# Patient Record
Sex: Female | Born: 1940
Health system: Southern US, Community
[De-identification: ages and names within clinical notes are randomized; demographics above are authoritative.]

## PROBLEM LIST (undated history)

## (undated) DIAGNOSIS — I2510 Atherosclerotic heart disease of native coronary artery without angina pectoris: Secondary | ICD-10-CM

## (undated) DIAGNOSIS — C801 Malignant (primary) neoplasm, unspecified: Secondary | ICD-10-CM

## (undated) DIAGNOSIS — M719 Bursopathy, unspecified: Secondary | ICD-10-CM

## (undated) DIAGNOSIS — I779 Disorder of arteries and arterioles, unspecified: Secondary | ICD-10-CM

## (undated) DIAGNOSIS — R112 Nausea with vomiting, unspecified: Secondary | ICD-10-CM

## (undated) DIAGNOSIS — I739 Peripheral vascular disease, unspecified: Secondary | ICD-10-CM

## (undated) DIAGNOSIS — E039 Hypothyroidism, unspecified: Secondary | ICD-10-CM

## (undated) DIAGNOSIS — I10 Essential (primary) hypertension: Secondary | ICD-10-CM

## (undated) DIAGNOSIS — Z7189 Other specified counseling: Secondary | ICD-10-CM

## (undated) DIAGNOSIS — E785 Hyperlipidemia, unspecified: Secondary | ICD-10-CM

## (undated) DIAGNOSIS — C799 Secondary malignant neoplasm of unspecified site: Secondary | ICD-10-CM

## (undated) DIAGNOSIS — Z9289 Personal history of other medical treatment: Secondary | ICD-10-CM

## (undated) DIAGNOSIS — Z9889 Other specified postprocedural states: Secondary | ICD-10-CM

## (undated) DIAGNOSIS — C539 Malignant neoplasm of cervix uteri, unspecified: Secondary | ICD-10-CM

## (undated) DIAGNOSIS — J45909 Unspecified asthma, uncomplicated: Secondary | ICD-10-CM

## (undated) DIAGNOSIS — M1990 Unspecified osteoarthritis, unspecified site: Secondary | ICD-10-CM

## (undated) HISTORY — DX: Hyperlipidemia, unspecified: E78.5

## (undated) HISTORY — DX: Essential (primary) hypertension: I10

## (undated) HISTORY — DX: Disorder of arteries and arterioles, unspecified: I77.9

## (undated) HISTORY — PX: TUBAL LIGATION: SHX77

## (undated) HISTORY — DX: Atherosclerotic heart disease of native coronary artery without angina pectoris: I25.10

## (undated) HISTORY — DX: Peripheral vascular disease, unspecified: I73.9

## (undated) HISTORY — PX: ABDOMINAL HYSTERECTOMY: SHX81

## (undated) HISTORY — DX: Personal history of other medical treatment: Z92.89

## (undated) HISTORY — PX: TONSILLECTOMY: SUR1361

---

## 1997-08-28 ENCOUNTER — Ambulatory Visit (HOSPITAL_COMMUNITY): Admission: RE | Admit: 1997-08-28 | Discharge: 1997-08-28 | Payer: Self-pay | Admitting: Internal Medicine

## 1999-08-05 ENCOUNTER — Ambulatory Visit (HOSPITAL_COMMUNITY): Admission: RE | Admit: 1999-08-05 | Discharge: 1999-08-05 | Payer: Self-pay | Admitting: Internal Medicine

## 2000-11-25 ENCOUNTER — Ambulatory Visit (HOSPITAL_COMMUNITY): Admission: RE | Admit: 2000-11-25 | Discharge: 2000-11-25 | Payer: Self-pay | Admitting: Gastroenterology

## 2001-01-17 ENCOUNTER — Encounter: Payer: Self-pay | Admitting: Internal Medicine

## 2001-01-17 ENCOUNTER — Ambulatory Visit (HOSPITAL_COMMUNITY): Admission: RE | Admit: 2001-01-17 | Discharge: 2001-01-17 | Payer: Self-pay | Admitting: Internal Medicine

## 2002-01-09 ENCOUNTER — Other Ambulatory Visit: Admission: RE | Admit: 2002-01-09 | Discharge: 2002-01-09 | Payer: Self-pay | Admitting: Obstetrics and Gynecology

## 2002-02-07 ENCOUNTER — Encounter: Payer: Self-pay | Admitting: Emergency Medicine

## 2002-02-07 ENCOUNTER — Emergency Department (HOSPITAL_COMMUNITY): Admission: EM | Admit: 2002-02-07 | Discharge: 2002-02-07 | Payer: Self-pay | Admitting: Emergency Medicine

## 2004-02-21 ENCOUNTER — Ambulatory Visit (HOSPITAL_COMMUNITY): Admission: RE | Admit: 2004-02-21 | Discharge: 2004-02-21 | Payer: Self-pay | Admitting: Gastroenterology

## 2004-03-12 ENCOUNTER — Inpatient Hospital Stay (HOSPITAL_COMMUNITY): Admission: RE | Admit: 2004-03-12 | Discharge: 2004-03-19 | Payer: Self-pay | Admitting: Cardiothoracic Surgery

## 2004-03-12 HISTORY — PX: CORONARY ARTERY BYPASS GRAFT: SHX141

## 2004-03-13 ENCOUNTER — Encounter (INDEPENDENT_AMBULATORY_CARE_PROVIDER_SITE_OTHER): Payer: Self-pay | Admitting: Cardiology

## 2004-03-22 ENCOUNTER — Inpatient Hospital Stay (HOSPITAL_COMMUNITY): Admission: EM | Admit: 2004-03-22 | Discharge: 2004-03-28 | Payer: Self-pay | Admitting: Emergency Medicine

## 2004-04-20 ENCOUNTER — Encounter (HOSPITAL_COMMUNITY): Admission: RE | Admit: 2004-04-20 | Discharge: 2004-07-19 | Payer: Self-pay | Admitting: Cardiology

## 2004-06-24 ENCOUNTER — Ambulatory Visit (HOSPITAL_COMMUNITY): Admission: RE | Admit: 2004-06-24 | Discharge: 2004-06-24 | Payer: Self-pay | Admitting: Gastroenterology

## 2004-06-24 ENCOUNTER — Encounter (INDEPENDENT_AMBULATORY_CARE_PROVIDER_SITE_OTHER): Payer: Self-pay | Admitting: Specialist

## 2007-04-01 HISTORY — PX: TRANSTHORACIC ECHOCARDIOGRAM: SHX275

## 2009-05-07 ENCOUNTER — Encounter: Payer: Self-pay | Admitting: Internal Medicine

## 2009-05-07 ENCOUNTER — Ambulatory Visit: Payer: Self-pay | Admitting: Vascular Surgery

## 2009-05-07 ENCOUNTER — Ambulatory Visit (HOSPITAL_COMMUNITY): Admission: RE | Admit: 2009-05-07 | Discharge: 2009-05-07 | Payer: Self-pay | Admitting: Internal Medicine

## 2009-06-02 ENCOUNTER — Emergency Department (HOSPITAL_COMMUNITY): Admission: EM | Admit: 2009-06-02 | Discharge: 2009-06-02 | Payer: Self-pay | Admitting: Family Medicine

## 2010-03-14 ENCOUNTER — Ambulatory Visit: Payer: Self-pay | Admitting: Vascular Surgery

## 2010-03-14 ENCOUNTER — Emergency Department (HOSPITAL_COMMUNITY): Admission: EM | Admit: 2010-03-14 | Discharge: 2010-03-14 | Payer: Self-pay | Admitting: Emergency Medicine

## 2010-06-03 ENCOUNTER — Ambulatory Visit (HOSPITAL_COMMUNITY)
Admission: RE | Admit: 2010-06-03 | Discharge: 2010-06-03 | Payer: Self-pay | Source: Home / Self Care | Attending: Internal Medicine | Admitting: Internal Medicine

## 2010-06-20 ENCOUNTER — Encounter: Payer: Self-pay | Admitting: Cardiothoracic Surgery

## 2010-10-05 ENCOUNTER — Encounter: Payer: Self-pay | Admitting: Internal Medicine

## 2010-10-05 ENCOUNTER — Other Ambulatory Visit: Payer: Self-pay | Admitting: Internal Medicine

## 2010-10-05 ENCOUNTER — Encounter: Payer: Self-pay | Admitting: *Deleted

## 2010-10-05 DIAGNOSIS — K209 Esophagitis, unspecified without bleeding: Secondary | ICD-10-CM | POA: Insufficient documentation

## 2010-10-05 DIAGNOSIS — K2940 Chronic atrophic gastritis without bleeding: Secondary | ICD-10-CM | POA: Insufficient documentation

## 2010-10-05 DIAGNOSIS — E039 Hypothyroidism, unspecified: Secondary | ICD-10-CM | POA: Insufficient documentation

## 2010-10-05 DIAGNOSIS — R51 Headache: Secondary | ICD-10-CM

## 2010-10-05 DIAGNOSIS — I10 Essential (primary) hypertension: Secondary | ICD-10-CM | POA: Insufficient documentation

## 2010-10-05 DIAGNOSIS — T1490XA Injury, unspecified, initial encounter: Secondary | ICD-10-CM

## 2010-10-06 ENCOUNTER — Other Ambulatory Visit: Payer: Self-pay | Admitting: Internal Medicine

## 2010-10-06 ENCOUNTER — Ambulatory Visit (HOSPITAL_COMMUNITY)
Admission: RE | Admit: 2010-10-06 | Discharge: 2010-10-06 | Disposition: A | Discharge status: Home / Self Care | Payer: Medicare Other | Source: Ambulatory Visit | Attending: Internal Medicine | Admitting: Internal Medicine

## 2010-10-06 DIAGNOSIS — R51 Headache: Secondary | ICD-10-CM

## 2010-10-06 DIAGNOSIS — G319 Degenerative disease of nervous system, unspecified: Secondary | ICD-10-CM | POA: Insufficient documentation

## 2010-10-06 DIAGNOSIS — T1490XA Injury, unspecified, initial encounter: Secondary | ICD-10-CM

## 2010-10-06 DIAGNOSIS — W19XXXA Unspecified fall, initial encounter: Secondary | ICD-10-CM | POA: Insufficient documentation

## 2010-10-06 DIAGNOSIS — S0003XA Contusion of scalp, initial encounter: Secondary | ICD-10-CM | POA: Insufficient documentation

## 2010-10-06 DIAGNOSIS — S0083XA Contusion of other part of head, initial encounter: Secondary | ICD-10-CM | POA: Insufficient documentation

## 2010-10-16 NOTE — Discharge Summary (Signed)
Shannon Scott, Shannon Scott             ACCOUNT NO.:  [id number]   MEDICAL RECORD NO.:  [id number]          PATIENT TYPE:  INP   LOCATION:  2008                         FACILITY:  [facility]   PHYSICIAN:  Octavia Heir, MD  DATE OF BIRTH:  August 13, 1940   DATE OF ADMISSION:  03/22/2004  DATE OF DISCHARGE:  03/28/2004                                 DISCHARGE SUMMARY   ADMISSION DIAGNOSES:  1.  Chest pain with shortness of breath, status post coronary artery bypass      grafting x 3 on March 05, 2004.  2.  Rule out postoperative pericarditis.  3.  Hyperlipidemia.  4.  Hypothyroidism.  5.  Hypokalemia.  6.  Hypertension.   DISCHARGE DIAGNOSES:  1.  Chest pain with shortness of breath, status post coronary artery bypass      grafting x 3 on March 05, 2004.  2.  Rule out postoperative pericarditis.  3.  Hyperlipidemia.  4.  Hypothyroidism.  5.  Hypokalemia.  6.  Hypertension.   PROCEDURE:  Cardiac catheterization March 24, 2004.   CONSULTATIONS:  Nelwyn Salisbury, M.D., GI service.   HISTORY OF PRESENT ILLNESS:  The patient is a 70 year old white female who  underwent coronary artery bypass grafting x 3 with left internal mammary  artery to the LAD, saphenous vein graft to the OM, and saphenous vein graft  to the PD.  She had postoperative atrial fibrillation which resolved with  beta blockers.  Postoperative course showed some ST elevation and some  pericarditis.  She was ultimately discharged home on March 12, 2004.   She returned on the day of admission complaining of shortness of breath,  dyspnea on exertion, chest pain, dizziness, and unsteady at home.  In the  ER, her saturations were 97%.  Her blood pressure was stable.  EKG showed  some lateral T-wave changes.  She was admitted to rule out MI and further  treatment as indicated.   MEDICATIONS:  1.  Aspirin 325 mg q.d.  2.  Toprol XL 25 mg q.d.  3.  Crestor 10 mg q.d.  4.  Protonix 40 mg q.d.  5.  Synthroid 25 mcg  q.d.  6.  Welchol 2 tablets b.i.d.  7.  Ultram p.r.n.   ALLERGIES:  None.   SOCIAL HISTORY:  She is widowed.  One son.  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  She  does not use tobacco or alcohol.  Further history and physical, please see  the dictated note.   HOSPITAL COURSE:  The patient was admitted.  Her enzymes were negative.  Her  labs were stable except for some hypokalemia and that was replaced.  Because  she had T-wave changes, she underwent cardiac catheterization on March 24, 2004.  This showed all the grafts including left internal mammary artery to  the LAD, saphenous vein graft to the circumflex, and saphenous vein graft to  the PD were patent.  EF was 60%.   The patient made slow progress.  She had good deal of GI upset and was seen  in consultation by Dr. Nelwyn Salisbury.  It was ultimately determined she  was constipated.  She had previously undergone abdominal ultrasound and HIDA  scan in September which were negative.  Her constipation was resolved.  Dr.  Nelwyn Salisbury recommended increasing her fiber intake and increased her  Protonix to 40 mg b.i.d.  She also recommended Metamucil, Benefiber, or  Citrucel, along with liberal fluid intake.   By the morning of March 28, 2004, she was doing well.  Her GI symptoms had  pretty much resolved.  She was concern about her appetite but was encouraged  to eat whatever she felt like, push proteins, and that her appetite would  improve with time.  She was seen in the morning of March 28, 2004 by Dr.  Gatha Mayer and Dr. Leslye Peer.  They were in agreement.  She was  subsequently discharged home.   DISCHARGE MEDICATIONS:  1.  Aspirin 81 mg q.d.  2.  Toprol XL 25 mg b.i.d.  3.  Crestor 10 mg q.d.  4.  Protonix 40 mg b.i.d.  5.  Synthroid 0.25 mg q.d.  6.  Welchol 2 tablets b.i.d.  7.  Benicar 20 mg q.d.   FOLLOW UP:  She will return on April 09, 2004 to see Dr. Eden Lathe. [place] at  3:45.  Follow up with Dr. Nelwyn Salisbury to be scheduled.   LABORATORY DATA:  Hemoglobin is 7.5, hematocrit is 30, white count is 6.2,  platelets are 500,000.  Sodium is 140, potassium is 3.6, chloride is 107,  CO2 is 25, BUN 4, creatinine is 0.8, glucose is 0.7.  Troponin, CK and CK-  MBs were negative.  Amylase was 37, lipase was 26.  LFTs were normal.   CONDITION ON DISCHARGE:  Improved.      Will   WDJ/MEDQ  D:  03/28/2004  T:  03/28/2004  Job:  [id number]   cc:   Nelwyn Salisbury, M.D.  [address]  [address] [place] [address]  Fax: [phone]   Len Childs, M.D.  [address]  [place]  [place] [address]   Jeanann Lewandowsky, M.D.  [address], [place] [address]  Fax: [phone]   Eden Lathe. Einar Gip, [place] [address] [place]  [place] [address]  Fax: [phone]

## 2010-10-16 NOTE — Procedures (Signed)
[place]. [facility]  Patient:    Shannon Scott, Shannon Scott                    MRN: [id number] Proc. Date: 11/25/00 Adm. Date:  [id number] Attending:  Juanita Craver CC:         Don Broach. Carlis Abbott, M.D.   Procedure Report  DATE OF BIRTH: 09/10/1940  PROCEDURE: Colonoscopy.  ENDOSCOPIST: Nelwyn Salisbury, M.D.  INSTRUMENT USED: Olympus video colonoscope.  INDICATIONS FOR PROCEDURE: Trace guaiac positive stools in a 70 year old female.  Rule out colonic polyps, masses, hemorrhoids, etc.  PREPROCEDURE PREPARATION: Informed consent was procured from the patient and the patient was fasted for eight hours prior to the procedure, and prepped with a bottle of magnesium citrate and a gallon of NuLytely the night prior to the procedure.  PREPROCEDURE PHYSICAL EXAMINATION:  VITAL SIGNS: Stable.  NECK: Supple.  CHEST: Clear to auscultation.  CARDIAC: S1 and S2, regular.  No murmurs, rubs, or gallops.  ABDOMEN Soft, with normal bowel sounds.  Epigastric tenderness on palpation with guarding.  No rebound or rigidity.  DESCRIPTION OF PROCEDURE: The patient was placed in the left lateral decubitus position and sedated with Demerol and Versed for the EGD.  No additional sedation was used for colonoscopy.  Once the patient was adequately sedated and maintained on low-flow oxygen and continuous cardiac monitoring the Olympus video colonoscope was advanced from the rectum to the cecum without difficulty.  The appendiceal orifice and ileocecal valve were clearly visualized and photographed.  Small internal hemorrhoids were appreciated on retroflexion in the rectum.  There was some early left-sided diverticulosis. No polyps or masses were seen.  IMPRESSION:  1. Small nonbleeding internal hemorrhoids.  2. Early left-sided diverticular disease.  RECOMMENDATIONS:  1. High-fiber diet has been recommended with liberal fluid intake.  2. Repeat guaiac testing will be done on  an outpatient basis.  3. Further recommendations will be made as warranted. DD:  11/25/00 TD:  11/26/00 Job: [id number]

## 2010-10-16 NOTE — Op Note (Signed)
NAMENATALYAH, CUMMISKEY NO.:  [id number]   MEDICAL RECORD NO.:  [id number]          PATIENT TYPE:  INP   LOCATION:  2303                         FACILITY:  [facility]   PHYSICIAN:  Tharon Aquas Trigt III, M.D.DATE OF BIRTH:  May 15, 1941   DATE OF PROCEDURE:  03/12/2004  DATE OF DISCHARGE:                                 OPERATIVE REPORT   PREOPERATIVE DIAGNOSIS:  Class 3 exertional angina with severe three-vessel  coronary artery disease.   POSTOPERATIVE DIAGNOSIS:  Class 3 exertional angina with severe three-vessel  coronary artery disease.   OPERATION PERFORMED:  Coronary artery bypass grafting times three (left  internal mammary artery to the left anterior descending, saphenous vein  graft to circumflex marginal, saphenous vein graft to posterior descending)   SURGEON:  Ivin Poot, M.D.   ASSISTANT:  Jacinta Shoe, P.A.   ANESTHESIA:  General.   ANESTHESIOLOGIST:  Finis Bud, M.D.   INDICATIONS FOR PROCEDURE:  The patient is a 70 year old female with  exertional shortness of breath and chest tightness.  Although she had a  normal Cardiolite stress test two years ago, it was felt her symptoms were  consistent with angina and she had significant risk factors including  hyperlipidemia, obesity and family history.  Dr. Einar Gip performed diagnostic  cardiac catheterization which demonstrated severe three-vessel disease with  preserved left ventricular function.  She is felt to be a candidate for  surgical revascularization.   Prior to surgery, I examined the patient in the office and reviewed the  results of the cardiac catheterization with the patient and her family.  I  discussed the indications and expected benefits of coronary artery bypass  grafting surgery for treatment of her coronary artery disease.  I reviewed  the alternatives to surgical therapy for treatment of her coronary artery  disease and the expected outcome of those alternative  therapies.  I reviewed  with the patient the major aspects of the planned procedure including the  choice of conduit to include the mammary artery and saphenous vein, the  location of the surgical incisions, the use of general anesthesia and  cardiopulmonary bypass and the expected postoperative hospital recovery.  I  reviewed with the patient the risks to her of coronary artery bypass surgery  including the risks of MI, CVA, bleeding, wound infection, and death.  She  understood these implications for the surgery and agreed to proceed with the  operation as planned under what I felt was an informed consent.   OPERATIVE FINDINGS:  The patient's coronaries were very small.  She would  not be a candidate for redo bypass surgery.  The mammary artery was very  small and was trimmed proximally in order to provide a conduit with adequate  size and blood flow.  The patient was given one unit of packed cells during  the pump run for a hemoglobin of 7 g.   DESCRIPTION OF PROCEDURE:  The patient was brought to the operating room and  placed supine on the operating table where general anesthesia was induced  under invasive hemodynamic monitoring.  The chest, abdomen and legs  were  prepped with Betadine and draped as a sterile field.  A sternal incision was  made as the saphenous vein was harvested from the right thigh using the  endoscopic technique.  The left internal mammary artery was harvested as a  pedicle graft from its origin at the subclavian vessels and was a good  vessel but small with excellent flow.  Heparin was administered and ACT was  documented as being therapeutic.  The sternal retractor was placed.  The  pericardium was opened and suspended.  Pursestrings were placed in the  ascending aorta and right atrium and the patient was cannulated and placed  on bypass.  The coronaries were identified for grafting.  The distal right  coronary was too heavily diseased to graft.  The posterior  descending was  small but graftable.  The posterolateral vessel was too small to graft.  The  LAD was intramyocardial except for its distal most aspect where it was  adequate size for grafting.  The diagonal was too small to graft.  The  circumflex marginal was an adequate target 1.5 mm.  The mammary artery and  vein grafts were prepared for the distal anastomoses and a cardioplegia  catheter was placed in the ascending aorta.  The patient was cooled to 32  degrees.  The aortic cross-clamp was applied.  800 mL of cold blood  cardioplegia was delivered into the aorta and there was good cardioplegic  arrest with septal temperature dropping less than 15 degrees.  Topical iced  saline was used to augment myocardial preservation and a pericardial  insulator pad was used to protect the left phrenic nerve.  The distal  coronary anastomoses were then performed.  The first distal anastomosis was  to the posterior descending.  This was a 1.4 mm vessel with proximal 90%  stenosis.  A reversed saphenous vein was sewn end-to-side with running 7-0  Prolene with a good flow through the graft.  The second distal anastomosis  was to the circumflex marginal.  This was a 1.5 mm vessel with  proximal 90%  stenosis.  A reversed saphenous vein was sewn end-to-side with running 8-0  Prolene and there was good flow through the graft.  The third distal  anastomosis was to the distal third of the LAD.  This was a 1.5 mm vessel  with proximal 90% stenosis.  The left IMA pedicle was brought through an  opening created in the left lateral pericardium and was brought down onto  the LAD and sewn end-to-side with running 8-0 Prolene.  There was excellent  flow through the anastomosis after briefly releasing the pedicle clamp on  the mammary artery.  The mammary pedicle was then  reclamped with a vascular  bulldog and secured to the epicardium.  A dose of cardioplegia was redosed. While the aortic cross-clamp was still in  place, two proximal vein  anastomoses were placed on the ascending aorta using a 4.0 mm punch and  running 6-0 Prolene.  The aortic cross-clamp was removed after venting air  from the aorta.  The heart resumed a spontaneous rhythm.  The anastomoses  were examined and found to be hemostatic.  Temporary pacing wires were  applied.  The lung was re-expanding and the ventilator was resumed.  The  patient was weaned from bypass without difficulty, with normal blood  pressure and good cardiac output.  Protamine was administered without  adverse reaction.  The cannulas were removed.  The mediastinum was irrigated  with warm  antibiotic irrigation.  The leg incision was irrigated and closed  in a standard fashion.  The pericardium was loosely reapproximated.  Two  mediastinal and a left pleural chest tube were placed and brought out  through separate incisions.  The sternum was closed with interrupted steel  wire.  The pectoralis fascia was closed with a running #1 Vicryl.  The  subcutaneous layer and skin were closed with a running Vicryl and sterile  dressings were applied.  Total bypass time was 110 minutes with cross-clamp  time of 80 minutes.       PV/MEDQ  D:  03/12/2004  T:  03/12/2004  Job:  [id number]   cc:   Eden Lathe. Einar Gip, [place] [address], [address] [place]  [place] [address]  Fax: [phone]

## 2010-10-16 NOTE — Procedures (Signed)
[place]. [facility]  Patient:    Shannon Scott, FOUCH                    MRN: [id number] Proc. Date: 11/25/00 Adm. Date:  [id number] Attending:  Juanita Craver CC:         Don Broach. Carlis Abbott, M.D.   Procedure Report  DATE OF BIRTH:  1941/02/28  PROCEDURE PERFORMED:  Esophagogastroduodenoscopy.  ENDOSCOPIST:  Nelwyn Salisbury, M.D.  INSTRUMENT USED:  Olympus video panendoscope.  INDICATION FOR PROCEDURE:   A 70 year old female with history of epigastric pain and trace guaiac positive stools.  Rule out peptic ulcer disease, esophagitis, gastritis, etc.  PREPROCEDURE PREPARATION:  Informed consent was procured from the patient. The patient was fasted for 8 hours prior to the procedure.  PREPROCEDURE PHYSICAL:  Patient has stable vital signs.  NECK:  Supple.  CHEST:  Clear to auscultation. S1, S2 regular.  ABDOMEN:  Soft with normal bowel sounds, epigastric tenderness on palpation, no guarding, rebound or rigidity, no hepatosplenomegaly appreciated.  DESCRIPTION OF PROCEDURE:  The patient was placed in the left lateral decubitus position and sedated with 70 mg of Demerol and 7 mg of Versed intravenously.  Once the patient was adequately sedated and maintained on low-flow oxygen and continuous cardiac monitoring, the Olympus video panendoscope was advanced through the mouth piece, over the tongue into the esophagus under direct vision.  The entire esophagus appeared normal without evidence of rings, strictures, masses, lesions, esophagitis or Barretts mucosa.  The scope was then advanced into the stomach.  There was mild antral erythema.  No frank ulcers, erosions, masses or polyps were seen.  The proximal small bowel also appeared normal.  IMPRESSION:  Normal esophagogastroduodenoscopy, except for mild antral erythema.  RECOMMENDATIONS: 1. Proceed with colonoscopy at this time. 2. Consider further evaluation if the abdominal pain persists. 3. Avoid  all nonsteroidals. DD:  11/25/00 TD:  11/26/00 Job: [id number]

## 2010-10-16 NOTE — Consult Note (Signed)
Shannon Scott, Shannon Scott             ACCOUNT NO.:  [id number]   MEDICAL RECORD NO.:  [id number]          PATIENT TYPE:  INP   LOCATION:  2008                         FACILITY:  [facility]   PHYSICIAN:  Nelwyn Salisbury, M.D.  DATE OF BIRTH:  1940/06/03   DATE OF CONSULTATION:  03/27/2004  DATE OF DISCHARGE:                                   CONSULTATION   REASON FOR CONSULTATION:  1.  Severe nausea and recurrent constipation probably secondary to pain      medications.  2.  Gastroesophageal reflux disease on Protonix.  3.  Status post coronary artery bypass graft on March 12, 2004.  4.  Hyperlipidemia on Zocor and Welchol.  5.  Hypertension on enalapril.  6.  Hypothyroidism on supplements.  7.  Multiple pain medications secondary to chest discomfort after      sternotomy.   RECOMMENDATIONS:  1.  Decrease aspirin to 81 mg (enteric-coated aspirin) per day.  2.  Increase Protonix to 40 mg b.i.d.  3.  Discontinue all pain medications.  4.  Discontinue Senna.  5.  Use Colace 100-200 mg on a daily basis at bedtime.  6.  Gastric emptying study if symptoms are not improved with the above      interventions.  7.  Will hold off on doing an EGD unless absolutely necessary if the above      tests are negative.   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  In the process of her work-up she informed me about  recurring chest pain and left arm pain and work-up revealed severe coronary  artery disease which led to a CABG done on March 12, 2004 by Dr. Prescott Gum.  Patient was discharged home without major incident until she was  readmitted on March 22, 2004 with postprandial nausea and weakness.  Patient had been taking pain medications q.4h. and had severe constipation.  She had used magnesium citrate the night before with minimal results.  Her  symptoms seemed to improve on  admission but for the last three to four days  even the smell of food made her nauseous.  She denies problems with  vomiting, hematochezia, melena, or coffee ground emesis.  There is no  history of fever, chills, or rigors associated with her symptoms.  She does  have mild reflux for which she takes Protonix, but denies dysphagia,  odynophagia.  Her appetite has been poor.  Her weight has been stable.  Her  constipation is somewhat improved since admission.  She has been receiving  multiple pain medications; however, during this hospitalization as well.   PAST MEDICAL HISTORY:  See list above.   MEDICATIONS:  1.  Aspirin 325 mg daily.  2.  Altace 2.5 mg daily.  3.  Colace 200 mg p.o. daily.  4.  Zocor 40 mg p.o. daily.  5.  Synthroid 25 mcg p.o. daily.  6.  Protonix 40 mg p.o. daily.  7.  Welchol 1250 mg p.o. daily.  8.  Lasix 20 mg p.o. daily.  9.  Dulcolax suppositories as needed.  10. Toprol XL 50 mg p.o. daily.  11. Nitroglycerin p.r.n.  12. Senna p.r.n.  13. Tylenol p.r.n.  14. Morphine sulfate p.r.n.  15. Ultram p.r.n.  16. Benadryl p.r.n.  17. Diazepam p.r.n.  18. Phenergan p.r.n.   SOCIAL HISTORY:  She is a widow.  She lives with her daughter in [place],  [place].  Denies the use of alcohol, tobacco, or drugs.  She is a  Marine scientist at [employer].   FAMILY HISTORY:  There is a history of stroke associated with her father,  heart disease with her mother.  There is no known family history of breast,  ovarian, uterine, or colon cancer.   PHYSICAL EXAMINATION:  GENERAL:  Pleasant, middle-aged African-American  female in no acute distress.  VITAL SIGNS:  Stable.  Temperature 97.4, blood pressure 140/70, pulse 86 per  minute, respiratory rate 16.  HEENT:  PERRLA.  Face is symmetrical.  Oropharyngeal mucosa without exudate.  NECK:  Supple.  CHEST:  Clear to auscultation.  No rales, rhonchi, wheezing is heard.  HEART:  S1, S2 regular.  There is a midline  sternotomy scar present.  ABDOMEN:  Soft, nontender with normal abdominal bowel sounds.  No  hepatosplenomegaly.  No masses palpable.  RECTAL:  Moderate sphincter tone with guaiac-negative stool.  No other  masses palpable.   LABORATORIES:  Hemoglobin 10.5, hematocrit 30, white count 6.2, platelet  count 500,000.  Sodium 137, potassium slightly low at 3.3 which is being  replaced, chloride 103, CO2 25, BUN 2, creatinine 0.7, glucose 93.  She had  a repeat catheterization on this admission which revealed patent vessels and  no acute abnormalities.   PLAN:  As above.  Further recommendation will be made in follow-up.  Patient  has had an abdominal ultrasound and HIDA scan in September this year that  were reported within normal limits.       JNM/MEDQ  D:  03/27/2004  T:  03/27/2004  Job:  [id number]   cc:   Eden Lathe. Einar Gip, [place] [address] [place] [address]  Fax: [phone]   Jeanann Lewandowsky, M.D.  [address], [place]  [place] [address]  Fax: [phone]

## 2010-10-16 NOTE — Op Note (Signed)
Shannon Scott, Shannon Scott             ACCOUNT NO.:  [id number]   MEDICAL RECORD NO.:  [id number]          PATIENT TYPE:  AMB   LOCATION:  ENDO                         FACILITY:  [facility]   PHYSICIAN:  Nelwyn Salisbury, M.D.  DATE OF BIRTH:  06-16-1940   DATE OF PROCEDURE:  06/24/2004  DATE OF DISCHARGE:                                 OPERATIVE REPORT   PROCEDURE:  Colonoscopy with cold biopsies x6.   ENDOSCOPIST:  Nelwyn Salisbury, M.D.   INSTRUMENT USED:  Olympus video colonoscope.   INDICATIONS FOR PROCEDURE:  A 70 year old female with a history of rectal  bleeding in the recent past.  Rule out colonic polyps, masses, hemorrhoids,  etc.  The patient has a history of heart disease, rule out ischemia.   PREPROCEDURE PREPARATION:  Informed consent was procured from the patient.  The patient fasted for eight hours prior to the procedure and prepped with a  bottle of magnesium citrate and a gallon of GoLYTELY the night prior to the  procedure.   PREPROCEDURE PHYSICAL:  The patient had stable vital signs. Neck supple.  Chest clear to auscultation. S1, S2 regular. Abdomen soft with normal bowel  sounds.   DESCRIPTION OF PROCEDURE:  The patient was placed in the left lateral  decubitus position and sedated with 100 mg of Demerol and 10 mg of Versed in  slow incremental doses. Once the patient was adequately sedated and  maintained on low flow oxygen and continuous cardiac monitoring, the Olympus  video colonoscope was advanced from the rectum to the cecum. The appendiceal  orifice and ileocecal valve were visualized and photographed. Multiple  washings were done. The patient's position was changed from the left lateral  supine and right lateral position and then the prone position to reach the  cecal base after gentle application of abdominal pressure.  Sigmoid  diverticulosis was noted, ischemic changes were noted in the left colon.  Biopsies were done, retroflexion in the rectum revealed  no abnormalities.  The patient tolerated the procedure well without immediate complications.   IMPRESSION:  1.  Sigmoid diverticulosis.  2.  Ischemic changes in the left colon otherwise unrevealing colonoscopy.   RECOMMENDATIONS:  1.  Continue on high fiber diet with liberal fluid intake.  2.  Await pathology results.  3.  Outpatient followup in the next two weeks for further recommendations.      JNM/MEDQ  D:  06/24/2004  T:  06/24/2004  Job:  [id number]   cc:   Jeanann Lewandowsky, M.D.  [address] [place] [address]  Fax: [phone]   Eden Lathe. Einar Gip, [place] [address], [address] [place]  [place] [address]  Fax: [phone]

## 2010-10-16 NOTE — Cardiovascular Report (Signed)
NAME:  Shannon Scott, Shannon Scott NO.:  [id number]   MEDICAL RECORD NO.:  [id number]          PATIENT TYPE:  REC   LOCATION:  REHS                         FACILITY:  [facility]   PHYSICIAN:  Shelva Majestic, M.D.     DATE OF BIRTH:  [birth year]   DATE OF PROCEDURE:  03/24/2004  DATE OF DISCHARGE:                              CARDIAC CATHETERIZATION   REFERRING PHYSICIAN:  Jeanann Lewandowsky, M.D.   INDICATIONS:  Ms. Trevor is a 70 year old female patient who is status  post prior CABG revascularization surgery with LIMA to the LAD, vein to the  OM and vein to the PD.  This had been done on March 12, 2004.  The patient  was recently readmitted with recurrent chest pain.  She was seen by Dr.  Tami Ribas and it was felt that her pain was somewhat similar to angina  symptomatology.  Repeat catheterization was recommended.   DESCRIPTION OF PROCEDURE:  After premedication with Valium intravenously,  the patient was prepped and draped in the usual fashion.  The right femoral  artery was punctured anteriorly and a 5 French sheath was inserted.  Diagnostic catheterization was done with 5 French Judkins 4 left and right  coronary catheters as well as left internal mammary artery catheter.  Pigtail catheter was used for biplane cine left ventriculography.  The  patient tolerated the procedure well and returned to her range of motion in  satisfactory condition.   HEMODYNAMIC DATA:  Central aortic pressure 138/67, mean 97.  Left  ventricular pressure 138/16.   ANGIOGRAPHIC DATA:  Left main coronary artery was a normal vessel which  bifurcated into the LAD and left circumflex system.  There was proximal LAD  narrowing of 40 to 50% before the first diagonal vessel.  The LAD after the  first major diagonal but actually the third diagonal vessel was 80% stenosed  in the region of septal perforator and takeoff.  There was competitive  filling seen of LIMA flow.   The circumflex vessel had 30%  narrowing proximally and this was followed by  an area of ectasia before the OM1 takeoff.   The right coronary artery was ectatic proximally.  It was totally occluded.   The vein graft to the right coronary artery was an exceptionally large graft  which anastomosed into a very small PDA-like system.  There was no obvious  focal stenoses.   The vein grafts applied to the circumflex marginal vessel was moderate size  and there was evidence for very apparent valve with somewhat mild narrowing  in the mid portion of the graft.  There did not appear to be any overt  stenosis in regions other than where this valve was located.   The left internal mammary artery was widely patent and anastomosed into the  mid LAD.   Biplane cine left ventriculography revealed concentric left ventricular  hypertrophy.  Ejection fraction was 60%.  On the RAO projection,  contractility appeared fairly normal.  On the LAO projection there was a  small region of subtle upper septal hypocontractility which most likely is  due to the stenosis also jeopardizing  septal flow.   IMPRESSION:  1.  Normal left ventricular systolic function with concentric left      ventricular hypertrophy and mild upper septal hypocontractility.  2.  Significant native coronary artery disease with 80% stenosis in the left      anterior descending, 30% proximal circumflex stenosis followed by area      of ectasia and total occlusion of the right coronary artery.  3.  Patent left internal mammary artery to the left anterior descending.  4.  Patent vein graft to the circumflex marginal vessel with very prominent      valve in the mid body of the graft.  5.  Very large patent vein graft with significant disparity between the      large graft and small posterior descending artery system.   RECOMMENDATIONS:  Medical therapy.       TK/MEDQ  D:  06/12/2004  T:  06/12/2004  Job:  [id number]   cc:   Octavia Heir, MD  [phone] [address]  [place] [address]  Fax: [phone]   Jeanann Lewandowsky, M.D.  [address], [place]  [place] [address]  Fax: [phone]

## 2010-10-16 NOTE — Discharge Summary (Signed)
NAME:  Shannon, Scott NO.:  [id number]   MEDICAL RECORD NO.:  [id number]          PATIENT TYPE:  INP   LOCATION:  2004                         FACILITY:  [facility]   PHYSICIAN:  Ivin Poot, M.D.  DATE OF BIRTH:  1940/11/02   DATE OF ADMISSION:  03/12/2004  DATE OF DISCHARGE:  03/19/2004                                 DISCHARGE SUMMARY   CARDIOLOGIST:  Dr. Adrian Prows   PRIMARY CARE PHYSICIAN:  Dr. Jeanann Lewandowsky   ADMITTING DIAGNOSES:  Severe three-vessel coronary artery disease with  preserved left ventricular function with class III exertional angina.   DISCHARGE/SECONDARY DIAGNOSES:  1.  Severe three vessel coronary artery disease with preserved left      ventricular function with class III exertional angina.  2.  Postoperative pericarditis.  3.  Postoperative anemia status post transfusion.  4.  Postoperative bronchitis with preoperative positive urinalysis status      post treatment with Cipro.  5.  Hypothyroidism.  6.  Hyperlipidemia.  7.  Hypertension.  8.  History of tubal ligation.  9.  Postoperative paroxysmal supraventricular tachycardia.  10. Postoperative ____________, resolved on beta blocker.  11. Postoperative hyperglycemia, improved, initially requiring low-dose      Lantus.   ALLERGIES:  No known drug allergies.   PROCEDURES:  1.  Coronary artery bypass grafting x3 using a left internal mammary artery      to a left anterior descending, saphenous vein graft to the circumflex      marginal, saphenous vein graft to the posterior descending.  Endoscopic      vein harvesting from the right thigh.  Surgeon was Dr. Tharon Aquas Trigt.      Date was March 12, 2004.  2.  On March 13, 2004 she underwent a 2-D echocardiogram by Dr. Adrian Prows      showing no pericardial effusion.  Left ventricular ejection fraction was      estimated between 65-75%.  Aortic valve thickness was mildly increased      and mildly calcified.   BRIEF HISTORY:  [name] is a 70 year old Caucasian female who was seen  by Dr. Tharon Aquas Trigt on March 06, 2004 for evaluation of surgical  coronary revascularization for recently diagnosed severe three vessel  coronary artery disease.  The patient had noticed a significant decline in  her exercise tolerance, dyspnea on exertion, and easy fatigability while  doing her routine activities which included working as a Marine scientist at [employer].  She also had some chest tightness which was relieved by rest.  Cardiac catheterization was performed on March 05, 2004 by Dr. Einar Gip which  demonstrated severe three vessel disease with an ejection fraction of 60%.  Her left ventricular end-diastolic pressure was 8 mmHg.  The LAD diagonal  had 80% stenosis.  The right coronary had a 90% stenosis in the mid segment  and a 95% stenosis distally.  The circumflex marginal had an 80% stenosis  and there was a stenosis of the small to medium sized diagonal branch of the  LAD.  Based on her coronary anatomy, her  symptoms, and her risk factors  including an LDL greater than 300, she was felt to be a candidate for  surgical revascularization.  Surgery was tentatively scheduled for March 12, 2004.  Of note, on her visit to see Dr. Prescott Gum she was noted to have  symptoms of an upper respiratory infection or allergies for the past two  days.  She had a constant cough which was somewhat productive with sometimes  yellow sputum and upper airway congestion.  She was treated for symptoms of  bronchitis with a Z-pack and Pulmicort inhaler and albuterol MDI.   HOSPITAL COURSE:  As anticipated, Shannon Scott was electively admitted to  [facility] on March 12, 2004 and did undergo coronary artery  bypass grafting as discussed above.  Overall, she tolerated the procedure  well, but did require 1 unit of packed cells for hemoglobin of 7.2.  She was  transferred to the surgical intensive care unit in stable condition.   By  later that evening she had been extubated and was neurologically intact.  On  postoperative day one she was hemodynamically stable.  However, she was  noted to have a friction rub on examination.  Her EKG showed diffuse ST  segment changes.  CK was elevated at 358.  CK-MB elevated at 12.5.  Based on  the global ST changes she was felt most likely to have postoperative  pericarditis.  A stat echocardiogram was ordered which did rule out  pericardial effusion.  Otherwise, she was doing well.  She was maintaining  sinus rhythm.  Her chest tubes had minimal output and were discontinued.  Follow-up laboratories showed a decreased hemoglobin of 8.9.  She did  require an additional blood transfusion.  Initial chest x-ray showed some  edema and bibasilar atelectasis.  Her weight also showed evidence of volume  excess and she was treated with gentle diuresis.  She remained in intensive  care unit until postoperative day four.  At that time she was transferred to  unit 2000.  Prior to transfer she had been started on Cipro for productive  cough as well as a preoperative urinalysis showing moderate leukocytes and  few bacteria.  No culture and sensitivity was available.  Once on unit 2000  Shannon Scott progressed well.  On postoperative day five she did have some  nausea and vomiting; however, she did report feelings of constipation.  There is also question if the nausea was related to oxycodone.  For this  reason, she was switched to Ultram.  She was also given a Dulcolax  suppository.  The following day her nausea had subsided.  She had had a  bowel movement and her pain was controlled on the Ultram.  Also of note  while on unit 2000, she was noted to have a burst of atrial fibrillation  which returned immediately to sinus rhythm.  This did resolve once  consistently on a beta blocker.  She was switched to Toprol XL 25 mg daily. Otherwise, her heart rate remained stable.  She remained afebrile  and was  eventually weaned from supplemental oxygen.  She was also mobilizing out in  the hallways with assistive cardiac rehabilitation.  It was felt that if she  continued to progress in this manner she would be ready for discharge by  postoperative day seven, March 19, 2004.   RADIOLOGY:  Chest x-ray on March 17, 2004 showed improvement in aeration  in the bases with small bilateral effusion.  There is  mild cardiomegaly.   LABORATORY DATA:  Laboratories prior to discharge showed sodium 141,  potassium 4.3, blood glucose 93, BUN 6, creatinine 0.7.  Total bilirubin  0.6, alkaline phosphatase 63, SGOT 16, SGPT 19, total protein 5.7, albumin  2.9, calcium 8.9, amylase 50.  White blood count 7.3, hemoglobin 11.2,  hematocrit 31.4, platelet count 328,000.   DISCHARGE MEDICATIONS:  1.  Coated aspirin 81 mg one p.o. daily.  2.  Toprol XL 25 mg one p.o. daily.  3.  Crestor 10 mg one p.o. daily.  4.  Protonix 40 mg one p.o. daily.  5.  Synthroid 25 mcg one p.o. daily.  6.  Welchol two tablets p.o. b.i.d.  7.  Lasix 40 mg one p.o. daily x5 days.  She is to eat foods high in      potassium during the duration of her diuretic therapy.  8.  Ultram 50 mg one to two tablets p.o. q.4-6h. p.r.n. pain.  9.  She is instructed to hold her Benicar and Norvasc until further      instructed by Dr. Einar Gip.   DISCHARGE INSTRUCTIONS:  She was instructed to avoid driving or heavy  lifting more than 10 pounds.  She was encouraged to continue daily walking,  breathing exercises.  She is to follow a low fat, low salt diet.  Prior to  discharge her incisions were without sign of infection.  Once home she may  shower and clean her incisions daily with mild soap and water.  She should  notify the CV[facility] office if she develops fever greater than 101 or redness or  drainage from her incision.   DISCHARGE FOLLOWUP:  1.  She is to follow up with Dr. Prescott Gum in the [place] office on April 03, 2004 at 12:45  p.m.  [facility] this appointment and was instructed to      bring her chest x-ray films with her to her appointment to see Dr. Prescott Gum.  2.  She is to call [phone] to schedule two-week followup with Dr. Einar Gip.  3.  She is to schedule two to four-week followup with Dr. Jeanann Lewandowsky to      follow up her blood sugar levels.       AWZ/MEDQ  D:  03/18/2004  T:  03/18/2004  Job:  [id number]   cc:   Eden Lathe. Einar Gip, [place] [address], [place] [address]  Fax: [phone]   Jeanann Lewandowsky, M.D.  [address] [place]  [place] [address]  Fax: [phone]

## 2012-02-29 HISTORY — PX: OTHER SURGICAL HISTORY: SHX169

## 2012-03-22 ENCOUNTER — Ambulatory Visit (HOSPITAL_COMMUNITY)
Admission: RE | Admit: 2012-03-22 | Discharge: 2012-03-22 | Disposition: A | Discharge status: Home / Self Care | Payer: Medicare Other | Source: Ambulatory Visit | Attending: Internal Medicine | Admitting: Internal Medicine

## 2012-03-22 DIAGNOSIS — I6529 Occlusion and stenosis of unspecified carotid artery: Secondary | ICD-10-CM | POA: Insufficient documentation

## 2012-03-22 DIAGNOSIS — R0989 Other specified symptoms and signs involving the circulatory and respiratory systems: Secondary | ICD-10-CM

## 2012-03-22 NOTE — Progress Notes (Signed)
*  PRELIMINARY RESULTS* Vascular Ultrasound Carotid Duplex (Doppler) has been completed.  Preliminary findings: Right=  40-59% internal carotid artery stenosis.  Left= 60-79% internal carotid artery stenosis.  Bilateral:  Vertebral artery flow is antegrade.      Farrel Demark, RDMS, RVT 03/22/2012, 11:06 AM

## 2012-04-13 ENCOUNTER — Other Ambulatory Visit (HOSPITAL_COMMUNITY): Payer: Self-pay

## 2012-04-13 DIAGNOSIS — R079 Chest pain, unspecified: Secondary | ICD-10-CM

## 2012-04-24 ENCOUNTER — Ambulatory Visit (HOSPITAL_COMMUNITY)
Admission: RE | Admit: 2012-04-24 | Discharge: 2012-04-24 | Disposition: A | Discharge status: Home / Self Care | Payer: Medicare Other | Source: Ambulatory Visit | Attending: Internal Medicine | Admitting: Internal Medicine

## 2012-04-24 DIAGNOSIS — R079 Chest pain, unspecified: Secondary | ICD-10-CM | POA: Insufficient documentation

## 2012-04-24 DIAGNOSIS — Z9289 Personal history of other medical treatment: Secondary | ICD-10-CM

## 2012-04-24 HISTORY — DX: Personal history of other medical treatment: Z92.89

## 2012-04-24 MED ORDER — REGADENOSON 0.4 MG/5ML IV SOLN
0.4000 mg | Freq: Once | INTRAVENOUS | Status: AC
  Administered 2012-04-24: 0.4 mg via INTRAVENOUS

## 2012-04-24 MED ORDER — AMINOPHYLLINE 25 MG/ML IV SOLN
75.0000 mg | Freq: Once | INTRAVENOUS | Status: AC
  Administered 2012-04-24: 75 mg via INTRAVENOUS

## 2012-04-24 MED ORDER — TECHNETIUM TC 99M SESTAMIBI GENERIC - CARDIOLITE
25.3000 | Freq: Once | INTRAVENOUS | Status: AC | PRN
  Administered 2012-04-24: 25 via INTRAVENOUS

## 2012-04-24 MED ORDER — TECHNETIUM TC 99M SESTAMIBI GENERIC - CARDIOLITE
8.3000 | Freq: Once | INTRAVENOUS | Status: AC | PRN
  Administered 2012-04-24: 8 via INTRAVENOUS

## 2012-04-24 NOTE — Procedures (Addendum)
[facility] [address] [place] [address] [phone]  Cardiology Nuclear Med Study  Shannon Scott is a 71 y.o. female     MRN : [id number]     DOB: May 27, 1941  Procedure Date: 04/24/2012  Nuclear Med Background Indication for Stress Test:  Graft Patency History:  GERD Cardiac Risk Factors: Hypertension, Lipids, Overweight and CABG 2005  Symptoms:  Chest Pressure.  (last date of chest discomfort 04/22/2012)   Nuclear Pre-Procedure Caffeine/Decaff Intake:  10:00pm NPO After: 8:00am   IV Site: R Antecubital  IV 0.9% NS with Angio Cath:  22g  Chest Size (in):  n/a IV Started by: Otho Perl, CNMT  Height: 5' (1.524 m)  Cup Size: B  BMI:  Body mass index is 31.25 kg/(m^2). Weight:  160 lb (72.576 kg)   Tech Comments:  n/a    Nuclear Med Study 1 or 2 day study: 1 day  Stress Test Type:  [place] Provider:  Mali Hilty, MD   Resting Radionuclide: Technetium 19mSestamibi  Resting Radionuclide Dose:8.3 mCi   Stress Radionuclide:  Technetium 987mestamibi  Stress Radionuclide Dose:25.3 mCi           Stress Protocol Rest HR: 66 Stress HR:109  Rest BP: 145/71 Stress BP: 162/67  Exercise Time (min): n/a METS: n/a          Dose of Adenosine (mg):  n/a Dose of Lexiscan: 0.4 mg  Dose of Atropine (mg): n/a Dose of Dobutamine: n/a mcg/kg/min (at max HR)  Stress Test Technologist: GwMellody MemosCCT Nuclear Technologist: PaImagene RichesCNMT   Rest Procedure:  Myocardial perfusion imaging was performed at rest 45 minutes following the intravenous administration of Technetium 9930mstamibi. Stress Procedure:  The patient received IV Lexiscan 0.4 mg over 15-seconds.  Technetium 65m85mtamibi injected at 30-seconds.  Patient was given 75 mg of aminophylline during the the first two minute recovery period because of shortness of breath, dizziness and headache.There were no significant changes with  Lexiscan.  Quantitative spect images were obtained after a 45 minute delay.  Transient Ischemic Dilatation (Normal <1.22):  0.90 Lung/Heart Ratio (Normal <0.45):  0.25 QGS EDV:  50 ml QGS ESV:  11 ml LV Ejection Fraction: 78%  Signed by FarrJoellyn Quails on 04/24/2012 at 12:55 PM.      Rest ECG: NSR - Normal EKG  Stress ECG: No significant change from baseline ECG  QPS Raw Data Images:  Normal; no motion artifact; normal heart/lung ratio. Stress Images:  Normal homogeneous uptake in all areas of the myocardium. Rest Images:  Normal homogeneous uptake in all areas of the myocardium. Subtraction (SDS):  Normal  Impression Exercise Capacity:  Lexiscan with no exercise. BP Response:  Normal blood pressure response. Clinical Symptoms:  No significant symptoms noted. ECG Impression:  No significant ST segment change suggestive of ischemia. Comparison with Prior Nuclear Study: No significant change from previous study  Overall Impression:  Normal stress nuclear study.  LV Wall Motion:  NL LV Function; NL Wall Motion   BERRLorretta Harp  04/24/2012 5:29 PM

## 2012-06-21 ENCOUNTER — Other Ambulatory Visit: Payer: Self-pay | Admitting: Internal Medicine

## 2012-06-21 DIAGNOSIS — R55 Syncope and collapse: Secondary | ICD-10-CM

## 2012-06-21 DIAGNOSIS — R413 Other amnesia: Secondary | ICD-10-CM

## 2012-06-21 DIAGNOSIS — I63239 Cerebral infarction due to unspecified occlusion or stenosis of unspecified carotid arteries: Secondary | ICD-10-CM

## 2012-06-27 ENCOUNTER — Ambulatory Visit (HOSPITAL_COMMUNITY): Payer: Medicare Other

## 2012-09-21 ENCOUNTER — Encounter: Payer: Self-pay | Admitting: Internal Medicine

## 2013-01-08 ENCOUNTER — Encounter: Payer: Self-pay | Admitting: *Deleted

## 2013-01-12 ENCOUNTER — Ambulatory Visit (INDEPENDENT_AMBULATORY_CARE_PROVIDER_SITE_OTHER): Payer: Medicare Other | Admitting: Internal Medicine

## 2013-01-12 ENCOUNTER — Encounter: Payer: Self-pay | Admitting: Internal Medicine

## 2013-01-12 VITALS — BP 148/88 | HR 88 | Ht 61.0 in | Wt 163.1 lb

## 2013-01-12 DIAGNOSIS — R002 Palpitations: Secondary | ICD-10-CM

## 2013-01-12 DIAGNOSIS — I2510 Atherosclerotic heart disease of native coronary artery without angina pectoris: Secondary | ICD-10-CM

## 2013-01-12 DIAGNOSIS — I6523 Occlusion and stenosis of bilateral carotid arteries: Secondary | ICD-10-CM

## 2013-01-12 DIAGNOSIS — Z951 Presence of aortocoronary bypass graft: Secondary | ICD-10-CM

## 2013-01-12 DIAGNOSIS — E039 Hypothyroidism, unspecified: Secondary | ICD-10-CM

## 2013-01-12 DIAGNOSIS — I6529 Occlusion and stenosis of unspecified carotid artery: Secondary | ICD-10-CM

## 2013-01-12 DIAGNOSIS — E785 Hyperlipidemia, unspecified: Secondary | ICD-10-CM

## 2013-01-12 DIAGNOSIS — I10 Essential (primary) hypertension: Secondary | ICD-10-CM

## 2013-01-12 DIAGNOSIS — G459 Transient cerebral ischemic attack, unspecified: Secondary | ICD-10-CM

## 2013-01-12 DIAGNOSIS — G8929 Other chronic pain: Secondary | ICD-10-CM

## 2013-01-12 DIAGNOSIS — Z789 Other specified health status: Secondary | ICD-10-CM

## 2013-01-12 DIAGNOSIS — I658 Occlusion and stenosis of other precerebral arteries: Secondary | ICD-10-CM

## 2013-01-12 NOTE — Patient Instructions (Addendum)
Your physician has requested that you have a carotid duplex. This test is an ultrasound of the carotid arteries in your neck. It looks at blood flow through these arteries that supply the brain with blood. Allow one hour for this exam. There are no restrictions or special instructions.  Your physician wants you to follow-up in: 6 months. You will receive a reminder letter in the mail two months in advance. If you don't receive a letter, please call our office to schedule the follow-up appointment.  Take 74m Aspirin every day.

## 2013-01-13 ENCOUNTER — Encounter: Payer: Self-pay | Admitting: Internal Medicine

## 2013-01-13 DIAGNOSIS — G8929 Other chronic pain: Secondary | ICD-10-CM | POA: Insufficient documentation

## 2013-01-13 DIAGNOSIS — I6529 Occlusion and stenosis of unspecified carotid artery: Secondary | ICD-10-CM | POA: Insufficient documentation

## 2013-01-13 DIAGNOSIS — R002 Palpitations: Secondary | ICD-10-CM | POA: Insufficient documentation

## 2013-01-13 DIAGNOSIS — Z951 Presence of aortocoronary bypass graft: Secondary | ICD-10-CM | POA: Insufficient documentation

## 2013-01-13 DIAGNOSIS — Z789 Other specified health status: Secondary | ICD-10-CM | POA: Insufficient documentation

## 2013-01-13 DIAGNOSIS — G459 Transient cerebral ischemic attack, unspecified: Secondary | ICD-10-CM | POA: Insufficient documentation

## 2013-01-13 NOTE — Progress Notes (Signed)
OFFICE NOTE  Chief Complaint:  Panel over, leg swelling, indigestion, constipation, bone aching in numbness and once on her face which has come and gone  Primary Care Physician: [name], MD  HPI:  Shannon Scott is a 72 year old female with severe combined dyslipidemia with CABG in 2005 who has been intolerant to statins, Welchol, niacin, Zetia, and other cholesterol-lower medications. In the past I had sent her to Dr. Moshe Cipro at [facility], who is a lipidologist, for evaluation for apheresis. However, due to the cost of close to $1000 a month she felt that was intolerable. Recently she was complaining of neck pain, underwent carotid Dopplers that you ordered, and those demonstrated 40% to 59% right internal carotid stenosis and a 60% to 79% left internal carotid stenosis. She does have a left carotid bruit. In addition, she is describing a band-like pressure around her chest which she attributed to her blood pressure medicine and then had actually discontinued that; however, the symptoms still persist. This is a little bit worse with exertion, does relieve with rest or taking blood pressure medicine, and is concerning for angina. She's also complaining of some left-sided numbness facial tingling which could be TIA. She has not been taking aspirin and she reports developing what sounds like petechiae on that, but is also not willing to take Plavix or other antiplatelet medications. She does also report pain center called for her body and her shoulders and her knees and her legs as well.  PMHx:  Past Medical History  Diagnosis Date  . Dyslipidemia     intolerant to statins, welchol, niacin, zetia  . CAD (coronary artery disease)   . Bilateral carotid artery disease     L carotid bruit  . Hypertension   . History of nuclear stress test 04/24/2012    lexiscan; normal study    Past Surgical History  Procedure Laterality Date  . Coronary artery bypass graft  03/12/2004    LIMA to LAD, SVG  to circumflex, SVG to PDA  . Carotid doppler  02/2012    40-59% right int carotid artery stenosis; 60-79% L int carotid stenosis; L carotid bruit  . Transthoracic echocardiogram  04/2007    EF>55%; mild MR; mild-mod TR; mild pulm HTN; mild calcification of aortiv valve leaflets with mild valvular aortic stenosis    FAMHx:  Family History  Problem Relation Age of Onset  . Hypertension Mother   . Heart disease Mother   . Stroke Father   . Heart disease Father   . Kidney disease Brother   . Heart disease Brother     also HTN, hyperlipidemia  . Heart attack Brother   . Stroke Sister     x2  . Hypertension Sister   . Heart disease Sister     SOCHx:   reports that she has never smoked. She has never used smokeless tobacco. She reports that she does not drink alcohol or use illicit drugs.  ALLERGIES:  Allergies  Allergen Reactions  . Statins     Myalgias and memory problems    ROS: A comprehensive review of systems was negative except for: Constitutional: positive for fatigue Cardiovascular: positive for chest pain and fatigue Musculoskeletal: positive for back pain and myalgias Behavioral/Psych: positive for anxiety  HOME MEDS: Current Outpatient Prescriptions  Medication Sig Dispense Refill  . ibuprofen (ADVIL) 200 MG tablet Take 200 mg by mouth as needed.        Marland Kitchen levothyroxine (SYNTHROID, LEVOTHROID) 50 MCG tablet Take 50 mcg by  mouth every other day.       . loratadine (CLARITIN) 10 MG tablet Take 10 mg by mouth daily as needed for allergies.      Marland Kitchen losartan-hydrochlorothiazide (HYZAAR) 100-12.5 MG per tablet Take 1 tablet by mouth daily.      Marland Kitchen diltiazem (CARDIZEM CD) 180 MG 24 hr capsule Take 1 capsule by mouth daily.       No current facility-administered medications for this visit.    LABS/IMAGING: No results found for this or any previous visit (from the past 48 hour(s)). No results found.  VITALS: BP 148/88  Pulse 88  Ht 5' 1"  (1.549 m)  Wt 163 lb 1.6  oz (73.982 kg)  BMI 30.83 kg/m2  EXAM: General appearance: alert and no distress Neck: no adenopathy, + left carotid bruit, no JVD, supple, symmetrical, trachea midline and thyroid not enlarged, symmetric, no tenderness/mass/nodules Lungs: clear to auscultation bilaterally Heart: regular rate and rhythm, S1, S2 normal, no murmur, click, rub or gallop Abdomen: soft, non-tender; bowel sounds normal; no masses,  no organomegaly Extremities: extremities normal, atraumatic, no cyanosis or edema Pulses: 2+ and symmetric Skin: Skin color, texture, turgor normal. No rashes or lesions Neurologic: Grossly normal  EKG: Normal sinus rhythm at 88   ASSESSMENT: 1. Questionable TIA's not on aspirin 2. Multidrug intolerance - including statins, welchol, zetia, etc. 3.   CABG 4.   Known moderate to severe bilateral carotid artery disease   PLAN: 1.   [name] if there is intolerant to, or procedures being allergic to numerous medications and therefore has limited her medications for use. It sounds as she could possibly be having TIA symptoms and is not on any antiplatelet medication. I convinced her to try to take low-dose aspirin in the short-term I would like to repeat her carotid Dopplers to see if they progress to significance. She does have a bruit he does notable on the left but none on the right.  Unfortunately with her multistep intolerance and her unwillingness to undergo lipid a pheresis in the past, there is little that we can do to slow the progression of her carotid and coronary artery disease.  Pixie Casino, MD, [facility] Attending Cardiologist [facility] 01/13/2013, 1:50 PM

## 2013-01-23 ENCOUNTER — Ambulatory Visit (HOSPITAL_COMMUNITY)
Admission: RE | Admit: 2013-01-23 | Discharge: 2013-01-23 | Disposition: A | Discharge status: Home / Self Care | Payer: Medicare Other | Source: Ambulatory Visit | Attending: Cardiovascular Disease | Admitting: Cardiovascular Disease

## 2013-01-23 DIAGNOSIS — I6529 Occlusion and stenosis of unspecified carotid artery: Secondary | ICD-10-CM | POA: Insufficient documentation

## 2013-01-23 DIAGNOSIS — I6523 Occlusion and stenosis of bilateral carotid arteries: Secondary | ICD-10-CM

## 2013-01-23 NOTE — Progress Notes (Signed)
Carotid Duplex Completed. °Brianna L Mazza,RVT °

## 2013-01-25 ENCOUNTER — Encounter: Payer: Self-pay | Admitting: *Deleted

## 2013-01-25 ENCOUNTER — Telehealth: Payer: Self-pay | Admitting: *Deleted

## 2013-01-25 DIAGNOSIS — I779 Disorder of arteries and arterioles, unspecified: Secondary | ICD-10-CM

## 2013-01-25 NOTE — Telephone Encounter (Signed)
Called patient with carotid doppler results. Patient requested that results be mailed, also released to MyChart. Order placed for f/up carotid doppler for 6 months.

## 2013-01-25 NOTE — Telephone Encounter (Signed)
Message copied by Lindell Spar on Thu Jan 25, 2013  3:13 PM ------      Message from: Chrystie Nose      Created: Thu Jan 25, 2013 12:55 PM       Please notify patient that the carotid ultrasound test revealed slightly worse plaque in the left artery, but not to a point where she needs any procedures. A repeat test in 6 months is recommended.            -Dr. Rennis Golden       ------

## 2013-01-27 ENCOUNTER — Emergency Department (HOSPITAL_COMMUNITY)
Admission: EM | Admit: 2013-01-27 | Discharge: 2013-01-27 | Disposition: A | Discharge status: Home / Self Care | Payer: Medicare Other | Attending: Emergency Medicine | Admitting: Emergency Medicine

## 2013-01-27 ENCOUNTER — Emergency Department (HOSPITAL_COMMUNITY): Payer: Medicare Other

## 2013-01-27 ENCOUNTER — Encounter (HOSPITAL_COMMUNITY): Payer: Self-pay

## 2013-01-27 DIAGNOSIS — Z951 Presence of aortocoronary bypass graft: Secondary | ICD-10-CM | POA: Insufficient documentation

## 2013-01-27 DIAGNOSIS — R110 Nausea: Secondary | ICD-10-CM | POA: Insufficient documentation

## 2013-01-27 DIAGNOSIS — Z8679 Personal history of other diseases of the circulatory system: Secondary | ICD-10-CM | POA: Insufficient documentation

## 2013-01-27 DIAGNOSIS — Z79899 Other long term (current) drug therapy: Secondary | ICD-10-CM | POA: Insufficient documentation

## 2013-01-27 DIAGNOSIS — Z8639 Personal history of other endocrine, nutritional and metabolic disease: Secondary | ICD-10-CM | POA: Insufficient documentation

## 2013-01-27 DIAGNOSIS — I2510 Atherosclerotic heart disease of native coronary artery without angina pectoris: Secondary | ICD-10-CM | POA: Insufficient documentation

## 2013-01-27 DIAGNOSIS — Z862 Personal history of diseases of the blood and blood-forming organs and certain disorders involving the immune mechanism: Secondary | ICD-10-CM | POA: Insufficient documentation

## 2013-01-27 DIAGNOSIS — I10 Essential (primary) hypertension: Secondary | ICD-10-CM | POA: Insufficient documentation

## 2013-01-27 DIAGNOSIS — K5900 Constipation, unspecified: Secondary | ICD-10-CM | POA: Insufficient documentation

## 2013-01-27 DIAGNOSIS — K5792 Diverticulitis of intestine, part unspecified, without perforation or abscess without bleeding: Secondary | ICD-10-CM

## 2013-01-27 DIAGNOSIS — K5730 Diverticulosis of large intestine without perforation or abscess without bleeding: Secondary | ICD-10-CM | POA: Insufficient documentation

## 2013-01-27 LAB — URINALYSIS, ROUTINE W REFLEX MICROSCOPIC
Bilirubin Urine: NEGATIVE
Glucose, UA: NEGATIVE mg/dL
Hgb urine dipstick: NEGATIVE
Ketones, ur: NEGATIVE mg/dL
Nitrite: NEGATIVE
Protein, ur: NEGATIVE mg/dL
Specific Gravity, Urine: 1.017 (ref 1.005–1.030)
Urobilinogen, UA: 0.2 mg/dL (ref 0.0–1.0)
pH: 6 (ref 5.0–8.0)

## 2013-01-27 LAB — COMPREHENSIVE METABOLIC PANEL
ALT: 6 U/L (ref 0–35)
AST: 12 U/L (ref 0–37)
Albumin: 3.5 g/dL (ref 3.5–5.2)
Alkaline Phosphatase: 84 U/L (ref 39–117)
BUN: 7 mg/dL (ref 6–23)
CO2: 25 mEq/L (ref 19–32)
Calcium: 9.5 mg/dL (ref 8.4–10.5)
Chloride: 103 mEq/L (ref 96–112)
Creatinine, Ser: 0.71 mg/dL (ref 0.50–1.10)
GFR calc Af Amer: >90 mL/min (ref >90)
GFR calc non Af Amer: 85 mL/min — ABNORMAL LOW (ref >90)
Glucose, Bld: 103 mg/dL — ABNORMAL HIGH (ref 70–99)
Potassium: 3.4 mEq/L — ABNORMAL LOW (ref 3.5–5.1)
Sodium: 139 mEq/L (ref 135–145)
Total Bilirubin: 0.3 mg/dL (ref 0.3–1.2)
Total Protein: 7.5 g/dL (ref 6.0–8.3)

## 2013-01-27 LAB — CBC WITH DIFFERENTIAL/PLATELET
Basophils Absolute: 0 10*3/uL (ref 0.0–0.1)
Basophils Relative: 0 % (ref 0–1)
Eosinophils Absolute: 0.1 10*3/uL (ref 0.0–0.7)
Eosinophils Relative: 2 % (ref 0–5)
HCT: 35.6 % — ABNORMAL LOW (ref 36.0–46.0)
Hemoglobin: 12.6 g/dL (ref 12.0–15.0)
Lymphocytes Relative: 12 % (ref 12–46)
Lymphs Abs: 1 10*3/uL (ref 0.7–4.0)
MCH: 30.7 pg (ref 26.0–34.0)
MCHC: 35.4 g/dL (ref 30.0–36.0)
MCV: 86.6 fL (ref 78.0–100.0)
Monocytes Absolute: 0.4 10*3/uL (ref 0.1–1.0)
Monocytes Relative: 6 % (ref 3–12)
Neutro Abs: 6.5 10*3/uL (ref 1.7–7.7)
Neutrophils Relative %: 80 % — ABNORMAL HIGH (ref 43–77)
Platelets: 294 10*3/uL (ref 150–400)
RBC: 4.11 MIL/uL (ref 3.87–5.11)
RDW: 13.6 % (ref 11.5–15.5)
WBC: 8.1 10*3/uL (ref 4.0–10.5)

## 2013-01-27 LAB — URINE MICROSCOPIC-ADD ON

## 2013-01-27 LAB — LIPASE, BLOOD: Lipase: 17 U/L (ref 11–59)

## 2013-01-27 MED ORDER — KETOROLAC TROMETHAMINE 30 MG/ML IJ SOLN: 15.0000 mg | Freq: Once | INTRAMUSCULAR | Status: DC

## 2013-01-27 MED ORDER — SULFAMETHOXAZOLE-TMP DS 800-160 MG PO TABS
1.0000 | ORAL_TABLET | Freq: Once | ORAL | Status: AC
  Administered 2013-01-27: 1 via ORAL
  Filled 2013-01-27: qty 1

## 2013-01-27 MED ORDER — SODIUM CHLORIDE 0.9 % IV SOLN
1000.0000 mL | INTRAVENOUS | Status: DC
  Administered 2013-01-27: 1000 mL via INTRAVENOUS

## 2013-01-27 MED ORDER — IOHEXOL 300 MG/ML  SOLN
100.0000 mL | Freq: Once | INTRAMUSCULAR | Status: AC | PRN
  Administered 2013-01-27: 100 mL via INTRAVENOUS

## 2013-01-27 MED ORDER — METRONIDAZOLE IN NACL 5-0.79 MG/ML-% IV SOLN
500.0000 mg | Freq: Once | INTRAVENOUS | Status: AC
  Administered 2013-01-27: 500 mg via INTRAVENOUS
  Filled 2013-01-27: qty 100

## 2013-01-27 MED ORDER — SULFAMETHOXAZOLE-TMP DS 800-160 MG PO TABS: 1.0000 | ORAL_TABLET | Freq: Two times a day (BID) | ORAL | Status: AC

## 2013-01-27 MED ORDER — IOHEXOL 300 MG/ML  SOLN
25.0000 mL | INTRAMUSCULAR | Status: DC | PRN
  Administered 2013-01-27: 25 mL via ORAL

## 2013-01-27 MED ORDER — METRONIDAZOLE 500 MG PO TABS: 500.0000 mg | ORAL_TABLET | Freq: Two times a day (BID) | ORAL | Status: AC

## 2013-01-27 MED ORDER — CIPROFLOXACIN IN D5W 400 MG/200ML IV SOLN
400.0000 mg | Freq: Once | INTRAVENOUS | Status: DC
  Administered 2013-01-27: 400 mg via INTRAVENOUS
  Filled 2013-01-27: qty 200

## 2013-01-27 MED ORDER — ONDANSETRON HCL 4 MG/2ML IJ SOLN: 4.0000 mg | Freq: Once | INTRAMUSCULAR | Status: DC

## 2013-01-27 NOTE — ED Notes (Signed)
States does not need pain medication or nausea medication that is ordered. Is comfortable at this time.

## 2013-01-27 NOTE — ED Notes (Signed)
Informed CT pt. Has completed oral contrast.

## 2013-01-27 NOTE — ED Notes (Signed)
Abdominal pain began about 1 week ago. Pain  Has increased and radiates into her vaginal  Also having dysuria denies any vomiting or diarrhea. Having mild nausea denies any fevers. Last BM 2 days ago

## 2013-01-27 NOTE — ED Notes (Signed)
Cipro partially infused; patient reports itching at site and skin around IV site is red and splotchy. Site is soft and no extravasation signs noted. With Cipro stopped, redness starting to fade. Rich Fuchs, MD.

## 2013-01-27 NOTE — ED Notes (Signed)
Pt. Continues to decline nausea medication and pain medication

## 2013-01-27 NOTE — ED Provider Notes (Signed)
CSN: [id number]     Arrival date & time 01/27/13  0747 History   First MD Initiated Contact with Patient 01/27/13 [phone]     Chief Complaint  Patient presents with  . Abdominal Pain   (Consider location/radiation/quality/duration/timing/severity/associated sxs/prior Treatment) Patient is a 72 y.o. female presenting with abdominal pain. The history is provided by the patient.  Abdominal Pain Pain location:  LLQ, RLQ and suprapubic Pain quality: aching, bloating, fullness and squeezing   Pain radiates to:  Groin and anus Pain severity:  Severe Onset quality:  Gradual Duration:  1 week Timing:  Constant Progression:  Waxing and waning Chronicity:  Recurrent Context: laxative use   Context: not alcohol use, not awakening from sleep, not diet changes, not eating, not medication withdrawal, not previous surgeries, not recent illness, not recent sexual activity and not recent travel   Relieved by:  Nothing (minimally changed w BM 3d pta, and tylenol) Worsened by:  Nothing tried Associated symptoms: constipation and nausea   Associated symptoms: no diarrhea and no vomiting   Risk factors: being elderly     Past Medical History  Diagnosis Date  . Dyslipidemia     intolerant to statins, welchol, niacin, zetia  . CAD (coronary artery disease)   . Bilateral carotid artery disease     L carotid bruit  . Hypertension   . History of nuclear stress test 04/24/2012    lexiscan; normal study   Past Surgical History  Procedure Laterality Date  . Coronary artery bypass graft  03/12/2004    LIMA to LAD, SVG to circumflex, SVG to PDA  . Carotid doppler  02/2012    40-59% right int carotid artery stenosis; 60-79% L int carotid stenosis; L carotid bruit  . Transthoracic echocardiogram  04/2007    EF>55%; mild MR; mild-mod TR; mild pulm HTN; mild calcification of aortiv valve leaflets with mild valvular aortic stenosis   Family History  Problem Relation Age of Onset  . Hypertension Mother   .  Heart disease Mother   . Stroke Father   . Heart disease Father   . Kidney disease Brother   . Heart disease Brother     also HTN, hyperlipidemia  . Heart attack Brother   . Stroke Sister     x2  . Hypertension Sister   . Heart disease Sister    History  Substance Use Topics  . Smoking status: Never Smoker   . Smokeless tobacco: Never Used  . Alcohol Use: No   OB History   Grav Para Term Preterm Abortions TAB SAB Ect Mult Living                 Review of Systems  Constitutional:       Per HPI, otherwise negative  HENT:       Per HPI, otherwise negative  Respiratory:       Per HPI, otherwise negative  Cardiovascular:       Per HPI, otherwise negative  Gastrointestinal: Positive for nausea, abdominal pain and constipation. Negative for vomiting, diarrhea, blood in stool, abdominal distention, anal bleeding and rectal pain.  Endocrine:       Negative aside from HPI  Genitourinary:       Neg aside from HPI   Musculoskeletal:       Per HPI, otherwise negative  Skin: Negative.   Neurological: Negative for syncope.    Allergies  Statins  Home Medications   Current Outpatient Rx  Name  Route  Sig  Dispense  Refill  . diltiazem (CARDIZEM CD) 180 MG 24 hr capsule   Oral   Take 180 mg by mouth daily.          Marland Kitchen ibuprofen (ADVIL) 200 MG tablet   Oral   Take 200 mg by mouth every 6 (six) hours as needed for pain.          Marland Kitchen levothyroxine (SYNTHROID, LEVOTHROID) 50 MCG tablet   Oral   Take 50 mcg by mouth every other day.          . loratadine (CLARITIN) 10 MG tablet   Oral   Take 10 mg by mouth daily as needed for allergies.         Marland Kitchen losartan-hydrochlorothiazide (HYZAAR) 100-12.5 MG per tablet   Oral   Take 1 tablet by mouth daily.          There were no vitals taken for this visit. Physical Exam  Nursing note and vitals reviewed. Constitutional: She is oriented to person, place, and time. She appears well-developed and well-nourished. No  distress.  HENT:  Head: Normocephalic and atraumatic.  Eyes: Conjunctivae and EOM are normal.  Cardiovascular: Normal rate and regular rhythm.   Pulmonary/Chest: Effort normal and breath sounds normal. No stridor. No respiratory distress.  Abdominal: Soft. Normal appearance. She exhibits no distension. There is tenderness in the right lower quadrant, suprapubic area and left lower quadrant. There is guarding. There is no rigidity and no rebound.  Musculoskeletal: She exhibits no edema.  Neurological: She is alert and oriented to person, place, and time. No cranial nerve deficit.  Skin: Skin is warm and dry.  Psychiatric: She has a normal mood and affect.    ED Course  Procedures (including critical care time) Labs Review Labs Reviewed  CBC WITH DIFFERENTIAL  COMPREHENSIVE METABOLIC PANEL  LIPASE, BLOOD  URINALYSIS, ROUTINE W REFLEX MICROSCOPIC   Imaging Review No results found.   11:13 AM I reviewed CT and labs w patient and family ABX starting for diverticulitis  12:43 PM The patient has erythematous rash about the injection site following ciprofloxacin.  Benadryl provided, alternative medication regimen started.  The patient already received Flagyl. MDM  No diagnosis found. This patient presents with abdominal pain, change in bowel habits, nausea.  On exam she is awake and alert, afebrile.  However given the change in bowel habits, her pain throughout the lower abdomen, or some suspicion for GI pathology, although she has a recent diagnosis of urinary tract infection.  This evaluation demonstrates evidence of diverticulitis the CAT scan.  Patient was started on antibiotics.  These were initially well tolerated, but the patient had cutaneous reaction following provision of the ciprofloxacin.  Following a period of monitoring, switch of antibiotic regimen, she is discharged in stable condition.    Carmin Muskrat, MD 01/27/13 [phone]

## 2013-01-27 NOTE — ED Notes (Signed)
PT. Did not want her pain medication or her nausea medication

## 2013-04-05 ENCOUNTER — Other Ambulatory Visit: Payer: Self-pay

## 2013-05-07 ENCOUNTER — Encounter: Payer: Self-pay | Admitting: Internal Medicine

## 2013-06-28 ENCOUNTER — Telehealth (HOSPITAL_COMMUNITY): Payer: Self-pay | Admitting: *Deleted

## 2013-07-27 ENCOUNTER — Ambulatory Visit (HOSPITAL_COMMUNITY)
Admission: RE | Admit: 2013-07-27 | Discharge: 2013-07-27 | Disposition: A | Discharge status: Home / Self Care | Payer: Medicare Other | Source: Ambulatory Visit | Attending: Cardiovascular Disease | Admitting: Cardiovascular Disease

## 2013-07-27 DIAGNOSIS — I779 Disorder of arteries and arterioles, unspecified: Secondary | ICD-10-CM | POA: Insufficient documentation

## 2013-07-27 DIAGNOSIS — I739 Peripheral vascular disease, unspecified: Secondary | ICD-10-CM

## 2013-07-27 NOTE — Progress Notes (Signed)
Carotid Duplex Completed. °Brianna L Mazza,RVT °

## 2013-08-02 ENCOUNTER — Telehealth: Payer: Self-pay | Admitting: *Deleted

## 2013-08-02 DIAGNOSIS — I6529 Occlusion and stenosis of unspecified carotid artery: Secondary | ICD-10-CM

## 2013-08-02 NOTE — Telephone Encounter (Signed)
Patient notified of results and that Dr Debara Pickett wants to repeat study in 6 months. verbalized understanding.

## 2013-08-02 NOTE — Telephone Encounter (Signed)
Message copied by Fidel Levy on Thu Aug 02, 2013  2:05 PM ------      Message from: Pixie Casino      Created: Thu Aug 02, 2013 10:50 AM       Mild increase in blockage of the left carotid - recommend follow-up in 6 months.  Please let her know.            Dr. Debara Pickett ------

## 2014-01-29 ENCOUNTER — Telehealth (HOSPITAL_COMMUNITY): Payer: Self-pay | Admitting: *Deleted

## 2014-01-30 ENCOUNTER — Telehealth (HOSPITAL_COMMUNITY): Payer: Self-pay | Admitting: *Deleted

## 2014-02-26 ENCOUNTER — Ambulatory Visit (HOSPITAL_COMMUNITY)
Admission: RE | Admit: 2014-02-26 | Discharge: 2014-02-26 | Disposition: A | Discharge status: Home / Self Care | Payer: Medicare Other | Source: Ambulatory Visit | Attending: Cardiovascular Disease | Admitting: Cardiovascular Disease

## 2014-02-26 DIAGNOSIS — I6529 Occlusion and stenosis of unspecified carotid artery: Secondary | ICD-10-CM | POA: Insufficient documentation

## 2014-02-26 NOTE — Progress Notes (Signed)
Carotid Duplex Completed. °Brianna L Mazza,RVT °

## 2014-03-01 ENCOUNTER — Other Ambulatory Visit: Payer: Self-pay | Admitting: *Deleted

## 2014-03-01 DIAGNOSIS — I6529 Occlusion and stenosis of unspecified carotid artery: Secondary | ICD-10-CM

## 2014-07-15 ENCOUNTER — Ambulatory Visit: Payer: Self-pay | Admitting: Internal Medicine

## 2014-08-02 ENCOUNTER — Ambulatory Visit (INDEPENDENT_AMBULATORY_CARE_PROVIDER_SITE_OTHER): Payer: Medicare Other | Admitting: Internal Medicine

## 2014-08-02 ENCOUNTER — Encounter: Payer: Self-pay | Admitting: Internal Medicine

## 2014-08-02 VITALS — BP 196/80 | HR 92 | Ht 61.0 in | Wt 163.1 lb

## 2014-08-02 DIAGNOSIS — E785 Hyperlipidemia, unspecified: Secondary | ICD-10-CM | POA: Diagnosis not present

## 2014-08-02 DIAGNOSIS — G458 Other transient cerebral ischemic attacks and related syndromes: Secondary | ICD-10-CM

## 2014-08-02 DIAGNOSIS — I350 Nonrheumatic aortic (valve) stenosis: Secondary | ICD-10-CM | POA: Insufficient documentation

## 2014-08-02 DIAGNOSIS — I6529 Occlusion and stenosis of unspecified carotid artery: Secondary | ICD-10-CM | POA: Diagnosis not present

## 2014-08-02 DIAGNOSIS — R002 Palpitations: Secondary | ICD-10-CM

## 2014-08-02 DIAGNOSIS — Z951 Presence of aortocoronary bypass graft: Secondary | ICD-10-CM

## 2014-08-02 NOTE — Progress Notes (Signed)
OFFICE NOTE  Chief Complaint:  Ankle sweeling  Primary Care Physician: [name], MD  HPI:  Shannon Scott is a 74 year old female with severe combined dyslipidemia with CABG in 2005 who has been intolerant to statins, Welchol, niacin, Zetia, and other cholesterol-lower medications. In the past I had sent her to Dr. Moshe Cipro at [facility], who is a lipidologist, for evaluation for apheresis. However, due to the cost of close to $1000 a month she felt that was intolerable. Recently she was complaining of neck pain, underwent carotid Dopplers that you ordered, and those demonstrated 40% to 59% right internal carotid stenosis and a 60% to 79% left internal carotid stenosis. She does have a left carotid bruit. In addition, she is describing a band-like pressure around her chest which she attributed to her blood pressure medicine and then had actually discontinued that; however, the symptoms still persist. This is a little bit worse with exertion, does relieve with rest or taking blood pressure medicine, and is concerning for angina. She's also complaining of some left-sided numbness facial tingling which could be TIA. She has not been taking aspirin and she reports developing what sounds like petechiae on that, but is also not willing to take Plavix or other antiplatelet medications. She does also report pain center called for her body and her shoulders and her knees and her legs as well.  [name] returns today for follow-up. It's been about a year and a half since I seen her. She does have a history of multivessel coronary disease and prior bypass in 2005. Her last stress test was in 2013 which was negative for ischemia. An echo in 2008 showed probable mild aortic stenosis. She does have a murmur of aortic stenosis today which may be a little louder. Blood pressure is also poorly controlled today however she said she did not take her medicine. She says that she is taking only a half  of her herbs certain pill as it causes her more swelling if she takes the whole pill, despite the fact that contains a diuretic. She's been very intolerant to almost all medications including all of the statins and Zetia. No medicines of been tolerated and she continues to have a very high cholesterol over 300. I do think she be a good candidate for a PC SK 9 inhibitor.  PMHx:  Past Medical History  Diagnosis Date  . Dyslipidemia     intolerant to statins, welchol, niacin, zetia  . CAD (coronary artery disease)   . Bilateral carotid artery disease     L carotid bruit  . Hypertension   . History of nuclear stress test 04/24/2012    lexiscan; normal study    Past Surgical History  Procedure Laterality Date  . Coronary artery bypass graft  03/12/2004    LIMA to LAD, SVG to circumflex, SVG to PDA  . Carotid doppler  02/2012    40-59% right int carotid artery stenosis; 60-79% L int carotid stenosis; L carotid bruit  . Transthoracic echocardiogram  04/2007    EF>55%; mild MR; mild-mod TR; mild pulm HTN; mild calcification of aortiv valve leaflets with mild valvular aortic stenosis    FAMHx:  Family History  Problem Relation Age of Onset  . Hypertension Mother   . Heart disease Mother   . Stroke Father   . Heart disease Father   . Kidney disease Brother   . Heart disease Brother     also HTN, hyperlipidemia  . Heart attack Brother   .  Stroke Sister     x2  . Hypertension Sister   . Heart disease Sister     SOCHx:   reports that she has never smoked. She has never used smokeless tobacco. She reports that she does not drink alcohol or use illicit drugs.  ALLERGIES:  Allergies  Allergen Reactions  . Statins     Myalgias and memory problems  . Ciprofloxacin Itching    Splotchy redness with itching during IV infusion localized to arm.    ROS: A comprehensive review of systems was negative except for: Constitutional: positive for fatigue Cardiovascular: positive for chest  pain and fatigue Musculoskeletal: positive for back pain and myalgias Behavioral/Psych: positive for anxiety  HOME MEDS: Current Outpatient Prescriptions  Medication Sig Dispense Refill  . aspirin EC 81 MG tablet Take 81 mg by mouth daily.    Marland Kitchen ibuprofen (ADVIL) 200 MG tablet Take 200 mg by mouth every 6 (six) hours as needed for pain.     Marland Kitchen irbesartan-hydrochlorothiazide (AVALIDE) 150-12.5 MG per tablet Take 1 tablet by mouth daily.  0  . levothyroxine (SYNTHROID, LEVOTHROID) 50 MCG tablet Take 50 mcg by mouth every other day.     . loratadine (CLARITIN) 10 MG tablet Take 10 mg by mouth daily as needed for allergies.     No current facility-administered medications for this visit.    LABS/IMAGING: No results found for this or any previous visit (from the past 48 hour(s)). No results found.  VITALS: BP 196/80 mmHg  Pulse 92  Ht 5' 1"  (1.549 m)  Wt 163 lb 1.6 oz (73.982 kg)  BMI 30.83 kg/m2  EXAM: General appearance: alert and no distress Neck: no adenopathy, + left carotid bruit, no JVD, supple, symmetrical, trachea midline and thyroid not enlarged, symmetric, no tenderness/mass/nodules Lungs: clear to auscultation bilaterally Heart: regular rate and rhythm, S1, S2 normal, no murmur, click, rub or gallop Abdomen: soft, non-tender; bowel sounds normal; no masses,  no organomegaly Extremities: extremities normal, atraumatic, no cyanosis or edema Pulses: 2+ and symmetric Skin: Skin color, texture, turgor normal. No rashes or lesions Neurologic: Grossly normal  EKG: Deferred  ASSESSMENT: 1. History of TIAs 2. Multidrug intolerance - including statins, welchol, zetia, etc. 3.   CABG 4.   Known moderate to severe bilateral carotid artery disease  5.   Uncontrolled hypertension 6.   Aortic stenosis  PLAN: 1.   Ms. Cieslinski if there is intolerant to, or procedures being allergic to numerous medications and therefore has limited her medications for use. She has basically  failed all the statins including Zetia. She may be a good candidate for a PC SK 9 inhibitor, especially given her severe an elevated total cholesterol over 300 which is likely FH. Blood pressure is poorly controlled however she's been noncompliant and did not take her medications today. As his she's only taking a half of the pill of her herbs certain HCTZ. She does have at least mild if not moderate aortic stenosis based on her murmur today. Her last echo was in 2008 and she is well overdue for repeat. I recommend getting an echo today and gave her information about the 2 PC SK 9 inhibitors. She is to read about this and will consider referring her for prior authorization.  Plan to see her back in 6 months.  Pixie Casino, MD, [facility] Attending Cardiologist [facility] 08/02/2014, 4:47 PM

## 2014-08-02 NOTE — Patient Instructions (Signed)
Your physician has requested that you have an echocardiogram. Echocardiography is a painless test that uses sound waves to create images of your heart. It provides your doctor with information about the size and shape of your heart and how well your heart's chambers and valves are working. This procedure takes approximately one hour. There are no restrictions for this procedure.  PCSK-9 Inhibitors - cholesterol medications >> Repatha >> Praluent  Your physician wants you to follow-up in: 6 months with Dr. Debara Pickett. You will receive a reminder letter in the mail two months in advance. If you don't receive a letter, please call our office to schedule the follow-up appointment.

## 2014-08-12 ENCOUNTER — Ambulatory Visit (HOSPITAL_COMMUNITY)
Admission: RE | Admit: 2014-08-12 | Discharge: 2014-08-12 | Disposition: A | Discharge status: Home / Self Care | Payer: Medicare Other | Source: Ambulatory Visit | Attending: Cardiology | Admitting: Cardiology

## 2014-08-12 DIAGNOSIS — I350 Nonrheumatic aortic (valve) stenosis: Secondary | ICD-10-CM

## 2014-08-12 DIAGNOSIS — Z951 Presence of aortocoronary bypass graft: Secondary | ICD-10-CM | POA: Diagnosis not present

## 2014-08-12 NOTE — Progress Notes (Signed)
2D Echocardiogram Complete.  08/12/2014   Erman Thum, [place]

## 2014-08-19 DIAGNOSIS — E039 Hypothyroidism, unspecified: Secondary | ICD-10-CM | POA: Diagnosis not present

## 2014-08-19 DIAGNOSIS — I10 Essential (primary) hypertension: Secondary | ICD-10-CM | POA: Diagnosis not present

## 2014-08-19 DIAGNOSIS — I2510 Atherosclerotic heart disease of native coronary artery without angina pectoris: Secondary | ICD-10-CM | POA: Diagnosis not present

## 2014-08-19 DIAGNOSIS — E780 Pure hypercholesterolemia: Secondary | ICD-10-CM | POA: Diagnosis not present

## 2014-11-11 DIAGNOSIS — I2510 Atherosclerotic heart disease of native coronary artery without angina pectoris: Secondary | ICD-10-CM | POA: Diagnosis not present

## 2014-11-11 DIAGNOSIS — E538 Deficiency of other specified B group vitamins: Secondary | ICD-10-CM | POA: Diagnosis not present

## 2014-11-11 DIAGNOSIS — E780 Pure hypercholesterolemia: Secondary | ICD-10-CM | POA: Diagnosis not present

## 2014-11-11 DIAGNOSIS — I10 Essential (primary) hypertension: Secondary | ICD-10-CM | POA: Diagnosis not present

## 2014-11-11 DIAGNOSIS — E039 Hypothyroidism, unspecified: Secondary | ICD-10-CM | POA: Diagnosis not present

## 2014-11-11 DIAGNOSIS — K2940 Chronic atrophic gastritis without bleeding: Secondary | ICD-10-CM | POA: Diagnosis not present

## 2014-11-14 ENCOUNTER — Encounter: Payer: Self-pay | Admitting: Internal Medicine

## 2014-12-10 DIAGNOSIS — I2510 Atherosclerotic heart disease of native coronary artery without angina pectoris: Secondary | ICD-10-CM | POA: Diagnosis not present

## 2014-12-10 DIAGNOSIS — E039 Hypothyroidism, unspecified: Secondary | ICD-10-CM | POA: Diagnosis not present

## 2014-12-10 DIAGNOSIS — I10 Essential (primary) hypertension: Secondary | ICD-10-CM | POA: Diagnosis not present

## 2014-12-10 DIAGNOSIS — E780 Pure hypercholesterolemia: Secondary | ICD-10-CM | POA: Diagnosis not present

## 2015-03-31 DIAGNOSIS — I2510 Atherosclerotic heart disease of native coronary artery without angina pectoris: Secondary | ICD-10-CM | POA: Diagnosis not present

## 2015-03-31 DIAGNOSIS — E781 Pure hyperglyceridemia: Secondary | ICD-10-CM | POA: Diagnosis not present

## 2015-03-31 DIAGNOSIS — I10 Essential (primary) hypertension: Secondary | ICD-10-CM | POA: Diagnosis not present

## 2015-03-31 DIAGNOSIS — E039 Hypothyroidism, unspecified: Secondary | ICD-10-CM | POA: Diagnosis not present

## 2015-03-31 DIAGNOSIS — E538 Deficiency of other specified B group vitamins: Secondary | ICD-10-CM | POA: Diagnosis not present

## 2015-05-27 ENCOUNTER — Ambulatory Visit (HOSPITAL_COMMUNITY)
Admission: RE | Admit: 2015-05-27 | Discharge: 2015-05-27 | Disposition: A | Discharge status: Home / Self Care | Payer: Medicare Other | Source: Ambulatory Visit | Attending: Cardiovascular Disease | Admitting: Cardiovascular Disease

## 2015-05-27 ENCOUNTER — Other Ambulatory Visit: Payer: Self-pay | Admitting: Internal Medicine

## 2015-05-27 DIAGNOSIS — I10 Essential (primary) hypertension: Secondary | ICD-10-CM | POA: Insufficient documentation

## 2015-05-27 DIAGNOSIS — I6523 Occlusion and stenosis of bilateral carotid arteries: Secondary | ICD-10-CM | POA: Insufficient documentation

## 2015-05-27 DIAGNOSIS — I708 Atherosclerosis of other arteries: Secondary | ICD-10-CM | POA: Insufficient documentation

## 2015-05-27 DIAGNOSIS — E785 Hyperlipidemia, unspecified: Secondary | ICD-10-CM | POA: Insufficient documentation

## 2015-05-30 ENCOUNTER — Telehealth: Payer: Self-pay | Admitting: Internal Medicine

## 2015-05-30 NOTE — Telephone Encounter (Signed)
lmtcb

## 2015-05-30 NOTE — Telephone Encounter (Signed)
Returning your call from yesterday. °

## 2015-06-09 ENCOUNTER — Telehealth: Payer: Self-pay | Admitting: Internal Medicine

## 2015-06-09 NOTE — Telephone Encounter (Signed)
Pt is calling in wanting the results to her Carotid that was done on 12/27. Please f/u with her  * She also asked for a printed copy of her results*  Thanks

## 2015-06-09 NOTE — Telephone Encounter (Signed)
Called patient and advised of carotid doppler US results.   Patient asked to have a copy of the results mailed to her.  I mailed a copy to her.

## 2015-06-10 ENCOUNTER — Encounter: Payer: Self-pay | Admitting: *Deleted

## 2015-07-28 ENCOUNTER — Encounter: Payer: Self-pay | Admitting: Internal Medicine

## 2015-07-28 ENCOUNTER — Ambulatory Visit (INDEPENDENT_AMBULATORY_CARE_PROVIDER_SITE_OTHER): Payer: Medicare Other | Admitting: Internal Medicine

## 2015-07-28 VITALS — BP 184/80 | HR 81 | Ht 61.0 in | Wt 148.3 lb

## 2015-07-28 DIAGNOSIS — E559 Vitamin D deficiency, unspecified: Secondary | ICD-10-CM | POA: Diagnosis not present

## 2015-07-28 DIAGNOSIS — I350 Nonrheumatic aortic (valve) stenosis: Secondary | ICD-10-CM | POA: Diagnosis not present

## 2015-07-28 DIAGNOSIS — E7800 Pure hypercholesterolemia, unspecified: Secondary | ICD-10-CM | POA: Diagnosis not present

## 2015-07-28 DIAGNOSIS — Z789 Other specified health status: Secondary | ICD-10-CM

## 2015-07-28 DIAGNOSIS — I10 Essential (primary) hypertension: Secondary | ICD-10-CM | POA: Diagnosis not present

## 2015-07-28 DIAGNOSIS — Z951 Presence of aortocoronary bypass graft: Secondary | ICD-10-CM | POA: Diagnosis not present

## 2015-07-28 DIAGNOSIS — E7801 Familial hypercholesterolemia: Secondary | ICD-10-CM

## 2015-07-28 DIAGNOSIS — G458 Other transient cerebral ischemic attacks and related syndromes: Secondary | ICD-10-CM

## 2015-07-28 DIAGNOSIS — E039 Hypothyroidism, unspecified: Secondary | ICD-10-CM | POA: Diagnosis not present

## 2015-07-28 DIAGNOSIS — I2510 Atherosclerotic heart disease of native coronary artery without angina pectoris: Secondary | ICD-10-CM | POA: Diagnosis not present

## 2015-07-28 DIAGNOSIS — R51 Headache: Secondary | ICD-10-CM | POA: Diagnosis not present

## 2015-07-28 DIAGNOSIS — Z889 Allergy status to unspecified drugs, medicaments and biological substances status: Secondary | ICD-10-CM | POA: Diagnosis not present

## 2015-07-28 NOTE — Progress Notes (Signed)
OFFICE NOTE  Chief Complaint:  Occasional edema  Primary Care Physician: [name], MD  HPI:  Shannon Scott is a 75 year old female with severe combined dyslipidemia with CABG in 2005 who has been intolerant to statins, Welchol, niacin, Zetia, and other cholesterol-lower medications. In the past I had sent her to Dr. Moshe Cipro at [facility], who is a lipidologist, for evaluation for apheresis. However, due to the cost of close to $1000 a month she felt that was intolerable. Recently she was complaining of neck pain, underwent carotid Dopplers that you ordered, and those demonstrated 40% to 59% right internal carotid stenosis and a 60% to 79% left internal carotid stenosis. She does have a left carotid bruit. In addition, she is describing a band-like pressure around her chest which she attributed to her blood pressure medicine and then had actually discontinued that; however, the symptoms still persist. This is a little bit worse with exertion, does relieve with rest or taking blood pressure medicine, and is concerning for angina. She's also complaining of some left-sided numbness facial tingling which could be TIA. She has not been taking aspirin and she reports developing what sounds like petechiae on that, but is also not willing to take Plavix or other antiplatelet medications. She does also report pain center called for her body and her shoulders and her knees and her legs as well.  Shannon Scott returns today for follow-up. It's been about a year and a half since I seen her. She does have a history of multivessel coronary disease and prior bypass in 2005. Her last stress test was in 2013 which was negative for ischemia. An echo in 2008 showed probable mild aortic stenosis. She does have a murmur of aortic stenosis today which may be a little louder. Blood pressure is also poorly controlled today however she said she did not take her medicine. She says that she is taking only a half  of her herbs certain pill as it causes her more swelling if she takes the whole pill, despite the fact that contains a diuretic. She's been very intolerant to almost all medications including all of the statins and Zetia. No medicines of been tolerated and she continues to have a very high cholesterol over 300. I do think she be a good candidate for a PC SK 9 inhibitor.  Shannon Scott returns today for follow-up. She is recently started on BiDil and report some mild improvement in blood pressure although she feels when her blood pressure gets low that she is symptomatic. She may have some degree of lack of cerebral autoregulation due to long-standing hypertension. Unfortunate she's been intolerant to all medications. She's been tried on all of the statin medications, WelChol, niacin and steady and has been intolerant. I referred her to [facility] in 2013 for apheresis, however due to the very high cost this was never pursued. With the recent approval of PC SK 9 inhibitors, I think this would be a very good option for her. She has known ASCVD and FH based on her lipid profile. Her most recent cholesterol demonstrated total cholesterol 401, triglycerides 91, HDL 50, and LDL 333. Based on these findings her Dutch score is 8.  PMHx:  Past Medical History  Diagnosis Date  . Dyslipidemia     intolerant to statins, welchol, niacin, zetia  . CAD (coronary artery disease)   . Bilateral carotid artery disease (HCC)     L carotid bruit  . Hypertension   . History of nuclear stress  test 04/24/2012    lexiscan; normal study    Past Surgical History  Procedure Laterality Date  . Coronary artery bypass graft  03/12/2004    LIMA to LAD, SVG to circumflex, SVG to PDA  . Carotid doppler  02/2012    40-59% right int carotid artery stenosis; 60-79% L int carotid stenosis; L carotid bruit  . Transthoracic echocardiogram  04/2007    EF>55%; mild MR; mild-mod TR; mild pulm HTN; mild calcification of aortiv valve leaflets  with mild valvular aortic stenosis    FAMHx:  Family History  Problem Relation Age of Onset  . Hypertension Mother   . Heart disease Mother   . Stroke Father   . Heart disease Father   . Kidney disease Brother   . Heart disease Brother     also HTN, hyperlipidemia  . Heart attack Brother   . Stroke Sister     x2  . Hypertension Sister   . Heart disease Sister     SOCHx:   reports that she has never smoked. She has never used smokeless tobacco. She reports that she does not drink alcohol or use illicit drugs.  ALLERGIES:  Allergies  Allergen Reactions  . Statins     Myalgias and memory problems  . Ciprofloxacin Itching    Splotchy redness with itching during IV infusion localized to arm.    ROS: A comprehensive review of systems was negative except for: Constitutional: positive for fatigue Cardiovascular: positive for chest pain and fatigue Musculoskeletal: positive for back pain and myalgias Behavioral/Psych: positive for anxiety  HOME MEDS: Current Outpatient Prescriptions  Medication Sig Dispense Refill  . aspirin EC 81 MG tablet Take 81 mg by mouth daily.    Marland Kitchen ibuprofen (ADVIL) 200 MG tablet Take 200 mg by mouth every 6 (six) hours as needed for pain.     . isosorbide-hydrALAZINE (BIDIL) 20-37.5 MG tablet Take 1 tablet by mouth daily.    Marland Kitchen levothyroxine (SYNTHROID, LEVOTHROID) 50 MCG tablet Take 50 mcg by mouth every other day.     . loratadine (CLARITIN) 10 MG tablet Take 10 mg by mouth daily as needed for allergies.     No current facility-administered medications for this visit.    LABS/IMAGING: No results found for this or any previous visit (from the past 48 hour(s)). No results found.  VITALS: BP 184/80 mmHg  Pulse 81  Ht 5' 1"  (1.549 m)  Wt 148 lb 4.8 oz (67.268 kg)  BMI 28.04 kg/m2  EXAM: General appearance: alert and no distress Neck: no adenopathy, + left carotid bruit, no JVD, supple, symmetrical, trachea midline and thyroid not enlarged,  symmetric, no tenderness/mass/nodules Lungs: clear to auscultation bilaterally Heart: regular rate and rhythm, S1, S2 normal, no murmur, click, rub or gallop Abdomen: soft, non-tender; bowel sounds normal; no masses,  no organomegaly Extremities: extremities normal, atraumatic, no cyanosis or edema Pulses: 2+ and symmetric Skin: Skin color, texture, turgor normal. No rashes or lesions Neurologic: Grossly normal  EKG: Normal sinus rhythm at 81, voltage criteria for LVH, stable inferior and lateral T-wave changes  ASSESSMENT: 1. Familial hypercholesterolemia with a [place] score of 8 2. History of TIAs 3. Multidrug intolerance - including statins, welchol, zetia, etc. 3.   CABG x 3 (2005) 4.   Known moderate to severe bilateral carotid artery disease  5.   Uncontrolled hypertension 6.   Aortic stenosis  PLAN: 1.   Shannon Scott with all of the findings that are concerning related to high cholesterol.  She has moderate to severe bilateral carotid artery disease, multivessel coronary disease status post bypass, mild aortic stenosis, and has had progression and worsening of these diseases. Unfortunately she's been intolerant of all of the statins, WelChol, study, niacin and other treatments to lower cholesterol. She was referred for lipoprotein a pheresis which she declined due to the very high cost of the medications out of pocket. With the recent advent of PC SK 9 inhibitors, I feel that this is an option to potentially stabilize her reverse some of her cardiovascular disease. Given that she has familial hypercholesterolemia, the only indicated medication is Repatha - this can be delivered in once monthly dosing with a pump. I discussed these options with her and she is interested in proceeding. We'll apply for authorization from her insurance company for coverage as Praluent is not FDA approved for familial hypercholesterolemia.   Follow-up with Erasmo Downer and myself in a few months for ongoing  reassessment of her cholesterol.  Pixie Casino, MD, [facility] Attending Cardiologist [place] [facility] 07/28/2015, 5:02 PM

## 2015-07-28 NOTE — Patient Instructions (Signed)
Your physician wants you to follow-up in: 6 months with Dr. Hilty. You will receive a reminder letter in the mail two months in advance. If you don't receive a letter, please call our office to schedule the follow-up appointment.    

## 2015-08-25 DIAGNOSIS — E039 Hypothyroidism, unspecified: Secondary | ICD-10-CM | POA: Diagnosis not present

## 2015-08-25 DIAGNOSIS — I10 Essential (primary) hypertension: Secondary | ICD-10-CM | POA: Diagnosis not present

## 2015-08-25 DIAGNOSIS — E7800 Pure hypercholesterolemia, unspecified: Secondary | ICD-10-CM | POA: Diagnosis not present

## 2015-08-25 DIAGNOSIS — I2510 Atherosclerotic heart disease of native coronary artery without angina pectoris: Secondary | ICD-10-CM | POA: Diagnosis not present

## 2015-09-18 DIAGNOSIS — I2510 Atherosclerotic heart disease of native coronary artery without angina pectoris: Secondary | ICD-10-CM | POA: Diagnosis not present

## 2015-09-18 DIAGNOSIS — E7800 Pure hypercholesterolemia, unspecified: Secondary | ICD-10-CM | POA: Diagnosis not present

## 2015-09-18 DIAGNOSIS — E039 Hypothyroidism, unspecified: Secondary | ICD-10-CM | POA: Diagnosis not present

## 2015-09-18 DIAGNOSIS — N3090 Cystitis, unspecified without hematuria: Secondary | ICD-10-CM | POA: Diagnosis not present

## 2015-09-18 DIAGNOSIS — I10 Essential (primary) hypertension: Secondary | ICD-10-CM | POA: Diagnosis not present

## 2015-09-18 DIAGNOSIS — R319 Hematuria, unspecified: Secondary | ICD-10-CM | POA: Diagnosis not present

## 2015-09-30 DIAGNOSIS — R319 Hematuria, unspecified: Secondary | ICD-10-CM | POA: Diagnosis not present

## 2015-09-30 DIAGNOSIS — I10 Essential (primary) hypertension: Secondary | ICD-10-CM | POA: Diagnosis not present

## 2015-09-30 DIAGNOSIS — N939 Abnormal uterine and vaginal bleeding, unspecified: Secondary | ICD-10-CM | POA: Diagnosis not present

## 2015-09-30 DIAGNOSIS — E039 Hypothyroidism, unspecified: Secondary | ICD-10-CM | POA: Diagnosis not present

## 2015-10-20 DIAGNOSIS — R109 Unspecified abdominal pain: Secondary | ICD-10-CM | POA: Diagnosis not present

## 2015-10-21 DIAGNOSIS — K5900 Constipation, unspecified: Secondary | ICD-10-CM | POA: Diagnosis not present

## 2015-10-21 DIAGNOSIS — K5730 Diverticulosis of large intestine without perforation or abscess without bleeding: Secondary | ICD-10-CM | POA: Diagnosis not present

## 2015-10-21 DIAGNOSIS — R194 Change in bowel habit: Secondary | ICD-10-CM | POA: Diagnosis not present

## 2015-10-29 DIAGNOSIS — N950 Postmenopausal bleeding: Secondary | ICD-10-CM | POA: Diagnosis not present

## 2015-11-21 DIAGNOSIS — R938 Abnormal findings on diagnostic imaging of other specified body structures: Secondary | ICD-10-CM | POA: Diagnosis not present

## 2015-11-21 DIAGNOSIS — C801 Malignant (primary) neoplasm, unspecified: Secondary | ICD-10-CM | POA: Diagnosis not present

## 2015-11-21 DIAGNOSIS — C541 Malignant neoplasm of endometrium: Secondary | ICD-10-CM | POA: Diagnosis not present

## 2015-11-21 DIAGNOSIS — N950 Postmenopausal bleeding: Secondary | ICD-10-CM | POA: Diagnosis not present

## 2015-11-28 DIAGNOSIS — C549 Malignant neoplasm of corpus uteri, unspecified: Secondary | ICD-10-CM | POA: Diagnosis not present

## 2015-12-17 ENCOUNTER — Encounter: Payer: Self-pay | Admitting: Gynecologic Oncology

## 2015-12-17 ENCOUNTER — Ambulatory Visit: Payer: Medicare Other | Attending: Gynecologic Oncology | Admitting: Gynecologic Oncology

## 2015-12-17 VITALS — BP 182/63 | HR 62 | Temp 98.4°F | Resp 18 | Ht 61.0 in | Wt 149.5 lb

## 2015-12-17 DIAGNOSIS — Z888 Allergy status to other drugs, medicaments and biological substances status: Secondary | ICD-10-CM | POA: Insufficient documentation

## 2015-12-17 DIAGNOSIS — Z951 Presence of aortocoronary bypass graft: Secondary | ICD-10-CM | POA: Diagnosis not present

## 2015-12-17 DIAGNOSIS — Z841 Family history of disorders of kidney and ureter: Secondary | ICD-10-CM | POA: Insufficient documentation

## 2015-12-17 DIAGNOSIS — N950 Postmenopausal bleeding: Secondary | ICD-10-CM | POA: Diagnosis not present

## 2015-12-17 DIAGNOSIS — Z881 Allergy status to other antibiotic agents status: Secondary | ICD-10-CM | POA: Insufficient documentation

## 2015-12-17 DIAGNOSIS — E784 Other hyperlipidemia: Secondary | ICD-10-CM | POA: Insufficient documentation

## 2015-12-17 DIAGNOSIS — R0989 Other specified symptoms and signs involving the circulatory and respiratory systems: Secondary | ICD-10-CM | POA: Diagnosis not present

## 2015-12-17 DIAGNOSIS — Z7982 Long term (current) use of aspirin: Secondary | ICD-10-CM | POA: Insufficient documentation

## 2015-12-17 DIAGNOSIS — Z8249 Family history of ischemic heart disease and other diseases of the circulatory system: Secondary | ICD-10-CM | POA: Insufficient documentation

## 2015-12-17 DIAGNOSIS — I2510 Atherosclerotic heart disease of native coronary artery without angina pectoris: Secondary | ICD-10-CM | POA: Diagnosis not present

## 2015-12-17 DIAGNOSIS — C541 Malignant neoplasm of endometrium: Secondary | ICD-10-CM | POA: Diagnosis not present

## 2015-12-17 DIAGNOSIS — E7801 Familial hypercholesterolemia: Secondary | ICD-10-CM | POA: Insufficient documentation

## 2015-12-17 DIAGNOSIS — E785 Hyperlipidemia, unspecified: Secondary | ICD-10-CM

## 2015-12-17 DIAGNOSIS — I272 Other secondary pulmonary hypertension: Secondary | ICD-10-CM | POA: Insufficient documentation

## 2015-12-17 DIAGNOSIS — Z823 Family history of stroke: Secondary | ICD-10-CM | POA: Diagnosis not present

## 2015-12-17 DIAGNOSIS — I10 Essential (primary) hypertension: Secondary | ICD-10-CM | POA: Diagnosis not present

## 2015-12-17 DIAGNOSIS — N959 Unspecified menopausal and perimenopausal disorder: Secondary | ICD-10-CM | POA: Insufficient documentation

## 2015-12-17 NOTE — Patient Instructions (Signed)
Preparing for your Surgery  Plan for surgery on August 1 with Dr. Nancy Marus.  You will be scheduled for a robotic assisted total hysterectomy, bilateral salpingo-oophorectomy, sentinel lymph node biopsy.  We will reach out to your cardiologist about adjusting your blood pressure medications for better control before surgery.  Pre-operative Testing -You will receive a phone call from presurgical testing at [facility] to arrange for a pre-operative testing appointment before your surgery.  This appointment normally occurs one to two weeks before your scheduled surgery.   -Bring your insurance card, copy of an advanced directive if applicable, medication list  -At that visit, you will be asked to sign a consent for a possible blood transfusion in case a transfusion becomes necessary during surgery.  The need for a blood transfusion is rare but having consent is a necessary part of your care.     -You should not be taking blood thinners or aspirin at least ten days prior to surgery unless instructed by your surgeon.  Day Before Surgery at [place] will be asked to take in a light diet the day before surgery.  Avoid carbonated beverages.  You will be advised to have nothing to eat or drink after midnight the evening before.     Eat a light diet the day before surgery.  Examples including soups, broths, toast, yogurt, mashed potatoes.  Things to avoid include carbonated beverages (fizzy beverages), raw fruits and raw vegetables, or beans.    If your bowels are filled with gas, your surgeon will have difficulty visualizing your pelvic organs which increases your surgical risks.  Your role in recovery Your role is to become active as soon as directed by your doctor, while still giving yourself time to heal.  Rest when you feel tired. You will be asked to do the following in order to speed your recovery:  - Cough and breathe deeply. This helps toclear and expand your lungs  and can prevent pneumonia. You may be given a spirometer to practice deep breathing. A staff member will show you how to use the spirometer. - Do mild physical activity. Walking or moving your legs help your circulation and body functions return to normal. A staff member will help you when you try to walk and will provide you with simple exercises. Do not try to get up or walk alone the first time. - Actively manage your pain. Managing your pain lets you move in comfort. We will ask you to rate your pain on a scale of zero to 10. It is your responsibility to tell your doctor or nurse where and how much you hurt so your pain can be treated.  Special Considerations -If you are diabetic, you may be placed on insulin after surgery to have closer control over your blood sugars to promote healing and recovery.  This does not mean that you will be discharged on insulin.  If applicable, your oral antidiabetics will be resumed when you are tolerating a solid diet.  -Your final pathology results from surgery should be available by the Friday after surgery and the results will be relayed to you when available.  Blood Transfusion Information WHAT IS A BLOOD TRANSFUSION? A transfusion is the replacement of blood or some of its parts. Blood is made up of multiple cells which provide different functions.  Red blood cells carry oxygen and are used for blood loss replacement.  White blood cells fight against infection.  Platelets control bleeding.  Plasma helps clot  blood.  Other blood products are available for specialized needs, such as hemophilia or other clotting disorders. BEFORE THE TRANSFUSION  Who gives blood for transfusions?   You may be able to donate blood to be used at a later date on yourself (autologous donation).  Relatives can be asked to donate blood. This is generally not any safer than if you have received blood from a stranger. The same precautions are taken to ensure safety when a  relative's blood is donated.  Healthy volunteers who are fully evaluated to make sure their blood is safe. This is blood bank blood. Transfusion therapy is the safest it has ever been in the practice of medicine. Before blood is taken from a donor, a complete history is taken to make sure that person has no history of diseases nor engages in risky social behavior (examples are intravenous drug use or sexual activity with multiple partners). The donor's travel history is screened to minimize risk of transmitting infections, such as malaria. The donated blood is tested for signs of infectious diseases, such as HIV and hepatitis. The blood is then tested to be sure it is compatible with you in order to minimize the chance of a transfusion reaction. If you or a relative donates blood, this is often done in anticipation of surgery and is not appropriate for emergency situations. It takes many days to process the donated blood. RISKS AND COMPLICATIONS Although transfusion therapy is very safe and saves many lives, the main dangers of transfusion include:   Getting an infectious disease.  Developing a transfusion reaction. This is an allergic reaction to something in the blood you were given. Every precaution is taken to prevent this. The decision to have a blood transfusion has been considered carefully by your caregiver before blood is given. Blood is not given unless the benefits outweigh the risks.

## 2015-12-17 NOTE — Progress Notes (Signed)
Consult Note: Gyn-Onc  Consult was requested by Dr. Benjie Karvonen for the evaluation of Shannon Scott 75 y.o. female  CC:  Chief Complaint  Patient presents with  . endometrial cancer    New consultation    Assessment/Plan:  [name]  is a 75 y.o.  year old with grade "1-2" endometrioid endometrial cancer on endometrial biopsy. She also has familial hyperlipidemia and a history of CABG in 2005. Her hyperlipidemia is not well controlled due to her aversion to drug-related side effects and unwillingness to take medications for this. Additionally she has poorly controlled HTN (also suboptimally treated due to patient intolerance to side effects).   A detailed discussion was held with the patient and her family with regard to to her endometrial cancer diagnosis. We discussed the standard management options for uterine cancer which includes surgery followed possibly by adjuvant therapy depending on the results of surgery. I discussed that her advanced age increases the liklihood that she may meet high/intermediate risk factors for recurrence and the chances that we will recommend adjuvant radiation.  The options for surgical management include a hysterectomy and removal of the tubes and ovaries possibly with lymph node sampling. A minimally invasive approach including a robotic hysterectomy or laparoscopic hysterectomy have benefits including shorter hospital stay, recovery time and better wound healing.   We discussed the risks of surgery in general including infection, bleeding, damage to surrounding structures (including bowel, bladder, ureters, nerves or vessels), and the postoperative risks of PE/ DVT, and lymphedema. I extensively reviewed the additional risks of robotic hysterectomy including possible need for conversion to open laparotomy.  I discussed positioning during surgery of trendelenberg and risks of Scott facial swelling and care we take in preoperative positioning.  After  counseling and consideration of her options, she desires to proceed with robotic assisted total hysterectomy, BSO, sentinel lymph node biopsy.   I explained to the patient that she is at substantially higher risk for perioperative complications such as MI or CVA due to her peripheral vascular disease, coronary disease and carototid disease in addition to her hypertension. I described that these risks could be reduced with preoperative optimization including change/initiation of medications. I explained that this is safe from a uterine cancer standpoint.  The patient became very angry at the suggestion of waiting for optimization of her cardiovascular risks. She is extremely afraid of spread of disease. She is unwilling to delay her surgery to reduce her risk of CVA/MI etc.   In order to expedite her surgery it will be performed by my partner, Dr Nancy Marus.  She will be seen by anesthesia for preoperative clearance and discussion of postoperative pain management. We will avoid narcotics as she is very sensitive to these.  We will contact her cardiologist to ensure all meds are optimized as much as possible in the next 13 days prior to scheduled surgery.  She was given the opportunity to ask questions, which were answered to her satisfaction, and she is agreement with the above mentioned plan of care.  HPI: Shannon Scott is a 75 year old G2P2 who is seen in consultation at the request of Dr Benjie Karvonen for grade 1-2 endometrial cancer. The patient began noticing postmenopausal bleeding in June, 2017. She saw her primary care physician who determined there was no blood in the urine, and she was then referred to Dr Benjie Karvonen who, on 11/21/15 performed a TVUS which showed a uterus measuring 6x2.7x3.3cm with normal ovaries. There was a thickened endometrium of  6.40m. A pipelle endometrial biopsy was performed on 11/21/15 which showed FIGO grade 1-2 endometrial cancer.  The pap from the same date was normal.  The  patient has a history of familial hypercholesterolemia. She has a history of a 3 vessel CABG in 2005. She has never had a CVA, and recent carotid dopplers show 50% occlusion. She reports symptoms concerning for LE claudication with ambulation and stairs. She is not a smoker.  The patient does not have well controlled hypercholesterolemia or HTN due to her unwillingness to take medications that give side effects. "I would rather die from my own stuff rather than your medications". She does not take aspirin or plavix because she doesn't like the petechiae it causes on her skin.  Her surgical history is significant for a CABG in 2005, 2 cesarean sections and a tubal ligation.  Current Meds:  Outpatient Encounter Prescriptions as of 12/17/2015  Medication Sig  . aspirin EC 81 MG tablet Take 81 mg by mouth daily.  .Marland Kitchenlevothyroxine (SYNTHROID, LEVOTHROID) 50 MCG tablet Take 50 mcg by mouth daily.   .Marland Kitchenibuprofen (ADVIL) 200 MG tablet Take 200 mg by mouth every 6 (six) hours as needed for pain. Reported on 12/17/2015  . loratadine (CLARITIN) 10 MG tablet Take 10 mg by mouth daily as needed for allergies. Reported on 12/17/2015  . [DISCONTINUED] isosorbide-hydrALAZINE (BIDIL) 20-37.5 MG tablet Take 1 tablet by mouth daily.   No facility-administered encounter medications on file as of 12/17/2015.    Allergy:  Allergies  Allergen Reactions  . Statins     Myalgias and memory problems  . Ciprofloxacin Itching    Splotchy redness with itching during IV infusion localized to arm.    Social Hx:   Social History   Social History  . Marital Status: Widowed    Spouse Name: N/A  . Number of Children: 2  . Years of Education: N/A   Occupational History  .     Social History Main Topics  . Smoking status: Never Smoker   . Smokeless tobacco: Never Used  . Alcohol Use: No  . Drug Use: No  . Sexual Activity: Not on file   Other Topics Concern  . Not on file   Social History Narrative    Past  Surgical Hx:  Past Surgical History  Procedure Laterality Date  . Coronary artery bypass graft  03/12/2004    LIMA to LAD, SVG to circumflex, SVG to PDA  . Carotid doppler  02/2012    40-59% right int carotid artery stenosis; 60-79% L int carotid stenosis; L carotid bruit  . Transthoracic echocardiogram  04/2007    EF>55%; mild MR; mild-mod TR; mild pulm HTN; mild calcification of aortiv valve leaflets with mild valvular aortic stenosis    Past Medical Hx:  Past Medical History  Diagnosis Date  . Dyslipidemia     intolerant to statins, welchol, niacin, zetia  . CAD (coronary artery disease)   . Bilateral carotid artery disease (HCC)     L carotid bruit  . Hypertension   . History of nuclear stress test 04/24/2012    lexiscan; normal study    Past Gynecological History:  See HPI  No LMP recorded. Patient is postmenopausal.  Family Hx:  Family History  Problem Relation Age of Onset  . Hypertension Mother   . Heart disease Mother   . Stroke Father   . Heart disease Father   . Kidney disease Brother   . Heart disease Brother  also HTN, hyperlipidemia  . Heart attack Brother   . Stroke Sister     x2  . Hypertension Sister   . Heart disease Sister     Review of Systems:  Constitutional  Feels well,    ENT Normal appearing ears and nares bilaterally Skin/Breast  No rash, sores, jaundice, itching, dryness Cardiovascular  No chest pain, no shortness of breath, or edema  Pulmonary  No cough or wheeze.  Gastro Intestinal  No nausea, vomitting, or diarrhoea. No bright red blood per rectum, no abdominal pain, change in bowel movement, or constipation.  Genito Urinary  No frequency, urgency, dysuria, + postmenopausal bleeding Musculo Skeletal  No myalgia, arthralgia, joint swelling. + lower extremity pain with ambulation Neurologic  No weakness, numbness, change in gait,  Psychology  No depression, anxiety, insomnia.   Vitals:  Blood pressure 182/63, pulse 62,  temperature 98.4 F (36.9 C), temperature source Oral, resp. rate 18, height 5' 1"  (1.549 m), weight 149 lb 8 oz (67.813 kg), SpO2 98 %. BMI 28kg/m2  Physical Exam: WD in NAD Neck  Supple NROM, without any enlargements.  Lymph Node Survey No cervical supraclavicular or inguinal adenopathy Cardiovascular  Pulse normal rate, regularity and rhythm. S1 and S2 normal.  Lungs  Clear to auscultation bilateraly, without wheezes/crackles/rhonchi. Good air movement.  Skin  No rash/lesions/breakdown  Psychiatry  Alert and oriented to person, place, and time  Abdomen  Normoactive bowel sounds, abdomen soft, non-tender and non-obese, mildly overweight without evidence of hernia.  Back No CVA tenderness Genito Urinary  Vulva/vagina: Normal external female genitalia.   No lesions. No discharge or bleeding.  Bladder/urethra:  No lesions or masses, well supported bladder  Vagina: normal  Cervix: Normal appearing, no lesions.  Uterus: Small, mobile, no parametrial involvement or nodularity.  Adnexa: no palpable masses. Rectal  deferred.  Extremities  No bilateral cyanosis, clubbing or edema.   Donaciano Eva, MD  12/17/2015, 11:23 AM

## 2015-12-18 ENCOUNTER — Telehealth: Payer: Self-pay | Admitting: *Deleted

## 2015-12-18 ENCOUNTER — Telehealth: Payer: Self-pay

## 2015-12-18 NOTE — Telephone Encounter (Signed)
"  Someone called me from there and I missed the call.  I do not think a message was left."  Call transferred to GYN ext [phone].

## 2015-12-18 NOTE — Telephone Encounter (Signed)
Orders received from [facility] to contact the patient to clarify if she is taking Aspirin and metoprolol . Attempted to contact the patient on her land line and cell number , unable to left a message on the patient's cell ,no VM. However, did leave a detailed message on her land line (home) with call back requested. Writer provided call back information to return call , updated Melissa Cross, APNP of being unable to reach the patient.

## 2015-12-19 NOTE — Telephone Encounter (Signed)
Follow up call placed third attempt to contact the patient to clarify if she was taking Aspirin 81 mg po daily and Metoprolol. Patient states she is taking both of these medications , states the dose for Metoprolol is 50 mg po daily . Patient states Dr Loletta Specter prescribed it for her , patient denies further questions at this time, Melissa Cross APNP updated.

## 2015-12-23 ENCOUNTER — Telehealth: Payer: Self-pay

## 2015-12-23 NOTE — Telephone Encounter (Signed)
Orders received from [facility] to contact Dr Jeanann Lewandowsky [facility]: [phone] to have the patient's B/P assessed , during last visit it was 182/63 . Dr Ainsley Spinner office was contacted , spoke with manager and two appointments were scheduled for Friday Julty 28,2017 at 8:45 AM and  Monday July 31 , 2017 at 9 AM . Patient contacted and updated with the appointments . Patient states understanding and states she will complete the appointments as recommended . Patient also understandings that if she does not complete these appointments with Dr Jeanann Lewandowsky , she will not be able to have the scheduled surgery for December 30, 2015 safely. Patient states understanding ,denies further questions at this time.

## 2015-12-24 DIAGNOSIS — E039 Hypothyroidism, unspecified: Secondary | ICD-10-CM | POA: Diagnosis not present

## 2015-12-24 DIAGNOSIS — E7800 Pure hypercholesterolemia, unspecified: Secondary | ICD-10-CM | POA: Diagnosis not present

## 2015-12-24 DIAGNOSIS — I2510 Atherosclerotic heart disease of native coronary artery without angina pectoris: Secondary | ICD-10-CM | POA: Diagnosis not present

## 2015-12-24 DIAGNOSIS — I10 Essential (primary) hypertension: Secondary | ICD-10-CM | POA: Diagnosis not present

## 2015-12-26 ENCOUNTER — Encounter (HOSPITAL_COMMUNITY): Payer: Self-pay

## 2015-12-26 ENCOUNTER — Ambulatory Visit (HOSPITAL_COMMUNITY)
Admission: RE | Admit: 2015-12-26 | Discharge: 2015-12-26 | Disposition: A | Discharge status: Home / Self Care | Payer: Medicare Other | Source: Ambulatory Visit | Attending: Gynecologic Oncology | Admitting: Gynecologic Oncology

## 2015-12-26 ENCOUNTER — Encounter (HOSPITAL_COMMUNITY)
Admission: RE | Admit: 2015-12-26 | Discharge: 2015-12-26 | Disposition: A | Discharge status: Home / Self Care | Payer: Medicare Other | Source: Ambulatory Visit | Attending: Obstetrics & Gynecology | Admitting: Obstetrics & Gynecology

## 2015-12-26 DIAGNOSIS — Z01818 Encounter for other preprocedural examination: Secondary | ICD-10-CM | POA: Diagnosis not present

## 2015-12-26 DIAGNOSIS — C541 Malignant neoplasm of endometrium: Secondary | ICD-10-CM

## 2015-12-26 HISTORY — DX: Personal history of other medical treatment: Z92.89

## 2015-12-26 HISTORY — DX: Unspecified asthma, uncomplicated: J45.909

## 2015-12-26 HISTORY — DX: Malignant (primary) neoplasm, unspecified: C80.1

## 2015-12-26 HISTORY — DX: Hypothyroidism, unspecified: E03.9

## 2015-12-26 HISTORY — DX: Unspecified osteoarthritis, unspecified site: M19.90

## 2015-12-26 LAB — CBC WITH DIFFERENTIAL/PLATELET
Basophils Absolute: 0 10*3/uL (ref 0.0–0.1)
Basophils Relative: 1 %
Eosinophils Absolute: 0.1 10*3/uL (ref 0.0–0.7)
Eosinophils Relative: 2 %
HCT: 38.4 % (ref 36.0–46.0)
Hemoglobin: 12.6 g/dL (ref 12.0–15.0)
Lymphocytes Relative: 23 %
Lymphs Abs: 1.4 10*3/uL (ref 0.7–4.0)
MCH: 27.8 pg (ref 26.0–34.0)
MCHC: 32.8 g/dL (ref 30.0–36.0)
MCV: 84.8 fL (ref 78.0–100.0)
Monocytes Absolute: 0.4 10*3/uL (ref 0.1–1.0)
Monocytes Relative: 7 %
Neutro Abs: 4.3 10*3/uL (ref 1.7–7.7)
Neutrophils Relative %: 67 %
Platelets: 273 10*3/uL (ref 150–400)
RBC: 4.53 MIL/uL (ref 3.87–5.11)
RDW: 13.9 % (ref 11.5–15.5)
WBC: 6.4 10*3/uL (ref 4.0–10.5)

## 2015-12-26 LAB — COMPREHENSIVE METABOLIC PANEL
ALT: 11 U/L — ABNORMAL LOW (ref 14–54)
AST: 15 U/L (ref 15–41)
Albumin: 4.4 g/dL (ref 3.5–5.0)
Alkaline Phosphatase: 100 U/L (ref 38–126)
Anion gap: 9 (ref 5–15)
BUN: 16 mg/dL (ref 6–20)
CO2: 28 mmol/L (ref 22–32)
Calcium: 9.9 mg/dL (ref 8.9–10.3)
Chloride: 102 mmol/L (ref 101–111)
Creatinine, Ser: 0.79 mg/dL (ref 0.44–1.00)
GFR calc Af Amer: >60 mL/min (ref >60)
GFR calc non Af Amer: >60 mL/min (ref >60)
Glucose, Bld: 95 mg/dL (ref 65–99)
Potassium: 3.5 mmol/L (ref 3.5–5.1)
Sodium: 139 mmol/L (ref 135–145)
Total Bilirubin: 0.7 mg/dL (ref 0.3–1.2)
Total Protein: 8 g/dL (ref 6.5–8.1)

## 2015-12-26 LAB — URINE MICROSCOPIC-ADD ON
Bacteria, UA: NONE SEEN
WBC, UA: NONE SEEN WBC/hpf (ref 0–5)

## 2015-12-26 LAB — URINALYSIS, ROUTINE W REFLEX MICROSCOPIC
Bilirubin Urine: NEGATIVE
Glucose, UA: NEGATIVE mg/dL
Ketones, ur: NEGATIVE mg/dL
Leukocytes, UA: NEGATIVE
Nitrite: NEGATIVE
Protein, ur: NEGATIVE mg/dL
Specific Gravity, Urine: 1.008 (ref 1.005–1.030)
pH: 7 (ref 5.0–8.0)

## 2015-12-26 LAB — ABO/RH: ABO/RH(D): O POS

## 2015-12-26 NOTE — Progress Notes (Signed)
ekg from 2/17, 2014,2013, lov 2/17 dr hilty, eccho 3/16, dr Tressia Miners note, and bp's reviewed by Dr Delsa Sale. I also spoke with dr Roxanne Mins office inquiring if another EKG has been done sine 2/16 and did she have  clearance for surgery?  Was instructed to tell Ms Aimone she must come by dr Nonah Mattes office for bp visit on Monday before she gets clearance.  I informed dr Delsa Sale of this.  I spoke with patient and granddaughter instructing them to go to have bp checked ay dr Anell Barr office on Monday and that it must be done. Both verbalized understanding

## 2015-12-26 NOTE — Patient Instructions (Addendum)
Shannon Scott  12/26/2015   Your procedure is scheduled on: 12/30/15  Report to [facility] Main  Entrance take [facility]  elevators to 3rd floor to  [place] am Call this number if you have problems the morning of surgery [phone]   Remember: ONLY 1 PERSON MAY GO WITH YOU TO SHORT STAY TO GET  READY MORNING OF [place]. BEGIN LIGHT DIET ON Monday as directed by office   Do not eat food AFTER MIDNIGHT-----MAY HAVE CLEAR LIQUIDS MORNING OF SURGERY UNTIL 0715 AM--THEN NOTHING BY MOUTH        Take these medicines the morning of surgery with A SIP OF WATER: Levothyroxine DO NOT TAKE ANY DIABETIC MEDICATIONS DAY OF YOUR SURGERY                               You may not have any metal on your body including hair pins and              piercings  Do not wear jewelry, make-up, lotions, powders or perfumes, deodorant             Do not wear nail polish.  Do not shave  48 hours prior to surgery.              Men may shave face and neck.   Do not bring valuables to the hospital. [place].  Contacts, dentures or bridgework may not be worn into surgery.  Leave suitcase in the car. After surgery it may be brought to your room.   ________________________             [facility] - Preparing for Surgery Before surgery, you can play an important role.  Because skin is not sterile, your skin needs to be as free of germs as possible.  You can reduce the number of germs on your skin by washing with CHG (chlorahexidine gluconate) soap before surgery.  CHG is an antiseptic cleaner which kills germs and bonds with the skin to continue killing germs even after washing. Please DO NOT use if you have an allergy to CHG or antibacterial soaps.  If your skin becomes reddened/irritated stop using the CHG and inform your nurse when you arrive at Short Stay. Do not shave (including legs and underarms) for at least 48  hours prior to the first CHG shower.  You may shave your face/neck. Please follow these instructions carefully:  1.  Shower with CHG Soap the night before surgery and the  morning of Surgery.  2.  If you choose to wash your hair, wash your hair first as usual with your  normal  shampoo.  3.  After you shampoo, rinse your hair and body thoroughly to remove the  shampoo.                           4.  Use CHG as you would any other liquid soap.  You can apply chg directly  to the skin and wash                       Gently with a scrungie or clean washcloth.  5.  Apply the CHG Soap to your body ONLY FROM THE NECK DOWN.   Do not use on face/ open                           Wound or open sores. Avoid contact with eyes, ears mouth and genitals (private parts).                       Wash face,  Genitals (private parts) with your normal soap.             6.  Wash thoroughly, paying special attention to the area where your surgery  will be performed.  7.  Thoroughly rinse your body with warm water from the neck down.  8.  DO NOT shower/wash with your normal soap after using and rinsing off  the CHG Soap.                9.  Pat yourself dry with a clean towel.            10.  Wear clean pajamas.            11.  Place clean sheets on your bed the night of your first shower and do not  sleep with pets. Day of Surgery : Do not apply any lotions/deodorants the morning of surgery.  Please wear clean clothes to the hospital/surgery center.  FAILURE TO FOLLOW THESE INSTRUCTIONS MAY RESULT IN THE CANCELLATION OF YOUR SURGERY PATIENT SIGNATURE_________________________________  NURSE SIGNATURE__________________________________  ________________________________________________________________________   Adam Phenix  An incentive spirometer is a tool that can help keep your lungs clear and active. This tool measures how well you are filling your lungs with each breath. Taking long deep breaths may help  reverse or decrease the chance of developing breathing (pulmonary) problems (especially infection) following:  A long period of time when you are unable to move or be active. BEFORE THE PROCEDURE   If the spirometer includes an indicator to show your best effort, your nurse or respiratory therapist will set it to a desired goal.  If possible, sit up straight or lean slightly forward. Try not to slouch.  Hold the incentive spirometer in an upright position. INSTRUCTIONS FOR USE  1. Sit on the edge of your bed if possible, or sit up as far as you can in bed or on a chair. 2. Hold the incentive spirometer in an upright position. 3. Breathe out normally. 4. Place the mouthpiece in your mouth and seal your lips tightly around it. 5. Breathe in slowly and as deeply as possible, raising the piston or the ball toward the top of the column. 6. Hold your breath for 3-5 seconds or for as long as possible. Allow the piston or ball to fall to the bottom of the column. 7. Remove the mouthpiece from your mouth and breathe out normally. 8. Rest for a few seconds and repeat Steps 1 through 7 at least 10 times every 1-2 hours when you are awake. Take your time and take a few normal breaths between deep breaths. 9. The spirometer may include an indicator to show your best effort. Use the indicator as a goal to work toward during each repetition. 10. After each set of 10 deep breaths, practice coughing to be sure your lungs are clear. If you have an incision (the cut made at the time of surgery), support your incision when coughing by placing a pillow or  rolled up towels firmly against it. Once you are able to get out of bed, walk around indoors and cough well. You may stop using the incentive spirometer when instructed by your caregiver.  RISKS AND COMPLICATIONS  Take your time so you do not get dizzy or light-headed.  If you are in pain, you may need to take or ask for pain medication before doing incentive  spirometry. It is harder to take a deep breath if you are having pain. AFTER USE  Rest and breathe slowly and easily.  It can be helpful to keep track of a log of your progress. Your caregiver can provide you with a simple table to help with this. If you are using the spirometer at home, follow these instructions: [place] IF:   You are having difficultly using the spirometer.  You have trouble using the spirometer as often as instructed.  Your pain medication is not giving enough relief while using the spirometer.  You develop fever of 100.5 F (38.1 C) or higher. SEEK IMMEDIATE MEDICAL CARE IF:   You cough up bloody sputum that had not been present before.  You develop fever of 102 F (38.9 C) or greater.  You develop worsening pain at or near the incision site. MAKE SURE YOU:   Understand these instructions.  Will watch your condition.  Will get help right away if you are not doing well or get worse. Document Released: 09/27/2006 Document Revised: 08/09/2011 Document Reviewed: 11/28/2006 ExitCare Patient Information 2014 ExitCare, [place].   ________________________________________________________________________  WHAT IS A BLOOD TRANSFUSION? Blood Transfusion Information  A transfusion is the replacement of blood or some of its parts. Blood is made up of multiple cells which provide different functions.  Red blood cells carry oxygen and are used for blood loss replacement.  White blood cells fight against infection.  Platelets control bleeding.  Plasma helps clot blood.  Other blood products are available for specialized needs, such as hemophilia or other clotting disorders. BEFORE THE TRANSFUSION  Who gives blood for transfusions?   Healthy volunteers who are fully evaluated to make sure their blood is safe. This is blood bank blood. Transfusion therapy is the safest it has ever been in the practice of medicine. Before blood is taken from a donor, a  complete history is taken to make sure that person has no history of diseases nor engages in risky social behavior (examples are intravenous drug use or sexual activity with multiple partners). The donor's travel history is screened to minimize risk of transmitting infections, such as malaria. The donated blood is tested for signs of infectious diseases, such as HIV and hepatitis. The blood is then tested to be sure it is compatible with you in order to minimize the chance of a transfusion reaction. If you or a relative donates blood, this is often done in anticipation of surgery and is not appropriate for emergency situations. It takes many days to process the donated blood. RISKS AND COMPLICATIONS Although transfusion therapy is very safe and saves many lives, the main dangers of transfusion include:   Getting an infectious disease.  Developing a transfusion reaction. This is an allergic reaction to something in the blood you were given. Every precaution is taken to prevent this. The decision to have a blood transfusion has been considered carefully by your caregiver before blood is given. Blood is not given unless the benefits outweigh the risks. AFTER THE TRANSFUSION  Right after receiving a blood transfusion, you will usually  feel much better and more energetic. This is especially true if your red blood cells have gotten low (anemic). The transfusion raises the level of the red blood cells which carry oxygen, and this usually causes an energy increase.  The nurse administering the transfusion will monitor you carefully for complications. HOME CARE INSTRUCTIONS  No special instructions are needed after a transfusion. You may find your energy is better. Speak with your caregiver about any limitations on activity for underlying diseases you may have. SEEK MEDICAL CARE IF:   Your condition is not improving after your transfusion.  You develop redness or irritation at the intravenous (IV)  site. SEEK IMMEDIATE MEDICAL CARE IF:  Any of the following symptoms occur over the next 12 hours:  Shaking chills.  You have a temperature by mouth above 102 F (38.9 C), not controlled by medicine.  Chest, back, or muscle pain.  People around you feel you are not acting correctly or are confused.  Shortness of breath or difficulty breathing.  Dizziness and fainting.  You get a rash or develop hives.  You have a decrease in urine output.  Your urine turns a dark color or changes to pink, red, or brown. Any of the following symptoms occur over the next 10 days:  You have a temperature by mouth above 102 F (38.9 C), not controlled by medicine.  Shortness of breath.  Weakness after normal activity.  The white part of the eye turns yellow (jaundice).  You have a decrease in the amount of urine or are urinating less often.  Your urine turns a dark color or changes to pink, red, or brown. Document Released: 05/14/2000 Document Revised: 08/09/2011 Document Reviewed: 01/01/2008 ExitCare Patient Information 2014 ExitCare, [place].  _______________________________________________________________________Eat a light diet the day before surgery.  Examples including soups, broths, toast, yogurt, mashed potatoes.  Things to avoid include carbonated beverages (fizzy beverages), raw fruits and raw vegetables, or beans.   If your bowels are filled with gas, your surgeon will have difficulty visualizing your pelvic organs which increases your surgical risks.   CLEAR LIQUID DIET   Foods Allowed                                                                     Foods Excluded  Coffee and tea, regular and decaf                             liquids that you cannot  Plain Jell-O in any flavor                                             see through such as: Fruit ices (not with fruit pulp)                                     milk, soups, orange juice  Iced Popsicles  All solid food Carbonated beverages, regular and diet                                    Cranberry, grape and apple juices Sports drinks like Gatorade Lightly seasoned clear broth or consume(fat free) Sugar, honey syrup  Sample Menu Breakfast                                Lunch                                     Supper Cranberry juice                    Beef broth                            Chicken broth Jell-O                                     Grape juice                           Apple juice Coffee or tea                        Jell-O                                      Popsicle                                                Coffee or tea                        Coffee or tea  _____________________________________________________________________

## 2015-12-29 DIAGNOSIS — E039 Hypothyroidism, unspecified: Secondary | ICD-10-CM | POA: Diagnosis not present

## 2015-12-29 DIAGNOSIS — E7800 Pure hypercholesterolemia, unspecified: Secondary | ICD-10-CM | POA: Diagnosis not present

## 2015-12-29 DIAGNOSIS — I10 Essential (primary) hypertension: Secondary | ICD-10-CM | POA: Diagnosis not present

## 2015-12-29 DIAGNOSIS — I2510 Atherosclerotic heart disease of native coronary artery without angina pectoris: Secondary | ICD-10-CM | POA: Diagnosis not present

## 2015-12-29 NOTE — Telephone Encounter (Signed)
Incoming call received from Dr Jeanann Lewandowsky giving verbal "medical clearance" for surgery . Writer states understanding , also requesting a written statement of medical clearance be faxed to our office . Dr Carlis Abbott agreed and our fax number was give , [name], APNP updated with verbal medical clearance received from Dr Jeanann Lewandowsky.

## 2015-12-29 NOTE — Telephone Encounter (Signed)
Follow up call placed to Dr [name] office per Dr Everitt Amber requesting a copy of today's follow up visit a long with medical clearance for surgery with Dr Nancy Marus . Spoke with Dr Ainsley Spinner receptionist and she will up date Dr Jeanann Lewandowsky with our request , [name], APNP updated.

## 2015-12-30 ENCOUNTER — Ambulatory Visit (HOSPITAL_COMMUNITY): Payer: Medicare Other | Admitting: Anesthesiology

## 2015-12-30 ENCOUNTER — Encounter (HOSPITAL_COMMUNITY): Payer: Self-pay

## 2015-12-30 ENCOUNTER — Ambulatory Visit (HOSPITAL_COMMUNITY)
Admission: RE | Admit: 2015-12-30 | Discharge: 2015-12-31 | Disposition: A | Discharge status: Home / Self Care | Payer: Medicare Other | Source: Ambulatory Visit | Attending: Obstetrics & Gynecology | Admitting: Obstetrics & Gynecology

## 2015-12-30 ENCOUNTER — Encounter (HOSPITAL_COMMUNITY)
Admission: RE | Disposition: A | Discharge status: Home / Self Care | Payer: Self-pay | Source: Ambulatory Visit | Attending: Obstetrics & Gynecology

## 2015-12-30 DIAGNOSIS — E784 Other hyperlipidemia: Secondary | ICD-10-CM | POA: Insufficient documentation

## 2015-12-30 DIAGNOSIS — Z951 Presence of aortocoronary bypass graft: Secondary | ICD-10-CM | POA: Diagnosis not present

## 2015-12-30 DIAGNOSIS — Z7982 Long term (current) use of aspirin: Secondary | ICD-10-CM | POA: Insufficient documentation

## 2015-12-30 DIAGNOSIS — C542 Malignant neoplasm of myometrium: Secondary | ICD-10-CM | POA: Diagnosis not present

## 2015-12-30 DIAGNOSIS — C541 Malignant neoplasm of endometrium: Secondary | ICD-10-CM | POA: Diagnosis not present

## 2015-12-30 DIAGNOSIS — I10 Essential (primary) hypertension: Secondary | ICD-10-CM | POA: Insufficient documentation

## 2015-12-30 DIAGNOSIS — I2510 Atherosclerotic heart disease of native coronary artery without angina pectoris: Secondary | ICD-10-CM | POA: Diagnosis not present

## 2015-12-30 HISTORY — PX: ROBOTIC ASSISTED TOTAL HYSTERECTOMY WITH BILATERAL SALPINGO OOPHERECTOMY: SHX6086

## 2015-12-30 LAB — TYPE AND SCREEN
ABO/RH(D): O POS
Antibody Screen: NEGATIVE

## 2015-12-30 SURGERY — HYSTERECTOMY, TOTAL, ROBOT-ASSISTED, LAPAROSCOPIC, WITH BILATERAL SALPINGO-OOPHORECTOMY
Anesthesia: General | Laterality: Bilateral

## 2015-12-30 MED ORDER — SUGAMMADEX SODIUM 200 MG/2ML IV SOLN
INTRAVENOUS | Status: DC | PRN
  Administered 2015-12-30: 200 mg via INTRAVENOUS

## 2015-12-30 MED ORDER — LIDOCAINE HCL (CARDIAC) 20 MG/ML IV SOLN
INTRAVENOUS | Status: AC
  Filled 2015-12-30: qty 5

## 2015-12-30 MED ORDER — TRAMADOL HCL 50 MG PO TABS
100.0000 mg | ORAL_TABLET | Freq: Four times a day (QID) | ORAL | Status: DC | PRN
  Administered 2015-12-30 – 2015-12-31 (×3): 100 mg via ORAL
  Filled 2015-12-30 (×3): qty 2

## 2015-12-30 MED ORDER — FENTANYL CITRATE (PF) 250 MCG/5ML IJ SOLN
INTRAMUSCULAR | Status: AC
  Filled 2015-12-30: qty 5

## 2015-12-30 MED ORDER — CEFAZOLIN SODIUM-DEXTROSE 2-4 GM/100ML-% IV SOLN
INTRAVENOUS | Status: AC
  Filled 2015-12-30: qty 100

## 2015-12-30 MED ORDER — KCL IN DEXTROSE-NACL 20-5-0.45 MEQ/L-%-% IV SOLN
INTRAVENOUS | Status: DC
  Administered 2015-12-30: 21:00:00 via INTRAVENOUS
  Filled 2015-12-30: qty 1000

## 2015-12-30 MED ORDER — HEMOSTATIC AGENTS (NO CHARGE) OPTIME
TOPICAL | Status: DC | PRN
  Administered 2015-12-30: 1 via TOPICAL

## 2015-12-30 MED ORDER — MIDAZOLAM HCL 5 MG/5ML IJ SOLN
INTRAMUSCULAR | Status: DC | PRN
  Administered 2015-12-30 (×2): 1 mg via INTRAVENOUS

## 2015-12-30 MED ORDER — MEPERIDINE HCL 50 MG/ML IJ SOLN
6.2500 mg | INTRAMUSCULAR | Status: DC | PRN
  Administered 2015-12-30: 6.25 mg via INTRAVENOUS

## 2015-12-30 MED ORDER — HYDRALAZINE HCL 20 MG/ML IJ SOLN
INTRAMUSCULAR | Status: DC | PRN
  Administered 2015-12-30: 5 mg via INTRAVENOUS

## 2015-12-30 MED ORDER — DEXAMETHASONE SODIUM PHOSPHATE 10 MG/ML IJ SOLN
INTRAMUSCULAR | Status: AC
  Filled 2015-12-30: qty 1

## 2015-12-30 MED ORDER — ONDANSETRON HCL 4 MG/2ML IJ SOLN
INTRAMUSCULAR | Status: AC
  Filled 2015-12-30: qty 2

## 2015-12-30 MED ORDER — IRBESARTAN 150 MG PO TABS: 300.0000 mg | ORAL_TABLET | Freq: Every day | ORAL | Status: DC

## 2015-12-30 MED ORDER — MIDAZOLAM HCL 2 MG/2ML IJ SOLN
INTRAMUSCULAR | Status: AC
  Filled 2015-12-30: qty 2

## 2015-12-30 MED ORDER — FENTANYL CITRATE (PF) 100 MCG/2ML IJ SOLN
25.0000 ug | INTRAMUSCULAR | Status: DC | PRN
  Administered 2015-12-30: 25 ug via INTRAVENOUS

## 2015-12-30 MED ORDER — ONDANSETRON HCL 4 MG PO TABS: 4.0000 mg | ORAL_TABLET | Freq: Four times a day (QID) | ORAL | Status: DC | PRN

## 2015-12-30 MED ORDER — CHLORTHALIDONE 25 MG PO TABS
25.0000 mg | ORAL_TABLET | Freq: Every day | ORAL | Status: DC
  Administered 2015-12-30: 25 mg via ORAL
  Filled 2015-12-30: qty 1

## 2015-12-30 MED ORDER — GABAPENTIN 600 MG PO TABS
300.0000 mg | ORAL_TABLET | Freq: Every day | ORAL | Status: DC
Start: 2015-12-30 — End: 2015-12-30
  Filled 2015-12-30: qty 0.5

## 2015-12-30 MED ORDER — ONDANSETRON HCL 4 MG/2ML IJ SOLN
INTRAMUSCULAR | Status: DC | PRN
  Administered 2015-12-30: 4 mg via INTRAVENOUS

## 2015-12-30 MED ORDER — CEFAZOLIN SODIUM-DEXTROSE 2-4 GM/100ML-% IV SOLN
2.0000 g | INTRAVENOUS | Status: AC
  Administered 2015-12-30: 2 g via INTRAVENOUS

## 2015-12-30 MED ORDER — LIDOCAINE HCL (CARDIAC) 20 MG/ML IV SOLN
INTRAVENOUS | Status: DC | PRN
  Administered 2015-12-30: 50 mg via INTRAVENOUS

## 2015-12-30 MED ORDER — GABAPENTIN 300 MG PO CAPS
300.0000 mg | ORAL_CAPSULE | Freq: Every day | ORAL | Status: DC
  Administered 2015-12-30: 300 mg via ORAL
  Filled 2015-12-30: qty 1

## 2015-12-30 MED ORDER — ONDANSETRON HCL 4 MG/2ML IJ SOLN
4.0000 mg | Freq: Four times a day (QID) | INTRAMUSCULAR | Status: DC | PRN
Start: 2015-12-30 — End: 2015-12-31

## 2015-12-30 MED ORDER — IRBESARTAN 150 MG PO TABS
300.0000 mg | ORAL_TABLET | Freq: Every day | ORAL | Status: DC
  Administered 2015-12-30: 300 mg via ORAL
  Filled 2015-12-30: qty 2

## 2015-12-30 MED ORDER — ENOXAPARIN SODIUM 40 MG/0.4ML ~~LOC~~ SOLN
40.0000 mg | SUBCUTANEOUS | Status: AC
  Administered 2015-12-30: 40 mg via SUBCUTANEOUS
  Filled 2015-12-30: qty 0.4

## 2015-12-30 MED ORDER — MEPERIDINE HCL 50 MG/ML IJ SOLN
INTRAMUSCULAR | Status: AC
  Filled 2015-12-30: qty 1

## 2015-12-30 MED ORDER — METOPROLOL SUCCINATE ER 50 MG PO TB24
50.0000 mg | ORAL_TABLET | Freq: Every day | ORAL | Status: DC
  Administered 2015-12-30: 50 mg via ORAL
  Filled 2015-12-30: qty 1

## 2015-12-30 MED ORDER — ROCURONIUM BROMIDE 100 MG/10ML IV SOLN
INTRAVENOUS | Status: DC | PRN
  Administered 2015-12-30: 10 mg via INTRAVENOUS
  Administered 2015-12-30: 50 mg via INTRAVENOUS
  Administered 2015-12-30: 10 mg via INTRAVENOUS

## 2015-12-30 MED ORDER — AZILSARTAN-CHLORTHALIDONE 40-25 MG PO TABS: 1.0000 | ORAL_TABLET | Freq: Every day | ORAL | Status: DC

## 2015-12-30 MED ORDER — IBUPROFEN 800 MG PO TABS
800.0000 mg | ORAL_TABLET | Freq: Three times a day (TID) | ORAL | Status: DC | PRN
  Filled 2015-12-30: qty 1

## 2015-12-30 MED ORDER — HYDRALAZINE HCL 20 MG/ML IJ SOLN
INTRAMUSCULAR | Status: AC
  Filled 2015-12-30: qty 1

## 2015-12-30 MED ORDER — ASPIRIN EC 81 MG PO TBEC
81.0000 mg | DELAYED_RELEASE_TABLET | Freq: Every day | ORAL | Status: DC
  Administered 2015-12-31: 81 mg via ORAL
  Filled 2015-12-30 (×2): qty 1

## 2015-12-30 MED ORDER — FENTANYL CITRATE (PF) 100 MCG/2ML IJ SOLN
INTRAMUSCULAR | Status: DC | PRN
  Administered 2015-12-30 (×4): 50 ug via INTRAVENOUS

## 2015-12-30 MED ORDER — SUGAMMADEX SODIUM 200 MG/2ML IV SOLN
INTRAVENOUS | Status: AC
  Filled 2015-12-30: qty 2

## 2015-12-30 MED ORDER — ENOXAPARIN SODIUM 40 MG/0.4ML ~~LOC~~ SOLN
40.0000 mg | SUBCUTANEOUS | Status: DC
  Administered 2015-12-31: 40 mg via SUBCUTANEOUS
  Filled 2015-12-30: qty 0.4

## 2015-12-30 MED ORDER — CHLORTHALIDONE 25 MG PO TABS: 25.0000 mg | ORAL_TABLET | Freq: Every day | ORAL | Status: DC

## 2015-12-30 MED ORDER — LACTATED RINGERS IV SOLN
INTRAVENOUS | Status: DC | PRN
  Administered 2015-12-30: 1000 mL

## 2015-12-30 MED ORDER — PROPOFOL 10 MG/ML IV BOLUS
INTRAVENOUS | Status: DC | PRN
  Administered 2015-12-30: 110 mg via INTRAVENOUS

## 2015-12-30 MED ORDER — PROPOFOL 10 MG/ML IV BOLUS
INTRAVENOUS | Status: AC
  Filled 2015-12-30: qty 20

## 2015-12-30 MED ORDER — STERILE WATER FOR IRRIGATION IR SOLN
Status: DC | PRN
  Administered 2015-12-30: 1000 mL

## 2015-12-30 MED ORDER — GLYCOPYRROLATE 0.2 MG/ML IJ SOLN
INTRAMUSCULAR | Status: DC | PRN
  Administered 2015-12-30: 0.2 mg via INTRAVENOUS

## 2015-12-30 MED ORDER — FENTANYL CITRATE (PF) 100 MCG/2ML IJ SOLN
INTRAMUSCULAR | Status: AC
  Filled 2015-12-30: qty 2

## 2015-12-30 MED ORDER — ROCURONIUM BROMIDE 100 MG/10ML IV SOLN
INTRAVENOUS | Status: AC
  Filled 2015-12-30: qty 1

## 2015-12-30 MED ORDER — METOCLOPRAMIDE HCL 5 MG/ML IJ SOLN: 10.0000 mg | Freq: Once | INTRAMUSCULAR | Status: DC | PRN

## 2015-12-30 MED ORDER — LEVOTHYROXINE SODIUM 50 MCG PO TABS
50.0000 ug | ORAL_TABLET | Freq: Every day | ORAL | Status: DC
  Administered 2015-12-31: 50 ug via ORAL
  Filled 2015-12-30: qty 1

## 2015-12-30 MED ORDER — HYDROMORPHONE HCL 1 MG/ML IJ SOLN
0.2000 mg | INTRAMUSCULAR | Status: DC | PRN
  Administered 2015-12-30: 0.2 mg via INTRAVENOUS
  Filled 2015-12-30: qty 1

## 2015-12-30 MED ORDER — OXYCODONE-ACETAMINOPHEN 5-325 MG PO TABS: 1.0000 | ORAL_TABLET | ORAL | Status: DC | PRN

## 2015-12-30 MED ORDER — DEXAMETHASONE SODIUM PHOSPHATE 10 MG/ML IJ SOLN
INTRAMUSCULAR | Status: DC | PRN
  Administered 2015-12-30: 5 mg via INTRAVENOUS

## 2015-12-30 MED ORDER — LACTATED RINGERS IV SOLN
INTRAVENOUS | Status: DC
  Administered 2015-12-30 (×2): via INTRAVENOUS
  Administered 2015-12-30: 1000 mL via INTRAVENOUS

## 2015-12-30 SURGICAL SUPPLY — 57 items
APL ESCP 34 STRL LF DISP (HEMOSTASIS) ×1
APL SKNCLS STERI-STRIP NONHPOA (GAUZE/BANDAGES/DRESSINGS) ×1
APPLICATOR SURGIFLO ENDO (HEMOSTASIS) ×1 IMPLANT
BAG SPEC RTRVL LRG 6X4 10 (ENDOMECHANICALS)
BENZOIN TINCTURE PRP APPL 2/3 (GAUZE/BANDAGES/DRESSINGS) ×2 IMPLANT
CHLORAPREP W/TINT 26ML (MISCELLANEOUS) ×2 IMPLANT
COVER SURGICAL LIGHT HANDLE (MISCELLANEOUS) ×2 IMPLANT
COVER TIP SHEARS 8 DVNC (MISCELLANEOUS) ×1 IMPLANT
COVER TIP SHEARS 8MM DA VINCI (MISCELLANEOUS) ×1
DRAPE ARM DVNC X/XI (DISPOSABLE) ×4 IMPLANT
DRAPE COLUMN DVNC XI (DISPOSABLE) ×1 IMPLANT
DRAPE DA VINCI XI ARM (DISPOSABLE) ×4
DRAPE DA VINCI XI COLUMN (DISPOSABLE) ×1
DRAPE SHEET LG 3/4 BI-LAMINATE (DRAPES) ×4 IMPLANT
DRAPE SURG IRRIG POUCH 19X23 (DRAPES) ×2 IMPLANT
DRSG TEGADERM 2-3/8X2-3/4 SM (GAUZE/BANDAGES/DRESSINGS) ×1 IMPLANT
ELECT REM PT RETURN 9FT ADLT (ELECTROSURGICAL) ×2
ELECTRODE REM PT RTRN 9FT ADLT (ELECTROSURGICAL) ×1 IMPLANT
GAUZE SPONGE 2X2 8PLY STRL LF (GAUZE/BANDAGES/DRESSINGS) IMPLANT
GLOVE BIO SURGEON STRL SZ 6.5 (GLOVE) ×10 IMPLANT
GLOVE BIOGEL PI IND STRL 7.0 (GLOVE) ×2 IMPLANT
GLOVE BIOGEL PI INDICATOR 7.0 (GLOVE) ×2
GOWN STRL REUS W/ TWL LRG LVL3 (GOWN DISPOSABLE) ×3 IMPLANT
GOWN STRL REUS W/TWL LRG LVL3 (GOWN DISPOSABLE) ×6
HOLDER FOLEY CATH W/STRAP (MISCELLANEOUS) ×2 IMPLANT
IRRIG SUCT STRYKERFLOW 2 WTIP (MISCELLANEOUS) ×2
IRRIGATION SUCT STRKRFLW 2 WTP (MISCELLANEOUS) ×1 IMPLANT
KIT BASIN OR (CUSTOM PROCEDURE TRAY) ×2 IMPLANT
KIT PROCEDURE DA VINCI SI (MISCELLANEOUS) ×1
KIT PROCEDURE DVNC SI (MISCELLANEOUS) IMPLANT
MANIPULATOR UTERINE 4.5 ZUMI (MISCELLANEOUS) ×2 IMPLANT
OCCLUDER COLPOPNEUMO (BALLOONS) ×2 IMPLANT
PAD POSITIONING PINK XL (MISCELLANEOUS) ×2 IMPLANT
PORT ACCESS TROCAR AIRSEAL 12 (TROCAR) IMPLANT
PORT ACCESS TROCAR AIRSEAL 5M (TROCAR) ×1
POUCH SPECIMEN RETRIEVAL 10MM (ENDOMECHANICALS) IMPLANT
SEAL CANN UNIV 5-8 DVNC XI (MISCELLANEOUS) ×4 IMPLANT
SEAL XI 5MM-8MM UNIVERSAL (MISCELLANEOUS) ×4
SET TRI-LUMEN FLTR TB AIRSEAL (TUBING) ×1 IMPLANT
SHEET LAVH (DRAPES) ×2 IMPLANT
SOLUTION ANTI FOG 6CC (MISCELLANEOUS) ×1 IMPLANT
SOLUTION ELECTROLUBE (MISCELLANEOUS) ×2 IMPLANT
SPONGE GAUZE 2X2 STER 10/PKG (GAUZE/BANDAGES/DRESSINGS) ×1
STRIP CLOSURE SKIN 1/2X4 (GAUZE/BANDAGES/DRESSINGS) ×2 IMPLANT
SURGIFLO W/THROMBIN 8M KIT (HEMOSTASIS) ×1 IMPLANT
SUT VIC AB 0 CT1 27 (SUTURE) ×2
SUT VIC AB 0 CT1 27XBRD ANTBC (SUTURE) ×1 IMPLANT
SUT VIC AB 4-0 PS2 27 (SUTURE) ×4 IMPLANT
SYR 50ML LL SCALE MARK (SYRINGE) ×2 IMPLANT
TAPE STRIPS DRAPE STRL (GAUZE/BANDAGES/DRESSINGS) ×1 IMPLANT
TOWEL OR 17X26 10 PK STRL BLUE (TOWEL DISPOSABLE) ×4 IMPLANT
TRAP SPECIMEN MUCOUS 40CC (MISCELLANEOUS) ×2 IMPLANT
TRAY FOLEY W/METER SILVER 14FR (SET/KITS/TRAYS/PACK) ×2 IMPLANT
TRAY LAPAROSCOPIC (CUSTOM PROCEDURE TRAY) ×2 IMPLANT
TROCAR BLADELESS OPT 5 100 (ENDOMECHANICALS) ×2 IMPLANT
UNDERPAD 30X30 INCONTINENT (UNDERPADS AND DIAPERS) ×2 IMPLANT
WATER STERILE IRR 1500ML POUR (IV SOLUTION) ×4 IMPLANT

## 2015-12-30 NOTE — Op Note (Signed)
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   Postoperative Diagnosis: same.   Surgery: Total robotic hysterectomy bilateral salpingo-oophorectomy, left pelvic, right para-aortic and bifurcation SLN removal  Surgeons:  Preslee Regas A. Alycia Rossetti, MD; Lahoma Crocker, MD   Anesthesia: General   Estimated blood loss: 50 ml   IVF: 2000 ml   Urine output: 585 ml   Complications: None   Pathology: Uterus, cervix, bilateral tubes and ovaries, right para-aortic, left pelvic and bifurcation SLN  Operative findings:  1) Colon adherent to posterior uterus, left sidewall and appendix 2) 2 right para-aortic nodes for SLN (high and PA node) 3) Aortic bifurcation nodes 4) Left obturator node  Procedure: The patient was identified in the preoperative holding area. Informed consent was signed on the chart. Patient was seen history was reviewed and exam was performed.   The patient was then taken to the operating room and placed in the supine position with SCD hose on. She was then placed in the dorsolithotomy position. Her arms were tucked at her side with appropriate precautions on the gel pad. General anesthesia was then induced without difficulty. Shoulder blocks were then placed in the usual fashion with appropriate precautions. A OG-tube was placed to suction. First timeout was performed to confirm the patient, procedure, antibiotic, allergy status, estimated blood loss and OR time. The perineum was then prepped in the usual fashion with Betadine. A 14 French Foley was inserted into the bladder under sterile conditions. A sterile speculum was placed in the vagina. The cervix was without lesions. The cervix was grasped with a single-tooth tenaculum and injectecd with ICG per protocol.The cervix was dilated without difficulty. A ZUMI with a medium Koe ring was placed without difficulty. The abdomen was then prepped  with 1 Chlor prep sponge per protocol.   Patient was then draped after the prep was dried. Second timeout was performed to confirm the above. After again confirming OG tube placement and it was to suction. A stab-wound was made in left upper quadrant 2 cm below the costal margin on the left in the midclavicular line. A 5 mm operative report was used to assure intra-abdominal placement. The abdomen was insufflated. At this point all points during the procedure the patient's intra-abdominal pressure was not increased over 15 mm of mercury. After insufflation was complete, the patient was placed in deep Trendelenburg position. 25 cm above the pubic symphysis that area was marked the camera port. Bilateral robotic ports were marked 10 cm from the midline incision and a fourth trocar was marked in the LLQ. Under direct visualization each of the trochars was placed into the abdomen. The small bowel was folded on its mesentery to allow visualization to the pelvis. The 5 mm LUQ port was then converted to a 10/12 port under direct visualization.  After assuring adequate visualization, the robot was then docked in the usual fashion. Under direct visualization the robotic instruments replaced.   The round ligament on the patient's right side was transected with monopolar cautery. The anterior and posterior leaves of the broad ligament were then taken down in the usual fashion. The ureter was identified on the medial leaf of the broad ligament. A window was made between the IP and the ureter. The para-vesical space was opened as was the peri-rectal space. Using the FIRES protocol, no distinct channels was identified in the right pelvic region. The obturator nerve was seen and there was a slight green  tinge, but when the SLN candidate was removed, it was consistent with adipose tissue and not nodal tissue, it was not submitted as a SLN  We extended our incision superiorly to the level of the common iliac artery on the  right and the ureter was mobilized laterally. There were two channels along the right common iliac artery to that aorta identified. They were followed to high PA node (SLN#1) which was removed from the aorta and vena cava with dissection and pin point cautery. A lower channel was followed for SLN # 2. Both were removed and submitted. Another channel was identified coming from the sacral promontory. This lead to a large bundle of 3 nodes that were all ICG +. The IMA was identified and the nodal bundle was immediately under the IMA towards the left common. The nodes were dissected off of the lower aorta, left common iliac artery and left common iliac vein. The specimen was placed in an endocatch bag and labeled as SLN #3.   The round ligament on the patient's left side was transected with monopolar cautery. The anterior and posterior leaves of the broad ligament were then taken down in the usual fashion. The ureter was identified on the medial leaf of the broad ligament. A window was made between the IP and the ureter. The para-vesical space was opened as was the peri-rectal space. Using the FIRES protocol, a distinct channel was identified going under the left iliac vein. The obturator nerve was and followed. The nodal bundle was dissected from the inferior aspect of the vein to the bifurcation of the left common iliac artery. The obturator was seen throughout. This nodal bundle was identified as SLN #4 and placed in an endocatch bag.  The IP on the left was coagulated with bipolar cautery and transected. The posterior leaf of the broad ligament was taken down to the level of the KOH ring. The bladder flap was created using meticulous dissection and pinpoint cautery. The uterine vessels were coagulated with bipolar cautery. The uterine vessels were then transected and the C loop was created. The same procedure was performed on the patient's right side.   The pneumo-occulder in the vagina was then insufflated.  The colpotomy was then created in the usual fashion. All specimens were delivered to the vagina. The vaginal cuff was closed with a running 0 Vicryl on CT 1 suture. The abdomen and pelvis were copiously irrigated and noted to be hemostatic. The robotic instruments were removed under direct visualization as were the robotic trochars. The pneumoperitoneum was removed. The patient was then taken out of the Trendelenburg position. Using of 0 Vicryl on a UR 6 the subcutaneous tissues of the port in the left upper quadrant was reapproximated. The skin was closed using 4-0 Vicryl. Steri-Strips and benzoin were applied. The pneumo occluded balloon was removed from the vagina. The vagina was swabbed and noted to be hemostatic.   All instrument needle and Ray-Tec counts were correct x2. The patient tolerated the procedure well and was taken to the recovery room in stable condition. This is Nancy Marus dictating an operative note on patient Shannon Scott.

## 2015-12-30 NOTE — Interval H&P Note (Signed)
History and Physical Interval Note:  12/30/2015 12:40 PM  Shannon Scott  has presented today for surgery, with the diagnosis of endomentrial cancer  The various methods of treatment have been discussed with the patient and family. After consideration of risks, benefits and other options for treatment, the patient has consented to  Procedure(s): XI ROBOTIC ASSISTED TOTAL HYSTERECTOMY WITH BILATERAL SALPINGO OOPHORECTOMY WITH SENTAL LYMPH NODE BIOPSY (Bilateral) as a surgical intervention .  The patient's history has been reviewed, patient examined, no change in status, stable for surgery.  I have reviewed the patient's chart and labs.  Questions were answered to the patient's satisfaction.     [place] A.

## 2015-12-30 NOTE — Anesthesia Preprocedure Evaluation (Signed)
Anesthesia Evaluation  Patient identified by MRN, date of birth, ID band Patient awake    Reviewed: Allergy & Precautions, NPO status , Patient's Chart, lab work & pertinent test results  Airway Mallampati: II  TM Distance: >3 FB Neck ROM: Full    Dental no notable dental hx.    Pulmonary asthma ,    Pulmonary exam normal breath sounds clear to auscultation       Cardiovascular + CAD and + CABG (2005)  Normal cardiovascular exam+ Valvular Problems/Murmurs (mild AS) AS  Rhythm:Regular Rate:Normal     Neuro/Psych TIAnegative psych ROS   GI/Hepatic negative GI ROS, Neg liver ROS,   Endo/Other  negative endocrine ROS  Renal/GU negative Renal ROS  negative genitourinary   Musculoskeletal negative musculoskeletal ROS (+)   Abdominal   Peds negative pediatric ROS (+)  Hematology negative hematology ROS (+)   Anesthesia Other Findings   Reproductive/Obstetrics negative OB ROS                             Anesthesia Physical Anesthesia Plan  ASA: II  Anesthesia Plan: General   Post-op Pain Management:    Induction: Intravenous  Airway Management Planned: Oral ETT  Additional Equipment:   Intra-op Plan:   Post-operative Plan: Extubation in OR  Informed Consent: I have reviewed the patients History and Physical, chart, labs and discussed the procedure including the risks, benefits and alternatives for the proposed anesthesia with the patient or authorized representative who has indicated his/her understanding and acceptance.   Dental advisory given  Plan Discussed with: CRNA  Anesthesia Plan Comments:         Anesthesia Quick Evaluation

## 2015-12-30 NOTE — H&P (View-Only) (Signed)
Consult Note: Gyn-Onc  Consult was requested by Dr. Benjie Karvonen for the evaluation of Shannon Scott 75 y.o. female  CC:  Chief Complaint  Patient presents with  . endometrial cancer    New consultation    Assessment/Plan:  Shannon Scott  is a 75 y.o.  year old with grade "1-2" endometrioid endometrial cancer on endometrial biopsy. She also has familial hyperlipidemia and a history of CABG in 2005. Her hyperlipidemia is not well controlled due to her aversion to drug-related side effects and unwillingness to take medications for this. Additionally she has poorly controlled HTN (also suboptimally treated due to patient intolerance to side effects).   A detailed discussion was held with the patient and her family with regard to to her endometrial cancer diagnosis. We discussed the standard management options for uterine cancer which includes surgery followed possibly by adjuvant therapy depending on the results of surgery. I discussed that her advanced age increases the liklihood that she may meet high/intermediate risk factors for recurrence and the chances that we will recommend adjuvant radiation.  The options for surgical management include a hysterectomy and removal of the tubes and ovaries possibly with lymph node sampling. A minimally invasive approach including a robotic hysterectomy or laparoscopic hysterectomy have benefits including shorter hospital stay, recovery time and better wound healing.   We discussed the risks of surgery in general including infection, bleeding, damage to surrounding structures (including bowel, bladder, ureters, nerves or vessels), and the postoperative risks of PE/ DVT, and lymphedema. I extensively reviewed the additional risks of robotic hysterectomy including possible need for conversion to open laparotomy.  I discussed positioning during surgery of trendelenberg and risks of minor facial swelling and care we take in preoperative positioning.  After  counseling and consideration of her options, she desires to proceed with robotic assisted total hysterectomy, BSO, sentinel lymph node biopsy.   I explained to the patient that she is at substantially higher risk for perioperative complications such as MI or CVA due to her peripheral vascular disease, coronary disease and carototid disease in addition to her hypertension. I described that these risks could be reduced with preoperative optimization including change/initiation of medications. I explained that this is safe from a uterine cancer standpoint.  The patient became very angry at the suggestion of waiting for optimization of her cardiovascular risks. She is extremely afraid of spread of disease. She is unwilling to delay her surgery to reduce her risk of CVA/MI etc.   In order to expedite her surgery it will be performed by my partner, Dr Nancy Marus.  She will be seen by anesthesia for preoperative clearance and discussion of postoperative pain management. We will avoid narcotics as she is very sensitive to these.  We will contact her cardiologist to ensure all meds are optimized as much as possible in the next 13 days prior to scheduled surgery.  She was given the opportunity to ask questions, which were answered to her satisfaction, and she is agreement with the above mentioned plan of care.  HPI: Shannon Scott is a 75 year old G2P2 who is seen in consultation at the request of Dr Benjie Karvonen for grade 1-2 endometrial cancer. The patient began noticing postmenopausal bleeding in June, 2017. She saw her primary care physician who determined there was no blood in the urine, and she was then referred to Dr Benjie Karvonen who, on 11/21/15 performed a TVUS which showed a uterus measuring 6x2.7x3.3cm with normal ovaries. There was a thickened endometrium of  6.45m. A pipelle endometrial biopsy was performed on 11/21/15 which showed FIGO grade 1-2 endometrial cancer.  The pap from the same date was normal.  The  patient has a history of familial hypercholesterolemia. She has a history of a 3 vessel CABG in 2005. She has never had a CVA, and recent carotid dopplers show 50% occlusion. She reports symptoms concerning for LE claudication with ambulation and stairs. She is not a smoker.  The patient does not have well controlled hypercholesterolemia or HTN due to her unwillingness to take medications that give side effects. "I would rather die from my own stuff rather than your medications". She does not take aspirin or plavix because she doesn't like the petechiae it causes on her skin.  Her surgical history is significant for a CABG in 2005, 2 cesarean sections and a tubal ligation.  Current Meds:  Outpatient Encounter Prescriptions as of 12/17/2015  Medication Sig  . aspirin EC 81 MG tablet Take 81 mg by mouth daily.  .Marland Kitchenlevothyroxine (SYNTHROID, LEVOTHROID) 50 MCG tablet Take 50 mcg by mouth daily.   .Marland Kitchenibuprofen (ADVIL) 200 MG tablet Take 200 mg by mouth every 6 (six) hours as needed for pain. Reported on 12/17/2015  . loratadine (CLARITIN) 10 MG tablet Take 10 mg by mouth daily as needed for allergies. Reported on 12/17/2015  . [DISCONTINUED] isosorbide-hydrALAZINE (BIDIL) 20-37.5 MG tablet Take 1 tablet by mouth daily.   No facility-administered encounter medications on file as of 12/17/2015.    Allergy:  Allergies  Allergen Reactions  . Statins     Myalgias and memory problems  . Ciprofloxacin Itching    Splotchy redness with itching during IV infusion localized to arm.    Social Hx:   Social History   Social History  . Marital Status: Widowed    Spouse Name: N/A  . Number of Children: 2  . Years of Education: N/A   Occupational History  .     Social History Main Topics  . Smoking status: Never Smoker   . Smokeless tobacco: Never Used  . Alcohol Use: No  . Drug Use: No  . Sexual Activity: Not on file   Other Topics Concern  . Not on file   Social History Narrative    Past  Surgical Hx:  Past Surgical History  Procedure Laterality Date  . Coronary artery bypass graft  03/12/2004    LIMA to LAD, SVG to circumflex, SVG to PDA  . Carotid doppler  02/2012    40-59% right int carotid artery stenosis; 60-79% L int carotid stenosis; L carotid bruit  . Transthoracic echocardiogram  04/2007    EF>55%; mild MR; mild-mod TR; mild pulm HTN; mild calcification of aortiv valve leaflets with mild valvular aortic stenosis    Past Medical Hx:  Past Medical History  Diagnosis Date  . Dyslipidemia     intolerant to statins, welchol, niacin, zetia  . CAD (coronary artery disease)   . Bilateral carotid artery disease (HCC)     L carotid bruit  . Hypertension   . History of nuclear stress test 04/24/2012    lexiscan; normal study    Past Gynecological History:  See HPI  No LMP recorded. Patient is postmenopausal.  Family Hx:  Family History  Problem Relation Age of Onset  . Hypertension Mother   . Heart disease Mother   . Stroke Father   . Heart disease Father   . Kidney disease Brother   . Heart disease Brother  also HTN, hyperlipidemia  . Heart attack Brother   . Stroke Sister     x2  . Hypertension Sister   . Heart disease Sister     Review of Systems:  Constitutional  Feels well,    ENT Normal appearing ears and nares bilaterally Skin/Breast  No rash, sores, jaundice, itching, dryness Cardiovascular  No chest pain, no shortness of breath, or edema  Pulmonary  No cough or wheeze.  Gastro Intestinal  No nausea, vomitting, or diarrhoea. No bright red blood per rectum, no abdominal pain, change in bowel movement, or constipation.  Genito Urinary  No frequency, urgency, dysuria, + postmenopausal bleeding Musculo Skeletal  No myalgia, arthralgia, joint swelling. + lower extremity pain with ambulation Neurologic  No weakness, numbness, change in gait,  Psychology  No depression, anxiety, insomnia.   Vitals:  Blood pressure 182/63, pulse 62,  temperature 98.4 F (36.9 C), temperature source Oral, resp. rate 18, height 5' 1"  (1.549 m), weight 149 lb 8 oz (67.813 kg), SpO2 98 %. BMI 28kg/m2  Physical Exam: WD in NAD Neck  Supple NROM, without any enlargements.  Lymph Node Survey No cervical supraclavicular or inguinal adenopathy Cardiovascular  Pulse normal rate, regularity and rhythm. S1 and S2 normal.  Lungs  Clear to auscultation bilateraly, without wheezes/crackles/rhonchi. Good air movement.  Skin  No rash/lesions/breakdown  Psychiatry  Alert and oriented to person, place, and time  Abdomen  Normoactive bowel sounds, abdomen soft, non-tender and non-obese, mildly overweight without evidence of hernia.  Back No CVA tenderness Genito Urinary  Vulva/vagina: Normal external female genitalia.   No lesions. No discharge or bleeding.  Bladder/urethra:  No lesions or masses, well supported bladder  Vagina: normal  Cervix: Normal appearing, no lesions.  Uterus: Small, mobile, no parametrial involvement or nodularity.  Adnexa: no palpable masses. Rectal  deferred.  Extremities  No bilateral cyanosis, clubbing or edema.   Donaciano Eva, MD  12/17/2015, 11:23 AM

## 2015-12-30 NOTE — Transfer of Care (Signed)
Immediate Anesthesia Transfer of Care Note  Patient: MINNETTA PROTHRO  Procedure(s) Performed: Procedure(s): XI ROBOTIC ASSISTED TOTAL HYSTERECTOMY WITH BILATERAL SALPINGO OOPHORECTOMY WITH SENTAL LYMPH NODE BIOPSY (Bilateral)  Patient Location: PACU  Anesthesia Type:General  Level of Consciousness: sedated  Airway & Oxygen Therapy: Patient Spontanous Breathing and Patient connected to face mask oxygen  Post-op Assessment: Report given to RN and Post -op Vital signs reviewed and stable  Post vital signs: Reviewed and stable  Last Vitals:  Vitals:   12/30/15 1022  BP: (!) 170/51  Pulse: (!) 57  Resp: 16  Temp: 36.8 C    Last Pain:  Vitals:   12/30/15 1232  TempSrc:   PainSc: 0-No pain      Patients Stated Pain Goal: 4 ([id number])  Complications: No apparent anesthesia complications

## 2015-12-30 NOTE — Anesthesia Procedure Notes (Signed)
Procedure Name: Intubation Date/Time: 12/30/2015 1:37 PM Performed by: Sherian Maroon A Pre-anesthesia Checklist: Patient identified, Emergency Drugs available, Patient being monitored, Suction available and Timeout performed Patient Re-evaluated:Patient Re-evaluated prior to inductionOxygen Delivery Method: Circle system utilized Preoxygenation: Pre-oxygenation with 100% oxygen Intubation Type: IV induction Ventilation: Mask ventilation without difficulty Laryngoscope Size: Mac and 3 Grade View: Grade II Tube type: Oral Tube size: 7.5 mm Number of attempts: 1 Airway Equipment and Method: Stylet Placement Confirmation: ETT inserted through vocal cords under direct vision,  positive ETCO2 and breath sounds checked- equal and bilateral Secured at: 20 cm Tube secured with: Tape Dental Injury: Teeth and Oropharynx as per pre-operative assessment

## 2015-12-31 ENCOUNTER — Encounter (HOSPITAL_COMMUNITY): Payer: Self-pay | Admitting: Gynecologic Oncology

## 2015-12-31 DIAGNOSIS — Z7982 Long term (current) use of aspirin: Secondary | ICD-10-CM | POA: Diagnosis not present

## 2015-12-31 DIAGNOSIS — I10 Essential (primary) hypertension: Secondary | ICD-10-CM | POA: Diagnosis not present

## 2015-12-31 DIAGNOSIS — C541 Malignant neoplasm of endometrium: Secondary | ICD-10-CM | POA: Diagnosis not present

## 2015-12-31 DIAGNOSIS — E784 Other hyperlipidemia: Secondary | ICD-10-CM | POA: Diagnosis not present

## 2015-12-31 DIAGNOSIS — I2510 Atherosclerotic heart disease of native coronary artery without angina pectoris: Secondary | ICD-10-CM | POA: Diagnosis not present

## 2015-12-31 DIAGNOSIS — C542 Malignant neoplasm of myometrium: Secondary | ICD-10-CM | POA: Diagnosis not present

## 2015-12-31 DIAGNOSIS — Z951 Presence of aortocoronary bypass graft: Secondary | ICD-10-CM | POA: Diagnosis not present

## 2015-12-31 LAB — CBC
HCT: 34.2 % — ABNORMAL LOW (ref 36.0–46.0)
Hemoglobin: 11.4 g/dL — ABNORMAL LOW (ref 12.0–15.0)
MCH: 28.1 pg (ref 26.0–34.0)
MCHC: 33.3 g/dL (ref 30.0–36.0)
MCV: 84.2 fL (ref 78.0–100.0)
Platelets: 245 10*3/uL (ref 150–400)
RBC: 4.06 MIL/uL (ref 3.87–5.11)
RDW: 13.7 % (ref 11.5–15.5)
WBC: 9.9 10*3/uL (ref 4.0–10.5)

## 2015-12-31 LAB — BASIC METABOLIC PANEL
Anion gap: 6 (ref 5–15)
BUN: 7 mg/dL (ref 6–20)
CO2: 28 mmol/L (ref 22–32)
Calcium: 8.8 mg/dL — ABNORMAL LOW (ref 8.9–10.3)
Chloride: 100 mmol/L — ABNORMAL LOW (ref 101–111)
Creatinine, Ser: 0.84 mg/dL (ref 0.44–1.00)
GFR calc Af Amer: >60 mL/min (ref >60)
GFR calc non Af Amer: >60 mL/min (ref >60)
Glucose, Bld: 127 mg/dL — ABNORMAL HIGH (ref 65–99)
Potassium: 3.3 mmol/L — ABNORMAL LOW (ref 3.5–5.1)
Sodium: 134 mmol/L — ABNORMAL LOW (ref 135–145)

## 2015-12-31 MED ORDER — TRAMADOL HCL 50 MG PO TABS: 50.0000 mg | ORAL_TABLET | Freq: Four times a day (QID) | ORAL | 0 refills | Status: DC | PRN

## 2015-12-31 NOTE — Discharge Summary (Addendum)
Physician Discharge Summary  Patient ID: Shannon Scott MRN: [id number] DOB/AGE: 06/16/1940 75 y.o.  Admit date: 12/30/2015 Discharge date: 12/31/2015  Admission Diagnoses: Endometrial cancer [facility])  Discharge Diagnoses:  Principal Problem:   Endometrial cancer [facility])   Discharged Condition:  The patient is in good condition and stable for discharge.    Hospital Course: On 12/30/2015, the patient underwent the following: Procedure(s): XI ROBOTIC ASSISTED TOTAL HYSTERECTOMY WITH BILATERAL SALPINGO OOPHORECTOMY WITH SENTINEL LYMPH NODE BIOPSY.   The postoperative course was uneventful.  She was discharged to home on postoperative day 1 tolerating a regular diet, minimal pain.  Due to urinary retention, foley was replaced and patient was discharged home with foley.  Our office will contact her to come in for voiding trial in one to two days in the office.  Consults: None  Significant Diagnostic Studies: None  Treatments: surgery: see above  Discharge Exam: Blood pressure (!) 122/40, pulse 60, temperature 98.2 F (36.8 C), temperature source Oral, resp. rate 15, height 5\' 1"  (1.549 m), weight 147 lb (66.7 kg), SpO2 98 %. General appearance: alert, cooperative and no distress Resp: clear to auscultation bilaterally Cardio: regular rate noted, mild systolic murmur noted, no clicks or rubs GI: soft, non-tender; bowel sounds normal; no masses,  no organomegaly Extremities: extremities normal, atraumatic, no cyanosis or edema Incision/Wound: Lap sites to the abdomen, dressings removed, steri strips in place with no drainage or erythema noted  Disposition: 01-Home or Self Care  Discharge Instructions    Call MD for:  difficulty breathing, headache or visual disturbances    Complete by:  As directed   Call MD for:  extreme fatigue    Complete by:  As directed   Call MD for:  hives    Complete by:  As directed   Call MD for:  persistant dizziness or light-headedness    Complete by:  As  directed   Call MD for:  persistant nausea and vomiting    Complete by:  As directed   Call MD for:  redness, tenderness, or signs of infection (pain, swelling, redness, odor or green/yellow discharge around incision site)    Complete by:  As directed   Call MD for:  severe uncontrolled pain    Complete by:  As directed   Call MD for:  temperature >100.4    Complete by:  As directed   Diet - low sodium heart healthy    Complete by:  As directed   Driving Restrictions    Complete by:  As directed   No driving for 1 week if applicable.  Do not take narcotics and drive.   Increase activity slowly    Complete by:  As directed   Lifting restrictions    Complete by:  As directed   No lifting greater than 10 lbs.   Sexual Activity Restrictions    Complete by:  As directed   No sexual activity, nothing in the vagina, for 8 weeks.       Medication List    TAKE these medications   ADVIL 200 MG tablet Generic drug:  ibuprofen Take 200 mg by mouth every 6 (six) hours as needed for pain. Reported on 12/17/2015   aspirin EC 81 MG tablet Take 81 mg by mouth daily.   EDARBYCLOR 40-25 MG Tabs Generic drug:  Azilsartan-Chlorthalidone Take 1 tablet by mouth daily. nightly   levothyroxine 50 MCG tablet Commonly known as:  SYNTHROID, LEVOTHROID Take 50 mcg by mouth daily.   loratadine  10 MG tablet Commonly known as:  CLARITIN Take 10 mg by mouth daily as needed for allergies. Reported on 12/17/2015   metoprolol succinate 50 MG 24 hr tablet Commonly known as:  TOPROL-XL Take 50 mg by mouth daily. nightly   traMADol 50 MG tablet Commonly known as:  ULTRAM Take 1-2 tablets (50-100 mg total) by mouth every 6 (six) hours as needed.      Follow-up Information    GEHRIG,PAOLA A., MD Follow up on 01/28/2016.   Specialty:  Gynecologic Oncology Why:  at 10:45am at the [facility].   Contact information: [address] [place] [address] [phone]           Greater than thirty  minutes were spend for face to face discharge instructions and discharge orders/summary in EPIC.   Signed: Krystel Fletchall DEAL 12/31/2015, 9:54 AM

## 2015-12-31 NOTE — Progress Notes (Signed)
Pt to d/c home. AVS reviewed and "My Chart" discussed with pt. Pt capable of verbalizing medications, signs and symptoms of infection, how to empty and change catheter bag, and follow-up appointments. Remains hemodynamically stable. No signs and symptoms of distress. Educated pt to return to ER in the case of SOB, dizziness, or chest pain.

## 2015-12-31 NOTE — Progress Notes (Signed)
Pt decided to go home with an indwelling foley catheter. Patient knows to schedule an appointment within the next couple of days for follow-up. Leg bag and extra drainage bag given with instructions on draining, and changing the bag from leg to drainage.

## 2015-12-31 NOTE — Progress Notes (Signed)
Pt has not been able to void this morning. Bladder scan would not pick up any urine amount, but patient is reporting pressure. NP notified of this. Instructions to give patient the option to have a foley placed and to come into the office in one to two days for assessment, or, I&O cath, d/c, and instruct the patient on I&O cathing self if unable to void in 4-6 hours. Patient was informed and she does not want to cath self. Asking to stay until 6 or 7 to void. Patient informed that she needs to be cathed now for safety regardless, and has to be d/c'd today. Patient attempting to void again now.

## 2015-12-31 NOTE — Discharge Instructions (Signed)

## 2015-12-31 NOTE — Anesthesia Postprocedure Evaluation (Signed)
Anesthesia Post Note  Patient: Shannon Scott  Procedure(s) Performed: Procedure(s) (LRB): XI ROBOTIC ASSISTED TOTAL HYSTERECTOMY WITH BILATERAL SALPINGO OOPHORECTOMY WITH SENTAL LYMPH NODE BIOPSY (Bilateral)  Patient location during evaluation: PACU Anesthesia Type: General Level of consciousness: awake and alert Pain management: pain level controlled Vital Signs Assessment: post-procedure vital signs reviewed and stable Respiratory status: spontaneous breathing, nonlabored ventilation, respiratory function stable and patient connected to nasal cannula oxygen Cardiovascular status: blood pressure returned to baseline and stable Postop Assessment: no signs of nausea or vomiting Anesthetic complications: no    Last Vitals:  Vitals:   12/31/15 0156 12/31/15 0545  BP: (!) 107/30 (!) 122/40  Pulse: 74 60  Resp: 16 15  Temp: 37.3 C 36.8 C    Last Pain:  Vitals:   12/31/15 0549  TempSrc:   PainSc: 1                  Montez Hageman

## 2016-01-01 ENCOUNTER — Observation Stay (HOSPITAL_COMMUNITY)
Admission: EM | Admit: 2016-01-01 | Discharge: 2016-01-02 | Disposition: A | Discharge status: Home / Self Care | Payer: Medicare Other | Attending: Obstetrics & Gynecology | Admitting: Obstetrics & Gynecology

## 2016-01-01 ENCOUNTER — Emergency Department (HOSPITAL_COMMUNITY): Payer: Medicare Other

## 2016-01-01 ENCOUNTER — Encounter (HOSPITAL_COMMUNITY): Payer: Self-pay | Admitting: Emergency Medicine

## 2016-01-01 DIAGNOSIS — E871 Hypo-osmolality and hyponatremia: Secondary | ICD-10-CM | POA: Diagnosis not present

## 2016-01-01 DIAGNOSIS — Z79899 Other long term (current) drug therapy: Secondary | ICD-10-CM | POA: Insufficient documentation

## 2016-01-01 DIAGNOSIS — E7801 Familial hypercholesterolemia: Secondary | ICD-10-CM | POA: Diagnosis not present

## 2016-01-01 DIAGNOSIS — E860 Dehydration: Secondary | ICD-10-CM | POA: Insufficient documentation

## 2016-01-01 DIAGNOSIS — Z90722 Acquired absence of ovaries, bilateral: Secondary | ICD-10-CM | POA: Diagnosis not present

## 2016-01-01 DIAGNOSIS — Z8542 Personal history of malignant neoplasm of other parts of uterus: Secondary | ICD-10-CM | POA: Diagnosis not present

## 2016-01-01 DIAGNOSIS — Z90710 Acquired absence of both cervix and uterus: Secondary | ICD-10-CM | POA: Insufficient documentation

## 2016-01-01 DIAGNOSIS — I10 Essential (primary) hypertension: Secondary | ICD-10-CM | POA: Diagnosis not present

## 2016-01-01 DIAGNOSIS — Z951 Presence of aortocoronary bypass graft: Secondary | ICD-10-CM | POA: Insufficient documentation

## 2016-01-01 DIAGNOSIS — C541 Malignant neoplasm of endometrium: Secondary | ICD-10-CM

## 2016-01-01 DIAGNOSIS — Z9079 Acquired absence of other genital organ(s): Secondary | ICD-10-CM | POA: Diagnosis not present

## 2016-01-01 DIAGNOSIS — E038 Other specified hypothyroidism: Secondary | ICD-10-CM

## 2016-01-01 DIAGNOSIS — R109 Unspecified abdominal pain: Secondary | ICD-10-CM | POA: Diagnosis not present

## 2016-01-01 DIAGNOSIS — E876 Hypokalemia: Secondary | ICD-10-CM | POA: Diagnosis not present

## 2016-01-01 DIAGNOSIS — Z9889 Other specified postprocedural states: Secondary | ICD-10-CM

## 2016-01-01 DIAGNOSIS — I2510 Atherosclerotic heart disease of native coronary artery without angina pectoris: Secondary | ICD-10-CM | POA: Insufficient documentation

## 2016-01-01 DIAGNOSIS — R1110 Vomiting, unspecified: Secondary | ICD-10-CM | POA: Diagnosis not present

## 2016-01-01 DIAGNOSIS — E039 Hypothyroidism, unspecified: Secondary | ICD-10-CM | POA: Diagnosis not present

## 2016-01-01 DIAGNOSIS — R1084 Generalized abdominal pain: Secondary | ICD-10-CM | POA: Diagnosis not present

## 2016-01-01 DIAGNOSIS — R112 Nausea with vomiting, unspecified: Principal | ICD-10-CM | POA: Insufficient documentation

## 2016-01-01 DIAGNOSIS — G8918 Other acute postprocedural pain: Secondary | ICD-10-CM

## 2016-01-01 DIAGNOSIS — I6529 Occlusion and stenosis of unspecified carotid artery: Secondary | ICD-10-CM

## 2016-01-01 HISTORY — DX: Other specified postprocedural states: Z98.890

## 2016-01-01 HISTORY — DX: Nausea with vomiting, unspecified: R11.2

## 2016-01-01 LAB — CBC WITH DIFFERENTIAL/PLATELET
Basophils Absolute: 0 10*3/uL (ref 0.0–0.1)
Basophils Relative: 0 %
Eosinophils Absolute: 0 10*3/uL (ref 0.0–0.7)
Eosinophils Relative: 0 %
HCT: 32.3 % — ABNORMAL LOW (ref 36.0–46.0)
Hemoglobin: 11.3 g/dL — ABNORMAL LOW (ref 12.0–15.0)
Lymphocytes Relative: 9 %
Lymphs Abs: 1 10*3/uL (ref 0.7–4.0)
MCH: 28.7 pg (ref 26.0–34.0)
MCHC: 35 g/dL (ref 30.0–36.0)
MCV: 82 fL (ref 78.0–100.0)
Monocytes Absolute: 0.7 10*3/uL (ref 0.1–1.0)
Monocytes Relative: 7 %
Neutro Abs: 9 10*3/uL — ABNORMAL HIGH (ref 1.7–7.7)
Neutrophils Relative %: 84 %
Platelets: 220 10*3/uL (ref 150–400)
RBC: 3.94 MIL/uL (ref 3.87–5.11)
RDW: 13.2 % (ref 11.5–15.5)
WBC: 10.7 10*3/uL — ABNORMAL HIGH (ref 4.0–10.5)

## 2016-01-01 LAB — BASIC METABOLIC PANEL
Anion gap: 9 (ref 5–15)
Anion gap: 5 (ref 5–15)
BUN: 10 mg/dL (ref 6–20)
BUN: 7 mg/dL (ref 6–20)
CO2: 27 mmol/L (ref 22–32)
CO2: 30 mmol/L (ref 22–32)
Calcium: 8.6 mg/dL — ABNORMAL LOW (ref 8.9–10.3)
Calcium: 8 mg/dL — ABNORMAL LOW (ref 8.9–10.3)
Chloride: 88 mmol/L — ABNORMAL LOW (ref 101–111)
Chloride: 94 mmol/L — ABNORMAL LOW (ref 101–111)
Creatinine, Ser: 0.6 mg/dL (ref 0.44–1.00)
Creatinine, Ser: 0.69 mg/dL (ref 0.44–1.00)
GFR calc Af Amer: >60 mL/min (ref >60)
GFR calc Af Amer: >60 mL/min (ref >60)
GFR calc non Af Amer: >60 mL/min (ref >60)
GFR calc non Af Amer: >60 mL/min (ref >60)
Glucose, Bld: 109 mg/dL — ABNORMAL HIGH (ref 65–99)
Glucose, Bld: 106 mg/dL — ABNORMAL HIGH (ref 65–99)
Potassium: 2.9 mmol/L — ABNORMAL LOW (ref 3.5–5.1)
Potassium: 2.8 mmol/L — ABNORMAL LOW (ref 3.5–5.1)
Sodium: 124 mmol/L — ABNORMAL LOW (ref 135–145)
Sodium: 129 mmol/L — ABNORMAL LOW (ref 135–145)

## 2016-01-01 MED ORDER — ONDANSETRON HCL 4 MG/2ML IJ SOLN
4.0000 mg | Freq: Once | INTRAMUSCULAR | Status: AC
  Administered 2016-01-01: 4 mg via INTRAVENOUS
  Filled 2016-01-01: qty 2

## 2016-01-01 MED ORDER — SODIUM CHLORIDE 0.9 % IV BOLUS (SEPSIS)
1000.0000 mL | Freq: Once | INTRAVENOUS | Status: AC
Start: 2016-01-01 — End: 2016-01-01
  Administered 2016-01-01: 1000 mL via INTRAVENOUS

## 2016-01-01 MED ORDER — SIMETHICONE 80 MG PO CHEW: 80.0000 mg | CHEWABLE_TABLET | Freq: Four times a day (QID) | ORAL | Status: DC | PRN

## 2016-01-01 MED ORDER — SODIUM CHLORIDE 0.9 % IV SOLN: INTRAVENOUS | Status: DC

## 2016-01-01 MED ORDER — POTASSIUM CHLORIDE IN NACL 20-0.9 MEQ/L-% IV SOLN
INTRAVENOUS | Status: DC
  Administered 2016-01-01: 13:00:00 via INTRAVENOUS
  Filled 2016-01-01: qty 1000

## 2016-01-01 MED ORDER — SODIUM CHLORIDE 0.9 % IV SOLN
Freq: Once | INTRAVENOUS | Status: AC
  Administered 2016-01-01: 07:00:00 via INTRAVENOUS

## 2016-01-01 MED ORDER — ASPIRIN EC 81 MG PO TBEC
81.0000 mg | DELAYED_RELEASE_TABLET | Freq: Every day | ORAL | Status: DC
  Administered 2016-01-01 – 2016-01-02 (×2): 81 mg via ORAL
  Filled 2016-01-01 (×3): qty 1

## 2016-01-01 MED ORDER — POTASSIUM CHLORIDE CRYS ER 10 MEQ PO TBCR
10.0000 meq | EXTENDED_RELEASE_TABLET | Freq: Three times a day (TID) | ORAL | Status: DC
  Administered 2016-01-01 – 2016-01-02 (×3): 10 meq via ORAL
  Filled 2016-01-01 (×3): qty 1

## 2016-01-01 MED ORDER — ONDANSETRON HCL 4 MG/2ML IJ SOLN: 4.0000 mg | Freq: Three times a day (TID) | INTRAMUSCULAR | Status: DC | PRN

## 2016-01-01 MED ORDER — SODIUM CHLORIDE 0.9 % IV SOLN
INTRAVENOUS | Status: AC
  Administered 2016-01-01: 16:00:00 via INTRAVENOUS
  Filled 2016-01-01 (×2): qty 1000

## 2016-01-01 MED ORDER — TRAMADOL HCL 50 MG PO TABS: 50.0000 mg | ORAL_TABLET | Freq: Four times a day (QID) | ORAL | Status: DC | PRN

## 2016-01-01 MED ORDER — METOPROLOL SUCCINATE ER 50 MG PO TB24
50.0000 mg | ORAL_TABLET | Freq: Every day | ORAL | Status: DC
  Administered 2016-01-01: 50 mg via ORAL
  Filled 2016-01-01: qty 1

## 2016-01-01 MED ORDER — MORPHINE SULFATE (PF) 4 MG/ML IV SOLN
4.0000 mg | Freq: Once | INTRAVENOUS | Status: AC
  Administered 2016-01-01: 4 mg via INTRAVENOUS
  Filled 2016-01-01: qty 1

## 2016-01-01 MED ORDER — ONDANSETRON HCL 4 MG PO TABS: 4.0000 mg | ORAL_TABLET | Freq: Three times a day (TID) | ORAL | Status: DC | PRN

## 2016-01-01 MED ORDER — FAMOTIDINE 20 MG PO TABS
20.0000 mg | ORAL_TABLET | Freq: Two times a day (BID) | ORAL | Status: DC | PRN
  Administered 2016-01-01: 20 mg via ORAL
  Filled 2016-01-01: qty 1

## 2016-01-01 MED ORDER — LEVOTHYROXINE SODIUM 50 MCG PO TABS
50.0000 ug | ORAL_TABLET | Freq: Every day | ORAL | Status: DC
  Administered 2016-01-02: 50 ug via ORAL
  Filled 2016-01-01: qty 1

## 2016-01-01 MED ORDER — POTASSIUM CHLORIDE IN NACL 20-0.9 MEQ/L-% IV SOLN: INTRAVENOUS | Status: DC

## 2016-01-01 MED ORDER — POTASSIUM CHLORIDE CRYS ER 10 MEQ PO TBCR: 10.0000 meq | EXTENDED_RELEASE_TABLET | Freq: Two times a day (BID) | ORAL | Status: DC

## 2016-01-01 MED ORDER — POTASSIUM CHLORIDE 10 MEQ/100ML IV SOLN
10.0000 meq | INTRAVENOUS | Status: AC
  Administered 2016-01-01 (×2): 10 meq via INTRAVENOUS
  Filled 2016-01-01 (×2): qty 100

## 2016-01-01 MED ORDER — POTASSIUM CHLORIDE IN NACL 20-0.9 MEQ/L-% IV SOLN
INTRAVENOUS | Status: DC
  Administered 2016-01-02: 06:00:00 via INTRAVENOUS
  Filled 2016-01-01: qty 1000

## 2016-01-01 NOTE — Progress Notes (Signed)
S/P Total robotic hysterectomy bilateral salpingo-oophorectomy, left pelvic, right para-aortic and bifurcation SLN removal with Dr. Nancy Marus on 12/30/15.  Currently readmitted.  Presented to ED with nausea/vomiting, poor PO intake, weakness.  Hyponatremia and hypokalemia noted on admission.   Abdomen xray without evidence of bowel obstruction.     Subjective: Patient reports improvement in nausea this am.  Has not taken in significant PO.  Reporting mild gas pains in the upper abdomen.  Reports mild shortness of breath last pm that resolved after having oxygen cannula placed.  Denies chest pain, dyspnea at this time, having a bowel movement.  Daughter at the bedside.  No concerns voiced.    Objective: Vital signs in last 24 hours: Temp:  [97.7 F (36.5 C)-98.6 F (37 C)] 98.5 F (36.9 C) (08/03 0807) Pulse Rate:  [59-71] 66 (08/03 0807) Resp:  [12-16] 16 (08/03 0807) BP: (130-175)/(46-126) 144/55 (08/03 0807) SpO2:  [93 %-99 %] 98 % (08/03 0807) Weight:  [150 lb (68 kg)-158 lb 1.1 oz (71.7 kg)] 158 lb 1.1 oz (71.7 kg) (08/03 0807) Last BM Date: 12/29/15  Intake/Output from previous day: No intake/output data recorded.  Physical Examination: General: alert, cooperative and no distress Resp: clear to auscultation bilaterally Cardio: regular rate noted, mild systolic murmur noted, no clicks or rubs GI: soft, non-tender; bowel sounds normal; no masses,  no organomegaly Extremities: extremities normal, atraumatic, no cyanosis or edema Lap sites to the abdomen with steri strips, two clear tegaderms over two sites, no active drainage or erythema, abdomen soft, mildy tympanic on percussion, active bowel sounds, non-tender on palpation  Labs: WBC/Hgb/Hct/Plts:  10.7/11.3/32.3/220 (08/03 [id number]) BUN/Cr/glu/ALT/AST/amyl/lip:  10/0.60/--/--/--/--/-- (08/03 [id number])  Assessment: 75 y.o. s/p : stable Pain:  Pain is well-controlled on PRN medications.  Heme: Hgb 11.3 and Hct 32.3 this am.  Stable  post-operatively and on this admission.  CV: On telemetry currently.  BP and HR stable. Metoprolol ordered.   GI:  Tolerating po: ice chips at this time.  Nausea resolved.  Abd xray without evidence of obstruction.  Antiemetics ordered PRN.  Advance diet as tolerated  GU: Adequate output in foley.  Plan to remove in the am.    FEN: Hyponatremia and hypokalemia.  Repeat Bmet this afternoon.  Replace depleted electrolytes.  Prophylaxis: intermittent pneumatic compression boots.  Plan: Advance diet as tolerated Bmet at 14:00 today and in the am Change IVF to NS with 20 meq KCL at 75 cc/hr. Remove foley in the am and begin voiding trial Plan for discharge in the am if diet tolerated, voiding, no nausea Encourage ambulation, IS use, deep breathing, and coughing Continue plan of care per Dr. Delsa Sale   LOS: 0 days    Shannon Scott DEAL 01/01/2016, 10:55 AM

## 2016-01-01 NOTE — ED Notes (Signed)
X-ray at bedside

## 2016-01-01 NOTE — Progress Notes (Signed)
The RN received a page back from Dr. Truman Hayward. Dr. Truman Hayward paged Dr. Delsa Sale. RN was given a pager number for Dr. Delsa Sale @ [phone].

## 2016-01-01 NOTE — ED Triage Notes (Addendum)
Per [facility] pt from home had hysterectomy on Tuesday bc of endometrial cancer, pain at site, episode of vomiting about 1 hour ago. According to pt red coloration noted in vomit. Pt also c/o general weakness. Most pain in right flank. Hx of HTN, pt only took half of normal dose. Decreased appetite and not been drinking much. Pts daughter reports she stayed one night in hospital, had difficulty urinating so a foley was inserted. Pt has not had a bowel movement since surgery.

## 2016-01-01 NOTE — Progress Notes (Signed)
Patient requested medication for indigestion. RN telephoned [facility] women's care @ [phone].The operator connected RN to telephone number: [phone]. The on call OB doctor was paged. Awaiting a return call.

## 2016-01-01 NOTE — Progress Notes (Signed)
RN received a call back from Dr. Delsa Sale and RN was given orders for patient.

## 2016-01-01 NOTE — ED Notes (Signed)
Bed: WA14 Expected date:  Expected time:  Means of arrival:  Comments: EMS 

## 2016-01-01 NOTE — Progress Notes (Signed)
Patient seen.  Stating she tolerated liquids with sherbet at lunch.  No nausea reported.  Ambulated in the halls without difficulty.  Discussed BMET results from 14:00.  Advised of plan for repeat labs in the am, removal of foley in the am.  Reporting mild gas pains.  No other concerns voiced.  Family at the bedside.

## 2016-01-01 NOTE — H&P (Signed)
H&P- Gynecologic Oncology  Shannon Scott 75 y.o. female  CC:  Chief Complaint  Patient presents with  . Emesis  . Post-op Problem    HPI: Shannon Scott is a 75 year old female initially seen in consultation at the request of Dr. Benjie Karvonen for a grade 1-2 endometrial cancer.  She reported having postmenopausal bleeding since June 2017.  She was seen by her PCP, who referred her to Dr. Benjie Karvonen, Gynecologist.  She underwent a transvaginal ultrasound on 11/21/15 resulting a uterus measuring 6x2.7x3.3cm with normal ovaries. There was a thickened endometrium of 6.45m.  She then underwent an endometrial biopsy on 11/21/15 resulting FIGO grade 1-2 endometrial cancer.  Pap was taken same day and was normal.  Medical clearance was obtained by Dr. PJeanann Lewandowskyprior to surgery due to her history of uncontrolled hypercholesterolemia and HTN.  History included CABG in 2005, tubal ligation, and 2 cesarean sections.  On December 30, 2015, she underwent a total robotic hysterectomy bilateral salpingo-oophorectomy, left pelvic, right para-aortic and bifurcation SLN removal by Dr. PNancy Marus  Her post-operative course inpatient was uneventful except for urinary retention, which a foley was placed and she was discharged home.   Interval History: EIshitha Roperpresented to the Emergency Room on 01/01/16 early am with an episode of emesis at home, diffuse abdominal pain (5 out of 10), lethargy, decreased PO intake.  She was admitted to telemetry for further monitoring, IV hydration, electrolyte repletion.  On admission, K+ of 2.9 and Na+ 124.      Review of Systems  Constitutional: Felt lethargic on admission per pt.  Dizziness and poor po intake also reported after discharge.  No fever.  Cardiovascular: No chest pain, shortness of breath, or edema.  Pulmonary: No cough or wheeze.  Gastrointestinal: +Nausea, emesis at home.  No diarrhea. No bright red blood per rectum or change in bowel movement.  Genitourinary: Foley  in place.  Adequate urine output reported.  No vaginal bleeding or discharge.  Musculoskeletal: No myalgia or joint pain. Neurologic: Generalized weakness after discharge.  No numbness or change in gait.  Psychology: No depression, anxiety, or insomnia.  Current Meds:  Current Facility-Administered Medications:  .  0.9 % NaCl with KCl 20 mEq/ L  infusion, , Intravenous, Continuous, Melissa D Cross, NP .  levothyroxine (SYNTHROID, LEVOTHROID) tablet 50 mcg, 50 mcg, Oral, QAC breakfast, Melissa D Cross, NP .  metoprolol succinate (TOPROL-XL) 24 hr tablet 50 mg, 50 mg, Oral, Daily, Melissa D Cross, NP .  ondansetron (ZOFRAN) injection 4 mg, 4 mg, Intravenous, Q8H PRN, Melissa D Cross, NP .  ondansetron (ZOFRAN) tablet 4 mg, 4 mg, Oral, Q8H PRN, Melissa D Cross, NP .  potassium chloride (K-DUR,KLOR-CON) CR tablet 10 mEq, 10 mEq, Oral, BID, Melissa D Cross, NP .  traMADol (ULTRAM) tablet 50-100 mg, 50-100 mg, Oral, Q6H PRN, MDorothyann Gibbs NP  Allergy:  Allergies  Allergen Reactions  . Statins     Myalgias and memory problems  . Ciprofloxacin Itching    Splotchy redness with itching during IV infusion localized to arm.    Social Hx:   Social History   Social History  . Marital status: Widowed    Spouse name: N/A  . Number of children: 2  . Years of education: N/A   Occupational History  .  Retired   Social History Main Topics  . Smoking status: Never Smoker  . Smokeless tobacco: Never Used  . Alcohol use No  . Drug use: No  .  Sexual activity: Not on file   Other Topics Concern  . Not on file   Social History Narrative  . No narrative on file    Past Surgical Hx:  Past Surgical History:  Procedure Laterality Date  . ABDOMINAL HYSTERECTOMY    . Carotid Doppler  02/2012   40-59% right int carotid artery stenosis; 60-79% L int carotid stenosis; L carotid bruit  . CORONARY ARTERY BYPASS GRAFT  03/12/2004   LIMA to LAD, SVG to circumflex, SVG to PDA  . ROBOTIC ASSISTED  TOTAL HYSTERECTOMY WITH BILATERAL SALPINGO OOPHERECTOMY Bilateral 12/30/2015   Procedure: XI ROBOTIC ASSISTED TOTAL HYSTERECTOMY WITH BILATERAL SALPINGO OOPHORECTOMY WITH SENTAL LYMPH NODE BIOPSY;  Surgeon: Nancy Marus, MD;  Location: [facility];  Service: Gynecology;  Laterality: Bilateral;  . TONSILLECTOMY    . TRANSTHORACIC ECHOCARDIOGRAM  04/2007   EF>55%; mild MR; mild-mod TR; mild pulm HTN; mild calcification of aortiv valve leaflets with mild valvular aortic stenosis  . TUBAL LIGATION      Past Medical Hx:  Past Medical History:  Diagnosis Date  . Arthritis   . Asthma    allergy induced  . Bilateral carotid artery disease (HCC)    L carotid bruit  . CAD (coronary artery disease)   . Cancer ([place])   . Constipation   . Dyslipidemia    intolerant to statins, welchol, niacin, zetia  . History of blood transfusion   . History of nuclear stress test 04/24/2012   lexiscan; normal study  . Hypertension   . Hypothyroidism     Family Hx:  Family History  Problem Relation Age of Onset  . Hypertension Mother   . Heart disease Mother   . Stroke Father   . Heart disease Father   . Kidney disease Brother   . Heart disease Brother     also HTN, hyperlipidemia  . Heart attack Brother   . Stroke Sister     x2  . Hypertension Sister   . Heart disease Sister     Vitals:  Blood pressure (!) 144/55, pulse 66, temperature 98.5 F (36.9 C), temperature source Oral, resp. rate 16, height 5' 1"  (1.549 m), weight 158 lb 1.1 oz (71.7 kg), SpO2 98 %.  Physical Exam 10 am assessment with Dr. Delsa Sale:  General: alert, cooperative and no distress Resp: clear to auscultation bilaterally Cardio: regular rate noted, mild systolic murmur noted, no clicks or rubs GI: soft, non-tender; bowel sounds normal; no masses,  no organomegaly Extremities: extremities normal, atraumatic, no cyanosis or edema Lap sites to the abdomen with steri strips, two clear tegaderms over two sites, no active  drainage or erythema, abdomen soft, mildly tympanic on percussion, active bowel sounds, non-tender on palpation  Assessment/Plan:   Advance diet as tolerated Bmet at 14:00 today and in the am Change IVF to NS with 20 meq KCL at 75 cc/hr. Remove foley in the am and begin voiding trial Plan for discharge in the am if diet tolerated, voiding, no nausea Encourage ambulation, IS use, deep breathing, and coughing Continue plan of care per Dr. Gregary Signs, MELISSA DEAL, NP 01/01/2016, 11:13 AM

## 2016-01-01 NOTE — ED Provider Notes (Signed)
[facility] DEPT Provider Note   CSN: [id number] Arrival date & time: 01/01/16  0459  First Provider Contact:  None       History   Chief Complaint Chief Complaint  Patient presents with  . Emesis  . Post-op Problem    HPI Shannon Scott is a 75 y.o. female.  HPI  This is a 75 year old female with history of endometrial cancer with recent radical hysterectomy and oophorectomy, coronary artery disease, aortic stenosis who presents with abdominal pain and vomiting. Patient had a hysterectomy 2 days ago. She was discharged at approximately 24 hours. She was discharged with an indwelling Foley. She states that at time of discharge she was tolerating fluids but had not eaten anything. She is passing gas but has not had a bowel movement. She reports one episode of emesis at home prior to arrival. She reports diffuse 5 out of 10 abdominal pain. She is not taking any pain medication at home. She denies any recent fevers. Reports that she feels dehydrated. Foley has been draining well.  Past Medical History:  Diagnosis Date  . Arthritis   . Asthma    allergy induced  . Bilateral carotid artery disease (HCC)    L carotid bruit  . CAD (coronary artery disease)   . Cancer ([place])   . Constipation   . Dyslipidemia    intolerant to statins, welchol, niacin, zetia  . History of blood transfusion   . History of nuclear stress test 04/24/2012   lexiscan; normal study  . Hypertension   . Hypothyroidism     Patient Active Problem List   Diagnosis Date Noted  . Hyponatremia 01/01/2016  . Endometrial cancer ([place]) 12/30/2015  . Familial hypercholesteremia 07/28/2015  . Aortic stenosis 08/02/2014  . S/P CABG x 3 01/13/2013  . Palpitations 01/13/2013  . Dyslipidemia 01/13/2013  . Medication intolerance 01/13/2013  . Chronic pain 01/13/2013  . TIA (transient ischemic attack) 01/13/2013  . Carotid stenosis 01/13/2013  . Hypothyroid 10/05/2010  . Hypertension 10/05/2010  .  Gastroesophagitis 10/05/2010  . Atrophic gastritis 10/05/2010    Past Surgical History:  Procedure Laterality Date  . ABDOMINAL HYSTERECTOMY    . Carotid Doppler  02/2012   40-59% right int carotid artery stenosis; 60-79% L int carotid stenosis; L carotid bruit  . CORONARY ARTERY BYPASS GRAFT  03/12/2004   LIMA to LAD, SVG to circumflex, SVG to PDA  . ROBOTIC ASSISTED TOTAL HYSTERECTOMY WITH BILATERAL SALPINGO OOPHERECTOMY Bilateral 12/30/2015   Procedure: XI ROBOTIC ASSISTED TOTAL HYSTERECTOMY WITH BILATERAL SALPINGO OOPHORECTOMY WITH SENTAL LYMPH NODE BIOPSY;  Surgeon: Shannon Marus, MD;  Location: [facility];  Service: Gynecology;  Laterality: Bilateral;  . TONSILLECTOMY    . TRANSTHORACIC ECHOCARDIOGRAM  04/2007   EF>55%; mild MR; mild-mod TR; mild pulm HTN; mild calcification of aortiv valve leaflets with mild valvular aortic stenosis  . TUBAL LIGATION      OB History    No data available       Home Medications    Prior to Admission medications   Medication Sig Start Date End Date Taking? Authorizing Provider  aspirin EC 81 MG tablet Take 81 mg by mouth daily.   Yes Historical Provider, MD  Azilsartan-Chlorthalidone (EDARBYCLOR) 40-25 MG TABS Take 1 tablet by mouth daily. nightly   Yes Historical Provider, MD  ibuprofen (ADVIL) 200 MG tablet Take 200 mg by mouth every 6 (six) hours as needed for pain. Reported on 12/17/2015   Yes Historical Provider, MD  levothyroxine (SYNTHROID, [place])  50 MCG tablet Take 50 mcg by mouth daily.    Yes Historical Provider, MD  loratadine (CLARITIN) 10 MG tablet Take 10 mg by mouth daily as needed for allergies. Reported on 12/17/2015   Yes Historical Provider, MD  metoprolol succinate (TOPROL-XL) 50 MG 24 hr tablet Take 50 mg by mouth daily. nightly 11/29/15  Yes Historical Provider, MD  traMADol (ULTRAM) 50 MG tablet Take 1-2 tablets (50-100 mg total) by mouth every 6 (six) hours as needed. Patient taking differently: Take 50-100 mg by mouth every  6 (six) hours as needed for moderate pain.  12/31/15  Yes Dorothyann Gibbs, NP    Family History Family History  Problem Relation Age of Onset  . Hypertension Mother   . Heart disease Mother   . Stroke Father   . Heart disease Father   . Kidney disease Brother   . Heart disease Brother     also HTN, hyperlipidemia  . Heart attack Brother   . Stroke Sister     x2  . Hypertension Sister   . Heart disease Sister     Social History Social History  Substance Use Topics  . Smoking status: Never Smoker  . Smokeless tobacco: Never Used  . Alcohol use No     Allergies   Statins and Ciprofloxacin   Review of Systems Review of Systems  Constitutional: Negative for fever.  Respiratory: Negative for shortness of breath.   Cardiovascular: Negative for chest pain.  Gastrointestinal: Positive for abdominal pain, nausea and vomiting. Negative for blood in stool and diarrhea.  Neurological: Positive for weakness.  All other systems reviewed and are negative.    Physical Exam Updated Vital Signs BP (!) 157/126   Pulse 71   Temp 97.7 F (36.5 C)   Resp 12   Ht 5' 1"  (1.549 m)   Wt 150 lb (68 kg)   SpO2 93%   BMI 28.34 kg/m   Physical Exam  Constitutional: She is oriented to person, place, and time. No distress.  Elderly  HENT:  Head: Normocephalic and atraumatic.  Mucous membranes dry  Cardiovascular: Normal rate and regular rhythm.   Murmur heard. Pulmonary/Chest: Effort normal and breath sounds normal. No respiratory distress. She has no wheezes.  Abdominal: Soft. Bowel sounds are normal. She exhibits no mass. There is no tenderness. There is no guarding.  Multiple laparoscopic incisions clean dry and intact, bruising noted adjacent incisions, largely nontender, soft, occasional crepitus noted at laparoscopic incision sites  Neurological: She is alert and oriented to person, place, and time.  Skin: Skin is warm and dry.  Psychiatric: She has a normal mood and affect.    Nursing note and vitals reviewed.    ED Treatments / Results  Labs (all labs ordered are listed, but only abnormal results are displayed) Labs Reviewed  CBC WITH DIFFERENTIAL/PLATELET - Abnormal; Notable for the following:       Result Value   WBC 10.7 (*)    Hemoglobin 11.3 (*)    HCT 32.3 (*)    Neutro Abs 9.0 (*)    All other components within normal limits  BASIC METABOLIC PANEL - Abnormal; Notable for the following:    Sodium 124 (*)    Potassium 2.9 (*)    Chloride 88 (*)    Glucose, Bld 109 (*)    Calcium 8.6 (*)    All other components within normal limits    EKG  EKG Interpretation None       Radiology  Dg Abdomen 1 View  Result Date: 01/01/2016 CLINICAL DATA:  75 year old female status post hysterectomy presenting with vomiting and abdominal pain EXAM: ABDOMEN - 1 VIEW COMPARISON:  Abdominal CT dated 01/27/2013 FINDINGS: There is extensive subcutaneous and soft tissue emphysema of the abdominal wall which limits evaluation for the intra-abdominal structures. There is air throughout the colon. No dilated small bowel or evidence of obstruction. There is opacification of the right upper abdomen which may represent an enlarged liver silhouette. There is degenerative changes of the spine. No acute fracture. IMPRESSION: No evidence of bowel obstruction. Extensive subcutaneous emphysema. Clinical correlation is recommended. Hepatomegaly. Electronically Signed   By: Anner Crete M.D.   On: 01/01/2016 06:18    Procedures Procedures (including critical care time)  Medications Ordered in ED Medications  potassium chloride 10 mEq in 100 mL IVPB (10 mEq Intravenous New Bag/Given 01/01/16 0655)  sodium chloride 0.9 % bolus 1,000 mL (0 mLs Intravenous Stopped 01/01/16 0655)  ondansetron (ZOFRAN) injection 4 mg (4 mg Intravenous Given 01/01/16 0559)  morphine 4 MG/ML injection 4 mg (4 mg Intravenous Given 01/01/16 0559)     Initial Impression / Assessment and Plan / ED Course   I have reviewed the triage vital signs and the nursing notes.  Pertinent labs & imaging results that were available during my care of the patient were reviewed by me and considered in my medical decision making (see chart for details).  Clinical Course    Patient presents with abdominal pain, vomiting, generalized weakness. Reports decreased by mouth intake. Medically she is nontoxic-appearing. She is appropriately tender to her abdomen. She does appear dry. She's given fluids and pain medication. Abdominal series with subcutaneous air which is appropriate given recent surgery. No evidence of significant ileus or obstruction. Basic lab work notable for hypokalemia and hyponatremia. Sodium dropped from 134 to 124 in 24 hours.  Maintenance fluids ordered. Discussed with Dr. Delsa Sale.  Will readmit to the gynecologic oncology service.  Final Clinical Impressions(s) / ED Diagnoses   Final diagnoses:  Post-op pain  Hyponatremia  Dehydration  Hypokalemia    New Prescriptions New Prescriptions   No medications on file     Merryl Hacker, MD 01/01/16 [phone]

## 2016-01-02 ENCOUNTER — Encounter (HOSPITAL_COMMUNITY): Payer: Self-pay | Admitting: Gynecologic Oncology

## 2016-01-02 ENCOUNTER — Telehealth: Payer: Self-pay | Admitting: Gynecologic Oncology

## 2016-01-02 DIAGNOSIS — R112 Nausea with vomiting, unspecified: Secondary | ICD-10-CM

## 2016-01-02 DIAGNOSIS — Z9889 Other specified postprocedural states: Secondary | ICD-10-CM

## 2016-01-02 HISTORY — DX: Other specified postprocedural states: R11.2

## 2016-01-02 HISTORY — DX: Other specified postprocedural states: Z98.890

## 2016-01-02 LAB — BASIC METABOLIC PANEL
Anion gap: 6 (ref 5–15)
BUN: 6 mg/dL (ref 6–20)
CO2: 28 mmol/L (ref 22–32)
Calcium: 8.4 mg/dL — ABNORMAL LOW (ref 8.9–10.3)
Chloride: 104 mmol/L (ref 101–111)
Creatinine, Ser: 0.74 mg/dL (ref 0.44–1.00)
GFR calc Af Amer: >60 mL/min (ref >60)
GFR calc non Af Amer: >60 mL/min (ref >60)
Glucose, Bld: 93 mg/dL (ref 65–99)
Potassium: 3.5 mmol/L (ref 3.5–5.1)
Sodium: 138 mmol/L (ref 135–145)

## 2016-01-02 NOTE — Discharge Instructions (Signed)
01/02/2016  Return to work: 4-6 weeks if applicable  Activity: 1. Be up and out of the bed during the day.  Take a nap if needed.  You may walk up steps but be careful and use the hand rail.  Stair climbing will tire you more than you think, you may need to stop part way and rest.   2. No lifting or straining for 6 weeks.  3. No driving for 1 week(s).  Do not drive if you are taking narcotic pain medicine.  4. Shower daily.  Use soap and water on your incision and pat dry; don't rub.  No tub baths until cleared by your surgeon.   5. No sexual activity and nothing in the vagina for 8 weeks.  6. You may experience a small amount of clear drainage from your incisions, which is normal.  If the drainage persists or increases, please call the office.   Diet: 1. Low sodium Heart Healthy Diet is recommended.  2. It is safe to use a laxative, such as Miralax or Colace, if you have difficulty moving your bowels.   Wound Care: 1. Keep clean and dry.  Shower daily.  Reasons to call the Doctor:  Fever - Oral temperature greater than 100.4 degrees Fahrenheit  Foul-smelling vaginal discharge  Difficulty urinating  Nausea and vomiting  Increased pain at the site of the incision that is unrelieved with pain medicine.  Difficulty breathing with or without chest pain  New calf pain especially if only on one side  Sudden, continuing increased vaginal bleeding with or without clots.   Contacts: For questions or concerns you should contact:  Dr. Everitt Amber at [phone]  [name], NP at [phone]  After Hours: call [phone] and have the GYN Oncologist paged/contacted  Tramadol tablets What is this medicine? TRAMADOL (TRA ma dole) is a pain reliever. It is used to treat moderate to severe pain in adults. This medicine may be used for other purposes; ask your health care provider or pharmacist if you have questions. What should I tell my health care provider before I take  this medicine? They need to know if you have any of these conditions: -brain tumor -depression -drug abuse or addiction -head injury -if you frequently drink alcohol containing drinks -kidney disease or trouble passing urine -liver disease -lung disease, asthma, or breathing problems -seizures or epilepsy -suicidal thoughts, plans, or attempt; a previous suicide attempt by you or a family member -an unusual or allergic reaction to tramadol, codeine, other medicines, foods, dyes, or preservatives -pregnant or trying to get pregnant -breast-feeding How should I use this medicine? Take this medicine by mouth with a full glass of water. Follow the directions on the prescription label. If the medicine upsets your stomach, take it with food or milk. Do not take more medicine than you are told to take. Talk to your pediatrician regarding the use of this medicine in children. Special care may be needed. Overdosage: If you think you have taken too much of this medicine contact a poison control center or emergency room at once. NOTE: This medicine is only for you. Do not share this medicine with others. What if I miss a dose? If you miss a dose, take it as soon as you can. If it is almost time for your next dose, take only that dose. Do not take double or extra doses. What may interact with this medicine? Do not take this medicine with any of the following medications: -MAOIs like  Carbex, Eldepryl, Marplan, Nardil, and Parnate This medicine may also interact with the following medications: -alcohol or medicines that contain alcohol -antihistamines -benzodiazepines -bupropion -carbamazepine or oxcarbazepine -clozapine -cyclobenzaprine -digoxin -furazolidone -linezolid -medicines for depression, anxiety, or psychotic disturbances -medicines for migraine headache like almotriptan, eletriptan, frovatriptan, naratriptan, rizatriptan, sumatriptan, zolmitriptan -medicines for pain like  pentazocine, buprenorphine, butorphanol, meperidine, nalbuphine, and propoxyphene -medicines for sleep -muscle relaxants -naltrexone -phenobarbital -phenothiazines like perphenazine, thioridazine, chlorpromazine, mesoridazine, fluphenazine, prochlorperazine, promazine, and trifluoperazine -procarbazine -warfarin This list may not describe all possible interactions. Give your health care provider a list of all the medicines, herbs, non-prescription drugs, or dietary supplements you use. Also tell them if you smoke, drink alcohol, or use illegal drugs. Some items may interact with your medicine. What should I watch for while using this medicine? Tell your doctor or health care professional if your pain does not go away, if it gets worse, or if you have new or a different type of pain. You may develop tolerance to the medicine. Tolerance means that you will need a higher dose of the medicine for pain relief. Tolerance is normal and is expected if you take this medicine for a long time. Do not suddenly stop taking your medicine because you may develop a severe reaction. Your body becomes used to the medicine. This does NOT mean you are addicted. Addiction is a behavior related to getting and using a drug for a non-medical reason. If you have pain, you have a medical reason to take pain medicine. Your doctor will tell you how much medicine to take. If your doctor wants you to stop the medicine, the dose will be slowly lowered over time to avoid any side effects. You may get drowsy or dizzy. Do not drive, use machinery, or do anything that needs mental alertness until you know how this medicine affects you. Do not stand or sit up quickly, especially if you are an older patient. This reduces the risk of dizzy or fainting spells. Alcohol can increase or decrease the effects of this medicine. Avoid alcoholic drinks. You may have constipation. Try to have a bowel movement at least every 2 to 3 days. If you do not  have a bowel movement for 3 days, call your doctor or health care professional. Your mouth may get dry. Chewing sugarless gum or sucking hard candy, and drinking plenty of water may help. Contact your doctor if the problem does not go away or is severe. What side effects may I notice from receiving this medicine? Side effects that you should report to your doctor or health care professional as soon as possible: -allergic reactions like skin rash, itching or hives, swelling of the face, lips, or tongue -breathing difficulties, wheezing -confusion -itching -light headedness or fainting spells -redness, blistering, peeling or loosening of the skin, including inside the mouth -seizures Side effects that usually do not require medical attention (report to your doctor or health care professional if they continue or are bothersome): -constipation -dizziness -drowsiness -headache -nausea, vomiting This list may not describe all possible side effects. Call your doctor for medical advice about side effects. You may report side effects to FDA at 1-800-FDA-1088. Where should I keep my medicine? Keep out of the reach of children. This medicine may cause accidental overdose and death if it taken by other adults, children, or pets. Mix any unused medicine with a substance like cat litter or coffee grounds. Then throw the medicine away in a sealed container like a sealed bag or  a coffee can with a lid. Do not use the medicine after the expiration date. Store at room temperature between 15 and 30 degrees C (59 and 86 degrees F). NOTE: This sheet is a summary. It may not cover all possible information. If you have questions about this medicine, talk to your doctor, pharmacist, or health care provider.    2016, Elsevier/Gold Standard. (2013-07-13 15:42:09)  Potassium Content of Foods Potassium is a mineral found in many foods and drinks. It helps keep fluids and minerals balanced in your body and affects  how steadily your heart beats. Potassium also helps control your blood pressure and keep your muscles and nervous system healthy. Certain health conditions and medicines may change the balance of potassium in your body. When this happens, you can help balance your level of potassium through the foods that you do or do not eat. Your health care provider or dietitian may recommend an amount of potassium that you should have each day. The following lists of foods provide the amount of potassium (in parentheses) per serving in each item. HIGH IN POTASSIUM  The following foods and beverages have 200 mg or more of potassium per serving:  Apricots, 2 raw or 5 dry (200 mg).  Artichoke, 1 medium (345 mg).  Avocado, raw,  each (245 mg).  Banana, 1 medium (425 mg).  Beans, lima, or baked beans, canned,  cup (280 mg).  Beans, white, canned,  cup (595 mg).  Beef roast, 3 oz (320 mg).  Beef, ground, 3 oz (270 mg).  Beets, raw or cooked,  cup (260 mg).  Bran muffin, 2 oz (300 mg).  Broccoli,  cup (230 mg).  Brussels sprouts,  cup (250 mg).  Cantaloupe,  cup (215 mg).  Cereal, 100% bran,  cup (200-400 mg).  Cheeseburger, single, fast food, 1 each (225-400 mg).  Chicken, 3 oz (220 mg).  Clams, canned, 3 oz (535 mg).  Crab, 3 oz (225 mg).  Dates, 5 each (270 mg).  Dried beans and peas,  cup (300-475 mg).  Figs, dried, 2 each (260 mg).  Fish: halibut, tuna, cod, snapper, 3 oz (480 mg).  Fish: salmon, haddock, swordfish, perch, 3 oz (300 mg).  Fish, tuna, canned 3 oz (200 mg).  [place] fries, fast food, 3 oz (470 mg).  Granola with fruit and nuts,  cup (200 mg).  Grapefruit juice,  cup (200 mg).  Greens, beet,  cup (655 mg).  Honeydew melon,  cup (200 mg).  Kale, raw, 1 cup (300 mg).  Kiwi, 1 medium (240 mg).  Kohlrabi, rutabaga, parsnips,  cup (280 mg).  Lentils,  cup (365 mg).  Mango, 1 each (325 mg).  Milk, chocolate, 1 cup (420 mg).  Milk:  nonfat, low-fat, whole, buttermilk, 1 cup (350-380 mg).  Molasses, 1 Tbsp (295 mg).  Mushrooms,  cup (280) mg.  Nectarine, 1 each (275 mg).  Nuts: almonds, peanuts, hazelnuts, [place], cashew, mixed, 1 oz (200 mg).  Nuts, pistachios, 1 oz (295 mg).  Orange, 1 each (240 mg).  Orange juice,  cup (235 mg).  Papaya, medium,  fruit (390 mg).  Peanut butter, chunky, 2 Tbsp (240 mg).  Peanut butter, smooth, 2 Tbsp (210 mg).  Pear, 1 medium (200 mg).  Pomegranate, 1 whole (400 mg).  Pomegranate juice,  cup (215 mg).  Pork, 3 oz (350 mg).  Potato chips, salted, 1 oz (465 mg).  Potato, baked with skin, 1 medium (925 mg).  Potatoes, boiled,  cup (255 mg).  Potatoes,  mashed,  cup (330 mg).  Prune juice,  cup (370 mg).  Prunes, 5 each (305 mg).  Pudding, chocolate,  cup (230 mg).  Pumpkin, canned,  cup (250 mg).  Raisins, seedless,  cup (270 mg).  Seeds, sunflower or pumpkin, 1 oz (240 mg).  Soy milk, 1 cup (300 mg).  Spinach,  cup (420 mg).  Spinach, canned,  cup (370 mg).  Sweet potato, baked with skin, 1 medium (450 mg).  Swiss chard,  cup (480 mg).  Tomato or vegetable juice,  cup (275 mg).  Tomato sauce or puree,  cup (400-550 mg).  Tomato, raw, 1 medium (290 mg).  Tomatoes, canned,  cup (200-300 mg).  [place], 3 oz (250 mg).  Wheat germ, 1 oz (250 mg).  Winter squash,  cup (250 mg).  Yogurt, plain or fruited, 6 oz (260-435 mg).  Zucchini,  cup (220 mg). MODERATE IN POTASSIUM The following foods and beverages have 50-200 mg of potassium per serving:  Apple, 1 each (150 mg).  Apple juice,  cup (150 mg).  Applesauce,  cup (90 mg).  Apricot nectar,  cup (140 mg).  Asparagus, small spears,  cup or 6 spears (155 mg).  Bagel, cinnamon raisin, 1 each (130 mg).  Bagel, egg or plain, 4 in., 1 each (70 mg).  Beans, green,  cup (90 mg).  Beans, yellow,  cup (190 mg).  Beer, regular, 12 oz (100 mg).  Beets, canned,   cup (125 mg).  Blackberries,  cup (115 mg).  Blueberries,  cup (60 mg).  Bread, whole wheat, 1 slice (70 mg).  Broccoli, raw,  cup (145 mg).  Cabbage,  cup (150 mg).  Carrots, cooked or raw,  cup (180 mg).  Cauliflower, raw,  cup (150 mg).  Celery, raw,  cup (155 mg).  Cereal, bran flakes, cup (120-150 mg).  Cheese, cottage,  cup (110 mg).  Cherries, 10 each (150 mg).  Chocolate, 1 oz bar (165 mg).  Coffee, brewed 6 oz (90 mg).  Corn,  cup or 1 ear (195 mg).  Cucumbers,  cup (80 mg).  Egg, large, 1 each (60 mg).  Eggplant,  cup (60 mg).  Endive, raw, cup (80 mg).  English muffin, 1 each (65 mg).  Fish, orange roughy, 3 oz (150 mg).  Frankfurter, beef or pork, 1 each (75 mg).  Fruit cocktail,  cup (115 mg).  Grape juice,  cup (170 mg).  Grapefruit,  fruit (175 mg).  Grapes,  cup (155 mg).  Greens: kale, turnip, collard,  cup (110-150 mg).  Ice cream or frozen yogurt, chocolate,  cup (175 mg).  Ice cream or frozen yogurt, vanilla,  cup (120-150 mg).  Lemons, limes, 1 each (80 mg).  Lettuce, all types, 1 cup (100 mg).  Mixed vegetables,  cup (150 mg).  Mushrooms, raw,  cup (110 mg).  Nuts: walnuts, pecans, or macadamia, 1 oz (125 mg).  Oatmeal,  cup (80 mg).  Okra,  cup (110 mg).  Onions, raw,  cup (120 mg).  Peach, 1 each (185 mg).  Peaches, canned,  cup (120 mg).  Pears, canned,  cup (120 mg).  Peas, green, frozen,  cup (90 mg).  Peppers, green,  cup (130 mg).  Peppers, red,  cup (160 mg).  Pineapple juice,  cup (165 mg).  Pineapple, fresh or canned,  cup (100 mg).  Plums, 1 each (105 mg).  Pudding, vanilla,  cup (150 mg).  Raspberries,  cup (90 mg).  Rhubarb,  cup (115 mg).  Rice,  wild,  cup (80 mg).  Shrimp, 3 oz (155 mg).  Spinach, raw, 1 cup (170 mg).  Strawberries,  cup (125 mg).  Summer squash  cup (175-200 mg).  Swiss chard, raw, 1 cup (135 mg).  Tangerines, 1  each (140 mg).  Tea, brewed, 6 oz (65 mg).  Turnips,  cup (140 mg).  Watermelon,  cup (85 mg).  Wine, red, table, 5 oz (180 mg).  Wine, white, table, 5 oz (100 mg). LOW IN POTASSIUM The following foods and beverages have less than 50 mg of potassium per serving.  Bread, white, 1 slice (30 mg).  Carbonated beverages, 12 oz (less than 5 mg).  Cheese, 1 oz (20-30 mg).  Cranberries,  cup (45 mg).  Cranberry juice cocktail,  cup (20 mg).  Fats and oils, 1 Tbsp (less than 5 mg).  Hummus, 1 Tbsp (32 mg).  Nectar: papaya, mango, or pear,  cup (35 mg).  Rice, white or brown,  cup (50 mg).  Spaghetti or macaroni,  cup cooked (30 mg).  Tortilla, flour or corn, 1 each (50 mg).  Waffle, 4 in., 1 each (50 mg).  Water chestnuts,  cup (40 mg).   This information is not intended to replace advice given to you by your health care provider. Make sure you discuss any questions you have with your health care provider.   Document Released: 12/29/2004 Document Revised: 05/22/2013 Document Reviewed: 04/13/2013 Elsevier Interactive Patient Education [employer].

## 2016-01-02 NOTE — Progress Notes (Signed)
Patient discharged home with family. Discharge paperwork explained to patient and family, both verbalized understanding. All belongings sent with patient. Patient A&O, VSS upon D/C. Denied pain & discomfort. Able to void adequate amount with no complications.

## 2016-01-02 NOTE — Telephone Encounter (Signed)
Patient informed of final path results and Dr. Elenora Gamma recommendations for no adjuvant therapy.  No concerns voiced.  Advised to call for any needs.

## 2016-01-02 NOTE — Discharge Summary (Signed)
Physician Discharge Summary  Patient ID: Shannon Scott MRN: [id number] DOB/AGE: 06-02-1940 75 y.o.  Admit date: 01/01/2016 Discharge date: 01/02/2016  Admission Diagnoses: Postoperative nausea and vomiting  Discharge Diagnoses:  Principal Problem:   Postoperative nausea and vomiting Active Problems:   Hyponatremia   Acute hypokalemia   Discharged Condition:  The patient is in good condition and stable for discharge.    Hospital Course: Patient was readmitted for post-operative nausea and vomiting, hyponatremia, and hypokalemia after having a total robotic hysterectomy bilateral salpingo-oophorectomy, left pelvic, right para-aortic and bifurcation SLN removal with Dr. Nancy Marus on 12/30/15.  Abdomen xray without evidence of bowel obstruction. Potassium replaced and hyponatremia and hypokalemia resolved at discharge.  Patient voiding since foley removal, tolerating diet, passing flatus, ambulating without difficulty.   Consults: None  Significant Diagnostic Studies: None  Treatments: IV hydration and potassium replacement  Discharge Exam: Blood pressure (!) 151/52, pulse 62, temperature 98.8 F (37.1 C), temperature source Oral, resp. rate 18, height 5\' 1"  (1.549 m), weight 158 lb 1.1 oz (71.7 kg), SpO2 90 %. General appearance: alert, cooperative and no distress Resp: clear to auscultation bilaterally Cardio: regular rate noted, mild systolic murmur noted GI: soft, non-tender; bowel sounds normal; no masses,  no organomegaly Extremities: extremities normal, atraumatic, no cyanosis or edema Incision/Wound: Lap sites with steri strips without active drainage or bleeding, no erythema, mild ecchymosis noted around the two incisions.  Disposition: 01-Home or Self Care  Discharge Instructions    Call MD for:  difficulty breathing, headache or visual disturbances    Complete by:  As directed   Call MD for:  extreme fatigue    Complete by:  As directed   Call MD for:  hives     Complete by:  As directed   Call MD for:  persistant dizziness or light-headedness    Complete by:  As directed   Call MD for:  persistant nausea and vomiting    Complete by:  As directed   Call MD for:  redness, tenderness, or signs of infection (pain, swelling, redness, odor or green/yellow discharge around incision site)    Complete by:  As directed   Call MD for:  severe uncontrolled pain    Complete by:  As directed   Call MD for:  temperature >100.4    Complete by:  As directed   Diet - low sodium heart healthy    Complete by:  As directed   Driving Restrictions    Complete by:  As directed   No driving for 1 week if you were cleared to drive before surgery.  Do not take narcotics and drive.   Increase activity slowly    Complete by:  As directed   Lifting restrictions    Complete by:  As directed   No lifting greater than 10 lbs.   Sexual Activity Restrictions    Complete by:  As directed   No sexual activity, nothing in the vagina, for 8 weeks.       Medication List    TAKE these medications   ADVIL 200 MG tablet Generic drug:  ibuprofen Take 200 mg by mouth every 6 (six) hours as needed for pain. Reported on 12/17/2015   aspirin EC 81 MG tablet Take 81 mg by mouth daily.   EDARBYCLOR 40-25 MG Tabs Generic drug:  Azilsartan-Chlorthalidone Take 1 tablet by mouth daily. nightly   levothyroxine 50 MCG tablet Commonly known as:  SYNTHROID, LEVOTHROID Take 50 mcg by mouth daily.  loratadine 10 MG tablet Commonly known as:  CLARITIN Take 10 mg by mouth daily as needed for allergies. Reported on 12/17/2015   metoprolol succinate 50 MG 24 hr tablet Commonly known as:  TOPROL-XL Take 50 mg by mouth daily. nightly   traMADol 50 MG tablet Commonly known as:  ULTRAM Take 1-2 tablets (50-100 mg total) by mouth every 6 (six) hours as needed. What changed:  reasons to take this      Follow-up Information    GEHRIG,PAOLA A., MD Follow up on 01/28/2016.   Specialty:   Gynecologic Oncology Why:  at 10:45am at the [facility] information: [address] [place] [id number] [phone]           Greater than thirty minutes were spend for face to face discharge instructions and discharge orders/summary in EPIC.   Signed: Vaida Kerchner DEAL 01/02/2016, 11:01 AM

## 2016-01-06 ENCOUNTER — Telehealth: Payer: Self-pay

## 2016-01-06 NOTE — Telephone Encounter (Signed)
Follow up call placed to see if the patient had a BM , patient states she had a BM after taking an enema yesterday per Dr Anson Crofts recommendations. Patient states she is eating and drinking now and denies pain. Patient educated about the importance of having a BM every 1.5 - 2 days . Writer also stated that this was discussed during her discharge from the hospital on August 02,2017 , family was present. Patient states she did not remember , but understands the importance of having a BM every 1.5 - 2 days. Patient encouraged to call with any additional changes , questions or concerns, patient agreed. Melissa Cross, APNP updated with follow up call and the patient having a BM today.

## 2016-01-07 ENCOUNTER — Telehealth: Payer: Self-pay

## 2016-01-07 NOTE — Telephone Encounter (Signed)
Incoming call , patient states she has been sweating frequently for the past few days and was wondering if this a n after effect of her surgery. Patient also states she been experiencing intermittent pain below her incision sites as well as some mild constipation. Patient denies fever and states she has assessed her temperature intermittently and states no elevated temperature. Patient also states she has been pretty actively lately and thinks that may be contributing to her pain. Writer suggested that she decrease her activity level and monitor the pain and sweating to see if their may a correlation with the pain and increased activity.Melissa Cross, APNP updated , orders received to instruct the patient to monitor her temperature and decrease her activity and we re-evaluate tomorrow . Patient states understanding , denies further questions at this time .

## 2016-01-08 ENCOUNTER — Telehealth: Payer: Self-pay

## 2016-01-08 NOTE — Telephone Encounter (Addendum)
Patient returned call , patient states that she continues to sweat to point that her clothing and hair are "wet" about 1-2 times a day. Patient also states she has been up urinating several times during night last night with "pelvic pressure". Patient denies ,fever and or burning with urination,states urine is light yellow in color.Patient states she is drinking a lot of fluids like cranberry and apple juice. Patient also states she is taking Ibuprofen for the pain and it is helpful. Melissa Cross, APNP updated , orders received have the monitor the pelvic pressure and we will follow up tomorrow and if the pressure is not better, we will move her MD follow up to next week. Patient states understanding and agreed to the plan.

## 2016-01-08 NOTE — Telephone Encounter (Signed)
Follow up call placed to see how the patient was doing , no answer , left a detailed message with call back infromtion provided.

## 2016-01-09 NOTE — Telephone Encounter (Signed)
Incoming call , patient states she felt faint this morning and feels a little shaky . Patient states she did not take her Metoprolol last night because her B/P was 100/62 . Patient states she took her Edarbyclor , patient also states that the pelvic pain and pressure has passed. Patient states she is having BM and her appetite is "OK". Patient instructed to contact Dr Jeanann Lewandowsky to update with new onset of feeling faint. Patient states understanding ,agreed with plan, Melissa Cross, APNP updated.

## 2016-01-12 ENCOUNTER — Other Ambulatory Visit: Payer: Self-pay | Admitting: Internal Medicine

## 2016-01-12 DIAGNOSIS — R1032 Left lower quadrant pain: Secondary | ICD-10-CM | POA: Diagnosis not present

## 2016-01-12 DIAGNOSIS — I10 Essential (primary) hypertension: Secondary | ICD-10-CM | POA: Diagnosis not present

## 2016-01-12 DIAGNOSIS — I2510 Atherosclerotic heart disease of native coronary artery without angina pectoris: Secondary | ICD-10-CM | POA: Diagnosis not present

## 2016-01-12 DIAGNOSIS — R10812 Left upper quadrant abdominal tenderness: Secondary | ICD-10-CM

## 2016-01-12 DIAGNOSIS — E039 Hypothyroidism, unspecified: Secondary | ICD-10-CM | POA: Diagnosis not present

## 2016-01-14 ENCOUNTER — Ambulatory Visit (HOSPITAL_COMMUNITY)
Admission: RE | Admit: 2016-01-14 | Discharge: 2016-01-14 | Disposition: A | Discharge status: Home / Self Care | Payer: Medicare Other | Source: Ambulatory Visit | Attending: Internal Medicine | Admitting: Internal Medicine

## 2016-01-14 ENCOUNTER — Encounter (HOSPITAL_COMMUNITY): Payer: Self-pay

## 2016-01-14 DIAGNOSIS — R188 Other ascites: Secondary | ICD-10-CM | POA: Diagnosis not present

## 2016-01-14 DIAGNOSIS — R1032 Left lower quadrant pain: Secondary | ICD-10-CM | POA: Diagnosis not present

## 2016-01-14 DIAGNOSIS — X58XXXA Exposure to other specified factors, initial encounter: Secondary | ICD-10-CM | POA: Diagnosis not present

## 2016-01-14 DIAGNOSIS — T797XXA Traumatic subcutaneous emphysema, initial encounter: Secondary | ICD-10-CM | POA: Diagnosis not present

## 2016-01-14 DIAGNOSIS — Z9889 Other specified postprocedural states: Secondary | ICD-10-CM | POA: Insufficient documentation

## 2016-01-14 DIAGNOSIS — R1012 Left upper quadrant pain: Secondary | ICD-10-CM | POA: Diagnosis not present

## 2016-01-14 DIAGNOSIS — R10812 Left upper quadrant abdominal tenderness: Secondary | ICD-10-CM

## 2016-01-14 MED ORDER — IOPAMIDOL (ISOVUE-300) INJECTION 61%
100.0000 mL | Freq: Once | INTRAVENOUS | Status: AC | PRN
  Administered 2016-01-14: 100 mL via INTRAVENOUS

## 2016-01-28 ENCOUNTER — Ambulatory Visit: Payer: Medicare Other | Attending: Gynecologic Oncology | Admitting: Gynecologic Oncology

## 2016-01-28 ENCOUNTER — Encounter: Payer: Self-pay | Admitting: Gynecologic Oncology

## 2016-01-28 VITALS — BP 177/65 | HR 77 | Temp 97.7°F | Resp 18 | Ht 61.0 in | Wt 144.1 lb

## 2016-01-28 DIAGNOSIS — Z881 Allergy status to other antibiotic agents status: Secondary | ICD-10-CM | POA: Diagnosis not present

## 2016-01-28 DIAGNOSIS — I10 Essential (primary) hypertension: Secondary | ICD-10-CM | POA: Insufficient documentation

## 2016-01-28 DIAGNOSIS — Z841 Family history of disorders of kidney and ureter: Secondary | ICD-10-CM | POA: Diagnosis not present

## 2016-01-28 DIAGNOSIS — I6523 Occlusion and stenosis of bilateral carotid arteries: Secondary | ICD-10-CM | POA: Diagnosis not present

## 2016-01-28 DIAGNOSIS — Z888 Allergy status to other drugs, medicaments and biological substances status: Secondary | ICD-10-CM | POA: Insufficient documentation

## 2016-01-28 DIAGNOSIS — Z951 Presence of aortocoronary bypass graft: Secondary | ICD-10-CM | POA: Diagnosis not present

## 2016-01-28 DIAGNOSIS — I2510 Atherosclerotic heart disease of native coronary artery without angina pectoris: Secondary | ICD-10-CM | POA: Insufficient documentation

## 2016-01-28 DIAGNOSIS — Z90722 Acquired absence of ovaries, bilateral: Secondary | ICD-10-CM | POA: Insufficient documentation

## 2016-01-28 DIAGNOSIS — M1990 Unspecified osteoarthritis, unspecified site: Secondary | ICD-10-CM | POA: Diagnosis not present

## 2016-01-28 DIAGNOSIS — E785 Hyperlipidemia, unspecified: Secondary | ICD-10-CM | POA: Diagnosis not present

## 2016-01-28 DIAGNOSIS — C541 Malignant neoplasm of endometrium: Secondary | ICD-10-CM | POA: Diagnosis not present

## 2016-01-28 DIAGNOSIS — Z8249 Family history of ischemic heart disease and other diseases of the circulatory system: Secondary | ICD-10-CM | POA: Insufficient documentation

## 2016-01-28 DIAGNOSIS — Z7982 Long term (current) use of aspirin: Secondary | ICD-10-CM | POA: Insufficient documentation

## 2016-01-28 DIAGNOSIS — E039 Hypothyroidism, unspecified: Secondary | ICD-10-CM | POA: Diagnosis not present

## 2016-01-28 DIAGNOSIS — Z90710 Acquired absence of both cervix and uterus: Secondary | ICD-10-CM | POA: Insufficient documentation

## 2016-01-28 DIAGNOSIS — Z823 Family history of stroke: Secondary | ICD-10-CM | POA: Diagnosis not present

## 2016-01-28 NOTE — Patient Instructions (Addendum)
Returned to GYN oncology in 6 months.  Please call us at [phone] in October or November to schedule your appointment for the end of Feb, early March.

## 2016-01-28 NOTE — Progress Notes (Signed)
Consult Note: Gyn-Onc  Consult was requested by Dr. Benjie Karvonen for the evaluation of Shannon Scott 75 y.o. female  CC:  Chief Complaint  Patient presents with  . Endometrial Cancer    Follow up    Assessment/Plan:  [name]  is a 75 y.o.  year old with Stage IA grade 2 endometrioid adenocarcinoma. She only had 30% depth of myometrial invasion, no lymphovascular space involvement. Therefore, she does not qualify for adjuvant radiation therapy under GOG 99. She can be dispositioned to close follow-up. She return to see [place] in 6 months.  We appreciate the opportunity to partner in the care of this very pleasant patient.  HPI: Shannon Scott is a 75 year old G2P2 who was seen in consultation at the request of Dr Benjie Karvonen for grade 1-2 endometrial cancer. The patient began noticing postmenopausal bleeding in June, 2017. She saw her primary care physician who determined there was no blood in the urine, and she was then referred to Dr Benjie Karvonen who, on 11/21/15 performed a TVUS which showed a uterus measuring 6x2.7x3.3cm with normal ovaries. There was a thickened endometrium of 6.19m. A pipelle endometrial biopsy was performed on 11/21/15 which showed FIGO grade 1-2 endometrial cancer.  The pap from the same date was normal.  The patient has a history of familial hypercholesterolemia. She has a history of a 3 vessel CABG in 2005. She has never had a CVA, and recent carotid dopplers show 50% occlusion. She reports symptoms concerning for LE claudication with ambulation and stairs. She is not a smoker.  Interval History: ENCOUNTER DATE: 12/30/2015 Preop Diagnosis: Grade 1-2 endometrioid adenocarcinoma.  Postoperative Diagnosis: same.  Surgery: Total robotic hysterectomy bilateral salpingo-oophorectomy, left pelvic, right para-aortic and bifurcation SLN removal  Operative findings:  1) Colon adherent to posterior uterus, left sidewall and appendix 2) 2 right para-aortic nodes for SLN (high and PA  node) 3) Aortic bifurcation nodes 4) Left obturator node  Diagnosis 1. Lymph node, sentinel, biopsy, right obturator - ONE BENIGN LYMPH NODE (0/1). 2. Lymph node, sentinel, biopsy, right peri-aortic - ONE BENIGN LYMPH NODE (0/1). 3. Lymph node, sentinel, biopsy, right lower peri-aortic - ONE BENIGN LYMPH NODE (0/1). 4. Lymph node, sentinel, biopsy, aortic bifurcation - ONE BENIGN LYMPH NODE (0/1). 5. Lymph node, sentinel, biopsy, left obturator - ONE BENIGN LYMPH NODE (0/1). 6. Uterus +/- tubes/ovaries, neoplastic - ENDOMETRIAL ADENOCARCINOMA, 1.7 CM WITH SUPERFICIAL MYOMETRIAL INVASION. - MARGINS NOT INVOLVED. - CERVIX, BILATERAL OVARIES AND BILATERAL FALLOPIAN TUBES FREE OF TUMOR. Microscopic Comment 6. ONCOLOGY TABLE-UTERUS, CARCINOMA OR CARCINOSARCOMA Specimen: Uterus with bilateral fallopian tubes and ovaries and sentinel lymph node biopsies. Procedure: Hysterectomy with sentinel lymph nodes. Lymph node sampling performed: Yes. Specimen integrity: Intact. Maximum tumor size: 1.7 cm Histologic type: Endometrioid with squamous differentiation. Grade: 2 Myometrial invasion: 0.3 cm where myometrium is 1 cm in thickness Cervical stromal involvement: No. Extent of involvement of other organs: None, identified. Lymph - vascular invasion: Not identified.  She was admitted postoperatively for postoperative nausea and vomiting, hyponatremia and acute hypokalemia. She was admitted on August 3 and discharge on August 4 after IV fluid hydration and potassium replacement. She states she also had a urinary tract infection and was managed by Augmentin. She comes in today for follow-up. She states that she feels about 60% of herself. She's eating well there are appetite is not as good as it was before surgery she believes her weight is stable. She did have some constipation after the surgery and is  now having no issues with Colace but she did use an enema once. She's had no vaginal bleeding. She  denies any fevers or pain. She denies any dysuria or bladder symptoms today.   Current Meds:  Outpatient Encounter Prescriptions as of 01/28/2016  Medication Sig  . aspirin EC 81 MG tablet Take 81 mg by mouth daily.  . Azilsartan-Chlorthalidone (EDARBYCLOR) 40-25 MG TABS Take 1 tablet by mouth daily. nightly  . ibuprofen (ADVIL) 200 MG tablet Take 200 mg by mouth every 6 (six) hours as needed for pain. Reported on 12/17/2015  . levothyroxine (SYNTHROID, LEVOTHROID) 50 MCG tablet Take 50 mcg by mouth daily.   Marland Kitchen loratadine (CLARITIN) 10 MG tablet Take 10 mg by mouth daily as needed for allergies. Reported on 12/17/2015  . [DISCONTINUED] metoprolol succinate (TOPROL-XL) 50 MG 24 hr tablet Take 50 mg by mouth daily. nightly  . [DISCONTINUED] traMADol (ULTRAM) 50 MG tablet Take 1-2 tablets (50-100 mg total) by mouth every 6 (six) hours as needed. (Patient taking differently: Take 50-100 mg by mouth every 6 (six) hours as needed for moderate pain. )   No facility-administered encounter medications on file as of 01/28/2016.     Allergy:  Allergies  Allergen Reactions  . Statins     Myalgias and memory problems  . Ciprofloxacin Itching    Splotchy redness with itching during IV infusion localized to arm.    Social Hx:   Social History   Social History  . Marital status: Widowed    Spouse name: N/A  . Number of children: 2  . Years of education: N/A   Occupational History  .  Retired   Social History Main Topics  . Smoking status: Never Smoker  . Smokeless tobacco: Never Used  . Alcohol use No  . Drug use: No  . Sexual activity: Not on file   Other Topics Concern  . Not on file   Social History Narrative  . No narrative on file    Past Surgical Hx:  Past Surgical History:  Procedure Laterality Date  . ABDOMINAL HYSTERECTOMY    . Carotid Doppler  02/2012   40-59% right int carotid artery stenosis; 60-79% L int carotid stenosis; L carotid bruit  . CORONARY ARTERY BYPASS  GRAFT  03/12/2004   LIMA to LAD, SVG to circumflex, SVG to PDA  . ROBOTIC ASSISTED TOTAL HYSTERECTOMY WITH BILATERAL SALPINGO OOPHERECTOMY Bilateral 12/30/2015   Procedure: XI ROBOTIC ASSISTED TOTAL HYSTERECTOMY WITH BILATERAL SALPINGO OOPHORECTOMY WITH SENTAL LYMPH NODE BIOPSY;  Surgeon: Nancy Marus, MD;  Location: [facility];  Service: Gynecology;  Laterality: Bilateral;  . TONSILLECTOMY    . TRANSTHORACIC ECHOCARDIOGRAM  04/2007   EF>55%; mild MR; mild-mod TR; mild pulm HTN; mild calcification of aortiv valve leaflets with mild valvular aortic stenosis  . TUBAL LIGATION      Past Medical Hx:  Past Medical History:  Diagnosis Date  . Arthritis   . Asthma    allergy induced  . Bilateral carotid artery disease (HCC)    L carotid bruit  . CAD (coronary artery disease)   . Cancer ([place])   . Constipation   . Dyslipidemia    intolerant to statins, welchol, niacin, zetia  . History of blood transfusion   . History of nuclear stress test 04/24/2012   lexiscan; normal study  . Hypertension   . Hypothyroidism   . Postoperative nausea and vomiting 01/02/2016    Past Gynecological History:  See HPI  No LMP recorded. Patient has had  a hysterectomy.  Family Hx:  Family History  Problem Relation Age of Onset  . Hypertension Mother   . Heart disease Mother   . Stroke Father   . Heart disease Father   . Kidney disease Brother   . Heart disease Brother     also HTN, hyperlipidemia  . Heart attack Brother   . Stroke Sister     x2  . Hypertension Sister   . Heart disease Sister     Vitals:  Blood pressure (!) 177/65, pulse 77, temperature 97.7 F (36.5 C), temperature source Oral, resp. rate 18, height 5' 1"  (1.549 m), weight 144 lb 1.6 oz (65.4 kg), SpO2 100 %. BMI 28kg/m2  Physical Exam: WD in NAD Abdomen: Well-healing surgical incisions. Abdomen is soft, nontender, nondistended. There is no evidence of an incisional hernias. Genito Urinary: External genitalia within normal limits.  Vagina is markedly atrophic. The vaginal cuff is visualized. The suture line is intact. Bimanual examination reveals no fluctuance or masses. There is no tenderness. The vaginal cuff is intact. Extremities: No edema   Xylah Early A., MD  01/28/2016, 11:16 AM

## 2016-03-03 DIAGNOSIS — N390 Urinary tract infection, site not specified: Secondary | ICD-10-CM | POA: Diagnosis not present

## 2016-03-03 DIAGNOSIS — I2510 Atherosclerotic heart disease of native coronary artery without angina pectoris: Secondary | ICD-10-CM | POA: Diagnosis not present

## 2016-03-03 DIAGNOSIS — N342 Other urethritis: Secondary | ICD-10-CM | POA: Diagnosis not present

## 2016-03-03 DIAGNOSIS — E7800 Pure hypercholesterolemia, unspecified: Secondary | ICD-10-CM | POA: Diagnosis not present

## 2016-03-03 DIAGNOSIS — I10 Essential (primary) hypertension: Secondary | ICD-10-CM | POA: Diagnosis not present

## 2016-03-03 DIAGNOSIS — E039 Hypothyroidism, unspecified: Secondary | ICD-10-CM | POA: Diagnosis not present

## 2016-03-12 ENCOUNTER — Telehealth: Payer: Self-pay | Admitting: Gynecologic Oncology

## 2016-03-12 NOTE — Telephone Encounter (Signed)
Patient called reporting a small amount of light pink vaginal spotting.  She also states she feels her abdomen and her bladder are connected because she feels mild abd discomfort when urinating and sometimes has a feeling that something is falling out.  No dysuria or hematuria reported.  She states she had a UA checked at her PCP's office that was negative.  Patient stating she wants to come see [place] for evaluation vs Dr. Gardiner Coins office.  Appt made for Oct 18 but patient advised to please call the office if symptoms worsen or for any new symptoms.

## 2016-03-16 NOTE — Progress Notes (Signed)
Consult Note: Gyn-Onc  Consult was requested by Dr. Benjie Karvonen for the evaluation of Shannon Scott 75 y.o. female  CC:  Chief Complaint  Patient presents with  . Endometrial cancer    Follow up visit    Assessment/Plan:  Shannon Scott  is a 75 y.o.  year old with Stage IA grade 2 endometrioid adenocarcinoma. She only had 30% depth of myometrial invasion, no lymphovascular space involvement, and low risk factors for recurrence therefore adjuvant therapy not recommended in accordance to NCCN guidelines.    She has very subtle mucosal thinning/separation in the midline of the cuff which I think is contributing to her spotting symptoms, but does not constitute cuff dehiscence.  I do not think it should prevent her from being able to participate in her cruise tomorrow.  I counseled for her to avoid heavy straining and to avoid placing anything per vagina.  She will return to see Dr Alycia Rossetti in early November upon her return to evaluate for healing. Premarin could be considered at that time pending its status.  HPI: Shannon Scott is a 75 year old G2P2 who was seen in consultation at the request of Dr Benjie Karvonen for grade 1-2 endometrial cancer. The patient began noticing postmenopausal bleeding in June, 2017. She saw her primary care physician who determined there was no blood in the urine, and she was then referred to Dr Benjie Karvonen who, on 11/21/15 performed a TVUS which showed a uterus measuring 6x2.7x3.3cm with normal ovaries. There was a thickened endometrium of 6.30m. A pipelle endometrial biopsy was performed on 11/21/15 which showed FIGO grade 1-2 endometrial cancer.  The pap from the same date was normal.  The patient has a history of familial hypercholesterolemia. She has a history of a 3 vessel CABG in 2005. She has never had a CVA, and recent carotid dopplers show 50% occlusion. She reports symptoms concerning for LE claudication with ambulation and stairs. She is not a smoker.  On 12/30/15 she  underwent robotic assisted total hysterectomy, BSO, pelvic and PA SLN biopsy. 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  Postoperative Diagnosis: same.  Surgery: Total robotic hysterectomy bilateral salpingo-oophorectomy, left pelvic, right para-aortic and bifurcation SLN removal  Operative findings:  1) Colon adherent to posterior uterus, left sidewall and appendix 2) 2 right para-aortic nodes for SLN (high and PA node) 3) Aortic bifurcation nodes 4) Left obturator node  Diagnosis 1. Lymph node, sentinel, biopsy, right obturator - ONE BENIGN LYMPH NODE (0/1). 2. Lymph node, sentinel, biopsy, right peri-aortic - ONE BENIGN LYMPH NODE (0/1). 3. Lymph node, sentinel, biopsy, right lower peri-aortic - ONE BENIGN LYMPH NODE (0/1). 4. Lymph node, sentinel, biopsy, aortic bifurcation - ONE BENIGN LYMPH NODE (0/1). 5. Lymph node, sentinel, biopsy, left obturator - ONE BENIGN LYMPH NODE (0/1). 6. Uterus +/- tubes/ovaries, neoplastic - ENDOMETRIAL ADENOCARCINOMA, 1.7 CM WITH SUPERFICIAL MYOMETRIAL INVASION. - MARGINS NOT INVOLVED. - CERVIX, BILATERAL OVARIES AND BILATERAL FALLOPIAN TUBES FREE OF TUMOR. Microscopic Comment 6. ONCOLOGY TABLE-UTERUS, CARCINOMA OR CARCINOSARCOMA Specimen: Uterus with bilateral fallopian tubes and ovaries and sentinel lymph node biopsies. Procedure: Hysterectomy with sentinel lymph nodes. Lymph node sampling performed: Yes. Specimen integrity: Intact. Maximum tumor size: 1.7 cm Histologic type: Endometrioid with squamous differentiation. Grade: 2 Myometrial invasion: 0.3 cm where myometrium is 1 cm in thickness Cervical stromal involvement: No. Extent of involvement of other organs: None, identified. Lymph - vascular invasion: Not identified.  Interval History: She had been doing very well postop until 1 week  ago when she began noticing increased discomfort suprapubically when there is a full bladder or during BM. She  also has been experiencing some light spotting.   Current Meds:  Outpatient Encounter Prescriptions as of 03/17/2016  Medication Sig  . aspirin EC 81 MG tablet Take 81 mg by mouth daily.  Marland Kitchen docusate sodium (COLACE) 100 MG capsule Take 100 mg by mouth daily.  Marland Kitchen ibuprofen (ADVIL) 200 MG tablet Take 200 mg by mouth every 6 (six) hours as needed for pain. Reported on 12/17/2015  . levothyroxine (SYNTHROID, LEVOTHROID) 50 MCG tablet Take 50 mcg by mouth daily.   . metoprolol succinate (TOPROL-XL) 50 MG 24 hr tablet Take 50 mg by mouth daily.  . irbesartan-hydrochlorothiazide (AVALIDE) 150-12.5 MG tablet   . loratadine (CLARITIN) 10 MG tablet Take 10 mg by mouth daily as needed for allergies. Reported on 12/17/2015  . traMADol (ULTRAM) 50 MG tablet   . [DISCONTINUED] Azilsartan-Chlorthalidone (EDARBYCLOR) 40-25 MG TABS Take 1 tablet by mouth daily. nightly   No facility-administered encounter medications on file as of 03/17/2016.     Allergy:  Allergies  Allergen Reactions  . Statins     Myalgias and memory problems  . Ciprofloxacin Itching    Splotchy redness with itching during IV infusion localized to arm.    Social Hx:   Social History   Social History  . Marital status: Widowed    Spouse name: N/A  . Number of children: 2  . Years of education: N/A   Occupational History  .  Retired   Social History Main Topics  . Smoking status: Never Smoker  . Smokeless tobacco: Never Used  . Alcohol use No  . Drug use: No  . Sexual activity: Not on file   Other Topics Concern  . Not on file   Social History Narrative  . No narrative on file    Past Surgical Hx:  Past Surgical History:  Procedure Laterality Date  . ABDOMINAL HYSTERECTOMY    . Carotid Doppler  02/2012   40-59% right int carotid artery stenosis; 60-79% L int carotid stenosis; L carotid bruit  . CORONARY ARTERY BYPASS GRAFT  03/12/2004   LIMA to LAD, SVG to circumflex, SVG to PDA  . ROBOTIC ASSISTED TOTAL  HYSTERECTOMY WITH BILATERAL SALPINGO OOPHERECTOMY Bilateral 12/30/2015   Procedure: XI ROBOTIC ASSISTED TOTAL HYSTERECTOMY WITH BILATERAL SALPINGO OOPHORECTOMY WITH SENTAL LYMPH NODE BIOPSY;  Surgeon: Nancy Marus, MD;  Location: [facility];  Service: Gynecology;  Laterality: Bilateral;  . TONSILLECTOMY    . TRANSTHORACIC ECHOCARDIOGRAM  04/2007   EF>55%; mild MR; mild-mod TR; mild pulm HTN; mild calcification of aortiv valve leaflets with mild valvular aortic stenosis  . TUBAL LIGATION      Past Medical Hx:  Past Medical History:  Diagnosis Date  . Arthritis   . Asthma    allergy induced  . Bilateral carotid artery disease (HCC)    L carotid bruit  . CAD (coronary artery disease)   . Cancer ([place])   . Constipation   . Dyslipidemia    intolerant to statins, welchol, niacin, zetia  . History of blood transfusion   . History of nuclear stress test 04/24/2012   lexiscan; normal study  . Hypertension   . Hypothyroidism   . Postoperative nausea and vomiting 01/02/2016    Past Gynecological History:  See HPI  No LMP recorded. Patient has had a hysterectomy.  Family Hx:  Family History  Problem Relation Age of Onset  . Hypertension Mother   .  Heart disease Mother   . Stroke Father   . Heart disease Father   . Kidney disease Brother   . Heart disease Brother     also HTN, hyperlipidemia  . Heart attack Brother   . Stroke Sister     x2  . Hypertension Sister   . Heart disease Sister     Vitals:  Blood pressure (!) 200/80, pulse (!) 55, temperature 97.8 F (36.6 C), temperature source Oral, resp. rate 17, weight 146 lb 12.8 oz (66.6 kg), SpO2 100 %. BMI 28kg/m2  Physical Exam: WD in NAD Abdomen: Well-healing surgical incisions. Abdomen is soft, nontender, nondistended. There is no evidence of an incisional hernias. Genito Urinary: External genitalia within normal limits. Vagina is markedly atrophic. The vaginal cuff is visualized. The suture line is intact. Bimanual examination  reveals no fluctuance or masses. There is no tenderness. The vaginal cuff is grossly intact however there is thinning at the midline consistent with mucosal separation. Visibly there is scant old blood in the vault but no active bleeding and no visible defects. Extremities: No edema   Donaciano Eva, MD  03/18/2016, 5:17 PM

## 2016-03-17 ENCOUNTER — Encounter: Payer: Self-pay | Admitting: Gynecologic Oncology

## 2016-03-17 ENCOUNTER — Ambulatory Visit: Payer: Medicare Other | Attending: Gynecologic Oncology | Admitting: Gynecologic Oncology

## 2016-03-17 VITALS — BP 200/80 | HR 55 | Temp 97.8°F | Resp 17 | Wt 146.8 lb

## 2016-03-17 DIAGNOSIS — Z823 Family history of stroke: Secondary | ICD-10-CM | POA: Insufficient documentation

## 2016-03-17 DIAGNOSIS — N898 Other specified noninflammatory disorders of vagina: Secondary | ICD-10-CM

## 2016-03-17 DIAGNOSIS — I10 Essential (primary) hypertension: Secondary | ICD-10-CM | POA: Insufficient documentation

## 2016-03-17 DIAGNOSIS — C541 Malignant neoplasm of endometrium: Secondary | ICD-10-CM | POA: Diagnosis not present

## 2016-03-17 DIAGNOSIS — Z8249 Family history of ischemic heart disease and other diseases of the circulatory system: Secondary | ICD-10-CM | POA: Insufficient documentation

## 2016-03-17 DIAGNOSIS — E7801 Familial hypercholesterolemia: Secondary | ICD-10-CM | POA: Insufficient documentation

## 2016-03-17 DIAGNOSIS — Z90710 Acquired absence of both cervix and uterus: Secondary | ICD-10-CM | POA: Diagnosis not present

## 2016-03-17 DIAGNOSIS — Z90722 Acquired absence of ovaries, bilateral: Secondary | ICD-10-CM | POA: Insufficient documentation

## 2016-03-17 DIAGNOSIS — Z7982 Long term (current) use of aspirin: Secondary | ICD-10-CM | POA: Diagnosis not present

## 2016-03-17 DIAGNOSIS — Z79899 Other long term (current) drug therapy: Secondary | ICD-10-CM | POA: Diagnosis not present

## 2016-03-17 DIAGNOSIS — Z951 Presence of aortocoronary bypass graft: Secondary | ICD-10-CM | POA: Insufficient documentation

## 2016-03-17 DIAGNOSIS — Z888 Allergy status to other drugs, medicaments and biological substances status: Secondary | ICD-10-CM | POA: Insufficient documentation

## 2016-03-17 DIAGNOSIS — E039 Hypothyroidism, unspecified: Secondary | ICD-10-CM | POA: Diagnosis not present

## 2016-03-17 DIAGNOSIS — I2510 Atherosclerotic heart disease of native coronary artery without angina pectoris: Secondary | ICD-10-CM | POA: Insufficient documentation

## 2016-03-17 DIAGNOSIS — Z841 Family history of disorders of kidney and ureter: Secondary | ICD-10-CM | POA: Diagnosis not present

## 2016-03-17 NOTE — Patient Instructions (Signed)
Nothing in the vagina.  Follow up with Dr. Alycia Rossetti on November 14 or sooner if needed.  Please call for any questions or concerns.

## 2016-04-13 ENCOUNTER — Ambulatory Visit: Payer: Medicare Other | Attending: Gynecologic Oncology | Admitting: Gynecologic Oncology

## 2016-04-13 ENCOUNTER — Encounter: Payer: Self-pay | Admitting: Gynecologic Oncology

## 2016-04-13 VITALS — BP 171/77 | HR 72 | Temp 98.4°F | Resp 18 | Ht 61.0 in | Wt 144.1 lb

## 2016-04-13 DIAGNOSIS — Z7982 Long term (current) use of aspirin: Secondary | ICD-10-CM | POA: Diagnosis not present

## 2016-04-13 DIAGNOSIS — Z90722 Acquired absence of ovaries, bilateral: Secondary | ICD-10-CM | POA: Insufficient documentation

## 2016-04-13 DIAGNOSIS — E7801 Familial hypercholesterolemia: Secondary | ICD-10-CM | POA: Insufficient documentation

## 2016-04-13 DIAGNOSIS — Z823 Family history of stroke: Secondary | ICD-10-CM | POA: Diagnosis not present

## 2016-04-13 DIAGNOSIS — I2510 Atherosclerotic heart disease of native coronary artery without angina pectoris: Secondary | ICD-10-CM | POA: Diagnosis not present

## 2016-04-13 DIAGNOSIS — N3941 Urge incontinence: Secondary | ICD-10-CM | POA: Diagnosis not present

## 2016-04-13 DIAGNOSIS — Z951 Presence of aortocoronary bypass graft: Secondary | ICD-10-CM | POA: Insufficient documentation

## 2016-04-13 DIAGNOSIS — Z888 Allergy status to other drugs, medicaments and biological substances status: Secondary | ICD-10-CM | POA: Diagnosis not present

## 2016-04-13 DIAGNOSIS — M1990 Unspecified osteoarthritis, unspecified site: Secondary | ICD-10-CM | POA: Insufficient documentation

## 2016-04-13 DIAGNOSIS — I10 Essential (primary) hypertension: Secondary | ICD-10-CM | POA: Diagnosis not present

## 2016-04-13 DIAGNOSIS — Z79899 Other long term (current) drug therapy: Secondary | ICD-10-CM | POA: Insufficient documentation

## 2016-04-13 DIAGNOSIS — E039 Hypothyroidism, unspecified: Secondary | ICD-10-CM | POA: Insufficient documentation

## 2016-04-13 DIAGNOSIS — C541 Malignant neoplasm of endometrium: Secondary | ICD-10-CM | POA: Insufficient documentation

## 2016-04-13 DIAGNOSIS — Z881 Allergy status to other antibiotic agents status: Secondary | ICD-10-CM | POA: Insufficient documentation

## 2016-04-13 DIAGNOSIS — Z90710 Acquired absence of both cervix and uterus: Secondary | ICD-10-CM | POA: Insufficient documentation

## 2016-04-13 DIAGNOSIS — Z8249 Family history of ischemic heart disease and other diseases of the circulatory system: Secondary | ICD-10-CM | POA: Insufficient documentation

## 2016-04-13 DIAGNOSIS — R1030 Lower abdominal pain, unspecified: Secondary | ICD-10-CM

## 2016-04-13 NOTE — Patient Instructions (Addendum)
We will call you with the results of your urinalysis and culture.  Please return to see [place] in 6 months for routine follow-up. After that visit, we'll begin alternating appointments with Dr. Benjie Karvonen. We will notify you of the results of your urine culture. Happy holidays!

## 2016-04-13 NOTE — Progress Notes (Signed)
Consult Note: Gyn-Onc  Consult was requested by Dr. Benjie Karvonen for the evaluation of Shannon Scott 75 y.o. female  CC:  Chief Complaint  Patient presents with  . Endometrial Cancer    Follow up    Assessment/Plan:  Shannon Scott  is a 75 y.o.  year old with Stage IA grade 2 endometrioid adenocarcinoma Status post definitive treatment 12/30/2015. She only had 30% depth of myometrial invasion, no lymphovascular space involvement, and low risk factors for recurrence therefore adjuvant therapy not recommended in accordance to NCCN guidelines.   We will check a urinalysis today to ensure that she does not have a UTI. I will treat her presumptively on the basis of the urinalysis and we will follow up with final culture. She will return to see [place] in 6 months for scheduled GYN oncology follow-up.    HPI: Shannon Scott is a 75 year old G2P2 who was seen in consultation at the request of Dr Benjie Karvonen for grade 1-2 endometrial cancer.  The patient began noticing postmenopausal bleeding in June, 2017. She saw her primary care physician who determined there was no blood in the urine, and she was then referred to Dr Benjie Karvonen who, on 11/21/15 performed a TVUS which showed a uterus measuring 6x2.7x3.3cm with normal ovaries. There was a thickened endometrium of 6.51m. A pipelle endometrial biopsy was performed on 11/21/15 which showed FIGO grade 1-2 endometrial cancer.  The pap from the same date was normal.  The patient has a history of familial hypercholesterolemia. She has a history of a 3 vessel CABG in 2005. She has never had a CVA, and recent carotid dopplers show 50% occlusion. She reports symptoms concerning for LE claudication with ambulation and stairs. She is not a smoker.  On 12/30/15 she underwent robotic assisted total hysterectomy, BSO, pelvic and PA SLN biopsy. 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  Postoperative Diagnosis: same.  Surgery: Total robotic  hysterectomy bilateral salpingo-oophorectomy, left pelvic, right para-aortic and bifurcation SLN removal  Operative findings:  1) Colon adherent to posterior uterus, left sidewall and appendix 2) 2 right para-aortic nodes for SLN (high and PA node) 3) Aortic bifurcation nodes 4) Left obturator node  Diagnosis 1. Lymph node, sentinel, biopsy, right obturator - ONE BENIGN LYMPH NODE (0/1). 2. Lymph node, sentinel, biopsy, right peri-aortic - ONE BENIGN LYMPH NODE (0/1). 3. Lymph node, sentinel, biopsy, right lower peri-aortic - ONE BENIGN LYMPH NODE (0/1). 4. Lymph node, sentinel, biopsy, aortic bifurcation - ONE BENIGN LYMPH NODE (0/1). 5. Lymph node, sentinel, biopsy, left obturator - ONE BENIGN LYMPH NODE (0/1). 6. Uterus +/- tubes/ovaries, neoplastic - ENDOMETRIAL ADENOCARCINOMA, 1.7 CM WITH SUPERFICIAL MYOMETRIAL INVASION. - MARGINS NOT INVOLVED. - CERVIX, BILATERAL OVARIES AND BILATERAL FALLOPIAN TUBES FREE OF TUMOR. Microscopic Comment 6. ONCOLOGY TABLE-UTERUS, CARCINOMA OR CARCINOSARCOMA Specimen: Uterus with bilateral fallopian tubes and ovaries and sentinel lymph node biopsies. Procedure: Hysterectomy with sentinel lymph nodes. Lymph node sampling performed: Yes. Specimen integrity: Intact. Maximum tumor size: 1.7 cm Histologic type: Endometrioid with squamous differentiation. Grade: 2 Myometrial invasion: 0.3 cm where myometrium is 1 cm in thickness Cervical stromal involvement: No. Extent of involvement of other organs: None, identified. Lymph - vascular invasion: Not identified.  Interval History: She was seen by Dr. RDenman Georgefor brief postoperative check on October 18. At that time there is noted to be some thinning at the top of the vaginal cuff. She comes in today for repeat exam. She states that she just hasn't been well  since the surgery. She's also complaining of having been sick when she got off her cruise and that that she was moving to much in her lower  extremities with swollen that is all improved. She does complain of some discomfort with voiding and also has pressure with bowel movements. She states that her stool caliber has change but she is taking marrow laxative fairly regularly which is not something she was doing before. She is complaining of some urge incontinence with no dysuria. She does take Advil occasionally. She denies any vaginal bleeding. She states that she thought there was an issue with her blood pressure medications and she's adjusted those and discuss that with her primary care physician. She stay she's not eating as much as she used to though her weight is been very stable at 144 pounds.   Current Meds:  Outpatient Encounter Prescriptions as of 04/13/2016  Medication Sig  . aspirin EC 81 MG tablet Take 81 mg by mouth daily.  Marland Kitchen docusate sodium (COLACE) 100 MG capsule Take 100 mg by mouth daily.  Marland Kitchen ibuprofen (ADVIL) 200 MG tablet Take 200 mg by mouth every 6 (six) hours as needed for pain. Reported on 12/17/2015  . irbesartan-hydrochlorothiazide (AVALIDE) 150-12.5 MG tablet   . levothyroxine (SYNTHROID, LEVOTHROID) 50 MCG tablet Take 50 mcg by mouth daily.   Marland Kitchen loratadine (CLARITIN) 10 MG tablet Take 10 mg by mouth daily as needed for allergies. Reported on 12/17/2015  . metoprolol succinate (TOPROL-XL) 50 MG 24 hr tablet Take 50 mg by mouth daily.  . traMADol (ULTRAM) 50 MG tablet    No facility-administered encounter medications on file as of 04/13/2016.     Allergy:  Allergies  Allergen Reactions  . Statins     Myalgias and memory problems  . Ciprofloxacin Itching    Splotchy redness with itching during IV infusion localized to arm.    Social Hx:   Social History   Social History  . Marital status: Widowed    Spouse name: N/A  . Number of children: 2  . Years of education: N/A   Occupational History  .  Retired   Social History Main Topics  . Smoking status: Never Smoker  . Smokeless tobacco: Never  Used  . Alcohol use No  . Drug use: No  . Sexual activity: Not on file   Other Topics Concern  . Not on file   Social History Narrative  . No narrative on file    Past Surgical Hx:  Past Surgical History:  Procedure Laterality Date  . ABDOMINAL HYSTERECTOMY    . Carotid Doppler  02/2012   40-59% right int carotid artery stenosis; 60-79% L int carotid stenosis; L carotid bruit  . CORONARY ARTERY BYPASS GRAFT  03/12/2004   LIMA to LAD, SVG to circumflex, SVG to PDA  . ROBOTIC ASSISTED TOTAL HYSTERECTOMY WITH BILATERAL SALPINGO OOPHERECTOMY Bilateral 12/30/2015   Procedure: XI ROBOTIC ASSISTED TOTAL HYSTERECTOMY WITH BILATERAL SALPINGO OOPHORECTOMY WITH SENTAL LYMPH NODE BIOPSY;  Surgeon: Nancy Marus, MD;  Location: [facility];  Service: Gynecology;  Laterality: Bilateral;  . TONSILLECTOMY    . TRANSTHORACIC ECHOCARDIOGRAM  04/2007   EF>55%; mild MR; mild-mod TR; mild pulm HTN; mild calcification of aortiv valve leaflets with mild valvular aortic stenosis  . TUBAL LIGATION      Past Medical Hx:  Past Medical History:  Diagnosis Date  . Arthritis   . Asthma    allergy induced  . Bilateral carotid artery disease ([place])  L carotid bruit  . CAD (coronary artery disease)   . Cancer ([place])   . Constipation   . Dyslipidemia    intolerant to statins, welchol, niacin, zetia  . History of blood transfusion   . History of nuclear stress test 04/24/2012   lexiscan; normal study  . Hypertension   . Hypothyroidism   . Postoperative nausea and vomiting 01/02/2016    Past Gynecological History:  See HPI  No LMP recorded. Patient has had a hysterectomy.  Family Hx:  Family History  Problem Relation Age of Onset  . Hypertension Mother   . Heart disease Mother   . Stroke Father   . Heart disease Father   . Kidney disease Brother   . Heart disease Brother     also HTN, hyperlipidemia  . Heart attack Brother   . Stroke Sister     x2  . Hypertension Sister   . Heart disease Sister      Vitals:  Blood pressure (!) 171/77, pulse 72, temperature 98.4 F (36.9 C), temperature source Oral, resp. rate 18, height 5' 1"  (1.549 m), weight 144 lb 1.6 oz (65.4 kg), SpO2 98 %. BMI 28kg/m2  Physical Exam: WD in NAD Abdomen: Well-healing surgical incisions. Abdomen is soft, nontender, nondistended. There is no evidence of an incisional hernias.  Genito Urinary: External genitalia within normal limits. Vagina is markedly atrophic. The vaginal cuff is visualized. The suture line is intact. Bimanual examination reveals no fluctuance or masses. There is Mild tenderness in the suprapubic region.  Extremities: No edema   Matilde Pottenger A., MD  04/13/2016, 2:40 PM

## 2016-04-14 ENCOUNTER — Other Ambulatory Visit (HOSPITAL_BASED_OUTPATIENT_CLINIC_OR_DEPARTMENT_OTHER): Payer: Medicare Other

## 2016-04-14 DIAGNOSIS — C541 Malignant neoplasm of endometrium: Secondary | ICD-10-CM

## 2016-04-14 DIAGNOSIS — R1030 Lower abdominal pain, unspecified: Secondary | ICD-10-CM

## 2016-04-14 LAB — URINALYSIS, MICROSCOPIC - CHCC
Bilirubin (Urine): NEGATIVE
Blood: NEGATIVE
Glucose: NEGATIVE mg/dL
Ketones: NEGATIVE mg/dL
Nitrite: NEGATIVE
Protein: NEGATIVE mg/dL
Specific Gravity, Urine: 1.005 (ref 1.003–1.035)
Urobilinogen, UR: 0.2 mg/dL (ref 0.2–1)
pH: 6.5 (ref 4.6–8.0)

## 2016-04-15 ENCOUNTER — Encounter: Payer: Self-pay | Admitting: Cardiology

## 2016-04-15 ENCOUNTER — Telehealth: Payer: Self-pay

## 2016-04-15 ENCOUNTER — Ambulatory Visit (INDEPENDENT_AMBULATORY_CARE_PROVIDER_SITE_OTHER): Payer: Medicare Other | Admitting: Cardiology

## 2016-04-15 VITALS — BP 156/70 | HR 75 | Ht 61.0 in | Wt 143.2 lb

## 2016-04-15 DIAGNOSIS — K5730 Diverticulosis of large intestine without perforation or abscess without bleeding: Secondary | ICD-10-CM | POA: Diagnosis not present

## 2016-04-15 DIAGNOSIS — R1030 Lower abdominal pain, unspecified: Secondary | ICD-10-CM | POA: Diagnosis not present

## 2016-04-15 DIAGNOSIS — E785 Hyperlipidemia, unspecified: Secondary | ICD-10-CM | POA: Diagnosis not present

## 2016-04-15 DIAGNOSIS — Z951 Presence of aortocoronary bypass graft: Secondary | ICD-10-CM | POA: Diagnosis not present

## 2016-04-15 DIAGNOSIS — I898 Other specified noninfective disorders of lymphatic vessels and lymph nodes: Secondary | ICD-10-CM | POA: Diagnosis not present

## 2016-04-15 DIAGNOSIS — I10 Essential (primary) hypertension: Secondary | ICD-10-CM

## 2016-04-15 DIAGNOSIS — I350 Nonrheumatic aortic (valve) stenosis: Secondary | ICD-10-CM | POA: Diagnosis not present

## 2016-04-15 DIAGNOSIS — K5790 Diverticulosis of intestine, part unspecified, without perforation or abscess without bleeding: Secondary | ICD-10-CM | POA: Diagnosis not present

## 2016-04-15 DIAGNOSIS — C541 Malignant neoplasm of endometrium: Secondary | ICD-10-CM | POA: Diagnosis not present

## 2016-04-15 DIAGNOSIS — R3914 Feeling of incomplete bladder emptying: Secondary | ICD-10-CM | POA: Diagnosis not present

## 2016-04-15 DIAGNOSIS — E7801 Familial hypercholesterolemia: Secondary | ICD-10-CM

## 2016-04-15 MED ORDER — IRBESARTAN 150 MG PO TABS: 150.0000 mg | ORAL_TABLET | Freq: Every day | ORAL | 3 refills | Status: DC

## 2016-04-15 NOTE — Assessment & Plan Note (Signed)
S/p hysterectomy and bilateral saplingo oophorectomy Aug 2017 with post op nausea and vomiting requiring re admission.

## 2016-04-15 NOTE — Patient Instructions (Addendum)
Medication Instructions:  START Irbesartan 150mg  Take 1 tablet once a day  Labwork: Your physician recommends that you return for lab work in: On day of appt with PharD-FASTING LIPIDS, LFT  Testing/Procedures: None   Follow-Up: Your physician recommends that you return for lab work in: February 2018 with DR HILTY.  Your physician recommends that you schedule a follow-up appointment in: [facility] with Pharm D for PCSK9 IN January 2018.  Any Other Special Instructions Will Be Listed Below (If Applicable).     If you need a refill on your cardiac medications before your next appointment, please call your pharmacy.

## 2016-04-15 NOTE — Assessment & Plan Note (Signed)
With normal LVF -Echo 2016

## 2016-04-15 NOTE — Telephone Encounter (Signed)
Orders received form Melissa Cross, APNP to contact the patient with her U/A results and that the urine culture is pending. Attempted to reach the patient , no answer, left a detailed message with call back requested to ensure the patient received the message.

## 2016-04-15 NOTE — Assessment & Plan Note (Signed)
October 2005, LIMA to LAD, SVG to circumflex and SVG to PDA Myoview negative 2013

## 2016-04-15 NOTE — Assessment & Plan Note (Signed)
Pt's BP has been running high

## 2016-04-15 NOTE — Assessment & Plan Note (Signed)
Intolerant to statins. 

## 2016-04-15 NOTE — Progress Notes (Signed)
04/15/2016 Shannon Scott   1941-04-01  AT:[id number]  Primary Physician Shannon Spurling, MD Primary Cardiologist: Dr Shannon Scott  HPI:  Pleasant 75 y/o female followed by Dr Shannon Scott with a history of CAD- s/p CABG '05 with negative Myoview 2013, dyslipidemia with statin intolerance, and HTN. She had surgery for endometrial cancer in Aug this year. She was re admitted 24 hrs later with nausea and vomiting. After discharge hse had some problems with low B/P according to to phone notes in EPIC. She was told to contact her PCP (Dr Shannon Scott) and I suspect he suggested she hold her Avapro/HCTZ. The pt the went on a cruise to the [place] and by the time she returned she had elevated B/P with LE edema, and dyspnea. She is in the office for further evaluation. She tells me her edema is better but her B/P is still running high. She denies orthopnea, chest pain, or PND.    Current Outpatient Prescriptions  Medication Sig Dispense Refill  . aspirin EC 81 MG tablet Take 81 mg by mouth daily.    Marland Kitchen docusate sodium (COLACE) 100 MG capsule Take 100 mg by mouth daily.    Marland Kitchen ibuprofen (ADVIL) 200 MG tablet Take 200 mg by mouth every 6 (six) hours as needed for pain. Reported on 12/17/2015    . levothyroxine (SYNTHROID, LEVOTHROID) 50 MCG tablet Take 50 mcg by mouth daily.     Marland Kitchen loratadine (CLARITIN) 10 MG tablet Take 10 mg by mouth daily as needed for allergies. Reported on 12/17/2015    . metoprolol succinate (TOPROL-XL) 50 MG 24 hr tablet Take 50 mg by mouth daily.  0  . traMADol (ULTRAM) 50 MG tablet     . irbesartan (AVAPRO) 150 MG tablet Take 1 tablet (150 mg total) by mouth daily. 30 tablet 3   No current facility-administered medications for this visit.     Allergies  Allergen Reactions  . Statins     Myalgias and memory problems  . Ciprofloxacin Itching    Splotchy redness with itching during IV infusion localized to arm.    Social History   Social History  . Marital status: Widowed    Spouse name:  N/A  . Number of children: 2  . Years of education: N/A   Occupational History  .  Retired   Social History Main Topics  . Smoking status: Never Smoker  . Smokeless tobacco: Never Used  . Alcohol use No  . Drug use: No  . Sexual activity: Not on file   Other Topics Concern  . Not on file   Social History Narrative  . No narrative on file     Review of Systems: General: negative for chills, fever, night sweats or weight changes.  Cardiovascular: negative for chest pain, dyspnea on exertion, edema, orthopnea, palpitations, paroxysmal nocturnal dyspnea or shortness of breath Dermatological: negative for rash Respiratory: negative for cough or wheezing Urologic: negative for hematuria Abdominal: negative for nausea, vomiting, diarrhea, bright red blood per rectum, melena, or hematemesis Neurologic: negative for visual changes, syncope, or dizziness All other systems reviewed and are otherwise negative except as noted above.    Blood pressure (!) 156/70, pulse 75, height 5\' 1"  (1.549 m), weight 143 lb 4 oz (65 kg), SpO2 98 %.  General appearance: alert, cooperative and no distress Neck: no JVD and carotid bruits, R>L Lungs: clear to auscultation bilaterally Heart: regular rate and rhythm and soft systolic murmur Extremities: no edema Skin: Skin color, texture, turgor normal.  No rashes or lesions Neurologic: Grossly normal   ASSESSMENT AND PLAN:   Uncontrolled hypertension Pt's B/P has been running high.   Endometrial cancer [facility]) S/p hysterectomy and bilateral saplingo oophorectomy Aug 2017 with post op nausea and vomiting requiring re admission.  S/P CABG x 02 March 2004, LIMA to LAD, SVG to circumflex and SVG to PDA Myoview negative 2013  Mild aortic stenosis With normal LVF -Echo 2016  Familial hypercholesteremia Intolerant to statins   PLAN  I suspect her Avapro/ HCTZ was stopped post op in Aug and never resumed. She also tells me she is having problems  with urinary frequency, burning, and stress incontinence. She is going to see a Urologist this afternoon. I suggested we resume her Avapro 150 mg daily (without the HCTZ). I also reffered her to [facility] our Pharmacist to see if we can get her on a PCSK9 agent. She'll follow up with Dr Shannon Scott I Feb.   Kerin Ransom PA-C 04/15/2016 10:44 AM

## 2016-04-16 ENCOUNTER — Telehealth: Payer: Self-pay | Admitting: Gynecologic Oncology

## 2016-04-16 LAB — URINE CULTURE

## 2016-04-16 NOTE — Telephone Encounter (Signed)
Attempted to call patient to touch base with her about CT scan ordered by Dr. Jeffie Pollock with Urology.  Since patient is having urinary symptoms/pressure, it had been discussed to possibly drain the fluid collection to see if her symptoms improve.  Advised patient to please call the office.

## 2016-04-19 ENCOUNTER — Telehealth: Payer: Self-pay | Admitting: Gynecologic Oncology

## 2016-04-19 NOTE — Telephone Encounter (Signed)
Spoke with patient about CT Urogram findings.  She states over the weekend, she took advil which helped with the discomfort.  She only needed one on Sunday.  She states today she feels even better.  Discussed CT results and the option to have the fluid collection drained since she was having symptoms.  Patient states that since she is improving, she would like to wait several days and call our office if the discomfort persists or worsens.  No other concerns voiced.  Advised to call for any needs or concerns and to call with an update.

## 2016-04-26 ENCOUNTER — Telehealth: Payer: Self-pay | Admitting: Gynecologic Oncology

## 2016-04-26 NOTE — Telephone Encounter (Signed)
Left message with patient about her symptoms.  She spoke with our RN this earlier today complaining about her urinary symptoms.

## 2016-04-27 ENCOUNTER — Telehealth: Payer: Self-pay | Admitting: Gynecologic Oncology

## 2016-04-27 ENCOUNTER — Other Ambulatory Visit: Payer: Self-pay | Admitting: Gynecologic Oncology

## 2016-04-27 DIAGNOSIS — R188 Other ascites: Secondary | ICD-10-CM

## 2016-04-27 NOTE — Telephone Encounter (Signed)
Spoke with the patient about upcoming appointment at [facility] on Nov 30 at 9 am with arrival time of 8:45am.  She wanted to meet with the Interventional Radiologist before proceeding with drainage of the pelvic fluid collection.  She had multiple questions and was unsure about what route to take.  Spoke with Jocelyn Lamer with [facility] earlier and made the appt.  Records faxed along with the email from Dr. Jeffie Pollock who ordered a CT Urogram due to urinary symptoms.  Patient reported her symptoms as about the same.  Discomfort and pressure when urinating or having a bowel movement.  She states she cannot eat a significant amount because she becomes nauseated and feels her bowels may be becoming affected from the pelvic fluid collection.

## 2016-04-27 NOTE — Telephone Encounter (Signed)
Left message for patient asking her to please call the office to discuss her symptoms and to see if she is willing to have the fluid collection seen on CT drained to alleviate her symptoms.

## 2016-04-29 ENCOUNTER — Ambulatory Visit
Admission: RE | Admit: 2016-04-29 | Discharge: 2016-04-29 | Disposition: A | Discharge status: Home / Self Care | Payer: Medicare Other | Source: Ambulatory Visit | Attending: Gynecologic Oncology | Admitting: Gynecologic Oncology

## 2016-04-29 ENCOUNTER — Other Ambulatory Visit (HOSPITAL_COMMUNITY): Payer: Self-pay | Admitting: Interventional Radiology

## 2016-04-29 DIAGNOSIS — R1902 Left upper quadrant abdominal swelling, mass and lump: Secondary | ICD-10-CM | POA: Diagnosis not present

## 2016-04-29 DIAGNOSIS — R188 Other ascites: Secondary | ICD-10-CM

## 2016-04-29 HISTORY — PX: IR GENERIC HISTORICAL: IMG1180011

## 2016-04-29 NOTE — Consult Note (Signed)
Chief Complaint: History of hysterectomy, now with persistent indeterminate symptomatic fluid collection.  Referring Physician(s): Nancy Marus (Ob-Gyn) Jeffie Pollock (Urology)  History of Present Illness: Shannon Scott is a 75 y.o. female with past medical history significant for CAD, bilateral carotid artery disease, dyslipidemia, hypertension, hypothyroidism and postoperative nausea and vomiting who underwent a total hysterectomy in August of this year for endometrial cancer. Subsequent CT scan performed 01/14/2016 (performed for left lower quadrant abdominal pain) demonstrated an indeterminate proximal was 7.2 cm fluid collection within the left pelvic sidewall as well as a smaller, approximately 2.9 cm fluid collection along the right retroperitoneum.  Cecal collections were felt to represent seromas, no intervention was performed at that time.  Unfortunately, the patient's left lower quadrant abdominal/pelvic pain has persisted. The patient states the pain is worse at the time of urinating and bowel movements. For this she was referred to Dr. Jeffie Pollock (Urology) was workup was negative for urinary tract infection however repeat noncontrast CT of the abdomen and pelvis demonstrated a persistent approximately 7 cm fluid collection within the left lower pelvis as well as a approximately 2.1 cm fluid collection within the right retroperitoneum again favored to represent residual seromas.   Patient is now been referred to the interventional radiology clinic for discussion of appropriateness of CT-guided aspiration and/or drainage catheter placement. She is unaccompanied and serves as her own historian.  The patient admits to constipation though denies diarrhea or bloody stools. No hematuria. No flank pain. No fever or unintentional weight loss. No change in appetite or activity level.   Past Medical History:  Diagnosis Date  . Arthritis   . Asthma    allergy induced  . Bilateral carotid artery  disease (HCC)    L carotid bruit  . CAD (coronary artery disease)   . Cancer ([place])   . Constipation   . Dyslipidemia    intolerant to statins, welchol, niacin, zetia  . History of blood transfusion   . History of nuclear stress test 04/24/2012   lexiscan; normal study  . Hypertension   . Hypothyroidism   . Postoperative nausea and vomiting 01/02/2016    Past Surgical History:  Procedure Laterality Date  . ABDOMINAL HYSTERECTOMY    . Carotid Doppler  02/2012   40-59% right int carotid artery stenosis; 60-79% L int carotid stenosis; L carotid bruit  . CORONARY ARTERY BYPASS GRAFT  03/12/2004   LIMA to LAD, SVG to circumflex, SVG to PDA  . ROBOTIC ASSISTED TOTAL HYSTERECTOMY WITH BILATERAL SALPINGO OOPHERECTOMY Bilateral 12/30/2015   Procedure: XI ROBOTIC ASSISTED TOTAL HYSTERECTOMY WITH BILATERAL SALPINGO OOPHORECTOMY WITH SENTAL LYMPH NODE BIOPSY;  Surgeon: Nancy Marus, MD;  Location: [facility];  Service: Gynecology;  Laterality: Bilateral;  . TONSILLECTOMY    . TRANSTHORACIC ECHOCARDIOGRAM  04/2007   EF>55%; mild MR; mild-mod TR; mild pulm HTN; mild calcification of aortiv valve leaflets with mild valvular aortic stenosis  . TUBAL LIGATION      Allergies: Statins and Ciprofloxacin  Medications: Prior to Admission medications   Medication Sig Start Date End Date Taking? Authorizing Provider  aspirin EC 81 MG tablet Take 81 mg by mouth daily.   Yes Historical Provider, MD  docusate sodium (COLACE) 100 MG capsule Take 100 mg by mouth daily.   Yes Historical Provider, MD  ibuprofen (ADVIL) 200 MG tablet Take 200 mg by mouth every 6 (six) hours as needed for pain. Reported on 12/17/2015   Yes Historical Provider, MD  irbesartan (AVAPRO) 150 MG tablet Take 1  tablet (150 mg total) by mouth daily. 04/15/16  Yes Luke K Kilroy, PA-C  levothyroxine (SYNTHROID, LEVOTHROID) 50 MCG tablet Take 50 mcg by mouth daily.    Yes Historical Provider, MD  loratadine (CLARITIN) 10 MG tablet Take 10 mg by  mouth daily as needed for allergies. Reported on 12/17/2015   Yes Historical Provider, MD  metoprolol succinate (TOPROL-XL) 50 MG 24 hr tablet Take 50 mg by mouth daily. 03/05/16  Yes Historical Provider, MD  traMADol Veatrice Bourbon) 50 MG tablet  01/02/16   Historical Provider, MD     Family History  Problem Relation Age of Onset  . Hypertension Mother   . Heart disease Mother   . Stroke Father   . Heart disease Father   . Kidney disease Brother   . Heart disease Brother     also HTN, hyperlipidemia  . Heart attack Brother   . Stroke Sister     x2  . Hypertension Sister   . Heart disease Sister     Social History   Social History  . Marital status: Widowed    Spouse name: N/A  . Number of children: 2  . Years of education: N/A   Occupational History  .  Retired   Social History Main Topics  . Smoking status: Never Smoker  . Smokeless tobacco: Never Used  . Alcohol use No  . Drug use: No  . Sexual activity: Not on file   Other Topics Concern  . Not on file   Social History Narrative  . No narrative on file    ECOG Status: 1 - Symptomatic but completely ambulatory  Review of Systems: A 12 point ROS discussed and pertinent positives are indicated in the HPI above.  All other systems are negative.  Review of Systems  Constitutional: Negative for activity change, appetite change, fatigue, fever and unexpected weight change.  Respiratory: Negative.   Cardiovascular: Negative.   Gastrointestinal: Positive for abdominal pain and constipation. Negative for blood in stool and diarrhea.  Genitourinary: Positive for dysuria. Negative for difficulty urinating, flank pain and hematuria.    Vital Signs: BP (!) 198/95 (BP Location: Left Arm, Patient Position: Sitting, Cuff Size: Normal)   Pulse 71   Temp 98.2 F (36.8 C) (Oral)   Resp 15   Ht 5' 1"  (1.549 m)   Wt 144 lb (65.3 kg)   SpO2 99%   BMI 27.21 kg/m   Physical Exam  Constitutional: She appears well-developed and  well-nourished.  HENT:  Head: Atraumatic.  Cardiovascular: Normal rate.   Murmur heard. Holosystolic murmur.  Pulmonary/Chest: Effort normal and breath sounds normal.  Abdominal: Soft. Bowel sounds are normal. There is tenderness.  Patient reports pain with palpation of the midline of the left lower abdomen/pelvis.  Psychiatric: She has a normal mood and affect.  Patient appears anxious and somewhat un-decisive.  Nursing note and vitals reviewed.  Imaging:  Selected images from CT scan of the abdomen and pelvis performed 01/14/2016 and 04/15/2016 were reviewed in detail with the patient.  Labs:  CBC:  Recent Labs  12/26/15 1200 12/31/15 0442 01/01/16 0605  WBC 6.4 9.9 10.7*  HGB 12.6 11.4* 11.3*  HCT 38.4 34.2* 32.3*  PLT 273 245 220    COAGS: No results for input(s): INR, APTT in the last 8760 hours.  BMP:  Recent Labs  12/31/15 0442 01/01/16 0605 01/01/16 1348 01/02/16 0526  NA 134* 124* 129* 138  K 3.3* 2.9* 2.8* 3.5  CL 100* 88* 94* 104  CO2 28 27 30 28   GLUCOSE 127* 109* 106* 93  BUN 7 10 7 6   CALCIUM 8.8* 8.6* 8.0* 8.4*  CREATININE 0.84 0.60 0.69 0.74  GFRNONAA >60 >60 >60 >60  GFRAA >60 >60 >60 >60    LIVER FUNCTION TESTS:  Recent Labs  12/26/15 1200  BILITOT 0.7  AST 15  ALT 11*  ALKPHOS 100  PROT 8.0  ALBUMIN 4.4    TUMOR MARKERS: No results for input(s): AFPTM, CEA, CA199, CHROMGRNA in the last 8760 hours.  Assessment and Plan:  AAREN KROG is a 75 y.o. female with past medical history significant for CAD, bilateral carotid artery disease, dyslipidemia, hypertension, hypothyroidism and postoperative nausea and vomiting who underwent a total hysterectomy in August of this year for endometrial cancer.   Workup for persistent left lower quadrant abdominal/pelvic pain demonstrated a persistent approximately 7 cm presumed seroma within the left lower abdomen/pelvis.  Selected images from CT scan of the abdomen and pelvis performed  01/14/2016 and 04/15/2016 were reviewed in detail with the patient.  I explained that it is unlikely this presumed seroma is infected given the stability of size compared to the 01/14/2016 examination.   Given the patient's persistent symptoms, I feel this patient would benefit from a diagnostic and therapeutic CT-guided aspiration of the dominant collection within the left lower abdomen/pelvis.  The smaller (approximately 2.1 cm ) fluid collection within the right retroperitoneum also likely represents a seroma though is too small for percutaneous sampling or aspiration.  I explained to the patient that I am hopeful only a aspiration will be necessary (in lieu of definitive drainage catheter placement) given the stability of this fluid collection for the past 3 months. I asked explained that there is a chance that the fluid collection could reaccumulate and reproduce her symptoms and may require additional future interventions.  Risks and Benefits of CT-guided aspiration/drainage catheter placement were discussed in detail with the patient including bleeding, infection, damage to adjacent structures, bowel perforation/fistula connection, and sepsis.  All of the patient's questions were answered, patient is agreeable to proceed.  As such, this procedure will be performed on an outpatient basis with conscious sedation at [facility].  We will coordinate to have this procedure performed at the next available appointment when I am scheduled at [facility].  I will send all aspirated fluid for Gram stain/culture as well as cytologic analysis.  Thank you for this interesting consult.  I greatly enjoyed meeting FAIGA STONES and look forward to participating in their care.  A copy of this report was sent to the requesting provider on this date.  Electronically Signed: Sandi Mariscal 04/29/2016, 9:36 AM   I spent a total of 30 Minutes in face to face in clinical consultation, greater than  50% of which was counseling/coordinating care for CT-guided pelvic fluid collection aspiration

## 2016-04-30 ENCOUNTER — Other Ambulatory Visit: Payer: Self-pay | Admitting: General Surgery

## 2016-05-03 ENCOUNTER — Ambulatory Visit (HOSPITAL_COMMUNITY)
Admission: RE | Admit: 2016-05-03 | Discharge: 2016-05-03 | Disposition: A | Discharge status: Home / Self Care | Payer: Medicare Other | Source: Ambulatory Visit | Attending: Interventional Radiology | Admitting: Interventional Radiology

## 2016-05-03 ENCOUNTER — Encounter (HOSPITAL_COMMUNITY): Payer: Self-pay

## 2016-05-03 DIAGNOSIS — Z888 Allergy status to other drugs, medicaments and biological substances status: Secondary | ICD-10-CM | POA: Diagnosis not present

## 2016-05-03 DIAGNOSIS — Z79899 Other long term (current) drug therapy: Secondary | ICD-10-CM | POA: Diagnosis not present

## 2016-05-03 DIAGNOSIS — Z8249 Family history of ischemic heart disease and other diseases of the circulatory system: Secondary | ICD-10-CM | POA: Insufficient documentation

## 2016-05-03 DIAGNOSIS — T8189XA Other complications of procedures, not elsewhere classified, initial encounter: Secondary | ICD-10-CM | POA: Diagnosis not present

## 2016-05-03 DIAGNOSIS — Z90722 Acquired absence of ovaries, bilateral: Secondary | ICD-10-CM | POA: Diagnosis not present

## 2016-05-03 DIAGNOSIS — Z841 Family history of disorders of kidney and ureter: Secondary | ICD-10-CM | POA: Diagnosis not present

## 2016-05-03 DIAGNOSIS — Z823 Family history of stroke: Secondary | ICD-10-CM | POA: Insufficient documentation

## 2016-05-03 DIAGNOSIS — E039 Hypothyroidism, unspecified: Secondary | ICD-10-CM | POA: Diagnosis not present

## 2016-05-03 DIAGNOSIS — M1990 Unspecified osteoarthritis, unspecified site: Secondary | ICD-10-CM | POA: Diagnosis not present

## 2016-05-03 DIAGNOSIS — Z881 Allergy status to other antibiotic agents status: Secondary | ICD-10-CM | POA: Diagnosis not present

## 2016-05-03 DIAGNOSIS — Z90710 Acquired absence of both cervix and uterus: Secondary | ICD-10-CM | POA: Insufficient documentation

## 2016-05-03 DIAGNOSIS — I2510 Atherosclerotic heart disease of native coronary artery without angina pectoris: Secondary | ICD-10-CM | POA: Diagnosis not present

## 2016-05-03 DIAGNOSIS — R188 Other ascites: Secondary | ICD-10-CM

## 2016-05-03 DIAGNOSIS — C541 Malignant neoplasm of endometrium: Secondary | ICD-10-CM | POA: Diagnosis not present

## 2016-05-03 DIAGNOSIS — I10 Essential (primary) hypertension: Secondary | ICD-10-CM | POA: Diagnosis not present

## 2016-05-03 DIAGNOSIS — E785 Hyperlipidemia, unspecified: Secondary | ICD-10-CM | POA: Diagnosis not present

## 2016-05-03 DIAGNOSIS — Z951 Presence of aortocoronary bypass graft: Secondary | ICD-10-CM | POA: Insufficient documentation

## 2016-05-03 LAB — PROTIME-INR
INR: 1.05
Prothrombin Time: 13.7 seconds (ref 11.4–15.2)

## 2016-05-03 LAB — APTT: aPTT: 33 seconds (ref 24–36)

## 2016-05-03 LAB — CBC
HCT: 35.3 % — ABNORMAL LOW (ref 36.0–46.0)
Hemoglobin: 11.5 g/dL — ABNORMAL LOW (ref 12.0–15.0)
MCH: 28.6 pg (ref 26.0–34.0)
MCHC: 32.6 g/dL (ref 30.0–36.0)
MCV: 87.8 fL (ref 78.0–100.0)
Platelets: 328 10*3/uL (ref 150–400)
RBC: 4.02 MIL/uL (ref 3.87–5.11)
RDW: 13.7 % (ref 11.5–15.5)
WBC: 6.1 10*3/uL (ref 4.0–10.5)

## 2016-05-03 MED ORDER — FLUMAZENIL 0.5 MG/5ML IV SOLN
INTRAVENOUS | Status: AC
  Filled 2016-05-03: qty 5

## 2016-05-03 MED ORDER — SODIUM CHLORIDE 0.9 % IV SOLN
INTRAVENOUS | Status: DC
  Administered 2016-05-03: 08:00:00 via INTRAVENOUS

## 2016-05-03 MED ORDER — FENTANYL CITRATE (PF) 100 MCG/2ML IJ SOLN
INTRAMUSCULAR | Status: AC
  Filled 2016-05-03: qty 2

## 2016-05-03 MED ORDER — NALOXONE HCL 0.4 MG/ML IJ SOLN
INTRAMUSCULAR | Status: AC
  Filled 2016-05-03: qty 1

## 2016-05-03 MED ORDER — MIDAZOLAM HCL 2 MG/2ML IJ SOLN
INTRAMUSCULAR | Status: AC | PRN
  Administered 2016-05-03 (×2): 0.5 mg via INTRAVENOUS

## 2016-05-03 MED ORDER — MIDAZOLAM HCL 2 MG/2ML IJ SOLN
INTRAMUSCULAR | Status: AC
  Filled 2016-05-03: qty 2

## 2016-05-03 MED ORDER — FENTANYL CITRATE (PF) 100 MCG/2ML IJ SOLN
INTRAMUSCULAR | Status: AC | PRN
  Administered 2016-05-03 (×2): 25 ug via INTRAVENOUS

## 2016-05-03 NOTE — Procedures (Signed)
Technically successful CT guided aspiration of residual indeterminate fluid collection within the left lower abdomen/pelvis yielding 15 cc of serous, slightly cloudy/milky fluid.   All aspirated samples sent to the laboratory for analysis.   EBL: None No immediate post procedural complications.   Ronny Bacon, MD Pager #: [phone]

## 2016-05-03 NOTE — H&P (Signed)
Chief Complaint: pelvic fluid collection, s/p hysterectomy in August 2017  Referring Physician: Dr. Nancy Marus  Supervising Physician: Sandi Mariscal  Patient Status: [facility] - Out-pt  HPI: Shannon Scott is an 75 y.o. female who was diagnosed with endometrial cancer who underwent a total hysterectomy in August of this year.  She has had intermittent pelvic discomfort and pressure since then.  She had a scan initially that revealed a 7.2cm collection in her left pelvic sidewall and a small one in her right RTP.  These were felt to represent seromas and no intervention was performed.  Her symptoms have persisted though and she began having some pain with urination and bowel movements.  She was sent to Dr. Jeffie Pollock for a work up.  She had a repeat CT scan after work up for UTI Was negative.  These fluid collections persisted.  She was referred to Dr. Pascal Lux last week for evaluation of aspiration.  She presents today for this procedure.  Past Medical History:  Past Medical History:  Diagnosis Date  . Arthritis   . Asthma    allergy induced  . Bilateral carotid artery disease (HCC)    L carotid bruit  . CAD (coronary artery disease)   . Cancer ([place])   . Constipation   . Dyslipidemia    intolerant to statins, welchol, niacin, zetia  . History of blood transfusion   . History of nuclear stress test 04/24/2012   lexiscan; normal study  . Hypertension   . Hypothyroidism   . Postoperative nausea and vomiting 01/02/2016    Past Surgical History:  Past Surgical History:  Procedure Laterality Date  . ABDOMINAL HYSTERECTOMY    . Carotid Doppler  02/2012   40-59% right int carotid artery stenosis; 60-79% L int carotid stenosis; L carotid bruit  . CORONARY ARTERY BYPASS GRAFT  03/12/2004   LIMA to LAD, SVG to circumflex, SVG to PDA  . ROBOTIC ASSISTED TOTAL HYSTERECTOMY WITH BILATERAL SALPINGO OOPHERECTOMY Bilateral 12/30/2015   Procedure: XI ROBOTIC ASSISTED TOTAL HYSTERECTOMY WITH BILATERAL  SALPINGO OOPHORECTOMY WITH SENTAL LYMPH NODE BIOPSY;  Surgeon: Nancy Marus, MD;  Location: [facility];  Service: Gynecology;  Laterality: Bilateral;  . TONSILLECTOMY    . TRANSTHORACIC ECHOCARDIOGRAM  04/2007   EF>55%; mild MR; mild-mod TR; mild pulm HTN; mild calcification of aortiv valve leaflets with mild valvular aortic stenosis  . TUBAL LIGATION      Family History:  Family History  Problem Relation Age of Onset  . Hypertension Mother   . Heart disease Mother   . Stroke Father   . Heart disease Father   . Kidney disease Brother   . Heart disease Brother     also HTN, hyperlipidemia  . Heart attack Brother   . Stroke Sister     x2  . Hypertension Sister   . Heart disease Sister     Social History:  reports that she has never smoked. She has never used smokeless tobacco. She reports that she does not drink alcohol or use drugs.  Allergies:  Allergies  Allergen Reactions  . Statins     Myalgias and memory problems  . Ciprofloxacin Itching    Splotchy redness with itching during IV infusion localized to arm.    Medications: Medications reviewed in epic  Please HPI for pertinent positives, otherwise complete 10 system ROS negative.  Mallampati Score: MD Evaluation Airway: WNL Heart: WNL Abdomen: WNL Chest/ Lungs: WNL ASA  Classification: 3 Mallampati/Airway Score: Two  Physical Exam:  BP (!) 201/60 (BP Location: Right Arm)   Pulse 73   Temp 98.7 F (37.1 C) (Oral)   Resp 16   SpO2 97%  There is no height or weight on file to calculate BMI. General: pleasant, WD, WN white female who is laying in bed in NAD HEENT: head is normocephalic, atraumatic.  Sclera are noninjected.  PERRL.  Ears and nose without any masses or lesions.  Mouth is pink and moist Heart: regular, rate, and rhythm.  Normal s1,s2. +murmur. No obvious gallops, or rubs noted.  Palpable radial and pedal pulses bilaterally Lungs: CTAB, no wheezes, rhonchi, or rales noted.  Respiratory effort  nonlabored Abd: soft, minimal lower abdominal tenderness, ND, +BS, no masses, hernias, or organomegaly MS: all 4 extremities are symmetrical with no cyanosis, clubbing, but trace edema. Psych: A&Ox3 with an appropriate affect.   Labs: Results for orders placed or performed during the hospital encounter of 05/03/16 (from the past 48 hour(s))  APTT     Status: None   Collection Time: 05/03/16  7:37 AM  Result Value Ref Range   aPTT 33 24 - 36 seconds  CBC     Status: Abnormal   Collection Time: 05/03/16  7:37 AM  Result Value Ref Range   WBC 6.1 4.0 - 10.5 K/uL   RBC 4.02 3.87 - 5.11 MIL/uL   Hemoglobin 11.5 (L) 12.0 - 15.0 g/dL   HCT 35.3 (L) 36.0 - 46.0 %   MCV 87.8 78.0 - 100.0 fL   MCH 28.6 26.0 - 34.0 pg   MCHC 32.6 30.0 - 36.0 g/dL   RDW 13.7 11.5 - 15.5 %   Platelets 328 150 - 400 K/uL  Protime-INR     Status: None   Collection Time: 05/03/16  7:37 AM  Result Value Ref Range   Prothrombin Time 13.7 11.4 - 15.2 seconds   INR 1.05     Imaging: No results found.  Assessment/Plan 1. Pelvic fluid collections -we will proceed with aspiration of the dominant fluid collection in the left lower abdomen/pelvis. -labs and vitals have been reviewed.  The patient has white coat syndrome apparently and states her BP is typically much better at home. -Risks and Benefits discussed with the patient including bleeding, infection, damage to adjacent structures, bowel perforation, and sepsis. All of the patient's questions were answered, patient is agreeable to proceed. Consent signed and in chart.    Thank you for this interesting consult.  I greatly enjoyed meeting Shannon Scott and look forward to participating in their care.  A copy of this report was sent to the requesting provider on this date.  Electronically Signed: Henreitta Cea 05/03/2016, 8:47 AM   I spent a total of    25 Minutes in face to face in clinical consultation, greater than 50% of which was  counseling/coordinating care for pelvic fluid collection

## 2016-05-03 NOTE — Discharge Instructions (Signed)
Needle Biopsy, Care After °Introduction °These instructions give you information about caring for yourself after your procedure. Your doctor may also give you more specific instructions. Call your doctor if you have any problems or questions after your procedure. °Follow these instructions at home: °· Rest as told by your doctor. °· Take medicines only as told by your doctor. °· There are many different ways to close and cover the biopsy site, including stitches (sutures), skin glue, and adhesive strips. Follow instructions from your doctor about: °¨ How to take care of your biopsy site. °¨ When and how you should change your bandage (dressing). °¨ When you should remove your dressing. °¨ Removing whatever was used to close your biopsy site. °· Check your biopsy site every day for signs of infection. Watch for: °¨ Redness, swelling, or pain. °¨ Fluid, blood, or pus. °Contact a doctor if: °· You have a fever. °· You have redness, swelling, or pain at the biopsy site, and it lasts longer than a few days. °· You have fluid, blood, or pus coming from the biopsy site. °· You feel sick to your stomach (nauseous). °· You throw up (vomit). °Get help right away if: °· You are short of breath. °· You have trouble breathing. °· Your chest hurts. °· You feel dizzy or you pass out (faint). °· You have bleeding that does not stop with pressure or a bandage. °· You cough up blood. °· Your belly (abdomen) hurts. °This information is not intended to replace advice given to you by your health care provider. Make sure you discuss any questions you have with your health care provider. °Document Released: 04/29/2008 Document Revised: 10/23/2015 Document Reviewed: 05/13/2014 °© 2017 Elsevier °Moderate Conscious Sedation, Adult, Care After °These instructions provide you with information about caring for yourself after your procedure. Your health care provider may also give you more specific instructions. Your treatment has been planned  according to current medical practices, but problems sometimes occur. Call your health care provider if you have any problems or questions after your procedure. °What can I expect after the procedure? °After your procedure, it is common: °· To feel sleepy for several hours. °· To feel clumsy and have poor balance for several hours. °· To have poor judgment for several hours. °· To vomit if you eat too soon. °Follow these instructions at home: °For at least 24 hours after the procedure:  °· Do not: °¨ Participate in activities where you could fall or become injured. °¨ Drive. °¨ Use heavy machinery. °¨ Drink alcohol. °¨ Take sleeping pills or medicines that cause drowsiness. °¨ Make important decisions or sign legal documents. °¨ Take care of children on your own. °· Rest. °Eating and drinking °· Follow the diet recommended by your health care provider. °· If you vomit: °¨ Drink water, juice, or soup when you can drink without vomiting. °¨ Make sure you have little or no nausea before eating solid foods. °General instructions °· Have a responsible adult stay with you until you are awake and alert. °· Take over-the-counter and prescription medicines only as told by your health care provider. °· If you smoke, do not smoke without supervision. °· Keep all follow-up visits as told by your health care provider. This is important. °Contact a health care provider if: °· You keep feeling nauseous or you keep vomiting. °· You feel light-headed. °· You develop a rash. °· You have a fever. °Get help right away if: °· You have trouble breathing. °This information   is not intended to replace advice given to you by your health care provider. Make sure you discuss any questions you have with your health care provider. °Document Released: 03/07/2013 Document Revised: 10/20/2015 Document Reviewed: 09/06/2015 °Elsevier Interactive Patient Education © 2017 [employer] ° °

## 2016-05-03 NOTE — Progress Notes (Signed)
Spoke with pt's daughter Levada Dy, gave d/c instructions over the phone, she will drive up to pick pt up at main entrance.

## 2016-05-04 ENCOUNTER — Other Ambulatory Visit: Payer: Self-pay | Admitting: Gynecologic Oncology

## 2016-05-04 ENCOUNTER — Other Ambulatory Visit (HOSPITAL_COMMUNITY): Payer: Self-pay | Admitting: Interventional Radiology

## 2016-05-04 DIAGNOSIS — R188 Other ascites: Secondary | ICD-10-CM

## 2016-05-08 LAB — AEROBIC/ANAEROBIC CULTURE W GRAM STAIN (SURGICAL/DEEP WOUND)

## 2016-05-08 LAB — AEROBIC/ANAEROBIC CULTURE (SURGICAL/DEEP WOUND): Special Requests: NORMAL

## 2016-05-10 ENCOUNTER — Telehealth: Payer: Self-pay | Admitting: Gynecologic Oncology

## 2016-05-10 DIAGNOSIS — E039 Hypothyroidism, unspecified: Secondary | ICD-10-CM | POA: Diagnosis not present

## 2016-05-10 DIAGNOSIS — I2510 Atherosclerotic heart disease of native coronary artery without angina pectoris: Secondary | ICD-10-CM | POA: Diagnosis not present

## 2016-05-10 DIAGNOSIS — E7800 Pure hypercholesterolemia, unspecified: Secondary | ICD-10-CM | POA: Diagnosis not present

## 2016-05-10 DIAGNOSIS — I10 Essential (primary) hypertension: Secondary | ICD-10-CM | POA: Diagnosis not present

## 2016-05-10 NOTE — Telephone Encounter (Signed)
Called to convey benign nature of results however, unable to leave message to call back. No further intervention necessary of lymphocyst.  Donaciano Eva, MD

## 2016-05-12 ENCOUNTER — Ambulatory Visit
Admission: RE | Admit: 2016-05-12 | Discharge: 2016-05-12 | Disposition: A | Discharge status: Home / Self Care | Payer: Medicare Other | Source: Ambulatory Visit | Attending: Interventional Radiology | Admitting: Interventional Radiology

## 2016-05-12 ENCOUNTER — Other Ambulatory Visit: Payer: Self-pay | Admitting: Interventional Radiology

## 2016-05-12 ENCOUNTER — Telehealth: Payer: Self-pay

## 2016-05-12 DIAGNOSIS — R188 Other ascites: Secondary | ICD-10-CM

## 2016-05-12 DIAGNOSIS — R1902 Left upper quadrant abdominal swelling, mass and lump: Secondary | ICD-10-CM | POA: Diagnosis not present

## 2016-05-12 HISTORY — PX: IR GENERIC HISTORICAL: IMG1180011

## 2016-05-12 NOTE — Progress Notes (Signed)
Patient ID: Shannon Scott, female   DOB: 11-Dec-1940, 75 y.o.   MRN: [id number]        Chief Complaint: Post left lower abdomen/pelvic fluid collection aspiration  Referring Physician(s): Alycia Rossetti (Ob-Gyn) Jeffie Pollock (Urology) Denman George  History of Present Illness: Shannon Scott is a 75 y.o. female with past history significant for CAD, bilateral carotid artery disease, dyslipidemia, hypertension, hypothyroidism who underwent a total hysterectomy in August of this year for endometrial cancer.   Subsequent CT scans performed for left lower quadrant abdominal pain demonstrated a residual left lower abdominal quadrant/pelvic fluid collection for which the patient underwent a technically successful CT-guided aspiration on 05/03/2016. She returns to interventional radiology clinic for postprocedural evaluation and management. She is accompanied unaccompanied and serves as his own historian.  Since undergoing the CT-guided aspiration, patient reports complete resolution of her preprocedural abdominal and pelvic discomfort. Additionally, she reports improvement in her preprocedural constipation. She is currently without complaint and overall pleased with the procedure.   Past Medical History:  Diagnosis Date  . Arthritis   . Asthma    allergy induced  . Bilateral carotid artery disease (HCC)    L carotid bruit  . CAD (coronary artery disease)   . Cancer ([place])   . Constipation   . Dyslipidemia    intolerant to statins, welchol, niacin, zetia  . History of blood transfusion   . History of nuclear stress test 04/24/2012   lexiscan; normal study  . Hypertension   . Hypothyroidism   . Postoperative nausea and vomiting 01/02/2016    Past Surgical History:  Procedure Laterality Date  . ABDOMINAL HYSTERECTOMY    . Carotid Doppler  02/2012   40-59% right int carotid artery stenosis; 60-79% L int carotid stenosis; L carotid bruit  . CORONARY ARTERY BYPASS GRAFT  03/12/2004   LIMA to LAD, SVG to  circumflex, SVG to PDA  . ROBOTIC ASSISTED TOTAL HYSTERECTOMY WITH BILATERAL SALPINGO OOPHERECTOMY Bilateral 12/30/2015   Procedure: XI ROBOTIC ASSISTED TOTAL HYSTERECTOMY WITH BILATERAL SALPINGO OOPHORECTOMY WITH SENTAL LYMPH NODE BIOPSY;  Surgeon: Nancy Marus, MD;  Location: [facility];  Service: Gynecology;  Laterality: Bilateral;  . TONSILLECTOMY    . TRANSTHORACIC ECHOCARDIOGRAM  04/2007   EF>55%; mild MR; mild-mod TR; mild pulm HTN; mild calcification of aortiv valve leaflets with mild valvular aortic stenosis  . TUBAL LIGATION      Allergies: Statins and Ciprofloxacin  Medications: Prior to Admission medications   Medication Sig Start Date End Date Taking? Authorizing Provider  aspirin EC 81 MG tablet Take 81 mg by mouth daily.   Yes Historical Provider, MD  docusate sodium (COLACE) 100 MG capsule Take 100 mg by mouth daily.   Yes Historical Provider, MD  ibuprofen (ADVIL) 200 MG tablet Take 200 mg by mouth every 6 (six) hours as needed for pain. Reported on 12/17/2015   Yes Historical Provider, MD  irbesartan (AVAPRO) 150 MG tablet Take 1 tablet (150 mg total) by mouth daily. 04/15/16  Yes Luke K Kilroy, PA-C  levothyroxine (SYNTHROID, LEVOTHROID) 50 MCG tablet Take 50 mcg by mouth daily.    Yes Historical Provider, MD  loratadine (CLARITIN) 10 MG tablet Take 10 mg by mouth daily as needed for allergies. Reported on 12/17/2015   Yes Historical Provider, MD  metoprolol succinate (TOPROL-XL) 50 MG 24 hr tablet Take 50 mg by mouth daily. 03/05/16  Yes Historical Provider, MD     Family History  Problem Relation Age of Onset  . Hypertension Mother   .  Heart disease Mother   . Stroke Father   . Heart disease Father   . Kidney disease Brother   . Heart disease Brother     also HTN, hyperlipidemia  . Heart attack Brother   . Stroke Sister     x2  . Hypertension Sister   . Heart disease Sister     Social History   Social History  . Marital status: Widowed    Spouse name: N/A  .  Number of children: 2  . Years of education: N/A   Occupational History  .  Retired   Social History Main Topics  . Smoking status: Never Smoker  . Smokeless tobacco: Never Used  . Alcohol use No  . Drug use: No  . Sexual activity: Not Asked   Other Topics Concern  . None   Social History Narrative  . None    ECOG Status: 0 - Asymptomatic  Review of Systems: A 12 point ROS discussed and pertinent positives are indicated in the HPI above.  All other systems are negative.  Review of Systems  Vital Signs: BP (!) 176/83 (BP Location: Right Arm, Patient Position: Sitting, Cuff Size: Normal)   Pulse 61   Temp 98 F (36.7 C)   Resp 16   SpO2 99%   Physical Exam   Imaging:  CT abdomen and pelvis - 04/15/2016.  CT-guided left lower abdominal quadrant/pelvic fluid collection aspiration - 05/03/2016   Selected images from both of these examinations were reviewed in detail with the patient.   Ct Image Guided Drainage Percut Cath  Peritoneal Retroperit  Result Date: 05/03/2016 INDICATION: History of endometrial cancer, post hysterectomy and August of this year with persistent symptomatic indeterminate fluid collection with the left hemipelvis. Please refer to formal epic consultation performed 04/29/2016 for additional details. Please perform CT-guided aspiration for diagnostic and therapeutic purposes. EXAM: CT GUIDANCE NEEDLE PLACEMENT COMPARISON:  None. MEDICATIONS: The patient is currently admitted to the hospital and receiving intravenous antibiotics. The antibiotics were administered within an appropriate time frame prior to the initiation of the procedure. ANESTHESIA/SEDATION: Moderate (conscious) sedation was employed during this procedure. A total of Versed 1 mg and Fentanyl 50 mcg was administered intravenously. Moderate Sedation Time: 12 minutes. The patient's level of consciousness and vital signs were monitored continuously by radiology nursing throughout the  procedure under my direct supervision. CONTRAST:  None COMPLICATIONS: None immediate. PROCEDURE: Informed written consent was obtained from the patient after a discussion of the risks, benefits and alternatives to treatment. The patient was placed supine on the CT gantry and a pre procedural CT was performed re-demonstrating the known abscess/fluid collection within the left hemipelvis which was noted to be slightly decreased in size compared to the 11/16 examination, currently measuring 1.3 x 3.1 cm (image 25, series 2). Note is also made of a tiny amount of low density fluid (likely fat) within lateral aspect of the fluid collection (image 24, series 2) suggestive of a seroma. The procedure was planned. A timeout was performed prior to the initiation of the procedure. The skin overlying the left lower abdomen/pelvis was prepped and draped in the usual sterile fashion. The overlying soft tissues were anesthetized with 1% lidocaine with epinephrine. Appropriate trajectory was planned with the use of a 22 gauge spinal needle. An 18 gauge trocar needle was advanced into the abscess/fluid collection and a short Amplatz super stiff wire was coiled within the collection. Appropriate positioning was confirmed with a limited CT scan. The trocar needle was  exchanged for a a Yueh sheath catheter an approximately 15 cc of serous, slightly cloudy/milky fluid was aspirated from the collection. Postprocedural imaging demonstrated significant reduction in the fluid collection with residual ill-defined component measuring approximately 1.7 x 2.4 cm (image 19, series 4). A dressing was placed. The patient tolerated the procedure well without immediate post procedural complication. IMPRESSION: Successful CT guided aspiration of approximately 15 cc of serous, slightly cloudy / milky fluid from the residual collection within the left hemipelvis. All aspirated samples were sent to the laboratory for cytologic and gram stain analysis.  Electronically Signed   By: Sandi Mariscal M.D.   On: 05/03/2016 12:05    Labs:  CBC:  Recent Labs  12/26/15 1200 12/31/15 0442 01/01/16 0605 05/03/16 0737  WBC 6.4 9.9 10.7* 6.1  HGB 12.6 11.4* 11.3* 11.5*  HCT 38.4 34.2* 32.3* 35.3*  PLT 273 245 220 328    COAGS:  Recent Labs  05/03/16 0737  INR 1.05  APTT 33    BMP:  Recent Labs  12/31/15 0442 01/01/16 0605 01/01/16 1348 01/02/16 0526  NA 134* 124* 129* 138  K 3.3* 2.9* 2.8* 3.5  CL 100* 88* 94* 104  CO2 28 27 30 28   GLUCOSE 127* 109* 106* 93  BUN 7 10 7 6   CALCIUM 8.8* 8.6* 8.0* 8.4*  CREATININE 0.84 0.60 0.69 0.74  GFRNONAA >60 >60 >60 >60  GFRAA >60 >60 >60 >60    LIVER FUNCTION TESTS:  Recent Labs  12/26/15 1200  BILITOT 0.7  AST 15  ALT 11*  ALKPHOS 100  PROT 8.0  ALBUMIN 4.4   Assessment and Plan:  Shannon Scott is a 75 y.o. female who underwent a total hysterectomy in August of this year for endometrial cancer with subsequent CT scans performed for left lower quadrant abdominal pain demonstrated a residual left lower abdominal quadrant/pelvic fluid collection for which the patient underwent a technically successful CT-guided aspiration on 05/03/2016.   Selected images from procedural CT scan of the abdomen and pelvis performed 11/6S 2017 as well as CT-guided left lower quadrant last pelvic fluid collection aspiration performed 05/03/2016 reviewed with the patient.  I am also pleased to report the aspirated samples demonstrated findings compatible with a benign seroma.  Since undergoing the CT-guided aspiration, patient reports complete resolution of her preprocedural abdominal and pelvic discomfort. Additionally, she reports improvement in her preprocedural constipation. She is currently without complaint and overall pleased with the procedure.  The patient was encouraged to call the interventional radiology clinic with any future questions or concerns though otherwise may return on a PRN  basis.   A copy of this report was sent to the requesting provider on this date.  Electronically Signed: Sandi Mariscal 05/12/2016, 9:23 AM   I spent a total of 10 Minutes in face to face in clinical consultation, greater than 50% of which was counseling/coordinating care for Postoperative seroma

## 2016-05-19 ENCOUNTER — Encounter: Payer: Self-pay | Admitting: Interventional Radiology

## 2016-06-04 ENCOUNTER — Encounter: Payer: Self-pay | Admitting: Interventional Radiology

## 2016-06-16 ENCOUNTER — Ambulatory Visit: Payer: Medicare Other

## 2016-07-20 ENCOUNTER — Ambulatory Visit: Payer: Medicare Other | Admitting: Internal Medicine

## 2016-07-27 NOTE — Progress Notes (Signed)
Consult Note: Gyn-Onc  Consult was requested by Dr. Benjie Karvonen for the evaluation of Shannon Scott 76 y.o. female  CC:  Chief Complaint  Patient presents with  . Endometrial Cancer    Assessment/Plan:  Shannon Scott  is a 76 y.o.  year old with Stage IA grade 2 endometrioid adenocarcinoma Status post definitive treatment 12/30/2015. She only had 30% depth of myometrial invasion, no lymphovascular space involvement, and low risk factors for recurrence therefore adjuvant therapy not recommended in accordance to NCCN guidelines.   She will follow-up with Dr. Benjie Karvonen in 6 months. She will need a Pap smear at that time and she'll return to see me in one year.   HPI: Shannon Scott is a 76 year old G2P2 who was seen in consultation at the request of Dr Benjie Karvonen for grade 1-2 endometrial cancer.  The patient began noticing postmenopausal bleeding in June, 2017. She saw her primary care physician who determined there was no blood in the urine, and she was then referred to Dr Benjie Karvonen who, on 11/21/15 performed a TVUS which showed a uterus measuring 6x2.7x3.3cm with normal ovaries. There was a thickened endometrium of 6.71m. A pipelle endometrial biopsy was performed on 11/21/15 which showed FIGO grade 1-2 endometrial cancer.  The pap from the same date was normal.  The patient has a history of familial hypercholesterolemia. She has a history of a 3 vessel CABG in 2005. She has never had a CVA, and recent carotid dopplers show 50% occlusion. She reports symptoms concerning for LE claudication with ambulation and stairs. She is not a smoker.  On 12/30/15 she underwent robotic assisted total hysterectomy, BSO, pelvic and PA SLN biopsy. 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  Postoperative Diagnosis: same.  Surgery: Total robotic hysterectomy bilateral salpingo-oophorectomy, left pelvic, right para-aortic and bifurcation SLN removal  Operative findings:  1) Colon  adherent to posterior uterus, left sidewall and appendix 2) 2 right para-aortic nodes for SLN (high and PA node) 3) Aortic bifurcation nodes 4) Left obturator node  Diagnosis 1. Lymph node, sentinel, biopsy, right obturator - ONE BENIGN LYMPH NODE (0/1). 2. Lymph node, sentinel, biopsy, right peri-aortic - ONE BENIGN LYMPH NODE (0/1). 3. Lymph node, sentinel, biopsy, right lower peri-aortic - ONE BENIGN LYMPH NODE (0/1). 4. Lymph node, sentinel, biopsy, aortic bifurcation - ONE BENIGN LYMPH NODE (0/1). 5. Lymph node, sentinel, biopsy, left obturator - ONE BENIGN LYMPH NODE (0/1). 6. Uterus +/- tubes/ovaries, neoplastic - ENDOMETRIAL ADENOCARCINOMA, 1.7 CM WITH SUPERFICIAL MYOMETRIAL INVASION. - MARGINS NOT INVOLVED. - CERVIX, BILATERAL OVARIES AND BILATERAL FALLOPIAN TUBES FREE OF TUMOR. Microscopic Comment 6. ONCOLOGY TABLE-UTERUS, CARCINOMA OR CARCINOSARCOMA Specimen: Uterus with bilateral fallopian tubes and ovaries and sentinel lymph node biopsies. Procedure: Hysterectomy with sentinel lymph nodes. Lymph node sampling performed: Yes. Specimen integrity: Intact. Maximum tumor size: 1.7 cm Histologic type: Endometrioid with squamous differentiation. Grade: 2 Myometrial invasion: 0.3 cm where myometrium is 1 cm in thickness Cervical stromal involvement: No. Extent of involvement of other organs: None, identified. Lymph - vascular invasion: Not identified.  Interval History: 05/03/16 CT guided drainage: IMPRESSION: Successful CT guided aspiration of approximately 15 cc of serous, slightly cloudy / milky fluid from the residual collection within the left hemipelvis. All aspirated samples were sent to the laboratory for cytologic and gram stain analysis.  Cytology was negative.  She's overall doing fairly well. She does have some occasional minimal pain on the left lower quadrant as stool was passing through the colon and  she wasn't sure it was related to the small fluid  collection that was drained previously are not. Her stools are soft. She does not have the pain L all the time nor she had the pain with every bowel movement. She's eating well. She denies any blood in her stool. She does have a little bit of sinus pressure just secondary to the pollen. She had a cold with a cough week ago but that is resolved. The biggest change in her life is that her older brother who is going to be 75 this year passed away most likely from a GI bleed. She just taken him to the emergency room and had a cardiac arrest. Her blood pressure was quite high today. She states that it is always high at the doctor's offices.  Review of Systems  Constitutional:  Denies fever. Skin: No rash Cardiovascular: No chest pain, shortness of breath, or edema  Pulmonary: No cough  Gastro Intestinal: Reporting intermittent lower abdominal soreness as above with BM.  No nausea, vomiting, constipation, or diarrhea reported. No bright red blood per rectum. Genitourinary: Denies vaginal bleeding and discharge.  Musculoskeletal: No joint pain.  Neurologic: No weakness Psychology: Obviously disappointed that her brother passed away. She would've been celebrating her 76th birthday this year and he would've celebrated his 85th and they were going to have a party together. He has 3 other remaining siblings out of a total of 14. She was the youngest  Current Meds:  Outpatient Encounter Prescriptions as of 07/28/2016  Medication Sig  . aspirin EC 81 MG tablet Take 81 mg by mouth daily.  Marland Kitchen docusate sodium (COLACE) 100 MG capsule Take 100 mg by mouth daily.  Marland Kitchen ibuprofen (ADVIL) 200 MG tablet Take 200 mg by mouth every 6 (six) hours as needed for pain. Reported on 12/17/2015  . irbesartan (AVAPRO) 150 MG tablet Take 1 tablet (150 mg total) by mouth daily.  Marland Kitchen levothyroxine (SYNTHROID, LEVOTHROID) 50 MCG tablet Take 50 mcg by mouth daily.   Marland Kitchen loratadine (CLARITIN) 10 MG tablet Take 10 mg by mouth daily as needed  for allergies. Reported on 12/17/2015  . metoprolol succinate (TOPROL-XL) 50 MG 24 hr tablet Take 50 mg by mouth daily.   No facility-administered encounter medications on file as of 07/28/2016.     Allergy:  Allergies  Allergen Reactions  . Statins Other (See Comments)    Myalgias and memory problems  . Ciprofloxacin Itching    Splotchy redness with itching during IV infusion localized to arm.    Social Hx:   Social History   Social History  . Marital status: Widowed    Spouse name: N/A  . Number of children: 2  . Years of education: N/A   Occupational History  .  Retired   Social History Main Topics  . Smoking status: Never Smoker  . Smokeless tobacco: Never Used  . Alcohol use No  . Drug use: No  . Sexual activity: Not on file   Other Topics Concern  . Not on file   Social History Narrative  . No narrative on file    Past Surgical Hx:  Past Surgical History:  Procedure Laterality Date  . ABDOMINAL HYSTERECTOMY    . Carotid Doppler  02/2012   40-59% right int carotid artery stenosis; 60-79% L int carotid stenosis; L carotid bruit  . CORONARY ARTERY BYPASS GRAFT  03/12/2004   LIMA to LAD, SVG to circumflex, SVG to PDA  . IR GENERIC HISTORICAL  04/29/2016  IR RADIOLOGIST EVAL & MGMT 04/29/2016 Sandi Mariscal, MD [facility] INTER[facility] RAD  . IR GENERIC HISTORICAL  05/12/2016   IR RADIOLOGIST EVAL & MGMT 05/12/2016 Sandi Mariscal, MD [facility]  . ROBOTIC ASSISTED TOTAL HYSTERECTOMY WITH BILATERAL SALPINGO OOPHERECTOMY Bilateral 12/30/2015   Procedure: XI ROBOTIC ASSISTED TOTAL HYSTERECTOMY WITH BILATERAL SALPINGO OOPHORECTOMY WITH SENTAL LYMPH NODE BIOPSY;  Surgeon: Nancy Marus, MD;  Location: [facility];  Service: Gynecology;  Laterality: Bilateral;  . TONSILLECTOMY    . TRANSTHORACIC ECHOCARDIOGRAM  04/2007   EF>55%; mild MR; mild-mod TR; mild pulm HTN; mild calcification of aortiv valve leaflets with mild valvular aortic stenosis  . TUBAL LIGATION      Past Medical Hx:   Past Medical History:  Diagnosis Date  . Arthritis   . Asthma    allergy induced  . Bilateral carotid artery disease (HCC)    L carotid bruit  . CAD (coronary artery disease)   . Cancer ([place])   . Constipation   . Dyslipidemia    intolerant to statins, welchol, niacin, zetia  . History of blood transfusion   . History of nuclear stress test 04/24/2012   lexiscan; normal study  . Hypertension   . Hypothyroidism   . Postoperative nausea and vomiting 01/02/2016    Past Gynecological History:  See HPI  No LMP recorded. Patient has had a hysterectomy.  Family Hx:  Family History  Problem Relation Age of Onset  . Hypertension Mother   . Heart disease Mother   . Stroke Father   . Heart disease Father   . Kidney disease Brother   . Heart disease Brother     also HTN, hyperlipidemia  . Heart attack Brother   . Stroke Sister     x2  . Hypertension Sister   . Heart disease Sister     Vitals:  Blood pressure (!) 209/62, pulse 64, temperature 98 F (36.7 C), temperature source Oral, resp. rate 18, height 5' 1"  (1.549 m), weight 143 lb 14.4 oz (65.3 kg), SpO2 100 %. BMI 28kg/m2  Physical Exam: WD in NAD  Neck: No lymphadenopathy, no thyromegaly.  Lungs: Clear to auscultation bilaterally.  Cardiac: Regular rate and rhythm with a 2/6 systolic ejection murmur  Abdomen: Well-healing surgical incisions. Abdomen is soft, nontender, nondistended. There is no evidence of an incisional hernias.  Groins: No lymphadenopathy.  Extremities: Bilateral trace edema to the ankles.  Genito Urinary: External genitalia within normal limits. Vagina is markedly atrophic. The vaginal cuff is visualized. There are no visible lesions. There is no bleeding. There is no discharge.. Bimanual examination reveals no fluctuance or masses. Rectal confirms  Celine Dishman A., MD  07/28/2016, 1:46 PM

## 2016-07-28 ENCOUNTER — Ambulatory Visit: Payer: Medicare Other | Attending: Gynecologic Oncology | Admitting: Gynecologic Oncology

## 2016-07-28 ENCOUNTER — Encounter: Payer: Self-pay | Admitting: Gynecologic Oncology

## 2016-07-28 VITALS — BP 209/62 | HR 64 | Temp 98.0°F | Resp 18 | Ht 61.0 in | Wt 143.9 lb

## 2016-07-28 DIAGNOSIS — I10 Essential (primary) hypertension: Secondary | ICD-10-CM | POA: Insufficient documentation

## 2016-07-28 DIAGNOSIS — Z888 Allergy status to other drugs, medicaments and biological substances status: Secondary | ICD-10-CM | POA: Diagnosis not present

## 2016-07-28 DIAGNOSIS — Z8249 Family history of ischemic heart disease and other diseases of the circulatory system: Secondary | ICD-10-CM | POA: Diagnosis not present

## 2016-07-28 DIAGNOSIS — Z951 Presence of aortocoronary bypass graft: Secondary | ICD-10-CM | POA: Diagnosis not present

## 2016-07-28 DIAGNOSIS — Z823 Family history of stroke: Secondary | ICD-10-CM | POA: Diagnosis not present

## 2016-07-28 DIAGNOSIS — Z79899 Other long term (current) drug therapy: Secondary | ICD-10-CM | POA: Insufficient documentation

## 2016-07-28 DIAGNOSIS — Z8542 Personal history of malignant neoplasm of other parts of uterus: Secondary | ICD-10-CM

## 2016-07-28 DIAGNOSIS — Z881 Allergy status to other antibiotic agents status: Secondary | ICD-10-CM | POA: Diagnosis not present

## 2016-07-28 DIAGNOSIS — I2510 Atherosclerotic heart disease of native coronary artery without angina pectoris: Secondary | ICD-10-CM | POA: Insufficient documentation

## 2016-07-28 DIAGNOSIS — E039 Hypothyroidism, unspecified: Secondary | ICD-10-CM | POA: Insufficient documentation

## 2016-07-28 DIAGNOSIS — C541 Malignant neoplasm of endometrium: Secondary | ICD-10-CM | POA: Diagnosis not present

## 2016-07-28 DIAGNOSIS — Z90710 Acquired absence of both cervix and uterus: Secondary | ICD-10-CM | POA: Diagnosis not present

## 2016-07-28 DIAGNOSIS — Z90722 Acquired absence of ovaries, bilateral: Secondary | ICD-10-CM | POA: Insufficient documentation

## 2016-07-28 DIAGNOSIS — Z7982 Long term (current) use of aspirin: Secondary | ICD-10-CM | POA: Insufficient documentation

## 2016-07-28 DIAGNOSIS — Z841 Family history of disorders of kidney and ureter: Secondary | ICD-10-CM | POA: Insufficient documentation

## 2016-07-28 DIAGNOSIS — E7801 Familial hypercholesterolemia: Secondary | ICD-10-CM | POA: Diagnosis not present

## 2016-07-28 NOTE — Patient Instructions (Signed)
Follow up with Dr. Benjie Karvonen in 6 months and Dr. Alycia Rossetti in one year.

## 2016-08-02 ENCOUNTER — Encounter: Payer: Self-pay | Admitting: Internal Medicine

## 2016-08-02 ENCOUNTER — Ambulatory Visit (INDEPENDENT_AMBULATORY_CARE_PROVIDER_SITE_OTHER): Payer: Medicare Other | Admitting: Pharmacist

## 2016-08-02 ENCOUNTER — Ambulatory Visit (INDEPENDENT_AMBULATORY_CARE_PROVIDER_SITE_OTHER): Payer: Medicare Other | Admitting: Internal Medicine

## 2016-08-02 VITALS — BP 192/88 | HR 60 | Ht 61.0 in | Wt 145.0 lb

## 2016-08-02 DIAGNOSIS — E7801 Familial hypercholesterolemia: Secondary | ICD-10-CM

## 2016-08-02 DIAGNOSIS — I10 Essential (primary) hypertension: Secondary | ICD-10-CM | POA: Diagnosis not present

## 2016-08-02 DIAGNOSIS — Z951 Presence of aortocoronary bypass graft: Secondary | ICD-10-CM | POA: Diagnosis not present

## 2016-08-02 DIAGNOSIS — I6529 Occlusion and stenosis of unspecified carotid artery: Secondary | ICD-10-CM

## 2016-08-02 DIAGNOSIS — I350 Nonrheumatic aortic (valve) stenosis: Secondary | ICD-10-CM

## 2016-08-02 MED ORDER — IRBESARTAN 300 MG PO TABS: 300.0000 mg | ORAL_TABLET | Freq: Every day | ORAL | 5 refills | Status: DC

## 2016-08-02 NOTE — Progress Notes (Signed)
Patient ID: JAHYA SCHNETTLER                 DOB: June 07, 1940                    MRN: [id number]     HPI:  Shannon Scott is a 76 y.o. female patient referred to lipid clinic by Dr Debara Pickett.  Prior medical history includes CAD- s/p CABG '05, TIA, carotid stenosis, dyslipidemia, and hypertension.  Intolerant to multiple medication including statins, WelChol, Niacin and ezetimibe 10mg .  Baseline LDL 333 on June/2016 giving a [place] Diagnostic Criteria of 8 (Probable Familial Hypercholesterolemia).  Patient presents for pharmacist clinic for PCSK9i initiation.  Current Medications: none  Intolerances:  Rosuvastatin 10mg  daily (myalgia, arthralgias and memory problems) Rosuvastatin 5mg  daily Rosuvastatin 5mg  every other day (myalgia and arthalgias) Pravastatin 10mg  daily (myalgias) Welchol 7 tablets daily Niacin Ezetimibe 10mg    Risk Factors: hypertension, CAD, hyperlipidemia  LDL goal: <70  Diet: decreasing carbohydrates, fat and fast food from diet.   Exercise: Recently stopped walking ~3 miles per day.  Patient trying to resume daily walks.  Family History:Mother - Hypertension and heart disease; Father - stroke and heart disease; brother and sister with history of HTN, MI , stroke and CAD.  Social History: Denies smoking, smokeless tobacco, alcohol or illicit drugs  Labs:  CHO 401; HDL 50; TG 91; LDL 333 (10/2014)   Past Medical History:  Diagnosis Date  . Arthritis   . Asthma    allergy induced  . Bilateral carotid artery disease (HCC)    L carotid bruit  . CAD (coronary artery disease)   . Cancer ([place])   . Constipation   . Dyslipidemia    intolerant to statins, welchol, niacin, zetia  . History of blood transfusion   . History of nuclear stress test 04/24/2012   lexiscan; normal study  . Hypertension   . Hypothyroidism   . Postoperative nausea and vomiting 01/02/2016    Current Outpatient Prescriptions on File Prior to Visit  Medication Sig Dispense Refill  . aspirin  EC 81 MG tablet Take 81 mg by mouth daily.    Marland Kitchen docusate sodium (COLACE) 100 MG capsule Take 100 mg by mouth daily.    Marland Kitchen ibuprofen (ADVIL) 200 MG tablet Take 200 mg by mouth every 6 (six) hours as needed for pain. Reported on 12/17/2015    . irbesartan (AVAPRO) 300 MG tablet Take 1 tablet (300 mg total) by mouth daily. 30 tablet 5  . levothyroxine (SYNTHROID, LEVOTHROID) 50 MCG tablet Take 50 mcg by mouth daily.     Marland Kitchen loratadine (CLARITIN) 10 MG tablet Take 10 mg by mouth daily as needed for allergies. Reported on 12/17/2015    . metoprolol succinate (TOPROL-XL) 50 MG 24 hr tablet Take 50 mg by mouth daily.  0   No current facility-administered medications on file prior to visit.     Allergies  Allergen Reactions  . Statins Other (See Comments)    Myalgias and memory problems  . Ciprofloxacin Itching    Splotchy redness with itching during IV infusion localized to arm.     Familial Hyperlipidemia:  Patient with [place] Criteria of 8 (Probable Familial Hyperlipidemia) and history of CAD.  Also noted intolerance to ALL previous therapy including multiple statins (high intensity, low intensity and low dose), ezetimibe, and Welchol.  Received information counseling about PCSK9i including insurance pre-authorization process, storage, administration, common ADRs, and patient assistant program.  Will initiate paperwork for  Repatha 420mg  infusion and provide patient updates by phone  Cristianna Cyr Rodriguez-Guzman PharmD, [place] [id number] 08/02/2016 4:35 PM

## 2016-08-02 NOTE — Patient Instructions (Addendum)
Your physician has recommended you make the following change in your medication: INCREASE irbesartan to 300mg  daily  Your physician recommends that you return for lab work FASTING ([place]) & in 3 months  Your physician recommends that you schedule a follow-up appointment with clinical pharmacist for LIPID + Hypertension CLINIC appointment to discuss Repatha + medication management in 3-4 weeks  Your physician wants you to follow-up in: [place] with Dr. Debara Pickett. You will receive a reminder letter in the mail two months in advance. If you don't receive a letter, please call our office to schedule the follow-up appointment.

## 2016-08-03 LAB — COMPREHENSIVE METABOLIC PANEL
ALT: 5 U/L — ABNORMAL LOW (ref 6–29)
AST: 14 U/L (ref 10–35)
Albumin: 4.2 g/dL (ref 3.6–5.1)
Alkaline Phosphatase: 99 U/L (ref 33–130)
BUN: 9 mg/dL (ref 7–25)
CO2: 30 mmol/L (ref 20–31)
Calcium: 10.1 mg/dL (ref 8.6–10.4)
Chloride: 103 mmol/L (ref 98–110)
Creat: 0.69 mg/dL (ref 0.60–0.93)
Glucose, Bld: 100 mg/dL — ABNORMAL HIGH (ref 65–99)
Potassium: 4.3 mmol/L (ref 3.5–5.3)
Sodium: 140 mmol/L (ref 135–146)
Total Bilirubin: 0.4 mg/dL (ref 0.2–1.2)
Total Protein: 7.8 g/dL (ref 6.1–8.1)

## 2016-08-03 NOTE — Progress Notes (Signed)
OFFICE NOTE  Chief Complaint:  No complaints  Primary Care Physician: [name], MD  HPI:  MIRABELLE CYPHERS is a 76 year old female with severe combined dyslipidemia with CABG in 2005 who has been intolerant to statins, Welchol, niacin, Zetia, and other cholesterol-lower medications. In the past I had sent her to Dr. Moshe Cipro at [facility], who is a lipidologist, for evaluation for apheresis. However, due to the cost of close to $1000 a month she felt that was intolerable. Recently she was complaining of neck pain, underwent carotid Dopplers that you ordered, and those demonstrated 40% to 59% right internal carotid stenosis and a 60% to 79% left internal carotid stenosis. She does have a left carotid bruit. In addition, she is describing a band-like pressure around her chest which she attributed to her blood pressure medicine and then had actually discontinued that; however, the symptoms still persist. This is a little bit worse with exertion, does relieve with rest or taking blood pressure medicine, and is concerning for angina. She's also complaining of some left-sided numbness facial tingling which could be TIA. She has not been taking aspirin and she reports developing what sounds like petechiae on that, but is also not willing to take Plavix or other antiplatelet medications. She does also report pain center called for her body and her shoulders and her knees and her legs as well.  Mrs. Smay returns today for follow-up. It's been about a year and a half since I seen her. She does have a history of multivessel coronary disease and prior bypass in 2005. Her last stress test was in 2013 which was negative for ischemia. An echo in 2008 showed probable mild aortic stenosis. She does have a murmur of aortic stenosis today which may be a little louder. Blood pressure is also poorly controlled today however she said she did not take her medicine. She says that she is taking only a half of  her herbs certain pill as it causes her more swelling if she takes the whole pill, despite the fact that contains a diuretic. She's been very intolerant to almost all medications including all of the statins and Zetia. No medicines of been tolerated and she continues to have a very high cholesterol over 300. I do think she be a good candidate for a PC SK 9 inhibitor.  Mrs. Mccarrick returns today for follow-up. She is recently started on BiDil and report some mild improvement in blood pressure although she feels when her blood pressure gets low that she is symptomatic. She may have some degree of lack of cerebral autoregulation due to long-standing hypertension. Unfortunate she's been intolerant to all medications. She's been tried on all of the statin medications, WelChol, niacin and steady and has been intolerant. I referred her to [facility] in 2013 for apheresis, however due to the very high cost this was never pursued. With the recent approval of PC SK 9 inhibitors, I think this would be a very good option for her. She has known ASCVD and FH based on her lipid profile. Her most recent cholesterol demonstrated total cholesterol 401, triglycerides 91, HDL 50, and LDL 333. Based on these findings her Dutch score is 8.  08/02/2016  Mrs. Steger returns today for follow-up. Although she is asymptomatic, she still has significant untreated risk factors. Most notably she likely has heterozygous FH. Her brother died recently suddenly, which is suspicious for cardiovascular disease. She does have 2 children, however who apparently do not have any significant cholesterol abnormalities.  She does also have a parent to had premature coronary artery disease. Her most recent LDL cholesterol was over 300 but has not been checked in a number of years. She's been intolerant to all statins, Zetia and WelChol. She previously been referred for lipoprotein a pheresis however did not make that appointment due to the cost.  PMHx:    Past Medical History:  Diagnosis Date  . Arthritis   . Asthma    allergy induced  . Bilateral carotid artery disease (HCC)    L carotid bruit  . CAD (coronary artery disease)   . Cancer ([place])   . Constipation   . Dyslipidemia    intolerant to statins, welchol, niacin, zetia  . History of blood transfusion   . History of nuclear stress test 04/24/2012   lexiscan; normal study  . Hypertension   . Hypothyroidism   . Postoperative nausea and vomiting 01/02/2016    Past Surgical History:  Procedure Laterality Date  . ABDOMINAL HYSTERECTOMY    . Carotid Doppler  02/2012   40-59% right int carotid artery stenosis; 60-79% L int carotid stenosis; L carotid bruit  . CORONARY ARTERY BYPASS GRAFT  03/12/2004   LIMA to LAD, SVG to circumflex, SVG to PDA  . IR GENERIC HISTORICAL  04/29/2016   IR RADIOLOGIST EVAL & MGMT 04/29/2016 Sandi Mariscal, MD [facility] INTERV RAD  . IR GENERIC HISTORICAL  05/12/2016   IR RADIOLOGIST EVAL & MGMT 05/12/2016 Sandi Mariscal, MD [facility] INTERV RAD  . ROBOTIC ASSISTED TOTAL HYSTERECTOMY WITH BILATERAL SALPINGO OOPHERECTOMY Bilateral 12/30/2015   Procedure: XI ROBOTIC ASSISTED TOTAL HYSTERECTOMY WITH BILATERAL SALPINGO OOPHORECTOMY WITH SENTAL LYMPH NODE BIOPSY;  Surgeon: Nancy Marus, MD;  Location: [facility] ORS;  Service: Gynecology;  Laterality: Bilateral;  . TONSILLECTOMY    . TRANSTHORACIC ECHOCARDIOGRAM  04/2007   EF>55%; mild MR; mild-mod TR; mild pulm HTN; mild calcification of aortiv valve leaflets with mild valvular aortic stenosis  . TUBAL LIGATION      FAMHx:  Family History  Problem Relation Age of Onset  . Hypertension Mother   . Heart disease Mother   . Stroke Father   . Heart disease Father   . Kidney disease Brother   . Heart disease Brother     also HTN, hyperlipidemia  . Heart attack Brother   . Stroke Sister     x2  . Hypertension Sister   . Heart disease Sister     SOCHx:   reports that she has never smoked. She has never used smokeless  tobacco. She reports that she does not drink alcohol or use drugs.  ALLERGIES:  Allergies  Allergen Reactions  . Statins Other (See Comments)    Myalgias and memory problems  . Ciprofloxacin Itching    Splotchy redness with itching during IV infusion localized to arm.    ROS: Pertinent items noted in HPI and remainder of comprehensive ROS otherwise negative.  HOME MEDS: Current Outpatient Prescriptions  Medication Sig Dispense Refill  . aspirin EC 81 MG tablet Take 81 mg by mouth daily.    Marland Kitchen docusate sodium (COLACE) 100 MG capsule Take 100 mg by mouth daily.    Marland Kitchen ibuprofen (ADVIL) 200 MG tablet Take 200 mg by mouth every 6 (six) hours as needed for pain. Reported on 12/17/2015    . irbesartan (AVAPRO) 300 MG tablet Take 1 tablet (300 mg total) by mouth daily. 30 tablet 5  . levothyroxine (SYNTHROID, LEVOTHROID) 50 MCG tablet Take 50 mcg by mouth daily.     Marland Kitchen  loratadine (CLARITIN) 10 MG tablet Take 10 mg by mouth daily as needed for allergies. Reported on 12/17/2015    . metoprolol succinate (TOPROL-XL) 50 MG 24 hr tablet Take 50 mg by mouth daily.  0   No current facility-administered medications for this visit.     LABS/IMAGING: No results found for this or any previous visit (from the past 48 hour(s)). No results found.  VITALS: BP (!) 192/88   Pulse 60   Ht 5' 1"  (1.549 m)   Wt 145 lb (65.8 kg)   BMI 27.40 kg/m   EXAM: General appearance: alert and no distress Neck: no adenopathy, + left carotid bruit, no JVD, supple, symmetrical, trachea midline and thyroid not enlarged, symmetric, no tenderness/mass/nodules Lungs: clear to auscultation bilaterally Heart: regular rate and rhythm, S1, S2 normal, no murmur, click, rub or gallop Abdomen: soft, non-tender; bowel sounds normal; no masses,  no organomegaly Extremities: extremities normal, atraumatic, no cyanosis or edema Pulses: 2+ and symmetric Skin: Skin color, texture, turgor normal. No rashes or lesions Neurologic:  Grossly normal  EKG: Normal sinus rhythm at 60, possible left atrial enlargement   ASSESSMENT: 1. Heterozygous familial hypercholesterolemia (HeFH) with a [place] score of 8 2. History of TIAs 3. Multidrug intolerance - including statins, welchol, zetia, etc. 3.   CABG x 3 (2005) 4.   Known moderate to severe bilateral carotid artery disease  5.   Uncontrolled hypertension 6.   Aortic stenosis (?LPa elevation)  PLAN: 1.   Ms. Rawlinson continues to struggle with ASCVD including coronary artery disease with prior CABG in 2005, aortic stenosis and PAD. She's been intolerant of statins, WelChol and Zetia and declined lipoprotein a pheresis. We discussed the PC SK 9 inhibitors as a possible treatment alternative. I did discuss the autosomal dominant nature of familial hypercholesterolemia and that she should have her children screened. She reported that her children do have normal cholesterol values. I will make a referral for repack as there is indication for monotherapy. She does seem that she would be willing to try this medication. Finally, blood pressure is elevated again today. We will increase her irbesartan up to 3 are milligrams daily. She'll follow up with pharmacy for [facility] SK 9 training as well as blood pressure rechecked in a few weeks. In addition I'll check an extended lipid profile today to serve as a new baseline, with particular focus on LPa, which could explain her aortic stenosis.  Pixie Casino, MD, [facility] Attending Cardiologist [place] 08/03/2016, 5:43 PM

## 2016-08-06 ENCOUNTER — Telehealth: Payer: Self-pay | Admitting: Internal Medicine

## 2016-08-06 LAB — CARDIO IQ(R) ADVANCED LIPID PANEL
Apolipoprotein B: 175 mg/dL — ABNORMAL HIGH (ref 49–103)
Cholesterol, Total: 333 mg/dL — ABNORMAL HIGH (ref <200)
Cholesterol/HDL Ratio: 5.9 calc — ABNORMAL HIGH (ref <5.0)
HDL Cholesterol: 56 mg/dL (ref >50)
LDL Large: 4711 nmol/L — ABNORMAL LOW (ref 5038–17886)
LDL Medium: 645 nmol/L — ABNORMAL HIGH (ref 121–397)
LDL Particle Number: 2191 nmol/L — ABNORMAL HIGH (ref 1016–2185)
LDL Peak Size: 219.3 Angstrom (ref >=218.2)
LDL Small: 278 nmol/L (ref 115–386)
LDL, Calculated: 257 mg/dL — ABNORMAL HIGH (ref <100)
Lipoprotein (a): 123 nmol/L — ABNORMAL HIGH (ref <75)
Non-HDL Cholesterol: 277 mg/dL (calc) — ABNORMAL HIGH (ref <130)
Triglycerides: 87 mg/dL (ref <150)

## 2016-08-06 NOTE — Telephone Encounter (Signed)
Returned call to patient. Notified her that [employer] takes a while to process so we do not have results. She voiced understanding.   She has questions about Repatha paperwork and would like to speak w/Raquel about this. Message routed to her for follow up

## 2016-08-06 NOTE — Patient Instructions (Signed)
Cholesterol Cholesterol is a fat. Your body needs a small amount of cholesterol. Cholesterol (plaque) may build up in your blood vessels (arteries). That makes you more likely to have a heart attack or stroke. You cannot feel your cholesterol level. Having a blood test is the only way to find out if your level is high. Keep your test results. Work with your doctor to keep your cholesterol at a good level. What do the results mean?  Total cholesterol is how much cholesterol is in your blood.  LDL is bad cholesterol. This is the type that can build up. Try to have low LDL.  HDL is good cholesterol. It cleans your blood vessels and carries LDL away. Try to have high HDL.  Triglycerides are fat that the body can store or burn for energy. What are good levels of cholesterol?  Total cholesterol below 200.  LDL below 100 is good for people who have health risks. LDL below 70 is good for people who have very high risks.  HDL above 40 is good. It is best to have HDL of 60 or higher.  Triglycerides below 150. How can I lower my cholesterol? Diet  Follow your diet program as told by your doctor.  Choose fish, white meat chicken, or turkey that is roasted or baked. Try not to eat red meat, fried foods, sausage, or lunch meats.  Eat lots of fresh fruits and vegetables.  Choose whole grains, beans, pasta, potatoes, and cereals.  Choose olive oil, corn oil, or canola oil. Only use small amounts.  Try not to eat butter, mayonnaise, shortening, or palm kernel oils.  Try not to eat foods with trans fats.  Choose low-fat or nonfat dairy foods.  Drink skim or nonfat milk.  Eat low-fat or nonfat yogurt and cheeses.  Try not to drink whole milk or cream.  Try not to eat ice cream, egg yolks, or full-fat cheeses.  Healthy desserts include angel food cake, ginger snaps, animal crackers, hard candy, popsicles, and low-fat or nonfat frozen yogurt. Try not to eat pastries, cakes, pies, and  cookies. Exercise  Follow your exercise program as told by your doctor.  Be more active. Try gardening, walking, and taking the stairs.  Ask your doctor about ways that you can be more active. Medicine  Take over-the-counter and prescription medicines only as told by your doctor. This information is not intended to replace advice given to you by your health care provider. Make sure you discuss any questions you have with your health care provider. Document Released: 08/13/2008 Document Revised: 12/17/2015 Document Reviewed: 11/27/2015 Elsevier Interactive Patient Education  2017 Elsevier Inc.  

## 2016-08-06 NOTE — Telephone Encounter (Signed)
New Message  Pt voiced wanting there lab results.

## 2016-08-11 NOTE — Telephone Encounter (Signed)
No answer today and unable to leave message.   Pre-approval application for Repatha submitted. Waiting for insurance response.  Will call patient back once more information available.

## 2016-08-19 ENCOUNTER — Telehealth: Payer: Self-pay | Admitting: Pharmacist

## 2016-08-19 MED ORDER — EVOLOCUMAB WITH INFUSOR 420 MG/3.5ML ~~LOC~~ SOCT: 420.0000 mg | SUBCUTANEOUS | 11 refills | Status: DC

## 2016-08-19 NOTE — Telephone Encounter (Signed)
repatha 420mg  infusion was approved by insurance. Rx sent to [facility] pharmacy.   LMOM; patient to call back with co-pay information and bring completed form for  patient assistant to get medication free of charge from [facility]. (Paperwork given to patient during lipid clinic office visit)  Patient to call back @ [phone] or stop by the office with completed form of if questions.

## 2016-08-23 ENCOUNTER — Encounter: Payer: Self-pay | Admitting: Pharmacist Clinician (PhC)/ Clinical Pharmacy Specialist

## 2016-08-23 ENCOUNTER — Ambulatory Visit (INDEPENDENT_AMBULATORY_CARE_PROVIDER_SITE_OTHER): Payer: Medicare Other | Admitting: Pharmacist Clinician (PhC)/ Clinical Pharmacy Specialist

## 2016-08-23 DIAGNOSIS — I10 Essential (primary) hypertension: Secondary | ICD-10-CM | POA: Diagnosis not present

## 2016-08-23 MED ORDER — AMLODIPINE BESYLATE 2.5 MG PO TABS: 2.5000 mg | ORAL_TABLET | Freq: Every day | ORAL | 3 refills | Status: DC

## 2016-08-23 NOTE — Assessment & Plan Note (Signed)
Blood pressure continues to be uncontrolled today.  Will start patient on amlodipine 2.5 mg daily.  She will most likely need the full 10 mg, but she is hesitant to start another medication (would instead prefer to replace irbesartan).  I explained that she would most likely need 2-3 medications to control her pressure, that just switching one out for another would serve no purpose.  Will see her back in a month for follow up and medication titration.

## 2016-08-23 NOTE — Patient Instructions (Addendum)
Return for a a follow up appointment in 3 weeks  Your blood pressure today is 182/72  Check your blood pressure at home daily and keep record of the readings.  Take your BP meds as follows:  AM:  Metoprolol 50 mg, 1/2 of 300 mg irbesartan  PM:  Amlodpine 2.5 mg, 1/2 of 300 mg irbesartan  Bring all of your meds, your BP cuff and your record of home blood pressures to your next appointment.  Exercise as you're able, try to walk approximately 30 minutes per day.  Keep salt intake to a minimum, especially watch canned and prepared boxed foods.  Eat more fresh fruits and vegetables and fewer canned items.  Avoid eating in fast food restaurants.    HOW TO TAKE YOUR BLOOD PRESSURE: . Rest 5 minutes before taking your blood pressure. .  Don't smoke or drink caffeinated beverages for at least 30 minutes before. . Take your blood pressure before (not after) you eat. . Sit comfortably with your back supported and both feet on the floor (don't cross your legs). . Elevate your arm to heart level on a table or a desk. . Use the proper sized cuff. It should fit smoothly and snugly around your bare upper arm. There should be enough room to slip a fingertip under the cuff. The bottom edge of the cuff should be 1 inch above the crease of the elbow. . Ideally, take 3 measurements at one sitting and record the average.

## 2016-08-23 NOTE — Progress Notes (Signed)
08/23/2016 Shannon Scott [birth year] [id number]   HPI:  Shannon Scott is a 76 y.o. female patient of Dr Debara Pickett, with a PMH below who presents today for hypertension clinic evaluation and hyperlipidemia follow up.  She is s/p CABG in 2005, TIA and carotid stenosis.   She saw Raquel Rodriguez-Guzman and started the process to get Repatha Pushtronix.  It was approved by her insurance and called to the pharmacy.  Unfortunately the patient has been reading about side effects on the internet and decided that it would not be safe to take.  She has an LDL cholesterol of 257 with s [name].   She is adamant that the medication is unnecessary.   She complains about her blood pressure medication as well.  Believes the metoprolol is doing well, but feels the irbesartan has been causing her to feel poorly.  It was divided into 150 mg twice daily, which she seems to tolerate better.    Blood Pressure Goal:  130/80  Current Medications:  Irbesartan 300 mg qd - (150 mg bid) - states feels better when misses doses  Metoprolol succ 50 mg qd  Family Hx:  Mother (88) - Hypertension and heart disease; died from pneumonia/PE  Father  (89) - stroke and heart disease;  (started with 13 total siblings - only 2 remain) several siblings with history of HTN, MI , stroke and CAD  Social Hx:  No tobacco or alcohol; does drink sweet tea daily  Diet:  States has lost about 20 pounds over past year; doesn't each much fried foods, prefers grilled; cut back on sweets, cut out ice cream but does eat frozen treats at [employer] regularly   Exercise:  Previously went to gym for walking, plans to get back (had to stop when brother was ill)   Intolerances:    No intolerances to antihypertensive medications.  Wt Readings from Last 3 Encounters:  08/02/16 145 lb (65.8 kg)  07/28/16 143 lb 14.4 oz (65.3 kg)  04/29/16 144 lb (65.3 kg)   BP Readings from Last 3 Encounters:  08/23/16 (!) 182/72    08/02/16 (!) 192/88  07/28/16 (!) 209/62   Pulse Readings from Last 3 Encounters:  08/23/16 (!) 56  08/02/16 60  07/28/16 64    Current Outpatient Prescriptions  Medication Sig Dispense Refill  . amLODipine (NORVASC) 2.5 MG tablet Take 1 tablet (2.5 mg total) by mouth daily. 30 tablet 3  . aspirin EC 81 MG tablet Take 81 mg by mouth daily.    Marland Kitchen docusate sodium (COLACE) 100 MG capsule Take 100 mg by mouth daily.    . Evolocumab with Infusor ([place]) 420 MG/3.5ML SOCT Inject 420 mg into the skin every 30 (thirty) days. 1 Cartridge 11  . ibuprofen (ADVIL) 200 MG tablet Take 200 mg by mouth every 6 (six) hours as needed for pain. Reported on 12/17/2015    . irbesartan (AVAPRO) 300 MG tablet Take 1 tablet (300 mg total) by mouth daily. 30 tablet 5  . levothyroxine (SYNTHROID, LEVOTHROID) 50 MCG tablet Take 50 mcg by mouth daily.     Marland Kitchen loratadine (CLARITIN) 10 MG tablet Take 10 mg by mouth daily as needed for allergies. Reported on 12/17/2015    . metoprolol succinate (TOPROL-XL) 50 MG 24 hr tablet Take 50 mg by mouth daily.  0   No current facility-administered medications for this visit.     Allergies  Allergen Reactions  . Statins Other (See  Comments)    Myalgias and memory problems  . Ciprofloxacin Itching    Splotchy redness with itching during IV infusion localized to arm.    Past Medical History:  Diagnosis Date  . Arthritis   . Asthma    allergy induced  . Bilateral carotid artery disease (HCC)    L carotid bruit  . CAD (coronary artery disease)   . Cancer ([place])   . Constipation   . Dyslipidemia    intolerant to statins, welchol, niacin, zetia  . History of blood transfusion   . History of nuclear stress test 04/24/2012   lexiscan; normal study  . Hypertension   . Hypothyroidism   . Postoperative nausea and vomiting 01/02/2016    Blood pressure (!) 182/72, pulse (!) 56.  Uncontrolled hypertension Blood pressure continues to be uncontrolled  today.  Will start patient on amlodipine 2.5 mg daily.  She will most likely need the full 10 mg, but she is hesitant to start another medication (would instead prefer to replace irbesartan).  I explained that she would most likely need 2-3 medications to control her pressure, that just switching one out for another would serve no purpose.  Will see her back in a month for follow up and medication titration.     Tommy Medal PharmD CPP [facility]

## 2016-09-16 ENCOUNTER — Ambulatory Visit (INDEPENDENT_AMBULATORY_CARE_PROVIDER_SITE_OTHER): Payer: Medicare Other | Admitting: Pharmacist

## 2016-09-16 VITALS — BP 162/88 | HR 60 | Wt 145.8 lb

## 2016-09-16 DIAGNOSIS — I10 Essential (primary) hypertension: Secondary | ICD-10-CM | POA: Diagnosis not present

## 2016-09-16 NOTE — Patient Instructions (Addendum)
Return for a  follow up appointment in 5 week  Your blood pressure today is 162/88 pulse 60  Check your blood pressure at home daily (if able) and keep record of the readings.  Take your BP meds as follows:             Irbesartan 300 mg every evening             Metoprolol succine 50 mg daily morning    Amlodipine 2.5mg  daily in the evening  **Continue lifestyle modifications including: less fast-food and take out, more physical activity and pain management**  Bring all of your meds, your BP cuff and your record of home blood pressures to your next appointment.  Exercise as you're able, try to walk approximately 30 minutes per day.  Keep salt intake to a minimum, especially watch canned and prepared boxed foods.  Eat more fresh fruits and vegetables and fewer canned items.  Avoid eating in fast food restaurants.    HOW TO TAKE YOUR BLOOD PRESSURE: . Rest 5 minutes before taking your blood pressure. .  Don't smoke or drink caffeinated beverages for at least 30 minutes before. . Take your blood pressure before (not after) you eat. . Sit comfortably with your back supported and both feet on the floor (don't cross your legs). . Elevate your arm to heart level on a table or a desk. . Use the proper sized cuff. It should fit smoothly and snugly around your bare upper arm. There should be enough room to slip a fingertip under the cuff. The bottom edge of the cuff should be 1 inch above the crease of the elbow. . Ideally, take 3 measurements at one sitting and record the average.

## 2016-09-16 NOTE — Progress Notes (Signed)
Patient ID: Shannon Scott                 DOB: 1941-02-06                      MRN: [id number]     HPI: Shannon Scott is a 76 y.o. female patient of Dr Debara Pickett, with a PMH below who presents today for hypertension clinic follow up.   She is s/p CABG in 2005, TIA and carotid stenosis.   She saw Shannon Scott and started the process to get Repatha Pushtronix.  It was approved by her insurance and called to the pharmacy.  Unfortunately the patient has been reading about side effects on the internet and decided that it would not be safe to take.  She has an LDL cholesterol of 257 with  [name].   She is adamant that the medication is unnecessary.   Patient presents to [name] clinic for follow up. Denies swelling, dizziness, palpitation or increased fatigue.  Reports increasing pain on her foot and lower abdomen that may be affecting her BP readings.    Blood Pressure Goal:  130/80  Current Medications:             Irbesartan 300 mg every evening             Metoprolol succine 50 mg daily morning    Amlodipine 2.5mg  daily in the evening  Family Hx:             Mother (88) - Hypertension and heart disease; died from pneumonia/PE             Father  (22) - stroke and heart disease;             (started with 13 total siblings - only 2 remain) several siblings with history of HTN, MI , stroke and CAD  Social Hx:             No tobacco or alcohol; does drink sweet tea daily  Diet:             States has lost about 20 pounds over past year; doesn't each much fried foods, prefers grilled; cut back on sweets, cut out ice cream but does eat frozen treats at [place] regularly   Exercise:             Previously went to gym for walking, plans to get back (had to stop when brother was ill)              Intolerances:               No intolerances to antihypertensive medications.   Home BP readings: 20 readings; 168/69 average (range 132-195/53-75); pulse 46-66 [facility]  Home Device (arm cuff) - NOT accurate with >38mmHg difference between readings and >67mmHg difference from manual reading.   Wt Readings from Last 3 Encounters:  08/02/16 145 lb (65.8 kg)  07/28/16 143 lb 14.4 oz (65.3 kg)  04/29/16 144 lb (65.3 kg)   BP Readings from Last 3 Encounters:  09/16/16 (!) 162/88  08/23/16 (!) 182/72  08/02/16 (!) 192/88   Pulse Readings from Last 3 Encounters:  09/16/16 60  08/23/16 (!) 56  08/02/16 60    Past Medical History:  Diagnosis Date  . Arthritis   . Asthma    allergy induced  . Bilateral carotid artery disease (HCC)    L carotid bruit  . CAD (coronary artery disease)   .  Cancer ([place])   . Constipation   . Dyslipidemia    intolerant to statins, welchol, niacin, zetia  . History of blood transfusion   . History of nuclear stress test 04/24/2012   lexiscan; normal study  . Hypertension   . Hypothyroidism   . Postoperative nausea and vomiting 01/02/2016    Current Outpatient Prescriptions on File Prior to Visit  Medication Sig Dispense Refill  . amLODipine (NORVASC) 2.5 MG tablet Take 1 tablet (2.5 mg total) by mouth daily. 30 tablet 3  . aspirin EC 81 MG tablet Take 81 mg by mouth daily.    Marland Kitchen docusate sodium (COLACE) 100 MG capsule Take 100 mg by mouth daily.    . Evolocumab with Infusor ([place]) 420 MG/3.5ML SOCT Inject 420 mg into the skin every 30 (thirty) days. 1 Cartridge 11  . ibuprofen (ADVIL) 200 MG tablet Take 200 mg by mouth every 6 (six) hours as needed for pain. Reported on 12/17/2015    . irbesartan (AVAPRO) 300 MG tablet Take 1 tablet (300 mg total) by mouth daily. 30 tablet 5  . levothyroxine (SYNTHROID, LEVOTHROID) 50 MCG tablet Take 50 mcg by mouth daily.     Marland Kitchen loratadine (CLARITIN) 10 MG tablet Take 10 mg by mouth daily as needed for allergies. Reported on 12/17/2015    . metoprolol succinate (TOPROL-XL) 50 MG 24 hr tablet Take 50 mg by mouth daily.  0   No current facility-administered medications  on file prior to visit.     Allergies  Allergen Reactions  . Statins Other (See Comments)    Myalgias and memory problems  . Ciprofloxacin Itching    Splotchy redness with itching during IV infusion localized to arm.    Blood pressure (!) 162/88, pulse 60.  Essential hypertension:  Blood pressure today remains above desired goal of 130/80, but much improved from previous reading of 182/72 during last OV.  Patient denies problems with current therapy and is not experiencing any ADRs at the moment.  Noted her BP home device is NOT accurate with ~7mmHg above manual reading.  I recommended change of amlodipine from 2.5mg  to 5mg  daily, but patient prefers to work with lifestyle modifications, and pain management prior to additional changes in BP medication.   Will continue current therapy without changes, change BP home device, continue daily monitoring and record keeping, f/u with PCP for pain management, and return for HTN follow up in 5 weeks.  Patient agreed with amlodipine dose change during next f/u if readings still above goal.  Kleigh Hoelzer Scott PharmD, [place] [id number] 09/16/2016 11:19 AM

## 2016-09-28 ENCOUNTER — Ambulatory Visit (HOSPITAL_COMMUNITY)
Admission: RE | Admit: 2016-09-28 | Discharge: 2016-09-28 | Disposition: A | Discharge status: Home / Self Care | Payer: Medicare Other | Source: Ambulatory Visit | Attending: Internal Medicine | Admitting: Internal Medicine

## 2016-09-28 ENCOUNTER — Other Ambulatory Visit: Payer: Self-pay | Admitting: Internal Medicine

## 2016-09-28 DIAGNOSIS — M25552 Pain in left hip: Secondary | ICD-10-CM | POA: Diagnosis present

## 2016-09-28 DIAGNOSIS — C541 Malignant neoplasm of endometrium: Secondary | ICD-10-CM

## 2016-10-07 ENCOUNTER — Encounter: Payer: Self-pay | Admitting: Sports Medicine

## 2016-10-07 ENCOUNTER — Ambulatory Visit (INDEPENDENT_AMBULATORY_CARE_PROVIDER_SITE_OTHER): Payer: Medicare Other | Admitting: Sports Medicine

## 2016-10-07 ENCOUNTER — Ambulatory Visit: Payer: Self-pay

## 2016-10-07 VITALS — BP 162/72 | HR 59 | Ht 61.0 in | Wt 150.2 lb

## 2016-10-07 DIAGNOSIS — M79671 Pain in right foot: Secondary | ICD-10-CM

## 2016-10-07 DIAGNOSIS — M722 Plantar fascial fibromatosis: Secondary | ICD-10-CM | POA: Diagnosis not present

## 2016-10-07 NOTE — Assessment & Plan Note (Addendum)
Markedly thickened plantar fascia the insertion as well as throughout the long arch.  Longitudinal arch strap as well as cushion orthotics and gel heel cups discussed. Alfredson exercises reviewed in detail with athletic training staff. Discussion regarding the merits of injection versus continued conservative care the patient does understand limitations of injection with like to hold off on this at this time.  If any lack of improvement I am happy to inject her at any time and we can also consider custom cushion orthotics if persistent ongoing unrelenting pain.   +++++++++++++++++++++++++++++++++++++++++++++++++++++++++++++++ PROCEDURE NOTE: THERAPEUTIC EXERCISES (97110) 15 minutes spent for Therapeutic exercises as stated in above notes.  This included exercises focusing on stretching, strengthening, with significant focus on eccentric aspects.   Proper technique shown and discussed handout in great detail with ATC.  All questions were discussed and answered.

## 2016-10-07 NOTE — Patient Instructions (Addendum)
Please perform the exercise program that Jeneen Rinks has prepared for you and gone over in detail on a daily basis.  In addition to the handout you were provided you can access your program through: [email or web address]   Your unique program code is: [email or web address]   Plantar Fasciitis Plantar fasciitis is a painful foot condition that affects the heel. It occurs when the band of tissue that connects the toes to the heel bone (plantar fascia) becomes irritated. This can happen after exercising too much or doing other repetitive activities (overuse injury). The pain from plantar fasciitis can range from mild irritation to severe pain that makes it difficult for you to walk or move. The pain is usually worse in the morning or after you have been sitting or lying down for a while. What are the causes? This condition may be caused by:  Standing for long periods of time.  Wearing shoes that do not fit.  Doing high-impact activities, including running, aerobics, and ballet.  Being overweight.  Having an abnormal way of walking (gait).  Having tight calf muscles.  Having high arches in your feet.  Starting a new athletic activity. What are the signs or symptoms? The main symptom of this condition is heel pain. Other symptoms include:  Pain that gets worse after activity or exercise.  Pain that is worse in the morning or after resting.  Pain that goes away after you walk for a few minutes. How is this diagnosed? This condition may be diagnosed based on your signs and symptoms. Your health care provider will also do a physical exam to check for:  A tender area on the bottom of your foot.  A high arch in your foot.  Pain when you move your foot.  Difficulty moving your foot. You may also need to have imaging studies to confirm the diagnosis. These can include:  X-rays.  Ultrasound.  MRI. How is this treated? Treatment for plantar fasciitis depends on the severity of the condition. Your  treatment may include:  Rest, ice, and over-the-counter pain medicines to manage your pain.  Exercises to stretch your calves and your plantar fascia.  A splint that holds your foot in a stretched, upward position while you sleep (night splint).  Physical therapy to relieve symptoms and prevent problems in the future.  Cortisone injections to relieve severe pain.  Extracorporeal shock wave therapy (ESWT) to stimulate damaged plantar fascia with electrical impulses. It is often used as a last resort before surgery.  Surgery, if other treatments have not worked after 12 months. Follow these instructions at home:  Take medicines only as directed by your health care provider.  Avoid activities that cause pain.  Roll the bottom of your foot over a bag of ice or a bottle of cold water. Do this for 20 minutes, 3-4 times a day.  Perform simple stretches as directed by your health care provider.  Try wearing athletic shoes with air-sole or gel-sole cushions or soft shoe inserts.  Wear a night splint while sleeping, if directed by your health care provider.  Keep all follow-up appointments with your health care provider. How is this prevented?  Do not perform exercises or activities that cause heel pain.  Consider finding low-impact activities if you continue to have problems.  Lose weight if you need to. The best way to prevent plantar fasciitis is to avoid the activities that aggravate your plantar fascia. Contact a health care provider if:  Your symptoms do not go away after treatment  with home care measures.  Your pain gets worse.  Your pain affects your ability to move or do your daily activities. This information is not intended to replace advice given to you by your health care provider. Make sure you discuss any questions you have with your health care provider. Document Released: 02/09/2001 Document Revised: 10/20/2015 Document Reviewed: 03/27/2014 Elsevier Interactive  Patient Education  2017 [employer].

## 2016-10-07 NOTE — Progress Notes (Signed)
OFFICE VISIT NOTE Shannon Scott. Shannon Scott, [place] at [facility] [phone]  [name] - 76 y.o. female MRN [id number]  Date of birth: [birth year]  Visit Date: 10/07/2016  PCP: [name], MD   Referred by: [name], MD  Shannon Scott S/CMA acting as scribe for Dr. [name].  SUBJECTIVE:   Chief Complaint  Patient presents with  . pain in foot   HPI: As below and per problem based documentation when appropriate.  Pain c/o pain in right foot and heel Pain started about 2 months ago.  Pain reports that pain started suddenly, she doesn't recall any injury to the foot.   The pain is described as constant, throbbing, shooting and is rated as 10/10 right now.  Worsened with walking, putting pressure on the foot. The foot is not tender to palpation.  Improves with Advil, ice Therapies tried include Advil with some relief. She has tried using heat and ice with some relief.   Other associated symptoms include: some mild ankle pain.    Review of Systems  Constitutional: Negative for chills and fever.    Otherwise per HPI.  HISTORY & PERTINENT PRIOR DATA:  No specialty comments available. She reports that she has never smoked. She has never used smokeless tobacco. No results for input(s): HGBA1C, LABURIC in the last 8760 hours. Medications & Allergies reviewed per EMR Patient Active Problem List   Diagnosis Date Noted  . Intractable right heel pain 10/07/2016  . Plantar fasciitis of right foot 10/07/2016  . Postoperative nausea and vomiting 01/02/2016  . Hyponatremia 01/01/2016  . Acute hypokalemia 01/01/2016  . Endometrial cancer ([place]) 12/30/2015  . Familial hypercholesteremia 07/28/2015  . Aortic valve stenosis 08/02/2014  . S/P CABG x 3 01/13/2013  . Palpitations 01/13/2013  . Medication intolerance 01/13/2013  . Chronic pain 01/13/2013  . TIA (transient ischemic attack) 01/13/2013  . Carotid stenosis 01/13/2013  .  Hypothyroid 10/05/2010  . Uncontrolled hypertension 10/05/2010  . Gastroesophagitis 10/05/2010  . Atrophic gastritis 10/05/2010   Past Medical History:  Diagnosis Date  . Arthritis   . Asthma    allergy induced  . Bilateral carotid artery disease (HCC)    L carotid bruit  . CAD (coronary artery disease)   . Cancer ([place])   . Constipation   . Dyslipidemia    intolerant to statins, welchol, niacin, zetia  . History of blood transfusion   . History of nuclear stress test 04/24/2012   lexiscan; normal study  . Hypertension   . Hypothyroidism   . Postoperative nausea and vomiting 01/02/2016   Family History  Problem Relation Age of Onset  . Hypertension Mother   . Heart disease Mother   . Stroke Father   . Heart disease Father   . Kidney disease Brother   . Heart disease Brother        also HTN, hyperlipidemia  . Heart attack Brother   . Stroke Sister        x2  . Hypertension Sister   . Heart disease Sister    Past Surgical History:  Procedure Laterality Date  . ABDOMINAL HYSTERECTOMY    . Carotid Doppler  02/2012   40-59% right int carotid artery stenosis; 60-79% L int carotid stenosis; L carotid bruit  . CORONARY ARTERY BYPASS GRAFT  03/12/2004   LIMA to LAD, SVG to circumflex, SVG to PDA  . IR GENERIC HISTORICAL  04/29/2016   IR RADIOLOGIST EVAL & MGMT  04/29/2016 Sandi Mariscal, MD [facility] INTERV RAD  . IR GENERIC HISTORICAL  05/12/2016   IR RADIOLOGIST EVAL & MGMT 05/12/2016 Sandi Mariscal, MD [facility] INTERV RAD  . ROBOTIC ASSISTED TOTAL HYSTERECTOMY WITH BILATERAL SALPINGO OOPHERECTOMY Bilateral 12/30/2015   Procedure: XI ROBOTIC ASSISTED TOTAL HYSTERECTOMY WITH BILATERAL SALPINGO OOPHORECTOMY WITH SENTAL LYMPH NODE BIOPSY;  Surgeon: Nancy Marus, MD;  Location: [facility] ORS;  Service: Gynecology;  Laterality: Bilateral;  . TONSILLECTOMY    . TRANSTHORACIC ECHOCARDIOGRAM  04/2007   EF>55%; mild MR; mild-mod TR; mild pulm HTN; mild calcification of aortiv valve leaflets with mild  valvular aortic stenosis  . TUBAL LIGATION     Social History   Occupational History  .  Retired   Social History Main Topics  . Smoking status: Never Smoker  . Smokeless tobacco: Never Used  . Alcohol use No  . Drug use: No  . Sexual activity: Not on file    OBJECTIVE:  VS:  HT:5\' 1"  (154.9 cm)   WT:150 lb 3.2 oz (68.1 kg)  BMI:28.4    BP:(!) 162/72  HR:(!) 59bpm  TEMP: ( )  RESP:98 % EXAM: Findings:  WDWN, NAD, Non-toxic appearing Alert & appropriately interactive Not depressed or anxious appearing No increased work of breathing. Pupils are equal. EOM intact without nystagmus No clubbing or cyanosis of the extremities appreciated No significant rashes/lesions/ulcerations overlying the examined area. DP & PT pulses 2+/4.  No significant pretibial edema.  No clubbing or cyanosis Bilateral feet: Moderate cavus foot with good forefoot and midfoot motion. Equinus contracture to 85 on the right and 95 on the left.  DP PT pulses 2+/4. Marked TTP over the insertion of the plantar fascia as well as along the belly of the plantar fascia throughout the longitudinal arch.  She has early splay toe deformity.  ++++++++++++++++++++++++++++++++++++++++++++++++++++++++++++++++++ LIMITED MSK ULTRASOUND OF right foot Images were obtained and interpreted by myself, Teresa Coombs, DO  Images have been saved and stored to PACS system. Images obtained on: GE S7 Ultrasound machine  FINDINGS:  Insertional thickening of the plantar fascia at 0.77 cm Longitudinal arch is thickened throughout measuring 0.3 cm  IMPRESSION:  Plantar fasciitis with associated long arch strain.     Dg Hip Unilat With Pelvis 2-3 Views Left  Result Date: 09/28/2016 CLINICAL DATA:  Left hip pain, history of endometrial cancer EXAM: DG HIP (WITH OR WITHOUT PELVIS) 2-3V LEFT COMPARISON:  None. FINDINGS: No fracture or dislocation is seen. Bilobed hip joint spaces are preserved. Visualized bony pelvis appears  intact. IMPRESSION: Negative. Electronically Signed   By: Julian Hy M.D.   On: 09/28/2016 13:39   ASSESSMENT & PLAN:   Problem List Items Addressed This Visit    Intractable right heel pain - Primary   Relevant Orders   [place] LIMITED JOINT SPACE STRUCTURES LOW RIGHT(NO LINKED CHARGES)   Plantar fasciitis of right foot    Markedly thickened plantar fascia the insertion as well as throughout the long arch.  Longitudinal arch strap as well as cushion orthotics and gel heel cups discussed. Alfredson exercises reviewed in detail with athletic training staff. Discussion regarding the merits of injection versus continued conservative care the patient does understand limitations of injection with like to hold off on this at this time.  If any lack of improvement I am happy to inject her at any time and we can also consider custom cushion orthotics if persistent ongoing unrelenting pain.   +++++++++++++++++++++++++++++++++++++++++++++++++++++++++++++++ PROCEDURE NOTE: THERAPEUTIC EXERCISES (97110) 15 minutes spent for Therapeutic exercises  as stated in above notes.  This included exercises focusing on stretching, strengthening, with significant focus on eccentric aspects.   Proper technique shown and discussed handout in great detail with ATC.  All questions were discussed and answered.           Follow-up: Return in about 6 weeks (around 11/18/2016) for repeat diagnostic ultrasound.   CMA/ATC served as Education administrator during this visit. History, Physical, and Plan performed by medical provider. Documentation and orders reviewed and attested to.      Teresa Coombs, [facility] Physician    10/07/2016 10:12 AM

## 2016-10-21 ENCOUNTER — Ambulatory Visit (INDEPENDENT_AMBULATORY_CARE_PROVIDER_SITE_OTHER): Payer: Medicare Other | Admitting: Pharmacist

## 2016-10-21 VITALS — BP 158/60 | HR 58

## 2016-10-21 DIAGNOSIS — I10 Essential (primary) hypertension: Secondary | ICD-10-CM

## 2016-10-21 MED ORDER — DOXAZOSIN MESYLATE 2 MG PO TABS: 2.0000 mg | ORAL_TABLET | Freq: Every day | ORAL | 0 refills | Status: DC

## 2016-10-21 NOTE — Progress Notes (Signed)
Patient ID: Shannon Scott                 DOB: 01-15-1941                      MRN: [id number]     HPI: Shannon Scott is a 76 y.o. female referred by Dr. Debara Pickett  to HTN clinic. PMH includes CAD s/p CABG in 2005,  TIA and carotid stenosis. Patient is reluctant to most medication changes.   Amlodipine dose was increased from 2.5mg  daily to 5mg  daily by her PCP few weeks ago.   She presents to [facility] clinic for follow up and denies  dizziness, palpitation , shortness of breath or chest pain.  Reports increase lower extremity edema and increase fatigue.  Current HTN meds: Irbesartan 300mg  daily Amlodipine 5mg  daily Metoprolol succinate 50mg  daily   BP goal: 130/80  Family History: hypertension and heart disease form mother; sroke and heart disease from father; several siblings with hx of HTRN, MI, stroke and CAD  Social History: Denies tobacco and alcohol use  Diet: Has lost about 20 pounds over past year; doesn't each much fried foods, prefers grilled; cut back on sweets, cut out ice cream but does eat frozen treats at [place] regularly   Exercise: Plan to go back to daily walking soon  Home BP readings: 13 readings; average 168/77; pulse 51-65  Wt Readings from Last 3 Encounters:  10/07/16 150 lb 3.2 oz (68.1 kg)  09/16/16 145 lb 12.8 oz (66.1 kg)  08/02/16 145 lb (65.8 kg)   BP Readings from Last 3 Encounters:  10/21/16 (!) 158/60  10/07/16 (!) 162/72  09/16/16 (!) 162/88   Pulse Readings from Last 3 Encounters:  10/21/16 (!) 58  10/07/16 (!) 59  09/16/16 60    Past Medical History:  Diagnosis Date  . Arthritis   . Asthma    allergy induced  . Bilateral carotid artery disease (HCC)    L carotid bruit  . CAD (coronary artery disease)   . Cancer ([place])   . Constipation   . Dyslipidemia    intolerant to statins, welchol, niacin, zetia  . History of blood transfusion   . History of nuclear stress test 04/24/2012   lexiscan; normal study  . Hypertension   .  Hypothyroidism   . Postoperative nausea and vomiting 01/02/2016    Current Outpatient Prescriptions on File Prior to Visit  Medication Sig Dispense Refill  . amLODipine (NORVASC) 2.5 MG tablet Take 1 tablet (2.5 mg total) by mouth daily. 30 tablet 3  . aspirin EC 81 MG tablet Take 81 mg by mouth daily.    Marland Kitchen docusate sodium (COLACE) 100 MG capsule Take 100 mg by mouth daily.    Marland Kitchen ibuprofen (ADVIL) 200 MG tablet Take 200 mg by mouth every 6 (six) hours as needed for pain. Reported on 12/17/2015    . irbesartan (AVAPRO) 300 MG tablet Take 1 tablet (300 mg total) by mouth daily. 30 tablet 5  . levothyroxine (SYNTHROID, LEVOTHROID) 50 MCG tablet Take 50 mcg by mouth daily.     Marland Kitchen loratadine (CLARITIN) 10 MG tablet Take 10 mg by mouth daily as needed for allergies. Reported on 12/17/2015    . metoprolol succinate (TOPROL-XL) 50 MG 24 hr tablet Take 50 mg by mouth daily.  0  . Vitamin D, Ergocalciferol, (DRISDOL) 50000 units CAPS capsule take 1 capsule by mouth TWO TIMES PER WEEK  0   No current facility-administered  medications on file prior to visit.     Allergies  Allergen Reactions  . Statins Other (See Comments)    Myalgias and memory problems  . Ciprofloxacin Itching    Splotchy redness with itching during IV infusion localized to arm.    Blood pressure (!) 158/60, pulse (!) 58.  Uncontrolled hypertension: Blood pressure today remains above goal. Patient report lower extremity swelling and increase fatigue since amlodipine started with worsening symptoms after dose  increased to 5mg  daily.  Will discontinue amlodipine and start doxazosin 2mg  at bedtime. Per patient request , order for 15 day supply sent to pharmacy with a phone follow up in 2 weeks and office f/u in 4 weeks. Plan to use hydralazine 25mg  to therapy if patient unable to tolerate doxazosin.  Mariyanna Mucha Rodriguez-Guzman PharmD, [place] [id number] 10/21/2016 10:35 AM

## 2016-10-21 NOTE — Patient Instructions (Addendum)
Return for a  follow up appointment in  5 weeks (follow up by phone in 2 weeks)  Your blood pressure today is 158/60 pulse 58  Check your blood pressure at home daily (if able) and keep record of the readings.  Take your BP meds as follows: **STOP taking amlodipine ** **START doxazosin 2mg  at bedtime** All other medication as prescribed  Bring all of your meds, your BP cuff and your record of home blood pressures to your next appointment.  Exercise as you're able, try to walk approximately 30 minutes per day.  Keep salt intake to a minimum, especially watch canned and prepared boxed foods.  Eat more fresh fruits and vegetables and fewer canned items.  Avoid eating in fast food restaurants.    HOW TO TAKE YOUR BLOOD PRESSURE: . Rest 5 minutes before taking your blood pressure. .  Don't smoke or drink caffeinated beverages for at least 30 minutes before. . Take your blood pressure before (not after) you eat. . Sit comfortably with your back supported and both feet on the floor (don't cross your legs). . Elevate your arm to heart level on a table or a desk. . Use the proper sized cuff. It should fit smoothly and snugly around your bare upper arm. There should be enough room to slip a fingertip under the cuff. The bottom edge of the cuff should be 1 inch above the crease of the elbow. . Ideally, take 3 measurements at one sitting and record the average.

## 2016-11-06 ENCOUNTER — Other Ambulatory Visit: Payer: Self-pay | Admitting: Internal Medicine

## 2016-11-16 ENCOUNTER — Ambulatory Visit (INDEPENDENT_AMBULATORY_CARE_PROVIDER_SITE_OTHER): Payer: Medicare Other | Admitting: Sports Medicine

## 2016-11-16 ENCOUNTER — Encounter: Payer: Self-pay | Admitting: Sports Medicine

## 2016-11-16 VITALS — BP 150/50 | HR 59 | Ht 61.0 in | Wt 154.0 lb

## 2016-11-16 DIAGNOSIS — M722 Plantar fascial fibromatosis: Secondary | ICD-10-CM | POA: Diagnosis not present

## 2016-11-16 NOTE — Progress Notes (Signed)
OFFICE VISIT NOTE [facility] Scott. [facility] Scott, [place] [facility]  WAYNESHA Scott - 76 y.o. female MRN [id number]  Date of birth: 01-19-1941  Visit Date: 11/16/2016  PCP: [name], MD   Referred by: [name], MD  Burlene Arnt, CMA acting as scribe for Dr. Paulla Scott.  SUBJECTIVE:   Chief Complaint  Patient presents with  . Follow-up    plantar fasciitis of the right foot   HPI: As below and per problem based documentation when appropriate.  Pt presents today in follow-up of plantar fasciitis of the right foot. She had an u/s done 09/1016. She was taking Advil for the pain. She was also given home exercises to try to see if that would help with the pain. If pain persisted then consider injection and/or custom orthotics.   Pt is still taking Advil for the pain and that is helping. She has not been compliant with home exercises. She is still having heel pain, its not as back but its still present. She was having some pain on the medial aspect of the foot but that has resolved. Pain is currently rated about 3/10 but 6/10 when its at its worst. She normally takes 2 Advil a day and feels like this helps. The pain is worse when the Advil wears off, she feels a throbbing pain. She has noticed some swelling in the foot and ankle. She elevates her feet when she is sitting on the couch. She has applied heat and ice to the heel and gotten some relief. She was thinking about an injection but she thought Dr. Paulla Scott said it wouldn't be helpful for her. She says that she did pick up some inserts for her shoes and they are helping with the pain. She says that she also feels less pain when there is a small heel on her shoe vs a flat shoe.   Pt denies fever, chills, night sweats. She has had a 4 lbs weight gain since her last visit and attributes that to fluid/swelling in the left leg.     Review of Systems  Constitutional: Negative for chills  and fever.  Respiratory: Negative for shortness of breath and wheezing.   Cardiovascular: Positive for leg swelling. Negative for chest pain and palpitations.  Musculoskeletal: Negative for falls.  Neurological: Positive for tingling (fingers and toes). Negative for dizziness and headaches.  Endo/Heme/Allergies: Does not bruise/bleed easily.    Otherwise per HPI.  HISTORY & PERTINENT PRIOR DATA:  No specialty comments available. She reports that she has never smoked. She has never used smokeless tobacco. No results for input(s): HGBA1C, LABURIC in the last 8760 hours. Medications & Allergies reviewed per EMR Patient Active Problem List   Diagnosis Date Noted  . Intractable right heel pain 10/07/2016  . Plantar fasciitis of right foot 10/07/2016  . Postoperative nausea and vomiting 01/02/2016  . Hyponatremia 01/01/2016  . Acute hypokalemia 01/01/2016  . Endometrial cancer ([place]) 12/30/2015  . Familial hypercholesteremia 07/28/2015  . Aortic valve stenosis 08/02/2014  . S/P CABG x 3 01/13/2013  . Palpitations 01/13/2013  . Medication intolerance 01/13/2013  . Chronic pain 01/13/2013  . TIA (transient ischemic attack) 01/13/2013  . Carotid stenosis 01/13/2013  . Hypothyroid 10/05/2010  . Uncontrolled hypertension 10/05/2010  . Gastroesophagitis 10/05/2010  . Atrophic gastritis 10/05/2010   Past Medical History:  Diagnosis Date  . Arthritis   . Asthma    allergy induced  . Bilateral carotid  artery disease ([place])    L carotid bruit  . CAD (coronary artery disease)   . Cancer ([place])   . Constipation   . Dyslipidemia    intolerant to statins, welchol, niacin, zetia  . History of blood transfusion   . History of nuclear stress test 04/24/2012   lexiscan; normal study  . Hypertension   . Hypothyroidism   . Postoperative nausea and vomiting 01/02/2016   Family History  Problem Relation Age of Onset  . Hypertension Mother   . Heart disease Mother   . Stroke Father   . Heart  disease Father   . Kidney disease Brother   . Heart disease Brother        also HTN, hyperlipidemia  . Heart attack Brother   . Stroke Sister        x2  . Hypertension Sister   . Heart disease Sister    Past Surgical History:  Procedure Laterality Date  . ABDOMINAL HYSTERECTOMY    . Carotid Doppler  02/2012   40-59% right int carotid artery stenosis; 60-79% L int carotid stenosis; L carotid bruit  . CORONARY ARTERY BYPASS GRAFT  03/12/2004   LIMA to LAD, SVG to circumflex, SVG to PDA  . IR GENERIC HISTORICAL  04/29/2016   IR RADIOLOGIST EVAL & MGMT 04/29/2016 Sandi Mariscal, MD [facility] INTERV RAD  . IR GENERIC HISTORICAL  05/12/2016   IR RADIOLOGIST EVAL & MGMT 05/12/2016 Sandi Mariscal, MD [facility] INTER[facility] RAD  . ROBOTIC ASSISTED TOTAL HYSTERECTOMY WITH BILATERAL SALPINGO OOPHERECTOMY Bilateral 12/30/2015   Procedure: XI ROBOTIC ASSISTED TOTAL HYSTERECTOMY WITH BILATERAL SALPINGO OOPHORECTOMY WITH SENTAL LYMPH NODE BIOPSY;  Surgeon: Nancy Marus, MD;  Location: [facility];  Service: Gynecology;  Laterality: Bilateral;  . TONSILLECTOMY    . TRANSTHORACIC ECHOCARDIOGRAM  04/2007   EF>55%; mild MR; mild-mod TR; mild pulm HTN; mild calcification of aortiv valve leaflets with mild valvular aortic stenosis  . TUBAL LIGATION     Social History   Occupational History  .  Retired   Social History Main Topics  . Smoking status: Never Smoker  . Smokeless tobacco: Never Used  . Alcohol use No  . Drug use: No  . Sexual activity: Not on file    OBJECTIVE:  VS:  HT:5\' 1"  (154.9 cm)   WT:154 lb (69.9 kg)  BMI:29.2    BP:(!) 150/50  HR:(!) 59bpm  TEMP: ( )  RESP:96 % EXAM: Findings:  WDWN, NAD, Non-toxic appearing Alert & appropriately interactive Not depressed or anxious appearing No increased work of breathing. Pupils are equal. EOM intact without nystagmus No clubbing or cyanosis of the extremities appreciated No significant rashes/lesions/ulcerations overlying the examined area. DP & PT  pulses 2+/4.  Trace bilateral pitting edema. Sensation intact to light touch in lower extremities.  Right foot: No significant deformity.  Markedly high cavus foot, rigid transverse arch breakdown. Dorsiflexion to 95 which is improved.  Still remain tight.  Ankle exam is stable. She is only mildly tender over the origin of the plantar fascia.  No pain across the midfoot or longitudinal arch.     No results found. ASSESSMENT & PLAN:   Problem List Items Addressed This Visit    Plantar fasciitis of right foot - Primary    Overall she is moderately improved.  Advil is helping when she has severe pain is typically taking 1 Advil daily.  Like to see her try to wean off of this to help reduce on the lower extremity swelling that  she is having and if any lack of improvement she should follow-up with her PCP for this.  She has not been performing therapeutic exercises and has only mild pain at the origin of the plantar fascia.  Repeat ultrasound was deferred.  She should continue with her therapeutic exercises and she reported that she would be able to perform these 3 times weekly.  This should provide a moderate benefit and if any lack of improvement she will call to set up an injection for the plantar fascia.         Follow-up: Return if symptoms worsen or fail to improve.   CMA/ATC served as Education administrator during this visit. History, Physical, and Plan performed by medical provider. Documentation and orders reviewed and attested to.      Teresa Coombs, [facility] Physician

## 2016-11-16 NOTE — Assessment & Plan Note (Signed)
Overall she is moderately improved.  Advil is helping when she has severe pain is typically taking 1 Advil daily.  Like to see her try to wean off of this to help reduce on the lower extremity swelling that she is having and if any lack of improvement she should follow-up with her PCP for this.  She has not been performing therapeutic exercises and has only mild pain at the origin of the plantar fascia.  Repeat ultrasound was deferred.  She should continue with her therapeutic exercises and she reported that she would be able to perform these 3 times weekly.  This should provide a moderate benefit and if any lack of improvement she will call to set up an injection for the plantar fascia.

## 2016-11-18 ENCOUNTER — Encounter: Payer: Self-pay | Admitting: Internal Medicine

## 2016-11-18 ENCOUNTER — Ambulatory Visit (INDEPENDENT_AMBULATORY_CARE_PROVIDER_SITE_OTHER): Payer: Medicare Other | Admitting: Internal Medicine

## 2016-11-18 VITALS — BP 170/86 | HR 60 | Ht 61.0 in | Wt 151.8 lb

## 2016-11-18 DIAGNOSIS — Z951 Presence of aortocoronary bypass graft: Secondary | ICD-10-CM

## 2016-11-18 DIAGNOSIS — I10 Essential (primary) hypertension: Secondary | ICD-10-CM

## 2016-11-18 DIAGNOSIS — E7801 Familial hypercholesterolemia: Secondary | ICD-10-CM

## 2016-11-18 DIAGNOSIS — Z79899 Other long term (current) drug therapy: Secondary | ICD-10-CM | POA: Diagnosis not present

## 2016-11-18 MED ORDER — CHLORTHALIDONE 25 MG PO TABS: 25.0000 mg | ORAL_TABLET | Freq: Every day | ORAL | 5 refills | Status: DC

## 2016-11-18 NOTE — Patient Instructions (Signed)
Your physician has recommended you make the following change in your medication:  -- START chlorthalidone 25mg  once daily  Your physician recommends that you return for lab work ONE WEEK after starting medication  Your physician wants you to follow-up in: 6 months with Dr. Debara Pickett. You will receive a reminder letter in the mail two months in advance. If you don't receive a letter, please call our office to schedule the follow-up appointment.

## 2016-11-18 NOTE — Progress Notes (Signed)
OFFICE NOTE  Chief Complaint:  Leg swelling  Primary Care Physician: [name], MD  HPI:  Shannon Scott is a 76 year old female with severe combined dyslipidemia with CABG in 2005 who has been intolerant to statins, Welchol, niacin, Zetia, and other cholesterol-lower medications. In the past I had sent her to Dr. Moshe Cipro at [facility], who is a lipidologist, for evaluation for apheresis. However, due to the cost of close to $1000 a month she felt that was intolerable. Recently she was complaining of neck pain, underwent carotid Dopplers that you ordered, and those demonstrated 40% to 59% right internal carotid stenosis and a 60% to 79% left internal carotid stenosis. She does have a left carotid bruit. In addition, she is describing a band-like pressure around her chest which she attributed to her blood pressure medicine and then had actually discontinued that; however, the symptoms still persist. This is a little bit worse with exertion, does relieve with rest or taking blood pressure medicine, and is concerning for angina. She's also complaining of some left-sided numbness facial tingling which could be TIA. She has not been taking aspirin and she reports developing what sounds like petechiae on that, but is also not willing to take Plavix or other antiplatelet medications. She does also report pain center called for her body and her shoulders and her knees and her legs as well.  Shannon Scott returns today for follow-up. It's been about a year and a half since I seen her. She does have a history of multivessel coronary disease and prior bypass in 2005. Her last stress test was in 2013 which was negative for ischemia. An echo in 2008 showed probable mild aortic stenosis. She does have a murmur of aortic stenosis today which may be a little louder. Blood pressure is also poorly controlled today however she said she did not take her medicine. She says that she is taking only a half of  her herbs certain pill as it causes her more swelling if she takes the whole pill, despite the fact that contains a diuretic. She's been very intolerant to almost all medications including all of the statins and Zetia. No medicines of been tolerated and she continues to have a very high cholesterol over 300. I do think she be a good candidate for a PC SK 9 inhibitor.  Shannon Scott returns today for follow-up. She is recently started on BiDil and report some mild improvement in blood pressure although she feels when her blood pressure gets low that she is symptomatic. She may have some degree of lack of cerebral autoregulation due to long-standing hypertension. Unfortunate she's been intolerant to all medications. She's been tried on all of the statin medications, WelChol, niacin and steady and has been intolerant. I referred her to [facility] in 2013 for apheresis, however due to the very high cost this was never pursued. With the recent approval of PC SK 9 inhibitors, I think this would be a very good option for her. She has known ASCVD and FH based on her lipid profile. Her most recent cholesterol demonstrated total cholesterol 401, triglycerides 91, HDL 50, and LDL 333. Based on these findings her Dutch score is 8.  08/02/2016  Shannon Scott returns today for follow-up. Although she is asymptomatic, she still has significant untreated risk factors. Most notably she likely has heterozygous FH. Her brother died recently suddenly, which is suspicious for cardiovascular disease. She does have 2 children, however who apparently do not have any significant cholesterol  abnormalities. She does also have a parent to had premature coronary artery disease. Her most recent LDL cholesterol was over 300 but has not been checked in a number of years. She's been intolerant to all statins, Zetia and WelChol. She previously been referred for lipoprotein a pheresis however did not make that appointment due to the  cost.  11/18/2016  Shannon Scott was seen today again in follow-up. She underwent recent lipid testing which again is consistent with either FH or possibly an autosomal recessive LDL receptor defect or gain of function mutation and PCSK9. LDL particle numbers remain elevated at 2191, despite this she has in a pattern. Total cholesterol 333 with LDL-C2 57 and HDL-C 56. Non-HDL cholesterol of 277. She is speaking with our pharmacist about starting Redpath but reports that she is not interested in medication. She also says she does not think she can afford the medication and can barely pay for house meds. Blood pressure remains not at goal today. She reports pain short of breath and having lower extremity swelling. Recently doxazosin was added at night - but she's not noted any significant difference in her blood pressure.  PMHx:  Past Medical History:  Diagnosis Date  . Arthritis   . Asthma    allergy induced  . Bilateral carotid artery disease ([facility])    L carotid bruit  . CAD (coronary artery disease)   . Cancer ([place])   . Constipation   . Dyslipidemia    intolerant to statins, welchol, niacin, zetia  . History of blood transfusion   . History of nuclear stress test 04/24/2012   lexiscan; normal study  . Hypertension   . Hypothyroidism   . Postoperative nausea and vomiting 01/02/2016    Past Surgical History:  Procedure Laterality Date  . ABDOMINAL HYSTERECTOMY    . Carotid Doppler  02/2012   40-59% right int carotid artery stenosis; 60-79% L int carotid stenosis; L carotid bruit  . CORONARY ARTERY BYPASS GRAFT  03/12/2004   LIMA to LAD, SVG to circumflex, SVG to PDA  . IR GENERIC HISTORICAL  04/29/2016   IR RADIOLOGIST EVAL & MGMT 04/29/2016 Sandi Mariscal, MD [facility] INTERV RAD  . IR GENERIC HISTORICAL  05/12/2016   IR RADIOLOGIST EVAL & MGMT 05/12/2016 Sandi Mariscal, MD [facility] INTERV RAD  . ROBOTIC ASSISTED TOTAL HYSTERECTOMY WITH BILATERAL SALPINGO OOPHERECTOMY Bilateral 12/30/2015    Procedure: XI ROBOTIC ASSISTED TOTAL HYSTERECTOMY WITH BILATERAL SALPINGO OOPHORECTOMY WITH SENTAL LYMPH NODE BIOPSY;  Surgeon: Nancy Marus, MD;  Location: [facility];  Service: Gynecology;  Laterality: Bilateral;  . TONSILLECTOMY    . TRANSTHORACIC ECHOCARDIOGRAM  04/2007   EF>55%; mild MR; mild-mod TR; mild pulm HTN; mild calcification of aortiv valve leaflets with mild valvular aortic stenosis  . TUBAL LIGATION      FAMHx:  Family History  Problem Relation Age of Onset  . Hypertension Mother   . Heart disease Mother   . Stroke Father   . Heart disease Father   . Kidney disease Brother   . Heart disease Brother        also HTN, hyperlipidemia  . Heart attack Brother   . Stroke Sister        x2  . Hypertension Sister   . Heart disease Sister     SOCHx:   reports that she has never smoked. She has never used smokeless tobacco. She reports that she does not drink alcohol or use drugs.  ALLERGIES:  Allergies  Allergen Reactions  . Statins  Other (See Comments)    Myalgias and memory problems  . Ciprofloxacin Itching    Splotchy redness with itching during IV infusion localized to arm.    ROS: Pertinent items noted in HPI and remainder of comprehensive ROS otherwise negative.  HOME MEDS: Current Outpatient Prescriptions  Medication Sig Dispense Refill  . aspirin EC 81 MG tablet Take 81 mg by mouth daily.    Marland Kitchen docusate sodium (COLACE) 100 MG capsule Take 100 mg by mouth daily.    Marland Kitchen doxazosin (CARDURA) 2 MG tablet take 1 tablet by mouth at bedtime 30 tablet 0  . ibuprofen (ADVIL) 200 MG tablet Take 200 mg by mouth every 6 (six) hours as needed for pain. Reported on 12/17/2015    . irbesartan (AVAPRO) 300 MG tablet Take 1 tablet (300 mg total) by mouth daily. 30 tablet 5  . levothyroxine (SYNTHROID, LEVOTHROID) 50 MCG tablet Take 50 mcg by mouth daily.     Marland Kitchen loratadine (CLARITIN) 10 MG tablet Take 10 mg by mouth daily as needed for allergies. Reported on 12/17/2015    .  metoprolol succinate (TOPROL-XL) 50 MG 24 hr tablet Take 50 mg by mouth daily.  0  . Vitamin D, Ergocalciferol, (DRISDOL) 50000 units CAPS capsule take 1 capsule by mouth TWO TIMES PER WEEK  0  . chlorthalidone (HYGROTON) 25 MG tablet Take 1 tablet (25 mg total) by mouth daily. 30 tablet 5   No current facility-administered medications for this visit.     LABS/IMAGING: No results found for this or any previous visit (from the past 48 hour(s)). No results found.  VITALS: BP (!) 170/86   Pulse 60   Ht 5' 1"  (1.549 m)   Wt 151 lb 12.8 oz (68.9 kg)   BMI 28.68 kg/m   EXAM: General appearance: alert and no distress Neck: no adenopathy, + left carotid bruit, no JVD, supple, symmetrical, trachea midline and thyroid not enlarged, symmetric, no tenderness/mass/nodules Lungs: clear to auscultation bilaterally Heart: regular rate and rhythm, S1, S2 normal, no murmur, click, rub or gallop Abdomen: soft, non-tender; bowel sounds normal; no masses,  no organomegaly Extremities: extremities normal, atraumatic, no cyanosis or edema Pulses: 2+ and symmetric Skin: Skin color, texture, turgor normal. No rashes or lesions Neurologic: Grossly normal  EKG: Normal sinus rhythm at 60, possible LAE - personally reviewed  ASSESSMENT: 1. Possible heterozygous familial hypercholesterolemia (HeFH) with a [place] score of 8, also consider LDL adapter protein deficiency or GOF mutation in PCSK9 2. History of TIAs 3. Multidrug intolerance - including statins, welchol, zetia, etc. 3.   CABG x 3 (2005) 4.   Known moderate to severe bilateral carotid artery disease  5.   Uncontrolled hypertension 6.   Aortic stenosis (LPa - 123), ApoB of 175  PLAN: 1.   Ms. Killingsworth remains at very high risk of ASCVD events and has had prior CABG. There is a strong family history of coronary disease and premature coronary artery disease in her family. She is at high likelihood of repeat bypass surgery, early bypass, graft  closure and progressive aortic stenosis. One of the major predictive factors is elevated LPa which was 123. She is not interested in PCSK9 inhibitors at this time, namely related to cost. She understands that she is at significantly increased risk for progressive coronary and valvular heart disease. With regards to her shortness of breath and lower strandy swelling and uncontrolled hypertension, would advise adding chlorthalidone 25 mg daily. We'll recheck a metabolic profile one week.  Follow-up in 6 months.  Pixie Casino, MD, [facility] Attending Cardiologist Milton C Jiro Kiester 11/18/2016, 2:38 PM

## 2016-11-24 NOTE — Progress Notes (Signed)
Patient ID: VASHON ARCH                 DOB: [birth year]                      MRN: [id number]     HPI: Shannon Scott is a 76 y.o. female referred by Dr. Debara Pickett to HTN clinic. PMH includes CAD s/p CABG in 2005,  TIA and carotid stenosis. Patient is reluctant to most medication changes. Patient reported increase low extremities edema with amlodipine 5mg  daily and it was discontinued on 10/21/16.   Doxazosin 2mg  was added at that time with minimal impact on blood pressure. Chlorthalidone 25mg  daily was added to her therapy by Dr Debara Pickett on 11/18/16 and patient is due to repeat BMET today 11/25/16 per MD instructions.  Patient presnets today for HTN follow up. Denies dizziness, pain, headaches or fatigue. Report significant improvement on her low extremities edema and home BP readings.  She stopped taking Ibuprofen per podiatrist recommendation and reports significant improvement on foot pain.  Current HTN meds: Doxazosin 2mg  every evening Irbesartan 300mg  daily Chlorthalidone 25mg  daily Metoprolol succinate 50mg  daily   BP goal: 130/80  Family History: hypertension and heart disease form mother; sroke and heart disease from father; several siblings with hx of HTRN, MI, stroke and CAD  Social History: Denies tobacco and alcohol use  Diet: Has lost about 20 pounds over past year; doesn't each much fried foods, prefers grilled; cut back on sweets, cut out ice cream but does eat frozen treats at [employer] regularly   Home BP readings: 15 readings; average 129/64; pulse 61-74  Wt Readings from Last 3 Encounters:  11/18/16 151 lb 12.8 oz (68.9 kg)  11/16/16 154 lb (69.9 kg)  10/07/16 150 lb 3.2 oz (68.1 kg)   BP Readings from Last 3 Encounters:  11/25/16 (!) 152/60  11/18/16 (!) 170/86  11/16/16 (!) 150/50   Pulse Readings from Last 3 Encounters:  11/25/16 60  11/18/16 60  11/16/16 (!) 59    Past Medical History:  Diagnosis Date  . Arthritis   . Asthma    allergy induced  .  Bilateral carotid artery disease ([facility])    L carotid bruit  . CAD (coronary artery disease)   . Cancer ([place])   . Constipation   . Dyslipidemia    intolerant to statins, welchol, niacin, zetia  . History of blood transfusion   . History of nuclear stress test 04/24/2012   lexiscan; normal study  . Hypertension   . Hypothyroidism   . Postoperative nausea and vomiting 01/02/2016    Current Outpatient Prescriptions on File Prior to Visit  Medication Sig Dispense Refill  . aspirin EC 81 MG tablet Take 81 mg by mouth daily.    . chlorthalidone (HYGROTON) 25 MG tablet Take 1 tablet (25 mg total) by mouth daily. 30 tablet 5  . docusate sodium (COLACE) 100 MG capsule Take 100 mg by mouth daily.    Marland Kitchen doxazosin (CARDURA) 2 MG tablet take 1 tablet by mouth at bedtime 30 tablet 0  . ibuprofen (ADVIL) 200 MG tablet Take 200 mg by mouth every 6 (six) hours as needed for pain. Reported on 12/17/2015    . irbesartan (AVAPRO) 300 MG tablet Take 1 tablet (300 mg total) by mouth daily. 30 tablet 5  . levothyroxine (SYNTHROID, LEVOTHROID) 50 MCG tablet Take 50 mcg by mouth daily.     Marland Kitchen loratadine (CLARITIN) 10 MG tablet Take 10  mg by mouth daily as needed for allergies. Reported on 12/17/2015    . metoprolol succinate (TOPROL-XL) 50 MG 24 hr tablet Take 50 mg by mouth daily.  0  . Vitamin D, Ergocalciferol, (DRISDOL) 50000 units CAPS capsule take 1 capsule by mouth TWO TIMES PER WEEK  0   No current facility-administered medications on file prior to visit.     Allergies  Allergen Reactions  . Statins Other (See Comments)    Myalgias and memory problems  . Ciprofloxacin Itching    Splotchy redness with itching during IV infusion localized to arm.    Blood pressure (!) 152/60, pulse 60.  Familial hypercholesteremia LDL remains elevated above goal of < 70mg /dL for secondary prevention. Insurance pre-approval was obtained for Repatha 420mg  infusion on 08/11/2016. [place] paperwork also  provided to patient to complete and help with affordability. She expressed concerned with potential side effects seen on Internet and TV commercial , therefore,  not interested on initiating any PCSK9 inhibitor.  We revisit the option for Repatha today 11/25/16 during pharmacist clinic visit but patient stated she still not "ready" to make that decision yet.  Essential hypertension Blood pressure remains elevated today but showed some improvement from previous readings. BP at home appears controlled per patients records. She didn't took any medication this morning in preparation for blood work;  therefore, BP readings may be better 1-2 hours after medication administration. No adverse reaction to current therapy noted and no complains from the patient either.  Will continue current therapy and lifestyle modifications until next f/u visit in 8 weeks. No additional changes indicated until BMET results know. Plan to increase chlorthalidone and/or doxazosin if additional BP control needed.   Anastacio Bua Rodriguez-Guzman PharmD, [facility] [place] [id number] 11/25/2016 10:51 AM

## 2016-11-25 ENCOUNTER — Ambulatory Visit (INDEPENDENT_AMBULATORY_CARE_PROVIDER_SITE_OTHER): Payer: Medicare Other | Admitting: Pharmacist

## 2016-11-25 VITALS — BP 152/60 | HR 60

## 2016-11-25 DIAGNOSIS — I10 Essential (primary) hypertension: Secondary | ICD-10-CM | POA: Diagnosis not present

## 2016-11-25 DIAGNOSIS — E7801 Familial hypercholesterolemia: Secondary | ICD-10-CM

## 2016-11-25 LAB — BASIC METABOLIC PANEL
BUN/Creatinine Ratio: 20 (ref 12–28)
BUN: 20 mg/dL (ref 8–27)
CO2: 24 mmol/L (ref 20–29)
Calcium: 10.2 mg/dL (ref 8.7–10.3)
Chloride: 98 mmol/L (ref 96–106)
Creatinine, Ser: 0.98 mg/dL (ref 0.57–1.00)
GFR calc Af Amer: 65 mL/min/{1.73_m2} (ref >59)
GFR calc non Af Amer: 57 mL/min/{1.73_m2} — ABNORMAL LOW (ref >59)
Glucose: 103 mg/dL — ABNORMAL HIGH (ref 65–99)
Potassium: 3.9 mmol/L (ref 3.5–5.2)
Sodium: 139 mmol/L (ref 134–144)

## 2016-11-25 NOTE — Assessment & Plan Note (Signed)
LDL remains elevated above goal of < 70mg /dL for secondary prevention. Insurance pre-approval was obtained for Repatha 420mg  infusion on 08/11/2016. [place] paperwork also provided to patient to complete and help with affordability. She expressed concerned with potential side effects seen on Internet and TV commercial , therefore,  not interested on initiating any PCSK9 inhibitor.  We revisit the option for Repatha today 11/25/16 during pharmacist clinic visit but patient stated she still not "ready" to make that decision yet.

## 2016-11-25 NOTE — Assessment & Plan Note (Signed)
Blood pressure remains elevated today but showed some improvement from previous readings. BP at home appears controlled per patients records. She didn't took any medication this morning in preparation for blood work;  therefore, BP readings may be better 1-2 hours after medication administration. No adverse reaction to current therapy noted and no complains from the patient either.  Will continue current therapy and lifestyle modifications until next f/u visit in 8 weeks. No additional changes indicated until BMET results know. Plan to increase chlorthalidone and/or doxazosin if additional BP control needed.

## 2016-11-25 NOTE — Patient Instructions (Addendum)
Return for a follow up appointment in 8 weeks (take medication before appointment)  Your blood pressure today is 152/60 pulse 60   Check your blood pressure at home daily (if able) and keep record of the readings.  Take your BP meds as follows: **ALL medication as prescribed** Repeat blood work today  Bring all of your meds, your BP cuff and your record of home blood pressures to your next appointment.  Exercise as you're able, try to walk approximately 30 minutes per day.  Keep salt intake to a minimum, especially watch canned and prepared boxed foods.  Eat more fresh fruits and vegetables and fewer canned items.  Avoid eating in fast food restaurants.    HOW TO TAKE YOUR BLOOD PRESSURE: . Rest 5 minutes before taking your blood pressure. .  Don't smoke or drink caffeinated beverages for at least 30 minutes before. . Take your blood pressure before (not after) you eat. . Sit comfortably with your back supported and both feet on the floor (don't cross your legs). . Elevate your arm to heart level on a table or a desk. . Use the proper sized cuff. It should fit smoothly and snugly around your bare upper arm. There should be enough room to slip a fingertip under the cuff. The bottom edge of the cuff should be 1 inch above the crease of the elbow. . Ideally, take 3 measurements at one sitting and record the average.

## 2016-12-10 ENCOUNTER — Other Ambulatory Visit: Payer: Self-pay | Admitting: Internal Medicine

## 2016-12-15 ENCOUNTER — Other Ambulatory Visit: Payer: Self-pay | Admitting: Internal Medicine

## 2016-12-16 NOTE — Telephone Encounter (Signed)
REFILL 

## 2017-01-20 ENCOUNTER — Ambulatory Visit (INDEPENDENT_AMBULATORY_CARE_PROVIDER_SITE_OTHER): Payer: Medicare Other | Admitting: Pharmacist

## 2017-01-20 VITALS — BP 130/62 | HR 60 | Wt 150.0 lb

## 2017-01-20 DIAGNOSIS — I10 Essential (primary) hypertension: Secondary | ICD-10-CM | POA: Diagnosis not present

## 2017-01-20 NOTE — Progress Notes (Signed)
Patient ID: Shannon Scott                 DOB: 1940-07-03                      MRN: [id number]     HPI: Shannon Scott is a 76 y.o. female referred by Dr. Debara Pickett to HTN clinic. PMH includes CAD s/p CABG in 2005, TIA and carotid stenosis. Patient is reluctant to most medication changes. Patient reported increase low extremities edema with amlodipine 5mg  daily and it was discontinued on 10/21/16.  Doxazosin 2mg  was added at that time with minimal impact on blood pressure. Chlorthalidone 25mg  daily was added to her therapy by Dr Debara Pickett on 11/18/16 and patient repeat BMET on 11/25/16 showed   Patient presnets today for HTN follow up. Denies dizziness, pain, headaches or fatigue. Report significant improvement on her low extremities edema and home BP readings.  She stopped taking Ibuprofen per podiatrist recommendation and reports significant improvement on foot pain. Still eating out frequently for breakfast and lunch.; avoid beef and pork  Current HTN meds: Irbesartan 300mg  daily Chlorthalidone 25mg  daily Metoprolol succinate 50mg  daily  BP goal: 130/80  Family History: hypertension and heart disease form mother; sroke and heart disease from father; several siblings with hx of HTRN, MI, stroke and CAD  Social History: Denies tobacco and alcohol use  Diet:Has lost about 20 pounds over past year; doesn't each much fried foods, prefers grilled; cut back on sweets, cut out ice cream but does eat frozen treats at [employer] regularly   Home BP readings: none available  Wt Readings from Last 3 Encounters:  01/20/17 150 lb (68 kg)  11/18/16 151 lb 12.8 oz (68.9 kg)  11/16/16 154 lb (69.9 kg)   BP Readings from Last 3 Encounters:  01/20/17 130/62  11/25/16 (!) 152/60  11/18/16 (!) 170/86   Pulse Readings from Last 3 Encounters:  01/20/17 60  11/25/16 60  11/18/16 60    Past Medical History:  Diagnosis Date  . Arthritis   . Asthma    allergy induced  . Bilateral carotid  artery disease (HCC)    L carotid bruit  . CAD (coronary artery disease)   . Cancer ([place])   . Constipation   . Dyslipidemia    intolerant to statins, welchol, niacin, zetia  . History of blood transfusion   . History of nuclear stress test 04/24/2012   lexiscan; normal study  . Hypertension   . Hypothyroidism   . Postoperative nausea and vomiting 01/02/2016    Current Outpatient Prescriptions on File Prior to Visit  Medication Sig Dispense Refill  . aspirin EC 81 MG tablet Take 81 mg by mouth daily.    . chlorthalidone (HYGROTON) 25 MG tablet Take 1 tablet (25 mg total) by mouth daily. 30 tablet 5  . docusate sodium (COLACE) 100 MG capsule Take 100 mg by mouth daily.    Marland Kitchen ibuprofen (ADVIL) 200 MG tablet Take 200 mg by mouth every 6 (six) hours as needed for pain. Reported on 12/17/2015    . irbesartan (AVAPRO) 300 MG tablet take 1 tablet by mouth once daily 90 tablet 1  . levothyroxine (SYNTHROID, LEVOTHROID) 50 MCG tablet Take 50 mcg by mouth daily.     . metoprolol succinate (TOPROL-XL) 50 MG 24 hr tablet Take 50 mg by mouth daily.  0  . loratadine (CLARITIN) 10 MG tablet Take 10 mg by mouth daily as needed for allergies. Reported  on 12/17/2015     No current facility-administered medications on file prior to visit.     Allergies  Allergen Reactions  . Statins Other (See Comments)    Myalgias and memory problems  . Ciprofloxacin Itching    Splotchy redness with itching during IV infusion localized to arm.    Blood pressure 130/62, pulse 60, weight 150 lb (68 kg).  Essential hypertension Blood pressure well controlled today. No records available from home but patient states her systolic BP is never above 150s anymore. Issues with foot pain are resolved now . She still eating out frequently but is making better decision with food selection like avoiding ice-cream, less red meat, and less fried foods. Weight is also stable with 4lb drop in 2 months. Will continue current medication  management without changed and follow up as needed.     Philippe Gang Rodriguez-Guzman PharmD, BCPS, [place] [id number] 01/20/2017 8:49 PM

## 2017-01-20 NOTE — Assessment & Plan Note (Signed)
Blood pressure well controlled today. No records available from home but patient states her systolic BP is never above 150s anymore. Issues with foot pain are resolved now . She still eating out frequently but is making better decision with food selection like avoiding ice-cream, less red meat, and less fried foods. Weight is also stable with 4lb drop in 2 months. Will continue current medication management without changed and follow up as needed.

## 2017-01-20 NOTE — Patient Instructions (Addendum)
Return for a  follow up appointment as needed  Your blood pressure today is 130/62 pulse 60  Check your blood pressure at home daily (if able) and keep record of the readings.  Take your BP meds as follows: **ALL medication as prescribed**  Bring all of your meds, your BP cuff and your record of home blood pressures to your next appointment.  Exercise as you're able, try to walk approximately 30 minutes per day.  Keep salt intake to a minimum, especially watch canned and prepared boxed foods.  Eat more fresh fruits and vegetables and fewer canned items.  Avoid eating in fast food restaurants.    HOW TO TAKE YOUR BLOOD PRESSURE: . Rest 5 minutes before taking your blood pressure. .  Don't smoke or drink caffeinated beverages for at least 30 minutes before. . Take your blood pressure before (not after) you eat. . Sit comfortably with your back supported and both feet on the floor (don't cross your legs). . Elevate your arm to heart level on a table or a desk. . Use the proper sized cuff. It should fit smoothly and snugly around your bare upper arm. There should be enough room to slip a fingertip under the cuff. The bottom edge of the cuff should be 1 inch above the crease of the elbow. . Ideally, take 3 measurements at one sitting and record the average.

## 2017-05-10 ENCOUNTER — Ambulatory Visit: Payer: Medicare Other | Admitting: Internal Medicine

## 2017-06-03 ENCOUNTER — Ambulatory Visit: Payer: Medicare Other | Admitting: Internal Medicine

## 2017-06-03 ENCOUNTER — Encounter: Payer: Self-pay | Admitting: Internal Medicine

## 2017-06-03 VITALS — BP 156/72 | HR 79 | Ht 61.0 in | Wt 143.0 lb

## 2017-06-03 DIAGNOSIS — E7801 Familial hypercholesterolemia: Secondary | ICD-10-CM

## 2017-06-03 DIAGNOSIS — I10 Essential (primary) hypertension: Secondary | ICD-10-CM | POA: Diagnosis not present

## 2017-06-03 DIAGNOSIS — I350 Nonrheumatic aortic (valve) stenosis: Secondary | ICD-10-CM

## 2017-06-03 DIAGNOSIS — Z951 Presence of aortocoronary bypass graft: Secondary | ICD-10-CM | POA: Diagnosis not present

## 2017-06-03 NOTE — Progress Notes (Addendum)
OFFICE NOTE  Chief Complaint:  Follow-up dyslipidemia, HTN  Primary Care Physician: [name], MD  HPI:  Shannon Scott is a 77 year old female with severe combined dyslipidemia with CABG in 2005 who has been intolerant to statins, Welchol, niacin, Zetia, and other cholesterol-lower medications. In the past I had sent her to Dr. Moshe Cipro at [facility], who is a lipidologist, for evaluation for apheresis. However, due to the cost of close to $1000 a month she felt that was intolerable. Recently she was complaining of neck pain, underwent carotid Dopplers that you ordered, and those demonstrated 40% to 59% right internal carotid stenosis and a 60% to 79% left internal carotid stenosis. She does have a left carotid bruit. In addition, she is describing a band-like pressure around her chest which she attributed to her blood pressure medicine and then had actually discontinued that; however, the symptoms still persist. This is a little bit worse with exertion, does relieve with rest or taking blood pressure medicine, and is concerning for angina. She's also complaining of some left-sided numbness facial tingling which could be TIA. She has not been taking aspirin and she reports developing what sounds like petechiae on that, but is also not willing to take Plavix or other antiplatelet medications. She does also report pain center called for her body and her shoulders and her knees and her legs as well.  Shannon Scott returns today for follow-up. It's been about a year and a half since I seen her. She does have a history of multivessel coronary disease and prior bypass in 2005. Her last stress test was in 2013 which was negative for ischemia. An echo in 2008 showed probable mild aortic stenosis. She does have a murmur of aortic stenosis today which may be a little louder. Blood pressure is also poorly controlled today however she said she did not take her medicine. She says that she is taking  only a half of her herbs certain pill as it causes her more swelling if she takes the whole pill, despite the fact that contains a diuretic. She's been very intolerant to almost all medications including all of the statins and Zetia. No medicines of been tolerated and she continues to have a very high cholesterol over 300. I do think she be a good candidate for a PC SK 9 inhibitor.  Shannon Scott returns today for follow-up. She is recently started on BiDil and report some mild improvement in blood pressure although she feels when her blood pressure gets low that she is symptomatic. She may have some degree of lack of cerebral autoregulation due to long-standing hypertension. Unfortunate she's been intolerant to all medications. She's been tried on all of the statin medications, WelChol, niacin and steady and has been intolerant. I referred her to [facility] in 2013 for apheresis, however due to the very high cost this was never pursued. With the recent approval of PC SK 9 inhibitors, I think this would be a very good option for her. She has known ASCVD and FH based on her lipid profile. Her most recent cholesterol demonstrated total cholesterol 401, triglycerides 91, HDL 50, and LDL 333. Based on these findings her Dutch score is 8.  08/02/2016  Shannon Scott returns today for follow-up. Although she is asymptomatic, she still has significant untreated risk factors. Most notably she likely has FH. Her brother died recently suddenly, which is suspicious for cardiovascular disease. She does have 2 children, however who apparently do not have any significant cholesterol  abnormalities. She does also have a parent to had premature coronary artery disease. Her most recent LDL cholesterol was over 300 but has not been checked in a number of years. She's been intolerant to all statins, Zetia and WelChol. She previously been referred for lipoprotein apheresis however did not make that appointment due to the  cost.  11/18/2016  Shannon Scott was seen today again in follow-up. She underwent recent lipid testing which again is consistent with either FH or possibly an autosomal recessive LDL receptor defect or gain of function mutation and PCSK9. LDL particle numbers remain elevated at 2191, despite this she has an A pattern. Total cholesterol 333 with LDL-C 257 and HDL-C 56. Non-HDL cholesterol of 277. She is speaking with our pharmacist about starting Redpath but reports that she is not interested in medication. She also says she does not think she can afford the medication and can barely pay for house meds. Blood pressure remains not at goal today. She reports pain short of breath and having lower extremity swelling. Recently doxazosin was added at night - but she's not noted any significant difference in her blood pressure.  06/03/2017  Shannon Scott returns today for follow-up of hypertension and dyslipidemia.  As previously mentioned she has FH with significantly elevated LDL greater than 250 -given her premature CAD, family history of early CAD, very high LDL, apo B and LpA numbers, she may have HoFH.  She was approved for treatment with Repatha, however is afraid of needles and is not interested in that therapy.  There is potentially one other option for her and that would be Juxtapid. Clinical profile certainly fits HoFH.  This is the only oral therapy on the market for familial hyperlipidemia.  I believe this certainly could be an option for her, however it has its own side effect profile.  We will certainly need to monitor her liver function closely as specified through the [facility] proram.  Blood pressure is again noted to be elevated today.  She says this may be related to problems with a left hip bursitis.   PMHx:  Past Medical History:  Diagnosis Date  . Arthritis   . Asthma    allergy induced  . Bilateral carotid artery disease (HCC)    L carotid bruit  . CAD (coronary artery disease)   . Cancer ([facility])    . Constipation   . Dyslipidemia    intolerant to statins, welchol, niacin, zetia  . History of blood transfusion   . History of nuclear stress test 04/24/2012   lexiscan; normal study  . Hypertension   . Hypothyroidism   . Postoperative nausea and vomiting 01/02/2016    Past Surgical History:  Procedure Laterality Date  . ABDOMINAL HYSTERECTOMY    . Carotid Doppler  02/2012   40-59% right int carotid artery stenosis; 60-79% L int carotid stenosis; L carotid bruit  . CORONARY ARTERY BYPASS GRAFT  03/12/2004   LIMA to LAD, SVG to circumflex, SVG to PDA  . IR GENERIC HISTORICAL  04/29/2016   IR RADIOLOGIST EVAL & MGMT 04/29/2016 Sandi Mariscal, MD [facility] INTERV RAD  . IR GENERIC HISTORICAL  05/12/2016   IR RADIOLOGIST EVAL & MGMT 05/12/2016 Sandi Mariscal, MD [facility] INTERV RAD  . ROBOTIC ASSISTED TOTAL HYSTERECTOMY WITH BILATERAL SALPINGO OOPHERECTOMY Bilateral 12/30/2015   Procedure: XI ROBOTIC ASSISTED TOTAL HYSTERECTOMY WITH BILATERAL SALPINGO OOPHORECTOMY WITH SENTAL LYMPH NODE BIOPSY;  Surgeon: Nancy Marus, MD;  Location: [facility]S;  Service: Gynecology;  Laterality: Bilateral;  . TONSILLECTOMY    .  TRANSTHORACIC ECHOCARDIOGRAM  04/2007   EF>55%; mild MR; mild-mod TR; mild pulm HTN; mild calcification of aortiv valve leaflets with mild valvular aortic stenosis  . TUBAL LIGATION      FAMHx:  Family History  Problem Relation Age of Onset  . Hypertension Mother   . Heart disease Mother   . Stroke Father   . Heart disease Father   . Kidney disease Brother   . Heart disease Brother        also HTN, hyperlipidemia  . Heart attack Brother   . Stroke Sister        x2  . Hypertension Sister   . Heart disease Sister     SOCHx:   reports that  has never smoked. she has never used smokeless tobacco. She reports that she does not drink alcohol or use drugs.  ALLERGIES:  Allergies  Allergen Reactions  . Statins Other (See Comments)    Myalgias and memory problems  . Ciprofloxacin  Itching    Splotchy redness with itching during IV infusion localized to arm.    ROS: Pertinent items noted in HPI and remainder of comprehensive ROS otherwise negative.  HOME MEDS: Current Outpatient Medications  Medication Sig Dispense Refill  . aspirin EC 81 MG tablet Take 81 mg by mouth daily.    . Biotin 10 MG TABS Take 10 mg by mouth daily.    . chlorthalidone (HYGROTON) 25 MG tablet Take 1 tablet (25 mg total) by mouth daily. 30 tablet 5  . cholecalciferol (VITAMIN D) 1000 units tablet Take 1,000 Units by mouth daily.    Marland Kitchen docusate sodium (COLACE) 100 MG capsule Take 100 mg by mouth daily.    Marland Kitchen ibuprofen (ADVIL) 200 MG tablet Take 200 mg by mouth every 6 (six) hours as needed for pain. Reported on 12/17/2015    . irbesartan (AVAPRO) 300 MG tablet take 1 tablet by mouth once daily 90 tablet 1  . levothyroxine (SYNTHROID, LEVOTHROID) 50 MCG tablet Take 50 mcg by mouth daily.     Marland Kitchen loratadine (CLARITIN) 10 MG tablet Take 10 mg by mouth daily as needed for allergies. Reported on 12/17/2015    . metoprolol succinate (TOPROL-XL) 50 MG 24 hr tablet Take 50 mg by mouth daily.  0   No current facility-administered medications for this visit.     LABS/IMAGING: No results found for this or any previous visit (from the past 48 hour(s)). No results found.  VITALS: BP (!) 156/72   Pulse 79   Ht 5' 1"  (1.549 m)   Wt 143 lb (64.9 kg)   BMI 27.02 kg/m   EXAM: General appearance: alert and no distress Neck: no adenopathy, + left carotid bruit, no JVD, supple, symmetrical, trachea midline and thyroid not enlarged, symmetric, no tenderness/mass/nodules Lungs: clear to auscultation bilaterally Heart: regular rate and rhythm, S1, S2 normal, no murmur, click, rub or gallop Abdomen: soft, non-tender; bowel sounds normal; no masses,  no organomegaly Extremities: extremities normal, atraumatic, no cyanosis or edema Pulses: 2+ and symmetric Skin: Skin color, texture, turgor normal. No rashes or  lesions Neurologic: Grossly normal  EKG: Normal sinus rhythm at 79-personally reviewed  ASSESSMENT: 1. Familial hypercholesterolemia (possibly HoFH) with a [place] score of 8, also consider LDL adapter protein deficiency or GOF mutation in PCSK9 2. History of TIAs 3. Multidrug intolerance - including statins, welchol, zetia, etc, but also less than expected response to therapy  3.   CABG x 3 (2005) 4.   Known moderate to  severe bilateral carotid artery disease  5.   Uncontrolled hypertension 6.   Aortic stenosis (LPa - 123), ApoB of 175  PLAN: 1.   Shannon Scott has FH and has been intolerant to statins and has had a less than expected response to them in the past.  She is not interested in [facility] SK 9 inhibitors but was approved for Repatha.  She is concerned about needles and is requesting an oral alternative.  She may be a good candidate for Juxtapid.  I will go ahead and start the paperwork for that and help her determine what the cost may be for her. She will need baseline liver testing and monthly liver enzymes for at least the first year or with dose adjustments.  Plan follow-up with me in the lipid clinic in a couple of months.  Blood pressure remains elevated and she may need additional medication at that time.   Follow-up in 2 months.  Shannon Casino, MD, [facility], [place] Director of the [facility] Lipid Disorders &  Cardiovascular Risk Reduction Clinic Diplomate of the American Board of Clinical Lipidology Attending Cardiologist  Direct Dial: [phone]  Fax: [phone]  Website:  [email or web address] 06/03/2017, 4:32 PM

## 2017-06-03 NOTE — Patient Instructions (Addendum)
Dr. Debara Pickett recommends Shannon Scott  Your physician recommends that you schedule a follow-up appointment on August 01 2017 (Lipid Clinic).   Please have fasting lab work prior to your visit with Dr. Debara Pickett

## 2017-06-07 ENCOUNTER — Other Ambulatory Visit: Payer: Self-pay | Admitting: Internal Medicine

## 2017-06-10 ENCOUNTER — Telehealth: Payer: Self-pay | Admitting: Internal Medicine

## 2017-06-10 NOTE — Telephone Encounter (Signed)
Shannon Scott is calling because she is wanting someone to explain the EKG to her in which she had . Please call  Thanks

## 2017-06-10 NOTE — Telephone Encounter (Signed)
Returned call to patient advised Dr.Hilty reviewed 06/03/17 EKG normal sinus rhythm rate 79.

## 2017-08-01 ENCOUNTER — Ambulatory Visit: Payer: Medicare Other | Admitting: Internal Medicine

## 2017-08-16 ENCOUNTER — Telehealth: Payer: Self-pay | Admitting: Internal Medicine

## 2017-08-16 NOTE — Telephone Encounter (Signed)
New message  Patient last seen 06/03/2017     [facility] Pre-operative Risk Assessment    Request for surgical clearance:  1. What type of surgery is being performed? Colonoscopy   2. When is this surgery scheduled? 08/17/17  3. What type of clearance is required (medical clearance vs. Pharmacy clearance to hold med vs. Both)? Medical  4. Are there any medications that need to be held prior to surgery and how long? no  5. Practice name and name of physician performing surgery? [facility], Dr. Man  6. What is your office phone and fax number? [phone], fax: [phone]  7. Anesthesia type (None, local, MAC, general) ? Propafal   Nicholes Stairs 08/16/2017, 1:52 PM  _________________________________________________________________   (provider comments below)

## 2017-08-17 NOTE — Telephone Encounter (Signed)
Follow Up:      Please call asap,pt needs surgery tomorrow.

## 2017-08-17 NOTE — Telephone Encounter (Signed)
Follow up   Provider called back 08/17/17 , needs response for surgical clearance ASAP

## 2017-08-17 NOTE — Telephone Encounter (Signed)
   Primary Cardiologist: Dr. Debara Pickett  Chart reviewed as part of pre-operative protocol coverage. Given past medical history and time since last visit, based on ACC/AHA guidelines, ASLAN MONTAGNA would be at acceptable risk for the planned procedure without further cardiovascular testing.   Pt recently seen by Dr. Debara Pickett 05/2017 and stable from cardiac standpoint. Dr. Collene Mares called office directly today needing clearance ASAP for colonoscopy. Pt w/o chest pain. [place] for colonoscopy.   I will route this recommendation to the requesting party via Epic fax function and remove from pre-op pool.  Please call with questions.  Lyda Jester, PA-C 08/17/2017, 3:19 PM

## 2017-08-18 ENCOUNTER — Inpatient Hospital Stay (HOSPITAL_COMMUNITY)
Admission: EM | Admit: 2017-08-18 | Discharge: 2017-08-26 | DRG: 329 | Disposition: A | Discharge status: Skilled Nursing Facility | Payer: Medicare Other | Attending: Internal Medicine | Admitting: Internal Medicine

## 2017-08-18 ENCOUNTER — Emergency Department (HOSPITAL_COMMUNITY): Payer: Medicare Other

## 2017-08-18 ENCOUNTER — Encounter (HOSPITAL_COMMUNITY): Payer: Self-pay | Admitting: Emergency Medicine

## 2017-08-18 DIAGNOSIS — M1990 Unspecified osteoarthritis, unspecified site: Secondary | ICD-10-CM | POA: Diagnosis present

## 2017-08-18 DIAGNOSIS — I272 Pulmonary hypertension, unspecified: Secondary | ICD-10-CM | POA: Diagnosis present

## 2017-08-18 DIAGNOSIS — Z881 Allergy status to other antibiotic agents status: Secondary | ICD-10-CM

## 2017-08-18 DIAGNOSIS — Z8542 Personal history of malignant neoplasm of other parts of uterus: Secondary | ICD-10-CM

## 2017-08-18 DIAGNOSIS — I361 Nonrheumatic tricuspid (valve) insufficiency: Secondary | ICD-10-CM | POA: Diagnosis not present

## 2017-08-18 DIAGNOSIS — I6529 Occlusion and stenosis of unspecified carotid artery: Secondary | ICD-10-CM | POA: Diagnosis present

## 2017-08-18 DIAGNOSIS — K631 Perforation of intestine (nontraumatic): Secondary | ICD-10-CM | POA: Diagnosis not present

## 2017-08-18 DIAGNOSIS — C189 Malignant neoplasm of colon, unspecified: Principal | ICD-10-CM | POA: Diagnosis present

## 2017-08-18 DIAGNOSIS — K760 Fatty (change of) liver, not elsewhere classified: Secondary | ICD-10-CM | POA: Diagnosis present

## 2017-08-18 DIAGNOSIS — K639 Disease of intestine, unspecified: Secondary | ICD-10-CM | POA: Diagnosis not present

## 2017-08-18 DIAGNOSIS — I248 Other forms of acute ischemic heart disease: Secondary | ICD-10-CM | POA: Diagnosis present

## 2017-08-18 DIAGNOSIS — E876 Hypokalemia: Secondary | ICD-10-CM | POA: Diagnosis not present

## 2017-08-18 DIAGNOSIS — I252 Old myocardial infarction: Secondary | ICD-10-CM

## 2017-08-18 DIAGNOSIS — E871 Hypo-osmolality and hyponatremia: Secondary | ICD-10-CM | POA: Diagnosis not present

## 2017-08-18 DIAGNOSIS — E039 Hypothyroidism, unspecified: Secondary | ICD-10-CM | POA: Diagnosis present

## 2017-08-18 DIAGNOSIS — R319 Hematuria, unspecified: Secondary | ICD-10-CM | POA: Diagnosis not present

## 2017-08-18 DIAGNOSIS — Z6828 Body mass index (BMI) 28.0-28.9, adult: Secondary | ICD-10-CM

## 2017-08-18 DIAGNOSIS — I4891 Unspecified atrial fibrillation: Secondary | ICD-10-CM | POA: Diagnosis not present

## 2017-08-18 DIAGNOSIS — I083 Combined rheumatic disorders of mitral, aortic and tricuspid valves: Secondary | ICD-10-CM | POA: Diagnosis present

## 2017-08-18 DIAGNOSIS — J9811 Atelectasis: Secondary | ICD-10-CM | POA: Diagnosis present

## 2017-08-18 DIAGNOSIS — M7989 Other specified soft tissue disorders: Secondary | ICD-10-CM | POA: Diagnosis present

## 2017-08-18 DIAGNOSIS — R079 Chest pain, unspecified: Secondary | ICD-10-CM | POA: Diagnosis not present

## 2017-08-18 DIAGNOSIS — E669 Obesity, unspecified: Secondary | ICD-10-CM | POA: Diagnosis present

## 2017-08-18 DIAGNOSIS — R9431 Abnormal electrocardiogram [ECG] [EKG]: Secondary | ICD-10-CM | POA: Diagnosis not present

## 2017-08-18 DIAGNOSIS — K56600 Partial intestinal obstruction, unspecified as to cause: Secondary | ICD-10-CM

## 2017-08-18 DIAGNOSIS — Z9189 Other specified personal risk factors, not elsewhere classified: Secondary | ICD-10-CM

## 2017-08-18 DIAGNOSIS — I10 Essential (primary) hypertension: Secondary | ICD-10-CM | POA: Diagnosis present

## 2017-08-18 DIAGNOSIS — Z951 Presence of aortocoronary bypass graft: Secondary | ICD-10-CM | POA: Diagnosis not present

## 2017-08-18 DIAGNOSIS — K630 Abscess of intestine: Secondary | ICD-10-CM | POA: Diagnosis present

## 2017-08-18 DIAGNOSIS — D638 Anemia in other chronic diseases classified elsewhere: Secondary | ICD-10-CM | POA: Diagnosis present

## 2017-08-18 DIAGNOSIS — N131 Hydronephrosis with ureteral stricture, not elsewhere classified: Secondary | ICD-10-CM | POA: Diagnosis present

## 2017-08-18 DIAGNOSIS — G8929 Other chronic pain: Secondary | ICD-10-CM | POA: Diagnosis present

## 2017-08-18 DIAGNOSIS — N2882 Megaloureter: Secondary | ICD-10-CM

## 2017-08-18 DIAGNOSIS — K8020 Calculus of gallbladder without cholecystitis without obstruction: Secondary | ICD-10-CM | POA: Diagnosis present

## 2017-08-18 DIAGNOSIS — R402413 Glasgow coma scale score 13-15, at hospital admission: Secondary | ICD-10-CM | POA: Diagnosis present

## 2017-08-18 DIAGNOSIS — Z90710 Acquired absence of both cervix and uterus: Secondary | ICD-10-CM

## 2017-08-18 DIAGNOSIS — I739 Peripheral vascular disease, unspecified: Secondary | ICD-10-CM | POA: Diagnosis present

## 2017-08-18 DIAGNOSIS — R630 Anorexia: Secondary | ICD-10-CM | POA: Diagnosis present

## 2017-08-18 DIAGNOSIS — K6389 Other specified diseases of intestine: Secondary | ICD-10-CM

## 2017-08-18 DIAGNOSIS — J45909 Unspecified asthma, uncomplicated: Secondary | ICD-10-CM | POA: Diagnosis present

## 2017-08-18 DIAGNOSIS — E785 Hyperlipidemia, unspecified: Secondary | ICD-10-CM | POA: Diagnosis present

## 2017-08-18 DIAGNOSIS — I480 Paroxysmal atrial fibrillation: Secondary | ICD-10-CM | POA: Diagnosis present

## 2017-08-18 DIAGNOSIS — I482 Chronic atrial fibrillation, unspecified: Secondary | ICD-10-CM

## 2017-08-18 DIAGNOSIS — Z79899 Other long term (current) drug therapy: Secondary | ICD-10-CM

## 2017-08-18 DIAGNOSIS — Z7189 Other specified counseling: Secondary | ICD-10-CM

## 2017-08-18 DIAGNOSIS — K921 Melena: Secondary | ICD-10-CM | POA: Diagnosis present

## 2017-08-18 DIAGNOSIS — N281 Cyst of kidney, acquired: Secondary | ICD-10-CM | POA: Diagnosis present

## 2017-08-18 DIAGNOSIS — Z888 Allergy status to other drugs, medicaments and biological substances status: Secondary | ICD-10-CM

## 2017-08-18 DIAGNOSIS — I2510 Atherosclerotic heart disease of native coronary artery without angina pectoris: Secondary | ICD-10-CM | POA: Diagnosis present

## 2017-08-18 DIAGNOSIS — K5909 Other constipation: Secondary | ICD-10-CM | POA: Diagnosis present

## 2017-08-18 DIAGNOSIS — M7072 Other bursitis of hip, left hip: Secondary | ICD-10-CM | POA: Diagnosis present

## 2017-08-18 DIAGNOSIS — K449 Diaphragmatic hernia without obstruction or gangrene: Secondary | ICD-10-CM | POA: Diagnosis present

## 2017-08-18 DIAGNOSIS — E038 Other specified hypothyroidism: Secondary | ICD-10-CM | POA: Diagnosis not present

## 2017-08-18 LAB — COMPREHENSIVE METABOLIC PANEL
ALT: 12 U/L — ABNORMAL LOW (ref 14–54)
AST: 28 U/L (ref 15–41)
Albumin: 3.8 g/dL (ref 3.5–5.0)
Alkaline Phosphatase: 100 U/L (ref 38–126)
Anion gap: 14 (ref 5–15)
BUN: 6 mg/dL (ref 6–20)
CO2: 29 mmol/L (ref 22–32)
Calcium: 9.1 mg/dL (ref 8.9–10.3)
Chloride: 80 mmol/L — ABNORMAL LOW (ref 101–111)
Creatinine, Ser: 0.58 mg/dL (ref 0.44–1.00)
GFR calc Af Amer: >60 mL/min (ref >60)
GFR calc non Af Amer: >60 mL/min (ref >60)
Glucose, Bld: 123 mg/dL — ABNORMAL HIGH (ref 65–99)
Potassium: 2.1 mmol/L — CL (ref 3.5–5.1)
Sodium: 123 mmol/L — ABNORMAL LOW (ref 135–145)
Total Bilirubin: 0.4 mg/dL (ref 0.3–1.2)
Total Protein: 7.9 g/dL (ref 6.5–8.1)

## 2017-08-18 LAB — CBC
HCT: 32.4 % — ABNORMAL LOW (ref 36.0–46.0)
Hemoglobin: 10.7 g/dL — ABNORMAL LOW (ref 12.0–15.0)
MCH: 26.8 pg (ref 26.0–34.0)
MCHC: 33 g/dL (ref 30.0–36.0)
MCV: 81 fL (ref 78.0–100.0)
Platelets: 515 10*3/uL — ABNORMAL HIGH (ref 150–400)
RBC: 4 MIL/uL (ref 3.87–5.11)
RDW: 13.8 % (ref 11.5–15.5)
WBC: 9 10*3/uL (ref 4.0–10.5)

## 2017-08-18 LAB — MAGNESIUM: Magnesium: 1.8 mg/dL (ref 1.7–2.4)

## 2017-08-18 LAB — URINALYSIS, ROUTINE W REFLEX MICROSCOPIC
Bilirubin Urine: NEGATIVE
Glucose, UA: NEGATIVE mg/dL
Hgb urine dipstick: NEGATIVE
Ketones, ur: 20 mg/dL — AB
Leukocytes, UA: NEGATIVE
Nitrite: NEGATIVE
Protein, ur: NEGATIVE mg/dL
Specific Gravity, Urine: 1.006 (ref 1.005–1.030)
pH: 7 (ref 5.0–8.0)

## 2017-08-18 LAB — BASIC METABOLIC PANEL
Anion gap: 11 (ref 5–15)
BUN: 5 mg/dL — ABNORMAL LOW (ref 6–20)
CO2: 27 mmol/L (ref 22–32)
Calcium: 8.4 mg/dL — ABNORMAL LOW (ref 8.9–10.3)
Chloride: 91 mmol/L — ABNORMAL LOW (ref 101–111)
Creatinine, Ser: 0.65 mg/dL (ref 0.44–1.00)
GFR calc Af Amer: >60 mL/min (ref >60)
GFR calc non Af Amer: >60 mL/min (ref >60)
Glucose, Bld: 126 mg/dL — ABNORMAL HIGH (ref 65–99)
Potassium: 2.7 mmol/L — CL (ref 3.5–5.1)
Sodium: 129 mmol/L — ABNORMAL LOW (ref 135–145)

## 2017-08-18 LAB — I-STAT TROPONIN, ED: Troponin i, poc: 0.08 ng/mL (ref 0.00–0.08)

## 2017-08-18 LAB — I-STAT CG4 LACTIC ACID, ED
Lactic Acid, Venous: 0.97 mmol/L (ref 0.5–1.9)
Lactic Acid, Venous: 0.97 mmol/L (ref 0.5–1.9)

## 2017-08-18 LAB — MRSA PCR SCREENING: MRSA by PCR: NEGATIVE

## 2017-08-18 LAB — LIPASE, BLOOD: Lipase: 21 U/L (ref 11–51)

## 2017-08-18 MED ORDER — PANTOPRAZOLE SODIUM 40 MG IV SOLR
40.0000 mg | INTRAVENOUS | Status: DC
  Administered 2017-08-18 – 2017-08-24 (×7): 40 mg via INTRAVENOUS
  Filled 2017-08-18 (×7): qty 40

## 2017-08-18 MED ORDER — SODIUM CHLORIDE 0.9 % IV SOLN
INTRAVENOUS | Status: DC
  Administered 2017-08-18 – 2017-08-20 (×3): via INTRAVENOUS

## 2017-08-18 MED ORDER — SODIUM CHLORIDE 0.9 % IJ SOLN
INTRAMUSCULAR | Status: AC
  Filled 2017-08-18: qty 50

## 2017-08-18 MED ORDER — POTASSIUM CHLORIDE 10 MEQ/100ML IV SOLN
10.0000 meq | INTRAVENOUS | Status: AC
  Administered 2017-08-18 (×3): 10 meq via INTRAVENOUS
  Filled 2017-08-18 (×2): qty 100

## 2017-08-18 MED ORDER — FENTANYL CITRATE (PF) 100 MCG/2ML IJ SOLN
25.0000 ug | Freq: Once | INTRAMUSCULAR | Status: AC
  Administered 2017-08-18: 25 ug via INTRAVENOUS
  Filled 2017-08-18: qty 2

## 2017-08-18 MED ORDER — POTASSIUM CHLORIDE CRYS ER 20 MEQ PO TBCR
40.0000 meq | EXTENDED_RELEASE_TABLET | Freq: Once | ORAL | Status: DC
  Filled 2017-08-18: qty 2

## 2017-08-18 MED ORDER — MORPHINE SULFATE (PF) 2 MG/ML IV SOLN
2.0000 mg | Freq: Once | INTRAVENOUS | Status: AC
  Administered 2017-08-18: 2 mg via INTRAVENOUS
  Filled 2017-08-18: qty 1

## 2017-08-18 MED ORDER — POTASSIUM CHLORIDE 10 MEQ/100ML IV SOLN
10.0000 meq | INTRAVENOUS | Status: AC
  Administered 2017-08-18 (×2): 10 meq via INTRAVENOUS
  Filled 2017-08-18 (×3): qty 100

## 2017-08-18 MED ORDER — SODIUM CHLORIDE 0.9 % IV BOLUS (SEPSIS)
1000.0000 mL | Freq: Once | INTRAVENOUS | Status: AC
  Administered 2017-08-18: 1000 mL via INTRAVENOUS

## 2017-08-18 MED ORDER — ONDANSETRON 4 MG PO TBDP
4.0000 mg | ORAL_TABLET | Freq: Once | ORAL | Status: AC | PRN
  Administered 2017-08-18: 4 mg via ORAL
  Filled 2017-08-18: qty 1

## 2017-08-18 MED ORDER — MORPHINE SULFATE (PF) 4 MG/ML IV SOLN
4.0000 mg | INTRAVENOUS | Status: DC | PRN
  Administered 2017-08-18 – 2017-08-20 (×9): 4 mg via INTRAVENOUS
  Filled 2017-08-18 (×9): qty 1

## 2017-08-18 MED ORDER — LEVOTHYROXINE SODIUM 50 MCG PO TABS
50.0000 ug | ORAL_TABLET | Freq: Every day | ORAL | Status: DC
  Filled 2017-08-18: qty 1

## 2017-08-18 MED ORDER — MAGNESIUM SULFATE 2 GM/50ML IV SOLN
2.0000 g | Freq: Once | INTRAVENOUS | Status: AC
  Administered 2017-08-18: 2 g via INTRAVENOUS
  Filled 2017-08-18: qty 50

## 2017-08-18 MED ORDER — DILTIAZEM LOAD VIA INFUSION
10.0000 mg | Freq: Once | INTRAVENOUS | Status: DC
  Filled 2017-08-18: qty 10

## 2017-08-18 MED ORDER — DICLOFENAC SODIUM 1 % TD GEL
2.0000 g | Freq: Four times a day (QID) | TRANSDERMAL | Status: DC
  Administered 2017-08-23 – 2017-08-26 (×13): 2 g via TOPICAL
  Filled 2017-08-18 (×2): qty 100

## 2017-08-18 MED ORDER — DILTIAZEM HCL-DEXTROSE 100-5 MG/100ML-% IV SOLN (PREMIX)
5.0000 mg/h | INTRAVENOUS | Status: DC
  Filled 2017-08-18: qty 100

## 2017-08-18 MED ORDER — MORPHINE SULFATE (PF) 2 MG/ML IV SOLN: 2.0000 mg | Freq: Once | INTRAVENOUS | Status: DC

## 2017-08-18 MED ORDER — PIPERACILLIN-TAZOBACTAM 3.375 G IVPB 30 MIN
3.3750 g | Freq: Once | INTRAVENOUS | Status: AC
  Administered 2017-08-18: 3.375 g via INTRAVENOUS
  Filled 2017-08-18: qty 50

## 2017-08-18 MED ORDER — DILTIAZEM HCL-DEXTROSE 100-5 MG/100ML-% IV SOLN (PREMIX): 5.0000 mg/h | INTRAVENOUS | Status: DC

## 2017-08-18 MED ORDER — PIPERACILLIN-TAZOBACTAM 3.375 G IVPB
3.3750 g | Freq: Three times a day (TID) | INTRAVENOUS | Status: DC
  Administered 2017-08-18 – 2017-08-20 (×6): 3.375 g via INTRAVENOUS
  Administered 2017-08-21: 3.375 mg via INTRAVENOUS
  Administered 2017-08-21 – 2017-08-23 (×7): 3.375 g via INTRAVENOUS
  Filled 2017-08-18 (×12): qty 50

## 2017-08-18 MED ORDER — HYDRALAZINE HCL 20 MG/ML IJ SOLN
10.0000 mg | Freq: Four times a day (QID) | INTRAMUSCULAR | Status: DC | PRN
  Administered 2017-08-18: 10 mg via INTRAVENOUS
  Filled 2017-08-18: qty 1

## 2017-08-18 MED ORDER — IOPAMIDOL (ISOVUE-370) INJECTION 76%
100.0000 mL | Freq: Once | INTRAVENOUS | Status: AC | PRN
  Administered 2017-08-18: 100 mL via INTRAVENOUS

## 2017-08-18 MED ORDER — ONDANSETRON HCL 4 MG/2ML IJ SOLN
4.0000 mg | Freq: Four times a day (QID) | INTRAMUSCULAR | Status: DC | PRN
  Administered 2017-08-21: 4 mg via INTRAVENOUS

## 2017-08-18 MED ORDER — IOPAMIDOL (ISOVUE-370) INJECTION 76%
INTRAVENOUS | Status: AC
  Filled 2017-08-18: qty 100

## 2017-08-18 NOTE — ED Triage Notes (Signed)
Pt c/o left side abd pain for couple days with n/v and reports had small BM today. Pt denies any urinary problems.

## 2017-08-18 NOTE — ED Notes (Signed)
Will CCS PA, Gerkin MD CCS, and Hochrein MD was at bedside.  Family remains at bedside.

## 2017-08-18 NOTE — ED Provider Notes (Signed)
I was called into the room by the RN for a rapid heart rate.  This was noted after an NG tube was attempted to be placed.  Pulse was 166 and EKG showed an SVT.  I suggested Cardizem 10 mg bolus and 5 mg/h drip.  Will discuss with cardiology.   Nat Christen, MD 08/18/17 [phone]

## 2017-08-18 NOTE — H&P (Addendum)
History and Physical    ELEASE Scott RCV:[id number] DOB: 1940/06/06 DOA: 08/18/2017  PCP: [name], MD   Patient coming from: Home    Chief Complaint: Abdominal pain, nausea,vomiting  HPI: Shannon Scott is a 77 y.o. female with medical history significant of coronary artery disease status post CABG, endometrial cancer status post total hysterectomy, hypothyroidism who presented to the emergency department with complaints of generalized abdominal pain, nausea and vomiting that have worsened since last 2-3 days.Patient reported to have progressive abdominal pain, distention, nausea since last several weeks.  She reports loss of appetite and weight .  She had noticed some blood in her stool recently and was scheduled for colonoscopy with gastroenterology Dr. Collene Mares tomorrow.  Her last colonoscopy was about 10 years ago. Since last night her abdomen pain worsened.  She started having progressive abdominal distention and started having severe vomiting that brought her to the emergency department.  Patient also reported of having decreased urine output .  She was having chills at home but denies fever. Patient does not smoke or drink. Patient denies any chest pain, shortness of breath, dysuria, fever.  Patient was in severe abdominal pain during my evaluation.BP was stable.  ED Course: Patient was found to be hyponatremic and severely hypokalemic.  Blood pressure was elevated on presentation.  She went into brief episode of  A. fib with RVR.  EKG showed ST depression.  Cardiology was consulted. CT scan was done in the emergency department which showed heterogenous ill-defined mass involving the sigmoid colon in the left pelvis.  There is also suspicion for perforation with abscess formation.  Surgery consulted.  Review of Systems: As per HPI otherwise 10 point review of systems negative.    Past Medical History:  Diagnosis Date  . Arthritis   . Asthma    allergy induced  .  Bilateral carotid artery disease (HCC)    L carotid bruit  . CAD (coronary artery disease)   . Cancer ([place])   . Dyslipidemia    intolerant to statins, welchol, niacin, zetia  . History of blood transfusion   . History of nuclear stress test 04/24/2012   lexiscan; normal study  . Hypertension   . Hypothyroidism   . Postoperative nausea and vomiting 01/02/2016    Past Surgical History:  Procedure Laterality Date  . ABDOMINAL HYSTERECTOMY    . Carotid Doppler  02/2012   40-59% right int carotid artery stenosis; 60-79% L int carotid stenosis; L carotid bruit  . CORONARY ARTERY BYPASS GRAFT  03/12/2004   LIMA to LAD, SVG to circumflex, SVG to PDA  . IR GENERIC HISTORICAL  04/29/2016   IR RADIOLOGIST EVAL & MGMT 04/29/2016 Sandi Mariscal, MD [facility] INTER[facility] RAD  . IR GENERIC HISTORICAL  05/12/2016   IR RADIOLOGIST EVAL & MGMT 05/12/2016 Sandi Mariscal, MD [facility] INTER[facility] RAD  . ROBOTIC ASSISTED TOTAL HYSTERECTOMY WITH BILATERAL SALPINGO OOPHERECTOMY Bilateral 12/30/2015   Procedure: XI ROBOTIC ASSISTED TOTAL HYSTERECTOMY WITH BILATERAL SALPINGO OOPHORECTOMY WITH SENTAL LYMPH NODE BIOPSY;  Surgeon: Nancy Marus, MD;  Location: [facility];  Service: Gynecology;  Laterality: Bilateral;  . TONSILLECTOMY    . TRANSTHORACIC ECHOCARDIOGRAM  04/2007   EF>55%; mild MR; mild-mod TR; mild pulm HTN; mild calcification of aortiv valve leaflets with mild valvular aortic stenosis  . TUBAL LIGATION       reports that she has never smoked. She has never used smokeless tobacco. She reports that she does not drink alcohol or use drugs.  Allergies  Allergen Reactions  . Statins Other (See Comments)    Myalgias and memory problems  . Ciprofloxacin Itching    Splotchy redness with itching during IV infusion localized to arm.    Family History  Problem Relation Age of Onset  . Hypertension Mother   . Heart disease Mother        Died in her 29s  . Stroke Father   . Kidney disease Brother   . Heart disease Brother          also HTN, hyperlipidemia  . Heart attack Brother   . Stroke Sister        x2  . Hypertension Sister      Prior to Admission medications   Medication Sig Start Date End Date Taking? Authorizing Provider  acetaminophen (TYLENOL) 500 MG tablet Take 500 mg by mouth every 6 (six) hours as needed for mild pain, moderate pain, fever or headache.   Yes [provider]  chlorthalidone (HYGROTON) 25 MG tablet take 1 tablet by mouth once daily 06/07/17  Yes Hilty, Nadean Corwin, MD  levothyroxine (SYNTHROID, LEVOTHROID) 50 MCG tablet Take 50 mcg by mouth daily.    Yes [provider]  polyethylene glycol (MIRALAX / GLYCOLAX) packet Take 17 g by mouth daily as needed for mild constipation.   Yes [provider]  traMADol (ULTRAM) 50 MG tablet Take 50 mg by mouth every 12 (twelve) hours as needed for moderate pain.   Yes [provider]  irbesartan (AVAPRO) 300 MG tablet take 1 tablet by mouth once daily Patient not taking: Reported on 08/18/2017 12/16/16   Pixie Casino, MD    Physical Exam: Vitals:   08/18/17 1630 08/18/17 1635 08/18/17 1645 08/18/17 1700  BP: (!) 164/56 (!) 164/56 (!) 138/99 105/90  Pulse: 92 98 96 91  Resp: 18 14 (!) 24 (!) 21  Temp:      TempSrc:      SpO2: 96% 95% 93% 96%    Constitutional: NAD, calm, comfortable Vitals:   08/18/17 1630 08/18/17 1635 08/18/17 1645 08/18/17 1700  BP: (!) 164/56 (!) 164/56 (!) 138/99 105/90  Pulse: 92 98 96 91  Resp: 18 14 (!) 24 (!) 21  Temp:      TempSrc:      SpO2: 96% 95% 93% 96%   Eyes: PERRL, lids and conjunctivae normal ENMT: Mucous membranes are moist. Posterior pharynx clear of any exudate or lesions.Normal dentition.  Neck: normal, supple, no masses, no thyromegaly Respiratory: clear to auscultation bilaterally, no wheezing, no crackles. Normal respiratory effort. No accessory muscle use.  Cardiovascular: Regular rate and rhythm, systolic murmur present,No rubs or  Gallops. CABG  scar, Trace extremity edema. 2+ pedal pulses. No carotid bruits.  Abdomen: generalized  tenderness more on the left lower quadrant, no masses palpated. No hepatosplenomegaly. Bowel sounds positive.  Musculoskeletal: no clubbing / cyanosis. No joint deformity upper and lower extremities. Good ROM, no contractures. Normal muscle tone.  Skin: no rashes, lesions, ulcers. No induration Neurologic: CN 2-12 grossly intact. Sensation intact, DTR normal. Strength 5/5 in all 4.  Psychiatric: Normal judgment and insight. Alert and oriented x 3. Normal mood.   Labs on Admission: I have personally reviewed following labs and imaging studies  CBC: Recent Labs  Lab 08/18/17 1142  WBC 9.0  HGB 10.7*  HCT 32.4*  MCV 81.0  PLT 425*   Basic Metabolic Panel: Recent Labs  Lab 08/18/17 1142 08/18/17 1255  NA 123*  --   K  2.1*  --   CL 80*  --   CO2 29  --   GLUCOSE 123*  --   BUN 6  --   CREATININE 0.58  --   CALCIUM 9.1  --   MG  --  1.8   GFR: CrCl cannot be calculated (Unknown ideal weight.). Liver Function Tests: Recent Labs  Lab 08/18/17 1142  AST 28  ALT 12*  ALKPHOS 100  BILITOT 0.4  PROT 7.9  ALBUMIN 3.8   Recent Labs  Lab 08/18/17 1142  LIPASE 21   No results for input(s): AMMONIA in the last 168 hours. Coagulation Profile: No results for input(s): INR, PROTIME in the last 168 hours. Cardiac Enzymes: No results for input(s): CKTOTAL, CKMB, CKMBINDEX, TROPONINI in the last 168 hours. BNP (last 3 results) No results for input(s): PROBNP in the last 8760 hours. HbA1C: No results for input(s): HGBA1C in the last 72 hours. CBG: No results for input(s): GLUCAP in the last 168 hours. Lipid Profile: No results for input(s): CHOL, HDL, LDLCALC, TRIG, CHOLHDL, LDLDIRECT in the last 72 hours. Thyroid Function Tests: No results for input(s): TSH, T4TOTAL, FREET4, T3FREE, THYROIDAB in the last 72 hours. Anemia Panel: No results for input(s): VITAMINB12, FOLATE, FERRITIN,  TIBC, IRON, RETICCTPCT in the last 72 hours. Urine analysis:    Component Value Date/Time   COLORURINE YELLOW 08/18/2017 1410   APPEARANCEUR CLEAR 08/18/2017 1410   LABSPEC 1.006 08/18/2017 1410   LABSPEC 1.005 04/14/2016 1230   PHURINE 7.0 08/18/2017 1410   GLUCOSEU NEGATIVE 08/18/2017 1410   GLUCOSEU Negative 04/14/2016 1230   HGBUR NEGATIVE 08/18/2017 1410   BILIRUBINUR NEGATIVE 08/18/2017 1410   BILIRUBINUR Negative 04/14/2016 1230   KETONESUR 20 (A) 08/18/2017 1410   PROTEINUR NEGATIVE 08/18/2017 1410   UROBILINOGEN 0.2 04/14/2016 1230   NITRITE NEGATIVE 08/18/2017 1410   LEUKOCYTESUR NEGATIVE 08/18/2017 1410   LEUKOCYTESUR Trace 04/14/2016 1230    Radiological Exams on Admission: Ct Angio Abd/pel W And/or Wo Contrast  Result Date: 08/18/2017 CLINICAL DATA:  Vomiting.  Abdominal pain. EXAM: CTA ABDOMEN AND PELVIS wITHOUT AND WITH CONTRAST TECHNIQUE: Multidetector CT imaging of the abdomen and pelvis was performed using the standard protocol during bolus administration of intravenous contrast. Multiplanar reconstructed images and MIPs were obtained and reviewed to evaluate the vascular anatomy. CONTRAST:  122m ISOVUE-370 IOPAMIDOL (ISOVUE-370) INJECTION 76% COMPARISON:  04/15/2016 FINDINGS: VASCULAR Aorta: There are atherosclerotic calcifications and irregular plaque in the abdominal aorta without aneurysmal dilatation or significant luminal narrowing. There is a penetrating ulcer in the right posterolateral aorta at the level of the renal arteries. Celiac: Patent. SMA: Patent. Renals: Single right and 2 left renal arteries are patent. IMA: Patent. Inflow: Diffuse atherosclerotic calcifications in the bilateral common iliac arteries without significant narrowing. Internal and external iliac arteries are patent with scattered atherosclerotic changes. Proximal Outflow: Grossly patent. Review of the MIP images confirms the above findings. NON-VASCULAR Lower chest: Dependent atelectasis.  Hepatobiliary: Diffuse hepatic steatosis. Gallbladder is distended. A few small layering gallstones are noted Pancreas: Unremarkable Spleen: Unremarkable Adrenals/Urinary Tract: Moderate left hydronephrosis. Left hydroureter. Simple cyst in the right kidney. Adrenal glands are within normal limits. The left ureter is dilated to the left hemipelvis. It becomes narrowed in the inflammatory pelvic mass. This is described below. Bladder is within normal limits. Stomach/Bowel: Large hiatal hernia is unchanged. Stable prominent gastric folds within the hiatal hernia. The colon is diffusely distended. Distal small bowel is distended. A transition point between dilated large bowel and decompressed  large bowel occurs in the sigmoid colon. There is a heterogeneous ill-defined mass involving the sigmoid colon and left side of the pelvis which includes the colon wall, adjacent fat, and both fluid and gas elements. Findings are suspicious for a focal perforation with abscess formation. Underlying malignancy is a strong consideration given the appearance and colon obstruction. The mass also involves the left ureter causing an element of left ureteral dilatation worrisome for an element of left ureteral obstruction. The gas and fluid collection is very small measuring 1.8 x 1.2 cm on image 109 of series 4. Overall mass size including the involved colon is 5.7 x 5.3 cm. Normal appendix. Lymphatic: Several left iliac and left lower quadrant mesenteric lymph nodes are identified. The largest on image 92 measures 0.9 cm in short axis diameter. Reproductive: Post hysterectomy. The right ovary is not identified. A mass is present in the left adnexa as described above. Other: Noncontributory. Musculoskeletal: No vertebral compression. IMPRESSION: VASCULAR Atherosclerotic changes without obstruction. NON-VASCULAR There is a mass in the left hemipelvis involving the sigmoid colon and left pelvic soft tissues resulting in colon obstruction  and left ureteral obstruction. The mass does contain a focal gas and fluid collection consistent with perforation and abscess formation. It is small measuring up to 1.8 cm. Cholelithiasis. Electronically Signed   By: Marybelle Killings M.D.   On: 08/18/2017 15:12     Assessment/Plan Principal Problem:   Colonic mass Active Problems:   Hypothyroid   Essential hypertension   S/P CABG x 3   Hyponatremia   Acute hypokalemia   Bowel perforation (HCC)   Ureteral dilatation  Colonic mass: Surgery following.  Likely she needs colonic resection.  High likelihood of malignancy.   Patient has history of endometrial cancer and is status post total hysterectomy in September 2017.  Bowel perforation/abscess :CT scan also reported focal gas and fluid collection consistent with perforation and abscess formation.  Started on Zosyn.  Patient is afebrile.  We will follow-up cultures. Continue pain management.  Ureteral dilation: Imaging showed dilation of left ureter secondary to colonic mass.  Urology consulted by emergency department.  She might need ureteral stents before surgery.    Hyponatremia: We will continue to monitor sodium level.  Start on normal saline.  Hypokalemia: Continue aggressive repletion.  We will continue to monitor the levels  Coronary artery disease: Denies any chest pain at present.  Status post CABG.  A. fib with RVR: Most likely triggered by metabolic derangements, electrolyte imbalance.  Cardiology following.  Currently she is normal sinus rhythm.  Hypothyroidism: Continue Synthyroid  HTN: Currently blood pressure is on the lower side.  Patient is on chlorthalidone and Irbesartan at home  at home,which are on hold.   Severity of Illness:   I certify that at the point of admission it is my clinical judgment that the patient will require inpatient hospital care spanning beyond 2 midnights from the point of admission due to high intensity of service, high risk for further  deterioration and high frequency of surveillance required.    DVT prophylaxis: SCD Code Status: Full Family Communication: Son and daughter present at the bedside Consults called: General surgery, cardiology, urology     Shelly Coss MD [facility] Pager [phone]  If 7PM-7AM, please contact night-coverage [email or web address] [facility]  08/18/2017, 5:27 PM
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
## 2017-08-18 NOTE — ED Notes (Signed)
Pt returned from CT and CT reported that pt was not feeling fell, stating pt has c/o chest pressure. Went to pt's room to assess the pt who sts she feels like her heart is racing and she is having chest heaviness. Pt placed on monitor, HR between 135-145, and irregular. EKG was done and shown to Dr Ellender Hose. Dr Ellender Hose at the bedside to reevaluate the pt. Repeat EKG also shown to Dr Ellender Hose. Pt is much calmer at this time, however HR still btwn 100-110. Pt is currently using bedpan, per her insistence; as soon as finished posterior EKG to be performed, per VO.

## 2017-08-18 NOTE — ED Notes (Signed)
Pt's sats decreased to 79% RA after morphine was administered for pain as ordered.  Placed pt on 2L O2 [place]

## 2017-08-18 NOTE — Consult Note (Addendum)
Reason for Consult: Referring Physician: Crissie Reese Chief complaint 2 days of nausea and vomiting with left-sided abdominal pain  Shannon Scott is an 77 y.o. female.    HPI: 77 year old female with a history of endometrial cancer.   She has had left sided pain and was being treated for bursitis.  She got some solumedrol injections that helped for a couple days.  She also had some blood with her stools and was undergoing a bowel prep with Golytely over the last 2 days, followed by Dr. Meriel Pica.. She has been having trouble with nausea and developed on bowel prep, and developed severe acute abdominal pain today.   She was sent to the ED by [facility] but no records are currently available for that.  On arrival she was hypertensive with a blood pressure of 202/90.  She was afebrile, and having acute left lower quadrant pain.    Lab shows a sodium of 123, potassium of 2.1, chloride of 80, glucose of 123.  LFTs were normal.  Lipase 21.  White count 9.0.  Hemoglobin 10, hematocrit 32.4, platelets 515,000.  Urinalysis is unremarkable.  CT scan with and without contrast was obtained.  Shows lower chest dependent atelectasis, diffuse hepatic steatosis the gallbladder is distended with a few gallstones.  Moderate left hydronephrosis and left hydroureter, right renal cyst adrenals were normal.  Left ureter is dilated to the hemipelvis it becomes narrowed and the inflammatory pelvic mass.  Bladder was normal.  There is a large hiatal hernia and is unchanged.  The colon is diffusely distended all bowels distended transition point between dilated large bowel and decompressed bowel in the sigmoid colon.  There is a heterogeneous ill-defined mass involving the sigmoid colon in the left pelvis which includes the colon wall, adjacent fat, and both fluid and gas elements.  This is suspicious for perforation with abscess formation.  Final diagnosis was mass in the left hemipelvis/sigmoid colon with colon  obstruction, left ureteral obstruction;  consistent with perforation and abscess formation.  The abscess is about 1.8 cm. Telemetry currently shows tachycardia/AF and EKG shows ST depression.    We are asked to see.   Past Medical History:  Diagnosis Date  . Arthritis   . Asthma    allergy induced  . Bilateral carotid artery disease (HCC)    L carotid bruit  . CAD (coronary artery disease)   . Cancer ([place] [place])   . Constipation   . Dyslipidemia    intolerant to statins, welchol, niacin, zetia  . History of blood transfusion   . History of nuclear stress test 04/24/2012   lexiscan; normal study  . Hypertension   . Hypothyroidism   . Postoperative nausea and vomiting 01/02/2016    Past Surgical History:  Procedure Laterality Date  . ABDOMINAL HYSTERECTOMY    . Carotid Doppler  02/2012   40-59% right int carotid artery stenosis; 60-79% L int carotid stenosis; L carotid bruit  . CORONARY ARTERY BYPASS GRAFT  03/12/2004   LIMA to LAD, SVG to circumflex, SVG to PDA  . IR GENERIC HISTORICAL  04/29/2016   IR RADIOLOGIST EVAL & MGMT 04/29/2016 Sandi Mariscal, MD [facility] INTERV RAD  . IR GENERIC HISTORICAL  05/12/2016   IR RADIOLOGIST EVAL & MGMT 05/12/2016 Sandi Mariscal, MD [facility] INTERV RAD  . ROBOTIC ASSISTED TOTAL HYSTERECTOMY WITH BILATERAL SALPINGO OOPHERECTOMY Bilateral 12/30/2015   Procedure: XI ROBOTIC ASSISTED TOTAL HYSTERECTOMY WITH BILATERAL SALPINGO OOPHORECTOMY WITH SENTAL LYMPH NODE BIOPSY;  Surgeon: Nancy Marus, MD;  Location: [facility];  Service: Gynecology;  Laterality: Bilateral;  . TONSILLECTOMY    . TRANSTHORACIC ECHOCARDIOGRAM  04/2007   EF>55%; mild MR; mild-mod TR; mild pulm HTN; mild calcification of aortiv valve leaflets with mild valvular aortic stenosis  . TUBAL LIGATION      Family History  Problem Relation Age of Onset  . Hypertension Mother   . Heart disease Mother   . Stroke Father   . Heart disease Father   . Kidney disease Brother   . Heart disease Brother         also HTN, hyperlipidemia  . Heart attack Brother   . Stroke Sister        x2  . Hypertension Sister   . Heart disease Sister     Social History:  reports that she has never smoked. She has never used smokeless tobacco. She reports that she does not drink alcohol or use drugs.   ETOH:  None Tobacco:  None Drugs:  None  Retired Marine scientist at [employer]  Allergies:  Allergies  Allergen Reactions  . Statins Other (See Comments)    Myalgias and memory problems  . Ciprofloxacin Itching    Splotchy redness with itching during IV infusion localized to arm.    Prior to Admission medications   Medication Sig Start Date End Date Taking? Authorizing Provider  acetaminophen (TYLENOL) 500 MG tablet Take 500 mg by mouth every 6 (six) hours as needed for mild pain, moderate pain, fever or headache.   Yes [provider]  chlorthalidone (HYGROTON) 25 MG tablet take 1 tablet by mouth once daily 06/07/17  Yes Hilty, Nadean Corwin, MD  levothyroxine (SYNTHROID, LEVOTHROID) 50 MCG tablet Take 50 mcg by mouth daily.    Yes [provider]  polyethylene glycol (MIRALAX / GLYCOLAX) packet Take 17 g by mouth daily as needed for mild constipation.   Yes [provider]  traMADol (ULTRAM) 50 MG tablet Take 50 mg by mouth every 12 (twelve) hours as needed for moderate pain.   Yes [provider]  irbesartan (AVAPRO) 300 MG tablet take 1 tablet by mouth once daily Patient not taking: Reported on 08/18/2017 12/16/16   Pixie Casino, MD     Results for orders placed or performed during the hospital encounter of 08/18/17 (from the past 48 hour(s))  Lipase, blood     Status: None   Collection Time: 08/18/17 11:42 AM  Result Value Ref Range   Lipase 21 11 - 51 U/L    Comment: Performed at [facility], [place] [address], [place], [place] [address]  Comprehensive metabolic panel     Status: Abnormal   Collection Time: 08/18/17 11:42 AM  Result Value Ref Range    Sodium 123 (L) 135 - 145 mmol/L   Potassium 2.1 (LL) 3.5 - 5.1 mmol/L    Comment: CRITICAL RESULT CALLED TO, READ BACK BY AND VERIFIED WITH: A.KHALID AT 1230 ON 08/18/17 BY N.THOMPSON    Chloride 80 (L) 101 - 111 mmol/L   CO2 29 22 - 32 mmol/L   Glucose, Bld 123 (H) 65 - 99 mg/dL   BUN 6 6 - 20 mg/dL   Creatinine, Ser 0.58 0.44 - 1.00 mg/dL   Calcium 9.1 8.9 - 10.3 mg/dL   Total Protein 7.9 6.5 - 8.1 g/dL   Albumin 3.8 3.5 - 5.0 g/dL   AST 28 15 - 41 U/L   ALT 12 (L) 14 - 54 U/L   Alkaline Phosphatase 100 38 -  126 U/L   Total Bilirubin 0.4 0.3 - 1.2 mg/dL   GFR calc non Af Amer >60 >60 mL/min   GFR calc Af Amer >60 >60 mL/min    Comment: (NOTE) The eGFR has been calculated using the CKD EPI equation. This calculation has not been validated in all clinical situations. eGFR's persistently <60 mL/min signify possible Chronic Kidney Disease.    Anion gap 14 5 - 15    Comment: Performed at [facility], [place] [address], [place], [place] [address]  CBC     Status: Abnormal   Collection Time: 08/18/17 11:42 AM  Result Value Ref Range   WBC 9.0 4.0 - 10.5 K/uL   RBC 4.00 3.87 - 5.11 MIL/uL   Hemoglobin 10.7 (L) 12.0 - 15.0 g/dL   HCT 32.4 (L) 36.0 - 46.0 %   MCV 81.0 78.0 - 100.0 fL   MCH 26.8 26.0 - 34.0 pg   MCHC 33.0 30.0 - 36.0 g/dL   RDW 13.8 11.5 - 15.5 %   Platelets 515 (H) 150 - 400 K/uL    Comment: Performed at [facility], [place] [address] [place], [place] [address]  Magnesium     Status: None   Collection Time: 08/18/17 12:55 PM  Result Value Ref Range   Magnesium 1.8 1.7 - 2.4 mg/dL    Comment: Performed at [facility], [place] [address] [place], [place] [address]  I-Stat CG4 Lactic Acid, ED     Status: None   Collection Time: 08/18/17  1:17 PM  Result Value Ref Range   Lactic Acid, Venous 0.97 0.5 - 1.9 mmol/L  Urinalysis, Routine w reflex microscopic     Status: Abnormal   Collection Time: 08/18/17  2:10 PM   Result Value Ref Range   Color, Urine YELLOW YELLOW   APPearance CLEAR CLEAR   Specific Gravity, Urine 1.006 1.005 - 1.030   pH 7.0 5.0 - 8.0   Glucose, UA NEGATIVE NEGATIVE mg/dL   Hgb urine dipstick NEGATIVE NEGATIVE   Bilirubin Urine NEGATIVE NEGATIVE   Ketones, ur 20 (A) NEGATIVE mg/dL   Protein, ur NEGATIVE NEGATIVE mg/dL   Nitrite NEGATIVE NEGATIVE   Leukocytes, UA NEGATIVE NEGATIVE    Comment: Performed at [facility] [place] [address] [place], [place] [address]    Ct Angio Abd/pel W And/or Wo Contrast  Result Date: 08/18/2017 CLINICAL DATA:  Vomiting.  Abdominal pain. EXAM: CTA ABDOMEN AND PELVIS wITHOUT AND WITH CONTRAST TECHNIQUE: Multidetector CT imaging of the abdomen and pelvis was performed using the standard protocol during bolus administration of intravenous contrast. Multiplanar reconstructed images and MIPs were obtained and reviewed to evaluate the vascular anatomy. CONTRAST:  120m ISOVUE-370 IOPAMIDOL (ISOVUE-370) INJECTION 76% COMPARISON:  04/15/2016 FINDINGS: VASCULAR Aorta: There are atherosclerotic calcifications and irregular plaque in the abdominal aorta without aneurysmal dilatation or significant luminal narrowing. There is a penetrating ulcer in the right posterolateral aorta at the level of the renal arteries. Celiac: Patent. SMA: Patent. Renals: Single right and 2 left renal arteries are patent. IMA: Patent. Inflow: Diffuse atherosclerotic calcifications in the bilateral common iliac arteries without significant narrowing. Internal and external iliac arteries are patent with scattered atherosclerotic changes. Proximal Outflow: Grossly patent. Review of the MIP images confirms the above findings. NON-VASCULAR Lower chest: Dependent atelectasis. Hepatobiliary: Diffuse hepatic steatosis. Gallbladder is distended. A few small layering gallstones are noted Pancreas: Unremarkable Spleen: Unremarkable Adrenals/Urinary Tract: Moderate left hydronephrosis.  Left hydroureter. Simple cyst in the  right kidney. Adrenal glands are within normal limits. The left ureter is dilated to the left hemipelvis. It becomes narrowed in the inflammatory pelvic mass. This is described below. Bladder is within normal limits. Stomach/Bowel: Large hiatal hernia is unchanged. Stable prominent gastric folds within the hiatal hernia. The colon is diffusely distended. Distal small bowel is distended. A transition point between dilated large bowel and decompressed large bowel occurs in the sigmoid colon. There is a heterogeneous ill-defined mass involving the sigmoid colon and left side of the pelvis which includes the colon wall, adjacent fat, and both fluid and gas elements. Findings are suspicious for a focal perforation with abscess formation. Underlying malignancy is a strong consideration given the appearance and colon obstruction. The mass also involves the left ureter causing an element of left ureteral dilatation worrisome for an element of left ureteral obstruction. The gas and fluid collection is very small measuring 1.8 x 1.2 cm on image 109 of series 4. Overall mass size including the involved colon is 5.7 x 5.3 cm. Normal appendix. Lymphatic: Several left iliac and left lower quadrant mesenteric lymph nodes are identified. The largest on image 92 measures 0.9 cm in short axis diameter. Reproductive: Post hysterectomy. The right ovary is not identified. A mass is present in the left adnexa as described above. Other: Noncontributory. Musculoskeletal: No vertebral compression. IMPRESSION: VASCULAR Atherosclerotic changes without obstruction. NON-VASCULAR There is a mass in the left hemipelvis involving the sigmoid colon and left pelvic soft tissues resulting in colon obstruction and left ureteral obstruction. The mass does contain a focal gas and fluid collection consistent with perforation and abscess formation. It is small measuring up to 1.8 cm. Cholelithiasis. Electronically  Signed   By: Marybelle Killings M.D.   On: 08/18/2017 15:12    Review of Systems  Constitutional: Positive for weight loss. Negative for chills and fever.  Eyes: Negative.   Respiratory: Positive for shortness of breath (some walking ). Negative for cough, hemoptysis, sputum production and wheezing.   Cardiovascular: Positive for palpitations and leg swelling (left leg some swelling per family). Negative for chest pain and orthopnea.  Gastrointestinal: Positive for abdominal pain, blood in stool, constipation (she has had some constipation with some blood in her stools, and soft stools, she has been of GoLytely prep for 2 days), heartburn, nausea and vomiting.  Genitourinary: Negative.        Some trouble voiding last 2 days  Musculoskeletal:       Pt has been treated for left hip pain for a month, she attributed pain to this.  Skin: Negative.   Neurological: Positive for headaches.  Endo/Heme/Allergies: Negative.   Psychiatric/Behavioral: The patient is nervous/anxious.    Blood pressure 138/72, pulse (!) 127, temperature (!) 97.5 F (36.4 C), temperature source Oral, resp. rate 20, SpO2 97 %. Physical Exam  Constitutional: She is oriented to person, place, and time. She appears distressed.  Acutely ill female with significant abdominal pain,tachycardia, and slightly elevated BP BP (!) 164/56   Pulse 98   Temp (!) 97.5 F (36.4 C) (Oral)   Resp 14   SpO2 95%   HENT:  Head: Normocephalic and atraumatic.  Mouth/Throat: No oropharyngeal exudate.  Dry mucosa  Eyes: Right eye exhibits no discharge. Left eye exhibits no discharge. No scleral icterus.  Pupils are equal - small after fentanyl  Neck: Normal range of motion. Neck supple. No JVD present. No tracheal deviation present. No thyromegaly present.  Cardiovascular: Normal rate, regular rhythm and intact distal  pulses.  Murmur (aortic stenosis grade 2) heard. Respiratory: Effort normal and breath sounds normal. No respiratory distress.  She has no wheezes. She has no rales. She exhibits no tenderness.  median sternotomy incision well healed  GI: Soft. Bowel sounds are normal. She exhibits distension. She exhibits no mass. There is tenderness (tender all over but main pain is in the LLQ). There is rebound. There is no guarding.  Musculoskeletal: She exhibits edema (trace on left, ).  Lymphadenopathy:    She has no cervical adenopathy.  Neurological: She is alert and oriented to person, place, and time. No cranial nerve deficit.  Skin: Skin is warm and dry. No rash noted. She is not diaphoretic. No erythema. There is pallor.  Psychiatric: She has a normal mood and affect. Her behavior is normal. Judgment and thought content normal.    Assessment/Plan: History of endometrial cancer History of left carotid disease Hypertension Hypothyroid Hyperlipidemia  Obstructing left colon mass with probable perforation and abscess Chronic constipation Left ureter obstruction secondary to the mass.  Atrial fibrillation with ST depression Hyponatremia Hypokalemia Coronary artery disease  Plan:  She needs resuscitation tonight, she is being seen by Cardiology. She has been started on Zosyn,and will need to be stabilized.  She will need urology to see and place stents prior to surgery also.    She will need surgery, but not before she is stable.  If she has more nausea I would place an NG to keep her as decompressed as possible. Keep Her NPO.  I will get Ostomy nurse to mark her tomorrow for possible ostomy.  We will follow with you and see in the AM.  Troi Florendo 08/18/2017, 3:45 PM

## 2017-08-18 NOTE — ED Provider Notes (Signed)
[facility] DEPT Provider Note   CSN: [id number] Arrival date & time: 08/18/17  1129     History   Chief Complaint Chief Complaint  Patient presents with  . Abdominal Pain  . Emesis    HPI [facility] is a 77 y.o. female.  HPI  77 year old female with past medical history of coronary disease, endometrial cancer status post hysterectomy, here with generalized abdominal pain.  Patient states that for the last several weeks, she had progressive worsening abdominal pain, distention, nausea, and vomiting.  Over the last several days, this has acutely worsened.  She has had difficulty eating and drinking and everything she is drinking comes right back up.  She has seen GI for this and has a colonoscopy scheduled for tomorrow.  She sees Dr. Collene Mares.  She reports that since starting the colonoscopy prep, she is had progressive worsening diffuse abdominal pain, distention, and vomiting.  She is had generalized abdominal cramping.  No fevers.  She is also had intermittent palpitations.  No chest pain or shortness of breath.  No other medical complaints.  Pain is worse with any attempted eating or palpation.  No relieving factors.  Past Medical History:  Diagnosis Date  . Arthritis   . Asthma    allergy induced  . Bilateral carotid artery disease (HCC)    L carotid bruit  . CAD (coronary artery disease)   . Cancer ([place])   . Dyslipidemia    intolerant to statins, welchol, niacin, zetia  . History of blood transfusion   . History of nuclear stress test 04/24/2012   lexiscan; normal study  . Hypertension   . Hypothyroidism   . Postoperative nausea and vomiting 01/02/2016    Patient Active Problem List   Diagnosis Date Noted  . Bowel perforation ([place]) 08/18/2017  . Intractable right heel pain 10/07/2016  . Plantar fasciitis of right foot 10/07/2016  . Postoperative nausea and vomiting 01/02/2016  . Hyponatremia 01/01/2016  . Acute hypokalemia  01/01/2016  . Endometrial cancer ([place]) 12/30/2015  . Familial hypercholesteremia 07/28/2015  . Aortic valve stenosis 08/02/2014  . S/P CABG x 3 01/13/2013  . Palpitations 01/13/2013  . Medication intolerance 01/13/2013  . Chronic pain 01/13/2013  . TIA (transient ischemic attack) 01/13/2013  . Carotid stenosis 01/13/2013  . Hypothyroid 10/05/2010  . Essential hypertension 10/05/2010  . Gastroesophagitis 10/05/2010  . Atrophic gastritis 10/05/2010    Past Surgical History:  Procedure Laterality Date  . ABDOMINAL HYSTERECTOMY    . Carotid Doppler  02/2012   40-59% right int carotid artery stenosis; 60-79% L int carotid stenosis; L carotid bruit  . CORONARY ARTERY BYPASS GRAFT  03/12/2004   LIMA to LAD, SVG to circumflex, SVG to PDA  . IR GENERIC HISTORICAL  04/29/2016   IR RADIOLOGIST EVAL & MGMT 04/29/2016 Sandi Mariscal, MD [facility] INTERV RAD  . IR GENERIC HISTORICAL  05/12/2016   IR RADIOLOGIST EVAL & MGMT 05/12/2016 Sandi Mariscal, MD [facility] INTERV RAD  . ROBOTIC ASSISTED TOTAL HYSTERECTOMY WITH BILATERAL SALPINGO OOPHERECTOMY Bilateral 12/30/2015   Procedure: XI ROBOTIC ASSISTED TOTAL HYSTERECTOMY WITH BILATERAL SALPINGO OOPHORECTOMY WITH SENTAL LYMPH NODE BIOPSY;  Surgeon: Nancy Marus, MD;  Location: [facility];  Service: Gynecology;  Laterality: Bilateral;  . TONSILLECTOMY    . TRANSTHORACIC ECHOCARDIOGRAM  04/2007   EF>55%; mild MR; mild-mod TR; mild pulm HTN; mild calcification of aortiv valve leaflets with mild valvular aortic stenosis  . TUBAL LIGATION      OB History  [facility]      Home Medications    Prior to Admission medications   Medication Sig Start Date End Date Taking? Authorizing Provider  acetaminophen (TYLENOL) 500 MG tablet Take 500 mg by mouth every 6 (six) hours as needed for mild pain, moderate pain, fever or headache.   Yes [provider]  chlorthalidone (HYGROTON) 25 MG tablet take 1 tablet by mouth once daily 06/07/17  Yes Hilty, Nadean Corwin, MD    levothyroxine (SYNTHROID, LEVOTHROID) 50 MCG tablet Take 50 mcg by mouth daily.    Yes [provider]  polyethylene glycol (MIRALAX / GLYCOLAX) packet Take 17 g by mouth daily as needed for mild constipation.   Yes [provider]  traMADol (ULTRAM) 50 MG tablet Take 50 mg by mouth every 12 (twelve) hours as needed for moderate pain.   Yes [provider]  irbesartan (AVAPRO) 300 MG tablet take 1 tablet by mouth once daily Patient not taking: Reported on 08/18/2017 12/16/16   Pixie Casino, MD    Family History Family History  Problem Relation Age of Onset  . Hypertension Mother   . Heart disease Mother        Died in her 70s  . Stroke Father   . Kidney disease Brother   . Heart disease Brother        also HTN, hyperlipidemia  . Heart attack Brother   . Stroke Sister        x2  . Hypertension Sister     Social History Social History   Tobacco Use  . Smoking status: Never Smoker  . Smokeless tobacco: Never Used  Substance Use Topics  . Alcohol use: No  . Drug use: No     Allergies   Statins and Ciprofloxacin   Review of Systems Review of Systems  Constitutional: Positive for fatigue.  Cardiovascular: Positive for palpitations.  Gastrointestinal: Positive for abdominal pain, constipation, nausea and vomiting.  Neurological: Positive for weakness.  All other systems reviewed and are negative.    Physical Exam Updated Vital Signs BP 105/90   Pulse 91   Temp (!) 97.5 F (36.4 C) (Oral)   Resp (!) 21   SpO2 96%   Physical Exam  Constitutional: She is oriented to person, place, and time. She appears well-developed and well-nourished. She appears distressed.  HENT:  Head: Normocephalic and atraumatic.  Eyes: Conjunctivae are normal.  Neck: Neck supple.  Cardiovascular: Normal heart sounds. An irregularly irregular rhythm present. Tachycardia present. Exam reveals no friction rub.  No murmur heard. Pulmonary/Chest: Effort normal  and breath sounds normal. No tachypnea. No respiratory distress. She has no wheezes. She has no rales.  Abdominal: Soft. Normal appearance. She exhibits distension. There is generalized tenderness. There is guarding. There is no rigidity and no rebound.  Musculoskeletal: She exhibits no edema.  Neurological: She is alert and oriented to person, place, and time. She exhibits normal muscle tone.  Skin: Skin is warm. Capillary refill takes less than 2 seconds.  Psychiatric: She has a normal mood and affect.  Nursing note and vitals reviewed.    ED Treatments / Results  Labs (all labs ordered are listed, but only abnormal results are displayed) Labs Reviewed  COMPREHENSIVE METABOLIC PANEL - Abnormal; Notable for the following components:      Result Value   Sodium 123 (*)    Potassium 2.1 (*)    Chloride 80 (*)    Glucose, Bld 123 (*)    ALT 12 (*)  All other components within normal limits  CBC - Abnormal; Notable for the following components:   Hemoglobin 10.7 (*)    HCT 32.4 (*)    Platelets 515 (*)    All other components within normal limits  URINALYSIS, ROUTINE W REFLEX MICROSCOPIC - Abnormal; Notable for the following components:   Ketones, ur 20 (*)    All other components within normal limits  LIPASE, BLOOD  MAGNESIUM  BASIC METABOLIC PANEL  COMPREHENSIVE METABOLIC PANEL  I-STAT CG4 LACTIC ACID, ED  I-STAT CG4 LACTIC ACID, ED  I-STAT TROPONIN, ED    EKG  EKG Interpretation  Date/Time:  Thursday August 18 2017 14:49:35 EDT Ventricular Rate:  105 PR Interval:    QRS Duration: 105 QT Interval:  367 QTC Calculation: 485 R Axis:   63 Text Interpretation:  Atrial fibrillation Probable LVH with secondary repol abnrm Inferoposterior infarct, recent Abnormal T, probable ischemia, anterior leads Since last EKG, atrial fibrillation has replaced sinus rhythm There are new ST depressions in anterior leads Confirmed by Duffy Bruce [phone]) on 08/18/2017 3:32:17 PM         Radiology Ct Angio Abd/pel W And/or Wo Contrast  Result Date: 08/18/2017 CLINICAL DATA:  Vomiting.  Abdominal pain. EXAM: CTA ABDOMEN AND PELVIS wITHOUT AND WITH CONTRAST TECHNIQUE: Multidetector CT imaging of the abdomen and pelvis was performed using the standard protocol during bolus administration of intravenous contrast. Multiplanar reconstructed images and MIPs were obtained and reviewed to evaluate the vascular anatomy. CONTRAST:  148m ISOVUE-370 IOPAMIDOL (ISOVUE-370) INJECTION 76% COMPARISON:  04/15/2016 FINDINGS: VASCULAR Aorta: There are atherosclerotic calcifications and irregular plaque in the abdominal aorta without aneurysmal dilatation or significant luminal narrowing. There is a penetrating ulcer in the right posterolateral aorta at the level of the renal arteries. Celiac: Patent. SMA: Patent. Renals: Single right and 2 left renal arteries are patent. IMA: Patent. Inflow: Diffuse atherosclerotic calcifications in the bilateral common iliac arteries without significant narrowing. Internal and external iliac arteries are patent with scattered atherosclerotic changes. Proximal Outflow: Grossly patent. Review of the MIP images confirms the above findings. NON-VASCULAR Lower chest: Dependent atelectasis. Hepatobiliary: Diffuse hepatic steatosis. Gallbladder is distended. A few small layering gallstones are noted Pancreas: Unremarkable Spleen: Unremarkable Adrenals/Urinary Tract: Moderate left hydronephrosis. Left hydroureter. Simple cyst in the right kidney. Adrenal glands are within normal limits. The left ureter is dilated to the left hemipelvis. It becomes narrowed in the inflammatory pelvic mass. This is described below. Bladder is within normal limits. Stomach/Bowel: Large hiatal hernia is unchanged. Stable prominent gastric folds within the hiatal hernia. The colon is diffusely distended. Distal small bowel is distended. A transition point between dilated large bowel and decompressed  large bowel occurs in the sigmoid colon. There is a heterogeneous ill-defined mass involving the sigmoid colon and left side of the pelvis which includes the colon wall, adjacent fat, and both fluid and gas elements. Findings are suspicious for a focal perforation with abscess formation. Underlying malignancy is a strong consideration given the appearance and colon obstruction. The mass also involves the left ureter causing an element of left ureteral dilatation worrisome for an element of left ureteral obstruction. The gas and fluid collection is very small measuring 1.8 x 1.2 cm on image 109 of series 4. Overall mass size including the involved colon is 5.7 x 5.3 cm. Normal appendix. Lymphatic: Several left iliac and left lower quadrant mesenteric lymph nodes are identified. The largest on image 92 measures 0.9 cm in short axis diameter. Reproductive: Post hysterectomy.  The right ovary is not identified. A mass is present in the left adnexa as described above. Other: Noncontributory. Musculoskeletal: No vertebral compression. IMPRESSION: VASCULAR Atherosclerotic changes without obstruction. NON-VASCULAR There is a mass in the left hemipelvis involving the sigmoid colon and left pelvic soft tissues resulting in colon obstruction and left ureteral obstruction. The mass does contain a focal gas and fluid collection consistent with perforation and abscess formation. It is small measuring up to 1.8 cm. Cholelithiasis. Electronically Signed   By: Marybelle Killings M.D.   On: 08/18/2017 15:12    Procedures .Critical Care Performed by: Duffy Bruce, MD Authorized by: Duffy Bruce, MD   Critical care provider statement:    Critical care time (minutes):  80   Critical care time was exclusive of:  Separately billable procedures and treating other patients and teaching time   Critical care was necessary to treat or prevent imminent or life-threatening deterioration of the following conditions:  Cardiac failure,  circulatory failure, sepsis and metabolic crisis   Critical care was time spent personally by me on the following activities:  Development of treatment plan with patient or surrogate, discussions with consultants, evaluation of patient's response to treatment, examination of patient, obtaining history from patient or surrogate, ordering and performing treatments and interventions, ordering and review of laboratory studies, ordering and review of radiographic studies, pulse oximetry, re-evaluation of patient's condition and review of old charts   I assumed direction of critical care for this patient from another provider in my specialty: no     (including critical care time)  Medications Ordered in ED Medications  potassium chloride SA (K-DUR,KLOR-CON) CR tablet 40 mEq (0 mEq Oral Hold 08/18/17 1419)  potassium chloride 10 mEq in 100 mL IVPB (10 mEq Intravenous New Bag/Given 08/18/17 1630)  diclofenac sodium (VOLTAREN) 1 % transdermal gel 2 g (has no administration in time range)  sodium chloride 0.9 % injection (has no administration in time range)  iopamidol (ISOVUE-370) 76 % injection (has no administration in time range)  magnesium sulfate IVPB 2 g 50 mL (2 g Intravenous New Bag/Given 08/18/17 1702)  potassium chloride 10 mEq in 100 mL IVPB (has no administration in time range)  0.9 %  sodium chloride infusion (has no administration in time range)  piperacillin-tazobactam (ZOSYN) IVPB 3.375 g (has no administration in time range)  levothyroxine (SYNTHROID, LEVOTHROID) tablet 50 mcg (has no administration in time range)  morphine 2 MG/ML injection 2 mg (has no administration in time range)  morphine 4 MG/ML injection 4 mg (has no administration in time range)  ondansetron (ZOFRAN-ODT) disintegrating tablet 4 mg (4 mg Oral Given 08/18/17 1137)  iopamidol (ISOVUE-370) 76 % injection 100 mL (100 mLs Intravenous Contrast Given 08/18/17 1429)  sodium chloride 0.9 % bolus 1,000 mL (0 mLs Intravenous  Stopped 08/18/17 1632)  piperacillin-tazobactam (ZOSYN) IVPB 3.375 g (0 g Intravenous Stopped 08/18/17 1707)  fentaNYL (SUBLIMAZE) injection 25 mcg (25 mcg Intravenous Given 08/18/17 1628)     Initial Impression / Assessment and Plan / ED Course  I have reviewed the triage vital signs and the nursing notes.  Pertinent labs & imaging results that were available during my care of the patient were reviewed by me and considered in my medical decision making (see chart for details).     77 yo F here with n/v, abdominal pain, and distension.  History and exam is consistent with hemoglobin of 7 likely bowel obstruction.  Also so will check labs, imaging, and reassess.  Lab work  reviewed.  White count normal.  Hemoglobin normal.  Platelets elevated, likely reactive due to inflammatory process.  CMP with market hyponatremia, hypokalemia, likely secondary to her vomiting and volume depletion.  She is been given fluids and will be given IV potassium and empiric magnesium.  Urinalysis with ketonuria consistent with dehydration.  Lipase normal.  Lactic acid normal which is reassuring.  Of note, while getting CT contrast, patient complained of palpitations and was noted to be in A. fib RVR.  She has new significant ST depressions in the anterior precordial leads.  Discussed with Dr. Stanford Breed of cardiology.  She has no chest pain at this time, and I suspect this is demand ischemia secondary to A. fib RVR in the setting of metabolic derangements.  Given that she has no chest pain, will plan to treat medically, hold on catheterization at this time, and continue medical management.  Posterior EKG ordered.  No chest pain at this time to suggest ongoing ischemia.  CT scan obtained and is concerning for colonic mass.  This is obstructing the left ureter as well as causing bowel obstruction.  I consulted surgery who will see the patient.  Also discussed with Dr. Alyson Ingles of urology, who will see the patient.  Discussed with  intensivist on-call, Dr. Halford Chessman, regarding her multiple metabolic derangements, ST changes and new onset A. fib, and he recommends hospitalist admission with consultation with him as needed.  Discussed with hospitalist who will admit.  [id number] Score for Atrial Fibrillation    Patient Score  Age <65 = 0 65-74 = 1 > 75 = 2 2  Sex Female = 0 Female = 1 1  CHF History No = 0  Yes = 1 1  HTN History No = 0  Yes = 1 1  Stroke/TIA/TE History No = 0  Yes = 1 0  Vascular Disease History No = 0  Yes = 1 1  Diabetes History No = 0  Yes = 1 0  Total:  6   9.7 % stroke rate/year from a score of 6   Final Clinical Impressions(s) / ED Diagnoses   Final diagnoses:  Bowel perforation ([place])  Colonic mass  Partial intestinal obstruction, unspecified cause ([place])  New onset atrial fibrillation ([place])  ST segment depression  Hypokalemia    ED Discharge Orders    [facility]       Duffy Bruce, MD 08/18/17 1711

## 2017-08-18 NOTE — ED Notes (Signed)
Pt converted to NSR.  Hochrein MD Cards at bedside.  EKG obtained per his request

## 2017-08-18 NOTE — ED Notes (Signed)
[facility] is sending over for abdominal pain, N/V

## 2017-08-18 NOTE — ED Notes (Signed)
CArdizem placed on hold.  Pt's HR is back down to the 70s.

## 2017-08-18 NOTE — ED Notes (Addendum)
Attempted to placed NG tube as ordered x 2 without success.  Pt became tachycardic at 172bpm.  Lacinda Axon EDP went in the room to assess pt.  Pt is A&O x 4.  She denies any cp, SOB or dizziness.

## 2017-08-18 NOTE — Consult Note (Signed)
Cardiology Consultation:   Patient ID: DEMYAH SMYRE; [id number]; 1941/05/08   Admit date: 08/18/2017 Date of Consult: 08/18/2017  Primary Care Provider: [name], MD Primary Cardiologist: Pixie Casino, MD  Primary Electrophysiologist:  None   Patient Profile:   Shannon Scott is a 77 y.o. female with a hx of CAD who is being seen today for the evaluation of atrial fib at the request of Dr Ellender Hose.  History of Present Illness:   Shannon Scott has a history of CABG in 2005.  Her last stress test was in 2013 and was negative for ischemia.  Echo in 2016 demonstrated an EF of 55 - 60%.  She had mild AS She has dyslipidemia that has been difficult to manage secondary to multiple medications intolerances.    She presented to the ED with left sided abdominal pain with nausea and vomiting.    CT of the abdomen was ordered and she was found to have diffuse distension of her sigmoid colon.  There is a mass involving the sigmoid colon that was thought to be associated with a possible perforation with abscess.   On returning from CT she felt well and was noted to have tachycardia with atrial fib.  We were called urgently to evaluate.  EKG demonstrated diffuse T wave inversion and QT prolongation.   She did have mild ST depression on a previous EKG much less pronounced that this.    She reported that at that time she felt her heart racing and she did have some chest pressure.  She converted on her own to NSR and is quite comfortable.  Follow up EKG demonstrated NSR and resolution of the T wave changes.   However, the pain in her abdomen is so severe that she cannot really recall that pain now.  She says that she had been doing OK at home until the last 24 hours or so when she developed the abdominal pain.  She can carry groceries and has not had recent chest pain, neck or arm pain.  She has not have palpitations, presyncope or syncope.  She denies any acute SOB, PND or orthopnea.     Past  Medical History:  Diagnosis Date  . Arthritis   . Asthma    allergy induced  . Bilateral carotid artery disease (HCC)    L carotid bruit  . CAD (coronary artery disease)   . Cancer ([place])   . Dyslipidemia    intolerant to statins, welchol, niacin, zetia  . History of blood transfusion   . History of nuclear stress test 04/24/2012   lexiscan; normal study  . Hypertension   . Hypothyroidism   . Postoperative nausea and vomiting 01/02/2016    Past Surgical History:  Procedure Laterality Date  . ABDOMINAL HYSTERECTOMY    . Carotid Doppler  02/2012   40-59% right int carotid artery stenosis; 60-79% L int carotid stenosis; L carotid bruit  . CORONARY ARTERY BYPASS GRAFT  03/12/2004   LIMA to LAD, SVG to circumflex, SVG to PDA  . IR GENERIC HISTORICAL  04/29/2016   IR RADIOLOGIST EVAL & MGMT 04/29/2016 Sandi Mariscal, MD [facility] INTERV RAD  . IR GENERIC HISTORICAL  05/12/2016   IR RADIOLOGIST EVAL & MGMT 05/12/2016 Sandi Mariscal, MD [facility] INTERV RAD  . ROBOTIC ASSISTED TOTAL HYSTERECTOMY WITH BILATERAL SALPINGO OOPHERECTOMY Bilateral 12/30/2015   Procedure: XI ROBOTIC ASSISTED TOTAL HYSTERECTOMY WITH BILATERAL SALPINGO OOPHORECTOMY WITH SENTAL LYMPH NODE BIOPSY;  Surgeon: Nancy Marus, MD;  Location: [facility];  Service: Gynecology;  Laterality: Bilateral;  . TONSILLECTOMY    . TRANSTHORACIC ECHOCARDIOGRAM  04/2007   EF>55%; mild MR; mild-mod TR; mild pulm HTN; mild calcification of aortiv valve leaflets with mild valvular aortic stenosis  . TUBAL LIGATION       Prior to Admission medications   Medication Sig Start Date End Date Taking? Authorizing Provider  acetaminophen (TYLENOL) 500 MG tablet Take 500 mg by mouth every 6 (six) hours as needed for mild pain, moderate pain, fever or headache.   Yes [provider]  chlorthalidone (HYGROTON) 25 MG tablet take 1 tablet by mouth once daily 06/07/17  Yes Hilty, Nadean Corwin, MD  levothyroxine (SYNTHROID, LEVOTHROID) 50 MCG tablet Take 50 mcg by  mouth daily.    Yes [provider]  polyethylene glycol (MIRALAX / GLYCOLAX) packet Take 17 g by mouth daily as needed for mild constipation.   Yes [provider]  traMADol (ULTRAM) 50 MG tablet Take 50 mg by mouth every 12 (twelve) hours as needed for moderate pain.   Yes [provider]  irbesartan (AVAPRO) 300 MG tablet take 1 tablet by mouth once daily Patient not taking: Reported on 08/18/2017 12/16/16   Pixie Casino, MD     Inpatient Medications: Scheduled Meds: . diclofenac sodium  2 g Topical QID  . iopamidol      .  morphine injection  2 mg Intravenous Once  . potassium chloride  40 mEq Oral Once  . sodium chloride       Continuous Infusions: . sodium chloride    . magnesium sulfate 1 - 4 g bolus IVPB 2 g (08/18/17 1702)  . piperacillin-tazobactam 3.375 g (08/18/17 1636)  . potassium chloride      Allergies:    Allergies  Allergen Reactions  . Statins Other (See Comments)    Myalgias and memory problems  . Ciprofloxacin Itching    Splotchy redness with itching during IV infusion localized to arm.    Social History:   Social History   Socioeconomic History  . Marital status: Widowed    Spouse name: Not on file  . Number of children: 2  . Years of education: Not on file  . Highest education level: Not on file  Occupational History    Employer: RETIRED  Social Needs  . Financial resource strain: Not on file  . Food insecurity:    Worry: Not on file    Inability: Not on file  . Transportation needs:    Medical: Not on file    Non-medical: Not on file  Tobacco Use  . Smoking status: Never Smoker  . Smokeless tobacco: Never Used  Substance and Sexual Activity  . Alcohol use: No  . Drug use: No  . Sexual activity: Not on file  Lifestyle  . Physical activity:    Days per week: Not on file    Minutes per session: Not on file  . Stress: Not on file  Relationships  . Social connections:    Talks on phone: Not on file     Gets together: Not on file    Attends religious service: Not on file    Active member of club or organization: Not on file    Attends meetings of clubs or organizations: Not on file    Relationship status: Not on file  . Intimate partner violence:    Fear of current or ex partner: Not on file    Emotionally abused: Not on file  Physically abused: Not on file    Forced sexual activity: Not on file  Other Topics Concern  . Not on file  Social History Narrative  . Not on file    Family History:    Family History  Problem Relation Age of Onset  . Hypertension Mother   . Heart disease Mother        Died in her 56s  . Stroke Father   . Kidney disease Brother   . Heart disease Brother        also HTN, hyperlipidemia  . Heart attack Brother   . Stroke Sister        x2  . Hypertension Sister      ROS:  Please see the history of present illness.  ROS  All other ROS reviewed and negative.     Physical Exam/Data:   Vitals:   08/18/17 1134 08/18/17 1414 08/18/17 1533 08/18/17 1635  BP: (!) 202/90 (!) 190/73 138/72 (!) 164/56  Pulse: 81 88 (!) 127 98  Resp: 18 20 20 14   Temp: (!) 97.5 F (36.4 C)     TempSrc: Oral     SpO2: 95% 96% 97% 95%    Intake/Output Summary (Last 24 hours) at 08/18/2017 1702 Last data filed at 08/18/2017 1632 Gross per 24 hour  Intake 1100 ml  Output -  Net 1100 ml   There were no vitals filed for this visit. There is no height or weight on file to calculate BMI.  GENERAL:  Uncomfortable appearing HEENT:   Pupils equal round and reactive, fundi not visualized, oral mucosa unremarkable NECK:  No  jugular venous distention, waveform within normal limits, carotid upstroke brisk and symmetric, no bruits, no thyromegaly LYMPHATICS:  No cervical, inguinal adenopathy LUNGS:   Clear to auscultation bilaterally BACK:  No CVA tenderness CHEST:  Well healed surgical scar.  HEART:  PMI not displaced or sustained,S1 and S2 within normal limits, no S3, no  clicks, no rubs, 3/6 apical systolic murmur early peaking and radiating out the outflow tract, no diastolic murmurs, irregular ABD:  Flat, positive bowel sounds decreased in frequency , no bruits, tender to mild palpation EXT:  2 plus pulses throughout, no  edema, no cyanosis no clubbing SKIN:  No rashes no nodules NEURO:   Cranial nerves II through XII grossly intact, motor grossly intact throughout PSYCH:    Cognitively intact, oriented to person place and time   EKG:  The EKG was personally reviewed and demonstrates:  Atrial fib, rate 108, axis WNL QT prolonged, diffuse anterior lateral T wave inversion consistent with ischemia.    Telemetry:  Telemetry was personally reviewed and demonstrates:  Atrial fib.   Relevant CV Studies: NA  Laboratory Data:  Chemistry Recent Labs  Lab 08/18/17 1142  NA 123*  K 2.1*  CL 80*  CO2 29  GLUCOSE 123*  BUN 6  CREATININE 0.58  CALCIUM 9.1  GFRNONAA >60  GFRAA >60  ANIONGAP 14    Recent Labs  Lab 08/18/17 1142  PROT 7.9  ALBUMIN 3.8  AST 28  ALT 12*  ALKPHOS 100  BILITOT 0.4   Hematology Recent Labs  Lab 08/18/17 1142  WBC 9.0  RBC 4.00  HGB 10.7*  HCT 32.4*  MCV 81.0  MCH 26.8  MCHC 33.0  RDW 13.8  PLT 515*   Cardiac EnzymesNo results for input(s): TROPONINI in the last 168 hours. No results for input(s): TROPIPOC in the last 168 hours.  BNPNo results for input(s):  BNP, PROBNP in the last 168 hours.  DDimer No results for input(s): DDIMER in the last 168 hours.  Radiology/Studies:  Ct Angio Abd/pel W And/or Wo Contrast  Result Date: 08/18/2017 CLINICAL DATA:  Vomiting.  Abdominal pain. EXAM: CTA ABDOMEN AND PELVIS wITHOUT AND WITH CONTRAST TECHNIQUE: Multidetector CT imaging of the abdomen and pelvis was performed using the standard protocol during bolus administration of intravenous contrast. Multiplanar reconstructed images and MIPs were obtained and reviewed to evaluate the vascular anatomy. CONTRAST:  135m  ISOVUE-370 IOPAMIDOL (ISOVUE-370) INJECTION 76% COMPARISON:  04/15/2016 FINDINGS: VASCULAR Aorta: There are atherosclerotic calcifications and irregular plaque in the abdominal aorta without aneurysmal dilatation or significant luminal narrowing. There is a penetrating ulcer in the right posterolateral aorta at the level of the renal arteries. Celiac: Patent. SMA: Patent. Renals: Single right and 2 left renal arteries are patent. IMA: Patent. Inflow: Diffuse atherosclerotic calcifications in the bilateral common iliac arteries without significant narrowing. Internal and external iliac arteries are patent with scattered atherosclerotic changes. Proximal Outflow: Grossly patent. Review of the MIP images confirms the above findings. NON-VASCULAR Lower chest: Dependent atelectasis. Hepatobiliary: Diffuse hepatic steatosis. Gallbladder is distended. A few small layering gallstones are noted Pancreas: Unremarkable Spleen: Unremarkable Adrenals/Urinary Tract: Moderate left hydronephrosis. Left hydroureter. Simple cyst in the right kidney. Adrenal glands are within normal limits. The left ureter is dilated to the left hemipelvis. It becomes narrowed in the inflammatory pelvic mass. This is described below. Bladder is within normal limits. Stomach/Bowel: Large hiatal hernia is unchanged. Stable prominent gastric folds within the hiatal hernia. The colon is diffusely distended. Distal small bowel is distended. A transition point between dilated large bowel and decompressed large bowel occurs in the sigmoid colon. There is a heterogeneous ill-defined mass involving the sigmoid colon and left side of the pelvis which includes the colon wall, adjacent fat, and both fluid and gas elements. Findings are suspicious for a focal perforation with abscess formation. Underlying malignancy is a strong consideration given the appearance and colon obstruction. The mass also involves the left ureter causing an element of left ureteral  dilatation worrisome for an element of left ureteral obstruction. The gas and fluid collection is very small measuring 1.8 x 1.2 cm on image 109 of series 4. Overall mass size including the involved colon is 5.7 x 5.3 cm. Normal appendix. Lymphatic: Several left iliac and left lower quadrant mesenteric lymph nodes are identified. The largest on image 92 measures 0.9 cm in short axis diameter. Reproductive: Post hysterectomy. The right ovary is not identified. A mass is present in the left adnexa as described above. Other: Noncontributory. Musculoskeletal: No vertebral compression. IMPRESSION: VASCULAR Atherosclerotic changes without obstruction. NON-VASCULAR There is a mass in the left hemipelvis involving the sigmoid colon and left pelvic soft tissues resulting in colon obstruction and left ureteral obstruction. The mass does contain a focal gas and fluid collection consistent with perforation and abscess formation. It is small measuring up to 1.8 cm. Cholelithiasis. Electronically Signed   By: AMarybelle KillingsM.D.   On: 08/18/2017 15:12    Assessment and Plan:   CHEST PAIN:  She had transient chest pain.  She will be NPO and now that her atrial fib has resolved I do not feel strongly, given the acute bowel process, that we need to use heparin or ASA PR.  Cycle enzymes and check an echocardiogram.  Start IV Dilt if she has recurrent atrial fib.   HTN:  BP elevated during her acute pain.  Resolved.  Follow.   ATRIAL FIB:     This is new onset.   Resolved spontaneously.  Manage as above.    DYSLIPIDEMIA:   She has been intolerant of all medications.   She has not wanted PCSK9.   No change in therapy.  COLON MASS:    Discussed with surgery.  They are planning conservative therapy for now with antibiotics and bowel rest.  We will need to follow and consider further ischemia evaluation if surgery is planned in the days to come.  I will await the results of the echo and enzymes.  HYPOKALEMIA:  She will need  multiple runs of IV potassium and follow up labs per the primary team.    AS:  This has been mild and she will have this assessed at echo.     For questions or updates, please contact [facility] Please consult [email or web address] for contact info under Cardiology/STEMI.   Signed, Minus Breeding, MD  08/18/2017 5:02 PM

## 2017-08-18 NOTE — Progress Notes (Signed)
CRITICAL VALUE ALERT  Critical Value: K+ 2.7  Date & Time Notied: 08/18/17 2300  Provider Notified: [facility] paged   Orders Received/Actions taken: Waiting for orders will continue to monitor

## 2017-08-19 ENCOUNTER — Inpatient Hospital Stay (HOSPITAL_COMMUNITY): Payer: Medicare Other

## 2017-08-19 ENCOUNTER — Inpatient Hospital Stay: Payer: Self-pay

## 2017-08-19 DIAGNOSIS — K639 Disease of intestine, unspecified: Secondary | ICD-10-CM

## 2017-08-19 DIAGNOSIS — I361 Nonrheumatic tricuspid (valve) insufficiency: Secondary | ICD-10-CM

## 2017-08-19 LAB — COMPREHENSIVE METABOLIC PANEL
ALT: 11 U/L — ABNORMAL LOW (ref 14–54)
AST: 23 U/L (ref 15–41)
Albumin: 2.9 g/dL — ABNORMAL LOW (ref 3.5–5.0)
Alkaline Phosphatase: 79 U/L (ref 38–126)
Anion gap: 11 (ref 5–15)
BUN: 5 mg/dL — ABNORMAL LOW (ref 6–20)
CO2: 26 mmol/L (ref 22–32)
Calcium: 8.3 mg/dL — ABNORMAL LOW (ref 8.9–10.3)
Chloride: 93 mmol/L — ABNORMAL LOW (ref 101–111)
Creatinine, Ser: 0.68 mg/dL (ref 0.44–1.00)
GFR calc Af Amer: >60 mL/min (ref >60)
GFR calc non Af Amer: >60 mL/min (ref >60)
Glucose, Bld: 112 mg/dL — ABNORMAL HIGH (ref 65–99)
Potassium: 2.6 mmol/L — CL (ref 3.5–5.1)
Sodium: 130 mmol/L — ABNORMAL LOW (ref 135–145)
Total Bilirubin: 0.6 mg/dL (ref 0.3–1.2)
Total Protein: 6.1 g/dL — ABNORMAL LOW (ref 6.5–8.1)

## 2017-08-19 LAB — PREALBUMIN: Prealbumin: 9.3 mg/dL — ABNORMAL LOW (ref 18–38)

## 2017-08-19 LAB — BASIC METABOLIC PANEL
Anion gap: 9 (ref 5–15)
BUN: <5 mg/dL — ABNORMAL LOW (ref 6–20)
CO2: 23 mmol/L (ref 22–32)
Calcium: 7.4 mg/dL — ABNORMAL LOW (ref 8.9–10.3)
Chloride: 100 mmol/L — ABNORMAL LOW (ref 101–111)
Creatinine, Ser: 0.58 mg/dL (ref 0.44–1.00)
GFR calc Af Amer: >60 mL/min (ref >60)
GFR calc non Af Amer: >60 mL/min (ref >60)
Glucose, Bld: 91 mg/dL (ref 65–99)
Potassium: 3.2 mmol/L — ABNORMAL LOW (ref 3.5–5.1)
Sodium: 132 mmol/L — ABNORMAL LOW (ref 135–145)

## 2017-08-19 LAB — TROPONIN I
Troponin I: 0.77 ng/mL (ref <0.03)
Troponin I: 0.56 ng/mL (ref <0.03)

## 2017-08-19 LAB — ECHOCARDIOGRAM COMPLETE

## 2017-08-19 LAB — MAGNESIUM
Magnesium: 2.4 mg/dL (ref 1.7–2.4)
Magnesium: 1.8 mg/dL (ref 1.7–2.4)

## 2017-08-19 LAB — PHOSPHORUS: Phosphorus: 1.9 mg/dL — ABNORMAL LOW (ref 2.5–4.6)

## 2017-08-19 MED ORDER — LEVOTHYROXINE SODIUM 100 MCG IV SOLR
25.0000 ug | Freq: Every day | INTRAVENOUS | Status: DC
  Administered 2017-08-20 – 2017-08-25 (×5): 25 ug via INTRAVENOUS
  Filled 2017-08-19 (×5): qty 5

## 2017-08-19 MED ORDER — CHLORHEXIDINE GLUCONATE CLOTH 2 % EX PADS
6.0000 | MEDICATED_PAD | Freq: Every day | CUTANEOUS | Status: DC
  Administered 2017-08-19 – 2017-08-26 (×7): 6 via TOPICAL

## 2017-08-19 MED ORDER — LEVOTHYROXINE SODIUM 100 MCG IV SOLR: 50.0000 ug | Freq: Every day | INTRAVENOUS | Status: DC

## 2017-08-19 MED ORDER — POTASSIUM CHLORIDE 10 MEQ/100ML IV SOLN
10.0000 meq | INTRAVENOUS | Status: AC
  Administered 2017-08-19 (×6): 10 meq via INTRAVENOUS
  Filled 2017-08-19 (×6): qty 100

## 2017-08-19 MED ORDER — SODIUM CHLORIDE 0.9% FLUSH
10.0000 mL | INTRAVENOUS | Status: DC | PRN
  Administered 2017-08-26: 20 mL
  Filled 2017-08-19: qty 40

## 2017-08-19 MED ORDER — SODIUM CHLORIDE 0.9% FLUSH
10.0000 mL | Freq: Two times a day (BID) | INTRAVENOUS | Status: DC
  Administered 2017-08-19 – 2017-08-20 (×4): 10 mL
  Administered 2017-08-21: 30 mL
  Administered 2017-08-22 – 2017-08-26 (×8): 10 mL

## 2017-08-19 NOTE — Progress Notes (Signed)
  Echocardiogram 2D Echocardiogram has been performed.  Randa Lynn Taneasha Fuqua 08/19/2017, 12:24 PM

## 2017-08-19 NOTE — Consult Note (Addendum)
[facility] Nurse ostomy consult note  [facility] Nurse requested for preoperative stoma site marking by [facility] PA Will Creig Hines.  Discussed surgical procedure and stoma creation with patient and family.  Explained role of the [facility] nurse team.  Provided the patient with educational booklet and provided samples of pouching options.  Answered patient and family questions.   Examined patient sitting and standing in order to place the marking in the patient's visual field, away from any creases or abdominal contour issues and within the rectus muscle.  Attempted to mark below the patient's belt line. There is a deep crease at the umbilicus.  If possible, the umbilical line should be avoided.  First choice:   Marked for colostomy in the LLQ 4 cm to the left of the umbilicus and  0.3TW below the umbilicus.  Second Choice: Marked for colostomy in the LUQ  4cm to the left of the umbilicus and  6.5KC above the umbilicus.  Should a right sided ostomy be required intraoperatively, corresponding locations on the Right upper and lower quadrants would work.  Patient's abdomen cleansed with CHG wipes at site markings, allowed to air dry prior to marking.Covered mark with thin film transparent dressing to preserve mark until date of surgery (08/20/17).   [facility] Nurse team will follow up with patient after surgery if an ostomy is created for continued ostomy care and teaching.   Thanks, Maudie Flakes, MSN, RN, [place], Arther Abbott  Pager# [phone]

## 2017-08-19 NOTE — Progress Notes (Signed)
Peripherally Inserted Central Catheter/Midline Placement  The IV Nurse has discussed with the patient and/or persons authorized to consent for the patient, the purpose of this procedure and the potential benefits and risks involved with this procedure.  The benefits include less needle sticks, lab draws from the catheter, and the patient may be discharged home with the catheter. Risks include, but not limited to, infection, bleeding, blood clot (thrombus formation), and puncture of an artery; nerve damage and irregular heartbeat and possibility to perform a PICC exchange if needed/ordered by physician.  Alternatives to this procedure were also discussed.  Bard Power PICC patient education guide, fact sheet on infection prevention and patient information card has been provided to patient /or left at bedside.    PICC/Midline Placement Documentation  PICC Double Lumen 51/02/58 PICC Right Basilic 36 cm 0 cm (Active)  Indication for Insertion or Continuance of Line Prolonged intravenous therapies 08/19/2017 12:00 PM  Exposed Catheter (cm) 0 cm 08/19/2017 12:00 PM  Site Assessment Clean;Dry;Intact 08/19/2017 12:00 PM  Lumen #1 Status Flushed;Blood return noted 08/19/2017 12:00 PM  Lumen #2 Status Flushed;Blood return noted 08/19/2017 12:00 PM  Dressing Type Transparent 08/19/2017 12:00 PM  Dressing Status Clean;Dry;Intact;Antimicrobial disc in place 08/19/2017 12:00 PM  Dressing Intervention New dressing 08/19/2017 12:00 PM  Dressing Change Due 08/26/17 08/19/2017 12:00 PM       Christella Noa Albarece 08/19/2017, 12:30 PM

## 2017-08-19 NOTE — Progress Notes (Signed)
CRITICAL VALUE ALERT  Critical Value:  Troponin 0.77  Date & Time Notied:  08/19/17 1455  Provider Notified: Attending  Orders Received/Actions taken:

## 2017-08-19 NOTE — Progress Notes (Signed)
CC: 2 days of nausea and vomiting with acute left-sided abdominal pain 08/18/17  Subjective: She feels much better this AM.  Pain is well controlled.  Nausea has resolved, they could not get an NG in last PM.  She had a swipe of stool, but not much.     Objective: Vital signs in last 24 hours: Temp:  [97.5 F (36.4 C)-98.6 F (37 C)] 98.6 F (37 C) (03/22 0329) Pulse Rate:  [69-139] 77 (03/22 0600) Resp:  [11-24] 20 (03/22 0600) BP: (105-202)/(34-100) 145/40 (03/22 0600) SpO2:  [93 %-100 %] 99 % (03/22 0600) Last BM Date: 08/18/17 2658 IV recorded 900 urine recorded Afebrile, VSS K+ 2.6, Na 130  Intake/Output from previous day: 03/21 0701 - 03/22 0700 In: 2658.3 [I.V.:1208.3; IV Piggyback:1450] Out: 900 [Urine:900] Intake/Output this shift: No intake/output data recorded.  General appearance: alert, cooperative, no distress and much more comfortable than last PM Resp: clear to auscultation bilaterally Cardio: in Sinus this AM, and rate controlled. GI: soft abdomen, minimal discomfort LLQ, few BS, no nausea or vomiting  Lab Results:  Recent Labs    08/18/17 1142  WBC 9.0  HGB 10.7*  HCT 32.4*  PLT 515*    BMET Recent Labs    08/18/17 2232 08/19/17 0337  NA 129* 130*  K 2.7* 2.6*  CL 91* 93*  CO2 27 26  GLUCOSE 126* 112*  BUN 5* 5*  CREATININE 0.65 0.68  CALCIUM 8.4* 8.3*   PT/INR No results for input(s): LABPROT, INR in the last 72 hours.  Recent Labs  Lab 08/18/17 1142 08/19/17 0337  AST 28 23  ALT 12* 11*  ALKPHOS 100 79  BILITOT 0.4 0.6  PROT 7.9 6.1*  ALBUMIN 3.8 2.9*     Lipase     Component Value Date/Time   LIPASE 21 08/18/2017 1142     Medications: . diclofenac sodium  2 g Topical QID  . diltiazem  10 mg Intravenous Once  . levothyroxine  50 mcg Oral QAC breakfast  . pantoprazole (PROTONIX) IV  40 mg Intravenous Q24H   . sodium chloride 125 mL/hr at 08/18/17 1820  . diltiazem (CARDIZEM) infusion    .  piperacillin-tazobactam (ZOSYN)  IV Stopped (08/19/17 0308)  . potassium chloride     Anti-infectives (From admission, onward)   Start     Dose/Rate Route Frequency Ordered Stop   08/19/17 0000  piperacillin-tazobactam (ZOSYN) IVPB 3.375 g     3.375 g 12.5 mL/hr over 240 Minutes Intravenous Every 8 hours 08/18/17 1703     08/18/17 1545  piperacillin-tazobactam (ZOSYN) IVPB 3.375 g     3.375 g 100 mL/hr over 30 Minutes Intravenous  Once 08/18/17 1540 08/18/17 1707      Assessment/Plan History of endometrial cancer History of left carotid disease Coronary artery disease Hypertension Hypothyroid Hyperlipidemia  Obstructing left colon mass with probable perforation and abscess Chronic constipation Left ureter obstruction secondary to the mass.  Atrial fibrillation with ST depression Hyponatremia - 130 this AM Hypokalemia - 2.6  Mag and phos ordered for 1500, I have ordered one for now  FEN:  NPO/IV fluids ID:  Zosyn 3/21 =>> day 2 DVT: SCD's only Foley:  Wick placed last PM Follow up:  TBD   Plan:  PICC line, continue to replace K+, she will get marked this AM.  Keep her NPO except ice chips for comfort, complete resuscitation.  She will need colon resection when stable. Teach her to use IS.  LOS: 1 day    Cordella Nyquist 08/19/2017 [phone]

## 2017-08-19 NOTE — Progress Notes (Signed)
Progress Note  Patient Name: Shannon Scott Date of Encounter: 08/19/2017  Primary Cardiologist:   Pixie Casino, MD   Subjective   She had another episode of tachycardia last night when the NG was being placed.  Low O2 sats after morphine. They were unable to place the NG.  She has no chest pain and no SOB.  No palpitations.   Inpatient Medications    Scheduled Meds: . diclofenac sodium  2 g Topical QID  . diltiazem  10 mg Intravenous Once  . [START ON 08/20/2017] levothyroxine  25 mcg Intravenous Daily  . pantoprazole (PROTONIX) IV  40 mg Intravenous Q24H   Continuous Infusions: . sodium chloride 125 mL/hr at 08/19/17 0759  . diltiazem (CARDIZEM) infusion    . piperacillin-tazobactam (ZOSYN)  IV 3.375 g (08/19/17 0809)  . potassium chloride 10 mEq (08/19/17 1040)   PRN Meds: hydrALAZINE, morphine injection, ondansetron (ZOFRAN) IV   Vital Signs    Vitals:   08/19/17 0600 08/19/17 0750 08/19/17 0800 08/19/17 0900  BP: (!) 145/40  (!) 126/32 (!) 150/59  Pulse: 77  72 68  Resp: 20  20 17   Temp:  98.5 F (36.9 C)    TempSrc:  Oral    SpO2: 99%  99% 98%    Intake/Output Summary (Last 24 hours) at 08/19/2017 1108 Last data filed at 08/19/2017 1000 Gross per 24 hour  Intake 3658.33 ml  Output 1300 ml  Net 2358.33 ml   There were no vitals filed for this visit.  Telemetry    NSR, brief run of SVT - Personally Reviewed  ECG     - Personally Reviewed  Physical Exam   GEN: No acute distress.   Neck: No  JVD Cardiac: RRR, 3/6 apical systolic murmurs, rubs, or gallops.  Respiratory: Clear  to auscultation bilaterally. GI: Soft, tender, non-distended  MS: No  edema; No deformity. Neuro:  Nonfocal  Psych: Normal affect   Labs    Chemistry Recent Labs  Lab 08/18/17 1142 08/18/17 2232 08/19/17 0337  NA 123* 129* 130*  K 2.1* 2.7* 2.6*  CL 80* 91* 93*  CO2 29 27 26   GLUCOSE 123* 126* 112*  BUN 6 5* 5*  CREATININE 0.58 0.65 0.68  CALCIUM 9.1  8.4* 8.3*  PROT 7.9  --  6.1*  ALBUMIN 3.8  --  2.9*  AST 28  --  23  ALT 12*  --  11*  ALKPHOS 100  --  79  BILITOT 0.4  --  0.6  GFRNONAA >60 >60 >60  GFRAA >60 >60 >60  ANIONGAP 14 11 11      Hematology Recent Labs  Lab 08/18/17 1142  WBC 9.0  RBC 4.00  HGB 10.7*  HCT 32.4*  MCV 81.0  MCH 26.8  MCHC 33.0  RDW 13.8  PLT 515*    Cardiac EnzymesNo results for input(s): TROPONINI in the last 168 hours.  Recent Labs  Lab 08/18/17 1650  TROPIPOC 0.08     BNPNo results for input(s): BNP, PROBNP in the last 168 hours.   DDimer No results for input(s): DDIMER in the last 168 hours.   Radiology    [place] Ekg Site Rite  Result Date: 08/19/2017 If [facility] image not attached, placement could not be confirmed due to current cardiac rhythm.  Ct Angio Abd/pel W And/or Wo Contrast  Result Date: 08/18/2017 CLINICAL DATA:  Vomiting.  Abdominal pain. EXAM: CTA ABDOMEN AND PELVIS wITHOUT AND WITH CONTRAST TECHNIQUE: Multidetector CT imaging  of the abdomen and pelvis was performed using the standard protocol during bolus administration of intravenous contrast. Multiplanar reconstructed images and MIPs were obtained and reviewed to evaluate the vascular anatomy. CONTRAST:  182m ISOVUE-370 IOPAMIDOL (ISOVUE-370) INJECTION 76% COMPARISON:  04/15/2016 FINDINGS: VASCULAR Aorta: There are atherosclerotic calcifications and irregular plaque in the abdominal aorta without aneurysmal dilatation or significant luminal narrowing. There is a penetrating ulcer in the right posterolateral aorta at the level of the renal arteries. Celiac: Patent. SMA: Patent. Renals: Single right and 2 left renal arteries are patent. IMA: Patent. Inflow: Diffuse atherosclerotic calcifications in the bilateral common iliac arteries without significant narrowing. Internal and external iliac arteries are patent with scattered atherosclerotic changes. Proximal Outflow: Grossly patent. Review of the MIP images confirms the  above findings. NON-VASCULAR Lower chest: Dependent atelectasis. Hepatobiliary: Diffuse hepatic steatosis. Gallbladder is distended. A few small layering gallstones are noted Pancreas: Unremarkable Spleen: Unremarkable Adrenals/Urinary Tract: Moderate left hydronephrosis. Left hydroureter. Simple cyst in the right kidney. Adrenal glands are within normal limits. The left ureter is dilated to the left hemipelvis. It becomes narrowed in the inflammatory pelvic mass. This is described below. Bladder is within normal limits. Stomach/Bowel: Large hiatal hernia is unchanged. Stable prominent gastric folds within the hiatal hernia. The colon is diffusely distended. Distal small bowel is distended. A transition point between dilated large bowel and decompressed large bowel occurs in the sigmoid colon. There is a heterogeneous ill-defined mass involving the sigmoid colon and left side of the pelvis which includes the colon wall, adjacent fat, and both fluid and gas elements. Findings are suspicious for a focal perforation with abscess formation. Underlying malignancy is a strong consideration given the appearance and colon obstruction. The mass also involves the left ureter causing an element of left ureteral dilatation worrisome for an element of left ureteral obstruction. The gas and fluid collection is very small measuring 1.8 x 1.2 cm on image 109 of series 4. Overall mass size including the involved colon is 5.7 x 5.3 cm. Normal appendix. Lymphatic: Several left iliac and left lower quadrant mesenteric lymph nodes are identified. The largest on image 92 measures 0.9 cm in short axis diameter. Reproductive: Post hysterectomy. The right ovary is not identified. A mass is present in the left adnexa as described above. Other: Noncontributory. Musculoskeletal: No vertebral compression. IMPRESSION: VASCULAR Atherosclerotic changes without obstruction. NON-VASCULAR There is a mass in the left hemipelvis involving the sigmoid  colon and left pelvic soft tissues resulting in colon obstruction and left ureteral obstruction. The mass does contain a focal gas and fluid collection consistent with perforation and abscess formation. It is small measuring up to 1.8 cm. Cholelithiasis. Electronically Signed   By: AMarybelle KillingsM.D.   On: 08/18/2017 15:12    Cardiac Studies   ECHO:  Pending  Patient Profile     77y.o. female  with a hx of CAD who is being seen for the evaluation of atrial fib at the request of Dr IEllender Hose   Assessment & Plan    ATRIAL FIB:  No atrial fib overnight.  Continue telemetry.    CAD:  Awaiting echo.  Troponin needs to be cycled.  I will order this.  No further angina.  PREOP:  She need continued replenishment of her potassium and optimization of her status prior to surgery.  I did have a long discussion with the family.  I think that we should consider this patient to be high risk for cardiovascular complications with abdominal surgery.  I think that risk is probably 15% based on risk calculators.  However, given the nature of the GI issue surgery would be clearly indicated and it would not be reasonable to suggest a long delay to allow for cardiac cath/stress testing and potential treatment should we find obstructive disease.  This would delay surgery for weeks or months.  Therefore, I would not suggest further cardiac testing.  We will follow up the echo and labs.    We will be available to follow the patient throughout her hospital stay.  They patient and her family understand and accept this.     For questions or updates, please contact [place] Please consult [email or web address] for contact info under Cardiology/STEMI.   Signed, Minus Breeding, MD  08/19/2017, 11:08 AM

## 2017-08-19 NOTE — Progress Notes (Signed)
PROGRESS NOTE    Shannon Scott  ERX:[id number] DOB: 1941-04-08 DOA: 08/18/2017 PCP: [name], MD   Brief Narrative:Shannon Scott is a 77 y.o. female with medical history significant of coronary artery disease status post CABG, endometrial cancer status post total hysterectomy, hypothyroidism who presented to the emergency department with complaints of generalized abdominal pain, nausea and vomiting that have worsened since last 2-3 days.CT scan was done in the emergency department which showed heterogenous ill-defined mass involving the sigmoid colon in the left pelvis.  There is also suspicion for perforation with abscess formation.  Patient also went into new onset A. fib with RVR on presentation. General surgery and cardiology following.  Urology consulted for left ureteral dilation and possible need of ureteral stents before the surgery.  Assessment & Plan:   Principal Problem:   Colonic mass Active Problems:   Hypothyroid   Essential hypertension   S/P CABG x 3   Hyponatremia   Acute hypokalemia   Bowel perforation (HCC)   Ureteral dilatation   Colonic mass: Surgery following.  Likely she needs colonic resection.  High likelihood of malignancy.   Patient has history of endometrial cancer and is status post total hysterectomy in September 2017.  Bowel perforation/abscess :CT scan also reported focal gas and fluid collection consistent with perforation and abscess formation.  Started on Zosyn.  Patient is afebrile.  We will follow-up cultures. Continue pain management.Pain better today.  Ureteral dilation: Imaging showed dilation of left ureter secondary to colonic mass.  Urology consulted by emergency department.  She might need ureteral stents before surgery.  Awaiting urology evaluation.  Hyponatremia: Improving.We will continue to monitor sodium level.  Stared on normal saline.  Hypokalemia: Continue aggressive repletion.  We will continue to monitor the  levels.Will also check mag and phosphorus  Coronary artery disease: Denies any chest pain at present.  Status post CABG.  A. fib with RVR: Most likely triggered by metabolic derangements, electrolyte imbalance.  Cardiology following.  Currently she is normal sinus rhythm.  Echocardiogram has been ordered.  Troponin will be cycled.She is not a candidate for any kind of anti-coagulation at present.  Hypothyroidism: Continue Synthyroid  HTN: Currently blood pressure is on the lower side.  Patient is on chlorthalidone and Irbesartan at home  at home,which are on hold.     DVT prophylaxis:SCD Code Status: Full Family Communication: None present at the bedside today Disposition Plan: Unknown at this point   Consultants: General surgery, cardiology, urology Procedures: None  Antimicrobials: Zosyn since 3/21  Subjective: Patient seen and examined at bedside this morning.  Abdomen pain is much better today.  Objective: Vitals:   08/19/17 0750 08/19/17 0800 08/19/17 0900 08/19/17 1200  BP:  (!) 126/32 (!) 150/59 (!) 153/71  Pulse:  72 68 84  Resp:  20 17 (!) 24  Temp: 98.5 F (36.9 C)     TempSrc: Oral     SpO2:  99% 98% 97%    Intake/Output Summary (Last 24 hours) at 08/19/2017 1218 Last data filed at 08/19/2017 1200 Gross per 24 hour  Intake 4108.33 ml  Output 1300 ml  Net 2808.33 ml   There were no vitals filed for this visit.  Examination:  General exam: Not in [place], elderly female HEENT:PERRL,Oral mucosa moist, Ear/Nose normal on gross exam Respiratory system: Bilateral equal air entry, normal vesicular breath sounds, no wheezes or crackles  Cardiovascular system: S1 & S2 heard, RRR. No JVD, murmurs, rubs, gallops or clicks. No  pedal edema. Gastrointestinal system: Abdomen is nondistended, soft , mild generalized tenderness.  No organomegaly or masses felt. Normal bowel sounds heard. Central nervous system: Alert and oriented. No focal  neurological deficits. Extremities: No edema, no clubbing ,no cyanosis, distal peripheral pulses palpable. Skin: No rashes, lesions or ulcers,no icterus ,no pallor MSK: Normal muscle bulk,tone ,power Psychiatry: Judgement and insight appear normal. Mood & affect appropriate.     Data Reviewed: I have personally reviewed following labs and imaging studies  CBC: Recent Labs  Lab 08/18/17 1142  WBC 9.0  HGB 10.7*  HCT 32.4*  MCV 81.0  PLT 833*   Basic Metabolic Panel: Recent Labs  Lab 08/18/17 1142 08/18/17 1255 08/18/17 2232 08/19/17 0337  NA 123*  --  129* 130*  K 2.1*  --  2.7* 2.6*  CL 80*  --  91* 93*  CO2 29  --  27 26  GLUCOSE 123*  --  126* 112*  BUN 6  --  5* 5*  CREATININE 0.58  --  0.65 0.68  CALCIUM 9.1  --  8.4* 8.3*  MG  --  1.8  --  2.4   GFR: CrCl cannot be calculated (Unknown ideal weight.). Liver Function Tests: Recent Labs  Lab 08/18/17 1142 08/19/17 0337  AST 28 23  ALT 12* 11*  ALKPHOS 100 79  BILITOT 0.4 0.6  PROT 7.9 6.1*  ALBUMIN 3.8 2.9*   Recent Labs  Lab 08/18/17 1142  LIPASE 21   No results for input(s): AMMONIA in the last 168 hours. Coagulation Profile: No results for input(s): INR, PROTIME in the last 168 hours. Cardiac Enzymes: No results for input(s): CKTOTAL, CKMB, CKMBINDEX, TROPONINI in the last 168 hours. BNP (last 3 results) No results for input(s): PROBNP in the last 8760 hours. HbA1C: No results for input(s): HGBA1C in the last 72 hours. CBG: No results for input(s): GLUCAP in the last 168 hours. Lipid Profile: No results for input(s): CHOL, HDL, LDLCALC, TRIG, CHOLHDL, LDLDIRECT in the last 72 hours. Thyroid Function Tests: No results for input(s): TSH, T4TOTAL, FREET4, T3FREE, THYROIDAB in the last 72 hours. Anemia Panel: No results for input(s): VITAMINB12, FOLATE, FERRITIN, TIBC, IRON, RETICCTPCT in the last 72 hours. Sepsis Labs: Recent Labs  Lab 08/18/17 1317 08/18/17 1652  LATICACIDVEN 0.97 0.97      Recent Results (from the past 240 hour(s))  MRSA PCR Screening     Status: None   Collection Time: 08/18/17  7:43 PM  Result Value Ref Range Status   MRSA by PCR NEGATIVE NEGATIVE Final    Comment:        The GeneXpert MRSA Assay (FDA approved for NASAL specimens only), is one component of a comprehensive MRSA colonization surveillance program. It is not intended to diagnose MRSA infection nor to guide or monitor treatment for MRSA infections. Performed at [facility], [place] [address] [place], [place] [address]          Radiology Studies: [place] Ekg Site Rite  Result Date: 08/19/2017 If [facility] image not attached, placement could not be confirmed due to current cardiac rhythm.  Ct Angio Abd/pel W And/or Wo Contrast  Result Date: 08/18/2017 CLINICAL DATA:  Vomiting.  Abdominal pain. EXAM: CTA ABDOMEN AND PELVIS wITHOUT AND WITH CONTRAST TECHNIQUE: Multidetector CT imaging of the abdomen and pelvis was performed using the standard protocol during bolus administration of intravenous contrast. Multiplanar reconstructed images and MIPs were obtained and reviewed to evaluate the vascular anatomy. CONTRAST:  153m  ISOVUE-370 IOPAMIDOL (ISOVUE-370) INJECTION 76% COMPARISON:  04/15/2016 FINDINGS: VASCULAR Aorta: There are atherosclerotic calcifications and irregular plaque in the abdominal aorta without aneurysmal dilatation or significant luminal narrowing. There is a penetrating ulcer in the right posterolateral aorta at the level of the renal arteries. Celiac: Patent. SMA: Patent. Renals: Single right and 2 left renal arteries are patent. IMA: Patent. Inflow: Diffuse atherosclerotic calcifications in the bilateral common iliac arteries without significant narrowing. Internal and external iliac arteries are patent with scattered atherosclerotic changes. Proximal Outflow: Grossly patent. Review of the MIP images confirms the above findings. NON-VASCULAR Lower chest:  Dependent atelectasis. Hepatobiliary: Diffuse hepatic steatosis. Gallbladder is distended. A few small layering gallstones are noted Pancreas: Unremarkable Spleen: Unremarkable Adrenals/Urinary Tract: Moderate left hydronephrosis. Left hydroureter. Simple cyst in the right kidney. Adrenal glands are within normal limits. The left ureter is dilated to the left hemipelvis. It becomes narrowed in the inflammatory pelvic mass. This is described below. Bladder is within normal limits. Stomach/Bowel: Large hiatal hernia is unchanged. Stable prominent gastric folds within the hiatal hernia. The colon is diffusely distended. Distal small bowel is distended. A transition point between dilated large bowel and decompressed large bowel occurs in the sigmoid colon. There is a heterogeneous ill-defined mass involving the sigmoid colon and left side of the pelvis which includes the colon wall, adjacent fat, and both fluid and gas elements. Findings are suspicious for a focal perforation with abscess formation. Underlying malignancy is a strong consideration given the appearance and colon obstruction. The mass also involves the left ureter causing an element of left ureteral dilatation worrisome for an element of left ureteral obstruction. The gas and fluid collection is very small measuring 1.8 x 1.2 cm on image 109 of series 4. Overall mass size including the involved colon is 5.7 x 5.3 cm. Normal appendix. Lymphatic: Several left iliac and left lower quadrant mesenteric lymph nodes are identified. The largest on image 92 measures 0.9 cm in short axis diameter. Reproductive: Post hysterectomy. The right ovary is not identified. A mass is present in the left adnexa as described above. Other: Noncontributory. Musculoskeletal: No vertebral compression. IMPRESSION: VASCULAR Atherosclerotic changes without obstruction. NON-VASCULAR There is a mass in the left hemipelvis involving the sigmoid colon and left pelvic soft tissues  resulting in colon obstruction and left ureteral obstruction. The mass does contain a focal gas and fluid collection consistent with perforation and abscess formation. It is small measuring up to 1.8 cm. Cholelithiasis. Electronically Signed   By: Marybelle Killings M.D.   On: 08/18/2017 15:12        Scheduled Meds: . diclofenac sodium  2 g Topical QID  . diltiazem  10 mg Intravenous Once  . [START ON 08/20/2017] levothyroxine  25 mcg Intravenous Daily  . pantoprazole (PROTONIX) IV  40 mg Intravenous Q24H   Continuous Infusions: . sodium chloride 125 mL/hr at 08/19/17 0759  . diltiazem (CARDIZEM) infusion    . piperacillin-tazobactam (ZOSYN)  IV Stopped (08/19/17 1209)  . potassium chloride Stopped (08/19/17 1159)     LOS: 1 day    Time spent: 25 mins,.More than 50% of that time was spent in counseling and/or coordination of care.      Shelly Coss, MD [facility] Pager [phone]  If 7PM-7AM, please contact night-coverage [email or web address] Password [facility] 08/19/2017, 12:18 PM

## 2017-08-20 DIAGNOSIS — I480 Paroxysmal atrial fibrillation: Secondary | ICD-10-CM

## 2017-08-20 DIAGNOSIS — Z951 Presence of aortocoronary bypass graft: Secondary | ICD-10-CM

## 2017-08-20 LAB — CBC WITH DIFFERENTIAL/PLATELET
Basophils Absolute: 0.1 10*3/uL (ref 0.0–0.1)
Basophils Relative: 0 %
Eosinophils Absolute: 0.1 10*3/uL (ref 0.0–0.7)
Eosinophils Relative: 1 %
HCT: 28.6 % — ABNORMAL LOW (ref 36.0–46.0)
Hemoglobin: 9.1 g/dL — ABNORMAL LOW (ref 12.0–15.0)
Lymphocytes Relative: 6 %
Lymphs Abs: 0.7 10*3/uL (ref 0.7–4.0)
MCH: 26.5 pg (ref 26.0–34.0)
MCHC: 31.8 g/dL (ref 30.0–36.0)
MCV: 83.4 fL (ref 78.0–100.0)
Monocytes Absolute: 0.7 10*3/uL (ref 0.1–1.0)
Monocytes Relative: 6 %
Neutro Abs: 10 10*3/uL — ABNORMAL HIGH (ref 1.7–7.7)
Neutrophils Relative %: 87 %
Platelets: 384 10*3/uL (ref 150–400)
RBC: 3.43 MIL/uL — ABNORMAL LOW (ref 3.87–5.11)
RDW: 14.5 % (ref 11.5–15.5)
WBC: 11.4 10*3/uL — ABNORMAL HIGH (ref 4.0–10.5)

## 2017-08-20 LAB — BASIC METABOLIC PANEL
Anion gap: 10 (ref 5–15)
BUN: <5 mg/dL — ABNORMAL LOW (ref 6–20)
CO2: 23 mmol/L (ref 22–32)
Calcium: 8.2 mg/dL — ABNORMAL LOW (ref 8.9–10.3)
Chloride: 98 mmol/L — ABNORMAL LOW (ref 101–111)
Creatinine, Ser: 0.63 mg/dL (ref 0.44–1.00)
GFR calc Af Amer: >60 mL/min (ref >60)
GFR calc non Af Amer: >60 mL/min (ref >60)
Glucose, Bld: 80 mg/dL (ref 65–99)
Potassium: 3.3 mmol/L — ABNORMAL LOW (ref 3.5–5.1)
Sodium: 131 mmol/L — ABNORMAL LOW (ref 135–145)

## 2017-08-20 LAB — TROPONIN I
Troponin I: 0.45 ng/mL (ref <0.03)
Troponin I: 0.28 ng/mL (ref <0.03)

## 2017-08-20 LAB — MAGNESIUM: Magnesium: 1.9 mg/dL (ref 1.7–2.4)

## 2017-08-20 MED ORDER — KCL-LACTATED RINGERS 20 MEQ/L IV SOLN
INTRAVENOUS | Status: DC
  Filled 2017-08-20: qty 1000

## 2017-08-20 MED ORDER — AMIODARONE HCL IN DEXTROSE 360-4.14 MG/200ML-% IV SOLN
60.0000 mg/h | INTRAVENOUS | Status: AC
Start: 2017-08-20 — End: 2017-08-20
  Administered 2017-08-20 (×2): 60 mg/h via INTRAVENOUS
  Filled 2017-08-20 (×2): qty 200

## 2017-08-20 MED ORDER — HYDRALAZINE HCL 20 MG/ML IJ SOLN
10.0000 mg | Freq: Four times a day (QID) | INTRAMUSCULAR | Status: DC | PRN
  Administered 2017-08-20 – 2017-08-24 (×6): 10 mg via INTRAVENOUS
  Filled 2017-08-20 (×6): qty 1

## 2017-08-20 MED ORDER — AMIODARONE HCL IN DEXTROSE 360-4.14 MG/200ML-% IV SOLN
30.0000 mg/h | INTRAVENOUS | Status: DC
  Administered 2017-08-20 – 2017-08-22 (×4): 30 mg/h via INTRAVENOUS
  Filled 2017-08-20 (×5): qty 200

## 2017-08-20 MED ORDER — AMIODARONE LOAD VIA INFUSION
150.0000 mg | Freq: Once | INTRAVENOUS | Status: AC
  Administered 2017-08-20: 150 mg via INTRAVENOUS
  Filled 2017-08-20: qty 83.34

## 2017-08-20 MED ORDER — POTASSIUM CHLORIDE 2 MEQ/ML IV SOLN
INTRAVENOUS | Status: DC
  Administered 2017-08-20 – 2017-08-21 (×2): via INTRAVENOUS
  Filled 2017-08-20 (×2): qty 1000

## 2017-08-20 MED ORDER — POTASSIUM CHLORIDE 10 MEQ/100ML IV SOLN
10.0000 meq | INTRAVENOUS | Status: AC
  Administered 2017-08-20 (×3): 10 meq via INTRAVENOUS
  Filled 2017-08-20 (×3): qty 100

## 2017-08-20 MED ORDER — PHENOL 1.4 % MT LIQD
1.0000 | OROMUCOSAL | Status: DC | PRN
  Filled 2017-08-20: qty 177

## 2017-08-20 MED ORDER — MORPHINE SULFATE (PF) 4 MG/ML IV SOLN
4.0000 mg | INTRAVENOUS | Status: DC | PRN
  Administered 2017-08-20 – 2017-08-26 (×26): 4 mg via INTRAVENOUS
  Filled 2017-08-20 (×29): qty 1

## 2017-08-20 NOTE — Progress Notes (Signed)
Subjective/Chief Complaint: Patient still with LLQ pain, distention, nausea No BM Adequate UOP K only 3.3 Children at bedside - have a lot of questions  Objective: Vital signs in last 24 hours: Temp:  [97.5 F (36.4 C)-98.8 F (37.1 C)] 97.5 F (36.4 C) (03/23 0750) Pulse Rate:  [69-102] 87 (03/23 0900) Resp:  [16-24] 19 (03/23 0900) BP: (133-179)/(36-88) 162/60 (03/23 0900) SpO2:  [96 %-100 %] 98 % (03/23 0900) Last BM Date: 08/17/17  Intake/Output from previous day: 03/22 0701 - 03/23 0700 In: 3585 [I.V.:2885; IV Piggyback:700] Out: 1300 [Urine:1300] Intake/Output this shift: No intake/output data recorded.  General appearance: alert, cooperative and no distress Resp: clear to auscultation bilaterally Cardio: regular rate and rhythm, S1, S2 normal, no murmur, click, rub or gallop GI: distended; tender in LLQ.    Lab Results:  Recent Labs    08/18/17 1142 08/20/17 0435  WBC 9.0 11.4*  HGB 10.7* 9.1*  HCT 32.4* 28.6*  PLT 515* 384   BMET Recent Labs    08/19/17 1655 08/20/17 0435  NA 132* 131*  K 3.2* 3.3*  CL 100* 98*  CO2 23 23  GLUCOSE 91 80  BUN <5* <5*  CREATININE 0.58 0.63  CALCIUM 7.4* 8.2*   PT/INR No results for input(s): LABPROT, INR in the last 72 hours. ABG No results for input(s): PHART, HCO3 in the last 72 hours.  Invalid input(s): PCO2, PO2  Studies/Results: [place] Ekg Site Rite  Result Date: 08/19/2017 If Site Rite image not attached, placement could not be confirmed due to current cardiac rhythm.  Ct Angio Abd/pel W And/or Wo Contrast  Result Date: 08/18/2017 CLINICAL DATA:  Vomiting.  Abdominal pain. EXAM: CTA ABDOMEN AND PELVIS wITHOUT AND WITH CONTRAST TECHNIQUE: Multidetector CT imaging of the abdomen and pelvis was performed using the standard protocol during bolus administration of intravenous contrast. Multiplanar reconstructed images and MIPs were obtained and reviewed to evaluate the vascular anatomy. CONTRAST:  153m  ISOVUE-370 IOPAMIDOL (ISOVUE-370) INJECTION 76% COMPARISON:  04/15/2016 FINDINGS: VASCULAR Aorta: There are atherosclerotic calcifications and irregular plaque in the abdominal aorta without aneurysmal dilatation or significant luminal narrowing. There is a penetrating ulcer in the right posterolateral aorta at the level of the renal arteries. Celiac: Patent. SMA: Patent. Renals: Single right and 2 left renal arteries are patent. IMA: Patent. Inflow: Diffuse atherosclerotic calcifications in the bilateral common iliac arteries without significant narrowing. Internal and external iliac arteries are patent with scattered atherosclerotic changes. Proximal Outflow: Grossly patent. Review of the MIP images confirms the above findings. NON-VASCULAR Lower chest: Dependent atelectasis. Hepatobiliary: Diffuse hepatic steatosis. Gallbladder is distended. A few small layering gallstones are noted Pancreas: Unremarkable Spleen: Unremarkable Adrenals/Urinary Tract: Moderate left hydronephrosis. Left hydroureter. Simple cyst in the right kidney. Adrenal glands are within normal limits. The left ureter is dilated to the left hemipelvis. It becomes narrowed in the inflammatory pelvic mass. This is described below. Bladder is within normal limits. Stomach/Bowel: Large hiatal hernia is unchanged. Stable prominent gastric folds within the hiatal hernia. The colon is diffusely distended. Distal small bowel is distended. A transition point between dilated large bowel and decompressed large bowel occurs in the sigmoid colon. There is a heterogeneous ill-defined mass involving the sigmoid colon and left side of the pelvis which includes the colon wall, adjacent fat, and both fluid and gas elements. Findings are suspicious for a focal perforation with abscess formation. Underlying malignancy is a strong consideration given the appearance and colon obstruction. The mass also involves the left  ureter causing an element of left ureteral  dilatation worrisome for an element of left ureteral obstruction. The gas and fluid collection is very small measuring 1.8 x 1.2 cm on image 109 of series 4. Overall mass size including the involved colon is 5.7 x 5.3 cm. Normal appendix. Lymphatic: Several left iliac and left lower quadrant mesenteric lymph nodes are identified. The largest on image 92 measures 0.9 cm in short axis diameter. Reproductive: Post hysterectomy. The right ovary is not identified. A mass is present in the left adnexa as described above. Other: Noncontributory. Musculoskeletal: No vertebral compression. IMPRESSION: VASCULAR Atherosclerotic changes without obstruction. NON-VASCULAR There is a mass in the left hemipelvis involving the sigmoid colon and left pelvic soft tissues resulting in colon obstruction and left ureteral obstruction. The mass does contain a focal gas and fluid collection consistent with perforation and abscess formation. It is small measuring up to 1.8 cm. Cholelithiasis. Electronically Signed   By: Marybelle Killings M.D.   On: 08/18/2017 15:12    Anti-infectives: Anti-infectives (From admission, onward)   Start     Dose/Rate Route Frequency Ordered Stop   08/19/17 0000  piperacillin-tazobactam (ZOSYN) IVPB 3.375 g     3.375 g 12.5 mL/hr over 240 Minutes Intravenous Every 8 hours 08/18/17 1703     08/18/17 1545  piperacillin-tazobactam (ZOSYN) IVPB 3.375 g     3.375 g 100 mL/hr over 30 Minutes Intravenous  Once 08/18/17 1540 08/18/17 1707      Assessment/Plan: History of endometrial cancer - s/p robotic hyst/BSO - Dr. Alycia Rossetti 12/30/15 History of left carotid disease Coronary artery disease Hypertension Hypothyroid Hyperlipidemia  Obstructing left colon mass withprobableperforation and abscess Chronic constipation Left ureter obstruction secondary to the mass. Atrial fibrillation with ST depression Hyponatremia - 131 this AM Hypokalemia - 3.3  I spoke with Urology - they will see her today.   Cardiology has determined high-risk for surgery.  Will tentatively plan laparoscopic diverting loop colostomy/ placement of pelvic drain/ cysto stent by Urology.  Surgery tomorrow.  FEN:  ice chips/IV fluids ID:  Zosyn 3/21 =>> day 3 DVT: SCD's only Foley:  Wick placed last PM Follow up:  TBD    LOS: 2 days    Shannon Scott 08/20/2017

## 2017-08-20 NOTE — Consult Note (Signed)
Urology Consult   Physician requesting consult: Donnie Mesa, MD  Reason for consult: Left hydronephrosis   History of Present Illness: Shannon Scott is a 77 y.o. female with multiple medical comorbidities who presented to the [facility] emergency department on 08/19/2017 with generalized abdominal pain that has been persistent for the past 72 hours.  She had a CT of the abdomen/pelvis during her emergency room evaluation and was found to have a large left-sided pelvic/colonic mass with evidence of a dilated left collecting system likely secondary to extrinsic compression from the pelvic mass.  She currently denies specific left-sided flank pain, but generalized abdominal pain.  She denies difficulty urinating, dysuria or hematuria.    The patient denies a history of voiding or storage urinary symptoms, hematuria, UTIs, STDs, urolithiasis, GU malignancy/trauma/surgery.  Past Medical History:  Diagnosis Date  . Arthritis   . Asthma    allergy induced  . Bilateral carotid artery disease (HCC)    L carotid bruit  . CAD (coronary artery disease)   . Cancer ([place])   . Dyslipidemia    intolerant to statins, welchol, niacin, zetia  . History of blood transfusion   . History of nuclear stress test 04/24/2012   lexiscan; normal study  . Hypertension   . Hypothyroidism   . Postoperative nausea and vomiting 01/02/2016    Past Surgical History:  Procedure Laterality Date  . ABDOMINAL HYSTERECTOMY    . Carotid Doppler  02/2012   40-59% right int carotid artery stenosis; 60-79% L int carotid stenosis; L carotid bruit  . CORONARY ARTERY BYPASS GRAFT  03/12/2004   LIMA to LAD, SVG to circumflex, SVG to PDA  . IR GENERIC HISTORICAL  04/29/2016   IR RADIOLOGIST EVAL & MGMT 04/29/2016 Sandi Mariscal, MD [facility] INTERV RAD  . IR GENERIC HISTORICAL  05/12/2016   IR RADIOLOGIST EVAL & MGMT 05/12/2016 Sandi Mariscal, MD [facility] INTERV RAD  . ROBOTIC ASSISTED TOTAL HYSTERECTOMY WITH BILATERAL SALPINGO  OOPHERECTOMY Bilateral 12/30/2015   Procedure: XI ROBOTIC ASSISTED TOTAL HYSTERECTOMY WITH BILATERAL SALPINGO OOPHORECTOMY WITH SENTAL LYMPH NODE BIOPSY;  Surgeon: Nancy Marus, MD;  Location: [facility]S;  Service: Gynecology;  Laterality: Bilateral;  . TONSILLECTOMY    . TRANSTHORACIC ECHOCARDIOGRAM  04/2007   EF>55%; mild MR; mild-mod TR; mild pulm HTN; mild calcification of aortiv valve leaflets with mild valvular aortic stenosis  . TUBAL LIGATION      Current Hospital Medications:  Home Meds:  Current Meds  Medication Sig  . acetaminophen (TYLENOL) 500 MG tablet Take 500 mg by mouth every 6 (six) hours as needed for mild pain, moderate pain, fever or headache.  . chlorthalidone (HYGROTON) 25 MG tablet take 1 tablet by mouth once daily  . levothyroxine (SYNTHROID, LEVOTHROID) 50 MCG tablet Take 50 mcg by mouth daily.   . polyethylene glycol (MIRALAX / GLYCOLAX) packet Take 17 g by mouth daily as needed for mild constipation.  . traMADol (ULTRAM) 50 MG tablet Take 50 mg by mouth every 12 (twelve) hours as needed for moderate pain.    Scheduled Meds: . Chlorhexidine Gluconate Cloth  6 each Topical Daily  . diclofenac sodium  2 g Topical QID  . diltiazem  10 mg Intravenous Once  . levothyroxine  25 mcg Intravenous Daily  . pantoprazole (PROTONIX) IV  40 mg Intravenous Q24H  . sodium chloride flush  10-40 mL Intracatheter Q12H   Continuous Infusions: . sodium chloride 125 mL/hr at 08/19/17 0759  . amiodarone 60 mg/hr (08/20/17 1109)  Followed by  . amiodarone    . diltiazem (CARDIZEM) infusion    . piperacillin-tazobactam (ZOSYN)  IV 3.375 g (08/20/17 0911)   PRN Meds:.hydrALAZINE, morphine injection, ondansetron (ZOFRAN) IV, phenol, sodium chloride flush  Allergies:  Allergies  Allergen Reactions  . Statins Other (See Comments)    Myalgias and memory problems  . Ciprofloxacin Itching    Splotchy redness with itching during IV infusion localized to arm.    Family History   Problem Relation Age of Onset  . Hypertension Mother   . Heart disease Mother        Died in her 37s  . Stroke Father   . Kidney disease Brother   . Heart disease Brother        also HTN, hyperlipidemia  . Heart attack Brother   . Stroke Sister        x2  . Hypertension Sister     Social History:  reports that she has never smoked. She has never used smokeless tobacco. She reports that she does not drink alcohol or use drugs.  ROS: A complete review of systems was performed.  All systems are negative except for pertinent findings as noted.  Physical Exam:  Vital signs in last 24 hours: Temp:  [97.5 F (36.4 C)-98.8 F (37.1 C)] 98.3 F (36.8 C) (03/23 1110) Pulse Rate:  [69-102] 87 (03/23 0900) Resp:  [16-24] 19 (03/23 0900) BP: (133-179)/(36-88) 162/60 (03/23 0900) SpO2:  [96 %-100 %] 98 % (03/23 0900) Constitutional:  Alert and oriented, No acute distress Cardiovascular: Regular rate and rhythm, No JVD Respiratory: Normal respiratory effort, Lungs clear bilaterally GI: Abdomen is soft, nontender, nondistended, no abdominal masses GU: No CVA tenderness Lymphatic: No lymphadenopathy Neurologic: Grossly intact, no focal deficits Psychiatric: Normal mood and affect  Laboratory Data:  Recent Labs    08/18/17 1142 08/20/17 0435  WBC 9.0 11.4*  HGB 10.7* 9.1*  HCT 32.4* 28.6*  PLT 515* 384    Recent Labs    08/18/17 1142 08/18/17 2232 08/19/17 0337 08/19/17 1655 08/20/17 0435  NA 123* 129* 130* 132* 131*  K 2.1* 2.7* 2.6* 3.2* 3.3*  CL 80* 91* 93* 100* 98*  GLUCOSE 123* 126* 112* 91 80  BUN 6 5* 5* <5* <5*  CALCIUM 9.1 8.4* 8.3* 7.4* 8.2*  CREATININE 0.58 0.65 0.68 0.58 0.63     Results for orders placed or performed during the hospital encounter of 08/18/17 (from the past 24 hour(s))  Troponin I     Status: Abnormal   Collection Time: 08/19/17  4:55 PM  Result Value Ref Range   Troponin I 0.56 (HH) <0.03 ng/mL  Basic metabolic panel     Status:  Abnormal   Collection Time: 08/19/17  4:55 PM  Result Value Ref Range   Sodium 132 (L) 135 - 145 mmol/L   Potassium 3.2 (L) 3.5 - 5.1 mmol/L   Chloride 100 (L) 101 - 111 mmol/L   CO2 23 22 - 32 mmol/L   Glucose, Bld 91 65 - 99 mg/dL   BUN <5 (L) 6 - 20 mg/dL   Creatinine, Ser 0.58 0.44 - 1.00 mg/dL   Calcium 7.4 (L) 8.9 - 10.3 mg/dL   GFR calc non Af Amer >60 >60 mL/min   GFR calc Af Amer >60 >60 mL/min   Anion gap 9 5 - 15  Magnesium     Status: None   Collection Time: 08/19/17  4:55 PM  Result Value Ref Range   Magnesium 1.8  1.7 - 2.4 mg/dL  Phosphorus     Status: Abnormal   Collection Time: 08/19/17  4:55 PM  Result Value Ref Range   Phosphorus 1.9 (L) 2.5 - 4.6 mg/dL  Troponin I     Status: Abnormal   Collection Time: 08/20/17 12:15 AM  Result Value Ref Range   Troponin I 0.45 (HH) <0.03 ng/mL  CBC with Differential/Platelet     Status: Abnormal   Collection Time: 08/20/17  4:35 AM  Result Value Ref Range   WBC 11.4 (H) 4.0 - 10.5 K/uL   RBC 3.43 (L) 3.87 - 5.11 MIL/uL   Hemoglobin 9.1 (L) 12.0 - 15.0 g/dL   HCT 28.6 (L) 36.0 - 46.0 %   MCV 83.4 78.0 - 100.0 fL   MCH 26.5 26.0 - 34.0 pg   MCHC 31.8 30.0 - 36.0 g/dL   RDW 14.5 11.5 - 15.5 %   Platelets 384 150 - 400 K/uL   Neutrophils Relative % 87 %   Neutro Abs 10.0 (H) 1.7 - 7.7 K/uL   Lymphocytes Relative 6 %   Lymphs Abs 0.7 0.7 - 4.0 K/uL   Monocytes Relative 6 %   Monocytes Absolute 0.7 0.1 - 1.0 K/uL   Eosinophils Relative 1 %   Eosinophils Absolute 0.1 0.0 - 0.7 K/uL   Basophils Relative 0 %   Basophils Absolute 0.1 0.0 - 0.1 K/uL  Basic metabolic panel     Status: Abnormal   Collection Time: 08/20/17  4:35 AM  Result Value Ref Range   Sodium 131 (L) 135 - 145 mmol/L   Potassium 3.3 (L) 3.5 - 5.1 mmol/L   Chloride 98 (L) 101 - 111 mmol/L   CO2 23 22 - 32 mmol/L   Glucose, Bld 80 65 - 99 mg/dL   BUN <5 (L) 6 - 20 mg/dL   Creatinine, Ser 0.63 0.44 - 1.00 mg/dL   Calcium 8.2 (L) 8.9 - 10.3 mg/dL    GFR calc non Af Amer >60 >60 mL/min   GFR calc Af Amer >60 >60 mL/min   Anion gap 10 5 - 15  Troponin I     Status: Abnormal   Collection Time: 08/20/17  4:35 AM  Result Value Ref Range   Troponin I 0.28 (HH) <0.03 ng/mL  Magnesium     Status: None   Collection Time: 08/20/17  4:35 AM  Result Value Ref Range   Magnesium 1.9 1.7 - 2.4 mg/dL   Recent Results (from the past 240 hour(s))  MRSA PCR Screening     Status: None   Collection Time: 08/18/17  7:43 PM  Result Value Ref Range Status   MRSA by PCR NEGATIVE NEGATIVE Final    Comment:        The GeneXpert MRSA Assay (FDA approved for NASAL specimens only), is one component of a comprehensive MRSA colonization surveillance program. It is not intended to diagnose MRSA infection nor to guide or monitor treatment for MRSA infections. Performed at [facility], [place] [address], [place], [place] [address]   Culture, blood (routine x 2)     Status: None (Preliminary result)   Collection Time: 08/18/17  7:54 PM  Result Value Ref Range Status   Specimen Description   Final    BLOOD RIGHT ANTECUBITAL Performed at [facility] [address], [place], [place] [address]    Special Requests   Final    BOTTLES DRAWN AEROBIC AND ANAEROBIC Blood Culture adequate volume Performed at [facility]  [place] [address] [place], [place] [address]    Culture   Final    NO GROWTH < 24 HOURS Performed at [facility] [address] [place], [place] [address]    Report Status PENDING  Incomplete  Culture, blood (routine x 2)     Status: None (Preliminary result)   Collection Time: 08/18/17  7:54 PM  Result Value Ref Range Status   Specimen Description   Final    BLOOD RIGHT FOREARM Performed at [facility] [address], [place], [place] [address]    Special Requests   Final    BOTTLES DRAWN AEROBIC ONLY Blood Culture results may not be optimal due to an  inadequate volume of blood received in culture bottles Performed at [facility] [address], [place], [place] [address]    Culture   Final    NO GROWTH < 24 HOURS Performed at [facility] [address], [place], [place] [address]    Report Status PENDING  Incomplete    Renal Function: Recent Labs    08/18/17 1142 08/18/17 2232 08/19/17 0337 08/19/17 1655 08/20/17 0435  CREATININE 0.58 0.65 0.68 0.58 0.63   CrCl cannot be calculated (Unknown ideal weight.).  Radiologic Imaging: [place] Ekg Site Rite  Result Date: 08/19/2017 If Site Rite image not attached, placement could not be confirmed due to current cardiac rhythm.  Ct Angio Abd/pel W And/or Wo Contrast  Result Date: 08/18/2017 CLINICAL DATA:  Vomiting.  Abdominal pain. EXAM: CTA ABDOMEN AND PELVIS wITHOUT AND WITH CONTRAST TECHNIQUE: Multidetector CT imaging of the abdomen and pelvis was performed using the standard protocol during bolus administration of intravenous contrast. Multiplanar reconstructed images and MIPs were obtained and reviewed to evaluate the vascular anatomy. CONTRAST:  137m ISOVUE-370 IOPAMIDOL (ISOVUE-370) INJECTION 76% COMPARISON:  04/15/2016 FINDINGS: VASCULAR Aorta: There are atherosclerotic calcifications and irregular plaque in the abdominal aorta without aneurysmal dilatation or significant luminal narrowing. There is a penetrating ulcer in the right posterolateral aorta at the level of the renal arteries. Celiac: Patent. SMA: Patent. Renals: Single right and 2 left renal arteries are patent. IMA: Patent. Inflow: Diffuse atherosclerotic calcifications in the bilateral common iliac arteries without significant narrowing. Internal and external iliac arteries are patent with scattered atherosclerotic changes. Proximal Outflow: Grossly patent. Review of the MIP images confirms the above findings. NON-VASCULAR Lower chest: Dependent atelectasis. Hepatobiliary: Diffuse hepatic  steatosis. Gallbladder is distended. A few small layering gallstones are noted Pancreas: Unremarkable Spleen: Unremarkable Adrenals/Urinary Tract: Moderate left hydronephrosis. Left hydroureter. Simple cyst in the right kidney. Adrenal glands are within normal limits. The left ureter is dilated to the left hemipelvis. It becomes narrowed in the inflammatory pelvic mass. This is described below. Bladder is within normal limits. Stomach/Bowel: Large hiatal hernia is unchanged. Stable prominent gastric folds within the hiatal hernia. The colon is diffusely distended. Distal small bowel is distended. A transition point between dilated large bowel and decompressed large bowel occurs in the sigmoid colon. There is a heterogeneous ill-defined mass involving the sigmoid colon and left side of the pelvis which includes the colon wall, adjacent fat, and both fluid and gas elements. Findings are suspicious for a focal perforation with abscess formation. Underlying malignancy is a strong consideration given the appearance and colon obstruction. The mass also involves the left ureter causing an element of left ureteral dilatation worrisome for an element of left ureteral obstruction. The gas and fluid collection is very  small measuring 1.8 x 1.2 cm on image 109 of series 4. Overall mass size including the involved colon is 5.7 x 5.3 cm. Normal appendix. Lymphatic: Several left iliac and left lower quadrant mesenteric lymph nodes are identified. The largest on image 92 measures 0.9 cm in short axis diameter. Reproductive: Post hysterectomy. The right ovary is not identified. A mass is present in the left adnexa as described above. Other: Noncontributory. Musculoskeletal: No vertebral compression. IMPRESSION: VASCULAR Atherosclerotic changes without obstruction. NON-VASCULAR There is a mass in the left hemipelvis involving the sigmoid colon and left pelvic soft tissues resulting in colon obstruction and left ureteral obstruction.  The mass does contain a focal gas and fluid collection consistent with perforation and abscess formation. It is small measuring up to 1.8 cm. Cholelithiasis. Electronically Signed   By: Marybelle Killings M.D.   On: 08/18/2017 15:12    I independently reviewed the above imaging studies.  Impression/Recommendation 77 year old female with a history of CAD s/p CABG, endometrial cancer s/p TAH with a large left sided colonic mass resulting in left sided hydronephrosis, likely 2/2 extrinsic compression  -I spoke with Dr. Georgette Dover with general surgery and he is planning on performing a diverting colostomy tomorrow -The risk, benefits and alternatives of cystoscopy with left retrograde pyelogram and left JJ stent placement was discussed with the patient and her family members at bedside.  Risks include, but are not limited to, bleeding, urinary tract infection, stent related flank pain, the inability to place a ureteral stent in a retrograde fashion, stent migration, MI, stroke, PE and the inherent risk of general anesthesia.  She voices understanding and wishes to proceed. -The patient is deemed high risk by cardiology   Ellison Hughs, MD [facility] 08/20/2017, 12:41 PM

## 2017-08-20 NOTE — Progress Notes (Addendum)
PROGRESS NOTE [facility]   Shannon Scott   KKX:[id number] DOB: 1940/10/17  DOA: 08/18/2017 PCP: [name], MD   Brief Narrative:  Shannon Scott is a 77 year old female with medical history significant for coronary artery disease status post CABG, endometrial cancer status post total hysterectomy, hypothyroidism who presented to the emergency department complaining of generalized abdominal pain nausea and vomiting.  CT scan of the abdomen and pelvis showed heterogeneous ill defined mass involving the sigmoid colon in the left pelvic with suspicious perforation and abscess formation.  Patient also was found to be on new onset A. fib with RVR.  Patient was admitted with working diagnosis of colon mass complicated with A. fib with RVR.  General surgery and cardiology has been consulted.  On CT was noted that the mass was compressing left ureter causing dilation therefore urology was consulted for possible stent placement.  Subjective: Patient seen and examined, continues to complain of abdominal pain.  No other concerns.  Denies chest pain, shortness of breath and palpitation.  Remains afebrile.  Not able to pass gas or bowel movement  Assessment & Plan: Colon mass Likely malignancy Patient with history of endometrial cancer 2 years ago with total hysterectomy Surgical team planning for laparoscopic diverting colostomy in a.m. cardiology evaluated patient and determined the patient is high risk for surgery. Continue pain control.  Bowel perforation/abscess CT scan report focal gas and fluid collection consistent with bowel perforation.  Patient currently on Zosyn.  We will continue antibiotics for now.  Follow-up blood cultures.  Patient remains afebrile.  Continue supportive treatment  Ureteral dilation/mild hydronephrosis I have spoke with urology they will see the patient today for possible stent placement.  Creatinine stable Patient with good urine output  A. fib  with RVR New onset Cardiology following patient on Cardizem and amiodarone heart rate stable Not on anticoagulation at this time in view of surgical procedure  Hypertension BP elevated, patient not on her home medications due to n.p.o. status We will add hydralazine as needed for SBP above 160.  Pain probably contributing to elevation in blood pressure  Hypokalemia Will change fluids to LR with 20 of KCl Keep potassium above 4 Check BMP and mag in the morning  DVT prophylaxis: SCDs Code Status: Full code Family Communication: Daughter at bedside Disposition Plan: Home when recovered from surgery  Consultants:   General surgery  Urology  Cardiology  Procedures:   Antimicrobials:  Zosyn 3/19 ->   Objective: Vitals:   08/20/17 0750 08/20/17 0900 08/20/17 1110 08/20/17 1300  BP:  (!) 162/60  (!) 175/56  Pulse:  87  82  Resp:  19  20  Temp: (!) 97.5 F (36.4 C)  98.3 F (36.8 C)   TempSrc: Oral  Axillary   SpO2:  98%  99%    Intake/Output Summary (Last 24 hours) at 08/20/2017 1440 Last data filed at 08/20/2017 0400 Gross per 24 hour  Intake 2150 ml  Output 400 ml  Net 1750 ml   There were no vitals filed for this visit.  Examination:  General exam: Appears calm and comfortable  HEENT: AC/AT, PERRLA, OP moist and clear Respiratory system: Clear to auscultation. No wheezes,crackle or rhonchi Cardiovascular system: S1-S2, irregularly irregular, positive systolic murmur, no JVD Gastrointestinal system: Abdomen slight distended, tender on right upper and lower quadrant.  Positive bowel sounds Central nervous system: Alert and oriented. No focal neurological deficits. Extremities: No pedal edema. Skin: No rashes Psychiatry: Mood & affect appropriate.  Data Reviewed: I have personally reviewed following labs and imaging studies  CBC: Recent Labs  Lab 08/18/17 1142 08/20/17 0435  WBC 9.0 11.4*  NEUTROABS  --  10.0*  HGB 10.7* 9.1*  HCT 32.4* 28.6*    MCV 81.0 83.4  PLT 515* 716   Basic Metabolic Panel: Recent Labs  Lab 08/18/17 1142 08/18/17 1255 08/18/17 2232 08/19/17 0337 08/19/17 1655 08/20/17 0435  NA 123*  --  129* 130* 132* 131*  K 2.1*  --  2.7* 2.6* 3.2* 3.3*  CL 80*  --  91* 93* 100* 98*  CO2 29  --  27 26 23 23   GLUCOSE 123*  --  126* 112* 91 80  BUN 6  --  5* 5* <5* <5*  CREATININE 0.58  --  0.65 0.68 0.58 0.63  CALCIUM 9.1  --  8.4* 8.3* 7.4* 8.2*  MG  --  1.8  --  2.4 1.8 1.9  PHOS  --   --   --   --  1.9*  --    GFR: CrCl cannot be calculated (Unknown ideal weight.). Liver Function Tests: Recent Labs  Lab 08/18/17 1142 08/19/17 0337  AST 28 23  ALT 12* 11*  ALKPHOS 100 79  BILITOT 0.4 0.6  PROT 7.9 6.1*  ALBUMIN 3.8 2.9*   Recent Labs  Lab 08/18/17 1142  LIPASE 21   No results for input(s): AMMONIA in the last 168 hours. Coagulation Profile: No results for input(s): INR, PROTIME in the last 168 hours. Cardiac Enzymes: Recent Labs  Lab 08/19/17 1241 08/19/17 1655 08/20/17 0015 08/20/17 0435  TROPONINI 0.77* 0.56* 0.45* 0.28*   BNP (last 3 results) No results for input(s): PROBNP in the last 8760 hours. HbA1C: No results for input(s): HGBA1C in the last 72 hours. CBG: No results for input(s): GLUCAP in the last 168 hours. Lipid Profile: No results for input(s): CHOL, HDL, LDLCALC, TRIG, CHOLHDL, LDLDIRECT in the last 72 hours. Thyroid Function Tests: No results for input(s): TSH, T4TOTAL, FREET4, T3FREE, THYROIDAB in the last 72 hours. Anemia Panel: No results for input(s): VITAMINB12, FOLATE, FERRITIN, TIBC, IRON, RETICCTPCT in the last 72 hours. Sepsis Labs: Recent Labs  Lab 08/18/17 1317 08/18/17 1652  LATICACIDVEN 0.97 0.97    Recent Results (from the past 240 hour(s))  MRSA PCR Screening     Status: None   Collection Time: 08/18/17  7:43 PM  Result Value Ref Range Status   MRSA by PCR NEGATIVE NEGATIVE Final    Comment:        The GeneXpert MRSA Assay  (FDA approved for NASAL specimens only), is one component of a comprehensive MRSA colonization surveillance program. It is not intended to diagnose MRSA infection nor to guide or monitor treatment for MRSA infections. Performed at [facility], [place] [address] [place], [place] [address]   Culture, blood (routine x 2)     Status: None (Preliminary result)   Collection Time: 08/18/17  7:54 PM  Result Value Ref Range Status   Specimen Description   Final    BLOOD RIGHT ANTECUBITAL Performed at [facility] [address] [place], [place] [address]    Special Requests   Final    BOTTLES DRAWN AEROBIC AND ANAEROBIC Blood Culture adequate volume Performed at [facility] [address], [place], [place] [address]    Culture   Final    NO GROWTH 2 DAYS Performed at [facility] [address]  [place], [place] [address]    Report Status PENDING  Incomplete  Culture, blood (routine x 2)     Status: None (Preliminary result)   Collection Time: 08/18/17  7:54 PM  Result Value Ref Range Status   Specimen Description   Final    BLOOD RIGHT FOREARM Performed at [facility] [address] [place], [place] [address]    Special Requests   Final    BOTTLES DRAWN AEROBIC ONLY Blood Culture results may not be optimal due to an inadequate volume of blood received in culture bottles Performed at [facility] [address], [place], [place] [address]    Culture   Final    NO GROWTH 2 DAYS Performed at [facility] [address], [place], [place] [address]    Report Status PENDING  Incomplete      Radiology Studies: [place] Ekg Site Rite  Result Date: 08/19/2017 If Site Rite image not attached, placement could not be confirmed due to current cardiac rhythm.  Ct Angio Abd/pel W And/or Wo Contrast  Result Date: 08/18/2017 CLINICAL DATA:  Vomiting.  Abdominal pain. EXAM: CTA  ABDOMEN AND PELVIS wITHOUT AND WITH CONTRAST TECHNIQUE: Multidetector CT imaging of the abdomen and pelvis was performed using the standard protocol during bolus administration of intravenous contrast. Multiplanar reconstructed images and MIPs were obtained and reviewed to evaluate the vascular anatomy. CONTRAST:  172m ISOVUE-370 IOPAMIDOL (ISOVUE-370) INJECTION 76% COMPARISON:  04/15/2016 FINDINGS: VASCULAR Aorta: There are atherosclerotic calcifications and irregular plaque in the abdominal aorta without aneurysmal dilatation or significant luminal narrowing. There is a penetrating ulcer in the right posterolateral aorta at the level of the renal arteries. Celiac: Patent. SMA: Patent. Renals: Single right and 2 left renal arteries are patent. IMA: Patent. Inflow: Diffuse atherosclerotic calcifications in the bilateral common iliac arteries without significant narrowing. Internal and external iliac arteries are patent with scattered atherosclerotic changes. Proximal Outflow: Grossly patent. Review of the MIP images confirms the above findings. NON-VASCULAR Lower chest: Dependent atelectasis. Hepatobiliary: Diffuse hepatic steatosis. Gallbladder is distended. A few small layering gallstones are noted Pancreas: Unremarkable Spleen: Unremarkable Adrenals/Urinary Tract: Moderate left hydronephrosis. Left hydroureter. Simple cyst in the right kidney. Adrenal glands are within normal limits. The left ureter is dilated to the left hemipelvis. It becomes narrowed in the inflammatory pelvic mass. This is described below. Bladder is within normal limits. Stomach/Bowel: Large hiatal hernia is unchanged. Stable prominent gastric folds within the hiatal hernia. The colon is diffusely distended. Distal small bowel is distended. A transition point between dilated large bowel and decompressed large bowel occurs in the sigmoid colon. There is a heterogeneous ill-defined mass involving the sigmoid colon and left side of the pelvis  which includes the colon wall, adjacent fat, and both fluid and gas elements. Findings are suspicious for a focal perforation with abscess formation. Underlying malignancy is a strong consideration given the appearance and colon obstruction. The mass also involves the left ureter causing an element of left ureteral dilatation worrisome for an element of left ureteral obstruction. The gas and fluid collection is very small measuring 1.8 x 1.2 cm on image 109 of series 4. Overall mass size including the involved colon is 5.7 x 5.3 cm. Normal appendix. Lymphatic: Several left iliac and left lower quadrant mesenteric lymph nodes are identified. The largest on image 92 measures 0.9 cm in short axis diameter. Reproductive: Post hysterectomy. The right ovary is not identified. A mass is present in the left adnexa  as described above. Other: Noncontributory. Musculoskeletal: No vertebral compression. IMPRESSION: VASCULAR Atherosclerotic changes without obstruction. NON-VASCULAR There is a mass in the left hemipelvis involving the sigmoid colon and left pelvic soft tissues resulting in colon obstruction and left ureteral obstruction. The mass does contain a focal gas and fluid collection consistent with perforation and abscess formation. It is small measuring up to 1.8 cm. Cholelithiasis. Electronically Signed   By: Marybelle Killings M.D.   On: 08/18/2017 15:12    Scheduled Meds: . Chlorhexidine Gluconate Cloth  6 each Topical Daily  . diclofenac sodium  2 g Topical QID  . diltiazem  10 mg Intravenous Once  . levothyroxine  25 mcg Intravenous Daily  . pantoprazole (PROTONIX) IV  40 mg Intravenous Q24H  . sodium chloride flush  10-40 mL Intracatheter Q12H   Continuous Infusions: . amiodarone 60 mg/hr (08/20/17 1416)   Followed by  . amiodarone    . diltiazem (CARDIZEM) infusion    . lactated ringers with KCl 20 mEq/L    . piperacillin-tazobactam (ZOSYN)  IV Stopped (08/20/17 1314)     LOS: 2 days    Time  spent: Total of 25 minutes spent with pt, greater than 50% of which was spent in discussion of  treatment, counseling and coordination of care  Chipper Oman, MD Pager: Text Page via [email or web address]   If 7PM-7AM, please contact night-coverage [email or web address] 08/20/2017, 2:40 PM   Note - This record has been created using [employer]. Chart creation errors have been sought, but may not always have been located. Such creation errors do not reflect on the standard of medical care.

## 2017-08-20 NOTE — Progress Notes (Signed)
Progress Note  Patient Name: Shannon Scott Date of Encounter: 08/20/2017  Primary Cardiologist:   Pixie Casino, MD   Subjective   No cardiac complaints Daughter and patient frustrated about lack of plan for surgery   Inpatient Medications    Scheduled Meds: . amiodarone  150 mg Intravenous Once  . Chlorhexidine Gluconate Cloth  6 each Topical Daily  . diclofenac sodium  2 g Topical QID  . diltiazem  10 mg Intravenous Once  . levothyroxine  25 mcg Intravenous Daily  . pantoprazole (PROTONIX) IV  40 mg Intravenous Q24H  . sodium chloride flush  10-40 mL Intracatheter Q12H   Continuous Infusions: . sodium chloride 125 mL/hr at 08/19/17 0759  . amiodarone     Followed by  . amiodarone    . diltiazem (CARDIZEM) infusion    . piperacillin-tazobactam (ZOSYN)  IV 3.375 g (08/20/17 0911)  . potassium chloride 10 mEq (08/20/17 0911)   PRN Meds: hydrALAZINE, morphine injection, ondansetron (ZOFRAN) IV, sodium chloride flush   Vital Signs    Vitals:   08/20/17 0500 08/20/17 0600 08/20/17 0750 08/20/17 0900  BP: (!) 147/43 (!) 147/46  (!) 162/60  Pulse: 87 81  87  Resp: 18 16  19   Temp:   (!) 97.5 F (36.4 C)   TempSrc:   Oral   SpO2: 98% 99%  98%    Intake/Output Summary (Last 24 hours) at 08/20/2017 1700 Last data filed at 08/20/2017 0400 Gross per 24 hour  Intake 3185 ml  Output 700 ml  Net 2485 ml   There were no vitals filed for this visit.  Telemetry    NSR rates 90 08/20/2017   ECG     - rapid flutter with diffuse ST changes   Physical Exam   Affect appropriate Pale female  HEENT: normal Neck supple with no adenopathy JVP normal no bruits no thyromegaly Lungs clear with no wheezing and good diaphragmatic motion Heart:  S1/SAS  murmur, no rub, gallop or click PMI normal Abdomen: diffuse tenderness with colostomy markers no rebound  Distal pulses intact with no bruits No edema Neuro non-focal Skin warm and dry No muscular  weakness   Labs    Chemistry Recent Labs  Lab 08/18/17 1142  08/19/17 0337 08/19/17 1655 08/20/17 0435  NA 123*   < > 130* 132* 131*  K 2.1*   < > 2.6* 3.2* 3.3*  CL 80*   < > 93* 100* 98*  CO2 29   < > 26 23 23   GLUCOSE 123*   < > 112* 91 80  BUN 6   < > 5* <5* <5*  CREATININE 0.58   < > 0.68 0.58 0.63  CALCIUM 9.1   < > 8.3* 7.4* 8.2*  PROT 7.9  --  6.1*  --   --   ALBUMIN 3.8  --  2.9*  --   --   AST 28  --  23  --   --   ALT 12*  --  11*  --   --   ALKPHOS 100  --  79  --   --   BILITOT 0.4  --  0.6  --   --   GFRNONAA >60   < > >60 >60 >60  GFRAA >60   < > >60 >60 >60  ANIONGAP 14   < > 11 9 10    < > = values in this interval not displayed.     Hematology Recent Labs  Lab 08/18/17 1142 08/20/17 0435  WBC 9.0 11.4*  RBC 4.00 3.43*  HGB 10.7* 9.1*  HCT 32.4* 28.6*  MCV 81.0 83.4  MCH 26.8 26.5  MCHC 33.0 31.8  RDW 13.8 14.5  PLT 515* 384    Cardiac Enzymes Recent Labs  Lab 08/19/17 1241 08/19/17 1655 08/20/17 0015 08/20/17 0435  TROPONINI 0.77* 0.56* 0.45* 0.28*    Recent Labs  Lab 08/18/17 1650  TROPIPOC 0.08     BNPNo results for input(s): BNP, PROBNP in the last 168 hours.   DDimer No results for input(s): DDIMER in the last 168 hours.   Radiology    [place] Ekg Site Rite  Result Date: 08/19/2017 If [facility] image not attached, placement could not be confirmed due to current cardiac rhythm.  Ct Angio Abd/pel W And/or Wo Contrast  Result Date: 08/18/2017 CLINICAL DATA:  Vomiting.  Abdominal pain. EXAM: CTA ABDOMEN AND PELVIS wITHOUT AND WITH CONTRAST TECHNIQUE: Multidetector CT imaging of the abdomen and pelvis was performed using the standard protocol during bolus administration of intravenous contrast. Multiplanar reconstructed images and MIPs were obtained and reviewed to evaluate the vascular anatomy. CONTRAST:  145m ISOVUE-370 IOPAMIDOL (ISOVUE-370) INJECTION 76% COMPARISON:  04/15/2016 FINDINGS: VASCULAR Aorta: There are  atherosclerotic calcifications and irregular plaque in the abdominal aorta without aneurysmal dilatation or significant luminal narrowing. There is a penetrating ulcer in the right posterolateral aorta at the level of the renal arteries. Celiac: Patent. SMA: Patent. Renals: Single right and 2 left renal arteries are patent. IMA: Patent. Inflow: Diffuse atherosclerotic calcifications in the bilateral common iliac arteries without significant narrowing. Internal and external iliac arteries are patent with scattered atherosclerotic changes. Proximal Outflow: Grossly patent. Review of the MIP images confirms the above findings. NON-VASCULAR Lower chest: Dependent atelectasis. Hepatobiliary: Diffuse hepatic steatosis. Gallbladder is distended. A few small layering gallstones are noted Pancreas: Unremarkable Spleen: Unremarkable Adrenals/Urinary Tract: Moderate left hydronephrosis. Left hydroureter. Simple cyst in the right kidney. Adrenal glands are within normal limits. The left ureter is dilated to the left hemipelvis. It becomes narrowed in the inflammatory pelvic mass. This is described below. Bladder is within normal limits. Stomach/Bowel: Large hiatal hernia is unchanged. Stable prominent gastric folds within the hiatal hernia. The colon is diffusely distended. Distal small bowel is distended. A transition point between dilated large bowel and decompressed large bowel occurs in the sigmoid colon. There is a heterogeneous ill-defined mass involving the sigmoid colon and left side of the pelvis which includes the colon wall, adjacent fat, and both fluid and gas elements. Findings are suspicious for a focal perforation with abscess formation. Underlying malignancy is a strong consideration given the appearance and colon obstruction. The mass also involves the left ureter causing an element of left ureteral dilatation worrisome for an element of left ureteral obstruction. The gas and fluid collection is very small  measuring 1.8 x 1.2 cm on image 109 of series 4. Overall mass size including the involved colon is 5.7 x 5.3 cm. Normal appendix. Lymphatic: Several left iliac and left lower quadrant mesenteric lymph nodes are identified. The largest on image 92 measures 0.9 cm in short axis diameter. Reproductive: Post hysterectomy. The right ovary is not identified. A mass is present in the left adnexa as described above. Other: Noncontributory. Musculoskeletal: No vertebral compression. IMPRESSION: VASCULAR Atherosclerotic changes without obstruction. NON-VASCULAR There is a mass in the left hemipelvis involving the sigmoid colon and left pelvic soft tissues resulting in colon obstruction and left ureteral obstruction. The mass  does contain a focal gas and fluid collection consistent with perforation and abscess formation. It is small measuring up to 1.8 cm. Cholelithiasis. Electronically Signed   By: Marybelle Killings M.D.   On: 08/18/2017 15:12    Cardiac Studies   ECHO:  Pending  Patient Profile     77 y.o. female  with a hx of CAD and PAF preop for colonic surgery   Assessment & Plan    PAF:  NSR this am continue iv cardizem Added iv amiodarone this am to try to prevent perioperative arrhythmia   CAD:  .CABG 2005 non ischemic myovue 2013 Small bump in troponin with rapid afib See note by [facility] ok for surgery high risk. Will continue iv cardizem and start iv amiodarone She is not likely to be ischemic if she maintains NSR She needs K supplemented and will likely need transfusion to keep Hb over 10 post op. Discussed with patient and daughter  EF normal by echo with no RWMAls and AV sclerosis reviewed from 08/19/17    Signed, Jenkins Rouge, MD  08/20/2017, 9:22 AM

## 2017-08-20 NOTE — H&P (View-Only) (Signed)
Subjective/Chief Complaint: Patient still with LLQ pain, distention, nausea No BM Adequate UOP K only 3.3 Children at bedside - have a lot of questions  Objective: Vital signs in last 24 hours: Temp:  [97.5 F (36.4 C)-98.8 F (37.1 C)] 97.5 F (36.4 C) (03/23 0750) Pulse Rate:  [69-102] 87 (03/23 0900) Resp:  [16-24] 19 (03/23 0900) BP: (133-179)/(36-88) 162/60 (03/23 0900) SpO2:  [96 %-100 %] 98 % (03/23 0900) Last BM Date: 08/17/17  Intake/Output from previous day: 03/22 0701 - 03/23 0700 In: 3585 [I.V.:2885; IV Piggyback:700] Out: 1300 [Urine:1300] Intake/Output this shift: No intake/output data recorded.  General appearance: alert, cooperative and no distress Resp: clear to auscultation bilaterally Cardio: regular rate and rhythm, S1, S2 normal, no murmur, click, rub or gallop GI: distended; tender in LLQ.    Lab Results:  Recent Labs    08/18/17 1142 08/20/17 0435  WBC 9.0 11.4*  HGB 10.7* 9.1*  HCT 32.4* 28.6*  PLT 515* 384   BMET Recent Labs    08/19/17 1655 08/20/17 0435  NA 132* 131*  K 3.2* 3.3*  CL 100* 98*  CO2 23 23  GLUCOSE 91 80  BUN <5* <5*  CREATININE 0.58 0.63  CALCIUM 7.4* 8.2*   PT/INR No results for input(s): LABPROT, INR in the last 72 hours. ABG No results for input(s): PHART, HCO3 in the last 72 hours.  Invalid input(s): PCO2, PO2  Studies/Results: [place] Ekg Site Rite  Result Date: 08/19/2017 If Site Rite image not attached, placement could not be confirmed due to current cardiac rhythm.  Ct Angio Abd/pel W And/or Wo Contrast  Result Date: 08/18/2017 CLINICAL DATA:  Vomiting.  Abdominal pain. EXAM: CTA ABDOMEN AND PELVIS wITHOUT AND WITH CONTRAST TECHNIQUE: Multidetector CT imaging of the abdomen and pelvis was performed using the standard protocol during bolus administration of intravenous contrast. Multiplanar reconstructed images and MIPs were obtained and reviewed to evaluate the vascular anatomy. CONTRAST:  14m  ISOVUE-370 IOPAMIDOL (ISOVUE-370) INJECTION 76% COMPARISON:  04/15/2016 FINDINGS: VASCULAR Aorta: There are atherosclerotic calcifications and irregular plaque in the abdominal aorta without aneurysmal dilatation or significant luminal narrowing. There is a penetrating ulcer in the right posterolateral aorta at the level of the renal arteries. Celiac: Patent. SMA: Patent. Renals: Single right and 2 left renal arteries are patent. IMA: Patent. Inflow: Diffuse atherosclerotic calcifications in the bilateral common iliac arteries without significant narrowing. Internal and external iliac arteries are patent with scattered atherosclerotic changes. Proximal Outflow: Grossly patent. Review of the MIP images confirms the above findings. NON-VASCULAR Lower chest: Dependent atelectasis. Hepatobiliary: Diffuse hepatic steatosis. Gallbladder is distended. A few small layering gallstones are noted Pancreas: Unremarkable Spleen: Unremarkable Adrenals/Urinary Tract: Moderate left hydronephrosis. Left hydroureter. Simple cyst in the right kidney. Adrenal glands are within normal limits. The left ureter is dilated to the left hemipelvis. It becomes narrowed in the inflammatory pelvic mass. This is described below. Bladder is within normal limits. Stomach/Bowel: Large hiatal hernia is unchanged. Stable prominent gastric folds within the hiatal hernia. The colon is diffusely distended. Distal small bowel is distended. A transition point between dilated large bowel and decompressed large bowel occurs in the sigmoid colon. There is a heterogeneous ill-defined mass involving the sigmoid colon and left side of the pelvis which includes the colon wall, adjacent fat, and both fluid and gas elements. Findings are suspicious for a focal perforation with abscess formation. Underlying malignancy is a strong consideration given the appearance and colon obstruction. The mass also involves the left  ureter causing an element of left ureteral  dilatation worrisome for an element of left ureteral obstruction. The gas and fluid collection is very small measuring 1.8 x 1.2 cm on image 109 of series 4. Overall mass size including the involved colon is 5.7 x 5.3 cm. Normal appendix. Lymphatic: Several left iliac and left lower quadrant mesenteric lymph nodes are identified. The largest on image 92 measures 0.9 cm in short axis diameter. Reproductive: Post hysterectomy. The right ovary is not identified. A mass is present in the left adnexa as described above. Other: Noncontributory. Musculoskeletal: No vertebral compression. IMPRESSION: VASCULAR Atherosclerotic changes without obstruction. NON-VASCULAR There is a mass in the left hemipelvis involving the sigmoid colon and left pelvic soft tissues resulting in colon obstruction and left ureteral obstruction. The mass does contain a focal gas and fluid collection consistent with perforation and abscess formation. It is small measuring up to 1.8 cm. Cholelithiasis. Electronically Signed   By: Marybelle Killings M.D.   On: 08/18/2017 15:12    Anti-infectives: Anti-infectives (From admission, onward)   Start     Dose/Rate Route Frequency Ordered Stop   08/19/17 0000  piperacillin-tazobactam (ZOSYN) IVPB 3.375 g     3.375 g 12.5 mL/hr over 240 Minutes Intravenous Every 8 hours 08/18/17 1703     08/18/17 1545  piperacillin-tazobactam (ZOSYN) IVPB 3.375 g     3.375 g 100 mL/hr over 30 Minutes Intravenous  Once 08/18/17 1540 08/18/17 1707      Assessment/Plan: History of endometrial cancer - s/p robotic hyst/BSO - Dr. Alycia Rossetti 12/30/15 History of left carotid disease Coronary artery disease Hypertension Hypothyroid Hyperlipidemia  Obstructing left colon mass withprobableperforation and abscess Chronic constipation Left ureter obstruction secondary to the mass. Atrial fibrillation with ST depression Hyponatremia - 131 this AM Hypokalemia - 3.3  I spoke with Urology - they will see her today.   Cardiology has determined high-risk for surgery.  Will tentatively plan laparoscopic diverting loop colostomy/ placement of pelvic drain/ cysto stent by Urology.  Surgery tomorrow.  FEN:  ice chips/IV fluids ID:  Zosyn 3/21 =>> day 3 DVT: SCD's only Foley:  Wick placed last PM Follow up:  TBD    LOS: 2 days    Shannon Scott 08/20/2017

## 2017-08-21 ENCOUNTER — Inpatient Hospital Stay (HOSPITAL_COMMUNITY): Payer: Medicare Other | Admitting: Certified Registered"

## 2017-08-21 ENCOUNTER — Encounter (HOSPITAL_COMMUNITY): Payer: Self-pay | Admitting: Certified Registered"

## 2017-08-21 ENCOUNTER — Encounter (HOSPITAL_COMMUNITY)
Admission: EM | Disposition: A | Discharge status: Skilled Nursing Facility | Payer: Self-pay | Source: Home / Self Care | Attending: Family Medicine

## 2017-08-21 ENCOUNTER — Inpatient Hospital Stay (HOSPITAL_COMMUNITY): Payer: Medicare Other

## 2017-08-21 ENCOUNTER — Other Ambulatory Visit: Payer: Self-pay

## 2017-08-21 HISTORY — PX: CYSTOSCOPY WITH STENT PLACEMENT: SHX5790

## 2017-08-21 LAB — BASIC METABOLIC PANEL
Anion gap: 11 (ref 5–15)
BUN: <5 mg/dL — ABNORMAL LOW (ref 6–20)
CO2: 23 mmol/L (ref 22–32)
Calcium: 8.2 mg/dL — ABNORMAL LOW (ref 8.9–10.3)
Chloride: 94 mmol/L — ABNORMAL LOW (ref 101–111)
Creatinine, Ser: 0.64 mg/dL (ref 0.44–1.00)
GFR calc Af Amer: >60 mL/min (ref >60)
GFR calc non Af Amer: >60 mL/min (ref >60)
Glucose, Bld: 167 mg/dL — ABNORMAL HIGH (ref 65–99)
Potassium: 3.3 mmol/L — ABNORMAL LOW (ref 3.5–5.1)
Sodium: 128 mmol/L — ABNORMAL LOW (ref 135–145)

## 2017-08-21 LAB — CBC WITH DIFFERENTIAL/PLATELET
Basophils Absolute: 0 10*3/uL (ref 0.0–0.1)
Basophils Relative: 0 %
Eosinophils Absolute: 0.1 10*3/uL (ref 0.0–0.7)
Eosinophils Relative: 1 %
HCT: 26.1 % — ABNORMAL LOW (ref 36.0–46.0)
Hemoglobin: 8.5 g/dL — ABNORMAL LOW (ref 12.0–15.0)
Lymphocytes Relative: 5 %
Lymphs Abs: 0.6 10*3/uL — ABNORMAL LOW (ref 0.7–4.0)
MCH: 26.9 pg (ref 26.0–34.0)
MCHC: 32.6 g/dL (ref 30.0–36.0)
MCV: 82.6 fL (ref 78.0–100.0)
Monocytes Absolute: 0.7 10*3/uL (ref 0.1–1.0)
Monocytes Relative: 6 %
Neutro Abs: 10.6 10*3/uL — ABNORMAL HIGH (ref 1.7–7.7)
Neutrophils Relative %: 88 %
Platelets: 340 10*3/uL (ref 150–400)
RBC: 3.16 MIL/uL — ABNORMAL LOW (ref 3.87–5.11)
RDW: 14.5 % (ref 11.5–15.5)
WBC: 12 10*3/uL — ABNORMAL HIGH (ref 4.0–10.5)

## 2017-08-21 LAB — MAGNESIUM: Magnesium: 1.5 mg/dL — ABNORMAL LOW (ref 1.7–2.4)

## 2017-08-21 LAB — PREPARE RBC (CROSSMATCH)

## 2017-08-21 SURGERY — CREATION, COLOSTOMY, LOOP, LAPAROSCOPIC
Anesthesia: General | Site: Abdomen

## 2017-08-21 MED ORDER — ONDANSETRON HCL 4 MG/2ML IJ SOLN
INTRAMUSCULAR | Status: AC
  Filled 2017-08-21: qty 2

## 2017-08-21 MED ORDER — HYDROMORPHONE HCL 1 MG/ML IJ SOLN
0.2500 mg | INTRAMUSCULAR | Status: DC | PRN
  Administered 2017-08-21 (×2): 0.5 mg via INTRAVENOUS

## 2017-08-21 MED ORDER — BUPIVACAINE LIPOSOME 1.3 % IJ SUSP
20.0000 mL | Freq: Once | INTRAMUSCULAR | Status: DC
  Filled 2017-08-21: qty 20

## 2017-08-21 MED ORDER — METOPROLOL TARTRATE 5 MG/5ML IV SOLN
5.0000 mg | Freq: Four times a day (QID) | INTRAVENOUS | Status: DC
  Administered 2017-08-21 – 2017-08-23 (×7): 5 mg via INTRAVENOUS
  Filled 2017-08-21 (×7): qty 5

## 2017-08-21 MED ORDER — MAGNESIUM SULFATE IN D5W 1-5 GM/100ML-% IV SOLN
1.0000 g | Freq: Once | INTRAVENOUS | Status: AC
  Administered 2017-08-21: 1 g via INTRAVENOUS
  Filled 2017-08-21 (×2): qty 100

## 2017-08-21 MED ORDER — PROPOFOL 10 MG/ML IV BOLUS
INTRAVENOUS | Status: AC
  Filled 2017-08-21: qty 20

## 2017-08-21 MED ORDER — BUPIVACAINE-EPINEPHRINE (PF) 0.5% -1:200000 IJ SOLN
INTRAMUSCULAR | Status: AC
  Filled 2017-08-21: qty 30

## 2017-08-21 MED ORDER — FENTANYL CITRATE (PF) 250 MCG/5ML IJ SOLN
INTRAMUSCULAR | Status: DC | PRN
  Administered 2017-08-21: 25 ug via INTRAVENOUS
  Administered 2017-08-21 (×2): 50 ug via INTRAVENOUS
  Administered 2017-08-21: 25 ug via INTRAVENOUS
  Administered 2017-08-21: 50 ug via INTRAVENOUS
  Administered 2017-08-21 (×2): 25 ug via INTRAVENOUS

## 2017-08-21 MED ORDER — BUPIVACAINE-EPINEPHRINE 0.5% -1:200000 IJ SOLN
INTRAMUSCULAR | Status: DC | PRN
  Administered 2017-08-21: 15 mL

## 2017-08-21 MED ORDER — SUGAMMADEX SODIUM 200 MG/2ML IV SOLN
INTRAVENOUS | Status: DC | PRN
  Administered 2017-08-21: 200 mg via INTRAVENOUS

## 2017-08-21 MED ORDER — IOHEXOL 300 MG/ML  SOLN
INTRAMUSCULAR | Status: DC | PRN
  Administered 2017-08-21: 5 mL via URETHRAL

## 2017-08-21 MED ORDER — LACTATED RINGERS IV SOLN
INTRAVENOUS | Status: DC
  Administered 2017-08-21 – 2017-08-25 (×9): via INTRAVENOUS

## 2017-08-21 MED ORDER — PIPERACILLIN-TAZOBACTAM 3.375 G IVPB
INTRAVENOUS | Status: AC
  Filled 2017-08-21: qty 50

## 2017-08-21 MED ORDER — 0.9 % SODIUM CHLORIDE (POUR BTL) OPTIME
TOPICAL | Status: DC | PRN
  Administered 2017-08-21 (×2): 1000 mL

## 2017-08-21 MED ORDER — DEXAMETHASONE SODIUM PHOSPHATE 10 MG/ML IJ SOLN
INTRAMUSCULAR | Status: DC | PRN
  Administered 2017-08-21: 5 mg via INTRAVENOUS

## 2017-08-21 MED ORDER — LACTATED RINGERS IV SOLN
INTRAVENOUS | Status: DC | PRN
  Administered 2017-08-21: 08:00:00 via INTRAVENOUS

## 2017-08-21 MED ORDER — FENTANYL CITRATE (PF) 100 MCG/2ML IJ SOLN
INTRAMUSCULAR | Status: AC
  Filled 2017-08-21: qty 2

## 2017-08-21 MED ORDER — SODIUM CHLORIDE 0.9 % IR SOLN
Status: DC | PRN
  Administered 2017-08-21: 3000 mL via INTRAVESICAL

## 2017-08-21 MED ORDER — POTASSIUM CHLORIDE 10 MEQ/100ML IV SOLN
10.0000 meq | INTRAVENOUS | Status: AC
  Administered 2017-08-21 (×2): 10 meq via INTRAVENOUS
  Filled 2017-08-21 (×2): qty 100

## 2017-08-21 MED ORDER — ONDANSETRON HCL 4 MG/2ML IJ SOLN
INTRAMUSCULAR | Status: DC | PRN
  Administered 2017-08-21: 4 mg via INTRAVENOUS

## 2017-08-21 MED ORDER — FENTANYL CITRATE (PF) 250 MCG/5ML IJ SOLN
INTRAMUSCULAR | Status: AC
  Filled 2017-08-21: qty 5

## 2017-08-21 MED ORDER — LACTATED RINGERS IR SOLN
Status: DC | PRN
  Administered 2017-08-21: 1000 mL

## 2017-08-21 MED ORDER — ROCURONIUM BROMIDE 10 MG/ML (PF) SYRINGE
PREFILLED_SYRINGE | INTRAVENOUS | Status: DC | PRN
  Administered 2017-08-21: 50 mg via INTRAVENOUS

## 2017-08-21 MED ORDER — PHENYLEPHRINE HCL 10 MG/ML IJ SOLN
INTRAVENOUS | Status: DC | PRN
  Administered 2017-08-21: 25 ug/min via INTRAVENOUS

## 2017-08-21 MED ORDER — MIDAZOLAM HCL 2 MG/2ML IJ SOLN
INTRAMUSCULAR | Status: DC | PRN
  Administered 2017-08-21 (×2): 1 mg via INTRAVENOUS

## 2017-08-21 MED ORDER — HYDROMORPHONE HCL 1 MG/ML IJ SOLN
INTRAMUSCULAR | Status: AC
  Filled 2017-08-21: qty 1

## 2017-08-21 MED ORDER — PROPOFOL 10 MG/ML IV BOLUS
INTRAVENOUS | Status: DC | PRN
  Administered 2017-08-21: 100 mg via INTRAVENOUS

## 2017-08-21 MED ORDER — SODIUM CHLORIDE 0.9 % IV SOLN
Freq: Once | INTRAVENOUS | Status: AC
  Administered 2017-08-21: 08:00:00 via INTRAVENOUS

## 2017-08-21 MED ORDER — MIDAZOLAM HCL 2 MG/2ML IJ SOLN
INTRAMUSCULAR | Status: AC
  Filled 2017-08-21: qty 2

## 2017-08-21 MED ORDER — SUCCINYLCHOLINE CHLORIDE 200 MG/10ML IV SOSY
PREFILLED_SYRINGE | INTRAVENOUS | Status: DC | PRN
  Administered 2017-08-21: 100 mg via INTRAVENOUS

## 2017-08-21 SURGICAL SUPPLY — 74 items
ADH SKN CLS APL DERMABOND .7 (GAUZE/BANDAGES/DRESSINGS) ×2
APPLIER CLIP ROT 10 11.4 M/L (STAPLE)
APR CLP MED LRG 11.4X10 (STAPLE)
BAG URO CATCHER STRL LF (MISCELLANEOUS) ×3 IMPLANT
BLADE EXTENDED COATED 6.5IN (ELECTRODE) IMPLANT
CABLE HIGH FREQUENCY MONO STRZ (ELECTRODE) ×1 IMPLANT
CATH URET 5FR 28IN OPEN ENDED (CATHETERS) ×3 IMPLANT
CELLS DAT CNTRL 66122 CELL SVR (MISCELLANEOUS) IMPLANT
CLIP APPLIE ROT 10 11.4 M/L (STAPLE) IMPLANT
CLOTH BEACON ORANGE TIMEOUT ST (SAFETY) ×3 IMPLANT
COVER FOOTSWITCH UNIV (MISCELLANEOUS) IMPLANT
COVER SURGICAL LIGHT HANDLE (MISCELLANEOUS) ×3 IMPLANT
DECANTER SPIKE VIAL GLASS SM (MISCELLANEOUS) ×2 IMPLANT
DERMABOND ADVANCED (GAUZE/BANDAGES/DRESSINGS) ×1
DERMABOND ADVANCED .7 DNX12 (GAUZE/BANDAGES/DRESSINGS) IMPLANT
DRAIN CHANNEL 19F RND (DRAIN) IMPLANT
DRAPE LAPAROSCOPIC ABDOMINAL (DRAPES) ×3 IMPLANT
DRAPE UTILITY XL STRL (DRAPES) ×3 IMPLANT
DRSG OPSITE POSTOP 4X10 (GAUZE/BANDAGES/DRESSINGS) IMPLANT
DRSG OPSITE POSTOP 4X6 (GAUZE/BANDAGES/DRESSINGS) IMPLANT
DRSG OPSITE POSTOP 4X8 (GAUZE/BANDAGES/DRESSINGS) IMPLANT
ELECT PENCIL ROCKER SW 15FT (MISCELLANEOUS) ×6 IMPLANT
ELECT REM PT RETURN 15FT ADLT (MISCELLANEOUS) ×3 IMPLANT
GAUZE SPONGE 4X4 12PLY STRL (GAUZE/BANDAGES/DRESSINGS) IMPLANT
GLOVE BIOGEL M STRL SZ7.5 (GLOVE) ×3 IMPLANT
GLOVE BIOGEL PI IND STRL 7.5 (GLOVE) ×4 IMPLANT
GLOVE BIOGEL PI INDICATOR 7.5 (GLOVE) ×2
GLOVE ECLIPSE 7.5 STRL STRAW (GLOVE) ×6 IMPLANT
GOWN STRL REUS W/TWL LRG LVL3 (GOWN DISPOSABLE) ×6 IMPLANT
GOWN STRL REUS W/TWL XL LVL3 (GOWN DISPOSABLE) ×12 IMPLANT
GUIDEWIRE STR DUAL SENSOR (WIRE) ×3 IMPLANT
LEGGING LITHOTOMY PAIR STRL (DRAPES) ×3 IMPLANT
MANIFOLD NEPTUNE II (INSTRUMENTS) ×3 IMPLANT
PACK COLON (CUSTOM PROCEDURE TRAY) ×3 IMPLANT
PACK CYSTO (CUSTOM PROCEDURE TRAY) ×3 IMPLANT
PACK GENERAL/GYN (CUSTOM PROCEDURE TRAY) ×2 IMPLANT
PAD POSITIONING PINK XL (MISCELLANEOUS) ×3 IMPLANT
PORT LAP GEL ALEXIS MED 5-9CM (MISCELLANEOUS) IMPLANT
RELOAD BLUE CHELON 45 (STAPLE) IMPLANT
RETRACTOR WND ALEXIS 18 MED (MISCELLANEOUS) IMPLANT
RTRCTR WOUND ALEXIS 18CM MED (MISCELLANEOUS)
SCISSORS LAP 5X35 DISP (ENDOMECHANICALS) ×3 IMPLANT
SEALER TISSUE X1 CVD JAW (INSTRUMENTS) IMPLANT
SET IRRIG TUBING LAPAROSCOPIC (IRRIGATION / IRRIGATOR) ×3 IMPLANT
SHEARS HARMONIC ACE PLUS 36CM (ENDOMECHANICALS) IMPLANT
SLEEVE ADV FIXATION 5X100MM (TROCAR) ×7 IMPLANT
SOLUTION ANTI FOG 6CC (MISCELLANEOUS) ×3 IMPLANT
STAPLER VISISTAT 35W (STAPLE) ×2 IMPLANT
STENT URET 6FRX24 CONTOUR (STENTS) ×1 IMPLANT
SUT ETHILON 3 0 PS 1 (SUTURE) ×1 IMPLANT
SUT MNCRL AB 4-0 PS2 18 (SUTURE) ×1 IMPLANT
SUT PDS AB 0 CT1 36 (SUTURE) IMPLANT
SUT PDS AB 1 CTX 36 (SUTURE) IMPLANT
SUT PDS AB 1 TP1 96 (SUTURE) IMPLANT
SUT PROLENE 0 SH 30 (SUTURE) ×1 IMPLANT
SUT PROLENE 2 0 KS (SUTURE) IMPLANT
SUT PROLENE 3 0 SH 48 (SUTURE) IMPLANT
SUT SILK 2 0 (SUTURE) ×3
SUT SILK 2 0 SH CR/8 (SUTURE) ×3 IMPLANT
SUT SILK 2-0 18XBRD TIE 12 (SUTURE) ×2 IMPLANT
SUT SILK 3 0 (SUTURE) ×3
SUT SILK 3 0 SH CR/8 (SUTURE) ×3 IMPLANT
SUT SILK 3-0 18XBRD TIE 12 (SUTURE) ×2 IMPLANT
SUT VIC AB 3-0 SH 18 (SUTURE) ×1 IMPLANT
SYS LAPSCP GELPORT 120MM (MISCELLANEOUS)
SYSTEM LAPSCP GELPORT 120MM (MISCELLANEOUS) IMPLANT
TOWEL OR NON WOVEN STRL DISP B (DISPOSABLE) ×5 IMPLANT
TRAY FOLEY W/METER SILVER 16FR (SET/KITS/TRAYS/PACK) ×3 IMPLANT
TROCAR ADV FIXATION 12X100MM (TROCAR) IMPLANT
TROCAR ADV FIXATION 5X100MM (TROCAR) ×2 IMPLANT
TROCAR BLADELESS OPT 5 100 (ENDOMECHANICALS) ×3 IMPLANT
TROCAR XCEL BLUNT TIP 100MML (ENDOMECHANICALS) ×2 IMPLANT
TUBING CONNECTING 10 (TUBING) ×3 IMPLANT
TUBING INSUF HEATED (TUBING) ×3 IMPLANT

## 2017-08-21 NOTE — Anesthesia Procedure Notes (Signed)
Arterial Line Insertion Start/End3/24/2019 7:55 AM Performed by: Cynda Familia, CRNA, CRNA  Patient location: Pre-op. Preanesthetic checklist: patient identified, IV checked, site marked, risks and benefits discussed, surgical consent, monitors and equipment checked, pre-op evaluation, timeout performed and anesthesia consent Lidocaine 1% used for infiltration and patient sedated Left, radial was placed Catheter size: 20 Fr Hand hygiene performed  and maximum sterile barriers used  Allen's test indicative of satisfactory collateral circulation Attempts: 1 Procedure performed without using ultrasound guided technique. Following insertion, dressing applied and Biopatch. Post procedure assessment: normal and unchanged  Post procedure complications: local hematoma. Patient tolerated the procedure well with no immediate complications. Additional procedure comments: Supervised and assisted by [facility].

## 2017-08-21 NOTE — Progress Notes (Signed)
Pt transferred to PACU.  Family at bedside.

## 2017-08-21 NOTE — Op Note (Signed)
Preoperative Diagnosis: Obstructing rectosigmoid colonic mass  Postoprative Diagnosis: Same  Procedure: Procedure(s): LAPAROSCOPIC DIVERTING LOOP TRANSVERSE COLOSTOMY    Surgeon: Excell Seltzer T   Assistants: None  Anesthesia:  General endotracheal anesthesia  Indications: Patient is a 77 year old female with significant comorbidities including cardiac disease who presents with obstruction at her distal sigmoid colon with a large mass and apparent invasion of the left pelvic sidewall and ureteral obstruction.  There was an apparent small perforation with a tiny amount of air outside the colon.  She has been stable on antibiotics.  After discussion with the patient and family we have elected to proceed with diverting laparoscopic transverse loop colostomy to relieve her obstruction.  I discussed the procedure and indications and its nature and risks with the patient and family and they agree to proceed.    Procedure Detail: Following completion of cystoscopy and left ureteral stent by urology the entire anterior abdomen was widely sterilely prepped and draped.  The patient was already on broad-spectrum IV antibiotics.  Patient timeout was performed and correct procedure verified.  Access was obtained with a 5 mm Optiview trocar in the left upper quadrant through the patient's marked ostomy position without difficulty and pneumoperitoneum established.  There was noted to be significant distention of the transverse and left colon and some degree of the small bowel.  Under direct vision 3 5 mm trochars were placed along the right lower quadrant and lower midline.  Examination of the peritoneal cavity revealed no evidence of peritonitis or any significant amount of free fluid.  The colon was very dilated, particularly approaching the sigmoid colon.  I did not see any purulence or peritonitis in the pelvis or around the left colon.  The exact point of tumor and obstruction could not be visualized  due to distention.  The more proximal sigmoid and left colon was relatively more fixed and I elected to proceed with a diverting loop transverse colostomy.  A mobile portion of the transverse colon was identified laparoscopically after examining carefully the colon and this was grasped through 1 of the lower trochars.  I then excised a skin and subcutaneous button at the ostomy site where the original Optiview trocar was placed and dissection was carried down to the anterior fascia.  This was incised transversely and the rectus muscle bluntly split and the peritoneum incised.  The transverse colon could then be brought out easily as a loop under no tension.  It was sutured at the fascial level with 0 PDS in a bridge placed through the mesentery behind the colon.  The colostomy was then matured opening with Bovie and matured with interrupted 3-0 Vicryl.  All CO2 was evacuated and trochars removed.  The 5 mm trocar sites were closed with Monocryl and Dermabond.  Sponge needle and instrument counts were correct.    Findings: Above  Estimated Blood Loss:  Minimal         Drains: None  Blood Given: none          Specimens: None        Complications:  * No complications entered in OR log *         Disposition: PACU - hemodynamically stable.         Condition: stable

## 2017-08-21 NOTE — Anesthesia Preprocedure Evaluation (Addendum)
Anesthesia Evaluation  Patient identified by MRN, date of birth, ID band Patient awake    Reviewed: Allergy & Precautions, H&P , NPO status , Patient's Chart, lab work & pertinent test results  History of Anesthesia Complications (+) PONV  Airway Mallampati: III  TM Distance: >3 FB Neck ROM: Full    Dental no notable dental hx. (+) Teeth Intact, Dental Advisory Given   Pulmonary asthma ,    Pulmonary exam normal breath sounds clear to auscultation       Cardiovascular hypertension, Pt. on medications + CAD, + Past MI and + Peripheral Vascular Disease  + dysrhythmias Atrial Fibrillation  Rhythm:Regular Rate:Normal + Systolic murmurs    Neuro/Psych negative neurological ROS  negative psych ROS   GI/Hepatic negative GI ROS, Neg liver ROS,   Endo/Other  Hypothyroidism   Renal/GU negative Renal ROS  negative genitourinary   Musculoskeletal  (+) Arthritis , Osteoarthritis,    Abdominal   Peds  Hematology negative hematology ROS (+) anemia ,   Anesthesia Other Findings   Reproductive/Obstetrics negative OB ROS                            Anesthesia Physical Anesthesia Plan  ASA: III  Anesthesia Plan: General   Post-op Pain Management:    Induction: Intravenous, Rapid sequence and Cricoid pressure planned  PONV Risk Score and Plan: 4 or greater and Ondansetron, Dexamethasone and Treatment may vary due to age or medical condition  Airway Management Planned: Oral ETT  Additional Equipment: Arterial line, CVP and Ultrasound Guidance Line Placement  Intra-op Plan:   Post-operative Plan: Extubation in OR and Possible Post-op intubation/ventilation  Informed Consent: I have reviewed the patients History and Physical, chart, labs and discussed the procedure including the risks, benefits and alternatives for the proposed anesthesia with the patient or authorized representative who has  indicated his/her understanding and acceptance.   Dental advisory given  Plan Discussed with: CRNA  Anesthesia Plan Comments:        Anesthesia Quick Evaluation

## 2017-08-21 NOTE — Transfer of Care (Signed)
Immediate Anesthesia Transfer of Care Note  Patient: Shannon Scott  Procedure(s) Performed: LAPAROSCOPIC DIVERTING LOOP COLOSTOMY (N/A Abdomen) CYSTOSCOPY WITH STENT PLACEMENT (Bilateral )  Patient Location: PACU  Anesthesia Type:General  Level of Consciousness: awake and alert   Airway & Oxygen Therapy: Patient Spontanous Breathing and Patient connected to face mask oxygen  Post-op Assessment: Report given to RN and Post -op Vital signs reviewed and stable  Post vital signs: Reviewed and stable  Last Vitals:  Vitals Value Taken Time  BP 175/80 08/21/2017 10:30 AM  Temp    Pulse 79 08/21/2017 10:34 AM  Resp 24 08/21/2017 10:34 AM  SpO2 98 % 08/21/2017 10:34 AM  Vitals shown include unvalidated device data.  Last Pain:  Vitals:   08/21/17 0407  TempSrc: Oral  PainSc:       Patients Stated Pain Goal: 2 (18/84/16 6063)  Complications: No apparent anesthesia complications

## 2017-08-21 NOTE — Anesthesia Procedure Notes (Signed)
Central Venous Catheter Insertion Performed by: Roderic Palau, MD, anesthesiologist Start/End3/24/2019 7:40 AM, 08/21/2017 7:55 AM Patient location: Pre-op. Preanesthetic checklist: patient identified, IV checked, site marked, risks and benefits discussed, surgical consent, monitors and equipment checked, pre-op evaluation, timeout performed and anesthesia consent Position: Trendelenburg Lidocaine 1% used for infiltration and patient sedated Hand hygiene performed , maximum sterile barriers used  and Seldinger technique used Catheter size: 8 Fr Total catheter length 16. Central line was placed.Double lumen Procedure performed using ultrasound guided technique. Ultrasound Notes:anatomy identified, needle tip was noted to be adjacent to the nerve/plexus identified, no ultrasound evidence of intravascular and/or intraneural injection and image(s) printed for medical record Attempts: 1 Following insertion, dressing applied, line sutured and Biopatch. Post procedure assessment: blood return through all ports  Patient tolerated the procedure well with no immediate complications.

## 2017-08-21 NOTE — Anesthesia Postprocedure Evaluation (Signed)
Anesthesia Post Note  Patient: Shannon Scott  Procedure(s) Performed: LAPAROSCOPIC DIVERTING LOOP COLOSTOMY (N/A Abdomen) CYSTOSCOPY WITH STENT PLACEMENT (Bilateral )     Patient location during evaluation: PACU Anesthesia Type: General Level of consciousness: awake and alert Pain management: pain level controlled Vital Signs Assessment: post-procedure vital signs reviewed and stable Respiratory status: spontaneous breathing, nonlabored ventilation, respiratory function stable and patient connected to nasal cannula oxygen Cardiovascular status: blood pressure returned to baseline and stable Postop Assessment: no apparent nausea or vomiting Anesthetic complications: no    Last Vitals:  Vitals:   08/21/17 1100 08/21/17 1115  BP:    Pulse: 70   Resp: 13   Temp:  36.5 C  SpO2: 100%     Last Pain:  Vitals:   08/21/17 1115  TempSrc:   PainSc: 2                  Tawnya Pujol,W. EDMOND

## 2017-08-21 NOTE — Progress Notes (Signed)
Cardiology PA [facility] notified that pt is only on amiodarone drip not cardizem as well.  Pt has maintained NSR with hr in70s.  Ok to continue just on amiodarone drip into surgery.

## 2017-08-21 NOTE — Addendum Note (Signed)
Addendum  created 08/21/17 1156 by Cynda Familia, CRNA   Intraprocedure Event edited, Intraprocedure Flowsheets edited, Intraprocedure Meds edited

## 2017-08-21 NOTE — Progress Notes (Addendum)
PROGRESS NOTE [facility]   PRESLEA RHODUS   XVQ:[id number] DOB: 05-31-1941  DOA: 08/18/2017 PCP: [name], MD   Brief Narrative:  Shannon Scott is a 77 year old female with medical history significant for coronary artery disease status post CABG, endometrial cancer status post total hysterectomy, hypothyroidism who presented to the emergency department complaining of generalized abdominal pain nausea and vomiting.  CT scan of the abdomen and pelvis showed heterogeneous ill defined mass involving the sigmoid colon in the left pelvic with suspicious perforation and abscess formation.  Patient also was found to be on new onset A. fib with RVR.  Patient was admitted with working diagnosis of colon mass complicated with A. fib with RVR.  General surgery and cardiology has been consulted.  On CT was noted that the mass was compressing left ureter causing dilation therefore urology was consulted for possible stent placement.  Subjective: Patient seen and examined, she is a status post laparoscopic diverting loop transverse colostomy and left retrograde pyelogram with left JJ stent placement.  Patient report pain is well controlled. No complaints.  Assessment & Plan: Colon mass Likely malignant S/p laparoscopic diverting loop transverse colostomy Patient with history of endometrial cancer 2 years ago with total hysterectomy. Continue management per surgical team  Bowel perforation/abscess CT scan report focal gas and fluid collection consistent with bowel perforation. Patient currently on Zosyn we will continue antibiotic, likely will need total of 10-14 days of antibiotic Blood cultures negative so far.  Remains afebrile.  Continue supportive treatment  Ureteral dilation/mild hydronephrosis Status post cystoscopy, left retrograde pyelogram with left JJ stent placement Continue management per urology  A. fib with RVR New onset, likely from metabolic derangement and  extensive CAD history Patient was treated with Cardizem drip, this was stopped now only on amiodarone.  We will be adding metoprolol for blood pressure control which will help with rate control as well. Continue to hold anti-correlation for now, will defer to cardiology and surgical team on when to start.. Echocardiogram LVEF 65-70%, mild LVH pattern, no regional wall abnormality. Continue to monitor  Hypertension BP continues to be elevated due to pain and not getting her p.o. medications Patient with hydralazine as needed.  Will add metoprolol every 6 hours Continue to monitor BP  Hypokalemia Replete Keep K above 4 Check BMP and magnesium in the morning  Anemia likely of chronic disease Multifactorial, hemodilution, colonic mass and  postop No  signs of overt bleeding, patient is asymptomatic. Transfuse if hemoglobin less than 8 given her risk factors  DVT prophylaxis: SCDs Code Status: Full code Family Communication: Daughter at bedside Disposition Plan: Home when recovered from surgery  Consultants:   General surgery  Urology  Cardiology  Procedures:   Antimicrobials:  Zosyn 3/19 ->   Objective: Vitals:   08/21/17 1300 08/21/17 1330 08/21/17 1400 08/21/17 1500  BP:      Pulse: 74 90 94 82  Resp: 14 19 (!) 22 18  Temp:      TempSrc:      SpO2: 97% 96% 95% 95%    Intake/Output Summary (Last 24 hours) at 08/21/2017 1547 Last data filed at 08/21/2017 1522 Gross per 24 hour  Intake 4073.53 ml  Output 1265 ml  Net 2808.53 ml   There were no vitals filed for this visit.  Examination:  General: Pt is alert, awake, not in acute distress Cardiovascular: RRR, S1/S2 +, no rubs, no gallops Respiratory: CTA bilaterally, no wheezing, no rhonchi Abdominal: Abdomen soft, colostomy  bag in place, mild tender to palpation Extremities: no edema  Data Reviewed: I have personally reviewed following labs and imaging studies  CBC: Recent Labs  Lab 08/18/17 1142  08/20/17 0435 08/21/17 0322  WBC 9.0 11.4* 12.0*  NEUTROABS  --  10.0* 10.6*  HGB 10.7* 9.1* 8.5*  HCT 32.4* 28.6* 26.1*  MCV 81.0 83.4 82.6  PLT 515* 384 270   Basic Metabolic Panel: Recent Labs  Lab 08/18/17 1255 08/18/17 2232 08/19/17 0337 08/19/17 1655 08/20/17 0435 08/21/17 0322  NA  --  129* 130* 132* 131* 128*  K  --  2.7* 2.6* 3.2* 3.3* 3.3*  CL  --  91* 93* 100* 98* 94*  CO2  --  27 26 23 23 23   GLUCOSE  --  126* 112* 91 80 167*  BUN  --  5* 5* <5* <5* <5*  CREATININE  --  0.65 0.68 0.58 0.63 0.64  CALCIUM  --  8.4* 8.3* 7.4* 8.2* 8.2*  MG 1.8  --  2.4 1.8 1.9 1.5*  PHOS  --   --   --  1.9*  --   --    GFR: CrCl cannot be calculated (Unknown ideal weight.). Liver Function Tests: Recent Labs  Lab 08/18/17 1142 08/19/17 0337  AST 28 23  ALT 12* 11*  ALKPHOS 100 79  BILITOT 0.4 0.6  PROT 7.9 6.1*  ALBUMIN 3.8 2.9*   Recent Labs  Lab 08/18/17 1142  LIPASE 21   No results for input(s): AMMONIA in the last 168 hours. Coagulation Profile: No results for input(s): INR, PROTIME in the last 168 hours. Cardiac Enzymes: Recent Labs  Lab 08/19/17 1241 08/19/17 1655 08/20/17 0015 08/20/17 0435  TROPONINI 0.77* 0.56* 0.45* 0.28*   BNP (last 3 results) No results for input(s): PROBNP in the last 8760 hours. HbA1C: No results for input(s): HGBA1C in the last 72 hours. CBG: No results for input(s): GLUCAP in the last 168 hours. Lipid Profile: No results for input(s): CHOL, HDL, LDLCALC, TRIG, CHOLHDL, LDLDIRECT in the last 72 hours. Thyroid Function Tests: No results for input(s): TSH, T4TOTAL, FREET4, T3FREE, THYROIDAB in the last 72 hours. Anemia Panel: No results for input(s): VITAMINB12, FOLATE, FERRITIN, TIBC, IRON, RETICCTPCT in the last 72 hours. Sepsis Labs: Recent Labs  Lab 08/18/17 1317 08/18/17 1652  LATICACIDVEN 0.97 0.97    Recent Results (from the past 240 hour(s))  MRSA PCR Screening     Status: None   Collection Time: 08/18/17   7:43 PM  Result Value Ref Range Status   MRSA by PCR NEGATIVE NEGATIVE Final    Comment:        The GeneXpert MRSA Assay (FDA approved for NASAL specimens only), is one component of a comprehensive MRSA colonization surveillance program. It is not intended to diagnose MRSA infection nor to guide or monitor treatment for MRSA infections. Performed at [facility], [place] [address] [place], [place] [address]   Culture, blood (routine x 2)     Status: None (Preliminary result)   Collection Time: 08/18/17  7:54 PM  Result Value Ref Range Status   Specimen Description   Final    BLOOD RIGHT ANTECUBITAL Performed at [facility] [address] [place], [place] [address]    Special Requests   Final    BOTTLES DRAWN AEROBIC AND ANAEROBIC Blood Culture adequate volume Performed at [facility] [address] [place], [place] [address]    Culture   Final  NO GROWTH 3 DAYS Performed at [facility], [place] [address] [place], [place] [address]    Report Status PENDING  Incomplete  Culture, blood (routine x 2)     Status: None (Preliminary result)   Collection Time: 08/18/17  7:54 PM  Result Value Ref Range Status   Specimen Description   Final    BLOOD RIGHT FOREARM Performed at [facility] [address] [place], [place] [address]    Special Requests   Final    BOTTLES DRAWN AEROBIC ONLY Blood Culture results may not be optimal due to an inadequate volume of blood received in culture bottles Performed at [facility] [address], [place], [place] [address]    Culture   Final    NO GROWTH 3 DAYS Performed at [facility], [place] [address], [place], [place] [address]    Report Status PENDING  Incomplete     Radiology Studies: Dg C-arm 1-60 Min-no Report  Result Date: 08/21/2017 Fluoroscopy was utilized by the requesting physician.  No radiographic interpretation.      Scheduled Meds: . Chlorhexidine Gluconate Cloth  6 each Topical Daily  . diclofenac sodium  2 g Topical QID  . diltiazem  10 mg Intravenous Once  . HYDROmorphone      . levothyroxine  25 mcg Intravenous Daily  . ondansetron      . pantoprazole (PROTONIX) IV  40 mg Intravenous Q24H  . sodium chloride flush  10-40 mL Intracatheter Q12H   Continuous Infusions: . amiodarone 30 mg/hr (08/21/17 1153)  . diltiazem (CARDIZEM) infusion    . lactated ringers 75 mL/hr at 08/21/17 1056  . piperacillin-tazobactam (ZOSYN)  IV 3.375 g (08/21/17 1522)     LOS: 3 days    Time spent: Total of 25 minutes spent with pt, greater than 50% of which was spent in discussion of  treatment, counseling and coordination of care  Chipper Oman, MD Pager: Text Page via [email or web address]   If 7PM-7AM, please contact night-coverage [email or web address] 08/21/2017, 3:47 PM   Note - This record has been created using [employer]. Chart creation errors have been sought, but may not always have been located. Such creation errors do not reflect on the standard of medical care.

## 2017-08-21 NOTE — Progress Notes (Signed)
Dr Alcario Drought aware of labs.  Orders received.

## 2017-08-21 NOTE — Progress Notes (Signed)
Bodenheimer PA notified of pts Mag, Potassium levels.

## 2017-08-21 NOTE — Anesthesia Procedure Notes (Signed)
Date/Time: 08/21/2017 10:20 AM Performed by: Cynda Familia, CRNA Oxygen Delivery Method: Simple face mask Placement Confirmation: positive ETCO2 and breath sounds checked- equal and bilateral Dental Injury: Teeth and Oropharynx as per pre-operative assessment

## 2017-08-21 NOTE — Op Note (Signed)
Operative Note  Preoperative diagnosis:  1.  Left ureteral obstruction secondary to extrinsic compression from colonic mass  Postoperative diagnosis: 1.  Left ureteral obstruction secondary to extrinsic compression from colonic mass  Procedure(s): 1.  Cystoscopy 2.  Left retrograde pyelogram with intraoperative interpretation of fluoroscopic imaging 3.  Left JJ stent placement  Surgeon: Ellison Hughs, MD  Assistants: None  Anesthesia: General endotracheal  Complications: None  EBL: Less than 5 mL  Specimens: 1.  None  Drains/Catheters: 1.  Left 6 French by 26 cm JJ stent without tether 2.  16 French Foley catheter  Intraoperative findings:   1.  Solitary left collecting system with uniform dilation of the left ureter, renal pelvis and its associated calyces 2.  Left JJ stent in good position on fluoroscopy  Indication:  Shannon Scott is a 77 y.o. female with multiple medical comorbidities and a colonic mass causing extrinsic compression of the left distal ureter resulting in left-sided hydronephrosis and generalized abdominal pain.  She is undergoing a laparoscopic diverting colostomy with Dr. Excell Seltzer today in hopes of alleviating her abdominal pain.  Her colonic mass will be addressed at a later date.  Description of procedure: After informed consent was obtained, the patient was brought to the operating room and general LMA anesthesia was administered. The patient was then placed in the dorsolithotomy position and prepped and draped in usual sterile fashion. A timeout was performed. A 23 French rigid cystoscope was then inserted into the urethral meatus and advanced into the bladder under direct vision. A complete bladder survey revealed no intravesical pathology.  A 5 French open-ended catheter was then inserted into the left ureteral orifice and a left retrograde pyelogram was obtained with the findings listed above.  A sensor wire was then advanced through the  lumen of the ureteral catheter up to the left renal pelvis, under fluoroscopic guidance.  A 6 [place] JJ stent was then placed over the wire and into good position within the left collecting system, confirming placement via fluoroscopy.  The rigid cystoscope was removed and a 75 French Foley catheter was placed and set to gravity drainage.  The patient tolerated the procedure well and Dr. Excell Seltzer proceeded with his portion of the case.  Plan: The patient will likely need to have her left JJ stent in long-term if she undergoes treatment of her colonic mass either via chemotherapy, radiation and/or surgery in the future

## 2017-08-21 NOTE — Interval H&P Note (Signed)
History and Physical Interval Note:  08/21/2017 7:26 AM  Shannon Scott  has presented today for surgery, with the diagnosis of obstructing colon mass  The various methods of treatment have been discussed with the patient and family. After consideration of risks, benefits and other options for treatment, the patient has consented to  Procedure(s): LAPAROSCOPIC LOOP COLOSTOMY (N/A)  as a surgical intervention .  The patient's history has been reviewed, patient examined, no change in status, stable for surgery.  I have reviewed the patient's chart and labs.  Questions were answered to the patient's satisfaction.  I discussed the nature of surgery and indications with the patient's family and their questions were answered as well.   Darene Lamer Porcha Deblanc

## 2017-08-21 NOTE — Anesthesia Procedure Notes (Signed)
Procedure Name: Intubation Date/Time: 08/21/2017 8:24 AM Performed by: Cynda Familia, CRNA Pre-anesthesia Checklist: Patient identified, Emergency Drugs available, Suction available and Patient being monitored Patient Re-evaluated:Patient Re-evaluated prior to induction Oxygen Delivery Method: Circle System Utilized Preoxygenation: Pre-oxygenation with 100% oxygen Induction Type: IV induction Ventilation: Mask ventilation without difficulty Grade View: Grade I Tube type: Subglottic suction tube Tube size: 7.0 mm Number of attempts: 1 Airway Equipment and Method: Stylet and Video-laryngoscopy Placement Confirmation: ETT inserted through vocal cords under direct vision,  positive ETCO2 and breath sounds checked- equal and bilateral Secured at: 21 cm Tube secured with: Tape Dental Injury: Teeth and Oropharynx as per pre-operative assessment  Comments: Smooth IV induction Fitzgerald--- intubation AM CRNA traumatic-- front teeth chipped prior to laryngoscopy-- unchanged after intibation--- bilat BS Ola Spurr

## 2017-08-21 NOTE — Progress Notes (Signed)
Pre-Op Nursing Note: Time-Out performed prior to Ctl Line Placement by MDA in Pre-Op area. Correct patient, procedure, all verified and confirmed by MDA and this RN.

## 2017-08-22 ENCOUNTER — Encounter (HOSPITAL_COMMUNITY): Payer: Self-pay | Admitting: General Surgery

## 2017-08-22 DIAGNOSIS — I482 Chronic atrial fibrillation, unspecified: Secondary | ICD-10-CM

## 2017-08-22 DIAGNOSIS — I4891 Unspecified atrial fibrillation: Secondary | ICD-10-CM

## 2017-08-22 LAB — CBC WITH DIFFERENTIAL/PLATELET
Basophils Absolute: 0 10*3/uL (ref 0.0–0.1)
Basophils Relative: 0 %
Eosinophils Absolute: 0.2 10*3/uL (ref 0.0–0.7)
Eosinophils Relative: 2 %
HCT: 35.4 % — ABNORMAL LOW (ref 36.0–46.0)
Hemoglobin: 11.5 g/dL — ABNORMAL LOW (ref 12.0–15.0)
Lymphocytes Relative: 6 %
Lymphs Abs: 0.8 10*3/uL (ref 0.7–4.0)
MCH: 27.1 pg (ref 26.0–34.0)
MCHC: 32.5 g/dL (ref 30.0–36.0)
MCV: 83.3 fL (ref 78.0–100.0)
Monocytes Absolute: 0.6 10*3/uL (ref 0.1–1.0)
Monocytes Relative: 5 %
Neutro Abs: 10.5 10*3/uL — ABNORMAL HIGH (ref 1.7–7.7)
Neutrophils Relative %: 87 %
Platelets: 358 10*3/uL (ref 150–400)
RBC: 4.25 MIL/uL (ref 3.87–5.11)
RDW: 15.8 % — ABNORMAL HIGH (ref 11.5–15.5)
WBC: 12.2 10*3/uL — ABNORMAL HIGH (ref 4.0–10.5)

## 2017-08-22 LAB — BASIC METABOLIC PANEL
Anion gap: 10 (ref 5–15)
BUN: 6 mg/dL (ref 6–20)
CO2: 24 mmol/L (ref 22–32)
Calcium: 6.2 mg/dL — CL (ref 8.9–10.3)
Chloride: 99 mmol/L — ABNORMAL LOW (ref 101–111)
Creatinine, Ser: 0.62 mg/dL (ref 0.44–1.00)
GFR calc Af Amer: >60 mL/min (ref >60)
GFR calc non Af Amer: >60 mL/min (ref >60)
Glucose, Bld: 98 mg/dL (ref 65–99)
Potassium: 4.8 mmol/L (ref 3.5–5.1)
Sodium: 133 mmol/L — ABNORMAL LOW (ref 135–145)

## 2017-08-22 LAB — POCT I-STAT 7, (LYTES, BLD GAS, ICA,H+H)
Acid-base deficit: 2 mmol/L (ref 0.0–2.0)
Bicarbonate: 23.4 mmol/L (ref 20.0–28.0)
Calcium, Ion: 1.14 mmol/L — ABNORMAL LOW (ref 1.15–1.40)
HCT: 31 % — ABNORMAL LOW (ref 36.0–46.0)
Hemoglobin: 10.5 g/dL — ABNORMAL LOW (ref 12.0–15.0)
O2 Saturation: 100 %
Patient temperature: 36.2
Potassium: 3.5 mmol/L (ref 3.5–5.1)
Sodium: 130 mmol/L — ABNORMAL LOW (ref 135–145)
TCO2: 25 mmol/L (ref 22–32)
pCO2 arterial: 38 mmHg (ref 32.0–48.0)
pH, Arterial: 7.393 (ref 7.350–7.450)
pO2, Arterial: 435 mmHg — ABNORMAL HIGH (ref 83.0–108.0)

## 2017-08-22 LAB — MAGNESIUM: Magnesium: 1.2 mg/dL — ABNORMAL LOW (ref 1.7–2.4)

## 2017-08-22 MED ORDER — MAGNESIUM SULFATE 4 GM/100ML IV SOLN
4.0000 g | Freq: Once | INTRAVENOUS | Status: AC
  Administered 2017-08-22: 4 g via INTRAVENOUS
  Filled 2017-08-22: qty 100

## 2017-08-22 MED ORDER — DILTIAZEM HCL ER COATED BEADS 120 MG PO CP24
120.0000 mg | ORAL_CAPSULE | Freq: Every day | ORAL | Status: DC
  Administered 2017-08-22 – 2017-08-23 (×2): 120 mg via ORAL
  Filled 2017-08-22 (×2): qty 1

## 2017-08-22 MED FILL — Piperacillin Sod-Tazobactam Sod in Dex IV Sol 3-0.375GM/50ML: INTRAVENOUS | Qty: 50 | Status: AC

## 2017-08-22 NOTE — Progress Notes (Addendum)
Progress Note  Patient Name: Shannon Scott Date of Encounter: 08/22/2017  Primary Cardiologist: Pixie Casino, MD   Subjective   With some abdominal soreness today. Denies palpitations, chest pain, or SOB.   Inpatient Medications    Scheduled Meds: . Chlorhexidine Gluconate Cloth  6 each Topical Daily  . diclofenac sodium  2 g Topical QID  . diltiazem  10 mg Intravenous Once  . levothyroxine  25 mcg Intravenous Daily  . metoprolol tartrate  5 mg Intravenous Q6H  . pantoprazole (PROTONIX) IV  40 mg Intravenous Q24H  . sodium chloride flush  10-40 mL Intracatheter Q12H   Continuous Infusions: . amiodarone 30 mg/hr (08/22/17 0500)  . diltiazem (CARDIZEM) infusion    . lactated ringers 75 mL/hr at 08/22/17 0500  . magnesium sulfate 1 - 4 g bolus IVPB 4 g (08/22/17 0905)  . piperacillin-tazobactam (ZOSYN)  IV 3.375 g (08/22/17 0754)   PRN Meds: hydrALAZINE, morphine injection, ondansetron (ZOFRAN) IV, phenol, sodium chloride flush   Vital Signs    Vitals:   08/22/17 0500 08/22/17 0745 08/22/17 0800 08/22/17 0810  BP:   (!) 149/49   Pulse: 66  72 71  Resp: 13  18 18   Temp:  98.1 F (36.7 C)    TempSrc:  Oral    SpO2: 94%  (!) 88% 93%  Weight:      Height:        Intake/Output Summary (Last 24 hours) at 08/22/2017 0953 Last data filed at 08/22/2017 0355 Gross per 24 hour  Intake 3225.6 ml  Output 725 ml  Net 2500.6 ml   Filed Weights   08/22/17 0000  Weight: 153 lb (69.4 kg)    Telemetry    NSR with intermittent sinus tachycardia - Personally Reviewed  Physical Exam   GEN: Laying in bed in no acute distress.   Neck: No JVD, no carotid bruits Cardiac:  RRR, + AS murmur, no rubs or gallops.  Respiratory: Clear to auscultation bilaterally, no wheezes/ rales/ rhonchi GI: NABS, soft, mild TTP, non-distended; +ostomy with air in bag, no stool MS: No edema; No deformity. Neuro:  Nonfocal, moving all extremities spontaneously Psych: Normal affect    Labs    Chemistry Recent Labs  Lab 08/18/17 1142  08/19/17 9741  08/20/17 0435 08/21/17 0322 08/21/17 0923 08/22/17 0348  NA 123*   < > 130*   < > 131* 128* 130* 133*  K 2.1*   < > 2.6*   < > 3.3* 3.3* 3.5 4.8  CL 80*   < > 93*   < > 98* 94*  --  99*  CO2 29   < > 26   < > 23 23  --  24  GLUCOSE 123*   < > 112*   < > 80 167*  --  98  BUN 6   < > 5*   < > <5* <5*  --  6  CREATININE 0.58   < > 0.68   < > 0.63 0.64  --  0.62  CALCIUM 9.1   < > 8.3*   < > 8.2* 8.2*  --  6.2*  PROT 7.9  --  6.1*  --   --   --   --   --   ALBUMIN 3.8  --  2.9*  --   --   --   --   --   AST 28  --  23  --   --   --   --   --  ALT 12*  --  11*  --   --   --   --   --   ALKPHOS 100  --  79  --   --   --   --   --   BILITOT 0.4  --  0.6  --   --   --   --   --   GFRNONAA >60   < > >60   < > >60 >60  --  >60  GFRAA >60   < > >60   < > >60 >60  --  >60  ANIONGAP 14   < > 11   < > 10 11  --  10   < > = values in this interval not displayed.     Hematology Recent Labs  Lab 08/20/17 0435 08/21/17 0322 08/21/17 0923 08/22/17 0348  WBC 11.4* 12.0*  --  12.2*  RBC 3.43* 3.16*  --  4.25  HGB 9.1* 8.5* 10.5* 11.5*  HCT 28.6* 26.1* 31.0* 35.4*  MCV 83.4 82.6  --  83.3  MCH 26.5 26.9  --  27.1  MCHC 31.8 32.6  --  32.5  RDW 14.5 14.5  --  15.8*  PLT 384 340  --  358    Cardiac Enzymes Recent Labs  Lab 08/19/17 1241 08/19/17 1655 08/20/17 0015 08/20/17 0435  TROPONINI 0.77* 0.56* 0.45* 0.28*    Recent Labs  Lab 08/18/17 1650  TROPIPOC 0.08     BNPNo results for input(s): BNP, PROBNP in the last 168 hours.   DDimer No results for input(s): DDIMER in the last 168 hours.   Radiology    Dg C-arm 1-60 Min-no Report  Result Date: 08/21/2017 Fluoroscopy was utilized by the requesting physician.  No radiographic interpretation.    Cardiac Studies   Echocardiogram 08/19/17: Study Conclusions  - Left ventricle: The cavity size was normal. Wall thickness was   increased in a  pattern of mild LVH. Systolic function was   vigorous. The estimated ejection fraction was in the range of 65%   to 70%. Wall motion was normal; there were no regional wall   motion abnormalities. Left ventricular diastolic function   parameters were normal. - Aortic valve: Valve mobility was restricted. - Mitral valve: Moderately calcified annulus. - Left atrium: The atrium was moderately dilated. - Right atrium: The atrium was mildly dilated. - Tricuspid valve: There was severe regurgitation.  Patient Profile     77 y.o. female with PMH CAD s/p CABG 2005, HTN, Dyslipidemia, found to have atrial fibrillation with RVR which spontaneously converted to NSR, also with colonic mass. Cardiology following in perioperative setting.    Assessment & Plan    1. New onset paroxysmal atrial fibrillation: brief episode 3/21 that resolved spontaneously. Patient was started on IV diltiazem and amiodarone in an attempt to prevent reoccurrence in the perioperative setting. Patient is maintaining NSR. Anticoagulation deferred given transient episode in the setting of acute bowel process.  - Will transition to po diltiazem 155m long acting today - Continue amiodarone today and will plan to discontinue tomorrow - May need to consider anticoagulation in the future if reoccurrence of Afib given CHA2DS2-VASc Score of 4 (HTN, prior CAGB, and age > 769     2. CAD s/p CABG: patient with demand ischemia in the setting of Afib RVR. Trop peaked at 0.77. EKG without ischemia. Ischemic work-up deferred in the setting of acute bowel process. Echo with EF 65-70% and no wall motion abnormalities. -  Intolerant to statins and not interested in PCSK9 inhibitors.  - Can consider NST outpatient after resolution of present illness  3. Colonic mass s/p resection: s/p laparoscopic diverting loop transvers colostomy 3/24. Plans to advance to CLD today. Hgb stable at 11.5. - Continue management per surgery  4. L ureter  obstruction 2/2 colonic mass: s/p cystoscopy with L retrograde pyelogram with L JJ stent placement 3/24 - Continue management per urology  5. HTN: BP stable - Continue current regimen  6. Hypokalemia: K 4.8 today after repletion yesterday - Continue to monitor closely  7. Aortic Stenosis: stable without syncopal events - Continue to monitor clinically.    For questions or updates, please contact [place] Please consult [email or web address] for contact info under Cardiology/STEMI.      Signed, Abigail Butts, PA-C  08/22/2017, 9:53 AM   [phone]  Doing well post op. Exam with NSR , AS murmur clear lungs Post colostomy with mild tympany less Pain no edema. Change cardizem to PO will d/c iv amiodarone in am if maintains NSR next 24 hours  [employer]

## 2017-08-22 NOTE — Consult Note (Addendum)
[place] Nurse ostomy consult note Stoma type/location:  Pt had colostomy surgery performed on 3/24 Stomal assessment/size: Stoma red and viable, above skin level, 2 inches and edematous.  Large "X" shaped rod in place through the stoma and sutured in all 4 corners; it will be difficult to maintain a pouch seal and current pouch is leaking. Peristomal assessment: intact skin surrounding Treatment options for stomal/peristomal skin:  Applied barrier ring around stoma to attempt to maintain a seal, and large 4 inch pouch.  It will be VERY DIFFICULT for patient to manage pouch changes at home if rod is not discontinued before discharge. Output: Small amt unformed brown stool Ostomy pouching: 2pc.  Education provided:  Demonstrated pouch change to patient and daughter at the bedside.  Pt is a retired Marine scientist and states she is familiar with pouch application and emptying. Pt and family member watched the process and asked appropiate questions. Reviewed pouching routines and ordering supplies. Supplies at the bedside for staff nurse use PRN if leaking occurs. Enrolled patient in [place] program: No; await removal of rod to determine what pouch size will be necessary. [facility] team will continue to assess daily until rod is removed. Julien Girt MSN, RN, [place], [place], [place]

## 2017-08-22 NOTE — Progress Notes (Signed)
Patient ID: Shannon Scott, female   DOB: 1940-12-24, 77 y.o.   MRN: [id number]    1 Day Post-Op  Subjective: Patient is sore, but not bad.  No nausea.  Not very hungry.  Objective: Vital signs in last 24 hours: Temp:  [97.5 F (36.4 C)-98.9 F (37.2 C)] 98.1 F (36.7 C) (03/25 0745) Pulse Rate:  [66-94] 71 (03/25 0810) Resp:  [13-22] 18 (03/25 0810) BP: (140-182)/(41-80) 149/49 (03/25 0800) SpO2:  [87 %-100 %] 93 % (03/25 0810) Arterial Line BP: (124-206)/(47-138) 147/70 (03/25 0500) Weight:  [153 lb (69.4 kg)] 153 lb (69.4 kg) (03/25 0000) Last BM Date: 08/19/17  Intake/Output from previous day: 03/24 0701 - 03/25 0700 In: 3849.6 [I.V.:3125.6; Blood:624; IV Piggyback:100] Out: 1175 [Urine:960; Stool:115; Blood:100] Intake/Output this shift: No intake/output data recorded.  PE: Abd: soft, obese, lap incisions are c/d/i with dermabond.  Loops transverse colostomy is viable.  Feculent output and air in her bag.  Lab Results:  Recent Labs    08/21/17 0322 08/21/17 0923 08/22/17 0348  WBC 12.0*  --  12.2*  HGB 8.5* 10.5* 11.5*  HCT 26.1* 31.0* 35.4*  PLT 340  --  358   BMET Recent Labs    08/21/17 0322 08/21/17 0923 08/22/17 0348  NA 128* 130* 133*  K 3.3* 3.5 4.8  CL 94*  --  99*  CO2 23  --  24  GLUCOSE 167*  --  98  BUN <5*  --  6  CREATININE 0.64  --  0.62  CALCIUM 8.2*  --  6.2*   PT/INR No results for input(s): LABPROT, INR in the last 72 hours. CMP     Component Value Date/Time   NA 133 (L) 08/22/2017 0348   NA 139 11/25/2016 1021   K 4.8 08/22/2017 0348   CL 99 (L) 08/22/2017 0348   CO2 24 08/22/2017 0348   GLUCOSE 98 08/22/2017 0348   BUN 6 08/22/2017 0348   BUN 20 11/25/2016 1021   CREATININE 0.62 08/22/2017 0348   CREATININE 0.69 08/02/2016 1519   CALCIUM 6.2 (LL) 08/22/2017 0348   PROT 6.1 (L) 08/19/2017 0337   ALBUMIN 2.9 (L) 08/19/2017 0337   AST 23 08/19/2017 0337   ALT 11 (L) 08/19/2017 0337   ALKPHOS 79 08/19/2017 0337   BILITOT 0.6 08/19/2017 0337   GFRNONAA >60 08/22/2017 0348   GFRAA >60 08/22/2017 0348   Lipase     Component Value Date/Time   LIPASE 21 08/18/2017 1142       Studies/Results: Dg C-arm 1-60 Min-no Report  Result Date: 08/21/2017 Fluoroscopy was utilized by the requesting physician.  No radiographic interpretation.    Anti-infectives: Anti-infectives (From admission, onward)   Start     Dose/Rate Route Frequency Ordered Stop   08/21/17 0910  piperacillin-tazobactam (ZOSYN) 3.375 GM/50ML IVPB    Note to Pharmacy:  Gigliotti, [name]: cabinet override      08/21/17 0910 08/21/17 1014   08/19/17 0000  piperacillin-tazobactam (ZOSYN) IVPB 3.375 g     3.375 g 12.5 mL/hr over 240 Minutes Intravenous Every 8 hours 08/18/17 1703     08/18/17 1545  piperacillin-tazobactam (ZOSYN) IVPB 3.375 g     3.375 g 100 mL/hr over 30 Minutes Intravenous  Once 08/18/17 1540 08/18/17 1707       Assessment/Plan History of endometrial cancer - s/p robotic hyst/BSO - Dr. Alycia Rossetti 12/30/15 History of left carotid disease Coronary artery disease Hypertension Hypothyroid Hyperlipidemia  Obstructing left colon mass withprobableperforation and abscess, POD 1  s/p laparoscopic diverting loop transverse colostomy 08-21-08 by Dr. Excell Seltzer -no nausea and good bowel function.  Will try some clear liquids today -WOC RN consult for new ostomy care -mobilize and pulm toilet -WBC stable at 12K Chronic constipation Left ureter obstruction secondary to the mass.POD 1, s/p cystoscopy with left retrograde pyelogram and left JJ stent placement -per urology Atrial fibrillation with ST depression - per cards, primary service Hyponatremia-133 this AM Hypokalemia- resolved, 4.8   FEN: clear liquids/IV fluids ID: Zosyn 3/21 =>>day 5 DVT: SCDs Foley: foley placed by uro in OR Follow up: Dr. Excell Seltzer   LOS: 4 days    Henreitta Cea , [facility] 08/22/2017, 8:40 AM Pager:  [phone] Consults: [phone] Mon-Fri 7:00 am-4:30 pm Sat-Sun 7:00 am-11:30 am

## 2017-08-22 NOTE — Discharge Instructions (Signed)
Please arrive at least 30 min before your appointment to complete your check in paperwork.  If you are unable to arrive 30 min prior to your appointment time we may have to cancel or reschedule you.  LAPAROSCOPIC SURGERY: POST OP INSTRUCTIONS  1. DIET: Follow a light bland diet the first 24 hours after arrival home, such as soup, liquids, crackers, etc. Be sure to include lots of fluids daily. Avoid fast food or heavy meals as your are more likely to get nauseated. Eat a low fat the next few days after surgery.  2. Take your usually prescribed home medications unless otherwise directed. 3. PAIN CONTROL:  1. Pain is best controlled by a usual combination of three different methods TOGETHER:  1. Ice/Heat 2. Over the counter pain medication 3. Prescription pain medication 2. Most patients will experience some swelling and bruising around the incisions. Ice packs or heating pads (30-60 minutes up to 6 times a day) will help. Use ice for the first few days to help decrease swelling and bruising, then switch to heat to help relax tight/sore spots and speed recovery. Some people prefer to use ice alone, heat alone, alternating between ice & heat. Experiment to what works for you. Swelling and bruising can take several weeks to resolve.  3. It is helpful to take an over-the-counter pain medication regularly for the first few weeks. Choose one of the following that works best for you:  1. Naproxen (Aleve, etc) Two 250m tabs twice a day 2. Ibuprofen (Advil, etc) Three 2050mtabs four times a day (every meal & bedtime) 3. Acetaminophen (Tylenol, etc) 500-65036mour times a day (every meal & bedtime) 4. A prescription for pain medication (such as oxycodone, hydrocodone, etc) should be given to you upon discharge. Take your pain medication as prescribed.  1. If you are having problems/concerns with the prescription medicine (does not control pain, nausea, vomiting, rash, itching, etc), please call us [phone])  [phone] to see if we need to switch you to a different pain medicine that will work better for you and/or control your side effect better. 2. If you need a refill on your pain medication, please contact your pharmacy. They will contact our office to request authorization. Prescriptions will not be filled after 5 pm or on week-ends. 4. Avoid getting constipated. Between the surgery and the pain medications, it is common to experience some constipation. Increasing fluid intake and taking a fiber supplement (such as Metamucil, Citrucel, FiberCon, MiraLax, etc) 1-2 times a day regularly will usually help prevent this problem from occurring. A mild laxative (prune juice, Milk of Magnesia, MiraLax, etc) should be taken according to package directions if there are no bowel movements after 48 hours.  5. Watch out for diarrhea. If you have many loose bowel movements, simplify your diet to bland foods & liquids for a few days. Stop any stool softeners and decrease your fiber supplement. Switching to mild anti-diarrheal medications (Kayopectate, Pepto Bismol) can help. If this worsens or does not improve, please call us.[place]. Wash / shower every day. You may shower over the dressings as they are waterproof. Continue to shower over incision(s) after the dressing is off. 7. Remove your waterproof bandages 5 days after surgery. You may leave the incision open to air. You may replace a dressing/Band-Aid to cover the incision for comfort if you wish.  8. ACTIVITIES as tolerated:  1. You may resume regular (light) daily activities beginning the next day--such as daily self-care, walking, climbing stairs--gradually  increasing activities as tolerated. If you can walk 30 minutes without difficulty, it is safe to try more intense activity such as jogging, treadmill, bicycling, low-impact aerobics, swimming, etc. 2. Save the most intensive and strenuous activity for last such as sit-ups, heavy lifting, contact sports, etc Refrain  from any heavy lifting or straining until you are off narcotics for pain control.  3. DO NOT PUSH THROUGH PAIN. Let pain be your guide: If it hurts to do something, don't do it. Pain is your body warning you to avoid that activity for another week until the pain goes down. 4. You may drive when you are no longer taking prescription pain medication, you can comfortably wear a seatbelt, and you can safely maneuver your car and apply brakes. 5. You may have sexual intercourse when it is comfortable.  9. FOLLOW UP in our office  1. Please call [facility] at [phone] to set up an appointment to see your surgeon in the office for a follow-up appointment approximately 2-3 weeks after your surgery. 2. Make sure that you call for this appointment the day you arrive home to insure a convenient appointment time.      10. IF YOU HAVE DISABILITY OR FAMILY LEAVE FORMS, BRING THEM TO THE               OFFICE FOR PROCESSING.   WHEN TO CALL us [phone]:  1. Poor pain control 2. Reactions / problems with new medications (rash/itching, nausea, etc)  3. Fever over 101.5 F (38.5 C) 4. Inability to urinate 5. Nausea and/or vomiting 6. Worsening swelling or bruising 7. Continued bleeding from incision. 8. Increased pain, redness, or drainage from the incision  The clinic staff is available to answer your questions during regular business hours (8:30am-5pm). Please dont hesitate to call and ask to speak to one of our nurses for clinical concerns.  If you have a medical emergency, go to the nearest emergency room or call 911.  A surgeon from [facility] is always on call at the [facility], [place], [place], [place], [place] [address] ?  MAIN: [phone] ? TOLL FREE: [phone] ?  FAX [phone]  [email or web address]

## 2017-08-22 NOTE — Progress Notes (Signed)
PROGRESS NOTE [facility]   Shannon Scott   OEU:[id number] DOB: 1941-02-17  DOA: 08/18/2017 PCP: [name], MD   Brief Narrative:  Shannon Scott is a 77 year old female with medical history significant for coronary artery disease status post CABG, endometrial cancer status post total hysterectomy, hypothyroidism who presented to the emergency department complaining of generalized abdominal pain nausea and vomiting.  CT scan of the abdomen and pelvis showed heterogeneous ill defined mass involving the sigmoid colon in the left pelvic with suspicious perforation and abscess formation.  Patient also was found to be on new onset A. fib with RVR.  Patient was admitted with working diagnosis of colon mass complicated with A. fib with RVR.  General surgery and cardiology has been consulted.  On CT was noted that the mass was compressing left ureter causing dilation therefore urology was consulted for possible stent placement.  Subjective: Patient seen and examined, she is doing well.  Some abdominal pain.  Denies nausea and vomiting.  No acute events overnight.  Blood pressure much better.  Assessment & Plan: Colon mass Likely malignant - pathology pending S/p laparoscopic diverting loop transverse colostomy - 08/21/17 Patient with history of endometrial cancer 2 years ago with total hysterectomy. Per surgical team advancing to clear liquids today, mobilize will order PT.   Bowel perforation/abscess CT scan report focal gas and fluid collection consistent with bowel perforation. Patient currently on Zosyn, will de-escalate therapy when patient able to tolerate p.o.  Will need 10-14 days of antibiotic therapy. Blood cultures negative so far.  Remains afebrile.  Continue supportive treatment  Ureteral dilation/mild hydronephrosis Status post cystoscopy, left retrograde pyelogram with left JJ stent placement Continue management per urology  A. fib with RVR Patient remains in  normal sinus rhythm.  Heart rate well controlled. Patient continues on amiodarone drip and metoprolol 5 mg every 6 hours.  Hopefully patient start using oral route so we can transition to oral medications Currently anticoagulation on hold will defer to cardiology and surgical team Echocardiogram LVEF 65-70%, mild LVH pattern, no regional wall abnormality. Continue to monitor  Hypertension Blood pressure more controlled Continue metoprolol 5 mg every 6 hours, hydralazine as needed Monitor BP  Hypokalemia Resolved  Hypomagnesemia Replete  Anemia likely of chronic disease Multifactorial, hemodilution, colonic mass and  postop  She was transfused 2 units of PRBCs during surgery No  signs of overt bleeding, patient is asymptomatic. Transfuse if hemoglobin less than 8  DVT prophylaxis: SCDs Code Status: Full code Family Communication: Daughter at bedside Disposition Plan: Keep SDU for now as patient remains on amiodarone drip for A. fib control  Consultants:   General surgery  Urology  Cardiology  Procedures:   Antimicrobials:  Zosyn 3/19 ->   Objective: Vitals:   08/22/17 0500 08/22/17 0745 08/22/17 0800 08/22/17 0810  BP:   (!) 149/49   Pulse: 66  72 71  Resp: 13  18 18   Temp:  98.1 F (36.7 C)    TempSrc:  Oral    SpO2: 94%  (!) 88% 93%  Weight:      Height:        Intake/Output Summary (Last 24 hours) at 08/22/2017 0849 Last data filed at 08/22/2017 0500 Gross per 24 hour  Intake 3849.6 ml  Output 1175 ml  Net 2674.6 ml   Filed Weights   08/22/17 0000  Weight: 69.4 kg (153 lb)    Examination:  General: NAD Cardiovascular: RRR, S1/S2 +, no rubs, no gallops Respiratory:  Breath sounds diminished bilaterally Abdominal: Abdomen is soft, colostomy bag in place, mild tender Extremities: no edema  Data Reviewed: I have personally reviewed following labs and imaging studies  CBC: Recent Labs  Lab 08/18/17 1142 08/20/17 0435 08/21/17 0322  08/21/17 0923 08/22/17 0348  WBC 9.0 11.4* 12.0*  --  12.2*  NEUTROABS  --  10.0* 10.6*  --  10.5*  HGB 10.7* 9.1* 8.5* 10.5* 11.5*  HCT 32.4* 28.6* 26.1* 31.0* 35.4*  MCV 81.0 83.4 82.6  --  83.3  PLT 515* 384 340  --  924   Basic Metabolic Panel: Recent Labs  Lab 08/19/17 0337 08/19/17 1655 08/20/17 0435 08/21/17 0322 08/21/17 0923 08/22/17 0348  NA 130* 132* 131* 128* 130* 133*  K 2.6* 3.2* 3.3* 3.3* 3.5 4.8  CL 93* 100* 98* 94*  --  99*  CO2 26 23 23 23   --  24  GLUCOSE 112* 91 80 167*  --  98  BUN 5* <5* <5* <5*  --  6  CREATININE 0.68 0.58 0.63 0.64  --  0.62  CALCIUM 8.3* 7.4* 8.2* 8.2*  --  6.2*  MG 2.4 1.8 1.9 1.5*  --  1.2*  PHOS  --  1.9*  --   --   --   --    GFR: Estimated Creatinine Clearance: 54.1 mL/min (by C-G formula based on SCr of 0.62 mg/dL). Liver Function Tests: Recent Labs  Lab 08/18/17 1142 08/19/17 0337  AST 28 23  ALT 12* 11*  ALKPHOS 100 79  BILITOT 0.4 0.6  PROT 7.9 6.1*  ALBUMIN 3.8 2.9*   Recent Labs  Lab 08/18/17 1142  LIPASE 21   No results for input(s): AMMONIA in the last 168 hours. Coagulation Profile: No results for input(s): INR, PROTIME in the last 168 hours. Cardiac Enzymes: Recent Labs  Lab 08/19/17 1241 08/19/17 1655 08/20/17 0015 08/20/17 0435  TROPONINI 0.77* 0.56* 0.45* 0.28*   BNP (last 3 results) No results for input(s): PROBNP in the last 8760 hours. HbA1C: No results for input(s): HGBA1C in the last 72 hours. CBG: No results for input(s): GLUCAP in the last 168 hours. Lipid Profile: No results for input(s): CHOL, HDL, LDLCALC, TRIG, CHOLHDL, LDLDIRECT in the last 72 hours. Thyroid Function Tests: No results for input(s): TSH, T4TOTAL, FREET4, T3FREE, THYROIDAB in the last 72 hours. Anemia Panel: No results for input(s): VITAMINB12, FOLATE, FERRITIN, TIBC, IRON, RETICCTPCT in the last 72 hours. Sepsis Labs: Recent Labs  Lab 08/18/17 1317 08/18/17 1652  LATICACIDVEN 0.97 0.97    Recent  Results (from the past 240 hour(s))  MRSA PCR Screening     Status: None   Collection Time: 08/18/17  7:43 PM  Result Value Ref Range Status   MRSA by PCR NEGATIVE NEGATIVE Final    Comment:        The GeneXpert MRSA Assay (FDA approved for NASAL specimens only), is one component of a comprehensive MRSA colonization surveillance program. It is not intended to diagnose MRSA infection nor to guide or monitor treatment for MRSA infections. Performed at [facility], [place] [address] [place], [place] [address]   Culture, blood (routine x 2)     Status: None (Preliminary result)   Collection Time: 08/18/17  7:54 PM  Result Value Ref Range Status   Specimen Description   Final    BLOOD RIGHT ANTECUBITAL Performed at [place] [address], [place], [place] [address]    Special Requests  Final    BOTTLES DRAWN AEROBIC AND ANAEROBIC Blood Culture adequate volume Performed at [place] [address], [place], [place] [address]    Culture   Final    NO GROWTH 3 DAYS Performed at [facility], [place] [address] [place], [place] [address]    Report Status PENDING  Incomplete  Culture, blood (routine x 2)     Status: None (Preliminary result)   Collection Time: 08/18/17  7:54 PM  Result Value Ref Range Status   Specimen Description   Final    BLOOD RIGHT FOREARM Performed at [place] [address], [place], [place] [address]    Special Requests   Final    BOTTLES DRAWN AEROBIC ONLY Blood Culture results may not be optimal due to an inadequate volume of blood received in culture bottles Performed at [place] [address], [place], [place] [address]    Culture   Final    NO GROWTH 3 DAYS Performed at [facility], [place] [address], [place],  [address]    Report Status PENDING  Incomplete     Radiology Studies: Dg C-arm 1-60 Min-no Report  Result  Date: 08/21/2017 Fluoroscopy was utilized by the requesting physician.  No radiographic interpretation.    Scheduled Meds: . Chlorhexidine Gluconate Cloth  6 each Topical Daily  . diclofenac sodium  2 g Topical QID  . diltiazem  10 mg Intravenous Once  . levothyroxine  25 mcg Intravenous Daily  . metoprolol tartrate  5 mg Intravenous Q6H  . pantoprazole (PROTONIX) IV  40 mg Intravenous Q24H  . sodium chloride flush  10-40 mL Intracatheter Q12H   Continuous Infusions: . amiodarone 30 mg/hr (08/22/17 0500)  . diltiazem (CARDIZEM) infusion    . lactated ringers 75 mL/hr at 08/22/17 0500  . magnesium sulfate 1 - 4 g bolus IVPB    . piperacillin-tazobactam (ZOSYN)  IV 3.375 g (08/22/17 0754)     LOS: 4 days    Time spent: Total of 25 minutes spent with pt, greater than 50% of which was spent in discussion of  treatment, counseling and coordination of care  Chipper Oman, MD Pager: Text Page via [email or web address]   If 7PM-7AM, please contact night-coverage [email or web address] 08/22/2017, 8:49 AM   Note - This record has been created using [employer]. Chart creation errors have been sought, but may not always have been located. Such creation errors do not reflect on the standard of medical care.

## 2017-08-22 NOTE — Progress Notes (Signed)
1 Day Post-Op Subjective: No acute events overnight.  Abdominal pain improved.  Denies flank pain.    Objective: Vital signs in last 24 hours: Temp:  [98.1 F (36.7 C)-98.9 F (37.2 C)] 98.2 F (36.8 C) (03/25 1145) Pulse Rate:  [66-94] 70 (03/25 1200) Resp:  [13-22] 16 (03/25 1200) BP: (140-167)/(41-67) 155/45 (03/25 1200) SpO2:  [87 %-97 %] 95 % (03/25 1200) Arterial Line BP: (124-187)/(47-91) 147/70 (03/25 0500) Weight:  [69.4 kg (153 lb)] 69.4 kg (153 lb) (03/25 0000)  Intake/Output from previous day: 03/24 0701 - 03/25 0700 In: 3849.6 [I.V.:3125.6; Blood:624; IV Piggyback:100] Out: 1175 [Urine:960; Stool:115; Blood:100]  Intake/Output this shift: Total I/O In: -  Out: 50 [Stool:50]  Physical Exam:  General: Alert and oriented CV: RRR, palpable distal pulses Lungs: CTAB, equal chest rise Abdomen: Soft, NTND, no rebound or guarding GU: Foley catheter in place and draining slightly blood-tinged urine Ext: NT, No erythema  Lab Results: Recent Labs    08/21/17 0322 08/21/17 0923 08/22/17 0348  HGB 8.5* 10.5* 11.5*  HCT 26.1* 31.0* 35.4*   BMET Recent Labs    08/21/17 0322 08/21/17 0923 08/22/17 0348  NA 128* 130* 133*  K 3.3* 3.5 4.8  CL 94*  --  99*  CO2 23  --  24  GLUCOSE 167*  --  98  BUN <5*  --  6  CREATININE 0.64  --  0.62  CALCIUM 8.2*  --  6.2*     Studies/Results: Dg C-arm 1-60 Min-no Report  Result Date: 08/21/2017 Fluoroscopy was utilized by the requesting physician.  No radiographic interpretation.    Assessment/Plan: 77 year old female with a history of CAD s/p CABG, endometrial cancer s/p TAH with a large left sided colonic mass resulting in left sided hydronephrosis, likely 2/2 extrinsic compression  S/p cystoscopy with left JJ stent placement on 08/21/2017  -Okay to remove Foley catheter from a GU standpoint.  Her mild hematuria is likely due to stent irritation of the bladder. -Will arrange outpatient follow-up in 3 months to  assess treatment course and arrange stent replacement, if necessary    LOS: 4 days   Ellison Hughs, MD 08/22/2017, 12:56 PM  [facility] Pager: [phone]

## 2017-08-23 DIAGNOSIS — I10 Essential (primary) hypertension: Secondary | ICD-10-CM

## 2017-08-23 LAB — CBC
HCT: 33.3 % — ABNORMAL LOW (ref 36.0–46.0)
Hemoglobin: 10.9 g/dL — ABNORMAL LOW (ref 12.0–15.0)
MCH: 27.1 pg (ref 26.0–34.0)
MCHC: 32.7 g/dL (ref 30.0–36.0)
MCV: 82.8 fL (ref 78.0–100.0)
Platelets: 286 10*3/uL (ref 150–400)
RBC: 4.02 MIL/uL (ref 3.87–5.11)
RDW: 15.5 % (ref 11.5–15.5)
WBC: 9.2 10*3/uL (ref 4.0–10.5)

## 2017-08-23 LAB — BASIC METABOLIC PANEL
Anion gap: 7 (ref 5–15)
BUN: 5 mg/dL — ABNORMAL LOW (ref 6–20)
CO2: 28 mmol/L (ref 22–32)
Calcium: 8.3 mg/dL — ABNORMAL LOW (ref 8.9–10.3)
Chloride: 99 mmol/L — ABNORMAL LOW (ref 101–111)
Creatinine, Ser: 0.6 mg/dL (ref 0.44–1.00)
GFR calc Af Amer: >60 mL/min (ref >60)
GFR calc non Af Amer: >60 mL/min (ref >60)
Glucose, Bld: 111 mg/dL — ABNORMAL HIGH (ref 65–99)
Potassium: 3.4 mmol/L — ABNORMAL LOW (ref 3.5–5.1)
Sodium: 134 mmol/L — ABNORMAL LOW (ref 135–145)

## 2017-08-23 LAB — CULTURE, BLOOD (ROUTINE X 2)
Culture: NO GROWTH
Culture: NO GROWTH
Special Requests: ADEQUATE

## 2017-08-23 LAB — MAGNESIUM: Magnesium: 1.8 mg/dL (ref 1.7–2.4)

## 2017-08-23 MED ORDER — POTASSIUM CHLORIDE CRYS ER 20 MEQ PO TBCR
40.0000 meq | EXTENDED_RELEASE_TABLET | ORAL | Status: AC
  Administered 2017-08-23 (×2): 40 meq via ORAL
  Filled 2017-08-23 (×2): qty 2

## 2017-08-23 MED ORDER — DILTIAZEM HCL ER COATED BEADS 240 MG PO CP24
240.0000 mg | ORAL_CAPSULE | Freq: Every day | ORAL | Status: DC
  Administered 2017-08-24 – 2017-08-26 (×3): 240 mg via ORAL
  Filled 2017-08-23 (×2): qty 1
  Filled 2017-08-23: qty 2

## 2017-08-23 MED ORDER — ENOXAPARIN SODIUM 40 MG/0.4ML ~~LOC~~ SOLN
40.0000 mg | SUBCUTANEOUS | Status: DC
  Administered 2017-08-23 – 2017-08-26 (×4): 40 mg via SUBCUTANEOUS
  Filled 2017-08-23 (×4): qty 0.4

## 2017-08-23 MED ORDER — AMOXICILLIN-POT CLAVULANATE 875-125 MG PO TABS
1.0000 | ORAL_TABLET | Freq: Two times a day (BID) | ORAL | Status: DC
  Administered 2017-08-23 – 2017-08-26 (×7): 1 via ORAL
  Filled 2017-08-23 (×7): qty 1

## 2017-08-23 MED ORDER — IRBESARTAN 300 MG PO TABS
300.0000 mg | ORAL_TABLET | Freq: Every day | ORAL | Status: DC
  Administered 2017-08-23 – 2017-08-26 (×4): 300 mg via ORAL
  Filled 2017-08-23 (×4): qty 1

## 2017-08-23 NOTE — Progress Notes (Signed)
Progress Note  Patient Name: Shannon Scott Date of Encounter: 08/23/2017  Primary Cardiologist: Pixie Casino, MD   Subjective   No complaints taking liquids   Inpatient Medications    Scheduled Meds: . amoxicillin-clavulanate  1 tablet Oral Q12H  . Chlorhexidine Gluconate Cloth  6 each Topical Daily  . diclofenac sodium  2 g Topical QID  . diltiazem  120 mg Oral Daily  . diltiazem  10 mg Intravenous Once  . enoxaparin (LOVENOX) injection  40 mg Subcutaneous Q24H  . irbesartan  300 mg Oral Daily  . levothyroxine  25 mcg Intravenous Daily  . pantoprazole (PROTONIX) IV  40 mg Intravenous Q24H  . potassium chloride  40 mEq Oral Q4H  . sodium chloride flush  10-40 mL Intracatheter Q12H   Continuous Infusions: . amiodarone 30 mg/hr (08/22/17 2300)  . lactated ringers 75 mL/hr at 08/22/17 2300   PRN Meds: hydrALAZINE, morphine injection, ondansetron (ZOFRAN) IV, phenol, sodium chloride flush   Vital Signs    Vitals:   08/23/17 0600 08/23/17 0740 08/23/17 0922 08/23/17 1000  BP: (!) 158/49  (!) 176/52 (!) 185/47  Pulse: (!) 54   66  Resp: 15   18  Temp:  98.6 F (37 C)    TempSrc:  Oral    SpO2: 93%   97%  Weight:      Height:        Intake/Output Summary (Last 24 hours) at 08/23/2017 1047 Last data filed at 08/23/2017 1025 Gross per 24 hour  Intake 2082.1 ml  Output 1115 ml  Net 967.1 ml   Filed Weights   08/22/17 0000  Weight: 153 lb (69.4 kg)    Telemetry  NSR rates 80-90 08/23/2017  Personally Reviewed  Physical Exam   GEN: Laying in bed in no acute distress.   Neck: No JVD, no carotid bruits Cardiac:  RRR, + AS murmur, no rubs or gallops.  Respiratory: Clear to auscultation bilaterally, no wheezes/ rales/ rhonchi GI: NABS, soft, mild TTP, non-distended; +ostomy with air in bag, no stool MS: No edema; No deformity. Neuro:  Nonfocal, moving all extremities spontaneously Psych: Normal affect  [facility] Line RUE   Labs    Chemistry Recent Labs    Lab 08/18/17 1142  08/19/17 8182  08/21/17 0322 08/21/17 0923 08/22/17 0348 08/23/17 0532  NA 123*   < > 130*   < > 128* 130* 133* 134*  K 2.1*   < > 2.6*   < > 3.3* 3.5 4.8 3.4*  CL 80*   < > 93*   < > 94*  --  99* 99*  CO2 29   < > 26   < > 23  --  24 28  GLUCOSE 123*   < > 112*   < > 167*  --  98 111*  BUN 6   < > 5*   < > <5*  --  6 5*  CREATININE 0.58   < > 0.68   < > 0.64  --  0.62 0.60  CALCIUM 9.1   < > 8.3*   < > 8.2*  --  6.2* 8.3*  PROT 7.9  --  6.1*  --   --   --   --   --   ALBUMIN 3.8  --  2.9*  --   --   --   --   --   AST 28  --  23  --   --   --   --   --  ALT 12*  --  11*  --   --   --   --   --   ALKPHOS 100  --  79  --   --   --   --   --   BILITOT 0.4  --  0.6  --   --   --   --   --   GFRNONAA >60   < > >60   < > >60  --  >60 >60  GFRAA >60   < > >60   < > >60  --  >60 >60  ANIONGAP 14   < > 11   < > 11  --  10 7   < > = values in this interval not displayed.     Hematology Recent Labs  Lab 08/21/17 0322 08/21/17 0923 08/22/17 0348 08/23/17 0532  WBC 12.0*  --  12.2* 9.2  RBC 3.16*  --  4.25 4.02  HGB 8.5* 10.5* 11.5* 10.9*  HCT 26.1* 31.0* 35.4* 33.3*  MCV 82.6  --  83.3 82.8  MCH 26.9  --  27.1 27.1  MCHC 32.6  --  32.5 32.7  RDW 14.5  --  15.8* 15.5  PLT 340  --  358 286    Cardiac Enzymes Recent Labs  Lab 08/19/17 1241 08/19/17 1655 08/20/17 0015 08/20/17 0435  TROPONINI 0.77* 0.56* 0.45* 0.28*    Recent Labs  Lab 08/18/17 1650  TROPIPOC 0.08     BNPNo results for input(s): BNP, PROBNP in the last 168 hours.   DDimer No results for input(s): DDIMER in the last 168 hours.   Radiology    No results found.  Cardiac Studies   Echocardiogram 08/19/17: Study Conclusions  - Left ventricle: The cavity size was normal. Wall thickness was   increased in a pattern of mild LVH. Systolic function was   vigorous. The estimated ejection fraction was in the range of 65%   to 70%. Wall motion was normal; there were no regional  wall   motion abnormalities. Left ventricular diastolic function   parameters were normal. - Aortic valve: Valve mobility was restricted. - Mitral valve: Moderately calcified annulus. - Left atrium: The atrium was moderately dilated. - Right atrium: The atrium was mildly dilated. - Tricuspid valve: There was severe regurgitation.  Patient Profile     77 y.o. female with PMH CAD s/p CABG 2005, HTN, Dyslipidemia, found to have atrial fibrillation with RVR which spontaneously converted to NSR, also with colonic mass. Cardiology following in perioperative setting.    Assessment & Plan    1. New onset paroxysmal atrial fibrillation: converted now 48 hours post op D/C iv amiodarone   2. CAD s/p CABG: patient with demand ischemia in the setting of Afib RVR. Trop peaked at 0.77. EKG without ischemia. Ischemic work-up deferred in the setting of acute bowel process. Echo with EF 65-70% and no wall motion abnormalities. - Intolerant to statins and not interested in PCSK9 inhibitors.  - Can consider NST outpatient after resolution of present illness  3. Colonic mass s/p resection: s/p laparoscopic diverting loop transvers colostomy 3/24. Plans to advance to CLD today. Hgb stable at 11.5. - Continue management per surgery  4. L ureter obstruction 2/2 colonic mass: s/p cystoscopy with L retrograde pyelogram with L JJ stent placement 3/24 - Continue management per urology  5. HTN: been elevated increase cardizem to 240 mg continue ARB and pain control   6. Hypokalemia: K 3.4 supplement per primary service  7. Aortic Stenosis: Sclerosis / mild stenosis stable    Will sign off   Jenkins Rouge

## 2017-08-23 NOTE — Progress Notes (Addendum)
PROGRESS NOTE [facility]   Shannon Scott   TIW:[id number] DOB: 1940/09/05  DOA: 08/18/2017 PCP: [name], MD   Brief Narrative:  Shannon Scott is a 77 year old female with medical history significant for coronary artery disease status post CABG, endometrial cancer status post total hysterectomy, hypothyroidism who presented to the emergency department complaining of generalized abdominal pain nausea and vomiting.  CT scan of the abdomen and pelvis showed heterogeneous ill defined mass involving the sigmoid colon in the left pelvic with suspicious perforation and abscess formation.  Patient also was found to be on new onset A. fib with RVR.  Patient was admitted with working diagnosis of colon mass complicated with A. fib with RVR.  General surgery and cardiology has been consulted.  On CT was noted that the mass was compressing left ureter causing dilation therefore urology was consulted for possible stent placement.  Subjective: Patient seen and examined, has mild nausea yesterday with the clears but did not throw up.  No abdominal pain.  Now complaining of left hip pain from her bursitis.  Afebrile overnight, no other concerns  Assessment & Plan: Colon mass Likely malignant - awaiting pathology S/p laparoscopic diverting loop transverse colostomy - 08/21/17 Patient with history of endometrial cancer 2 years ago with total hysterectomy. Continue management per surgical team, patient on clears, out of bed as tolerated, PT has been ordered  Bowel perforation/abscess CT scan report focal gas and fluid collection consistent with bowel perforation. Patient currently on Zosyn, able to tolerate some clear will switch to Augmentin for total of 10 days Blood cultures so far negative, patient remains afebrile, continue supportive treatment  Ureteral dilation/mild hydronephrosis Status post cystoscopy, left retrograde pyelogram with left JJ stent placement D/C Foley, hematuria  likely due to stent irritation of the bladder Urology will arrange outpatient follow-up in 3 months  PAF with RVR - new onset Patient remains in normal sinus rhythms, heart rate well controlled She is currently on amiodarone and metoprolol IV, cardiology planning to switch to diltiazem p.o. Today. No anticoagulation at this moment given transient episode in setting of acute bowel process. CHADSVASc 4 - if A. fib recur she might need anticoagulation in the future Echocardiogram LVEF 65-70%, mild LVH pattern, no regional wall abnormality. Continue management per cardiology recommendation  Hypertension Blood pressure acceptable On metoprolol 5 mg every 6 hours, will add her home medication of Avapro and discontinue metoprolol as cardiology is planning to Cardizem Monitor BP closely  Hypokalemia Replete  Hypomagnesemia Resolved  Anemia likely of chronic disease Multifactorial, hemodilution, colonic mass and  postop  She was transfused 2 units of PRBCs during surgery Hemoglobin remained stable since transfusion No signs of overt bleeding, continue to monitor Transfuse if hemoglobin less than 8 given CAD  DVT prophylaxis: SCDs Code Status: Full code Family Communication: Daughter at bedside Disposition Plan: Transfer to  telemetry if patient comes of amiodarone drip  Consultants:   General surgery  Urology  Cardiology  Procedures:   Antimicrobials:  Zosyn 3/19 ->3/26  Augmentin 3/26 ->   Objective: Vitals:   08/22/17 1932 08/22/17 2000 08/22/17 2312 08/23/17 0315  BP:  (!) 147/62    Pulse:  65    Resp:  16    Temp: 98.8 F (37.1 C)  98.5 F (36.9 C) 98.1 F (36.7 C)  TempSrc: Oral  Oral Oral  SpO2:  94%    Weight:      Height:        Intake/Output Summary (  Last 24 hours) at 08/23/2017 0836 Last data filed at 08/23/2017 0011 Gross per 24 hour  Intake 2540.6 ml  Output 710 ml  Net 1830.6 ml   Filed Weights   08/22/17 0000  Weight: 69.4 kg (153 lb)     Examination:  General: NAD Cardiovascular: RRR, E9/M0 + systolic murmur Respiratory: CTA bilaterally, no wheezing, no rhonchi Abdominal: Abdomen soft, colostomy bag in place, tender to palpation Extremities: no edema.   Data Reviewed: I have personally reviewed following labs and imaging studies  CBC: Recent Labs  Lab 08/18/17 1142 08/20/17 0435 08/21/17 0322 08/21/17 0923 08/22/17 0348 08/23/17 0532  WBC 9.0 11.4* 12.0*  --  12.2* 9.2  NEUTROABS  --  10.0* 10.6*  --  10.5*  --   HGB 10.7* 9.1* 8.5* 10.5* 11.5* 10.9*  HCT 32.4* 28.6* 26.1* 31.0* 35.4* 33.3*  MCV 81.0 83.4 82.6  --  83.3 82.8  PLT 515* 384 340  --  358 768   Basic Metabolic Panel: Recent Labs  Lab 08/19/17 1655 08/20/17 0435 08/21/17 0322 08/21/17 0923 08/22/17 0348 08/23/17 0532  NA 132* 131* 128* 130* 133* 134*  K 3.2* 3.3* 3.3* 3.5 4.8 3.4*  CL 100* 98* 94*  --  99* 99*  CO2 23 23 23   --  24 28  GLUCOSE 91 80 167*  --  98 111*  BUN <5* <5* <5*  --  6 5*  CREATININE 0.58 0.63 0.64  --  0.62 0.60  CALCIUM 7.4* 8.2* 8.2*  --  6.2* 8.3*  MG 1.8 1.9 1.5*  --  1.2* 1.8  PHOS 1.9*  --   --   --   --   --    GFR: Estimated Creatinine Clearance: 54.1 mL/min (by C-G formula based on SCr of 0.6 mg/dL). Liver Function Tests: Recent Labs  Lab 08/18/17 1142 08/19/17 0337  AST 28 23  ALT 12* 11*  ALKPHOS 100 79  BILITOT 0.4 0.6  PROT 7.9 6.1*  ALBUMIN 3.8 2.9*   Recent Labs  Lab 08/18/17 1142  LIPASE 21   No results for input(s): AMMONIA in the last 168 hours. Coagulation Profile: No results for input(s): INR, PROTIME in the last 168 hours. Cardiac Enzymes: Recent Labs  Lab 08/19/17 1241 08/19/17 1655 08/20/17 0015 08/20/17 0435  TROPONINI 0.77* 0.56* 0.45* 0.28*   BNP (last 3 results) No results for input(s): PROBNP in the last 8760 hours. HbA1C: No results for input(s): HGBA1C in the last 72 hours. CBG: No results for input(s): GLUCAP in the last 168 hours. Lipid  Profile: No results for input(s): CHOL, HDL, LDLCALC, TRIG, CHOLHDL, LDLDIRECT in the last 72 hours. Thyroid Function Tests: No results for input(s): TSH, T4TOTAL, FREET4, T3FREE, THYROIDAB in the last 72 hours. Anemia Panel: No results for input(s): VITAMINB12, FOLATE, FERRITIN, TIBC, IRON, RETICCTPCT in the last 72 hours. Sepsis Labs: Recent Labs  Lab 08/18/17 1317 08/18/17 1652  LATICACIDVEN 0.97 0.97    Recent Results (from the past 240 hour(s))  MRSA PCR Screening     Status: None   Collection Time: 08/18/17  7:43 PM  Result Value Ref Range Status   MRSA by PCR NEGATIVE NEGATIVE Final    Comment:        The GeneXpert MRSA Assay (FDA approved for NASAL specimens only), is one component of a comprehensive MRSA colonization surveillance program. It is not intended to diagnose MRSA infection nor to guide or monitor treatment for MRSA infections. Performed at [facility],  [place] [address] [place], [place] [address]   Culture, blood (routine x 2)     Status: None (Preliminary result)   Collection Time: 08/18/17  7:54 PM  Result Value Ref Range Status   Specimen Description   Final    BLOOD RIGHT ANTECUBITAL Performed at [facility] [address] [place], [place] [address]    Special Requests   Final    BOTTLES DRAWN AEROBIC AND ANAEROBIC Blood Culture adequate volume Performed at [facility] [address], [place], [place] [address]    Culture   Final    NO GROWTH 4 DAYS Performed at [facility], [place] [address], [place], [place] [address]    Report Status PENDING  Incomplete  Culture, blood (routine x 2)     Status: None (Preliminary result)   Collection Time: 08/18/17  7:54 PM  Result Value Ref Range Status   Specimen Description   Final    BLOOD RIGHT FOREARM Performed at [facility] [address], [place], [place] [address]    Special Requests   Final    BOTTLES DRAWN  AEROBIC ONLY Blood Culture results may not be optimal due to an inadequate volume of blood received in culture bottles Performed at [facility] [address] [place], [place] [address]    Culture   Final    NO GROWTH 4 DAYS Performed at [facility], [place] [address], [place],  [address]    Report Status PENDING  Incomplete     Radiology Studies: Dg C-arm 1-60 Min-no Report  Result Date: 08/21/2017 Fluoroscopy was utilized by the requesting physician.  No radiographic interpretation.    Scheduled Meds: . Chlorhexidine Gluconate Cloth  6 each Topical Daily  . diclofenac sodium  2 g Topical QID  . diltiazem  120 mg Oral Daily  . diltiazem  10 mg Intravenous Once  . enoxaparin (LOVENOX) injection  40 mg Subcutaneous Q24H  . levothyroxine  25 mcg Intravenous Daily  . metoprolol tartrate  5 mg Intravenous Q6H  . pantoprazole (PROTONIX) IV  40 mg Intravenous Q24H  . sodium chloride flush  10-40 mL Intracatheter Q12H   Continuous Infusions: . amiodarone 30 mg/hr (08/22/17 2300)  . lactated ringers 75 mL/hr at 08/22/17 2300  . piperacillin-tazobactam (ZOSYN)  IV 3.375 g (08/23/17 0823)     LOS: 5 days   Time spent: Total of 25 minutes spent with pt, greater than 50% of which was spent in discussion of  treatment, counseling and coordination of care  Chipper Oman, MD Pager: Text Page via [email or web address]   If 7PM-7AM, please contact night-coverage [email or web address] 08/23/2017, 8:36 AM   Note - This record has been created using [employer]. Chart creation errors have been sought, but may not always have been located. Such creation errors do not reflect on the standard of medical care.

## 2017-08-23 NOTE — Evaluation (Signed)
Physical Therapy Evaluation Patient Details Name: Shannon Scott MRN: [id number] DOB: [birth year] Today's Date: 08/23/2017   History of Present Illness  77 yo female admitted 08/18/17 for Obstructing left colon mass with probable perforation and abscess,  s/p laparoscopic diverting loop transverse colostomy 08/21/17, new onset a fib.  Clinical Impression  The patient prefers to Dc home but does not have 24 hour caregivers. The patient did mobilize with 2 assist to recliner. Pt admitted with above diagnosis. Pt currently with functional limitations due to the deficits listed below (see PT Problem List).  Pt will benefit from skilled PT to increase their independence and safety with mobility to allow discharge to the venue listed below.   HR in 90's sats on RA > 92%     Follow Up Recommendations SNF    Equipment Recommendations  Rolling walker with 5" wheels    Recommendations for Other Services       Precautions / Restrictions Precautions Precautions: Fall Precaution Comments: colostomy, recent afib      Mobility  Bed Mobility Overal bed mobility: Needs Assistance Bed Mobility: Rolling;Sidelying to Sit Rolling: Mod assist Sidelying to sit: Mod assist       General bed mobility comments: extra time to  move legs, assist with trunk  Transfers Overall transfer level: Needs assistance Equipment used: Rolling walker (2 wheeled) Transfers: Sit to/from [facility] Sit to Stand: Mod assist;+2 physical assistance;+2 safety/equipment Stand pivot transfers: Mod assist;+2 physical assistance;+2 safety/equipment       General transfer comment: steady assist to rise from bed, cues for safety, small shuffle steps to recliner with 2 assist.  Ambulation/Gait                Stairs            Wheelchair Mobility    Modified Rankin (Stroke Patients Only)       Balance                                             Pertinent  Vitals/Pain Pain Assessment: Faces Faces Pain Scale: Hurts even more Pain Location: abdomen Pain Descriptors / Indicators: Discomfort;Grimacing Pain Intervention(s): Monitored during session;Premedicated before session    Home Living Family/patient expects to be discharged to:: Private residence Living Arrangements: Alone   Type of Home: House Home Access: Level entry     Home Layout: One level Home Equipment: None      Prior Function Level of Independence: Independent               Hand Dominance        Extremity/Trunk Assessment   Upper Extremity Assessment Upper Extremity Assessment: Generalized weakness    Lower Extremity Assessment Lower Extremity Assessment: Generalized weakness    Cervical / Trunk Assessment Cervical / Trunk Assessment: Normal  Communication      Cognition Arousal/Alertness: Awake/alert Behavior During Therapy: WFL for tasks assessed/performed Overall Cognitive Status: Within Functional Limits for tasks assessed                                        General Comments      Exercises     Assessment/Plan    PT Assessment Patient needs continued PT services  PT Problem List Decreased strength;Decreased range of motion;Decreased knowledge  of use of DME;Decreased activity tolerance;Decreased safety awareness;Decreased knowledge of precautions;Decreased mobility;Cardiopulmonary status limiting activity;Pain       PT Treatment Interventions DME instruction;Therapeutic exercise;Gait training;Functional mobility training;Patient/family education    PT Goals (Current goals can be found in the Care Plan section)  Acute Rehab PT Goals Patient Stated Goal: I want to go home PT Goal Formulation: With patient Time For Goal Achievement: 09/06/17 Potential to Achieve Goals: Good    Frequency Min 2X/week   Barriers to discharge Decreased caregiver support      Co-evaluation               AM-PAC PT "6 Clicks"  Daily Activity  Outcome Measure Difficulty turning over in bed (including adjusting bedclothes, sheets and blankets)?: Unable Difficulty moving from lying on back to sitting on the side of the bed? : Unable Difficulty sitting down on and standing up from a chair with arms (e.g., wheelchair, bedside commode, etc,.)?: Unable Help needed moving to and from a bed to chair (including a wheelchair)?: Total Help needed walking in hospital room?: Total Help needed climbing 3-5 steps with a railing? : Total 6 Click Score: 6    End of Session   Activity Tolerance: Patient limited by fatigue Patient left: in chair;with call bell/phone within reach;with chair alarm set Nurse Communication: Mobility status PT Visit Diagnosis: Unsteadiness on feet (R26.81);Pain    Time: 6333-5456 PT Time Calculation (min) (ACUTE ONLY): 29 min   Charges:   PT Evaluation $PT Eval Moderate Complexity: 1 Mod PT Treatments $Therapeutic Activity: 8-22 mins   PT G Codes:         Claretha Cooper 08/23/2017, 2:45 PM Tresa Endo PT [phone]

## 2017-08-23 NOTE — Consult Note (Addendum)
WOC follow-up:  Pouch intact with good seal to colostomy.  Large amt semiformed brown stool. Will perform further teaching sessions when out of ICU.  Supplies at bedside for staff nurse use PRN if leaking occurs.  Julien Girt MSN, RN, [place], [place], [place]

## 2017-08-23 NOTE — Progress Notes (Signed)
Patient ID: Shannon Scott, female   DOB: 10-09-1940, 77 y.o.   MRN: [id number]    2 Days Post-Op  Subjective: Patient feels ok today.  Tolerated some of the clear liquids well yesterday with no nausea.  Has not mobilized yet per patient.  Foley just removed this morning.  Hasn't voided yet.  Objective: Vital signs in last 24 hours: Temp:  [98.1 F (36.7 C)-98.8 F (37.1 C)] 98.1 F (36.7 C) (03/26 0315) Pulse Rate:  [61-73] 65 (03/25 2000) Resp:  [14-28] 16 (03/25 2000) BP: (126-168)/(37-62) 147/62 (03/25 2000) SpO2:  [91 %-95 %] 94 % (03/25 2000) Last BM Date: 08/19/17  Intake/Output from previous day: 03/25 0701 - 03/26 0700 In: 2540.6 [P.O.:840; I.V.:1650.6; IV Piggyback:50] Out: 710 [Urine:660; Stool:50] Intake/Output this shift: No intake/output data recorded.  PE: Abd: soft, appropriately tender, ostomy with feculent output and air present.  Stoma is large and edematous, but viable.  Lab Results:  Recent Labs    08/22/17 0348 08/23/17 0532  WBC 12.2* 9.2  HGB 11.5* 10.9*  HCT 35.4* 33.3*  PLT 358 286   BMET Recent Labs    08/22/17 0348 08/23/17 0532  NA 133* 134*  K 4.8 3.4*  CL 99* 99*  CO2 24 28  GLUCOSE 98 111*  BUN 6 5*  CREATININE 0.62 0.60  CALCIUM 6.2* 8.3*   PT/INR No results for input(s): LABPROT, INR in the last 72 hours. CMP     Component Value Date/Time   NA 134 (L) 08/23/2017 0532   NA 139 11/25/2016 1021   K 3.4 (L) 08/23/2017 0532   CL 99 (L) 08/23/2017 0532   CO2 28 08/23/2017 0532   GLUCOSE 111 (H) 08/23/2017 0532   BUN 5 (L) 08/23/2017 0532   BUN 20 11/25/2016 1021   CREATININE 0.60 08/23/2017 0532   CREATININE 0.69 08/02/2016 1519   CALCIUM 8.3 (L) 08/23/2017 0532   PROT 6.1 (L) 08/19/2017 0337   ALBUMIN 2.9 (L) 08/19/2017 0337   AST 23 08/19/2017 0337   ALT 11 (L) 08/19/2017 0337   ALKPHOS 79 08/19/2017 0337   BILITOT 0.6 08/19/2017 0337   GFRNONAA >60 08/23/2017 0532   GFRAA >60 08/23/2017 0532   Lipase       Component Value Date/Time   LIPASE 21 08/18/2017 1142       Studies/Results: No results found.  Anti-infectives: Anti-infectives (From admission, onward)   Start     Dose/Rate Route Frequency Ordered Stop   08/21/17 0910  piperacillin-tazobactam (ZOSYN) 3.375 GM/50ML IVPB    Note to Pharmacy:  Gigliotti, [name]: cabinet override      08/21/17 0910 08/21/17 1014   08/19/17 0000  piperacillin-tazobactam (ZOSYN) IVPB 3.375 g     3.375 g 12.5 mL/hr over 240 Minutes Intravenous Every 8 hours 08/18/17 1703     08/18/17 1545  piperacillin-tazobactam (ZOSYN) IVPB 3.375 g     3.375 g 100 mL/hr over 30 Minutes Intravenous  Once 08/18/17 1540 08/18/17 1707       Assessment/Plan History of endometrial cancer- s/p robotic hyst/BSO - Dr. Alycia Rossetti 12/30/15 History of left carotid disease Coronary artery disease Hypertension Hypothyroid Hyperlipidemia  Obstructing left colon mass withprobableperforation and abscess, POD 2 s/p laparoscopic diverting loop transverse colostomy 08-21-08 by Dr. Excell Seltzer -no nausea and good bowel function.  advance to full liquids today -appreciate WOC, RN evaluation.  Will likely remove rod prior to discharge. -mobilize and pulm toilet (pulling 750 on IS) -I have written for the patient to be mobilized  TID.  She has got to get up and start moving around to help in her recovery. -WBC down to 9K. Chronic constipation Left ureter obstruction secondary to the mass.POD 2, s/p cystoscopy with left retrograde pyelogram and left JJ stent placement -per urology -foley just DC this am Atrial fibrillation with ST depression - per cards, primary service Hyponatremia-134this AM Hypokalemia-3.4 this am, per medicine   FEN: full liquids/IV fluids ID: Zosyn 3/21 =>>day6/? DVT: SCDs, Lovenox Foley: DC, voiding trial Follow up: Dr. Excell Seltzer     LOS: 5 days    Henreitta Cea , [facility] 08/23/2017, 8:44 AM Pager:  [phone] Consults: [phone] Mon-Fri 7:00 am-4:30 pm Sat-Sun 7:00 am-11:30 am

## 2017-08-24 DIAGNOSIS — N2882 Megaloureter: Secondary | ICD-10-CM

## 2017-08-24 DIAGNOSIS — E871 Hypo-osmolality and hyponatremia: Secondary | ICD-10-CM

## 2017-08-24 DIAGNOSIS — K56600 Partial intestinal obstruction, unspecified as to cause: Secondary | ICD-10-CM

## 2017-08-24 DIAGNOSIS — E876 Hypokalemia: Secondary | ICD-10-CM

## 2017-08-24 DIAGNOSIS — K631 Perforation of intestine (nontraumatic): Secondary | ICD-10-CM

## 2017-08-24 DIAGNOSIS — E038 Other specified hypothyroidism: Secondary | ICD-10-CM

## 2017-08-24 LAB — BASIC METABOLIC PANEL
Anion gap: 10 (ref 5–15)
BUN: <5 mg/dL — ABNORMAL LOW (ref 6–20)
CO2: 27 mmol/L (ref 22–32)
Calcium: 8.7 mg/dL — ABNORMAL LOW (ref 8.9–10.3)
Chloride: 97 mmol/L — ABNORMAL LOW (ref 101–111)
Creatinine, Ser: 0.54 mg/dL (ref 0.44–1.00)
GFR calc Af Amer: >60 mL/min (ref >60)
GFR calc non Af Amer: >60 mL/min (ref >60)
Glucose, Bld: 98 mg/dL (ref 65–99)
Potassium: 3.6 mmol/L (ref 3.5–5.1)
Sodium: 134 mmol/L — ABNORMAL LOW (ref 135–145)

## 2017-08-24 LAB — CBC WITH DIFFERENTIAL/PLATELET
Basophils Absolute: 0 10*3/uL (ref 0.0–0.1)
Basophils Relative: 0 %
Eosinophils Absolute: 0.3 10*3/uL (ref 0.0–0.7)
Eosinophils Relative: 4 %
HCT: 36.1 % (ref 36.0–46.0)
Hemoglobin: 11.5 g/dL — ABNORMAL LOW (ref 12.0–15.0)
Lymphocytes Relative: 8 %
Lymphs Abs: 0.6 10*3/uL — ABNORMAL LOW (ref 0.7–4.0)
MCH: 27.2 pg (ref 26.0–34.0)
MCHC: 31.9 g/dL (ref 30.0–36.0)
MCV: 85.3 fL (ref 78.0–100.0)
Monocytes Absolute: 0.6 10*3/uL (ref 0.1–1.0)
Monocytes Relative: 8 %
Neutro Abs: 6.2 10*3/uL (ref 1.7–7.7)
Neutrophils Relative %: 80 %
Platelets: 321 10*3/uL (ref 150–400)
RBC: 4.23 MIL/uL (ref 3.87–5.11)
RDW: 15.8 % — ABNORMAL HIGH (ref 11.5–15.5)
WBC: 7.8 10*3/uL (ref 4.0–10.5)

## 2017-08-24 LAB — MAGNESIUM: Magnesium: 1.7 mg/dL (ref 1.7–2.4)

## 2017-08-24 MED ORDER — BOOST / RESOURCE BREEZE PO LIQD CUSTOM: 1.0000 | Freq: Three times a day (TID) | ORAL | Status: DC

## 2017-08-24 NOTE — Consult Note (Addendum)
WOC follow-up:  Pouch intact with good seal to colostomy. Emptied mod amt semiformed brown stool. Will perform further teaching sessions when out of ICU.  Supplies at bedside for staff nurse use PRN if leaking occurs.  Difficult pouching situation related to rod sutured in place; please write order to discontinue rod from stoma whenever possible.  [address] MSN, [place], [place], [place], [place]

## 2017-08-24 NOTE — Plan of Care (Signed)
  Problem: Pain Managment: Goal: General experience of comfort will improve Outcome: Progressing   

## 2017-08-24 NOTE — Progress Notes (Addendum)
Patient ID: Shannon Scott, female   DOB: [birth year], 77 y.o.   MRN: [id number]    3 Days Post-Op  Subjective: Patient feels well today.  Tolerating full liquids with no nausea or issues.  Not eating a ton, but taking in some of each thing she is getting.  Up in the chair for a lot of the day yesterday.  Mobilizing to void in the bathroom.  Objective: Vital signs in last 24 hours: Temp:  [98 F (36.7 C)-98.5 F (36.9 C)] 98.1 F (36.7 C) (03/27 0800) Pulse Rate:  [61-87] 75 (03/27 0800) Resp:  [12-23] 23 (03/27 0800) BP: (122-193)/(47-86) 193/77 (03/27 0800) SpO2:  [94 %-100 %] 98 % (03/27 0800) Last BM Date: 08/23/17(colostomy)  Intake/Output from previous day: 03/26 0701 - 03/27 0700 In: 825 [I.V.:825] Out: 1955 [Urine:780; Stool:1175] Intake/Output this shift: Total I/O In: -  Out: 100 [Stool:100]  PE: Abd: soft, appropriately tender, incisions are c/d/i.  Ostomy with feculent output and air in the bag.  Bag intact with no leaking.  Red rubber bar in place. Stoma is pink and viable.  Lab Results:  Recent Labs    08/23/17 0532 08/24/17 0608  WBC 9.2 7.8  HGB 10.9* 11.5*  HCT 33.3* 36.1  PLT 286 321   BMET Recent Labs    08/23/17 0532 08/24/17 0608  NA 134* 134*  K 3.4* 3.6  CL 99* 97*  CO2 28 27  GLUCOSE 111* 98  BUN 5* <5*  CREATININE 0.60 0.54  CALCIUM 8.3* 8.7*   PT/INR No results for input(s): LABPROT, INR in the last 72 hours. CMP     Component Value Date/Time   NA 134 (L) 08/24/2017 0608   NA 139 11/25/2016 1021   K 3.6 08/24/2017 0608   CL 97 (L) 08/24/2017 0608   CO2 27 08/24/2017 0608   GLUCOSE 98 08/24/2017 0608   BUN <5 (L) 08/24/2017 0608   BUN 20 11/25/2016 1021   CREATININE 0.54 08/24/2017 0608   CREATININE 0.69 08/02/2016 1519   CALCIUM 8.7 (L) 08/24/2017 0608   PROT 6.1 (L) 08/19/2017 0337   ALBUMIN 2.9 (L) 08/19/2017 0337   AST 23 08/19/2017 0337   ALT 11 (L) 08/19/2017 0337   ALKPHOS 79 08/19/2017 0337   BILITOT 0.6  08/19/2017 0337   GFRNONAA >60 08/24/2017 0608   GFRAA >60 08/24/2017 0608   Lipase     Component Value Date/Time   LIPASE 21 08/18/2017 1142       Studies/Results: No results found.  Anti-infectives: Anti-infectives (From admission, onward)   Start     Dose/Rate Route Frequency Ordered Stop   08/23/17 1000  amoxicillin-clavulanate (AUGMENTIN) 875-125 MG per tablet 1 tablet     1 tablet Oral Every 12 hours 08/23/17 0858 09/02/17 0959   08/21/17 0910  piperacillin-tazobactam (ZOSYN) 3.375 GM/50ML IVPB    Note to Pharmacy:  Gigliotti, [name]: cabinet override      08/21/17 0910 08/21/17 1014   08/19/17 0000  piperacillin-tazobactam (ZOSYN) IVPB 3.375 g  Status:  Discontinued     3.375 g 12.5 mL/hr over 240 Minutes Intravenous Every 8 hours 08/18/17 1703 08/23/17 0858   08/18/17 1545  piperacillin-tazobactam (ZOSYN) IVPB 3.375 g     3.375 g 100 mL/hr over 30 Minutes Intravenous  Once 08/18/17 1540 08/18/17 1707       Assessment/Plan History of endometrial cancer- s/p robotic hyst/BSO - Dr. 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 Excell Seltzer -no nausea and good bowel function. advance to soft diet today and add resource breeze for extra protein, etc while appetite down. -appreciate WOC, RN evaluation.  Will likely remove rod prior to discharge. 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  Her hgb is actually increased to 11.5 today. Atrial fibrillation with ST  depression- per cards, primary service Hyponatremia-134this AM Hypokalemia-3.6 this am, per medicine   FEN: soft diet/IV fluids/resource breeze ID: Zosyn 3/21 =>>day7/? DVT: SCDs, Lovenox Foley:none, voiding well Follow up: Dr. Excell Seltzer  Dispo: ok for tx to floor from surgical standpoint.  Will cont to follow.   LOS: 6 days    Henreitta Cea , [facility] Surgery 08/24/2017, 8:52 AM Pager: [phone] Consults: [phone] Mon-Fri 7:00 am-4:30 pm Sat-Sun 7:00 am-11:30 am

## 2017-08-24 NOTE — Discharge Summary (Signed)
Physician Discharge Summary  ASHAUNTI TREPTOW JFH:[id number] DOB: [birth year] DOA: 08/18/2017  PCP: [name], MD  Admit date: 08/18/2017 Discharge date: 08/26/2017  Admitted From: Home Disposition: SNF  Recommendations for Outpatient Follow-up:  1. Follow up with PCP in 1-2 weeks 2. Follow up with General Surgery Dr. Excell Seltzer in 2 weeks 3. Follow up with Dr. Collene Mares in Gastroenterology for outpatient colonoscopy and biopsy of Mass 4. Follow up with Dr. Lovena Neighbours in Urology for Stent Replacement  5. Follow up with Cardiology Dr. Debara Pickett in 1 week 6. Follow up with Oncology as an outpatient  7. Follow up with Orthopedic Surgery as an outpatient for Hip Bursitis  8. Repeat CT Scan of Abdomen/Pelvis as an outpatient  9. Follow up with [facility] Nurse as an outpatient  10. Please obtain CMP/CBC, Mag, Phos in one week 11. Please follow up on the following pending results:  Home Health: No Equipment/Devices: Conservation officer, nature with 5" Wheels    Discharge Condition: Stable CODE STATUS: FULL CODE  Diet recommendation: Heart Healthy Soft Diet   Brief/Interim Summary: Shannon Scott a 77 year old female with medical history significant for coronary artery disease status post CABG, endometrial cancer status post total hysterectomy, hypothyroidism and other comorbidities who presented to the emergency department complaining of generalized abdominal pain nausea and vomiting. CT scan of the abdomen and pelvis showed heterogeneous ill defined mass involving the sigmoid colon in the left pelvic with suspicious perforation and abscess formation.  Patient also was found to be on new onset A. fib with RVR. Patient was admitted with working diagnosis of colon mass complicated with A. fib with RVR. General surgery and Cardiology were consulted. On CT was noted that the mass was compressing left ureter causing dilation therefore Urology was consulted and patient underwent JJ stenting and Laparoscopic Diverting  Loop Transverse Colostomy on 08/21/17. She steadily improved and PT evaluated and recommended SNF. Her biggest complaint at this time is chronic Left Hip pain from Bursitis. Her diet was advanced to soft diet and Heart rate converted to NSR. She was deemed medically stable to D/C to SNF and she will need to follow up with PCP, Cardiology, Urology, General Surgery, Gastroenterology and Orthopedic Surgery. Because no biopsies were take she will need Gastroenterology evaluation and colonoscopy and biopsy of the mass.   Discharge Diagnoses:  Principal Problem:   Colonic mass Active Problems:   Hypothyroid   Essential hypertension   S/P CABG x 3   Hyponatremia   Acute hypokalemia   Bowel perforation (HCC)   Ureteral dilatation   New onset atrial fibrillation (HCC)  Colon mass -Likely malignant -Per my discussion with Surgery PA no Biopsy was take so will need to be ascertained by Gastroenterology upon Colonoscopy after Discharge to determine etiology of abdominal process that prompted surgery -S/p laparoscopic diverting loop transverse colostomy - 08/21/17 -Patient with history of endometrial cancer 2 years ago with total hysterectomy. -Continue management per surgical team -Diet being advanced by General Surgery and is on Soft Diet and tolerating it well -Nutritionist consulted and recommending Ensure Enlive po BID -Ostomy bar was removed yesterda; [facility] nurse consulted -General Surgery recommending Follow up in 2-4 weeks and recommending repeat CT Scan to evaluate her Colon -Follow up with Gastroenterology Dr. Collene Mares   Bowel perforation/Abscess -CT scan report focal gas and fluid collection consistent with bowel perforation. -Patient was on Zosyn and able to tolerate some clears so was switched Augmentin for total of 10 days and will continue until tomorrow  -  Blood cultures showed NGTD at 5 days, patient remains afebrile, continue supportive treatment -Repeat CT Scan of Abd/Pelvis as an  outpatient   Ureteral dilation/mild hydronephrosis likely from Colonic Mass -Status post cystoscopy, left retrograde pyelogram with left JJ stent placement on 08/21/17 -D/C'd Foley,hematuria likely due to stent irritation of the bladder -Urology will arrange outpatient follow-up in 3 months for Stent Exchange   PAFwith RVR -new onset -Patient remains in normal sinus rhythm, heart rate well controlled now -C/w Diltiazem 240 mg po Daily; IV Amioadarone D/C'd -No anticoagulation at this moment given transient episode in setting of acute bowel process. -CHADSVASc 4 -if A. fib recur she might need anticoagulation in the future but per Cardiology no Anticoagulation for now given recent ostomy and self limiting time of Atrial Fibrillation -Echocardiogram LVEF 65-70%, mild LVH pattern, no regional wall abnormality. -Appreciated Management per Cardiology Recommendations and follow up with Dr. Debara Pickett as an outpatient   Hypertension -Blood pressure acceptable at 154/88 -C/w Irbesartan and Diltiazem  -C/w IV Hydralazine 10 mg IV q6hprn SBP>160 or DBP>100  Hypokalemia -Replete; K+ now 3.8 this AM -Continue to Monitor and Replete as Necessary -Repeat CMP at SNF  Hypomagnesemia -Resolved. Patient's Mag was 1.8 this AM -Continue to Monitor and Replete as Necessary  -Repeat Mag in AM   Hypophosphatemia -Patient's Phos Level was 3.4 -Continue to Monitor and Replete as Necessary -Repeat CMP in AM   Anemia likely of chronic disease -Multifactorial, hemodilution, colonic mass and postop  -She was transfused 2 units of PRBCs during surgery -Hemoglobin remained stable since transfusion and is now 11.0/33.9 -No signs of overt bleeding, continue to monitor -Transfuse if hemoglobin less than 8 given CAD  Elevated Troponin/CAD s/p CABG -Likley Demand Ischemia due to A Fib RVR -ECHO As below -Cardiology recommending considering NST as an outpatient after resolution of current  situation  Hyponatremia, improved -Mild at 134 and now 136 -Continue to Monitor at SNF  Hypothyroidism -C/w po Levothyroxine 50 mcg po Daily   Left Leg Swelling -Patient's Left Leg was painful and slightly more swollen than Right -Checked LE Duplex and was Negative for DVT  -? From Left Hip Bursitis -Follow up with Orthopedics as an outpatient   Left Hip Bursitis -Sees Orthopedics as an outpatient -Pain control with Home Tramadol and Acetaminophen  -Will need to follow up at D/C  Discharge Instructions Discharge Instructions    Call MD for:  difficulty breathing, headache or visual disturbances   Complete by:  As directed    Call MD for:  extreme fatigue   Complete by:  As directed    Call MD for:  hives   Complete by:  As directed    Call MD for:  persistant dizziness or light-headedness   Complete by:  As directed    Call MD for:  persistant nausea and vomiting   Complete by:  As directed    Call MD for:  redness, tenderness, or signs of infection (pain, swelling, redness, odor or green/yellow discharge around incision site)   Complete by:  As directed    Call MD for:  severe uncontrolled pain   Complete by:  As directed    Call MD for:  temperature >100.4   Complete by:  As directed    Diet - low sodium heart healthy   Complete by:  As directed    Soft Diet with Ensure Supplements   Discharge instructions   Complete by:  As directed    Follow up with PCP,  General Surgery, Gastroenterology, Cardiology, and Urology as an outpatient. Take all medications as prescribed. If symptoms change or worsen please return to the ED for evaluation.   Increase activity slowly   Complete by:  As directed      Allergies as of 08/26/2017      Reactions   Statins Other (See Comments)   Myalgias and memory problems   Ciprofloxacin Itching   Splotchy redness with itching during IV infusion localized to arm.      Medication List    STOP taking these medications    chlorthalidone 25 MG tablet Commonly known as:  HYGROTON     TAKE these medications   acetaminophen 500 MG tablet Commonly known as:  TYLENOL Take 500 mg by mouth every 6 (six) hours as needed for mild pain, moderate pain, fever or headache.   amoxicillin-clavulanate 875-125 MG tablet Commonly known as:  AUGMENTIN Take 1 tablet by mouth every 12 (twelve) hours for 2 days.   diclofenac sodium 1 % Gel Commonly known as:  VOLTAREN Apply 2 g topically 4 (four) times daily.   diltiazem 240 MG 24 hr capsule Commonly known as:  CARDIZEM CD Take 1 capsule (240 mg total) by mouth daily. Start taking on:  08/27/2017   feeding supplement (ENSURE ENLIVE) Liqd Take 237 mLs by mouth 2 (two) times daily between meals.   irbesartan 300 MG tablet Commonly known as:  AVAPRO take 1 tablet by mouth once daily   levothyroxine 50 MCG tablet Commonly known as:  SYNTHROID, LEVOTHROID Take 50 mcg by mouth daily.   pantoprazole 40 MG tablet Commonly known as:  PROTONIX Take 1 tablet (40 mg total) by mouth daily. Start taking on:  08/27/2017   polyethylene glycol packet Commonly known as:  MIRALAX / GLYCOLAX Take 17 g by mouth daily as needed for mild constipation.   traMADol 50 MG tablet Commonly known as:  ULTRAM Take 1 tablet (50 mg total) by mouth every 12 (twelve) hours as needed for moderate pain.      Follow-up Information    Ceasar Mons, MD In 3 months.   Specialty:  Urology Why:  Plan stent replacement Contact information: [address] [place] [address] [phone]        Excell Seltzer, MD. Schedule an appointment as soon as possible for a visit.   Specialty:  General Surgery Why:  in 3-4 weeks for post-operative follow-up Contact information: [address] [place] [address] [phone]        [name], MD. Call.   Specialty:  Internal Medicine Why:  Follow up within 1 week Contact information: [address] [place] [address] [phone]        Pixie Casino, MD .   Specialty:  Cardiology Contact information: [address] [place] [address] [phone]        Juanita Craver, MD. Call.   Specialty:  Gastroenterology Why:  Follow up within 1 week for evaluation of Outpatient Colonoscopy and Biopsy of Mass Contact information: [address], [place] [address] [phone]          Allergies  Allergen Reactions  . Statins Other (See Comments)    Myalgias and memory problems  . Ciprofloxacin Itching    Splotchy redness with itching during IV infusion localized to arm.   Consultations:  General Surgery  Urology  Cardiology  [facility] Nurse  Procedures/Studies: Dg C-arm 1-60  Min-no Report  Result Date: 08/21/2017 Fluoroscopy was utilized by the requesting physician.  No radiographic interpretation.   [place] Ekg Site Rite  Result Date: 08/19/2017 If Site Rite image not attached, placement could not be confirmed due to current cardiac rhythm.  Ct Angio Abd/pel W And/or Wo Contrast  Result Date: 08/18/2017 CLINICAL DATA:  Vomiting.  Abdominal pain. EXAM: CTA ABDOMEN AND PELVIS wITHOUT AND WITH CONTRAST TECHNIQUE: Multidetector CT imaging of the abdomen and pelvis was performed using the standard protocol during bolus administration of intravenous contrast. Multiplanar reconstructed images and MIPs were obtained and reviewed to evaluate the vascular anatomy. CONTRAST:  165m ISOVUE-370 IOPAMIDOL (ISOVUE-370) INJECTION 76% COMPARISON:  04/15/2016 FINDINGS: VASCULAR Aorta: There are atherosclerotic calcifications and irregular plaque in the abdominal aorta without aneurysmal dilatation or significant luminal narrowing. There is a penetrating ulcer in the right posterolateral aorta at the level of the renal arteries. Celiac: Patent. SMA: Patent. Renals: Single right and 2 left renal arteries are patent. IMA: Patent.  Inflow: Diffuse atherosclerotic calcifications in the bilateral common iliac arteries without significant narrowing. Internal and external iliac arteries are patent with scattered atherosclerotic changes. Proximal Outflow: Grossly patent. Review of the MIP images confirms the above findings. NON-VASCULAR Lower chest: Dependent atelectasis. Hepatobiliary: Diffuse hepatic steatosis. Gallbladder is distended. A few small layering gallstones are noted Pancreas: Unremarkable Spleen: Unremarkable Adrenals/Urinary Tract: Moderate left hydronephrosis. Left hydroureter. Simple cyst in the right kidney. Adrenal glands are within normal limits. The left ureter is dilated to the left hemipelvis. It becomes narrowed in the inflammatory pelvic mass. This is described below. Bladder is within normal limits. Stomach/Bowel: Large hiatal hernia is unchanged. Stable prominent gastric folds within the hiatal hernia. The colon is diffusely distended. Distal small bowel is distended. A transition point between dilated large bowel and decompressed large bowel occurs in the sigmoid colon. There is a heterogeneous ill-defined mass involving the sigmoid colon and left side of the pelvis which includes the colon wall, adjacent fat, and both fluid and gas elements. Findings are suspicious for a focal perforation with abscess formation. Underlying malignancy is a strong consideration given the appearance and colon obstruction. The mass also involves the left ureter causing an element of left ureteral dilatation worrisome for an element of left ureteral obstruction. The gas and fluid collection is very small measuring 1.8 x 1.2 cm on image 109 of series 4. Overall mass size including the involved colon is 5.7 x 5.3 cm. Normal appendix. Lymphatic: Several left iliac and left lower quadrant mesenteric lymph nodes are identified. The largest on image 92 measures 0.9 cm in short axis diameter. Reproductive: Post hysterectomy. The right ovary is not  identified. A mass is present in the left adnexa as described above. Other: Noncontributory. Musculoskeletal: No vertebral compression. IMPRESSION: VASCULAR Atherosclerotic changes without obstruction. NON-VASCULAR There is a mass in the left hemipelvis involving the sigmoid colon and left pelvic soft tissues resulting in colon obstruction and left ureteral obstruction. The mass does contain a focal gas and fluid collection consistent with perforation and abscess formation. It is small measuring up to 1.8 cm. Cholelithiasis. Electronically Signed   By: AMarybelle KillingsM.D.   On: 08/18/2017 15:12   ECHOCARDIOGRAM ------------------------------------------------------------------- Study Conclusions  - Left ventricle: The cavity size was normal. Wall thickness was   increased in a pattern of mild LVH. Systolic function was   vigorous. The estimated ejection fraction was in the range of 65%   to 70%. Wall motion was normal; there were no  regional wall   motion abnormalities. Left ventricular diastolic function   parameters were normal. - Aortic valve: Valve mobility was restricted. - Mitral valve: Moderately calcified annulus. - Left atrium: The atrium was moderately dilated. - Right atrium: The atrium was mildly dilated. - Tricuspid valve: There was severe regurgitation.  LE VENOUS DUPLEX Left lower extremity venous duplex has been completed. Negative for obvious evidence of DVT.  Subjective: Seen and examined and was feeling better. States she wishes she had a better appetite. No CP or SOB. Having stools and tolerating diet well. Feels weak but wanting to go to rehab now.  Discharge Exam: Vitals:   08/25/17 2111 08/26/17 0403  BP: (!) 151/54 (!) 154/88  Pulse: 73 87  Resp: 18 18  Temp: 98.4 F (36.9 C) 98.2 F (36.8 C)  SpO2: 96% 100%   Vitals:   08/25/17 1319 08/25/17 1456 08/25/17 2111 08/26/17 0403  BP: (!) 180/63 140/78 (!) 151/54 (!) 154/88  Pulse: 79 85 73 87  Resp: 18  18  18   Temp: 98.1 F (36.7 C)  98.4 F (36.9 C) 98.2 F (36.8 C)  TempSrc: Oral  Oral Oral  SpO2: 98% 98% 96% 100%  Weight:      Height:       General: Pt is alert, awake, not in acute distress Cardiovascular: RRR, Loud 3/6 Systolic Murmur. S1/S2 +, no rubs, no gallops Respiratory: Diminished bilaterally, no wheezing, no rhonchi Abdominal: Soft, Mildly tender, ND, bowel sounds +; Ostomy bag in place with brown liquid stool Extremities: no edema, no cyanosis  The results of significant diagnostics from this hospitalization (including imaging, microbiology, ancillary and laboratory) are listed below for reference.    Microbiology: Recent Results (from the past 240 hour(s))  MRSA PCR Screening     Status: None   Collection Time: 08/18/17  7:43 PM  Result Value Ref Range Status   MRSA by PCR NEGATIVE NEGATIVE Final    Comment:        The GeneXpert MRSA Assay (FDA approved for NASAL specimens only), is one component of a comprehensive MRSA colonization surveillance program. It is not intended to diagnose MRSA infection nor to guide or monitor treatment for MRSA infections. Performed at [facility], [place] [address] [place], [place] [address]   Culture, blood (routine x 2)     Status: None   Collection Time: 08/18/17  7:54 PM  Result Value Ref Range Status   Specimen Description   Final    BLOOD RIGHT ANTECUBITAL Performed at [facility] [address] [place], [place] [address]    Special Requests   Final    BOTTLES DRAWN AEROBIC AND ANAEROBIC Blood Culture adequate volume Performed at [facility] [address], [place], [place] [address]    Culture   Final    NO GROWTH 5 DAYS Performed at [facility], [place] [address] [place], [place] [address]    Report Status 08/23/2017 FINAL  Final  Culture, blood (routine x 2)     Status: None   Collection Time: 08/18/17  7:54 PM  Result Value Ref Range Status    Specimen Description   Final    BLOOD RIGHT FOREARM Performed at [facility] [address], [place], [place] [address]    Special Requests   Final    BOTTLES DRAWN AEROBIC ONLY Blood Culture results may not be optimal due to an inadequate volume of blood received in culture bottles  Performed at [facility], [place] [address], [place], [place] [address]    Culture   Final    NO GROWTH 5 DAYS Performed at [facility], [place] [address], [place], [place] [address]    Report Status 08/23/2017 FINAL  Final    Labs: BNP (last 3 results) No results for input(s): BNP in the last 8760 hours. Basic Metabolic Panel: Recent Labs  Lab 08/19/17 1655  08/22/17 0348 08/23/17 0532 08/24/17 0608 08/25/17 1028 08/26/17 0359  NA 132*   < > 133* 134* 134* 136 136  K 3.2*   < > 4.8 3.4* 3.6 3.3* 3.8  CL 100*   < > 99* 99* 97* 97* 99*  CO2 23   < > 24 28 27 27 29   GLUCOSE 91   < > 98 111* 98 108* 98  BUN <5*   < > 6 5* <5* <5* <5*  CREATININE 0.58   < > 0.62 0.60 0.54 0.44 0.49  CALCIUM 7.4*   < > 6.2* 8.3* 8.7* 8.8* 8.7*  MG 1.8   < > 1.2* 1.8 1.7 1.4* 1.8  PHOS 1.9*  --   --   --   --  2.1* 3.4   < > = values in this interval not displayed.   Liver Function Tests: Recent Labs  Lab 08/25/17 1028 08/26/17 0359  AST 17 20  ALT 9* 11*  ALKPHOS 64 64  BILITOT 0.9 0.5  PROT 5.9* 6.0*  ALBUMIN 2.5* 2.4*   No results for input(s): LIPASE, AMYLASE in the last 168 hours. No results for input(s): AMMONIA in the last 168 hours. CBC: Recent Labs  Lab 08/21/17 0322  08/22/17 0348 08/23/17 0532 08/24/17 0608 08/25/17 1028 08/26/17 0359  WBC 12.0*  --  12.2* 9.2 7.8 7.3 6.9  NEUTROABS 10.6*  --  10.5*  --  6.2 6.1 4.8  HGB 8.5*   < > 11.5* 10.9* 11.5* 11.2* 11.0*  HCT 26.1*   < > 35.4* 33.3* 36.1 35.7* 33.9*  MCV 82.6  --  83.3 82.8 85.3 85.6 84.3  PLT 340  --  358 286 321 342 333   < > = values in this interval not displayed.   Cardiac  Enzymes: Recent Labs  Lab 08/19/17 1241 08/19/17 1655 08/20/17 0015 08/20/17 0435  TROPONINI 0.77* 0.56* 0.45* 0.28*   BNP: Invalid input(s): POCBNP CBG: No results for input(s): GLUCAP in the last 168 hours. D-Dimer No results for input(s): DDIMER in the last 72 hours. Hgb A1c No results for input(s): HGBA1C in the last 72 hours. Lipid Profile No results for input(s): CHOL, HDL, LDLCALC, TRIG, CHOLHDL, LDLDIRECT in the last 72 hours. Thyroid function studies No results for input(s): TSH, T4TOTAL, T3FREE, THYROIDAB in the last 72 hours.  Invalid input(s): FREET3 Anemia work up No results for input(s): VITAMINB12, FOLATE, FERRITIN, TIBC, IRON, RETICCTPCT in the last 72 hours. Urinalysis    Component Value Date/Time   COLORURINE YELLOW 08/18/2017 1410   APPEARANCEUR CLEAR 08/18/2017 1410   LABSPEC 1.006 08/18/2017 1410   LABSPEC 1.005 04/14/2016 1230   PHURINE 7.0 08/18/2017 1410   GLUCOSEU NEGATIVE 08/18/2017 1410   GLUCOSEU Negative 04/14/2016 1230   HGBUR NEGATIVE 08/18/2017 1410   BILIRUBINUR NEGATIVE 08/18/2017 1410   BILIRUBINUR Negative 04/14/2016 1230   KETONESUR 20 (A) 08/18/2017 1410   PROTEINUR NEGATIVE 08/18/2017 1410   UROBILINOGEN 0.2 04/14/2016 1230   NITRITE NEGATIVE 08/18/2017 1410   LEUKOCYTESUR NEGATIVE 08/18/2017 1410  LEUKOCYTESUR Trace 04/14/2016 1230   Sepsis Labs Invalid input(s): PROCALCITONIN,  WBC,  LACTICIDVEN Microbiology Recent Results (from the past 240 hour(s))  MRSA PCR Screening     Status: None   Collection Time: 08/18/17  7:43 PM  Result Value Ref Range Status   MRSA by PCR NEGATIVE NEGATIVE Final    Comment:        The GeneXpert MRSA Assay (FDA approved for NASAL specimens only), is one component of a comprehensive MRSA colonization surveillance program. It is not intended to diagnose MRSA infection nor to guide or monitor treatment for MRSA infections. Performed at [facility], [place] [address] [place], [place] [address]   Culture, blood (routine x 2)     Status: None   Collection Time: 08/18/17  7:54 PM  Result Value Ref Range Status   Specimen Description   Final    BLOOD RIGHT ANTECUBITAL Performed at [facility] [address], [place], [place] [address]    Special Requests   Final    BOTTLES DRAWN AEROBIC AND ANAEROBIC Blood Culture adequate volume Performed at [facility] [address], [place], [place] [address]    Culture   Final    NO GROWTH 5 DAYS Performed at [facility], [place] [address] [place], [place] [address]    Report Status 08/23/2017 FINAL  Final  Culture, blood (routine x 2)     Status: None   Collection Time: 08/18/17  7:54 PM  Result Value Ref Range Status   Specimen Description   Final    BLOOD RIGHT FOREARM Performed at [facility] [address], [place], [place] [address]    Special Requests   Final    BOTTLES DRAWN AEROBIC ONLY Blood Culture results may not be optimal due to an inadequate volume of blood received in culture bottles Performed at [facility] [address], [place], [place] [address]    Culture   Final    NO GROWTH 5 DAYS Performed at [facility], [place] [address], [place], [place] [address]    Report Status 08/23/2017 FINAL  Final   Time coordinating discharge: 35 minutes  SIGNED:  Kerney Elbe, DO [facility] 08/26/2017, 11:57 AM Pager [phone]  If 7PM-7AM, please contact night-coverage [email or web address] Password [email or web address]

## 2017-08-24 NOTE — Progress Notes (Signed)
PROGRESS NOTE    Shannon Scott  YQM:[id number] DOB: [birth year] DOA: 08/18/2017 PCP: [name], MD  Brief Narrative:  Shannon Frenkel Pattersonis a 77 year old female with medical history significant for coronary artery disease status post CABG, endometrial cancer status post total hysterectomy, hypothyroidism who presented to the emergency department complaining of generalized abdominal pain nausea and vomiting. CT scan of the abdomen and pelvis showed heterogeneous ill defined mass involving the sigmoid colon in the left pelvic with suspicious perforation and abscess formation. Patient also was found to be on new onset A. fib with RVR. Patient was admitted with working diagnosis of colon mass complicated with A. fib with RVR. General surgery and cardiology has been consulted. On CT was noted that the mass was compressing left ureter causing dilation therefore Urology was consulted and patient underwent JJ stenting and Laparoscopic Diverting Loop Transverse Colostomy on 08/21/17. PT evaluated and recommending SNF but patient refusing.   Assessment & Plan:   Principal Problem:   Colonic mass Active Problems:   Hypothyroid   Essential hypertension   S/P CABG x 3   Hyponatremia   Acute hypokalemia   Bowel perforation (HCC)   Ureteral dilatation   New onset atrial fibrillation (HCC)  Colon mass -Likely malignant -awaiting pathology -S/p laparoscopic diverting loop transverse colostomy - 08/21/17 -Patient with history of endometrial cancer 2 years ago with total hysterectomy. -Continue management per surgical team -Diet being advanced by General Surgery to Soft Diet today -Ostomy bar to be removed today; [facility] nurse consulted   Bowel perforation/abscess -CT scan report focal gas and fluid collection consistent with bowel perforation. -Patient was on Zosyn and able to tolerate some clears so was switched Augmentin for total of 10 days -Blood cultures so far negative, patient remains  afebrile, continue supportive treatment  Ureteral dilation/mild hydronephrosis likely from Colonic Mass -Status post cystoscopy, left retrograde pyelogram with left JJ stent placement -D/C'd Foley,hematuria likely due to stent irritation of the bladder -Urology will arrange outpatient follow-up in 3 months  PAFwith RVR -new onset -Patient remains in normal sinus rhythms, heart rate well controlled -C/w Diltiazem 240 mg po Daily; IV Amioadarone  -No anticoagulation at this moment given transient episode in setting of acute bowel process. -CHADSVASc 4 -if A. fib recur she might need anticoagulation in the future -Echocardiogram LVEF 65-70%, mild LVH pattern, no regional wall abnormality. -Appreciated Management per Cardiology Recommendations  Hypertension -Blood pressure acceptable -C/w Irbesartan and Diltiazem  -C/w IV Hydralazine 10 mg IV q6hprn SBP>160 or DBP>100  Hypokalemia -Replete; K+ now 3.6 -Continue to Monitor and Replete as Necessary -Repeat CMP in AM  Hypomagnesemia -Resolved. Patient's Mag was 1.7 this AM -Repeat Mag in AM   Anemia likely of chronic disease -Multifactorial, hemodilution, colonic mass and postop  -She was transfused 2 units of PRBCs during surgery -Hemoglobin remained stable since transfusion and is now 11.5/36.1 -No signs of overt bleeding, continue to monitor -Transfuse if hemoglobin less than 8 given CAD  Elevated Troponin/CAD s/p CABG -Likley Demand Ischemia due to A Fib RVR -ECHO As below -Cardiology recommending considering NST as an outpatient after resolution of current situation  Hyponatremia -Mild at 134 -Continue to Monitor   Hypothyroidism -C/w IV Levothyroxine 25 mcg IV Daily -Change to po in AM  DVT prophylaxis: Enoxaparin 40 mg sq q24h Code Status: FULL CODE  Family Communication: No family present at bedside  Disposition Plan: Transfer to General Medical Floor with Telemetry and Anticipate D/C in Next 24-48  hours; PT  Recommending SNF but patient refusing   Consultants:   General Surgery  Urology  Gastroenterology   Cardiology  [place] Nurse   Procedures:    Antimicrobials:  Anti-infectives (From admission, onward)   Start     Dose/Rate Route Frequency Ordered Stop   08/23/17 1000  amoxicillin-clavulanate (AUGMENTIN) 875-125 MG per tablet 1 tablet     1 tablet Oral Every 12 hours 08/23/17 0858 09/02/17 0959   08/21/17 0910  piperacillin-tazobactam (ZOSYN) 3.375 GM/50ML IVPB    Note to Pharmacy:  Scott, Shannon: cabinet override      08/21/17 0910 08/21/17 1014   08/19/17 0000  piperacillin-tazobactam (ZOSYN) IVPB 3.375 g  Status:  Discontinued     3.375 g 12.5 mL/hr over 240 Minutes Intravenous Every 8 hours 08/18/17 1703 08/23/17 0858   08/18/17 1545  piperacillin-tazobactam (ZOSYN) IVPB 3.375 g     3.375 g 100 mL/hr over 30 Minutes Intravenous  Once 08/18/17 1540 08/18/17 1707     Subjective: Seen and examined and was improving but was tearful because she did not want to go to SNF. No CP or SOB. No lightheadedness or dizziness. Still complaining of Left Sided Hip Pain.   Objective: Vitals:   08/24/17 1116 08/24/17 1200 08/24/17 1600 08/24/17 1805  BP: (!) 121/40 (!) 131/44 (!) 157/47 (!) 152/67  Pulse: 70 73 72 82  Resp: 18 16 18 20   Temp:  98.3 F (36.8 C) 98.6 F (37 C) 98.3 F (36.8 C)  TempSrc:  Oral Oral Oral  SpO2: 94% 96% 95% 97%  Weight:      Height:        Intake/Output Summary (Last 24 hours) at 08/24/2017 1847 Last data filed at 08/24/2017 0800 Gross per 24 hour  Intake 825 ml  Output 375 ml  Net 450 ml   Filed Weights   08/22/17 0000  Weight: 69.4 kg (153 lb)   Examination: Physical Exam:  Constitutional: Thin Caucasian female in NAD and appears calm and comfortable Eyes:  Lids and conjunctivae normal, sclerae anicteric  ENMT: External Ears, Nose appear normal. Grossly normal hearing. Mucous membranes are moist.  Neck: Appears normal,  supple, no cervical masses, normal ROM, no appreciable thyromegaly, no JVD Respiratory: Diminished to auscultation bilaterally, no wheezing, rales, rhonchi or crackles. Normal respiratory effort and patient is not tachypenic. No accessory muscle use.  Cardiovascular: RRR, no murmurs / rubs / gallops. S1 and S2 auscultated. No extremity edema. Abdomen: Soft, non-tender, non-distended. No masses palpated. No appreciable hepatosplenomegaly. Bowel sounds positive. Has a Colostomy with brown stool.  GU: Deferred. Musculoskeletal: No clubbing / cyanosis of digits/nails. No joint deformity upper and lower extremities.   Skin: No rashes, lesions, ulcers on a limited skin eval. No induration; Warm and dry.  Neurologic: CN 2-12 grossly intact with no focal deficits. Sensation intact in all 4 Extremities. Psychiatric: Normal judgment and insight. Alert and oriented x 3. Tearful and anxious mood and appropriate affect.   Data Reviewed: I have personally reviewed following labs and imaging studies  CBC: Recent Labs  Lab 08/20/17 0435 08/21/17 0322 08/21/17 0923 08/22/17 0348 08/23/17 0532 08/24/17 0608  WBC 11.4* 12.0*  --  12.2* 9.2 7.8  NEUTROABS 10.0* 10.6*  --  10.5*  --  6.2  HGB 9.1* 8.5* 10.5* 11.5* 10.9* 11.5*  HCT 28.6* 26.1* 31.0* 35.4* 33.3* 36.1  MCV 83.4 82.6  --  83.3 82.8 85.3  PLT 384 340  --  358 286 401   Basic Metabolic Panel: Recent Labs  Lab 08/19/17 1655 08/20/17 0435 08/21/17 0322 08/21/17 5732 08/22/17 0348 08/23/17 0532 08/24/17 0608  NA 132* 131* 128* 130* 133* 134* 134*  K 3.2* 3.3* 3.3* 3.5 4.8 3.4* 3.6  CL 100* 98* 94*  --  99* 99* 97*  CO2 23 23 23   --  24 28 27   GLUCOSE 91 80 167*  --  98 111* 98  BUN <5* <5* <5*  --  6 5* <5*  CREATININE 0.58 0.63 0.64  --  0.62 0.60 0.54  CALCIUM 7.4* 8.2* 8.2*  --  6.2* 8.3* 8.7*  MG 1.8 1.9 1.5*  --  1.2* 1.8 1.7  PHOS 1.9*  --   --   --   --   --   --    GFR: Estimated Creatinine Clearance: 54.1 mL/min (by C-G  formula based on SCr of 0.54 mg/dL). Liver Function Tests: Recent Labs  Lab 08/18/17 1142 08/19/17 0337  AST 28 23  ALT 12* 11*  ALKPHOS 100 79  BILITOT 0.4 0.6  PROT 7.9 6.1*  ALBUMIN 3.8 2.9*   Recent Labs  Lab 08/18/17 1142  LIPASE 21   No results for input(s): AMMONIA in the last 168 hours. Coagulation Profile: No results for input(s): INR, PROTIME in the last 168 hours. Cardiac Enzymes: Recent Labs  Lab 08/19/17 1241 08/19/17 1655 08/20/17 0015 08/20/17 0435  TROPONINI 0.77* 0.56* 0.45* 0.28*   BNP (last 3 results) No results for input(s): PROBNP in the last 8760 hours. HbA1C: No results for input(s): HGBA1C in the last 72 hours. CBG: No results for input(s): GLUCAP in the last 168 hours. Lipid Profile: No results for input(s): CHOL, HDL, LDLCALC, TRIG, CHOLHDL, LDLDIRECT in the last 72 hours. Thyroid Function Tests: No results for input(s): TSH, T4TOTAL, FREET4, T3FREE, THYROIDAB in the last 72 hours. Anemia Panel: No results for input(s): VITAMINB12, FOLATE, FERRITIN, TIBC, IRON, RETICCTPCT in the last 72 hours. Sepsis Labs: Recent Labs  Lab 08/18/17 1317 08/18/17 1652  LATICACIDVEN 0.97 0.97    Recent Results (from the past 240 hour(s))  MRSA PCR Screening     Status: None   Collection Time: 08/18/17  7:43 PM  Result Value Ref Range Status   MRSA by PCR NEGATIVE NEGATIVE Final    Comment:        The GeneXpert MRSA Assay (FDA approved for NASAL specimens only), is one component of a comprehensive MRSA colonization surveillance program. It is not intended to diagnose MRSA infection nor to guide or monitor treatment for MRSA infections. Performed at [facility], [place] [address] [place], [place] [address]   Culture, blood (routine x 2)     Status: None   Collection Time: 08/18/17  7:54 PM  Result Value Ref Range Status   Specimen Description   Final    BLOOD RIGHT ANTECUBITAL Performed at [facility] [address] [place], [place] [address]    Special Requests   Final    BOTTLES DRAWN AEROBIC AND ANAEROBIC Blood Culture adequate volume Performed at [facility] [address], [place], [place] [address]    Culture   Final    NO GROWTH 5 DAYS Performed at [facility], [place] [address], [place], [place] [address]    Report Status 08/23/2017 FINAL  Final  Culture, blood (routine x 2)     Status: None   Collection Time: 08/18/17  7:54 PM  Result Value Ref Range Status   Specimen Description  Final    BLOOD RIGHT FOREARM Performed at [facility] [address], [place], [place] [address]    Special Requests   Final    BOTTLES DRAWN AEROBIC ONLY Blood Culture results may not be optimal due to an inadequate volume of blood received in culture bottles Performed at [facility] [address] [place], [place] [address]    Culture   Final    NO GROWTH 5 DAYS Performed at [facility], [place] [address], [place], [place] [address]    Report Status 08/23/2017 FINAL  Final    Radiology Studies: No results found.  Scheduled Meds: . amoxicillin-clavulanate  1 tablet Oral Q12H  . Chlorhexidine Gluconate Cloth  6 each Topical Daily  . diclofenac sodium  2 g Topical QID  . diltiazem  240 mg Oral Daily  . enoxaparin (LOVENOX) injection  40 mg Subcutaneous Q24H  . feeding supplement  1 Container Oral TID BM  . irbesartan  300 mg Oral Daily  . levothyroxine  25 mcg Intravenous Daily  . pantoprazole (PROTONIX) IV  40 mg Intravenous Q24H  . sodium chloride flush  10-40 mL Intracatheter Q12H   Continuous Infusions: . lactated ringers 75 mL/hr at 08/24/17 1640    LOS: 6 days   Kerney Elbe, DO [facility] Pager [phone]  If 7PM-7AM, please contact night-coverage [email or web address] Password [facility] 08/24/2017, 6:47 PM

## 2017-08-25 ENCOUNTER — Inpatient Hospital Stay (HOSPITAL_COMMUNITY): Payer: Medicare Other

## 2017-08-25 DIAGNOSIS — M7989 Other specified soft tissue disorders: Secondary | ICD-10-CM

## 2017-08-25 DIAGNOSIS — R9431 Abnormal electrocardiogram [ECG] [EKG]: Secondary | ICD-10-CM

## 2017-08-25 LAB — CBC WITH DIFFERENTIAL/PLATELET
Basophils Absolute: 0 10*3/uL (ref 0.0–0.1)
Basophils Relative: 0 %
Eosinophils Absolute: 0.2 10*3/uL (ref 0.0–0.7)
Eosinophils Relative: 3 %
HCT: 35.7 % — ABNORMAL LOW (ref 36.0–46.0)
Hemoglobin: 11.2 g/dL — ABNORMAL LOW (ref 12.0–15.0)
Lymphocytes Relative: 6 %
Lymphs Abs: 0.4 10*3/uL — ABNORMAL LOW (ref 0.7–4.0)
MCH: 26.9 pg (ref 26.0–34.0)
MCHC: 31.4 g/dL (ref 30.0–36.0)
MCV: 85.6 fL (ref 78.0–100.0)
Monocytes Absolute: 0.5 10*3/uL (ref 0.1–1.0)
Monocytes Relative: 7 %
Neutro Abs: 6.1 10*3/uL (ref 1.7–7.7)
Neutrophils Relative %: 84 %
Platelets: 342 10*3/uL (ref 150–400)
RBC: 4.17 MIL/uL (ref 3.87–5.11)
RDW: 15.8 % — ABNORMAL HIGH (ref 11.5–15.5)
WBC: 7.3 10*3/uL (ref 4.0–10.5)

## 2017-08-25 LAB — BPAM RBC
Blood Product Expiration Date: 201904162359
Blood Product Expiration Date: 201904162359
Blood Product Expiration Date: 201904162359
Blood Product Expiration Date: 201904232359
ISSUE DATE / TIME: 201903240739
ISSUE DATE / TIME: 201903240739
ISSUE DATE / TIME: 201903240824
Unit Type and Rh: 5100
Unit Type and Rh: 5100
Unit Type and Rh: 5100
Unit Type and Rh: 5100

## 2017-08-25 LAB — COMPREHENSIVE METABOLIC PANEL
ALT: 9 U/L — ABNORMAL LOW (ref 14–54)
AST: 17 U/L (ref 15–41)
Albumin: 2.5 g/dL — ABNORMAL LOW (ref 3.5–5.0)
Alkaline Phosphatase: 64 U/L (ref 38–126)
Anion gap: 12 (ref 5–15)
BUN: <5 mg/dL — ABNORMAL LOW (ref 6–20)
CO2: 27 mmol/L (ref 22–32)
Calcium: 8.8 mg/dL — ABNORMAL LOW (ref 8.9–10.3)
Chloride: 97 mmol/L — ABNORMAL LOW (ref 101–111)
Creatinine, Ser: 0.44 mg/dL (ref 0.44–1.00)
GFR calc Af Amer: >60 mL/min (ref >60)
GFR calc non Af Amer: >60 mL/min (ref >60)
Glucose, Bld: 108 mg/dL — ABNORMAL HIGH (ref 65–99)
Potassium: 3.3 mmol/L — ABNORMAL LOW (ref 3.5–5.1)
Sodium: 136 mmol/L (ref 135–145)
Total Bilirubin: 0.9 mg/dL (ref 0.3–1.2)
Total Protein: 5.9 g/dL — ABNORMAL LOW (ref 6.5–8.1)

## 2017-08-25 LAB — TYPE AND SCREEN
ABO/RH(D): O POS
Antibody Screen: NEGATIVE
Unit division: 0
Unit division: 0
Unit division: 0
Unit division: 0

## 2017-08-25 LAB — PHOSPHORUS: Phosphorus: 2.1 mg/dL — ABNORMAL LOW (ref 2.5–4.6)

## 2017-08-25 LAB — MAGNESIUM: Magnesium: 1.4 mg/dL — ABNORMAL LOW (ref 1.7–2.4)

## 2017-08-25 MED ORDER — MAGNESIUM SULFATE 2 GM/50ML IV SOLN
2.0000 g | Freq: Once | INTRAVENOUS | Status: AC
  Administered 2017-08-25: 2 g via INTRAVENOUS
  Filled 2017-08-25: qty 50

## 2017-08-25 MED ORDER — POTASSIUM PHOSPHATES 15 MMOLE/5ML IV SOLN
20.0000 mmol | Freq: Once | INTRAVENOUS | Status: AC
  Administered 2017-08-25: 20 mmol via INTRAVENOUS
  Filled 2017-08-25: qty 6.67

## 2017-08-25 MED ORDER — LEVOTHYROXINE SODIUM 50 MCG PO TABS
50.0000 ug | ORAL_TABLET | Freq: Every day | ORAL | Status: DC
  Administered 2017-08-26: 50 ug via ORAL
  Filled 2017-08-25: qty 1

## 2017-08-25 MED ORDER — ACETAMINOPHEN 325 MG PO TABS
650.0000 mg | ORAL_TABLET | Freq: Four times a day (QID) | ORAL | Status: DC | PRN
  Administered 2017-08-25: 650 mg via ORAL
  Filled 2017-08-25 (×2): qty 2

## 2017-08-25 MED ORDER — PANTOPRAZOLE SODIUM 40 MG PO TBEC
40.0000 mg | DELAYED_RELEASE_TABLET | Freq: Every day | ORAL | Status: DC
  Administered 2017-08-25 – 2017-08-26 (×2): 40 mg via ORAL
  Filled 2017-08-25 (×2): qty 1

## 2017-08-25 MED ORDER — ENSURE ENLIVE PO LIQD
237.0000 mL | Freq: Two times a day (BID) | ORAL | Status: DC
  Administered 2017-08-25 – 2017-08-26 (×3): 237 mL via ORAL

## 2017-08-25 MED ORDER — POTASSIUM CHLORIDE CRYS ER 20 MEQ PO TBCR
40.0000 meq | EXTENDED_RELEASE_TABLET | Freq: Two times a day (BID) | ORAL | Status: DC
  Administered 2017-08-25 – 2017-08-26 (×2): 40 meq via ORAL
  Filled 2017-08-25 (×2): qty 2

## 2017-08-25 NOTE — Progress Notes (Signed)
PHARMACIST - PHYSICIAN COMMUNICATION  DR:   Alfredia Ferguson  CONCERNING: IV to Oral Route Change Policy  RECOMMENDATION: This patient is receiving Protonix by the intravenous route.  Based on criteria approved by the Pharmacy and Therapeutics Committee, the intravenous medication(s) is/are being converted to the equivalent oral dose form(s).   DESCRIPTION: These criteria include:  The patient is eating (either orally or via tube) and/or has been taking other orally administered medications for a least 24 hours  The patient has no evidence of active gastrointestinal bleeding or impaired GI absorption (gastrectomy, short bowel, patient on TNA or NPO).  If you have questions about this conversion, please contact the Pharmacy Department  []   [phone] )  [facility] []   [phone] )  [facility] []   [phone] )  [facility] []   [phone] )  [facility] [x]   [phone] )  [facility]   08/25/2017 1:17 PM  Peggyann Juba, PharmD, BCPS Pager: [phone]

## 2017-08-25 NOTE — Consult Note (Signed)
[place] Nurse ostomy follow up Stoma type/location: Colostomy stoma red and viable, edematous and shiny, above skin level.  Removed sutures and rod from stoma as ordered by surgical team. Stomal assessment/size:  2 inches Peristomal assessment: Red irritated areas where 4 sutures were removed. Output: Mod amt brown unformed stool  Ostomy pouching: 1pc.  Education provided: Demonstrated pouch change using one piece flat pouch.  Pt states she is familiar with pouch application and emptying since she used to be a Marine scientist. She is able to open and close the velcro.  Reviewed pouching routines and ordering supplies.  She asked appropriate questions.  Supplies at the bedside for staff nurses use. Enrolled patient in [place] Start Discharge program: Yes Julien Girt MSN, RN, [place], [place], [place]

## 2017-08-25 NOTE — Progress Notes (Addendum)
Physical Therapy Treatment Patient Details Name: Shannon Scott MRN: [id number] DOB: [birth year] Today's Date: 08/25/2017    History of Present Illness 77 yo female admitted 08/18/17 for Obstructing left colon mass with probable perforation and abscess,  s/p laparoscopic diverting loop transverse colostomy 08/21/17, new onset a fib.    PT Comments    Pt is progressing although only able to walk about 42' with RW and min assist, fatigues easily; continue to recommend SNF post acute as pt lives alone and  would not be able to manage alone at home at her current status; pt reports to PT that she is agreeable to SNF; will follow    Follow Up Recommendations  SNF     Equipment Recommendations  Rolling walker with 5" wheels(or defer to SNF)    Recommendations for Other Services       Precautions / Restrictions Precautions Precautions: Fall Precaution Comments: colostomy, recent afib Restrictions Weight Bearing Restrictions: No    Mobility  Bed Mobility   Bed Mobility: Supine to Sit     Supine to sit: Min guard;HOB elevated     General bed mobility comments: extra time to  move legs, min/guard for  trunk  Transfers Overall transfer level: Needs assistance Equipment used: Rolling walker (2 wheeled) Transfers: Sit to/from Stand Sit to Stand: Min assist         General transfer comment: steady assist to rise from bed, cues for safety, hand placement  Ambulation/Gait Ambulation/Gait assistance: Min guard;Min assist Ambulation Distance (Feet): 45 Feet Assistive device: Rolling walker (2 wheeled)   O2 sats 97-100% on RA          Stairs            Wheelchair Mobility    Modified Rankin (Stroke Patients Only)       Balance Overall balance assessment: Needs assistance Sitting-balance support: No upper extremity supported;Feet supported Sitting balance-Leahy Scale: Fair     Standing balance support: Bilateral upper extremity supported;During  functional activity Standing balance-Leahy Scale: Poor Standing balance comment: reliant on UE support                            Cognition Arousal/Alertness: Awake/alert Behavior During Therapy: WFL for tasks assessed/performed Overall Cognitive Status: Within Functional Limits for tasks assessed                                        Exercises      General Comments        Pertinent Vitals/Pain Pain Assessment: Faces Faces Pain Scale: Hurts little more Pain Location: l hip - bursitis per pt Pain Descriptors / Indicators: Discomfort;Grimacing Pain Intervention(s): Monitored during session    Home Living                      Prior Function            PT Goals (current goals can now be found in the care plan section) Acute Rehab PT Goals Patient Stated Goal: I want to go home, but will go to rehab if I have to PT Goal Formulation: With patient Time For Goal Achievement: 09/06/17 Potential to Achieve Goals: Good Progress towards PT goals: Progressing toward goals    Frequency    Min 2X/week      PT Plan Current plan remains appropriate  Co-evaluation              AM-PAC PT "6 Clicks" Daily Activity  Outcome Measure  Difficulty turning over in bed (including adjusting bedclothes, sheets and blankets)?: A Little Difficulty moving from lying on back to sitting on the side of the bed? : Unable Difficulty sitting down on and standing up from a chair with arms (e.g., wheelchair, bedside commode, etc,.)?: Unable Help needed moving to and from a bed to chair (including a wheelchair)?: A Little Help needed walking in hospital room?: A Little Help needed climbing 3-5 steps with a railing? : A Lot 6 Click Score: 13    End of Session Equipment Utilized During Treatment: Gait belt Activity Tolerance: Patient tolerated treatment well Patient left: in chair;with call bell/phone within reach;with chair alarm set Nurse  Communication: Mobility status PT Visit Diagnosis: Unsteadiness on feet (R26.81)     Time: 1228-1300 PT Time Calculation (min) (ACUTE ONLY): 32 min  Charges:  $Gait Training: 23-37 mins                    G CodesKenyon Ana, PT Pager: [phone] 08/25/2017    Kenyon Ana 08/25/2017, 1:15 PM

## 2017-08-25 NOTE — Progress Notes (Signed)
Left lower extremity venous duplex has been completed. Negative for obvious evidence of DVT.  08/25/17 10:09 AM Carlos Levering RVT

## 2017-08-25 NOTE — Progress Notes (Signed)
Initial Nutrition Assessment  DOCUMENTATION CODES:   Not applicable  INTERVENTION:   Ensure Enlive po BID, each supplement provides 350 kcal and 20 grams of protein  Monitor magnesium, potassium, and phosphorus daily for at least 3 days, MD to replete as needed, as pt is at risk for refeeding syndrome.   NUTRITION DIAGNOSIS:   Inadequate oral intake related to altered GI function, decreased appetite as evidenced by meal completion < 50%.  GOAL:   Patient will meet greater than or equal to 90% of their needs  MONITOR:   PO intake, Supplement acceptance, Diet advancement, I & O's, Labs, Weight trends  REASON FOR ASSESSMENT:   Consult Assessment of nutrition requirement/status  ASSESSMENT:   Patient with PMH significant for CAD s/p CABG, endometrial cancer s/p total hysterectomy, and hypothyroidism.Presents this admission with progressive abdominal pain, distention, nausea since last several weeks. Admitted for colonic mass and bowel perforation/abscess.    3/24- diverting loop colostomy, left retrograde pyelogram with left JJ stent placement 3/25- clear liquid diet 3/27- soft diet  Attempted to speak with pt at bedside, pt upset about not receiving pain medication in a timely manor. Tried to talk about intake prior to admission, but pt was unwilling. Pt had 50% of bacon, eggs, toast, and a banana for breakfast this morning without complication. Pt complains that food from the cafeteria has no taste and causes her to eat less. Spoke with nurse who reports pt refused all clear supplementation. RD to discontinue and try Ensure Chocolate.   Pt unable to provide weight history. Records indicate pt has maintained her weight of 145-155 lb for over a year. Do not suspect any recent weight loss. Nutrition-Focused physical exam completed.  Medications reviewed and include: LR @ 75 ml/hr Labs reviewed: K 3.3 (L) BUN <5 (L) Phosphorus 2.1 (L) Mg 1.4 (L)   NUTRITION - FOCUSED PHYSICAL  EXAM:    Most Recent Value  Orbital Region  No depletion  Upper Arm Region  No depletion  Thoracic and Lumbar Region  Unable to assess  Buccal Region  No depletion  Temple Region  No depletion  Clavicle Bone Region  No depletion  Clavicle and Acromion Bone Region  No depletion  Scapular Bone Region  Unable to assess  Dorsal Hand  No depletion  Patellar Region  No depletion  Anterior Thigh Region  No depletion  Posterior Calf Region  No depletion  Edema (RD Assessment)  None     Diet Order:  DIET SOFT Room service appropriate? Yes; Fluid consistency: Thin  EDUCATION NEEDS:   Not appropriate for education at this time  Skin:  Skin Assessment: Skin Integrity Issues: Skin Integrity Issues:: Incisions Incisions: closed incision abdomen  Last BM:  08/25/17- 75 ml ostomy  Height:   Ht Readings from Last 1 Encounters:  08/22/17 5\' 1"  (1.549 m)    Weight:   Wt Readings from Last 1 Encounters:  08/22/17 153 lb (69.4 kg)    Ideal Body Weight:  47.7 kg  BMI:  Body mass index is 28.91 kg/m.  Estimated Nutritional Needs:   Kcal:  1300-1500 kcal/day  Protein:  65-75 g/day  Fluid:  >1.3 L/day    Mariana Single RD, LDN Clinical Nutrition Pager # - [phone]

## 2017-08-25 NOTE — Progress Notes (Signed)
PROGRESS NOTE    Shannon Scott  WSF:[id number] DOB: [birth year] DOA: 08/18/2017 PCP: [name], MD  Brief Narrative:  Shannon Scott a 77 year old female with medical history significant for coronary artery disease status post CABG, endometrial cancer status post total hysterectomy, hypothyroidism who presented to the emergency department complaining of generalized abdominal pain nausea and vomiting. CT scan of the abdomen and pelvis showed heterogeneous ill defined mass involving the sigmoid colon in the left pelvic with suspicious perforation and abscess formation. Patient also was found to be on new onset A. fib with RVR. Patient was admitted with working diagnosis of colon mass complicated with A. fib with RVR. General surgery and cardiology has been consulted. On CT was noted that the mass was compressing left ureter causing dilation therefore Urology was consulted and patient underwent JJ stenting and Laparoscopic Diverting Loop Transverse Colostomy on 08/21/17. PT evaluated and recommending SNF and now she is agreeable.   Assessment & Plan:   Principal Problem:   Colonic mass Active Problems:   Hypothyroid   Essential hypertension   S/P CABG x 3   Hyponatremia   Acute hypokalemia   Bowel perforation (HCC)   Ureteral dilatation   New onset atrial fibrillation (HCC)  Colon mass -Likely malignant -Per my discussion with Surgery PA no Biopsy was take so will need to be ascertained by Gastroenterology upon Colonoscopy after Discharge to determine etiology of abdominal process that prompted surgery -S/p laparoscopic diverting loop transverse colostomy - 08/21/17 -Patient with history of endometrial cancer 2 years ago with total hysterectomy. -Continue management per surgical team -Diet being advanced by General Surgery and is on Soft Diet and tolerating it well -Nutritionist consulted and recommending Ensure Enlive po BID -Ostomy bar was removed today; [place] nurse  consulted -General Surgery recommending Follow up in 2-4 weeks and recommending repeat CT Scan to evaluate her Colon  Bowel perforation/Abscess -CT scan report focal gas and fluid collection consistent with bowel perforation. -Patient was on Zosyn and able to tolerate some clears so was switched Augmentin for total of 10 days and will continue -Blood cultures showed NGTD at 5 days, patient remains afebrile, continue supportive treatment  Ureteral dilation/mild hydronephrosis likely from Colonic Mass -Status post cystoscopy, left retrograde pyelogram with left JJ stent placement on 08/21/17 -D/C'd Foley,hematuria likely due to stent irritation of the bladder -Urology will arrange outpatient follow-up in 3 months  PAFwith RVR -new onset -Patient remains in normal sinus rhythm, heart rate well controlled now -C/w Diltiazem 240 mg po Daily; IV Amioadarone D/C'd -No anticoagulation at this moment given transient episode in setting of acute bowel process. -CHADSVASc 4 -if A. fib recur she might need anticoagulation in the future but per Cardiology no Anticoagulation for now given recent ostomy and self limiting time of Atrial Fibrillation -Echocardiogram LVEF 65-70%, mild LVH pattern, no regional wall abnormality. -Appreciated Management per Cardiology Recommendations  Hypertension -Blood pressure acceptable -C/w Irbesartan and Diltiazem  -C/w IV Hydralazine 10 mg IV q6hprn SBP>160 or DBP>100  Hypokalemia -Replete; K+ now 3.3 this AM -Replete with po KCl 40 mEQ BID and 20 mmol of KPhos IV -Continue to Monitor and Replete as Necessary -Repeat CMP in AM  Hypomagnesemia -Resolved. Patient's Mag was 1.4 this AM -Replete with IV Mag Sulfate 2 grams -Continue to Monitor and Replete as Necessary  -Repeat Mag in AM   Hypophosphatemia -Patient's Phos Level was 2.1 -Replete with IV KPhos 20 mmol -Continue to Monitor and Replete as Necessary -Repeat CMP in  AM   Anemia likely of  chronic disease -Multifactorial, hemodilution, colonic mass and postop  -She was transfused 2 units of PRBCs during surgery -Hemoglobin remained stable since transfusion and is now 11.2/35.7 -No signs of overt bleeding, continue to monitor -Transfuse if hemoglobin less than 8 given CAD  Elevated Troponin/CAD s/p CABG -Likley Demand Ischemia due to A Fib RVR -ECHO As below -Cardiology recommending considering NST as an outpatient after resolution of current situation  Hyponatremia, improved -Mild at 134 and now 136 -Continue to Monitor   Hypothyroidism -Given IV Levothyroxine 25 mcg IV today and changed dose tomorrow to po 50 mcg Daily   Left Leg Swelling -Patient's Left Leg was painful and more swollen than Right -Checked LE Duplex and was Negative for DVT   Left Hip Bursitis -Sees Orthopedics as an outpatient -Will need to follow up at D/C  DVT prophylaxis: Enoxaparin 40 mg sq q24h Code Status: FULL CODE  Family Communication: No family present at bedside  Disposition Plan: Anticipate D/C in Next 24-48 hours to SNF   Consultants:   General Surgery  Urology  Gastroenterology   Cardiology  [facility] Nurse   Procedures:  -S/p laparoscopic diverting loop transverse colostomy - 08/21/17  -Status post cystoscopy, left retrograde pyelogram with left JJ stent placement on 08/21/17   Antimicrobials:  Anti-infectives (From admission, onward)   Start     Dose/Rate Route Frequency Ordered Stop   08/23/17 1000  amoxicillin-clavulanate (AUGMENTIN) 875-125 MG per tablet 1 tablet     1 tablet Oral Every 12 hours 08/23/17 0858 09/02/17 0959   08/21/17 0910  piperacillin-tazobactam (ZOSYN) 3.375 GM/50ML IVPB    Note to Pharmacy:  [place]: cabinet override      08/21/17 0910 08/21/17 1014   08/19/17 0000  piperacillin-tazobactam (ZOSYN) IVPB 3.375 g  Status:  Discontinued     3.375 g 12.5 mL/hr over 240 Minutes Intravenous Every 8 hours 08/18/17 1703 08/23/17 0858     08/18/17 1545  piperacillin-tazobactam (ZOSYN) IVPB 3.375 g     3.375 g 100 mL/hr over 30 Minutes Intravenous  Once 08/18/17 1540 08/18/17 1707     Subjective: Seen and examined and now agreeable to SNF. Felt better and tolerating diet with no issues but not eating much. States she does not want to go to rehab until she can walk. No CP or SOB. Abdomen is slightly tender. No other complaints or concerns at this time.   Objective: Vitals:   08/25/17 0511 08/25/17 0939 08/25/17 1319 08/25/17 1456  BP: 125/64 (!) 181/81 (!) 180/63 140/78  Pulse: 64 81 79 85  Resp: 18 18 18    Temp: 97.6 F (36.4 C) 98.3 F (36.8 C) 98.1 F (36.7 C)   TempSrc: Oral Oral Oral   SpO2: 100% 97% 98% 98%  Weight:      Height:        Intake/Output Summary (Last 24 hours) at 08/25/2017 1913 Last data filed at 08/25/2017 1700 Gross per 24 hour  Intake 2745 ml  Output 176 ml  Net 2569 ml   Filed Weights   08/22/17 0000  Weight: 69.4 kg (153 lb)   Examination: Physical Exam:  Constitutional: Thin Caucasian female in NAD appears calm and resting comfortbaly  Eyes: Sclerae anicteric. Lids and Conjunctivae appear normal ENMT: External Ears and Nose appear normal. MMM Neck: Supple with no JVD; Respiratory: CTAB; No appreciable wheezing/rales/rhonchi Cardiovascular: RRR; has a 3/6 Systolic murmur. Left Leg is slightly more swollen than right.  Abdomen: Soft. Slightly  tender, ND. Bowel sounds present and has a Colostomy bag with no stool in it GU: Deferred Musculoskeletal: No contractures; No cyanosis Skin: Warm and Dry; Ostomy is C/D/I. No rashes or lesions on a limited skin evaluation Neurologic: CN 2-12 grossly intact. No appreciable focal deficits Psychiatric: Intact judgement and insight. Slightly agitated mood  Data Reviewed: I have personally reviewed following labs and imaging studies  CBC: Recent Labs  Lab 08/20/17 0435 08/21/17 0322 08/21/17 0923 08/22/17 0348 08/23/17 0532  08/24/17 0608 08/25/17 1028  WBC 11.4* 12.0*  --  12.2* 9.2 7.8 7.3  NEUTROABS 10.0* 10.6*  --  10.5*  --  6.2 6.1  HGB 9.1* 8.5* 10.5* 11.5* 10.9* 11.5* 11.2*  HCT 28.6* 26.1* 31.0* 35.4* 33.3* 36.1 35.7*  MCV 83.4 82.6  --  83.3 82.8 85.3 85.6  PLT 384 340  --  358 286 321 568   Basic Metabolic Panel: Recent Labs  Lab 08/19/17 1655  08/21/17 0322 08/21/17 0923 08/22/17 0348 08/23/17 0532 08/24/17 0608 08/25/17 1028  NA 132*   < > 128* 130* 133* 134* 134* 136  K 3.2*   < > 3.3* 3.5 4.8 3.4* 3.6 3.3*  CL 100*   < > 94*  --  99* 99* 97* 97*  CO2 23   < > 23  --  24 28 27 27   GLUCOSE 91   < > 167*  --  98 111* 98 108*  BUN <5*   < > <5*  --  6 5* <5* <5*  CREATININE 0.58   < > 0.64  --  0.62 0.60 0.54 0.44  CALCIUM 7.4*   < > 8.2*  --  6.2* 8.3* 8.7* 8.8*  MG 1.8   < > 1.5*  --  1.2* 1.8 1.7 1.4*  PHOS 1.9*  --   --   --   --   --   --  2.1*   < > = values in this interval not displayed.   GFR: Estimated Creatinine Clearance: 54.1 mL/min (by C-G formula based on SCr of 0.44 mg/dL). Liver Function Tests: Recent Labs  Lab 08/19/17 0337 08/25/17 1028  AST 23 17  ALT 11* 9*  ALKPHOS 79 64  BILITOT 0.6 0.9  PROT 6.1* 5.9*  ALBUMIN 2.9* 2.5*   No results for input(s): LIPASE, AMYLASE in the last 168 hours. No results for input(s): AMMONIA in the last 168 hours. Coagulation Profile: No results for input(s): INR, PROTIME in the last 168 hours. Cardiac Enzymes: Recent Labs  Lab 08/19/17 1241 08/19/17 1655 08/20/17 0015 08/20/17 0435  TROPONINI 0.77* 0.56* 0.45* 0.28*   BNP (last 3 results) No results for input(s): PROBNP in the last 8760 hours. HbA1C: No results for input(s): HGBA1C in the last 72 hours. CBG: No results for input(s): GLUCAP in the last 168 hours. Lipid Profile: No results for input(s): CHOL, HDL, LDLCALC, TRIG, CHOLHDL, LDLDIRECT in the last 72 hours. Thyroid Function Tests: No results for input(s): TSH, T4TOTAL, FREET4, T3FREE, THYROIDAB in  the last 72 hours. Anemia Panel: No results for input(s): VITAMINB12, FOLATE, FERRITIN, TIBC, IRON, RETICCTPCT in the last 72 hours. Sepsis Labs: No results for input(s): PROCALCITON, LATICACIDVEN in the last 168 hours.  Recent Results (from the past 240 hour(s))  MRSA PCR Screening     Status: None   Collection Time: 08/18/17  7:43 PM  Result Value Ref Range Status   MRSA by PCR NEGATIVE NEGATIVE Final    Comment:  The GeneXpert MRSA Assay (FDA approved for NASAL specimens only), is one component of a comprehensive MRSA colonization surveillance program. It is not intended to diagnose MRSA infection nor to guide or monitor treatment for MRSA infections. Performed at [facility], [place] [address] [place], [place] [address]   Culture, blood (routine x 2)     Status: None   Collection Time: 08/18/17  7:54 PM  Result Value Ref Range Status   Specimen Description   Final    BLOOD RIGHT ANTECUBITAL Performed at [facility] [address] [place], [place] [address]    Special Requests   Final    BOTTLES DRAWN AEROBIC AND ANAEROBIC Blood Culture adequate volume Performed at [facility] [address] [place], [place] [address]    Culture   Final    NO GROWTH 5 DAYS Performed at [facility], [place] [address] [place], [place] [address]    Report Status 08/23/2017 FINAL  Final  Culture, blood (routine x 2)     Status: None   Collection Time: 08/18/17  7:54 PM  Result Value Ref Range Status   Specimen Description   Final    BLOOD RIGHT FOREARM Performed at [facility] [address] [place], [place] [address]    Special Requests   Final    BOTTLES DRAWN AEROBIC ONLY Blood Culture results may not be optimal due to an inadequate volume of blood received in culture bottles Performed at [facility] [address] [place], [place] [address]    Culture   Final    NO  GROWTH 5 DAYS Performed at [facility], [place] [address], [place], [place] [address]    Report Status 08/23/2017 FINAL  Final    Radiology Studies: No results found.  Scheduled Meds: . amoxicillin-clavulanate  1 tablet Oral Q12H  . Chlorhexidine Gluconate Cloth  6 each Topical Daily  . diclofenac sodium  2 g Topical QID  . diltiazem  240 mg Oral Daily  . enoxaparin (LOVENOX) injection  40 mg Subcutaneous Q24H  . feeding supplement (ENSURE ENLIVE)  237 mL Oral BID BM  . irbesartan  300 mg Oral Daily  . levothyroxine  25 mcg Intravenous Daily  . pantoprazole  40 mg Oral Daily  . sodium chloride flush  10-40 mL Intracatheter Q12H   Continuous Infusions: . lactated ringers 75 mL/hr at 08/25/17 1908    LOS: 7 days   Kerney Elbe, DO [facility] Pager [phone]  If 7PM-7AM, please contact night-coverage [email or web address] Password [facility] 08/25/2017, 7:13 PM

## 2017-08-25 NOTE — Progress Notes (Signed)
[facility] Progress Note  4 Days Post-Op  Subjective: CC:  Denies abdominal pain. Reports only eating a few bites of food for fear of nausea. Denies nausea, vomiting, belching, or emesis. Having colostomy output and feels comfortable with ostomy care. States she is open to going to rehab. States her GI physician is Dr. Collene Mares.  Objective: Vital signs in last 24 hours: Temp:  [97.6 F (36.4 C)-99 F (37.2 C)] 98.3 F (36.8 C) (03/28 0939) Pulse Rate:  [64-82] 81 (03/28 0939) Resp:  [16-20] 18 (03/28 0939) BP: (121-181)/(40-81) 181/81 (03/28 0939) SpO2:  [94 %-100 %] 97 % (03/28 0939) Last BM Date: 08/25/17  Intake/Output from previous day: 03/27 0701 - 03/28 0700 In: 1800 [I.V.:1800] Out: 201 [Stool:201] Intake/Output this shift: Total I/O In: -  Out: 75 [Stool:75]  PE: Gen:  Alert, NAD, pleasant Pulm:  Normal effort Abd: Soft, non-tender, +BS, midline loop colostomy stoma is viable with small amount liquid stool and gas in colostomy pouch. Skin: warm and dry, no rashes  Psych: A&Ox3   Lab Results:  Recent Labs    08/23/17 0532 08/24/17 0608  WBC 9.2 7.8  HGB 10.9* 11.5*  HCT 33.3* 36.1  PLT 286 321   BMET Recent Labs    08/23/17 0532 08/24/17 0608  NA 134* 134*  K 3.4* 3.6  CL 99* 97*  CO2 28 27  GLUCOSE 111* 98  BUN 5* <5*  CREATININE 0.60 0.54  CALCIUM 8.3* 8.7*   PT/INR No results for input(s): LABPROT, INR in the last 72 hours. CMP     Component Value Date/Time   NA 134 (L) 08/24/2017 0608   NA 139 11/25/2016 1021   K 3.6 08/24/2017 0608   CL 97 (L) 08/24/2017 0608   CO2 27 08/24/2017 0608   GLUCOSE 98 08/24/2017 0608   BUN <5 (L) 08/24/2017 0608   BUN 20 11/25/2016 1021   CREATININE 0.54 08/24/2017 0608   CREATININE 0.69 08/02/2016 1519   CALCIUM 8.7 (L) 08/24/2017 0608   PROT 6.1 (L) 08/19/2017 0337   ALBUMIN 2.9 (L) 08/19/2017 0337   AST 23 08/19/2017 0337   ALT 11 (L) 08/19/2017 0337   ALKPHOS 79 08/19/2017 0337   BILITOT 0.6 08/19/2017 0337   GFRNONAA >60 08/24/2017 0608   GFRAA >60 08/24/2017 0608   Lipase     Component Value Date/Time   LIPASE 21 08/18/2017 1142       Studies/Results: No results found.  Anti-infectives: Anti-infectives (From admission, onward)   Start     Dose/Rate Route Frequency Ordered Stop   08/23/17 1000  amoxicillin-clavulanate (AUGMENTIN) 875-125 MG per tablet 1 tablet     1 tablet Oral Every 12 hours 08/23/17 0858 09/02/17 0959   08/21/17 0910  piperacillin-tazobactam (ZOSYN) 3.375 GM/50ML IVPB    Note to Pharmacy:  [place], [name]: cabinet override      08/21/17 0910 08/21/17 1014   08/19/17 0000  piperacillin-tazobactam (ZOSYN) IVPB 3.375 g  Status:  Discontinued     3.375 g 12.5 mL/hr over 240 Minutes Intravenous Every 8 hours 08/18/17 1703 08/23/17 0858   08/18/17 1545  piperacillin-tazobactam (ZOSYN) IVPB 3.375 g     3.375 g 100 mL/hr over 30 Minutes Intravenous  Once 08/18/17 1540 08/18/17 1707     Assessment/Plan History of endometrial cancer- s/p robotic hyst/BSO - Dr. Alycia Rossetti 12/30/15 History of left carotid disease Coronary artery disease Hypertension Hypothyroid Hyperlipidemia  Obstructing left colon mass withprobableperforation and abscess, POD4s/p laparoscopic diverting loop transverse colostomy 08-21-08  by Dr. Excell Seltzer -no nausea and good bowel function.tolerating soft diet. -appreciate WOC, RN assistance/education -mobilize and pulm toilet(pulling [phone] on IS) -cont mobilization Chronic constipation Left ureter obstruction secondary to the mass.POD3, s/p cystoscopy with left retrograde pyelogram and left JJ stent placement -per urology -foley DC 3/27 -some blood in her urine, likely secondary to stent placement  Atrial fibrillation with ST depression- per cards, primary service Hyponatremia-134this AM Hypokalemia-3.6 this am, per medicine   FEN: soft diet/ensure ID: Zosyn 3/21-3/27, Augmentin 3/27 >>  (would recommend d/c 3/30 to complete a 10 day course - will confirm this with MD) DVT: SCDs, Lovenox; hgb/hct stable Foley:none, voiding well Follow up: Dr. Excell Seltzer  Dispo: stable for discharge to appropriate location. Continue diet supplementation with protein shakes as patient not eating large volume of solids. Follow up with Dr. Excell Seltzer in 2-4 weeks.    LOS: 7 days    [facility] 08/25/2017, 10:54 AM Pager: [phone] Consults: [phone] Mon-Fri 7:00 am-4:30 pm Sat-Sun 7:00 am-11:30 am

## 2017-08-25 NOTE — Clinical Social Work Note (Signed)
Clinical Social Work Assessment  Patient Details  Name: Shannon Scott MRN: [id number] Date of Birth: 09/29/1940  Date of referral:  08/25/17               Reason for consult:  Facility Placement                Permission sought to share information with:  Chartered certified accountant granted to share information::  Yes, Verbal Permission Granted  Name::        Agency::     Relationship::     Contact Information:     Housing/Transportation Living arrangements for the past 2 months:  Single Family Home Source of Information:  Patient Patient Interpreter Needed:  None Criminal Activity/Legal Involvement Pertinent to Current Situation/Hospitalization:  No - Comment as needed Significant Relationships:  Adult Children Lives with:  Self Do you feel safe going back to the place where you live?  (PT recommending SNF) Need for family participation in patient care:  No (Coment)  Care giving concerns:  Patient from home alone. Patient reported that prior to hospitalization she was independent with ADLs. Patient admitted with principal problem colonic mass. PT recommending SNF.    Social Worker assessment / plan:  CSW spoke with patient at bedside regarding PT recommendation for SNF, patient accompanied by son Jenny Reichmann). CSW explained SNF placement process and insurance authorization, patient verbalized understanding. Patient reported that she is agreeable to SNF for ST rehab. CSW agreed to complete FL2 and follow up with bed offers.  CSW completed FL2 and will follow up with bed offers.  CSW will continue to follow and assist with discharge planning.  Employment status:  Retired Nurse, adult PT Recommendations:  [place] / Referral to community resources:  [place]  Patient/Family's Response to care:  Patient/patient' son appreciative of CSW assistance with discharge planning.  Patient/Family's  Understanding of and Emotional Response to Diagnosis, Current Treatment, and Prognosis:  Patient presented calm and verbalized understanding of diagnosis and current treatment. Patient verbalized plan to dc to SNF for ST rehab. Patient hopeful to regain strength and independence for an upcoming trip planned to [place].   Emotional Assessment Appearance:  Appears stated age Attitude/Demeanor/Rapport:  Other(Cooperative) Affect (typically observed):  Pleasant, Appropriate Orientation:  Oriented to Self, Oriented to Situation, Oriented to Place, Oriented to  Time Alcohol / Substance use:  Not Applicable Psych involvement (Current and /or in the community):  No (Comment)  Discharge Needs  Concerns to be addressed:  Care Coordination Readmission within the last 30 days:  No Current discharge risk:  Physical Impairment Barriers to Discharge:  Continued Medical Work up   [employer], LCSW 08/25/2017, 4:21 PM

## 2017-08-25 NOTE — NC FL2 (Signed)
[place] LEVEL OF CARE SCREENING TOOL     IDENTIFICATION  Patient Name: Shannon Scott Birthdate: [birth year] Sex: female Admission Date (Current Location): 08/18/2017  [facility] and [place] Number:  Herbalist and Address:  [facility],  [place], [place]      Provider Number: [id number]  Attending Physician Name and Address:  Kerney Elbe, DO  Relative Name and Phone Number:       Current Level of Care: Hospital Recommended Level of Care: [place] Prior Approval Number:    Date Approved/Denied:   PASRR Number: [id number]  Discharge Plan: SNF    Current Diagnoses: Patient Active Problem List   Diagnosis Date Noted  . New onset atrial fibrillation ([place])   . Bowel perforation ([place]) 08/18/2017  . Colonic mass 08/18/2017  . Ureteral dilatation 08/18/2017  . Intractable right heel pain 10/07/2016  . Plantar fasciitis of right foot 10/07/2016  . Postoperative nausea and vomiting 01/02/2016  . Hyponatremia 01/01/2016  . Acute hypokalemia 01/01/2016  . Endometrial cancer ([place]) 12/30/2015  . Familial hypercholesteremia 07/28/2015  . Aortic valve stenosis 08/02/2014  . S/P CABG x 3 01/13/2013  . Palpitations 01/13/2013  . Medication intolerance 01/13/2013  . Chronic pain 01/13/2013  . TIA (transient ischemic attack) 01/13/2013  . Carotid stenosis 01/13/2013  . Hypothyroid 10/05/2010  . Essential hypertension 10/05/2010  . Gastroesophagitis 10/05/2010  . Atrophic gastritis 10/05/2010    Orientation RESPIRATION BLADDER Height & Weight     Self, Time, Situation, Place  Normal Continent Weight: 153 lb (69.4 kg) Height:  5\' 1"  (154.9 cm)  BEHAVIORAL SYMPTOMS/MOOD NEUROLOGICAL BOWEL NUTRITION STATUS      Colostomy Diet(see dc summary)  AMBULATORY STATUS COMMUNICATION OF NEEDS Skin   Limited Assist Verbally Other (Comment)(Incision(Closed)AbdomenOtherDressing Type:Liquid skin adhesive)                        Personal Care Assistance Level of Assistance  Bathing, Feeding, Dressing Bathing Assistance: Limited assistance Feeding assistance: Independent Dressing Assistance: Limited assistance     Functional Limitations Info  Sight, Hearing, Speech Sight Info: Adequate Hearing Info: Adequate Speech Info: Adequate    SPECIAL CARE FACTORS FREQUENCY  PT (By licensed PT), OT (By licensed OT)     PT Frequency: 5x/week OT Frequency: 5x/week            Contractures Contractures Info: Not present    Additional Factors Info  Code Status, Allergies Code Status Info: Full Code Allergies Info: Statins; Ciprofloxacin;           Current Medications (08/25/2017):  This is the current hospital active medication list Current Facility-Administered Medications  Medication Dose Route Frequency Provider Last Rate Last Dose  . acetaminophen (TYLENOL) tablet 650 mg  650 mg Oral Q6H PRN Raiford Noble Roca, DO   650 mg at 08/25/17 1500  . amoxicillin-clavulanate (AUGMENTIN) 875-125 MG per tablet 1 tablet  1 tablet Oral Q12H Patrecia Pour, Christean Grief, MD   1 tablet at 08/25/17 1039  . Chlorhexidine Gluconate Cloth 2 % PADS 6 each  6 each Topical Daily Excell Seltzer, MD   6 each at 08/25/17 1500  . diclofenac sodium (VOLTAREN) 1 % transdermal gel 2 g  2 g Topical QID Excell Seltzer, MD   2 g at 08/25/17 1055  . diltiazem (CARDIZEM CD) 24 hr capsule 240 mg  240 mg Oral Daily Josue Hector, MD   240 mg at 08/25/17 1039  .  enoxaparin (LOVENOX) injection 40 mg  40 mg Subcutaneous Q24H Patrecia Pour, Christean Grief, MD   40 mg at 08/25/17 1039  . feeding supplement (ENSURE ENLIVE) (ENSURE ENLIVE) liquid 237 mL  237 mL Oral BID BM Sheikh, Omair Latif, DO   237 mL at 08/25/17 1501  . hydrALAZINE (APRESOLINE) injection 10 mg  10 mg Intravenous Q6H PRN Excell Seltzer, MD   10 mg at 08/24/17 1005  . irbesartan (AVAPRO) tablet 300 mg  300 mg Oral Daily Patrecia Pour, Christean Grief, MD   300 mg at  08/25/17 1039  . lactated ringers infusion   Intravenous Continuous Excell Seltzer, MD 75 mL/hr at 08/25/17 0533    . levothyroxine (SYNTHROID, LEVOTHROID) injection 25 mcg  25 mcg Intravenous Daily Excell Seltzer, MD   25 mcg at 08/25/17 1041  . morphine 4 MG/ML injection 4 mg  4 mg Intravenous Q3H PRN Excell Seltzer, MD   4 mg at 08/25/17 1358  . ondansetron (ZOFRAN) injection 4 mg  4 mg Intravenous Q6H PRN Excell Seltzer, MD   4 mg at 08/21/17 1031  . pantoprazole (PROTONIX) EC tablet 40 mg  40 mg Oral Daily Sheikh, Omair Latif, DO      . phenol (CHLORASEPTIC) mouth spray 1 spray  1 spray Mouth/Throat PRN Excell Seltzer, MD      . sodium chloride flush (NS) 0.9 % injection 10-40 mL  10-40 mL Intracatheter Q12H Excell Seltzer, MD   10 mL at 08/25/17 1105  . sodium chloride flush (NS) 0.9 % injection 10-40 mL  10-40 mL Intracatheter PRN Excell Seltzer, MD         Discharge Medications: Please see discharge summary for a list of discharge medications.  Relevant Imaging Results:  Relevant Lab Results:   Additional Information SSN  [id number]  Burnis Medin, LCSW

## 2017-08-25 NOTE — Progress Notes (Signed)
Progress Note  Patient Name: Shannon Scott Date of Encounter: 08/25/2017  Primary Cardiologist: Pixie Casino, MD   Subjective   No complaints taking liquids   Inpatient Medications    Scheduled Meds: . amoxicillin-clavulanate  1 tablet Oral Q12H  . Chlorhexidine Gluconate Cloth  6 each Topical Daily  . diclofenac sodium  2 g Topical QID  . diltiazem  240 mg Oral Daily  . enoxaparin (LOVENOX) injection  40 mg Subcutaneous Q24H  . feeding supplement  1 Container Oral TID BM  . irbesartan  300 mg Oral Daily  . levothyroxine  25 mcg Intravenous Daily  . pantoprazole (PROTONIX) IV  40 mg Intravenous Q24H  . sodium chloride flush  10-40 mL Intracatheter Q12H   Continuous Infusions: . lactated ringers 75 mL/hr at 08/25/17 0533   PRN Meds: hydrALAZINE, morphine injection, ondansetron (ZOFRAN) IV, phenol, sodium chloride flush   Vital Signs    Vitals:   08/24/17 1600 08/24/17 1805 08/24/17 2048 08/25/17 0511  BP: (!) 157/47 (!) 152/67 (!) 143/62 125/64  Pulse: 72 82 80 64  Resp: 18 20 18 18   Temp: 98.6 F (37 C) 98.3 F (36.8 C) 99 F (37.2 C) 97.6 F (36.4 C)  TempSrc: Oral Oral Oral Oral  SpO2: 95% 97% 98% 100%  Weight:      Height:        Intake/Output Summary (Last 24 hours) at 08/25/2017 0801 Last data filed at 08/25/2017 0600 Gross per 24 hour  Intake 1800 ml  Output 101 ml  Net 1699 ml   Filed Weights   08/22/17 0000  Weight: 153 lb (69.4 kg)    Telemetry  NSR rates 80-90 08/25/2017  Personally Reviewed  Physical Exam   Affect appropriate Pale elderly female  HEENT: normal Neck supple with no adenopathy JVP normal no bruits no thyromegaly Lungs clear with no wheezing and good diaphragmatic motion Heart:  S1/S2 SEM  murmur, no rub, gallop or click PMI normal Abdomen: soft post surgery with new ostomy  Distal pulses intact with no bruits No edema Neuro non-focal Skin warm and dry No muscular weakness   Labs    Chemistry Recent  Labs  Lab 08/18/17 1142  08/19/17 0337  08/22/17 0348 08/23/17 0532 08/24/17 0608  NA 123*   < > 130*   < > 133* 134* 134*  K 2.1*   < > 2.6*   < > 4.8 3.4* 3.6  CL 80*   < > 93*   < > 99* 99* 97*  CO2 29   < > 26   < > 24 28 27   GLUCOSE 123*   < > 112*   < > 98 111* 98  BUN 6   < > 5*   < > 6 5* <5*  CREATININE 0.58   < > 0.68   < > 0.62 0.60 0.54  CALCIUM 9.1   < > 8.3*   < > 6.2* 8.3* 8.7*  PROT 7.9  --  6.1*  --   --   --   --   ALBUMIN 3.8  --  2.9*  --   --   --   --   AST 28  --  23  --   --   --   --   ALT 12*  --  11*  --   --   --   --   ALKPHOS 100  --  79  --   --   --   --  BILITOT 0.4  --  0.6  --   --   --   --   GFRNONAA >60   < > >60   < > >60 >60 >60  GFRAA >60   < > >60   < > >60 >60 >60  ANIONGAP 14   < > 11   < > 10 7 10    < > = values in this interval not displayed.     Hematology Recent Labs  Lab 08/22/17 0348 08/23/17 0532 08/24/17 0608  WBC 12.2* 9.2 7.8  RBC 4.25 4.02 4.23  HGB 11.5* 10.9* 11.5*  HCT 35.4* 33.3* 36.1  MCV 83.3 82.8 85.3  MCH 27.1 27.1 27.2  MCHC 32.5 32.7 31.9  RDW 15.8* 15.5 15.8*  PLT 358 286 321    Cardiac Enzymes Recent Labs  Lab 08/19/17 1241 08/19/17 1655 08/20/17 0015 08/20/17 0435  TROPONINI 0.77* 0.56* 0.45* 0.28*    Recent Labs  Lab 08/18/17 1650  TROPIPOC 0.08     BNPNo results for input(s): BNP, PROBNP in the last 168 hours.   DDimer No results for input(s): DDIMER in the last 168 hours.   Radiology    No results found.  Cardiac Studies   Echocardiogram 08/19/17: Study Conclusions  - Left ventricle: The cavity size was normal. Wall thickness was   increased in a pattern of mild LVH. Systolic function was   vigorous. The estimated ejection fraction was in the range of 65%   to 70%. Wall motion was normal; there were no regional wall   motion abnormalities. Left ventricular diastolic function   parameters were normal. - Aortic valve: Valve mobility was restricted. - Mitral valve:  Moderately calcified annulus. - Left atrium: The atrium was moderately dilated. - Right atrium: The atrium was mildly dilated. - Tricuspid valve: There was severe regurgitation.  Patient Profile     77 y.o. female with PMH CAD s/p CABG 2005, HTN, Dyslipidemia, found to have atrial fibrillation with RVR which spontaneously converted to NSR, also with colonic mass. Cardiology following in perioperative setting.    Assessment & Plan    1. New onset paroxysmal atrial fibrillation: converted now 48 hours post op Maintaining NSR now off amiodarone no anticoagulation unless recurs given recent ostomy and GI surgery And self limited time   2. CAD s/p CABG: patient with demand ischemia in the setting of Afib RVR. Trop peaked at 0.77. EKG  No chest pain no indication for stress testing at this time   3. Colonic mass s/p resection: s/p laparoscopic diverting loop transvers colostomy 3/24. Taking PO plans per surgery   4. L ureter obstruction 2/2 colonic mass: s/p cystoscopy with L retrograde pyelogram with L JJ stent placement 3/24 urinating well per urology   5. HTN: continue cardizem and ARB    7. Aortic Stenosis: Sclerosis / mild stenosis stable    Will sign off   Jenkins Rouge

## 2017-08-26 LAB — COMPREHENSIVE METABOLIC PANEL
ALT: 11 U/L — ABNORMAL LOW (ref 14–54)
AST: 20 U/L (ref 15–41)
Albumin: 2.4 g/dL — ABNORMAL LOW (ref 3.5–5.0)
Alkaline Phosphatase: 64 U/L (ref 38–126)
Anion gap: 8 (ref 5–15)
BUN: <5 mg/dL — ABNORMAL LOW (ref 6–20)
CO2: 29 mmol/L (ref 22–32)
Calcium: 8.7 mg/dL — ABNORMAL LOW (ref 8.9–10.3)
Chloride: 99 mmol/L — ABNORMAL LOW (ref 101–111)
Creatinine, Ser: 0.49 mg/dL (ref 0.44–1.00)
GFR calc Af Amer: >60 mL/min (ref >60)
GFR calc non Af Amer: >60 mL/min (ref >60)
Glucose, Bld: 98 mg/dL (ref 65–99)
Potassium: 3.8 mmol/L (ref 3.5–5.1)
Sodium: 136 mmol/L (ref 135–145)
Total Bilirubin: 0.5 mg/dL (ref 0.3–1.2)
Total Protein: 6 g/dL — ABNORMAL LOW (ref 6.5–8.1)

## 2017-08-26 LAB — CBC WITH DIFFERENTIAL/PLATELET
Basophils Absolute: 0 10*3/uL (ref 0.0–0.1)
Basophils Relative: 0 %
Eosinophils Absolute: 0.5 10*3/uL (ref 0.0–0.7)
Eosinophils Relative: 7 %
HCT: 33.9 % — ABNORMAL LOW (ref 36.0–46.0)
Hemoglobin: 11 g/dL — ABNORMAL LOW (ref 12.0–15.0)
Lymphocytes Relative: 13 %
Lymphs Abs: 0.9 10*3/uL (ref 0.7–4.0)
MCH: 27.4 pg (ref 26.0–34.0)
MCHC: 32.4 g/dL (ref 30.0–36.0)
MCV: 84.3 fL (ref 78.0–100.0)
Monocytes Absolute: 0.7 10*3/uL (ref 0.1–1.0)
Monocytes Relative: 9 %
Neutro Abs: 4.8 10*3/uL (ref 1.7–7.7)
Neutrophils Relative %: 71 %
Platelets: 333 10*3/uL (ref 150–400)
RBC: 4.02 MIL/uL (ref 3.87–5.11)
RDW: 15.7 % — ABNORMAL HIGH (ref 11.5–15.5)
WBC: 6.9 10*3/uL (ref 4.0–10.5)

## 2017-08-26 LAB — MAGNESIUM: Magnesium: 1.8 mg/dL (ref 1.7–2.4)

## 2017-08-26 LAB — PHOSPHORUS: Phosphorus: 3.4 mg/dL (ref 2.5–4.6)

## 2017-08-26 MED ORDER — PANTOPRAZOLE SODIUM 40 MG PO TBEC: 40.0000 mg | DELAYED_RELEASE_TABLET | Freq: Every day | ORAL | 0 refills | Status: DC

## 2017-08-26 MED ORDER — DILTIAZEM HCL ER COATED BEADS 240 MG PO CP24: 240.0000 mg | ORAL_CAPSULE | Freq: Every day | ORAL | 0 refills | Status: DC

## 2017-08-26 MED ORDER — DICLOFENAC SODIUM 1 % TD GEL: 2.0000 g | Freq: Four times a day (QID) | TRANSDERMAL | 0 refills | Status: DC

## 2017-08-26 MED ORDER — ENSURE ENLIVE PO LIQD: 237.0000 mL | Freq: Two times a day (BID) | ORAL | 12 refills | Status: DC

## 2017-08-26 MED ORDER — TRAMADOL HCL 50 MG PO TABS: 50.0000 mg | ORAL_TABLET | Freq: Two times a day (BID) | ORAL | 0 refills | Status: DC | PRN

## 2017-08-26 MED ORDER — AMOXICILLIN-POT CLAVULANATE 875-125 MG PO TABS: 1.0000 | ORAL_TABLET | Freq: Two times a day (BID) | ORAL | 0 refills | Status: AC

## 2017-08-26 NOTE — Progress Notes (Signed)
Spoke with pt and son concerning SNF vs HH. Son was upset that no one explained this to the family. Son was thinking that his mother was not alert and oriented related to her taking pain medications. At the time the pt was spoken to concerning discharge on yesterday, the had not had any pain medication for over 10 hours. Pt was  A & O, stated that she was not going until she was ready. Today this CM was called in because the pt asked for the cost of staying over the weekend. Explained to son and pt that the cost was 10,000.00/day and insurance would not pay. So continued to think family should have been called. If pt discharge home son selected [place].

## 2017-08-26 NOTE — Clinical Social Work Placement (Signed)
Patient received and accepted bed offer at [facility]. Facility aware of patient's discharge and confirmed bed offer. PTAR contacted, patient's family notified. Patient's RN can call report to [phone], packet complete. CSW signing off, no other needs identified at this time.   CLINICAL SOCIAL WORK PLACEMENT  NOTE  Date:  08/26/2017  Patient Details  Name: Shannon Scott MRN: [id number] Date of Birth: February 07, 1941  Clinical Social Work is seeking post-discharge placement for this patient at the [place] level of care (*CSW will initial, date and re-position this form in  chart as items are completed):  Yes   Patient/family provided with [facility] Work Department's list of facilities offering this level of care within the geographic area requested by the patient (or if unable, by the patient's family).  Yes   Patient/family informed of their freedom to choose among providers that offer the needed level of care, that participate in Medicare, Medicaid or managed care program needed by the patient, have an available bed and are willing to accept the patient.  Yes   Patient/family informed of [facility]'s ownership interest in [facility] and [facility], as well as of the fact that they are under no obligation to receive care at these facilities.  PASRR submitted to EDS on       PASRR number received on 08/25/17     Existing PASRR number confirmed on       FL2 transmitted to all facilities in geographic area requested by pt/family on 08/25/17     FL2 transmitted to all facilities within larger geographic area on       Patient informed that his/her managed care company has contracts with or will negotiate with certain facilities, including the following:        Yes   Patient/family informed of bed offers received.  Patient chooses bed at [facility]     Physician recommends and patient chooses bed at      Patient to be  transferred to [facility] on 08/26/17.  Patient to be transferred to facility by PTAR     Patient family notified on 08/26/17 of transfer.  Name of family member notified:  Alverda Skeans     PHYSICIAN       Additional Comment:    _______________________________________________ Burnis Medin, LCSW 08/26/2017, 4:56 PM

## 2017-08-26 NOTE — Progress Notes (Signed)
[facility] Progress Note  5 Days Post-Op  Subjective: CC:  Denies abdominal pain. C/o L hip pain from bursitis. Tolerating PO but not eating a lot of solid food - drinking ensure. Having ostomy output. Mobilizing with walker. Pulling ~750 cc on IS.  Social work at bedside discussing SNF options.  Objective: Vital signs in last 24 hours: Temp:  [98.1 F (36.7 C)-98.4 F (36.9 C)] 98.2 F (36.8 C) (03/29 0403) Pulse Rate:  [73-87] 87 (03/29 0403) Resp:  [18] 18 (03/29 0403) BP: (140-180)/(54-88) 154/88 (03/29 0403) SpO2:  [96 %-100 %] 100 % (03/29 0403) Last BM Date: 08/25/17  Intake/Output from previous day: 03/28 0701 - 03/29 0700 In: 1845 [P.O.:120; I.V.:1725] Out: 425 [Stool:425] Intake/Output this shift: No intake/output data recorded.  PE: Gen:  Alert, NAD, pleasant Abd: Soft, non-tender, +BS, midline loop colostomy stoma is viable with small amount liquid stool and gas in colostomy pouch.  Lab Results:  Recent Labs    08/25/17 1028 08/26/17 0359  WBC 7.3 6.9  HGB 11.2* 11.0*  HCT 35.7* 33.9*  PLT 342 333   BMET Recent Labs    08/25/17 1028 08/26/17 0359  NA 136 136  K 3.3* 3.8  CL 97* 99*  CO2 27 29  GLUCOSE 108* 98  BUN <5* <5*  CREATININE 0.44 0.49  CALCIUM 8.8* 8.7*   PT/INR No results for input(s): LABPROT, INR in the last 72 hours. CMP     Component Value Date/Time   NA 136 08/26/2017 0359   NA 139 11/25/2016 1021   K 3.8 08/26/2017 0359   CL 99 (L) 08/26/2017 0359   CO2 29 08/26/2017 0359   GLUCOSE 98 08/26/2017 0359   BUN <5 (L) 08/26/2017 0359   BUN 20 11/25/2016 1021   CREATININE 0.49 08/26/2017 0359   CREATININE 0.69 08/02/2016 1519   CALCIUM 8.7 (L) 08/26/2017 0359   PROT 6.0 (L) 08/26/2017 0359   ALBUMIN 2.4 (L) 08/26/2017 0359   AST 20 08/26/2017 0359   ALT 11 (L) 08/26/2017 0359   ALKPHOS 64 08/26/2017 0359   BILITOT 0.5 08/26/2017 0359   GFRNONAA >60 08/26/2017 0359   GFRAA >60 08/26/2017 0359   Lipase      Component Value Date/Time   LIPASE 21 08/18/2017 1142       Studies/Results: No results found.  Anti-infectives: Anti-infectives (From admission, onward)   Start     Dose/Rate Route Frequency Ordered Stop   08/23/17 1000  amoxicillin-clavulanate (AUGMENTIN) 875-125 MG per tablet 1 tablet     1 tablet Oral Every 12 hours 08/23/17 0858 09/02/17 0959   08/21/17 0910  piperacillin-tazobactam (ZOSYN) 3.375 GM/50ML IVPB    Note to Pharmacy:  Gigliotti, [name]: cabinet override      08/21/17 0910 08/21/17 1014   08/19/17 0000  piperacillin-tazobactam (ZOSYN) IVPB 3.375 g  Status:  Discontinued     3.375 g 12.5 mL/hr over 240 Minutes Intravenous Every 8 hours 08/18/17 1703 08/23/17 0858   08/18/17 1545  piperacillin-tazobactam (ZOSYN) IVPB 3.375 g     3.375 g 100 mL/hr over 30 Minutes Intravenous  Once 08/18/17 1540 08/18/17 1707     Assessment/Plan History of endometrial cancer- s/p robotic hyst/BSO - Dr. Alycia Rossetti 12/30/15 History of left carotid disease Coronary artery disease Hypertension Hypothyroid Hyperlipidemia  Obstructing left colon mass withprobableperforation and abscess, POD5s/p laparoscopic diverting loop transverse colostomy 08-21-08 by Dr. Excell Seltzer -no nausea and good bowel function.tolerating soft diet. -appreciate WOC, RN assistance/education -mobilize and pulm toilet(pulling [phone] on  IS) -cont mobilization Chronic constipation Left ureter obstruction secondary to the mass.POD5, s/p cystoscopy with left retrograde pyelogram and left JJ stent placement -per urology -foley DC 3/27 -some blood in her urine, likely secondary to stent placement Atrial fibrillation with ST depression- per cards   FEN: soft diet/ensure ID: Zosyn 3/21-3/27, Augmentin 3/27 >> (would recommend d/c 3/30 to complete a 10 day course) DVT: SCDs, Lovenox; hgb/hct stable Foley:none, voiding well Follow up: Dr. Excell Seltzer  Dispo: stable for discharge to appropriate  location. Continue diet supplementation with protein shakes as patient not eating large volume of solids. Follow up with Dr. Excell Seltzer in 2-4 weeks.     LOS: 8 days    Jill Alexanders , [facility] 08/26/2017, 9:57 AM Pager: [phone] Consults: [phone] Mon-Fri 7:00 am-4:30 pm Sat-Sun 7:00 am-11:30 am

## 2017-08-26 NOTE — Consult Note (Addendum)
[name] ostomy follow up Stoma type/location: Colostomy stoma red and viable, edematous and shiny, above skin level.  Stomal assessment/size:  2 inches Peristomal assessment: Red irritated areas where 4 sutures were removed. Output: Mod amt brown unformed stool  Ostomy pouching: 1pc.  Education provided: Pt was able to perform pouch change with minimal assistance using one piece flat pouch. She is able to open and close the velcro to empty.  Reviewed pouching routines and ordering supplies.  She asked appropriate questions. 5 pouches at the bedside for staff nurses use and when discharged. Educational materials in the room.  Pt denies further questions. Enrolled patient in [place] Start Discharge program: Yes Shannon Girt MSN, RN, [place], [place], [place]

## 2017-08-28 NOTE — Consult Note (Signed)
[place] Nurse ostomy follow up Stoma type:  Colostomy Patient's family contacted [place] [facility] and reported that they left the hospital without the patient's colostomy education materials and without backup supplies.  RN (D. Dark) looked in room and found no materials.  [place] Nurse contacted and asked to supply patient education materials and supplies and that family would be returning to hospital for pickup and going over of materials.   WOC Nurse review last note from D. Barbie Haggis, [place] Nurse and prepared educational package and supplies for patient's family. Additionally, this Probation officer met with them for short session today.  Patent's family expressed appreciation for replacement of supplies and materials.  [place] nursing team will not follow post discharge, but will remain available to this patient, the nursing, surgical and medical teams.  Please re-consult if needed. Thanks, Maudie Flakes, MSN, RN, [place], [place] Abbott  Pager# [phone]

## 2017-08-29 ENCOUNTER — Encounter: Payer: Self-pay | Admitting: Internal Medicine

## 2017-08-29 ENCOUNTER — Non-Acute Institutional Stay (SKILLED_NURSING_FACILITY): Payer: Medicare Other | Admitting: Internal Medicine

## 2017-08-29 DIAGNOSIS — D62 Acute posthemorrhagic anemia: Secondary | ICD-10-CM

## 2017-08-29 DIAGNOSIS — T83122D Displacement of urinary stent, subsequent encounter: Secondary | ICD-10-CM

## 2017-08-29 DIAGNOSIS — Z933 Colostomy status: Secondary | ICD-10-CM | POA: Diagnosis not present

## 2017-08-29 DIAGNOSIS — K631 Perforation of intestine (nontraumatic): Secondary | ICD-10-CM

## 2017-08-29 DIAGNOSIS — K639 Disease of intestine, unspecified: Secondary | ICD-10-CM

## 2017-08-29 DIAGNOSIS — K219 Gastro-esophageal reflux disease without esophagitis: Secondary | ICD-10-CM

## 2017-08-29 DIAGNOSIS — N2882 Megaloureter: Secondary | ICD-10-CM | POA: Diagnosis not present

## 2017-08-29 DIAGNOSIS — M7989 Other specified soft tissue disorders: Secondary | ICD-10-CM

## 2017-08-29 DIAGNOSIS — K6389 Other specified diseases of intestine: Secondary | ICD-10-CM

## 2017-08-29 DIAGNOSIS — I10 Essential (primary) hypertension: Secondary | ICD-10-CM

## 2017-08-29 DIAGNOSIS — E871 Hypo-osmolality and hyponatremia: Secondary | ICD-10-CM

## 2017-08-29 DIAGNOSIS — E876 Hypokalemia: Secondary | ICD-10-CM | POA: Diagnosis not present

## 2017-08-29 DIAGNOSIS — D649 Anemia, unspecified: Secondary | ICD-10-CM

## 2017-08-29 DIAGNOSIS — I4891 Unspecified atrial fibrillation: Secondary | ICD-10-CM

## 2017-08-29 DIAGNOSIS — E034 Atrophy of thyroid (acquired): Secondary | ICD-10-CM

## 2017-08-29 NOTE — Progress Notes (Signed)
: Provider:  Noah Delaine. Shannon Coil, MD Location:  [place] Room Number: [phone] Place of Service:  SNF [phone])  PCP: Shannon Spurling, MD Patient Care Team: Shannon Spurling, MD as PCP - General (Endocrinology) Shannon Pickett Nadean Corwin, MD as PCP - Cardiology (Cardiology)  Extended Emergency Contact Information Primary Emergency Contact: Shannon Scott of [place] Work Phone: [phone] Mobile Phone: [phone] Relation: Son Secondary Emergency Contact: Shannon Scott,Shannon Scott Address: [address], [place] of [place] Phone: [phone] Work Phone: [phone] Relation: Daughter     Allergies: Statins and Ciprofloxacin  Chief Complaint  Patient presents with  . New Admit To SNF    Admit to Facility    HPI: Patient is 77 y.o. female with coronary artery disease status post CABG, endometrial cancer status post total hysterectomy, hypothyroidism who presented to the [facility] emergency department with complaints of generalized abdominal pain, nausea and vomiting that had worsened since the last 2-3 days.  No reported loss of appetite and weight and some blood in stool recently.  Last colonoscopy was 10 years ago.  Ports chills but no fever.  Patient denied any chest pain, shortness of breath, dysuria, fever.  ED patient was found to be hyponatremic and severely hypokalemic with blood pressure elevated on presentation.  She had a brief episode of atrial fibrillation RVR.  CT of abdomen showed a heterogeneous ill-defined mass involving the sigmoid colon in the left pelvis with suspicion for perforation and abscess formation.  Mass was also compressing left ureter causing dilatation.  Patient was admitted to [facility] from 3/20-29 where she underwent JJ stenting and a laparoscopic diverting loop transverse colostomy on 3/24.  Radiology was consulted and heart rate converted to normal sinus rhythm.  Patient's  major complaints status post surgery was her left hip bursitis.  Of note, no biopsies were taken from the mass, therefore a colonoscopy postop will be needed for biopsy.  Patient is admitted to skilled nursing facility for OT/PT.  While at skilled nursing facility patient will be followed for hypertension treated with diltiazem and Avapro, hypothyroidism treated with replacement and GERD treated with Protonix.  Past Medical History:  Diagnosis Date  . Arthritis   . Asthma    allergy induced  . Bilateral carotid artery disease (HCC)    L carotid bruit  . CAD (coronary artery disease)   . Cancer ([place])   . Dyslipidemia    intolerant to statins, welchol, niacin, zetia  . History of blood transfusion   . History of nuclear stress test 04/24/2012   lexiscan; normal study  . Hypertension   . Hypothyroidism   . Postoperative nausea and vomiting 01/02/2016    Past Surgical History:  Procedure Laterality Date  . ABDOMINAL HYSTERECTOMY    . Carotid Doppler  02/2012   40-59% right int carotid artery stenosis; 60-79% L int carotid stenosis; L carotid bruit  . CORONARY ARTERY BYPASS GRAFT  03/12/2004   LIMA to LAD, SVG to circumflex, SVG to PDA  . CYSTOSCOPY WITH STENT PLACEMENT Bilateral 08/21/2017   Procedure: CYSTOSCOPY WITH STENT PLACEMENT;  Surgeon: Ceasar Mons, MD;  Location: [facility];  Service: Urology;  Laterality: Bilateral;  . IR GENERIC HISTORICAL  04/29/2016   IR RADIOLOGIST EVAL & MGMT 04/29/2016 Sandi Mariscal, MD [facility] INTERV RAD  . IR GENERIC HISTORICAL  05/12/2016   IR RADIOLOGIST EVAL & MGMT 05/12/2016 Sandi Mariscal,  MD [facility] INTERV RAD  . ROBOTIC ASSISTED TOTAL HYSTERECTOMY WITH BILATERAL SALPINGO OOPHERECTOMY Bilateral 12/30/2015   Procedure: XI ROBOTIC ASSISTED TOTAL HYSTERECTOMY WITH BILATERAL SALPINGO OOPHORECTOMY WITH SENTAL LYMPH NODE BIOPSY;  Surgeon: Nancy Marus, MD;  Location: [facility];  Service: Gynecology;  Laterality: Bilateral;  . TONSILLECTOMY    .  TRANSTHORACIC ECHOCARDIOGRAM  04/2007   EF>55%; mild MR; mild-mod TR; mild pulm HTN; mild calcification of aortiv valve leaflets with mild valvular aortic stenosis  . TUBAL LIGATION      Allergies as of 08/29/2017      Reactions   Statins Other (See Comments)   Myalgias and memory problems   Ciprofloxacin Itching   Splotchy redness with itching during IV infusion localized to arm.      Medication List        Accurate as of 08/29/17  8:47 AM. Always use your most recent med list.          acetaminophen 500 MG tablet Commonly known as:  TYLENOL Take 500 mg by mouth every 6 (six) hours as needed for mild pain, moderate pain, fever or headache.   amoxicillin-clavulanate 875-125 MG tablet Commonly known as:  AUGMENTIN Take 1 tablet by mouth 2 (two) times daily.   diclofenac sodium 1 % Gel Commonly known as:  VOLTAREN Apply 2 g topically 4 (four) times daily.   diltiazem 240 MG 24 hr capsule Commonly known as:  CARDIZEM CD Take 1 capsule (240 mg total) by mouth daily.   feeding supplement (ENSURE ENLIVE) Liqd Take 237 mLs by mouth 2 (two) times daily between meals.   irbesartan 300 MG tablet Commonly known as:  AVAPRO take 1 tablet by mouth once daily   levothyroxine 50 MCG tablet Commonly known as:  SYNTHROID, LEVOTHROID Take 50 mcg by mouth daily before breakfast.   pantoprazole 40 MG tablet Commonly known as:  PROTONIX Take 1 tablet (40 mg total) by mouth daily.   polyethylene glycol packet Commonly known as:  MIRALAX / GLYCOLAX Take 17 g by mouth daily as needed for mild constipation.   traMADol 50 MG tablet Commonly known as:  ULTRAM Take 1 tablet (50 mg total) by mouth every 12 (twelve) hours as needed for moderate pain.       No orders of the defined types were placed in this encounter.    There is no immunization history on file for this patient.  Social History   Tobacco Use  . Smoking status: Never Smoker  . Smokeless tobacco: Never Used    Substance Use Topics  . Alcohol use: No    Family history is   Family History  Problem Relation Age of Onset  . Hypertension Mother   . Heart disease Mother        Died in her 68s  . Stroke Father   . Kidney disease Brother   . Heart disease Brother        also HTN, hyperlipidemia  . Heart attack Brother   . Stroke Sister        x2  . Hypertension Sister       Review of Systems  DATA OBTAINED: from patient, nurse GENERAL:  no fevers, fatigue, appetite changes SKIN: No itching, or rash EYES: No eye pain, redness, discharge EARS: No earache, tinnitus, change in hearing NOSE: No congestion, drainage or bleeding  MOUTH/THROAT: No mouth or tooth pain, No sore throat RESPIRATORY: No cough, wheezing, SOB CARDIAC: No chest pain, palpitations, lower extremity edema  GI: No  abdominal pain, No N/V/D or constipation, No heartburn or reflux  GU: No dysuria, frequency or urgency, or incontinence  MUSCULOSKELETAL: No unrelieved bone/joint pain NEUROLOGIC: No headache, dizziness or focal weakness PSYCHIATRIC: No c/o anxiety or sadness   Vitals:   08/29/17 0832  BP: 120/80  Pulse: 66  Resp: 18  Temp: 98.2 F (36.8 C)  SpO2: 95%    SpO2 Readings from Last 1 Encounters:  08/29/17 95%   Body mass index is 28.91 kg/m.     Physical Exam  GENERAL APPEARANCE: Alert, conversant,  No acute distress.  SKIN: No diaphoresis rash HEAD: Normocephalic, atraumatic  EYES: Conjunctiva/lids clear. Pupils round, reactive. EOMs intact.  EARS: External exam WNL, canals clear. Hearing grossly normal.  NOSE: No deformity or discharge.  MOUTH/THROAT: Lips w/o lesions  RESPIRATORY: Breathing is even, unlabored. Lung sounds are clear   CARDIOVASCULAR: Heart RRR no murmurs, rubs or gallops. No peripheral edema.   GASTROINTESTINAL: Abdomen is soft, non-tender, not distended w/ normal bowel sounds; left upper quadrant colostomy GENITOURINARY: Bladder non tender, not distended   MUSCULOSKELETAL: No abnormal joints or musculature NEUROLOGIC:  Cranial nerves 2-12 grossly intact. Moves all extremities  PSYCHIATRIC: Mood and affect appropriate to situation, no behavioral issues  Patient Active Problem List   Diagnosis Date Noted  . New onset atrial fibrillation ([place])   . Bowel perforation ([place]) 08/18/2017  . Colonic mass 08/18/2017  . Ureteral dilatation 08/18/2017  . Intractable right heel pain 10/07/2016  . Plantar fasciitis of right foot 10/07/2016  . Postoperative nausea and vomiting 01/02/2016  . Hyponatremia 01/01/2016  . Acute hypokalemia 01/01/2016  . Endometrial cancer ([place]) 12/30/2015  . Familial hypercholesteremia 07/28/2015  . Aortic valve stenosis 08/02/2014  . S/P CABG x 3 01/13/2013  . Palpitations 01/13/2013  . Medication intolerance 01/13/2013  . Chronic pain 01/13/2013  . TIA (transient ischemic attack) 01/13/2013  . Carotid stenosis 01/13/2013  . Hypothyroid 10/05/2010  . Essential hypertension 10/05/2010  . Gastroesophagitis 10/05/2010  . Atrophic gastritis 10/05/2010      Labs reviewed: Basic Metabolic Panel:    Component Value Date/Time   NA 136 08/26/2017 0359   NA 139 11/25/2016 1021   K 3.8 08/26/2017 0359   CL 99 (L) 08/26/2017 0359   CO2 29 08/26/2017 0359   GLUCOSE 98 08/26/2017 0359   BUN <5 (L) 08/26/2017 0359   BUN 20 11/25/2016 1021   CREATININE 0.49 08/26/2017 0359   CREATININE 0.69 08/02/2016 1519   CALCIUM 8.7 (L) 08/26/2017 0359   PROT 6.0 (L) 08/26/2017 0359   ALBUMIN 2.4 (L) 08/26/2017 0359   AST 20 08/26/2017 0359   ALT 11 (L) 08/26/2017 0359   ALKPHOS 64 08/26/2017 0359   BILITOT 0.5 08/26/2017 0359   GFRNONAA >60 08/26/2017 0359   GFRAA >60 08/26/2017 0359    Recent Labs    08/19/17 1655  08/24/17 0608 08/25/17 1028 08/26/17 0359  NA 132*   < > 134* 136 136  K 3.2*   < > 3.6 3.3* 3.8  CL 100*   < > 97* 97* 99*  CO2 23   < > 27 27 29   GLUCOSE 91   < > 98 108* 98  BUN <5*   < > <5* <5*  <5*  CREATININE 0.58   < > 0.54 0.44 0.49  CALCIUM 7.4*   < > 8.7* 8.8* 8.7*  MG 1.8   < > 1.7 1.4* 1.8  PHOS 1.9*  --   --  2.1* 3.4   < > =  values in this interval not displayed.   Liver Function Tests: Recent Labs    08/19/17 0337 08/25/17 1028 08/26/17 0359  AST 23 17 20   ALT 11* 9* 11*  ALKPHOS 79 64 64  BILITOT 0.6 0.9 0.5  PROT 6.1* 5.9* 6.0*  ALBUMIN 2.9* 2.5* 2.4*   Recent Labs    08/18/17 1142  LIPASE 21   No results for input(s): AMMONIA in the last 8760 hours. CBC: Recent Labs    08/24/17 0608 08/25/17 1028 08/26/17 0359  WBC 7.8 7.3 6.9  NEUTROABS 6.2 6.1 4.8  HGB 11.5* 11.2* 11.0*  HCT 36.1 35.7* 33.9*  MCV 85.3 85.6 84.3  PLT 321 342 333   Lipid No results for input(s): CHOL, HDL, LDLCALC, TRIG in the last 8760 hours.  Cardiac Enzymes: Recent Labs    08/19/17 1655 08/20/17 0015 08/20/17 0435  TROPONINI 0.56* 0.45* 0.28*   BNP: No results for input(s): BNP in the last 8760 hours. No results found for: MICROALBUR No results found for: HGBA1C No results found for: TSH No results found for: VITAMINB12 No results found for: FOLATE No results found for: IRON, TIBC, FERRITIN  Imaging and Procedures obtained prior to SNF admission: [place] Ekg Site Rite  Result Date: 08/19/2017 If Site Rite image not attached, placement could not be confirmed due to current cardiac rhythm.  Ct Angio Abd/pel W And/or Wo Contrast  Result Date: 08/18/2017 CLINICAL DATA:  Vomiting.  Abdominal pain. EXAM: CTA ABDOMEN AND PELVIS wITHOUT AND WITH CONTRAST TECHNIQUE: Multidetector CT imaging of the abdomen and pelvis was performed using the standard protocol during bolus administration of intravenous contrast. Multiplanar reconstructed images and MIPs were obtained and reviewed to evaluate the vascular anatomy. CONTRAST:  15m ISOVUE-370 IOPAMIDOL (ISOVUE-370) INJECTION 76% COMPARISON:  04/15/2016 FINDINGS: VASCULAR Aorta: There are atherosclerotic calcifications and  irregular plaque in the abdominal aorta without aneurysmal dilatation or significant luminal narrowing. There is a penetrating ulcer in the right posterolateral aorta at the level of the renal arteries. Celiac: Patent. SMA: Patent. Renals: Single right and 2 left renal arteries are patent. IMA: Patent. Inflow: Diffuse atherosclerotic calcifications in the bilateral common iliac arteries without significant narrowing. Internal and external iliac arteries are patent with scattered atherosclerotic changes. Proximal Outflow: Grossly patent. Review of the MIP images confirms the above findings. NON-VASCULAR Lower chest: Dependent atelectasis. Hepatobiliary: Diffuse hepatic steatosis. Gallbladder is distended. A few small layering gallstones are noted Pancreas: Unremarkable Spleen: Unremarkable Adrenals/Urinary Tract: Moderate left hydronephrosis. Left hydroureter. Simple cyst in the right kidney. Adrenal glands are within normal limits. The left ureter is dilated to the left hemipelvis. It becomes narrowed in the inflammatory pelvic mass. This is described below. Bladder is within normal limits. Stomach/Bowel: Large hiatal hernia is unchanged. Stable prominent gastric folds within the hiatal hernia. The colon is diffusely distended. Distal small bowel is distended. A transition point between dilated large bowel and decompressed large bowel occurs in the sigmoid colon. There is a heterogeneous ill-defined mass involving the sigmoid colon and left side of the pelvis which includes the colon wall, adjacent fat, and both fluid and gas elements. Findings are suspicious for a focal perforation with abscess formation. Underlying malignancy is a strong consideration given the appearance and colon obstruction. The mass also involves the left ureter causing an element of left ureteral dilatation worrisome for an element of left ureteral obstruction. The gas and fluid collection is very small measuring 1.8 x 1.2 cm on image 109 of  series 4. Overall mass  size including the involved colon is 5.7 x 5.3 cm. Normal appendix. Lymphatic: Several left iliac and left lower quadrant mesenteric lymph nodes are identified. The largest on image 92 measures 0.9 cm in short axis diameter. Reproductive: Post hysterectomy. The right ovary is not identified. A mass is present in the left adnexa as described above. Other: Noncontributory. Musculoskeletal: No vertebral compression. IMPRESSION: VASCULAR Atherosclerotic changes without obstruction. NON-VASCULAR There is a mass in the left hemipelvis involving the sigmoid colon and left pelvic soft tissues resulting in colon obstruction and left ureteral obstruction. The mass does contain a focal gas and fluid collection consistent with perforation and abscess formation. It is small measuring up to 1.8 cm. Cholelithiasis. Electronically Signed   Ay: Marybelle Killings M.D.   On: 08/18/2017 15:12     Not all labs, radiology exams or other studies done during hospitalization come through on my EPIC note; however they are reviewed by me.    Assessment and Plan  Colonic mass/bowel perforation/abscess-malignancy.  Status post laparoscopic diverting loop transverse colostomy on 3/24; was on Zosyn and able to tolerate some clears so was switched to Augmentin for total of 10 days: Blood cultures show no growth to date times 5 days will need to follow with gastroenterology Dr. Collene Mares as no biopsy was taken of the colonic mass SNF -admitted for OT/PT colostomy care teaching; patient is having no abdominal pain and ask [place] to schedule her Tylenol every 6 hours and to please schedule her tramadol 50 mg every 12 hours as she takes it at home for her left hip bursitis  Ureteral dilatation/hydronephrosis from colonic mass- status post cystoscopy, left retrograde pyelogram with left JJ stent placement on 3/24; patient had Foley but was DC'd because the hematuria was felt to be due to the stent irritation; urology will arrange  outpatient follow-up in 3 months for stent exchange  Paroxysmal atrial fibrillation RVR-new onset-initially treated with IV amiodarone and transition to diltiazem 240 mg daily; no anticoagulation given at this moment secondary to transient episode in setting of acute bowel process-this is per cardiology; echo with LVEF of 65 to 70%, mild LVH no regional wall abnormalities  Hypokalemia/hypomagnesemia/hypophosphatemia/hyponatremia- repleted and improved SNF -CMP  Acute blood loss anemia/postop/anemia of chronic disease- patient was transfused 2 units of PRBCs during surgery; DC hemoglobin 11.0; recommend transfusion for hemoglobin less than 8 given coronary artery disease SNF -follow-up CBC  Hypertension- blood pressure was considered acceptable at 154/88 in the hospital while on Avapro and diltiazem with IV hydralazine for systolic blood pressure greater than 320 SNF -systolic blood pressure at skilled nursing facility is running anywhere from the 120s to the 180s; PRN hydralazine at a skilled nursing facility does not really work well after reviewing all blood pressures and pulses have decided to increase patient's diltiazem to 300 mg daily up from 240; will monitor rsponse  Left leg swelling- negative for DVT SNF -continue to monitor  Hypothyroidism SNF -not stated as uncontrolled; continue Synthroid 50 mcg daily  GERD SNF -stable; continue Protonix 40 mg daily   Time spent greater than 45 minutes;> 50% of time with patient was spent reviewing records, labs, tests and studies, counseling and developing plan of care  Webb Silversmith D. Shannon Coil, MD

## 2017-08-30 ENCOUNTER — Encounter: Payer: Self-pay | Admitting: Internal Medicine

## 2017-08-30 DIAGNOSIS — T83122D Displacement of urinary stent, subsequent encounter: Secondary | ICD-10-CM | POA: Insufficient documentation

## 2017-08-30 DIAGNOSIS — D62 Acute posthemorrhagic anemia: Secondary | ICD-10-CM | POA: Insufficient documentation

## 2017-08-30 DIAGNOSIS — Z933 Colostomy status: Secondary | ICD-10-CM | POA: Insufficient documentation

## 2017-08-30 DIAGNOSIS — K219 Gastro-esophageal reflux disease without esophagitis: Secondary | ICD-10-CM | POA: Insufficient documentation

## 2017-08-30 DIAGNOSIS — D649 Anemia, unspecified: Secondary | ICD-10-CM | POA: Insufficient documentation

## 2017-08-30 DIAGNOSIS — I4891 Unspecified atrial fibrillation: Secondary | ICD-10-CM | POA: Insufficient documentation

## 2017-08-30 DIAGNOSIS — M7989 Other specified soft tissue disorders: Secondary | ICD-10-CM | POA: Insufficient documentation

## 2017-09-08 LAB — BASIC METABOLIC PANEL
BUN: 8 (ref 4–21)
Creatinine: 0.6 (ref 0.5–1.1)
Glucose: 111
Potassium: 4.1 (ref 3.4–5.3)
Sodium: 136 — AB (ref 137–147)

## 2017-09-08 LAB — CBC AND DIFFERENTIAL
HCT: 32 — AB (ref 36–46)
Hemoglobin: 10.8 — AB (ref 12.0–16.0)
Neutrophils Absolute: 7
Platelets: 473 — AB (ref 150–399)
WBC: 8.5

## 2017-09-13 ENCOUNTER — Non-Acute Institutional Stay (SKILLED_NURSING_FACILITY): Payer: Medicare Other | Admitting: Internal Medicine

## 2017-09-13 ENCOUNTER — Encounter: Payer: Self-pay | Admitting: Internal Medicine

## 2017-09-13 DIAGNOSIS — D649 Anemia, unspecified: Secondary | ICD-10-CM | POA: Diagnosis not present

## 2017-09-13 DIAGNOSIS — Z933 Colostomy status: Secondary | ICD-10-CM | POA: Diagnosis not present

## 2017-09-13 DIAGNOSIS — K639 Disease of intestine, unspecified: Secondary | ICD-10-CM | POA: Diagnosis not present

## 2017-09-13 DIAGNOSIS — E871 Hypo-osmolality and hyponatremia: Secondary | ICD-10-CM

## 2017-09-13 DIAGNOSIS — I10 Essential (primary) hypertension: Secondary | ICD-10-CM | POA: Diagnosis not present

## 2017-09-13 DIAGNOSIS — I4891 Unspecified atrial fibrillation: Secondary | ICD-10-CM

## 2017-09-13 DIAGNOSIS — K6389 Other specified diseases of intestine: Secondary | ICD-10-CM

## 2017-09-13 NOTE — Progress Notes (Signed)
Location:   [facility] Room Number: 831/D Place of Service:  [phone])  Provider: Granville Lewis  PCP: [name], MD Patient Care Team: [name], MD as PCP - General (Endocrinology) Debara Pickett Nadean Corwin, MD as PCP - Cardiology (Cardiology)  Extended Emergency Contact Information Primary Emergency Contact: Martell of [place] Work Phone: [phone] Mobile Phone: [phone] Relation: Son Secondary Emergency Contact: Jeffers,Angela Address: [place]          Raelene Bott of [place] Phone: [phone] Work Phone: [phone] Relation: Daughter  Code Status: Full Code Goals of care:  Advanced Directive information Advanced Directives 09/13/2017  Does Patient Have a Medical Advance Directive? Yes  Type of Advance Directive (No Data)  Does patient want to make changes to medical advance directive? No - Patient declined  Would patient like information on creating a medical advance directive? No - Patient declined     Allergies  Allergen Reactions  . Statins Other (See Comments)    Myalgias and memory problems  . Ciprofloxacin Itching    Splotchy redness with itching during IV infusion localized to arm.    Chief Complaint  Patient presents with  . Discharge Note    Discharge Visit    HPI:  77 y.o. female seen today for discharge from facility later this week. She is been admitted here for therapy after hospitalization for colonic mass. Patient has a history of coronary artery disease with CABG-as well as endometrial cancer status post total hysterectomy also has a history of thyroidism.  She presented to hospital with abdominal pain nausea and vomiting.  And some blood in her stool.  She was found to be hyponatremic and hypokalemic blood pressure was elevated.  Also had a brief episode of atrial fibrillation.  CT scan of the abdomen showed an ill-defined mass involving the  sigmoid colon in the left pelvis with suspicion for perforation and abscess formation.  Mass also compresses the left ureter causing dilation.  He underwent JJ stenting and a laparoscopic diverting loop transverse colostomy on March 24.  Cardiology was consulted and heart rate did convert to normal sinus rhythm.  A. fib was thought to be secondary to the acute abdominal issue.  She will need a colonoscopy.  For biopsy of the mass.  She has received therapy here and has done well she is now walking without assistance but would benefit from continued PT and OT.  He will be going home she lives alone but does have strong family support and is quite independent she will need a rolling walker to assist with ambulation as well as nursing support for colostomy.  Nursing does not report any issues she did report some possibly bloody mucoid discharge from her rectum she said this occurred in the hospital before discharge as well-and GI apparently has been notified of this for any possible follow-up.  Clinically she is not really complaining of any discomfort.        Past Medical History:  Diagnosis Date  . Arthritis   . Asthma    allergy induced  . Bilateral carotid artery disease (HCC)    L carotid bruit  . CAD (coronary artery disease)   . Cancer ([place])   . Dyslipidemia    intolerant to statins, welchol, niacin, zetia  . History of blood transfusion   . History of nuclear stress test 04/24/2012   lexiscan; normal study  . Hypertension   . Hypothyroidism   .  Postoperative nausea and vomiting 01/02/2016    Past Surgical History:  Procedure Laterality Date  . ABDOMINAL HYSTERECTOMY    . Carotid Doppler  02/2012   40-59% right int carotid artery stenosis; 60-79% L int carotid stenosis; L carotid bruit  . CORONARY ARTERY BYPASS GRAFT  03/12/2004   LIMA to LAD, SVG to circumflex, SVG to PDA  . CYSTOSCOPY WITH STENT PLACEMENT Bilateral 08/21/2017   Procedure: CYSTOSCOPY WITH STENT  PLACEMENT;  Surgeon: Ceasar Mons, MD;  Location: [facility];  Service: Urology;  Laterality: Bilateral;  . IR GENERIC HISTORICAL  04/29/2016   IR RADIOLOGIST EVAL & MGMT 04/29/2016 Sandi Mariscal, MD [facility] INTERV RAD  . IR GENERIC HISTORICAL  05/12/2016   IR RADIOLOGIST EVAL & MGMT 05/12/2016 Sandi Mariscal, MD [facility] INTERV RAD  . ROBOTIC ASSISTED TOTAL HYSTERECTOMY WITH BILATERAL SALPINGO OOPHERECTOMY Bilateral 12/30/2015   Procedure: XI ROBOTIC ASSISTED TOTAL HYSTERECTOMY WITH BILATERAL SALPINGO OOPHORECTOMY WITH SENTAL LYMPH NODE BIOPSY;  Surgeon: Nancy Marus, MD;  Location: [facility];  Service: Gynecology;  Laterality: Bilateral;  . TONSILLECTOMY    . TRANSTHORACIC ECHOCARDIOGRAM  04/2007   EF>55%; mild MR; mild-mod TR; mild pulm HTN; mild calcification of aortiv valve leaflets with mild valvular aortic stenosis  . TUBAL LIGATION        reports that she has never smoked. She has never used smokeless tobacco. She reports that she does not drink alcohol or use drugs. Social History   Socioeconomic History  . Marital status: Widowed    Spouse name: Not on file  . Number of children: 2  . Years of education: Not on file  . Highest education level: Not on file  Occupational History    Employer: RETIRED  Social Needs  . Financial resource strain: Not on file  . Food insecurity:    Worry: Not on file    Inability: Not on file  . Transportation needs:    Medical: Not on file    Non-medical: Not on file  Tobacco Use  . Smoking status: Never Smoker  . Smokeless tobacco: Never Used  Substance and Sexual Activity  . Alcohol use: No  . Drug use: No  . Sexual activity: Not on file  Lifestyle  . Physical activity:    Days per week: Not on file    Minutes per session: Not on file  . Stress: Not on file  Relationships  . Social connections:    Talks on phone: Not on file    Gets together: Not on file    Attends religious service: Not on file    Active member of club or  organization: Not on file    Attends meetings of clubs or organizations: Not on file    Relationship status: Not on file  . Intimate partner violence:    Fear of current or ex partner: Not on file    Emotionally abused: Not on file    Physically abused: Not on file    Forced sexual activity: Not on file  Other Topics Concern  . Not on file  Social History Narrative  . Not on file   Functional Status Survey:    Allergies  Allergen Reactions  . Statins Other (See Comments)    Myalgias and memory problems  . Ciprofloxacin Itching    Splotchy redness with itching during IV infusion localized to arm.    Pertinent  Health Maintenance Due  Topic Date Due  . DEXA SCAN  10/13/2017 (Originally 11/01/2006)  . COLONOSCOPY  10/13/2017 (Originally 11/01/1991)  . PNA vac Low Risk Adult (1 of 2 - PCV13) 10/13/2017 (Originally 11/01/2006)  . INFLUENZA VACCINE  12/29/2017    Medications: Allergies as of 09/13/2017      Reactions   Statins Other (See Comments)   Myalgias and memory problems   Ciprofloxacin Itching   Splotchy redness with itching during IV infusion localized to arm.      Medication List        Accurate as of 09/13/17  2:32 PM. Always use your most recent med list.          acetaminophen 500 MG tablet Commonly known as:  TYLENOL Take 500 mg by mouth every 6 (six) hours as needed for mild pain, moderate pain, fever or headache.   diltiazem 300 MG 24 hr capsule Commonly known as:  TIAZAC Take 300 mg by mouth daily.   levothyroxine 50 MCG tablet Commonly known as:  SYNTHROID, LEVOTHROID Take 50 mcg by mouth daily before breakfast.   losartan 100 MG tablet Commonly known as:  COZAAR Take 100 mg by mouth daily.   pantoprazole 40 MG tablet Commonly known as:  PROTONIX Take 1 tablet (40 mg total) by mouth daily.   polyethylene glycol packet Commonly known as:  MIRALAX / GLYCOLAX Take 17 g by mouth daily as needed for mild constipation.   traMADol 50 MG  tablet Commonly known as:  ULTRAM Take 1 tablet (50 mg total) by mouth every 12 (twelve) hours as needed for moderate pain.       Review of Systems  General she is not complaining of any fever chills she is looking forward to going home.  Skin is not complain of rashes or itching.  Head ears eyes nose mouth and throat she has prescription lenses is not complain of visual changes or difficulty swallowing.  Respiratory does not complain of shortness of breath or cough at this time.  Cardiac is not negative chest pain has some lower extremity edema.  Feels this may be partly caused by her diet which has been liberalized apparently trying to get her to eat and gainsome strength  GI is not currently complaining of abdominal discomfort since she had a little bit of nausea this morning that was apparently relieved with ginger ale does not complain of vomiting she has a moderate amount of stool in colostomy bag  Musculoskeletal does have a history of left hip pain but this appears to be controlled with the Tylenol.  Neurologic is not complaining of dizziness headache or syncope.  Psych does not complain of being overtly anxious or depressed she is looking forward to going home    Vitals:   09/13/17 1421  BP: (!) 142/62  Pulse: 67  Resp: 18  Temp: 98.3 F (36.8 C)  TempSrc: Oral    Physical Exam   In general this is a pleasant elderly female in no distress resting comfortably in bed.  Her skin is warm and dry.  Eyes visual acuity appears grossly intact she has prescription lenses sclera conjunctive are clear.  Oropharynx is clear mucous membranes moist.  Chest is clear to auscultation there is no labored breathing.  Heart is regular rate and rhythm without murmur gallop or rub she does have moderate lower extremity edema bilaterally.  Abdomen is soft nontender with active bowel sounds colostomy bag has a moderate amount of brown stool in it I do not see overt  blood.  Musculoskeletal is able to move all extremities x4 she is  ambulatory but would benefit from a walker --with some debility continuing.  Neurologic appears grossly intact her speech is clear no lateralizing findings.  Psych she is alert and oriented very pleasant and appropriate  Labs reviewed: Basic Metabolic Panel: Recent Labs    08/19/17 1655  08/24/17 0608 08/25/17 1028 08/26/17 0359 09/08/17  NA 132*   < > 134* 136 136 136*  K 3.2*   < > 3.6 3.3* 3.8 4.1  CL 100*   < > 97* 97* 99*  --   CO2 23   < > 27 27 29   --   GLUCOSE 91   < > 98 108* 98  --   BUN <5*   < > <5* <5* <5* 8  CREATININE 0.58   < > 0.54 0.44 0.49 0.6  CALCIUM 7.4*   < > 8.7* 8.8* 8.7*  --   MG 1.8   < > 1.7 1.4* 1.8  --   PHOS 1.9*  --   --  2.1* 3.4  --    < > = values in this interval not displayed.   Liver Function Tests: Recent Labs    08/19/17 0337 08/25/17 1028 08/26/17 0359  AST 23 17 20   ALT 11* 9* 11*  ALKPHOS 79 64 64  BILITOT 0.6 0.9 0.5  PROT 6.1* 5.9* 6.0*  ALBUMIN 2.9* 2.5* 2.4*   Recent Labs    08/18/17 1142  LIPASE 21   No results for input(s): AMMONIA in the last 8760 hours. CBC: Recent Labs    08/24/17 0608 08/25/17 1028 08/26/17 0359 09/08/17  WBC 7.8 7.3 6.9 8.5  NEUTROABS 6.2 6.1 4.8 7  HGB 11.5* 11.2* 11.0* 10.8*  HCT 36.1 35.7* 33.9* 32*  MCV 85.3 85.6 84.3  --   PLT 321 342 333 473*   Cardiac Enzymes: Recent Labs    08/19/17 1655 08/20/17 0015 08/20/17 0435  TROPONINI 0.56* 0.45* 0.28*   BNP: Invalid input(s): POCBNP CBG: No results for input(s): GLUCAP in the last 8760 hours.  Procedures and Imaging Studies During Stay: Dg C-arm 1-60 Min-no Report  Result Date: 08/21/2017 Fluoroscopy was utilized by the requesting physician.  No radiographic interpretation.   [place] Ekg Site Rite  Result Date: 08/19/2017 If Site Rite image not attached, placement could not be confirmed due to current cardiac rhythm.  Ct Angio Abd/pel W And/or Wo  Contrast  Result Date: 08/18/2017 CLINICAL DATA:  Vomiting.  Abdominal pain. EXAM: CTA ABDOMEN AND PELVIS wITHOUT AND WITH CONTRAST TECHNIQUE: Multidetector CT imaging of the abdomen and pelvis was performed using the standard protocol during bolus administration of intravenous contrast. Multiplanar reconstructed images and MIPs were obtained and reviewed to evaluate the vascular anatomy. CONTRAST:  12mL ISOVUE-370 IOPAMIDOL (ISOVUE-370) INJECTION 76% COMPARISON:  04/15/2016 FINDINGS: VASCULAR Aorta: There are atherosclerotic calcifications and irregular plaque in the abdominal aorta without aneurysmal dilatation or significant luminal narrowing. There is a penetrating ulcer in the right posterolateral aorta at the level of the renal arteries. Celiac: Patent. SMA: Patent. Renals: Single right and 2 left renal arteries are patent. IMA: Patent. Inflow: Diffuse atherosclerotic calcifications in the bilateral common iliac arteries without significant narrowing. Internal and external iliac arteries are patent with scattered atherosclerotic changes. Proximal Outflow: Grossly patent. Review of the MIP images confirms the above findings. NON-VASCULAR Lower chest: Dependent atelectasis. Hepatobiliary: Diffuse hepatic steatosis. Gallbladder is distended. A few small layering gallstones are noted Pancreas: Unremarkable Spleen: Unremarkable Adrenals/Urinary Tract: Moderate left hydronephrosis. Left hydroureter. Simple cyst in the  right kidney. Adrenal glands are within normal limits. The left ureter is dilated to the left hemipelvis. It becomes narrowed in the inflammatory pelvic mass. This is described below. Bladder is within normal limits. Stomach/Bowel: Large hiatal hernia is unchanged. Stable prominent gastric folds within the hiatal hernia. The colon is diffusely distended. Distal small bowel is distended. A transition point between dilated large bowel and decompressed large bowel occurs in the sigmoid colon. There is  a heterogeneous ill-defined mass involving the sigmoid colon and left side of the pelvis which includes the colon wall, adjacent fat, and both fluid and gas elements. Findings are suspicious for a focal perforation with abscess formation. Underlying malignancy is a strong consideration given the appearance and colon obstruction. The mass also involves the left ureter causing an element of left ureteral dilatation worrisome for an element of left ureteral obstruction. The gas and fluid collection is very small measuring 1.8 x 1.2 cm on image 109 of series 4. Overall mass size including the involved colon is 5.7 x 5.3 cm. Normal appendix. Lymphatic: Several left iliac and left lower quadrant mesenteric lymph nodes are identified. The largest on image 92 measures 0.9 cm in short axis diameter. Reproductive: Post hysterectomy. The right ovary is not identified. A mass is present in the left adnexa as described above. Other: Noncontributory. Musculoskeletal: No vertebral compression. IMPRESSION: VASCULAR Atherosclerotic changes without obstruction. NON-VASCULAR There is a mass in the left hemipelvis involving the sigmoid colon and left pelvic soft tissues resulting in colon obstruction and left ureteral obstruction. The mass does contain a focal gas and fluid collection consistent with perforation and abscess formation. It is small measuring up to 1.8 cm. Cholelithiasis. Electronically Signed   By: Marybelle Killings M.D.   On: 08/18/2017 15:12    Assessment/Plan:   : #1- history of colonic mass bowel perforation with abscess malignancy-she is status post laparoscopic diverting loop transverse colectomy-has completed her antibiotics blood cultures were negative- she will have followed by gastroenterology and apparently a biopsy will be obtained of the colonic mass.  She is doing well with her therapy here-she will be going home will need strong nursing support with colostomy care-pain appears to be controlled with the  Tylenol and tramadol he also received the tramadol for her hip discomfort.  2.  History of urethral dilation hydronephrosis from colonic mass-status post cystoscopy with a left retrograde pyelogram with left JJ stent placement-urology will be following up on this for stent exchange.  3.  Atrial fibrillation thought to be transitory with her acute abdominal issues- she had been on amiodarone and this was transitioned to diltiazem-no anticoagulation was pursued secondary thought to be a transient this was evaluated by cardiology.  4.  History of hypokalemia as well as low magnesium at low phosphorus levels as well as hyponatremia-this was repleted and improved-we will check an updated metabolic panel.  5.  History of acute blood loss anemia postop with anemia of chronic disease she did receive a transfusion in the hospital discharge hemoglobin in the hospital was 11 this appears to be stable with most recent hemoglobin 10.8 will update this as well but this appears to be stabilized  #6 history of hypertension blood pressure was somewhat elevated in the hospital while on Avapro and diltiazem as well as IV hydralazine- systolic here appears to be variable per nursing I see listed readings from the 140s-160s-this apparently goes down per nursing when she receives her meds -diltiazem was increased she also continues on Avapro- would  be hesitant to increase medications right before discharge for concerns of hypotension-will await primary care input   #7- history of left leg swelling she does have some edema it was negative for DVT at this point will monitor will need primary care follow-up clinically she appears stable.  8.  History of hypothyroidism she is on Synthroid will defer to primary care provider for aggressive follow-up of this.  9.  GERD she is on Protonix 40 mg a day and this appears to be relatively stable.  Again she will be going home she will need home health care with colostomy  management-as well as PT and OT for further strengthening and a rolling walker to assist with ambulation.  Also she will need expedient GI follow-up apparently has been arranged.  [id number] note greater than 30 minutes spent on this discharge summary-greater than 50% of time spent coordinating care for numerous diagnoses

## 2017-09-29 ENCOUNTER — Encounter (HOSPITAL_COMMUNITY): Payer: Self-pay | Admitting: *Deleted

## 2017-09-29 DIAGNOSIS — E860 Dehydration: Secondary | ICD-10-CM | POA: Diagnosis not present

## 2017-09-29 DIAGNOSIS — K625 Hemorrhage of anus and rectum: Secondary | ICD-10-CM | POA: Diagnosis not present

## 2017-09-29 DIAGNOSIS — Z9889 Other specified postprocedural states: Secondary | ICD-10-CM | POA: Diagnosis not present

## 2017-09-29 DIAGNOSIS — R110 Nausea: Secondary | ICD-10-CM | POA: Diagnosis present

## 2017-09-29 MED ORDER — ONDANSETRON 4 MG PO TBDP
4.0000 mg | ORAL_TABLET | Freq: Once | ORAL | Status: AC | PRN
  Administered 2017-09-30: 4 mg via ORAL
  Filled 2017-09-29: qty 1

## 2017-09-29 NOTE — ED Triage Notes (Signed)
Pt had colon surgery x 1 month ago.   C/o nausea, stated "I have stool coming from my rectum and my colostomy.  It's bloody looking."

## 2017-09-30 ENCOUNTER — Emergency Department (HOSPITAL_COMMUNITY)
Admission: EM | Admit: 2017-09-30 | Discharge: 2017-09-30 | Disposition: A | Discharge status: Home / Self Care | Payer: Medicare Other | Attending: Emergency Medicine | Admitting: Emergency Medicine

## 2017-09-30 DIAGNOSIS — K625 Hemorrhage of anus and rectum: Secondary | ICD-10-CM

## 2017-09-30 DIAGNOSIS — E860 Dehydration: Secondary | ICD-10-CM

## 2017-09-30 LAB — COMPREHENSIVE METABOLIC PANEL
ALT: 8 U/L — ABNORMAL LOW (ref 14–54)
AST: 15 U/L (ref 15–41)
Albumin: 3.8 g/dL (ref 3.5–5.0)
Alkaline Phosphatase: 82 U/L (ref 38–126)
Anion gap: 11 (ref 5–15)
BUN: 10 mg/dL (ref 6–20)
CO2: 23 mmol/L (ref 22–32)
Calcium: 9.8 mg/dL (ref 8.9–10.3)
Chloride: 104 mmol/L (ref 101–111)
Creatinine, Ser: 0.65 mg/dL (ref 0.44–1.00)
GFR calc Af Amer: >60 mL/min (ref >60)
GFR calc non Af Amer: >60 mL/min (ref >60)
Glucose, Bld: 128 mg/dL — ABNORMAL HIGH (ref 65–99)
Potassium: 3.6 mmol/L (ref 3.5–5.1)
Sodium: 138 mmol/L (ref 135–145)
Total Bilirubin: 0.5 mg/dL (ref 0.3–1.2)
Total Protein: 8.4 g/dL — ABNORMAL HIGH (ref 6.5–8.1)

## 2017-09-30 LAB — URINALYSIS, ROUTINE W REFLEX MICROSCOPIC
Bilirubin Urine: NEGATIVE
Glucose, UA: NEGATIVE mg/dL
Ketones, ur: NEGATIVE mg/dL
Nitrite: NEGATIVE
Protein, ur: NEGATIVE mg/dL
RBC / HPF: >50 RBC/hpf — ABNORMAL HIGH (ref 0–5)
Specific Gravity, Urine: 1.01 (ref 1.005–1.030)
pH: 6 (ref 5.0–8.0)

## 2017-09-30 LAB — CBC
HCT: 31.3 % — ABNORMAL LOW (ref 36.0–46.0)
Hemoglobin: 10.3 g/dL — ABNORMAL LOW (ref 12.0–15.0)
MCH: 27.8 pg (ref 26.0–34.0)
MCHC: 32.9 g/dL (ref 30.0–36.0)
MCV: 84.6 fL (ref 78.0–100.0)
Platelets: 461 10*3/uL — ABNORMAL HIGH (ref 150–400)
RBC: 3.7 MIL/uL — ABNORMAL LOW (ref 3.87–5.11)
RDW: 16.4 % — ABNORMAL HIGH (ref 11.5–15.5)
WBC: 8.7 10*3/uL (ref 4.0–10.5)

## 2017-09-30 LAB — LIPASE, BLOOD: Lipase: 24 U/L (ref 11–51)

## 2017-09-30 MED ORDER — TRAMADOL HCL 50 MG PO TABS
50.0000 mg | ORAL_TABLET | Freq: Once | ORAL | Status: AC
  Administered 2017-09-30: 50 mg via ORAL
  Filled 2017-09-30: qty 1

## 2017-09-30 MED ORDER — ACETAMINOPHEN 325 MG PO TABS
650.0000 mg | ORAL_TABLET | Freq: Once | ORAL | Status: AC
  Administered 2017-09-30: 650 mg via ORAL
  Filled 2017-09-30: qty 2

## 2017-09-30 MED ORDER — ONDANSETRON HCL 4 MG/2ML IJ SOLN
4.0000 mg | Freq: Once | INTRAMUSCULAR | Status: AC
  Administered 2017-09-30: 4 mg via INTRAVENOUS
  Filled 2017-09-30: qty 2

## 2017-09-30 MED ORDER — SODIUM CHLORIDE 0.9 % IV SOLN
INTRAVENOUS | Status: DC
  Administered 2017-09-30: 02:00:00 via INTRAVENOUS

## 2017-09-30 MED ORDER — SODIUM CHLORIDE 0.9 % IV BOLUS
500.0000 mL | Freq: Once | INTRAVENOUS | Status: AC
  Administered 2017-09-30: 500 mL via INTRAVENOUS

## 2017-09-30 NOTE — ED Provider Notes (Signed)
[facility] DEPT Provider Note   CSN: [id number] Arrival date & time: 09/29/17  2234     History   Chief Complaint Chief Complaint  Patient presents with  . Nausea  . Hypertension    HPI Shannon Scott is a 77 y.o. female.  HPI   She presents for evaluation of intermittent problems eating and drinking since surgery, several weeks ago, diverting colostomy for suspected chronic cancer.  She is due to have a biopsy by endoscopy, later today, with plans to be on clear liquids only until 11 AM then due to fleets enema prior to presenting for endoscopy at 2 PM.  She has intermittent bleeding in her stool, since surgery.  She describes the blood as "black."  She states when her PCP checked her hemoglobin level this week it was 10.2.  Currently in the ED she is complaining of pain in her left hip which feels like her bursitis.  She has nausea without vomiting.  She denies fever, chills, weakness or dizziness.  She is here with 2 family members.  There are no other known modifying factors.  Past Medical History:  Diagnosis Date  . Arthritis   . Asthma    allergy induced  . Bilateral carotid artery disease (HCC)    L carotid bruit  . CAD (coronary artery disease)   . Cancer ([place])   . Dyslipidemia    intolerant to statins, welchol, niacin, zetia  . History of blood transfusion   . History of nuclear stress test 04/24/2012   lexiscan; normal study  . Hypertension   . Hypothyroidism   . Postoperative nausea and vomiting 01/02/2016    Patient Active Problem List   Diagnosis Date Noted  . Colostomy status ([place]) 08/30/2017  . Ureteral stent displacement, subsequent encounter 08/30/2017  . Atrial fibrillation with RVR ([place]) 08/30/2017  . Hypophosphatemia 08/30/2017  . Hypomagnesemia 08/30/2017  . Acute blood loss as cause of postoperative anemia 08/30/2017  . Chronic anemia 08/30/2017  . Left leg swelling 08/30/2017  . GERD (gastroesophageal reflux  disease) 08/30/2017  . New onset atrial fibrillation ([place])   . Bowel perforation ([place]) 08/18/2017  . Colonic mass 08/18/2017  . Ureteral dilatation 08/18/2017  . Intractable right heel pain 10/07/2016  . Plantar fasciitis of right foot 10/07/2016  . Postoperative nausea and vomiting 01/02/2016  . Hyponatremia 01/01/2016  . Acute hypokalemia 01/01/2016  . Endometrial cancer ([place]) 12/30/2015  . Familial hypercholesteremia 07/28/2015  . Aortic valve stenosis 08/02/2014  . S/P CABG x 3 01/13/2013  . Palpitations 01/13/2013  . Medication intolerance 01/13/2013  . Chronic pain 01/13/2013  . TIA (transient ischemic attack) 01/13/2013  . Carotid stenosis 01/13/2013  . Hypothyroid 10/05/2010  . Essential hypertension 10/05/2010  . Gastroesophagitis 10/05/2010  . Atrophic gastritis 10/05/2010    Past Surgical History:  Procedure Laterality Date  . ABDOMINAL HYSTERECTOMY    . Carotid Doppler  02/2012   40-59% right int carotid artery stenosis; 60-79% L int carotid stenosis; L carotid bruit  . CORONARY ARTERY BYPASS GRAFT  03/12/2004   LIMA to LAD, SVG to circumflex, SVG to PDA  . CYSTOSCOPY WITH STENT PLACEMENT Bilateral 08/21/2017   Procedure: CYSTOSCOPY WITH STENT PLACEMENT;  Surgeon: Ceasar Mons, MD;  Location: [facility];  Service: Urology;  Laterality: Bilateral;  . IR GENERIC HISTORICAL  04/29/2016   IR RADIOLOGIST EVAL & MGMT 04/29/2016 Sandi Mariscal, MD [facility] INTERV RAD  . IR GENERIC HISTORICAL  05/12/2016   IR RADIOLOGIST EVAL &  MGMT 05/12/2016 Sandi Mariscal, MD [facility] INTERV RAD  . ROBOTIC ASSISTED TOTAL HYSTERECTOMY WITH BILATERAL SALPINGO OOPHERECTOMY Bilateral 12/30/2015   Procedure: XI ROBOTIC ASSISTED TOTAL HYSTERECTOMY WITH BILATERAL SALPINGO OOPHORECTOMY WITH SENTAL LYMPH NODE BIOPSY;  Surgeon: Nancy Marus, MD;  Location: [facility];  Service: Gynecology;  Laterality: Bilateral;  . TONSILLECTOMY    . TRANSTHORACIC ECHOCARDIOGRAM  04/2007   EF>55%; mild MR; mild-mod TR;  mild pulm HTN; mild calcification of aortiv valve leaflets with mild valvular aortic stenosis  . TUBAL LIGATION       OB History   None      Home Medications    Prior to Admission medications   Medication Sig Start Date End Date Taking? Authorizing Provider  acetaminophen (TYLENOL) 500 MG tablet Take 500 mg by mouth every 6 (six) hours as needed for mild pain, moderate pain, fever or headache.   Yes [provider]  diclofenac sodium (VOLTAREN) 1 % GEL Apply 2 g topically 4 (four) times daily.   Yes [provider]  diltiazem (TIAZAC) 300 MG 24 hr capsule Take 300 mg by mouth daily.   Yes [provider]  levothyroxine (SYNTHROID, LEVOTHROID) 50 MCG tablet Take 50 mcg by mouth daily before breakfast.    Yes [provider]  losartan (COZAAR) 100 MG tablet Take 100 mg by mouth daily.   Yes [provider]  pantoprazole (PROTONIX) 40 MG tablet Take 1 tablet (40 mg total) by mouth daily. 08/27/17  Yes Sheikh, Omair Latif, DO  polyethylene glycol (MIRALAX / GLYCOLAX) packet Take 17 g by mouth daily as needed for mild constipation.   Yes [provider]  traMADol (ULTRAM) 50 MG tablet Take 1 tablet (50 mg total) by mouth every 12 (twelve) hours as needed for moderate pain. 08/26/17  Yes Kerney Elbe, DO    Family History Family History  Problem Relation Age of Onset  . Hypertension Mother   . Heart disease Mother        Died in her 73s  . Stroke Father   . Kidney disease Brother   . Heart disease Brother        also HTN, hyperlipidemia  . Heart attack Brother   . Stroke Sister        x2  . Hypertension Sister     Social History Social History   Tobacco Use  . Smoking status: Never Smoker  . Smokeless tobacco: Never Used  Substance Use Topics  . Alcohol use: No  . Drug use: No     Allergies   Statins and Ciprofloxacin   Review of Systems Review of Systems  All other systems reviewed and are  negative.    Physical Exam Updated Vital Signs BP (!) 192/77   Pulse 93   Temp 97.7 F (36.5 C) (Oral)   Resp (!) 23   SpO2 100%   Physical Exam  Constitutional: She is oriented to person, place, and time. She appears well-developed and well-nourished. She appears distressed (She is uncomfortable).  Elderly, frail  HENT:  Head: Normocephalic and atraumatic.  Eyes: Pupils are equal, round, and reactive to light. Conjunctivae and EOM are normal.  Neck: Normal range of motion and phonation normal. Neck supple.  Cardiovascular: Normal rate and regular rhythm.  Pulmonary/Chest: Effort normal and breath sounds normal. No respiratory distress. She exhibits no tenderness.  Abdominal: Soft. She exhibits no distension. There is no tenderness. There is no guarding.  Ostomy left mid abdomen, draining normal-appearing stool.  Musculoskeletal:  Mild left hip tenderness to palpation.  Neurological: She is alert and oriented to person, place, and time. She exhibits normal muscle tone.  Skin: Skin is warm and dry.  Psychiatric: She has a normal mood and affect. Her behavior is normal. Judgment and thought content normal.  Nursing note and vitals reviewed.    ED Treatments / Results  Labs (all labs ordered are listed, but only abnormal results are displayed) Labs Reviewed  COMPREHENSIVE METABOLIC PANEL - Abnormal; Notable for the following components:      Result Value   Glucose, Bld 128 (*)    Total Protein 8.4 (*)    ALT 8 (*)    All other components within normal limits  CBC - Abnormal; Notable for the following components:   RBC 3.70 (*)    Hemoglobin 10.3 (*)    HCT 31.3 (*)    RDW 16.4 (*)    Platelets 461 (*)    All other components within normal limits  URINALYSIS, ROUTINE W REFLEX MICROSCOPIC - Abnormal; Notable for the following components:   Hgb urine dipstick LARGE (*)    Leukocytes, UA TRACE (*)    RBC / HPF >50 (*)    Bacteria, UA RARE (*)    All other components  within normal limits  LIPASE, BLOOD    EKG None  Radiology No results found.  Procedures Procedures (including critical care time)  Medications Ordered in ED Medications  0.9 %  sodium chloride infusion ( Intravenous New Bag/Given 09/30/17 0216)  ondansetron (ZOFRAN-ODT) disintegrating tablet 4 mg (4 mg Oral Given 09/30/17 0046)  acetaminophen (TYLENOL) tablet 650 mg (650 mg Oral Given 09/30/17 0158)  traMADol (ULTRAM) tablet 50 mg (50 mg Oral Given 09/30/17 0158)  sodium chloride 0.9 % bolus 500 mL (0 mLs Intravenous Stopped 09/30/17 0325)  ondansetron (ZOFRAN) injection 4 mg (4 mg Intravenous Given 09/30/17 0206)     Initial Impression / Assessment and Plan / ED Course  I have reviewed the triage vital signs and the nursing notes.  Pertinent labs & imaging results that were available during my care of the patient were reviewed by me and considered in my medical decision making (see chart for details).  Clinical Course as of Oct 01 506  Fri Sep 30, 2017  0133 Abnormal, hemoglobin and RBCs are present.  Increased white cells.  Rare bacteria.  Clean-catch sample.  Urinalysis, Routine w reflex microscopic(!) [EW]  0134 Normal except elevated glucose & total protein, low ALT   [EW]  0134 Normal except hemoglobin low 10.3, decreased 1/2 g/dL since 3 weeks ago.  CBC(!) [EW]    Clinical Course User Index [EW] Daleen Bo, MD     Patient Vitals for the past 24 hrs:  BP Temp Temp src Pulse Resp SpO2  09/30/17 0200 (!) 192/77 - - 93 (!) 23 100 %  09/30/17 0115 (!) 185/63 - - - 17 -  09/29/17 2341 (!) 222/77 97.7 F (36.5 C) Oral 88 20 98 %    4:46 AM Reevaluation with update and discussion. After initial assessment and treatment, an updated evaluation reveals patient feels better after treatment in the ED.  Findings discussed with patient and son, all questions answered. [place] decision making-fatigue and malaise, associated with recent diagnosis of cancer, and  process of evaluation was scheduled endoscopy later today.  Suspect mild dehydration.  No evidence for significant blood loss.  Patient stable for discharge.  Nursing Notes Reviewed/ Care Coordinated Applicable Imaging Reviewed  Interpretation of Laboratory Data incorporated into ED treatment  The patient appears reasonably screened and/or stabilized for discharge and I doubt any other medical condition or other [facility] requiring further screening, evaluation, or treatment in the ED at this time prior to discharge.  Plan: Home Medications-continue current medications; Home Treatments-rest, fluids; return here if the recommended treatment, does not improve the symptoms; Recommended follow up-GI follow-up later today as scheduled for endoscopy.     Final Clinical Impressions(s) / ED Diagnoses   Final diagnoses:  Dehydration  Rectal bleeding    ED Discharge Orders    None       Daleen Bo, MD 09/30/17 [phone]

## 2017-09-30 NOTE — ED Notes (Signed)
Bed: WA18 Expected date:  Expected time:  Means of arrival:  Comments: Triage 2 

## 2017-09-30 NOTE — Discharge Instructions (Addendum)
Continue drinking fluids up until 11 AM today, and use the enemas as directed in preparation for your colonoscopy.  Return here, if needed, for problems.

## 2017-10-03 ENCOUNTER — Inpatient Hospital Stay (HOSPITAL_COMMUNITY)
Admission: EM | Admit: 2017-10-03 | Discharge: 2017-10-16 | DRG: 357 | Disposition: A | Discharge status: Home Health | Payer: Medicare Other | Attending: Internal Medicine | Admitting: Internal Medicine

## 2017-10-03 ENCOUNTER — Observation Stay (HOSPITAL_COMMUNITY): Payer: Medicare Other

## 2017-10-03 ENCOUNTER — Encounter (HOSPITAL_COMMUNITY): Payer: Self-pay | Admitting: *Deleted

## 2017-10-03 ENCOUNTER — Other Ambulatory Visit: Payer: Self-pay | Admitting: Internal Medicine

## 2017-10-03 ENCOUNTER — Other Ambulatory Visit: Payer: Self-pay | Admitting: Gastroenterology

## 2017-10-03 ENCOUNTER — Other Ambulatory Visit: Payer: Self-pay

## 2017-10-03 DIAGNOSIS — K625 Hemorrhage of anus and rectum: Secondary | ICD-10-CM | POA: Diagnosis not present

## 2017-10-03 DIAGNOSIS — Z951 Presence of aortocoronary bypass graft: Secondary | ICD-10-CM

## 2017-10-03 DIAGNOSIS — M1990 Unspecified osteoarthritis, unspecified site: Secondary | ICD-10-CM | POA: Diagnosis present

## 2017-10-03 DIAGNOSIS — Z8542 Personal history of malignant neoplasm of other parts of uterus: Secondary | ICD-10-CM

## 2017-10-03 DIAGNOSIS — J45909 Unspecified asthma, uncomplicated: Secondary | ICD-10-CM | POA: Diagnosis present

## 2017-10-03 DIAGNOSIS — M25552 Pain in left hip: Secondary | ICD-10-CM | POA: Diagnosis not present

## 2017-10-03 DIAGNOSIS — I272 Pulmonary hypertension, unspecified: Secondary | ICD-10-CM | POA: Diagnosis present

## 2017-10-03 DIAGNOSIS — C55 Malignant neoplasm of uterus, part unspecified: Secondary | ICD-10-CM

## 2017-10-03 DIAGNOSIS — Z79890 Hormone replacement therapy (postmenopausal): Secondary | ICD-10-CM

## 2017-10-03 DIAGNOSIS — M7989 Other specified soft tissue disorders: Secondary | ICD-10-CM

## 2017-10-03 DIAGNOSIS — I82492 Acute embolism and thrombosis of other specified deep vein of left lower extremity: Secondary | ICD-10-CM

## 2017-10-03 DIAGNOSIS — Z933 Colostomy status: Secondary | ICD-10-CM

## 2017-10-03 DIAGNOSIS — I10 Essential (primary) hypertension: Secondary | ICD-10-CM | POA: Diagnosis not present

## 2017-10-03 DIAGNOSIS — I2510 Atherosclerotic heart disease of native coronary artery without angina pectoris: Secondary | ICD-10-CM | POA: Diagnosis present

## 2017-10-03 DIAGNOSIS — D649 Anemia, unspecified: Secondary | ICD-10-CM | POA: Diagnosis present

## 2017-10-03 DIAGNOSIS — I083 Combined rheumatic disorders of mitral, aortic and tricuspid valves: Secondary | ICD-10-CM | POA: Diagnosis present

## 2017-10-03 DIAGNOSIS — D62 Acute posthemorrhagic anemia: Secondary | ICD-10-CM

## 2017-10-03 DIAGNOSIS — I4891 Unspecified atrial fibrillation: Secondary | ICD-10-CM

## 2017-10-03 DIAGNOSIS — I82402 Acute embolism and thrombosis of unspecified deep veins of left lower extremity: Secondary | ICD-10-CM

## 2017-10-03 DIAGNOSIS — E785 Hyperlipidemia, unspecified: Secondary | ICD-10-CM | POA: Diagnosis present

## 2017-10-03 DIAGNOSIS — K5730 Diverticulosis of large intestine without perforation or abscess without bleeding: Secondary | ICD-10-CM | POA: Diagnosis present

## 2017-10-03 DIAGNOSIS — Z79899 Other long term (current) drug therapy: Secondary | ICD-10-CM

## 2017-10-03 DIAGNOSIS — E034 Atrophy of thyroid (acquired): Secondary | ICD-10-CM | POA: Diagnosis not present

## 2017-10-03 DIAGNOSIS — C785 Secondary malignant neoplasm of large intestine and rectum: Secondary | ICD-10-CM | POA: Diagnosis not present

## 2017-10-03 DIAGNOSIS — M7072 Other bursitis of hip, left hip: Secondary | ICD-10-CM | POA: Diagnosis present

## 2017-10-03 DIAGNOSIS — Z881 Allergy status to other antibiotic agents status: Secondary | ICD-10-CM

## 2017-10-03 DIAGNOSIS — R531 Weakness: Secondary | ICD-10-CM

## 2017-10-03 DIAGNOSIS — Z90710 Acquired absence of both cervix and uterus: Secondary | ICD-10-CM

## 2017-10-03 DIAGNOSIS — I480 Paroxysmal atrial fibrillation: Secondary | ICD-10-CM | POA: Diagnosis present

## 2017-10-03 DIAGNOSIS — Z888 Allergy status to other drugs, medicaments and biological substances status: Secondary | ICD-10-CM

## 2017-10-03 DIAGNOSIS — Z7189 Other specified counseling: Secondary | ICD-10-CM

## 2017-10-03 DIAGNOSIS — I82812 Embolism and thrombosis of superficial veins of left lower extremities: Secondary | ICD-10-CM | POA: Diagnosis present

## 2017-10-03 DIAGNOSIS — E876 Hypokalemia: Secondary | ICD-10-CM | POA: Diagnosis present

## 2017-10-03 DIAGNOSIS — Z8249 Family history of ischemic heart disease and other diseases of the circulatory system: Secondary | ICD-10-CM

## 2017-10-03 DIAGNOSIS — R1900 Intra-abdominal and pelvic swelling, mass and lump, unspecified site: Secondary | ICD-10-CM

## 2017-10-03 DIAGNOSIS — I82409 Acute embolism and thrombosis of unspecified deep veins of unspecified lower extremity: Secondary | ICD-10-CM

## 2017-10-03 DIAGNOSIS — C541 Malignant neoplasm of endometrium: Secondary | ICD-10-CM

## 2017-10-03 DIAGNOSIS — R627 Adult failure to thrive: Secondary | ICD-10-CM | POA: Diagnosis present

## 2017-10-03 DIAGNOSIS — C799 Secondary malignant neoplasm of unspecified site: Secondary | ICD-10-CM

## 2017-10-03 DIAGNOSIS — E039 Hypothyroidism, unspecified: Secondary | ICD-10-CM | POA: Diagnosis present

## 2017-10-03 DIAGNOSIS — C539 Malignant neoplasm of cervix uteri, unspecified: Secondary | ICD-10-CM

## 2017-10-03 HISTORY — DX: Bursopathy, unspecified: M71.9

## 2017-10-03 HISTORY — DX: Secondary malignant neoplasm of unspecified site: C79.9

## 2017-10-03 HISTORY — DX: Other specified counseling: Z71.89

## 2017-10-03 HISTORY — DX: Malignant neoplasm of cervix uteri, unspecified: C53.9

## 2017-10-03 LAB — COMPREHENSIVE METABOLIC PANEL
ALT: 8 U/L — ABNORMAL LOW (ref 14–54)
AST: 13 U/L — ABNORMAL LOW (ref 15–41)
Albumin: 3.6 g/dL (ref 3.5–5.0)
Alkaline Phosphatase: 79 U/L (ref 38–126)
Anion gap: 10 (ref 5–15)
BUN: 8 mg/dL (ref 6–20)
CO2: 26 mmol/L (ref 22–32)
Calcium: 9.7 mg/dL (ref 8.9–10.3)
Chloride: 102 mmol/L (ref 101–111)
Creatinine, Ser: 0.73 mg/dL (ref 0.44–1.00)
GFR calc Af Amer: >60 mL/min (ref >60)
GFR calc non Af Amer: >60 mL/min (ref >60)
Glucose, Bld: 118 mg/dL — ABNORMAL HIGH (ref 65–99)
Potassium: 3.6 mmol/L (ref 3.5–5.1)
Sodium: 138 mmol/L (ref 135–145)
Total Bilirubin: 0.5 mg/dL (ref 0.3–1.2)
Total Protein: 8.2 g/dL — ABNORMAL HIGH (ref 6.5–8.1)

## 2017-10-03 LAB — CBC
HCT: 32.2 % — ABNORMAL LOW (ref 36.0–46.0)
Hemoglobin: 10.4 g/dL — ABNORMAL LOW (ref 12.0–15.0)
MCH: 27.4 pg (ref 26.0–34.0)
MCHC: 32.3 g/dL (ref 30.0–36.0)
MCV: 85 fL (ref 78.0–100.0)
Platelets: 472 10*3/uL — ABNORMAL HIGH (ref 150–400)
RBC: 3.79 MIL/uL — ABNORMAL LOW (ref 3.87–5.11)
RDW: 16.1 % — ABNORMAL HIGH (ref 11.5–15.5)
WBC: 9.7 10*3/uL (ref 4.0–10.5)

## 2017-10-03 LAB — TSH: TSH: 3.917 u[IU]/mL (ref 0.350–4.500)

## 2017-10-03 LAB — TYPE AND SCREEN
ABO/RH(D): O POS
Antibody Screen: NEGATIVE

## 2017-10-03 MED ORDER — ONDANSETRON HCL 4 MG/2ML IJ SOLN
4.0000 mg | Freq: Four times a day (QID) | INTRAMUSCULAR | Status: DC | PRN
  Administered 2017-10-05 – 2017-10-13 (×5): 4 mg via INTRAVENOUS
  Filled 2017-10-03 (×5): qty 2

## 2017-10-03 MED ORDER — PANTOPRAZOLE SODIUM 40 MG PO TBEC
40.0000 mg | DELAYED_RELEASE_TABLET | Freq: Every day | ORAL | Status: DC
  Administered 2017-10-05 – 2017-10-09 (×5): 40 mg via ORAL
  Filled 2017-10-03 (×5): qty 1

## 2017-10-03 MED ORDER — DILTIAZEM HCL ER BEADS 300 MG PO CP24
300.0000 mg | ORAL_CAPSULE | Freq: Every day | ORAL | Status: DC
  Filled 2017-10-03: qty 1

## 2017-10-03 MED ORDER — LEVOTHYROXINE SODIUM 50 MCG PO TABS
50.0000 ug | ORAL_TABLET | Freq: Every day | ORAL | Status: DC
  Administered 2017-10-05 – 2017-10-16 (×12): 50 ug via ORAL
  Filled 2017-10-03 (×12): qty 1

## 2017-10-03 MED ORDER — HYDROCODONE-ACETAMINOPHEN 5-325 MG PO TABS
1.0000 | ORAL_TABLET | Freq: Four times a day (QID) | ORAL | Status: DC | PRN
  Administered 2017-10-03 – 2017-10-10 (×17): 1 via ORAL
  Filled 2017-10-03 (×17): qty 1

## 2017-10-03 MED ORDER — SODIUM CHLORIDE 0.9 % IV BOLUS
500.0000 mL | Freq: Once | INTRAVENOUS | Status: AC
  Administered 2017-10-03: 500 mL via INTRAVENOUS

## 2017-10-03 MED ORDER — TRAMADOL HCL 50 MG PO TABS
50.0000 mg | ORAL_TABLET | Freq: Once | ORAL | Status: AC
  Administered 2017-10-03: 50 mg via ORAL
  Filled 2017-10-03: qty 1

## 2017-10-03 MED ORDER — POLYETHYLENE GLYCOL 3350 17 G PO PACK
17.0000 g | PACK | Freq: Every day | ORAL | Status: DC | PRN
  Administered 2017-10-06 – 2017-10-12 (×3): 17 g via ORAL
  Filled 2017-10-03 (×3): qty 1

## 2017-10-03 MED ORDER — ALPRAZOLAM 0.5 MG PO TABS
0.5000 mg | ORAL_TABLET | Freq: Two times a day (BID) | ORAL | Status: DC | PRN
  Filled 2017-10-03 (×2): qty 1

## 2017-10-03 MED ORDER — ACETAMINOPHEN 650 MG RE SUPP: 650.0000 mg | Freq: Four times a day (QID) | RECTAL | Status: DC | PRN

## 2017-10-03 MED ORDER — ONDANSETRON HCL 4 MG PO TABS: 4.0000 mg | ORAL_TABLET | Freq: Four times a day (QID) | ORAL | Status: DC | PRN

## 2017-10-03 MED ORDER — HYDRALAZINE HCL 20 MG/ML IJ SOLN
10.0000 mg | INTRAMUSCULAR | Status: DC | PRN
  Administered 2017-10-05: 10 mg via INTRAVENOUS
  Filled 2017-10-03: qty 1

## 2017-10-03 MED ORDER — SODIUM CHLORIDE 0.9 % IV SOLN
INTRAVENOUS | Status: DC
  Administered 2017-10-03 – 2017-10-12 (×10): via INTRAVENOUS

## 2017-10-03 MED ORDER — TRAMADOL HCL 50 MG PO TABS
50.0000 mg | ORAL_TABLET | Freq: Two times a day (BID) | ORAL | Status: DC | PRN
  Administered 2017-10-05 – 2017-10-10 (×7): 50 mg via ORAL
  Filled 2017-10-03 (×9): qty 1

## 2017-10-03 MED ORDER — DILTIAZEM HCL ER COATED BEADS 180 MG PO CP24
300.0000 mg | ORAL_CAPSULE | Freq: Every day | ORAL | Status: DC
  Administered 2017-10-03 – 2017-10-16 (×12): 300 mg via ORAL
  Filled 2017-10-03 (×13): qty 1

## 2017-10-03 MED ORDER — IOPAMIDOL (ISOVUE-300) INJECTION 61%
100.0000 mL | Freq: Once | INTRAVENOUS | Status: AC | PRN
  Administered 2017-10-03: 100 mL via INTRAVENOUS

## 2017-10-03 MED ORDER — DICLOFENAC SODIUM 1 % TD GEL
2.0000 g | Freq: Four times a day (QID) | TRANSDERMAL | Status: DC
  Administered 2017-10-04 – 2017-10-16 (×43): 2 g via TOPICAL
  Filled 2017-10-03: qty 100

## 2017-10-03 MED ORDER — LOSARTAN POTASSIUM 50 MG PO TABS
100.0000 mg | ORAL_TABLET | Freq: Every day | ORAL | Status: DC
  Administered 2017-10-03 – 2017-10-16 (×12): 100 mg via ORAL
  Filled 2017-10-03 (×12): qty 2

## 2017-10-03 MED ORDER — IOPAMIDOL (ISOVUE-300) INJECTION 61%
INTRAVENOUS | Status: AC
  Filled 2017-10-03: qty 100

## 2017-10-03 MED ORDER — ACETAMINOPHEN 325 MG PO TABS: 650.0000 mg | ORAL_TABLET | Freq: Four times a day (QID) | ORAL | Status: DC | PRN

## 2017-10-03 NOTE — ED Provider Notes (Addendum)
[place] DEPT Provider Note   CSN: [id number] Arrival date & time: 10/03/17  1126     History   Chief Complaint Rectal bleeding, malaise  HPI Shannon Scott is a 77 y.o. female.  HPI Pt had surgery 1 month ago associated with a pelvic /colonicmass.  Pt had a diverting colostomy.  She did not have the mass removed.   Pt states since then she continues to have pain in the pelvic region .  She also is having blood from her rectum every since the surgery but it seems to be getting worse and the bleeding is more severe.  She has been seeing her surgeons and She had a flexible sigmoidoscopy on Friday but they did not determine the cause. Pt is concerned because it is taking long to figure out what is going on.  They still dont have a tissue diagnosis. Pt called the surgeon today and was told to come to the ED.  Pt feels like she is getting worse.  PT did not take any of her medications today.  Past Medical History:  Diagnosis Date  . Arthritis   . Asthma    allergy induced  . Bilateral carotid artery disease (HCC)    L carotid bruit  . Bursitis    left hip  . CAD (coronary artery disease)   . Cancer ([place])   . Dyslipidemia    intolerant to statins, welchol, niacin, zetia  . History of blood transfusion   . History of nuclear stress test 04/24/2012   lexiscan; normal study  . Hypertension   . Hypothyroidism   . Postoperative nausea and vomiting 01/02/2016    Patient Active Problem List   Diagnosis Date Noted  . Colostomy status ([place]) 08/30/2017  . Ureteral stent displacement, subsequent encounter 08/30/2017  . Atrial fibrillation with RVR ([place]) 08/30/2017  . Hypophosphatemia 08/30/2017  . Hypomagnesemia 08/30/2017  . Acute blood loss as cause of postoperative anemia 08/30/2017  . Chronic anemia 08/30/2017  . Left leg swelling 08/30/2017  . GERD (gastroesophageal reflux disease) 08/30/2017  . New onset atrial fibrillation ([place])   . Bowel  perforation ([place]) 08/18/2017  . Colonic mass 08/18/2017  . Ureteral dilatation 08/18/2017  . Intractable right heel pain 10/07/2016  . Plantar fasciitis of right foot 10/07/2016  . Postoperative nausea and vomiting 01/02/2016  . Hyponatremia 01/01/2016  . Acute hypokalemia 01/01/2016  . Endometrial cancer ([place]) 12/30/2015  . Familial hypercholesteremia 07/28/2015  . Aortic valve stenosis 08/02/2014  . S/P CABG x 3 01/13/2013  . Palpitations 01/13/2013  . Medication intolerance 01/13/2013  . Chronic pain 01/13/2013  . TIA (transient ischemic attack) 01/13/2013  . Carotid stenosis 01/13/2013  . Hypothyroid 10/05/2010  . Essential hypertension 10/05/2010  . Gastroesophagitis 10/05/2010  . Atrophic gastritis 10/05/2010    Past Surgical History:  Procedure Laterality Date  . ABDOMINAL HYSTERECTOMY    . Carotid Doppler  02/2012   40-59% right int carotid artery stenosis; 60-79% L int carotid stenosis; L carotid bruit  . CORONARY ARTERY BYPASS GRAFT  03/12/2004   LIMA to LAD, SVG to circumflex, SVG to PDA  . CYSTOSCOPY WITH STENT PLACEMENT Bilateral 08/21/2017   Procedure: CYSTOSCOPY WITH STENT PLACEMENT;  Surgeon: Ceasar Mons, MD;  Location: [facility];  Service: Urology;  Laterality: Bilateral;  . IR GENERIC HISTORICAL  04/29/2016   IR RADIOLOGIST EVAL & MGMT 04/29/2016 Sandi Mariscal, MD [facility] INTERV RAD  . IR GENERIC HISTORICAL  05/12/2016   IR RADIOLOGIST EVAL &  MGMT 05/12/2016 Sandi Mariscal, MD [facility] INTERV RAD  . ROBOTIC ASSISTED TOTAL HYSTERECTOMY WITH BILATERAL SALPINGO OOPHERECTOMY Bilateral 12/30/2015   Procedure: XI ROBOTIC ASSISTED TOTAL HYSTERECTOMY WITH BILATERAL SALPINGO OOPHORECTOMY WITH SENTAL LYMPH NODE BIOPSY;  Surgeon: Nancy Marus, MD;  Location: [facility];  Service: Gynecology;  Laterality: Bilateral;  . TONSILLECTOMY    . TRANSTHORACIC ECHOCARDIOGRAM  04/2007   EF>55%; mild MR; mild-mod TR; mild pulm HTN; mild calcification of aortiv valve leaflets with mild  valvular aortic stenosis  . TUBAL LIGATION       OB History   None      Home Medications    Prior to Admission medications   Medication Sig Start Date End Date Taking? Authorizing Provider  acetaminophen (TYLENOL) 500 MG tablet Take 500 mg by mouth every 6 (six) hours as needed for mild pain, moderate pain, fever or headache.   Yes [provider]  ALPRAZolam Duanne Moron) 0.5 MG tablet Take 0.5 mg by mouth 2 (two) times daily. 09/30/17  Yes [provider]  diclofenac sodium (VOLTAREN) 1 % GEL Apply 2 g topically 4 (four) times daily.   Yes [provider]  diltiazem (TIAZAC) 300 MG 24 hr capsule Take 300 mg by mouth daily.   Yes [provider]  levothyroxine (SYNTHROID, LEVOTHROID) 50 MCG tablet Take 50 mcg by mouth daily before breakfast.    Yes [provider]  losartan (COZAAR) 100 MG tablet Take 100 mg by mouth daily.   Yes [provider]  pantoprazole (PROTONIX) 40 MG tablet Take 1 tablet (40 mg total) by mouth daily. 08/27/17  Yes Sheikh, Omair Latif, DO  polyethylene glycol (MIRALAX / GLYCOLAX) packet Take 17 g by mouth daily as needed for mild constipation.   Yes [provider]  traMADol (ULTRAM) 50 MG tablet Take 1 tablet (50 mg total) by mouth every 12 (twelve) hours as needed for moderate pain. 08/26/17  Yes Kerney Elbe, DO    Family History Family History  Problem Relation Age of Onset  . Hypertension Mother   . Heart disease Mother        Died in her 60s  . Stroke Father   . Kidney disease Brother   . Heart disease Brother        also HTN, hyperlipidemia  . Heart attack Brother   . Stroke Sister        x2  . Hypertension Sister     Social History Social History   Tobacco Use  . Smoking status: Never Smoker  . Smokeless tobacco: Never Used  Substance Use Topics  . Alcohol use: No  . Drug use: No     Allergies   Statins and Ciprofloxacin   Review of Systems Review of Systems    Constitutional: Negative for fever.  Gastrointestinal: Negative for vomiting.  All other systems reviewed and are negative.    Physical Exam Updated Vital Signs BP (!) 188/69   Pulse 82   Temp 98.5 F (36.9 C) (Oral)   Resp 20   Ht 1.549 m (5' 1" )   Wt 62.6 kg (138 lb)   SpO2 100%   BMI 26.07 kg/m   Physical Exam  Constitutional: She appears well-developed and well-nourished. No distress.  HENT:  Head: Normocephalic and atraumatic.  Right Ear: External ear normal.  Left Ear: External ear normal.  Eyes: Conjunctivae are normal. Right eye exhibits no discharge. Left eye exhibits no discharge. No scleral icterus.  Neck: Neck supple. No tracheal deviation present.  Cardiovascular: Normal rate, regular rhythm and intact distal pulses.  Pulmonary/Chest: Effort normal and breath sounds normal. No stridor. No respiratory distress. She has no wheezes. She has no rales.  Abdominal: Soft. Bowel sounds are normal. She exhibits no distension. There is no tenderness. There is no rebound and no guarding.  Colostomy bag with brown/green stool, no blood  Musculoskeletal: She exhibits no edema or tenderness.  Neurological: She is alert. She has normal strength. No cranial nerve deficit (no facial droop, extraocular movements intact, no slurred speech) or sensory deficit. She exhibits normal muscle tone. She displays no seizure activity. Coordination normal.  Skin: Skin is warm and dry. No rash noted.  Psychiatric: She has a normal mood and affect.  Nursing note and vitals reviewed.    ED Treatments / Results  Labs (all labs ordered are listed, but only abnormal results are displayed) Labs Reviewed  COMPREHENSIVE METABOLIC PANEL - Abnormal; Notable for the following components:      Result Value   Glucose, Bld 118 (*)    Total Protein 8.2 (*)    AST 13 (*)    ALT 8 (*)    All other components within normal limits  CBC - Abnormal; Notable for the following components:   RBC 3.79 (*)     Hemoglobin 10.4 (*)    HCT 32.2 (*)    RDW 16.1 (*)    Platelets 472 (*)    All other components within normal limits  TYPE AND SCREEN    EKG None  Radiology No results found.  Procedures Procedures (including critical care time)  Medications Ordered in ED Medications  losartan (COZAAR) tablet 100 mg (100 mg Oral Given 10/03/17 1757)  diltiazem (CARDIZEM CD) 24 hr capsule 300 mg (300 mg Oral Given 10/03/17 1757)  sodium chloride 0.9 % bolus 500 mL (0 mLs Intravenous Stopped 10/03/17 1756)     Initial Impression / Assessment and Plan / ED Course  I have reviewed the triage vital signs and the nursing notes.  Pertinent labs & imaging results that were available during my care of the patient were reviewed by me and considered in my medical decision making (see chart for details).  Clinical Course as of Oct 03 1908  Mon Oct 03, 2017  1614 Anemia is stable.  Increased compared  to a few days ago.  Downward trend from one month ago which was around 11.  CBC(!) [JK]  1618 Family requests IV fluids   [JK]  1627 No sig dehydration  Comprehensive metabolic panel(!) [JK]  8850 Previous records reviewed including discharge summary and nursing home notes   [JK]  1828 D/w Dr Hassell Done.  Recommends medical admission. GI consult.  Pt still needs to get a tissue biopsy.   [JK]  1833 Hgb is stable.   [JK]  1833 Electrolytes normal   [JK]  1842 D/w Dr.  Collene Mares.  She called Dr Excell Seltzer.  They planned on possible pelvic MRI and biopsy   [JK]    Clinical Course User Index [JK] Dorie Rank, MD    Patient presents with increasing fatigue and weakness associated with a known abdominal mass.  Patient was in the hospital over a month ago.  At the time, her condition was complicated by a [place] perforation.  She had a diverting colostomy.  Since then the patient has been following up with her gastroenterologist and surgeons.  She had a sigmoidoscopy and Dr. Collene Mares was unable to get a tissue biopsy.   Patient continues to  have rectal bleeding.  I spoke with general surgery and gastroenterology.  They recommend admission to hospital this point to expedite her work-up.  Final Clinical Impressions(s) / ED Diagnoses   Final diagnoses:  Abdominal mass, unspecified abdominal location  Rectal bleeding      Dorie Rank, MD 10/03/17 1911  Discussed imaging with Radiology, CT scan with abdomen and pelvis rather than MRI would be better for guiding biopsy, Dr.  Pershing Proud, MD 10/03/17 2025

## 2017-10-03 NOTE — ED Triage Notes (Signed)
Pt had surgery on 3/24 for a colostomy placement.  Pt has been issues with bleeding since then.  Pt was seen by GI last Tuesday and on Friday, pt had a sigmoidoscopy. Pt reports having 2 to 3 bloody BMs over the past 2-3 days.  Pt has a picture that depicts bright red blood on tissue in the toilet but pt denies that the stool itself or the toilet bowl was bloody.  Pt says that she now also feels weak and has no appetite to eat or drink.  Pt's BP is 196/77 in triage but pt reports not taking her BP medications today.  Pt is able to tolerate PO fluids but has not appetite. Pt a/o x 4 and ambulatory in triage. Hx of bursitis in left hip.

## 2017-10-03 NOTE — ED Notes (Signed)
ED TO INPATIENT HANDOFF REPORT  Name/Age/Gender [name] 77 y.o. female  Code Status    Code Status Orders  (From admission, onward)        Start     Ordered   10/03/17 1935  Full code  Continuous     10/03/17 1936    Code Status History    Date Active Date Inactive Code Status Order ID Comments User Context   08/18/2017 1704 08/26/2017 2310 Full Code [id number]  Shelly Coss, MD ED   12/30/2015 1818 12/31/2015 1832 Full Code [id number]  Lahoma Crocker, MD Inpatient      Home/SNF/Other Home  Chief Complaint rectal bleeding  Level of Care/Admitting Diagnosis ED Disposition    ED Disposition Condition [facility] Area: [facility] [[id number]]  Level of Care: Telemetry [5]  Admit to tele based on following criteria: Monitor QTC interval  Diagnosis: Rectal bleeding [510258]  Admitting Physician: Norval Morton [[id number]]  Attending Physician: Norval Morton [[id number]]  PT Class (Do Not Modify): Observation [104]  PT Acc Code (Do Not Modify): Observation [10022]       Medical History Past Medical History:  Diagnosis Date  . Arthritis   . Asthma    allergy induced  . Bilateral carotid artery disease (HCC)    L carotid bruit  . Bursitis    left hip  . CAD (coronary artery disease)   . Cancer ([place])   . Dyslipidemia    intolerant to statins, welchol, niacin, zetia  . History of blood transfusion   . History of nuclear stress test 04/24/2012   lexiscan; normal study  . Hypertension   . Hypothyroidism   . Postoperative nausea and vomiting 01/02/2016    Allergies Allergies  Allergen Reactions  . Statins Other (See Comments)    Myalgias and memory problems  . Ciprofloxacin Itching    Splotchy redness with itching during IV infusion localized to arm.    IV Location/Drains/Wounds Patient Lines/Drains/Airways Status   Active Line/Drains/Airways    Name:   Placement date:   Placement time:   Site:   Days:    Peripheral IV 10/03/17 Right Antecubital   10/03/17    1634    Antecubital   less than 1   Colostomy LUQ   08/21/17    0949    LUQ   43   Ureteral Drain/Stent Left ureter 6 Fr.   08/21/17    0840    Left ureter   43   Incision (Closed) 12/30/15 Abdomen Other (Comment)   12/30/15    1446     643   Incision (Closed) 08/21/17 Abdomen Other (Comment)   08/21/17    0950     43   Incision - 3 Ports Abdomen 1: Right;Lateral 2: Right;Medial 3: Lower;Mid   08/21/17    0930     43   Incision - 5 Ports Abdomen 1: Right;Lateral 2: Umbilicus 3: Left;Medial 4: Left;Lateral 5: Left;Upper   12/30/15    1355     643          Labs/Imaging Results for orders placed or performed during the hospital encounter of 10/03/17 (from the past 48 hour(s))  Comprehensive metabolic panel     Status: Abnormal   Collection Time: 10/03/17 12:36 PM  Result Value Ref Range   Sodium 138 135 - 145 mmol/L   Potassium 3.6 3.5 - 5.1 mmol/L   Chloride 102 101 - 111 mmol/L  CO2 26 22 - 32 mmol/L   Glucose, Bld 118 (H) 65 - 99 mg/dL   BUN 8 6 - 20 mg/dL   Creatinine, Ser 0.73 0.44 - 1.00 mg/dL   Calcium 9.7 8.9 - 10.3 mg/dL   Total Protein 8.2 (H) 6.5 - 8.1 g/dL   Albumin 3.6 3.5 - 5.0 g/dL   AST 13 (L) 15 - 41 U/L   ALT 8 (L) 14 - 54 U/L   Alkaline Phosphatase 79 38 - 126 U/L   Total Bilirubin 0.5 0.3 - 1.2 mg/dL   GFR calc non Af Amer >60 >60 mL/min   GFR calc Af Amer >60 >60 mL/min    Comment: (NOTE) The eGFR has been calculated using the CKD EPI equation. This calculation has not been validated in all clinical situations. eGFR's persistently <60 mL/min signify possible Chronic Kidney Disease.    Anion gap 10 5 - 15    Comment: Performed at [facility], [place] [address], [place], [place] [address]  CBC     Status: Abnormal   Collection Time: 10/03/17 12:36 PM  Result Value Ref Range   WBC 9.7 4.0 - 10.5 K/uL   RBC 3.79 (L) 3.87 - 5.11 MIL/uL   Hemoglobin 10.4 (L) 12.0 - 15.0 g/dL   HCT 32.2  (L) 36.0 - 46.0 %   MCV 85.0 78.0 - 100.0 fL   MCH 27.4 26.0 - 34.0 pg   MCHC 32.3 30.0 - 36.0 g/dL   RDW 16.1 (H) 11.5 - 15.5 %   Platelets 472 (H) 150 - 400 K/uL    Comment: Performed at [facility], [place] [address] [place], [place] [address]  Type and screen [place]     Status: None   Collection Time: 10/03/17 12:36 PM  Result Value Ref Range   ABO/RH(D) O POS    Antibody Screen NEG    Sample Expiration      10/06/2017 Performed at [facility], [place] [address] [place], [place] [address]   TSH     Status: None   Collection Time: 10/03/17 12:36 PM  Result Value Ref Range   TSH 3.917 0.350 - 4.500 uIU/mL    Comment: Performed by a 3rd Generation assay with a functional sensitivity of <=0.01 uIU/mL. Performed at [facility], [place] [address], [place], [place] [address]    Ct Abdomen Pelvis W Contrast  Result Date: 10/03/2017 CLINICAL DATA:  Rectal bleeding.  Malaise. HPIPt had surgery 1 month ago associated with a pelvic /colonic mass. Pt had a diverting colostomy. She did not have the mass removed. Pt states since then she continues to have pain in the pelvic region . She also is having blood from her rectum ever since the surgery but it seems to be getting worse and the bleeding is more severe. She has been seeing her surgeons and She had a flexible sigmoidoscopy on Friday but they did not determine the cause. Pt is concerned because it is taking long to figure out what is going on. They still dont have a tissue diagnosis. Pt called the surgeon today and was told to come to the ED. Pt feels like she is getting worse. PT did not take any of her medications today. EXAM: CT ABDOMEN AND PELVIS WITH CONTRAST TECHNIQUE: Multidetector CT imaging of the abdomen and pelvis was performed using the standard protocol following bolus administration of intravenous contrast. CONTRAST:  163m ISOVUE-300 IOPAMIDOL (ISOVUE-300)  INJECTION 61%  COMPARISON:  08/18/2017 FINDINGS: Lower chest: 4 mm pleural-based nodular opacity in the lateral left lower lobe, image 3, series 4, above the field of view from the most recent prior study. Present and similar in size on a CT dated 01/14/2016 consistent with a benign nodule. No acute findings. Heart normal in size. Hepatobiliary: No liver masses. Focal fat noted adjacent to the falciform ligament. No other lesions. Liver normal in size. A few small dependent densities are noted in the gallbladder consistent with stones. No wall thickening or inflammation. No bile duct dilation. Pancreas: Unremarkable. No pancreatic ductal dilatation or surrounding inflammatory changes. Spleen: Normal in size without focal abnormality. Adrenals/Urinary Tract: No adrenal masses. There is mild-to-moderate left renal collecting system dilation. A stent extends from the left renal pelvis through the left ureter to the bladder, well positioned. No right hydronephrosis. Small, 11 mm, lower pole renal cyst. No other renal masses. No intrarenal stones. Right ureters normal in course and in caliber. Bladder is deviated by the large pelvic mass, but otherwise unremarkable. Stomach/Bowel: A large heterogeneous mass arises from the sigmoid colon and extends to the pelvic sidewall involving the ileo psoas muscle. Mass measures approximately 6.9 cm from superior to inferior by 7 cm x 5.5 cm transversely. This has increased in size from the prior exam. No other colonic masses. There are adjacent inflammatory changes and several prominent, subcentimeter lymph nodes. Multiple diverticula are noted along the colon, stable. A diverting loop colostomy has been performed in utilizing the mid transverse colon. Stomach shows a masslike area arising from the cardia extending to the gastroesophageal junction similar to the prior exam. This may be from previous hiatal hernia surgery or reflect a prominent fold. It is felt unlikely reflect  neoplastic disease since it is stable when compared to 01/14/2016 CT. Small bowel is unremarkable. Vascular/Lymphatic: There are few prominent to mildly enlarged pelvic and retroperitoneal lymph nodes. Largest measuring 1 cm lying to the left of the aortic bifurcation. This node is increased in size from the prior CT where it measured 6 mm. Reproductive: Uterus is surgically absent. Other: Hazy inflammatory change is noted in the pelvis that has increased from the prior exam. No ascites. Musculoskeletal: No fracture or acute finding. No osteoblastic or osteolytic lesions. IMPRESSION: 1. No acute findings. 2. 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 It extends to involve the left pelvic sidewall, specifically the ileo psoas muscle. There are now inflammatory type changes adjacent to the mass that have increased from prior exam. There are multiple prominent to borderline enlarged lymph nodes several increased in size from the prior exam. No other evidence of metastatic disease. 3. Loop colostomy lies in transverse colon has been performed since the prior study. No evidence of bowel obstruction. 4. There is left hydronephrosis despite a well-positioned left ureteral stent. No right hydronephrosis. 5. Few small gallstones.  Aortic atherosclerosis. 6. Masslike prominence in the gastric cardia is most likely a prominent fold. This could be from previous hiatal hernia surgery if this has been performed in the past. The appearance of this portion of the stomach is stable from prior CTs dating back to August 2017. Electronically Signed   By: Lajean Manes M.D.   On: 10/03/2017 21:33    Pending Labs Unresulted Labs (From admission, onward)   Start     Ordered   10/04/17 0500  CBC  Tomorrow morning,   R     10/03/17 1936   10/04/17 1660  Basic metabolic panel  Tomorrow morning,   R     10/03/17 1936   10/04/17 0500  Phosphorus  Tomorrow morning,   R     10/03/17 2040   10/04/17 0500  Magnesium   Tomorrow morning,   R     10/03/17 2040      Vitals/Pain Today's Vitals   10/03/17 2018 10/03/17 2110 10/03/17 2200 10/03/17 2230  BP:   (!) 160/66 (!) 162/58  Pulse:   75 75  Resp:   19 15  Temp:      TempSrc:      SpO2:   97% 98%  Weight:      Height:      PainSc: 0-No pain 8       Isolation Precautions No active isolations  Medications Medications  losartan (COZAAR) tablet 100 mg (100 mg Oral Given 10/03/17 1757)  diltiazem (CARDIZEM CD) 24 hr capsule 300 mg (300 mg Oral Given 10/03/17 1757)  diclofenac sodium (VOLTAREN) 1 % transdermal gel 2 g (has no administration in time range)  levothyroxine (SYNTHROID, LEVOTHROID) tablet 50 mcg (has no administration in time range)  ALPRAZolam (XANAX) tablet 0.5 mg (has no administration in time range)  pantoprazole (PROTONIX) EC tablet 40 mg (has no administration in time range)  polyethylene glycol (MIRALAX / GLYCOLAX) packet 17 g (has no administration in time range)  traMADol (ULTRAM) tablet 50 mg (has no administration in time range)  ondansetron (ZOFRAN) tablet 4 mg (has no administration in time range)    Or  ondansetron (ZOFRAN) injection 4 mg (has no administration in time range)  acetaminophen (TYLENOL) tablet 650 mg (has no administration in time range)    Or  acetaminophen (TYLENOL) suppository 650 mg (has no administration in time range)  hydrALAZINE (APRESOLINE) injection 10 mg (has no administration in time range)  0.9 %  sodium chloride infusion ( Intravenous New Bag/Given 10/03/17 2216)  HYDROcodone-acetaminophen (NORCO/VICODIN) 5-325 MG per tablet 1 tablet (1 tablet Oral Given 10/03/17 2212)  iopamidol (ISOVUE-300) 61 % injection (has no administration in time range)  sodium chloride 0.9 % bolus 500 mL (0 mLs Intravenous Stopped 10/03/17 1756)  traMADol (ULTRAM) tablet 50 mg (50 mg Oral Given 10/03/17 2212)  iopamidol (ISOVUE-300) 61 % injection 100 mL (100 mLs Intravenous Contrast Given 10/03/17 2050)    Mobility walks

## 2017-10-03 NOTE — ED Notes (Signed)
Patient denies pain and is resting comfortably.  

## 2017-10-03 NOTE — H&P (Signed)
History and Physical    Shannon Scott VHQ:[id number] DOB: [birth year] DOA: 10/03/2017  Referring MD/NP/PA: Dr. Tomi Bamberger PCP: Leeroy Cha, MD  Patient coming from: home  Chief Complaint: Weakness and rectal Bleeding  I have personally briefly reviewed patient's old medical records in [place]   HPI: Shannon Scott is a 77 y.o. female with medical history significant of CAD s/p CABG, endometrial cancer, A. fib with RVR, and  hypothyroidism; who presents with complaints of generalized weakness and continued rectal bleeding.   She just been recently hospitalized 3/21-3/29, after coming in with complaints of abdominal pain with nausea and vomiting.  Patient was found to have a ill-defined heterogeneous mass involving the sigmoid colon of the pelvis with signs of possible perforation and abscess during the hospitalization patient was evaluated by general surgery,  cardiology, urology, and orthopedic surgery.  She underwent JJ stenting and laparoscopic diverting loop colostomy by Dr. Excell Seltzer on 3/24.  She was initially discharged to a rehab facility, but has been home for a while.  Since being hospitalized patient reports still having rectal bleeding.  She will get the urge to have a bowel movement and when she goes notes clots of dark blood present.  Patient going once or twice per day.  She notes that she is just had generalized weakness and felt as though she may pass out.  Denies having any loss of consciousness or recent falls.  Her biggest complaint is continued left-sided hip pain that runs down her leg and into her groin area.  Just 3 days ago, she had a scope by Dr. Collene Mares of gastroenterology, but the mass was not able to be reached.  Patient notes her weakness symptoms were so severe today to the point which she was unable to get around and she came in for further evaluation.  Denies having any significant chest pain, shortness of breath, loss of consciousness, fall,  fever, chills, nausea, or vomiting.  ED Course: Upon admission into the emergency department the patient was found to be 164/104-196/77, and all other vitals within normal limits. Labs revealed WBC 9.7, hemoglobin 10.4, platelets 472 and other labs relatively within normal limits.  General surgery and gastroenterology were notified and recommended admission for observation overnight with likely need of repeat CT scan and interventional radiology.  TRH called to admit.  Review of Systems  Constitutional: Negative for chills and fever.  HENT: Negative for ear discharge and nosebleeds.   Eyes: Negative for photophobia and pain.  Respiratory: Negative for cough, hemoptysis and shortness of breath.   Cardiovascular: Positive for leg swelling. Negative for chest pain.  Gastrointestinal: Positive for blood in stool. Negative for abdominal pain, nausea and vomiting.  Genitourinary: Negative for dysuria and hematuria.  Musculoskeletal: Positive for joint pain.  Skin: Negative for itching and rash.  Neurological: Positive for dizziness and weakness. Negative for loss of consciousness.  Endo/Heme/Allergies: Negative for polydipsia. Bruises/bleeds easily.  Psychiatric/Behavioral: Negative for substance abuse. The patient is not nervous/anxious.     Past Medical History:  Diagnosis Date  . Arthritis   . Asthma    allergy induced  . Bilateral carotid artery disease (HCC)    L carotid bruit  . Bursitis    left hip  . CAD (coronary artery disease)   . Cancer ([place])   . Dyslipidemia    intolerant to statins, welchol, niacin, zetia  . History of blood transfusion   . History of nuclear stress test 04/24/2012   lexiscan; normal study  .  Hypertension   . Hypothyroidism   . Postoperative nausea and vomiting 01/02/2016    Past Surgical History:  Procedure Laterality Date  . ABDOMINAL HYSTERECTOMY    . Carotid Doppler  02/2012   40-59% right int carotid artery stenosis; 60-79% L int carotid  stenosis; L carotid bruit  . CORONARY ARTERY BYPASS GRAFT  03/12/2004   LIMA to LAD, SVG to circumflex, SVG to PDA  . CYSTOSCOPY WITH STENT PLACEMENT Bilateral 08/21/2017   Procedure: CYSTOSCOPY WITH STENT PLACEMENT;  Surgeon: Ceasar Mons, MD;  Location: [facility];  Service: Urology;  Laterality: Bilateral;  . IR GENERIC HISTORICAL  04/29/2016   IR RADIOLOGIST EVAL & MGMT 04/29/2016 Sandi Mariscal, MD [facility] INTERV RAD  . IR GENERIC HISTORICAL  05/12/2016   IR RADIOLOGIST EVAL & MGMT 05/12/2016 Sandi Mariscal, MD [facility] INTERV RAD  . ROBOTIC ASSISTED TOTAL HYSTERECTOMY WITH BILATERAL SALPINGO OOPHERECTOMY Bilateral 12/30/2015   Procedure: XI ROBOTIC ASSISTED TOTAL HYSTERECTOMY WITH BILATERAL SALPINGO OOPHORECTOMY WITH SENTAL LYMPH NODE BIOPSY;  Surgeon: Nancy Marus, MD;  Location: [facility];  Service: Gynecology;  Laterality: Bilateral;  . TONSILLECTOMY    . TRANSTHORACIC ECHOCARDIOGRAM  04/2007   EF>55%; mild MR; mild-mod TR; mild pulm HTN; mild calcification of aortiv valve leaflets with mild valvular aortic stenosis  . TUBAL LIGATION       reports that she has never smoked. She has never used smokeless tobacco. She reports that she does not drink alcohol or use drugs.  Allergies  Allergen Reactions  . Statins Other (See Comments)    Myalgias and memory problems  . Ciprofloxacin Itching    Splotchy redness with itching during IV infusion localized to arm.    Family History  Problem Relation Age of Onset  . Hypertension Mother   . Heart disease Mother        Died in her 43s  . Stroke Father   . Kidney disease Brother   . Heart disease Brother        also HTN, hyperlipidemia  . Heart attack Brother   . Stroke Sister        x2  . Hypertension Sister     Prior to Admission medications   Medication Sig Start Date End Date Taking? Authorizing Provider  acetaminophen (TYLENOL) 500 MG tablet Take 500 mg by mouth every 6 (six) hours as needed for mild pain, moderate pain, fever or  headache.   Yes [provider]  ALPRAZolam Duanne Moron) 0.5 MG tablet Take 0.5 mg by mouth 2 (two) times daily. 09/30/17  Yes [provider]  diclofenac sodium (VOLTAREN) 1 % GEL Apply 2 g topically 4 (four) times daily.   Yes [provider]  diltiazem (TIAZAC) 300 MG 24 hr capsule Take 300 mg by mouth daily.   Yes [provider]  levothyroxine (SYNTHROID, LEVOTHROID) 50 MCG tablet Take 50 mcg by mouth daily before breakfast.    Yes [provider]  losartan (COZAAR) 100 MG tablet Take 100 mg by mouth daily.   Yes [provider]  pantoprazole (PROTONIX) 40 MG tablet Take 1 tablet (40 mg total) by mouth daily. 08/27/17  Yes Sheikh, Omair Latif, DO  polyethylene glycol (MIRALAX / GLYCOLAX) packet Take 17 g by mouth daily as needed for mild constipation.   Yes [provider]  traMADol (ULTRAM) 50 MG tablet Take 1 tablet (50 mg total) by mouth every 12 (twelve) hours as needed for moderate pain. 08/26/17  Yes Raiford Noble Denton, DO  Physical Exam:  Constitutional: NAD, calm, comfortable Vitals:   10/03/17 1224 10/03/17 1546 10/03/17 1635 10/03/17 1756  BP: (!) 196/77 (!) 164/104 (!) 170/76 (!) 188/69  Pulse: 91 99 80 82  Resp: _0 Temp: 98 F (36.7 C) 98.5 F (36.9 C)    TempSrc:  Oral    SpO2: 97% 98% 97% 100%  Weight:      Height:       Eyes: PERRL, lids and conjunctivae normal ENMT: Mucous membranes are  dry. Posterior pharynx clear of any exudate or lesions.  Neck: normal, supple, no masses, no thyromegaly Respiratory: clear to auscultation bilaterally, no wheezing, no crackles. Normal respiratory effort. No accessory muscle use.  Cardiovascular: Regular rate and rhythm, positive 2 out of 6 systolic murmurs / rubs / gallops.  +1 lower extremity edema. 2+ pedal pulses. No carotid bruits.  Abdomen: no tenderness or hepatosplenomegaly. Bowel sounds positive.  Colostomy in place with normal colored  stool. Musculoskeletal: no clubbing / cyanosis. No joint deformity upper and lower extremities.  Decreased range of motion of the left hip noted. No contractures. Normal muscle tone.  Skin: Pallor to skin.  No rashes, lesions, ulcers. No induration Neurologic: CN 2-12 grossly intact. Sensation intact, DTR normal. Strength 5/5 in all 4.  Psychiatric: Normal judgment and insight. Alert and oriented x 3. Normal mood.     Labs on Admission: I have personally reviewed following labs and imaging studies  CBC: Recent Labs  Lab 09/30/17 0012 10/03/17 1236  WBC 8.7 9.7  HGB 10.3* 10.4*  HCT 31.3* 32.2*  MCV 84.6 85.0  PLT 461* 163*   Basic Metabolic Panel: Recent Labs  Lab 09/30/17 0012 10/03/17 1236  NA 138 138  K 3.6 3.6  CL 104 102  CO2 23 26  GLUCOSE 128* 118*  BUN 10 8  CREATININE 0.65 0.73  CALCIUM 9.8 9.7   GFR: Estimated Creatinine Clearance: 51.5 mL/min (by C-G formula based on SCr of 0.73 mg/dL). Liver Function Tests: Recent Labs  Lab 09/30/17 0012 10/03/17 1236  AST 15 13*  ALT 8* 8*  ALKPHOS 82 79  BILITOT 0.5 0.5  PROT 8.4* 8.2*  ALBUMIN 3.8 3.6   Recent Labs  Lab 09/30/17 0012  LIPASE 24   No results for input(s): AMMONIA in the last 168 hours. Coagulation Profile: No results for input(s): INR, PROTIME in the last 168 hours. Cardiac Enzymes: No results for input(s): CKTOTAL, CKMB, CKMBINDEX, TROPONINI in the last 168 hours. BNP (last 3 results) No results for input(s): PROBNP in the last 8760 hours. HbA1C: No results for input(s): HGBA1C in the last 72 hours. CBG: No results for input(s): GLUCAP in the last 168 hours. Lipid Profile: No results for input(s): CHOL, HDL, LDLCALC, TRIG, CHOLHDL, LDLDIRECT in the last 72 hours. Thyroid Function Tests: No results for input(s): TSH, T4TOTAL, FREET4, T3FREE, THYROIDAB in the last 72 hours. Anemia Panel: No results for input(s): VITAMINB12, FOLATE, FERRITIN, TIBC, IRON, RETICCTPCT in the last 72  hours. Urine analysis:    Component Value Date/Time   COLORURINE YELLOW 09/30/2017 0015   APPEARANCEUR CLEAR 09/30/2017 0015   LABSPEC 1.010 09/30/2017 0015   LABSPEC 1.005 04/14/2016 1230   PHURINE 6.0 09/30/2017 0015   GLUCOSEU NEGATIVE 09/30/2017 0015   GLUCOSEU Negative 04/14/2016 1230   HGBUR LARGE (A) 09/30/2017 0015   BILIRUBINUR NEGATIVE 09/30/2017 0015   BILIRUBINUR Negative 04/14/2016 1230   KETONESUR NEGATIVE 09/30/2017 0015   PROTEINUR NEGATIVE 09/30/2017 0015   UROBILINOGEN  0.2 04/14/2016 1230   NITRITE NEGATIVE 09/30/2017 0015   LEUKOCYTESUR TRACE (A) 09/30/2017 0015   LEUKOCYTESUR Trace 04/14/2016 1230   Sepsis Labs: No results found for this or any previous visit (from the past 240 hour(s)).   Radiological Exams on Admission: No results found.   EKG: Personally reviewed interpretation: Sinus rhythm at 80 bpm with left atrial abnormality and voltage criteria met for signs of LVH.  Assessment/Plan Rectal bleeding, pelvic mass: Patient presents with complaints of weakness and continued rectal bleeding.  CT scan from 3/21, sigmoid colon of the pelvis with signs of possible perforation and abscess Hemoglobin appears to be stable.  Patient found to have left lower lobe status post diverting loop colonoscopy on 3/24.  Recent scope was unable to obtain tissue biopsy.  Radiology was called and recommended repeat CT scan. - Admit to a telemetry bed  - N.p.o. after midnight - Follow-up CT scan - Consult to interventional radiology in a.m. for need of tissue biopsy - Follow-up with general surgery in a.m.  Generalized weakness - IV fluids overnight  Normocytic normochromic anemia: Patient's hemoglobin on admission noted to be 10.4 which appears close to last check on 5/3.  Patient was typed and screened for possible need of blood products. - Recheck hemoglobin in a.m. - Transfuse blood as needed  Left hip pain: Patient reports having excruciating - Continue tramadol  prn pain  Essential hypertension: Initially uncontrolled blood pressures elevated up to 196/77.  Patient was given home blood pressure medications. - Continue losartan - Hydralazine IV prn sBP>180 or dBP> 110  Paroxysmal atrial fibrillation: CHA2DS2-VASc score = 4.  Patient was not put on anticoagulation due to recent ostomy.  Now patient has rectal bleeding as an additional cause for not being placed on anticoagulant.    - Continue diltiazem   CAD s/p CABG   H/O endometrial cancer  Hypothyroidism - Add on TSH - Continue levothyroxine  DVT prophylaxis: SCD   Code Status: Full Family Communication: Discussed plan of care with the patient and her daughter present at bedside Disposition Plan: Likely discharge home once medically stable Consults called: None  Admission status: Observation  Norval Morton MD [facility] Pager [phone]   If 7PM-7AM, please contact night-coverage [email or web address] Password [email or web address]  10/03/2017, 7:19 PM

## 2017-10-04 ENCOUNTER — Encounter (HOSPITAL_COMMUNITY): Payer: Self-pay | Admitting: Student

## 2017-10-04 DIAGNOSIS — R1900 Intra-abdominal and pelvic swelling, mass and lump, unspecified site: Secondary | ICD-10-CM | POA: Diagnosis not present

## 2017-10-04 DIAGNOSIS — K625 Hemorrhage of anus and rectum: Secondary | ICD-10-CM | POA: Diagnosis not present

## 2017-10-04 LAB — BASIC METABOLIC PANEL
Anion gap: 9 (ref 5–15)
BUN: 6 mg/dL (ref 6–20)
CO2: 25 mmol/L (ref 22–32)
Calcium: 9 mg/dL (ref 8.9–10.3)
Chloride: 106 mmol/L (ref 101–111)
Creatinine, Ser: 0.61 mg/dL (ref 0.44–1.00)
GFR calc Af Amer: >60 mL/min (ref >60)
GFR calc non Af Amer: >60 mL/min (ref >60)
Glucose, Bld: 94 mg/dL (ref 65–99)
Potassium: 3.2 mmol/L — ABNORMAL LOW (ref 3.5–5.1)
Sodium: 140 mmol/L (ref 135–145)

## 2017-10-04 LAB — CBC
HCT: 28.4 % — ABNORMAL LOW (ref 36.0–46.0)
Hemoglobin: 9.1 g/dL — ABNORMAL LOW (ref 12.0–15.0)
MCH: 27.4 pg (ref 26.0–34.0)
MCHC: 32 g/dL (ref 30.0–36.0)
MCV: 85.5 fL (ref 78.0–100.0)
Platelets: 421 10*3/uL — ABNORMAL HIGH (ref 150–400)
RBC: 3.32 MIL/uL — ABNORMAL LOW (ref 3.87–5.11)
RDW: 16.2 % — ABNORMAL HIGH (ref 11.5–15.5)
WBC: 8.7 10*3/uL (ref 4.0–10.5)

## 2017-10-04 LAB — MAGNESIUM: Magnesium: 1.7 mg/dL (ref 1.7–2.4)

## 2017-10-04 LAB — PHOSPHORUS: Phosphorus: 3.3 mg/dL (ref 2.5–4.6)

## 2017-10-04 MED ORDER — MORPHINE SULFATE (PF) 4 MG/ML IV SOLN
1.0000 mg | INTRAVENOUS | Status: DC | PRN
  Administered 2017-10-04 – 2017-10-15 (×13): 1 mg via INTRAVENOUS
  Filled 2017-10-04 (×14): qty 1

## 2017-10-04 MED ORDER — BOOST PLUS PO LIQD
237.0000 mL | Freq: Two times a day (BID) | ORAL | Status: DC
  Administered 2017-10-04 – 2017-10-12 (×15): 237 mL via ORAL
  Filled 2017-10-04 (×19): qty 237

## 2017-10-04 NOTE — Progress Notes (Signed)
Initial Nutrition Assessment  DOCUMENTATION CODES:   Not applicable  INTERVENTION:   Boost Plus chocolate BID- Each supplement provides 360kcal and 14g protein.   NUTRITION DIAGNOSIS:   Inadequate oral intake related to poor appetite, nausea as evidenced by per patient/family report.  GOAL:   Patient will meet greater than or equal to 90% of their needs  MONITOR:   PO intake, Supplement acceptance, Weight trends, Labs  REASON FOR ASSESSMENT:   Malnutrition Screening Tool    ASSESSMENT:   Patient with PMH significant for CAD s/p CABG, endometrial cancer, and A. fib with RVR. Recently admitted 3/21-3/29 and was found to have a mass involving the sigmoid colon of the pelvis with signs of possible perforation and abscess. Underwent diverting loop colostomy, left retrograde pyelogram with left JJ stent placement on 3/24. Presents this admission with rectal bleeding and pelvic mass.    Spoke with pt at bedside. Reports having decreased appetite since being discharged to rehab 3/29. States she does not like the food options they have, causing her to eat <50% of her meals. She also mentions that she consumed "bad ketchup" one month ago,which induced ongoing nausea. Nausea has resolved with medication. Pt had recent advancement in her diet. She is to order her first meal this afternoon. Discussed the importance of protein intake for preservation of lean body mass. Pt amendable to Boost Plus.   Pt endorses a UBW of 150 lb with a recent 8 lb wt loss. Records indicate pt has lost weight from her last admission but all weights look to be stated. Will attempt to obtain new actual weight to quantify actual wt loss. Nutrition-Focused physical exam completed.   Medications reviewed and include: NS @ 75 ml/hr Labs reviewed: K 3.2 (L)   NUTRITION - FOCUSED PHYSICAL EXAM:    Most Recent Value  Orbital Region  No depletion  Upper Arm Region  No depletion  Thoracic and Lumbar Region  Unable to  assess  Buccal Region  No depletion  Temple Region  Moderate depletion  Clavicle Bone Region  Mild depletion  Clavicle and Acromion Bone Region  Mild depletion  Scapular Bone Region  Unable to assess  Dorsal Hand  No depletion  Patellar Region  No depletion  Anterior Thigh Region  No depletion  Posterior Calf Region  No depletion  Edema (RD Assessment)  Moderate  Hair  Reviewed  Eyes  Reviewed  Mouth  Reviewed  Skin  Reviewed  Nails  Reviewed     Diet Order:   Diet Order           Diet NPO time specified Except for: Sips with Meds  Diet effective midnight        Diet Heart Room service appropriate? Yes; Fluid consistency: Thin  Diet effective now          EDUCATION NEEDS:   Education needs have been addressed  Skin:  Skin Assessment: Reviewed RN Assessment  Last BM:  10/04/17  Height:   Ht Readings from Last 1 Encounters:  10/03/17 5\' 1"  (1.549 m)    Weight:   Wt Readings from Last 1 Encounters:  10/03/17 138 lb (62.6 kg)    Ideal Body Weight:  47.7 kg  BMI:  Body mass index is 26.07 kg/m.  Estimated Nutritional Needs:   Kcal:  1300-1500 kcal  Protein:  65-75 g  Fluid:  >1.3 L/day    Mariana Single RD, LDN Clinical Nutrition Pager # [phone]

## 2017-10-04 NOTE — Consult Note (Signed)
Chief Complaint: Patient was seen in consultation today for pelvic mass  Referring Physician(s): Dr. Jani Gravel  Supervising Physician: Corrie Mckusick  Patient Status: [facility] - In-pt  History of Present Illness: Shannon Scott is a 77 y.o. female with past medical history of CAD s/p CABG, endometrial cancer, a fib who presents with complaint of rectal bleeding.  She was recently found to have a ill-defined heterogeneous mass involving the sigmoid colon of the pelvis in March 2019 and was hospitalized with extensive work-up  She underwent JJ stenting and diverting loop colostomy and was able to d/c home. She underwent colonoscopy with Dr. Collene Mares three days ago however, biopsies were not able to be obtained. Due to pain and weakness, the patient presented to the ED.  She has now been admitted for further work-up.  IR consulted for pelvic mass biopsy at the request of Dr. Maudie Mercury.   Case reviewed by Dr. Earleen Newport who approves patient for procedure.   Past Medical History:  Diagnosis Date  . Arthritis   . Asthma    allergy induced  . Bilateral carotid artery disease (HCC)    L carotid bruit  . Bursitis    left hip  . CAD (coronary artery disease)   . Cancer ([place])   . Dyslipidemia    intolerant to statins, welchol, niacin, zetia  . History of blood transfusion   . History of nuclear stress test 04/24/2012   lexiscan; normal study  . Hypertension   . Hypothyroidism   . Postoperative nausea and vomiting 01/02/2016    Past Surgical History:  Procedure Laterality Date  . ABDOMINAL HYSTERECTOMY    . Carotid Doppler  02/2012   40-59% right int carotid artery stenosis; 60-79% L int carotid stenosis; L carotid bruit  . CORONARY ARTERY BYPASS GRAFT  03/12/2004   LIMA to LAD, SVG to circumflex, SVG to PDA  . CYSTOSCOPY WITH STENT PLACEMENT Bilateral 08/21/2017   Procedure: CYSTOSCOPY WITH STENT PLACEMENT;  Surgeon: Ceasar Mons, MD;  Location: [facility];  Service: Urology;   Laterality: Bilateral;  . IR GENERIC HISTORICAL  04/29/2016   IR RADIOLOGIST EVAL & MGMT 04/29/2016 Sandi Mariscal, MD [facility] INTERV RAD  . IR GENERIC HISTORICAL  05/12/2016   IR RADIOLOGIST EVAL & MGMT 05/12/2016 Sandi Mariscal, MD [facility] INTERV RAD  . ROBOTIC ASSISTED TOTAL HYSTERECTOMY WITH BILATERAL SALPINGO OOPHERECTOMY Bilateral 12/30/2015   Procedure: XI ROBOTIC ASSISTED TOTAL HYSTERECTOMY WITH BILATERAL SALPINGO OOPHORECTOMY WITH SENTAL LYMPH NODE BIOPSY;  Surgeon: Nancy Marus, MD;  Location: [facility];  Service: Gynecology;  Laterality: Bilateral;  . TONSILLECTOMY    . TRANSTHORACIC ECHOCARDIOGRAM  04/2007   EF>55%; mild MR; mild-mod TR; mild pulm HTN; mild calcification of aortiv valve leaflets with mild valvular aortic stenosis  . TUBAL LIGATION      Allergies: Statins and Ciprofloxacin  Medications: Prior to Admission medications   Medication Sig Start Date End Date Taking? Authorizing Provider  acetaminophen (TYLENOL) 500 MG tablet Take 500 mg by mouth every 6 (six) hours as needed for mild pain, moderate pain, fever or headache.   Yes [provider]  ALPRAZolam Duanne Moron) 0.5 MG tablet Take 0.5 mg by mouth 2 (two) times daily. 09/30/17  Yes [provider]  diclofenac sodium (VOLTAREN) 1 % GEL Apply 2 g topically 4 (four) times daily.   Yes [provider]  diltiazem (TIAZAC) 300 MG 24 hr capsule Take 300 mg by mouth daily.   Yes [provider]  levothyroxine (SYNTHROID, [place]) 50  MCG tablet Take 50 mcg by mouth daily before breakfast.    Yes [provider]  losartan (COZAAR) 100 MG tablet Take 100 mg by mouth daily.   Yes [provider]  pantoprazole (PROTONIX) 40 MG tablet Take 1 tablet (40 mg total) by mouth daily. 08/27/17  Yes Sheikh, Omair Latif, DO  polyethylene glycol (MIRALAX / GLYCOLAX) packet Take 17 g by mouth daily as needed for mild constipation.   Yes [provider]  traMADol (ULTRAM) 50 MG tablet Take 1  tablet (50 mg total) by mouth every 12 (twelve) hours as needed for moderate pain. 08/26/17  Yes Kerney Elbe, DO     Family History  Problem Relation Age of Onset  . Hypertension Mother   . Heart disease Mother        Died in her 12s  . Stroke Father   . Kidney disease Brother   . Heart disease Brother        also HTN, hyperlipidemia  . Heart attack Brother   . Stroke Sister        x2  . Hypertension Sister     Social History   Socioeconomic History  . Marital status: Widowed    Spouse name: Not on file  . Number of children: 2  . Years of education: Not on file  . Highest education level: Not on file  Occupational History    Employer: RETIRED  Social Needs  . Financial resource strain: Not on file  . Food insecurity:    Worry: Not on file    Inability: Not on file  . Transportation needs:    Medical: Not on file    Non-medical: Not on file  Tobacco Use  . Smoking status: Never Smoker  . Smokeless tobacco: Never Used  Substance and Sexual Activity  . Alcohol use: No  . Drug use: No  . Sexual activity: Not on file  Lifestyle  . Physical activity:    Days per week: Not on file    Minutes per session: Not on file  . Stress: Not on file  Relationships  . Social connections:    Talks on phone: Not on file    Gets together: Not on file    Attends religious service: Not on file    Active member of club or organization: Not on file    Attends meetings of clubs or organizations: Not on file    Relationship status: Not on file  Other Topics Concern  . Not on file  Social History Narrative  . Not on file     Review of Systems: A 12 point ROS discussed and pertinent positives are indicated in the HPI above.  All other systems are negative.  Review of Systems  Constitutional: Negative for fatigue and fever.  Respiratory: Negative for cough and shortness of breath.   Cardiovascular: Negative for chest pain.  Gastrointestinal: Negative for abdominal pain.    Musculoskeletal: Positive for back pain (hip pain).  Psychiatric/Behavioral: Negative for behavioral problems and confusion.    Vital Signs: BP (!) 142/49 (BP Location: Right Arm)   Pulse 64   Temp 98.1 F (36.7 C) (Oral)   Resp 18   Ht 5' 1"  (1.549 m)   Wt 138 lb (62.6 kg)   SpO2 94%   BMI 26.07 kg/m   Physical Exam  Constitutional: She is oriented to person, place, and time. She appears well-developed.  Cardiovascular: Normal rate and regular rhythm.  Murmur heard. Pulmonary/Chest: Effort  normal and breath sounds normal. No respiratory distress.  Abdominal: Soft. She exhibits no distension. There is no tenderness.  Neurological: She is alert and oriented to person, place, and time.  Skin: Skin is warm and dry.  Psychiatric: She has a normal mood and affect. Her behavior is normal. Judgment and thought content normal.  Nursing note and vitals reviewed.        Imaging: Ct Abdomen Pelvis W Contrast  Result Date: 10/03/2017 CLINICAL DATA:  Rectal bleeding.  Malaise. HPIPt had surgery 1 month ago associated with a pelvic /colonic mass. Pt had a diverting colostomy. She did not have the mass removed. Pt states since then she continues to have pain in the pelvic region . She also is having blood from her rectum ever since the surgery but it seems to be getting worse and the bleeding is more severe. She has been seeing her surgeons and She had a flexible sigmoidoscopy on Friday but they did not determine the cause. Pt is concerned because it is taking long to figure out what is going on. They still dont have a tissue diagnosis. Pt called the surgeon today and was told to come to the ED. Pt feels like she is getting worse. PT did not take any of her medications today. EXAM: CT ABDOMEN AND PELVIS WITH CONTRAST TECHNIQUE: Multidetector CT imaging of the abdomen and pelvis was performed using the standard protocol following bolus administration of intravenous contrast. CONTRAST:  167m  ISOVUE-300 IOPAMIDOL (ISOVUE-300) INJECTION 61% COMPARISON:  08/18/2017 FINDINGS: Lower chest: 4 mm pleural-based nodular opacity in the lateral left lower lobe, image 3, series 4, above the field of view from the most recent prior study. Present and similar in size on a CT dated 01/14/2016 consistent with a benign nodule. No acute findings. Heart normal in size. Hepatobiliary: No liver masses. Focal fat noted adjacent to the falciform ligament. No other lesions. Liver normal in size. A few small dependent densities are noted in the gallbladder consistent with stones. No wall thickening or inflammation. No bile duct dilation. Pancreas: Unremarkable. No pancreatic ductal dilatation or surrounding inflammatory changes. Spleen: Normal in size without focal abnormality. Adrenals/Urinary Tract: No adrenal masses. There is mild-to-moderate left renal collecting system dilation. A stent extends from the left renal pelvis through the left ureter to the bladder, well positioned. No right hydronephrosis. Small, 11 mm, lower pole renal cyst. No other renal masses. No intrarenal stones. Right ureters normal in course and in caliber. Bladder is deviated by the large pelvic mass, but otherwise unremarkable. Stomach/Bowel: A large heterogeneous mass arises from the sigmoid colon and extends to the pelvic sidewall involving the ileo psoas muscle. Mass measures approximately 6.9 cm from superior to inferior by 7 cm x 5.5 cm transversely. This has increased in size from the prior exam. No other colonic masses. There are adjacent inflammatory changes and several prominent, subcentimeter lymph nodes. Multiple diverticula are noted along the colon, stable. A diverting loop colostomy has been performed in utilizing the mid transverse colon. Stomach shows a masslike area arising from the cardia extending to the gastroesophageal junction similar to the prior exam. This may be from previous hiatal hernia surgery or reflect a prominent fold.  It is felt unlikely reflect neoplastic disease since it is stable when compared to 01/14/2016 CT. Small bowel is unremarkable. Vascular/Lymphatic: There are few prominent to mildly enlarged pelvic and retroperitoneal lymph nodes. Largest measuring 1 cm lying to the left of the aortic bifurcation. This node is increased  in size from the prior CT where it measured 6 mm. Reproductive: Uterus is surgically absent. Other: Hazy inflammatory change is noted in the pelvis that has increased from the prior exam. No ascites. Musculoskeletal: No fracture or acute finding. No osteoblastic or osteolytic lesions. IMPRESSION: 1. No acute findings. 2. 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 It extends to involve the left pelvic sidewall, specifically the ileo psoas muscle. There are now inflammatory type changes adjacent to the mass that have increased from prior exam. There are multiple prominent to borderline enlarged lymph nodes several increased in size from the prior exam. No other evidence of metastatic disease. 3. Loop colostomy lies in transverse colon has been performed since the prior study. No evidence of bowel obstruction. 4. There is left hydronephrosis despite a well-positioned left ureteral stent. No right hydronephrosis. 5. Few small gallstones.  Aortic atherosclerosis. 6. Masslike prominence in the gastric cardia is most likely a prominent fold. This could be from previous hiatal hernia surgery if this has been performed in the past. The appearance of this portion of the stomach is stable from prior CTs dating back to August 2017. Electronically Signed   By: Lajean Manes M.D.   On: 10/03/2017 21:33    Labs:  CBC: Recent Labs    09/08/17 09/30/17 0012 10/03/17 1236 10/04/17 0616  WBC 8.5 8.7 9.7 8.7  HGB 10.8* 10.3* 10.4* 9.1*  HCT 32* 31.3* 32.2* 28.4*  PLT 473* 461* 472* 421*    COAGS: No results for input(s): INR, APTT in the last 8760 hours.  BMP: Recent Labs     08/26/17 0359 09/08/17 09/30/17 0012 10/03/17 1236 10/04/17 0616  NA 136 136* 138 138 140  K 3.8 4.1 3.6 3.6 3.2*  CL 99*  --  104 102 106  CO2 29  --  23 26 25   GLUCOSE 98  --  128* 118* 94  BUN <5* 8 10 8 6   CALCIUM 8.7*  --  9.8 9.7 9.0  CREATININE 0.49 0.6 0.65 0.73 0.61  GFRNONAA >60  --  >60 >60 >60  GFRAA >60  --  >60 >60 >60    LIVER FUNCTION TESTS: Recent Labs    08/25/17 1028 08/26/17 0359 09/30/17 0012 10/03/17 1236  BILITOT 0.9 0.5 0.5 0.5  AST 17 20 15  13*  ALT 9* 11* 8* 8*  ALKPHOS 64 64 82 79  PROT 5.9* 6.0* 8.4* 8.2*  ALBUMIN 2.5* 2.4* 3.8 3.6    TUMOR MARKERS: No results for input(s): AFPTM, CEA, CA199, CHROMGRNA in the last 8760 hours.  Assessment and Plan: Pelvic mass Patient with pelvic mass identified by CT scan in March 2019.  She undergone extensive work-up including diverting colostomy and colonoscopy.  Unfortunately, biopsies have not been successful to date.  She now presents with weakness, rectal bleeding, and worsening hip pain.  IR consulted for pelvic mass biopsy at the request of Dr. Maudie Mercury.  Case reviewed by Dr. Earleen Newport who approves patient for procedure. This cannot be accommodated in IR today, but a tentative plan has been made for tomorrow (5/8) as schedule allows.  Patient to be NPO after midnight.   Risks and benefits discussed with the patient including, but not limited to bleeding, infection, damage to adjacent structures or low yield requiring additional tests.  All of the patient's questions were answered, patient is agreeable to proceed. Consent signed and in chart.  Thank you for this interesting consult.  I greatly enjoyed meeting Shannon Scott  and look forward to participating in their care.  A copy of this report was sent to the requesting provider on this date.  Electronically Signed: Docia Barrier, PA 10/04/2017, 10:37 AM   I spent a total of 40 Minutes    in face to face in clinical consultation, greater  than 50% of which was counseling/coordinating care for pelvic mass.

## 2017-10-04 NOTE — Progress Notes (Signed)
Patient ID: Shannon Scott, female   DOB: 1941-02-13, 77 y.o.   MRN: [id number]                                                                PROGRESS NOTE                                                                                                                                                                                                             Patient Demographics:    Shannon Scott, is a 77 y.o. female, DOB - 05/18/1941, NLG:[id number]  Admit date - 10/03/2017   Admitting Physician Norval Morton, MD  Outpatient Primary MD for the patient is Leeroy Cha, MD  LOS - 0  Outpatient Specialists:     No chief complaint on file.      Brief Narrative   77 y.o. female with medical history significant of CAD s/p CABG, endometrial cancer, A. fib with RVR, and  hypothyroidism; who presents with complaints of generalized weakness and continued rectal bleeding.   She just been recently hospitalized 3/21-3/29, after coming in with complaints of abdominal pain with nausea and vomiting.  Patient was found to have a ill-defined heterogeneous mass involving the sigmoid colon of the pelvis with signs of possible perforation and abscess during the hospitalization patient was evaluated by general surgery,  cardiology, urology, and orthopedic surgery.  She underwent JJ stenting and laparoscopic diverting loop colostomy by Dr. Excell Seltzer on 3/24.  She was initially discharged to a rehab facility, but has been home for a while.  Since being hospitalized patient reports still having rectal bleeding.  She will get the urge to have a bowel movement and when she goes notes clots of dark blood present.  Patient going once or twice per day.  She notes that she is just had generalized weakness and felt as though she may pass out.  Denies having any loss of consciousness or recent falls.  Her biggest complaint is continued left-sided hip pain that runs down her leg and into her groin area.   Just 3 days ago, she had a scope by Dr. Collene Mares of gastroenterology, but the mass was not able to be reached.  Patient notes her weakness symptoms were so severe today to the point which she was unable to  get around and she came in for further evaluation.  Denies having any significant chest pain, shortness of breath, loss of consciousness, fall, fever, chills, nausea, or vomiting.  ED Course: Upon admission into the emergency department the patient was found to be 164/104-196/77, and all other vitals within normal limits. Labs revealed WBC 9.7, hemoglobin 10.4, platelets 472 and other labs relatively within normal limits.  General surgery and gastroenterology were notified and recommended admission for observation overnight with likely need of repeat CT scan and interventional radiology.  [facility] called to admit.     Subjective:    Shannon Scott today has left hip pain which is not new.  Pt denies abd pain, diarrhea.  No brbpr this am.  Pt states colostomy functioning properly.  No blood is ostomy output.  Afebrile  No headache, No chest pain,  No Nausea, No new weakness tingling or numbness, No Cough - SOB.    Assessment  & Plan :    Principal Problem:   Rectal bleeding Active Problems:   Hypothyroid   Essential hypertension   Normocytic anemia   Left hip pain   Generalized weakness  Rectal bleeding, pelvic mass: Patient presents with complaints of weakness and continued rectal bleeding.  CT scan from 3/21, sigmoid colon of the pelvis with signs of possible perforation and abscess Hemoglobin appears to be stable.  Patient found to have left lower lobe status post diverting loop colonoscopy on 3/24.  Recent scope was unable to obtain tissue biopsy.(Dr. Collene Mares)   Radiology was called and recommended repeat CT scan 5/6.=> IMPRESSION: 1. No acute findings. 2. 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 It extends to involve the left pelvic sidewall, specifically the ileo psoas  muscle. There are now inflammatory type changes adjacent to the mass that have increased from prior exam. There are multiple prominent to borderline enlarged lymph nodes several increased in size from the prior exam. No other evidence of metastatic disease. 3. Loop colostomy lies in transverse colon has been performed since the prior study. No evidence of bowel obstruction. 4. There is left hydronephrosis despite a well-positioned left ureteral stent. No right hydronephrosis. 5. Few small gallstones.  Aortic atherosclerosis. 6. Masslike prominence in the gastric cardia is most likely a prominent fold. This could be from previous hiatal hernia surgery if this has been performed in the past. The appearance of this portion of the stomach is stable from prior CTs dating back to August 2017.  - N.p.o. after midnight - Consulted interventional radiology in a.m. for need of tissue biopsy -appears surgery has been consulted, appreciate input  Generalized weakness Gentle hydration  Normocytic normochromic anemia: Patient's hemoglobin on admission noted to be 10.4 which appears close to last check on 5/3.  Patient was typed and screened for possible need of blood products. Check cbc in am   Left hip pain: Patient reports having excruciating - Continue tramadol prn pain  Essential hypertension: Initially uncontrolled blood pressures elevated up to 196/77.  Patient was given home blood pressure medications. - Continue losartan - Hydralazine IV prn sBP>180 or dBP> 110   Paroxysmal atrial fibrillation: CHA2DS2-VASc score = 4.  Patient was not put on anticoagulation due to recent ostomy.  Now patient has rectal bleeding as an additional cause for not being placed on anticoagulant.    - Continue diltiazem   CAD s/p CABG   H/O endometrial cancer  Hypothyroidism - TSH pending - Continue levothyroxine  DVT prophylaxis: SCD   Code Status:  Full Family Communication: Discussed plan of  care with the patient  Disposition Plan: Likely discharge home once medically stable Consults called: None  Admission status: Observation       Lab Results  Component Value Date   PLT 421 (H) 10/04/2017    Antibiotics  :  none  Anti-infectives (From admission, onward)   None        Objective:   Vitals:   10/03/17 2200 10/03/17 2230 10/03/17 2316 10/04/17 0535  BP: (!) 160/66 (!) 162/58 (!) 169/58 (!) 153/53  Pulse: 75 75 72 65  Resp: 19 15  16   Temp:   98.1 F (36.7 C) 98.2 F (36.8 C)  TempSrc:   Oral Oral  SpO2: 97% 98% 98% 95%  Weight:      Height:        Wt Readings from Last 3 Encounters:  10/03/17 62.6 kg (138 lb)  08/29/17 69.4 kg (153 lb)  08/22/17 69.4 kg (153 lb)     Intake/Output Summary (Last 24 hours) at 10/04/2017 0723 Last data filed at 10/04/2017 0600 Gross per 24 hour  Intake 1080 ml  Output -  Net 1080 ml     Physical Exam  Awake Alert, Oriented X 3, No new F.N deficits, Normal affect .AT,PERRAL Supple Neck,No JVD, No cervical lymphadenopathy appriciated.  Symmetrical Chest wall movement, Good air movement bilaterally, CTAB RRR,No Gallops,Rubs or new Murmurs, No Parasternal Heave +ve B.Sounds, Abd Soft, No tenderness, No organomegaly appriciated, No rebound - guarding or rigidity. No Cyanosis, Clubbing or edema, No new Rash or bruise   colostomy    Data Review:    CBC Recent Labs  Lab 09/30/17 0012 10/03/17 1236 10/04/17 0616  WBC 8.7 9.7 8.7  HGB 10.3* 10.4* 9.1*  HCT 31.3* 32.2* 28.4*  PLT 461* 472* 421*  MCV 84.6 85.0 85.5  MCH 27.8 27.4 27.4  MCHC 32.9 32.3 32.0  RDW 16.4* 16.1* 16.2*    Chemistries  Recent Labs  Lab 09/30/17 0012 10/03/17 1236  NA 138 138  K 3.6 3.6  CL 104 102  CO2 23 26  GLUCOSE 128* 118*  BUN 10 8  CREATININE 0.65 0.73  CALCIUM 9.8 9.7  AST 15 13*  ALT 8* 8*  ALKPHOS 82 79  BILITOT 0.5 0.5    ------------------------------------------------------------------------------------------------------------------ No results for input(s): CHOL, HDL, LDLCALC, TRIG, CHOLHDL, LDLDIRECT in the last 72 hours.  No results found for: HGBA1C ------------------------------------------------------------------------------------------------------------------ Recent Labs    10/03/17 1236  TSH 3.917   ------------------------------------------------------------------------------------------------------------------ No results for input(s): VITAMINB12, FOLATE, FERRITIN, TIBC, IRON, RETICCTPCT in the last 72 hours.  Coagulation profile No results for input(s): INR, PROTIME in the last 168 hours.  No results for input(s): DDIMER in the last 72 hours.  Cardiac Enzymes No results for input(s): CKMB, TROPONINI, MYOGLOBIN in the last 168 hours.  Invalid input(s): CK ------------------------------------------------------------------------------------------------------------------ No results found for: BNP  Inpatient Medications  Scheduled Meds: . diclofenac sodium  2 g Topical QID  . diltiazem  300 mg Oral Daily  . iopamidol      . levothyroxine  50 mcg Oral QAC breakfast  . losartan  100 mg Oral Daily  . pantoprazole  40 mg Oral Daily   Continuous Infusions: . sodium chloride 75 mL/hr at 10/03/17 2216   PRN Meds:.acetaminophen **OR** acetaminophen, ALPRAZolam, hydrALAZINE, HYDROcodone-acetaminophen, morphine injection, ondansetron **OR** ondansetron (ZOFRAN) IV, polyethylene glycol, traMADol  Micro Results No results found for this or any previous visit (from the past 240 hour(s)).  Radiology  Reports Ct Abdomen Pelvis W Contrast  Result Date: 10/03/2017 CLINICAL DATA:  Rectal bleeding.  Malaise. HPIPt had surgery 1 month ago associated with a pelvic /colonic mass. Pt had a diverting colostomy. She did not have the mass removed. Pt states since then she continues to have pain in the  pelvic region . She also is having blood from her rectum ever since the surgery but it seems to be getting worse and the bleeding is more severe. She has been seeing her surgeons and She had a flexible sigmoidoscopy on Friday but they did not determine the cause. Pt is concerned because it is taking long to figure out what is going on. They still dont have a tissue diagnosis. Pt called the surgeon today and was told to come to the ED. Pt feels like she is getting worse. PT did not take any of her medications today. EXAM: CT ABDOMEN AND PELVIS WITH CONTRAST TECHNIQUE: Multidetector CT imaging of the abdomen and pelvis was performed using the standard protocol following bolus administration of intravenous contrast. CONTRAST:  167m ISOVUE-300 IOPAMIDOL (ISOVUE-300) INJECTION 61% COMPARISON:  08/18/2017 FINDINGS: Lower chest: 4 mm pleural-based nodular opacity in the lateral left lower lobe, image 3, series 4, above the field of view from the most recent prior study. Present and similar in size on a CT dated 01/14/2016 consistent with a benign nodule. No acute findings. Heart normal in size. Hepatobiliary: No liver masses. Focal fat noted adjacent to the falciform ligament. No other lesions. Liver normal in size. A few small dependent densities are noted in the gallbladder consistent with stones. No wall thickening or inflammation. No bile duct dilation. Pancreas: Unremarkable. No pancreatic ductal dilatation or surrounding inflammatory changes. Spleen: Normal in size without focal abnormality. Adrenals/Urinary Tract: No adrenal masses. There is mild-to-moderate left renal collecting system dilation. A stent extends from the left renal pelvis through the left ureter to the bladder, well positioned. No right hydronephrosis. Small, 11 mm, lower pole renal cyst. No other renal masses. No intrarenal stones. Right ureters normal in course and in caliber. Bladder is deviated by the large pelvic mass, but otherwise  unremarkable. Stomach/Bowel: A large heterogeneous mass arises from the sigmoid colon and extends to the pelvic sidewall involving the ileo psoas muscle. Mass measures approximately 6.9 cm from superior to inferior by 7 cm x 5.5 cm transversely. This has increased in size from the prior exam. No other colonic masses. There are adjacent inflammatory changes and several prominent, subcentimeter lymph nodes. Multiple diverticula are noted along the colon, stable. A diverting loop colostomy has been performed in utilizing the mid transverse colon. Stomach shows a masslike area arising from the cardia extending to the gastroesophageal junction similar to the prior exam. This may be from previous hiatal hernia surgery or reflect a prominent fold. It is felt unlikely reflect neoplastic disease since it is stable when compared to 01/14/2016 CT. Small bowel is unremarkable. Vascular/Lymphatic: There are few prominent to mildly enlarged pelvic and retroperitoneal lymph nodes. Largest measuring 1 cm lying to the left of the aortic bifurcation. This node is increased in size from the prior CT where it measured 6 mm. Reproductive: Uterus is surgically absent. Other: Hazy inflammatory change is noted in the pelvis that has increased from the prior exam. No ascites. Musculoskeletal: No fracture or acute finding. No osteoblastic or osteolytic lesions. IMPRESSION: 1. No acute findings. 2. 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 It extends to involve the left pelvic sidewall, specifically  the ileo psoas muscle. There are now inflammatory type changes adjacent to the mass that have increased from prior exam. There are multiple prominent to borderline enlarged lymph nodes several increased in size from the prior exam. No other evidence of metastatic disease. 3. Loop colostomy lies in transverse colon has been performed since the prior study. No evidence of bowel obstruction. 4. There is left hydronephrosis despite  a well-positioned left ureteral stent. No right hydronephrosis. 5. Few small gallstones.  Aortic atherosclerosis. 6. Masslike prominence in the gastric cardia is most likely a prominent fold. This could be from previous hiatal hernia surgery if this has been performed in the past. The appearance of this portion of the stomach is stable from prior CTs dating back to August 2017. Electronically Signed   By: Lajean Manes M.D.   On: 10/03/2017 21:33    Time Spent in minutes  30   Jani Gravel M.D on 10/04/2017 at 7:23 AM  Between 7am to 7pm - Pager - [phone]    After 7pm go to [email or web address] - password [facility]  [facility] -  Office  [phone]

## 2017-10-04 NOTE — Care Management Obs Status (Signed)
[facility] NOTIFICATION   Patient Details  Name: Shannon Scott MRN: [id number] Date of Birth: 20-Sep-1940   Medicare Observation Status Notification Given:  Yes    Leeroy Cha, RN 10/04/2017, 10:21 AM

## 2017-10-05 ENCOUNTER — Observation Stay (HOSPITAL_COMMUNITY): Payer: Medicare Other

## 2017-10-05 ENCOUNTER — Encounter (HOSPITAL_COMMUNITY): Payer: Self-pay | Admitting: Radiology

## 2017-10-05 DIAGNOSIS — K625 Hemorrhage of anus and rectum: Secondary | ICD-10-CM | POA: Diagnosis not present

## 2017-10-05 LAB — COMPREHENSIVE METABOLIC PANEL
ALT: 8 U/L — ABNORMAL LOW (ref 14–54)
AST: 13 U/L — ABNORMAL LOW (ref 15–41)
Albumin: 3.2 g/dL — ABNORMAL LOW (ref 3.5–5.0)
Alkaline Phosphatase: 67 U/L (ref 38–126)
Anion gap: 10 (ref 5–15)
BUN: 5 mg/dL — ABNORMAL LOW (ref 6–20)
CO2: 24 mmol/L (ref 22–32)
Calcium: 9.1 mg/dL (ref 8.9–10.3)
Chloride: 104 mmol/L (ref 101–111)
Creatinine, Ser: 0.59 mg/dL (ref 0.44–1.00)
GFR calc Af Amer: >60 mL/min (ref >60)
GFR calc non Af Amer: >60 mL/min (ref >60)
Glucose, Bld: 105 mg/dL — ABNORMAL HIGH (ref 65–99)
Potassium: 3.3 mmol/L — ABNORMAL LOW (ref 3.5–5.1)
Sodium: 138 mmol/L (ref 135–145)
Total Bilirubin: 0.4 mg/dL (ref 0.3–1.2)
Total Protein: 7.3 g/dL (ref 6.5–8.1)

## 2017-10-05 LAB — CBC
HCT: 29.6 % — ABNORMAL LOW (ref 36.0–46.0)
Hemoglobin: 9.9 g/dL — ABNORMAL LOW (ref 12.0–15.0)
MCH: 28.5 pg (ref 26.0–34.0)
MCHC: 33.4 g/dL (ref 30.0–36.0)
MCV: 85.3 fL (ref 78.0–100.0)
Platelets: 442 10*3/uL — ABNORMAL HIGH (ref 150–400)
RBC: 3.47 MIL/uL — ABNORMAL LOW (ref 3.87–5.11)
RDW: 16.2 % — ABNORMAL HIGH (ref 11.5–15.5)
WBC: 10.3 10*3/uL (ref 4.0–10.5)

## 2017-10-05 LAB — PROTIME-INR
INR: 1.06
Prothrombin Time: 13.7 seconds (ref 11.4–15.2)

## 2017-10-05 MED ORDER — MIDAZOLAM HCL 2 MG/2ML IJ SOLN
INTRAMUSCULAR | Status: AC
  Filled 2017-10-05: qty 4

## 2017-10-05 MED ORDER — MIDAZOLAM HCL 2 MG/2ML IJ SOLN
INTRAMUSCULAR | Status: AC | PRN
  Administered 2017-10-05: 0.5 mg via INTRAVENOUS
  Administered 2017-10-05: 1 mg via INTRAVENOUS
  Administered 2017-10-05: 0.5 mg via INTRAVENOUS

## 2017-10-05 MED ORDER — LIDOCAINE HCL 1 % IJ SOLN
INTRAMUSCULAR | Status: AC | PRN
  Administered 2017-10-05: 10 mL via INTRADERMAL

## 2017-10-05 MED ORDER — FENTANYL CITRATE (PF) 100 MCG/2ML IJ SOLN
INTRAMUSCULAR | Status: AC
  Filled 2017-10-05: qty 4

## 2017-10-05 MED ORDER — POTASSIUM CHLORIDE CRYS ER 10 MEQ PO TBCR
40.0000 meq | EXTENDED_RELEASE_TABLET | Freq: Once | ORAL | Status: AC
  Administered 2017-10-05: 40 meq via ORAL
  Filled 2017-10-05: qty 4

## 2017-10-05 MED ORDER — FENTANYL CITRATE (PF) 100 MCG/2ML IJ SOLN
INTRAMUSCULAR | Status: AC | PRN
  Administered 2017-10-05: 50 ug via INTRAVENOUS
  Administered 2017-10-05 (×2): 25 ug via INTRAVENOUS

## 2017-10-05 NOTE — Progress Notes (Signed)
Patient ID: Shannon Scott, female   DOB: April 27, 1941, 77 y.o.   MRN: [id number]                                                                PROGRESS NOTE                                                                                                                                                                                                             Patient Demographics:    Shannon Scott, is a 77 y.o. female, DOB - [birth year], LKG:[id number]  Admit date - 10/03/2017   Admitting Physician Norval Morton, MD  Outpatient Primary MD for the patient is Leeroy Cha, MD  LOS - 0  Outpatient Specialists:     No chief complaint on file.      Brief Narrative   77 y.o. female with medical history significant of CAD s/p CABG, endometrial cancer, A. fib with RVR, and  hypothyroidism; who presents with complaints of generalized weakness and continued rectal bleeding.   She just been recently hospitalized 3/21-3/29, after coming in with complaints of abdominal pain with nausea and vomiting.  Patient was found to have a ill-defined heterogeneous mass involving the sigmoid colon of the pelvis with signs of possible perforation and abscess during the hospitalization patient was evaluated by general surgery,  cardiology, urology, and orthopedic surgery.  She underwent JJ stenting and laparoscopic diverting loop colostomy by Dr. Excell Seltzer on 3/24.  She was initially discharged to a rehab facility, but has been home for a while.  Since being hospitalized patient reports still having rectal bleeding.  She will get the urge to have a bowel movement and when she goes notes clots of dark blood present.  Patient going once or twice per day.  She notes that she is just had generalized weakness and felt as though she may pass out.  Denies having any loss of consciousness or recent falls.  Her biggest complaint is continued left-sided hip pain that runs down her leg and into her groin area.   Just 3 days ago, she had a scope by Dr. Collene Mares of gastroenterology, but the mass was not able to be reached.  Patient notes her weakness symptoms were so severe today to the point which she was unable to  get around and she came in for further evaluation.  Denies having any significant chest pain, shortness of breath, loss of consciousness, fall, fever, chills, nausea, or vomiting.  ED Course: Upon admission into the emergency department the patient was found to be 164/104-196/77, and all other vitals within normal limits. Labs revealed WBC 9.7, hemoglobin 10.4, platelets 472 and other labs relatively within normal limits.  General surgery and gastroenterology were notified and recommended admission for observation overnight with likely need of repeat CT scan and interventional radiology.  [facility] called to admit.     Subjective:    Shannon Scott  Reports no new complaints currently.    Assessment  & Plan :    Principal Problem:   Rectal bleeding Active Problems:   Hypothyroid   Essential hypertension   Normocytic anemia   Left hip pain   Generalized weakness  Rectal bleeding, pelvic mass: Patient presents with complaints of weakness and continued rectal bleeding.  CT scan from 3/21, sigmoid colon of the pelvis with signs of possible perforation and abscess Hemoglobin appears to be stable.  Patient found to have left lower lobe status post diverting loop colonoscopy on 3/24.  Recent scope was unable to obtain tissue biopsy.(Dr. Collene Mares)   Radiology was called and recommended repeat CT scan 5/6.=> IMPRESSION: 1. No acute findings. 2. 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 It extends to involve the left pelvic sidewall, specifically the ileo psoas muscle. There are now inflammatory type changes adjacent to the mass that have increased from prior exam. There are multiple prominent to borderline enlarged lymph nodes several increased in size from the prior exam. No other  evidence of metastatic disease. 3. Loop colostomy lies in transverse colon has been performed since the prior study. No evidence of bowel obstruction. 4. There is left hydronephrosis despite a well-positioned left ureteral stent. No right hydronephrosis. 5. Few small gallstones.  Aortic atherosclerosis. 6. Masslike prominence in the gastric cardia is most likely a prominent fold. This could be from previous hiatal hernia surgery if this has been performed in the past. The appearance of this portion of the stomach is stable from prior CTs dating back to August 2017.  - Consulted interventional radiology and patient is s/p bx - surgery consulted and no plans for further work up, they have recommended oncology consult which I will do next am.   Generalized weakness Gentle hydration  Hypokalemia - replace K and reassess next am.  Normocytic normochromic anemia: Patient's hemoglobin on admission noted to be 10.4 which appears close to last check on 5/3.  Patient was typed and screened for possible need of blood products. - Check cbc in am  Left hip pain:  - Continue tramadol prn pain  Essential hypertension: Initially uncontrolled blood pressures elevated up to 196/77.  Patient was given home blood pressure medications. - Continue losartan - Hydralazine IV prn SBP>180 or dBP> 110   Paroxysmal atrial fibrillation: CHA2DS2-VASc score = 4.  Patient was not put on anticoagulation due to recent ostomy.  Now patient has rectal bleeding as an additional cause for not being placed on anticoagulant.    - Continue diltiazem   CAD s/p CABG   H/O endometrial cancer  Hypothyroidism - TSH pending - Continue levothyroxine  DVT prophylaxis: SCD   Code Status: Full Family Communication: Discussed plan of care with the patient  Disposition Plan: Likely discharge home once medically stable Consults called: None  Admission status: Observation   Lab Results  Component  Value Date   PLT  442 (H) 10/05/2017    Antibiotics  :  none  Anti-infectives (From admission, onward)   None        Objective:   Vitals:   10/05/17 0830 10/05/17 0835 10/05/17 0839 10/05/17 1500  BP: (!) 146/53 139/64 (!) 136/54 (!) 135/47  Pulse: 88 84 86 75  Resp: 19 19 19 18   Temp:    98.4 F (36.9 C)  TempSrc:    Oral  SpO2: 97% 98% 96% 97%  Weight:      Height:        Wt Readings from Last 3 Encounters:  10/05/17 63.1 kg (139 lb 3.2 oz)  08/29/17 69.4 kg (153 lb)  08/22/17 69.4 kg (153 lb)     Intake/Output Summary (Last 24 hours) at 10/05/2017 1637 Last data filed at 10/05/2017 1300 Gross per 24 hour  Intake 2640 ml  Output -  Net 2640 ml     Physical Exam  Awake and alert, Oriented x 3, in nad. [place].AT,PERRAL Supple Neck,No JVD, No cervical lymphadenopathy appriciated.  Symmetrical Chest wall movement, Good air movement bilaterally, CTAB RRR,No Gallops,Rubs or new Murmurs, No Parasternal Heave +ve B.Sounds, Abd Soft, No tenderness, No organomegaly appriciated, No rebound - guarding or rigidity. No Cyanosis, Clubbing or edema, No new Rash or bruise   colostomy    Data Review:    CBC Recent Labs  Lab 09/30/17 0012 10/03/17 1236 10/04/17 0616 10/05/17 0625  WBC 8.7 9.7 8.7 10.3  HGB 10.3* 10.4* 9.1* 9.9*  HCT 31.3* 32.2* 28.4* 29.6*  PLT 461* 472* 421* 442*  MCV 84.6 85.0 85.5 85.3  MCH 27.8 27.4 27.4 28.5  MCHC 32.9 32.3 32.0 33.4  RDW 16.4* 16.1* 16.2* 16.2*    Chemistries  Recent Labs  Lab 09/30/17 0012 10/03/17 1236 10/04/17 0616 10/05/17 0625  NA 138 138 140 138  K 3.6 3.6 3.2* 3.3*  CL 104 102 106 104  CO2 23 26 25 24   GLUCOSE 128* 118* 94 105*  BUN 10 8 6  5*  CREATININE 0.65 0.73 0.61 0.59  CALCIUM 9.8 9.7 9.0 9.1  MG  --   --  1.7  --   AST 15 13*  --  13*  ALT 8* 8*  --  8*  ALKPHOS 82 79  --  67  BILITOT 0.5 0.5  --  0.4    ------------------------------------------------------------------------------------------------------------------ No results for input(s): CHOL, HDL, LDLCALC, TRIG, CHOLHDL, LDLDIRECT in the last 72 hours.  No results found for: HGBA1C ------------------------------------------------------------------------------------------------------------------ Recent Labs    10/03/17 1236  TSH 3.917   ------------------------------------------------------------------------------------------------------------------ No results for input(s): VITAMINB12, FOLATE, FERRITIN, TIBC, IRON, RETICCTPCT in the last 72 hours.  Coagulation profile Recent Labs  Lab 10/05/17 0625  INR 1.06    No results for input(s): DDIMER in the last 72 hours.  Cardiac Enzymes No results for input(s): CKMB, TROPONINI, MYOGLOBIN in the last 168 hours.  Invalid input(s): CK ------------------------------------------------------------------------------------------------------------------ No results found for: BNP  Inpatient Medications  Scheduled Meds: . diclofenac sodium  2 g Topical QID  . diltiazem  300 mg Oral Daily  . fentaNYL      . lactose free nutrition  237 mL Oral BID BM  . levothyroxine  50 mcg Oral QAC breakfast  . losartan  100 mg Oral Daily  . midazolam      . pantoprazole  40 mg Oral Daily   Continuous Infusions: . sodium chloride 75 mL/hr at 10/05/17 1000   PRN  Meds:.acetaminophen **OR** acetaminophen, ALPRAZolam, hydrALAZINE, HYDROcodone-acetaminophen, morphine injection, ondansetron **OR** ondansetron (ZOFRAN) IV, polyethylene glycol, traMADol  Micro Results No results found for this or any previous visit (from the past 240 hour(s)).  Radiology Reports Ct Abdomen Pelvis W Contrast  Result Date: 10/03/2017 CLINICAL DATA:  Rectal bleeding.  Malaise. HPIPt had surgery 1 month ago associated with a pelvic /colonic mass. Pt had a diverting colostomy. She did not have the mass removed. Pt states  since then she continues to have pain in the pelvic region . She also is having blood from her rectum ever since the surgery but it seems to be getting worse and the bleeding is more severe. She has been seeing her surgeons and She had a flexible sigmoidoscopy on Friday but they did not determine the cause. Pt is concerned because it is taking long to figure out what is going on. They still dont have a tissue diagnosis. Pt called the surgeon today and was told to come to the ED. Pt feels like she is getting worse. PT did not take any of her medications today. EXAM: CT ABDOMEN AND PELVIS WITH CONTRAST TECHNIQUE: Multidetector CT imaging of the abdomen and pelvis was performed using the standard protocol following bolus administration of intravenous contrast. CONTRAST:  142m ISOVUE-300 IOPAMIDOL (ISOVUE-300) INJECTION 61% COMPARISON:  08/18/2017 FINDINGS: Lower chest: 4 mm pleural-based nodular opacity in the lateral left lower lobe, image 3, series 4, above the field of view from the most recent prior study. Present and similar in size on a CT dated 01/14/2016 consistent with a benign nodule. No acute findings. Heart normal in size. Hepatobiliary: No liver masses. Focal fat noted adjacent to the falciform ligament. No other lesions. Liver normal in size. A few small dependent densities are noted in the gallbladder consistent with stones. No wall thickening or inflammation. No bile duct dilation. Pancreas: Unremarkable. No pancreatic ductal dilatation or surrounding inflammatory changes. Spleen: Normal in size without focal abnormality. Adrenals/Urinary Tract: No adrenal masses. There is mild-to-moderate left renal collecting system dilation. A stent extends from the left renal pelvis through the left ureter to the bladder, well positioned. No right hydronephrosis. Small, 11 mm, lower pole renal cyst. No other renal masses. No intrarenal stones. Right ureters normal in course and in caliber. Bladder is deviated by  the large pelvic mass, but otherwise unremarkable. Stomach/Bowel: A large heterogeneous mass arises from the sigmoid colon and extends to the pelvic sidewall involving the ileo psoas muscle. Mass measures approximately 6.9 cm from superior to inferior by 7 cm x 5.5 cm transversely. This has increased in size from the prior exam. No other colonic masses. There are adjacent inflammatory changes and several prominent, subcentimeter lymph nodes. Multiple diverticula are noted along the colon, stable. A diverting loop colostomy has been performed in utilizing the mid transverse colon. Stomach shows a masslike area arising from the cardia extending to the gastroesophageal junction similar to the prior exam. This may be from previous hiatal hernia surgery or reflect a prominent fold. It is felt unlikely reflect neoplastic disease since it is stable when compared to 01/14/2016 CT. Small bowel is unremarkable. Vascular/Lymphatic: There are few prominent to mildly enlarged pelvic and retroperitoneal lymph nodes. Largest measuring 1 cm lying to the left of the aortic bifurcation. This node is increased in size from the prior CT where it measured 6 mm. Reproductive: Uterus is surgically absent. Other: Hazy inflammatory change is noted in the pelvis that has increased from the prior exam. No ascites.  Musculoskeletal: No fracture or acute finding. No osteoblastic or osteolytic lesions. IMPRESSION: 1. No acute findings. 2. 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 It extends to involve the left pelvic sidewall, specifically the ileo psoas muscle. There are now inflammatory type changes adjacent to the mass that have increased from prior exam. There are multiple prominent to borderline enlarged lymph nodes several increased in size from the prior exam. No other evidence of metastatic disease. 3. Loop colostomy lies in transverse colon has been performed since the prior study. No evidence of bowel obstruction. 4.  There is left hydronephrosis despite a well-positioned left ureteral stent. No right hydronephrosis. 5. Few small gallstones.  Aortic atherosclerosis. 6. Masslike prominence in the gastric cardia is most likely a prominent fold. This could be from previous hiatal hernia surgery if this has been performed in the past. The appearance of this portion of the stomach is stable from prior CTs dating back to August 2017. Electronically Signed   By: Lajean Manes M.D.   On: 10/03/2017 21:33   Ct Biopsy  Result Date: 10/05/2017 INDICATION: 77 year old female with a history of locally invasive exophytic sigmoid colonic mass highly concerning for adenocarcinoma. She has not undergone prior biopsy and presents for CT-guided biopsy to obtain tissue diagnosis. EXAM: CT guided tissue biopsy of left pelvic mass MEDICATIONS: None. ANESTHESIA/SEDATION: Moderate (conscious) sedation was employed during this procedure. A total of Versed 2 mg and Fentanyl 100 mcg was administered intravenously. Moderate Sedation Time: 12 minutes. The patient's level of consciousness and vital signs were monitored continuously by radiology nursing throughout the procedure under my direct supervision. FLUOROSCOPY TIME:  Fluoroscopy Time: 0 minutes 0 seconds (0 mGy). COMPLICATIONS: None immediate. PROCEDURE: Informed written consent was obtained from the patient after a thorough discussion of the procedural risks, benefits and alternatives. All questions were addressed. A timeout was performed prior to the initiation of the procedure. A planning axial CT scan was performed. The exophytic mass was identified in the left lower quadrant. A suitable skin entry site was selected medial to the inferior epigastric vessels. The overlying skin was prepped and marked. Under intermittent CT guidance, a 17 gauge introducer needle was advanced into the margin of the mass. Multiple 18 gauge core biopsies were then coaxially obtained and placed in formalin before  being delivered to pathology for further analysis. The introducer needle was removed. Post biopsy axial CT imaging demonstrates no evidence of immediate complication. Patient tolerated the procedure well. IMPRESSION: Technically successful CT-guided biopsy of left pelvic mass. Electronically Signed   By: Jacqulynn Cadet M.D.   On: 10/05/2017 09:21    Time Spent in minutes  30 min   Velvet Bathe M.D on 10/05/2017 at 4:37 PM  Between 7am to 7pm - Pager [phone]  After 7pm go to [email or web address] - password [email or web address]  [facility] -  Office  [phone]

## 2017-10-05 NOTE — Progress Notes (Addendum)
CC: Weakness and rectal bleeding  Subjective: Patient known to our service who presented with 2 days of nausea and vomiting left-sided abdominal pain on 08/18/2017.  She was actually in the process of undergoing a bowel prep for colonoscopy at that time and she developed severe acute abdominal pain and was sent to the ED for evaluation.  CT showed left hydronephrosis and left hydroureter right renal cyst.  The left ureter was dilated and becomes narrowed there was an inflammatory pelvic mass. A transition point between dilated large bowel and decompressed large bowel occurs in the sigmoid colon. There is a heterogeneous ill-defined mass involving the sigmoid colon and left side of the pelvis which includes the colon wall, adjacent fat, and both fluid and gas elements. Findings are suspicious for a focal perforation with abscess formation. Malignancy was a strong consideration given the appearance and colon obstruction. The mass also involved the left ureter causing an element of left ureteral dilatation worrisome for an element of left ureteral obstruction. The gas and fluid collection was very small measuring 1.8 x 1.2 cm.     Patient was resuscitated.  Options were discussed with the patient and family in detail.  Since the mass was invading the left pelvic wall it was determined that she should have a diverting ostomy for relief of the obstruction.  She underwent a diverting colostomy and left JJ stent placement on 08/21/2017.  She was subsequently discharged to a skilled nursing facility at that time.  She is undergone flexible sigmoidoscopy by Dr. Collene Mares with no tissue biopsy obtained.   She is readmitted on 10/03/2017 with weakness and rectal bleeding.  She has been seen by IR and has undergone IR biopsy.  CT scan done with contrast on 10/03/2017, shows an exophytic sigmoid colon carcinoma increased in size.  It extends to and involves the left pelvic sidewall specifically into the iliopsoas muscle.   There are no inflammatory changes adjacent to the mass that is increased from prior exam but there are multiple prominent borderline enlarged lymph nodes increased in size since the last exam.  No other evidence of metastatic disease.  The loop colostomy lies in the transverse colon was with no evidence of bowel obstruction.  We are asked to see.  She has not been back to the office since her surgery 08/21/2017.    Pt feeling better this AM, but has had bleeding from the rectum since her last admission.  She notes she can't eat much, no appetite, and her left hip bursitis is very painful and limits her significantly.    objective: Vital signs in last 24 hours: Temp:  [98.6 F (37 C)-99.5 F (37.5 C)] 99.1 F (37.3 C) (05/08 0448) Pulse Rate:  [80-99] 86 (05/08 0839) Resp:  [13-19] 19 (05/08 0839) BP: (136-181)/(48-74) 136/54 (05/08 0839) SpO2:  [94 %-98 %] 96 % (05/08 0839) Weight:  [63.1 kg (139 lb 3.2 oz)] 63.1 kg (139 lb 3.2 oz) (05/08 0448) Last BM Date: 10/04/17 990 p.o. 1800 IV Voided x9 No stool recorded Afebrile vital signs are stable  Labs show potassium of 3.3, otherwise normal CMP. Hct 10.3, hemoglobin 9.9, hematocrit 29.6, platelets 442,000, INR 1.06 I do not see tumor markers  Intake/Output from previous day: 05/07 0701 - 05/08 0700 In: 2790 [P.O.:990; I.V.:1800] Out: 0  Intake/Output this shift: No intake/output data recorded.  General appearance: alert, cooperative and no distress Resp: clear to auscultation bilaterally GI: soft, non-tender; bowel sounds normal; no masses,  no organomegaly  and ostomy pouch has just been changed but she has no issues with this.  it is working well.  Everything appears to be well healed from the surgery.   Extremities: extremities normal, atraumatic, no cyanosis or edema  She does get some swelling in her feet at times.  Blames salt.  Lab Results:  Recent Labs    10/04/17 0616 10/05/17 0625  WBC 8.7 10.3  HGB 9.1* 9.9*  HCT  28.4* 29.6*  PLT 421* 442*    BMET Recent Labs    10/04/17 0616 10/05/17 0625  NA 140 138  K 3.2* 3.3*  CL 106 104  CO2 25 24  GLUCOSE 94 105*  BUN 6 5*  CREATININE 0.61 0.59  CALCIUM 9.0 9.1   PT/INR Recent Labs    10/05/17 0625  LABPROT 13.7  INR 1.06    Recent Labs  Lab 09/30/17 0012 10/03/17 1236 10/05/17 0625  AST 15 13* 13*  ALT 8* 8* 8*  ALKPHOS 82 79 67  BILITOT 0.5 0.5 0.4  PROT 8.4* 8.2* 7.3  ALBUMIN 3.8 3.6 3.2*     Lipase     Component Value Date/Time   LIPASE 24 09/30/2017 0012     Medications: . diclofenac sodium  2 g Topical QID  . diltiazem  300 mg Oral Daily  . fentaNYL      . lactose free nutrition  237 mL Oral BID BM  . levothyroxine  50 mcg Oral QAC breakfast  . losartan  100 mg Oral Daily  . midazolam      . pantoprazole  40 mg Oral Daily   . sodium chloride 75 mL/hr at 10/05/17 0207   Anti-infectives (From admission, onward)   None      Assessment/Plan Atrial fibrillation with RVR CAD Carotid disease Left hip bursitis (primary source of pain) Hypertension Hypothyroid  Anemia Hyperlipidemia   Obstructing rectosigmoid colonic mass - ongoing rectal bleeding CT-guided biopsy of exophytic left sigmoid colonic mass 10/05/2017, Dr. Jacqulynn Cadet S/P laparoscopic diverting transverse loop colostomy, 08/21/2017 Dr. Excell Seltzer Left ureteral obstruction secondary to intrinsic compression from colonic mass Left retrograde pyelogram with intraoperative interpretation, left JJ stent placement, 08/21/2017 Dr. Ellison Hughs S/p Flexible sigmoidoscopy, 09/30/2017 Dr. Juanita Craver -preparation poor, diverticulosis in the sigmoid, no specimen obtained  FEN: IV fluids/regular diet ID: No antibiotics DVT: SCDs   Plan:  From a surgical standpoint there is no further surgery planned.  The tumor did not appear resectable in March.  She has relief of her obstruction with the diverting colostomy. I have ordered tumor markers and  she has had a tissue biopsy by IR.  Hopefully this will allow Oncology to see and begin treatments, for this presumed tumor.  I have discussed this with Dr. Wendee Beavers.         LOS: 0 days    Marquis Diles 10/05/2017 [phone]

## 2017-10-05 NOTE — Sedation Documentation (Signed)
Patient is resting comfortably with eyes closed in NAD. No s/s of pain/discomfort with lidocaine admin

## 2017-10-05 NOTE — Procedures (Signed)
Interventional Radiology Procedure Note  Procedure: CT guided biopsy of exophytic left sigmoid colonic mass  Complications: None  Estimated Blood Loss: None  Recommendations: - Return to room - Resume diet - Path pending  Signed,  Criselda Peaches, MD

## 2017-10-06 ENCOUNTER — Ambulatory Visit: Payer: Medicare Other | Admitting: Internal Medicine

## 2017-10-06 DIAGNOSIS — K625 Hemorrhage of anus and rectum: Secondary | ICD-10-CM | POA: Diagnosis not present

## 2017-10-06 LAB — AFP TUMOR MARKER: AFP, Serum, Tumor Marker: 2.2 ng/mL (ref 0.0–8.3)

## 2017-10-06 LAB — BASIC METABOLIC PANEL
Anion gap: 7 (ref 5–15)
BUN: 7 mg/dL (ref 6–20)
CO2: 23 mmol/L (ref 22–32)
Calcium: 8.7 mg/dL — ABNORMAL LOW (ref 8.9–10.3)
Chloride: 105 mmol/L (ref 101–111)
Creatinine, Ser: 0.63 mg/dL (ref 0.44–1.00)
GFR calc Af Amer: >60 mL/min (ref >60)
GFR calc non Af Amer: >60 mL/min (ref >60)
Glucose, Bld: 132 mg/dL — ABNORMAL HIGH (ref 65–99)
Potassium: 4.1 mmol/L (ref 3.5–5.1)
Sodium: 135 mmol/L (ref 135–145)

## 2017-10-06 LAB — CEA: CEA: 33.1 ng/mL — ABNORMAL HIGH (ref 0.0–4.7)

## 2017-10-06 LAB — CANCER ANTIGEN 19-9: CA 19-9: 179 U/mL — ABNORMAL HIGH (ref 0–35)

## 2017-10-06 LAB — CA 125: Cancer Antigen (CA) 125: 142.5 U/mL — ABNORMAL HIGH (ref 0.0–38.1)

## 2017-10-06 MED ORDER — DRONABINOL 2.5 MG PO CAPS
2.5000 mg | ORAL_CAPSULE | Freq: Two times a day (BID) | ORAL | Status: DC
  Administered 2017-10-06 – 2017-10-16 (×17): 2.5 mg via ORAL
  Filled 2017-10-06 (×18): qty 1

## 2017-10-06 NOTE — Consult Note (Signed)
[place] Nurse ostomy follow up Stoma type/location:  Mid-upper abdomen.  Transverse colostomy Stomal assessment/size: Oval, approx 1.5 inches side to side and 1 inch top to bottom Peristomal assessment: Intact, normal color and texture Treatment options for stomal/peristomal skin: barrier ring Output: None present at the time of pouch change Ostomy pouching: 1pc., Lawson # [id number] Education provided: How to use the pattern I made for her, and how to use a barrier ring. This is an established patient capable of performing self-ostomy care that wanted additional assistance with creating a pattern for cutting the pouch opening.  This was accomplished. Thank you for the consult.  Discussed plan of care with the patient and bedside nurse.  [place] nurse will not follow at this time.  Please re-consult the [place] team if needed.  Val Riles, RN, MSN, CWOCN, CNS-BC, pager [phone]

## 2017-10-06 NOTE — Progress Notes (Signed)
Patient ID: Shannon Scott, female   DOB: 09/03/1940, 77 y.o.   MRN: [id number]                                                                PROGRESS NOTE                                                                                                                                                                                                             Patient Demographics:    Shannon Scott, is a 78 y.o. female, DOB - 1941/05/25, GYF:[id number]  Admit date - 10/03/2017   Admitting Physician Norval Morton, MD  Outpatient Primary MD for the patient is Leeroy Cha, MD  LOS - 0  Outpatient Specialists:     No chief complaint on file.      Brief Narrative   77 y.o. female with medical history significant of CAD s/p CABG, endometrial cancer, A. fib with RVR, and  hypothyroidism; who presents with complaints of generalized weakness and continued rectal bleeding.   She just been recently hospitalized 3/21-3/29, after coming in with complaints of abdominal pain with nausea and vomiting.  Patient was found to have a ill-defined heterogeneous mass involving the sigmoid colon of the pelvis with signs of possible perforation and abscess during the hospitalization patient was evaluated by general surgery,  cardiology, urology, and orthopedic surgery.  She underwent JJ stenting and laparoscopic diverting loop colostomy by Dr. Excell Seltzer on 3/24.  She was initially discharged to a rehab facility, but has been home for a while.  Since being hospitalized patient reports still having rectal bleeding.  She will get the urge to have a bowel movement and when she goes notes clots of dark blood present.  Patient going once or twice per day.  She notes that she is just had generalized weakness and felt as though she may pass out.  Denies having any loss of consciousness or recent falls.  Her biggest complaint is continued left-sided hip pain that runs down her leg and into her groin area.   Just 3 days ago, she had a scope by Dr. Collene Mares of gastroenterology, but the mass was not able to be reached.  Patient notes her weakness symptoms were so severe today to the point which she was unable to  get around and she came in for further evaluation.  Denies having any significant chest pain, shortness of breath, loss of consciousness, fall, fever, chills, nausea, or vomiting.  ED Course: Upon admission into the emergency department the patient was found to be 164/104-196/77, and all other vitals within normal limits. Labs revealed WBC 9.7, hemoglobin 10.4, platelets 472 and other labs relatively within normal limits.  General surgery and gastroenterology were notified and recommended admission for observation overnight with likely need of repeat CT scan and interventional radiology.  [facility] called to admit.   Subjective:    Shannon Scott   Pt reports no new complaints currently.     Assessment  & Plan :    Principal Problem:   Rectal bleeding Active Problems:   Hypothyroid   Essential hypertension   Normocytic anemia   Left hip pain   Generalized weakness  Rectal bleeding, pelvic mass: Patient presents with complaints of weakness and continued rectal bleeding.  CT scan from 3/21, sigmoid colon of the pelvis with signs of possible perforation and abscess Hemoglobin appears to be stable.  Patient found to have left lower lobe status post diverting loop colonoscopy on 3/24.  Recent scope was unable to obtain tissue biopsy.(Dr. Collene Mares)   Radiology was called and recommended repeat CT scan 5/6.=>  IMPRESSION: 1. No acute findings. 2. 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 It extends to involve the left pelvic sidewall, specifically the ileo psoas muscle. There are now inflammatory type changes adjacent to the mass that have increased from prior exam. There are multiple prominent to borderline enlarged lymph nodes several increased in size from the prior exam. No other  evidence of metastatic disease. 3. Loop colostomy lies in transverse colon has been performed since the prior study. No evidence of bowel obstruction. 4. There is left hydronephrosis despite a well-positioned left ureteral stent. No right hydronephrosis. 5. Few small gallstones.  Aortic atherosclerosis. 6. Masslike prominence in the gastric cardia is most likely a prominent fold. This could be from previous hiatal hernia surgery if this has been performed in the past. The appearance of this portion of the stomach is stable from prior CTs dating back to August 2017.  - Consulted interventional radiology and patient is s/p bx - surgery consulted and no plans for further work up, they have recommended oncology consult which I will do once pathology reports are in. Most likely next am.  Generalized weakness - improving with IVF's. Will continue for 1 more day. Pt reports poor oral intake  Poor oral intake - Will add marinol. Discussed possible side effects with patient and son and they are agreeable.   Hypokalemia - replace K and reassess next am.  Normocytic normochromic anemia: Patient's hemoglobin on admission noted to be 10.4 which appears close to last check on 5/3.  Patient was typed and screened for possible need of blood products. - Check cbc in am  Left hip pain:  - Continue tramadol prn pain  Essential hypertension: Initially uncontrolled blood pressures elevated up to 196/77.  Patient was given home blood pressure medications. - Continue losartan - Hydralazine IV prn SBP>180 or dBP> 110   Paroxysmal atrial fibrillation: CHA2DS2-VASc score = 4.  Patient was not put on anticoagulation due to recent ostomy.  Now patient has rectal bleeding as an additional cause for not being placed on anticoagulant.    - Continue diltiazem   CAD s/p CABG   H/O endometrial cancer  Hypothyroidism - TSH pending -  Continue levothyroxine  DVT prophylaxis: SCD   Code Status:  Full Family Communication: Discussed plan of care with the patient  Disposition Plan: Likely discharge home once medically stable Consults called: None  Admission status: Observation   Lab Results  Component Value Date   PLT 442 (H) 10/05/2017    Antibiotics  :  none  Anti-infectives (From admission, onward)   None        Objective:   Vitals:   10/05/17 2119 10/06/17 0444 10/06/17 0500 10/06/17 1431  BP: (!) 140/49 (!) 140/49  (!) 138/50  Pulse: 78 77  69  Resp: (!) 26 (!) 22  16  Temp: 99.2 F (37.3 C)  99 F (37.2 C) 98.7 F (37.1 C)  TempSrc: Oral  Oral   SpO2: 96% 93%  95%  Weight:      Height:        Wt Readings from Last 3 Encounters:  10/05/17 63.1 kg (139 lb 3.2 oz)  08/29/17 69.4 kg (153 lb)  08/22/17 69.4 kg (153 lb)     Intake/Output Summary (Last 24 hours) at 10/06/2017 1551 Last data filed at 10/06/2017 1500 Gross per 24 hour  Intake 2955 ml  Output -  Net 2955 ml     Physical Exam  Awake and alert, Oriented x 3, in nad. [place].AT,PERRAL Supple Neck,No JVD, No cervical lymphadenopathy appriciated.  Symmetrical Chest wall movement, Good air movement bilaterally, CTAB RRR,No Gallops,Rubs or new Murmurs, No Parasternal Heave +ve B.Sounds, Abd Soft, No tenderness, No organomegaly appriciated, No rebound - guarding or rigidity. No Cyanosis, Clubbing or edema, No new Rash or bruise   colostomy    Data Review:    CBC Recent Labs  Lab 09/30/17 0012 10/03/17 1236 10/04/17 0616 10/05/17 0625  WBC 8.7 9.7 8.7 10.3  HGB 10.3* 10.4* 9.1* 9.9*  HCT 31.3* 32.2* 28.4* 29.6*  PLT 461* 472* 421* 442*  MCV 84.6 85.0 85.5 85.3  MCH 27.8 27.4 27.4 28.5  MCHC 32.9 32.3 32.0 33.4  RDW 16.4* 16.1* 16.2* 16.2*    Chemistries  Recent Labs  Lab 09/30/17 0012 10/03/17 1236 10/04/17 0616 10/05/17 0625 10/05/17 2355  NA 138 138 140 138 135  K 3.6 3.6 3.2* 3.3* 4.1  CL 104 102 106 104 105  CO2 23 26 25 24 23   GLUCOSE 128* 118* 94 105* 132*  BUN  10 8 6  5* 7  CREATININE 0.65 0.73 0.61 0.59 0.63  CALCIUM 9.8 9.7 9.0 9.1 8.7*  MG  --   --  1.7  --   --   AST 15 13*  --  13*  --   ALT 8* 8*  --  8*  --   ALKPHOS 82 79  --  67  --   BILITOT 0.5 0.5  --  0.4  --    ------------------------------------------------------------------------------------------------------------------ No results for input(s): CHOL, HDL, LDLCALC, TRIG, CHOLHDL, LDLDIRECT in the last 72 hours.  No results found for: HGBA1C ------------------------------------------------------------------------------------------------------------------ No results for input(s): TSH, T4TOTAL, T3FREE, THYROIDAB in the last 72 hours.  Invalid input(s): FREET3 ------------------------------------------------------------------------------------------------------------------ No results for input(s): VITAMINB12, FOLATE, FERRITIN, TIBC, IRON, RETICCTPCT in the last 72 hours.  Coagulation profile Recent Labs  Lab 10/05/17 0625  INR 1.06    No results for input(s): DDIMER in the last 72 hours.  Cardiac Enzymes No results for input(s): CKMB, TROPONINI, MYOGLOBIN in the last 168 hours.  Invalid input(s): CK ------------------------------------------------------------------------------------------------------------------ No results found for: BNP  Inpatient Medications  Scheduled Meds: .  diclofenac sodium  2 g Topical QID  . diltiazem  300 mg Oral Daily  . dronabinol  2.5 mg Oral BID AC  . lactose free nutrition  237 mL Oral BID BM  . levothyroxine  50 mcg Oral QAC breakfast  . losartan  100 mg Oral Daily  . pantoprazole  40 mg Oral Daily   Continuous Infusions: . sodium chloride 75 mL/hr at 10/06/17 0806   PRN Meds:.acetaminophen **OR** acetaminophen, ALPRAZolam, hydrALAZINE, HYDROcodone-acetaminophen, morphine injection, ondansetron **OR** ondansetron (ZOFRAN) IV, polyethylene glycol, traMADol  Micro Results No results found for this or any previous visit (from the  past 240 hour(s)).  Radiology Reports Ct Abdomen Pelvis W Contrast  Result Date: 10/03/2017 CLINICAL DATA:  Rectal bleeding.  Malaise. HPIPt had surgery 1 month ago associated with a pelvic /colonic mass. Pt had a diverting colostomy. She did not have the mass removed. Pt states since then she continues to have pain in the pelvic region . She also is having blood from her rectum ever since the surgery but it seems to be getting worse and the bleeding is more severe. She has been seeing her surgeons and She had a flexible sigmoidoscopy on Friday but they did not determine the cause. Pt is concerned because it is taking long to figure out what is going on. They still dont have a tissue diagnosis. Pt called the surgeon today and was told to come to the ED. Pt feels like she is getting worse. PT did not take any of her medications today. EXAM: CT ABDOMEN AND PELVIS WITH CONTRAST TECHNIQUE: Multidetector CT imaging of the abdomen and pelvis was performed using the standard protocol following bolus administration of intravenous contrast. CONTRAST:  125m ISOVUE-300 IOPAMIDOL (ISOVUE-300) INJECTION 61% COMPARISON:  08/18/2017 FINDINGS: Lower chest: 4 mm pleural-based nodular opacity in the lateral left lower lobe, image 3, series 4, above the field of view from the most recent prior study. Present and similar in size on a CT dated 01/14/2016 consistent with a benign nodule. No acute findings. Heart normal in size. Hepatobiliary: No liver masses. Focal fat noted adjacent to the falciform ligament. No other lesions. Liver normal in size. A few small dependent densities are noted in the gallbladder consistent with stones. No wall thickening or inflammation. No bile duct dilation. Pancreas: Unremarkable. No pancreatic ductal dilatation or surrounding inflammatory changes. Spleen: Normal in size without focal abnormality. Adrenals/Urinary Tract: No adrenal masses. There is mild-to-moderate left renal collecting system  dilation. A stent extends from the left renal pelvis through the left ureter to the bladder, well positioned. No right hydronephrosis. Small, 11 mm, lower pole renal cyst. No other renal masses. No intrarenal stones. Right ureters normal in course and in caliber. Bladder is deviated by the large pelvic mass, but otherwise unremarkable. Stomach/Bowel: A large heterogeneous mass arises from the sigmoid colon and extends to the pelvic sidewall involving the ileo psoas muscle. Mass measures approximately 6.9 cm from superior to inferior by 7 cm x 5.5 cm transversely. This has increased in size from the prior exam. No other colonic masses. There are adjacent inflammatory changes and several prominent, subcentimeter lymph nodes. Multiple diverticula are noted along the colon, stable. A diverting loop colostomy has been performed in utilizing the mid transverse colon. Stomach shows a masslike area arising from the cardia extending to the gastroesophageal junction similar to the prior exam. This may be from previous hiatal hernia surgery or reflect a prominent fold. It is felt unlikely reflect neoplastic disease since  it is stable when compared to 01/14/2016 CT. Small bowel is unremarkable. Vascular/Lymphatic: There are few prominent to mildly enlarged pelvic and retroperitoneal lymph nodes. Largest measuring 1 cm lying to the left of the aortic bifurcation. This node is increased in size from the prior CT where it measured 6 mm. Reproductive: Uterus is surgically absent. Other: Hazy inflammatory change is noted in the pelvis that has increased from the prior exam. No ascites. Musculoskeletal: No fracture or acute finding. No osteoblastic or osteolytic lesions. IMPRESSION: 1. No acute findings. 2. 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 It extends to involve the left pelvic sidewall, specifically the ileo psoas muscle. There are now inflammatory type changes adjacent to the mass that have increased from  prior exam. There are multiple prominent to borderline enlarged lymph nodes several increased in size from the prior exam. No other evidence of metastatic disease. 3. Loop colostomy lies in transverse colon has been performed since the prior study. No evidence of bowel obstruction. 4. There is left hydronephrosis despite a well-positioned left ureteral stent. No right hydronephrosis. 5. Few small gallstones.  Aortic atherosclerosis. 6. Masslike prominence in the gastric cardia is most likely a prominent fold. This could be from previous hiatal hernia surgery if this has been performed in the past. The appearance of this portion of the stomach is stable from prior CTs dating back to August 2017. Electronically Signed   By: Lajean Manes M.D.   On: 10/03/2017 21:33   Ct Biopsy  Result Date: 10/05/2017 INDICATION: 77 year old female with a history of locally invasive exophytic sigmoid colonic mass highly concerning for adenocarcinoma. She has not undergone prior biopsy and presents for CT-guided biopsy to obtain tissue diagnosis. EXAM: CT guided tissue biopsy of left pelvic mass MEDICATIONS: None. ANESTHESIA/SEDATION: Moderate (conscious) sedation was employed during this procedure. A total of Versed 2 mg and Fentanyl 100 mcg was administered intravenously. Moderate Sedation Time: 12 minutes. The patient's level of consciousness and vital signs were monitored continuously by radiology nursing throughout the procedure under my direct supervision. FLUOROSCOPY TIME:  Fluoroscopy Time: 0 minutes 0 seconds (0 mGy). COMPLICATIONS: None immediate. PROCEDURE: Informed written consent was obtained from the patient after a thorough discussion of the procedural risks, benefits and alternatives. All questions were addressed. A timeout was performed prior to the initiation of the procedure. A planning axial CT scan was performed. The exophytic mass was identified in the left lower quadrant. A suitable skin entry site was  selected medial to the inferior epigastric vessels. The overlying skin was prepped and marked. Under intermittent CT guidance, a 17 gauge introducer needle was advanced into the margin of the mass. Multiple 18 gauge core biopsies were then coaxially obtained and placed in formalin before being delivered to pathology for further analysis. The introducer needle was removed. Post biopsy axial CT imaging demonstrates no evidence of immediate complication. Patient tolerated the procedure well. IMPRESSION: Technically successful CT-guided biopsy of left pelvic mass. Electronically Signed   By: Jacqulynn Cadet M.D.   On: 10/05/2017 09:21    Time Spent in minutes  30 min   Velvet Bathe M.D on 10/06/2017 at 3:51 PM  Between 7am to 7pm - Pager - [phone]  After 7pm go to [email or web address] - password [email or web address]  [facility] -  Office  [phone]

## 2017-10-07 DIAGNOSIS — K921 Melena: Secondary | ICD-10-CM | POA: Diagnosis not present

## 2017-10-07 DIAGNOSIS — M25552 Pain in left hip: Secondary | ICD-10-CM | POA: Diagnosis not present

## 2017-10-07 DIAGNOSIS — I10 Essential (primary) hypertension: Secondary | ICD-10-CM | POA: Diagnosis present

## 2017-10-07 DIAGNOSIS — R600 Localized edema: Secondary | ICD-10-CM | POA: Diagnosis not present

## 2017-10-07 DIAGNOSIS — C784 Secondary malignant neoplasm of small intestine: Secondary | ICD-10-CM | POA: Diagnosis not present

## 2017-10-07 DIAGNOSIS — C785 Secondary malignant neoplasm of large intestine and rectum: Secondary | ICD-10-CM | POA: Diagnosis present

## 2017-10-07 DIAGNOSIS — I824Y2 Acute embolism and thrombosis of unspecified deep veins of left proximal lower extremity: Secondary | ICD-10-CM | POA: Diagnosis not present

## 2017-10-07 DIAGNOSIS — I82402 Acute embolism and thrombosis of unspecified deep veins of left lower extremity: Secondary | ICD-10-CM | POA: Diagnosis present

## 2017-10-07 DIAGNOSIS — Z90710 Acquired absence of both cervix and uterus: Secondary | ICD-10-CM | POA: Diagnosis not present

## 2017-10-07 DIAGNOSIS — J45909 Unspecified asthma, uncomplicated: Secondary | ICD-10-CM | POA: Diagnosis present

## 2017-10-07 DIAGNOSIS — D649 Anemia, unspecified: Secondary | ICD-10-CM | POA: Diagnosis not present

## 2017-10-07 DIAGNOSIS — M7989 Other specified soft tissue disorders: Secondary | ICD-10-CM | POA: Diagnosis not present

## 2017-10-07 DIAGNOSIS — M1990 Unspecified osteoarthritis, unspecified site: Secondary | ICD-10-CM | POA: Diagnosis present

## 2017-10-07 DIAGNOSIS — Z5111 Encounter for antineoplastic chemotherapy: Secondary | ICD-10-CM | POA: Diagnosis not present

## 2017-10-07 DIAGNOSIS — I82492 Acute embolism and thrombosis of other specified deep vein of left lower extremity: Secondary | ICD-10-CM | POA: Diagnosis not present

## 2017-10-07 DIAGNOSIS — E876 Hypokalemia: Secondary | ICD-10-CM | POA: Diagnosis present

## 2017-10-07 DIAGNOSIS — Z933 Colostomy status: Secondary | ICD-10-CM | POA: Diagnosis not present

## 2017-10-07 DIAGNOSIS — K625 Hemorrhage of anus and rectum: Secondary | ICD-10-CM | POA: Diagnosis present

## 2017-10-07 DIAGNOSIS — K5730 Diverticulosis of large intestine without perforation or abscess without bleeding: Secondary | ICD-10-CM | POA: Diagnosis present

## 2017-10-07 DIAGNOSIS — M7072 Other bursitis of hip, left hip: Secondary | ICD-10-CM | POA: Diagnosis present

## 2017-10-07 DIAGNOSIS — R971 Elevated cancer antigen 125 [CA 125]: Secondary | ICD-10-CM | POA: Diagnosis not present

## 2017-10-07 DIAGNOSIS — Z8542 Personal history of malignant neoplasm of other parts of uterus: Secondary | ICD-10-CM | POA: Diagnosis not present

## 2017-10-07 DIAGNOSIS — Z923 Personal history of irradiation: Secondary | ICD-10-CM | POA: Diagnosis not present

## 2017-10-07 DIAGNOSIS — Z86718 Personal history of other venous thrombosis and embolism: Secondary | ICD-10-CM | POA: Diagnosis not present

## 2017-10-07 DIAGNOSIS — R531 Weakness: Secondary | ICD-10-CM | POA: Diagnosis present

## 2017-10-07 DIAGNOSIS — K5900 Constipation, unspecified: Secondary | ICD-10-CM | POA: Diagnosis not present

## 2017-10-07 DIAGNOSIS — I272 Pulmonary hypertension, unspecified: Secondary | ICD-10-CM | POA: Diagnosis present

## 2017-10-07 DIAGNOSIS — D62 Acute posthemorrhagic anemia: Secondary | ICD-10-CM | POA: Diagnosis present

## 2017-10-07 DIAGNOSIS — R970 Elevated carcinoembryonic antigen [CEA]: Secondary | ICD-10-CM | POA: Diagnosis not present

## 2017-10-07 DIAGNOSIS — Z8249 Family history of ischemic heart disease and other diseases of the circulatory system: Secondary | ICD-10-CM | POA: Diagnosis not present

## 2017-10-07 DIAGNOSIS — I2510 Atherosclerotic heart disease of native coronary artery without angina pectoris: Secondary | ICD-10-CM | POA: Diagnosis present

## 2017-10-07 DIAGNOSIS — R630 Anorexia: Secondary | ICD-10-CM | POA: Diagnosis not present

## 2017-10-07 DIAGNOSIS — I4891 Unspecified atrial fibrillation: Secondary | ICD-10-CM | POA: Diagnosis not present

## 2017-10-07 DIAGNOSIS — C799 Secondary malignant neoplasm of unspecified site: Secondary | ICD-10-CM | POA: Diagnosis not present

## 2017-10-07 DIAGNOSIS — D509 Iron deficiency anemia, unspecified: Secondary | ICD-10-CM | POA: Diagnosis not present

## 2017-10-07 DIAGNOSIS — M129 Arthropathy, unspecified: Secondary | ICD-10-CM | POA: Diagnosis not present

## 2017-10-07 DIAGNOSIS — E785 Hyperlipidemia, unspecified: Secondary | ICD-10-CM | POA: Diagnosis present

## 2017-10-07 DIAGNOSIS — I82412 Acute embolism and thrombosis of left femoral vein: Secondary | ICD-10-CM | POA: Diagnosis not present

## 2017-10-07 DIAGNOSIS — R110 Nausea: Secondary | ICD-10-CM | POA: Diagnosis not present

## 2017-10-07 DIAGNOSIS — E039 Hypothyroidism, unspecified: Secondary | ICD-10-CM | POA: Diagnosis present

## 2017-10-07 DIAGNOSIS — I480 Paroxysmal atrial fibrillation: Secondary | ICD-10-CM | POA: Diagnosis present

## 2017-10-07 DIAGNOSIS — R1900 Intra-abdominal and pelvic swelling, mass and lump, unspecified site: Secondary | ICD-10-CM | POA: Diagnosis not present

## 2017-10-07 DIAGNOSIS — I083 Combined rheumatic disorders of mitral, aortic and tricuspid valves: Secondary | ICD-10-CM | POA: Diagnosis present

## 2017-10-07 DIAGNOSIS — C55 Malignant neoplasm of uterus, part unspecified: Secondary | ICD-10-CM | POA: Diagnosis not present

## 2017-10-07 DIAGNOSIS — Z951 Presence of aortocoronary bypass graft: Secondary | ICD-10-CM | POA: Diagnosis not present

## 2017-10-07 DIAGNOSIS — R627 Adult failure to thrive: Secondary | ICD-10-CM | POA: Diagnosis present

## 2017-10-07 DIAGNOSIS — C541 Malignant neoplasm of endometrium: Secondary | ICD-10-CM | POA: Diagnosis present

## 2017-10-07 DIAGNOSIS — I82812 Embolism and thrombosis of superficial veins of left lower extremities: Secondary | ICD-10-CM | POA: Diagnosis present

## 2017-10-07 NOTE — Progress Notes (Signed)
Patient ID: NOHELY WHITEHORN, female   DOB: 09-02-1940, 77 y.o.   MRN: [id number]                                                                PROGRESS NOTE                                                                                                                                                                                                             Patient Demographics:    Bradie Sangiovanni, is a 77 y.o. female, DOB - 08-22-1940, ZGY:[id number]  Admit date - 10/03/2017   Admitting Physician Norval Morton, MD  Outpatient Primary MD for the patient is Leeroy Cha, MD  LOS - 0  Outpatient Specialists:     No chief complaint on file.      Brief Narrative   77 y.o. female with medical history significant of CAD s/p CABG, endometrial cancer, A. fib with RVR, and  hypothyroidism; who presents with complaints of generalized weakness and continued rectal bleeding.   She just been recently hospitalized 3/21-3/29, after coming in with complaints of abdominal pain with nausea and vomiting.  Patient was found to have a ill-defined heterogeneous mass involving the sigmoid colon of the pelvis with signs of possible perforation and abscess during the hospitalization patient was evaluated by general surgery,  cardiology, urology, and orthopedic surgery.  She underwent JJ stenting and laparoscopic diverting loop colostomy by Dr. Excell Seltzer on 3/24.  She was initially discharged to a rehab facility, but has been home for a while.  Since being hospitalized patient reports still having rectal bleeding.  She will get the urge to have a bowel movement and when she goes notes clots of dark blood present.  Patient going once or twice per day.  She notes that she is just had generalized weakness and felt as though she may pass out.  Denies having any loss of consciousness or recent falls.  Her biggest complaint is continued left-sided hip pain that runs down her leg and into her groin area.   Just 3 days ago, she had a scope by Dr. Collene Mares of gastroenterology, but the mass was not able to be reached.  Patient notes her weakness symptoms were so severe today to the point which she was unable to  get around and she came in for further evaluation.  Denies having any significant chest pain, shortness of breath, loss of consciousness, fall, fever, chills, nausea, or vomiting.  ED Course: Upon admission into the emergency department the patient was found to be 164/104-196/77, and all other vitals within normal limits. Labs revealed WBC 9.7, hemoglobin 10.4, platelets 472 and other labs relatively within normal limits.  General surgery and gastroenterology were notified and recommended admission for observation overnight with likely need of repeat CT scan and interventional radiology.  [facility] called to admit.   Subjective:    Aisley Whan   Pt reports no new complaints currently.     Assessment  & Plan :    Principal Problem:   Rectal bleeding Active Problems:   Hypothyroid   Essential hypertension   Normocytic anemia   Left hip pain   Generalized weakness  Rectal bleeding, pelvic mass: Patient presents with complaints of weakness and continued rectal bleeding.  CT scan from 3/21, sigmoid colon of the pelvis with signs of possible perforation and abscess Hemoglobin appears to be stable.  Patient found to have left lower lobe status post diverting loop colonoscopy on 3/24.  Recent scope was unable to obtain tissue biopsy.(Dr. Collene Mares)   Radiology was called and recommended repeat CT scan 5/6.=>  IMPRESSION: 1. No acute findings. 2. 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 It extends to involve the left pelvic sidewall, specifically the ileo psoas muscle. There are now inflammatory type changes adjacent to the mass that have increased from prior exam. There are multiple prominent to borderline enlarged lymph nodes several increased in size from the prior exam. No other  evidence of metastatic disease. 3. Loop colostomy lies in transverse colon has been performed since the prior study. No evidence of bowel obstruction. 4. There is left hydronephrosis despite a well-positioned left ureteral stent. No right hydronephrosis. 5. Few small gallstones.  Aortic atherosclerosis. 6. Masslike prominence in the gastric cardia is most likely a prominent fold. This could be from previous hiatal hernia surgery if this has been performed in the past. The appearance of this portion of the stomach is stable from prior CTs dating back to August 2017.  - Consulted interventional radiology and patient is s/p bx - surgery consulted and no plans for further work up, I am awaiting pathology reports prior to consulting oncologist.  Place care order instruction for nursing to help track down the biopsy results  Generalized weakness - improving with IVF's. Will continue for 1 more day -Most likely secondary to poor oral intake  Poor oral intake -Pulses risk to deterioration after hospital discharge but is improving with recent Marinol administration.  Hypokalemia -Resolved on last check  Normocytic normochromic anemia: Patient's hemoglobin on admission noted to be 10.4 which appears close to last check on 5/3.  Patient was typed and screened for possible need of blood products. - Check cbc in am  Left hip pain:  - Continue tramadol prn pain  Essential hypertension: Initially uncontrolled blood pressures elevated up to 196/77.  Patient was given home blood pressure medications. - Continue losartan - Hydralazine IV prn SBP>180 or dBP> 110   Paroxysmal atrial fibrillation: CHA2DS2-VASc score = 4.  Patient was not put on anticoagulation due to recent ostomy.  Now patient has rectal bleeding as an additional cause for not being placed on anticoagulant.    - Continue diltiazem   CAD s/p CABG   H/O endometrial cancer  Hypothyroidism - TSH pending -  Continue  levothyroxine  DVT prophylaxis: SCD   Code Status: Full Family Communication: Discussed plan of care with the patient  Disposition Plan: Likely discharge home once medically stable Consults called: None  Admission status: Observation   Lab Results  Component Value Date   PLT 442 (H) 10/05/2017    Antibiotics  :  none  Anti-infectives (From admission, onward)   None        Objective:   Vitals:   10/06/17 0500 10/06/17 1431 10/07/17 0417 10/07/17 0629  BP:  (!) 138/50 (!) 143/43   Pulse:  69 72   Resp:  16 16   Temp: 99 F (37.2 C) 98.7 F (37.1 C) 98.5 F (36.9 C)   TempSrc: Oral  Oral   SpO2:  95% 95%   Weight:    66.9 kg (147 lb 7.8 oz)  Height:        Wt Readings from Last 3 Encounters:  10/07/17 66.9 kg (147 lb 7.8 oz)  08/29/17 69.4 kg (153 lb)  08/22/17 69.4 kg (153 lb)     Intake/Output Summary (Last 24 hours) at 10/07/2017 1335 Last data filed at 10/07/2017 1234 Gross per 24 hour  Intake 2832.5 ml  Output 0 ml  Net 2832.5 ml     Physical Exam  Awake and alert, Oriented x 3, in nad. Bartholomew.AT,PERRAL Supple Neck,No JVD, No cervical lymphadenopathy appriciated.  Symmetrical Chest wall movement, Good air movement bilaterally, CTAB RRR,No Gallops,Rubs or new Murmurs, No Parasternal Heave +ve B.Sounds, Abd Soft, No tenderness, No organomegaly appriciated, No rebound - guarding or rigidity. No Cyanosis, Clubbing or edema, No new Rash or bruise   colostomy    Data Review:    CBC Recent Labs  Lab 10/03/17 1236 10/04/17 0616 10/05/17 0625  WBC 9.7 8.7 10.3  HGB 10.4* 9.1* 9.9*  HCT 32.2* 28.4* 29.6*  PLT 472* 421* 442*  MCV 85.0 85.5 85.3  MCH 27.4 27.4 28.5  MCHC 32.3 32.0 33.4  RDW 16.1* 16.2* 16.2*    Chemistries  Recent Labs  Lab 10/03/17 1236 10/04/17 0616 10/05/17 0625 10/05/17 2355  NA 138 140 138 135  K 3.6 3.2* 3.3* 4.1  CL 102 106 104 105  CO2 26 25 24 23   GLUCOSE 118* 94 105* 132*  BUN 8 6 5* 7  CREATININE 0.73 0.61  0.59 0.63  CALCIUM 9.7 9.0 9.1 8.7*  MG  --  1.7  --   --   AST 13*  --  13*  --   ALT 8*  --  8*  --   ALKPHOS 79  --  67  --   BILITOT 0.5  --  0.4  --    ------------------------------------------------------------------------------------------------------------------ No results for input(s): CHOL, HDL, LDLCALC, TRIG, CHOLHDL, LDLDIRECT in the last 72 hours.  No results found for: HGBA1C ------------------------------------------------------------------------------------------------------------------ No results for input(s): TSH, T4TOTAL, T3FREE, THYROIDAB in the last 72 hours.  Invalid input(s): FREET3 ------------------------------------------------------------------------------------------------------------------ No results for input(s): VITAMINB12, FOLATE, FERRITIN, TIBC, IRON, RETICCTPCT in the last 72 hours.  Coagulation profile Recent Labs  Lab 10/05/17 0625  INR 1.06    No results for input(s): DDIMER in the last 72 hours.  Cardiac Enzymes No results for input(s): CKMB, TROPONINI, MYOGLOBIN in the last 168 hours.  Invalid input(s): CK ------------------------------------------------------------------------------------------------------------------ No results found for: BNP  Inpatient Medications  Scheduled Meds: . diclofenac sodium  2 g Topical QID  . diltiazem  300 mg Oral Daily  . dronabinol  2.5 mg Oral BID AC  .  lactose free nutrition  237 mL Oral BID BM  . levothyroxine  50 mcg Oral QAC breakfast  . losartan  100 mg Oral Daily  . pantoprazole  40 mg Oral Daily   Continuous Infusions: . sodium chloride 75 mL/hr at 10/07/17 1117   PRN Meds:.acetaminophen **OR** acetaminophen, ALPRAZolam, hydrALAZINE, HYDROcodone-acetaminophen, morphine injection, ondansetron **OR** ondansetron (ZOFRAN) IV, polyethylene glycol, traMADol  Micro Results No results found for this or any previous visit (from the past 240 hour(s)).  Radiology Reports Ct Abdomen Pelvis W  Contrast  Result Date: 10/03/2017 CLINICAL DATA:  Rectal bleeding.  Malaise. HPIPt had surgery 1 month ago associated with a pelvic /colonic mass. Pt had a diverting colostomy. She did not have the mass removed. Pt states since then she continues to have pain in the pelvic region . She also is having blood from her rectum ever since the surgery but it seems to be getting worse and the bleeding is more severe. She has been seeing her surgeons and She had a flexible sigmoidoscopy on Friday but they did not determine the cause. Pt is concerned because it is taking long to figure out what is going on. They still dont have a tissue diagnosis. Pt called the surgeon today and was told to come to the ED. Pt feels like she is getting worse. PT did not take any of her medications today. EXAM: CT ABDOMEN AND PELVIS WITH CONTRAST TECHNIQUE: Multidetector CT imaging of the abdomen and pelvis was performed using the standard protocol following bolus administration of intravenous contrast. CONTRAST:  13m ISOVUE-300 IOPAMIDOL (ISOVUE-300) INJECTION 61% COMPARISON:  08/18/2017 FINDINGS: Lower chest: 4 mm pleural-based nodular opacity in the lateral left lower lobe, image 3, series 4, above the field of view from the most recent prior study. Present and similar in size on a CT dated 01/14/2016 consistent with a benign nodule. No acute findings. Heart normal in size. Hepatobiliary: No liver masses. Focal fat noted adjacent to the falciform ligament. No other lesions. Liver normal in size. A few small dependent densities are noted in the gallbladder consistent with stones. No wall thickening or inflammation. No bile duct dilation. Pancreas: Unremarkable. No pancreatic ductal dilatation or surrounding inflammatory changes. Spleen: Normal in size without focal abnormality. Adrenals/Urinary Tract: No adrenal masses. There is mild-to-moderate left renal collecting system dilation. A stent extends from the left renal pelvis through the  left ureter to the bladder, well positioned. No right hydronephrosis. Small, 11 mm, lower pole renal cyst. No other renal masses. No intrarenal stones. Right ureters normal in course and in caliber. Bladder is deviated by the large pelvic mass, but otherwise unremarkable. Stomach/Bowel: A large heterogeneous mass arises from the sigmoid colon and extends to the pelvic sidewall involving the ileo psoas muscle. Mass measures approximately 6.9 cm from superior to inferior by 7 cm x 5.5 cm transversely. This has increased in size from the prior exam. No other colonic masses. There are adjacent inflammatory changes and several prominent, subcentimeter lymph nodes. Multiple diverticula are noted along the colon, stable. A diverting loop colostomy has been performed in utilizing the mid transverse colon. Stomach shows a masslike area arising from the cardia extending to the gastroesophageal junction similar to the prior exam. This may be from previous hiatal hernia surgery or reflect a prominent fold. It is felt unlikely reflect neoplastic disease since it is stable when compared to 01/14/2016 CT. Small bowel is unremarkable. Vascular/Lymphatic: There are few prominent to mildly enlarged pelvic and retroperitoneal lymph nodes. Largest  measuring 1 cm lying to the left of the aortic bifurcation. This node is increased in size from the prior CT where it measured 6 mm. Reproductive: Uterus is surgically absent. Other: Hazy inflammatory change is noted in the pelvis that has increased from the prior exam. No ascites. Musculoskeletal: No fracture or acute finding. No osteoblastic or osteolytic lesions. IMPRESSION: 1. No acute findings. 2. 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 It extends to involve the left pelvic sidewall, specifically the ileo psoas muscle. There are now inflammatory type changes adjacent to the mass that have increased from prior exam. There are multiple prominent to borderline enlarged  lymph nodes several increased in size from the prior exam. No other evidence of metastatic disease. 3. Loop colostomy lies in transverse colon has been performed since the prior study. No evidence of bowel obstruction. 4. There is left hydronephrosis despite a well-positioned left ureteral stent. No right hydronephrosis. 5. Few small gallstones.  Aortic atherosclerosis. 6. Masslike prominence in the gastric cardia is most likely a prominent fold. This could be from previous hiatal hernia surgery if this has been performed in the past. The appearance of this portion of the stomach is stable from prior CTs dating back to August 2017. Electronically Signed   By: Lajean Manes M.D.   On: 10/03/2017 21:33   Ct Biopsy  Result Date: 10/05/2017 INDICATION: 77 year old female with a history of locally invasive exophytic sigmoid colonic mass highly concerning for adenocarcinoma. She has not undergone prior biopsy and presents for CT-guided biopsy to obtain tissue diagnosis. EXAM: CT guided tissue biopsy of left pelvic mass MEDICATIONS: None. ANESTHESIA/SEDATION: Moderate (conscious) sedation was employed during this procedure. A total of Versed 2 mg and Fentanyl 100 mcg was administered intravenously. Moderate Sedation Time: 12 minutes. The patient's level of consciousness and vital signs were monitored continuously by radiology nursing throughout the procedure under my direct supervision. FLUOROSCOPY TIME:  Fluoroscopy Time: 0 minutes 0 seconds (0 mGy). COMPLICATIONS: None immediate. PROCEDURE: Informed written consent was obtained from the patient after a thorough discussion of the procedural risks, benefits and alternatives. All questions were addressed. A timeout was performed prior to the initiation of the procedure. A planning axial CT scan was performed. The exophytic mass was identified in the left lower quadrant. A suitable skin entry site was selected medial to the inferior epigastric vessels. The overlying skin  was prepped and marked. Under intermittent CT guidance, a 17 gauge introducer needle was advanced into the margin of the mass. Multiple 18 gauge core biopsies were then coaxially obtained and placed in formalin before being delivered to pathology for further analysis. The introducer needle was removed. Post biopsy axial CT imaging demonstrates no evidence of immediate complication. Patient tolerated the procedure well. IMPRESSION: Technically successful CT-guided biopsy of left pelvic mass. Electronically Signed   By: Jacqulynn Cadet M.D.   On: 10/05/2017 09:21    Time Spent in minutes  30 min   Velvet Bathe M.D on 10/07/2017 at 1:35 PM  Between 7am to 7pm - Pager - [phone]  After 7pm go to [email or web address] - password [email or web address]  [facility] -  Office  [phone]

## 2017-10-08 NOTE — Progress Notes (Signed)
Patient ID: Shannon Scott, female   DOB: Nov 08, 1940, 77 y.o.   MRN: [id number]                                                                PROGRESS NOTE                                                                                                                                                                                                             Patient Demographics:    Shannon Scott, is a 77 y.o. female, DOB - Nov 29, 1940, [id number]  Admit date - 10/03/2017   Admitting Physician Norval Morton, MD  Outpatient Primary MD for the patient is Leeroy Cha, MD  LOS - 1  Outpatient Specialists:     No chief complaint on file.      Brief Narrative   77 y.o. female with medical history significant of CAD s/p CABG, endometrial cancer, A. fib with RVR, and  hypothyroidism; who presents with complaints of generalized weakness and continued rectal bleeding.   She just been recently hospitalized 3/21-3/29, after coming in with complaints of abdominal pain with nausea and vomiting.  Patient was found to have a ill-defined heterogeneous mass involving the sigmoid colon of the pelvis with signs of possible perforation and abscess during the hospitalization patient was evaluated by general surgery,  cardiology, urology, and orthopedic surgery.  She underwent JJ stenting and laparoscopic diverting loop colostomy by Dr. Excell Seltzer on 3/24.  She was initially discharged to a rehab facility, but has been home for a while.  Since being hospitalized patient reports still having rectal bleeding.  She will get the urge to have a bowel movement and when she goes notes clots of dark blood present.  Patient going once or twice per day.  She notes that she is just had generalized weakness and felt as though she may pass out.  Denies having any loss of consciousness or recent falls.  Her biggest complaint is continued left-sided hip pain that runs down her leg and into her groin area.   Just 3 days ago, she had a scope by Dr. Collene Mares of gastroenterology, but the mass was not able to be reached.  Patient notes her weakness symptoms were so severe today to the point which she was unable to  get around and she came in for further evaluation.  Denies having any significant chest pain, shortness of breath, loss of consciousness, fall, fever, chills, nausea, or vomiting.  ED Course: Upon admission into the emergency department the patient was found to be 164/104-196/77, and all other vitals within normal limits. Labs revealed WBC 9.7, hemoglobin 10.4, platelets 472 and other labs relatively within normal limits.  General surgery and gastroenterology were notified and recommended admission for observation overnight with likely need of repeat CT scan and interventional radiology.  [facility] called to admit.   Subjective:    Shannon Scott   Pt reports no new complaints currently.     Assessment  & Plan :    Principal Problem:   Rectal bleeding Active Problems:   Hypothyroid   Essential hypertension   Normocytic anemia   Left hip pain   Generalized weakness  Rectal bleeding, pelvic mass: Patient presents with complaints of weakness and continued rectal bleeding.  CT scan from 3/21, sigmoid colon of the pelvis with signs of possible perforation and abscess Hemoglobin appears to be stable.  Patient found to have left lower lobe status post diverting loop colonoscopy on 3/24.  Recent scope was unable to obtain tissue biopsy.(Dr. Collene Mares)   Radiology was called and recommended repeat CT scan 5/6.=>  IMPRESSION: 1. No acute findings. 2. 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 It extends to involve the left pelvic sidewall, specifically the ileo psoas muscle. There are now inflammatory type changes adjacent to the mass that have increased from prior exam. There are multiple prominent to borderline enlarged lymph nodes several increased in size from the prior exam. No other  evidence of metastatic disease. 3. Loop colostomy lies in transverse colon has been performed since the prior study. No evidence of bowel obstruction. 4. There is left hydronephrosis despite a well-positioned left ureteral stent. No right hydronephrosis. 5. Few small gallstones.  Aortic atherosclerosis. 6. Masslike prominence in the gastric cardia is most likely a prominent fold. This could be from previous hiatal hernia surgery if this has been performed in the past. The appearance of this portion of the stomach is stable from prior CTs dating back to August 2017.  - Consulted interventional radiology and patient is s/p bx. bx results are back reporting suspicion for adenocarcinoma of gynecological origin.  - Consulted oncologist who will round on patient   Generalized weakness - improving with IVF's. Will continue for 1 more day -Most likely secondary to poor oral intake  Poor oral intake -Pulses risk to deterioration after hospital discharge but is improving with recent Marinol administration.  Hypokalemia -Resolved on last check  Normocytic normochromic anemia: Patient's hemoglobin on admission noted to be 10.4 which appears close to last check on 5/3.  Patient was typed and screened for possible need of blood products. - Check cbc in am  Left hip pain:  - Continue tramadol prn pain  Essential hypertension: Initially uncontrolled blood pressures elevated up to 196/77.  Patient was given home blood pressure medications. - Continue losartan - Hydralazine IV prn SBP>180 or dBP> 110   Paroxysmal atrial fibrillation: CHA2DS2-VASc score = 4.  Patient was not put on anticoagulation due to recent ostomy.  Now patient has rectal bleeding as an additional cause for not being placed on anticoagulant.    - Continue diltiazem   CAD s/p CABG   H/O endometrial cancer  Hypothyroidism - TSH pending - Continue levothyroxine  DVT prophylaxis: SCD   Code Status: Full  Family  Communication: Discussed plan of care with the patient  Disposition Plan: Discharge patient after oncology evaluation. Consults called: None  Admission status: Observation   Lab Results  Component Value Date   PLT 442 (H) 10/05/2017    Antibiotics  :  none  Anti-infectives (From admission, onward)   None        Objective:   Vitals:   10/07/17 0629 10/07/17 1439 10/07/17 2016 10/08/17 0422  BP:  (!) 129/52 (!) 124/46 (!) 136/48  Pulse:  72 82 (!) 59  Resp:  16 20 18   Temp:  98.2 F (36.8 C) 99.1 F (37.3 C) 98.2 F (36.8 C)  TempSrc:  Oral Oral Oral  SpO2:  96% 95% 96%  Weight: 66.9 kg (147 lb 7.8 oz)     Height:        Wt Readings from Last 3 Encounters:  10/07/17 66.9 kg (147 lb 7.8 oz)  08/29/17 69.4 kg (153 lb)  08/22/17 69.4 kg (153 lb)     Intake/Output Summary (Last 24 hours) at 10/08/2017 1450 Last data filed at 10/08/2017 0600 Gross per 24 hour  Intake 1307.5 ml  Output -  Net 1307.5 ml     Physical Exam, exam unchanged when compared to prior 10/07/2017  Awake and alert, Oriented x 3, in nad. [place].AT,PERRAL Supple Neck,No JVD, No cervical lymphadenopathy appriciated.  Symmetrical Chest wall movement, Good air movement bilaterally, CTAB RRR,No Gallops,Rubs or new Murmurs, No Parasternal Heave +ve B.Sounds, Abd Soft, No tenderness, No organomegaly appriciated, No rebound - guarding or rigidity. No Cyanosis, Clubbing or edema, No new Rash or bruise   colostomy    Data Review:    CBC Recent Labs  Lab 10/03/17 1236 10/04/17 0616 10/05/17 0625  WBC 9.7 8.7 10.3  HGB 10.4* 9.1* 9.9*  HCT 32.2* 28.4* 29.6*  PLT 472* 421* 442*  MCV 85.0 85.5 85.3  MCH 27.4 27.4 28.5  MCHC 32.3 32.0 33.4  RDW 16.1* 16.2* 16.2*    Chemistries  Recent Labs  Lab 10/03/17 1236 10/04/17 0616 10/05/17 0625 10/05/17 2355  NA 138 140 138 135  K 3.6 3.2* 3.3* 4.1  CL 102 106 104 105  CO2 26 25 24 23   GLUCOSE 118* 94 105* 132*  BUN 8 6 5* 7  CREATININE  0.73 0.61 0.59 0.63  CALCIUM 9.7 9.0 9.1 8.7*  MG  --  1.7  --   --   AST 13*  --  13*  --   ALT 8*  --  8*  --   ALKPHOS 79  --  67  --   BILITOT 0.5  --  0.4  --    ------------------------------------------------------------------------------------------------------------------ No results for input(s): CHOL, HDL, LDLCALC, TRIG, CHOLHDL, LDLDIRECT in the last 72 hours.  No results found for: HGBA1C ------------------------------------------------------------------------------------------------------------------ No results for input(s): TSH, T4TOTAL, T3FREE, THYROIDAB in the last 72 hours.  Invalid input(s): FREET3 ------------------------------------------------------------------------------------------------------------------ No results for input(s): VITAMINB12, FOLATE, FERRITIN, TIBC, IRON, RETICCTPCT in the last 72 hours.  Coagulation profile Recent Labs  Lab 10/05/17 0625  INR 1.06    No results for input(s): DDIMER in the last 72 hours.  Cardiac Enzymes No results for input(s): CKMB, TROPONINI, MYOGLOBIN in the last 168 hours.  Invalid input(s): CK ------------------------------------------------------------------------------------------------------------------ No results found for: BNP  Inpatient Medications  Scheduled Meds: . diclofenac sodium  2 g Topical QID  . diltiazem  300 mg Oral Daily  . dronabinol  2.5 mg Oral BID AC  . lactose free  nutrition  237 mL Oral BID BM  . levothyroxine  50 mcg Oral QAC breakfast  . losartan  100 mg Oral Daily  . pantoprazole  40 mg Oral Daily   Continuous Infusions: . sodium chloride 75 mL/hr at 10/08/17 0107   PRN Meds:.acetaminophen **OR** acetaminophen, ALPRAZolam, hydrALAZINE, HYDROcodone-acetaminophen, morphine injection, ondansetron **OR** ondansetron (ZOFRAN) IV, polyethylene glycol, traMADol  Micro Results No results found for this or any previous visit (from the past 240 hour(s)).  Radiology Reports Ct Abdomen  Pelvis W Contrast  Result Date: 10/03/2017 CLINICAL DATA:  Rectal bleeding.  Malaise. HPIPt had surgery 1 month ago associated with a pelvic /colonic mass. Pt had a diverting colostomy. She did not have the mass removed. Pt states since then she continues to have pain in the pelvic region . She also is having blood from her rectum ever since the surgery but it seems to be getting worse and the bleeding is more severe. She has been seeing her surgeons and She had a flexible sigmoidoscopy on Friday but they did not determine the cause. Pt is concerned because it is taking long to figure out what is going on. They still dont have a tissue diagnosis. Pt called the surgeon today and was told to come to the ED. Pt feels like she is getting worse. PT did not take any of her medications today. EXAM: CT ABDOMEN AND PELVIS WITH CONTRAST TECHNIQUE: Multidetector CT imaging of the abdomen and pelvis was performed using the standard protocol following bolus administration of intravenous contrast. CONTRAST:  113m ISOVUE-300 IOPAMIDOL (ISOVUE-300) INJECTION 61% COMPARISON:  08/18/2017 FINDINGS: Lower chest: 4 mm pleural-based nodular opacity in the lateral left lower lobe, image 3, series 4, above the field of view from the most recent prior study. Present and similar in size on a CT dated 01/14/2016 consistent with a benign nodule. No acute findings. Heart normal in size. Hepatobiliary: No liver masses. Focal fat noted adjacent to the falciform ligament. No other lesions. Liver normal in size. A few small dependent densities are noted in the gallbladder consistent with stones. No wall thickening or inflammation. No bile duct dilation. Pancreas: Unremarkable. No pancreatic ductal dilatation or surrounding inflammatory changes. Spleen: Normal in size without focal abnormality. Adrenals/Urinary Tract: No adrenal masses. There is mild-to-moderate left renal collecting system dilation. A stent extends from the left renal pelvis  through the left ureter to the bladder, well positioned. No right hydronephrosis. Small, 11 mm, lower pole renal cyst. No other renal masses. No intrarenal stones. Right ureters normal in course and in caliber. Bladder is deviated by the large pelvic mass, but otherwise unremarkable. Stomach/Bowel: A large heterogeneous mass arises from the sigmoid colon and extends to the pelvic sidewall involving the ileo psoas muscle. Mass measures approximately 6.9 cm from superior to inferior by 7 cm x 5.5 cm transversely. This has increased in size from the prior exam. No other colonic masses. There are adjacent inflammatory changes and several prominent, subcentimeter lymph nodes. Multiple diverticula are noted along the colon, stable. A diverting loop colostomy has been performed in utilizing the mid transverse colon. Stomach shows a masslike area arising from the cardia extending to the gastroesophageal junction similar to the prior exam. This may be from previous hiatal hernia surgery or reflect a prominent fold. It is felt unlikely reflect neoplastic disease since it is stable when compared to 01/14/2016 CT. Small bowel is unremarkable. Vascular/Lymphatic: There are few prominent to mildly enlarged pelvic and retroperitoneal lymph nodes. Largest measuring 1  cm lying to the left of the aortic bifurcation. This node is increased in size from the prior CT where it measured 6 mm. Reproductive: Uterus is surgically absent. Other: Hazy inflammatory change is noted in the pelvis that has increased from the prior exam. No ascites. Musculoskeletal: No fracture or acute finding. No osteoblastic or osteolytic lesions. IMPRESSION: 1. No acute findings. 2. 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 It extends to involve the left pelvic sidewall, specifically the ileo psoas muscle. There are now inflammatory type changes adjacent to the mass that have increased from prior exam. There are multiple prominent to  borderline enlarged lymph nodes several increased in size from the prior exam. No other evidence of metastatic disease. 3. Loop colostomy lies in transverse colon has been performed since the prior study. No evidence of bowel obstruction. 4. There is left hydronephrosis despite a well-positioned left ureteral stent. No right hydronephrosis. 5. Few small gallstones.  Aortic atherosclerosis. 6. Masslike prominence in the gastric cardia is most likely a prominent fold. This could be from previous hiatal hernia surgery if this has been performed in the past. The appearance of this portion of the stomach is stable from prior CTs dating back to August 2017. Electronically Signed   By: Lajean Manes M.D.   On: 10/03/2017 21:33   Ct Biopsy  Result Date: 10/05/2017 INDICATION: 77 year old female with a history of locally invasive exophytic sigmoid colonic mass highly concerning for adenocarcinoma. She has not undergone prior biopsy and presents for CT-guided biopsy to obtain tissue diagnosis. EXAM: CT guided tissue biopsy of left pelvic mass MEDICATIONS: None. ANESTHESIA/SEDATION: Moderate (conscious) sedation was employed during this procedure. A total of Versed 2 mg and Fentanyl 100 mcg was administered intravenously. Moderate Sedation Time: 12 minutes. The patient's level of consciousness and vital signs were monitored continuously by radiology nursing throughout the procedure under my direct supervision. FLUOROSCOPY TIME:  Fluoroscopy Time: 0 minutes 0 seconds (0 mGy). COMPLICATIONS: None immediate. PROCEDURE: Informed written consent was obtained from the patient after a thorough discussion of the procedural risks, benefits and alternatives. All questions were addressed. A timeout was performed prior to the initiation of the procedure. A planning axial CT scan was performed. The exophytic mass was identified in the left lower quadrant. A suitable skin entry site was selected medial to the inferior epigastric vessels.  The overlying skin was prepped and marked. Under intermittent CT guidance, a 17 gauge introducer needle was advanced into the margin of the mass. Multiple 18 gauge core biopsies were then coaxially obtained and placed in formalin before being delivered to pathology for further analysis. The introducer needle was removed. Post biopsy axial CT imaging demonstrates no evidence of immediate complication. Patient tolerated the procedure well. IMPRESSION: Technically successful CT-guided biopsy of left pelvic mass. Electronically Signed   By: Jacqulynn Cadet M.D.   On: 10/05/2017 09:21    Time Spent in minutes  30 min   Velvet Bathe M.D on 10/08/2017 at 2:50 PM  Between 7am to 7pm - Pager - [phone]  After 7pm go to [email or web address] - password [facility]  [facility] -  Office  [phone]

## 2017-10-09 DIAGNOSIS — E039 Hypothyroidism, unspecified: Secondary | ICD-10-CM

## 2017-10-09 DIAGNOSIS — E785 Hyperlipidemia, unspecified: Secondary | ICD-10-CM

## 2017-10-09 DIAGNOSIS — R110 Nausea: Secondary | ICD-10-CM

## 2017-10-09 DIAGNOSIS — Z79899 Other long term (current) drug therapy: Secondary | ICD-10-CM

## 2017-10-09 DIAGNOSIS — R971 Elevated cancer antigen 125 [CA 125]: Secondary | ICD-10-CM

## 2017-10-09 DIAGNOSIS — R970 Elevated carcinoembryonic antigen [CEA]: Secondary | ICD-10-CM

## 2017-10-09 DIAGNOSIS — K921 Melena: Secondary | ICD-10-CM

## 2017-10-09 DIAGNOSIS — M129 Arthropathy, unspecified: Secondary | ICD-10-CM

## 2017-10-09 DIAGNOSIS — Z8542 Personal history of malignant neoplasm of other parts of uterus: Secondary | ICD-10-CM

## 2017-10-09 DIAGNOSIS — I10 Essential (primary) hypertension: Secondary | ICD-10-CM

## 2017-10-09 DIAGNOSIS — I2510 Atherosclerotic heart disease of native coronary artery without angina pectoris: Secondary | ICD-10-CM

## 2017-10-09 DIAGNOSIS — Z90710 Acquired absence of both cervix and uterus: Secondary | ICD-10-CM

## 2017-10-09 DIAGNOSIS — Z90722 Acquired absence of ovaries, bilateral: Secondary | ICD-10-CM

## 2017-10-09 DIAGNOSIS — M7989 Other specified soft tissue disorders: Secondary | ICD-10-CM

## 2017-10-09 DIAGNOSIS — R1900 Intra-abdominal and pelvic swelling, mass and lump, unspecified site: Secondary | ICD-10-CM

## 2017-10-09 LAB — COMPREHENSIVE METABOLIC PANEL
ALT: 7 U/L — ABNORMAL LOW (ref 14–54)
AST: 18 U/L (ref 15–41)
Albumin: 2.8 g/dL — ABNORMAL LOW (ref 3.5–5.0)
Alkaline Phosphatase: 66 U/L (ref 38–126)
Anion gap: 10 (ref 5–15)
BUN: 6 mg/dL (ref 6–20)
CO2: 24 mmol/L (ref 22–32)
Calcium: 9.2 mg/dL (ref 8.9–10.3)
Chloride: 105 mmol/L (ref 101–111)
Creatinine, Ser: 0.74 mg/dL (ref 0.44–1.00)
GFR calc Af Amer: >60 mL/min (ref >60)
GFR calc non Af Amer: >60 mL/min (ref >60)
Glucose, Bld: 114 mg/dL — ABNORMAL HIGH (ref 65–99)
Potassium: 3.8 mmol/L (ref 3.5–5.1)
Sodium: 139 mmol/L (ref 135–145)
Total Bilirubin: 0.5 mg/dL (ref 0.3–1.2)
Total Protein: 6.9 g/dL (ref 6.5–8.1)

## 2017-10-09 LAB — IRON AND TIBC
Iron: 8 ug/dL — ABNORMAL LOW (ref 28–170)
Saturation Ratios: 4 % — ABNORMAL LOW (ref 10.4–31.8)
TIBC: 199 ug/dL — ABNORMAL LOW (ref 250–450)
UIBC: 191 ug/dL

## 2017-10-09 LAB — FERRITIN: Ferritin: 50 ng/mL (ref 11–307)

## 2017-10-09 MED ORDER — PANTOPRAZOLE SODIUM 40 MG PO TBEC
40.0000 mg | DELAYED_RELEASE_TABLET | Freq: Two times a day (BID) | ORAL | Status: DC
  Administered 2017-10-09 – 2017-10-16 (×13): 40 mg via ORAL
  Filled 2017-10-09 (×14): qty 1

## 2017-10-09 MED ORDER — HEPARIN SODIUM (PORCINE) 5000 UNIT/ML IJ SOLN
5000.0000 [IU] | Freq: Three times a day (TID) | INTRAMUSCULAR | Status: DC
  Administered 2017-10-09 – 2017-10-10 (×3): 5000 [IU] via SUBCUTANEOUS
  Filled 2017-10-09 (×3): qty 1

## 2017-10-09 NOTE — Consult Note (Signed)
Referral MD  Reason for Referral: Abdominal mass-felt to be nonresectable  No chief complaint on file. : I have some bleeding.  HPI: Shannon Scott is a very nice 77 year old white female.  She is a retired [employer] scientist.  She does have history of coronary artery disease.  She has hypertension.  Her history actually dates back to August 2017.  She had a hysterectomy.  At that time, the pathology report showed that she had a invasive uterine adenocarcinoma.  This seemed to be superficially invasive.  It was 1.7 cm in size.  Lymph nodes were all negative.  In March 2019, she had abdominal pain.  This was on the left side.  She had nausea.  She was found to have a large abdominal mass.  She subsequently underwent a diverting colostomy.  No biopsies were taken.  According to the operative report, the tumor could not be seen.  She was sent to a rehab facility.  Somehow, she was never given an appointment to see oncology.  She has seen Dr. Collene Mares as an outpatient.  She apparently had a colonoscopy in the office.  From what she says, Dr. Collene Mares could not see the tumor.  She was admitted last week.  She says she had some bleeding.  She had another CT scan done.  This showed growth of this abdominal mass.  It now measures 6.9 x 7 x 5.5 cm.  It seems to arise from the sigmoid and extend to the pelvic sidewall involving the iliopsoas muscle.  There were some subcentimeter lymph nodes.  There is no obvious disease elsewhere.  She had tumor markers done.  Her CEA was elevated at 33.  The CA125 was elevated at 142.  Her CA 19-9 was elevated at 179.  Interventional radiology did a biopsy.  The pathology report ([id number]) showed a poorly differentiated carcinoma.  This was felt to be of gynecologic etiology.  The pathologist was not any more specific.  I do not know if this is ovarian or if this is uterine.  We are now asked to see her to try to help out.  Surgery has seen her.  They say this tumor is not  resectable.  She has anemia.  Her labs have not been done for 4 days.  Her last the labs back on May 8 showed a white cell count of 10.3.  Hemoglobin 9.9.  Platelet count 442,000.  MCV is 85.  Her BUN is 5 and creatinine 0.6.  Calcium is 9.1 with an albumin of 3.2.  Her liver function tests are okay.  She is not complaining of pain.  She is having no cough or shortness of breath.  Her left leg does look swollen.  She probably needs to have a Doppler done.  Overall, her performance status is ECOG 1-2.   Past Medical History:  Diagnosis Date  . Arthritis   . Asthma    allergy induced  . Bilateral carotid artery disease (HCC)    L carotid bruit  . Bursitis    left hip  . CAD (coronary artery disease)   . Cancer ([place])   . Dyslipidemia    intolerant to statins, welchol, niacin, zetia  . History of blood transfusion   . History of nuclear stress test 04/24/2012   lexiscan; normal study  . Hypertension   . Hypothyroidism   . Postoperative nausea and vomiting 01/02/2016  :  Past Surgical History:  Procedure Laterality Date  . ABDOMINAL HYSTERECTOMY    . Carotid  Doppler  02/2012   40-59% right int carotid artery stenosis; 60-79% L int carotid stenosis; L carotid bruit  . CORONARY ARTERY BYPASS GRAFT  03/12/2004   LIMA to LAD, SVG to circumflex, SVG to PDA  . CYSTOSCOPY WITH STENT PLACEMENT Bilateral 08/21/2017   Procedure: CYSTOSCOPY WITH STENT PLACEMENT;  Surgeon: Ceasar Mons, MD;  Location: [facility];  Service: Urology;  Laterality: Bilateral;  . IR GENERIC HISTORICAL  04/29/2016   IR RADIOLOGIST EVAL & MGMT 04/29/2016 Sandi Mariscal, MD [facility]  . IR GENERIC HISTORICAL  05/12/2016   IR RADIOLOGIST EVAL & MGMT 05/12/2016 Sandi Mariscal, MD [facility]  . ROBOTIC ASSISTED TOTAL HYSTERECTOMY WITH BILATERAL SALPINGO OOPHERECTOMY Bilateral 12/30/2015   Procedure: XI ROBOTIC ASSISTED TOTAL HYSTERECTOMY WITH BILATERAL SALPINGO OOPHORECTOMY WITH SENTAL LYMPH NODE BIOPSY;   Surgeon: Nancy Marus, MD;  Location: [facility];  Service: Gynecology;  Laterality: Bilateral;  . TONSILLECTOMY    . TRANSTHORACIC ECHOCARDIOGRAM  04/2007   EF>55%; mild MR; mild-mod TR; mild pulm HTN; mild calcification of aortiv valve leaflets with mild valvular aortic stenosis  . TUBAL LIGATION    :   Current Facility-Administered Medications:  .  0.9 %  sodium chloride infusion, , Intravenous, Continuous, Norval Morton, MD, Stopped at 10/08/17 1000 .  acetaminophen (TYLENOL) tablet 650 mg, 650 mg, Oral, Q6H PRN **OR** acetaminophen (TYLENOL) suppository 650 mg, 650 mg, Rectal, Q6H PRN, Tamala Julian, Rondell A, MD .  ALPRAZolam Duanne Moron) tablet 0.5 mg, 0.5 mg, Oral, BID PRN, Tamala Julian, Rondell A, MD .  diclofenac sodium (VOLTAREN) 1 % transdermal gel 2 g, 2 g, Topical, QID, Smith, Rondell A, MD, 2 g at 10/09/17 0809 .  diltiazem (CARDIZEM CD) 24 hr capsule 300 mg, 300 mg, Oral, Daily, Tamala Julian, Rondell A, MD, 300 mg at 10/09/17 0806 .  dronabinol (MARINOL) capsule 2.5 mg, 2.5 mg, Oral, BID AC, Velvet Bathe, MD, 2.5 mg at 10/08/17 1805 .  hydrALAZINE (APRESOLINE) injection 10 mg, 10 mg, Intravenous, Q4H PRN, Tamala Julian, Rondell A, MD, 10 mg at 10/05/17 0522 .  HYDROcodone-acetaminophen (NORCO/VICODIN) 5-325 MG per tablet 1 tablet, 1 tablet, Oral, Q6H PRN, Norval Morton, MD, 1 tablet at 10/09/17 0807 .  lactose free nutrition (BOOST PLUS) liquid 237 mL, 237 mL, Oral, BID BM, Jani Gravel, MD, 237 mL at 10/09/17 0810 .  levothyroxine (SYNTHROID, LEVOTHROID) tablet 50 mcg, 50 mcg, Oral, QAC breakfast, Fuller Plan A, MD, 50 mcg at 10/09/17 0717 .  losartan (COZAAR) tablet 100 mg, 100 mg, Oral, Daily, Tamala Julian, Rondell A, MD, 100 mg at 10/09/17 0807 .  morphine 4 MG/ML injection 1 mg, 1 mg, Intravenous, Q4H PRN, Nita Sells, MD, 1 mg at 10/06/17 2026 .  ondansetron (ZOFRAN) tablet 4 mg, 4 mg, Oral, Q6H PRN **OR** ondansetron (ZOFRAN) injection 4 mg, 4 mg, Intravenous, Q6H PRN, Fuller Plan A, MD, 4 mg at  10/05/17 0610 .  pantoprazole (PROTONIX) EC tablet 40 mg, 40 mg, Oral, Daily, Tamala Julian, Rondell A, MD, 40 mg at 10/09/17 0806 .  polyethylene glycol (MIRALAX / GLYCOLAX) packet 17 g, 17 g, Oral, Daily PRN, Tamala Julian, Rondell A, MD, 17 g at 10/06/17 1054 .  traMADol (ULTRAM) tablet 50 mg, 50 mg, Oral, Q12H PRN, Fuller Plan A, MD, 50 mg at 10/09/17 0013:  . diclofenac sodium  2 g Topical QID  . diltiazem  300 mg Oral Daily  . dronabinol  2.5 mg Oral BID AC  . lactose free nutrition  237 mL Oral BID BM  .  levothyroxine  50 mcg Oral QAC breakfast  . losartan  100 mg Oral Daily  . pantoprazole  40 mg Oral Daily  :  Allergies  Allergen Reactions  . Statins Other (See Comments)    Myalgias and memory problems  . Ciprofloxacin Itching    Splotchy redness with itching during IV infusion localized to arm.  :  Family History  Problem Relation Age of Onset  . Hypertension Mother   . Heart disease Mother        Died in her 88s  . Stroke Father   . Kidney disease Brother   . Heart disease Brother        also HTN, hyperlipidemia  . Heart attack Brother   . Stroke Sister        x2  . Hypertension Sister   :  Social History   Socioeconomic History  . Marital status: Widowed    Spouse name: Not on file  . Number of children: 2  . Years of education: Not on file  . Highest education level: Not on file  Occupational History    Employer: RETIRED  Social Needs  . Financial resource strain: Not on file  . Food insecurity:    Worry: Not on file    Inability: Not on file  . Transportation needs:    Medical: Not on file    Non-medical: Not on file  Tobacco Use  . Smoking status: Never Smoker  . Smokeless tobacco: Never Used  Substance and Sexual Activity  . Alcohol use: No  . Drug use: No  . Sexual activity: Not on file  Lifestyle  . Physical activity:    Days per week: Not on file    Minutes per session: Not on file  . Stress: Not on file  Relationships  . Social connections:     Talks on phone: Not on file    Gets together: Not on file    Attends religious service: Not on file    Active member of club or organization: Not on file    Attends meetings of clubs or organizations: Not on file    Relationship status: Not on file  . Intimate partner violence:    Fear of current or ex partner: Not on file    Emotionally abused: Not on file    Physically abused: Not on file    Forced sexual activity: Not on file  Other Topics Concern  . Not on file  Social History Narrative  . Not on file  :  Pertinent items are noted in HPI.  Exam: Patient Vitals for the past 24 hrs:  BP Temp Temp src Pulse Resp SpO2 Weight  10/09/17 0528 (!) 129/53 97.7 F (36.5 C) Oral 65 16 96 % -  10/09/17 0500 - - - - - - 150 lb 12.7 oz (68.4 kg)  10/08/17 2128 (!) 139/50 99.2 F (37.3 C) Oral 82 16 96 % -  10/08/17 1518 (!) 136/51 98.7 F (37.1 C) Oral 71 16 94 % -     No results for input(s): WBC, HGB, HCT, PLT in the last 72 hours. No results for input(s): NA, K, CL, CO2, GLUCOSE, BUN, CREATININE, CALCIUM in the last 72 hours.  Blood smear review: None  Pathology:  None    Assessment and Plan:  Shannon Scott is a 77 year old white female.  She has this poorly differentiated adenocarcinoma in the left pelvis.  It appears to be emanating from the sigmoid colon.  However, the  pathology report suggest a gynecologic primary.  I will have to speak with the pathologist to see if they might be able to be more specific as to whether or not this is uterine or ovarian.  This really will help with respect to treatment recommendations.  I suspect that she we will require a combination of radiation and chemotherapy.  It does not look like this mass is metastasized.  As such, we might be able to shrink it and then have it resected.  I will have to speak with Dr. Collene Mares of gastroenterology.  I will have to see what she says regarding the colonoscopy may be have the report sent to my  office.  Shannon Scott is very very nice.  It looks like this is a tough problem.  However, she still has a decent performance status.  We will follow along.  She does need a Doppler of her left leg.  This might be lymphedema from lymphatic obstruction from this mass.  I have very much appreciated see Shannon Scott.  I spent an hour with her this morning.  She has a very strong faith.  She has me to pray with her.  I definitely did that.  [name], MD  Shannon Scott 66:13

## 2017-10-09 NOTE — Progress Notes (Signed)
Pt IV infiltrated, pt does not want another IV unless medically necessary. MD notified and ok w/ no IV. Will monitor pt w/ no IVF, no IV access.  Kizzie Ide, RN

## 2017-10-09 NOTE — Progress Notes (Signed)
Patient ID: Shannon Scott, female   DOB: 05-31-1941, 77 y.o.   MRN: [id number]                                                                PROGRESS NOTE                                                                                                                                                                                                             Patient Demographics:    Shannon Scott, is a 77 y.o. female, DOB - 01-04-1941, YKD:[id number]  Admit date - 10/03/2017   Admitting Physician Norval Morton, MD  Outpatient Primary MD for the patient is Leeroy Cha, MD  LOS - 2  Outpatient Specialists:     No chief complaint on file.      Brief Narrative   77 y.o. female with medical history significant of CAD s/p CABG, endometrial cancer, A. fib with RVR, and  hypothyroidism; who presents with complaints of generalized weakness and continued rectal bleeding.   She just been recently hospitalized 3/21-3/29, after coming in with complaints of abdominal pain with nausea and vomiting.  Patient was found to have a ill-defined heterogeneous mass involving the sigmoid colon of the pelvis with signs of possible perforation and abscess during the hospitalization patient was evaluated by general surgery,  cardiology, urology, and orthopedic surgery.  She underwent JJ stenting and laparoscopic diverting loop colostomy by Dr. Excell Seltzer on 3/24.  She was initially discharged to a rehab facility, but has been home for a while.  Since being hospitalized patient reports still having rectal bleeding.  She will get the urge to have a bowel movement and when she goes notes clots of dark blood present.  Patient going once or twice per day.  She notes that she is just had generalized weakness and felt as though she may pass out.  Denies having any loss of consciousness or recent falls.  Her biggest complaint is continued left-sided hip pain that runs down her leg and into her groin area.   Just 3 days ago, she had a scope by Dr. Collene Mares of gastroenterology, but the mass was not able to be reached.  Patient notes her weakness symptoms were so severe today to the point which she was unable to  get around and she came in for further evaluation.  Denies having any significant chest pain, shortness of breath, loss of consciousness, fall, fever, chills, nausea, or vomiting.  ED Course: Upon admission into the emergency department the patient was found to be 164/104-196/77, and all other vitals within normal limits. Labs revealed WBC 9.7, hemoglobin 10.4, platelets 472 and other labs relatively within normal limits.  General surgery and gastroenterology were notified and recommended admission for observation overnight with likely need of repeat CT scan and interventional radiology.  [facility] called to admit.   Subjective:    Shannon Scott   No new complaints. Eating improved while on marinol   Assessment  & Plan :    Principal Problem:   Rectal bleeding Active Problems:   Hypothyroid   Essential hypertension   Normocytic anemia   Left hip pain   Generalized weakness  Rectal bleeding, pelvic mass: Patient presents with complaints of weakness and continued rectal bleeding.  CT scan from 3/21, sigmoid colon of the pelvis with signs of possible perforation and abscess Hemoglobin appears to be stable.  Patient found to have left lower lobe status post diverting loop colonoscopy on 3/24.  Recent scope was unable to obtain tissue biopsy.(Dr. Collene Mares)   Radiology was called and recommended repeat CT scan 5/6.=>  IMPRESSION: 1. No acute findings. 2. 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 It extends to involve the left pelvic sidewall, specifically the ileo psoas muscle. There are now inflammatory type changes adjacent to the mass that have increased from prior exam. There are multiple prominent to borderline enlarged lymph nodes several increased in size from the prior  exam. No other evidence of metastatic disease. 3. Loop colostomy lies in transverse colon has been performed since the prior study. No evidence of bowel obstruction. 4. There is left hydronephrosis despite a well-positioned left ureteral stent. No right hydronephrosis. 5. Few small gallstones.  Aortic atherosclerosis. 6. Masslike prominence in the gastric cardia is most likely a prominent fold. This could be from previous hiatal hernia surgery if this has been performed in the past. The appearance of this portion of the stomach is stable from prior CTs dating back to August 2017.  - Consulted interventional radiology and patient is s/p bx. bx results are back reporting suspicion for adenocarcinoma of gynecological origin.  - Consulted oncologist and currently assisting. They want to have pathologist see if they can comment on what type of cancer is suspected for better treatment options moving ahead.  Generalized weakness - Most likely secondary to poor oral intake  Poor oral intake -Still poses risk to deterioration after hospital discharge but is improving with recent Marinol administration.  Hypokalemia -Resolved on last check  Normocytic normochromic anemia: Patient's hemoglobin on admission noted to be 10.4 which appears close to last check on 5/3.  Patient was typed and screened for possible need of blood products. - Check cbc in am  Left hip pain:  - Continue tramadol prn pain  Essential hypertension: Initially uncontrolled blood pressures elevated up to 196/77.  Patient was given home blood pressure medications. - Continue losartan - Hydralazine IV prn SBP>180 or dBP> 110   Paroxysmal atrial fibrillation: CHA2DS2-VASc score = 4.  Patient was not put on anticoagulation due to recent ostomy.  Now patient has rectal bleeding as an additional cause for not being placed on anticoagulant.    - Continue diltiazem   CAD s/p CABG   H/O endometrial cancer  Hypothyroidism -  TSH  pending - Continue levothyroxine  DVT prophylaxis: SCD   Code Status: Full Family Communication: Discussed plan of care with the patient  Disposition Plan: Discharge patient after oncology evaluation. Consults called: Oncology   Lab Results  Component Value Date   PLT 415 (H) 10/09/2017    Antibiotics  :  none  Anti-infectives (From admission, onward)   None        Objective:   Vitals:   10/08/17 1518 10/08/17 2128 10/09/17 0500 10/09/17 0528  BP: (!) 136/51 (!) 139/50  (!) 129/53  Pulse: 71 82  65  Resp: 16 16  16   Temp: 98.7 F (37.1 C) 99.2 F (37.3 C)  97.7 F (36.5 C)  TempSrc: Oral Oral  Oral  SpO2: 94% 96%  96%  Weight:   68.4 kg (150 lb 12.7 oz)   Height:        Wt Readings from Last 3 Encounters:  10/09/17 68.4 kg (150 lb 12.7 oz)  08/29/17 69.4 kg (153 lb)  08/22/17 69.4 kg (153 lb)     Intake/Output Summary (Last 24 hours) at 10/09/2017 1544 Last data filed at 10/09/2017 0833 Gross per 24 hour  Intake 720 ml  Output -  Net 720 ml     Physical Exam, exam unchanged when compared to prior 10/08/2017  Awake and alert, Oriented x 3, in nad. [place].AT,PERRAL Supple Neck,No JVD, No cervical lymphadenopathy appriciated.  Symmetrical Chest wall movement, Good air movement bilaterally, CTAB RRR,No Gallops,Rubs or new Murmurs, No Parasternal Heave +ve B.Sounds, Abd Soft, No tenderness, No organomegaly appriciated, No rebound - guarding or rigidity. No Cyanosis, Clubbing or edema, No new Rash or bruise   colostomy    Data Review:    CBC Recent Labs  Lab 10/03/17 1236 10/04/17 0616 10/05/17 0625 10/09/17 1039  WBC 9.7 8.7 10.3 10.9*  HGB 10.4* 9.1* 9.9* 8.4*  HCT 32.2* 28.4* 29.6* 26.2*  PLT 472* 421* 442* 415*  MCV 85.0 85.5 85.3 84.5  MCH 27.4 27.4 28.5 27.1  MCHC 32.3 32.0 33.4 32.1  RDW 16.1* 16.2* 16.2* 16.2*  LYMPHSABS  --   --   --  1.1  MONOABS  --   --   --  0.4  EOSABS  --   --   --  0.2  BASOSABS  --   --   --  0.0     Chemistries  Recent Labs  Lab 10/03/17 1236 10/04/17 0616 10/05/17 0625 10/05/17 2355 10/09/17 1039  NA 138 140 138 135 139  K 3.6 3.2* 3.3* 4.1 3.8  CL 102 106 104 105 105  CO2 26 25 24 23 24   GLUCOSE 118* 94 105* 132* 114*  BUN 8 6 5* 7 6  CREATININE 0.73 0.61 0.59 0.63 0.74  CALCIUM 9.7 9.0 9.1 8.7* 9.2  MG  --  1.7  --   --   --   AST 13*  --  13*  --  18  ALT 8*  --  8*  --  7*  ALKPHOS 79  --  67  --  66  BILITOT 0.5  --  0.4  --  0.5   ------------------------------------------------------------------------------------------------------------------ No results for input(s): CHOL, HDL, LDLCALC, TRIG, CHOLHDL, LDLDIRECT in the last 72 hours.  No results found for: HGBA1C ------------------------------------------------------------------------------------------------------------------ No results for input(s): TSH, T4TOTAL, T3FREE, THYROIDAB in the last 72 hours.  Invalid input(s): FREET3 ------------------------------------------------------------------------------------------------------------------ No results for input(s): VITAMINB12, FOLATE, FERRITIN, TIBC, IRON, RETICCTPCT in the last 72 hours.  Coagulation profile  Recent Labs  Lab 10/05/17 0625  INR 1.06    No results for input(s): DDIMER in the last 72 hours.  Cardiac Enzymes No results for input(s): CKMB, TROPONINI, MYOGLOBIN in the last 168 hours.  Invalid input(s): CK ------------------------------------------------------------------------------------------------------------------ No results found for: BNP  Inpatient Medications  Scheduled Meds: . diclofenac sodium  2 g Topical QID  . diltiazem  300 mg Oral Daily  . dronabinol  2.5 mg Oral BID AC  . heparin  5,000 Units Subcutaneous Q8H  . lactose free nutrition  237 mL Oral BID BM  . levothyroxine  50 mcg Oral QAC breakfast  . losartan  100 mg Oral Daily  . pantoprazole  40 mg Oral BID   Continuous Infusions: . sodium chloride Stopped  (10/08/17 1000)   PRN Meds:.acetaminophen **OR** acetaminophen, ALPRAZolam, hydrALAZINE, HYDROcodone-acetaminophen, morphine injection, ondansetron **OR** ondansetron (ZOFRAN) IV, polyethylene glycol, traMADol  Micro Results No results found for this or any previous visit (from the past 240 hour(s)).  Radiology Reports Ct Abdomen Pelvis W Contrast  Result Date: 10/03/2017 CLINICAL DATA:  Rectal bleeding.  Malaise. HPIPt had surgery 1 month ago associated with a pelvic /colonic mass. Pt had a diverting colostomy. She did not have the mass removed. Pt states since then she continues to have pain in the pelvic region . She also is having blood from her rectum ever since the surgery but it seems to be getting worse and the bleeding is more severe. She has been seeing her surgeons and She had a flexible sigmoidoscopy on Friday but they did not determine the cause. Pt is concerned because it is taking long to figure out what is going on. They still dont have a tissue diagnosis. Pt called the surgeon today and was told to come to the ED. Pt feels like she is getting worse. PT did not take any of her medications today. EXAM: CT ABDOMEN AND PELVIS WITH CONTRAST TECHNIQUE: Multidetector CT imaging of the abdomen and pelvis was performed using the standard protocol following bolus administration of intravenous contrast. CONTRAST:  130m ISOVUE-300 IOPAMIDOL (ISOVUE-300) INJECTION 61% COMPARISON:  08/18/2017 FINDINGS: Lower chest: 4 mm pleural-based nodular opacity in the lateral left lower lobe, image 3, series 4, above the field of view from the most recent prior study. Present and similar in size on a CT dated 01/14/2016 consistent with a benign nodule. No acute findings. Heart normal in size. Hepatobiliary: No liver masses. Focal fat noted adjacent to the falciform ligament. No other lesions. Liver normal in size. A few small dependent densities are noted in the gallbladder consistent with stones. No wall  thickening or inflammation. No bile duct dilation. Pancreas: Unremarkable. No pancreatic ductal dilatation or surrounding inflammatory changes. Spleen: Normal in size without focal abnormality. Adrenals/Urinary Tract: No adrenal masses. There is mild-to-moderate left renal collecting system dilation. A stent extends from the left renal pelvis through the left ureter to the bladder, well positioned. No right hydronephrosis. Small, 11 mm, lower pole renal cyst. No other renal masses. No intrarenal stones. Right ureters normal in course and in caliber. Bladder is deviated by the large pelvic mass, but otherwise unremarkable. Stomach/Bowel: A large heterogeneous mass arises from the sigmoid colon and extends to the pelvic sidewall involving the ileo psoas muscle. Mass measures approximately 6.9 cm from superior to inferior by 7 cm x 5.5 cm transversely. This has increased in size from the prior exam. No other colonic masses. There are adjacent inflammatory changes and several prominent, subcentimeter lymph nodes. Multiple  diverticula are noted along the colon, stable. A diverting loop colostomy has been performed in utilizing the mid transverse colon. Stomach shows a masslike area arising from the cardia extending to the gastroesophageal junction similar to the prior exam. This may be from previous hiatal hernia surgery or reflect a prominent fold. It is felt unlikely reflect neoplastic disease since it is stable when compared to 01/14/2016 CT. Small bowel is unremarkable. Vascular/Lymphatic: There are few prominent to mildly enlarged pelvic and retroperitoneal lymph nodes. Largest measuring 1 cm lying to the left of the aortic bifurcation. This node is increased in size from the prior CT where it measured 6 mm. Reproductive: Uterus is surgically absent. Other: Hazy inflammatory change is noted in the pelvis that has increased from the prior exam. No ascites. Musculoskeletal: No fracture or acute finding. No  osteoblastic or osteolytic lesions. IMPRESSION: 1. No acute findings. 2. 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 It extends to involve the left pelvic sidewall, specifically the ileo psoas muscle. There are now inflammatory type changes adjacent to the mass that have increased from prior exam. There are multiple prominent to borderline enlarged lymph nodes several increased in size from the prior exam. No other evidence of metastatic disease. 3. Loop colostomy lies in transverse colon has been performed since the prior study. No evidence of bowel obstruction. 4. There is left hydronephrosis despite a well-positioned left ureteral stent. No right hydronephrosis. 5. Few small gallstones.  Aortic atherosclerosis. 6. Masslike prominence in the gastric cardia is most likely a prominent fold. This could be from previous hiatal hernia surgery if this has been performed in the past. The appearance of this portion of the stomach is stable from prior CTs dating back to August 2017. Electronically Signed   By: Lajean Manes M.D.   On: 10/03/2017 21:33   Ct Biopsy  Result Date: 10/05/2017 INDICATION: 77 year old female with a history of locally invasive exophytic sigmoid colonic mass highly concerning for adenocarcinoma. She has not undergone prior biopsy and presents for CT-guided biopsy to obtain tissue diagnosis. EXAM: CT guided tissue biopsy of left pelvic mass MEDICATIONS: None. ANESTHESIA/SEDATION: Moderate (conscious) sedation was employed during this procedure. A total of Versed 2 mg and Fentanyl 100 mcg was administered intravenously. Moderate Sedation Time: 12 minutes. The patient's level of consciousness and vital signs were monitored continuously by radiology nursing throughout the procedure under my direct supervision. FLUOROSCOPY TIME:  Fluoroscopy Time: 0 minutes 0 seconds (0 mGy). COMPLICATIONS: None immediate. PROCEDURE: Informed written consent was obtained from the patient after a  thorough discussion of the procedural risks, benefits and alternatives. All questions were addressed. A timeout was performed prior to the initiation of the procedure. A planning axial CT scan was performed. The exophytic mass was identified in the left lower quadrant. A suitable skin entry site was selected medial to the inferior epigastric vessels. The overlying skin was prepped and marked. Under intermittent CT guidance, a 17 gauge introducer needle was advanced into the margin of the mass. Multiple 18 gauge core biopsies were then coaxially obtained and placed in formalin before being delivered to pathology for further analysis. The introducer needle was removed. Post biopsy axial CT imaging demonstrates no evidence of immediate complication. Patient tolerated the procedure well. IMPRESSION: Technically successful CT-guided biopsy of left pelvic mass. Electronically Signed   By: Jacqulynn Cadet M.D.   On: 10/05/2017 09:21    Time Spent in minutes  30 min   Velvet Bathe M.D on 10/09/2017  at 3:44 PM  Between 7am to 7pm - Pager - [phone]  After 7pm go to [email or web address] - password [facility]  [facility] -  Office  [phone]

## 2017-10-10 ENCOUNTER — Inpatient Hospital Stay (HOSPITAL_COMMUNITY): Payer: Medicare Other

## 2017-10-10 ENCOUNTER — Ambulatory Visit
Admit: 2017-10-10 | Discharge: 2017-10-10 | Disposition: A | Discharge status: Home / Self Care | Payer: Medicare Other | Attending: Radiation Oncology | Admitting: Radiation Oncology

## 2017-10-10 DIAGNOSIS — C541 Malignant neoplasm of endometrium: Secondary | ICD-10-CM

## 2017-10-10 DIAGNOSIS — R600 Localized edema: Secondary | ICD-10-CM

## 2017-10-10 DIAGNOSIS — D509 Iron deficiency anemia, unspecified: Secondary | ICD-10-CM

## 2017-10-10 LAB — COMPREHENSIVE METABOLIC PANEL
ALT: 9 U/L — ABNORMAL LOW (ref 14–54)
AST: 11 U/L — ABNORMAL LOW (ref 15–41)
Albumin: 2.6 g/dL — ABNORMAL LOW (ref 3.5–5.0)
Alkaline Phosphatase: 61 U/L (ref 38–126)
Anion gap: 10 (ref 5–15)
BUN: 6 mg/dL (ref 6–20)
CO2: 25 mmol/L (ref 22–32)
Calcium: 9.1 mg/dL (ref 8.9–10.3)
Chloride: 104 mmol/L (ref 101–111)
Creatinine, Ser: 0.68 mg/dL (ref 0.44–1.00)
GFR calc Af Amer: >60 mL/min (ref >60)
GFR calc non Af Amer: >60 mL/min (ref >60)
Glucose, Bld: 96 mg/dL (ref 65–99)
Potassium: 3.7 mmol/L (ref 3.5–5.1)
Sodium: 139 mmol/L (ref 135–145)
Total Bilirubin: 0.3 mg/dL (ref 0.3–1.2)
Total Protein: 6.7 g/dL (ref 6.5–8.1)

## 2017-10-10 LAB — CBC WITH DIFFERENTIAL/PLATELET
Basophils Absolute: 0 10*3/uL (ref 0.0–0.1)
Basophils Absolute: 0 10*3/uL (ref 0.0–0.1)
Basophils Relative: 0 %
Basophils Relative: 0 %
Eosinophils Absolute: 0.2 10*3/uL (ref 0.0–0.7)
Eosinophils Absolute: 0.2 10*3/uL (ref 0.0–0.7)
Eosinophils Relative: 2 %
Eosinophils Relative: 2 %
HCT: 26.2 % — ABNORMAL LOW (ref 36.0–46.0)
HCT: 25.3 % — ABNORMAL LOW (ref 36.0–46.0)
Hemoglobin: 8.4 g/dL — ABNORMAL LOW (ref 12.0–15.0)
Hemoglobin: 8 g/dL — ABNORMAL LOW (ref 12.0–15.0)
Lymphocytes Relative: 10 %
Lymphocytes Relative: 13 %
Lymphs Abs: 1.1 10*3/uL (ref 0.7–4.0)
Lymphs Abs: 1.1 10*3/uL (ref 0.7–4.0)
MCH: 27.1 pg (ref 26.0–34.0)
MCH: 26.8 pg (ref 26.0–34.0)
MCHC: 32.1 g/dL (ref 30.0–36.0)
MCHC: 31.6 g/dL (ref 30.0–36.0)
MCV: 84.5 fL (ref 78.0–100.0)
MCV: 84.9 fL (ref 78.0–100.0)
Monocytes Absolute: 0.4 10*3/uL (ref 0.1–1.0)
Monocytes Absolute: 0.5 10*3/uL (ref 0.1–1.0)
Monocytes Relative: 4 %
Monocytes Relative: 6 %
Neutro Abs: 9.2 10*3/uL — ABNORMAL HIGH (ref 1.7–7.7)
Neutro Abs: 6.6 10*3/uL (ref 1.7–7.7)
Neutrophils Relative %: 84 %
Neutrophils Relative %: 79 %
Platelets: 415 10*3/uL — ABNORMAL HIGH (ref 150–400)
Platelets: 405 10*3/uL — ABNORMAL HIGH (ref 150–400)
RBC: 3.1 MIL/uL — ABNORMAL LOW (ref 3.87–5.11)
RBC: 2.98 MIL/uL — ABNORMAL LOW (ref 3.87–5.11)
RDW: 16.2 % — ABNORMAL HIGH (ref 11.5–15.5)
RDW: 16.1 % — ABNORMAL HIGH (ref 11.5–15.5)
WBC: 10.9 10*3/uL — ABNORMAL HIGH (ref 4.0–10.5)
WBC: 8.4 10*3/uL (ref 4.0–10.5)

## 2017-10-10 LAB — PREPARE RBC (CROSSMATCH)

## 2017-10-10 MED ORDER — ACETAMINOPHEN 160 MG/5ML PO SOLN
500.0000 mg | Freq: Four times a day (QID) | ORAL | Status: DC | PRN
  Administered 2017-10-11 – 2017-10-13 (×3): 500 mg via ORAL
  Filled 2017-10-10 (×3): qty 20.3

## 2017-10-10 MED ORDER — SODIUM CHLORIDE 0.9 % IV SOLN
Freq: Once | INTRAVENOUS | Status: AC
  Administered 2017-10-10: 09:00:00 via INTRAVENOUS

## 2017-10-10 MED ORDER — SODIUM CHLORIDE 0.9 % IV SOLN
510.0000 mg | Freq: Once | INTRAVENOUS | Status: AC
  Administered 2017-10-10: 510 mg via INTRAVENOUS
  Filled 2017-10-10: qty 17

## 2017-10-10 MED ORDER — NON FORMULARY: 500.0000 mg | Status: DC | PRN

## 2017-10-10 MED ORDER — TRAMADOL HCL 50 MG PO TABS
50.0000 mg | ORAL_TABLET | Freq: Four times a day (QID) | ORAL | Status: DC | PRN
  Administered 2017-10-10 – 2017-10-16 (×14): 50 mg via ORAL
  Filled 2017-10-10 (×15): qty 1

## 2017-10-10 MED ORDER — FUROSEMIDE 10 MG/ML IJ SOLN
20.0000 mg | Freq: Once | INTRAMUSCULAR | Status: AC
  Administered 2017-10-10: 20 mg via INTRAVENOUS
  Filled 2017-10-10: qty 2

## 2017-10-10 NOTE — Progress Notes (Signed)
Radiation Oncology         [phone] ________________________________  Initial Inpatient Consultation  Name: Shannon Scott MRN: [id number]  Date: 10/10/2017  DOB: 11/03/1940  BP:[id number], Ronie Spies, MD  Leeroy Cha,*   REFERRING PHYSICIAN: Leeroy Cha,*  DIAGNOSIS: 77 y.o. female with Left pelvic mass resulting in bowel obstruction and ureteral obstruction, poorly differentiated carcinoma consistent with gynecologic primary  HISTORY OF PRESENT ILLNESS::Shannon Scott is a 77 y.o. female with a prior history of stage [id number] endometrial cancer status post total hysterectomy with bilateral salpingo oophorectomy done by Dr. Alycia Rossetti in August 2017. Final pathology revealed endometrial adenocarcinoma, measuring 1.7 cm, with superficial myometrial invasion. All margins and lymph nodes were negative, as well as the cervix, bilateral ovaries, and bilateral fallopian tubes. Adjuvant radiation therapy was not recommended at the time in light of her early stage disease according to NCCN guidelines. She had done well until recently in March 2019 she presented with generalized abdominal pain and vomiting. CTA abdomen/pelvis from 08/18/17 showed a 5.7 x 5.3 cm mass in the left hemipelvis involving the sigmoid colon and left pelvic soft tissues resulting in colon obstruction and left ureteral obstruction. She underwent a diverting loop colostomy, and no biopsies were taken. According to the operative report, the tumor could not be seen.   She presented to the ED again on 10/03/17 with worsening rectal bleeding. She had undergone a flexible sigmoidoscopy a few days earlier, but Dr. Collene Mares did not determine the cause and was unable to obtain a tissue biopsy. She was re-evaluated with a CT abdomen/pelvis with contrast which showed the exophytic sigmoid colon carcinoma had increased in size to 6.9 x 7 x 5.5 cm. It extends to involve the left pelvic sidewall, specifically the ileo psoas  muscle. There are also multiple prominent to borderline enlarged lymph nodes, several increased in size from prior exam. No other evidence of metastatic disease. CT-guided biopsy of the mass obtained on 10/05/17 revealed poorly differentiated carcinoma consistent with gynecologic primary.  She has been seen in surgical consultation who have determined her tumor to be not resectable at this time. She has been referred for discussion of potential radiation treatment options.   PREVIOUS RADIATION THERAPY: No  PAST MEDICAL HISTORY:  has a past medical history of Arthritis, Asthma, Bilateral carotid artery disease ([place]), Bursitis, CAD (coronary artery disease), Cancer ([place]), Dyslipidemia, History of blood transfusion, History of nuclear stress test (04/24/2012), Hypertension, Hypothyroidism, and Postoperative nausea and vomiting (01/02/2016).    PAST SURGICAL HISTORY: Past Surgical History:  Procedure Laterality Date  . ABDOMINAL HYSTERECTOMY    . Carotid Doppler  02/2012   40-59% right int carotid artery stenosis; 60-79% L int carotid stenosis; L carotid bruit  . CORONARY ARTERY BYPASS GRAFT  03/12/2004   LIMA to LAD, SVG to circumflex, SVG to PDA  . CYSTOSCOPY WITH STENT PLACEMENT Bilateral 08/21/2017   Procedure: CYSTOSCOPY WITH STENT PLACEMENT;  Surgeon: Ceasar Mons, MD;  Location: [facility];  Service: Urology;  Laterality: Bilateral;  . IR GENERIC HISTORICAL  04/29/2016   IR RADIOLOGIST EVAL & MGMT 04/29/2016 Sandi Mariscal, MD [facility]  . IR GENERIC HISTORICAL  05/12/2016   IR RADIOLOGIST EVAL & MGMT 05/12/2016 Sandi Mariscal, MD [facility]  . ROBOTIC ASSISTED TOTAL HYSTERECTOMY WITH BILATERAL SALPINGO OOPHERECTOMY Bilateral 12/30/2015   Procedure: XI ROBOTIC ASSISTED TOTAL HYSTERECTOMY WITH BILATERAL SALPINGO OOPHORECTOMY WITH SENTAL LYMPH NODE BIOPSY;  Surgeon: Nancy Marus, MD;  Location: [facility];  Service: Gynecology;  Laterality:  Bilateral;  . TONSILLECTOMY    .  TRANSTHORACIC ECHOCARDIOGRAM  04/2007   EF>55%; mild MR; mild-mod TR; mild pulm HTN; mild calcification of aortiv valve leaflets with mild valvular aortic stenosis  . TUBAL LIGATION      FAMILY HISTORY: family history includes Heart attack in her brother; Heart disease in her brother and mother; Hypertension in her mother and sister; Kidney disease in her brother; Stroke in her father and sister.  SOCIAL HISTORY:  reports that she has never smoked. She has never used smokeless tobacco. She reports that she does not drink alcohol or use drugs.  ALLERGIES: Statins and Ciprofloxacin  MEDICATIONS:  No current facility-administered medications for this encounter.    No current outpatient medications on file.   Facility-Administered Medications Ordered in Other Encounters  Medication Dose Route Frequency Provider Last Rate Last Dose  . 0.9 %  sodium chloride infusion   Intravenous Continuous Norval Morton, MD   Stopped at 10/08/17 1000  . acetaminophen (TYLENOL) tablet 650 mg  650 mg Oral Q6H PRN Norval Morton, MD       Or  . acetaminophen (TYLENOL) suppository 650 mg  650 mg Rectal Q6H PRN Fuller Plan A, MD      . ALPRAZolam Duanne Moron) tablet 0.5 mg  0.5 mg Oral BID PRN Fuller Plan A, MD      . diclofenac sodium (VOLTAREN) 1 % transdermal gel 2 g  2 g Topical QID Fuller Plan A, MD   2 g at 10/10/17 1712  . diltiazem (CARDIZEM CD) 24 hr capsule 300 mg  300 mg Oral Daily Smith, Rondell A, MD   300 mg at 10/10/17 1021  . dronabinol (MARINOL) capsule 2.5 mg  2.5 mg Oral BID AC Vega, Linward Foster, MD   2.5 mg at 10/10/17 1329  . hydrALAZINE (APRESOLINE) injection 10 mg  10 mg Intravenous Q4H PRN Fuller Plan A, MD   10 mg at 10/05/17 0522  . lactose free nutrition (BOOST PLUS) liquid 237 mL  237 mL Oral BID BM Jani Gravel, MD   237 mL at 10/10/17 1400  . levothyroxine (SYNTHROID, LEVOTHROID) tablet 50 mcg  50 mcg Oral QAC breakfast Fuller Plan A, MD   50 mcg at 10/10/17 0726  . losartan  (COZAAR) tablet 100 mg  100 mg Oral Daily Smith, Rondell A, MD   100 mg at 10/10/17 1021  . morphine 4 MG/ML injection 1 mg  1 mg Intravenous Q4H PRN Nita Sells, MD   1 mg at 10/06/17 2026  . ondansetron (ZOFRAN) tablet 4 mg  4 mg Oral Q6H PRN Fuller Plan A, MD       Or  . ondansetron (ZOFRAN) injection 4 mg  4 mg Intravenous Q6H PRN Fuller Plan A, MD   4 mg at 10/05/17 0610  . pantoprazole (PROTONIX) EC tablet 40 mg  40 mg Oral BID Volanda Napoleon, MD   40 mg at 10/10/17 1021  . polyethylene glycol (MIRALAX / GLYCOLAX) packet 17 g  17 g Oral Daily PRN Fuller Plan A, MD   17 g at 10/09/17 1455  . traMADol (ULTRAM) tablet 50 mg  50 mg Oral Q6H PRN Velvet Bathe, MD        REVIEW OF SYSTEMS:  REVIEW OF SYSTEMS: A 10+ POINT REVIEW OF SYSTEMS WAS OBTAINED including neurology, dermatology, psychiatry, cardiac, respiratory, lymph, extremities, GI, GU, musculoskeletal, constitutional, reproductive, HEENT. All pertinent positives are noted in the HPI. All others are negative.  sHe reports left  sided pelvic pain and bleeding as above.   PHYSICAL EXAM:  Vitals - 1 value per visit 2/70/3500  SYSTOLIC 938  DIASTOLIC 59  Pulse 83  Temperature 98.8  Respirations 18  Weight (lb) 148.37  Height   BMI   VISIT REPORT    General: Alert and oriented, in no acute distress, lying in hospital bed, accompanied by son and daughter this evening HEENT: Head is normocephalic. Extraocular movements are intact. Oropharynx is clear. Neck: Neck is supple, no palpable cervical or supraclavicular lymphadenopathy. Heart: Regular in rate and rhythm with significant systolic murmur Chest: Clear to auscultation bilaterally, with no rhonchi, wheezes, or rales. Abdomen: Soft, nontender, nondistended, with no rigidity or guarding. Colostomy in place in the upper abdomen which is functioning well Extremities: No cyanosis or edema. Lymphatics: see Neck Exam Skin: No concerning lesions. Musculoskeletal:  symmetric strength and muscle tone throughout. Neurologic: Cranial nerves II through XII are grossly intact. No obvious focalities. Speech is fluent. Coordination is intact. Psychiatric: Judgment and insight are intact. Affect is appropriate.   ECOG = 2  0 - Asymptomatic (Fully active, able to carry on all predisease activities without restriction)  1 - Symptomatic but completely ambulatory (Restricted in physically strenuous activity but ambulatory and able to carry out work of a light or sedentary nature. For example, light housework, office work)  2 - Symptomatic, <50% in bed during the day (Ambulatory and capable of all self care but unable to carry out any work activities. Up and about more than 50% of waking hours)  3 - Symptomatic, >50% in bed, but not bedbound (Capable of only limited self-care, confined to bed or chair 50% or more of waking hours)  4 - Bedbound (Completely disabled. Cannot carry on any self-care. Totally confined to bed or chair)  5 - Death   Eustace Pen MM, Creech RH, Tormey DC, et al. [phone]). "Toxicity and response criteria of the [facility]". [place] Oncol. 5 (6): 649-55  LABORATORY DATA:  Lab Results  Component Value Date   WBC 8.4 10/10/2017   HGB 8.0 (L) 10/10/2017   HCT 25.3 (L) 10/10/2017   MCV 84.9 10/10/2017   PLT 405 (H) 10/10/2017   NEUTROABS 6.6 10/10/2017   Lab Results  Component Value Date   NA 139 10/10/2017   K 3.7 10/10/2017   CL 104 10/10/2017   CO2 25 10/10/2017   GLUCOSE 96 10/10/2017   CREATININE 0.68 10/10/2017   CALCIUM 9.1 10/10/2017      RADIOGRAPHY: Ct Abdomen Pelvis W Contrast  Result Date: 10/03/2017 CLINICAL DATA:  Rectal bleeding.  Malaise. HPIPt had surgery 1 month ago associated with a pelvic /colonic mass. Pt had a diverting colostomy. She did not have the mass removed. Pt states since then she continues to have pain in the pelvic region . She also is having blood from her rectum ever since  the surgery but it seems to be getting worse and the bleeding is more severe. She has been seeing her surgeons and She had a flexible sigmoidoscopy on Friday but they did not determine the cause. Pt is concerned because it is taking long to figure out what is going on. They still dont have a tissue diagnosis. Pt called the surgeon today and was told to come to the ED. Pt feels like she is getting worse. PT did not take any of her medications today. EXAM: CT ABDOMEN AND PELVIS WITH CONTRAST TECHNIQUE: Multidetector CT imaging of the abdomen and pelvis  was performed using the standard protocol following bolus administration of intravenous contrast. CONTRAST:  169mL ISOVUE-300 IOPAMIDOL (ISOVUE-300) INJECTION 61% COMPARISON:  08/18/2017 FINDINGS: Lower chest: 4 mm pleural-based nodular opacity in the lateral left lower lobe, image 3, series 4, above the field of view from the most recent prior study. Present and similar in size on a CT dated 01/14/2016 consistent with a benign nodule. No acute findings. Heart normal in size. Hepatobiliary: No liver masses. Focal fat noted adjacent to the falciform ligament. No other lesions. Liver normal in size. A few small dependent densities are noted in the gallbladder consistent with stones. No wall thickening or inflammation. No bile duct dilation. Pancreas: Unremarkable. No pancreatic ductal dilatation or surrounding inflammatory changes. Spleen: Normal in size without focal abnormality. Adrenals/Urinary Tract: No adrenal masses. There is mild-to-moderate left renal collecting system dilation. A stent extends from the left renal pelvis through the left ureter to the bladder, well positioned. No right hydronephrosis. Small, 11 mm, lower pole renal cyst. No other renal masses. No intrarenal stones. Right ureters normal in course and in caliber. Bladder is deviated by the large pelvic mass, but otherwise unremarkable. Stomach/Bowel: A large heterogeneous mass arises from the  sigmoid colon and extends to the pelvic sidewall involving the ileo psoas muscle. Mass measures approximately 6.9 cm from superior to inferior by 7 cm x 5.5 cm transversely. This has increased in size from the prior exam. No other colonic masses. There are adjacent inflammatory changes and several prominent, subcentimeter lymph nodes. Multiple diverticula are noted along the colon, stable. A diverting loop colostomy has been performed in utilizing the mid transverse colon. Stomach shows a masslike area arising from the cardia extending to the gastroesophageal junction similar to the prior exam. This may be from previous hiatal hernia surgery or reflect a prominent fold. It is felt unlikely reflect neoplastic disease since it is stable when compared to 01/14/2016 CT. Small bowel is unremarkable. Vascular/Lymphatic: There are few prominent to mildly enlarged pelvic and retroperitoneal lymph nodes. Largest measuring 1 cm lying to the left of the aortic bifurcation. This node is increased in size from the prior CT where it measured 6 mm. Reproductive: Uterus is surgically absent. Other: Hazy inflammatory change is noted in the pelvis that has increased from the prior exam. No ascites. Musculoskeletal: No fracture or acute finding. No osteoblastic or osteolytic lesions. IMPRESSION: 1. No acute findings. 2. 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 It extends to involve the left pelvic sidewall, specifically the ileo psoas muscle. There are now inflammatory type changes adjacent to the mass that have increased from prior exam. There are multiple prominent to borderline enlarged lymph nodes several increased in size from the prior exam. No other evidence of metastatic disease. 3. Loop colostomy lies in transverse colon has been performed since the prior study. No evidence of bowel obstruction. 4. There is left hydronephrosis despite a well-positioned left ureteral stent. No right hydronephrosis. 5. Few  small gallstones.  Aortic atherosclerosis. 6. Masslike prominence in the gastric cardia is most likely a prominent fold. This could be from previous hiatal hernia surgery if this has been performed in the past. The appearance of this portion of the stomach is stable from prior CTs dating back to August 2017. Electronically Signed   By: Lajean Manes M.D.   On: 10/03/2017 21:33   Ct Biopsy  Result Date: 10/05/2017 INDICATION: 77 year old female with a history of locally invasive exophytic sigmoid colonic mass highly concerning for  adenocarcinoma. She has not undergone prior biopsy and presents for CT-guided biopsy to obtain tissue diagnosis. EXAM: CT guided tissue biopsy of left pelvic mass MEDICATIONS: None. ANESTHESIA/SEDATION: Moderate (conscious) sedation was employed during this procedure. A total of Versed 2 mg and Fentanyl 100 mcg was administered intravenously. Moderate Sedation Time: 12 minutes. The patient's level of consciousness and vital signs were monitored continuously by radiology nursing throughout the procedure under my direct supervision. FLUOROSCOPY TIME:  Fluoroscopy Time: 0 minutes 0 seconds (0 mGy). COMPLICATIONS: None immediate. PROCEDURE: Informed written consent was obtained from the patient after a thorough discussion of the procedural risks, benefits and alternatives. All questions were addressed. A timeout was performed prior to the initiation of the procedure. A planning axial CT scan was performed. The exophytic mass was identified in the left lower quadrant. A suitable skin entry site was selected medial to the inferior epigastric vessels. The overlying skin was prepped and marked. Under intermittent CT guidance, a 17 gauge introducer needle was advanced into the margin of the mass. Multiple 18 gauge core biopsies were then coaxially obtained and placed in formalin before being delivered to pathology for further analysis. The introducer needle was removed. Post biopsy axial CT  imaging demonstrates no evidence of immediate complication. Patient tolerated the procedure well. IMPRESSION: Technically successful CT-guided biopsy of left pelvic mass. Electronically Signed   By: Jacqulynn Cadet M.D.   On: 10/05/2017 09:21      IMPRESSION: 77 y.o. female with Left pelvic mass resulting in bowel obstruction and ureteral obstruction, poorly differentiated carcinoma consistent with gynecologic primary.  Patient is symptomatic from her tumor mass with bleeding and pain. She would be a good candidate for an aggressive course of palliative radiation therapy directed at the left-sided pelvic mass. I would anticipate approximately 5 to  5 and half weeks of radiation therapy possibly with radiosensitizing chemotherapy depending on Dr. Antonieta Pert recommendation.  PLAN:patient will be brought out down to radiation oncology tomorrow for planning and treatment with radiation therapy to tentatively start the May 15. Also plan on presenting the patient's case at the gynecologic oncology conference May 20 for further input concerning management.     ------------------------------------------------  Blair Promise, PhD, MD  This document serves as a record of services personally performed by Gery Pray, MD. It was created on his behalf by Rae Lips, a trained medical scribe. The creation of this record is based on the scribe's personal observations and the provider's statements to them. This document has been checked and approved by the attending provider.

## 2017-10-10 NOTE — Progress Notes (Signed)
Shannon Scott has hemoglobin of 8 this morning.  She is clearly iron deficient.  She says that her stool is black.  As such, I suspect that she probably is bleeding.  I do not know she needs to be seen by gastroenterology to see if they can look up the colon to find out where she could be bleeding from.  I will give her 2 units of blood.  I will give her IV iron.  I talked to her about the blood transfusion.  Her daughter was with this this morning.  I explained the rationale behind the blood transfusion.  I explained why I thought it would help her out.  I think the risk of complications would be less than 5%.  She agrees to the transfusion.  I explained to her daughter what I thought was going on.  We do not know definitively that this is uterine cancer.  I will have to talk to pathology about this.  If they cannot tell me what kind of "gynecologic cancer" that this is, then she will need another biopsy.  It is incredibly important to find out what kind of gynecologic cancer this is as treatment recommendations are based on the histology.  Since she will need radiation therapy in my opinion, I will speak with radiation oncology regarding seeing her.  Again, we really need to see what the pathology is to know what kind of chemotherapy to utilize.  Her appetite is okay.  She has had no nausea or vomiting.  She still has some abdominal discomfort.  She apparently has a tough time with Vicodin.  She has had no cough.  She has had no shortness of breath.  She is still somewhat febrile.  With her labs, her white cell count is 8.4.  Platelet count 405,000.  Her creatinine is 0.68.  Calcium 9.1 with an albumin of 2.6.  Potassium is 3.7.  I do believe that a transfusion will help her.  I believe IV iron will help her.  She is supposed to have a Doppler done of her left leg.  If she has a thrombus, I think she will need to have a filter placed as I am not sure anticoagulation would be indicated if she is  having GI bleeding.  We will keep follow her along.   [name], MD  [facility] [phone]

## 2017-10-10 NOTE — Progress Notes (Addendum)
Patient ID: Shannon Scott, female   DOB: 10-08-1940, 77 y.o.   MRN: [id number]                                                                PROGRESS NOTE                                                                                                                                                                                                             Patient Demographics:    Shannon Scott, is a 77 y.o. female, DOB - 1941-04-18, GUR:[id number]  Admit date - 10/03/2017   Admitting Physician Norval Morton, MD  Outpatient Primary MD for the patient is Leeroy Cha, MD  LOS - 3  Outpatient Specialists:     No chief complaint on file.      Brief Narrative   77 y.o. female with medical history significant of CAD s/p CABG, endometrial cancer, A. fib with RVR, and  hypothyroidism; who presents with complaints of generalized weakness and continued rectal bleeding.   She just been recently hospitalized 3/21-3/29, after coming in with complaints of abdominal pain with nausea and vomiting.  Patient was found to have a ill-defined heterogeneous mass involving the sigmoid colon of the pelvis with signs of possible perforation and abscess during the hospitalization patient was evaluated by general surgery,  cardiology, urology, and orthopedic surgery.  She underwent JJ stenting and laparoscopic diverting loop colostomy by Dr. Excell Seltzer on 3/24.  She was initially discharged to a rehab facility, but has been home for a while.  Since being hospitalized patient reports still having rectal bleeding.  She will get the urge to have a bowel movement and when she goes notes clots of dark blood present.  Patient going once or twice per day.  She notes that she is just had generalized weakness and felt as though she may pass out.  Denies having any loss of consciousness or recent falls.  Her biggest complaint is continued left-sided hip pain that runs down her leg and into her groin area.   Just 3 days ago, she had a scope by Dr. Collene Mares of gastroenterology, but the mass was not able to be reached.  Patient notes her weakness symptoms were so severe today to the point which she was unable to  get around and she came in for further evaluation.  Denies having any significant chest pain, shortness of breath, loss of consciousness, fall, fever, chills, nausea, or vomiting.  ED Course: Upon admission into the emergency department the patient was found to be 164/104-196/77, and all other vitals within normal limits. Labs revealed WBC 9.7, hemoglobin 10.4, platelets 472 and other labs relatively within normal limits.  General surgery and gastroenterology were notified and recommended admission for observation overnight with likely need of repeat CT scan and interventional radiology.  [facility] called to admit.   Subjective:    Shannon Scott   No new complaints. {t states that she has not been eating well.   Assessment  & Plan :    Principal Problem:   Rectal bleeding Active Problems:   Hypothyroid   Essential hypertension   Normocytic anemia   Left hip pain   Generalized weakness  Rectal bleeding, pelvic mass: Patient presents with complaints of weakness and continued rectal bleeding.  CT scan from 3/21, sigmoid colon of the pelvis with signs of possible perforation and abscess Hemoglobin appears to be stable.  Patient found to have left lower lobe status post diverting loop colonoscopy on 3/24.  Recent scope was unable to obtain tissue biopsy. (Dr. Collene Mares)    Hospital course complicated by suspected GI bleed requiring transfusion and new diagnosis of DVT.   Adenocarcinoma - Consulted interventional radiology and patient is s/p bx. bx results are back reporting suspicion for adenocarcinoma of gynecological origin.  - Subsequently consulted Oncology to assist with case - Pt getting tranfused. Currently suspecting GI related GI bleed as such unable to anticoagulate for new DVT.   DVT -  Placed consult for IR to place IVC filter since patient is having suspected GI bleeding.  Generalized weakness - Most likely secondary to poor oral intake  Poor oral intake -Still poses risk to deterioration after hospital discharge due to poor oral intake. Minimal improvement with marinol  Hypokalemia -Resolved on last check  Acute blood loss anemia - Pt had IV iron repleted - 2 units of PRBC to be transfused. - d/c Dr. Collene Mares who states from a GI standpoint there is nothing they can do.  Left hip pain:  - Continue tramadol prn pain  Essential hypertension: Initially uncontrolled blood pressures elevated up to 196/77.  Patient was given home blood pressure medications. - Continue losartan - Hydralazine IV prn SBP>180 or dBP> 110   Paroxysmal atrial fibrillation: CHA2DS2-VASc score = 4.  Patient was not put on anticoagulation due to recent ostomy.  Now patient has rectal bleeding as an additional cause for not being placed on anticoagulant.    - Continue diltiazem   CAD s/p CABG   H/O endometrial cancer  Hypothyroidism - TSH pending - Continue levothyroxine  DVT prophylaxis: SCD   Code Status: Full Family Communication: Discussed plan of care with the patient  Disposition Plan: Discharge patient after oncology evaluation. Consults called: Oncology   Lab Results  Component Value Date   PLT 405 (H) 10/10/2017    Antibiotics  :  none  Anti-infectives (From admission, onward)   None        Objective:   Vitals:   10/10/17 1045 10/10/17 1100 10/10/17 1342 10/10/17 1410  BP: (!) 147/64 (!) 150/56 (!) 147/51 (!) 157/49  Pulse: 77 71 74 78  Resp: 18 18 18 18   Temp: 98.7 F (37.1 C) 98.8 F (37.1 C) 98.6 F (37 C) 98.6 F (37 C)  TempSrc: Oral  Oral Oral Oral  SpO2: 96% 93% 96% 97%  Weight:      Height:        Wt Readings from Last 3 Encounters:  10/10/17 67.3 kg (148 lb 5.9 oz)  08/29/17 69.4 kg (153 lb)  08/22/17 69.4 kg (153 lb)      Intake/Output Summary (Last 24 hours) at 10/10/2017 1522 Last data filed at 10/10/2017 1500 Gross per 24 hour  Intake 750 ml  Output 3 ml  Net 747 ml     Physical Exam, exam unchanged when compared to prior 10/09/2017  Awake and alert, Oriented x 3, in nad. .AT,PERRAL Supple Neck,No JVD, No cervical lymphadenopathy appriciated.  Symmetrical Chest wall movement, Good air movement bilaterally, CTAB RRR,No Gallops,Rubs or new Murmurs, No Parasternal Heave +ve B.Sounds, Abd Soft, No tenderness, No organomegaly appriciated, No rebound - guarding or rigidity. No Cyanosis, Clubbing or edema, No new Rash or bruise   colostomy    Data Review:    CBC Recent Labs  Lab 10/04/17 0616 10/05/17 0625 10/09/17 1039 10/10/17 0613  WBC 8.7 10.3 10.9* 8.4  HGB 9.1* 9.9* 8.4* 8.0*  HCT 28.4* 29.6* 26.2* 25.3*  PLT 421* 442* 415* 405*  MCV 85.5 85.3 84.5 84.9  MCH 27.4 28.5 27.1 26.8  MCHC 32.0 33.4 32.1 31.6  RDW 16.2* 16.2* 16.2* 16.1*  LYMPHSABS  --   --  1.1 1.1  MONOABS  --   --  0.4 0.5  EOSABS  --   --  0.2 0.2  BASOSABS  --   --  0.0 0.0    Chemistries  Recent Labs  Lab 10/04/17 0616 10/05/17 0625 10/05/17 2355 10/09/17 1039 10/10/17 0613  NA 140 138 135 139 139  K 3.2* 3.3* 4.1 3.8 3.7  CL 106 104 105 105 104  CO2 25 24 23 24 25   GLUCOSE 94 105* 132* 114* 96  BUN 6 5* 7 6 6   CREATININE 0.61 0.59 0.63 0.74 0.68  CALCIUM 9.0 9.1 8.7* 9.2 9.1  MG 1.7  --   --   --   --   AST  --  13*  --  18 11*  ALT  --  8*  --  7* 9*  ALKPHOS  --  67  --  66 61  BILITOT  --  0.4  --  0.5 0.3   ------------------------------------------------------------------------------------------------------------------ No results for input(s): CHOL, HDL, LDLCALC, TRIG, CHOLHDL, LDLDIRECT in the last 72 hours.  No results found for: HGBA1C ------------------------------------------------------------------------------------------------------------------ No results for input(s): TSH,  T4TOTAL, T3FREE, THYROIDAB in the last 72 hours.  Invalid input(s): FREET3 ------------------------------------------------------------------------------------------------------------------ Recent Labs    10/09/17 1036  FERRITIN 50  TIBC 199*  IRON 8*    Coagulation profile Recent Labs  Lab 10/05/17 0625  INR 1.06    No results for input(s): DDIMER in the last 72 hours.  Cardiac Enzymes No results for input(s): CKMB, TROPONINI, MYOGLOBIN in the last 168 hours.  Invalid input(s): CK ------------------------------------------------------------------------------------------------------------------ No results found for: BNP  Inpatient Medications  Scheduled Meds: . diclofenac sodium  2 g Topical QID  . diltiazem  300 mg Oral Daily  . dronabinol  2.5 mg Oral BID AC  . lactose free nutrition  237 mL Oral BID BM  . levothyroxine  50 mcg Oral QAC breakfast  . losartan  100 mg Oral Daily  . pantoprazole  40 mg Oral BID   Continuous Infusions: . sodium chloride Stopped (10/08/17 1000)   PRN Meds:.acetaminophen **OR** acetaminophen, ALPRAZolam,  hydrALAZINE, HYDROcodone-acetaminophen, morphine injection, ondansetron **OR** ondansetron (ZOFRAN) IV, polyethylene glycol, traMADol  Micro Results No results found for this or any previous visit (from the past 240 hour(s)).  Radiology Reports Ct Abdomen Pelvis W Contrast  Result Date: 10/03/2017 CLINICAL DATA:  Rectal bleeding.  Malaise. HPIPt had surgery 1 month ago associated with a pelvic /colonic mass. Pt had a diverting colostomy. She did not have the mass removed. Pt states since then she continues to have pain in the pelvic region . She also is having blood from her rectum ever since the surgery but it seems to be getting worse and the bleeding is more severe. She has been seeing her surgeons and She had a flexible sigmoidoscopy on Friday but they did not determine the cause. Pt is concerned because it is taking long to figure  out what is going on. They still dont have a tissue diagnosis. Pt called the surgeon today and was told to come to the ED. Pt feels like she is getting worse. PT did not take any of her medications today. EXAM: CT ABDOMEN AND PELVIS WITH CONTRAST TECHNIQUE: Multidetector CT imaging of the abdomen and pelvis was performed using the standard protocol following bolus administration of intravenous contrast. CONTRAST:  182m ISOVUE-300 IOPAMIDOL (ISOVUE-300) INJECTION 61% COMPARISON:  08/18/2017 FINDINGS: Lower chest: 4 mm pleural-based nodular opacity in the lateral left lower lobe, image 3, series 4, above the field of view from the most recent prior study. Present and similar in size on a CT dated 01/14/2016 consistent with a benign nodule. No acute findings. Heart normal in size. Hepatobiliary: No liver masses. Focal fat noted adjacent to the falciform ligament. No other lesions. Liver normal in size. A few small dependent densities are noted in the gallbladder consistent with stones. No wall thickening or inflammation. No bile duct dilation. Pancreas: Unremarkable. No pancreatic ductal dilatation or surrounding inflammatory changes. Spleen: Normal in size without focal abnormality. Adrenals/Urinary Tract: No adrenal masses. There is mild-to-moderate left renal collecting system dilation. A stent extends from the left renal pelvis through the left ureter to the bladder, well positioned. No right hydronephrosis. Small, 11 mm, lower pole renal cyst. No other renal masses. No intrarenal stones. Right ureters normal in course and in caliber. Bladder is deviated by the large pelvic mass, but otherwise unremarkable. Stomach/Bowel: A large heterogeneous mass arises from the sigmoid colon and extends to the pelvic sidewall involving the ileo psoas muscle. Mass measures approximately 6.9 cm from superior to inferior by 7 cm x 5.5 cm transversely. This has increased in size from the prior exam. No other colonic masses. There  are adjacent inflammatory changes and several prominent, subcentimeter lymph nodes. Multiple diverticula are noted along the colon, stable. A diverting loop colostomy has been performed in utilizing the mid transverse colon. Stomach shows a masslike area arising from the cardia extending to the gastroesophageal junction similar to the prior exam. This may be from previous hiatal hernia surgery or reflect a prominent fold. It is felt unlikely reflect neoplastic disease since it is stable when compared to 01/14/2016 CT. Small bowel is unremarkable. Vascular/Lymphatic: There are few prominent to mildly enlarged pelvic and retroperitoneal lymph nodes. Largest measuring 1 cm lying to the left of the aortic bifurcation. This node is increased in size from the prior CT where it measured 6 mm. Reproductive: Uterus is surgically absent. Other: Hazy inflammatory change is noted in the pelvis that has increased from the prior exam. No ascites. Musculoskeletal: No fracture or  acute finding. No osteoblastic or osteolytic lesions. IMPRESSION: 1. No acute findings. 2. 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 It extends to involve the left pelvic sidewall, specifically the ileo psoas muscle. There are now inflammatory type changes adjacent to the mass that have increased from prior exam. There are multiple prominent to borderline enlarged lymph nodes several increased in size from the prior exam. No other evidence of metastatic disease. 3. Loop colostomy lies in transverse colon has been performed since the prior study. No evidence of bowel obstruction. 4. There is left hydronephrosis despite a well-positioned left ureteral stent. No right hydronephrosis. 5. Few small gallstones.  Aortic atherosclerosis. 6. Masslike prominence in the gastric cardia is most likely a prominent fold. This could be from previous hiatal hernia surgery if this has been performed in the past. The appearance of this portion of the  stomach is stable from prior CTs dating back to August 2017. Electronically Signed   By: Lajean Manes M.D.   On: 10/03/2017 21:33   Ct Biopsy  Result Date: 10/05/2017 INDICATION: 77 year old female with a history of locally invasive exophytic sigmoid colonic mass highly concerning for adenocarcinoma. She has not undergone prior biopsy and presents for CT-guided biopsy to obtain tissue diagnosis. EXAM: CT guided tissue biopsy of left pelvic mass MEDICATIONS: None. ANESTHESIA/SEDATION: Moderate (conscious) sedation was employed during this procedure. A total of Versed 2 mg and Fentanyl 100 mcg was administered intravenously. Moderate Sedation Time: 12 minutes. The patient's level of consciousness and vital signs were monitored continuously by radiology nursing throughout the procedure under my direct supervision. FLUOROSCOPY TIME:  Fluoroscopy Time: 0 minutes 0 seconds (0 mGy). COMPLICATIONS: None immediate. PROCEDURE: Informed written consent was obtained from the patient after a thorough discussion of the procedural risks, benefits and alternatives. All questions were addressed. A timeout was performed prior to the initiation of the procedure. A planning axial CT scan was performed. The exophytic mass was identified in the left lower quadrant. A suitable skin entry site was selected medial to the inferior epigastric vessels. The overlying skin was prepped and marked. Under intermittent CT guidance, a 17 gauge introducer needle was advanced into the margin of the mass. Multiple 18 gauge core biopsies were then coaxially obtained and placed in formalin before being delivered to pathology for further analysis. The introducer needle was removed. Post biopsy axial CT imaging demonstrates no evidence of immediate complication. Patient tolerated the procedure well. IMPRESSION: Technically successful CT-guided biopsy of left pelvic mass. Electronically Signed   By: Jacqulynn Cadet M.D.   On: 10/05/2017 09:21     Time Spent in minutes  30 min   Velvet Bathe M.D on 10/10/2017 at 3:22 PM  Between 7am to 7pm - Pager - [phone]  After 7pm go to [email or web address] - password [facility]  [facility] -  Office  [phone]

## 2017-10-10 NOTE — Progress Notes (Addendum)
CC:  Weakness and rectal bleeding  Subjective: Still no appetite but eating without nausea and having BM's.  H/H is down and she is getting blood again today.  Objective: Vital signs in last 24 hours: Temp:  [98.4 F (36.9 C)-98.9 F (37.2 C)] 98.6 F (37 C) (05/13 0524) Pulse Rate:  [70-93] 70 (05/13 0524) Resp:  [18] 18 (05/13 0524) BP: (138-143)/(43-59) 139/43 (05/13 0524) SpO2:  [94 %-95 %] 94 % (05/13 0524) Weight:  [67.3 kg (148 lb 5.9 oz)] 67.3 kg (148 lb 5.9 oz) (05/13 0500) Last BM Date: 10/09/17 240 PO recorded Urine and stool not recorded Afebrile, VSS H/H continues to slowly decline, but otherwise stable Intake/Output from previous day: 05/12 0701 - 05/13 0700 In: 240 [P.O.:240] Out: -  Intake/Output this shift: Total I/O In: 120 [P.O.:120] Out: 2 [Urine:2]  General appearance: alert, cooperative and no distress Resp: clear to auscultation bilaterally GI: colostomy working well.    Lab Results:  Recent Labs    10/09/17 1039 10/10/17 0613  WBC 10.9* 8.4  HGB 8.4* 8.0*  HCT 26.2* 25.3*  PLT 415* 405*    BMET Recent Labs    10/09/17 1039 10/10/17 0613  NA 139 139  K 3.8 3.7  CL 105 104  CO2 24 25  GLUCOSE 114* 96  BUN 6 6  CREATININE 0.74 0.68  CALCIUM 9.2 9.1   PT/INR No results for input(s): LABPROT, INR in the last 72 hours.  Recent Labs  Lab 10/03/17 1236 10/05/17 0625 10/09/17 1039 10/10/17 0613  AST 13* 13* 18 11*  ALT 8* 8* 7* 9*  ALKPHOS 79 67 66 61  BILITOT 0.5 0.4 0.5 0.3  PROT 8.2* 7.3 6.9 6.7  ALBUMIN 3.6 3.2* 2.8* 2.6*     Lipase     Component Value Date/Time   LIPASE 24 09/30/2017 0012     Medications: . diclofenac sodium  2 g Topical QID  . diltiazem  300 mg Oral Daily  . dronabinol  2.5 mg Oral BID AC  . furosemide  20 mg Intravenous Once  . lactose free nutrition  237 mL Oral BID BM  . levothyroxine  50 mcg Oral QAC breakfast  . losartan  100 mg Oral Daily  . pantoprazole  40 mg Oral BID     Assessment/Plan  Atrial fibrillation with RVR CAD Carotid disease Left hip bursitis (primary source of pain) Hypertension Hypothyroid  Anemia Hyperlipidemia   Obstructing rectosigmoid colonic mass - ongoing rectal bleeding  CT-guided biopsy of exophytic left sigmoid colonic mass 10/05/2017, Dr. Jacqulynn Cadet Pathology:  Soft Tissue Needle Core Biopsy, left pelvic mass - POORLY DIFFERENTIATED CARCINOMA. Diagnosis Note Immunohistochemical stain shows that the tumor is positive for PAX-8; and negative for p16, p63 and CDX2. This immunoprofile is consistent with a gynecologic primary.  S/P laparoscopic diverting transverse loop colostomy, 08/21/2017 Dr. Marland Kitchen [place]  Left ureteral obstruction secondary to intrinsic compression from colonic mass  Left retrograde pyelogram with intraoperative interpretation, left JJ stent placement, 08/21/2017 Dr. Ellison Hughs  S/p Flexible sigmoidoscopy, 09/30/2017 Dr. Juanita Craver -preparation poor, diverticulosis in the sigmoid, no specimen obtained Oncology consult:  Dr. Marin Olp 10/09/17  FEN: IV fluids/regular diet ID: No antibiotics DVT: SCDs Follow up with Dr. Excell Seltzer (June 25 @ 2 PM)  Plan:  Nothing new to add at this point, she has follow up with Dr. Excell Seltzer listed above.  Will defer to Medicine and Oncology.  We will be available as needed please call.  LOS: 3 days   JENNINGS,WILLARD 10/10/2017 [phone]  Agree with above.  Alphonsa Overall, MD, [facility] Pager: [phone] Office phone:  [phone]

## 2017-10-10 NOTE — Progress Notes (Signed)
Referring Physician(s): Vega,O/Ennever,P  Supervising Physician: Markus Daft  Patient Status:  [facility] - In-pt  Chief Complaint: Left leg swelling/DVT, recent bleeding, uterine cancer   Subjective: Patient familiar to IR service from prior left pelvic fluid aspiration in December 2017 and most recently left pelvic mass biopsy on 10/05/2017 which revealed poorly differentiated carcinoma most consistent with GYN primary.  She has a prior history of uterine adenocarcinoma 2017 with prior hysterectomy.  She presents now with some recent GI bleeding and newly diagnosed left lower extremity DVT involving the proximal profunda and common femoral veins.  Request now received for IVC filter placement.  She continues to feel weak and has some lightheadedness.  Additional history as below. Past Medical History:  Diagnosis Date  . Arthritis   . Asthma    allergy induced  . Bilateral carotid artery disease (HCC)    L carotid bruit  . Bursitis    left hip  . CAD (coronary artery disease)   . Cancer ([place])   . Dyslipidemia    intolerant to statins, welchol, niacin, zetia  . History of blood transfusion   . History of nuclear stress test 04/24/2012   lexiscan; normal study  . Hypertension   . Hypothyroidism   . Postoperative nausea and vomiting 01/02/2016   Past Surgical History:  Procedure Laterality Date  . ABDOMINAL HYSTERECTOMY    . Carotid Doppler  02/2012   40-59% right int carotid artery stenosis; 60-79% L int carotid stenosis; L carotid bruit  . CORONARY ARTERY BYPASS GRAFT  03/12/2004   LIMA to LAD, SVG to circumflex, SVG to PDA  . CYSTOSCOPY WITH STENT PLACEMENT Bilateral 08/21/2017   Procedure: CYSTOSCOPY WITH STENT PLACEMENT;  Surgeon: Ceasar Mons, MD;  Location: [facility];  Service: Urology;  Laterality: Bilateral;  . IR GENERIC HISTORICAL  04/29/2016   IR RADIOLOGIST EVAL & MGMT 04/29/2016 Sandi Mariscal, MD [facility] INTERV RAD  . IR GENERIC HISTORICAL  05/12/2016   IR  RADIOLOGIST EVAL & MGMT 05/12/2016 Sandi Mariscal, MD [facility] INTERV RAD  . ROBOTIC ASSISTED TOTAL HYSTERECTOMY WITH BILATERAL SALPINGO OOPHERECTOMY Bilateral 12/30/2015   Procedure: XI ROBOTIC ASSISTED TOTAL HYSTERECTOMY WITH BILATERAL SALPINGO OOPHORECTOMY WITH SENTAL LYMPH NODE BIOPSY;  Surgeon: Nancy Marus, MD;  Location: [facility];  Service: Gynecology;  Laterality: Bilateral;  . TONSILLECTOMY    . TRANSTHORACIC ECHOCARDIOGRAM  04/2007   EF>55%; mild MR; mild-mod TR; mild pulm HTN; mild calcification of aortiv valve leaflets with mild valvular aortic stenosis  . TUBAL LIGATION         Allergies: Statins and Ciprofloxacin  Medications: Prior to Admission medications   Medication Sig Start Date End Date Taking? Authorizing Provider  acetaminophen (TYLENOL) 500 MG tablet Take 500 mg by mouth every 6 (six) hours as needed for mild pain, moderate pain, fever or headache.   Yes [provider]  ALPRAZolam Duanne Moron) 0.5 MG tablet Take 0.5 mg by mouth 2 (two) times daily. 09/30/17  Yes [provider]  diclofenac sodium (VOLTAREN) 1 % GEL Apply 2 g topically 4 (four) times daily.   Yes [provider]  diltiazem (TIAZAC) 300 MG 24 hr capsule Take 300 mg by mouth daily.   Yes [provider]  levothyroxine (SYNTHROID, LEVOTHROID) 50 MCG tablet Take 50 mcg by mouth daily before breakfast.    Yes [provider]  losartan (COZAAR) 100 MG tablet Take 100 mg by mouth daily.   Yes [provider]  pantoprazole (PROTONIX) 40 MG tablet Take  1 tablet (40 mg total) by mouth daily. 08/27/17  Yes Sheikh, Omair Latif, DO  polyethylene glycol (MIRALAX / GLYCOLAX) packet Take 17 g by mouth daily as needed for mild constipation.   Yes [provider]  traMADol (ULTRAM) 50 MG tablet Take 1 tablet (50 mg total) by mouth every 12 (twelve) hours as needed for moderate pain. 08/26/17  Yes Sheikh, Omair Latif, DO     Vital Signs: BP (!) 153/59   Pulse 83   Temp  98.8 F (37.1 C) (Oral)   Resp 18   Ht 5' 1"  (1.549 m)   Wt 148 lb 5.9 oz (67.3 kg)   SpO2 95%   BMI 28.03 kg/m   Physical Exam patient awake, alert.  Chest clear to auscultation bilaterally.  Heart with normal rate, occasional ectopy, positive murmur; abdomen soft, positive bowel sounds, colostomy intact, mildly tender left lower quadrant to palpation; left lower extremity edema noted.  Imaging: No results found.  Labs:  CBC: Recent Labs    10/04/17 0616 10/05/17 0625 10/09/17 1039 10/10/17 0613  WBC 8.7 10.3 10.9* 8.4  HGB 9.1* 9.9* 8.4* 8.0*  HCT 28.4* 29.6* 26.2* 25.3*  PLT 421* 442* 415* 405*    COAGS: Recent Labs    10/05/17 0625  INR 1.06    BMP: Recent Labs    10/05/17 0625 10/05/17 2355 10/09/17 1039 10/10/17 0613  NA 138 135 139 139  K 3.3* 4.1 3.8 3.7  CL 104 105 105 104  CO2 24 23 24 25   GLUCOSE 105* 132* 114* 96  BUN 5* 7 6 6   CALCIUM 9.1 8.7* 9.2 9.1  CREATININE 0.59 0.63 0.74 0.68  GFRNONAA >60 >60 >60 >60  GFRAA >60 >60 >60 >60    LIVER FUNCTION TESTS: Recent Labs    10/03/17 1236 10/05/17 0625 10/09/17 1039 10/10/17 0613  BILITOT 0.5 0.4 0.5 0.3  AST 13* 13* 18 11*  ALT 8* 8* 7* 9*  ALKPHOS 79 67 66 61  PROT 8.2* 7.3 6.9 6.7  ALBUMIN 3.6 3.2* 2.8* 2.6*    Assessment and Plan: Patient with history of uterine adenocarcinoma 2017, recent left pelvic mass biopsy yielding poorly differentiated carcinoma consistent with GYN primary.  Recent history of GI bleeding with newly diagnosed left lower extremity DVT (proximal profunda and common femoral vein).  Request received for IVC filter placement.  Imaging studies and history have been reviewed by Dr. Annamaria Boots.  She was deemed an appropriate candidate for IVC filter placement.Risks and benefits discussed with the patient/family including, but not limited to bleeding, infection, contrast induced renal failure, filter fracture or migration which can lead to emergency surgery or even death,  strut penetration with damage or irritation to adjacent structures and caval thrombosis.  All of the patient's questions were answered, patient is agreeable to proceed. Consent signed and in chart.  Procedure scheduled for 5/14   Electronically Signed: D. Rowe Robert, PA-C 10/10/2017, 4:54 PM   I spent a total of 30 minutes at the the patient's bedside AND on the patient's hospital floor or unit, greater than 50% of which was counseling/coordinating care for IVC filter placement    Patient ID: Shannon Scott, female   DOB: [birth year], 77 y.o.   MRN: [id number]

## 2017-10-10 NOTE — Progress Notes (Signed)
Preliminary notes--Left lower extremity duplex exam completed. Positive for deep vein and superficial vein thrombosis, involving CFV junction, GSV, SSV and profunda vein.  Result notified RN Brittney.    Hongying Kiyaan Haq (RDMS RVT) 10/10/17 10:45 AM

## 2017-10-11 ENCOUNTER — Inpatient Hospital Stay (HOSPITAL_COMMUNITY): Payer: Medicare Other

## 2017-10-11 ENCOUNTER — Ambulatory Visit
Admit: 2017-10-11 | Discharge: 2017-10-11 | Disposition: A | Discharge status: Home / Self Care | Payer: Medicare Other | Source: Ambulatory Visit | Attending: Radiation Oncology | Admitting: Radiation Oncology

## 2017-10-11 ENCOUNTER — Encounter (HOSPITAL_COMMUNITY): Payer: Self-pay | Admitting: Hematology & Oncology

## 2017-10-11 DIAGNOSIS — I82412 Acute embolism and thrombosis of left femoral vein: Secondary | ICD-10-CM

## 2017-10-11 DIAGNOSIS — I824Y2 Acute embolism and thrombosis of unspecified deep veins of left proximal lower extremity: Secondary | ICD-10-CM

## 2017-10-11 DIAGNOSIS — C539 Malignant neoplasm of cervix uteri, unspecified: Secondary | ICD-10-CM

## 2017-10-11 DIAGNOSIS — C541 Malignant neoplasm of endometrium: Secondary | ICD-10-CM

## 2017-10-11 DIAGNOSIS — Z7189 Other specified counseling: Secondary | ICD-10-CM

## 2017-10-11 DIAGNOSIS — C799 Secondary malignant neoplasm of unspecified site: Secondary | ICD-10-CM

## 2017-10-11 DIAGNOSIS — Z510 Encounter for antineoplastic radiation therapy: Secondary | ICD-10-CM | POA: Insufficient documentation

## 2017-10-11 HISTORY — DX: Malignant neoplasm of cervix uteri, unspecified: C53.9

## 2017-10-11 HISTORY — PX: IR IVC FILTER PLMT / S&I /IMG GUID/MOD SED: IMG701

## 2017-10-11 HISTORY — DX: Other specified counseling: Z71.89

## 2017-10-11 HISTORY — PX: IR US GUIDE VASC ACCESS RIGHT: IMG2390

## 2017-10-11 HISTORY — PX: IR FLUORO GUIDE PORT INSERTION RIGHT: IMG5741

## 2017-10-11 HISTORY — DX: Malignant neoplasm of cervix uteri, unspecified: C79.9

## 2017-10-11 LAB — TYPE AND SCREEN
ABO/RH(D): O POS
Antibody Screen: NEGATIVE
Unit division: 0
Unit division: 0

## 2017-10-11 LAB — CBC WITH DIFFERENTIAL/PLATELET
Basophils Absolute: 0 10*3/uL (ref 0.0–0.1)
Basophils Relative: 0 %
Eosinophils Absolute: 0.2 10*3/uL (ref 0.0–0.7)
Eosinophils Relative: 2 %
HCT: 32.9 % — ABNORMAL LOW (ref 36.0–46.0)
Hemoglobin: 10.9 g/dL — ABNORMAL LOW (ref 12.0–15.0)
Lymphocytes Relative: 17 %
Lymphs Abs: 1.4 10*3/uL (ref 0.7–4.0)
MCH: 28.2 pg (ref 26.0–34.0)
MCHC: 33.1 g/dL (ref 30.0–36.0)
MCV: 85 fL (ref 78.0–100.0)
Monocytes Absolute: 0.6 10*3/uL (ref 0.1–1.0)
Monocytes Relative: 7 %
Neutro Abs: 6.2 10*3/uL (ref 1.7–7.7)
Neutrophils Relative %: 74 %
Platelets: 454 10*3/uL — ABNORMAL HIGH (ref 150–400)
RBC: 3.87 MIL/uL (ref 3.87–5.11)
RDW: 15.6 % — ABNORMAL HIGH (ref 11.5–15.5)
WBC: 8.3 10*3/uL (ref 4.0–10.5)

## 2017-10-11 LAB — COMPREHENSIVE METABOLIC PANEL
ALT: 9 U/L — ABNORMAL LOW (ref 14–54)
AST: 13 U/L — ABNORMAL LOW (ref 15–41)
Albumin: 2.9 g/dL — ABNORMAL LOW (ref 3.5–5.0)
Alkaline Phosphatase: 67 U/L (ref 38–126)
Anion gap: 13 (ref 5–15)
BUN: 7 mg/dL (ref 6–20)
CO2: 26 mmol/L (ref 22–32)
Calcium: 9.5 mg/dL (ref 8.9–10.3)
Chloride: 101 mmol/L (ref 101–111)
Creatinine, Ser: 0.71 mg/dL (ref 0.44–1.00)
GFR calc Af Amer: >60 mL/min (ref >60)
GFR calc non Af Amer: >60 mL/min (ref >60)
Glucose, Bld: 91 mg/dL (ref 65–99)
Potassium: 3.5 mmol/L (ref 3.5–5.1)
Sodium: 140 mmol/L (ref 135–145)
Total Bilirubin: 0.8 mg/dL (ref 0.3–1.2)
Total Protein: 7.2 g/dL (ref 6.5–8.1)

## 2017-10-11 LAB — PROTIME-INR
INR: 1.05
Prothrombin Time: 13.6 seconds (ref 11.4–15.2)

## 2017-10-11 LAB — BPAM RBC
Blood Product Expiration Date: 201906022359
Blood Product Expiration Date: 201906032359
ISSUE DATE / TIME: 201905131048
ISSUE DATE / TIME: 201905131344
Unit Type and Rh: 5100
Unit Type and Rh: 5100

## 2017-10-11 MED ORDER — IOPAMIDOL (ISOVUE-300) INJECTION 61%
INTRAVENOUS | Status: AC
  Administered 2017-10-11: 30 mL via INTRAVENOUS
  Filled 2017-10-11: qty 50

## 2017-10-11 MED ORDER — MIDAZOLAM HCL 2 MG/2ML IJ SOLN
INTRAMUSCULAR | Status: AC
  Filled 2017-10-11: qty 4

## 2017-10-11 MED ORDER — MIDAZOLAM HCL 2 MG/2ML IJ SOLN
INTRAMUSCULAR | Status: AC | PRN
  Administered 2017-10-11 (×3): 1 mg via INTRAVENOUS

## 2017-10-11 MED ORDER — LIDOCAINE-EPINEPHRINE (PF) 2 %-1:200000 IJ SOLN
INTRAMUSCULAR | Status: AC
  Filled 2017-10-11: qty 20

## 2017-10-11 MED ORDER — CEFAZOLIN SODIUM-DEXTROSE 2-4 GM/100ML-% IV SOLN
INTRAVENOUS | Status: AC
  Administered 2017-10-11: 2 g via INTRAVENOUS
  Filled 2017-10-11: qty 100

## 2017-10-11 MED ORDER — LIDOCAINE HCL (PF) 1 % IJ SOLN
INTRAMUSCULAR | Status: AC
  Filled 2017-10-11: qty 30

## 2017-10-11 MED ORDER — FENTANYL CITRATE (PF) 100 MCG/2ML IJ SOLN
INTRAMUSCULAR | Status: AC
  Filled 2017-10-11: qty 2

## 2017-10-11 MED ORDER — CEFAZOLIN SODIUM-DEXTROSE 2-4 GM/100ML-% IV SOLN
2.0000 g | INTRAVENOUS | Status: AC
Start: 2017-10-11 — End: 2017-10-11
  Administered 2017-10-11: 2 g via INTRAVENOUS
  Filled 2017-10-11: qty 100

## 2017-10-11 MED ORDER — FENTANYL CITRATE (PF) 100 MCG/2ML IJ SOLN
INTRAMUSCULAR | Status: AC | PRN
  Administered 2017-10-11 (×2): 50 ug via INTRAVENOUS

## 2017-10-11 MED ORDER — IOPAMIDOL (ISOVUE-300) INJECTION 61%
100.0000 mL | Freq: Once | INTRAVENOUS | Status: AC | PRN
  Administered 2017-10-11 (×2): 30 mL via INTRAVENOUS

## 2017-10-11 MED ORDER — IOPAMIDOL (ISOVUE-300) INJECTION 61%
INTRAVENOUS | Status: AC
  Administered 2017-10-11: 30 mL via INTRAVENOUS
  Filled 2017-10-11: qty 100

## 2017-10-11 MED ORDER — LIDOCAINE HCL (PF) 1 % IJ SOLN
INTRAMUSCULAR | Status: AC | PRN
  Administered 2017-10-11: 5 mL

## 2017-10-11 NOTE — Progress Notes (Signed)
Patient ID: [name], female   DOB: 06/24/1940, 77 y.o.   MRN: [id number]                                                                PROGRESS NOTE                                                                                                                                                                                                             Patient Demographics:    Shannon Scott, is a 77 y.o. female, DOB - 1940/10/26, STM:[id number]  Admit date - 10/03/2017   Admitting Physician Norval Morton, MD  Outpatient Primary MD for the patient is Leeroy Cha, MD  LOS - 4  Outpatient Specialists:     No chief complaint on file.      Brief Narrative   77 y.o. female with medical history significant of CAD s/p CABG, endometrial cancer, A. fib with RVR, and  hypothyroidism; who presents with complaints of generalized weakness and continued rectal bleeding.   She just been recently hospitalized 3/21-3/29, after coming in with complaints of abdominal pain with nausea and vomiting.  Patient was found to have a ill-defined heterogeneous mass involving the sigmoid colon of the pelvis with signs of possible perforation and abscess during the hospitalization patient was evaluated by general surgery,  cardiology, urology, and orthopedic surgery.  She underwent JJ stenting and laparoscopic diverting loop colostomy by Dr. Excell Seltzer on 3/24.  She was initially discharged to a rehab facility, but has been home for a while.  Since being hospitalized patient reports still having rectal bleeding.  She will get the urge to have a bowel movement and when she goes notes clots of dark blood present.  Patient going once or twice per day.  She notes that she is just had generalized weakness and felt as though she may pass out.  Denies having any loss of consciousness or recent falls.  Her biggest complaint is continued left-sided hip pain that runs down her leg and into her groin area.   Just 3 days ago, she had a scope by Dr. Collene Mares of gastroenterology, but the mass was not able to be reached.  Patient notes her weakness symptoms were so severe today to the point which she was unable to  get around and she came in for further evaluation.    Hospital course complicated by acute GI related blood loss secondary to cancer of which GI is unable to do anything.  Patient recently required transfusion.  Further evaluation would reveal DVTs of which patient was sent to get an IVC filter placed.  Oncology consulted after biopsy results available and plan is for chemotherapy.    Subjective:    Shannon Scott   No new complaints reported today.    Assessment  & Plan :    Principal Problem:   Rectal bleeding Active Problems:   Hypothyroid   Essential hypertension   Normocytic anemia   Left hip pain   Generalized weakness   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 (HCC)   Goals of care, counseling/discussion  Rectal bleeding, pelvic mass: Patient presents with complaints of weakness and continued rectal bleeding.  CT scan from 3/21, sigmoid colon of the pelvis with signs of possible perforation and abscess Hemoglobin appears to be stable.  Patient found to have left lower lobe status post diverting loop colonoscopy on 3/24.  Recent scope was unable to obtain tissue biopsy. (Dr. Collene Mares)    Hospital course complicated by suspected GI bleed requiring transfusion and new diagnosis of DVT.  Patient was transfused 2 units of packed red blood cells.  Adenocarcinoma - Consulted interventional radiology and patient is s/p bx. bx results are back reporting suspicion for adenocarcinoma of gynecological origin.  - Subsequently consulted Oncology to assist with case, plan is to transfer patient to the third floor to start chemotherapy. - Pt getting tranfused. Currently suspecting GI related GI bleed as such unable to anticoagulate for new DVT.   DVT -  Placed consult for IR to place IVC filter since patient is having suspected GI bleeding.  Generalized weakness - Most likely secondary to poor oral intake  Poor oral intake -Still poses risk to deterioration after hospital discharge due to poor oral intake. Minimal improvement with marinol  Hypokalemia -Resolved on last check  Acute blood loss anemia - Pt had IV iron repleted 5/13 - 2 units of PRBC to be transfused. 5/13 - d/c Dr. Collene Mares who states from a GI standpoint there is nothing they can do.  Left hip pain:  - Continue tramadol prn pain  Essential hypertension: Initially uncontrolled blood pressures elevated up to 196/77.  Patient was given home blood pressure medications. - Continue losartan - Hydralazine IV prn SBP>180 or dBP> 110   Paroxysmal atrial fibrillation: CHA2DS2-VASc score = 4.  Patient was not put on anticoagulation due to recent ostomy.  Now patient has rectal bleeding as an additional cause for not being placed on anticoagulant.    - Continue diltiazem   CAD s/p CABG   H/O endometrial cancer  Hypothyroidism - TSH pending - Continue levothyroxine  DVT prophylaxis: SCD   Code Status: Full Family Communication: Discussed plan of care with the patient  Disposition Plan: Discharge patient after oncology evaluation. Consults called: Oncology   Lab Results  Component Value Date   PLT 454 (H) 10/11/2017    Antibiotics  :  none  Anti-infectives (From admission, onward)   Start     Dose/Rate Route Frequency Ordered Stop   10/11/17 1500  ceFAZolin (ANCEF) IVPB 2g/100 mL premix     2 g 200 mL/hr over 30 Minutes Intravenous To Radiology 10/11/17 1015 10/11/17 1254        Objective:   Vitals:   10/11/17 1305 10/11/17  1310 10/11/17 1315 10/11/17 1517  BP: (!) 157/57 (!) 156/61 (!) 151/64 (!) 184/60  Pulse: 70 68 70 76  Resp: 16 14 (!) 8 16  Temp:    98.4 F (36.9 C)  TempSrc:      SpO2: 97% 96% 99% 94%  Weight:      Height:        Wt  Readings from Last 3 Encounters:  10/11/17 63.8 kg (140 lb 10.5 oz)  08/29/17 69.4 kg (153 lb)  08/22/17 69.4 kg (153 lb)     Intake/Output Summary (Last 24 hours) at 10/11/2017 1709 Last data filed at 10/11/2017 1500 Gross per 24 hour  Intake 0 ml  Output -  Net 0 ml     Physical Exam, exam unchanged when compared to prior 10/10/2017  Awake and alert, Oriented x 3, in nad. [place].AT,PERRAL Supple Neck,No JVD, No cervical lymphadenopathy appriciated.  Symmetrical Chest wall movement, Good air movement bilaterally, CTAB RRR,No Gallops,Rubs or new Murmurs, No Parasternal Heave +ve B.Sounds, Abd Soft, No tenderness, No organomegaly appriciated, No rebound - guarding or rigidity. No Cyanosis, Clubbing or edema, No new Rash or bruise   colostomy    Data Review:    CBC Recent Labs  Lab 10/05/17 0625 10/09/17 1039 10/10/17 0613 10/11/17 0541  WBC 10.3 10.9* 8.4 8.3  HGB 9.9* 8.4* 8.0* 10.9*  HCT 29.6* 26.2* 25.3* 32.9*  PLT 442* 415* 405* 454*  MCV 85.3 84.5 84.9 85.0  MCH 28.5 27.1 26.8 28.2  MCHC 33.4 32.1 31.6 33.1  RDW 16.2* 16.2* 16.1* 15.6*  LYMPHSABS  --  1.1 1.1 1.4  MONOABS  --  0.4 0.5 0.6  EOSABS  --  0.2 0.2 0.2  BASOSABS  --  0.0 0.0 0.0    Chemistries  Recent Labs  Lab 10/05/17 0625 10/05/17 2355 10/09/17 1039 10/10/17 0613 10/11/17 0541  NA 138 135 139 139 140  K 3.3* 4.1 3.8 3.7 3.5  CL 104 105 105 104 101  CO2 24 23 24 25 26   GLUCOSE 105* 132* 114* 96 91  BUN 5* 7 6 6 7   CREATININE 0.59 0.63 0.74 0.68 0.71  CALCIUM 9.1 8.7* 9.2 9.1 9.5  AST 13*  --  18 11* 13*  ALT 8*  --  7* 9* 9*  ALKPHOS 67  --  66 61 67  BILITOT 0.4  --  0.5 0.3 0.8   ------------------------------------------------------------------------------------------------------------------ No results for input(s): CHOL, HDL, LDLCALC, TRIG, CHOLHDL, LDLDIRECT in the last 72 hours.  No results found for:  HGBA1C ------------------------------------------------------------------------------------------------------------------ No results for input(s): TSH, T4TOTAL, T3FREE, THYROIDAB in the last 72 hours.  Invalid input(s): FREET3 ------------------------------------------------------------------------------------------------------------------ Recent Labs    10/09/17 1036  FERRITIN 50  TIBC 199*  IRON 8*    Coagulation profile Recent Labs  Lab 10/05/17 0625 10/11/17 0541  INR 1.06 1.05    No results for input(s): DDIMER in the last 72 hours.  Cardiac Enzymes No results for input(s): CKMB, TROPONINI, MYOGLOBIN in the last 168 hours.  Invalid input(s): CK ------------------------------------------------------------------------------------------------------------------ No results found for: BNP  Inpatient Medications  Scheduled Meds: . diclofenac sodium  2 g Topical QID  . diltiazem  300 mg Oral Daily  . dronabinol  2.5 mg Oral BID AC  . fentaNYL      . lactose free nutrition  237 mL Oral BID BM  . levothyroxine  50 mcg Oral QAC breakfast  . lidocaine (PF)      . lidocaine-EPINEPHrine      .  losartan  100 mg Oral Daily  . midazolam      . pantoprazole  40 mg Oral BID   Continuous Infusions: . sodium chloride Stopped (10/08/17 1000)   PRN Meds:.acetaminophen (TYLENOL) oral liquid 160 mg/5 mL, [DISCONTINUED] acetaminophen **OR** acetaminophen, ALPRAZolam, hydrALAZINE, morphine injection, ondansetron **OR** ondansetron (ZOFRAN) IV, polyethylene glycol, traMADol  Micro Results No results found for this or any previous visit (from the past 240 hour(s)).  Radiology Reports Ct Abdomen Pelvis W Contrast  Result Date: 10/03/2017 CLINICAL DATA:  Rectal bleeding.  Malaise. HPIPt had surgery 1 month ago associated with a pelvic /colonic mass. Pt had a diverting colostomy. She did not have the mass removed. Pt states since then she continues to have pain in the pelvic region . She  also is having blood from her rectum ever since the surgery but it seems to be getting worse and the bleeding is more severe. She has been seeing her surgeons and She had a flexible sigmoidoscopy on Friday but they did not determine the cause. Pt is concerned because it is taking long to figure out what is going on. They still dont have a tissue diagnosis. Pt called the surgeon today and was told to come to the ED. Pt feels like she is getting worse. PT did not take any of her medications today. EXAM: CT ABDOMEN AND PELVIS WITH CONTRAST TECHNIQUE: Multidetector CT imaging of the abdomen and pelvis was performed using the standard protocol following bolus administration of intravenous contrast. CONTRAST:  137m ISOVUE-300 IOPAMIDOL (ISOVUE-300) INJECTION 61% COMPARISON:  08/18/2017 FINDINGS: Lower chest: 4 mm pleural-based nodular opacity in the lateral left lower lobe, image 3, series 4, above the field of view from the most recent prior study. Present and similar in size on a CT dated 01/14/2016 consistent with a benign nodule. No acute findings. Heart normal in size. Hepatobiliary: No liver masses. Focal fat noted adjacent to the falciform ligament. No other lesions. Liver normal in size. A few small dependent densities are noted in the gallbladder consistent with stones. No wall thickening or inflammation. No bile duct dilation. Pancreas: Unremarkable. No pancreatic ductal dilatation or surrounding inflammatory changes. Spleen: Normal in size without focal abnormality. Adrenals/Urinary Tract: No adrenal masses. There is mild-to-moderate left renal collecting system dilation. A stent extends from the left renal pelvis through the left ureter to the bladder, well positioned. No right hydronephrosis. Small, 11 mm, lower pole renal cyst. No other renal masses. No intrarenal stones. Right ureters normal in course and in caliber. Bladder is deviated by the large pelvic mass, but otherwise unremarkable. Stomach/Bowel: A  large heterogeneous mass arises from the sigmoid colon and extends to the pelvic sidewall involving the ileo psoas muscle. Mass measures approximately 6.9 cm from superior to inferior by 7 cm x 5.5 cm transversely. This has increased in size from the prior exam. No other colonic masses. There are adjacent inflammatory changes and several prominent, subcentimeter lymph nodes. Multiple diverticula are noted along the colon, stable. A diverting loop colostomy has been performed in utilizing the mid transverse colon. Stomach shows a masslike area arising from the cardia extending to the gastroesophageal junction similar to the prior exam. This may be from previous hiatal hernia surgery or reflect a prominent fold. It is felt unlikely reflect neoplastic disease since it is stable when compared to 01/14/2016 CT. Small bowel is unremarkable. Vascular/Lymphatic: There are few prominent to mildly enlarged pelvic and retroperitoneal lymph nodes. Largest measuring 1 cm lying to the left of  the aortic bifurcation. This node is increased in size from the prior CT where it measured 6 mm. Reproductive: Uterus is surgically absent. Other: Hazy inflammatory change is noted in the pelvis that has increased from the prior exam. No ascites. Musculoskeletal: No fracture or acute finding. No osteoblastic or osteolytic lesions. IMPRESSION: 1. No acute findings. 2. 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 It extends to involve the left pelvic sidewall, specifically the ileo psoas muscle. There are now inflammatory type changes adjacent to the mass that have increased from prior exam. There are multiple prominent to borderline enlarged lymph nodes several increased in size from the prior exam. No other evidence of metastatic disease. 3. Loop colostomy lies in transverse colon has been performed since the prior study. No evidence of bowel obstruction. 4. There is left hydronephrosis despite a well-positioned left ureteral  stent. No right hydronephrosis. 5. Few small gallstones.  Aortic atherosclerosis. 6. Masslike prominence in the gastric cardia is most likely a prominent fold. This could be from previous hiatal hernia surgery if this has been performed in the past. The appearance of this portion of the stomach is stable from prior CTs dating back to August 2017. Electronically Signed   By: Lajean Manes M.D.   On: 10/03/2017 21:33   Ir Ivc Filter Plmt / S&i /img Guid/mod Sed  Result Date: 10/11/2017 INDICATION: 77 year old with 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 Patient has a DVT in the left lower extremity. Patient has recent GI bleeding and she is not a candidate for anticoagulation at this time. Plan for placement of an IVC filter. EXAM: IVC FILTER PLACEMENT; IVC VENOGRAM; ULTRASOUND FOR VASCULAR ACCESS Physician: Stephan Minister. Anselm Pancoast, MD MEDICATIONS: None. ANESTHESIA/SEDATION: 1 mg Versed. This procedure was performed immediately following the Port-A-Cath placement. The patient was continuously monitored during the procedure by the interventional radiology nurse under my direct supervision. CONTRAST:  80 mL Isovue-300 FLUOROSCOPY TIME:  Fluoroscopy Time: 1 minutes 30 seconds (28 mGy). COMPLICATIONS: None immediate. PROCEDURE: The procedure was explained to the patient. The risks and benefits of the procedure were discussed and the patient's questions were addressed. Informed consent was obtained from the patient. Ultrasound demonstrated a patent right common femoral vein. Ultrasound images were obtained for documentation. The right groin was prepped and draped in a sterile fashion. Maximal barrier sterile technique was utilized including caps, mask, sterile gowns, sterile gloves, sterile drape, hand hygiene and skin antiseptic. The skin was anesthetized with 1% lidocaine. A 21 gauge needle was directed into the vein with ultrasound guidance and a micropuncture dilator set was placed. A wire was advanced into the IVC.  The filter sheath was advanced over the wire into the IVC. An IVC venogram was performed. Fluoroscopic images were obtained for documentation. A Bard Denali filter was deployed below the lowest renal vein. A follow-up venogram was performed and the vascular sheath was removed with manual compression. FINDINGS: IVC was patent. Bilateral renal veins were identified. The filter was deployed below the lowest renal vein. Follow-up venogram confirmed placement within the IVC and below the renal veins. IMPRESSION: Successful placement of a retrievable IVC filter. PLAN: Due to patient related comorbidities and/or clinical necessity, this IVC filter should be considered a permanent device. This patient will not be actively followed for future filter retrieval. Electronically Signed   By: Markus Daft M.D.   On: 10/11/2017 14:55   Ir US Guide Vasc Access Right  Result Date: 10/11/2017 INDICATION: 77 year old with history of uterine cancer. Recently  biopsied pelvic mass demonstrates a poorly differentiated carcinoma. Patient needs Port-A-Cath for chemotherapy. EXAM: FLUOROSCOPIC AND ULTRASOUND GUIDED PLACEMENT OF A SUBCUTANEOUS PORT COMPARISON:  None. MEDICATIONS: Ancef 2 g; The antibiotic was administered within an appropriate time interval prior to skin puncture. ANESTHESIA/SEDATION: Versed 2.0 mg IV; Fentanyl 100 mcg IV; Moderate Sedation Time:  35 minutes The patient was continuously monitored during the procedure by the interventional radiology nurse under my direct supervision. FLUOROSCOPY TIME:  54 seconds, 3 mGy COMPLICATIONS: None immediate. PROCEDURE: The procedure, risks, benefits, and alternatives were explained to the patient. Questions regarding the procedure were encouraged and answered. The patient understands and consents to the procedure. Patient was placed supine on the interventional table. Ultrasound confirmed a patent right internal jugular vein. The right chest and neck were cleaned with a skin  antiseptic and a sterile drape was placed. Maximal barrier sterile technique was utilized including caps, mask, sterile gowns, sterile gloves, sterile drape, hand hygiene and skin antiseptic. The right neck was anesthetized with 1% lidocaine. Small incision was made in the right neck with a blade. Micropuncture set was placed in the right internal jugular vein with ultrasound guidance. The micropuncture wire was used for measurement purposes. The right chest was anesthetized with 1% lidocaine with epinephrine. #15 blade was used to make an incision and a subcutaneous port pocket was formed. [place] was assembled. Subcutaneous tunnel was formed with a stiff tunneling device. The port catheter was brought through the subcutaneous tunnel. The port was placed in the subcutaneous pocket. The micropuncture set was exchanged for a peel-away sheath. The catheter was placed through the peel-away sheath and the tip was positioned near the superior cavoatrial junction. Catheter placement was confirmed with fluoroscopy. The port was accessed and flushed with saline. The port pocket was closed using two layers of absorbable sutures and Dermabond. The vein skin site was closed using a single layer of absorbable suture and Dermabond. Sterile dressings were applied. Patient tolerated the procedure well without an immediate complication. Ultrasound confirmed a patent right internal jugular vein. Ultrasound was used for vascular guidance. Ultrasound image was saved for documentation. Fluoroscopic images were taken and saved for this procedure. IMPRESSION: Placement of a subcutaneous port device. This is a CT injectable Port-A-Cath. Electronically Signed   By: Markus Daft M.D.   On: 10/11/2017 14:49   Ct Biopsy  Result Date: 10/05/2017 INDICATION: 77 year old female with a history of locally invasive exophytic sigmoid colonic mass highly concerning for adenocarcinoma. She has not undergone prior biopsy and presents for  CT-guided biopsy to obtain tissue diagnosis. EXAM: CT guided tissue biopsy of left pelvic mass MEDICATIONS: None. ANESTHESIA/SEDATION: Moderate (conscious) sedation was employed during this procedure. A total of Versed 2 mg and Fentanyl 100 mcg was administered intravenously. Moderate Sedation Time: 12 minutes. The patient's level of consciousness and vital signs were monitored continuously by radiology nursing throughout the procedure under my direct supervision. FLUOROSCOPY TIME:  Fluoroscopy Time: 0 minutes 0 seconds (0 mGy). COMPLICATIONS: None immediate. PROCEDURE: Informed written consent was obtained from the patient after a thorough discussion of the procedural risks, benefits and alternatives. All questions were addressed. A timeout was performed prior to the initiation of the procedure. A planning axial CT scan was performed. The exophytic mass was identified in the left lower quadrant. A suitable skin entry site was selected medial to the inferior epigastric vessels. The overlying skin was prepped and marked. Under intermittent CT guidance, a 17 gauge introducer needle was advanced into  the margin of the mass. Multiple 18 gauge core biopsies were then coaxially obtained and placed in formalin before being delivered to pathology for further analysis. The introducer needle was removed. Post biopsy axial CT imaging demonstrates no evidence of immediate complication. Patient tolerated the procedure well. IMPRESSION: Technically successful CT-guided biopsy of left pelvic mass. Electronically Signed   By: Jacqulynn Cadet M.D.   On: 10/05/2017 09:21   Ir Fluoro Guide Port Insertion Right  Result Date: 10/11/2017 INDICATION: 77 year old with history of uterine cancer. Recently biopsied pelvic mass demonstrates a poorly differentiated carcinoma. Patient needs Port-A-Cath for chemotherapy. EXAM: FLUOROSCOPIC AND ULTRASOUND GUIDED PLACEMENT OF A SUBCUTANEOUS PORT COMPARISON:  None. MEDICATIONS: Ancef 2 g; The  antibiotic was administered within an appropriate time interval prior to skin puncture. ANESTHESIA/SEDATION: Versed 2.0 mg IV; Fentanyl 100 mcg IV; Moderate Sedation Time:  35 minutes The patient was continuously monitored during the procedure by the interventional radiology nurse under my direct supervision. FLUOROSCOPY TIME:  54 seconds, 3 mGy COMPLICATIONS: None immediate. PROCEDURE: The procedure, risks, benefits, and alternatives were explained to the patient. Questions regarding the procedure were encouraged and answered. The patient understands and consents to the procedure. Patient was placed supine on the interventional table. Ultrasound confirmed a patent right internal jugular vein. The right chest and neck were cleaned with a skin antiseptic and a sterile drape was placed. Maximal barrier sterile technique was utilized including caps, mask, sterile gowns, sterile gloves, sterile drape, hand hygiene and skin antiseptic. The right neck was anesthetized with 1% lidocaine. Small incision was made in the right neck with a blade. Micropuncture set was placed in the right internal jugular vein with ultrasound guidance. The micropuncture wire was used for measurement purposes. The right chest was anesthetized with 1% lidocaine with epinephrine. #15 blade was used to make an incision and a subcutaneous port pocket was formed. [place] was assembled. Subcutaneous tunnel was formed with a stiff tunneling device. The port catheter was brought through the subcutaneous tunnel. The port was placed in the subcutaneous pocket. The micropuncture set was exchanged for a peel-away sheath. The catheter was placed through the peel-away sheath and the tip was positioned near the superior cavoatrial junction. Catheter placement was confirmed with fluoroscopy. The port was accessed and flushed with saline. The port pocket was closed using two layers of absorbable sutures and Dermabond. The vein skin site was closed  using a single layer of absorbable suture and Dermabond. Sterile dressings were applied. Patient tolerated the procedure well without an immediate complication. Ultrasound confirmed a patent right internal jugular vein. Ultrasound was used for vascular guidance. Ultrasound image was saved for documentation. Fluoroscopic images were taken and saved for this procedure. IMPRESSION: Placement of a subcutaneous port device. This is a CT injectable Port-A-Cath. Electronically Signed   By: Markus Daft M.D.   On: 10/11/2017 14:49    Time Spent in minutes  20 min   Velvet Bathe M.D on 10/11/2017 at 5:09 PM  Between 7am to 7pm - Pager - [phone]  After 7pm go to [email or web address] - password [facility]  [facility] -  Office  [phone]

## 2017-10-11 NOTE — Progress Notes (Signed)
Report called to Shannon Scott to transfer patient to [facility] [place] [facility]. Patient advised she will be transferring and is aware. Will c/t monitor.

## 2017-10-11 NOTE — Progress Notes (Signed)
Patient ID: Shannon Scott, female   DOB: [birth year], 77 y.o.   MRN: [id number] Request also received today for port a cath placement in pt. Risks and benefits of image guided port-a-catheter placement was discussed with the patient/family including, but not limited to bleeding, infection, pneumothorax, or fibrin sheath development and need for additional procedures.  All of the patient's questions were answered, patient is agreeable to proceed. Consent signed and in chart.  Will plan to do procedure today at time of IVC filter placement

## 2017-10-11 NOTE — Progress Notes (Signed)
Nutrition Follow-up  DOCUMENTATION CODES:   Not applicable  INTERVENTION:   Boost Plus chocolate BID- Each supplement provides 360kcal and 14g protein.    NUTRITION DIAGNOSIS:   Inadequate oral intake related to poor appetite, nausea as evidenced by per patient/family report.  Progressing  GOAL:   Patient will meet greater than or equal to 90% of their needs  Meeting PO  MONITOR:   PO intake, Supplement acceptance, Weight trends, Labs  REASON FOR ASSESSMENT:   Malnutrition Screening Tool    ASSESSMENT:   Patient with PMH significant for CAD s/p CABG, endometrial cancer, and A. fib with RVR. Recently admitted 3/21-3/29 and was found to have a mass involving the sigmoid colon of the pelvis with signs of possible perforation and abscess. Underwent diverting loop colostomy, left retrograde pyelogram with left JJ stent placement on 3/24. Presents this admission with rectal bleeding and pelvic mass.   Pt NPO for Port-A-Cath placement this afternoon. Meal completions charted as 85-100% for her last five meals. Pt continues to complain of poor appetite but is eating. Nausea symptoms have resolved.  Pt did not receive her Boost this morning. Will continue with this intervention. Weight noted to increase 2 lb since last RD visit on 5/7 (138 lb to 140 lb).   Medications reviewed and include: marinol Labs reviewed.   Diet Order:   Diet Order           Diet NPO time specified Except for: Sips with Meds  Diet effective midnight          EDUCATION NEEDS:   Education needs have been addressed  Skin:  Skin Assessment: Reviewed RN Assessment  Last BM:  10/09/17  Height:   Ht Readings from Last 1 Encounters:  10/03/17 5\' 1"  (1.549 m)    Weight:   Wt Readings from Last 1 Encounters:  10/11/17 140 lb 10.5 oz (63.8 kg)    Ideal Body Weight:  47.7 kg  BMI:  Body mass index is 26.58 kg/m.  Estimated Nutritional Needs:   Kcal:  1300-1500 kcal  Protein:  65-75  g  Fluid:  >1.3 L/day    Mariana Single RD, LDN Clinical Nutrition Pager # [phone]

## 2017-10-11 NOTE — Progress Notes (Signed)
Shannon Scott, unfortunately, has blood clots in her left leg.  I had ordered a Doppler of her left leg on Sunday.  She had this done yesterday.  She has thrombus in the proximal profunda vein and common femoral vein.  This is in the left leg.  I suppose this is not surprising since she has this pelvic mass on the left side which probably is causing some compression of her pelvic vasculature.  She is going for a IVC filter today.  She cannot be placed on anticoagulation because of bleeding.  She did get 2 units of blood yesterday.  She did get IV iron yesterday.  Her hemoglobin today is 10.9.  Her platelet count is 454,000.  Her sodium 140.  Potassium 3.5.  Creatinine 0.71.  LFTs look okay.  Albumin is 2.9.  She has been seen by radiation oncology.  I very much appreciate Dr. Clabe Seal expert input and recommendations.  I totally agree with his approach.  I did speak with pathology yesterday.  The pathologist says that this tumor is endometrial.  I suspect it has to be from the tumor that was removed back in 2017.  This really surprise me however because what she had removed in 2017 was not all that bad it was small and really was not that invasive.  All lymph nodes were negative.  Given that this is uterine cancer, I really believe that she would benefit from combination of radiation and chemotherapy.  I would use low-dose weekly carboplatin/Taxol.  I think this would be very appropriate for her.  I spoke to Shannon Scott and her daughter about this.  I explained why I thought that a combination of radiation and chemotherapy would work.  Again, the radiation will do the bulk of the work but the low-dose chemotherapy will improve radiations effectiveness.  She will need to have a Port-A-Cath placed.  I talked to her about a Port-A-Cath.  I explained what a Port-A-Cath is.  We will have radiology put this in for [place].  She will need to be moved down to 3 W. for treatment.  I spoke to 1 of the nurses on  3 W. this morning.  I told her what we needed and that they will plan to have a bed for her either later today or tomorrow.  Shannon Scott definitely is incredibly complicated.  There are a lot of issues that are ongoing with her.  I would like to hope that once she starts treatment and bleeding subsides, we can then get her on anticoagulation.  She is obviously hypercoagulable from the malignancy.  I spent about 45 minutes with Shannon Scott and her daughter.  I went over the side effects of chemotherapy.  I am sure that she will have chemotherapy teaching while in the hospital.  I would like to be somewhat aggressive with her.  She seemed to be doing quite well before she had this tumor and I want to see about getting her back to that quality of life.  [name], MD  [place] 15:2

## 2017-10-11 NOTE — Sedation Documentation (Signed)
Port completed 

## 2017-10-11 NOTE — Progress Notes (Signed)
  Radiation Oncology         ([phone]) [phone] ________________________________  Name: Shannon Scott MRN: [id number]  Date: 10/11/2017  DOB: August 12, 1940  SIMULATION AND TREATMENT PLANNING NOTE    ICD-10-CM   1. Endometrial cancer ([place]) C54.1     DIAGNOSIS:  77 y.o. female with Left pelvic mass resulting in bowel obstruction and ureteral obstruction, poorly differentiated carcinoma consistent with gynecologic primary ( recurrent endometrial cancer)    NARRATIVE:  The patient was brought to the [facility].  Identity was confirmed.  All relevant records and images related to the planned course of therapy were reviewed.  The patient freely provided informed written consent to proceed with treatment after reviewing the details related to the planned course of therapy. The consent form was witnessed and verified by the simulation staff.  Then, the patient was set-up in a stable reproducible  supine position for radiation therapy.  CT images were obtained.  Surface markings were placed.  The CT images were loaded into the planning software.  Then the target and avoidance structures were contoured.  Treatment planning then occurred.  The radiation prescription was entered and confirmed.  Then, I designed and supervised the construction of a total of 5 medically necessary complex treatment devices.  I have requested : 3D Simulation  I have requested a DVH of the following structures: GTV, PTV  Left femoral head, .  I have ordered:dose calc.  PLAN:  The patient will receive 45 Gy in 25 fractions along with radiosensitizing chemotherapy.   Special Treatment Procedure Note: The patient will be receiving radiosensitizing chemotherapy. Given the potential of increased toxicities related to combined therapy and the necessity for close monitoring of the patient and blood work, this constitutes a special treatment procedure.  -----------------------------------  Blair Promise, PhD, MD

## 2017-10-11 NOTE — Sedation Documentation (Signed)
Patient snoring 

## 2017-10-11 NOTE — Care Management Important Message (Signed)
Important Message  Patient Details  Name: Shannon Scott MRN: [id number] Date of Birth: Oct 16, 1940   Medicare Important Message Given:  Yes    Kerin Salen 10/11/2017, 11:15 AMImportant Message  Patient Details  Name: Shannon Scott MRN: [id number] Date of Birth: 02-Jul-1940   Medicare Important Message Given:  Yes    Kerin Salen 10/11/2017, 11:15 AM

## 2017-10-11 NOTE — Sedation Documentation (Signed)
Starting IVC

## 2017-10-11 NOTE — Procedures (Signed)
  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  Needs IVC filter because not anticoagulation candidate due to GI bleeding  Procedure: Portacath placement; IVC filter placement  Findings: Right IJ port, tip at SVC/RA junction. IVC filter placed below renal veins  Complications: None     EBL: Minimal  Plan: Bedrest 2 hours and keep right hip straight.

## 2017-10-12 ENCOUNTER — Ambulatory Visit: Payer: Medicare Other | Admitting: Radiation Oncology

## 2017-10-12 ENCOUNTER — Ambulatory Visit
Admit: 2017-10-12 | Discharge: 2017-10-12 | Disposition: A | Discharge status: Home / Self Care | Payer: Medicare Other | Attending: Radiation Oncology | Admitting: Radiation Oncology

## 2017-10-12 DIAGNOSIS — Z933 Colostomy status: Secondary | ICD-10-CM

## 2017-10-12 DIAGNOSIS — C55 Malignant neoplasm of uterus, part unspecified: Secondary | ICD-10-CM

## 2017-10-12 DIAGNOSIS — Z86718 Personal history of other venous thrombosis and embolism: Secondary | ICD-10-CM

## 2017-10-12 DIAGNOSIS — R630 Anorexia: Secondary | ICD-10-CM

## 2017-10-12 DIAGNOSIS — C541 Malignant neoplasm of endometrium: Secondary | ICD-10-CM

## 2017-10-12 DIAGNOSIS — I82402 Acute embolism and thrombosis of unspecified deep veins of left lower extremity: Secondary | ICD-10-CM

## 2017-10-12 DIAGNOSIS — D62 Acute posthemorrhagic anemia: Secondary | ICD-10-CM

## 2017-10-12 DIAGNOSIS — R1900 Intra-abdominal and pelvic swelling, mass and lump, unspecified site: Secondary | ICD-10-CM

## 2017-10-12 LAB — CBC WITH DIFFERENTIAL/PLATELET
Basophils Absolute: 0 10*3/uL (ref 0.0–0.1)
Basophils Relative: 0 %
Eosinophils Absolute: 0.2 10*3/uL (ref 0.0–0.7)
Eosinophils Relative: 2 %
HCT: 33.3 % — ABNORMAL LOW (ref 36.0–46.0)
Hemoglobin: 10.8 g/dL — ABNORMAL LOW (ref 12.0–15.0)
Lymphocytes Relative: 9 %
Lymphs Abs: 1 10*3/uL (ref 0.7–4.0)
MCH: 27.9 pg (ref 26.0–34.0)
MCHC: 32.4 g/dL (ref 30.0–36.0)
MCV: 86 fL (ref 78.0–100.0)
Monocytes Absolute: 0.8 10*3/uL (ref 0.1–1.0)
Monocytes Relative: 7 %
Neutro Abs: 9.1 10*3/uL — ABNORMAL HIGH (ref 1.7–7.7)
Neutrophils Relative %: 82 %
Platelets: 429 10*3/uL — ABNORMAL HIGH (ref 150–400)
RBC: 3.87 MIL/uL (ref 3.87–5.11)
RDW: 15.6 % — ABNORMAL HIGH (ref 11.5–15.5)
WBC: 11.2 10*3/uL — ABNORMAL HIGH (ref 4.0–10.5)

## 2017-10-12 LAB — COMPREHENSIVE METABOLIC PANEL
ALT: 7 U/L — ABNORMAL LOW (ref 14–54)
AST: 11 U/L — ABNORMAL LOW (ref 15–41)
Albumin: 2.9 g/dL — ABNORMAL LOW (ref 3.5–5.0)
Alkaline Phosphatase: 67 U/L (ref 38–126)
Anion gap: 10 (ref 5–15)
BUN: 6 mg/dL (ref 6–20)
CO2: 25 mmol/L (ref 22–32)
Calcium: 9 mg/dL (ref 8.9–10.3)
Chloride: 100 mmol/L — ABNORMAL LOW (ref 101–111)
Creatinine, Ser: 0.71 mg/dL (ref 0.44–1.00)
GFR calc Af Amer: >60 mL/min (ref >60)
GFR calc non Af Amer: >60 mL/min (ref >60)
Glucose, Bld: 97 mg/dL (ref 65–99)
Potassium: 3.6 mmol/L (ref 3.5–5.1)
Sodium: 135 mmol/L (ref 135–145)
Total Bilirubin: 0.6 mg/dL (ref 0.3–1.2)
Total Protein: 7.1 g/dL (ref 6.5–8.1)

## 2017-10-12 MED ORDER — CHLORTHALIDONE 25 MG PO TABS
25.0000 mg | ORAL_TABLET | Freq: Every day | ORAL | Status: DC
  Administered 2017-10-12 – 2017-10-16 (×5): 25 mg via ORAL
  Filled 2017-10-12 (×5): qty 1

## 2017-10-12 MED ORDER — PROCHLORPERAZINE EDISYLATE 10 MG/2ML IJ SOLN
5.0000 mg | Freq: Once | INTRAMUSCULAR | Status: AC
  Administered 2017-10-12: 5 mg via INTRAVENOUS
  Filled 2017-10-12: qty 2

## 2017-10-12 MED ORDER — POTASSIUM CHLORIDE CRYS ER 20 MEQ PO TBCR
40.0000 meq | EXTENDED_RELEASE_TABLET | Freq: Once | ORAL | Status: AC
Start: 2017-10-12 — End: 2017-10-12
  Administered 2017-10-12: 40 meq via ORAL
  Filled 2017-10-12: qty 2

## 2017-10-12 NOTE — Progress Notes (Signed)
Shannon Scott is now down on 8 W.  She, amazingly, had her Port-A-Cath put in yesterday.  Interventional radiology did an incredible job.  They did both an IVC filter in the Port-A-Cath.  I still feel that she will need anticoagulation for the thromboembolic disease.  I believe that she is definitely hypercoagulable because of her malignancy.  However, with his bleeding, we cannot use anticoagulation right now.  She we will start her chemotherapy today.  She actually feels pretty well.  She is had no nausea or vomiting.  She is had no cough or shortness of breath.  She has had no obvious bleeding.  There is been no fever.  Her lab work today looks pretty good.  Her white cell count is 11.2.  Hemoglobin 10.8.  Platelet count 429,000.  Her creatinine is 0.71.  Potassium is 3.6.  I believe that she we will do okay with low-dose weekly carboplatin/Taxol.  Again, the radiation therapy will do the bulk of the work for [place].  I would like to think that we should have a very good chance of causing tumor regression and then having the tumor resected.  Her appetite is still not all that great.  I am not sure how long she will be in the hospital for her treatments.  I think the family is having difficulties trying to help with transportation for her.  Her vital signs show her blood pressure to be 174/70.  Her pulse is 93.  Temperature 98.8.  Her head and neck exam shows no oral lesions.  She has no adenopathy in the neck.  Lungs are clear.  Cardiac exam regular rate and rhythm with no murmurs, rubs or bruits.  Abdomen is soft.  She has decent bowel sounds.  There is no guarding or rebound tenderness.  She has a colostomy in the left lower quadrant.  Extremities shows no clubbing, cyanosis or edema.  She does have some swelling in the left leg.  We probably need to get a compression stocking on her left leg.  I will be out until next Wednesday.  I will see if Dr.+[name] can cover while I am gone.   [name], MD  2 Corinthians 12:9

## 2017-10-12 NOTE — Progress Notes (Signed)
  Radiation Oncology         [phone]) [phone] ________________________________  Name: Shannon Scott MRN: [id number]  Date: 10/12/2017  DOB: 1940/06/01  Simulation Verification Note    ICD-10-CM   1. Endometrial cancer ([place]) C54.1     Status: inpatient  NARRATIVE: The patient was brought to the treatment unit and placed in the planned treatment position. The clinical setup was verified. Then port films were obtained and uploaded to the radiation oncology medical record software.  The treatment beams were carefully compared against the planned radiation fields. The position location and shape of the radiation fields was reviewed. They targeted volume of tissue appears to be appropriately covered by the radiation beams. Organs at risk appear to be excluded as planned.  Based on my personal review, I approved the simulation verification. The patient's treatment will proceed as planned.  -----------------------------------  Blair Promise, PhD, MD

## 2017-10-12 NOTE — Consult Note (Signed)
[facility] Nurse ostomy consult note Stoma type/location: Patient with recent ostomy (March 2019) admitted for weakness and rectal bleeding. Seen today for routine pouch change, skin assessment and to see if patient has any questions. Stomal assessment/size: oval, functioning os at 9 o'clock. Pink, budded, moist stoma measures 1 and 1/2 inch from top to bottom and 2 and 1/2 inches from side to side. Peristomal assessment: intact, clear Treatment options for stomal/peristomal skin: skin barrier ring Output: small amount of formed brown stool at os Ostomy pouching: 1pc.flat pouch with skin barrier ring Education provided: PAtient is reassured that she is changing and emptying pouch correctly, currently changes pouch every 3-4 days.  She is independent in emptying and in changing pouch.  A spare pouch and skin barrier ring in addition to a new pattern and a sizing guide are provided for her at the bedside today. Enrolled patient in [facility] [place] program: Yes, previously.  [place] nursing team will not follow, but will remain available to this patient, the nursing and medical teams.  Please re-consult if needed. Thanks, Maudie Flakes, MSN, RN, [place], Arther Abbott  Pager# [phone]

## 2017-10-12 NOTE — Progress Notes (Signed)
PROGRESS NOTE    Shannon Scott  ZTI:[id number] DOB: [birth year] DOA: 10/03/2017 PCP: Leeroy Cha, MD   Brief Narrative:  77 y.o.femalewith medical history significant ofCADs/p CABG,endometrial cancer, A. fib with RVR,andhypothyroidism;who presents with complaints of generalized weakness and continued rectal bleeding.  She just been recently hospitalized3/21-3/29,after coming inwithcomplaints ofabdominal pain with nausea andvomiting. Patient was found to have a ill-defined heterogeneous mass involving the sigmoid colon of the pelvis with signs of possible perforationand abscess during the hospitalization patient was evaluated by general surgery, cardiology, urology, and orthopedic surgery. She underwent JJ stenting and laparoscopic diverting loopcolostomyby Dr. Romona Curls 3/24.She was initially discharged to a rehab facility,but has been home for a while. Since being hospitalized patient reports still having rectal bleeding. She will get the urge to have a bowel movement and when she goes notes clots of dark blood present. Patient going once or twice per day. She notes that she is just had generalized weakness and felt as though she may pass out. Denies having any loss of consciousness or recent falls. Her biggest complaint is continued left-sided hip pain that runs down her leg and into her groin area.Just 3 days ago,she had a scope by Dr. Collene Mares ofgastroenterology,but the mass was not able to be reached. Patient notes her weakness symptoms were sosevere today to the point which she was unable to get around and she came in for further evaluation.   Hospital course complicated by acute GI related blood loss secondary to cancer of which GI is unable to do anything.  Patient recently required transfusion.  Further evaluation would reveal DVTs of which patient was sent to get an IVC filter placed.  Oncology consulted after biopsy results available  and plan is for chemotherapy.     Assessment & Plan:   Principal Problem:   Rectal bleeding Active Problems:   Hypothyroid   Essential hypertension   Normocytic anemia   Left hip pain   Generalized weakness   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 (HCC)   Goals of care, counseling/discussion   Abdominal mass   Deep vein thrombosis (DVT) of left lower extremity (HCC)   Malignant neoplasm of uterus (HCC)   Pelvic mass   Acute blood loss anemia  1 pelvic mass/rectal bleeding Patient had presented with weakness rectal bleeding.  CT scan from 08/18/2017 showed sigmoid colon of pelvis with signs of possible perforation and abscess.  Hemoglobin is stable.  Patient status post diabetic loop colostomy 08/21/2017.  Patient underwent recent scope by Dr. Collene Mares and unable to obtain tissue biopsy.  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  Patient being followed by oncology and patient transferred to the third floor to begin chemotherapy.  Patient status post 2 units packed red blood cells 10/10/2017.  Hemoglobin currently stable at 10.8.  Per GI in terms of bleeding.  Will transfuse as needed.  Per oncology.  2.  Left lower extremity DVT Likely secondary to malignancy.  Due to recent GI bleed patient not a candidate for anticoagulation at this time.  Status post IVC filter placement 10/11/2017 per interventional radiology.  Oncology following.  3.  Generalized weakness/failure to thrive Likely secondary to malignancy.  Supportive care.  4.  Acute blood loss anemia Prior physician discussed with Dr. Collene Mares GI who felt there was nothing to do from a GI standpoint.  Patient status post IV iron 10/10/2017.  Patient status  post transfusion of 2 units packed red blood cells 10/10/2017.  Continue PPI.  Hemoglobin currently stable at 10.8.  Follow H&H.  5.  Left  hip pain Likely secondary to pelvic mass and DVT.  Tramadol as needed.  6.  Hypertension Blood pressure elevated.  Patient currently on losartan.  We will add chlorthalidone 25 mg daily as noted.  Continue Cardizem.  Follow.  7.  Hypothyroidism TSH of 3.917.  Continue Synthroid.  8.  Hypokalemia Replete.  9.  Paroxysmal atrial fibrillation CHA2DS2VASC score = 4 Continue potassium for rate control.  Patient was not placed on anticoagulation initially secondary to recent ostomy.  Patient now with rectal bleeding and likely not a candidate for anticoagulation at this time.  Outpatient follow-up with cardiology.  10.  Coronary artery disease status post CABG Stable.  Continue cardiac medications.  11.  Hx of endometrial cancer Per oncology.   DVT prophylaxis: SCDs Code Status: Full Family Communication: Updated patient.  No family at bedside. Disposition Plan: Likely home with home health when okay with oncology.   Consultants:   Oncology: Dr. Marin Olp 10/09/2017  Radiation oncology Dr. Dwana Curd 10/12/2017  Procedures:   CT abdomen and pelvis 10/03/2017  CT-guided biopsy of left pelvic mass per interventional radiology Dr. Laurence Ferrari  Bilateral lower extremity edema Dopplers 10/10/2017  Ultrasound-guided placement of subcutaneous port.  Interventional radiology, Dr. Anselm Pancoast 10/11/2017  Transfused 2 units packed red blood cells 10/10/2017.  IVC filter placement per Dr. Anselm Pancoast 10/11/2017  Antimicrobials:   None   Subjective: Patient laying in bed.  Denies any chest pain no shortness of breath.  No abdominal pain.  States feels a little bit better on admission.  Objective: Vitals:   10/11/17 2056 10/12/17 0450 10/12/17 0500 10/12/17 1337  BP: (!) 160/66 (!) 174/70  (!) 152/57  Pulse: 81 93  80  Resp: 18 20  20   Temp: 98.5 F (36.9 C) 98.8 F (37.1 C)  98.1 F (36.7 C)  TempSrc:  Oral  Oral  SpO2: 94% 90%  94%  Weight:   64.7 kg (142 lb 10.2 oz)   Height:         Intake/Output Summary (Last 24 hours) at 10/12/2017 1612 Last data filed at 10/12/2017 0900 Gross per 24 hour  Intake 1378.75 ml  Output -  Net 1378.75 ml   Filed Weights   10/10/17 0500 10/11/17 0514 10/12/17 0500  Weight: 67.3 kg (148 lb 5.9 oz) 63.8 kg (140 lb 10.5 oz) 64.7 kg (142 lb 10.2 oz)    Examination:  General exam: Appears calm and comfortable  Respiratory system: Clear to auscultation anterior lung fields.  No wheezes, no crackles, no rhonchi.Marland Kitchen Respiratory effort normal. Cardiovascular system: S1 & S2 heard, RRR with grade 3 out of 6 systolic ejection murmur. No pedal edema. Gastrointestinal system: Abdomen is nondistended, soft and nontender. No organomegaly or masses felt. Normal bowel sounds heard.  Colostomy intact with minimal stool and some gas. Central nervous system: Alert and oriented. No focal neurological deficits. Extremities: Symmetric 5 x 5 power. Skin: No rashes, lesions or ulcers Psychiatry: Judgement and insight appear normal. Mood & affect appropriate.     Data Reviewed: I have personally reviewed following labs and imaging studies  CBC: Recent Labs  Lab 10/09/17 1039 10/10/17 0613 10/11/17 0541 10/12/17 0317  WBC 10.9* 8.4 8.3 11.2*  NEUTROABS 9.2* 6.6 6.2 9.1*  HGB 8.4* 8.0* 10.9* 10.8*  HCT 26.2* 25.3* 32.9* 33.3*  MCV 84.5 84.9 85.0 86.0  PLT 415* 405*  454* 250*   Basic Metabolic Panel: Recent Labs  Lab 10/05/17 2355 10/09/17 1039 10/10/17 0613 10/11/17 0541 10/12/17 0317  NA 135 139 139 140 135  K 4.1 3.8 3.7 3.5 3.6  CL 105 105 104 101 100*  CO2 23 24 25 26 25   GLUCOSE 132* 114* 96 91 97  BUN 7 6 6 7 6   CREATININE 0.63 0.74 0.68 0.71 0.71  CALCIUM 8.7* 9.2 9.1 9.5 9.0   GFR: Estimated Creatinine Clearance: 52.4 mL/min (by C-G formula based on SCr of 0.71 mg/dL). Liver Function Tests: Recent Labs  Lab 10/09/17 1039 10/10/17 0613 10/11/17 0541 10/12/17 0317  AST 18 11* 13* 11*  ALT 7* 9* 9* 7*  ALKPHOS 66 61  67 67  BILITOT 0.5 0.3 0.8 0.6  PROT 6.9 6.7 7.2 7.1  ALBUMIN 2.8* 2.6* 2.9* 2.9*   No results for input(s): LIPASE, AMYLASE in the last 168 hours. No results for input(s): AMMONIA in the last 168 hours. Coagulation Profile: Recent Labs  Lab 10/11/17 0541  INR 1.05   Cardiac Enzymes: No results for input(s): CKTOTAL, CKMB, CKMBINDEX, TROPONINI in the last 168 hours. BNP (last 3 results) No results for input(s): PROBNP in the last 8760 hours. HbA1C: No results for input(s): HGBA1C in the last 72 hours. CBG: No results for input(s): GLUCAP in the last 168 hours. Lipid Profile: No results for input(s): CHOL, HDL, LDLCALC, TRIG, CHOLHDL, LDLDIRECT in the last 72 hours. Thyroid Function Tests: No results for input(s): TSH, T4TOTAL, FREET4, T3FREE, THYROIDAB in the last 72 hours. Anemia Panel: No results for input(s): VITAMINB12, FOLATE, FERRITIN, TIBC, IRON, RETICCTPCT in the last 72 hours. Sepsis Labs: No results for input(s): PROCALCITON, LATICACIDVEN in the last 168 hours.  No results found for this or any previous visit (from the past 240 hour(s)).       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 Patient has a DVT in the left lower extremity. Patient has recent GI bleeding and she is not a candidate for anticoagulation at this time. Plan for placement of an IVC filter. EXAM: IVC FILTER PLACEMENT; IVC VENOGRAM; ULTRASOUND FOR VASCULAR ACCESS Physician: Stephan Minister. Anselm Pancoast, MD MEDICATIONS: None. ANESTHESIA/SEDATION: 1 mg Versed. This procedure was performed immediately following the Port-A-Cath placement. The patient was continuously monitored during the procedure by the interventional radiology nurse under my direct supervision. CONTRAST:  80 mL Isovue-300 FLUOROSCOPY TIME:  Fluoroscopy Time: 1 minutes 30 seconds (28 mGy). COMPLICATIONS: None immediate. PROCEDURE: The  procedure was explained to the patient. The risks and benefits of the procedure were discussed and the patient's questions were addressed. Informed consent was obtained from the patient. Ultrasound demonstrated a patent right common femoral vein. Ultrasound images were obtained for documentation. The right groin was prepped and draped in a sterile fashion. Maximal barrier sterile technique was utilized including caps, mask, sterile gowns, sterile gloves, sterile drape, hand hygiene and skin antiseptic. The skin was anesthetized with 1% lidocaine. A 21 gauge needle was directed into the vein with ultrasound guidance and a micropuncture dilator set was placed. A wire was advanced into the IVC. The filter sheath was advanced over the wire into the IVC. An IVC venogram was performed. Fluoroscopic images were obtained for documentation. A Bard Denali filter was deployed below the lowest renal vein. A follow-up venogram was performed and the vascular sheath was removed with manual compression. FINDINGS: IVC was patent.  Bilateral renal veins were identified. The filter was deployed below the lowest renal vein. Follow-up venogram confirmed placement within the IVC and below the renal veins. IMPRESSION: Successful placement of a retrievable IVC filter. PLAN: Due to patient related comorbidities and/or clinical necessity, this IVC filter should be considered a permanent device. This patient will not be actively followed for future filter retrieval. Electronically Signed   By: Markus Daft M.D.   On: 10/11/2017 14:55   Ir US Guide Vasc Access Right  Addendum Date: 10/11/2017   ADDENDUM REPORT: 10/11/2017 17:32 ADDENDUM: The port was secured to the chest wall with suture. Electronically Signed   By: Markus Daft M.D.   On: 10/11/2017 17:32   Result Date: 10/11/2017 INDICATION: 77 year old with history of uterine cancer. Recently biopsied pelvic mass demonstrates a poorly differentiated carcinoma. Patient needs Port-A-Cath for  chemotherapy. EXAM: FLUOROSCOPIC AND ULTRASOUND GUIDED PLACEMENT OF A SUBCUTANEOUS PORT COMPARISON:  None. MEDICATIONS: Ancef 2 g; The antibiotic was administered within an appropriate time interval prior to skin puncture. ANESTHESIA/SEDATION: Versed 2.0 mg IV; Fentanyl 100 mcg IV; Moderate Sedation Time:  35 minutes The patient was continuously monitored during the procedure by the interventional radiology nurse under my direct supervision. FLUOROSCOPY TIME:  54 seconds, 3 mGy COMPLICATIONS: None immediate. PROCEDURE: The procedure, risks, benefits, and alternatives were explained to the patient. Questions regarding the procedure were encouraged and answered. The patient understands and consents to the procedure. Patient was placed supine on the interventional table. Ultrasound confirmed a patent right internal jugular vein. The right chest and neck were cleaned with a skin antiseptic and a sterile drape was placed. Maximal barrier sterile technique was utilized including caps, mask, sterile gowns, sterile gloves, sterile drape, hand hygiene and skin antiseptic. The right neck was anesthetized with 1% lidocaine. Small incision was made in the right neck with a blade. Micropuncture set was placed in the right internal jugular vein with ultrasound guidance. The micropuncture wire was used for measurement purposes. The right chest was anesthetized with 1% lidocaine with epinephrine. #15 blade was used to make an incision and a subcutaneous port pocket was formed. [place] was assembled. Subcutaneous tunnel was formed with a stiff tunneling device. The port catheter was brought through the subcutaneous tunnel. The port was placed in the subcutaneous pocket. The micropuncture set was exchanged for a peel-away sheath. The catheter was placed through the peel-away sheath and the tip was positioned near the superior cavoatrial junction. Catheter placement was confirmed with fluoroscopy. The port was accessed and  flushed with saline. The port pocket was closed using two layers of absorbable sutures and Dermabond. The vein skin site was closed using a single layer of absorbable suture and Dermabond. Sterile dressings were applied. Patient tolerated the procedure well without an immediate complication. Ultrasound confirmed a patent right internal jugular vein. Ultrasound was used for vascular guidance. Ultrasound image was saved for documentation. Fluoroscopic images were taken and saved for this procedure. IMPRESSION: Placement of a subcutaneous port device. This is a CT injectable Port-A-Cath. Electronically Signed: By: Markus Daft M.D. On: 10/11/2017 14:49   Ir Fluoro Guide Port Insertion Right  Addendum Date: 10/11/2017   ADDENDUM REPORT: 10/11/2017 17:32 ADDENDUM: The port was secured to the chest wall with suture. Electronically Signed   By: Markus Daft M.D.   On: 10/11/2017 17:32   Result Date: 10/11/2017 INDICATION: 77 year old with history of uterine cancer. Recently biopsied pelvic mass demonstrates a poorly differentiated carcinoma. Patient needs Port-A-Cath for  chemotherapy. EXAM: FLUOROSCOPIC AND ULTRASOUND GUIDED PLACEMENT OF A SUBCUTANEOUS PORT COMPARISON:  None. MEDICATIONS: Ancef 2 g; The antibiotic was administered within an appropriate time interval prior to skin puncture. ANESTHESIA/SEDATION: Versed 2.0 mg IV; Fentanyl 100 mcg IV; Moderate Sedation Time:  35 minutes The patient was continuously monitored during the procedure by the interventional radiology nurse under my direct supervision. FLUOROSCOPY TIME:  54 seconds, 3 mGy COMPLICATIONS: None immediate. PROCEDURE: The procedure, risks, benefits, and alternatives were explained to the patient. Questions regarding the procedure were encouraged and answered. The patient understands and consents to the procedure. Patient was placed supine on the interventional table. Ultrasound confirmed a patent right internal jugular vein. The right chest and neck  were cleaned with a skin antiseptic and a sterile drape was placed. Maximal barrier sterile technique was utilized including caps, mask, sterile gowns, sterile gloves, sterile drape, hand hygiene and skin antiseptic. The right neck was anesthetized with 1% lidocaine. Small incision was made in the right neck with a blade. Micropuncture set was placed in the right internal jugular vein with ultrasound guidance. The micropuncture wire was used for measurement purposes. The right chest was anesthetized with 1% lidocaine with epinephrine. #15 blade was used to make an incision and a subcutaneous port pocket was formed. [place] was assembled. Subcutaneous tunnel was formed with a stiff tunneling device. The port catheter was brought through the subcutaneous tunnel. The port was placed in the subcutaneous pocket. The micropuncture set was exchanged for a peel-away sheath. The catheter was placed through the peel-away sheath and the tip was positioned near the superior cavoatrial junction. Catheter placement was confirmed with fluoroscopy. The port was accessed and flushed with saline. The port pocket was closed using two layers of absorbable sutures and Dermabond. The vein skin site was closed using a single layer of absorbable suture and Dermabond. Sterile dressings were applied. Patient tolerated the procedure well without an immediate complication. Ultrasound confirmed a patent right internal jugular vein. Ultrasound was used for vascular guidance. Ultrasound image was saved for documentation. Fluoroscopic images were taken and saved for this procedure. IMPRESSION: Placement of a subcutaneous port device. This is a CT injectable Port-A-Cath. Electronically Signed: By: Markus Daft M.D. On: 10/11/2017 14:49        Scheduled Meds: . chlorthalidone  25 mg Oral Daily  . diclofenac sodium  2 g Topical QID  . diltiazem  300 mg Oral Daily  . dronabinol  2.5 mg Oral BID AC  . lactose free nutrition  237  mL Oral BID BM  . levothyroxine  50 mcg Oral QAC breakfast  . losartan  100 mg Oral Daily  . pantoprazole  40 mg Oral BID   Continuous Infusions: . sodium chloride 75 mL/hr at 10/11/17 1729     LOS: 5 days    Time spent: 35 minutes    Irine Seal, MD [facility] Pager [phone]  If 7PM-7AM, please contact night-coverage [email or web address] Password [facility] 10/12/2017, 4:12 PM

## 2017-10-12 NOTE — Progress Notes (Signed)
[facility]  Patient Status: Active (receiving services up to time of hospitalization)  [facility] is providing the following services: RN, PT and OT  If patient discharges after hours, please call [phone].   Shannon Scott 10/12/2017, 3:32 PM

## 2017-10-13 ENCOUNTER — Ambulatory Visit: Payer: Medicare Other

## 2017-10-13 ENCOUNTER — Ambulatory Visit
Admit: 2017-10-13 | Discharge: 2017-10-13 | Disposition: A | Discharge status: Home / Self Care | Payer: Medicare Other | Attending: Radiation Oncology | Admitting: Radiation Oncology

## 2017-10-13 DIAGNOSIS — Z923 Personal history of irradiation: Secondary | ICD-10-CM

## 2017-10-13 DIAGNOSIS — K5900 Constipation, unspecified: Secondary | ICD-10-CM

## 2017-10-13 DIAGNOSIS — I4891 Unspecified atrial fibrillation: Secondary | ICD-10-CM

## 2017-10-13 DIAGNOSIS — C784 Secondary malignant neoplasm of small intestine: Secondary | ICD-10-CM

## 2017-10-13 LAB — CBC WITH DIFFERENTIAL/PLATELET
Basophils Absolute: 0 10*3/uL (ref 0.0–0.1)
Basophils Relative: 0 %
Eosinophils Absolute: 0.1 10*3/uL (ref 0.0–0.7)
Eosinophils Relative: 1 %
HCT: 32.2 % — ABNORMAL LOW (ref 36.0–46.0)
Hemoglobin: 10.4 g/dL — ABNORMAL LOW (ref 12.0–15.0)
Lymphocytes Relative: 7 %
Lymphs Abs: 0.8 10*3/uL (ref 0.7–4.0)
MCH: 28.1 pg (ref 26.0–34.0)
MCHC: 32.3 g/dL (ref 30.0–36.0)
MCV: 87 fL (ref 78.0–100.0)
Monocytes Absolute: 0.8 10*3/uL (ref 0.1–1.0)
Monocytes Relative: 7 %
Neutro Abs: 9.5 10*3/uL — ABNORMAL HIGH (ref 1.7–7.7)
Neutrophils Relative %: 85 %
Platelets: 398 10*3/uL (ref 150–400)
RBC: 3.7 MIL/uL — ABNORMAL LOW (ref 3.87–5.11)
RDW: 15.6 % — ABNORMAL HIGH (ref 11.5–15.5)
WBC: 11.2 10*3/uL — ABNORMAL HIGH (ref 4.0–10.5)

## 2017-10-13 LAB — BASIC METABOLIC PANEL
Anion gap: 11 (ref 5–15)
BUN: 6 mg/dL (ref 6–20)
CO2: 25 mmol/L (ref 22–32)
Calcium: 9.1 mg/dL (ref 8.9–10.3)
Chloride: 103 mmol/L (ref 101–111)
Creatinine, Ser: 0.66 mg/dL (ref 0.44–1.00)
GFR calc Af Amer: >60 mL/min (ref >60)
GFR calc non Af Amer: >60 mL/min (ref >60)
Glucose, Bld: 109 mg/dL — ABNORMAL HIGH (ref 65–99)
Potassium: 3.6 mmol/L (ref 3.5–5.1)
Sodium: 139 mmol/L (ref 135–145)

## 2017-10-13 LAB — MAGNESIUM: Magnesium: 1.7 mg/dL (ref 1.7–2.4)

## 2017-10-13 MED ORDER — POLYETHYLENE GLYCOL 3350 17 G PO PACK
17.0000 g | PACK | Freq: Every day | ORAL | Status: DC
  Administered 2017-10-13: 17 g via ORAL
  Filled 2017-10-13: qty 1

## 2017-10-13 MED ORDER — SODIUM CHLORIDE 0.9 % IV SOLN
80.0000 mg/m2 | Freq: Once | INTRAVENOUS | Status: AC
  Administered 2017-10-13: 132 mg via INTRAVENOUS
  Filled 2017-10-13: qty 22

## 2017-10-13 MED ORDER — SODIUM CHLORIDE 0.9 % IV SOLN: Freq: Once | INTRAVENOUS | Status: AC

## 2017-10-13 MED ORDER — DEXAMETHASONE SODIUM PHOSPHATE 100 MG/10ML IJ SOLN
20.0000 mg | Freq: Once | INTRAMUSCULAR | Status: AC
  Administered 2017-10-13: 20 mg via INTRAVENOUS
  Filled 2017-10-13: qty 2

## 2017-10-13 MED ORDER — METHYLPREDNISOLONE SODIUM SUCC 125 MG IJ SOLR: 125.0000 mg | Freq: Once | INTRAMUSCULAR | Status: DC | PRN

## 2017-10-13 MED ORDER — FAMOTIDINE IN NACL 20-0.9 MG/50ML-% IV SOLN: 20.0000 mg | Freq: Once | INTRAVENOUS | Status: DC | PRN

## 2017-10-13 MED ORDER — BOOST / RESOURCE BREEZE PO LIQD CUSTOM: 1.0000 | Freq: Two times a day (BID) | ORAL | Status: DC

## 2017-10-13 MED ORDER — PALONOSETRON HCL INJECTION 0.25 MG/5ML
0.2500 mg | Freq: Once | INTRAVENOUS | Status: AC
  Administered 2017-10-13: 0.25 mg via INTRAVENOUS
  Filled 2017-10-13: qty 5

## 2017-10-13 MED ORDER — DIPHENHYDRAMINE HCL 50 MG/ML IJ SOLN: 50.0000 mg | Freq: Once | INTRAMUSCULAR | Status: DC | PRN

## 2017-10-13 MED ORDER — SODIUM CHLORIDE 0.9 % IV SOLN
148.0000 mg | Freq: Once | INTRAVENOUS | Status: AC
  Administered 2017-10-13: 150 mg via INTRAVENOUS
  Filled 2017-10-13: qty 15

## 2017-10-13 MED ORDER — SODIUM CHLORIDE 0.9% FLUSH: 10.0000 mL | INTRAVENOUS | Status: DC | PRN

## 2017-10-13 MED ORDER — ALTEPLASE 2 MG IJ SOLR: 2.0000 mg | Freq: Once | INTRAMUSCULAR | Status: DC | PRN

## 2017-10-13 MED ORDER — SODIUM CHLORIDE 0.9% FLUSH: 3.0000 mL | INTRAVENOUS | Status: DC | PRN

## 2017-10-13 MED ORDER — ALBUTEROL SULFATE (2.5 MG/3ML) 0.083% IN NEBU: 2.5000 mg | INHALATION_SOLUTION | Freq: Once | RESPIRATORY_TRACT | Status: DC | PRN

## 2017-10-13 MED ORDER — SODIUM CHLORIDE 0.9 % IV SOLN: Freq: Once | INTRAVENOUS | Status: DC | PRN

## 2017-10-13 MED ORDER — COLD PACK MISC ONCOLOGY
1.0000 | Freq: Once | Status: AC | PRN
  Filled 2017-10-13: qty 1

## 2017-10-13 MED ORDER — EPINEPHRINE PF 1 MG/ML IJ SOLN: 0.5000 mg | Freq: Once | INTRAMUSCULAR | Status: DC | PRN

## 2017-10-13 MED ORDER — HEPARIN SOD (PORK) LOCK FLUSH 100 UNIT/ML IV SOLN: 500.0000 [IU] | Freq: Once | INTRAVENOUS | Status: DC | PRN

## 2017-10-13 MED ORDER — FAMOTIDINE IN NACL 20-0.9 MG/50ML-% IV SOLN
20.0000 mg | Freq: Once | INTRAVENOUS | Status: AC
  Administered 2017-10-13: 20 mg via INTRAVENOUS
  Filled 2017-10-13: qty 50

## 2017-10-13 MED ORDER — DIPHENHYDRAMINE HCL 50 MG/ML IJ SOLN
50.0000 mg | Freq: Once | INTRAMUSCULAR | Status: AC
Start: 2017-10-13 — End: 2017-10-13
  Administered 2017-10-13: 50 mg via INTRAVENOUS
  Filled 2017-10-13: qty 1

## 2017-10-13 MED ORDER — BOOST PLUS PO LIQD
237.0000 mL | Freq: Three times a day (TID) | ORAL | Status: DC
  Administered 2017-10-13 – 2017-10-16 (×9): 237 mL via ORAL
  Filled 2017-10-13 (×10): qty 237

## 2017-10-13 MED ORDER — EPINEPHRINE PF 1 MG/10ML IJ SOSY: 0.2500 mg | PREFILLED_SYRINGE | Freq: Once | INTRAMUSCULAR | Status: DC | PRN

## 2017-10-13 MED ORDER — SENNOSIDES-DOCUSATE SODIUM 8.6-50 MG PO TABS
1.0000 | ORAL_TABLET | Freq: Two times a day (BID) | ORAL | Status: DC
  Administered 2017-10-13 – 2017-10-14 (×2): 1 via ORAL
  Filled 2017-10-13 (×4): qty 1

## 2017-10-13 MED ORDER — HEPARIN SOD (PORK) LOCK FLUSH 100 UNIT/ML IV SOLN: 250.0000 [IU] | Freq: Once | INTRAVENOUS | Status: DC | PRN

## 2017-10-13 MED ORDER — DIPHENHYDRAMINE HCL 50 MG/ML IJ SOLN: 25.0000 mg | Freq: Once | INTRAMUSCULAR | Status: DC | PRN

## 2017-10-13 MED ORDER — MAGNESIUM SULFATE 4 GM/100ML IV SOLN
4.0000 g | Freq: Once | INTRAVENOUS | Status: AC
  Administered 2017-10-13: 4 g via INTRAVENOUS
  Filled 2017-10-13: qty 100

## 2017-10-13 NOTE — Progress Notes (Signed)
PROGRESS NOTE    Shannon Scott  JQB:[id number] DOB: May 05, 1941 DOA: 10/03/2017 PCP: Leeroy Cha, MD   Brief Narrative:  77 y.o.femalewith medical history significant ofCADs/p CABG,endometrial cancer, A. fib with RVR,andhypothyroidism;who presents with complaints of generalized weakness and continued rectal bleeding.  She just been recently hospitalized3/21-3/29,after coming inwithcomplaints ofabdominal pain with nausea andvomiting. Patient was found to have a ill-defined heterogeneous mass involving the sigmoid colon of the pelvis with signs of possible perforationand abscess during the hospitalization patient was evaluated by general surgery, cardiology, urology, and orthopedic surgery. She underwent JJ stenting and laparoscopic diverting loopcolostomyby Dr. Romona Curls 3/24.She was initially discharged to a rehab facility,but has been home for a while. Since being hospitalized patient reports still having rectal bleeding. She will get the urge to have a bowel movement and when she goes notes clots of dark blood present. Patient going once or twice per day. She notes that she is just had generalized weakness and felt as though she may pass out. Denies having any loss of consciousness or recent falls. Her biggest complaint is continued left-sided hip pain that runs down her leg and into her groin area.Just 3 days ago,she had a scope by Dr. Collene Mares ofgastroenterology,but the mass was not able to be reached. Patient notes her weakness symptoms were sosevere today to the point which she was unable to get around and she came in for further evaluation.   Hospital course complicated by acute GI related blood loss secondary to cancer of which GI is unable to do anything.  Patient recently required transfusion.  Further evaluation would reveal DVTs of which patient was sent to get an IVC filter placed.  Oncology consulted after biopsy results available  and plan is for chemotherapy.     Assessment & Plan:   Principal Problem:   Rectal bleeding Active Problems:   Hypothyroid   Essential hypertension   Normocytic anemia   Left hip pain   Generalized weakness   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 (HCC)   Goals of care, counseling/discussion   Abdominal mass   Deep vein thrombosis (DVT) of left lower extremity (HCC)   Malignant neoplasm of uterus (HCC)   Pelvic mass   Acute blood loss anemia  1 pelvic mass with involvement of sigmoid colon biopsy 10/05/2017 poorly differentiated carcinoma consistent with GYN primary/metastatic uterine cancer/ rectal bleeding Patient had presented with weakness rectal bleeding.  CT scan from 08/18/2017 showed sigmoid colon of pelvis with signs of possible perforation and abscess.  Hemoglobin is stable.  Patient status post diabetic loop colostomy 08/21/2017.  Patient underwent recent scope by Dr. Collene Mares and unable to obtain tissue biopsy.  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  Patient being followed by oncology and patient transferred to the third floor to begin chemotherapy today..  Patient status post 2 units packed red blood cells 10/10/2017.  Hemoglobin currently stable at 10.4.  Nothing further to add per GI for bleeding.  Transfuse as needed.  Per oncology.   2.  Left lower extremity DVT Likely secondary to malignancy.  Due to recent GI bleed patient not a candidate for anticoagulation at this time.  Status post IVC filter placement 10/11/2017 per interventional radiology.  Oncology following.  3.  Generalized weakness/failure to thrive Likely secondary to malignancy.  Supportive care.  4.  Acute blood loss anemia Prior physician discussed with Dr. Collene Mares GI who  felt there was nothing to do from a GI standpoint.  Patient status post IV iron 10/10/2017.   Patient status post transfusion of 2 units packed red blood cells 10/10/2017.  Hemoglobin currently stable at 10.4.  Continue PPI.   5.  Left hip pain Likely secondary to pelvic mass and DVT.  Tramadol as needed.  6.  Hypertension Improvement with blood pressure with addition of chlorthalidone.  Continue losartan and Cardizem.  Follow.  B  7.  Hypothyroidism TSH of 3.917.  Continue Synthroid.  8.  Hypokalemia Replete.  Keep potassium greater than 4.  Keep magnesium greater than 2.  9.  Paroxysmal atrial fibrillation CHA2DS2VASC score = 4 Currently rate controlled.  Patient was not placed on anticoagulation initially secondary to recent ostomy.  Patient now with rectal bleeding and likely not a candidate for anticoagulation at this time.  Outpatient follow-up with cardiology.  10.  Coronary artery disease status post CABG Stable.  Continue cardiac medications.  11.  Hx of endometrial cancer Per oncology.   DVT prophylaxis: SCDs Code Status: Full Family Communication: Updated patient and son at bedside. Disposition Plan: Likely home with home health when okay with oncology.   Consultants:   Oncology: Dr. Marin Olp 10/09/2017  Radiation oncology Dr. Dwana Curd 10/12/2017  Procedures:   CT abdomen and pelvis 10/03/2017  CT-guided biopsy of left pelvic mass per interventional radiology Dr. Laurence Ferrari  Bilateral lower extremity edema Dopplers 10/10/2017  Ultrasound-guided placement of subcutaneous port.  Interventional radiology, Dr. Anselm Pancoast 10/11/2017  Transfused 2 units packed red blood cells 10/10/2017.  IVC filter placement per Dr. Anselm Pancoast 10/11/2017  Antimicrobials:   None   Subjective: Patient in bed. No SOB, no CP. Feeling better. Patient denies any nausea and emesis. Hard small stool noted in colostomy.   Objective: Vitals:   10/12/17 0500 10/12/17 1337 10/12/17 2019 10/13/17 0537  BP:  (!) 152/57 (!) 157/53 (!) 153/55  Pulse:  80 83 77  Resp:  20 15 16   Temp:  98.1 F  (36.7 C) 98.8 F (37.1 C) 98.5 F (36.9 C)  TempSrc:  Oral Oral Oral  SpO2:  94% 94% 93%  Weight: 64.7 kg (142 lb 10.2 oz)     Height:        Intake/Output Summary (Last 24 hours) at 10/13/2017 1252 Last data filed at 10/13/2017 0322 Gross per 24 hour  Intake 1582.5 ml  Output -  Net 1582.5 ml   Filed Weights   10/10/17 0500 10/11/17 0514 10/12/17 0500  Weight: 67.3 kg (148 lb 5.9 oz) 63.8 kg (140 lb 10.5 oz) 64.7 kg (142 lb 10.2 oz)    Examination:  General exam: NAD Respiratory system: CTAB. No wheezing, no crackles, no rhonchi. Respiratory effort normal. Cardiovascular system: RRR with 3/6 SEM. No JVD, no LE edema.  Gastrointestinal system: Abdomen is soft/NT/ND/+BS. Colostomy intact with minimal stool and some gas. Central nervous system: Alert and oriented. No focal neurological deficits. Extremities: Symmetric 5 x 5 power. Skin: No rashes, lesions or ulcers Psychiatry: Judgement and insight appear normal. Mood & affect appropriate.     Data Reviewed: I have personally reviewed following labs and imaging studies  CBC: Recent Labs  Lab 10/09/17 1039 10/10/17 0613 10/11/17 0541 10/12/17 0317 10/13/17 0535  WBC 10.9* 8.4 8.3 11.2* 11.2*  NEUTROABS 9.2* 6.6 6.2 9.1* 9.5*  HGB 8.4* 8.0* 10.9* 10.8* 10.4*  HCT 26.2* 25.3* 32.9* 33.3* 32.2*  MCV 84.5 84.9 85.0 86.0 87.0  PLT 415* 405* 454* 429* 398   Basic  Metabolic Panel: Recent Labs  Lab 10/09/17 1039 10/10/17 0613 10/11/17 0541 10/12/17 0317 10/13/17 0535  NA 139 139 140 135 139  K 3.8 3.7 3.5 3.6 3.6  CL 105 104 101 100* 103  CO2 24 25 26 25 25   GLUCOSE 114* 96 91 97 109*  BUN 6 6 7 6 6   CREATININE 0.74 0.68 0.71 0.71 0.66  CALCIUM 9.2 9.1 9.5 9.0 9.1  MG  --   --   --   --  1.7   GFR: Estimated Creatinine Clearance: 52.4 mL/min (by C-G formula based on SCr of 0.66 mg/dL). Liver Function Tests: Recent Labs  Lab 10/09/17 1039 10/10/17 0613 10/11/17 0541 10/12/17 0317  AST 18 11* 13* 11*    ALT 7* 9* 9* 7*  ALKPHOS 66 61 67 67  BILITOT 0.5 0.3 0.8 0.6  PROT 6.9 6.7 7.2 7.1  ALBUMIN 2.8* 2.6* 2.9* 2.9*   No results for input(s): LIPASE, AMYLASE in the last 168 hours. No results for input(s): AMMONIA in the last 168 hours. Coagulation Profile: Recent Labs  Lab 10/11/17 0541  INR 1.05   Cardiac Enzymes: No results for input(s): CKTOTAL, CKMB, CKMBINDEX, TROPONINI in the last 168 hours. BNP (last 3 results) No results for input(s): PROBNP in the last 8760 hours. HbA1C: No results for input(s): HGBA1C in the last 72 hours. CBG: No results for input(s): GLUCAP in the last 168 hours. Lipid Profile: No results for input(s): CHOL, HDL, LDLCALC, TRIG, CHOLHDL, LDLDIRECT in the last 72 hours. Thyroid Function Tests: No results for input(s): TSH, T4TOTAL, FREET4, T3FREE, THYROIDAB in the last 72 hours. Anemia Panel: No results for input(s): VITAMINB12, FOLATE, FERRITIN, TIBC, IRON, RETICCTPCT in the last 72 hours. Sepsis Labs: No results for input(s): PROCALCITON, LATICACIDVEN in the last 168 hours.  No results found for this or any previous visit (from the past 240 hour(s)).       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 Patient has a DVT in the left lower extremity. Patient has recent GI bleeding and she is not a candidate for anticoagulation at this time. Plan for placement of an IVC filter. EXAM: IVC FILTER PLACEMENT; IVC VENOGRAM; ULTRASOUND FOR VASCULAR ACCESS Physician: Stephan Minister. Anselm Pancoast, MD MEDICATIONS: None. ANESTHESIA/SEDATION: 1 mg Versed. This procedure was performed immediately following the Port-A-Cath placement. The patient was continuously monitored during the procedure by the interventional radiology nurse under my direct supervision. CONTRAST:  80 mL Isovue-300 FLUOROSCOPY TIME:  Fluoroscopy Time: 1 minutes 30 seconds (28 mGy).  COMPLICATIONS: None immediate. PROCEDURE: The procedure was explained to the patient. The risks and benefits of the procedure were discussed and the patient's questions were addressed. Informed consent was obtained from the patient. Ultrasound demonstrated a patent right common femoral vein. Ultrasound images were obtained for documentation. The right groin was prepped and draped in a sterile fashion. Maximal barrier sterile technique was utilized including caps, mask, sterile gowns, sterile gloves, sterile drape, hand hygiene and skin antiseptic. The skin was anesthetized with 1% lidocaine. A 21 gauge needle was directed into the vein with ultrasound guidance and a micropuncture dilator set was placed. A wire was advanced into the IVC. The filter sheath was advanced over the wire into the IVC. An IVC venogram was performed. Fluoroscopic images were obtained for documentation. A Bard Denali filter was deployed below the lowest renal vein. A follow-up venogram was performed and the  vascular sheath was removed with manual compression. FINDINGS: IVC was patent. Bilateral renal veins were identified. The filter was deployed below the lowest renal vein. Follow-up venogram confirmed placement within the IVC and below the renal veins. IMPRESSION: Successful placement of a retrievable IVC filter. PLAN: Due to patient related comorbidities and/or clinical necessity, this IVC filter should be considered a permanent device. This patient will not be actively followed for future filter retrieval. Electronically Signed   By: Markus Daft M.D.   On: 10/11/2017 14:55   Ir US Guide Vasc Access Right  Addendum Date: 10/11/2017   ADDENDUM REPORT: 10/11/2017 17:32 ADDENDUM: The port was secured to the chest wall with suture. Electronically Signed   By: Markus Daft M.D.   On: 10/11/2017 17:32   Result Date: 10/11/2017 INDICATION: 77 year old with history of uterine cancer. Recently biopsied pelvic mass demonstrates a poorly  differentiated carcinoma. Patient needs Port-A-Cath for chemotherapy. EXAM: FLUOROSCOPIC AND ULTRASOUND GUIDED PLACEMENT OF A SUBCUTANEOUS PORT COMPARISON:  None. MEDICATIONS: Ancef 2 g; The antibiotic was administered within an appropriate time interval prior to skin puncture. ANESTHESIA/SEDATION: Versed 2.0 mg IV; Fentanyl 100 mcg IV; Moderate Sedation Time:  35 minutes The patient was continuously monitored during the procedure by the interventional radiology nurse under my direct supervision. FLUOROSCOPY TIME:  54 seconds, 3 mGy COMPLICATIONS: None immediate. PROCEDURE: The procedure, risks, benefits, and alternatives were explained to the patient. Questions regarding the procedure were encouraged and answered. The patient understands and consents to the procedure. Patient was placed supine on the interventional table. Ultrasound confirmed a patent right internal jugular vein. The right chest and neck were cleaned with a skin antiseptic and a sterile drape was placed. Maximal barrier sterile technique was utilized including caps, mask, sterile gowns, sterile gloves, sterile drape, hand hygiene and skin antiseptic. The right neck was anesthetized with 1% lidocaine. Small incision was made in the right neck with a blade. Micropuncture set was placed in the right internal jugular vein with ultrasound guidance. The micropuncture wire was used for measurement purposes. The right chest was anesthetized with 1% lidocaine with epinephrine. #15 blade was used to make an incision and a subcutaneous port pocket was formed. [place] was assembled. Subcutaneous tunnel was formed with a stiff tunneling device. The port catheter was brought through the subcutaneous tunnel. The port was placed in the subcutaneous pocket. The micropuncture set was exchanged for a peel-away sheath. The catheter was placed through the peel-away sheath and the tip was positioned near the superior cavoatrial junction. Catheter placement  was confirmed with fluoroscopy. The port was accessed and flushed with saline. The port pocket was closed using two layers of absorbable sutures and Dermabond. The vein skin site was closed using a single layer of absorbable suture and Dermabond. Sterile dressings were applied. Patient tolerated the procedure well without an immediate complication. Ultrasound confirmed a patent right internal jugular vein. Ultrasound was used for vascular guidance. Ultrasound image was saved for documentation. Fluoroscopic images were taken and saved for this procedure. IMPRESSION: Placement of a subcutaneous port device. This is a CT injectable Port-A-Cath. Electronically Signed: By: Markus Daft M.D. On: 10/11/2017 14:49   Ir Fluoro Guide Port Insertion Right  Addendum Date: 10/11/2017   ADDENDUM REPORT: 10/11/2017 17:32 ADDENDUM: The port was secured to the chest wall with suture. Electronically Signed   By: Markus Daft M.D.   On: 10/11/2017 17:32   Result Date: 10/11/2017 INDICATION: 77 year old with history of uterine cancer. Recently biopsied  pelvic mass demonstrates a poorly differentiated carcinoma. Patient needs Port-A-Cath for chemotherapy. EXAM: FLUOROSCOPIC AND ULTRASOUND GUIDED PLACEMENT OF A SUBCUTANEOUS PORT COMPARISON:  None. MEDICATIONS: Ancef 2 g; The antibiotic was administered within an appropriate time interval prior to skin puncture. ANESTHESIA/SEDATION: Versed 2.0 mg IV; Fentanyl 100 mcg IV; Moderate Sedation Time:  35 minutes The patient was continuously monitored during the procedure by the interventional radiology nurse under my direct supervision. FLUOROSCOPY TIME:  54 seconds, 3 mGy COMPLICATIONS: None immediate. PROCEDURE: The procedure, risks, benefits, and alternatives were explained to the patient. Questions regarding the procedure were encouraged and answered. The patient understands and consents to the procedure. Patient was placed supine on the interventional table. Ultrasound confirmed a patent  right internal jugular vein. The right chest and neck were cleaned with a skin antiseptic and a sterile drape was placed. Maximal barrier sterile technique was utilized including caps, mask, sterile gowns, sterile gloves, sterile drape, hand hygiene and skin antiseptic. The right neck was anesthetized with 1% lidocaine. Small incision was made in the right neck with a blade. Micropuncture set was placed in the right internal jugular vein with ultrasound guidance. The micropuncture wire was used for measurement purposes. The right chest was anesthetized with 1% lidocaine with epinephrine. #15 blade was used to make an incision and a subcutaneous port pocket was formed. [place] was assembled. Subcutaneous tunnel was formed with a stiff tunneling device. The port catheter was brought through the subcutaneous tunnel. The port was placed in the subcutaneous pocket. The micropuncture set was exchanged for a peel-away sheath. The catheter was placed through the peel-away sheath and the tip was positioned near the superior cavoatrial junction. Catheter placement was confirmed with fluoroscopy. The port was accessed and flushed with saline. The port pocket was closed using two layers of absorbable sutures and Dermabond. The vein skin site was closed using a single layer of absorbable suture and Dermabond. Sterile dressings were applied. Patient tolerated the procedure well without an immediate complication. Ultrasound confirmed a patent right internal jugular vein. Ultrasound was used for vascular guidance. Ultrasound image was saved for documentation. Fluoroscopic images were taken and saved for this procedure. IMPRESSION: Placement of a subcutaneous port device. This is a CT injectable Port-A-Cath. Electronically Signed: By: Markus Daft M.D. On: 10/11/2017 14:49        Scheduled Meds: . CARBOplatin  150 mg Intravenous Once  . chlorthalidone  25 mg Oral Daily  . diclofenac sodium  2 g Topical QID  .  diltiazem  300 mg Oral Daily  . dronabinol  2.5 mg Oral BID AC  . lactose free nutrition  237 mL Oral BID BM  . levothyroxine  50 mcg Oral QAC breakfast  . losartan  100 mg Oral Daily  . PACLitaxel  80 mg/m2 (Treatment Plan Recorded) Intravenous Once  . pantoprazole  40 mg Oral BID  . polyethylene glycol  17 g Oral Daily  . senna-docusate  1 tablet Oral BID   Continuous Infusions: . sodium chloride    . sodium chloride    . famotidine    . famotidine       LOS: 6 days    Time spent: 35 minutes    Irine Seal, MD [facility] Pager [phone]  If 7PM-7AM, please contact night-coverage [email or web address] Password [email or web address] 10/13/2017, 12:52 PM

## 2017-10-13 NOTE — Evaluation (Signed)
Occupational Therapy Evaluation Patient Details Name: Shannon Scott MRN: [id number] DOB: April 30, 1941 Today's Date: 10/13/2017    History of Present Illness 77 y.o. female with medical history significant of CAD s/p CABG, endometrial cancer, A. fib with RVR, and  hypothyroidism; who presents with complaints of generalized weakness and continued rectal bleeding.    Clinical Impression   Met pt lying in bed with family member present this date. Reports fatigue from meds given with chemo, deferring OOB activity at this time. Education given on the importance of  OOB activity to increase activity tolerance and maintain respiratory health to continue engagement in BADL activity, pt understanding but still politely declined. Educated pt on importance of sitting up in chair for every meal, pt agreeable to complete this. Pt expressed interest in acquiring shower seat/tub bench to maintain safety and activity tolerance during shower. Will further assess mobility for appropriate equipment referral and education. Energy conservation education given in relation to BADL of planning dressing and grooming sequences and using strategic chair placement throughout the house for needed rest breaks, pt in understanding. Will continue to progress mobility as pt tolerates.    Follow Up Recommendations  No OT follow up    Equipment Recommendations  Tub/shower seat    Recommendations for Other Services       Precautions / Restrictions Precautions Precautions: Fall Restrictions Weight Bearing Restrictions: No      Mobility Bed Mobility                  Transfers                 General transfer comment: pt deferred OOB mobility 2/2 to drowsiness from benedryl with chemo tx    Balance                                           ADL either performed or assessed with clinical judgement   ADL Overall ADL's : Needs assistance/impaired Eating/Feeding: Set up     Grooming  Details (indicate cue type and reason): anticipate pt needing min guard A for standing grooming tasks 2/2 to decreased activity tolerance   Upper Body Bathing Details (indicate cue type and reason): anticipate pt needing set up A 2/2 to decreased activity tolerance   Lower Body Bathing Details (indicate cue type and reason): anticipate pt needing min A with shower seat to address LEs, pt reports standing showers becomming increasingly difficult Upper Body Dressing : Set up Upper Body Dressing Details (indicate cue type and reason): anticipate set up A 2/2 to decreased activity tolerance   Lower Body Dressing Details (indicate cue type and reason): anticipate min A 2/2 to decreased activity tolerance    Toilet Transfer Details (indicate cue type and reason): per nursing and pt, pt getting to BR with min A, anticipate min A with 2WW Toileting- Clothing Manipulation and Hygiene: Set up Toileting - Clothing Manipulation Details (indicate cue type and reason): anticipate set up A 2/2 to decreased activity tolerance Tub/ Shower Transfer: Minimal assistance Clinical cytogeneticist Details (indicate cue type and reason): anticipate min A 2.2 to decreased activity tolerance Functional mobility during ADLs: Minimal assistance       Vision Baseline Vision/History: Wears glasses Wears Glasses: At all times Patient Visual Report: No change from baseline       Perception     Praxis  Pertinent Vitals/Pain Pain Assessment: No/denies pain(c/o of previous L hip pain)     Hand Dominance     Extremity/Trunk Assessment Upper Extremity Assessment Upper Extremity Assessment: Overall WFL for tasks assessed           Communication Communication Communication: No difficulties   Cognition Arousal/Alertness: Awake/alert Behavior During Therapy: WFL for tasks assessed/performed Overall Cognitive Status: Within Functional Limits for tasks assessed                                      General Comments       Exercises Other Exercises Other Exercises: Education given on energy conservation and incorporating into daily routine to maintain/improve activity tolerance to complete daily tasks (showering, dressing, grooming tips given)   Shoulder Instructions      Home Living Family/patient expects to be discharged to:: Private residence Living Arrangements: Alone Available Help at Discharge: Family Type of Home: House Home Access: Stairs to enter Technical brewer of Steps: 2-3 Entrance Stairs-Rails: None Home Layout: One level     Bathroom Shower/Tub: Teacher, early years/pre: Standard     Home Equipment: Cane - single point;Walker - 2 wheels          Prior Functioning/Environment Level of Independence: Independent with assistive device(s)                 OT Problem List: Decreased activity tolerance;Decreased knowledge of use of DME or AE;Decreased strength;Impaired balance (sitting and/or standing)      OT Treatment/Interventions: Energy conservation    OT Goals(Current goals can be found in the care plan section) Acute Rehab OT Goals Patient Stated Goal: to return to functional independence she was at prior to admin OT Goal Formulation: With patient Time For Goal Achievement: 10/27/17 Potential to Achieve Goals: Good  OT Frequency: Min 2X/week   Barriers to D/C:            Co-evaluation              AM-PAC PT "6 Clicks" Daily Activity     Outcome Measure Help from another person eating meals?: None Help from another person taking care of personal grooming?: A Little Help from another person toileting, which includes using toliet, bedpan, or urinal?: A Little Help from another person bathing (including washing, rinsing, drying)?: A Little Help from another person to put on and taking off regular upper body clothing?: A Little Help from another person to put on and taking off regular lower body clothing?: A  Little 6 Click Score: 19   End of Session Nurse Communication: Other (comment)(to address IV at end of cycle)  Activity Tolerance: Patient limited by fatigue;Other (comment) Patient left: in bed;with call bell/phone within reach;with family/visitor present  OT Visit Diagnosis: Muscle weakness (generalized) (M62.81)                Time: 1419-1430 OT Time Calculation (min): 11 min Charges:  OT General Charges $OT Visit: 1 Visit OT Evaluation $OT Eval Low Complexity: 1 Low G-Codes:       Zenovia Jarred, [place], OTR/L [phone]  Zenovia Jarred 10/13/2017, 5:26 PM

## 2017-10-13 NOTE — Progress Notes (Signed)
IP PROGRESS NOTE  Subjective:   Shannon Scott reports nausea this morning.  No pain.  She is constipated. She began radiation yesterday.  Objective: Vital signs in last 24 hours: Blood pressure (!) 153/55, pulse 77, temperature 98.5 F (36.9 C), temperature source Oral, resp. rate 16, height 5' 1"  (1.549 m), weight 142 lb 10.2 oz (64.7 kg), SpO2 93 %.  Intake/Output from previous day: 05/15 0701 - 05/16 0700 In: 2901.3 [P.O.:360; I.V.:2541.3] Out: -   Physical Exam:  HEENT: No thrush Lungs: Decreased breath sounds at the right lower posterior chest, no respiratory distress Cardiac: Regular rate and rhythm, 2/6 systolic murmur Abdomen: Left mid abdominal colostomy with firm stool Extremities: Trace edema throughout the left leg   Portacath/PICC-without erythema  Lab Results: Recent Labs    10/12/17 0317 10/13/17 0535  WBC 11.2* 11.2*  HGB 10.8* 10.4*  HCT 33.3* 32.2*  PLT 429* 398    BMET Recent Labs    10/12/17 0317 10/13/17 0535  NA 135 139  K 3.6 3.6  CL 100* 103  CO2 25 25  GLUCOSE 97 109*  BUN 6 6  CREATININE 0.71 0.66  CALCIUM 9.0 9.1    Lab Results  Component Value Date   CEA1 33.1 (H) 10/05/2017    Studies/Results: Ir Ivc Filter Plmt / S&i /img Guid/mod Sed  Result Date: 10/11/2017 INDICATION: 77 year old with 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 Patient has a DVT in the left lower extremity. Patient has recent GI bleeding and she is not a candidate for anticoagulation at this time. Plan for placement of an IVC filter. EXAM: IVC FILTER PLACEMENT; IVC VENOGRAM; ULTRASOUND FOR VASCULAR ACCESS Physician: Stephan Minister. Anselm Pancoast, MD MEDICATIONS: None. ANESTHESIA/SEDATION: 1 mg Versed. This procedure was performed immediately following the Port-A-Cath placement. The patient was continuously monitored during the procedure by the interventional radiology nurse under my direct supervision. CONTRAST:  80 mL Isovue-300 FLUOROSCOPY TIME:  Fluoroscopy Time: 1  minutes 30 seconds (28 mGy). COMPLICATIONS: None immediate. PROCEDURE: The procedure was explained to the patient. The risks and benefits of the procedure were discussed and the patient's questions were addressed. Informed consent was obtained from the patient. Ultrasound demonstrated a patent right common femoral vein. Ultrasound images were obtained for documentation. The right groin was prepped and draped in a sterile fashion. Maximal barrier sterile technique was utilized including caps, mask, sterile gowns, sterile gloves, sterile drape, hand hygiene and skin antiseptic. The skin was anesthetized with 1% lidocaine. A 21 gauge needle was directed into the vein with ultrasound guidance and a micropuncture dilator set was placed. A wire was advanced into the IVC. The filter sheath was advanced over the wire into the IVC. An IVC venogram was performed. Fluoroscopic images were obtained for documentation. A Bard Denali filter was deployed below the lowest renal vein. A follow-up venogram was performed and the vascular sheath was removed with manual compression. FINDINGS: IVC was patent. Bilateral renal veins were identified. The filter was deployed below the lowest renal vein. Follow-up venogram confirmed placement within the IVC and below the renal veins. IMPRESSION: Successful placement of a retrievable IVC filter. PLAN: Due to patient related comorbidities and/or clinical necessity, this IVC filter should be considered a permanent device. This patient will not be actively followed for future filter retrieval. Electronically Signed   By: Markus Daft M.D.   On: 10/11/2017 14:55   Ir US Guide Vasc Access Right  Addendum Date: 10/11/2017   ADDENDUM REPORT: 10/11/2017 17:32 ADDENDUM: The port was secured  to the chest wall with suture. Electronically Signed   By: Markus Daft M.D.   On: 10/11/2017 17:32   Result Date: 10/11/2017 INDICATION: 77 year old with history of uterine cancer. Recently biopsied pelvic mass  demonstrates a poorly differentiated carcinoma. Patient needs Port-A-Cath for chemotherapy. EXAM: FLUOROSCOPIC AND ULTRASOUND GUIDED PLACEMENT OF A SUBCUTANEOUS PORT COMPARISON:  None. MEDICATIONS: Ancef 2 g; The antibiotic was administered within an appropriate time interval prior to skin puncture. ANESTHESIA/SEDATION: Versed 2.0 mg IV; Fentanyl 100 mcg IV; Moderate Sedation Time:  35 minutes The patient was continuously monitored during the procedure by the interventional radiology nurse under my direct supervision. FLUOROSCOPY TIME:  54 seconds, 3 mGy COMPLICATIONS: None immediate. PROCEDURE: The procedure, risks, benefits, and alternatives were explained to the patient. Questions regarding the procedure were encouraged and answered. The patient understands and consents to the procedure. Patient was placed supine on the interventional table. Ultrasound confirmed a patent right internal jugular vein. The right chest and neck were cleaned with a skin antiseptic and a sterile drape was placed. Maximal barrier sterile technique was utilized including caps, mask, sterile gowns, sterile gloves, sterile drape, hand hygiene and skin antiseptic. The right neck was anesthetized with 1% lidocaine. Small incision was made in the right neck with a blade. Micropuncture set was placed in the right internal jugular vein with ultrasound guidance. The micropuncture wire was used for measurement purposes. The right chest was anesthetized with 1% lidocaine with epinephrine. #15 blade was used to make an incision and a subcutaneous port pocket was formed. [place] was assembled. Subcutaneous tunnel was formed with a stiff tunneling device. The port catheter was brought through the subcutaneous tunnel. The port was placed in the subcutaneous pocket. The micropuncture set was exchanged for a peel-away sheath. The catheter was placed through the peel-away sheath and the tip was positioned near the superior cavoatrial  junction. Catheter placement was confirmed with fluoroscopy. The port was accessed and flushed with saline. The port pocket was closed using two layers of absorbable sutures and Dermabond. The vein skin site was closed using a single layer of absorbable suture and Dermabond. Sterile dressings were applied. Patient tolerated the procedure well without an immediate complication. Ultrasound confirmed a patent right internal jugular vein. Ultrasound was used for vascular guidance. Ultrasound image was saved for documentation. Fluoroscopic images were taken and saved for this procedure. IMPRESSION: Placement of a subcutaneous port device. This is a CT injectable Port-A-Cath. Electronically Signed: By: Markus Daft M.D. On: 10/11/2017 14:49   Ir Fluoro Guide Port Insertion Right  Addendum Date: 10/11/2017   ADDENDUM REPORT: 10/11/2017 17:32 ADDENDUM: The port was secured to the chest wall with suture. Electronically Signed   By: Markus Daft M.D.   On: 10/11/2017 17:32   Result Date: 10/11/2017 INDICATION: 77 year old with history of uterine cancer. Recently biopsied pelvic mass demonstrates a poorly differentiated carcinoma. Patient needs Port-A-Cath for chemotherapy. EXAM: FLUOROSCOPIC AND ULTRASOUND GUIDED PLACEMENT OF A SUBCUTANEOUS PORT COMPARISON:  None. MEDICATIONS: Ancef 2 g; The antibiotic was administered within an appropriate time interval prior to skin puncture. ANESTHESIA/SEDATION: Versed 2.0 mg IV; Fentanyl 100 mcg IV; Moderate Sedation Time:  35 minutes The patient was continuously monitored during the procedure by the interventional radiology nurse under my direct supervision. FLUOROSCOPY TIME:  54 seconds, 3 mGy COMPLICATIONS: None immediate. PROCEDURE: The procedure, risks, benefits, and alternatives were explained to the patient. Questions regarding the procedure were encouraged and answered. The patient understands and consents to the  procedure. Patient was placed supine on the interventional table.  Ultrasound confirmed a patent right internal jugular vein. The right chest and neck were cleaned with a skin antiseptic and a sterile drape was placed. Maximal barrier sterile technique was utilized including caps, mask, sterile gowns, sterile gloves, sterile drape, hand hygiene and skin antiseptic. The right neck was anesthetized with 1% lidocaine. Small incision was made in the right neck with a blade. Micropuncture set was placed in the right internal jugular vein with ultrasound guidance. The micropuncture wire was used for measurement purposes. The right chest was anesthetized with 1% lidocaine with epinephrine. #15 blade was used to make an incision and a subcutaneous port pocket was formed. [place] was assembled. Subcutaneous tunnel was formed with a stiff tunneling device. The port catheter was brought through the subcutaneous tunnel. The port was placed in the subcutaneous pocket. The micropuncture set was exchanged for a peel-away sheath. The catheter was placed through the peel-away sheath and the tip was positioned near the superior cavoatrial junction. Catheter placement was confirmed with fluoroscopy. The port was accessed and flushed with saline. The port pocket was closed using two layers of absorbable sutures and Dermabond. The vein skin site was closed using a single layer of absorbable suture and Dermabond. Sterile dressings were applied. Patient tolerated the procedure well without an immediate complication. Ultrasound confirmed a patent right internal jugular vein. Ultrasound was used for vascular guidance. Ultrasound image was saved for documentation. Fluoroscopic images were taken and saved for this procedure. IMPRESSION: Placement of a subcutaneous port device. This is a CT injectable Port-A-Cath. Electronically Signed: By: Markus Daft M.D. On: 10/11/2017 14:49    Medications: I have reviewed the patient's current medications.  Assessment/Plan:  1.  Metastatic uterine  cancer  Endometrial adenocarcinoma, superficial myometrial invasion,pT1a, status post a hysterectomy 12/30/2015  Left pelvic mass with involvement of the sigmoid colon- biopsy 10/05/2017, poorly differentiated carcinoma consistent with a GYN primary  Initiation of pelvic radiation 10/12/2017  2.  Sigmoid colon obstruction secondary to #1, status post a diverting colostomy 08/21/2017  3.   Left lower extremity DVT confirmed on Doppler 10/10/2017, IVC filter placement 10/11/2017  4.  History of paroxysmal atrial fibrillation  5.  History of coronary artery disease   Shannon Scott is admitted with rectal bleeding related to a malignant pelvic mass.  She was confirmed to have a left lower extremity deep vein thrombosis and an IVC filter was placed.  She is maintained off of anticoagulation therapy.  The pelvic mass is most likely related to recurrence of the endometrial cancer diagnosed in 2017.  She began palliative radiation to the pelvic mass yesterday.  The plan is to begin weekly Taxol/carboplatin chemotherapy today as recommended by Dr. Marin Olp.  I reviewed the potential toxicities associated with the Taxol/carboplatin regimen with Shannon Scott today.  She understands the potential for nausea/vomiting, alopecia, and hematologic toxicity.  We discussed the neuropathy and allergic reaction associated with Taxol.  We discussed the potential for an allergic reaction with carboplatin.  She agrees to proceed.    LOS: 6 days   Betsy Coder, MD   10/13/2017, 7:14 AM

## 2017-10-13 NOTE — Progress Notes (Addendum)
Received a call from [facility] on [facility] to see if pt would still be getting radiation today since she's admitted. Called [facility] and spoke to pt's nurse Jenny Reichmann who stated pt was stable enough to receive treatment, but would also be receiving Taxol/Carboplatin around 12:00. Therefore it would be better to get the pt before the chemo infusion started. Called [facility] with update who stated they would try to get the pt before 12:00. Cindy-RN made aware of plan.

## 2017-10-13 NOTE — Progress Notes (Signed)
Dosages and dilutions of Taxol and Carboplatin verified with Clotilde Dieter, RN.

## 2017-10-14 ENCOUNTER — Ambulatory Visit: Payer: Medicare Other

## 2017-10-14 ENCOUNTER — Ambulatory Visit
Admit: 2017-10-14 | Discharge: 2017-10-14 | Disposition: A | Discharge status: Home / Self Care | Payer: Medicare Other | Attending: Radiation Oncology | Admitting: Radiation Oncology

## 2017-10-14 DIAGNOSIS — C785 Secondary malignant neoplasm of large intestine and rectum: Principal | ICD-10-CM

## 2017-10-14 DIAGNOSIS — I480 Paroxysmal atrial fibrillation: Secondary | ICD-10-CM

## 2017-10-14 DIAGNOSIS — I82402 Acute embolism and thrombosis of unspecified deep veins of left lower extremity: Secondary | ICD-10-CM

## 2017-10-14 DIAGNOSIS — K625 Hemorrhage of anus and rectum: Secondary | ICD-10-CM

## 2017-10-14 DIAGNOSIS — Z5111 Encounter for antineoplastic chemotherapy: Secondary | ICD-10-CM

## 2017-10-14 LAB — CBC WITH DIFFERENTIAL/PLATELET
Basophils Absolute: 0 10*3/uL (ref 0.0–0.1)
Basophils Relative: 0 %
Eosinophils Absolute: 0 10*3/uL (ref 0.0–0.7)
Eosinophils Relative: 0 %
HCT: 36.7 % (ref 36.0–46.0)
Hemoglobin: 12 g/dL (ref 12.0–15.0)
Lymphocytes Relative: 3 %
Lymphs Abs: 0.4 10*3/uL — ABNORMAL LOW (ref 0.7–4.0)
MCH: 28.4 pg (ref 26.0–34.0)
MCHC: 32.7 g/dL (ref 30.0–36.0)
MCV: 87 fL (ref 78.0–100.0)
Monocytes Absolute: 0 10*3/uL — ABNORMAL LOW (ref 0.1–1.0)
Monocytes Relative: 0 %
Neutro Abs: 11 10*3/uL — ABNORMAL HIGH (ref 1.7–7.7)
Neutrophils Relative %: 97 %
Platelets: 497 10*3/uL — ABNORMAL HIGH (ref 150–400)
RBC: 4.22 MIL/uL (ref 3.87–5.11)
RDW: 15.7 % — ABNORMAL HIGH (ref 11.5–15.5)
WBC: 11.4 10*3/uL — ABNORMAL HIGH (ref 4.0–10.5)

## 2017-10-14 LAB — BASIC METABOLIC PANEL
Anion gap: 12 (ref 5–15)
BUN: 10 mg/dL (ref 6–20)
CO2: 25 mmol/L (ref 22–32)
Calcium: 9.2 mg/dL (ref 8.9–10.3)
Chloride: 98 mmol/L — ABNORMAL LOW (ref 101–111)
Creatinine, Ser: 0.68 mg/dL (ref 0.44–1.00)
GFR calc Af Amer: >60 mL/min (ref >60)
GFR calc non Af Amer: >60 mL/min (ref >60)
Glucose, Bld: 159 mg/dL — ABNORMAL HIGH (ref 65–99)
Potassium: 3.6 mmol/L (ref 3.5–5.1)
Sodium: 135 mmol/L (ref 135–145)

## 2017-10-14 LAB — MAGNESIUM: Magnesium: 2.5 mg/dL — ABNORMAL HIGH (ref 1.7–2.4)

## 2017-10-14 MED ORDER — LACTULOSE 10 GM/15ML PO SOLN
20.0000 g | Freq: Once | ORAL | Status: AC
  Administered 2017-10-14: 20 g via ORAL
  Filled 2017-10-14: qty 30

## 2017-10-14 MED ORDER — ACETAMINOPHEN 500 MG PO TABS
500.0000 mg | ORAL_TABLET | Freq: Four times a day (QID) | ORAL | Status: DC | PRN
  Administered 2017-10-14 – 2017-10-15 (×2): 500 mg via ORAL
  Filled 2017-10-14 (×2): qty 1

## 2017-10-14 MED ORDER — POLYETHYLENE GLYCOL 3350 17 G PO PACK
17.0000 g | PACK | Freq: Two times a day (BID) | ORAL | Status: DC
  Administered 2017-10-14 – 2017-10-15 (×2): 17 g via ORAL
  Filled 2017-10-14 (×3): qty 1

## 2017-10-14 NOTE — Progress Notes (Signed)
IP PROGRESS NOTE  Subjective:   Shannon Scott completed cycle 1 Taxol/carboplatin yesterday.  She reports tolerating the chemotherapy well.  No nausea or symptom of an allergic reaction.  She reports dark blood per rectum. Objective: Vital signs in last 24 hours: Blood pressure (!) 157/63, pulse 70, temperature 97.7 F (36.5 C), temperature source Oral, resp. rate 17, height 5' 1"  (1.549 m), weight 141 lb 15.7 oz (64.4 kg), SpO2 95 %.  Intake/Output from previous day: 05/16 0701 - 05/17 0700 In: 240 [P.O.:240] Out: 0   Physical Exam:  HEENT: No thrush Lungs: Lungs clear anteriorly Cardiac: Regular rate and rhythm, 2/6 systolic murmur Abdomen: Left mid abdominal colostomy with firm stool Extremities: Trace edema throughout the left leg   Portacath/PICC-without erythema  Lab Results: Recent Labs    10/13/17 0535 10/14/17 0400  WBC 11.2* 11.4*  HGB 10.4* 12.0  HCT 32.2* 36.7  PLT 398 497*    BMET Recent Labs    10/13/17 0535 10/14/17 0400  NA 139 135  K 3.6 3.6  CL 103 98*  CO2 25 25  GLUCOSE 109* 159*  BUN 6 10  CREATININE 0.66 0.68  CALCIUM 9.1 9.2    Lab Results  Component Value Date   CEA1 33.1 (H) 10/05/2017     Medications: I have reviewed the patient's current medications.  Assessment/Plan:  1.  Metastatic uterine cancer  Endometrial adenocarcinoma, superficial myometrial invasion,pT1a, status post a hysterectomy 12/30/2015  Left pelvic mass with involvement of the sigmoid colon- biopsy 10/05/2017, poorly differentiated carcinoma consistent with a GYN primary  Initiation of pelvic radiation 10/12/2017  Cycle 1 weekly Taxol/carboplatin 10/13/2017  2.  Sigmoid colon obstruction secondary to #1, status post a diverting colostomy 08/21/2017  3.   Left lower extremity DVT confirmed on Doppler 10/10/2017, IVC filter placement 10/11/2017  4.  History of paroxysmal atrial fibrillation  5.  History of coronary artery disease   Shannon Scott tolerated  the first cycle of Taxol/carboplatin without significant acute toxicity.  She continues daily radiation.  She is stable for discharge from an oncology standpoint if she is ambulatory and taking p.o.  Outpatient follow-up will be arranged at the [facility] with Dr. Marin Olp for an office visit and the next cycle of chemotherapy 10/20/2017.  Please call oncology over the weekend as needed.   LOS: 7 days   Shannon Coder, MD   10/14/2017, 7:46 AM

## 2017-10-14 NOTE — Progress Notes (Signed)
PT Cancellation Note  Patient Details Name: Shannon Scott MRN: [id number] DOB: [birth year]   Cancelled Treatment:    Reason Eval/Treat Not Completed: PT screened, no needs identified, will sign off. Pt refuses PT services. Will sign off at pt's request.    Weston Anna, MPT Pager: [phone]

## 2017-10-14 NOTE — Progress Notes (Signed)
PROGRESS NOTE    Shannon Scott  WUJ:[id number] DOB: [birth year] DOA: 10/03/2017 PCP: Leeroy Cha, MD   Brief Narrative:  77 y.o.femalewith medical history significant ofCADs/p CABG,endometrial cancer, A. fib with RVR,andhypothyroidism;who presents with complaints of generalized weakness and continued rectal bleeding.  She just been recently hospitalized3/21-3/29,after coming inwithcomplaints ofabdominal pain with nausea andvomiting. Patient was found to have a ill-defined heterogeneous mass involving the sigmoid colon of the pelvis with signs of possible perforationand abscess during the hospitalization patient was evaluated by general surgery, cardiology, urology, and orthopedic surgery. She underwent JJ stenting and laparoscopic diverting loopcolostomyby Dr. Romona Curls 3/24.She was initially discharged to a rehab facility,but has been home for a while. Since being hospitalized patient reports still having rectal bleeding. She will get the urge to have a bowel movement and when she goes notes clots of dark blood present. Patient going once or twice per day. She notes that she is just had generalized weakness and felt as though she may pass out. Denies having any loss of consciousness or recent falls. Her biggest complaint is continued left-sided hip pain that runs down her leg and into her groin area.Just 3 days ago,she had a scope by Dr. Collene Mares ofgastroenterology,but the mass was not able to be reached. Patient notes her weakness symptoms were sosevere today to the point which she was unable to get around and she came in for further evaluation.   Hospital course complicated by acute GI related blood loss secondary to cancer of which GI is unable to do anything.  Patient recently required transfusion.  Further evaluation would reveal DVTs of which patient was sent to get an IVC filter placed.  Oncology consulted after biopsy results available  and plan is for chemotherapy.     Assessment & Plan:   Principal Problem:   Rectal bleeding Active Problems:   Hypothyroid   Essential hypertension   Normocytic anemia   Left hip pain   Generalized weakness   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 (HCC)   Goals of care, counseling/discussion   Abdominal mass   Deep vein thrombosis (DVT) of left lower extremity (HCC)   Malignant neoplasm of uterus (HCC)   Pelvic mass   Acute blood loss anemia  1 pelvic mass with involvement of sigmoid colon biopsy 10/05/2017 poorly differentiated carcinoma consistent with GYN primary/metastatic uterine cancer/ rectal bleeding Patient had presented with weakness rectal bleeding.  CT scan from 08/18/2017 showed sigmoid colon of pelvis with signs of possible perforation and abscess.  Hemoglobin is stable.  Patient status post diverting loop colostomy 08/21/2017.  Patient underwent recent scope by Dr. Collene Mares and unable to obtain tissue biopsy.  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  Patient being followed by oncology and patient transferred to the third floor to begin chemotherapy today.  Patient received initial chemotherapy with no significant complications and seem to have tolerated it..  Patient status post 2 units packed red blood cells 10/10/2017.  Hemoglobin currently stable at 12.  Nothing further to add per GI for bleeding.  Transfuse as needed.  Per oncology.   2.  Left lower extremity DVT Likely secondary to malignancy.  Due to recent GI bleed patient not a candidate for anticoagulation at this time.  Status post IVC filter placement 10/11/2017 per interventional radiology.  Needs outpatient follow-up with oncology.   3.  Generalized weakness/failure to thrive Likely secondary to  malignancy.  Supportive care.  4.  Acute blood loss anemia Prior  physician discussed with Dr. Collene Mares GI who felt there was nothing to do from a GI standpoint.  Patient status post IV iron 10/10/2017.  Patient status post transfusion of 2 units packed red blood cells 10/10/2017.  Hemoglobin currently stable and currently at 12.0.  Continue PPI.    5.  Left hip pain Likely secondary to pelvic mass and DVT.  Improvement with left hip pain.  Continue tramadol as needed.    6.  Hypertension Continue current regimen of chlorthalidone, losartan and Cardizem.  Outpatient follow-up.  7.  Hypothyroidism TSH of 3.917.  Continue Synthroid.  8.  Hypokalemia Repleted.  Potassium at 3.6.  Magnesium at 2.5.  Follow.   9.  Paroxysmal atrial fibrillation CHA2DS2VASC score = 4 Currently rate controlled.  Patient was not placed on anticoagulation initially secondary to recent ostomy.  Patient now with rectal bleeding and likely not a candidate for anticoagulation at this time.  Outpatient follow-up with cardiology.  10.  Coronary artery disease status post CABG Continue cardiac medications.    11.  Hx of endometrial cancer Per oncology.   DVT prophylaxis: SCDs Code Status: Full Family Communication: Updated patient.  No family at bedside.   Disposition Plan: Likely home when okay with oncology hopefully in the next 24 to 48 hours.    Consultants:   Oncology: Dr. Marin Olp 10/09/2017  Radiation oncology Dr. Dwana Curd 10/12/2017  Procedures:   CT abdomen and pelvis 10/03/2017  CT-guided biopsy of left pelvic mass per interventional radiology Dr. Laurence Ferrari  Bilateral lower extremity edema Dopplers 10/10/2017  Ultrasound-guided placement of subcutaneous port.  Interventional radiology, Dr. Anselm Pancoast 10/11/2017  Transfused 2 units packed red blood cells 10/10/2017.  IVC filter placement per Dr. Anselm Pancoast 10/11/2017  Antimicrobials:   None   Subjective: Patient getting up in chair.  Feels better.  Denies any chest pain or shortness of breath.  Tolerating oral intake.  No  nausea or emesis.  Complains of constipation.  Denies any further rectal bleeding.  Objective: Vitals:   10/13/17 1535 10/13/17 1721 10/13/17 2205 10/14/17 0532  BP: (!) 152/70 (!) 148/63 139/62 (!) 157/63  Pulse: 75 77 72 70  Resp: 16 16 14 17   Temp: 98.4 F (36.9 C) 98 F (36.7 C) 98.1 F (36.7 C) 97.7 F (36.5 C)  TempSrc: Oral Oral Oral Oral  SpO2: 93% 93% 95% 95%  Weight:    64.4 kg (141 lb 15.7 oz)  Height:        Intake/Output Summary (Last 24 hours) at 10/14/2017 1108 Last data filed at 10/13/2017 2100 Gross per 24 hour  Intake 120 ml  Output 0 ml  Net 120 ml   Filed Weights   10/12/17 0500 10/13/17 1311 10/14/17 0532  Weight: 64.7 kg (142 lb 10.2 oz) 64.9 kg (143 lb) 64.4 kg (141 lb 15.7 oz)    Examination:  General exam: NAD Respiratory system: CTAB. No wheezing, no crackles, no rhonchi. Respiratory effort normal. Cardiovascular system: Regular rate and rhythm with 3 out of 6 systolic ejection murmur.  No rubs or gallops.  No JVD.  No lower extremity edema.  Gastrointestinal system: Abdomen is nontender, nondistended, soft, positive bowel sounds.  Colostomy intact with a hard stool ball.  Central nervous system: Alert and oriented. No focal neurological deficits. Extremities: Symmetric 5 x 5 power. Skin: No rashes, lesions or ulcers Psychiatry: Judgement and insight appear normal. Mood & affect appropriate.     Data  Reviewed: I have personally reviewed following labs and imaging studies  CBC: Recent Labs  Lab 10/10/17 0613 10/11/17 0541 10/12/17 0317 10/13/17 0535 10/14/17 0400  WBC 8.4 8.3 11.2* 11.2* 11.4*  NEUTROABS 6.6 6.2 9.1* 9.5* 11.0*  HGB 8.0* 10.9* 10.8* 10.4* 12.0  HCT 25.3* 32.9* 33.3* 32.2* 36.7  MCV 84.9 85.0 86.0 87.0 87.0  PLT 405* 454* 429* 398 073*   Basic Metabolic Panel: Recent Labs  Lab 10/10/17 0613 10/11/17 0541 10/12/17 0317 10/13/17 0535 10/14/17 0400  NA 139 140 135 139 135  K 3.7 3.5 3.6 3.6 3.6  CL 104 101 100*  103 98*  CO2 25 26 25 25 25   GLUCOSE 96 91 97 109* 159*  BUN 6 7 6 6 10   CREATININE 0.68 0.71 0.71 0.66 0.68  CALCIUM 9.1 9.5 9.0 9.1 9.2  MG  --   --   --  1.7 2.5*   GFR: Estimated Creatinine Clearance: 52.2 mL/min (by C-G formula based on SCr of 0.68 mg/dL). Liver Function Tests: Recent Labs  Lab 10/09/17 1039 10/10/17 0613 10/11/17 0541 10/12/17 0317  AST 18 11* 13* 11*  ALT 7* 9* 9* 7*  ALKPHOS 66 61 67 67  BILITOT 0.5 0.3 0.8 0.6  PROT 6.9 6.7 7.2 7.1  ALBUMIN 2.8* 2.6* 2.9* 2.9*   No results for input(s): LIPASE, AMYLASE in the last 168 hours. No results for input(s): AMMONIA in the last 168 hours. Coagulation Profile: Recent Labs  Lab 10/11/17 0541  INR 1.05   Cardiac Enzymes: No results for input(s): CKTOTAL, CKMB, CKMBINDEX, TROPONINI in the last 168 hours. BNP (last 3 results) No results for input(s): PROBNP in the last 8760 hours. HbA1C: No results for input(s): HGBA1C in the last 72 hours. CBG: No results for input(s): GLUCAP in the last 168 hours. Lipid Profile: No results for input(s): CHOL, HDL, LDLCALC, TRIG, CHOLHDL, LDLDIRECT in the last 72 hours. Thyroid Function Tests: No results for input(s): TSH, T4TOTAL, FREET4, T3FREE, THYROIDAB in the last 72 hours. Anemia Panel: No results for input(s): VITAMINB12, FOLATE, FERRITIN, TIBC, IRON, RETICCTPCT in the last 72 hours. Sepsis Labs: No results for input(s): PROCALCITON, LATICACIDVEN in the last 168 hours.  No results found for this or any previous visit (from the past 240 hour(s)).       Radiology Studies: No results found.      Scheduled Meds: . chlorthalidone  25 mg Oral Daily  . diclofenac sodium  2 g Topical QID  . diltiazem  300 mg Oral Daily  . dronabinol  2.5 mg Oral BID AC  . lactose free nutrition  237 mL Oral TID WC  . levothyroxine  50 mcg Oral QAC breakfast  . losartan  100 mg Oral Daily  . pantoprazole  40 mg Oral BID  . polyethylene glycol  17 g Oral BID  .  senna-docusate  1 tablet Oral BID   Continuous Infusions: . sodium chloride    . famotidine       LOS: 7 days    Time spent: 35 minutes    Irine Seal, MD [facility] Pager [phone]  If 7PM-7AM, please contact night-coverage [email or web address] Password [email or web address] 10/14/2017, 11:08 AM

## 2017-10-14 NOTE — Progress Notes (Signed)
Per [facility] rep pt was active with [facility] for RN/PT/OT services. Awaiting PT eval at this time. Will need new home health orders if home health services needed at time of discharge. Marney Doctor RN,BSN,NCM [phone]

## 2017-10-14 NOTE — Progress Notes (Signed)
OT Cancellation Note  Patient Details Name: Shannon Scott MRN: [id number] DOB: [birth year]   Cancelled Treatment:   Met pt in room, stating she no longer wants to continue therapy services. Pt in understanding of acquiring shower chair. Educated pt if status change, alert nursing for therapy re order. Will sign off this date, no further OT need.    Zenovia Jarred, MSOT, OTR/L  [place] 10/14/2017, 4:49 PM

## 2017-10-14 NOTE — Progress Notes (Signed)
[facility] Radiation Oncology Dept Therapy Treatment Record Phone [phone]   Radiation Therapy was administered to Shannon Scott on: 10/14/2017  11:10 AM and was treatment # 3 out of a planned course of 28 treatments.  Radiation Treatment  1). Beam photons with 6-10 energy  2). Brachytherapy None  3). Stereotactic Radiosurgery None  4). Other Radiation None     Refoel Palladino A Evin Loiseau, RT (T)

## 2017-10-14 NOTE — Progress Notes (Signed)
Patient went to radiation treatment at 1105

## 2017-10-15 LAB — BASIC METABOLIC PANEL
Anion gap: 14 (ref 5–15)
BUN: 14 mg/dL (ref 6–20)
CO2: 24 mmol/L (ref 22–32)
Calcium: 9.2 mg/dL (ref 8.9–10.3)
Chloride: 99 mmol/L — ABNORMAL LOW (ref 101–111)
Creatinine, Ser: 0.62 mg/dL (ref 0.44–1.00)
GFR calc Af Amer: >60 mL/min (ref >60)
GFR calc non Af Amer: >60 mL/min (ref >60)
Glucose, Bld: 112 mg/dL — ABNORMAL HIGH (ref 65–99)
Potassium: 3.5 mmol/L (ref 3.5–5.1)
Sodium: 137 mmol/L (ref 135–145)

## 2017-10-15 LAB — CBC WITH DIFFERENTIAL/PLATELET
Basophils Absolute: 0 10*3/uL (ref 0.0–0.1)
Basophils Relative: 0 %
Eosinophils Absolute: 0 10*3/uL (ref 0.0–0.7)
Eosinophils Relative: 0 %
HCT: 32.9 % — ABNORMAL LOW (ref 36.0–46.0)
Hemoglobin: 10.8 g/dL — ABNORMAL LOW (ref 12.0–15.0)
Lymphocytes Relative: 3 %
Lymphs Abs: 0.3 10*3/uL — ABNORMAL LOW (ref 0.7–4.0)
MCH: 28.3 pg (ref 26.0–34.0)
MCHC: 32.8 g/dL (ref 30.0–36.0)
MCV: 86.4 fL (ref 78.0–100.0)
Monocytes Absolute: 0.4 10*3/uL (ref 0.1–1.0)
Monocytes Relative: 4 %
Neutro Abs: 10.8 10*3/uL — ABNORMAL HIGH (ref 1.7–7.7)
Neutrophils Relative %: 93 %
Platelets: 408 10*3/uL — ABNORMAL HIGH (ref 150–400)
RBC: 3.81 MIL/uL — ABNORMAL LOW (ref 3.87–5.11)
RDW: 15.8 % — ABNORMAL HIGH (ref 11.5–15.5)
WBC: 11.6 10*3/uL — ABNORMAL HIGH (ref 4.0–10.5)

## 2017-10-15 MED ORDER — ONDANSETRON HCL 4 MG/2ML IJ SOLN
4.0000 mg | Freq: Four times a day (QID) | INTRAMUSCULAR | Status: DC | PRN
  Administered 2017-10-15 (×2): 4 mg via INTRAVENOUS
  Filled 2017-10-15 (×2): qty 2

## 2017-10-15 MED ORDER — POLYETHYLENE GLYCOL 3350 17 G PO PACK
17.0000 g | PACK | Freq: Every day | ORAL | Status: DC
  Administered 2017-10-16: 17 g via ORAL
  Filled 2017-10-15: qty 1

## 2017-10-15 MED ORDER — MIRTAZAPINE 15 MG PO TABS
15.0000 mg | ORAL_TABLET | Freq: Every day | ORAL | Status: DC
  Administered 2017-10-15: 15 mg via ORAL
  Filled 2017-10-15: qty 1

## 2017-10-15 MED ORDER — POTASSIUM CHLORIDE CRYS ER 20 MEQ PO TBCR
40.0000 meq | EXTENDED_RELEASE_TABLET | Freq: Once | ORAL | Status: AC
  Administered 2017-10-15: 40 meq via ORAL
  Filled 2017-10-15: qty 2

## 2017-10-15 MED ORDER — SENNOSIDES-DOCUSATE SODIUM 8.6-50 MG PO TABS: 1.0000 | ORAL_TABLET | Freq: Every day | ORAL | Status: DC

## 2017-10-15 NOTE — Progress Notes (Signed)
PROGRESS NOTE    Shannon Scott  PJA:[id number] DOB: 09-28-1940 DOA: 10/03/2017 PCP: Leeroy Cha, MD   Brief Narrative:  77 y.o.femalewith medical history significant ofCADs/p CABG,endometrial cancer, A. fib with RVR,andhypothyroidism;who presents with complaints of generalized weakness and continued rectal bleeding.  She just been recently hospitalized3/21-3/29,after coming inwithcomplaints ofabdominal pain with nausea andvomiting. Patient was found to have a ill-defined heterogeneous mass involving the sigmoid colon of the pelvis with signs of possible perforationand abscess during the hospitalization patient was evaluated by general surgery, cardiology, urology, and orthopedic surgery. She underwent JJ stenting and laparoscopic diverting loopcolostomyby Dr. Romona Curls 3/24.She was initially discharged to a rehab facility,but has been home for a while. Since being hospitalized patient reports still having rectal bleeding. She will get the urge to have a bowel movement and when she goes notes clots of dark blood present. Patient going once or twice per day. She notes that she is just had generalized weakness and felt as though she may pass out. Denies having any loss of consciousness or recent falls. Her biggest complaint is continued left-sided hip pain that runs down her leg and into her groin area.Just 3 days ago,she had a scope by Dr. Collene Mares ofgastroenterology,but the mass was not able to be reached. Patient notes her weakness symptoms were sosevere today to the point which she was unable to get around and she came in for further evaluation.   Hospital course complicated by acute GI related blood loss secondary to cancer of which GI is unable to do anything.  Patient recently required transfusion.  Further evaluation would reveal DVTs of which patient was sent to get an IVC filter placed.  Oncology consulted after biopsy results available  and plan is for chemotherapy.     Assessment & Plan:   Principal Problem:   Rectal bleeding Active Problems:   Hypothyroid   Essential hypertension   Normocytic anemia   Left hip pain   Generalized weakness   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 (HCC)   Goals of care, counseling/discussion   Abdominal mass   Deep vein thrombosis (DVT) of left lower extremity (HCC)   Malignant neoplasm of uterus (HCC)   Pelvic mass   Acute blood loss anemia  1 pelvic mass with involvement of sigmoid colon biopsy 10/05/2017 poorly differentiated carcinoma consistent with GYN primary/metastatic uterine cancer/ rectal bleeding Patient had presented with weakness and rectal bleeding.  CT scan from 08/18/2017 showed sigmoid colon of pelvis with signs of possible perforation and abscess.  Hemoglobin is stable.  Patient status post diverting loop colostomy 08/21/2017.  Patient underwent recent scope by Dr. Collene Mares and unable to obtain tissue biopsy.  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  Patient being followed by oncology and patient transferred to the third floor to begin chemotherapy today.  Patient received initial chemotherapy with no significant complications and seem to have tolerated it..  Patient status post 2 units packed red blood cells 10/10/2017.  Hemoglobin currently stable at 10.8.  Nothing further to add per GI for bleeding.  Transfuse as needed.  Transfusion threshold hemoglobin less than 7.  Patient with poor appetite.  Start Remeron 15 mg p.o. nightly.  Per oncology.   2.  Left lower extremity DVT Likely secondary to malignancy.  Due to recent GI bleed patient not a candidate for anticoagulation at this time.  Status post IVC filter placement 10/11/2017  per interventional radiology.  Patient follow-up with oncology as patient may be need to be started  on anticoagulation in the future however will defer to oncology.  3.  Generalized weakness/failure to thrive Likely secondary to malignancy.  Supportive care.  4.  Acute blood loss anemia Prior physician discussed with Dr. Collene Mares GI who felt there was nothing to do from a GI standpoint.  Patient status post IV iron 10/10/2017.  Patient status post transfusion of 2 units packed red blood cells 10/10/2017.  Hemoglobin stable at 10.8. Continue PPI.    5.  Left hip pain Likely secondary to pelvic mass and DVT.  Left hip pain improving.  Tramadol as needed.    6.  Hypertension Continue current regimen of chlorthalidone, losartan and Cardizem.  Outpatient follow-up.  7.  Hypothyroidism TSH of 3.917.  Continue Synthroid.  8.  Hypokalemia Repleted.  Potassium at 3.5.  Magnesium at 2.5.  Follow.   9.  Paroxysmal atrial fibrillation CHA2DS2VASC score = 4 Rate controlled on Cardizem.  Patient was not placed on anticoagulation initially secondary to recent ostomy.  Patient now with rectal bleeding and likely not a candidate for anticoagulation at this time.  Outpatient follow-up with cardiology.  10.  Coronary artery disease status post CABG Stable.  Continue cardiac medications.    11.  Hx of endometrial cancer Per oncology.  #  12 constipation Patient states significant stool from ostomy after being placed on MiraLAX twice daily and Senokot as twice daily.  Patient states ostomy just emptied.  Decrease MiraLAX to daily and Senokot 2 daily.  Follow electrolytes.   DVT prophylaxis: SCDs Code Status: Full Family Communication: Updated patient and daughter at bedside.   Disposition Plan: Likely home when okay with oncology hopefully in the next 24 to 48 hours.    Consultants:   Oncology: Dr. Marin Olp 10/09/2017  Radiation oncology Dr. Dwana Curd 10/12/2017  Procedures:   CT abdomen and pelvis 10/03/2017  CT-guided biopsy of left pelvic mass per interventional radiology Dr.  Laurence Ferrari  Bilateral lower extremity edema Dopplers 10/10/2017  Ultrasound-guided placement of subcutaneous port.  Interventional radiology, Dr. Anselm Pancoast 10/11/2017  Transfused 2 units packed red blood cells 10/10/2017.  IVC filter placement per Dr. Anselm Pancoast 10/11/2017  Antimicrobials:   None   Subjective: Patient sitting up.  Patient states she had a bad night with some significant diaphoresis.  Patient stating significant ostomy output now on laxatives.  Constipation improved.  No further rectal bleeding.  Denies any chest pain or shortness of breath.  Patient with complaints of poor appetite.  Objective: Vitals:   10/14/17 0532 10/14/17 1354 10/14/17 2059 10/15/17 0444  BP: (!) 157/63 (!) 154/63 (!) 163/64 (!) 178/74  Pulse: 70 75 78 93  Resp: 17 18 20 18   Temp: 97.7 F (36.5 C) 98.1 F (36.7 C) 98.2 F (36.8 C) (!) 97.5 F (36.4 C)  TempSrc: Oral Oral Oral Oral  SpO2: 95% 97% 97% 97%  Weight: 64.4 kg (141 lb 15.7 oz)     Height:        Intake/Output Summary (Last 24 hours) at 10/15/2017 1139 Last data filed at 10/14/2017 1837 Gross per 24 hour  Intake 650 ml  Output 7 ml  Net 643 ml   Filed Weights   10/12/17 0500 10/13/17 1311 10/14/17 0532  Weight: 64.7 kg (142 lb 10.2 oz) 64.9 kg (143 lb) 64.4 kg (141 lb 15.7 oz)    Examination:  General exam: NAD Respiratory system: Lungs clear to auscultation bilaterally.  No  wheezing, no crackles, no rhonchi.  Normal respiratory effort.  No use of accessory muscles of respiration.  Cardiovascular system: RRR with 3/6 systolic ejection murmur.  No rubs or gallops.  No JVD.  No lower extremity edema.  Gastrointestinal system: Abdomen is nontender, nondistended, soft, positive bowel sounds.  Colostomy intact with minimal soft stool noted.  Central nervous system: Alert and oriented. No focal neurological deficits. Extremities: Symmetric 5 x 5 power. Skin: No rashes, lesions or ulcers Psychiatry: Judgement and insight appear normal.  Mood & affect appropriate.     Data Reviewed: I have personally reviewed following labs and imaging studies  CBC: Recent Labs  Lab 10/11/17 0541 10/12/17 0317 10/13/17 0535 10/14/17 0400 10/15/17 0500  WBC 8.3 11.2* 11.2* 11.4* 11.6*  NEUTROABS 6.2 9.1* 9.5* 11.0* 10.8*  HGB 10.9* 10.8* 10.4* 12.0 10.8*  HCT 32.9* 33.3* 32.2* 36.7 32.9*  MCV 85.0 86.0 87.0 87.0 86.4  PLT 454* 429* 398 497* 720*   Basic Metabolic Panel: Recent Labs  Lab 10/11/17 0541 10/12/17 0317 10/13/17 0535 10/14/17 0400 10/15/17 0500  NA 140 135 139 135 137  K 3.5 3.6 3.6 3.6 3.5  CL 101 100* 103 98* 99*  CO2 26 25 25 25 24   GLUCOSE 91 97 109* 159* 112*  BUN 7 6 6 10 14   CREATININE 0.71 0.71 0.66 0.68 0.62  CALCIUM 9.5 9.0 9.1 9.2 9.2  MG  --   --  1.7 2.5*  --    GFR: Estimated Creatinine Clearance: 52.2 mL/min (by C-G formula based on SCr of 0.62 mg/dL). Liver Function Tests: Recent Labs  Lab 10/09/17 1039 10/10/17 0613 10/11/17 0541 10/12/17 0317  AST 18 11* 13* 11*  ALT 7* 9* 9* 7*  ALKPHOS 66 61 67 67  BILITOT 0.5 0.3 0.8 0.6  PROT 6.9 6.7 7.2 7.1  ALBUMIN 2.8* 2.6* 2.9* 2.9*   No results for input(s): LIPASE, AMYLASE in the last 168 hours. No results for input(s): AMMONIA in the last 168 hours. Coagulation Profile: Recent Labs  Lab 10/11/17 0541  INR 1.05   Cardiac Enzymes: No results for input(s): CKTOTAL, CKMB, CKMBINDEX, TROPONINI in the last 168 hours. BNP (last 3 results) No results for input(s): PROBNP in the last 8760 hours. HbA1C: No results for input(s): HGBA1C in the last 72 hours. CBG: No results for input(s): GLUCAP in the last 168 hours. Lipid Profile: No results for input(s): CHOL, HDL, LDLCALC, TRIG, CHOLHDL, LDLDIRECT in the last 72 hours. Thyroid Function Tests: No results for input(s): TSH, T4TOTAL, FREET4, T3FREE, THYROIDAB in the last 72 hours. Anemia Panel: No results for input(s): VITAMINB12, FOLATE, FERRITIN, TIBC, IRON, RETICCTPCT in the  last 72 hours. Sepsis Labs: No results for input(s): PROCALCITON, LATICACIDVEN in the last 168 hours.  No results found for this or any previous visit (from the past 240 hour(s)).       Radiology Studies: No results found.      Scheduled Meds: . chlorthalidone  25 mg Oral Daily  . diclofenac sodium  2 g Topical QID  . diltiazem  300 mg Oral Daily  . dronabinol  2.5 mg Oral BID AC  . lactose free nutrition  237 mL Oral TID WC  . levothyroxine  50 mcg Oral QAC breakfast  . losartan  100 mg Oral Daily  . pantoprazole  40 mg Oral BID  . polyethylene glycol  17 g Oral BID  . senna-docusate  1 tablet Oral BID   Continuous Infusions: . sodium chloride    .  famotidine       LOS: 8 days    Time spent: 35 minutes    Irine Seal, MD [facility] Pager [phone]  If 7PM-7AM, please contact night-coverage [email or web address] Password [facility] 10/15/2017, 11:39 AM

## 2017-10-15 NOTE — Care Management Important Message (Signed)
Important Message  Patient Details  Name: Shannon Scott MRN: [id number] Date of Birth: Oct 08, 1940   Medicare Important Message Given:  Yes    Erenest Rasher, RN 10/15/2017, 12:29 PM

## 2017-10-15 NOTE — Care Management Note (Addendum)
Case Management Note  Patient Details  Name: Shannon Scott MRN: [id number] Date of Birth: [birth year]  Subjective/Objective:   Rectal bleed, metastatic uterine cancer                 Action/Plan: NCM spoke to pt and dtr at bedside. States she is active with [facility] for [facility]. Contacted [facility] to make aware of possible dc home today with resumption of care. Pt requesting tub bench. Contacted [facility] with new referral. Will pay for out of pocket. Price per [facility] $62. She hs RW at home.   Expected Discharge Date: 10/15/2017           Expected Discharge Plan:  [place]  In-House Referral:  NA  Discharge planning Services  CM Consult  Post Acute Care Choice:  Home Health, Resumption of Svcs/PTA Provider Choice offered to:  Patient  DME Arranged:    DME Agency:  [place] Arranged:  RN, PT, OT, Nurse's Aide [place] Agency:  [place]  Status of Service:  Completed, signed off  If discussed at [place] of Stay Meetings, dates discussed:    Additional Comments:  Erenest Rasher, RN 10/15/2017, 12:22 PM

## 2017-10-16 DIAGNOSIS — C799 Secondary malignant neoplasm of unspecified site: Secondary | ICD-10-CM

## 2017-10-16 DIAGNOSIS — I82492 Acute embolism and thrombosis of other specified deep vein of left lower extremity: Secondary | ICD-10-CM

## 2017-10-16 DIAGNOSIS — C539 Malignant neoplasm of cervix uteri, unspecified: Secondary | ICD-10-CM

## 2017-10-16 LAB — BASIC METABOLIC PANEL
Anion gap: 11 (ref 5–15)
BUN: 16 mg/dL (ref 6–20)
CO2: 25 mmol/L (ref 22–32)
Calcium: 9 mg/dL (ref 8.9–10.3)
Chloride: 97 mmol/L — ABNORMAL LOW (ref 101–111)
Creatinine, Ser: 0.73 mg/dL (ref 0.44–1.00)
GFR calc Af Amer: >60 mL/min (ref >60)
GFR calc non Af Amer: >60 mL/min (ref >60)
Glucose, Bld: 93 mg/dL (ref 65–99)
Potassium: 3.6 mmol/L (ref 3.5–5.1)
Sodium: 133 mmol/L — ABNORMAL LOW (ref 135–145)

## 2017-10-16 LAB — CBC
HCT: 35.2 % — ABNORMAL LOW (ref 36.0–46.0)
Hemoglobin: 11.3 g/dL — ABNORMAL LOW (ref 12.0–15.0)
MCH: 28 pg (ref 26.0–34.0)
MCHC: 32.1 g/dL (ref 30.0–36.0)
MCV: 87.3 fL (ref 78.0–100.0)
Platelets: 405 10*3/uL — ABNORMAL HIGH (ref 150–400)
RBC: 4.03 MIL/uL (ref 3.87–5.11)
RDW: 16 % — ABNORMAL HIGH (ref 11.5–15.5)
WBC: 9.7 10*3/uL (ref 4.0–10.5)

## 2017-10-16 MED ORDER — SENNOSIDES-DOCUSATE SODIUM 8.6-50 MG PO TABS
1.0000 | ORAL_TABLET | Freq: Two times a day (BID) | ORAL | Status: DC
Start: 2017-10-16 — End: 2020-03-26

## 2017-10-16 MED ORDER — MIRTAZAPINE 15 MG PO TABS: 15.0000 mg | ORAL_TABLET | Freq: Every day | ORAL | 0 refills | Status: DC

## 2017-10-16 MED ORDER — POTASSIUM CHLORIDE CRYS ER 20 MEQ PO TBCR
40.0000 meq | EXTENDED_RELEASE_TABLET | Freq: Once | ORAL | Status: AC
  Administered 2017-10-16: 40 meq via ORAL
  Filled 2017-10-16: qty 2

## 2017-10-16 MED ORDER — DRONABINOL 2.5 MG PO CAPS: 2.5000 mg | ORAL_CAPSULE | Freq: Two times a day (BID) | ORAL | 0 refills | Status: DC

## 2017-10-16 MED ORDER — POLYETHYLENE GLYCOL 3350 17 G PO PACK
17.0000 g | PACK | Freq: Every day | ORAL | 0 refills | Status: DC
Start: 2017-10-16 — End: 2020-03-26

## 2017-10-16 MED ORDER — CHLORTHALIDONE 25 MG PO TABS: 25.0000 mg | ORAL_TABLET | Freq: Every day | ORAL | 0 refills | Status: DC

## 2017-10-16 MED ORDER — BOOST PLUS PO LIQD: 237.0000 mL | Freq: Three times a day (TID) | ORAL | 0 refills | Status: DC

## 2017-10-16 MED ORDER — TRAMADOL HCL 50 MG PO TABS: 50.0000 mg | ORAL_TABLET | Freq: Four times a day (QID) | ORAL | 0 refills | Status: DC | PRN

## 2017-10-16 NOTE — Progress Notes (Signed)
Patient discharged to home, all discharge medications and instructions reviewed and questions answered.   

## 2017-10-16 NOTE — Discharge Summary (Addendum)
Physician Discharge Summary  Shannon Scott ZOX:[id number] DOB: January 08, 1941 DOA: 10/03/2017  PCP: Leeroy Cha, MD  Admit date: 10/03/2017 Discharge date: 10/16/2017  Time spent: 60 minutes  Recommendations for Outpatient Follow-up:  1. Patient to be discharged home with home health therapies. 2. Follow-up with Dr. Marin Olp, oncology in 1 to 2 weeks. 3. Follow-up with Leeroy Cha, MD in 2 weeks.  On follow-up patient's blood pressure need to be reassessed and further recommendations made. 4. Follow-up with Dr. Excell Seltzer general surgery 11/22/2017.   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 female with medical history significant of CAD s/p CABG, endometrial cancer, A. fib with RVR, and  hypothyroidism; who presented with complaints of generalized weakness and continued rectal bleeding.   Patient had just been recently hospitalized 3/21-3/29, after coming in with complaints of abdominal pain with nausea and vomiting.  Patient was found to have a ill-defined heterogeneous mass involving the sigmoid colon of the pelvis with signs of possible perforation and abscess during the hospitalization patient was evaluated by general surgery,  cardiology, urology, and  orthopedic surgery.  She underwent JJ stenting and laparoscopic diverting loop colostomy by Dr. Excell Seltzer on 3/24.  She was initially discharged to a rehab facility, but has been home for a while.  Since being hospitalized patient reported still having rectal bleeding.  She will get the urge to have a bowel movement and when she goes notes clots of dark blood present.  Patient going once or twice per day.  She noted that she is just had generalized weakness and felt as though she may pass out.  Denied having any loss of consciousness or recent falls.  Her biggest complaint was continued left-sided hip pain that runs down her leg and into her groin area.  Just 3 days ago, she had a scope by Dr. Collene Mares of gastroenterology, but the mass was not able to be reached.  Patient noted her weakness symptoms were so severe on the day of admission, to the point which she was unable to get around and she came in for further evaluation.  Denied having any significant chest pain, shortness of breath, loss of consciousness, fall, fever, chills, nausea, or vomiting.  ED Course: Upon admission into the emergency department the patient was found to be 164/104-196/77, and all other vitals within normal limits. Labs revealed WBC 9.7, hemoglobin 10.4, platelets 472 and other labs relatively within normal limits.  General surgery and gastroenterology were notified and recommended admission for observation overnight with likely need of repeat CT scan and interventional radiology.  [facility] called to admit.     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  CT scan from 08/18/2017 showed sigmoid colon of pelvis with signs of possible perforation and abscess.  Hemoglobin post transfusion remained stable and on day of discharge hemoglobin  was 11.3.  Patient status post diverting loop colostomy 08/21/2017.  Patient underwent recent scope by Dr. Collene Mares and unable to obtain tissue biopsy.  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  Patient being followed by oncology and patient transferred to the third floor to begin chemotherapy today.  Patient received initial chemotherapy with no significant complications and seem to have tolerated it..  Patient status post 2 units packed red blood cells 10/10/2017.  Nothing further to add per GI for bleeding. Transfusion threshold hemoglobin less than 7.  Patient with poor appetite with difficulty sleeping.  Patient had been started on Marinol.  Patient was placed on Remeron 15 mg nightly.  Outpatient follow-up.  2.  Left lower extremity DVT Patient noted to have left hip pain and significant swelling in the left lower extremity.  Doppler ultrasounds were done on 10/10/2017 which was consistent with an acute DVT in the proximal profunda vein, common femoral vein.  Evidence of acute superficial thrombosis of the great saphenous vein.  Abnormalities consistent with sequelae of prior venous obstructive process with findings that appear chronic/long-standing nature identified in the small saphenous vein.  Felt likely secondary to malignancy.  Due to recent GI bleed patient not a candidate for anticoagulation at this time.  Status post IVC filter placement 10/11/2017 per interventional radiology.  Patient will follow-up with oncology as patient may be need to be started on anticoagulation in the future however deferred to oncology.  3.  Generalized weakness/failure to thrive Likely secondary to malignancy.  Was seen by PT/OT.  Patient was discharged home with home health therapies.   4.  Acute blood loss anemia Prior physician, Dr Wendee Beavers discussed with Dr. Collene Mares GI who felt there was nothing to do from a GI  standpoint.  Patient status post IV iron 10/10/2017.  Patient status post transfusion of 2 units packed red blood cells 10/10/2017.  Hemoglobin stabilized at 11.3 by day of discharge.  She was also maintained on a PPI.    5.  Left hip pain Likely secondary to pelvic mass and DVT.  Left hip pain improving.  Tramadol as needed.    6.  Hypertension She was initially maintained on home regimen of losartan and Cardizem.  Patient's blood pressure however remained elevated and patient was resumed back on chlorthalidone which she had previously been on prior outpatient cardiology notes.  Blood pressure improved.  Outpatient follow-up with PCP and cardiology as scheduled.    7.  Hypothyroidism TSH of 3.917.    Patient continued on home regimen of Synthroid.   8.  Hypokalemia Repleted.    9.  Paroxysmal atrial fibrillation CHA2DS2VASC score = 4 Rate controlled on Cardizem.  Patient was not placed on anticoagulation initially secondary to recent ostomy.  Patient now with rectal bleeding and likely not a candidate for anticoagulation at this time.  Outpatient follow-up with cardiology.  10.  Coronary artery disease status post CABG Stable.  Continued on home regimen of cardiac medications.    11.  Hx of endometrial cancer Patient was followed by oncology throughout the hospitalization.   12 constipation Patient with complaints of constipation.  Patient was placed on MiraLAX  twice daily as well as Senokot as twice daily.  Patient noted to have significant stool out of her ostomy with clinical improvement.  Patient will be discharged home on MiraLAX daily as well as Senokot as twice daily.  Outpatient follow-up with PCP and oncology.        Procedures:  CT abdomen and pelvis 10/03/2017  CT-guided biopsy of left pelvic mass per interventional radiology Dr. Laurence Ferrari  Bilateral lower extremity edema Dopplers 10/10/2017  Ultrasound-guided placement of subcutaneous port.  Interventional  radiology, Dr. Anselm Pancoast 10/11/2017  Transfused 2 units packed red blood cells 10/10/2017.  IVC filter placement per Dr. Anselm Pancoast 10/11/2017      Consultations:  Oncology: Dr. Marin Olp 10/09/2017  Radiation oncology Dr. Dwana Curd 10/12/2017      Discharge Exam: Vitals:   10/15/17 2105 10/16/17 0507  BP: (!) 143/50 (!) 154/70  Pulse: 77 64  Resp: 14 13  Temp: 99 F (37.2 C) 97.7 F (36.5 C)  SpO2: 96% 98%    General: NAD Cardiovascular: RRR Respiratory: CTAB  Discharge Instructions   Discharge Instructions    Diet general   Complete by:  As directed    Increase activity slowly   Complete by:  As directed      Allergies as of 10/16/2017      Reactions   Statins Other (See Comments)   Myalgias and memory problems   Ciprofloxacin Itching   Splotchy redness with itching during IV infusion localized to arm.      Medication List    TAKE these medications   acetaminophen 500 MG tablet Commonly known as:  TYLENOL Take 500 mg by mouth every 6 (six) hours as needed for mild pain, moderate pain, fever or headache.   ALPRAZolam 0.5 MG tablet Commonly known as:  XANAX Take 0.5 mg by mouth 2 (two) times daily.   chlorthalidone 25 MG tablet Commonly known as:  HYGROTON Take 1 tablet (25 mg total) by mouth daily. Start taking on:  10/17/2017   diclofenac sodium 1 % Gel Commonly known as:  VOLTAREN Apply 2 g topically 4 (four) times daily.   diltiazem 300 MG 24 hr capsule Commonly known as:  TIAZAC Take 300 mg by mouth daily.   dronabinol 2.5 MG capsule Commonly known as:  MARINOL Take 1 capsule (2.5 mg total) by mouth 2 (two) times daily before lunch and supper.   lactose free nutrition Liqd Take 237 mLs by mouth 3 (three) times daily with meals.   levothyroxine 50 MCG tablet Commonly known as:  SYNTHROID, LEVOTHROID Take 50 mcg by mouth daily before breakfast.   losartan 100 MG tablet Commonly known as:  COZAAR Take 100 mg by mouth daily.   mirtazapine 15  MG tablet Commonly known as:  REMERON Take 1 tablet (15 mg total) by mouth at bedtime.   pantoprazole 40 MG tablet Commonly known as:  PROTONIX Take 1 tablet (40 mg total) by mouth daily.   polyethylene glycol packet Commonly known as:  MIRALAX / GLYCOLAX Take 17 g by mouth daily. What changed:    when to take this  reasons to take this   senna-docusate 8.6-50 MG tablet Commonly known as:  Senokot-S Take 1 tablet by mouth 2 (two) times daily.   traMADol 50 MG tablet Commonly known as:  ULTRAM Take 1 tablet (50 mg total) by mouth every 6 (six) hours as needed for moderate pain. What changed:  when to take this  Durable Medical Equipment  (From admission, onward)        Start     Ordered   10/14/17 1025  For home use only DME Tub bench  Once     10/14/17 1024     Allergies  Allergen Reactions  . Statins Other (See Comments)    Myalgias and memory problems  . Ciprofloxacin Itching    Splotchy redness with itching during IV infusion localized to arm.   Follow-up Information    Excell Seltzer, MD Follow up on 11/22/2017.   Specialty:  General Surgery Why:  Your appointment is at 2 PM, be at the office 30 minutes early for check in. Bring photo ID and insurance information.   Contact information: [address] [place] [address] [phone]        [facility] Follow up.   Specialty:  Home Health Services Why:  Home Health RN, Physical Therapy, Occupational Therapy, aide- agency will call to arrange initial visit Contact information: [address] [place] [address] [phone]        Volanda Napoleon, MD. Schedule an appointment as soon as possible for a visit in 1 week(s).   Specialty:  Oncology Why:  f/u in 1 week. Contact information: [address] [place] [address] [phone]        Leeroy Cha, MD. Schedule an appointment as soon as possible for a  visit in 2 week(s).   Specialty:  Internal Medicine Contact information: [address] [address] [phone]        Pixie Casino, MD .   Specialty:  Cardiology Contact information: [place] [address] [phone]            The results of significant diagnostics from this hospitalization (including imaging, microbiology, ancillary and laboratory) are listed below for reference.    Significant Diagnostic Studies: Ct Abdomen Pelvis W Contrast  Result Date: 10/03/2017 CLINICAL DATA:  Rectal bleeding.  Malaise. HPIPt had surgery 1 month ago associated with a pelvic /colonic mass. Pt had a diverting colostomy. She did not have the mass removed. Pt states since then she continues to have pain in the pelvic region . She also is having blood from her rectum ever since the surgery but it seems to be getting worse and the bleeding is more severe. She has been seeing her surgeons and She had a flexible sigmoidoscopy on Friday but they did not determine the cause. Pt is concerned because it is taking long to figure out what is going on. They still dont have a tissue diagnosis. Pt called the surgeon today and was told to come to the ED. Pt feels like she is getting worse. PT did not take any of her medications today. EXAM: CT ABDOMEN AND PELVIS WITH CONTRAST TECHNIQUE: Multidetector CT imaging of the abdomen and pelvis was performed using the standard protocol following bolus administration of intravenous contrast. CONTRAST:  182m ISOVUE-300 IOPAMIDOL (ISOVUE-300) INJECTION 61% COMPARISON:  08/18/2017 FINDINGS: Lower chest: 4 mm pleural-based nodular opacity in the lateral left lower lobe, image 3, series 4, above the field of view from the most recent prior study. Present and similar in size on a CT dated 01/14/2016 consistent with a benign nodule. No acute findings. Heart normal in size. Hepatobiliary: No liver masses. Focal fat noted  adjacent to the falciform ligament. No other lesions. Liver normal in size. A few  small dependent densities are noted in the gallbladder consistent with stones. No wall thickening or inflammation. No bile duct dilation. Pancreas: Unremarkable. No pancreatic ductal dilatation or surrounding inflammatory changes. Spleen: Normal in size without focal abnormality. Adrenals/Urinary Tract: No adrenal masses. There is mild-to-moderate left renal collecting system dilation. A stent extends from the left renal pelvis through the left ureter to the bladder, well positioned. No right hydronephrosis. Small, 11 mm, lower pole renal cyst. No other renal masses. No intrarenal stones. Right ureters normal in course and in caliber. Bladder is deviated by the large pelvic mass, but otherwise unremarkable. Stomach/Bowel: A large heterogeneous mass arises from the sigmoid colon and extends to the pelvic sidewall involving the ileo psoas muscle. Mass measures approximately 6.9 cm from superior to inferior by 7 cm x 5.5 cm transversely. This has increased in size from the prior exam. No other colonic masses. There are adjacent inflammatory changes and several prominent, subcentimeter lymph nodes. Multiple diverticula are noted along the colon, stable. A diverting loop colostomy has been performed in utilizing the mid transverse colon. Stomach shows a masslike area arising from the cardia extending to the gastroesophageal junction similar to the prior exam. This may be from previous hiatal hernia surgery or reflect a prominent fold. It is felt unlikely reflect neoplastic disease since it is stable when compared to 01/14/2016 CT. Small bowel is unremarkable. Vascular/Lymphatic: There are few prominent to mildly enlarged pelvic and retroperitoneal lymph nodes. Largest measuring 1 cm lying to the left of the aortic bifurcation. This node is increased in size from the prior CT where it measured 6 mm. Reproductive: Uterus is surgically absent.  Other: Hazy inflammatory change is noted in the pelvis that has increased from the prior exam. No ascites. Musculoskeletal: No fracture or acute finding. No osteoblastic or osteolytic lesions. IMPRESSION: 1. No acute findings. 2. 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 It extends to involve the left pelvic sidewall, specifically the ileo psoas muscle. There are now inflammatory type changes adjacent to the mass that have increased from prior exam. There are multiple prominent to borderline enlarged lymph nodes several increased in size from the prior exam. No other evidence of metastatic disease. 3. Loop colostomy lies in transverse colon has been performed since the prior study. No evidence of bowel obstruction. 4. There is left hydronephrosis despite a well-positioned left ureteral stent. No right hydronephrosis. 5. Few small gallstones.  Aortic atherosclerosis. 6. Masslike prominence in the gastric cardia is most likely a prominent fold. This could be from previous hiatal hernia surgery if this has been performed in the past. The appearance of this portion of the stomach is stable from prior CTs dating back to August 2017. Electronically Signed   By: Lajean Manes M.D.   On: 10/03/2017 21:33   Ir Ivc Filter Plmt / S&i /img Guid/mod Sed  Result Date: 10/11/2017 INDICATION: 77 year old with 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 Patient has a DVT in the left lower extremity. Patient has recent GI bleeding and she is not a candidate for anticoagulation at this time. Plan for placement of an IVC filter. EXAM: IVC FILTER PLACEMENT; IVC VENOGRAM; ULTRASOUND FOR VASCULAR ACCESS Physician: Stephan Minister. Anselm Pancoast, MD MEDICATIONS: None. ANESTHESIA/SEDATION: 1 mg Versed. This procedure was performed immediately following the Port-A-Cath placement. The patient was continuously monitored during the procedure by the interventional radiology nurse under my direct supervision. CONTRAST:  80 mL Isovue-300  FLUOROSCOPY TIME:  Fluoroscopy Time: 1 minutes 30 seconds (28 mGy).  COMPLICATIONS: None immediate. PROCEDURE: The procedure was explained to the patient. The risks and benefits of the procedure were discussed and the patient's questions were addressed. Informed consent was obtained from the patient. Ultrasound demonstrated a patent right common femoral vein. Ultrasound images were obtained for documentation. The right groin was prepped and draped in a sterile fashion. Maximal barrier sterile technique was utilized including caps, mask, sterile gowns, sterile gloves, sterile drape, hand hygiene and skin antiseptic. The skin was anesthetized with 1% lidocaine. A 21 gauge needle was directed into the vein with ultrasound guidance and a micropuncture dilator set was placed. A wire was advanced into the IVC. The filter sheath was advanced over the wire into the IVC. An IVC venogram was performed. Fluoroscopic images were obtained for documentation. A Bard Denali filter was deployed below the lowest renal vein. A follow-up venogram was performed and the vascular sheath was removed with manual compression. FINDINGS: IVC was patent. Bilateral renal veins were identified. The filter was deployed below the lowest renal vein. Follow-up venogram confirmed placement within the IVC and below the renal veins. IMPRESSION: Successful placement of a retrievable IVC filter. PLAN: Due to patient related comorbidities and/or clinical necessity, this IVC filter should be considered a permanent device. This patient will not be actively followed for future filter retrieval. Electronically Signed   By: Markus Daft M.D.   On: 10/11/2017 14:55   Ir US Guide Vasc Access Right  Addendum Date: 10/11/2017   ADDENDUM REPORT: 10/11/2017 17:32 ADDENDUM: The port was secured to the chest wall with suture. Electronically Signed   By: Markus Daft M.D.   On: 10/11/2017 17:32   Result Date: 10/11/2017 INDICATION: 77 year old with history of uterine  cancer. Recently biopsied pelvic mass demonstrates a poorly differentiated carcinoma. Patient needs Port-A-Cath for chemotherapy. EXAM: FLUOROSCOPIC AND ULTRASOUND GUIDED PLACEMENT OF A SUBCUTANEOUS PORT COMPARISON:  None. MEDICATIONS: Ancef 2 g; The antibiotic was administered within an appropriate time interval prior to skin puncture. ANESTHESIA/SEDATION: Versed 2.0 mg IV; Fentanyl 100 mcg IV; Moderate Sedation Time:  35 minutes The patient was continuously monitored during the procedure by the interventional radiology nurse under my direct supervision. FLUOROSCOPY TIME:  54 seconds, 3 mGy COMPLICATIONS: None immediate. PROCEDURE: The procedure, risks, benefits, and alternatives were explained to the patient. Questions regarding the procedure were encouraged and answered. The patient understands and consents to the procedure. Patient was placed supine on the interventional table. Ultrasound confirmed a patent right internal jugular vein. The right chest and neck were cleaned with a skin antiseptic and a sterile drape was placed. Maximal barrier sterile technique was utilized including caps, mask, sterile gowns, sterile gloves, sterile drape, hand hygiene and skin antiseptic. The right neck was anesthetized with 1% lidocaine. Small incision was made in the right neck with a blade. Micropuncture set was placed in the right internal jugular vein with ultrasound guidance. The micropuncture wire was used for measurement purposes. The right chest was anesthetized with 1% lidocaine with epinephrine. #15 blade was used to make an incision and a subcutaneous port pocket was formed. [place] was assembled. Subcutaneous tunnel was formed with a stiff tunneling device. The port catheter was brought through the subcutaneous tunnel. The port was placed in the subcutaneous pocket. The micropuncture set was exchanged for a peel-away sheath. The catheter was placed through the peel-away sheath and the tip was positioned  near the superior cavoatrial junction. Catheter placement was confirmed with fluoroscopy. The port was accessed and flushed with  saline. The port pocket was closed using two layers of absorbable sutures and Dermabond. The vein skin site was closed using a single layer of absorbable suture and Dermabond. Sterile dressings were applied. Patient tolerated the procedure well without an immediate complication. Ultrasound confirmed a patent right internal jugular vein. Ultrasound was used for vascular guidance. Ultrasound image was saved for documentation. Fluoroscopic images were taken and saved for this procedure. IMPRESSION: Placement of a subcutaneous port device. This is a CT injectable Port-A-Cath. Electronically Signed: By: Markus Daft M.D. On: 10/11/2017 14:49   Ct Biopsy  Result Date: 10/05/2017 INDICATION: 77 year old female with a history of locally invasive exophytic sigmoid colonic mass highly concerning for adenocarcinoma. She has not undergone prior biopsy and presents for CT-guided biopsy to obtain tissue diagnosis. EXAM: CT guided tissue biopsy of left pelvic mass MEDICATIONS: None. ANESTHESIA/SEDATION: Moderate (conscious) sedation was employed during this procedure. A total of Versed 2 mg and Fentanyl 100 mcg was administered intravenously. Moderate Sedation Time: 12 minutes. The patient's level of consciousness and vital signs were monitored continuously by radiology nursing throughout the procedure under my direct supervision. FLUOROSCOPY TIME:  Fluoroscopy Time: 0 minutes 0 seconds (0 mGy). COMPLICATIONS: None immediate. PROCEDURE: Informed written consent was obtained from the patient after a thorough discussion of the procedural risks, benefits and alternatives. All questions were addressed. A timeout was performed prior to the initiation of the procedure. A planning axial CT scan was performed. The exophytic mass was identified in the left lower quadrant. A suitable skin entry site was selected  medial to the inferior epigastric vessels. The overlying skin was prepped and marked. Under intermittent CT guidance, a 17 gauge introducer needle was advanced into the margin of the mass. Multiple 18 gauge core biopsies were then coaxially obtained and placed in formalin before being delivered to pathology for further analysis. The introducer needle was removed. Post biopsy axial CT imaging demonstrates no evidence of immediate complication. Patient tolerated the procedure well. IMPRESSION: Technically successful CT-guided biopsy of left pelvic mass. Electronically Signed   By: Jacqulynn Cadet M.D.   On: 10/05/2017 09:21   Ir Fluoro Guide Port Insertion Right  Addendum Date: 10/11/2017   ADDENDUM REPORT: 10/11/2017 17:32 ADDENDUM: The port was secured to the chest wall with suture. Electronically Signed   By: Markus Daft M.D.   On: 10/11/2017 17:32   Result Date: 10/11/2017 INDICATION: 77 year old with history of uterine cancer. Recently biopsied pelvic mass demonstrates a poorly differentiated carcinoma. Patient needs Port-A-Cath for chemotherapy. EXAM: FLUOROSCOPIC AND ULTRASOUND GUIDED PLACEMENT OF A SUBCUTANEOUS PORT COMPARISON:  None. MEDICATIONS: Ancef 2 g; The antibiotic was administered within an appropriate time interval prior to skin puncture. ANESTHESIA/SEDATION: Versed 2.0 mg IV; Fentanyl 100 mcg IV; Moderate Sedation Time:  35 minutes The patient was continuously monitored during the procedure by the interventional radiology nurse under my direct supervision. FLUOROSCOPY TIME:  54 seconds, 3 mGy COMPLICATIONS: None immediate. PROCEDURE: The procedure, risks, benefits, and alternatives were explained to the patient. Questions regarding the procedure were encouraged and answered. The patient understands and consents to the procedure. Patient was placed supine on the interventional table. Ultrasound confirmed a patent right internal jugular vein. The right chest and neck were cleaned with a skin  antiseptic and a sterile drape was placed. Maximal barrier sterile technique was utilized including caps, mask, sterile gowns, sterile gloves, sterile drape, hand hygiene and skin antiseptic. The right neck was anesthetized with 1% lidocaine. Small incision was made in the right  neck with a blade. Micropuncture set was placed in the right internal jugular vein with ultrasound guidance. The micropuncture wire was used for measurement purposes. The right chest was anesthetized with 1% lidocaine with epinephrine. #15 blade was used to make an incision and a subcutaneous port pocket was formed. [place] was assembled. Subcutaneous tunnel was formed with a stiff tunneling device. The port catheter was brought through the subcutaneous tunnel. The port was placed in the subcutaneous pocket. The micropuncture set was exchanged for a peel-away sheath. The catheter was placed through the peel-away sheath and the tip was positioned near the superior cavoatrial junction. Catheter placement was confirmed with fluoroscopy. The port was accessed and flushed with saline. The port pocket was closed using two layers of absorbable sutures and Dermabond. The vein skin site was closed using a single layer of absorbable suture and Dermabond. Sterile dressings were applied. Patient tolerated the procedure well without an immediate complication. Ultrasound confirmed a patent right internal jugular vein. Ultrasound was used for vascular guidance. Ultrasound image was saved for documentation. Fluoroscopic images were taken and saved for this procedure. IMPRESSION: Placement of a subcutaneous port device. This is a CT injectable Port-A-Cath. Electronically Signed: By: Markus Daft M.D. On: 10/11/2017 14:49    Microbiology: No results found for this or any previous visit (from the past 240 hour(s)).   Labs: Basic Metabolic Panel: Recent Labs  Lab 10/12/17 0317 10/13/17 0535 10/14/17 0400 10/15/17 0500 10/16/17 0514  NA  135 139 135 137 133*  K 3.6 3.6 3.6 3.5 3.6  CL 100* 103 98* 99* 97*  CO2 25 25 25 24 25   GLUCOSE 97 109* 159* 112* 93  BUN 6 6 10 14 16   CREATININE 0.71 0.66 0.68 0.62 0.73  CALCIUM 9.0 9.1 9.2 9.2 9.0  MG  --  1.7 2.5*  --   --    Liver Function Tests: Recent Labs  Lab 10/10/17 0613 10/11/17 0541 10/12/17 0317  AST 11* 13* 11*  ALT 9* 9* 7*  ALKPHOS 61 67 67  BILITOT 0.3 0.8 0.6  PROT 6.7 7.2 7.1  ALBUMIN 2.6* 2.9* 2.9*   No results for input(s): LIPASE, AMYLASE in the last 168 hours. No results for input(s): AMMONIA in the last 168 hours. CBC: Recent Labs  Lab 10/11/17 0541 10/12/17 0317 10/13/17 0535 10/14/17 0400 10/15/17 0500 10/16/17 0514  WBC 8.3 11.2* 11.2* 11.4* 11.6* 9.7  NEUTROABS 6.2 9.1* 9.5* 11.0* 10.8*  --   HGB 10.9* 10.8* 10.4* 12.0 10.8* 11.3*  HCT 32.9* 33.3* 32.2* 36.7 32.9* 35.2*  MCV 85.0 86.0 87.0 87.0 86.4 87.3  PLT 454* 429* 398 497* 408* 405*   Cardiac Enzymes: No results for input(s): CKTOTAL, CKMB, CKMBINDEX, TROPONINI in the last 168 hours. BNP: BNP (last 3 results) No results for input(s): BNP in the last 8760 hours.  ProBNP (last 3 results) No results for input(s): PROBNP in the last 8760 hours.  CBG: No results for input(s): GLUCAP in the last 168 hours.     Signed:  Irine Seal MD.  [facility] 10/16/2017, 1:18 PM

## 2017-10-17 ENCOUNTER — Ambulatory Visit: Payer: Medicare Other

## 2017-10-17 ENCOUNTER — Encounter: Payer: Self-pay | Admitting: Gynecologic Oncology

## 2017-10-17 ENCOUNTER — Ambulatory Visit
Admission: RE | Admit: 2017-10-17 | Discharge: 2017-10-17 | Disposition: A | Discharge status: Home / Self Care | Payer: Medicare Other | Source: Ambulatory Visit | Attending: Radiation Oncology | Admitting: Radiation Oncology

## 2017-10-17 DIAGNOSIS — Z510 Encounter for antineoplastic radiation therapy: Secondary | ICD-10-CM | POA: Diagnosis not present

## 2017-10-17 DIAGNOSIS — C541 Malignant neoplasm of endometrium: Secondary | ICD-10-CM | POA: Diagnosis not present

## 2017-10-17 NOTE — Progress Notes (Signed)
Gynecologic Oncology Multi-Disciplinary Disposition Conference Note  Date of the Conference: Oct 17, 2017  Patient Name: Shannon Scott   Primary GYN Oncologist: Dr. Alycia Rossetti  Stage/Disposition:  Recurrent endometrial cancer. Disposition is to palliative radiation and chemotherapy.  MSI testing on recent biopsy [id number].   This Multidisciplinary conference took place involving physicians from [place], [place], Radiation Oncology, Pathology, Radiology along with the Gynecologic Oncology Nurse Practitioner and RN.  Comprehensive assessment of the patient's malignancy, staging, need for surgery, chemotherapy, radiation therapy, and need for further testing were reviewed. Supportive measures, both inpatient and following discharge were also discussed. The recommended plan of care is documented. Greater than 35 minutes were spent correlating and coordinating this patient's care.

## 2017-10-18 ENCOUNTER — Ambulatory Visit
Admission: RE | Admit: 2017-10-18 | Discharge: 2017-10-18 | Disposition: A | Discharge status: Home / Self Care | Payer: Medicare Other | Source: Ambulatory Visit | Attending: Radiation Oncology | Admitting: Radiation Oncology

## 2017-10-18 ENCOUNTER — Ambulatory Visit: Payer: Medicare Other

## 2017-10-18 DIAGNOSIS — C541 Malignant neoplasm of endometrium: Secondary | ICD-10-CM | POA: Diagnosis not present

## 2017-10-19 ENCOUNTER — Ambulatory Visit
Admission: RE | Admit: 2017-10-19 | Discharge: 2017-10-19 | Disposition: A | Discharge status: Home / Self Care | Payer: Medicare Other | Source: Ambulatory Visit | Attending: Radiation Oncology | Admitting: Radiation Oncology

## 2017-10-19 ENCOUNTER — Ambulatory Visit: Payer: Medicare Other

## 2017-10-19 ENCOUNTER — Other Ambulatory Visit: Payer: Self-pay | Admitting: Family

## 2017-10-19 DIAGNOSIS — C541 Malignant neoplasm of endometrium: Secondary | ICD-10-CM

## 2017-10-19 DIAGNOSIS — D508 Other iron deficiency anemias: Secondary | ICD-10-CM

## 2017-10-20 ENCOUNTER — Inpatient Hospital Stay: Payer: Medicare Other

## 2017-10-20 ENCOUNTER — Inpatient Hospital Stay: Payer: Medicare Other | Attending: Hematology & Oncology

## 2017-10-20 ENCOUNTER — Other Ambulatory Visit: Payer: Self-pay | Admitting: Family

## 2017-10-20 ENCOUNTER — Other Ambulatory Visit: Payer: Self-pay | Admitting: Oncology

## 2017-10-20 ENCOUNTER — Ambulatory Visit
Admission: RE | Admit: 2017-10-20 | Discharge: 2017-10-20 | Disposition: A | Discharge status: Home / Self Care | Payer: Medicare Other | Source: Ambulatory Visit | Attending: Radiation Oncology | Admitting: Radiation Oncology

## 2017-10-20 ENCOUNTER — Other Ambulatory Visit: Payer: Self-pay

## 2017-10-20 ENCOUNTER — Ambulatory Visit: Payer: Medicare Other

## 2017-10-20 ENCOUNTER — Inpatient Hospital Stay (HOSPITAL_BASED_OUTPATIENT_CLINIC_OR_DEPARTMENT_OTHER): Payer: Medicare Other | Admitting: Hematology & Oncology

## 2017-10-20 ENCOUNTER — Encounter: Payer: Self-pay | Admitting: Hematology & Oncology

## 2017-10-20 VITALS — BP 129/45 | HR 83 | Temp 98.6°F | Resp 18 | Wt 139.0 lb

## 2017-10-20 DIAGNOSIS — K922 Gastrointestinal hemorrhage, unspecified: Secondary | ICD-10-CM

## 2017-10-20 DIAGNOSIS — Z923 Personal history of irradiation: Secondary | ICD-10-CM

## 2017-10-20 DIAGNOSIS — Z79899 Other long term (current) drug therapy: Secondary | ICD-10-CM | POA: Insufficient documentation

## 2017-10-20 DIAGNOSIS — R5383 Other fatigue: Secondary | ICD-10-CM | POA: Insufficient documentation

## 2017-10-20 DIAGNOSIS — C541 Malignant neoplasm of endometrium: Secondary | ICD-10-CM

## 2017-10-20 DIAGNOSIS — I10 Essential (primary) hypertension: Secondary | ICD-10-CM | POA: Diagnosis not present

## 2017-10-20 DIAGNOSIS — I2510 Atherosclerotic heart disease of native coronary artery without angina pectoris: Secondary | ICD-10-CM | POA: Insufficient documentation

## 2017-10-20 DIAGNOSIS — E039 Hypothyroidism, unspecified: Secondary | ICD-10-CM | POA: Insufficient documentation

## 2017-10-20 DIAGNOSIS — K921 Melena: Secondary | ICD-10-CM | POA: Diagnosis not present

## 2017-10-20 DIAGNOSIS — R531 Weakness: Secondary | ICD-10-CM | POA: Diagnosis not present

## 2017-10-20 DIAGNOSIS — M129 Arthropathy, unspecified: Secondary | ICD-10-CM | POA: Diagnosis not present

## 2017-10-20 DIAGNOSIS — D508 Other iron deficiency anemias: Secondary | ICD-10-CM

## 2017-10-20 DIAGNOSIS — R197 Diarrhea, unspecified: Secondary | ICD-10-CM | POA: Diagnosis not present

## 2017-10-20 DIAGNOSIS — Z5111 Encounter for antineoplastic chemotherapy: Secondary | ICD-10-CM | POA: Insufficient documentation

## 2017-10-20 DIAGNOSIS — R110 Nausea: Secondary | ICD-10-CM | POA: Diagnosis not present

## 2017-10-20 DIAGNOSIS — R252 Cramp and spasm: Secondary | ICD-10-CM | POA: Diagnosis not present

## 2017-10-20 DIAGNOSIS — C799 Secondary malignant neoplasm of unspecified site: Principal | ICD-10-CM

## 2017-10-20 DIAGNOSIS — D62 Acute posthemorrhagic anemia: Secondary | ICD-10-CM

## 2017-10-20 DIAGNOSIS — C539 Malignant neoplasm of cervix uteri, unspecified: Secondary | ICD-10-CM

## 2017-10-20 DIAGNOSIS — E785 Hyperlipidemia, unspecified: Secondary | ICD-10-CM | POA: Insufficient documentation

## 2017-10-20 DIAGNOSIS — R634 Abnormal weight loss: Secondary | ICD-10-CM | POA: Insufficient documentation

## 2017-10-20 LAB — CBC WITH DIFFERENTIAL (CANCER CENTER ONLY)
Basophils Absolute: 0 10*3/uL (ref 0.0–0.1)
Basophils Relative: 0 %
Eosinophils Absolute: 0.2 10*3/uL (ref 0.0–0.5)
Eosinophils Relative: 3 %
HCT: 32.4 % — ABNORMAL LOW (ref 34.8–46.6)
Hemoglobin: 10.8 g/dL — ABNORMAL LOW (ref 11.6–15.9)
Lymphocytes Relative: 3 %
Lymphs Abs: 0.2 10*3/uL — ABNORMAL LOW (ref 0.9–3.3)
MCH: 28.9 pg (ref 26.0–34.0)
MCHC: 33.3 g/dL (ref 32.0–36.0)
MCV: 86.6 fL (ref 81.0–101.0)
Monocytes Absolute: 0.6 10*3/uL (ref 0.1–0.9)
Monocytes Relative: 9 %
Neutro Abs: 5.9 10*3/uL (ref 1.5–6.5)
Neutrophils Relative %: 85 %
Platelet Count: 367 10*3/uL (ref 145–400)
RBC: 3.74 MIL/uL (ref 3.70–5.32)
RDW: 15.6 % (ref 11.1–15.7)
WBC Count: 6.8 10*3/uL (ref 3.9–10.0)

## 2017-10-20 LAB — CMP (CANCER CENTER ONLY)
ALT: 16 U/L (ref 10–47)
AST: 21 U/L (ref 11–38)
Albumin: 3.1 g/dL — ABNORMAL LOW (ref 3.5–5.0)
Alkaline Phosphatase: 71 U/L (ref 26–84)
Anion gap: 5 (ref 5–15)
BUN: 24 mg/dL — ABNORMAL HIGH (ref 7–22)
CO2: 28 mmol/L (ref 18–33)
Calcium: 9.5 mg/dL (ref 8.0–10.3)
Chloride: 96 mmol/L — ABNORMAL LOW (ref 98–108)
Creatinine: 0.7 mg/dL (ref 0.60–1.20)
Glucose, Bld: 124 mg/dL — ABNORMAL HIGH (ref 73–118)
Potassium: 3.8 mmol/L (ref 3.3–4.7)
Sodium: 129 mmol/L (ref 128–145)
Total Bilirubin: 0.6 mg/dL (ref 0.2–1.6)
Total Protein: 7.3 g/dL (ref 6.4–8.1)

## 2017-10-20 LAB — RETICULOCYTES
RBC.: 3.77 MIL/uL (ref 3.70–5.45)
Retic Count, Absolute: 18.9 10*3/uL — ABNORMAL LOW (ref 33.7–90.7)
Retic Ct Pct: 0.5 % — ABNORMAL LOW (ref 0.7–2.1)

## 2017-10-20 LAB — FERRITIN: Ferritin: 1846 ng/mL — ABNORMAL HIGH (ref 9–269)

## 2017-10-20 LAB — IRON AND TIBC
Iron: 33 ug/dL — ABNORMAL LOW (ref 41–142)
Saturation Ratios: 17 % — ABNORMAL LOW (ref 21–57)
TIBC: 191 ug/dL — ABNORMAL LOW (ref 236–444)
UIBC: 158 ug/dL

## 2017-10-20 LAB — SAMPLE TO BLOOD BANK

## 2017-10-20 MED ORDER — PALONOSETRON HCL INJECTION 0.25 MG/5ML
INTRAVENOUS | Status: AC
  Filled 2017-10-20: qty 5

## 2017-10-20 MED ORDER — PALONOSETRON HCL INJECTION 0.25 MG/5ML
0.2500 mg | Freq: Once | INTRAVENOUS | Status: AC
  Administered 2017-10-20: 0.25 mg via INTRAVENOUS

## 2017-10-20 MED ORDER — FAMOTIDINE IN NACL 20-0.9 MG/50ML-% IV SOLN
INTRAVENOUS | Status: AC
  Filled 2017-10-20: qty 50

## 2017-10-20 MED ORDER — SODIUM CHLORIDE 0.9 % IJ SOLN
10.0000 mL | Freq: Once | INTRAMUSCULAR | Status: AC
  Administered 2017-10-20: 10 mL
  Filled 2017-10-20: qty 10

## 2017-10-20 MED ORDER — ONDANSETRON HCL 8 MG PO TABS: 8.0000 mg | ORAL_TABLET | Freq: Three times a day (TID) | ORAL | 4 refills | Status: DC | PRN

## 2017-10-20 MED ORDER — PROCHLORPERAZINE MALEATE 10 MG PO TABS: 10.0000 mg | ORAL_TABLET | Freq: Four times a day (QID) | ORAL | 3 refills | Status: DC | PRN

## 2017-10-20 MED ORDER — DIPHENHYDRAMINE HCL 50 MG/ML IJ SOLN
50.0000 mg | Freq: Once | INTRAMUSCULAR | Status: AC
  Administered 2017-10-20: 50 mg via INTRAVENOUS

## 2017-10-20 MED ORDER — SODIUM CHLORIDE 0.9 % IV SOLN
45.0000 mg/m2 | Freq: Once | INTRAVENOUS | Status: AC
  Administered 2017-10-20: 72 mg via INTRAVENOUS
  Filled 2017-10-20: qty 12

## 2017-10-20 MED ORDER — FAMOTIDINE IN NACL 20-0.9 MG/50ML-% IV SOLN
20.0000 mg | Freq: Once | INTRAVENOUS | Status: AC
  Administered 2017-10-20: 20 mg via INTRAVENOUS

## 2017-10-20 MED ORDER — PB-HYOSCY-ATROPINE-SCOPOLAMINE 16.2 MG/5ML PO ELIX: 5.0000 mL | ORAL_SOLUTION | Freq: Four times a day (QID) | ORAL | 3 refills | Status: DC

## 2017-10-20 MED ORDER — TRAMADOL HCL 50 MG PO TABS: 50.0000 mg | ORAL_TABLET | Freq: Four times a day (QID) | ORAL | 0 refills | Status: DC | PRN

## 2017-10-20 MED ORDER — HEPARIN SOD (PORK) LOCK FLUSH 100 UNIT/ML IV SOLN
500.0000 [IU] | Freq: Once | INTRAVENOUS | Status: AC | PRN
  Administered 2017-10-20: 500 [IU]
  Filled 2017-10-20: qty 5

## 2017-10-20 MED ORDER — LIDOCAINE-PRILOCAINE 2.5-2.5 % EX CREA: 1.0000 "application " | TOPICAL_CREAM | CUTANEOUS | 6 refills | Status: DC | PRN

## 2017-10-20 MED ORDER — DRONABINOL 2.5 MG PO CAPS: 2.5000 mg | ORAL_CAPSULE | Freq: Two times a day (BID) | ORAL | 0 refills | Status: DC

## 2017-10-20 MED ORDER — SODIUM CHLORIDE 0.9% FLUSH
10.0000 mL | INTRAVENOUS | Status: DC | PRN
  Administered 2017-10-20: 10 mL
  Filled 2017-10-20: qty 10

## 2017-10-20 MED ORDER — DIPHENHYDRAMINE HCL 50 MG/ML IJ SOLN
INTRAMUSCULAR | Status: AC
  Filled 2017-10-20: qty 1

## 2017-10-20 MED ORDER — SODIUM CHLORIDE 0.9 % IV SOLN
Freq: Once | INTRAVENOUS | Status: AC
  Administered 2017-10-20: 09:00:00 via INTRAVENOUS

## 2017-10-20 MED ORDER — SODIUM CHLORIDE 0.9 % IV SOLN
148.0000 mg | INTRAVENOUS | Status: DC
  Administered 2017-10-20: 150 mg via INTRAVENOUS
  Filled 2017-10-20: qty 15

## 2017-10-20 MED ORDER — SODIUM CHLORIDE 0.9 % IV SOLN
20.0000 mg | Freq: Once | INTRAVENOUS | Status: AC
  Administered 2017-10-20: 20 mg via INTRAVENOUS
  Filled 2017-10-20: qty 2

## 2017-10-20 NOTE — Patient Instructions (Signed)
Implanted Port Home Guide An implanted port is a type of central line that is placed under the skin. Central lines are used to provide IV access when treatment or nutrition needs to be given through a person's veins. Implanted ports are used for long-term IV access. An implanted port may be placed because:  You need IV medicine that would be irritating to the small veins in your hands or arms.  You need long-term IV medicines, such as antibiotics.  You need IV nutrition for a long period.  You need frequent blood draws for lab tests.  You need dialysis.  Implanted ports are usually placed in the chest area, but they can also be placed in the upper arm, the abdomen, or the leg. An implanted port has two main parts:  Reservoir. The reservoir is round and will appear as a small, raised area under your skin. The reservoir is the part where a needle is inserted to give medicines or draw blood.  Catheter. The catheter is a thin, flexible tube that extends from the reservoir. The catheter is placed into a large vein. Medicine that is inserted into the reservoir goes into the catheter and then into the vein.  How will I care for my incision site? Do not get the incision site wet. Bathe or shower as directed by your health care provider. How is my port accessed? Special steps must be taken to access the port:  Before the port is accessed, a numbing cream can be placed on the skin. This helps numb the skin over the port site.  Your health care provider uses a sterile technique to access the port. ? Your health care provider must put on a mask and sterile gloves. ? The skin over your port is cleaned carefully with an antiseptic and allowed to dry. ? The port is gently pinched between sterile gloves, and a needle is inserted into the port.  Only "non-coring" port needles should be used to access the port. Once the port is accessed, a blood return should be checked. This helps ensure that the port  is in the vein and is not clogged.  If your port needs to remain accessed for a constant infusion, a clear (transparent) bandage will be placed over the needle site. The bandage and needle will need to be changed every week, or as directed by your health care provider.  Keep the bandage covering the needle clean and dry. Do not get it wet. Follow your health care provider's instructions on how to take a shower or bath while the port is accessed.  If your port does not need to stay accessed, no bandage is needed over the port.  What is flushing? Flushing helps keep the port from getting clogged. Follow your health care provider's instructions on how and when to flush the port. Ports are usually flushed with saline solution or a medicine called heparin. The need for flushing will depend on how the port is used.  If the port is used for intermittent medicines or blood draws, the port will need to be flushed: ? After medicines have been given. ? After blood has been drawn. ? As part of routine maintenance.  If a constant infusion is running, the port may not need to be flushed.  How long will my port stay implanted? The port can stay in for as long as your health care provider thinks it is needed. When it is time for the port to come out, surgery will be   done to remove it. The procedure is similar to the one performed when the port was put in. When should I seek immediate medical care? When you have an implanted port, you should seek immediate medical care if:  You notice a bad smell coming from the incision site.  You have swelling, redness, or drainage at the incision site.  You have more swelling or pain at the port site or the surrounding area.  You have a fever that is not controlled with medicine.  This information is not intended to replace advice given to you by your health care provider. Make sure you discuss any questions you have with your health care provider. Document  Released: 05/17/2005 Document Revised: 10/23/2015 Document Reviewed: 01/22/2013 Elsevier Interactive Patient Education  2017 Elsevier Inc.  

## 2017-10-20 NOTE — Patient Instructions (Signed)
[facility] Discharge Instructions for Patients Receiving Chemotherapy  Today you received the following chemotherapy agents Taxol & Carboplatin  To help prevent nausea and vomiting after your treatment, we encourage you to take your nausea medication as prescribed. If you develop nausea and vomiting that is not controlled by your nausea medication, call the clinic.   BELOW ARE SYMPTOMS THAT SHOULD BE REPORTED IMMEDIATELY:  *FEVER GREATER THAN 100.5 F  *CHILLS WITH OR WITHOUT FEVER  NAUSEA AND VOMITING THAT IS NOT CONTROLLED WITH YOUR NAUSEA MEDICATION  *UNUSUAL SHORTNESS OF BREATH  *UNUSUAL BRUISING OR BLEEDING  TENDERNESS IN MOUTH AND THROAT WITH OR WITHOUT PRESENCE OF ULCERS  *URINARY PROBLEMS  *BOWEL PROBLEMS  UNUSUAL RASH Items with * indicate a potential emergency and should be followed up as soon as possible.  Feel free to call the clinic should you have any questions or concerns. The clinic phone number is [phone].  Please show the CHEMO ALERT CARD at check-in to the Emergency Department and triage nurse.  Paclitaxel injection (Taxol) What is this medicine? PACLITAXEL (PAK li TAX el) is a chemotherapy drug. It targets fast dividing cells, like cancer cells, and causes these cells to die. This medicine is used to treat ovarian cancer, breast cancer, and other cancers. This medicine may be used for other purposes; ask your health care provider or pharmacist if you have questions. COMMON BRAND NAME(S): Onxol, Taxol What should I tell my health care provider before I take this medicine? They need to know if you have any of these conditions: -blood disorders -irregular heartbeat -infection (especially a virus infection such as chickenpox, cold sores, or herpes) -liver disease -previous or ongoing radiation therapy -an unusual or allergic reaction to paclitaxel, alcohol, polyoxyethylated castor oil, other chemotherapy agents, other medicines, foods,  dyes, or preservatives -pregnant or trying to get pregnant -breast-feeding How should I use this medicine? This drug is given as an infusion into a vein. It is administered in a hospital or clinic by a specially trained health care professional. Talk to your pediatrician regarding the use of this medicine in children. Special care may be needed. Overdosage: If you think you have taken too much of this medicine contact a poison control center or emergency room at once. NOTE: This medicine is only for you. Do not share this medicine with others. What if I miss a dose? It is important not to miss your dose. Call your doctor or health care professional if you are unable to keep an appointment. What may interact with this medicine? Do not take this medicine with any of the following medications: -disulfiram -metronidazole This medicine may also interact with the following medications: -cyclosporine -diazepam -ketoconazole -medicines to increase blood counts like filgrastim, pegfilgrastim, sargramostim -other chemotherapy drugs like cisplatin, doxorubicin, epirubicin, etoposide, teniposide, vincristine -quinidine -testosterone -vaccines -verapamil Talk to your doctor or health care professional before taking any of these medicines: -acetaminophen -aspirin -ibuprofen -ketoprofen -naproxen This list may not describe all possible interactions. Give your health care provider a list of all the medicines, herbs, non-prescription drugs, or dietary supplements you use. Also tell them if you smoke, drink alcohol, or use illegal drugs. Some items may interact with your medicine. What should I watch for while using this medicine? Your condition will be monitored carefully while you are receiving this medicine. You will need important blood work done while you are taking this medicine. This medicine can cause serious allergic reactions. To reduce your risk you will need to   take other medicine(s)  before treatment with this medicine. If you experience allergic reactions like skin rash, itching or hives, swelling of the face, lips, or tongue, tell your doctor or health care professional right away. In some cases, you may be given additional medicines to help with side effects. Follow all directions for their use. This drug may make you feel generally unwell. This is not uncommon, as chemotherapy can affect healthy cells as well as cancer cells. Report any side effects. Continue your course of treatment even though you feel ill unless your doctor tells you to stop. Call your doctor or health care professional for advice if you get a fever, chills or sore throat, or other symptoms of a cold or flu. Do not treat yourself. This drug decreases your body's ability to fight infections. Try to avoid being around people who are sick. This medicine may increase your risk to bruise or bleed. Call your doctor or health care professional if you notice any unusual bleeding. Be careful brushing and flossing your teeth or using a toothpick because you may get an infection or bleed more easily. If you have any dental work done, tell your dentist you are receiving this medicine. Avoid taking products that contain aspirin, acetaminophen, ibuprofen, naproxen, or ketoprofen unless instructed by your doctor. These medicines may hide a fever. Do not become pregnant while taking this medicine. Women should inform their doctor if they wish to become pregnant or think they might be pregnant. There is a potential for serious side effects to an unborn child. Talk to your health care professional or pharmacist for more information. Do not breast-feed an infant while taking this medicine. Men are advised not to father a child while receiving this medicine. This product may contain alcohol. Ask your pharmacist or healthcare provider if this medicine contains alcohol. Be sure to tell all healthcare providers you are taking this  medicine. Certain medicines, like metronidazole and disulfiram, can cause an unpleasant reaction when taken with alcohol. The reaction includes flushing, headache, nausea, vomiting, sweating, and increased thirst. The reaction can last from 30 minutes to several hours. What side effects may I notice from receiving this medicine? Side effects that you should report to your doctor or health care professional as soon as possible: -allergic reactions like skin rash, itching or hives, swelling of the face, lips, or tongue -low blood counts - This drug may decrease the number of white blood cells, red blood cells and platelets. You may be at increased risk for infections and bleeding. -signs of infection - fever or chills, cough, sore throat, pain or difficulty passing urine -signs of decreased platelets or bleeding - bruising, pinpoint red spots on the skin, black, tarry stools, nosebleeds -signs of decreased red blood cells - unusually weak or tired, fainting spells, lightheadedness -breathing problems -chest pain -high or low blood pressure -mouth sores -nausea and vomiting -pain, swelling, redness or irritation at the injection site -pain, tingling, numbness in the hands or feet -slow or irregular heartbeat -swelling of the ankle, feet, hands Side effects that usually do not require medical attention (report to your doctor or health care professional if they continue or are bothersome): -bone pain -complete hair loss including hair on your head, underarms, pubic hair, eyebrows, and eyelashes -changes in the color of fingernails -diarrhea -loosening of the fingernails -loss of appetite -muscle or joint pain -red flush to skin -sweating This list may not describe all possible side effects. Call your doctor for medical advice about   side effects. You may report side effects to FDA at 1-800-FDA-1088. Where should I keep my medicine? This drug is given in a hospital or clinic and will not be  stored at home. NOTE: This sheet is a summary. It may not cover all possible information. If you have questions about this medicine, talk to your doctor, pharmacist, or health care provider.  2018 Elsevier/Gold Standard (2015-03-18 19:58:00)   Carboplatin injection What is this medicine? CARBOPLATIN (KAR boe pla tin) is a chemotherapy drug. It targets fast dividing cells, like cancer cells, and causes these cells to die. This medicine is used to treat ovarian cancer and many other cancers. This medicine may be used for other purposes; ask your health care provider or pharmacist if you have questions. COMMON BRAND NAME(S): Paraplatin What should I tell my health care provider before I take this medicine? They need to know if you have any of these conditions: -blood disorders -hearing problems -kidney disease -recent or ongoing radiation therapy -an unusual or allergic reaction to carboplatin, cisplatin, other chemotherapy, other medicines, foods, dyes, or preservatives -pregnant or trying to get pregnant -breast-feeding How should I use this medicine? This drug is usually given as an infusion into a vein. It is administered in a hospital or clinic by a specially trained health care professional. Talk to your pediatrician regarding the use of this medicine in children. Special care may be needed. Overdosage: If you think you have taken too much of this medicine contact a poison control center or emergency room at once. NOTE: This medicine is only for you. Do not share this medicine with others. What if I miss a dose? It is important not to miss a dose. Call your doctor or health care professional if you are unable to keep an appointment. What may interact with this medicine? -medicines for seizures -medicines to increase blood counts like filgrastim, pegfilgrastim, sargramostim -some antibiotics like amikacin, gentamicin, neomycin, streptomycin, tobramycin -vaccines Talk to your doctor  or health care professional before taking any of these medicines: -acetaminophen -aspirin -ibuprofen -ketoprofen -naproxen This list may not describe all possible interactions. Give your health care provider a list of all the medicines, herbs, non-prescription drugs, or dietary supplements you use. Also tell them if you smoke, drink alcohol, or use illegal drugs. Some items may interact with your medicine. What should I watch for while using this medicine? Your condition will be monitored carefully while you are receiving this medicine. You will need important blood work done while you are taking this medicine. This drug may make you feel generally unwell. This is not uncommon, as chemotherapy can affect healthy cells as well as cancer cells. Report any side effects. Continue your course of treatment even though you feel ill unless your doctor tells you to stop. In some cases, you may be given additional medicines to help with side effects. Follow all directions for their use. Call your doctor or health care professional for advice if you get a fever, chills or sore throat, or other symptoms of a cold or flu. Do not treat yourself. This drug decreases your body's ability to fight infections. Try to avoid being around people who are sick. This medicine may increase your risk to bruise or bleed. Call your doctor or health care professional if you notice any unusual bleeding. Be careful brushing and flossing your teeth or using a toothpick because you may get an infection or bleed more easily. If you have any dental work done, tell your dentist   you are receiving this medicine. Avoid taking products that contain aspirin, acetaminophen, ibuprofen, naproxen, or ketoprofen unless instructed by your doctor. These medicines may hide a fever. Do not become pregnant while taking this medicine. Women should inform their doctor if they wish to become pregnant or think they might be pregnant. There is a potential  for serious side effects to an unborn child. Talk to your health care professional or pharmacist for more information. Do not breast-feed an infant while taking this medicine. What side effects may I notice from receiving this medicine? Side effects that you should report to your doctor or health care professional as soon as possible: -allergic reactions like skin rash, itching or hives, swelling of the face, lips, or tongue -signs of infection - fever or chills, cough, sore throat, pain or difficulty passing urine -signs of decreased platelets or bleeding - bruising, pinpoint red spots on the skin, black, tarry stools, nosebleeds -signs of decreased red blood cells - unusually weak or tired, fainting spells, lightheadedness -breathing problems -changes in hearing -changes in vision -chest pain -high blood pressure -low blood counts - This drug may decrease the number of white blood cells, red blood cells and platelets. You may be at increased risk for infections and bleeding. -nausea and vomiting -pain, swelling, redness or irritation at the injection site -pain, tingling, numbness in the hands or feet -problems with balance, talking, walking -trouble passing urine or change in the amount of urine Side effects that usually do not require medical attention (report to your doctor or health care professional if they continue or are bothersome): -hair loss -loss of appetite -metallic taste in the mouth or changes in taste This list may not describe all possible side effects. Call your doctor for medical advice about side effects. You may report side effects to FDA at 1-800-FDA-1088. Where should I keep my medicine? This drug is given in a hospital or clinic and will not be stored at home. NOTE: This sheet is a summary. It may not cover all possible information. If you have questions about this medicine, talk to your doctor, pharmacist, or health care provider.  2018 Elsevier/Gold Standard  (2007-08-22 14:38:05)  

## 2017-10-20 NOTE — Progress Notes (Signed)
Hematology and Oncology Follow Up Visit  Shannon Scott [id number] [birth year] 77 y.o. 10/20/2017   Principle Diagnosis:  Locally advanced/recurrent endometrial carcinoma undifferentiated   Current Therapy:    Radiation therapy/weekly Taxol/carboplatinum      Interim History:  Shannon Scott is back for Shannon first office visit.  I had seen Shannon in the hospital at [facility].  She was there because of some GI bleeding.  She is hypercoagulable but cannot be on anticoagulation right now.  She had a locally recurrent endometrial cancer.  This was biopsied and found to be poorly differentiated.  She started radiation therapy with weekly low-dose carboplatinum/Taxol as radiosensitizers.  So far she is doing pretty well.  She is having some abdominal spasms.  I will try Shannon on some Donnatal to see if this can help.  She is still having some blood per rectum.  I am not sure how much this really is.  She is eating okay.  She is on some Marinol.  She has had no cough.  There is been no mouth sores.  She has had occasional leg swelling.  Thankfully, she is at home now.  She has a wonderful family that is doing Shannon best to try to help Shannon.  Shannon Scott comes in with Shannon today.  Shannon Scott I see works for the  [employer].  Unfortunate, Shannon husband is on dialysis so she has to help him out also.   Overall, Shannon performance status is ECOG 2.  Medications:  Current Outpatient Medications:  .  acetaminophen (TYLENOL) 500 MG tablet, Take 500 mg by mouth every 6 (six) hours as needed for mild pain, moderate pain, fever or headache., Disp: , Rfl:  .  chlorthalidone (HYGROTON) 25 MG tablet, Take 1 tablet (25 mg total) by mouth daily., Disp: 30 tablet, Rfl: 0 .  diclofenac sodium (VOLTAREN) 1 % GEL, Apply 2 g topically 4 (four) times daily., Disp: , Rfl:  .  diltiazem (TIAZAC) 300 MG 24 hr capsule, Take 300 mg by mouth daily., Disp: , Rfl:  .  dronabinol (MARINOL) 2.5 MG capsule, Take 1  capsule (2.5 mg total) by mouth 2 (two) times daily before lunch and supper., Disp: 30 capsule, Rfl: 0 .  lactose free nutrition (BOOST PLUS) LIQD, Take 237 mLs by mouth 3 (three) times daily with meals., Disp: , Rfl: 0 .  levothyroxine (SYNTHROID, LEVOTHROID) 50 MCG tablet, Take 50 mcg by mouth daily before breakfast. , Disp: , Rfl:  .  losartan (COZAAR) 100 MG tablet, Take 100 mg by mouth daily., Disp: , Rfl:  .  mirtazapine (REMERON) 15 MG tablet, Take 1 tablet (15 mg total) by mouth at bedtime., Disp: 30 tablet, Rfl: 0 .  pantoprazole (PROTONIX) 40 MG tablet, Take 1 tablet (40 mg total) by mouth daily., Disp: 30 tablet, Rfl: 0 .  polyethylene glycol (MIRALAX / GLYCOLAX) packet, Take 17 g by mouth daily., Disp: 30 each, Rfl: 0 .  senna-docusate (SENOKOT-S) 8.6-50 MG tablet, Take 1 tablet by mouth 2 (two) times daily., Disp: , Rfl:  .  traMADol (ULTRAM) 50 MG tablet, Take 1 tablet (50 mg total) by mouth every 6 (six) hours as needed for moderate pain., Disp: 15 tablet, Rfl: 0  Allergies:  Allergies  Allergen Reactions  . Statins Other (See Comments)    Myalgias and memory problems  . Ciprofloxacin Itching    Splotchy redness with itching during IV infusion localized to arm.    Past Medical History, Surgical  history, Social history, and Family History were reviewed and updated.  Review of Systems: Review of Systems  Constitutional: Positive for fatigue.  HENT:  Negative.   Eyes: Negative.   Respiratory: Negative.   Cardiovascular: Negative.   Gastrointestinal: Positive for abdominal pain, blood in stool and nausea.  Endocrine: Positive for hot flashes.  Genitourinary: Negative.    Musculoskeletal: Negative.   Skin: Negative.   Neurological: Negative.   Hematological: Negative.   Psychiatric/Behavioral: Negative.     Physical Exam:  weight is 139 lb (63 kg). Shannon oral temperature is 98.6 F (37 C). Shannon blood pressure is 129/45 (abnormal) and Shannon pulse is 83. Shannon respiration is  18 and oxygen saturation is 96%.   Wt Readings from Last 3 Encounters:  10/20/17 139 lb (63 kg)  10/16/17 148 lb 9.4 oz (67.4 kg)  08/29/17 153 lb (69.4 kg)    Physical Exam  Constitutional: She is oriented to person, place, and time.  HENT:  Head: Normocephalic and atraumatic.  Mouth/Throat: Oropharynx is clear and moist.  Eyes: Pupils are equal, round, and reactive to light. EOM are normal.  Neck: Normal range of motion.  Cardiovascular: Normal rate, regular rhythm and normal heart sounds.  Pulmonary/Chest: Effort normal and breath sounds normal.  Abdominal: Soft. Bowel sounds are normal.  She has a colostomy in the left lower quadrant of Shannon abdomen.  Bowel sounds are present.  She has some slight tenderness to palpation in the lower abdomen.  Musculoskeletal: Normal range of motion. She exhibits no edema, tenderness or deformity.  Lymphadenopathy:    She has no cervical adenopathy.  Neurological: She is alert and oriented to person, place, and time.  Skin: Skin is warm and dry. No rash noted. No erythema.  Psychiatric: She has a normal mood and affect. Shannon behavior is normal. Judgment and thought content normal.  Vitals reviewed.    Lab Results  Component Value Date   WBC 6.8 10/20/2017   HGB 10.8 (L) 10/20/2017   HCT 32.4 (L) 10/20/2017   MCV 86.6 10/20/2017   PLT 367 10/20/2017     Chemistry      Component Value Date/Time   NA 129 10/20/2017 0835   NA 136 (A) 09/08/2017   K 3.8 10/20/2017 0835   CL 96 (L) 10/20/2017 0835   CO2 28 10/20/2017 0835   BUN 24 (H) 10/20/2017 0835   BUN 8 09/08/2017   CREATININE 0.70 10/20/2017 0835   CREATININE 0.69 08/02/2016 1519   GLU 111 09/08/2017      Component Value Date/Time   CALCIUM 9.5 10/20/2017 0835   ALKPHOS 71 10/20/2017 0835   AST 21 10/20/2017 0835   ALT 16 10/20/2017 0835   BILITOT 0.6 10/20/2017 0835         Impression and Plan: Shannon Scott is a 77 year old white female.  She is actually a former  [employer].  As such, we need to make sure that she has the best care possible.  She, I think, is doing pretty well with the radiation and chemotherapy.  We will continue to treat Shannon.  I think the low-dose chemotherapy is at the right level.  Shannon blood counts look fine.  We will make sure that she has the Ultram.  I will see about putting Shannon on some Donnatal to help with the abdominal spasms.  She will have a refill on Shannon Marinol.  I spent about 30 minutes with she and Shannon Scott today.  All the time was spent face-to-face  with them.  We will have Shannon come back in 1 week.   Volanda Napoleon, MD 5/23/20199:27 AM

## 2017-10-21 ENCOUNTER — Other Ambulatory Visit: Payer: Self-pay | Admitting: Family

## 2017-10-21 ENCOUNTER — Ambulatory Visit: Payer: Medicare Other

## 2017-10-21 ENCOUNTER — Telehealth: Payer: Self-pay | Admitting: *Deleted

## 2017-10-21 ENCOUNTER — Ambulatory Visit
Admission: RE | Admit: 2017-10-21 | Discharge: 2017-10-21 | Disposition: A | Discharge status: Home / Self Care | Payer: Medicare Other | Source: Ambulatory Visit | Attending: Radiation Oncology | Admitting: Radiation Oncology

## 2017-10-21 DIAGNOSIS — D508 Other iron deficiency anemias: Secondary | ICD-10-CM

## 2017-10-21 DIAGNOSIS — D509 Iron deficiency anemia, unspecified: Secondary | ICD-10-CM | POA: Insufficient documentation

## 2017-10-21 DIAGNOSIS — C541 Malignant neoplasm of endometrium: Secondary | ICD-10-CM | POA: Diagnosis not present

## 2017-10-21 NOTE — Telephone Encounter (Signed)
Patient received her second dose of Carbo/Taxol yesterday in the office and she is calling with concern over several symptoms. Her and her daughter are not sure if these are to be expected or are of concern. They include nausea/vomitting, increased sweating through out the night, some transient disorientation when waking through out the night and generalized poor feeling. Patient is also receiving radiation therapy  Explained that it is normal for patients to have symptoms after treatment. Reeducated the patient and daughter about the use of PRN medications at home and to use them when symptoms present. Also explained that individuals response to treatment is not something that can be predicted and that every patient is different.   Reviewed symptoms that would be of concern, such as fever, vomiting not controlled with prn medications, any neurological symptoms that include confusion or extremity weakness, or any significant change from baseline. We will see her again on Thursday for her next chemo. She knows she can call the office or on-call service if she has any needed.

## 2017-10-25 ENCOUNTER — Ambulatory Visit: Payer: Medicare Other

## 2017-10-25 ENCOUNTER — Ambulatory Visit
Admission: RE | Admit: 2017-10-25 | Discharge: 2017-10-25 | Disposition: A | Discharge status: Home / Self Care | Payer: Medicare Other | Source: Ambulatory Visit | Attending: Radiation Oncology | Admitting: Radiation Oncology

## 2017-10-25 ENCOUNTER — Telehealth: Payer: Self-pay | Admitting: Emergency Medicine

## 2017-10-25 ENCOUNTER — Telehealth: Payer: Self-pay | Admitting: Medical

## 2017-10-25 ENCOUNTER — Inpatient Hospital Stay (HOSPITAL_BASED_OUTPATIENT_CLINIC_OR_DEPARTMENT_OTHER): Payer: Medicare Other | Admitting: Medical

## 2017-10-25 ENCOUNTER — Other Ambulatory Visit: Payer: Self-pay | Admitting: Medical

## 2017-10-25 ENCOUNTER — Encounter: Payer: Self-pay | Admitting: Hematology & Oncology

## 2017-10-25 ENCOUNTER — Telehealth: Payer: Self-pay | Admitting: *Deleted

## 2017-10-25 ENCOUNTER — Inpatient Hospital Stay: Payer: Medicare Other

## 2017-10-25 VITALS — BP 141/56 | HR 81 | Temp 98.2°F | Resp 18 | Ht 61.0 in | Wt 137.9 lb

## 2017-10-25 DIAGNOSIS — R197 Diarrhea, unspecified: Secondary | ICD-10-CM | POA: Diagnosis not present

## 2017-10-25 DIAGNOSIS — E039 Hypothyroidism, unspecified: Secondary | ICD-10-CM

## 2017-10-25 DIAGNOSIS — R110 Nausea: Secondary | ICD-10-CM

## 2017-10-25 DIAGNOSIS — C541 Malignant neoplasm of endometrium: Secondary | ICD-10-CM | POA: Diagnosis not present

## 2017-10-25 LAB — CBC WITH DIFFERENTIAL (CANCER CENTER ONLY)
Basophils Absolute: 0 10*3/uL (ref 0.0–0.1)
Basophils Relative: 1 %
Eosinophils Absolute: 0.1 10*3/uL (ref 0.0–0.5)
Eosinophils Relative: 2 %
HCT: 30.4 % — ABNORMAL LOW (ref 34.8–46.6)
Hemoglobin: 10.4 g/dL — ABNORMAL LOW (ref 11.6–15.9)
Lymphocytes Relative: 8 %
Lymphs Abs: 0.3 10*3/uL — ABNORMAL LOW (ref 0.9–3.3)
MCH: 28.9 pg (ref 25.1–34.0)
MCHC: 34.2 g/dL (ref 31.5–36.0)
MCV: 84.5 fL (ref 79.5–101.0)
Monocytes Absolute: 0.3 10*3/uL (ref 0.1–0.9)
Monocytes Relative: 6 %
Neutro Abs: 3.8 10*3/uL (ref 1.5–6.5)
Neutrophils Relative %: 83 %
Platelet Count: 308 10*3/uL (ref 145–400)
RBC: 3.6 MIL/uL — ABNORMAL LOW (ref 3.70–5.45)
RDW: 17.2 % — ABNORMAL HIGH (ref 11.2–14.5)
WBC Count: 4.5 10*3/uL (ref 3.9–10.3)

## 2017-10-25 LAB — CMP (CANCER CENTER ONLY)
ALT: 10 U/L (ref 0–55)
AST: 12 U/L (ref 5–34)
Albumin: 3.1 g/dL — ABNORMAL LOW (ref 3.5–5.0)
Alkaline Phosphatase: 67 U/L (ref 40–150)
Anion gap: 9 (ref 3–11)
BUN: 13 mg/dL (ref 7–26)
CO2: 22 mmol/L (ref 22–29)
Calcium: 9.3 mg/dL (ref 8.4–10.4)
Chloride: 98 mmol/L (ref 98–109)
Creatinine: 0.74 mg/dL (ref 0.60–1.10)
GFR, Est AFR Am: >60 mL/min (ref >60)
GFR, Estimated: >60 mL/min (ref >60)
Glucose, Bld: 131 mg/dL (ref 70–140)
Potassium: 4 mmol/L (ref 3.5–5.1)
Sodium: 129 mmol/L — ABNORMAL LOW (ref 136–145)
Total Bilirubin: 0.2 mg/dL (ref 0.2–1.2)
Total Protein: 7 g/dL (ref 6.4–8.3)

## 2017-10-25 LAB — MAGNESIUM: Magnesium: 1.5 mg/dL — ABNORMAL LOW (ref 1.7–2.4)

## 2017-10-25 MED ORDER — LEVOTHYROXINE SODIUM 50 MCG PO TABS: 50.0000 ug | ORAL_TABLET | Freq: Every day | ORAL | 1 refills | Status: DC

## 2017-10-25 MED ORDER — MAGNESIUM SULFATE 50 % IJ SOLN
4.0000 g | Freq: Once | INTRAMUSCULAR | Status: DC
  Filled 2017-10-25: qty 8

## 2017-10-25 NOTE — Telephone Encounter (Signed)
Shannon Scott from [facility] notifying the office that patient is having some symptoms. She is feeling very weak and fatigued. She is also having increased loose stool output from her colostomy. Patient would like to have her lab work checked to make sure her hemoglobin is okay and that she's not dehydrated. The has an appointment today at the [facility] for radiation.  Called symptom management clinic at [facility] to see if they could schedule patient for lab work today with [place] appointment. They will call and schedule patient for lab work today around her RXT and place orders.

## 2017-10-25 NOTE — Telephone Encounter (Signed)
Appointments scheduled AVS/Calendar printed per 5/28 los

## 2017-10-25 NOTE — Telephone Encounter (Signed)
Pt aware of infusion appt time of 1000 on 5/29 for magnesium.

## 2017-10-25 NOTE — Patient Instructions (Signed)
Hypomagnesemia  Hypomagnesemia is a condition in which the level of magnesium in the blood is low. Magnesium is a mineral that is found in many foods. It is used in many different processes in the body. Hypomagnesemia can affect every organ in the body. It can cause life-threatening problems.  What are the causes?  Causes of hypomagnesemia include:   Not getting enough magnesium in your diet.   Malnutrition.   Problems with absorbing magnesium from the intestines.   Dehydration.   Alcohol abuse.   Vomiting.   Severe diarrhea.   Some medicines, including medicines that make you urinate more.   Certain diseases, such as kidney disease, diabetes, and overactive thyroid.    What are the signs or symptoms?   Involuntary shaking or trembling of a body part (tremor).   Confusion.   Muscle weakness.   Sensitivity to light, sound, and touch.   Psychiatric issues, such as depression, irritability, or psychosis.   Sudden tightening of muscles (muscle spasms).   Tingling in the arms and legs.   A feeling of fluttering of the heart.  These symptoms are more severe if magnesium levels drop suddenly.  How is this diagnosed?  To make a diagnosis, your health care provider will do a physical exam and order blood and urine tests.  How is this treated?  Treatment will depend on the cause and the severity of your condition. It may involve:   A magnesium supplement. This can be taken in pill form. It can also be given through an IV tube. This is usually done if the condition is severe.   Changes to your diet. You may be directed to eat foods that have a lot of magnesium, such as green leafy vegetables, peas, beans, and nuts.   Eliminating alcohol from your diet.    Follow these instructions at home:   Include foods with magnesium in your diet. Foods that are rich in magnesium include green vegetables, beans, nuts and seeds, and whole grains.   Take medicines only as directed by your health care provider.   Take  magnesium supplements if your health care provider instructs you to do that. Take them as directed.   Have your magnesium levels monitored as directed by your health care provider.   When you are active, drink fluids that contain electrolytes.   Keep all follow-up visits as directed by your health care provider. This is important.  Contact a health care provider if:   You get worse instead of better.   Your symptoms return.  Get help right away if:   Your symptoms are severe.  This information is not intended to replace advice given to you by your health care provider. Make sure you discuss any questions you have with your health care provider.  Document Released: 02/10/2005 Document Revised: 10/23/2015 Document Reviewed: 12/31/2013  Elsevier Interactive Patient Education  2018 [employer]

## 2017-10-25 NOTE — Telephone Encounter (Signed)
Received voicemail from [employer] at [facility] about pt who called earlier reporting diarrhea and fatigue.  PA Lucianne Lei agreed to see pt of [facility] for [facility] appt and labs.  Scheduling message sent, pt expected to either come before or after radiation appt today at [facility].

## 2017-10-25 NOTE — Telephone Encounter (Signed)
Left message for patient regarding appointment for infusion add on - ok'd be Renee Pain to be added onto Symptom Mgmt schedule per 5/28 los

## 2017-10-26 ENCOUNTER — Inpatient Hospital Stay: Payer: Medicare Other

## 2017-10-26 ENCOUNTER — Ambulatory Visit: Payer: Medicare Other

## 2017-10-26 ENCOUNTER — Inpatient Hospital Stay: Payer: Medicare Other | Admitting: Nutrition

## 2017-10-26 ENCOUNTER — Ambulatory Visit
Admission: RE | Admit: 2017-10-26 | Discharge: 2017-10-26 | Disposition: A | Discharge status: Home / Self Care | Payer: Medicare Other | Source: Ambulatory Visit | Attending: Radiation Oncology | Admitting: Radiation Oncology

## 2017-10-26 DIAGNOSIS — C541 Malignant neoplasm of endometrium: Secondary | ICD-10-CM | POA: Diagnosis not present

## 2017-10-26 DIAGNOSIS — D508 Other iron deficiency anemias: Secondary | ICD-10-CM

## 2017-10-26 MED ORDER — HEPARIN SOD (PORK) LOCK FLUSH 100 UNIT/ML IV SOLN
500.0000 [IU] | Freq: Once | INTRAVENOUS | Status: AC | PRN
  Administered 2017-10-26: 500 [IU]
  Filled 2017-10-26: qty 5

## 2017-10-26 MED ORDER — SODIUM CHLORIDE 0.9% FLUSH
10.0000 mL | INTRAVENOUS | Status: DC | PRN
  Administered 2017-10-26: 10 mL
  Filled 2017-10-26: qty 10

## 2017-10-26 MED ORDER — SODIUM CHLORIDE 0.9 % IV SOLN
4.0000 g | Freq: Once | INTRAVENOUS | Status: AC
  Administered 2017-10-26: 4 g via INTRAVENOUS
  Filled 2017-10-26: qty 8

## 2017-10-26 NOTE — Patient Instructions (Signed)
Hypomagnesemia  Hypomagnesemia is a condition in which the level of magnesium in the blood is low. Magnesium is a mineral that is found in many foods. It is used in many different processes in the body. Hypomagnesemia can affect every organ in the body. It can cause life-threatening problems.  What are the causes?  Causes of hypomagnesemia include:   Not getting enough magnesium in your diet.   Malnutrition.   Problems with absorbing magnesium from the intestines.   Dehydration.   Alcohol abuse.   Vomiting.   Severe diarrhea.   Some medicines, including medicines that make you urinate more.   Certain diseases, such as kidney disease, diabetes, and overactive thyroid.    What are the signs or symptoms?   Involuntary shaking or trembling of a body part (tremor).   Confusion.   Muscle weakness.   Sensitivity to light, sound, and touch.   Psychiatric issues, such as depression, irritability, or psychosis.   Sudden tightening of muscles (muscle spasms).   Tingling in the arms and legs.   A feeling of fluttering of the heart.  These symptoms are more severe if magnesium levels drop suddenly.  How is this diagnosed?  To make a diagnosis, your health care provider will do a physical exam and order blood and urine tests.  How is this treated?  Treatment will depend on the cause and the severity of your condition. It may involve:   A magnesium supplement. This can be taken in pill form. It can also be given through an IV tube. This is usually done if the condition is severe.   Changes to your diet. You may be directed to eat foods that have a lot of magnesium, such as green leafy vegetables, peas, beans, and nuts.   Eliminating alcohol from your diet.    Follow these instructions at home:   Include foods with magnesium in your diet. Foods that are rich in magnesium include green vegetables, beans, nuts and seeds, and whole grains.   Take medicines only as directed by your health care provider.   Take  magnesium supplements if your health care provider instructs you to do that. Take them as directed.   Have your magnesium levels monitored as directed by your health care provider.   When you are active, drink fluids that contain electrolytes.   Keep all follow-up visits as directed by your health care provider. This is important.  Contact a health care provider if:   You get worse instead of better.   Your symptoms return.  Get help right away if:   Your symptoms are severe.  This information is not intended to replace advice given to you by your health care provider. Make sure you discuss any questions you have with your health care provider.  Document Released: 02/10/2005 Document Revised: 10/23/2015 Document Reviewed: 12/31/2013  Elsevier Interactive Patient Education  2018 [employer]

## 2017-10-26 NOTE — Progress Notes (Signed)
[facility] Progress Note   DONICE ALPERIN [id number] 1940/07/22 77 y.o.  KATELEEN ENCARNACION is managed by Dr. Burney Gauze  Actively treated with chemotherapy: yes  Current Therapy: Carboplatin and paclitaxel  Last Treated: 10/20/2017 (cycle 2, day 1)  Assessment: Plan:    Hypothyroidism, unspecified type - Plan: levothyroxine (SYNTHROID, LEVOTHROID) 50 MCG tablet  Hypomagnesemia - Plan: magnesium sulfate 4 g in sodium chloride 0.9 % 500 mL  Nausea without vomiting  Diarrhea, unspecified type   Hypothyroidism: Mrs. Olenick has recently had a change in her primary care provider.  She is previously been treated with levothyroxine 50 mcg once daily but has not been on this medication for several weeks as there was no refill at the pharmacy. A prescription for levothyroxine 50 mcg tablets once daily was sent to the patient's pharmacy.  She was instructed to contact her primary care provider for follow-up.  Hypomagnesemia.  The patient's labs returned today with a magnesium of 1.5.  She will return tomorrow for 4 g of magnesium sulfate IV in 500 mL's of fluid.  Nausea without vomiting: The patient has been instructed to continue using Zofran as needed and to pick up her prescription for Compazine but is at her pharmacy.  Diarrhea: The patient is instructed to continue using Imodium as needed.  Greater than one half of this 25-minute visit was spent in counseling.  Please see After Visit Summary for patient specific instructions.  Future Appointments  Date Time Provider [place]  10/26/2017  2:00 PM [facility] LINAC 3 [facility] None  10/27/2017 10:45 AM [facility] LINAC 3 [facility] None  10/27/2017 11:45 AM [facility] LAB [facility] None  10/27/2017 12:00 PM [place], Sarah M, NP [facility] None  10/27/2017 12:30 PM [facility] B1 [facility] None  10/28/2017  2:15 PM [facility] LINAC 3 [facility] None  10/31/2017  2:25 PM [facility] LINAC 3 [facility] None    11/01/2017  1:00 PM [facility] LINAC 3 [facility] None  11/02/2017  2:00 PM [facility] LINAC 3 [facility] None  11/03/2017 11:30 AM [facility] LINAC 3 [facility] None  11/03/2017  1:00 PM [facility] LAB [facility] None  11/03/2017  1:30 PM [facility] A4 [facility] None  11/04/2017  2:00 PM [facility] LINAC 3 [facility] None  11/07/2017  2:15 PM [facility] LINAC 3 [facility] None  11/08/2017  2:15 PM [facility] LINAC 3 [facility] None  11/09/2017  2:00 PM [facility] LINAC 3 [facility] None  11/10/2017 10:45 AM [facility] LINAC 3 [facility] None  11/10/2017 12:00 PM [facility] LAB [facility] None  11/10/2017 12:30 PM [facility] A1 [facility] None  11/11/2017  2:00 PM [facility] LINAC 3 [facility] None  11/14/2017  2:15 PM [facility] LINAC 3 [facility] None  11/15/2017  2:15 PM [facility] LINAC 3 [facility] None  11/16/2017  2:15 PM [facility] LINAC 3 [facility] None  11/17/2017 10:45 AM [facility] LINAC 3 [facility] None  11/17/2017 12:00 PM [facility] None  11/17/2017 12:30 PM [facility] A1 [facility] None  11/18/2017  2:15 PM [facility] LINAC 3 [facility] None  11/21/2017  2:15 PM [facility] LINAC 3 [facility] None    No orders of the defined types were placed in this encounter.      Subjective:   Patient ID:  Shannon Scott is a 77 y.o. (DOB 01-30-1941) female.  Chief Complaint:  Chief Complaint  Patient presents with  . Fatigue    HPI ROSILYN COACHMAN is a 77 year old female with a history of a locally advanced/recurrent undifferentiated endometrial carcinoma who is managed by Dr. Burney Gauze.  She is currently treated with concurrent chemoradiation.  She is most recently treated with cycle 2, day 1 of carboplatin and paclitaxel on 10/20/2017.  She requested to be seen today after her radiation therapy appointment.  She was recently hospitalized from 10/03/2017 through 10/16/2017.  She reports diarrhea with watery stools into her colostomy and from her rectum, fatigue, nausea with no  vomiting, night sweats, and generalized new weakness.  She has been taking Zofran for her nausea without complete resolution of her symptoms.  She has not picked up her prescription for Compazine at the pharmacy.  She has been taking Imodium intermittently but is concerned that it could cause constipation.  She did not have a prescription for Synthroid at her pharmacy and reports that she has been out of it for 2 to 3 weeks.  She has transition to a new primary care provider and is unsure if this was sent to her pharmacy.  She has not contacted her new primary care provider to ask for the refill.  She presents to the office today with her son.  Medications: I have reviewed the patient's current medications.  Allergies:  Allergies  Allergen Reactions  . Statins Other (See Comments)    Myalgias and memory problems  . Ciprofloxacin Itching    Splotchy redness with itching during IV infusion localized to arm.    Past Medical History:  Diagnosis Date  . Arthritis   . Asthma    allergy induced  . Bilateral carotid artery disease (HCC)    L carotid bruit  . Bursitis    left hip  . CAD (coronary artery disease)   . Cancer ([place])   . Dyslipidemia    intolerant to statins, welchol, niacin, zetia  . Goals of care, counseling/discussion 10/11/2017  . History of blood transfusion   . History of nuclear stress test 04/24/2012   lexiscan; normal study  . Hypertension   . Hypothyroidism   . 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 ([place]) 10/11/2017  . Postoperative nausea and vomiting 01/02/2016    Past Surgical History:  Procedure Laterality Date  . ABDOMINAL HYSTERECTOMY    . Carotid Doppler  02/2012   40-59% right int carotid artery stenosis; 60-79% L int carotid stenosis; L carotid bruit  . CORONARY ARTERY BYPASS GRAFT  03/12/2004   LIMA to LAD, SVG to circumflex, SVG to PDA  . CYSTOSCOPY WITH STENT PLACEMENT Bilateral 08/21/2017   Procedure: CYSTOSCOPY  WITH STENT PLACEMENT;  Surgeon: Ceasar Mons, MD;  Location: [facility];  Service: Urology;  Laterality: Bilateral;  . IR FLUORO GUIDE PORT INSERTION RIGHT  10/11/2017  . IR GENERIC HISTORICAL  04/29/2016   IR RADIOLOGIST EVAL & MGMT 04/29/2016 Sandi Mariscal, MD [facility] INTERV RAD  . IR GENERIC HISTORICAL  05/12/2016   IR RADIOLOGIST EVAL & MGMT 05/12/2016 Sandi Mariscal, MD [facility] INTER[facility] RAD  . IR IVC FILTER PLMT / S&I /IMG GUID/MOD SED  10/11/2017  . IR US GUIDE VASC ACCESS RIGHT  10/11/2017  . ROBOTIC ASSISTED TOTAL HYSTERECTOMY WITH BILATERAL SALPINGO OOPHERECTOMY Bilateral 12/30/2015   Procedure: XI ROBOTIC ASSISTED TOTAL HYSTERECTOMY WITH BILATERAL SALPINGO OOPHORECTOMY WITH SENTAL LYMPH NODE BIOPSY;  Surgeon: Nancy Marus, MD;  Location: [facility];  Service: Gynecology;  Laterality: Bilateral;  . TONSILLECTOMY    . TRANSTHORACIC ECHOCARDIOGRAM  04/2007   EF>55%; mild MR; mild-mod TR; mild pulm HTN; mild calcification of aortiv valve leaflets with mild valvular aortic stenosis  . TUBAL LIGATION  Family History  Problem Relation Age of Onset  . Hypertension Mother   . Heart disease Mother        Died in her 34s  . Stroke Father   . Kidney disease Brother   . Heart disease Brother        also HTN, hyperlipidemia  . Heart attack Brother   . Stroke Sister        x2  . Hypertension Sister     Social History   Socioeconomic History  . Marital status: Widowed    Spouse name: Not on file  . Number of children: 2  . Years of education: Not on file  . Highest education level: Not on file  Occupational History    Employer: RETIRED  Social Needs  . Financial resource strain: Not on file  . Food insecurity:    Worry: Not on file    Inability: Not on file  . Transportation needs:    Medical: Not on file    Non-medical: Not on file  Tobacco Use  . Smoking status: Never Smoker  . Smokeless tobacco: Never Used  Substance and Sexual Activity  . Alcohol use: No  . Drug use: No    . Sexual activity: Not on file  Lifestyle  . Physical activity:    Days per week: Not on file    Minutes per session: Not on file  . Stress: Not on file  Relationships  . Social connections:    Talks on phone: Not on file    Gets together: Not on file    Attends religious service: Not on file    Active member of club or organization: Not on file    Attends meetings of clubs or organizations: Not on file    Relationship status: Not on file  . Intimate partner violence:    Fear of current or ex partner: Not on file    Emotionally abused: Not on file    Physically abused: Not on file    Forced sexual activity: Not on file  Other Topics Concern  . Not on file  Social History Narrative  . Not on file    Past Medical History, Surgical history, Social history, and Family history were reviewed and updated as appropriate.   Please see review of systems for further details on the patient's review from today.   Review of Systems:  Review of Systems  Constitutional: Positive for diaphoresis and fatigue. Negative for appetite change, chills and fever.  Respiratory: Negative for cough, chest tightness and shortness of breath.   Cardiovascular: Negative for chest pain, palpitations and leg swelling.  Gastrointestinal: Positive for diarrhea and nausea. Negative for abdominal distention, abdominal pain, blood in stool, constipation and vomiting.  Genitourinary: Negative for decreased urine volume and difficulty urinating.  Neurological: Positive for weakness.    Objective:   Physical Exam:  BP (!) 141/56 (BP Location: Right Arm, Patient Position: Sitting) Comment: nurse aware of bp  Pulse 81   Temp 98.2 F (36.8 C) (Oral)   Resp 18   Ht 5' 1"  (1.549 m)   Wt 137 lb 14.4 oz (62.6 kg)   SpO2 97%   BMI 26.06 kg/m   ECOG: 1  Physical Exam  Constitutional: No distress.  HENT:  Head: Normocephalic and atraumatic.  Neurological: She is alert. Coordination (The patient is ambulating  with the use of a wheelchair.) abnormal.  Skin: Skin is warm and dry. She is not diaphoretic.    Lab Review:  Component Value Date/Time   NA 129 (L) 10/25/2017 1356   NA 136 (A) 09/08/2017   K 4.0 10/25/2017 1356   CL 98 10/25/2017 1356   CO2 22 10/25/2017 1356   GLUCOSE 131 10/25/2017 1356   BUN 13 10/25/2017 1356   BUN 8 09/08/2017   CREATININE 0.74 10/25/2017 1356   CREATININE 0.69 08/02/2016 1519   CALCIUM 9.3 10/25/2017 1356   PROT 7.0 10/25/2017 1356   ALBUMIN 3.1 (L) 10/25/2017 1356   AST 12 10/25/2017 1356   ALT 10 10/25/2017 1356   ALKPHOS 67 10/25/2017 1356   BILITOT 0.2 10/25/2017 1356   GFRNONAA >60 10/25/2017 1356   GFRAA >60 10/25/2017 1356       Component Value Date/Time   WBC 4.5 10/25/2017 1356   WBC 9.7 10/16/2017 0514   RBC 3.60 (L) 10/25/2017 1356   HGB 10.4 (L) 10/25/2017 1356   HCT 30.4 (L) 10/25/2017 1356   PLT 308 10/25/2017 1356   MCV 84.5 10/25/2017 1356   MCH 28.9 10/25/2017 1356   MCHC 34.2 10/25/2017 1356   RDW 17.2 (H) 10/25/2017 1356   LYMPHSABS 0.3 (L) 10/25/2017 1356   MONOABS 0.3 10/25/2017 1356   EOSABS 0.1 10/25/2017 1356   BASOSABS 0.0 10/25/2017 1356   -------------------------------  Imaging from last 24 hours (if applicable):  Radiology interpretation: Ct Abdomen Pelvis W Contrast  Result Date: 10/03/2017 CLINICAL DATA:  Rectal bleeding.  Malaise. HPIPt had surgery 1 month ago associated with a pelvic /colonic mass. Pt had a diverting colostomy. She did not have the mass removed. Pt states since then she continues to have pain in the pelvic region . She also is having blood from her rectum ever since the surgery but it seems to be getting worse and the bleeding is more severe. She has been seeing her surgeons and She had a flexible sigmoidoscopy on Friday but they did not determine the cause. Pt is concerned because it is taking long to figure out what is going on. They still dont have a tissue diagnosis. Pt called the  surgeon today and was told to come to the ED. Pt feels like she is getting worse. PT did not take any of her medications today. EXAM: CT ABDOMEN AND PELVIS WITH CONTRAST TECHNIQUE: Multidetector CT imaging of the abdomen and pelvis was performed using the standard protocol following bolus administration of intravenous contrast. CONTRAST:  129m ISOVUE-300 IOPAMIDOL (ISOVUE-300) INJECTION 61% COMPARISON:  08/18/2017 FINDINGS: Lower chest: 4 mm pleural-based nodular opacity in the lateral left lower lobe, image 3, series 4, above the field of view from the most recent prior study. Present and similar in size on a CT dated 01/14/2016 consistent with a benign nodule. No acute findings. Heart normal in size. Hepatobiliary: No liver masses. Focal fat noted adjacent to the falciform ligament. No other lesions. Liver normal in size. A few small dependent densities are noted in the gallbladder consistent with stones. No wall thickening or inflammation. No bile duct dilation. Pancreas: Unremarkable. No pancreatic ductal dilatation or surrounding inflammatory changes. Spleen: Normal in size without focal abnormality. Adrenals/Urinary Tract: No adrenal masses. There is mild-to-moderate left renal collecting system dilation. A stent extends from the left renal pelvis through the left ureter to the bladder, well positioned. No right hydronephrosis. Small, 11 mm, lower pole renal cyst. No other renal masses. No intrarenal stones. Right ureters normal in course and in caliber. Bladder is deviated by the large pelvic mass, but otherwise unremarkable. Stomach/Bowel: A large heterogeneous  mass arises from the sigmoid colon and extends to the pelvic sidewall involving the ileo psoas muscle. Mass measures approximately 6.9 cm from superior to inferior by 7 cm x 5.5 cm transversely. This has increased in size from the prior exam. No other colonic masses. There are adjacent inflammatory changes and several prominent, subcentimeter lymph  nodes. Multiple diverticula are noted along the colon, stable. A diverting loop colostomy has been performed in utilizing the mid transverse colon. Stomach shows a masslike area arising from the cardia extending to the gastroesophageal junction similar to the prior exam. This may be from previous hiatal hernia surgery or reflect a prominent fold. It is felt unlikely reflect neoplastic disease since it is stable when compared to 01/14/2016 CT. Small bowel is unremarkable. Vascular/Lymphatic: There are few prominent to mildly enlarged pelvic and retroperitoneal lymph nodes. Largest measuring 1 cm lying to the left of the aortic bifurcation. This node is increased in size from the prior CT where it measured 6 mm. Reproductive: Uterus is surgically absent. Other: Hazy inflammatory change is noted in the pelvis that has increased from the prior exam. No ascites. Musculoskeletal: No fracture or acute finding. No osteoblastic or osteolytic lesions. IMPRESSION: 1. No acute findings. 2. 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 It extends to involve the left pelvic sidewall, specifically the ileo psoas muscle. There are now inflammatory type changes adjacent to the mass that have increased from prior exam. There are multiple prominent to borderline enlarged lymph nodes several increased in size from the prior exam. No other evidence of metastatic disease. 3. Loop colostomy lies in transverse colon has been performed since the prior study. No evidence of bowel obstruction. 4. There is left hydronephrosis despite a well-positioned left ureteral stent. No right hydronephrosis. 5. Few small gallstones.  Aortic atherosclerosis. 6. Masslike prominence in the gastric cardia is most likely a prominent fold. This could be from previous hiatal hernia surgery if this has been performed in the past. The appearance of this portion of the stomach is stable from prior CTs dating back to August 2017. Electronically Signed    By: Lajean Manes M.D.   On: 10/03/2017 21:33   Ir Ivc Filter Plmt / S&i /img Guid/mod Sed  Result Date: 10/11/2017 INDICATION: 77 year old with 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 Patient has a DVT in the left lower extremity. Patient has recent GI bleeding and she is not a candidate for anticoagulation at this time. Plan for placement of an IVC filter. EXAM: IVC FILTER PLACEMENT; IVC VENOGRAM; ULTRASOUND FOR VASCULAR ACCESS Physician: Stephan Minister. Anselm Pancoast, MD MEDICATIONS: None. ANESTHESIA/SEDATION: 1 mg Versed. This procedure was performed immediately following the Port-A-Cath placement. The patient was continuously monitored during the procedure by the interventional radiology nurse under my direct supervision. CONTRAST:  80 mL Isovue-300 FLUOROSCOPY TIME:  Fluoroscopy Time: 1 minutes 30 seconds (28 mGy). COMPLICATIONS: None immediate. PROCEDURE: The procedure was explained to the patient. The risks and benefits of the procedure were discussed and the patient's questions were addressed. Informed consent was obtained from the patient. Ultrasound demonstrated a patent right common femoral vein. Ultrasound images were obtained for documentation. The right groin was prepped and draped in a sterile fashion. Maximal barrier sterile technique was utilized including caps, mask, sterile gowns, sterile gloves, sterile drape, hand hygiene and skin antiseptic. The skin was anesthetized with 1% lidocaine. A 21 gauge needle was directed into the vein with ultrasound guidance and a micropuncture dilator set was placed. A wire was  advanced into the IVC. The filter sheath was advanced over the wire into the IVC. An IVC venogram was performed. Fluoroscopic images were obtained for documentation. A Bard Denali filter was deployed below the lowest renal vein. A follow-up venogram was performed and the vascular sheath was removed with manual compression. FINDINGS: IVC was patent. Bilateral renal veins were identified. The  filter was deployed below the lowest renal vein. Follow-up venogram confirmed placement within the IVC and below the renal veins. IMPRESSION: Successful placement of a retrievable IVC filter. PLAN: Due to patient related comorbidities and/or clinical necessity, this IVC filter should be considered a permanent device. This patient will not be actively followed for future filter retrieval. Electronically Signed   By: Markus Daft M.D.   On: 10/11/2017 14:55   Ir US Guide Vasc Access Right  Addendum Date: 10/11/2017   ADDENDUM REPORT: 10/11/2017 17:32 ADDENDUM: The port was secured to the chest wall with suture. Electronically Signed   By: Markus Daft M.D.   On: 10/11/2017 17:32   Result Date: 10/11/2017 INDICATION: 77 year old with history of uterine cancer. Recently biopsied pelvic mass demonstrates a poorly differentiated carcinoma. Patient needs Port-A-Cath for chemotherapy. EXAM: FLUOROSCOPIC AND ULTRASOUND GUIDED PLACEMENT OF A SUBCUTANEOUS PORT COMPARISON:  None. MEDICATIONS: Ancef 2 g; The antibiotic was administered within an appropriate time interval prior to skin puncture. ANESTHESIA/SEDATION: Versed 2.0 mg IV; Fentanyl 100 mcg IV; Moderate Sedation Time:  35 minutes The patient was continuously monitored during the procedure by the interventional radiology nurse under my direct supervision. FLUOROSCOPY TIME:  54 seconds, 3 mGy COMPLICATIONS: None immediate. PROCEDURE: The procedure, risks, benefits, and alternatives were explained to the patient. Questions regarding the procedure were encouraged and answered. The patient understands and consents to the procedure. Patient was placed supine on the interventional table. Ultrasound confirmed a patent right internal jugular vein. The right chest and neck were cleaned with a skin antiseptic and a sterile drape was placed. Maximal barrier sterile technique was utilized including caps, mask, sterile gowns, sterile gloves, sterile drape, hand hygiene and skin  antiseptic. The right neck was anesthetized with 1% lidocaine. Small incision was made in the right neck with a blade. Micropuncture set was placed in the right internal jugular vein with ultrasound guidance. The micropuncture wire was used for measurement purposes. The right chest was anesthetized with 1% lidocaine with epinephrine. #15 blade was used to make an incision and a subcutaneous port pocket was formed. [place] was assembled. Subcutaneous tunnel was formed with a stiff tunneling device. The port catheter was brought through the subcutaneous tunnel. The port was placed in the subcutaneous pocket. The micropuncture set was exchanged for a peel-away sheath. The catheter was placed through the peel-away sheath and the tip was positioned near the superior cavoatrial junction. Catheter placement was confirmed with fluoroscopy. The port was accessed and flushed with saline. The port pocket was closed using two layers of absorbable sutures and Dermabond. The vein skin site was closed using a single layer of absorbable suture and Dermabond. Sterile dressings were applied. Patient tolerated the procedure well without an immediate complication. Ultrasound confirmed a patent right internal jugular vein. Ultrasound was used for vascular guidance. Ultrasound image was saved for documentation. Fluoroscopic images were taken and saved for this procedure. IMPRESSION: Placement of a subcutaneous port device. This is a CT injectable Port-A-Cath. Electronically Signed: By: Markus Daft M.D. On: 10/11/2017 14:49   Ct Biopsy  Result Date: 10/05/2017 INDICATION: 77 year old female with a  history of locally invasive exophytic sigmoid colonic mass highly concerning for adenocarcinoma. She has not undergone prior biopsy and presents for CT-guided biopsy to obtain tissue diagnosis. EXAM: CT guided tissue biopsy of left pelvic mass MEDICATIONS: None. ANESTHESIA/SEDATION: Moderate (conscious) sedation was employed during  this procedure. A total of Versed 2 mg and Fentanyl 100 mcg was administered intravenously. Moderate Sedation Time: 12 minutes. The patient's level of consciousness and vital signs were monitored continuously by radiology nursing throughout the procedure under my direct supervision. FLUOROSCOPY TIME:  Fluoroscopy Time: 0 minutes 0 seconds (0 mGy). COMPLICATIONS: None immediate. PROCEDURE: Informed written consent was obtained from the patient after a thorough discussion of the procedural risks, benefits and alternatives. All questions were addressed. A timeout was performed prior to the initiation of the procedure. A planning axial CT scan was performed. The exophytic mass was identified in the left lower quadrant. A suitable skin entry site was selected medial to the inferior epigastric vessels. The overlying skin was prepped and marked. Under intermittent CT guidance, a 17 gauge introducer needle was advanced into the margin of the mass. Multiple 18 gauge core biopsies were then coaxially obtained and placed in formalin before being delivered to pathology for further analysis. The introducer needle was removed. Post biopsy axial CT imaging demonstrates no evidence of immediate complication. Patient tolerated the procedure well. IMPRESSION: Technically successful CT-guided biopsy of left pelvic mass. Electronically Signed   By: Jacqulynn Cadet M.D.   On: 10/05/2017 09:21   Ir Fluoro Guide Port Insertion Right  Addendum Date: 10/11/2017   ADDENDUM REPORT: 10/11/2017 17:32 ADDENDUM: The port was secured to the chest wall with suture. Electronically Signed   By: Markus Daft M.D.   On: 10/11/2017 17:32   Result Date: 10/11/2017 INDICATION: 77 year old with history of uterine cancer. Recently biopsied pelvic mass demonstrates a poorly differentiated carcinoma. Patient needs Port-A-Cath for chemotherapy. EXAM: FLUOROSCOPIC AND ULTRASOUND GUIDED PLACEMENT OF A SUBCUTANEOUS PORT COMPARISON:  None. MEDICATIONS: Ancef  2 g; The antibiotic was administered within an appropriate time interval prior to skin puncture. ANESTHESIA/SEDATION: Versed 2.0 mg IV; Fentanyl 100 mcg IV; Moderate Sedation Time:  35 minutes The patient was continuously monitored during the procedure by the interventional radiology nurse under my direct supervision. FLUOROSCOPY TIME:  54 seconds, 3 mGy COMPLICATIONS: None immediate. PROCEDURE: The procedure, risks, benefits, and alternatives were explained to the patient. Questions regarding the procedure were encouraged and answered. The patient understands and consents to the procedure. Patient was placed supine on the interventional table. Ultrasound confirmed a patent right internal jugular vein. The right chest and neck were cleaned with a skin antiseptic and a sterile drape was placed. Maximal barrier sterile technique was utilized including caps, mask, sterile gowns, sterile gloves, sterile drape, hand hygiene and skin antiseptic. The right neck was anesthetized with 1% lidocaine. Small incision was made in the right neck with a blade. Micropuncture set was placed in the right internal jugular vein with ultrasound guidance. The micropuncture wire was used for measurement purposes. The right chest was anesthetized with 1% lidocaine with epinephrine. #15 blade was used to make an incision and a subcutaneous port pocket was formed. [place] was assembled. Subcutaneous tunnel was formed with a stiff tunneling device. The port catheter was brought through the subcutaneous tunnel. The port was placed in the subcutaneous pocket. The micropuncture set was exchanged for a peel-away sheath. The catheter was placed through the peel-away sheath and the tip was positioned near the superior  cavoatrial junction. Catheter placement was confirmed with fluoroscopy. The port was accessed and flushed with saline. The port pocket was closed using two layers of absorbable sutures and Dermabond. The vein skin site was  closed using a single layer of absorbable suture and Dermabond. Sterile dressings were applied. Patient tolerated the procedure well without an immediate complication. Ultrasound confirmed a patent right internal jugular vein. Ultrasound was used for vascular guidance. Ultrasound image was saved for documentation. Fluoroscopic images were taken and saved for this procedure. IMPRESSION: Placement of a subcutaneous port device. This is a CT injectable Port-A-Cath. Electronically Signed: By: Markus Daft M.D. On: 10/11/2017 14:49

## 2017-10-26 NOTE — Progress Notes (Signed)
Consultation with dietitian requested.   ?

## 2017-10-26 NOTE — Progress Notes (Signed)
Nutrition Assessment   Reason for Assessment:   Verbal referral from symptom management regarding poor appetite and weight loss  ASSESSMENT:  77 year old female with advanced endometrial cancer.  Patient receiving chemotherapy at [facility] and radiation therapy at [facility].  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    Met with patient and son in symptom management clinic this am.  Patient reports appetite has been poor for awhile.  Reports that when she eats she feels like food gets backed up in her throat. Feels nauseated.  Reports taking zofran but not compazine.  Also has not been taking synthroid since being out of the hospital.  Provided addressed this today.  Also reports issues with increased liquid stools but this is better.  Reports that she is drinking 2-3 boost high protein shakes daily.  Also has been drinking pedialyte as well.    Nutrition Focused Physical Exam: deferred today  Medications: marinol, donnatal  Labs: Na 129, Mag 1.5 (supplemented)  Anthropometrics:   Height: 61 inches Weight: 137 lb 14.4 oz  Noted 143 lb Jun 03, 2017  BMI: 26  4% weight loss in the last 4 months   Estimated Energy Needs  Kcals: 1550-1800 calories/d Protein: 78-90 g Fluid: 1.8 L/d  NUTRITION DIAGNOSIS: Inadequate oral intake related to treatment side effects as evidenced by 4% weight loss and poor appetite   MALNUTRITION DIAGNOSIS: continue to monitor   INTERVENTION:  Encouraged patient to take and utilize nausea medication Encouraged small frequent meals Encouraged high calorie, high protein oral nutrition supplements 2-3 times per day. Samples and coupons given today. Discussed ways to increase calories and protein.   Discussed foods to choose with colostomy and foods to thicken stool. Contact information provided.      MONITORING, EVALUATION, GOAL: Patient will consume adequate calories and  protein to meet nutritional needs and prevent further weight loss   NEXT VISIT: as needed, pt to contact  Tammie Ellsworth B. Zenia Resides, [place], [place] Registered Dietitian [phone] (pager)

## 2017-10-27 ENCOUNTER — Ambulatory Visit: Payer: Medicare Other

## 2017-10-27 ENCOUNTER — Inpatient Hospital Stay (HOSPITAL_BASED_OUTPATIENT_CLINIC_OR_DEPARTMENT_OTHER): Payer: Medicare Other | Admitting: Family

## 2017-10-27 ENCOUNTER — Ambulatory Visit
Admission: RE | Admit: 2017-10-27 | Discharge: 2017-10-27 | Disposition: A | Discharge status: Home / Self Care | Payer: Medicare Other | Source: Ambulatory Visit | Attending: Radiation Oncology | Admitting: Radiation Oncology

## 2017-10-27 ENCOUNTER — Other Ambulatory Visit: Payer: Self-pay

## 2017-10-27 ENCOUNTER — Inpatient Hospital Stay: Payer: Medicare Other

## 2017-10-27 VITALS — BP 166/50 | HR 74 | Temp 97.9°F | Resp 16 | Wt 138.0 lb

## 2017-10-27 DIAGNOSIS — E039 Hypothyroidism, unspecified: Secondary | ICD-10-CM

## 2017-10-27 DIAGNOSIS — R5383 Other fatigue: Secondary | ICD-10-CM

## 2017-10-27 DIAGNOSIS — K921 Melena: Secondary | ICD-10-CM | POA: Diagnosis not present

## 2017-10-27 DIAGNOSIS — M129 Arthropathy, unspecified: Secondary | ICD-10-CM | POA: Diagnosis not present

## 2017-10-27 DIAGNOSIS — R252 Cramp and spasm: Secondary | ICD-10-CM

## 2017-10-27 DIAGNOSIS — R197 Diarrhea, unspecified: Secondary | ICD-10-CM

## 2017-10-27 DIAGNOSIS — Z79899 Other long term (current) drug therapy: Secondary | ICD-10-CM

## 2017-10-27 DIAGNOSIS — K922 Gastrointestinal hemorrhage, unspecified: Secondary | ICD-10-CM

## 2017-10-27 DIAGNOSIS — C539 Malignant neoplasm of cervix uteri, unspecified: Secondary | ICD-10-CM

## 2017-10-27 DIAGNOSIS — D62 Acute posthemorrhagic anemia: Secondary | ICD-10-CM

## 2017-10-27 DIAGNOSIS — I2510 Atherosclerotic heart disease of native coronary artery without angina pectoris: Secondary | ICD-10-CM

## 2017-10-27 DIAGNOSIS — R634 Abnormal weight loss: Secondary | ICD-10-CM

## 2017-10-27 DIAGNOSIS — I10 Essential (primary) hypertension: Secondary | ICD-10-CM

## 2017-10-27 DIAGNOSIS — C541 Malignant neoplasm of endometrium: Secondary | ICD-10-CM

## 2017-10-27 DIAGNOSIS — R110 Nausea: Secondary | ICD-10-CM | POA: Diagnosis not present

## 2017-10-27 DIAGNOSIS — C799 Secondary malignant neoplasm of unspecified site: Principal | ICD-10-CM

## 2017-10-27 DIAGNOSIS — R531 Weakness: Secondary | ICD-10-CM

## 2017-10-27 DIAGNOSIS — E785 Hyperlipidemia, unspecified: Secondary | ICD-10-CM

## 2017-10-27 DIAGNOSIS — D508 Other iron deficiency anemias: Secondary | ICD-10-CM

## 2017-10-27 DIAGNOSIS — Z923 Personal history of irradiation: Secondary | ICD-10-CM

## 2017-10-27 DIAGNOSIS — E860 Dehydration: Secondary | ICD-10-CM

## 2017-10-27 LAB — CBC WITH DIFFERENTIAL (CANCER CENTER ONLY)
Basophils Absolute: 0 10*3/uL (ref 0.0–0.1)
Basophils Relative: 0 %
Eosinophils Absolute: 0.1 10*3/uL (ref 0.0–0.5)
Eosinophils Relative: 2 %
HCT: 31.3 % — ABNORMAL LOW (ref 34.8–46.6)
Hemoglobin: 10.3 g/dL — ABNORMAL LOW (ref 11.6–15.9)
Lymphocytes Relative: 6 %
Lymphs Abs: 0.3 10*3/uL — ABNORMAL LOW (ref 0.9–3.3)
MCH: 28.9 pg (ref 26.0–34.0)
MCHC: 32.9 g/dL (ref 32.0–36.0)
MCV: 87.7 fL (ref 81.0–101.0)
Monocytes Absolute: 0.5 10*3/uL (ref 0.1–0.9)
Monocytes Relative: 8 %
Neutro Abs: 5 10*3/uL (ref 1.5–6.5)
Neutrophils Relative %: 84 %
Platelet Count: 306 10*3/uL (ref 145–400)
RBC: 3.57 MIL/uL — ABNORMAL LOW (ref 3.70–5.32)
RDW: 15.7 % (ref 11.1–15.7)
WBC Count: 6 10*3/uL (ref 3.9–10.0)

## 2017-10-27 LAB — CMP (CANCER CENTER ONLY)
ALT: 20 U/L (ref 10–47)
AST: 19 U/L (ref 11–38)
Albumin: 3.2 g/dL — ABNORMAL LOW (ref 3.5–5.0)
Alkaline Phosphatase: 73 U/L (ref 26–84)
Anion gap: 8 (ref 5–15)
BUN: 11 mg/dL (ref 7–22)
CO2: 27 mmol/L (ref 18–33)
Calcium: 9.4 mg/dL (ref 8.0–10.3)
Chloride: 97 mmol/L — ABNORMAL LOW (ref 98–108)
Creatinine: 0.4 mg/dL — ABNORMAL LOW (ref 0.60–1.20)
Glucose, Bld: 135 mg/dL — ABNORMAL HIGH (ref 73–118)
Potassium: 3.6 mmol/L (ref 3.3–4.7)
Sodium: 132 mmol/L (ref 128–145)
Total Bilirubin: 0.5 mg/dL (ref 0.2–1.6)
Total Protein: 7 g/dL (ref 6.4–8.1)

## 2017-10-27 LAB — LACTATE DEHYDROGENASE: LDH: 142 U/L (ref 125–245)

## 2017-10-27 MED ORDER — FAMOTIDINE IN NACL 20-0.9 MG/50ML-% IV SOLN: 20.0000 mg | Freq: Once | INTRAVENOUS | Status: DC

## 2017-10-27 MED ORDER — SODIUM CHLORIDE 0.9 % IV SOLN
Freq: Once | INTRAVENOUS | Status: AC
  Administered 2017-10-27: 13:00:00 via INTRAVENOUS

## 2017-10-27 MED ORDER — SODIUM CHLORIDE 0.9 % IV SOLN
20.0000 mg | Freq: Once | INTRAVENOUS | Status: DC
  Filled 2017-10-27: qty 2

## 2017-10-27 MED ORDER — DIPHENHYDRAMINE HCL 50 MG/ML IJ SOLN: 50.0000 mg | Freq: Once | INTRAMUSCULAR | Status: DC

## 2017-10-27 MED ORDER — HEPARIN SOD (PORK) LOCK FLUSH 100 UNIT/ML IV SOLN
250.0000 [IU] | Freq: Once | INTRAVENOUS | Status: DC | PRN
  Filled 2017-10-27: qty 5

## 2017-10-27 MED ORDER — PACLITAXEL CHEMO INJECTION 300 MG/50ML
45.0000 mg/m2 | Freq: Once | INTRAVENOUS | Status: DC
  Filled 2017-10-27: qty 12

## 2017-10-27 MED ORDER — PALONOSETRON HCL INJECTION 0.25 MG/5ML: 0.2500 mg | Freq: Once | INTRAVENOUS | Status: DC

## 2017-10-27 MED ORDER — HEPARIN SOD (PORK) LOCK FLUSH 100 UNIT/ML IV SOLN
500.0000 [IU] | Freq: Once | INTRAVENOUS | Status: AC | PRN
  Administered 2017-10-27: 500 [IU]
  Filled 2017-10-27: qty 5

## 2017-10-27 MED ORDER — SODIUM CHLORIDE 0.9% FLUSH
10.0000 mL | INTRAVENOUS | Status: DC | PRN
  Filled 2017-10-27: qty 10

## 2017-10-27 MED ORDER — DIPHENHYDRAMINE HCL 50 MG/ML IJ SOLN
INTRAMUSCULAR | Status: AC
  Filled 2017-10-27: qty 1

## 2017-10-27 MED ORDER — PALONOSETRON HCL INJECTION 0.25 MG/5ML
INTRAVENOUS | Status: AC
  Filled 2017-10-27: qty 5

## 2017-10-27 MED ORDER — SODIUM CHLORIDE 0.9 % IV SOLN
148.0000 mg | INTRAVENOUS | Status: DC
  Filled 2017-10-27: qty 15

## 2017-10-27 MED ORDER — SODIUM CHLORIDE 0.9% FLUSH
10.0000 mL | INTRAVENOUS | Status: DC | PRN
  Administered 2017-10-27: 10 mL
  Filled 2017-10-27: qty 10

## 2017-10-27 MED ORDER — FAMOTIDINE IN NACL 20-0.9 MG/50ML-% IV SOLN
INTRAVENOUS | Status: AC
  Filled 2017-10-27: qty 50

## 2017-10-27 MED ORDER — SODIUM CHLORIDE 0.9 % IV SOLN
510.0000 mg | Freq: Once | INTRAVENOUS | Status: AC
  Administered 2017-10-27: 510 mg via INTRAVENOUS
  Filled 2017-10-27: qty 17

## 2017-10-27 MED ORDER — DEXAMETHASONE SODIUM PHOSPHATE 10 MG/ML IJ SOLN
INTRAMUSCULAR | Status: AC
Start: 2017-10-27 — End: ?
  Filled 2017-10-27: qty 2

## 2017-10-27 NOTE — Addendum Note (Signed)
Addended by: Randolm Idol on: 10/27/2017 01:21 PM   Modules accepted: Orders

## 2017-10-27 NOTE — Patient Instructions (Signed)
Dehydration, Adult Dehydration is when there is not enough fluid or water in your body. This happens when you lose more fluids than you take in. Dehydration can range from mild to very bad. It should be treated right away to keep it from getting very bad. Symptoms of mild dehydration may include:  Thirst.  Dry lips.  Slightly dry mouth.  Dry, warm skin.  Dizziness. Symptoms of moderate dehydration may include:  Very dry mouth.  Muscle cramps.  Dark pee (urine). Pee may be the color of tea.  Your body making less pee.  Your eyes making fewer tears.  Heartbeat that is uneven or faster than normal (palpitations).  Headache.  Light-headedness, especially when you stand up from sitting.  Fainting (syncope). Symptoms of very bad dehydration may include:  Changes in skin, such as: ? Cold and clammy skin. ? Blotchy (mottled) or pale skin. ? Skin that does not quickly return to normal after being lightly pinched and let go (poor skin turgor).  Changes in body fluids, such as: ? Feeling very thirsty. ? Your eyes making fewer tears. ? Not sweating when body temperature is high, such as in hot weather. ? Your body making very little pee.  Changes in vital signs, such as: ? Weak pulse. ? Pulse that is more than 100 beats a minute when you are sitting still. ? Fast breathing. ? Low blood pressure.  Other changes, such as: ? Sunken eyes. ? Cold hands and feet. ? Confusion. ? Lack of energy (lethargy). ? Trouble waking up from sleep. ? Short-term weight loss. ? Unconsciousness. Follow these instructions at home:  If told by your doctor, drink an ORS: ? Make an ORS by using instructions on the package. ? Start by drinking small amounts, about  cup (120 mL) every 5-10 minutes. ? Slowly drink more until you have had the amount that your doctor said to have.  Drink enough clear fluid to keep your pee clear or pale yellow. If you were told to drink an ORS, finish the ORS  first, then start slowly drinking clear fluids. Drink fluids such as: ? Water. Do not drink only water by itself. Doing that can make the salt (sodium) level in your body get too low (hyponatremia). ? Ice chips. ? Fruit juice that you have added water to (diluted). ? Low-calorie sports drinks.  Avoid: ? Alcohol. ? Drinks that have a lot of sugar. These include high-calorie sports drinks, fruit juice that does not have water added, and soda. ? Caffeine. ? Foods that are greasy or have a lot of fat or sugar.  Take over-the-counter and prescription medicines only as told by your doctor.  Do not take salt tablets. Doing that can make the salt level in your body get too high (hypernatremia).  Eat foods that have minerals (electrolytes). Examples include bananas, oranges, potatoes, tomatoes, and spinach.  Keep all follow-up visits as told by your doctor. This is important. Contact a doctor if:  You have belly (abdominal) pain that: ? Gets worse. ? Stays in one area (localizes).  You have a rash.  You have a stiff neck.  You get angry or annoyed more easily than normal (irritability).  You are more sleepy than normal.  You have a harder time waking up than normal.  You feel: ? Weak. ? Dizzy. ? Very thirsty.  You have peed (urinated) only a small amount of very dark pee during 6-8 hours. Get help right away if:  You have symptoms of   very bad dehydration.  You cannot drink fluids without throwing up (vomiting).  Your symptoms get worse with treatment.  You have a fever.  You have a very bad headache.  You are throwing up or having watery poop (diarrhea) and it: ? Gets worse. ? Does not go away.  You have blood or something green (bile) in your throw-up.  You have blood in your poop (stool). This may cause poop to look black and tarry.  You have not peed in 6-8 hours.  You pass out (faint).  Your heart rate when you are sitting still is more than 100 beats a  minute.  You have trouble breathing. This information is not intended to replace advice given to you by your health care provider. Make sure you discuss any questions you have with your health care provider. Document Released: 03/13/2009 Document Revised: 12/05/2015 Document Reviewed: 07/11/2015 Elsevier Interactive Patient Education  2018 [employer]vier Inc.  

## 2017-10-27 NOTE — Addendum Note (Signed)
Addended by: Cordelia Poche on: 10/27/2017 12:43 PM   Modules accepted: Orders

## 2017-10-27 NOTE — Progress Notes (Signed)
Hematology and Oncology Follow Up Visit  Shannon Scott [id number] 03/23/1941 77 y.o. 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 She was seen by symptom management at [facility] earlier this week and found to have a low magnesium level of 1.5. She received 4 g IV on 5/29. She also restarted  She is still feeling fatigued, weak, lightheadedness and does not have an appetite. She states that she has not been hydrating like she should. Weight is down 2 lbs since her last visit.  She has had a small amount of pale brown discharge from her rectum. Her ostomy is functioning appropriately.  No fever, chills, n/v, cough, rash, dizziness, SOB, chest pain, palpitations, abdominal pain or changes in bladder habits.  No lymphadenopathy noted on exam.  No episodes of bleeding, no bruising or petechiae.  No swelling, tenderness, numbness or tingling. No c/o pain.   ECOG Performance Status: 2 - Symptomatic, <50% confined to bed  Medications:  Allergies as of 10/27/2017      Reactions   Statins Other (See Comments)   Myalgias and memory problems   Ciprofloxacin Itching   Splotchy redness with itching during IV infusion localized to arm.      Medication List        Accurate as of 10/27/17 12:22 PM. Always use your most recent med list.          acetaminophen 500 MG tablet Commonly known as:  TYLENOL Take 500 mg by mouth every 6 (six) hours as needed for mild pain, moderate pain, fever or headache.   belladonna-PHENObarbital 16.2 MG/5ML Elix Commonly known as:  DONNATAL Take 5 mLs (16.2 mg total) by mouth 4 (four) times daily.   diclofenac sodium 1 % Gel Commonly known as:   VOLTAREN Apply 2 g topically 4 (four) times daily.   diltiazem 300 MG 24 hr capsule Commonly known as:  TIAZAC Take 300 mg by mouth daily.   dronabinol 2.5 MG capsule Commonly known as:  MARINOL Take 1 capsule (2.5 mg total) by mouth 2 (two) times daily before lunch and supper.   lactose free nutrition Liqd Take 237 mLs by mouth 3 (three) times daily with meals.   levothyroxine 50 MCG tablet Commonly known as:  SYNTHROID, LEVOTHROID Take 1 tablet (50 mcg total) by mouth daily before breakfast.   lidocaine-prilocaine cream Commonly known as:  EMLA Apply 1 application topically as needed.   losartan 100 MG tablet Commonly known as:  COZAAR Take 100 mg by mouth daily.   mirtazapine 15 MG tablet Commonly known as:  REMERON Take 1 tablet (15 mg total) by mouth at bedtime.   ondansetron 8 MG tablet Commonly known as:  ZOFRAN Take 1 tablet (8 mg total) by mouth every 8 (eight) hours as needed for nausea or vomiting.   pantoprazole 40 MG tablet Commonly known as:  PROTONIX Take 1 tablet (40 mg total) by mouth daily.   polyethylene glycol packet Commonly known as:  MIRALAX / GLYCOLAX Take 17 g by mouth daily.   prochlorperazine 10 MG tablet Commonly known as:  COMPAZINE Take 1 tablet (10 mg total) by mouth every 6 (six) hours as needed for nausea or vomiting.  senna-docusate 8.6-50 MG tablet Commonly known as:  Senokot-S Take 1 tablet by mouth 2 (two) times daily.   traMADol 50 MG tablet Commonly known as:  ULTRAM Take 1 tablet (50 mg total) by mouth every 6 (six) hours as needed for moderate pain.       Allergies:  Allergies  Allergen Reactions  . Statins Other (See Comments)    Myalgias and memory problems  . Ciprofloxacin Itching    Splotchy redness with itching during IV infusion localized to arm.    Past Medical History, Surgical history, Social history, and Family History were reviewed and updated.  Review of Systems: All other 10 point review of  systems is negative.   Physical Exam:  vitals were not taken for this visit.   Wt Readings from Last 3 Encounters:  10/25/17 137 lb 14.4 oz (62.6 kg)  10/20/17 139 lb (63 kg)  10/16/17 148 lb 9.4 oz (67.4 kg)    Ocular: Sclerae unicteric, pupils equal, round and reactive to light Ear-nose-throat: Oropharynx clear, dentition fair Lymphatic: No cervical, supraclavicular or axillary adenopathy Lungs no rales or rhonchi, good excursion bilaterally Heart regular rate and rhythm, no murmur appreciated Abd soft, nontender, positive bowel sounds, no liver or spleen tip palpated on exam, no fluid wave  MSK no focal spinal tenderness, no joint edema Neuro: non-focal, well-oriented, appropriate affect Breasts: Deferred  Lab Results  Component Value Date   WBC 4.5 10/25/2017   HGB 10.4 (L) 10/25/2017   HCT 30.4 (L) 10/25/2017   MCV 84.5 10/25/2017   PLT 308 10/25/2017   Lab Results  Component Value Date   FERRITIN 1,846 (H) 10/20/2017   IRON 33 (L) 10/20/2017   TIBC 191 (L) 10/20/2017   UIBC 158 10/20/2017   IRONPCTSAT 17 (L) 10/20/2017   Lab Results  Component Value Date   RETICCTPCT 0.5 (L) 10/20/2017   RBC 3.60 (L) 10/25/2017   No results found for: KPAFRELGTCHN, LAMBDASER, KAPLAMBRATIO No results found for: IGGSERUM, IGA, IGMSERUM No results found for: Ronnald Ramp, A1GS, A2GS, Violet Baldy, MSPIKE, SPEI   Chemistry      Component Value Date/Time   NA 129 (L) 10/25/2017 1356   NA 136 (A) 09/08/2017   K 4.0 10/25/2017 1356   CL 98 10/25/2017 1356   CO2 22 10/25/2017 1356   BUN 13 10/25/2017 1356   BUN 8 09/08/2017   CREATININE 0.74 10/25/2017 1356   CREATININE 0.69 08/02/2016 1519   GLU 111 09/08/2017      Component Value Date/Time   CALCIUM 9.3 10/25/2017 1356   ALKPHOS 67 10/25/2017 1356   AST 12 10/25/2017 1356   ALT 10 10/25/2017 1356   BILITOT 0.2 10/25/2017 1356      Impression and Plan: Shannon Scott is a very pleasant 77 yo  caucasian female with recurrent undifferentiated endometrial carcinoma. She is in the midst of her treatment with radiation and chemo (Taxol/Carbo).  She had hypomagnesemia earlier this week and was given 4 mg of Magnesium IV.  She is symptomatic at this time with fatigue, weakness, lightheadedness and loss of appetite.  I spoke with Dr. Marin Olp and we will hold chemo today. We will give her IV fluids, iron and recheck her magnesium level.  They are in agreement with the plan. We will see her back next week for follow-up and hopefully be able to resume treatment at that time.   Laverna Peace, NP 5/30/201912:22 PM

## 2017-10-28 ENCOUNTER — Ambulatory Visit
Admission: RE | Admit: 2017-10-28 | Discharge: 2017-10-28 | Disposition: A | Discharge status: Home / Self Care | Payer: Medicare Other | Source: Ambulatory Visit | Attending: Radiation Oncology | Admitting: Radiation Oncology

## 2017-10-28 ENCOUNTER — Ambulatory Visit: Payer: Medicare Other

## 2017-10-28 ENCOUNTER — Telehealth: Payer: Self-pay | Admitting: Hematology & Oncology

## 2017-10-28 DIAGNOSIS — C541 Malignant neoplasm of endometrium: Secondary | ICD-10-CM | POA: Diagnosis not present

## 2017-10-28 LAB — IRON AND TIBC
Iron: 37 ug/dL — ABNORMAL LOW (ref 41–142)
Saturation Ratios: 18 % — ABNORMAL LOW (ref 21–57)
TIBC: 209 ug/dL — ABNORMAL LOW (ref 236–444)
UIBC: 172 ug/dL

## 2017-10-28 LAB — FERRITIN: Ferritin: 878 ng/mL — ABNORMAL HIGH (ref 9–269)

## 2017-10-28 NOTE — Telephone Encounter (Signed)
NO [place] MSG FOR PATIENT OFFICE VISIT 5/29. PT HAS LAB/INFUSION APPTS ONLY SCHEDULED

## 2017-10-31 ENCOUNTER — Ambulatory Visit
Admission: RE | Admit: 2017-10-31 | Discharge: 2017-10-31 | Disposition: A | Discharge status: Home / Self Care | Payer: Medicare Other | Source: Ambulatory Visit | Attending: Radiation Oncology | Admitting: Radiation Oncology

## 2017-10-31 ENCOUNTER — Ambulatory Visit: Payer: Medicare Other

## 2017-10-31 DIAGNOSIS — Z510 Encounter for antineoplastic radiation therapy: Secondary | ICD-10-CM | POA: Diagnosis not present

## 2017-10-31 DIAGNOSIS — C541 Malignant neoplasm of endometrium: Secondary | ICD-10-CM | POA: Insufficient documentation

## 2017-11-01 ENCOUNTER — Ambulatory Visit
Admission: RE | Admit: 2017-11-01 | Discharge: 2017-11-01 | Disposition: A | Discharge status: Home / Self Care | Payer: Medicare Other | Source: Ambulatory Visit | Attending: Radiation Oncology | Admitting: Radiation Oncology

## 2017-11-01 ENCOUNTER — Ambulatory Visit: Payer: Medicare Other

## 2017-11-01 DIAGNOSIS — Z510 Encounter for antineoplastic radiation therapy: Secondary | ICD-10-CM | POA: Diagnosis not present

## 2017-11-02 ENCOUNTER — Ambulatory Visit
Admission: RE | Admit: 2017-11-02 | Discharge: 2017-11-02 | Disposition: A | Discharge status: Home / Self Care | Payer: Medicare Other | Source: Ambulatory Visit | Attending: Radiation Oncology | Admitting: Radiation Oncology

## 2017-11-02 ENCOUNTER — Ambulatory Visit: Payer: Medicare Other

## 2017-11-02 DIAGNOSIS — Z510 Encounter for antineoplastic radiation therapy: Secondary | ICD-10-CM | POA: Diagnosis not present

## 2017-11-03 ENCOUNTER — Ambulatory Visit: Admission: RE | Admit: 2017-11-03 | Payer: Medicare Other | Source: Ambulatory Visit

## 2017-11-03 ENCOUNTER — Inpatient Hospital Stay: Payer: Medicare Other

## 2017-11-03 ENCOUNTER — Other Ambulatory Visit: Payer: Self-pay

## 2017-11-03 ENCOUNTER — Encounter: Payer: Self-pay | Admitting: Family

## 2017-11-03 ENCOUNTER — Inpatient Hospital Stay (HOSPITAL_BASED_OUTPATIENT_CLINIC_OR_DEPARTMENT_OTHER): Payer: Medicare Other | Admitting: Family

## 2017-11-03 ENCOUNTER — Ambulatory Visit: Payer: Medicare Other

## 2017-11-03 ENCOUNTER — Inpatient Hospital Stay: Payer: Medicare Other | Attending: Hematology & Oncology

## 2017-11-03 VITALS — BP 136/43 | HR 70 | Temp 98.2°F | Resp 16 | Wt 138.0 lb

## 2017-11-03 DIAGNOSIS — Z79899 Other long term (current) drug therapy: Secondary | ICD-10-CM | POA: Insufficient documentation

## 2017-11-03 DIAGNOSIS — D508 Other iron deficiency anemias: Secondary | ICD-10-CM

## 2017-11-03 DIAGNOSIS — R978 Other abnormal tumor markers: Secondary | ICD-10-CM | POA: Diagnosis not present

## 2017-11-03 DIAGNOSIS — Z5111 Encounter for antineoplastic chemotherapy: Secondary | ICD-10-CM | POA: Diagnosis not present

## 2017-11-03 DIAGNOSIS — C541 Malignant neoplasm of endometrium: Secondary | ICD-10-CM | POA: Insufficient documentation

## 2017-11-03 DIAGNOSIS — R970 Elevated carcinoembryonic antigen [CEA]: Secondary | ICD-10-CM | POA: Insufficient documentation

## 2017-11-03 DIAGNOSIS — Z923 Personal history of irradiation: Secondary | ICD-10-CM | POA: Insufficient documentation

## 2017-11-03 DIAGNOSIS — R197 Diarrhea, unspecified: Secondary | ICD-10-CM | POA: Insufficient documentation

## 2017-11-03 DIAGNOSIS — Z933 Colostomy status: Secondary | ICD-10-CM | POA: Diagnosis not present

## 2017-11-03 DIAGNOSIS — E860 Dehydration: Secondary | ICD-10-CM

## 2017-11-03 LAB — CMP (CANCER CENTER ONLY)
ALT: 19 U/L (ref 10–47)
AST: 20 U/L (ref 11–38)
Albumin: 3.2 g/dL — ABNORMAL LOW (ref 3.5–5.0)
Alkaline Phosphatase: 73 U/L (ref 26–84)
Anion gap: 10 (ref 5–15)
BUN: 13 mg/dL (ref 7–22)
CO2: 28 mmol/L (ref 18–33)
Calcium: 9.5 mg/dL (ref 8.0–10.3)
Chloride: 94 mmol/L — ABNORMAL LOW (ref 98–108)
Creatinine: 0.8 mg/dL (ref 0.60–1.20)
Glucose, Bld: 114 mg/dL (ref 73–118)
Potassium: 3.6 mmol/L (ref 3.3–4.7)
Sodium: 132 mmol/L (ref 128–145)
Total Bilirubin: 0.5 mg/dL (ref 0.2–1.6)
Total Protein: 7.2 g/dL (ref 6.4–8.1)

## 2017-11-03 LAB — CBC WITH DIFFERENTIAL (CANCER CENTER ONLY)
Basophils Absolute: 0.1 10*3/uL (ref 0.0–0.1)
Basophils Relative: 1 %
Eosinophils Absolute: 0.4 10*3/uL (ref 0.0–0.5)
Eosinophils Relative: 6 %
HCT: 30.9 % — ABNORMAL LOW (ref 34.8–46.6)
Hemoglobin: 10.3 g/dL — ABNORMAL LOW (ref 11.6–15.9)
Lymphocytes Relative: 5 %
Lymphs Abs: 0.3 10*3/uL — ABNORMAL LOW (ref 0.9–3.3)
MCH: 29.3 pg (ref 26.0–34.0)
MCHC: 33.3 g/dL (ref 32.0–36.0)
MCV: 88 fL (ref 81.0–101.0)
Monocytes Absolute: 0.6 10*3/uL (ref 0.1–0.9)
Monocytes Relative: 9 %
Neutro Abs: 5.2 10*3/uL (ref 1.5–6.5)
Neutrophils Relative %: 79 %
Platelet Count: 241 10*3/uL (ref 145–400)
RBC: 3.51 MIL/uL — ABNORMAL LOW (ref 3.70–5.32)
RDW: 16.6 % — ABNORMAL HIGH (ref 11.1–15.7)
WBC Count: 6.5 10*3/uL (ref 3.9–10.0)

## 2017-11-03 LAB — MAGNESIUM: Magnesium: 1.8 mg/dL (ref 1.7–2.4)

## 2017-11-03 LAB — LACTATE DEHYDROGENASE: LDH: 135 U/L (ref 125–245)

## 2017-11-03 MED ORDER — FAMOTIDINE IN NACL 20-0.9 MG/50ML-% IV SOLN
INTRAVENOUS | Status: AC
  Filled 2017-11-03: qty 50

## 2017-11-03 MED ORDER — SODIUM CHLORIDE 0.9 % IV SOLN
Freq: Once | INTRAVENOUS | Status: AC
  Administered 2017-11-03: 14:00:00 via INTRAVENOUS

## 2017-11-03 MED ORDER — SODIUM CHLORIDE 0.9% FLUSH
10.0000 mL | INTRAVENOUS | Status: DC | PRN
  Administered 2017-11-03: 10 mL
  Filled 2017-11-03: qty 10

## 2017-11-03 MED ORDER — DIPHENHYDRAMINE HCL 50 MG/ML IJ SOLN
INTRAMUSCULAR | Status: AC
  Filled 2017-11-03: qty 1

## 2017-11-03 MED ORDER — FAMOTIDINE IN NACL 20-0.9 MG/50ML-% IV SOLN
20.0000 mg | Freq: Once | INTRAVENOUS | Status: AC
  Administered 2017-11-03: 20 mg via INTRAVENOUS

## 2017-11-03 MED ORDER — HEPARIN SOD (PORK) LOCK FLUSH 100 UNIT/ML IV SOLN
250.0000 [IU] | Freq: Once | INTRAVENOUS | Status: DC | PRN
  Filled 2017-11-03: qty 5

## 2017-11-03 MED ORDER — DIPHENHYDRAMINE HCL 50 MG/ML IJ SOLN
50.0000 mg | Freq: Once | INTRAMUSCULAR | Status: AC
  Administered 2017-11-03: 50 mg via INTRAVENOUS

## 2017-11-03 MED ORDER — SODIUM CHLORIDE 0.9 % IV SOLN
146.4000 mg | INTRAVENOUS | Status: DC
  Administered 2017-11-03: 150 mg via INTRAVENOUS
  Filled 2017-11-03: qty 15

## 2017-11-03 MED ORDER — SODIUM CHLORIDE 0.9 % IV SOLN
20.0000 mg | Freq: Once | INTRAVENOUS | Status: AC
  Administered 2017-11-03: 20 mg via INTRAVENOUS
  Filled 2017-11-03: qty 2

## 2017-11-03 MED ORDER — SODIUM CHLORIDE 0.9 % IJ SOLN
10.0000 mL | Freq: Once | INTRAMUSCULAR | Status: AC
  Administered 2017-11-03: 10 mL
  Filled 2017-11-03: qty 10

## 2017-11-03 MED ORDER — SODIUM CHLORIDE 0.9 % IV SOLN
45.0000 mg/m2 | Freq: Once | INTRAVENOUS | Status: AC
  Administered 2017-11-03: 72 mg via INTRAVENOUS
  Filled 2017-11-03: qty 12

## 2017-11-03 MED ORDER — PALONOSETRON HCL INJECTION 0.25 MG/5ML
INTRAVENOUS | Status: AC
  Filled 2017-11-03: qty 5

## 2017-11-03 MED ORDER — PALONOSETRON HCL INJECTION 0.25 MG/5ML
0.2500 mg | Freq: Once | INTRAVENOUS | Status: AC
  Administered 2017-11-03: 0.25 mg via INTRAVENOUS

## 2017-11-03 MED ORDER — HEPARIN SOD (PORK) LOCK FLUSH 100 UNIT/ML IV SOLN
500.0000 [IU] | Freq: Once | INTRAVENOUS | Status: AC | PRN
  Administered 2017-11-03: 500 [IU]
  Filled 2017-11-03: qty 5

## 2017-11-03 NOTE — Progress Notes (Signed)
Hematology and Oncology Follow Up Visit  Shannon Scott [id number] [birth year] 77 y.o. 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 She is feeling much better today than last week.  Her energy has improved. She still does not have much a of an appetite but she is trying her best to stay hydrated. We will give her a little more fluid today on top of what she gets with treatment.  Her weight is stable.  Her night sweats seem to be improved as well.  No fever, chills, n/v, cough, rash, dizziness, SOB, chest pain, palpitations, abdominal pain or changes in bowel or bladder habits.  She take Mirilax as needed and states that her ostomy is working appropriately.  No lymphadenopathy noted on exam.  No episodes of bleeding, no bruising or petechiae.  No swelling, tenderness, numbness or tingling in her extremities at this time.   ECOG Performance Status: 1 - Symptomatic but completely ambulatory  Medications:  Allergies as of 11/03/2017      Reactions   Statins Other (See Comments)   Myalgias and memory problems   Ciprofloxacin Itching   Splotchy redness with itching during IV infusion localized to arm.      Medication List        Accurate as of 11/03/17  1:21 PM. Always use your most recent med list.          acetaminophen 500 MG tablet Commonly known as:  TYLENOL Take 500 mg by mouth every 6 (six) hours as needed for mild pain, moderate pain, fever or headache.   belladonna-PHENObarbital 16.2 MG/5ML Elix Commonly known as:  DONNATAL Take 5 mLs (16.2 mg total) by mouth 4 (four) times daily.   diclofenac sodium 1 % Gel Commonly known as:  VOLTAREN Apply 2 g topically 4 (four)  times daily.   diltiazem 300 MG 24 hr capsule Commonly known as:  TIAZAC Take 300 mg by mouth daily.   dronabinol 2.5 MG capsule Commonly known as:  MARINOL Take 1 capsule (2.5 mg total) by mouth 2 (two) times daily before lunch and supper.   lactose free nutrition Liqd Take 237 mLs by mouth 3 (three) times daily with meals.   levothyroxine 50 MCG tablet Commonly known as:  SYNTHROID, LEVOTHROID Take 1 tablet (50 mcg total) by mouth daily before breakfast.   lidocaine-prilocaine cream Commonly known as:  EMLA Apply 1 application topically as needed.   loperamide 2 MG capsule Commonly known as:  IMODIUM Take by mouth as needed for diarrhea or loose stools.   losartan 100 MG tablet Commonly known as:  COZAAR Take 100 mg by mouth daily.   mirtazapine 15 MG tablet Commonly known as:  REMERON Take 1 tablet (15 mg total) by mouth at bedtime.   ondansetron 8 MG tablet Commonly known as:  ZOFRAN Take 1 tablet (8 mg total) by mouth every 8 (eight) hours as needed for nausea or vomiting.   pantoprazole 40 MG tablet Commonly known as:  PROTONIX Take 1 tablet (40 mg total) by mouth daily.   polyethylene glycol packet Commonly known as:  MIRALAX / GLYCOLAX Take 17 g by mouth daily.   prochlorperazine 10 MG tablet Commonly known as:  COMPAZINE Take 1 tablet (10  mg total) by mouth every 6 (six) hours as needed for nausea or vomiting.   senna-docusate 8.6-50 MG tablet Commonly known as:  Senokot-S Take 1 tablet by mouth 2 (two) times daily.   traMADol 50 MG tablet Commonly known as:  ULTRAM Take 1 tablet (50 mg total) by mouth every 6 (six) hours as needed for moderate pain.       Allergies:  Allergies  Allergen Reactions  . Statins Other (See Comments)    Myalgias and memory problems  . Ciprofloxacin Itching    Splotchy redness with itching during IV infusion localized to arm.    Past Medical History, Surgical history, Social history, and Family History were  reviewed and updated.  Review of Systems: All other 10 point review of systems is negative.   Physical Exam:  vitals were not taken for this visit.   Wt Readings from Last 3 Encounters:  10/27/17 138 lb (62.6 kg)  10/25/17 137 lb 14.4 oz (62.6 kg)  10/20/17 139 lb (63 kg)    Ocular: Sclerae unicteric, pupils equal, round and reactive to light Ear-nose-throat: Oropharynx clear, dentition fair Lymphatic: No cervical, supraclavicular or axillary adenopathy Lungs no rales or rhonchi, good excursion bilaterally Heart regular rate and rhythm, no murmur appreciated Abd soft, nontender, positive bowel sounds, no liver or spleen tip palpated on exam, no fluid wave  MSK no focal spinal tenderness, no joint edema Neuro: non-focal, well-oriented, appropriate affect Breasts: Deferred   Lab Results  Component Value Date   WBC 6.5 11/03/2017   HGB 10.3 (L) 11/03/2017   HCT 30.9 (L) 11/03/2017   MCV 88.0 11/03/2017   PLT 241 11/03/2017   Lab Results  Component Value Date   FERRITIN 878 (H) 10/27/2017   IRON 37 (L) 10/27/2017   TIBC 209 (L) 10/27/2017   UIBC 172 10/27/2017   IRONPCTSAT 18 (L) 10/27/2017   Lab Results  Component Value Date   RETICCTPCT 0.5 (L) 10/20/2017   RBC 3.51 (L) 11/03/2017   No results found for: KPAFRELGTCHN, LAMBDASER, KAPLAMBRATIO No results found for: IGGSERUM, IGA, IGMSERUM No results found for: Kathrynn Ducking, MSPIKE, SPEI   Chemistry      Component Value Date/Time   NA 132 10/27/2017 1210   NA 136 (A) 09/08/2017   K 3.6 10/27/2017 1210   CL 97 (L) 10/27/2017 1210   CO2 27 10/27/2017 1210   BUN 11 10/27/2017 1210   BUN 8 09/08/2017   CREATININE 0.40 (L) 10/27/2017 1210   CREATININE 0.69 08/02/2016 1519   GLU 111 09/08/2017      Component Value Date/Time   CALCIUM 9.4 10/27/2017 1210   ALKPHOS 73 10/27/2017 1210   AST 19 10/27/2017 1210   ALT 20 10/27/2017 1210   BILITOT 0.5 10/27/2017 1210       Impression and Plan: Shannon Scott is a very pleasant 77 yo caucasian female with recurrent undifferentiated endometrial carcinoma. She is feeling much better today and has no complaints at this time.  She is doing well with radiation. We will proceed with treatment today per Dr. Marin Olp.  We will also give her another 250 ml of fluids along with what she gets with treatment. She is trying to stay well hydrated.  Magnesium level is pending.  We will plan to see her back in another week for follow-up and treatment.  They will contact our office with any questions or concerns. We can certainly see her sooner if need be.   Judson Roch  [place], NP 6/6/20191:21 PM

## 2017-11-03 NOTE — Patient Instructions (Signed)
Implanted Port Home Guide An implanted port is a type of central line that is placed under the skin. Central lines are used to provide IV access when treatment or nutrition needs to be given through a person's veins. Implanted ports are used for long-term IV access. An implanted port may be placed because:  You need IV medicine that would be irritating to the small veins in your hands or arms.  You need long-term IV medicines, such as antibiotics.  You need IV nutrition for a long period.  You need frequent blood draws for lab tests.  You need dialysis.  Implanted ports are usually placed in the chest area, but they can also be placed in the upper arm, the abdomen, or the leg. An implanted port has two main parts:  Reservoir. The reservoir is round and will appear as a small, raised area under your skin. The reservoir is the part where a needle is inserted to give medicines or draw blood.  Catheter. The catheter is a thin, flexible tube that extends from the reservoir. The catheter is placed into a large vein. Medicine that is inserted into the reservoir goes into the catheter and then into the vein.  How will I care for my incision site? Do not get the incision site wet. Bathe or shower as directed by your health care provider. How is my port accessed? Special steps must be taken to access the port:  Before the port is accessed, a numbing cream can be placed on the skin. This helps numb the skin over the port site.  Your health care provider uses a sterile technique to access the port. ? Your health care provider must put on a mask and sterile gloves. ? The skin over your port is cleaned carefully with an antiseptic and allowed to dry. ? The port is gently pinched between sterile gloves, and a needle is inserted into the port.  Only "non-coring" port needles should be used to access the port. Once the port is accessed, a blood return should be checked. This helps ensure that the port  is in the vein and is not clogged.  If your port needs to remain accessed for a constant infusion, a clear (transparent) bandage will be placed over the needle site. The bandage and needle will need to be changed every week, or as directed by your health care provider.  Keep the bandage covering the needle clean and dry. Do not get it wet. Follow your health care provider's instructions on how to take a shower or bath while the port is accessed.  If your port does not need to stay accessed, no bandage is needed over the port.  What is flushing? Flushing helps keep the port from getting clogged. Follow your health care provider's instructions on how and when to flush the port. Ports are usually flushed with saline solution or a medicine called heparin. The need for flushing will depend on how the port is used.  If the port is used for intermittent medicines or blood draws, the port will need to be flushed: ? After medicines have been given. ? After blood has been drawn. ? As part of routine maintenance.  If a constant infusion is running, the port may not need to be flushed.  How long will my port stay implanted? The port can stay in for as long as your health care provider thinks it is needed. When it is time for the port to come out, surgery will be   done to remove it. The procedure is similar to the one performed when the port was put in. When should I seek immediate medical care? When you have an implanted port, you should seek immediate medical care if:  You notice a bad smell coming from the incision site.  You have swelling, redness, or drainage at the incision site.  You have more swelling or pain at the port site or the surrounding area.  You have a fever that is not controlled with medicine.  This information is not intended to replace advice given to you by your health care provider. Make sure you discuss any questions you have with your health care provider. Document  Released: 05/17/2005 Document Revised: 10/23/2015 Document Reviewed: 01/22/2013 Elsevier Interactive Patient Education  2017 Elsevier Inc.  

## 2017-11-03 NOTE — Patient Instructions (Signed)
[facility] Discharge Instructions for Patients Receiving Chemotherapy  Today you received the following chemotherapy agents Taxol/Carboplatin  To help prevent nausea and vomiting after your treatment, we encourage you to take your nausea medication as prescribed.   If you develop nausea and vomiting that is not controlled by your nausea medication, call the clinic.   BELOW ARE SYMPTOMS THAT SHOULD BE REPORTED IMMEDIATELY:  *FEVER GREATER THAN 100.5 F  *CHILLS WITH OR WITHOUT FEVER  NAUSEA AND VOMITING THAT IS NOT CONTROLLED WITH YOUR NAUSEA MEDICATION  *UNUSUAL SHORTNESS OF BREATH  *UNUSUAL BRUISING OR BLEEDING  TENDERNESS IN MOUTH AND THROAT WITH OR WITHOUT PRESENCE OF ULCERS  *URINARY PROBLEMS  *BOWEL PROBLEMS  UNUSUAL RASH Items with * indicate a potential emergency and should be followed up as soon as possible.  Feel free to call the clinic should you have any questions or concerns. The clinic phone number is [phone].  Please show the CHEMO ALERT CARD at check-in to the Emergency Department and triage nurse.   

## 2017-11-04 ENCOUNTER — Ambulatory Visit: Payer: Medicare Other

## 2017-11-04 ENCOUNTER — Ambulatory Visit
Admission: RE | Admit: 2017-11-04 | Discharge: 2017-11-04 | Disposition: A | Discharge status: Home / Self Care | Payer: Medicare Other | Source: Ambulatory Visit | Attending: Radiation Oncology | Admitting: Radiation Oncology

## 2017-11-04 DIAGNOSIS — Z510 Encounter for antineoplastic radiation therapy: Secondary | ICD-10-CM | POA: Diagnosis not present

## 2017-11-07 ENCOUNTER — Ambulatory Visit: Payer: Medicare Other

## 2017-11-07 ENCOUNTER — Ambulatory Visit
Admission: RE | Admit: 2017-11-07 | Discharge: 2017-11-07 | Disposition: A | Discharge status: Home / Self Care | Payer: Medicare Other | Source: Ambulatory Visit | Attending: Radiation Oncology | Admitting: Radiation Oncology

## 2017-11-07 DIAGNOSIS — Z510 Encounter for antineoplastic radiation therapy: Secondary | ICD-10-CM | POA: Diagnosis not present

## 2017-11-08 ENCOUNTER — Ambulatory Visit
Admission: RE | Admit: 2017-11-08 | Discharge: 2017-11-08 | Disposition: A | Discharge status: Home / Self Care | Payer: Medicare Other | Source: Ambulatory Visit | Attending: Radiation Oncology | Admitting: Radiation Oncology

## 2017-11-08 ENCOUNTER — Ambulatory Visit: Payer: Medicare Other

## 2017-11-08 DIAGNOSIS — Z510 Encounter for antineoplastic radiation therapy: Secondary | ICD-10-CM | POA: Diagnosis not present

## 2017-11-09 ENCOUNTER — Ambulatory Visit: Payer: Medicare Other

## 2017-11-09 ENCOUNTER — Other Ambulatory Visit: Payer: Self-pay | Admitting: Internal Medicine

## 2017-11-09 ENCOUNTER — Ambulatory Visit
Admission: RE | Admit: 2017-11-09 | Discharge: 2017-11-09 | Disposition: A | Discharge status: Home / Self Care | Payer: Medicare Other | Source: Ambulatory Visit | Attending: Radiation Oncology | Admitting: Radiation Oncology

## 2017-11-09 DIAGNOSIS — Z510 Encounter for antineoplastic radiation therapy: Secondary | ICD-10-CM | POA: Diagnosis not present

## 2017-11-10 ENCOUNTER — Inpatient Hospital Stay: Payer: Medicare Other

## 2017-11-10 ENCOUNTER — Other Ambulatory Visit: Payer: Self-pay | Admitting: *Deleted

## 2017-11-10 ENCOUNTER — Other Ambulatory Visit: Payer: Self-pay

## 2017-11-10 ENCOUNTER — Encounter: Payer: Self-pay | Admitting: Hematology & Oncology

## 2017-11-10 ENCOUNTER — Ambulatory Visit
Admission: RE | Admit: 2017-11-10 | Discharge: 2017-11-10 | Disposition: A | Discharge status: Home / Self Care | Payer: Medicare Other | Source: Ambulatory Visit | Attending: Radiation Oncology | Admitting: Radiation Oncology

## 2017-11-10 ENCOUNTER — Inpatient Hospital Stay (HOSPITAL_BASED_OUTPATIENT_CLINIC_OR_DEPARTMENT_OTHER): Payer: Medicare Other | Admitting: Hematology & Oncology

## 2017-11-10 ENCOUNTER — Ambulatory Visit: Payer: Medicare Other

## 2017-11-10 VITALS — BP 139/47 | HR 74 | Temp 98.3°F | Resp 18 | Wt 136.0 lb

## 2017-11-10 DIAGNOSIS — Z510 Encounter for antineoplastic radiation therapy: Secondary | ICD-10-CM | POA: Diagnosis not present

## 2017-11-10 DIAGNOSIS — Z923 Personal history of irradiation: Secondary | ICD-10-CM | POA: Diagnosis not present

## 2017-11-10 DIAGNOSIS — C541 Malignant neoplasm of endometrium: Secondary | ICD-10-CM

## 2017-11-10 DIAGNOSIS — C799 Secondary malignant neoplasm of unspecified site: Secondary | ICD-10-CM

## 2017-11-10 DIAGNOSIS — Z933 Colostomy status: Secondary | ICD-10-CM

## 2017-11-10 DIAGNOSIS — D508 Other iron deficiency anemias: Secondary | ICD-10-CM

## 2017-11-10 DIAGNOSIS — Z79899 Other long term (current) drug therapy: Secondary | ICD-10-CM | POA: Diagnosis not present

## 2017-11-10 DIAGNOSIS — C539 Malignant neoplasm of cervix uteri, unspecified: Secondary | ICD-10-CM

## 2017-11-10 DIAGNOSIS — R300 Dysuria: Secondary | ICD-10-CM

## 2017-11-10 DIAGNOSIS — Z5111 Encounter for antineoplastic chemotherapy: Secondary | ICD-10-CM | POA: Diagnosis not present

## 2017-11-10 DIAGNOSIS — E860 Dehydration: Secondary | ICD-10-CM

## 2017-11-10 LAB — URINALYSIS, COMPLETE (UACMP) WITH MICROSCOPIC
Bilirubin Urine: NEGATIVE
Glucose, UA: NEGATIVE mg/dL
Ketones, ur: NEGATIVE mg/dL
Nitrite: NEGATIVE
Protein, ur: 30 mg/dL — AB
Specific Gravity, Urine: 1.015 (ref 1.005–1.030)
pH: 6 (ref 5.0–8.0)

## 2017-11-10 LAB — CMP (CANCER CENTER ONLY)
ALT: 12 U/L (ref 10–47)
AST: 18 U/L (ref 11–38)
Albumin: 3.2 g/dL — ABNORMAL LOW (ref 3.5–5.0)
Alkaline Phosphatase: 75 U/L (ref 26–84)
Anion gap: 13 (ref 5–15)
BUN: 13 mg/dL (ref 7–22)
CO2: 28 mmol/L (ref 18–33)
Calcium: 9.4 mg/dL (ref 8.0–10.3)
Chloride: 96 mmol/L — ABNORMAL LOW (ref 98–108)
Creatinine: 0.8 mg/dL (ref 0.60–1.20)
Glucose, Bld: 119 mg/dL — ABNORMAL HIGH (ref 73–118)
Potassium: 3.7 mmol/L (ref 3.3–4.7)
Sodium: 137 mmol/L (ref 128–145)
Total Bilirubin: 0.5 mg/dL (ref 0.2–1.6)
Total Protein: 7.3 g/dL (ref 6.4–8.1)

## 2017-11-10 LAB — CBC WITH DIFFERENTIAL (CANCER CENTER ONLY)
Basophils Absolute: 0 10*3/uL (ref 0.0–0.1)
Basophils Relative: 0 %
Eosinophils Absolute: 0.5 10*3/uL (ref 0.0–0.5)
Eosinophils Relative: 9 %
HCT: 30.9 % — ABNORMAL LOW (ref 34.8–46.6)
Hemoglobin: 10.3 g/dL — ABNORMAL LOW (ref 11.6–15.9)
Lymphocytes Relative: 3 %
Lymphs Abs: 0.2 10*3/uL — ABNORMAL LOW (ref 0.9–3.3)
MCH: 29.7 pg (ref 26.0–34.0)
MCHC: 33.3 g/dL (ref 32.0–36.0)
MCV: 89 fL (ref 81.0–101.0)
Monocytes Absolute: 0.3 10*3/uL (ref 0.1–0.9)
Monocytes Relative: 6 %
Neutro Abs: 4.7 10*3/uL (ref 1.5–6.5)
Neutrophils Relative %: 82 %
Platelet Count: 194 10*3/uL (ref 145–400)
RBC: 3.47 MIL/uL — ABNORMAL LOW (ref 3.70–5.32)
RDW: 17.1 % — ABNORMAL HIGH (ref 11.1–15.7)
WBC Count: 5.7 10*3/uL (ref 3.9–10.0)

## 2017-11-10 LAB — MAGNESIUM: Magnesium: 1.7 mg/dL (ref 1.7–2.4)

## 2017-11-10 LAB — LACTATE DEHYDROGENASE: LDH: 123 U/L — ABNORMAL LOW (ref 125–245)

## 2017-11-10 MED ORDER — PALONOSETRON HCL INJECTION 0.25 MG/5ML
0.2500 mg | Freq: Once | INTRAVENOUS | Status: AC
  Administered 2017-11-10: 0.25 mg via INTRAVENOUS

## 2017-11-10 MED ORDER — SODIUM CHLORIDE 0.9 % IV SOLN
510.0000 mg | Freq: Once | INTRAVENOUS | Status: AC
  Administered 2017-11-10: 510 mg via INTRAVENOUS
  Filled 2017-11-10: qty 17

## 2017-11-10 MED ORDER — FAMOTIDINE IN NACL 20-0.9 MG/50ML-% IV SOLN
20.0000 mg | Freq: Once | INTRAVENOUS | Status: AC
  Administered 2017-11-10: 20 mg via INTRAVENOUS

## 2017-11-10 MED ORDER — PALONOSETRON HCL INJECTION 0.25 MG/5ML
INTRAVENOUS | Status: AC
  Filled 2017-11-10: qty 5

## 2017-11-10 MED ORDER — SODIUM CHLORIDE 0.9 % IV SOLN
146.4000 mg | INTRAVENOUS | Status: DC
  Administered 2017-11-10: 150 mg via INTRAVENOUS
  Filled 2017-11-10: qty 15

## 2017-11-10 MED ORDER — FAMOTIDINE IN NACL 20-0.9 MG/50ML-% IV SOLN
INTRAVENOUS | Status: AC
  Filled 2017-11-10: qty 50

## 2017-11-10 MED ORDER — HEPARIN SOD (PORK) LOCK FLUSH 100 UNIT/ML IV SOLN
500.0000 [IU] | Freq: Once | INTRAVENOUS | Status: AC | PRN
  Administered 2017-11-10: 500 [IU]
  Filled 2017-11-10: qty 5

## 2017-11-10 MED ORDER — SODIUM CHLORIDE 0.9 % IV SOLN
20.0000 mg | Freq: Once | INTRAVENOUS | Status: AC
  Administered 2017-11-10: 20 mg via INTRAVENOUS
  Filled 2017-11-10: qty 2

## 2017-11-10 MED ORDER — SODIUM CHLORIDE 0.9 % IV SOLN: Freq: Once | INTRAVENOUS | Status: AC

## 2017-11-10 MED ORDER — DIPHENHYDRAMINE HCL 50 MG/ML IJ SOLN
INTRAMUSCULAR | Status: AC
  Filled 2017-11-10: qty 1

## 2017-11-10 MED ORDER — SODIUM CHLORIDE 0.9 % IV SOLN
Freq: Once | INTRAVENOUS | Status: AC
  Administered 2017-11-10: 14:00:00 via INTRAVENOUS

## 2017-11-10 MED ORDER — SODIUM CHLORIDE 0.9% FLUSH
10.0000 mL | INTRAVENOUS | Status: DC | PRN
  Administered 2017-11-10: 10 mL
  Filled 2017-11-10: qty 10

## 2017-11-10 MED ORDER — SODIUM CHLORIDE 0.9 % IV SOLN
45.0000 mg/m2 | Freq: Once | INTRAVENOUS | Status: AC
  Administered 2017-11-10: 72 mg via INTRAVENOUS
  Filled 2017-11-10: qty 12

## 2017-11-10 MED ORDER — DIPHENHYDRAMINE HCL 50 MG/ML IJ SOLN
50.0000 mg | Freq: Once | INTRAMUSCULAR | Status: AC
  Administered 2017-11-10: 50 mg via INTRAVENOUS

## 2017-11-10 NOTE — Patient Instructions (Signed)
[facility] Discharge Instructions for Patients Receiving Chemotherapy  Today you received the following chemotherapy agents:  Taxotere and Carboplatin  To help prevent nausea and vomiting after your treatment, we encourage you to take your nausea medication as ordered per MD.    If you develop nausea and vomiting that is not controlled by your nausea medication, call the clinic.   BELOW ARE SYMPTOMS THAT SHOULD BE REPORTED IMMEDIATELY:  *FEVER GREATER THAN 100.5 F  *CHILLS WITH OR WITHOUT FEVER  NAUSEA AND VOMITING THAT IS NOT CONTROLLED WITH YOUR NAUSEA MEDICATION  *UNUSUAL SHORTNESS OF BREATH  *UNUSUAL BRUISING OR BLEEDING  TENDERNESS IN MOUTH AND THROAT WITH OR WITHOUT PRESENCE OF ULCERS  *URINARY PROBLEMS  *BOWEL PROBLEMS  UNUSUAL RASH Items with * indicate a potential emergency and should be followed up as soon as possible.  Feel free to call the clinic should you have any questions or concerns. The clinic phone number is [phone].  Please show the Section at check-in to the Emergency Department and triage nurse.

## 2017-11-10 NOTE — Progress Notes (Signed)
Hematology and Oncology Follow Up Visit  Shannon Scott [id number] April 29, 1941 77 y.o. 11/10/2017   Principle Diagnosis:  Locally advanced/recurrent endometrial carcinoma undifferentiated  Current Therapy:   Radiation therapy for 5 -5 1/2 weeks - s/p 14 fractions Taxol/carboplatinumq 7 days -- s/p cycle #4 IV iron as indicated - last received 10/10/2017   Interim History: Ms. Novak is here today with her daughter for follow-up.  She feels a little bit down today.  She feels a little bit more tired.  She may be a little bit dehydrated.  We have given her iron.  Iron seems to have made her feel a little bit better.  Her pain has gotten much better.  There is been no bleeding from the colostomy.  She is still doing radiation treatments.  She has done nicely with radiation.  She has had no issues with cough or shortness of breath.  Her appetite is a little bit down also.  She may not have as much taste for food.  Overall, I said that her performance status is ECOG 2  Medications:  Allergies as of 11/10/2017      Reactions   Statins Other (See Comments)   Myalgias and memory problems   Ciprofloxacin Itching   Splotchy redness with itching during IV infusion localized to arm.      Medication List        Accurate as of 11/10/17  1:09 PM. Always use your most recent med list.          acetaminophen 500 MG tablet Commonly known as:  TYLENOL Take 500 mg by mouth every 6 (six) hours as needed for mild pain, moderate pain, fever or headache.   belladonna-PHENObarbital 16.2 MG/5ML Elix Commonly known as:  DONNATAL Take 5 mLs (16.2 mg total) by mouth 4 (four) times daily.   diclofenac sodium 1 % Gel Commonly known as:  VOLTAREN Apply 2 g topically 4 (four) times daily.   diltiazem 300 MG 24 hr capsule Commonly known as:  TIAZAC Take 300 mg by mouth daily.   dronabinol 2.5 MG capsule Commonly known as:  MARINOL Take 1 capsule (2.5 mg total) by mouth 2 (two)  times daily before lunch and supper.   lactose free nutrition Liqd Take 237 mLs by mouth 3 (three) times daily with meals.   levothyroxine 50 MCG tablet Commonly known as:  SYNTHROID, LEVOTHROID Take 1 tablet (50 mcg total) by mouth daily before breakfast.   lidocaine-prilocaine cream Commonly known as:  EMLA Apply 1 application topically as needed.   loperamide 2 MG capsule Commonly known as:  IMODIUM Take by mouth as needed for diarrhea or loose stools.   losartan 100 MG tablet Commonly known as:  COZAAR Take 100 mg by mouth daily.   mirtazapine 15 MG tablet Commonly known as:  REMERON Take 1 tablet (15 mg total) by mouth at bedtime.   ondansetron 8 MG tablet Commonly known as:  ZOFRAN Take 1 tablet (8 mg total) by mouth every 8 (eight) hours as needed for nausea or vomiting.   pantoprazole 40 MG tablet Commonly known as:  PROTONIX Take 1 tablet (40 mg total) by mouth daily.   polyethylene glycol packet Commonly known as:  MIRALAX / GLYCOLAX Take 17 g by mouth daily.   prochlorperazine 10 MG tablet Commonly known as:  COMPAZINE Take 1 tablet (10 mg total) by mouth every 6 (six) hours as needed for nausea or vomiting.   senna-docusate 8.6-50 MG tablet Commonly known as:  Senokot-S Take 1 tablet by mouth 2 (two) times daily.   traMADol 50 MG tablet Commonly known as:  ULTRAM Take 1 tablet (50 mg total) by mouth every 6 (six) hours as needed for moderate pain.       Allergies:  Allergies  Allergen Reactions  . Statins Other (See Comments)    Myalgias and memory problems  . Ciprofloxacin Itching    Splotchy redness with itching during IV infusion localized to arm.    Past Medical History, Surgical history, Social history, and Family History were reviewed and updated.  Review of Systems: Review of Systems  Constitutional: Positive for malaise/fatigue and weight loss.  HENT: Negative.   Eyes: Negative.   Respiratory: Positive for cough.     Cardiovascular: Negative.   Gastrointestinal: Positive for nausea.  Genitourinary: Positive for frequency.  Musculoskeletal: Positive for myalgias.  Skin: Negative.   Neurological: Negative.   Endo/Heme/Allergies: Negative.   Psychiatric/Behavioral: Negative.      Physical Exam:  weight is 136 lb (61.7 kg). Her oral temperature is 98.3 F (36.8 C). Her blood pressure is 139/47 (abnormal) and her pulse is 74. Her respiration is 18 and oxygen saturation is 98%.   Wt Readings from Last 3 Encounters:  11/10/17 136 lb (61.7 kg)  11/03/17 138 lb (62.6 kg)  10/27/17 138 lb (62.6 kg)    Physical Exam  Constitutional: She is oriented to person, place, and time.  HENT:  Head: Normocephalic and atraumatic.  Mouth/Throat: Oropharynx is clear and moist.  Eyes: Pupils are equal, round, and reactive to light. EOM are normal.  Neck: Normal range of motion.  Cardiovascular: Normal rate, regular rhythm and normal heart sounds.  Pulmonary/Chest: Effort normal and breath sounds normal.  Abdominal: Soft. Bowel sounds are normal.  Musculoskeletal: Normal range of motion. She exhibits no edema, tenderness or deformity.  Lymphadenopathy:    She has no cervical adenopathy.  Neurological: She is alert and oriented to person, place, and time.  Skin: Skin is warm and dry. No rash noted. No erythema.  Psychiatric: She has a normal mood and affect. Her behavior is normal. Judgment and thought content normal.  Vitals reviewed.    Lab Results  Component Value Date   WBC 5.7 11/10/2017   HGB 10.3 (L) 11/10/2017   HCT 30.9 (L) 11/10/2017   MCV 89.0 11/10/2017   PLT 194 11/10/2017   Lab Results  Component Value Date   FERRITIN 878 (H) 10/27/2017   IRON 37 (L) 10/27/2017   TIBC 209 (L) 10/27/2017   UIBC 172 10/27/2017   IRONPCTSAT 18 (L) 10/27/2017   Lab Results  Component Value Date   RETICCTPCT 0.5 (L) 10/20/2017   RBC 3.47 (L) 11/10/2017   No results found for: KPAFRELGTCHN, LAMBDASER,  KAPLAMBRATIO No results found for: IGGSERUM, IGA, IGMSERUM No results found for: Odetta Pink, SPEI   Chemistry      Component Value Date/Time   NA 137 11/10/2017 1156   NA 136 (A) 09/08/2017   K 3.7 11/10/2017 1156   CL 96 (L) 11/10/2017 1156   CO2 28 11/10/2017 1156   BUN 13 11/10/2017 1156   BUN 8 09/08/2017   CREATININE 0.80 11/10/2017 1156   CREATININE 0.69 08/02/2016 1519   GLU 111 09/08/2017      Component Value Date/Time   CALCIUM 9.4 11/10/2017 1156   ALKPHOS 75 11/10/2017 1156   AST 18 11/10/2017 1156   ALT 12 11/10/2017 1156   BILITOT 0.5  11/10/2017 1156      Impression and Plan: Ms. Finger is a very pleasant 77 yo caucasian female with recurrent undifferentiated endometrial carcinoma.   I think, from my perspective, that she is doing quite well.  She is doing somewhat better than when she was in the hospital.  For right now, we will proceed with treatment.  She has 1 more cycle to go.  We will also get a urine specimen on her.  She says that her urine is a little bit cloudy.  We will give her some extra IV fluid today.  I will also give her a dose of IV iron.  We will get her back in 1 week.  I told she and her daughter that we would not do any testing with scans probably for at least 6 weeks after completion of radiation.       Volanda Napoleon, MD 6/13/20191:09 PM

## 2017-11-10 NOTE — Patient Instructions (Signed)
Implanted Port Home Guide An implanted port is a type of central line that is placed under the skin. Central lines are used to provide IV access when treatment or nutrition needs to be given through a person's veins. Implanted ports are used for long-term IV access. An implanted port may be placed because:  You need IV medicine that would be irritating to the small veins in your hands or arms.  You need long-term IV medicines, such as antibiotics.  You need IV nutrition for a long period.  You need frequent blood draws for lab tests.  You need dialysis.  Implanted ports are usually placed in the chest area, but they can also be placed in the upper arm, the abdomen, or the leg. An implanted port has two main parts:  Reservoir. The reservoir is round and will appear as a small, raised area under your skin. The reservoir is the part where a needle is inserted to give medicines or draw blood.  Catheter. The catheter is a thin, flexible tube that extends from the reservoir. The catheter is placed into a large vein. Medicine that is inserted into the reservoir goes into the catheter and then into the vein.  How will I care for my incision site? Do not get the incision site wet. Bathe or shower as directed by your health care provider. How is my port accessed? Special steps must be taken to access the port:  Before the port is accessed, a numbing cream can be placed on the skin. This helps numb the skin over the port site.  Your health care provider uses a sterile technique to access the port. ? Your health care provider must put on a mask and sterile gloves. ? The skin over your port is cleaned carefully with an antiseptic and allowed to dry. ? The port is gently pinched between sterile gloves, and a needle is inserted into the port.  Only "non-coring" port needles should be used to access the port. Once the port is accessed, a blood return should be checked. This helps ensure that the port  is in the vein and is not clogged.  If your port needs to remain accessed for a constant infusion, a clear (transparent) bandage will be placed over the needle site. The bandage and needle will need to be changed every week, or as directed by your health care provider.  Keep the bandage covering the needle clean and dry. Do not get it wet. Follow your health care provider's instructions on how to take a shower or bath while the port is accessed.  If your port does not need to stay accessed, no bandage is needed over the port.  What is flushing? Flushing helps keep the port from getting clogged. Follow your health care provider's instructions on how and when to flush the port. Ports are usually flushed with saline solution or a medicine called heparin. The need for flushing will depend on how the port is used.  If the port is used for intermittent medicines or blood draws, the port will need to be flushed: ? After medicines have been given. ? After blood has been drawn. ? As part of routine maintenance.  If a constant infusion is running, the port may not need to be flushed.  How long will my port stay implanted? The port can stay in for as long as your health care provider thinks it is needed. When it is time for the port to come out, surgery will be   done to remove it. The procedure is similar to the one performed when the port was put in. When should I seek immediate medical care? When you have an implanted port, you should seek immediate medical care if:  You notice a bad smell coming from the incision site.  You have swelling, redness, or drainage at the incision site.  You have more swelling or pain at the port site or the surrounding area.  You have a fever that is not controlled with medicine.  This information is not intended to replace advice given to you by your health care provider. Make sure you discuss any questions you have with your health care provider. Document  Released: 05/17/2005 Document Revised: 10/23/2015 Document Reviewed: 01/22/2013 Elsevier Interactive Patient Education  2017 Elsevier Inc.  

## 2017-11-11 ENCOUNTER — Ambulatory Visit
Admission: RE | Admit: 2017-11-11 | Discharge: 2017-11-11 | Disposition: A | Discharge status: Home / Self Care | Payer: Medicare Other | Source: Ambulatory Visit | Attending: Radiation Oncology | Admitting: Radiation Oncology

## 2017-11-11 ENCOUNTER — Ambulatory Visit: Payer: Medicare Other

## 2017-11-11 ENCOUNTER — Telehealth: Payer: Self-pay | Admitting: *Deleted

## 2017-11-11 DIAGNOSIS — Z510 Encounter for antineoplastic radiation therapy: Secondary | ICD-10-CM | POA: Diagnosis not present

## 2017-11-11 DIAGNOSIS — C541 Malignant neoplasm of endometrium: Secondary | ICD-10-CM | POA: Diagnosis not present

## 2017-11-11 NOTE — Telephone Encounter (Signed)
FYI  Voicemail received to [facility] requesting nurse call with results.  "I will get the [facility] number another time.  I'm on my way to a urologist."

## 2017-11-12 LAB — URINE CULTURE: Culture: NO GROWTH

## 2017-11-14 ENCOUNTER — Ambulatory Visit
Admission: RE | Admit: 2017-11-14 | Discharge: 2017-11-14 | Disposition: A | Discharge status: Home / Self Care | Payer: Medicare Other | Source: Ambulatory Visit | Attending: Radiation Oncology | Admitting: Radiation Oncology

## 2017-11-14 ENCOUNTER — Ambulatory Visit: Payer: Medicare Other

## 2017-11-14 ENCOUNTER — Telehealth: Payer: Self-pay | Admitting: *Deleted

## 2017-11-14 DIAGNOSIS — Z510 Encounter for antineoplastic radiation therapy: Secondary | ICD-10-CM | POA: Diagnosis not present

## 2017-11-14 NOTE — Telephone Encounter (Addendum)
Patient is aware of results  ----- Message from Volanda Napoleon, MD sent at 11/14/2017  6:56 AM EDT ----- Call - urine culture is (-). pete

## 2017-11-15 ENCOUNTER — Ambulatory Visit: Payer: Medicare Other

## 2017-11-15 ENCOUNTER — Ambulatory Visit
Admission: RE | Admit: 2017-11-15 | Discharge: 2017-11-15 | Disposition: A | Discharge status: Home / Self Care | Payer: Medicare Other | Source: Ambulatory Visit | Attending: Radiation Oncology | Admitting: Radiation Oncology

## 2017-11-15 DIAGNOSIS — Z510 Encounter for antineoplastic radiation therapy: Secondary | ICD-10-CM | POA: Diagnosis not present

## 2017-11-16 ENCOUNTER — Ambulatory Visit
Admission: RE | Admit: 2017-11-16 | Discharge: 2017-11-16 | Disposition: A | Discharge status: Home / Self Care | Payer: Medicare Other | Source: Ambulatory Visit | Attending: Radiation Oncology | Admitting: Radiation Oncology

## 2017-11-16 ENCOUNTER — Ambulatory Visit: Payer: Medicare Other

## 2017-11-16 ENCOUNTER — Other Ambulatory Visit: Payer: Self-pay | Admitting: Urology

## 2017-11-16 DIAGNOSIS — Z510 Encounter for antineoplastic radiation therapy: Secondary | ICD-10-CM | POA: Diagnosis not present

## 2017-11-17 ENCOUNTER — Inpatient Hospital Stay: Payer: Medicare Other

## 2017-11-17 ENCOUNTER — Other Ambulatory Visit: Payer: Self-pay

## 2017-11-17 ENCOUNTER — Ambulatory Visit: Payer: Medicare Other

## 2017-11-17 ENCOUNTER — Ambulatory Visit
Admission: RE | Admit: 2017-11-17 | Discharge: 2017-11-17 | Disposition: A | Discharge status: Home / Self Care | Payer: Medicare Other | Source: Ambulatory Visit | Attending: Radiation Oncology | Admitting: Radiation Oncology

## 2017-11-17 ENCOUNTER — Inpatient Hospital Stay (HOSPITAL_BASED_OUTPATIENT_CLINIC_OR_DEPARTMENT_OTHER): Payer: Medicare Other | Admitting: Hematology & Oncology

## 2017-11-17 VITALS — BP 129/49 | HR 74 | Temp 98.1°F | Resp 20 | Wt 136.0 lb

## 2017-11-17 DIAGNOSIS — R197 Diarrhea, unspecified: Secondary | ICD-10-CM | POA: Diagnosis not present

## 2017-11-17 DIAGNOSIS — Z79899 Other long term (current) drug therapy: Secondary | ICD-10-CM

## 2017-11-17 DIAGNOSIS — Z510 Encounter for antineoplastic radiation therapy: Secondary | ICD-10-CM | POA: Diagnosis not present

## 2017-11-17 DIAGNOSIS — C541 Malignant neoplasm of endometrium: Secondary | ICD-10-CM | POA: Diagnosis not present

## 2017-11-17 DIAGNOSIS — Z923 Personal history of irradiation: Secondary | ICD-10-CM

## 2017-11-17 DIAGNOSIS — C799 Secondary malignant neoplasm of unspecified site: Principal | ICD-10-CM

## 2017-11-17 DIAGNOSIS — Z5111 Encounter for antineoplastic chemotherapy: Secondary | ICD-10-CM

## 2017-11-17 DIAGNOSIS — C539 Malignant neoplasm of cervix uteri, unspecified: Secondary | ICD-10-CM

## 2017-11-17 DIAGNOSIS — Z933 Colostomy status: Secondary | ICD-10-CM

## 2017-11-17 DIAGNOSIS — R300 Dysuria: Secondary | ICD-10-CM

## 2017-11-17 LAB — CBC WITH DIFFERENTIAL (CANCER CENTER ONLY)
Basophils Absolute: 0 10*3/uL (ref 0.0–0.1)
Basophils Relative: 1 %
Eosinophils Absolute: 0.2 10*3/uL (ref 0.0–0.5)
Eosinophils Relative: 6 %
HCT: 28.3 % — ABNORMAL LOW (ref 34.8–46.6)
Hemoglobin: 9.4 g/dL — ABNORMAL LOW (ref 11.6–15.9)
Lymphocytes Relative: 5 %
Lymphs Abs: 0.2 10*3/uL — ABNORMAL LOW (ref 0.9–3.3)
MCH: 29.7 pg (ref 26.0–34.0)
MCHC: 33.2 g/dL (ref 32.0–36.0)
MCV: 89.6 fL (ref 81.0–101.0)
Monocytes Absolute: 0.1 10*3/uL (ref 0.1–0.9)
Monocytes Relative: 4 %
Neutro Abs: 2.8 10*3/uL (ref 1.5–6.5)
Neutrophils Relative %: 84 %
Platelet Count: 167 10*3/uL (ref 145–400)
RBC: 3.16 MIL/uL — ABNORMAL LOW (ref 3.70–5.32)
RDW: 17.2 % — ABNORMAL HIGH (ref 11.1–15.7)
WBC Count: 3.3 10*3/uL — ABNORMAL LOW (ref 3.9–10.0)

## 2017-11-17 LAB — CMP (CANCER CENTER ONLY)
ALT: 21 U/L (ref 10–47)
AST: 22 U/L (ref 11–38)
Albumin: 3.4 g/dL — ABNORMAL LOW (ref 3.5–5.0)
Alkaline Phosphatase: 63 U/L (ref 26–84)
Anion gap: 9 (ref 5–15)
BUN: 11 mg/dL (ref 7–22)
CO2: 25 mmol/L (ref 18–33)
Calcium: 8.7 mg/dL (ref 8.0–10.3)
Chloride: 101 mmol/L (ref 98–108)
Creatinine: 0.9 mg/dL (ref 0.60–1.20)
Glucose, Bld: 92 mg/dL (ref 73–118)
Potassium: 4.1 mmol/L (ref 3.3–4.7)
Sodium: 135 mmol/L (ref 128–145)
Total Bilirubin: 0.6 mg/dL (ref 0.2–1.6)
Total Protein: 6.7 g/dL (ref 6.4–8.1)

## 2017-11-17 MED ORDER — FAMOTIDINE IN NACL 20-0.9 MG/50ML-% IV SOLN
20.0000 mg | Freq: Once | INTRAVENOUS | Status: AC
  Administered 2017-11-17: 20 mg via INTRAVENOUS

## 2017-11-17 MED ORDER — PALONOSETRON HCL INJECTION 0.25 MG/5ML
0.2500 mg | Freq: Once | INTRAVENOUS | Status: AC
  Administered 2017-11-17: 0.25 mg via INTRAVENOUS

## 2017-11-17 MED ORDER — SODIUM CHLORIDE 0.9 % IV SOLN
45.0000 mg/m2 | Freq: Once | INTRAVENOUS | Status: AC
  Administered 2017-11-17: 72 mg via INTRAVENOUS
  Filled 2017-11-17: qty 12

## 2017-11-17 MED ORDER — HEPARIN SOD (PORK) LOCK FLUSH 100 UNIT/ML IV SOLN
500.0000 [IU] | Freq: Once | INTRAVENOUS | Status: AC | PRN
  Administered 2017-11-17: 500 [IU]
  Filled 2017-11-17: qty 5

## 2017-11-17 MED ORDER — DIPHENHYDRAMINE HCL 50 MG/ML IJ SOLN
INTRAMUSCULAR | Status: AC
  Filled 2017-11-17: qty 1

## 2017-11-17 MED ORDER — FAMOTIDINE IN NACL 20-0.9 MG/50ML-% IV SOLN
INTRAVENOUS | Status: AC
Start: 2017-11-17 — End: ?
  Filled 2017-11-17: qty 50

## 2017-11-17 MED ORDER — SODIUM CHLORIDE 0.9 % IV SOLN
146.4000 mg | INTRAVENOUS | Status: DC
  Administered 2017-11-17: 150 mg via INTRAVENOUS
  Filled 2017-11-17: qty 15

## 2017-11-17 MED ORDER — PALONOSETRON HCL INJECTION 0.25 MG/5ML
INTRAVENOUS | Status: AC
  Filled 2017-11-17: qty 5

## 2017-11-17 MED ORDER — SODIUM CHLORIDE 0.9 % IV SOLN
20.0000 mg | Freq: Once | INTRAVENOUS | Status: AC
  Administered 2017-11-17: 20 mg via INTRAVENOUS
  Filled 2017-11-17: qty 2

## 2017-11-17 MED ORDER — DIPHENHYDRAMINE HCL 50 MG/ML IJ SOLN
50.0000 mg | Freq: Once | INTRAMUSCULAR | Status: AC
  Administered 2017-11-17: 50 mg via INTRAVENOUS

## 2017-11-17 MED ORDER — SODIUM CHLORIDE 0.9 % IV SOLN
1000.0000 mL | Freq: Once | INTRAVENOUS | Status: AC
  Administered 2017-11-17: 500 mL via INTRAVENOUS

## 2017-11-17 MED ORDER — SODIUM CHLORIDE 0.9 % IV SOLN
Freq: Once | INTRAVENOUS | Status: AC
  Administered 2017-11-17: 13:00:00 via INTRAVENOUS

## 2017-11-17 MED ORDER — SODIUM CHLORIDE 0.9% FLUSH
10.0000 mL | INTRAVENOUS | Status: DC | PRN
  Administered 2017-11-17: 10 mL
  Filled 2017-11-17: qty 10

## 2017-11-17 NOTE — Progress Notes (Signed)
Hematology and Oncology Follow Up Visit  Shannon Scott [id number] 27-May-1941 77 y.o. 11/17/2017   Principle Diagnosis:  Locally advanced/recurrent endometrial carcinoma undifferentiated  Current Therapy:   Radiation therapy for 5 -5 1/2 weeks - s/p 20 fractions Taxol/carboplatinumq 7 days -- s/p cycle #5  IV iron as indicated - last received 10/10/2017   Interim History: Shannon Scott is here today with her daughter for follow-up.  Thankfully, this will be her last cycle of chemotherapy.  She has done so well.  She is done better than I would have thought.  I am very happy for her.  She is having some diarrhea in the colostomy bag.  She is having no blood in the colostomy bag.  She has had a decent appetite.  She has had some nausea but no vomiting.  She is had no rashes.  She is had no ecchymoses.  There is been no mouth sores.  She is had no tingling in the hands or feet.  There is been no issues with pain.  Overall, since we first saw her in the hospital, she really has done incredibly well.    Overall, her performance status is ECOG 2.  Medications:  Allergies as of 11/17/2017      Reactions   Statins Other (See Comments)   Myalgias and memory problems   Ciprofloxacin Itching   Splotchy redness with itching during IV infusion localized to arm.      Medication List        Accurate as of 11/17/17  3:48 PM. Always use your most recent med list.          acetaminophen 500 MG tablet Commonly known as:  TYLENOL Take 500 mg by mouth every 6 (six) hours as needed for mild pain, moderate pain, fever or headache.   belladonna-PHENObarbital 16.2 MG/5ML Elix Commonly known as:  DONNATAL Take 5 mLs (16.2 mg total) by mouth 4 (four) times daily.   cephALEXin 500 MG capsule Commonly known as:  KEFLEX TK 1 C PO BID   diclofenac sodium 1 % Gel Commonly known as:  VOLTAREN Apply 2 g topically 4 (four) times daily.   diltiazem 300 MG 24 hr capsule Commonly known as:   TIAZAC Take 300 mg by mouth daily.   dronabinol 2.5 MG capsule Commonly known as:  MARINOL Take 1 capsule (2.5 mg total) by mouth 2 (two) times daily before lunch and supper.   lactose free nutrition Liqd Take 237 mLs by mouth 3 (three) times daily with meals.   levothyroxine 50 MCG tablet Commonly known as:  SYNTHROID, LEVOTHROID Take 1 tablet (50 mcg total) by mouth daily before breakfast.   lidocaine-prilocaine cream Commonly known as:  EMLA Apply 1 application topically as needed.   loperamide 2 MG capsule Commonly known as:  IMODIUM Take by mouth as needed for diarrhea or loose stools.   losartan 100 MG tablet Commonly known as:  COZAAR Take 100 mg by mouth daily.   mirtazapine 15 MG tablet Commonly known as:  REMERON Take 1 tablet (15 mg total) by mouth at bedtime.   ondansetron 8 MG tablet Commonly known as:  ZOFRAN Take 1 tablet (8 mg total) by mouth every 8 (eight) hours as needed for nausea or vomiting.   pantoprazole 40 MG tablet Commonly known as:  PROTONIX Take 1 tablet (40 mg total) by mouth daily.   polyethylene glycol packet Commonly known as:  MIRALAX / GLYCOLAX Take 17 g by mouth daily.   prochlorperazine  10 MG tablet Commonly known as:  COMPAZINE Take 1 tablet (10 mg total) by mouth every 6 (six) hours as needed for nausea or vomiting.   senna-docusate 8.6-50 MG tablet Commonly known as:  Senokot-S Take 1 tablet by mouth 2 (two) times daily.   traMADol 50 MG tablet Commonly known as:  ULTRAM Take 1 tablet (50 mg total) by mouth every 6 (six) hours as needed for moderate pain.       Allergies:  Allergies  Allergen Reactions  . Statins Other (See Comments)    Myalgias and memory problems  . Ciprofloxacin Itching    Splotchy redness with itching during IV infusion localized to arm.    Past Medical History, Surgical history, Social history, and Family History were reviewed and updated.  Review of Systems: Review of Systems    Constitutional: Positive for malaise/fatigue and weight loss.  HENT: Negative.   Eyes: Negative.   Respiratory: Positive for cough.   Cardiovascular: Negative.   Gastrointestinal: Positive for nausea.  Genitourinary: Positive for frequency.  Musculoskeletal: Positive for myalgias.  Skin: Negative.   Neurological: Negative.   Endo/Heme/Allergies: Negative.   Psychiatric/Behavioral: Negative.      Physical Exam:  weight is 136 lb (61.7 kg). Her oral temperature is 98.1 F (36.7 C). Her blood pressure is 129/49 (abnormal) and her pulse is 74. Her respiration is 20 and oxygen saturation is 100%.   Wt Readings from Last 3 Encounters:  11/17/17 136 lb (61.7 kg)  11/10/17 136 lb (61.7 kg)  11/03/17 138 lb (62.6 kg)    Physical Exam  Constitutional: She is oriented to person, place, and time.  HENT:  Head: Normocephalic and atraumatic.  Mouth/Throat: Oropharynx is clear and moist.  Eyes: Pupils are equal, round, and reactive to light. EOM are normal.  Neck: Normal range of motion.  Cardiovascular: Normal rate, regular rhythm and normal heart sounds.  Pulmonary/Chest: Effort normal and breath sounds normal.  Abdominal: Soft. Bowel sounds are normal.  Musculoskeletal: Normal range of motion. She exhibits no edema, tenderness or deformity.  Lymphadenopathy:    She has no cervical adenopathy.  Neurological: She is alert and oriented to person, place, and time.  Skin: Skin is warm and dry. No rash noted. No erythema.  Psychiatric: She has a normal mood and affect. Her behavior is normal. Judgment and thought content normal.  Vitals reviewed.    Lab Results  Component Value Date   WBC 3.3 (L) 11/17/2017   HGB 9.4 (L) 11/17/2017   HCT 28.3 (L) 11/17/2017   MCV 89.6 11/17/2017   PLT 167 11/17/2017   Lab Results  Component Value Date   FERRITIN 878 (H) 10/27/2017   IRON 37 (L) 10/27/2017   TIBC 209 (L) 10/27/2017   UIBC 172 10/27/2017   IRONPCTSAT 18 (L) 10/27/2017   Lab  Results  Component Value Date   RETICCTPCT 0.5 (L) 10/20/2017   RBC 3.16 (L) 11/17/2017   No results found for: KPAFRELGTCHN, LAMBDASER, KAPLAMBRATIO No results found for: IGGSERUM, IGA, IGMSERUM No results found for: Ronnald Ramp, A1GS, A2GS, Violet Baldy MSPIKE, SPEI   Chemistry      Component Value Date/Time   NA 135 11/17/2017 1150   NA 136 (A) 09/08/2017   K 4.1 11/17/2017 1150   CL 101 11/17/2017 1150   CO2 25 11/17/2017 1150   BUN 11 11/17/2017 1150   BUN 8 09/08/2017   CREATININE 0.90 11/17/2017 1150   CREATININE 0.69 08/02/2016 1519   GLU 111 09/08/2017  Component Value Date/Time   CALCIUM 8.7 11/17/2017 1150   ALKPHOS 63 11/17/2017 1150   AST 22 11/17/2017 1150   ALT 21 11/17/2017 1150   BILITOT 0.6 11/17/2017 1150      Impression and Plan: Shannon Scott is a very pleasant 77 yo caucasian female with recurrent undifferentiated endometrial carcinoma.   I think, from my perspective, that she is doing quite well.  She is doing somewhat better than when she was in the hospital.  We will go ahead and finish up her treatment today.  I probably would not do any scans for about 6-8 weeks.  I would like to wait to see how things settle down with her treatments.  I would like to see her back in 4 weeks.  Hopefully, she will be able to walk in.  I have to believe that she has responded incredibly well.  If so, then she can hopefully have surgery to resect out what ever residual malignancy is left behind.   Volanda Napoleon, MD 6/20/20193:48 PM

## 2017-11-17 NOTE — Patient Instructions (Signed)
[facility] Discharge Instructions for Patients Receiving Chemotherapy  Today you received the following chemotherapy agents:  Taxol and Carboplatin  To help prevent nausea and vomiting after your treatment, we encourage you to take your nausea medication as ordered per MD.   If you develop nausea and vomiting that is not controlled by your nausea medication, call the clinic.   BELOW ARE SYMPTOMS THAT SHOULD BE REPORTED IMMEDIATELY:  *FEVER GREATER THAN 100.5 F  *CHILLS WITH OR WITHOUT FEVER  NAUSEA AND VOMITING THAT IS NOT CONTROLLED WITH YOUR NAUSEA MEDICATION  *UNUSUAL SHORTNESS OF BREATH  *UNUSUAL BRUISING OR BLEEDING  TENDERNESS IN MOUTH AND THROAT WITH OR WITHOUT PRESENCE OF ULCERS  *URINARY PROBLEMS  *BOWEL PROBLEMS  UNUSUAL RASH Items with * indicate a potential emergency and should be followed up as soon as possible.  Feel free to call the clinic should you have any questions or concerns. The clinic phone number is [phone].  Please show the CHEMO ALERT CARD at check-in to the Emergency Department and triage nurse.   

## 2017-11-18 ENCOUNTER — Ambulatory Visit: Payer: Medicare Other

## 2017-11-18 ENCOUNTER — Ambulatory Visit
Admission: RE | Admit: 2017-11-18 | Discharge: 2017-11-18 | Disposition: A | Discharge status: Home / Self Care | Payer: Medicare Other | Source: Ambulatory Visit | Attending: Radiation Oncology | Admitting: Radiation Oncology

## 2017-11-18 DIAGNOSIS — Z510 Encounter for antineoplastic radiation therapy: Secondary | ICD-10-CM | POA: Diagnosis not present

## 2017-11-18 LAB — IRON AND TIBC
Iron: 60 ug/dL (ref 41–142)
Saturation Ratios: 31 % (ref 21–57)
TIBC: 195 ug/dL — ABNORMAL LOW (ref 236–444)
UIBC: 135 ug/dL

## 2017-11-18 LAB — CANCER ANTIGEN 19-9: CA 19-9: 242 U/mL — ABNORMAL HIGH (ref 0–35)

## 2017-11-18 LAB — CEA (IN HOUSE-CHCC): CEA (CHCC-In House): 27.96 ng/mL — ABNORMAL HIGH (ref 0.00–5.00)

## 2017-11-18 LAB — CA 125: Cancer Antigen (CA) 125: 53.6 U/mL — ABNORMAL HIGH (ref 0.0–38.1)

## 2017-11-18 LAB — FERRITIN: Ferritin: 1811 ng/mL — ABNORMAL HIGH (ref 9–269)

## 2017-11-21 ENCOUNTER — Ambulatory Visit: Payer: Medicare Other

## 2017-11-21 ENCOUNTER — Ambulatory Visit
Admission: RE | Admit: 2017-11-21 | Discharge: 2017-11-21 | Disposition: A | Discharge status: Home / Self Care | Payer: Medicare Other | Source: Ambulatory Visit | Attending: Radiation Oncology | Admitting: Radiation Oncology

## 2017-11-21 DIAGNOSIS — Z510 Encounter for antineoplastic radiation therapy: Secondary | ICD-10-CM | POA: Diagnosis not present

## 2017-11-22 ENCOUNTER — Encounter: Payer: Self-pay | Admitting: Radiation Oncology

## 2017-11-22 ENCOUNTER — Ambulatory Visit
Admission: RE | Admit: 2017-11-22 | Discharge: 2017-11-22 | Disposition: A | Discharge status: Home / Self Care | Payer: Medicare Other | Source: Ambulatory Visit | Attending: Radiation Oncology | Admitting: Radiation Oncology

## 2017-11-22 DIAGNOSIS — C539 Malignant neoplasm of cervix uteri, unspecified: Secondary | ICD-10-CM

## 2017-11-22 DIAGNOSIS — Z510 Encounter for antineoplastic radiation therapy: Secondary | ICD-10-CM | POA: Diagnosis not present

## 2017-11-22 DIAGNOSIS — C799 Secondary malignant neoplasm of unspecified site: Principal | ICD-10-CM

## 2017-11-22 LAB — BASIC METABOLIC PANEL - CANCER CENTER ONLY
Anion gap: 7 (ref 5–15)
BUN: 10 mg/dL (ref 8–23)
CO2: 26 mmol/L (ref 22–32)
Calcium: 9.1 mg/dL (ref 8.9–10.3)
Chloride: 97 mmol/L — ABNORMAL LOW (ref 98–111)
Creatinine: 0.75 mg/dL (ref 0.44–1.00)
GFR, Est AFR Am: >60 mL/min (ref >60)
GFR, Estimated: >60 mL/min (ref >60)
Glucose, Bld: 102 mg/dL — ABNORMAL HIGH (ref 70–99)
Potassium: 4 mmol/L (ref 3.5–5.1)
Sodium: 130 mmol/L — ABNORMAL LOW (ref 135–145)

## 2017-11-22 LAB — CBC WITH DIFFERENTIAL (CANCER CENTER ONLY)
Basophils Absolute: 0 10*3/uL (ref 0.0–0.1)
Basophils Relative: 0 %
Eosinophils Absolute: 0.1 10*3/uL (ref 0.0–0.5)
Eosinophils Relative: 5 %
HCT: 28.2 % — ABNORMAL LOW (ref 34.8–46.6)
Hemoglobin: 9.4 g/dL — ABNORMAL LOW (ref 11.6–15.9)
Lymphocytes Relative: 7 %
Lymphs Abs: 0.1 10*3/uL — ABNORMAL LOW (ref 0.9–3.3)
MCH: 29.5 pg (ref 25.1–34.0)
MCHC: 33.3 g/dL (ref 31.5–36.0)
MCV: 88.6 fL (ref 79.5–101.0)
Monocytes Absolute: 0.1 10*3/uL (ref 0.1–0.9)
Monocytes Relative: 6 %
Neutro Abs: 1.5 10*3/uL (ref 1.5–6.5)
Neutrophils Relative %: 82 %
Platelet Count: 204 10*3/uL (ref 145–400)
RBC: 3.18 MIL/uL — ABNORMAL LOW (ref 3.70–5.45)
RDW: 19.2 % — ABNORMAL HIGH (ref 11.2–14.5)
WBC Count: 1.9 10*3/uL — ABNORMAL LOW (ref 3.9–10.3)

## 2017-11-23 NOTE — Progress Notes (Signed)
°  Radiation Oncology         [phone] ________________________________  Name: Shannon Scott MRN: [id number]  Date: 11/22/2017  DOB: [birth year]  End of Treatment Note  Diagnosis:   77 y.o.female withLeft pelvic mass resulting in bowel obstruction and ureteral obstruction, poorly differentiated carcinoma consistent with gynecologic primary (recurrent endometrial cancer)     Indication for treatment:  Pain and local regional control       Radiation treatment dates:   10/12/2017 to 11/22/2017  Site/dose:   The pelvis was treated to 50.4 Gy in 28 fractions of 1.8 Gy along with radiosensitizing chemotherapy  Beams/energy:   3D // 10X,15X   Narrative: patient's pain in the left pelvis area improved overall. She did have some diarrhea with her treatments and significant fatigue at the completion of her therapy  Plan: The patient has completed radiation treatment.   The patient will return to radiation oncology clinic for routine followup in one month. I advised them to call or return sooner if they have any questions or concerns related to their recovery or treatment.  -----------------------------------  Blair Promise, PhD, MD  This document serves as a record of services personally performed by Gery Pray, MD. It was created on his behalf by Arlyce Harman, a trained medical scribe. The creation of this record is based on the scribe's personal observations and the provider's statements to them. This document has been checked and approved by the attending provider.

## 2017-11-25 ENCOUNTER — Telehealth: Payer: Self-pay

## 2017-11-25 ENCOUNTER — Other Ambulatory Visit: Payer: Self-pay | Admitting: Family

## 2017-11-25 DIAGNOSIS — C541 Malignant neoplasm of endometrium: Secondary | ICD-10-CM

## 2017-11-25 DIAGNOSIS — R531 Weakness: Secondary | ICD-10-CM

## 2017-11-25 NOTE — Telephone Encounter (Signed)
Received voicemail from Shannon Scott's son Shannon Scott requesting lab results from earlier this week. Shannon Scott also states that Shannon Scott is experiencing fatigue and has a "boil on her bottom."  Per Dr Marin Olp, called Shannon Scott back with results and instructed him that Shannon Scott does not need blood products or fluids.  Upon further questioning, Shannon Scott states he has not seen area on buttocks, but he believes it is skin breakdown "like she had when she was in the nursing facility." Questions if Dr Marin Olp can order "patches" that were used in SNF. Per Judson Roch, NP keep area clean and dry, then apply neosporin and Allevyn dressing that can be purchased over the counter. Aware to contact office/on call service if area is not what is expected upon assessment or if patient has a fever.  Shannon Scott also questions if a nurse referral can be made for home health. Shannon Scott currently is receiving services from [place]. Shannon Scott agrees with plan and will place referral. dph

## 2017-11-25 NOTE — Telephone Encounter (Signed)
Pt calling to request recent lab results that were collected on 11/22/17. Conveyed results of CBC and BMP. Pt states she had already received those results and that "there was another part of the labs that they said were take a lot longer so I didn't wait". Conveyed to pt that those were the only results from 11/22/17. Pt questioned who to call if she had a problem since "I have so many doctors". Conveyed to pt that Dr. Sondra Come was out of office but that she could contact Dr. Antonieta Pert office. Pt verbalized understanding. Loma Sousa, RN BSN

## 2017-12-02 NOTE — Patient Instructions (Signed)
RANEEN JAFFER  12/02/2017   Your procedure is scheduled on: 12-06-17   Report to [facility] Main  Entrance    Report to Admitting at 5:30 AM    Call this number if you have problems the morning of surgery [phone]   Remember: Do not eat food or drink liquids :After Midnight.     Take these medicines the morning of surgery with A SIP OF WATER: Diltiazem (Tiazac), Levothyroxine (Synthroid), and Pantoprazole (Protonix)                                You may not have any metal on your body including hair pins and              piercings  Do not wear jewelry, make-up, lotions, powders or perfumes, deodorant             Do not wear nail polish.  Do not shave  48 hours prior to surgery.               Do not bring valuables to the hospital. [place].  Contacts, dentures or bridgework may not be worn into surgery.  Leave suitcase in the car. After surgery it may be brought to your room.     Patients discharged the day of surgery will not be allowed to drive home.  Name and phone number of your driver:  Special Instructions: N/A              Please read over the following fact sheets you were given: _____________________________________________________________________             [facility] - Preparing for Surgery Before surgery, you can play an important role.  Because skin is not sterile, your skin needs to be as free of germs as possible.  You can reduce the number of germs on your skin by washing with CHG (chlorahexidine gluconate) soap before surgery.  CHG is an antiseptic cleaner which kills germs and bonds with the skin to continue killing germs even after washing. Please DO NOT use if you have an allergy to CHG or antibacterial soaps.  If your skin becomes reddened/irritated stop using the CHG and inform your nurse when you arrive at Short Stay. Do not shave (including legs and underarms) for at  least 48 hours prior to the first CHG shower.  You may shave your face/neck. Please follow these instructions carefully:  1.  Shower with CHG Soap the night before surgery and the  morning of Surgery.  2.  If you choose to wash your hair, wash your hair first as usual with your  normal  shampoo.  3.  After you shampoo, rinse your hair and body thoroughly to remove the  shampoo.                           4.  Use CHG as you would any other liquid soap.  You can apply chg directly  to the skin and wash                       Gently with a scrungie or clean washcloth.  5.  Apply the  CHG Soap to your body ONLY FROM THE NECK DOWN.   Do not use on face/ open                           Wound or open sores. Avoid contact with eyes, ears mouth and genitals (private parts).                       Wash face,  Genitals (private parts) with your normal soap.             6.  Wash thoroughly, paying special attention to the area where your surgery  will be performed.  7.  Thoroughly rinse your body with warm water from the neck down.  8.  DO NOT shower/wash with your normal soap after using and rinsing off  the CHG Soap.                9.  Pat yourself dry with a clean towel.            10.  Wear clean pajamas.            11.  Place clean sheets on your bed the night of your first shower and do not  sleep with pets. Day of Surgery : Do not apply any lotions/deodorants the morning of surgery.  Please wear clean clothes to the hospital/surgery center.  FAILURE TO FOLLOW THESE INSTRUCTIONS MAY RESULT IN THE CANCELLATION OF YOUR SURGERY PATIENT SIGNATURE_________________________________  NURSE SIGNATURE__________________________________  ________________________________________________________________________

## 2017-12-02 NOTE — Progress Notes (Signed)
10-03-17 (Epic) EKG 08-19-17 (Epic) ECHO

## 2017-12-05 ENCOUNTER — Other Ambulatory Visit: Payer: Self-pay

## 2017-12-05 ENCOUNTER — Inpatient Hospital Stay (HOSPITAL_COMMUNITY)
Admission: RE | Admit: 2017-12-05 | Discharge: 2017-12-05 | Disposition: A | Discharge status: Home / Self Care | Payer: Medicare Other | Source: Ambulatory Visit

## 2017-12-05 ENCOUNTER — Encounter (HOSPITAL_COMMUNITY): Payer: Self-pay | Admitting: *Deleted

## 2017-12-05 NOTE — H&P (Signed)
CC: I have hydronephrosis (Stent Inserted).  HPI: Shannon Scott is a 77 year-old female established patient who is here for hydronephrosis (Stent Inserted).    Shannon Scott returns today for a preop visit for replacement of a left ureteral stent that was inserted in March for an obstructing pelvic mass from endometrial carcinoma. She is currently under therapy by Dr. Marin Olp but had a large left pelvic mass still present on her last CT in May and she has subsequently received radiation therapy that was completed on 11/22/17. She was seen on 6/14 and had a UTI which has been treated and her UA is clear today. She has some dysuria and bladder pain but she has no other assoicated signs or symptoms.       ALLERGIES: Ciprofloxacin Statins     MEDICATIONS: Levothyroxine Sodium 50 mcg tablet  Metoprolol Succinate 50 mg tablet, extended release 24 hr  Advil  Aspirin Ec 81 mg tablet, delayed release  Docusate Sodium 100 mg capsule  Loratadine 10 mg tablet     GU PSH: Complex Uroflow - 06/02/2016 Cystoscopy Insert Stent, Left - 08/21/2017 Hysterectomy      PSH Notes: 05/12/16 she had aspiration with CT guidance of the left lymphocele.    NON-GU PSH: Bilateral Tubal Ligation Coronary Artery Bypass Grafting - about 2005 Heart Surgery (Unspecified)    GU PMH: Ureteral obstruction - 11/11/2017 Incomplete bladder emptying, Her voiding symptoms could be secondary to the lymphocele abutting the bladder. - 04/15/2016 Lower abdominal pain, unspecified, related to the lymphocele. 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  Does not drink anymore.  Drinks 2 caffeinated drinks per day.     Notes: 1 son, 1 daughter    REVIEW OF SYSTEMS:    GU Review Female:   Patient reports burning /pain with urination. Patient denies frequent urination, hard to postpone urination, get up at night to urinate, leakage of urine, stream starts and stops, trouble starting your stream, have to strain to urinate, and being pregnant.  Gastrointestinal (Upper):   Patient reports nausea. Patient denies vomiting and indigestion/ heartburn.  Gastrointestinal (Lower):   Patient reports diarrhea and constipation.   Constitutional:   Patient denies fever, night sweats, weight loss, and fatigue.  Skin:   Patient denies skin rash/ lesion and itching.  Eyes:   Patient denies blurred vision and double vision.  Ears/ Nose/ Throat:   Patient denies sore throat and sinus problems.  Hematologic/Lymphatic:   Patient reports easy bruising. Patient denies swollen glands.  Cardiovascular:   Patient denies leg swelling and chest pains.  Respiratory:   Patient denies cough and shortness of breath.  Endocrine:   Patient denies excessive thirst.  Musculoskeletal:   Patient reports back pain. Patient denies joint pain.  Neurological:   Patient denies headaches and dizziness.  Psychologic:   Patient denies depression and anxiety.   VITAL SIGNS:      11/30/2017 04:08 PM  Weight 135 lb / 61.23 kg  Height 61 in / 154.94 cm  Heart Rate 70 /min  Temperature 98.5 F / 36.9 C  BMI 25.5 kg/m   MULTI-SYSTEM PHYSICAL EXAMINATION:    Constitutional: Well-nourished. No physical deformities. Normally developed. Good grooming.  Respiratory: No labored breathing, no use of accessory muscles. Normal breath sounds.  Cardiovascular: Regular rate and rhythm. No murmur, no gallop.       PAST DATA REVIEWED:  Source Of History:  Patient  Records Review:   Previous Doctor Records  Urine Test Review:   Urinalysis, Urine Culture  X-Ray Review: C.T. Abdomen/Pelvis: Reviewed Films. Reviewed Report. Discussed With Patient. from 10/12/17. Left pelvic mass persists.   Notes:                     I have reviewed records and labs from Dr. Marin Olp and Dr. Sondra Come. Her Hgb was 9.4 on 11/22/17.    PROCEDURES:          Urinalysis Dipstick Dipstick Cont'd  Color: Yellow Bilirubin: Neg  Appearance: Clear Ketones: Neg  Specific Gravity: 1.010 Blood: Neg  pH: <=5.0 Protein: Trace  Glucose: Neg Urobilinogen: 0.2    Nitrites: Neg    Leukocyte Esterase: Neg    ASSESSMENT:      ICD-10 Details  1 GU:   Ureteral obstruction - N13.1 Left, Stable - She needs stent exchange. I have reviewed the risks of the procedure and will proceed in 12/06/17. Her UA is clear.   2   Endometrium Cancer - C54.1    PLAN:           Schedule Labs: 4 Months - Urinalysis  Return Visit/Planned Activity: 4 Months - Office Visit, Extender             Note: Please cancel f/u on 7/16. She could see Bree in f/u in 4 months.          Document Letter(s):  Created for Patient: Clinical Summary         Notes:   CC: Shannon Scott and Shannon Scott.         Next Appointment:      Next Appointment: 12/06/2017 07:30 AM    Appointment Type: Surgery     Location: [facility] [phone]    Provider: Irine Scott, M.D.    Reason for Visit: OP WL CYSTO LT RGP STENT EXCHANGE

## 2017-12-06 ENCOUNTER — Ambulatory Visit (HOSPITAL_COMMUNITY): Payer: Medicare Other

## 2017-12-06 ENCOUNTER — Ambulatory Visit (HOSPITAL_COMMUNITY): Payer: Medicare Other | Admitting: Anesthesiology

## 2017-12-06 ENCOUNTER — Ambulatory Visit (HOSPITAL_COMMUNITY)
Admission: RE | Admit: 2017-12-06 | Discharge: 2017-12-06 | Disposition: A | Discharge status: Home / Self Care | Payer: Medicare Other | Source: Ambulatory Visit | Attending: Urology | Admitting: Urology

## 2017-12-06 ENCOUNTER — Encounter (HOSPITAL_COMMUNITY)
Admission: RE | Disposition: A | Discharge status: Home / Self Care | Payer: Self-pay | Source: Ambulatory Visit | Attending: Urology

## 2017-12-06 ENCOUNTER — Encounter (HOSPITAL_COMMUNITY): Payer: Self-pay | Admitting: Anesthesiology

## 2017-12-06 DIAGNOSIS — I10 Essential (primary) hypertension: Secondary | ICD-10-CM | POA: Insufficient documentation

## 2017-12-06 DIAGNOSIS — I739 Peripheral vascular disease, unspecified: Secondary | ICD-10-CM | POA: Insufficient documentation

## 2017-12-06 DIAGNOSIS — Z791 Long term (current) use of non-steroidal anti-inflammatories (NSAID): Secondary | ICD-10-CM | POA: Diagnosis not present

## 2017-12-06 DIAGNOSIS — E039 Hypothyroidism, unspecified: Secondary | ICD-10-CM | POA: Diagnosis not present

## 2017-12-06 DIAGNOSIS — N1330 Unspecified hydronephrosis: Secondary | ICD-10-CM | POA: Diagnosis present

## 2017-12-06 DIAGNOSIS — R1900 Intra-abdominal and pelvic swelling, mass and lump, unspecified site: Secondary | ICD-10-CM | POA: Diagnosis not present

## 2017-12-06 DIAGNOSIS — N135 Crossing vessel and stricture of ureter without hydronephrosis: Secondary | ICD-10-CM | POA: Insufficient documentation

## 2017-12-06 DIAGNOSIS — Z923 Personal history of irradiation: Secondary | ICD-10-CM | POA: Insufficient documentation

## 2017-12-06 DIAGNOSIS — Z79890 Hormone replacement therapy (postmenopausal): Secondary | ICD-10-CM | POA: Insufficient documentation

## 2017-12-06 DIAGNOSIS — I2510 Atherosclerotic heart disease of native coronary artery without angina pectoris: Secondary | ICD-10-CM | POA: Insufficient documentation

## 2017-12-06 DIAGNOSIS — C541 Malignant neoplasm of endometrium: Secondary | ICD-10-CM | POA: Insufficient documentation

## 2017-12-06 DIAGNOSIS — I252 Old myocardial infarction: Secondary | ICD-10-CM | POA: Diagnosis not present

## 2017-12-06 DIAGNOSIS — Z79899 Other long term (current) drug therapy: Secondary | ICD-10-CM | POA: Diagnosis not present

## 2017-12-06 DIAGNOSIS — Z951 Presence of aortocoronary bypass graft: Secondary | ICD-10-CM | POA: Diagnosis not present

## 2017-12-06 DIAGNOSIS — Z7982 Long term (current) use of aspirin: Secondary | ICD-10-CM | POA: Diagnosis not present

## 2017-12-06 HISTORY — PX: CYSTOSCOPY W/ URETERAL STENT PLACEMENT: SHX1429

## 2017-12-06 LAB — BASIC METABOLIC PANEL
Anion gap: 9 (ref 5–15)
BUN: 10 mg/dL (ref 8–23)
CO2: 26 mmol/L (ref 22–32)
Calcium: 9.2 mg/dL (ref 8.9–10.3)
Chloride: 103 mmol/L (ref 98–111)
Creatinine, Ser: 0.65 mg/dL (ref 0.44–1.00)
GFR calc Af Amer: >60 mL/min (ref >60)
GFR calc non Af Amer: >60 mL/min (ref >60)
Glucose, Bld: 104 mg/dL — ABNORMAL HIGH (ref 70–99)
Potassium: 3.6 mmol/L (ref 3.5–5.1)
Sodium: 138 mmol/L (ref 135–145)

## 2017-12-06 LAB — CBC
HCT: 27.2 % — ABNORMAL LOW (ref 36.0–46.0)
Hemoglobin: 9.1 g/dL — ABNORMAL LOW (ref 12.0–15.0)
MCH: 30.6 pg (ref 26.0–34.0)
MCHC: 33.5 g/dL (ref 30.0–36.0)
MCV: 91.6 fL (ref 78.0–100.0)
Platelets: 230 10*3/uL (ref 150–400)
RBC: 2.97 MIL/uL — ABNORMAL LOW (ref 3.87–5.11)
RDW: 19.1 % — ABNORMAL HIGH (ref 11.5–15.5)
WBC: 3.7 10*3/uL — ABNORMAL LOW (ref 4.0–10.5)

## 2017-12-06 SURGERY — CYSTOSCOPY, WITH RETROGRADE PYELOGRAM AND URETERAL STENT INSERTION
Anesthesia: General | Laterality: Left

## 2017-12-06 MED ORDER — 0.9 % SODIUM CHLORIDE (POUR BTL) OPTIME
TOPICAL | Status: DC | PRN
  Administered 2017-12-06: 1000 mL

## 2017-12-06 MED ORDER — ACETAMINOPHEN 325 MG PO TABS: 650.0000 mg | ORAL_TABLET | ORAL | Status: DC | PRN

## 2017-12-06 MED ORDER — PROPOFOL 10 MG/ML IV BOLUS
INTRAVENOUS | Status: AC
  Filled 2017-12-06: qty 40

## 2017-12-06 MED ORDER — MORPHINE SULFATE (PF) 4 MG/ML IV SOLN: 1.0000 mg | INTRAVENOUS | Status: DC | PRN

## 2017-12-06 MED ORDER — SODIUM CHLORIDE 0.9% FLUSH: 3.0000 mL | Freq: Two times a day (BID) | INTRAVENOUS | Status: DC

## 2017-12-06 MED ORDER — LACTATED RINGERS IV SOLN
INTRAVENOUS | Status: DC
  Administered 2017-12-06: 07:00:00 via INTRAVENOUS

## 2017-12-06 MED ORDER — HYDROMORPHONE HCL 1 MG/ML IJ SOLN: 0.2500 mg | INTRAMUSCULAR | Status: DC | PRN

## 2017-12-06 MED ORDER — CEFAZOLIN SODIUM-DEXTROSE 2-4 GM/100ML-% IV SOLN
2.0000 g | INTRAVENOUS | Status: AC
  Administered 2017-12-06: 2 g via INTRAVENOUS
  Filled 2017-12-06: qty 100

## 2017-12-06 MED ORDER — PROPOFOL 10 MG/ML IV BOLUS
INTRAVENOUS | Status: DC | PRN
  Administered 2017-12-06: 150 mg via INTRAVENOUS

## 2017-12-06 MED ORDER — PROMETHAZINE HCL 25 MG/ML IJ SOLN: 6.2500 mg | INTRAMUSCULAR | Status: DC | PRN

## 2017-12-06 MED ORDER — ONDANSETRON HCL 4 MG/2ML IJ SOLN
INTRAMUSCULAR | Status: DC | PRN
  Administered 2017-12-06: 4 mg via INTRAVENOUS

## 2017-12-06 MED ORDER — LIDOCAINE HCL (CARDIAC) PF 100 MG/5ML IV SOSY
PREFILLED_SYRINGE | INTRAVENOUS | Status: DC | PRN
  Administered 2017-12-06: 60 mg via INTRAVENOUS

## 2017-12-06 MED ORDER — OXYCODONE HCL 5 MG PO TABS: 5.0000 mg | ORAL_TABLET | ORAL | Status: DC | PRN

## 2017-12-06 MED ORDER — OXYCODONE HCL 5 MG PO TABS: 5.0000 mg | ORAL_TABLET | Freq: Once | ORAL | Status: DC | PRN

## 2017-12-06 MED ORDER — OXYCODONE HCL 5 MG/5ML PO SOLN
5.0000 mg | Freq: Once | ORAL | Status: DC | PRN
  Filled 2017-12-06: qty 5

## 2017-12-06 MED ORDER — ACETAMINOPHEN 650 MG RE SUPP
650.0000 mg | RECTAL | Status: DC | PRN
  Filled 2017-12-06: qty 1

## 2017-12-06 MED ORDER — SODIUM CHLORIDE 0.9 % IV SOLN: 250.0000 mL | INTRAVENOUS | Status: DC | PRN

## 2017-12-06 MED ORDER — DEXAMETHASONE SODIUM PHOSPHATE 10 MG/ML IJ SOLN
INTRAMUSCULAR | Status: DC | PRN
  Administered 2017-12-06: 10 mg via INTRAVENOUS

## 2017-12-06 MED ORDER — FENTANYL CITRATE (PF) 100 MCG/2ML IJ SOLN
INTRAMUSCULAR | Status: AC
  Filled 2017-12-06: qty 2

## 2017-12-06 MED ORDER — SODIUM CHLORIDE 0.9% FLUSH: 3.0000 mL | INTRAVENOUS | Status: DC | PRN

## 2017-12-06 MED ORDER — SODIUM CHLORIDE 0.9 % IR SOLN
Status: DC | PRN
Start: 2017-12-06 — End: 2017-12-06
  Administered 2017-12-06: 3000 mL via INTRAVESICAL

## 2017-12-06 MED ORDER — FENTANYL CITRATE (PF) 100 MCG/2ML IJ SOLN
INTRAMUSCULAR | Status: DC | PRN
  Administered 2017-12-06: 50 ug via INTRAVENOUS
  Administered 2017-12-06 (×2): 25 ug via INTRAVENOUS

## 2017-12-06 SURGICAL SUPPLY — 12 items
BAG URO CATCHER STRL LF (MISCELLANEOUS) ×2 IMPLANT
CATH URET 5FR 28IN OPEN ENDED (CATHETERS) ×1 IMPLANT
CLOTH BEACON ORANGE TIMEOUT ST (SAFETY) ×2 IMPLANT
COVER FOOTSWITCH UNIV (MISCELLANEOUS) IMPLANT
COVER SURGICAL LIGHT HANDLE (MISCELLANEOUS) ×1 IMPLANT
GLOVE SURG SS PI 8.0 STRL IVOR (GLOVE) ×1 IMPLANT
GOWN STRL REUS W/TWL XL LVL3 (GOWN DISPOSABLE) ×2 IMPLANT
GUIDEWIRE STR DUAL SENSOR (WIRE) ×2 IMPLANT
MANIFOLD NEPTUNE II (INSTRUMENTS) ×2 IMPLANT
PACK CYSTO (CUSTOM PROCEDURE TRAY) ×2 IMPLANT
STENT URET 6FRX24 CONTOUR (STENTS) ×1 IMPLANT
TUBING CONNECTING 10 (TUBING) ×2 IMPLANT

## 2017-12-06 NOTE — Transfer of Care (Signed)
Immediate Anesthesia Transfer of Care Note  Patient: Shannon Scott  Procedure(s) Performed: CYSTOSCOPY WITH LEFT URETERAL STENT EXCHANGE (Left )  Patient Location: PACU  Anesthesia Type:General  Level of Consciousness: awake, alert  and oriented  Airway & Oxygen Therapy: Patient connected to face mask oxygen  Post-op Assessment: Report given to RN  Post vital signs: stable  Last Vitals:  Vitals Value Taken Time  BP 153/55 12/06/2017  8:09 AM  Temp    Pulse 78 12/06/2017  8:10 AM  Resp 18 12/06/2017  8:10 AM  SpO2 100 % 12/06/2017  8:10 AM  Vitals shown include unvalidated device data.  Last Pain:  Vitals:   12/06/17 0559  TempSrc:   PainSc: 0-No pain         Complications: No apparent anesthesia complications

## 2017-12-06 NOTE — Discharge Instructions (Signed)
Ureteral Stent Implantation, Care After Refer to this sheet in the next few weeks. These instructions provide you with information about caring for yourself after your procedure. Your health care provider may also give you more specific instructions. Your treatment has been planned according to current medical practices, but problems sometimes occur. Call your health care provider if you have any problems or questions after your procedure. What can I expect after the procedure? After the procedure, it is common to have:  Nausea.  Mild pain when you urinate. You may feel this pain in your lower back or lower abdomen. Pain should stop within a few minutes after you urinate. This may last for up to 1 week.  A small amount of blood in your urine for several days.  Follow these instructions at home:  Medicines  Take over-the-counter and prescription medicines only as told by your health care provider.  If you were prescribed an antibiotic medicine, take it as told by your health care provider. Do not stop taking the antibiotic even if you start to feel better.  Do not drive for 24 hours if you received a sedative.  Do not drive or operate heavy machinery while taking prescription pain medicines. Activity  Return to your normal activities as told by your health care provider. Ask your health care provider what activities are safe for you.  Do not lift anything that is heavier than 10 lb (4.5 kg). Follow this limit for 1 week after your procedure, or for as long as told by your health care provider. General instructions  Watch for any blood in your urine. Call your health care provider if the amount of blood in your urine increases.  If you have a catheter: ? Follow instructions from your health care provider about taking care of your catheter and collection bag. ? Do not take baths, swim, or use a hot tub until your health care provider approves.  Drink enough fluid to keep your urine  clear or pale yellow.  Keep all follow-up visits as told by your health care provider. This is important. Contact a health care provider if:  You have pain that gets worse or does not get better with medicine, especially pain when you urinate.  You have difficulty urinating.  You feel nauseous or you vomit repeatedly during a period of more than 2 days after the procedure. Get help right away if:  Your urine is dark red or has blood clots in it.  You are leaking urine (have incontinence).  The end of the stent comes out of your urethra.  You cannot urinate.  You have sudden, sharp, or severe pain in your abdomen or lower back.  You have a fever.  The stent was fairly clean upon removal and I think we can go six months before the next exchaged.   You currently have a f/u appointment scheduled for 7/16 but I am going to have the office change that and have you return in about 5 1/2 months to set you up for the next exchange.   This information is not intended to replace advice given to you by your health care provider. Make sure you discuss any questions you have with your health care provider. Document Released: 01/17/2013 Document Revised: 10/23/2015 Document Reviewed: 11/29/2014 Elsevier Interactive Patient Education  [employer].

## 2017-12-06 NOTE — Interval H&P Note (Signed)
History and Physical Interval Note:  12/06/2017 7:11 AM  Shannon Scott  has presented today for surgery, with the diagnosis of LEFT HYDRONEPHROSIS  The various methods of treatment have been discussed with the patient and family. After consideration of risks, benefits and other options for treatment, the patient has consented to  Procedure(s): CYSTOSCOPY WITH LEFT  RETROGRADE PYELOGRAM/ LEFT URETERAL STENT PLACEMENT (Left) as a surgical intervention .  The patient's history has been reviewed, patient examined, no change in status, stable for surgery.  I have reviewed the patient's chart and labs.  Questions were answered to the patient's satisfaction.     Irine Seal

## 2017-12-06 NOTE — Anesthesia Postprocedure Evaluation (Signed)
Anesthesia Post Note  Patient: Shannon Scott  Procedure(s) Performed: CYSTOSCOPY WITH LEFT URETERAL STENT EXCHANGE (Left )     Patient location during evaluation: PACU Anesthesia Type: General Level of consciousness: awake and alert Pain management: pain level controlled Vital Signs Assessment: post-procedure vital signs reviewed and stable Respiratory status: spontaneous breathing, nonlabored ventilation and respiratory function stable Cardiovascular status: blood pressure returned to baseline and stable Postop Assessment: no apparent nausea or vomiting Anesthetic complications: no    Last Vitals:  Vitals:   12/06/17 0845 12/06/17 0905  BP: 138/90 (!) 155/67  Pulse: 74 67  Resp: 18 18  Temp:  36.7 C  SpO2: 95% 100%    Last Pain:  Vitals:   12/06/17 0839  TempSrc:   PainSc: 0-No pain                 Lynda Rainwater

## 2017-12-06 NOTE — Anesthesia Procedure Notes (Signed)
Procedure Name: LMA Insertion Date/Time: 12/06/2017 7:55 AM Performed by: Lavina Hamman, CRNA Pre-anesthesia Checklist: Patient identified, Emergency Drugs available, Suction available and Patient being monitored Patient Re-evaluated:Patient Re-evaluated prior to induction Oxygen Delivery Method: Circle System Utilized Preoxygenation: Pre-oxygenation with 100% oxygen Induction Type: IV induction Ventilation: Mask ventilation without difficulty LMA: LMA inserted LMA Size: 4.0 Number of attempts: 1 Airway Equipment and Method: Bite block Placement Confirmation: positive ETCO2 Tube secured with: Tape Dental Injury: Teeth and Oropharynx as per pre-operative assessment

## 2017-12-06 NOTE — Anesthesia Preprocedure Evaluation (Signed)
Anesthesia Evaluation  Patient identified by MRN, date of birth, ID band Patient awake    Reviewed: Allergy & Precautions, H&P , NPO status , Patient's Chart, lab work & pertinent test results  History of Anesthesia Complications (+) PONV  Airway Mallampati: III  TM Distance: >3 FB Neck ROM: Full    Dental no notable dental hx. (+) Teeth Intact, Dental Advisory Given   Pulmonary asthma ,    Pulmonary exam normal breath sounds clear to auscultation       Cardiovascular hypertension, Pt. on medications + CAD, + Past MI and + Peripheral Vascular Disease  + dysrhythmias Atrial Fibrillation  Rhythm:Regular Rate:Normal + Systolic murmurs    Neuro/Psych negative neurological ROS  negative psych ROS   GI/Hepatic negative GI ROS, Neg liver ROS,   Endo/Other  Hypothyroidism   Renal/GU negative Renal ROS  negative genitourinary   Musculoskeletal  (+) Arthritis , Osteoarthritis,    Abdominal   Peds  Hematology negative hematology ROS (+) anemia ,   Anesthesia Other Findings   Reproductive/Obstetrics negative OB ROS                             Anesthesia Physical  Anesthesia Plan  ASA: III  Anesthesia Plan: General   Post-op Pain Management:    Induction: Intravenous  PONV Risk Score and Plan: 4 or greater and Ondansetron, Dexamethasone and Treatment may vary due to age or medical condition  Airway Management Planned: LMA  Additional Equipment:   Intra-op Plan:   Post-operative Plan: Extubation in OR  Informed Consent: I have reviewed the patients History and Physical, chart, labs and discussed the procedure including the risks, benefits and alternatives for the proposed anesthesia with the patient or authorized representative who has indicated his/her understanding and acceptance.   Dental advisory given  Plan Discussed with: CRNA  Anesthesia Plan Comments:          Anesthesia Quick Evaluation

## 2017-12-06 NOTE — Op Note (Signed)
Procedure: Cystoscopy with removal and replacement of left double-J stent.  Preop diagnosis: Left ureteral obstruction from pelvic mass.  Postop diagnosis: Same.  Surgeon: Dr. Irine Seal.  Anesthesia: General.  Drain: 6 French by 24 cm contour double-J stent.  Specimen: None.  EBL: None.  Complications: None.  Indications: Shannon Scott is a 77 year old white female with left ureteral obstruction from malignant left pelvic mass there is been managed with a ureteral stent.  She is due for stent exchange.  Procedure: She was taken operating room where she was given 2 g of Ancef.  A general anesthetic was induced.  She was placed in the lithotomy position and fitted with PAS hose.  Her perineum and genitalia were prepped with Betadine solution  and she was draped in usual sterile fashion.  Cystoscopy was performed using a 23 [place] scope and 30 degree lens.  Examination revealed a normal urethra.  The bladder wall had mild trabeculation with normal mucosa with the exception of some erythema edema around the left ureteral orifice where the stent loop rested.  The right ureteral orifice was unremarkable.  The stent loop was grasped with a grasping forceps and pulled to the urethral meatus.  A sensor guidewire was then passed to the kidney through the old stent which was then removed.  The cystoscope was reinserted over the wire and a 6 French 24 cm contour double-J stent was advanced to the kidney under fluoroscopic guidance.  The wire was removed leaving good coil in the kidney and a good coil in the bladder.  The bladder was drained and the cystoscope was removed.  She was taken down from lithotomy position, her anesthetic was reversed and she was moved to recovery in stable condition.  There were no complications

## 2017-12-07 ENCOUNTER — Encounter (HOSPITAL_COMMUNITY): Payer: Self-pay | Admitting: Urology

## 2017-12-16 ENCOUNTER — Encounter: Payer: Self-pay | Admitting: Hematology & Oncology

## 2017-12-16 ENCOUNTER — Inpatient Hospital Stay: Payer: Medicare Other

## 2017-12-16 ENCOUNTER — Other Ambulatory Visit: Payer: Self-pay | Admitting: *Deleted

## 2017-12-16 ENCOUNTER — Other Ambulatory Visit: Payer: Self-pay

## 2017-12-16 ENCOUNTER — Other Ambulatory Visit: Payer: Self-pay | Admitting: Family

## 2017-12-16 ENCOUNTER — Inpatient Hospital Stay: Payer: Medicare Other | Attending: Hematology & Oncology | Admitting: Hematology & Oncology

## 2017-12-16 VITALS — BP 135/57 | HR 72 | Temp 97.9°F | Wt 142.0 lb

## 2017-12-16 DIAGNOSIS — C541 Malignant neoplasm of endometrium: Secondary | ICD-10-CM

## 2017-12-16 DIAGNOSIS — C539 Malignant neoplasm of cervix uteri, unspecified: Secondary | ICD-10-CM

## 2017-12-16 DIAGNOSIS — D649 Anemia, unspecified: Secondary | ICD-10-CM

## 2017-12-16 DIAGNOSIS — Z923 Personal history of irradiation: Secondary | ICD-10-CM | POA: Diagnosis not present

## 2017-12-16 DIAGNOSIS — Z79899 Other long term (current) drug therapy: Secondary | ICD-10-CM | POA: Insufficient documentation

## 2017-12-16 DIAGNOSIS — C799 Secondary malignant neoplasm of unspecified site: Secondary | ICD-10-CM

## 2017-12-16 DIAGNOSIS — Z9221 Personal history of antineoplastic chemotherapy: Secondary | ICD-10-CM | POA: Diagnosis not present

## 2017-12-16 DIAGNOSIS — Z933 Colostomy status: Secondary | ICD-10-CM | POA: Insufficient documentation

## 2017-12-16 DIAGNOSIS — R5383 Other fatigue: Secondary | ICD-10-CM | POA: Diagnosis not present

## 2017-12-16 DIAGNOSIS — R531 Weakness: Secondary | ICD-10-CM | POA: Insufficient documentation

## 2017-12-16 DIAGNOSIS — D509 Iron deficiency anemia, unspecified: Secondary | ICD-10-CM | POA: Diagnosis not present

## 2017-12-16 LAB — CBC WITH DIFFERENTIAL (CANCER CENTER ONLY)
Basophils Absolute: 0 10*3/uL (ref 0.0–0.1)
Basophils Relative: 1 %
Eosinophils Absolute: 0.1 10*3/uL (ref 0.0–0.5)
Eosinophils Relative: 2 %
HCT: 27.9 % — ABNORMAL LOW (ref 34.8–46.6)
Hemoglobin: 9.1 g/dL — ABNORMAL LOW (ref 11.6–15.9)
Lymphocytes Relative: 6 %
Lymphs Abs: 0.3 10*3/uL — ABNORMAL LOW (ref 0.9–3.3)
MCH: 31.2 pg (ref 26.0–34.0)
MCHC: 32.6 g/dL (ref 32.0–36.0)
MCV: 95.5 fL (ref 81.0–101.0)
Monocytes Absolute: 0.4 10*3/uL (ref 0.1–0.9)
Monocytes Relative: 9 %
Neutro Abs: 3.8 10*3/uL (ref 1.5–6.5)
Neutrophils Relative %: 82 %
Platelet Count: 260 10*3/uL (ref 145–400)
RBC: 2.92 MIL/uL — ABNORMAL LOW (ref 3.70–5.32)
RDW: 18.6 % — ABNORMAL HIGH (ref 11.1–15.7)
WBC Count: 4.5 10*3/uL (ref 3.9–10.0)

## 2017-12-16 LAB — CMP (CANCER CENTER ONLY)
ALT: 14 U/L (ref 10–47)
AST: 22 U/L (ref 11–38)
Albumin: 3 g/dL — ABNORMAL LOW (ref 3.5–5.0)
Alkaline Phosphatase: 75 U/L (ref 26–84)
Anion gap: 9 (ref 5–15)
BUN: 8 mg/dL (ref 7–22)
CO2: 30 mmol/L (ref 18–33)
Calcium: 9.1 mg/dL (ref 8.0–10.3)
Chloride: 99 mmol/L (ref 98–108)
Creatinine: 0.7 mg/dL (ref 0.60–1.20)
Glucose, Bld: 127 mg/dL — ABNORMAL HIGH (ref 73–118)
Potassium: 3.9 mmol/L (ref 3.3–4.7)
Sodium: 138 mmol/L (ref 128–145)
Total Bilirubin: 0.4 mg/dL (ref 0.2–1.6)
Total Protein: 6.9 g/dL (ref 6.4–8.1)

## 2017-12-16 LAB — PREPARE RBC (CROSSMATCH)

## 2017-12-16 MED ORDER — SODIUM CHLORIDE 0.9% FLUSH
10.0000 mL | INTRAVENOUS | Status: DC | PRN
  Administered 2017-12-16: 10 mL via INTRAVENOUS
  Filled 2017-12-16: qty 10

## 2017-12-16 MED ORDER — HEPARIN SOD (PORK) LOCK FLUSH 100 UNIT/ML IV SOLN
500.0000 [IU] | Freq: Once | INTRAVENOUS | Status: AC
  Administered 2017-12-16: 500 [IU] via INTRAVENOUS
  Filled 2017-12-16: qty 5

## 2017-12-16 MED ORDER — PANCRELIPASE (LIP-PROT-AMYL) 36000-114000 UNITS PO CPEP: 72000.0000 [IU] | ORAL_CAPSULE | Freq: Three times a day (TID) | ORAL | 6 refills | Status: DC

## 2017-12-16 NOTE — Patient Instructions (Signed)
Implanted Port Insertion, Care After °This sheet gives you information about how to care for yourself after your procedure. Your health care provider may also give you more specific instructions. If you have problems or questions, contact your health care provider. °What can I expect after the procedure? °After your procedure, it is common to have: °· Discomfort at the port insertion site. °· Bruising on the skin over the port. This should improve over 3-4 days. ° °Follow these instructions at home: °Port care °· After your port is placed, you will get a manufacturer's information card. The card has information about your port. Keep this card with you at all times. °· Take care of the port as told by your health care provider. Ask your health care provider if you or a family member can get training for taking care of the port at home. A home health care nurse may also take care of the port. °· Make sure to remember what type of port you have. °Incision care °· Follow instructions from your health care provider about how to take care of your port insertion site. Make sure you: °? Wash your hands with soap and water before you change your bandage (dressing). If soap and water are not available, use hand sanitizer. °? Change your dressing as told by your health care provider. °? Leave stitches (sutures), skin glue, or adhesive strips in place. These skin closures may need to stay in place for 2 weeks or longer. If adhesive strip edges start to loosen and curl up, you may trim the loose edges. Do not remove adhesive strips completely unless your health care provider tells you to do that. °· Check your port insertion site every day for signs of infection. Check for: °? More redness, swelling, or pain. °? More fluid or blood. °? Warmth. °? Pus or a bad smell. °General instructions °· Do not take baths, swim, or use a hot tub until your health care provider approves. °· Do not lift anything that is heavier than 10 lb (4.5  kg) for a week, or as told by your health care provider. °· Ask your health care provider when it is okay to: °? Return to work or school. °? Resume usual physical activities or sports. °· Do not drive for 24 hours if you were given a medicine to help you relax (sedative). °· Take over-the-counter and prescription medicines only as told by your health care provider. °· Wear a medical alert bracelet in case of an emergency. This will tell any health care providers that you have a port. °· Keep all follow-up visits as told by your health care provider. This is important. °Contact a health care provider if: °· You cannot flush your port with saline as directed, or you cannot draw blood from the port. °· You have a fever or chills. °· You have more redness, swelling, or pain around your port insertion site. °· You have more fluid or blood coming from your port insertion site. °· Your port insertion site feels warm to the touch. °· You have pus or a bad smell coming from the port insertion site. °Get help right away if: °· You have chest pain or shortness of breath. °· You have bleeding from your port that you cannot control. °Summary °· Take care of the port as told by your health care provider. °· Change your dressing as told by your health care provider. °· Keep all follow-up visits as told by your health care provider. °  This information is not intended to replace advice given to you by your health care provider. Make sure you discuss any questions you have with your health care provider. °Document Released: 03/07/2013 Document Revised: 04/07/2016 Document Reviewed: 04/07/2016 °Elsevier Interactive Patient Education © 2017 [employer] ° °

## 2017-12-16 NOTE — Progress Notes (Signed)
Hematology and Oncology Follow Up Visit  Shannon Scott [id number] November 14, 1940 76 y.o. 12/16/2017   Principle Diagnosis:  Locally advanced/recurrent endometrial carcinoma undifferentiated  Current Therapy:   Radiation therapy for 5 -5 1/2 weeks - s/p 20 fractions Taxol/carboplatinumq 7 days -- s/p cycle #6 IV iron as indicated - last received 10/10/2017   Interim History: Shannon Scott is here today with her daughter and son for follow-up.  She still is not feeling all that well.  She does not have a lot of energy.  She is still anemic.  We have given her iron in the past.  Her last iron studies looked okay.  I really think that we need to transfuse her.  I think this will help with her performance status.  I think this will help with her digestion.  I will also give her some Creon.  I think that Creon will help quite a bit.  I talked to her about this.  We will try her on 72,000 units of Creon before meals.  She we will see radiation therapy next week for follow-up.  Her colostomy is working but is a little bit difficult for her.  Overall, her performance status is ECOG 2.  Medications:  Allergies as of 12/16/2017      Reactions   Statins Other (See Comments)   Myalgias and memory problems   Ciprofloxacin Itching   Splotchy redness with itching during IV infusion localized to arm.      Medication List        Accurate as of 12/16/17  2:28 PM. Always use your most recent med list.          acetaminophen 500 MG tablet Commonly known as:  TYLENOL Take 500 mg by mouth every 6 (six) hours as needed for mild pain, moderate pain, fever or headache.   belladonna-PHENObarbital 16.2 MG/5ML Elix Commonly known as:  DONNATAL Take 5 mLs (16.2 mg total) by mouth 4 (four) times daily.   diclofenac sodium 1 % Gel Commonly known as:  VOLTAREN Apply 2 g topically 4 (four) times daily.   diltiazem 300 MG 24 hr capsule Commonly known as:  TIAZAC Take 300 mg by mouth daily.     dronabinol 2.5 MG capsule Commonly known as:  MARINOL Take 1 capsule (2.5 mg total) by mouth 2 (two) times daily before lunch and supper.   lactose free nutrition Liqd Take 237 mLs by mouth 3 (three) times daily with meals.   levothyroxine 50 MCG tablet Commonly known as:  SYNTHROID, LEVOTHROID Take 1 tablet (50 mcg total) by mouth daily before breakfast.   lidocaine-prilocaine cream Commonly known as:  EMLA Apply 1 application topically as needed.   loperamide 2 MG capsule Commonly known as:  IMODIUM Take 2 mg by mouth as needed for diarrhea or loose stools.   losartan 100 MG tablet Commonly known as:  COZAAR Take 100 mg by mouth daily.   mirtazapine 15 MG tablet Commonly known as:  REMERON Take 1 tablet (15 mg total) by mouth at bedtime.   ondansetron 8 MG tablet Commonly known as:  ZOFRAN Take 1 tablet (8 mg total) by mouth every 8 (eight) hours as needed for nausea or vomiting.   pantoprazole 40 MG tablet Commonly known as:  PROTONIX Take 1 tablet (40 mg total) by mouth daily.   polyethylene glycol packet Commonly known as:  MIRALAX / GLYCOLAX Take 17 g by mouth daily.   prochlorperazine 10 MG tablet Commonly known as:  COMPAZINE  Take 1 tablet (10 mg total) by mouth every 6 (six) hours as needed for nausea or vomiting.   senna-docusate 8.6-50 MG tablet Commonly known as:  Senokot-S Take 1 tablet by mouth 2 (two) times daily.   traMADol 50 MG tablet Commonly known as:  ULTRAM Take 1 tablet (50 mg total) by mouth every 6 (six) hours as needed for moderate pain.       Allergies:  Allergies  Allergen Reactions  . Statins Other (See Comments)    Myalgias and memory problems  . Ciprofloxacin Itching    Splotchy redness with itching during IV infusion localized to arm.    Past Medical History, Surgical history, Social history, and Family History were reviewed and updated.  Review of Systems: Review of Systems  Constitutional: Positive for  malaise/fatigue and weight loss.  HENT: Negative.   Eyes: Negative.   Respiratory: Positive for cough.   Cardiovascular: Negative.   Gastrointestinal: Positive for nausea.  Genitourinary: Positive for frequency.  Musculoskeletal: Positive for myalgias.  Skin: Negative.   Neurological: Negative.   Endo/Heme/Allergies: Negative.   Psychiatric/Behavioral: Negative.      Physical Exam:  weight is 142 lb (64.4 kg). Her oral temperature is 97.9 F (36.6 C). Her blood pressure is 135/57 (abnormal) and her pulse is 72. Her oxygen saturation is 99%.   Wt Readings from Last 3 Encounters:  12/16/17 142 lb (64.4 kg)  12/06/17 140 lb 2 oz (63.6 kg)  11/17/17 136 lb (61.7 kg)    Physical Exam  Constitutional: She is oriented to person, place, and time.  HENT:  Head: Normocephalic and atraumatic.  Mouth/Throat: Oropharynx is clear and moist.  Eyes: Pupils are equal, round, and reactive to light. EOM are normal.  Neck: Normal range of motion.  Cardiovascular: Normal rate, regular rhythm and normal heart sounds.  Pulmonary/Chest: Effort normal and breath sounds normal.  Abdominal: Soft. Bowel sounds are normal.  Musculoskeletal: Normal range of motion. She exhibits no edema, tenderness or deformity.  Lymphadenopathy:    She has no cervical adenopathy.  Neurological: She is alert and oriented to person, place, and time.  Skin: Skin is warm and dry. No rash noted. No erythema.  Psychiatric: She has a normal mood and affect. Her behavior is normal. Judgment and thought content normal.  Vitals reviewed.    Lab Results  Component Value Date   WBC 4.5 12/16/2017   HGB 9.1 (L) 12/16/2017   HCT 27.9 (L) 12/16/2017   MCV 95.5 12/16/2017   PLT 260 12/16/2017   Lab Results  Component Value Date   FERRITIN 1,811 (H) 11/17/2017   IRON 60 11/17/2017   TIBC 195 (L) 11/17/2017   UIBC 135 11/17/2017   IRONPCTSAT 31 11/17/2017   Lab Results  Component Value Date   RETICCTPCT 0.5 (L)  10/20/2017   RBC 2.92 (L) 12/16/2017   No results found for: KPAFRELGTCHN, LAMBDASER, KAPLAMBRATIO No results found for: IGGSERUM, IGA, IGMSERUM No results found for: Ronnald Ramp, A1GS, A2GS, Tillman Sers, SPEI   Chemistry      Component Value Date/Time   NA 138 12/16/2017 1251   NA 136 (A) 09/08/2017   K 3.9 12/16/2017 1251   CL 99 12/16/2017 1251   CO2 30 12/16/2017 1251   BUN 8 12/16/2017 1251   BUN 8 09/08/2017   CREATININE 0.70 12/16/2017 1251   CREATININE 0.69 08/02/2016 1519   GLU 111 09/08/2017      Component Value Date/Time   CALCIUM 9.1 12/16/2017 1251  ALKPHOS 75 12/16/2017 1251   AST 22 12/16/2017 1251   ALT 14 12/16/2017 1251   BILITOT 0.4 12/16/2017 1251      Impression and Plan: Shannon Scott is a very pleasant 77 yo caucasian female with recurrent undifferentiated endometrial carcinoma.   We will really had to see how she is responded to treatment.  This will be done with a CT scan.  We will get this set up for about a month.  By then, it will be 2 months since she completed her radiation.  We will have her set up for the blood transfusion on Monday.  She will get 2 units of blood.  I will call in the Creon for her.  We will see how this can help.  Hopefully this will help with her digestion.  I will see her back a week after she has the CT scan.  This will tell us the kind of response that she has had.  Hopefully, we will see such a good response that surgery will not be all that extensive for resection of any residual disease.  I spent about 40 minutes with she and her family.  All the time was spent face-to-face.  I counseled them about the recommendations that I have and answered all their questions.   Volanda Napoleon, MD 7/19/20192:28 PM

## 2017-12-17 LAB — CA 125: Cancer Antigen (CA) 125: 15.8 U/mL (ref 0.0–38.1)

## 2017-12-17 LAB — ABO/RH: ABO/RH(D): O POS

## 2017-12-19 ENCOUNTER — Inpatient Hospital Stay: Payer: Medicare Other

## 2017-12-19 DIAGNOSIS — C541 Malignant neoplasm of endometrium: Secondary | ICD-10-CM

## 2017-12-19 DIAGNOSIS — D649 Anemia, unspecified: Secondary | ICD-10-CM

## 2017-12-19 LAB — IRON AND TIBC
Iron: 33 ug/dL — ABNORMAL LOW (ref 41–142)
Saturation Ratios: 17 % — ABNORMAL LOW (ref 21–57)
TIBC: 194 ug/dL — ABNORMAL LOW (ref 236–444)
UIBC: 160 ug/dL

## 2017-12-19 LAB — FERRITIN: Ferritin: 1310 ng/mL — ABNORMAL HIGH (ref 11–307)

## 2017-12-19 MED ORDER — DIPHENHYDRAMINE HCL 25 MG PO CAPS
25.0000 mg | ORAL_CAPSULE | Freq: Once | ORAL | Status: AC
  Administered 2017-12-19: 25 mg via ORAL

## 2017-12-19 MED ORDER — ACETAMINOPHEN 325 MG PO TABS
ORAL_TABLET | ORAL | Status: AC
  Filled 2017-12-19: qty 2

## 2017-12-19 MED ORDER — DIPHENHYDRAMINE HCL 25 MG PO CAPS
ORAL_CAPSULE | ORAL | Status: AC
  Filled 2017-12-19: qty 1

## 2017-12-19 MED ORDER — ACETAMINOPHEN 325 MG PO TABS
650.0000 mg | ORAL_TABLET | Freq: Once | ORAL | Status: AC
  Administered 2017-12-19: 650 mg via ORAL

## 2017-12-19 NOTE — Patient Instructions (Signed)

## 2017-12-20 ENCOUNTER — Encounter: Payer: Self-pay | Admitting: Hematology & Oncology

## 2017-12-20 ENCOUNTER — Other Ambulatory Visit: Payer: Self-pay | Admitting: *Deleted

## 2017-12-20 LAB — BPAM RBC
Blood Product Expiration Date: 201908122359
Blood Product Expiration Date: 201908122359
ISSUE DATE / TIME: 201907220804
ISSUE DATE / TIME: 201907220804
Unit Type and Rh: 5100
Unit Type and Rh: 5100

## 2017-12-20 LAB — TYPE AND SCREEN
ABO/RH(D): O POS
Antibody Screen: NEGATIVE
Unit division: 0
Unit division: 0

## 2017-12-20 MED ORDER — TRAMADOL HCL 50 MG PO TABS: 50.0000 mg | ORAL_TABLET | Freq: Four times a day (QID) | ORAL | 0 refills | Status: DC | PRN

## 2017-12-22 ENCOUNTER — Other Ambulatory Visit: Payer: Self-pay

## 2017-12-22 ENCOUNTER — Ambulatory Visit
Admission: RE | Admit: 2017-12-22 | Discharge: 2017-12-22 | Disposition: A | Discharge status: Home / Self Care | Payer: Medicare Other | Source: Ambulatory Visit | Attending: Radiation Oncology | Admitting: Radiation Oncology

## 2017-12-22 ENCOUNTER — Encounter: Payer: Self-pay | Admitting: Radiation Oncology

## 2017-12-22 VITALS — BP 145/47 | HR 67 | Temp 98.9°F | Resp 18 | Wt 140.2 lb

## 2017-12-22 DIAGNOSIS — Z933 Colostomy status: Secondary | ICD-10-CM | POA: Diagnosis not present

## 2017-12-22 DIAGNOSIS — Z79899 Other long term (current) drug therapy: Secondary | ICD-10-CM | POA: Insufficient documentation

## 2017-12-22 DIAGNOSIS — Z881 Allergy status to other antibiotic agents status: Secondary | ICD-10-CM | POA: Insufficient documentation

## 2017-12-22 DIAGNOSIS — Z888 Allergy status to other drugs, medicaments and biological substances status: Secondary | ICD-10-CM | POA: Diagnosis not present

## 2017-12-22 DIAGNOSIS — R1900 Intra-abdominal and pelvic swelling, mass and lump, unspecified site: Secondary | ICD-10-CM | POA: Diagnosis not present

## 2017-12-22 DIAGNOSIS — C541 Malignant neoplasm of endometrium: Secondary | ICD-10-CM | POA: Insufficient documentation

## 2017-12-22 DIAGNOSIS — M7989 Other specified soft tissue disorders: Secondary | ICD-10-CM | POA: Insufficient documentation

## 2017-12-22 DIAGNOSIS — K56609 Unspecified intestinal obstruction, unspecified as to partial versus complete obstruction: Secondary | ICD-10-CM | POA: Insufficient documentation

## 2017-12-22 NOTE — Progress Notes (Signed)
Radiation Oncology         [phone] ________________________________  Name: Shannon Scott MRN: [id number]  Date: 12/22/2017  DOB: [birth year]  Follow-Up Visit Note  CC: Leeroy Cha, MD  Volanda Napoleon, MD    ICD-10-CM   1. Endometrial cancer ([place]) C54.1     Diagnosis:   77 y.o.female withLeft pelvic mass resulting in bowel obstruction and ureteral obstruction, poorly differentiated carcinoma consistent with gynecologic primary(recurrent endometrial cancer)   Interval Since Last Radiation:  1 month   Radiation treatment dates:   10/12/2017 to 11/22/2017  Site/dose:   The pelvis was treated to 50.4 Gy in 28 fractions of 1.8 Gy along with radiosensitizing chemotherapy  Narrative:  The patient returns today for routine follow-up and is accompanied by family. She experiences  Mild nausea, fatigue, nocturia, lack of appetite and mild pain in her left hip. She occasionally notices mild swelling in her left leg, especially after eating a lot of salty foods. She has her colostomy bag and has not noticed any blood in it. She takes MiraLAX to keep her bowels regulated. The patient denies vaginal discharge, vaginal bleeding, hematuria, and dysuria.        She is scheduled for a CT abdomen pelvis with contrast on 01/18/2018.   ALLERGIES:  is allergic to statins and ciprofloxacin.  Meds: Current Outpatient Medications  Medication Sig Dispense Refill  . belladonna-PHENObarbital (DONNATAL) 16.2 MG/5ML ELIX Take 5 mLs (16.2 mg total) by mouth 4 (four) times daily. 600 mL 3  . diclofenac sodium (VOLTAREN) 1 % GEL Apply 2 g topically 4 (four) times daily.    Marland Kitchen diltiazem (TIAZAC) 300 MG 24 hr capsule Take 300 mg by mouth daily.    Marland Kitchen dronabinol (MARINOL) 2.5 MG capsule Take 1 capsule (2.5 mg total) by mouth 2 (two) times daily before lunch and supper. 60 capsule 0  . lactose free nutrition (BOOST PLUS) LIQD Take 237 mLs by mouth 3 (three) times daily with meals.  0  .  levothyroxine (SYNTHROID, LEVOTHROID) 50 MCG tablet Take 1 tablet (50 mcg total) by mouth daily before breakfast. 30 tablet 1  . lidocaine-prilocaine (EMLA) cream Apply 1 application topically as needed. 30 g 6  . lipase/protease/amylase (CREON) 36000 UNITS CPEP capsule Take 2 capsules (72,000 Units total) by mouth 3 (three) times daily before meals. 180 capsule 6  . loperamide (IMODIUM) 2 MG capsule Take 2 mg by mouth as needed for diarrhea or loose stools.     . ondansetron (ZOFRAN) 8 MG tablet Take 1 tablet (8 mg total) by mouth every 8 (eight) hours as needed for nausea or vomiting. 30 tablet 4  . pantoprazole (PROTONIX) 40 MG tablet Take 1 tablet (40 mg total) by mouth daily. 30 tablet 0  . polyethylene glycol (MIRALAX / GLYCOLAX) packet Take 17 g by mouth daily. 30 each 0  . prochlorperazine (COMPAZINE) 10 MG tablet Take 1 tablet (10 mg total) by mouth every 6 (six) hours as needed for nausea or vomiting. 30 tablet 3  . senna-docusate (SENOKOT-S) 8.6-50 MG tablet Take 1 tablet by mouth 2 (two) times daily.    . traMADol (ULTRAM) 50 MG tablet Take 1 tablet (50 mg total) by mouth every 6 (six) hours as needed for moderate pain. 120 tablet 0  . acetaminophen (TYLENOL) 500 MG tablet Take 500 mg by mouth every 6 (six) hours as needed for mild pain, moderate pain, fever or headache.    . losartan (COZAAR) 100 MG tablet Take 100  mg by mouth daily.    . mirtazapine (REMERON) 15 MG tablet Take 1 tablet (15 mg total) by mouth at bedtime. (Patient not taking: Reported on 12/22/2017) 30 tablet 0   No current facility-administered medications for this encounter.    Facility-Administered Medications Ordered in Other Encounters  Medication Dose Route Frequency Provider Last Rate Last Dose  . heparin lock flush 100 unit/mL  250 Units Intracatheter Once PRN [place], Holli Humbles, NP      . sodium chloride flush (NS) 0.9 % injection 10 mL  10 mL Intracatheter PRN Volanda Napoleon, MD   10 mL at 10/27/17 1502  .  sodium chloride flush (NS) 0.9 % injection 10 mL  10 mL Intracatheter PRN [place], Holli Humbles, NP        Physical Findings: The patient is in no acute distress. Patient is alert and oriented.  weight is 140 lb 3.2 oz (63.6 kg). Her temperature is 98.9 F (37.2 C). Her blood pressure is 145/47 (abnormal) and her pulse is 67. Her respiration is 18 and oxygen saturation is 98%. .   Lungs are clear to auscultation bilaterally. Heart has regular rate and rhythm. No palpable cervical, supraclavicular, or axillary adenopathy. Abdomen soft, non-tender, normal bowel sounds. Pt has colostomy bag in the abdominal area that is functioning well.   Lab Findings: Lab Results  Component Value Date   WBC 4.5 12/16/2017   HGB 9.1 (L) 12/16/2017   HCT 27.9 (L) 12/16/2017   MCV 95.5 12/16/2017   PLT 260 12/16/2017    Radiographic Findings: Dg C-arm 1-60 Min-no Report  Result Date: 12/06/2017 Fluoroscopy was utilized by the requesting physician.  No radiographic interpretation.    Impression:  The patient is clinically doing better at this time. With very little pain in the left pelvis region. Her energy has improved since earlier this week after receiving blood transfusions.   Plan:  PRN Radiation Oncology. She will meet with Dr. Marin Olp after her scans to decide if she would benefit from further chemotherapy.    ____________________________________  Blair Promise, PhD, MD  This document serves as a record of services personally performed by Blair Promise, PhD, MD. It was created on his behalf by Margit Banda, a trained medical scribe. The creation of this record is based on the scribe's personal observations and the provider's statements to them. This document has been checked and approved by the attending provider.

## 2017-12-22 NOTE — Progress Notes (Signed)
Shannon Scott is here for a follow-up appointment today.Patient states that the only pain she has is in her left hip. States that she has mild fatigue. Denies any vaginal discharge or bleeding. Denies any rectal bleeding. Patient has her colosty bag. States that she has a little nausea. States that she takes miralax to  Keep her bowels regulated.Reports noctuira x2. Denies any dysuria. Patients diastolic blood pressure was low denies any signs or symptoms of hypotension Vitals:   12/22/17 1602  BP: (!) 145/47  Pulse: 67  Resp: 18  Temp: 98.9 F (37.2 C)  SpO2: 98%  Weight: 140 lb 3.2 oz (63.6 kg)   Wt Readings from Last 3 Encounters:  12/22/17 140 lb 3.2 oz (63.6 kg)  12/16/17 142 lb (64.4 kg)  12/06/17 140 lb 2 oz (63.6 kg)

## 2018-01-18 ENCOUNTER — Encounter (HOSPITAL_COMMUNITY): Payer: Self-pay

## 2018-01-18 ENCOUNTER — Ambulatory Visit (HOSPITAL_COMMUNITY)
Admission: RE | Admit: 2018-01-18 | Discharge: 2018-01-18 | Disposition: A | Discharge status: Home / Self Care | Payer: Medicare Other | Source: Ambulatory Visit | Attending: Hematology & Oncology | Admitting: Hematology & Oncology

## 2018-01-18 DIAGNOSIS — C799 Secondary malignant neoplasm of unspecified site: Secondary | ICD-10-CM

## 2018-01-18 DIAGNOSIS — M799 Soft tissue disorder, unspecified: Secondary | ICD-10-CM | POA: Insufficient documentation

## 2018-01-18 DIAGNOSIS — C539 Malignant neoplasm of cervix uteri, unspecified: Secondary | ICD-10-CM | POA: Insufficient documentation

## 2018-01-18 DIAGNOSIS — Z90710 Acquired absence of both cervix and uterus: Secondary | ICD-10-CM | POA: Diagnosis not present

## 2018-01-18 DIAGNOSIS — Z933 Colostomy status: Secondary | ICD-10-CM | POA: Diagnosis not present

## 2018-01-18 MED ORDER — IOHEXOL 300 MG/ML  SOLN
100.0000 mL | Freq: Once | INTRAMUSCULAR | Status: AC | PRN
  Administered 2018-01-18: 100 mL via INTRAVENOUS

## 2018-01-19 ENCOUNTER — Other Ambulatory Visit: Payer: Self-pay | Admitting: Hematology & Oncology

## 2018-01-27 ENCOUNTER — Inpatient Hospital Stay: Payer: Medicare Other

## 2018-01-27 ENCOUNTER — Encounter: Payer: Self-pay | Admitting: Hematology & Oncology

## 2018-01-27 ENCOUNTER — Other Ambulatory Visit: Payer: Self-pay

## 2018-01-27 ENCOUNTER — Inpatient Hospital Stay: Payer: Medicare Other | Attending: Hematology & Oncology | Admitting: Hematology & Oncology

## 2018-01-27 VITALS — BP 179/58 | HR 73 | Temp 98.1°F | Wt 142.1 lb

## 2018-01-27 DIAGNOSIS — Z933 Colostomy status: Secondary | ICD-10-CM | POA: Insufficient documentation

## 2018-01-27 DIAGNOSIS — C541 Malignant neoplasm of endometrium: Secondary | ICD-10-CM | POA: Diagnosis not present

## 2018-01-27 DIAGNOSIS — C799 Secondary malignant neoplasm of unspecified site: Secondary | ICD-10-CM

## 2018-01-27 DIAGNOSIS — C539 Malignant neoplasm of cervix uteri, unspecified: Secondary | ICD-10-CM

## 2018-01-27 DIAGNOSIS — Z79899 Other long term (current) drug therapy: Secondary | ICD-10-CM | POA: Insufficient documentation

## 2018-01-27 DIAGNOSIS — Z923 Personal history of irradiation: Secondary | ICD-10-CM

## 2018-01-27 DIAGNOSIS — Z9221 Personal history of antineoplastic chemotherapy: Secondary | ICD-10-CM | POA: Insufficient documentation

## 2018-01-27 DIAGNOSIS — Z95828 Presence of other vascular implants and grafts: Secondary | ICD-10-CM

## 2018-01-27 DIAGNOSIS — D62 Acute posthemorrhagic anemia: Secondary | ICD-10-CM

## 2018-01-27 LAB — CMP (CANCER CENTER ONLY)
ALT: 14 U/L (ref 10–47)
AST: 24 U/L (ref 11–38)
Albumin: 3.5 g/dL (ref 3.5–5.0)
Alkaline Phosphatase: 87 U/L — ABNORMAL HIGH (ref 26–84)
Anion gap: 8 (ref 5–15)
BUN: 13 mg/dL (ref 7–22)
CO2: 32 mmol/L (ref 18–33)
Calcium: 10.3 mg/dL (ref 8.0–10.3)
Chloride: 98 mmol/L (ref 98–108)
Creatinine: 0.8 mg/dL (ref 0.60–1.20)
Glucose, Bld: 94 mg/dL (ref 73–118)
Potassium: 3.6 mmol/L (ref 3.3–4.7)
Sodium: 138 mmol/L (ref 128–145)
Total Bilirubin: 0.6 mg/dL (ref 0.2–1.6)
Total Protein: 7.4 g/dL (ref 6.4–8.1)

## 2018-01-27 LAB — CBC WITH DIFFERENTIAL (CANCER CENTER ONLY)
Basophils Absolute: 0 10*3/uL (ref 0.0–0.1)
Basophils Relative: 0 %
Eosinophils Absolute: 0.1 10*3/uL (ref 0.0–0.5)
Eosinophils Relative: 2 %
HCT: 36 % (ref 34.8–46.6)
Hemoglobin: 12.1 g/dL (ref 11.6–15.9)
Lymphocytes Relative: 7 %
Lymphs Abs: 0.3 10*3/uL — ABNORMAL LOW (ref 0.9–3.3)
MCH: 32.2 pg (ref 26.0–34.0)
MCHC: 33.6 g/dL (ref 32.0–36.0)
MCV: 95.7 fL (ref 81.0–101.0)
Monocytes Absolute: 0.3 10*3/uL (ref 0.1–0.9)
Monocytes Relative: 7 %
Neutro Abs: 4.1 10*3/uL (ref 1.5–6.5)
Neutrophils Relative %: 84 %
Platelet Count: 203 10*3/uL (ref 145–400)
RBC: 3.76 MIL/uL (ref 3.70–5.32)
RDW: 14.2 % (ref 11.1–15.7)
WBC Count: 4.8 10*3/uL (ref 3.9–10.0)

## 2018-01-27 LAB — LACTATE DEHYDROGENASE: LDH: 150 U/L (ref 98–192)

## 2018-01-27 MED ORDER — HEPARIN SOD (PORK) LOCK FLUSH 100 UNIT/ML IV SOLN
500.0000 [IU] | Freq: Once | INTRAVENOUS | Status: AC
  Administered 2018-01-27: 500 [IU] via INTRAVENOUS
  Filled 2018-01-27: qty 5

## 2018-01-27 MED ORDER — SODIUM CHLORIDE 0.9% FLUSH
10.0000 mL | INTRAVENOUS | Status: DC | PRN
  Administered 2018-01-27: 10 mL via INTRAVENOUS
  Filled 2018-01-27: qty 10

## 2018-01-27 NOTE — Patient Instructions (Signed)
Implanted Port Insertion, Care After °This sheet gives you information about how to care for yourself after your procedure. Your health care provider may also give you more specific instructions. If you have problems or questions, contact your health care provider. °What can I expect after the procedure? °After your procedure, it is common to have: °· Discomfort at the port insertion site. °· Bruising on the skin over the port. This should improve over 3-4 days. ° °Follow these instructions at home: °Port care °· After your port is placed, you will get a manufacturer's information card. The card has information about your port. Keep this card with you at all times. °· Take care of the port as told by your health care provider. Ask your health care provider if you or a family member can get training for taking care of the port at home. A home health care nurse may also take care of the port. °· Make sure to remember what type of port you have. °Incision care °· Follow instructions from your health care provider about how to take care of your port insertion site. Make sure you: °? Wash your hands with soap and water before you change your bandage (dressing). If soap and water are not available, use hand sanitizer. °? Change your dressing as told by your health care provider. °? Leave stitches (sutures), skin glue, or adhesive strips in place. These skin closures may need to stay in place for 2 weeks or longer. If adhesive strip edges start to loosen and curl up, you may trim the loose edges. Do not remove adhesive strips completely unless your health care provider tells you to do that. °· Check your port insertion site every day for signs of infection. Check for: °? More redness, swelling, or pain. °? More fluid or blood. °? Warmth. °? Pus or a bad smell. °General instructions °· Do not take baths, swim, or use a hot tub until your health care provider approves. °· Do not lift anything that is heavier than 10 lb (4.5  kg) for a week, or as told by your health care provider. °· Ask your health care provider when it is okay to: °? Return to work or school. °? Resume usual physical activities or sports. °· Do not drive for 24 hours if you were given a medicine to help you relax (sedative). °· Take over-the-counter and prescription medicines only as told by your health care provider. °· Wear a medical alert bracelet in case of an emergency. This will tell any health care providers that you have a port. °· Keep all follow-up visits as told by your health care provider. This is important. °Contact a health care provider if: °· You cannot flush your port with saline as directed, or you cannot draw blood from the port. °· You have a fever or chills. °· You have more redness, swelling, or pain around your port insertion site. °· You have more fluid or blood coming from your port insertion site. °· Your port insertion site feels warm to the touch. °· You have pus or a bad smell coming from the port insertion site. °Get help right away if: °· You have chest pain or shortness of breath. °· You have bleeding from your port that you cannot control. °Summary °· Take care of the port as told by your health care provider. °· Change your dressing as told by your health care provider. °· Keep all follow-up visits as told by your health care provider. °  This information is not intended to replace advice given to you by your health care provider. Make sure you discuss any questions you have with your health care provider. °Document Released: 03/07/2013 Document Revised: 04/07/2016 Document Reviewed: 04/07/2016 °Elsevier Interactive Patient Education © 2017 [employer] ° °

## 2018-01-27 NOTE — Progress Notes (Addendum)
Hematology and Oncology Follow Up Visit  Shannon Scott [id number] December 11, 1940 77 y.o. 01/27/2018   Principle Diagnosis:  Locally advanced/recurrent endometrial carcinoma undifferentiated  Current Therapy:   Radiation therapy for 5 -5 1/2 weeks - s/p 20 fractions Taxol/carboplatinumq 7 days -- s/p cycle #6 IV iron as indicated - last received 10/10/2017   Interim History: Shannon Scott is here today with her daughter for follow-up.  She really looks quite good.  I remember when she first came to the office, she came in a wheelchair.  Now she is walking in.  We did do a CT scan post treatment.  This was done on January 18, 2018.  It showed that she had a very nice response.  The left pelvic sidewall mass now measures 3.9 x 2.5 cm.  The previously measured 6.1 x 3.7 cm.  There is a 1.1 cm left internal iliac node.  Her CA-125 has come down to 15.8.  She really needs to have surgery.  I spoke to the surgeons.  They feel that this is more gynecologic surgery.  I will have to get with her original gynecologic surgeon.  Shannon Scott did her initial surgery back in 2017.  Shannon Scott really feels well.  She has had no problems with her colostomy.  Hopefully, the colostomy can be reversed when she has surgery.  She is had no cough or shortness of breath.  There is no nausea or vomiting.  She is had no rashes.  She is had no leg swelling.  Para she has had iron deficiency in the past.  We have given her IV iron.  She has responded nicely to this.  She is taking Creon before meals.  This is helped.  Overall, her performance status now is ECOG 1.  Medications:  Allergies as of 01/27/2018      Reactions   Statins Other (See Comments)   Myalgias and memory problems   Ciprofloxacin Itching   Splotchy redness with itching during IV infusion localized to arm.      Medication List        Accurate as of 01/27/18  1:11 PM. Always use your most recent med list.          acetaminophen 500  MG tablet Commonly known as:  TYLENOL Take 500 mg by mouth every 6 (six) hours as needed for mild pain, moderate pain, fever or headache.   belladonna-PHENObarbital 16.2 MG/5ML Elix Commonly known as:  DONNATAL Take 5 mLs (16.2 mg total) by mouth 4 (four) times daily.   diclofenac sodium 1 % Gel Commonly known as:  VOLTAREN Apply 2 g topically 4 (four) times daily.   diltiazem 300 MG 24 hr capsule Commonly known as:  TIAZAC Take 300 mg by mouth daily.   dronabinol 2.5 MG capsule Commonly known as:  MARINOL TAKE 1 CAPSULE BY MOUTH TWICE DAILY BEFORE LUNCH AND SUPPER   lactose free nutrition Liqd Take 237 mLs by mouth 3 (three) times daily with meals.   levothyroxine 50 MCG tablet Commonly known as:  SYNTHROID, LEVOTHROID Take 1 tablet (50 mcg total) by mouth daily before breakfast.   lidocaine-prilocaine cream Commonly known as:  EMLA Apply 1 application topically as needed.   lipase/protease/amylase 36000 UNITS Cpep capsule Commonly known as:  CREON Take 2 capsules (72,000 Units total) by mouth 3 (three) times daily before meals.   loperamide 2 MG capsule Commonly known as:  IMODIUM Take 2 mg by mouth as needed for diarrhea or loose stools.  losartan 100 MG tablet Commonly known as:  COZAAR Take 100 mg by mouth daily.   mirtazapine 15 MG tablet Commonly known as:  REMERON Take 1 tablet (15 mg total) by mouth at bedtime.   ondansetron 8 MG tablet Commonly known as:  ZOFRAN Take 1 tablet (8 mg total) by mouth every 8 (eight) hours as needed for nausea or vomiting.   pantoprazole 40 MG tablet Commonly known as:  PROTONIX Take 1 tablet (40 mg total) by mouth daily.   polyethylene glycol packet Commonly known as:  MIRALAX / GLYCOLAX Take 17 g by mouth daily.   prochlorperazine 10 MG tablet Commonly known as:  COMPAZINE Take 1 tablet (10 mg total) by mouth every 6 (six) hours as needed for nausea or vomiting.   senna-docusate 8.6-50 MG tablet Commonly known  as:  Senokot-S Take 1 tablet by mouth 2 (two) times daily.   traMADol 50 MG tablet Commonly known as:  ULTRAM Take 1 tablet (50 mg total) by mouth every 6 (six) hours as needed for moderate pain.       Allergies:  Allergies  Allergen Reactions  . Statins Other (See Comments)    Myalgias and memory problems  . Ciprofloxacin Itching    Splotchy redness with itching during IV infusion localized to arm.    Past Medical History, Surgical history, Social history, and Family History were reviewed and updated.  Review of Systems: Review of Systems  Constitutional: Positive for malaise/fatigue and weight loss.  HENT: Negative.   Eyes: Negative.   Respiratory: Positive for cough.   Cardiovascular: Negative.   Gastrointestinal: Positive for nausea.  Genitourinary: Positive for frequency.  Musculoskeletal: Positive for myalgias.  Skin: Negative.   Neurological: Negative.   Endo/Heme/Allergies: Negative.   Psychiatric/Behavioral: Negative.      Physical Exam:  weight is 142 lb 1.6 oz (64.5 kg). Her oral temperature is 98.1 F (36.7 C). Her blood pressure is 179/58 (abnormal) and her pulse is 73. Her oxygen saturation is 99%.   Wt Readings from Last 3 Encounters:  01/27/18 142 lb 1.6 oz (64.5 kg)  12/22/17 140 lb 3.2 oz (63.6 kg)  12/16/17 142 lb (64.4 kg)    Physical Exam  Constitutional: She is oriented to person, place, and time.  HENT:  Head: Normocephalic and atraumatic.  Mouth/Throat: Oropharynx is clear and moist.  Eyes: Pupils are equal, round, and reactive to light. EOM are normal.  Neck: Normal range of motion.  Cardiovascular: Normal rate, regular rhythm and normal heart sounds.  Pulmonary/Chest: Effort normal and breath sounds normal.  Abdominal: Soft. Bowel sounds are normal.  Musculoskeletal: Normal range of motion. She exhibits no edema, tenderness or deformity.  Lymphadenopathy:    She has no cervical adenopathy.  Neurological: She is alert and oriented  to person, place, and time.  Skin: Skin is warm and dry. No rash noted. No erythema.  Psychiatric: She has a normal mood and affect. Her behavior is normal. Judgment and thought content normal.  Vitals reviewed.    Lab Results  Component Value Date   WBC 4.8 01/27/2018   HGB 12.1 01/27/2018   HCT 36.0 01/27/2018   MCV 95.7 01/27/2018   PLT 203 01/27/2018   Lab Results  Component Value Date   FERRITIN 1,310 (H) 12/16/2017   IRON 33 (L) 12/16/2017   TIBC 194 (L) 12/16/2017   UIBC 160 12/16/2017   IRONPCTSAT 17 (L) 12/16/2017   Lab Results  Component Value Date   RETICCTPCT 0.5 (L) 10/20/2017  RBC 3.76 01/27/2018   No results found for: KPAFRELGTCHN, LAMBDASER, KAPLAMBRATIO No results found for: IGGSERUM, IGA, IGMSERUM No results found for: Odetta Pink, SPEI   Chemistry      Component Value Date/Time   NA 138 01/27/2018 1120   NA 136 (A) 09/08/2017   K 3.6 01/27/2018 1120   CL 98 01/27/2018 1120   CO2 32 01/27/2018 1120   BUN 13 01/27/2018 1120   BUN 8 09/08/2017   CREATININE 0.80 01/27/2018 1120   CREATININE 0.69 08/02/2016 1519   GLU 111 09/08/2017      Component Value Date/Time   CALCIUM 10.3 01/27/2018 1120   ALKPHOS 87 (H) 01/27/2018 1120   AST 24 01/27/2018 1120   ALT 14 01/27/2018 1120   BILITOT 0.6 01/27/2018 1120      Impression and Plan: Ms. Traynor is a very pleasant 77 yo caucasian female with recurrent undifferentiated endometrial carcinoma.   Again, she is responded very well to treatment.  As such, it is now time for surgery.  I cannot imagine why she would not have surgery.  I am just so happy that her quality of life is doing better.  We we will plan to have her come back in 6 weeks.  I am sure that I will see her in the hospital when she has surgery.  Whether or not she needs additional treatment will really depend on what is done at the time of surgery and any residual disease that  is left behind.  I spent about 35 minutes with she and her daughter.  All the time was spent face-to-face counseling them.  I reviewed all of her scans and labs.  I answered all their questions.    Volanda Napoleon, MD 8/30/20191:11 PM   ADDENDUM: I did speak with Dr. Gerarda Fraction of gynecologic oncology.  She said that there is no indication for surgical resection of any residual mass with recurrent endometrial cancer.  She will still see Ms. Dam and talk to her about this.  We will clearly need to make sure she gets on a surveillance protocol.  [name], MD

## 2018-01-31 ENCOUNTER — Telehealth: Payer: Self-pay | Admitting: Oncology

## 2018-01-31 LAB — IRON AND TIBC
Iron: 67 ug/dL (ref 41–142)
Saturation Ratios: 33 % (ref 21–57)
TIBC: 201 ug/dL — ABNORMAL LOW (ref 236–444)
UIBC: 134 ug/dL

## 2018-01-31 LAB — FERRITIN: Ferritin: 722 ng/mL — ABNORMAL HIGH (ref 11–307)

## 2018-01-31 NOTE — Telephone Encounter (Signed)
Left a message for Shannon Scott notifying her that an appointment has been scheduled with Dr. Skeet Latch on 02/16/18 at 2:30 pm.

## 2018-02-03 ENCOUNTER — Other Ambulatory Visit: Payer: Self-pay | Admitting: *Deleted

## 2018-02-03 ENCOUNTER — Telehealth: Payer: Self-pay | Admitting: *Deleted

## 2018-02-03 MED ORDER — AMOXICILLIN 500 MG PO TABS: ORAL_TABLET | ORAL | 0 refills | Status: DC

## 2018-02-03 NOTE — Telephone Encounter (Signed)
Call placed back to patient and patient notified per order of Dr. Marin Olp that it is ok to have her teeth cleaned and that Amoxicillin prescription will be sent to her pharmacy.  Patient appreciative of call back and has no questions at this time.

## 2018-02-03 NOTE — Telephone Encounter (Signed)
Call received from patient inquiring if it is ok for her to get her teeth cleaned next week and requesting an antibiotic prior to cleaning.  Instructed pt that I would speak with Dr. Marin Olp and call her back with orders.

## 2018-02-15 ENCOUNTER — Telehealth: Payer: Self-pay | Admitting: Oncology

## 2018-02-15 NOTE — Telephone Encounter (Signed)
Called Shannon Scott about her appointment with Dr. Skeet Latch tomorrow at 2:30.  She said she is aware of the appointment.

## 2018-02-16 ENCOUNTER — Inpatient Hospital Stay: Payer: Medicare Other | Attending: Hematology & Oncology | Admitting: Gynecologic Oncology

## 2018-02-16 ENCOUNTER — Encounter: Payer: Self-pay | Admitting: Gynecologic Oncology

## 2018-02-16 ENCOUNTER — Other Ambulatory Visit: Payer: Self-pay | Admitting: *Deleted

## 2018-02-16 VITALS — BP 156/57 | HR 74 | Temp 97.9°F | Resp 18 | Ht 61.0 in | Wt 143.0 lb

## 2018-02-16 DIAGNOSIS — Z923 Personal history of irradiation: Secondary | ICD-10-CM | POA: Diagnosis not present

## 2018-02-16 DIAGNOSIS — C541 Malignant neoplasm of endometrium: Secondary | ICD-10-CM | POA: Diagnosis not present

## 2018-02-16 DIAGNOSIS — Z90722 Acquired absence of ovaries, bilateral: Secondary | ICD-10-CM

## 2018-02-16 DIAGNOSIS — Z90710 Acquired absence of both cervix and uterus: Secondary | ICD-10-CM

## 2018-02-16 DIAGNOSIS — Z9221 Personal history of antineoplastic chemotherapy: Secondary | ICD-10-CM | POA: Diagnosis not present

## 2018-02-16 MED ORDER — TRAMADOL HCL 50 MG PO TABS: 50.0000 mg | ORAL_TABLET | Freq: Four times a day (QID) | ORAL | 0 refills | Status: DC | PRN

## 2018-02-16 NOTE — Progress Notes (Signed)
GYN ONCOLOGY OFFICE VISIT    Shannon Scott 77 y.o. female  CC:  Chief Complaint  Patient presents with  . Endometrial ca [facility])    Assessment/Plan:  Shannon Scott  is a 77 y.o.  year old with Stage IA grade 2 endometrioid adenocarcinoma.  Status post surgical staging  12/30/2015. She only had 30% depth of myometrial invasion, no lymphovascular space involvement, and low risk factors for recurrence therefore adjuvant therapy not recommended in accordance to NCCN guidelines.   In March 2019 she presented with a bowel perforation and distal rectosigmoid obstruction.  Is also evidence of left ureteral compression.  She underwent a transverse diverting colostomy and subsequent placement of ureteral stent.  She is now completed external beam radiotherapy in addition to Taxol and carboplatin for 6 cycles.  Cycles administered in August 2019.  Subsequent imaging is notable for reduction in the size of the left pelvic sidewall lesion.  Ca1 25 was normalized  Dr. Marin Scott has referred the patient and her family to discuss the surgical options.  There is residual mass within the pelvis along the sidewall.  It is unclear whether that represents active disease.  Surgical resection would likely require exenteration, and given the location of this lesion and its unlikely that negative margins could be obtained.  Given her co-morbidities and  attempting an exenteration that is likely to have positive margins does not appear wise.  I presented the patient and her family with 3 options.  #1 PET scan to identify whether the residual mass is representative of active disease.  The PET scan is negative and no other lesions are identified then surveillance could be entertained.  #2.  Assumption that there is residual malignancy at which time  treatment options for consideration would the electrosol with either with temsirolimus  or everolimus.  Other consideration would be to assess this malignancy for  ER PR status and PDL 1 or MSI information respect to considering either hormonal and agents or pembro  #3.  Supportive care  The patient and her family wish to proceed with the PET scan and discuss next steps after that.  We discussed that she is at high risk for recurrence.  Follow-up March 02, 2018 with GYN oncology  The visit and discussion lasted  an hour.  HPI: Shannon Scott is a 77 y.o.  old G2P2 who was initially seen in our practice by Dr. Alycia Scott or grade 1-2 endometrial cancer in 2017.  The patient began noticing postmenopausal bleeding in June, 2017. She saw her primary care physician who determined there was no blood in the urine, and she was then referred to Dr Shannon Scott who, on 11/21/15 performed a TVUS which showed a uterus measuring 6x2.7x3.3cm with normal ovaries. There was a thickened endometrium of 6.77m. A pipelle endometrial biopsy was performed on 11/21/15 which showed FIGO grade 1-2 endometrial cancer.  The pap from the same date was normal.  The patient has a history of familial hypercholesterolemia. She has a history of a 3 vessel CABG in 2005. She has never had a CVA, and recent carotid dopplers show 50% occlusion. She reports symptoms concerning for LE claudication with ambulation and stairs. .  On 12/30/15 she underwent robotic assisted total hysterectomy, BSO, pelvic and PA SLN biopsy. 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  Postoperative Diagnosis: same.  Surgery: Total robotic hysterectomy bilateral salpingo-oophorectomy, left pelvic, right para-aortic and bifurcation SLN removal  Operative findings:  1) Colon adherent to posterior uterus,  left sidewall and appendix 2) 2 right para-aortic nodes for SLN (high and PA node) 3) Aortic bifurcation nodes 4) Left obturator node  Diagnosis 1. Lymph node, sentinel, biopsy, right obturator - ONE BENIGN LYMPH NODE (0/1). 2. Lymph node, sentinel, biopsy, right peri-aortic - ONE BENIGN  LYMPH NODE (0/1). 3. Lymph node, sentinel, biopsy, right lower peri-aortic - ONE BENIGN LYMPH NODE (0/1). 4. Lymph node, sentinel, biopsy, aortic bifurcation - ONE BENIGN LYMPH NODE (0/1). 5. Lymph node, sentinel, biopsy, left obturator - ONE BENIGN LYMPH NODE (0/1). 6. Uterus +/- tubes/ovaries, neoplastic - ENDOMETRIAL ADENOCARCINOMA, 1.7 CM WITH SUPERFICIAL MYOMETRIAL INVASION. - MARGINS NOT INVOLVED. - CERVIX, BILATERAL OVARIES AND BILATERAL FALLOPIAN TUBES FREE OF TUMOR. Microscopic Comment 6. ONCOLOGY TABLE-UTERUS, CARCINOMA OR CARCINOSARCOMA Specimen: Uterus with bilateral fallopian tubes and ovaries and sentinel lymph node biopsies. Procedure: Hysterectomy with sentinel lymph nodes. Lymph node sampling performed: Yes. Specimen integrity: Intact. Maximum tumor size: 1.7 cm Histologic type: Endometrioid with squamous differentiation. Grade: 2 Myometrial invasion: 0.3 cm where myometrium is 1 cm in thickness Cervical stromal involvement: No. Extent of involvement of other organs: None, identified. Lymph - vascular invasion: Not identified.  Interval History: 05/03/16 CT guided drainage: IMPRESSION: Successful CT guided aspiration of approximately 15 cc of serous, slightly cloudy / milky fluid from the residual collection within the left hemipelvis. All aspirated samples were sent to the laboratory for cytologic and gram stain analysis.  Cytology was negative.  Shannon Scott in February 2019 presented to her primary physician with complaints of rectal bleeding.  She was scheduled for a colonoscopy and had taken the bowel prep when she presented with obstruction of her distal sigmoid colon.  Imaging 08/18/2017 The left ureter is dilated to the left hemipelvis. It becomes narrowed in the inflammatory pelvic mass. This is described below. Bladder is within normal limits.  Stomach/Bowel: Large hiatal hernia is unchanged. Stable prominent gastric folds within the hiatal  hernia.  The colon is diffusely distended. Distal small bowel is distended. A transition point between dilated large bowel and decompressed large bowel occurs in the sigmoid colon. There is a heterogeneous ill-defined mass involving the sigmoid colon and left side of the pelvis which includes the colon wall, adjacent fat, and both fluid and gas elements. Findings are suspicious for a focal perforation with abscess formation. Underlying malignancy is a strong consideration given the appearance and colon obstruction. The mass also involves the left ureter causing an element of left ureteral dilatation worrisome for an element of left ureteral obstruction. The gas and fluid collection is very small measuring 1.8 x 1.2 cm on  image 109 of series 4. Overall mass size including the involved colon is 5.7 x 5.3 cm.  On August 21, 2017 she underwent a diverting laparoscopic transverse loop colostomy and subsequent placement of a left ureteral stent.  He then received pelvic external beam radiation therapy followed by 6 cycles of Taxol and carboplatin.  The last treatment was administered in August 2019. CT of the abdomen and pelvis on January 18, 2018 is notable for the presence of an IVC filter and 11 mm left internal iliac node that was previously 1.6 cm and a soft tissue mass in the left pelvic sidewall and now measuring 3.9 x 2.5 cm.  Dr. Oliver Pila corresponded with Dr. Denman George regarding next best steps in January 31, 2018.  At that time the recommendation was shared that after 50 Gy radiation therapy to the pelvis the only appropriate surgery for recurrent endometrial cancer  with be a total pelvic exoneration with permanent colostomy and urostomy.  That step is only recommended in the face of a central recurrence.  Given that this is on the sidewall it is unlikely that negative margins would be established.  There is also the possibility that there will continue to be involution of this mass with some  time.   [name] and her family present today for discussion regarding surgery.  Review of Systems  Constitutional:  Denies fever. Skin: No rash Cardiovascular: No chest pain, shortness of breath, or edema  Pulmonary: No cough  Gastro Intestinal: Reporting intermittent bulging at the site of the ostomy, with infrequent feeling of fullness in the area.  No nausea, vomiting, constipation, or diarrhea reported.  Reports passage of gas from the rectum Genitourinary: Denies vaginal bleeding and discharge.  Musculoskeletal: No joint pain.  Neurologic: No weakness Psychology: A bit frustrated  Current Meds:  Outpatient Encounter Medications as of 02/16/2018  Medication Sig  . lipase/protease/amylase (CREON) 36000 UNITS CPEP capsule Take 2 capsules (72,000 Units total) by mouth 3 (three) times daily before meals.  Marland Kitchen loperamide (IMODIUM) 2 MG capsule Take 2 mg by mouth as needed for diarrhea or loose stools.   . mirtazapine (REMERON) 15 MG tablet Take 1 tablet (15 mg total) by mouth at bedtime.  . ondansetron (ZOFRAN) 8 MG tablet Take 1 tablet (8 mg total) by mouth every 8 (eight) hours as needed for nausea or vomiting.  . pantoprazole (PROTONIX) 40 MG tablet Take 1 tablet (40 mg total) by mouth daily.  . polyethylene glycol (MIRALAX / GLYCOLAX) packet Take 17 g by mouth daily.  Marland Kitchen senna-docusate (SENOKOT-S) 8.6-50 MG tablet Take 1 tablet by mouth 2 (two) times daily.  . traMADol (ULTRAM) 50 MG tablet Take 1 tablet (50 mg total) by mouth every 6 (six) hours as needed for moderate pain.  Marland Kitchen acetaminophen (TYLENOL) 500 MG tablet Take 500 mg by mouth every 6 (six) hours as needed for mild pain, moderate pain, fever or headache.  Marland Kitchen amoxicillin (AMOXIL) 500 MG tablet Take four tablets prior to dental procedure  . belladonna-PHENObarbital (DONNATAL) 16.2 MG/5ML ELIX Take 5 mLs (16.2 mg total) by mouth 4 (four) times daily.  . diclofenac sodium (VOLTAREN) 1 % GEL Apply 2 g topically 4 (four) times  daily.  Marland Kitchen diltiazem (TIAZAC) 300 MG 24 hr capsule Take 300 mg by mouth daily.  Marland Kitchen dronabinol (MARINOL) 2.5 MG capsule TAKE 1 CAPSULE BY MOUTH TWICE DAILY BEFORE LUNCH AND SUPPER  . lactose free nutrition (BOOST PLUS) LIQD Take 237 mLs by mouth 3 (three) times daily with meals.  Marland Kitchen levothyroxine (SYNTHROID, LEVOTHROID) 50 MCG tablet Take 1 tablet (50 mcg total) by mouth daily before breakfast.  . lidocaine-prilocaine (EMLA) cream Apply 1 application topically as needed.  Marland Kitchen losartan (COZAAR) 100 MG tablet Take 100 mg by mouth daily.  . prochlorperazine (COMPAZINE) 10 MG tablet Take 1 tablet (10 mg total) by mouth every 6 (six) hours as needed for nausea or vomiting. (Patient not taking: Reported on 02/16/2018)   Facility-Administered Encounter Medications as of 02/16/2018  Medication  . heparin lock flush 100 unit/mL  . sodium chloride flush (NS) 0.9 % injection 10 mL  . sodium chloride flush (NS) 0.9 % injection 10 mL    Allergy:  Allergies  Allergen Reactions  . Statins Other (See Comments)    Myalgias and memory problems  . Ciprofloxacin Itching    Splotchy redness with itching during IV infusion localized  to arm.    Social Hx:   Social History   Socioeconomic History  . Marital status: Widowed    Spouse name: Not on file  . Number of children: 2  . Years of education: Not on file  . Highest education level: Not on file  Occupational History    Employer: RETIRED  Social Needs  . Financial resource strain: Not on file  . Food insecurity:    Worry: Not on file    Inability: Not on file  . Transportation needs:    Medical: Not on file    Non-medical: Not on file  Tobacco Use  . Smoking status: Never Smoker  . Smokeless tobacco: Never Used  Substance and Sexual Activity  . Alcohol use: No  . Drug use: No  . Sexual activity: Not on file  Lifestyle  . Physical activity:    Days per week: Not on file    Minutes per session: Not on file  . Stress: Not on file   Relationships  . Social connections:    Talks on phone: Not on file    Gets together: Not on file    Attends religious service: Not on file    Active member of club or organization: Not on file    Attends meetings of clubs or organizations: Not on file    Relationship status: Not on file  . Intimate partner violence:    Fear of current or ex partner: Not on file    Emotionally abused: Not on file    Physically abused: Not on file    Forced sexual activity: Not on file  Other Topics Concern  . Not on file  Social History Narrative  . Not on file    Past Surgical Hx:  Past Surgical History:  Procedure Laterality Date  . ABDOMINAL HYSTERECTOMY    . Carotid Doppler  02/2012   40-59% right int carotid artery stenosis; 60-79% L int carotid stenosis; L carotid bruit  . CORONARY ARTERY BYPASS GRAFT  03/12/2004   LIMA to LAD, SVG to circumflex, SVG to PDA  . CYSTOSCOPY W/ URETERAL STENT PLACEMENT Left 12/06/2017   Procedure: CYSTOSCOPY WITH LEFT URETERAL STENT EXCHANGE;  Surgeon: Irine Seal, MD;  Location: [facility];  Service: Urology;  Laterality: Left;  . CYSTOSCOPY WITH STENT PLACEMENT Bilateral 08/21/2017   Procedure: CYSTOSCOPY WITH STENT PLACEMENT;  Surgeon: Ceasar Mons, MD;  Location: [facility];  Service: Urology;  Laterality: Bilateral;  . IR FLUORO GUIDE PORT INSERTION RIGHT  10/11/2017  . IR GENERIC HISTORICAL  04/29/2016   IR RADIOLOGIST EVAL & MGMT 04/29/2016 Sandi Mariscal, MD [facility]  . IR GENERIC HISTORICAL  05/12/2016   IR RADIOLOGIST EVAL & MGMT 05/12/2016 Sandi Mariscal, MD [facility]  . IR IVC FILTER PLMT / S&I /IMG GUID/MOD SED  10/11/2017  . IR US GUIDE VASC ACCESS RIGHT  10/11/2017  . ROBOTIC ASSISTED TOTAL HYSTERECTOMY WITH BILATERAL SALPINGO OOPHERECTOMY Bilateral 12/30/2015   Procedure: XI ROBOTIC ASSISTED TOTAL HYSTERECTOMY WITH BILATERAL SALPINGO OOPHORECTOMY WITH SENTAL LYMPH NODE BIOPSY;  Surgeon: Nancy Marus, MD;  Location: [facility];  Service:  Gynecology;  Laterality: Bilateral;  . TONSILLECTOMY    . TRANSTHORACIC ECHOCARDIOGRAM  04/2007   EF>55%; mild MR; mild-mod TR; mild pulm HTN; mild calcification of aortiv valve leaflets with mild valvular aortic stenosis  . TUBAL LIGATION      Past Medical Hx:  Past Medical History:  Diagnosis Date  . Arthritis   . Asthma  allergy induced  . Bilateral carotid artery disease (HCC)    L carotid bruit  . Bursitis    left hip  . CAD (coronary artery disease)   . Cancer ([place])   . Dyslipidemia    intolerant to statins, welchol, niacin, zetia  . Goals of care, counseling/discussion 10/11/2017  . History of blood transfusion   . History of nuclear stress test 04/24/2012   lexiscan; normal study  . Hypertension   . Hypothyroidism   . 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 ([place]) 10/11/2017  . Postoperative nausea and vomiting 01/02/2016    Past Gynecological History:  See HPI  No LMP recorded. Patient has had a hysterectomy.  Family Hx:  Family History  Problem Relation Age of Onset  . Hypertension Mother   . Heart disease Mother        Died in her 69s  . Stroke Father   . Kidney disease Brother   . Heart disease Brother        also HTN, hyperlipidemia  . Heart attack Brother   . Stroke Sister        x2  . Hypertension Sister     Vitals:  Blood pressure (!) 156/57, pulse 74, temperature 97.9 F (36.6 C), temperature source Oral, resp. rate 18, height _0  (1.549 m), weight 143 lb (64.9 kg), SpO2 98 %. BMI 28kg/m2  Physical Exam: WD in NAD  Neck: No lymphadenopathy, no thyromegaly.  Lungs: Clear to auscultation bilaterally.  Cardiac: Regular rate and rhythm with a 2/6 systolic ejection murmur  Abdomen: Colostomy the present mid abdomen with parastomal hernia that is nontender, no evidence of intestinal prolapse abdomen is soft, nontender, nondistended. T  Groins: No lymphadenopathy.  Back: No CVA  tenderness  Extremities: Bilateral trace edema to the ankles.  Genito Urinary: External genitalia within normal limits. Vagina is markedly atrophic. The vaginal cuff is visualized. There are no visible lesions. There is no bleeding. There is no discharge.. Bimanual examination reveals a mass palpable at the left pelvic sidewall. Rectal confirms  Janie Morning, MD  02/16/2018, 3:37 PM

## 2018-02-16 NOTE — Patient Instructions (Signed)
You will have the PET Scan on 02-28-18 and discuss the results with Dr. Skeet Latch on 03-02-18. If you have any questions or concerns prior to this visit ca the office at [phone].

## 2018-02-28 ENCOUNTER — Ambulatory Visit (HOSPITAL_COMMUNITY)
Admission: RE | Admit: 2018-02-28 | Discharge: 2018-02-28 | Disposition: A | Discharge status: Home / Self Care | Payer: Medicare Other | Source: Ambulatory Visit | Attending: Gynecologic Oncology | Admitting: Gynecologic Oncology

## 2018-02-28 DIAGNOSIS — C541 Malignant neoplasm of endometrium: Secondary | ICD-10-CM

## 2018-02-28 DIAGNOSIS — I708 Atherosclerosis of other arteries: Secondary | ICD-10-CM | POA: Diagnosis not present

## 2018-02-28 DIAGNOSIS — I700 Atherosclerosis of aorta: Secondary | ICD-10-CM | POA: Insufficient documentation

## 2018-02-28 LAB — GLUCOSE, CAPILLARY: Glucose-Capillary: 97 mg/dL (ref 70–99)

## 2018-02-28 MED ORDER — FLUDEOXYGLUCOSE F - 18 (FDG) INJECTION
7.1200 | Freq: Once | INTRAVENOUS | Status: AC | PRN
  Administered 2018-02-28: 7.12 via INTRAVENOUS

## 2018-03-01 NOTE — Progress Notes (Signed)
GYN ONCOLOGY OFFICE VISIT    YANITZA SHVARTSMAN 77 y.o. female  CC: Persistent metastatic endometrial cancer.    Assessment/Plan:  Ms. CRYSTALANN KORF  is a 77 y.o.  year old with Stage IA grade 2 endometrioid adenocarcinoma.  Status post surgical staging  12/30/2015. She only had 30% depth of myometrial invasion, no lymphovascular space involvement, and low risk factors for recurrence therefore adjuvant therapy not recommended in accordance to NCCN guidelines.   In March 2019 she presented with a bowel perforation and distal rectosigmoid obstruction.  Is also evidence of left ureteral compression.  She underwent a transverse diverting colostomy and subsequent placement of ureteral stent.  She is now completed external beam radiotherapy in addition to Taxol and carboplatin for 6 cycles.  Cycles administered in August 2019.  Subsequent imaging is notable for reduction in the size of the left pelvic sidewall lesion.  Ca1 25 was normalized  Dr. Marin Olp has referred the patient and her family to discuss the surgical options.  There is residual mass, hypermetabolic, within the pelvis along the sidewall in a previously radiated area.  Given the location of this lesion, our inability to obtain negative margins and her comorbidities, a surgical approach is not prudent .  Molecular assessment of the tumor indicates that this is an MSI high tumor that is ER positive.  The treatment options include pembro vs hormonal therapy with evirolimus in combination with letrozole.   My recommendation is for administration of immunotherapy.  This recommendation was discussed with Dr. Marin Olp.   Session was held with the patient and her children lasting approximately 15 minutes.  Follow-up with Dr. Marin Olp Follow-up with Gyn Oncology in 3 months.    HPI: Marieke Lubke is a 77 y.o.  old G2P2 who was initially seen in our practice by Dr. Alycia Rossetti or grade 1-2 endometrial cancer in 2017.  The patient began  noticing postmenopausal bleeding in June, 2017. She saw her primary care physician who determined there was no blood in the urine, and she was then referred to Dr Benjie Karvonen who, on 11/21/15 performed a TVUS which showed a uterus measuring 6x2.7x3.3cm with normal ovaries. There was a thickened endometrium of 6.36m. A pipelle endometrial biopsy was performed on 11/21/15 which showed FIGO grade 1-2 endometrial cancer.  The pap from the same date was normal.  The patient has a history of familial hypercholesterolemia. She has a history of a 3 vessel CABG in 2005. She has never had a CVA, and recent carotid dopplers show 50% occlusion. She reports symptoms concerning for LE claudication with ambulation and stairs. .  On 12/30/15 she underwent robotic assisted total hysterectomy, BSO, pelvic and PA SLN biopsy. 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  Postoperative Diagnosis: same.  Surgery: Total robotic hysterectomy bilateral salpingo-oophorectomy, left pelvic, right para-aortic and bifurcation SLN removal  Operative findings:  1) Colon adherent to posterior uterus, left sidewall and appendix 2) 2 right para-aortic nodes for SLN (high and PA node) 3) Aortic bifurcation nodes 4) Left obturator node  Diagnosis 1. Lymph node, sentinel, biopsy, right obturator - ONE BENIGN LYMPH NODE (0/1). 2. Lymph node, sentinel, biopsy, right peri-aortic - ONE BENIGN LYMPH NODE (0/1). 3. Lymph node, sentinel, biopsy, right lower peri-aortic - ONE BENIGN LYMPH NODE (0/1). 4. Lymph node, sentinel, biopsy, aortic bifurcation - ONE BENIGN LYMPH NODE (0/1). 5. Lymph node, sentinel, biopsy, left obturator - ONE BENIGN LYMPH NODE (0/1). 6. Uterus +/- tubes/ovaries, neoplastic - ENDOMETRIAL ADENOCARCINOMA, 1.7 CM WITH  SUPERFICIAL MYOMETRIAL INVASION. - MARGINS NOT INVOLVED. - CERVIX, BILATERAL OVARIES AND BILATERAL FALLOPIAN TUBES FREE OF TUMOR. Microscopic Comment 6. ONCOLOGY  TABLE-UTERUS, CARCINOMA OR CARCINOSARCOMA Specimen: Uterus with bilateral fallopian tubes and ovaries and sentinel lymph node biopsies. Procedure: Hysterectomy with sentinel lymph nodes. Lymph node sampling performed: Yes. Specimen integrity: Intact. Maximum tumor size: 1.7 cm Histologic type: Endometrioid with squamous differentiation. Grade: 2 Myometrial invasion: 0.3 cm where myometrium is 1 cm in thickness Cervical stromal involvement: No. Extent of involvement of other organs: None, identified. Lymph - vascular invasion: Not identified.  Interval History: 05/03/16 CT guided drainage: IMPRESSION: Successful CT guided aspiration of approximately 15 cc of serous, slightly cloudy / milky fluid from the residual collection within the left hemipelvis. All aspirated samples were sent to the laboratory for cytologic and gram stain analysis.  Cytology was negative.  KHAMRYN CALDERONE in February 2019 presented to her primary physician with complaints of rectal bleeding.  She was scheduled for a colonoscopy and had taken the bowel prep when she presented with obstruction of her distal sigmoid colon.  Imaging 08/18/2017 The left ureter is dilated to the left hemipelvis. It becomes narrowed in the inflammatory pelvic mass. This is described below. Bladder is within normal limits.  Stomach/Bowel: Large hiatal hernia is unchanged. Stable prominent gastric folds within the hiatal hernia.  The colon is diffusely distended. Distal small bowel is distended. A transition point between dilated large bowel and decompressed large bowel occurs in the sigmoid colon. There is a heterogeneous ill-defined mass involving the sigmoid colon and left side of the pelvis which includes the colon wall, adjacent fat, and both fluid and gas elements. Findings are suspicious for a focal perforation with abscess formation. Underlying malignancy is a strong consideration given the appearance and colon obstruction. The  mass also involves the left ureter causing an element of left ureteral dilatation worrisome for an element of left ureteral obstruction. The gas and fluid collection is very small measuring 1.8 x 1.2 cm on  image 109 of series 4. Overall mass size including the involved colon is 5.7 x 5.3 cm.  On August 21, 2017 she underwent a diverting laparoscopic transverse loop colostomy and subsequent placement of a left ureteral stent.  He then received pelvic external beam radiation therapy followed by 6 cycles of Taxol and carboplatin.  The last treatment was administered in August 2019. CT of the abdomen and pelvis on January 18, 2018 is notable for the presence of an IVC filter and 11 mm left internal iliac node that was previously 1.6 cm and a soft tissue mass in the left pelvic sidewall and now measuring 3.9 x 2.5 cm.  PET imaging February 28, 2018 demonstrates a hypermetabolic left pelvic sidewall mass, greater than 3 weeks after completion of radiotherapy.  [name] and her family present today for discussion regarding treatment options  Review of Systems  Constitutional:  Denies fever. Skin: No rash Cardiovascular: No chest pain, shortness of breath, or edema  Pulmonary: No cough  Gastro Intestinal: Reporting intermittent bulging at the site of the ostomy, with infrequent feeling of fullness in the area.  No nausea, vomiting, constipation, or diarrhea reported.  Reports passage of gas from the rectum, reports left-sided pelvic discomfort Genitourinary: Denies vaginal bleeding and discharge.  Musculoskeletal: No joint pain.  Neurologic: No weakness Psychology: A bit frustrated  Current Meds:  Outpatient Encounter Medications as of 03/02/2018  Medication Sig  . acetaminophen (TYLENOL) 500 MG tablet Take 500 mg by  mouth every 6 (six) hours as needed for mild pain, moderate pain, fever or headache.  Marland Kitchen amoxicillin (AMOXIL) 500 MG tablet Take four tablets prior to dental procedure  .  belladonna-PHENObarbital (DONNATAL) 16.2 MG/5ML ELIX Take 5 mLs (16.2 mg total) by mouth 4 (four) times daily.  . diclofenac sodium (VOLTAREN) 1 % GEL Apply 2 g topically 4 (four) times daily.  Marland Kitchen diltiazem (TIAZAC) 300 MG 24 hr capsule Take 300 mg by mouth daily.  Marland Kitchen dronabinol (MARINOL) 2.5 MG capsule TAKE 1 CAPSULE BY MOUTH TWICE DAILY BEFORE LUNCH AND SUPPER  . lactose free nutrition (BOOST PLUS) LIQD Take 237 mLs by mouth 3 (three) times daily with meals.  Marland Kitchen levothyroxine (SYNTHROID, LEVOTHROID) 50 MCG tablet Take 1 tablet (50 mcg total) by mouth daily before breakfast.  . lidocaine-prilocaine (EMLA) cream Apply 1 application topically as needed.  . lipase/protease/amylase (CREON) 36000 UNITS CPEP capsule Take 2 capsules (72,000 Units total) by mouth 3 (three) times daily before meals.  Marland Kitchen loperamide (IMODIUM) 2 MG capsule Take 2 mg by mouth as needed for diarrhea or loose stools.   Marland Kitchen losartan (COZAAR) 100 MG tablet Take 100 mg by mouth daily.  . mirtazapine (REMERON) 15 MG tablet Take 1 tablet (15 mg total) by mouth at bedtime.  . ondansetron (ZOFRAN) 8 MG tablet Take 1 tablet (8 mg total) by mouth every 8 (eight) hours as needed for nausea or vomiting.  . pantoprazole (PROTONIX) 40 MG tablet Take 1 tablet (40 mg total) by mouth daily.  . polyethylene glycol (MIRALAX / GLYCOLAX) packet Take 17 g by mouth daily.  . prochlorperazine (COMPAZINE) 10 MG tablet Take 1 tablet (10 mg total) by mouth every 6 (six) hours as needed for nausea or vomiting. (Patient not taking: Reported on 02/16/2018)  . senna-docusate (SENOKOT-S) 8.6-50 MG tablet Take 1 tablet by mouth 2 (two) times daily.  . traMADol (ULTRAM) 50 MG tablet Take 1 tablet (50 mg total) by mouth every 6 (six) hours as needed for moderate pain.   Facility-Administered Encounter Medications as of 03/02/2018  Medication  . heparin lock flush 100 unit/mL  . sodium chloride flush (NS) 0.9 % injection 10 mL  . sodium chloride flush (NS) 0.9 %  injection 10 mL    Allergy:  Allergies  Allergen Reactions  . Statins Other (See Comments)    Myalgias and memory problems  . Ciprofloxacin Itching    Splotchy redness with itching during IV infusion localized to arm.    Social Hx:   Social History   Socioeconomic History  . Marital status: Widowed    Spouse name: Not on file  . Number of children: 2  . Years of education: Not on file  . Highest education level: Not on file  Occupational History    Employer: RETIRED  Social Needs  . Financial resource strain: Not on file  . Food insecurity:    Worry: Not on file    Inability: Not on file  . Transportation needs:    Medical: Not on file    Non-medical: Not on file  Tobacco Use  . Smoking status: Never Smoker  . Smokeless tobacco: Never Used  Substance and Sexual Activity  . Alcohol use: No  . Drug use: No  . Sexual activity: Not on file  Lifestyle  . Physical activity:    Days per week: Not on file    Minutes per session: Not on file  . Stress: Not on file  Relationships  . Social connections:  Talks on phone: Not on file    Gets together: Not on file    Attends religious service: Not on file    Active member of club or organization: Not on file    Attends meetings of clubs or organizations: Not on file    Relationship status: Not on file  . Intimate partner violence:    Fear of current or ex partner: Not on file    Emotionally abused: Not on file    Physically abused: Not on file    Forced sexual activity: Not on file  Other Topics Concern  . Not on file  Social History Narrative  . Not on file    Past Surgical Hx:  Past Surgical History:  Procedure Laterality Date  . ABDOMINAL HYSTERECTOMY    . Carotid Doppler  02/2012   40-59% right int carotid artery stenosis; 60-79% L int carotid stenosis; L carotid bruit  . CORONARY ARTERY BYPASS GRAFT  03/12/2004   LIMA to LAD, SVG to circumflex, SVG to PDA  . CYSTOSCOPY W/ URETERAL STENT PLACEMENT Left  12/06/2017   Procedure: CYSTOSCOPY WITH LEFT URETERAL STENT EXCHANGE;  Surgeon: Irine Seal, MD;  Location: [facility];  Service: Urology;  Laterality: Left;  . CYSTOSCOPY WITH STENT PLACEMENT Bilateral 08/21/2017   Procedure: CYSTOSCOPY WITH STENT PLACEMENT;  Surgeon: Ceasar Mons, MD;  Location: [facility];  Service: Urology;  Laterality: Bilateral;  . IR FLUORO GUIDE PORT INSERTION RIGHT  10/11/2017  . IR GENERIC HISTORICAL  04/29/2016   IR RADIOLOGIST EVAL & MGMT 04/29/2016 Sandi Mariscal, MD [facility]  . IR GENERIC HISTORICAL  05/12/2016   IR RADIOLOGIST EVAL & MGMT 05/12/2016 Sandi Mariscal, MD [facility]  . IR IVC FILTER PLMT / S&I /IMG GUID/MOD SED  10/11/2017  . IR US GUIDE VASC ACCESS RIGHT  10/11/2017  . ROBOTIC ASSISTED TOTAL HYSTERECTOMY WITH BILATERAL SALPINGO OOPHERECTOMY Bilateral 12/30/2015   Procedure: XI ROBOTIC ASSISTED TOTAL HYSTERECTOMY WITH BILATERAL SALPINGO OOPHORECTOMY WITH SENTAL LYMPH NODE BIOPSY;  Surgeon: Nancy Marus, MD;  Location: [facility];  Service: Gynecology;  Laterality: Bilateral;  . TONSILLECTOMY    . TRANSTHORACIC ECHOCARDIOGRAM  04/2007   EF>55%; mild MR; mild-mod TR; mild pulm HTN; mild calcification of aortiv valve leaflets with mild valvular aortic stenosis  . TUBAL LIGATION      Past Medical Hx:  Past Medical History:  Diagnosis Date  . Arthritis   . Asthma    allergy induced  . Bilateral carotid artery disease (H[facility])    L carotid bruit  . Bursitis    left hip  . CAD (coronary artery disease)   . Cancer ([place])   . Dyslipidemia    intolerant to statins, welchol, niacin, zetia  . Goals of care, counseling/discussion 10/11/2017  . History of blood transfusion   . History of nuclear stress test 04/24/2012   lexiscan; normal study  . Hypertension   . Hypothyroidism   . 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 ([place]) 10/11/2017  . Postoperative nausea and vomiting 01/02/2016    Past Gynecological  History:  See HPI  No LMP recorded. Patient has had a hysterectomy.  Family Hx:  Family History  Problem Relation Age of Onset  . Hypertension Mother   . Heart disease Mother        Died in her 86s  . Stroke Father   . Kidney disease Brother   . Heart disease Brother        also  HTN, hyperlipidemia  . Heart attack Brother   . Stroke Sister        x2  . Hypertension Sister     Vitals:  Blood pressure (!) 161/60, pulse 77, temperature 98.6 F (37 C), temperature source Oral, resp. rate 16, height 5' 1"  (1.549 m), weight 141 lb 8 oz (64.2 kg), SpO2 97 %. BMI 28kg/m2  Physical Exam: WD in NAD

## 2018-03-02 ENCOUNTER — Inpatient Hospital Stay: Payer: Medicare Other | Attending: Hematology & Oncology | Admitting: Gynecologic Oncology

## 2018-03-02 ENCOUNTER — Encounter: Payer: Self-pay | Admitting: Gynecologic Oncology

## 2018-03-02 VITALS — BP 161/60 | HR 77 | Temp 98.6°F | Resp 16 | Ht 61.0 in | Wt 141.5 lb

## 2018-03-02 DIAGNOSIS — Z90722 Acquired absence of ovaries, bilateral: Secondary | ICD-10-CM | POA: Diagnosis not present

## 2018-03-02 DIAGNOSIS — R05 Cough: Secondary | ICD-10-CM | POA: Diagnosis not present

## 2018-03-02 DIAGNOSIS — M791 Myalgia, unspecified site: Secondary | ICD-10-CM | POA: Insufficient documentation

## 2018-03-02 DIAGNOSIS — R531 Weakness: Secondary | ICD-10-CM | POA: Diagnosis not present

## 2018-03-02 DIAGNOSIS — Z79899 Other long term (current) drug therapy: Secondary | ICD-10-CM | POA: Diagnosis not present

## 2018-03-02 DIAGNOSIS — Z933 Colostomy status: Secondary | ICD-10-CM | POA: Diagnosis not present

## 2018-03-02 DIAGNOSIS — Z90710 Acquired absence of both cervix and uterus: Secondary | ICD-10-CM | POA: Diagnosis not present

## 2018-03-02 DIAGNOSIS — R110 Nausea: Secondary | ICD-10-CM | POA: Insufficient documentation

## 2018-03-02 DIAGNOSIS — C541 Malignant neoplasm of endometrium: Secondary | ICD-10-CM | POA: Insufficient documentation

## 2018-03-02 DIAGNOSIS — R5383 Other fatigue: Secondary | ICD-10-CM | POA: Insufficient documentation

## 2018-03-02 DIAGNOSIS — Z923 Personal history of irradiation: Secondary | ICD-10-CM

## 2018-03-02 DIAGNOSIS — Z9221 Personal history of antineoplastic chemotherapy: Secondary | ICD-10-CM | POA: Insufficient documentation

## 2018-03-02 NOTE — Patient Instructions (Signed)
Plan to follow up with Dr. Skeet Latch in December 2019 or sooner if needed.  Please call for any needs.  Follow up with Dr. Marin Olp.

## 2018-03-10 ENCOUNTER — Other Ambulatory Visit: Payer: Self-pay

## 2018-03-10 ENCOUNTER — Encounter: Payer: Self-pay | Admitting: Hematology & Oncology

## 2018-03-10 ENCOUNTER — Inpatient Hospital Stay: Payer: Medicare Other

## 2018-03-10 ENCOUNTER — Inpatient Hospital Stay (HOSPITAL_BASED_OUTPATIENT_CLINIC_OR_DEPARTMENT_OTHER): Payer: Medicare Other | Admitting: Hematology & Oncology

## 2018-03-10 VITALS — BP 169/54 | HR 70 | Temp 98.2°F | Resp 18 | Wt 143.0 lb

## 2018-03-10 DIAGNOSIS — R5383 Other fatigue: Secondary | ICD-10-CM | POA: Diagnosis not present

## 2018-03-10 DIAGNOSIS — R110 Nausea: Secondary | ICD-10-CM

## 2018-03-10 DIAGNOSIS — Z933 Colostomy status: Secondary | ICD-10-CM

## 2018-03-10 DIAGNOSIS — C799 Secondary malignant neoplasm of unspecified site: Principal | ICD-10-CM

## 2018-03-10 DIAGNOSIS — D508 Other iron deficiency anemias: Secondary | ICD-10-CM

## 2018-03-10 DIAGNOSIS — Z95828 Presence of other vascular implants and grafts: Secondary | ICD-10-CM

## 2018-03-10 DIAGNOSIS — M791 Myalgia, unspecified site: Secondary | ICD-10-CM

## 2018-03-10 DIAGNOSIS — E039 Hypothyroidism, unspecified: Secondary | ICD-10-CM

## 2018-03-10 DIAGNOSIS — R531 Weakness: Secondary | ICD-10-CM | POA: Diagnosis not present

## 2018-03-10 DIAGNOSIS — D62 Acute posthemorrhagic anemia: Secondary | ICD-10-CM

## 2018-03-10 DIAGNOSIS — Z90710 Acquired absence of both cervix and uterus: Secondary | ICD-10-CM

## 2018-03-10 DIAGNOSIS — Z9221 Personal history of antineoplastic chemotherapy: Secondary | ICD-10-CM

## 2018-03-10 DIAGNOSIS — C539 Malignant neoplasm of cervix uteri, unspecified: Secondary | ICD-10-CM

## 2018-03-10 DIAGNOSIS — Z90722 Acquired absence of ovaries, bilateral: Secondary | ICD-10-CM

## 2018-03-10 DIAGNOSIS — Z923 Personal history of irradiation: Secondary | ICD-10-CM

## 2018-03-10 DIAGNOSIS — R05 Cough: Secondary | ICD-10-CM

## 2018-03-10 DIAGNOSIS — Z79899 Other long term (current) drug therapy: Secondary | ICD-10-CM

## 2018-03-10 DIAGNOSIS — C541 Malignant neoplasm of endometrium: Secondary | ICD-10-CM

## 2018-03-10 LAB — CBC WITH DIFFERENTIAL (CANCER CENTER ONLY)
Abs Immature Granulocytes: 0.01 10*3/uL (ref 0.00–0.07)
Basophils Absolute: 0 10*3/uL (ref 0.0–0.1)
Basophils Relative: 0 %
Eosinophils Absolute: 0.1 10*3/uL (ref 0.0–0.5)
Eosinophils Relative: 2 %
HCT: 35.8 % — ABNORMAL LOW (ref 36.0–46.0)
Hemoglobin: 11.6 g/dL — ABNORMAL LOW (ref 12.0–15.0)
Immature Granulocytes: 0 %
Lymphocytes Relative: 8 %
Lymphs Abs: 0.4 10*3/uL — ABNORMAL LOW (ref 0.7–4.0)
MCH: 30.9 pg (ref 26.0–34.0)
MCHC: 32.4 g/dL (ref 30.0–36.0)
MCV: 95.2 fL (ref 80.0–100.0)
Monocytes Absolute: 0.3 10*3/uL (ref 0.1–1.0)
Monocytes Relative: 6 %
Neutro Abs: 4.2 10*3/uL (ref 1.7–7.7)
Neutrophils Relative %: 84 %
Platelet Count: 207 10*3/uL (ref 150–400)
RBC: 3.76 MIL/uL — ABNORMAL LOW (ref 3.87–5.11)
RDW: 12.9 % (ref 11.5–15.5)
WBC Count: 5 10*3/uL (ref 4.0–10.5)
nRBC: 0 % (ref 0.0–0.2)

## 2018-03-10 LAB — CMP (CANCER CENTER ONLY)
ALT: 15 U/L (ref 10–47)
AST: 20 U/L (ref 11–38)
Albumin: 3.7 g/dL (ref 3.5–5.0)
Alkaline Phosphatase: 91 U/L — ABNORMAL HIGH (ref 26–84)
Anion gap: 1 — ABNORMAL LOW (ref 5–15)
BUN: 14 mg/dL (ref 7–22)
CO2: 31 mmol/L (ref 18–33)
Calcium: 9.7 mg/dL (ref 8.0–10.3)
Chloride: 107 mmol/L (ref 98–108)
Creatinine: 0.8 mg/dL (ref 0.60–1.20)
Glucose, Bld: 110 mg/dL (ref 73–118)
Potassium: 3.9 mmol/L (ref 3.3–4.7)
Sodium: 139 mmol/L (ref 128–145)
Total Bilirubin: 0.5 mg/dL (ref 0.2–1.6)
Total Protein: 7.5 g/dL (ref 6.4–8.1)

## 2018-03-10 MED ORDER — SODIUM CHLORIDE 0.9% FLUSH
10.0000 mL | INTRAVENOUS | Status: DC | PRN
  Administered 2018-03-10: 10 mL via INTRAVENOUS
  Filled 2018-03-10: qty 10

## 2018-03-10 MED ORDER — HEPARIN SOD (PORK) LOCK FLUSH 100 UNIT/ML IV SOLN
500.0000 [IU] | Freq: Once | INTRAVENOUS | Status: AC
  Administered 2018-03-10: 500 [IU] via INTRAVENOUS
  Filled 2018-03-10: qty 5

## 2018-03-10 NOTE — Progress Notes (Signed)
Hematology and Oncology Follow Up Visit  Shannon Scott [id number] 1940/07/01 77 y.o. 03/10/2018   Principle Diagnosis:  Locally advanced/recurrent endometrial carcinoma undifferentiated --  TMB (HIGH) / MSI HIGH  Current Therapy:   Radiation therapy for 5 -5 1/2 weeks - s/p 20 fractions Taxol/carboplatinumq 7 days -- s/p cycle #6 Pembrolizumab 200 mg IV q 3 wk -- start on 03/16/2018 IV iron as indicated - last received 11/10/2017   Interim History: Shannon Scott is here today with her daughter for follow-up.  Unfortunately, she is not a candidate for resection.  She has been seen by general surgery.  She has been seen by gynecologic oncology.  They have done a very thorough evaluation and work-up.  They just do not feel that with her tumor in his location that they could safely resect out the tumor.  They probably would have to do some form of pelvic exenteration which would certainly not be to the benefit of Shannon Scott.  I very much appreciate their very thorough evaluation and cautious recommendations.  Thankfully, Shannon Scott has the rare tumor that has a very high tumor mutation burden (28  Muts/mb) and a high MSI.  Because of this, we should be able to get immunotherapy to work for [place].  In addition, she has a PIK3CA mutation.  I really believe that she will do well with immunotherapy.  We did a PET scan on her.  This was done on October 1.  The PET scan showed a continued response to the treatment that we gave her.  The PET scan now shows a 3.1 x 2.3 cm mass in the left pelvic sidewall.  The SUV is 7.  She does have a left iliac node which now measures 6 mm.  I spent a good 45 minutes with she and her daughter explaining the role of immunotherapy and why I thought immunotherapy would work.  I went over the side effects of immunotherapy with them.  I gave her information about immunotherapy.  She has a colostomy so if diarrhea becomes a problem, then the colostomy  should be able to handle it.    Her appetite is doing well.  She is not having any nausea or vomiting.  She is having no leg swelling.  She is having no cough or shortness of breath.  Para she is still taking Creon which seems to be helping.  Currently, her performance status is ECOG 1.     Medications:  Allergies as of 03/10/2018      Reactions   Statins Other (See Comments)   Myalgias and memory problems   Ciprofloxacin Itching   Splotchy redness with itching during IV infusion localized to arm.      Medication List        Accurate as of 03/10/18  3:13 PM. Always use your most recent med list.          acetaminophen 500 MG tablet Commonly known as:  TYLENOL Take 500 mg by mouth every 6 (six) hours as needed for mild pain, moderate pain, fever or headache.   amoxicillin 500 MG tablet Commonly known as:  AMOXIL Take four tablets prior to dental procedure   belladonna-PHENObarbital 16.2 MG/5ML Elix Commonly known as:  DONNATAL Take 5 mLs (16.2 mg total) by mouth 4 (four) times daily.   diclofenac sodium 1 % Gel Commonly known as:  VOLTAREN Apply 2 g topically 4 (four) times daily.   diltiazem 300 MG 24 hr capsule Commonly known as:  TIAZAC Take 300 mg by mouth daily.   dronabinol 2.5 MG capsule Commonly known as:  MARINOL TAKE 1 CAPSULE BY MOUTH TWICE DAILY BEFORE LUNCH AND SUPPER   lactose free nutrition Liqd Take 237 mLs by mouth 3 (three) times daily with meals.   levothyroxine 50 MCG tablet Commonly known as:  SYNTHROID, LEVOTHROID Take 1 tablet (50 mcg total) by mouth daily before breakfast.   lidocaine-prilocaine cream Commonly known as:  EMLA Apply 1 application topically as needed.   lipase/protease/amylase 36000 UNITS Cpep capsule Commonly known as:  CREON Take 2 capsules (72,000 Units total) by mouth 3 (three) times daily before meals.   loperamide 2 MG capsule Commonly known as:  IMODIUM Take 2 mg by mouth as needed for diarrhea or loose  stools.   losartan 100 MG tablet Commonly known as:  COZAAR Take 100 mg by mouth daily.   mirtazapine 15 MG tablet Commonly known as:  REMERON Take 1 tablet (15 mg total) by mouth at bedtime.   ondansetron 8 MG tablet Commonly known as:  ZOFRAN Take 1 tablet (8 mg total) by mouth every 8 (eight) hours as needed for nausea or vomiting.   pantoprazole 40 MG tablet Commonly known as:  PROTONIX Take 1 tablet (40 mg total) by mouth daily.   polyethylene glycol packet Commonly known as:  MIRALAX / GLYCOLAX Take 17 g by mouth daily.   prochlorperazine 10 MG tablet Commonly known as:  COMPAZINE Take 1 tablet (10 mg total) by mouth every 6 (six) hours as needed for nausea or vomiting.   senna-docusate 8.6-50 MG tablet Commonly known as:  Senokot-S Take 1 tablet by mouth 2 (two) times daily.   traMADol 50 MG tablet Commonly known as:  ULTRAM Take 1 tablet (50 mg total) by mouth every 6 (six) hours as needed for moderate pain.       Allergies:  Allergies  Allergen Reactions  . Statins Other (See Comments)    Myalgias and memory problems  . Ciprofloxacin Itching    Splotchy redness with itching during IV infusion localized to arm.    Past Medical History, Surgical history, Social history, and Family History were reviewed and updated.  Review of Systems: Review of Systems  Constitutional: Positive for malaise/fatigue and weight loss.  HENT: Negative.   Eyes: Negative.   Respiratory: Positive for cough.   Cardiovascular: Negative.   Gastrointestinal: Positive for nausea.  Genitourinary: Positive for frequency.  Musculoskeletal: Positive for myalgias.  Skin: Negative.   Neurological: Negative.   Endo/Heme/Allergies: Negative.   Psychiatric/Behavioral: Negative.      Physical Exam:  weight is 143 lb (64.9 kg). Her oral temperature is 98.2 F (36.8 C). Her blood pressure is 169/54 (abnormal) and her pulse is 70. Her respiration is 18 and oxygen saturation is 98%.    Wt Readings from Last 3 Encounters:  03/10/18 143 lb (64.9 kg)  03/02/18 141 lb 8 oz (64.2 kg)  02/16/18 143 lb (64.9 kg)    Physical Exam  Constitutional: She is oriented to person, place, and time.  HENT:  Head: Normocephalic and atraumatic.  Mouth/Throat: Oropharynx is clear and moist.  Eyes: Pupils are equal, round, and reactive to light. EOM are normal.  Neck: Normal range of motion.  Cardiovascular: Normal rate, regular rhythm and normal heart sounds.  Pulmonary/Chest: Effort normal and breath sounds normal.  Abdominal: Soft. Bowel sounds are normal.  Musculoskeletal: Normal range of motion. She exhibits no edema, tenderness or deformity.  Lymphadenopathy:  She has no cervical adenopathy.  Neurological: She is alert and oriented to person, place, and time.  Skin: Skin is warm and dry. No rash noted. No erythema.  Psychiatric: She has a normal mood and affect. Her behavior is normal. Judgment and thought content normal.  Vitals reviewed.    Lab Results  Component Value Date   WBC 5.0 03/10/2018   HGB 11.6 (L) 03/10/2018   HCT 35.8 (L) 03/10/2018   MCV 95.2 03/10/2018   PLT 207 03/10/2018   Lab Results  Component Value Date   FERRITIN 722 (H) 01/27/2018   IRON 67 01/27/2018   TIBC 201 (L) 01/27/2018   UIBC 134 01/27/2018   IRONPCTSAT 33 01/27/2018   Lab Results  Component Value Date   RETICCTPCT 0.5 (L) 10/20/2017   RBC 3.76 (L) 03/10/2018   No results found for: KPAFRELGTCHN, LAMBDASER, KAPLAMBRATIO No results found for: IGGSERUM, IGA, IGMSERUM No results found for: Odetta Pink, SPEI   Chemistry      Component Value Date/Time   NA 139 03/10/2018 1050   NA 136 (A) 09/08/2017   K 3.9 03/10/2018 1050   CL 107 03/10/2018 1050   CO2 31 03/10/2018 1050   BUN 14 03/10/2018 1050   BUN 8 09/08/2017   CREATININE 0.80 03/10/2018 1050   CREATININE 0.69 08/02/2016 1519   GLU 111 09/08/2017       Component Value Date/Time   CALCIUM 9.7 03/10/2018 1050   ALKPHOS 91 (H) 03/10/2018 1050   AST 20 03/10/2018 1050   ALT 15 03/10/2018 1050   BILITOT 0.5 03/10/2018 1050      Impression and Plan: Ms. Vazguez is a very pleasant 77 yo caucasian female with recurrent undifferentiated endometrial carcinoma.   It certainly would be nice if she could have had surgery but this just is not going to be a possible recommendation for her.  Immunotherapy should work.  We will get her started next week.  Again I will use pembrolizumab which I would think have a good tolerability and low toxicity.  I will give her 4 cycles of pembrolizumab and then repeat her PET scan.  We will also utilize her CA - 125.  Prior to treatment, this was 143.  Her latest measurement in July was 15.8.  I would like to start treatment next week.  I will see her back for her second cycle of treatment in early November.  Volanda Napoleon, MD 10/11/20193:13 PM

## 2018-03-11 LAB — CA 125: Cancer Antigen (CA) 125: 7.9 U/mL (ref 0.0–38.1)

## 2018-03-14 LAB — IRON AND TIBC
Iron: 48 ug/dL (ref 41–142)
Saturation Ratios: 25 % (ref 21–57)
TIBC: 193 ug/dL — ABNORMAL LOW (ref 236–444)
UIBC: 145 ug/dL

## 2018-03-14 LAB — FERRITIN: Ferritin: 616 ng/mL — ABNORMAL HIGH (ref 11–307)

## 2018-03-15 ENCOUNTER — Telehealth: Payer: Self-pay

## 2018-03-15 NOTE — Telephone Encounter (Signed)
Daughter (we have permission to speak with her) called confirming when patient's last PET scan was - which was 10/1. No other needs per daughter at this time.

## 2018-03-16 ENCOUNTER — Inpatient Hospital Stay: Payer: Medicare Other

## 2018-03-16 VITALS — BP 188/52 | HR 67 | Temp 98.1°F | Resp 16

## 2018-03-16 DIAGNOSIS — D508 Other iron deficiency anemias: Secondary | ICD-10-CM

## 2018-03-16 DIAGNOSIS — C541 Malignant neoplasm of endometrium: Secondary | ICD-10-CM

## 2018-03-16 MED ORDER — SODIUM CHLORIDE 0.9 % IV SOLN
200.0000 mg | Freq: Once | INTRAVENOUS | Status: AC
  Administered 2018-03-16: 200 mg via INTRAVENOUS
  Filled 2018-03-16: qty 8

## 2018-03-16 MED ORDER — SODIUM CHLORIDE 0.9 % IV SOLN
Freq: Once | INTRAVENOUS | Status: DC
  Filled 2018-03-16: qty 250

## 2018-03-16 MED ORDER — HEPARIN SOD (PORK) LOCK FLUSH 100 UNIT/ML IV SOLN
500.0000 [IU] | Freq: Once | INTRAVENOUS | Status: AC | PRN
  Administered 2018-03-16: 500 [IU]
  Filled 2018-03-16: qty 5

## 2018-03-16 MED ORDER — SODIUM CHLORIDE 0.9% FLUSH
10.0000 mL | INTRAVENOUS | Status: DC | PRN
  Administered 2018-03-16: 10 mL
  Filled 2018-03-16: qty 10

## 2018-03-16 MED ORDER — SODIUM CHLORIDE 0.9 % IV SOLN: 510.0000 mg | Freq: Once | INTRAVENOUS | Status: DC

## 2018-03-16 MED ORDER — SODIUM CHLORIDE 0.9 % IV SOLN
Freq: Once | INTRAVENOUS | Status: AC
  Administered 2018-03-16: 12:00:00 via INTRAVENOUS
  Filled 2018-03-16: qty 250

## 2018-03-16 NOTE — Patient Instructions (Signed)
Pembrolizumab injection What is this medicine? PEMBROLIZUMAB (pem broe liz ue mab) is a monoclonal antibody. It is used to treat melanoma, head and neck cancer, Hodgkin lymphoma, non-small cell lung cancer, urothelial cancer, stomach cancer, and cancers that have a certain genetic condition. This medicine may be used for other purposes; ask your health care provider or pharmacist if you have questions. COMMON BRAND NAME(S): Keytruda What should I tell my health care provider before I take this medicine? They need to know if you have any of these conditions: -diabetes -immune system problems -inflammatory bowel disease -liver disease -lung or breathing disease -lupus -organ transplant -an unusual or allergic reaction to pembrolizumab, other medicines, foods, dyes, or preservatives -pregnant or trying to get pregnant -breast-feeding How should I use this medicine? This medicine is for infusion into a vein. It is given by a health care professional in a hospital or clinic setting. A special MedGuide will be given to you before each treatment. Be sure to read this information carefully each time. Talk to your pediatrician regarding the use of this medicine in children. While this drug may be prescribed for selected conditions, precautions do apply. Overdosage: If you think you have taken too much of this medicine contact a poison control center or emergency room at once. NOTE: This medicine is only for you. Do not share this medicine with others. What if I miss a dose? It is important not to miss your dose. Call your doctor or health care professional if you are unable to keep an appointment. What may interact with this medicine? Interactions have not been studied. Give your health care provider a list of all the medicines, herbs, non-prescription drugs, or dietary supplements you use. Also tell them if you smoke, drink alcohol, or use illegal drugs. Some items may interact with your  medicine. This list may not describe all possible interactions. Give your health care provider a list of all the medicines, herbs, non-prescription drugs, or dietary supplements you use. Also tell them if you smoke, drink alcohol, or use illegal drugs. Some items may interact with your medicine. What should I watch for while using this medicine? Your condition will be monitored carefully while you are receiving this medicine. You may need blood work done while you are taking this medicine. Do not become pregnant while taking this medicine or for 4 months after stopping it. Women should inform their doctor if they wish to become pregnant or think they might be pregnant. There is a potential for serious side effects to an unborn child. Talk to your health care professional or pharmacist for more information. Do not breast-feed an infant while taking this medicine or for 4 months after the last dose. What side effects may I notice from receiving this medicine? Side effects that you should report to your doctor or health care professional as soon as possible: -allergic reactions like skin rash, itching or hives, swelling of the face, lips, or tongue -bloody or black, tarry -breathing problems -changes in vision -chest pain -chills -constipation -cough -dizziness or feeling faint or lightheaded -fast or irregular heartbeat -fever -flushing -hair loss -low blood counts - this medicine may decrease the number of white blood cells, red blood cells and platelets. You may be at increased risk for infections and bleeding. -muscle pain -muscle weakness -persistent headache -signs and symptoms of high blood sugar such as dizziness; dry mouth; dry skin; fruity breath; nausea; stomach pain; increased hunger or thirst; increased urination -signs and symptoms of kidney  injury like trouble passing urine or change in the amount of urine -signs and symptoms of liver injury like dark urine, light-colored  stools, loss of appetite, nausea, right upper belly pain, yellowing of the eyes or skin -stomach pain -sweating -weight loss Side effects that usually do not require medical attention (report to your doctor or health care professional if they continue or are bothersome): -decreased appetite -diarrhea -tiredness This list may not describe all possible side effects. Call your doctor for medical advice about side effects. You may report side effects to FDA at 1-800-FDA-1088. Where should I keep my medicine? This drug is given in a hospital or clinic and will not be stored at home. NOTE: This sheet is a summary. It may not cover all possible information. If you have questions about this medicine, talk to your doctor, pharmacist, or health care provider.  2018 Elsevier/Gold Standard (2016-02-24 12:29:36)

## 2018-03-17 ENCOUNTER — Encounter: Payer: Self-pay | Admitting: Hematology & Oncology

## 2018-03-31 ENCOUNTER — Other Ambulatory Visit: Payer: Self-pay | Admitting: Hematology & Oncology

## 2018-04-06 ENCOUNTER — Other Ambulatory Visit: Payer: Self-pay

## 2018-04-06 ENCOUNTER — Inpatient Hospital Stay: Payer: Medicare Other

## 2018-04-06 ENCOUNTER — Other Ambulatory Visit: Payer: Self-pay | Admitting: Hematology & Oncology

## 2018-04-06 ENCOUNTER — Other Ambulatory Visit: Payer: Self-pay | Admitting: *Deleted

## 2018-04-06 ENCOUNTER — Encounter: Payer: Self-pay | Admitting: Hematology & Oncology

## 2018-04-06 ENCOUNTER — Inpatient Hospital Stay: Payer: Medicare Other | Attending: Hematology & Oncology | Admitting: Hematology & Oncology

## 2018-04-06 VITALS — BP 154/54 | HR 75 | Temp 98.6°F | Resp 18 | Wt 145.4 lb

## 2018-04-06 DIAGNOSIS — I8500 Esophageal varices without bleeding: Secondary | ICD-10-CM

## 2018-04-06 DIAGNOSIS — K7581 Nonalcoholic steatohepatitis (NASH): Secondary | ICD-10-CM

## 2018-04-06 DIAGNOSIS — Z7984 Long term (current) use of oral hypoglycemic drugs: Secondary | ICD-10-CM

## 2018-04-06 DIAGNOSIS — Z5112 Encounter for antineoplastic immunotherapy: Secondary | ICD-10-CM | POA: Diagnosis not present

## 2018-04-06 DIAGNOSIS — Z79899 Other long term (current) drug therapy: Secondary | ICD-10-CM | POA: Diagnosis not present

## 2018-04-06 DIAGNOSIS — C541 Malignant neoplasm of endometrium: Secondary | ICD-10-CM | POA: Diagnosis not present

## 2018-04-06 DIAGNOSIS — Z933 Colostomy status: Secondary | ICD-10-CM | POA: Diagnosis not present

## 2018-04-06 DIAGNOSIS — C539 Malignant neoplasm of cervix uteri, unspecified: Secondary | ICD-10-CM

## 2018-04-06 DIAGNOSIS — C799 Secondary malignant neoplasm of unspecified site: Secondary | ICD-10-CM

## 2018-04-06 DIAGNOSIS — D72819 Decreased white blood cell count, unspecified: Secondary | ICD-10-CM

## 2018-04-06 DIAGNOSIS — R5383 Other fatigue: Secondary | ICD-10-CM

## 2018-04-06 DIAGNOSIS — D500 Iron deficiency anemia secondary to blood loss (chronic): Secondary | ICD-10-CM | POA: Diagnosis not present

## 2018-04-06 DIAGNOSIS — D696 Thrombocytopenia, unspecified: Secondary | ICD-10-CM

## 2018-04-06 DIAGNOSIS — K3184 Gastroparesis: Secondary | ICD-10-CM

## 2018-04-06 DIAGNOSIS — E039 Hypothyroidism, unspecified: Secondary | ICD-10-CM

## 2018-04-06 DIAGNOSIS — R0602 Shortness of breath: Secondary | ICD-10-CM

## 2018-04-06 DIAGNOSIS — D508 Other iron deficiency anemias: Secondary | ICD-10-CM

## 2018-04-06 DIAGNOSIS — Z923 Personal history of irradiation: Secondary | ICD-10-CM | POA: Insufficient documentation

## 2018-04-06 DIAGNOSIS — R161 Splenomegaly, not elsewhere classified: Secondary | ICD-10-CM

## 2018-04-06 LAB — CMP (CANCER CENTER ONLY)
ALT: 31 U/L (ref 10–47)
AST: 29 U/L (ref 11–38)
Albumin: 3.7 g/dL (ref 3.5–5.0)
Alkaline Phosphatase: 112 U/L — ABNORMAL HIGH (ref 26–84)
Anion gap: 15 (ref 5–15)
BUN: 15 mg/dL (ref 7–22)
CO2: 29 mmol/L (ref 18–33)
Calcium: 9.9 mg/dL (ref 8.0–10.3)
Chloride: 103 mmol/L (ref 98–108)
Creatinine: 0.9 mg/dL (ref 0.60–1.20)
Glucose, Bld: 117 mg/dL (ref 73–118)
Potassium: 3.9 mmol/L (ref 3.3–4.7)
Sodium: 147 mmol/L — ABNORMAL HIGH (ref 128–145)
Total Bilirubin: 0.5 mg/dL (ref 0.2–1.6)
Total Protein: 7.8 g/dL (ref 6.4–8.1)

## 2018-04-06 LAB — CBC WITH DIFFERENTIAL (CANCER CENTER ONLY)
Abs Immature Granulocytes: 0.03 10*3/uL (ref 0.00–0.07)
Basophils Absolute: 0 10*3/uL (ref 0.0–0.1)
Basophils Relative: 0 %
Eosinophils Absolute: 0.1 10*3/uL (ref 0.0–0.5)
Eosinophils Relative: 3 %
HCT: 33.6 % — ABNORMAL LOW (ref 36.0–46.0)
Hemoglobin: 11.2 g/dL — ABNORMAL LOW (ref 12.0–15.0)
Immature Granulocytes: 1 %
Lymphocytes Relative: 9 %
Lymphs Abs: 0.5 10*3/uL — ABNORMAL LOW (ref 0.7–4.0)
MCH: 31.5 pg (ref 26.0–34.0)
MCHC: 33.3 g/dL (ref 30.0–36.0)
MCV: 94.4 fL (ref 80.0–100.0)
Monocytes Absolute: 0.5 10*3/uL (ref 0.1–1.0)
Monocytes Relative: 8 %
Neutro Abs: 4.4 10*3/uL (ref 1.7–7.7)
Neutrophils Relative %: 79 %
Platelet Count: 233 10*3/uL (ref 150–400)
RBC: 3.56 MIL/uL — ABNORMAL LOW (ref 3.87–5.11)
RDW: 12.8 % (ref 11.5–15.5)
WBC Count: 5.5 10*3/uL (ref 4.0–10.5)
nRBC: 0 % (ref 0.0–0.2)

## 2018-04-06 MED ORDER — DILTIAZEM HCL ER BEADS 300 MG PO CP24: 300.0000 mg | ORAL_CAPSULE | Freq: Every day | ORAL | 1 refills | Status: DC

## 2018-04-06 MED ORDER — SODIUM CHLORIDE 0.9 % IV SOLN
200.0000 mg | Freq: Once | INTRAVENOUS | Status: AC
  Administered 2018-04-06: 200 mg via INTRAVENOUS
  Filled 2018-04-06: qty 8

## 2018-04-06 MED ORDER — SODIUM CHLORIDE 0.9% FLUSH
10.0000 mL | INTRAVENOUS | Status: DC | PRN
  Administered 2018-04-06: 10 mL
  Filled 2018-04-06: qty 10

## 2018-04-06 MED ORDER — DRONABINOL 2.5 MG PO CAPS: ORAL_CAPSULE | ORAL | 0 refills | Status: DC

## 2018-04-06 MED ORDER — HEPARIN SOD (PORK) LOCK FLUSH 100 UNIT/ML IV SOLN
500.0000 [IU] | Freq: Once | INTRAVENOUS | Status: AC | PRN
  Administered 2018-04-06: 500 [IU]
  Filled 2018-04-06: qty 5

## 2018-04-06 MED ORDER — SODIUM CHLORIDE 0.9 % IV SOLN
Freq: Once | INTRAVENOUS | Status: AC
  Administered 2018-04-06: 14:00:00 via INTRAVENOUS
  Filled 2018-04-06: qty 250

## 2018-04-06 NOTE — Patient Instructions (Signed)
[facility] Discharge Instructions for Patients Receiving Chemotherapy  Today you received the following chemotherapy agents:  Kadcyla  To help prevent nausea and vomiting after your treatment, we encourage you to take your nausea medication as ordered per MD.    If you develop nausea and vomiting that is not controlled by your nausea medication, call the clinic.   BELOW ARE SYMPTOMS THAT SHOULD BE REPORTED IMMEDIATELY:  *FEVER GREATER THAN 100.5 F  *CHILLS WITH OR WITHOUT FEVER  NAUSEA AND VOMITING THAT IS NOT CONTROLLED WITH YOUR NAUSEA MEDICATION  *UNUSUAL SHORTNESS OF BREATH  *UNUSUAL BRUISING OR BLEEDING  TENDERNESS IN MOUTH AND THROAT WITH OR WITHOUT PRESENCE OF ULCERS  *URINARY PROBLEMS  *BOWEL PROBLEMS  UNUSUAL RASH Items with * indicate a potential emergency and should be followed up as soon as possible.  Feel free to call the clinic should you have any questions or concerns. The clinic phone number is [phone].  Please show the [place] at check-in to the Emergency Department and triage nurse.

## 2018-04-06 NOTE — Progress Notes (Signed)
Hematology and Oncology Follow Up Visit  Shannon Scott [id number] 1941-04-25 77 y.o. 04/06/2018   Principle Diagnosis:  Locally advanced/recurrent endometrial carcinoma undifferentiated --  TMB (HIGH) / MSI HIGH  Current Therapy:   Radiation therapy for 5 -5 1/2 weeks - s/p 20 fractions Taxol/carboplatinumq 7 days -- s/p cycle #6 Pembrolizumab 200 mg IV q 3 wk -- s/p cycle #1 IV iron as indicated - last received 11/10/2017   Interim History: Ms. Toohey is here today with her daughter for follow-up.  She is doing pretty well.  She may be having some issues with her colostomy.  I told her that the surgeon who made that should be able to help her out.  She had no problems with the first cycle of pembrolizumab.  She had no diarrhea.  There is been no fever.  She is had no rashes.  There is been no cough or shortness of breath.  There is been no leg swelling.  Of note, her Ca1 25 has come down incredibly well.  When we first saw her back in May, the CA 125 was 143.  Only last checked in October it was down to 8.  Overall, her performance status is ECOG 1.   Medications:  Allergies as of 04/06/2018      Reactions   Statins Other (See Comments)   Myalgias and memory problems   Ciprofloxacin Itching   Splotchy redness with itching during IV infusion localized to arm.      Medication List        Accurate as of 04/06/18 12:54 PM. Always use your most recent med list.          acetaminophen 500 MG tablet Commonly known as:  TYLENOL Take 500 mg by mouth every 6 (six) hours as needed for mild pain, moderate pain, fever or headache.   amoxicillin 500 MG tablet Commonly known as:  AMOXIL Take four tablets prior to dental procedure   belladonna-PHENObarbital 16.2 MG/5ML Elix Commonly known as:  DONNATAL Take 5 mLs (16.2 mg total) by mouth 4 (four) times daily.   diclofenac sodium 1 % Gel Commonly known as:  VOLTAREN Apply 2 g topically 4 (four) times daily.     diltiazem 300 MG 24 hr capsule Commonly known as:  TIAZAC Take 300 mg by mouth daily.   dronabinol 2.5 MG capsule Commonly known as:  MARINOL TAKE 1 CAPSULE BY MOUTH TWICE DAILY BEFORE LUNCH AND SUPPER   KEFLEX PO Take by mouth 2 (two) times daily.   lactose free nutrition Liqd Take 237 mLs by mouth 3 (three) times daily with meals.   levothyroxine 50 MCG tablet Commonly known as:  SYNTHROID, LEVOTHROID Take 1 tablet (50 mcg total) by mouth daily before breakfast.   lidocaine-prilocaine cream Commonly known as:  EMLA Apply 1 application topically as needed.   lipase/protease/amylase 36000 UNITS Cpep capsule Commonly known as:  CREON Take 2 capsules (72,000 Units total) by mouth 3 (three) times daily before meals.   loperamide 2 MG capsule Commonly known as:  IMODIUM Take 2 mg by mouth as needed for diarrhea or loose stools.   losartan 100 MG tablet Commonly known as:  COZAAR Take 100 mg by mouth daily.   mirtazapine 15 MG tablet Commonly known as:  REMERON Take 1 tablet (15 mg total) by mouth at bedtime.   ondansetron 8 MG tablet Commonly known as:  ZOFRAN Take 1 tablet (8 mg total) by mouth every 8 (eight) hours as needed for nausea  or vomiting.   pantoprazole 40 MG tablet Commonly known as:  PROTONIX Take 1 tablet (40 mg total) by mouth daily.   polyethylene glycol packet Commonly known as:  MIRALAX / GLYCOLAX Take 17 g by mouth daily.   prochlorperazine 10 MG tablet Commonly known as:  COMPAZINE Take 1 tablet (10 mg total) by mouth every 6 (six) hours as needed for nausea or vomiting.   senna-docusate 8.6-50 MG tablet Commonly known as:  Senokot-S Take 1 tablet by mouth 2 (two) times daily.   traMADol 50 MG tablet Commonly known as:  ULTRAM TAKE 1 TABLET(50 MG) BY MOUTH EVERY 6 HOURS AS NEEDED FOR MODERATE PAIN       Allergies:  Allergies  Allergen Reactions  . Statins Other (See Comments)    Myalgias and memory problems  . Ciprofloxacin  Itching    Splotchy redness with itching during IV infusion localized to arm.    Past Medical History, Surgical history, Social history, and Family History were reviewed and updated.  Review of Systems: Review of Systems  Constitutional: Positive for malaise/fatigue and weight loss.  HENT: Negative.   Eyes: Negative.   Respiratory: Positive for cough.   Cardiovascular: Negative.   Gastrointestinal: Positive for nausea.  Genitourinary: Positive for frequency.  Musculoskeletal: Positive for myalgias.  Skin: Negative.   Neurological: Negative.   Endo/Heme/Allergies: Negative.   Psychiatric/Behavioral: Negative.      Physical Exam:  weight is 145 lb 6 oz (65.9 kg). Her oral temperature is 98.6 F (37 C). Her blood pressure is 154/54 (abnormal) and her pulse is 75. Her respiration is 18 and oxygen saturation is 97%.   Wt Readings from Last 3 Encounters:  04/06/18 145 lb 6 oz (65.9 kg)  03/10/18 143 lb (64.9 kg)  03/02/18 141 lb 8 oz (64.2 kg)    Physical Exam  Constitutional: She is oriented to person, place, and time.  HENT:  Head: Normocephalic and atraumatic.  Mouth/Throat: Oropharynx is clear and moist.  Eyes: Pupils are equal, round, and reactive to light. EOM are normal.  Neck: Normal range of motion.  Cardiovascular: Normal rate, regular rhythm and normal heart sounds.  Pulmonary/Chest: Effort normal and breath sounds normal.  Abdominal: Soft. Bowel sounds are normal.  Musculoskeletal: Normal range of motion. She exhibits no edema, tenderness or deformity.  Lymphadenopathy:    She has no cervical adenopathy.  Neurological: She is alert and oriented to person, place, and time.  Skin: Skin is warm and dry. No rash noted. No erythema.  Psychiatric: She has a normal mood and affect. Her behavior is normal. Judgment and thought content normal.  Vitals reviewed.    Lab Results  Component Value Date   WBC 5.5 04/06/2018   HGB 11.2 (L) 04/06/2018   HCT 33.6 (L)  04/06/2018   MCV 94.4 04/06/2018   PLT 233 04/06/2018   Lab Results  Component Value Date   FERRITIN 616 (H) 03/10/2018   IRON 48 03/10/2018   TIBC 193 (L) 03/10/2018   UIBC 145 03/10/2018   IRONPCTSAT 25 03/10/2018   Lab Results  Component Value Date   RETICCTPCT 0.5 (L) 10/20/2017   RBC 3.56 (L) 04/06/2018   No results found for: KPAFRELGTCHN, LAMBDASER, KAPLAMBRATIO No results found for: IGGSERUM, IGA, IGMSERUM No results found for: TOTALPROTELP, ALBUMINELP, A1GS, A2GS, BETS, BETA2SER, GAMS, MSPIKE, SPEI   Chemistry      Component Value Date/Time   NA 147 (H) 04/06/2018 1211   NA 136 (A) 09/08/2017   K  3.9 04/06/2018 1211   CL 103 04/06/2018 1211   CO2 29 04/06/2018 1211   BUN 15 04/06/2018 1211   BUN 8 09/08/2017   CREATININE 0.90 04/06/2018 1211   CREATININE 0.69 08/02/2016 1519   GLU 111 09/08/2017      Component Value Date/Time   CALCIUM 9.9 04/06/2018 1211   ALKPHOS 112 (H) 04/06/2018 1211   AST 29 04/06/2018 1211   ALT 31 04/06/2018 1211   BILITOT 0.5 04/06/2018 1211      Impression and Plan: Ms. Antenucci is a very pleasant 77 yo caucasian female with recurrent undifferentiated endometrial carcinoma.   We will continue with the pembrolizumab.  I think that this should work nicely given that she has a very high TMB level with her cancer.  I will plan to get her back in another 4 weeks now.  3 weeks will be Thanksgiving.  We will continue to monitor her iron levels.    Volanda Napoleon, MD

## 2018-04-07 LAB — TSH: TSH: 3.846 u[IU]/mL (ref 0.308–3.960)

## 2018-04-07 LAB — CA 125: Cancer Antigen (CA) 125: 10.1 U/mL (ref 0.0–38.1)

## 2018-04-07 LAB — FERRITIN: Ferritin: 641 ng/mL — ABNORMAL HIGH (ref 11–307)

## 2018-04-07 LAB — IRON AND TIBC
Iron: 45 ug/dL (ref 41–142)
Saturation Ratios: 23 % (ref 21–57)
TIBC: 199 ug/dL — ABNORMAL LOW (ref 236–444)
UIBC: 154 ug/dL (ref 120–384)

## 2018-04-10 ENCOUNTER — Telehealth: Payer: Self-pay | Admitting: *Deleted

## 2018-04-10 NOTE — Telephone Encounter (Signed)
Message left from patient stating that she is having "swelling in legs" and would like to know what to do about it.  Call placed back to patient and patient denies any pain or redness to her legs.  She states that she has not been elevating legs at all. Instructed pt to elevate legs above her heart as much as possible and to call office back if swelling does not decrease.  Pt appreciative of call back and has no further questions or concerns at this time.

## 2018-04-10 NOTE — Telephone Encounter (Signed)
Received TC from patient requesting to speak with the "ostomy nurse"  Staff message sent to [facility] with pt information, and asked her to call pt. TCT patient and informed her of the above.

## 2018-04-12 ENCOUNTER — Other Ambulatory Visit: Payer: Self-pay | Admitting: Hematology & Oncology

## 2018-04-13 ENCOUNTER — Other Ambulatory Visit: Payer: Self-pay | Admitting: Family

## 2018-04-26 ENCOUNTER — Ambulatory Visit: Payer: Medicare Other

## 2018-04-26 ENCOUNTER — Other Ambulatory Visit: Payer: Medicare Other

## 2018-05-04 ENCOUNTER — Encounter: Payer: Self-pay | Admitting: Hematology & Oncology

## 2018-05-04 ENCOUNTER — Other Ambulatory Visit: Payer: Self-pay

## 2018-05-04 ENCOUNTER — Inpatient Hospital Stay: Payer: Medicare Other

## 2018-05-04 ENCOUNTER — Inpatient Hospital Stay: Payer: Medicare Other | Attending: Hematology & Oncology

## 2018-05-04 ENCOUNTER — Inpatient Hospital Stay (HOSPITAL_BASED_OUTPATIENT_CLINIC_OR_DEPARTMENT_OTHER): Payer: Medicare Other | Admitting: Hematology & Oncology

## 2018-05-04 VITALS — BP 164/58 | HR 77 | Temp 98.3°F | Resp 17 | Wt 146.0 lb

## 2018-05-04 DIAGNOSIS — Z5112 Encounter for antineoplastic immunotherapy: Secondary | ICD-10-CM | POA: Insufficient documentation

## 2018-05-04 DIAGNOSIS — C541 Malignant neoplasm of endometrium: Secondary | ICD-10-CM

## 2018-05-04 DIAGNOSIS — D508 Other iron deficiency anemias: Secondary | ICD-10-CM

## 2018-05-04 DIAGNOSIS — C539 Malignant neoplasm of cervix uteri, unspecified: Secondary | ICD-10-CM

## 2018-05-04 DIAGNOSIS — C799 Secondary malignant neoplasm of unspecified site: Principal | ICD-10-CM

## 2018-05-04 DIAGNOSIS — Z923 Personal history of irradiation: Secondary | ICD-10-CM | POA: Diagnosis not present

## 2018-05-04 DIAGNOSIS — R600 Localized edema: Secondary | ICD-10-CM | POA: Insufficient documentation

## 2018-05-04 DIAGNOSIS — D62 Acute posthemorrhagic anemia: Secondary | ICD-10-CM

## 2018-05-04 LAB — CMP (CANCER CENTER ONLY)
ALT: 10 U/L (ref 0–44)
AST: 14 U/L — ABNORMAL LOW (ref 15–41)
Albumin: 4 g/dL (ref 3.5–5.0)
Alkaline Phosphatase: 88 U/L (ref 38–126)
Anion gap: 5 (ref 5–15)
BUN: 14 mg/dL (ref 8–23)
CO2: 30 mmol/L (ref 22–32)
Calcium: 9.7 mg/dL (ref 8.9–10.3)
Chloride: 98 mmol/L (ref 98–111)
Creatinine: 0.83 mg/dL (ref 0.44–1.00)
GFR, Est AFR Am: >60 mL/min (ref >60)
GFR, Estimated: >60 mL/min (ref >60)
Glucose, Bld: 121 mg/dL — ABNORMAL HIGH (ref 70–99)
Potassium: 3.7 mmol/L (ref 3.5–5.1)
Sodium: 133 mmol/L — ABNORMAL LOW (ref 135–145)
Total Bilirubin: 0.4 mg/dL (ref 0.3–1.2)
Total Protein: 7.1 g/dL (ref 6.5–8.1)

## 2018-05-04 LAB — CBC WITH DIFFERENTIAL (CANCER CENTER ONLY)
Abs Immature Granulocytes: 0.01 10*3/uL (ref 0.00–0.07)
Basophils Absolute: 0 10*3/uL (ref 0.0–0.1)
Basophils Relative: 0 %
Eosinophils Absolute: 0.1 10*3/uL (ref 0.0–0.5)
Eosinophils Relative: 2 %
HCT: 35.6 % — ABNORMAL LOW (ref 36.0–46.0)
Hemoglobin: 11.6 g/dL — ABNORMAL LOW (ref 12.0–15.0)
Immature Granulocytes: 0 %
Lymphocytes Relative: 8 %
Lymphs Abs: 0.4 10*3/uL — ABNORMAL LOW (ref 0.7–4.0)
MCH: 31.3 pg (ref 26.0–34.0)
MCHC: 32.6 g/dL (ref 30.0–36.0)
MCV: 96 fL (ref 80.0–100.0)
Monocytes Absolute: 0.4 10*3/uL (ref 0.1–1.0)
Monocytes Relative: 7 %
Neutro Abs: 3.9 10*3/uL (ref 1.7–7.7)
Neutrophils Relative %: 83 %
Platelet Count: 217 10*3/uL (ref 150–400)
RBC: 3.71 MIL/uL — ABNORMAL LOW (ref 3.87–5.11)
RDW: 12.7 % (ref 11.5–15.5)
WBC Count: 4.7 10*3/uL (ref 4.0–10.5)
nRBC: 0 % (ref 0.0–0.2)

## 2018-05-04 MED ORDER — SODIUM CHLORIDE 0.9 % IV SOLN
200.0000 mg | Freq: Once | INTRAVENOUS | Status: AC
  Administered 2018-05-04: 200 mg via INTRAVENOUS
  Filled 2018-05-04: qty 8

## 2018-05-04 MED ORDER — HEPARIN SOD (PORK) LOCK FLUSH 100 UNIT/ML IV SOLN
500.0000 [IU] | Freq: Once | INTRAVENOUS | Status: AC | PRN
  Administered 2018-05-04: 500 [IU]
  Filled 2018-05-04: qty 5

## 2018-05-04 MED ORDER — SODIUM CHLORIDE 0.9% FLUSH
10.0000 mL | INTRAVENOUS | Status: DC | PRN
  Administered 2018-05-04: 10 mL
  Filled 2018-05-04: qty 10

## 2018-05-04 MED ORDER — SODIUM CHLORIDE 0.9 % IV SOLN
Freq: Once | INTRAVENOUS | Status: AC
  Administered 2018-05-04: 14:00:00 via INTRAVENOUS
  Filled 2018-05-04: qty 250

## 2018-05-04 NOTE — Progress Notes (Signed)
Hematology and Oncology Follow Up Visit  Shannon Scott [id number] April 29, 1941 77 y.o. 05/04/2018   Principle Diagnosis:  Locally advanced/recurrent endometrial carcinoma undifferentiated --  TMB (HIGH) / MSI HIGH  Current Therapy:   Radiation therapy for 5 -5 1/2 weeks - s/p 20 fractions Taxol/carboplatinumq 7 days -- s/p cycle #6 Pembrolizumab 200 mg IV q 3 wk -- s/p cycle #2 IV iron as indicated - last received 11/10/2017   Interim History: [name] is here today with her daughter for follow-up.  So far, she is done quite well with the immunotherapy.  She has not had any problems with nausea or vomiting.  She had a wonderful Thanksgiving.  She ate quite well.  Her colostomy is working quite nicely.  Her last CA 125 was 10.  We are checking her thyroid.  Her TSH back in October was 3.8.  She does not have any pain.  There is no bleeding.  She is has had no cough.  She has had no mouth sores.  There has been no rashes.  Her graph she does have some leg swelling.  At this is been chronic.  I suspect this probably is from lymphatic disruption from her abdominal surgery and radiation.    Overall, her performance status is ECOG 1.   Medications:  Allergies as of 05/04/2018      Reactions   Statins Other (See Comments)   Myalgias and memory problems   Ciprofloxacin Itching   Splotchy redness with itching during IV infusion localized to arm.      Medication List        Accurate as of 05/04/18  1:13 PM. Always use your most recent med list.          acetaminophen 500 MG tablet Commonly known as:  TYLENOL Take 500 mg by mouth every 6 (six) hours as needed for mild pain, moderate pain, fever or headache.   amoxicillin 500 MG tablet Commonly known as:  AMOXIL Take four tablets prior to dental procedure   belladonna-PHENObarbital 16.2 MG/5ML Elix Commonly known as:  DONNATAL Take 5 mLs (16.2 mg total) by mouth 4 (four) times daily.   diclofenac sodium 1 %  Gel Commonly known as:  VOLTAREN Apply 2 g topically 4 (four) times daily.   diltiazem 300 MG 24 hr capsule Commonly known as:  TIAZAC Take 1 capsule (300 mg total) by mouth daily.   dronabinol 2.5 MG capsule Commonly known as:  MARINOL TAKE 1 CAPSULE BY MOUTH TWICE DAILY BEFORE LUNCH AND SUPPER   KEFLEX PO Take by mouth 2 (two) times daily.   lactose free nutrition Liqd Take 237 mLs by mouth 3 (three) times daily with meals.   levothyroxine 50 MCG tablet Commonly known as:  SYNTHROID, LEVOTHROID Take 1 tablet (50 mcg total) by mouth daily before breakfast.   lidocaine-prilocaine cream Commonly known as:  EMLA Apply 1 application topically as needed.   lipase/protease/amylase 36000 UNITS Cpep capsule Commonly known as:  CREON Take 2 capsules (72,000 Units total) by mouth 3 (three) times daily before meals.   loperamide 2 MG capsule Commonly known as:  IMODIUM Take 2 mg by mouth as needed for diarrhea or loose stools.   losartan 100 MG tablet Commonly known as:  COZAAR Take 100 mg by mouth daily.   mirtazapine 15 MG tablet Commonly known as:  REMERON Take 1 tablet (15 mg total) by mouth at bedtime.   ondansetron 8 MG tablet Commonly known as:  ZOFRAN TAKE 1  TABLET(8 MG) BY MOUTH EVERY 8 HOURS AS NEEDED FOR NAUSEA OR VOMITING   pantoprazole 40 MG tablet Commonly known as:  PROTONIX Take 1 tablet (40 mg total) by mouth daily.   polyethylene glycol packet Commonly known as:  MIRALAX / GLYCOLAX Take 17 g by mouth daily.   prochlorperazine 10 MG tablet Commonly known as:  COMPAZINE Take 1 tablet (10 mg total) by mouth every 6 (six) hours as needed for nausea or vomiting.   senna-docusate 8.6-50 MG tablet Commonly known as:  Senokot-S Take 1 tablet by mouth 2 (two) times daily.   traMADol 50 MG tablet Commonly known as:  ULTRAM TAKE 1 TABLET(50 MG) BY MOUTH EVERY 6 HOURS AS NEEDED FOR MODERATE PAIN       Allergies:  Allergies  Allergen Reactions  .  Statins Other (See Comments)    Myalgias and memory problems  . Ciprofloxacin Itching    Splotchy redness with itching during IV infusion localized to arm.    Past Medical History, Surgical history, Social history, and Family History were reviewed and updated.  Review of Systems: Review of Systems  Constitutional: Positive for malaise/fatigue and weight loss.  HENT: Negative.   Eyes: Negative.   Respiratory: Positive for cough.   Cardiovascular: Negative.   Gastrointestinal: Positive for nausea.  Genitourinary: Positive for frequency.  Musculoskeletal: Positive for myalgias.  Skin: Negative.   Neurological: Negative.   Endo/Heme/Allergies: Negative.   Psychiatric/Behavioral: Negative.      Physical Exam:  weight is 146 lb (66.2 kg). Her oral temperature is 98.3 F (36.8 C). Her blood pressure is 164/58 (abnormal) and her pulse is 77. Her respiration is 17 and oxygen saturation is 98%.   Wt Readings from Last 3 Encounters:  05/04/18 146 lb (66.2 kg)  04/06/18 145 lb 6 oz (65.9 kg)  03/10/18 143 lb (64.9 kg)    Physical Exam  Constitutional: She is oriented to person, place, and time.  HENT:  Head: Normocephalic and atraumatic.  Mouth/Throat: Oropharynx is clear and moist.  Eyes: Pupils are equal, round, and reactive to light. EOM are normal.  Neck: Normal range of motion.  Cardiovascular: Normal rate, regular rhythm and normal heart sounds.  Pulmonary/Chest: Effort normal and breath sounds normal.  Abdominal: Soft. Bowel sounds are normal.  Musculoskeletal: Normal range of motion. She exhibits no edema, tenderness or deformity.  Lymphadenopathy:    She has no cervical adenopathy.  Neurological: She is alert and oriented to person, place, and time.  Skin: Skin is warm and dry. No rash noted. No erythema.  Psychiatric: She has a normal mood and affect. Her behavior is normal. Judgment and thought content normal.  Vitals reviewed.    Lab Results  Component Value  Date   WBC 4.7 05/04/2018   HGB 11.6 (L) 05/04/2018   HCT 35.6 (L) 05/04/2018   MCV 96.0 05/04/2018   PLT 217 05/04/2018   Lab Results  Component Value Date   FERRITIN 641 (H) 04/06/2018   IRON 45 04/06/2018   TIBC 199 (L) 04/06/2018   UIBC 154 04/06/2018   IRONPCTSAT 23 04/06/2018   Lab Results  Component Value Date   RETICCTPCT 0.5 (L) 10/20/2017   RBC 3.71 (L) 05/04/2018   No results found for: KPAFRELGTCHN, LAMBDASER, KAPLAMBRATIO No results found for: IGGSERUM, IGA, IGMSERUM No results found for: TOTALPROTELP, ALBUMINELP, A1GS, A2GS, BETS, BETA2SER, GAMS, MSPIKE, SPEI   Chemistry      Component Value Date/Time   NA 133 (L) 05/04/2018 1208  NA 136 (A) 09/08/2017   K 3.7 05/04/2018 1208   CL 98 05/04/2018 1208   CO2 30 05/04/2018 1208   BUN 14 05/04/2018 1208   BUN 8 09/08/2017   CREATININE 0.83 05/04/2018 1208   CREATININE 0.69 08/02/2016 1519   GLU 111 09/08/2017      Component Value Date/Time   CALCIUM 9.7 05/04/2018 1208   ALKPHOS 88 05/04/2018 1208   AST 14 (L) 05/04/2018 1208   ALT 10 05/04/2018 1208   BILITOT 0.4 05/04/2018 1208      Impression and Plan: Ms. Holstein is a very pleasant 77 yo caucasian female with recurrent undifferentiated endometrial carcinoma.   We will continue with the pembrolizumab.  This will be her third cycle of pembrolizumab.  I think that this should work nicely given that she has a very high TMB level with her cancer.  I will plan for another PET scan.  I would like to see how everything looks on the PET scan.  We will do the PET scan in 2-3 weeks.  I will plan to get her back in 3 weeks for her fourth cycle of treatment.    Volanda Napoleon, MD

## 2018-05-04 NOTE — Addendum Note (Signed)
Addended by: Burney Gauze R on: 05/04/2018 01:32 PM   Modules accepted: Orders

## 2018-05-04 NOTE — Patient Instructions (Signed)
[facility] Discharge Instructions for Patients Receiving Chemotherapy  Today you received the following chemotherapy agents: Keytruda   To help prevent nausea and vomiting after your treatment, we encourage you to take your nausea medication as directed    If you develop nausea and vomiting that is not controlled by your nausea medication, call the clinic.   BELOW ARE SYMPTOMS THAT SHOULD BE REPORTED IMMEDIATELY:  *FEVER GREATER THAN 100.5 F  *CHILLS WITH OR WITHOUT FEVER  NAUSEA AND VOMITING THAT IS NOT CONTROLLED WITH YOUR NAUSEA MEDICATION  *UNUSUAL SHORTNESS OF BREATH  *UNUSUAL BRUISING OR BLEEDING  TENDERNESS IN MOUTH AND THROAT WITH OR WITHOUT PRESENCE OF ULCERS  *URINARY PROBLEMS  *BOWEL PROBLEMS  UNUSUAL RASH Items with * indicate a potential emergency and should be followed up as soon as possible.  Feel free to call the clinic you have any questions or concerns. The clinic phone number is [phone].  Please show the CHEMO ALERT CARD at check-in to the Emergency Department and triage nurse.   

## 2018-05-05 LAB — IRON AND TIBC
Iron: 41 ug/dL (ref 41–142)
Saturation Ratios: 21 % (ref 21–57)
TIBC: 199 ug/dL — ABNORMAL LOW (ref 236–444)
UIBC: 157 ug/dL (ref 120–384)

## 2018-05-05 LAB — CA 125: Cancer Antigen (CA) 125: 11.2 U/mL (ref 0.0–38.1)

## 2018-05-05 LAB — FERRITIN: Ferritin: 583 ng/mL — ABNORMAL HIGH (ref 11–307)

## 2018-05-08 ENCOUNTER — Other Ambulatory Visit: Payer: Self-pay | Admitting: Hematology & Oncology

## 2018-05-12 ENCOUNTER — Telehealth: Payer: Self-pay | Admitting: *Deleted

## 2018-05-12 NOTE — Telephone Encounter (Signed)
Voicemail receive from "Shannon Scott requesting to talk with someone about leg swelling."  Shannon Scott reports "having [name] card.  "I believe I did dial [phone]."  Call transferred.

## 2018-05-13 ENCOUNTER — Other Ambulatory Visit: Payer: Self-pay | Admitting: Hematology & Oncology

## 2018-05-15 ENCOUNTER — Telehealth: Payer: Self-pay | Admitting: *Deleted

## 2018-05-15 NOTE — Telephone Encounter (Signed)
Returned patient's phone call regarding leg swelling. Left message on home answering machine to call the office back.

## 2018-05-15 NOTE — Telephone Encounter (Signed)
Patient returned phone call to office regarding leg swelling. Patient stated,"my whole left leg is swollen, but the bottom of my foot feels numb at times. This leg swelling is not a new problem. I instructed her to keep the legs elevated as much as she can, and taker her shoes off. She verbalized understanding.

## 2018-05-17 NOTE — Progress Notes (Deleted)
GYN ONCOLOGY OFFICE VISIT    Shannon Scott 77 y.o. female  CC: Persistent metastatic endometrial cancer.    Assessment/Plan:  [name]  is a 77 y.o.  year old with Stage IA grade 2 endometrioid adenocarcinoma.  Status post surgical staging  12/30/2015. She only had 30% depth of myometrial invasion, no lymphovascular space involvement, and low risk factors for recurrence therefore adjuvant therapy not recommended in accordance to NCCN guidelines.   In March 2019 she presented with a bowel perforation and distal rectosigmoid obstruction.  Is also evidence of left ureteral compression.  She underwent a transverse diverting colostomy and subsequent placement of ureteral stent.  She is now completed external beam radiotherapy in addition to Taxol and carboplatin for 6 cycles.  Cycles administered in August 2019.  Subsequent imaging is notable for reduction in the size of the left pelvic sidewall lesion.  Ca1 25 was normalized  Dr. Marin Scott has referred the patient and her family to discuss the surgical options.  There is residual mass, hypermetabolic, within the pelvis along the sidewall in a previously radiated area.  Given the location of this lesion, our inability to obtain negative margins and her comorbidities, a surgical approach is not prudent .  Molecular assessment of the tumor indicates that this is an MSI high tumor that is ER positive.  The treatment options include pembro vs hormonal therapy with evirolimus in combination with letrozole.   My recommendation is for administration of immunotherapy.  This recommendation was discussed with Dr. Marin Scott.   Session was held with the patient and her children lasting approximately 15 minutes.  Follow-up with Dr. Marin Scott Follow-up with Gyn Oncology in 3 months.    HPI: Shannon Scott is a 77 y.o.  old G2P2 who was initially seen in our practice by Dr. Alycia Scott or grade 1-2 endometrial cancer in 2017.  The patient began  noticing postmenopausal bleeding in June, 2017. She saw her primary care physician who determined there was no blood in the urine, and she was then referred to Dr Shannon Scott who, on 11/21/15 performed a TVUS which showed a uterus measuring 6x2.7x3.3cm with normal ovaries. There was a thickened endometrium of 6.42m. A pipelle endometrial biopsy was performed on 11/21/15 which showed FIGO grade 1-2 endometrial cancer.  The pap from the same date was normal.  The patient has a history of familial hypercholesterolemia. She has a history of a 3 vessel CABG in 2005. She has never had a CVA, and recent carotid dopplers show 50% occlusion. She reports symptoms concerning for LE claudication with ambulation and stairs. .  On 12/30/15 she underwent robotic assisted total hysterectomy, BSO, pelvic and PA SLN biopsy. 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  Postoperative Diagnosis: same.  Surgery: Total robotic hysterectomy bilateral salpingo-oophorectomy, left pelvic, right para-aortic and bifurcation SLN removal  Operative findings:  1) Colon adherent to posterior uterus, left sidewall and appendix 2) 2 right para-aortic nodes for SLN (high and PA node) 3) Aortic bifurcation nodes 4) Left obturator node  Diagnosis 1. Lymph node, sentinel, biopsy, right obturator - ONE BENIGN LYMPH NODE (0/1). 2. Lymph node, sentinel, biopsy, right peri-aortic - ONE BENIGN LYMPH NODE (0/1). 3. Lymph node, sentinel, biopsy, right lower peri-aortic - ONE BENIGN LYMPH NODE (0/1). 4. Lymph node, sentinel, biopsy, aortic bifurcation - ONE BENIGN LYMPH NODE (0/1). 5. Lymph node, sentinel, biopsy, left obturator - ONE BENIGN LYMPH NODE (0/1). 6. Uterus +/- tubes/ovaries, neoplastic - ENDOMETRIAL ADENOCARCINOMA, 1.7 CM WITH  SUPERFICIAL MYOMETRIAL INVASION. - MARGINS NOT INVOLVED. - CERVIX, BILATERAL OVARIES AND BILATERAL FALLOPIAN TUBES FREE OF TUMOR. Microscopic Comment 6. ONCOLOGY  TABLE-UTERUS, CARCINOMA OR CARCINOSARCOMA Specimen: Uterus with bilateral fallopian tubes and ovaries and sentinel lymph node biopsies. Procedure: Hysterectomy with sentinel lymph nodes. Lymph node sampling performed: Yes. Specimen integrity: Intact. Maximum tumor size: 1.7 cm Histologic type: Endometrioid with squamous differentiation. Grade: 2 Myometrial invasion: 0.3 cm where myometrium is 1 cm in thickness Cervical stromal involvement: No. Extent of involvement of other organs: None, identified. Lymph - vascular invasion: Not identified.  Interval History: 05/03/16 CT guided drainage: IMPRESSION: Successful CT guided aspiration of approximately 15 cc of serous, slightly cloudy / milky fluid from the residual collection within the left hemipelvis. All aspirated samples were sent to the laboratory for cytologic and gram stain analysis.  Cytology was negative.  Shannon Scott in February 2019 presented to her primary physician with complaints of rectal bleeding.  She was scheduled for a colonoscopy and had taken the bowel prep when she presented with obstruction of her distal sigmoid colon.  Imaging 08/18/2017 The left ureter is dilated to the left hemipelvis. It becomes narrowed in the inflammatory pelvic mass. This is described below. Bladder is within normal limits.  Stomach/Bowel: Large hiatal hernia is unchanged. Stable prominent gastric folds within the hiatal hernia.  The colon is diffusely distended. Distal small bowel is distended. A transition point between dilated large bowel and decompressed large bowel occurs in the sigmoid colon. There is a heterogeneous ill-defined mass involving the sigmoid colon and left side of the pelvis which includes the colon wall, adjacent fat, and both fluid and gas elements. Findings are suspicious for a focal perforation with abscess formation. Underlying malignancy is a strong consideration given the appearance and colon obstruction. The  mass also involves the left ureter causing an element of left ureteral dilatation worrisome for an element of left ureteral obstruction. The gas and fluid collection is very small measuring 1.8 x 1.2 cm on  image 109 of series 4. Overall mass size including the involved colon is 5.7 x 5.3 cm.  On August 21, 2017 she underwent a diverting laparoscopic transverse loop colostomy and subsequent placement of a left ureteral stent.  He then received pelvic external beam radiation therapy followed by 6 cycles of Taxol and carboplatin.  The last treatment was administered in August 2019. CT of the abdomen and pelvis on January 18, 2018 is notable for the presence of an IVC filter and 11 mm left internal iliac node that was previously 1.6 cm and a soft tissue mass in the left pelvic sidewall and now measuring 3.9 x 2.5 cm.  PET imaging February 28, 2018 demonstrates a hypermetabolic left pelvic sidewall mass, greater than 3 weeks after completion of radiotherapy.  Shannon Scott and her family present today for discussion regarding treatment options  Review of Systems  Constitutional:  Denies fever. Skin: No rash Cardiovascular: No chest pain, shortness of breath, or edema  Pulmonary: No cough  Gastro Intestinal: Reporting intermittent bulging at the site of the ostomy, with infrequent feeling of fullness in the area.  No nausea, vomiting, constipation, or diarrhea reported.  Reports passage of gas from the rectum, reports left-sided pelvic discomfort Genitourinary: Denies vaginal bleeding and discharge.  Musculoskeletal: No joint pain.  Neurologic: No weakness Psychology: A bit frustrated  Current Meds:  Outpatient Encounter Medications as of 05/18/2018  Medication Sig  . acetaminophen (TYLENOL) 500 MG tablet Take 500 mg by  mouth every 6 (six) hours as needed for mild pain, moderate pain, fever or headache.  Marland Kitchen amoxicillin (AMOXIL) 500 MG tablet Take four tablets prior to dental procedure  .  belladonna-PHENObarbital (DONNATAL) 16.2 MG/5ML ELIX Take 5 mLs (16.2 mg total) by mouth 4 (four) times daily.  . Cephalexin (KEFLEX PO) Take by mouth 2 (two) times daily.  . diclofenac sodium (VOLTAREN) 1 % GEL Apply 2 g topically 4 (four) times daily.  Marland Kitchen diltiazem (TIAZAC) 300 MG 24 hr capsule Take 1 capsule (300 mg total) by mouth daily.  Marland Kitchen dronabinol (MARINOL) 2.5 MG capsule TAKE 1 CAPSULE BY MOUTH TWICE DAILY BEFORE LUNCH AND SUPPER  . lactose free nutrition (BOOST PLUS) LIQD Take 237 mLs by mouth 3 (three) times daily with meals.  Marland Kitchen levothyroxine (SYNTHROID, LEVOTHROID) 50 MCG tablet Take 1 tablet (50 mcg total) by mouth daily before breakfast.  . lidocaine-prilocaine (EMLA) cream Apply 1 application topically as needed.  . lipase/protease/amylase (CREON) 36000 UNITS CPEP capsule Take 2 capsules (72,000 Units total) by mouth 3 (three) times daily before meals.  Marland Kitchen loperamide (IMODIUM) 2 MG capsule Take 2 mg by mouth as needed for diarrhea or loose stools.   Marland Kitchen losartan (COZAAR) 100 MG tablet Take 100 mg by mouth daily.  . mirtazapine (REMERON) 15 MG tablet Take 1 tablet (15 mg total) by mouth at bedtime.  . ondansetron (ZOFRAN) 8 MG tablet TAKE 1 TABLET(8 MG) BY MOUTH EVERY 8 HOURS AS NEEDED FOR NAUSEA OR VOMITING  . pantoprazole (PROTONIX) 40 MG tablet Take 1 tablet (40 mg total) by mouth daily.  . polyethylene glycol (MIRALAX / GLYCOLAX) packet Take 17 g by mouth daily.  . prochlorperazine (COMPAZINE) 10 MG tablet Take 1 tablet (10 mg total) by mouth every 6 (six) hours as needed for nausea or vomiting.  . senna-docusate (SENOKOT-S) 8.6-50 MG tablet Take 1 tablet by mouth 2 (two) times daily.  . traMADol (ULTRAM) 50 MG tablet TAKE 1 TABLET(50 MG) BY MOUTH EVERY 6 HOURS AS NEEDED FOR MODERATE PAIN   Facility-Administered Encounter Medications as of 05/18/2018  Medication  . heparin lock flush 100 unit/mL  . sodium chloride flush (NS) 0.9 % injection 10 mL    Allergy:  Allergies   Allergen Reactions  . Statins Other (See Comments)    Myalgias and memory problems  . Ciprofloxacin Itching    Splotchy redness with itching during IV infusion localized to arm.    Social Hx:   Social History   Socioeconomic History  . Marital status: Widowed    Spouse name: Not on file  . Number of children: 2  . Years of education: Not on file  . Highest education level: Not on file  Occupational History    Employer: RETIRED  Social Needs  . Financial resource strain: Not on file  . Food insecurity:    Worry: Not on file    Inability: Not on file  . Transportation needs:    Medical: Not on file    Non-medical: Not on file  Tobacco Use  . Smoking status: Never Smoker  . Smokeless tobacco: Never Used  Substance and Sexual Activity  . Alcohol use: No  . Drug use: No  . Sexual activity: Not on file  Lifestyle  . Physical activity:    Days per week: Not on file    Minutes per session: Not on file  . Stress: Not on file  Relationships  . Social connections:    Talks on phone: Not on  file    Gets together: Not on file    Attends religious service: Not on file    Active member of club or organization: Not on file    Attends meetings of clubs or organizations: Not on file    Relationship status: Not on file  . Intimate partner violence:    Fear of current or ex partner: Not on file    Emotionally abused: Not on file    Physically abused: Not on file    Forced sexual activity: Not on file  Other Topics Concern  . Not on file  Social History Narrative  . Not on file    Past Surgical Hx:  Past Surgical History:  Procedure Laterality Date  . ABDOMINAL HYSTERECTOMY    . Carotid Doppler  02/2012   40-59% right int carotid artery stenosis; 60-79% L int carotid stenosis; L carotid bruit  . CORONARY ARTERY BYPASS GRAFT  03/12/2004   LIMA to LAD, SVG to circumflex, SVG to PDA  . CYSTOSCOPY W/ URETERAL STENT PLACEMENT Left 12/06/2017   Procedure: CYSTOSCOPY WITH LEFT  URETERAL STENT EXCHANGE;  Surgeon: Irine Seal, MD;  Location: [facility];  Service: Urology;  Laterality: Left;  . CYSTOSCOPY WITH STENT PLACEMENT Bilateral 08/21/2017   Procedure: CYSTOSCOPY WITH STENT PLACEMENT;  Surgeon: Ceasar Mons, MD;  Location: [facility];  Service: Urology;  Laterality: Bilateral;  . IR FLUORO GUIDE PORT INSERTION RIGHT  10/11/2017  . IR GENERIC HISTORICAL  04/29/2016   IR RADIOLOGIST EVAL & MGMT 04/29/2016 Sandi Mariscal, MD [facility]  . IR GENERIC HISTORICAL  05/12/2016   IR RADIOLOGIST EVAL & MGMT 05/12/2016 Sandi Mariscal, MD [facility]  . IR IVC FILTER PLMT / S&I /IMG GUID/MOD SED  10/11/2017  . IR US GUIDE VASC ACCESS RIGHT  10/11/2017  . ROBOTIC ASSISTED TOTAL HYSTERECTOMY WITH BILATERAL SALPINGO OOPHERECTOMY Bilateral 12/30/2015   Procedure: XI ROBOTIC ASSISTED TOTAL HYSTERECTOMY WITH BILATERAL SALPINGO OOPHORECTOMY WITH SENTAL LYMPH NODE BIOPSY;  Surgeon: Nancy Marus, MD;  Location: [facility];  Service: Gynecology;  Laterality: Bilateral;  . TONSILLECTOMY    . TRANSTHORACIC ECHOCARDIOGRAM  04/2007   EF>55%; mild MR; mild-mod TR; mild pulm HTN; mild calcification of aortiv valve leaflets with mild valvular aortic stenosis  . TUBAL LIGATION      Past Medical Hx:  Past Medical History:  Diagnosis Date  . Arthritis   . Asthma    allergy induced  . Bilateral carotid artery disease ([facility])    L carotid bruit  . Bursitis    left hip  . CAD (coronary artery disease)   . Cancer ([place])   . Dyslipidemia    intolerant to statins, welchol, niacin, zetia  . Goals of care, counseling/discussion 10/11/2017  . History of blood transfusion   . History of nuclear stress test 04/24/2012   lexiscan; normal study  . Hypertension   . Hypothyroidism   . 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 ([place]) 10/11/2017  . Postoperative nausea and vomiting 01/02/2016    Past Gynecological History:  See HPI  No LMP recorded. Patient has  had a hysterectomy.  Family Hx:  Family History  Problem Relation Age of Onset  . Hypertension Mother   . Heart disease Mother        Died in her 52s  . Stroke Father   . Kidney disease Brother   . Heart disease Brother        also HTN, hyperlipidemia  . Heart  attack Brother   . Stroke Sister        x2  . Hypertension Sister     Vitals:  There were no vitals taken for this visit. BMI 28kg/m2  Physical Exam: WD in NAD

## 2018-05-18 ENCOUNTER — Inpatient Hospital Stay: Payer: Medicare Other | Admitting: Gynecologic Oncology

## 2018-05-18 ENCOUNTER — Encounter: Payer: Self-pay | Admitting: Gynecologic Oncology

## 2018-05-18 ENCOUNTER — Inpatient Hospital Stay (HOSPITAL_BASED_OUTPATIENT_CLINIC_OR_DEPARTMENT_OTHER): Payer: Medicare Other | Admitting: Gynecologic Oncology

## 2018-05-18 VITALS — BP 130/66 | HR 72 | Temp 98.3°F | Resp 18 | Ht 61.0 in | Wt 149.0 lb

## 2018-05-18 DIAGNOSIS — R600 Localized edema: Secondary | ICD-10-CM | POA: Diagnosis not present

## 2018-05-18 DIAGNOSIS — Z923 Personal history of irradiation: Secondary | ICD-10-CM | POA: Diagnosis not present

## 2018-05-18 DIAGNOSIS — C541 Malignant neoplasm of endometrium: Secondary | ICD-10-CM | POA: Diagnosis not present

## 2018-05-18 DIAGNOSIS — Z5112 Encounter for antineoplastic immunotherapy: Secondary | ICD-10-CM | POA: Diagnosis not present

## 2018-05-18 NOTE — Progress Notes (Signed)
GYN ONCOLOGY OFFICE VISIT    Shannon Scott 77 y.o. female  CC: Recurrent endometrial cancer.    Assessment/Plan:  [name]  is a 78 y.o.  year old with Stage IA grade 2 endometrioid adenocarcinoma.  Status post surgical staging  12/30/2015. She only had 30% depth of myometrial invasion, no lymphovascular space involvement, and low risk factors for recurrence therefore adjuvant therapy not recommended in accordance to NCCN guidelines.   In March 2019 she presented with a bowel perforation and distal rectosigmoid obstruction.  Is also evidence of left ureteral compression.  She underwent a transverse diverting colostomy and subsequent placement of ureteral stent.  She is now completed external beam radiotherapy in addition to Taxol and carboplatin for 6 cycles.  Cycles administered in August 2019.  Subsequent imaging is notable for reduction in the size of the left pelvic sidewall lesion.  Ca1 25 was normalized  Molecular assessment of the tumor indicates that this is an MSI high tumor that is ER positive.  She has completed 3 cycles of immunotherapy pembrolizumab.  Cycle 4 scheduled for next week after which imaging will be obtained to assess disease status.  This is been tolerated very well.    Parastomal hernia The colostomy is functioning well however there are difficulties with application of the appliance with resulting skin breakdown.  Will refer to wound care or to Shannon Scott surgical team.  Left lower extremity edema Shannon Scott has noted edema for the last week.  Will obtain Doppler studies to rule out DVT  Follow-up with Dr. Marin Olp Follow-up with Gyn Oncology in 3 months.    HPI: Shannon Scott is a 77 y.o.  old G2P2 who was initially seen in our practice by Dr. Alycia Scott or grade 1-2 endometrial cancer in 2017.  The patient began noticing postmenopausal bleeding in June, 2017. She saw her primary care physician who determined there was no blood in the  urine, and she was then referred to Dr Shannon Scott who, on 11/21/15 performed a TVUS which showed a uterus measuring 6x2.7x3.3cm with normal ovaries. There was a thickened endometrium of 6.43m. A pipelle endometrial biopsy was performed on 11/21/15 which showed FIGO grade 1-2 endometrial cancer.  The pap from the same date was normal.  The patient has a history of familial hypercholesterolemia. She has a history of a 3 vessel CABG in 2005. She has never had a CVA, and recent carotid dopplers show 50% occlusion. She reports symptoms concerning for LE claudication with ambulation and stairs. .  On 12/30/15 she underwent robotic assisted total hysterectomy, BSO, pelvic and PA SLN biopsy. 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  Postoperative Diagnosis: same.  Surgery: Total robotic hysterectomy bilateral salpingo-oophorectomy, left pelvic, right para-aortic and bifurcation SLN removal  Operative findings:  1) Colon adherent to posterior uterus, left sidewall and appendix 2) 2 right para-aortic nodes for SLN (high and PA node) 3) Aortic bifurcation nodes 4) Left obturator node  Diagnosis 1. Lymph node, sentinel, biopsy, right obturator - ONE BENIGN LYMPH NODE (0/1). 2. Lymph node, sentinel, biopsy, right peri-aortic - ONE BENIGN LYMPH NODE (0/1). 3. Lymph node, sentinel, biopsy, right lower peri-aortic - ONE BENIGN LYMPH NODE (0/1). 4. Lymph node, sentinel, biopsy, aortic bifurcation - ONE BENIGN LYMPH NODE (0/1). 5. Lymph node, sentinel, biopsy, left obturator - ONE BENIGN LYMPH NODE (0/1). 6. Uterus +/- tubes/ovaries, neoplastic - ENDOMETRIAL ADENOCARCINOMA, 1.7 CM WITH SUPERFICIAL MYOMETRIAL INVASION. - MARGINS NOT INVOLVED. - CERVIX, BILATERAL OVARIES  AND BILATERAL FALLOPIAN TUBES FREE OF TUMOR. Microscopic Comment 6. ONCOLOGY TABLE-UTERUS, CARCINOMA OR CARCINOSARCOMA Specimen: Uterus with bilateral fallopian tubes and ovaries and sentinel lymph node  biopsies. Procedure: Hysterectomy with sentinel lymph nodes. Lymph node sampling performed: Yes. Specimen integrity: Intact. Maximum tumor size: 1.7 cm Histologic type: Endometrioid with squamous differentiation. Grade: 2 Myometrial invasion: 0.3 cm where myometrium is 1 cm in thickness Cervical stromal involvement: No. Extent of involvement of other organs: None, identified. Lymph - vascular invasion: Not identified.  Interval History: 05/03/16 CT guided drainage: IMPRESSION: Successful CT guided aspiration of approximately 15 cc of serous, slightly cloudy / milky fluid from the residual collection within the left hemipelvis. All aspirated samples were sent to the laboratory for cytologic and gram stain analysis.  Cytology was negative.  Shannon Scott in February 2019 presented to her primary physician with complaints of rectal bleeding.  She was scheduled for a colonoscopy and had taken the bowel prep when she presented with obstruction of her distal sigmoid colon.  Imaging 08/18/2017 The left ureter is dilated to the left hemipelvis. It becomes narrowed in the inflammatory pelvic mass. This is described below. Bladder is within normal limits.  Stomach/Bowel: Large hiatal hernia is unchanged. Stable prominent gastric folds within the hiatal hernia.  The colon is diffusely distended. Distal small bowel is distended. A transition point between dilated large bowel and decompressed large bowel occurs in the sigmoid colon. There is a heterogeneous ill-defined mass involving the sigmoid colon and left side of the pelvis which includes the colon wall, adjacent fat, and both fluid and gas elements. Findings are suspicious for a focal perforation with abscess formation. Underlying malignancy is a strong consideration given the appearance and colon obstruction. The mass also involves the left ureter causing an element of left ureteral dilatation worrisome for an element of left ureteral  obstruction. The gas and fluid collection is very small measuring 1.8 x 1.2 cm on  image 109 of series 4. Overall mass size including the involved colon is 5.7 x 5.3 cm.  On August 21, 2017 she underwent a diverting laparoscopic transverse loop colostomy and subsequent placement of a left ureteral stent.  He then received pelvic external beam radiation therapy followed by 6 cycles of Taxol and carboplatin.  The last treatment was administered in August 2019. CT of the abdomen and pelvis on January 18, 2018 is notable for the presence of an IVC filter and 11 mm left internal iliac node that was previously 1.6 cm and a soft tissue mass in the left pelvic sidewall and now measuring 3.9 x 2.5 cm.  PET imaging February 28, 2018 demonstrates a hypermetabolic left pelvic sidewall mass, greater than 3 weeks after completion of radiotherapy. She is receiving single agent pembrolizumab lab cycle 4 scheduled for next week At this visit she states that the agent is very well-tolerated she notices dry red spots over bilateral upper extremities, and complains of abrasion at the site of application of the colostomy bag  Review of Systems  Constitutional:  Denies fever. Skin: Fine macular nonblanching patches over the upper extremity  cardiovascular: No chest pain, shortness of breath, or edema  Pulmonary: No cough  Gastro Intestinal: Reporting stable parastomal hernia, with infrequent feeling of fullness in the area.  No nausea, vomiting, constipation, or diarrhea reported.  Reports passage of gas from the rectum, reports  erosion at the site of the ostomy rt Genitourinary: Denies vaginal bleeding and discharge.  Musculoskeletal: No joint pain.  Reports swelling  of the left lower extremity for approximately 1 week Neurologic: No weakness    Allergy:  Allergies  Allergen Reactions  . Statins Other (See Comments)    Myalgias and memory problems  . Ciprofloxacin Itching    Splotchy redness with itching during  IV infusion localized to arm.    Social Hx:   Social History   Socioeconomic History  . Marital status: Widowed    Spouse name: Not on file  . Number of children: 2  . Years of education: Not on file  . Highest education level: Not on file  Occupational History    Employer: RETIRED  Social Needs  . Financial resource strain: Not on file  . Food insecurity:    Worry: Not on file    Inability: Not on file  . Transportation needs:    Medical: Not on file    Non-medical: Not on file  Tobacco Use  . Smoking status: Never Smoker  . Smokeless tobacco: Never Used  Substance and Sexual Activity  . Alcohol use: No  . Drug use: No  . Sexual activity: Not on file  Lifestyle  . Physical activity:    Days per week: Not on file    Minutes per session: Not on file  . Stress: Not on file  Relationships  . Social connections:    Talks on phone: Not on file    Gets together: Not on file    Attends religious service: Not on file    Active member of club or organization: Not on file    Attends meetings of clubs or organizations: Not on file    Relationship status: Not on file  . Intimate partner violence:    Fear of current or ex partner: Not on file    Emotionally abused: Not on file    Physically abused: Not on file    Forced sexual activity: Not on file  Other Topics Concern  . Not on file  Social History Narrative  . Not on file    Past Surgical Hx:  Past Surgical History:  Procedure Laterality Date  . ABDOMINAL HYSTERECTOMY    . Carotid Doppler  02/2012   40-59% right int carotid artery stenosis; 60-79% L int carotid stenosis; L carotid bruit  . CORONARY ARTERY BYPASS GRAFT  03/12/2004   LIMA to LAD, SVG to circumflex, SVG to PDA  . CYSTOSCOPY W/ URETERAL STENT PLACEMENT Left 12/06/2017   Procedure: CYSTOSCOPY WITH LEFT URETERAL STENT EXCHANGE;  Surgeon: Irine Seal, MD;  Location: [facility];  Service: Urology;  Laterality: Left;  . CYSTOSCOPY WITH STENT PLACEMENT Bilateral  08/21/2017   Procedure: CYSTOSCOPY WITH STENT PLACEMENT;  Surgeon: Ceasar Mons, MD;  Location: [facility];  Service: Urology;  Laterality: Bilateral;  . IR FLUORO GUIDE PORT INSERTION RIGHT  10/11/2017  . IR GENERIC HISTORICAL  04/29/2016   IR RADIOLOGIST EVAL & MGMT 04/29/2016 Sandi Mariscal, MD [facility]  . IR GENERIC HISTORICAL  05/12/2016   IR RADIOLOGIST EVAL & MGMT 05/12/2016 Sandi Mariscal, MD [facility]  . IR IVC FILTER PLMT / S&I /IMG GUID/MOD SED  10/11/2017  . IR US GUIDE VASC ACCESS RIGHT  10/11/2017  . ROBOTIC ASSISTED TOTAL HYSTERECTOMY WITH BILATERAL SALPINGO OOPHERECTOMY Bilateral 12/30/2015   Procedure: XI ROBOTIC ASSISTED TOTAL HYSTERECTOMY WITH BILATERAL SALPINGO OOPHORECTOMY WITH SENTAL LYMPH NODE BIOPSY;  Surgeon: Nancy Marus, MD;  Location: [facility];  Service: Gynecology;  Laterality: Bilateral;  . TONSILLECTOMY    . TRANSTHORACIC ECHOCARDIOGRAM  04/2007   EF>55%; mild MR; mild-mod TR; mild pulm HTN; mild calcification of aortiv valve leaflets with mild valvular aortic stenosis  . TUBAL LIGATION      Past Medical Hx:  Past Medical History:  Diagnosis Date  . Arthritis   . Asthma    allergy induced  . Bilateral carotid artery disease (HCC)    L carotid bruit  . Bursitis    left hip  . CAD (coronary artery disease)   . Cancer ([place])   . Dyslipidemia    intolerant to statins, welchol, niacin, zetia  . Goals of care, counseling/discussion 10/11/2017  . History of blood transfusion   . History of nuclear stress test 04/24/2012   lexiscan; normal study  . Hypertension   . Hypothyroidism   . 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 ([place]) 10/11/2017  . Postoperative nausea and vomiting 01/02/2016    Past Gynecological History:  See HPI  No LMP recorded. Patient has had a hysterectomy.  Family Hx:  Family History  Problem Relation Age of Onset  . Hypertension Mother   . Heart disease Mother        Died in her 58s   . Stroke Father   . Kidney disease Brother   . Heart disease Brother        also HTN, hyperlipidemia  . Heart attack Brother   . Stroke Sister        x2  . Hypertension Sister     Vitals:  Blood pressure 130/66, pulse 72, temperature 98.3 F (36.8 C), temperature source Oral, resp. rate 18, height _0  (1.549 m), weight 149 lb (67.6 kg), SpO2 98 %. BMI 28kg/m2  Physical Exam: WD in NAD CHEST:  CTA Abd.  Colostomy present with air and stool.  Parastomal hernia present.  Palpable masses or ascites Pelvic: Normal external genitalia Bartholin's urethra Skene's, deviously present left sidewall mass is no longer palpable.  Vagina is atrophic without any lesions rectal good tone no palpable masses Lymph nodes no cervical supraclavicular adenopathy Extremities 3-2+ left lower extremity edema extending to the knee Back: No CVA tenderness

## 2018-05-18 NOTE — Patient Instructions (Addendum)
Plan to have a doppler of the left lower extremity to check for a blood clot.  We will reach out to Dr. Lear Ng office about recommendations for ostomy support.  Plan to follow up in three months or sooner if needed    Thank you very much [name] for allowing me to provide care for you today.  I appreciate your confidence in choosing our Gynecologic Oncology team.  If you have any questions about your visit today please call our office and we will get back to you as soon as possible.  Please consider using the website Medlineplus.gov as an Geneticist, molecular.   Francetta Found. Brewster MD., PhD Gynecologic Oncology

## 2018-05-19 ENCOUNTER — Ambulatory Visit (HOSPITAL_COMMUNITY)
Admission: RE | Admit: 2018-05-19 | Discharge: 2018-05-19 | Disposition: A | Discharge status: Home / Self Care | Payer: Medicare Other | Source: Ambulatory Visit | Attending: Gynecologic Oncology | Admitting: Gynecologic Oncology

## 2018-05-19 DIAGNOSIS — R600 Localized edema: Secondary | ICD-10-CM | POA: Diagnosis present

## 2018-05-19 NOTE — Progress Notes (Signed)
Left lower extremity venous duplex completed. Refer to "CV Proc" under chart review to view preliminary results.  05/19/2018 10:38 AM Maudry Mayhew, MHA, RVT, RDCS, RDMS

## 2018-05-25 ENCOUNTER — Inpatient Hospital Stay: Payer: Medicare Other

## 2018-05-25 ENCOUNTER — Encounter: Payer: Self-pay | Admitting: Hematology & Oncology

## 2018-05-25 ENCOUNTER — Other Ambulatory Visit: Payer: Self-pay

## 2018-05-25 ENCOUNTER — Inpatient Hospital Stay (HOSPITAL_BASED_OUTPATIENT_CLINIC_OR_DEPARTMENT_OTHER): Payer: Medicare Other | Admitting: Hematology & Oncology

## 2018-05-25 VITALS — BP 147/45 | HR 66 | Temp 98.8°F | Resp 18 | Wt 149.5 lb

## 2018-05-25 DIAGNOSIS — R609 Edema, unspecified: Secondary | ICD-10-CM | POA: Diagnosis not present

## 2018-05-25 DIAGNOSIS — C541 Malignant neoplasm of endometrium: Secondary | ICD-10-CM | POA: Diagnosis not present

## 2018-05-25 DIAGNOSIS — C539 Malignant neoplasm of cervix uteri, unspecified: Secondary | ICD-10-CM

## 2018-05-25 DIAGNOSIS — Z5112 Encounter for antineoplastic immunotherapy: Secondary | ICD-10-CM | POA: Diagnosis not present

## 2018-05-25 DIAGNOSIS — D62 Acute posthemorrhagic anemia: Secondary | ICD-10-CM

## 2018-05-25 DIAGNOSIS — Z923 Personal history of irradiation: Secondary | ICD-10-CM

## 2018-05-25 DIAGNOSIS — C799 Secondary malignant neoplasm of unspecified site: Principal | ICD-10-CM

## 2018-05-25 LAB — CMP (CANCER CENTER ONLY)
ALT: 9 U/L (ref 0–44)
AST: 14 U/L — ABNORMAL LOW (ref 15–41)
Albumin: 4.1 g/dL (ref 3.5–5.0)
Alkaline Phosphatase: 81 U/L (ref 38–126)
Anion gap: 7 (ref 5–15)
BUN: 12 mg/dL (ref 8–23)
CO2: 28 mmol/L (ref 22–32)
Calcium: 9.4 mg/dL (ref 8.9–10.3)
Chloride: 102 mmol/L (ref 98–111)
Creatinine: 0.85 mg/dL (ref 0.44–1.00)
GFR, Est AFR Am: >60 mL/min (ref >60)
GFR, Estimated: >60 mL/min (ref >60)
Glucose, Bld: 109 mg/dL — ABNORMAL HIGH (ref 70–99)
Potassium: 3.6 mmol/L (ref 3.5–5.1)
Sodium: 137 mmol/L (ref 135–145)
Total Bilirubin: 0.4 mg/dL (ref 0.3–1.2)
Total Protein: 7.6 g/dL (ref 6.5–8.1)

## 2018-05-25 LAB — CBC WITH DIFFERENTIAL (CANCER CENTER ONLY)
Abs Immature Granulocytes: 0.01 10*3/uL (ref 0.00–0.07)
Basophils Absolute: 0 10*3/uL (ref 0.0–0.1)
Basophils Relative: 0 %
Eosinophils Absolute: 0 10*3/uL (ref 0.0–0.5)
Eosinophils Relative: 0 %
HCT: 35.5 % — ABNORMAL LOW (ref 36.0–46.0)
Hemoglobin: 11.7 g/dL — ABNORMAL LOW (ref 12.0–15.0)
Immature Granulocytes: 0 %
Lymphocytes Relative: 10 %
Lymphs Abs: 0.5 10*3/uL — ABNORMAL LOW (ref 0.7–4.0)
MCH: 31.3 pg (ref 26.0–34.0)
MCHC: 33 g/dL (ref 30.0–36.0)
MCV: 94.9 fL (ref 80.0–100.0)
Monocytes Absolute: 0.4 10*3/uL (ref 0.1–1.0)
Monocytes Relative: 8 %
Neutro Abs: 3.6 10*3/uL (ref 1.7–7.7)
Neutrophils Relative %: 82 %
Platelet Count: 221 10*3/uL (ref 150–400)
RBC: 3.74 MIL/uL — ABNORMAL LOW (ref 3.87–5.11)
RDW: 12.6 % (ref 11.5–15.5)
WBC Count: 4.4 10*3/uL (ref 4.0–10.5)
nRBC: 0 % (ref 0.0–0.2)

## 2018-05-25 MED ORDER — SODIUM CHLORIDE 0.9 % IV SOLN
Freq: Once | INTRAVENOUS | Status: AC
  Administered 2018-05-25: 12:00:00 via INTRAVENOUS
  Filled 2018-05-25: qty 250

## 2018-05-25 MED ORDER — HEPARIN SOD (PORK) LOCK FLUSH 100 UNIT/ML IV SOLN
500.0000 [IU] | Freq: Once | INTRAVENOUS | Status: AC | PRN
  Administered 2018-05-25: 500 [IU]
  Filled 2018-05-25: qty 5

## 2018-05-25 MED ORDER — SODIUM CHLORIDE 0.9 % IV SOLN
200.0000 mg | Freq: Once | INTRAVENOUS | Status: AC
  Administered 2018-05-25: 200 mg via INTRAVENOUS
  Filled 2018-05-25: qty 8

## 2018-05-25 MED ORDER — SODIUM CHLORIDE 0.9% FLUSH
10.0000 mL | INTRAVENOUS | Status: DC | PRN
  Administered 2018-05-25: 10 mL
  Filled 2018-05-25: qty 10

## 2018-05-25 NOTE — Progress Notes (Signed)
Hematology and Oncology Follow Up Visit  Shannon Scott [id number] February 16, 1941 77 y.o. 05/25/2018   Principle Diagnosis:  Locally advanced/recurrent endometrial carcinoma undifferentiated --  TMB (HIGH) / MSI HIGH  Current Therapy:   Radiation therapy for 5 -5 1/2 weeks - s/p 20 fractions Taxol/carboplatinumq 7 days -- s/p cycle #6 Pembrolizumab 200 mg IV q 3 wk -- s/p cycle #3 IV iron as indicated - last received 11/10/2017   Interim History: Ms. Campoverde is here today with her daughter for follow-up.  He had a very nice Christmas.  They ate quite a bit.  Thankfully, she is not having any problems with her colostomy.  She is done well with the pembrolizumab.  She is had 3 cycles to date.  So far, she is done quite well with the immunotherapy.   Her last CA 125 was 11.2 which is holding steady.  I would like to get a PET scan on her after this fourth cycle.  She has had no issues with rashes.  There is been no diarrhea with the colostomy.  She has had no cough.  She has had no fever.  There is been no bleeding.  Overall, her performance status is ECOG 1.     Medications:  Allergies as of 05/25/2018      Reactions   Statins Other (See Comments)   Myalgias and memory problems   Ciprofloxacin Itching   Splotchy redness with itching during IV infusion localized to arm.      Medication List       Accurate as of May 25, 2018 11:20 AM. Always use your most recent med list.        acetaminophen 500 MG tablet Commonly known as:  TYLENOL Take 500 mg by mouth every 6 (six) hours as needed for mild pain, moderate pain, fever or headache.   amoxicillin 500 MG tablet Commonly known as:  AMOXIL Take four tablets prior to dental procedure   belladonna-PHENObarbital 16.2 MG/5ML Elix Commonly known as:  DONNATAL Take 5 mLs (16.2 mg total) by mouth 4 (four) times daily.   diclofenac sodium 1 % Gel Commonly known as:  VOLTAREN Apply 2 g topically 4 (four) times  daily.   diltiazem 300 MG 24 hr capsule Commonly known as:  TIAZAC Take 1 capsule (300 mg total) by mouth daily.   dronabinol 2.5 MG capsule Commonly known as:  MARINOL TAKE 1 CAPSULE BY MOUTH TWICE DAILY BEFORE LUNCH AND SUPPER   KEFLEX PO Take by mouth 2 (two) times daily.   lactose free nutrition Liqd Take 237 mLs by mouth 3 (three) times daily with meals.   levothyroxine 50 MCG tablet Commonly known as:  SYNTHROID, LEVOTHROID Take 1 tablet (50 mcg total) by mouth daily before breakfast.   lidocaine-prilocaine cream Commonly known as:  EMLA Apply 1 application topically as needed.   lipase/protease/amylase 36000 UNITS Cpep capsule Commonly known as:  CREON Take 2 capsules (72,000 Units total) by mouth 3 (three) times daily before meals.   loperamide 2 MG capsule Commonly known as:  IMODIUM Take 2 mg by mouth as needed for diarrhea or loose stools.   losartan 100 MG tablet Commonly known as:  COZAAR Take 100 mg by mouth daily.   mirtazapine 15 MG tablet Commonly known as:  REMERON Take 1 tablet (15 mg total) by mouth at bedtime.   ondansetron 8 MG tablet Commonly known as:  ZOFRAN TAKE 1 TABLET(8 MG) BY MOUTH EVERY 8 HOURS AS NEEDED FOR NAUSEA OR  VOMITING   pantoprazole 40 MG tablet Commonly known as:  PROTONIX Take 1 tablet (40 mg total) by mouth daily.   polyethylene glycol packet Commonly known as:  MIRALAX / GLYCOLAX Take 17 g by mouth daily.   prochlorperazine 10 MG tablet Commonly known as:  COMPAZINE Take 1 tablet (10 mg total) by mouth every 6 (six) hours as needed for nausea or vomiting.   senna-docusate 8.6-50 MG tablet Commonly known as:  Senokot-S Take 1 tablet by mouth 2 (two) times daily.   traMADol 50 MG tablet Commonly known as:  ULTRAM TAKE 1 TABLET(50 MG) BY MOUTH EVERY 6 HOURS AS NEEDED FOR MODERATE PAIN       Allergies:  Allergies  Allergen Reactions  . Statins Other (See Comments)    Myalgias and memory problems  .  Ciprofloxacin Itching    Splotchy redness with itching during IV infusion localized to arm.    Past Medical History, Surgical history, Social history, and Family History were reviewed and updated.  Review of Systems: Review of Systems  Constitutional: Positive for malaise/fatigue and weight loss.  HENT: Negative.   Eyes: Negative.   Respiratory: Positive for cough.   Cardiovascular: Negative.   Gastrointestinal: Positive for nausea.  Genitourinary: Positive for frequency.  Musculoskeletal: Positive for myalgias.  Skin: Negative.   Neurological: Negative.   Endo/Heme/Allergies: Negative.   Psychiatric/Behavioral: Negative.      Physical Exam:  vitals were not taken for this visit.   Wt Readings from Last 3 Encounters:  05/18/18 149 lb (67.6 kg)  05/04/18 146 lb (66.2 kg)  04/06/18 145 lb 6 oz (65.9 kg)    Physical Exam Vitals signs reviewed.  HENT:     Head: Normocephalic and atraumatic.  Eyes:     Pupils: Pupils are equal, round, and reactive to light.  Neck:     Musculoskeletal: Normal range of motion.  Cardiovascular:     Rate and Rhythm: Normal rate and regular rhythm.     Comments: Regular rate and rhythm with a normal S1 and S2.  She has a 2/6 systolic ejection murmur. Pulmonary:     Effort: Pulmonary effort is normal.     Breath sounds: Normal breath sounds.  Abdominal:     Comments: Abdomen is soft.  She has a colostomy in the center of her abdomen.  This is actually below the umbilicus.  There is a slight hernia at the colostomy site.  Bowel sounds are active.  There is no guarding or rebound tenderness.  There is no fluid wave.  There is no palpable liver or spleen tip.  Musculoskeletal: Normal range of motion.        General: No tenderness or deformity.  Lymphadenopathy:     Cervical: No cervical adenopathy.  Skin:    General: Skin is warm and dry.     Findings: No erythema or rash.  Neurological:     Mental Status: She is alert and oriented to  person, place, and time.  Psychiatric:        Behavior: Behavior normal.        Thought Content: Thought content normal.        Judgment: Judgment normal.      Lab Results  Component Value Date   WBC 4.7 05/04/2018   HGB 11.6 (L) 05/04/2018   HCT 35.6 (L) 05/04/2018   MCV 96.0 05/04/2018   PLT 217 05/04/2018   Lab Results  Component Value Date   FERRITIN 583 (H) 05/04/2018  IRON 41 05/04/2018   TIBC 199 (L) 05/04/2018   UIBC 157 05/04/2018   IRONPCTSAT 21 05/04/2018   Lab Results  Component Value Date   RETICCTPCT 0.5 (L) 10/20/2017   RBC 3.71 (L) 05/04/2018   No results found for: KPAFRELGTCHN, LAMBDASER, KAPLAMBRATIO No results found for: IGGSERUM, IGA, IGMSERUM No results found for: Ronnald Ramp, A1GS, A2GS, Violet Baldy, MSPIKE, SPEI   Chemistry      Component Value Date/Time   NA 133 (L) 05/04/2018 1208   NA 136 (A) 09/08/2017   K 3.7 05/04/2018 1208   CL 98 05/04/2018 1208   CO2 30 05/04/2018 1208   BUN 14 05/04/2018 1208   BUN 8 09/08/2017   CREATININE 0.83 05/04/2018 1208   CREATININE 0.69 08/02/2016 1519   GLU 111 09/08/2017      Component Value Date/Time   CALCIUM 9.7 05/04/2018 1208   ALKPHOS 88 05/04/2018 1208   AST 14 (L) 05/04/2018 1208   ALT 10 05/04/2018 1208   BILITOT 0.4 05/04/2018 1208      Impression and Plan: Ms. Clopper is a very pleasant 77 yo caucasian female with recurrent undifferentiated endometrial carcinoma.   We will continue with the pembrolizumab.  This will be her fourth cycle of pembrolizumab.  I think that this should work nicely given that she has a very high TMB level with her cancer.  I will plan for another PET scan.  I would like to see how everything looks on the PET scan.  We will do the PET scan in 2-3 weeks.  I will plan to get her back in 3 weeks for her fifth cycle of treatment.    Volanda Napoleon, MD

## 2018-05-25 NOTE — Patient Instructions (Signed)

## 2018-05-25 NOTE — Addendum Note (Signed)
Addended by: Burney Gauze R on: 05/25/2018 12:21 PM   Modules accepted: Orders

## 2018-05-25 NOTE — Patient Instructions (Signed)
Pembrolizumab injection  What is this medicine?  PEMBROLIZUMAB (pem broe liz ue mab) is a monoclonal antibody. It is used to treat cervical cancer, esophageal cancer, head and neck cancer, hepatocellular cancer, Hodgkin lymphoma, kidney cancer, lymphoma, melanoma, Merkel cell carcinoma, lung cancer, stomach cancer, urothelial cancer, and cancers that have a certain genetic condition.  This medicine may be used for other purposes; ask your health care provider or pharmacist if you have questions.  COMMON BRAND NAME(S): Keytruda  What should I tell my health care provider before I take this medicine?  They need to know if you have any of these conditions:  -diabetes  -immune system problems  -inflammatory bowel disease  -liver disease  -lung or breathing disease  -lupus  -received or scheduled to receive an organ transplant or a stem-cell transplant that uses donor stem cells  -an unusual or allergic reaction to pembrolizumab, other medicines, foods, dyes, or preservatives  -pregnant or trying to get pregnant  -breast-feeding  How should I use this medicine?  This medicine is for infusion into a vein. It is given by a health care professional in a hospital or clinic setting.  A special MedGuide will be given to you before each treatment. Be sure to read this information carefully each time.  Talk to your pediatrician regarding the use of this medicine in children. While this drug may be prescribed for selected conditions, precautions do apply.  Overdosage: If you think you have taken too much of this medicine contact a poison control center or emergency room at once.  NOTE: This medicine is only for you. Do not share this medicine with others.  What if I miss a dose?  It is important not to miss your dose. Call your doctor or health care professional if you are unable to keep an appointment.  What may interact with this medicine?  Interactions have not been studied.  Give your health care provider a list of all the  medicines, herbs, non-prescription drugs, or dietary supplements you use. Also tell them if you smoke, drink alcohol, or use illegal drugs. Some items may interact with your medicine.  This list may not describe all possible interactions. Give your health care provider a list of all the medicines, herbs, non-prescription drugs, or dietary supplements you use. Also tell them if you smoke, drink alcohol, or use illegal drugs. Some items may interact with your medicine.  What should I watch for while using this medicine?  Your condition will be monitored carefully while you are receiving this medicine.  You may need blood work done while you are taking this medicine.  Do not become pregnant while taking this medicine or for 4 months after stopping it. Women should inform their doctor if they wish to become pregnant or think they might be pregnant. There is a potential for serious side effects to an unborn child. Talk to your health care professional or pharmacist for more information. Do not breast-feed an infant while taking this medicine or for 4 months after the last dose.  What side effects may I notice from receiving this medicine?  Side effects that you should report to your doctor or health care professional as soon as possible:  -allergic reactions like skin rash, itching or hives, swelling of the face, lips, or tongue  -bloody or black, tarry  -breathing problems  -changes in vision  -chest pain  -chills  -confusion  -constipation  -cough  -diarrhea  -dizziness or feeling faint or lightheaded  -  fast or irregular heartbeat  -fever  -flushing  -hair loss  -joint pain  -low blood counts - this medicine may decrease the number of white blood cells, red blood cells and platelets. You may be at increased risk for infections and bleeding.  -muscle pain  -muscle weakness  -persistent headache  -redness, blistering, peeling or loosening of the skin, including inside the mouth  -signs and symptoms of high blood sugar  such as dizziness; dry mouth; dry skin; fruity breath; nausea; stomach pain; increased hunger or thirst; increased urination  -signs and symptoms of kidney injury like trouble passing urine or change in the amount of urine  -signs and symptoms of liver injury like dark urine, light-colored stools, loss of appetite, nausea, right upper belly pain, yellowing of the eyes or skin  -sweating  -swollen lymph nodes  -weight loss  Side effects that usually do not require medical attention (report to your doctor or health care professional if they continue or are bothersome):  -decreased appetite  -muscle pain  -tiredness  This list may not describe all possible side effects. Call your doctor for medical advice about side effects. You may report side effects to FDA at 1-800-FDA-1088.  Where should I keep my medicine?  This drug is given in a hospital or clinic and will not be stored at home.  NOTE: This sheet is a summary. It may not cover all possible information. If you have questions about this medicine, talk to your doctor, pharmacist, or health care provider.   2019 [employer] (2017-12-29 15:06:10)

## 2018-05-26 LAB — IRON AND TIBC
Iron: 68 ug/dL (ref 41–142)
Saturation Ratios: 36 % (ref 21–57)
TIBC: 191 ug/dL — ABNORMAL LOW (ref 236–444)
UIBC: 123 ug/dL (ref 120–384)

## 2018-05-26 LAB — FERRITIN: Ferritin: 453 ng/mL — ABNORMAL HIGH (ref 11–307)

## 2018-05-26 LAB — CA 125: Cancer Antigen (CA) 125: 10.9 U/mL (ref 0.0–38.1)

## 2018-05-29 ENCOUNTER — Telehealth: Payer: Self-pay | Admitting: *Deleted

## 2018-05-29 DIAGNOSIS — M109 Gout, unspecified: Secondary | ICD-10-CM

## 2018-05-29 MED ORDER — COLCHICINE 0.6 MG PO TABS: 0.6000 mg | ORAL_TABLET | Freq: Every day | ORAL | 0 refills | Status: DC

## 2018-05-29 NOTE — Telephone Encounter (Signed)
Patient c/o pain and redness to her LEFT foot with the pain more specifically located to her toe. She states its red, hurts and it's hard to walk.  Spoke with Dr Marin Olp. He believes the symptoms related to gout and would like a prescription called in. If toe is not better in two days she needs to follow up with her primary care.  Patient is aware of new prescription and additional instruction.

## 2018-06-02 ENCOUNTER — Other Ambulatory Visit: Payer: Self-pay | Admitting: Hematology & Oncology

## 2018-06-02 DIAGNOSIS — M109 Gout, unspecified: Secondary | ICD-10-CM

## 2018-06-05 ENCOUNTER — Other Ambulatory Visit: Payer: Self-pay | Admitting: Internal Medicine

## 2018-06-05 ENCOUNTER — Ambulatory Visit
Admission: RE | Admit: 2018-06-05 | Discharge: 2018-06-05 | Disposition: A | Discharge status: Home / Self Care | Payer: Medicare Other | Source: Ambulatory Visit | Attending: Internal Medicine | Admitting: Internal Medicine

## 2018-06-05 ENCOUNTER — Ambulatory Visit (HOSPITAL_COMMUNITY)
Admission: RE | Admit: 2018-06-05 | Discharge: 2018-06-05 | Disposition: A | Discharge status: Home / Self Care | Payer: Medicare Other | Source: Ambulatory Visit | Attending: Hematology & Oncology | Admitting: Hematology & Oncology

## 2018-06-05 DIAGNOSIS — C799 Secondary malignant neoplasm of unspecified site: Secondary | ICD-10-CM | POA: Diagnosis not present

## 2018-06-05 DIAGNOSIS — C539 Malignant neoplasm of cervix uteri, unspecified: Secondary | ICD-10-CM | POA: Diagnosis present

## 2018-06-05 DIAGNOSIS — M545 Low back pain, unspecified: Secondary | ICD-10-CM

## 2018-06-05 LAB — GLUCOSE, CAPILLARY: Glucose-Capillary: 102 mg/dL — ABNORMAL HIGH (ref 70–99)

## 2018-06-05 MED ORDER — FLUDEOXYGLUCOSE F - 18 (FDG) INJECTION
7.2000 | Freq: Once | INTRAVENOUS | Status: AC | PRN
  Administered 2018-06-05: 7.2 via INTRAVENOUS

## 2018-06-07 ENCOUNTER — Other Ambulatory Visit: Payer: Self-pay | Admitting: Hematology & Oncology

## 2018-06-07 DIAGNOSIS — M109 Gout, unspecified: Secondary | ICD-10-CM

## 2018-06-08 ENCOUNTER — Telehealth: Payer: Self-pay | Admitting: *Deleted

## 2018-06-08 NOTE — Telephone Encounter (Signed)
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  There might be some new activity in the chest, but this is not definite!!  I think we are on the right treatment!!  Keep eating!!!  Laurey Arrow

## 2018-06-08 NOTE — Telephone Encounter (Signed)
As noted below by Dr. Marin Olp, I informed the patient about her results. She verbalized understanding.

## 2018-06-12 ENCOUNTER — Other Ambulatory Visit: Payer: Self-pay | Admitting: *Deleted

## 2018-06-12 ENCOUNTER — Telehealth: Payer: Self-pay | Admitting: *Deleted

## 2018-06-12 MED ORDER — HYDROCODONE-ACETAMINOPHEN 5-325 MG PO TABS: 1.0000 | ORAL_TABLET | Freq: Four times a day (QID) | ORAL | 0 refills | Status: DC | PRN

## 2018-06-12 NOTE — Telephone Encounter (Signed)
Message received from patient stating that she is having new lower back pain and would like a stronger pain medicine called in from Dr. Marin Olp.  Call placed back to patient and patient states that her pain is a 10/10, throbbing to her lower back and that she is unable to "bend over".  She is having no difficulty voiding or with her bowels. She states that she is taking Tramadol and Tylenol with little relief.  Dr. Marin Olp notified and order received for pt to go to the ER now, since she is having difficulty with ambulation.  Patient states that she will call her daughter to take her to the ER now.

## 2018-06-13 ENCOUNTER — Encounter (HOSPITAL_BASED_OUTPATIENT_CLINIC_OR_DEPARTMENT_OTHER): Payer: Self-pay | Admitting: Emergency Medicine

## 2018-06-13 ENCOUNTER — Emergency Department (HOSPITAL_BASED_OUTPATIENT_CLINIC_OR_DEPARTMENT_OTHER): Payer: Medicare Other

## 2018-06-13 ENCOUNTER — Emergency Department (HOSPITAL_BASED_OUTPATIENT_CLINIC_OR_DEPARTMENT_OTHER)
Admission: EM | Admit: 2018-06-13 | Discharge: 2018-06-13 | Disposition: A | Discharge status: Home / Self Care | Payer: Medicare Other | Attending: Emergency Medicine | Admitting: Emergency Medicine

## 2018-06-13 ENCOUNTER — Other Ambulatory Visit: Payer: Self-pay | Admitting: Hematology & Oncology

## 2018-06-13 ENCOUNTER — Other Ambulatory Visit: Payer: Self-pay

## 2018-06-13 DIAGNOSIS — R600 Localized edema: Secondary | ICD-10-CM | POA: Insufficient documentation

## 2018-06-13 DIAGNOSIS — J45909 Unspecified asthma, uncomplicated: Secondary | ICD-10-CM | POA: Insufficient documentation

## 2018-06-13 DIAGNOSIS — E039 Hypothyroidism, unspecified: Secondary | ICD-10-CM | POA: Insufficient documentation

## 2018-06-13 DIAGNOSIS — I10 Essential (primary) hypertension: Secondary | ICD-10-CM | POA: Insufficient documentation

## 2018-06-13 DIAGNOSIS — M545 Low back pain: Secondary | ICD-10-CM | POA: Diagnosis present

## 2018-06-13 DIAGNOSIS — M5126 Other intervertebral disc displacement, lumbar region: Secondary | ICD-10-CM | POA: Insufficient documentation

## 2018-06-13 DIAGNOSIS — Z79899 Other long term (current) drug therapy: Secondary | ICD-10-CM | POA: Diagnosis not present

## 2018-06-13 DIAGNOSIS — M79605 Pain in left leg: Secondary | ICD-10-CM | POA: Insufficient documentation

## 2018-06-13 DIAGNOSIS — Z8673 Personal history of transient ischemic attack (TIA), and cerebral infarction without residual deficits: Secondary | ICD-10-CM | POA: Insufficient documentation

## 2018-06-13 DIAGNOSIS — Z933 Colostomy status: Secondary | ICD-10-CM | POA: Insufficient documentation

## 2018-06-13 DIAGNOSIS — Z951 Presence of aortocoronary bypass graft: Secondary | ICD-10-CM | POA: Insufficient documentation

## 2018-06-13 DIAGNOSIS — I2510 Atherosclerotic heart disease of native coronary artery without angina pectoris: Secondary | ICD-10-CM | POA: Diagnosis not present

## 2018-06-13 MED ORDER — OXYCODONE-ACETAMINOPHEN 5-325 MG PO TABS: 1.0000 | ORAL_TABLET | Freq: Four times a day (QID) | ORAL | 0 refills | Status: DC | PRN

## 2018-06-13 MED ORDER — DEXAMETHASONE SODIUM PHOSPHATE 10 MG/ML IJ SOLN
10.0000 mg | Freq: Once | INTRAMUSCULAR | Status: AC
  Administered 2018-06-13: 10 mg via INTRAMUSCULAR
  Filled 2018-06-13: qty 1

## 2018-06-13 NOTE — ED Notes (Signed)
Pt reports taking TWO 5-325 percocet while waiting in room. Pain has improved since. Pt sitting in wheelchair and requests to stay due to pain with moving and laying down.

## 2018-06-13 NOTE — ED Triage Notes (Signed)
Pt sts she was sent to the ED by Dr. Jonette Eva for back pain.  Pt sts she has had a PET scan and xrays in the past but no answers.  Pt sts he sent her down here for "an MRI or CT or something." Pain is in bil low back and occasionally radiates into upper legs.

## 2018-06-13 NOTE — ED Notes (Signed)
Pt back in treatment room. No complaints at this time. Family at bedside.

## 2018-06-13 NOTE — ED Notes (Signed)
Pt remains in ultrasound at this time.

## 2018-06-13 NOTE — ED Notes (Signed)
Warm blanket and two hot packs given to PT

## 2018-06-13 NOTE — ED Notes (Addendum)
Patient transported to CT 

## 2018-06-13 NOTE — ED Provider Notes (Signed)
[facility] DEPART[facility] Provider Note   CSN: [id number] Arrival date & time: 06/13/18  1411     History   Chief Complaint Chief Complaint  Patient presents with  . Back Pain    HPI Shannon Scott is a 78 y.o. female.  Pt with hx of endometrial cancer, currently undergoing treatment with immunotherapy with Dr. Marin Olp presents with LBP.  She reports pain to lower back, radiating across both buttocks and intermittently radiates down both legs.  No radiation currently.  No numbness to legs.  No loss of bowel or bladder control.  No fevers, no associated abd pain.  No n/v.  No recent falls.  No prior hx of back problems.  Is having trouble walking due to the pain.  Also has come swelling of her left leg with has been there about 2-3 weeks.  No pain to leg.  Has been seen by her physician for this and reportedly had doppler which was negative.       Past Medical History:  Diagnosis Date  . Arthritis   . Asthma    allergy induced  . Bilateral carotid artery disease (HCC)    L carotid bruit  . Bursitis    left hip  . CAD (coronary artery disease)   . Cancer ([place])   . Dyslipidemia    intolerant to statins, welchol, niacin, zetia  . Goals of care, counseling/discussion 10/11/2017  . History of blood transfusion   . History of nuclear stress test 04/24/2012   lexiscan; normal study  . Hypertension   . Hypothyroidism   . 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 ([place]) 10/11/2017  . Postoperative nausea and vomiting 01/02/2016    Patient Active Problem List   Diagnosis Date Noted  . IDA (iron deficiency anemia) 10/21/2017  . Abdominal mass   . Deep vein thrombosis (DVT) of left lower extremity ([place])   . Malignant neoplasm of uterus ([place])   . Pelvic mass   . Acute blood loss anemia   . 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 ([place]) 10/11/2017  . Goals of care,  counseling/discussion 10/11/2017  . Rectal bleeding 10/03/2017  . Normocytic anemia 10/03/2017  . Left hip pain 10/03/2017  . Generalized weakness 10/03/2017  . Colostomy status ([place]) 08/30/2017  . Ureteral stent displacement, subsequent encounter 08/30/2017  . Atrial fibrillation with RVR ([place]) 08/30/2017  . Hypophosphatemia 08/30/2017  . Hypomagnesemia 08/30/2017  . Acute blood loss as cause of postoperative anemia 08/30/2017  . Chronic anemia 08/30/2017  . Left leg swelling 08/30/2017  . GERD (gastroesophageal reflux disease) 08/30/2017  . New onset atrial fibrillation ([place])   . Bowel perforation ([place]) 08/18/2017  . Colonic mass 08/18/2017  . Ureteral dilatation 08/18/2017  . Intractable right heel pain 10/07/2016  . Plantar fasciitis of right foot 10/07/2016  . Postoperative nausea and vomiting 01/02/2016  . Hyponatremia 01/01/2016  . Acute hypokalemia 01/01/2016  . Endometrial cancer ([place]) 12/30/2015  . Familial hypercholesteremia 07/28/2015  . Aortic valve stenosis 08/02/2014  . S/P CABG x 3 01/13/2013  . Palpitations 01/13/2013  . Medication intolerance 01/13/2013  . Chronic pain 01/13/2013  . TIA (transient ischemic attack) 01/13/2013  . Carotid stenosis 01/13/2013  . Hypothyroid 10/05/2010  . Essential hypertension 10/05/2010  . Gastroesophagitis 10/05/2010  . Atrophic gastritis 10/05/2010    Past Surgical History:  Procedure Laterality Date  . ABDOMINAL HYSTERECTOMY    . Carotid Doppler  02/2012   40-59% right  int carotid artery stenosis; 60-79% L int carotid stenosis; L carotid bruit  . CORONARY ARTERY BYPASS GRAFT  03/12/2004   LIMA to LAD, SVG to circumflex, SVG to PDA  . CYSTOSCOPY W/ URETERAL STENT PLACEMENT Left 12/06/2017   Procedure: CYSTOSCOPY WITH LEFT URETERAL STENT EXCHANGE;  Surgeon: Irine Seal, MD;  Location: [facility];  Service: Urology;  Laterality: Left;  . CYSTOSCOPY WITH STENT PLACEMENT Bilateral 08/21/2017   Procedure: CYSTOSCOPY WITH STENT  PLACEMENT;  Surgeon: Ceasar Mons, MD;  Location: [facility];  Service: Urology;  Laterality: Bilateral;  . IR FLUORO GUIDE PORT INSERTION RIGHT  10/11/2017  . IR GENERIC HISTORICAL  04/29/2016   IR RADIOLOGIST EVAL & MGMT 04/29/2016 Sandi Mariscal, MD [facility] INTERV RAD  . IR GENERIC HISTORICAL  05/12/2016   IR RADIOLOGIST EVAL & MGMT 05/12/2016 Sandi Mariscal, MD [facility] INTERV RAD  . IR IVC FILTER PLMT / S&I /IMG GUID/MOD SED  10/11/2017  . IR US GUIDE VASC ACCESS RIGHT  10/11/2017  . ROBOTIC ASSISTED TOTAL HYSTERECTOMY WITH BILATERAL SALPINGO OOPHERECTOMY Bilateral 12/30/2015   Procedure: XI ROBOTIC ASSISTED TOTAL HYSTERECTOMY WITH BILATERAL SALPINGO OOPHORECTOMY WITH SENTAL LYMPH NODE BIOPSY;  Surgeon: Nancy Marus, MD;  Location: [facility];  Service: Gynecology;  Laterality: Bilateral;  . TONSILLECTOMY    . TRANSTHORACIC ECHOCARDIOGRAM  04/2007   EF>55%; mild MR; mild-mod TR; mild pulm HTN; mild calcification of aortiv valve leaflets with mild valvular aortic stenosis  . TUBAL LIGATION       OB History   No obstetric history on file.      Home Medications    Prior to Admission medications   Medication Sig Start Date End Date Taking? Authorizing Provider  acetaminophen (TYLENOL) 500 MG tablet Take 500 mg by mouth every 6 (six) hours as needed for mild pain, moderate pain, fever or headache.    [provider]  amoxicillin (AMOXIL) 500 MG tablet Take four tablets prior to dental procedure 02/03/18   Volanda Napoleon, MD  belladonna-PHENObarbital (DONNATAL) 16.2 MG/5ML ELIX Take 5 mLs (16.2 mg total) by mouth 4 (four) times daily. 10/20/17   Volanda Napoleon, MD  colchicine 0.6 MG tablet TAKE 1 TABLET(0.6 MG) BY MOUTH DAILY 06/02/18   Volanda Napoleon, MD  diclofenac sodium (VOLTAREN) 1 % GEL Apply 2 g topically 4 (four) times daily.    [provider]  diltiazem (TIAZAC) 300 MG 24 hr capsule TAKE 1 CAPSULE(300 MG) BY MOUTH DAILY 06/13/18   Volanda Napoleon, MD  dronabinol  (MARINOL) 2.5 MG capsule TAKE 1 CAPSULE BY MOUTH TWICE DAILY BEFORE LUNCH AND SUPPER 04/06/18   Volanda Napoleon, MD  lactose free nutrition (BOOST PLUS) LIQD Take 237 mLs by mouth 3 (three) times daily with meals. 10/16/17   Eugenie Filler, MD  levothyroxine (SYNTHROID, LEVOTHROID) 50 MCG tablet Take 1 tablet (50 mcg total) by mouth daily before breakfast. 10/25/17   Tanner, Lyndon Code., PA-C  lidocaine-prilocaine (EMLA) cream Apply 1 application topically as needed. 10/20/17   Volanda Napoleon, MD  lipase/protease/amylase (CREON) 36000 UNITS CPEP capsule Take 2 capsules (72,000 Units total) by mouth 3 (three) times daily before meals. 12/16/17   Volanda Napoleon, MD  loperamide (IMODIUM) 2 MG capsule Take 2 mg by mouth as needed for diarrhea or loose stools.     [provider]  losartan (COZAAR) 100 MG tablet Take 100 mg by mouth daily.    [provider]  mirtazapine (REMERON) 15 MG tablet Take 1  tablet (15 mg total) by mouth at bedtime. 10/16/17   Eugenie Filler, MD  ondansetron (ZOFRAN) 8 MG tablet TAKE 1 TABLET(8 MG) BY MOUTH EVERY 8 HOURS AS NEEDED FOR NAUSEA OR VOMITING 06/02/18   Volanda Napoleon, MD  oxyCODONE-acetaminophen (PERCOCET) 5-325 MG tablet Take 1-2 tablets by mouth every 6 (six) hours as needed. 06/13/18   Malvin Johns, MD  pantoprazole (PROTONIX) 40 MG tablet Take 1 tablet (40 mg total) by mouth daily. 08/27/17   Sheikh, Georgina Quint Latif, DO  polyethylene glycol [facility] / GLYCOLAX) packet Take 17 g by mouth daily. 10/16/17   Eugenie Filler, MD  prochlorperazine (COMPAZINE) 10 MG tablet Take 1 tablet (10 mg total) by mouth every 6 (six) hours as needed for nausea or vomiting. 10/20/17   Volanda Napoleon, MD  senna-docusate (SENOKOT-S) 8.6-50 MG tablet Take 1 tablet by mouth 2 (two) times daily. 10/16/17   Eugenie Filler, MD  traMADol (ULTRAM) 50 MG tablet TAKE 1 TABLET(50 MG) BY MOUTH EVERY 6 HOURS AS NEEDED FOR MODERATE PAIN 05/13/18   Volanda Napoleon, MD     Family History Family History  Problem Relation Age of Onset  . Hypertension Mother   . Heart disease Mother        Died in her 29s  . Stroke Father   . Kidney disease Brother   . Heart disease Brother        also HTN, hyperlipidemia  . Heart attack Brother   . Stroke Sister        x2  . Hypertension Sister     Social History Social History   Tobacco Use  . Smoking status: Never Smoker  . Smokeless tobacco: Never Used  Substance Use Topics  . Alcohol use: No  . Drug use: No     Allergies   Statins and Ciprofloxacin   Review of Systems Review of Systems  Constitutional: Negative for chills, diaphoresis, fatigue and fever.  HENT: Negative for congestion, rhinorrhea and sneezing.   Eyes: Negative.   Respiratory: Negative for cough, chest tightness and shortness of breath.   Cardiovascular: Positive for leg swelling. Negative for chest pain.  Gastrointestinal: Negative for abdominal pain, blood in stool, diarrhea, nausea and vomiting.  Genitourinary: Negative for difficulty urinating, flank pain, frequency and hematuria.  Musculoskeletal: Positive for back pain. Negative for arthralgias.  Skin: Negative for rash.  Neurological: Negative for dizziness, speech difficulty, weakness, numbness and headaches.     Physical Exam Updated Vital Signs BP (!) 170/53   Pulse 63   Temp 98.7 F (37.1 C) (Oral)   Resp 18   Ht 5\' 1"  (1.549 m)   Wt 67.8 kg   SpO2 99%   BMI 28.24 kg/m   Physical Exam Constitutional:      Appearance: She is well-developed.  HENT:     Head: Normocephalic and atraumatic.  Eyes:     Pupils: Pupils are equal, round, and reactive to light.  Neck:     Musculoskeletal: Normal range of motion and neck supple.  Cardiovascular:     Rate and Rhythm: Normal rate and regular rhythm.     Heart sounds: Normal heart sounds.  Pulmonary:     Effort: Pulmonary effort is normal. No respiratory distress.     Breath sounds: Normal breath sounds. No  wheezing or rales.  Chest:     Chest wall: No tenderness.  Abdominal:     General: Bowel sounds are normal.     Palpations: Abdomen  is soft.     Tenderness: There is no abdominal tenderness. There is no guarding or rebound.     Comments: Colostomy bag in place  Musculoskeletal: Normal range of motion.     Left lower leg: Edema present.     Comments: 2+edema of LLE, minimal swelling to RLE, no redness or erythema, pedal pulses intact.  +TTP to lower lumbar spine/sacrum.  No step offs noted.  Neg SLR bilaterally.  Normal sensation to LT both lower extremities.  Has good strength on flexion/extension of lower legs bilaterally.  Lymphadenopathy:     Cervical: No cervical adenopathy.  Skin:    General: Skin is warm and dry.     Findings: No rash.  Neurological:     Mental Status: She is alert and oriented to person, place, and time.      ED Treatments / Results  Labs (all labs ordered are listed, but only abnormal results are displayed) Labs Reviewed - No data to display  EKG None  Radiology Ct Lumbar Spine Wo Contrast  Result Date: 06/13/2018 CLINICAL DATA:  Lumbar pain for 1 week. Symptoms have been worse over the last 3 days. Pain extends into the hips, right greater than left. The patient reports occasional radicular symptoms into the left thigh. EXAM: CT LUMBAR SPINE WITHOUT CONTRAST TECHNIQUE: Multidetector CT imaging of the lumbar spine was performed without intravenous contrast administration. Multiplanar CT image reconstructions were also generated. COMPARISON:  Lumbar spine radiographs 06/07/2018 FINDINGS: Segmentation: 5 non rib-bearing lumbar type vertebral bodies are present. The lowest fully formed vertebral body is L5. Alignment: Slight degenerative anterolisthesis is present at L4-5. AP alignment is otherwise anatomic. There is minimal leftward curvature centered at L2-3. Vertebrae: Vertebral body heights are maintained. No acute or healing fractures are present. No focal  lytic or blastic lesions are present. Paraspinal and other soft tissues: IVC filter is in place. Extensive atherosclerotic calcifications are present in the aorta and branch vessels. There is no aneurysm. No solid organ lesions are present. Sigmoid diverticula are present without diverticulitis. Disc levels: L1-2: Negative. L2-3: Negative. L3-4: Mild facet hypertrophy is present. There is some disc bulging without significant stenosis. L4-5: A broad-based disc protrusion is present. Moderate facet hypertrophy is noted bilaterally. This results in mild central and bilateral foraminal narrowing. L5-S1: Mild disc bulging is present. Facet hypertrophy is worse right than left. There is no significant stenosis. IMPRESSION: 1. Mild central and bilateral foraminal narrowing at L4-5 secondary to a broad-based disc protrusion and moderate bilateral facet hypertrophy. 2. Slight anterolisthesis at L4-5. Given the degenerative facet disease, this could be dynamic. Recommend flexion and extension radiographs of the lumbar spine to evaluate stability at this level. 3.  Aortic Atherosclerosis (ICD10-I70.0). 4. IVC filter in situ. 5. Sigmoid diverticulosis without diverticulitis. Electronically Signed   By: San Morelle M.D.   On: 06/13/2018 17:40   Dg Lumbar Spine Flex & Extend Only  Result Date: 06/13/2018 CLINICAL DATA:  Lumbar spine pain for or bone week worse in past 2 days, earlier lumbar CT EXAM: LUMBAR SPINE FLEX AND EXTEND ONLY - 2-3 VIEW COMPARISON:  Prior CT lumbar spine 06/13/2018 FINDINGS: Bones appear demineralized. But non-rib-bearing lumbar type segments. Minimal endplate spur formation at several levels in the lower thoracic spine. Vertebral body heights maintained without fracture or subluxation. No abnormal motion identified with flexion or extension, when compared to the preceding CT. Atherosclerotic calcifications aorta. IVC filter noted. IMPRESSION: No acute lumbar spine abnormalities identified.  Electronically Signed   By:  Lavonia Dana M.D.   On: 06/13/2018 18:21   US Venous Img Lower Unilateral Left  Result Date: 06/13/2018 CLINICAL DATA:  LEFT lower extremity pain and edema/swelling for 2-3 weeks EXAM: LEFT LOWER EXTREMITY VENOUS DOPPLER ULTRASOUND TECHNIQUE: Gray-scale sonography with graded compression, as well as color Doppler and duplex ultrasound were performed to evaluate the lower extremity deep venous systems from the level of the common femoral vein and including the common femoral, femoral, profunda femoral, popliteal and calf veins including the posterior tibial, peroneal and gastrocnemius veins when visible. The superficial great saphenous vein was also interrogated. Spectral Doppler was utilized to evaluate flow at rest and with distal augmentation maneuvers in the common femoral, femoral and popliteal veins. COMPARISON:  None FINDINGS: Contralateral Common Femoral Vein: Respiratory phasicity is normal and symmetric with the symptomatic side. No evidence of thrombus. Normal compressibility. Common Femoral Vein: No evidence of thrombus. Normal compressibility, respiratory phasicity and response to augmentation. Saphenofemoral Junction: No evidence of thrombus. Normal compressibility and flow on color Doppler imaging. Profunda Femoral Vein: No evidence of thrombus. Normal compressibility and flow on color Doppler imaging. Femoral Vein: No evidence of thrombus. Normal compressibility, respiratory phasicity and response to augmentation. Popliteal Vein: No evidence of thrombus. Normal compressibility, respiratory phasicity and response to augmentation. Calf Veins: No evidence of thrombus. Normal compressibility and flow on color Doppler imaging. Superficial Great Saphenous Vein: No evidence of thrombus. Normal compressibility. Venous Reflux:  None. Other Findings:  None. IMPRESSION: No evidence of deep venous thrombosis in the LEFT lower extremity. Electronically Signed   By: Lavonia Dana M.D.    On: 06/13/2018 18:19    Procedures Procedures (including critical care time)  Medications Ordered in ED Medications  dexamethasone (DECADRON) injection 10 mg (has no administration in time range)     Initial Impression / Assessment and Plan / ED Course  I have reviewed the triage vital signs and the nursing notes.  Pertinent labs & imaging results that were available during my care of the patient were reviewed by me and considered in my medical decision making (see chart for details).     Patient is a 78 year old female who presents with low back pain.  She presently does not have radicular pain.  No associate abdominal pain.  No neurologic deficits.  No suggestions of cauda equina.  CT scan shows positive disc herniation at L4-L5 area with some anterior listhesis of questionable chronicity.  Flex ex films were obtained which showed no instability.  I did feel like it was reasonable to try a dose of Decadron for her symptoms.  She also requested a different pain medication.  I will switch her from hydrocodone to oxycodone.  I did stress that she cannot take them together.  She also is going to use some lidocaine patches which she has at home as well as the Voltaren cream.  She has a follow-up appointment with Dr. Marin Olp on Thursday which is in 2 days.  Return precautions were given.  Final Clinical Impressions(s) / ED Diagnoses   Final diagnoses:  HNP (herniated nucleus pulposus), lumbar    ED Discharge Orders         Ordered    oxyCODONE-acetaminophen (PERCOCET) 5-325 MG tablet  Every 6 hours PRN     06/13/18 1843           Malvin Johns, MD 06/13/18 1850

## 2018-06-13 NOTE — ED Notes (Signed)
NAD at this time. Pt is stable and going home.  

## 2018-06-15 ENCOUNTER — Inpatient Hospital Stay: Payer: Medicare Other

## 2018-06-15 ENCOUNTER — Inpatient Hospital Stay: Payer: Medicare Other | Attending: Hematology & Oncology | Admitting: Hematology & Oncology

## 2018-06-15 ENCOUNTER — Other Ambulatory Visit: Payer: Self-pay

## 2018-06-15 VITALS — BP 139/39 | HR 65 | Temp 98.4°F | Resp 18 | Wt 151.0 lb

## 2018-06-15 DIAGNOSIS — C541 Malignant neoplasm of endometrium: Secondary | ICD-10-CM

## 2018-06-15 DIAGNOSIS — Z933 Colostomy status: Secondary | ICD-10-CM | POA: Insufficient documentation

## 2018-06-15 DIAGNOSIS — C799 Secondary malignant neoplasm of unspecified site: Principal | ICD-10-CM

## 2018-06-15 DIAGNOSIS — M48061 Spinal stenosis, lumbar region without neurogenic claudication: Secondary | ICD-10-CM | POA: Diagnosis not present

## 2018-06-15 DIAGNOSIS — E079 Disorder of thyroid, unspecified: Secondary | ICD-10-CM | POA: Diagnosis not present

## 2018-06-15 DIAGNOSIS — C539 Malignant neoplasm of cervix uteri, unspecified: Secondary | ICD-10-CM

## 2018-06-15 DIAGNOSIS — Z5112 Encounter for antineoplastic immunotherapy: Secondary | ICD-10-CM | POA: Diagnosis not present

## 2018-06-15 LAB — CBC WITH DIFFERENTIAL (CANCER CENTER ONLY)
Abs Immature Granulocytes: 0.08 10*3/uL — ABNORMAL HIGH (ref 0.00–0.07)
Basophils Absolute: 0 10*3/uL (ref 0.0–0.1)
Basophils Relative: 0 %
Eosinophils Absolute: 0 10*3/uL (ref 0.0–0.5)
Eosinophils Relative: 0 %
HCT: 36.3 % (ref 36.0–46.0)
Hemoglobin: 12 g/dL (ref 12.0–15.0)
Immature Granulocytes: 1 %
Lymphocytes Relative: 5 %
Lymphs Abs: 0.6 10*3/uL — ABNORMAL LOW (ref 0.7–4.0)
MCH: 31.7 pg (ref 26.0–34.0)
MCHC: 33.1 g/dL (ref 30.0–36.0)
MCV: 95.8 fL (ref 80.0–100.0)
Monocytes Absolute: 0.9 10*3/uL (ref 0.1–1.0)
Monocytes Relative: 7 %
Neutro Abs: 11.3 10*3/uL — ABNORMAL HIGH (ref 1.7–7.7)
Neutrophils Relative %: 87 %
Platelet Count: 290 10*3/uL (ref 150–400)
RBC: 3.79 MIL/uL — ABNORMAL LOW (ref 3.87–5.11)
RDW: 13 % (ref 11.5–15.5)
WBC Count: 13 10*3/uL — ABNORMAL HIGH (ref 4.0–10.5)
nRBC: 0 % (ref 0.0–0.2)

## 2018-06-15 LAB — CMP (CANCER CENTER ONLY)
ALT: 16 U/L (ref 0–44)
AST: 16 U/L (ref 15–41)
Albumin: 4.2 g/dL (ref 3.5–5.0)
Alkaline Phosphatase: 87 U/L (ref 38–126)
Anion gap: 10 (ref 5–15)
BUN: 25 mg/dL — ABNORMAL HIGH (ref 8–23)
CO2: 27 mmol/L (ref 22–32)
Calcium: 9.6 mg/dL (ref 8.9–10.3)
Chloride: 100 mmol/L (ref 98–111)
Creatinine: 0.96 mg/dL (ref 0.44–1.00)
GFR, Est AFR Am: >60 mL/min (ref >60)
GFR, Estimated: 57 mL/min — ABNORMAL LOW (ref >60)
Glucose, Bld: 144 mg/dL — ABNORMAL HIGH (ref 70–99)
Potassium: 3.6 mmol/L (ref 3.5–5.1)
Sodium: 137 mmol/L (ref 135–145)
Total Bilirubin: 0.3 mg/dL (ref 0.3–1.2)
Total Protein: 7.3 g/dL (ref 6.5–8.1)

## 2018-06-15 MED ORDER — SODIUM CHLORIDE 0.9 % IV SOLN
200.0000 mg | Freq: Once | INTRAVENOUS | Status: AC
  Administered 2018-06-15: 200 mg via INTRAVENOUS
  Filled 2018-06-15: qty 8

## 2018-06-15 MED ORDER — MORPHINE SULFATE 4 MG/ML IJ SOLN
2.0000 mg | Freq: Once | INTRAMUSCULAR | Status: AC
  Administered 2018-06-15: 2 mg via INTRAVENOUS
  Filled 2018-06-15: qty 1

## 2018-06-15 MED ORDER — TRAMADOL HCL 50 MG PO TABS: 100.0000 mg | ORAL_TABLET | Freq: Four times a day (QID) | ORAL | 0 refills | Status: DC | PRN

## 2018-06-15 MED ORDER — OXYCODONE-ACETAMINOPHEN 5-325 MG PO TABS: 1.0000 | ORAL_TABLET | Freq: Four times a day (QID) | ORAL | 0 refills | Status: DC | PRN

## 2018-06-15 MED ORDER — SODIUM CHLORIDE 0.9 % IV SOLN
Freq: Once | INTRAVENOUS | Status: AC
  Administered 2018-06-15: 15:00:00 via INTRAVENOUS
  Filled 2018-06-15: qty 250

## 2018-06-15 MED ORDER — SODIUM CHLORIDE 0.9% FLUSH
10.0000 mL | INTRAVENOUS | Status: DC | PRN
  Administered 2018-06-15: 10 mL
  Filled 2018-06-15: qty 10

## 2018-06-15 MED ORDER — MORPHINE SULFATE (PF) 4 MG/ML IV SOLN
INTRAVENOUS | Status: AC
  Filled 2018-06-15: qty 1

## 2018-06-15 MED ORDER — HEPARIN SOD (PORK) LOCK FLUSH 100 UNIT/ML IV SOLN
500.0000 [IU] | Freq: Once | INTRAVENOUS | Status: AC | PRN
  Administered 2018-06-15: 500 [IU]
  Filled 2018-06-15: qty 5

## 2018-06-15 NOTE — Patient Instructions (Signed)
[facility] Discharge Instructions for Patients Receiving Chemotherapy  Today you received the following chemotherapy agents:  Keytruda  To help prevent nausea and vomiting after your treatment, we encourage you to take your nausea medication as ordered per MD.    If you develop nausea and vomiting that is not controlled by your nausea medication, call the clinic.   BELOW ARE SYMPTOMS THAT SHOULD BE REPORTED IMMEDIATELY:  *FEVER GREATER THAN 100.5 F  *CHILLS WITH OR WITHOUT FEVER  NAUSEA AND VOMITING THAT IS NOT CONTROLLED WITH YOUR NAUSEA MEDICATION  *UNUSUAL SHORTNESS OF BREATH  *UNUSUAL BRUISING OR BLEEDING  TENDERNESS IN MOUTH AND THROAT WITH OR WITHOUT PRESENCE OF ULCERS  *URINARY PROBLEMS  *BOWEL PROBLEMS  UNUSUAL RASH Items with * indicate a potential emergency and should be followed up as soon as possible.  Feel free to call the clinic should you have any questions or concerns. The clinic phone number is [phone].  Please show the CHEMO ALERT CARD at check-in to the Emergency Department and triage nurse.   

## 2018-06-15 NOTE — Progress Notes (Signed)
Hematology and Oncology Follow Up Visit  Shannon Scott [id number] May 10, 1941 78 y.o. 06/15/2018   Principle Diagnosis:  Locally advanced/recurrent endometrial carcinoma undifferentiated --  TMB (HIGH) / MSI HIGH  Current Therapy:   Radiation therapy for 5 -5 1/2 weeks - s/p 20 fractions Taxol/carboplatinumq 7 days -- s/p cycle #6 Pembrolizumab 200 mg IV q 3 wk -- s/p cycle #4 IV iron as indicated - last received 11/10/2017   Interim History: Shannon Scott is here today with her daughter for follow-up.  Unfortunately, we have a big problem right now.  Somehow, she hurt her lower back.  She did not fall.  She did not twisted.  She was not picking up anything.  She just woke up one morning and she had a lot of pain in the lower back.  There may be some radiation to her thighs bilaterally.  She went to urgent care.  They did x-rays.  They did a CT scan.  The CT scan showed mild central and bilateral foraminal narrowing at L4-5.  There is a broad-based disc protrusion and moderate bilateral facet hypertrophy.  There is no evidence of cancer.  She needs to have an MRI done.  We will see about having that set up for her in the next couple days.  She is taking Percocet.  She also is taking Ultram.  I told her to alternate between the 2 medications.  Again, I am not sure exactly what is going on.  She had a PET scan done in December which did not show any evidence of malignancy.  The PET scan showed that she had responded very nicely to immunotherapy.  Her appetite is down a little bit because of the discomfort.  She has had no cough.  There is been no change in bowel or bladder habits.  She has had no bleeding.  Overall, her performance status is ECOG 1.       Medications:  Allergies as of 06/15/2018      Reactions   Statins Other (See Comments)   Myalgias and memory problems   Ciprofloxacin Itching   Splotchy redness with itching during IV infusion localized to arm.        Medication List       Accurate as of June 15, 2018  5:32 PM. Always use your most recent med list.        acetaminophen 500 MG tablet Commonly known as:  TYLENOL Take 500 mg by mouth every 6 (six) hours as needed for mild pain, moderate pain, fever or headache.   amoxicillin 500 MG tablet Commonly known as:  AMOXIL Take four tablets prior to dental procedure   diclofenac sodium 1 % Gel Commonly known as:  VOLTAREN Apply 2 g topically 4 (four) times daily.   diltiazem 300 MG 24 hr capsule Commonly known as:  TIAZAC TAKE 1 CAPSULE(300 MG) BY MOUTH DAILY   lactose free nutrition Liqd Take 237 mLs by mouth 3 (three) times daily with meals.   levothyroxine 50 MCG tablet Commonly known as:  SYNTHROID, LEVOTHROID Take 1 tablet (50 mcg total) by mouth daily before breakfast.   lidocaine-prilocaine cream Commonly known as:  EMLA Apply 1 application topically as needed.   lipase/protease/amylase 36000 UNITS Cpep capsule Commonly known as:  CREON Take 2 capsules (72,000 Units total) by mouth 3 (three) times daily before meals.   ondansetron 8 MG tablet Commonly known as:  ZOFRAN TAKE 1 TABLET(8 MG) BY MOUTH EVERY 8 HOURS AS NEEDED FOR  NAUSEA OR VOMITING   oxyCODONE-acetaminophen 5-325 MG tablet Commonly known as:  PERCOCET Take 1-2 tablets by mouth every 6 (six) hours as needed.   pantoprazole 40 MG tablet Commonly known as:  PROTONIX Take 1 tablet (40 mg total) by mouth daily.   polyethylene glycol packet Commonly known as:  MIRALAX / GLYCOLAX Take 17 g by mouth daily.   prochlorperazine 10 MG tablet Commonly known as:  COMPAZINE Take 1 tablet (10 mg total) by mouth every 6 (six) hours as needed for nausea or vomiting.   senna-docusate 8.6-50 MG tablet Commonly known as:  Senokot-S Take 1 tablet by mouth 2 (two) times daily.   traMADol 50 MG tablet Commonly known as:  ULTRAM Take 2 tablets (100 mg total) by mouth every 6 (six) hours as needed.        Allergies:  Allergies  Allergen Reactions  . Statins Other (See Comments)    Myalgias and memory problems  . Ciprofloxacin Itching    Splotchy redness with itching during IV infusion localized to arm.    Past Medical History, Surgical history, Social history, and Family History were reviewed and updated.  Review of Systems: Review of Systems  Constitutional: Positive for malaise/fatigue and weight loss.  HENT: Negative.   Eyes: Negative.   Respiratory: Positive for cough.   Cardiovascular: Negative.   Gastrointestinal: Positive for nausea.  Genitourinary: Positive for frequency.  Musculoskeletal: Positive for myalgias.  Skin: Negative.   Neurological: Negative.   Endo/Heme/Allergies: Negative.   Psychiatric/Behavioral: Negative.      Physical Exam:  weight is 151 lb (68.5 kg). Her oral temperature is 98.4 F (36.9 C). Her blood pressure is 139/39 (abnormal) and her pulse is 65. Her respiration is 18 and oxygen saturation is 100%.   Wt Readings from Last 3 Encounters:  06/15/18 151 lb (68.5 kg)  06/13/18 149 lb 7.6 oz (67.8 kg)  05/25/18 149 lb 8 oz (67.8 kg)    Physical Exam Vitals signs reviewed.  HENT:     Head: Normocephalic and atraumatic.  Eyes:     Pupils: Pupils are equal, round, and reactive to light.  Neck:     Musculoskeletal: Normal range of motion.  Cardiovascular:     Rate and Rhythm: Normal rate and regular rhythm.     Comments: Regular rate and rhythm with a normal S1 and S2.  She has a 2/6 systolic ejection murmur. Pulmonary:     Effort: Pulmonary effort is normal.     Breath sounds: Normal breath sounds.  Abdominal:     Comments: Abdomen is soft.  She has a colostomy in the center of her abdomen.  This is actually below the umbilicus.  There is a slight hernia at the colostomy site.  Bowel sounds are active.  There is no guarding or rebound tenderness.  There is no fluid wave.  There is no palpable liver or spleen tip.  Musculoskeletal:  Normal range of motion.        General: No tenderness or deformity.  Lymphadenopathy:     Cervical: No cervical adenopathy.  Skin:    General: Skin is warm and dry.     Findings: No erythema or rash.  Neurological:     Mental Status: She is alert and oriented to person, place, and time.  Psychiatric:        Behavior: Behavior normal.        Thought Content: Thought content normal.        Judgment: Judgment normal.  Lab Results  Component Value Date   WBC 13.0 (H) 06/15/2018   HGB 12.0 06/15/2018   HCT 36.3 06/15/2018   MCV 95.8 06/15/2018   PLT 290 06/15/2018   Lab Results  Component Value Date   FERRITIN 453 (H) 05/25/2018   IRON 68 05/25/2018   TIBC 191 (L) 05/25/2018   UIBC 123 05/25/2018   IRONPCTSAT 36 05/25/2018   Lab Results  Component Value Date   RETICCTPCT 0.5 (L) 10/20/2017   RBC 3.79 (L) 06/15/2018   No results found for: KPAFRELGTCHN, LAMBDASER, KAPLAMBRATIO No results found for: IGGSERUM, IGA, IGMSERUM No results found for: Odetta Pink, SPEI   Chemistry      Component Value Date/Time   NA 137 06/15/2018 1330   NA 136 (A) 09/08/2017   K 3.6 06/15/2018 1330   CL 100 06/15/2018 1330   CO2 27 06/15/2018 1330   BUN 25 (H) 06/15/2018 1330   BUN 8 09/08/2017   CREATININE 0.96 06/15/2018 1330   CREATININE 0.69 08/02/2016 1519   GLU 111 09/08/2017      Component Value Date/Time   CALCIUM 9.6 06/15/2018 1330   ALKPHOS 87 06/15/2018 1330   AST 16 06/15/2018 1330   ALT 16 06/15/2018 1330   BILITOT 0.3 06/15/2018 1330      Impression and Plan: Ms. Mihalic is a very pleasant 78 yo caucasian female with recurrent undifferentiated endometrial carcinoma.   We really need to figure out what is going on with her back.  The MRI should hopefully give [place] an idea.  We really need to refer to spinal surgery.  I will see about getting a referral to Dr. Phylliss Bob of [facility].  I  think that if she does need surgery, she should be able to have surgery.  Hopefully she will not.  If she might need epidural steroids.  I just feel bad that she is done so well and now has this issue to try to deal with.  I will plan to get her back to see me in another 3 weeks.  I spent about 35 minutes with she and her daughter.  All the time spent face-to-face.  This was much more complicated than I would have thought given that she has this back issue that is really affecting her quality of life.      Volanda Napoleon, MD

## 2018-06-15 NOTE — Patient Instructions (Signed)

## 2018-06-16 ENCOUNTER — Telehealth: Payer: Self-pay | Admitting: *Deleted

## 2018-06-16 LAB — TSH: TSH: 2.134 u[IU]/mL (ref 0.308–3.960)

## 2018-06-16 LAB — CA 125: Cancer Antigen (CA) 125: 10.2 U/mL (ref 0.0–38.1)

## 2018-06-16 NOTE — Telephone Encounter (Signed)
This RN called Levada Dy, daughter, and left a message on her cell phone regarding her mother's MRI. MRI has been prior authorized and scheduled for Thursday, 06/22/2018 at 5:00 pm at [facility]. She needs to be at [facility] at 4:30 pm to register. I spoke with [name] at Dr. Laurena Bering office and patient is scheduled there on Friday, 06/23/2018 at 2:15 pm. Dr. Lynann Bologna is to get MRI results before appointment. Instructed Levada Dy to call me back at Dr. Antonieta Pert office to verify she can take her mom to the MRI.

## 2018-06-19 ENCOUNTER — Telehealth: Payer: Self-pay | Admitting: *Deleted

## 2018-06-19 ENCOUNTER — Other Ambulatory Visit: Payer: Self-pay | Admitting: Family

## 2018-06-19 DIAGNOSIS — C539 Malignant neoplasm of cervix uteri, unspecified: Secondary | ICD-10-CM

## 2018-06-19 DIAGNOSIS — C799 Secondary malignant neoplasm of unspecified site: Principal | ICD-10-CM

## 2018-06-19 DIAGNOSIS — M545 Low back pain, unspecified: Secondary | ICD-10-CM

## 2018-06-19 DIAGNOSIS — C541 Malignant neoplasm of endometrium: Secondary | ICD-10-CM

## 2018-06-19 NOTE — Telephone Encounter (Signed)
I called and spoke to Shannon Scott, daughter, and told her I was able to move the MRI from 06/22/18 to 06/21/18. The MRI is at [facility] at 3:00 pm but she needs to be there at 2:15 pm. She verbalized understanding.

## 2018-06-19 NOTE — Telephone Encounter (Signed)
Received call from patient stating,"I'm taking two Percocet tablets every six hours. At the three hour mark, I'm screaming in pain. Is there anything I can do? Return number is [phone]."  Instructed patient there is nothing else to do. Keep taking the Percocet. You have a scan on Thursday, and you see the orthopedic physician on Friday. She verbalized understanding.

## 2018-06-21 ENCOUNTER — Ambulatory Visit (HOSPITAL_COMMUNITY)
Admission: RE | Admit: 2018-06-21 | Discharge: 2018-06-21 | Disposition: A | Discharge status: Home / Self Care | Payer: Medicare Other | Source: Ambulatory Visit | Attending: Hematology & Oncology | Admitting: Hematology & Oncology

## 2018-06-21 DIAGNOSIS — C541 Malignant neoplasm of endometrium: Secondary | ICD-10-CM

## 2018-06-21 MED ORDER — GADOBUTROL 1 MMOL/ML IV SOLN
7.5000 mL | Freq: Once | INTRAVENOUS | Status: AC | PRN
  Administered 2018-06-21: 6 mL via INTRAVENOUS

## 2018-06-22 ENCOUNTER — Ambulatory Visit (HOSPITAL_COMMUNITY): Payer: Medicare Other

## 2018-06-26 ENCOUNTER — Telehealth: Payer: Self-pay | Admitting: *Deleted

## 2018-06-26 ENCOUNTER — Emergency Department (HOSPITAL_COMMUNITY)
Admission: EM | Admit: 2018-06-26 | Discharge: 2018-06-26 | Disposition: A | Discharge status: Home / Self Care | Payer: Medicare Other | Attending: Emergency Medicine | Admitting: Emergency Medicine

## 2018-06-26 ENCOUNTER — Other Ambulatory Visit: Payer: Self-pay

## 2018-06-26 ENCOUNTER — Encounter (HOSPITAL_COMMUNITY): Payer: Self-pay | Admitting: Emergency Medicine

## 2018-06-26 DIAGNOSIS — E039 Hypothyroidism, unspecified: Secondary | ICD-10-CM | POA: Insufficient documentation

## 2018-06-26 DIAGNOSIS — I2510 Atherosclerotic heart disease of native coronary artery without angina pectoris: Secondary | ICD-10-CM | POA: Insufficient documentation

## 2018-06-26 DIAGNOSIS — Z79899 Other long term (current) drug therapy: Secondary | ICD-10-CM | POA: Diagnosis not present

## 2018-06-26 DIAGNOSIS — Z951 Presence of aortocoronary bypass graft: Secondary | ICD-10-CM | POA: Insufficient documentation

## 2018-06-26 DIAGNOSIS — Z933 Colostomy status: Secondary | ICD-10-CM | POA: Diagnosis not present

## 2018-06-26 DIAGNOSIS — J45909 Unspecified asthma, uncomplicated: Secondary | ICD-10-CM | POA: Insufficient documentation

## 2018-06-26 DIAGNOSIS — Z8673 Personal history of transient ischemic attack (TIA), and cerebral infarction without residual deficits: Secondary | ICD-10-CM | POA: Insufficient documentation

## 2018-06-26 DIAGNOSIS — Z8541 Personal history of malignant neoplasm of cervix uteri: Secondary | ICD-10-CM | POA: Insufficient documentation

## 2018-06-26 DIAGNOSIS — I10 Essential (primary) hypertension: Secondary | ICD-10-CM | POA: Diagnosis not present

## 2018-06-26 DIAGNOSIS — M545 Low back pain, unspecified: Secondary | ICD-10-CM

## 2018-06-26 MED ORDER — HYDROMORPHONE HCL 1 MG/ML IJ SOLN
0.5000 mg | Freq: Once | INTRAMUSCULAR | Status: AC
  Administered 2018-06-26: 0.5 mg via INTRAMUSCULAR
  Filled 2018-06-26: qty 1

## 2018-06-26 MED ORDER — METHYLPREDNISOLONE 4 MG PO TBPK: ORAL_TABLET | ORAL | 0 refills | Status: DC

## 2018-06-26 NOTE — ED Provider Notes (Signed)
[place] DEPT Provider Note   CSN: [id number] Arrival date & time: 06/26/18  1422     History   Chief Complaint Chief Complaint  Patient presents with  . Back Pain  . Leg Pain    HPI Shannon Scott is a 78 y.o. female.  HPI Patient is had several weeks of low back pain that radiates to bilateral lower extremities.  Recently had MRI with only mild disc disease and severe osteoarthritis.  Was seen by orthopedics today.  Was not given a steroid injection.  She is been taking Percocet 5/325 1-2 tabs every 6 hours.  States that pain returned after about 2 to 3 hours.  She denies any urinary incontinence.  No new lower extremity weakness or numbness.  Daughter is having difficulty taking care of patient at home due to her pain. Past Medical History:  Diagnosis Date  . Arthritis   . Asthma    allergy induced  . Bilateral carotid artery disease (HCC)    L carotid bruit  . Bursitis    left hip  . CAD (coronary artery disease)   . Cancer ([place])   . Dyslipidemia    intolerant to statins, welchol, niacin, zetia  . Goals of care, counseling/discussion 10/11/2017  . History of blood transfusion   . History of nuclear stress test 04/24/2012   lexiscan; normal study  . Hypertension   . Hypothyroidism   . 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 ([place]) 10/11/2017  . Postoperative nausea and vomiting 01/02/2016    Patient Active Problem List   Diagnosis Date Noted  . IDA (iron deficiency anemia) 10/21/2017  . Abdominal mass   . Deep vein thrombosis (DVT) of left lower extremity ([place])   . Malignant neoplasm of uterus ([place])   . Pelvic mass   . Acute blood loss anemia   . 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 ([place]) 10/11/2017  . Goals of care, counseling/discussion 10/11/2017  . Rectal bleeding 10/03/2017  . Normocytic anemia 10/03/2017  . Left hip pain 10/03/2017  .  Generalized weakness 10/03/2017  . Colostomy status ([place]) 08/30/2017  . Ureteral stent displacement, subsequent encounter 08/30/2017  . Atrial fibrillation with RVR ([place]) 08/30/2017  . Hypophosphatemia 08/30/2017  . Hypomagnesemia 08/30/2017  . Acute blood loss as cause of postoperative anemia 08/30/2017  . Chronic anemia 08/30/2017  . Left leg swelling 08/30/2017  . GERD (gastroesophageal reflux disease) 08/30/2017  . New onset atrial fibrillation ([place])   . Bowel perforation ([place]) 08/18/2017  . Colonic mass 08/18/2017  . Ureteral dilatation 08/18/2017  . Intractable right heel pain 10/07/2016  . Plantar fasciitis of right foot 10/07/2016  . Postoperative nausea and vomiting 01/02/2016  . Hyponatremia 01/01/2016  . Acute hypokalemia 01/01/2016  . Endometrial cancer ([place]) 12/30/2015  . Familial hypercholesteremia 07/28/2015  . Aortic valve stenosis 08/02/2014  . S/P CABG x 3 01/13/2013  . Palpitations 01/13/2013  . Medication intolerance 01/13/2013  . Chronic pain 01/13/2013  . TIA (transient ischemic attack) 01/13/2013  . Carotid stenosis 01/13/2013  . Hypothyroid 10/05/2010  . Essential hypertension 10/05/2010  . Gastroesophagitis 10/05/2010  . Atrophic gastritis 10/05/2010    Past Surgical History:  Procedure Laterality Date  . ABDOMINAL HYSTERECTOMY    . Carotid Doppler  02/2012   40-59% right int carotid artery stenosis; 60-79% L int carotid stenosis; L carotid bruit  . CORONARY ARTERY BYPASS GRAFT  03/12/2004   LIMA to LAD, SVG to  circumflex, SVG to PDA  . CYSTOSCOPY W/ URETERAL STENT PLACEMENT Left 12/06/2017   Procedure: CYSTOSCOPY WITH LEFT URETERAL STENT EXCHANGE;  Surgeon: Irine Seal, MD;  Location: [facility];  Service: Urology;  Laterality: Left;  . CYSTOSCOPY WITH STENT PLACEMENT Bilateral 08/21/2017   Procedure: CYSTOSCOPY WITH STENT PLACEMENT;  Surgeon: Ceasar Mons, MD;  Location: [facility];  Service: Urology;  Laterality: Bilateral;  . IR FLUORO GUIDE  PORT INSERTION RIGHT  10/11/2017  . IR GENERIC HISTORICAL  04/29/2016   IR RADIOLOGIST EVAL & MGMT 04/29/2016 Sandi Mariscal, MD [facility] INTERV RAD  . IR GENERIC HISTORICAL  05/12/2016   IR RADIOLOGIST EVAL & MGMT 05/12/2016 Sandi Mariscal, MD [facility] INTERV RAD  . IR IVC FILTER PLMT / S&I /IMG GUID/MOD SED  10/11/2017  . IR US GUIDE VASC ACCESS RIGHT  10/11/2017  . ROBOTIC ASSISTED TOTAL HYSTERECTOMY WITH BILATERAL SALPINGO OOPHERECTOMY Bilateral 12/30/2015   Procedure: XI ROBOTIC ASSISTED TOTAL HYSTERECTOMY WITH BILATERAL SALPINGO OOPHORECTOMY WITH SENTAL LYMPH NODE BIOPSY;  Surgeon: Nancy Marus, MD;  Location: [facility];  Service: Gynecology;  Laterality: Bilateral;  . TONSILLECTOMY    . TRANSTHORACIC ECHOCARDIOGRAM  04/2007   EF>55%; mild MR; mild-mod TR; mild pulm HTN; mild calcification of aortiv valve leaflets with mild valvular aortic stenosis  . TUBAL LIGATION       OB History   No obstetric history on file.      Home Medications    Prior to Admission medications   Medication Sig Start Date End Date Taking? Authorizing Provider  diclofenac sodium (VOLTAREN) 1 % GEL Apply 2 g topically 4 (four) times daily.   Yes [provider]  diltiazem (TIAZAC) 300 MG 24 hr capsule TAKE 1 CAPSULE(300 MG) BY MOUTH DAILY Patient taking differently: Take 300 mg by mouth daily.  06/13/18  Yes Ennever, Rudell Cobb, MD  ibuprofen (ADVIL,MOTRIN) 200 MG tablet Take 200 mg by mouth daily as needed (back pain).   Yes [provider]  lactose free nutrition (BOOST PLUS) LIQD Take 237 mLs by mouth 3 (three) times daily with meals. 10/16/17  Yes Eugenie Filler, MD  levothyroxine (SYNTHROID, LEVOTHROID) 50 MCG tablet Take 1 tablet (50 mcg total) by mouth daily before breakfast. 10/25/17  Yes Tanner, Lyndon Code., PA-C  lidocaine-prilocaine (EMLA) cream Apply 1 application topically as needed. 10/20/17  Yes Ennever, Rudell Cobb, MD  ondansetron (ZOFRAN) 8 MG tablet TAKE 1 TABLET(8 MG) BY MOUTH EVERY 8 HOURS AS NEEDED  FOR NAUSEA OR VOMITING Patient taking differently: Take 8 mg by mouth every 8 (eight) hours as needed for nausea or vomiting.  06/02/18  Yes Ennever, Rudell Cobb, MD  oxyCODONE-acetaminophen (PERCOCET) 5-325 MG tablet Take 1-2 tablets by mouth every 6 (six) hours as needed. 06/15/18  Yes Volanda Napoleon, MD  pantoprazole (PROTONIX) 40 MG tablet Take 1 tablet (40 mg total) by mouth daily. Patient taking differently: Take 40 mg by mouth daily as needed (acid reflux).  08/27/17  Yes Sheikh, Omair Latif, DO  polyethylene glycol (MIRALAX / GLYCOLAX) packet Take 17 g by mouth daily. 10/16/17  Yes Eugenie Filler, MD  traMADol (ULTRAM) 50 MG tablet Take 2 tablets (100 mg total) by mouth every 6 (six) hours as needed. 06/15/18  Yes Volanda Napoleon, MD  amoxicillin (AMOXIL) 500 MG tablet Take four tablets prior to dental procedure Patient not taking: Reported on 06/26/2018 02/03/18   Volanda Napoleon, MD  lipase/protease/amylase (CREON) 36000 UNITS CPEP capsule Take 2 capsules (72,000 Units total)  by mouth 3 (three) times daily before meals. 12/16/17   Volanda Napoleon, MD  methylPREDNISolone (MEDROL DOSEPAK) 4 MG TBPK tablet Take as per package instructions 06/26/18   Julianne Rice, MD  prochlorperazine (COMPAZINE) 10 MG tablet Take 1 tablet (10 mg total) by mouth every 6 (six) hours as needed for nausea or vomiting. Patient not taking: Reported on 06/26/2018 10/20/17   Volanda Napoleon, MD  senna-docusate (SENOKOT-S) 8.6-50 MG tablet Take 1 tablet by mouth 2 (two) times daily. Patient not taking: Reported on 06/26/2018 10/16/17   Eugenie Filler, MD    Family History Family History  Problem Relation Age of Onset  . Hypertension Mother   . Heart disease Mother        Died in her 57s  . Stroke Father   . Kidney disease Brother   . Heart disease Brother        also HTN, hyperlipidemia  . Heart attack Brother   . Stroke Sister        x2  . Hypertension Sister     Social History Social History    Tobacco Use  . Smoking status: Never Smoker  . Smokeless tobacco: Never Used  Substance Use Topics  . Alcohol use: No  . Drug use: No     Allergies   Statins and Ciprofloxacin   Review of Systems Review of Systems  Constitutional: Negative for chills and fever.  Respiratory: Negative for cough and shortness of breath.   Cardiovascular: Negative for chest pain.  Gastrointestinal: Negative for abdominal pain, diarrhea, nausea and vomiting.  Genitourinary: Negative for dysuria, flank pain and frequency.  Musculoskeletal: Positive for back pain. Negative for myalgias and neck pain.  Skin: Negative for rash and wound.  Neurological: Negative for dizziness, weakness, light-headedness, numbness and headaches.  All other systems reviewed and are negative.    Physical Exam Updated Vital Signs BP (!) 173/52 (BP Location: Right Arm)   Pulse 73   Temp 98.2 F (36.8 C)   Resp 16   Ht 5\' 1"  (1.549 m)   Wt 68.5 kg   SpO2 99%   BMI 28.53 kg/m   Physical Exam Vitals signs and nursing note reviewed.  Constitutional:      General: She is not in acute distress.    Appearance: Normal appearance. She is well-developed. She is not ill-appearing.  HENT:     Head: Normocephalic and atraumatic.  Eyes:     Pupils: Pupils are equal, round, and reactive to light.  Neck:     Musculoskeletal: Normal range of motion and neck supple.  Cardiovascular:     Rate and Rhythm: Normal rate and regular rhythm.  Pulmonary:     Effort: Pulmonary effort is normal.     Breath sounds: Normal breath sounds.  Abdominal:     General: Bowel sounds are normal.     Palpations: Abdomen is soft.     Tenderness: There is no abdominal tenderness. There is no guarding or rebound.     Comments: Colostomy in place  Musculoskeletal: Normal range of motion.        General: Swelling and tenderness present.     Left lower leg: Edema present.     Comments: Patient has midline inferior lumbar and sacral  tenderness to palpation.  No step-offs or deformity.  Negative straight leg raise bilaterally.  2+ edema left lower extremity.  No calf tenderness.  2+ distal pulses in all extremities.  Skin:    General: Skin is  warm and dry.     Capillary Refill: Capillary refill takes less than 2 seconds.     Findings: No erythema or rash.  Neurological:     General: No focal deficit present.     Mental Status: She is alert and oriented to person, place, and time.     Comments: 5/5 motor in bilateral lower extremities.  Sensation fully intact.  No saddle anesthesia.  Psychiatric:        Behavior: Behavior normal.      ED Treatments / Results  Labs (all labs ordered are listed, but only abnormal results are displayed) Labs Reviewed - No data to display  EKG None  Radiology No results found.  Procedures Procedures (including critical care time)  Medications Ordered in ED Medications  HYDROmorphone (DILAUDID) injection 0.5 mg (0.5 mg Intramuscular Given 06/26/18 1832)     Initial Impression / Assessment and Plan / ED Course  I have reviewed the triage vital signs and the nursing notes.  Pertinent labs & imaging results that were available during my care of the patient were reviewed by me and considered in my medical decision making (see chart for details).     Patient is had recent Doppler ultrasound of the left lower extremity with no evidence of DVT.  No red flags for spinal cord or nerve impingement.  Will give IM dose of pain medication and have social work evaluate for possible help with home care.  Social work and case management have consulted on the patient.  Will arrange home evaluation.  Patient is feeling much better after IM injection.  Will discharge home with Medrol Dosepak.  Advised to follow-up closely with her orthopedist.  Return precautions given. Final Clinical Impressions(s) / ED Diagnoses   Final diagnoses:  Acute midline low back pain, unspecified whether sciatica  present    ED Discharge Orders         [place]     06/26/18 1944    Face-to-face encounter (required for Medicare/Medicaid patients)    Comments:  I Julianne Rice certify that this patient is under my care and that I, or a nurse practitioner or physician's assistant working with me, had a face-to-face encounter that meets the physician face-to-face encounter requirements with this patient on 06/26/2018. The encounter with the patient was in whole, or in part for the following medical condition(s) which is the primary reason for home health care (List medical condition): Low back pain   06/26/18 1944    methylPREDNISolone (MEDROL DOSEPAK) 4 MG TBPK tablet     06/26/18 1946           Julianne Rice, MD 06/26/18 1947

## 2018-06-26 NOTE — Discharge Instructions (Addendum)
Follow-up closely with your orthopedist.  Home health will come and evaluate possible needs to care for you at home.

## 2018-06-26 NOTE — ED Triage Notes (Signed)
Pt c/o lower back pain and bilat posterior leg pains for couple weeks that gotten worse over couple days. reports oxycodone isnt helping with pains. Ortho doctor couldn't do any injections today to help with pain.

## 2018-06-26 NOTE — Progress Notes (Signed)
CSW received a call from EDP about help in the home options for the pt/pt's family.  EDP placed a consult for RN CM with an order for face-to-face.  RN CM aware.  CSW will continue to follow for D/C needs.  Alphonse Guild. Morrissa Shein, LCSW, LCAS, CSI Clinical Social Worker Ph: [phone]

## 2018-06-26 NOTE — Telephone Encounter (Signed)
Message received from patient requesting call back. Call placed back to patient and pt states that she is having extreme back pain and is not sure if she can make it to her appt at 1:45PM today with [facility] ortho.  Pt states that she took one Percocet at 0900.  Pt instructed to take another Percocet now and if she is unable to make appt to go to the ER.  Patient's daughter is with her at this time.  Patient appreciative of call back and will attempt to make it to appt with ortho today at 1:45PM.

## 2018-06-28 ENCOUNTER — Telehealth: Payer: Self-pay | Admitting: *Deleted

## 2018-06-28 NOTE — Telephone Encounter (Signed)
Patient notifying the office that she has a red, splotchy rash that is "all over" her body. She states it showed up a few days ago. She denies itching or pain from the red spots. She also states some of them are crusted.   Patient is on the way to her urologist for follow up. I asked her to take a picture of the spots and send them via her My Chart account. She stated she wasn't sure she had time, but would try when she got to her doctors appointment. She stated she may also just have the urologist follow up with the rash.  Will follow up if patient sends photos through [place] or calls back.

## 2018-06-29 ENCOUNTER — Other Ambulatory Visit: Payer: Self-pay | Admitting: Physical Medicine and Rehabilitation

## 2018-06-29 DIAGNOSIS — M47816 Spondylosis without myelopathy or radiculopathy, lumbar region: Secondary | ICD-10-CM

## 2018-07-03 ENCOUNTER — Telehealth: Payer: Self-pay | Admitting: *Deleted

## 2018-07-03 NOTE — Telephone Encounter (Signed)
Message received from patient stating that she would like a call back d/t severe back pain.  Call placed back to patient and patient states that she is in need of pain medicine refill for her back pain.  Patient instructed to call her orthopedic MD for refill of pain medicine.  Pt states that she is awaiting a call back from them now.  She would also like to move her appts from this week to next week.  Pt transferred to scheduler.

## 2018-07-05 ENCOUNTER — Other Ambulatory Visit: Payer: Self-pay | Admitting: Physical Medicine and Rehabilitation

## 2018-07-05 ENCOUNTER — Ambulatory Visit
Admission: RE | Admit: 2018-07-05 | Discharge: 2018-07-05 | Disposition: A | Discharge status: Home / Self Care | Payer: Medicare Other | Source: Ambulatory Visit | Attending: Physical Medicine and Rehabilitation | Admitting: Physical Medicine and Rehabilitation

## 2018-07-05 DIAGNOSIS — M47816 Spondylosis without myelopathy or radiculopathy, lumbar region: Secondary | ICD-10-CM

## 2018-07-05 MED ORDER — IOPAMIDOL (ISOVUE-M 200) INJECTION 41%
1.0000 mL | Freq: Once | INTRAMUSCULAR | Status: AC
  Administered 2018-07-05: 1 mL via INTRA_ARTICULAR

## 2018-07-05 MED ORDER — METHYLPREDNISOLONE ACETATE 40 MG/ML INJ SUSP (RADIOLOG
120.0000 mg | Freq: Once | INTRAMUSCULAR | Status: AC
  Administered 2018-07-05: 120 mg via INTRA_ARTICULAR

## 2018-07-05 NOTE — Discharge Instructions (Signed)

## 2018-07-06 ENCOUNTER — Inpatient Hospital Stay: Payer: Medicare Other

## 2018-07-06 ENCOUNTER — Inpatient Hospital Stay: Payer: Medicare Other | Attending: Hematology & Oncology | Admitting: Hematology & Oncology

## 2018-07-06 DIAGNOSIS — Z79899 Other long term (current) drug therapy: Secondary | ICD-10-CM | POA: Insufficient documentation

## 2018-07-06 DIAGNOSIS — M79604 Pain in right leg: Secondary | ICD-10-CM | POA: Insufficient documentation

## 2018-07-06 DIAGNOSIS — C541 Malignant neoplasm of endometrium: Secondary | ICD-10-CM | POA: Insufficient documentation

## 2018-07-06 DIAGNOSIS — M545 Low back pain: Secondary | ICD-10-CM | POA: Insufficient documentation

## 2018-07-06 DIAGNOSIS — Z933 Colostomy status: Secondary | ICD-10-CM | POA: Insufficient documentation

## 2018-07-06 DIAGNOSIS — Z5112 Encounter for antineoplastic immunotherapy: Secondary | ICD-10-CM | POA: Insufficient documentation

## 2018-07-10 ENCOUNTER — Other Ambulatory Visit: Payer: Self-pay | Admitting: Hematology & Oncology

## 2018-07-17 ENCOUNTER — Telehealth: Payer: Self-pay | Admitting: *Deleted

## 2018-07-17 NOTE — Telephone Encounter (Signed)
Called and moved the patient's appt from 3/19 to 3/5

## 2018-07-18 ENCOUNTER — Encounter (HOSPITAL_COMMUNITY): Payer: Self-pay | Admitting: Emergency Medicine

## 2018-07-18 ENCOUNTER — Emergency Department (HOSPITAL_COMMUNITY): Payer: Medicare Other

## 2018-07-18 ENCOUNTER — Other Ambulatory Visit: Payer: Self-pay

## 2018-07-18 ENCOUNTER — Emergency Department (HOSPITAL_COMMUNITY)
Admission: EM | Admit: 2018-07-18 | Discharge: 2018-07-18 | Disposition: A | Discharge status: Home / Self Care | Payer: Medicare Other | Attending: Emergency Medicine | Admitting: Emergency Medicine

## 2018-07-18 DIAGNOSIS — R079 Chest pain, unspecified: Secondary | ICD-10-CM | POA: Diagnosis present

## 2018-07-18 DIAGNOSIS — Z79899 Other long term (current) drug therapy: Secondary | ICD-10-CM | POA: Insufficient documentation

## 2018-07-18 DIAGNOSIS — I259 Chronic ischemic heart disease, unspecified: Secondary | ICD-10-CM | POA: Insufficient documentation

## 2018-07-18 DIAGNOSIS — J45909 Unspecified asthma, uncomplicated: Secondary | ICD-10-CM | POA: Insufficient documentation

## 2018-07-18 DIAGNOSIS — E039 Hypothyroidism, unspecified: Secondary | ICD-10-CM | POA: Insufficient documentation

## 2018-07-18 DIAGNOSIS — K219 Gastro-esophageal reflux disease without esophagitis: Secondary | ICD-10-CM | POA: Diagnosis not present

## 2018-07-18 DIAGNOSIS — I10 Essential (primary) hypertension: Secondary | ICD-10-CM | POA: Diagnosis not present

## 2018-07-18 LAB — CBC
HCT: 42.4 % (ref 36.0–46.0)
Hemoglobin: 13.7 g/dL (ref 12.0–15.0)
MCH: 30.6 pg (ref 26.0–34.0)
MCHC: 32.3 g/dL (ref 30.0–36.0)
MCV: 94.9 fL (ref 80.0–100.0)
Platelets: 242 10*3/uL (ref 150–400)
RBC: 4.47 MIL/uL (ref 3.87–5.11)
RDW: 13.4 % (ref 11.5–15.5)
WBC: 10.4 10*3/uL (ref 4.0–10.5)
nRBC: 0 % (ref 0.0–0.2)

## 2018-07-18 LAB — BASIC METABOLIC PANEL
Anion gap: 9 (ref 5–15)
BUN: 26 mg/dL — ABNORMAL HIGH (ref 8–23)
CO2: 27 mmol/L (ref 22–32)
Calcium: 9.3 mg/dL (ref 8.9–10.3)
Chloride: 98 mmol/L (ref 98–111)
Creatinine, Ser: 0.83 mg/dL (ref 0.44–1.00)
GFR calc Af Amer: >60 mL/min (ref >60)
GFR calc non Af Amer: >60 mL/min (ref >60)
Glucose, Bld: 119 mg/dL — ABNORMAL HIGH (ref 70–99)
Potassium: 3.6 mmol/L (ref 3.5–5.1)
Sodium: 134 mmol/L — ABNORMAL LOW (ref 135–145)

## 2018-07-18 LAB — I-STAT TROPONIN, ED: Troponin i, poc: 0.02 ng/mL (ref 0.00–0.08)

## 2018-07-18 MED ORDER — ALUM & MAG HYDROXIDE-SIMETH 200-200-20 MG/5ML PO SUSP
30.0000 mL | Freq: Once | ORAL | Status: AC
  Administered 2018-07-18: 30 mL via ORAL
  Filled 2018-07-18: qty 30

## 2018-07-18 NOTE — ED Provider Notes (Signed)
[facility] DEPT Provider Note   CSN: [id number] Arrival date & time: 07/18/18  1732    History   Chief Complaint Chief Complaint  Patient presents with  . Chest Pain    HPI Shannon Scott is a 78 y.o. female.     HPI  She complains of chest discomfort center of her chest, with nausea, onset today.  She denies vomiting.  History of coronary bypass, 2005.  No intervening cardiac problems.  She had lumbar epidural, for bilateral lower leg pain, about 2 weeks ago.  Prior to that she was on a course of prednisone.  She also completed a second course of prednisone, today.  She has a history of yesterday was treated with Protonix, but has not taken it for several months.  She denies fever, chills, cough, chest pain, weakness or dizziness.  There are no other known modifying factors.   Past Medical History:  Diagnosis Date  . Arthritis   . Asthma    allergy induced  . Bilateral carotid artery disease (HCC)    L carotid bruit  . Bursitis    left hip  . CAD (coronary artery disease)   . Cancer ([place])   . Dyslipidemia    intolerant to statins, welchol, niacin, zetia  . Goals of care, counseling/discussion 10/11/2017  . History of blood transfusion   . History of nuclear stress test 04/24/2012   lexiscan; normal study  . Hypertension   . Hypothyroidism   . 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 ([place]) 10/11/2017  . Postoperative nausea and vomiting 01/02/2016    Patient Active Problem List   Diagnosis Date Noted  . IDA (iron deficiency anemia) 10/21/2017  . Abdominal mass   . Deep vein thrombosis (DVT) of left lower extremity ([place])   . Malignant neoplasm of uterus ([place])   . Pelvic mass   . Acute blood loss anemia   . 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 ([place]) 10/11/2017  . Goals of care, counseling/discussion 10/11/2017  . Rectal bleeding 10/03/2017  .  Normocytic anemia 10/03/2017  . Left hip pain 10/03/2017  . Generalized weakness 10/03/2017  . Colostomy status ([place]) 08/30/2017  . Ureteral stent displacement, subsequent encounter 08/30/2017  . Atrial fibrillation with RVR ([place]) 08/30/2017  . Hypophosphatemia 08/30/2017  . Hypomagnesemia 08/30/2017  . Acute blood loss as cause of postoperative anemia 08/30/2017  . Chronic anemia 08/30/2017  . Left leg swelling 08/30/2017  . GERD (gastroesophageal reflux disease) 08/30/2017  . New onset atrial fibrillation ([place])   . Bowel perforation ([place]) 08/18/2017  . Colonic mass 08/18/2017  . Ureteral dilatation 08/18/2017  . Intractable right heel pain 10/07/2016  . Plantar fasciitis of right foot 10/07/2016  . Postoperative nausea and vomiting 01/02/2016  . Hyponatremia 01/01/2016  . Acute hypokalemia 01/01/2016  . Endometrial cancer ([employer]) 12/30/2015  . Familial hypercholesteremia 07/28/2015  . Aortic valve stenosis 08/02/2014  . S/P CABG x 3 01/13/2013  . Palpitations 01/13/2013  . Medication intolerance 01/13/2013  . Chronic pain 01/13/2013  . TIA (transient ischemic attack) 01/13/2013  . Carotid stenosis 01/13/2013  . Hypothyroid 10/05/2010  . Essential hypertension 10/05/2010  . Gastroesophagitis 10/05/2010  . Atrophic gastritis 10/05/2010    Past Surgical History:  Procedure Laterality Date  . ABDOMINAL HYSTERECTOMY    . Carotid Doppler  02/2012   40-59% right int carotid artery stenosis; 60-79% L int carotid stenosis; L carotid bruit  . CORONARY ARTERY BYPASS  GRAFT  03/12/2004   LIMA to LAD, SVG to circumflex, SVG to PDA  . CYSTOSCOPY W/ URETERAL STENT PLACEMENT Left 12/06/2017   Procedure: CYSTOSCOPY WITH LEFT URETERAL STENT EXCHANGE;  Surgeon: Irine Seal, MD;  Location: [facility];  Service: Urology;  Laterality: Left;  . CYSTOSCOPY WITH STENT PLACEMENT Bilateral 08/21/2017   Procedure: CYSTOSCOPY WITH STENT PLACEMENT;  Surgeon: Ceasar Mons, MD;  Location: [facility];   Service: Urology;  Laterality: Bilateral;  . IR FLUORO GUIDE PORT INSERTION RIGHT  10/11/2017  . IR GENERIC HISTORICAL  04/29/2016   IR RADIOLOGIST EVAL & MGMT 04/29/2016 Sandi Mariscal, MD [facility] INTERV RAD  . IR GENERIC HISTORICAL  05/12/2016   IR RADIOLOGIST EVAL & MGMT 05/12/2016 Sandi Mariscal, MD [facility] INTERV RAD  . IR IVC FILTER PLMT / S&I /IMG GUID/MOD SED  10/11/2017  . IR US GUIDE VASC ACCESS RIGHT  10/11/2017  . ROBOTIC ASSISTED TOTAL HYSTERECTOMY WITH BILATERAL SALPINGO OOPHERECTOMY Bilateral 12/30/2015   Procedure: XI ROBOTIC ASSISTED TOTAL HYSTERECTOMY WITH BILATERAL SALPINGO OOPHORECTOMY WITH SENTAL LYMPH NODE BIOPSY;  Surgeon: Nancy Marus, MD;  Location: [facility];  Service: Gynecology;  Laterality: Bilateral;  . TONSILLECTOMY    . TRANSTHORACIC ECHOCARDIOGRAM  04/2007   EF>55%; mild MR; mild-mod TR; mild pulm HTN; mild calcification of aortiv valve leaflets with mild valvular aortic stenosis  . TUBAL LIGATION       OB History   No obstetric history on file.      Home Medications    Prior to Admission medications   Medication Sig Start Date End Date Taking? Authorizing Provider  amoxicillin (AMOXIL) 500 MG tablet Take four tablets prior to dental procedure Patient not taking: Reported on 06/26/2018 02/03/18   Volanda Napoleon, MD  diclofenac sodium (VOLTAREN) 1 % GEL Apply 2 g topically 4 (four) times daily.    [provider]  diltiazem (TIAZAC) 300 MG 24 hr capsule TAKE 1 CAPSULE(300 MG) BY MOUTH DAILY Patient taking differently: Take 300 mg by mouth daily.  06/13/18   Volanda Napoleon, MD  ibuprofen (ADVIL,MOTRIN) 200 MG tablet Take 200 mg by mouth daily as needed (back pain).    [provider]  lactose free nutrition (BOOST PLUS) LIQD Take 237 mLs by mouth 3 (three) times daily with meals. 10/16/17   Eugenie Filler, MD  levothyroxine (SYNTHROID, LEVOTHROID) 50 MCG tablet Take 1 tablet (50 mcg total) by mouth daily before breakfast. 10/25/17   Tanner, Lyndon Code.,  PA-C  lidocaine-prilocaine (EMLA) cream Apply 1 application topically as needed. 10/20/17   Volanda Napoleon, MD  lipase/protease/amylase (CREON) 36000 UNITS CPEP capsule Take 2 capsules (72,000 Units total) by mouth 3 (three) times daily before meals. 12/16/17   Volanda Napoleon, MD  methylPREDNISolone (MEDROL DOSEPAK) 4 MG TBPK tablet Take as per package instructions 06/26/18   Julianne Rice, MD  ondansetron (ZOFRAN) 8 MG tablet TAKE 1 TABLET(8 MG) BY MOUTH EVERY 8 HOURS AS NEEDED FOR NAUSEA OR VOMITING 07/10/18   Volanda Napoleon, MD  oxyCODONE-acetaminophen (PERCOCET) 5-325 MG tablet Take 1-2 tablets by mouth every 6 (six) hours as needed. 06/15/18   Volanda Napoleon, MD  pantoprazole (PROTONIX) 40 MG tablet Take 1 tablet (40 mg total) by mouth daily. Patient taking differently: Take 40 mg by mouth daily as needed (acid reflux).  08/27/17   Sheikh, Georgina Quint Latif, DO  polyethylene glycol [facility] / GLYCOLAX) packet Take 17 g by mouth daily. 10/16/17   Eugenie Filler, MD  prochlorperazine (  COMPAZINE) 10 MG tablet Take 1 tablet (10 mg total) by mouth every 6 (six) hours as needed for nausea or vomiting. Patient not taking: Reported on 06/26/2018 10/20/17   Volanda Napoleon, MD  senna-docusate (SENOKOT-S) 8.6-50 MG tablet Take 1 tablet by mouth 2 (two) times daily. Patient not taking: Reported on 06/26/2018 10/16/17   Eugenie Filler, MD  traMADol Veatrice Bourbon) 50 MG tablet Take 2 tablets (100 mg total) by mouth every 6 (six) hours as needed. 06/15/18   Volanda Napoleon, MD    Family History Family History  Problem Relation Age of Onset  . Hypertension Mother   . Heart disease Mother        Died in her 58s  . Stroke Father   . Kidney disease Brother   . Heart disease Brother        also HTN, hyperlipidemia  . Heart attack Brother   . Stroke Sister        x2  . Hypertension Sister     Social History Social History   Tobacco Use  . Smoking status: Never Smoker  . Smokeless tobacco: Never  Used  Substance Use Topics  . Alcohol use: No  . Drug use: No     Allergies   Statins and Ciprofloxacin   Review of Systems Review of Systems  All other systems reviewed and are negative.    Physical Exam Updated Vital Signs BP (!) 185/68   Pulse 78   Temp 98.7 F (37.1 C) (Oral)   Resp 14   SpO2 97%   Physical Exam Vitals signs and nursing note reviewed.  Constitutional:      General: She is not in acute distress.    Appearance: She is well-developed and normal weight. She is not ill-appearing, toxic-appearing or diaphoretic.     Comments: Elderly, frail  HENT:     Head: Normocephalic and atraumatic.  Eyes:     Conjunctiva/sclera: Conjunctivae normal.     Pupils: Pupils are equal, round, and reactive to light.  Neck:     Musculoskeletal: Normal range of motion and neck supple.     Trachea: Phonation normal.  Cardiovascular:     Rate and Rhythm: Normal rate and regular rhythm.  Pulmonary:     Effort: Pulmonary effort is normal. No respiratory distress.     Breath sounds: Normal breath sounds. No stridor.  Chest:     Chest wall: No tenderness.  Abdominal:     General: There is no distension.     Palpations: Abdomen is soft.     Tenderness: There is no abdominal tenderness. There is no guarding.  Musculoskeletal: Normal range of motion.     Right lower leg: No edema.     Left lower leg: No edema.  Skin:    General: Skin is warm and dry.  Neurological:     Mental Status: She is alert and oriented to person, place, and time.     Motor: No abnormal muscle tone.  Psychiatric:        Behavior: Behavior normal.        Thought Content: Thought content normal.        Judgment: Judgment normal.      ED Treatments / Results  Labs (all labs ordered are listed, but only abnormal results are displayed) Labs Reviewed  BASIC METABOLIC PANEL - Abnormal; Notable for the following components:      Result Value   Sodium 134 (*)    Glucose, Bld 119 (*)  BUN 26  (*)    All other components within normal limits  CBC  I-STAT TROPONIN, ED    EKG EKG Interpretation  Date/Time:  Tuesday July 18 2018 17:47:29 EST Ventricular Rate:  76 PR Interval:    QRS Duration: 103 QT Interval:  381 QTC Calculation: 429 R Axis:   52 Text Interpretation:  Sinus arrhythmia Probable left atrial enlargement Probable inferior infarct, old Consider anterolateral infarct Since last tracing of earlier today No significant change was found Confirmed by Daleen Bo [id number]) on 07/18/2018 6:04:38 PM   Radiology Dg Chest 2 View  Result Date: 07/18/2018 CLINICAL DATA:  Chest pain EXAM: CHEST - 2 VIEW COMPARISON:  12/26/2015, PET-CT 06/05/2018 FINDINGS: Right-sided central venous port tip over the SVC. Post sternotomy changes. No acute consolidation or pleural effusion. Cardiomediastinal silhouette within normal limits. Aortic atherosclerosis. No pneumothorax. IVC filter in the upper abdomen. IMPRESSION: No active cardiopulmonary disease. Electronically Signed   By: Donavan Foil M.D.   On: 07/18/2018 19:07    Procedures Procedures (including critical care time)  Medications Ordered in ED Medications  alum & mag hydroxide-simeth (MAALOX/MYLANTA) 200-200-20 MG/5ML suspension 30 mL (30 mLs Oral Given 07/18/18 2308)     Initial Impression / Assessment and Plan / ED Course  I have reviewed the triage vital signs and the nursing notes.  Pertinent labs & imaging results that were available during my care of the patient were reviewed by me and considered in my medical decision making (see chart for details).         Patient Vitals for the past 24 hrs:  BP Temp Temp src Pulse Resp SpO2  07/18/18 2300 (!) 185/68 - - 78 14 97 %  07/18/18 2206 (!) 162/59 - - 76 19 95 %  07/18/18 1744 (!) 172/77 98.7 F (37.1 C) Oral 76 20 99 %    11:27 PM Reevaluation with update and discussion. After initial assessment and treatment, an updated evaluation reveals no change in  status, findings discussed and questions answered. Daleen Bo   Medical Decision Making: Evaluation consistent with GERD; doubt ACS, PE or pneumonia.  Occasion for further ED treatment or evaluation at this time  CRITICAL CARE-no Performed by: Daleen Bo  Nursing Notes Reviewed/ Care Coordinated Applicable Imaging Reviewed Interpretation of Laboratory Data incorporated into ED treatment  The patient appears reasonably screened and/or stabilized for discharge and I doubt any other medical condition or other [facility] requiring further screening, evaluation, or treatment in the ED at this time prior to discharge.  Plan: Home Medications-continue usual medication, restart Protonix, take for at least 1 month supplement with antacids AC at bedtime for 1 week.; Home Treatments-gradually [place] diet and activity.; return here if the recommended treatment, does not improve the symptoms; Recommended follow up-ECP checkup 1 week and as needed   Final Clinical Impressions(s) / ED Diagnoses   Final diagnoses:  Gastroesophageal reflux disease, esophagitis presence not specified    ED Discharge Orders    None       Daleen Bo, MD 07/18/18 2327

## 2018-07-18 NOTE — ED Triage Notes (Signed)
Pt c.o chest pains with SOB and nausea since last night.

## 2018-07-18 NOTE — Discharge Instructions (Addendum)
Take Maalox, 2 tablespoons, before meals and at bedtime, for 1 week.  Start taking your Protonix, today and take it once or twice a day.  Return here if needed for problems.

## 2018-07-19 ENCOUNTER — Telehealth: Payer: Self-pay

## 2018-07-19 NOTE — Telephone Encounter (Signed)
Returned patient's call to our office from late yesterday afternoon.  She states she still is in pain after bilateral facet joint injections on 07/05/18.  I explained that we don't initiate care here; that she needs to call her referring physician and let them know what is going on with her back/legs/hip.  She said she would give them a call shortly.  I did remind her we cannot do another facet injection at those levels for another six weeks, though there may be other options Dr. Mina Marble might consider for her.  Gypsy Lore, RN

## 2018-07-20 ENCOUNTER — Telehealth: Payer: Self-pay | Admitting: *Deleted

## 2018-07-20 NOTE — Telephone Encounter (Signed)
Message received from patient requesting a call back regarding pain.  Call placed back to patient and patient states that pain continues to back and legs and that she is having a difficult time urinating.  She states that she spoke with her orthopedic MD office this am and they have scheduled her to see a urologist this afternoon.  Pt appreciative of call back and is aware of appt next week, 07/27/18 with Dr. Marin Olp.

## 2018-07-27 ENCOUNTER — Inpatient Hospital Stay (HOSPITAL_BASED_OUTPATIENT_CLINIC_OR_DEPARTMENT_OTHER): Payer: Medicare Other | Admitting: Hematology & Oncology

## 2018-07-27 ENCOUNTER — Inpatient Hospital Stay: Payer: Medicare Other

## 2018-07-27 ENCOUNTER — Encounter: Payer: Self-pay | Admitting: Hematology & Oncology

## 2018-07-27 ENCOUNTER — Other Ambulatory Visit: Payer: Self-pay

## 2018-07-27 VITALS — BP 140/45 | HR 71 | Resp 17

## 2018-07-27 VITALS — BP 170/66 | HR 81 | Temp 98.6°F | Resp 17 | Wt 141.5 lb

## 2018-07-27 DIAGNOSIS — Z79899 Other long term (current) drug therapy: Secondary | ICD-10-CM

## 2018-07-27 DIAGNOSIS — C541 Malignant neoplasm of endometrium: Secondary | ICD-10-CM

## 2018-07-27 DIAGNOSIS — C799 Secondary malignant neoplasm of unspecified site: Principal | ICD-10-CM

## 2018-07-27 DIAGNOSIS — Z933 Colostomy status: Secondary | ICD-10-CM | POA: Diagnosis not present

## 2018-07-27 DIAGNOSIS — Z5112 Encounter for antineoplastic immunotherapy: Secondary | ICD-10-CM

## 2018-07-27 DIAGNOSIS — D508 Other iron deficiency anemias: Secondary | ICD-10-CM

## 2018-07-27 DIAGNOSIS — C539 Malignant neoplasm of cervix uteri, unspecified: Secondary | ICD-10-CM

## 2018-07-27 DIAGNOSIS — M79604 Pain in right leg: Secondary | ICD-10-CM | POA: Diagnosis not present

## 2018-07-27 DIAGNOSIS — M545 Low back pain: Secondary | ICD-10-CM

## 2018-07-27 LAB — CBC WITH DIFFERENTIAL (CANCER CENTER ONLY)
Abs Immature Granulocytes: 0.09 10*3/uL — ABNORMAL HIGH (ref 0.00–0.07)
Basophils Absolute: 0 10*3/uL (ref 0.0–0.1)
Basophils Relative: 0 %
Eosinophils Absolute: 0 10*3/uL (ref 0.0–0.5)
Eosinophils Relative: 0 %
HCT: 37.8 % (ref 36.0–46.0)
Hemoglobin: 12.8 g/dL (ref 12.0–15.0)
Immature Granulocytes: 1 %
Lymphocytes Relative: 4 %
Lymphs Abs: 0.4 10*3/uL — ABNORMAL LOW (ref 0.7–4.0)
MCH: 32.2 pg (ref 26.0–34.0)
MCHC: 33.9 g/dL (ref 30.0–36.0)
MCV: 95.2 fL (ref 80.0–100.0)
Monocytes Absolute: 0.6 10*3/uL (ref 0.1–1.0)
Monocytes Relative: 7 %
Neutro Abs: 7.6 10*3/uL (ref 1.7–7.7)
Neutrophils Relative %: 88 %
Platelet Count: 267 10*3/uL (ref 150–400)
RBC: 3.97 MIL/uL (ref 3.87–5.11)
RDW: 13.2 % (ref 11.5–15.5)
WBC Count: 8.7 10*3/uL (ref 4.0–10.5)
nRBC: 0 % (ref 0.0–0.2)

## 2018-07-27 LAB — CMP (CANCER CENTER ONLY)
ALT: 22 U/L (ref 0–44)
AST: 20 U/L (ref 15–41)
Albumin: 4.1 g/dL (ref 3.5–5.0)
Alkaline Phosphatase: 111 U/L (ref 38–126)
Anion gap: 9 (ref 5–15)
BUN: 16 mg/dL (ref 8–23)
CO2: 28 mmol/L (ref 22–32)
Calcium: 9.5 mg/dL (ref 8.9–10.3)
Chloride: 98 mmol/L (ref 98–111)
Creatinine: 0.85 mg/dL (ref 0.44–1.00)
GFR, Est AFR Am: >60 mL/min (ref >60)
GFR, Estimated: >60 mL/min (ref >60)
Glucose, Bld: 147 mg/dL — ABNORMAL HIGH (ref 70–99)
Potassium: 3.7 mmol/L (ref 3.5–5.1)
Sodium: 135 mmol/L (ref 135–145)
Total Bilirubin: 0.4 mg/dL (ref 0.3–1.2)
Total Protein: 7 g/dL (ref 6.5–8.1)

## 2018-07-27 MED ORDER — HEPARIN SOD (PORK) LOCK FLUSH 100 UNIT/ML IV SOLN
500.0000 [IU] | Freq: Once | INTRAVENOUS | Status: AC | PRN
  Administered 2018-07-27: 500 [IU]
  Filled 2018-07-27: qty 5

## 2018-07-27 MED ORDER — KETOROLAC TROMETHAMINE 15 MG/ML IJ SOLN
30.0000 mg | Freq: Once | INTRAMUSCULAR | Status: AC
  Administered 2018-07-27: 30 mg via INTRAVENOUS
  Filled 2018-07-27: qty 2

## 2018-07-27 MED ORDER — SODIUM CHLORIDE 0.9 % IV SOLN
200.0000 mg | Freq: Once | INTRAVENOUS | Status: AC
  Administered 2018-07-27: 200 mg via INTRAVENOUS
  Filled 2018-07-27: qty 8

## 2018-07-27 MED ORDER — SODIUM CHLORIDE 0.9 % IV SOLN
Freq: Once | INTRAVENOUS | Status: DC
  Filled 2018-07-27: qty 250

## 2018-07-27 MED ORDER — KETOROLAC TROMETHAMINE 15 MG/ML IJ SOLN
INTRAMUSCULAR | Status: AC
  Filled 2018-07-27: qty 2

## 2018-07-27 MED ORDER — SODIUM CHLORIDE 0.9 % IV SOLN
INTRAVENOUS | Status: AC
  Administered 2018-07-27: 13:00:00 via INTRAVENOUS
  Filled 2018-07-27 (×2): qty 250

## 2018-07-27 MED ORDER — GABAPENTIN 100 MG PO CAPS: 100.0000 mg | ORAL_CAPSULE | Freq: Four times a day (QID) | ORAL | 3 refills | Status: DC

## 2018-07-27 MED ORDER — SODIUM CHLORIDE 0.9% FLUSH
10.0000 mL | INTRAVENOUS | Status: DC | PRN
  Administered 2018-07-27: 10 mL
  Filled 2018-07-27: qty 10

## 2018-07-27 NOTE — Addendum Note (Signed)
Addended by: Burney Gauze R on: 07/27/2018 12:34 PM   Modules accepted: Orders

## 2018-07-27 NOTE — Patient Instructions (Signed)
Cancer Center Discharge Instructions for Patients Receiving Chemotherapy  Today you received the following chemotherapy agents: Keytruda   To help prevent nausea and vomiting after your treatment, we encourage you to take your nausea medication as directed    If you develop nausea and vomiting that is not controlled by your nausea medication, call the clinic.   BELOW ARE SYMPTOMS THAT SHOULD BE REPORTED IMMEDIATELY:  *FEVER GREATER THAN 100.5 F  *CHILLS WITH OR WITHOUT FEVER  NAUSEA AND VOMITING THAT IS NOT CONTROLLED WITH YOUR NAUSEA MEDICATION  *UNUSUAL SHORTNESS OF BREATH  *UNUSUAL BRUISING OR BLEEDING  TENDERNESS IN MOUTH AND THROAT WITH OR WITHOUT PRESENCE OF ULCERS  *URINARY PROBLEMS  *BOWEL PROBLEMS  UNUSUAL RASH Items with * indicate a potential emergency and should be followed up as soon as possible.  Feel free to call the clinic you have any questions or concerns. The clinic phone number is [phone].  Please show the CHEMO ALERT CARD at check-in to the Emergency Department and triage nurse.   

## 2018-07-27 NOTE — Progress Notes (Signed)
Hematology and Oncology Follow Up Visit  Shannon Scott [id number] 1940-12-14 78 y.o. 07/27/2018   Principle Diagnosis:  Locally advanced/recurrent endometrial carcinoma undifferentiated --  TMB (HIGH) / MSI HIGH  Current Therapy:   Radiation therapy for 5 -5 1/2 weeks - s/p 20 fractions Taxol/carboplatinumq 7 days -- s/p cycle #6 Pembrolizumab 200 mg IV q 3 wk -- s/p cycle #5 IV iron as indicated - last received 11/10/2017   Interim History: Shannon Scott is here today with Shannon Scott daughter for follow-up.  Shannon Scott still is absolutely debilitated by this lower back issue.  I just am not sure what else we can do about this.  Shannon Scott is seen orthopedic surgery.  Shannon Scott has had injections.  I have to believe that Shannon Scott still has some type of disc herniation.  Shannon Scott has pain down the right leg.  Is down the back of the leg.  I told Shannon Scott that I probably would consider doing a myelogram.  Shannon Scott sees the doctor next week.  Shannon Scott will bring this up.  I know that myelograms are really not done that much anymore but yet it could certainly give an answer as to why Shannon Scott is having so much pain.  Shannon Scott cannot tolerate narcotic pain medications.  Shannon Scott is not on Neurontin.  I am surprised by this.  I will see about getting Shannon Scott on Neurontin and see if this helps a little bit.  As far as Shannon Scott endometrial cancer is concerned, this really is not an issue.  Shannon Scott has done well with the pembrolizumab.  Shannon Scott last PET scan that was done in early January did not show any obvious metastatic disease.  Shannon Scott did have a mediastinal lymph node which would be highly unusual for endometrial cancer.  Overall, I would say that Shannon Scott performance status right now is ECOG 2.       Medications:  Allergies as of 07/27/2018      Reactions   Statins Other (See Comments)   Myalgias and memory problems   Ciprofloxacin Itching   Splotchy redness with itching during IV infusion localized to arm.      Medication List       Accurate as of July 27, 2018 12:04 PM. Always use your most recent med list.        amoxicillin 500 MG tablet Commonly known as:  AMOXIL Take four tablets prior to dental procedure   diclofenac sodium 1 % Gel Commonly known as:  VOLTAREN Apply 2 g topically 4 (four) times daily.   diltiazem 300 MG 24 hr capsule Commonly known as:  TIAZAC TAKE 1 CAPSULE(300 MG) BY MOUTH DAILY   ibuprofen 200 MG tablet Commonly known as:  ADVIL,MOTRIN Take 200 mg by mouth daily as needed (back pain).   lactose free nutrition Liqd Take 237 mLs by mouth 3 (three) times daily with meals.   levothyroxine 50 MCG tablet Commonly known as:  SYNTHROID, LEVOTHROID Take 1 tablet (50 mcg total) by mouth daily before breakfast.   lidocaine-prilocaine cream Commonly known as:  EMLA Apply 1 application topically as needed.   lipase/protease/amylase 36000 UNITS Cpep capsule Commonly known as:  CREON Take 2 capsules (72,000 Units total) by mouth 3 (three) times daily before meals.   methylPREDNISolone 4 MG Tbpk tablet Commonly known as:  MEDROL DOSEPAK Take as per package instructions   ondansetron 8 MG tablet Commonly known as:  ZOFRAN TAKE 1 TABLET(8 MG) BY MOUTH EVERY 8 HOURS AS NEEDED FOR NAUSEA OR VOMITING  oxyCODONE-acetaminophen 5-325 MG tablet Commonly known as:  PERCOCET Take 1-2 tablets by mouth every 6 (six) hours as needed.   pantoprazole 40 MG tablet Commonly known as:  PROTONIX Take 1 tablet (40 mg total) by mouth daily.   polyethylene glycol packet Commonly known as:  MIRALAX / GLYCOLAX Take 17 g by mouth daily.   prochlorperazine 10 MG tablet Commonly known as:  COMPAZINE Take 1 tablet (10 mg total) by mouth every 6 (six) hours as needed for nausea or vomiting.   senna-docusate 8.6-50 MG tablet Commonly known as:  Senokot-S Take 1 tablet by mouth 2 (two) times daily.   traMADol 50 MG tablet Commonly known as:  ULTRAM Take 2 tablets (100 mg total) by mouth every 6 (six) hours as needed.        Allergies:  Allergies  Allergen Reactions  . Statins Other (See Comments)    Myalgias and memory problems  . Ciprofloxacin Itching    Splotchy redness with itching during IV infusion localized to arm.    Past Medical History, Surgical history, Social history, and Family History were reviewed and updated.  Review of Systems: Review of Systems  Constitutional: Positive for malaise/fatigue and weight loss.  HENT: Negative.   Eyes: Negative.   Respiratory: Positive for cough.   Cardiovascular: Negative.   Gastrointestinal: Positive for nausea.  Genitourinary: Positive for frequency.  Musculoskeletal: Positive for myalgias.  Skin: Negative.   Neurological: Negative.   Endo/Heme/Allergies: Negative.   Psychiatric/Behavioral: Negative.      Physical Exam:  weight is 141 lb 8 oz (64.2 kg). Shannon Scott oral temperature is 98.6 F (37 C). Shannon Scott blood pressure is 170/66 (abnormal) and Shannon Scott pulse is 81. Shannon Scott respiration is 17 and oxygen saturation is 97%.   Wt Readings from Last 3 Encounters:  07/27/18 141 lb 8 oz (64.2 kg)  06/26/18 151 lb 0.2 oz (68.5 kg)  06/15/18 151 lb (68.5 kg)    Physical Exam Vitals signs reviewed.  HENT:     Head: Normocephalic and atraumatic.  Eyes:     Pupils: Pupils are equal, round, and reactive to light.  Neck:     Musculoskeletal: Normal range of motion.  Cardiovascular:     Rate and Rhythm: Normal rate and regular rhythm.     Comments: Regular rate and rhythm with a normal S1 and S2.  Shannon Scott has a 2/6 systolic ejection murmur. Pulmonary:     Effort: Pulmonary effort is normal.     Breath sounds: Normal breath sounds.  Abdominal:     Comments: Abdomen is soft.  Shannon Scott has a colostomy in the center of Shannon Scott abdomen.  This is actually below the umbilicus.  There is a slight hernia at the colostomy site.  Bowel sounds are active.  There is no guarding or rebound tenderness.  There is no fluid wave.  There is no palpable liver or spleen tip.  Musculoskeletal:  Normal range of motion.        General: No tenderness or deformity.  Lymphadenopathy:     Cervical: No cervical adenopathy.  Skin:    General: Skin is warm and dry.     Findings: No erythema or rash.  Neurological:     Mental Status: Shannon Scott is alert and oriented to person, place, and time.  Psychiatric:        Behavior: Behavior normal.        Thought Content: Thought content normal.        Judgment: Judgment normal.  Lab Results  Component Value Date   WBC 8.7 07/27/2018   HGB 12.8 07/27/2018   HCT 37.8 07/27/2018   MCV 95.2 07/27/2018   PLT 267 07/27/2018   Lab Results  Component Value Date   FERRITIN 453 (H) 05/25/2018   IRON 68 05/25/2018   TIBC 191 (L) 05/25/2018   UIBC 123 05/25/2018   IRONPCTSAT 36 05/25/2018   Lab Results  Component Value Date   RETICCTPCT 0.5 (L) 10/20/2017   RBC 3.97 07/27/2018   No results found for: KPAFRELGTCHN, LAMBDASER, KAPLAMBRATIO No results found for: IGGSERUM, IGA, IGMSERUM No results found for: Odetta Pink, SPEI   Chemistry      Component Value Date/Time   NA 135 07/27/2018 1100   NA 136 (A) 09/08/2017   K 3.7 07/27/2018 1100   CL 98 07/27/2018 1100   CO2 28 07/27/2018 1100   BUN 16 07/27/2018 1100   BUN 8 09/08/2017   CREATININE 0.85 07/27/2018 1100   CREATININE 0.69 08/02/2016 1519   GLU 111 09/08/2017      Component Value Date/Time   CALCIUM 9.5 07/27/2018 1100   ALKPHOS 111 07/27/2018 1100   AST 20 07/27/2018 1100   ALT 22 07/27/2018 1100   BILITOT 0.4 07/27/2018 1100      Impression and Plan: Shannon Scott is a very pleasant 78 yo caucasian female with recurrent undifferentiated endometrial carcinoma.   Again, Shannon Scott main clinical issue right now is Shannon Scott back.  I really think that Shannon Scott needs a myelogram.  I think this would be a relatively safe procedure for Shannon Scott.  Again it might help provide some additional information to the orthopedist as to why Shannon Scott is  having so much problems.  I do not think we need to do another PET scan until April or May.  I am not sure what this mediastinal node is.  Again I think it would be highly unusual for endometrial cancer to spread up to the mediastinum.  We will see Shannon Scott back in another 3 weeks.  Hopefully Shannon Scott will be able to walk in and not be suffering.  I will try Shannon Scott on some Neurontin.  Volanda Napoleon, MD

## 2018-07-28 LAB — IRON AND TIBC
Iron: 53 ug/dL (ref 41–142)
Saturation Ratios: 25 % (ref 21–57)
TIBC: 214 ug/dL — ABNORMAL LOW (ref 236–444)
UIBC: 160 ug/dL (ref 120–384)

## 2018-07-28 LAB — FERRITIN: Ferritin: 872 ng/mL — ABNORMAL HIGH (ref 11–307)

## 2018-08-03 ENCOUNTER — Ambulatory Visit: Payer: Medicare Other | Admitting: Gynecologic Oncology

## 2018-08-03 ENCOUNTER — Encounter: Payer: Self-pay | Admitting: Gynecologic Oncology

## 2018-08-03 ENCOUNTER — Other Ambulatory Visit: Payer: Self-pay

## 2018-08-03 ENCOUNTER — Inpatient Hospital Stay: Payer: Medicare Other | Attending: Hematology & Oncology | Admitting: Gynecologic Oncology

## 2018-08-03 VITALS — BP 154/54 | HR 72 | Temp 98.3°F | Resp 18 | Ht 61.0 in | Wt 141.0 lb

## 2018-08-03 DIAGNOSIS — E039 Hypothyroidism, unspecified: Secondary | ICD-10-CM | POA: Insufficient documentation

## 2018-08-03 DIAGNOSIS — Z933 Colostomy status: Secondary | ICD-10-CM | POA: Diagnosis not present

## 2018-08-03 DIAGNOSIS — R600 Localized edema: Secondary | ICD-10-CM

## 2018-08-03 DIAGNOSIS — C541 Malignant neoplasm of endometrium: Secondary | ICD-10-CM | POA: Diagnosis present

## 2018-08-03 DIAGNOSIS — Z923 Personal history of irradiation: Secondary | ICD-10-CM | POA: Diagnosis not present

## 2018-08-03 DIAGNOSIS — Z170 Estrogen receptor positive status [ER+]: Secondary | ICD-10-CM | POA: Insufficient documentation

## 2018-08-03 DIAGNOSIS — Z951 Presence of aortocoronary bypass graft: Secondary | ICD-10-CM | POA: Diagnosis not present

## 2018-08-03 DIAGNOSIS — I10 Essential (primary) hypertension: Secondary | ICD-10-CM | POA: Diagnosis not present

## 2018-08-03 DIAGNOSIS — Z9221 Personal history of antineoplastic chemotherapy: Secondary | ICD-10-CM

## 2018-08-03 DIAGNOSIS — Z90722 Acquired absence of ovaries, bilateral: Secondary | ICD-10-CM | POA: Diagnosis not present

## 2018-08-03 DIAGNOSIS — M25551 Pain in right hip: Secondary | ICD-10-CM | POA: Diagnosis not present

## 2018-08-03 DIAGNOSIS — I2510 Atherosclerotic heart disease of native coronary artery without angina pectoris: Secondary | ICD-10-CM | POA: Insufficient documentation

## 2018-08-03 DIAGNOSIS — Z90710 Acquired absence of both cervix and uterus: Secondary | ICD-10-CM | POA: Insufficient documentation

## 2018-08-03 DIAGNOSIS — E785 Hyperlipidemia, unspecified: Secondary | ICD-10-CM | POA: Diagnosis not present

## 2018-08-03 NOTE — Patient Instructions (Signed)
You will have an ultrasound of your left lower leg to evaluate if there is a blood clot causing the swelling in the leg. You will see Dr. Skeet Latch in 3 months for follow up.

## 2018-08-03 NOTE — Progress Notes (Signed)
GYN ONCOLOGY OFFICE VISIT    Shannon Scott 78 y.o. female  CC: Recurrent endometrial cancer.    Assessment/Plan:  [name]  is a 78 y.o.  year old with Stage IA grade 2 endometrioid adenocarcinoma.  Status post surgical staging  12/30/2015. She only had 30% depth of myometrial invasion, no lymphovascular space involvement, and low risk factors for recurrence therefore adjuvant therapy not recommended in accordance to NCCN guidelines.   In March 2019 she presented with a bowel perforation and distal rectosigmoid obstruction.  Is also evidence of left ureteral compression.  She underwent a transverse diverting colostomy and subsequent placement of ureteral stent.  She completed external beam radiotherapy in addition to Taxol and carboplatin for 6 cycles.     Recurrence to the left pelvic sidewall was identified.  Molecular assessment of the tumor indicates that this is an MSI high tumor that is ER positive.  She has completed 6 cycles of immunotherapy pembrolizumab. S/p  Cycle 4 Imaging 05/2018 notable for response in the size of the left pelvic sidewall mass and lymph node.  Hypermetabolism of the thoracic region. Rec follow-up PET 4/202  R Hip pain. PET 1/202 did not note skeletal abnormalities.  MRI pelvis scheduled.  [name] is under the care of an orthopedic surgeon  Parastomal hernia The colostomy is functioning well however there are difficulties with application of the appliance with resulting skin breakdown.  Will refer to wound care or to [facility] surgical team.  Left lower extremity edema [name] has noted persistent  Edema of the LLE.  History notable for DVT .  Will obtain Doppler studies - again-  to rule out DVT If negative will refer to lyphedema clinic  Follow-up with Dr. Marin Olp Follow-up with [place] in 3 months.    HPI: Shannon Scott is a 78 y.o.  old G2P2 who was initially seen in our practice by Dr. Alycia Rossetti or grade  1-2 endometrial cancer in 2017.  The patient began noticing postmenopausal bleeding in June, 2017. She saw her primary care physician who determined there was no blood in the urine, and she was then referred to Dr Benjie Karvonen who, on 11/21/15 performed a TVUS which showed a uterus measuring 6x2.7x3.3cm with normal ovaries. There was a thickened endometrium of 6.71m. A pipelle endometrial biopsy was performed on 11/21/15 which showed FIGO grade 1-2 endometrial cancer.  The pap from the same date was normal.  The patient has a history of familial hypercholesterolemia. She has a history of a 3 vessel CABG in 2005. She has never had a CVA, and recent carotid dopplers show 50% occlusion. She reports symptoms concerning for LE claudication with ambulation and stairs. .  On 12/30/15 she underwent robotic assisted total hysterectomy, BSO, pelvic and PA SLN biopsy. 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  Postoperative Diagnosis: same.  Surgery: Total robotic hysterectomy bilateral salpingo-oophorectomy, left pelvic, right para-aortic and bifurcation SLN removal  Operative findings:  1) Colon adherent to posterior uterus, left sidewall and appendix 2) 2 right para-aortic nodes for SLN (high and PA node) 3) Aortic bifurcation nodes 4) Left obturator node  Diagnosis 1. Lymph node, sentinel, biopsy, right obturator - ONE BENIGN LYMPH NODE (0/1). 2. Lymph node, sentinel, biopsy, right peri-aortic - ONE BENIGN LYMPH NODE (0/1). 3. Lymph node, sentinel, biopsy, right lower peri-aortic - ONE BENIGN LYMPH NODE (0/1). 4. Lymph node, sentinel, biopsy, aortic bifurcation - ONE BENIGN LYMPH NODE (0/1). 5. Lymph node,  sentinel, biopsy, left obturator - ONE BENIGN LYMPH NODE (0/1). 6. Uterus +/- tubes/ovaries, neoplastic - ENDOMETRIAL ADENOCARCINOMA, 1.7 CM WITH SUPERFICIAL MYOMETRIAL INVASION. - MARGINS NOT INVOLVED. - CERVIX, BILATERAL OVARIES AND BILATERAL FALLOPIAN TUBES FREE  OF TUMOR. Microscopic Comment 6. ONCOLOGY TABLE-UTERUS, CARCINOMA OR CARCINOSARCOMA Specimen: Uterus with bilateral fallopian tubes and ovaries and sentinel lymph node biopsies. Procedure: Hysterectomy with sentinel lymph nodes. Lymph node sampling performed: Yes. Specimen integrity: Intact. Maximum tumor size: 1.7 cm Histologic type: Endometrioid with squamous differentiation. Grade: 2 Myometrial invasion: 0.3 cm where myometrium is 1 cm in thickness Cervical stromal involvement: No. Extent of involvement of other organs: None, identified. Lymph - vascular invasion: Not identified.  Interval History: 05/03/16 CT guided drainage: IMPRESSION: Successful CT guided aspiration of approximately 15 cc of serous, slightly cloudy / milky fluid from the residual collection within the left hemipelvis. All aspirated samples were sent to the laboratory for cytologic and gram stain analysis.  Cytology was negative.  Shannon Scott in February 2019 presented to her primary physician with complaints of rectal bleeding.  She was scheduled for a colonoscopy and had taken the bowel prep when she presented with obstruction of her distal sigmoid colon.  Imaging 08/18/2017 The left ureter is dilated to the left hemipelvis. It becomes narrowed in the inflammatory pelvic mass. This is described below. Bladder is within normal limits.  Stomach/Bowel: Large hiatal hernia is unchanged. Stable prominent gastric folds within the hiatal hernia.  The colon is diffusely distended. Distal small bowel is distended. A transition point between dilated large bowel and decompressed large bowel occurs in the sigmoid colon. There is a heterogeneous ill-defined mass involving the sigmoid colon and left side of the pelvis which includes the colon wall, adjacent fat, and both fluid and gas elements. Findings are suspicious for a focal perforation with abscess formation. Underlying malignancy is a strong consideration given  the appearance and colon obstruction. The mass also involves the left ureter causing an element of left ureteral dilatation worrisome for an element of left ureteral obstruction. The gas and fluid collection is very small measuring 1.8 x 1.2 cm on  image 109 of series 4. Overall mass size including the involved colon is 5.7 x 5.3 cm.  On August 21, 2017 she underwent a diverting laparoscopic transverse loop colostomy and subsequent placement of a left ureteral stent.  She then received pelvic external beam radiation therapy followed by 6 cycles of Taxol and carboplatin.  The last treatment was administered in August 2019. CT of the abdomen and pelvis on January 18, 2018 is notable for the presence of an IVC filter and 11 mm left internal iliac node that was previously 1.6 cm and a soft tissue mass in the left pelvic sidewall and now measuring 3.9 x 2.5 cm.  PET imaging February 28, 2018 demonstrates a hypermetabolic left pelvic sidewall mass, greater than 3 weeks after completion of radiotherapy. She is receiving single agent pembrolizumab lab cycle 6 administered 07/26/18   Review of Systems  Constitutional:  Denies fever. Skin: Fine macular nonblanching patches over the upper extremity  cardiovascular: No chest pain, shortness of breath, or edema  Pulmonary: No cough  Gastro Intestinal: Reporting stable parastomal hernia, with infrequent feeling of fullness in the area.  No nausea, vomiting, constipation, or diarrhea reported.  Reports passage of gas from the rectum, Genitourinary: Denies vaginal bleeding and discharge.  Musculoskeletal: Right hip pain, unbearable, Planned steroid injection. Reports swelling of the left lower extremity Neurologic: No weakness  Allergy:  Allergies  Allergen Reactions  . Statins Other (See Comments)    Myalgias and memory problems  . Ciprofloxacin Itching    Splotchy redness with itching during IV infusion localized to arm.    Social Hx:   Social  History   Socioeconomic History  . Marital status: Widowed    Spouse name: Not on file  . Number of children: 2  . Years of education: Not on file  . Highest education level: Not on file  Occupational History    Employer: RETIRED  Social Needs  . Financial resource strain: Not on file  . Food insecurity:    Worry: Not on file    Inability: Not on file  . Transportation needs:    Medical: Not on file    Non-medical: Not on file  Tobacco Use  . Smoking status: Never Smoker  . Smokeless tobacco: Never Used  Substance and Sexual Activity  . Alcohol use: No  . Drug use: No  . Sexual activity: Not on file  Lifestyle  . Physical activity:    Days per week: Not on file    Minutes per session: Not on file  . Stress: Not on file  Relationships  . Social connections:    Talks on phone: Not on file    Gets together: Not on file    Attends religious service: Not on file    Active member of club or organization: Not on file    Attends meetings of clubs or organizations: Not on file    Relationship status: Not on file  . Intimate partner violence:    Fear of current or ex partner: Not on file    Emotionally abused: Not on file    Physically abused: Not on file    Forced sexual activity: Not on file  Other Topics Concern  . Not on file  Social History Narrative  . Not on file    Past Surgical Hx:  Past Surgical History:  Procedure Laterality Date  . ABDOMINAL HYSTERECTOMY    . Carotid Doppler  02/2012   40-59% right int carotid artery stenosis; 60-79% L int carotid stenosis; L carotid bruit  . CORONARY ARTERY BYPASS GRAFT  03/12/2004   LIMA to LAD, SVG to circumflex, SVG to PDA  . CYSTOSCOPY W/ URETERAL STENT PLACEMENT Left 12/06/2017   Procedure: CYSTOSCOPY WITH LEFT URETERAL STENT EXCHANGE;  Surgeon: Irine Seal, MD;  Location: [facility];  Service: Urology;  Laterality: Left;  . CYSTOSCOPY WITH STENT PLACEMENT Bilateral 08/21/2017   Procedure: CYSTOSCOPY WITH STENT PLACEMENT;   Surgeon: Ceasar Mons, MD;  Location: [facility];  Service: Urology;  Laterality: Bilateral;  . IR FLUORO GUIDE PORT INSERTION RIGHT  10/11/2017  . IR GENERIC HISTORICAL  04/29/2016   IR RADIOLOGIST EVAL & MGMT 04/29/2016 Sandi Mariscal, MD [facility]  . IR GENERIC HISTORICAL  05/12/2016   IR RADIOLOGIST EVAL & MGMT 05/12/2016 Sandi Mariscal, MD [facility]  . IR IVC FILTER PLMT / S&I /IMG GUID/MOD SED  10/11/2017  . IR US GUIDE VASC ACCESS RIGHT  10/11/2017  . ROBOTIC ASSISTED TOTAL HYSTERECTOMY WITH BILATERAL SALPINGO OOPHERECTOMY Bilateral 12/30/2015   Procedure: XI ROBOTIC ASSISTED TOTAL HYSTERECTOMY WITH BILATERAL SALPINGO OOPHORECTOMY WITH SENTAL LYMPH NODE BIOPSY;  Surgeon: Nancy Marus, MD;  Location: [facility];  Service: Gynecology;  Laterality: Bilateral;  . TONSILLECTOMY    . TRANSTHORACIC ECHOCARDIOGRAM  04/2007   EF>55%; mild MR; mild-mod TR; mild pulm HTN; mild calcification of  aortiv valve leaflets with mild valvular aortic stenosis  . TUBAL LIGATION      Past Medical Hx:  Past Medical History:  Diagnosis Date  . Arthritis   . Asthma    allergy induced  . Bilateral carotid artery disease (HCC)    L carotid bruit  . Bursitis    left hip  . CAD (coronary artery disease)   . Cancer ([place])   . Dyslipidemia    intolerant to statins, welchol, niacin, zetia  . Goals of care, counseling/discussion 10/11/2017  . History of blood transfusion   . History of nuclear stress test 04/24/2012   lexiscan; normal study  . Hypertension   . Hypothyroidism   . 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 ([place]) 10/11/2017  . Postoperative nausea and vomiting 01/02/2016    Past Gynecological History:  See HPI  No LMP recorded. Patient has had a hysterectomy.  Family Hx:  Family History  Problem Relation Age of Onset  . Hypertension Mother   . Heart disease Mother        Died in her 80s  . Stroke Father   . Kidney disease Brother   . Heart  disease Brother        also HTN, hyperlipidemia  . Heart attack Brother   . Stroke Sister        x2  . Hypertension Sister     Vitals:  Blood pressure (!) 154/54, pulse 72, temperature 98.3 F (36.8 C), temperature source Oral, resp. rate 18, height 5' 1"  (1.549 m), weight 141 lb (64 kg), SpO2 98 %. BMI 28kg/m2  Physical Exam: WD in NAD CHEST:  CTA Abd.  Colostomy present with air and stool.  Parastomal hernia present.  Palpable masses or ascites Pelvic: Normal external genitalia Bartholin's urethra Skene's, No pelvic masses palpable.  Vagina is atrophic without any lesions rectal good tone no palpable masses. Erythema of the mons Lymph nodes no cervical supraclavicular adenopathy Extremities 3-2+ left lower extremity edema extending to the knee Back: No CVA tenderness Musculoskeletal:  Significant right hip pain.

## 2018-08-04 ENCOUNTER — Ambulatory Visit (HOSPITAL_COMMUNITY)
Admission: RE | Admit: 2018-08-04 | Discharge: 2018-08-04 | Disposition: A | Discharge status: Home / Self Care | Payer: Medicare Other | Source: Ambulatory Visit | Attending: Gynecologic Oncology | Admitting: Gynecologic Oncology

## 2018-08-04 ENCOUNTER — Telehealth: Payer: Self-pay

## 2018-08-04 DIAGNOSIS — C541 Malignant neoplasm of endometrium: Secondary | ICD-10-CM

## 2018-08-04 DIAGNOSIS — R600 Localized edema: Secondary | ICD-10-CM | POA: Insufficient documentation

## 2018-08-04 NOTE — Telephone Encounter (Signed)
Told Shannon Scott that the doppler report was being  Sent to her PCP Dr. Fara Olden and Dr. Jonette Eva for review.  There is no acute finding from the doppler report per [name], NP. Pt verbalized understanding.

## 2018-08-04 NOTE — Progress Notes (Signed)
Left lower extremity venous duplex exam completed.   Result called and discussed the incidental findings with ordering physician's office RN Melisa.  More details please see preliminary notes on CV PROC under chart review.   Merla Sawka H Pearla Mckinny(RDMS RVT) 08/04/18 11:41 AM

## 2018-08-15 ENCOUNTER — Telehealth: Payer: Self-pay | Admitting: Hematology & Oncology

## 2018-08-15 NOTE — Telephone Encounter (Signed)
Called and spoke with patient regarding appointments that have been moved out from 3/18 to 3/25 .  She is ok with date/time per 3/17 staff message

## 2018-08-16 ENCOUNTER — Inpatient Hospital Stay: Payer: Medicare Other

## 2018-08-16 ENCOUNTER — Other Ambulatory Visit: Payer: Self-pay | Admitting: Hematology & Oncology

## 2018-08-16 ENCOUNTER — Inpatient Hospital Stay: Payer: Medicare Other | Admitting: Family

## 2018-08-17 ENCOUNTER — Ambulatory Visit: Payer: Medicare Other | Admitting: Gynecologic Oncology

## 2018-08-21 ENCOUNTER — Telehealth: Payer: Self-pay | Admitting: *Deleted

## 2018-08-21 NOTE — Telephone Encounter (Signed)
Patient called and left a voice mail message stating,"I need to cancel my appointments this week because I'm having bad pains in my back and legs. I'm working with a pain doctor. I will call to reschedule the appointments."

## 2018-08-23 ENCOUNTER — Inpatient Hospital Stay: Payer: Medicare Other

## 2018-08-23 ENCOUNTER — Inpatient Hospital Stay: Payer: Medicare Other | Admitting: Family

## 2018-09-06 ENCOUNTER — Other Ambulatory Visit: Payer: Medicare Other

## 2018-09-06 ENCOUNTER — Ambulatory Visit: Payer: Medicare Other

## 2018-09-06 ENCOUNTER — Ambulatory Visit: Payer: Medicare Other | Admitting: Hematology & Oncology

## 2018-09-14 ENCOUNTER — Other Ambulatory Visit: Payer: Medicare Other

## 2018-09-14 ENCOUNTER — Ambulatory Visit: Payer: Medicare Other

## 2018-09-14 ENCOUNTER — Ambulatory Visit: Payer: Medicare Other | Admitting: Hematology & Oncology

## 2018-09-22 ENCOUNTER — Telehealth: Payer: Self-pay | Admitting: Hematology & Oncology

## 2018-09-22 NOTE — Telephone Encounter (Signed)
lmom to inform pt e-visit 4/28 at 1015 per 4/24 sch msg

## 2018-09-26 ENCOUNTER — Inpatient Hospital Stay: Payer: Medicare Other | Attending: Hematology & Oncology | Admitting: Hematology & Oncology

## 2018-09-26 ENCOUNTER — Telehealth: Payer: Self-pay | Admitting: Hematology & Oncology

## 2018-09-26 ENCOUNTER — Other Ambulatory Visit: Payer: Self-pay

## 2018-09-26 ENCOUNTER — Telehealth: Payer: Self-pay | Admitting: *Deleted

## 2018-09-26 ENCOUNTER — Other Ambulatory Visit: Payer: Self-pay | Admitting: *Deleted

## 2018-09-26 DIAGNOSIS — C539 Malignant neoplasm of cervix uteri, unspecified: Secondary | ICD-10-CM

## 2018-09-26 DIAGNOSIS — C541 Malignant neoplasm of endometrium: Secondary | ICD-10-CM | POA: Insufficient documentation

## 2018-09-26 DIAGNOSIS — C799 Secondary malignant neoplasm of unspecified site: Secondary | ICD-10-CM | POA: Diagnosis not present

## 2018-09-26 MED ORDER — FENTANYL 12 MCG/HR TD PT72: 1.0000 | MEDICATED_PATCH | TRANSDERMAL | 0 refills | Status: DC

## 2018-09-26 NOTE — Telephone Encounter (Signed)
sw pt to confirm 5/12 appt at 12 pm per 4/28 sch msg

## 2018-09-26 NOTE — Telephone Encounter (Signed)
Spoke with pt discussed MD will order a PET scan, Rx for Fentanyl called into pt pharmacy . Message to scheduling for pt to have lab/MD/infusion appt.

## 2018-09-26 NOTE — Telephone Encounter (Signed)
Pt called pt states she read side effects of the Rx for pain med and she declines to fill it. Per MD, pt to call ortho surgeon regarding treatments for pain. Pt states " I do have an appt on May 6 with my ortho surgeon and he is going to give me an injection for the pain." No further concerns.

## 2018-09-26 NOTE — Telephone Encounter (Signed)
Called pt, unable to connect for today virtual visit with MD, reviewed with pt her concerns:  1.Right Hip pain, taking tylenol and tramadol  2. " Where am I with the treatment, is the tumor shrinking?"  Asked pt to come into office today.Pt unable as she does not have transportation. Pt unable to come in this week as she is unsure of her family's schedule and bringing her into the clinic.  Will review pt concerns with MD and call pt with additional information.

## 2018-09-26 NOTE — Progress Notes (Signed)
Unfortunately, I cannot get a hold of [name] today with his virtual visit.  She clearly needs to have a PET scan.  She has not been treated for 2 months.  She has been hampered by her back.  The back is not from any malignancy as far as we can tell.  We done a very thorough work-up on her back.  She has been to orthopedic surgery.  She has not had immunotherapy now for 2 months.  I just worry that her tumor is worsening.  As far as her pain is concerned, we will try her on a fentanyl patch.  This will be every 3 days.  Hopefully this will help give her a little bit of relief.  She really needs to see orthopedic surgery for possible injection.  Hopefully, we will get the PET scan done in a week or so.  I really need to see her back in a couple weeks so we can do treatment with her.  Mary, our nurse, has been in constant contact with Shannon Scott today to inform her as to what we want to do and told her about the fentanyl patch and we will try to get her in in a couple weeks.  [name], MD

## 2018-09-28 ENCOUNTER — Telehealth: Payer: Self-pay | Admitting: Hematology & Oncology

## 2018-09-28 ENCOUNTER — Other Ambulatory Visit: Payer: Self-pay | Admitting: Family

## 2018-09-28 NOTE — Telephone Encounter (Signed)
Called and spoke with patient regarding appointment for PET date/time/location w/ instructions were given

## 2018-10-05 ENCOUNTER — Other Ambulatory Visit: Payer: Self-pay

## 2018-10-05 ENCOUNTER — Encounter (HOSPITAL_COMMUNITY)
Admission: RE | Admit: 2018-10-05 | Discharge: 2018-10-05 | Disposition: A | Discharge status: Home / Self Care | Payer: Medicare Other | Source: Ambulatory Visit | Attending: Hematology & Oncology | Admitting: Hematology & Oncology

## 2018-10-05 DIAGNOSIS — C539 Malignant neoplasm of cervix uteri, unspecified: Secondary | ICD-10-CM | POA: Diagnosis present

## 2018-10-05 DIAGNOSIS — C799 Secondary malignant neoplasm of unspecified site: Secondary | ICD-10-CM | POA: Insufficient documentation

## 2018-10-05 DIAGNOSIS — Z79899 Other long term (current) drug therapy: Secondary | ICD-10-CM | POA: Insufficient documentation

## 2018-10-05 LAB — GLUCOSE, CAPILLARY: Glucose-Capillary: 91 mg/dL (ref 70–99)

## 2018-10-05 MED ORDER — FLUDEOXYGLUCOSE F - 18 (FDG) INJECTION: 6.9000 | Freq: Once | INTRAVENOUS | Status: DC | PRN

## 2018-10-10 ENCOUNTER — Inpatient Hospital Stay: Payer: Medicare Other | Attending: Hematology & Oncology | Admitting: Hematology & Oncology

## 2018-10-10 ENCOUNTER — Inpatient Hospital Stay: Payer: Medicare Other

## 2018-10-10 ENCOUNTER — Other Ambulatory Visit: Payer: Self-pay

## 2018-10-10 ENCOUNTER — Other Ambulatory Visit: Payer: Self-pay | Admitting: Family

## 2018-10-10 VITALS — BP 162/59 | HR 72 | Temp 98.8°F | Resp 20 | Wt 136.8 lb

## 2018-10-10 DIAGNOSIS — M25559 Pain in unspecified hip: Secondary | ICD-10-CM | POA: Diagnosis not present

## 2018-10-10 DIAGNOSIS — Z5112 Encounter for antineoplastic immunotherapy: Secondary | ICD-10-CM | POA: Insufficient documentation

## 2018-10-10 DIAGNOSIS — C541 Malignant neoplasm of endometrium: Secondary | ICD-10-CM | POA: Diagnosis not present

## 2018-10-10 DIAGNOSIS — Z79899 Other long term (current) drug therapy: Secondary | ICD-10-CM | POA: Diagnosis not present

## 2018-10-10 DIAGNOSIS — Z7189 Other specified counseling: Secondary | ICD-10-CM

## 2018-10-10 DIAGNOSIS — C539 Malignant neoplasm of cervix uteri, unspecified: Secondary | ICD-10-CM

## 2018-10-10 DIAGNOSIS — Z933 Colostomy status: Secondary | ICD-10-CM | POA: Insufficient documentation

## 2018-10-10 DIAGNOSIS — D508 Other iron deficiency anemias: Secondary | ICD-10-CM

## 2018-10-10 DIAGNOSIS — E039 Hypothyroidism, unspecified: Secondary | ICD-10-CM

## 2018-10-10 LAB — CBC WITH DIFFERENTIAL (CANCER CENTER ONLY)
Abs Immature Granulocytes: 0.08 10*3/uL — ABNORMAL HIGH (ref 0.00–0.07)
Basophils Absolute: 0 10*3/uL (ref 0.0–0.1)
Basophils Relative: 0 %
Eosinophils Absolute: 0 10*3/uL (ref 0.0–0.5)
Eosinophils Relative: 1 %
HCT: 37.8 % (ref 36.0–46.0)
Hemoglobin: 12.8 g/dL (ref 12.0–15.0)
Immature Granulocytes: 1 %
Lymphocytes Relative: 7 %
Lymphs Abs: 0.6 10*3/uL — ABNORMAL LOW (ref 0.7–4.0)
MCH: 32.4 pg (ref 26.0–34.0)
MCHC: 33.9 g/dL (ref 30.0–36.0)
MCV: 95.7 fL (ref 80.0–100.0)
Monocytes Absolute: 0.7 10*3/uL (ref 0.1–1.0)
Monocytes Relative: 9 %
Neutro Abs: 7.1 10*3/uL (ref 1.7–7.7)
Neutrophils Relative %: 82 %
Platelet Count: 268 10*3/uL (ref 150–400)
RBC: 3.95 MIL/uL (ref 3.87–5.11)
RDW: 13.7 % (ref 11.5–15.5)
WBC Count: 8.5 10*3/uL (ref 4.0–10.5)
nRBC: 0 % (ref 0.0–0.2)

## 2018-10-10 LAB — CMP (CANCER CENTER ONLY)
ALT: 9 U/L (ref 0–44)
AST: 12 U/L — ABNORMAL LOW (ref 15–41)
Albumin: 4.3 g/dL (ref 3.5–5.0)
Alkaline Phosphatase: 82 U/L (ref 38–126)
Anion gap: 11 (ref 5–15)
BUN: 16 mg/dL (ref 8–23)
CO2: 27 mmol/L (ref 22–32)
Calcium: 10.3 mg/dL (ref 8.9–10.3)
Chloride: 98 mmol/L (ref 98–111)
Creatinine: 0.76 mg/dL (ref 0.44–1.00)
GFR, Est AFR Am: >60 mL/min (ref >60)
GFR, Estimated: >60 mL/min (ref >60)
Glucose, Bld: 97 mg/dL (ref 70–99)
Potassium: 3.4 mmol/L — ABNORMAL LOW (ref 3.5–5.1)
Sodium: 136 mmol/L (ref 135–145)
Total Bilirubin: 0.5 mg/dL (ref 0.3–1.2)
Total Protein: 7 g/dL (ref 6.5–8.1)

## 2018-10-10 MED ORDER — ZOLEDRONIC ACID 4 MG/5ML IV CONC
3.3000 mg | Freq: Once | INTRAVENOUS | Status: AC
  Administered 2018-10-10: 14:00:00 3.3 mg via INTRAVENOUS
  Filled 2018-10-10: qty 4.13

## 2018-10-10 MED ORDER — SODIUM CHLORIDE 0.9% FLUSH
10.0000 mL | INTRAVENOUS | Status: DC | PRN
  Administered 2018-10-10: 10 mL
  Filled 2018-10-10: qty 10

## 2018-10-10 MED ORDER — HEPARIN SOD (PORK) LOCK FLUSH 100 UNIT/ML IV SOLN
500.0000 [IU] | Freq: Once | INTRAVENOUS | Status: AC | PRN
  Administered 2018-10-10: 500 [IU]
  Filled 2018-10-10: qty 5

## 2018-10-10 MED ORDER — SODIUM CHLORIDE 0.9 % IV SOLN
Freq: Once | INTRAVENOUS | Status: AC
  Administered 2018-10-10: 13:00:00 via INTRAVENOUS
  Filled 2018-10-10: qty 250

## 2018-10-10 MED ORDER — SODIUM CHLORIDE 0.9 % IV SOLN
200.0000 mg | Freq: Once | INTRAVENOUS | Status: AC
  Administered 2018-10-10: 200 mg via INTRAVENOUS
  Filled 2018-10-10: qty 8

## 2018-10-10 NOTE — Progress Notes (Signed)
Hematology and Oncology Follow Up Visit  Shannon Scott [id number] 09/28/1940 78 y.o. 10/10/2018   Principle Diagnosis:  Locally advanced/recurrent endometrial carcinoma undifferentiated --  TMB (HIGH) / MSI HIGH  Current Therapy:   Radiation therapy for 5 -5 1/2 weeks - s/p 20 fractions Taxol/carboplatinumq 7 days -- s/p cycle #6 Pembrolizumab 200 mg IV q 3 wk -- s/p cycle #6 Zometa 4 mg IV q 3 months -- next dose in 12/2018 IV iron as indicated - last received 11/10/2017   Interim History: Shannon Scott is here today with her daughter for follow-up.  It has been 3 months since we saw her.  She has missed some appointments.  She still has a lot of back and hip pain.  She has fractures in her sacrum.  These seem to be insufficiency fractures.  We did go ahead and do a PET scan on her.  We were able to get this on 10/05/2018.  Not surprisingly, the pelvic sidewall mass is better.  It now measures 2.4 x 1.3 cm.  The SUV is only 3.3.  She has no mediastinal lymph nodes at this time.  There is minimal residual hypermetabolism in a left suprahilar lymph node that only measures with an SUV of 3.2.  She has resolved bilateral hilar nodal hypermetabolism.  She does have hypermetabolic sacral fractures.  She has some hypermetabolism right symphysis pubis fracture with some adjacent soft tissue swelling.  Is hard to say exactly what this indicates.  I would think that this would be traumatic in nature.  I think that she probably needs Zometa.  We will try to have her get Zometa today.  We will try Zometa every 3 months.  She has had no nausea or vomiting.  She is eating okay.  With the coronavirus, she is gone nowhere.  She has really  isolated herself at home.  I am actually impressed with how well she looks.  Currently, her performance status is ECOG 1.    Medications:  Allergies as of 10/10/2018      Reactions   Statins Other (See Comments)   Myalgias and memory problems   Ciprofloxacin Itching   Splotchy redness with itching during IV infusion localized to arm.      Medication List       Accurate as of Oct 10, 2018  1:51 PM. If you have any questions, ask your nurse or doctor.        amoxicillin 500 MG tablet Commonly known as:  AMOXIL Take four tablets prior to dental procedure   diclofenac sodium 1 % Gel Commonly known as:  VOLTAREN Apply 2 g topically 4 (four) times daily.   diltiazem 300 MG 24 hr capsule Commonly known as:  TIAZAC TAKE 1 CAPSULE(300 MG) BY MOUTH DAILY What changed:  See the new instructions.   fentaNYL 12 MCG/HR Commonly known as:  [place] 1 patch onto the skin every 3 (three) days.   gabapentin 100 MG capsule Commonly known as:  Neurontin Take 1 capsule (100 mg total) by mouth 4 (four) times daily.   ibuprofen 200 MG tablet Commonly known as:  ADVIL Take 200 mg by mouth daily as needed (back pain).   lactose free nutrition Liqd Take 237 mLs by mouth 3 (three) times daily with meals.   levothyroxine 50 MCG tablet Commonly known as:  SYNTHROID Take 1 tablet (50 mcg total) by mouth daily before breakfast.   lidocaine-prilocaine cream Commonly known as:  EMLA Apply 1 application topically  as needed.   lipase/protease/amylase 36000 UNITS Cpep capsule Commonly known as:  Creon Take 2 capsules (72,000 Units total) by mouth 3 (three) times daily before meals.   methylPREDNISolone 4 MG Tbpk tablet Commonly known as:  MEDROL DOSEPAK Take as per package instructions   ondansetron 8 MG tablet Commonly known as:  ZOFRAN TAKE 1 TABLET(8 MG) BY MOUTH EVERY 8 HOURS AS NEEDED FOR NAUSEA OR VOMITING   oxyCODONE-acetaminophen 5-325 MG tablet Commonly known as:  Percocet Take 1-2 tablets by mouth every 6 (six) hours as needed.   pantoprazole 40 MG tablet Commonly known as:  PROTONIX Take 1 tablet (40 mg total) by mouth daily. What changed:    when to take this  reasons to take this   polyethylene glycol  17 g packet Commonly known as:  MIRALAX / GLYCOLAX Take 17 g by mouth daily.   prochlorperazine 10 MG tablet Commonly known as:  COMPAZINE Take 1 tablet (10 mg total) by mouth every 6 (six) hours as needed for nausea or vomiting.   senna-docusate 8.6-50 MG tablet Commonly known as:  Senokot-S Take 1 tablet by mouth 2 (two) times daily.   traMADol 50 MG tablet Commonly known as:  ULTRAM Take 2 tablets (100 mg total) by mouth every 6 (six) hours as needed.       Allergies:  Allergies  Allergen Reactions  . Statins Other (See Comments)    Myalgias and memory problems  . Ciprofloxacin Itching    Splotchy redness with itching during IV infusion localized to arm.    Past Medical History, Surgical history, Social history, and Family History were reviewed and updated.  Review of Systems: Review of Systems  Constitutional: Positive for malaise/fatigue and weight loss.  HENT: Negative.   Eyes: Negative.   Respiratory: Positive for cough.   Cardiovascular: Negative.   Gastrointestinal: Positive for nausea.  Genitourinary: Positive for frequency.  Musculoskeletal: Positive for myalgias.  Skin: Negative.   Neurological: Negative.   Endo/Heme/Allergies: Negative.   Psychiatric/Behavioral: Negative.      Physical Exam:  weight is 136 lb 12 oz (62 kg). Her oral temperature is 98.8 F (37.1 C). Her blood pressure is 162/59 (abnormal) and her pulse is 72. Her respiration is 20 and oxygen saturation is 99%.   Wt Readings from Last 3 Encounters:  10/10/18 136 lb 12 oz (62 kg)  08/03/18 141 lb (64 kg)  07/27/18 141 lb 8 oz (64.2 kg)    Physical Exam Vitals signs reviewed.  HENT:     Head: Normocephalic and atraumatic.  Eyes:     Pupils: Pupils are equal, round, and reactive to light.  Neck:     Musculoskeletal: Normal range of motion.  Cardiovascular:     Rate and Rhythm: Normal rate and regular rhythm.     Comments: Regular rate and rhythm with a normal S1 and S2.  She  has a 2/6 systolic ejection murmur. Pulmonary:     Effort: Pulmonary effort is normal.     Breath sounds: Normal breath sounds.  Abdominal:     Comments: Abdomen is soft.  She has a colostomy in the center of her abdomen.  This is actually below the umbilicus.  There is a slight hernia at the colostomy site.  Bowel sounds are active.  There is no guarding or rebound tenderness.  There is no fluid wave.  There is no palpable liver or spleen tip.  Musculoskeletal: Normal range of motion.        General:  No tenderness or deformity.  Lymphadenopathy:     Cervical: No cervical adenopathy.  Skin:    General: Skin is warm and dry.     Findings: No erythema or rash.  Neurological:     Mental Status: She is alert and oriented to person, place, and time.  Psychiatric:        Behavior: Behavior normal.        Thought Content: Thought content normal.        Judgment: Judgment normal.      Lab Results  Component Value Date   WBC 8.5 10/10/2018   HGB 12.8 10/10/2018   HCT 37.8 10/10/2018   MCV 95.7 10/10/2018   PLT 268 10/10/2018   Lab Results  Component Value Date   FERRITIN 872 (H) 07/27/2018   IRON 53 07/27/2018   TIBC 214 (L) 07/27/2018   UIBC 160 07/27/2018   IRONPCTSAT 25 07/27/2018   Lab Results  Component Value Date   RETICCTPCT 0.5 (L) 10/20/2017   RBC 3.95 10/10/2018   No results found for: KPAFRELGTCHN, LAMBDASER, KAPLAMBRATIO No results found for: IGGSERUM, IGA, IGMSERUM No results found for: Odetta Pink, SPEI   Chemistry      Component Value Date/Time   NA 136 10/10/2018 1220   NA 136 (A) 09/08/2017   K 3.4 (L) 10/10/2018 1220   CL 98 10/10/2018 1220   CO2 27 10/10/2018 1220   BUN 16 10/10/2018 1220   BUN 8 09/08/2017   CREATININE 0.76 10/10/2018 1220   CREATININE 0.69 08/02/2016 1519   GLU 111 09/08/2017      Component Value Date/Time   CALCIUM 10.3 10/10/2018 1220   ALKPHOS 82 10/10/2018 1220   AST  12 (L) 10/10/2018 1220   ALT 9 10/10/2018 1220   BILITOT 0.5 10/10/2018 1220      Impression and Plan: Shannon Scott is a very pleasant 78 yo caucasian female with recurrent undifferentiated endometrial carcinoma.   Again, she is responding to immunotherapy.  The fact that she has had no treatment in 3 months and she has improved hypermetabolic PET scan I think is a good indicator for [place].  I just hope that these sacral issues are going to be improved.  She really has a problem with these and will try to get around.  I spent a good 30 minutes or so with her.  It is been a while since we saw her.  I just hope that she will improve that the Zometa might make her feel a little bit better.  We will get her back here in 3 weeks.  We will have to continue the [facility].   Volanda Napoleon, MD

## 2018-10-10 NOTE — Progress Notes (Signed)
Okay to give Zometa today per Otilio Carpen, [employer].  Patient's insurance does not require PA.

## 2018-10-10 NOTE — Patient Instructions (Signed)
Pembrolizumab injection  What is this medicine?  PEMBROLIZUMAB (pem broe liz ue mab) is a monoclonal antibody. It is used to treat cervical cancer, esophageal cancer, head and neck cancer, hepatocellular cancer, Hodgkin lymphoma, kidney cancer, lymphoma, melanoma, Merkel cell carcinoma, lung cancer, stomach cancer, urothelial cancer, and cancers that have a certain genetic condition.  This medicine may be used for other purposes; ask your health care provider or pharmacist if you have questions.  COMMON BRAND NAME(S): Keytruda  What should I tell my health care provider before I take this medicine?  They need to know if you have any of these conditions:  -diabetes  -immune system problems  -inflammatory bowel disease  -liver disease  -lung or breathing disease  -lupus  -received or scheduled to receive an organ transplant or a stem-cell transplant that uses donor stem cells  -an unusual or allergic reaction to pembrolizumab, other medicines, foods, dyes, or preservatives  -pregnant or trying to get pregnant  -breast-feeding  How should I use this medicine?  This medicine is for infusion into a vein. It is given by a health care professional in a hospital or clinic setting.  A special MedGuide will be given to you before each treatment. Be sure to read this information carefully each time.  Talk to your pediatrician regarding the use of this medicine in children. While this drug may be prescribed for selected conditions, precautions do apply.  Overdosage: If you think you have taken too much of this medicine contact a poison control center or emergency room at once.  NOTE: This medicine is only for you. Do not share this medicine with others.  What if I miss a dose?  It is important not to miss your dose. Call your doctor or health care professional if you are unable to keep an appointment.  What may interact with this medicine?  Interactions have not been studied.  Give your health care provider a list of all the  medicines, herbs, non-prescription drugs, or dietary supplements you use. Also tell them if you smoke, drink alcohol, or use illegal drugs. Some items may interact with your medicine.  This list may not describe all possible interactions. Give your health care provider a list of all the medicines, herbs, non-prescription drugs, or dietary supplements you use. Also tell them if you smoke, drink alcohol, or use illegal drugs. Some items may interact with your medicine.  What should I watch for while using this medicine?  Your condition will be monitored carefully while you are receiving this medicine.  You may need blood work done while you are taking this medicine.  Do not become pregnant while taking this medicine or for 4 months after stopping it. Women should inform their doctor if they wish to become pregnant or think they might be pregnant. There is a potential for serious side effects to an unborn child. Talk to your health care professional or pharmacist for more information. Do not breast-feed an infant while taking this medicine or for 4 months after the last dose.  What side effects may I notice from receiving this medicine?  Side effects that you should report to your doctor or health care professional as soon as possible:  -allergic reactions like skin rash, itching or hives, swelling of the face, lips, or tongue  -bloody or black, tarry  -breathing problems  -changes in vision  -chest pain  -chills  -confusion  -constipation  -cough  -diarrhea  -dizziness or feeling faint or lightheaded  -  fast or irregular heartbeat  -fever  -flushing  -hair loss  -joint pain  -low blood counts - this medicine may decrease the number of white blood cells, red blood cells and platelets. You may be at increased risk for infections and bleeding.  -muscle pain  -muscle weakness  -persistent headache  -redness, blistering, peeling or loosening of the skin, including inside the mouth  -signs and symptoms of high blood sugar  such as dizziness; dry mouth; dry skin; fruity breath; nausea; stomach pain; increased hunger or thirst; increased urination  -signs and symptoms of kidney injury like trouble passing urine or change in the amount of urine  -signs and symptoms of liver injury like dark urine, light-colored stools, loss of appetite, nausea, right upper belly pain, yellowing of the eyes or skin  -sweating  -swollen lymph nodes  -weight loss  Side effects that usually do not require medical attention (report to your doctor or health care professional if they continue or are bothersome):  -decreased appetite  -muscle pain  -tiredness  This list may not describe all possible side effects. Call your doctor for medical advice about side effects. You may report side effects to FDA at 1-800-FDA-1088.  Where should I keep my medicine?  This drug is given in a hospital or clinic and will not be stored at home.  NOTE: This sheet is a summary. It may not cover all possible information. If you have questions about this medicine, talk to your doctor, pharmacist, or health care provider.   2019 [employer] (2017-12-29 15:06:10)

## 2018-10-10 NOTE — Progress Notes (Unsigned)
Zometa dose adjusted per protocol to 3.3 mg for CrCl = 47.

## 2018-10-10 NOTE — Patient Instructions (Signed)
Implanted Port Insertion, Care After  This sheet gives you information about how to care for yourself after your procedure. Your health care provider may also give you more specific instructions. If you have problems or questions, contact your health care provider.  What can I expect after the procedure?  After the procedure, it is common to have:  · Discomfort at the port insertion site.  · Bruising on the skin over the port. This should improve over 3-4 days.  Follow these instructions at home:  Port care  · After your port is placed, you will get a manufacturer's information card. The card has information about your port. Keep this card with you at all times.  · Take care of the port as told by your health care provider. Ask your health care provider if you or a family member can get training for taking care of the port at home. A home health care nurse may also take care of the port.  · Make sure to remember what type of port you have.  Incision care         · Follow instructions from your health care provider about how to take care of your port insertion site. Make sure you:  ? Wash your hands with soap and water before and after you change your bandage (dressing). If soap and water are not available, use hand sanitizer.  ? Change your dressing as told by your health care provider.  ? Leave stitches (sutures), skin glue, or adhesive strips in place. These skin closures may need to stay in place for 2 weeks or longer. If adhesive strip edges start to loosen and curl up, you may trim the loose edges. Do not remove adhesive strips completely unless your health care provider tells you to do that.  · Check your port insertion site every day for signs of infection. Check for:  ? Redness, swelling, or pain.  ? Fluid or blood.  ? Warmth.  ? Pus or a bad smell.  Activity  · Return to your normal activities as told by your health care provider. Ask your health care provider what activities are safe for you.  · Do not  lift anything that is heavier than 10 lb (4.5 kg), or the limit that you are told, until your health care provider says that it is safe.  General instructions  · Take over-the-counter and prescription medicines only as told by your health care provider.  · Do not take baths, swim, or use a hot tub until your health care provider approves. Ask your health care provider if you may take showers. You may only be allowed to take sponge baths.  · Do not drive for 24 hours if you were given a sedative during your procedure.  · Wear a medical alert bracelet in case of an emergency. This will tell any health care providers that you have a port.  · Keep all follow-up visits as told by your health care provider. This is important.  Contact a health care provider if:  · You cannot flush your port with saline as directed, or you cannot draw blood from the port.  · You have a fever or chills.  · You have redness, swelling, or pain around your port insertion site.  · You have fluid or blood coming from your port insertion site.  · Your port insertion site feels warm to the touch.  · You have pus or a bad smell coming from the port   insertion site.  Get help right away if:  · You have chest pain or shortness of breath.  · You have bleeding from your port that you cannot control.  Summary  · Take care of the port as told by your health care provider. Keep the manufacturer's information card with you at all times.  · Change your dressing as told by your health care provider.  · Contact a health care provider if you have a fever or chills or if you have redness, swelling, or pain around your port insertion site.  · Keep all follow-up visits as told by your health care provider.  This information is not intended to replace advice given to you by your health care provider. Make sure you discuss any questions you have with your health care provider.  Document Released: 03/07/2013 Document Revised: 12/13/2017 Document Reviewed:  12/13/2017  Elsevier Interactive Patient Education © 2019 [employer]

## 2018-10-11 LAB — IRON AND TIBC
Iron: 56 ug/dL (ref 41–142)
Saturation Ratios: 23 % (ref 21–57)
TIBC: 243 ug/dL (ref 236–444)
UIBC: 187 ug/dL (ref 120–384)

## 2018-10-11 LAB — FERRITIN: Ferritin: 628 ng/mL — ABNORMAL HIGH (ref 11–307)

## 2018-10-11 LAB — CA 125: Cancer Antigen (CA) 125: 10.9 U/mL (ref 0.0–38.1)

## 2018-10-24 ENCOUNTER — Telehealth: Payer: Self-pay | Admitting: *Deleted

## 2018-10-24 ENCOUNTER — Encounter: Payer: Self-pay | Admitting: Hematology & Oncology

## 2018-10-24 ENCOUNTER — Other Ambulatory Visit: Payer: Self-pay | Admitting: *Deleted

## 2018-10-24 MED ORDER — METHYLPREDNISOLONE 4 MG PO TBPK: ORAL_TABLET | ORAL | 0 refills | Status: DC

## 2018-10-24 NOTE — Telephone Encounter (Signed)
Notified pt MD sent rx for Medrol Dose pak to pt pharmacy for rash. Pt verbalized understanding.

## 2018-10-24 NOTE — Telephone Encounter (Signed)
Call received from patient stating that she has a rash on her arms and legs that has been there for approx two weeks and does not itch.  Notified patient that I would speak with Dr. Marin Olp and call her back with orders.

## 2018-10-31 ENCOUNTER — Inpatient Hospital Stay: Payer: Medicare Other | Attending: Hematology & Oncology

## 2018-10-31 ENCOUNTER — Other Ambulatory Visit: Payer: Self-pay

## 2018-10-31 ENCOUNTER — Encounter: Payer: Self-pay | Admitting: Family

## 2018-10-31 ENCOUNTER — Inpatient Hospital Stay: Payer: Medicare Other

## 2018-10-31 ENCOUNTER — Inpatient Hospital Stay (HOSPITAL_BASED_OUTPATIENT_CLINIC_OR_DEPARTMENT_OTHER): Payer: Medicare Other | Admitting: Family

## 2018-10-31 VITALS — BP 155/60 | HR 57 | Temp 98.8°F | Wt 134.0 lb

## 2018-10-31 DIAGNOSIS — M25551 Pain in right hip: Secondary | ICD-10-CM | POA: Insufficient documentation

## 2018-10-31 DIAGNOSIS — R21 Rash and other nonspecific skin eruption: Secondary | ICD-10-CM | POA: Diagnosis not present

## 2018-10-31 DIAGNOSIS — D508 Other iron deficiency anemias: Secondary | ICD-10-CM

## 2018-10-31 DIAGNOSIS — Z5112 Encounter for antineoplastic immunotherapy: Secondary | ICD-10-CM

## 2018-10-31 DIAGNOSIS — Z79899 Other long term (current) drug therapy: Secondary | ICD-10-CM

## 2018-10-31 DIAGNOSIS — C541 Malignant neoplasm of endometrium: Secondary | ICD-10-CM

## 2018-10-31 DIAGNOSIS — Z933 Colostomy status: Secondary | ICD-10-CM | POA: Insufficient documentation

## 2018-10-31 DIAGNOSIS — R232 Flushing: Secondary | ICD-10-CM | POA: Diagnosis not present

## 2018-10-31 DIAGNOSIS — R61 Generalized hyperhidrosis: Secondary | ICD-10-CM | POA: Diagnosis not present

## 2018-10-31 DIAGNOSIS — R600 Localized edema: Secondary | ICD-10-CM | POA: Insufficient documentation

## 2018-10-31 DIAGNOSIS — Z7189 Other specified counseling: Secondary | ICD-10-CM

## 2018-10-31 DIAGNOSIS — Z90722 Acquired absence of ovaries, bilateral: Secondary | ICD-10-CM | POA: Diagnosis not present

## 2018-10-31 DIAGNOSIS — G8929 Other chronic pain: Secondary | ICD-10-CM | POA: Insufficient documentation

## 2018-10-31 DIAGNOSIS — M25552 Pain in left hip: Secondary | ICD-10-CM | POA: Diagnosis not present

## 2018-10-31 DIAGNOSIS — S3210XA Unspecified fracture of sacrum, initial encounter for closed fracture: Secondary | ICD-10-CM | POA: Insufficient documentation

## 2018-10-31 DIAGNOSIS — Z923 Personal history of irradiation: Secondary | ICD-10-CM | POA: Insufficient documentation

## 2018-10-31 DIAGNOSIS — E039 Hypothyroidism, unspecified: Secondary | ICD-10-CM

## 2018-10-31 DIAGNOSIS — C539 Malignant neoplasm of cervix uteri, unspecified: Secondary | ICD-10-CM

## 2018-10-31 DIAGNOSIS — Z90710 Acquired absence of both cervix and uterus: Secondary | ICD-10-CM | POA: Diagnosis not present

## 2018-10-31 DIAGNOSIS — M549 Dorsalgia, unspecified: Secondary | ICD-10-CM | POA: Insufficient documentation

## 2018-10-31 DIAGNOSIS — C799 Secondary malignant neoplasm of unspecified site: Secondary | ICD-10-CM

## 2018-10-31 LAB — CBC WITH DIFFERENTIAL (CANCER CENTER ONLY)
Abs Immature Granulocytes: 0.09 10*3/uL — ABNORMAL HIGH (ref 0.00–0.07)
Basophils Absolute: 0 10*3/uL (ref 0.0–0.1)
Basophils Relative: 0 %
Eosinophils Absolute: 0 10*3/uL (ref 0.0–0.5)
Eosinophils Relative: 1 %
HCT: 39.6 % (ref 36.0–46.0)
Hemoglobin: 13.2 g/dL (ref 12.0–15.0)
Immature Granulocytes: 1 %
Lymphocytes Relative: 10 %
Lymphs Abs: 0.6 10*3/uL — ABNORMAL LOW (ref 0.7–4.0)
MCH: 32.2 pg (ref 26.0–34.0)
MCHC: 33.3 g/dL (ref 30.0–36.0)
MCV: 96.6 fL (ref 80.0–100.0)
Monocytes Absolute: 0.7 10*3/uL (ref 0.1–1.0)
Monocytes Relative: 10 %
Neutro Abs: 5.1 10*3/uL (ref 1.7–7.7)
Neutrophils Relative %: 78 %
Platelet Count: 281 10*3/uL (ref 150–400)
RBC: 4.1 MIL/uL (ref 3.87–5.11)
RDW: 13.1 % (ref 11.5–15.5)
WBC Count: 6.5 10*3/uL (ref 4.0–10.5)
nRBC: 0 % (ref 0.0–0.2)

## 2018-10-31 LAB — CMP (CANCER CENTER ONLY)
ALT: 9 U/L (ref 0–44)
AST: 10 U/L — ABNORMAL LOW (ref 15–41)
Albumin: 4.1 g/dL (ref 3.5–5.0)
Alkaline Phosphatase: 58 U/L (ref 38–126)
Anion gap: 8 (ref 5–15)
BUN: 20 mg/dL (ref 8–23)
CO2: 29 mmol/L (ref 22–32)
Calcium: 9 mg/dL (ref 8.9–10.3)
Chloride: 100 mmol/L (ref 98–111)
Creatinine: 0.8 mg/dL (ref 0.44–1.00)
GFR, Est AFR Am: >60 mL/min (ref >60)
GFR, Estimated: >60 mL/min (ref >60)
Glucose, Bld: 91 mg/dL (ref 70–99)
Potassium: 3.3 mmol/L — ABNORMAL LOW (ref 3.5–5.1)
Sodium: 137 mmol/L (ref 135–145)
Total Bilirubin: 0.5 mg/dL (ref 0.3–1.2)
Total Protein: 6.7 g/dL (ref 6.5–8.1)

## 2018-10-31 LAB — LACTATE DEHYDROGENASE: LDH: 157 U/L (ref 98–192)

## 2018-10-31 MED ORDER — SODIUM CHLORIDE 0.9 % IV SOLN
Freq: Once | INTRAVENOUS | Status: AC
  Administered 2018-10-31: 13:00:00 via INTRAVENOUS
  Filled 2018-10-31: qty 250

## 2018-10-31 MED ORDER — SODIUM CHLORIDE 0.9 % IV SOLN
200.0000 mg | Freq: Once | INTRAVENOUS | Status: AC
  Administered 2018-10-31: 200 mg via INTRAVENOUS
  Filled 2018-10-31: qty 8

## 2018-10-31 MED ORDER — HEPARIN SOD (PORK) LOCK FLUSH 100 UNIT/ML IV SOLN
500.0000 [IU] | Freq: Once | INTRAVENOUS | Status: AC | PRN
  Administered 2018-10-31: 500 [IU]
  Filled 2018-10-31: qty 5

## 2018-10-31 MED ORDER — SODIUM CHLORIDE 0.9% FLUSH
10.0000 mL | INTRAVENOUS | Status: DC | PRN
  Administered 2018-10-31: 10 mL
  Filled 2018-10-31: qty 10

## 2018-10-31 NOTE — Patient Instructions (Signed)
Pembrolizumab injection  What is this medicine?  PEMBROLIZUMAB (pem broe liz ue mab) is a monoclonal antibody. It is used to treat cervical cancer, esophageal cancer, head and neck cancer, hepatocellular cancer, Hodgkin lymphoma, kidney cancer, lymphoma, melanoma, Merkel cell carcinoma, lung cancer, stomach cancer, urothelial cancer, and cancers that have a certain genetic condition.  This medicine may be used for other purposes; ask your health care provider or pharmacist if you have questions.  COMMON BRAND NAME(S): Keytruda  What should I tell my health care provider before I take this medicine?  They need to know if you have any of these conditions:  -diabetes  -immune system problems  -inflammatory bowel disease  -liver disease  -lung or breathing disease  -lupus  -received or scheduled to receive an organ transplant or a stem-cell transplant that uses donor stem cells  -an unusual or allergic reaction to pembrolizumab, other medicines, foods, dyes, or preservatives  -pregnant or trying to get pregnant  -breast-feeding  How should I use this medicine?  This medicine is for infusion into a vein. It is given by a health care professional in a hospital or clinic setting.  A special MedGuide will be given to you before each treatment. Be sure to read this information carefully each time.  Talk to your pediatrician regarding the use of this medicine in children. While this drug may be prescribed for selected conditions, precautions do apply.  Overdosage: If you think you have taken too much of this medicine contact a poison control center or emergency room at once.  NOTE: This medicine is only for you. Do not share this medicine with others.  What if I miss a dose?  It is important not to miss your dose. Call your doctor or health care professional if you are unable to keep an appointment.  What may interact with this medicine?  Interactions have not been studied.  Give your health care provider a list of all the  medicines, herbs, non-prescription drugs, or dietary supplements you use. Also tell them if you smoke, drink alcohol, or use illegal drugs. Some items may interact with your medicine.  This list may not describe all possible interactions. Give your health care provider a list of all the medicines, herbs, non-prescription drugs, or dietary supplements you use. Also tell them if you smoke, drink alcohol, or use illegal drugs. Some items may interact with your medicine.  What should I watch for while using this medicine?  Your condition will be monitored carefully while you are receiving this medicine.  You may need blood work done while you are taking this medicine.  Do not become pregnant while taking this medicine or for 4 months after stopping it. Women should inform their doctor if they wish to become pregnant or think they might be pregnant. There is a potential for serious side effects to an unborn child. Talk to your health care professional or pharmacist for more information. Do not breast-feed an infant while taking this medicine or for 4 months after the last dose.  What side effects may I notice from receiving this medicine?  Side effects that you should report to your doctor or health care professional as soon as possible:  -allergic reactions like skin rash, itching or hives, swelling of the face, lips, or tongue  -bloody or black, tarry  -breathing problems  -changes in vision  -chest pain  -chills  -confusion  -constipation  -cough  -diarrhea  -dizziness or feeling faint or lightheaded  -  fast or irregular heartbeat  -fever  -flushing  -hair loss  -joint pain  -low blood counts - this medicine may decrease the number of white blood cells, red blood cells and platelets. You may be at increased risk for infections and bleeding.  -muscle pain  -muscle weakness  -persistent headache  -redness, blistering, peeling or loosening of the skin, including inside the mouth  -signs and symptoms of high blood sugar  such as dizziness; dry mouth; dry skin; fruity breath; nausea; stomach pain; increased hunger or thirst; increased urination  -signs and symptoms of kidney injury like trouble passing urine or change in the amount of urine  -signs and symptoms of liver injury like dark urine, light-colored stools, loss of appetite, nausea, right upper belly pain, yellowing of the eyes or skin  -sweating  -swollen lymph nodes  -weight loss  Side effects that usually do not require medical attention (report to your doctor or health care professional if they continue or are bothersome):  -decreased appetite  -muscle pain  -tiredness  This list may not describe all possible side effects. Call your doctor for medical advice about side effects. You may report side effects to FDA at 1-800-FDA-1088.  Where should I keep my medicine?  This drug is given in a hospital or clinic and will not be stored at home.  NOTE: This sheet is a summary. It may not cover all possible information. If you have questions about this medicine, talk to your doctor, pharmacist, or health care provider.   2019 [employer] (2017-12-29 15:06:10)

## 2018-10-31 NOTE — Progress Notes (Signed)
Hematology and Oncology Follow Up Visit  Shannon Scott [id number] 1940/09/18 78 y.o. 10/31/2018   Principle Diagnosis:  Locally advanced/recurrent endometrial carcinoma undifferentiated --  TMB (HIGH) / MSI HIGH  Past Therapy: Radiation therapyfor 5 -5 1/2 weeks - s/p 20 fractions Taxol/carboplatinumq 7 days -- s/p cycle 6   Current Therapy:   Pembrolizumab 200 mg IV q 3 wk -- s/p cycle 7 Zometa 4 mg IV q 3 months -- next dose in 12/2018 IV iron as indicated - last received 11/10/2017   Interim History:  Shannon Scott is here today for follow-up and treatment. She is doing well at this time.  She finished her medrol dose pack yesterday for the rash on her arms and legs which has almost completely resolved.  She states that she has intermittent pain in her hips and takes her pain medication as needed which helps.  No fever, chills, n/v, cough, rash, dizziness, SOB, chest pain, palpitations, abdominal pain or changes in bowel or bladder habits.  Her ostomy is functioning appropriately. She takes Miralax daily to help stay regular. She has what appears to be a hernia above her stoma which she states her surgeon is aware of.  No episodes of bleeding, no bruising or petechiae.  No swelling or tenderness in her extremities at this time. She will occasionally have tingling in her feet depending on her position in a chair.  No lymphadenopathy noted on exam.  She is eating well and staying hydrated. Her weight is stable.   ECOG Performance Status: 1 - Symptomatic but completely ambulatory  Medications:  Allergies as of 10/31/2018      Reactions   Statins Other (See Comments)   Myalgias and memory problems   Ciprofloxacin Itching   Splotchy redness with itching during IV infusion localized to arm.      Medication List       Accurate as of October 31, 2018  1:16 PM. If you have any questions, ask your nurse or doctor.        amoxicillin 500 MG tablet Commonly known as:  AMOXIL  Take four tablets prior to dental procedure   diclofenac sodium 1 % Gel Commonly known as:  VOLTAREN Apply 2 g topically 4 (four) times daily.   diltiazem 300 MG 24 hr capsule Commonly known as:  TIAZAC TAKE 1 CAPSULE(300 MG) BY MOUTH DAILY What changed:  See the new instructions.   fentaNYL 12 MCG/HR Commonly known as:  [place] 1 patch onto the skin every 3 (three) days.   gabapentin 100 MG capsule Commonly known as:  Neurontin Take 1 capsule (100 mg total) by mouth 4 (four) times daily.   ibuprofen 200 MG tablet Commonly known as:  ADVIL Take 200 mg by mouth daily as needed (back pain).   lactose free nutrition Liqd Take 237 mLs by mouth 3 (three) times daily with meals.   levothyroxine 50 MCG tablet Commonly known as:  SYNTHROID Take 1 tablet (50 mcg total) by mouth daily before breakfast.   lidocaine-prilocaine cream Commonly known as:  EMLA Apply 1 application topically as needed.   lipase/protease/amylase 36000 UNITS Cpep capsule Commonly known as:  Creon Take 2 capsules (72,000 Units total) by mouth 3 (three) times daily before meals.   methylPREDNISolone 4 MG Tbpk tablet Commonly known as:  MEDROL DOSEPAK Take as per package instructions   ondansetron 8 MG tablet Commonly known as:  ZOFRAN TAKE 1 TABLET(8 MG) BY MOUTH EVERY 8 HOURS AS NEEDED FOR NAUSEA OR  VOMITING   oxyCODONE-acetaminophen 5-325 MG tablet Commonly known as:  Percocet Take 1-2 tablets by mouth every 6 (six) hours as needed.   pantoprazole 40 MG tablet Commonly known as:  PROTONIX Take 1 tablet (40 mg total) by mouth daily. What changed:    when to take this  reasons to take this   polyethylene glycol 17 g packet Commonly known as:  MIRALAX / GLYCOLAX Take 17 g by mouth daily.   prochlorperazine 10 MG tablet Commonly known as:  COMPAZINE Take 1 tablet (10 mg total) by mouth every 6 (six) hours as needed for nausea or vomiting.   senna-docusate 8.6-50 MG tablet  Commonly known as:  Senokot-S Take 1 tablet by mouth 2 (two) times daily.   traMADol 50 MG tablet Commonly known as:  ULTRAM Take 2 tablets (100 mg total) by mouth every 6 (six) hours as needed.       Allergies:  Allergies  Allergen Reactions  . Statins Other (See Comments)    Myalgias and memory problems  . Ciprofloxacin Itching    Splotchy redness with itching during IV infusion localized to arm.    Past Medical History, Surgical history, Social history, and Family History were reviewed and updated.  Review of Systems: All other 10 point review of systems is negative.   Physical Exam:  weight is 134 lb (60.8 kg). Her oral temperature is 98.8 F (37.1 C). Her blood pressure is 155/60 (abnormal) and her pulse is 57 (abnormal). Her oxygen saturation is 99%.   Wt Readings from Last 3 Encounters:  10/31/18 134 lb (60.8 kg)  10/10/18 136 lb 12 oz (62 kg)  08/03/18 141 lb (64 kg)    Ocular: Sclerae unicteric, pupils equal, round and reactive to light Ear-nose-throat: Oropharynx clear, dentition fair Lymphatic: No cervical or supraclavicular adenopathy Lungs no rales or rhonchi, good excursion bilaterally Heart regular rate and rhythm, no murmur appreciated Abd soft, nontender, positive bowel sounds, no liver or spleen tip palpated on exam, hernia present above stoma MSK no focal spinal tenderness, no joint edema Neuro: non-focal, well-oriented, appropriate affect Breasts: Deferred   Lab Results  Component Value Date   WBC 6.5 10/31/2018   HGB 13.2 10/31/2018   HCT 39.6 10/31/2018   MCV 96.6 10/31/2018   PLT 281 10/31/2018   Lab Results  Component Value Date   FERRITIN 628 (H) 10/10/2018   IRON 56 10/10/2018   TIBC 243 10/10/2018   UIBC 187 10/10/2018   IRONPCTSAT 23 10/10/2018   Lab Results  Component Value Date   RETICCTPCT 0.5 (L) 10/20/2017   RBC 4.10 10/31/2018   No results found for: KPAFRELGTCHN, LAMBDASER, KAPLAMBRATIO No results found for:  IGGSERUM, IGA, IGMSERUM No results found for: Odetta Pink, SPEI   Chemistry      Component Value Date/Time   NA 137 10/31/2018 1140   NA 136 (A) 09/08/2017   K 3.3 (L) 10/31/2018 1140   CL 100 10/31/2018 1140   CO2 29 10/31/2018 1140   BUN 20 10/31/2018 1140   BUN 8 09/08/2017   CREATININE 0.80 10/31/2018 1140   CREATININE 0.69 08/02/2016 1519   GLU 111 09/08/2017      Component Value Date/Time   CALCIUM 9.0 10/31/2018 1140   ALKPHOS 58 10/31/2018 1140   AST 10 (L) 10/31/2018 1140   ALT 9 10/31/2018 1140   BILITOT 0.5 10/31/2018 1140       Impression and Plan: Ms. Mcgruder is a very  pleasant 78 yo caucasian female with recurrent undifferentiated endometrial carcinoma.  She continues to respond well and we will proceed with treatment today as planned.  We will plan to see her back in another 3 weeks.  She will contact our office with any questions or concerns. We can certainly see her sooner if need be.   Laverna Peace, NP 6/2/20201:16 PM

## 2018-11-01 LAB — IRON AND TIBC
Iron: 71 ug/dL (ref 41–142)
Saturation Ratios: 33 % (ref 21–57)
TIBC: 216 ug/dL — ABNORMAL LOW (ref 236–444)
UIBC: 145 ug/dL (ref 120–384)

## 2018-11-01 LAB — TSH: TSH: 0.931 u[IU]/mL (ref 0.308–3.960)

## 2018-11-01 LAB — FERRITIN: Ferritin: 544 ng/mL — ABNORMAL HIGH (ref 11–307)

## 2018-11-07 NOTE — Progress Notes (Signed)
GYN ONCOLOGY OFFICE VISIT    Shannon Scott 78 y.o. female  CC: Recurrent endometrial cancer, rash, fatigue   Assessment/Plan:  [name]  is a 78 y.o.  year old with Stage IA grade 2 endometrioid adenocarcinoma.  Status post surgical staging  12/30/2015. She only had 30% depth of myometrial invasion, no lymphovascular space involvement, and low risk factors for recurrence therefore adjuvant therapy not recommended in accordance to NCCN guidelines.   In March 2019 she presented with a bowel perforation and distal rectosigmoid obstruction. She underwent a transverse diverting colostomy and subsequent placement of ureteral stent.  She completed external beam radiotherapy in addition to Taxol and carboplatin for 6 cycles.    Recurrence to the left pelvic sidewall was identified 12/2017.  Molecular assessment of the tumor indicates that this is an MSI high tumor that is ER positive.  She has completed 7 cycles of immunotherapy pembrolizumab. Imaging 09/2018 notable for response in the size of the left pelvic sidewall mass with no new areas of uptake  Regarding duration of pembrolizumab, there has been a great response to this agent however imaging still suggested there is residual disease.  I shared with Shannon Scott and her son that the duration of treatment is usually for about 1 to 2 years however Dr.Ennever will make the decision duration of treatment based on tolerability of the drug.  We also discussed that lenvatinib may also be added to the regimen.    Follow-up with Dr. Marin Olp for assessment and symptomatic treatment of rash and fatigue. Discussed with Shannon Scott  Low back pain CT 09/2018 with sacral insufficiency fractures which may be the etiology of the discomfort  Left lower extremity edema Shannon Scott has noted persistent intermittent edema of the LLE. Has been referred to lyphedema clinic.  Wll refer again. -She has support hose but states that it is too  tight to wear.    Follow-up with Gyn Oncology in 4 months.    HPI: Oncology History Overview Note  The patient began noticing postmenopausal bleeding in June, 2017. She saw her primary care physician who determined there was no blood in the urine, and she was then referred to Dr Benjie Karvonen who, on 11/21/15 performed a TVUS which showed a uterus measuring 6x2.7x3.3cm with normal ovaries. There was a thickened endometrium of 6.22m.    Endometrial cancer ([facility]  11/21/2015 Initial Diagnosis   Endometrial cancer ([facility] A pipelle endometrial biopsy was performed on 11/21/15 which showed FIGO grade 1-2 endometrial cancer.  The pap from the same date was normal.   12/30/2015 Surgery   Robotic assisted total hysterectomy, BSO, pelvic and PA SLN biopsy. 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  Postoperative Diagnosis: same.  Surgery: Total robotic hysterectomy bilateral salpingo-oophorectomy, left pelvic, right para-aortic and bifurcation SLN removal  Operative findings:  1) Colon adherent to posterior uterus, left sidewall and appendix 2) 2 right para-aortic nodes for SLN (high and PA node) 3) Aortic bifurcation nodes 4) Left obturator node  Diagnosis 1. Lymph node, sentinel, biopsy, right obturator - ONE BENIGN LYMPH NODE (0/1). 2. Lymph node, sentinel, biopsy, right peri-aortic - ONE BENIGN LYMPH NODE (0/1). 3. Lymph node, sentinel, biopsy, right lower peri-aortic - ONE BENIGN LYMPH NODE (0/1). 4. Lymph node, sentinel, biopsy, aortic bifurcation - ONE BENIGN LYMPH NODE (0/1). 5. Lymph node, sentinel, biopsy, left obturator - ONE BENIGN LYMPH NODE (0/1). 6. Uterus +/- tubes/ovaries, neoplastic - ENDOMETRIAL ADENOCARCINOMA, 1.7 CM WITH SUPERFICIAL MYOMETRIAL INVASION. -  MARGINS NOT INVOLVED. - CERVIX, BILATERAL OVARIES AND BILATERAL FALLOPIAN TUBES FREE OF TUMOR. Microscopic Comment 6. ONCOLOGY TABLE-UTERUS, CARCINOMA OR CARCINOSARCOMA Specimen: Uterus  with bilateral fallopian tubes and ovaries and sentinel lymph node biopsies. Procedure: Hysterectomy with sentinel lymph nodes. Lymph node sampling performed: Yes. Specimen integrity: Intact. Maximum tumor size: 1.7 cm Histologic type: Endometrioid with squamous differentiation. Grade: 2 Myometrial invasion: 0.3 cm where myometrium is 1 cm in thickness Cervical stromal involvement: No. Extent of involvement of other organs: None, identified. Lymph - vascular invasion: Not identified.    05/13/2016 Imaging   05/03/16 CT guided drainage: IMPRESSION: Successful CT guided aspiration of approximately 15 cc of serous, slightly cloudy / milky fluid from the residual collection within the left hemipelvis. All aspirated samples were sent to the laboratory for cytologic and gram stain analysis.  Cytology was negative   07/2017 Progression   Shannon Scott in February 2019 presented to her primary physician with complaints of rectal bleeding.  She was scheduled for a colonoscopy and had taken the bowel prep when she presented with obstruction of her distal sigmoid colon.   08/18/2017 Imaging   maging 08/18/2017 The left ureter is dilated to the left hemipelvis. It becomes narrowed in the inflammatory pelvic mass. This is described below. Bladder is within normal limits.   Stomach/Bowel: Large hiatal hernia is unchanged. Stable prominent gastric folds within the hiatal hernia.   The colon is diffusely distended. Distal small bowel is distended. A transition point between dilated large bowel and decompressed large bowel occurs in the sigmoid colon. There is a heterogeneous ill-defined mass involving the sigmoid colon and left side of the pelvis which includes the colon wall, adjacent fat, and both fluid and gas elements. Findings are suspicious for a focal perforation with abscess formation. Underlying malignancy is a strong consideration given the appearance and colon obstruction. The mass also  involves the left ureter causing an element of left ureteral dilatation worrisome for an element of left ureteral obstruction. The gas and fluid collection is very small measuring 1.8 x 1.2 cm on  image 109 of series 4. Overall mass size including the involved colon is 5.7 x 5.3 cm.    08/21/2017 Surgery   Shannon Scott underwent a diverting laparoscopic transverse loop colostomy and subsequent placement of a left ureteral stent.   08/2017 -  Radiation Therapy   She then received pelvic external beam radiation therapy    10/12/2017 - 11/23/2017 Chemotherapy   The patient had palonosetron (ALOXI) injection 0.25 mg, 0.25 mg, Intravenous,  Once, 6 of 6 cycles Administration: 0.25 mg (10/13/2017), 0.25 mg (10/20/2017), 0.25 mg (11/03/2017), 0.25 mg (11/10/2017), 0.25 mg (11/17/2017) CARBOplatin (PARAPLATIN) 150 mg in sodium chloride 0.9 % 100 mL chemo infusion, 150 mg (100 % of original dose 148 mg), Intravenous,  Once, 6 of 6 cycles Dose modification:   (original dose 148 mg, Cycle 1) Administration: 150 mg (10/13/2017), 150 mg (10/20/2017), 150 mg (11/03/2017), 150 mg (11/10/2017), 150 mg (11/17/2017) PACLitaxel (TAXOL) 132 mg in sodium chloride 0.9 % 250 mL chemo infusion (</= 77m/m2), 80 mg/m2 = 132 mg, Intravenous,  Once, 6 of 6 cycles Dose modification: 45 mg/m2 (original dose 80 mg/m2, Cycle 2, Reason: Other (see comments), Comment: Decreasing dose to 45 mg/m2 while undergoing XRT) Administration: 132 mg (10/13/2017), 72 mg (10/20/2017), 72 mg (11/03/2017), 72 mg (11/10/2017), 72 mg (11/17/2017)  for chemotherapy treatment.    01/18/2018 Imaging   notable for the presence of an IVC filter and 11  mm left internal iliac node that was previously 1.6 cm and a soft tissue mass in the left pelvic sidewall and now measuring 3.9 x 2.5 cm.   03/03/2018 Imaging   PET  demonstrates a hypermetabolic left pelvic sidewall mass, greater than 3 weeks after completion of radiotherapy.   03/16/2018 -  Chemotherapy   The  patient had pembrolizumab (KEYTRUDA) 200 mg in sodium chloride 0.9 % 50 mL chemo infusion, 200 mg, Intravenous, Once, 8 of 12 cycles Administration: 200 mg (03/16/2018), 200 mg (04/06/2018), 200 mg (05/04/2018), 200 mg (05/25/2018), 200 mg (10/10/2018), 200 mg (06/15/2018), 200 mg (07/27/2018), 200 mg (10/31/2018)  for chemotherapy treatment.    10/05/2018 Imaging   PET  3. Continued positive response to therapy within the left pelvic sidewall mass, which demonstrates residual low level hypermetabolism that has mildly decreased. 4. Resolved mediastinal nodal hypermetabolism. Nearly resolved bilateral hilar nodal hypermetabolism. These findings could represent resolving reactive nodes versus response to therapy within metastatic nodes. 5. No metabolic evidence of new sites of metastatic disease.       Review of Systems  Constitutional:  Denies fever. Skin: Fine macular nonblanching patches over the upper extremity  cardiovascular: No chest pain, shortness of breath, or edema  Pulmonary: No cough  Gastro Intestinal: Reporting stable parastomal hernia, with infrequent feeling of fullness in the area.  No nausea, vomiting, constipation, or diarrhea reported.  Reports passage of gas from the rectum, Genitourinary: Denies vaginal bleeding and discharge.  Musculoskeletal: Right hip pain, unbearable, Planned steroid injection. Reports swelling of the left lower extremity Neurologic: No weakness    Allergy:  Allergies  Allergen Reactions  . Statins Other (See Comments)    Myalgias and memory problems  . Ciprofloxacin Itching    Splotchy redness with itching during IV infusion localized to arm.    Social Hx:   Social History   Socioeconomic History  . Marital status: Widowed    Spouse name: Not on file  . Number of children: 2  . Years of education: Not on file  . Highest education level: Not on file  Occupational History    Employer: RETIRED  Social Needs  . Financial resource  strain: Not on file  . Food insecurity:    Worry: Not on file    Inability: Not on file  . Transportation needs:    Medical: Not on file    Non-medical: Not on file  Tobacco Use  . Smoking status: Never Smoker  . Smokeless tobacco: Never Used  Substance and Sexual Activity  . Alcohol use: No  . Drug use: No  . Sexual activity: Not on file  Lifestyle  . Physical activity:    Days per week: Not on file    Minutes per session: Not on file  . Stress: Not on file  Relationships  . Social connections:    Talks on phone: Not on file    Gets together: Not on file    Attends religious service: Not on file    Active member of club or organization: Not on file    Attends meetings of clubs or organizations: Not on file    Relationship status: Not on file  . Intimate partner violence:    Fear of current or ex partner: Not on file    Emotionally abused: Not on file    Physically abused: Not on file    Forced sexual activity: Not on file  Other Topics Concern  . Not on file  Social History Narrative  .  Not on file    Past Surgical Hx:  Past Surgical History:  Procedure Laterality Date  . ABDOMINAL HYSTERECTOMY    . Carotid Doppler  02/2012   40-59% right int carotid artery stenosis; 60-79% L int carotid stenosis; L carotid bruit  . CORONARY ARTERY BYPASS GRAFT  03/12/2004   LIMA to LAD, SVG to circumflex, SVG to PDA  . CYSTOSCOPY W/ URETERAL STENT PLACEMENT Left 12/06/2017   Procedure: CYSTOSCOPY WITH LEFT URETERAL STENT EXCHANGE;  Surgeon: Irine Seal, MD;  Location: [facility];  Service: Urology;  Laterality: Left;  . CYSTOSCOPY WITH STENT PLACEMENT Bilateral 08/21/2017   Procedure: CYSTOSCOPY WITH STENT PLACEMENT;  Surgeon: Ceasar Mons, MD;  Location: [facility];  Service: Urology;  Laterality: Bilateral;  . IR FLUORO GUIDE PORT INSERTION RIGHT  10/11/2017  . IR GENERIC HISTORICAL  04/29/2016   IR RADIOLOGIST EVAL & MGMT 04/29/2016 Sandi Mariscal, MD [facility]  . IR  GENERIC HISTORICAL  05/12/2016   IR RADIOLOGIST EVAL & MGMT 05/12/2016 Sandi Mariscal, MD [facility]  . IR IVC FILTER PLMT / S&I /IMG GUID/MOD SED  10/11/2017  . IR US GUIDE VASC ACCESS RIGHT  10/11/2017  . ROBOTIC ASSISTED TOTAL HYSTERECTOMY WITH BILATERAL SALPINGO OOPHERECTOMY Bilateral 12/30/2015   Procedure: XI ROBOTIC ASSISTED TOTAL HYSTERECTOMY WITH BILATERAL SALPINGO OOPHORECTOMY WITH SENTAL LYMPH NODE BIOPSY;  Surgeon: Nancy Marus, MD;  Location: [facility];  Service: Gynecology;  Laterality: Bilateral;  . TONSILLECTOMY    . TRANSTHORACIC ECHOCARDIOGRAM  04/2007   EF>55%; mild MR; mild-mod TR; mild pulm HTN; mild calcification of aortiv valve leaflets with mild valvular aortic stenosis  . TUBAL LIGATION      Past Medical Hx:  Past Medical History:  Diagnosis Date  . Arthritis   . Asthma    allergy induced  . Bilateral carotid artery disease (HCC)    L carotid bruit  . Bursitis    left hip  . CAD (coronary artery disease)   . Cancer ([place])   . Dyslipidemia    intolerant to statins, welchol, niacin, zetia  . Goals of care, counseling/discussion 10/11/2017  . History of blood transfusion   . History of nuclear stress test 04/24/2012   lexiscan; normal study  . Hypertension   . Hypothyroidism   . 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 ([place]) 10/11/2017  . Postoperative nausea and vomiting 01/02/2016    Past Gynecological History:  See HPI  No LMP recorded. Patient has had a hysterectomy.  Family Hx:  Family History  Problem Relation Age of Onset  . Hypertension Mother   . Heart disease Mother        Died in her 39s  . Stroke Father   . Kidney disease Brother   . Heart disease Brother        also HTN, hyperlipidemia  . Heart attack Brother   . Stroke Sister        x2  . Hypertension Sister     Vitals:  Blood pressure (!) 159/58, pulse 72, temperature 98.6 F (37 C), temperature source Oral, resp. rate 18, height 5' 1"  (1.549 m),  weight 138 lb 3.2 oz (62.7 kg), SpO2 97 %. Body mass index is 26.11 kg/m.   Physical Exam: WD in NAD CHEST:  CTA Abd.  Colostomy present with air and stool.  Parastomal hernia present.  Palpable masses or ascites Pelvic: Normal external genitalia Bartholin's urethra Skene's, No pelvic masses palpable.  Vagina is atrophic without any  lesions rectal good tone no palpable masses. Erythema of the mons Lymph nodes no cervical supraclavicular adenopathy Extremities 3-2+ left lower extremity edema extending to the knee Back: No CVA tenderness Musculoskeletal:  Significant right hip pain.

## 2018-11-09 ENCOUNTER — Encounter: Payer: Self-pay | Admitting: Gynecologic Oncology

## 2018-11-09 ENCOUNTER — Inpatient Hospital Stay (HOSPITAL_BASED_OUTPATIENT_CLINIC_OR_DEPARTMENT_OTHER): Payer: Medicare Other | Admitting: Gynecologic Oncology

## 2018-11-09 ENCOUNTER — Telehealth: Payer: Self-pay | Admitting: Family

## 2018-11-09 ENCOUNTER — Other Ambulatory Visit: Payer: Self-pay

## 2018-11-09 VITALS — BP 159/58 | HR 72 | Temp 98.6°F | Resp 18 | Ht 61.0 in | Wt 138.2 lb

## 2018-11-09 DIAGNOSIS — C541 Malignant neoplasm of endometrium: Secondary | ICD-10-CM

## 2018-11-09 DIAGNOSIS — R600 Localized edema: Secondary | ICD-10-CM | POA: Diagnosis not present

## 2018-11-09 DIAGNOSIS — Z90722 Acquired absence of ovaries, bilateral: Secondary | ICD-10-CM

## 2018-11-09 DIAGNOSIS — M549 Dorsalgia, unspecified: Secondary | ICD-10-CM | POA: Diagnosis not present

## 2018-11-09 DIAGNOSIS — G8929 Other chronic pain: Secondary | ICD-10-CM

## 2018-11-09 DIAGNOSIS — Z90710 Acquired absence of both cervix and uterus: Secondary | ICD-10-CM

## 2018-11-09 NOTE — Patient Instructions (Signed)
Plan to follow up with your Medical Oncologist Team. Plan to follow up with Dr. Skeet Latch in four months or sooner if needed.

## 2018-11-09 NOTE — Telephone Encounter (Signed)
Called spoke with patient regarding appt for 6/12 per 6/11 sch msg

## 2018-11-10 ENCOUNTER — Inpatient Hospital Stay (HOSPITAL_BASED_OUTPATIENT_CLINIC_OR_DEPARTMENT_OTHER): Payer: Medicare Other | Admitting: Family

## 2018-11-10 VITALS — BP 161/49 | HR 65 | Resp 17 | Wt 138.0 lb

## 2018-11-10 DIAGNOSIS — C539 Malignant neoplasm of cervix uteri, unspecified: Secondary | ICD-10-CM

## 2018-11-10 DIAGNOSIS — Z79899 Other long term (current) drug therapy: Secondary | ICD-10-CM

## 2018-11-10 DIAGNOSIS — G8929 Other chronic pain: Secondary | ICD-10-CM

## 2018-11-10 DIAGNOSIS — Z5112 Encounter for antineoplastic immunotherapy: Secondary | ICD-10-CM

## 2018-11-10 DIAGNOSIS — M25552 Pain in left hip: Secondary | ICD-10-CM

## 2018-11-10 DIAGNOSIS — Z90722 Acquired absence of ovaries, bilateral: Secondary | ICD-10-CM

## 2018-11-10 DIAGNOSIS — C541 Malignant neoplasm of endometrium: Secondary | ICD-10-CM | POA: Diagnosis not present

## 2018-11-10 DIAGNOSIS — Z933 Colostomy status: Secondary | ICD-10-CM

## 2018-11-10 DIAGNOSIS — Z90710 Acquired absence of both cervix and uterus: Secondary | ICD-10-CM

## 2018-11-10 DIAGNOSIS — R61 Generalized hyperhidrosis: Secondary | ICD-10-CM | POA: Diagnosis not present

## 2018-11-10 DIAGNOSIS — R600 Localized edema: Secondary | ICD-10-CM

## 2018-11-10 DIAGNOSIS — R232 Flushing: Secondary | ICD-10-CM

## 2018-11-10 DIAGNOSIS — M792 Neuralgia and neuritis, unspecified: Secondary | ICD-10-CM

## 2018-11-10 DIAGNOSIS — Z923 Personal history of irradiation: Secondary | ICD-10-CM

## 2018-11-10 DIAGNOSIS — M549 Dorsalgia, unspecified: Secondary | ICD-10-CM

## 2018-11-10 DIAGNOSIS — R21 Rash and other nonspecific skin eruption: Secondary | ICD-10-CM

## 2018-11-10 DIAGNOSIS — S3210XA Unspecified fracture of sacrum, initial encounter for closed fracture: Secondary | ICD-10-CM

## 2018-11-10 DIAGNOSIS — M25551 Pain in right hip: Secondary | ICD-10-CM

## 2018-11-10 MED ORDER — GABAPENTIN 100 MG PO CAPS: 100.0000 mg | ORAL_CAPSULE | Freq: Two times a day (BID) | ORAL | 3 refills | Status: DC

## 2018-11-10 NOTE — Progress Notes (Signed)
Hematology and Oncology Follow Up Visit  GLYNIS HUNSUCKER [id number] 1941-02-23 78 y.o. 11/10/2018   Principle Diagnosis:  Locally advanced/recurrent endometrial carcinoma undifferentiated -- TMB (HIGH) / MSI HIGH  Past Therapy: Radiation therapyfor 5 -5 1/2 weeks - s/p 20 fractions Taxol/carboplatinumq 7 days -- s/p cycle 6   Current Therapy:   Pembrolizumab 200 mg IV q 3 wk -- s/p cycle 8 Zometa 4 mg IV q 3 months -- next dose in 12/2018 IV iron as indicated - last received 11/10/2017   Interim History:  Ms. Harb is here today for symptom management. She had noticed hot flashes and sweating that dampens her clothes so she has to change.  The pain in her tail bone and buttocks seems a little more prominent the last few days. She has been taking Ultram in the morning and at bedtime and will take 1-2 Tylenol a day for the pain. She states that this regimen has helped.  Her skin rash is much improved and she does not want to take another steroid dose pack. We will have her try hydrocortisone cream on the still reddened spot to help dry them out. She denies itching. There are no open lesions or sores noted on exam.  No fever, n/v, cough, dizziness, SOB, chest pain, palpitations, abdominal pain or changes in bowel or bladder habits.  No tenderness, numbness or tingling in her extremities.  Her lower extremity edema is stable and she has been referred to a lymphedema clinic. She does not like wearing her support hose. She will continues to elevate her feet if possible when sitting.  She is ambulating with a cane for support. No falls or syncopal episodes to report.  No episodes of bleeding, no bruising or petechiae.  She states that her appetite has improved and she is doing her best to stay well hydrated.   ECOG Performance Status: 2 - Symptomatic, <50% confined to bed  Medications:  Allergies as of 11/10/2018      Reactions   Statins Other (See Comments)   Myalgias and  memory problems   Ciprofloxacin Itching   Splotchy redness with itching during IV infusion localized to arm.      Medication List       Accurate as of November 10, 2018  1:33 PM. If you have any questions, ask your nurse or doctor.        amoxicillin 500 MG tablet Commonly known as: AMOXIL Take four tablets prior to dental procedure   diclofenac sodium 1 % Gel Commonly known as: VOLTAREN Apply 2 g topically 4 (four) times daily.   diltiazem 300 MG 24 hr capsule Commonly known as: TIAZAC TAKE 1 CAPSULE(300 MG) BY MOUTH DAILY What changed: See the new instructions.   gabapentin 100 MG capsule Commonly known as: Neurontin Take 1 capsule (100 mg total) by mouth 4 (four) times daily.   ibuprofen 200 MG tablet Commonly known as: ADVIL Take 200 mg by mouth daily as needed (back pain).   lactose free nutrition Liqd Take 237 mLs by mouth 3 (three) times daily with meals.   levothyroxine 50 MCG tablet Commonly known as: SYNTHROID Take 1 tablet (50 mcg total) by mouth daily before breakfast.   lidocaine-prilocaine cream Commonly known as: EMLA Apply 1 application topically as needed.   lipase/protease/amylase 36000 UNITS Cpep capsule Commonly known as: Creon Take 2 capsules (72,000 Units total) by mouth 3 (three) times daily before meals.   ondansetron 8 MG tablet Commonly known as: ZOFRAN TAKE  1 TABLET(8 MG) BY MOUTH EVERY 8 HOURS AS NEEDED FOR NAUSEA OR VOMITING   pantoprazole 40 MG tablet Commonly known as: PROTONIX Take 1 tablet (40 mg total) by mouth daily. What changed:   when to take this  reasons to take this   polyethylene glycol 17 g packet Commonly known as: MIRALAX / GLYCOLAX Take 17 g by mouth daily.   prochlorperazine 10 MG tablet Commonly known as: COMPAZINE Take 1 tablet (10 mg total) by mouth every 6 (six) hours as needed for nausea or vomiting.   senna-docusate 8.6-50 MG tablet Commonly known as: Senokot-S Take 1 tablet by mouth 2 (two) times  daily.   traMADol 50 MG tablet Commonly known as: ULTRAM Take 2 tablets (100 mg total) by mouth every 6 (six) hours as needed.       Allergies:  Allergies  Allergen Reactions  . Statins Other (See Comments)    Myalgias and memory problems  . Ciprofloxacin Itching    Splotchy redness with itching during IV infusion localized to arm.    Past Medical History, Surgical history, Social history, and Family History were reviewed and updated.  Review of Systems: All other 10 point review of systems is negative.   Physical Exam:  weight is 138 lb (62.6 kg). Her blood pressure is 161/49 (abnormal) and her pulse is 65. Her respiration is 17 and oxygen saturation is 99%.   Wt Readings from Last 3 Encounters:  11/10/18 138 lb (62.6 kg)  11/09/18 138 lb 3.2 oz (62.7 kg)  10/31/18 134 lb (60.8 kg)    Ocular: Sclerae unicteric, pupils equal, round and reactive to light Ear-nose-throat: Oropharynx clear, dentition fair Lymphatic: No cervical or supraclavicular adenopathy Lungs no rales or rhonchi, good excursion bilaterally Heart regular rate and rhythm, no murmur appreciated Abd soft, nontender, positive bowel sounds, no liver or spleen tip palpated on exam, no fluid wave, ostomy functioning appropriately and hernia present above stoma MSK no focal spinal tenderness, no joint edema Neuro: non-focal, well-oriented, appropriate affect Breasts: Deferred   Lab Results  Component Value Date   WBC 6.5 10/31/2018   HGB 13.2 10/31/2018   HCT 39.6 10/31/2018   MCV 96.6 10/31/2018   PLT 281 10/31/2018   Lab Results  Component Value Date   FERRITIN 544 (H) 10/31/2018   IRON 71 10/31/2018   TIBC 216 (L) 10/31/2018   UIBC 145 10/31/2018   IRONPCTSAT 33 10/31/2018   Lab Results  Component Value Date   RETICCTPCT 0.5 (L) 10/20/2017   RBC 4.10 10/31/2018   No results found for: KPAFRELGTCHN, LAMBDASER, KAPLAMBRATIO No results found for: IGGSERUM, IGA, IGMSERUM No results found for:  Odetta Pink, SPEI   Chemistry      Component Value Date/Time   NA 137 10/31/2018 1140   NA 136 (A) 09/08/2017   K 3.3 (L) 10/31/2018 1140   CL 100 10/31/2018 1140   CO2 29 10/31/2018 1140   BUN 20 10/31/2018 1140   BUN 8 09/08/2017   CREATININE 0.80 10/31/2018 1140   CREATININE 0.69 08/02/2016 1519   GLU 111 09/08/2017      Component Value Date/Time   CALCIUM 9.0 10/31/2018 1140   ALKPHOS 58 10/31/2018 1140   AST 10 (L) 10/31/2018 1140   ALT 9 10/31/2018 1140   BILITOT 0.5 10/31/2018 1140       Impression and Plan: Ms. Hanover is a very pleasant 78 yo caucasian female with recurrent undifferentiated endometrial carcinoma.  She  is having issues with sweating and pain in the tail bone. She has chronic pain associated with secondary to fractures of the sacrum.  She had stopped taking Neurontin due to it making her "legs feel funny". We discussed reducing her dose from 100 mg QID to BID and she is in agreement with this. We discussed side effects and she will contact our office with any questions or concerns.  We will plan see her back on 6/23 at her next scheduled appointment.   Laverna Peace, NP 6/12/20201:33 PM

## 2018-11-13 ENCOUNTER — Telehealth: Payer: Self-pay | Admitting: Family

## 2018-11-13 NOTE — Telephone Encounter (Signed)
No los 6/12

## 2018-11-21 ENCOUNTER — Inpatient Hospital Stay: Payer: Medicare Other

## 2018-11-21 ENCOUNTER — Other Ambulatory Visit: Payer: Self-pay

## 2018-11-21 ENCOUNTER — Inpatient Hospital Stay (HOSPITAL_BASED_OUTPATIENT_CLINIC_OR_DEPARTMENT_OTHER): Payer: Medicare Other | Admitting: Family

## 2018-11-21 VITALS — BP 154/78 | HR 66 | Resp 19 | Wt 138.0 lb

## 2018-11-21 DIAGNOSIS — R600 Localized edema: Secondary | ICD-10-CM

## 2018-11-21 DIAGNOSIS — M549 Dorsalgia, unspecified: Secondary | ICD-10-CM

## 2018-11-21 DIAGNOSIS — M25552 Pain in left hip: Secondary | ICD-10-CM

## 2018-11-21 DIAGNOSIS — Z923 Personal history of irradiation: Secondary | ICD-10-CM

## 2018-11-21 DIAGNOSIS — C541 Malignant neoplasm of endometrium: Secondary | ICD-10-CM | POA: Diagnosis not present

## 2018-11-21 DIAGNOSIS — Z79899 Other long term (current) drug therapy: Secondary | ICD-10-CM

## 2018-11-21 DIAGNOSIS — S3210XA Unspecified fracture of sacrum, initial encounter for closed fracture: Secondary | ICD-10-CM

## 2018-11-21 DIAGNOSIS — Z90710 Acquired absence of both cervix and uterus: Secondary | ICD-10-CM

## 2018-11-21 DIAGNOSIS — E039 Hypothyroidism, unspecified: Secondary | ICD-10-CM

## 2018-11-21 DIAGNOSIS — D508 Other iron deficiency anemias: Secondary | ICD-10-CM

## 2018-11-21 DIAGNOSIS — Z5112 Encounter for antineoplastic immunotherapy: Secondary | ICD-10-CM | POA: Diagnosis not present

## 2018-11-21 DIAGNOSIS — C799 Secondary malignant neoplasm of unspecified site: Secondary | ICD-10-CM

## 2018-11-21 DIAGNOSIS — Z933 Colostomy status: Secondary | ICD-10-CM

## 2018-11-21 DIAGNOSIS — M25551 Pain in right hip: Secondary | ICD-10-CM

## 2018-11-21 DIAGNOSIS — C539 Malignant neoplasm of cervix uteri, unspecified: Secondary | ICD-10-CM

## 2018-11-21 DIAGNOSIS — R21 Rash and other nonspecific skin eruption: Secondary | ICD-10-CM

## 2018-11-21 DIAGNOSIS — R61 Generalized hyperhidrosis: Secondary | ICD-10-CM | POA: Diagnosis not present

## 2018-11-21 DIAGNOSIS — Z90722 Acquired absence of ovaries, bilateral: Secondary | ICD-10-CM

## 2018-11-21 DIAGNOSIS — R232 Flushing: Secondary | ICD-10-CM

## 2018-11-21 DIAGNOSIS — G8929 Other chronic pain: Secondary | ICD-10-CM

## 2018-11-21 LAB — CBC WITH DIFFERENTIAL (CANCER CENTER ONLY)
Abs Immature Granulocytes: 0.01 10*3/uL (ref 0.00–0.07)
Basophils Absolute: 0 10*3/uL (ref 0.0–0.1)
Basophils Relative: 0 %
Eosinophils Absolute: 0.1 10*3/uL (ref 0.0–0.5)
Eosinophils Relative: 1 %
HCT: 35.3 % — ABNORMAL LOW (ref 36.0–46.0)
Hemoglobin: 11.7 g/dL — ABNORMAL LOW (ref 12.0–15.0)
Immature Granulocytes: 0 %
Lymphocytes Relative: 12 %
Lymphs Abs: 0.5 10*3/uL — ABNORMAL LOW (ref 0.7–4.0)
MCH: 31.7 pg (ref 26.0–34.0)
MCHC: 33.1 g/dL (ref 30.0–36.0)
MCV: 95.7 fL (ref 80.0–100.0)
Monocytes Absolute: 0.4 10*3/uL (ref 0.1–1.0)
Monocytes Relative: 9 %
Neutro Abs: 3.3 10*3/uL (ref 1.7–7.7)
Neutrophils Relative %: 78 %
Platelet Count: 272 10*3/uL (ref 150–400)
RBC: 3.69 MIL/uL — ABNORMAL LOW (ref 3.87–5.11)
RDW: 13 % (ref 11.5–15.5)
WBC Count: 4.3 10*3/uL (ref 4.0–10.5)
nRBC: 0 % (ref 0.0–0.2)

## 2018-11-21 LAB — LACTATE DEHYDROGENASE: LDH: 162 U/L (ref 98–192)

## 2018-11-21 LAB — CMP (CANCER CENTER ONLY)
ALT: 7 U/L (ref 0–44)
AST: 13 U/L — ABNORMAL LOW (ref 15–41)
Albumin: 4.1 g/dL (ref 3.5–5.0)
Alkaline Phosphatase: 68 U/L (ref 38–126)
Anion gap: 9 (ref 5–15)
BUN: 13 mg/dL (ref 8–23)
CO2: 26 mmol/L (ref 22–32)
Calcium: 9.3 mg/dL (ref 8.9–10.3)
Chloride: 101 mmol/L (ref 98–111)
Creatinine: 0.73 mg/dL (ref 0.44–1.00)
GFR, Est AFR Am: >60 mL/min (ref >60)
GFR, Estimated: >60 mL/min (ref >60)
Glucose, Bld: 93 mg/dL (ref 70–99)
Potassium: 3.4 mmol/L — ABNORMAL LOW (ref 3.5–5.1)
Sodium: 136 mmol/L (ref 135–145)
Total Bilirubin: 0.3 mg/dL (ref 0.3–1.2)
Total Protein: 6.4 g/dL — ABNORMAL LOW (ref 6.5–8.1)

## 2018-11-21 MED ORDER — SODIUM CHLORIDE 0.9 % IV SOLN
Freq: Once | INTRAVENOUS | Status: AC
  Administered 2018-11-21: 12:00:00 via INTRAVENOUS
  Filled 2018-11-21: qty 250

## 2018-11-21 MED ORDER — SODIUM CHLORIDE 0.9% FLUSH
10.0000 mL | INTRAVENOUS | Status: DC | PRN
  Administered 2018-11-21: 10 mL
  Filled 2018-11-21: qty 10

## 2018-11-21 MED ORDER — SODIUM CHLORIDE 0.9 % IV SOLN
200.0000 mg | Freq: Once | INTRAVENOUS | Status: AC
  Administered 2018-11-21: 200 mg via INTRAVENOUS
  Filled 2018-11-21: qty 8

## 2018-11-21 MED ORDER — HEPARIN SOD (PORK) LOCK FLUSH 100 UNIT/ML IV SOLN
500.0000 [IU] | Freq: Once | INTRAVENOUS | Status: AC | PRN
  Administered 2018-11-21: 500 [IU]
  Filled 2018-11-21: qty 5

## 2018-11-21 NOTE — Patient Instructions (Signed)
[facility] Discharge Instructions for Patients Receiving Chemotherapy  Today you received the following chemotherapy agents :  Keytruda.  To help prevent nausea and vomiting after your treatment, we encourage you to take your nausea medication as prescribed.   If you develop nausea and vomiting that is not controlled by your nausea medication, call the clinic.   BELOW ARE SYMPTOMS THAT SHOULD BE REPORTED IMMEDIATELY:  *FEVER GREATER THAN 100.5 F  *CHILLS WITH OR WITHOUT FEVER  NAUSEA AND VOMITING THAT IS NOT CONTROLLED WITH YOUR NAUSEA MEDICATION  *UNUSUAL SHORTNESS OF BREATH  *UNUSUAL BRUISING OR BLEEDING  TENDERNESS IN MOUTH AND THROAT WITH OR WITHOUT PRESENCE OF ULCERS  *URINARY PROBLEMS  *BOWEL PROBLEMS  UNUSUAL RASH Items with * indicate a potential emergency and should be followed up as soon as possible.  Feel free to call the clinic should you have any questions or concerns. The clinic phone number is [phone].  Please show the CHEMO ALERT CARD at check-in to the Emergency Department and triage nurse.  

## 2018-11-21 NOTE — Progress Notes (Addendum)
Hematology and Oncology Follow Up Visit  Shannon Scott [id number] 01-12-1941 78 y.o. 11/21/2018   Principle Diagnosis:  Locally advanced/recurrent endometrial carcinoma undifferentiated -- TMB (HIGH) / MSI HIGH  Past Therapy: Radiation therapyfor 5 -5 1/2 weeks - s/p 20 fractions Taxol/carboplatinumq 7 days -- s/p cycle 6  Current Therapy:   Pembrolizumab 200 mg IV q 3 wk -- s/p cycle8 Zometa 4 mg IV q 3 months -- next dose in 12/2018 IV iron as indicated - last received 11/10/2017   Interim History:  Shannon Scott is here today for follow-up and treatment. She is doing well and has no complaints at this time.  No issues with infection. No fever, chills, n/v, cough, rash, dizziness, SOB, chest pain, palpitations, abdominal pain or changes in bowel or bladder habits.  She states that she takes Miralax daily to help her bowels move. Her ostomy is functioning appropriately.  The swelling in her lower extremities is stable. No redness or pitting edema.  No tenderness, numbness or tingling in her extremities at this time.  No lymphadenopathy noted on exam.  No falls or syncopal episodes.  No episodes of bleeding. She does bruise easily on her arms but not in excess.  She is eating well and staying hydrated. Her weight is stable.   ECOG Performance Status: 2 - Symptomatic, <50% confined to bed  Medications:  Allergies as of 11/21/2018      Reactions   Statins Other (See Comments)   Myalgias and memory problems   Ciprofloxacin Itching   Splotchy redness with itching during IV infusion localized to arm.      Medication List       Accurate as of November 21, 2018 12:29 PM. If you have any questions, ask your nurse or doctor.        amoxicillin 500 MG tablet Commonly known as: AMOXIL Take four tablets prior to dental procedure   diclofenac sodium 1 % Gel Commonly known as: VOLTAREN Apply 2 g topically 4 (four) times daily.   diltiazem 300 MG 24 hr capsule Commonly  known as: TIAZAC TAKE 1 CAPSULE(300 MG) BY MOUTH DAILY What changed: See the new instructions.   gabapentin 100 MG capsule Commonly known as: Neurontin Take 1 capsule (100 mg total) by mouth 2 (two) times daily.   gabapentin 100 MG capsule Commonly known as: NEURONTIN   ibuprofen 200 MG tablet Commonly known as: ADVIL Take 200 mg by mouth daily as needed (back pain).   lactose free nutrition Liqd Take 237 mLs by mouth 3 (three) times daily with meals.   levothyroxine 50 MCG tablet Commonly known as: SYNTHROID Take 1 tablet (50 mcg total) by mouth daily before breakfast.   lidocaine-prilocaine cream Commonly known as: EMLA Apply 1 application topically as needed.   lipase/protease/amylase 36000 UNITS Cpep capsule Commonly known as: Creon Take 2 capsules (72,000 Units total) by mouth 3 (three) times daily before meals.   ondansetron 8 MG tablet Commonly known as: ZOFRAN TAKE 1 TABLET(8 MG) BY MOUTH EVERY 8 HOURS AS NEEDED FOR NAUSEA OR VOMITING   pantoprazole 40 MG tablet Commonly known as: PROTONIX Take 1 tablet (40 mg total) by mouth daily. What changed:   when to take this  reasons to take this   polyethylene glycol 17 g packet Commonly known as: MIRALAX / GLYCOLAX Take 17 g by mouth daily.   prochlorperazine 10 MG tablet Commonly known as: COMPAZINE Take 1 tablet (10 mg total) by mouth every 6 (six) hours as needed  for nausea or vomiting.   senna-docusate 8.6-50 MG tablet Commonly known as: Senokot-S Take 1 tablet by mouth 2 (two) times daily.   traMADol 50 MG tablet Commonly known as: ULTRAM Take 2 tablets (100 mg total) by mouth every 6 (six) hours as needed.   traMADol 50 MG tablet Commonly known as: ULTRAM       Allergies:  Allergies  Allergen Reactions  . Statins Other (See Comments)    Myalgias and memory problems  . Ciprofloxacin Itching    Splotchy redness with itching during IV infusion localized to arm.    Past Medical History,  Surgical history, Social history, and Family History were reviewed and updated.  Review of Systems: All other 10 point review of systems is negative.   Physical Exam:  weight is 138 lb (62.6 kg). Her blood pressure is 154/78 (abnormal) and her pulse is 66. Her respiration is 19 and oxygen saturation is 98%.   Wt Readings from Last 3 Encounters:  11/21/18 138 lb (62.6 kg)  11/10/18 138 lb (62.6 kg)  11/09/18 138 lb 3.2 oz (62.7 kg)    Ocular: Sclerae unicteric, pupils equal, round and reactive to light Ear-nose-throat: Oropharynx clear, dentition fair Lymphatic: No cervical or supraclavicular adenopathy Lungs no rales or rhonchi, good excursion bilaterally Heart regular rate and rhythm, no murmur appreciated Abd soft, nontender, positive bowel sounds, no liver or spleen tip palpated on exam, no fluid wave, umbilical hernia present, stoma intact MSK no focal spinal tenderness, no joint edema Neuro: non-focal, well-oriented, appropriate affect Breasts: Deferred   Lab Results  Component Value Date   WBC 4.3 11/21/2018   HGB 11.7 (L) 11/21/2018   HCT 35.3 (L) 11/21/2018   MCV 95.7 11/21/2018   PLT 272 11/21/2018   Lab Results  Component Value Date   FERRITIN 544 (H) 10/31/2018   IRON 71 10/31/2018   TIBC 216 (L) 10/31/2018   UIBC 145 10/31/2018   IRONPCTSAT 33 10/31/2018   Lab Results  Component Value Date   RETICCTPCT 0.5 (L) 10/20/2017   RBC 3.69 (L) 11/21/2018   No results found for: KPAFRELGTCHN, LAMBDASER, KAPLAMBRATIO No results found for: IGGSERUM, IGA, IGMSERUM No results found for: Odetta Pink, SPEI   Chemistry      Component Value Date/Time   NA 136 11/21/2018 1115   NA 136 (A) 09/08/2017   K 3.4 (L) 11/21/2018 1115   CL 101 11/21/2018 1115   CO2 26 11/21/2018 1115   BUN 13 11/21/2018 1115   BUN 8 09/08/2017   CREATININE 0.73 11/21/2018 1115   CREATININE 0.69 08/02/2016 1519   GLU 111 09/08/2017       Component Value Date/Time   CALCIUM 9.3 11/21/2018 1115   ALKPHOS 68 11/21/2018 1115   AST 13 (L) 11/21/2018 1115   ALT 7 11/21/2018 1115   BILITOT 0.3 11/21/2018 1115       Impression and Plan: Shannon Scott is a very pleasant 78 yo caucasian female withrecurrent undifferentiated endometrial carcinoma. She is doing well at this time and we will proceed with treatment today as planned.  We will see her back in another 3 weeks.  She will contact our office with any questions or concerns. We can certainly see her sooner if needed.   Laverna Peace, NP 6/23/202012:29 PM

## 2018-11-21 NOTE — Patient Instructions (Signed)
Implanted Port Insertion, Care After  This sheet gives you information about how to care for yourself after your procedure. Your health care provider may also give you more specific instructions. If you have problems or questions, contact your health care provider.  What can I expect after the procedure?  After the procedure, it is common to have:  · Discomfort at the port insertion site.  · Bruising on the skin over the port. This should improve over 3-4 days.  Follow these instructions at home:  Port care  · After your port is placed, you will get a manufacturer's information card. The card has information about your port. Keep this card with you at all times.  · Take care of the port as told by your health care provider. Ask your health care provider if you or a family member can get training for taking care of the port at home. A home health care nurse may also take care of the port.  · Make sure to remember what type of port you have.  Incision care         · Follow instructions from your health care provider about how to take care of your port insertion site. Make sure you:  ? Wash your hands with soap and water before and after you change your bandage (dressing). If soap and water are not available, use hand sanitizer.  ? Change your dressing as told by your health care provider.  ? Leave stitches (sutures), skin glue, or adhesive strips in place. These skin closures may need to stay in place for 2 weeks or longer. If adhesive strip edges start to loosen and curl up, you may trim the loose edges. Do not remove adhesive strips completely unless your health care provider tells you to do that.  · Check your port insertion site every day for signs of infection. Check for:  ? Redness, swelling, or pain.  ? Fluid or blood.  ? Warmth.  ? Pus or a bad smell.  Activity  · Return to your normal activities as told by your health care provider. Ask your health care provider what activities are safe for you.  · Do not  lift anything that is heavier than 10 lb (4.5 kg), or the limit that you are told, until your health care provider says that it is safe.  General instructions  · Take over-the-counter and prescription medicines only as told by your health care provider.  · Do not take baths, swim, or use a hot tub until your health care provider approves. Ask your health care provider if you may take showers. You may only be allowed to take sponge baths.  · Do not drive for 24 hours if you were given a sedative during your procedure.  · Wear a medical alert bracelet in case of an emergency. This will tell any health care providers that you have a port.  · Keep all follow-up visits as told by your health care provider. This is important.  Contact a health care provider if:  · You cannot flush your port with saline as directed, or you cannot draw blood from the port.  · You have a fever or chills.  · You have redness, swelling, or pain around your port insertion site.  · You have fluid or blood coming from your port insertion site.  · Your port insertion site feels warm to the touch.  · You have pus or a bad smell coming from the port   insertion site.  Get help right away if:  · You have chest pain or shortness of breath.  · You have bleeding from your port that you cannot control.  Summary  · Take care of the port as told by your health care provider. Keep the manufacturer's information card with you at all times.  · Change your dressing as told by your health care provider.  · Contact a health care provider if you have a fever or chills or if you have redness, swelling, or pain around your port insertion site.  · Keep all follow-up visits as told by your health care provider.  This information is not intended to replace advice given to you by your health care provider. Make sure you discuss any questions you have with your health care provider.  Document Released: 03/07/2013 Document Revised: 12/13/2017 Document Reviewed:  12/13/2017  Elsevier Interactive Patient Education © 2019 [employer]

## 2018-11-22 LAB — TSH: TSH: 1.243 u[IU]/mL (ref 0.308–3.960)

## 2018-11-22 LAB — FERRITIN: Ferritin: 501 ng/mL — ABNORMAL HIGH (ref 11–307)

## 2018-11-22 LAB — IRON AND TIBC
Iron: 53 ug/dL (ref 41–142)
Saturation Ratios: 25 % (ref 21–57)
TIBC: 214 ug/dL — ABNORMAL LOW (ref 236–444)
UIBC: 161 ug/dL (ref 120–384)

## 2018-11-24 ENCOUNTER — Ambulatory Visit: Payer: Medicare Other | Admitting: Rehabilitation

## 2018-11-27 ENCOUNTER — Ambulatory Visit: Payer: Medicare Other | Attending: Gynecologic Oncology | Admitting: Rehabilitation

## 2018-11-27 ENCOUNTER — Encounter: Payer: Self-pay | Admitting: Rehabilitation

## 2018-11-27 ENCOUNTER — Other Ambulatory Visit: Payer: Self-pay

## 2018-11-27 DIAGNOSIS — I890 Lymphedema, not elsewhere classified: Secondary | ICD-10-CM | POA: Insufficient documentation

## 2018-11-27 NOTE — Therapy (Signed)
[place], [place], [address] Phone: [phone]   Fax:  [phone]  Physical Therapy Evaluation  Patient Details  Name: Shannon Scott MRN: [id number] Date of Birth: 1940/09/18 Referring Provider (PT): [name] NP   Encounter Date: 11/27/2018  PT End of Session - 11/27/18 2137    Visit Number  1    Number of Visits  12    Date for PT Re-Evaluation  12/25/18    Authorization Type  [employer] medicare    PT Start Time  1600    PT Stop Time  1640    PT Time Calculation (min)  40 min    Activity Tolerance  Patient tolerated treatment well    Behavior During Therapy  [facility] for tasks assessed/performed       Past Medical History:  Diagnosis Date  . Arthritis   . Asthma    allergy induced  . Bilateral carotid artery disease (HCC)    L carotid bruit  . Bursitis    left hip  . CAD (coronary artery disease)   . Cancer ([place])   . Dyslipidemia    intolerant to statins, welchol, niacin, zetia  . Goals of care, counseling/discussion 10/11/2017  . History of blood transfusion   . History of nuclear stress test 04/24/2012   lexiscan; normal study  . Hypertension   . Hypothyroidism   . 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 ([place]) 10/11/2017  . Postoperative nausea and vomiting 01/02/2016    Past Surgical History:  Procedure Laterality Date  . ABDOMINAL HYSTERECTOMY    . Carotid Doppler  02/2012   40-59% right int carotid artery stenosis; 60-79% L int carotid stenosis; L carotid bruit  . CORONARY ARTERY BYPASS GRAFT  03/12/2004   LIMA to LAD, SVG to circumflex, SVG to PDA  . CYSTOSCOPY W/ URETERAL STENT PLACEMENT Left 12/06/2017   Procedure: CYSTOSCOPY WITH LEFT URETERAL STENT EXCHANGE;  Surgeon: Irine Seal, MD;  Location: [facility];  Service: Urology;  Laterality: Left;  . CYSTOSCOPY WITH STENT PLACEMENT Bilateral 08/21/2017   Procedure: CYSTOSCOPY WITH STENT  PLACEMENT;  Surgeon: Ceasar Mons, MD;  Location: [facility];  Service: Urology;  Laterality: Bilateral;  . IR FLUORO GUIDE PORT INSERTION RIGHT  10/11/2017  . IR GENERIC HISTORICAL  04/29/2016   IR RADIOLOGIST EVAL & MGMT 04/29/2016 Sandi Mariscal, MD [facility]  . IR GENERIC HISTORICAL  05/12/2016   IR RADIOLOGIST EVAL & MGMT 05/12/2016 Sandi Mariscal, MD [facility]  . IR IVC FILTER PLMT / S&I /IMG GUID/MOD SED  10/11/2017  . IR US GUIDE VASC ACCESS RIGHT  10/11/2017  . ROBOTIC ASSISTED TOTAL HYSTERECTOMY WITH BILATERAL SALPINGO OOPHERECTOMY Bilateral 12/30/2015   Procedure: XI ROBOTIC ASSISTED TOTAL HYSTERECTOMY WITH BILATERAL SALPINGO OOPHORECTOMY WITH SENTAL LYMPH NODE BIOPSY;  Surgeon: Nancy Marus, MD;  Location: [facility];  Service: Gynecology;  Laterality: Bilateral;  . TONSILLECTOMY    . TRANSTHORACIC ECHOCARDIOGRAM  04/2007   EF>55%; mild MR; mild-mod TR; mild pulm HTN; mild calcification of aortiv valve leaflets with mild valvular aortic stenosis  . TUBAL LIGATION      There were no vitals filed for this visit.   Subjective Assessment - 11/27/18 1603    Subjective  swelling in the left leg.  It comes and goes.  I had one compression stocking but it it made the swelling worse.    Pertinent History  Pt with history of locally advanced/recurrent endometrial carcinoma  with past radiation x 5 weeks and taxol/carboplatinum for 6 cycles.  Currently on pembrolizumab IV every 3 weeks/Zometa IV every 3 weeks. History of kidney stent placement 12/06/17, colostomy in 2019, total hysterectomy in 2017 with 5 lymph nodes removed. Recent PET scan showing severe bilateral sacral insufficiency fractures, Rt pubic symphysis fracture, healing 8-9-10th rib fractures, and metastatic lymph nodes. Last EF 65-70%, recent negative DVT scan for the LEs.  Reports good heart and kidneys. Osteoporosis    Limitations  --   reports none   How long can you walk comfortably?  I mainly walk slow with my back  pain    Patient Stated Goals  am I supposed to do something for my swelling    Currently in Pain?  Yes   never any leg pain   Pain Score  6     Pain Location  Back    Pain Descriptors / Indicators  Aching;Tightness;Sharp    Pain Onset  More than a month ago    Pain Frequency  Constant    Aggravating Factors   bending walking    Pain Relieving Factors  rest, steroid injections    Effect of Pain on Daily Activities  My back hurts me the worst         [facility] PT Assessment - 11/27/18 0001      Assessment   Medical Diagnosis  uterine carcinoma    Referring Provider (PT)  [name] NP    Onset Date/Surgical Date  07/30/18    Hand Dominance  Right    Prior Therapy  no      Precautions   Precaution Comments  lymphedema, osteoporosis, radiation to the abdomen, colostomy      Restrictions   Weight Bearing Restrictions  No      Balance Screen   Has the patient fallen in the past 6 months  No    Has the patient had a decrease in activity level because of a fear of falling?   No    Is the patient reluctant to leave their home because of a fear of falling?   No      Home Social worker  Private residence    Living Arrangements  Alone    Additional Comments  children visit me every day and sleep with me      Prior Function   Level of Independence  Independent with household mobility with device    Leisure  shopping, car rides      Cognition   Overall Cognitive Status  Within Functional Limits for tasks assessed      Observation/Other Assessments   Observations  Lt leg apparently larger, some increased/new abdominal swelling around colostomy that pt is also calling MD about    Skin Integrity  some redness/rash LEs and UEs pt calling MD about this from last infusions      Coordination   Gross Motor Movements are Fluid and Coordinated  Yes      Ambulation/Gait   Gait Comments  walks slowly, forward flexed trunk with SPC        LYMPHEDEMA/ONCOLOGY  QUESTIONNAIRE - 11/27/18 1616      Type   Cancer Type  uterine carcinoma      Surgeries   Other Surgery Date  --   total hysterctomy and SLNB 2017   Number Lymph Nodes Removed  5      Treatment   Active Chemotherapy Treatment  --   immunotherapy only  Active Radiation Treatment  No    Past Radiation Treatment  Yes      What other symptoms do you have   Are you Having Heaviness or Tightness  Yes    Are you having Pain  No    Are you having pitting edema  Yes   +3 pitting edema from top of the foot to 4" below the knee   Is it Hard or Difficult finding clothes that fit  Yes   shoes     Lymphedema Assessments   Lymphedema Assessments  Lower extremities      Right Lower Extremity Lymphedema   At Midpatella/Popliteal Crease  46.5 cm   in seated   30 cm Proximal to Floor at Lateral Plantar Foot  39.4 cm    20 cm Proximal to Floor at Lateral Plantar Foot  31.5 1    10  cm Proximal to Floor at Lateral Malleoli  25.2 cm    5 cm Proximal to 1st MTP Joint  20 cm    Across MTP Joint  20 cm    Around Proximal Great Toe  7.6 cm      Left Lower Extremity Lymphedema   At Midpatella/Popliteal Crease  50.5 cm   in seated   30 cm Proximal to Floor at Lateral Plantar Foot  41.5 cm    20 cm Proximal to Floor at Lateral Plantar Foot  37.5 cm    10 cm Proximal to Floor at Lateral Malleoli  30 cm    Circumference of ankle/heel  30 cm.    5 cm Proximal to 1st MTP Joint  22.2 cm    Across MTP Joint  21.7 cm    Around Proximal Great Toe  8 cm             Objective measurements completed on examination: See above findings.              PT Education - 11/27/18 2136    Education Details  POC, lymphedema causes and treatment    Person(s) Educated  Patient    Methods  Explanation    Comprehension  Verbalized understanding          PT Long Term Goals - 11/27/18 2145      PT LONG TERM GOAL #1   Title  Pt will reduce the Lt lower leg to allow for home use of  compression    Time  4    Period  Weeks    Status  New      PT LONG TERM GOAL #2   Title  Pt will be educated on lymphedema risk reduction practices    Time  4    Period  Weeks    Status  New      PT LONG TERM GOAL #3   Title  Pt will be able to get shoe on due to decreased foot size    Time  4    Period  Weeks    Status  New             Plan - 11/27/18 2139    Clinical Impression Statement  Pt presents to PT today with increased swelling in the Lt LE lower leg.  Has had intermittent swelling since surgery in 2017 but worse in the past 3 months.  Had DVT testing, good kidney function and good cardiovascular status.  Pt does report some increased abdominal swelling around the colostomy lately coinciding with the LE getting worse and is  going to mention this to her oncologist.  Pt will particiapte in CDT until she is able to get a compression garment.    Personal Factors and Comorbidities  Age;Time since onset of injury/illness/exacerbation;Comorbidity 2    Comorbidities  recurrent uterine carcinoma, radiation history    Examination-Participation Restrictions  Yard Work;Cleaning;Shop;Driving    Stability/Clinical Decision Making  Evolving/Moderate complexity   worsening of swelling   Clinical Decision Making  Moderate    Rehab Potential  Good    PT Frequency  3x / week    PT Duration  4 weeks    PT Treatment/Interventions  ADLs/Self Care Home Management;Manual lymph drainage;Manual techniques;Compression bandaging;Taping;Therapeutic exercise    PT Next Visit Plan  start CDT Lt LE, no abdominals/avoid colostomy, have pt monitor for increased abominal swelling, give risk reduction    Recommended Other Services  LE compression    Consulted and Agree with Plan of Care  Patient       Patient will benefit from skilled therapeutic intervention in order to improve the following deficits and impairments:  Decreased skin integrity, Increased edema  Visit Diagnosis: 1. Lymphedema,  not elsewhere classified        Problem List Patient Active Problem List   Diagnosis Date Noted  . Lower extremity edema 11/09/2018  . Back pain 11/09/2018  . IDA (iron deficiency anemia) 10/21/2017  . Abdominal mass   . Deep vein thrombosis (DVT) of left lower extremity ([place])   . Malignant neoplasm of uterus ([place])   . Pelvic mass   . Acute blood loss anemia   . 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 ([place]) 10/11/2017  . Goals of care, counseling/discussion 10/11/2017  . Rectal bleeding 10/03/2017  . Normocytic anemia 10/03/2017  . Left hip pain 10/03/2017  . Generalized weakness 10/03/2017  . Colostomy status ([place]) 08/30/2017  . Ureteral stent displacement, subsequent encounter 08/30/2017  . Atrial fibrillation with RVR ([place]) 08/30/2017  . Hypophosphatemia 08/30/2017  . Hypomagnesemia 08/30/2017  . Acute blood loss as cause of postoperative anemia 08/30/2017  . Chronic anemia 08/30/2017  . Left leg swelling 08/30/2017  . GERD (gastroesophageal reflux disease) 08/30/2017  . New onset atrial fibrillation ([place])   . Bowel perforation ([place]) 08/18/2017  . Colonic mass 08/18/2017  . Ureteral dilatation 08/18/2017  . Intractable right heel pain 10/07/2016  . Plantar fasciitis of right foot 10/07/2016  . Postoperative nausea and vomiting 01/02/2016  . Hyponatremia 01/01/2016  . Acute hypokalemia 01/01/2016  . Endometrial cancer ([place]) 12/30/2015  . Familial hypercholesteremia 07/28/2015  . Aortic valve stenosis 08/02/2014  . S/P CABG x 3 01/13/2013  . Palpitations 01/13/2013  . Medication intolerance 01/13/2013  . Chronic pain 01/13/2013  . TIA (transient ischemic attack) 01/13/2013  . Carotid stenosis 01/13/2013  . Hypothyroid 10/05/2010  . Essential hypertension 10/05/2010  . Gastroesophagitis 10/05/2010  . Atrophic gastritis 10/05/2010    Stark Bray 11/27/2018, 9:48 PM  [place], [place], [address] Phone: [phone]   Fax:  [phone]  Name: Shannon Scott MRN: [id number] Date of Birth: [birth year]

## 2018-11-28 ENCOUNTER — Telehealth: Payer: Self-pay | Admitting: Hematology & Oncology

## 2018-11-28 ENCOUNTER — Telehealth: Payer: Self-pay | Admitting: *Deleted

## 2018-11-28 NOTE — Telephone Encounter (Signed)
Patient states "I'm covered with all those red things again"  Rash is on arms and legs and is itchy. Her scalp is also itchy so she believes the rash may be there as well. The last time she had this she said the cream worked better than the oral steroids. She believes this occurrence is worse than the first time. She also complains of feelings of hot/cold flashes.   She had her Keytruda last week. She would like to hold one treatment to see if symptoms improve. She doesn't want anything additional for the rash, the OTC cream is enough, she just wants to hold treatment.   Reviewed with Dr Marin Olp. He would like patient's Keytruda be moved to every 6 weeks. Message sent to scheduling and pharmacy.Patient made aware of her treatment change.

## 2018-11-28 NOTE — Telephone Encounter (Signed)
Spoke with patient to inform her that the 7/14 appts has be cxled and she will follow up on 8/5 at 1130 am

## 2018-11-30 ENCOUNTER — Other Ambulatory Visit: Payer: Self-pay | Admitting: Family

## 2018-12-06 ENCOUNTER — Other Ambulatory Visit: Payer: Self-pay

## 2018-12-06 ENCOUNTER — Ambulatory Visit: Payer: Medicare Other | Attending: Gynecologic Oncology | Admitting: Rehabilitation

## 2018-12-06 ENCOUNTER — Encounter: Payer: Self-pay | Admitting: Rehabilitation

## 2018-12-06 DIAGNOSIS — I890 Lymphedema, not elsewhere classified: Secondary | ICD-10-CM | POA: Insufficient documentation

## 2018-12-06 NOTE — Patient Instructions (Signed)
PLEASE KEEP YOUR BANDAGES ON AS LONG AS POSSIBLE TO GET THE BEST SWELLING REDUCTION.  Should your bandages become uncomfortable or feel too tight, follow these steps: 1. Elevate your extremity higher than your heart.  2. Try to move your arm or leg joints against the firmness of the bandage to help with moving the fluid and allow the bandages to loosen a bit.  3. If the bandaging is still is too tight, it is ok to carefully remove the top layer.  There will still be more layers under it that can provide compression to your extremity. 4. Finally, if you STILL have significant pain after trying these steps, it is ok to take the bandage off.  Check your skin carefully for any signs of irritation  5. PLEASE bring ALL bandage materials back to your next appointment as we will reuse what we can  TAKE CARE OF YOUR BANDAGES SO THEY WILL LAST LONGER AND STAY IN BETTER CONDITION Washing bandages:  Wash periodically using a mild detergent in warm water.  Do not use fabric softener or bleach.  Place bandages in a mesh lingerie bag or in a tied off pillow case and use the gentle cycle of the washing machine or hand wash. If you hand wash, you may want to put them in the spin cycle of your washer to get the extra water out, but make sure you put them in a mesh bag first. Do not wring or stretch them while they are wet.  Drying bandages: Lay the bandages out smoothly on a towel away from direct sunlight or heating sources that can damage the fabric. Rolling bandages in a towel and gently squeezing the towel to remove excess water before laying them out can speed up the process.  If you use a drying rack, place a towel on top of the rack to lay the bandages on.  If they hang down to dry, they fabric could be stretched out and the bandage will lose its compression.   Or, keep bandages in the mesh bag and dry them in the dryer on the low or no heat cycle. Rolling bandages: Please roll your bandages after drying them so  they are ready for your next treatment. If they are rolled too loose, they will be difficult to apply.  If rolled too tight, they can get stretched out.    TAKE CARE OF YOUR SKIN 1. Apply a low pH moisturizing lotion to your skin daily 2. Avoid scratching your skin 3. Treat skin irritations quickly  4. Know the 5 warning signs of infection: redness, pain, warmth to touch, fever and increased swelling.  Call your physician immediately if you notice any of these signs of a possible infection.  Watch for increased blood pressure or shortness of breath and remove bandages if necessary.

## 2018-12-06 NOTE — Therapy (Signed)
[place], [place], [address] Phone: [phone]   Fax:  [phone]  Physical Therapy Treatment  Patient Details  Name: Shannon Scott MRN: [id number] Date of Birth: 22-Aug-1940 Referring Provider (PT): [name] NP   Encounter Date: 12/06/2018  PT End of Session - 12/06/18 1645    Visit Number  2    Number of Visits  12    Date for PT Re-Evaluation  12/25/18    Authorization Type  [employer] medicare    PT Start Time  1600    PT Stop Time  8563    PT Time Calculation (min)  39 min    Activity Tolerance  Patient tolerated treatment well    Behavior During Therapy  [facility] for tasks assessed/performed       Past Medical History:  Diagnosis Date  . Arthritis   . Asthma    allergy induced  . Bilateral carotid artery disease (HCC)    L carotid bruit  . Bursitis    left hip  . CAD (coronary artery disease)   . Cancer ([place])   . Dyslipidemia    intolerant to statins, welchol, niacin, zetia  . Goals of care, counseling/discussion 10/11/2017  . History of blood transfusion   . History of nuclear stress test 04/24/2012   lexiscan; normal study  . Hypertension   . Hypothyroidism   . 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 ([place]) 10/11/2017  . Postoperative nausea and vomiting 01/02/2016    Past Surgical History:  Procedure Laterality Date  . ABDOMINAL HYSTERECTOMY    . Carotid Doppler  02/2012   40-59% right int carotid artery stenosis; 60-79% L int carotid stenosis; L carotid bruit  . CORONARY ARTERY BYPASS GRAFT  03/12/2004   LIMA to LAD, SVG to circumflex, SVG to PDA  . CYSTOSCOPY W/ URETERAL STENT PLACEMENT Left 12/06/2017   Procedure: CYSTOSCOPY WITH LEFT URETERAL STENT EXCHANGE;  Surgeon: Irine Seal, MD;  Location: [facility];  Service: Urology;  Laterality: Left;  . CYSTOSCOPY WITH STENT PLACEMENT Bilateral 08/21/2017   Procedure: CYSTOSCOPY WITH STENT  PLACEMENT;  Surgeon: Ceasar Mons, MD;  Location: [facility];  Service: Urology;  Laterality: Bilateral;  . IR FLUORO GUIDE PORT INSERTION RIGHT  10/11/2017  . IR GENERIC HISTORICAL  04/29/2016   IR RADIOLOGIST EVAL & MGMT 04/29/2016 Sandi Mariscal, MD [facility] INTERV RAD  . IR GENERIC HISTORICAL  05/12/2016   IR RADIOLOGIST EVAL & MGMT 05/12/2016 Sandi Mariscal, MD [facility] INTER[facility] RAD  . IR IVC FILTER PLMT / S&I /IMG GUID/MOD SED  10/11/2017  . IR US GUIDE VASC ACCESS RIGHT  10/11/2017  . ROBOTIC ASSISTED TOTAL HYSTERECTOMY WITH BILATERAL SALPINGO OOPHERECTOMY Bilateral 12/30/2015   Procedure: XI ROBOTIC ASSISTED TOTAL HYSTERECTOMY WITH BILATERAL SALPINGO OOPHORECTOMY WITH SENTAL LYMPH NODE BIOPSY;  Surgeon: Nancy Marus, MD;  Location: [facility];  Service: Gynecology;  Laterality: Bilateral;  . TONSILLECTOMY    . TRANSTHORACIC ECHOCARDIOGRAM  04/2007   EF>55%; mild MR; mild-mod TR; mild pulm HTN; mild calcification of aortiv valve leaflets with mild valvular aortic stenosis  . TUBAL LIGATION      There were no vitals filed for this visit.  Subjective Assessment - 12/06/18 1559    Subjective  I talked to Dr. Marin Olp about the rash and they are going to space my appointments every 6 weeks instead of 3 weeks.    Pertinent History  Pt with history of locally advanced/recurrent endometrial carcinoma with  past radiation x 5 weeks and taxol/carboplatinum for 6 cycles.  Currently on pembrolizumab IV every 3 weeks/Zometa IV every 3 weeks. History of kidney stent placement 12/06/17, colostomy in 2019, total hysterectomy in 2017 with 5 lymph nodes removed. Recent PET scan showing severe bilateral sacral insufficiency fractures, Rt pubic symphysis fracture, healing 8-9-10th rib fractures, and metastatic lymph nodes. Last EF 65-70%, recent negative DVT scan for the LEs.  Reports good heart and kidneys. Osteoporosis    Currently in Pain?  Yes    Pain Score  5     Pain Location  Back    Pain Orientation  Left    Pain  Descriptors / Indicators  Aching    Pain Onset  More than a month ago    Pain Frequency  Constant            LYMPHEDEMA/ONCOLOGY QUESTIONNAIRE - 12/06/18 1602      Left Lower Extremity Lymphedema   30 cm Proximal to Floor at Lateral Plantar Foot  42.5 cm    20 cm Proximal to Floor at Lateral Plantar Foot  37.2 cm    10 cm Proximal to Floor at Lateral Malleoli  30 cm    5 cm Proximal to 1st MTP Joint  21.4 cm    Across MTP Joint  21.7 cm    Around Proximal Great Toe  8 cm                [facility] Adult PT Treatment/Exercise - 12/06/18 0001      Manual Therapy   Manual Therapy  Manual Lymphatic Drainage (MLD);Edema management;Compression Bandaging    Manual therapy comments  education on bandaging, signs and symptoms to remove bandages and how to care for any pain that arises.      Edema Management  remeasured LEs due to time since last visit; no sig changes    Manual Lymphatic Drainage (MLD)  supine HOB elevated MLD to the Lt LE, no abdominals due to colostomy, Lt axillary nodes, Lt inguinal nodes, Lt inguinal axillary anastamosis then work on the thigh, knee, lower leg, and dorsum of the foot working back proximally.      Compression Bandaging  performed in supine due to back pain seated; thin stockinette, toes 1-4, artiflex with more padding around the ankle, 1-6cm roman sandal bandage, 1-8cm, and 1-8cm bandagdes foot to knee             PT Education - 12/06/18 1645    Education Details  bandaging safety, care of bandages, MLD    Person(s) Educated  Patient    Methods  Explanation;Handout    Comprehension  Verbalized understanding          PT Long Term Goals - 11/27/18 2145      PT LONG TERM GOAL #1   Title  Pt will reduce the Lt lower leg to allow for home use of compression    Time  4    Period  Weeks    Status  New      PT LONG TERM GOAL #2   Title  Pt will be educated on lymphedema risk reduction practices    Time  4    Period  Weeks    Status   New      PT LONG TERM GOAL #3   Title  Pt will be able to get shoe on due to decreased foot size    Time  4    Period  Weeks  Status  New            Plan - 12/06/18 1645    Clinical Impression Statement  Started MLD and bandaging today with bandaging only to the knee and with less overall compression for testing of tolerance.  Pt knowledgeable on bandaging self care and what to look for to remove bandages.  Reports bandage comfort upon leaving.  May reduce quickly due to mild swelling and may be ready for Melissa to measure on the 20th OR possibly exostrong due to cost/insurance    PT Treatment/Interventions  ADLs/Self Care Home Management;Manual lymph drainage;Manual techniques;Compression bandaging;Taping;Therapeutic exercise    PT Next Visit Plan  cont CDT Lt LE most likely knee high only but possibly thigh high needed, no abdominals/avoid colostomy, have pt monitor for increased abominal swelling,    Recommended Other Services  melissa measure vs exostrong due to cost/insurance; knee high vs thigh high?    Consulted and Agree with Plan of Care  Patient       Patient will benefit from skilled therapeutic intervention in order to improve the following deficits and impairments:     Visit Diagnosis: 1. Lymphedema, not elsewhere classified        Problem List Patient Active Problem List   Diagnosis Date Noted  . Lower extremity edema 11/09/2018  . Back pain 11/09/2018  . IDA (iron deficiency anemia) 10/21/2017  . Abdominal mass   . Deep vein thrombosis (DVT) of left lower extremity ([place])   . Malignant neoplasm of uterus ([place])   . Pelvic mass   . Acute blood loss anemia   . 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 ([place]) 10/11/2017  . Goals of care, counseling/discussion 10/11/2017  . Rectal bleeding 10/03/2017  . Normocytic anemia 10/03/2017  . Left hip pain 10/03/2017  . Generalized weakness 10/03/2017  . Colostomy status ([place])  08/30/2017  . Ureteral stent displacement, subsequent encounter 08/30/2017  . Atrial fibrillation with RVR ([place]) 08/30/2017  . Hypophosphatemia 08/30/2017  . Hypomagnesemia 08/30/2017  . Acute blood loss as cause of postoperative anemia 08/30/2017  . Chronic anemia 08/30/2017  . Left leg swelling 08/30/2017  . GERD (gastroesophageal reflux disease) 08/30/2017  . New onset atrial fibrillation ([place])   . Bowel perforation ([place]) 08/18/2017  . Colonic mass 08/18/2017  . Ureteral dilatation 08/18/2017  . Intractable right heel pain 10/07/2016  . Plantar fasciitis of right foot 10/07/2016  . Postoperative nausea and vomiting 01/02/2016  . Hyponatremia 01/01/2016  . Acute hypokalemia 01/01/2016  . Endometrial cancer ([place]) 12/30/2015  . Familial hypercholesteremia 07/28/2015  . Aortic valve stenosis 08/02/2014  . S/P CABG x 3 01/13/2013  . Palpitations 01/13/2013  . Medication intolerance 01/13/2013  . Chronic pain 01/13/2013  . TIA (transient ischemic attack) 01/13/2013  . Carotid stenosis 01/13/2013  . Hypothyroid 10/05/2010  . Essential hypertension 10/05/2010  . Gastroesophagitis 10/05/2010  . Atrophic gastritis 10/05/2010    Stark Bray 12/06/2018, 4:49 PM  [place], [place], [address] Phone: [phone]   Fax:  [phone]  Name: Shannon Scott MRN: [id number] Date of Birth: Nov 16, 1940

## 2018-12-08 ENCOUNTER — Ambulatory Visit: Payer: Medicare Other | Admitting: Physical Therapy

## 2018-12-08 ENCOUNTER — Other Ambulatory Visit: Payer: Self-pay

## 2018-12-08 ENCOUNTER — Encounter: Payer: Self-pay | Admitting: Physical Therapy

## 2018-12-08 DIAGNOSIS — I890 Lymphedema, not elsewhere classified: Secondary | ICD-10-CM

## 2018-12-08 NOTE — Therapy (Signed)
[place], [place], [address] Phone: [phone]   Fax:  [phone]  Physical Therapy Treatment  Patient Details  Name: Shannon Scott MRN: [id number] Date of Birth: 1941-05-14 Referring Provider (PT): [name] NP   Encounter Date: 12/08/2018  PT End of Session - 12/08/18 1225    Visit Number  3    Number of Visits  12    Date for PT Re-Evaluation  12/25/18    PT Start Time  1133    PT Stop Time  1220    PT Time Calculation (min)  47 min    Activity Tolerance  Patient tolerated treatment well    Behavior During Therapy  [facility] for tasks assessed/performed       Past Medical History:  Diagnosis Date  . Arthritis   . Asthma    allergy induced  . Bilateral carotid artery disease (HCC)    L carotid bruit  . Bursitis    left hip  . CAD (coronary artery disease)   . Cancer ([place])   . Dyslipidemia    intolerant to statins, welchol, niacin, zetia  . Goals of care, counseling/discussion 10/11/2017  . History of blood transfusion   . History of nuclear stress test 04/24/2012   lexiscan; normal study  . Hypertension   . Hypothyroidism   . 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 ([place]) 10/11/2017  . Postoperative nausea and vomiting 01/02/2016    Past Surgical History:  Procedure Laterality Date  . ABDOMINAL HYSTERECTOMY    . Carotid Doppler  02/2012   40-59% right int carotid artery stenosis; 60-79% L int carotid stenosis; L carotid bruit  . CORONARY ARTERY BYPASS GRAFT  03/12/2004   LIMA to LAD, SVG to circumflex, SVG to PDA  . CYSTOSCOPY W/ URETERAL STENT PLACEMENT Left 12/06/2017   Procedure: CYSTOSCOPY WITH LEFT URETERAL STENT EXCHANGE;  Surgeon: Irine Seal, MD;  [facility];  Service: Urology;  Laterality: Left;  . CYSTOSCOPY WITH STENT PLACEMENT Bilateral 08/21/2017   Procedure: CYSTOSCOPY WITH STENT PLACEMENT;  Surgeon: Ceasar Mons, MD;  [facility];  Service: Urology;  Laterality: Bilateral;  . IR FLUORO GUIDE PORT INSERTION RIGHT  10/11/2017  . IR GENERIC HISTORICAL  04/29/2016   IR RADIOLOGIST EVAL & MGMT 04/29/2016 Sandi Mariscal, MD [facility] INTERV RAD  . IR GENERIC HISTORICAL  05/12/2016   IR RADIOLOGIST EVAL & MGMT 05/12/2016 Sandi Mariscal, MD [facility] INTERV RAD  . IR IVC FILTER PLMT / S&I /IMG GUID/MOD SED  10/11/2017  . IR US GUIDE VASC ACCESS RIGHT  10/11/2017  . ROBOTIC ASSISTED TOTAL HYSTERECTOMY WITH BILATERAL SALPINGO OOPHERECTOMY Bilateral 12/30/2015   Procedure: XI ROBOTIC ASSISTED TOTAL HYSTERECTOMY WITH BILATERAL SALPINGO OOPHORECTOMY WITH SENTAL LYMPH NODE BIOPSY;  Surgeon: Nancy Marus, MD;  Location[facility];  Service: Gynecology;  Laterality: Bilateral;  . TONSILLECTOMY    . TRANSTHORACIC ECHOCARDIOGRAM  04/2007   EF>55%; mild MR; mild-mod TR; mild pulm HTN; mild calcification of aortiv valve leaflets with mild valvular aortic stenosis  . TUBAL LIGATION      There were no vitals filed for this visit.  Subjective Assessment - 12/08/18 1134    Subjective  Around the ankle was a little snug. They did not do too well. The top slid down and the bottom came off and I just had to pull it off the rest of the way.    Pertinent History  Pt with history of locally  advanced/recurrent endometrial carcinoma with past radiation x 5 weeks and taxol/carboplatinum for 6 cycles.  Currently on pembrolizumab IV every 3 weeks/Zometa IV every 3 weeks. History of kidney stent placement 12/06/17, colostomy in 2019, total hysterectomy in 2017 with 5 lymph nodes removed. Recent PET scan showing severe bilateral sacral insufficiency fractures, Rt pubic symphysis fracture, healing 8-9-10th rib fractures, and metastatic lymph nodes. Last EF 65-70%, recent negative DVT scan for the LEs.  Reports good heart and kidneys. Osteoporosis    Patient Stated Goals  am I supposed to do something for my swelling    Currently in Pain?  Yes    Pain  Score  5     Pain Location  Back    Pain Orientation  Left    Pain Descriptors / Indicators  Aching                       [facility] Adult PT Treatment/Exercise - 12/08/18 0001      Manual Therapy   Manual Lymphatic Drainage (MLD)  supine HOB elevated MLD to the Lt LE, no abdominals due to colostomy, Lt axillary nodes, Lt inguinal nodes, Lt inguinal axillary anastamosis then work on the thigh, knee, lower leg, and dorsum of the foot working back proximally.      Compression Bandaging  leg washed in soap and water prior to bandaging: performed in supine due to back pain seated; thin stockinette, toes 1-4, artiflex with more padding around the ankle, 1-6cm roman sandal bandage, 1-8cm, and 1-8cm bandagdes foot to knee                  PT Long Term Goals - 11/27/18 2145      PT LONG TERM GOAL #1   Title  Pt will reduce the Lt lower leg to allow for home use of compression    Time  4    Period  Weeks    Status  New      PT LONG TERM GOAL #2   Title  Pt will be educated on lymphedema risk reduction practices    Time  4    Period  Weeks    Status  New      PT LONG TERM GOAL #3   Title  Pt will be able to get shoe on due to decreased foot size    Time  4    Period  Weeks    Status  New            Plan - 12/08/18 1225    Clinical Impression Statement  Continued with MLD and bandaging today to the knee. Pt tolerated them well last time and had no increase in abdominal swelling. Applied slightly more taut today since pt reports they slid down. Educated pt to wear shoe cover over her foot at all times to keep toe bandages from coming loose.    Stability/Clinical Decision Making  Evolving/Moderate complexity    Rehab Potential  Good    PT Frequency  3x / week    PT Duration  4 weeks    PT Treatment/Interventions  ADLs/Self Care Home Management;Manual lymph drainage;Manual techniques;Compression bandaging;Taping;Therapeutic exercise    PT Next Visit Plan  cont  CDT Lt LE most likely knee high only but possibly thigh high needed, no abdominals/avoid colostomy, have pt monitor for increased abominal swelling,    Consulted and Agree with Plan of Care  Patient       Patient will benefit from  skilled therapeutic intervention in order to improve the following deficits and impairments:  Decreased skin integrity, Increased edema  Visit Diagnosis: 1. Lymphedema, not elsewhere classified        Problem List Patient Active Problem List   Diagnosis Date Noted  . Lower extremity edema 11/09/2018  . Back pain 11/09/2018  . IDA (iron deficiency anemia) 10/21/2017  . Abdominal mass   . Deep vein thrombosis (DVT) of left lower extremity ([place])   . Malignant neoplasm of uterus ([place])   . Pelvic mass   . Acute blood loss anemia   . 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 ([place]) 10/11/2017  . Goals of care, counseling/discussion 10/11/2017  . Rectal bleeding 10/03/2017  . Normocytic anemia 10/03/2017  . Left hip pain 10/03/2017  . Generalized weakness 10/03/2017  . Colostomy status ([place]) 08/30/2017  . Ureteral stent displacement, subsequent encounter 08/30/2017  . Atrial fibrillation with RVR ([place]) 08/30/2017  . Hypophosphatemia 08/30/2017  . Hypomagnesemia 08/30/2017  . Acute blood loss as cause of postoperative anemia 08/30/2017  . Chronic anemia 08/30/2017  . Left leg swelling 08/30/2017  . GERD (gastroesophageal reflux disease) 08/30/2017  . New onset atrial fibrillation ([place])   . Bowel perforation ([place]) 08/18/2017  . Colonic mass 08/18/2017  . Ureteral dilatation 08/18/2017  . Intractable right heel pain 10/07/2016  . Plantar fasciitis of right foot 10/07/2016  . Postoperative nausea and vomiting 01/02/2016  . Hyponatremia 01/01/2016  . Acute hypokalemia 01/01/2016  . Endometrial cancer ([place]) 12/30/2015  . Familial hypercholesteremia 07/28/2015  . Aortic valve stenosis 08/02/2014  . S/P CABG x 3  01/13/2013  . Palpitations 01/13/2013  . Medication intolerance 01/13/2013  . Chronic pain 01/13/2013  . TIA (transient ischemic attack) 01/13/2013  . Carotid stenosis 01/13/2013  . Hypothyroid 10/05/2010  . Essential hypertension 10/05/2010  . Gastroesophagitis 10/05/2010  . Atrophic gastritis 10/05/2010    Allyson Sabal [facility] 12/08/2018, 12:28 PM  [place], [place], [address] Phone: [phone]   Fax:  [phone]  Name: SARA KEYS MRN: [id number] Date of Birth: 1941-02-14  Manus Gunning, PT 12/08/18 12:29 PM

## 2018-12-11 ENCOUNTER — Other Ambulatory Visit: Payer: Self-pay

## 2018-12-11 ENCOUNTER — Ambulatory Visit: Payer: Medicare Other

## 2018-12-11 DIAGNOSIS — I890 Lymphedema, not elsewhere classified: Secondary | ICD-10-CM | POA: Diagnosis not present

## 2018-12-11 NOTE — Therapy (Signed)
[place], [place], [address] Phone: [phone]   Fax:  [phone]  Physical Therapy Treatment  Patient Details  Name: Shannon Scott MRN: [id number] Date of Birth: [birth year] Referring Provider (PT): [name] NP   Encounter Date: 12/11/2018  PT End of Session - 12/11/18 1052    Visit Number  4    Number of Visits  12    Date for PT Re-Evaluation  12/25/18    PT Start Time  1287    PT Stop Time  1049    PT Time Calculation (min)  47 min    Activity Tolerance  Patient tolerated treatment well    Behavior During Therapy  [facility] for tasks assessed/performed       Past Medical History:  Diagnosis Date  . Arthritis   . Asthma    allergy induced  . Bilateral carotid artery disease (HCC)    L carotid bruit  . Bursitis    left hip  . CAD (coronary artery disease)   . Cancer ([place])   . Dyslipidemia    intolerant to statins, welchol, niacin, zetia  . Goals of care, counseling/discussion 10/11/2017  . History of blood transfusion   . History of nuclear stress test 04/24/2012   lexiscan; normal study  . Hypertension   . Hypothyroidism   . 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 ([place]) 10/11/2017  . Postoperative nausea and vomiting 01/02/2016    Past Surgical History:  Procedure Laterality Date  . ABDOMINAL HYSTERECTOMY    . Carotid Doppler  02/2012   40-59% right int carotid artery stenosis; 60-79% L int carotid stenosis; L carotid bruit  . CORONARY ARTERY BYPASS GRAFT  03/12/2004   LIMA to LAD, SVG to circumflex, SVG to PDA  . CYSTOSCOPY W/ URETERAL STENT PLACEMENT Left 12/06/2017   Procedure: CYSTOSCOPY WITH LEFT URETERAL STENT EXCHANGE;  Surgeon: Irine Seal, MD;  Location: [facility];  Service: Urology;  Laterality: Left;  . CYSTOSCOPY WITH STENT PLACEMENT Bilateral 08/21/2017   Procedure: CYSTOSCOPY WITH STENT PLACEMENT;  Surgeon: Ceasar Mons, MD;  Location: [facility];  Service: Urology;  Laterality: Bilateral;  . IR FLUORO GUIDE PORT INSERTION RIGHT  10/11/2017  . IR GENERIC HISTORICAL  04/29/2016   IR RADIOLOGIST EVAL & MGMT 04/29/2016 Sandi Mariscal, MD [facility] INTERV RAD  . IR GENERIC HISTORICAL  05/12/2016   IR RADIOLOGIST EVAL & MGMT 05/12/2016 Sandi Mariscal, MD [facility] INTER[facility] RAD  . IR IVC FILTER PLMT / S&I /IMG GUID/MOD SED  10/11/2017  . IR US GUIDE VASC ACCESS RIGHT  10/11/2017  . ROBOTIC ASSISTED TOTAL HYSTERECTOMY WITH BILATERAL SALPINGO OOPHERECTOMY Bilateral 12/30/2015   Procedure: XI ROBOTIC ASSISTED TOTAL HYSTERECTOMY WITH BILATERAL SALPINGO OOPHORECTOMY WITH SENTAL LYMPH NODE BIOPSY;  Surgeon: Nancy Marus, MD;  Location: [facility];  Service: Gynecology;  Laterality: Bilateral;  . TONSILLECTOMY    . TRANSTHORACIC ECHOCARDIOGRAM  04/2007   EF>55%; mild MR; mild-mod TR; mild pulm HTN; mild calcification of aortiv valve leaflets with mild valvular aortic stenosis  . TUBAL LIGATION      There were no vitals filed for this visit.  Subjective Assessment - 12/11/18 1005    Subjective  The bandages felt tight over the top pf my foot by Saturday morning so I had my granddaughter rewrap it for me and they've been on since then. I'm out of my tramidol so my back is hurting more today.    Pertinent History  Pt with history of locally advanced/recurrent endometrial carcinoma with past radiation x 5 weeks and taxol/carboplatinum for 6 cycles.  Currently on pembrolizumab IV every 3 weeks/Zometa IV every 3 weeks. History of kidney stent placement 12/06/17, colostomy in 2019, total hysterectomy in 2017 with 5 lymph nodes removed. Recent PET scan showing severe bilateral sacral insufficiency fractures, Rt pubic symphysis fracture, healing 8-9-10th rib fractures, and metastatic lymph nodes. Last EF 65-70%, recent negative DVT scan for the LEs.  Reports good heart and kidneys. Osteoporosis    Patient Stated Goals  am I supposed to do something for my  swelling    Currently in Pain?  Yes    Pain Score  7     Pain Location  Buttocks    Pain Orientation  Right;Left    Pain Descriptors / Indicators  Aching;Constant    Pain Type  Chronic pain    Pain Onset  More than a month ago    Pain Frequency  Constant    Aggravating Factors   overdoing it    Pain Relieving Factors  pain meds            LYMPHEDEMA/ONCOLOGY QUESTIONNAIRE - 12/11/18 1010      Left Lower Extremity Lymphedema   At Midpatella/Popliteal Crease  43.8 cm   in sitting   30 cm Proximal to Floor at Lateral Plantar Foot  36.2 cm    20 cm Proximal to Floor at Lateral Plantar Foot  30.7 cm    10 cm Proximal to Floor at Lateral Malleoli  25.4 cm    5 cm Proximal to 1st MTP Joint  21.4 cm    Across MTP Joint  19.8 cm    Around Proximal Great Toe  7.4 cm                [facility] Adult PT Treatment/Exercise - 12/11/18 0001      Manual Therapy   Edema Management  Removed bandages and washed pts leg with soap and water while assessing skin.    Manual Lymphatic Drainage (MLD)  supine HOB elevated MLD to the Lt LE, no abdominals due to colostomy, Lt axillary nodes, Lt inguinal nodes, Lt inguinal axillary anastamosis then work on the thigh, knee, lower leg, and dorsum of the foot working back proximally.      Compression Bandaging  Performed in supine due to back pain seated; thick massage cream from foot to knee, thin stockinette, Elastomull (did not use full roll) to toes 1-3 with 1/4" gray foam over dorsal foot to alleviate c/o tightness from bandages, artiflex x1, 1-6cm combining roman sandal and ASH bandaging for decreased tension, 1-8cm in herring bone fashion, and 1-8cm bandagdes both foot to knee                  PT Long Term Goals - 11/27/18 2145      PT LONG TERM GOAL #1   Title  Pt will reduce the Lt lower leg to allow for home use of compression    Time  4    Period  Weeks    Status  New      PT LONG TERM GOAL #2   Title  Pt will be educated  on lymphedema risk reduction practices    Time  4    Period  Weeks    Status  New      PT LONG TERM GOAL #3   Title  Pt will be able to get shoe on due to  decreased foot size    Time  4    Period  Weeks    Status  New            Plan - 12/11/18 1052    Clinical Impression Statement  Pts circumference measurements have greatly reduced since last time measured. She is also tolerating bandages well and her skin, other than dry, is doing well also. Pt is agreeable to continuing bandaging thru this week and being measured by our fitter, Melissa, on Monday, 12/18/18. Added compression to dorsum of foot as she had c/o bandaging on foot being too tight last time.    Personal Factors and Comorbidities  Age;Time since onset of injury/illness/exacerbation;Comorbidity 2    Comorbidities  recurrent uterine carcinoma, radiation history    Examination-Participation Restrictions  Yard Work;Cleaning;Shop;Driving    Stability/Clinical Decision Making  Evolving/Moderate complexity    Rehab Potential  Good    PT Duration  4 weeks    PT Treatment/Interventions  ADLs/Self Care Home Management;Manual lymph drainage;Manual techniques;Compression bandaging;Taping;Therapeutic exercise    PT Next Visit Plan  cont CDT Lt LE most likely knee high only but possibly thigh high needed, no abdominals/avoid colostomy, have pt monitor for increased abominal swelling; plan to be measured by fitter 12/18/18, maybe for velcro compression depending on what pt can get on.    Consulted and Agree with Plan of Care  Patient       Patient will benefit from skilled therapeutic intervention in order to improve the following deficits and impairments:  Decreased skin integrity, Increased edema  Visit Diagnosis: 1. Lymphedema, not elsewhere classified        Problem List Patient Active Problem List   Diagnosis Date Noted  . Lower extremity edema 11/09/2018  . Back pain 11/09/2018  . IDA (iron deficiency anemia)  10/21/2017  . Abdominal mass   . Deep vein thrombosis (DVT) of left lower extremity ([place])   . Malignant neoplasm of uterus ([place])   . Pelvic mass   . Acute blood loss anemia   . 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 ([place]) 10/11/2017  . Goals of care, counseling/discussion 10/11/2017  . Rectal bleeding 10/03/2017  . Normocytic anemia 10/03/2017  . Left hip pain 10/03/2017  . Generalized weakness 10/03/2017  . Colostomy status ([place]) 08/30/2017  . Ureteral stent displacement, subsequent encounter 08/30/2017  . Atrial fibrillation with RVR ([place]) 08/30/2017  . Hypophosphatemia 08/30/2017  . Hypomagnesemia 08/30/2017  . Acute blood loss as cause of postoperative anemia 08/30/2017  . Chronic anemia 08/30/2017  . Left leg swelling 08/30/2017  . GERD (gastroesophageal reflux disease) 08/30/2017  . New onset atrial fibrillation ([place])   . Bowel perforation ([place]) 08/18/2017  . Colonic mass 08/18/2017  . Ureteral dilatation 08/18/2017  . Intractable right heel pain 10/07/2016  . Plantar fasciitis of right foot 10/07/2016  . Postoperative nausea and vomiting 01/02/2016  . Hyponatremia 01/01/2016  . Acute hypokalemia 01/01/2016  . Endometrial cancer ([place]) 12/30/2015  . Familial hypercholesteremia 07/28/2015  . Aortic valve stenosis 08/02/2014  . S/P CABG x 3 01/13/2013  . Palpitations 01/13/2013  . Medication intolerance 01/13/2013  . Chronic pain 01/13/2013  . TIA (transient ischemic attack) 01/13/2013  . Carotid stenosis 01/13/2013  . Hypothyroid 10/05/2010  . Essential hypertension 10/05/2010  . Gastroesophagitis 10/05/2010  . Atrophic gastritis 10/05/2010    Shannon Scott, PTA 12/11/2018, 10:56 AM  [place], [place], [address] Phone: [phone]  Fax:  [phone]  Name: Shannon Scott MRN: [id number] Date of Birth: Jun 20, 1940

## 2018-12-12 ENCOUNTER — Ambulatory Visit: Payer: Medicare Other | Admitting: Family

## 2018-12-12 ENCOUNTER — Other Ambulatory Visit: Payer: Medicare Other

## 2018-12-12 ENCOUNTER — Ambulatory Visit: Payer: Medicare Other

## 2018-12-13 ENCOUNTER — Ambulatory Visit: Payer: Medicare Other | Admitting: Rehabilitation

## 2018-12-13 ENCOUNTER — Encounter: Payer: Self-pay | Admitting: Rehabilitation

## 2018-12-13 ENCOUNTER — Other Ambulatory Visit: Payer: Self-pay

## 2018-12-13 DIAGNOSIS — I890 Lymphedema, not elsewhere classified: Secondary | ICD-10-CM | POA: Diagnosis not present

## 2018-12-13 NOTE — Therapy (Signed)
[place], [place], [address] Phone: [phone]   Fax:  [phone]  Physical Therapy Treatment  Patient Details  Name: Shannon Scott MRN: [id number] Date of Birth: [birth year] Referring Provider (PT): [name] NP   Encounter Date: 12/13/2018  PT End of Session - 12/13/18 1449    Visit Number  5    Number of Visits  12    Date for PT Re-Evaluation  12/25/18    PT Start Time  1400    PT Stop Time  1440    PT Time Calculation (min)  40 min    Activity Tolerance  Patient tolerated treatment well    Behavior During Therapy  [facility] for tasks assessed/performed       Past Medical History:  Diagnosis Date  . Arthritis   . Asthma    allergy induced  . Bilateral carotid artery disease (HCC)    L carotid bruit  . Bursitis    left hip  . CAD (coronary artery disease)   . Cancer ([place])   . Dyslipidemia    intolerant to statins, welchol, niacin, zetia  . Goals of care, counseling/discussion 10/11/2017  . History of blood transfusion   . History of nuclear stress test 04/24/2012   lexiscan; normal study  . Hypertension   . Hypothyroidism   . 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 ([place]) 10/11/2017  . Postoperative nausea and vomiting 01/02/2016    Past Surgical History:  Procedure Laterality Date  . ABDOMINAL HYSTERECTOMY    . Carotid Doppler  02/2012   40-59% right int carotid artery stenosis; 60-79% L int carotid stenosis; L carotid bruit  . CORONARY ARTERY BYPASS GRAFT  03/12/2004   LIMA to LAD, SVG to circumflex, SVG to PDA  . CYSTOSCOPY W/ URETERAL STENT PLACEMENT Left 12/06/2017   Procedure: CYSTOSCOPY WITH LEFT URETERAL STENT EXCHANGE;  Surgeon: Irine Seal, MD;  [facility];  Service: Urology;  Laterality: Left;  . CYSTOSCOPY WITH STENT PLACEMENT Bilateral 08/21/2017   Procedure: CYSTOSCOPY WITH STENT PLACEMENT;  Surgeon: Ceasar Mons, MD;  [facility];  Service: Urology;  Laterality: Bilateral;  . IR FLUORO GUIDE PORT INSERTION RIGHT  10/11/2017  . IR GENERIC HISTORICAL  04/29/2016   IR RADIOLOGIST EVAL & MGMT 04/29/2016 Sandi Mariscal, MD [facility] INTERV RAD  . IR GENERIC HISTORICAL  05/12/2016   IR RADIOLOGIST EVAL & MGMT 05/12/2016 Sandi Mariscal, MD [facility] INTERV RAD  . IR IVC FILTER PLMT / S&I /IMG GUID/MOD SED  10/11/2017  . IR US GUIDE VASC ACCESS RIGHT  10/11/2017  . ROBOTIC ASSISTED TOTAL HYSTERECTOMY WITH BILATERAL SALPINGO OOPHERECTOMY Bilateral 12/30/2015   Procedure: XI ROBOTIC ASSISTED TOTAL HYSTERECTOMY WITH BILATERAL SALPINGO OOPHORECTOMY WITH SENTAL LYMPH NODE BIOPSY;  Surgeon: Nancy Marus, MD;  [facility];  Service: Gynecology;  Laterality: Bilateral;  . TONSILLECTOMY    . TRANSTHORACIC ECHOCARDIOGRAM  04/2007   EF>55%; mild MR; mild-mod TR; mild pulm HTN; mild calcification of aortiv valve leaflets with mild valvular aortic stenosis  . TUBAL LIGATION      There were no vitals filed for this visit.  Subjective Assessment - 12/13/18 1357    Subjective  They did not fall off this time    Pertinent History  Pt with history of locally advanced/recurrent endometrial carcinoma with past radiation x 5 weeks and taxol/carboplatinum for 6 cycles.  Currently on pembrolizumab IV every 3 weeks/Zometa IV every 3 weeks. History of  kidney stent placement 12/06/17, colostomy in 2019, total hysterectomy in 2017 with 5 lymph nodes removed. Recent PET scan showing severe bilateral sacral insufficiency fractures, Rt pubic symphysis fracture, healing 8-9-10th rib fractures, and metastatic lymph nodes. Last EF 65-70%, recent negative DVT scan for the LEs.  Reports good heart and kidneys. Osteoporosis    Currently in Pain?  Yes    Pain Score  5     Pain Location  Back    Pain Orientation  Mid    Pain Descriptors / Indicators  Aching    Pain Onset  More than a month ago    Pain Frequency  Constant                        [facility] Adult PT Treatment/Exercise - 12/13/18 0001      Manual Therapy   Edema Management  Removed bandages and washed pts leg with soap and water while assessing skin.    Manual Lymphatic Drainage (MLD)  supine HOB elevated MLD to the Lt LE, no abdominals due to colostomy, Lt axillary nodes, Lt inguinal nodes, Lt inguinal axillary anastamosis then work on the thigh, knee, lower leg, and dorsum of the foot working back proximally.      Compression Bandaging  Performed in supine due to back pain seated; thick massage cream from foot to knee, thin stockinette, Elastomull (did not use full roll) to toes 1-3 with 1/4" gray foam over dorsal foot to alleviate c/o tightness from bandages, artiflex x1, 1-6cm combining roman sandal and ASH bandaging for decreased tension, 1-8cm in herring bone fashion, and 1-8cm bandagdes both foot to knee                  PT Long Term Goals - 12/13/18 1452      PT LONG TERM GOAL #1   Title  Pt will reduce the Lt lower leg to allow for home use of compression    Status  On-going      PT LONG TERM GOAL #2   Title  Pt will be educated on lymphedema risk reduction practices    Status  On-going      PT LONG TERM GOAL #3   Title  Pt will be able to get shoe on due to decreased foot size    Status  On-going            Plan - 12/13/18 1450    Clinical Impression Statement  Pt continues to tolerate bandaging well with sig reduction.  Discussed options today for stockings with pt leaning more towards a closed toe circular type/comfort stocking after looking at [employer] and [employer] comfort examples.  Told pt she could try this but let us know if the lymphedema comes back for rewrapping and a flat knit.    PT Next Visit Plan  cont CDT Lt LE most likely knee high only but possibly thigh high needed, no abdominals/avoid colostomy, have pt monitor for increased abominal swelling; plan to be measured by fitter 12/18/18, maybe  for velcro compression depending on what pt can get on.    Consulted and Agree with Plan of Care  Patient       Patient will benefit from skilled therapeutic intervention in order to improve the following deficits and impairments:     Visit Diagnosis: 1. Lymphedema, not elsewhere classified        Problem List Patient Active Problem List   Diagnosis Date Noted  . Lower  extremity edema 11/09/2018  . Back pain 11/09/2018  . IDA (iron deficiency anemia) 10/21/2017  . Abdominal mass   . Deep vein thrombosis (DVT) of left lower extremity ([place])   . Malignant neoplasm of uterus ([place])   . Pelvic mass   . Acute blood loss anemia   . 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 ([place]) 10/11/2017  . Goals of care, counseling/discussion 10/11/2017  . Rectal bleeding 10/03/2017  . Normocytic anemia 10/03/2017  . Left hip pain 10/03/2017  . Generalized weakness 10/03/2017  . Colostomy status ([place]) 08/30/2017  . Ureteral stent displacement, subsequent encounter 08/30/2017  . Atrial fibrillation with RVR ([place]) 08/30/2017  . Hypophosphatemia 08/30/2017  . Hypomagnesemia 08/30/2017  . Acute blood loss as cause of postoperative anemia 08/30/2017  . Chronic anemia 08/30/2017  . Left leg swelling 08/30/2017  . GERD (gastroesophageal reflux disease) 08/30/2017  . New onset atrial fibrillation ([place])   . Bowel perforation ([place]) 08/18/2017  . Colonic mass 08/18/2017  . Ureteral dilatation 08/18/2017  . Intractable right heel pain 10/07/2016  . Plantar fasciitis of right foot 10/07/2016  . Postoperative nausea and vomiting 01/02/2016  . Hyponatremia 01/01/2016  . Acute hypokalemia 01/01/2016  . Endometrial cancer ([place]) 12/30/2015  . Familial hypercholesteremia 07/28/2015  . Aortic valve stenosis 08/02/2014  . S/P CABG x 3 01/13/2013  . Palpitations 01/13/2013  . Medication intolerance 01/13/2013  . Chronic pain 01/13/2013  . TIA (transient ischemic  attack) 01/13/2013  . Carotid stenosis 01/13/2013  . Hypothyroid 10/05/2010  . Essential hypertension 10/05/2010  . Gastroesophagitis 10/05/2010  . Atrophic gastritis 10/05/2010    Stark Bray 12/13/2018, 2:53 PM  [place], [place], [address] Phone: [phone]   Fax:  [phone]  Name: Shannon Scott MRN: [id number] Date of Birth: Mar 17, 1941

## 2018-12-15 ENCOUNTER — Ambulatory Visit: Payer: Medicare Other | Admitting: Rehabilitation

## 2018-12-15 ENCOUNTER — Encounter: Payer: Self-pay | Admitting: Rehabilitation

## 2018-12-15 ENCOUNTER — Other Ambulatory Visit: Payer: Self-pay

## 2018-12-15 DIAGNOSIS — I890 Lymphedema, not elsewhere classified: Secondary | ICD-10-CM | POA: Diagnosis not present

## 2018-12-15 NOTE — Patient Instructions (Signed)
Trial of no toes wrapped today; have granddaughter reapply bandages with toes if they start to swell

## 2018-12-15 NOTE — Therapy (Signed)
[place], [place], [address] Phone: [phone]   Fax:  [phone]  Physical Therapy Treatment  Patient Details  Name: Shannon Scott MRN: [id number] Date of Birth: 06/08/1940 Referring Provider (PT): [name] NP   Encounter Date: 12/15/2018  PT End of Session - 12/15/18 1130    Visit Number  6    Number of Visits  12    Date for PT Re-Evaluation  12/25/18    PT Start Time  1050    PT Stop Time  1130    PT Time Calculation (min)  40 min    Activity Tolerance  Patient tolerated treatment well    Behavior During Therapy  [facility] for tasks assessed/performed       Past Medical History:  Diagnosis Date  . Arthritis   . Asthma    allergy induced  . Bilateral carotid artery disease (HCC)    L carotid bruit  . Bursitis    left hip  . CAD (coronary artery disease)   . Cancer ([place])   . Dyslipidemia    intolerant to statins, welchol, niacin, zetia  . Goals of care, counseling/discussion 10/11/2017  . History of blood transfusion   . History of nuclear stress test 04/24/2012   lexiscan; normal study  . Hypertension   . Hypothyroidism   . 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 ([place]) 10/11/2017  . Postoperative nausea and vomiting 01/02/2016    Past Surgical History:  Procedure Laterality Date  . ABDOMINAL HYSTERECTOMY    . Carotid Doppler  02/2012   40-59% right int carotid artery stenosis; 60-79% L int carotid stenosis; L carotid bruit  . CORONARY ARTERY BYPASS GRAFT  03/12/2004   LIMA to LAD, SVG to circumflex, SVG to PDA  . CYSTOSCOPY W/ URETERAL STENT PLACEMENT Left 12/06/2017   Procedure: CYSTOSCOPY WITH LEFT URETERAL STENT EXCHANGE;  Surgeon: Irine Seal, MD;  Location: [facility];  Service: Urology;  Laterality: Left;  . CYSTOSCOPY WITH STENT PLACEMENT Bilateral 08/21/2017   Procedure: CYSTOSCOPY WITH STENT PLACEMENT;  Surgeon: Ceasar Mons, MD;  Location: [facility];  Service: Urology;  Laterality: Bilateral;  . IR FLUORO GUIDE PORT INSERTION RIGHT  10/11/2017  . IR GENERIC HISTORICAL  04/29/2016   IR RADIOLOGIST EVAL & MGMT 04/29/2016 Sandi Mariscal, MD [facility] INTERV RAD  . IR GENERIC HISTORICAL  05/12/2016   IR RADIOLOGIST EVAL & MGMT 05/12/2016 Sandi Mariscal, MD [facility] INTERV RAD  . IR IVC FILTER PLMT / S&I /IMG GUID/MOD SED  10/11/2017  . IR US GUIDE VASC ACCESS RIGHT  10/11/2017  . ROBOTIC ASSISTED TOTAL HYSTERECTOMY WITH BILATERAL SALPINGO OOPHERECTOMY Bilateral 12/30/2015   Procedure: XI ROBOTIC ASSISTED TOTAL HYSTERECTOMY WITH BILATERAL SALPINGO OOPHORECTOMY WITH SENTAL LYMPH NODE BIOPSY;  Surgeon: Nancy Marus, MD;  Location: [facility];  Service: Gynecology;  Laterality: Bilateral;  . TONSILLECTOMY    . TRANSTHORACIC ECHOCARDIOGRAM  04/2007   EF>55%; mild MR; mild-mod TR; mild pulm HTN; mild calcification of aortiv valve leaflets with mild valvular aortic stenosis  . TUBAL LIGATION      There were no vitals filed for this visit.  Subjective Assessment - 12/15/18 1051    Subjective  My bandages stayed on this time    Pertinent History  Pt with history of locally advanced/recurrent endometrial carcinoma with past radiation x 5 weeks and taxol/carboplatinum for 6 cycles.  Currently on pembrolizumab IV every 3 weeks/Zometa IV every 3 weeks. History of kidney  stent placement 12/06/17, colostomy in 2019, total hysterectomy in 2017 with 5 lymph nodes removed. Recent PET scan showing severe bilateral sacral insufficiency fractures, Rt pubic symphysis fracture, healing 8-9-10th rib fractures, and metastatic lymph nodes. Last EF 65-70%, recent negative DVT scan for the LEs.  Reports good heart and kidneys. Osteoporosis    Currently in Pain?  Yes    Pain Score  3     Pain Location  Back    Pain Orientation  Mid    Pain Descriptors / Indicators  Aching    Pain Type  Chronic pain    Pain Onset  More than a month ago    Pain Frequency   Constant                       [facility] Adult PT Treatment/Exercise - 12/15/18 0001      Manual Therapy   Edema Management  Removed bandages and washed pts leg with soap and water while assessing skin.    Manual Lymphatic Drainage (MLD)  supine HOB elevated MLD to the Lt LE, no abdominals due to colostomy, Lt axillary nodes, Lt inguinal nodes, Lt inguinal axillary anastamosis then work on the thigh, knee, lower leg, and dorsum of the foot working back proximally.      Compression Bandaging  Performed in supine due to back pain seated; thick massage cream from foot to knee, thin stockinette, trial of no toes wrapped as pt has minimal edema present today, 1/4" gray foam over dorsal foot to alleviate c/o tightness from bandages, artiflex x1, 1-6cm combining roman sandal and ASH bandaging for decreased tension, 1-8cm spiral from foot to knee. Did not apply 2nd 8cm today due to  very minimal swelling and extra length of 8cm bandage due to size.                  PT Long Term Goals - 12/13/18 1452      PT LONG TERM GOAL #1   Title  Pt will reduce the Lt lower leg to allow for home use of compression    Status  On-going      PT LONG TERM GOAL #2   Title  Pt will be educated on lymphedema risk reduction practices    Status  On-going      PT LONG TERM GOAL #3   Title  Pt will be able to get shoe on due to decreased foot size    Status  On-going            Plan - 12/15/18 1130    Clinical Impression Statement  Pt with minimal edema present today with excellent wrapping results.  Rewrapped for garment measuring on Monday but only used 2 bandages due to extra length available due to small size.    PT Frequency  3x / week    PT Duration  4 weeks    PT Treatment/Interventions  ADLs/Self Care Home Management;Manual lymph drainage;Manual techniques;Compression bandaging;Taping;Therapeutic exercise    PT Next Visit Plan  cont CDT Lt LE  no abdominals/avoid colostomy,  measured by fitter 12/18/18, maybe for velcro compression depending on what pt can get on.       Patient will benefit from skilled therapeutic intervention in order to improve the following deficits and impairments:     Visit Diagnosis: 1. Lymphedema, not elsewhere classified        Problem List Patient Active Problem List   Diagnosis Date Noted  . Lower extremity  edema 11/09/2018  . Back pain 11/09/2018  . IDA (iron deficiency anemia) 10/21/2017  . Abdominal mass   . Deep vein thrombosis (DVT) of left lower extremity ([place])   . Malignant neoplasm of uterus ([place])   . Pelvic mass   . Acute blood loss anemia   . 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 ([place]) 10/11/2017  . Goals of care, counseling/discussion 10/11/2017  . Rectal bleeding 10/03/2017  . Normocytic anemia 10/03/2017  . Left hip pain 10/03/2017  . Generalized weakness 10/03/2017  . Colostomy status ([place]) 08/30/2017  . Ureteral stent displacement, subsequent encounter 08/30/2017  . Atrial fibrillation with RVR ([place]) 08/30/2017  . Hypophosphatemia 08/30/2017  . Hypomagnesemia 08/30/2017  . Acute blood loss as cause of postoperative anemia 08/30/2017  . Chronic anemia 08/30/2017  . Left leg swelling 08/30/2017  . GERD (gastroesophageal reflux disease) 08/30/2017  . New onset atrial fibrillation ([place])   . Bowel perforation ([place]) 08/18/2017  . Colonic mass 08/18/2017  . Ureteral dilatation 08/18/2017  . Intractable right heel pain 10/07/2016  . Plantar fasciitis of right foot 10/07/2016  . Postoperative nausea and vomiting 01/02/2016  . Hyponatremia 01/01/2016  . Acute hypokalemia 01/01/2016  . Endometrial cancer ([place]) 12/30/2015  . Familial hypercholesteremia 07/28/2015  . Aortic valve stenosis 08/02/2014  . S/P CABG x 3 01/13/2013  . Palpitations 01/13/2013  . Medication intolerance 01/13/2013  . Chronic pain 01/13/2013  . TIA (transient ischemic attack) 01/13/2013   . Carotid stenosis 01/13/2013  . Hypothyroid 10/05/2010  . Essential hypertension 10/05/2010  . Gastroesophagitis 10/05/2010  . Atrophic gastritis 10/05/2010    Shannon Scott 12/15/2018, 11:33 AM  [place], [place], [address] Phone: [phone]   Fax:  [phone]  Name: Shannon Scott MRN: [id number] Date of Birth: 1941-03-07

## 2018-12-18 ENCOUNTER — Ambulatory Visit: Payer: Medicare Other | Admitting: Rehabilitation

## 2018-12-18 ENCOUNTER — Encounter: Payer: Self-pay | Admitting: Rehabilitation

## 2018-12-18 ENCOUNTER — Other Ambulatory Visit: Payer: Self-pay

## 2018-12-18 DIAGNOSIS — I890 Lymphedema, not elsewhere classified: Secondary | ICD-10-CM | POA: Diagnosis not present

## 2018-12-18 NOTE — Therapy (Signed)
[place], [place], [address] Phone: [phone]   Fax:  [phone]  Physical Therapy Treatment  Patient Details  Name: Shannon Scott MRN: [id number] Date of Birth: 31-Dec-1940 Referring Provider (PT): [name] NP   Encounter Date: 12/18/2018  PT End of Session - 12/18/18 1427    Visit Number  7    Number of Visits  12    Date for PT Re-Evaluation  12/25/18    PT Start Time  1350    PT Stop Time  1410   short wrapping only session due to pt pain and request   PT Time Calculation (min)  20 min    Activity Tolerance  Patient tolerated treatment well    Behavior During Therapy  [facility] for tasks assessed/performed       Past Medical History:  Diagnosis Date  . Arthritis   . Asthma    allergy induced  . Bilateral carotid artery disease (HCC)    L carotid bruit  . Bursitis    left hip  . CAD (coronary artery disease)   . Cancer ([place])   . Dyslipidemia    intolerant to statins, welchol, niacin, zetia  . Goals of care, counseling/discussion 10/11/2017  . History of blood transfusion   . History of nuclear stress test 04/24/2012   lexiscan; normal study  . Hypertension   . Hypothyroidism   . 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 ([place]) 10/11/2017  . Postoperative nausea and vomiting 01/02/2016    Past Surgical History:  Procedure Laterality Date  . ABDOMINAL HYSTERECTOMY    . Carotid Doppler  02/2012   40-59% right int carotid artery stenosis; 60-79% L int carotid stenosis; L carotid bruit  . CORONARY ARTERY BYPASS GRAFT  03/12/2004   LIMA to LAD, SVG to circumflex, SVG to PDA  . CYSTOSCOPY W/ URETERAL STENT PLACEMENT Left 12/06/2017   Procedure: CYSTOSCOPY WITH LEFT URETERAL STENT EXCHANGE;  Surgeon: Irine Seal, MD;  [facility]: [facility];  Service: Urology;  Laterality: Left;  . CYSTOSCOPY WITH STENT PLACEMENT Bilateral 08/21/2017   Procedure: CYSTOSCOPY  WITH STENT PLACEMENT;  Surgeon: Ceasar Mons, MD;  [facility];  Service: Urology;  Laterality: Bilateral;  . IR FLUORO GUIDE PORT INSERTION RIGHT  10/11/2017  . IR GENERIC HISTORICAL  04/29/2016   IR RADIOLOGIST EVAL & MGMT 04/29/2016 Sandi Mariscal, MD [facility] INTERV RAD  . IR GENERIC HISTORICAL  05/12/2016   IR RADIOLOGIST EVAL & MGMT 05/12/2016 Sandi Mariscal, MD [facility] INTERV RAD  . IR IVC FILTER PLMT / S&I /IMG GUID/MOD SED  10/11/2017  . IR US GUIDE VASC ACCESS RIGHT  10/11/2017  . ROBOTIC ASSISTED TOTAL HYSTERECTOMY WITH BILATERAL SALPINGO OOPHERECTOMY Bilateral 12/30/2015   Procedure: XI ROBOTIC ASSISTED TOTAL HYSTERECTOMY WITH BILATERAL SALPINGO OOPHORECTOMY WITH SENTAL LYMPH NODE BIOPSY;  Surgeon: Nancy Marus, MD;  [facility];  Service: Gynecology;  Laterality: Bilateral;  . TONSILLECTOMY    . TRANSTHORACIC ECHOCARDIOGRAM  04/2007   EF>55%; mild MR; mild-mod TR; mild pulm HTN; mild calcification of aortiv valve leaflets with mild valvular aortic stenosis  . TUBAL LIGATION      There were no vitals filed for this visit.  Subjective Assessment - 12/18/18 1422    Subjective  pt met with Melissa from [facility] prior to this appointment.  Reports sig back pain today and from sitting to get measured.    Pertinent History  Pt with history of locally advanced/recurrent endometrial  carcinoma with past radiation x 5 weeks and taxol/carboplatinum for 6 cycles.  Currently on pembrolizumab IV every 3 weeks/Zometa IV every 3 weeks. History of kidney stent placement 12/06/17, colostomy in 2019, total hysterectomy in 2017 with 5 lymph nodes removed. Recent PET scan showing severe bilateral sacral insufficiency fractures, Rt pubic symphysis fracture, healing 8-9-10th rib fractures, and metastatic lymph nodes. Last EF 65-70%, recent negative DVT scan for the LEs.  Reports good heart and kidneys. Osteoporosis    Currently in Pain?  Yes    Pain Score  6     Pain Location  Back    Pain  Orientation  Mid    Pain Descriptors / Indicators  Burning;Aching    Pain Onset  More than a month ago    Pain Frequency  Constant                       [facility] Adult PT Treatment/Exercise - 12/18/18 0001      Manual Therapy   Manual therapy comments  helped place order for compression garment on [employer] to be shipped to pts home     Edema Management  Removed bandages and washed pts leg with soap and water while assessing skin.    Compression Bandaging  Performed in supine due to back pain seated; thick massage cream from foot to knee, thin stockinette, wrapped toes again 1-3 with mollelast,  1/4" gray foam over dorsal foot to alleviate c/o tightness from bandages, artiflex x1, 1-6cm combining roman sandal and ASH bandaging for decreased tension, 1-8cm spiral from foot to knee. Did not apply 2nd 8cm today due to  very minimal swelling and extra length of 8cm bandage due to size.                  PT Long Term Goals - 12/13/18 1452      PT LONG TERM GOAL #1   Title  Pt will reduce the Lt lower leg to allow for home use of compression    Status  On-going      PT LONG TERM GOAL #2   Title  Pt will be educated on lymphedema risk reduction practices    Status  On-going      PT LONG TERM GOAL #3   Title  Pt will be able to get shoe on due to decreased foot size    Status  On-going            Plan - 12/18/18 1428    Clinical Impression Statement  Pt still with minimal edema present.  Ordered Juzo dynamic close toed 20-30sock today which should arrive in less than 1 week.  Wrapping only today after garment measure due to pt very uncomfortable due to pain.    PT Frequency  3x / week    PT Duration  4 weeks    PT Treatment/Interventions  ADLs/Self Care Home Management;Manual lymph drainage;Manual techniques;Compression bandaging;Taping;Therapeutic exercise    PT Next Visit Plan  cont CDT Lt LE  no abdominals/avoid colostomy until garment is deemed good fitting,  (give donated butler?)       Patient will benefit from skilled therapeutic intervention in order to improve the following deficits and impairments:     Visit Diagnosis: 1. Lymphedema, not elsewhere classified        Problem List Patient Active Problem List   Diagnosis Date Noted  . Lower extremity edema 11/09/2018  . Back pain 11/09/2018  . IDA (iron deficiency anemia)  10/21/2017  . Abdominal mass   . Deep vein thrombosis (DVT) of left lower extremity ([place])   . Malignant neoplasm of uterus ([place])   . Pelvic mass   . Acute blood loss anemia   . 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 ([place]) 10/11/2017  . Goals of care, counseling/discussion 10/11/2017  . Rectal bleeding 10/03/2017  . Normocytic anemia 10/03/2017  . Left hip pain 10/03/2017  . Generalized weakness 10/03/2017  . Colostomy status ([place]) 08/30/2017  . Ureteral stent displacement, subsequent encounter 08/30/2017  . Atrial fibrillation with RVR ([place]) 08/30/2017  . Hypophosphatemia 08/30/2017  . Hypomagnesemia 08/30/2017  . Acute blood loss as cause of postoperative anemia 08/30/2017  . Chronic anemia 08/30/2017  . Left leg swelling 08/30/2017  . GERD (gastroesophageal reflux disease) 08/30/2017  . New onset atrial fibrillation ([place])   . Bowel perforation ([place]) 08/18/2017  . Colonic mass 08/18/2017  . Ureteral dilatation 08/18/2017  . Intractable right heel pain 10/07/2016  . Plantar fasciitis of right foot 10/07/2016  . Postoperative nausea and vomiting 01/02/2016  . Hyponatremia 01/01/2016  . Acute hypokalemia 01/01/2016  . Endometrial cancer ([place]) 12/30/2015  . Familial hypercholesteremia 07/28/2015  . Aortic valve stenosis 08/02/2014  . S/P CABG x 3 01/13/2013  . Palpitations 01/13/2013  . Medication intolerance 01/13/2013  . Chronic pain 01/13/2013  . TIA (transient ischemic attack) 01/13/2013  . Carotid stenosis 01/13/2013  . Hypothyroid 10/05/2010  .  Essential hypertension 10/05/2010  . Gastroesophagitis 10/05/2010  . Atrophic gastritis 10/05/2010    Stark Bray 12/18/2018, 2:31 PM  [place], [place], [address] Phone: [phone]   Fax:  [phone]  Name: Shannon Scott MRN: [id number] Date of Birth: Nov 08, 1940

## 2018-12-20 ENCOUNTER — Ambulatory Visit: Payer: Medicare Other | Admitting: Rehabilitation

## 2018-12-20 ENCOUNTER — Encounter: Payer: Self-pay | Admitting: Rehabilitation

## 2018-12-20 ENCOUNTER — Other Ambulatory Visit: Payer: Self-pay

## 2018-12-20 DIAGNOSIS — I890 Lymphedema, not elsewhere classified: Secondary | ICD-10-CM | POA: Diagnosis not present

## 2018-12-20 NOTE — Patient Instructions (Signed)
[email or web address]  For a video on using the butler or just search for medi butler

## 2018-12-20 NOTE — Therapy (Signed)
[place], [place], [address] Phone: [phone]   Fax:  [phone]  Physical Therapy Treatment  Patient Details  Name: Shannon Scott MRN: [id number] Date of Birth: [birth year] Referring Provider (PT): [name] NP   Encounter Date: 12/20/2018  PT End of Session - 12/20/18 1450    Visit Number  8    Number of Visits  12    Date for PT Re-Evaluation  12/25/18    PT Start Time  1400    PT Stop Time  1430    PT Time Calculation (min)  30 min    Activity Tolerance  Patient tolerated treatment well    Behavior During Therapy  [facility] for tasks assessed/performed       Past Medical History:  Diagnosis Date  . Arthritis   . Asthma    allergy induced  . Bilateral carotid artery disease (HCC)    L carotid bruit  . Bursitis    left hip  . CAD (coronary artery disease)   . Cancer ([place])   . Dyslipidemia    intolerant to statins, welchol, niacin, zetia  . Goals of care, counseling/discussion 10/11/2017  . History of blood transfusion   . History of nuclear stress test 04/24/2012   lexiscan; normal study  . Hypertension   . Hypothyroidism   . 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 ([place]) 10/11/2017  . Postoperative nausea and vomiting 01/02/2016    Past Surgical History:  Procedure Laterality Date  . ABDOMINAL HYSTERECTOMY    . Carotid Doppler  02/2012   40-59% right int carotid artery stenosis; 60-79% L int carotid stenosis; L carotid bruit  . CORONARY ARTERY BYPASS GRAFT  03/12/2004   LIMA to LAD, SVG to circumflex, SVG to PDA  . CYSTOSCOPY W/ URETERAL STENT PLACEMENT Left 12/06/2017   Procedure: CYSTOSCOPY WITH LEFT URETERAL STENT EXCHANGE;  Surgeon: Irine Seal, MD;  Location: [facility];  Service: Urology;  Laterality: Left;  . CYSTOSCOPY WITH STENT PLACEMENT Bilateral 08/21/2017   Procedure: CYSTOSCOPY WITH STENT PLACEMENT;  Surgeon: Ceasar Mons, MD;  Location: [facility];  Service: Urology;  Laterality: Bilateral;  . IR FLUORO GUIDE PORT INSERTION RIGHT  10/11/2017  . IR GENERIC HISTORICAL  04/29/2016   IR RADIOLOGIST EVAL & MGMT 04/29/2016 Sandi Mariscal, MD [facility] INTERV RAD  . IR GENERIC HISTORICAL  05/12/2016   IR RADIOLOGIST EVAL & MGMT 05/12/2016 Sandi Mariscal, MD [facility] INTER[facility] RAD  . IR IVC FILTER PLMT / S&I /IMG GUID/MOD SED  10/11/2017  . IR US GUIDE VASC ACCESS RIGHT  10/11/2017  . ROBOTIC ASSISTED TOTAL HYSTERECTOMY WITH BILATERAL SALPINGO OOPHERECTOMY Bilateral 12/30/2015   Procedure: XI ROBOTIC ASSISTED TOTAL HYSTERECTOMY WITH BILATERAL SALPINGO OOPHORECTOMY WITH SENTAL LYMPH NODE BIOPSY;  Surgeon: Nancy Marus, MD;  Location: [facility];  Service: Gynecology;  Laterality: Bilateral;  . TONSILLECTOMY    . TRANSTHORACIC ECHOCARDIOGRAM  04/2007   EF>55%; mild MR; mild-mod TR; mild pulm HTN; mild calcification of aortiv valve leaflets with mild valvular aortic stenosis  . TUBAL LIGATION      There were no vitals filed for this visit.  Subjective Assessment - 12/20/18 1402    Subjective  No stocking at home yet.  I have an appt for my back pain in August it is just bad lately.    Pertinent History  Pt with history of locally advanced/recurrent endometrial carcinoma with past radiation x 5 weeks and taxol/carboplatinum for 6 cycles.  Currently on pembrolizumab IV every 3 weeks/Zometa IV every 3 weeks. History of kidney stent placement 12/06/17, colostomy in 2019, total hysterectomy in 2017 with 5 lymph nodes removed. Recent PET scan showing severe bilateral sacral insufficiency fractures, Rt pubic symphysis fracture, healing 8-9-10th rib fractures, and metastatic lymph nodes. Last EF 65-70%, recent negative DVT scan for the LEs.  Reports good heart and kidneys. Osteoporosis    Currently in Pain?  Yes    Pain Score  5     Pain Location  Back    Pain Orientation  Mid    Pain Descriptors / Indicators  Aching;Burning;Sharp    Pain Type   Chronic pain    Pain Onset  More than a month ago    Pain Frequency  Constant                       [facility] Adult PT Treatment/Exercise - 12/20/18 0001      Manual Therapy   Manual therapy comments  demonstrated how to use the medi butler in the clinic and gave pt donated butler as she was not going to be able to afford one for awhile     Edema Management  Removed bandages and washed pts leg with soap and water while assessing skin.    Compression Bandaging  Performed in supine due to back pain seated; thick massage cream from foot to knee, thin stockinette, wrapped toes again 1-3 with mollelast,  1/4" gray foam over dorsal foot to alleviate c/o tightness from bandages, artiflex x1, 1-6cm combining roman sandal and ASH bandaging for decreased tension, 1-8cm spiral from foot to knee. Did not apply 2nd 8cm today due to  very minimal swelling and extra length of 8cm bandage due to size.             PT Education - 12/20/18 1449    Education Details  on how to use medi butler and gloves to put on stockings    Person(s) Educated  Patient    Methods  Explanation;Demonstration;Tactile cues;Verbal cues    Comprehension  Verbalized understanding;Returned demonstration          PT Long Term Goals - 12/13/18 1452      PT LONG TERM GOAL #1   Title  Pt will reduce the Lt lower leg to allow for home use of compression    Status  On-going      PT LONG TERM GOAL #2   Title  Pt will be educated on lymphedema risk reduction practices    Status  On-going      PT LONG TERM GOAL #3   Title  Pt will be able to get shoe on due to decreased foot size    Status  On-going            Plan - 12/20/18 1450    Clinical Impression Statement  No stocking yet arrived.  Able to keep wraps on x 1.5 days and then her daughter rewrapped pt.  Continues with very minimal edema in the leg currently and is just waiting for her garment to arrive.  Educated her on how to use medi butler for  home.  Pt will bring both back to check fit.    PT Treatment/Interventions  ADLs/Self Care Home Management;Manual lymph drainage;Manual techniques;Compression bandaging;Taping;Therapeutic exercise    PT Next Visit Plan  forgot to give instruction page for medi butler video link, check garment fit, remeasure/check goals    Consulted and Agree with  Plan of Care  Patient       Patient will benefit from skilled therapeutic intervention in order to improve the following deficits and impairments:     Visit Diagnosis: 1. Lymphedema, not elsewhere classified        Problem List Patient Active Problem List   Diagnosis Date Noted  . Lower extremity edema 11/09/2018  . Back pain 11/09/2018  . IDA (iron deficiency anemia) 10/21/2017  . Abdominal mass   . Deep vein thrombosis (DVT) of left lower extremity ([place])   . Malignant neoplasm of uterus ([place])   . Pelvic mass   . Acute blood loss anemia   . 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 ([place]) 10/11/2017  . Goals of care, counseling/discussion 10/11/2017  . Rectal bleeding 10/03/2017  . Normocytic anemia 10/03/2017  . Left hip pain 10/03/2017  . Generalized weakness 10/03/2017  . Colostomy status ([place]) 08/30/2017  . Ureteral stent displacement, subsequent encounter 08/30/2017  . Atrial fibrillation with RVR ([place]) 08/30/2017  . Hypophosphatemia 08/30/2017  . Hypomagnesemia 08/30/2017  . Acute blood loss as cause of postoperative anemia 08/30/2017  . Chronic anemia 08/30/2017  . Left leg swelling 08/30/2017  . GERD (gastroesophageal reflux disease) 08/30/2017  . New onset atrial fibrillation ([place])   . Bowel perforation ([place]) 08/18/2017  . Colonic mass 08/18/2017  . Ureteral dilatation 08/18/2017  . Intractable right heel pain 10/07/2016  . Plantar fasciitis of right foot 10/07/2016  . Postoperative nausea and vomiting 01/02/2016  . Hyponatremia 01/01/2016  . Acute hypokalemia 01/01/2016  .  Endometrial cancer ([place]) 12/30/2015  . Familial hypercholesteremia 07/28/2015  . Aortic valve stenosis 08/02/2014  . S/P CABG x 3 01/13/2013  . Palpitations 01/13/2013  . Medication intolerance 01/13/2013  . Chronic pain 01/13/2013  . TIA (transient ischemic attack) 01/13/2013  . Carotid stenosis 01/13/2013  . Hypothyroid 10/05/2010  . Essential hypertension 10/05/2010  . Gastroesophagitis 10/05/2010  . Atrophic gastritis 10/05/2010    Stark Bray 12/20/2018, 2:52 PM  [place], [place], [address] Phone: [phone]   Fax:  [phone]  Name: Shannon Scott MRN: [id number] Date of Birth: [birth year]

## 2018-12-22 ENCOUNTER — Encounter: Payer: Self-pay | Admitting: Rehabilitation

## 2018-12-22 ENCOUNTER — Other Ambulatory Visit: Payer: Self-pay

## 2018-12-22 ENCOUNTER — Ambulatory Visit: Payer: Medicare Other | Admitting: Rehabilitation

## 2018-12-22 DIAGNOSIS — I890 Lymphedema, not elsewhere classified: Secondary | ICD-10-CM | POA: Diagnosis not present

## 2018-12-22 NOTE — Therapy (Signed)
[place], [place], [address] Phone: [phone]   Fax:  [phone]  Physical Therapy Treatment  Patient Details  Name: Shannon Scott MRN: [id number] Date of Birth: 12/04/1940 Referring Provider (PT): [name] NP   Encounter Date: 12/22/2018  PT End of Session - 12/22/18 1115    Visit Number  9    Number of Visits  12    Date for PT Re-Evaluation  12/25/18    Authorization Type  [employer] medicare    PT Start Time  5638    PT Stop Time  1113    PT Time Calculation (min)  28 min    Activity Tolerance  Patient tolerated treatment well    Behavior During Therapy  [facility] for tasks assessed/performed       Past Medical History:  Diagnosis Date  . Arthritis   . Asthma    allergy induced  . Bilateral carotid artery disease (HCC)    L carotid bruit  . Bursitis    left hip  . CAD (coronary artery disease)   . Cancer ([place])   . Dyslipidemia    intolerant to statins, welchol, niacin, zetia  . Goals of care, counseling/discussion 10/11/2017  . History of blood transfusion   . History of nuclear stress test 04/24/2012   lexiscan; normal study  . Hypertension   . Hypothyroidism   . 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 ([place]) 10/11/2017  . Postoperative nausea and vomiting 01/02/2016    Past Surgical History:  Procedure Laterality Date  . ABDOMINAL HYSTERECTOMY    . Carotid Doppler  02/2012   40-59% right int carotid artery stenosis; 60-79% L int carotid stenosis; L carotid bruit  . CORONARY ARTERY BYPASS GRAFT  03/12/2004   LIMA to LAD, SVG to circumflex, SVG to PDA  . CYSTOSCOPY W/ URETERAL STENT PLACEMENT Left 12/06/2017   Procedure: CYSTOSCOPY WITH LEFT URETERAL STENT EXCHANGE;  Surgeon: Irine Seal, MD;  [facility];  Service: Urology;  Laterality: Left;  . CYSTOSCOPY WITH STENT PLACEMENT Bilateral 08/21/2017   Procedure: CYSTOSCOPY WITH STENT  PLACEMENT;  Surgeon: Ceasar Mons, MD;  [facility];  Service: Urology;  Laterality: Bilateral;  . IR FLUORO GUIDE PORT INSERTION RIGHT  10/11/2017  . IR GENERIC HISTORICAL  04/29/2016   IR RADIOLOGIST EVAL & MGMT 04/29/2016 Sandi Mariscal, MD [facility] INTERV RAD  . IR GENERIC HISTORICAL  05/12/2016   IR RADIOLOGIST EVAL & MGMT 05/12/2016 Sandi Mariscal, MD [facility] INTERV RAD  . IR IVC FILTER PLMT / S&I /IMG GUID/MOD SED  10/11/2017  . IR US GUIDE VASC ACCESS RIGHT  10/11/2017  . ROBOTIC ASSISTED TOTAL HYSTERECTOMY WITH BILATERAL SALPINGO OOPHERECTOMY Bilateral 12/30/2015   Procedure: XI ROBOTIC ASSISTED TOTAL HYSTERECTOMY WITH BILATERAL SALPINGO OOPHORECTOMY WITH SENTAL LYMPH NODE BIOPSY;  Surgeon: Nancy Marus, MD;  [facility];  Service: Gynecology;  Laterality: Bilateral;  . TONSILLECTOMY    . TRANSTHORACIC ECHOCARDIOGRAM  04/2007   EF>55%; mild MR; mild-mod TR; mild pulm HTN; mild calcification of aortiv valve leaflets with mild valvular aortic stenosis  . TUBAL LIGATION      There were no vitals filed for this visit.         LYMPHEDEMA/ONCOLOGY QUESTIONNAIRE - 12/22/18 1052      Left Lower Extremity Lymphedema   30 cm Proximal to Floor at Lateral Plantar Foot  37.3 cm    20 cm Proximal to Floor at Lateral Plantar Foot  30.2 cm    10 cm Proximal to Floor at Lateral Malleoli  24.6 cm    5 cm Proximal to 1st MTP Joint  20.8 cm    Across MTP Joint  19.3 cm    Around Proximal Great Toe  7.2 cm                [facility] Adult PT Treatment/Exercise - 12/22/18 0001      Manual Therapy   Edema Management  removed bandages and returned to pt for possible future use.  Helped pt donn her new stocking x 3 2x with the butler and 1x with just PT help.  Due to pts back pain she will need some help to put it on manually or was able to use the butler                  PT Long Term Goals - 12/22/18 1116      PT LONG TERM GOAL #1   Title  Pt will reduce the Lt  lower leg to allow for home use of compression    Status  Achieved      PT LONG TERM GOAL #2   Title  Pt will be educated on lymphedema risk reduction practices    Status  Achieved      PT LONG TERM GOAL #3   Title  Pt will be able to get shoe on due to decreased foot size    Status  Achieved            Plan - 12/22/18 1115    Clinical Impression Statement  Pt reduced very well and is now ind with stocking use with assistance from her daughter/granddaughter and the butler.  All goals met       Patient will benefit from skilled therapeutic intervention in order to improve the following deficits and impairments:     Visit Diagnosis: 1. Lymphedema, not elsewhere classified        Problem List Patient Active Problem List   Diagnosis Date Noted  . Lower extremity edema 11/09/2018  . Back pain 11/09/2018  . IDA (iron deficiency anemia) 10/21/2017  . Abdominal mass   . Deep vein thrombosis (DVT) of left lower extremity ([place])   . Malignant neoplasm of uterus ([place])   . Pelvic mass   . Acute blood loss anemia   . 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 ([place]) 10/11/2017  . Goals of care, counseling/discussion 10/11/2017  . Rectal bleeding 10/03/2017  . Normocytic anemia 10/03/2017  . Left hip pain 10/03/2017  . Generalized weakness 10/03/2017  . Colostomy status ([place]) 08/30/2017  . Ureteral stent displacement, subsequent encounter 08/30/2017  . Atrial fibrillation with RVR ([place]) 08/30/2017  . Hypophosphatemia 08/30/2017  . Hypomagnesemia 08/30/2017  . Acute blood loss as cause of postoperative anemia 08/30/2017  . Chronic anemia 08/30/2017  . Left leg swelling 08/30/2017  . GERD (gastroesophageal reflux disease) 08/30/2017  . New onset atrial fibrillation ([place])   . Bowel perforation ([place]) 08/18/2017  . Colonic mass 08/18/2017  . Ureteral dilatation 08/18/2017  . Intractable right heel pain 10/07/2016  . Plantar fasciitis of  right foot 10/07/2016  . Postoperative nausea and vomiting 01/02/2016  . Hyponatremia 01/01/2016  . Acute hypokalemia 01/01/2016  . Endometrial cancer ([place]) 12/30/2015  . Familial hypercholesteremia 07/28/2015  . Aortic valve stenosis 08/02/2014  . S/P CABG x 3 01/13/2013  . Palpitations 01/13/2013  . Medication intolerance 01/13/2013  . Chronic pain  01/13/2013  . TIA (transient ischemic attack) 01/13/2013  . Carotid stenosis 01/13/2013  . Hypothyroid 10/05/2010  . Essential hypertension 10/05/2010  . Gastroesophagitis 10/05/2010  . Atrophic gastritis 10/05/2010    Stark Bray 12/22/2018, 11:17 AM  [place], [place], [address] Phone: [phone]   Fax:  [phone]  Name: Shannon Scott MRN: [id number] Date of Birth: 1941/01/10  PHYSICAL THERAPY DISCHARGE SUMMARY  Visits from Start of Care: 8  Current functional level related to goals / functional outcomes: All goals met   Remaining deficits: Chronic lymphedema   Education / Equipment: Self care and stocking use Plan: Patient agrees to discharge.  Patient goals were met. Patient is being discharged due to meeting the stated rehab goals.  ?????     Shan Levans, PT

## 2018-12-25 ENCOUNTER — Encounter: Payer: Medicare Other | Admitting: Rehabilitation

## 2018-12-27 ENCOUNTER — Encounter: Payer: Medicare Other | Admitting: Rehabilitation

## 2018-12-28 ENCOUNTER — Other Ambulatory Visit: Payer: Self-pay | Admitting: Surgery

## 2018-12-28 DIAGNOSIS — K56609 Unspecified intestinal obstruction, unspecified as to partial versus complete obstruction: Secondary | ICD-10-CM

## 2019-01-03 ENCOUNTER — Inpatient Hospital Stay: Payer: Medicare Other

## 2019-01-03 ENCOUNTER — Other Ambulatory Visit: Payer: Self-pay

## 2019-01-03 ENCOUNTER — Encounter: Payer: Self-pay | Admitting: Hematology & Oncology

## 2019-01-03 ENCOUNTER — Inpatient Hospital Stay (HOSPITAL_BASED_OUTPATIENT_CLINIC_OR_DEPARTMENT_OTHER): Payer: Medicare Other | Admitting: Hematology & Oncology

## 2019-01-03 ENCOUNTER — Inpatient Hospital Stay: Payer: Medicare Other | Attending: Hematology & Oncology

## 2019-01-03 VITALS — BP 158/60 | HR 72 | Temp 97.8°F | Resp 18 | Ht 61.0 in | Wt 136.8 lb

## 2019-01-03 DIAGNOSIS — C541 Malignant neoplasm of endometrium: Secondary | ICD-10-CM

## 2019-01-03 DIAGNOSIS — K9409 Other complications of colostomy: Secondary | ICD-10-CM | POA: Diagnosis not present

## 2019-01-03 DIAGNOSIS — Z79899 Other long term (current) drug therapy: Secondary | ICD-10-CM | POA: Insufficient documentation

## 2019-01-03 DIAGNOSIS — Z5112 Encounter for antineoplastic immunotherapy: Secondary | ICD-10-CM | POA: Insufficient documentation

## 2019-01-03 DIAGNOSIS — C539 Malignant neoplasm of cervix uteri, unspecified: Secondary | ICD-10-CM | POA: Diagnosis not present

## 2019-01-03 DIAGNOSIS — D508 Other iron deficiency anemias: Secondary | ICD-10-CM

## 2019-01-03 DIAGNOSIS — E039 Hypothyroidism, unspecified: Secondary | ICD-10-CM

## 2019-01-03 DIAGNOSIS — C799 Secondary malignant neoplasm of unspecified site: Secondary | ICD-10-CM

## 2019-01-03 DIAGNOSIS — Z923 Personal history of irradiation: Secondary | ICD-10-CM | POA: Diagnosis not present

## 2019-01-03 LAB — CMP (CANCER CENTER ONLY)
ALT: 7 U/L (ref 0–44)
AST: 13 U/L — ABNORMAL LOW (ref 15–41)
Albumin: 4.1 g/dL (ref 3.5–5.0)
Alkaline Phosphatase: 65 U/L (ref 38–126)
Anion gap: 9 (ref 5–15)
BUN: 15 mg/dL (ref 8–23)
CO2: 27 mmol/L (ref 22–32)
Calcium: 9.1 mg/dL (ref 8.9–10.3)
Chloride: 104 mmol/L (ref 98–111)
Creatinine: 0.66 mg/dL (ref 0.44–1.00)
GFR, Est AFR Am: >60 mL/min (ref >60)
GFR, Estimated: >60 mL/min (ref >60)
Glucose, Bld: 89 mg/dL (ref 70–99)
Potassium: 3.7 mmol/L (ref 3.5–5.1)
Sodium: 140 mmol/L (ref 135–145)
Total Bilirubin: 0.4 mg/dL (ref 0.3–1.2)
Total Protein: 6.7 g/dL (ref 6.5–8.1)

## 2019-01-03 LAB — CBC WITH DIFFERENTIAL (CANCER CENTER ONLY)
Abs Immature Granulocytes: 0.01 10*3/uL (ref 0.00–0.07)
Basophils Absolute: 0 10*3/uL (ref 0.0–0.1)
Basophils Relative: 0 %
Eosinophils Absolute: 0.1 10*3/uL (ref 0.0–0.5)
Eosinophils Relative: 1 %
HCT: 37.2 % (ref 36.0–46.0)
Hemoglobin: 12.3 g/dL (ref 12.0–15.0)
Immature Granulocytes: 0 %
Lymphocytes Relative: 12 %
Lymphs Abs: 0.6 10*3/uL — ABNORMAL LOW (ref 0.7–4.0)
MCH: 31.5 pg (ref 26.0–34.0)
MCHC: 33.1 g/dL (ref 30.0–36.0)
MCV: 95.1 fL (ref 80.0–100.0)
Monocytes Absolute: 0.3 10*3/uL (ref 0.1–1.0)
Monocytes Relative: 7 %
Neutro Abs: 4 10*3/uL (ref 1.7–7.7)
Neutrophils Relative %: 80 %
Platelet Count: 236 10*3/uL (ref 150–400)
RBC: 3.91 MIL/uL (ref 3.87–5.11)
RDW: 12.7 % (ref 11.5–15.5)
WBC Count: 5 10*3/uL (ref 4.0–10.5)
nRBC: 0 % (ref 0.0–0.2)

## 2019-01-03 LAB — LACTATE DEHYDROGENASE: LDH: 164 U/L (ref 98–192)

## 2019-01-03 MED ORDER — TRAMADOL HCL 50 MG PO TABS: 100.0000 mg | ORAL_TABLET | Freq: Two times a day (BID) | ORAL | 0 refills | Status: DC

## 2019-01-03 MED ORDER — SODIUM CHLORIDE 0.9 % IV SOLN
Freq: Once | INTRAVENOUS | Status: AC
  Administered 2019-01-03: 13:00:00 via INTRAVENOUS
  Filled 2019-01-03: qty 250

## 2019-01-03 MED ORDER — SODIUM CHLORIDE 0.9% FLUSH
10.0000 mL | INTRAVENOUS | Status: DC | PRN
  Administered 2019-01-03: 10 mL
  Filled 2019-01-03: qty 10

## 2019-01-03 MED ORDER — SODIUM CHLORIDE 0.9% FLUSH
10.0000 mL | Freq: Once | INTRAVENOUS | Status: AC
  Administered 2019-01-03: 10 mL
  Filled 2019-01-03: qty 10

## 2019-01-03 MED ORDER — HEPARIN SOD (PORK) LOCK FLUSH 100 UNIT/ML IV SOLN
500.0000 [IU] | Freq: Once | INTRAVENOUS | Status: AC | PRN
  Administered 2019-01-03: 500 [IU]
  Filled 2019-01-03: qty 5

## 2019-01-03 MED ORDER — SODIUM CHLORIDE 0.9 % IV SOLN
200.0000 mg | Freq: Once | INTRAVENOUS | Status: AC
  Administered 2019-01-03: 200 mg via INTRAVENOUS
  Filled 2019-01-03: qty 8

## 2019-01-03 MED ORDER — ALTEPLASE 2 MG IJ SOLR
2.0000 mg | Freq: Once | INTRAMUSCULAR | Status: DC | PRN
  Filled 2019-01-03: qty 2

## 2019-01-03 NOTE — Patient Instructions (Signed)
[facility] Discharge Instructions for Patients Receiving Chemotherapy  Today you received the following chemotherapy agents :  Keytruda.  To help prevent nausea and vomiting after your treatment, we encourage you to take your nausea medication as prescribed.   If you develop nausea and vomiting that is not controlled by your nausea medication, call the clinic.   BELOW ARE SYMPTOMS THAT SHOULD BE REPORTED IMMEDIATELY:  *FEVER GREATER THAN 100.5 F  *CHILLS WITH OR WITHOUT FEVER  NAUSEA AND VOMITING THAT IS NOT CONTROLLED WITH YOUR NAUSEA MEDICATION  *UNUSUAL SHORTNESS OF BREATH  *UNUSUAL BRUISING OR BLEEDING  TENDERNESS IN MOUTH AND THROAT WITH OR WITHOUT PRESENCE OF ULCERS  *URINARY PROBLEMS  *BOWEL PROBLEMS  UNUSUAL RASH Items with * indicate a potential emergency and should be followed up as soon as possible.  Feel free to call the clinic should you have any questions or concerns. The clinic phone number is [phone].  Please show the CHEMO ALERT CARD at check-in to the Emergency Department and triage nurse.  

## 2019-01-03 NOTE — Patient Instructions (Signed)

## 2019-01-03 NOTE — Progress Notes (Signed)
Hematology and Oncology Follow Up Visit  Shannon Scott [id number] December 27, 1940 78 y.o. 01/03/2019   Principle Diagnosis:  Locally advanced/recurrent endometrial carcinoma undifferentiated -- TMB (HIGH) / MSI HIGH  Past Therapy: Radiation therapyfor 5 -5 1/2 weeks - s/p 20 fractions Taxol/carboplatinumq 7 days -- s/p cycle 6  Current Therapy:   Pembrolizumab 400 mg IV q 6 wk -- start on 01/23/2019 -- s/p cycle #9 of 200 mg q 3 week Zometa 4 mg IV q 3 months -- next dose in 04/2019 IV iron as indicated - last received 11/10/2017   Interim History:  Shannon Scott is here today for follow-up and treatment.  She also looks quite good.  Thankfully, her back is doing better.  She feels better.  She gets another epidural injection in a week.  She has had problems with the colostomy.  She looks like there is a very herniated colostomy site.  She is seeing general surgery for this.  She really would like to avoid surgery if necessary.  So far, the colostomy is functioning normally.   Her CA-125 is nice and stable.  We will recheck him back in May, it was 10.9.  She has had no problems with cough.  She has had no leg swelling.  She has had no rashes.  There is been no headache.  She had her last PET scan back in May.  She will be due for another PET scan in August.  Currently, her performance status is ECOG 1.    Medications:  Allergies as of 01/03/2019      Reactions   Statins Other (See Comments)   Myalgias and memory problems   Ciprofloxacin Itching   Splotchy redness with itching during IV infusion localized to arm.      Medication List       Accurate as of January 03, 2019  1:00 PM. If you have any questions, ask your nurse or doctor.        amoxicillin 500 MG tablet Commonly known as: AMOXIL Take four tablets prior to dental procedure   diclofenac sodium 1 % Gel Commonly known as: VOLTAREN Apply 2 g topically 4 (four) times daily.   diltiazem 300 MG 24 hr capsule  Commonly known as: TIAZAC TAKE 1 CAPSULE(300 MG) BY MOUTH DAILY What changed: See the new instructions.   gabapentin 100 MG capsule Commonly known as: Neurontin Take 1 capsule (100 mg total) by mouth 2 (two) times daily. What changed: Another medication with the same name was removed. Continue taking this medication, and follow the directions you see here. Changed by: Volanda Napoleon, MD   ibuprofen 200 MG tablet Commonly known as: ADVIL Take 200 mg by mouth daily as needed (back pain).   lactose free nutrition Liqd Take 237 mLs by mouth 3 (three) times daily with meals.   levothyroxine 50 MCG tablet Commonly known as: SYNTHROID Take 1 tablet (50 mcg total) by mouth daily before breakfast.   lidocaine-prilocaine cream Commonly known as: EMLA Apply 1 application topically as needed.   lipase/protease/amylase 36000 UNITS Cpep capsule Commonly known as: Creon Take 2 capsules (72,000 Units total) by mouth 3 (three) times daily before meals.   ondansetron 8 MG tablet Commonly known as: ZOFRAN TAKE 1 TABLET(8 MG) BY MOUTH EVERY 8 HOURS AS NEEDED FOR NAUSEA OR VOMITING   pantoprazole 40 MG tablet Commonly known as: PROTONIX Take 1 tablet (40 mg total) by mouth daily. What changed:   when to take this  reasons to  take this   polyethylene glycol 17 g packet Commonly known as: MIRALAX / GLYCOLAX Take 17 g by mouth daily.   prochlorperazine 10 MG tablet Commonly known as: COMPAZINE Take 1 tablet (10 mg total) by mouth every 6 (six) hours as needed for nausea or vomiting.   senna-docusate 8.6-50 MG tablet Commonly known as: Senokot-S Take 1 tablet by mouth 2 (two) times daily.   traMADol 50 MG tablet Commonly known as: ULTRAM Take 2 tablets (100 mg total) by mouth every 6 (six) hours as needed.       Allergies:  Allergies  Allergen Reactions  . Statins Other (See Comments)    Myalgias and memory problems  . Ciprofloxacin Itching    Splotchy redness with itching  during IV infusion localized to arm.    Past Medical History, Surgical history, Social history, and Family History were reviewed and updated.  Review of Systems: Review of Systems  Constitutional: Negative.   HENT: Negative.   Eyes: Negative.   Respiratory: Negative.   Cardiovascular: Negative.   Gastrointestinal: Negative.   Genitourinary: Negative.   Musculoskeletal: Positive for back pain.  Skin: Negative.   Neurological: Negative.   Endo/Heme/Allergies: Negative.   Psychiatric/Behavioral: Negative.      Physical Exam:  height is 5' 1"  (1.549 m) and weight is 136 lb 12.8 oz (62.1 kg). Her temporal temperature is 97.8 F (36.6 C). Her blood pressure is 158/60 (abnormal) and her pulse is 72. Her respiration is 18 and oxygen saturation is 99%.   Wt Readings from Last 3 Encounters:  01/03/19 136 lb 12.8 oz (62.1 kg)  11/21/18 138 lb (62.6 kg)  11/10/18 138 lb (62.6 kg)    Physical Exam Vitals signs reviewed.  HENT:     Head: Normocephalic and atraumatic.  Eyes:     Pupils: Pupils are equal, round, and reactive to light.  Neck:     Musculoskeletal: Normal range of motion.  Cardiovascular:     Rate and Rhythm: Normal rate and regular rhythm.     Heart sounds: Normal heart sounds.  Pulmonary:     Effort: Pulmonary effort is normal.     Breath sounds: Normal breath sounds.  Abdominal:     General: Bowel sounds are normal.     Palpations: Abdomen is soft.     Comments: Her abdominal exam shows a soft abdomen.  Her colostomy is in the center of her lower abdomen.  She has a relatively large hernia associated with the colostomy.  She has output from the colostomy.  There is no fluid wave.  There is no palpable abdominal mass.  There is no palpable liver or spleen tip.  Musculoskeletal: Normal range of motion.        General: No tenderness or deformity.  Lymphadenopathy:     Cervical: No cervical adenopathy.  Skin:    General: Skin is warm and dry.     Findings: No  erythema or rash.  Neurological:     Mental Status: She is alert and oriented to person, place, and time.  Psychiatric:        Behavior: Behavior normal.        Thought Content: Thought content normal.        Judgment: Judgment normal.      Lab Results  Component Value Date   WBC 5.0 01/03/2019   HGB 12.3 01/03/2019   HCT 37.2 01/03/2019   MCV 95.1 01/03/2019   PLT 236 01/03/2019   Lab Results  Component Value  Date   FERRITIN 501 (H) 11/21/2018   IRON 53 11/21/2018   TIBC 214 (L) 11/21/2018   UIBC 161 11/21/2018   IRONPCTSAT 25 11/21/2018   Lab Results  Component Value Date   RETICCTPCT 0.5 (L) 10/20/2017   RBC 3.91 01/03/2019   No results found for: KPAFRELGTCHN, LAMBDASER, KAPLAMBRATIO No results found for: IGGSERUM, IGA, IGMSERUM No results found for: Kathrynn Ducking, MSPIKE, SPEI   Chemistry      Component Value Date/Time   NA 140 01/03/2019 1200   NA 136 (A) 09/08/2017   K 3.7 01/03/2019 1200   CL 104 01/03/2019 1200   CO2 27 01/03/2019 1200   BUN 15 01/03/2019 1200   BUN 8 09/08/2017   CREATININE 0.66 01/03/2019 1200   CREATININE 0.69 08/02/2016 1519   GLU 111 09/08/2017      Component Value Date/Time   CALCIUM 9.1 01/03/2019 1200   ALKPHOS 65 01/03/2019 1200   AST 13 (L) 01/03/2019 1200   ALT 7 01/03/2019 1200   BILITOT 0.4 01/03/2019 1200       Impression and Plan: Ms. Sane is a very pleasant 78 yo caucasian female withrecurrent undifferentiated endometrial carcinoma.  I am just very impressed with how well she has done.  Immunotherapy is working very well for her.  I am not surprised by this given the fact that she has a high MSI and unstable MMR.  We will get another PET scan on her.  This will be done in 2 weeks.  I would like to switch her treatments to every 6 weeks now.  Unfortunately, we do not have the 6-week dose in our office today.  As such, she will have to come back in 3 weeks for  her next dose.  She will get her Zometa today.  I am just glad that her back is doing better and that her quality of life is improving.    Volanda Napoleon, MD 8/5/20201:00 PM

## 2019-01-04 LAB — IRON AND TIBC
Iron: 59 ug/dL (ref 41–142)
Saturation Ratios: 28 % (ref 21–57)
TIBC: 211 ug/dL — ABNORMAL LOW (ref 236–444)
UIBC: 153 ug/dL (ref 120–384)

## 2019-01-04 LAB — TSH: TSH: 0.751 u[IU]/mL (ref 0.308–3.960)

## 2019-01-04 LAB — FERRITIN: Ferritin: 501 ng/mL — ABNORMAL HIGH (ref 11–307)

## 2019-01-09 ENCOUNTER — Other Ambulatory Visit: Payer: Medicare Other

## 2019-01-23 ENCOUNTER — Inpatient Hospital Stay (HOSPITAL_BASED_OUTPATIENT_CLINIC_OR_DEPARTMENT_OTHER): Payer: Medicare Other | Admitting: Hematology & Oncology

## 2019-01-23 ENCOUNTER — Inpatient Hospital Stay: Payer: Medicare Other

## 2019-01-23 ENCOUNTER — Other Ambulatory Visit: Payer: Self-pay

## 2019-01-23 VITALS — BP 168/67 | HR 64 | Temp 97.8°F | Resp 17 | Wt 134.0 lb

## 2019-01-23 DIAGNOSIS — C539 Malignant neoplasm of cervix uteri, unspecified: Secondary | ICD-10-CM

## 2019-01-23 DIAGNOSIS — C541 Malignant neoplasm of endometrium: Secondary | ICD-10-CM | POA: Diagnosis not present

## 2019-01-23 DIAGNOSIS — C799 Secondary malignant neoplasm of unspecified site: Secondary | ICD-10-CM | POA: Diagnosis not present

## 2019-01-23 LAB — CMP (CANCER CENTER ONLY)
ALT: 13 U/L (ref 0–44)
AST: 13 U/L — ABNORMAL LOW (ref 15–41)
Albumin: 4 g/dL (ref 3.5–5.0)
Alkaline Phosphatase: 65 U/L (ref 38–126)
Anion gap: 9 (ref 5–15)
BUN: 18 mg/dL (ref 8–23)
CO2: 25 mmol/L (ref 22–32)
Calcium: 9.2 mg/dL (ref 8.9–10.3)
Chloride: 104 mmol/L (ref 98–111)
Creatinine: 0.67 mg/dL (ref 0.44–1.00)
GFR, Est AFR Am: >60 mL/min (ref >60)
GFR, Estimated: >60 mL/min (ref >60)
Glucose, Bld: 88 mg/dL (ref 70–99)
Potassium: 3.8 mmol/L (ref 3.5–5.1)
Sodium: 138 mmol/L (ref 135–145)
Total Bilirubin: 0.7 mg/dL (ref 0.3–1.2)
Total Protein: 6.9 g/dL (ref 6.5–8.1)

## 2019-01-23 LAB — CBC WITH DIFFERENTIAL (CANCER CENTER ONLY)
Abs Immature Granulocytes: 0.05 10*3/uL (ref 0.00–0.07)
Basophils Absolute: 0 10*3/uL (ref 0.0–0.1)
Basophils Relative: 0 %
Eosinophils Absolute: 0.1 10*3/uL (ref 0.0–0.5)
Eosinophils Relative: 1 %
HCT: 37.1 % (ref 36.0–46.0)
Hemoglobin: 12.1 g/dL (ref 12.0–15.0)
Immature Granulocytes: 1 %
Lymphocytes Relative: 7 %
Lymphs Abs: 0.6 10*3/uL — ABNORMAL LOW (ref 0.7–4.0)
MCH: 31.3 pg (ref 26.0–34.0)
MCHC: 32.6 g/dL (ref 30.0–36.0)
MCV: 95.9 fL (ref 80.0–100.0)
Monocytes Absolute: 0.5 10*3/uL (ref 0.1–1.0)
Monocytes Relative: 7 %
Neutro Abs: 6.6 10*3/uL (ref 1.7–7.7)
Neutrophils Relative %: 84 %
Platelet Count: 240 10*3/uL (ref 150–400)
RBC: 3.87 MIL/uL (ref 3.87–5.11)
RDW: 13.2 % (ref 11.5–15.5)
WBC Count: 7.8 10*3/uL (ref 4.0–10.5)
nRBC: 0 % (ref 0.0–0.2)

## 2019-01-23 MED ORDER — SODIUM CHLORIDE 0.9 % IV SOLN
Freq: Once | INTRAVENOUS | Status: AC
  Administered 2019-01-23: 13:00:00 via INTRAVENOUS
  Filled 2019-01-23: qty 250

## 2019-01-23 MED ORDER — SODIUM CHLORIDE 0.9 % IV SOLN
400.0000 mg | Freq: Once | INTRAVENOUS | Status: AC
  Administered 2019-01-23: 400 mg via INTRAVENOUS
  Filled 2019-01-23: qty 4

## 2019-01-23 MED ORDER — HEPARIN SOD (PORK) LOCK FLUSH 100 UNIT/ML IV SOLN
500.0000 [IU] | Freq: Once | INTRAVENOUS | Status: AC | PRN
  Administered 2019-01-23: 500 [IU]
  Filled 2019-01-23: qty 5

## 2019-01-23 MED ORDER — SODIUM CHLORIDE 0.9% FLUSH
10.0000 mL | INTRAVENOUS | Status: DC | PRN
  Administered 2019-01-23: 10 mL
  Filled 2019-01-23: qty 10

## 2019-01-23 NOTE — Progress Notes (Signed)
Hematology and Oncology Follow Up Visit  Shannon Scott [id number] 10-08-1940 78 y.o. 01/23/2019   Principle Diagnosis:  Locally advanced/recurrent endometrial carcinoma undifferentiated -- TMB (HIGH) / MSI HIGH  Past Therapy: Radiation therapyfor 5 -5 1/2 weeks - s/p 20 fractions Taxol/carboplatinumq 7 days -- s/p cycle 6  Current Therapy:   Pembrolizumab 400 mg IV q 6 wk -- start on 01/23/2019 -- s/p cycle #10 of 200 mg q 3 week Zometa 4 mg IV q 3 months -- next dose in 04/2019 IV iron as indicated - last received 11/10/2017   Interim History:  Shannon Scott is here today for follow-up and treatment.  She also looks quite good.  Thankfully, Shannon Scott back is doing better.  She had an epidural injection which she says has worked quite nicely.  Unfortunately, Shannon Scott insurance company would not let us do a PET scan on Shannon Scott.  As such, we are just treating Shannon Scott for right now.  Since she is doing well, I really do not think we have to put Shannon Scott through the scans.  The problem with scans is that she has a lot of scar tissue so it is hard to know what his actual tumor and what is scar tissue.  She is still is thinking about having the colostomy reversed.  She feels good right now so she does not want to have to undergo surgery.  She has had no problems with cough or shortness of breath.  She has had no bleeding.  She has a colostomy that seems to be working nicely.  There is no issues with fever.  Patient had a little bit of leg swelling but not all that bad.  Overall, I would have to say that Shannon Scott performance status is ECOG 1.     Medications:  Allergies as of 01/23/2019      Reactions   Statins Other (See Comments)   Myalgias and memory problems   Ciprofloxacin Itching   Splotchy redness with itching during IV infusion localized to arm.      Medication List       Accurate as of January 23, 2019 12:17 PM. If you have any questions, ask your nurse or doctor.        amoxicillin 500  MG tablet Commonly known as: AMOXIL Take four tablets prior to dental procedure   diclofenac sodium 1 % Gel Commonly known as: VOLTAREN Apply 2 g topically 4 (four) times daily.   diltiazem 300 MG 24 hr capsule Commonly known as: TIAZAC TAKE 1 CAPSULE(300 MG) BY MOUTH DAILY What changed: See the new instructions.   gabapentin 100 MG capsule Commonly known as: Neurontin Take 1 capsule (100 mg total) by mouth 2 (two) times daily.   ibuprofen 200 MG tablet Commonly known as: ADVIL Take 200 mg by mouth daily as needed (back pain).   lactose free nutrition Liqd Take 237 mLs by mouth 3 (three) times daily with meals.   levothyroxine 50 MCG tablet Commonly known as: SYNTHROID Take 1 tablet (50 mcg total) by mouth daily before breakfast.   lidocaine-prilocaine cream Commonly known as: EMLA Apply 1 application topically as needed.   lipase/protease/amylase 36000 UNITS Cpep capsule Commonly known as: Creon Take 2 capsules (72,000 Units total) by mouth 3 (three) times daily before meals.   ondansetron 8 MG tablet Commonly known as: ZOFRAN TAKE 1 TABLET(8 MG) BY MOUTH EVERY 8 HOURS AS NEEDED FOR NAUSEA OR VOMITING   pantoprazole 40 MG tablet Commonly known as: PROTONIX Take 1  tablet (40 mg total) by mouth daily. What changed:   when to take this  reasons to take this   polyethylene glycol 17 g packet Commonly known as: MIRALAX / GLYCOLAX Take 17 g by mouth daily.   prochlorperazine 10 MG tablet Commonly known as: COMPAZINE Take 1 tablet (10 mg total) by mouth every 6 (six) hours as needed for nausea or vomiting.   senna-docusate 8.6-50 MG tablet Commonly known as: Senokot-S Take 1 tablet by mouth 2 (two) times daily.   traMADol 50 MG tablet Commonly known as: ULTRAM Take 2 tablets (100 mg total) by mouth 2 (two) times daily.       Allergies:  Allergies  Allergen Reactions  . Statins Other (See Comments)    Myalgias and memory problems  . Ciprofloxacin  Itching    Splotchy redness with itching during IV infusion localized to arm.    Past Medical History, Surgical history, Social history, and Family History were reviewed and updated.  Review of Systems: Review of Systems  Constitutional: Negative.   HENT: Negative.   Eyes: Negative.   Respiratory: Negative.   Cardiovascular: Negative.   Gastrointestinal: Negative.   Genitourinary: Negative.   Musculoskeletal: Positive for back pain.  Skin: Negative.   Neurological: Negative.   Endo/Heme/Allergies: Negative.   Psychiatric/Behavioral: Negative.      Physical Exam:  weight is 134 lb (60.8 kg). Shannon Scott temporal temperature is 97.8 F (36.6 C). Shannon Scott blood pressure is 168/67 (abnormal) and Shannon Scott pulse is 64. Shannon Scott respiration is 17.   Wt Readings from Last 3 Encounters:  01/23/19 134 lb (60.8 kg)  01/03/19 136 lb 12.8 oz (62.1 kg)  11/21/18 138 lb (62.6 kg)    Physical Exam Vitals signs reviewed.  HENT:     Head: Normocephalic and atraumatic.  Eyes:     Pupils: Pupils are equal, round, and reactive to light.  Neck:     Musculoskeletal: Normal range of motion.  Cardiovascular:     Rate and Rhythm: Normal rate and regular rhythm.     Heart sounds: Normal heart sounds.  Pulmonary:     Effort: Pulmonary effort is normal.     Breath sounds: Normal breath sounds.  Abdominal:     General: Bowel sounds are normal.     Palpations: Abdomen is soft.     Comments: Shannon Scott abdominal exam shows a soft abdomen.  Shannon Scott colostomy is in the center of Shannon Scott lower abdomen.  She has a relatively large hernia associated with the colostomy.  She has output from the colostomy.  There is no fluid wave.  There is no palpable abdominal mass.  There is no palpable liver or spleen tip.  Musculoskeletal: Normal range of motion.        General: No tenderness or deformity.  Lymphadenopathy:     Cervical: No cervical adenopathy.  Skin:    General: Skin is warm and dry.     Findings: No erythema or rash.   Neurological:     Mental Status: She is alert and oriented to person, place, and time.  Psychiatric:        Behavior: Behavior normal.        Thought Content: Thought content normal.        Judgment: Judgment normal.      Lab Results  Component Value Date   WBC 7.8 01/23/2019   HGB 12.1 01/23/2019   HCT 37.1 01/23/2019   MCV 95.9 01/23/2019   PLT 240 01/23/2019   Lab Results  Component Value Date   FERRITIN 501 (H) 01/03/2019   IRON 59 01/03/2019   TIBC 211 (L) 01/03/2019   UIBC 153 01/03/2019   IRONPCTSAT 28 01/03/2019   Lab Results  Component Value Date   RETICCTPCT 0.5 (L) 10/20/2017   RBC 3.87 01/23/2019   No results found for: KPAFRELGTCHN, LAMBDASER, KAPLAMBRATIO No results found for: IGGSERUM, IGA, IGMSERUM No results found for: Kathrynn Ducking, MSPIKE, SPEI   Chemistry      Component Value Date/Time   NA 140 01/03/2019 1200   NA 136 (A) 09/08/2017   K 3.7 01/03/2019 1200   CL 104 01/03/2019 1200   CO2 27 01/03/2019 1200   BUN 15 01/03/2019 1200   BUN 8 09/08/2017   CREATININE 0.66 01/03/2019 1200   CREATININE 0.69 08/02/2016 1519   GLU 111 09/08/2017      Component Value Date/Time   CALCIUM 9.1 01/03/2019 1200   ALKPHOS 65 01/03/2019 1200   AST 13 (L) 01/03/2019 1200   ALT 7 01/03/2019 1200   BILITOT 0.4 01/03/2019 1200       Impression and Plan: Shannon Scott is a very pleasant 78 yo caucasian female withrecurrent undifferentiated endometrial carcinoma.  I am just very impressed with how well she has done.  Immunotherapy is working very well for Shannon Scott.  I am not surprised by this given the fact that she has a high MSI and unstable MMR.  I would like to switch Shannon Scott treatments to every 6 weeks now.    I am just glad that Shannon Scott back is doing better and that Shannon Scott quality of life is improving.  We will see Shannon Scott back in 6 weeks.    Volanda Napoleon, MD 8/25/202012:17 PM

## 2019-01-23 NOTE — Patient Instructions (Signed)
Pembrolizumab injection What is this medicine? PEMBROLIZUMAB (pem broe liz ue mab) is a monoclonal antibody. It is used to treat bladder cancer, cervical cancer, endometrial cancer, esophageal cancer, head and neck cancer, hepatocellular cancer, Hodgkin lymphoma, kidney cancer, lymphoma, melanoma, Merkel cell carcinoma, lung cancer, stomach cancer, urothelial cancer, and cancers that have a certain genetic condition. This medicine may be used for other purposes; ask your health care provider or pharmacist if you have questions. COMMON BRAND NAME(S): Keytruda What should I tell my health care provider before I take this medicine? They need to know if you have any of these conditions:  diabetes  immune system problems  inflammatory bowel disease  liver disease  lung or breathing disease  lupus  received or scheduled to receive an organ transplant or a stem-cell transplant that uses donor stem cells  an unusual or allergic reaction to pembrolizumab, other medicines, foods, dyes, or preservatives  pregnant or trying to get pregnant  breast-feeding How should I use this medicine? This medicine is for infusion into a vein. It is given by a health care professional in a hospital or clinic setting. A special MedGuide will be given to you before each treatment. Be sure to read this information carefully each time. Talk to your pediatrician regarding the use of this medicine in children. While this drug may be prescribed for selected conditions, precautions do apply. Overdosage: If you think you have taken too much of this medicine contact a poison control center or emergency room at once. NOTE: This medicine is only for you. Do not share this medicine with others. What if I miss a dose? It is important not to miss your dose. Call your doctor or health care professional if you are unable to keep an appointment. What may interact with this medicine? Interactions have not been studied. Give  your health care provider a list of all the medicines, herbs, non-prescription drugs, or dietary supplements you use. Also tell them if you smoke, drink alcohol, or use illegal drugs. Some items may interact with your medicine. This list may not describe all possible interactions. Give your health care provider a list of all the medicines, herbs, non-prescription drugs, or dietary supplements you use. Also tell them if you smoke, drink alcohol, or use illegal drugs. Some items may interact with your medicine. What should I watch for while using this medicine? Your condition will be monitored carefully while you are receiving this medicine. You may need blood work done while you are taking this medicine. Do not become pregnant while taking this medicine or for 4 months after stopping it. Women should inform their doctor if they wish to become pregnant or think they might be pregnant. There is a potential for serious side effects to an unborn child. Talk to your health care professional or pharmacist for more information. Do not breast-feed an infant while taking this medicine or for 4 months after the last dose. What side effects may I notice from receiving this medicine? Side effects that you should report to your doctor or health care professional as soon as possible:  allergic reactions like skin rash, itching or hives, swelling of the face, lips, or tongue  bloody or black, tarry  breathing problems  changes in vision  chest pain  chills  confusion  constipation  cough  diarrhea  dizziness or feeling faint or lightheaded  fast or irregular heartbeat  fever  flushing  hair loss  joint pain  low blood counts - this   medicine may decrease the number of white blood cells, red blood cells and platelets. You may be at increased risk for infections and bleeding.  muscle pain  muscle weakness  persistent headache  redness, blistering, peeling or loosening of the skin,  including inside the mouth  signs and symptoms of high blood sugar such as dizziness; dry mouth; dry skin; fruity breath; nausea; stomach pain; increased hunger or thirst; increased urination  signs and symptoms of kidney injury like trouble passing urine or change in the amount of urine  signs and symptoms of liver injury like dark urine, light-colored stools, loss of appetite, nausea, right upper belly pain, yellowing of the eyes or skin  sweating  swollen lymph nodes  weight loss Side effects that usually do not require medical attention (report to your doctor or health care professional if they continue or are bothersome):  decreased appetite  muscle pain  tiredness This list may not describe all possible side effects. Call your doctor for medical advice about side effects. You may report side effects to FDA at 1-800-FDA-1088. Where should I keep my medicine? This drug is given in a hospital or clinic and will not be stored at home. NOTE: This sheet is a summary. It may not cover all possible information. If you have questions about this medicine, talk to your doctor, pharmacist, or health care provider.  2020 [employer]/Gold Standard (2018-06-13 13:46:58)  

## 2019-01-23 NOTE — Patient Instructions (Signed)

## 2019-01-24 LAB — CA 125: Cancer Antigen (CA) 125: 9.2 U/mL (ref 0.0–38.1)

## 2019-02-01 ENCOUNTER — Other Ambulatory Visit: Payer: Self-pay | Admitting: Internal Medicine

## 2019-02-07 NOTE — Progress Notes (Signed)
GYN ONCOLOGY OFFICE VISIT    Shannon Scott 78 y.o. female  CC: Recurrent endometrial cancer,  Assessment/Plan:  [name]  is a 78 y.o.  year old with Stage IA grade 2 endometrioid adenocarcinoma.  Status post surgical staging  12/30/2015. She only had 30% depth of myometrial invasion, no lymphovascular space involvement, and low risk factors for recurrence therefore adjuvant therapy not recommended in accordance to NCCN guidelines.   In March 2019 she presented with a bowel perforation and distal rectosigmoid obstruction. She underwent a transverse diverting colostomy and subsequent placement of ureteral stent.  She completed external beam radiotherapy in addition to Taxol and carboplatin for 6 cycles.    Recurrence to the left pelvic sidewall was identified 12/2017.  Molecular assessment of the tumor indicates that this is an MSI high tumor that is ER positive.  She has completed 7 cycles of immunotherapy pembrolizumab. Imaging 09/2018 notable for response in the size of the left pelvic sidewall mass with no new areas of uptake  PET imaging 01/7025 c/w low metabolic activity.  Insurance has declined repeat imaging.  Stable and good QOL at present. Rec continue pembro treatment. (Lenvatinib may also be added to the regimen.)     Low back pain CT 09/2018 with sacral insufficiency fractures which may be the etiology of the discomfort Pain managed with steroid injections.  Follow-up with Gyn Oncology in 4 months.    HPI: Oncology History Overview Note  The patient began noticing postmenopausal bleeding in June, 2017. She saw her primary care physician who determined there was no blood in the urine, and she was then referred to Dr Shannon Scott who, on 11/21/15 performed a TVUS which showed a uterus measuring 6x2.7x3.3cm with normal ovaries. There was a thickened endometrium of 6.30m.    Endometrial cancer ([facility]  11/21/2015 Initial Diagnosis   Endometrial cancer ([facility]ent A pipelle  endometrial biopsy was performed on 11/21/15 which showed FIGO grade 1-2 endometrial cancer.  The pap from the same date was normal.   12/30/2015 Surgery   Robotic assisted total hysterectomy, BSO, pelvic and PA SLN biopsy. 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  Postoperative Diagnosis: same.  Surgery: Total robotic hysterectomy bilateral salpingo-oophorectomy, left pelvic, right para-aortic and bifurcation SLN removal  Operative findings:  1) Colon adherent to posterior uterus, left sidewall and appendix 2) 2 right para-aortic nodes for SLN (high and PA node) 3) Aortic bifurcation nodes 4) Left obturator node  Diagnosis 1. Lymph node, sentinel, biopsy, right obturator - ONE BENIGN LYMPH NODE (0/1). 2. Lymph node, sentinel, biopsy, right peri-aortic - ONE BENIGN LYMPH NODE (0/1). 3. Lymph node, sentinel, biopsy, right lower peri-aortic - ONE BENIGN LYMPH NODE (0/1). 4. Lymph node, sentinel, biopsy, aortic bifurcation - ONE BENIGN LYMPH NODE (0/1). 5. Lymph node, sentinel, biopsy, left obturator - ONE BENIGN LYMPH NODE (0/1). 6. Uterus +/- tubes/ovaries, neoplastic - ENDOMETRIAL ADENOCARCINOMA, 1.7 CM WITH SUPERFICIAL MYOMETRIAL INVASION. - MARGINS NOT INVOLVED. - CERVIX, BILATERAL OVARIES AND BILATERAL FALLOPIAN TUBES FREE OF TUMOR. Microscopic Comment 6. ONCOLOGY TABLE-UTERUS, CARCINOMA OR CARCINOSARCOMA Specimen: Uterus with bilateral fallopian tubes and ovaries and sentinel lymph node biopsies. Procedure: Hysterectomy with sentinel lymph nodes. Lymph node sampling performed: Yes. Specimen integrity: Intact. Maximum tumor size: 1.7 cm Histologic type: Endometrioid with squamous differentiation. Grade: 2 Myometrial invasion: 0.3 cm where myometrium is 1 cm in thickness Cervical stromal involvement: No. Extent of involvement of other organs: None, identified. Lymph - vascular invasion: Not identified.  05/13/2016 Imaging   05/03/16 CT  guided drainage: IMPRESSION: Successful CT guided aspiration of approximately 15 cc of serous, slightly cloudy / milky fluid from the residual collection within the left hemipelvis. All aspirated samples were sent to the laboratory for cytologic and gram stain analysis.  Cytology was negative   07/2017 Progression   Shannon Scott in February 2019 presented to her primary physician with complaints of rectal bleeding.  She was scheduled for a colonoscopy and had taken the bowel prep when she presented with obstruction of her distal sigmoid colon.   08/18/2017 Imaging   maging 08/18/2017 The left ureter is dilated to the left hemipelvis. It becomes narrowed in the inflammatory pelvic mass. This is described below. Bladder is within normal limits.   Stomach/Bowel: Large hiatal hernia is unchanged. Stable prominent gastric folds within the hiatal hernia.   The colon is diffusely distended. Distal small bowel is distended. A transition point between dilated large bowel and decompressed large bowel occurs in the sigmoid colon. There is a heterogeneous ill-defined mass involving the sigmoid colon and left side of the pelvis which includes the colon wall, adjacent fat, and both fluid and gas elements. Findings are suspicious for a focal perforation with abscess formation. Underlying malignancy is a strong consideration given the appearance and colon obstruction. The mass also involves the left ureter causing an element of left ureteral dilatation worrisome for an element of left ureteral obstruction. The gas and fluid collection is very small measuring 1.8 x 1.2 cm on  image 109 of series 4. Overall mass size including the involved colon is 5.7 x 5.3 cm.    08/21/2017 Surgery   Shannon Scott underwent a diverting laparoscopic transverse loop colostomy and subsequent placement of a left ureteral stent.   08/2017 -  Radiation Therapy   She then received pelvic external beam radiation therapy     10/12/2017 - 11/23/2017 Chemotherapy   The patient had palonosetron (ALOXI) injection 0.25 mg, 0.25 mg, Intravenous,  Once, 6 of 6 cycles Administration: 0.25 mg (10/13/2017), 0.25 mg (10/20/2017), 0.25 mg (11/03/2017), 0.25 mg (11/10/2017), 0.25 mg (11/17/2017) CARBOplatin (PARAPLATIN) 150 mg in sodium chloride 0.9 % 100 mL chemo infusion, 150 mg (100 % of original dose 148 mg), Intravenous,  Once, 6 of 6 cycles Dose modification:   (original dose 148 mg, Cycle 1) Administration: 150 mg (10/13/2017), 150 mg (10/20/2017), 150 mg (11/03/2017), 150 mg (11/10/2017), 150 mg (11/17/2017) PACLitaxel (TAXOL) 132 mg in sodium chloride 0.9 % 250 mL chemo infusion (</= 19m/m2), 80 mg/m2 = 132 mg, Intravenous,  Once, 6 of 6 cycles Dose modification: 45 mg/m2 (original dose 80 mg/m2, Cycle 2, Reason: Other (see comments), Comment: Decreasing dose to 45 mg/m2 while undergoing XRT) Administration: 132 mg (10/13/2017), 72 mg (10/20/2017), 72 mg (11/03/2017), 72 mg (11/10/2017), 72 mg (11/17/2017)  for chemotherapy treatment.    01/18/2018 Imaging   notable for the presence of an IVC filter and 11 mm left internal iliac node that was previously 1.6 cm and a soft tissue mass in the left pelvic sidewall and now measuring 3.9 x 2.5 cm.   03/03/2018 Imaging   PET  demonstrates a hypermetabolic left pelvic sidewall mass, greater than 3 weeks after completion of radiotherapy.   03/16/2018 -  Chemotherapy   The patient had pembrolizumab (KEYTRUDA) 200 mg in sodium chloride 0.9 % 50 mL chemo infusion, 200 mg, Intravenous, Once, 11 of 13 cycles Dose modification: 400 mg (original dose 200 mg, Cycle 11, Reason: Other (  see comments), Comment: change to q 6 week) Administration: 200 mg (03/16/2018), 200 mg (04/06/2018), 200 mg (05/04/2018), 200 mg (05/25/2018), 200 mg (10/10/2018), 200 mg (06/15/2018), 200 mg (07/27/2018), 200 mg (10/31/2018), 200 mg (11/21/2018), 200 mg (01/03/2019), 400 mg (01/23/2019)  for chemotherapy treatment.    04/2018  Miscellaneous    CA 125  Ref. Range 05/04/2018 12:08 05/25/2018 11:21 06/15/2018 13:30 10/10/2018 12:20 01/23/2019 11:35  Cancer Antigen (CA) 125 Latest Ref Range: 0.0 - 38.1 U/mL 11.2 10.9 10.2 10.9 9.2     10/05/2018 Imaging   PET  3. Continued positive response to therapy within the left pelvic sidewall mass, which demonstrates residual low level hypermetabolism that has mildly decreased. 4. Resolved mediastinal nodal hypermetabolism. Nearly resolved bilateral hilar nodal hypermetabolism. These findings could represent resolving reactive nodes versus response to therapy within metastatic nodes. 5. No metabolic evidence of new sites of metastatic disease.       Review of Systems  Constitutional:  Denies fever. Skin: Fine macular nonblanching patches over the upper extremity  cardiovascular: No chest pain, shortness of breath, or edema  Pulmonary: No cough  Gastro Intestinal: Reporting increase in parastomal hernia, with infrequent feeling of fullness in the area.  No nausea, vomiting, constipation, or diarrhea reported.  Reports passage of gas from the rectum, Genitourinary: Denies vaginal bleeding and discharge.  Musculoskeletal: Right hip pain, unbearable, Planned steroid injection. Reports swelling of the left lower extremity Neurologic: No weakness    Allergy:  Allergies  Allergen Reactions  . Statins Other (See Comments)    Myalgias and memory problems  . Ciprofloxacin Itching    Splotchy redness with itching during IV infusion localized to arm.    Social Hx:   Social History   Socioeconomic History  . Marital status: Widowed    Spouse name: Not on file  . Number of children: 2  . Years of education: Not on file  . Highest education level: Not on file  Occupational History    Employer: RETIRED  Social Needs  . Financial resource strain: Not on file  . Food insecurity    Worry: Not on file    Inability: Not on file  . Transportation needs    Medical: Not on  file    Non-medical: Not on file  Tobacco Use  . Smoking status: Never Smoker  . Smokeless tobacco: Never Used  Substance and Sexual Activity  . Alcohol use: No  . Drug use: No  . Sexual activity: Not on file  Lifestyle  . Physical activity    Days per week: Not on file    Minutes per session: Not on file  . Stress: Not on file  Relationships  . Social Herbalist on phone: Not on file    Gets together: Not on file    Attends religious service: Not on file    Active member of club or organization: Not on file    Attends meetings of clubs or organizations: Not on file    Relationship status: Not on file  . Intimate partner violence    Fear of current or ex partner: Not on file    Emotionally abused: Not on file    Physically abused: Not on file    Forced sexual activity: Not on file  Other Topics Concern  . Not on file  Social History Narrative  . Not on file    Past Surgical Hx:  Past Surgical History:  Procedure Laterality Date  . ABDOMINAL HYSTERECTOMY    .  Carotid Doppler  02/2012   40-59% right int carotid artery stenosis; 60-79% L int carotid stenosis; L carotid bruit  . CORONARY ARTERY BYPASS GRAFT  03/12/2004   LIMA to LAD, SVG to circumflex, SVG to PDA  . CYSTOSCOPY W/ URETERAL STENT PLACEMENT Left 12/06/2017   Procedure: CYSTOSCOPY WITH LEFT URETERAL STENT EXCHANGE;  Surgeon: Irine Seal, MD;  Location: [facility];  Service: Urology;  Laterality: Left;  . CYSTOSCOPY WITH STENT PLACEMENT Bilateral 08/21/2017   Procedure: CYSTOSCOPY WITH STENT PLACEMENT;  Surgeon: Ceasar Mons, MD;  Location: [facility];  Service: Urology;  Laterality: Bilateral;  . IR FLUORO GUIDE PORT INSERTION RIGHT  10/11/2017  . IR GENERIC HISTORICAL  04/29/2016   IR RADIOLOGIST EVAL & MGMT 04/29/2016 Sandi Mariscal, MD [facility]  . IR GENERIC HISTORICAL  05/12/2016   IR RADIOLOGIST EVAL & MGMT 05/12/2016 Sandi Mariscal, MD [facility]  . IR IVC FILTER PLMT / S&I /IMG  GUID/MOD SED  10/11/2017  . IR US GUIDE VASC ACCESS RIGHT  10/11/2017  . ROBOTIC ASSISTED TOTAL HYSTERECTOMY WITH BILATERAL SALPINGO OOPHERECTOMY Bilateral 12/30/2015   Procedure: XI ROBOTIC ASSISTED TOTAL HYSTERECTOMY WITH BILATERAL SALPINGO OOPHORECTOMY WITH SENTAL LYMPH NODE BIOPSY;  Surgeon: Nancy Marus, MD;  Location: [facility];  Service: Gynecology;  Laterality: Bilateral;  . TONSILLECTOMY    . TRANSTHORACIC ECHOCARDIOGRAM  04/2007   EF>55%; mild MR; mild-mod TR; mild pulm HTN; mild calcification of aortiv valve leaflets with mild valvular aortic stenosis  . TUBAL LIGATION      Past Medical Hx:  Past Medical History:  Diagnosis Date  . Arthritis   . Asthma    allergy induced  . Bilateral carotid artery disease (HCC)    L carotid bruit  . Bursitis    left hip  . CAD (coronary artery disease)   . Cancer ([place])   . Dyslipidemia    intolerant to statins, welchol, niacin, zetia  . Goals of care, counseling/discussion 10/11/2017  . History of blood transfusion   . History of nuclear stress test 04/24/2012   lexiscan; normal study  . Hypertension   . Hypothyroidism   . 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 ([place]) 10/11/2017  . Postoperative nausea and vomiting 01/02/2016    Past Gynecological History:  See HPI  No LMP recorded. Patient has had a hysterectomy.  Family Hx:  Family History  Problem Relation Age of Onset  . Hypertension Mother   . Heart disease Mother        Died in her 80s  . Stroke Father   . Kidney disease Brother   . Heart disease Brother        also HTN, hyperlipidemia  . Heart attack Brother   . Stroke Sister        x2  . Hypertension Sister     Vitals:  Blood pressure (!) 160/62, pulse 64, temperature 99.1 F (37.3 C), temperature source Tympanic, resp. rate 16, height 5' 1"  (1.549 m), weight 134 lb (60.8 kg). Body mass index is 25.32 kg/m.   Physical Exam: WD in NAD CHEST:  CTA Abd.  Colostomy present with  air and stool.  Parastomal hernia present mildly tender, increase in size. No palpable masses or ascites Pelvic: Normal external genitalia Bartholin's urethra Skene's, No pelvic masses palpable.  Vagina is atrophic without any lesions rectal good tone no palpable masses. Erythema of the mons Lymph nodes no cervical supraclavicular or inguinal  adenopathy Extremities 3-2+ left lower extremity  edema extending to the knee Back: No CVA tenderness Musculoskeletal:  Mild right hip pain.

## 2019-02-08 ENCOUNTER — Inpatient Hospital Stay: Payer: Medicare Other

## 2019-02-08 ENCOUNTER — Other Ambulatory Visit: Payer: Self-pay

## 2019-02-08 ENCOUNTER — Inpatient Hospital Stay: Payer: Medicare Other | Attending: Hematology & Oncology | Admitting: Gynecologic Oncology

## 2019-02-08 VITALS — BP 160/62 | HR 64 | Temp 99.1°F | Resp 16 | Ht 61.0 in | Wt 134.0 lb

## 2019-02-08 DIAGNOSIS — E039 Hypothyroidism, unspecified: Secondary | ICD-10-CM | POA: Insufficient documentation

## 2019-02-08 DIAGNOSIS — M545 Low back pain: Secondary | ICD-10-CM | POA: Insufficient documentation

## 2019-02-08 DIAGNOSIS — I10 Essential (primary) hypertension: Secondary | ICD-10-CM | POA: Insufficient documentation

## 2019-02-08 DIAGNOSIS — Z951 Presence of aortocoronary bypass graft: Secondary | ICD-10-CM | POA: Insufficient documentation

## 2019-02-08 DIAGNOSIS — M1990 Unspecified osteoarthritis, unspecified site: Secondary | ICD-10-CM | POA: Insufficient documentation

## 2019-02-08 DIAGNOSIS — C541 Malignant neoplasm of endometrium: Secondary | ICD-10-CM | POA: Diagnosis not present

## 2019-02-08 DIAGNOSIS — J45909 Unspecified asthma, uncomplicated: Secondary | ICD-10-CM | POA: Diagnosis not present

## 2019-02-08 DIAGNOSIS — I2510 Atherosclerotic heart disease of native coronary artery without angina pectoris: Secondary | ICD-10-CM | POA: Diagnosis not present

## 2019-02-08 DIAGNOSIS — Z170 Estrogen receptor positive status [ER+]: Secondary | ICD-10-CM | POA: Insufficient documentation

## 2019-02-08 DIAGNOSIS — Z90710 Acquired absence of both cervix and uterus: Secondary | ICD-10-CM | POA: Diagnosis not present

## 2019-02-08 DIAGNOSIS — Z923 Personal history of irradiation: Secondary | ICD-10-CM | POA: Insufficient documentation

## 2019-02-08 DIAGNOSIS — E785 Hyperlipidemia, unspecified: Secondary | ICD-10-CM | POA: Diagnosis not present

## 2019-02-08 DIAGNOSIS — R300 Dysuria: Secondary | ICD-10-CM | POA: Diagnosis not present

## 2019-02-08 DIAGNOSIS — Z90722 Acquired absence of ovaries, bilateral: Secondary | ICD-10-CM | POA: Insufficient documentation

## 2019-02-08 DIAGNOSIS — G8929 Other chronic pain: Secondary | ICD-10-CM | POA: Diagnosis not present

## 2019-02-08 DIAGNOSIS — Z9221 Personal history of antineoplastic chemotherapy: Secondary | ICD-10-CM | POA: Diagnosis not present

## 2019-02-08 DIAGNOSIS — C7989 Secondary malignant neoplasm of other specified sites: Secondary | ICD-10-CM | POA: Diagnosis not present

## 2019-02-08 DIAGNOSIS — C539 Malignant neoplasm of cervix uteri, unspecified: Secondary | ICD-10-CM

## 2019-02-08 LAB — URINALYSIS, COMPLETE (UACMP) WITH MICROSCOPIC
Bacteria, UA: NONE SEEN
Bilirubin Urine: NEGATIVE
Glucose, UA: NEGATIVE mg/dL
Hgb urine dipstick: NEGATIVE
Ketones, ur: NEGATIVE mg/dL
Leukocytes,Ua: NEGATIVE
Nitrite: NEGATIVE
Protein, ur: NEGATIVE mg/dL
Specific Gravity, Urine: 1.019 (ref 1.005–1.030)
pH: 5 (ref 5.0–8.0)

## 2019-02-08 NOTE — Patient Instructions (Signed)
Follow up in 4 months   Thank you very much Ms. [name] for allowing me to provide care for you today.  I appreciate your confidence in choosing our Gynecologic Oncology team.  If you have any questions about your visit today please call our office and we will get back to you as soon as possible.  Please consider using the website Medlineplus.gov as an Geneticist, molecular.   Francetta Found. Aztlan Coll MD., PhD Gynecologic Oncology\

## 2019-02-08 NOTE — Addendum Note (Signed)
Addended by: Baruch Merl on: 02/08/2019 01:02 PM   Modules accepted: Orders

## 2019-02-09 ENCOUNTER — Telehealth: Payer: Self-pay

## 2019-02-09 LAB — URINE CULTURE: Culture: 10000 — AB

## 2019-02-09 NOTE — Telephone Encounter (Signed)
Told Shannon Scott that there is not enough bacteria in the culture to be considered a UTI.  [name], NP said if her symptoms persist, she should see her PCP for further work up. Pt verbalized understanding.

## 2019-02-14 ENCOUNTER — Other Ambulatory Visit: Payer: Medicare Other

## 2019-02-14 ENCOUNTER — Ambulatory Visit: Payer: Medicare Other | Admitting: Hematology & Oncology

## 2019-02-14 ENCOUNTER — Ambulatory Visit: Payer: Medicare Other

## 2019-02-14 ENCOUNTER — Inpatient Hospital Stay: Payer: Medicare Other

## 2019-02-28 ENCOUNTER — Other Ambulatory Visit: Payer: Self-pay | Admitting: Family

## 2019-03-07 ENCOUNTER — Encounter: Payer: Self-pay | Admitting: Hematology & Oncology

## 2019-03-07 ENCOUNTER — Other Ambulatory Visit: Payer: Self-pay

## 2019-03-07 ENCOUNTER — Telehealth: Payer: Self-pay | Admitting: Hematology & Oncology

## 2019-03-07 ENCOUNTER — Inpatient Hospital Stay: Payer: Medicare Other | Attending: Hematology & Oncology | Admitting: Hematology & Oncology

## 2019-03-07 ENCOUNTER — Other Ambulatory Visit: Payer: Self-pay | Admitting: *Deleted

## 2019-03-07 ENCOUNTER — Inpatient Hospital Stay: Payer: Medicare Other

## 2019-03-07 VITALS — BP 147/50 | HR 70 | Temp 98.0°F | Resp 16 | Wt 137.0 lb

## 2019-03-07 DIAGNOSIS — Z5112 Encounter for antineoplastic immunotherapy: Secondary | ICD-10-CM | POA: Diagnosis not present

## 2019-03-07 DIAGNOSIS — C541 Malignant neoplasm of endometrium: Secondary | ICD-10-CM | POA: Insufficient documentation

## 2019-03-07 DIAGNOSIS — Z79899 Other long term (current) drug therapy: Secondary | ICD-10-CM | POA: Insufficient documentation

## 2019-03-07 DIAGNOSIS — D62 Acute posthemorrhagic anemia: Secondary | ICD-10-CM | POA: Diagnosis not present

## 2019-03-07 DIAGNOSIS — Z933 Colostomy status: Secondary | ICD-10-CM | POA: Insufficient documentation

## 2019-03-07 DIAGNOSIS — C539 Malignant neoplasm of cervix uteri, unspecified: Secondary | ICD-10-CM

## 2019-03-07 DIAGNOSIS — C799 Secondary malignant neoplasm of unspecified site: Secondary | ICD-10-CM | POA: Diagnosis not present

## 2019-03-07 LAB — CBC WITH DIFFERENTIAL (CANCER CENTER ONLY)
Abs Immature Granulocytes: 0.02 10*3/uL (ref 0.00–0.07)
Basophils Absolute: 0 10*3/uL (ref 0.0–0.1)
Basophils Relative: 0 %
Eosinophils Absolute: 0.1 10*3/uL (ref 0.0–0.5)
Eosinophils Relative: 2 %
HCT: 35.5 % — ABNORMAL LOW (ref 36.0–46.0)
Hemoglobin: 11.6 g/dL — ABNORMAL LOW (ref 12.0–15.0)
Immature Granulocytes: 0 %
Lymphocytes Relative: 10 %
Lymphs Abs: 0.5 10*3/uL — ABNORMAL LOW (ref 0.7–4.0)
MCH: 30.5 pg (ref 26.0–34.0)
MCHC: 32.7 g/dL (ref 30.0–36.0)
MCV: 93.4 fL (ref 80.0–100.0)
Monocytes Absolute: 0.3 10*3/uL (ref 0.1–1.0)
Monocytes Relative: 6 %
Neutro Abs: 3.8 10*3/uL (ref 1.7–7.7)
Neutrophils Relative %: 82 %
Platelet Count: 245 10*3/uL (ref 150–400)
RBC: 3.8 MIL/uL — ABNORMAL LOW (ref 3.87–5.11)
RDW: 13.2 % (ref 11.5–15.5)
WBC Count: 4.7 10*3/uL (ref 4.0–10.5)
nRBC: 0 % (ref 0.0–0.2)

## 2019-03-07 LAB — CMP (CANCER CENTER ONLY)
ALT: 5 U/L (ref 0–44)
AST: 12 U/L — ABNORMAL LOW (ref 15–41)
Albumin: 4.1 g/dL (ref 3.5–5.0)
Alkaline Phosphatase: 65 U/L (ref 38–126)
Anion gap: 7 (ref 5–15)
BUN: 14 mg/dL (ref 8–23)
CO2: 28 mmol/L (ref 22–32)
Calcium: 9.6 mg/dL (ref 8.9–10.3)
Chloride: 104 mmol/L (ref 98–111)
Creatinine: 0.7 mg/dL (ref 0.44–1.00)
GFR, Est AFR Am: >60 mL/min (ref >60)
GFR, Estimated: >60 mL/min (ref >60)
Glucose, Bld: 93 mg/dL (ref 70–99)
Potassium: 3.8 mmol/L (ref 3.5–5.1)
Sodium: 139 mmol/L (ref 135–145)
Total Bilirubin: 0.3 mg/dL (ref 0.3–1.2)
Total Protein: 6.9 g/dL (ref 6.5–8.1)

## 2019-03-07 MED ORDER — TRAMADOL HCL 50 MG PO TABS: 100.0000 mg | ORAL_TABLET | Freq: Two times a day (BID) | ORAL | 0 refills | Status: DC

## 2019-03-07 MED ORDER — SODIUM CHLORIDE 0.9 % IV SOLN
400.0000 mg | Freq: Once | INTRAVENOUS | Status: AC
  Administered 2019-03-07: 400 mg via INTRAVENOUS
  Filled 2019-03-07: qty 16

## 2019-03-07 MED ORDER — SODIUM CHLORIDE 0.9 % IV SOLN
Freq: Once | INTRAVENOUS | Status: AC
  Administered 2019-03-07: 13:00:00 via INTRAVENOUS
  Filled 2019-03-07: qty 250

## 2019-03-07 MED ORDER — SODIUM CHLORIDE 0.9% FLUSH
10.0000 mL | INTRAVENOUS | Status: DC | PRN
  Administered 2019-03-07: 10 mL
  Filled 2019-03-07: qty 10

## 2019-03-07 MED ORDER — HEPARIN SOD (PORK) LOCK FLUSH 100 UNIT/ML IV SOLN
500.0000 [IU] | Freq: Once | INTRAVENOUS | Status: AC | PRN
  Administered 2019-03-07: 500 [IU]
  Filled 2019-03-07: qty 5

## 2019-03-07 NOTE — Patient Instructions (Signed)
Pembrolizumab injection What is this medicine? PEMBROLIZUMAB (pem broe liz ue mab) is a monoclonal antibody. It is used to treat bladder cancer, cervical cancer, endometrial cancer, esophageal cancer, head and neck cancer, hepatocellular cancer, Hodgkin lymphoma, kidney cancer, lymphoma, melanoma, Merkel cell carcinoma, lung cancer, stomach cancer, urothelial cancer, and cancers that have a certain genetic condition. This medicine may be used for other purposes; ask your health care provider or pharmacist if you have questions. COMMON BRAND NAME(S): Keytruda What should I tell my health care provider before I take this medicine? They need to know if you have any of these conditions:  diabetes  immune system problems  inflammatory bowel disease  liver disease  lung or breathing disease  lupus  received or scheduled to receive an organ transplant or a stem-cell transplant that uses donor stem cells  an unusual or allergic reaction to pembrolizumab, other medicines, foods, dyes, or preservatives  pregnant or trying to get pregnant  breast-feeding How should I use this medicine? This medicine is for infusion into a vein. It is given by a health care professional in a hospital or clinic setting. A special MedGuide will be given to you before each treatment. Be sure to read this information carefully each time. Talk to your pediatrician regarding the use of this medicine in children. While this drug may be prescribed for selected conditions, precautions do apply. Overdosage: If you think you have taken too much of this medicine contact a poison control center or emergency room at once. NOTE: This medicine is only for you. Do not share this medicine with others. What if I miss a dose? It is important not to miss your dose. Call your doctor or health care professional if you are unable to keep an appointment. What may interact with this medicine? Interactions have not been studied. Give  your health care provider a list of all the medicines, herbs, non-prescription drugs, or dietary supplements you use. Also tell them if you smoke, drink alcohol, or use illegal drugs. Some items may interact with your medicine. This list may not describe all possible interactions. Give your health care provider a list of all the medicines, herbs, non-prescription drugs, or dietary supplements you use. Also tell them if you smoke, drink alcohol, or use illegal drugs. Some items may interact with your medicine. What should I watch for while using this medicine? Your condition will be monitored carefully while you are receiving this medicine. You may need blood work done while you are taking this medicine. Do not become pregnant while taking this medicine or for 4 months after stopping it. Women should inform their doctor if they wish to become pregnant or think they might be pregnant. There is a potential for serious side effects to an unborn child. Talk to your health care professional or pharmacist for more information. Do not breast-feed an infant while taking this medicine or for 4 months after the last dose. What side effects may I notice from receiving this medicine? Side effects that you should report to your doctor or health care professional as soon as possible:  allergic reactions like skin rash, itching or hives, swelling of the face, lips, or tongue  bloody or black, tarry  breathing problems  changes in vision  chest pain  chills  confusion  constipation  cough  diarrhea  dizziness or feeling faint or lightheaded  fast or irregular heartbeat  fever  flushing  hair loss  joint pain  low blood counts - this   medicine may decrease the number of white blood cells, red blood cells and platelets. You may be at increased risk for infections and bleeding.  muscle pain  muscle weakness  persistent headache  redness, blistering, peeling or loosening of the skin,  including inside the mouth  signs and symptoms of high blood sugar such as dizziness; dry mouth; dry skin; fruity breath; nausea; stomach pain; increased hunger or thirst; increased urination  signs and symptoms of kidney injury like trouble passing urine or change in the amount of urine  signs and symptoms of liver injury like dark urine, light-colored stools, loss of appetite, nausea, right upper belly pain, yellowing of the eyes or skin  sweating  swollen lymph nodes  weight loss Side effects that usually do not require medical attention (report to your doctor or health care professional if they continue or are bothersome):  decreased appetite  muscle pain  tiredness This list may not describe all possible side effects. Call your doctor for medical advice about side effects. You may report side effects to FDA at 1-800-FDA-1088. Where should I keep my medicine? This drug is given in a hospital or clinic and will not be stored at home. NOTE: This sheet is a summary. It may not cover all possible information. If you have questions about this medicine, talk to your doctor, pharmacist, or health care provider.  2020 [employer]/Gold Standard (2018-06-13 13:46:58)  

## 2019-03-07 NOTE — Addendum Note (Signed)
Addended by: Burney Gauze R on: 03/07/2019 01:14 PM   Modules accepted: Orders

## 2019-03-07 NOTE — Progress Notes (Signed)
Hematology and Oncology Follow Up Visit  Shannon Scott [id number] 04-20-1941 78 y.o. 03/07/2019   Principle Diagnosis:  Locally advanced/recurrent endometrial carcinoma undifferentiated -- TMB (HIGH) / MSI HIGH  Past Therapy: Radiation therapyfor 5 -5 1/2 weeks - s/p 20 fractions Taxol/carboplatinumq 7 days -- s/p cycle 6  Current Therapy:   Pembrolizumab 400 mg IV q 6 wk -- start on 01/23/2019 -- s/p cycle #11 of 200 mg q 3 week Zometa 4 mg IV q 3 months -- next dose in 04/2019 IV iron as indicated - last received 11/10/2017   Interim History:  [name] is here today for follow-up and treatment.  She also looks quite good.  She is managing pretty well.  She still is worried about the coronavirus and getting the coronavirus.  She was wondering about her colostomy.  She is wondering if this might be able to be reversed.  She has not seen the surgeon who did this.  I will have to talk to him.  I think that it would be reasonable to consider a colostomy reversal on her as this would help her quality of life.  By her last scans, there really is not any obvious cancer that we can see.  She has had no problems with fever.  There is no cough or shortness of breath.  She has had no bleeding.  There is been no leg swelling.  Overall, her performance status is ECOG 1.    Medications:  Allergies as of 03/07/2019      Reactions   Statins Other (See Comments)   Myalgias and memory problems   Ciprofloxacin Itching   Splotchy redness with itching during IV infusion localized to arm.      Medication List       Accurate as of March 07, 2019 12:44 PM. If you have any questions, ask your nurse or doctor.        amoxicillin 500 MG tablet Commonly known as: AMOXIL Take four tablets prior to dental procedure   diclofenac sodium 1 % Gel Commonly known as: VOLTAREN Apply 2 g topically 4 (four) times daily.   diltiazem 300 MG 24 hr capsule Commonly known as: TIAZAC TAKE  1 CAPSULE(300 MG) BY MOUTH DAILY What changed: See the new instructions.   gabapentin 100 MG capsule Commonly known as: Neurontin Take 1 capsule (100 mg total) by mouth 2 (two) times daily.   ibuprofen 200 MG tablet Commonly known as: ADVIL Take 200 mg by mouth daily as needed (back pain).   lactose free nutrition Liqd Take 237 mLs by mouth 3 (three) times daily with meals.   levothyroxine 50 MCG tablet Commonly known as: SYNTHROID Take 1 tablet (50 mcg total) by mouth daily before breakfast.   lidocaine-prilocaine cream Commonly known as: EMLA Apply 1 application topically as needed.   lipase/protease/amylase 36000 UNITS Cpep capsule Commonly known as: Creon Take 2 capsules (72,000 Units total) by mouth 3 (three) times daily before meals.   ondansetron 8 MG tablet Commonly known as: ZOFRAN TAKE 1 TABLET(8 MG) BY MOUTH EVERY 8 HOURS AS NEEDED FOR NAUSEA OR VOMITING   pantoprazole 40 MG tablet Commonly known as: PROTONIX Take 1 tablet (40 mg total) by mouth daily. What changed:   when to take this  reasons to take this   polyethylene glycol 17 g packet Commonly known as: MIRALAX / GLYCOLAX Take 17 g by mouth daily.   prochlorperazine 10 MG tablet Commonly known as: COMPAZINE Take 1 tablet (10 mg  total) by mouth every 6 (six) hours as needed for nausea or vomiting.   senna-docusate 8.6-50 MG tablet Commonly known as: Senokot-S Take 1 tablet by mouth 2 (two) times daily.   traMADol 50 MG tablet Commonly known as: ULTRAM Take 2 tablets (100 mg total) by mouth 2 (two) times daily.       Allergies:  Allergies  Allergen Reactions  . Statins Other (See Comments)    Myalgias and memory problems  . Ciprofloxacin Itching    Splotchy redness with itching during IV infusion localized to arm.    Past Medical History, Surgical history, Social history, and Family History were reviewed and updated.  Review of Systems: Review of Systems  Constitutional: Negative.    HENT: Negative.   Eyes: Negative.   Respiratory: Negative.   Cardiovascular: Negative.   Gastrointestinal: Negative.   Genitourinary: Negative.   Musculoskeletal: Positive for back pain.  Skin: Negative.   Neurological: Negative.   Endo/Heme/Allergies: Negative.   Psychiatric/Behavioral: Negative.      Physical Exam:  weight is 137 lb (62.1 kg). Her temporal temperature is 98 F (36.7 C). Her blood pressure is 147/50 (abnormal) and her pulse is 70. Her respiration is 16 and oxygen saturation is 98%.   Wt Readings from Last 3 Encounters:  03/07/19 137 lb (62.1 kg)  02/08/19 134 lb (60.8 kg)  01/23/19 134 lb (60.8 kg)    Physical Exam Vitals signs reviewed.  HENT:     Head: Normocephalic and atraumatic.  Eyes:     Pupils: Pupils are equal, round, and reactive to light.  Neck:     Musculoskeletal: Normal range of motion.  Cardiovascular:     Rate and Rhythm: Normal rate and regular rhythm.     Heart sounds: Normal heart sounds.  Pulmonary:     Effort: Pulmonary effort is normal.     Breath sounds: Normal breath sounds.  Abdominal:     General: Bowel sounds are normal.     Palpations: Abdomen is soft.     Comments: Her abdominal exam shows a soft abdomen.  Her colostomy is in the center of her lower abdomen.  She has a relatively large hernia associated with the colostomy.  She has output from the colostomy.  There is no fluid wave.  There is no palpable abdominal mass.  There is no palpable liver or spleen tip.  Musculoskeletal: Normal range of motion.        General: No tenderness or deformity.  Lymphadenopathy:     Cervical: No cervical adenopathy.  Skin:    General: Skin is warm and dry.     Findings: No erythema or rash.  Neurological:     Mental Status: She is alert and oriented to person, place, and time.  Psychiatric:        Behavior: Behavior normal.        Thought Content: Thought content normal.        Judgment: Judgment normal.      Lab Results   Component Value Date   WBC 4.7 03/07/2019   HGB 11.6 (L) 03/07/2019   HCT 35.5 (L) 03/07/2019   MCV 93.4 03/07/2019   PLT 245 03/07/2019   Lab Results  Component Value Date   FERRITIN 501 (H) 01/03/2019   IRON 59 01/03/2019   TIBC 211 (L) 01/03/2019   UIBC 153 01/03/2019   IRONPCTSAT 28 01/03/2019   Lab Results  Component Value Date   RETICCTPCT 0.5 (L) 10/20/2017   RBC 3.80 (L)  03/07/2019   No results found for: KPAFRELGTCHN, LAMBDASER, KAPLAMBRATIO No results found for: IGGSERUM, IGA, IGMSERUM No results found for: Odetta Pink, SPEI   Chemistry      Component Value Date/Time   NA 139 03/07/2019 1130   NA 136 (A) 09/08/2017   K 3.8 03/07/2019 1130   CL 104 03/07/2019 1130   CO2 28 03/07/2019 1130   BUN 14 03/07/2019 1130   BUN 8 09/08/2017   CREATININE 0.70 03/07/2019 1130   CREATININE 0.69 08/02/2016 1519   GLU 111 09/08/2017      Component Value Date/Time   CALCIUM 9.6 03/07/2019 1130   ALKPHOS 65 03/07/2019 1130   AST 12 (L) 03/07/2019 1130   ALT 5 03/07/2019 1130   BILITOT 0.3 03/07/2019 1130       Impression and Plan: Ms. Fonner is a very pleasant 78 yo caucasian female withrecurrent undifferentiated endometrial carcinoma.  I am just very impressed with how well she has done.  Immunotherapy is working very well for her.  I am not surprised by this given the fact that she has a high MSI and unstable MMR.  We have her on the 6-week treatment protocol which she enjoys.  We will plan to get her back in 6 more weeks.  At that time, we will then try to make an adjustment so that we can get her through all of the holidays.  Again we will have to talk to her surgeon who did the colostomy.  I think this was probably done 2 or 3 years ago.  Volanda Napoleon, MD 10/7/202012:44 PM

## 2019-03-07 NOTE — Patient Instructions (Signed)
[facility] Discharge Instructions for Patients Receiving Chemotherapy  Today you received the following chemotherapy agents :  Keytruda.  To help prevent nausea and vomiting after your treatment, we encourage you to take your nausea medication as prescribed.   If you develop nausea and vomiting that is not controlled by your nausea medication, call the clinic.   BELOW ARE SYMPTOMS THAT SHOULD BE REPORTED IMMEDIATELY:  *FEVER GREATER THAN 100.5 F  *CHILLS WITH OR WITHOUT FEVER  NAUSEA AND VOMITING THAT IS NOT CONTROLLED WITH YOUR NAUSEA MEDICATION  *UNUSUAL SHORTNESS OF BREATH  *UNUSUAL BRUISING OR BLEEDING  TENDERNESS IN MOUTH AND THROAT WITH OR WITHOUT PRESENCE OF ULCERS  *URINARY PROBLEMS  *BOWEL PROBLEMS  UNUSUAL RASH Items with * indicate a potential emergency and should be followed up as soon as possible.  Feel free to call the clinic should you have any questions or concerns. The clinic phone number is [phone].  Please show the CHEMO ALERT CARD at check-in to the Emergency Department and triage nurse.  

## 2019-03-07 NOTE — Telephone Encounter (Signed)
Appointments scheduled calendar printed per 10/7 los

## 2019-03-08 LAB — IRON AND TIBC
Iron: 60 ug/dL (ref 41–142)
Saturation Ratios: 31 % (ref 21–57)
TIBC: 194 ug/dL — ABNORMAL LOW (ref 236–444)
UIBC: 134 ug/dL (ref 120–384)

## 2019-03-08 LAB — FERRITIN: Ferritin: 493 ng/mL — ABNORMAL HIGH (ref 11–307)

## 2019-04-11 ENCOUNTER — Other Ambulatory Visit: Payer: Self-pay | Admitting: Family

## 2019-04-17 ENCOUNTER — Inpatient Hospital Stay: Payer: Medicare Other

## 2019-04-17 ENCOUNTER — Inpatient Hospital Stay: Payer: Medicare Other | Attending: Hematology & Oncology

## 2019-04-17 ENCOUNTER — Inpatient Hospital Stay (HOSPITAL_BASED_OUTPATIENT_CLINIC_OR_DEPARTMENT_OTHER): Payer: Medicare Other | Admitting: Hematology & Oncology

## 2019-04-17 ENCOUNTER — Other Ambulatory Visit: Payer: Self-pay

## 2019-04-17 VITALS — BP 117/67 | HR 72 | Temp 98.1°F | Resp 16

## 2019-04-17 DIAGNOSIS — C539 Malignant neoplasm of cervix uteri, unspecified: Secondary | ICD-10-CM

## 2019-04-17 DIAGNOSIS — Z933 Colostomy status: Secondary | ICD-10-CM | POA: Insufficient documentation

## 2019-04-17 DIAGNOSIS — C799 Secondary malignant neoplasm of unspecified site: Secondary | ICD-10-CM

## 2019-04-17 DIAGNOSIS — D62 Acute posthemorrhagic anemia: Secondary | ICD-10-CM

## 2019-04-17 DIAGNOSIS — C541 Malignant neoplasm of endometrium: Secondary | ICD-10-CM

## 2019-04-17 DIAGNOSIS — Z5112 Encounter for antineoplastic immunotherapy: Secondary | ICD-10-CM | POA: Diagnosis not present

## 2019-04-17 DIAGNOSIS — D508 Other iron deficiency anemias: Secondary | ICD-10-CM

## 2019-04-17 LAB — IRON AND TIBC
Iron: 69 ug/dL (ref 41–142)
Saturation Ratios: 34 % (ref 21–57)
TIBC: 203 ug/dL — ABNORMAL LOW (ref 236–444)
UIBC: 134 ug/dL (ref 120–384)

## 2019-04-17 LAB — CMP (CANCER CENTER ONLY)
ALT: 6 U/L (ref 0–44)
AST: 13 U/L — ABNORMAL LOW (ref 15–41)
Albumin: 4.3 g/dL (ref 3.5–5.0)
Alkaline Phosphatase: 74 U/L (ref 38–126)
Anion gap: 8 (ref 5–15)
BUN: 19 mg/dL (ref 8–23)
CO2: 27 mmol/L (ref 22–32)
Calcium: 9.6 mg/dL (ref 8.9–10.3)
Chloride: 106 mmol/L (ref 98–111)
Creatinine: 0.82 mg/dL (ref 0.44–1.00)
GFR, Est AFR Am: >60 mL/min (ref >60)
GFR, Estimated: >60 mL/min (ref >60)
Glucose, Bld: 108 mg/dL — ABNORMAL HIGH (ref 70–99)
Potassium: 3.9 mmol/L (ref 3.5–5.1)
Sodium: 141 mmol/L (ref 135–145)
Total Bilirubin: 0.3 mg/dL (ref 0.3–1.2)
Total Protein: 6.7 g/dL (ref 6.5–8.1)

## 2019-04-17 LAB — CBC WITH DIFFERENTIAL (CANCER CENTER ONLY)
Abs Immature Granulocytes: 0.01 10*3/uL (ref 0.00–0.07)
Basophils Absolute: 0 10*3/uL (ref 0.0–0.1)
Basophils Relative: 1 %
Eosinophils Absolute: 0.5 10*3/uL (ref 0.0–0.5)
Eosinophils Relative: 9 %
HCT: 34.3 % — ABNORMAL LOW (ref 36.0–46.0)
Hemoglobin: 11.1 g/dL — ABNORMAL LOW (ref 12.0–15.0)
Immature Granulocytes: 0 %
Lymphocytes Relative: 9 %
Lymphs Abs: 0.4 10*3/uL — ABNORMAL LOW (ref 0.7–4.0)
MCH: 30.2 pg (ref 26.0–34.0)
MCHC: 32.4 g/dL (ref 30.0–36.0)
MCV: 93.5 fL (ref 80.0–100.0)
Monocytes Absolute: 0.3 10*3/uL (ref 0.1–1.0)
Monocytes Relative: 7 %
Neutro Abs: 3.8 10*3/uL (ref 1.7–7.7)
Neutrophils Relative %: 74 %
Platelet Count: 262 10*3/uL (ref 150–400)
RBC: 3.67 MIL/uL — ABNORMAL LOW (ref 3.87–5.11)
RDW: 12.8 % (ref 11.5–15.5)
WBC Count: 5.1 10*3/uL (ref 4.0–10.5)
nRBC: 0 % (ref 0.0–0.2)

## 2019-04-17 LAB — FERRITIN: Ferritin: 557 ng/mL — ABNORMAL HIGH (ref 11–307)

## 2019-04-17 MED ORDER — SODIUM CHLORIDE 0.9 % IV SOLN
Freq: Once | INTRAVENOUS | Status: AC
  Administered 2019-04-17: 10:00:00 via INTRAVENOUS
  Filled 2019-04-17: qty 250

## 2019-04-17 MED ORDER — SODIUM CHLORIDE 0.9% FLUSH
10.0000 mL | INTRAVENOUS | Status: DC | PRN
  Administered 2019-04-17: 10 mL
  Filled 2019-04-17: qty 10

## 2019-04-17 MED ORDER — SODIUM CHLORIDE 0.9 % IV SOLN
400.0000 mg | Freq: Once | INTRAVENOUS | Status: AC
  Administered 2019-04-17: 400 mg via INTRAVENOUS
  Filled 2019-04-17: qty 16

## 2019-04-17 MED ORDER — ZOLEDRONIC ACID 4 MG/5ML IV CONC
3.3000 mg | Freq: Once | INTRAVENOUS | Status: AC
  Administered 2019-04-17: 3.3 mg via INTRAVENOUS
  Filled 2019-04-17: qty 4.13

## 2019-04-17 MED ORDER — HEPARIN SOD (PORK) LOCK FLUSH 100 UNIT/ML IV SOLN
500.0000 [IU] | Freq: Once | INTRAVENOUS | Status: AC | PRN
  Administered 2019-04-17: 500 [IU]
  Filled 2019-04-17: qty 5

## 2019-04-17 NOTE — Progress Notes (Signed)
Hematology and Oncology Follow Up Visit  Shannon Scott [id number] April 15, 1941 78 y.o. 04/17/2019   Principle Diagnosis:  Locally advanced/recurrent endometrial carcinoma undifferentiated -- TMB (HIGH) / MSI HIGH  Past Therapy: Radiation therapyfor 5 -5 1/2 weeks - s/p 20 fractions Taxol/carboplatinumq 7 days -- s/p cycle 6  Current Therapy:   Pembrolizumab 400 mg IV q 6 wk -- start on 01/23/2019 -- s/p cycle #12 of 200 mg q 3 week Zometa 4 mg IV q 3 months -- next dose in 04/2019 IV iron as indicated - last received 11/10/2017   Interim History:  Shannon Scott is here today for follow-up and treatment.  She also looks quite good.  She is managing pretty well.  She still is worried about the coronavirus and getting the coronavirus.  Because of the coronavirus, she will have a very small and quiet Thanksgiving.  She still has not seen the surgeon about the colostomy reversal.  She is a little worried about having this done.  She is not sure that she wants to have a barium enema.  I told her that she can make the decision herself as when she feels comfortable having a colostomy reversal.  We really have to do another scan on her to see how the cancer is doing.  Her last PET scan was in back in May.  I will see about getting 1 set up for her.  We will do this in December.  Her appetite has been good.  Her weight is going up.  She has had no problems with nausea or vomiting.  She has had no issues with the colostomy.  There is no cough.  She is had no fever.  She has had no bleeding.  Overall, I would say her performance status is ECOG 1.   Medications:  Allergies as of 04/17/2019      Reactions   Statins Other (See Comments)   Myalgias and memory problems   Ciprofloxacin Itching   Splotchy redness with itching during IV infusion localized to arm.      Medication List       Accurate as of April 17, 2019  9:43 AM. If you have any questions, ask your nurse or  doctor.        amoxicillin 500 MG tablet Commonly known as: AMOXIL Take four tablets prior to dental procedure   diclofenac sodium 1 % Gel Commonly known as: VOLTAREN Apply 2 g topically 4 (four) times daily.   diltiazem 300 MG 24 hr capsule Commonly known as: TIAZAC TAKE 1 CAPSULE(300 MG) BY MOUTH DAILY What changed: See the new instructions.   gabapentin 100 MG capsule Commonly known as: Neurontin Take 1 capsule (100 mg total) by mouth 2 (two) times daily.   ibuprofen 200 MG tablet Commonly known as: ADVIL Take 200 mg by mouth daily as needed (back pain).   lactose free nutrition Liqd Take 237 mLs by mouth 3 (three) times daily with meals.   levothyroxine 50 MCG tablet Commonly known as: SYNTHROID Take 1 tablet (50 mcg total) by mouth daily before breakfast.   lidocaine-prilocaine cream Commonly known as: EMLA Apply 1 application topically as needed.   lipase/protease/amylase 36000 UNITS Cpep capsule Commonly known as: Creon Take 2 capsules (72,000 Units total) by mouth 3 (three) times daily before meals.   ondansetron 8 MG tablet Commonly known as: ZOFRAN TAKE 1 TABLET(8 MG) BY MOUTH EVERY 8 HOURS AS NEEDED FOR NAUSEA OR VOMITING   pantoprazole 40 MG tablet Commonly  known as: PROTONIX Take 1 tablet (40 mg total) by mouth daily. What changed:   when to take this  reasons to take this   polyethylene glycol 17 g packet Commonly known as: MIRALAX / GLYCOLAX Take 17 g by mouth daily.   prochlorperazine 10 MG tablet Commonly known as: COMPAZINE Take 1 tablet (10 mg total) by mouth every 6 (six) hours as needed for nausea or vomiting.   senna-docusate 8.6-50 MG tablet Commonly known as: Senokot-S Take 1 tablet by mouth 2 (two) times daily.   traMADol 50 MG tablet Commonly known as: ULTRAM Take 2 tablets (100 mg total) by mouth 2 (two) times daily.       Allergies:  Allergies  Allergen Reactions   Statins Other (See Comments)    Myalgias and  memory problems   Ciprofloxacin Itching    Splotchy redness with itching during IV infusion localized to arm.    Past Medical History, Surgical history, Social history, and Family History were reviewed and updated.  Review of Systems: Review of Systems  Constitutional: Negative.   HENT: Negative.   Eyes: Negative.   Respiratory: Negative.   Cardiovascular: Negative.   Gastrointestinal: Negative.   Genitourinary: Negative.   Musculoskeletal: Positive for back pain.  Skin: Negative.   Neurological: Negative.   Endo/Heme/Allergies: Negative.   Psychiatric/Behavioral: Negative.      Physical Exam:  vitals were not taken for this visit.   Wt Readings from Last 3 Encounters:  03/07/19 137 lb (62.1 kg)  02/08/19 134 lb (60.8 kg)  01/23/19 134 lb (60.8 kg)    Physical Exam Vitals signs reviewed.  HENT:     Head: Normocephalic and atraumatic.  Eyes:     Pupils: Pupils are equal, round, and reactive to light.  Neck:     Musculoskeletal: Normal range of motion.  Cardiovascular:     Rate and Rhythm: Normal rate and regular rhythm.     Heart sounds: Normal heart sounds.  Pulmonary:     Effort: Pulmonary effort is normal.     Breath sounds: Normal breath sounds.  Abdominal:     General: Bowel sounds are normal.     Palpations: Abdomen is soft.     Comments: Her abdominal exam shows a soft abdomen.  Her colostomy is in the center of her lower abdomen.  She has a relatively large hernia associated with the colostomy.  She has output from the colostomy.  There is no fluid wave.  There is no palpable abdominal mass.  There is no palpable liver or spleen tip.  Musculoskeletal: Normal range of motion.        General: No tenderness or deformity.  Lymphadenopathy:     Cervical: No cervical adenopathy.  Skin:    General: Skin is warm and dry.     Findings: No erythema or rash.  Neurological:     Mental Status: She is alert and oriented to person, place, and time.  Psychiatric:         Behavior: Behavior normal.        Thought Content: Thought content normal.        Judgment: Judgment normal.      Lab Results  Component Value Date   WBC 5.1 04/17/2019   HGB 11.1 (L) 04/17/2019   HCT 34.3 (L) 04/17/2019   MCV 93.5 04/17/2019   PLT 262 04/17/2019   Lab Results  Component Value Date   FERRITIN 493 (H) 03/07/2019   IRON 60 03/07/2019   TIBC  194 (L) 03/07/2019   UIBC 134 03/07/2019   IRONPCTSAT 31 03/07/2019   Lab Results  Component Value Date   RETICCTPCT 0.5 (L) 10/20/2017   RBC 3.67 (L) 04/17/2019   No results found for: KPAFRELGTCHN, LAMBDASER, KAPLAMBRATIO No results found for: IGGSERUM, IGA, IGMSERUM No results found for: Odetta Pink, SPEI   Chemistry      Component Value Date/Time   NA 139 03/07/2019 1130   NA 136 (A) 09/08/2017   K 3.8 03/07/2019 1130   CL 104 03/07/2019 1130   CO2 28 03/07/2019 1130   BUN 14 03/07/2019 1130   BUN 8 09/08/2017   CREATININE 0.70 03/07/2019 1130   CREATININE 0.69 08/02/2016 1519   GLU 111 09/08/2017      Component Value Date/Time   CALCIUM 9.6 03/07/2019 1130   ALKPHOS 65 03/07/2019 1130   AST 12 (L) 03/07/2019 1130   ALT 5 03/07/2019 1130   BILITOT 0.3 03/07/2019 1130       Impression and Plan: Ms. Burchfield is a very pleasant 78 yo caucasian female withrecurrent undifferentiated endometrial carcinoma.  I am just very impressed with how well she has done.  Immunotherapy is working very well for her.  I am not surprised by this given the fact that she has a high MSI and unstable MMR.  She really likes the 6-week intervals for treatment.  We will now get her through the Christmas holidays.  She will be treated today.  Her next treatment will be the first week in January.  She will, again, decide about having the colostomy reversed.  Volanda Napoleon, MD 11/17/20209:43 AM

## 2019-04-17 NOTE — Patient Instructions (Signed)
Implanted Port Insertion, Care After This sheet gives you information about how to care for yourself after your procedure. Your health care provider may also give you more specific instructions. If you have problems or questions, contact your health care provider. What can I expect after the procedure? After the procedure, it is common to have:  Discomfort at the port insertion site.  Bruising on the skin over the port. This should improve over 3-4 days. Follow these instructions at home: Port care  After your port is placed, you will get a manufacturer's information card. The card has information about your port. Keep this card with you at all times.  Take care of the port as told by your health care provider. Ask your health care provider if you or a family member can get training for taking care of the port at home. A home health care nurse may also take care of the port.  Make sure to remember what type of port you have. Incision care      Follow instructions from your health care provider about how to take care of your port insertion site. Make sure you: ? Wash your hands with soap and water before and after you change your bandage (dressing). If soap and water are not available, use hand sanitizer. ? Change your dressing as told by your health care provider. ? Leave stitches (sutures), skin glue, or adhesive strips in place. These skin closures may need to stay in place for 2 weeks or longer. If adhesive strip edges start to loosen and curl up, you may trim the loose edges. Do not remove adhesive strips completely unless your health care provider tells you to do that.  Check your port insertion site every day for signs of infection. Check for: ? Redness, swelling, or pain. ? Fluid or blood. ? Warmth. ? Pus or a bad smell. Activity  Return to your normal activities as told by your health care provider. Ask your health care provider what activities are safe for you.  Do not  lift anything that is heavier than 10 lb (4.5 kg), or the limit that you are told, until your health care provider says that it is safe. General instructions  Take over-the-counter and prescription medicines only as told by your health care provider.  Do not take baths, swim, or use a hot tub until your health care provider approves. Ask your health care provider if you may take showers. You may only be allowed to take sponge baths.  Do not drive for 24 hours if you were given a sedative during your procedure.  Wear a medical alert bracelet in case of an emergency. This will tell any health care providers that you have a port.  Keep all follow-up visits as told by your health care provider. This is important. Contact a health care provider if:  You cannot flush your port with saline as directed, or you cannot draw blood from the port.  You have a fever or chills.  You have redness, swelling, or pain around your port insertion site.  You have fluid or blood coming from your port insertion site.  Your port insertion site feels warm to the touch.  You have pus or a bad smell coming from the port insertion site. Get help right away if:  You have chest pain or shortness of breath.  You have bleeding from your port that you cannot control. Summary  Take care of the port as told by your health   care provider. Keep the manufacturer's information card with you at all times.  Change your dressing as told by your health care provider.  Contact a health care provider if you have a fever or chills or if you have redness, swelling, or pain around your port insertion site.  Keep all follow-up visits as told by your health care provider. This information is not intended to replace advice given to you by your health care provider. Make sure you discuss any questions you have with your health care provider. Document Released: 03/07/2013 Document Revised: 12/13/2017 Document Reviewed: 12/13/2017  Elsevier Patient Education  2020 [employer]  

## 2019-04-17 NOTE — Patient Instructions (Signed)
[facility] Discharge Instructions for Patients Receiving Chemotherapy  Today you received the following chemotherapy agents:  Keytruda  To help prevent nausea and vomiting after your treatment, we encourage you to take your nausea medication as ordered per MD.    If you develop nausea and vomiting that is not controlled by your nausea medication, call the clinic.   BELOW ARE SYMPTOMS THAT SHOULD BE REPORTED IMMEDIATELY:  *FEVER GREATER THAN 100.5 F  *CHILLS WITH OR WITHOUT FEVER  NAUSEA AND VOMITING THAT IS NOT CONTROLLED WITH YOUR NAUSEA MEDICATION  *UNUSUAL SHORTNESS OF BREATH  *UNUSUAL BRUISING OR BLEEDING  TENDERNESS IN MOUTH AND THROAT WITH OR WITHOUT PRESENCE OF ULCERS  *URINARY PROBLEMS  *BOWEL PROBLEMS  UNUSUAL RASH Items with * indicate a potential emergency and should be followed up as soon as possible.  Feel free to call the clinic should you have any questions or concerns. The clinic phone number is [phone].  Please show the CHEMO ALERT CARD at check-in to the Emergency Department and triage nurse.   

## 2019-04-30 ENCOUNTER — Other Ambulatory Visit: Payer: Self-pay | Admitting: *Deleted

## 2019-04-30 MED ORDER — TRAMADOL HCL 50 MG PO TABS: 100.0000 mg | ORAL_TABLET | Freq: Two times a day (BID) | ORAL | 0 refills | Status: DC

## 2019-05-17 ENCOUNTER — Encounter (HOSPITAL_COMMUNITY): Payer: Medicare Other

## 2019-05-29 ENCOUNTER — Other Ambulatory Visit: Payer: Self-pay | Admitting: *Deleted

## 2019-05-29 MED ORDER — TRAMADOL HCL 50 MG PO TABS: 100.0000 mg | ORAL_TABLET | Freq: Two times a day (BID) | ORAL | 0 refills | Status: DC

## 2019-05-31 ENCOUNTER — Other Ambulatory Visit: Payer: Self-pay

## 2019-05-31 DIAGNOSIS — C541 Malignant neoplasm of endometrium: Secondary | ICD-10-CM

## 2019-06-04 ENCOUNTER — Inpatient Hospital Stay: Payer: Medicare Other

## 2019-06-04 ENCOUNTER — Inpatient Hospital Stay: Payer: Medicare Other | Admitting: Hematology & Oncology

## 2019-06-08 ENCOUNTER — Encounter: Payer: Self-pay | Admitting: Family

## 2019-06-08 ENCOUNTER — Inpatient Hospital Stay: Payer: Medicare Other

## 2019-06-08 ENCOUNTER — Inpatient Hospital Stay (HOSPITAL_BASED_OUTPATIENT_CLINIC_OR_DEPARTMENT_OTHER): Payer: Medicare Other | Admitting: Family

## 2019-06-08 ENCOUNTER — Inpatient Hospital Stay: Payer: Medicare Other | Attending: Hematology & Oncology

## 2019-06-08 ENCOUNTER — Other Ambulatory Visit: Payer: Self-pay

## 2019-06-08 VITALS — BP 156/70 | HR 71 | Temp 98.5°F | Resp 18 | Ht 61.0 in | Wt 141.1 lb

## 2019-06-08 DIAGNOSIS — Z79899 Other long term (current) drug therapy: Secondary | ICD-10-CM | POA: Insufficient documentation

## 2019-06-08 DIAGNOSIS — C799 Secondary malignant neoplasm of unspecified site: Secondary | ICD-10-CM

## 2019-06-08 DIAGNOSIS — R21 Rash and other nonspecific skin eruption: Secondary | ICD-10-CM | POA: Diagnosis not present

## 2019-06-08 DIAGNOSIS — Z95828 Presence of other vascular implants and grafts: Secondary | ICD-10-CM

## 2019-06-08 DIAGNOSIS — Z9221 Personal history of antineoplastic chemotherapy: Secondary | ICD-10-CM | POA: Insufficient documentation

## 2019-06-08 DIAGNOSIS — C541 Malignant neoplasm of endometrium: Secondary | ICD-10-CM | POA: Diagnosis not present

## 2019-06-08 DIAGNOSIS — M7989 Other specified soft tissue disorders: Secondary | ICD-10-CM | POA: Diagnosis not present

## 2019-06-08 DIAGNOSIS — Z923 Personal history of irradiation: Secondary | ICD-10-CM | POA: Insufficient documentation

## 2019-06-08 DIAGNOSIS — C539 Malignant neoplasm of cervix uteri, unspecified: Secondary | ICD-10-CM

## 2019-06-08 DIAGNOSIS — B373 Candidiasis of vulva and vagina: Secondary | ICD-10-CM

## 2019-06-08 DIAGNOSIS — Z5112 Encounter for antineoplastic immunotherapy: Secondary | ICD-10-CM | POA: Diagnosis not present

## 2019-06-08 DIAGNOSIS — D508 Other iron deficiency anemias: Secondary | ICD-10-CM

## 2019-06-08 DIAGNOSIS — D509 Iron deficiency anemia, unspecified: Secondary | ICD-10-CM | POA: Insufficient documentation

## 2019-06-08 DIAGNOSIS — B3731 Acute candidiasis of vulva and vagina: Secondary | ICD-10-CM

## 2019-06-08 LAB — CMP (CANCER CENTER ONLY)
ALT: 7 U/L (ref 0–44)
AST: 12 U/L — ABNORMAL LOW (ref 15–41)
Albumin: 4.2 g/dL (ref 3.5–5.0)
Alkaline Phosphatase: 63 U/L (ref 38–126)
Anion gap: 8 (ref 5–15)
BUN: 18 mg/dL (ref 8–23)
CO2: 27 mmol/L (ref 22–32)
Calcium: 9.9 mg/dL (ref 8.9–10.3)
Chloride: 103 mmol/L (ref 98–111)
Creatinine: 0.87 mg/dL (ref 0.44–1.00)
GFR, Est AFR Am: >60 mL/min (ref >60)
GFR, Estimated: >60 mL/min (ref >60)
Glucose, Bld: 98 mg/dL (ref 70–99)
Potassium: 3.9 mmol/L (ref 3.5–5.1)
Sodium: 138 mmol/L (ref 135–145)
Total Bilirubin: 0.4 mg/dL (ref 0.3–1.2)
Total Protein: 7.2 g/dL (ref 6.5–8.1)

## 2019-06-08 LAB — CBC WITH DIFFERENTIAL (CANCER CENTER ONLY)
Abs Immature Granulocytes: 0.02 10*3/uL (ref 0.00–0.07)
Basophils Absolute: 0 10*3/uL (ref 0.0–0.1)
Basophils Relative: 1 %
Eosinophils Absolute: 0.1 10*3/uL (ref 0.0–0.5)
Eosinophils Relative: 3 %
HCT: 35.9 % — ABNORMAL LOW (ref 36.0–46.0)
Hemoglobin: 11.7 g/dL — ABNORMAL LOW (ref 12.0–15.0)
Immature Granulocytes: 0 %
Lymphocytes Relative: 10 %
Lymphs Abs: 0.5 10*3/uL — ABNORMAL LOW (ref 0.7–4.0)
MCH: 29.8 pg (ref 26.0–34.0)
MCHC: 32.6 g/dL (ref 30.0–36.0)
MCV: 91.3 fL (ref 80.0–100.0)
Monocytes Absolute: 0.3 10*3/uL (ref 0.1–1.0)
Monocytes Relative: 6 %
Neutro Abs: 4.3 10*3/uL (ref 1.7–7.7)
Neutrophils Relative %: 80 %
Platelet Count: 250 10*3/uL (ref 150–400)
RBC: 3.93 MIL/uL (ref 3.87–5.11)
RDW: 12.8 % (ref 11.5–15.5)
WBC Count: 5.3 10*3/uL (ref 4.0–10.5)
nRBC: 0 % (ref 0.0–0.2)

## 2019-06-08 MED ORDER — HEPARIN SOD (PORK) LOCK FLUSH 100 UNIT/ML IV SOLN
500.0000 [IU] | Freq: Once | INTRAVENOUS | Status: AC
  Administered 2019-06-08: 500 [IU] via INTRAVENOUS
  Filled 2019-06-08: qty 5

## 2019-06-08 MED ORDER — SODIUM CHLORIDE 0.9 % IV SOLN
400.0000 mg | Freq: Once | INTRAVENOUS | Status: AC
  Administered 2019-06-08: 12:00:00 400 mg via INTRAVENOUS
  Filled 2019-06-08: qty 16

## 2019-06-08 MED ORDER — SODIUM CHLORIDE 0.9% FLUSH
10.0000 mL | INTRAVENOUS | Status: DC | PRN
  Administered 2019-06-08: 10 mL via INTRAVENOUS
  Filled 2019-06-08: qty 10

## 2019-06-08 MED ORDER — METHYLPREDNISOLONE 4 MG PO TBPK: ORAL_TABLET | ORAL | 0 refills | Status: DC

## 2019-06-08 MED ORDER — SODIUM CHLORIDE 0.9% FLUSH
10.0000 mL | INTRAVENOUS | Status: DC | PRN
  Filled 2019-06-08: qty 10

## 2019-06-08 MED ORDER — NYSTATIN 100000 UNIT/GM EX CREA: 1.0000 "application " | TOPICAL_CREAM | Freq: Two times a day (BID) | CUTANEOUS | 1 refills | Status: DC

## 2019-06-08 MED ORDER — HEPARIN SOD (PORK) LOCK FLUSH 100 UNIT/ML IV SOLN
500.0000 [IU] | Freq: Once | INTRAVENOUS | Status: DC
  Filled 2019-06-08: qty 5

## 2019-06-08 MED ORDER — SODIUM CHLORIDE 0.9 % IV SOLN
Freq: Once | INTRAVENOUS | Status: AC
  Filled 2019-06-08: qty 250

## 2019-06-08 NOTE — Patient Instructions (Signed)
Pembrolizumab injection What is this medicine? PEMBROLIZUMAB (pem broe liz ue mab) is a monoclonal antibody. It is used to treat certain types of cancer. This medicine may be used for other purposes; ask your health care provider or pharmacist if you have questions. COMMON BRAND NAME(S): Keytruda What should I tell my health care provider before I take this medicine? They need to know if you have any of these conditions:  diabetes  immune system problems  inflammatory bowel disease  liver disease  lung or breathing disease  lupus  received or scheduled to receive an organ transplant or a stem-cell transplant that uses donor stem cells  an unusual or allergic reaction to pembrolizumab, other medicines, foods, dyes, or preservatives  pregnant or trying to get pregnant  breast-feeding How should I use this medicine? This medicine is for infusion into a vein. It is given by a health care professional in a hospital or clinic setting. A special MedGuide will be given to you before each treatment. Be sure to read this information carefully each time. Talk to your pediatrician regarding the use of this medicine in children. While this drug may be prescribed for children as young as 6 months for selected conditions, precautions do apply. Overdosage: If you think you have taken too much of this medicine contact a poison control center or emergency room at once. NOTE: This medicine is only for you. Do not share this medicine with others. What if I miss a dose? It is important not to miss your dose. Call your doctor or health care professional if you are unable to keep an appointment. What may interact with this medicine? Interactions have not been studied. Give your health care provider a list of all the medicines, herbs, non-prescription drugs, or dietary supplements you use. Also tell them if you smoke, drink alcohol, or use illegal drugs. Some items may interact with your medicine. This  list may not describe all possible interactions. Give your health care provider a list of all the medicines, herbs, non-prescription drugs, or dietary supplements you use. Also tell them if you smoke, drink alcohol, or use illegal drugs. Some items may interact with your medicine. What should I watch for while using this medicine? Your condition will be monitored carefully while you are receiving this medicine. You may need blood work done while you are taking this medicine. Do not become pregnant while taking this medicine or for 4 months after stopping it. Women should inform their doctor if they wish to become pregnant or think they might be pregnant. There is a potential for serious side effects to an unborn child. Talk to your health care professional or pharmacist for more information. Do not breast-feed an infant while taking this medicine or for 4 months after the last dose. What side effects may I notice from receiving this medicine? Side effects that you should report to your doctor or health care professional as soon as possible:  allergic reactions like skin rash, itching or hives, swelling of the face, lips, or tongue  bloody or black, tarry  breathing problems  changes in vision  chest pain  chills  confusion  constipation  cough  diarrhea  dizziness or feeling faint or lightheaded  fast or irregular heartbeat  fever  flushing  joint pain  low blood counts - this medicine may decrease the number of white blood cells, red blood cells and platelets. You may be at increased risk for infections and bleeding.  muscle pain  muscle  weakness  pain, tingling, numbness in the hands or feet  persistent headache  redness, blistering, peeling or loosening of the skin, including inside the mouth  signs and symptoms of high blood sugar such as dizziness; dry mouth; dry skin; fruity breath; nausea; stomach pain; increased hunger or thirst; increased urination  signs  and symptoms of kidney injury like trouble passing urine or change in the amount of urine  signs and symptoms of liver injury like dark urine, light-colored stools, loss of appetite, nausea, right upper belly pain, yellowing of the eyes or skin  sweating  swollen lymph nodes  weight loss Side effects that usually do not require medical attention (report to your doctor or health care professional if they continue or are bothersome):  decreased appetite  hair loss  muscle pain  tiredness This list may not describe all possible side effects. Call your doctor for medical advice about side effects. You may report side effects to FDA at 1-800-FDA-1088. Where should I keep my medicine? This drug is given in a hospital or clinic and will not be stored at home. NOTE: This sheet is a summary. It may not cover all possible information. If you have questions about this medicine, talk to your doctor, pharmacist, or health care provider.  2020 Elsevier/Gold Standard (2019-03-23 18:07:58)

## 2019-06-08 NOTE — Progress Notes (Signed)
Hematology and Oncology Follow Up Visit  Shannon Scott [id number] 1941/01/08 79 y.o. 06/08/2019   Principle Diagnosis:  Locally advanced/recurrent endometrial carcinoma undifferentiated -- TMB (HIGH) / MSI HIGH  Past Therapy: Radiation therapyfor 5 -5 1/2 weeks - s/p 20 fractions Taxol/carboplatinumq 7 days -- s/p cycle 6  Current Therapy:   Pembrolizumab 400 mg IV q 6 wk -- start on 01/23/2019 -- s/p cycle 12 of 200 mg q 3 week Zometa 4 mg IV q 3 months -- next dose in 04/2019 IV iron as indicated - last received 11/10/2017   Interim History:  Shannon Scott is here today for follow-up. She is doing well but has noted some random red bumps on her arms, chest and legs for the last week. They do itch at times but she has not noted any itching or burning.  She states that she has vaginal redness and burning in urination that sounds like a yeast infection.  No fever, chills, n/v, cough, dizziness, SOB, chest pain, palpitations, abdominal pain or changes in bowel or bladder habits.  Her colostomy is functioning appropriately. She states that she is holding off on her abdominal xray with Dr. Michael Boston for now. She states that she will follow up with him eventually once she has the scan to discuss the possibility of reversing the ostomy and having an umbilical hernia repair.  She has chronic swelling in her lower extremities, worse in the left leg, she states is due to lymphedema. Pedal pulses are 2+. No redness or lesions noted on exam.  No tenderness, numbness or tingling in her extremities.  No falls or syncopal episodes to report. She ambulated with a cane for support and does well. For long distances she does use a wheelchair.  She states that she is eating well and is staying hydrated. Her weight is stable.   ECOG Performance Status: 1 - Symptomatic but completely ambulatory  Medications:  Allergies as of 06/08/2019      Reactions   Statins Other (See Comments)   Myalgias  and memory problems   Ciprofloxacin Itching   Splotchy redness with itching during IV infusion localized to arm.      Medication List       Accurate as of June 08, 2019 10:37 AM. If you have any questions, ask your nurse or doctor.        amoxicillin 500 MG tablet Commonly known as: AMOXIL Take four tablets prior to dental procedure   diclofenac sodium 1 % Gel Commonly known as: VOLTAREN Apply 2 g topically 4 (four) times daily.   diltiazem 300 MG 24 hr capsule Commonly known as: TIAZAC TAKE 1 CAPSULE(300 MG) BY MOUTH DAILY What changed: See the new instructions.   gabapentin 100 MG capsule Commonly known as: Neurontin Take 1 capsule (100 mg total) by mouth 2 (two) times daily.   ibuprofen 200 MG tablet Commonly known as: ADVIL Take 200 mg by mouth daily as needed (back pain).   lactose free nutrition Liqd Take 237 mLs by mouth 3 (three) times daily with meals.   levothyroxine 50 MCG tablet Commonly known as: SYNTHROID Take 1 tablet (50 mcg total) by mouth daily before breakfast.   lidocaine-prilocaine cream Commonly known as: EMLA Apply 1 application topically as needed.   lipase/protease/amylase 36000 UNITS Cpep capsule Commonly known as: Creon Take 2 capsules (72,000 Units total) by mouth 3 (three) times daily before meals.   ondansetron 8 MG tablet Commonly known as: ZOFRAN TAKE 1 TABLET(8 MG) BY  MOUTH EVERY 8 HOURS AS NEEDED FOR NAUSEA OR VOMITING   pantoprazole 40 MG tablet Commonly known as: PROTONIX Take 1 tablet (40 mg total) by mouth daily. What changed:   when to take this  reasons to take this   polyethylene glycol 17 g packet Commonly known as: MIRALAX / GLYCOLAX Take 17 g by mouth daily.   prochlorperazine 10 MG tablet Commonly known as: COMPAZINE Take 1 tablet (10 mg total) by mouth every 6 (six) hours as needed for nausea or vomiting.   senna-docusate 8.6-50 MG tablet Commonly known as: Senokot-S Take 1 tablet by mouth 2 (two)  times daily.   traMADol 50 MG tablet Commonly known as: ULTRAM Take 2 tablets (100 mg total) by mouth 2 (two) times daily.       Allergies:  Allergies  Allergen Reactions  . Statins Other (See Comments)    Myalgias and memory problems  . Ciprofloxacin Itching    Splotchy redness with itching during IV infusion localized to arm.    Past Medical History, Surgical history, Social history, and Family History were reviewed and updated.  Review of Systems: All other 10 point review of systems is negative.   Physical Exam:  height is _0  (1.549 m) and weight is 141 lb 1.3 oz (64 kg). Her temporal temperature is 98.5 F (36.9 C). Her blood pressure is 156/70 (abnormal) and her pulse is 71. Her respiration is 18 and oxygen saturation is 98%.   Wt Readings from Last 3 Encounters:  06/08/19 141 lb 1.3 oz (64 kg)  03/07/19 137 lb (62.1 kg)  02/08/19 134 lb (60.8 kg)    Ocular: Sclerae unicteric, pupils equal, round and reactive to light Ear-nose-throat: Oropharynx clear, dentition fair Lymphatic: No cervical or supraclavicular adenopathy Lungs no rales or rhonchi, good excursion bilaterally Heart regular rate and rhythm, no murmur appreciated Abd soft, nontender, positive bowel sounds, stoma and ostomy intact, umbilical hernia present  MSK no focal spinal tenderness, no joint edema Neuro: non-focal, well-oriented, appropriate affect Breasts:   Lab Results  Component Value Date   WBC 5.3 06/08/2019   HGB 11.7 (L) 06/08/2019   HCT 35.9 (L) 06/08/2019   MCV 91.3 06/08/2019   PLT 250 06/08/2019   Lab Results  Component Value Date   FERRITIN 557 (H) 04/17/2019   IRON 69 04/17/2019   TIBC 203 (L) 04/17/2019   UIBC 134 04/17/2019   IRONPCTSAT 34 04/17/2019   Lab Results  Component Value Date   RETICCTPCT 0.5 (L) 10/20/2017   RBC 3.93 06/08/2019   No results found for: KPAFRELGTCHN, LAMBDASER, KAPLAMBRATIO No results found for: IGGSERUM, IGA, IGMSERUM No results found  for: Odetta Pink, SPEI   Chemistry      Component Value Date/Time   NA 141 04/17/2019 0926   NA 136 (A) 09/08/2017 0000   K 3.9 04/17/2019 0926   CL 106 04/17/2019 0926   CO2 27 04/17/2019 0926   BUN 19 04/17/2019 0926   BUN 8 09/08/2017 0000   CREATININE 0.82 04/17/2019 0926   CREATININE 0.69 08/02/2016 1519   GLU 111 09/08/2017 0000      Component Value Date/Time   CALCIUM 9.6 04/17/2019 0926   ALKPHOS 74 04/17/2019 0926   AST 13 (L) 04/17/2019 0926   ALT 6 04/17/2019 0926   BILITOT 0.3 04/17/2019 0926       Impression and Plan: Shannon Scott is a very pleasant 79 yo caucasian female withrecurrent undifferentiated endometrial carcinoma.  We will proceed with treatment today as planned.  We will try again to get approval for her PET scan.  Prescription for Medrol dose pack for her rash and also nystatin cream from vaginal yeast sent to her pharmacy.  We will plan to see her back in another 6 weeks.  She will contact our office with any questions or concerns. We can certainly see her sooner if needed.    Laverna Peace, NP 1/8/202110:37 AM

## 2019-06-11 ENCOUNTER — Telehealth: Payer: Self-pay | Admitting: Hematology & Oncology

## 2019-06-11 LAB — IRON AND TIBC
Iron: 38 ug/dL — ABNORMAL LOW (ref 41–142)
Saturation Ratios: 19 % — ABNORMAL LOW (ref 21–57)
TIBC: 198 ug/dL — ABNORMAL LOW (ref 236–444)
UIBC: 160 ug/dL (ref 120–384)

## 2019-06-11 LAB — FERRITIN: Ferritin: 476 ng/mL — ABNORMAL HIGH (ref 11–307)

## 2019-06-11 NOTE — Telephone Encounter (Signed)
Called and LMVM for patient regarding appointment added   Per 1/11 result note

## 2019-06-15 ENCOUNTER — Inpatient Hospital Stay: Payer: Medicare Other

## 2019-06-15 ENCOUNTER — Other Ambulatory Visit: Payer: Self-pay

## 2019-06-15 VITALS — BP 168/58 | HR 63 | Temp 98.0°F | Resp 18

## 2019-06-15 DIAGNOSIS — C541 Malignant neoplasm of endometrium: Secondary | ICD-10-CM | POA: Diagnosis not present

## 2019-06-15 DIAGNOSIS — D508 Other iron deficiency anemias: Secondary | ICD-10-CM

## 2019-06-15 MED ORDER — SODIUM CHLORIDE 0.9 % IV SOLN
200.0000 mg | Freq: Once | INTRAVENOUS | Status: AC
  Administered 2019-06-15: 14:00:00 200 mg via INTRAVENOUS
  Filled 2019-06-15: qty 200

## 2019-06-15 MED ORDER — SODIUM CHLORIDE 0.9 % IV SOLN
Freq: Once | INTRAVENOUS | Status: DC
  Filled 2019-06-15: qty 250

## 2019-06-15 MED ORDER — HEPARIN SOD (PORK) LOCK FLUSH 100 UNIT/ML IV SOLN
500.0000 [IU] | Freq: Once | INTRAVENOUS | Status: AC
  Administered 2019-06-15: 500 [IU] via INTRAVENOUS
  Filled 2019-06-15: qty 5

## 2019-06-15 MED ORDER — SODIUM CHLORIDE 0.9 % IV SOLN
Freq: Once | INTRAVENOUS | Status: AC
  Filled 2019-06-15: qty 250

## 2019-06-15 MED ORDER — SODIUM CHLORIDE 0.9% FLUSH
10.0000 mL | INTRAVENOUS | Status: DC | PRN
  Administered 2019-06-15: 16:00:00 10 mL via INTRAVENOUS
  Filled 2019-06-15: qty 10

## 2019-06-15 NOTE — Patient Instructions (Signed)
Anemia  Anemia is a condition in which you do not have enough red blood cells or hemoglobin. Hemoglobin is a substance in red blood cells that carries oxygen. When you do not have enough red blood cells or hemoglobin (are anemic), your body cannot get enough oxygen and your organs may not work properly. As a result, you may feel very tired or have other problems. What are the causes? Common causes of anemia include:  Excessive bleeding. Anemia can be caused by excessive bleeding inside or outside the body, including bleeding from the intestine or from periods in women.  Poor nutrition.  Long-lasting (chronic) kidney, thyroid, and liver disease.  Bone marrow disorders.  Cancer and treatments for cancer.  HIV (human immunodeficiency virus) and AIDS (acquired immunodeficiency syndrome).  Treatments for HIV and AIDS.  Spleen problems.  Blood disorders.  Infections, medicines, and autoimmune disorders that destroy red blood cells. What are the signs or symptoms? Symptoms of this condition include:  Minor weakness.  Dizziness.  Headache.  Feeling heartbeats that are irregular or faster than normal (palpitations).  Shortness of breath, especially with exercise.  Paleness.  Cold sensitivity.  Indigestion.  Nausea.  Difficulty sleeping.  Difficulty concentrating. Symptoms may occur suddenly or develop slowly. If your anemia is mild, you may not have symptoms. How is this diagnosed? This condition is diagnosed based on:  Blood tests.  Your medical history.  A physical exam.  Bone marrow biopsy. Your health care provider may also check your stool (feces) for blood and may do additional testing to look for the cause of your bleeding. You may also have other tests, including:  Imaging tests, such as a CT scan or MRI.  Endoscopy.  Colonoscopy. How is this treated? Treatment for this condition depends on the cause. If you continue to lose a lot of blood, you may  need to be treated at a hospital. Treatment may include:  Taking supplements of iron, vitamin S31, or folic acid.  Taking a hormone medicine (erythropoietin) that can help to stimulate red blood cell growth.  Having a blood transfusion. This may be needed if you lose a lot of blood.  Making changes to your diet.  Having surgery to remove your spleen. Follow these instructions at home:  Take over-the-counter and prescription medicines only as told by your health care provider.  Take supplements only as told by your health care provider.  Follow any diet instructions that you were given.  Keep all follow-up visits as told by your health care provider. This is important. Contact a health care provider if:  You develop new bleeding anywhere in the body. Get help right away if:  You are very weak.  You are short of breath.  You have pain in your abdomen or chest.  You are dizzy or feel faint.  You have trouble concentrating.  You have bloody or black, tarry stools.  You vomit repeatedly or you vomit up blood. Summary  Anemia is a condition in which you do not have enough red blood cells or enough of a substance in your red blood cells that carries oxygen (hemoglobin).  Symptoms may occur suddenly or develop slowly.  If your anemia is mild, you may not have symptoms.  This condition is diagnosed with blood tests as well as a medical history and physical exam. Other tests may be needed.  Treatment for this condition depends on the cause of the anemia. This information is not intended to replace advice given to you by  your health care provider. Make sure you discuss any questions you have with your health care provider. Document Revised: 04/29/2017 Document Reviewed: 06/18/2016 Elsevier Patient Education  [employer].

## 2019-06-28 ENCOUNTER — Other Ambulatory Visit: Payer: Self-pay | Admitting: *Deleted

## 2019-06-28 MED ORDER — TRAMADOL HCL 50 MG PO TABS: 100.0000 mg | ORAL_TABLET | Freq: Two times a day (BID) | ORAL | 0 refills | Status: DC

## 2019-07-20 ENCOUNTER — Inpatient Hospital Stay: Payer: Medicare Other

## 2019-07-20 ENCOUNTER — Inpatient Hospital Stay: Payer: Medicare Other | Admitting: Family

## 2019-07-20 ENCOUNTER — Ambulatory Visit: Payer: Medicare Other

## 2019-07-24 ENCOUNTER — Inpatient Hospital Stay: Payer: Medicare Other

## 2019-07-24 ENCOUNTER — Inpatient Hospital Stay: Payer: Medicare Other | Admitting: Family

## 2019-07-27 ENCOUNTER — Other Ambulatory Visit: Payer: Self-pay | Admitting: Hematology & Oncology

## 2019-07-27 ENCOUNTER — Other Ambulatory Visit: Payer: Self-pay | Admitting: *Deleted

## 2019-07-27 MED ORDER — TRAMADOL HCL 50 MG PO TABS: 100.0000 mg | ORAL_TABLET | Freq: Two times a day (BID) | ORAL | 0 refills | Status: DC

## 2019-07-31 ENCOUNTER — Inpatient Hospital Stay: Payer: Medicare Other

## 2019-07-31 ENCOUNTER — Inpatient Hospital Stay: Payer: Medicare Other | Admitting: Family

## 2019-08-02 ENCOUNTER — Inpatient Hospital Stay: Payer: Medicare Other

## 2019-08-02 ENCOUNTER — Inpatient Hospital Stay (HOSPITAL_BASED_OUTPATIENT_CLINIC_OR_DEPARTMENT_OTHER): Payer: Medicare Other | Admitting: Family

## 2019-08-02 ENCOUNTER — Inpatient Hospital Stay: Payer: Medicare Other | Attending: Hematology & Oncology

## 2019-08-02 ENCOUNTER — Other Ambulatory Visit: Payer: Self-pay

## 2019-08-02 ENCOUNTER — Encounter: Payer: Self-pay | Admitting: Family

## 2019-08-02 VITALS — BP 178/48 | HR 73 | Temp 97.3°F | Resp 17 | Ht 60.0 in | Wt 145.4 lb

## 2019-08-02 DIAGNOSIS — M545 Low back pain: Secondary | ICD-10-CM | POA: Insufficient documentation

## 2019-08-02 DIAGNOSIS — C541 Malignant neoplasm of endometrium: Secondary | ICD-10-CM

## 2019-08-02 DIAGNOSIS — R200 Anesthesia of skin: Secondary | ICD-10-CM | POA: Insufficient documentation

## 2019-08-02 DIAGNOSIS — R202 Paresthesia of skin: Secondary | ICD-10-CM | POA: Diagnosis not present

## 2019-08-02 DIAGNOSIS — I890 Lymphedema, not elsewhere classified: Secondary | ICD-10-CM | POA: Diagnosis not present

## 2019-08-02 DIAGNOSIS — C539 Malignant neoplasm of cervix uteri, unspecified: Secondary | ICD-10-CM

## 2019-08-02 DIAGNOSIS — Z79899 Other long term (current) drug therapy: Secondary | ICD-10-CM | POA: Diagnosis not present

## 2019-08-02 DIAGNOSIS — G8929 Other chronic pain: Secondary | ICD-10-CM | POA: Diagnosis not present

## 2019-08-02 DIAGNOSIS — D508 Other iron deficiency anemias: Secondary | ICD-10-CM

## 2019-08-02 DIAGNOSIS — Z5112 Encounter for antineoplastic immunotherapy: Secondary | ICD-10-CM | POA: Diagnosis not present

## 2019-08-02 DIAGNOSIS — B373 Candidiasis of vulva and vagina: Secondary | ICD-10-CM

## 2019-08-02 DIAGNOSIS — R21 Rash and other nonspecific skin eruption: Secondary | ICD-10-CM

## 2019-08-02 DIAGNOSIS — B3731 Acute candidiasis of vulva and vagina: Secondary | ICD-10-CM

## 2019-08-02 DIAGNOSIS — R110 Nausea: Secondary | ICD-10-CM | POA: Diagnosis not present

## 2019-08-02 LAB — CBC WITH DIFFERENTIAL (CANCER CENTER ONLY)
Abs Immature Granulocytes: 0.01 10*3/uL (ref 0.00–0.07)
Basophils Absolute: 0 10*3/uL (ref 0.0–0.1)
Basophils Relative: 1 %
Eosinophils Absolute: 0.2 10*3/uL (ref 0.0–0.5)
Eosinophils Relative: 4 %
HCT: 35.2 % — ABNORMAL LOW (ref 36.0–46.0)
Hemoglobin: 11.6 g/dL — ABNORMAL LOW (ref 12.0–15.0)
Immature Granulocytes: 0 %
Lymphocytes Relative: 11 %
Lymphs Abs: 0.6 10*3/uL — ABNORMAL LOW (ref 0.7–4.0)
MCH: 29.7 pg (ref 26.0–34.0)
MCHC: 33 g/dL (ref 30.0–36.0)
MCV: 90.3 fL (ref 80.0–100.0)
Monocytes Absolute: 0.4 10*3/uL (ref 0.1–1.0)
Monocytes Relative: 7 %
Neutro Abs: 4.1 10*3/uL (ref 1.7–7.7)
Neutrophils Relative %: 77 %
Platelet Count: 249 10*3/uL (ref 150–400)
RBC: 3.9 MIL/uL (ref 3.87–5.11)
RDW: 13.4 % (ref 11.5–15.5)
WBC Count: 5.2 10*3/uL (ref 4.0–10.5)
nRBC: 0 % (ref 0.0–0.2)

## 2019-08-02 LAB — FERRITIN: Ferritin: 561 ng/mL — ABNORMAL HIGH (ref 11–307)

## 2019-08-02 LAB — IRON AND TIBC
Iron: 59 ug/dL (ref 41–142)
Saturation Ratios: 29 % (ref 21–57)
TIBC: 203 ug/dL — ABNORMAL LOW (ref 236–444)
UIBC: 144 ug/dL (ref 120–384)

## 2019-08-02 LAB — CMP (CANCER CENTER ONLY)
ALT: 6 U/L (ref 0–44)
AST: 12 U/L — ABNORMAL LOW (ref 15–41)
Albumin: 4.2 g/dL (ref 3.5–5.0)
Alkaline Phosphatase: 61 U/L (ref 38–126)
Anion gap: 8 (ref 5–15)
BUN: 12 mg/dL (ref 8–23)
CO2: 27 mmol/L (ref 22–32)
Calcium: 9.7 mg/dL (ref 8.9–10.3)
Chloride: 105 mmol/L (ref 98–111)
Creatinine: 0.84 mg/dL (ref 0.44–1.00)
GFR, Est AFR Am: >60 mL/min (ref >60)
GFR, Estimated: >60 mL/min (ref >60)
Glucose, Bld: 102 mg/dL — ABNORMAL HIGH (ref 70–99)
Potassium: 3.7 mmol/L (ref 3.5–5.1)
Sodium: 140 mmol/L (ref 135–145)
Total Bilirubin: 0.3 mg/dL (ref 0.3–1.2)
Total Protein: 7.1 g/dL (ref 6.5–8.1)

## 2019-08-02 MED ORDER — SODIUM CHLORIDE 0.9 % IV SOLN
Freq: Once | INTRAVENOUS | Status: AC
  Filled 2019-08-02: qty 250

## 2019-08-02 MED ORDER — SODIUM CHLORIDE 0.9% FLUSH
10.0000 mL | INTRAVENOUS | Status: DC | PRN
  Administered 2019-08-02: 10 mL
  Filled 2019-08-02: qty 10

## 2019-08-02 MED ORDER — HEPARIN SOD (PORK) LOCK FLUSH 100 UNIT/ML IV SOLN
500.0000 [IU] | Freq: Once | INTRAVENOUS | Status: DC | PRN
  Filled 2019-08-02: qty 5

## 2019-08-02 MED ORDER — SODIUM CHLORIDE 0.9 % IV SOLN
Freq: Once | INTRAVENOUS | Status: DC
  Filled 2019-08-02: qty 250

## 2019-08-02 MED ORDER — ZOLEDRONIC ACID 4 MG/100ML IV SOLN: 4.0000 mg | Freq: Once | INTRAVENOUS | Status: DC

## 2019-08-02 MED ORDER — ZOLEDRONIC ACID 4 MG/5ML IV CONC
3.3000 mg | Freq: Once | INTRAVENOUS | Status: DC
  Filled 2019-08-02: qty 4.13

## 2019-08-02 MED ORDER — HEPARIN SOD (PORK) LOCK FLUSH 100 UNIT/ML IV SOLN
500.0000 [IU] | Freq: Once | INTRAVENOUS | Status: AC | PRN
  Administered 2019-08-02: 500 [IU]
  Filled 2019-08-02: qty 5

## 2019-08-02 MED ORDER — SODIUM CHLORIDE 0.9 % IV SOLN
200.0000 mg | Freq: Once | INTRAVENOUS | Status: AC
  Administered 2019-08-02: 200 mg via INTRAVENOUS
  Filled 2019-08-02: qty 290.91

## 2019-08-02 MED ORDER — SODIUM CHLORIDE 0.9% FLUSH
10.0000 mL | Freq: Once | INTRAVENOUS | Status: DC | PRN
  Filled 2019-08-02: qty 10

## 2019-08-02 MED ORDER — SODIUM CHLORIDE 0.9 % IV SOLN
400.0000 mg | Freq: Once | INTRAVENOUS | Status: AC
  Administered 2019-08-02: 400 mg via INTRAVENOUS
  Filled 2019-08-02: qty 16

## 2019-08-02 NOTE — Progress Notes (Signed)
Hematology and Oncology Follow Up Visit  Shannon Scott [id number] [birth year] 79 y.o. 08/02/2019   Principle Diagnosis:  Locally advanced/recurrent endometrial carcinoma undifferentiated -- TMB (HIGH) / MSI HIGH  Past Therapy: Radiation therapyfor 5 -5 1/2 weeks - s/p 20 fractions Taxol/carboplatinumq 7 days -- s/p cycle 6  Current Therapy:   Pembrolizumab 400 mg IV q 6 wk -- start on 01/23/2019 -- s/p cycle 12of 200 mg q 3 week Zometa 4 mg IV q 3 months -- next dose in 04/2019 IV iron as indicated - last received 11/10/2017   Interim History:  Shannon Scott is here today for follow-up and treatment. She is doing fairly well and states that since receiving Iv iron her energy seems improved.  She still has little red raised bumps sporadic on the arms and legs. They do not itch or burn. No s/s of infection noted on skin exam today.  She has chronic swelling present in both lower extremities, more pronounced in the left lower extremity she states is due to lymphedema. This improves when she elevates her feet. No pitting or redness noted on exam.  No fever, chills, cough, dizziness, SOB, chest pain, palpitations, abdominal pain or changes in bowel or bladder habits.  Her ostomy is functioning appropriately. She states that she is ready to move forward with a PET scan and will then follow-up with Dr. Johney Maine,  No episodes of bleeding. No bruising or petechiae.  She has occasional nausea (no vomiting) and chews ginger gum which helps resolve.  She has chronic lower back pain that waxes and wanes.  She has positions numbness and tingling in her fingertips.  No falls or syncopal episodes to report. She is in a wheel chair today but uses her cane for support at home.  She states that she is eating well and hydrating appropriately. Her weight is stable at 145 lbs.   ECOG Performance Status: 1 - Symptomatic but completely ambulatory  Medications:  Allergies as of 08/02/2019      Reactions    Statins Other (See Comments)   Myalgias and memory problems   Ciprofloxacin Itching   Splotchy redness with itching during IV infusion localized to arm.      Medication List       Accurate as of August 02, 2019  9:50 AM. If you have any questions, ask your nurse or doctor.        amoxicillin 500 MG tablet Commonly known as: AMOXIL Take four tablets prior to dental procedure   diclofenac sodium 1 % Gel Commonly known as: VOLTAREN Apply 2 g topically 4 (four) times daily.   diltiazem 300 MG 24 hr capsule Commonly known as: TIAZAC TAKE 1 CAPSULE(300 MG) BY MOUTH DAILY What changed: See the new instructions.   gabapentin 100 MG capsule Commonly known as: Neurontin Take 1 capsule (100 mg total) by mouth 2 (two) times daily.   ibuprofen 200 MG tablet Commonly known as: ADVIL Take 200 mg by mouth daily as needed (back pain).   lactose free nutrition Liqd Take 237 mLs by mouth 3 (three) times daily with meals.   levothyroxine 50 MCG tablet Commonly known as: SYNTHROID Take 1 tablet (50 mcg total) by mouth daily before breakfast.   lidocaine-prilocaine cream Commonly known as: EMLA Apply 1 application topically as needed.   lipase/protease/amylase 36000 UNITS Cpep capsule Commonly known as: Creon Take 2 capsules (72,000 Units total) by mouth 3 (three) times daily before meals.   methylPREDNISolone 4 MG Tbpk tablet Commonly  known as: MEDROL DOSEPAK Take as directed on packaging.   nystatin cream Commonly known as: MYCOSTATIN Apply 1 application topically 2 (two) times daily.   ondansetron 8 MG tablet Commonly known as: ZOFRAN TAKE 1 TABLET(8 MG) BY MOUTH EVERY 8 HOURS AS NEEDED FOR NAUSEA OR VOMITING   pantoprazole 40 MG tablet Commonly known as: PROTONIX Take 1 tablet (40 mg total) by mouth daily. What changed:   when to take this  reasons to take this   polyethylene glycol 17 g packet Commonly known as: MIRALAX / GLYCOLAX Take 17 g by mouth daily.     prochlorperazine 10 MG tablet Commonly known as: COMPAZINE Take 1 tablet (10 mg total) by mouth every 6 (six) hours as needed for nausea or vomiting.   senna-docusate 8.6-50 MG tablet Commonly known as: Senokot-S Take 1 tablet by mouth 2 (two) times daily.   traMADol 50 MG tablet Commonly known as: ULTRAM Take 2 tablets (100 mg total) by mouth 2 (two) times daily.       Allergies:  Allergies  Allergen Reactions  . Statins Other (See Comments)    Myalgias and memory problems  . Ciprofloxacin Itching    Splotchy redness with itching during IV infusion localized to arm.    Past Medical History, Surgical history, Social history, and Family History were reviewed and updated.  Review of Systems: All other 10 point review of systems is negative.   Physical Exam:  vitals were not taken for this visit.   Wt Readings from Last 3 Encounters:  06/08/19 141 lb 1.3 oz (64 kg)  03/07/19 137 lb (62.1 kg)  02/08/19 134 lb (60.8 kg)    Ocular: Sclerae unicteric, pupils equal, round and reactive to light Ear-nose-throat: Oropharynx clear, dentition fair Lymphatic: No cervical or supraclavicular adenopathy Lungs no rales or rhonchi, good excursion bilaterally Heart regular rate and rhythm, no murmur appreciated Abd soft, nontender, positive bowel sounds, left abdominal ostomy site intact, hernia present, no liver or spleen tip palpated on exam, no fluid wave  MSK no focal spinal tenderness, no joint edema Neuro: non-focal, well-oriented, appropriate affect Breasts: Deferred   Lab Results  Component Value Date   WBC 5.2 08/02/2019   HGB 11.6 (L) 08/02/2019   HCT 35.2 (L) 08/02/2019   MCV 90.3 08/02/2019   PLT 249 08/02/2019   Lab Results  Component Value Date   FERRITIN 476 (H) 06/08/2019   IRON 38 (L) 06/08/2019   TIBC 198 (L) 06/08/2019   UIBC 160 06/08/2019   IRONPCTSAT 19 (L) 06/08/2019   Lab Results  Component Value Date   RETICCTPCT 0.5 (L) 10/20/2017   RBC 3.90  08/02/2019   No results found for: KPAFRELGTCHN, LAMBDASER, KAPLAMBRATIO No results found for: IGGSERUM, IGA, IGMSERUM No results found for: Kathrynn Ducking, MSPIKE, SPEI   Chemistry      Component Value Date/Time   NA 138 06/08/2019 1025   NA 136 (A) 09/08/2017 0000   K 3.9 06/08/2019 1025   CL 103 06/08/2019 1025   CO2 27 06/08/2019 1025   BUN 18 06/08/2019 1025   BUN 8 09/08/2017 0000   CREATININE 0.87 06/08/2019 1025   CREATININE 0.69 08/02/2016 1519   GLU 111 09/08/2017 0000      Component Value Date/Time   CALCIUM 9.9 06/08/2019 1025   ALKPHOS 63 06/08/2019 1025   AST 12 (L) 06/08/2019 1025   ALT 7 06/08/2019 1025   BILITOT 0.4 06/08/2019 1025  Impression and Plan: Shannon Scott is a very pleasant 79 yo caucasian female withrecurrent undifferentiated endometrial carcinoma. We will proceed with treatment today as planned per Dr. Marin Olp. PET scan order placed. We will see her back in another 6 weeks.  She will contact our office with any questions or concerns. We can certainly see her sooner if needed.   Laverna Peace, NP 3/4/20219:50 AM

## 2019-08-02 NOTE — Patient Instructions (Addendum)
Iron Deficiency Anemia, Adult Iron-deficiency anemia is when you have a low amount of red blood cells or hemoglobin. This happens because you have too little iron in your body. Hemoglobin carries oxygen to parts of the body. Anemia can cause your body to not get enough oxygen. It may or may not cause symptoms. Follow these instructions at home: Medicines  Take over-the-counter and prescription medicines only as told by your doctor. This includes iron pills (supplements) and vitamins.  If you cannot handle taking iron pills by mouth, ask your doctor about getting iron through: ? A vein (intravenously). ? A shot (injection) into a muscle.  Take iron pills when your stomach is empty. If you cannot handle this, take them with food.  Do not drink milk or take antacids at the same time as your iron pills.  To prevent trouble pooping (constipation), eat fiber or take medicine (stool softener) as told by your doctor. Eating and drinking   Talk with your doctor before changing the foods you eat. He or she may tell you to eat foods that have a lot of iron, such as: ? Liver. ? Lowfat (lean) beef. ? Breads and cereals that have iron added to them (fortified breads and cereals). ? Eggs. ? Dried fruit. ? Dark green, leafy vegetables.  Drink enough fluid to keep your pee (urine) clear or pale yellow.  Eat fresh fruits and vegetables that are high in vitamin C. They help your body to use iron. Foods with a lot of vitamin C include: ? Oranges. ? Peppers. ? Tomatoes. ? Mangoes. General instructions  Return to your normal activities as told by your doctor. Ask your doctor what activities are safe for you.  Keep yourself clean, and keep things clean around you (your surroundings). Anemia can make you get sick more easily.  Keep all follow-up visits as told by your doctor. This is important. Contact a doctor if:  You feel sick to your stomach (nauseous).  You throw up (vomit).  You feel  weak.  You are sweating for no clear reason.  You have trouble pooping, such as: ? Pooping (having a bowel movement) less than 3 times a week. ? Straining to poop. ? Having poop that is hard, dry, or larger than normal. ? Feeling full or bloated. ? Pain in the lower belly. ? Not feeling better after pooping. Get help right away if:  You pass out (faint). If this happens, do not drive yourself to the hospital. Call your local emergency services (911 in the [place]  You have chest pain.  You have shortness of breath that: ? Is very bad. ? Gets worse with physical activity.  You have a fast heartbeat.  You get light-headed when getting up from sitting or lying down. This information is not intended to replace advice given to you by your health care provider. Make sure you discuss any questions you have with your health care provider. Document Revised: 04/29/2017 Document Reviewed: 02/04/2016 Elsevier Patient Education  [place].   Zoledronic Acid injection (Hypercalcemia, Oncology) What is this medicine? ZOLEDRONIC ACID (ZOE le dron ik AS id) lowers the amount of calcium loss from bone. It is used to treat too much calcium in your blood from cancer. It is also used to prevent complications of cancer that has spread to the bone. This medicine may be used for other purposes; ask your health care provider or pharmacist if you have questions. COMMON BRAND NAME(S): Zometa What should I tell my  health care provider before I take this medicine? They need to know if you have any of these conditions:  aspirin-sensitive asthma  cancer, especially if you are receiving medicines used to treat cancer  dental disease or wear dentures  infection  kidney disease  receiving corticosteroids like dexamethasone or prednisone  an unusual or allergic reaction to zoledronic acid, other medicines, foods, dyes, or preservatives  pregnant or trying to get  pregnant  breast-feeding How should I use this medicine? This medicine is for infusion into a vein. It is given by a health care professional in a hospital or clinic setting. Talk to your pediatrician regarding the use of this medicine in children. Special care may be needed. Overdosage: If you think you have taken too much of this medicine contact a poison control center or emergency room at once. NOTE: This medicine is only for you. Do not share this medicine with others. What if I miss a dose? It is important not to miss your dose. Call your doctor or health care professional if you are unable to keep an appointment. What may interact with this medicine?  certain antibiotics given by injection  NSAIDs, medicines for pain and inflammation, like ibuprofen or naproxen  some diuretics like bumetanide, furosemide  teriparatide  thalidomide This list may not describe all possible interactions. Give your health care provider a list of all the medicines, herbs, non-prescription drugs, or dietary supplements you use. Also tell them if you smoke, drink alcohol, or use illegal drugs. Some items may interact with your medicine. What should I watch for while using this medicine? Visit your doctor or health care professional for regular checkups. It may be some time before you see the benefit from this medicine. Do not stop taking your medicine unless your doctor tells you to. Your doctor may order blood tests or other tests to see how you are doing. Women should inform their doctor if they wish to become pregnant or think they might be pregnant. There is a potential for serious side effects to an unborn child. Talk to your health care professional or pharmacist for more information. You should make sure that you get enough calcium and vitamin D while you are taking this medicine. Discuss the foods you eat and the vitamins you take with your health care professional. Some people who take this medicine  have severe bone, joint, and/or muscle pain. This medicine may also increase your risk for jaw problems or a broken thigh bone. Tell your doctor right away if you have severe pain in your jaw, bones, joints, or muscles. Tell your doctor if you have any pain that does not go away or that gets worse. Tell your dentist and dental surgeon that you are taking this medicine. You should not have major dental surgery while on this medicine. See your dentist to have a dental exam and fix any dental problems before starting this medicine. Take good care of your teeth while on this medicine. Make sure you see your dentist for regular follow-up appointments. What side effects may I notice from receiving this medicine? Side effects that you should report to your doctor or health care professional as soon as possible:  allergic reactions like skin rash, itching or hives, swelling of the face, lips, or tongue  anxiety, confusion, or depression  breathing problems  changes in vision  eye pain  feeling faint or lightheaded, falls  jaw pain, especially after dental work  mouth sores  muscle cramps, stiffness, or  weakness  redness, blistering, peeling or loosening of the skin, including inside the mouth  trouble passing urine or change in the amount of urine Side effects that usually do not require medical attention (report to your doctor or health care professional if they continue or are bothersome):  bone, joint, or muscle pain  constipation  diarrhea  fever  hair loss  irritation at site where injected  loss of appetite  nausea, vomiting  stomach upset  trouble sleeping  trouble swallowing  weak or tired This list may not describe all possible side effects. Call your doctor for medical advice about side effects. You may report side effects to FDA at 1-800-FDA-1088. Where should I keep my medicine? This drug is given in a hospital or clinic and will not be stored at home. NOTE:  This sheet is a summary. It may not cover all possible information. If you have questions about this medicine, talk to your doctor, pharmacist, or health care provider.  2020 Elsevier/Gold Standard (2013-10-13 14:19:39)    Pembrolizumab injection What is this medicine? PEMBROLIZUMAB (pem broe liz ue mab) is a monoclonal antibody. It is used to treat certain types of cancer. This medicine may be used for other purposes; ask your health care provider or pharmacist if you have questions. COMMON BRAND NAME(S): Keytruda What should I tell my health care provider before I take this medicine? They need to know if you have any of these conditions:  diabetes  immune system problems  inflammatory bowel disease  liver disease  lung or breathing disease  lupus  received or scheduled to receive an organ transplant or a stem-cell transplant that uses donor stem cells  an unusual or allergic reaction to pembrolizumab, other medicines, foods, dyes, or preservatives  pregnant or trying to get pregnant  breast-feeding How should I use this medicine? This medicine is for infusion into a vein. It is given by a health care professional in a hospital or clinic setting. A special MedGuide will be given to you before each treatment. Be sure to read this information carefully each time. Talk to your pediatrician regarding the use of this medicine in children. While this drug may be prescribed for children as young as 6 months for selected conditions, precautions do apply. Overdosage: If you think you have taken too much of this medicine contact a poison control center or emergency room at once. NOTE: This medicine is only for you. Do not share this medicine with others. What if I miss a dose? It is important not to miss your dose. Call your doctor or health care professional if you are unable to keep an appointment. What may interact with this medicine? Interactions have not been studied. Give your  health care provider a list of all the medicines, herbs, non-prescription drugs, or dietary supplements you use. Also tell them if you smoke, drink alcohol, or use illegal drugs. Some items may interact with your medicine. This list may not describe all possible interactions. Give your health care provider a list of all the medicines, herbs, non-prescription drugs, or dietary supplements you use. Also tell them if you smoke, drink alcohol, or use illegal drugs. Some items may interact with your medicine. What should I watch for while using this medicine? Your condition will be monitored carefully while you are receiving this medicine. You may need blood work done while you are taking this medicine. Do not become pregnant while taking this medicine or for 4 months after stopping it. Women should  inform their doctor if they wish to become pregnant or think they might be pregnant. There is a potential for serious side effects to an unborn child. Talk to your health care professional or pharmacist for more information. Do not breast-feed an infant while taking this medicine or for 4 months after the last dose. What side effects may I notice from receiving this medicine? Side effects that you should report to your doctor or health care professional as soon as possible:  allergic reactions like skin rash, itching or hives, swelling of the face, lips, or tongue  bloody or black, tarry  breathing problems  changes in vision  chest pain  chills  confusion  constipation  cough  diarrhea  dizziness or feeling faint or lightheaded  fast or irregular heartbeat  fever  flushing  joint pain  low blood counts - this medicine may decrease the number of white blood cells, red blood cells and platelets. You may be at increased risk for infections and bleeding.  muscle pain  muscle weakness  pain, tingling, numbness in the hands or feet  persistent headache  redness, blistering, peeling  or loosening of the skin, including inside the mouth  signs and symptoms of high blood sugar such as dizziness; dry mouth; dry skin; fruity breath; nausea; stomach pain; increased hunger or thirst; increased urination  signs and symptoms of kidney injury like trouble passing urine or change in the amount of urine  signs and symptoms of liver injury like dark urine, light-colored stools, loss of appetite, nausea, right upper belly pain, yellowing of the eyes or skin  sweating  swollen lymph nodes  weight loss Side effects that usually do not require medical attention (report to your doctor or health care professional if they continue or are bothersome):  decreased appetite  hair loss  muscle pain  tiredness This list may not describe all possible side effects. Call your doctor for medical advice about side effects. You may report side effects to FDA at 1-800-FDA-1088. Where should I keep my medicine? This drug is given in a hospital or clinic and will not be stored at home. NOTE: This sheet is a summary. It may not cover all possible information. If you have questions about this medicine, talk to your doctor, pharmacist, or health care provider.  2020 Elsevier/Gold Standard (2019-03-23 18:07:58)

## 2019-08-02 NOTE — Patient Instructions (Signed)

## 2019-08-07 ENCOUNTER — Telehealth: Payer: Self-pay | Admitting: Hematology & Oncology

## 2019-08-07 NOTE — Telephone Encounter (Signed)
PET DENIAL  Reason:  Your request was denied  We've denied the medical services/items listed below requested by you or your doctor:  Positron Emission Tomography (PET) scan and Computed Tomography (CT) scan - an imaging test that allows a doctor to see how organs and tissues are working (PET scan) and a test that uses a computer and a special type of x-ray to take pictures of the inside of the body (CT scan). The CT scan is done before the PET scan, and the scans are done in one complete scanner unit.  Why did we deny your request?  We denied the medical services/items listed above because your records do not show you meet criteria for the requested PET/CT scan.   The reason this request cannot be approved is because: Imaging may not be needed when the requested study would exceed the allowed limit of three scans.   These scans help your doctor to plan further treatment for your type of cancer. Your records show that your doctor has exceeded that limit.   Unfortunately, the request cannot be approved at this time by [facility] or your health plan.

## 2019-08-27 ENCOUNTER — Other Ambulatory Visit: Payer: Self-pay | Admitting: Hematology & Oncology

## 2019-08-28 ENCOUNTER — Telehealth: Payer: Self-pay | Admitting: Hematology & Oncology

## 2019-08-28 ENCOUNTER — Other Ambulatory Visit: Payer: Self-pay | Admitting: *Deleted

## 2019-08-28 MED ORDER — TRAMADOL HCL 50 MG PO TABS: 100.0000 mg | ORAL_TABLET | Freq: Two times a day (BID) | ORAL | 0 refills | Status: DC

## 2019-08-28 NOTE — Telephone Encounter (Signed)
Returned call to patient and advised her that we had cancelled her 4/2 appts.  They were old appts and per 3/30 secure chat from [place] stating OK to cancel these appointments

## 2019-08-29 ENCOUNTER — Other Ambulatory Visit: Payer: Self-pay | Admitting: Hematology & Oncology

## 2019-08-31 ENCOUNTER — Other Ambulatory Visit: Payer: Medicare Other

## 2019-08-31 ENCOUNTER — Ambulatory Visit: Payer: Medicare Other | Admitting: Hematology & Oncology

## 2019-08-31 ENCOUNTER — Ambulatory Visit: Payer: Medicare Other

## 2019-09-07 NOTE — Progress Notes (Signed)
Pharmacist Chemotherapy Monitoring - Follow Up Assessment    I verify that I have reviewed each item in the below checklist:  . Regimen for the patient is scheduled for the appropriate day and plan matches scheduled date. Marland Kitchen Appropriate non-routine labs are ordered dependent on drug ordered. . If applicable, additional medications reviewed and ordered per protocol based on lifetime cumulative doses and/or treatment regimen.   Plan for follow-up and/or issues identified: Yes . I-vent associated with next due treatment: Yes . MD and/or nursing notified: No  Weltha Cathy, Jacqlyn Larsen 09/07/2019 3:29 PM

## 2019-09-11 ENCOUNTER — Other Ambulatory Visit: Payer: Self-pay | Admitting: Hematology & Oncology

## 2019-09-12 ENCOUNTER — Telehealth: Payer: Self-pay | Admitting: Family

## 2019-09-12 ENCOUNTER — Inpatient Hospital Stay (HOSPITAL_BASED_OUTPATIENT_CLINIC_OR_DEPARTMENT_OTHER): Payer: Medicare Other | Admitting: Family

## 2019-09-12 ENCOUNTER — Inpatient Hospital Stay: Payer: Medicare Other

## 2019-09-12 ENCOUNTER — Encounter: Payer: Self-pay | Admitting: Family

## 2019-09-12 ENCOUNTER — Other Ambulatory Visit: Payer: Self-pay

## 2019-09-12 ENCOUNTER — Inpatient Hospital Stay: Payer: Medicare Other | Attending: Hematology & Oncology

## 2019-09-12 VITALS — BP 158/53 | HR 65 | Temp 97.5°F | Resp 18 | Ht 61.0 in | Wt 146.0 lb

## 2019-09-12 DIAGNOSIS — M549 Dorsalgia, unspecified: Secondary | ICD-10-CM | POA: Diagnosis not present

## 2019-09-12 DIAGNOSIS — E039 Hypothyroidism, unspecified: Secondary | ICD-10-CM | POA: Diagnosis not present

## 2019-09-12 DIAGNOSIS — Z923 Personal history of irradiation: Secondary | ICD-10-CM | POA: Diagnosis not present

## 2019-09-12 DIAGNOSIS — Z9221 Personal history of antineoplastic chemotherapy: Secondary | ICD-10-CM | POA: Diagnosis not present

## 2019-09-12 DIAGNOSIS — R202 Paresthesia of skin: Secondary | ICD-10-CM | POA: Diagnosis not present

## 2019-09-12 DIAGNOSIS — C541 Malignant neoplasm of endometrium: Secondary | ICD-10-CM

## 2019-09-12 DIAGNOSIS — M5440 Lumbago with sciatica, unspecified side: Secondary | ICD-10-CM | POA: Diagnosis not present

## 2019-09-12 DIAGNOSIS — M7989 Other specified soft tissue disorders: Secondary | ICD-10-CM | POA: Insufficient documentation

## 2019-09-12 DIAGNOSIS — D508 Other iron deficiency anemias: Secondary | ICD-10-CM

## 2019-09-12 DIAGNOSIS — E032 Hypothyroidism due to medicaments and other exogenous substances: Secondary | ICD-10-CM

## 2019-09-12 LAB — CBC WITH DIFFERENTIAL (CANCER CENTER ONLY)
Abs Immature Granulocytes: 0.01 10*3/uL (ref 0.00–0.07)
Basophils Absolute: 0 10*3/uL (ref 0.0–0.1)
Basophils Relative: 1 %
Eosinophils Absolute: 0.1 10*3/uL (ref 0.0–0.5)
Eosinophils Relative: 2 %
HCT: 34.6 % — ABNORMAL LOW (ref 36.0–46.0)
Hemoglobin: 11.4 g/dL — ABNORMAL LOW (ref 12.0–15.0)
Immature Granulocytes: 0 %
Lymphocytes Relative: 13 %
Lymphs Abs: 0.7 10*3/uL (ref 0.7–4.0)
MCH: 29.6 pg (ref 26.0–34.0)
MCHC: 32.9 g/dL (ref 30.0–36.0)
MCV: 89.9 fL (ref 80.0–100.0)
Monocytes Absolute: 0.3 10*3/uL (ref 0.1–1.0)
Monocytes Relative: 6 %
Neutro Abs: 4 10*3/uL (ref 1.7–7.7)
Neutrophils Relative %: 78 %
Platelet Count: 260 10*3/uL (ref 150–400)
RBC: 3.85 MIL/uL — ABNORMAL LOW (ref 3.87–5.11)
RDW: 13.5 % (ref 11.5–15.5)
WBC Count: 5.2 10*3/uL (ref 4.0–10.5)
nRBC: 0 % (ref 0.0–0.2)

## 2019-09-12 LAB — CMP (CANCER CENTER ONLY)
ALT: 6 U/L (ref 0–44)
AST: 12 U/L — ABNORMAL LOW (ref 15–41)
Albumin: 4.1 g/dL (ref 3.5–5.0)
Alkaline Phosphatase: 63 U/L (ref 38–126)
Anion gap: 8 (ref 5–15)
BUN: 16 mg/dL (ref 8–23)
CO2: 26 mmol/L (ref 22–32)
Calcium: 9.5 mg/dL (ref 8.9–10.3)
Chloride: 105 mmol/L (ref 98–111)
Creatinine: 0.8 mg/dL (ref 0.44–1.00)
GFR, Est AFR Am: >60 mL/min (ref >60)
GFR, Estimated: >60 mL/min (ref >60)
Glucose, Bld: 97 mg/dL (ref 70–99)
Potassium: 4 mmol/L (ref 3.5–5.1)
Sodium: 139 mmol/L (ref 135–145)
Total Bilirubin: 0.4 mg/dL (ref 0.3–1.2)
Total Protein: 6.7 g/dL (ref 6.5–8.1)

## 2019-09-12 MED ORDER — SODIUM CHLORIDE 0.9 % IV SOLN
Freq: Once | INTRAVENOUS | Status: AC
  Filled 2019-09-12: qty 250

## 2019-09-12 MED ORDER — HEPARIN SOD (PORK) LOCK FLUSH 100 UNIT/ML IV SOLN
500.0000 [IU] | Freq: Once | INTRAVENOUS | Status: AC | PRN
  Administered 2019-09-12: 500 [IU]
  Filled 2019-09-12: qty 5

## 2019-09-12 MED ORDER — SODIUM CHLORIDE 0.9% FLUSH
10.0000 mL | INTRAVENOUS | Status: DC | PRN
  Administered 2019-09-12: 10 mL
  Filled 2019-09-12: qty 10

## 2019-09-12 MED ORDER — SODIUM CHLORIDE 0.9 % IV SOLN
400.0000 mg | Freq: Once | INTRAVENOUS | Status: AC
  Administered 2019-09-12: 400 mg via INTRAVENOUS
  Filled 2019-09-12: qty 16

## 2019-09-12 NOTE — Telephone Encounter (Signed)
Appointments scheduled calendar printed per 4/14 los

## 2019-09-12 NOTE — Progress Notes (Signed)
na

## 2019-09-12 NOTE — Patient Instructions (Signed)
Pembrolizumab injection What is this medicine? PEMBROLIZUMAB (pem broe liz ue mab) is a monoclonal antibody. It is used to treat certain types of cancer. This medicine may be used for other purposes; ask your health care provider or pharmacist if you have questions. COMMON BRAND NAME(S): Keytruda What should I tell my health care provider before I take this medicine? They need to know if you have any of these conditions:  diabetes  immune system problems  inflammatory bowel disease  liver disease  lung or breathing disease  lupus  received or scheduled to receive an organ transplant or a stem-cell transplant that uses donor stem cells  an unusual or allergic reaction to pembrolizumab, other medicines, foods, dyes, or preservatives  pregnant or trying to get pregnant  breast-feeding How should I use this medicine? This medicine is for infusion into a vein. It is given by a health care professional in a hospital or clinic setting. A special MedGuide will be given to you before each treatment. Be sure to read this information carefully each time. Talk to your pediatrician regarding the use of this medicine in children. While this drug may be prescribed for children as young as 6 months for selected conditions, precautions do apply. Overdosage: If you think you have taken too much of this medicine contact a poison control center or emergency room at once. NOTE: This medicine is only for you. Do not share this medicine with others. What if I miss a dose? It is important not to miss your dose. Call your doctor or health care professional if you are unable to keep an appointment. What may interact with this medicine? Interactions have not been studied. Give your health care provider a list of all the medicines, herbs, non-prescription drugs, or dietary supplements you use. Also tell them if you smoke, drink alcohol, or use illegal drugs. Some items may interact with your medicine. This  list may not describe all possible interactions. Give your health care provider a list of all the medicines, herbs, non-prescription drugs, or dietary supplements you use. Also tell them if you smoke, drink alcohol, or use illegal drugs. Some items may interact with your medicine. What should I watch for while using this medicine? Your condition will be monitored carefully while you are receiving this medicine. You may need blood work done while you are taking this medicine. Do not become pregnant while taking this medicine or for 4 months after stopping it. Women should inform their doctor if they wish to become pregnant or think they might be pregnant. There is a potential for serious side effects to an unborn child. Talk to your health care professional or pharmacist for more information. Do not breast-feed an infant while taking this medicine or for 4 months after the last dose. What side effects may I notice from receiving this medicine? Side effects that you should report to your doctor or health care professional as soon as possible:  allergic reactions like skin rash, itching or hives, swelling of the face, lips, or tongue  bloody or black, tarry  breathing problems  changes in vision  chest pain  chills  confusion  constipation  cough  diarrhea  dizziness or feeling faint or lightheaded  fast or irregular heartbeat  fever  flushing  joint pain  low blood counts - this medicine may decrease the number of white blood cells, red blood cells and platelets. You may be at increased risk for infections and bleeding.  muscle pain  muscle  weakness  pain, tingling, numbness in the hands or feet  persistent headache  redness, blistering, peeling or loosening of the skin, including inside the mouth  signs and symptoms of high blood sugar such as dizziness; dry mouth; dry skin; fruity breath; nausea; stomach pain; increased hunger or thirst; increased urination  signs  and symptoms of kidney injury like trouble passing urine or change in the amount of urine  signs and symptoms of liver injury like dark urine, light-colored stools, loss of appetite, nausea, right upper belly pain, yellowing of the eyes or skin  sweating  swollen lymph nodes  weight loss Side effects that usually do not require medical attention (report to your doctor or health care professional if they continue or are bothersome):  decreased appetite  hair loss  muscle pain  tiredness This list may not describe all possible side effects. Call your doctor for medical advice about side effects. You may report side effects to FDA at 1-800-FDA-1088. Where should I keep my medicine? This drug is given in a hospital or clinic and will not be stored at home. NOTE: This sheet is a summary. It may not cover all possible information. If you have questions about this medicine, talk to your doctor, pharmacist, or health care provider.  2020 Elsevier/Gold Standard (2019-03-23 18:07:58)

## 2019-09-12 NOTE — Addendum Note (Signed)
Addended by: Burney Gauze R on: 09/12/2019 12:14 PM   Modules accepted: Orders

## 2019-09-12 NOTE — Progress Notes (Signed)
Hematology and Oncology Follow Up Visit  Shannon Scott [id number] 06-26-1940 80 y.o. 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 She is doing well but is having a lot of lower back and hip pain.  Insurance denied her PET scan so we will order CT scans on her.  The led maculopapular rash on her arms and chest appears to almost completely resolved.  No fever, chills, n/v, cough, dizziness, SOB, chest pain, palpitations, abdominal pain or changes in bowel or bladder habits.  The chronic swelling in both lower extremities is stable/unchanged. She has been wrapping her legs with ACE wraps as this is easier for her and better tolerated than compression stockings. No tenderness, numbness or tingling in her extremities at this time. She will occasionally have positional tingling in her fingertips that resolves as soon as she changes position.  No falls or syncopal episodes to report. She does ambulate some around her house for exercise but it has been hard with her hip and back pain.  No episodes of bleeding. No bruising or petechiae.  She has maintained a good appetite and is staying well hydrated. Her weight is stable.   ECOG Performance Status: 2 - Symptomatic, <50% confined to bed  Medications:  Allergies as of 09/12/2019      Reactions   Statins Other (See Comments)   Myalgias and memory problems   Ciprofloxacin Itching   Splotchy redness with itching during IV infusion localized to arm.      Medication List       Accurate as  of September 12, 2019 11:33 AM. If you have any questions, ask your nurse or doctor.        amoxicillin 500 MG tablet Commonly known as: AMOXIL Take four tablets prior to dental procedure   diclofenac sodium 1 % Gel Commonly known as: VOLTAREN Apply 2 g topically 4 (four) times daily.   diltiazem 300 MG 24 hr capsule Commonly known as: TIAZAC TAKE 1 CAPSULE(300 MG) BY MOUTH DAILY What changed: See the new instructions.   gabapentin 100 MG capsule Commonly known as: Neurontin Take 1 capsule (100 mg total) by mouth 2 (two) times daily.   ibuprofen 200 MG tablet Commonly known as: ADVIL Take 200 mg by mouth daily as needed (back pain).   lactose free nutrition Liqd Take 237 mLs by mouth 3 (three) times daily with meals.   levothyroxine 50 MCG tablet Commonly known as: SYNTHROID Take 1 tablet (50 mcg total) by mouth daily before breakfast.   lidocaine-prilocaine cream Commonly known as: EMLA Apply 1 application topically as needed.   lipase/protease/amylase 36000 UNITS Cpep capsule Commonly known as: Creon Take 2 capsules (72,000 Units total) by mouth 3 (three) times daily before meals.   nystatin cream Commonly known as: MYCOSTATIN Apply 1 application topically 2 (two) times daily.   ondansetron 8 MG tablet Commonly known as: ZOFRAN TAKE 1 TABLET(8 MG) BY MOUTH EVERY 8 HOURS AS NEEDED FOR NAUSEA OR VOMITING   pantoprazole  40 MG tablet Commonly known as: PROTONIX Take 1 tablet (40 mg total) by mouth daily. What changed:   when to take this  reasons to take this   polyethylene glycol 17 g packet Commonly known as: MIRALAX / GLYCOLAX Take 17 g by mouth daily.   prochlorperazine 10 MG tablet Commonly known as: COMPAZINE Take 1 tablet (10 mg total) by mouth every 6 (six) hours as needed for nausea or vomiting.   senna-docusate 8.6-50 MG tablet Commonly known as: Senokot-S Take 1 tablet by mouth 2 (two) times daily.   traMADol 50 MG tablet Commonly known as:  ULTRAM TAKE 2 TABLETS(100 MG) BY MOUTH TWICE DAILY       Allergies:  Allergies  Allergen Reactions  . Statins Other (See Comments)    Myalgias and memory problems  . Ciprofloxacin Itching    Splotchy redness with itching during IV infusion localized to arm.    Past Medical History, Surgical history, Social history, and Family History were reviewed and updated.  Review of Systems: All other 10 point review of systems is negative.   Physical Exam:  height is _0  (1.549 m) and weight is 146 lb (66.2 kg). Her temporal temperature is 97.5 F (36.4 C) (abnormal). Her blood pressure is 158/53 (abnormal) and her pulse is 65. Her respiration is 18 and oxygen saturation is 96%.   Wt Readings from Last 3 Encounters:  09/12/19 146 lb (66.2 kg)  09/12/19 146 lb (66.2 kg)  08/02/19 145 lb 6.4 oz (66 kg)    Ocular: Sclerae unicteric, pupils equal, round and reactive to light Ear-nose-throat: Oropharynx clear, dentition fair Lymphatic: No cervical or supraclavicular adenopathy Lungs no rales or rhonchi, good excursion bilaterally Heart regular rate and rhythm, no murmur appreciated Abd soft, nontender, positive bowel sounds, ostomy intact and functioning properly, hernia present, no liver or spleen tip palpated on exam, no fluid wave  MSK no focal spinal tenderness, no joint edema Neuro: non-focal, well-oriented, appropriate affect Breasts: Deferred   Lab Results  Component Value Date   WBC 5.2 09/12/2019   HGB 11.4 (L) 09/12/2019   HCT 34.6 (L) 09/12/2019   MCV 89.9 09/12/2019   PLT 260 09/12/2019   Lab Results  Component Value Date   FERRITIN 561 (H) 08/02/2019   IRON 59 08/02/2019   TIBC 203 (L) 08/02/2019   UIBC 144 08/02/2019   IRONPCTSAT 29 08/02/2019   Lab Results  Component Value Date   RETICCTPCT 0.5 (L) 10/20/2017   RBC 3.85 (L) 09/12/2019   No results found for: KPAFRELGTCHN, LAMBDASER, KAPLAMBRATIO No results found for: IGGSERUM, IGA, IGMSERUM No results  found for: Odetta Pink, SPEI   Chemistry      Component Value Date/Time   NA 140 08/02/2019 0935   NA 136 (A) 09/08/2017 0000   K 3.7 08/02/2019 0935   CL 105 08/02/2019 0935   CO2 27 08/02/2019 0935   BUN 12 08/02/2019 0935   BUN 8 09/08/2017 0000   CREATININE 0.84 08/02/2019 0935   CREATININE 0.69 08/02/2016 1519   GLU 111 09/08/2017 0000      Component Value Date/Time   CALCIUM 9.7 08/02/2019 0935   ALKPHOS 61 08/02/2019 0935   AST 12 (L) 08/02/2019 0935   ALT 6 08/02/2019 0935   BILITOT 0.3 08/02/2019 0935       Impression and Plan: Ms. Toro is a very pleasant 79 yo caucasian female withrecurrent undifferentiated endometrial carcinoma. We will proceed with treatment  today as planned per Dr. Marin Olp. We will get CT scans next week to evaluate her response to treatment.  We will then see her back in 6 weeks.  She will contact our office with any questions or concerns. We can certainly see her sooner if needed.   Laverna Peace, NP 4/14/202111:33 AM

## 2019-09-13 ENCOUNTER — Ambulatory Visit: Payer: Medicare Other

## 2019-09-13 ENCOUNTER — Other Ambulatory Visit: Payer: Medicare Other

## 2019-09-13 ENCOUNTER — Ambulatory Visit: Payer: Medicare Other | Admitting: Hematology & Oncology

## 2019-09-13 LAB — IRON AND TIBC
Iron: 44 ug/dL (ref 41–142)
Saturation Ratios: 23 % (ref 21–57)
TIBC: 188 ug/dL — ABNORMAL LOW (ref 236–444)
UIBC: 144 ug/dL (ref 120–384)

## 2019-09-13 LAB — FERRITIN: Ferritin: 538 ng/mL — ABNORMAL HIGH (ref 11–307)

## 2019-09-17 ENCOUNTER — Encounter: Payer: Self-pay | Admitting: Internal Medicine

## 2019-09-17 ENCOUNTER — Other Ambulatory Visit: Payer: Self-pay

## 2019-09-17 ENCOUNTER — Ambulatory Visit (INDEPENDENT_AMBULATORY_CARE_PROVIDER_SITE_OTHER): Payer: Medicare Other | Admitting: Internal Medicine

## 2019-09-17 ENCOUNTER — Other Ambulatory Visit: Payer: Self-pay | Admitting: Family

## 2019-09-17 VITALS — BP 148/64 | HR 62 | Temp 97.2°F | Ht 61.0 in | Wt 145.0 lb

## 2019-09-17 DIAGNOSIS — I2510 Atherosclerotic heart disease of native coronary artery without angina pectoris: Secondary | ICD-10-CM

## 2019-09-17 DIAGNOSIS — E7801 Familial hypercholesterolemia: Secondary | ICD-10-CM

## 2019-09-17 DIAGNOSIS — I350 Nonrheumatic aortic (valve) stenosis: Secondary | ICD-10-CM

## 2019-09-17 DIAGNOSIS — Z951 Presence of aortocoronary bypass graft: Secondary | ICD-10-CM

## 2019-09-17 NOTE — Patient Instructions (Addendum)
Medication Instructions:  Your physician recommends that you continue on your current medications as directed. Please refer to the Current Medication list given to you today.  *If you need a refill on your cardiac medications before your next appointment, please call your pharmacy*   Lab Work: FASTING lab work to check cholesterol   If you have labs (blood work) drawn today and your tests are completely normal, you will receive your results only by: Marland Kitchen MyChart Message (if you have MyChart) OR . A paper copy in the mail If you have any lab test that is abnormal or we need to change your treatment, we will call you to review the results.   Testing/Procedures: Echocardiogram @ [address] Floor   Follow-Up: At [facility], you and your health needs are our priority.  As part of our continuing mission to provide you with exceptional heart care, we have created designated Provider Care Teams.  These Care Teams include your primary Cardiologist (physician) and Advanced Practice Providers (APPs -  Physician Assistants and Nurse Practitioners) who all work together to provide you with the care you need, when you need it.  We recommend signing up for the patient portal called "MyChart".  Sign up information is provided on this After Visit Summary.  MyChart is used to connect with patients for Virtual Visits (Telemedicine).  Patients are able to view lab/test results, encounter notes, upcoming appointments, etc.  Non-urgent messages can be sent to your provider as well.   To learn more about what you can do with MyChart, go to [email or web address].    Your next appointment:   6 month(s)  The format for your next appointment:   In Person  Provider:   You may see Pixie Casino, MD or one of the following Advanced Practice Providers on your designated Care Team:    Almyra Deforest, PA-C  Fabian Sharp, PA-C or   Roby Lofts, [place]    Other Instructions  Inclisarin - drug  trial

## 2019-09-17 NOTE — Progress Notes (Signed)
OFFICE NOTE  Chief Complaint:  Follow-up dyslipidemia, HTN  Primary Care Physician: Leeroy Cha, MD  HPI:  Shannon Scott is a 79 year old female with severe combined dyslipidemia with CABG in 2005 who has been intolerant to statins, Welchol, niacin, Zetia, and other cholesterol-lower medications. In the past I had sent her to Dr. Moshe Cipro at [facility], who is a lipidologist, for evaluation for apheresis. However, due to the cost of close to $1000 a month she felt that was intolerable. Recently she was complaining of neck pain, underwent carotid Dopplers that you ordered, and those demonstrated 40% to 59% right internal carotid stenosis and a 60% to 79% left internal carotid stenosis. She does have a left carotid bruit. In addition, she is describing a band-like pressure around her chest which she attributed to her blood pressure medicine and then had actually discontinued that; however, the symptoms still persist. This is a little bit worse with exertion, does relieve with rest or taking blood pressure medicine, and is concerning for angina. She's also complaining of some left-sided numbness facial tingling which could be TIA. She has not been taking aspirin and she reports developing what sounds like petechiae on that, but is also not willing to take Plavix or other antiplatelet medications. She does also report pain center called for her body and her shoulders and her knees and her legs as well.  Shannon Scott returns today for follow-up. It's been about a year and a half since I seen her. She does have a history of multivessel coronary disease and prior bypass in 2005. Her last stress test was in 2013 which was negative for ischemia. An echo in 2008 showed probable mild aortic stenosis. She does have a murmur of aortic stenosis today which may be a little louder. Blood pressure is also poorly controlled today however she said she did not take her medicine. She says that she is taking  only a half of her herbs certain pill as it causes her more swelling if she takes the whole pill, despite the fact that contains a diuretic. She's been very intolerant to almost all medications including all of the statins and Zetia. No medicines of been tolerated and she continues to have a very high cholesterol over 300. I do think she be a good candidate for a PC SK 9 inhibitor.  Shannon Scott returns today for follow-up. She is recently started on BiDil and report some mild improvement in blood pressure although she feels when her blood pressure gets low that she is symptomatic. She may have some degree of lack of cerebral autoregulation due to long-standing hypertension. Unfortunate she's been intolerant to all medications. She's been tried on all of the statin medications, WelChol, niacin and steady and has been intolerant. I referred her to [facility] in 2013 for apheresis, however due to the very high cost this was never pursued. With the recent approval of PC SK 9 inhibitors, I think this would be a very good option for her. She has known ASCVD and FH based on her lipid profile. Her most recent cholesterol demonstrated total cholesterol 401, triglycerides 91, HDL 50, and LDL 333. Based on these findings her Dutch score is 8.  08/02/2016  Shannon Scott returns today for follow-up. Although she is asymptomatic, she still has significant untreated risk factors. Most notably she likely has FH. Her brother died recently suddenly, which is suspicious for cardiovascular disease. She does have 2 children, however who apparently do not have any significant cholesterol abnormalities.  She does also have a parent to had premature coronary artery disease. Her most recent LDL cholesterol was over 300 but has not been checked in a number of years. She's been intolerant to all statins, Zetia and WelChol. She previously been referred for lipoprotein apheresis however did not make that appointment due to the  cost.  11/18/2016  Shannon Scott was seen today again in follow-up. She underwent recent lipid testing which again is consistent with either FH or possibly an autosomal recessive LDL receptor defect or gain of function mutation and PCSK9. LDL particle numbers remain elevated at 2191, despite this she has an A pattern. Total cholesterol 333 with LDL-C 257 and HDL-C 56. Non-HDL cholesterol of 277. She is speaking with our pharmacist about starting Redpath but reports that she is not interested in medication. She also says she does not think she can afford the medication and can barely pay for house meds. Blood pressure remains not at goal today. She reports pain short of breath and having lower extremity swelling. Recently doxazosin was added at night - but she's not noted any significant difference in her blood pressure.  06/03/2017  Shannon Scott returns today for follow-up of hypertension and dyslipidemia.  As previously mentioned she has FH with significantly elevated LDL greater than 250 -given her premature CAD, family history of early CAD, very high LDL, apo B and LpA numbers, she may have HoFH.  She was approved for treatment with Repatha, however is afraid of needles and is not interested in that therapy.  There is potentially one other option for her and that would be Juxtapid. Clinical profile certainly fits HoFH.  This is the only oral therapy on the market for familial hyperlipidemia.  I believe this certainly could be an option for her, however it has its own side effect profile.  We will certainly need to monitor her liver function closely as specified through the [facility] proram.  Blood pressure is again noted to be elevated today.  She says this may be related to problems with a left hip bursitis.   09/18/2019  Shannon Scott returns today for follow-up.  Overall she is without any significant complaints.  She has continued to struggle with cancer and is still undergoing some chemotherapy.  She ultimately had to  have colostomy and is not clear whether that would be reversed.  She has been uninterested in any lipid lowering medications particular PCSK9 inhibitors and is been statin intolerant.  We discussed some other options however she was hesitant to consider them.  Today we also discussed the possibility of enrolling her in a clinical trial.  She may very well qualify for [facility] for, which is a study of inclisiran, a short interfering RNA to PCSK9.   PMHx:  Past Medical History:  Diagnosis Date  . Arthritis   . Asthma    allergy induced  . Bilateral carotid artery disease (HCC)    L carotid bruit  . Bursitis    left hip  . CAD (coronary artery disease)   . Cancer ([place])   . Dyslipidemia    intolerant to statins, welchol, niacin, zetia  . Goals of care, counseling/discussion 10/11/2017  . History of blood transfusion   . History of nuclear stress test 04/24/2012   lexiscan; normal study  . Hypertension   . Hypothyroidism   . 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 ([place]) 10/11/2017  . Postoperative nausea and vomiting 01/02/2016    Past Surgical History:  Procedure Laterality  Date  . ABDOMINAL HYSTERECTOMY    . Carotid Doppler  02/2012   40-59% right int carotid artery stenosis; 60-79% L int carotid stenosis; L carotid bruit  . CORONARY ARTERY BYPASS GRAFT  03/12/2004   LIMA to LAD, SVG to circumflex, SVG to PDA  . CYSTOSCOPY W/ URETERAL STENT PLACEMENT Left 12/06/2017   Procedure: CYSTOSCOPY WITH LEFT URETERAL STENT EXCHANGE;  Surgeon: Irine Seal, MD;  Location: [facility];  Service: Urology;  Laterality: Left;  . CYSTOSCOPY WITH STENT PLACEMENT Bilateral 08/21/2017   Procedure: CYSTOSCOPY WITH STENT PLACEMENT;  Surgeon: Ceasar Mons, MD;  Location: [facility];  Service: Urology;  Laterality: Bilateral;  . IR FLUORO GUIDE PORT INSERTION RIGHT  10/11/2017  . IR GENERIC HISTORICAL  04/29/2016   IR RADIOLOGIST EVAL & MGMT 04/29/2016 Sandi Mariscal, MD  [facility]  . IR GENERIC HISTORICAL  05/12/2016   IR RADIOLOGIST EVAL & MGMT 05/12/2016 Sandi Mariscal, MD [facility]  . IR IVC FILTER PLMT / S&I /IMG GUID/MOD SED  10/11/2017  . IR US GUIDE VASC ACCESS RIGHT  10/11/2017  . ROBOTIC ASSISTED TOTAL HYSTERECTOMY WITH BILATERAL SALPINGO OOPHERECTOMY Bilateral 12/30/2015   Procedure: XI ROBOTIC ASSISTED TOTAL HYSTERECTOMY WITH BILATERAL SALPINGO OOPHORECTOMY WITH SENTAL LYMPH NODE BIOPSY;  Surgeon: Nancy Marus, MD;  Location: [facility];  Service: Gynecology;  Laterality: Bilateral;  . TONSILLECTOMY    . TRANSTHORACIC ECHOCARDIOGRAM  04/2007   EF>55%; mild MR; mild-mod TR; mild pulm HTN; mild calcification of aortiv valve leaflets with mild valvular aortic stenosis  . TUBAL LIGATION      FAMHx:  Family History  Problem Relation Age of Onset  . Hypertension Mother   . Heart disease Mother        Died in her 53s  . Stroke Father   . Kidney disease Brother   . Heart disease Brother        also HTN, hyperlipidemia  . Heart attack Brother   . Stroke Sister        x2  . Hypertension Sister     SOCHx:   reports that she has never smoked. She has never used smokeless tobacco. She reports that she does not drink alcohol or use drugs.  ALLERGIES:  Allergies  Allergen Reactions  . Statins Other (See Comments)    Myalgias and memory problems  . Ciprofloxacin Itching    Splotchy redness with itching during IV infusion localized to arm.    ROS: Pertinent items noted in HPI and remainder of comprehensive ROS otherwise negative.  HOME MEDS: Current Outpatient Medications  Medication Sig Dispense Refill  . amoxicillin (AMOXIL) 500 MG tablet Take four tablets prior to dental procedure (Patient not taking: Reported on 01/03/2019) 4 tablet 0  . diclofenac sodium (VOLTAREN) 1 % GEL Apply 2 g topically 4 (four) times daily.    Marland Kitchen diltiazem (TIAZAC) 300 MG 24 hr capsule TAKE 1 CAPSULE(300 MG) BY MOUTH DAILY (Patient taking differently: Take 300  mg by mouth daily. ) 30 capsule 1  . gabapentin (NEURONTIN) 100 MG capsule Take 1 capsule (100 mg total) by mouth 2 (two) times daily. 60 capsule 3  . ibuprofen (ADVIL,MOTRIN) 200 MG tablet Take 200 mg by mouth daily as needed (back pain).    Marland Kitchen lactose free nutrition (BOOST PLUS) LIQD Take 237 mLs by mouth 3 (three) times daily with meals.  0  . levothyroxine (SYNTHROID, LEVOTHROID) 50 MCG tablet Take 1 tablet (50 mcg total) by mouth daily before breakfast. 30 tablet  1  . lidocaine-prilocaine (EMLA) cream Apply 1 application topically as needed. 30 g 6  . lipase/protease/amylase (CREON) 36000 UNITS CPEP capsule Take 2 capsules (72,000 Units total) by mouth 3 (three) times daily before meals. 180 capsule 6  . nystatin cream (MYCOSTATIN) Apply 1 application topically 2 (two) times daily. 30 g 1  . ondansetron (ZOFRAN) 8 MG tablet TAKE 1 TABLET(8 MG) BY MOUTH EVERY 8 HOURS AS NEEDED FOR NAUSEA OR VOMITING 30 tablet 0  . pantoprazole (PROTONIX) 40 MG tablet Take 1 tablet (40 mg total) by mouth daily. (Patient taking differently: Take 40 mg by mouth daily as needed (acid reflux). ) 30 tablet 0  . polyethylene glycol (MIRALAX / GLYCOLAX) packet Take 17 g by mouth daily. 30 each 0  . prochlorperazine (COMPAZINE) 10 MG tablet Take 1 tablet (10 mg total) by mouth every 6 (six) hours as needed for nausea or vomiting. 30 tablet 3  . senna-docusate (SENOKOT-S) 8.6-50 MG tablet Take 1 tablet by mouth 2 (two) times daily.    . traMADol (ULTRAM) 50 MG tablet TAKE 2 TABLETS(100 MG) BY MOUTH TWICE DAILY 60 tablet 0   No current facility-administered medications for this visit.    LABS/IMAGING: No results found for this or any previous visit (from the past 48 hour(s)). No results found.  VITALS: BP (!) 148/64   Pulse 62   Temp (!) 97.2 F (36.2 C)   Ht 5' 1"  (1.549 m)   Wt 145 lb (65.8 kg)   SpO2 98%   BMI 27.40 kg/m   EXAM: General appearance: alert and no distress Neck: no adenopathy, + left  carotid bruit, no JVD, supple, symmetrical, trachea midline and thyroid not enlarged, symmetric, no tenderness/mass/nodules Lungs: clear to auscultation bilaterally Heart: regular rate and rhythm, S1, S2 normal, no murmur, click, rub or gallop Abdomen: soft, non-tender; bowel sounds normal; no masses,  no organomegaly Extremities: extremities normal, atraumatic, no cyanosis or edema Pulses: 2+ and symmetric Skin: Skin color, texture, turgor normal. No rashes or lesions Neurologic: Grossly normal  EKG: Normal sinus rhythm at 62, moderate voltage criteria for LVH-personally reviewed  ASSESSMENT: 1. Familial hypercholesterolemia (possibly HoFH) with a [place] score of 8, also consider LDL adapter protein deficiency or GOF mutation in PCSK9 2. History of TIAs 3. Multidrug intolerance - including statins, welchol, zetia, etc, but also less than expected response to therapy  3.   CABG x 3 (2005) 4.   Known moderate to severe bilateral carotid artery disease  5.   Uncontrolled hypertension 6.   Aortic stenosis (LPa - 123), ApoB of 175 7.  Advanced and recurrent endometrial cancer 8.  Bowel obstruction status post ostomy  PLAN: 1.   [name] continues to struggle with chemotherapy although says she is feeling better.  She has a history of significant coronary disease with prior bypass and likely familial hyperlipidemia.  Unfortunately she has been statin intolerant but is also hesitant to take PCSK9 inhibitor.  We discussed the new medication inclisiran.  This is still in clinical trials but may be available later this year.  She said she would consider the possibility that clinical trial or utilizing medication would becomes available.  Finally she does have an elevated LP(a) and she could also potentially be a candidate for a clinical trial directed at that.  Follow-up with me in 6 months or sooner as necessary.  Pixie Casino, MD, [facility], [place]  Director of the Advanced  Lipid Disorders &  Cardiovascular Risk Reduction Clinic Diplomate of the American Board of Clinical Lipidology Attending Cardiologist  Direct Dial: [phone]  Fax: [phone]  Website:  [email or web address]  Nadean Corwin Karyna Bessler 09/17/2019, 2:28 PM

## 2019-09-18 ENCOUNTER — Encounter: Payer: Self-pay | Admitting: Internal Medicine

## 2019-09-20 ENCOUNTER — Encounter: Payer: Self-pay | Admitting: Family

## 2019-09-20 ENCOUNTER — Other Ambulatory Visit: Payer: Self-pay | Admitting: *Deleted

## 2019-09-24 ENCOUNTER — Other Ambulatory Visit: Payer: Self-pay | Admitting: Hematology & Oncology

## 2019-09-27 ENCOUNTER — Ambulatory Visit (HOSPITAL_BASED_OUTPATIENT_CLINIC_OR_DEPARTMENT_OTHER)
Admission: RE | Admit: 2019-09-27 | Discharge: 2019-09-27 | Disposition: A | Discharge status: Home / Self Care | Payer: Medicare Other | Source: Ambulatory Visit | Attending: Family | Admitting: Family

## 2019-09-27 ENCOUNTER — Other Ambulatory Visit: Payer: Self-pay

## 2019-09-27 ENCOUNTER — Encounter (HOSPITAL_BASED_OUTPATIENT_CLINIC_OR_DEPARTMENT_OTHER): Payer: Self-pay

## 2019-09-27 DIAGNOSIS — M5440 Lumbago with sciatica, unspecified side: Secondary | ICD-10-CM | POA: Insufficient documentation

## 2019-09-27 DIAGNOSIS — C541 Malignant neoplasm of endometrium: Secondary | ICD-10-CM | POA: Diagnosis not present

## 2019-09-27 MED ORDER — IOHEXOL 300 MG/ML  SOLN
100.0000 mL | Freq: Once | INTRAMUSCULAR | Status: AC | PRN
  Administered 2019-09-27: 100 mL via INTRAVENOUS

## 2019-10-02 ENCOUNTER — Telehealth: Payer: Self-pay | Admitting: *Deleted

## 2019-10-02 NOTE — Telephone Encounter (Signed)
-----   Message from Volanda Napoleon, MD sent at 10/02/2019  9:34 AM EDT ----- Call - the CT scan looks great!!!  No cancer is growing!!  Laurey Arrow

## 2019-10-02 NOTE — Telephone Encounter (Signed)
Pt notified per order of Dr. Marin Olp that "the CT scan looks great!!!  No cancer is growing!!  Presenter, broadcasting of call and has no questions or concerns at this time.

## 2019-10-04 ENCOUNTER — Other Ambulatory Visit: Payer: Self-pay

## 2019-10-04 ENCOUNTER — Ambulatory Visit (HOSPITAL_COMMUNITY): Payer: Medicare Other | Attending: Cardiovascular Disease

## 2019-10-04 DIAGNOSIS — I350 Nonrheumatic aortic (valve) stenosis: Secondary | ICD-10-CM | POA: Diagnosis not present

## 2019-10-24 ENCOUNTER — Other Ambulatory Visit: Payer: Self-pay

## 2019-10-24 ENCOUNTER — Inpatient Hospital Stay (HOSPITAL_BASED_OUTPATIENT_CLINIC_OR_DEPARTMENT_OTHER): Payer: Medicare Other | Admitting: Family

## 2019-10-24 ENCOUNTER — Inpatient Hospital Stay: Payer: Medicare Other

## 2019-10-24 ENCOUNTER — Encounter: Payer: Self-pay | Admitting: Family

## 2019-10-24 ENCOUNTER — Telehealth: Payer: Self-pay | Admitting: Family

## 2019-10-24 ENCOUNTER — Inpatient Hospital Stay: Payer: Medicare Other | Attending: Hematology & Oncology

## 2019-10-24 VITALS — BP 171/53 | HR 82 | Temp 98.0°F | Resp 18 | Wt 142.2 lb

## 2019-10-24 VITALS — BP 158/60

## 2019-10-24 DIAGNOSIS — R399 Unspecified symptoms and signs involving the genitourinary system: Secondary | ICD-10-CM | POA: Diagnosis not present

## 2019-10-24 DIAGNOSIS — S2249XA Multiple fractures of ribs, unspecified side, initial encounter for closed fracture: Secondary | ICD-10-CM | POA: Diagnosis not present

## 2019-10-24 DIAGNOSIS — Z923 Personal history of irradiation: Secondary | ICD-10-CM | POA: Diagnosis not present

## 2019-10-24 DIAGNOSIS — M25552 Pain in left hip: Secondary | ICD-10-CM | POA: Insufficient documentation

## 2019-10-24 DIAGNOSIS — C7951 Secondary malignant neoplasm of bone: Secondary | ICD-10-CM | POA: Diagnosis not present

## 2019-10-24 DIAGNOSIS — D508 Other iron deficiency anemias: Secondary | ICD-10-CM

## 2019-10-24 DIAGNOSIS — E032 Hypothyroidism due to medicaments and other exogenous substances: Secondary | ICD-10-CM | POA: Diagnosis not present

## 2019-10-24 DIAGNOSIS — I890 Lymphedema, not elsewhere classified: Secondary | ICD-10-CM | POA: Insufficient documentation

## 2019-10-24 DIAGNOSIS — M5440 Lumbago with sciatica, unspecified side: Secondary | ICD-10-CM

## 2019-10-24 DIAGNOSIS — C541 Malignant neoplasm of endometrium: Secondary | ICD-10-CM | POA: Diagnosis not present

## 2019-10-24 DIAGNOSIS — R42 Dizziness and giddiness: Secondary | ICD-10-CM | POA: Diagnosis not present

## 2019-10-24 DIAGNOSIS — Z79899 Other long term (current) drug therapy: Secondary | ICD-10-CM | POA: Diagnosis not present

## 2019-10-24 DIAGNOSIS — Z5112 Encounter for antineoplastic immunotherapy: Secondary | ICD-10-CM | POA: Diagnosis not present

## 2019-10-24 DIAGNOSIS — W19XXXA Unspecified fall, initial encounter: Secondary | ICD-10-CM | POA: Diagnosis not present

## 2019-10-24 DIAGNOSIS — E039 Hypothyroidism, unspecified: Secondary | ICD-10-CM

## 2019-10-24 LAB — CMP (CANCER CENTER ONLY)
ALT: 7 U/L (ref 0–44)
AST: 13 U/L — ABNORMAL LOW (ref 15–41)
Albumin: 4.3 g/dL (ref 3.5–5.0)
Alkaline Phosphatase: 64 U/L (ref 38–126)
Anion gap: 8 (ref 5–15)
BUN: 19 mg/dL (ref 8–23)
CO2: 27 mmol/L (ref 22–32)
Calcium: 9.9 mg/dL (ref 8.9–10.3)
Chloride: 104 mmol/L (ref 98–111)
Creatinine: 0.89 mg/dL (ref 0.44–1.00)
GFR, Est AFR Am: >60 mL/min (ref >60)
GFR, Estimated: >60 mL/min (ref >60)
Glucose, Bld: 103 mg/dL — ABNORMAL HIGH (ref 70–99)
Potassium: 4.2 mmol/L (ref 3.5–5.1)
Sodium: 139 mmol/L (ref 135–145)
Total Bilirubin: 0.3 mg/dL (ref 0.3–1.2)
Total Protein: 7.3 g/dL (ref 6.5–8.1)

## 2019-10-24 LAB — CBC WITH DIFFERENTIAL (CANCER CENTER ONLY)
Abs Immature Granulocytes: 0.02 10*3/uL (ref 0.00–0.07)
Basophils Absolute: 0 10*3/uL (ref 0.0–0.1)
Basophils Relative: 1 %
Eosinophils Absolute: 0.1 10*3/uL (ref 0.0–0.5)
Eosinophils Relative: 2 %
HCT: 35.4 % — ABNORMAL LOW (ref 36.0–46.0)
Hemoglobin: 11.6 g/dL — ABNORMAL LOW (ref 12.0–15.0)
Immature Granulocytes: 0 %
Lymphocytes Relative: 13 %
Lymphs Abs: 0.6 10*3/uL — ABNORMAL LOW (ref 0.7–4.0)
MCH: 29.7 pg (ref 26.0–34.0)
MCHC: 32.8 g/dL (ref 30.0–36.0)
MCV: 90.5 fL (ref 80.0–100.0)
Monocytes Absolute: 0.3 10*3/uL (ref 0.1–1.0)
Monocytes Relative: 6 %
Neutro Abs: 3.6 10*3/uL (ref 1.7–7.7)
Neutrophils Relative %: 78 %
Platelet Count: 258 10*3/uL (ref 150–400)
RBC: 3.91 MIL/uL (ref 3.87–5.11)
RDW: 13.1 % (ref 11.5–15.5)
WBC Count: 4.6 10*3/uL (ref 4.0–10.5)
nRBC: 0 % (ref 0.0–0.2)

## 2019-10-24 LAB — URINALYSIS, COMPLETE (UACMP) WITH MICROSCOPIC
Bilirubin Urine: NEGATIVE
Glucose, UA: NEGATIVE mg/dL
Ketones, ur: NEGATIVE mg/dL
Nitrite: NEGATIVE
Protein, ur: NEGATIVE mg/dL
Specific Gravity, Urine: 1.02 (ref 1.005–1.030)
pH: 5.5 (ref 5.0–8.0)

## 2019-10-24 MED ORDER — SODIUM CHLORIDE 0.9 % IV SOLN
Freq: Once | INTRAVENOUS | Status: AC
  Filled 2019-10-24: qty 250

## 2019-10-24 MED ORDER — SODIUM CHLORIDE 0.9 % IV SOLN
400.0000 mg | Freq: Once | INTRAVENOUS | Status: AC
  Administered 2019-10-24: 400 mg via INTRAVENOUS
  Filled 2019-10-24: qty 16

## 2019-10-24 MED ORDER — ZOLEDRONIC ACID 4 MG/5ML IV CONC
3.3000 mg | Freq: Once | INTRAVENOUS | Status: AC
  Administered 2019-10-24: 3.3 mg via INTRAVENOUS
  Filled 2019-10-24: qty 4.13

## 2019-10-24 MED ORDER — SODIUM CHLORIDE 0.9% FLUSH
10.0000 mL | INTRAVENOUS | Status: DC | PRN
  Administered 2019-10-24: 10 mL
  Filled 2019-10-24: qty 10

## 2019-10-24 MED ORDER — ZOLEDRONIC ACID 4 MG/100ML IV SOLN: 4.0000 mg | Freq: Once | INTRAVENOUS | Status: DC

## 2019-10-24 MED ORDER — HEPARIN SOD (PORK) LOCK FLUSH 100 UNIT/ML IV SOLN
500.0000 [IU] | Freq: Once | INTRAVENOUS | Status: AC | PRN
  Administered 2019-10-24: 500 [IU]
  Filled 2019-10-24: qty 5

## 2019-10-24 NOTE — Patient Instructions (Signed)
Implanted Port Insertion, Care After °This sheet gives you information about how to care for yourself after your procedure. Your health care provider may also give you more specific instructions. If you have problems or questions, contact your health care provider. °What can I expect after the procedure? °After the procedure, it is common to have: °· Discomfort at the port insertion site. °· Bruising on the skin over the port. This should improve over 3-4 days. °Follow these instructions at home: °Port care °· After your port is placed, you will get a manufacturer's information card. The card has information about your port. Keep this card with you at all times. °· Take care of the port as told by your health care provider. Ask your health care provider if you or a family member can get training for taking care of the port at home. A home health care nurse may also take care of the port. °· Make sure to remember what type of port you have. °Incision care ° °  ° °· Follow instructions from your health care provider about how to take care of your port insertion site. Make sure you: °? Wash your hands with soap and water before and after you change your bandage (dressing). If soap and water are not available, use hand sanitizer. °? Change your dressing as told by your health care provider. °? Leave stitches (sutures), skin glue, or adhesive strips in place. These skin closures may need to stay in place for 2 weeks or longer. If adhesive strip edges start to loosen and curl up, you may trim the loose edges. Do not remove adhesive strips completely unless your health care provider tells you to do that. °· Check your port insertion site every day for signs of infection. Check for: °? Redness, swelling, or pain. °? Fluid or blood. °? Warmth. °? Pus or a bad smell. °Activity °· Return to your normal activities as told by your health care provider. Ask your health care provider what activities are safe for you. °· Do not  lift anything that is heavier than 10 lb (4.5 kg), or the limit that you are told, until your health care provider says that it is safe. °General instructions °· Take over-the-counter and prescription medicines only as told by your health care provider. °· Do not take baths, swim, or use a hot tub until your health care provider approves. Ask your health care provider if you may take showers. You may only be allowed to take sponge baths. °· Do not drive for 24 hours if you were given a sedative during your procedure. °· Wear a medical alert bracelet in case of an emergency. This will tell any health care providers that you have a port. °· Keep all follow-up visits as told by your health care provider. This is important. °Contact a health care provider if: °· You cannot flush your port with saline as directed, or you cannot draw blood from the port. °· You have a fever or chills. °· You have redness, swelling, or pain around your port insertion site. °· You have fluid or blood coming from your port insertion site. °· Your port insertion site feels warm to the touch. °· You have pus or a bad smell coming from the port insertion site. °Get help right away if: °· You have chest pain or shortness of breath. °· You have bleeding from your port that you cannot control. °Summary °· Take care of the port as told by your health   care provider. Keep the manufacturer's information card with you at all times. °· Change your dressing as told by your health care provider. °· Contact a health care provider if you have a fever or chills or if you have redness, swelling, or pain around your port insertion site. °· Keep all follow-up visits as told by your health care provider. °This information is not intended to replace advice given to you by your health care provider. Make sure you discuss any questions you have with your health care provider. °Document Revised: 12/13/2017 Document Reviewed: 12/13/2017 °Elsevier Patient Education ©  2020 [employer] ° °

## 2019-10-24 NOTE — Telephone Encounter (Signed)
Appointments scheduled calendar printed per 5/26 los

## 2019-10-24 NOTE — Progress Notes (Signed)
Hematology and Oncology Follow Up Visit  Shannon Scott [id number] April 17, 1941 79 y.o. 10/24/2019   Principle Diagnosis:  Locally advanced/recurrent endometrial carcinoma undifferentiated -- TMB (HIGH) / MSI HIGH  Past Therapy: Radiation therapyfor 5 -5 1/2 weeks - s/p 20 fractions Taxol/carboplatinumq 7 days -- s/p cycle 6  Current Therapy: Pembrolizumab 400 mg IV q 6 wk -- started on 01/23/2019 -- s/p cycle 16 of 200 mg q 3 week Zometa 4 mg IV q 3 months  IV iron as indicated - last received 11/10/2017   Interim History:  Shannon Scott is here today for follow-up and treatment. She is doing well but states that has had some UTI symptoms with discomfort with urination. We will get a UA and culture today.  No fever, chills, n/v, cough, SOB, chest pain, palpitations, abdominal pain or changes in bowel habits.  She has pain in the left hip and has felt a little aff center when walking. She plans to contact her orthopedist Dr. Laverta Scott for further work up and possible steroid injection.  Lymphedema in her legs, more prominent in the left is stable. She is still wrapping them with ACE wraps for added support. She gets red raised bumps on her legs that come and go. No pitting edema. Pedal pulses are 2+.  No numbness or tingling in her extremities.  She has occasional dizziness.  No falls or syncopal episodes to report.  She states that her appetite is ok and she is hydrating. Her weight is stable.   ECOG Performance Status: 1 - Symptomatic but completely ambulatory  Medications:  Allergies as of 10/24/2019      Reactions   Statins Other (See Comments)   Myalgias and memory problems   Ciprofloxacin Itching   Splotchy redness with itching during IV infusion localized to arm.      Medication List       Accurate as of Oct 24, 2019 11:35 AM. If you have any questions, ask your nurse or doctor.        amoxicillin 500 MG tablet Commonly known as: AMOXIL Take four  tablets prior to dental procedure   diclofenac sodium 1 % Gel Commonly known as: VOLTAREN Apply 2 g topically 4 (four) times daily.   diltiazem 300 MG 24 hr capsule Commonly known as: TIAZAC TAKE 1 CAPSULE(300 MG) BY MOUTH DAILY What changed: See the new instructions.   gabapentin 100 MG capsule Commonly known as: Neurontin Take 1 capsule (100 mg total) by mouth 2 (two) times daily.   ibuprofen 200 MG tablet Commonly known as: ADVIL Take 200 mg by mouth daily as needed (back pain).   lactose free nutrition Liqd Take 237 mLs by mouth 3 (three) times daily with meals.   levothyroxine 50 MCG tablet Commonly known as: SYNTHROID Take 1 tablet (50 mcg total) by mouth daily before breakfast.   lidocaine-prilocaine cream Commonly known as: EMLA Apply 1 application topically as needed.   lipase/protease/amylase 36000 UNITS Cpep capsule Commonly known as: Creon Take 2 capsules (72,000 Units total) by mouth 3 (three) times daily before meals.   nystatin cream Commonly known as: MYCOSTATIN Apply 1 application topically 2 (two) times daily.   ondansetron 8 MG tablet Commonly known as: ZOFRAN TAKE 1 TABLET(8 MG) BY MOUTH EVERY 8 HOURS AS NEEDED FOR NAUSEA OR VOMITING   pantoprazole 40 MG tablet Commonly known as: PROTONIX Take 1 tablet (40 mg total) by mouth daily. What changed:   when to take this  reasons to take this  polyethylene glycol 17 g packet Commonly known as: MIRALAX / GLYCOLAX Take 17 g by mouth daily.   prochlorperazine 10 MG tablet Commonly known as: COMPAZINE Take 1 tablet (10 mg total) by mouth every 6 (six) hours as needed for nausea or vomiting.   senna-docusate 8.6-50 MG tablet Commonly known as: Senokot-S Take 1 tablet by mouth 2 (two) times daily.   traMADol 50 MG tablet Commonly known as: ULTRAM TAKE 2 TABLETS(100 MG) BY MOUTH TWICE DAILY       Allergies:  Allergies  Allergen Reactions  . Statins Other (See Comments)    Myalgias and  memory problems  . Ciprofloxacin Itching    Splotchy redness with itching during IV infusion localized to arm.    Past Medical History, Surgical history, Social history, and Family History were reviewed and updated.  Review of Systems: All other 10 point review of systems is negative.   Physical Exam:  vitals were not taken for this visit.   Wt Readings from Last 3 Encounters:  09/17/19 145 lb (65.8 kg)  09/12/19 146 lb (66.2 kg)  09/12/19 146 lb (66.2 kg)    Ocular: Sclerae unicteric, pupils equal, round and reactive to light Ear-nose-throat: Oropharynx clear, dentition fair Lymphatic: No cervical or supraclavicular adenopathy Lungs no rales or rhonchi, good excursion bilaterally Heart regular rate and rhythm, no murmur appreciated Abd soft, nontender, positive bowel sounds, no liver or spleen tip palpated on exam, no fluid wave  MSK no focal spinal tenderness, no joint edema Neuro: non-focal, well-oriented, appropriate affect Breasts: Deferred   Lab Results  Component Value Date   WBC 5.2 09/12/2019   HGB 11.4 (L) 09/12/2019   HCT 34.6 (L) 09/12/2019   MCV 89.9 09/12/2019   PLT 260 09/12/2019   Lab Results  Component Value Date   FERRITIN 538 (H) 09/12/2019   IRON 44 09/12/2019   TIBC 188 (L) 09/12/2019   UIBC 144 09/12/2019   IRONPCTSAT 23 09/12/2019   Lab Results  Component Value Date   RETICCTPCT 0.5 (L) 10/20/2017   RBC 3.85 (L) 09/12/2019   No results found for: KPAFRELGTCHN, LAMBDASER, KAPLAMBRATIO No results found for: IGGSERUM, IGA, IGMSERUM No results found for: Odetta Pink, SPEI   Chemistry      Component Value Date/Time   NA 139 09/12/2019 1118   NA 136 (A) 09/08/2017 0000   K 4.0 09/12/2019 1118   CL 105 09/12/2019 1118   CO2 26 09/12/2019 1118   BUN 16 09/12/2019 1118   BUN 8 09/08/2017 0000   CREATININE 0.80 09/12/2019 1118   CREATININE 0.69 08/02/2016 1519   GLU 111 09/08/2017 0000       Component Value Date/Time   CALCIUM 9.5 09/12/2019 1118   ALKPHOS 63 09/12/2019 1118   AST 12 (L) 09/12/2019 1118   ALT 6 09/12/2019 1118   BILITOT 0.4 09/12/2019 1118       Impression and Plan: Shannon Scott is a very pleasant 79 yo caucasian female withrecurrent undifferentiated endometrial carcinoma. UA and culture are pending.  We discussed her recent CT scans which looked stable with no evidence of progression or new disease. She has had a nice response on Keytruda.  She will continue her same every 6 weeks treatment schedule.  We will proceed with treatment today as planned per Dr. Marin Olp and plan see her back in 6 weeks.  She is in agreement with the plan and will contact our office with any questions or concerns.  We can certainly see her sooner if needed.  Laverna Peace, NP 5/26/202111:35 AM

## 2019-10-24 NOTE — Patient Instructions (Signed)
Pembrolizumab injection What is this medicine? PEMBROLIZUMAB (pem broe liz ue mab) is a monoclonal antibody. It is used to treat certain types of cancer. This medicine may be used for other purposes; ask your health care provider or pharmacist if you have questions. COMMON BRAND NAME(S): Keytruda What should I tell my health care provider before I take this medicine? They need to know if you have any of these conditions:  diabetes  immune system problems  inflammatory bowel disease  liver disease  lung or breathing disease  lupus  received or scheduled to receive an organ transplant or a stem-cell transplant that uses donor stem cells  an unusual or allergic reaction to pembrolizumab, other medicines, foods, dyes, or preservatives  pregnant or trying to get pregnant  breast-feeding How should I use this medicine? This medicine is for infusion into a vein. It is given by a health care professional in a hospital or clinic setting. A special MedGuide will be given to you before each treatment. Be sure to read this information carefully each time. Talk to your pediatrician regarding the use of this medicine in children. While this drug may be prescribed for children as young as 6 months for selected conditions, precautions do apply. Overdosage: If you think you have taken too much of this medicine contact a poison control center or emergency room at once. NOTE: This medicine is only for you. Do not share this medicine with others. What if I miss a dose? It is important not to miss your dose. Call your doctor or health care professional if you are unable to keep an appointment. What may interact with this medicine? Interactions have not been studied. Give your health care provider a list of all the medicines, herbs, non-prescription drugs, or dietary supplements you use. Also tell them if you smoke, drink alcohol, or use illegal drugs. Some items may interact with your medicine. This  list may not describe all possible interactions. Give your health care provider a list of all the medicines, herbs, non-prescription drugs, or dietary supplements you use. Also tell them if you smoke, drink alcohol, or use illegal drugs. Some items may interact with your medicine. What should I watch for while using this medicine? Your condition will be monitored carefully while you are receiving this medicine. You may need blood work done while you are taking this medicine. Do not become pregnant while taking this medicine or for 4 months after stopping it. Women should inform their doctor if they wish to become pregnant or think they might be pregnant. There is a potential for serious side effects to an unborn child. Talk to your health care professional or pharmacist for more information. Do not breast-feed an infant while taking this medicine or for 4 months after the last dose. What side effects may I notice from receiving this medicine? Side effects that you should report to your doctor or health care professional as soon as possible:  allergic reactions like skin rash, itching or hives, swelling of the face, lips, or tongue  bloody or black, tarry  breathing problems  changes in vision  chest pain  chills  confusion  constipation  cough  diarrhea  dizziness or feeling faint or lightheaded  fast or irregular heartbeat  fever  flushing  joint pain  low blood counts - this medicine may decrease the number of white blood cells, red blood cells and platelets. You may be at increased risk for infections and bleeding.  muscle pain  muscle  weakness  pain, tingling, numbness in the hands or feet  persistent headache  redness, blistering, peeling or loosening of the skin, including inside the mouth  signs and symptoms of high blood sugar such as dizziness; dry mouth; dry skin; fruity breath; nausea; stomach pain; increased hunger or thirst; increased urination  signs  and symptoms of kidney injury like trouble passing urine or change in the amount of urine  signs and symptoms of liver injury like dark urine, light-colored stools, loss of appetite, nausea, right upper belly pain, yellowing of the eyes or skin  sweating  swollen lymph nodes  weight loss Side effects that usually do not require medical attention (report to your doctor or health care professional if they continue or are bothersome):  decreased appetite  hair loss  muscle pain  tiredness This list may not describe all possible side effects. Call your doctor for medical advice about side effects. You may report side effects to FDA at 1-800-FDA-1088. Where should I keep my medicine? This drug is given in a hospital or clinic and will not be stored at home. NOTE: This sheet is a summary. It may not cover all possible information. If you have questions about this medicine, talk to your doctor, pharmacist, or health care provider.  2020 Elsevier/Gold Standard (2019-03-23 18:07:58)

## 2019-10-25 LAB — IRON AND TIBC
Iron: 49 ug/dL (ref 41–142)
Saturation Ratios: 25 % (ref 21–57)
TIBC: 198 ug/dL — ABNORMAL LOW (ref 236–444)
UIBC: 148 ug/dL (ref 120–384)

## 2019-10-25 LAB — URINE CULTURE: Culture: 30000 — AB

## 2019-10-25 LAB — FERRITIN: Ferritin: 576 ng/mL — ABNORMAL HIGH (ref 11–307)

## 2019-10-25 LAB — TSH: TSH: 2.482 u[IU]/mL (ref 0.308–3.960)

## 2019-10-26 ENCOUNTER — Other Ambulatory Visit: Payer: Self-pay | Admitting: Hematology & Oncology

## 2019-10-26 DIAGNOSIS — G8929 Other chronic pain: Secondary | ICD-10-CM

## 2019-10-26 DIAGNOSIS — M549 Dorsalgia, unspecified: Secondary | ICD-10-CM

## 2019-10-26 DIAGNOSIS — M545 Low back pain, unspecified: Secondary | ICD-10-CM

## 2019-10-26 DIAGNOSIS — C541 Malignant neoplasm of endometrium: Secondary | ICD-10-CM

## 2019-10-26 DIAGNOSIS — M792 Neuralgia and neuritis, unspecified: Secondary | ICD-10-CM

## 2019-10-30 ENCOUNTER — Other Ambulatory Visit: Payer: Self-pay | Admitting: *Deleted

## 2019-10-30 ENCOUNTER — Telehealth: Payer: Self-pay | Admitting: *Deleted

## 2019-10-30 DIAGNOSIS — R399 Unspecified symptoms and signs involving the genitourinary system: Secondary | ICD-10-CM

## 2019-10-30 MED ORDER — AMOXICILLIN-POT CLAVULANATE 875-125 MG PO TABS: 1.0000 | ORAL_TABLET | Freq: Two times a day (BID) | ORAL | 0 refills | Status: DC

## 2019-10-30 NOTE — Telephone Encounter (Signed)
I called patient and informed her that she has a UTI and Dr. Marin Olp is prescribing antibiotics for seven days. Pharmacy verified. She verbalized understanding.

## 2019-11-27 ENCOUNTER — Other Ambulatory Visit: Payer: Self-pay | Admitting: Hematology & Oncology

## 2019-11-27 DIAGNOSIS — M545 Low back pain, unspecified: Secondary | ICD-10-CM

## 2019-11-27 DIAGNOSIS — M792 Neuralgia and neuritis, unspecified: Secondary | ICD-10-CM

## 2019-11-27 DIAGNOSIS — G8929 Other chronic pain: Secondary | ICD-10-CM

## 2019-11-27 DIAGNOSIS — C541 Malignant neoplasm of endometrium: Secondary | ICD-10-CM

## 2019-11-27 DIAGNOSIS — M549 Dorsalgia, unspecified: Secondary | ICD-10-CM

## 2019-12-04 ENCOUNTER — Inpatient Hospital Stay: Payer: Medicare Other

## 2019-12-04 ENCOUNTER — Inpatient Hospital Stay: Payer: Medicare Other | Attending: Hematology & Oncology

## 2019-12-04 ENCOUNTER — Inpatient Hospital Stay: Payer: Medicare Other | Admitting: Hematology & Oncology

## 2019-12-04 ENCOUNTER — Encounter: Payer: Self-pay | Admitting: Family

## 2019-12-04 ENCOUNTER — Other Ambulatory Visit: Payer: Self-pay

## 2019-12-04 ENCOUNTER — Inpatient Hospital Stay (HOSPITAL_BASED_OUTPATIENT_CLINIC_OR_DEPARTMENT_OTHER): Payer: Medicare Other | Admitting: Family

## 2019-12-04 VITALS — BP 143/45 | HR 67 | Resp 19 | Ht 61.0 in | Wt 145.0 lb

## 2019-12-04 DIAGNOSIS — Z5112 Encounter for antineoplastic immunotherapy: Secondary | ICD-10-CM | POA: Insufficient documentation

## 2019-12-04 DIAGNOSIS — Z79899 Other long term (current) drug therapy: Secondary | ICD-10-CM | POA: Diagnosis not present

## 2019-12-04 DIAGNOSIS — E890 Postprocedural hypothyroidism: Secondary | ICD-10-CM | POA: Insufficient documentation

## 2019-12-04 DIAGNOSIS — C541 Malignant neoplasm of endometrium: Secondary | ICD-10-CM | POA: Insufficient documentation

## 2019-12-04 DIAGNOSIS — Y842 Radiological procedure and radiotherapy as the cause of abnormal reaction of the patient, or of later complication, without mention of misadventure at the time of the procedure: Secondary | ICD-10-CM | POA: Insufficient documentation

## 2019-12-04 DIAGNOSIS — Z9221 Personal history of antineoplastic chemotherapy: Secondary | ICD-10-CM | POA: Insufficient documentation

## 2019-12-04 DIAGNOSIS — Y761 Therapeutic (nonsurgical) and rehabilitative obstetric and gynecological devices associated with adverse incidents: Secondary | ICD-10-CM | POA: Diagnosis not present

## 2019-12-04 DIAGNOSIS — B3731 Acute candidiasis of vulva and vagina: Secondary | ICD-10-CM

## 2019-12-04 DIAGNOSIS — Z87440 Personal history of urinary (tract) infections: Secondary | ICD-10-CM | POA: Diagnosis not present

## 2019-12-04 DIAGNOSIS — D508 Other iron deficiency anemias: Secondary | ICD-10-CM

## 2019-12-04 DIAGNOSIS — C799 Secondary malignant neoplasm of unspecified site: Secondary | ICD-10-CM

## 2019-12-04 DIAGNOSIS — E032 Hypothyroidism due to medicaments and other exogenous substances: Secondary | ICD-10-CM

## 2019-12-04 DIAGNOSIS — B373 Candidiasis of vulva and vagina: Secondary | ICD-10-CM | POA: Diagnosis not present

## 2019-12-04 DIAGNOSIS — Z923 Personal history of irradiation: Secondary | ICD-10-CM | POA: Insufficient documentation

## 2019-12-04 DIAGNOSIS — M7989 Other specified soft tissue disorders: Secondary | ICD-10-CM | POA: Diagnosis not present

## 2019-12-04 DIAGNOSIS — C539 Malignant neoplasm of cervix uteri, unspecified: Secondary | ICD-10-CM

## 2019-12-04 LAB — CBC WITH DIFFERENTIAL (CANCER CENTER ONLY)
Abs Immature Granulocytes: 0.02 10*3/uL (ref 0.00–0.07)
Basophils Absolute: 0 10*3/uL (ref 0.0–0.1)
Basophils Relative: 0 %
Eosinophils Absolute: 0 10*3/uL (ref 0.0–0.5)
Eosinophils Relative: 0 %
HCT: 35.5 % — ABNORMAL LOW (ref 36.0–46.0)
Hemoglobin: 11.4 g/dL — ABNORMAL LOW (ref 12.0–15.0)
Immature Granulocytes: 0 %
Lymphocytes Relative: 12 %
Lymphs Abs: 0.6 10*3/uL — ABNORMAL LOW (ref 0.7–4.0)
MCH: 29.1 pg (ref 26.0–34.0)
MCHC: 32.1 g/dL (ref 30.0–36.0)
MCV: 90.6 fL (ref 80.0–100.0)
Monocytes Absolute: 0.3 10*3/uL (ref 0.1–1.0)
Monocytes Relative: 6 %
Neutro Abs: 4.1 10*3/uL (ref 1.7–7.7)
Neutrophils Relative %: 82 %
Platelet Count: 258 10*3/uL (ref 150–400)
RBC: 3.92 MIL/uL (ref 3.87–5.11)
RDW: 13.5 % (ref 11.5–15.5)
WBC Count: 5 10*3/uL (ref 4.0–10.5)
nRBC: 0 % (ref 0.0–0.2)

## 2019-12-04 LAB — CMP (CANCER CENTER ONLY)
ALT: 7 U/L (ref 0–44)
AST: 12 U/L — ABNORMAL LOW (ref 15–41)
Albumin: 4.1 g/dL (ref 3.5–5.0)
Alkaline Phosphatase: 65 U/L (ref 38–126)
Anion gap: 7 (ref 5–15)
BUN: 19 mg/dL (ref 8–23)
CO2: 26 mmol/L (ref 22–32)
Calcium: 9.4 mg/dL (ref 8.9–10.3)
Chloride: 106 mmol/L (ref 98–111)
Creatinine: 0.8 mg/dL (ref 0.44–1.00)
GFR, Est AFR Am: >60 mL/min (ref >60)
GFR, Estimated: >60 mL/min (ref >60)
Glucose, Bld: 100 mg/dL — ABNORMAL HIGH (ref 70–99)
Potassium: 3.9 mmol/L (ref 3.5–5.1)
Sodium: 139 mmol/L (ref 135–145)
Total Bilirubin: 0.3 mg/dL (ref 0.3–1.2)
Total Protein: 7 g/dL (ref 6.5–8.1)

## 2019-12-04 MED ORDER — SODIUM CHLORIDE 0.9 % IV SOLN
400.0000 mg | Freq: Once | INTRAVENOUS | Status: AC
  Administered 2019-12-04: 400 mg via INTRAVENOUS
  Filled 2019-12-04: qty 16

## 2019-12-04 MED ORDER — SODIUM CHLORIDE 0.9 % IV SOLN
Freq: Once | INTRAVENOUS | Status: AC
  Filled 2019-12-04: qty 250

## 2019-12-04 MED ORDER — SODIUM CHLORIDE 0.9% FLUSH
10.0000 mL | INTRAVENOUS | Status: DC | PRN
  Administered 2019-12-04: 10 mL
  Filled 2019-12-04: qty 10

## 2019-12-04 MED ORDER — HEPARIN SOD (PORK) LOCK FLUSH 100 UNIT/ML IV SOLN
500.0000 [IU] | Freq: Once | INTRAVENOUS | Status: AC | PRN
  Administered 2019-12-04: 500 [IU]
  Filled 2019-12-04: qty 5

## 2019-12-04 MED ORDER — FLUCONAZOLE 150 MG PO TABS: 150.0000 mg | ORAL_TABLET | Freq: Once | ORAL | 0 refills | Status: AC

## 2019-12-04 NOTE — Patient Instructions (Signed)

## 2019-12-04 NOTE — Progress Notes (Signed)
Hematology and Oncology Follow Up Visit  Shannon Scott [id number] 08/11/1940 79 y.o. 12/04/2019   Principle Diagnosis:  Locally advanced/recurrent endometrial carcinoma undifferentiated -- TMB (HIGH) / MSI HIGH  Past Therapy: Radiation therapyfor 5 -5 1/2 weeks - s/p 20 fractions Taxol/carboplatinumq 7 days -- s/p cycle 6  Current Therapy: Pembrolizumab 400 mg IV q 6 wk -- startedon 01/23/2019 -- s/p cycle 16 of 200 mg q 3 week Zometa 4 mg IV q 3 months  IV iron as indicated - last received 11/10/2017   Interim History:  [name] is here today for follow-up and treatment. She is doing well but feels she may a bit of a vaginal yeast infection after recent antibiotic treatment for UTI.  No fever, chills, n/v, cough, rash, dizziness, SOB, chest pain, palpitations, abdominal pain or changes in bowel or bladder habits.  The swelling in her lower extremities, more prominent in the left leg, is stable/unchanged. Pedal pulses are 2+ and no pitting edema or redness was noted.  She has some red dry patches on her legs and back. She states that these come and go. All appear clean, dry and intact at this time. No lesions or vesicles noted.  No tenderness, numbness or tingling in her extremities.  No episodes of bleeding. No abnormal bruising, no petechiae.  She has maintained a good appetite and is staying well hydrated. Her weight is stable.   ECOG Performance Status: 1 - Symptomatic but completely ambulatory  Medications:  Allergies as of 12/04/2019      Reactions   Statins Other (See Comments)   Myalgias and memory problems   Ciprofloxacin Itching   Splotchy redness with itching during IV infusion localized to arm.      Medication List       Accurate as of December 04, 2019  1:25 PM. If you have any questions, ask your nurse or doctor.        amoxicillin 500 MG tablet Commonly known as: AMOXIL Take four tablets prior to dental procedure     amoxicillin-clavulanate 875-125 MG tablet Commonly known as: Augmentin Take 1 tablet by mouth 2 (two) times daily.   diclofenac sodium 1 % Gel Commonly known as: VOLTAREN Apply 2 g topically 4 (four) times daily.   diltiazem 300 MG 24 hr capsule Commonly known as: TIAZAC TAKE 1 CAPSULE(300 MG) BY MOUTH DAILY What changed: See the new instructions.   gabapentin 100 MG capsule Commonly known as: Neurontin Take 1 capsule (100 mg total) by mouth 2 (two) times daily.   ibuprofen 200 MG tablet Commonly known as: ADVIL Take 200 mg by mouth daily as needed (back pain).   lactose free nutrition Liqd Take 237 mLs by mouth 3 (three) times daily with meals.   levothyroxine 50 MCG tablet Commonly known as: SYNTHROID Take 1 tablet (50 mcg total) by mouth daily before breakfast.   lidocaine-prilocaine cream Commonly known as: EMLA Apply 1 application topically as needed.   lipase/protease/amylase 36000 UNITS Cpep capsule Commonly known as: Creon Take 2 capsules (72,000 Units total) by mouth 3 (three) times daily before meals.   nystatin cream Commonly known as: MYCOSTATIN Apply 1 application topically 2 (two) times daily.   ondansetron 8 MG tablet Commonly known as: ZOFRAN TAKE 1 TABLET(8 MG) BY MOUTH EVERY 8 HOURS AS NEEDED FOR NAUSEA OR VOMITING   pantoprazole 40 MG tablet Commonly known as: PROTONIX Take 1 tablet (40 mg total) by mouth daily. What changed:   when to take this    reasons to take this   polyethylene glycol 17 g packet Commonly known as: MIRALAX / GLYCOLAX Take 17 g by mouth daily.   prochlorperazine 10 MG tablet Commonly known as: COMPAZINE Take 1 tablet (10 mg total) by mouth every 6 (six) hours as needed for nausea or vomiting.   senna-docusate 8.6-50 MG tablet Commonly known as: Senokot-S Take 1 tablet by mouth 2 (two) times daily.   traMADol 50 MG tablet Commonly known as: ULTRAM Take 2 tablets (100 mg total) by mouth 2 (two) times daily.        Allergies:  Allergies  Allergen Reactions  . Statins Other (See Comments)    Myalgias and memory problems  . Ciprofloxacin Itching    Splotchy redness with itching during IV infusion localized to arm.    Past Medical History, Surgical history, Social history, and Family History were reviewed and updated.  Review of Systems: All other 10 point review of systems is negative.   Physical Exam:  height is 5' 1" (1.549 m) and weight is 145 lb (65.8 kg). Her blood pressure is 143/45 (abnormal) and her pulse is 67. Her respiration is 19 and oxygen saturation is 98%.   Wt Readings from Last 3 Encounters:  12/04/19 145 lb (65.8 kg)  10/24/19 142 lb 4 oz (64.5 kg)  09/17/19 145 lb (65.8 kg)    Ocular: Sclerae unicteric, pupils equal, round and reactive to light Ear-nose-throat: Oropharynx clear, dentition fair Lymphatic: No cervical or supraclavicular adenopathy Lungs no rales or rhonchi, good excursion bilaterally Heart regular rate and rhythm, no murmur appreciated Abd soft, nontender, positive bowel sounds, no liver or spleen tip palpated on exam, no fluid wave  MSK no focal spinal tenderness, no joint edema Neuro: non-focal, well-oriented, appropriate affect Breasts: Deferred   Lab Results  Component Value Date   WBC 5.0 12/04/2019   HGB 11.4 (L) 12/04/2019   HCT 35.5 (L) 12/04/2019   MCV 90.6 12/04/2019   PLT 258 12/04/2019   Lab Results  Component Value Date   FERRITIN 576 (H) 10/24/2019   IRON 49 10/24/2019   TIBC 198 (L) 10/24/2019   UIBC 148 10/24/2019   IRONPCTSAT 25 10/24/2019   Lab Results  Component Value Date   RETICCTPCT 0.5 (L) 10/20/2017   RBC 3.92 12/04/2019   No results found for: KPAFRELGTCHN, LAMBDASER, KAPLAMBRATIO No results found for: IGGSERUM, IGA, IGMSERUM No results found for: Odetta Pink, SPEI   Chemistry      Component Value Date/Time   NA 139 12/04/2019 1203   NA 136 (A)  09/08/2017 0000   K 3.9 12/04/2019 1203   CL 106 12/04/2019 1203   CO2 26 12/04/2019 1203   BUN 19 12/04/2019 1203   BUN 8 09/08/2017 0000   CREATININE 0.80 12/04/2019 1203   CREATININE 0.69 08/02/2016 1519   GLU 111 09/08/2017 0000      Component Value Date/Time   CALCIUM 9.4 12/04/2019 1203   ALKPHOS 65 12/04/2019 1203   AST 12 (L) 12/04/2019 1203   ALT 7 12/04/2019 1203   BILITOT 0.3 12/04/2019 1203       Impression and Plan: Ms. Hitz is a very pleasant 79 yo caucasian female withrecurrent undifferentiated endometrial carcinoma. We will proceed with treatment today as planned.  I sent a prescription in for Diflucan for her yeast infection, 150 mg PO once.  She did request her treatments be moved to every 8 weeks as this is easier for her. That  will be fine per Dr. Marin Olp.  She can contact our office with any questions or concerns. We can certainly see her sooner if needed.  Laverna Peace, NP 7/6/20211:25 PM

## 2019-12-04 NOTE — Patient Instructions (Signed)
Pembrolizumab injection What is this medicine? PEMBROLIZUMAB (pem broe liz ue mab) is a monoclonal antibody. It is used to treat certain types of cancer. This medicine may be used for other purposes; ask your health care provider or pharmacist if you have questions. COMMON BRAND NAME(S): Keytruda What should I tell my health care provider before I take this medicine? They need to know if you have any of these conditions:  diabetes  immune system problems  inflammatory bowel disease  liver disease  lung or breathing disease  lupus  received or scheduled to receive an organ transplant or a stem-cell transplant that uses donor stem cells  an unusual or allergic reaction to pembrolizumab, other medicines, foods, dyes, or preservatives  pregnant or trying to get pregnant  breast-feeding How should I use this medicine? This medicine is for infusion into a vein. It is given by a health care professional in a hospital or clinic setting. A special MedGuide will be given to you before each treatment. Be sure to read this information carefully each time. Talk to your pediatrician regarding the use of this medicine in children. While this drug may be prescribed for children as young as 6 months for selected conditions, precautions do apply. Overdosage: If you think you have taken too much of this medicine contact a poison control center or emergency room at once. NOTE: This medicine is only for you. Do not share this medicine with others. What if I miss a dose? It is important not to miss your dose. Call your doctor or health care professional if you are unable to keep an appointment. What may interact with this medicine? Interactions have not been studied. Give your health care provider a list of all the medicines, herbs, non-prescription drugs, or dietary supplements you use. Also tell them if you smoke, drink alcohol, or use illegal drugs. Some items may interact with your medicine. This  list may not describe all possible interactions. Give your health care provider a list of all the medicines, herbs, non-prescription drugs, or dietary supplements you use. Also tell them if you smoke, drink alcohol, or use illegal drugs. Some items may interact with your medicine. What should I watch for while using this medicine? Your condition will be monitored carefully while you are receiving this medicine. You may need blood work done while you are taking this medicine. Do not become pregnant while taking this medicine or for 4 months after stopping it. Women should inform their doctor if they wish to become pregnant or think they might be pregnant. There is a potential for serious side effects to an unborn child. Talk to your health care professional or pharmacist for more information. Do not breast-feed an infant while taking this medicine or for 4 months after the last dose. What side effects may I notice from receiving this medicine? Side effects that you should report to your doctor or health care professional as soon as possible:  allergic reactions like skin rash, itching or hives, swelling of the face, lips, or tongue  bloody or black, tarry  breathing problems  changes in vision  chest pain  chills  confusion  constipation  cough  diarrhea  dizziness or feeling faint or lightheaded  fast or irregular heartbeat  fever  flushing  joint pain  low blood counts - this medicine may decrease the number of white blood cells, red blood cells and platelets. You may be at increased risk for infections and bleeding.  muscle pain  muscle  weakness  pain, tingling, numbness in the hands or feet  persistent headache  redness, blistering, peeling or loosening of the skin, including inside the mouth  signs and symptoms of high blood sugar such as dizziness; dry mouth; dry skin; fruity breath; nausea; stomach pain; increased hunger or thirst; increased urination  signs  and symptoms of kidney injury like trouble passing urine or change in the amount of urine  signs and symptoms of liver injury like dark urine, light-colored stools, loss of appetite, nausea, right upper belly pain, yellowing of the eyes or skin  sweating  swollen lymph nodes  weight loss Side effects that usually do not require medical attention (report to your doctor or health care professional if they continue or are bothersome):  decreased appetite  hair loss  muscle pain  tiredness This list may not describe all possible side effects. Call your doctor for medical advice about side effects. You may report side effects to FDA at 1-800-FDA-1088. Where should I keep my medicine? This drug is given in a hospital or clinic and will not be stored at home. NOTE: This sheet is a summary. It may not cover all possible information. If you have questions about this medicine, talk to your doctor, pharmacist, or health care provider.  2020 Elsevier/Gold Standard (2019-03-23 18:07:58)

## 2019-12-05 LAB — IRON AND TIBC
Iron: 49 ug/dL (ref 41–142)
Saturation Ratios: 26 % (ref 21–57)
TIBC: 186 ug/dL — ABNORMAL LOW (ref 236–444)
UIBC: 137 ug/dL (ref 120–384)

## 2019-12-05 LAB — TSH: TSH: 2.054 u[IU]/mL (ref 0.308–3.960)

## 2019-12-05 LAB — FERRITIN: Ferritin: 448 ng/mL — ABNORMAL HIGH (ref 11–307)

## 2019-12-28 ENCOUNTER — Telehealth: Payer: Self-pay | Admitting: *Deleted

## 2019-12-28 NOTE — Telephone Encounter (Signed)
Called and scheduled the patient for a follow up appt on 8/5

## 2020-01-03 ENCOUNTER — Other Ambulatory Visit: Payer: Self-pay

## 2020-01-03 ENCOUNTER — Encounter: Payer: Self-pay | Admitting: Gynecologic Oncology

## 2020-01-03 ENCOUNTER — Inpatient Hospital Stay: Payer: Medicare Other | Attending: Hematology & Oncology | Admitting: Gynecologic Oncology

## 2020-01-03 VITALS — BP 174/68 | HR 83 | Temp 98.5°F | Resp 18 | Ht 61.0 in | Wt 147.0 lb

## 2020-01-03 DIAGNOSIS — Z923 Personal history of irradiation: Secondary | ICD-10-CM | POA: Insufficient documentation

## 2020-01-03 DIAGNOSIS — Z90710 Acquired absence of both cervix and uterus: Secondary | ICD-10-CM

## 2020-01-03 DIAGNOSIS — Z79899 Other long term (current) drug therapy: Secondary | ICD-10-CM | POA: Diagnosis not present

## 2020-01-03 DIAGNOSIS — I700 Atherosclerosis of aorta: Secondary | ICD-10-CM | POA: Insufficient documentation

## 2020-01-03 DIAGNOSIS — N762 Acute vulvitis: Secondary | ICD-10-CM | POA: Diagnosis not present

## 2020-01-03 DIAGNOSIS — Z9221 Personal history of antineoplastic chemotherapy: Secondary | ICD-10-CM | POA: Diagnosis not present

## 2020-01-03 DIAGNOSIS — M1990 Unspecified osteoarthritis, unspecified site: Secondary | ICD-10-CM | POA: Diagnosis not present

## 2020-01-03 DIAGNOSIS — I272 Pulmonary hypertension, unspecified: Secondary | ICD-10-CM | POA: Insufficient documentation

## 2020-01-03 DIAGNOSIS — Z791 Long term (current) use of non-steroidal anti-inflammatories (NSAID): Secondary | ICD-10-CM | POA: Insufficient documentation

## 2020-01-03 DIAGNOSIS — J45909 Unspecified asthma, uncomplicated: Secondary | ICD-10-CM | POA: Diagnosis not present

## 2020-01-03 DIAGNOSIS — I10 Essential (primary) hypertension: Secondary | ICD-10-CM | POA: Insufficient documentation

## 2020-01-03 DIAGNOSIS — E039 Hypothyroidism, unspecified: Secondary | ICD-10-CM | POA: Insufficient documentation

## 2020-01-03 DIAGNOSIS — N760 Acute vaginitis: Secondary | ICD-10-CM | POA: Insufficient documentation

## 2020-01-03 DIAGNOSIS — Z951 Presence of aortocoronary bypass graft: Secondary | ICD-10-CM | POA: Insufficient documentation

## 2020-01-03 DIAGNOSIS — K449 Diaphragmatic hernia without obstruction or gangrene: Secondary | ICD-10-CM | POA: Diagnosis not present

## 2020-01-03 DIAGNOSIS — E785 Hyperlipidemia, unspecified: Secondary | ICD-10-CM | POA: Diagnosis not present

## 2020-01-03 DIAGNOSIS — C541 Malignant neoplasm of endometrium: Secondary | ICD-10-CM | POA: Diagnosis present

## 2020-01-03 DIAGNOSIS — I2510 Atherosclerotic heart disease of native coronary artery without angina pectoris: Secondary | ICD-10-CM | POA: Diagnosis not present

## 2020-01-03 MED ORDER — TRIAMCINOLONE ACETONIDE 0.5 % EX OINT: TOPICAL_OINTMENT | Freq: Every day | CUTANEOUS | 0 refills | Status: DC

## 2020-01-03 NOTE — Progress Notes (Signed)
Gynecologic Oncology Return Clinic Visit  01/03/20  Reason for Visit: surveillance visit for endometrial cancer  Treatment History: Oncology History Overview Note  The patient began noticing postmenopausal bleeding in June, 2017. She saw her primary care physician who determined there was no blood in the urine, and she was then referred to Dr Benjie Karvonen who, on 11/21/15 performed a TVUS which showed a uterus measuring 6x2.7x3.3cm with normal ovaries. There was a thickened endometrium of 6.52m.    [place]ometrial [place] ([place]  11/21/2015 Initial Diagnosis   [place]rial [place] A pipelle endometrial biopsy was performed on 11/21/15 which showed FIGO grade 1-2 endometrial cancer.  The pap from the same date was normal.   12/30/2015 Surgery   Robotic assisted total hysterectomy, BSO, pelvic and PA SLN biopsy. 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  Postoperative Diagnosis: same.  Surgery: Total robotic hysterectomy bilateral salpingo-oophorectomy, left pelvic, right para-aortic and bifurcation SLN removal  Operative findings:  1) Colon adherent to posterior uterus, left sidewall and appendix 2) 2 right para-aortic nodes for SLN (high and PA node) 3) Aortic bifurcation nodes 4) Left obturator node  Diagnosis 1. Lymph node, sentinel, biopsy, right obturator - ONE BENIGN LYMPH NODE (0/1). 2. Lymph node, sentinel, biopsy, right peri-aortic - ONE BENIGN LYMPH NODE (0/1). 3. Lymph node, sentinel, biopsy, right lower peri-aortic - ONE BENIGN LYMPH NODE (0/1). 4. Lymph node, sentinel, biopsy, aortic bifurcation - ONE BENIGN LYMPH NODE (0/1). 5. Lymph node, sentinel, biopsy, left obturator - ONE BENIGN LYMPH NODE (0/1). 6. Uterus +/- tubes/ovaries, neoplastic - ENDOMETRIAL ADENOCARCINOMA, 1.7 CM WITH SUPERFICIAL MYOMETRIAL INVASION. - MARGINS NOT INVOLVED. - CERVIX, BILATERAL OVARIES AND BILATERAL FALLOPIAN TUBES FREE OF TUMOR. Microscopic Comment 6. ONCOLOGY  TABLE-UTERUS, CARCINOMA OR CARCINOSARCOMA Specimen: Uterus with bilateral fallopian tubes and ovaries and sentinel lymph node biopsies. Procedure: Hysterectomy with sentinel lymph nodes. Lymph node sampling performed: Yes. Specimen integrity: Intact. Maximum tumor size: 1.7 cm Histologic type: Endometrioid with squamous differentiation. Grade: 2 Myometrial invasion: 0.3 cm where myometrium is 1 cm in thickness Cervical stromal involvement: No. Extent of involvement of other organs: None, identified. Lymph - vascular invasion: Not identified.    05/13/2016 Imaging   05/03/16 CT guided drainage: IMPRESSION: Successful CT guided aspiration of approximately 15 cc of serous, slightly cloudy / milky fluid from the residual collection within the left hemipelvis. All aspirated samples were sent to the laboratory for cytologic and gram stain analysis.  Cytology was negative   07/2017 Progression   EZOELLE MARKUSin February 2019 presented to her primary physician with complaints of rectal bleeding.  She was scheduled for a colonoscopy and had taken the bowel prep when she presented with obstruction of her distal sigmoid colon.   08/18/2017 Imaging   maging 08/18/2017 The left ureter is dilated to the left hemipelvis. It becomes narrowed in the inflammatory pelvic mass. This is described below. Bladder is within normal limits.   Stomach/Bowel: Large hiatal hernia is unchanged. Stable prominent gastric folds within the hiatal hernia.   The colon is diffusely distended. Distal small bowel is distended. A transition point between dilated large bowel and decompressed large bowel occurs in the sigmoid colon. There is a heterogeneous ill-defined mass involving the sigmoid colon and left side of the pelvis which includes the colon wall, adjacent fat, and both fluid and gas elements. Findings are suspicious for a focal perforation with abscess formation. Underlying malignancy is a strong consideration  given the appearance and colon obstruction. The mass also  involves the left ureter causing an element of left ureteral dilatation worrisome for an element of left ureteral obstruction. The gas and fluid collection is very small measuring 1.8 x 1.2 cm on  image 109 of series 4. Overall mass size including the involved colon is 5.7 x 5.3 cm.    08/21/2017 Surgery   [name] underwent a diverting laparoscopic transverse loop colostomy and subsequent placement of a left ureteral stent.   08/2017 -  Radiation Therapy   She then received pelvic external beam radiation therapy    10/12/2017 - 11/23/2017 Chemotherapy   The patient had palonosetron (ALOXI) injection 0.25 mg, 0.25 mg, Intravenous,  Once, 6 of 6 cycles Administration: 0.25 mg (10/13/2017), 0.25 mg (10/20/2017), 0.25 mg (11/03/2017), 0.25 mg (11/10/2017), 0.25 mg (11/17/2017) CARBOplatin (PARAPLATIN) 150 mg in sodium chloride 0.9 % 100 mL chemo infusion, 150 mg (100 % of original dose 148 mg), Intravenous,  Once, 6 of 6 cycles Dose modification:   (original dose 148 mg, Cycle 1) Administration: 150 mg (10/13/2017), 150 mg (10/20/2017), 150 mg (11/03/2017), 150 mg (11/10/2017), 150 mg (11/17/2017) PACLitaxel (TAXOL) 132 mg in sodium chloride 0.9 % 250 mL chemo infusion (</= 64m/m2), 80 mg/m2 = 132 mg, Intravenous,  Once, 6 of 6 cycles Dose modification: 45 mg/m2 (original dose 80 mg/m2, Cycle 2, Reason: Other (see comments), Comment: Decreasing dose to 45 mg/m2 while undergoing XRT) Administration: 132 mg (10/13/2017), 72 mg (10/20/2017), 72 mg (11/03/2017), 72 mg (11/10/2017), 72 mg (11/17/2017)  for chemotherapy treatment.    01/18/2018 Imaging   notable for the presence of an IVC filter and 11 mm left internal iliac node that was previously 1.6 cm and a soft tissue mass in the left pelvic sidewall and now measuring 3.9 x 2.5 cm.   03/03/2018 Imaging   PET  demonstrates a hypermetabolic left pelvic sidewall mass, greater than 3 weeks after  completion of radiotherapy.   03/16/2018 -  Chemotherapy   The patient had pembrolizumab (KEYTRUDA) 200 mg in sodium chloride 0.9 % 50 mL chemo infusion, 200 mg, Intravenous, Once, 18 of 19 cycles Dose modification: 400 mg (original dose 200 mg, Cycle 11, Reason: Other (see comments), Comment: change to q 6 week) Administration: 200 mg (03/16/2018), 200 mg (04/06/2018), 200 mg (05/04/2018), 200 mg (05/25/2018), 200 mg (10/10/2018), 200 mg (06/15/2018), 200 mg (07/27/2018), 200 mg (10/31/2018), 200 mg (11/21/2018), 200 mg (01/03/2019), 400 mg (01/23/2019), 400 mg (03/07/2019), 400 mg (04/17/2019), 400 mg (06/08/2019), 400 mg (08/02/2019), 400 mg (09/12/2019), 400 mg (10/24/2019), 400 mg (12/04/2019)  for chemotherapy treatment.    04/2018 Miscellaneous    CA 125  Ref. Range 05/04/2018 12:08 05/25/2018 11:21 06/15/2018 13:30 10/10/2018 12:20 01/23/2019 11:35  Cancer Antigen (CA) 125 Latest Ref Range: 0.0 - 38.1 U/mL 11.2 10.9 10.2 10.9 9.2     10/05/2018 Imaging   PET  3. Continued positive response to therapy within the left pelvic sidewall mass, which demonstrates residual low level hypermetabolism that has mildly decreased. 4. Resolved mediastinal nodal hypermetabolism. Nearly resolved bilateral hilar nodal hypermetabolism. These findings could represent resolving reactive nodes versus response to therapy within metastatic nodes. 5. No metabolic evidence of new sites of metastatic disease.   09/27/2019 Imaging   CT C/A/P: IMPRESSION: 1. Since the PET of 10/05/2018, similar to slight decrease in left pelvic sidewall soft tissue fullness, without well-defined mass. 2. No findings of  new or progressive disease. 3. Chronic or recurrent left renal pelvic and proximal ureteric wall thickening/mucosal hyperenhancement. Nonspecific, especially in  the setting of a prior stent. Cannot exclude ascending infection. Similarly, improved bladder wall and surrounding edema which could simply be treatment related.  Correlate with urinalysis to exclude cystitis. 4. Small hiatal hernia. 5. Transverse colostomy with similar small bowel containing parastomal hernia. 6.  Aortic Atherosclerosis (ICD10-I70.0).     Interval History: Ms. Fukuda presents today for vulvar symptoms that she's had a least for a couple of weeks. She has some difficulty with temporal recounting of events.  She endorses  at least several weeks of vulvar erythema, irritation, mild pruritus, and burning upon urination.  She continues to take MiraLAX at night to help keep her bowel function regular.  She otherwise denies urinary symptoms with the exception of feeling as though she does not empty completely.  Previously took Diflucan for her vulvar symptoms with only mild relief.  She reports her appetite has improved, denies any nausea, emesis, or abdominal pain.    Follow with Dr. Marin Olp; last saw Laverna Peace NP on 7/6. Continues on Pembro in the setting of her MSI-H recurrent endometrial cancer with left pelvic sidewall mass.   She continues to note left leg edema which is unchanged for months. Previously underwent DVT work-up which was negative.   Past Medical/Surgical History: Past Medical History:  Diagnosis Date  . Arthritis   . Asthma    allergy induced  . Bilateral carotid artery disease (HCC)    L carotid bruit  . Bursitis    left hip  . CAD (coronary artery disease)   . Cancer ([place])   . Dyslipidemia    intolerant to statins, welchol, niacin, zetia  . Goals of care, counseling/discussion 10/11/2017  . History of blood transfusion   . History of nuclear stress test 04/24/2012   lexiscan; normal study  . Hypertension   . Hypothyroidism   . 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 ([place]) 10/11/2017  . Postoperative nausea and vomiting 01/02/2016    Past Surgical History:  Procedure Laterality Date  . ABDOMINAL HYSTERECTOMY    . Carotid Doppler  02/2012   40-59%  right int carotid artery stenosis; 60-79% L int carotid stenosis; L carotid bruit  . CORONARY ARTERY BYPASS GRAFT  03/12/2004   LIMA to LAD, SVG to circumflex, SVG to PDA  . CYSTOSCOPY W/ URETERAL STENT PLACEMENT Left 12/06/2017   Procedure: CYSTOSCOPY WITH LEFT URETERAL STENT EXCHANGE;  Surgeon: Irine Seal, MD;  Location: [facility];  Service: Urology;  Laterality: Left;  . CYSTOSCOPY WITH STENT PLACEMENT Bilateral 08/21/2017   Procedure: CYSTOSCOPY WITH STENT PLACEMENT;  Surgeon: Ceasar Mons, MD;  Location: [facility];  Service: Urology;  Laterality: Bilateral;  . IR FLUORO GUIDE PORT INSERTION RIGHT  10/11/2017  . IR GENERIC HISTORICAL  04/29/2016   IR RADIOLOGIST EVAL & MGMT 04/29/2016 Sandi Mariscal, MD [facility]  . IR GENERIC HISTORICAL  05/12/2016   IR RADIOLOGIST EVAL & MGMT 05/12/2016 Sandi Mariscal, MD [facility] INTER[facility] RAD  . IR IVC FILTER PLMT / S&I /IMG GUID/MOD SED  10/11/2017  . IR US GUIDE VASC ACCESS RIGHT  10/11/2017  . ROBOTIC ASSISTED TOTAL HYSTERECTOMY WITH BILATERAL SALPINGO OOPHERECTOMY Bilateral 12/30/2015   Procedure: XI ROBOTIC ASSISTED TOTAL HYSTERECTOMY WITH BILATERAL SALPINGO OOPHORECTOMY WITH SENTAL LYMPH NODE BIOPSY;  Surgeon: Nancy Marus, MD;  Location: [facility];  Service: Gynecology;  Laterality: Bilateral;  . TONSILLECTOMY    . TRANSTHORACIC ECHOCARDIOGRAM  04/2007   EF>55%; mild MR; mild-mod TR; mild pulm HTN; mild calcification of aortiv  valve leaflets with mild valvular aortic stenosis  . TUBAL LIGATION      Family History  Problem Relation Age of Onset  . Hypertension Mother   . Heart disease Mother        Died in her 86s  . Stroke Father   . Kidney disease Brother   . Heart disease Brother        also HTN, hyperlipidemia  . Heart attack Brother   . Stroke Sister        x2  . Hypertension Sister     Social History   Socioeconomic History  . Marital status: Widowed    Spouse name: Not on file  . Number of children: 2  . Years of education: Not  on file  . Highest education level: Not on file  Occupational History    Employer: RETIRED  Tobacco Use  . Smoking status: Never Smoker  . Smokeless tobacco: Never Used  Vaping Use  . Vaping Use: Never used  Substance and Sexual Activity  . Alcohol use: No  . Drug use: No  . Sexual activity: Not on file  Other Topics Concern  . Not on file  Social History Narrative  . Not on file   Social Determinants of Health   Financial Resource Strain:   . Difficulty of Paying Living Expenses:   Food Insecurity:   . Worried About Charity fundraiser in the Last Year:   . Arboriculturist in the Last Year:   Transportation Needs:   . Film/video editor (Medical):   Marland Kitchen Lack of Transportation (Non-Medical):   Physical Activity:   . Days of Exercise per Week:   . Minutes of Exercise per Session:   Stress:   . Feeling of Stress :   Social Connections:   . Frequency of Communication with Friends and Family:   . Frequency of Social Gatherings with Friends and Family:   . Attends Religious Services:   . Active Member of Clubs or Organizations:   . Attends Archivist Meetings:   Marland Kitchen Marital Status:     Current Medications:  Current Outpatient Medications:  .  amoxicillin (AMOXIL) 500 MG tablet, Take four tablets prior to dental procedure, Disp: 4 tablet, Rfl: 0 .  amoxicillin-clavulanate (AUGMENTIN) 875-125 MG tablet, Take 1 tablet by mouth 2 (two) times daily., Disp: 14 tablet, Rfl: 0 .  diclofenac sodium (VOLTAREN) 1 % GEL, Apply 2 g topically 4 (four) times daily., Disp: , Rfl:  .  diltiazem (TIAZAC) 300 MG 24 hr capsule, TAKE 1 CAPSULE(300 MG) BY MOUTH DAILY (Patient taking differently: Take 300 mg by mouth daily. ), Disp: 30 capsule, Rfl: 1 .  gabapentin (NEURONTIN) 100 MG capsule, Take 1 capsule (100 mg total) by mouth 2 (two) times daily., Disp: 60 capsule, Rfl: 3 .  ibuprofen (ADVIL,MOTRIN) 200 MG tablet, Take 200 mg by mouth daily as needed (back pain)., Disp: , Rfl:   .  lactose free nutrition (BOOST PLUS) LIQD, Take 237 mLs by mouth 3 (three) times daily with meals., Disp: , Rfl: 0 .  levothyroxine (SYNTHROID, LEVOTHROID) 50 MCG tablet, Take 1 tablet (50 mcg total) by mouth daily before breakfast., Disp: 30 tablet, Rfl: 1 .  lidocaine-prilocaine (EMLA) cream, Apply 1 application topically as needed., Disp: 30 g, Rfl: 6 .  lipase/protease/amylase (CREON) 36000 UNITS CPEP capsule, Take 2 capsules (72,000 Units total) by mouth 3 (three) times daily before meals., Disp: 180 capsule, Rfl: 6 .  nystatin  cream (MYCOSTATIN), Apply 1 application topically 2 (two) times daily., Disp: 30 g, Rfl: 1 .  ondansetron (ZOFRAN) 8 MG tablet, TAKE 1 TABLET(8 MG) BY MOUTH EVERY 8 HOURS AS NEEDED FOR NAUSEA OR VOMITING, Disp: 30 tablet, Rfl: 0 .  pantoprazole (PROTONIX) 40 MG tablet, Take 1 tablet (40 mg total) by mouth daily. (Patient taking differently: Take 40 mg by mouth daily as needed (acid reflux). ), Disp: 30 tablet, Rfl: 0 .  polyethylene glycol (MIRALAX / GLYCOLAX) packet, Take 17 g by mouth daily., Disp: 30 each, Rfl: 0 .  prochlorperazine (COMPAZINE) 10 MG tablet, Take 1 tablet (10 mg total) by mouth every 6 (six) hours as needed for nausea or vomiting., Disp: 30 tablet, Rfl: 3 .  senna-docusate (SENOKOT-S) 8.6-50 MG tablet, Take 1 tablet by mouth 2 (two) times daily., Disp: , Rfl:  .  traMADol (ULTRAM) 50 MG tablet, Take 2 tablets (100 mg total) by mouth 2 (two) times daily., Disp: 120 tablet, Rfl: 0 .  triamcinolone ointment (KENALOG) 0.5 %, Apply topically daily. Use for 2 weeks, Disp: 30 g, Rfl: 0  Review of Systems: Pertinent positives as per HPI Denies fevers, chills, fatigue, unexplained weight changes. Denies hearing loss, neck lumps or masses, mouth sores, ringing in ears or voice changes. Denies cough or wheezing.  Denies shortness of breath. Denies chest pain or palpitations. Denies leg swelling. Denies abdominal distention, pain, blood in stools,  constipation, diarrhea, nausea, vomiting, or early satiety. Denies pain with intercourse, frequency, hematuria or incontinence. Denies hot flashes, pelvic pain, vaginal bleeding or vaginal discharge.   Denies joint pain, back pain or muscle pain/cramps. Denies dizziness, headaches, numbness or seizures. Denies swollen lymph nodes or glands, denies easy bruising or bleeding. Denies anxiety, depression, confusion, or decreased concentration.  Physical Exam: BP (!) 174/68 (BP Location: Left Arm, Patient Position: Sitting)   Pulse 83   Temp 98.5 F (36.9 C) (Oral)   Resp 18   Ht _0  (1.549 m)   Wt 147 lb (66.7 kg)   SpO2 98%   BMI 27.78 kg/m  General: Alert, oriented, no acute distress. HEENT: Normocephalic, atraumatic, sclera anicteric. Chest: Unlabored breathing on room air. Abdomen: soft, nontender.  Normoactive bowel sounds.  No masses or hepatosplenomegaly appreciated.  Well-healed scar. Extremities: Grossly normal range of motion.  Warm, well perfused.  1-2+ edema of the left lower extremity. Lymphatics: No cervical, supraclavicular, or inguinal adenopathy. GU: Labia minora and much of the labia majora are quite erythematous and tender to the touch.  No lesions or atypical vasculature noted.  No loss of architecture.  Speculum exam shows moderately atrophic vaginal mucosa, cuff intact.  Bimanual exam reveals cuff intact, no nodularity or masses.  Rectovaginal exam confirms these findings.  Laboratory & Radiologic Studies: None new  Assessment & Plan: Shannon Scott is a 79 y.o. woman with recurrent endometrial cancer, currently managed on Pembro, who presents for multiple weeks of vulvar symptoms.  The patient has vulvar erythema and associated symptoms that I suspect may be related to a Pembrolizumab induced dermatitis. The appearance of her vulva is similar to lichen sclerosis (from an erythema standpoint) although she has no thinning of the skin or loss of architecture. I  recommended that we start with a 2 week course of daily topical steroid - this should improve symptoms related to a medication induced dermatitis or lichen sclerosis. If no improvement with topical steroids, discussed that we may try topical estrogen at some future time.  The patient has been on [place] now for almost 2 years. With the exception of some dermatologic side effects, she is tolerating treatment very well and had good response on imaging. She will see me again in 3 months and follows regularly with Dr. Marin Olp for her infusions.  25 minutes of total time was spent for this patient encounter, including preparation, face-to-face counseling with the patient and coordination of care, and documentation of the encounter.  Jeral Pinch, MD  Division of Gynecologic Oncology  Department of Obstetrics and Gynecology  [facility] of [facility]

## 2020-01-03 NOTE — Patient Instructions (Signed)
I suspect that your vulvar symptoms are related to irritation, which I think may be in the setting of Keytruda.  I am going to reach out to a dermatologist to see if they have seen this before.  I also believe that your other skin lesions may be related to the [facility].  I will reach out to Dr. Marin Olp.  I will see you back in 3 months.

## 2020-01-10 IMAGING — CT CT BIOPSY
1 of 3 series · 14 of 32 positions shown, 19 images · non-contrast
Comparison: none

INDICATION: 76-year-old female with a history of locally invasive exophytic
sigmoid colonic mass highly concerning for adenocarcinoma. She has
not undergone prior biopsy and presents for CT-guided biopsy to
obtain tissue diagnosis.

[Series 2: i-spiral 5.0 b31f · axial · 0.84mm/px · z∈[-265,-128]mm · 14 of 45 slices shown, 19 images]
[im 3/45  soft-tissue]
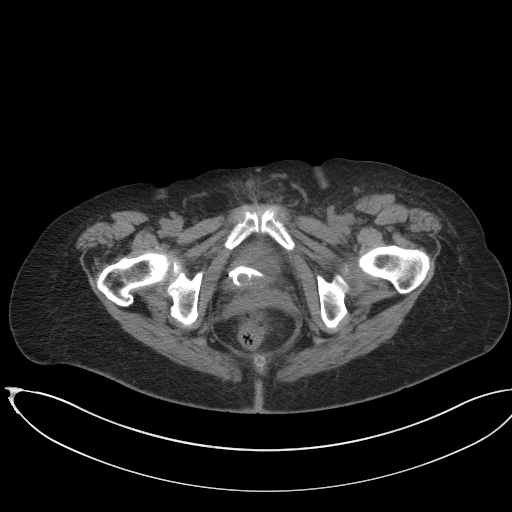
[im 3/45  bone]
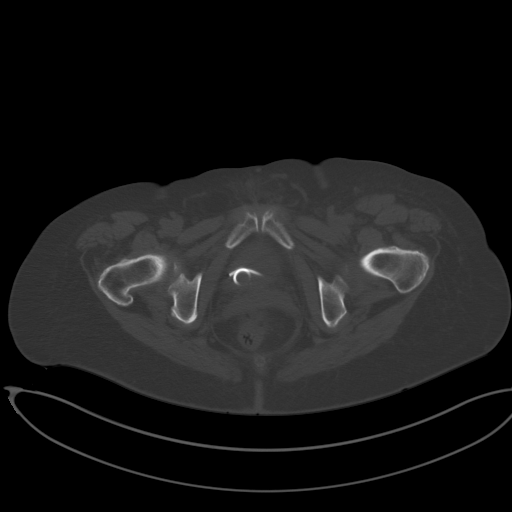
[im 6/45  soft-tissue]
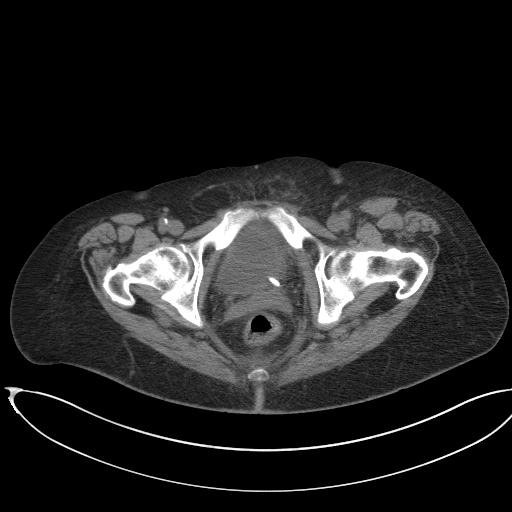
[im 11/45  soft-tissue]
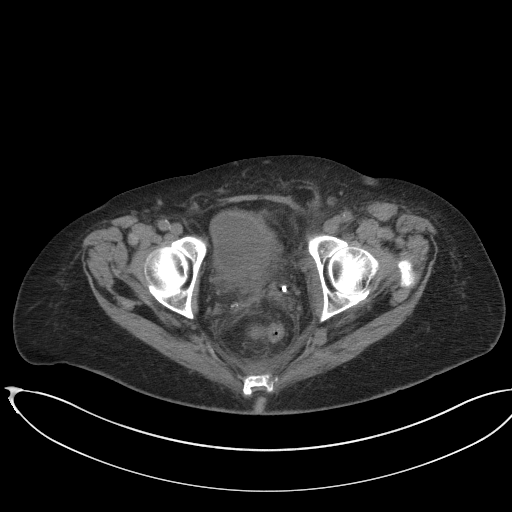
[im 13/45  soft-tissue]
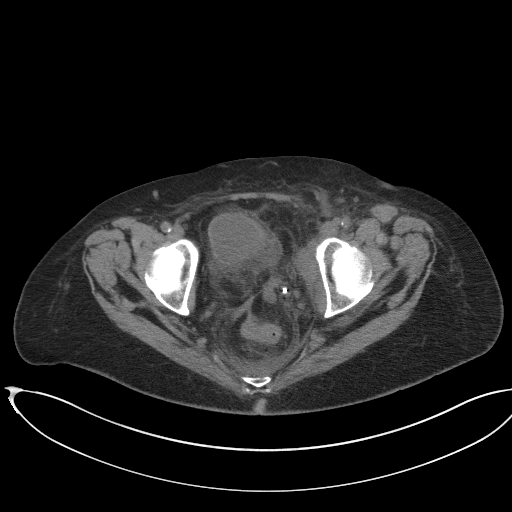
[im 16/45  soft-tissue]
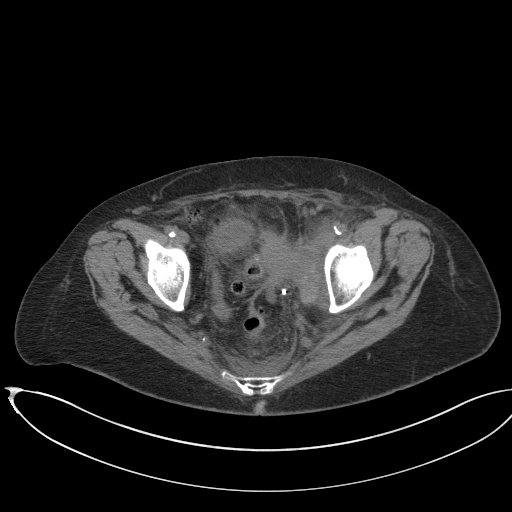
[im 19/45  soft-tissue]
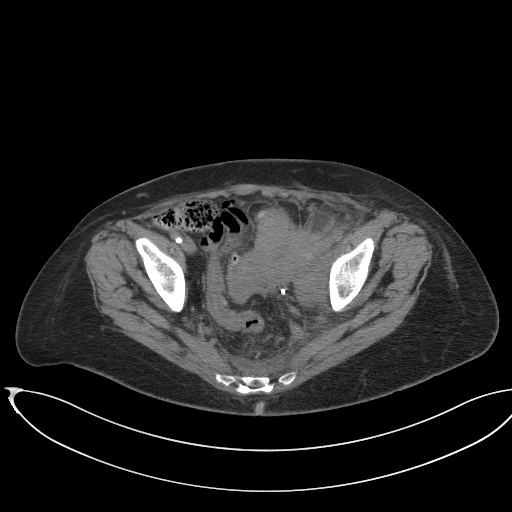
[im 24/45  soft-tissue]
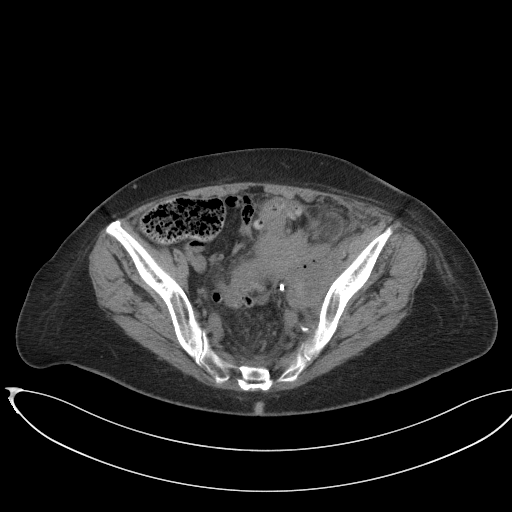
[im 26/45  soft-tissue]
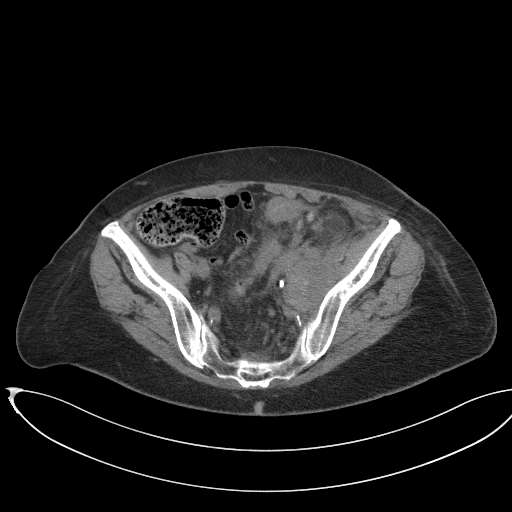
[im 29/45  soft-tissue]
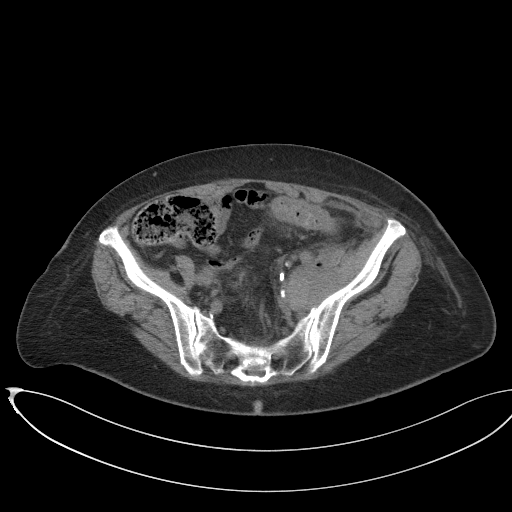
[im 29/45  bone]
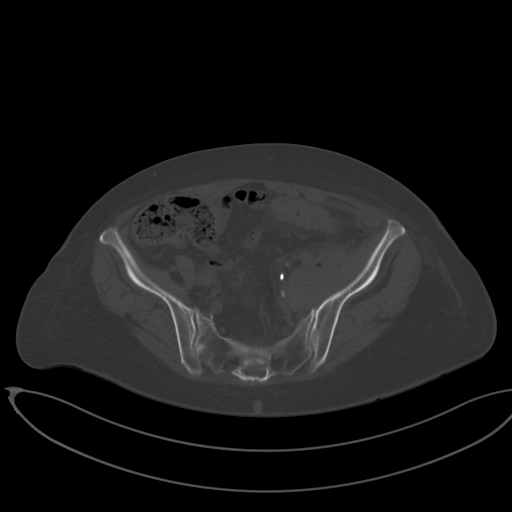
[im 32/45  soft-tissue]
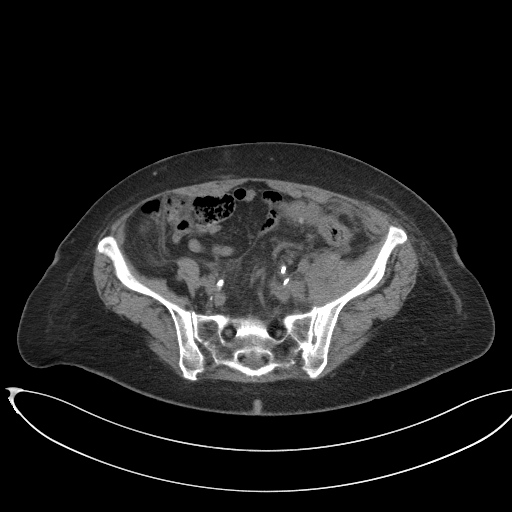
[im 34/45  soft-tissue]
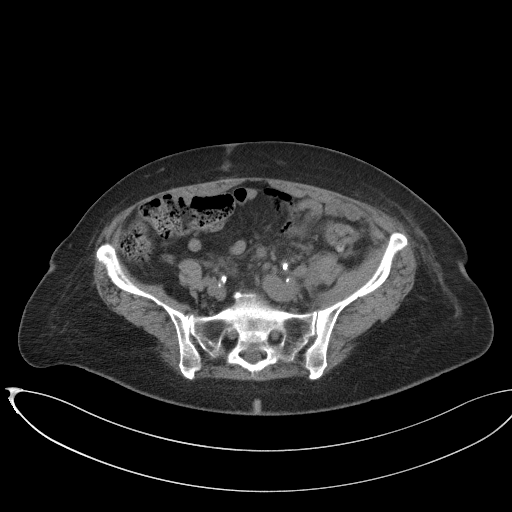
[im 34/45  lung]
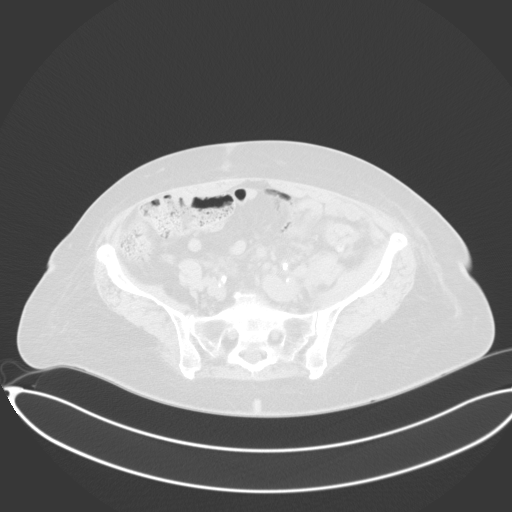
[im 37/45  lung]
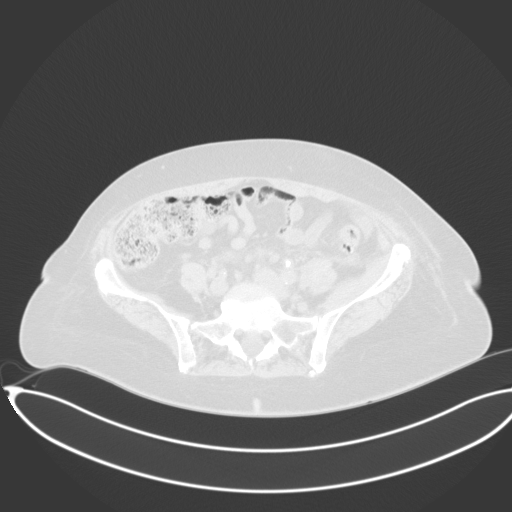
[im 39/45  soft-tissue]
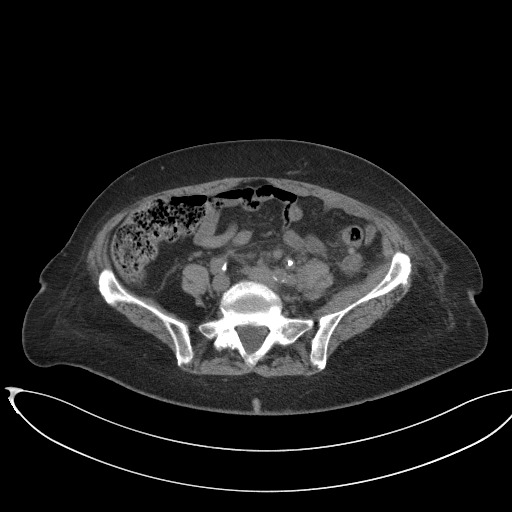
[im 39/45  lung]
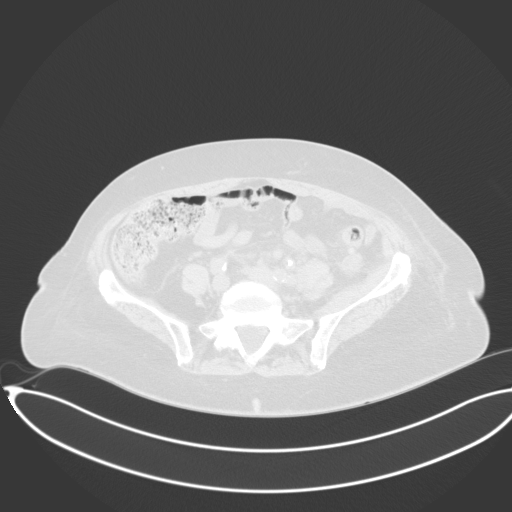
[im 42/45  soft-tissue]
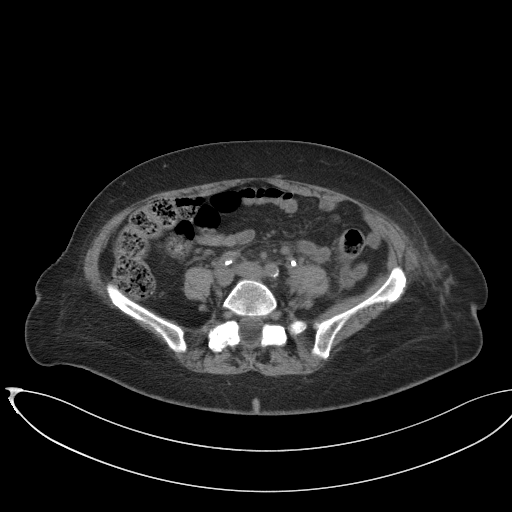
[im 42/45  lung]
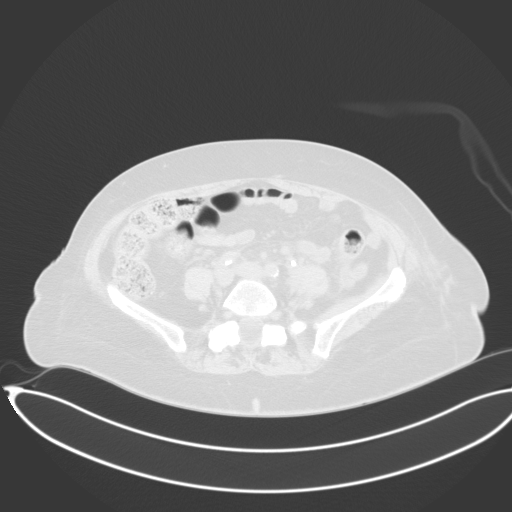

[14 of 32 positions shown; findings below may reference images not displayed]

EXAM:
CT guided tissue biopsy of left pelvic mass

MEDICATIONS:
None.

ANESTHESIA/SEDATION:
Moderate (conscious) sedation was employed during this procedure. A
total of Versed 2 mg and Fentanyl 100 mcg was administered
intravenously.

Moderate Sedation Time: 12 minutes. The patient's level of
consciousness and vital signs were monitored continuously by
radiology nursing throughout the procedure under my direct
supervision.

FLUOROSCOPY TIME:  Fluoroscopy Time: 0 minutes 0 seconds (0 mGy).

COMPLICATIONS:
None immediate.

PROCEDURE:
Informed written consent was obtained from the patient after a
thorough discussion of the procedural risks, benefits and
alternatives. All questions were addressed. A timeout was performed
prior to the initiation of the procedure.

A planning axial CT scan was performed. The exophytic mass was
identified in the left lower quadrant. A suitable skin entry site
was selected medial to the inferior epigastric vessels. The
overlying skin was prepped and marked. Under intermittent CT
guidance, a 17 gauge introducer needle was advanced into the margin
of the mass. Multiple 18 gauge core biopsies were then coaxially
obtained and placed in formalin before being delivered to pathology
for further analysis. The introducer needle was removed. Post biopsy
axial CT imaging demonstrates no evidence of immediate complication.
Patient tolerated the procedure well.
IMPRESSION: Technically successful CT-guided biopsy of left pelvic mass.

## 2020-01-16 ENCOUNTER — Ambulatory Visit: Payer: Medicare Other

## 2020-01-16 ENCOUNTER — Other Ambulatory Visit: Payer: Medicare Other

## 2020-01-16 ENCOUNTER — Ambulatory Visit: Payer: Medicare Other | Admitting: Family

## 2020-01-16 IMAGING — US IR US GUIDE VASC ACCESS RIGHT
1 series · 1 of 1 positions shown · non-contrast
Comparison: None.

ADDENDUM:
The port was secured to the chest wall with suture.
INDICATION: 76-year-old with history of uterine cancer. Recently biopsied pelvic
mass demonstrates a poorly differentiated carcinoma. Patient needs
Port-A-Cath for chemotherapy.

EXAM:
FLUOROSCOPIC AND ULTRASOUND GUIDED PLACEMENT OF A SUBCUTANEOUS PORT

[Series 1: ir us guide vasc access right · 0.08mm/px · 1 of 1 slices shown]
[im 1/1]
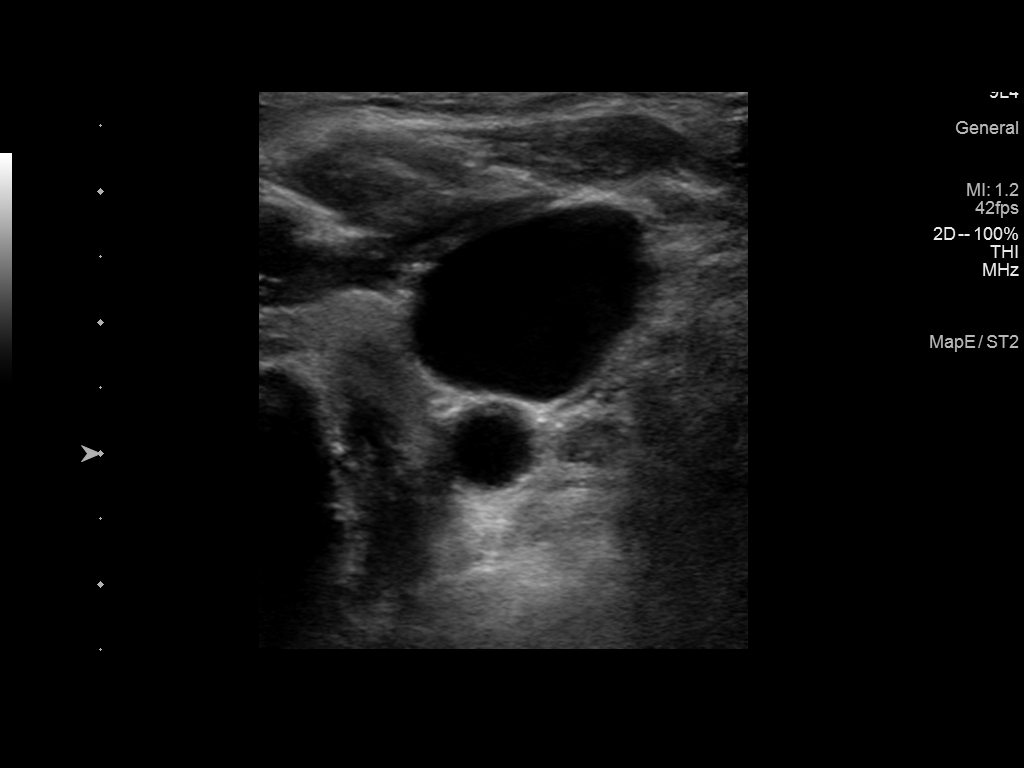

[1 of 1 positions shown; findings below may reference images not displayed]

MEDICATIONS:
Ancef 2 g; The antibiotic was administered within an appropriate
time interval prior to skin puncture.

ANESTHESIA/SEDATION:
Versed 2.0 mg IV; Fentanyl 100 mcg IV;

Moderate Sedation Time:  35 minutes

The patient was continuously monitored during the procedure by the
interventional radiology nurse under my direct supervision.

FLUOROSCOPY TIME:  54 seconds, 3 mGy

COMPLICATIONS:
None immediate.

PROCEDURE:
The procedure, risks, benefits, and alternatives were explained to
the patient. Questions regarding the procedure were encouraged and
answered. The patient understands and consents to the procedure.

Patient was placed supine on the interventional table. Ultrasound
confirmed a patent right internal jugular vein. The right chest and
neck were cleaned with a skin antiseptic and a sterile drape was
placed. Maximal barrier sterile technique was utilized including
caps, mask, sterile gowns, sterile gloves, sterile drape, hand
hygiene and skin antiseptic. The right neck was anesthetized with 1%
lidocaine. Small incision was made in the right neck with a blade.
Micropuncture set was placed in the right internal jugular vein with
ultrasound guidance. The micropuncture wire was used for measurement
purposes. The right chest was anesthetized with 1% lidocaine with
epinephrine. #15 blade was used to make an incision and a
subcutaneous port pocket was formed. 8 french Power Port was
assembled. Subcutaneous tunnel was formed with a stiff tunneling
device. The port catheter was brought through the subcutaneous
tunnel. The port was placed in the subcutaneous pocket. The
micropuncture set was exchanged for a peel-away sheath. The catheter
was placed through the peel-away sheath and the tip was positioned
near the superior cavoatrial junction. Catheter placement was
confirmed with fluoroscopy. The port was accessed and flushed with
saline. The port pocket was closed using two layers of absorbable
sutures and Dermabond. The vein skin site was closed using a single
layer of absorbable suture and Dermabond. Sterile dressings were
applied. Patient tolerated the procedure well without an immediate
complication.

Ultrasound confirmed a patent right internal jugular vein.
Ultrasound was used for vascular guidance. Ultrasound image was
saved for documentation.

Fluoroscopic images were taken and saved for this procedure.
IMPRESSION: Placement of a subcutaneous port device. This is a CT injectable
Port-A-Cath.

## 2020-01-25 ENCOUNTER — Other Ambulatory Visit: Payer: Self-pay | Admitting: Hematology & Oncology

## 2020-01-25 DIAGNOSIS — M549 Dorsalgia, unspecified: Secondary | ICD-10-CM

## 2020-01-25 DIAGNOSIS — C541 Malignant neoplasm of endometrium: Secondary | ICD-10-CM

## 2020-01-25 DIAGNOSIS — M545 Low back pain, unspecified: Secondary | ICD-10-CM

## 2020-01-25 DIAGNOSIS — M792 Neuralgia and neuritis, unspecified: Secondary | ICD-10-CM

## 2020-01-25 DIAGNOSIS — G8929 Other chronic pain: Secondary | ICD-10-CM

## 2020-02-05 ENCOUNTER — Encounter: Payer: Self-pay | Admitting: Family

## 2020-02-05 ENCOUNTER — Other Ambulatory Visit: Payer: Self-pay

## 2020-02-05 ENCOUNTER — Inpatient Hospital Stay (HOSPITAL_BASED_OUTPATIENT_CLINIC_OR_DEPARTMENT_OTHER): Payer: Medicare Other | Admitting: Family

## 2020-02-05 ENCOUNTER — Inpatient Hospital Stay: Payer: Medicare Other | Attending: Hematology & Oncology

## 2020-02-05 ENCOUNTER — Inpatient Hospital Stay: Payer: Medicare Other

## 2020-02-05 VITALS — BP 158/56 | HR 69 | Temp 98.4°F | Resp 17 | Ht 61.0 in | Wt 144.2 lb

## 2020-02-05 DIAGNOSIS — D508 Other iron deficiency anemias: Secondary | ICD-10-CM

## 2020-02-05 DIAGNOSIS — C541 Malignant neoplasm of endometrium: Secondary | ICD-10-CM

## 2020-02-05 DIAGNOSIS — E032 Hypothyroidism due to medicaments and other exogenous substances: Secondary | ICD-10-CM

## 2020-02-05 DIAGNOSIS — Z79899 Other long term (current) drug therapy: Secondary | ICD-10-CM | POA: Insufficient documentation

## 2020-02-05 DIAGNOSIS — C799 Secondary malignant neoplasm of unspecified site: Secondary | ICD-10-CM

## 2020-02-05 DIAGNOSIS — R5383 Other fatigue: Secondary | ICD-10-CM | POA: Diagnosis not present

## 2020-02-05 DIAGNOSIS — M7989 Other specified soft tissue disorders: Secondary | ICD-10-CM | POA: Diagnosis not present

## 2020-02-05 DIAGNOSIS — Z5112 Encounter for antineoplastic immunotherapy: Secondary | ICD-10-CM | POA: Diagnosis not present

## 2020-02-05 DIAGNOSIS — C539 Malignant neoplasm of cervix uteri, unspecified: Secondary | ICD-10-CM

## 2020-02-05 LAB — CBC WITH DIFFERENTIAL (CANCER CENTER ONLY)
Abs Immature Granulocytes: 0.01 10*3/uL (ref 0.00–0.07)
Basophils Absolute: 0 10*3/uL (ref 0.0–0.1)
Basophils Relative: 1 %
Eosinophils Absolute: 0 10*3/uL (ref 0.0–0.5)
Eosinophils Relative: 0 %
HCT: 36.1 % (ref 36.0–46.0)
Hemoglobin: 11.7 g/dL — ABNORMAL LOW (ref 12.0–15.0)
Immature Granulocytes: 0 %
Lymphocytes Relative: 11 %
Lymphs Abs: 0.6 10*3/uL — ABNORMAL LOW (ref 0.7–4.0)
MCH: 29.5 pg (ref 26.0–34.0)
MCHC: 32.4 g/dL (ref 30.0–36.0)
MCV: 91.2 fL (ref 80.0–100.0)
Monocytes Absolute: 0.3 10*3/uL (ref 0.1–1.0)
Monocytes Relative: 6 %
Neutro Abs: 4 10*3/uL (ref 1.7–7.7)
Neutrophils Relative %: 82 %
Platelet Count: 274 10*3/uL (ref 150–400)
RBC: 3.96 MIL/uL (ref 3.87–5.11)
RDW: 13.6 % (ref 11.5–15.5)
WBC Count: 4.8 10*3/uL (ref 4.0–10.5)
nRBC: 0 % (ref 0.0–0.2)

## 2020-02-05 LAB — CMP (CANCER CENTER ONLY)
ALT: 7 U/L (ref 0–44)
AST: 13 U/L — ABNORMAL LOW (ref 15–41)
Albumin: 3.8 g/dL (ref 3.5–5.0)
Alkaline Phosphatase: 73 U/L (ref 38–126)
Anion gap: 8 (ref 5–15)
BUN: 14 mg/dL (ref 8–23)
CO2: 27 mmol/L (ref 22–32)
Calcium: 9.5 mg/dL (ref 8.9–10.3)
Chloride: 103 mmol/L (ref 98–111)
Creatinine: 0.89 mg/dL (ref 0.44–1.00)
GFR, Est AFR Am: >60 mL/min (ref >60)
GFR, Estimated: >60 mL/min (ref >60)
Glucose, Bld: 102 mg/dL — ABNORMAL HIGH (ref 70–99)
Potassium: 4 mmol/L (ref 3.5–5.1)
Sodium: 138 mmol/L (ref 135–145)
Total Bilirubin: 0.3 mg/dL (ref 0.3–1.2)
Total Protein: 6.8 g/dL (ref 6.5–8.1)

## 2020-02-05 MED ORDER — ZOLEDRONIC ACID 4 MG/100ML IV SOLN: 4.0000 mg | Freq: Once | INTRAVENOUS | Status: DC

## 2020-02-05 MED ORDER — HEPARIN SOD (PORK) LOCK FLUSH 100 UNIT/ML IV SOLN
500.0000 [IU] | Freq: Once | INTRAVENOUS | Status: AC | PRN
  Administered 2020-02-05: 500 [IU]
  Filled 2020-02-05: qty 5

## 2020-02-05 MED ORDER — SODIUM CHLORIDE 0.9 % IV SOLN
Freq: Once | INTRAVENOUS | Status: AC
  Filled 2020-02-05: qty 250

## 2020-02-05 MED ORDER — SODIUM CHLORIDE 0.9% FLUSH
10.0000 mL | INTRAVENOUS | Status: DC | PRN
  Administered 2020-02-05: 10 mL
  Filled 2020-02-05: qty 10

## 2020-02-05 MED ORDER — ZOLEDRONIC ACID 4 MG/5ML IV CONC
3.3000 mg | Freq: Once | INTRAVENOUS | Status: AC
  Administered 2020-02-05: 3.3 mg via INTRAVENOUS
  Filled 2020-02-05: qty 4.13

## 2020-02-05 MED ORDER — SODIUM CHLORIDE 0.9 % IV SOLN
400.0000 mg | Freq: Once | INTRAVENOUS | Status: AC
  Administered 2020-02-05: 400 mg via INTRAVENOUS
  Filled 2020-02-05: qty 16

## 2020-02-05 NOTE — Patient Instructions (Signed)

## 2020-02-05 NOTE — Patient Instructions (Signed)
Pembrolizumab injection What is this medicine? PEMBROLIZUMAB (pem broe liz ue mab) is a monoclonal antibody. It is used to treat certain types of cancer. This medicine may be used for other purposes; ask your health care provider or pharmacist if you have questions. COMMON BRAND NAME(S): Keytruda What should I tell my health care provider before I take this medicine? They need to know if you have any of these conditions:  diabetes  immune system problems  inflammatory bowel disease  liver disease  lung or breathing disease  lupus  received or scheduled to receive an organ transplant or a stem-cell transplant that uses donor stem cells  an unusual or allergic reaction to pembrolizumab, other medicines, foods, dyes, or preservatives  pregnant or trying to get pregnant  breast-feeding How should I use this medicine? This medicine is for infusion into a vein. It is given by a health care professional in a hospital or clinic setting. A special MedGuide will be given to you before each treatment. Be sure to read this information carefully each time. Talk to your pediatrician regarding the use of this medicine in children. While this drug may be prescribed for children as young as 6 months for selected conditions, precautions do apply. Overdosage: If you think you have taken too much of this medicine contact a poison control center or emergency room at once. NOTE: This medicine is only for you. Do not share this medicine with others. What if I miss a dose? It is important not to miss your dose. Call your doctor or health care professional if you are unable to keep an appointment. What may interact with this medicine? Interactions have not been studied. Give your health care provider a list of all the medicines, herbs, non-prescription drugs, or dietary supplements you use. Also tell them if you smoke, drink alcohol, or use illegal drugs. Some items may interact with your medicine. This  list may not describe all possible interactions. Give your health care provider a list of all the medicines, herbs, non-prescription drugs, or dietary supplements you use. Also tell them if you smoke, drink alcohol, or use illegal drugs. Some items may interact with your medicine. What should I watch for while using this medicine? Your condition will be monitored carefully while you are receiving this medicine. You may need blood work done while you are taking this medicine. Do not become pregnant while taking this medicine or for 4 months after stopping it. Women should inform their doctor if they wish to become pregnant or think they might be pregnant. There is a potential for serious side effects to an unborn child. Talk to your health care professional or pharmacist for more information. Do not breast-feed an infant while taking this medicine or for 4 months after the last dose. What side effects may I notice from receiving this medicine? Side effects that you should report to your doctor or health care professional as soon as possible:  allergic reactions like skin rash, itching or hives, swelling of the face, lips, or tongue  bloody or black, tarry  breathing problems  changes in vision  chest pain  chills  confusion  constipation  cough  diarrhea  dizziness or feeling faint or lightheaded  fast or irregular heartbeat  fever  flushing  joint pain  low blood counts - this medicine may decrease the number of white blood cells, red blood cells and platelets. You may be at increased risk for infections and bleeding.  muscle pain  muscle  weakness  pain, tingling, numbness in the hands or feet  persistent headache  redness, blistering, peeling or loosening of the skin, including inside the mouth  signs and symptoms of high blood sugar such as dizziness; dry mouth; dry skin; fruity breath; nausea; stomach pain; increased hunger or thirst; increased urination  signs  and symptoms of kidney injury like trouble passing urine or change in the amount of urine  signs and symptoms of liver injury like dark urine, light-colored stools, loss of appetite, nausea, right upper belly pain, yellowing of the eyes or skin  sweating  swollen lymph nodes  weight loss Side effects that usually do not require medical attention (report to your doctor or health care professional if they continue or are bothersome):  decreased appetite  hair loss  muscle pain  tiredness This list may not describe all possible side effects. Call your doctor for medical advice about side effects. You may report side effects to FDA at 1-800-FDA-1088. Where should I keep my medicine? This drug is given in a hospital or clinic and will not be stored at home. NOTE: This sheet is a summary. It may not cover all possible information. If you have questions about this medicine, talk to your doctor, pharmacist, or health care provider.  2020 Elsevier/Gold Standard (2019-03-23 18:07:58)

## 2020-02-05 NOTE — Progress Notes (Signed)
Hematology and Oncology Follow Up Visit  Shannon Scott [id number] 04/26/1941 79 y.o. 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 She is doing fairly well but has had some fatigue and has felt a little off balance and like her legs are heavy when ambulating. She is using a cane for added support and denies and falls or syncope.  She was able to see her gynecologist, Dr. Berline Lopes, and got Kenalog ointment which has helped her vaginal rash.  She states that the intermittent sandpaper rash on her legs, arms and back seems to have improved since her treatments have been moved to every 8 weeks.  She has swelling in her left leg and has not been able to get on her compression stocking. She has tried wrapping it in the past with some success and will try this again. Pedal pulses are 2+.  No fever, chills, n/v, cough, dizziness, SOB, chest pain, palpitations, abdominal pain or changes in bowel or bladder habits.  She has not noted any blood loss. No abnormal bruising, no petechiae.  She states that her ostomy is operating appropriately.  She states that she is eating well but admits that she needs to better hydrated. Her weight is stable at 144 lbs.   ECOG Performance Status: 1 - Symptomatic but completely ambulatory  Medications:  Allergies as of 02/05/2020      Reactions   Statins Other (See Comments)   Myalgias and memory problems   Ciprofloxacin Itching   Splotchy redness with itching during IV infusion localized to arm.       Medication List       Accurate as of February 05, 2020 11:15 AM. If you have any questions, ask your nurse or doctor.        STOP taking these medications   amoxicillin-clavulanate 875-125 MG tablet Commonly known as: Augmentin Stopped by: Laverna Peace, NP     TAKE these medications   amoxicillin 500 MG tablet Commonly known as: AMOXIL Take four tablets prior to dental procedure   diclofenac sodium 1 % Gel Commonly known as: VOLTAREN Apply 2 g topically 4 (four) times daily.   diltiazem 300 MG 24 hr capsule Commonly known as: TIAZAC TAKE 1 CAPSULE(300 MG) BY MOUTH DAILY What changed: See the new instructions.   gabapentin 100 MG capsule Commonly known as: Neurontin Take 1 capsule (100 mg total) by mouth 2 (two) times daily.   ibuprofen 200 MG tablet Commonly known as: ADVIL Take 200 mg by mouth daily as needed (back pain).   lactose free nutrition Liqd Take 237 mLs by mouth 3 (three) times daily with meals.   levothyroxine 50 MCG tablet Commonly known as: SYNTHROID Take 1 tablet (50 mcg total) by mouth daily before breakfast.   lidocaine-prilocaine cream Commonly known as: EMLA Apply 1 application topically as needed.   lipase/protease/amylase 36000 UNITS Cpep capsule Commonly known as: Creon Take 2 capsules (72,000 Units total) by mouth 3 (three) times daily before meals.   nystatin cream Commonly known as: MYCOSTATIN Apply  1 application topically 2 (two) times daily.   ondansetron 8 MG tablet Commonly known as: ZOFRAN TAKE 1 TABLET(8 MG) BY MOUTH EVERY 8 HOURS AS NEEDED FOR NAUSEA OR VOMITING   pantoprazole 40 MG tablet Commonly known as: PROTONIX Take 1 tablet (40 mg total) by mouth daily.   polyethylene glycol 17 g packet Commonly known as: MIRALAX / GLYCOLAX Take 17 g by mouth daily.   prochlorperazine 10 MG tablet Commonly known as: COMPAZINE Take 1 tablet (10 mg total) by mouth every 6 (six) hours as needed for nausea or vomiting.    senna-docusate 8.6-50 MG tablet Commonly known as: Senokot-S Take 1 tablet by mouth 2 (two) times daily.   traMADol 50 MG tablet Commonly known as: ULTRAM TAKE 2 TABLETS(100 MG) BY MOUTH TWICE DAILY   triamcinolone ointment 0.5 % Commonly known as: KENALOG Apply topically daily. Use for 2 weeks       Allergies:  Allergies  Allergen Reactions  . Statins Other (See Comments)    Myalgias and memory problems  . Ciprofloxacin Itching    Splotchy redness with itching during IV infusion localized to arm.    Past Medical History, Surgical history, Social history, and Family History were reviewed and updated.  Review of Systems: All other 10 point review of systems is negative.   Physical Exam:  height is _0  (1.549 m) and weight is 144 lb 3.2 oz (65.4 kg). Her oral temperature is 98.4 F (36.9 C). Her blood pressure is 158/56 (abnormal) and her pulse is 69. Her respiration is 17 and oxygen saturation is 98%.   Wt Readings from Last 3 Encounters:  02/05/20 144 lb 3.2 oz (65.4 kg)  01/03/20 147 lb (66.7 kg)  12/04/19 145 lb (65.8 kg)    Ocular: Sclerae unicteric, pupils equal, round and reactive to light Ear-nose-throat: Oropharynx clear, dentition fair Lymphatic: No cervical or supraclavicular adenopathy Lungs no rales or rhonchi, good excursion bilaterally Heart regular rate and rhythm, no murmur appreciated Abd soft, nontender, positive bowel sounds, no liver or spleen tip palpated on exam, no fluid wave  MSK no focal spinal tenderness, no joint edema Neuro: non-focal, well-oriented, appropriate affect Breasts: Deferred   Lab Results  Component Value Date   WBC 4.8 02/05/2020   HGB 11.7 (L) 02/05/2020   HCT 36.1 02/05/2020   MCV 91.2 02/05/2020   PLT 274 02/05/2020   Lab Results  Component Value Date   FERRITIN 448 (H) 12/04/2019   IRON 49 12/04/2019   TIBC 186 (L) 12/04/2019   UIBC 137 12/04/2019   IRONPCTSAT 26 12/04/2019   Lab Results  Component Value  Date   RETICCTPCT 0.5 (L) 10/20/2017   RBC 3.96 02/05/2020   No results found for: KPAFRELGTCHN, LAMBDASER, KAPLAMBRATIO No results found for: IGGSERUM, IGA, IGMSERUM No results found for: Odetta Pink, SPEI   Chemistry      Component Value Date/Time   NA 138 02/05/2020 1045   NA 136 (A) 09/08/2017 0000   K 4.0 02/05/2020 1045   CL 103 02/05/2020 1045   CO2 27 02/05/2020 1045   BUN 14 02/05/2020 1045   BUN 8 09/08/2017 0000   CREATININE 0.89 02/05/2020 1045   CREATININE 0.69 08/02/2016 1519   GLU 111 09/08/2017 0000      Component Value Date/Time   CALCIUM 9.5 02/05/2020 1045   ALKPHOS 73 02/05/2020 1045   AST 13 (L) 02/05/2020 1045   ALT 7 02/05/2020 1045   BILITOT  0.3 02/05/2020 1045       Impression and Plan: Shannon Scott is a very pleasant 79 yo caucasian female withrecurrent undifferentiated endometrial carcinoma. We will proceed with treatment today as planned and then go to every 12 week appointments (ok with Dr. Marin Olp).  We will get repeat CT scans of the chest abdomen and pelvis to assess her response to treatment.  We will also get and US of the left leg to r/o thrombus.  Iron studies are pending. We will replace if needed.  They are in agreement with the plan and will contact our office with any questions or concerns.   Laverna Peace, NP 9/7/202111:15 AM

## 2020-02-06 ENCOUNTER — Other Ambulatory Visit: Payer: Self-pay | Admitting: Family

## 2020-02-06 ENCOUNTER — Encounter: Payer: Self-pay | Admitting: Family

## 2020-02-06 DIAGNOSIS — C541 Malignant neoplasm of endometrium: Secondary | ICD-10-CM

## 2020-02-06 DIAGNOSIS — M7989 Other specified soft tissue disorders: Secondary | ICD-10-CM

## 2020-02-06 LAB — IRON AND TIBC
Iron: 52 ug/dL (ref 41–142)
Saturation Ratios: 26 % (ref 21–57)
TIBC: 196 ug/dL — ABNORMAL LOW (ref 236–444)
UIBC: 144 ug/dL (ref 120–384)

## 2020-02-06 LAB — TSH: TSH: 2.92 u[IU]/mL (ref 0.308–3.960)

## 2020-02-06 LAB — FERRITIN: Ferritin: 512 ng/mL — ABNORMAL HIGH (ref 11–307)

## 2020-02-06 LAB — LACTATE DEHYDROGENASE: LDH: 134 U/L (ref 98–192)

## 2020-02-20 ENCOUNTER — Ambulatory Visit (HOSPITAL_COMMUNITY): Payer: Medicare Other

## 2020-03-03 ENCOUNTER — Other Ambulatory Visit (HOSPITAL_COMMUNITY): Payer: Self-pay | Admitting: Podiatry

## 2020-03-03 ENCOUNTER — Ambulatory Visit (INDEPENDENT_AMBULATORY_CARE_PROVIDER_SITE_OTHER): Payer: Medicare Other | Admitting: Podiatry

## 2020-03-03 ENCOUNTER — Other Ambulatory Visit: Payer: Self-pay

## 2020-03-03 DIAGNOSIS — I890 Lymphedema, not elsewhere classified: Secondary | ICD-10-CM | POA: Diagnosis not present

## 2020-03-03 DIAGNOSIS — B351 Tinea unguium: Secondary | ICD-10-CM | POA: Diagnosis not present

## 2020-03-03 DIAGNOSIS — M79674 Pain in right toe(s): Secondary | ICD-10-CM

## 2020-03-03 DIAGNOSIS — R609 Edema, unspecified: Secondary | ICD-10-CM | POA: Diagnosis not present

## 2020-03-03 DIAGNOSIS — M79675 Pain in left toe(s): Secondary | ICD-10-CM | POA: Diagnosis not present

## 2020-03-03 DIAGNOSIS — R600 Localized edema: Secondary | ICD-10-CM

## 2020-03-03 DIAGNOSIS — R234 Changes in skin texture: Secondary | ICD-10-CM

## 2020-03-03 DIAGNOSIS — M79605 Pain in left leg: Secondary | ICD-10-CM

## 2020-03-04 ENCOUNTER — Ambulatory Visit (HOSPITAL_COMMUNITY)
Admission: RE | Admit: 2020-03-04 | Discharge: 2020-03-04 | Disposition: A | Discharge status: Home / Self Care | Payer: Medicare Other | Source: Ambulatory Visit | Attending: Vascular Surgery | Admitting: Vascular Surgery

## 2020-03-04 DIAGNOSIS — R600 Localized edema: Secondary | ICD-10-CM | POA: Diagnosis present

## 2020-03-04 DIAGNOSIS — M79605 Pain in left leg: Secondary | ICD-10-CM | POA: Diagnosis present

## 2020-03-06 ENCOUNTER — Other Ambulatory Visit (HOSPITAL_COMMUNITY): Payer: Medicare Other

## 2020-03-11 NOTE — Progress Notes (Signed)
Subjective:   Patient ID: Shannon Scott, female   DOB: 79 y.o.   MRN: [id number]   HPI 79 year old female presents the office today for concerns of thick, discolored toenails that are causing discomfort mostly on her big toenails.  She denies any swelling or redness or any drainage from the toenail sites.  She also has a sore between her left fourth and fifth toes that cause discomfort.  She has chronic swelling of the left leg, lymphedema but the swelling has become worse.  She has not had an ultrasound of the leg in some time.  She denies any open sores and she has no other concerns today.   Review of Systems  All other systems reviewed and are negative.  Past Medical History:  Diagnosis Date  . Arthritis   . Asthma    allergy induced  . Bilateral carotid artery disease (HCC)    L carotid bruit  . Bursitis    left hip  . CAD (coronary artery disease)   . Cancer ([place])   . Dyslipidemia    intolerant to statins, welchol, niacin, zetia  . Goals of care, counseling/discussion 10/11/2017  . History of blood transfusion   . History of nuclear stress test 04/24/2012   lexiscan; normal study  . Hypertension   . Hypothyroidism   . 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 Postoperative nausea and vomiting 01/02/2016    Past Surgical History:  Procedure Laterality Date  . ABDOMINAL HYSTERECTOMY    . Carotid Doppler  02/2012   40-59% right int carotid artery stenosis; 60-79% L int carotid stenosis; L carotid bruit  . CORONARY ARTERY BYPASS GRAFT  03/12/2004   LIMA to LAD, SVG to circumflex, SVG to PDA  . CYSTOSCOPY W/ URETERAL STENT PLACEMENT Left 12/06/2017   Procedure: CYSTOSCOPY WITH LEFT URETERAL STENT EXCHANGE;  Surgeon: Irine Seal, MD;  Location: [facility];  Service: Urology;  Laterality: Left;  . CYSTOSCOPY WITH STENT PLACEMENT Bilateral 08/21/2017   Procedure: CYSTOSCOPY WITH STENT PLACEMENT;  Surgeon: Ceasar Mons, MD;  Location: [facility];  Service: Urology;  Laterality: Bilateral;  . IR FLUORO GUIDE PORT INSERTION RIGHT  10/11/2017  . IR GENERIC HISTORICAL  04/29/2016   IR RADIOLOGIST EVAL & MGMT 04/29/2016 Sandi Mariscal, MD [facility] INTER[facility] RAD  . IR GENERIC HISTORICAL  05/12/2016   IR RADIOLOGIST EVAL & MGMT 05/12/2016 Sandi Mariscal, MD [facility] INTER[facility] RAD  . IR IVC FILTER PLMT / S&I /IMG GUID/MOD SED  10/11/2017  . IR US GUIDE VASC ACCESS RIGHT  10/11/2017  . ROBOTIC ASSISTED TOTAL HYSTERECTOMY WITH BILATERAL SALPINGO OOPHERECTOMY Bilateral 12/30/2015   Procedure: XI ROBOTIC ASSISTED TOTAL HYSTERECTOMY WITH BILATERAL SALPINGO OOPHORECTOMY WITH SENTAL LYMPH NODE BIOPSY;  Surgeon: Nancy Marus, MD;  Location: [facility];  Service: Gynecology;  Laterality: Bilateral;  . TONSILLECTOMY    . TRANSTHORACIC ECHOCARDIOGRAM  04/2007   EF>55%; mild MR; mild-mod TR; mild pulm HTN; mild calcification of aortiv valve leaflets with mild valvular aortic stenosis  . TUBAL LIGATION       Current Outpatient Medications:  .  amoxicillin (AMOXIL) 500 MG tablet, Take four tablets prior to dental procedure (Patient not taking: Reported on 02/05/2020), Disp: 4 tablet, Rfl: 0 .  diclofenac sodium (VOLTAREN) 1 % GEL, Apply 2 g topically 4 (four) times daily., Disp: , Rfl:  .  diltiazem (TIAZAC) 300 MG 24 hr capsule, TAKE 1 CAPSULE(300 MG) BY MOUTH DAILY (Patient taking differently: Take 300  mg by mouth daily. ), Disp: 30 capsule, Rfl: 1 .  gabapentin (NEURONTIN) 100 MG capsule, Take 1 capsule (100 mg total) by mouth 2 (two) times daily. (Patient not taking: Reported on 02/05/2020), Disp: 60 capsule, Rfl: 3 .  ibuprofen (ADVIL,MOTRIN) 200 MG tablet, Take 200 mg by mouth daily as needed (back pain)., Disp: , Rfl:  .  lactose free nutrition (BOOST PLUS) LIQD, Take 237 mLs by mouth 3 (three) times daily with meals., Disp: , Rfl: 0 .  levothyroxine (SYNTHROID, LEVOTHROID) 50 MCG tablet, Take 1 tablet (50 mcg total) by mouth daily before breakfast.,  Disp: 30 tablet, Rfl: 1 .  lidocaine-prilocaine (EMLA) cream, Apply 1 application topically as needed. (Patient not taking: Reported on 02/05/2020), Disp: 30 g, Rfl: 6 .  lipase/protease/amylase (CREON) 36000 UNITS CPEP capsule, Take 2 capsules (72,000 Units total) by mouth 3 (three) times daily before meals. (Patient not taking: Reported on 02/05/2020), Disp: 180 capsule, Rfl: 6 .  nystatin cream (MYCOSTATIN), Apply 1 application topically 2 (two) times daily. (Patient not taking: Reported on 02/05/2020), Disp: 30 g, Rfl: 1 .  ondansetron (ZOFRAN) 8 MG tablet, TAKE 1 TABLET(8 MG) BY MOUTH EVERY 8 HOURS AS NEEDED FOR NAUSEA OR VOMITING (Patient not taking: Reported on 02/05/2020), Disp: 30 tablet, Rfl: 0 .  pantoprazole (PROTONIX) 40 MG tablet, Take 1 tablet (40 mg total) by mouth daily. (Patient not taking: Reported on 02/05/2020), Disp: 30 tablet, Rfl: 0 .  polyethylene glycol (MIRALAX / GLYCOLAX) packet, Take 17 g by mouth daily. (Patient not taking: Reported on 02/05/2020), Disp: 30 each, Rfl: 0 .  prochlorperazine (COMPAZINE) 10 MG tablet, Take 1 tablet (10 mg total) by mouth every 6 (six) hours as needed for nausea or vomiting. (Patient not taking: Reported on 02/05/2020), Disp: 30 tablet, Rfl: 3 .  senna-docusate (SENOKOT-S) 8.6-50 MG tablet, Take 1 tablet by mouth 2 (two) times daily. (Patient not taking: Reported on 02/05/2020), Disp: , Rfl:  .  traMADol (ULTRAM) 50 MG tablet, TAKE 2 TABLETS(100 MG) BY MOUTH TWICE DAILY, Disp: 120 tablet, Rfl: 0 .  triamcinolone ointment (KENALOG) 0.5 %, Apply topically daily. Use for 2 weeks (Patient not taking: Reported on 02/05/2020), Disp: 30 g, Rfl: 0 No current facility-administered medications for this visit.  Facility-Administered Medications Ordered in Other Visits:  .  sodium chloride flush (NS) 0.9 % injection 10 mL, 10 mL, Intracatheter, PRN, Volanda Napoleon, MD, 10 mL at 02/05/20 1440  Allergies  Allergen Reactions  . Statins Other (See Comments)    Myalgias  and memory problems  . Ciprofloxacin Itching    Splotchy redness with itching during IV infusion localized to arm.         Objective:  Physical Exam  General: AAO x3, NAD  Dermatological: Nails are hypertrophic, dystrophic with yellow-brown discoloration particular the big toenails also dystrophic the other nails as well.  Tenderness nails 1-5 bilaterally.  There is no edema, erythema or signs of infection.  Small skin fissure present in the left fourth and fifth toes with macerated tissue.  No drainage or pus or swelling or redness or any signs of infection.  There is no fluctuation crepitation.  There is no malodor.  Vascular: DP pulses 2/4 but difficult to palpate PT pulses given swelling.  Has chronic edema bilaterally left side worse than the right.  Neruologic: Grossly intact via light touch bilateral.   Musculoskeletal: There is no area of pinpoint tenderness.  Flexor, extensor tendons appear to be intact  Assessment:   79 year old female with symptomatic onychomycosis; skin fissure; swelling     Plan:  -Treatment options discussed including all alternatives, risks, and complications -Etiology of symptoms were discussed -I debrided the nails x10 without any complications or bleeding today.  In the future we discussed possible removal of the talus toenails which she desires. -However today and one order venous duplex rule DVT.  She is scheduled to have this done tomorrow morning.  Encourage elevation. -Castellani's paint Apply to the Left Fourth Interspace.  Dry thoroughly between the toes.  Trula Slade DPM

## 2020-03-17 ENCOUNTER — Other Ambulatory Visit: Payer: Self-pay

## 2020-03-17 ENCOUNTER — Ambulatory Visit (HOSPITAL_COMMUNITY)
Admission: RE | Admit: 2020-03-17 | Discharge: 2020-03-17 | Disposition: A | Discharge status: Home / Self Care | Payer: Medicare Other | Source: Ambulatory Visit | Attending: Family | Admitting: Family

## 2020-03-17 DIAGNOSIS — C541 Malignant neoplasm of endometrium: Secondary | ICD-10-CM | POA: Diagnosis present

## 2020-03-17 DIAGNOSIS — M7989 Other specified soft tissue disorders: Secondary | ICD-10-CM | POA: Diagnosis present

## 2020-03-17 LAB — POCT I-STAT CREATININE: Creatinine, Ser: 0.7 mg/dL (ref 0.44–1.00)

## 2020-03-17 MED ORDER — IOHEXOL 300 MG/ML  SOLN
100.0000 mL | Freq: Once | INTRAMUSCULAR | Status: AC | PRN
  Administered 2020-03-17: 100 mL via INTRAVENOUS

## 2020-03-18 ENCOUNTER — Encounter: Payer: Self-pay | Admitting: *Deleted

## 2020-03-25 ENCOUNTER — Other Ambulatory Visit: Payer: Self-pay | Admitting: Hematology & Oncology

## 2020-03-25 DIAGNOSIS — M792 Neuralgia and neuritis, unspecified: Secondary | ICD-10-CM

## 2020-03-25 DIAGNOSIS — M545 Low back pain, unspecified: Secondary | ICD-10-CM

## 2020-03-25 DIAGNOSIS — M549 Dorsalgia, unspecified: Secondary | ICD-10-CM

## 2020-03-25 DIAGNOSIS — C541 Malignant neoplasm of endometrium: Secondary | ICD-10-CM

## 2020-03-25 DIAGNOSIS — G8929 Other chronic pain: Secondary | ICD-10-CM

## 2020-03-26 ENCOUNTER — Observation Stay (HOSPITAL_COMMUNITY): Payer: Medicare Other

## 2020-03-26 ENCOUNTER — Emergency Department (HOSPITAL_COMMUNITY): Payer: Medicare Other

## 2020-03-26 ENCOUNTER — Inpatient Hospital Stay (HOSPITAL_COMMUNITY)
Admission: EM | Admit: 2020-03-26 | Discharge: 2020-03-29 | DRG: 065 | Disposition: A | Discharge status: Home Health | Payer: Medicare Other | Attending: Internal Medicine | Admitting: Internal Medicine

## 2020-03-26 ENCOUNTER — Encounter (HOSPITAL_COMMUNITY): Payer: Self-pay | Admitting: Emergency Medicine

## 2020-03-26 ENCOUNTER — Other Ambulatory Visit: Payer: Self-pay

## 2020-03-26 DIAGNOSIS — Z881 Allergy status to other antibiotic agents status: Secondary | ICD-10-CM

## 2020-03-26 DIAGNOSIS — C785 Secondary malignant neoplasm of large intestine and rectum: Secondary | ICD-10-CM | POA: Diagnosis present

## 2020-03-26 DIAGNOSIS — I4891 Unspecified atrial fibrillation: Secondary | ICD-10-CM

## 2020-03-26 DIAGNOSIS — K219 Gastro-esophageal reflux disease without esophagitis: Secondary | ICD-10-CM | POA: Diagnosis present

## 2020-03-26 DIAGNOSIS — Z79899 Other long term (current) drug therapy: Secondary | ICD-10-CM

## 2020-03-26 DIAGNOSIS — I63422 Cerebral infarction due to embolism of left anterior cerebral artery: Secondary | ICD-10-CM

## 2020-03-26 DIAGNOSIS — M1990 Unspecified osteoarthritis, unspecified site: Secondary | ICD-10-CM | POA: Diagnosis not present

## 2020-03-26 DIAGNOSIS — G459 Transient cerebral ischemic attack, unspecified: Secondary | ICD-10-CM

## 2020-03-26 DIAGNOSIS — I2510 Atherosclerotic heart disease of native coronary artery without angina pectoris: Secondary | ICD-10-CM | POA: Diagnosis not present

## 2020-03-26 DIAGNOSIS — I272 Pulmonary hypertension, unspecified: Secondary | ICD-10-CM | POA: Diagnosis not present

## 2020-03-26 DIAGNOSIS — I63512 Cerebral infarction due to unspecified occlusion or stenosis of left middle cerebral artery: Secondary | ICD-10-CM | POA: Diagnosis not present

## 2020-03-26 DIAGNOSIS — Z8249 Family history of ischemic heart disease and other diseases of the circulatory system: Secondary | ICD-10-CM

## 2020-03-26 DIAGNOSIS — Z823 Family history of stroke: Secondary | ICD-10-CM

## 2020-03-26 DIAGNOSIS — E039 Hypothyroidism, unspecified: Secondary | ICD-10-CM | POA: Diagnosis present

## 2020-03-26 DIAGNOSIS — R519 Headache, unspecified: Secondary | ICD-10-CM | POA: Diagnosis present

## 2020-03-26 DIAGNOSIS — I10 Essential (primary) hypertension: Secondary | ICD-10-CM | POA: Diagnosis not present

## 2020-03-26 DIAGNOSIS — Z951 Presence of aortocoronary bypass graft: Secondary | ICD-10-CM | POA: Diagnosis not present

## 2020-03-26 DIAGNOSIS — Z8542 Personal history of malignant neoplasm of other parts of uterus: Secondary | ICD-10-CM

## 2020-03-26 DIAGNOSIS — Z933 Colostomy status: Secondary | ICD-10-CM | POA: Diagnosis not present

## 2020-03-26 DIAGNOSIS — E785 Hyperlipidemia, unspecified: Secondary | ICD-10-CM | POA: Diagnosis not present

## 2020-03-26 DIAGNOSIS — Z79890 Hormone replacement therapy (postmenopausal): Secondary | ICD-10-CM | POA: Diagnosis not present

## 2020-03-26 DIAGNOSIS — Z8673 Personal history of transient ischemic attack (TIA), and cerebral infarction without residual deficits: Secondary | ICD-10-CM | POA: Diagnosis not present

## 2020-03-26 DIAGNOSIS — I639 Cerebral infarction, unspecified: Secondary | ICD-10-CM

## 2020-03-26 DIAGNOSIS — Z888 Allergy status to other drugs, medicaments and biological substances status: Secondary | ICD-10-CM

## 2020-03-26 DIAGNOSIS — I482 Chronic atrial fibrillation, unspecified: Secondary | ICD-10-CM | POA: Diagnosis not present

## 2020-03-26 DIAGNOSIS — R4701 Aphasia: Secondary | ICD-10-CM | POA: Diagnosis not present

## 2020-03-26 DIAGNOSIS — Z841 Family history of disorders of kidney and ureter: Secondary | ICD-10-CM

## 2020-03-26 DIAGNOSIS — Z20822 Contact with and (suspected) exposure to covid-19: Secondary | ICD-10-CM | POA: Diagnosis present

## 2020-03-26 DIAGNOSIS — I63412 Cerebral infarction due to embolism of left middle cerebral artery: Secondary | ICD-10-CM

## 2020-03-26 DIAGNOSIS — R29701 NIHSS score 1: Secondary | ICD-10-CM | POA: Diagnosis not present

## 2020-03-26 DIAGNOSIS — I350 Nonrheumatic aortic (valve) stenosis: Secondary | ICD-10-CM | POA: Diagnosis present

## 2020-03-26 DIAGNOSIS — K5900 Constipation, unspecified: Secondary | ICD-10-CM | POA: Diagnosis not present

## 2020-03-26 HISTORY — DX: Cerebral infarction, unspecified: I63.9

## 2020-03-26 HISTORY — DX: Unspecified atrial fibrillation: I48.91

## 2020-03-26 LAB — CBC
HCT: 40.2 % (ref 36.0–46.0)
Hemoglobin: 13.5 g/dL (ref 12.0–15.0)
MCH: 30.4 pg (ref 26.0–34.0)
MCHC: 33.6 g/dL (ref 30.0–36.0)
MCV: 90.5 fL (ref 80.0–100.0)
Platelets: 300 10*3/uL (ref 150–400)
RBC: 4.44 MIL/uL (ref 3.87–5.11)
RDW: 13.4 % (ref 11.5–15.5)
WBC: 5.9 10*3/uL (ref 4.0–10.5)
nRBC: 0 % (ref 0.0–0.2)

## 2020-03-26 LAB — RESPIRATORY PANEL BY RT PCR (FLU A&B, COVID)
Influenza A by PCR: NEGATIVE
Influenza B by PCR: NEGATIVE
SARS Coronavirus 2 by RT PCR: NEGATIVE

## 2020-03-26 LAB — DIFFERENTIAL
Abs Immature Granulocytes: 0.03 10*3/uL (ref 0.00–0.07)
Basophils Absolute: 0 10*3/uL (ref 0.0–0.1)
Basophils Relative: 0 %
Eosinophils Absolute: 0 10*3/uL (ref 0.0–0.5)
Eosinophils Relative: 0 %
Immature Granulocytes: 1 %
Lymphocytes Relative: 10 %
Lymphs Abs: 0.6 10*3/uL — ABNORMAL LOW (ref 0.7–4.0)
Monocytes Absolute: 0.2 10*3/uL (ref 0.1–1.0)
Monocytes Relative: 4 %
Neutro Abs: 5 10*3/uL (ref 1.7–7.7)
Neutrophils Relative %: 85 %

## 2020-03-26 LAB — CBG MONITORING, ED
Glucose-Capillary: 116 mg/dL — ABNORMAL HIGH (ref 70–99)
Glucose-Capillary: 104 mg/dL — ABNORMAL HIGH (ref 70–99)

## 2020-03-26 LAB — PROTIME-INR
INR: 1 (ref 0.8–1.2)
Prothrombin Time: 12.7 seconds (ref 11.4–15.2)

## 2020-03-26 LAB — COMPREHENSIVE METABOLIC PANEL
ALT: 11 U/L (ref 0–44)
AST: 17 U/L (ref 15–41)
Albumin: 4.5 g/dL (ref 3.5–5.0)
Alkaline Phosphatase: 73 U/L (ref 38–126)
Anion gap: 10 (ref 5–15)
BUN: 15 mg/dL (ref 8–23)
CO2: 27 mmol/L (ref 22–32)
Calcium: 9.8 mg/dL (ref 8.9–10.3)
Chloride: 104 mmol/L (ref 98–111)
Creatinine, Ser: 0.67 mg/dL (ref 0.44–1.00)
GFR, Estimated: >60 mL/min (ref >60)
Glucose, Bld: 119 mg/dL — ABNORMAL HIGH (ref 70–99)
Potassium: 3.8 mmol/L (ref 3.5–5.1)
Sodium: 141 mmol/L (ref 135–145)
Total Bilirubin: 0.7 mg/dL (ref 0.3–1.2)
Total Protein: 8.1 g/dL (ref 6.5–8.1)

## 2020-03-26 LAB — I-STAT CHEM 8, ED
BUN: 14 mg/dL (ref 8–23)
Calcium, Ion: 1.26 mmol/L (ref 1.15–1.40)
Chloride: 103 mmol/L (ref 98–111)
Creatinine, Ser: 0.7 mg/dL (ref 0.44–1.00)
Glucose, Bld: 116 mg/dL — ABNORMAL HIGH (ref 70–99)
HCT: 41 % (ref 36.0–46.0)
Hemoglobin: 13.9 g/dL (ref 12.0–15.0)
Potassium: 3.9 mmol/L (ref 3.5–5.1)
Sodium: 139 mmol/L (ref 135–145)
TCO2: 27 mmol/L (ref 22–32)

## 2020-03-26 LAB — APTT: aPTT: 34 seconds (ref 24–36)

## 2020-03-26 MED ORDER — LEVOTHYROXINE SODIUM 50 MCG PO TABS
50.0000 ug | ORAL_TABLET | Freq: Every day | ORAL | Status: DC
  Administered 2020-03-28 – 2020-03-29 (×2): 50 ug via ORAL
  Filled 2020-03-26 (×2): qty 1

## 2020-03-26 MED ORDER — ACETAMINOPHEN 160 MG/5ML PO SOLN: 650.0000 mg | ORAL | Status: DC | PRN

## 2020-03-26 MED ORDER — ONDANSETRON HCL 4 MG/2ML IJ SOLN
4.0000 mg | Freq: Three times a day (TID) | INTRAMUSCULAR | Status: DC | PRN
  Administered 2020-03-26: 4 mg via INTRAVENOUS
  Filled 2020-03-26: qty 2

## 2020-03-26 MED ORDER — PANCRELIPASE (LIP-PROT-AMYL) 36000-114000 UNITS PO CPEP
72000.0000 [IU] | ORAL_CAPSULE | Freq: Three times a day (TID) | ORAL | Status: DC
  Administered 2020-03-27 – 2020-03-28 (×5): 72000 [IU] via ORAL
  Filled 2020-03-26 (×8): qty 2

## 2020-03-26 MED ORDER — ACETAMINOPHEN 650 MG RE SUPP
650.0000 mg | RECTAL | Status: DC | PRN
  Filled 2020-03-26 (×2): qty 1

## 2020-03-26 MED ORDER — DILTIAZEM HCL ER COATED BEADS 300 MG PO CP24
300.0000 mg | ORAL_CAPSULE | Freq: Every day | ORAL | Status: DC
  Administered 2020-03-27 – 2020-03-29 (×3): 300 mg via ORAL
  Filled 2020-03-26 (×3): qty 1

## 2020-03-26 MED ORDER — IOHEXOL 350 MG/ML SOLN
100.0000 mL | Freq: Once | INTRAVENOUS | Status: AC | PRN
  Administered 2020-03-26: 100 mL via INTRAVENOUS

## 2020-03-26 MED ORDER — ASPIRIN 81 MG PO CHEW
81.0000 mg | CHEWABLE_TABLET | Freq: Once | ORAL | Status: AC
  Administered 2020-03-26: 81 mg via ORAL
  Filled 2020-03-26: qty 1

## 2020-03-26 MED ORDER — ACETAMINOPHEN 325 MG PO TABS
650.0000 mg | ORAL_TABLET | ORAL | Status: DC | PRN
  Administered 2020-03-27 – 2020-03-29 (×5): 650 mg via ORAL
  Filled 2020-03-26 (×7): qty 2

## 2020-03-26 MED ORDER — ENOXAPARIN SODIUM 40 MG/0.4ML ~~LOC~~ SOLN
40.0000 mg | SUBCUTANEOUS | Status: DC
  Administered 2020-03-26: 40 mg via SUBCUTANEOUS
  Filled 2020-03-26: qty 0.4

## 2020-03-26 MED ORDER — STROKE: EARLY STAGES OF RECOVERY BOOK
Freq: Once | Status: AC
  Filled 2020-03-26 (×2): qty 1

## 2020-03-26 MED ORDER — SODIUM CHLORIDE 0.9% FLUSH
3.0000 mL | Freq: Once | INTRAVENOUS | Status: AC
  Administered 2020-03-26: 3 mL via INTRAVENOUS

## 2020-03-26 MED ORDER — LACTATED RINGERS IV SOLN: INTRAVENOUS | Status: AC

## 2020-03-26 MED ORDER — LORAZEPAM 2 MG/ML IJ SOLN
0.5000 mg | Freq: Once | INTRAMUSCULAR | Status: AC
  Administered 2020-03-26: 0.5 mg via INTRAVENOUS
  Filled 2020-03-26: qty 1

## 2020-03-26 NOTE — Progress Notes (Deleted)
[facility] TELEMEDICINE  CONSULT   Date of service: March 26, 2020 Patient Name: Shannon Scott MRN:  [id number] DOB:  [birth year] Requesting Provider: Tommi Rumps Location of the provider: [facility] Neurohospitalist office Location of the patient: [facility] Reason for consult: "Stroke code"   This consult was provided via telemedicine with 2-way video and audio communication. The patient/family was informed that care would be provided in this way and agreed to receive care in this manner.    History of Present Illness  Shannon Scott is a 79 y.o. female with PMH significant for asthma, CAD, HLD, HTN, Uterina Ca locally advanced who presents with concern for expressive aphasia, R sided headache and neck pain. She was noted to have word finding difficulty. Initial LKW was reported to be 2100 on 03/25/20. Patient improved in the ED and was able to provide history herself. Reports that she woke up at [place] and was fine in the morning. She had word finding difficulty and confusion with headache that started at 0900 on 03/26/20. Reports R sided headache and feeling confused.  Vitals with SBP elevated to 198/110, glucose of 116, sats normal, normal creatinine on labs.  On tele evaluation, patient had a NIHSS of 0.  Stroke Measures   Last Known Well: 0900 on 03/26/20. TPA Given: no, NIHSS of 0 IR Thrombectomy: No, NIHSS of 0 mRS: Modified Rankin Scale: 1-No significant post stroke disability and can perform usual duties with stroke symptoms Time of teleneurologist evaluation: 11:40   Vitals   Vitals:   03/26/20 1140  BP: (!) 198/110  Pulse: 85  Resp: (!) 21  SpO2: 100%     There is no height or weight on file to calculate BMI.   Tele-neuro Exam and NIHSS   General: Awake, alert, hard of hearing.  1A: Level of Consciousness - 0 1B: Ask Month and Age - 0 1C: 'Blink Eyes' & 'Squeeze Hands' - 0 2: Test Horizontal Extraocular Movements - 0 3: Test Visual  Fields - 0 4: Test Facial Palsy - 0 5A: Test Left Arm Motor Drift - 0 5B: Test Right Arm Motor Drift - 0 6A: Test Left Leg Motor Drift - 0 6B: Test Right Leg Motor Drift - 0 7: Test Limb Ataxia - 0 8: Test Sensation - 0 9: Test Language/Aphasia- 0 10: Test Dysarthria - 0 11: Test Extinction/Inattention - 0 NIHSS score: 0  Other exam findings: None  Imaging and Labs   CBC:  Recent Labs  Lab 03/26/20 1145  HGB 13.9  HCT 99.8    Basic Metabolic Panel:  Lab Results  Component Value Date   NA 139 03/26/2020   K 3.9 03/26/2020   CO2 27 02/05/2020   GLUCOSE 116 (H) 03/26/2020   BUN 14 03/26/2020   CREATININE 0.70 03/26/2020   CALCIUM 9.5 02/05/2020   GFRNONAA >60 02/05/2020   GFRAA >60 02/05/2020   Lipid Panel:  Lab Results  Component Value Date   LDLCALC 257 (H) 08/02/2016   HgbA1c: No results found for: HGBA1C Urine Drug Screen: No results found for: LABOPIA, COCAINSCRNUR, LABBENZ, AMPHETMU, THCU, LABBARB  Alcohol Level No results found for: [facility]   Impression   Shannon Scott is a 79 y.o. female with PMH significant for asthma, CAD, HLD, HTN, Uterina Ca locally advanced who presents with concern for expressive aphasia, R sided headache and neck pain.Marland Kitchen Her limited tele-neurologic examination is notable for no focal deficit, intact cognition. Her SBP as elevated to 338 systolic on  presentatin, normal glucose.  Her presentation could be concerning for PRES or a TIA/minor ischemic stroke. If MRI Brain shows PRES, she would need tighter blood pressure control. Recommendations as below.  Recommendations  - Stroke code cancelled due to resolution of symptoms. - Recommend CTH without contrast, if negative for Intracranial Hemorrhage, recommend giving Aspirin 78m right now. - MRI Brain without contrast  TIA workup as below: - Recommend Vascular imaging with MRA Angio Head without contrast and [place]Carotid doppler - TTE about 5 months ago with EF of 55-60%, grade 2  diastolic dysfunction. No need to repeat it. - Recommend obtaining Lipid panel with LDL - Please start statin if LDL > 70 - Recommend HbA1c - Antithrombotic - Aspirin 881mdaily if CTH is negative for an ICH. - Recommend DVT ppx - SBP goal - permissive hypertension first 24 h < 220/110. If however, the MRI is concerning for PRES, recommend gradual normotension. - Recommend Telemetry monitoring for arrythmia - Stroke education booklet - Recommend PT/OT/SLP consult ______________________________________________________________________  This patient is receiving care for possible acute neurological changes. There was 35 minutes of care by this provider at the time of service, including time for direct evaluation via telemedicine, review of medical records, imaging studies and discussion of findings with providers, the patient and/or family.  [facility] Pager Number [phone] 7pm- 7am, please page neurology on call as listed in [place]

## 2020-03-26 NOTE — ED Notes (Signed)
Patient transported to MRI 

## 2020-03-26 NOTE — ED Notes (Signed)
Patient back from CT.

## 2020-03-26 NOTE — ED Notes (Signed)
Pt also c/o headache and neck pain.

## 2020-03-26 NOTE — ED Notes (Signed)
Patient back from MRI.

## 2020-03-26 NOTE — ED Provider Notes (Signed)
[facility] DEPT Provider Note   CSN: [id number] Arrival date & time: 03/26/20  1124     History Chief Complaint  Patient presents with  . Speech Problem  . Headache    Shannon Scott is a 79 y.o. female.  The history is provided by the patient and medical records. No language interpreter was used.  Neurologic Problem This is a new problem. The current episode started 3 to 5 hours ago. The problem occurs constantly. The problem has been rapidly improving. Associated symptoms include headaches. Pertinent negatives include no chest pain, no abdominal pain and no shortness of breath. Nothing aggravates the symptoms. Nothing relieves the symptoms. She has tried nothing for the symptoms. The treatment provided no relief.       Past Medical History:  Diagnosis Date  . Arthritis   . Asthma    allergy induced  . Bilateral carotid artery disease (HCC)    L carotid bruit  . Bursitis    left hip  . CAD (coronary artery disease)   . Cancer ([place])   . Dyslipidemia    intolerant to statins, welchol, niacin, zetia  . Goals of care, counseling/discussion 10/11/2017  . History of blood transfusion   . History of nuclear stress test 04/24/2012   lexiscan; normal study  . Hypertension   . Hypothyroidism   . 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 ([place]) 10/11/2017  . Postoperative nausea and vomiting 01/02/2016    Patient Active Problem List   Diagnosis Date Noted  . Acute vaginitis 01/03/2020  . Acute vulvitis 01/03/2020  . Lower extremity edema 11/09/2018  . Back pain 11/09/2018  . IDA (iron deficiency anemia) 10/21/2017  . Abdominal mass   . Deep vein thrombosis (DVT) of left lower extremity ([place])   . Pelvic mass   . Acute blood loss anemia   . 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 ([place]) 10/11/2017  . Goals of care, counseling/discussion 10/11/2017  . Rectal  bleeding 10/03/2017  . Normocytic anemia 10/03/2017  . Left hip pain 10/03/2017  . Generalized weakness 10/03/2017  . Colostomy status ([place]) 08/30/2017  . Ureteral stent displacement, subsequent encounter 08/30/2017  . Atrial fibrillation with RVR ([place]) 08/30/2017  . Hypophosphatemia 08/30/2017  . Hypomagnesemia 08/30/2017  . Acute blood loss as cause of postoperative anemia 08/30/2017  . Chronic anemia 08/30/2017  . Left leg swelling 08/30/2017  . GERD (gastroesophageal reflux disease) 08/30/2017  . New onset atrial fibrillation ([place])   . Bowel perforation ([place]) 08/18/2017  . Colonic mass 08/18/2017  . Ureteral dilatation 08/18/2017  . Intractable right heel pain 10/07/2016  . Plantar fasciitis of right foot 10/07/2016  . Postoperative nausea and vomiting 01/02/2016  . Hyponatremia 01/01/2016  . Acute hypokalemia 01/01/2016  . Endometrial cancer ([place]) 12/30/2015  . Familial hypercholesteremia 07/28/2015  . Aortic valve stenosis 08/02/2014  . S/P CABG x 3 01/13/2013  . Palpitations 01/13/2013  . Medication intolerance 01/13/2013  . Chronic pain 01/13/2013  . TIA (transient ischemic attack) 01/13/2013  . Carotid stenosis 01/13/2013  . Hypothyroid 10/05/2010  . Essential hypertension 10/05/2010  . Gastroesophagitis 10/05/2010  . Atrophic gastritis 10/05/2010    Past Surgical History:  Procedure Laterality Date  . ABDOMINAL HYSTERECTOMY    . Carotid Doppler  02/2012   40-59% right int carotid artery stenosis; 60-79% L int carotid stenosis; L carotid bruit  . CORONARY ARTERY BYPASS GRAFT  03/12/2004   LIMA to LAD, SVG to  circumflex, SVG to PDA  . CYSTOSCOPY W/ URETERAL STENT PLACEMENT Left 12/06/2017   Procedure: CYSTOSCOPY WITH LEFT URETERAL STENT EXCHANGE;  Surgeon: Irine Seal, MD;  Location: [facility];  Service: Urology;  Laterality: Left;  . CYSTOSCOPY WITH STENT PLACEMENT Bilateral 08/21/2017   Procedure: CYSTOSCOPY WITH STENT PLACEMENT;  Surgeon: Ceasar Mons,  MD;  Location: [facility];  Service: Urology;  Laterality: Bilateral;  . IR FLUORO GUIDE PORT INSERTION RIGHT  10/11/2017  . IR GENERIC HISTORICAL  04/29/2016   IR RADIOLOGIST EVAL & MGMT 04/29/2016 Sandi Mariscal, MD [facility] INTERV RAD  . IR GENERIC HISTORICAL  05/12/2016   IR RADIOLOGIST EVAL & MGMT 05/12/2016 Sandi Mariscal, MD [facility] INTERV RAD  . IR IVC FILTER PLMT / S&I /IMG GUID/MOD SED  10/11/2017  . IR US GUIDE VASC ACCESS RIGHT  10/11/2017  . ROBOTIC ASSISTED TOTAL HYSTERECTOMY WITH BILATERAL SALPINGO OOPHERECTOMY Bilateral 12/30/2015   Procedure: XI ROBOTIC ASSISTED TOTAL HYSTERECTOMY WITH BILATERAL SALPINGO OOPHORECTOMY WITH SENTAL LYMPH NODE BIOPSY;  Surgeon: Nancy Marus, MD;  Location: [facility];  Service: Gynecology;  Laterality: Bilateral;  . TONSILLECTOMY    . TRANSTHORACIC ECHOCARDIOGRAM  04/2007   EF>55%; mild MR; mild-mod TR; mild pulm HTN; mild calcification of aortiv valve leaflets with mild valvular aortic stenosis  . TUBAL LIGATION       OB History   No obstetric history on file.     Family History  Problem Relation Age of Onset  . Hypertension Mother   . Heart disease Mother        Died in her 72s  . Stroke Father   . Kidney disease Brother   . Heart disease Brother        also HTN, hyperlipidemia  . Heart attack Brother   . Stroke Sister        x2  . Hypertension Sister     Social History   Tobacco Use  . Smoking status: Never Smoker  . Smokeless tobacco: Never Used  Vaping Use  . Vaping Use: Never used  Substance Use Topics  . Alcohol use: No  . Drug use: No    Home Medications Prior to Admission medications   Medication Sig Start Date End Date Taking? Authorizing Provider  amoxicillin (AMOXIL) 500 MG tablet Take four tablets prior to dental procedure Patient not taking: Reported on 02/05/2020 02/03/18   Volanda Napoleon, MD  diclofenac sodium (VOLTAREN) 1 % GEL Apply 2 g topically 4 (four) times daily.    [provider]  diltiazem (TIAZAC) 300 MG 24  hr capsule TAKE 1 CAPSULE(300 MG) BY MOUTH DAILY Patient taking differently: Take 300 mg by mouth daily.  06/13/18   Volanda Napoleon, MD  gabapentin (NEURONTIN) 100 MG capsule Take 1 capsule (100 mg total) by mouth 2 (two) times daily. Patient not taking: Reported on 02/05/2020 11/10/18   [place], Holli Humbles, NP  ibuprofen (ADVIL,MOTRIN) 200 MG tablet Take 200 mg by mouth daily as needed (back pain).    [provider]  lactose free nutrition (BOOST PLUS) LIQD Take 237 mLs by mouth 3 (three) times daily with meals. 10/16/17   Eugenie Filler, MD  levothyroxine (SYNTHROID, LEVOTHROID) 50 MCG tablet Take 1 tablet (50 mcg total) by mouth daily before breakfast. 10/25/17   [place], Lyndon Code., PA-C  lidocaine-prilocaine (EMLA) cream Apply 1 application topically as needed. Patient not taking: Reported on 02/05/2020 10/20/17   Volanda Napoleon, MD  lipase/protease/amylase (CREON) 36000 UNITS CPEP capsule Take 2  capsules (72,000 Units total) by mouth 3 (three) times daily before meals. Patient not taking: Reported on 02/05/2020 12/16/17   Volanda Napoleon, MD  nystatin cream (MYCOSTATIN) Apply 1 application topically 2 (two) times daily. Patient not taking: Reported on 02/05/2020 06/08/19   [place], Holli Humbles, NP  ondansetron (ZOFRAN) 8 MG tablet TAKE 1 TABLET(8 MG) BY MOUTH EVERY 8 HOURS AS NEEDED FOR NAUSEA OR VOMITING Patient not taking: Reported on 02/05/2020 08/16/18   Volanda Napoleon, MD  pantoprazole (PROTONIX) 40 MG tablet Take 1 tablet (40 mg total) by mouth daily. Patient not taking: Reported on 02/05/2020 08/27/17   Raiford Noble Latif, DO  polyethylene glycol [facility] / GLYCOLAX) packet Take 17 g by mouth daily. Patient not taking: Reported on 02/05/2020 10/16/17   Eugenie Filler, MD  prochlorperazine (COMPAZINE) 10 MG tablet Take 1 tablet (10 mg total) by mouth every 6 (six) hours as needed for nausea or vomiting. Patient not taking: Reported on 02/05/2020 10/20/17   Volanda Napoleon, MD    senna-docusate (SENOKOT-S) 8.6-50 MG tablet Take 1 tablet by mouth 2 (two) times daily. Patient not taking: Reported on 02/05/2020 10/16/17   Eugenie Filler, MD  traMADol (ULTRAM) 50 MG tablet TAKE 2 TABLETS(100 MG) BY MOUTH TWICE DAILY 03/25/20   Volanda Napoleon, MD  triamcinolone ointment (KENALOG) 0.5 % Apply topically daily. Use for 2 weeks Patient not taking: Reported on 02/05/2020 01/03/20   Lafonda Mosses, MD    Allergies    Statins and Ciprofloxacin  Review of Systems   Review of Systems  Constitutional: Negative for chills, diaphoresis, fatigue and fever.  HENT: Negative for congestion.   Eyes: Negative for photophobia and visual disturbance.  Respiratory: Negative for cough, chest tightness, shortness of breath and wheezing.   Cardiovascular: Negative for chest pain.  Gastrointestinal: Negative for abdominal pain, constipation, diarrhea, nausea and vomiting.  Genitourinary: Negative for dysuria and flank pain.  Musculoskeletal: Positive for neck pain. Negative for back pain.  Neurological: Positive for speech difficulty and headaches. Negative for dizziness, facial asymmetry, weakness, light-headedness and numbness.  Psychiatric/Behavioral: Negative for agitation and confusion.  All other systems reviewed and are negative.   Physical Exam Updated Vital Signs BP (!) 167/64   Pulse 85   Resp 18   Ht 5' 1"  (1.549 m)   Wt 66 kg   SpO2 98%   BMI 27.49 kg/m   Physical Exam Vitals and nursing note reviewed.  Constitutional:      General: She is not in acute distress.    Appearance: She is well-developed. She is not ill-appearing, toxic-appearing or diaphoretic.  HENT:     Head: Normocephalic and atraumatic.     Mouth/Throat:     Mouth: Mucous membranes are moist.     Pharynx: Oropharynx is clear.  Eyes:     General: No visual field deficit.    Extraocular Movements: Extraocular movements intact.     Conjunctiva/sclera: Conjunctivae normal.     Pupils: Pupils  are equal, round, and reactive to light.  Cardiovascular:     Rate and Rhythm: Normal rate and regular rhythm.     Heart sounds: No murmur heard.   Pulmonary:     Effort: Pulmonary effort is normal. No respiratory distress.     Breath sounds: Normal breath sounds. No wheezing, rhonchi or rales.  Chest:     Chest wall: No tenderness.  Abdominal:     Palpations: Abdomen is soft.     Tenderness: There  is no abdominal tenderness.  Musculoskeletal:        General: No swelling or tenderness. Normal range of motion.     Cervical back: Neck supple.  Skin:    General: Skin is warm and dry.     Capillary Refill: Capillary refill takes less than 2 seconds.  Neurological:     Mental Status: She is alert.     GCS: GCS eye subscore is 4. GCS verbal subscore is 5. GCS motor subscore is 6.     Cranial Nerves: No cranial nerve deficit, dysarthria or facial asymmetry.     Sensory: No sensory deficit.     Motor: No weakness, abnormal muscle tone or seizure activity.     Coordination: Coordination normal. Finger-Nose-Finger Test normal.     Comments: Patient has aphasia on arrival.  No other focal neurologic deficits on my initial exam.  Psychiatric:        Mood and Affect: Mood normal. Mood is not anxious.     ED Results / Procedures / Treatments   Labs (all labs ordered are listed, but only abnormal results are displayed) Labs Reviewed  DIFFERENTIAL - Abnormal; Notable for the following components:      Result Value   Lymphs Abs 0.6 (*)    All other components within normal limits  COMPREHENSIVE METABOLIC PANEL - Abnormal; Notable for the following components:   Glucose, Bld 119 (*)    All other components within normal limits  I-STAT CHEM 8, ED - Abnormal; Notable for the following components:   Glucose, Bld 116 (*)    All other components within normal limits  CBG MONITORING, ED - Abnormal; Notable for the following components:   Glucose-Capillary 116 (*)    All other components  within normal limits  RESPIRATORY PANEL BY RT PCR (FLU A&B, COVID)  PROTIME-INR  APTT  CBC    EKG EKG Interpretation  Date/Time:  Wednesday March 26 2020 11:51:11 EDT Ventricular Rate:  81 PR Interval:    QRS Duration: 95 QT Interval:  390 QTC Calculation: 453 R Axis:   37 Text Interpretation: Sinus rhythm abnormal R-wave progression, early transition Nonspecific T abnormalities, lateral leads When compared to prior, similar appearnce. No STEMI Confirmed by Antony Blackbird [phone]) on 03/26/2020 12:29:42 PM   Radiology CT Head Wo Contrast  Result Date: 03/26/2020 CLINICAL DATA:  Neuro deficit, acute, stroke suspected; aphasia, headache, right neck pain. EXAM: CT HEAD WITHOUT CONTRAST TECHNIQUE: Contiguous axial images were obtained from the base of the skull through the vertex without intravenous contrast. COMPARISON:  Prior head CT 10/06/2010. FINDINGS: Brain: Mild generalized cerebral atrophy. Age-indeterminate lacunar infarct within the right basal ganglia (series 2, image 12) (series 5, image 32). Redemonstrated prominent perivascular space versus chronic lacunar infarct within the right subinsular white matter (series 2, image 11). There is no acute intracranial hemorrhage. No demarcated cortical infarct. No extra-axial fluid collection. No evidence of intracranial mass. No midline shift. Vascular: No hyperdense vessel.  Atherosclerotic calcifications. Skull: Normal. Negative for fracture or focal lesion. Sinuses/Orbits: Visualized orbits show no acute finding. No significant paranasal sinus disease or mastoid effusion at the imaged levels IMPRESSION: No evidence of acute intracranial hemorrhage or acute demarcated cortical infarction. Right basal ganglia lacunar infarct, new as compared to the head CT of 10/06/2010, but otherwise age indeterminate. Redemonstrated prominent perivascular space versus chronic lacunar infarct within the right subinsular white matter. Mild cerebral atrophy.  Electronically Signed   By: Kellie Simmering DO   On: 03/26/2020 12:19  MR BRAIN WO CONTRAST  Result Date: 03/26/2020 CLINICAL DATA:  Neuro deficit, acute stroke suspected. EXAM: MRI HEAD WITHOUT CONTRAST TECHNIQUE: Multiplanar, multiecho pulse sequences of the brain and surrounding structures were obtained without intravenous contrast. COMPARISON:  Same day head CT. FINDINGS: Brain: There is restricted diffusion involving the inferior left parietal lobe and posterior aspect of the insula. There is mild associated T2/FLAIR hyperintensity without substantial mass effect. Additional scattered T2/FLAIR hyperintensities in the white matter, compatible with chronic microvascular ischemic disease. Mild cerebral volume loss. No hydrocephalus. No acute hemorrhage. No mass lesion or abnormal mass effect. Remote lacunar infarcts in the right basal ganglia. Vascular: Major arterial flow voids are maintained at the skull base. Skull and upper cervical spine: Normal marrow signal. Sinuses/Orbits: Sinuses are clear.  Unremarkable orbits. Other: No mastoid effusions. IMPRESSION: 1. Acute infarct in the inferior left parietal lobe and posterior insula. Mild edema without substantial mass effect. 2. Chronic microvascular ischemic disease and remote lacunar infarcts in the right basal ganglia. Electronically Signed   By: Margaretha Sheffield MD   On: 03/26/2020 13:27    Procedures Procedures (including critical care time)  CRITICAL CARE Performed by: Gwenyth Allegra Jayse Hodkinson Total critical care time: 35 minutes Critical care time was exclusive of separately billable procedures and treating other patients. Critical care was necessary to treat or prevent imminent or life-threatening deterioration. Critical care was time spent personally by me on the following activities: development of treatment plan with patient and/or surrogate as well as nursing, discussions with consultants, evaluation of patient's response to treatment,  examination of patient, obtaining history from patient or surrogate, ordering and performing treatments and interventions, ordering and review of laboratory studies, ordering and review of radiographic studies, pulse oximetry and re-evaluation of patient's condition.   Medications Ordered in ED Medications  ondansetron (ZOFRAN) injection 4 mg (4 mg Intravenous Given 03/26/20 1536)  sodium chloride flush (NS) 0.9 % injection 3 mL (3 mLs Intravenous Given 03/26/20 1203)  LORazepam (ATIVAN) injection 0.5 mg (0.5 mg Intravenous Given 03/26/20 1224)  aspirin chewable tablet 81 mg (81 mg Oral Given 03/26/20 1327)  iohexol (OMNIPAQUE) 350 MG/ML injection 100 mL (100 mLs Intravenous Contrast Given 03/26/20 1510)    ED Course  I have reviewed the triage vital signs and the nursing notes.  Pertinent labs & imaging results that were available during my care of the patient were reviewed by me and considered in my medical decision making (see chart for details).    MDM Rules/Calculators/A&P                          MIDA CORY is a 79 y.o. female with a past medical history significant for CAD status post CABG, hypertension, carotid disease, prior TIA, history of A. Fib, prior bowel surgery with ostomy, prior DVT, and hypothyroidism who presents as a code stroke.  According to family and patient, she has had difficulty with speech this morning with aphasia.  Patient is unsure if she was speaking normally this morning or when she went to bed last night.  Family says he last saw her at 9 PM last night and she was speaking clearly with no neurologic complaints.  This morning, patient think she got up at 830 and was speaking normally until 9:00 when her symptoms began.  Patient on arrival had aphasia and was unable to answer all the questions and kept repeating herself.  She denied any numbness, tingling, weakness of extremities.  90 double vision or blurry vision.  She did have a right-sided headache and  pain in her neck and her blood pressure was very elevated at around 595 systolic.  On my initial evaluation, the code stroke neurology team was on the robot evaluating patient as well.  Patient was describing elevated blood pressures, pain in her right neck and head, and the speech difficulty that does appear to be improving throughout her ED stay so far.  Initially, I ordered a CTA head and neck to make sure there was not some vertebral artery dissection, or aneurysmal bleed however the neurology team preferred a CT head without contrast followed by MRI.  Will make the order changes.  Glucose was normal.  On my exam, patient did have expressive aphasia and did not have other focal neurologic deficits.  She had normal sensation and strength in extremities.  No facial droop.  Normal sensation face.  Pupils are symmetric reactive normal extraocular movements.  No neck tenderness on my exam.  I did not perform finger-nose-finger testing initially.  We will defer management to neurology team in regards to imaging and blood pressure management at this time.  Anticipate reassessment after work-up to determine disposition.  12:39 PM Noncontrasted CT head returned without any evidence of acute hemorrhage.  If this was the case, neurology recommended you 1 mg of aspirin in the emergency department and collecting MRI to look for stroke.  1:44 PM CT scan shows acute stroke.  Neurology recommends admission to [facility] service so they can see her as an inpatient by the neurology team for further stroke work-up.  Neurology recommended keeping her blood pressure below 638 systolic for permissive hypertension with a stroke.  4:16 PM Patient had a second code stroke called on her while in the emergency department.  Her speech started to worsen again.  She ended up having CTA of the head and neck.  Neurology now wants her to go ED to ED to be assessed by them in person in the [facility] emergency  department.  She is still going to be getting admitted to the hospital service over there.  I spoke to Dr. Shirlyn Goltz who accept the patient for ED to ED transfer.  She will be admitted    Final Clinical Impression(s) / ED Diagnoses Final diagnoses:  Cerebrovascular accident (CVA), unspecified mechanism ([place])  Aphasia     Clinical Impression: 1. Cerebrovascular accident (CVA), unspecified mechanism ([place])   2. Aphasia     Disposition: Admit  This note was prepared with assistance of Dragon voice recognition software. Occasional wrong-word or sound-a-like substitutions may have occurred due to the inherent limitations of voice recognition software.     Bernarda Erck, Gwenyth Allegra, MD 03/26/20 [phone]

## 2020-03-26 NOTE — ED Notes (Signed)
Code stroke reactivated.

## 2020-03-26 NOTE — ED Notes (Signed)
Patient transported to CT 

## 2020-03-26 NOTE — H&P (Signed)
History and Physical        Hospital Admission Note Date: 03/26/2020  Patient name: Shannon Scott Medical record number: [id number] Date of birth: 1941-01-01 Age: 79 y.o. Gender: female  PCP: Leeroy Cha, MD  Patient coming from: Home Lives with: Alone At baseline, ambulates: Geneticist, molecular Complaint  Patient presents with  . Speech Problem  . Headache      HPI:   This is a 79 year old female with past medical history of CAD s/p CABG, TIA, uterine cancer with colonic metastasis s/p colostomy, atrial fibrillation, hypertension, hyperlipidemia, hypothyroidism, carotid artery disease who was brought in by her son for 1 hour of speech changes.  Last known normal reported by family was 9 PM last night, patient believes she was speaking normally until 9 AM today.  No numbness, tingling, weakness but did admit to headache and left neck pain upon presentation with elevated BP.  On arrival had aphasia and kept repeating herself.  A code stroke was called in the ED initially and upon teleneurology work-up the patient's symptoms had resolved with NIHSS: 0 and code stroke was canceled.  She underwent vascular imaging per neurology recommendations.  MRI revealed an acute left parietal lobe and posterior insula infarct.  Upon my presentation to the bedside neurology was already evaluating the patient and she seemed to have worsening expressive aphasia and difficulty identifying objects on the stroke aphasia cards compared to this morning's evaluation.  A code stroke was called and the patient underwent a CTA head and neck which showed possible left M3 MCA branch occlusion but no infarct from the MRI earlier likely due to timing.  After discussion with the neurologist, plan was made for the patient to transfer to [facility].  Unfortunately due to no bed availability the patient  was transferred from [facility] ED to [facility] ED.  During the interview, she was unable to provide much history due to her expressive aphasia but the son at bedside confirmed the history as above.  ED Course: Afebrile, hypertensive up to 201/66 on room air.  Labs overall unremarkable.  Imaging as above.  She was given Zofran for nausea and Ativan as well as aspirin 81 mg.  Vitals:   03/26/20 1600 03/26/20 1630  BP: (!) 165/46 (!) 162/93  Pulse: 64 73  Resp: 16 (!) 25  SpO2: 96% 98%     Review of Systems:  Review of Systems  All other systems reviewed and are negative.   Medical/Social/Family History   Past Medical History: Past Medical History:  Diagnosis Date  . Arthritis   . Asthma    allergy induced  . Bilateral carotid artery disease (HCC)    L carotid bruit  . Bursitis    left hip  . CAD (coronary artery disease)   . Cancer ([place])   . Dyslipidemia    intolerant to statins, welchol, niacin, zetia  . Goals of care, counseling/discussion 10/11/2017  . History of blood transfusion   . History of nuclear stress test 04/24/2012   lexiscan; normal study  . Hypertension   . Hypothyroidism   . 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 ([place]) 10/11/2017  . Postoperative nausea  and vomiting 01/02/2016    Past Surgical History:  Procedure Laterality Date  . ABDOMINAL HYSTERECTOMY    . Carotid Doppler  02/2012   40-59% right int carotid artery stenosis; 60-79% L int carotid stenosis; L carotid bruit  . CORONARY ARTERY BYPASS GRAFT  03/12/2004   LIMA to LAD, SVG to circumflex, SVG to PDA  . CYSTOSCOPY W/ URETERAL STENT PLACEMENT Left 12/06/2017   Procedure: CYSTOSCOPY WITH LEFT URETERAL STENT EXCHANGE;  Surgeon: Irine Seal, MD;  Location: [facility];  Service: Urology;  Laterality: Left;  . CYSTOSCOPY WITH STENT PLACEMENT Bilateral 08/21/2017   Procedure: CYSTOSCOPY WITH STENT PLACEMENT;  Surgeon: Ceasar Mons, MD;  Location: [facility];  Service:  Urology;  Laterality: Bilateral;  . IR FLUORO GUIDE PORT INSERTION RIGHT  10/11/2017  . IR GENERIC HISTORICAL  04/29/2016   IR RADIOLOGIST EVAL & MGMT 04/29/2016 Sandi Mariscal, MD [facility] INTERV RAD  . IR GENERIC HISTORICAL  05/12/2016   IR RADIOLOGIST EVAL & MGMT 05/12/2016 Sandi Mariscal, MD [facility] INTERV RAD  . IR IVC FILTER PLMT / S&I /IMG GUID/MOD SED  10/11/2017  . IR US GUIDE VASC ACCESS RIGHT  10/11/2017  . ROBOTIC ASSISTED TOTAL HYSTERECTOMY WITH BILATERAL SALPINGO OOPHERECTOMY Bilateral 12/30/2015   Procedure: XI ROBOTIC ASSISTED TOTAL HYSTERECTOMY WITH BILATERAL SALPINGO OOPHORECTOMY WITH SENTAL LYMPH NODE BIOPSY;  Surgeon: Nancy Marus, MD;  Location: [facility];  Service: Gynecology;  Laterality: Bilateral;  . TONSILLECTOMY    . TRANSTHORACIC ECHOCARDIOGRAM  04/2007   EF>55%; mild MR; mild-mod TR; mild pulm HTN; mild calcification of aortiv valve leaflets with mild valvular aortic stenosis  . TUBAL LIGATION      Medications: Prior to Admission medications   Medication Sig Start Date End Date Taking? Authorizing Provider  diltiazem (TIAZAC) 300 MG 24 hr capsule TAKE 1 CAPSULE(300 MG) BY MOUTH DAILY Patient taking differently: Take 300 mg by mouth daily.  06/13/18  Yes Ennever, Rudell Cobb, MD  ibuprofen (ADVIL,MOTRIN) 200 MG tablet Take 200 mg by mouth daily as needed (back pain).   Yes [provider]  levothyroxine (SYNTHROID, LEVOTHROID) 50 MCG tablet Take 1 tablet (50 mcg total) by mouth daily before breakfast. 10/25/17  Yes Tanner, Lyndon Code., PA-C  traMADol (ULTRAM) 50 MG tablet TAKE 2 TABLETS(100 MG) BY MOUTH TWICE DAILY Patient taking differently: Take 100 mg by mouth 2 (two) times daily.  03/25/20  Yes Ennever, Rudell Cobb, MD  lipase/protease/amylase (CREON) 36000 UNITS CPEP capsule Take 2 capsules (72,000 Units total) by mouth 3 (three) times daily before meals. Patient not taking: Reported on 02/05/2020 12/16/17   Volanda Napoleon, MD  ondansetron (ZOFRAN) 8 MG tablet TAKE 1 TABLET(8 MG) BY  MOUTH EVERY 8 HOURS AS NEEDED FOR NAUSEA OR VOMITING Patient not taking: Reported on 02/05/2020 08/16/18   Volanda Napoleon, MD  triamcinolone ointment (KENALOG) 0.5 % Apply topically daily. Use for 2 weeks Patient not taking: Reported on 02/05/2020 01/03/20   Lafonda Mosses, MD    Allergies:   Allergies  Allergen Reactions  . Statins Other (See Comments)    Myalgias and memory problems  . Ciprofloxacin Itching    Splotchy redness with itching during IV infusion localized to arm.    Social History:  reports that she has never smoked. She has never used smokeless tobacco. She reports that she does not drink alcohol and does not use drugs.  Family History: Family History  Problem Relation Age of Onset  . Hypertension Mother   . Heart disease  Mother        Died in her 57s  . Stroke Father   . Kidney disease Brother   . Heart disease Brother        also HTN, hyperlipidemia  . Heart attack Brother   . Stroke Sister        x2  . Hypertension Sister      Objective   Physical Exam: Blood pressure (!) 162/93, pulse 73, resp. rate (!) 25, height 5' 1"  (1.549 m), weight 66 kg, SpO2 98 %.  Physical Exam Vitals and nursing note reviewed.  Constitutional:      General: She is not in acute distress.    Appearance: She is not toxic-appearing.  HENT:     Head: Normocephalic.  Cardiovascular:     Rate and Rhythm: Normal rate and regular rhythm.     Heart sounds: Murmur heard.      Comments: Systolic murmur over the aortic region with radiation to carotids Pulmonary:     Effort: Pulmonary effort is normal.     Breath sounds: Normal breath sounds.  Abdominal:     General: There is no distension.     Palpations: Abdomen is soft.  Musculoskeletal:        General: No swelling. Normal range of motion.  Neurological:     Mental Status: She is alert.     Cranial Nerves: Facial asymmetry present.     Motor: Motor function is intact.     Coordination: Coordination is intact.      Comments: Expressive aphasia, very slight left facial droop  Psychiatric:        Mood and Affect: Mood normal. Mood is not anxious.     LABS on Admission: I have personally reviewed all the labs and imaging below    Basic Metabolic Panel: Recent Labs  Lab 03/26/20 1142 03/26/20 1145  NA 141 139  K 3.8 3.9  CL 104 103  CO2 27  --   GLUCOSE 119* 116*  BUN 15 14  CREATININE 0.67 0.70  CALCIUM 9.8  --    Liver Function Tests: Recent Labs  Lab 03/26/20 1142  AST 17  ALT 11  ALKPHOS 73  BILITOT 0.7  PROT 8.1  ALBUMIN 4.5   No results for input(s): LIPASE, AMYLASE in the last 168 hours. No results for input(s): AMMONIA in the last 168 hours. CBC: Recent Labs  Lab 03/26/20 1142 03/26/20 1145  WBC 5.9  --   NEUTROABS 5.0  --   HGB 13.5 13.9  HCT 40.2 41.0  MCV 90.5  --   PLT 300  --    Cardiac Enzymes: No results for input(s): CKTOTAL, CKMB, CKMBINDEX, TROPONINI in the last 168 hours. BNP: Invalid input(s): POCBNP CBG: Recent Labs  Lab 03/26/20 1139  GLUCAP 116*    Radiological Exams on Admission:  CT Head Wo Contrast  Result Date: 03/26/2020 CLINICAL DATA:  Neuro deficit, acute, stroke suspected; aphasia, headache, right neck pain. EXAM: CT HEAD WITHOUT CONTRAST TECHNIQUE: Contiguous axial images were obtained from the base of the skull through the vertex without intravenous contrast. COMPARISON:  Prior head CT 10/06/2010. FINDINGS: Brain: Mild generalized cerebral atrophy. Age-indeterminate lacunar infarct within the right basal ganglia (series 2, image 12) (series 5, image 32). Redemonstrated prominent perivascular space versus chronic lacunar infarct within the right subinsular white matter (series 2, image 11). There is no acute intracranial hemorrhage. No demarcated cortical infarct. No extra-axial fluid collection. No evidence of intracranial mass. No midline shift.  Vascular: No hyperdense vessel.  Atherosclerotic calcifications. Skull: Normal. Negative for  fracture or focal lesion. Sinuses/Orbits: Visualized orbits show no acute finding. No significant paranasal sinus disease or mastoid effusion at the imaged levels IMPRESSION: No evidence of acute intracranial hemorrhage or acute demarcated cortical infarction. Right basal ganglia lacunar infarct, new as compared to the head CT of 10/06/2010, but otherwise age indeterminate. Redemonstrated prominent perivascular space versus chronic lacunar infarct within the right subinsular white matter. Mild cerebral atrophy. Electronically Signed   By: Kellie Simmering DO   On: 03/26/2020 12:19   MR BRAIN WO CONTRAST  Result Date: 03/26/2020 CLINICAL DATA:  Neuro deficit, acute stroke suspected. EXAM: MRI HEAD WITHOUT CONTRAST TECHNIQUE: Multiplanar, multiecho pulse sequences of the brain and surrounding structures were obtained without intravenous contrast. COMPARISON:  Same day head CT. FINDINGS: Brain: There is restricted diffusion involving the inferior left parietal lobe and posterior aspect of the insula. There is mild associated T2/FLAIR hyperintensity without substantial mass effect. Additional scattered T2/FLAIR hyperintensities in the white matter, compatible with chronic microvascular ischemic disease. Mild cerebral volume loss. No hydrocephalus. No acute hemorrhage. No mass lesion or abnormal mass effect. Remote lacunar infarcts in the right basal ganglia. Vascular: Major arterial flow voids are maintained at the skull base. Skull and upper cervical spine: Normal marrow signal. Sinuses/Orbits: Sinuses are clear.  Unremarkable orbits. Other: No mastoid effusions. IMPRESSION: 1. Acute infarct in the inferior left parietal lobe and posterior insula. Mild edema without substantial mass effect. 2. Chronic microvascular ischemic disease and remote lacunar infarcts in the right basal ganglia. Electronically Signed   By: Margaretha Sheffield MD   On: 03/26/2020 13:27   CT CEREBRAL PERFUSION W CONTRAST  Result Date:  03/26/2020 CLINICAL DATA:  Left parietal stroke, episodes of difficulty paraphrasing EXAM: CT ANGIOGRAPHY HEAD AND NECK CT PERFUSION BRAIN TECHNIQUE: Multidetector CT imaging of the head and neck was performed using the standard protocol during bolus administration of intravenous contrast. Multiplanar CT image reconstructions and MIPs were obtained to evaluate the vascular anatomy. Carotid stenosis measurements (when applicable) are obtained utilizing NASCET criteria, using the distal internal carotid diameter as the denominator. Multiphase CT imaging of the brain was performed following IV bolus contrast injection. Subsequent parametric perfusion maps were calculated using RAPID software. CONTRAST:  144m OMNIPAQUE IOHEXOL 350 MG/ML SOLN COMPARISON:  Correlation made with MRI earlier same day FINDINGS: CTA NECK FINDINGS Aortic arch: Calcified plaque along the aortic arch and great vessel origins, which are patent. Right carotid system: Patent. Diffuse atherosclerotic irregularity of the common carotid. Mixed plaque at the bifurcation and proximal internal carotid causing less than 50% stenosis. Left carotid system: Patent. Diffuse atherosclerotic irregularity of the common carotid. Mixed plaque at the bifurcation and ICA origin causing less than 50% stenosis. Noncalcified plaque appears irregular. Vertebral arteries: Patent and codominant. Plaque at the origins without high-grade stenosis. Skeleton: Degenerative changes of the cervical spine. Other neck: No mass or adenopathy. Upper chest: Right chest wall port catheter enters the SVC. No apical lung mass. Review of the MIP images confirms the above findings CTA HEAD FINDINGS Anterior circulation: Intracranial internal carotid arteries are patent with calcified plaque causing mild stenosis of the right and mild to moderate stenosis on the left. Right middle and both anterior cerebral arteries are patent. Left M1 MCA is patent. No proximal left M2 MCA occlusion  identified. Left M2 MCA branch atherosclerotic irregularity and a possible M3 branch occlusion. However, evaluation is suboptimal due to presence of significant venous enhancement.  Posterior circulation: Intracranial vertebral arteries are patent with atherosclerotic irregularity and mild stenosis. Basilar artery is patent. Posterior cerebral arteries are patent with atherosclerotic irregularity and areas of mild to moderate stenosis. Venous sinuses: As permitted by contrast timing, patent. Review of the MIP images confirms the above findings CT Brain Perfusion Findings: CBF (<30%) Volume: 57m Perfusion (Tmax>6.0s) volume: 215mMismatch Volume: 2047mnfarction Location: Territory at risk is posterior to the area of infarction on MRI. IMPRESSION: Perfusion imaging demonstrates does not show infarct from MRI likely due to timing. There is no new core infarction but 20 mL of territory at risk is identified posterior to the area of restricted diffusion. There is no left M1 or proximal M2 MCA occlusion. Atherosclerotic irregularity of left M2 MCA branches and possible M3 MCA branch occlusion. Suboptimal evaluation of distal branches due to venous enhancement. Plaque at the ICA origins causing less than 50% stenosis. Noncalcified plaque at the left ICA origin as an irregular appearance. Electronically Signed   By: PraMacy MisD.   On: 03/26/2020 15:57   CT ANGIO HEAD CODE STROKE  Result Date: 03/26/2020 CLINICAL DATA:  Left parietal stroke, episodes of difficulty paraphrasing EXAM: CT ANGIOGRAPHY HEAD AND NECK CT PERFUSION BRAIN TECHNIQUE: Multidetector CT imaging of the head and neck was performed using the standard protocol during bolus administration of intravenous contrast. Multiplanar CT image reconstructions and MIPs were obtained to evaluate the vascular anatomy. Carotid stenosis measurements (when applicable) are obtained utilizing NASCET criteria, using the distal internal carotid diameter as the  denominator. Multiphase CT imaging of the brain was performed following IV bolus contrast injection. Subsequent parametric perfusion maps were calculated using RAPID software. CONTRAST:  100m57mNIPAQUE IOHEXOL 350 MG/ML SOLN COMPARISON:  Correlation made with MRI earlier same day FINDINGS: CTA NECK FINDINGS Aortic arch: Calcified plaque along the aortic arch and great vessel origins, which are patent. Right carotid system: Patent. Diffuse atherosclerotic irregularity of the common carotid. Mixed plaque at the bifurcation and proximal internal carotid causing less than 50% stenosis. Left carotid system: Patent. Diffuse atherosclerotic irregularity of the common carotid. Mixed plaque at the bifurcation and ICA origin causing less than 50% stenosis. Noncalcified plaque appears irregular. Vertebral arteries: Patent and codominant. Plaque at the origins without high-grade stenosis. Skeleton: Degenerative changes of the cervical spine. Other neck: No mass or adenopathy. Upper chest: Right chest wall port catheter enters the SVC. No apical lung mass. Review of the MIP images confirms the above findings CTA HEAD FINDINGS Anterior circulation: Intracranial internal carotid arteries are patent with calcified plaque causing mild stenosis of the right and mild to moderate stenosis on the left. Right middle and both anterior cerebral arteries are patent. Left M1 MCA is patent. No proximal left M2 MCA occlusion identified. Left M2 MCA branch atherosclerotic irregularity and a possible M3 branch occlusion. However, evaluation is suboptimal due to presence of significant venous enhancement. Posterior circulation: Intracranial vertebral arteries are patent with atherosclerotic irregularity and mild stenosis. Basilar artery is patent. Posterior cerebral arteries are patent with atherosclerotic irregularity and areas of mild to moderate stenosis. Venous sinuses: As permitted by contrast timing, patent. Review of the MIP images  confirms the above findings CT Brain Perfusion Findings: CBF (<30%) Volume: 0mL 63mfusion (Tmax>6.0s) volume: 20mL 45match Volume: 20mL I12mction Location: Territory at risk is posterior to the area of infarction on MRI. IMPRESSION: Perfusion imaging demonstrates does not show infarct from MRI likely due to timing. There is no new core infarction but 20 mL of territory  at risk is identified posterior to the area of restricted diffusion. There is no left M1 or proximal M2 MCA occlusion. Atherosclerotic irregularity of left M2 MCA branches and possible M3 MCA branch occlusion. Suboptimal evaluation of distal branches due to venous enhancement. Plaque at the ICA origins causing less than 50% stenosis. Noncalcified plaque at the left ICA origin as an irregular appearance. Electronically Signed   By: Macy Mis M.D.   On: 03/26/2020 15:57   CT ANGIO NECK CODE STROKE  Result Date: 03/26/2020 CLINICAL DATA:  Left parietal stroke, episodes of difficulty paraphrasing EXAM: CT ANGIOGRAPHY HEAD AND NECK CT PERFUSION BRAIN TECHNIQUE: Multidetector CT imaging of the head and neck was performed using the standard protocol during bolus administration of intravenous contrast. Multiplanar CT image reconstructions and MIPs were obtained to evaluate the vascular anatomy. Carotid stenosis measurements (when applicable) are obtained utilizing NASCET criteria, using the distal internal carotid diameter as the denominator. Multiphase CT imaging of the brain was performed following IV bolus contrast injection. Subsequent parametric perfusion maps were calculated using RAPID software. CONTRAST:  136m OMNIPAQUE IOHEXOL 350 MG/ML SOLN COMPARISON:  Correlation made with MRI earlier same day FINDINGS: CTA NECK FINDINGS Aortic arch: Calcified plaque along the aortic arch and great vessel origins, which are patent. Right carotid system: Patent. Diffuse atherosclerotic irregularity of the common carotid. Mixed plaque at the bifurcation  and proximal internal carotid causing less than 50% stenosis. Left carotid system: Patent. Diffuse atherosclerotic irregularity of the common carotid. Mixed plaque at the bifurcation and ICA origin causing less than 50% stenosis. Noncalcified plaque appears irregular. Vertebral arteries: Patent and codominant. Plaque at the origins without high-grade stenosis. Skeleton: Degenerative changes of the cervical spine. Other neck: No mass or adenopathy. Upper chest: Right chest wall port catheter enters the SVC. No apical lung mass. Review of the MIP images confirms the above findings CTA HEAD FINDINGS Anterior circulation: Intracranial internal carotid arteries are patent with calcified plaque causing mild stenosis of the right and mild to moderate stenosis on the left. Right middle and both anterior cerebral arteries are patent. Left M1 MCA is patent. No proximal left M2 MCA occlusion identified. Left M2 MCA branch atherosclerotic irregularity and a possible M3 branch occlusion. However, evaluation is suboptimal due to presence of significant venous enhancement. Posterior circulation: Intracranial vertebral arteries are patent with atherosclerotic irregularity and mild stenosis. Basilar artery is patent. Posterior cerebral arteries are patent with atherosclerotic irregularity and areas of mild to moderate stenosis. Venous sinuses: As permitted by contrast timing, patent. Review of the MIP images confirms the above findings CT Brain Perfusion Findings: CBF (<30%) Volume: 028mPerfusion (Tmax>6.0s) volume: 2040mismatch Volume: 35m33mfarction Location: Territory at risk is posterior to the area of infarction on MRI. IMPRESSION: Perfusion imaging demonstrates does not show infarct from MRI likely due to timing. There is no new core infarction but 20 mL of territory at risk is identified posterior to the area of restricted diffusion. There is no left M1 or proximal M2 MCA occlusion. Atherosclerotic irregularity of left M2  MCA branches and possible M3 MCA branch occlusion. Suboptimal evaluation of distal branches due to venous enhancement. Plaque at the ICA origins causing less than 50% stenosis. Noncalcified plaque at the left ICA origin as an irregular appearance. Electronically Signed   By: PranMacy Mis.   On: 03/26/2020 15:57      EKG: Independently reviewed.    A & P   Principal Problem:   Expressive aphasia Active Problems:  Essential hypertension   Atrial fibrillation, chronic (HCC)   Stroke ([place])   1. TIA versus left parietal lobe and posterior insula acute infarct with resultant expressive aphasia a. Acute infarct on MRI but not noted on perfusion studies b. Neurology on board recommended transfer to [facility] for further evaluation c. Telemetry d. Echo e. Aspirin 81 mg daily f. Lipid panel g. HbA1c h. Permissive hypertension  2. Hypertension a. Permissive hypertension as above  3. Atrial fibrillation, rate controlled a. Telemetry b. On Cardizem c. Not on anticoagulation  4. Uterine cancer with colonic metastasis s/p colostomy, stable    DVT prophylaxis: Lovenox   Code Status: Full Code  Diet: N.p.o. Family Communication: Admission, patients condition and plan of care including tests being ordered have been discussed with the patient who indicates understanding and agrees with the plan and Code Status. Patient's son was updated  Disposition Plan: The appropriate patient status for this patient is OBSERVATION. Observation status is judged to be reasonable and necessary in order to provide the required intensity of service to ensure the patient's safety. The patient's presenting symptoms, physical exam findings, and initial radiographic and laboratory data in the context of their medical condition is felt to place them at decreased risk for further clinical deterioration. Furthermore, it is anticipated that the patient will be medically stable for discharge from the hospital within 2  midnights of admission. The following factors support the patient status of observation.   " The patient's presenting symptoms include aphasia. " The physical exam findings include aphasia. " The initial radiographic and laboratory data are possible stroke.    Status is: Observation  The patient remains OBS appropriate and will d/c before 2 midnights.  Dispo: The patient is from: Home              Anticipated d/c is to: Home              Anticipated d/c date is: 1 day              Patient currently is not medically stable to d/c.         The medical decision making on this patient was of high complexity and the patient is at high risk for clinical deterioration, therefore this is a level 3  admission.  Consultants  . neurology  Procedures  . none  Time Spent on Admission: 72 minutes    Harold Hedge, DO [facility]  03/26/2020, 5:14 PM

## 2020-03-26 NOTE — Consult Note (Addendum)
[facility] TELEMEDICINE  CONSULT   Date of service: March 26, 2020 Patient Name: Shannon Scott MRN:  [id number] DOB:  1940-08-05 Requesting Provider: Tommi Rumps Location of the provider: [facility] Neurohospitalist office Location of the patient: [facility] Reason for consult: "Stroke code"   This consult was provided via telemedicine with 2-way video and audio communication. The patient/family was informed that care would be provided in this way and agreed to receive care in this manner.    History of Present Illness  Shannon Scott is a 79 y.o. female with PMH significant for asthma, CAD, HLD, afibb, HTN, Uterina Ca locally advanced who presents with concern for expressive aphasia, R sided headache and neck pain. She was noted to have word finding difficulty. Initial LKW was reported to be 2100 on 03/25/20. Patient improved in the ED and was able to provide history herself. Reports that she woke up at [employer] and was fine in the morning. She had word finding difficulty and confusion with headache that started at 0900 on 03/26/20. Reports R sided headache and feeling confused.  Vitals with SBP elevated to 198/110, glucose of 116, sats normal, normal creatinine on labs.  On tele evaluation, patient had a NIHSS of 0.  Stroke Measures   Last Known Well: 0900 on 03/26/20. TPA Given: no, NIHSS of 0 IR Thrombectomy: No, NIHSS of 0 mRS: Modified Rankin Scale: 1-No significant post stroke disability and can perform usual duties with stroke symptoms Time of teleneurologist evaluation: 11:40   Vitals   Vitals:   03/26/20 1227 03/26/20 1328 03/26/20 1330 03/26/20 1400  BP: (!) 182/66 (!) 183/95 (!) 201/66 (!) 210/76  Pulse: 84 80 70 70  Resp: (!) 21 18 (!) 21 17  SpO2: 100% 99% 98% 97%     There is no height or weight on file to calculate BMI.   Tele-neuro Exam and NIHSS   General: Awake, alert, hard of hearing.  1A: Level of Consciousness - 0 1B: Ask Month  and Age - 0 1C: 'Blink Eyes' & 'Squeeze Hands' - 0 2: Test Horizontal Extraocular Movements - 0 3: Test Visual Fields - 0 4: Test Facial Palsy - 0 5A: Test Left Arm Motor Drift - 0 5B: Test Right Arm Motor Drift - 0 6A: Test Left Leg Motor Drift - 0 6B: Test Right Leg Motor Drift - 0 7: Test Limb Ataxia - 0 8: Test Sensation - 0 9: Test Language/Aphasia- 0 10: Test Dysarthria - 0 11: Test Extinction/Inattention - 0 NIHSS score: 0  Other exam findings: None  Imaging and Labs   CBC:  Recent Labs  Lab 03/26/20 1142 03/26/20 1145  WBC 5.9  --   NEUTROABS 5.0  --   HGB 13.5 13.9  HCT 40.2 41.0  MCV 90.5  --   PLT 300  --     Basic Metabolic Panel:  Lab Results  Component Value Date   NA 139 03/26/2020   K 3.9 03/26/2020   CO2 27 03/26/2020   GLUCOSE 116 (H) 03/26/2020   BUN 14 03/26/2020   CREATININE 0.70 03/26/2020   CALCIUM 9.8 03/26/2020   GFRNONAA >60 03/26/2020   GFRAA >60 02/05/2020   Lipid Panel:  Lab Results  Component Value Date   LDLCALC 257 (H) 08/02/2016   HgbA1c: No results found for: HGBA1C Urine Drug Screen: No results found for: LABOPIA, COCAINSCRNUR, LABBENZ, AMPHETMU, THCU, LABBARB  Alcohol Level No results found for: Eastman R  Seif is a 79 y.o. female with PMH significant for asthma, CAD, HLD, HTN, Uterina Ca locally advanced who presents with concern for expressive aphasia, R sided headache and neck pain.Marland Kitchen Her limited tele-neurologic examination is notable for no focal deficit, intact cognition. Her SBP as elevated to 710 systolic on presentatin, normal glucose.  Her presentation could be concerning for PRES or a TIA/minor ischemic stroke. If MRI Brain shows PRES, she would need tighter blood pressure control. Recommendations as below.  Recommendations  - Stroke code cancelled due to resolution of symptoms. - Recommend CTH without contrast, if negative for Intracranial Hemorrhage, recommend giving Aspirin 5m right  now. - MRI Brain without contrast  TIA workup as below: - Recommend Vascular imaging with MRA Angio Head without contrast and [place]otid doppler - TTE about 5 months ago with EF of 562-69% grade 2 diastolic dysfunction. No need to repeat it. - Recommend obtaining Lipid panel with LDL - Please start statin if LDL > 70 - Recommend HbA1c - Antithrombotic - Aspirin 844mdaily if CTH is negative for an ICH. - Recommend DVT ppx - SBP goal - permissive hypertension first 24 h < 220/110. If however, the MRI is concerning for PRES, recommend gradual normotension. - Recommend Telemetry monitoring for arrythmia - Stroke education booklet - Recommend PT/OT/SLP consult ______________________________________________________________________  This patient is receiving care for possible acute neurological changes. There was 35 minutes of care by this provider at the time of service, including time for direct evaluation via telemedicine, review of medical records, imaging studies and discussion of findings with providers, the patient and/or family.  [facility] Pager Number [phone] 7pm- 7am, please page neurology on call as listed in [facility]

## 2020-03-26 NOTE — ED Triage Notes (Signed)
Pt brought in by son for difficulty getting words to come out for an hour. Pt son states that pt lives alone and last time they saw pt she was normal but that was last night at 9pm.

## 2020-03-26 NOTE — ED Notes (Signed)
Tele-neuro cart at bedside as well as Neuro PA and Neysa Bonito, MD.

## 2020-03-26 NOTE — ED Notes (Signed)
Patient requesting something to drink.  Per Neysa Bonito, MD he would rather patient stay NPO at this time until we know what CT shows.  Patient and son made aware.

## 2020-03-26 NOTE — ED Notes (Signed)
Report given to [facility], ETA is 10 mins.

## 2020-03-26 NOTE — Progress Notes (Signed)
Brief Neuro Update:  CT Angio Head and Neck with no LVO, CTP with a 20cc mismatch. Maybe a ?left M3 naorrowing with good flow distally. Discussed with Neurointerventionalist, not a candidate for any thrombectomy. NIHSS of 1 for mild to moderate aphasia. Plan is permissive hypertension for 24 hours.  Son updated over the phone.  [place] Pager Number [phone]

## 2020-03-26 NOTE — ED Notes (Signed)
Patient denies pain and is resting comfortably.  

## 2020-03-26 NOTE — ED Notes (Signed)
Notified Agricultural consultant at [facility] [employer] that patient is coming.

## 2020-03-26 NOTE — Progress Notes (Signed)
Brief Neuro Update:  A code stroke was called a second time with her now making paraphasic errors and making errors on identifying 2 objects (glove, cactus) on the stroke aphasia cards.  NIHSS now a 1 for moderate aphasia.  We will get a STAT CT Angio head and neck and CT perfusion to evaluate for any LVO and potential mismatch.   [place] Pager Number [phone]

## 2020-03-27 ENCOUNTER — Observation Stay (HOSPITAL_BASED_OUTPATIENT_CLINIC_OR_DEPARTMENT_OTHER): Payer: Medicare Other

## 2020-03-27 DIAGNOSIS — I350 Nonrheumatic aortic (valve) stenosis: Secondary | ICD-10-CM

## 2020-03-27 DIAGNOSIS — I10 Essential (primary) hypertension: Secondary | ICD-10-CM | POA: Diagnosis not present

## 2020-03-27 DIAGNOSIS — I340 Nonrheumatic mitral (valve) insufficiency: Secondary | ICD-10-CM | POA: Diagnosis not present

## 2020-03-27 DIAGNOSIS — I361 Nonrheumatic tricuspid (valve) insufficiency: Secondary | ICD-10-CM

## 2020-03-27 DIAGNOSIS — R4701 Aphasia: Secondary | ICD-10-CM | POA: Diagnosis not present

## 2020-03-27 LAB — ECHOCARDIOGRAM COMPLETE
AR max vel: 1.36 cm2
AV Area VTI: 1.4 cm2
AV Area mean vel: 1.26 cm2
AV Mean grad: 19 mmHg
AV Peak grad: 31.9 mmHg
Ao pk vel: 2.83 m/s
Area-P 1/2: 5.31 cm2
Height: 61 in
S' Lateral: 2.6 cm
Weight: 2328.06 oz

## 2020-03-27 MED ORDER — SENNOSIDES-DOCUSATE SODIUM 8.6-50 MG PO TABS
1.0000 | ORAL_TABLET | Freq: Every evening | ORAL | Status: DC | PRN
  Administered 2020-03-27: 1 via ORAL
  Filled 2020-03-27: qty 1

## 2020-03-27 MED ORDER — MORPHINE SULFATE (PF) 2 MG/ML IV SOLN
2.0000 mg | Freq: Once | INTRAVENOUS | Status: AC
  Administered 2020-03-27: 2 mg via INTRAVENOUS
  Filled 2020-03-27: qty 1

## 2020-03-27 MED ORDER — APIXABAN 5 MG PO TABS
5.0000 mg | ORAL_TABLET | Freq: Two times a day (BID) | ORAL | Status: DC
  Administered 2020-03-27 – 2020-03-29 (×4): 5 mg via ORAL
  Filled 2020-03-27 (×5): qty 1

## 2020-03-27 NOTE — Progress Notes (Signed)
PROGRESS NOTE    ANE CONERLY  UUV:[id number] DOB: March 09, 1941 DOA: 03/26/2020 PCP: Leeroy Cha, MD   Brief Narrative:  79 year old with history of CAD status post CABG, TIA, uterine cancer with colonic metastases status post colostomy, A. fib, HTN, HLD, hypothyroidism admitted for slurred speech.  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  CTA head and neck showed left M3 MCA branch occlusion.   Assessment & Plan:   Principal Problem:   Expressive aphasia Active Problems:   Essential hypertension   Atrial fibrillation, chronic (HCC)   Stroke (HCC)  Slurred speech Acute left parietal and posterior insular infarct -MRI showed acute left parietal/posterior insular infarct, CTA head and neck showed left M3 MCA branch occlusion.  Neurology team following.  Case discussed by neuro interventional list, not a candidate for thrombectomy. -Echocardiogram performed-results pending -Lipid panel, A1c-pending -PT/OT  Essential hypertension, uncontrolled -On diltiazem.  Will allow blood pressure to go as high as 220 for permissive hypertension.  History of uterine cancer with colonic metastases status post colostomy -Stable  CAD status post CABG -Currently chest pain-free.  Hypothyroidism -Synthroid 50 mcg daily   DVT prophylaxis: Lovenox Code Status: Full code Family Communication: Son at bedside   Dispo: The patient is from: Home              Anticipated d/c is to: Home              Anticipated d/c date is: 1 day              Patient currently is not medically stable to d/c.  Ongoing work-up for acute CVA, neurology to see the patient.       Body mass index is 27.49 kg/m.     Subjective: Patient still having trouble expressing herself.  According to son she is pretty much same as yesterday.  No new complaints.  Review of Systems Otherwise negative except as per HPI, including: General: Denies fever, chills, night sweats or  unintended weight loss. Resp: Denies cough, wheezing, shortness of breath. Cardiac: Denies chest pain, palpitations, orthopnea, paroxysmal nocturnal dyspnea. GI: Denies abdominal pain, nausea, vomiting, diarrhea or constipation GU: Denies dysuria, frequency, hesitancy or incontinence MS: Denies muscle aches, joint pain or swelling Neuro: Denies headache, neurologic deficits (focal weakness, numbness, tingling), abnormal gait Psych: Denies anxiety, depression, SI/HI/AVH Skin: Denies new rashes or lesions ID: Denies sick contacts, exotic exposures, travel  Examination:  General exam: Appears calm and comfortable, elderly frail Respiratory system: Clear to auscultation. Respiratory effort normal. Cardiovascular system: S1 & S2 heard, RRR. No JVD, murmurs, rubs, gallops or clicks. No pedal edema. Gastrointestinal system: Abdomen is nondistended, soft and nontender. No organomegaly or masses felt. Normal bowel sounds heard. Central nervous system: Alert awake oriented X3 but having expressive aphasia. Extremities: Symmetric 4 x 5 power. Skin: No rashes, lesions or ulcers Psychiatry: Judgement and insight appear normal. Mood & affect appropriate.   Colostomy in left lower quadrant noted  Objective: Vitals:   03/26/20 2215 03/26/20 2321 03/27/20 0318 03/27/20 0810  BP: (!) 179/65 (!) 175/62 (!) 185/73 (!) 193/84  Pulse: 82 83 97 94  Resp: 18 18 18 18   Temp: 99.3 F (37.4 C) 98.6 F (37 C) 98.2 F (36.8 C) 98.9 F (37.2 C)  TempSrc: Oral   Oral  SpO2: 96% 93% 95% 98%  Weight:      Height:        Intake/Output Summary (Last 24 hours) at 03/27/2020 0830 Last data filed  at 03/27/2020 0410 Gross per 24 hour  Intake 545.8 ml  Output 1050 ml  Net -504.2 ml   Filed Weights   03/26/20 1512  Weight: 66 kg     Data Reviewed:   CBC: Recent Labs  Lab 03/26/20 1142 03/26/20 1145  WBC 5.9  --   NEUTROABS 5.0  --   HGB 13.5 13.9  HCT 40.2 41.0  MCV 90.5  --   PLT 300  --      Basic Metabolic Panel: Recent Labs  Lab 03/26/20 1142 03/26/20 1145  NA 141 139  K 3.8 3.9  CL 104 103  CO2 27  --   GLUCOSE 119* 116*  BUN 15 14  CREATININE 0.67 0.70  CALCIUM 9.8  --    GFR: Estimated Creatinine Clearance: 50.4 mL/min (by C-G formula based on SCr of 0.7 mg/dL). Liver Function Tests: Recent Labs  Lab 03/26/20 1142  AST 17  ALT 11  ALKPHOS 73  BILITOT 0.7  PROT 8.1  ALBUMIN 4.5   No results for input(s): LIPASE, AMYLASE in the last 168 hours. No results for input(s): AMMONIA in the last 168 hours. Coagulation Profile: Recent Labs  Lab 03/26/20 1142  INR 1.0   Cardiac Enzymes: No results for input(s): CKTOTAL, CKMB, CKMBINDEX, TROPONINI in the last 168 hours. BNP (last 3 results) No results for input(s): PROBNP in the last 8760 hours. HbA1C: No results for input(s): HGBA1C in the last 72 hours. CBG: Recent Labs  Lab 03/26/20 1139 03/26/20 1728  GLUCAP 116* 104*   Lipid Profile: No results for input(s): CHOL, HDL, LDLCALC, TRIG, CHOLHDL, LDLDIRECT in the last 72 hours. Thyroid Function Tests: No results for input(s): TSH, T4TOTAL, FREET4, T3FREE, THYROIDAB in the last 72 hours. Anemia Panel: No results for input(s): VITAMINB12, FOLATE, FERRITIN, TIBC, IRON, RETICCTPCT in the last 72 hours. Sepsis Labs: No results for input(s): PROCALCITON, LATICACIDVEN in the last 168 hours.  Recent Results (from the past 240 hour(s))  Respiratory Panel by RT PCR (Flu A&B, Covid) - Nasopharyngeal Swab     Status: None   Collection Time: 03/26/20  3:10 PM   Specimen: Nasopharyngeal Swab  Result Value Ref Range Status   SARS Coronavirus 2 by RT PCR NEGATIVE NEGATIVE Final    Comment: (NOTE) SARS-CoV-2 target nucleic acids are NOT DETECTED.  The SARS-CoV-2 RNA is generally detectable in upper respiratoy specimens during the acute phase of infection. The lowest concentration of SARS-CoV-2 viral copies this assay can detect is 131 copies/mL. A  negative result does not preclude SARS-Cov-2 infection and should not be used as the sole basis for treatment or other patient management decisions. A negative result may occur with  improper specimen collection/handling, submission of specimen other than nasopharyngeal swab, presence of viral mutation(s) within the areas targeted by this assay, and inadequate number of viral copies (<131 copies/mL). A negative result must be combined with clinical observations, patient history, and epidemiological information. The expected result is Negative.  Fact Sheet for Patients:  PinkCheek.be  Fact Sheet for Healthcare Providers:  GravelBags.it  This test is no t yet approved or cleared by the [place] FDA and  has been authorized for detection and/or diagnosis of SARS-CoV-2 by FDA under an Emergency Use Authorization (EUA). This EUA will remain  in effect (meaning this test can be used) for the duration of the COVID-19 declaration under Section 564(b)(1) of the Act, 21 U.S.C. section 360bbb-3(b)(1), unless the authorization is terminated or revoked sooner.  Influenza A by PCR NEGATIVE NEGATIVE Final   Influenza B by PCR NEGATIVE NEGATIVE Final    Comment: (NOTE) The Xpert Xpress SARS-CoV-2/FLU/RSV assay is intended as an aid in  the diagnosis of influenza from Nasopharyngeal swab specimens and  should not be used as a sole basis for treatment. Nasal washings and  aspirates are unacceptable for Xpert Xpress SARS-CoV-2/FLU/RSV  testing.  Fact Sheet for Patients: PinkCheek.be  Fact Sheet for Healthcare Providers: GravelBags.it  This test is not yet approved or cleared by the [place] FDA and  has been authorized for detection and/or diagnosis of SARS-CoV-2 by  FDA under an Emergency Use Authorization (EUA). This EUA will remain  in effect (meaning this test can  be used) for the duration of the  Covid-19 declaration under Section 564(b)(1) of the Act, 21  U.S.C. section 360bbb-3(b)(1), unless the authorization is  terminated or revoked. Performed at [facility], [place] [address], [place], [place] [address]          Radiology Studies: CT Head Wo Contrast  Result Date: 03/26/2020 CLINICAL DATA:  Neuro deficit, acute, stroke suspected; aphasia, headache, right neck pain. EXAM: CT HEAD WITHOUT CONTRAST TECHNIQUE: Contiguous axial images were obtained from the base of the skull through the vertex without intravenous contrast. COMPARISON:  Prior head CT 10/06/2010. FINDINGS: Brain: Mild generalized cerebral atrophy. Age-indeterminate lacunar infarct within the right basal ganglia (series 2, image 12) (series 5, image 32). Redemonstrated prominent perivascular space versus chronic lacunar infarct within the right subinsular white matter (series 2, image 11). There is no acute intracranial hemorrhage. No demarcated cortical infarct. No extra-axial fluid collection. No evidence of intracranial mass. No midline shift. Vascular: No hyperdense vessel.  Atherosclerotic calcifications. Skull: Normal. Negative for fracture or focal lesion. Sinuses/Orbits: Visualized orbits show no acute finding. No significant paranasal sinus disease or mastoid effusion at the imaged levels IMPRESSION: No evidence of acute intracranial hemorrhage or acute demarcated cortical infarction. Right basal ganglia lacunar infarct, new as compared to the head CT of 10/06/2010, but otherwise age indeterminate. Redemonstrated prominent perivascular space versus chronic lacunar infarct within the right subinsular white matter. Mild cerebral atrophy. Electronically Signed   By: Kellie Simmering DO   On: 03/26/2020 12:19   MR BRAIN WO CONTRAST  Result Date: 03/26/2020 CLINICAL DATA:  Neuro deficit, acute stroke suspected. EXAM: MRI HEAD WITHOUT CONTRAST TECHNIQUE: Multiplanar,  multiecho pulse sequences of the brain and surrounding structures were obtained without intravenous contrast. COMPARISON:  Same day head CT. FINDINGS: Brain: There is restricted diffusion involving the inferior left parietal lobe and posterior aspect of the insula. There is mild associated T2/FLAIR hyperintensity without substantial mass effect. Additional scattered T2/FLAIR hyperintensities in the white matter, compatible with chronic microvascular ischemic disease. Mild cerebral volume loss. No hydrocephalus. No acute hemorrhage. No mass lesion or abnormal mass effect. Remote lacunar infarcts in the right basal ganglia. Vascular: Major arterial flow voids are maintained at the skull base. Skull and upper cervical spine: Normal marrow signal. Sinuses/Orbits: Sinuses are clear.  Unremarkable orbits. Other: No mastoid effusions. IMPRESSION: 1. Acute infarct in the inferior left parietal lobe and posterior insula. Mild edema without substantial mass effect. 2. Chronic microvascular ischemic disease and remote lacunar infarcts in the right basal ganglia. Electronically Signed   By: Margaretha Sheffield MD   On: 03/26/2020 13:27   CT CEREBRAL PERFUSION W CONTRAST  Result Date: 03/26/2020 CLINICAL DATA:  Left parietal stroke, episodes of difficulty paraphrasing EXAM: CT ANGIOGRAPHY HEAD AND  NECK CT PERFUSION BRAIN TECHNIQUE: Multidetector CT imaging of the head and neck was performed using the standard protocol during bolus administration of intravenous contrast. Multiplanar CT image reconstructions and MIPs were obtained to evaluate the vascular anatomy. Carotid stenosis measurements (when applicable) are obtained utilizing NASCET criteria, using the distal internal carotid diameter as the denominator. Multiphase CT imaging of the brain was performed following IV bolus contrast injection. Subsequent parametric perfusion maps were calculated using RAPID software. CONTRAST:  169m OMNIPAQUE IOHEXOL 350 MG/ML SOLN  COMPARISON:  Correlation made with MRI earlier same day FINDINGS: CTA NECK FINDINGS Aortic arch: Calcified plaque along the aortic arch and great vessel origins, which are patent. Right carotid system: Patent. Diffuse atherosclerotic irregularity of the common carotid. Mixed plaque at the bifurcation and proximal internal carotid causing less than 50% stenosis. Left carotid system: Patent. Diffuse atherosclerotic irregularity of the common carotid. Mixed plaque at the bifurcation and ICA origin causing less than 50% stenosis. Noncalcified plaque appears irregular. Vertebral arteries: Patent and codominant. Plaque at the origins without high-grade stenosis. Skeleton: Degenerative changes of the cervical spine. Other neck: No mass or adenopathy. Upper chest: Right chest wall port catheter enters the SVC. No apical lung mass. Review of the MIP images confirms the above findings CTA HEAD FINDINGS Anterior circulation: Intracranial internal carotid arteries are patent with calcified plaque causing mild stenosis of the right and mild to moderate stenosis on the left. Right middle and both anterior cerebral arteries are patent. Left M1 MCA is patent. No proximal left M2 MCA occlusion identified. Left M2 MCA branch atherosclerotic irregularity and a possible M3 branch occlusion. However, evaluation is suboptimal due to presence of significant venous enhancement. Posterior circulation: Intracranial vertebral arteries are patent with atherosclerotic irregularity and mild stenosis. Basilar artery is patent. Posterior cerebral arteries are patent with atherosclerotic irregularity and areas of mild to moderate stenosis. Venous sinuses: As permitted by contrast timing, patent. Review of the MIP images confirms the above findings CT Brain Perfusion Findings: CBF (<30%) Volume: 038mPerfusion (Tmax>6.0s) volume: 204mismatch Volume: 79m30mfarction Location: Territory at risk is posterior to the area of infarction on MRI.  IMPRESSION: Perfusion imaging demonstrates does not show infarct from MRI likely due to timing. There is no new core infarction but 20 mL of territory at risk is identified posterior to the area of restricted diffusion. There is no left M1 or proximal M2 MCA occlusion. Atherosclerotic irregularity of left M2 MCA branches and possible M3 MCA branch occlusion. Suboptimal evaluation of distal branches due to venous enhancement. Plaque at the ICA origins causing less than 50% stenosis. Noncalcified plaque at the left ICA origin as an irregular appearance. Electronically Signed   By: PranMacy Mis.   On: 03/26/2020 15:57   CT ANGIO HEAD CODE STROKE  Result Date: 03/26/2020 CLINICAL DATA:  Left parietal stroke, episodes of difficulty paraphrasing EXAM: CT ANGIOGRAPHY HEAD AND NECK CT PERFUSION BRAIN TECHNIQUE: Multidetector CT imaging of the head and neck was performed using the standard protocol during bolus administration of intravenous contrast. Multiplanar CT image reconstructions and MIPs were obtained to evaluate the vascular anatomy. Carotid stenosis measurements (when applicable) are obtained utilizing NASCET criteria, using the distal internal carotid diameter as the denominator. Multiphase CT imaging of the brain was performed following IV bolus contrast injection. Subsequent parametric perfusion maps were calculated using RAPID software. CONTRAST:  100mL109mIPAQUE IOHEXOL 350 MG/ML SOLN COMPARISON:  Correlation made with MRI earlier same day FINDINGS: CTA NECK FINDINGS Aortic arch: Calcified  plaque along the aortic arch and great vessel origins, which are patent. Right carotid system: Patent. Diffuse atherosclerotic irregularity of the common carotid. Mixed plaque at the bifurcation and proximal internal carotid causing less than 50% stenosis. Left carotid system: Patent. Diffuse atherosclerotic irregularity of the common carotid. Mixed plaque at the bifurcation and ICA origin causing less than 50%  stenosis. Noncalcified plaque appears irregular. Vertebral arteries: Patent and codominant. Plaque at the origins without high-grade stenosis. Skeleton: Degenerative changes of the cervical spine. Other neck: No mass or adenopathy. Upper chest: Right chest wall port catheter enters the SVC. No apical lung mass. Review of the MIP images confirms the above findings CTA HEAD FINDINGS Anterior circulation: Intracranial internal carotid arteries are patent with calcified plaque causing mild stenosis of the right and mild to moderate stenosis on the left. Right middle and both anterior cerebral arteries are patent. Left M1 MCA is patent. No proximal left M2 MCA occlusion identified. Left M2 MCA branch atherosclerotic irregularity and a possible M3 branch occlusion. However, evaluation is suboptimal due to presence of significant venous enhancement. Posterior circulation: Intracranial vertebral arteries are patent with atherosclerotic irregularity and mild stenosis. Basilar artery is patent. Posterior cerebral arteries are patent with atherosclerotic irregularity and areas of mild to moderate stenosis. Venous sinuses: As permitted by contrast timing, patent. Review of the MIP images confirms the above findings CT Brain Perfusion Findings: CBF (<30%) Volume: 83m Perfusion (Tmax>6.0s) volume: 275mMismatch Volume: 2058mnfarction Location: Territory at risk is posterior to the area of infarction on MRI. IMPRESSION: Perfusion imaging demonstrates does not show infarct from MRI likely due to timing. There is no new core infarction but 20 mL of territory at risk is identified posterior to the area of restricted diffusion. There is no left M1 or proximal M2 MCA occlusion. Atherosclerotic irregularity of left M2 MCA branches and possible M3 MCA branch occlusion. Suboptimal evaluation of distal branches due to venous enhancement. Plaque at the ICA origins causing less than 50% stenosis. Noncalcified plaque at the left ICA origin  as an irregular appearance. Electronically Signed   By: PraMacy MisD.   On: 03/26/2020 15:57   CT ANGIO NECK CODE STROKE  Result Date: 03/26/2020 CLINICAL DATA:  Left parietal stroke, episodes of difficulty paraphrasing EXAM: CT ANGIOGRAPHY HEAD AND NECK CT PERFUSION BRAIN TECHNIQUE: Multidetector CT imaging of the head and neck was performed using the standard protocol during bolus administration of intravenous contrast. Multiplanar CT image reconstructions and MIPs were obtained to evaluate the vascular anatomy. Carotid stenosis measurements (when applicable) are obtained utilizing NASCET criteria, using the distal internal carotid diameter as the denominator. Multiphase CT imaging of the brain was performed following IV bolus contrast injection. Subsequent parametric perfusion maps were calculated using RAPID software. CONTRAST:  100m40mNIPAQUE IOHEXOL 350 MG/ML SOLN COMPARISON:  Correlation made with MRI earlier same day FINDINGS: CTA NECK FINDINGS Aortic arch: Calcified plaque along the aortic arch and great vessel origins, which are patent. Right carotid system: Patent. Diffuse atherosclerotic irregularity of the common carotid. Mixed plaque at the bifurcation and proximal internal carotid causing less than 50% stenosis. Left carotid system: Patent. Diffuse atherosclerotic irregularity of the common carotid. Mixed plaque at the bifurcation and ICA origin causing less than 50% stenosis. Noncalcified plaque appears irregular. Vertebral arteries: Patent and codominant. Plaque at the origins without high-grade stenosis. Skeleton: Degenerative changes of the cervical spine. Other neck: No mass or adenopathy. Upper chest: Right chest wall port catheter enters the SVC. No apical lung  mass. Review of the MIP images confirms the above findings CTA HEAD FINDINGS Anterior circulation: Intracranial internal carotid arteries are patent with calcified plaque causing mild stenosis of the right and mild to  moderate stenosis on the left. Right middle and both anterior cerebral arteries are patent. Left M1 MCA is patent. No proximal left M2 MCA occlusion identified. Left M2 MCA branch atherosclerotic irregularity and a possible M3 branch occlusion. However, evaluation is suboptimal due to presence of significant venous enhancement. Posterior circulation: Intracranial vertebral arteries are patent with atherosclerotic irregularity and mild stenosis. Basilar artery is patent. Posterior cerebral arteries are patent with atherosclerotic irregularity and areas of mild to moderate stenosis. Venous sinuses: As permitted by contrast timing, patent. Review of the MIP images confirms the above findings CT Brain Perfusion Findings: CBF (<30%) Volume: 91m Perfusion (Tmax>6.0s) volume: 215mMismatch Volume: 207mnfarction Location: Territory at risk is posterior to the area of infarction on MRI. IMPRESSION: Perfusion imaging demonstrates does not show infarct from MRI likely due to timing. There is no new core infarction but 20 mL of territory at risk is identified posterior to the area of restricted diffusion. There is no left M1 or proximal M2 MCA occlusion. Atherosclerotic irregularity of left M2 MCA branches and possible M3 MCA branch occlusion. Suboptimal evaluation of distal branches due to venous enhancement. Plaque at the ICA origins causing less than 50% stenosis. Noncalcified plaque at the left ICA origin as an irregular appearance. Electronically Signed   By: PraMacy MisD.   On: 03/26/2020 15:57        Scheduled Meds: . diltiazem  300 mg Oral Daily  . enoxaparin (LOVENOX) injection  40 mg Subcutaneous Q24H  . levothyroxine  50 mcg Oral QAC breakfast  . lipase/protease/amylase  72,000 Units Oral TID AC   Continuous Infusions:   LOS: 0 days   Time spent= 35 mins    Gared Gillie ChiArsenio LoaderD [facility]  If 7PM-7AM, please contact night-coverage  03/27/2020, 8:30 AM

## 2020-03-27 NOTE — Progress Notes (Signed)
ANTICOAGULATION CONSULT NOTE - Initial Consult  Pharmacy Consult for Eliquis Indication: Afib + new CVA  Allergies  Allergen Reactions  . Statins Other (See Comments)    Myalgias and memory problems  . Ciprofloxacin Itching    Splotchy redness with itching during IV infusion localized to arm.    Patient Measurements: Height: 5' 1"  (154.9 cm) Weight: 66 kg (145 lb 8.1 oz) IBW/kg (Calculated) : 47.8  Vital Signs: Temp: 98 F (36.7 C) (10/28 1145) Temp Source: Oral (10/28 1145) BP: 181/67 (10/28 1145) Pulse Rate: 85 (10/28 1145)  Labs: Recent Labs    03/26/20 1142 03/26/20 1145  HGB 13.5 13.9  HCT 40.2 41.0  PLT 300  --   APTT 34  --   LABPROT 12.7  --   INR 1.0  --   CREATININE 0.67 0.70    Estimated Creatinine Clearance: 50.4 mL/min (by C-G formula based on SCr of 0.7 mg/dL).   Medical History: Past Medical History:  Diagnosis Date  . Arthritis   . Asthma    allergy induced  . Bilateral carotid artery disease (HCC)    L carotid bruit  . Bursitis    left hip  . CAD (coronary artery disease)   . Cancer ([place])   . Dyslipidemia    intolerant to statins, welchol, niacin, zetia  . Goals of care, counseling/discussion 10/11/2017  . History of blood transfusion   . History of nuclear stress test 04/24/2012   lexiscan; normal study  . Hypertension   . Hypothyroidism   . 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 ([place]) 10/11/2017  . Postoperative nausea and vomiting 01/02/2016    Assessment: CC/HPI: Code Stroke. No TPA MRI showed acute left parietal and posterior insular infarct.  CTA head and neck showed left M3 MCA branch occlusion.  PMH: arthritis, asthma, BL carotid dz, bursitis of hip, CAD, cancer, CAD status post CABG, TIA, uterine cancer with colonic metastases status post colostomy, A. fib, HTN, HLD, hypothyroidism, known AF not on anticoag  Anticoag: New Eliquis for known afib + new CVA. Hgb 13.9. Scr<1  Goal of  Therapy:  Therapeutic oral anticoagulation  Plan:  D/c Lovenox Need LDL and A1C result Eliquis 80m BID   Dnyla Antonetti S. RAlford [place] PharmD, BCPS Clinical Staff Pharmacist [email or web address] RAlford Highland CThe Timken Company10/28/2021,3:35 PM

## 2020-03-27 NOTE — Progress Notes (Signed)
STROKE TEAM PROGRESS NOTE   INTERVAL HISTORY Her son is at the bedside. 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  I personally reviewed history of presenting illness with the patient and son, electronic medical records and imaging films in PACS.  She continues to have significant expressive and mild receptive aphasia but is able to communicate via occasional short sentences and writing Review of her chart shows that she had transient atrial fibrillation following preparation for colonoscopy procedure when she became sick and threw up.  She has now started on anticoagulation. Vitals:   03/26/20 2321 03/27/20 0318 03/27/20 0810 03/27/20 1145  BP: (!) 175/62 (!) 185/73 (!) 193/84 (!) 181/67  Pulse: 83 97 94 85  Resp: 18 18 18    Temp: 98.6 F (37 C) 98.2 F (36.8 C) 98.9 F (37.2 C) 98 F (36.7 C)  TempSrc:   Oral Oral  SpO2: 93% 95% 98% 97%  Weight:      Height:       CBC:  Recent Labs  Lab 03/26/20 1142 03/26/20 1145  WBC 5.9  --   NEUTROABS 5.0  --   HGB 13.5 13.9  HCT 40.2 41.0  MCV 90.5  --   PLT 300  --    Basic Metabolic Panel:  Recent Labs  Lab 03/26/20 1142 03/26/20 1145  NA 141 139  K 3.8 3.9  CL 104 103  CO2 27  --   GLUCOSE 119* 116*  BUN 15 14  CREATININE 0.67 0.70  CALCIUM 9.8  --    Lipid Panel: No results for input(s): CHOL, TRIG, HDL, CHOLHDL, VLDL, LDLCALC in the last 168 hours. HgbA1c: No results for input(s): HGBA1C in the last 168 hours. Urine Drug Screen: No results for input(s): LABOPIA, COCAINSCRNUR, LABBENZ, AMPHETMU, THCU, LABBARB in the last 168 hours.  Alcohol Level No results for input(s): ETH in the last 168 hours.  IMAGING past 24 hours CT Head Wo Contrast  Result Date: 03/26/2020 CLINICAL DATA:  Neuro deficit, acute, stroke suspected; aphasia, headache, right neck pain. EXAM: CT HEAD WITHOUT CONTRAST TECHNIQUE: Contiguous axial images were obtained from the base of the skull through the vertex without intravenous  contrast. COMPARISON:  Prior head CT 10/06/2010. FINDINGS: Brain: Mild generalized cerebral atrophy. Age-indeterminate lacunar infarct within the right basal ganglia (series 2, image 12) (series 5, image 32). Redemonstrated prominent perivascular space versus chronic lacunar infarct within the right subinsular white matter (series 2, image 11). There is no acute intracranial hemorrhage. No demarcated cortical infarct. No extra-axial fluid collection. No evidence of intracranial mass. No midline shift. Vascular: No hyperdense vessel.  Atherosclerotic calcifications. Skull: Normal. Negative for fracture or focal lesion. Sinuses/Orbits: Visualized orbits show no acute finding. No significant paranasal sinus disease or mastoid effusion at the imaged levels IMPRESSION: No evidence of acute intracranial hemorrhage or acute demarcated cortical infarction. Right basal ganglia lacunar infarct, new as compared to the head CT of 10/06/2010, but otherwise age indeterminate. Redemonstrated prominent perivascular space versus chronic lacunar infarct within the right subinsular white matter. Mild cerebral atrophy. Electronically Signed   By: Kellie Simmering DO   On: 03/26/2020 12:19   MR BRAIN WO CONTRAST  Result Date: 03/26/2020 CLINICAL DATA:  Neuro deficit, acute stroke suspected. EXAM: MRI HEAD WITHOUT CONTRAST TECHNIQUE: Multiplanar, multiecho pulse sequences of the brain and surrounding structures were obtained without intravenous contrast. COMPARISON:  Same day head CT. FINDINGS: Brain: There is restricted diffusion involving the inferior left parietal lobe and  posterior aspect of the insula. There is mild associated T2/FLAIR hyperintensity without substantial mass effect. Additional scattered T2/FLAIR hyperintensities in the white matter, compatible with chronic microvascular ischemic disease. Mild cerebral volume loss. No hydrocephalus. No acute hemorrhage. No mass lesion or abnormal mass effect. Remote lacunar infarcts  in the right basal ganglia. Vascular: Major arterial flow voids are maintained at the skull base. Skull and upper cervical spine: Normal marrow signal. Sinuses/Orbits: Sinuses are clear.  Unremarkable orbits. Other: No mastoid effusions. IMPRESSION: 1. Acute infarct in the inferior left parietal lobe and posterior insula. Mild edema without substantial mass effect. 2. Chronic microvascular ischemic disease and remote lacunar infarcts in the right basal ganglia. Electronically Signed   By: Margaretha Sheffield MD   On: 03/26/2020 13:27   CT CEREBRAL PERFUSION W CONTRAST  Result Date: 03/26/2020 CLINICAL DATA:  Left parietal stroke, episodes of difficulty paraphrasing EXAM: CT ANGIOGRAPHY HEAD AND NECK CT PERFUSION BRAIN TECHNIQUE: Multidetector CT imaging of the head and neck was performed using the standard protocol during bolus administration of intravenous contrast. Multiplanar CT image reconstructions and MIPs were obtained to evaluate the vascular anatomy. Carotid stenosis measurements (when applicable) are obtained utilizing NASCET criteria, using the distal internal carotid diameter as the denominator. Multiphase CT imaging of the brain was performed following IV bolus contrast injection. Subsequent parametric perfusion maps were calculated using RAPID software. CONTRAST:  140m OMNIPAQUE IOHEXOL 350 MG/ML SOLN COMPARISON:  Correlation made with MRI earlier same day FINDINGS: CTA NECK FINDINGS Aortic arch: Calcified plaque along the aortic arch and great vessel origins, which are patent. Right carotid system: Patent. Diffuse atherosclerotic irregularity of the common carotid. Mixed plaque at the bifurcation and proximal internal carotid causing less than 50% stenosis. Left carotid system: Patent. Diffuse atherosclerotic irregularity of the common carotid. Mixed plaque at the bifurcation and ICA origin causing less than 50% stenosis. Noncalcified plaque appears irregular. Vertebral arteries: Patent and  codominant. Plaque at the origins without high-grade stenosis. Skeleton: Degenerative changes of the cervical spine. Other neck: No mass or adenopathy. Upper chest: Right chest wall port catheter enters the SVC. No apical lung mass. Review of the MIP images confirms the above findings CTA HEAD FINDINGS Anterior circulation: Intracranial internal carotid arteries are patent with calcified plaque causing mild stenosis of the right and mild to moderate stenosis on the left. Right middle and both anterior cerebral arteries are patent. Left M1 MCA is patent. No proximal left M2 MCA occlusion identified. Left M2 MCA branch atherosclerotic irregularity and a possible M3 branch occlusion. However, evaluation is suboptimal due to presence of significant venous enhancement. Posterior circulation: Intracranial vertebral arteries are patent with atherosclerotic irregularity and mild stenosis. Basilar artery is patent. Posterior cerebral arteries are patent with atherosclerotic irregularity and areas of mild to moderate stenosis. Venous sinuses: As permitted by contrast timing, patent. Review of the MIP images confirms the above findings CT Brain Perfusion Findings: CBF (<30%) Volume: 026mPerfusion (Tmax>6.0s) volume: 2056mismatch Volume: 18m74mfarction Location: Territory at risk is posterior to the area of infarction on MRI. IMPRESSION: Perfusion imaging demonstrates does not show infarct from MRI likely due to timing. There is no new core infarction but 20 mL of territory at risk is identified posterior to the area of restricted diffusion. There is no left M1 or proximal M2 MCA occlusion. Atherosclerotic irregularity of left M2 MCA branches and possible M3 MCA branch occlusion. Suboptimal evaluation of distal branches due to venous enhancement. Plaque at the ICA origins causing less than  50% stenosis. Noncalcified plaque at the left ICA origin as an irregular appearance. Electronically Signed   By: Macy Mis M.D.   On:  03/26/2020 15:57   CT ANGIO HEAD CODE STROKE  Result Date: 03/26/2020 CLINICAL DATA:  Left parietal stroke, episodes of difficulty paraphrasing EXAM: CT ANGIOGRAPHY HEAD AND NECK CT PERFUSION BRAIN TECHNIQUE: Multidetector CT imaging of the head and neck was performed using the standard protocol during bolus administration of intravenous contrast. Multiplanar CT image reconstructions and MIPs were obtained to evaluate the vascular anatomy. Carotid stenosis measurements (when applicable) are obtained utilizing NASCET criteria, using the distal internal carotid diameter as the denominator. Multiphase CT imaging of the brain was performed following IV bolus contrast injection. Subsequent parametric perfusion maps were calculated using RAPID software. CONTRAST:  138m OMNIPAQUE IOHEXOL 350 MG/ML SOLN COMPARISON:  Correlation made with MRI earlier same day FINDINGS: CTA NECK FINDINGS Aortic arch: Calcified plaque along the aortic arch and great vessel origins, which are patent. Right carotid system: Patent. Diffuse atherosclerotic irregularity of the common carotid. Mixed plaque at the bifurcation and proximal internal carotid causing less than 50% stenosis. Left carotid system: Patent. Diffuse atherosclerotic irregularity of the common carotid. Mixed plaque at the bifurcation and ICA origin causing less than 50% stenosis. Noncalcified plaque appears irregular. Vertebral arteries: Patent and codominant. Plaque at the origins without high-grade stenosis. Skeleton: Degenerative changes of the cervical spine. Other neck: No mass or adenopathy. Upper chest: Right chest wall port catheter enters the SVC. No apical lung mass. Review of the MIP images confirms the above findings CTA HEAD FINDINGS Anterior circulation: Intracranial internal carotid arteries are patent with calcified plaque causing mild stenosis of the right and mild to moderate stenosis on the left. Right middle and both anterior cerebral arteries are  patent. Left M1 MCA is patent. No proximal left M2 MCA occlusion identified. Left M2 MCA branch atherosclerotic irregularity and a possible M3 branch occlusion. However, evaluation is suboptimal due to presence of significant venous enhancement. Posterior circulation: Intracranial vertebral arteries are patent with atherosclerotic irregularity and mild stenosis. Basilar artery is patent. Posterior cerebral arteries are patent with atherosclerotic irregularity and areas of mild to moderate stenosis. Venous sinuses: As permitted by contrast timing, patent. Review of the MIP images confirms the above findings CT Brain Perfusion Findings: CBF (<30%) Volume: 041mPerfusion (Tmax>6.0s) volume: 204mismatch Volume: 67m36mfarction Location: Territory at risk is posterior to the area of infarction on MRI. IMPRESSION: Perfusion imaging demonstrates does not show infarct from MRI likely due to timing. There is no new core infarction but 20 mL of territory at risk is identified posterior to the area of restricted diffusion. There is no left M1 or proximal M2 MCA occlusion. Atherosclerotic irregularity of left M2 MCA branches and possible M3 MCA branch occlusion. Suboptimal evaluation of distal branches due to venous enhancement. Plaque at the ICA origins causing less than 50% stenosis. Noncalcified plaque at the left ICA origin as an irregular appearance. Electronically Signed   By: PranMacy Mis.   On: 03/26/2020 15:57   CT ANGIO NECK CODE STROKE  Result Date: 03/26/2020 CLINICAL DATA:  Left parietal stroke, episodes of difficulty paraphrasing EXAM: CT ANGIOGRAPHY HEAD AND NECK CT PERFUSION BRAIN TECHNIQUE: Multidetector CT imaging of the head and neck was performed using the standard protocol during bolus administration of intravenous contrast. Multiplanar CT image reconstructions and MIPs were obtained to evaluate the vascular anatomy. Carotid stenosis measurements (when applicable) are obtained utilizing NASCET  criteria, using  the distal internal carotid diameter as the denominator. Multiphase CT imaging of the brain was performed following IV bolus contrast injection. Subsequent parametric perfusion maps were calculated using RAPID software. CONTRAST:  174m OMNIPAQUE IOHEXOL 350 MG/ML SOLN COMPARISON:  Correlation made with MRI earlier same day FINDINGS: CTA NECK FINDINGS Aortic arch: Calcified plaque along the aortic arch and great vessel origins, which are patent. Right carotid system: Patent. Diffuse atherosclerotic irregularity of the common carotid. Mixed plaque at the bifurcation and proximal internal carotid causing less than 50% stenosis. Left carotid system: Patent. Diffuse atherosclerotic irregularity of the common carotid. Mixed plaque at the bifurcation and ICA origin causing less than 50% stenosis. Noncalcified plaque appears irregular. Vertebral arteries: Patent and codominant. Plaque at the origins without high-grade stenosis. Skeleton: Degenerative changes of the cervical spine. Other neck: No mass or adenopathy. Upper chest: Right chest wall port catheter enters the SVC. No apical lung mass. Review of the MIP images confirms the above findings CTA HEAD FINDINGS Anterior circulation: Intracranial internal carotid arteries are patent with calcified plaque causing mild stenosis of the right and mild to moderate stenosis on the left. Right middle and both anterior cerebral arteries are patent. Left M1 MCA is patent. No proximal left M2 MCA occlusion identified. Left M2 MCA branch atherosclerotic irregularity and a possible M3 branch occlusion. However, evaluation is suboptimal due to presence of significant venous enhancement. Posterior circulation: Intracranial vertebral arteries are patent with atherosclerotic irregularity and mild stenosis. Basilar artery is patent. Posterior cerebral arteries are patent with atherosclerotic irregularity and areas of mild to moderate stenosis. Venous sinuses: As permitted  by contrast timing, patent. Review of the MIP images confirms the above findings CT Brain Perfusion Findings: CBF (<30%) Volume: 034mPerfusion (Tmax>6.0s) volume: 2065mismatch Volume: 18m35mfarction Location: Territory at risk is posterior to the area of infarction on MRI. IMPRESSION: Perfusion imaging demonstrates does not show infarct from MRI likely due to timing. There is no new core infarction but 20 mL of territory at risk is identified posterior to the area of restricted diffusion. There is no left M1 or proximal M2 MCA occlusion. Atherosclerotic irregularity of left M2 MCA branches and possible M3 MCA branch occlusion. Suboptimal evaluation of distal branches due to venous enhancement. Plaque at the ICA origins causing less than 50% stenosis. Noncalcified plaque at the left ICA origin as an irregular appearance. Electronically Signed   By: PranMacy Mis.   On: 03/26/2020 15:57    PHYSICAL EXAM Pleasant frail elderly Caucasian lady not in distress. . Afebrile. Head is nontraumatic. Neck is supple without bruit.    Cardiac exam no murmur or gallop. Lungs are clear to auscultation. Distal pulses are well felt. Neurological Exam : She is awake alert oriented to person and place.  She has moderate expressive and mild receptive aphasia is able to speak occasional short sentences and few words.  She has significant word hesitancy and occasional paraphasic errors.  She is unable to repeat.  Comprehension slightly impaired.  She follows simple midline and one-step commands.  Extraocular movements are full range without nystagmus.  Blinks to threat bilaterally.  Right lower facial weakness.  Tongue midline.  Motor system exam shows symmetric upper and lower extremity strength without focal weakness.  Sensation appears intact bilaterally.  Gait not tested. ASSESSMENT/PLAN Ms. EditNICK ARMELa 79 y55. female with history of  asthma, CAD, HLD, afib, HTN, Uterina Ca locally advanced [facility]  [facility] and neck pain. Had some worsening following admission, IR considered, but not felt to be a candidate.  Stroke:   L MCA infarct embolic secondary to known AF not on AC  CT head No acute abnormality. R basal ganglia lacune new from 09/2010. R subinsular white matter infarct. Atrophy.   MRI  Inferior L parietal lobe and posterior insular infarct. Small vessel disease. Chronic R basal ganglia lacune.  CTA head & neck no new infarct. L M2 and possible M3 branch irregularilty. ICA < 50% plaque. Irregular plaque origin L ICA.  CT perfusion no core, 49m penumbra.  2D Echo pending   LDL pending    HgbA1c pending    VTE prophylaxis - Lovenox 40 mg sq daily   No antithrombotic prior to admission, received aspirin 81 10/27, now on no antithrombotic. Add eliquis for secondary stroke prevention  Therapy recommendations:  Supervision, no PT or OT, needs SLP  Disposition:  pending   Atrial Fibrillation  Home anticoagulation:  none  . Had AF in 2019 d/t stress from coloscopy prep. [facility], will add AC at this time . Continue Eliquis (apixaban) daily at discharge   Hypertension  On the high side 210/76 day of admission   Stable pm the high side . Permissive hypertension (OK if < 220/120) but gradually normalize in 5-7 days . Long-term BP goal normotensive  Hyperlipidemia  Home meds:  No statin  Intolerant to statins w/ myalgias and memory loss  Cardiology following for PCS-K   LDL pending, goal < 70  Other Stroke Risk Factors  Advanced age  Coronary artery disease  Other Active Problems  Uterine cancer w/ colonic mets s/p colostomy, no recurrence per CT 03/17/20  Hospital day # 0 She has presented with embolic left MCA branch infarct with distal left M2/M3 occlusion which was too distal for mechanical thrombectomy.  Continue ongoing speech therapy she will need long-term  anticoagulation with Eliquis upon discharge given her prior history of paroxysmal A. fib in 2019.  Long discussion with patient and son at the bedside and answered questions.  Continue ongoing stroke work-up.  Greater than 50% time during this 35-minute visit was spent on counseling and coordination of care about her embolic stroke and remote history of A. fib and need for long-term anticoagulation and answering questions. PAntony Contras MD To contact Stroke Continuity provider, please refer to [email or web address] After hours, contact General Neurology

## 2020-03-27 NOTE — Progress Notes (Signed)
OT Cancellation Note  Patient Details Name: Shannon Scott MRN: [id number] DOB: 05/22/1941   Cancelled Treatment:    Reason Eval/Treat Not Completed: Pain limiting ability to participate;Other (comment) (headache). Pt with 8/10 FACES headache (points to forehead). Pt reports recently receiving pain med but does not feel it has taken full effect yet. Plan to reattempt in a bit.  Tyrone Schimke, OT Acute Rehabilitation Services Pager: [phone] Office: [phone]  03/27/2020, 9:19 AM

## 2020-03-27 NOTE — Evaluation (Signed)
Physical Therapy Evaluation Patient Details Name: Shannon Scott MRN: [id number] DOB: 1940-11-28 Today's Date: 03/27/2020   History of Present Illness  79 y.o. female with PMH significant for asthma, CAD, HLD, afibb, HTN, Uterina Ca locally advanced who presents with concern for expressive aphasia, R sided headache and neck pain. She was noted to have word finding difficulty.  Clinical Impression  Pt presents to PT with deficits in cognition, communication, strength, and safety awareness. Per discussion with pt and the pt's son, the pt's mobility and ambulation quality appears very close to baseline. Pt does demonstrate impairments in cognition as well as significant communication deficits. Pt with poor safety awareness, asking if she can go to the bathroom independently 4-5 times during session despite PT instructing her to have staff assistance each time. Pt will benefit from continued acute PT POC to improve dynamic gait and balance and to improve safety awareness. PT recommends no PT needs at the time of discharge, 24/7 assistance from family for safety.    Follow Up Recommendations No PT follow up;Supervision/Assistance - 24 hour    Equipment Recommendations  None recommended by PT    Recommendations for Other Services       Precautions / Restrictions Precautions Precautions: Fall Restrictions Weight Bearing Restrictions: No      Mobility  Bed Mobility Overal bed mobility: Needs Assistance Bed Mobility: Supine to Sit     Supine to sit: Supervision Sit to supine: Min guard   General bed mobility comments: cues for initiation vs sequencing? Min guard for safety    Transfers Overall transfer level: Needs assistance Equipment used: Rolling walker (2 wheeled);None Transfers: Sit to/from Stand Sit to Stand: Supervision         General transfer comment: min guard for safety  Ambulation/Gait Ambulation/Gait assistance: Supervision Gait Distance (Feet): 15  Feet Assistive device: Rolling walker (2 wheeled);None Gait Pattern/deviations: Step-to pattern Gait velocity: reduced Gait velocity interpretation: <1.8 ft/sec, indicate of risk for recurrent falls General Gait Details: pt with short step to pattern, utilizes RW for ~10' and no device for ~15' ambulation in room, no significant balance deviations noted  Stairs            Wheelchair Mobility    Modified Rankin (Stroke Patients Only) Modified Rankin (Stroke Patients Only) Pre-Morbid Rankin Score: Slight disability Modified Rankin: Moderately severe disability     Balance Overall balance assessment: Needs assistance Sitting-balance support: No upper extremity supported;Feet supported Sitting balance-Leahy Scale: Good     Standing balance support: No upper extremity supported;During functional activity Standing balance-Leahy Scale: Good Standing balance comment: pt able to hold SLS for 5 seconds, pick up object from floor, take medicines in standing                             Pertinent Vitals/Pain Pain Assessment: Faces Faces Pain Scale: Hurts a little bit Pain Location: generalized Pain Descriptors / Indicators: Grimacing Pain Intervention(s): Monitored during session    Home Living Family/patient expects to be discharged to:: Private residence Living Arrangements: Alone Available Help at Discharge: Family;Available 24 hours/day Type of Home: House Home Access: Stairs to enter Entrance Stairs-Rails: None Entrance Stairs-Number of Steps: 2 Home Layout: One level Home Equipment: Cane - single point (may have RW and shower seat per OT eval)      Prior Function Level of Independence: Needs assistance   Gait / Transfers Assistance Needed: pt ambulates independently in the home with PRN use  of cane. Pt uses cane for all outdoor mobility and always has assistance for stair negotiation  ADL's / Homemaking Assistance Needed: pt performs ADLs independently,  children deliver groceries and do all driving  Comments: does not drive, household ambulator.      Hand Dominance        Extremity/Trunk Assessment   Upper Extremity Assessment Upper Extremity Assessment: Defer to OT evaluation    Lower Extremity Assessment Lower Extremity Assessment: Generalized weakness    Cervical / Trunk Assessment Cervical / Trunk Assessment: Kyphotic  Communication   Communication: Receptive difficulties;Expressive difficulties  Cognition Arousal/Alertness: Awake/alert Behavior During Therapy: Impulsive Overall Cognitive Status: Difficult to assess                                 General Comments: pt follows one step commands well during session. Pt demonstrates poor awareness of deficits. Pt is disoriented to time and somewhat to situation. Pt with poor short term memory, asking PT if she can walk to the bathroom by herself 4-5 times during session.      General Comments General comments (skin integrity, edema, etc.): VSS on RA    Exercises     Assessment/Plan    PT Assessment Patient needs continued PT services  PT Problem List Decreased strength;Decreased activity tolerance;Decreased balance;Decreased cognition;Decreased mobility;Decreased safety awareness       PT Treatment Interventions DME instruction;Gait training;Stair training;Functional mobility training;Balance training;Neuromuscular re-education;Patient/family education;Cognitive remediation    PT Goals (Current goals can be found in the Care Plan section)  Acute Rehab PT Goals Patient Stated Goal: to return to independence PT Goal Formulation: With patient/family Time For Goal Achievement: 04/10/20 Potential to Achieve Goals: Good    Frequency Min 4X/week   Barriers to discharge        Co-evaluation               AM-PAC PT "6 Clicks" Mobility  Outcome Measure Help needed turning from your back to your side while in a flat bed without using  bedrails?: None Help needed moving from lying on your back to sitting on the side of a flat bed without using bedrails?: None Help needed moving to and from a bed to a chair (including a wheelchair)?: None Help needed standing up from a chair using your arms (e.g., wheelchair or bedside chair)?: None Help needed to walk in hospital room?: None Help needed climbing 3-5 steps with a railing? : A Little 6 Click Score: 23    End of Session   Activity Tolerance: Patient tolerated treatment well Patient left: in chair;with call bell/phone within reach;with chair alarm set Nurse Communication: Mobility status PT Visit Diagnosis: Other abnormalities of gait and mobility (R26.89)    Time: 4158-3094 PT Time Calculation (min) (ACUTE ONLY): 23 min   Charges:   PT Evaluation $PT Eval Moderate Complexity: 1 Mod          Zenaida Niece, PT, DPT Acute Rehabilitation Pager: [phone]   Zenaida Niece 03/27/2020, 12:58 PM

## 2020-03-27 NOTE — Evaluation (Signed)
Speech Language Pathology Evaluation Patient Details Name: Shannon Scott MRN: [id number] DOB: 1941/04/22 Today's Date: 03/27/2020 Time: 8299-3716 SLP Time Calculation (min) (ACUTE ONLY): 44 min  Problem List:  Patient Active Problem List   Diagnosis Date Noted  . Stroke ([place]) 03/26/2020  . Expressive aphasia 03/26/2020  . Acute vaginitis 01/03/2020  . Acute vulvitis 01/03/2020  . Lower extremity edema 11/09/2018  . Back pain 11/09/2018  . IDA (iron deficiency anemia) 10/21/2017  . Abdominal mass   . Deep vein thrombosis (DVT) of left lower extremity ([place])   . Pelvic mass   . Acute blood loss anemia   . 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 ([place]) 10/11/2017  . Goals of care, counseling/discussion 10/11/2017  . Rectal bleeding 10/03/2017  . Normocytic anemia 10/03/2017  . Left hip pain 10/03/2017  . Generalized weakness 10/03/2017  . Colostomy status ([place]) 08/30/2017  . Ureteral stent displacement, subsequent encounter 08/30/2017  . Atrial fibrillation with RVR ([place]) 08/30/2017  . Hypophosphatemia 08/30/2017  . Hypomagnesemia 08/30/2017  . Acute blood loss as cause of postoperative anemia 08/30/2017  . Chronic anemia 08/30/2017  . Left leg swelling 08/30/2017  . GERD (gastroesophageal reflux disease) 08/30/2017  . Atrial fibrillation, chronic ([place])   . Bowel perforation ([place]) 08/18/2017  . Colonic mass 08/18/2017  . Ureteral dilatation 08/18/2017  . Intractable right heel pain 10/07/2016  . Plantar fasciitis of right foot 10/07/2016  . Postoperative nausea and vomiting 01/02/2016  . Hyponatremia 01/01/2016  . Acute hypokalemia 01/01/2016  . Endometrial cancer ([place]) 12/30/2015  . Familial hypercholesteremia 07/28/2015  . Aortic valve stenosis 08/02/2014  . S/P CABG x 3 01/13/2013  . Palpitations 01/13/2013  . Medication intolerance 01/13/2013  . Chronic pain 01/13/2013  . TIA (transient ischemic attack) 01/13/2013  .  Carotid stenosis 01/13/2013  . Hypothyroid 10/05/2010  . Essential hypertension 10/05/2010  . Gastroesophagitis 10/05/2010  . Atrophic gastritis 10/05/2010   Past Medical History:  Past Medical History:  Diagnosis Date  . Arthritis   . Asthma    allergy induced  . Bilateral carotid artery disease (HCC)    L carotid bruit  . Bursitis    left hip  . CAD (coronary artery disease)   . Cancer ([place])   . Dyslipidemia    intolerant to statins, welchol, niacin, zetia  . Goals of care, counseling/discussion 10/11/2017  . History of blood transfusion   . History of nuclear stress test 04/24/2012   lexiscan; normal study  . Hypertension   . Hypothyroidism   . 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 ([place]) 10/11/2017  . Postoperative nausea and vomiting 01/02/2016   Past Surgical History:  Past Surgical History:  Procedure Laterality Date  . ABDOMINAL HYSTERECTOMY    . Carotid Doppler  02/2012   40-59% right int carotid artery stenosis; 60-79% L int carotid stenosis; L carotid bruit  . CORONARY ARTERY BYPASS GRAFT  03/12/2004   LIMA to LAD, SVG to circumflex, SVG to PDA  . CYSTOSCOPY W/ URETERAL STENT PLACEMENT Left 12/06/2017   Procedure: CYSTOSCOPY WITH LEFT URETERAL STENT EXCHANGE;  Surgeon: Irine Seal, MD;  Location: [facility];  Service: Urology;  Laterality: Left;  . CYSTOSCOPY WITH STENT PLACEMENT Bilateral 08/21/2017   Procedure: CYSTOSCOPY WITH STENT PLACEMENT;  Surgeon: Ceasar Mons, MD;  Location: [facility];  Service: Urology;  Laterality: Bilateral;  . IR FLUORO GUIDE PORT INSERTION RIGHT  10/11/2017  . IR GENERIC HISTORICAL  04/29/2016   IR  RADIOLOGIST EVAL & MGMT 04/29/2016 Sandi Mariscal, MD [facility] INTERV RAD  . IR GENERIC HISTORICAL  05/12/2016   IR RADIOLOGIST EVAL & MGMT 05/12/2016 Sandi Mariscal, MD [facility] INTERV RAD  . IR IVC FILTER PLMT / S&I /IMG GUID/MOD SED  10/11/2017  . IR US GUIDE VASC ACCESS RIGHT  10/11/2017  . ROBOTIC  ASSISTED TOTAL HYSTERECTOMY WITH BILATERAL SALPINGO OOPHERECTOMY Bilateral 12/30/2015   Procedure: XI ROBOTIC ASSISTED TOTAL HYSTERECTOMY WITH BILATERAL SALPINGO OOPHORECTOMY WITH SENTAL LYMPH NODE BIOPSY;  Surgeon: Nancy Marus, MD;  Location: [facility];  Service: Gynecology;  Laterality: Bilateral;  . TONSILLECTOMY    . TRANSTHORACIC ECHOCARDIOGRAM  04/2007   EF>55%; mild MR; mild-mod TR; mild pulm HTN; mild calcification of aortiv valve leaflets with mild valvular aortic stenosis  . TUBAL LIGATION     HPI:  79 year old with history of CAD status post CABG, TIA, uterine cancer with colonic metastases status post colostomy, A. fib, HTN, HLD, hypothyroidism admitted for slurred speech.  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  CTA head and neck showed left M3 MCA branch occlusion.  L parietal  lobe and posterior insula impacted.   Assessment / Plan / Recommendation Clinical Impression  Pt presenst with moderate-severe mixed expressive and receptive language deficits and mild-moderate verbal apraxia. Pt was assessed using the Western Aphasia Battery - Bedside Form (see below for additional information).    During OME, pt was noted to have difficulty with executing directions with groping and inconsistent oral movements.  Pt exhibited apraxic errors in speech when asked to give her address.  Errors were inconsistent with repeated attempts despite cues from family. Pt's speech appears to be free from dysarthria.  Speech sounds were clear, even when incorrect.  Pt's spontaneous speech was intelligible.  Receptive language deficits noted with naming and repetition.  Spontaneous output is very difficult to understand in unknown context.  Picture description task was marked by short but complete sentences with paraphasias and hesitations noted. Pt exhibited significant difficulty with confrontational naming.  Perseveration, paraphasias, and circumlocution noted. She is able to compensate  for errors with multiple strategies including nonverbal communicaiton and use of circumlocution to describe target.  For example, she pantomimed use of key and stated "it unlocks" even though she could not produce the word key despite max cuing.  She occasionally benefitted from phonemic cuing.  Pt benefits from use of writing to improve communication as well.  Pt wrote name and address with no errors and relative ease, despite struggling with these same items verbally.  Pt was unable to complete repetition task, and appeared to struggle with understanding directions.    Receptive language impairments were seen with yes/no questions and command following.  Son notes concern for hearing loss PTA, with plans for audiologic assessment, which may be impacting receptive language deficits.  With written support, pt improved in command following, but was only able fully execute one additional item.    Pt is intermittently aware of errors as they occur and was noted to self-correct during picture description task.  Pt is aware of big picture deficits generally and says she is in the hospital because she is having trouble with her words.  SLP will initiate therapy to address above noted deficits.  Despite severity of deficits, pt's use of communication strategies and multimodal communication, as well as recency of event and prior level of function indicated a good prognosis for recovery.  Pt will need continued ST at next level of care.  Consider IPR if appropriate.  WAB- bedside Speech Content: 7/10 Speech Fluency: 5/10 Yes/No: 3/10 Sequential Commands: 1/10 Repetition: 0/10 Object Naming: 1.5/10 Bedside Aphasia Score 29/100  Reading: Informally assessed Writing: 3/10     SLP Assessment  SLP Recommendation/Assessment: Patient needs continued Speech Lanaguage Pathology Services SLP Visit Diagnosis: Aphasia (R47.01);Apraxia (R48.2)    Follow Up Recommendations  Inpatient Rehab;Outpatient SLP;Home  health SLP;Skilled Nursing facility (Continue ST at next level of care)    Frequency and Duration min 2x/week  2 weeks      SLP Evaluation Cognition  Overall Cognitive Status: Difficult to assess       Comprehension  Auditory Comprehension Overall Auditory Comprehension: Impaired Yes/No Questions: Impaired Basic Biographical Questions: 26-50% accurate Basic Immediate Environment Questions: 25-49% accurate Complex Questions: 0-24% accurate Commands: Impaired Two Step Basic Commands: 25-49% accurate Multistep Basic Commands: 0-24% accurate Conversation: Simple Interfering Components: Hearing EffectiveTechniques: Repetition;Slowed speech;Visual/Gestural cues;Increased volume Reading Comprehension Reading Status: Impaired Sentence Level: Impaired    Expression Expression Primary Mode of Expression: Verbal Verbal Expression Overall Verbal Expression: Impaired Level of Generative/Spontaneous Verbalization: Word;Phrase Repetition: Impaired Level of Impairment: Word level Naming: Impairment Confrontation: Impaired Verbal Errors: Perseveration Effective Techniques: Written cues;Phonemic cues   Oral / Motor  Oral Motor/Sensory Function Overall Oral Motor/Sensory Function: Moderate impairment Facial ROM: Reduced right;Reduced left Lingual ROM: Reduced right;Reduced left Lingual Symmetry: Within Functional Limits Lingual Strength: Within Functional Limits Velum: Within Functional Limits Mandible: Within Functional Limits Motor Speech Overall Motor Speech: Appears within functional limits for tasks assessed Respiration: Within functional limits Phonation: Normal Resonance: Within functional limits Articulation: Within functional limitis Intelligibility: Intelligible Motor Planning: Impaired Level of Impairment: Word Motor Speech Errors: Aware;Groping for words;Inconsistent   GO                    Celedonio Savage, [place], [place] Office:  [phone]  03/27/2020, 12:23 PM

## 2020-03-27 NOTE — Progress Notes (Signed)
  Echocardiogram 2D Echocardiogram has been performed.  Shannon Scott 03/27/2020, 10:21 AM

## 2020-03-27 NOTE — Evaluation (Signed)
Clinical/Bedside Swallow Evaluation Patient Details  Name: Shannon Scott MRN: [id number] Date of Birth: 02-12-1941  Today's Date: 03/27/2020 Time: SLP Start Time (ACUTE ONLY): 1058 SLP Stop Time (ACUTE ONLY): 1106 SLP Time Calculation (min) (ACUTE ONLY): 8 min  Past Medical History:  Past Medical History:  Diagnosis Date  . Arthritis   . Asthma    allergy induced  . Bilateral carotid artery disease (HCC)    L carotid bruit  . Bursitis    left hip  . CAD (coronary artery disease)   . Cancer ([place])   . Dyslipidemia    intolerant to statins, welchol, niacin, zetia  . Goals of care, counseling/discussion 10/11/2017  . History of blood transfusion   . History of nuclear stress test 04/24/2012   lexiscan; normal study  . Hypertension   . Hypothyroidism   . 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 ([place]) 10/11/2017  . Postoperative nausea and vomiting 01/02/2016   Past Surgical History:  Past Surgical History:  Procedure Laterality Date  . ABDOMINAL HYSTERECTOMY    . Carotid Doppler  02/2012   40-59% right int carotid artery stenosis; 60-79% L int carotid stenosis; L carotid bruit  . CORONARY ARTERY BYPASS GRAFT  03/12/2004   LIMA to LAD, SVG to circumflex, SVG to PDA  . CYSTOSCOPY W/ URETERAL STENT PLACEMENT Left 12/06/2017   Procedure: CYSTOSCOPY WITH LEFT URETERAL STENT EXCHANGE;  Surgeon: Irine Seal, MD;  [facility];  Service: Urology;  Laterality: Left;  . CYSTOSCOPY WITH STENT PLACEMENT Bilateral 08/21/2017   Procedure: CYSTOSCOPY WITH STENT PLACEMENT;  Surgeon: Ceasar Mons, MD;  [facility];  Service: Urology;  Laterality: Bilateral;  . IR FLUORO GUIDE PORT INSERTION RIGHT  10/11/2017  . IR GENERIC HISTORICAL  04/29/2016   IR RADIOLOGIST EVAL & MGMT 04/29/2016 Sandi Mariscal, MD [facility] INTERV RAD  . IR GENERIC HISTORICAL  05/12/2016   IR RADIOLOGIST EVAL & MGMT 05/12/2016 Sandi Mariscal, MD [facility] INTERV RAD  . IR IVC  FILTER PLMT / S&I /IMG GUID/MOD SED  10/11/2017  . IR US GUIDE VASC ACCESS RIGHT  10/11/2017  . ROBOTIC ASSISTED TOTAL HYSTERECTOMY WITH BILATERAL SALPINGO OOPHERECTOMY Bilateral 12/30/2015   Procedure: XI ROBOTIC ASSISTED TOTAL HYSTERECTOMY WITH BILATERAL SALPINGO OOPHORECTOMY WITH SENTAL LYMPH NODE BIOPSY;  Surgeon: Nancy Marus, MD;  Location: [facility];  Service: Gynecology;  Laterality: Bilateral;  . TONSILLECTOMY    . TRANSTHORACIC ECHOCARDIOGRAM  04/2007   EF>55%; mild MR; mild-mod TR; mild pulm HTN; mild calcification of aortiv valve leaflets with mild valvular aortic stenosis  . TUBAL LIGATION     HPI:  79 year old with history of CAD status post CABG, TIA, uterine cancer with colonic metastases status post colostomy, A. fib, HTN, HLD, hypothyroidism admitted for slurred speech.  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  CTA head and neck showed left M3 MCA branch occlusion.  L parietal  lobe and posterior insula impacted.   Assessment / Plan / Recommendation Clinical Impression  Pt presens with functional swallowing as assessed clinically.  Pt tolerated all consistencies trialed with no clinical s/s of aspiration and exhibited good oral clearance of solids.    Recommend regular texture diet with thin liquids.    Pt has no further ST needs to address dysphagia; SLP will sign off for swallowing  SLP Visit Diagnosis: Dysphagia, unspecified (R13.10)    Aspiration Risk  No limitations    Diet Recommendation Regular;Thin liquid   Liquid Administration via: Cup;Straw  Medication Administration: Whole meds with liquid Supervision: Patient able to self feed;Staff to assist with self feeding Compensations: Slow rate;Small sips/bites Postural Changes: Seated upright at 90 degrees    Other  Recommendations Oral Care Recommendations: Oral care BID   Follow up Recommendations Inpatient Rehab;Outpatient SLP;Home health SLP;Skilled Nursing facility (Continue ST at next level  of care)      Frequency and Duration N/A         Prognosis   N/A     Swallow Study   General HPI: 79 year old with history of CAD status post CABG, TIA, uterine cancer with colonic metastases status post colostomy, A. fib, HTN, HLD, hypothyroidism admitted for slurred speech.  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  CTA head and neck showed left M3 MCA branch occlusion.  L parietal  lobe and posterior insula impacted. Type of Study: Bedside Swallow Evaluation Previous Swallow Assessment: None Diet Prior to this Study: NPO Temperature Spikes Noted: No History of Recent Intubation: No Behavior/Cognition: Alert;Cooperative;Pleasant mood Oral Cavity Assessment: Within Functional Limits Oral Care Completed by SLP: No Oral Cavity - Dentition: Adequate natural dentition Vision: Functional for self-feeding Self-Feeding Abilities: Able to feed self Patient Positioning: Upright in bed Baseline Vocal Quality: Normal Volitional Cough: Weak Volitional Swallow: Able to elicit    Oral/Motor/Sensory Function Overall Oral Motor/Sensory Function: Moderate impairment Facial ROM: Reduced right;Reduced left Lingual ROM: Reduced right;Reduced left Lingual Symmetry: Within Functional Limits Lingual Strength: Within Functional Limits Velum: Within Functional Limits Mandible: Within Functional Limits   Ice Chips     Thin Liquid Thin Liquid: Within functional limits Presentation: Cup;Straw    Nectar Thick Nectar Thick Liquid: Not tested   Honey Thick Honey Thick Liquid: Not tested   Puree Puree: Within functional limits Presentation: Spoon   Solid     Solid: Within functional limits Presentation: [place], [place], [place] Office: [phone]  03/27/2020,12:38 PM

## 2020-03-27 NOTE — Evaluation (Signed)
Occupational Therapy Evaluation Patient Details Name: Shannon Scott MRN: [id number] DOB: 02-27-1941 Today's Date: 03/27/2020    History of Present Illness 79 year old female with past medical history of CAD s/p CABG, TIA, uterine cancer with colonic metastasis s/p colostomy, atrial fibrillation, hypertension, hyperlipidemia, hypothyroidism, carotid artery disease who was brought in by her son for speech changes. MRI revealed an acute left parietal lobe and posterior insula infarct.    Clinical Impression   Pt admitted with the above diagnoses and presents with below problem list. Pt will benefit from continued acute OT to address the below listed deficits and maximize independence with basic ADLs prior to d/c to venue below. PTA, pt was living alone and was mod I with basic ADLs, does not drive, is primarily a household ambulator. Pt presents with expressive aphasia, possibly receptive aphasia (vs impaired cognition?), difficulty sequencing and following multiple step commands, see cognition section below for further details. Pt also with unsteadiness ambulating in the room, utilized rw (cane at baseline). Pt will likely need 24/7 supervision at time of d/c, unsure if that can be arranged by family/friends or if pt would need to go to [facility] for further rehab prior to returning home.      Follow Up Recommendations  Supervision/Assistance - 24 hour;SNF    Equipment Recommendations  None recommended by OT    Recommendations for Other Services Speech consult;PT consult     Precautions / Restrictions Precautions Precautions: Fall Restrictions Weight Bearing Restrictions: No      Mobility Bed Mobility Overal bed mobility: Needs Assistance Bed Mobility: Supine to Sit;Sit to Supine     Supine to sit: Min guard Sit to supine: Min guard   General bed mobility comments: cues for initiation vs sequencing? Min guard for safety    Transfers Overall transfer level: Needs  assistance Equipment used: None Transfers: Sit to/from Stand Sit to Stand: Min guard         General transfer comment: min guard for safety    Balance Overall balance assessment: Needs assistance Sitting-balance support: No upper extremity supported;Feet supported Sitting balance-Leahy Scale: Fair     Standing balance support: Single extremity supported Standing balance-Leahy Scale: Poor Standing balance comment: fair static, poor dynamic                           ADL either performed or assessed with clinical judgement   ADL Overall ADL's : Needs assistance/impaired Eating/Feeding: NPO   Grooming: Set up;Sitting;Cueing for sequencing   Upper Body Bathing: Set up;Sitting;Cueing for sequencing   Lower Body Bathing: Min guard;Sit to/from stand;Cueing for sequencing   Upper Body Dressing : Set up;Cueing for sequencing;Sitting   Lower Body Dressing: Min guard;Sit to/from stand;Cueing for sequencing   Toilet Transfer: Min guard;Ambulation;Comfort height toilet;RW;Grab bars;Cueing for sequencing   Toileting- Clothing Manipulation and Hygiene: Min guard;Set up;Sitting/lateral lean;Sit to/from stand;Cueing for sequencing   Tub/ Shower Transfer: Min guard;Cueing for sequencing;Cueing for safety;Shower seat;Rolling walker   Functional mobility during ADLs: Min guard;Rolling walker General ADL Comments: Pt completed bed mobility, sat EOB a few minutes then ambulated in the room. Pt noted to seek external support and indicated that she does use a cane at baseline. Pt trialed rw and balance improved but did have some difficulty sequencing with addition of rw.      Vision Patient Visual Report: No change from baseline       Perception     Praxis  Pertinent Vitals/Pain Pain Assessment: Faces Faces Pain Scale: Hurts little more Pain Location: headache, back pain Pain Descriptors / Indicators: Aching Pain Intervention(s): Monitored during session;Limited  activity within patient's tolerance;Premedicated before session     Hand Dominance     Extremity/Trunk Assessment Upper Extremity Assessment Upper Extremity Assessment: Generalized weakness   Lower Extremity Assessment Lower Extremity Assessment: Defer to PT evaluation       Communication Communication Communication: Expressive difficulties;Receptive difficulties;Other (comment) (receptive vs impaired cognition?)   Cognition Arousal/Alertness: Awake/alert Behavior During Therapy: Flat affect;WFL for tasks assessed/performed Overall Cognitive Status: Difficult to assess                                 General Comments: receptive aphasia vs impaired cognition? decreased 2 step command following, some confusion noted processing directional cues in the room. difficulty sequencing and slow processing, Pt giving some conflicting data on PLOF iniitially stating does not use AD then stated uses a cane at baseline while pt walking in room.   General Comments       Exercises     Shoulder Instructions      Home Living Family/patient expects to be discharged to:: Private residence Living Arrangements: Alone Available Help at Discharge: Family Type of Home: House Home Access: Stairs to enter [employer] of Steps: 2+1 Entrance Stairs-Rails: None Home Layout: One level               Home Equipment: Cane - single point;Walker - 2 wheels;Shower seat (adjustable bed)          Prior Functioning/Environment Level of Independence: Independent with assistive device(s)        Comments: does not drive, household ambulator.         OT Problem List: Decreased strength;Decreased activity tolerance;Impaired balance (sitting and/or standing);Decreased cognition;Decreased safety awareness;Decreased knowledge of use of DME or AE;Decreased knowledge of precautions;Pain      OT Treatment/Interventions: Self-care/ADL training;DME and/or AE  instruction;Therapeutic activities;Cognitive remediation/compensation;Patient/family education;Balance training    OT Goals(Current goals can be found in the care plan section) Acute Rehab OT Goals Patient Stated Goal: not stated OT Goal Formulation: With patient/family Time For Goal Achievement: 04/10/20 Potential to Achieve Goals: Good ADL Goals Pt Will Perform Grooming: with modified independence;sitting;standing Pt Will Perform Upper Body Bathing: with modified independence;with set-up;sitting Pt Will Perform Lower Body Bathing: with set-up;sit to/from stand;with modified independence Pt Will Transfer to Toilet: with supervision;ambulating Pt Will Perform Toileting - Clothing Manipulation and hygiene: with supervision;sit to/from stand;with set-up;sitting/lateral leans Pt Will Perform Tub/Shower Transfer: with supervision;ambulating;shower seat  OT Frequency: Min 2X/week   Barriers to D/C:            Co-evaluation              AM-PAC OT "6 Clicks" Daily Activity     Outcome Measure Help from another person eating meals?: None Help from another person taking care of personal grooming?: A Little Help from another person toileting, which includes using toliet, bedpan, or urinal?: A Little Help from another person bathing (including washing, rinsing, drying)?: A Little Help from another person to put on and taking off regular upper body clothing?: None Help from another person to put on and taking off regular lower body clothing?: A Little 6 Click Score: 20   End of Session Equipment Utilized During Treatment: Rolling walker  Activity Tolerance: Patient limited by fatigue;Patient tolerated treatment well (currently NPO) Patient left:  in bed;with call bell/phone within reach;with bed alarm set;with family/visitor present  OT Visit Diagnosis: Unsteadiness on feet (R26.81);Pain;Muscle weakness (generalized) (M62.81);Other symptoms and signs involving cognitive  function;Cognitive communication deficit (R41.841) Symptoms and signs involving cognitive functions: Cerebral infarction                Time: 1030-1048 OT Time Calculation (min): 18 min Charges:  OT General Charges $OT Visit: 1 Visit OT Evaluation $OT Eval Moderate Complexity: [place], OT Acute Rehabilitation Services Pager: [phone] Office: [phone]'  Hortencia Pilar 03/27/2020, 11:18 AM

## 2020-03-27 NOTE — Consult Note (Signed)
[place] Nurse ostomy follow up Patient receiving care in [facility] [phone]. Son and primary RN at bedside. Stoma type/location: LUQ colostomy Stomal assessment/size: measurement deferred. Supplies have to be ordered before pouch can be removed Peristomal assessment: deferred Treatment options for stomal/peristomal skin: It does not seem that the patient/family have been using a barrier ring, only a one piece 4 inch flexible pouching system. Output:  Only flatus at time of my visit Ostomy pouching: 1pc. KitKellie Simmering [phone], ordered. Patient's son stated his sister may come tomorrow with a pouch from home and change it herself.  I explained that would be okay, but we will order a similar pouching system; we do not carry the exact pouch she uses. He verbalized his understanding.  Education provided: None.  Patient has had the ostomy for many years. Thank you for the consult.  Discussed plan of care with the patient and her son, and bedside nurse.  [place] nurse will not follow at this time.  Please re-consult the [facility] team if needed.  Val Riles, RN, MSN, CWOCN, CNS-BC, pager [phone]

## 2020-03-28 DIAGNOSIS — E785 Hyperlipidemia, unspecified: Secondary | ICD-10-CM | POA: Diagnosis present

## 2020-03-28 DIAGNOSIS — I482 Chronic atrial fibrillation, unspecified: Secondary | ICD-10-CM | POA: Diagnosis present

## 2020-03-28 DIAGNOSIS — Z79890 Hormone replacement therapy (postmenopausal): Secondary | ICD-10-CM | POA: Diagnosis not present

## 2020-03-28 DIAGNOSIS — I639 Cerebral infarction, unspecified: Secondary | ICD-10-CM | POA: Diagnosis present

## 2020-03-28 DIAGNOSIS — I2510 Atherosclerotic heart disease of native coronary artery without angina pectoris: Secondary | ICD-10-CM | POA: Diagnosis present

## 2020-03-28 DIAGNOSIS — Z8673 Personal history of transient ischemic attack (TIA), and cerebral infarction without residual deficits: Secondary | ICD-10-CM | POA: Diagnosis not present

## 2020-03-28 DIAGNOSIS — R519 Headache, unspecified: Secondary | ICD-10-CM | POA: Diagnosis present

## 2020-03-28 DIAGNOSIS — Z8249 Family history of ischemic heart disease and other diseases of the circulatory system: Secondary | ICD-10-CM | POA: Diagnosis not present

## 2020-03-28 DIAGNOSIS — Z881 Allergy status to other antibiotic agents status: Secondary | ICD-10-CM | POA: Diagnosis not present

## 2020-03-28 DIAGNOSIS — C785 Secondary malignant neoplasm of large intestine and rectum: Secondary | ICD-10-CM | POA: Diagnosis present

## 2020-03-28 DIAGNOSIS — Z20822 Contact with and (suspected) exposure to covid-19: Secondary | ICD-10-CM | POA: Diagnosis present

## 2020-03-28 DIAGNOSIS — Z933 Colostomy status: Secondary | ICD-10-CM | POA: Diagnosis not present

## 2020-03-28 DIAGNOSIS — I10 Essential (primary) hypertension: Secondary | ICD-10-CM | POA: Diagnosis present

## 2020-03-28 DIAGNOSIS — Z79899 Other long term (current) drug therapy: Secondary | ICD-10-CM | POA: Diagnosis not present

## 2020-03-28 DIAGNOSIS — R4701 Aphasia: Secondary | ICD-10-CM | POA: Diagnosis present

## 2020-03-28 DIAGNOSIS — I272 Pulmonary hypertension, unspecified: Secondary | ICD-10-CM | POA: Diagnosis present

## 2020-03-28 DIAGNOSIS — R29701 NIHSS score 1: Secondary | ICD-10-CM | POA: Diagnosis present

## 2020-03-28 DIAGNOSIS — M1990 Unspecified osteoarthritis, unspecified site: Secondary | ICD-10-CM | POA: Diagnosis present

## 2020-03-28 DIAGNOSIS — Z823 Family history of stroke: Secondary | ICD-10-CM | POA: Diagnosis not present

## 2020-03-28 DIAGNOSIS — I63512 Cerebral infarction due to unspecified occlusion or stenosis of left middle cerebral artery: Secondary | ICD-10-CM | POA: Diagnosis present

## 2020-03-28 DIAGNOSIS — I350 Nonrheumatic aortic (valve) stenosis: Secondary | ICD-10-CM | POA: Diagnosis present

## 2020-03-28 DIAGNOSIS — E039 Hypothyroidism, unspecified: Secondary | ICD-10-CM | POA: Diagnosis present

## 2020-03-28 DIAGNOSIS — Z888 Allergy status to other drugs, medicaments and biological substances status: Secondary | ICD-10-CM | POA: Diagnosis not present

## 2020-03-28 DIAGNOSIS — Z841 Family history of disorders of kidney and ureter: Secondary | ICD-10-CM | POA: Diagnosis not present

## 2020-03-28 DIAGNOSIS — K219 Gastro-esophageal reflux disease without esophagitis: Secondary | ICD-10-CM | POA: Diagnosis present

## 2020-03-28 DIAGNOSIS — Z951 Presence of aortocoronary bypass graft: Secondary | ICD-10-CM | POA: Diagnosis not present

## 2020-03-28 LAB — BASIC METABOLIC PANEL
Anion gap: 10 (ref 5–15)
BUN: 9 mg/dL (ref 8–23)
CO2: 26 mmol/L (ref 22–32)
Calcium: 9.5 mg/dL (ref 8.9–10.3)
Chloride: 104 mmol/L (ref 98–111)
Creatinine, Ser: 0.76 mg/dL (ref 0.44–1.00)
GFR, Estimated: >60 mL/min (ref >60)
Glucose, Bld: 117 mg/dL — ABNORMAL HIGH (ref 70–99)
Potassium: 3.2 mmol/L — ABNORMAL LOW (ref 3.5–5.1)
Sodium: 140 mmol/L (ref 135–145)

## 2020-03-28 LAB — LIPID PANEL
Cholesterol: 431 mg/dL — ABNORMAL HIGH (ref 0–200)
HDL: 58 mg/dL (ref >40)
LDL Cholesterol: 358 mg/dL — ABNORMAL HIGH (ref 0–99)
Total CHOL/HDL Ratio: 7.4 RATIO
Triglycerides: 75 mg/dL (ref <150)
VLDL: 15 mg/dL (ref 0–40)

## 2020-03-28 LAB — HEMOGLOBIN A1C
Hgb A1c MFr Bld: 5.6 % (ref 4.8–5.6)
Mean Plasma Glucose: 114 mg/dL

## 2020-03-28 LAB — MAGNESIUM: Magnesium: 1.9 mg/dL (ref 1.7–2.4)

## 2020-03-28 MED ORDER — CHLORHEXIDINE GLUCONATE CLOTH 2 % EX PADS
6.0000 | MEDICATED_PAD | Freq: Every day | CUTANEOUS | Status: DC
  Administered 2020-03-28: 6 via TOPICAL

## 2020-03-28 MED ORDER — POLYETHYLENE GLYCOL 3350 17 G PO PACK
17.0000 g | PACK | Freq: Every day | ORAL | Status: DC
  Administered 2020-03-28 – 2020-03-29 (×2): 17 g via ORAL
  Filled 2020-03-28 (×3): qty 1

## 2020-03-28 MED ORDER — POTASSIUM CHLORIDE CRYS ER 20 MEQ PO TBCR
40.0000 meq | EXTENDED_RELEASE_TABLET | Freq: Once | ORAL | Status: AC
  Administered 2020-03-28: 40 meq via ORAL
  Filled 2020-03-28: qty 2

## 2020-03-28 NOTE — TOC Benefit Eligibility Note (Signed)
Transition of Care [facility]) Benefit Eligibility Note    Patient Details  Name: Shannon Scott MRN: [id number] Date of Birth: 09-22-1940   Medication/Dose: Eliquis 6m. bid for 30 day supply  Covered?: Yes  Tier: 3 Drug  Prescription Coverage [facility]/Company/Phone Number:: ED[facility]etrius [facility] PH# [phone] Co-Pay: $47.00  Prior Approval: No  Deductible: Unmet       HShelda AltesPhone Number: 03/28/2020, 4:02 PM

## 2020-03-28 NOTE — Plan of Care (Signed)
Pt alert and oriented but confused on time. BP have been in the 180's. Tele in place. Pt is on RA. LLE +2 edema. HA controlled with prn pain meds.  Problem: Education: Goal: Knowledge of General Education information will improve Description: Including pain rating scale, medication(s)/side effects and non-pharmacologic comfort measures Outcome: Progressing   Problem: Health Behavior/Discharge Planning: Goal: Ability to manage health-related needs will improve Outcome: Progressing   Problem: Clinical Measurements: Goal: Ability to maintain clinical measurements within normal limits will improve Outcome: Progressing Goal: Will remain free from infection Outcome: Progressing Goal: Diagnostic test results will improve Outcome: Progressing Goal: Respiratory complications will improve Outcome: Progressing Goal: Cardiovascular complication will be avoided Outcome: Progressing   Problem: Activity: Goal: Risk for activity intolerance will decrease Outcome: Progressing   Problem: Nutrition: Goal: Adequate nutrition will be maintained Outcome: Progressing   Problem: Coping: Goal: Level of anxiety will decrease Outcome: Progressing   Problem: Elimination: Goal: Will not experience complications related to bowel motility Outcome: Progressing Goal: Will not experience complications related to urinary retention Outcome: Progressing   Problem: Pain Managment: Goal: General experience of comfort will improve Outcome: Progressing   Problem: Safety: Goal: Ability to remain free from injury will improve Outcome: Progressing   Problem: Skin Integrity: Goal: Risk for impaired skin integrity will decrease Outcome: Progressing   Problem: Education: Goal: Knowledge of disease or condition will improve Outcome: Progressing Goal: Knowledge of secondary prevention will improve Outcome: Progressing Goal: Knowledge of patient specific risk factors addressed and post discharge goals  established will improve Outcome: Progressing Goal: Individualized Educational Video(s) Outcome: Progressing   Problem: Coping: Goal: Will verbalize positive feelings about self Outcome: Progressing Goal: Will identify appropriate support needs Outcome: Progressing   Problem: Health Behavior/Discharge Planning: Goal: Ability to manage health-related needs will improve Outcome: Progressing   Problem: Self-Care: Goal: Ability to participate in self-care as condition permits will improve Outcome: Progressing Goal: Verbalization of feelings and concerns over difficulty with self-care will improve Outcome: Progressing Goal: Ability to communicate needs accurately will improve Outcome: Progressing   Problem: Nutrition: Goal: Risk of aspiration will decrease Outcome: Progressing Goal: Dietary intake will improve Outcome: Progressing   Problem: Ischemic Stroke/TIA Tissue Perfusion: Goal: Complications of ischemic stroke/TIA will be minimized Outcome: Progressing

## 2020-03-28 NOTE — TOC Initial Note (Signed)
Transition of Care [facility]) - Initial/Assessment Note    Patient Details  Name: Shannon Scott MRN: [id number] Date of Birth: 1941-02-02  Transition of Care [facility]) CM/SW Contact:    Pollie Friar, RN Phone Number: 03/28/2020, 1:39 PM  Clinical Narrative:                 CM met with the patient and her daughter at the bedside. Daughter then also called pts son and he participated over speaker phone. Pt has expressive aphasia. Daughter states she can provide needed supervision at home.  Awaiting OT re-eval and ST to see about HH needs. If HH needed they request [facility].  CM will provide 30 day free card for Eliquis today so they will have at d/c.  ToC following.  Expected Discharge Plan: [place] Barriers to Discharge: Continued Medical Work up   Patient Goals and CMS Choice   CMS Medicare.gov Compare Post Acute Care list provided to:: Patient Represenative (must comment) Choice offered to / list presented to : Adult Children  Expected Discharge Plan and Services Expected Discharge Plan: [place]   Discharge Planning Services: CM Consult Post Acute Care Choice: [place] arrangements for the past 2 months: Single Family Home                                      Prior Living Arrangements/Services Living arrangements for the past 2 months: Single Family Home Lives with:: Self Patient language and need for interpreter reviewed:: Yes Do you feel safe going back to the place where you live?: Yes      Need for Family Participation in Patient Care: Yes (Comment) Care giver support system in place?: Yes (comment) Current home services: DME (walker/ cane/ shower seat/ 3 in 1) Criminal Activity/Legal Involvement Pertinent to Current Situation/Hospitalization: No - Comment as needed  Activities of Daily Living Home Assistive Devices/Equipment: Walker (specify type) ADL Screening (condition at time of admission) Patient's cognitive  ability adequate to safely complete daily activities?: Yes Is the patient deaf or have difficulty hearing?: No Does the patient have difficulty seeing, even when wearing glasses/contacts?: No Does the patient have difficulty concentrating, remembering, or making decisions?: No Patient able to express need for assistance with ADLs?: Yes Does the patient have difficulty dressing or bathing?: No Independently performs ADLs?: Yes (appropriate for developmental age) Does the patient have difficulty walking or climbing stairs?: No Weakness of Legs: None Weakness of Arms/Hands: None  Permission Sought/Granted                  Emotional Assessment Appearance:: Appears stated age     Orientation: : Oriented to Self, Oriented to Place   Psych Involvement: No (comment)  Admission diagnosis:  Aphasia [R47.01] Stroke [facility]) [I63.9] Cerebrovascular accident (CVA), unspecified mechanism ([place]) [I63.9] Acute CVA (cerebrovascular accident) [facility]) [I63.9] Patient Active Problem List   Diagnosis Date Noted  . Acute CVA (cerebrovascular accident) ([place]) 03/28/2020  . Stroke ([place]) 03/26/2020  . Expressive aphasia 03/26/2020  . Acute vaginitis 01/03/2020  . Acute vulvitis 01/03/2020  . Lower extremity edema 11/09/2018  . Back pain 11/09/2018  . IDA (iron deficiency anemia) 10/21/2017  . Abdominal mass   . Deep vein thrombosis (DVT) of left lower extremity ([place])   . Pelvic mass   . Acute blood loss anemia   . Malignant neoplasm involving organ by  non-direct metastasis from uterine cervix ([place]) 10/11/2017  . Goals of care, counseling/discussion 10/11/2017  . Rectal bleeding 10/03/2017  . Normocytic anemia 10/03/2017  . Left hip pain 10/03/2017  . Generalized weakness 10/03/2017  . Colostomy status ([place]) 08/30/2017  . Ureteral stent displacement, subsequent encounter 08/30/2017  . Atrial fibrillation with RVR ([place]) 08/30/2017  . Hypophosphatemia 08/30/2017  . Hypomagnesemia 08/30/2017  .  Acute blood loss as cause of postoperative anemia 08/30/2017  . Chronic anemia 08/30/2017  . Left leg swelling 08/30/2017  . GERD (gastroesophageal reflux disease) 08/30/2017  . Atrial fibrillation, chronic ([place])   . Bowel perforation ([place]) 08/18/2017  . Colonic mass 08/18/2017  . Ureteral dilatation 08/18/2017  . Intractable right heel pain 10/07/2016  . Plantar fasciitis of right foot 10/07/2016  . Postoperative nausea and vomiting 01/02/2016  . Hyponatremia 01/01/2016  . Acute hypokalemia 01/01/2016  . Endometrial cancer ([place]) 12/30/2015  . Familial hypercholesteremia 07/28/2015  . Aortic valve stenosis 08/02/2014  . S/P CABG x 3 01/13/2013  . Palpitations 01/13/2013  . Medication intolerance 01/13/2013  . Chronic pain 01/13/2013  . TIA (transient ischemic attack) 01/13/2013  . Carotid stenosis 01/13/2013  . Hypothyroid 10/05/2010  . Essential hypertension 10/05/2010  . Gastroesophagitis 10/05/2010  . Atrophic gastritis 10/05/2010   PCP:  Leeroy Cha, MD Pharmacy:   [facility] [place], [address] [place] [place] [address] Phone: [phone] Fax: [phone]  [id number] - [place], [place] - [address] [place] [place] [address] Phone: [phone] Fax: [phone]  [place], [place], [address] [place], [place] [address] [place] [address] Phone: [phone] Fax: [place], [place] - [address] [place] [address] Phone: [phone] Fax: [phone]     Social Determinants of Health (SDOH) Interventions    Readmission Risk Interventions No flowsheet data found.

## 2020-03-28 NOTE — Progress Notes (Signed)
STROKE TEAM PROGRESS NOTE   INTERVAL HISTORY Her daughter is at the bedside. language improving.  Neuro exam is stable.  Vital signs stable.  Patient has been started on Eliquis.  Vitals:   03/27/20 2000 03/28/20 0059 03/28/20 0438 03/28/20 0900  BP: (!) 169/76 (!) 186/81 (!) 183/79 (!) 176/62  Pulse: 86 75 71 79  Resp: 20  16 20   Temp: 98.3 F (36.8 C) 98.3 F (36.8 C) 98.2 F (36.8 C) 98.5 F (36.9 C)  TempSrc: Oral Oral Oral Oral  SpO2: 97% 98% 96% 98%  Weight:      Height:       CBC:  Recent Labs  Lab 03/26/20 1142 03/26/20 1145  WBC 5.9  --   NEUTROABS 5.0  --   HGB 13.5 13.9  HCT 40.2 41.0  MCV 90.5  --   PLT 300  --    Basic Metabolic Panel:  Recent Labs  Lab 03/26/20 1142 03/26/20 1142 03/26/20 1145 03/28/20 0248  NA 141   < > 139 140  K 3.8   < > 3.9 3.2*  CL 104   < > 103 104  CO2 27  --   --  26  GLUCOSE 119*   < > 116* 117*  BUN 15   < > 14 9  CREATININE 0.67   < > 0.70 0.76  CALCIUM 9.8  --   --  9.5  MG  --   --   --  1.9   < > = values in this interval not displayed.   Lipid Panel: No results for input(s): CHOL, TRIG, HDL, CHOLHDL, VLDL, LDLCALC in the last 168 hours. HgbA1c: No results for input(s): HGBA1C in the last 168 hours. Urine Drug Screen: No results for input(s): LABOPIA, COCAINSCRNUR, LABBENZ, AMPHETMU, THCU, LABBARB in the last 168 hours.  Alcohol Level No results for input(s): ETH in the last 168 hours.  IMAGING past 24 hours No results found.  PHYSICAL EXAM   Pleasant frail elderly Caucasian lady not in distress. . Afebrile. Head is nontraumatic. Neck is supple without bruit.    Cardiac exam no murmur or gallop. Lungs are clear to auscultation. Distal pulses are well felt. Neurological Exam : She is awake alert and has moderate expressive and mild receptive aphasia is able to speak occasional short sentences and few words.  She has significant word hesitancy and occasional paraphasic errors.  She is unable to repeat.   Comprehension slightly impaired.  She follows simple midline and one-step commands.  Extraocular movements are full range without nystagmus.  Blinks to threat bilaterally.  Right lower facial weakness.  Tongue midline.  Motor system exam shows symmetric upper and lower extremity strength without focal weakness.  Sensation appears intact bilaterally.  Gait not tested.   ASSESSMENT/PLAN [name] is a 79 y.o. female with history of  asthma, CAD, HLD, afib, HTN, Uterina Ca locally advanced presenting to [facility] ED with expressive aphasia, R sided HA and neck pain. Had some worsening following admission, IR considered, but not felt to be a candidate.  Stroke:   L MCA infarct embolic secondary to known AF not on AC  CT head No acute abnormality. R basal ganglia lacune new from 09/2010. R subinsular white matter infarct. Atrophy.   MRI  Inferior L parietal lobe and posterior insular infarct. Small vessel disease. Chronic R basal ganglia lacune.  CTA head & neck no new infarct. L M2 and possible M3 branch irregularilty. ICA <  50% plaque. Irregular plaque origin L ICA.  CT perfusion no core, 27m penumbra.  2D Echo EF 60-65%. No source of embolus    LDL 358 mg percent  HgbA1c pending      VTE prophylaxis - Lovenox 40 mg sq daily   No antithrombotic prior to admission, received aspirin 81 10/27, now on Eliquis (apixaban) daily for secondary stroke prevention  Therapy recommendations:  Supervision, no PT or OT, needs SLP  Disposition:  pending   Atrial Fibrillation  Home anticoagulation:  none  . Had AF in 2019 d/t stress from coloscopy prep. [facility] given stroke, will add AC at this time . Continue Eliquis (apixaban) daily at discharge   Hypertension  On the high side 210/76 day of admission   Stable pm the high side . Permissive hypertension (OK if < 220/120) but gradually normalize in 5-7 days . Long-term BP goal  normotensive  Hyperlipidemia  Home meds:  No statin  Intolerant to statins w/ myalgias and memory loss  Cardiology following for PCS-K 9 injections  LDL 358 mg percent goal < 70  Other Stroke Risk Factors  Advanced age  Coronary artery disease  Other Active Problems  Uterine cancer w/ colonic mets s/p colostomy, no recurrence per CT 03/17/20  Hospital day # 0  Continue Eliquis for stroke prevention given prior history of paroxysmal A. fib.  Recommend start PCSK9 inhibitor injections like Praluent or Repatha given significantly elevated LDL and prior history of statin intolerance.  Outpatient speech therapy.  Long discussion with the patient and daughter at the bedside and answered questions.  Discussed with Dr. AReesa Chew  Greater than 50% time during this 25-minute visit was spent in counseling and coordination of care about her embolic stroke and aphasia and answering questions.  Stroke team will sign off.  Kindly call for questions. PAntony Contras MD   To contact Stroke Continuity provider, please refer to [email or web address] After hours, contact General Neurology

## 2020-03-28 NOTE — Progress Notes (Signed)
PROGRESS NOTE    LAURIE PENADO  SNK:[id number] DOB: 02-08-1941 DOA: 03/26/2020 PCP: Leeroy Cha, MD   Brief Narrative:  79 year old with history of CAD status post CABG, TIA, uterine cancer with colonic metastases status post colostomy, A. fib, HTN, HLD, hypothyroidism admitted for slurred speech.  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  CTA head and neck showed left M3 MCA branch occlusion.   Assessment & Plan:   Principal Problem:   Expressive aphasia Active Problems:   Essential hypertension   Atrial fibrillation, chronic (HCC)   Stroke (HCC)   Acute CVA (cerebrovascular accident) ([place])  Slurred speech Acute left parietal and posterior insular infarct -MRI showed acute left parietal/posterior insular infarct, CTA head and neck showed left M3 MCA branch occlusion.  Neurology team following.  Case discussed by neuro interventional list, not a candidate for thrombectomy. -Echocardiogram-EF 60 to 93%, grade 2 diastolic dysfunction, moderate AS -Lipid panel, A1c-pending -PT/OT -Will be on Eliquis due to history of atrial fibrillation in the records  Essential hypertension, uncontrolled -On diltiazem.  Will allow blood pressure to go as high as 220 for permissive hypertension.  History of uterine cancer with colonic metastases status post colostomy -Stable  Constipation -Bowel regimen including MiraLAX ordered  CAD status post CABG -Currently chest pain-free.  Hypothyroidism -Synthroid 50 mcg daily   DVT prophylaxis: Lovenox Code Status: Full code Family Communication: Daughter at bedside, spoke with son over FaceTime   Dispo: The patient is from: Home              Anticipated d/c is to: Home              Anticipated d/c date is: 1 day              Patient currently is not medically stable to d/c.  Ongoing neurology work-up.  Also family is adamant that she has a bowel movement prior to leaving the hospital home and do not feel  comfortable taking her home today.  We will be discharging her home tomorrow in the meantime today will await neurology clearance      Body mass index is 27.49 kg/m.     Subjective: Patient appears to be slightly better today but still having mild expressive aphasia.  Has not had a bowel movement in her ostomy bag last 48 hours.  Family reasonably seems to be very concerned about it.  No other new complaints  Review of Systems Otherwise negative except as per HPI, including: General: Denies fever, chills, night sweats or unintended weight loss. Resp: Denies cough, wheezing, shortness of breath. Cardiac: Denies chest pain, palpitations, orthopnea, paroxysmal nocturnal dyspnea. GI: Denies abdominal pain, nausea, vomiting, diarrhea or constipation GU: Denies dysuria, frequency, hesitancy or incontinence MS: Denies muscle aches, joint pain or swelling Neuro: Denies headache, neurologic deficits (focal weakness, numbness, tingling), abnormal gait Psych: Denies anxiety, depression, SI/HI/AVH Skin: Denies new rashes or lesions ID: Denies sick contacts, exotic exposures, travel  Examination:  Constitutional: Not in acute distress Respiratory: Clear to auscultation bilaterally Cardiovascular: Normal sinus rhythm, no rubs Abdomen: Nontender nondistended good bowel sounds Musculoskeletal: No edema noted Skin: No rashes seen Neurologic: CN 2-12 grossly intact.  And nonfocal, mild expressive aphasia/word finding difficulty Psychiatric: Normal judgment and insight. Alert and oriented x 3. Normal mood.  Colostomy in left lower quadrant noted  Objective: Vitals:   03/27/20 2000 03/28/20 0059 03/28/20 0438 03/28/20 0900  BP: (!) 169/76 (!) 186/81 (!) 183/79 (!) 176/62  Pulse: 86  75 71 79  Resp: 20  16 20   Temp: 98.3 F (36.8 C) 98.3 F (36.8 C) 98.2 F (36.8 C) 98.5 F (36.9 C)  TempSrc: Oral Oral Oral Oral  SpO2: 97% 98% 96% 98%  Weight:      Height:        Intake/Output  Summary (Last 24 hours) at 03/28/2020 1102 Last data filed at 03/28/2020 1045 Gross per 24 hour  Intake --  Output 1000 ml  Net -1000 ml   Filed Weights   03/26/20 1512  Weight: 66 kg     Data Reviewed:   CBC: Recent Labs  Lab 03/26/20 1142 03/26/20 1145  WBC 5.9  --   NEUTROABS 5.0  --   HGB 13.5 13.9  HCT 40.2 41.0  MCV 90.5  --   PLT 300  --    Basic Metabolic Panel: Recent Labs  Lab 03/26/20 1142 03/26/20 1145 03/28/20 0248  NA 141 139 140  K 3.8 3.9 3.2*  CL 104 103 104  CO2 27  --  26  GLUCOSE 119* 116* 117*  BUN 15 14 9   CREATININE 0.67 0.70 0.76  CALCIUM 9.8  --  9.5  MG  --   --  1.9   GFR: Estimated Creatinine Clearance: 50.4 mL/min (by C-G formula based on SCr of 0.76 mg/dL). Liver Function Tests: Recent Labs  Lab 03/26/20 1142  AST 17  ALT 11  ALKPHOS 73  BILITOT 0.7  PROT 8.1  ALBUMIN 4.5   No results for input(s): LIPASE, AMYLASE in the last 168 hours. No results for input(s): AMMONIA in the last 168 hours. Coagulation Profile: Recent Labs  Lab 03/26/20 1142  INR 1.0   Cardiac Enzymes: No results for input(s): CKTOTAL, CKMB, CKMBINDEX, TROPONINI in the last 168 hours. BNP (last 3 results) No results for input(s): PROBNP in the last 8760 hours. HbA1C: No results for input(s): HGBA1C in the last 72 hours. CBG: Recent Labs  Lab 03/26/20 1139 03/26/20 1728  GLUCAP 116* 104*   Lipid Profile: No results for input(s): CHOL, HDL, LDLCALC, TRIG, CHOLHDL, LDLDIRECT in the last 72 hours. Thyroid Function Tests: No results for input(s): TSH, T4TOTAL, FREET4, T3FREE, THYROIDAB in the last 72 hours. Anemia Panel: No results for input(s): VITAMINB12, FOLATE, FERRITIN, TIBC, IRON, RETICCTPCT in the last 72 hours. Sepsis Labs: No results for input(s): PROCALCITON, LATICACIDVEN in the last 168 hours.  Recent Results (from the past 240 hour(s))  Respiratory Panel by RT PCR (Flu A&B, Covid) - Nasopharyngeal Swab     Status: None    Collection Time: 03/26/20  3:10 PM   Specimen: Nasopharyngeal Swab  Result Value Ref Range Status   SARS Coronavirus 2 by RT PCR NEGATIVE NEGATIVE Final    Comment: (NOTE) SARS-CoV-2 target nucleic acids are NOT DETECTED.  The SARS-CoV-2 RNA is generally detectable in upper respiratoy specimens during the acute phase of infection. The lowest concentration of SARS-CoV-2 viral copies this assay can detect is 131 copies/mL. A negative result does not preclude SARS-Cov-2 infection and should not be used as the sole basis for treatment or other patient management decisions. A negative result may occur with  improper specimen collection/handling, submission of specimen other than nasopharyngeal swab, presence of viral mutation(s) within the areas targeted by this assay, and inadequate number of viral copies (<131 copies/mL). A negative result must be combined with clinical observations, patient history, and epidemiological information. The expected result is Negative.  Fact Sheet for Patients:  PinkCheek.be  Fact  Sheet for Healthcare Providers:  GravelBags.it  This test is no t yet approved or cleared by the [place] FDA and  has been authorized for detection and/or diagnosis of SARS-CoV-2 by FDA under an Emergency Use Authorization (EUA). This EUA will remain  in effect (meaning this test can be used) for the duration of the COVID-19 declaration under Section 564(b)(1) of the Act, 21 U.S.C. section 360bbb-3(b)(1), unless the authorization is terminated or revoked sooner.     Influenza A by PCR NEGATIVE NEGATIVE Final   Influenza B by PCR NEGATIVE NEGATIVE Final    Comment: (NOTE) The Xpert Xpress SARS-CoV-2/FLU/RSV assay is intended as an aid in  the diagnosis of influenza from Nasopharyngeal swab specimens and  should not be used as a sole basis for treatment. Nasal washings and  aspirates are unacceptable for Xpert  Xpress SARS-CoV-2/FLU/RSV  testing.  Fact Sheet for Patients: PinkCheek.be  Fact Sheet for Healthcare Providers: GravelBags.it  This test is not yet approved or cleared by the [place] FDA and  has been authorized for detection and/or diagnosis of SARS-CoV-2 by  FDA under an Emergency Use Authorization (EUA). This EUA will remain  in effect (meaning this test can be used) for the duration of the  Covid-19 declaration under Section 564(b)(1) of the Act, 21  U.S.C. section 360bbb-3(b)(1), unless the authorization is  terminated or revoked. Performed at [facility], [place] [address] [place], [place] [address]          Radiology Studies: CT Head Wo Contrast  Result Date: 03/26/2020 CLINICAL DATA:  Neuro deficit, acute, stroke suspected; aphasia, headache, right neck pain. EXAM: CT HEAD WITHOUT CONTRAST TECHNIQUE: Contiguous axial images were obtained from the base of the skull through the vertex without intravenous contrast. COMPARISON:  Prior head CT 10/06/2010. FINDINGS: Brain: Mild generalized cerebral atrophy. Age-indeterminate lacunar infarct within the right basal ganglia (series 2, image 12) (series 5, image 32). Redemonstrated prominent perivascular space versus chronic lacunar infarct within the right subinsular white matter (series 2, image 11). There is no acute intracranial hemorrhage. No demarcated cortical infarct. No extra-axial fluid collection. No evidence of intracranial mass. No midline shift. Vascular: No hyperdense vessel.  Atherosclerotic calcifications. Skull: Normal. Negative for fracture or focal lesion. Sinuses/Orbits: Visualized orbits show no acute finding. No significant paranasal sinus disease or mastoid effusion at the imaged levels IMPRESSION: No evidence of acute intracranial hemorrhage or acute demarcated cortical infarction. Right basal ganglia lacunar infarct, new as compared  to the head CT of 10/06/2010, but otherwise age indeterminate. Redemonstrated prominent perivascular space versus chronic lacunar infarct within the right subinsular white matter. Mild cerebral atrophy. Electronically Signed   By: Kellie Simmering DO   On: 03/26/2020 12:19   MR BRAIN WO CONTRAST  Result Date: 03/26/2020 CLINICAL DATA:  Neuro deficit, acute stroke suspected. EXAM: MRI HEAD WITHOUT CONTRAST TECHNIQUE: Multiplanar, multiecho pulse sequences of the brain and surrounding structures were obtained without intravenous contrast. COMPARISON:  Same day head CT. FINDINGS: Brain: There is restricted diffusion involving the inferior left parietal lobe and posterior aspect of the insula. There is mild associated T2/FLAIR hyperintensity without substantial mass effect. Additional scattered T2/FLAIR hyperintensities in the white matter, compatible with chronic microvascular ischemic disease. Mild cerebral volume loss. No hydrocephalus. No acute hemorrhage. No mass lesion or abnormal mass effect. Remote lacunar infarcts in the right basal ganglia. Vascular: Major arterial flow voids are maintained at the skull base. Skull and upper cervical spine: Normal marrow signal. Sinuses/Orbits: Sinuses  are clear.  Unremarkable orbits. Other: No mastoid effusions. IMPRESSION: 1. Acute infarct in the inferior left parietal lobe and posterior insula. Mild edema without substantial mass effect. 2. Chronic microvascular ischemic disease and remote lacunar infarcts in the right basal ganglia. Electronically Signed   By: Margaretha Sheffield MD   On: 03/26/2020 13:27   CT CEREBRAL PERFUSION W CONTRAST  Result Date: 03/26/2020 CLINICAL DATA:  Left parietal stroke, episodes of difficulty paraphrasing EXAM: CT ANGIOGRAPHY HEAD AND NECK CT PERFUSION BRAIN TECHNIQUE: Multidetector CT imaging of the head and neck was performed using the standard protocol during bolus administration of intravenous contrast. Multiplanar CT image  reconstructions and MIPs were obtained to evaluate the vascular anatomy. Carotid stenosis measurements (when applicable) are obtained utilizing NASCET criteria, using the distal internal carotid diameter as the denominator. Multiphase CT imaging of the brain was performed following IV bolus contrast injection. Subsequent parametric perfusion maps were calculated using RAPID software. CONTRAST:  166m OMNIPAQUE IOHEXOL 350 MG/ML SOLN COMPARISON:  Correlation made with MRI earlier same day FINDINGS: CTA NECK FINDINGS Aortic arch: Calcified plaque along the aortic arch and great vessel origins, which are patent. Right carotid system: Patent. Diffuse atherosclerotic irregularity of the common carotid. Mixed plaque at the bifurcation and proximal internal carotid causing less than 50% stenosis. Left carotid system: Patent. Diffuse atherosclerotic irregularity of the common carotid. Mixed plaque at the bifurcation and ICA origin causing less than 50% stenosis. Noncalcified plaque appears irregular. Vertebral arteries: Patent and codominant. Plaque at the origins without high-grade stenosis. Skeleton: Degenerative changes of the cervical spine. Other neck: No mass or adenopathy. Upper chest: Right chest wall port catheter enters the SVC. No apical lung mass. Review of the MIP images confirms the above findings CTA HEAD FINDINGS Anterior circulation: Intracranial internal carotid arteries are patent with calcified plaque causing mild stenosis of the right and mild to moderate stenosis on the left. Right middle and both anterior cerebral arteries are patent. Left M1 MCA is patent. No proximal left M2 MCA occlusion identified. Left M2 MCA branch atherosclerotic irregularity and a possible M3 branch occlusion. However, evaluation is suboptimal due to presence of significant venous enhancement. Posterior circulation: Intracranial vertebral arteries are patent with atherosclerotic irregularity and mild stenosis. Basilar artery  is patent. Posterior cerebral arteries are patent with atherosclerotic irregularity and areas of mild to moderate stenosis. Venous sinuses: As permitted by contrast timing, patent. Review of the MIP images confirms the above findings CT Brain Perfusion Findings: CBF (<30%) Volume: 064mPerfusion (Tmax>6.0s) volume: 2063mismatch Volume: 66m81mfarction Location: Territory at risk is posterior to the area of infarction on MRI. IMPRESSION: Perfusion imaging demonstrates does not show infarct from MRI likely due to timing. There is no new core infarction but 20 mL of territory at risk is identified posterior to the area of restricted diffusion. There is no left M1 or proximal M2 MCA occlusion. Atherosclerotic irregularity of left M2 MCA branches and possible M3 MCA branch occlusion. Suboptimal evaluation of distal branches due to venous enhancement. Plaque at the ICA origins causing less than 50% stenosis. Noncalcified plaque at the left ICA origin as an irregular appearance. Electronically Signed   By: PranMacy Mis.   On: 03/26/2020 15:57   ECHOCARDIOGRAM COMPLETE  Result Date: 03/27/2020    ECHOCARDIOGRAM REPORT   Patient Name:   EDITIRENA GAYDOSe of Exam: 03/27/2020 Medical Rec #:  [id number]     Height:       61.0  in Accession #:    [id number]        Weight:       145.5 lb Date of Birth:  06-03-1940          BSA:          1.650 m Patient Age:    63 years          BP:           193/84 mmHg Patient Gender: F                 HR:           80 bpm. Exam Location:  Inpatient Procedure: 2D Echo Indications:    stroke 434.91  History:        Patient has prior history of Echocardiogram examinations, most                 recent 10/04/2019. Prior CABG, Stroke, Arrythmias:Atrial                 Fibrillation; Risk Factors:Hypertension.  Sonographer:    Johny Chess Referring Phys: [id number] [place]  1. Left ventricular ejection fraction, by estimation, is 60 to 65%. The left ventricle has  normal function. The left ventricle has no regional wall motion abnormalities. There is mild left ventricular hypertrophy. Left ventricular diastolic parameters are consistent with Grade II diastolic dysfunction (pseudonormalization).  2. Right ventricular systolic function is normal. The right ventricular size is normal. There is mildly elevated pulmonary artery systolic pressure. The estimated right ventricular systolic pressure is 53.6 mmHg.  3. Left atrial size was moderately dilated.  4. The mitral valve is normal in structure. Mild mitral valve regurgitation. No evidence of mitral stenosis. Moderate to severe mitral annular calcification.  5. The aortic valve is tricuspid. Aortic valve regurgitation is not visualized. Moderate aortic valve stenosis. Aortic valve area, by VTI measures 1.40 cm. Aortic valve mean gradient measures 19.0 mmHg.  6. The inferior vena cava is normal in size with greater than 50% respiratory variability, suggesting right atrial pressure of 3 mmHg. FINDINGS  Left Ventricle: Left ventricular ejection fraction, by estimation, is 60 to 65%. The left ventricle has normal function. The left ventricle has no regional wall motion abnormalities. The left ventricular internal cavity size was normal in size. There is  mild left ventricular hypertrophy. Left ventricular diastolic parameters are consistent with Grade II diastolic dysfunction (pseudonormalization). Right Ventricle: The right ventricular size is normal. No increase in right ventricular wall thickness. Right ventricular systolic function is normal. There is mildly elevated pulmonary artery systolic pressure. The tricuspid regurgitant velocity is 3.09  m/s, and with an assumed right atrial pressure of 3 mmHg, the estimated right ventricular systolic pressure is 64.4 mmHg. Left Atrium: Left atrial size was moderately dilated. Right Atrium: Right atrial size was normal in size. Pericardium: There is no evidence of pericardial effusion.  Mitral Valve: The mitral valve is normal in structure. There is moderate calcification of the mitral valve leaflet(s). Moderate to severe mitral annular calcification. Mild mitral valve regurgitation. No evidence of mitral valve stenosis. Tricuspid Valve: The tricuspid valve is normal in structure. Tricuspid valve regurgitation is mild. Aortic Valve: The aortic valve is tricuspid. Aortic valve regurgitation is not visualized. Moderate aortic stenosis is present. Aortic valve mean gradient measures 19.0 mmHg. Aortic valve peak gradient measures 31.9 mmHg. Aortic valve area, by VTI measures 1.40 cm. Pulmonic Valve: The pulmonic valve was normal in structure. Pulmonic valve regurgitation is  not visualized. Aorta: The aortic root is normal in size and structure. Venous: The inferior vena cava is normal in size with greater than 50% respiratory variability, suggesting right atrial pressure of 3 mmHg. IAS/Shunts: No atrial level shunt detected by color flow Doppler.  LEFT VENTRICLE PLAX 2D LVIDd:         4.00 cm  Diastology LVIDs:         2.60 cm  LV e' medial:    4.24 cm/s LV PW:         0.80 cm  LV E/e' medial:  29.0 LV IVS:        0.80 cm  LV e' lateral:   9.25 cm/s LVOT diam:     2.00 cm  LV E/e' lateral: 13.3 LV SV:         90 LV SV Index:   54 LVOT Area:     3.14 cm  RIGHT VENTRICLE            IVC RV S prime:     9.90 cm/s  IVC diam: 1.00 cm TAPSE (M-mode): 1.7 cm LEFT ATRIUM             Index       RIGHT ATRIUM           Index LA diam:        4.00 cm 2.42 cm/m  RA Area:     13.50 cm LA Vol (A2C):   71.7 ml 43.46 ml/m RA Volume:   31.10 ml  18.85 ml/m LA Vol (A4C):   78.5 ml 47.58 ml/m LA Biplane Vol: 76.0 ml 46.06 ml/m  AORTIC VALVE AV Area (Vmax):    1.36 cm AV Area (Vmean):   1.26 cm AV Area (VTI):     1.40 cm AV Vmax:           282.50 cm/s AV Vmean:          197.000 cm/s AV VTI:            0.641 m AV Peak Grad:      31.9 mmHg AV Mean Grad:      19.0 mmHg LVOT Vmax:         122.00 cm/s LVOT Vmean:         79.000 cm/s LVOT VTI:          0.286 m LVOT/AV VTI ratio: 0.45  AORTA Ao Root diam: 3.20 cm Ao Asc diam:  3.10 cm MITRAL VALVE                TRICUSPID VALVE MV Area (PHT): 5.31 cm     TR Peak grad:   38.2 mmHg MV Decel Time: 143 msec     TR Vmax:        309.00 cm/s MV E velocity: 123.00 cm/s MV A velocity: 108.00 cm/s  SHUNTS MV E/A ratio:  1.14         Systemic VTI:  0.29 m                             Systemic Diam: 2.00 cm Loralie Champagne MD Electronically signed by Loralie Champagne MD Signature Date/Time: 03/27/2020/3:34:38 PM    Final    CT ANGIO HEAD CODE STROKE  Result Date: 03/26/2020 CLINICAL DATA:  Left parietal stroke, episodes of difficulty paraphrasing EXAM: CT ANGIOGRAPHY HEAD AND NECK CT PERFUSION BRAIN TECHNIQUE: Multidetector CT imaging of the head and neck was performed using  the standard protocol during bolus administration of intravenous contrast. Multiplanar CT image reconstructions and MIPs were obtained to evaluate the vascular anatomy. Carotid stenosis measurements (when applicable) are obtained utilizing NASCET criteria, using the distal internal carotid diameter as the denominator. Multiphase CT imaging of the brain was performed following IV bolus contrast injection. Subsequent parametric perfusion maps were calculated using RAPID software. CONTRAST:  134m OMNIPAQUE IOHEXOL 350 MG/ML SOLN COMPARISON:  Correlation made with MRI earlier same day FINDINGS: CTA NECK FINDINGS Aortic arch: Calcified plaque along the aortic arch and great vessel origins, which are patent. Right carotid system: Patent. Diffuse atherosclerotic irregularity of the common carotid. Mixed plaque at the bifurcation and proximal internal carotid causing less than 50% stenosis. Left carotid system: Patent. Diffuse atherosclerotic irregularity of the common carotid. Mixed plaque at the bifurcation and ICA origin causing less than 50% stenosis. Noncalcified plaque appears irregular. Vertebral arteries: Patent and  codominant. Plaque at the origins without high-grade stenosis. Skeleton: Degenerative changes of the cervical spine. Other neck: No mass or adenopathy. Upper chest: Right chest wall port catheter enters the SVC. No apical lung mass. Review of the MIP images confirms the above findings CTA HEAD FINDINGS Anterior circulation: Intracranial internal carotid arteries are patent with calcified plaque causing mild stenosis of the right and mild to moderate stenosis on the left. Right middle and both anterior cerebral arteries are patent. Left M1 MCA is patent. No proximal left M2 MCA occlusion identified. Left M2 MCA branch atherosclerotic irregularity and a possible M3 branch occlusion. However, evaluation is suboptimal due to presence of significant venous enhancement. Posterior circulation: Intracranial vertebral arteries are patent with atherosclerotic irregularity and mild stenosis. Basilar artery is patent. Posterior cerebral arteries are patent with atherosclerotic irregularity and areas of mild to moderate stenosis. Venous sinuses: As permitted by contrast timing, patent. Review of the MIP images confirms the above findings CT Brain Perfusion Findings: CBF (<30%) Volume: 0102mPerfusion (Tmax>6.0s) volume: 2077mismatch Volume: 35m46mfarction Location: Territory at risk is posterior to the area of infarction on MRI. IMPRESSION: Perfusion imaging demonstrates does not show infarct from MRI likely due to timing. There is no new core infarction but 20 mL of territory at risk is identified posterior to the area of restricted diffusion. There is no left M1 or proximal M2 MCA occlusion. Atherosclerotic irregularity of left M2 MCA branches and possible M3 MCA branch occlusion. Suboptimal evaluation of distal branches due to venous enhancement. Plaque at the ICA origins causing less than 50% stenosis. Noncalcified plaque at the left ICA origin as an irregular appearance. Electronically Signed   By: PranMacy Mis.   On:  03/26/2020 15:57   CT ANGIO NECK CODE STROKE  Result Date: 03/26/2020 CLINICAL DATA:  Left parietal stroke, episodes of difficulty paraphrasing EXAM: CT ANGIOGRAPHY HEAD AND NECK CT PERFUSION BRAIN TECHNIQUE: Multidetector CT imaging of the head and neck was performed using the standard protocol during bolus administration of intravenous contrast. Multiplanar CT image reconstructions and MIPs were obtained to evaluate the vascular anatomy. Carotid stenosis measurements (when applicable) are obtained utilizing NASCET criteria, using the distal internal carotid diameter as the denominator. Multiphase CT imaging of the brain was performed following IV bolus contrast injection. Subsequent parametric perfusion maps were calculated using RAPID software. CONTRAST:  100mL82mIPAQUE IOHEXOL 350 MG/ML SOLN COMPARISON:  Correlation made with MRI earlier same day FINDINGS: CTA NECK FINDINGS Aortic arch: Calcified plaque along the aortic arch and great vessel origins, which are patent. Right carotid system: Patent.  Diffuse atherosclerotic irregularity of the common carotid. Mixed plaque at the bifurcation and proximal internal carotid causing less than 50% stenosis. Left carotid system: Patent. Diffuse atherosclerotic irregularity of the common carotid. Mixed plaque at the bifurcation and ICA origin causing less than 50% stenosis. Noncalcified plaque appears irregular. Vertebral arteries: Patent and codominant. Plaque at the origins without high-grade stenosis. Skeleton: Degenerative changes of the cervical spine. Other neck: No mass or adenopathy. Upper chest: Right chest wall port catheter enters the SVC. No apical lung mass. Review of the MIP images confirms the above findings CTA HEAD FINDINGS Anterior circulation: Intracranial internal carotid arteries are patent with calcified plaque causing mild stenosis of the right and mild to moderate stenosis on the left. Right middle and both anterior cerebral arteries are  patent. Left M1 MCA is patent. No proximal left M2 MCA occlusion identified. Left M2 MCA branch atherosclerotic irregularity and a possible M3 branch occlusion. However, evaluation is suboptimal due to presence of significant venous enhancement. Posterior circulation: Intracranial vertebral arteries are patent with atherosclerotic irregularity and mild stenosis. Basilar artery is patent. Posterior cerebral arteries are patent with atherosclerotic irregularity and areas of mild to moderate stenosis. Venous sinuses: As permitted by contrast timing, patent. Review of the MIP images confirms the above findings CT Brain Perfusion Findings: CBF (<30%) Volume: 10m Perfusion (Tmax>6.0s) volume: 231mMismatch Volume: 2035mnfarction Location: Territory at risk is posterior to the area of infarction on MRI. IMPRESSION: Perfusion imaging demonstrates does not show infarct from MRI likely due to timing. There is no new core infarction but 20 mL of territory at risk is identified posterior to the area of restricted diffusion. There is no left M1 or proximal M2 MCA occlusion. Atherosclerotic irregularity of left M2 MCA branches and possible M3 MCA branch occlusion. Suboptimal evaluation of distal branches due to venous enhancement. Plaque at the ICA origins causing less than 50% stenosis. Noncalcified plaque at the left ICA origin as an irregular appearance. Electronically Signed   By: PraMacy MisD.   On: 03/26/2020 15:57        Scheduled Meds: . apixaban  5 mg Oral BID  . diltiazem  300 mg Oral Daily  . levothyroxine  50 mcg Oral QAC breakfast  . lipase/protease/amylase  72,000 Units Oral TID AC  . polyethylene glycol  17 g Oral Daily   Continuous Infusions:   LOS: 0 days   Time spent= 35 mins    Yoneko Talerico ChiArsenio LoaderD [facility]  If 7PM-7AM, please contact night-coverage  03/28/2020, 11:02 AM

## 2020-03-28 NOTE — Progress Notes (Signed)
  Speech Language Pathology Treatment: Cognitive-Linquistic  Patient Details Name: Shannon Scott MRN: [id number] DOB: [birth year] Today's Date: 03/28/2020 Time: 8469-6295 SLP Time Calculation (min) (ACUTE ONLY): 25 min  Assessment / Plan / Recommendation Clinical Impression  Patient seen with daughter present to address aphasia and provide education to patient and daughter. Patient able to respond to SLP's questions but is exhibiting mild receptive language deficit (daughter reported some question regarding hearing) and a moderate but improving expressive language deficit. Patient is exhibiting good confrontational naming but word finding deficits at sentence and conversational level, as well as phonemic paraphasias (minimal in frequency). Patient is aware of her word finding deficits but not phonemic paraphasias. As per chart review, she has been exhibiting fairly steady progress with her expressive and receptive language abilities and plan is for her to discharge home with family tomorrow. SLP is recommending [facility] SLP services and patient and daughter in agreement. SLP provided patient and daughter with education regarding location of her CVA, specific language errors she was exhibiting and some strategies and recommendations for working on this at home.    HPI HPI: 79 year old with history of CAD status post CABG, TIA, uterine cancer with colonic metastases status post colostomy, A. fib, HTN, HLD, hypothyroidism admitted for slurred speech.  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  CTA head and neck showed left M3 MCA branch occlusion.  L parietal  lobe and posterior insula impacted.      SLP Plan  Continue with current plan of care       Recommendations   [facility] SLP                Follow up Recommendations: Home health SLP;24 hour supervision/assistance SLP Visit Diagnosis: Aphasia (R47.01) Plan: Continue with current plan of care       GO                Sonia Baller, MA, [place] [facility] Acute SLP

## 2020-03-28 NOTE — Progress Notes (Signed)
Physical Therapy Treatment Patient Details Name: Shannon Scott MRN: [id number] DOB: February 04, 1941 Today's Date: 03/28/2020    History of Present Illness 79 y.o. female with PMH significant for asthma, CAD, HLD, afibb, HTN, Uterina Ca locally advanced who presents with concern for expressive aphasia, R sided headache and neck pain. She was noted to have word finding difficulty.    PT Comments    Pt tolerates treatment well with some improvement in speech production, however still with word finding difficulties and some difficulty understanding multi-step commands. Pt is able to ambulate with steady gait pattern with use of cane, daughter present and reports this appears to be close to her baseline. Pt does requires some assistance to maintain balance for stair negotiation at this time. Pt also scores 44/56 on BERG balance test, indicating a significant falls risk. Pt will continue to benefit from PT POC to improve balance and restore independence in household mobility.  Follow Up Recommendations  No PT follow up;Supervision/Assistance - 24 hour     Equipment Recommendations  None recommended by PT    Recommendations for Other Services       Precautions / Restrictions Precautions Precautions: Fall Restrictions Weight Bearing Restrictions: No    Mobility  Bed Mobility                  Transfers Overall transfer level: Needs assistance Equipment used: None Transfers: Sit to/from Stand Sit to Stand: Supervision         General transfer comment: pt impulsively stands multiple times during session  Ambulation/Gait Ambulation/Gait assistance: Supervision Gait Distance (Feet): 100 Feet Assistive device: Straight cane Gait Pattern/deviations: Step-to pattern Gait velocity: reduced Gait velocity interpretation: <1.8 ft/sec, indicate of risk for recurrent falls General Gait Details: pt with short step-to gait, intermittent increase in lateral sway   Stairs              Wheelchair Mobility    Modified Rankin (Stroke Patients Only) Modified Rankin (Stroke Patients Only) Pre-Morbid Rankin Score: Slight disability Modified Rankin: Moderately severe disability     Balance Overall balance assessment: Needs assistance Sitting-balance support: No upper extremity supported;Feet supported Sitting balance-Leahy Scale: Good Sitting balance - Comments: supervision   Standing balance support: Single extremity supported Standing balance-Leahy Scale: Poor Standing balance comment: reliant on unilateral UE support of cane                 Standardized Balance Assessment Standardized Balance Assessment : Berg Balance Test Berg Balance Test Sit to Stand: Able to stand  independently using hands Standing Unsupported: Able to stand safely 2 minutes Sitting with Back Unsupported but Feet Supported on Floor or Stool: Able to sit safely and securely 2 minutes Stand to Sit: Sits safely with minimal use of hands Transfers: Able to transfer safely, definite need of hands Standing Unsupported with Eyes Closed: Able to stand 10 seconds with supervision Standing Ubsupported with Feet Together: Able to place feet together independently and stand for 1 minute with supervision From Standing, Reach Forward with Outstretched Arm: Can reach confidently >25 cm (10") From Standing Position, Pick up Object from Floor: Able to pick up shoe, needs supervision From Standing Position, Turn to Look Behind Over each Shoulder: Looks behind from both sides and weight shifts well Turn 360 Degrees: Able to turn 360 degrees safely one side only in 4 seconds or less Standing Unsupported, Alternately Place Feet on Step/Stool: Able to complete >2 steps/needs minimal assist Standing Unsupported, One Foot in Front: Able to  take small step independently and hold 30 seconds Standing on One Leg: Able to lift leg independently and hold 5-10 seconds Total Score: 44        Cognition  Arousal/Alertness: Awake/alert Behavior During Therapy: Impulsive Overall Cognitive Status: Impaired/Different from baseline Area of Impairment: Memory;Following commands;Awareness;Problem solving                     Memory: Decreased short-term memory Following Commands: Follows one step commands consistently;Follows multi-step commands inconsistently   Awareness: Emergent Problem Solving: Slow processing;Requires verbal cues;Difficulty sequencing        Exercises      General Comments General comments (skin integrity, edema, etc.): VSS on RA      Pertinent Vitals/Pain Pain Assessment: No/denies pain    Home Living                      Prior Function            PT Goals (current goals can now be found in the care plan section) Acute Rehab PT Goals Patient Stated Goal: to return to independence Progress towards PT goals: Progressing toward goals    Frequency    Min 4X/week      PT Plan Current plan remains appropriate    Co-evaluation              AM-PAC PT "6 Clicks" Mobility   Outcome Measure  Help needed turning from your back to your side while in a flat bed without using bedrails?: None Help needed moving from lying on your back to sitting on the side of a flat bed without using bedrails?: None Help needed moving to and from a bed to a chair (including a wheelchair)?: None Help needed standing up from a chair using your arms (e.g., wheelchair or bedside chair)?: None Help needed to walk in hospital room?: None Help needed climbing 3-5 steps with a railing? : A Little 6 Click Score: 23    End of Session   Activity Tolerance: Patient tolerated treatment well Patient left: in chair;with call bell/phone within reach;with chair alarm set;with family/visitor present Nurse Communication: Mobility status PT Visit Diagnosis: Other abnormalities of gait and mobility (R26.89)     Time: 2952-8413 PT Time Calculation (min) (ACUTE  ONLY): 23 min  Charges:  $Gait Training: 8-22 mins $Neuromuscular Re-education: 8-22 mins                     Zenaida Niece, PT, DPT Acute Rehabilitation Pager: [phone]    Zenaida Niece 03/28/2020, 10:45 AM

## 2020-03-29 ENCOUNTER — Other Ambulatory Visit: Payer: Self-pay

## 2020-03-29 LAB — BASIC METABOLIC PANEL
Anion gap: 10 (ref 5–15)
BUN: 8 mg/dL (ref 8–23)
CO2: 26 mmol/L (ref 22–32)
Calcium: 9.2 mg/dL (ref 8.9–10.3)
Chloride: 105 mmol/L (ref 98–111)
Creatinine, Ser: 0.83 mg/dL (ref 0.44–1.00)
GFR, Estimated: >60 mL/min (ref >60)
Glucose, Bld: 115 mg/dL — ABNORMAL HIGH (ref 70–99)
Potassium: 3.4 mmol/L — ABNORMAL LOW (ref 3.5–5.1)
Sodium: 141 mmol/L (ref 135–145)

## 2020-03-29 LAB — MAGNESIUM: Magnesium: 1.9 mg/dL (ref 1.7–2.4)

## 2020-03-29 MED ORDER — POTASSIUM CHLORIDE 20 MEQ PO PACK
40.0000 meq | PACK | Freq: Once | ORAL | Status: AC
  Administered 2020-03-29: 40 meq via ORAL
  Filled 2020-03-29: qty 2

## 2020-03-29 MED ORDER — APIXABAN 5 MG PO TABS
5.0000 mg | ORAL_TABLET | Freq: Two times a day (BID) | ORAL | 0 refills | Status: DC
Start: 2020-03-29 — End: 2023-03-15

## 2020-03-29 MED ORDER — POTASSIUM CHLORIDE 10 MEQ/100ML IV SOLN: 10.0000 meq | INTRAVENOUS | Status: DC

## 2020-03-29 MED ORDER — SENNOSIDES-DOCUSATE SODIUM 8.6-50 MG PO TABS: 1.0000 | ORAL_TABLET | Freq: Every evening | ORAL | 0 refills | Status: DC | PRN

## 2020-03-29 MED ORDER — POLYETHYLENE GLYCOL 3350 17 G PO PACK: 17.0000 g | PACK | Freq: Every day | ORAL | 0 refills | Status: AC | PRN

## 2020-03-29 NOTE — Progress Notes (Signed)
Pt assessed gain at 0345 for the need for her prn Tylenol. Pt again resting comfortably with eyes closed.

## 2020-03-29 NOTE — Plan of Care (Signed)
Problem: Education: Goal: Knowledge of General Education information will improve Description: Including pain rating scale, medication(s)/side effects and non-pharmacologic comfort measures Outcome: Completed/Met   Problem: Health Behavior/Discharge Planning: Goal: Ability to manage health-related needs will improve Outcome: Completed/Met   Problem: Clinical Measurements: Goal: Ability to maintain clinical measurements within normal limits will improve Outcome: Completed/Met Goal: Will remain free from infection Outcome: Completed/Met Goal: Diagnostic test results will improve Outcome: Completed/Met Goal: Respiratory complications will improve Outcome: Completed/Met Goal: Cardiovascular complication will be avoided Outcome: Completed/Met   Problem: Activity: Goal: Risk for activity intolerance will decrease Outcome: Completed/Met   Problem: Nutrition: Goal: Adequate nutrition will be maintained Outcome: Completed/Met   Problem: Coping: Goal: Level of anxiety will decrease Outcome: Completed/Met   Problem: Elimination: Goal: Will not experience complications related to bowel motility Outcome: Completed/Met Goal: Will not experience complications related to urinary retention Outcome: Completed/Met   Problem: Pain Managment: Goal: General experience of comfort will improve Outcome: Completed/Met   Problem: Safety: Goal: Ability to remain free from injury will improve Outcome: Completed/Met   Problem: Skin Integrity: Goal: Risk for impaired skin integrity will decrease Outcome: Completed/Met   Problem: Education: Goal: Knowledge of disease or condition will improve Outcome: Completed/Met Goal: Knowledge of secondary prevention will improve Outcome: Completed/Met Goal: Knowledge of patient specific risk factors addressed and post discharge goals established will improve Outcome: Completed/Met Goal: Individualized Educational Video(s) Outcome: Completed/Met    Problem: Coping: Goal: Will verbalize positive feelings about self Outcome: Completed/Met Goal: Will identify appropriate support needs Outcome: Completed/Met   Problem: Health Behavior/Discharge Planning: Goal: Ability to manage health-related needs will improve Outcome: Completed/Met   Problem: Self-Care: Goal: Ability to participate in self-care as condition permits will improve Outcome: Completed/Met Goal: Verbalization of feelings and concerns over difficulty with self-care will improve Outcome: Completed/Met Goal: Ability to communicate needs accurately will improve Outcome: Completed/Met   Problem: Nutrition: Goal: Risk of aspiration will decrease Outcome: Completed/Met Goal: Dietary intake will improve Outcome: Completed/Met   Problem: Ischemic Stroke/TIA Tissue Perfusion: Goal: Complications of ischemic stroke/TIA will be minimized Outcome: Completed/Met   Problem: Acute Rehab OT Goals (only OT should resolve) Goal: Pt. Will Perform Grooming Outcome: Completed/Met Goal: Pt. Will Perform Upper Body Bathing Outcome: Completed/Met Goal: Pt. Will Perform Lower Body Bathing Outcome: Completed/Met Goal: Pt. Will Transfer To Toilet Outcome: Completed/Met Goal: Pt. Will Perform Toileting-Clothing Manipulation Outcome: Completed/Met Goal: Pt. Will Perform Tub/Shower Transfer Outcome: Completed/Met   Problem: SLP Language Goals Goal: Patient will communicate needs/wants with Outcome: Completed/Met Goal: Patient will follow commands during a functional ADL with Outcome: Completed/Met Goal: Misc Speech Goal #1 Outcome: Completed/Met Goal: Misc Speech Goal #2 Outcome: Completed/Met Goal: Misc Speech Goal #3 Outcome: Completed/Met   Problem: Acute Rehab PT Goals(only PT should resolve) Goal: Patient Will Transfer Sit To/From Stand Outcome: Completed/Met Goal: Pt Will Transfer Bed To Chair/Chair To Bed Outcome: Completed/Met Goal: Pt Will Ambulate Outcome:  Completed/Met Goal: Pt Will Go Up/Down Stairs Outcome: Completed/Met   Discharge instructions reviewed with patient and with her son.  MD also in room to answer questions son has, particularly re: Apixaban medication.  This was answered to the son's satisfaction.  Son also had concern re:  left leg chronic lymphedema.  These were also answered by MD.  Relative discharge information included, but was not limited to, the following:  medication list (including time for next dose), when to call the MD, follow-up appointments, s&s of CVA, bowel management, and necessity for monitoring lipid levels with PCP.  Pt discharged to private residence via private vehicle by son.  Escorted to exit via wheelchair accompanied by nurse tech.

## 2020-03-29 NOTE — Plan of Care (Signed)

## 2020-03-29 NOTE — Progress Notes (Signed)
Pt assessed for the need for prn Tylenol, pt resting with eyes closed comfortably at this time.

## 2020-03-29 NOTE — Discharge Summary (Signed)
Physician Discharge Summary  Shannon Scott ZES:[id number] DOB: Mar 10, 1941 DOA: 03/26/2020  PCP: Leeroy Cha, MD  Admit date: 03/26/2020 Discharge date: 03/29/2020  Admitted From: Home Disposition: Home   Recommendations for Outpatient Follow-up:  1. Follow up with PCP in 1-2 weeks 2. Please obtain BMP/CBC in one week your next doctors visit.  3. Start Eliquis 5 mg twice daily 4. Follow-up outpatient neurology in 3-4 weeks 5. Follow-up outpatient cardiology for PCSK9 inhibitor injections due to elevated cholesterol levels  Home Health: PT/OT/speech Discharge Condition: Stable CODE STATUS: Full code Diet recommendation: heart healthy  Brief/Interim Summary: 79 year old with history of CAD status post CABG, TIA, uterine cancer with colonic metastases status post colostomy, A. fib, HTN, HLD, hypothyroidism admitted for slurred speech.  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  CTA head and neck showed left M3 MCA branch occlusion.  A1c was 5.6, LDL 358.  Seen by neurology recommended starting patient on Eliquis due to concerns of A. fib with outpatient follow-up.  Patient stable to be discharged with recommendations as stated above.   Assessment & Plan:   Principal Problem:   Expressive aphasia Active Problems:   Essential hypertension   Atrial fibrillation, chronic (HCC)   Stroke (HCC)   Acute CVA (cerebrovascular accident) ([place])  Slurred speech Acute left parietal and posterior insular infarct -MRI showed acute left parietal/posterior insular infarct, CTA head and neck showed left M3 MCA branch occlusion.  Neurology team following.  Case discussed by neuro interventional list, not a candidate for thrombectomy. -Echocardiogram-EF 60 to 26%, grade 2 diastolic dysfunction, moderate AS -LDL 358, will need P SK 9 inhibitor outpatient due to allergy to statin -A1c 5.7 -PT/OT-home health -Will be on Eliquis due to history of atrial fibrillation  in the records  Essential hypertension, uncontrolled -On diltiazem.  Will allow blood pressure to go as high as 78 for permissive hypertension.  History of uterine cancer with colonic metastases status post colostomy -Stable  Constipation -Bowel regimen including MiraLAX ordered  CAD status post CABG -Currently chest pain-free.  Hypothyroidism -Synthroid 50 mcg daily   Body mass index is 27.49 kg/m.         Discharge Diagnoses:  Principal Problem:   Expressive aphasia Active Problems:   Essential hypertension   Atrial fibrillation, chronic (HCC)   Stroke ([place])   Acute CVA (cerebrovascular accident) ([place])    Subjective: No complaints feels great.  Had large bowel movement through her ostomy bag overnight.  Lower extremity edema better  Discharge Exam: Vitals:   03/29/20 0348 03/29/20 0809  BP: (!) 186/76 (!) 188/69  Pulse: 77 72  Resp: 14   Temp: 97.6 F (36.4 C) 98.5 F (36.9 C)  SpO2: 96% 98%   Vitals:   03/28/20 2040 03/28/20 2324 03/29/20 0348 03/29/20 0809  BP: (!) 160/65 (!) 181/70 (!) 186/76 (!) 188/69  Pulse: 73 79 77 72  Resp: 17 (!) 23 14   Temp: 98.1 F (36.7 C) 98.2 F (36.8 C) 97.6 F (36.4 C) 98.5 F (36.9 C)  TempSrc: Oral Oral Oral Oral  SpO2: 96% 96% 96% 98%  Weight:      Height:        General: Pt is alert, awake, not in acute distress Cardiovascular: RRR, S1/S2 +, no rubs, no gallops Respiratory: CTA bilaterally, no wheezing, no rhonchi Abdominal: Soft, NT, ND, bowel sounds + Extremities: no edema, no cyanosis Ostomy bag noted  Discharge Instructions  Discharge Instructions    Ambulatory referral to Neurology  Complete by: As directed    Follow up in stroke clinic at [name] with Frann Rider, NP in about 4 weeks. If not available, consider Dr. Antony Contras, Dr. Bess Harvest, or Dr. Sarina Ill.     Allergies as of 03/29/2020      Reactions   Statins Other (See Comments)    Myalgias and memory problems   Ciprofloxacin Itching   Splotchy redness with itching during IV infusion localized to arm.      Medication List    TAKE these medications   apixaban 5 MG Tabs tablet Commonly known as: ELIQUIS Take 1 tablet (5 mg total) by mouth 2 (two) times daily.   diltiazem 300 MG 24 hr capsule Commonly known as: TIAZAC TAKE 1 CAPSULE(300 MG) BY MOUTH DAILY What changed: See the new instructions.   ibuprofen 200 MG tablet Commonly known as: ADVIL Take 200 mg by mouth daily as needed (back pain).   levothyroxine 50 MCG tablet Commonly known as: SYNTHROID Take 1 tablet (50 mcg total) by mouth daily before breakfast.   lipase/protease/amylase 36000 UNITS Cpep capsule Commonly known as: Creon Take 2 capsules (72,000 Units total) by mouth 3 (three) times daily before meals.   ondansetron 8 MG tablet Commonly known as: ZOFRAN TAKE 1 TABLET(8 MG) BY MOUTH EVERY 8 HOURS AS NEEDED FOR NAUSEA OR VOMITING   polyethylene glycol 17 g packet Commonly known as: MIRALAX / GLYCOLAX Take 17 g by mouth daily as needed for severe constipation.   senna-docusate 8.6-50 MG tablet Commonly known as: Senokot-S Take 1 tablet by mouth at bedtime as needed for moderate constipation.   traMADol 50 MG tablet Commonly known as: ULTRAM TAKE 2 TABLETS(100 MG) BY MOUTH TWICE DAILY What changed: See the new instructions.   triamcinolone ointment 0.5 % Commonly known as: KENALOG Apply topically daily. Use for 2 weeks       Follow-up Information    [facility] Follow up in 4 week(s).   Specialty: Neurology Why: stroke clinic. office will call with appt date and time. Contact information: [address] [place] [phone]       Leeroy Cha, MD. Schedule an appointment as soon as possible for a visit in 1 week(s).   Specialty: Internal Medicine Contact information: [address] [place]  [place] [address] [phone]        Pixie Casino, MD .   Specialty: Cardiology Contact information: [address] [place] [address] [phone]              Allergies  Allergen Reactions  . Statins Other (See Comments)    Myalgias and memory problems  . Ciprofloxacin Itching    Splotchy redness with itching during IV infusion localized to arm.    You were cared for by a hospitalist during your hospital stay. If you have any questions about your discharge medications or the care you received while you were in the hospital after you are discharged, you can call the unit and asked to speak with the hospitalist on call if the hospitalist that took care of you is not available. Once you are discharged, your primary care physician will handle any further medical issues. Please note that no refills for any discharge medications will be authorized once you are discharged, as it is imperative that you return to your primary care physician (or establish a relationship with a primary care physician if you do not have one)  for your aftercare needs so that they can reassess your need for medications and monitor your lab values.   Procedures/Studies: CT Head Wo Contrast  Result Date: 03/26/2020 CLINICAL DATA:  Neuro deficit, acute, stroke suspected; aphasia, headache, right neck pain. EXAM: CT HEAD WITHOUT CONTRAST TECHNIQUE: Contiguous axial images were obtained from the base of the skull through the vertex without intravenous contrast. COMPARISON:  Prior head CT 10/06/2010. FINDINGS: Brain: Mild generalized cerebral atrophy. Age-indeterminate lacunar infarct within the right basal ganglia (series 2, image 12) (series 5, image 32). Redemonstrated prominent perivascular space versus chronic lacunar infarct within the right subinsular white matter (series 2, image 11). There is no acute intracranial hemorrhage. No demarcated cortical infarct. No extra-axial fluid collection. No  evidence of intracranial mass. No midline shift. Vascular: No hyperdense vessel.  Atherosclerotic calcifications. Skull: Normal. Negative for fracture or focal lesion. Sinuses/Orbits: Visualized orbits show no acute finding. No significant paranasal sinus disease or mastoid effusion at the imaged levels IMPRESSION: No evidence of acute intracranial hemorrhage or acute demarcated cortical infarction. Right basal ganglia lacunar infarct, new as compared to the head CT of 10/06/2010, but otherwise age indeterminate. Redemonstrated prominent perivascular space versus chronic lacunar infarct within the right subinsular white matter. Mild cerebral atrophy. Electronically Signed   By: Kellie Simmering DO   On: 03/26/2020 12:19   CT Chest W Contrast  Result Date: 03/17/2020 CLINICAL DATA:  Follow-up uterine cancer diagnosed 2019. Last chemotherapy August 2021. Radiation therapy complete. Surgical history of colostomy and hysterectomy. EXAM: CT CHEST, ABDOMEN, AND PELVIS WITH CONTRAST TECHNIQUE: Multidetector CT imaging of the chest, abdomen and pelvis was performed following the standard protocol during bolus administration of intravenous contrast. CONTRAST:  166m OMNIPAQUE IOHEXOL 300 MG/ML  SOLN COMPARISON:  CT 09/27/2019 FINDINGS: CT CHEST FINDINGS Cardiovascular: Port in the anterior chest wall with tip in distal SVC. Post CABG Mediastinum/Nodes: No axillary or supraclavicular adenopathy. No mediastinal or hilar adenopathy. No pericardial fluid. Esophagus normal. Lungs/Pleura: No suspicious pulmonary nodules.  Airways normal. Musculoskeletal: No aggressive osseous lesion. CT ABDOMEN PELVIS FINDINGS Hepatobiliary: Fatty infiltration along the falciform ligament. Gallbladder contains several gallstones. No acute inflammation. Pancreas: Normal pancreas Spleen:  normal spleen Adrenals/Urinary Tract: Adrenal glands, kidneys and ureters normal. Stomach/Bowel: Stomach, small-bowel and cecum normal. Long segment of small  bowel enters a LEFT mid abdominal wall peristomal hernia. No evidence of bowel obstruction. Transverse colon exits the LEFT midline colostomy. Excluded LEFT colon appears normal. Vascular/Lymphatic: Abdominal aorta is normal caliber with atherosclerotic calcification. There is no retroperitoneal or periportal lymphadenopathy. No pelvic lymphadenopathy. Infrarenal IVC filter noted. Reproductive: Post hysterectomy. No pelvic sidewall nodularity. No pelvic lymphadenopathy. Other: No peritoneal metastasis.  No peritoneal fluid Musculoskeletal: No aggressive osseous lesion. Sclerosis along the SI joints. Chronic insufficiency fractures through the sacrum. Remote fracture of the RIGHT inferior pubic ramus at the symphysis. IMPRESSION: 1. No evidence of cervical cancer local recurrence or metastatic disease. 2. LEFT midline colostomy with peristomal hernia. Long segment small bowel enters the hernia sac without obstruction. 3. Chronic fractures sacral insufficiency fractures Electronically Signed   By: SSuzy BouchardM.D.   On: 03/17/2020 12:29   MR BRAIN WO CONTRAST  Result Date: 03/26/2020 CLINICAL DATA:  Neuro deficit, acute stroke suspected. EXAM: MRI HEAD WITHOUT CONTRAST TECHNIQUE: Multiplanar, multiecho pulse sequences of the brain and surrounding structures were obtained without intravenous contrast. COMPARISON:  Same day head CT. FINDINGS: Brain: There is restricted diffusion involving the inferior left parietal lobe and posterior aspect of the insula.  There is mild associated T2/FLAIR hyperintensity without substantial mass effect. Additional scattered T2/FLAIR hyperintensities in the white matter, compatible with chronic microvascular ischemic disease. Mild cerebral volume loss. No hydrocephalus. No acute hemorrhage. No mass lesion or abnormal mass effect. Remote lacunar infarcts in the right basal ganglia. Vascular: Major arterial flow voids are maintained at the skull base. Skull and upper cervical  spine: Normal marrow signal. Sinuses/Orbits: Sinuses are clear.  Unremarkable orbits. Other: No mastoid effusions. IMPRESSION: 1. Acute infarct in the inferior left parietal lobe and posterior insula. Mild edema without substantial mass effect. 2. Chronic microvascular ischemic disease and remote lacunar infarcts in the right basal ganglia. Electronically Signed   By: Margaretha Sheffield MD   On: 03/26/2020 13:27   CT Abdomen Pelvis W Contrast  Result Date: 03/17/2020 CLINICAL DATA:  Follow-up uterine cancer diagnosed 2019. Last chemotherapy August 2021. Radiation therapy complete. Surgical history of colostomy and hysterectomy. EXAM: CT CHEST, ABDOMEN, AND PELVIS WITH CONTRAST TECHNIQUE: Multidetector CT imaging of the chest, abdomen and pelvis was performed following the standard protocol during bolus administration of intravenous contrast. CONTRAST:  115m OMNIPAQUE IOHEXOL 300 MG/ML  SOLN COMPARISON:  CT 09/27/2019 FINDINGS: CT CHEST FINDINGS Cardiovascular: Port in the anterior chest wall with tip in distal SVC. Post CABG Mediastinum/Nodes: No axillary or supraclavicular adenopathy. No mediastinal or hilar adenopathy. No pericardial fluid. Esophagus normal. Lungs/Pleura: No suspicious pulmonary nodules.  Airways normal. Musculoskeletal: No aggressive osseous lesion. CT ABDOMEN PELVIS FINDINGS Hepatobiliary: Fatty infiltration along the falciform ligament. Gallbladder contains several gallstones. No acute inflammation. Pancreas: Normal pancreas Spleen:  normal spleen Adrenals/Urinary Tract: Adrenal glands, kidneys and ureters normal. Stomach/Bowel: Stomach, small-bowel and cecum normal. Long segment of small bowel enters a LEFT mid abdominal wall peristomal hernia. No evidence of bowel obstruction. Transverse colon exits the LEFT midline colostomy. Excluded LEFT colon appears normal. Vascular/Lymphatic: Abdominal aorta is normal caliber with atherosclerotic calcification. There is no retroperitoneal or  periportal lymphadenopathy. No pelvic lymphadenopathy. Infrarenal IVC filter noted. Reproductive: Post hysterectomy. No pelvic sidewall nodularity. No pelvic lymphadenopathy. Other: No peritoneal metastasis.  No peritoneal fluid Musculoskeletal: No aggressive osseous lesion. Sclerosis along the SI joints. Chronic insufficiency fractures through the sacrum. Remote fracture of the RIGHT inferior pubic ramus at the symphysis. IMPRESSION: 1. No evidence of cervical cancer local recurrence or metastatic disease. 2. LEFT midline colostomy with peristomal hernia. Long segment small bowel enters the hernia sac without obstruction. 3. Chronic fractures sacral insufficiency fractures Electronically Signed   By: SSuzy BouchardM.D.   On: 03/17/2020 12:29   CT CEREBRAL PERFUSION W CONTRAST  Result Date: 03/26/2020 CLINICAL DATA:  Left parietal stroke, episodes of difficulty paraphrasing EXAM: CT ANGIOGRAPHY HEAD AND NECK CT PERFUSION BRAIN TECHNIQUE: Multidetector CT imaging of the head and neck was performed using the standard protocol during bolus administration of intravenous contrast. Multiplanar CT image reconstructions and MIPs were obtained to evaluate the vascular anatomy. Carotid stenosis measurements (when applicable) are obtained utilizing NASCET criteria, using the distal internal carotid diameter as the denominator. Multiphase CT imaging of the brain was performed following IV bolus contrast injection. Subsequent parametric perfusion maps were calculated using RAPID software. CONTRAST:  1040mOMNIPAQUE IOHEXOL 350 MG/ML SOLN COMPARISON:  Correlation made with MRI earlier same day FINDINGS: CTA NECK FINDINGS Aortic arch: Calcified plaque along the aortic arch and great vessel origins, which are patent. Right carotid system: Patent. Diffuse atherosclerotic irregularity of the common carotid. Mixed plaque at the bifurcation and proximal internal carotid causing less than 50%  stenosis. Left carotid system:  Patent. Diffuse atherosclerotic irregularity of the common carotid. Mixed plaque at the bifurcation and ICA origin causing less than 50% stenosis. Noncalcified plaque appears irregular. Vertebral arteries: Patent and codominant. Plaque at the origins without high-grade stenosis. Skeleton: Degenerative changes of the cervical spine. Other neck: No mass or adenopathy. Upper chest: Right chest wall port catheter enters the SVC. No apical lung mass. Review of the MIP images confirms the above findings CTA HEAD FINDINGS Anterior circulation: Intracranial internal carotid arteries are patent with calcified plaque causing mild stenosis of the right and mild to moderate stenosis on the left. Right middle and both anterior cerebral arteries are patent. Left M1 MCA is patent. No proximal left M2 MCA occlusion identified. Left M2 MCA branch atherosclerotic irregularity and a possible M3 branch occlusion. However, evaluation is suboptimal due to presence of significant venous enhancement. Posterior circulation: Intracranial vertebral arteries are patent with atherosclerotic irregularity and mild stenosis. Basilar artery is patent. Posterior cerebral arteries are patent with atherosclerotic irregularity and areas of mild to moderate stenosis. Venous sinuses: As permitted by contrast timing, patent. Review of the MIP images confirms the above findings CT Brain Perfusion Findings: CBF (<30%) Volume: 58m Perfusion (Tmax>6.0s) volume: 224mMismatch Volume: 2039mnfarction Location: Territory at risk is posterior to the area of infarction on MRI. IMPRESSION: Perfusion imaging demonstrates does not show infarct from MRI likely due to timing. There is no new core infarction but 20 mL of territory at risk is identified posterior to the area of restricted diffusion. There is no left M1 or proximal M2 MCA occlusion. Atherosclerotic irregularity of left M2 MCA branches and possible M3 MCA branch occlusion. Suboptimal evaluation of distal  branches due to venous enhancement. Plaque at the ICA origins causing less than 50% stenosis. Noncalcified plaque at the left ICA origin as an irregular appearance. Electronically Signed   By: PraMacy MisD.   On: 03/26/2020 15:57   ECHOCARDIOGRAM COMPLETE  Result Date: 03/27/2020    ECHOCARDIOGRAM REPORT   Patient Name:   EDIMARILLYN GORENte of Exam: 03/27/2020 Medical Rec #:  [id number]      Height:       61.0 in Accession #:    [id number]     Weight:       145.5 lb Date of Birth:  6/311-14-1942       BSA:          1.650 m Patient Age:    [age]          BP:           193/84 mmHg Patient Gender: F                 HR:           80 bpm. Exam Location:  Inpatient Procedure: 2D Echo Indications:    stroke 434.91  History:        Patient has prior history of Echocardiogram examinations, most                 recent 10/04/2019. Prior CABG, Stroke, Arrythmias:Atrial                 Fibrillation; Risk Factors:Hypertension.  Sonographer:    LauJohny Chessferring Phys: [id number]. Left ventricular ejection fraction, by estimation, is 60 to 65%. The left ventricle has normal function. The left ventricle has no regional wall motion  abnormalities. There is mild left ventricular hypertrophy. Left ventricular diastolic parameters are consistent with Grade II diastolic dysfunction (pseudonormalization).  2. Right ventricular systolic function is normal. The right ventricular size is normal. There is mildly elevated pulmonary artery systolic pressure. The estimated right ventricular systolic pressure is 81.2 mmHg.  3. Left atrial size was moderately dilated.  4. The mitral valve is normal in structure. Mild mitral valve regurgitation. No evidence of mitral stenosis. Moderate to severe mitral annular calcification.  5. The aortic valve is tricuspid. Aortic valve regurgitation is not visualized. Moderate aortic valve stenosis. Aortic valve area, by VTI measures 1.40 cm. Aortic valve  mean gradient measures 19.0 mmHg.  6. The inferior vena cava is normal in size with greater than 50% respiratory variability, suggesting right atrial pressure of 3 mmHg. FINDINGS  Left Ventricle: Left ventricular ejection fraction, by estimation, is 60 to 65%. The left ventricle has normal function. The left ventricle has no regional wall motion abnormalities. The left ventricular internal cavity size was normal in size. There is  mild left ventricular hypertrophy. Left ventricular diastolic parameters are consistent with Grade II diastolic dysfunction (pseudonormalization). Right Ventricle: The right ventricular size is normal. No increase in right ventricular wall thickness. Right ventricular systolic function is normal. There is mildly elevated pulmonary artery systolic pressure. The tricuspid regurgitant velocity is 3.09  m/s, and with an assumed right atrial pressure of 3 mmHg, the estimated right ventricular systolic pressure is 75.1 mmHg. Left Atrium: Left atrial size was moderately dilated. Right Atrium: Right atrial size was normal in size. Pericardium: There is no evidence of pericardial effusion. Mitral Valve: The mitral valve is normal in structure. There is moderate calcification of the mitral valve leaflet(s). Moderate to severe mitral annular calcification. Mild mitral valve regurgitation. No evidence of mitral valve stenosis. Tricuspid Valve: The tricuspid valve is normal in structure. Tricuspid valve regurgitation is mild. Aortic Valve: The aortic valve is tricuspid. Aortic valve regurgitation is not visualized. Moderate aortic stenosis is present. Aortic valve mean gradient measures 19.0 mmHg. Aortic valve peak gradient measures 31.9 mmHg. Aortic valve area, by VTI measures 1.40 cm. Pulmonic Valve: The pulmonic valve was normal in structure. Pulmonic valve regurgitation is not visualized. Aorta: The aortic root is normal in size and structure. Venous: The inferior vena cava is normal in size with  greater than 50% respiratory variability, suggesting right atrial pressure of 3 mmHg. IAS/Shunts: No atrial level shunt detected by color flow Doppler.  LEFT VENTRICLE PLAX 2D LVIDd:         4.00 cm  Diastology LVIDs:         2.60 cm  LV e' medial:    4.24 cm/s LV PW:         0.80 cm  LV E/e' medial:  29.0 LV IVS:        0.80 cm  LV e' lateral:   9.25 cm/s LVOT diam:     2.00 cm  LV E/e' lateral: 13.3 LV SV:         90 LV SV Index:   54 LVOT Area:     3.14 cm  RIGHT VENTRICLE            IVC RV S prime:     9.90 cm/s  IVC diam: 1.00 cm TAPSE (M-mode): 1.7 cm LEFT ATRIUM             Index       RIGHT ATRIUM  Index LA diam:        4.00 cm 2.42 cm/m  RA Area:     13.50 cm LA Vol (A2C):   71.7 ml 43.46 ml/m RA Volume:   31.10 ml  18.85 ml/m LA Vol (A4C):   78.5 ml 47.58 ml/m LA Biplane Vol: 76.0 ml 46.06 ml/m  AORTIC VALVE AV Area (Vmax):    1.36 cm AV Area (Vmean):   1.26 cm AV Area (VTI):     1.40 cm AV Vmax:           282.50 cm/s AV Vmean:          197.000 cm/s AV VTI:            0.641 m AV Peak Grad:      31.9 mmHg AV Mean Grad:      19.0 mmHg LVOT Vmax:         122.00 cm/s LVOT Vmean:        79.000 cm/s LVOT VTI:          0.286 m LVOT/AV VTI ratio: 0.45  AORTA Ao Root diam: 3.20 cm Ao Asc diam:  3.10 cm MITRAL VALVE                TRICUSPID VALVE MV Area (PHT): 5.31 cm     TR Peak grad:   38.2 mmHg MV Decel Time: 143 msec     TR Vmax:        309.00 cm/s MV E velocity: 123.00 cm/s MV A velocity: 108.00 cm/s  SHUNTS MV E/A ratio:  1.14         Systemic VTI:  0.29 m                             Systemic Diam: 2.00 cm Loralie Champagne MD Electronically signed by Loralie Champagne MD Signature Date/Time: 03/27/2020/3:34:38 PM    Final    VAS [place] LOWER EXTREMITY VENOUS (DVT)  Result Date: 03/04/2020  Lower Venous DVTStudy Indications: Swelling.  Comparison Study: 08/04/2018: Left- No DVT, limited due to body habitus Performing Technologist: Ivan Croft  Examination Guidelines: A complete evaluation  includes B-mode imaging, spectral Doppler, color Doppler, and power Doppler as needed of all accessible portions of each vessel. Bilateral testing is considered an integral part of a complete examination. Limited examinations for reoccurring indications may be performed as noted. The reflux portion of the exam is performed with the patient in reverse Trendelenburg.  +-----+---------------+---------+-----------+----------+--------------+ RIGHTCompressibilityPhasicitySpontaneityPropertiesThrombus Aging +-----+---------------+---------+-----------+----------+--------------+ CFV  Full                                                        +-----+---------------+---------+-----------+----------+--------------+   +---------+---------------+---------+-----------+----------+--------------+ LEFT     CompressibilityPhasicitySpontaneityPropertiesThrombus Aging +---------+---------------+---------+-----------+----------+--------------+ CFV      Full                                                        +---------+---------------+---------+-----------+----------+--------------+ SFJ      Full                                                        +---------+---------------+---------+-----------+----------+--------------+  FV Prox  Full                                                        +---------+---------------+---------+-----------+----------+--------------+ FV Mid   Full                                                        +---------+---------------+---------+-----------+----------+--------------+ FV DistalFull                                                        +---------+---------------+---------+-----------+----------+--------------+ PFV      Full                                                        +---------+---------------+---------+-----------+----------+--------------+ POP      Full                                                         +---------+---------------+---------+-----------+----------+--------------+ PTV      Full                                                        +---------+---------------+---------+-----------+----------+--------------+ PERO     Full                                                        +---------+---------------+---------+-----------+----------+--------------+ Gastroc  Full                                                        +---------+---------------+---------+-----------+----------+--------------+ Limited visualization in the calf due to edema.    Summary: RIGHT: - No evidence of common femoral vein obstruction.  LEFT: - There is no evidence of deep vein thrombosis in the lower extremity.  - No cystic structure found in the popliteal fossa. - Results were called to Dr. Jacqualyn Posey (03/04/2020 10:20am)  *See table(s) above for measurements and observations. Electronically signed by Monica Martinez MD on 03/04/2020 at 1:14:55 PM.    Final    CT ANGIO HEAD CODE STROKE  Result Date: 03/26/2020 CLINICAL DATA:  Left parietal stroke, episodes of difficulty paraphrasing EXAM: CT ANGIOGRAPHY HEAD AND NECK CT PERFUSION BRAIN TECHNIQUE:  Multidetector CT imaging of the head and neck was performed using the standard protocol during bolus administration of intravenous contrast. Multiplanar CT image reconstructions and MIPs were obtained to evaluate the vascular anatomy. Carotid stenosis measurements (when applicable) are obtained utilizing NASCET criteria, using the distal internal carotid diameter as the denominator. Multiphase CT imaging of the brain was performed following IV bolus contrast injection. Subsequent parametric perfusion maps were calculated using RAPID software. CONTRAST:  156m OMNIPAQUE IOHEXOL 350 MG/ML SOLN COMPARISON:  Correlation made with MRI earlier same day FINDINGS: CTA NECK FINDINGS Aortic arch: Calcified plaque along the aortic arch and great vessel origins, which are  patent. Right carotid system: Patent. Diffuse atherosclerotic irregularity of the common carotid. Mixed plaque at the bifurcation and proximal internal carotid causing less than 50% stenosis. Left carotid system: Patent. Diffuse atherosclerotic irregularity of the common carotid. Mixed plaque at the bifurcation and ICA origin causing less than 50% stenosis. Noncalcified plaque appears irregular. Vertebral arteries: Patent and codominant. Plaque at the origins without high-grade stenosis. Skeleton: Degenerative changes of the cervical spine. Other neck: No mass or adenopathy. Upper chest: Right chest wall port catheter enters the SVC. No apical lung mass. Review of the MIP images confirms the above findings CTA HEAD FINDINGS Anterior circulation: Intracranial internal carotid arteries are patent with calcified plaque causing mild stenosis of the right and mild to moderate stenosis on the left. Right middle and both anterior cerebral arteries are patent. Left M1 MCA is patent. No proximal left M2 MCA occlusion identified. Left M2 MCA branch atherosclerotic irregularity and a possible M3 branch occlusion. However, evaluation is suboptimal due to presence of significant venous enhancement. Posterior circulation: Intracranial vertebral arteries are patent with atherosclerotic irregularity and mild stenosis. Basilar artery is patent. Posterior cerebral arteries are patent with atherosclerotic irregularity and areas of mild to moderate stenosis. Venous sinuses: As permitted by contrast timing, patent. Review of the MIP images confirms the above findings CT Brain Perfusion Findings: CBF (<30%) Volume: 038mPerfusion (Tmax>6.0s) volume: 2079mismatch Volume: 84m45mfarction Location: Territory at risk is posterior to the area of infarction on MRI. IMPRESSION: Perfusion imaging demonstrates does not show infarct from MRI likely due to timing. There is no new core infarction but 20 mL of territory at risk is identified  posterior to the area of restricted diffusion. There is no left M1 or proximal M2 MCA occlusion. Atherosclerotic irregularity of left M2 MCA branches and possible M3 MCA branch occlusion. Suboptimal evaluation of distal branches due to venous enhancement. Plaque at the ICA origins causing less than 50% stenosis. Noncalcified plaque at the left ICA origin as an irregular appearance. Electronically Signed   By: PranMacy Mis.   On: 03/26/2020 15:57   CT ANGIO NECK CODE STROKE  Result Date: 03/26/2020 CLINICAL DATA:  Left parietal stroke, episodes of difficulty paraphrasing EXAM: CT ANGIOGRAPHY HEAD AND NECK CT PERFUSION BRAIN TECHNIQUE: Multidetector CT imaging of the head and neck was performed using the standard protocol during bolus administration of intravenous contrast. Multiplanar CT image reconstructions and MIPs were obtained to evaluate the vascular anatomy. Carotid stenosis measurements (when applicable) are obtained utilizing NASCET criteria, using the distal internal carotid diameter as the denominator. Multiphase CT imaging of the brain was performed following IV bolus contrast injection. Subsequent parametric perfusion maps were calculated using RAPID software. CONTRAST:  100mL78mIPAQUE IOHEXOL 350 MG/ML SOLN COMPARISON:  Correlation made with MRI earlier same day FINDINGS: CTA NECK FINDINGS Aortic arch: Calcified plaque along the aortic arch  and great vessel origins, which are patent. Right carotid system: Patent. Diffuse atherosclerotic irregularity of the common carotid. Mixed plaque at the bifurcation and proximal internal carotid causing less than 50% stenosis. Left carotid system: Patent. Diffuse atherosclerotic irregularity of the common carotid. Mixed plaque at the bifurcation and ICA origin causing less than 50% stenosis. Noncalcified plaque appears irregular. Vertebral arteries: Patent and codominant. Plaque at the origins without high-grade stenosis. Skeleton: Degenerative changes  of the cervical spine. Other neck: No mass or adenopathy. Upper chest: Right chest wall port catheter enters the SVC. No apical lung mass. Review of the MIP images confirms the above findings CTA HEAD FINDINGS Anterior circulation: Intracranial internal carotid arteries are patent with calcified plaque causing mild stenosis of the right and mild to moderate stenosis on the left. Right middle and both anterior cerebral arteries are patent. Left M1 MCA is patent. No proximal left M2 MCA occlusion identified. Left M2 MCA branch atherosclerotic irregularity and a possible M3 branch occlusion. However, evaluation is suboptimal due to presence of significant venous enhancement. Posterior circulation: Intracranial vertebral arteries are patent with atherosclerotic irregularity and mild stenosis. Basilar artery is patent. Posterior cerebral arteries are patent with atherosclerotic irregularity and areas of mild to moderate stenosis. Venous sinuses: As permitted by contrast timing, patent. Review of the MIP images confirms the above findings CT Brain Perfusion Findings: CBF (<30%) Volume: 29m Perfusion (Tmax>6.0s) volume: 241mMismatch Volume: 2049mnfarction Location: Territory at risk is posterior to the area of infarction on MRI. IMPRESSION: Perfusion imaging demonstrates does not show infarct from MRI likely due to timing. There is no new core infarction but 20 mL of territory at risk is identified posterior to the area of restricted diffusion. There is no left M1 or proximal M2 MCA occlusion. Atherosclerotic irregularity of left M2 MCA branches and possible M3 MCA branch occlusion. Suboptimal evaluation of distal branches due to venous enhancement. Plaque at the ICA origins causing less than 50% stenosis. Noncalcified plaque at the left ICA origin as an irregular appearance. Electronically Signed   By: PraMacy MisD.   On: 03/26/2020 15:57      The results of significant diagnostics from this hospitalization  (including imaging, microbiology, ancillary and laboratory) are listed below for reference.     Microbiology: Recent Results (from the past 240 hour(s))  Respiratory Panel by RT PCR (Flu A&B, Covid) - Nasopharyngeal Swab     Status: None   Collection Time: 03/26/20  3:10 PM   Specimen: Nasopharyngeal Swab  Result Value Ref Range Status   SARS Coronavirus 2 by RT PCR NEGATIVE NEGATIVE Final    Comment: (NOTE) SARS-CoV-2 target nucleic acids are NOT DETECTED.  The SARS-CoV-2 RNA is generally detectable in upper respiratoy specimens during the acute phase of infection. The lowest concentration of SARS-CoV-2 viral copies this assay can detect is 131 copies/mL. A negative result does not preclude SARS-Cov-2 infection and should not be used as the sole basis for treatment or other patient management decisions. A negative result may occur with  improper specimen collection/handling, submission of specimen other than nasopharyngeal swab, presence of viral mutation(s) within the areas targeted by this assay, and inadequate number of viral copies (<131 copies/mL). A negative result must be combined with clinical observations, patient history, and epidemiological information. The expected result is Negative.  Fact Sheet for Patients:  httPinkCheek.beact Sheet for Healthcare Providers:  httGravelBags.ithis test is no t yet approved or cleared by the [place]  has been authorized for detection and/or diagnosis of SARS-CoV-2 by FDA under an Emergency Use Authorization (EUA). This EUA will remain  in effect (meaning this test can be used) for the duration of the COVID-19 declaration under Section 564(b)(1) of the Act, 21 U.S.C. section 360bbb-3(b)(1), unless the authorization is terminated or revoked sooner.     Influenza A by PCR NEGATIVE NEGATIVE Final   Influenza B by PCR NEGATIVE NEGATIVE Final    Comment:  (NOTE) The Xpert Xpress SARS-CoV-2/FLU/RSV assay is intended as an aid in  the diagnosis of influenza from Nasopharyngeal swab specimens and  should not be used as a sole basis for treatment. Nasal washings and  aspirates are unacceptable for Xpert Xpress SARS-CoV-2/FLU/RSV  testing.  Fact Sheet for Patients: PinkCheek.be  Fact Sheet for Healthcare Providers: GravelBags.it  This test is not yet approved or cleared by the [place] FDA and  has been authorized for detection and/or diagnosis of SARS-CoV-2 by  FDA under an Emergency Use Authorization (EUA). This EUA will remain  in effect (meaning this test can be used) for the duration of the  Covid-19 declaration under Section 564(b)(1) of the Act, 21  U.S.C. section 360bbb-3(b)(1), unless the authorization is  terminated or revoked. Performed at [facility], [place] [address] [place], [place] [address]      Labs: BNP (last 3 results) No results for input(s): BNP in the last 8760 hours. Basic Metabolic Panel: Recent Labs  Lab 03/26/20 1142 03/26/20 1145 03/28/20 0248 03/29/20 0359  NA 141 139 140 141  K 3.8 3.9 3.2* 3.4*  CL 104 103 104 105  CO2 27  --  26 26  GLUCOSE 119* 116* 117* 115*  BUN 15 14 9 8   CREATININE 0.67 0.70 0.76 0.83  CALCIUM 9.8  --  9.5 9.2  MG  --   --  1.9 1.9   Liver Function Tests: Recent Labs  Lab 03/26/20 1142  AST 17  ALT 11  ALKPHOS 73  BILITOT 0.7  PROT 8.1  ALBUMIN 4.5   No results for input(s): LIPASE, AMYLASE in the last 168 hours. No results for input(s): AMMONIA in the last 168 hours. CBC: Recent Labs  Lab 03/26/20 1142 03/26/20 1145  WBC 5.9  --   NEUTROABS 5.0  --   HGB 13.5 13.9  HCT 40.2 41.0  MCV 90.5  --   PLT 300  --    Cardiac Enzymes: No results for input(s): CKTOTAL, CKMB, CKMBINDEX, TROPONINI in the last 168 hours. BNP: Invalid input(s): POCBNP CBG: Recent Labs  Lab  03/26/20 1139 03/26/20 1728  GLUCAP 116* 104*   D-Dimer No results for input(s): DDIMER in the last 72 hours. Hgb A1c Recent Labs    03/28/20 0839  HGBA1C 5.6   Lipid Profile Recent Labs    03/28/20 0839  CHOL 431*  HDL 58  LDLCALC 358*  TRIG 75  CHOLHDL 7.4   Thyroid function studies No results for input(s): TSH, T4TOTAL, T3FREE, THYROIDAB in the last 72 hours.  Invalid input(s): FREET3 Anemia work up No results for input(s): VITAMINB12, FOLATE, FERRITIN, TIBC, IRON, RETICCTPCT in the last 72 hours. Urinalysis    Component Value Date/Time   COLORURINE YELLOW 10/24/2019 1234   APPEARANCEUR CLEAR 10/24/2019 1234   LABSPEC 1.020 10/24/2019 1234   LABSPEC 1.005 04/14/2016 1230   PHURINE 5.5 10/24/2019 1234   GLUCOSEU NEGATIVE 10/24/2019 1234   GLUCOSEU Negative 04/14/2016 1230   HGBUR TRACE (A) 10/24/2019 1234  BILIRUBINUR NEGATIVE 10/24/2019 1234   BILIRUBINUR Negative 04/14/2016 [place] 10/24/2019 1234   PROTEINUR NEGATIVE 10/24/2019 1234   UROBILINOGEN 0.2 04/14/2016 1230   NITRITE NEGATIVE 10/24/2019 1234   LEUKOCYTESUR TRACE (A) 10/24/2019 1234   LEUKOCYTESUR Trace 04/14/2016 1230   Sepsis Labs Invalid input(s): PROCALCITONIN,  WBC,  LACTICIDVEN Microbiology Recent Results (from the past 240 hour(s))  Respiratory Panel by RT PCR (Flu A&B, Covid) - Nasopharyngeal Swab     Status: None   Collection Time: 03/26/20  3:10 PM   Specimen: Nasopharyngeal Swab  Result Value Ref Range Status   SARS Coronavirus 2 by RT PCR NEGATIVE NEGATIVE Final    Comment: (NOTE) SARS-CoV-2 target nucleic acids are NOT DETECTED.  The SARS-CoV-2 RNA is generally detectable in upper respiratoy specimens during the acute phase of infection. The lowest concentration of SARS-CoV-2 viral copies this assay can detect is 131 copies/mL. A negative result does not preclude SARS-Cov-2 infection and should not be used as the sole basis for treatment or other patient  management decisions. A negative result may occur with  improper specimen collection/handling, submission of specimen other than nasopharyngeal swab, presence of viral mutation(s) within the areas targeted by this assay, and inadequate number of viral copies (<131 copies/mL). A negative result must be combined with clinical observations, patient history, and epidemiological information. The expected result is Negative.  Fact Sheet for Patients:  PinkCheek.be  Fact Sheet for Healthcare Providers:  GravelBags.it  This test is no t yet approved or cleared by the [place] FDA and  has been authorized for detection and/or diagnosis of SARS-CoV-2 by FDA under an Emergency Use Authorization (EUA). This EUA will remain  in effect (meaning this test can be used) for the duration of the COVID-19 declaration under Section 564(b)(1) of the Act, 21 U.S.C. section 360bbb-3(b)(1), unless the authorization is terminated or revoked sooner.     Influenza A by PCR NEGATIVE NEGATIVE Final   Influenza B by PCR NEGATIVE NEGATIVE Final    Comment: (NOTE) The Xpert Xpress SARS-CoV-2/FLU/RSV assay is intended as an aid in  the diagnosis of influenza from Nasopharyngeal swab specimens and  should not be used as a sole basis for treatment. Nasal washings and  aspirates are unacceptable for Xpert Xpress SARS-CoV-2/FLU/RSV  testing.  Fact Sheet for Patients: PinkCheek.be  Fact Sheet for Healthcare Providers: GravelBags.it  This test is not yet approved or cleared by the [place] FDA and  has been authorized for detection and/or diagnosis of SARS-CoV-2 by  FDA under an Emergency Use Authorization (EUA). This EUA will remain  in effect (meaning this test can be used) for the duration of the  Covid-19 declaration under Section 564(b)(1) of the Act, 21  U.S.C. section 360bbb-3(b)(1),  unless the authorization is  terminated or revoked. Performed at [facility], [place] [address], [place],  [address]      Time coordinating discharge:  I have spent 35 minutes face to face with the patient and on the ward discussing the patients care, assessment, plan and disposition with other care givers. >50% of the time was devoted counseling the patient about the risks and benefits of treatment/Discharge disposition and coordinating care.   SIGNED:   Damita Lack, MD  [facility] 03/29/2020, 8:13 AM   If 7PM-7AM, please contact night-coverage

## 2020-03-29 NOTE — TOC Transition Note (Signed)
Transition of Care [facility]) - CM/SW Discharge Note   Patient Details  Name: TAYTEM GHATTAS MRN: [id number] Date of Birth: March 25, 1941  [facility] CM/SW Contact:  Claudie Leach, RN 03/29/2020, 1:30 PM   Clinical Narrative:    Patient to d/c home with home health.  Referral accepted by [facility] with [place].  Care will start with SLP Monday.    Final next level of care: Home w Home Health Services Barriers to Discharge: Continued Medical Work up   Patient Goals and CMS Choice   CMS Medicare.gov Compare Post Acute Care list provided to:: Patient Represenative (must comment) Choice offered to / list presented to : Adult Children   Discharge Plan and Services   Discharge Planning Services: CM Consult Post Acute Care Choice: Home Health                    HH Arranged: PT, OT, Speech Therapy [place] Agency: [place] ([place]) Date [place]: 03/29/20 Time [place]: 0930 Representative spoke with at [place]: Corene Cornea

## 2020-03-31 ENCOUNTER — Telehealth: Payer: Self-pay | Admitting: *Deleted

## 2020-03-31 NOTE — Telephone Encounter (Signed)
Patient called and left a message to cancel her appt on 11/8 with Dr Berline Lopes. Called the patient to notify her that we received the message and canceled the appt. Asked if she like to reschedule the appt and patient stated "no we will call back." Patient disconnected the call

## 2020-04-07 ENCOUNTER — Inpatient Hospital Stay: Payer: Medicare Other | Admitting: Gynecologic Oncology

## 2020-04-22 ENCOUNTER — Encounter: Payer: Self-pay | Admitting: Gynecologic Oncology

## 2020-04-22 ENCOUNTER — Inpatient Hospital Stay: Payer: Medicare Other | Attending: Hematology & Oncology | Admitting: Gynecologic Oncology

## 2020-04-22 ENCOUNTER — Other Ambulatory Visit: Payer: Self-pay

## 2020-04-22 ENCOUNTER — Encounter: Payer: Self-pay | Admitting: Hematology & Oncology

## 2020-04-22 VITALS — BP 158/71 | HR 79 | Temp 98.0°F | Resp 18 | Wt 140.0 lb

## 2020-04-22 DIAGNOSIS — E039 Hypothyroidism, unspecified: Secondary | ICD-10-CM | POA: Insufficient documentation

## 2020-04-22 DIAGNOSIS — I2510 Atherosclerotic heart disease of native coronary artery without angina pectoris: Secondary | ICD-10-CM | POA: Diagnosis not present

## 2020-04-22 DIAGNOSIS — Z9221 Personal history of antineoplastic chemotherapy: Secondary | ICD-10-CM | POA: Diagnosis not present

## 2020-04-22 DIAGNOSIS — Z79899 Other long term (current) drug therapy: Secondary | ICD-10-CM | POA: Insufficient documentation

## 2020-04-22 DIAGNOSIS — I10 Essential (primary) hypertension: Secondary | ICD-10-CM | POA: Diagnosis not present

## 2020-04-22 DIAGNOSIS — Z8673 Personal history of transient ischemic attack (TIA), and cerebral infarction without residual deficits: Secondary | ICD-10-CM | POA: Insufficient documentation

## 2020-04-22 DIAGNOSIS — E785 Hyperlipidemia, unspecified: Secondary | ICD-10-CM | POA: Insufficient documentation

## 2020-04-22 DIAGNOSIS — Z90710 Acquired absence of both cervix and uterus: Secondary | ICD-10-CM | POA: Diagnosis not present

## 2020-04-22 DIAGNOSIS — Z923 Personal history of irradiation: Secondary | ICD-10-CM

## 2020-04-22 DIAGNOSIS — N9089 Other specified noninflammatory disorders of vulva and perineum: Secondary | ICD-10-CM | POA: Insufficient documentation

## 2020-04-22 DIAGNOSIS — Z90722 Acquired absence of ovaries, bilateral: Secondary | ICD-10-CM | POA: Diagnosis not present

## 2020-04-22 DIAGNOSIS — M1990 Unspecified osteoarthritis, unspecified site: Secondary | ICD-10-CM | POA: Diagnosis not present

## 2020-04-22 DIAGNOSIS — Z7901 Long term (current) use of anticoagulants: Secondary | ICD-10-CM | POA: Insufficient documentation

## 2020-04-22 DIAGNOSIS — C541 Malignant neoplasm of endometrium: Secondary | ICD-10-CM | POA: Diagnosis not present

## 2020-04-22 DIAGNOSIS — I4891 Unspecified atrial fibrillation: Secondary | ICD-10-CM | POA: Diagnosis not present

## 2020-04-22 MED ORDER — LIDOCAINE 5 % EX OINT: 1.0000 "application " | TOPICAL_OINTMENT | CUTANEOUS | 0 refills | Status: DC | PRN

## 2020-04-22 MED ORDER — CLOBETASOL PROPIONATE 0.05 % EX OINT: 1.0000 "application " | TOPICAL_OINTMENT | CUTANEOUS | 0 refills | Status: DC

## 2020-04-22 NOTE — Patient Instructions (Signed)
I will see you back in a few weeks to do another exam. Depending on how things look after treatment with the steroid cream, I may repeat a biopsy. Please use the squirt bottle to rinse your bottom off after you urinate. You can use the lidocaine cream to help with burning and pain during the day.

## 2020-04-22 NOTE — Progress Notes (Signed)
Gynecologic Oncology Return Clinic Visit  04/22/20  Reason for Visit: surveillance visit in the setting of endometrial cancer  Treatment History: Oncology History Overview Note  The patient began noticing postmenopausal bleeding in June, 2017. She saw her primary care physician who determined there was no blood in the urine, and she was then referred to Dr Benjie Karvonen who, on 11/21/15 performed a TVUS which showed a uterus measuring 6x2.7x3.3cm with normal ovaries. There was a thickened endometrium of 6.62m.    Endometrial cancer ([facility]  11/21/2015 Initial Diagnosis   Endometrial cancer ([place] A pipelle endometrial biopsy was performed on 11/21/15 which showed FIGO grade 1-2 endometrial cancer.  The pap from the same date was normal.   12/30/2015 Surgery   Robotic assisted total hysterectomy, BSO, pelvic and PA SLN biopsy. 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  Postoperative Diagnosis: same.  Surgery: Total robotic hysterectomy bilateral salpingo-oophorectomy, left pelvic, right para-aortic and bifurcation SLN removal  Operative findings:  1) Colon adherent to posterior uterus, left sidewall and appendix 2) 2 right para-aortic nodes for SLN (high and PA node) 3) Aortic bifurcation nodes 4) Left obturator node  Diagnosis 1. Lymph node, sentinel, biopsy, right obturator - ONE BENIGN LYMPH NODE (0/1). 2. Lymph node, sentinel, biopsy, right peri-aortic - ONE BENIGN LYMPH NODE (0/1). 3. Lymph node, sentinel, biopsy, right lower peri-aortic - ONE BENIGN LYMPH NODE (0/1). 4. Lymph node, sentinel, biopsy, aortic bifurcation - ONE BENIGN LYMPH NODE (0/1). 5. Lymph node, sentinel, biopsy, left obturator - ONE BENIGN LYMPH NODE (0/1). 6. Uterus +/- tubes/ovaries, neoplastic - ENDOMETRIAL ADENOCARCINOMA, 1.7 CM WITH SUPERFICIAL MYOMETRIAL INVASION. - MARGINS NOT INVOLVED. - CERVIX, BILATERAL OVARIES AND BILATERAL FALLOPIAN TUBES FREE OF TUMOR. Microscopic  Comment 6. ONCOLOGY TABLE-UTERUS, CARCINOMA OR CARCINOSARCOMA Specimen: Uterus with bilateral fallopian tubes and ovaries and sentinel lymph node biopsies. Procedure: Hysterectomy with sentinel lymph nodes. Lymph node sampling performed: Yes. Specimen integrity: Intact. Maximum tumor size: 1.7 cm Histologic type: Endometrioid with squamous differentiation. Grade: 2 Myometrial invasion: 0.3 cm where myometrium is 1 cm in thickness Cervical stromal involvement: No. Extent of involvement of other organs: None, identified. Lymph - vascular invasion: Not identified.    05/13/2016 Imaging   05/03/16 CT guided drainage: IMPRESSION: Successful CT guided aspiration of approximately 15 cc of serous, slightly cloudy / milky fluid from the residual collection within the left hemipelvis. All aspirated samples were sent to the laboratory for cytologic and gram stain analysis.  Cytology was negative   07/2017 Progression   Shannon WHITEin February 2019 presented to her primary physician with complaints of rectal bleeding.  She was scheduled for a colonoscopy and had taken the bowel prep when she presented with obstruction of her distal sigmoid colon.   08/18/2017 Imaging   maging 08/18/2017 The left ureter is dilated to the left hemipelvis. It becomes narrowed in the inflammatory pelvic mass. This is described below. Bladder is within normal limits.   Stomach/Bowel: Large hiatal hernia is unchanged. Stable prominent gastric folds within the hiatal hernia.   The colon is diffusely distended. Distal small bowel is distended. A transition point between dilated large bowel and decompressed large bowel occurs in the sigmoid colon. There is a heterogeneous ill-defined mass involving the sigmoid colon and left side of the pelvis which includes the colon wall, adjacent fat, and both fluid and gas elements. Findings are suspicious for a focal perforation with abscess formation. Underlying malignancy is a  strong consideration given the appearance and colon obstruction.  The mass also involves the left ureter causing an element of left ureteral dilatation worrisome for an element of left ureteral obstruction. The gas and fluid collection is very small measuring 1.8 x 1.2 cm on  image 109 of series 4. Overall mass size including the involved colon is 5.7 x 5.3 cm.    08/21/2017 Surgery   Shannon Scott underwent a diverting laparoscopic transverse loop colostomy and subsequent placement of a left ureteral stent.   08/2017 -  Radiation Therapy   She then received pelvic external beam radiation therapy    10/13/2017 - 11/17/2017 Chemotherapy   The patient had dexamethasone (DECADRON) 4 MG tablet, 8 mg, Oral, Daily, 1 of 1 cycle, Start date: --, End date: -- palonosetron (ALOXI) injection 0.25 mg, 0.25 mg, Intravenous,  Once, 6 of 6 cycles Administration: 0.25 mg (10/13/2017), 0.25 mg (10/20/2017), 0.25 mg (11/03/2017), 0.25 mg (11/10/2017), 0.25 mg (11/17/2017) CARBOplatin (PARAPLATIN) 150 mg in sodium chloride 0.9 % 100 mL chemo infusion, 150 mg (100 % of original dose 148 mg), Intravenous,  Once, 6 of 6 cycles Dose modification:   (original dose 148 mg, Cycle 1) Administration: 150 mg (10/13/2017), 150 mg (10/20/2017), 150 mg (11/03/2017), 150 mg (11/10/2017), 150 mg (11/17/2017) PACLitaxel (TAXOL) 132 mg in sodium chloride 0.9 % 250 mL chemo infusion (</= 61m/m2), 80 mg/m2 = 132 mg, Intravenous,  Once, 6 of 6 cycles Dose modification: 45 mg/m2 (original dose 80 mg/m2, Cycle 2, Reason: Other (see comments), Comment: Decreasing dose to 45 mg/m2 while undergoing XRT) Administration: 132 mg (10/13/2017), 72 mg (10/20/2017), 72 mg (11/03/2017), 72 mg (11/10/2017), 72 mg (11/17/2017)  for chemotherapy treatment.    01/18/2018 Imaging   notable for the presence of an IVC filter and 11 mm left internal iliac node that was previously 1.6 cm and a soft tissue mass in the left pelvic sidewall and now measuring 3.9 x 2.5  cm.   03/03/2018 Imaging   PET  demonstrates a hypermetabolic left pelvic sidewall mass, greater than 3 weeks after completion of radiotherapy.   03/16/2018 -  Chemotherapy   The patient had pembrolizumab (KEYTRUDA) 200 mg in sodium chloride 0.9 % 50 mL chemo infusion, 200 mg, Intravenous, Once, 19 of 22 cycles Dose modification: 400 mg (original dose 200 mg, Cycle 11, Reason: Other (see comments), Comment: change to q 6 week) Administration: 200 mg (03/16/2018), 200 mg (04/06/2018), 200 mg (05/04/2018), 200 mg (05/25/2018), 200 mg (10/10/2018), 200 mg (06/15/2018), 200 mg (07/27/2018), 200 mg (10/31/2018), 200 mg (11/21/2018), 200 mg (01/03/2019), 400 mg (01/23/2019), 400 mg (03/07/2019), 400 mg (04/17/2019), 400 mg (06/08/2019), 400 mg (08/02/2019), 400 mg (09/12/2019), 400 mg (10/24/2019), 400 mg (12/04/2019), 400 mg (02/05/2020)  for chemotherapy treatment.    04/2018 Miscellaneous    CA 125  Ref. Range 05/04/2018 12:08 05/25/2018 11:21 06/15/2018 13:30 10/10/2018 12:20 01/23/2019 11:35  Cancer Antigen (CA) 125 Latest Ref Range: 0.0 - 38.1 U/mL 11.2 10.9 10.2 10.9 9.2     10/05/2018 Imaging   PET  3. Continued positive response to therapy within the left pelvic sidewall mass, which demonstrates residual low level hypermetabolism that has mildly decreased. 4. Resolved mediastinal nodal hypermetabolism. Nearly resolved bilateral hilar nodal hypermetabolism. These findings could represent resolving reactive nodes versus response to therapy within metastatic nodes. 5. No metabolic evidence of new sites of metastatic disease.   09/27/2019 Imaging   CT C/A/P: IMPRESSION: 1. Since the PET of 10/05/2018, similar to slight decrease in left pelvic sidewall soft tissue fullness, without well-defined mass.  2. No findings of  new or progressive disease. 3. Chronic or recurrent left renal pelvic and proximal ureteric wall thickening/mucosal hyperenhancement. Nonspecific, especially in the setting of a prior stent.  Cannot exclude ascending infection. Similarly, improved bladder wall and surrounding edema which could simply be treatment related. Correlate with urinalysis to exclude cystitis. 4. Small hiatal hernia. 5. Transverse colostomy with similar small bowel containing parastomal hernia. 6.  Aortic Atherosclerosis (ICD10-I70.0).   03/17/2020 Imaging   CT C/A/P: 1. No evidence of cervical cancer local recurrence or metastatic disease. 2. LEFT midline colostomy with peristomal hernia. Long segment small bowel enters the hernia sac without obstruction. 3. Chronic fractures sacral insufficiency fractures     Interval History: Patient presents today after recent admission for stroke.  She initially had difficulty with her speech.  She denies any other noticeable symptoms related to her stroke.  Her speech has improved significantly although she still has some noted deficits.  She was started on Eliquis at discharge on 10/30 due to concerns for A. Fib.  Since her last visit with me, she was also seen by her previous GYN oncologist, Dr. Skeet Latch, at [facility].  She had what appeared to be vulvar dermatitis.  Biopsy at her visit in [place] was negative for dysplasia.  Showed squamous epithelium with interface and epidermal mild acute inflammatory infiltrate.  Superficial and deep dermis also noted with moderate lymphocytic inflammatory infiltrate.    Today, she reports that her symptoms resolved after 2 weeks of using triamcinolone, which I have prescribed in August.  She reports that since that time, she began having symptoms again that include burning on her vulva when she urinates, discomfort when sitting.  She denies any vaginal bleeding or discharge.  Patient last saw Dr. Marin Olp and Laverna Peace on 9/7.  She continues on Pembro, now every 8 weeks, to help decrease side effects.  She overall is doing well without significant symptoms.  Her recent CT scan a month ago showed no evidence of  metastatic disease.  She continues to endorse intermittent left lower extremity edema.  She notes significant improvement during her recent hospitalization when she was mostly lying in bed.  She has intermittent abdominal pain, hearing loss, joint pain, and vulvar symptoms.  She endorses a good appetite without any nausea or emesis.  She denies any change to her bowel function or issues with her ostomy.  Past Medical/Surgical History: Past Medical History:  Diagnosis Date  . A-fib ([place]) 03/26/2020  . Acute CVA (cerebrovascular accident) ([place]) 03/26/2020  . Arthritis   . Asthma    allergy induced  . Bilateral carotid artery disease ([facility])    L carotid bruit  . Bursitis    left hip  . CAD (coronary artery disease)   . Cancer ([place])   . Dyslipidemia    intolerant to statins, welchol, niacin, zetia  . Goals of care, counseling/discussion 10/11/2017  . History of blood transfusion   . History of nuclear stress test 04/24/2012   lexiscan; normal study  . Hypertension   . Hypothyroidism   . 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 ([place]) 10/11/2017  . Postoperative nausea and vomiting 01/02/2016    Past Surgical History:  Procedure Laterality Date  . ABDOMINAL HYSTERECTOMY    . Carotid Doppler  02/2012   40-59% right int carotid artery stenosis; 60-79% L int carotid stenosis; L carotid bruit  . CORONARY ARTERY BYPASS GRAFT  03/12/2004   LIMA to LAD, SVG to  circumflex, SVG to PDA  . CYSTOSCOPY W/ URETERAL STENT PLACEMENT Left 12/06/2017   Procedure: CYSTOSCOPY WITH LEFT URETERAL STENT EXCHANGE;  Surgeon: Irine Seal, MD;  Location: [facility];  Service: Urology;  Laterality: Left;  . CYSTOSCOPY WITH STENT PLACEMENT Bilateral 08/21/2017   Procedure: CYSTOSCOPY WITH STENT PLACEMENT;  Surgeon: Ceasar Mons, MD;  Location: [facility];  Service: Urology;  Laterality: Bilateral;  . IR FLUORO GUIDE PORT INSERTION RIGHT  10/11/2017  . IR GENERIC HISTORICAL   04/29/2016   IR RADIOLOGIST EVAL & MGMT 04/29/2016 Sandi Mariscal, MD [facility] INTERV RAD  . IR GENERIC HISTORICAL  05/12/2016   IR RADIOLOGIST EVAL & MGMT 05/12/2016 Sandi Mariscal, MD [facility] INTERV RAD  . IR IVC FILTER PLMT / S&I /IMG GUID/MOD SED  10/11/2017  . IR US GUIDE VASC ACCESS RIGHT  10/11/2017  . ROBOTIC ASSISTED TOTAL HYSTERECTOMY WITH BILATERAL SALPINGO OOPHERECTOMY Bilateral 12/30/2015   Procedure: XI ROBOTIC ASSISTED TOTAL HYSTERECTOMY WITH BILATERAL SALPINGO OOPHORECTOMY WITH SENTAL LYMPH NODE BIOPSY;  Surgeon: Nancy Marus, MD;  Location: [facility];  Service: Gynecology;  Laterality: Bilateral;  . TONSILLECTOMY    . TRANSTHORACIC ECHOCARDIOGRAM  04/2007   EF>55%; mild MR; mild-mod TR; mild pulm HTN; mild calcification of aortiv valve leaflets with mild valvular aortic stenosis  . TUBAL LIGATION      Family History  Problem Relation Age of Onset  . Hypertension Mother   . Heart disease Mother        Died in her 70s  . Stroke Father   . Kidney disease Brother   . Heart disease Brother        also HTN, hyperlipidemia  . Heart attack Brother   . Stroke Sister        x2  . Hypertension Sister     Social History   Socioeconomic History  . Marital status: Widowed    Spouse name: Not on file  . Number of children: 2  . Years of education: Not on file  . Highest education level: Not on file  Occupational History    Employer: RETIRED  Tobacco Use  . Smoking status: Never Smoker  . Smokeless tobacco: Never Used  Vaping Use  . Vaping Use: Never used  Substance and Sexual Activity  . Alcohol use: No  . Drug use: No  . Sexual activity: Not Currently  Other Topics Concern  . Not on file  Social History Narrative  . Not on file   Social Determinants of Health   Financial Resource Strain:   . Difficulty of Paying Living Expenses: Not on file  Food Insecurity:   . Worried About Charity fundraiser in the Last Year: Not on file  . Ran Out of Food in the Last Year: Not on file   Transportation Needs:   . Lack of Transportation (Medical): Not on file  . Lack of Transportation (Non-Medical): Not on file  Physical Activity:   . Days of Exercise per Week: Not on file  . Minutes of Exercise per Session: Not on file  Stress:   . Feeling of Stress : Not on file  Social Connections:   . Frequency of Communication with Friends and Family: Not on file  . Frequency of Social Gatherings with Friends and Family: Not on file  . Attends Religious Services: Not on file  . Active Member of Clubs or Organizations: Not on file  . Attends Archivist Meetings: Not on file  . Marital Status: Not on file  Current Medications:  Current Outpatient Medications:  .  apixaban (ELIQUIS) 5 MG TABS tablet, Take 1 tablet (5 mg total) by mouth 2 (two) times daily., Disp: 60 tablet, Rfl: 0 .  diltiazem (TIAZAC) 300 MG 24 hr capsule, TAKE 1 CAPSULE(300 MG) BY MOUTH DAILY (Patient taking differently: Take 300 mg by mouth daily. ), Disp: 30 capsule, Rfl: 1 .  levothyroxine (SYNTHROID, LEVOTHROID) 50 MCG tablet, Take 1 tablet (50 mcg total) by mouth daily before breakfast., Disp: 30 tablet, Rfl: 1 .  polyethylene glycol (MIRALAX / GLYCOLAX) 17 g packet, Take 17 g by mouth daily as needed for severe constipation., Disp: 14 each, Rfl: 0 .  traMADol (ULTRAM) 50 MG tablet, TAKE 2 TABLETS(100 MG) BY MOUTH TWICE DAILY (Patient taking differently: Take 100 mg by mouth 2 (two) times daily. ), Disp: 120 tablet, Rfl: 0 .  [START ON 04/23/2020] clobetasol ointment (TEMOVATE) 6.54 %, Apply 1 application topically 3 (three) times a week., Disp: 30 g, Rfl: 0 .  ibuprofen (ADVIL,MOTRIN) 200 MG tablet, Take 200 mg by mouth daily as needed (back pain). (Patient not taking: Reported on 04/22/2020), Disp: , Rfl:  .  lidocaine (XYLOCAINE) 5 % ointment, Apply 1 application topically as needed. Up to TID for treatment of burning symptoms of the vulva, Disp: 35.44 g, Rfl: 0 No current facility-administered  medications for this visit.  Facility-Administered Medications Ordered in Other Visits:  .  sodium chloride flush (NS) 0.9 % injection 10 mL, 10 mL, Intracatheter, PRN, Volanda Napoleon, MD, 10 mL at 02/05/20 1440  Review of Systems: Pertinent positives as per HPI Denies appetite changes, fevers, chills, fatigue, unexplained weight changes. Denies neck lumps or masses, mouth sores, ringing in ears or voice changes. Denies cough or wheezing.  Denies shortness of breath. Denies chest pain or palpitations.  Denies abdominal distention, blood in stools, constipation, diarrhea, nausea, vomiting, or early satiety. Denies pain with intercourse, dysuria, hematuria or incontinence. Denies hot flashes, pelvic pain, vaginal bleeding or vaginal discharge.   Denies back pain or muscle pain/cramps. Denies rash, or wounds. Denies dizziness, headaches, numbness or seizures. Denies swollen lymph nodes or glands, denies easy bruising or bleeding. Denies anxiety, depression, confusion, or decreased concentration.  Physical Exam: BP (!) 158/71 (BP Location: Right Arm, Patient Position: Sitting)   Pulse 79   Temp 98 F (36.7 C) (Tympanic)   Resp 18   Wt 140 lb (63.5 kg)   SpO2 98%   BMI 26.45 kg/m  General: Alert, oriented, no acute distress. HEENT: Normocephalic, atraumatic, sclera anicteric. Chest: Unlabored breathing on room air. Abdomen: soft, nontender.  Normoactive bowel sounds.  No masses or hepatosplenomegaly appreciated.  Ostomy pink and well perfused, parastomal hernia noted. Extremities: Grossly normal range of motion.  Warm, well perfused.  1-2+ edema of left lower extremity.  No calf tenderness to palpation. Skin: No rashes or lesions noted. Lymphatics: No cervical, supraclavicular, or inguinal adenopathy. GU: External genitalia notable for significant erythema of bilateral labia.  There continues to be notable findings consistent with lichen sclerosis including loss of the inferior  aspect of the labia minora bilaterally and also loss of architecture of the vulvar tissue.  There is an area of leukopoor aplasia that is approximately 1 x 2 cm in the left mid inner labia and significantly irritated tissue of the inner labia bilaterally.  Patient is quite tender to the touch.  Laboratory & Radiologic Studies: Most recent CT scan noted above in treatment history  [place] tumor testing: TMB 10.5,  MSI-stable  Assessment & Plan: Shannon Scott is a 79 y.o. woman with recurrent endometrial cancer, currently managed on Pembro, who presents for 106-monthfollow-up visit who has recurrence of her vulvar symptoms after what she describes as sort interval of symptom improvement after topical steroids.  Patient has significant erythema and tenderness of the vulva.  There is some changes on the left vulva that are concerning for dysplasia although she had a biopsy negative for dysplasia or malignancy last month.  Given her tenderness as well as diffuse erythema and findings that can be seen with lichen sclerosis, I am going to treat with clobetasol for 3 weeks and then see her back.  Depending on findings at that point as well as her ability to tolerate an exam, discussed possible biopsy.  I am hopeful again that if related to lichen sclerosis or an induced dermatitis from medication, that clobetasol will improve both the appearance of her bowel and her symptoms.  In terms of her edema, [facility]  She has been using some compression socks with improvement in her edema but notes pain and tightness at the top where her sock is just below the knee.  I suggested that she try either stockings or socks that go up to the mid thigh.  The patient has been on [place] for just over 2 years. With the exception of some dermatologic side effects, she is tolerating treatment very well and had good response on imaging, with her most recent imaging showing no  evidence of disease.  [facility]  I think it is reasonable to continue on Pembro if the benefits outweigh the side effects.    Neurology felt that this was likely related to atrial fibrillation.  She has previously had transient atrial fibrillation in the past.  For this, she was started on Eliquis which she has continued since her discharge.  There are case reports of Pembro-induced changes ultimately leading to stroke.  Given the length of time that she has been on this treatment, as well as her other risk factors for stroke, I [facility]   Patient is scheduled to see Dr. EMarin Olpnext in December.  I will see her back in several weeks to reevaluate her vulva.  35 minutes of total time was spent for this patient encounter, including preparation, face-to-face counseling with the patient and coordination of care, and documentation of the encounter.  KJeral Pinch MD  Division of Gynecologic Oncology  Department of Obstetrics and Gynecology  [facility]

## 2020-04-23 ENCOUNTER — Telehealth: Payer: Self-pay

## 2020-04-23 ENCOUNTER — Telehealth: Payer: Self-pay | Admitting: *Deleted

## 2020-04-23 ENCOUNTER — Encounter: Payer: Self-pay | Admitting: Gynecologic Oncology

## 2020-04-23 NOTE — Telephone Encounter (Signed)
Called the patient and moved her appt from 12/13 to 1/3

## 2020-04-23 NOTE — Telephone Encounter (Signed)
Pt had sent a mychart message to the nurse to r/s her 12/7/appts, s/w pt and her appts have been moved to 05/20/20/// AOM

## 2020-04-23 NOTE — Telephone Encounter (Signed)
Shannon Scott called stating that she notice a red bumps on her left inner thigh when she was changing her colostomy bag. The bumps are not itchy. She did not know if it was a reaction to the Eliquis.   She has not begun using the clobetasol ointment or the lidocaine 5% ointment that Dr. Berline Lopes.  Suggested that she contact her PCP  Since medication was prescribed while in the hospital in October 27-30 and she was to follow up with her PCP 1-2 weeks from discharged. Shannon Scott verbalized understanding.

## 2020-04-29 ENCOUNTER — Inpatient Hospital Stay: Payer: Medicare Other | Admitting: Adult Health

## 2020-05-06 ENCOUNTER — Other Ambulatory Visit: Payer: Medicare Other

## 2020-05-06 ENCOUNTER — Ambulatory Visit: Payer: Medicare Other

## 2020-05-06 ENCOUNTER — Ambulatory Visit: Payer: Medicare Other | Admitting: Hematology & Oncology

## 2020-05-09 ENCOUNTER — Other Ambulatory Visit: Payer: Self-pay | Admitting: Family

## 2020-05-12 ENCOUNTER — Ambulatory Visit: Payer: Medicare Other | Admitting: Gynecologic Oncology

## 2020-05-14 ENCOUNTER — Ambulatory Visit: Payer: Medicare Other | Admitting: Gynecologic Oncology

## 2020-05-16 ENCOUNTER — Other Ambulatory Visit: Payer: Self-pay | Admitting: Hematology & Oncology

## 2020-05-16 ENCOUNTER — Other Ambulatory Visit: Payer: Self-pay | Admitting: Physical Medicine and Rehabilitation

## 2020-05-16 DIAGNOSIS — M792 Neuralgia and neuritis, unspecified: Secondary | ICD-10-CM

## 2020-05-16 DIAGNOSIS — M533 Sacrococcygeal disorders, not elsewhere classified: Secondary | ICD-10-CM

## 2020-05-16 DIAGNOSIS — M545 Low back pain, unspecified: Secondary | ICD-10-CM

## 2020-05-16 DIAGNOSIS — G8929 Other chronic pain: Secondary | ICD-10-CM

## 2020-05-16 DIAGNOSIS — M549 Dorsalgia, unspecified: Secondary | ICD-10-CM

## 2020-05-16 DIAGNOSIS — C541 Malignant neoplasm of endometrium: Secondary | ICD-10-CM

## 2020-05-16 MED ORDER — TRAMADOL HCL 50 MG PO TABS: ORAL_TABLET | ORAL | 0 refills | Status: DC

## 2020-05-20 ENCOUNTER — Other Ambulatory Visit: Payer: Self-pay | Admitting: *Deleted

## 2020-05-20 ENCOUNTER — Inpatient Hospital Stay (HOSPITAL_BASED_OUTPATIENT_CLINIC_OR_DEPARTMENT_OTHER): Payer: Medicare Other | Admitting: Hematology & Oncology

## 2020-05-20 ENCOUNTER — Encounter: Payer: Self-pay | Admitting: Hematology & Oncology

## 2020-05-20 ENCOUNTER — Telehealth: Payer: Self-pay | Admitting: Hematology & Oncology

## 2020-05-20 ENCOUNTER — Other Ambulatory Visit: Payer: Self-pay

## 2020-05-20 ENCOUNTER — Inpatient Hospital Stay: Payer: Medicare Other

## 2020-05-20 ENCOUNTER — Inpatient Hospital Stay: Payer: Medicare Other | Attending: Hematology & Oncology

## 2020-05-20 VITALS — BP 186/74 | HR 71 | Temp 98.7°F

## 2020-05-20 DIAGNOSIS — Z95828 Presence of other vascular implants and grafts: Secondary | ICD-10-CM

## 2020-05-20 DIAGNOSIS — Z7901 Long term (current) use of anticoagulants: Secondary | ICD-10-CM | POA: Insufficient documentation

## 2020-05-20 DIAGNOSIS — Z5112 Encounter for antineoplastic immunotherapy: Secondary | ICD-10-CM | POA: Diagnosis not present

## 2020-05-20 DIAGNOSIS — Z923 Personal history of irradiation: Secondary | ICD-10-CM | POA: Diagnosis not present

## 2020-05-20 DIAGNOSIS — C799 Secondary malignant neoplasm of unspecified site: Secondary | ICD-10-CM

## 2020-05-20 DIAGNOSIS — C541 Malignant neoplasm of endometrium: Secondary | ICD-10-CM | POA: Diagnosis not present

## 2020-05-20 DIAGNOSIS — Z8673 Personal history of transient ischemic attack (TIA), and cerebral infarction without residual deficits: Secondary | ICD-10-CM | POA: Diagnosis not present

## 2020-05-20 DIAGNOSIS — E032 Hypothyroidism due to medicaments and other exogenous substances: Secondary | ICD-10-CM

## 2020-05-20 DIAGNOSIS — Z79899 Other long term (current) drug therapy: Secondary | ICD-10-CM | POA: Insufficient documentation

## 2020-05-20 DIAGNOSIS — I4891 Unspecified atrial fibrillation: Secondary | ICD-10-CM | POA: Diagnosis not present

## 2020-05-20 DIAGNOSIS — D508 Other iron deficiency anemias: Secondary | ICD-10-CM

## 2020-05-20 DIAGNOSIS — C539 Malignant neoplasm of cervix uteri, unspecified: Secondary | ICD-10-CM | POA: Diagnosis not present

## 2020-05-20 LAB — CMP (CANCER CENTER ONLY)
ALT: 7 U/L (ref 0–44)
AST: 13 U/L — ABNORMAL LOW (ref 15–41)
Albumin: 4.2 g/dL (ref 3.5–5.0)
Alkaline Phosphatase: 52 U/L (ref 38–126)
Anion gap: 7 (ref 5–15)
BUN: 13 mg/dL (ref 8–23)
CO2: 29 mmol/L (ref 22–32)
Calcium: 9.9 mg/dL (ref 8.9–10.3)
Chloride: 102 mmol/L (ref 98–111)
Creatinine: 0.77 mg/dL (ref 0.44–1.00)
GFR, Estimated: >60 mL/min (ref >60)
Glucose, Bld: 109 mg/dL — ABNORMAL HIGH (ref 70–99)
Potassium: 3.7 mmol/L (ref 3.5–5.1)
Sodium: 138 mmol/L (ref 135–145)
Total Bilirubin: 0.3 mg/dL (ref 0.3–1.2)
Total Protein: 7.3 g/dL (ref 6.5–8.1)

## 2020-05-20 LAB — CBC WITH DIFFERENTIAL (CANCER CENTER ONLY)
Abs Immature Granulocytes: 0.02 10*3/uL (ref 0.00–0.07)
Basophils Absolute: 0 10*3/uL (ref 0.0–0.1)
Basophils Relative: 1 %
Eosinophils Absolute: 0 10*3/uL (ref 0.0–0.5)
Eosinophils Relative: 1 %
HCT: 35.9 % — ABNORMAL LOW (ref 36.0–46.0)
Hemoglobin: 11.9 g/dL — ABNORMAL LOW (ref 12.0–15.0)
Immature Granulocytes: 0 %
Lymphocytes Relative: 15 %
Lymphs Abs: 0.7 10*3/uL (ref 0.7–4.0)
MCH: 31 pg (ref 26.0–34.0)
MCHC: 33.1 g/dL (ref 30.0–36.0)
MCV: 93.5 fL (ref 80.0–100.0)
Monocytes Absolute: 0.3 10*3/uL (ref 0.1–1.0)
Monocytes Relative: 7 %
Neutro Abs: 3.9 10*3/uL (ref 1.7–7.7)
Neutrophils Relative %: 76 %
Platelet Count: 260 10*3/uL (ref 150–400)
RBC: 3.84 MIL/uL — ABNORMAL LOW (ref 3.87–5.11)
RDW: 15.3 % (ref 11.5–15.5)
WBC Count: 5 10*3/uL (ref 4.0–10.5)
nRBC: 0 % (ref 0.0–0.2)

## 2020-05-20 LAB — IRON AND TIBC
Iron: 55 ug/dL (ref 41–142)
Saturation Ratios: 25 % (ref 21–57)
TIBC: 220 ug/dL — ABNORMAL LOW (ref 236–444)
UIBC: 165 ug/dL (ref 120–384)

## 2020-05-20 LAB — FERRITIN: Ferritin: 614 ng/mL — ABNORMAL HIGH (ref 11–307)

## 2020-05-20 LAB — LACTATE DEHYDROGENASE: LDH: 119 U/L (ref 98–192)

## 2020-05-20 LAB — TSH: TSH: 2.6 u[IU]/mL (ref 0.308–3.960)

## 2020-05-20 MED ORDER — VALACYCLOVIR HCL 500 MG PO TABS: 500.0000 mg | ORAL_TABLET | Freq: Every day | ORAL | 4 refills | Status: DC

## 2020-05-20 MED ORDER — LIDOCAINE-PRILOCAINE 2.5-2.5 % EX CREA: TOPICAL_CREAM | CUTANEOUS | 2 refills | Status: DC

## 2020-05-20 MED ORDER — HEPARIN SOD (PORK) LOCK FLUSH 100 UNIT/ML IV SOLN
500.0000 [IU] | Freq: Once | INTRAVENOUS | Status: AC
  Administered 2020-05-20: 500 [IU] via INTRAVENOUS
  Filled 2020-05-20: qty 5

## 2020-05-20 MED ORDER — SODIUM CHLORIDE 0.9% FLUSH
10.0000 mL | Freq: Once | INTRAVENOUS | Status: AC
  Administered 2020-05-20: 10 mL via INTRAVENOUS
  Filled 2020-05-20: qty 10

## 2020-05-20 NOTE — Patient Instructions (Signed)
Tunneled Central Venous Catheter Flushing Guide  It is important to flush your tunneled central venous catheter each time you use it, both before and after you use it. Flushing your catheter will help prevent it from clogging. What are the risks? Risks may include:  Infection.  Air getting into the catheter and bloodstream. Supplies needed:  A clean pair of gloves.  A disinfecting wipe. Use an alcohol wipe, chlorhexidine wipe, or iodine wipe as told by your health care provider.  A 10 mL syringe that has been prefilled with saline solution.  An empty 10 mL syringe, if a substance called heparin was injected into your catheter. How to flush your catheter When you flush your catheter, make sure you follow any specific instructions from your health care provider or the manufacturer. These are general guidelines. Flushing your catheter before use If there is heparin in your catheter: 1. Wash your hands with soap and water. 2. Put on gloves. 3. Scrub the injection cap for a minimum of 15 seconds with a disinfecting wipe. 4. Unclamp the catheter. 5. Attach the empty syringe to the injection cap. 6. Pull the syringe plunger back and withdraw 10 mL of blood. 7. Place the syringe into an appropriate waste container. 8. Scrub the injection cap for 15 seconds with a disinfecting wipe. 9. Attach the prefilled syringe to the injection cap. 10. Flush the catheter by pushing the plunger forward until all the liquid from the syringe is in the catheter. 11. Remove the syringe from the injection cap. 12. Clamp the catheter. If there is no heparin in your catheter: 1. Wash your hands with soap and water. 2. Put on gloves. 3. Scrub the injection cap for 15 seconds with a disinfecting wipe. 4. Unclamp the catheter. 5. Attach the prefilled syringe to the injection cap. 6. Flush the catheter by pushing the plunger forward until 5 mL of the liquid from the syringe is in the catheter. 7. Pull back on  the syringe until you see blood in the catheter. 8. If you have been asked to collect any blood, follow your health care provider's instructions. Otherwise, flush the catheter with the rest of the solution from the syringe. 9. Remove the syringe from the injection cap. 10. Clamp the catheter.  Flushing your catheter after use 1. Wash your hands with soap and water. 2. Put on gloves. 3. Scrub the injection cap for 15 seconds with a disinfecting wipe. 4. Unclamp the catheter. 5. Attach the prefilled syringe to the injection cap. 6. Flush the catheter by pushing the plunger forward until all of the liquid from the syringe is in the catheter. 7. Remove the syringe from the injection cap. 8. Clamp the catheter. Problems and solutions  If blood cannot be completely cleared from the injection cap, you may need to have the injection cap replaced.  If the catheter is difficult to flush, use the pulsing method. The pulsing method involves pushing only a few milliliters of solution into the catheter at a time and pausing between pushes.  If you do not see blood in the catheter when you pull back on the syringe, change your body position, such as by raising your arms above your head. Take a deep breath and cough. Then, pull back on the syringe. If you still do not see blood, flush the catheter with a small amount of solution. Then, change positions again and take a breath or cough. Pull back on the syringe again. If you still do not see  blood, finish flushing the catheter and contact your health care provider. Do not use your catheter until your health care provider says it is okay. General tips  Have someone help you flush your catheter, if possible.  Do not force fluid through your catheter.  Do not use a syringe that is larger or smaller than 10 mL. Using a smaller syringe can make the catheter burst.  Do not use your catheter without flushing it first if it has heparin in it. Contact a health  care provider if:  You cannot see any blood in the catheter when you flush it before using it.  Your catheter is difficult to flush. Get help right away if:  You cannot flush the catheter.  The catheter leaks when you flush it or when there is fluid in it.  There are cracks or breaks in the catheter. Summary  It is important to flush your tunneled central venous catheter each time you use it, both before and after you use it.  Scrub the injection cap for 15 seconds with a disinfecting wipe before and after you flush it.  When you flush your catheter, make sure you follow any specific instructions from your health care provider or the manufacturer.  Get help right away if you cannot flush the catheter. This information is not intended to replace advice given to you by your health care provider. Make sure you discuss any questions you have with your health care provider. Document Revised: 02/09/2019 Document Reviewed: 08/02/2018 Elsevier Patient Education  [employer].

## 2020-05-20 NOTE — Addendum Note (Signed)
Addended by: Amelia Jo I on: 05/20/2020 11:00 AM   Modules accepted: Orders

## 2020-05-20 NOTE — Telephone Encounter (Signed)
Appointments scheduled calendar printed & mailed per 12/21 los

## 2020-05-20 NOTE — Progress Notes (Signed)
Hematology and Oncology Follow Up Visit  Shannon Scott [id number] 1941-01-31 79 y.o. 05/20/2020   Principle Diagnosis:  Locally advanced/recurrent endometrial carcinoma undifferentiated -- TMB (HIGH) / MSI HIGH  Past Therapy: Radiation therapyfor 5 -5 1/2 weeks - s/p 20 fractions Taxol/carboplatinumq 7 days -- s/p cycle 6  Current Therapy: Pembrolizumab 400 mg q 12 weeks, s/p cycle 19 (originally startedon 01/23/2019 at 400 mg IV q 6 wk)  Zometa 4 mg IV q 3 months -- next dose on 05/2020 IV iron as indicated   Interim History:  Shannon Scott is here today with her daughter for follow-up and treatment.  She does not want treatment today.  She does not feel all that well.  She apparently had a CVA recently.  I am not sure exactly this was a CVA or TIA.  She was placed on Eliquis given the fact that she has a past history of atrial fibrillation.  Her blood pressure is quite high today.  She has had this because the pain that she is having.  She has arthritic pain in her back.  She sees orthopedic surgery and gets injections.  She has shingles.  This was in her left thigh.  She was on Valtrex for this.  I think she needs low-dose immunosuppressive Valtrex.  I think this would be reasonable.  She is just having a tough time right now.  I feel bad that she is having a tough time.  I know there is been issues with her family.  Her daughter is husband passed away from [place] in 2023/04/21.  She had her last CT scan back in 2023-04-21.  I think it be reasonable to get 1 when we see her back in January.  She has had a decent appetite.  She has had no nausea or vomiting.  She has had no cough.  She is getting around.  I think it is been somewhat of a challenge for her because of the back issues.  Overall, I had to say her performance status is by ECOG 2.   Medications:  Allergies as of 05/20/2020      Reactions   Statins Other (See Comments)   Myalgias and memory problems    Ciprofloxacin Itching   Splotchy redness with itching during IV infusion localized to arm.      Medication List       Accurate as of May 20, 2020  2:03 PM. If you have any questions, ask your nurse or doctor.        acetaminophen 500 MG tablet Commonly known as: TYLENOL 1 tablet as needed   apixaban 5 MG Tabs tablet Commonly known as: ELIQUIS Take 1 tablet (5 mg total) by mouth 2 (two) times daily. What changed: Another medication with the same name was removed. Continue taking this medication, and follow the directions you see here. Changed by: Volanda Napoleon, MD   clobetasol ointment 0.05 % Commonly known as: TEMOVATE Apply 1 application topically 3 (three) times a week.   diltiazem 300 MG 24 hr capsule Commonly known as: TIAZAC TAKE 1 CAPSULE(300 MG) BY MOUTH DAILY What changed: See the new instructions.   gabapentin 100 MG capsule Commonly known as: NEURONTIN 2 capsule   ibuprofen 200 MG tablet Commonly known as: ADVIL Take 200 mg by mouth daily as needed (back pain).   levothyroxine 50 MCG tablet Commonly known as: SYNTHROID Take 1 tablet (50 mcg total) by mouth daily before breakfast.   lidocaine 5 % ointment Commonly  known as: XYLOCAINE Apply 1 application topically as needed. Up to TID for treatment of burning symptoms of the vulva   lidocaine-prilocaine cream Commonly known as: EMLA Apply a dime size to port-a-cath 1-2 hours prior to access. Cover with Saran wrap. Started by: Amelia Jo, RN   ondansetron 8 MG disintegrating tablet Commonly known as: ZOFRAN-ODT 1 tablet on the tongue and allow to dissolve  as needed   polyethylene glycol 17 g packet Commonly known as: MIRALAX / GLYCOLAX Take 17 g by mouth daily as needed for severe constipation. What changed: Another medication with the same name was removed. Continue taking this medication, and follow the directions you see here. Changed by: Volanda Napoleon, MD   sennosides-docusate sodium  8.6-50 MG tablet Commonly known as: SENOKOT-S 1 tablet in the evening as needed   traMADol 50 MG tablet Commonly known as: ULTRAM Take two tablets (100 mg) by mouth twice daily.   valACYclovir 500 MG tablet Commonly known as: VALTREX Take 1 tablet (500 mg total) by mouth daily. What changed:   medication strength  how much to take  when to take this Changed by: Volanda Napoleon, MD       Allergies:  Allergies  Allergen Reactions  . Statins Other (See Comments)    Myalgias and memory problems  . Ciprofloxacin Itching    Splotchy redness with itching during IV infusion localized to arm.    Past Medical History, Surgical history, Social history, and Family History were reviewed and updated.  Review of Systems: Review of Systems  Constitutional: Positive for malaise/fatigue.  HENT: Negative.   Eyes: Negative.   Respiratory: Negative.   Cardiovascular: Positive for palpitations.  Gastrointestinal: Positive for heartburn.  Genitourinary: Negative.   Musculoskeletal: Positive for back pain.  Skin: Negative.   Neurological: Positive for dizziness and tingling.  Endo/Heme/Allergies: Negative.   Psychiatric/Behavioral: Negative.      Physical Exam:  oral temperature is 98.7 F (37.1 C). Her blood pressure is 186/74 (abnormal) and her pulse is 71. Her oxygen saturation is 99%.   Wt Readings from Last 3 Encounters:  04/22/20 140 lb (63.5 kg)  03/26/20 145 lb 8.1 oz (66 kg)  02/05/20 144 lb 3.2 oz (65.4 kg)    Physical Exam Vitals reviewed.  HENT:     Head: Normocephalic and atraumatic.     Mouth/Throat:     Mouth: Oropharynx is clear and moist.  Eyes:     Extraocular Movements: EOM normal.     Pupils: Pupils are equal, round, and reactive to light.  Cardiovascular:     Rate and Rhythm: Normal rate and regular rhythm.     Heart sounds: Normal heart sounds.  Pulmonary:     Effort: Pulmonary effort is normal.     Breath sounds: Normal breath sounds.   Abdominal:     General: Bowel sounds are normal.     Palpations: Abdomen is soft.     Comments: Her abdomen is soft.  She has a colostomy in the left lower quadrant.  There is no abdominal distention.  There is no fluid wave.  She has well-healed laparotomy scar.  She has no palpable abdominal mass.  There is no palpable liver or spleen tip.  Musculoskeletal:        General: No tenderness, deformity or edema. Normal range of motion.     Cervical back: Normal range of motion.  Lymphadenopathy:     Cervical: No cervical adenopathy.  Skin:    General: Skin  is warm and dry.     Findings: No erythema or rash.  Neurological:     Mental Status: She is alert and oriented to person, place, and time.  Psychiatric:        Mood and Affect: Mood and affect normal.        Behavior: Behavior normal.        Thought Content: Thought content normal.        Judgment: Judgment normal.      Lab Results  Component Value Date   WBC 5.0 05/20/2020   HGB 11.9 (L) 05/20/2020   HCT 35.9 (L) 05/20/2020   MCV 93.5 05/20/2020   PLT 260 05/20/2020   Lab Results  Component Value Date   FERRITIN 614 (H) 05/20/2020   IRON 55 05/20/2020   TIBC 220 (L) 05/20/2020   UIBC 165 05/20/2020   IRONPCTSAT 25 05/20/2020   Lab Results  Component Value Date   RETICCTPCT 0.5 (L) 10/20/2017   RBC 3.84 (L) 05/20/2020   No results found for: KPAFRELGTCHN, LAMBDASER, KAPLAMBRATIO No results found for: IGGSERUM, IGA, IGMSERUM No results found for: Odetta Pink, SPEI   Chemistry      Component Value Date/Time   NA 138 05/20/2020 0955   NA 136 (A) 09/08/2017 0000   K 3.7 05/20/2020 0955   CL 102 05/20/2020 0955   CO2 29 05/20/2020 0955   BUN 13 05/20/2020 0955   BUN 8 09/08/2017 0000   CREATININE 0.77 05/20/2020 0955   CREATININE 0.69 08/02/2016 1519   GLU 111 09/08/2017 0000      Component Value Date/Time   CALCIUM 9.9 05/20/2020 0955   ALKPHOS 52  05/20/2020 0955   AST 13 (L) 05/20/2020 0955   ALT 7 05/20/2020 0955   BILITOT 0.3 05/20/2020 0955       Impression and Plan: Ms. Brand is a very pleasant 79 yo caucasian female withrecurrent undifferentiated endometrial carcinoma.  We will hold off on her treatment today.  Again she does not feel that well.  Her last treatment was back in September.  Hopefully we can go to 74-monthtreatments.  We will see about doing a CT scan when we get her back.  She would like to have this before we see her just because of the oral contrast and the fact that it causes problems with diarrhea.  Hopefully, she will be able to have a nice Christmas.  I just want her to be able to enjoy her quality of life.  Again, quality of life is our goal.  Hopefully she will be able to get her blood pressure under better control.  Her cholesterol is also incredibly high with a cholesterol/HDL ratio 7.4.  She cannot tolerate statin drugs.  I suspect she probably is going to need one of the new injectable cholesterol agents.  She will see orthopedic surgery for injections.  We will get her back in 3 weeks and see how she is feeling. We will proceed with treatment today as planned and then go to every 12 week appointments (ok with Dr. EMarin Olp.  We will get repeat CT scans of the chest abdomen and pelvis to assess her response to treatment.  We will also get and [place] left leg to r/o thrombus.  Iron studies are pending. We will replace if needed.  They are in agreement with the plan and will contact our office with any questions or concerns.   PVolanda Napoleon MD  12/21/20212:03 PM

## 2020-05-21 ENCOUNTER — Encounter: Payer: Self-pay | Admitting: Adult Health

## 2020-05-21 ENCOUNTER — Ambulatory Visit (INDEPENDENT_AMBULATORY_CARE_PROVIDER_SITE_OTHER): Payer: Medicare Other | Admitting: Adult Health

## 2020-05-21 VITALS — BP 144/72 | HR 74 | Ht 61.0 in | Wt 140.0 lb

## 2020-05-21 DIAGNOSIS — I480 Paroxysmal atrial fibrillation: Secondary | ICD-10-CM

## 2020-05-21 DIAGNOSIS — I63412 Cerebral infarction due to embolism of left middle cerebral artery: Secondary | ICD-10-CM | POA: Diagnosis not present

## 2020-05-21 DIAGNOSIS — I10 Essential (primary) hypertension: Secondary | ICD-10-CM

## 2020-05-21 DIAGNOSIS — E785 Hyperlipidemia, unspecified: Secondary | ICD-10-CM | POA: Diagnosis not present

## 2020-05-21 NOTE — Progress Notes (Signed)
[facility] [address] [place]. [place] [address] [phone]       HOSPITAL FOLLOW UP NOTE  [name] Date of Birth:  [birth year] Medical Record Number:  [id number]   Reason for Referral:  hospital stroke follow up    SUBJECTIVE:   CHIEF COMPLAINT:  Chief Complaint  Patient presents with  . Hospitalization Follow-up    Rm 9, with daughter, reports difficulty speaking and thinking clearly     HPI:   [name] is a 79 y.o. female with history of asthma, CAD, HLD, afib,HTN, Uterina Ca locally advanced who presented to [facility] ED on 03/26/2020 with expressive aphasia, R sided HA and neck pain.  Personally reviewed hospitalization pertinent progress notes, lab work and imaging with summary provided.  Transfer to [facility] ED evaluated by Dr. Leonie Man with stroke work-up revealing left MCA infarct in setting of L M2 and possible M3 branch irregularity, embolic secondary to known AF not on AC.  Considered IR due to worsening following admission but felt not to be a candidate.  Not previously on [facility] as episode of AF in 2019 secondary to stress from colonoscopy prep and plan for [facility] if recurred.  In setting of recent stroke, recommend initiating Eliquis for secondary stroke prevention.  Uncontrolled HTN with BP 210/76 on diltiazem.  History of HLD with statin intolerance (myalgias and memory loss) with LDL 358 and advised outpatient cardiology follow-up for initiation of PCSK9 injections.  Other stroke risk factors include advanced age and CAD.  Other active problems include uterine cancer w/ colonic mets s/p colostomy.  Residual deficits of expressive> receptive aphasia.  Per therapy recommendations, she was discharged home with [facility].   Stroke:   L MCA infarct embolic secondary to known AF not on AC  CT head No acute abnormality. R basal ganglia lacune new from 09/2010. R subinsular white matter infarct. Atrophy.   MRI  Inferior L parietal  lobe and posterior insular infarct. Small vessel disease. Chronic R basal ganglia lacune.  CTA head & neck no new infarct. L M2 and possible M3 branch irregularilty. ICA < 50% plaque. Irregular plaque origin L ICA.  CT perfusion no core, 71m penumbra.  2D Echo EF 60-65%. No source of embolus    LDL 358 mg percent  HgbA1c pending      VTE prophylaxis - Lovenox 40 mg sq daily   No antithrombotic prior to admission, received aspirin 81 10/27, now on Eliquis (apixaban) daily for secondary stroke prevention  [facility]  Disposition:   Home  Today, 05/21/2020, [name] being seen for hospital follow-up accompanied by her daughter in person and son via telephone.  Reports residual aphasia but overall greatly improving. [facility] then released. She does admit to occasional difficulty understanding what people are saying but believes this is more due to difficulty hearing.  She has been having some mild balance difficulties. Use of cane which she used PTA with chronic pain receiving injections by orthopedics.  Denies new or worsening stroke/TIA symptoms.  Remains on Eliquis 5 mg twice daily tolerating well without side effects.  Blood pressure today 144/72.  She has follow-up with cardiology scheduled on 06/02/2020 and plans on further discussing initiation of PCSK9 inhibitor.  No concerns at this time.    ROS:   14 system review of systems performed and negative with exception of speech difficulties  PMH:  Past Medical History:  Diagnosis Date  . A-fib ([place]) 03/26/2020  . Acute CVA (cerebrovascular accident) ([place]) 03/26/2020  . Arthritis   . Asthma    allergy induced  . Bilateral carotid artery disease ([facility])    L carotid bruit  . Bursitis    left hip  . CAD (coronary artery disease)   . Cancer ([place])   . Dyslipidemia    intolerant to statins, welchol, niacin, zetia  . Goals of care,  counseling/discussion 10/11/2017  . History of blood transfusion   . History of nuclear stress test 04/24/2012   lexiscan; normal study  . Hypertension   . Hypothyroidism   . 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 ([place]) 10/11/2017  . Postoperative nausea and vomiting 01/02/2016    PSH:  Past Surgical History:  Procedure Laterality Date  . ABDOMINAL HYSTERECTOMY    . Carotid Doppler  02/2012   40-59% right int carotid artery stenosis; 60-79% L int carotid stenosis; L carotid bruit  . CORONARY ARTERY BYPASS GRAFT  03/12/2004   LIMA to LAD, SVG to circumflex, SVG to PDA  . CYSTOSCOPY W/ URETERAL STENT PLACEMENT Left 12/06/2017   Procedure: CYSTOSCOPY WITH LEFT URETERAL STENT EXCHANGE;  Surgeon: Irine Seal, MD;  Location: [facility] ORS;  Service: Urology;  Laterality: Left;  . CYSTOSCOPY WITH STENT PLACEMENT Bilateral 08/21/2017   Procedure: CYSTOSCOPY WITH STENT PLACEMENT;  Surgeon: Ceasar Mons, MD;  Location: [facility] ORS;  Service: Urology;  Laterality: Bilateral;  . IR FLUORO GUIDE PORT INSERTION RIGHT  10/11/2017  . IR GENERIC HISTORICAL  04/29/2016   IR RADIOLOGIST EVAL & MGMT 04/29/2016 Sandi Mariscal, MD [facility] INTERV RAD  . IR GENERIC HISTORICAL  05/12/2016   IR RADIOLOGIST EVAL & MGMT 05/12/2016 Sandi Mariscal, MD [facility] INTERV RAD  . IR IVC FILTER PLMT / S&I /IMG GUID/MOD SED  10/11/2017  . IR US GUIDE VASC ACCESS RIGHT  10/11/2017  . ROBOTIC ASSISTED TOTAL HYSTERECTOMY WITH BILATERAL SALPINGO OOPHERECTOMY Bilateral 12/30/2015   Procedure: XI ROBOTIC ASSISTED TOTAL HYSTERECTOMY WITH BILATERAL SALPINGO OOPHORECTOMY WITH SENTAL LYMPH NODE BIOPSY;  Surgeon: Nancy Marus, MD;  Location: [facility]S;  Service: Gynecology;  Laterality: Bilateral;  . TONSILLECTOMY    . TRANSTHORACIC ECHOCARDIOGRAM  04/2007   EF>55%; mild MR; mild-mod TR; mild pulm HTN; mild calcification of aortiv valve leaflets with mild valvular aortic stenosis  . TUBAL LIGATION      Social  History:  Social History   Socioeconomic History  . Marital status: Widowed    Spouse name: Not on file  . Number of children: 2  . Years of education: Not on file  . Highest education level: Not on file  Occupational History    Employer: RETIRED  Tobacco Use  . Smoking status: Never Smoker  . Smokeless tobacco: Never Used  Vaping Use  . Vaping Use: Never used  Substance and Sexual Activity  . Alcohol use: No  . Drug use: No  . Sexual activity: Not Currently  Other Topics Concern  . Not on file  Social History Narrative  . Not on file   Social Determinants of Health   Financial Resource Strain: Not on file  Food Insecurity: Not on file  Transportation Needs: Not on file  Physical Activity: Not on file  Stress: Not on file  Social Connections: Not on file  Intimate Partner Violence: Not on file    Family History:  Family History  Problem Relation Age of Onset  . Hypertension Mother   .  Heart disease Mother        Died in her 19s  . Stroke Father   . Kidney disease Brother   . Heart disease Brother        also HTN, hyperlipidemia  . Heart attack Brother   . Stroke Sister        x2  . Hypertension Sister     Medications:   Current Outpatient Medications on File Prior to Visit  Medication Sig Dispense Refill  . acetaminophen (TYLENOL) 500 MG tablet     . apixaban (ELIQUIS) 5 MG TABS tablet Take 1 tablet (5 mg total) by mouth 2 (two) times daily. 60 tablet 0  . clobetasol ointment (TEMOVATE) 7.61 % Apply 1 application topically 3 (three) times a week. 30 g 0  . diltiazem (TIAZAC) 300 MG 24 hr capsule TAKE 1 CAPSULE(300 MG) BY MOUTH DAILY (Patient taking differently: Take 300 mg by mouth daily.) 30 capsule 1  . levothyroxine (SYNTHROID, LEVOTHROID) 50 MCG tablet Take 1 tablet (50 mcg total) by mouth daily before breakfast. 30 tablet 1  . lidocaine (XYLOCAINE) 5 % ointment Apply 1 application topically as needed. Up to TID for treatment of burning symptoms of the  vulva 35.44 g 0  . lidocaine-prilocaine (EMLA) cream Apply a dime size to port-a-cath 1-2 hours prior to access. Cover with Saran wrap. 30 g 2  . ondansetron (ZOFRAN-ODT) 8 MG disintegrating tablet 1 tablet on the tongue and allow to dissolve  as needed    . polyethylene glycol (MIRALAX / GLYCOLAX) 17 g packet Take 17 g by mouth daily as needed for severe constipation. 14 each 0  . sennosides-docusate sodium (SENOKOT-S) 8.6-50 MG tablet 1 tablet in the evening as needed    . traMADol (ULTRAM) 50 MG tablet Take two tablets (100 mg) by mouth twice daily. 120 tablet 0  . valACYclovir (VALTREX) 500 MG tablet Take 1 tablet (500 mg total) by mouth daily. 30 tablet 4   Current Facility-Administered Medications on File Prior to Visit  Medication Dose Route Frequency Provider Last Rate Last Admin  . sodium chloride flush (NS) 0.9 % injection 10 mL  10 mL Intracatheter PRN Volanda Napoleon, MD   10 mL at 02/05/20 1440    Allergies:   Allergies  Allergen Reactions  . Statins Other (See Comments)    Myalgias and memory problems  . Ciprofloxacin Itching    Splotchy redness with itching during IV infusion localized to arm.      OBJECTIVE:  Physical Exam  Vitals:   05/21/20 1306  BP: (!) 144/72  Pulse: 74  Weight: 140 lb (63.5 kg)  Height: 5' 1"  (1.549 m)   Body mass index is 26.45 kg/m. No exam data present  General: Frail very pleasant elderly Caucasian female, seated, in no evident distress Head: head normocephalic and atraumatic.   Neck: supple with no carotid or supraclavicular bruits Cardiovascular: regular rate and rhythm, no murmurs Musculoskeletal: no deformity Skin:  no rash/petichiae Vascular:  Normal pulses all extremities   Neurologic Exam Mental Status: Awake and fully alert.   Unable to appreciate aphasia or dysarthria.  No evidence of receptive aphasia.  Oriented to place and time. Recent and remote memory intact. Attention span, concentration and fund of knowledge  appropriate during visit. Mood and affect appropriate.  Cranial Nerves: Fundoscopic exam reveals sharp disc margins. Pupils equal, briskly reactive to light. Extraocular movements full without nystagmus. Visual fields full to confrontation.  HOH bilaterally. Facial sensation intact. Face, tongue,  palate moves normally and symmetrically.  Motor: Normal bulk and tone. Normal strength in all tested extremity muscles although limited testing LLE due to hip pain Sensory.: intact to touch , pinprick , position and vibratory sensation.  Coordination: Rapid alternating movements normal in all extremities. Finger-to-nose and heel-to-shin performed accurately bilaterally. Gait and Station: Arises from chair without difficulty. Stance is normal. Gait demonstrates normal stride length and balance with some favoring of left leg and use of cane Reflexes: 1+ and symmetric. Toes downgoing.     NIHSS  0 Modified Rankin  1      ASSESSMENT: Shannon Scott is a 79 y.o. year old female presented with expressive aphasia, right-sided headache and neck pain on 03/26/2020 with stroke work-up revealing left MCA infarct, embolic secondary to known AF not on AC. Vascular risk factors include atrial fibrillation, prior strokes on imaging, uncontrolled HTN, HLD with history of statin intolerance, advanced age and CAD.      PLAN:  1. L MCA stroke :  a. Residual deficit: Occasional expressive aphasia but overall greatly improved.  Encouraged continued exercises as recommended by [facility].  No indication or need for additional therapies at this time.   b. Continue Eliquis (apixaban) daily for secondary stroke prevention.   c. Discussed secondary stroke prevention measures and importance of close PCP follow up for aggressive stroke risk factor management  2. A. Fib: CHA2DS2-VASc score at least 7 for age, female, HTN, vascular disease and stroke.  Discussed importance and indication of ongoing use of Eliquis which will be  managed by cardiology ongoing 3. HTN: BP goal <130/90.  Stable today and managed by PCP 4. HLD: LDL goal <70. Recent LDL 358.  History of statin intolerance.  Schedule cardiology follow-up 06/02/2020 to further discuss initiating PCSK9 inhibitor    Follow up in 4 months or call earlier if needed   I spent 45 minutes of face-to-face and non-face-to-face time with patient, son and daughter.  This included previsit chart review including recent hospitalization pertinent progress notes, lab work and imaging, lab review, study review, order entry, electronic health record documentation, patient education regarding recent stroke including etiology and indication for [facility], residual deficits, importance of managing stroke risk factors and answered all questions to patient and family's satisfaction   Frann Rider, AGNP-BC  [facility] [address] [place], [place] [address]  Phone [phone] Fax [phone] Note: This document was prepared with digital dictation and possible smart phrase technology. Any transcriptional errors that result from this process are unintentional.

## 2020-05-21 NOTE — Patient Instructions (Addendum)
Continue Eliquis (apixaban) daily for secondary stroke prevention  Ensure follow up with your cardiologist to discuss atrial fibrillation and eliquis as well as starting Repatha or Praulent   Continue to follow up with PCP regarding cholesterol and blood pressure management  Maintain strict control of hypertension with blood pressure goal below 130/90 and cholesterol with LDL cholesterol (bad cholesterol) goal below 70 mg/dL.      Followup in the future with me in 4 months or call earlier if needed     Thank you for coming to see [place] at [facility]. I hope we have been able to provide you high quality care today.  You may receive a patient satisfaction survey over the next few weeks. We would appreciate your feedback and comments so that we may continue to improve ourselves and the health of our patients.    Stroke Prevention Some medical conditions and lifestyle choices can lead to a higher risk for a stroke. You can help to prevent a stroke by making nutrition, lifestyle, and other changes. What nutrition changes can be made?   Eat healthy foods. ? Choose foods that are high in fiber. These include:  Fresh fruits.  Fresh vegetables.  Whole grains. ? Eat at least 5 or more servings of fruits and vegetables each day. Try to fill half of your plate at each meal with fruits and vegetables. ? Choose lean protein foods. These include:  Lowfat (lean) cuts of meat.  Chicken without skin.  Fish.  Tofu.  Beans.  Nuts. ? Eat low-fat dairy products. ? Avoid foods that:  Are high in salt (sodium).  Have saturated fat.  Have trans fat.  Have cholesterol.  Are processed.  Are premade.  Follow eating guidelines as told by your doctor. These may include: ? Reducing how many calories you eat and drink each day. ? Limiting how much salt you eat or drink each day to 1,500 milligrams (mg). ? Using only healthy fats for cooking. These include:  Olive  oil.  Canola oil.  Sunflower oil. ? Counting how many carbohydrates you eat and drink each day. What lifestyle changes can be made?  Try to stay at a healthy weight. Talk to your doctor about what a good weight is for you.  Get at least 30 minutes of moderate physical activity at least 5 days a week. This can include: ? Fast walking. ? Biking. ? Swimming.  Do not use any products that have nicotine or tobacco. This includes cigarettes and e-cigarettes. If you need help quitting, ask your doctor. Avoid being around tobacco smoke in general.  Limit how much alcohol you drink to no more than 1 drink a day for nonpregnant women and 2 drinks a day for men. One drink equals 12 oz of beer, 5 oz of wine, or 1 oz of hard liquor.  Do not use drugs.  Avoid taking birth control pills. Talk to your doctor about the risks of taking birth control pills if: ? You are over 92 years old. ? You smoke. ? You get migraines. ? You have had a blood clot. What other changes can be made?  Manage your cholesterol. ? It is important to eat a healthy diet. ? If your cholesterol cannot be managed through your diet, you may also need to take medicines. Take medicines as told by your doctor.  Manage your diabetes. ? It is important to eat a healthy diet and to exercise regularly. ? If your blood sugar cannot be  managed through diet and exercise, you may need to take medicines. Take medicines as told by your doctor.  Control your high blood pressure (hypertension). ? Try to keep your blood pressure below 130/80. This can help lower your risk of stroke. ? It is important to eat a healthy diet and to exercise regularly. ? If your blood pressure cannot be managed through diet and exercise, you may need to take medicines. Take medicines as told by your doctor. ? Ask your doctor if you should check your blood pressure at home. ? Have your blood pressure checked every year. Do this even if your blood pressure is  normal.  Talk to your doctor about getting checked for a sleep disorder. Signs of this can include: ? Snoring a lot. ? Feeling very tired.  Take over-the-counter and prescription medicines only as told by your doctor. These may include aspirin or blood thinners (antiplatelets or anticoagulants).  Make sure that any other medical conditions you have are managed. Where to find more information  American Stroke Association: www.strokeassociation.org  National Stroke Association: www.stroke.org Get help right away if:  You have any symptoms of stroke. "BE FAST" is an easy way to remember the main warning signs: ? B - Balance. Signs are dizziness, sudden trouble walking, or loss of balance. ? E - Eyes. Signs are trouble seeing or a sudden change in how you see. ? F - Face. Signs are sudden weakness or loss of feeling of the face, or the face or eyelid drooping on one side. ? A - Arms. Signs are weakness or loss of feeling in an arm. This happens suddenly and usually on one side of the body. ? S - Speech. Signs are sudden trouble speaking, slurred speech, or trouble understanding what people say. ? T - Time. Time to call emergency services. Write down what time symptoms started.  You have other signs of stroke, such as: ? A sudden, very bad headache with no known cause. ? Feeling sick to your stomach (nausea). ? Throwing up (vomiting). ? Jerky movements you cannot control (seizure). These symptoms may represent a serious problem that is an emergency. Do not wait to see if the symptoms will go away. Get medical help right away. Call your local emergency services (911 in the [place] Do not drive yourself to the hospital. Summary  You can prevent a stroke by eating healthy, exercising, not smoking, drinking less alcohol, and treating other health problems, such as diabetes, high blood pressure, or high cholesterol.  Do not use any products that contain nicotine or tobacco, such as cigarettes  and e-cigarettes.  Get help right away if you have any signs or symptoms of a stroke. This information is not intended to replace advice given to you by your health care provider. Make sure you discuss any questions you have with your health care provider. Document Revised: 07/13/2018 Document Reviewed: 08/18/2016 Elsevier Patient Education  [place].

## 2020-05-22 ENCOUNTER — Ambulatory Visit
Admission: RE | Admit: 2020-05-22 | Discharge: 2020-05-22 | Disposition: A | Discharge status: Home / Self Care | Payer: Medicare Other | Source: Ambulatory Visit | Attending: Physical Medicine and Rehabilitation | Admitting: Physical Medicine and Rehabilitation

## 2020-05-22 ENCOUNTER — Other Ambulatory Visit: Payer: Self-pay

## 2020-05-22 DIAGNOSIS — M533 Sacrococcygeal disorders, not elsewhere classified: Secondary | ICD-10-CM

## 2020-05-22 MED ORDER — METHYLPREDNISOLONE ACETATE 40 MG/ML INJ SUSP (RADIOLOG
120.0000 mg | Freq: Once | INTRAMUSCULAR | Status: AC
  Administered 2020-05-22: 120 mg via INTRA_ARTICULAR

## 2020-05-22 MED ORDER — IOPAMIDOL (ISOVUE-M 200) INJECTION 41%
1.0000 mL | Freq: Once | INTRAMUSCULAR | Status: AC
  Administered 2020-05-22: 1 mL via INTRA_ARTICULAR

## 2020-05-22 NOTE — Discharge Instructions (Signed)

## 2020-05-22 NOTE — Progress Notes (Signed)
I agree with the above plan 

## 2020-06-01 NOTE — Progress Notes (Deleted)
Gynecologic Oncology Return Clinic Visit  @DATE @  Reason for Visit: ***  Treatment History: Oncology History Overview Note  The patient began noticing postmenopausal bleeding in June, 2017. She saw her primary care physician who determined there was no blood in the urine, and she was then referred to Dr Benjie Karvonen who, on 11/21/15 performed a TVUS which showed a uterus measuring 6x2.7x3.3cm with normal ovaries. There was a thickened endometrium of 6.51m.    [place]  11/21/2015 Initial Diagnosis   [place] A pipelle endometrial biopsy was performed on 11/21/15 which showed FIGO grade 1-2 endometrial cancer.  The pap from the same date was normal.   12/30/2015 Surgery   Robotic assisted total hysterectomy, BSO, pelvic and PA SLN biopsy. 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  Postoperative Diagnosis: same.  Surgery: Total robotic hysterectomy bilateral salpingo-oophorectomy, left pelvic, right para-aortic and bifurcation SLN removal  Operative findings:  1) Colon adherent to posterior uterus, left sidewall and appendix 2) 2 right para-aortic nodes for SLN (high and PA node) 3) Aortic bifurcation nodes 4) Left obturator node  Diagnosis 1. Lymph node, sentinel, biopsy, right obturator - ONE BENIGN LYMPH NODE (0/1). 2. Lymph node, sentinel, biopsy, right peri-aortic - ONE BENIGN LYMPH NODE (0/1). 3. Lymph node, sentinel, biopsy, right lower peri-aortic - ONE BENIGN LYMPH NODE (0/1). 4. Lymph node, sentinel, biopsy, aortic bifurcation - ONE BENIGN LYMPH NODE (0/1). 5. Lymph node, sentinel, biopsy, left obturator - ONE BENIGN LYMPH NODE (0/1). 6. Uterus +/- tubes/ovaries, neoplastic - ENDOMETRIAL ADENOCARCINOMA, 1.7 CM WITH SUPERFICIAL MYOMETRIAL INVASION. - MARGINS NOT INVOLVED. - CERVIX, BILATERAL OVARIES AND BILATERAL FALLOPIAN TUBES FREE OF TUMOR. Microscopic Comment 6. ONCOLOGY TABLE-UTERUS, CARCINOMA OR  CARCINOSARCOMA Specimen: Uterus with bilateral fallopian tubes and ovaries and sentinel lymph node biopsies. Procedure: Hysterectomy with sentinel lymph nodes. Lymph node sampling performed: Yes. Specimen integrity: Intact. Maximum tumor size: 1.7 cm Histologic type: Endometrioid with squamous differentiation. Grade: 2 Myometrial invasion: 0.3 cm where myometrium is 1 cm in thickness Cervical stromal involvement: No. Extent of involvement of other organs: None, identified. Lymph - vascular invasion: Not identified.    05/13/2016 Imaging   05/03/16 CT guided drainage: IMPRESSION: Successful CT guided aspiration of approximately 15 cc of serous, slightly cloudy / milky fluid from the residual collection within the left hemipelvis. All aspirated samples were sent to the laboratory for cytologic and gram stain analysis.  Cytology was negative   07/2017 Progression   ELORAY AKARDin February 2019 presented to her primary physician with complaints of rectal bleeding.  She was scheduled for a colonoscopy and had taken the bowel prep when she presented with obstruction of her distal sigmoid colon.   08/18/2017 Imaging   maging 08/18/2017 The left ureter is dilated to the left hemipelvis. It becomes narrowed in the inflammatory pelvic mass. This is described below. Bladder is within normal limits.   Stomach/Bowel: Large hiatal hernia is unchanged. Stable prominent gastric folds within the hiatal hernia.   The colon is diffusely distended. Distal small bowel is distended. A transition point between dilated large bowel and decompressed large bowel occurs in the sigmoid colon. There is a heterogeneous ill-defined mass involving the sigmoid colon and left side of the pelvis which includes the colon wall, adjacent fat, and both fluid and gas elements. Findings are suspicious for a focal perforation with abscess formation. Underlying malignancy is a strong consideration given the appearance and  colon obstruction. The mass also involves the left ureter  causing an element of left ureteral dilatation worrisome for an element of left ureteral obstruction. The gas and fluid collection is very small measuring 1.8 x 1.2 cm on  image 109 of series 4. Overall mass size including the involved colon is 5.7 x 5.3 cm.    08/21/2017 Surgery   [name] underwent a diverting laparoscopic transverse loop colostomy and subsequent placement of a left ureteral stent.   08/2017 -  Radiation Therapy   She then received pelvic external beam radiation therapy    10/13/2017 - 11/17/2017 Chemotherapy   The patient had dexamethasone (DECADRON) 4 MG tablet, 8 mg, Oral, Daily, 1 of 1 cycle, Start date: --, End date: -- palonosetron (ALOXI) injection 0.25 mg, 0.25 mg, Intravenous,  Once, 6 of 6 cycles Administration: 0.25 mg (10/13/2017), 0.25 mg (10/20/2017), 0.25 mg (11/03/2017), 0.25 mg (11/10/2017), 0.25 mg (11/17/2017) CARBOplatin (PARAPLATIN) 150 mg in sodium chloride 0.9 % 100 mL chemo infusion, 150 mg (100 % of original dose 148 mg), Intravenous,  Once, 6 of 6 cycles Dose modification:   (original dose 148 mg, Cycle 1) Administration: 150 mg (10/13/2017), 150 mg (10/20/2017), 150 mg (11/03/2017), 150 mg (11/10/2017), 150 mg (11/17/2017) PACLitaxel (TAXOL) 132 mg in sodium chloride 0.9 % 250 mL chemo infusion (</= 21m/m2), 80 mg/m2 = 132 mg, Intravenous,  Once, 6 of 6 cycles Dose modification: 45 mg/m2 (original dose 80 mg/m2, Cycle 2, Reason: Other (see comments), Comment: Decreasing dose to 45 mg/m2 while undergoing XRT) Administration: 132 mg (10/13/2017), 72 mg (10/20/2017), 72 mg (11/03/2017), 72 mg (11/10/2017), 72 mg (11/17/2017)  for chemotherapy treatment.    01/18/2018 Imaging   notable for the presence of an IVC filter and 11 mm left internal iliac node that was previously 1.6 cm and a soft tissue mass in the left pelvic sidewall and now measuring 3.9 x 2.5 cm.   03/03/2018 Imaging   PET  demonstrates a  hypermetabolic left pelvic sidewall mass, greater than 3 weeks after completion of radiotherapy.   03/16/2018 -  Chemotherapy   The patient had pembrolizumab (KEYTRUDA) 200 mg in sodium chloride 0.9 % 50 mL chemo infusion, 200 mg, Intravenous, Once, 19 of 22 cycles Dose modification: 400 mg (original dose 200 mg, Cycle 11, Reason: Other (see comments), Comment: change to q 6 week) Administration: 200 mg (03/16/2018), 200 mg (04/06/2018), 200 mg (05/04/2018), 200 mg (05/25/2018), 200 mg (10/10/2018), 200 mg (06/15/2018), 200 mg (07/27/2018), 200 mg (10/31/2018), 200 mg (11/21/2018), 200 mg (01/03/2019), 400 mg (01/23/2019), 400 mg (03/07/2019), 400 mg (04/17/2019), 400 mg (06/08/2019), 400 mg (08/02/2019), 400 mg (09/12/2019), 400 mg (10/24/2019), 400 mg (12/04/2019), 400 mg (02/05/2020)  for chemotherapy treatment.    04/2018 Miscellaneous    CA 125  Ref. Range 05/04/2018 12:08 05/25/2018 11:21 06/15/2018 13:30 10/10/2018 12:20 01/23/2019 11:35  Cancer Antigen (CA) 125 Latest Ref Range: 0.0 - 38.1 U/mL 11.2 10.9 10.2 10.9 9.2     10/05/2018 Imaging   PET  3. Continued positive response to therapy within the left pelvic sidewall mass, which demonstrates residual low level hypermetabolism that has mildly decreased. 4. Resolved mediastinal nodal hypermetabolism. Nearly resolved bilateral hilar nodal hypermetabolism. These findings could represent resolving reactive nodes versus response to therapy within metastatic nodes. 5. No metabolic evidence of new sites of metastatic disease.   09/27/2019 Imaging   CT C/A/P: IMPRESSION: 1. Since the PET of 10/05/2018, similar to slight decrease in left pelvic sidewall soft tissue fullness, without well-defined mass. 2. No findings of  new or  progressive disease. 3. Chronic or recurrent left renal pelvic and proximal ureteric wall thickening/mucosal hyperenhancement. Nonspecific, especially in the setting of a prior stent. Cannot exclude ascending infection. Similarly,  improved bladder wall and surrounding edema which could simply be treatment related. Correlate with urinalysis to exclude cystitis. 4. Small hiatal hernia. 5. Transverse colostomy with similar small bowel containing parastomal hernia. 6.  Aortic Atherosclerosis (ICD10-I70.0).   03/17/2020 Imaging   CT C/A/P: 1. No evidence of cervical cancer local recurrence or metastatic disease. 2. LEFT midline colostomy with peristomal hernia. Long segment small bowel enters the hernia sac without obstruction. 3. Chronic fractures sacral insufficiency fractures     Interval History: ***  Past Medical/Surgical History: Past Medical History:  Diagnosis Date  . A-fib ([place]) 03/26/2020  . Acute CVA (cerebrovascular accident) ([place]) 03/26/2020  . Arthritis   . Asthma    allergy induced  . Bilateral carotid artery disease ([facility])    L carotid bruit  . Bursitis    left hip  . CAD (coronary artery disease)   . Cancer ([place])   . Dyslipidemia    intolerant to statins, welchol, niacin, zetia  . Goals of care, counseling/discussion 10/11/2017  . History of blood transfusion   . History of nuclear stress test 04/24/2012   lexiscan; normal study  . Hypertension   . Hypothyroidism   . 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 ([place]) 10/11/2017  . Postoperative nausea and vomiting 01/02/2016    Past Surgical History:  Procedure Laterality Date  . ABDOMINAL HYSTERECTOMY    . Carotid Doppler  02/2012   40-59% right int carotid artery stenosis; 60-79% L int carotid stenosis; L carotid bruit  . CORONARY ARTERY BYPASS GRAFT  03/12/2004   LIMA to LAD, SVG to circumflex, SVG to PDA  . CYSTOSCOPY W/ URETERAL STENT PLACEMENT Left 12/06/2017   Procedure: CYSTOSCOPY WITH LEFT URETERAL STENT EXCHANGE;  Surgeon: Irine Seal, MD;  Location: [facility];  Service: Urology;  Laterality: Left;  . CYSTOSCOPY WITH STENT PLACEMENT Bilateral 08/21/2017   Procedure: CYSTOSCOPY WITH STENT  PLACEMENT;  Surgeon: Ceasar Mons, MD;  Location: [facility];  Service: Urology;  Laterality: Bilateral;  . IR FLUORO GUIDE PORT INSERTION RIGHT  10/11/2017  . IR GENERIC HISTORICAL  04/29/2016   IR RADIOLOGIST EVAL & MGMT 04/29/2016 Sandi Mariscal, MD [facility] INTER[facility] RAD  . IR GENERIC HISTORICAL  05/12/2016   IR RADIOLOGIST EVAL & MGMT 05/12/2016 Sandi Mariscal, MD [facility] INTERV RAD  . IR IVC FILTER PLMT / S&I /IMG GUID/MOD SED  10/11/2017  . IR US GUIDE VASC ACCESS RIGHT  10/11/2017  . ROBOTIC ASSISTED TOTAL HYSTERECTOMY WITH BILATERAL SALPINGO OOPHERECTOMY Bilateral 12/30/2015   Procedure: XI ROBOTIC ASSISTED TOTAL HYSTERECTOMY WITH BILATERAL SALPINGO OOPHORECTOMY WITH SENTAL LYMPH NODE BIOPSY;  Surgeon: Nancy Marus, MD;  Location: [facility];  Service: Gynecology;  Laterality: Bilateral;  . TONSILLECTOMY    . TRANSTHORACIC ECHOCARDIOGRAM  04/2007   EF>55%; mild MR; mild-mod TR; mild pulm HTN; mild calcification of aortiv valve leaflets with mild valvular aortic stenosis  . TUBAL LIGATION      Family History  Problem Relation Age of Onset  . Hypertension Mother   . Heart disease Mother        Died in her 15s  . Stroke Father   . Kidney disease Brother   . Heart disease Brother        also HTN, hyperlipidemia  . Heart attack Brother   . Stroke Sister  x2  . Hypertension Sister     Social History   Socioeconomic History  . Marital status: Widowed    Spouse name: Not on file  . Number of children: 2  . Years of education: Not on file  . Highest education level: Not on file  Occupational History    Employer: RETIRED  Tobacco Use  . Smoking status: Never Smoker  . Smokeless tobacco: Never Used  Vaping Use  . Vaping Use: Never used  Substance and Sexual Activity  . Alcohol use: No  . Drug use: No  . Sexual activity: Not Currently  Other Topics Concern  . Not on file  Social History Narrative  . Not on file   Social Determinants of Health   Financial Resource Strain:  Not on file  Food Insecurity: Not on file  Transportation Needs: Not on file  Physical Activity: Not on file  Stress: Not on file  Social Connections: Not on file    Current Medications:  Current Outpatient Medications:  .  acetaminophen (TYLENOL) 500 MG tablet, , Disp: , Rfl:  .  apixaban (ELIQUIS) 5 MG TABS tablet, Take 1 tablet (5 mg total) by mouth 2 (two) times daily., Disp: 60 tablet, Rfl: 0 .  clobetasol ointment (TEMOVATE) 1.88 %, Apply 1 application topically 3 (three) times a week., Disp: 30 g, Rfl: 0 .  diltiazem (TIAZAC) 300 MG 24 hr capsule, TAKE 1 CAPSULE(300 MG) BY MOUTH DAILY (Patient taking differently: Take 300 mg by mouth daily.), Disp: 30 capsule, Rfl: 1 .  levothyroxine (SYNTHROID, LEVOTHROID) 50 MCG tablet, Take 1 tablet (50 mcg total) by mouth daily before breakfast., Disp: 30 tablet, Rfl: 1 .  lidocaine (XYLOCAINE) 5 % ointment, Apply 1 application topically as needed. Up to TID for treatment of burning symptoms of the vulva, Disp: 35.44 g, Rfl: 0 .  lidocaine-prilocaine (EMLA) cream, Apply a dime size to port-a-cath 1-2 hours prior to access. Cover with Saran wrap., Disp: 30 g, Rfl: 2 .  ondansetron (ZOFRAN-ODT) 8 MG disintegrating tablet, 1 tablet on the tongue and allow to dissolve  as needed, Disp: , Rfl:  .  polyethylene glycol (MIRALAX / GLYCOLAX) 17 g packet, Take 17 g by mouth daily as needed for severe constipation., Disp: 14 each, Rfl: 0 .  sennosides-docusate sodium (SENOKOT-S) 8.6-50 MG tablet, 1 tablet in the evening as needed, Disp: , Rfl:  .  traMADol (ULTRAM) 50 MG tablet, Take two tablets (100 mg) by mouth twice daily., Disp: 120 tablet, Rfl: 0 .  valACYclovir (VALTREX) 500 MG tablet, Take 1 tablet (500 mg total) by mouth daily., Disp: 30 tablet, Rfl: 4 No current facility-administered medications for this visit.  Facility-Administered Medications Ordered in Other Visits:  .  sodium chloride flush (NS) 0.9 % injection 10 mL, 10 mL, Intracatheter,  PRN, Volanda Napoleon, MD, 10 mL at 02/05/20 1440  Review of Systems: Denies appetite changes, fevers, chills, fatigue, unexplained weight changes. Denies hearing loss, neck lumps or masses, mouth sores, ringing in ears or voice changes. Denies cough or wheezing.  Denies shortness of breath. Denies chest pain or palpitations. Denies leg swelling. Denies abdominal distention, pain, blood in stools, constipation, diarrhea, nausea, vomiting, or early satiety. Denies pain with intercourse, dysuria, frequency, hematuria or incontinence. Denies hot flashes, pelvic pain, vaginal bleeding or vaginal discharge.   Denies joint pain, back pain or muscle pain/cramps. Denies itching, rash, or wounds. Denies dizziness, headaches, numbness or seizures. Denies swollen lymph nodes or glands, denies easy bruising  or bleeding. Denies anxiety, depression, confusion, or decreased concentration.  Physical Exam: There were no vitals taken for this visit. General: ***Alert, oriented, no acute distress. HEENT: ***Posterior oropharynx clear, sclera anicteric. Chest: ***Clear to auscultation bilaterally.  ***Port site clean. Cardiovascular: ***Regular rate and rhythm, no murmurs. Abdomen: ***Obese, soft, nontender.  Normoactive bowel sounds.  No masses or hepatosplenomegaly appreciated.  ***Well-healed scar. Extremities: ***Grossly normal range of motion.  Warm, well perfused.  No edema bilaterally. Skin: ***No rashes or lesions noted. Lymphatics: ***No cervical, supraclavicular, or inguinal adenopathy. GU: Normal appearing external genitalia without erythema, excoriation, or lesions.  Speculum exam reveals ***.  Bimanual exam reveals ***.  ***Rectovaginal exam  confirms ___.  Laboratory & Radiologic Studies: ***  Assessment & Plan: DEMETRIUS BARRELL is a 80 y.o. woman with Stage *** who presents for ***.  ***  *** minutes of total time was spent for this patient encounter, including preparation,  face-to-face counseling with the patient and coordination of care, and documentation of the encounter.  Jeral Pinch, MD  Division of Gynecologic Oncology  Department of Obstetrics and Gynecology  [facility]

## 2020-06-02 ENCOUNTER — Telehealth (INDEPENDENT_AMBULATORY_CARE_PROVIDER_SITE_OTHER): Payer: Medicare Other | Admitting: Physician Assistant

## 2020-06-02 ENCOUNTER — Telehealth: Payer: Self-pay | Admitting: Physician Assistant

## 2020-06-02 ENCOUNTER — Telehealth: Payer: Self-pay

## 2020-06-02 ENCOUNTER — Inpatient Hospital Stay: Payer: Medicare Other | Admitting: Gynecologic Oncology

## 2020-06-02 DIAGNOSIS — I480 Paroxysmal atrial fibrillation: Secondary | ICD-10-CM

## 2020-06-02 DIAGNOSIS — I10 Essential (primary) hypertension: Secondary | ICD-10-CM | POA: Diagnosis not present

## 2020-06-02 DIAGNOSIS — E785 Hyperlipidemia, unspecified: Secondary | ICD-10-CM

## 2020-06-02 DIAGNOSIS — I25810 Atherosclerosis of coronary artery bypass graft(s) without angina pectoris: Secondary | ICD-10-CM

## 2020-06-02 DIAGNOSIS — I6523 Occlusion and stenosis of bilateral carotid arteries: Secondary | ICD-10-CM

## 2020-06-02 DIAGNOSIS — E039 Hypothyroidism, unspecified: Secondary | ICD-10-CM

## 2020-06-02 DIAGNOSIS — Z8673 Personal history of transient ischemic attack (TIA), and cerebral infarction without residual deficits: Secondary | ICD-10-CM | POA: Diagnosis not present

## 2020-06-02 NOTE — Patient Instructions (Addendum)
Medication Instructions:  Your physician recommends that you continue on your current medications as directed. Please refer to the Current Medication list given to you today.  *If you need a refill on your cardiac medications before your next appointment, please call your pharmacy*  Lab Work: NONE ordered at this time of appointment   If you have labs (blood work) drawn today and your tests are completely normal, you will receive your results only by: Marland Kitchen MyChart Message (if you have MyChart) OR . A paper copy in the mail If you have any lab test that is abnormal or we need to change your treatment, we will call you to review the results.  Testing/Procedures: NONE ordered at this time of appointment   Follow-Up: At [facility], you and your health needs are our priority.  As part of our continuing mission to provide you with exceptional heart care, we have created designated Provider Care Teams.  These Care Teams include your primary Cardiologist (physician) and Advanced Practice Providers (APPs -  Physician Assistants and Nurse Practitioners) who all work together to provide you with the care you need, when you need it.  Your next appointment:   2 week(s)  3-4 months   The format for your next appointment:   In Person In Person   Provider:   [facility] K. [place] Hilty, MD  Other Instructions

## 2020-06-02 NOTE — Telephone Encounter (Signed)
Spoke with Eulas Post regarding 2 week appointmentwith the Pharm D scheduled 06/19/20 at 3:00pm and 3 mos follow up with Dr. Debara Pickett scheduled 09/04/20 at 3:00pm--will mail information to the patient and Levada Dy voiced her understanding.

## 2020-06-02 NOTE — Telephone Encounter (Signed)
Daughter Levada Dy called to r/s appointment earlier for mother today due to the weather.  Scheduled it initially for 06-06-20, however, the daughter stated that her mother used the clobetasol cream a couple of times when she got it ~11-34-21 and the symptoms improved and she stopped using it.   The burning symptoms recurred a couple of days ago.   Told the daughter that Dr. Berline Lopes wants her to resume the cream using it as prescribed three times a week until she sees Dr. Berline Lopes for follow up.  Dr. Berline Lopes said to move appointment out 2-1/2 weeks so she has some time to use the cream consistently.  Appointment was r/s to 06-30-20. Requested daughter help Shannon Scott to use cream three times a week.. Daughter verbalized understanding.

## 2020-06-02 NOTE — Progress Notes (Signed)
Virtual Visit via Telephone Note   This visit type was conducted due to national recommendations for restrictions regarding the COVID-19 Pandemic (e.g. social distancing) in an effort to limit this patient's exposure and mitigate transmission in our community.  Due to her co-morbid illnesses, this patient is at least at moderate risk for complications without adequate follow up.  This format is felt to be most appropriate for this patient at this time.  The patient did not have access to video technology/had technical difficulties with video requiring transitioning to audio format only (telephone).  All issues noted in this document were discussed and addressed.  No physical exam could be performed with this format.  Please refer to the patient's chart for her  consent to telehealth for [facility].    Date:  06/04/2020   ID:  Sharen Scott, DOB July 31, 1940, MRN [id number] The patient was identified using 2 identifiers.  Patient Location: Home Provider Location: Office/Clinic  PCP:  Leeroy Cha, MD  Cardiologist:  Pixie Casino, MD  Electrophysiologist:  None   Evaluation Performed:  Follow-Up Visit  Chief Complaint:  Follow up  History of Present Illness:    Shannon Scott is a 80 y.o. female with past medical history of HTN, HLD, CVA, A. fib, carotid artery disease, hypothyroidism and history of CAD s/p CABG in 2005.  She has been intolerant of statins, WelChol, niacin, Zetia and other cholesterol-lowering medications.  She was previously referred to [facility] in 2013 for apheresis, however was unable to tolerate the high cost.  Stress test in 2013 was negative for ischemia.  Her last visit with Dr. Debara Pickett was in April 2021 at which time she was doing well.  She was not interested in PCSK9 inhibitor however has shown possible interested toward inclisiran.  She has a history of endometrial cancer and has been followed by oncology service. She had both radiation and  chemotherapy.  Patient unfortunately had a recent admission in October 2021 with expressive aphasia and right sided headache and neck pain.  Work-up revealed a left MCA infarct.  She was started on Eliquis for stroke prevention.  She also had uncontrolled hypertension with blood pressure of 210/76.  Echocardiogram obtained during this admission showed EF 60 to 65%, grade 2 DD, moderate aortic stenosis. She was recently seen by Dr. Marin Olp of oncology service on 05/20/2020, blood pressure was quite elevated at the time.    Patient presents today for virtual visit along with her daughter.  She denies any recent chest pain or worsening shortness of breath.  She has no cardiac awareness of any palpitation.  EKG obtained during the recent hospitalization showed sinus rhythm.  It is possible she is going in and out of atrial fibrillation.  Since starting on Eliquis, she has no bleeding issues.  I did not obtain a heart monitor as it is unlikely to change her treatment plan.  We did discuss cholesterol treatment again, she is more interested to start on Repatha at this time.  The main concern still the cost.  I will refer her to the lipid clinic to start on the [place] and work with her insurance  The patient does not have symptoms concerning for COVID-19 infection (fever, chills, cough, or new shortness of breath).   160/45 153/57 145 129/53  Past Medical History:  Diagnosis Date  . A-fib ([place]) 03/26/2020  . Acute CVA (cerebrovascular accident) ([place]) 03/26/2020  . Arthritis   . Asthma    allergy induced  .  Bilateral carotid artery disease ([facility])    L carotid bruit  . Bursitis    left hip  . CAD (coronary artery disease)   . Cancer ([place])   . Dyslipidemia    intolerant to statins, welchol, niacin, zetia  . Goals of care, counseling/discussion 10/11/2017  . History of blood transfusion   . History of nuclear stress test 04/24/2012   lexiscan; normal study  . Hypertension   . Hypothyroidism   .  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 Postoperative nausea and vomiting 01/02/2016   Past Surgical History:  Procedure Laterality Date  . ABDOMINAL HYSTERECTOMY    . Carotid Doppler  02/2012   40-59% right int carotid artery stenosis; 60-79% L int carotid stenosis; L carotid bruit  . CORONARY ARTERY BYPASS GRAFT  03/12/2004   LIMA to LAD, SVG to circumflex, SVG to PDA  . CYSTOSCOPY W/ URETERAL STENT PLACEMENT Left 12/06/2017   Procedure: CYSTOSCOPY WITH LEFT URETERAL STENT EXCHANGE;  Surgeon: Irine Seal, MD;  Location: [facility];  Service: Urology;  Laterality: Left;  . CYSTOSCOPY WITH STENT PLACEMENT Bilateral 08/21/2017   Procedure: CYSTOSCOPY WITH STENT PLACEMENT;  Surgeon: Ceasar Mons, MD;  Location: [facility];  Service: Urology;  Laterality: Bilateral;  . IR FLUORO GUIDE PORT INSERTION RIGHT  10/11/2017  . IR GENERIC HISTORICAL  04/29/2016   IR RADIOLOGIST EVAL & MGMT 04/29/2016 Sandi Mariscal, MD [facility] INTERV RAD  . IR GENERIC HISTORICAL  05/12/2016   IR RADIOLOGIST EVAL & MGMT 05/12/2016 Sandi Mariscal, MD [facility] INTERV RAD  . IR IVC FILTER PLMT / S&I /IMG GUID/MOD SED  10/11/2017  . IR US GUIDE VASC ACCESS RIGHT  10/11/2017  . ROBOTIC ASSISTED TOTAL HYSTERECTOMY WITH BILATERAL SALPINGO OOPHERECTOMY Bilateral 12/30/2015   Procedure: XI ROBOTIC ASSISTED TOTAL HYSTERECTOMY WITH BILATERAL SALPINGO OOPHORECTOMY WITH SENTAL LYMPH NODE BIOPSY;  Surgeon: Nancy Marus, MD;  Location: [facility];  Service: Gynecology;  Laterality: Bilateral;  . TONSILLECTOMY    . TRANSTHORACIC ECHOCARDIOGRAM  04/2007   EF>55%; mild MR; mild-mod TR; mild pulm HTN; mild calcification of aortiv valve leaflets with mild valvular aortic stenosis  . TUBAL LIGATION       Current Meds  Medication Sig  . acetaminophen (TYLENOL) 500 MG tablet   . apixaban (ELIQUIS) 5 MG TABS tablet Take 1 tablet (5 mg total) by mouth 2 (two) times daily.  . clobetasol ointment  (TEMOVATE) 9.76 % Apply 1 application topically 3 (three) times a week.  . diltiazem (TIAZAC) 300 MG 24 hr capsule TAKE 1 CAPSULE(300 MG) BY MOUTH DAILY (Patient taking differently: Take 300 mg by mouth daily.)  . levothyroxine (SYNTHROID, LEVOTHROID) 50 MCG tablet Take 1 tablet (50 mcg total) by mouth daily before breakfast.  . lidocaine (XYLOCAINE) 5 % ointment Apply 1 application topically as needed. Up to TID for treatment of burning symptoms of the vulva  . lidocaine-prilocaine (EMLA) cream Apply a dime size to port-a-cath 1-2 hours prior to access. Cover with Saran wrap.  . polyethylene glycol (MIRALAX / GLYCOLAX) 17 g packet Take 17 g by mouth daily as needed for severe constipation.  . sennosides-docusate sodium (SENOKOT-S) 8.6-50 MG tablet 1 tablet in the evening as needed  . traMADol (ULTRAM) 50 MG tablet Take two tablets (100 mg) by mouth twice daily.  . valACYclovir (VALTREX) 500 MG tablet Take 1 tablet (500 mg total) by mouth daily.     Allergies:   Statins and Ciprofloxacin   Social  History   Tobacco Use  . Smoking status: Never Smoker  . Smokeless tobacco: Never Used  Vaping Use  . Vaping Use: Never used  Substance Use Topics  . Alcohol use: No  . Drug use: No     Family Hx: The patient's family history includes Heart attack in her brother; Heart disease in her brother and mother; Hypertension in her mother and sister; Kidney disease in her brother; Stroke in her father and sister.  ROS:   Please see the history of present illness.     All other systems reviewed and are negative.   Prior CV studies:   The following studies were reviewed today:  Echo 03/27/2020 1. Left ventricular ejection fraction, by estimation, is 60 to 65%. The  left ventricle has normal function. The left ventricle has no regional  wall motion abnormalities. There is mild left ventricular hypertrophy.  Left ventricular diastolic parameters  are consistent with Grade II diastolic  dysfunction (pseudonormalization).  2. Right ventricular systolic function is normal. The right ventricular  size is normal. There is mildly elevated pulmonary artery systolic  pressure. The estimated right ventricular systolic pressure is 33.8 mmHg.  3. Left atrial size was moderately dilated.  4. The mitral valve is normal in structure. Mild mitral valve  regurgitation. No evidence of mitral stenosis. Moderate to severe mitral  annular calcification.  5. The aortic valve is tricuspid. Aortic valve regurgitation is not  visualized. Moderate aortic valve stenosis. Aortic valve area, by VTI  measures 1.40 cm. Aortic valve mean gradient measures 19.0 mmHg.  6. The inferior vena cava is normal in size with greater than 50%  respiratory variability, suggesting right atrial pressure of 3 mmHg.   Labs/Other Tests and Data Reviewed:    EKG:  An ECG dated 03/26/2020 was personally reviewed today and demonstrated:  Sinus rhythm with nonspecific ST changes.  Recent Labs: 03/29/2020: Magnesium 1.9 05/20/2020: ALT 7; BUN 13; Creatinine 0.77; Hemoglobin 11.9; Platelet Count 260; Potassium 3.7; Sodium 138; TSH 2.600   Recent Lipid Panel Lab Results  Component Value Date/Time   CHOL 431 (H) 03/28/2020 08:39 AM   CHOL 333 (H) 08/02/2016 03:19 PM   TRIG 75 03/28/2020 08:39 AM   TRIG 87 08/02/2016 03:19 PM   HDL 58 03/28/2020 08:39 AM   HDL 56 08/02/2016 03:19 PM   CHOLHDL 7.4 03/28/2020 08:39 AM   LDLCALC 358 (H) 03/28/2020 08:39 AM   LDLCALC 257 (H) 08/02/2016 03:19 PM    Wt Readings from Last 3 Encounters:  05/21/20 140 lb (63.5 kg)  04/22/20 140 lb (63.5 kg)  03/26/20 145 lb 8.1 oz (66 kg)     Risk Assessment/Calculations:     CHA2DS2-VASc Score = 7  This indicates a 11.2% annual risk of stroke. The patient's score is based upon: CHF History: No HTN History: Yes Diabetes History: No Stroke History: Yes Vascular Disease History: Yes Age Score: 2 Gender Score: 1      Objective:    Vital Signs:  There were no vitals taken for this visit.   VITAL SIGNS:  reviewed  ASSESSMENT & PLAN:    1. PAF: Patient has a remote history of PAF, with recent CVA, she was placed on Eliquis.  EKG in the hospital does show sinus rhythm.  Heart rate quite regular on exam  2. History of CVA: Occurred recently, placed on Eliquis due to prior history of PAF.  3. CAD: No chest pain.  Not on aspirin given the need for Eliquis  4. Hypertension: Continue current therapy  5. Hyperlipidemia: Intolerant of multiple statins, she is interested to start [place], I will refer her to the lipid clinic.  6. Bilateral carotid artery disease: Recent CTA of the neck showed less than 50% disease.  7. Hypothyroidism: On levothyroxine.        COVID-19 Education: The signs and symptoms of COVID-19 were discussed with the patient and how to seek care for testing (follow up with PCP or arrange E-visit).  The importance of social distancing was discussed today.  Time:   Today, I have spent 10 minutes with the patient with telehealth technology discussing the above problems.     Medication Adjustments/Labs and Tests Ordered: Current medicines are reviewed at length with the patient today.  Concerns regarding medicines are outlined above.   Tests Ordered: No orders of the defined types were placed in this encounter.   Medication Changes: No orders of the defined types were placed in this encounter.   Follow Up:  In Person in 3 month(s)  Signed, Almyra Deforest, [place]  06/04/2020 10:54 PM    [facility]

## 2020-06-02 NOTE — Telephone Encounter (Signed)
  Patient Consent for Virtual Visit         Shannon Scott has provided verbal consent on 06/02/2020 for a virtual visit (video or telephone).   CONSENT FOR VIRTUAL VISIT FOR:  Shannon Scott  By participating in this virtual visit I agree to the following:  I hereby voluntarily request, consent and authorize [facility] and its employed or contracted physicians, physician assistants, nurse practitioners or other licensed health care professionals (the Practitioner), to provide me with telemedicine health care services (the "Services") as deemed necessary by the treating Practitioner. I acknowledge and consent to receive the Services by the Practitioner via telemedicine. I understand that the telemedicine visit will involve communicating with the Practitioner through live audiovisual communication technology and the disclosure of certain medical information by electronic transmission. I acknowledge that I have been given the opportunity to request an in-person assessment or other available alternative prior to the telemedicine visit and am voluntarily participating in the telemedicine visit.  I understand that I have the right to withhold or withdraw my consent to the use of telemedicine in the course of my care at any time, without affecting my right to future care or treatment, and that the Practitioner or I may terminate the telemedicine visit at any time. I understand that I have the right to inspect all information obtained and/or recorded in the course of the telemedicine visit and may receive copies of available information for a reasonable fee.  I understand that some of the potential risks of receiving the Services via telemedicine include:  Marland Kitchen Delay or interruption in medical evaluation due to technological equipment failure or disruption; . Information transmitted may not be sufficient (e.g. poor resolution of images) to allow for appropriate medical decision making by the  Practitioner; and/or  . In rare instances, security protocols could fail, causing a breach of personal health information.  Furthermore, I acknowledge that it is my responsibility to provide information about my medical history, conditions and care that is complete and accurate to the best of my ability. I acknowledge that Practitioner's advice, recommendations, and/or decision may be based on factors not within their control, such as incomplete or inaccurate data provided by me or distortions of diagnostic images or specimens that may result from electronic transmissions. I understand that the practice of medicine is not an exact science and that Practitioner makes no warranties or guarantees regarding treatment outcomes. I acknowledge that a copy of this consent can be made available to me via my patient portal ([facility]), or I can request a printed copy by calling the office of [facility].    I understand that my insurance will be billed for this visit.   I have read or had this consent read to me. . I understand the contents of this consent, which adequately explains the benefits and risks of the Services being provided via telemedicine.  . I have been provided ample opportunity to ask questions regarding this consent and the Services and have had my questions answered to my satisfaction. . I give my informed consent for the services to be provided through the use of telemedicine in my medical care

## 2020-06-03 ENCOUNTER — Ambulatory Visit (HOSPITAL_BASED_OUTPATIENT_CLINIC_OR_DEPARTMENT_OTHER)
Admission: RE | Admit: 2020-06-03 | Discharge: 2020-06-03 | Disposition: A | Discharge status: Home / Self Care | Payer: Medicare Other | Source: Ambulatory Visit | Attending: Hematology & Oncology | Admitting: Hematology & Oncology

## 2020-06-03 ENCOUNTER — Encounter (HOSPITAL_BASED_OUTPATIENT_CLINIC_OR_DEPARTMENT_OTHER): Payer: Self-pay

## 2020-06-03 ENCOUNTER — Other Ambulatory Visit: Payer: Self-pay

## 2020-06-03 DIAGNOSIS — C539 Malignant neoplasm of cervix uteri, unspecified: Secondary | ICD-10-CM

## 2020-06-03 DIAGNOSIS — C799 Secondary malignant neoplasm of unspecified site: Secondary | ICD-10-CM | POA: Insufficient documentation

## 2020-06-03 MED ORDER — IOHEXOL 300 MG/ML  SOLN
100.0000 mL | Freq: Once | INTRAMUSCULAR | Status: AC | PRN
  Administered 2020-06-03: 100 mL via INTRAVENOUS

## 2020-06-04 ENCOUNTER — Encounter: Payer: Self-pay | Admitting: *Deleted

## 2020-06-06 ENCOUNTER — Ambulatory Visit: Payer: Medicare Other | Admitting: Gynecologic Oncology

## 2020-06-09 ENCOUNTER — Telehealth: Payer: Self-pay | Admitting: Family

## 2020-06-09 NOTE — Telephone Encounter (Signed)
Called and LMVM for patient regarding appointments for 1/11 being rescheduled per 1/10 sch msg

## 2020-06-10 ENCOUNTER — Inpatient Hospital Stay: Payer: Medicare Other

## 2020-06-10 ENCOUNTER — Inpatient Hospital Stay: Payer: Medicare Other | Admitting: Family

## 2020-06-17 ENCOUNTER — Inpatient Hospital Stay: Payer: Medicare Other

## 2020-06-17 ENCOUNTER — Telehealth: Payer: Self-pay

## 2020-06-17 ENCOUNTER — Telehealth: Payer: Self-pay | Admitting: Internal Medicine

## 2020-06-17 ENCOUNTER — Inpatient Hospital Stay: Payer: Medicare Other | Admitting: Family

## 2020-06-17 DIAGNOSIS — E785 Hyperlipidemia, unspecified: Secondary | ICD-10-CM

## 2020-06-17 NOTE — Telephone Encounter (Signed)
Called pt to r/s todays missed appts tp 1/20,  Unable to leave a vm on mobile, left vm on home phone with new appt     aom

## 2020-06-17 NOTE — Telephone Encounter (Signed)
PA for repatha sureclick submitted via C[facility] Recent LDL = 358 (03/28/20) Carotid stenosis, CVA, FH, aortic stenosis, CABG  MyChart message sent to patient about [employer]  Will update patient when approval is received

## 2020-06-17 NOTE — Telephone Encounter (Signed)
Request Reference Number: [id number]. REPATHA SURE INJ 140MG/ML is approved through 12/15/2020

## 2020-06-17 NOTE — Addendum Note (Signed)
Addended by: Fidel Levy on: 06/17/2020 04:45 PM   Modules accepted: Orders

## 2020-06-17 NOTE — Telephone Encounter (Signed)
lled and lmomed for pharmd lipid appt to schedule

## 2020-06-18 MED ORDER — REPATHA SURECLICK 140 MG/ML ~~LOC~~ SOAJ: 1.0000 | SUBCUTANEOUS | 11 refills | Status: DC

## 2020-06-18 NOTE — Telephone Encounter (Signed)
That's awesome - hope she tolerates it.  Dr Lemmie Evens

## 2020-06-18 NOTE — Addendum Note (Signed)
Addended by: Fidel Levy on: 06/18/2020 08:01 AM   Modules accepted: Orders

## 2020-06-19 ENCOUNTER — Inpatient Hospital Stay: Payer: Medicare Other

## 2020-06-19 ENCOUNTER — Inpatient Hospital Stay: Payer: Medicare Other | Admitting: Family

## 2020-06-19 ENCOUNTER — Ambulatory Visit: Payer: Medicare Other

## 2020-06-19 ENCOUNTER — Inpatient Hospital Stay: Payer: Medicare Other | Attending: Hematology & Oncology

## 2020-06-19 DIAGNOSIS — Z9221 Personal history of antineoplastic chemotherapy: Secondary | ICD-10-CM | POA: Insufficient documentation

## 2020-06-19 DIAGNOSIS — E785 Hyperlipidemia, unspecified: Secondary | ICD-10-CM | POA: Insufficient documentation

## 2020-06-19 DIAGNOSIS — C541 Malignant neoplasm of endometrium: Secondary | ICD-10-CM | POA: Insufficient documentation

## 2020-06-19 DIAGNOSIS — Z90710 Acquired absence of both cervix and uterus: Secondary | ICD-10-CM | POA: Insufficient documentation

## 2020-06-19 DIAGNOSIS — Z7901 Long term (current) use of anticoagulants: Secondary | ICD-10-CM | POA: Insufficient documentation

## 2020-06-19 DIAGNOSIS — Z79899 Other long term (current) drug therapy: Secondary | ICD-10-CM | POA: Insufficient documentation

## 2020-06-19 DIAGNOSIS — Z90722 Acquired absence of ovaries, bilateral: Secondary | ICD-10-CM | POA: Insufficient documentation

## 2020-06-19 DIAGNOSIS — Z951 Presence of aortocoronary bypass graft: Secondary | ICD-10-CM | POA: Insufficient documentation

## 2020-06-19 DIAGNOSIS — Z923 Personal history of irradiation: Secondary | ICD-10-CM | POA: Insufficient documentation

## 2020-06-19 DIAGNOSIS — I4891 Unspecified atrial fibrillation: Secondary | ICD-10-CM | POA: Insufficient documentation

## 2020-06-19 DIAGNOSIS — I2510 Atherosclerotic heart disease of native coronary artery without angina pectoris: Secondary | ICD-10-CM | POA: Insufficient documentation

## 2020-06-19 DIAGNOSIS — Z8673 Personal history of transient ischemic attack (TIA), and cerebral infarction without residual deficits: Secondary | ICD-10-CM | POA: Insufficient documentation

## 2020-06-25 ENCOUNTER — Telehealth: Payer: Self-pay

## 2020-06-25 NOTE — Telephone Encounter (Signed)
Called and left a vm per inbasket message to r/s her missed 06/20/20 appts    Shannon Scott

## 2020-06-25 NOTE — Telephone Encounter (Signed)
[place], Holli Humbles, NP  Peggyann Juba, Kathrynn Running I talked to dr. Marin Olp about this and he said she should not stop treatment. He is ok with her going to treatment every 3 months with lab and follow-up but to stop treatment she will cause the cancer to progress.   Judson Roch    As noted above, I called the pt and she still equest not to come in and have visit and tx for a few months, appt made for 09/05/20    [facility]

## 2020-06-27 ENCOUNTER — Encounter: Payer: Self-pay | Admitting: Gynecologic Oncology

## 2020-06-27 ENCOUNTER — Other Ambulatory Visit: Payer: Self-pay | Admitting: Hematology & Oncology

## 2020-06-27 DIAGNOSIS — M545 Low back pain, unspecified: Secondary | ICD-10-CM

## 2020-06-27 DIAGNOSIS — G8929 Other chronic pain: Secondary | ICD-10-CM

## 2020-06-27 DIAGNOSIS — M792 Neuralgia and neuritis, unspecified: Secondary | ICD-10-CM

## 2020-06-27 DIAGNOSIS — C541 Malignant neoplasm of endometrium: Secondary | ICD-10-CM

## 2020-06-30 ENCOUNTER — Other Ambulatory Visit: Payer: Self-pay

## 2020-06-30 ENCOUNTER — Encounter: Payer: Self-pay | Admitting: Gynecologic Oncology

## 2020-06-30 ENCOUNTER — Inpatient Hospital Stay (HOSPITAL_BASED_OUTPATIENT_CLINIC_OR_DEPARTMENT_OTHER): Payer: Medicare Other | Admitting: Gynecologic Oncology

## 2020-06-30 VITALS — BP 161/79 | HR 85 | Temp 97.9°F | Resp 18 | Wt 134.5 lb

## 2020-06-30 DIAGNOSIS — Z923 Personal history of irradiation: Secondary | ICD-10-CM | POA: Diagnosis not present

## 2020-06-30 DIAGNOSIS — Z79899 Other long term (current) drug therapy: Secondary | ICD-10-CM | POA: Diagnosis not present

## 2020-06-30 DIAGNOSIS — Z8673 Personal history of transient ischemic attack (TIA), and cerebral infarction without residual deficits: Secondary | ICD-10-CM | POA: Diagnosis not present

## 2020-06-30 DIAGNOSIS — C541 Malignant neoplasm of endometrium: Secondary | ICD-10-CM

## 2020-06-30 DIAGNOSIS — E785 Hyperlipidemia, unspecified: Secondary | ICD-10-CM | POA: Diagnosis not present

## 2020-06-30 DIAGNOSIS — Z90722 Acquired absence of ovaries, bilateral: Secondary | ICD-10-CM | POA: Diagnosis not present

## 2020-06-30 DIAGNOSIS — Z9221 Personal history of antineoplastic chemotherapy: Secondary | ICD-10-CM | POA: Diagnosis not present

## 2020-06-30 DIAGNOSIS — Z90710 Acquired absence of both cervix and uterus: Secondary | ICD-10-CM | POA: Diagnosis not present

## 2020-06-30 DIAGNOSIS — N9089 Other specified noninflammatory disorders of vulva and perineum: Secondary | ICD-10-CM

## 2020-06-30 DIAGNOSIS — I4891 Unspecified atrial fibrillation: Secondary | ICD-10-CM | POA: Diagnosis not present

## 2020-06-30 DIAGNOSIS — Z7901 Long term (current) use of anticoagulants: Secondary | ICD-10-CM | POA: Diagnosis not present

## 2020-06-30 DIAGNOSIS — Z951 Presence of aortocoronary bypass graft: Secondary | ICD-10-CM | POA: Diagnosis not present

## 2020-06-30 DIAGNOSIS — I2510 Atherosclerotic heart disease of native coronary artery without angina pectoris: Secondary | ICD-10-CM | POA: Diagnosis not present

## 2020-06-30 MED ORDER — ESTROGENS, CONJUGATED 0.625 MG/GM VA CREA: 1.0000 | TOPICAL_CREAM | VAGINAL | 1 refills | Status: DC

## 2020-06-30 NOTE — Progress Notes (Signed)
Gynecologic Oncology Return Clinic Visit  06/30/20  Reason for Visit: follow-up vulvar symptoms  Treatment History: Oncology History Overview Note  The patient began noticing postmenopausal bleeding in June, 2017. She saw her primary care physician who determined there was no blood in the urine, and she was then referred to Dr Benjie Karvonen who, on 11/21/15 performed a TVUS which showed a uterus measuring 6x2.7x3.3cm with normal ovaries. There was a thickened endometrium of 6.20m.    [place] ([place]  11/21/2015 Initial Diagnosis   [place] A pipelle endometrial biopsy was performed on 11/21/15 which showed FIGO grade 1-2 endometrial cancer.  The pap from the same date was normal.   12/30/2015 Surgery   Robotic assisted total hysterectomy, BSO, pelvic and PA SLN biopsy. 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  Postoperative Diagnosis: same.  Surgery: Total robotic hysterectomy bilateral salpingo-oophorectomy, left pelvic, right para-aortic and bifurcation SLN removal  Operative findings:  1) Colon adherent to posterior uterus, left sidewall and appendix 2) 2 right para-aortic nodes for SLN (high and PA node) 3) Aortic bifurcation nodes 4) Left obturator node  Diagnosis 1. Lymph node, sentinel, biopsy, right obturator - ONE BENIGN LYMPH NODE (0/1). 2. Lymph node, sentinel, biopsy, right peri-aortic - ONE BENIGN LYMPH NODE (0/1). 3. Lymph node, sentinel, biopsy, right lower peri-aortic - ONE BENIGN LYMPH NODE (0/1). 4. Lymph node, sentinel, biopsy, aortic bifurcation - ONE BENIGN LYMPH NODE (0/1). 5. Lymph node, sentinel, biopsy, left obturator - ONE BENIGN LYMPH NODE (0/1). 6. Uterus +/- tubes/ovaries, neoplastic - ENDOMETRIAL ADENOCARCINOMA, 1.7 CM WITH SUPERFICIAL MYOMETRIAL INVASION. - MARGINS NOT INVOLVED. - CERVIX, BILATERAL OVARIES AND BILATERAL FALLOPIAN TUBES FREE OF TUMOR. Microscopic Comment 6. ONCOLOGY TABLE-UTERUS,  CARCINOMA OR CARCINOSARCOMA Specimen: Uterus with bilateral fallopian tubes and ovaries and sentinel lymph node biopsies. Procedure: Hysterectomy with sentinel lymph nodes. Lymph node sampling performed: Yes. Specimen integrity: Intact. Maximum tumor size: 1.7 cm Histologic type: Endometrioid with squamous differentiation. Grade: 2 Myometrial invasion: 0.3 cm where myometrium is 1 cm in thickness Cervical stromal involvement: No. Extent of involvement of other organs: None, identified. Lymph - vascular invasion: Not identified.    05/13/2016 Imaging   05/03/16 CT guided drainage: IMPRESSION: Successful CT guided aspiration of approximately 15 cc of serous, slightly cloudy / milky fluid from the residual collection within the left hemipelvis. All aspirated samples were sent to the laboratory for cytologic and gram stain analysis.  Cytology was negative   07/2017 Progression   Shannon CULLENin February 2019 presented to her primary physician with complaints of rectal bleeding.  She was scheduled for a colonoscopy and had taken the bowel prep when she presented with obstruction of her distal sigmoid colon.   08/18/2017 Imaging   maging 08/18/2017 The left ureter is dilated to the left hemipelvis. It becomes narrowed in the inflammatory pelvic mass. This is described below. Bladder is within normal limits.   Stomach/Bowel: Large hiatal hernia is unchanged. Stable prominent gastric folds within the hiatal hernia.   The colon is diffusely distended. Distal small bowel is distended. A transition point between dilated large bowel and decompressed large bowel occurs in the sigmoid colon. There is a heterogeneous ill-defined mass involving the sigmoid colon and left side of the pelvis which includes the colon wall, adjacent fat, and both fluid and gas elements. Findings are suspicious for a focal perforation with abscess formation. Underlying malignancy is a strong consideration given the  appearance and colon obstruction. The mass also involves the  left ureter causing an element of left ureteral dilatation worrisome for an element of left ureteral obstruction. The gas and fluid collection is very small measuring 1.8 x 1.2 cm on  image 109 of series 4. Overall mass size including the involved colon is 5.7 x 5.3 cm.    08/21/2017 Surgery   [name] underwent a diverting laparoscopic transverse loop colostomy and subsequent placement of a left ureteral stent.   08/2017 -  Radiation Therapy   She then received pelvic external beam radiation therapy    10/13/2017 - 11/17/2017 Chemotherapy   The patient had dexamethasone (DECADRON) 4 MG tablet, 8 mg, Oral, Daily, 1 of 1 cycle, Start date: --, End date: -- palonosetron (ALOXI) injection 0.25 mg, 0.25 mg, Intravenous,  Once, 6 of 6 cycles Administration: 0.25 mg (10/13/2017), 0.25 mg (10/20/2017), 0.25 mg (11/03/2017), 0.25 mg (11/10/2017), 0.25 mg (11/17/2017) CARBOplatin (PARAPLATIN) 150 mg in sodium chloride 0.9 % 100 mL chemo infusion, 150 mg (100 % of original dose 148 mg), Intravenous,  Once, 6 of 6 cycles Dose modification:   (original dose 148 mg, Cycle 1) Administration: 150 mg (10/13/2017), 150 mg (10/20/2017), 150 mg (11/03/2017), 150 mg (11/10/2017), 150 mg (11/17/2017) PACLitaxel (TAXOL) 132 mg in sodium chloride 0.9 % 250 mL chemo infusion (</= 60m/m2), 80 mg/m2 = 132 mg, Intravenous,  Once, 6 of 6 cycles Dose modification: 45 mg/m2 (original dose 80 mg/m2, Cycle 2, Reason: Other (see comments), Comment: Decreasing dose to 45 mg/m2 while undergoing XRT) Administration: 132 mg (10/13/2017), 72 mg (10/20/2017), 72 mg (11/03/2017), 72 mg (11/10/2017), 72 mg (11/17/2017)  for chemotherapy treatment.    01/18/2018 Imaging   notable for the presence of an IVC filter and 11 mm left internal iliac node that was previously 1.6 cm and a soft tissue mass in the left pelvic sidewall and now measuring 3.9 x 2.5 cm.   03/03/2018 Imaging   PET   demonstrates a hypermetabolic left pelvic sidewall mass, greater than 3 weeks after completion of radiotherapy.   03/16/2018 -  Chemotherapy    Patient is on Treatment Plan: COLORECTAL PEMBROLIZUMAB Q21D      04/2018 Miscellaneous    CA 125  Ref. Range 05/04/2018 12:08 05/25/2018 11:21 06/15/2018 13:30 10/10/2018 12:20 01/23/2019 11:35  Cancer Antigen (CA) 125 Latest Ref Range: 0.0 - 38.1 U/mL 11.2 10.9 10.2 10.9 9.2     10/05/2018 Imaging   PET  3. Continued positive response to therapy within the left pelvic sidewall mass, which demonstrates residual low level hypermetabolism that has mildly decreased. 4. Resolved mediastinal nodal hypermetabolism. Nearly resolved bilateral hilar nodal hypermetabolism. These findings could represent resolving reactive nodes versus response to therapy within metastatic nodes. 5. No metabolic evidence of new sites of metastatic disease.   09/27/2019 Imaging   CT C/A/P: IMPRESSION: 1. Since the PET of 10/05/2018, similar to slight decrease in left pelvic sidewall soft tissue fullness, without well-defined mass. 2. No findings of  new or progressive disease. 3. Chronic or recurrent left renal pelvic and proximal ureteric wall thickening/mucosal hyperenhancement. Nonspecific, especially in the setting of a prior stent. Cannot exclude ascending infection. Similarly, improved bladder wall and surrounding edema which could simply be treatment related. Correlate with urinalysis to exclude cystitis. 4. Small hiatal hernia. 5. Transverse colostomy with similar small bowel containing parastomal hernia. 6.  Aortic Atherosclerosis (ICD10-I70.0).   03/17/2020 Imaging   CT C/A/P: 1. No evidence of cervical cancer local recurrence or metastatic disease. 2. LEFT midline colostomy with peristomal hernia. Long  segment small bowel enters the hernia sac without obstruction. 3. Chronic fractures sacral insufficiency fractures     Interval History: Since her  last visit with me, she notes having some improvement of her vulvar symptoms.  She initially just use the steroid cream several times, but then started using it again about 2-3 weeks ago and has been using it 3 times a week at night.  She thinks her bottom looks a little bit less red.  She continues to have some tenderness especially on the left labia.  She uses a water bottle after urinating.  She denies any vaginal bleeding or discharge.  She continues to have good bowel function then uses MiraLAX most evenings.  She has some increased frequency which is unchanged, that she attributes partly to drinking a lot of water.  She also feels that she may not empty completely.  Last dose of Pembro was 01/2020.  She is taking a chemo holiday and will see Dr. Marin Olp in April to discuss whether or not to restart Pembro.  Thinks that her left leg is significantly less swollen since her pause in systemic treatment.  Continues to have some symptoms related to shingles outbreak on her left thigh, mostly pruritus.  Past Medical/Surgical History: Past Medical History:  Diagnosis Date  . A-fib ([place]) 03/26/2020  . Acute CVA (cerebrovascular accident) ([place]) 03/26/2020  . Arthritis   . Asthma    allergy induced  . Bilateral carotid artery disease ([facility])    L carotid bruit  . Bursitis    left hip  . CAD (coronary artery disease)   . Cancer ([place])   . Dyslipidemia    intolerant to statins, welchol, niacin, zetia  . Goals of care, counseling/discussion 10/11/2017  . History of blood transfusion   . History of nuclear stress test 04/24/2012   lexiscan; normal study  . Hypertension   . Hypothyroidism   . 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 ([place]) 10/11/2017  . Postoperative nausea and vomiting 01/02/2016    Past Surgical History:  Procedure Laterality Date  . ABDOMINAL HYSTERECTOMY    . Carotid Doppler  02/2012   40-59% right int carotid artery stenosis; 60-79% L int  carotid stenosis; L carotid bruit  . CORONARY ARTERY BYPASS GRAFT  03/12/2004   LIMA to LAD, SVG to circumflex, SVG to PDA  . CYSTOSCOPY W/ URETERAL STENT PLACEMENT Left 12/06/2017   Procedure: CYSTOSCOPY WITH LEFT URETERAL STENT EXCHANGE;  Surgeon: Irine Seal, MD;  Location: [facility];  Service: Urology;  Laterality: Left;  . CYSTOSCOPY WITH STENT PLACEMENT Bilateral 08/21/2017   Procedure: CYSTOSCOPY WITH STENT PLACEMENT;  Surgeon: Ceasar Mons, MD;  Location: [facility];  Service: Urology;  Laterality: Bilateral;  . IR FLUORO GUIDE PORT INSERTION RIGHT  10/11/2017  . IR GENERIC HISTORICAL  04/29/2016   IR RADIOLOGIST EVAL & MGMT 04/29/2016 Sandi Mariscal, MD [facility]  . IR GENERIC HISTORICAL  05/12/2016   IR RADIOLOGIST EVAL & MGMT 05/12/2016 Sandi Mariscal, MD [facility]  . IR IVC FILTER PLMT / S&I /IMG GUID/MOD SED  10/11/2017  . IR US GUIDE VASC ACCESS RIGHT  10/11/2017  . ROBOTIC ASSISTED TOTAL HYSTERECTOMY WITH BILATERAL SALPINGO OOPHERECTOMY Bilateral 12/30/2015   Procedure: XI ROBOTIC ASSISTED TOTAL HYSTERECTOMY WITH BILATERAL SALPINGO OOPHORECTOMY WITH SENTAL LYMPH NODE BIOPSY;  Surgeon: Nancy Marus, MD;  Location: [facility];  Service: Gynecology;  Laterality: Bilateral;  . TONSILLECTOMY    . TRANSTHORACIC ECHOCARDIOGRAM  04/2007  EF>55%; mild MR; mild-mod TR; mild pulm HTN; mild calcification of aortiv valve leaflets with mild valvular aortic stenosis  . TUBAL LIGATION      Family History  Problem Relation Age of Onset  . Hypertension Mother   . Heart disease Mother        Died in her 27s  . Stroke Father   . Kidney disease Brother   . Heart disease Brother        also HTN, hyperlipidemia  . Heart attack Brother   . Stroke Sister        x2  . Hypertension Sister     Social History   Socioeconomic History  . Marital status: Widowed    Spouse name: Not on file  . Number of children: 2  . Years of education: Not on file  . Highest education level: Not on  file  Occupational History    Employer: RETIRED  Tobacco Use  . Smoking status: Never Smoker  . Smokeless tobacco: Never Used  Vaping Use  . Vaping Use: Never used  Substance and Sexual Activity  . Alcohol use: No  . Drug use: No  . Sexual activity: Not Currently  Other Topics Concern  . Not on file  Social History Narrative  . Not on file   Social Determinants of Health   Financial Resource Strain: Not on file  Food Insecurity: Not on file  Transportation Needs: Not on file  Physical Activity: Not on file  Stress: Not on file  Social Connections: Not on file    Current Medications:  Current Outpatient Medications:  .  acetaminophen (TYLENOL) 500 MG tablet, , Disp: , Rfl:  .  apixaban (ELIQUIS) 5 MG TABS tablet, Take 1 tablet (5 mg total) by mouth 2 (two) times daily., Disp: 60 tablet, Rfl: 0 .  clobetasol ointment (TEMOVATE) 9.21 %, Apply 1 application topically 3 (three) times a week., Disp: 30 g, Rfl: 0 .  conjugated estrogens (PREMARIN) vaginal cream, Place 1 Applicatorful vaginally 3 (three) times a week. Apply nightly for 2 weeks, then 3 times a week., Disp: 42.5 g, Rfl: 1 .  diltiazem (TIAZAC) 300 MG 24 hr capsule, TAKE 1 CAPSULE(300 MG) BY MOUTH DAILY (Patient taking differently: Take 300 mg by mouth daily.), Disp: 30 capsule, Rfl: 1 .  Evolocumab (REPATHA SURECLICK) 194 MG/ML SOAJ, Inject 1 Dose into the skin every 14 (fourteen) days., Disp: 2 mL, Rfl: 11 .  levothyroxine (SYNTHROID, LEVOTHROID) 50 MCG tablet, Take 1 tablet (50 mcg total) by mouth daily before breakfast., Disp: 30 tablet, Rfl: 1 .  lidocaine (XYLOCAINE) 5 % ointment, Apply 1 application topically as needed. Up to TID for treatment of burning symptoms of the vulva, Disp: 35.44 g, Rfl: 0 .  lidocaine-prilocaine (EMLA) cream, Apply a dime size to port-a-cath 1-2 hours prior to access. Cover with Saran wrap., Disp: 30 g, Rfl: 2 .  polyethylene glycol (MIRALAX / GLYCOLAX) 17 g packet, Take 17 g by mouth  daily as needed for severe constipation., Disp: 14 each, Rfl: 0 .  traMADol (ULTRAM) 50 MG tablet, TAKE 2 TABLETS(100 MG) BY MOUTH TWICE DAILY, Disp: 120 tablet, Rfl: 0 No current facility-administered medications for this visit.  Facility-Administered Medications Ordered in Other Visits:  .  sodium chloride flush (NS) 0.9 % injection 10 mL, 10 mL, Intracatheter, PRN, Volanda Napoleon, MD, 10 mL at 02/05/20 1440  Review of Systems: Pertinent positives include leg swelling, nausea, joint pain, back pain, urinary frequency, bruising and bleeding  easily. Denies appetite changes, fevers, chills, fatigue, unexplained weight changes. Denies hearing loss, neck lumps or masses, mouth sores, ringing in ears or voice changes. Denies cough or wheezing.  Denies shortness of breath. Denies chest pain or palpitations.  Denies abdominal distention, pain, blood in stools, constipation, diarrhea, vomiting, or early satiety. Denies pain with intercourse, dysuria, hematuria or incontinence. Denies hot flashes, pelvic pain, vaginal bleeding or vaginal discharge.   Denies muscle pain/cramps. Denies itching, rash, or wounds. Denies dizziness, headaches, numbness or seizures. Denies swollen lymph nodes or glands. Denies anxiety, depression, confusion, or decreased concentration.  Physical Exam: BP (!) 161/79 (BP Location: Left Arm, Patient Position: Sitting)   Pulse 85   Temp 97.9 F (36.6 C) (Tympanic)   Resp 18   Wt 134 lb 8 oz (61 kg)   SpO2 98%   BMI 25.41 kg/m  General: Alert, oriented, no acute distress. HEENT: Atraumatic, normocephalic, sclera anicteric. Chest: Unlabored breathing on room air, lungs clear to auscultation bilaterally.  No wheezing. Extremities: Grossly normal range of motion.  Warm, well perfused.  1+ edema, more on the left than right. Lymphatics: No cervical, supraclavicular, or inguinal adenopathy. GU: External genitalia notable for some mild to moderate erythema of bilateral  labia.  Additionally the patient has a 2-3 mm area of leukoplakia at the superior aspect of the labia minora.  There is thinning of the skin at the posterior aspect of bilateral labia, more on the left.  No discrete lesions noted.  Patient is quite tender with palpation over this area.  Laboratory & Radiologic Studies: CT C/A/P on 1/4: IMPRESSION: 1. Status post hysterectomy and bilateral oophorectomy. No findings for recurrent tumor, regional lymphadenopathy or metastatic disease. 2. Stable loop colostomy with sizable parastomal hernia containing small bowel and colon. No obstructive findings. 3. Stable healed sacral insufficiency fractures and remote partially united fracture involving the right pubic symphysis. 4. Advanced atherosclerotic calcifications involving the thoracic and abdominal aorta and branch vessels including the coronary arteries. 5. Aortic atherosclerosis.  Vulvar biopsy 03/13/20 of the left labia: - Squamous epithelium with interface and epidermal mild acute inflammatory infiltrate. - Superficial and deep dermis with moderate lymphocytic inflammatory infiltrate. - No dysplasia or malignancy identified.  Assessment & Plan: Shannon Scott is a 80 y.o. woman with recurrent endometrial cancer,currently managed on Pembro,who presents for follow-up of vulvar symptoms.  The patient appears to have had some improvement since I saw her last although I think has had some thinning of the skin secondary to clobetasol.  Vulvar biopsy from October showed inflammation although I suspect that the patient also has lichen sclerosis giving vulvar findings.  I have asked her to stop using the steroid cream and we will try vaginal estrogen.  Discussed vulvar measures to prevent symptoms such as using the squirt bottle after she urinates.  Patient voiced understanding.  I will see her back for a visit in approximately 8 weeks.  28 minutes of total time was spent for this patient  encounter, including preparation, face-to-face counseling with the patient and coordination of care, and documentation of the encounter.  Jeral Pinch, MD  Division of Gynecologic Oncology  Department of Obstetrics and Gynecology  [facility]

## 2020-06-30 NOTE — Patient Instructions (Signed)
Please stop using the steroid cream. Instead, I sent a prescription for vaginal estrogen cream.  I had like you to use this nightly for 2 weeks and then 3 times a week at night.  Continue all the other things that you have been doing for the vulvar irritation including the wash bottle after you urinate.  I will see you in 2 months and we will reassess your symptoms and repeat an exam.

## 2020-08-15 ENCOUNTER — Telehealth: Payer: Self-pay | Admitting: Family

## 2020-08-15 NOTE — Telephone Encounter (Signed)
Due to provider being on [facility] 4/8.  Appointments were rescheduled & updated calendar/letter was mailed

## 2020-08-22 ENCOUNTER — Other Ambulatory Visit: Payer: Self-pay | Admitting: Hematology & Oncology

## 2020-08-22 DIAGNOSIS — G8929 Other chronic pain: Secondary | ICD-10-CM

## 2020-08-22 DIAGNOSIS — C541 Malignant neoplasm of endometrium: Secondary | ICD-10-CM

## 2020-08-22 DIAGNOSIS — M545 Low back pain, unspecified: Secondary | ICD-10-CM

## 2020-08-22 DIAGNOSIS — M792 Neuralgia and neuritis, unspecified: Secondary | ICD-10-CM

## 2020-08-25 ENCOUNTER — Encounter: Payer: Self-pay | Admitting: Gynecologic Oncology

## 2020-08-25 ENCOUNTER — Other Ambulatory Visit: Payer: Self-pay

## 2020-08-25 ENCOUNTER — Inpatient Hospital Stay: Payer: Medicare Other | Attending: Hematology & Oncology | Admitting: Gynecologic Oncology

## 2020-08-25 VITALS — BP 159/59 | HR 76 | Temp 98.7°F | Resp 18 | Ht 61.0 in | Wt 145.2 lb

## 2020-08-25 DIAGNOSIS — I4891 Unspecified atrial fibrillation: Secondary | ICD-10-CM | POA: Diagnosis not present

## 2020-08-25 DIAGNOSIS — Z90722 Acquired absence of ovaries, bilateral: Secondary | ICD-10-CM | POA: Diagnosis not present

## 2020-08-25 DIAGNOSIS — I2510 Atherosclerotic heart disease of native coronary artery without angina pectoris: Secondary | ICD-10-CM | POA: Diagnosis not present

## 2020-08-25 DIAGNOSIS — R600 Localized edema: Secondary | ICD-10-CM | POA: Diagnosis not present

## 2020-08-25 DIAGNOSIS — Z7901 Long term (current) use of anticoagulants: Secondary | ICD-10-CM | POA: Insufficient documentation

## 2020-08-25 DIAGNOSIS — E039 Hypothyroidism, unspecified: Secondary | ICD-10-CM | POA: Insufficient documentation

## 2020-08-25 DIAGNOSIS — Z8673 Personal history of transient ischemic attack (TIA), and cerebral infarction without residual deficits: Secondary | ICD-10-CM | POA: Insufficient documentation

## 2020-08-25 DIAGNOSIS — Z90710 Acquired absence of both cervix and uterus: Secondary | ICD-10-CM | POA: Diagnosis not present

## 2020-08-25 DIAGNOSIS — Z9221 Personal history of antineoplastic chemotherapy: Secondary | ICD-10-CM | POA: Diagnosis not present

## 2020-08-25 DIAGNOSIS — C541 Malignant neoplasm of endometrium: Secondary | ICD-10-CM

## 2020-08-25 DIAGNOSIS — Z79899 Other long term (current) drug therapy: Secondary | ICD-10-CM | POA: Insufficient documentation

## 2020-08-25 DIAGNOSIS — E785 Hyperlipidemia, unspecified: Secondary | ICD-10-CM | POA: Insufficient documentation

## 2020-08-25 DIAGNOSIS — Z923 Personal history of irradiation: Secondary | ICD-10-CM | POA: Diagnosis not present

## 2020-08-25 DIAGNOSIS — I10 Essential (primary) hypertension: Secondary | ICD-10-CM | POA: Diagnosis not present

## 2020-08-25 NOTE — Progress Notes (Signed)
Gynecologic Oncology Return Clinic Visit  08/25/2020  Reason for Visit: Follow-up vulvar symptoms in the setting of endometrial cancer  Treatment History: Oncology History Overview Note  The patient began noticing postmenopausal bleeding in June, 2017. She saw her primary care physician who determined there was no blood in the urine, and she was then referred to Dr Benjie Karvonen who, on 11/21/15 performed a TVUS which showed a uterus measuring 6x2.7x3.3cm with normal ovaries. There was a thickened endometrium of 6.44m.    Endometrial cancer ([place]  11/21/2015 Initial Diagnosis   Endometrial cancer ([place] A pipelle endometrial biopsy was performed on 11/21/15 which showed FIGO grade 1-2 endometrial cancer.  The pap from the same date was normal.   12/30/2015 Surgery   Robotic assisted total hysterectomy, BSO, pelvic and PA SLN biopsy. 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  Postoperative Diagnosis: same.  Surgery: Total robotic hysterectomy bilateral salpingo-oophorectomy, left pelvic, right para-aortic and bifurcation SLN removal  Operative findings:  1) Colon adherent to posterior uterus, left sidewall and appendix 2) 2 right para-aortic nodes for SLN (high and PA node) 3) Aortic bifurcation nodes 4) Left obturator node  Diagnosis 1. Lymph node, sentinel, biopsy, right obturator - ONE BENIGN LYMPH NODE (0/1). 2. Lymph node, sentinel, biopsy, right peri-aortic - ONE BENIGN LYMPH NODE (0/1). 3. Lymph node, sentinel, biopsy, right lower peri-aortic - ONE BENIGN LYMPH NODE (0/1). 4. Lymph node, sentinel, biopsy, aortic bifurcation - ONE BENIGN LYMPH NODE (0/1). 5. Lymph node, sentinel, biopsy, left obturator - ONE BENIGN LYMPH NODE (0/1). 6. Uterus +/- tubes/ovaries, neoplastic - ENDOMETRIAL ADENOCARCINOMA, 1.7 CM WITH SUPERFICIAL MYOMETRIAL INVASION. - MARGINS NOT INVOLVED. - CERVIX, BILATERAL OVARIES AND BILATERAL FALLOPIAN TUBES FREE OF  TUMOR. Microscopic Comment 6. ONCOLOGY TABLE-UTERUS, CARCINOMA OR CARCINOSARCOMA Specimen: Uterus with bilateral fallopian tubes and ovaries and sentinel lymph node biopsies. Procedure: Hysterectomy with sentinel lymph nodes. Lymph node sampling performed: Yes. Specimen integrity: Intact. Maximum tumor size: 1.7 cm Histologic type: Endometrioid with squamous differentiation. Grade: 2 Myometrial invasion: 0.3 cm where myometrium is 1 cm in thickness Cervical stromal involvement: No. Extent of involvement of other organs: None, identified. Lymph - vascular invasion: Not identified.    05/13/2016 Imaging   05/03/16 CT guided drainage: IMPRESSION: Successful CT guided aspiration of approximately 15 cc of serous, slightly cloudy / milky fluid from the residual collection within the left hemipelvis. All aspirated samples were sent to the laboratory for cytologic and gram stain analysis.  Cytology was negative   07/2017 Progression   Shannon MUMPOWERin February 2019 presented to her primary physician with complaints of rectal bleeding.  She was scheduled for a colonoscopy and had taken the bowel prep when she presented with obstruction of her distal sigmoid colon.   08/18/2017 Imaging   maging 08/18/2017 The left ureter is dilated to the left hemipelvis. It becomes narrowed in the inflammatory pelvic mass. This is described below. Bladder is within normal limits.   Stomach/Bowel: Large hiatal hernia is unchanged. Stable prominent gastric folds within the hiatal hernia.   The colon is diffusely distended. Distal small bowel is distended. A transition point between dilated large bowel and decompressed large bowel occurs in the sigmoid colon. There is a heterogeneous ill-defined mass involving the sigmoid colon and left side of the pelvis which includes the colon wall, adjacent fat, and both fluid and gas elements. Findings are suspicious for a focal perforation with abscess formation.  Underlying malignancy is a strong consideration given the appearance and colon  obstruction. The mass also involves the left ureter causing an element of left ureteral dilatation worrisome for an element of left ureteral obstruction. The gas and fluid collection is very small measuring 1.8 x 1.2 cm on  image 109 of series 4. Overall mass size including the involved colon is 5.7 x 5.3 cm.    08/21/2017 Surgery   Shannon Scott underwent a diverting laparoscopic transverse loop colostomy and subsequent placement of a left ureteral stent.   08/2017 -  Radiation Therapy   She then received pelvic external beam radiation therapy    10/13/2017 - 11/17/2017 Chemotherapy   The patient had dexamethasone (DECADRON) 4 MG tablet, 8 mg, Oral, Daily, 1 of 1 cycle, Start date: --, End date: -- palonosetron (ALOXI) injection 0.25 mg, 0.25 mg, Intravenous,  Once, 6 of 6 cycles Administration: 0.25 mg (10/13/2017), 0.25 mg (10/20/2017), 0.25 mg (11/03/2017), 0.25 mg (11/10/2017), 0.25 mg (11/17/2017) CARBOplatin (PARAPLATIN) 150 mg in sodium chloride 0.9 % 100 mL chemo infusion, 150 mg (100 % of original dose 148 mg), Intravenous,  Once, 6 of 6 cycles Dose modification:   (original dose 148 mg, Cycle 1) Administration: 150 mg (10/13/2017), 150 mg (10/20/2017), 150 mg (11/03/2017), 150 mg (11/10/2017), 150 mg (11/17/2017) PACLitaxel (TAXOL) 132 mg in sodium chloride 0.9 % 250 mL chemo infusion (</= 75m/m2), 80 mg/m2 = 132 mg, Intravenous,  Once, 6 of 6 cycles Dose modification: 45 mg/m2 (original dose 80 mg/m2, Cycle 2, Reason: Other (see comments), Comment: Decreasing dose to 45 mg/m2 while undergoing XRT) Administration: 132 mg (10/13/2017), 72 mg (10/20/2017), 72 mg (11/03/2017), 72 mg (11/10/2017), 72 mg (11/17/2017)  for chemotherapy treatment.    01/18/2018 Imaging   notable for the presence of an IVC filter and 11 mm left internal iliac node that was previously 1.6 cm and a soft tissue mass in the left pelvic sidewall and  now measuring 3.9 x 2.5 cm.   03/03/2018 Imaging   PET  demonstrates a hypermetabolic left pelvic sidewall mass, greater than 3 weeks after completion of radiotherapy.   03/16/2018 -  Chemotherapy    Patient is on Treatment Plan: COLORECTAL PEMBROLIZUMAB Q21D      04/2018 Miscellaneous    CA 125  Ref. Range 05/04/2018 12:08 05/25/2018 11:21 06/15/2018 13:30 10/10/2018 12:20 01/23/2019 11:35  Cancer Antigen (CA) 125 Latest Ref Range: 0.0 - 38.1 U/mL 11.2 10.9 10.2 10.9 9.2     10/05/2018 Imaging   PET  3. Continued positive response to therapy within the left pelvic sidewall mass, which demonstrates residual low level hypermetabolism that has mildly decreased. 4. Resolved mediastinal nodal hypermetabolism. Nearly resolved bilateral hilar nodal hypermetabolism. These findings could represent resolving reactive nodes versus response to therapy within metastatic nodes. 5. No metabolic evidence of new sites of metastatic disease.   09/27/2019 Imaging   CT C/A/P: IMPRESSION: 1. Since the PET of 10/05/2018, similar to slight decrease in left pelvic sidewall soft tissue fullness, without well-defined mass. 2. No findings of  new or progressive disease. 3. Chronic or recurrent left renal pelvic and proximal ureteric wall thickening/mucosal hyperenhancement. Nonspecific, especially in the setting of a prior stent. Cannot exclude ascending infection. Similarly, improved bladder wall and surrounding edema which could simply be treatment related. Correlate with urinalysis to exclude cystitis. 4. Small hiatal hernia. 5. Transverse colostomy with similar small bowel containing parastomal hernia. 6.  Aortic Atherosclerosis (ICD10-I70.0).   03/17/2020 Imaging   CT C/A/P: 1. No evidence of cervical cancer local recurrence or metastatic disease. 2. LEFT  midline colostomy with peristomal hernia. Long segment small bowel enters the hernia sac without obstruction. 3. Chronic fractures sacral  insufficiency fractures     Interval History: Patient presents today overall noting not much change.  Since her stroke, she has had difficulty with her memory which has made it hard for her to remember to use and how to use her vaginal cream.  She denies having use the vaginal estrogen basically since I saw her last.  She notes that her appetite is improving.  She denies any nausea or emesis.  She has normal ostomy output.  She denies any urinary symptoms.  She denies any vaginal bleeding or discharge.  She is scheduled to see Dr. Marin Olp in April.  She has not had her port flushed in over 3 months.   She has noted some increased swelling of her left leg again and has reached out to her primary care provider.  Past Medical/Surgical History: Past Medical History:  Diagnosis Date  . A-fib ([place]) 03/26/2020  . Acute CVA (cerebrovascular accident) ([place]) 03/26/2020  . Arthritis   . Asthma    allergy induced  . Bilateral carotid artery disease ([facility])    L carotid bruit  . Bursitis    left hip  . CAD (coronary artery disease)   . Cancer ([place])   . Dyslipidemia    intolerant to statins, welchol, niacin, zetia  . Goals of care, counseling/discussion 10/11/2017  . History of blood transfusion   . History of nuclear stress test 04/24/2012   lexiscan; normal study  . Hypertension   . Hypothyroidism   . 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 ([place]) 10/11/2017  . Postoperative nausea and vomiting 01/02/2016    Past Surgical History:  Procedure Laterality Date  . ABDOMINAL HYSTERECTOMY    . Carotid Doppler  02/2012   40-59% right int carotid artery stenosis; 60-79% L int carotid stenosis; L carotid bruit  . CORONARY ARTERY BYPASS GRAFT  03/12/2004   LIMA to LAD, SVG to circumflex, SVG to PDA  . CYSTOSCOPY W/ URETERAL STENT PLACEMENT Left 12/06/2017   Procedure: CYSTOSCOPY WITH LEFT URETERAL STENT EXCHANGE;  Surgeon: Irine Seal, MD;  Location: [facility];   Service: Urology;  Laterality: Left;  . CYSTOSCOPY WITH STENT PLACEMENT Bilateral 08/21/2017   Procedure: CYSTOSCOPY WITH STENT PLACEMENT;  Surgeon: Ceasar Mons, MD;  Location: [facility];  Service: Urology;  Laterality: Bilateral;  . IR FLUORO GUIDE PORT INSERTION RIGHT  10/11/2017  . IR GENERIC HISTORICAL  04/29/2016   IR RADIOLOGIST EVAL & MGMT 04/29/2016 Sandi Mariscal, MD [facility]  . IR GENERIC HISTORICAL  05/12/2016   IR RADIOLOGIST EVAL & MGMT 05/12/2016 Sandi Mariscal, MD [facility] INTER[facility] RAD  . IR IVC FILTER PLMT / S&I /IMG GUID/MOD SED  10/11/2017  . IR US GUIDE VASC ACCESS RIGHT  10/11/2017  . ROBOTIC ASSISTED TOTAL HYSTERECTOMY WITH BILATERAL SALPINGO OOPHERECTOMY Bilateral 12/30/2015   Procedure: XI ROBOTIC ASSISTED TOTAL HYSTERECTOMY WITH BILATERAL SALPINGO OOPHORECTOMY WITH SENTAL LYMPH NODE BIOPSY;  Surgeon: Nancy Marus, MD;  Location: [facility];  Service: Gynecology;  Laterality: Bilateral;  . TONSILLECTOMY    . TRANSTHORACIC ECHOCARDIOGRAM  04/2007   EF>55%; mild MR; mild-mod TR; mild pulm HTN; mild calcification of aortiv valve leaflets with mild valvular aortic stenosis  . TUBAL LIGATION      Family History  Problem Relation Age of Onset  . Hypertension Mother   . Heart disease Mother  Died in her 52s  . Stroke Father   . Kidney disease Brother   . Heart disease Brother        also HTN, hyperlipidemia  . Heart attack Brother   . Stroke Sister        x2  . Hypertension Sister     Social History   Socioeconomic History  . Marital status: Widowed    Spouse name: Not on file  . Number of children: 2  . Years of education: Not on file  . Highest education level: Not on file  Occupational History    Employer: RETIRED  Tobacco Use  . Smoking status: Never Smoker  . Smokeless tobacco: Never Used  Vaping Use  . Vaping Use: Never used  Substance and Sexual Activity  . Alcohol use: No  . Drug use: No  . Sexual activity: Not Currently  Other Topics  Concern  . Not on file  Social History Narrative  . Not on file   Social Determinants of Health   Financial Resource Strain: Not on file  Food Insecurity: Not on file  Transportation Needs: Not on file  Physical Activity: Not on file  Stress: Not on file  Social Connections: Not on file    Current Medications:  Current Outpatient Medications:  .  acetaminophen (TYLENOL) 500 MG tablet, , Disp: , Rfl:  .  apixaban (ELIQUIS) 5 MG TABS tablet, Take 1 tablet (5 mg total) by mouth 2 (two) times daily., Disp: 60 tablet, Rfl: 0 .  conjugated estrogens (PREMARIN) vaginal cream, Place 1 Applicatorful vaginally 3 (three) times a week. Apply nightly for 2 weeks, then 3 times a week., Disp: 42.5 g, Rfl: 1 .  diltiazem (TIAZAC) 300 MG 24 hr capsule, TAKE 1 CAPSULE(300 MG) BY MOUTH DAILY (Patient taking differently: Take 300 mg by mouth daily.), Disp: 30 capsule, Rfl: 1 .  Evolocumab (REPATHA SURECLICK) 182 MG/ML SOAJ, Inject 1 Dose into the skin every 14 (fourteen) days., Disp: 2 mL, Rfl: 11 .  levothyroxine (SYNTHROID, LEVOTHROID) 50 MCG tablet, Take 1 tablet (50 mcg total) by mouth daily before breakfast., Disp: 30 tablet, Rfl: 1 .  polyethylene glycol (MIRALAX / GLYCOLAX) 17 g packet, Take 17 g by mouth daily as needed for severe constipation., Disp: 14 each, Rfl: 0 .  traMADol (ULTRAM) 50 MG tablet, TAKE 2 TABLETS(100 MG) BY MOUTH TWICE DAILY, Disp: 120 tablet, Rfl: 0 .  clobetasol ointment (TEMOVATE) 9.93 %, Apply 1 application topically 3 (three) times a week. (Patient not taking: Reported on 08/25/2020), Disp: 30 g, Rfl: 0 .  lidocaine (XYLOCAINE) 5 % ointment, Apply 1 application topically as needed. Up to TID for treatment of burning symptoms of the vulva (Patient not taking: Reported on 08/25/2020), Disp: 35.44 g, Rfl: 0 .  lidocaine-prilocaine (EMLA) cream, Apply a dime size to port-a-cath 1-2 hours prior to access. Cover with Saran wrap. (Patient not taking: Reported on 08/25/2020), Disp: 30  g, Rfl: 2 No current facility-administered medications for this visit.  Facility-Administered Medications Ordered in Other Visits:  .  sodium chloride flush (NS) 0.9 % injection 10 mL, 10 mL, Intracatheter, PRN, Volanda Napoleon, MD, 10 mL at 02/05/20 1440  Review of Systems: Endorses cold, hearing loss, left leg swelling, urinary frequency, joint pain, back pain Denies appetite changes, fevers, fatigue, unexplained weight changes. Denies neck lumps or masses, mouth sores, ringing in ears or voice changes. Denies cough or wheezing.  Denies shortness of breath. Denies chest pain or palpitations.  Denies  abdominal distention, pain, blood in stools, constipation, diarrhea, nausea, vomiting, or early satiety. Denies pain with intercourse, dysuria, hematuria or incontinence. Denies hot flashes, pelvic pain, vaginal bleeding or vaginal discharge.   Denies muscle pain/cramps. Denies itching, rash, or wounds. Denies dizziness, headaches, numbness or seizures. Denies swollen lymph nodes or glands, denies easy bruising or bleeding. Denies anxiety, depression, confusion, or decreased concentration.  Physical Exam: BP (!) 159/59 (BP Location: Left Arm, Patient Position: Sitting)   Pulse 76   Temp 98.7 F (37.1 C) (Tympanic)   Resp 18   Ht 5' 1"  (1.549 m)   Wt 145 lb 3.2 oz (65.9 kg)   SpO2 99%   BMI 27.44 kg/m  General: Alert, oriented, no acute distress. HEENT: Cephalic, atraumatic, sclera anicteric. Chest: Unlabored breathing on room air.  Clear to auscultation bilaterally.  Port site clean. Abdomen: soft, nontender.  Normoactive bowel sounds.  No masses or hepatosplenomegaly appreciated.  Well-healed incision.  Stoma with bag, stool present, ostomy pink and viable. Extremities: Grossly normal range of motion.  Warm, well perfused.  1-2+ edema left lower extremity with very mild erythema. Lymphatics: No cervical, supraclavicular, or inguinal adenopathy. GU: External genitalia notable for  significant desquamation of inner labia majora.  No discrete lesions noted.  No atypical vasculature, discharge, or bleeding.  Laboratory & Radiologic Studies: None new  Assessment & Plan: Shannon Scott is a 80 y.o. woman with recurrent endometrial cancer, most recent imaging at the beginning of the year was negative for any evidence of disease.  She had been on pembrolizumab for maintenance after recurrence although has been off treatment since September.  She presents today for continued vulvar symptoms.  Patient had some improvement of her vulvar symptoms after clobetasol use but there was significant thinning of her skin at her last visit and so we had transition to a trial of estrogen cream.  She underwent vulvar biopsy at [facility] showing inflammation although I suspect she has lichen sclerosis.  She has not been using the clobetasol cream but unfortunately has not been using the vaginal estrogen cream.  There is not much change in terms of her symptoms nor the appearance of her vulva.  We reviewed in detail how I would like her to use the cream and she was given written instructions today so that she can keep these by her bed to help with her memory.  In terms of her endometrial cancer, she has follow-up with Dr. Marin Olp her medical oncologist in the next couple of weeks.  She was started on pembrolizumab and received 19 cycles.  I think at this point, given a chemo break of about 6 months, I would recommend not reinitiating treatment unless she were to have signs or imaging findings concerning for recurrence.  38 minutes of total time was spent for this patient encounter, including preparation, face-to-face counseling with the patient and coordination of care, and documentation of the encounter.  Jeral Pinch, MD  Division of Gynecologic Oncology  Department of Obstetrics and Gynecology  [facility]

## 2020-08-25 NOTE — Patient Instructions (Signed)
It was good to see you today.  In terms of the cream, I would like you to use the vaginal estrogen (Premarin) cream.  Start by using this every night for 2 weeks and then decrease to every other night.  You only need to get this cream on the outside or just inside the labia.  I will see you in about 8 weeks to see how things are looking and feeling after you have use the cream more regularly.

## 2020-08-27 ENCOUNTER — Telehealth: Payer: Self-pay | Admitting: *Deleted

## 2020-08-27 NOTE — Telephone Encounter (Signed)
Patient called with appointment questions.  Has not had a port flush since December. Reviewed with her that we prefer every 2 month flush at minimum but waiting a few more weeks will be ok when she comes for appointment.  Reviewed all upcoming appointments.  Patient questioned a CT scan.  Dr Marin Olp states it is too soon.  Also questioned her infusion since her last scan was good.  Reviewed with patient that she still needs to get her infusion to prevent disease recurrence.  Patient understanding of instructions.

## 2020-09-04 ENCOUNTER — Ambulatory Visit (INDEPENDENT_AMBULATORY_CARE_PROVIDER_SITE_OTHER): Payer: Medicare Other | Admitting: Internal Medicine

## 2020-09-04 ENCOUNTER — Other Ambulatory Visit: Payer: Self-pay

## 2020-09-04 VITALS — BP 128/74 | HR 60 | Ht 61.0 in | Wt 145.0 lb

## 2020-09-04 DIAGNOSIS — Z8673 Personal history of transient ischemic attack (TIA), and cerebral infarction without residual deficits: Secondary | ICD-10-CM | POA: Diagnosis not present

## 2020-09-04 DIAGNOSIS — E78019 Familial hypercholesterolemia, unspecified: Secondary | ICD-10-CM

## 2020-09-04 DIAGNOSIS — E785 Hyperlipidemia, unspecified: Secondary | ICD-10-CM

## 2020-09-04 DIAGNOSIS — E7801 Familial hypercholesterolemia: Secondary | ICD-10-CM | POA: Diagnosis not present

## 2020-09-04 DIAGNOSIS — I480 Paroxysmal atrial fibrillation: Secondary | ICD-10-CM

## 2020-09-04 DIAGNOSIS — Z951 Presence of aortocoronary bypass graft: Secondary | ICD-10-CM

## 2020-09-04 NOTE — Progress Notes (Signed)
OFFICE NOTE  Chief Complaint:  Follow-up  Primary Care Physician: Leeroy Cha, MD  HPI:  Shannon Scott is a 80 year old female with severe combined dyslipidemia with CABG in 2005 who has been intolerant to statins, Welchol, niacin, Zetia, and other cholesterol-lower medications. In the past I had sent her to Dr. Moshe Cipro at [facility], who is a lipidologist, for evaluation for apheresis. However, due to the cost of close to $1000 a month she felt that was intolerable. Recently she was complaining of neck pain, underwent carotid Dopplers that you ordered, and those demonstrated 40% to 59% right internal carotid stenosis and a 60% to 79% left internal carotid stenosis. She does have a left carotid bruit. In addition, she is describing a band-like pressure around her chest which she attributed to her blood pressure medicine and then had actually discontinued that; however, the symptoms still persist. This is a little bit worse with exertion, does relieve with rest or taking blood pressure medicine, and is concerning for angina. She's also complaining of some left-sided numbness facial tingling which could be TIA. She has not been taking aspirin and she reports developing what sounds like petechiae on that, but is also not willing to take Plavix or other antiplatelet medications. She does also report pain center called for her body and her shoulders and her knees and her legs as well.  Shannon Scott returns today for follow-up. It's been about a year and a half since I seen her. She does have a history of multivessel coronary disease and prior bypass in 2005. Her last stress test was in 2013 which was negative for ischemia. An echo in 2008 showed probable mild aortic stenosis. She does have a murmur of aortic stenosis today which may be a little louder. Blood pressure is also poorly controlled today however she said she did not take her medicine. She says that she is taking only a half of her  herbs certain pill as it causes her more swelling if she takes the whole pill, despite the fact that contains a diuretic. She's been very intolerant to almost all medications including all of the statins and Zetia. No medicines of been tolerated and she continues to have a very high cholesterol over 300. I do think she be a good candidate for a PC SK 9 inhibitor.  Shannon Scott returns today for follow-up. She is recently started on BiDil and report some mild improvement in blood pressure although she feels when her blood pressure gets low that she is symptomatic. She may have some degree of lack of cerebral autoregulation due to long-standing hypertension. Unfortunate she's been intolerant to all medications. She's been tried on all of the statin medications, WelChol, niacin and steady and has been intolerant. I referred her to [facility] in 2013 for apheresis, however due to the very high cost this was never pursued. With the recent approval of PC SK 9 inhibitors, I think this would be a very good option for her. She has known ASCVD and FH based on her lipid profile. Her most recent cholesterol demonstrated total cholesterol 401, triglycerides 91, HDL 50, and LDL 333. Based on these findings her Dutch score is 8.  08/02/2016  Shannon Scott returns today for follow-up. Although she is asymptomatic, she still has significant untreated risk factors. Most notably she likely has FH. Her brother died recently suddenly, which is suspicious for cardiovascular disease. She does have 2 children, however who apparently do not have any significant cholesterol abnormalities. She does  also have a parent to had premature coronary artery disease. Her most recent LDL cholesterol was over 300 but has not been checked in a number of years. She's been intolerant to all statins, Zetia and WelChol. She previously been referred for lipoprotein apheresis however did not make that appointment due to the cost.  11/18/2016  Shannon Scott  was seen today again in follow-up. She underwent recent lipid testing which again is consistent with either FH or possibly an autosomal recessive LDL receptor defect or gain of function mutation and PCSK9. LDL particle numbers remain elevated at 2191, despite this she has an A pattern. Total cholesterol 333 with LDL-C 257 and HDL-C 56. Non-HDL cholesterol of 277. She is speaking with our pharmacist about starting Redpath but reports that she is not interested in medication. She also says she does not think she can afford the medication and can barely pay for house meds. Blood pressure remains not at goal today. She reports pain short of breath and having lower extremity swelling. Recently doxazosin was added at night - but she's not noted any significant difference in her blood pressure.  06/03/2017  Shannon Scott returns today for follow-up of hypertension and dyslipidemia.  As previously mentioned she has FH with significantly elevated LDL greater than 250 -given her premature CAD, family history of early CAD, very high LDL, apo B and LpA numbers, she may have HoFH.  She was approved for treatment with Repatha, however is afraid of needles and is not interested in that therapy.  There is potentially one other option for her and that would be Juxtapid. Clinical profile certainly fits HoFH.  This is the only oral therapy on the market for familial hyperlipidemia.  I believe this certainly could be an option for her, however it has its own side effect profile.  We will certainly need to monitor her liver function closely as specified through the [facility] proram.  Blood pressure is again noted to be elevated today.  She says this may be related to problems with a left hip bursitis.   09/18/2019  Shannon Scott returns today for follow-up.  Overall she is without any significant complaints.  She has continued to struggle with cancer and is still undergoing some chemotherapy.  She ultimately had to have colostomy and is not clear  whether that would be reversed.  She has been uninterested in any lipid lowering medications particular PCSK9 inhibitors and is been statin intolerant.  We discussed some other options however she was hesitant to consider them.  Today we also discussed the possibility of enrolling her in a clinical trial.  She may very well qualify for [facility] for, which is a study of inclisiran, a short interfering RNA to PCSK9.   09/04/2020  Shannon Scott is seen today in follow-up.  In the interim she had been seen by Almyra Deforest, PA-C.  They discussed starting Repatha and she was agreeable to it.  Fortunately she has tolerated it well.  She has not had her lipids rechecked but she has had at least 6 doses of the medication.  She does have upcoming labs drawn from her port through the cancer center.  She continues to undergo chemotherapy for metastatic uterine cancer.  Cholesterol has been very high with LDL in the 300s.  She also has a history of coronary disease and prior bypass.  PMHx:  Past Medical History:  Diagnosis Date  . A-fib ([place]) 03/26/2020  . Acute CVA (cerebrovascular accident) ([place]) 03/26/2020  . Arthritis   .  Asthma    allergy induced  . Bilateral carotid artery disease ([facility])    L carotid bruit  . Bursitis    left hip  . CAD (coronary artery disease)   . Cancer ([place])   . Dyslipidemia    intolerant to statins, welchol, niacin, zetia  . Goals of care, counseling/discussion 10/11/2017  . History of blood transfusion   . History of nuclear stress test 04/24/2012   lexiscan; normal study  . Hypertension   . Hypothyroidism   . 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 ([place]) 10/11/2017  . Postoperative nausea and vomiting 01/02/2016    Past Surgical History:  Procedure Laterality Date  . ABDOMINAL HYSTERECTOMY    . Carotid Doppler  02/2012   40-59% right int carotid artery stenosis; 60-79% L int carotid stenosis; L carotid bruit  . CORONARY ARTERY BYPASS GRAFT   03/12/2004   LIMA to LAD, SVG to circumflex, SVG to PDA  . CYSTOSCOPY W/ URETERAL STENT PLACEMENT Left 12/06/2017   Procedure: CYSTOSCOPY WITH LEFT URETERAL STENT EXCHANGE;  Surgeon: Irine Seal, MD;  Location: [facility];  Service: Urology;  Laterality: Left;  . CYSTOSCOPY WITH STENT PLACEMENT Bilateral 08/21/2017   Procedure: CYSTOSCOPY WITH STENT PLACEMENT;  Surgeon: Ceasar Mons, MD;  Location: [facility];  Service: Urology;  Laterality: Bilateral;  . IR FLUORO GUIDE PORT INSERTION RIGHT  10/11/2017  . IR GENERIC HISTORICAL  04/29/2016   IR RADIOLOGIST EVAL & MGMT 04/29/2016 Sandi Mariscal, MD [facility] INTERV RAD  . IR GENERIC HISTORICAL  05/12/2016   IR RADIOLOGIST EVAL & MGMT 05/12/2016 Sandi Mariscal, MD [facility] INTERV RAD  . IR IVC FILTER PLMT / S&I /IMG GUID/MOD SED  10/11/2017  . IR US GUIDE VASC ACCESS RIGHT  10/11/2017  . ROBOTIC ASSISTED TOTAL HYSTERECTOMY WITH BILATERAL SALPINGO OOPHERECTOMY Bilateral 12/30/2015   Procedure: XI ROBOTIC ASSISTED TOTAL HYSTERECTOMY WITH BILATERAL SALPINGO OOPHORECTOMY WITH SENTAL LYMPH NODE BIOPSY;  Surgeon: Nancy Marus, MD;  Location: [facility];  Service: Gynecology;  Laterality: Bilateral;  . TONSILLECTOMY    . TRANSTHORACIC ECHOCARDIOGRAM  04/2007   EF>55%; mild MR; mild-mod TR; mild pulm HTN; mild calcification of aortiv valve leaflets with mild valvular aortic stenosis  . TUBAL LIGATION      FAMHx:  Family History  Problem Relation Age of Onset  . Hypertension Mother   . Heart disease Mother        Died in her 66s  . Stroke Father   . Kidney disease Brother   . Heart disease Brother        also HTN, hyperlipidemia  . Heart attack Brother   . Stroke Sister        x2  . Hypertension Sister     SOCHx:   reports that she has never smoked. She has never used smokeless tobacco. She reports that she does not drink alcohol and does not use drugs.  ALLERGIES:  Allergies  Allergen Reactions  . Statins Other (See Comments)    Myalgias and memory  problems  . Ciprofloxacin Itching    Splotchy redness with itching during IV infusion localized to arm.    ROS: Pertinent items noted in HPI and remainder of comprehensive ROS otherwise negative.  HOME MEDS: Current Outpatient Medications  Medication Sig Dispense Refill  . acetaminophen (TYLENOL) 500 MG tablet     . apixaban (ELIQUIS) 5 MG TABS tablet Take 1 tablet (5 mg total) by mouth 2 (two) times daily. 60 tablet 0  .  clobetasol ointment (TEMOVATE) 1.77 % Apply 1 application topically 3 (three) times a week. 30 g 0  . conjugated estrogens (PREMARIN) vaginal cream Place 1 Applicatorful vaginally 3 (three) times a week. Apply nightly for 2 weeks, then 3 times a week. 42.5 g 1  . diltiazem (TIAZAC) 300 MG 24 hr capsule TAKE 1 CAPSULE(300 MG) BY MOUTH DAILY (Patient taking differently: Take 300 mg by mouth daily.) 30 capsule 1  . Evolocumab (REPATHA SURECLICK) 116 MG/ML SOAJ Inject 1 Dose into the skin every 14 (fourteen) days. 2 mL 11  . levothyroxine (SYNTHROID, LEVOTHROID) 50 MCG tablet Take 1 tablet (50 mcg total) by mouth daily before breakfast. 30 tablet 1  . lidocaine (XYLOCAINE) 5 % ointment Apply 1 application topically as needed. Up to TID for treatment of burning symptoms of the vulva 35.44 g 0  . lidocaine-prilocaine (EMLA) cream Apply a dime size to port-a-cath 1-2 hours prior to access. Cover with Saran wrap. 30 g 2  . polyethylene glycol (MIRALAX / GLYCOLAX) 17 g packet Take 17 g by mouth daily as needed for severe constipation. 14 each 0  . traMADol (ULTRAM) 50 MG tablet TAKE 2 TABLETS(100 MG) BY MOUTH TWICE DAILY 120 tablet 0   No current facility-administered medications for this visit.   Facility-Administered Medications Ordered in Other Visits  Medication Dose Route Frequency Provider Last Rate Last Admin  . sodium chloride flush (NS) 0.9 % injection 10 mL  10 mL Intracatheter PRN Volanda Napoleon, MD   10 mL at 02/05/20 1440    LABS/IMAGING: No results found for  this or any previous visit (from the past 48 hour(s)). No results found.  VITALS: BP 128/74   Pulse 60   Ht 5' 1"  (1.549 m)   Wt 145 lb (65.8 kg)   SpO2 97%   BMI 27.40 kg/m   EXAM: General appearance: alert and no distress Neck: no adenopathy, + left carotid bruit, no JVD, supple, symmetrical, trachea midline and thyroid not enlarged, symmetric, no tenderness/mass/nodules Lungs: clear to auscultation bilaterally Heart: regular rate and rhythm, S1, S2 normal, no murmur, click, rub or gallop Abdomen: soft, non-tender; bowel sounds normal; no masses,  no organomegaly Extremities: extremities normal, atraumatic, no cyanosis or edema Pulses: 2+ and symmetric Skin: Skin color, texture, turgor normal. No rashes or lesions Neurologic: Grossly normal  EKG: Normal sinus rhythm at 61, minimal voltage criteria for LVH, nonspecific T wave changes-personally reviewed-personally reviewed  ASSESSMENT: 1. Familial hypercholesterolemia (possibly HoFH) with a [place] score of 8, also consider LDL adapter protein deficiency or GOF mutation in PCSK9 2. History of TIAs 3. Multidrug intolerance - including statins, welchol, zetia, etc, but also less than expected response to therapy  3.   CABG x 3 (2005) 4.   Known moderate to severe bilateral carotid artery disease  5.   Uncontrolled hypertension 6.   Aortic stenosis (LPa - 123), ApoB of 175 7.  Advanced and recurrent endometrial cancer 8.  Bowel obstruction status post ostomy  PLAN: 1.   [name] a significant dyslipidemia with her most recent LDL cholesterol of 358 in October 2021.  Subsequently she has been on Repatha and tolerating it well.  I like to repeat her lipids which could be drawn during her upcoming lab visit.  She is also been struggling with chronic lower extremity edema of the left leg.  She has had difficulty with compression stockings.  She was getting therapy to Ace wrap it.  I have advised her to consider  getting stockings  remeasured or am happy certainly to refer her back to therapy where they can continue treatment for her edema.  Follow-up with me in 6 months or sooner as necessary.  Pixie Casino, MD, [facility], [place] Director of the Advanced Lipid Disorders &  Cardiovascular Risk Reduction Clinic Diplomate of the American Board of Clinical Lipidology Attending Cardiologist  Direct Dial: [phone]  Fax: [phone]  Website:  [email or web address] [email or web address] 09/04/2020, 3:55 PM

## 2020-09-04 NOTE — Patient Instructions (Addendum)
Medication Instructions:  Your physician recommends that you continue on your current medications as directed. Please refer to the Current Medication list given to you today.  *If you need a refill on your cardiac medications before your next appointment, please call your pharmacy*   Lab Work: FASTING lab work to check cholesterol   If you have labs (blood work) drawn today and your tests are completely normal, you will receive your results only by: Marland Kitchen MyChart Message (if you have MyChart) OR . A paper copy in the mail If you have any lab test that is abnormal or we need to change your treatment, we will call you to review the results.   Testing/Procedures: NONE   Follow-Up: At [facility], you and your health needs are our priority.  As part of our continuing mission to provide you with exceptional heart care, we have created designated Provider Care Teams.  These Care Teams include your primary Cardiologist (physician) and Advanced Practice Providers (APPs -  Physician Assistants and Nurse Practitioners) who all work together to provide you with the care you need, when you need it.  We recommend signing up for the patient portal called "MyChart".  Sign up information is provided on this After Visit Summary.  MyChart is used to connect with patients for Virtual Visits (Telemedicine).  Patients are able to view lab/test results, encounter notes, upcoming appointments, etc.  Non-urgent messages can be sent to your provider as well.   To learn more about what you can do with MyChart, go to [email or web address].    Your next appointment:   6 month(s)  The format for your next appointment:   In Person  Provider:   You may see Pixie Casino, MD or one of the following Advanced Practice Providers on your designated Care Team:    Almyra Deforest, PA-C  Fabian Sharp, [place] or   Roby Lofts, [place]    Other Instructions  Please contact us with the name of the office/clinic you went  to for leg wraps for swelling so we can do a referral

## 2020-09-05 ENCOUNTER — Ambulatory Visit: Payer: Medicare Other

## 2020-09-05 ENCOUNTER — Ambulatory Visit: Payer: Medicare Other | Admitting: Family

## 2020-09-05 ENCOUNTER — Other Ambulatory Visit: Payer: Medicare Other

## 2020-09-08 ENCOUNTER — Inpatient Hospital Stay: Payer: Medicare Other | Attending: Hematology & Oncology

## 2020-09-08 ENCOUNTER — Inpatient Hospital Stay (HOSPITAL_BASED_OUTPATIENT_CLINIC_OR_DEPARTMENT_OTHER): Payer: Medicare Other | Admitting: Family

## 2020-09-08 ENCOUNTER — Inpatient Hospital Stay: Payer: Medicare Other

## 2020-09-08 ENCOUNTER — Encounter: Payer: Self-pay | Admitting: Family

## 2020-09-08 ENCOUNTER — Other Ambulatory Visit: Payer: Self-pay

## 2020-09-08 VITALS — BP 155/48 | HR 73 | Temp 98.3°F | Resp 18 | Ht 61.0 in | Wt 138.0 lb

## 2020-09-08 DIAGNOSIS — C541 Malignant neoplasm of endometrium: Secondary | ICD-10-CM | POA: Diagnosis present

## 2020-09-08 DIAGNOSIS — Z5112 Encounter for antineoplastic immunotherapy: Secondary | ICD-10-CM | POA: Insufficient documentation

## 2020-09-08 DIAGNOSIS — C539 Malignant neoplasm of cervix uteri, unspecified: Secondary | ICD-10-CM

## 2020-09-08 DIAGNOSIS — E032 Hypothyroidism due to medicaments and other exogenous substances: Secondary | ICD-10-CM

## 2020-09-08 DIAGNOSIS — Z923 Personal history of irradiation: Secondary | ICD-10-CM | POA: Diagnosis not present

## 2020-09-08 DIAGNOSIS — D508 Other iron deficiency anemias: Secondary | ICD-10-CM

## 2020-09-08 DIAGNOSIS — Z79899 Other long term (current) drug therapy: Secondary | ICD-10-CM | POA: Insufficient documentation

## 2020-09-08 DIAGNOSIS — C799 Secondary malignant neoplasm of unspecified site: Secondary | ICD-10-CM

## 2020-09-08 DIAGNOSIS — R21 Rash and other nonspecific skin eruption: Secondary | ICD-10-CM | POA: Insufficient documentation

## 2020-09-08 LAB — CMP (CANCER CENTER ONLY)
ALT: 7 U/L (ref 0–44)
AST: 14 U/L — ABNORMAL LOW (ref 15–41)
Albumin: 4.1 g/dL (ref 3.5–5.0)
Alkaline Phosphatase: 67 U/L (ref 38–126)
Anion gap: 8 (ref 5–15)
BUN: 15 mg/dL (ref 8–23)
CO2: 27 mmol/L (ref 22–32)
Calcium: 9.7 mg/dL (ref 8.9–10.3)
Chloride: 103 mmol/L (ref 98–111)
Creatinine: 0.8 mg/dL (ref 0.44–1.00)
GFR, Estimated: >60 mL/min (ref >60)
Glucose, Bld: 95 mg/dL (ref 70–99)
Potassium: 4 mmol/L (ref 3.5–5.1)
Sodium: 138 mmol/L (ref 135–145)
Total Bilirubin: 0.4 mg/dL (ref 0.3–1.2)
Total Protein: 6.9 g/dL (ref 6.5–8.1)

## 2020-09-08 LAB — CBC WITH DIFFERENTIAL (CANCER CENTER ONLY)
Abs Immature Granulocytes: 0.01 10*3/uL (ref 0.00–0.07)
Basophils Absolute: 0 10*3/uL (ref 0.0–0.1)
Basophils Relative: 1 %
Eosinophils Absolute: 0 10*3/uL (ref 0.0–0.5)
Eosinophils Relative: 1 %
HCT: 34.1 % — ABNORMAL LOW (ref 36.0–46.0)
Hemoglobin: 11.3 g/dL — ABNORMAL LOW (ref 12.0–15.0)
Immature Granulocytes: 0 %
Lymphocytes Relative: 14 %
Lymphs Abs: 0.8 10*3/uL (ref 0.7–4.0)
MCH: 31.6 pg (ref 26.0–34.0)
MCHC: 33.1 g/dL (ref 30.0–36.0)
MCV: 95.3 fL (ref 80.0–100.0)
Monocytes Absolute: 0.4 10*3/uL (ref 0.1–1.0)
Monocytes Relative: 6 %
Neutro Abs: 4.3 10*3/uL (ref 1.7–7.7)
Neutrophils Relative %: 78 %
Platelet Count: 280 10*3/uL (ref 150–400)
RBC: 3.58 MIL/uL — ABNORMAL LOW (ref 3.87–5.11)
RDW: 12.2 % (ref 11.5–15.5)
WBC Count: 5.5 10*3/uL (ref 4.0–10.5)
nRBC: 0 % (ref 0.0–0.2)

## 2020-09-08 LAB — LACTATE DEHYDROGENASE: LDH: 133 U/L (ref 98–192)

## 2020-09-08 MED ORDER — SODIUM CHLORIDE 0.9 % IV SOLN
Freq: Once | INTRAVENOUS | Status: AC
Start: 2020-09-08 — End: 2020-09-08
  Filled 2020-09-08: qty 250

## 2020-09-08 MED ORDER — PEMBROLIZUMAB CHEMO INJECTION 100 MG/4ML
400.0000 mg | Freq: Once | INTRAVENOUS | Status: AC
  Administered 2020-09-08: 400 mg via INTRAVENOUS
  Filled 2020-09-08: qty 16

## 2020-09-08 MED ORDER — ZOLEDRONIC ACID 4 MG/5ML IV CONC
3.3000 mg | Freq: Once | INTRAVENOUS | Status: AC
  Administered 2020-09-08: 3.3 mg via INTRAVENOUS
  Filled 2020-09-08: qty 4.13

## 2020-09-08 MED ORDER — SODIUM CHLORIDE 0.9% FLUSH
10.0000 mL | INTRAVENOUS | Status: DC | PRN
  Administered 2020-09-08: 10 mL
  Filled 2020-09-08: qty 10

## 2020-09-08 MED ORDER — SODIUM CHLORIDE 0.9 % IV SOLN
Freq: Once | INTRAVENOUS | Status: AC
  Filled 2020-09-08: qty 250

## 2020-09-08 MED ORDER — HEPARIN SOD (PORK) LOCK FLUSH 100 UNIT/ML IV SOLN
500.0000 [IU] | Freq: Once | INTRAVENOUS | Status: AC | PRN
  Administered 2020-09-08: 500 [IU]
  Filled 2020-09-08: qty 5

## 2020-09-08 NOTE — Patient Instructions (Signed)
Implanted [facility] Guide An implanted port is a device that is placed under the skin. It is usually placed in the chest. The device can be used to give IV medicine, to take blood, or for dialysis. You may have an implanted port if:  You need IV medicine that would be irritating to the small veins in your hands or arms.  You need IV medicines, such as antibiotics, for a long period of time.  You need IV nutrition for a long period of time.  You need dialysis. When you have a port, your health care provider can choose to use the port instead of veins in your arms for these procedures. You may have fewer limitations when using a port than you would if you used other types of long-term IVs, and you will likely be able to return to normal activities after your incision heals. An implanted port has two main parts:  Reservoir. The reservoir is the part where a needle is inserted to give medicines or draw blood. The reservoir is round. After it is placed, it appears as a small, raised area under your skin.  Catheter. The catheter is a thin, flexible tube that connects the reservoir to a vein. Medicine that is inserted into the reservoir goes into the catheter and then into the vein. How is my port accessed? To access your port:  A numbing cream may be placed on the skin over the port site.  Your health care provider will put on a mask and sterile gloves.  The skin over your port will be cleaned carefully with a germ-killing soap and allowed to dry.  Your health care provider will gently pinch the port and insert a needle into it.  Your health care provider will check for a blood return to make sure the port is in the vein and is not clogged.  If your port needs to remain accessed to get medicine continuously (constant infusion), your health care provider will place a clear bandage (dressing) over the needle site. The dressing and needle will need to be changed every week, or as told by your  health care provider. What is flushing? Flushing helps keep the port from getting clogged. Follow instructions from your health care provider about how and when to flush the port. Ports are usually flushed with saline solution or a medicine called heparin. The need for flushing will depend on how the port is used:  If the port is only used from time to time to give medicines or draw blood, the port may need to be flushed: ? Before and after medicines have been given. ? Before and after blood has been drawn. ? As part of routine maintenance. Flushing may be recommended every 4-6 weeks.  If a constant infusion is running, the port may not need to be flushed.  Throw away any syringes in a disposal container that is meant for sharp items (sharps container). You can buy a sharps container from a pharmacy, or you can make one by using an empty hard plastic bottle with a cover. How long will my port stay implanted? The port can stay in for as long as your health care provider thinks it is needed. When it is time for the port to come out, a surgery will be done to remove it. The surgery will be similar to the procedure that was done to put the port in. Follow these instructions at home:  Flush your port as told by your health care  provider.  If you need an infusion over several days, follow instructions from your health care provider about how to take care of your port site. Make sure you: ? Wash your hands with soap and water before you change your dressing. If soap and water are not available, use alcohol-based hand sanitizer. ? Change your dressing as told by your health care provider. ? Place any used dressings or infusion bags into a plastic bag. Throw that bag in the trash. ? Keep the dressing that covers the needle clean and dry. Do not get it wet. ? Do not use scissors or sharp objects near the tube. ? Keep the tube clamped, unless it is being used.  Check your port site every day for signs  of infection. Check for: ? Redness, swelling, or pain. ? Fluid or blood. ? Pus or a bad smell.  Protect the skin around the port site. ? Avoid wearing bra straps that rub or irritate the site. ? Protect the skin around your port from seat belts. Place a soft pad over your chest if needed.  Bathe or shower as told by your health care provider. The site may get wet as long as you are not actively receiving an infusion.  Return to your normal activities as told by your health care provider. Ask your health care provider what activities are safe for you.  Carry a medical alert card or wear a medical alert bracelet at all times. This will let health care providers know that you have an implanted port in case of an emergency.   Get help right away if:  You have redness, swelling, or pain at the port site.  You have fluid or blood coming from your port site.  You have pus or a bad smell coming from the port site.  You have a fever. Summary  Implanted ports are usually placed in the chest for long-term IV access.  Follow instructions from your health care provider about flushing the port and changing bandages (dressings).  Take care of the area around your port by avoiding clothing that puts pressure on the area, and by watching for signs of infection.  Protect the skin around your port from seat belts. Place a soft pad over your chest if needed.  Get help right away if you have a fever or you have redness, swelling, pain, drainage, or a bad smell at the port site. This information is not intended to replace advice given to you by your health care provider. Make sure you discuss any questions you have with your health care provider. Document Revised: 10/01/2019 Document Reviewed: 10/01/2019 Elsevier Patient Education  [place].

## 2020-09-08 NOTE — Patient Instructions (Signed)
[facility] Discharge Instructions for Patients Receiving Chemotherapy  Today you received the following chemotherapy agents Keytruda and Zometa  To help prevent nausea and vomiting after your treatment, we encourage you to take your nausea medication as prescribed by MD.   If you develop nausea and vomiting that is not controlled by your nausea medication, call the clinic.   BELOW ARE SYMPTOMS THAT SHOULD BE REPORTED IMMEDIATELY:  *FEVER GREATER THAN 100.5 F  *CHILLS WITH OR WITHOUT FEVER  NAUSEA AND VOMITING THAT IS NOT CONTROLLED WITH YOUR NAUSEA MEDICATION  *UNUSUAL SHORTNESS OF BREATH  *UNUSUAL BRUISING OR BLEEDING  TENDERNESS IN MOUTH AND THROAT WITH OR WITHOUT PRESENCE OF ULCERS  *URINARY PROBLEMS  *BOWEL PROBLEMS  UNUSUAL RASH Items with * indicate a potential emergency and should be followed up as soon as possible.  Feel free to call the clinic should you have any questions or concerns. The clinic phone number is [phone].  Please show the [facility] [facility] at check-in to the Emergency Department and triage nurse.

## 2020-09-08 NOTE — Progress Notes (Signed)
Hematology and Oncology Follow Up Visit  Shannon Scott [id number] [birth year] 80 y.o. 09/08/2020   Principle Diagnosis:  Locally advanced/recurrent endometrial carcinoma undifferentiated -- TMB (HIGH) / MSI HIGH  Past Therapy: Radiation therapyfor 5 -5 1/2 weeks - s/p 20 fractions Taxol/carboplatinumq 7 days -- s/p cycle 6  Current Therapy: Pembrolizumab 400 mg q 12 weeks, s/p cycle 19 (originally startedon 01/23/2019 at 400 mg IV q 6 wk)  Zometa 4 mg IV q 3 months -- next dose on 11/2020 IV iron as indicated   Interim History:  Shannon Scott is here today for follow-up. Shannon Scott is doing fairly well. Shannon Scott has missed several appointments since December but is back today for treatment. We will follow-up with repeat scans in 3 weeks.  Shannon Scott has a vaginal rash that has also effected her intergluteal cleft. Shannon Scott has seen Dr. Berline Lopes and has started vaginal estrogen and feels this is starting to help. No fever, chills, cough, dizziness, SOB, chest pain, palpitations, abdominal pain or changes in bowel or bladder habits.  Shannon Scott takes Miralax daily to prevent constipation.  Shannon Scott states that her ostomy is functioning appropriately.  Shannon Scott has occasional nausea without vomiting. Shannon Scott states that ginger chews help resolve her symptoms.  Shannon Scott tripped on her purse strap 2 weeks ago and had a fall. Thankfully, Shannon Scott states that Shannon Scott was not seriously injured.  No syncope.  Shannon Scott is ambulating with her cane for added support.  Shannon Scott recently got another steroid in her lower back and states that her pain is much improved.  Shannon Scott has chronic swelling in the left leg seems to be unchanged. No redness, open lesions or weeping noted. Shannon Scott plans to continue wrapping it to help reduce fluid retention as this has helped in the past.  Pedal pulses are 2+.  Shannon Scott has not noted any blood loss. No bruising or petechiae.  Shannon Scott has maintained a good appetite but admits that Shannon Scott needs to stay better hydrated. Her weight is  stable at 138 lbs since her last visit.   ECOG Performance Status: 2 - Symptomatic, <50% confined to bed  Medications:  Allergies as of 09/08/2020      Reactions   Statins Other (See Comments)   Myalgias and memory problems   Ciprofloxacin Itching   Splotchy redness with itching during IV infusion localized to arm.      Medication List       Accurate as of September 08, 2020  2:15 PM. If you have any questions, ask your nurse or doctor.        acetaminophen 500 MG tablet Commonly known as: TYLENOL   apixaban 5 MG Tabs tablet Commonly known as: ELIQUIS Take 1 tablet (5 mg total) by mouth 2 (two) times daily.   clobetasol ointment 0.05 % Commonly known as: TEMOVATE Apply 1 application topically 3 (three) times a week.   conjugated estrogens vaginal cream Commonly known as: PREMARIN Place 1 Applicatorful vaginally 3 (three) times a week. Apply nightly for 2 weeks, then 3 times a week.   diltiazem 300 MG 24 hr capsule Commonly known as: TIAZAC TAKE 1 CAPSULE(300 MG) BY MOUTH DAILY What changed: See the new instructions.   levothyroxine 50 MCG tablet Commonly known as: SYNTHROID Take 1 tablet (50 mcg total) by mouth daily before breakfast.   lidocaine 5 % ointment Commonly known as: XYLOCAINE Apply 1 application topically as needed. Up to TID for treatment of burning symptoms of the vulva   lidocaine-prilocaine cream Commonly known as: EMLA Apply  a dime size to port-a-cath 1-2 hours prior to access. Cover with Saran wrap.   polyethylene glycol 17 g packet Commonly known as: MIRALAX / GLYCOLAX Take 17 g by mouth daily as needed for severe constipation.   Repatha SureClick 094 MG/ML Soaj Generic drug: Evolocumab Inject 1 Dose into the skin every 14 (fourteen) days.   traMADol 50 MG tablet Commonly known as: ULTRAM TAKE 2 TABLETS(100 MG) BY MOUTH TWICE DAILY       Allergies:  Allergies  Allergen Reactions  . Statins Other (See Comments)    Myalgias and memory  problems  . Ciprofloxacin Itching    Splotchy redness with itching during IV infusion localized to arm.    Past Medical History, Surgical history, Social history, and Family History were reviewed and updated.  Review of Systems: All other 10 point review of systems is negative.   Physical Exam:  height is 5' 1" (1.549 m) and weight is 138 lb (62.6 kg). Her oral temperature is 98.3 F (36.8 C). Her blood pressure is 155/48 (abnormal) and her pulse is 73. Her respiration is 18 and oxygen saturation is 98%.   Wt Readings from Last 3 Encounters:  09/08/20 138 lb (62.6 kg)  09/04/20 145 lb (65.8 kg)  08/25/20 145 lb 3.2 oz (65.9 kg)    Ocular: Sclerae unicteric, pupils equal, round and reactive to light Ear-nose-throat: Oropharynx clear, dentition fair Lymphatic: No cervical or supraclavicular adenopathy Lungs no rales or rhonchi, good excursion bilaterally Heart regular rate and rhythm, no murmur appreciated Abd soft, nontender, positive bowel sounds, no liver or spleen tip palpated, no fluid wave  MSK no focal spinal tenderness, no joint edema Neuro: non-focal, well-oriented, appropriate affect Breasts: Deferred   Lab Results  Component Value Date   WBC 5.5 09/08/2020   HGB 11.3 (L) 09/08/2020   HCT 34.1 (L) 09/08/2020   MCV 95.3 09/08/2020   PLT 280 09/08/2020   Lab Results  Component Value Date   FERRITIN 614 (H) 05/20/2020   IRON 55 05/20/2020   TIBC 220 (L) 05/20/2020   UIBC 165 05/20/2020   IRONPCTSAT 25 05/20/2020   Lab Results  Component Value Date   RETICCTPCT 0.5 (L) 10/20/2017   RBC 3.58 (L) 09/08/2020   No results found for: KPAFRELGTCHN, LAMBDASER, KAPLAMBRATIO No results found for: IGGSERUM, IGA, IGMSERUM No results found for: Odetta Pink, SPEI   Chemistry      Component Value Date/Time   NA 138 05/20/2020 0955   NA 136 (A) 09/08/2017 0000   K 3.7 05/20/2020 0955   CL 102 05/20/2020 0955    CO2 29 05/20/2020 0955   BUN 13 05/20/2020 0955   BUN 8 09/08/2017 0000   CREATININE 0.77 05/20/2020 0955   CREATININE 0.69 08/02/2016 1519   GLU 111 09/08/2017 0000      Component Value Date/Time   CALCIUM 9.9 05/20/2020 0955   ALKPHOS 52 05/20/2020 0955   AST 13 (L) 05/20/2020 0955   ALT 7 05/20/2020 0955   BILITOT 0.3 05/20/2020 0955       Impression and Plan: Shannon Scott is a very pleasant 80 yo caucasian female withrecurrent undifferentiated endometrial carcinoma. We will proceed with treatment today including Zometa.  Repeat scans in 3 weeks.  Iron studies, B 12 and TSH are pending.  We will plan to follow-up in 8 weeks.  Shannon Scott was encouraged to contact our office with any questions or concerns.   Laverna Peace, NP 4/11/20222:15 PM

## 2020-09-09 ENCOUNTER — Telehealth: Payer: Self-pay | Admitting: *Deleted

## 2020-09-09 LAB — IRON AND TIBC
Iron: 36 ug/dL — ABNORMAL LOW (ref 41–142)
Saturation Ratios: 16 % — ABNORMAL LOW (ref 21–57)
TIBC: 230 ug/dL — ABNORMAL LOW (ref 236–444)
UIBC: 194 ug/dL (ref 120–384)

## 2020-09-09 LAB — LIPID PANEL
Chol/HDL Ratio: 4.5 ratio — ABNORMAL HIGH (ref 0.0–4.4)
Cholesterol, Total: 278 mg/dL — ABNORMAL HIGH (ref 100–199)
HDL: 62 mg/dL (ref >39)
LDL Chol Calc (NIH): 198 mg/dL — ABNORMAL HIGH (ref 0–99)
Triglycerides: 104 mg/dL (ref 0–149)
VLDL Cholesterol Cal: 18 mg/dL (ref 5–40)

## 2020-09-09 LAB — FERRITIN: Ferritin: 425 ng/mL — ABNORMAL HIGH (ref 11–307)

## 2020-09-09 LAB — TSH: TSH: 1.878 u[IU]/mL (ref 0.308–3.960)

## 2020-09-09 LAB — VITAMIN B12: Vitamin B-12: 220 pg/mL (ref 180–914)

## 2020-09-09 NOTE — Telephone Encounter (Signed)
Per result message Shannon Scott 09/09/20 - called and lvm of upcoming appointment

## 2020-09-11 ENCOUNTER — Other Ambulatory Visit: Payer: Self-pay | Admitting: *Deleted

## 2020-09-11 ENCOUNTER — Telehealth: Payer: Self-pay | Admitting: *Deleted

## 2020-09-11 ENCOUNTER — Ambulatory Visit: Payer: Medicare Other | Admitting: Adult Health

## 2020-09-11 MED ORDER — METHYLPREDNISOLONE 4 MG PO TBPK: ORAL_TABLET | ORAL | 0 refills | Status: DC

## 2020-09-11 NOTE — Telephone Encounter (Signed)
Call received from patient stating that she broke out in an "all over, red raised, itching rash" on 09/09/20 the day after her treatment and is concerned that she won't be able to get her iron infusion tomorrow. Dr. Marin Olp notified.  Call placed back to patient and patient notified per order of Dr. Marin Olp that a prescription for a Medrol Dose Pack will be sent in to her pharmacy and that it is ok for her to receive iron infusion tomorrow.  Pt appreciative of call back and has no further questions or concerns at this time.

## 2020-09-12 ENCOUNTER — Inpatient Hospital Stay: Payer: Medicare Other

## 2020-09-12 ENCOUNTER — Other Ambulatory Visit: Payer: Self-pay

## 2020-09-12 VITALS — BP 158/55 | HR 85 | Temp 99.0°F | Resp 18

## 2020-09-12 DIAGNOSIS — C541 Malignant neoplasm of endometrium: Secondary | ICD-10-CM | POA: Diagnosis not present

## 2020-09-12 DIAGNOSIS — D508 Other iron deficiency anemias: Secondary | ICD-10-CM

## 2020-09-12 MED ORDER — HEPARIN SOD (PORK) LOCK FLUSH 100 UNIT/ML IV SOLN
500.0000 [IU] | Freq: Once | INTRAVENOUS | Status: AC
  Administered 2020-09-12: 500 [IU] via INTRAVENOUS
  Filled 2020-09-12: qty 5

## 2020-09-12 MED ORDER — SODIUM CHLORIDE 0.9 % IV SOLN: 200.0000 mg | Freq: Once | INTRAVENOUS | Status: DC

## 2020-09-12 MED ORDER — SODIUM CHLORIDE 0.9% FLUSH
10.0000 mL | Freq: Once | INTRAVENOUS | Status: AC
Start: 2020-09-12 — End: 2020-09-12
  Administered 2020-09-12: 10 mL via INTRAVENOUS
  Filled 2020-09-12: qty 10

## 2020-09-12 MED ORDER — SODIUM CHLORIDE 0.9 % IV SOLN
Freq: Once | INTRAVENOUS | Status: AC
Start: 2020-09-12 — End: 2020-09-12
  Filled 2020-09-12: qty 250

## 2020-09-12 MED ORDER — SODIUM CHLORIDE 0.9 % IV SOLN
200.0000 mg | Freq: Once | INTRAVENOUS | Status: AC
  Administered 2020-09-12: 200 mg via INTRAVENOUS
  Filled 2020-09-12: qty 200

## 2020-09-12 NOTE — Patient Instructions (Signed)
Iron Deficiency Anemia, Adult Iron-deficiency anemia is when you have a low amount of red blood cells or hemoglobin. This happens because you have too little iron in your body. Hemoglobin carries oxygen to parts of the body. Anemia can cause your body to not get enough oxygen. It may or may not cause symptoms. Follow these instructions at home: Medicines  Take over-the-counter and prescription medicines only as told by your doctor. This includes iron pills (supplements) and vitamins.  If you cannot handle taking iron pills by mouth, ask your doctor about getting iron through: ? A vein (intravenously). ? A shot (injection) into a muscle.  Take iron pills when your stomach is empty. If you cannot handle this, take them with food.  Do not drink milk or take antacids at the same time as your iron pills.  To prevent trouble pooping (constipation), eat fiber or take medicine (stool softener) as told by your doctor. Eating and drinking   Talk with your doctor before changing the foods you eat. He or she may tell you to eat foods that have a lot of iron, such as: ? Liver. ? Lowfat (lean) beef. ? Breads and cereals that have iron added to them (fortified breads and cereals). ? Eggs. ? Dried fruit. ? Dark green, leafy vegetables.  Drink enough fluid to keep your pee (urine) clear or pale yellow.  Eat fresh fruits and vegetables that are high in vitamin C. They help your body to use iron. Foods with a lot of vitamin C include: ? Oranges. ? Peppers. ? Tomatoes. ? Mangoes. General instructions  Return to your normal activities as told by your doctor. Ask your doctor what activities are safe for you.  Keep yourself clean, and keep things clean around you (your surroundings). Anemia can make you get sick more easily.  Keep all follow-up visits as told by your doctor. This is important. Contact a doctor if:  You feel sick to your stomach (nauseous).  You throw up (vomit).  You feel  weak.  You are sweating for no clear reason.  You have trouble pooping, such as: ? Pooping (having a bowel movement) less than 3 times a week. ? Straining to poop. ? Having poop that is hard, dry, or larger than normal. ? Feeling full or bloated. ? Pain in the lower belly. ? Not feeling better after pooping. Get help right away if:  You pass out (faint). If this happens, do not drive yourself to the hospital. Call your local emergency services (911 in the [place]  You have chest pain.  You have shortness of breath that: ? Is very bad. ? Gets worse with physical activity.  You have a fast heartbeat.  You get light-headed when getting up from sitting or lying down. This information is not intended to replace advice given to you by your health care provider. Make sure you discuss any questions you have with your health care provider. Document Revised: 04/29/2017 Document Reviewed: 02/04/2016 Elsevier Patient Education  [place].   Zoledronic Acid injection (Hypercalcemia, Oncology) What is this medicine? ZOLEDRONIC ACID (ZOE le dron ik AS id) lowers the amount of calcium loss from bone. It is used to treat too much calcium in your blood from cancer. It is also used to prevent complications of cancer that has spread to the bone. This medicine may be used for other purposes; ask your health care provider or pharmacist if you have questions. COMMON BRAND NAME(S): Zometa What should I tell my  health care provider before I take this medicine? They need to know if you have any of these conditions:  aspirin-sensitive asthma  cancer, especially if you are receiving medicines used to treat cancer  dental disease or wear dentures  infection  kidney disease  receiving corticosteroids like dexamethasone or prednisone  an unusual or allergic reaction to zoledronic acid, other medicines, foods, dyes, or preservatives  pregnant or trying to get  pregnant  breast-feeding How should I use this medicine? This medicine is for infusion into a vein. It is given by a health care professional in a hospital or clinic setting. Talk to your pediatrician regarding the use of this medicine in children. Special care may be needed. Overdosage: If you think you have taken too much of this medicine contact a poison control center or emergency room at once. NOTE: This medicine is only for you. Do not share this medicine with others. What if I miss a dose? It is important not to miss your dose. Call your doctor or health care professional if you are unable to keep an appointment. What may interact with this medicine?  certain antibiotics given by injection  NSAIDs, medicines for pain and inflammation, like ibuprofen or naproxen  some diuretics like bumetanide, furosemide  teriparatide  thalidomide This list may not describe all possible interactions. Give your health care provider a list of all the medicines, herbs, non-prescription drugs, or dietary supplements you use. Also tell them if you smoke, drink alcohol, or use illegal drugs. Some items may interact with your medicine. What should I watch for while using this medicine? Visit your doctor or health care professional for regular checkups. It may be some time before you see the benefit from this medicine. Do not stop taking your medicine unless your doctor tells you to. Your doctor may order blood tests or other tests to see how you are doing. Women should inform their doctor if they wish to become pregnant or think they might be pregnant. There is a potential for serious side effects to an unborn child. Talk to your health care professional or pharmacist for more information. You should make sure that you get enough calcium and vitamin D while you are taking this medicine. Discuss the foods you eat and the vitamins you take with your health care professional. Some people who take this medicine  have severe bone, joint, and/or muscle pain. This medicine may also increase your risk for jaw problems or a broken thigh bone. Tell your doctor right away if you have severe pain in your jaw, bones, joints, or muscles. Tell your doctor if you have any pain that does not go away or that gets worse. Tell your dentist and dental surgeon that you are taking this medicine. You should not have major dental surgery while on this medicine. See your dentist to have a dental exam and fix any dental problems before starting this medicine. Take good care of your teeth while on this medicine. Make sure you see your dentist for regular follow-up appointments. What side effects may I notice from receiving this medicine? Side effects that you should report to your doctor or health care professional as soon as possible:  allergic reactions like skin rash, itching or hives, swelling of the face, lips, or tongue  anxiety, confusion, or depression  breathing problems  changes in vision  eye pain  feeling faint or lightheaded, falls  jaw pain, especially after dental work  mouth sores  muscle cramps, stiffness, or  weakness  redness, blistering, peeling or loosening of the skin, including inside the mouth  trouble passing urine or change in the amount of urine Side effects that usually do not require medical attention (report to your doctor or health care professional if they continue or are bothersome):  bone, joint, or muscle pain  constipation  diarrhea  fever  hair loss  irritation at site where injected  loss of appetite  nausea, vomiting  stomach upset  trouble sleeping  trouble swallowing  weak or tired This list may not describe all possible side effects. Call your doctor for medical advice about side effects. You may report side effects to FDA at 1-800-FDA-1088. Where should I keep my medicine? This drug is given in a hospital or clinic and will not be stored at home. NOTE:  This sheet is a summary. It may not cover all possible information. If you have questions about this medicine, talk to your doctor, pharmacist, or health care provider.  2020 Elsevier/Gold Standard (2013-10-13 14:19:39)    Pembrolizumab injection What is this medicine? PEMBROLIZUMAB (pem broe liz ue mab) is a monoclonal antibody. It is used to treat certain types of cancer. This medicine may be used for other purposes; ask your health care provider or pharmacist if you have questions. COMMON BRAND NAME(S): Keytruda What should I tell my health care provider before I take this medicine? They need to know if you have any of these conditions:  diabetes  immune system problems  inflammatory bowel disease  liver disease  lung or breathing disease  lupus  received or scheduled to receive an organ transplant or a stem-cell transplant that uses donor stem cells  an unusual or allergic reaction to pembrolizumab, other medicines, foods, dyes, or preservatives  pregnant or trying to get pregnant  breast-feeding How should I use this medicine? This medicine is for infusion into a vein. It is given by a health care professional in a hospital or clinic setting. A special MedGuide will be given to you before each treatment. Be sure to read this information carefully each time. Talk to your pediatrician regarding the use of this medicine in children. While this drug may be prescribed for children as young as 6 months for selected conditions, precautions do apply. Overdosage: If you think you have taken too much of this medicine contact a poison control center or emergency room at once. NOTE: This medicine is only for you. Do not share this medicine with others. What if I miss a dose? It is important not to miss your dose. Call your doctor or health care professional if you are unable to keep an appointment. What may interact with this medicine? Interactions have not been studied. Give your  health care provider a list of all the medicines, herbs, non-prescription drugs, or dietary supplements you use. Also tell them if you smoke, drink alcohol, or use illegal drugs. Some items may interact with your medicine. This list may not describe all possible interactions. Give your health care provider a list of all the medicines, herbs, non-prescription drugs, or dietary supplements you use. Also tell them if you smoke, drink alcohol, or use illegal drugs. Some items may interact with your medicine. What should I watch for while using this medicine? Your condition will be monitored carefully while you are receiving this medicine. You may need blood work done while you are taking this medicine. Do not become pregnant while taking this medicine or for 4 months after stopping it. Women should  inform their doctor if they wish to become pregnant or think they might be pregnant. There is a potential for serious side effects to an unborn child. Talk to your health care professional or pharmacist for more information. Do not breast-feed an infant while taking this medicine or for 4 months after the last dose. What side effects may I notice from receiving this medicine? Side effects that you should report to your doctor or health care professional as soon as possible:  allergic reactions like skin rash, itching or hives, swelling of the face, lips, or tongue  bloody or black, tarry  breathing problems  changes in vision  chest pain  chills  confusion  constipation  cough  diarrhea  dizziness or feeling faint or lightheaded  fast or irregular heartbeat  fever  flushing  joint pain  low blood counts - this medicine may decrease the number of white blood cells, red blood cells and platelets. You may be at increased risk for infections and bleeding.  muscle pain  muscle weakness  pain, tingling, numbness in the hands or feet  persistent headache  redness, blistering, peeling  or loosening of the skin, including inside the mouth  signs and symptoms of high blood sugar such as dizziness; dry mouth; dry skin; fruity breath; nausea; stomach pain; increased hunger or thirst; increased urination  signs and symptoms of kidney injury like trouble passing urine or change in the amount of urine  signs and symptoms of liver injury like dark urine, light-colored stools, loss of appetite, nausea, right upper belly pain, yellowing of the eyes or skin  sweating  swollen lymph nodes  weight loss Side effects that usually do not require medical attention (report to your doctor or health care professional if they continue or are bothersome):  decreased appetite  hair loss  muscle pain  tiredness This list may not describe all possible side effects. Call your doctor for medical advice about side effects. You may report side effects to FDA at 1-800-FDA-1088. Where should I keep my medicine? This drug is given in a hospital or clinic and will not be stored at home. NOTE: This sheet is a summary. It may not cover all possible information. If you have questions about this medicine, talk to your doctor, pharmacist, or health care provider.  2020 Elsevier/Gold Standard (2019-03-23 18:07:58)

## 2020-09-15 ENCOUNTER — Telehealth: Payer: Self-pay

## 2020-09-15 NOTE — Telephone Encounter (Signed)
Shannon Scott states that she has been using the Premarin cream  every night as directed for the past 20 days and she continues with redness,discomfort, and some itching in the labia bilaterally.  She wanted to know what she needed to do.

## 2020-09-16 NOTE — Telephone Encounter (Signed)
Told Ms Shannon Scott that Dr. Berline Lopes would like her to continue with the premarin cream and current direction of applying it every other night and record the date and time the cream was applied. Bring that information to the appointment 10-24-20 to be sure that she is remembering to apply cream.  Pt verbalized understanding and wrote instructions down for her to remember.

## 2020-10-01 ENCOUNTER — Other Ambulatory Visit: Payer: Self-pay

## 2020-10-01 ENCOUNTER — Ambulatory Visit (HOSPITAL_COMMUNITY)
Admission: RE | Admit: 2020-10-01 | Discharge: 2020-10-01 | Disposition: A | Discharge status: Home / Self Care | Payer: Medicare Other | Source: Ambulatory Visit | Attending: Family | Admitting: Family

## 2020-10-01 DIAGNOSIS — K435 Parastomal hernia without obstruction or  gangrene: Secondary | ICD-10-CM | POA: Diagnosis not present

## 2020-10-01 DIAGNOSIS — I2510 Atherosclerotic heart disease of native coronary artery without angina pectoris: Secondary | ICD-10-CM | POA: Diagnosis not present

## 2020-10-01 DIAGNOSIS — C541 Malignant neoplasm of endometrium: Secondary | ICD-10-CM | POA: Insufficient documentation

## 2020-10-01 DIAGNOSIS — S3219XA Other fracture of sacrum, initial encounter for closed fracture: Secondary | ICD-10-CM | POA: Diagnosis not present

## 2020-10-01 DIAGNOSIS — K8020 Calculus of gallbladder without cholecystitis without obstruction: Secondary | ICD-10-CM | POA: Diagnosis not present

## 2020-10-01 DIAGNOSIS — S32501A Unspecified fracture of right pubis, initial encounter for closed fracture: Secondary | ICD-10-CM | POA: Diagnosis not present

## 2020-10-01 DIAGNOSIS — I700 Atherosclerosis of aorta: Secondary | ICD-10-CM | POA: Diagnosis not present

## 2020-10-01 MED ORDER — IOHEXOL 300 MG/ML  SOLN
100.0000 mL | Freq: Once | INTRAMUSCULAR | Status: AC | PRN
  Administered 2020-10-01: 100 mL via INTRAVENOUS

## 2020-10-01 MED ORDER — SODIUM CHLORIDE (PF) 0.9 % IJ SOLN
INTRAMUSCULAR | Status: AC
  Filled 2020-10-01: qty 50

## 2020-10-03 ENCOUNTER — Telehealth: Payer: Self-pay

## 2020-10-03 NOTE — Telephone Encounter (Signed)
Called and informed patient of results per Dr.Ennever, she was very appreciative and denies any questions or concerns at this time.

## 2020-10-03 NOTE — Telephone Encounter (Signed)
-----   Message from Volanda Napoleon, MD sent at 10/03/2020  8:38 AM EDT ----- Call - there is no obvious cancer!!!  Great news!!  Happy Mother's Day1!  Laurey Arrow

## 2020-10-06 ENCOUNTER — Telehealth: Payer: Self-pay | Admitting: Internal Medicine

## 2020-10-06 NOTE — Telephone Encounter (Signed)
Received faxed from [facility] (PCP) regarding lipids - PCP recommended med changes of adding either zetia 79m daily OR vytorin 10/20mg daily  Of note, LDL was 358 in Oct 2021 Started on Repatha LDL was 198 in April 2022  If patient would like to try zetia again, OK per MD

## 2020-10-06 NOTE — Telephone Encounter (Signed)
Left message to call back MyChart message sent

## 2020-10-06 NOTE — Telephone Encounter (Signed)
Forward to DR [facility] Nurse

## 2020-10-06 NOTE — Telephone Encounter (Signed)
    Pt c/o medication issue:  1. Name of Medication:   Evolocumab (REPATHA SURECLICK) 201 MG/ML SOAJ    2. How are you currently taking this medication (dosage and times per day)?Inject 1 Dose into the skin every 14 (fourteen) days.  3. Are you having a reaction (difficulty breathing--STAT)?   4. What is your medication issue?  Pt said she already took 6 doses of repatha and wanted to check in with Dr. Debara Pickett for how long she will continue taking this medication, also she would like to speak with nurse to know result of her lipid blood work

## 2020-10-07 NOTE — Telephone Encounter (Signed)
Patient was returning phone call. Please advise

## 2020-10-07 NOTE — Telephone Encounter (Signed)
Left a message for the patient to call back.  

## 2020-10-14 ENCOUNTER — Other Ambulatory Visit: Payer: Self-pay | Admitting: Hematology & Oncology

## 2020-10-14 DIAGNOSIS — M549 Dorsalgia, unspecified: Secondary | ICD-10-CM

## 2020-10-14 DIAGNOSIS — C541 Malignant neoplasm of endometrium: Secondary | ICD-10-CM

## 2020-10-14 DIAGNOSIS — M792 Neuralgia and neuritis, unspecified: Secondary | ICD-10-CM

## 2020-10-14 DIAGNOSIS — G8929 Other chronic pain: Secondary | ICD-10-CM

## 2020-10-14 DIAGNOSIS — M545 Low back pain, unspecified: Secondary | ICD-10-CM

## 2020-10-16 DIAGNOSIS — Z933 Colostomy status: Secondary | ICD-10-CM | POA: Diagnosis not present

## 2020-10-16 DIAGNOSIS — K56609 Unspecified intestinal obstruction, unspecified as to partial versus complete obstruction: Secondary | ICD-10-CM | POA: Diagnosis not present

## 2020-10-16 NOTE — Telephone Encounter (Signed)
Pt is returning call to [place]. Please Advise

## 2020-10-16 NOTE — Telephone Encounter (Addendum)
Spoke with patient about lab results. She does not want to start an additional medication. Explained MD said her response to Repatha therapy is good (per MyChart message)

## 2020-10-16 NOTE — Telephone Encounter (Signed)
Patient returning your phone call.

## 2020-10-16 NOTE — Telephone Encounter (Signed)
Left message to call back  

## 2020-10-23 ENCOUNTER — Encounter: Payer: Self-pay | Admitting: Gynecologic Oncology

## 2020-10-24 ENCOUNTER — Encounter: Payer: Self-pay | Admitting: Gynecologic Oncology

## 2020-10-24 ENCOUNTER — Inpatient Hospital Stay: Payer: Medicare Other | Admitting: Gynecologic Oncology

## 2020-10-24 DIAGNOSIS — N9089 Other specified noninflammatory disorders of vulva and perineum: Secondary | ICD-10-CM

## 2020-10-24 DIAGNOSIS — C541 Malignant neoplasm of endometrium: Secondary | ICD-10-CM

## 2020-10-30 ENCOUNTER — Telehealth: Payer: Self-pay

## 2020-10-30 NOTE — Telephone Encounter (Signed)
Called pt per sch message, pts appts were sch at 8 weeks and should be at 12 per vanessa-called and left a vm with new appts and to view on mychart   Shannon Scott

## 2020-11-04 ENCOUNTER — Telehealth: Payer: Self-pay

## 2020-11-04 NOTE — Telephone Encounter (Signed)
Pt called in to r/s her appt from 7/9 to 7/11 as she needed a later appt time   Shannon Scott

## 2020-11-06 ENCOUNTER — Ambulatory Visit: Payer: Medicare Other

## 2020-11-06 ENCOUNTER — Ambulatory Visit: Payer: Medicare Other | Admitting: Hematology & Oncology

## 2020-11-06 ENCOUNTER — Other Ambulatory Visit: Payer: Medicare Other

## 2020-11-07 ENCOUNTER — Encounter: Payer: Self-pay | Admitting: Gynecologic Oncology

## 2020-11-07 ENCOUNTER — Inpatient Hospital Stay: Payer: Medicare Other | Attending: Hematology & Oncology | Admitting: Gynecologic Oncology

## 2020-11-07 ENCOUNTER — Other Ambulatory Visit: Payer: Self-pay

## 2020-11-07 VITALS — BP 173/65 | HR 74 | Temp 97.2°F | Resp 16 | Ht 61.0 in | Wt 144.4 lb

## 2020-11-07 DIAGNOSIS — Z9221 Personal history of antineoplastic chemotherapy: Secondary | ICD-10-CM | POA: Diagnosis not present

## 2020-11-07 DIAGNOSIS — Z951 Presence of aortocoronary bypass graft: Secondary | ICD-10-CM | POA: Diagnosis not present

## 2020-11-07 DIAGNOSIS — Z923 Personal history of irradiation: Secondary | ICD-10-CM | POA: Diagnosis not present

## 2020-11-07 DIAGNOSIS — Z7901 Long term (current) use of anticoagulants: Secondary | ICD-10-CM | POA: Diagnosis not present

## 2020-11-07 DIAGNOSIS — I4891 Unspecified atrial fibrillation: Secondary | ICD-10-CM | POA: Insufficient documentation

## 2020-11-07 DIAGNOSIS — Z79899 Other long term (current) drug therapy: Secondary | ICD-10-CM | POA: Diagnosis not present

## 2020-11-07 DIAGNOSIS — Z8673 Personal history of transient ischemic attack (TIA), and cerebral infarction without residual deficits: Secondary | ICD-10-CM | POA: Insufficient documentation

## 2020-11-07 DIAGNOSIS — Z90722 Acquired absence of ovaries, bilateral: Secondary | ICD-10-CM | POA: Diagnosis not present

## 2020-11-07 DIAGNOSIS — L292 Pruritus vulvae: Secondary | ICD-10-CM | POA: Diagnosis not present

## 2020-11-07 DIAGNOSIS — I10 Essential (primary) hypertension: Secondary | ICD-10-CM | POA: Insufficient documentation

## 2020-11-07 DIAGNOSIS — E785 Hyperlipidemia, unspecified: Secondary | ICD-10-CM | POA: Diagnosis not present

## 2020-11-07 DIAGNOSIS — Z7951 Long term (current) use of inhaled steroids: Secondary | ICD-10-CM | POA: Insufficient documentation

## 2020-11-07 DIAGNOSIS — N9089 Other specified noninflammatory disorders of vulva and perineum: Secondary | ICD-10-CM

## 2020-11-07 DIAGNOSIS — E039 Hypothyroidism, unspecified: Secondary | ICD-10-CM | POA: Insufficient documentation

## 2020-11-07 DIAGNOSIS — C541 Malignant neoplasm of endometrium: Secondary | ICD-10-CM

## 2020-11-07 DIAGNOSIS — Z90710 Acquired absence of both cervix and uterus: Secondary | ICD-10-CM | POA: Insufficient documentation

## 2020-11-07 DIAGNOSIS — I2510 Atherosclerotic heart disease of native coronary artery without angina pectoris: Secondary | ICD-10-CM | POA: Diagnosis not present

## 2020-11-07 MED ORDER — MOMETASONE FUROATE 0.1 % EX CREA: 1.0000 "application " | TOPICAL_CREAM | Freq: Every day | CUTANEOUS | 0 refills | Status: DC

## 2020-11-07 NOTE — Patient Instructions (Signed)
It was good to see you today.  I will see you back in 2 months.  We are going to stop using the estrogen cream and restart a steroid cream.  Work on a use a less potent or less strong cream that we were using before.  I want you to put this on initially every night on the outer part of the vagina where you are having pain and redness.  After a week or 2, please decrease to every other night.  You can use Vaseline on the outside part of the vagina as well as in your groins and near your rectum as needed to help with irritation.

## 2020-11-07 NOTE — Progress Notes (Signed)
Gynecologic Oncology Return Clinic Visit  11/07/20  Reason for Visit: Follow-up vulvar symptoms in the setting of endometrial cancer  Treatment History: Oncology History Overview Note  The patient began noticing postmenopausal bleeding in June, 2017. She saw her primary care physician who determined there was no blood in the urine, and she was then referred to Dr Benjie Karvonen who, on 11/21/15 performed a TVUS which showed a uterus measuring 6x2.7x3.3cm with normal ovaries. There was a thickened endometrium of 6.11m.    [place]  11/[place] Initial Diagnosis   [place] endometrial biopsy was performed on 11/21/15 which showed FIGO grade 1-2 endometrial cancer.  The pap from the same date was normal.   12/30/2015 Surgery   Robotic assisted total hysterectomy, BSO, pelvic and PA SLN biopsy. 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  Postoperative Diagnosis: same.  Surgery: Total robotic hysterectomy bilateral salpingo-oophorectomy, left pelvic, right para-aortic and bifurcation SLN removal  Operative findings:  1) Colon adherent to posterior uterus, left sidewall and appendix 2) 2 right para-aortic nodes for SLN (high and PA node) 3) Aortic bifurcation nodes 4) Left obturator node  Diagnosis 1. Lymph node, sentinel, biopsy, right obturator - ONE BENIGN LYMPH NODE (0/1). 2. Lymph node, sentinel, biopsy, right peri-aortic - ONE BENIGN LYMPH NODE (0/1). 3. Lymph node, sentinel, biopsy, right lower peri-aortic - ONE BENIGN LYMPH NODE (0/1). 4. Lymph node, sentinel, biopsy, aortic bifurcation - ONE BENIGN LYMPH NODE (0/1). 5. Lymph node, sentinel, biopsy, left obturator - ONE BENIGN LYMPH NODE (0/1). 6. Uterus +/- tubes/ovaries, neoplastic - ENDOMETRIAL ADENOCARCINOMA, 1.7 CM WITH SUPERFICIAL MYOMETRIAL INVASION. - MARGINS NOT INVOLVED. - CERVIX, BILATERAL OVARIES AND BILATERAL FALLOPIAN TUBES FREE OF TUMOR. Microscopic  Comment 6. ONCOLOGY TABLE-UTERUS, CARCINOMA OR CARCINOSARCOMA Specimen: Uterus with bilateral fallopian tubes and ovaries and sentinel lymph node biopsies. Procedure: Hysterectomy with sentinel lymph nodes. Lymph node sampling performed: Yes. Specimen integrity: Intact. Maximum tumor size: 1.7 cm Histologic type: Endometrioid with squamous differentiation. Grade: 2 Myometrial invasion: 0.3 cm where myometrium is 1 cm in thickness Cervical stromal involvement: No. Extent of involvement of other organs: None, identified. Lymph - vascular invasion: Not identified.    05/13/2016 Imaging   05/03/16 CT guided drainage: IMPRESSION: Successful CT guided aspiration of approximately 15 cc of serous, slightly cloudy / milky fluid from the residual collection within the left hemipelvis. All aspirated samples were sent to the laboratory for cytologic and gram stain analysis.  Cytology was negative   07/2017 Progression   Shannon Scott February 2019 presented to her primary physician with complaints of rectal bleeding.  She was scheduled for a colonoscopy and had taken the bowel prep when she presented with obstruction of her distal sigmoid colon.   08/18/2017 Imaging   maging 08/18/2017 The left ureter is dilated to the left hemipelvis. It becomes narrowed in the inflammatory pelvic mass. This is described below. Bladder is within normal limits.   Stomach/Bowel: Large hiatal hernia is unchanged. Stable prominent gastric folds within the hiatal hernia.   The colon is diffusely distended. Distal small bowel is distended. A transition point between dilated large bowel and decompressed large bowel occurs in the sigmoid colon. There is a heterogeneous ill-defined mass involving the sigmoid colon and left side of the pelvis which includes the colon wall, adjacent fat, and both fluid and gas elements. Findings are suspicious for a focal perforation with abscess formation. Underlying malignancy is a  strong consideration given the appearance and colon  obstruction. The mass also involves the left ureter causing an element of left ureteral dilatation worrisome for an element of left ureteral obstruction. The gas and fluid collection is very small measuring 1.8 x 1.2 cm on  image 109 of series 4. Overall mass size including the involved colon is 5.7 x 5.3 cm.    08/21/2017 Surgery   Shannon Scott underwent a diverting laparoscopic transverse loop colostomy and subsequent placement of a left ureteral stent.   08/2017 -  Radiation Therapy   She then received pelvic external beam radiation therapy    10/13/2017 - 11/17/2017 Chemotherapy   The patient had dexamethasone (DECADRON) 4 MG tablet, 8 mg, Oral, Daily, 1 of 1 cycle, Start date: --, End date: -- palonosetron (ALOXI) injection 0.25 mg, 0.25 mg, Intravenous,  Once, 6 of 6 cycles Administration: 0.25 mg (10/13/2017), 0.25 mg (10/20/2017), 0.25 mg (11/03/2017), 0.25 mg (11/10/2017), 0.25 mg (11/17/2017) CARBOplatin (PARAPLATIN) 150 mg in sodium chloride 0.9 % 100 mL chemo infusion, 150 mg (100 % of original dose 148 mg), Intravenous,  Once, 6 of 6 cycles Dose modification:   (original dose 148 mg, Cycle 1) Administration: 150 mg (10/13/2017), 150 mg (10/20/2017), 150 mg (11/03/2017), 150 mg (11/10/2017), 150 mg (11/17/2017) PACLitaxel (TAXOL) 132 mg in sodium chloride 0.9 % 250 mL chemo infusion (</= 81m/m2), 80 mg/m2 = 132 mg, Intravenous,  Once, 6 of 6 cycles Dose modification: 45 mg/m2 (original dose 80 mg/m2, Cycle 2, Reason: Other (see comments), Comment: Decreasing dose to 45 mg/m2 while undergoing XRT) Administration: 132 mg (10/13/2017), 72 mg (10/20/2017), 72 mg (11/03/2017), 72 mg (11/10/2017), 72 mg (11/17/2017)   for chemotherapy treatment.     01/18/2018 Imaging   notable for the presence of an IVC filter and 11 mm left internal iliac node that was previously 1.6 cm and a soft tissue mass in the left pelvic sidewall and now measuring 3.9 x 2.5  cm.   03/03/2018 Imaging   PET  demonstrates a hypermetabolic left pelvic sidewall mass, greater than 3 weeks after completion of radiotherapy.   03/16/2018 -  Chemotherapy    Patient is on Treatment Plan: COLORECTAL PEMBROLIZUMAB Q21D       04/2018 Miscellaneous    CA 125  Ref. Range 05/04/2018 12:08 05/25/2018 11:21 06/15/2018 13:30 10/10/2018 12:20 01/23/2019 11:35  Cancer Antigen (CA) 125 Latest Ref Range: 0.0 - 38.1 U/mL 11.2 10.9 10.2 10.9 9.2    10/05/2018 Imaging   PET  3. Continued positive response to therapy within the left pelvic sidewall mass, which demonstrates residual low level hypermetabolism that has mildly decreased. 4. Resolved mediastinal nodal hypermetabolism. Nearly resolved bilateral hilar nodal hypermetabolism. These findings could represent resolving reactive nodes versus response to therapy within metastatic nodes. 5. No metabolic evidence of new sites of metastatic disease.   09/27/2019 Imaging   CT C/A/P: IMPRESSION: 1. Since the PET of 10/05/2018, similar to slight decrease in left pelvic sidewall soft tissue fullness, without well-defined mass. 2. No findings of  new or progressive disease. 3. Chronic or recurrent left renal pelvic and proximal ureteric wall thickening/mucosal hyperenhancement. Nonspecific, especially in the setting of a prior stent. Cannot exclude ascending infection. Similarly, improved bladder wall and surrounding edema which could simply be treatment related. Correlate with urinalysis to exclude cystitis. 4. Small hiatal hernia. 5. Transverse colostomy with similar small bowel containing parastomal hernia. 6.  Aortic Atherosclerosis (ICD10-I70.0).   03/17/2020 Imaging   CT C/A/P: 1. No evidence of cervical cancer local recurrence or metastatic disease.  2. LEFT midline colostomy with peristomal hernia. Long segment small bowel enters the hernia sac without obstruction. 3. Chronic fractures sacral insufficiency fractures    06/03/2020 Imaging   CT C/A/P: 1. Status post hysterectomy and bilateral oophorectomy. No findings for recurrent tumor, regional lymphadenopathy or metastatic disease. 2. Stable loop colostomy with sizable parastomal hernia containing small bowel and colon. No obstructive findings. 3. Stable healed sacral insufficiency fractures and remote partially united fracture involving the right pubic symphysis. 4. Advanced atherosclerotic calcifications involving the thoracic and abdominal aorta and branch vessels including the coronary arteries. 5. Aortic atherosclerosis.   10/01/2020 Imaging   CT C/A/P: IMPRESSION: 1. Status post hysterectomy. No evidence of recurrent or metastatic disease in the chest, abdomen, or pelvis. 2. Status post diverting transverse loop colostomy with a large midline parastomal hernia containing multiple nonobstructed loops of transverse colon and small bowel. 3. Unchanged thickening of the urinary bladder wall, likely due to pelvic radiation. 4. Unchanged sclerotic insufficiency fractures of the bilateral sacral ala. Unchanged chronic fracture deformity of the right pubic symphysis. 5. Cholelithiasis. 6. Coronary artery disease.    Vulvar biopsy 03/13/21: A: Vulva, biopsy - Squamous epithelium with interface and epidermal mild acute inflammatory infiltrate. - Superficial and deep dermis with moderate lymphocytic inflammatory infiltrate. - No dysplasia or malignancy identified.  Interval History: Patient presents today for follow-up in the setting of her vulvar symptoms.  She notes overall feeling much better than she has in recent months.  She endorses a good appetite without nausea or emesis.  She reports continued urinary urgency but otherwise denies urinary symptoms and endorses regular bowel function.  She has been using her vaginal estrogen cream every other day and has noted some improvement but still feels as though her vulva is red, irritated, and  sore, especially when she sits down or stands up.  She also has some mild pruritus.  She is concerned that the area has spread to her groin and near her anus.  She denies any vaginal bleeding or discharge.  She is scheduled to see Dr. Marin Olp in July.    She continues to struggle with swelling and erythema of her left leg.  She is trying to be better about wearing compression socks although recently bought some new compression socks which are too tight around her calf.    Past Medical/Surgical History: Past Medical History:  Diagnosis Date   A-fib ([place]) 03/26/2020   Acute CVA (cerebrovascular accident) ([place]) 03/26/2020   Arthritis    Asthma    allergy induced   Bilateral carotid artery disease ([facility])    L carotid bruit   Bursitis    left hip   CAD (coronary artery disease)    Cancer ([place])    Dyslipidemia    intolerant to statins, welchol, niacin, zetia   Goals of care, counseling/discussion 10/11/2017   History of blood transfusion    History of nuclear stress test 04/24/2012   lexiscan; normal study   Hypertension    Hypothyroidism    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 ([place]) 10/11/2017   Postoperative nausea and vomiting 01/02/2016    Past Surgical History:  Procedure Laterality Date   ABDOMINAL HYSTERECTOMY     Carotid Doppler  02/2012   40-59% right int carotid artery stenosis; 60-79% L int carotid stenosis; L carotid bruit   CORONARY ARTERY BYPASS GRAFT  03/12/2004   LIMA to LAD, SVG to circumflex, SVG to PDA   CYSTOSCOPY W/ URETERAL STENT PLACEMENT Left  12/06/2017   Procedure: CYSTOSCOPY WITH LEFT URETERAL STENT EXCHANGE;  Surgeon: Irine Seal, MD;  Location: [facility];  Service: Urology;  Laterality: Left;   CYSTOSCOPY WITH STENT PLACEMENT Bilateral 08/21/2017   Procedure: CYSTOSCOPY WITH STENT PLACEMENT;  Surgeon: Ceasar Mons, MD;  Location: [facility];  Service: Urology;  Laterality: Bilateral;   IR FLUORO GUIDE PORT  INSERTION RIGHT  10/11/2017   IR GENERIC HISTORICAL  04/29/2016   IR RADIOLOGIST EVAL & MGMT 04/29/2016 Sandi Mariscal, MD [facility]   IR GENERIC HISTORICAL  05/12/2016   IR RADIOLOGIST EVAL & MGMT 05/12/2016 Sandi Mariscal, MD [facility]   IR IVC FILTER PLMT / S&I Burke Keels GUID/MOD SED  10/11/2017   IR US GUIDE VASC ACCESS RIGHT  10/11/2017   ROBOTIC ASSISTED TOTAL HYSTERECTOMY WITH BILATERAL SALPINGO OOPHERECTOMY Bilateral 12/30/2015   Procedure: XI ROBOTIC ASSISTED TOTAL HYSTERECTOMY WITH BILATERAL SALPINGO OOPHORECTOMY WITH SENTAL LYMPH NODE BIOPSY;  Surgeon: Nancy Marus, MD;  Location: [facility];  Service: Gynecology;  Laterality: Bilateral;   TONSILLECTOMY     TRANSTHORACIC ECHOCARDIOGRAM  04/2007   EF>55%; mild MR; mild-mod TR; mild pulm HTN; mild calcification of aortiv valve leaflets with mild valvular aortic stenosis   TUBAL LIGATION      Family History  Problem Relation Age of Onset   Hypertension Mother    Heart disease Mother        Died in her 25s   Stroke Father    Kidney disease Brother    Heart disease Brother        also HTN, hyperlipidemia   Heart attack Brother    Stroke Sister        x2   Hypertension Sister     Social History   Socioeconomic History   Marital status: Widowed    Spouse name: Not on file   Number of children: 2   Years of education: Not on file   Highest education level: Not on file  Occupational History    Employer: RETIRED  Tobacco Use   Smoking status: Never   Smokeless tobacco: Never  Vaping Use   Vaping Use: Never used  Substance and Sexual Activity   Alcohol use: No   Drug use: No   Sexual activity: Not Currently  Other Topics Concern   Not on file  Social History Narrative   Not on file   Social Determinants of Health   Financial Resource Strain: Not on file  Food Insecurity: Not on file  Transportation Needs: Not on file  Physical Activity: Not on file  Stress: Not on file  Social Connections: Not on file     Current Medications:  Current Outpatient Medications:    acetaminophen (TYLENOL) 500 MG tablet, , Disp: , Rfl:    apixaban (ELIQUIS) 5 MG TABS tablet, Take 1 tablet (5 mg total) by mouth 2 (two) times daily., Disp: 60 tablet, Rfl: 0   clobetasol ointment (TEMOVATE) 7.93 %, Apply 1 application topically 3 (three) times a week., Disp: 30 g, Rfl: 0   diltiazem (TIAZAC) 300 MG 24 hr capsule, TAKE 1 CAPSULE(300 MG) BY MOUTH DAILY (Patient taking differently: Take 300 mg by mouth daily.), Disp: 30 capsule, Rfl: 1   Evolocumab (REPATHA SURECLICK) 903 MG/ML SOAJ, Inject 1 Dose into the skin every 14 (fourteen) days., Disp: 2 mL, Rfl: 11   levothyroxine (SYNTHROID, LEVOTHROID) 50 MCG tablet, Take 1 tablet (50 mcg total) by mouth daily before breakfast., Disp: 30 tablet, Rfl: 1   mometasone (  ELOCON) 0.1 % cream, Apply 1 application topically daily. Use nightly for 1-2 weeks, then use every other night, Disp: 45 g, Rfl: 0   polyethylene glycol (MIRALAX / GLYCOLAX) 17 g packet, Take 17 g by mouth daily as needed for severe constipation., Disp: 14 each, Rfl: 0   traMADol (ULTRAM) 50 MG tablet, TAKE 2 TABLETS(100 MG) BY MOUTH TWICE DAILY, Disp: 120 tablet, Rfl: 0   conjugated estrogens (PREMARIN) vaginal cream, Place 1 Applicatorful vaginally 3 (three) times a week. Apply nightly for 2 weeks, then 3 times a week. (Patient not taking: Reported on 10/24/2020), Disp: 42.5 g, Rfl: 1   lidocaine (XYLOCAINE) 5 % ointment, Apply 1 application topically as needed. Up to TID for treatment of burning symptoms of the vulva (Patient not taking: No sig reported), Disp: 35.44 g, Rfl: 0   lidocaine-prilocaine (EMLA) cream, Apply a dime size to port-a-cath 1-2 hours prior to access. Cover with Saran wrap. (Patient not taking: Reported on 10/24/2020), Disp: 30 g, Rfl: 2   ondansetron (ZOFRAN-ODT) 8 MG disintegrating tablet, Take 8 mg by mouth. (Patient not taking: No sig reported), Disp: , Rfl:    valACYclovir (VALTREX) 1000  MG tablet, Take 1,000 mg by mouth daily as needed. (Patient not taking: No sig reported), Disp: , Rfl:  No current facility-administered medications for this visit.  Facility-Administered Medications Ordered in Other Visits:    sodium chloride flush (NS) 0.9 % injection 10 mL, 10 mL, Intracatheter, PRN, Volanda Napoleon, MD, 10 mL at 02/05/20 1440  Review of Systems: Denies appetite changes, fevers, chills, fatigue, unexplained weight changes. Denies hearing loss, neck lumps or masses, mouth sores, ringing in ears or voice changes. Denies cough or wheezing.  Denies shortness of breath. Denies chest pain or palpitations. Denies leg swelling. Denies abdominal distention, pain, blood in stools, constipation, diarrhea, nausea, vomiting, or early satiety. Denies pain with intercourse, dysuria, frequency, hematuria or incontinence. Denies joint pain, back pain or muscle pain/cramps. Denies itching, rash, or wounds. Denies dizziness, headaches, numbness or seizures. Denies swollen lymph nodes or glands, denies easy bruising or bleeding. Denies anxiety, depression, confusion, or decreased concentration.  Physical Exam: BP (!) 173/65 (BP Location: Left Arm, Patient Position: Sitting)   Pulse 74   Temp (!) 97.2 F (36.2 C) (Tympanic)   Resp 16   Ht 5' 1"  (1.549 m)   Wt 144 lb 6.4 oz (65.5 kg)   SpO2 98%   BMI 27.28 kg/m  General: Alert, oriented, no acute distress. HEENT: Normocephalic, atraumatic, sclera anicteric. Chest: Unlabored breathing on room air Extremities: Grossly normal range of motion.  Warm, well perfused.  Mild erythema and 2+ edema of the left lower leg. Lymphatics: No cervical, supraclavicular, or inguinal adenopathy. GU: Some improvement of vulvar erythema.  There are several spots of desquamation on bilateral inner labia.  No erythema extending into the groins nor around the anus however there is some very minor skin breakdown along the left groin crease.  No bleeding or  discharge.  Laboratory & Radiologic Studies: None new  Assessment & Plan: MYEASHA BALLOWE is a 80 y.o. woman with recurrent endometrial cancer, imaging continues to be negative for any evidence of disease.  She had been on pembrolizumab for maintenance after recurrence although has been off treatment since September.  She presents today for continued vulvar symptoms.   Patient had mild improvement of her symptoms using vaginal estrogen more regularly.  She still has some erythema and areas of desquamation.  She had significant  improvement on clobetasol last, but had significant skin changes related to this.  Today, I discussed with her a trial of a more mild topical steroid cream.  A prescription was sent to her pharmacy.  In between uses, I would like her to use an emollient or skin barrier, such as Vaseline.  All of these instructions were given to her in writing today.  I would like to see her back in 2 months with this new regimen.  If she does not have improvement on exam and of her symptoms, then I recommend repeat biopsy to make sure that we are not missing some other condition.  Given her chronic lymphedema, it is also possible that her vulvar symptoms are related to lymphedema.  If this is the case, it may be worth a trial of a course of steroids to see if she has improvement.   In terms of her endometrial cancer, she has follow-up with Dr. Marin Olp her medical oncologist next month.  She has been off pembrolizumab since September 2021.  She is doing well and imaging has continued to remain negative for recurrent disease (most recently in 09/2020).  34 minutes of total time was spent for this patient encounter, including preparation, face-to-face counseling with the patient and coordination of care, and documentation of the encounter.  Jeral Pinch, MD  Division of Gynecologic Oncology  Department of Obstetrics and Gynecology  [facility]

## 2020-11-12 DIAGNOSIS — Z933 Colostomy status: Secondary | ICD-10-CM | POA: Diagnosis not present

## 2020-11-12 DIAGNOSIS — K56609 Unspecified intestinal obstruction, unspecified as to partial versus complete obstruction: Secondary | ICD-10-CM | POA: Diagnosis not present

## 2020-11-17 ENCOUNTER — Telehealth: Payer: Self-pay | Admitting: Internal Medicine

## 2020-11-17 NOTE — Telephone Encounter (Signed)
Request Reference Number: [id number]. REPATHA SURE INJ 140MG/ML is approved through 05/30/2021

## 2020-11-17 NOTE — Telephone Encounter (Signed)
PA for repatha submitted via CMM (Key: [id number])

## 2020-11-18 ENCOUNTER — Ambulatory Visit: Payer: Medicare Other | Attending: Gynecologic Oncology | Admitting: Rehabilitation

## 2020-11-18 ENCOUNTER — Other Ambulatory Visit: Payer: Self-pay

## 2020-11-18 ENCOUNTER — Encounter: Payer: Self-pay | Admitting: Rehabilitation

## 2020-11-18 DIAGNOSIS — I890 Lymphedema, not elsewhere classified: Secondary | ICD-10-CM | POA: Diagnosis not present

## 2020-11-18 NOTE — Therapy (Signed)
[place], [place], [address] Phone: [phone]   Fax:  [phone]  Physical Therapy Evaluation  Patient Details  Name: Shannon Scott MRN: [id number] Date of Birth: 1941-03-02 Referring Provider (PT): [name], NP   Encounter Date: 11/18/2020   PT End of Session - 11/18/20 1228     Visit Number 1    Number of Visits 25    Date for PT Re-Evaluation 01/13/21    Authorization Type UHC    Progress Note Due on Visit 10    PT Start Time 1002    PT Stop Time 1058    PT Time Calculation (min) 56 min    Activity Tolerance Patient tolerated treatment well    Behavior During Therapy [facility] for tasks assessed/performed             Past Medical History:  Diagnosis Date   A-fib ([place]) 03/26/2020   Acute CVA (cerebrovascular accident) ([place]) 03/26/2020   Arthritis    Asthma    allergy induced   Bilateral carotid artery disease ([place])    L carotid bruit   Bursitis    left hip   CAD (coronary artery disease)    Cancer ([place])    Dyslipidemia    intolerant to statins, welchol, niacin, zetia   Goals of care, counseling/discussion 10/11/2017   History of blood transfusion    History of nuclear stress test 04/24/2012   lexiscan; normal study   Hypertension    Hypothyroidism    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 ([place]) 10/11/2017   Postoperative nausea and vomiting 01/02/2016    Past Surgical History:  Procedure Laterality Date   ABDOMINAL HYSTERECTOMY     Carotid Doppler  02/2012   40-59% right int carotid artery stenosis; 60-79% L int carotid stenosis; L carotid bruit   CORONARY ARTERY BYPASS GRAFT  03/12/2004   LIMA to LAD, SVG to circumflex, SVG to PDA   CYSTOSCOPY W/ URETERAL STENT PLACEMENT Left 12/06/2017   Procedure: CYSTOSCOPY WITH LEFT URETERAL STENT EXCHANGE;  Surgeon: Irine Seal, MD;  Location: [facility];  Service: Urology;  Laterality: Left;    CYSTOSCOPY WITH STENT PLACEMENT Bilateral 08/21/2017   Procedure: CYSTOSCOPY WITH STENT PLACEMENT;  Surgeon: Ceasar Mons, MD;  Location: [facility];  Service: Urology;  Laterality: Bilateral;   IR FLUORO GUIDE PORT INSERTION RIGHT  10/11/2017   IR GENERIC HISTORICAL  04/29/2016   IR RADIOLOGIST EVAL & MGMT 04/29/2016 Sandi Mariscal, MD [facility] INTERV RAD   IR GENERIC HISTORICAL  05/12/2016   IR RADIOLOGIST EVAL & MGMT 05/12/2016 Sandi Mariscal, MD [facility] INTERV RAD   IR IVC FILTER PLMT / S&I Burke Keels GUID/MOD SED  10/11/2017   IR US GUIDE VASC ACCESS RIGHT  10/11/2017   ROBOTIC ASSISTED TOTAL HYSTERECTOMY WITH BILATERAL SALPINGO OOPHERECTOMY Bilateral 12/30/2015   Procedure: XI ROBOTIC ASSISTED TOTAL HYSTERECTOMY WITH BILATERAL SALPINGO OOPHORECTOMY WITH SENTAL LYMPH NODE BIOPSY;  Surgeon: Nancy Marus, MD;  Location[facility];  Service: Gynecology;  Laterality: Bilateral;   TONSILLECTOMY     TRANSTHORACIC ECHOCARDIOGRAM  04/2007   EF>55%; mild MR; mild-mod TR; mild pulm HTN; mild calcification of aortiv valve leaflets with mild valvular aortic stenosis   TUBAL LIGATION      There were no vitals filed for this visit.    Subjective Assessment - 11/18/20 1004     Subjective Pt's daughter reports she has not really followed through with compression since last visits here when  lymphedema was starting around 2 years ago.    Pertinent History Pt with history of locally advanced/recurrent endometrial carcinoma with past radiation x 5 weeks and taxol/carboplatinum for 6 cycles.  Currently on pembrolizumab IV every 3 weeks/Zometa IV every 6-8 weeks but on a break for now. History of kidney stent placement 12/06/17, colostomy in 2019, total hysterectomy in 2017 with 5 lymph nodes removed. Recent PET scan showing severe bilateral sacral insufficiency fractures, Rt pubic symphysis fracture, healing 8-9-10th rib fractures, and metastatic lymph nodes. Last EF 65-70%, recent negative DVT scan for the LEs.  Reports good  heart and kidneys. Osteoporosis.  Stroke since here last 02/2020 affecting mainly just the speech and a pretty bad case of shingles over the left leg. has colostomy bag    Limitations Walking    Patient Stated Goals what to do for the swelling    Currently in Pain? No/denies   not in the leg; has has chronic back pain               [facility] PT Assessment - 11/18/20 0001       Assessment   Medical Diagnosis uterine carcinoma    Referring Provider (PT) [name], NP    Onset Date/Surgical Date 06/01/15    Hand Dominance Right    Prior Therapy yes      Precautions   Precaution Comments lymphedema Lt LE, colostomy bag, history of pelvic/abdominal radiation      Balance Screen   Has the patient fallen in the past 6 months No    Has the patient had a decrease in activity level because of a fear of falling?  Yes    Is the patient reluctant to leave their home because of a fear of falling?  No      Home Ecologist residence    Living Arrangements Alone    Additional Comments children visit and live nearby      Prior Function   Level of Independence Needs assistance with homemaking    Vocation Retired      Associate Professor   Overall Cognitive Status Within Functional Limits for tasks assessed      Observation/Other Assessments   Observations left LE much larger than last visits with mild redness, hyper keratosis, and 3 spots that looks like lymphorrea althogh pt reports no leaking               LYMPHEDEMA/ONCOLOGY QUESTIONNAIRE - 11/18/20 0001       Type   Cancer Type uterine carcinoma      Surgeries   Number Lymph Nodes Removed 5      Treatment   Active Radiation Treatment No    Past Radiation Treatment Yes      What other symptoms do you have   Are you Having Heaviness or Tightness Yes    Are you having Pain No    Are you having pitting edema Yes    Is it Hard or Difficult finding clothes that fit Yes      Lymphedema Assessments    Lymphedema Assessments Lower extremities      Right Lower Extremity Lymphedema   30 cm Proximal to Suprapatella 54 cm    20 cm Proximal to Suprapatella 45.6 cm    10 cm Proximal to Suprapatella 43.5 cm    30 cm Proximal to Floor at Lateral Plantar Foot 37 cm    20 cm Proximal to Floor at Lateral Plantar Foot 30.2 1  10 cm Proximal to Floor at Lateral Malleoli 25.7 cm    5 cm Proximal to 1st MTP Joint 20.1 cm    Across MTP Joint 20 cm    Around Proximal Great Toe 7.2 cm      Left Lower Extremity Lymphedema   20 cm Proximal to Suprapatella 59.3 cm    10 cm Proximal to Suprapatella 54.5 cm    At Midpatella/Popliteal Crease 52 cm    30 cm Proximal to Floor at Lateral Plantar Foot 46.3 cm    20 cm Proximal to Floor at Lateral Plantar Foot 41.6 cm    10 cm Proximal to Floor at Lateral Malleoli 34.1 cm    Across MTP Joint 22 cm    Around Proximal Great Toe 8.6 cm    Other with tension    Other pitting edema from foot to knee +3                     Objective measurements completed on examination: See above findings.       [facility] Adult PT Treatment/Exercise - 11/18/20 0001       Manual Therapy   Manual Therapy Edema management;Compression Bandaging    Edema Management discussed CDT with pt and daughter; stages of reduction, end goals, and requirements of patient and family.  Pt and daughter willing to particiapate in CDT with daughter willing to learn bandaging    Compression Bandaging pt will return soon to start CDT with education for daughter, but today a mild knee high bandage was applied to help with skin breakdown.  lotion, tg soft from foot to knee, toes 1-4, 1 8cm roman sandal and another 8cm from ankle to knee also including heel                    PT Education - 11/18/20 1227     Education Details when to remove bandage, how to remove and bring back, CDT and treatment options    Person(s) Educated Patient;Child(ren)    Methods Explanation     Comprehension Verbalized understanding;Need further instruction                 PT Long Term Goals - 11/18/20 1239       PT LONG TERM GOAL #1   Title Pt will reduce the left leg by at least 4cm in the lower leg to decrease risk of skin breakdown    Baseline 10cm: 34,   20cm: 41,   30:46    Time 8    Period Weeks    Status New      PT LONG TERM GOAL #2   Title Pt will be ind with final compression for maintenance of reduction    Time 8    Period Weeks    Status New      PT LONG TERM GOAL #3   Title Pt will be ind with self MLD or flexitouch    Time 8    Period Weeks    Status New                    Plan - 11/18/20 1234     Personal Factors and Comorbidities Age;Fitness;Past/Current Experience;Comorbidity 2;Transportation    Comorbidities radiation history, lymph node resection    Examination-Activity Limitations Locomotion Level;Hygiene/Grooming    Examination-Participation Restrictions Meal Prep;Community Activity;Driving    Stability/Clinical Decision Making Evolving/Moderate complexity    Clinical Decision Making Moderate    Rehab Potential  Good    PT Frequency 3x / week    PT Duration 8 weeks    PT Treatment/Interventions ADLs/Self Care Home Management;Therapeutic exercise;Therapeutic activities;Patient/family education;Manual lymph drainage;Manual techniques;Taping;Vasopneumatic Device    PT Next Visit Plan how was small bandage to lower leg?, start CDT to the left leg from toes to groin with bandaging and MLD (no abdominals due to radiation history and colostomy bag, may want to include inguinal anastamosis due to reports of upper groin edema), start education on bandaging for the daughter, discuss skin care, etc.    Consulted and Agree with Plan of Care Patient;Family member/caregiver             Patient will benefit from skilled therapeutic intervention in order to improve the following deficits and impairments:  Decreased knowledge of use of  DME, Decreased range of motion, Difficulty walking, Increased edema  Visit Diagnosis: Lymphedema, not elsewhere classified     Problem List Patient Active Problem List   Diagnosis Date Noted   Acute CVA (cerebrovascular accident) ([place]) 03/28/2020   Stroke ([place]) 03/26/2020   Expressive aphasia 03/26/2020   Acute vaginitis 01/03/2020   Acute vulvitis 01/03/2020   Lower extremity edema 11/09/2018   Back pain 11/09/2018   IDA (iron deficiency anemia) 10/21/2017   Abdominal mass    Deep vein thrombosis (DVT) of left lower extremity (HCC)    Pelvic mass    Acute blood loss anemia    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 ([place]) 10/11/2017   Goals of care, counseling/discussion 10/11/2017   Rectal bleeding 10/03/2017   Normocytic anemia 10/03/2017   Left hip pain 10/03/2017   Generalized weakness 10/03/2017   Colostomy status ([place]) 08/30/2017   Ureteral stent displacement, subsequent encounter 08/30/2017   Atrial fibrillation with RVR ([place]) 08/30/2017   Hypophosphatemia 08/30/2017   Hypomagnesemia 08/30/2017   Acute blood loss as cause of postoperative anemia 08/30/2017   Chronic anemia 08/30/2017   Left leg swelling 08/30/2017   GERD (gastroesophageal reflux disease) 08/30/2017   Atrial fibrillation, chronic (HCC)    Bowel perforation ([place]) 08/18/2017   Colonic mass 08/18/2017   Ureteral dilatation 08/18/2017   Intractable right heel pain 10/07/2016   Plantar fasciitis of right foot 10/07/2016   Postoperative nausea and vomiting 01/02/2016   Hyponatremia 01/01/2016   Acute hypokalemia 01/01/2016   Endometrial cancer ([place]) 12/30/2015   Familial hypercholesteremia 07/28/2015   Aortic valve stenosis 08/02/2014   S/P CABG x 3 01/13/2013   Palpitations 01/13/2013   Medication intolerance 01/13/2013   Chronic pain 01/13/2013   TIA (transient ischemic attack) 01/13/2013   Carotid stenosis 01/13/2013   Hypothyroid 10/05/2010    Essential hypertension 10/05/2010   Gastroesophagitis 10/05/2010   Atrophic gastritis 10/05/2010    Stark Bray 11/18/2020, 12:41 PM  [place], [place], [address] Phone: [phone]   Fax:  [phone]  Name: Shannon Scott MRN: [id number] Date of Birth: 01-31-1941

## 2020-11-19 ENCOUNTER — Ambulatory Visit: Payer: Medicare Other

## 2020-11-19 DIAGNOSIS — I890 Lymphedema, not elsewhere classified: Secondary | ICD-10-CM | POA: Diagnosis not present

## 2020-11-19 NOTE — Patient Instructions (Signed)
PLEASE KEEP YOUR BANDAGES ON AS LONG AS POSSIBLE TO GET THE BEST SWELLING REDUCTION. Should your bandages become uncomfortable or feel too tight, follow these steps: Elevate your extremity higher than your heart.  Try to move your arm or leg joints against the firmness of the bandage to help with moving the fluid and allow the bandages to loosen a bit.  If the bandaging is still is too tight, it is ok to carefully remove the top layer.  There will still be more layers under it that can provide compression to your extremity. Finally, if you STILL have significant pain after trying these steps, it is ok to take the bandage off.  Check your skin carefully for any signs of irritation  PLEASE bring ALL bandage materials back to your next appointment as we will reuse what we can TAKE CARE OF YOUR BANDAGES SO THEY WILL LAST LONGER AND STAY IN BETTER CONDITION Washing bandages:  Wash periodically using a mild detergent in warm water.  Do not use fabric softener or bleach.  Place bandages in a mesh lingerie bag or in a tied off pillow case and use the gentle cycle of the washing machine or hand wash. If you hand wash, you may want to put them in the spin cycle of your washer to get the extra water out, but make sure you put them in a mesh bag first. Do not wring or stretch them while they are wet.  Drying bandages: Lay the bandages out smoothly on a towel away from direct sunlight or heating sources that can damage the fabric. Rolling bandages in a towel and gently squeezing the towel to remove excess water before laying them out can speed up the process.  If you use a drying rack, place a towel on top of the rack to lay the bandages on.  If they hang down to dry, they fabric could be stretched out and the bandage will lose its compression.   Or, keep bandages in the mesh bag and dry them in the dryer on the low or no heat cycle. Rolling bandages: Please roll your bandages after drying them so they are ready for  your next treatment. If they are rolled too loose, they will be difficult to apply.  If rolled too tight, they can get stretched out.   TAKE CARE OF YOUR SKIN Apply a low pH moisturizing lotion to your skin daily Avoid scratching your skin Treat skin irritations quickly  Know the 5 warning signs of infection: redness, pain, warmth to touch, fever and increased swelling.  Call your physician immediately if you notice any of these signs of a possible infection.

## 2020-11-19 NOTE — Therapy (Signed)
[place], [place], [address] Phone: [phone]   Fax:  [phone]  Physical Therapy Treatment  Patient Details  Name: Shannon Scott MRN: [id number] Date of Birth: [birth year] Referring Provider (PT): [name], NP   Encounter Date: 11/19/2020   PT End of Session - 11/19/20 1717     Visit Number 2    Number of Visits 25    Date for PT Re-Evaluation 01/13/21    Authorization Type UHC    Progress Note Due on Visit 10    PT Start Time 4585    PT Stop Time 9292    PT Time Calculation (min) 59 min    Activity Tolerance Patient tolerated treatment well    Behavior During Therapy [facility] for tasks assessed/performed             Past Medical History:  Diagnosis Date   A-fib ([place]) 03/26/2020   Acute CVA (cerebrovascular accident) ([place]) 03/26/2020   Arthritis    Asthma    allergy induced   Bilateral carotid artery disease ([place])    L carotid bruit   Bursitis    left hip   CAD (coronary artery disease)    Cancer ([place])    Dyslipidemia    intolerant to statins, welchol, niacin, zetia   Goals of care, counseling/discussion 10/11/2017   History of blood transfusion    History of nuclear stress test 04/24/2012   lexiscan; normal study   Hypertension    Hypothyroidism    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 ([place]) 10/11/2017   Postoperative nausea and vomiting 01/02/2016    Past Surgical History:  Procedure Laterality Date   ABDOMINAL HYSTERECTOMY     Carotid Doppler  02/2012   40-59% right int carotid artery stenosis; 60-79% L int carotid stenosis; L carotid bruit   CORONARY ARTERY BYPASS GRAFT  03/12/2004   LIMA to LAD, SVG to circumflex, SVG to PDA   CYSTOSCOPY W/ URETERAL STENT PLACEMENT Left 12/06/2017   Procedure: CYSTOSCOPY WITH LEFT URETERAL STENT EXCHANGE;  Surgeon: Irine Seal, MD;  [facility];  Service: Urology;  Laterality: Left;    CYSTOSCOPY WITH STENT PLACEMENT Bilateral 08/21/2017   Procedure: CYSTOSCOPY WITH STENT PLACEMENT;  Surgeon: Ceasar Mons, MD;  [facility];  Service: Urology;  Laterality: Bilateral;   IR FLUORO GUIDE PORT INSERTION RIGHT  10/11/2017   IR GENERIC HISTORICAL  04/29/2016   IR RADIOLOGIST EVAL & MGMT 04/29/2016 Sandi Mariscal, MD [facility] INTERV RAD   IR GENERIC HISTORICAL  05/12/2016   IR RADIOLOGIST EVAL & MGMT 05/12/2016 Sandi Mariscal, MD [facility] INTERV RAD   IR IVC FILTER PLMT / S&I Burke Keels GUID/MOD SED  10/11/2017   IR US GUIDE VASC ACCESS RIGHT  10/11/2017   ROBOTIC ASSISTED TOTAL HYSTERECTOMY WITH BILATERAL SALPINGO OOPHERECTOMY Bilateral 12/30/2015   Procedure: XI ROBOTIC ASSISTED TOTAL HYSTERECTOMY WITH BILATERAL SALPINGO OOPHORECTOMY WITH SENTAL LYMPH NODE BIOPSY;  Surgeon: Nancy Marus, MD;  [facility];  Service: Gynecology;  Laterality: Bilateral;   TONSILLECTOMY     TRANSTHORACIC ECHOCARDIOGRAM  04/2007   EF>55%; mild MR; mild-mod TR; mild pulm HTN; mild calcification of aortiv valve leaflets with mild valvular aortic stenosis   TUBAL LIGATION      There were no vitals filed for this visit.   Subjective Assessment - 11/19/20 1714     Subjective Took bandages off today because it started to slide and I feel like it has reduced some.  The toe wraps weren't really comfortable for her.    Pertinent History Pt with history of locally advanced/recurrent endometrial carcinoma with past radiation x 5 weeks and taxol/carboplatinum for 6 cycles.  Currently on pembrolizumab IV every 3 weeks/Zometa IV every 6-8 weeks but on a break for now. History of kidney stent placement 12/06/17, colostomy in 2019, total hysterectomy in 2017 with 5 lymph nodes removed. Recent PET scan showing severe bilateral sacral insufficiency fractures, Rt pubic symphysis fracture, healing 8-9-10th rib fractures, and metastatic lymph nodes. Last EF 65-70%, recent negative DVT scan for the LEs.  Reports good heart and  kidneys. Osteoporosis.  Stroke since here last 02/2020 affecting mainly just the speech and a pretty bad case of shingles over the left leg. has colostomy bag    Limitations Walking    Patient Stated Goals what to do for the swelling    Currently in Pain? No/denies    Pain Score 0-No pain                               [facility] Adult PT Treatment/Exercise - 11/19/20 0001       Manual Therapy   Manual Therapy Edema management;Compression Bandaging    Edema Management Pts daughter was instructed in compression bandaging as noted below and practiced each wrap 1-2 times.  After practicing wrap removed and replaced by PT    Compression Bandaging Pt. sitting on table with large bolster under leg lotion applied, TG soft foot to knee, toes wrapped 1-3 with elastomull, 8 cm Figure 8's, 8 cm heel locks and circles up leg, 12 cm above ankle to Knee with TG soft pulled over top.                    PT Education - 11/19/20 1716     Education Details Pts daughter Levada Dy  was educated in toe wraps, 8 cm foot wrap with figure 8, and 8 cm heel locks circling up leg and 1 12 cm wrap andkle to knee    Person(s) Educated Patient;Child(ren)    Methods Demonstration;Handout    Comprehension Returned demonstration;Verbal cues required;Need further instruction                 PT Long Term Goals - 11/18/20 1239       PT LONG TERM GOAL #1   Title Pt will reduce the left leg by at least 4cm in the lower leg to decrease risk of skin breakdown    Baseline 10cm: 34,   20cm: 41,   30:46    Time 8    Period Weeks    Status New      PT LONG TERM GOAL #2   Title Pt will be ind with final compression for maintenance of reduction    Time 8    Period Weeks    Status New      PT LONG TERM GOAL #3   Title Pt will be ind with self MLD or flexitouch    Time 8    Period Weeks    Status New                   Plan - 11/19/20 1718     Clinical Impression Statement Pt  s daughter was educated in compression bandaging as indicated in flow sheet.  She practiced all x 1-2 times and all were redone by therapist before they left. Showed how  to check pressure of wraps, and capillary refill.  Discussed removing wraps in painful or causing numbness/tingling.  Gave handout for care of bandages, skin care etc. Pts foot was reduced from initial eval with minimal reduction at leg per pt. Wrapped only to the knee today and added an additional wrap.  if pt tolerates OK wrap to thigh next visit.    Personal Factors and Comorbidities Age;Fitness;Past/Current Experience;Comorbidity 2;Transportation    Comorbidities radiation history, lymph node resection    Examination-Activity Limitations Locomotion Level;Hygiene/Grooming    Examination-Participation Restrictions Meal Prep;Community Activity;Driving    Stability/Clinical Decision Making Evolving/Moderate complexity    Rehab Potential Good    PT Frequency 3x / week    PT Duration 8 weeks    PT Treatment/Interventions ADLs/Self Care Home Management;Therapeutic exercise;Therapeutic activities;Patient/family education;Manual lymph drainage;Manual techniques;Taping;Vasopneumatic Device    PT Next Visit Plan how was  bandage to lower leg?, start CDT to the left leg from toes to groin with bandaging and MLD (no abdominals due to radiation history and colostomy bag, may want to include inguinal anastamosis due to reports of upper groin edema), start education on bandaging for the daughter, discuss skin care, etc.    Consulted and Agree with Plan of Care Family member/caregiver;Patient             Patient will benefit from skilled therapeutic intervention in order to improve the following deficits and impairments:  Decreased knowledge of use of DME, Decreased range of motion, Difficulty walking, Increased edema  Visit Diagnosis: Lymphedema, not elsewhere classified     Problem List Patient Active Problem List   Diagnosis Date  Noted   Acute CVA (cerebrovascular accident) ([place]) 03/28/2020   Stroke ([place]) 03/26/2020   Expressive aphasia 03/26/2020   Acute vaginitis 01/03/2020   Acute vulvitis 01/03/2020   Lower extremity edema 11/09/2018   Back pain 11/09/2018   IDA (iron deficiency anemia) 10/21/2017   Abdominal mass    Deep vein thrombosis (DVT) of left lower extremity ([place])    Pelvic mass    Acute blood loss anemia    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 ([place]) 10/11/2017   Goals of care, counseling/discussion 10/11/2017   Rectal bleeding 10/03/2017   Normocytic anemia 10/03/2017   Left hip pain 10/03/2017   Generalized weakness 10/03/2017   Colostomy status ([place]) 08/30/2017   Ureteral stent displacement, subsequent encounter 08/30/2017   Atrial fibrillation with RVR ([place]) 08/30/2017   Hypophosphatemia 08/30/2017   Hypomagnesemia 08/30/2017   Acute blood loss as cause of postoperative anemia 08/30/2017   Chronic anemia 08/30/2017   Left leg swelling 08/30/2017   GERD (gastroesophageal reflux disease) 08/30/2017   Atrial fibrillation, chronic (HCC)    Bowel perforation ([place]) 08/18/2017   Colonic mass 08/18/2017   Ureteral dilatation 08/18/2017   Intractable right heel pain 10/07/2016   Plantar fasciitis of right foot 10/07/2016   Postoperative nausea and vomiting 01/02/2016   Hyponatremia 01/01/2016   Acute hypokalemia 01/01/2016   Endometrial cancer ([place]) 12/30/2015   Familial hypercholesteremia 07/28/2015   Aortic valve stenosis 08/02/2014   S/P CABG x 3 01/13/2013   Palpitations 01/13/2013   Medication intolerance 01/13/2013   Chronic pain 01/13/2013   TIA (transient ischemic attack) 01/13/2013   Carotid stenosis 01/13/2013   Hypothyroid 10/05/2010   Essential hypertension 10/05/2010   Gastroesophagitis 10/05/2010   Atrophic gastritis 10/05/2010    Shannon Scott 11/19/2020, 5:29 PM  [place],  [place], [address] Phone: [phone]   Fax:  [phone]  Name: Shannon Scott MRN: [id number] Date of Birth: 01-26-1941 Cheral Almas, PT 11/19/20 5:30 PM

## 2020-11-27 ENCOUNTER — Encounter: Payer: Medicare Other | Admitting: Physical Therapy

## 2020-11-28 ENCOUNTER — Encounter: Payer: Medicare Other | Admitting: Rehabilitation

## 2020-12-04 ENCOUNTER — Ambulatory Visit: Payer: Medicare Other | Attending: Gynecologic Oncology

## 2020-12-04 ENCOUNTER — Other Ambulatory Visit: Payer: Self-pay

## 2020-12-04 DIAGNOSIS — I890 Lymphedema, not elsewhere classified: Secondary | ICD-10-CM | POA: Diagnosis not present

## 2020-12-04 NOTE — Therapy (Signed)
[place], [place], [address] Phone: [phone]   Fax:  [phone]  Physical Therapy Treatment  Patient Details  Name: ANUSHKA HARTINGER MRN: [id number] Date of Birth: [birth year] Referring Provider (PT): [name], NP   Encounter Date: 12/04/2020   PT End of Session - 12/04/20 1603     Visit Number 3    Number of Visits 25    Date for PT Re-Evaluation 01/13/21    Authorization Type UHC    Progress Note Due on Visit 10    PT Start Time 0034    PT Stop Time 1556    PT Time Calculation (min) 51 min    Activity Tolerance Patient tolerated treatment well    Behavior During Therapy [facility] for tasks assessed/performed             Past Medical History:  Diagnosis Date   A-fib ([place]) 03/26/2020   Acute CVA (cerebrovascular accident) ([place]) 03/26/2020   Arthritis    Asthma    allergy induced   Bilateral carotid artery disease ([place])    L carotid bruit   Bursitis    left hip   CAD (coronary artery disease)    Cancer ([place])    Dyslipidemia    intolerant to statins, welchol, niacin, zetia   Goals of care, counseling/discussion 10/11/2017   History of blood transfusion    History of nuclear stress test 04/24/2012   lexiscan; normal study   Hypertension    Hypothyroidism    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 ([place]) 10/11/2017   Postoperative nausea and vomiting 01/02/2016    Past Surgical History:  Procedure Laterality Date   ABDOMINAL HYSTERECTOMY     Carotid Doppler  02/2012   40-59% right int carotid artery stenosis; 60-79% L int carotid stenosis; L carotid bruit   CORONARY ARTERY BYPASS GRAFT  03/12/2004   LIMA to LAD, SVG to circumflex, SVG to PDA   CYSTOSCOPY W/ URETERAL STENT PLACEMENT Left 12/06/2017   Procedure: CYSTOSCOPY WITH LEFT URETERAL STENT EXCHANGE;  Surgeon: Irine Seal, MD;  Location: [facility];  Service: Urology;  Laterality: Left;    CYSTOSCOPY WITH STENT PLACEMENT Bilateral 08/21/2017   Procedure: CYSTOSCOPY WITH STENT PLACEMENT;  Surgeon: Ceasar Mons, MD;  Location: [facility];  Service: Urology;  Laterality: Bilateral;   IR FLUORO GUIDE PORT INSERTION RIGHT  10/11/2017   IR GENERIC HISTORICAL  04/29/2016   IR RADIOLOGIST EVAL & MGMT 04/29/2016 Sandi Mariscal, MD [facility] INTERV RAD   IR GENERIC HISTORICAL  05/12/2016   IR RADIOLOGIST EVAL & MGMT 05/12/2016 Sandi Mariscal, MD [facility] INTERV RAD   IR IVC FILTER PLMT / S&I Burke Keels GUID/MOD SED  10/11/2017   IR US GUIDE VASC ACCESS RIGHT  10/11/2017   ROBOTIC ASSISTED TOTAL HYSTERECTOMY WITH BILATERAL SALPINGO OOPHERECTOMY Bilateral 12/30/2015   Procedure: XI ROBOTIC ASSISTED TOTAL HYSTERECTOMY WITH BILATERAL SALPINGO OOPHORECTOMY WITH SENTAL LYMPH NODE BIOPSY;  Surgeon: Nancy Marus, MD;  Location: [facility];  Service: Gynecology;  Laterality: Bilateral;   TONSILLECTOMY     TRANSTHORACIC ECHOCARDIOGRAM  04/2007   EF>55%; mild MR; mild-mod TR; mild pulm HTN; mild calcification of aortiv valve leaflets with mild valvular aortic stenosis   TUBAL LIGATION      There were no vitals filed for this visit.   Subjective Assessment - 12/04/20 1504     Subjective Daughter tried rewrapping my leg but not every day because she had to work. Wraps did help  alot when they were wrapped, but sometimes she would get it too tight at my foot and I had to take it off.    Pertinent History Pt with history of locally advanced/recurrent endometrial carcinoma with past radiation x 5 weeks and taxol/carboplatinum for 6 cycles.  Currently on pembrolizumab IV every 3 weeks/Zometa IV every 6-8 weeks but on a break for now. History of kidney stent placement 12/06/17, colostomy in 2019, total hysterectomy in 2017 with 5 lymph nodes removed. Recent PET scan showing severe bilateral sacral insufficiency fractures, Rt pubic symphysis fracture, healing 8-9-10th rib fractures, and metastatic lymph nodes. Last EF 65-70%,  recent negative DVT scan for the LEs.  Reports good heart and kidneys. Osteoporosis.  Stroke since here last 02/2020 affecting mainly just the speech and a pretty bad case of shingles over the left leg. has colostomy bag    Patient Stated Goals what to do for the swelling    Currently in Pain? No/denies    Pain Score 0-No pain                               [facility] Adult PT Treatment/Exercise - 12/04/20 0001       Manual Therapy   Manual Therapy Edema management;Manual Lymphatic Drainage (MLD);Compression Bandaging    Manual Lymphatic Drainage (MLD) short neck, left axillary and inguinal LN's, left inguino-axillary pathway, Left lateral thigh, medial to lateral and lateral thigh reworking pathway, then left lower leg anterior and posterior reworking pathway and left foot retracing steps and ending with LN's    Compression Bandaging pt supine;large TG soft applied to thigh and medium to lower leg and foot. 2 12 cm artiflex 1 foot to knee and 1 knee to thigh. elastomull to toes 1-3, 8cm wrap figure 8's, 2nd 8 cm heel locks and then to mid calf, 12 cm ankle to knee and additional 12 cm to thigh.                         PT Long Term Goals - 11/18/20 1239       PT LONG TERM GOAL #1   Title Pt will reduce the left leg by at least 4cm in the lower leg to decrease risk of skin breakdown    Baseline 10cm: 34,   20cm: 41,   30:46    Time 8    Period Weeks    Status New      PT LONG TERM GOAL #2   Title Pt will be ind with final compression for maintenance of reduction    Time 8    Period Weeks    Status New      PT LONG TERM GOAL #3   Title Pt will be ind with self MLD or flexitouch    Time 8    Period Weeks    Status New                   Plan - 12/04/20 1604     Clinical Impression Statement pt forgot wraps and another set was given today.  She came alone so I was not able to review wrapping with daughter.  Wraps have not been on  consistently but when on do reduce edema.  initiated MLD today and wrapped pt today to the thigh.  Explained that thigh wrap may get loose, but pt  will still have wrap to knee intact and  can try rewrapping the upper wrap herself if needed.  Wrap was comfortable for pt but was advised to remove wrap if having any discomfort or if they slide down.    Personal Factors and Comorbidities Age;Fitness;Past/Current Experience;Comorbidity 2;Transportation    Comorbidities radiation history, lymph node resection    Examination-Activity Limitations Locomotion Level;Hygiene/Grooming    Examination-Participation Restrictions Meal Prep;Community Activity;Driving    Stability/Clinical Decision Making Evolving/Moderate complexity    Rehab Potential Good    PT Frequency 3x / week    PT Duration 8 weeks    PT Treatment/Interventions ADLs/Self Care Home Management;Therapeutic exercise;Therapeutic activities;Patient/family education;Manual lymph drainage;Manual techniques;Taping;Vasopneumatic Device    PT Next Visit Plan how was  bandage to lower leg?, start CDT to the left leg from toes to groin with bandaging and MLD (no abdominals due to radiation history and colostomy bag, may want to include inguinal anastamosis due to reports of upper groin edema), start education on bandaging for the daughter, discuss skin care, etc.    Consulted and Agree with Plan of Care Patient             Patient will benefit from skilled therapeutic intervention in order to improve the following deficits and impairments:  Decreased knowledge of use of DME, Decreased range of motion, Difficulty walking, Increased edema  Visit Diagnosis: Lymphedema, not elsewhere classified     Problem List Patient Active Problem List   Diagnosis Date Noted   Acute CVA (cerebrovascular accident) ([place]) 03/28/2020   Stroke ([place]) 03/26/2020   Expressive aphasia 03/26/2020   Acute vaginitis 01/03/2020   Acute vulvitis 01/03/2020   Lower  extremity edema 11/09/2018   Back pain 11/09/2018   IDA (iron deficiency anemia) 10/21/2017   Abdominal mass    Deep vein thrombosis (DVT) of left lower extremity (HCC)    Pelvic mass    Acute blood loss anemia    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 ([place]) 10/11/2017   Goals of care, counseling/discussion 10/11/2017   Rectal bleeding 10/03/2017   Normocytic anemia 10/03/2017   Left hip pain 10/03/2017   Generalized weakness 10/03/2017   Colostomy status ([place]) 08/30/2017   Ureteral stent displacement, subsequent encounter 08/30/2017   Atrial fibrillation with RVR ([place]) 08/30/2017   Hypophosphatemia 08/30/2017   Hypomagnesemia 08/30/2017   Acute blood loss as cause of postoperative anemia 08/30/2017   Chronic anemia 08/30/2017   Left leg swelling 08/30/2017   GERD (gastroesophageal reflux disease) 08/30/2017   Atrial fibrillation, chronic (HCC)    Bowel perforation ([place]) 08/18/2017   Colonic mass 08/18/2017   Ureteral dilatation 08/18/2017   Intractable right heel pain 10/07/2016   Plantar fasciitis of right foot 10/07/2016   Postoperative nausea and vomiting 01/02/2016   Hyponatremia 01/01/2016   Acute hypokalemia 01/01/2016   Endometrial cancer ([place]) 12/30/2015   Familial hypercholesteremia 07/28/2015   Aortic valve stenosis 08/02/2014   S/P CABG x 3 01/13/2013   Palpitations 01/13/2013   Medication intolerance 01/13/2013   Chronic pain 01/13/2013   TIA (transient ischemic attack) 01/13/2013   Carotid stenosis 01/13/2013   Hypothyroid 10/05/2010   Essential hypertension 10/05/2010   Gastroesophagitis 10/05/2010   Atrophic gastritis 10/05/2010    Elsie Ra Mekaylah Klich 12/04/2020, 5:24 PM  [facility] [address] [place], [place], [address] Phone: [phone]   Fax:  [phone]  Name: JONNETTE NUON MRN: [id number] Date of Birth: 04-13-1941 Cheral Almas, PT 12/04/20  5:25 PM

## 2020-12-05 ENCOUNTER — Ambulatory Visit: Payer: Medicare Other | Admitting: Hematology & Oncology

## 2020-12-05 ENCOUNTER — Ambulatory Visit: Payer: Medicare Other

## 2020-12-05 ENCOUNTER — Other Ambulatory Visit: Payer: Medicare Other

## 2020-12-08 ENCOUNTER — Other Ambulatory Visit: Payer: Medicare Other

## 2020-12-08 ENCOUNTER — Ambulatory Visit: Payer: Medicare Other

## 2020-12-08 ENCOUNTER — Ambulatory Visit: Payer: Medicare Other | Admitting: Hematology & Oncology

## 2020-12-10 ENCOUNTER — Inpatient Hospital Stay: Payer: Medicare Other

## 2020-12-10 ENCOUNTER — Inpatient Hospital Stay: Payer: Medicare Other | Attending: Hematology & Oncology

## 2020-12-10 ENCOUNTER — Encounter: Payer: Self-pay | Admitting: Hematology & Oncology

## 2020-12-10 ENCOUNTER — Inpatient Hospital Stay (HOSPITAL_BASED_OUTPATIENT_CLINIC_OR_DEPARTMENT_OTHER): Payer: Medicare Other | Admitting: Hematology & Oncology

## 2020-12-10 ENCOUNTER — Other Ambulatory Visit: Payer: Self-pay

## 2020-12-10 VITALS — Wt 147.0 lb

## 2020-12-10 VITALS — BP 141/52 | HR 66 | Temp 98.3°F | Resp 16

## 2020-12-10 DIAGNOSIS — E079 Disorder of thyroid, unspecified: Secondary | ICD-10-CM | POA: Insufficient documentation

## 2020-12-10 DIAGNOSIS — Z933 Colostomy status: Secondary | ICD-10-CM | POA: Insufficient documentation

## 2020-12-10 DIAGNOSIS — C539 Malignant neoplasm of cervix uteri, unspecified: Secondary | ICD-10-CM

## 2020-12-10 DIAGNOSIS — I890 Lymphedema, not elsewhere classified: Secondary | ICD-10-CM | POA: Diagnosis not present

## 2020-12-10 DIAGNOSIS — Z8542 Personal history of malignant neoplasm of other parts of uterus: Secondary | ICD-10-CM | POA: Insufficient documentation

## 2020-12-10 DIAGNOSIS — Z79899 Other long term (current) drug therapy: Secondary | ICD-10-CM | POA: Insufficient documentation

## 2020-12-10 DIAGNOSIS — E032 Hypothyroidism due to medicaments and other exogenous substances: Secondary | ICD-10-CM

## 2020-12-10 DIAGNOSIS — D508 Other iron deficiency anemias: Secondary | ICD-10-CM

## 2020-12-10 DIAGNOSIS — Z95828 Presence of other vascular implants and grafts: Secondary | ICD-10-CM

## 2020-12-10 DIAGNOSIS — Z9221 Personal history of antineoplastic chemotherapy: Secondary | ICD-10-CM | POA: Insufficient documentation

## 2020-12-10 DIAGNOSIS — Z923 Personal history of irradiation: Secondary | ICD-10-CM | POA: Insufficient documentation

## 2020-12-10 DIAGNOSIS — D509 Iron deficiency anemia, unspecified: Secondary | ICD-10-CM | POA: Insufficient documentation

## 2020-12-10 DIAGNOSIS — C799 Secondary malignant neoplasm of unspecified site: Secondary | ICD-10-CM

## 2020-12-10 DIAGNOSIS — C541 Malignant neoplasm of endometrium: Secondary | ICD-10-CM

## 2020-12-10 LAB — CMP (CANCER CENTER ONLY)
ALT: 7 U/L (ref 0–44)
AST: 13 U/L — ABNORMAL LOW (ref 15–41)
Albumin: 4.1 g/dL (ref 3.5–5.0)
Alkaline Phosphatase: 80 U/L (ref 38–126)
Anion gap: 6 (ref 5–15)
BUN: 17 mg/dL (ref 8–23)
CO2: 29 mmol/L (ref 22–32)
Calcium: 9.9 mg/dL (ref 8.9–10.3)
Chloride: 102 mmol/L (ref 98–111)
Creatinine: 0.86 mg/dL (ref 0.44–1.00)
GFR, Estimated: >60 mL/min (ref >60)
Glucose, Bld: 105 mg/dL — ABNORMAL HIGH (ref 70–99)
Potassium: 3.9 mmol/L (ref 3.5–5.1)
Sodium: 137 mmol/L (ref 135–145)
Total Bilirubin: 0.3 mg/dL (ref 0.3–1.2)
Total Protein: 6.7 g/dL (ref 6.5–8.1)

## 2020-12-10 LAB — CBC WITH DIFFERENTIAL (CANCER CENTER ONLY)
Abs Immature Granulocytes: 0.02 10*3/uL (ref 0.00–0.07)
Basophils Absolute: 0 10*3/uL (ref 0.0–0.1)
Basophils Relative: 0 %
Eosinophils Absolute: 0 10*3/uL (ref 0.0–0.5)
Eosinophils Relative: 1 %
HCT: 35.6 % — ABNORMAL LOW (ref 36.0–46.0)
Hemoglobin: 11.7 g/dL — ABNORMAL LOW (ref 12.0–15.0)
Immature Granulocytes: 0 %
Lymphocytes Relative: 15 %
Lymphs Abs: 0.8 10*3/uL (ref 0.7–4.0)
MCH: 29.9 pg (ref 26.0–34.0)
MCHC: 32.9 g/dL (ref 30.0–36.0)
MCV: 91 fL (ref 80.0–100.0)
Monocytes Absolute: 0.4 10*3/uL (ref 0.1–1.0)
Monocytes Relative: 7 %
Neutro Abs: 4 10*3/uL (ref 1.7–7.7)
Neutrophils Relative %: 77 %
Platelet Count: 270 10*3/uL (ref 150–400)
RBC: 3.91 MIL/uL (ref 3.87–5.11)
RDW: 13.6 % (ref 11.5–15.5)
WBC Count: 5.3 10*3/uL (ref 4.0–10.5)
nRBC: 0 % (ref 0.0–0.2)

## 2020-12-10 LAB — RETICULOCYTES
Immature Retic Fract: 3.4 % (ref 2.3–15.9)
RBC.: 3.99 MIL/uL (ref 3.87–5.11)
Retic Count, Absolute: 30.7 10*3/uL (ref 19.0–186.0)
Retic Ct Pct: 0.8 % (ref 0.4–3.1)

## 2020-12-10 LAB — LACTATE DEHYDROGENASE: LDH: 122 U/L (ref 98–192)

## 2020-12-10 MED ORDER — SODIUM CHLORIDE 0.9% FLUSH
10.0000 mL | Freq: Once | INTRAVENOUS | Status: AC
Start: 2020-12-10 — End: 2020-12-10
  Administered 2020-12-10: 10 mL via INTRAVENOUS
  Filled 2020-12-10: qty 10

## 2020-12-10 MED ORDER — HEPARIN SOD (PORK) LOCK FLUSH 100 UNIT/ML IV SOLN
500.0000 [IU] | Freq: Once | INTRAVENOUS | Status: AC
  Administered 2020-12-10: 500 [IU] via INTRAVENOUS
  Filled 2020-12-10: qty 5

## 2020-12-10 NOTE — Patient Instructions (Signed)

## 2020-12-10 NOTE — Progress Notes (Signed)
No treatment per MD

## 2020-12-10 NOTE — Addendum Note (Signed)
Addended by: Lucile Crater on: 12/10/2020 01:17 PM   Modules accepted: Orders

## 2020-12-10 NOTE — Progress Notes (Signed)
Hematology and Oncology Follow Up Visit  Shannon Scott [id number] Apr 14, 1941 80 y.o. 12/10/2020   Principle Diagnosis:  Locally advanced/recurrent endometrial carcinoma undifferentiated --  TMB (HIGH) / MSI HIGH    Past Therapy: Radiation therapy for 5 -5 1/2 weeks - s/p 20 fractions Taxol/carboplatinum q 7 days -- s/p cycle 6    Current Therapy:        Pembrolizumab 400 mg q 12 weeks, s/p cycle 20 (originally started on 01/23/2019 at 400 mg IV q 6 wk)  Zometa 4 mg IV q 3 months -- next dose on 02/2021 IV iron as indicated   Interim History:  Shannon Scott is here today for follow-up. She really does not want to have any treatment right now.  She wants to take a break from treatment.  I think this would be okay.  She had a CT scan done back in May which did not show any evidence of obvious metastatic disease.  I really cannot argue with this.  We treat her every 3 months.  She has get her Zometa.  We will have to hold off on this until we see her back.  She has a colostomy that is working well.  There is no problems with back pain.  This seems to be doing a lot better.  She does not complain of any kind of rash in the "female" area.    She has had no fever.  There is no cough or shortness of breath.  She has had no bleeding.  There is no headache.  She does have chronic lymphedema in her legs.  The left leg is larger than the right leg.  I told her that she has to be very cautious with respect to any kind of cuts or scrapes that she may have.  She is at significant risk for cellulitis which would really be a problem for her.  Overall, her performance status is ECOG 1.     Medications:  Allergies as of 12/10/2020       Reactions   Statins Other (See Comments)   Myalgias and memory problems   Ciprofloxacin Itching   Splotchy redness with itching during IV infusion localized to arm.        Medication List        Accurate as of December 10, 2020  1:04 PM. If you have  any questions, ask your nurse or doctor.          STOP taking these medications    ondansetron 8 MG disintegrating tablet Commonly known as: ZOFRAN-ODT Stopped by: Volanda Napoleon, MD   valACYclovir 1000 MG tablet Commonly known as: VALTREX Stopped by: Volanda Napoleon, MD       TAKE these medications    acetaminophen 500 MG tablet Commonly known as: TYLENOL   apixaban 5 MG Tabs tablet Commonly known as: ELIQUIS Take 1 tablet (5 mg total) by mouth 2 (two) times daily.   clobetasol ointment 0.05 % Commonly known as: TEMOVATE Apply 1 application topically 3 (three) times a week.   conjugated estrogens vaginal cream Commonly known as: PREMARIN Place 1 Applicatorful vaginally 3 (three) times a week. Apply nightly for 2 weeks, then 3 times a week.   diltiazem 300 MG 24 hr capsule Commonly known as: TIAZAC TAKE 1 CAPSULE(300 MG) BY MOUTH DAILY What changed: See the new instructions.   ketoconazole 2 % cream Commonly known as: NIZORAL Apply topically 2 (two) times daily.   levothyroxine 50 MCG tablet Commonly  known as: SYNTHROID Take 1 tablet (50 mcg total) by mouth daily before breakfast.   lidocaine 5 % ointment Commonly known as: XYLOCAINE Apply 1 application topically as needed. Up to TID for treatment of burning symptoms of the vulva   lidocaine-prilocaine cream Commonly known as: EMLA Apply a dime size to port-a-cath 1-2 hours prior to access. Cover with Saran wrap.   mometasone 0.1 % cream Commonly known as: Elocon Apply 1 application topically daily. Use nightly for 1-2 weeks, then use every other night   polyethylene glycol 17 g packet Commonly known as: MIRALAX / GLYCOLAX Take 17 g by mouth daily as needed for severe constipation.   Repatha SureClick 656 MG/ML Soaj Generic drug: Evolocumab Inject 1 Dose into the skin every 14 (fourteen) days.   traMADol 50 MG tablet Commonly known as: ULTRAM TAKE 2 TABLETS(100 MG) BY MOUTH TWICE DAILY         Allergies:  Allergies  Allergen Reactions   Statins Other (See Comments)    Myalgias and memory problems   Ciprofloxacin Itching    Splotchy redness with itching during IV infusion localized to arm.    Past Medical History, Surgical history, Social history, and Family History were reviewed and updated.  Review of Systems: Review of Systems  Constitutional: Negative.   HENT: Negative.    Eyes: Negative.   Respiratory: Negative.    Cardiovascular:  Positive for leg swelling.  Gastrointestinal: Negative.   Genitourinary: Negative.   Musculoskeletal:  Positive for back pain.  Skin: Negative.   Neurological: Negative.   Endo/Heme/Allergies: Negative.   Psychiatric/Behavioral: Negative.      Physical Exam:  weight is 147 lb (66.7 kg).   Wt Readings from Last 3 Encounters:  12/10/20 147 lb (66.7 kg)  11/07/20 144 lb 6.4 oz (65.5 kg)  09/08/20 138 lb (62.6 kg)    Physical Exam Vitals reviewed.  HENT:     Head: Normocephalic and atraumatic.  Eyes:     Pupils: Pupils are equal, round, and reactive to light.  Cardiovascular:     Rate and Rhythm: Normal rate and regular rhythm.     Heart sounds: Normal heart sounds.     Comments: Cardiac exam shows a regular rate and rhythm.  She has a 1/6 systolic ejection murmur.   Pulmonary:     Effort: Pulmonary effort is normal.     Breath sounds: Normal breath sounds.  Abdominal:     General: Bowel sounds are normal.     Palpations: Abdomen is soft.     Comments: Abdominal exam shows a soft abdomen.  She has a colostomy that is intact.  She has no fluid wave.  There is no obvious abdominal mass.  There is no palpable liver or spleen tip.  Musculoskeletal:        General: No tenderness or deformity. Normal range of motion.     Cervical back: Normal range of motion.     Comments: She has significant lymphedema in her legs.  She has probably 3+ in the left leg and 2+ in the right leg.  I do not see any erythema.  She has pitting  edema.  She has decent range of motion of her joints.  Lymphadenopathy:     Cervical: No cervical adenopathy.  Skin:    General: Skin is warm and dry.     Findings: No erythema or rash.  Neurological:     Mental Status: She is alert and oriented to person, place, and time.  Psychiatric:        Behavior: Behavior normal.        Thought Content: Thought content normal.        Judgment: Judgment normal.     Lab Results  Component Value Date   WBC 5.3 12/10/2020   HGB 11.7 (L) 12/10/2020   HCT 35.6 (L) 12/10/2020   MCV 91.0 12/10/2020   PLT 270 12/10/2020   Lab Results  Component Value Date   FERRITIN 425 (H) 09/08/2020   IRON 36 (L) 09/08/2020   TIBC 230 (L) 09/08/2020   UIBC 194 09/08/2020   IRONPCTSAT 16 (L) 09/08/2020   Lab Results  Component Value Date   RETICCTPCT 0.8 12/10/2020   RBC 3.99 12/10/2020   No results found for: KPAFRELGTCHN, LAMBDASER, KAPLAMBRATIO No results found for: IGGSERUM, IGA, IGMSERUM No results found for: Ronnald Ramp, A1GS, A2GS, Tillman Sers, SPEI   Chemistry      Component Value Date/Time   NA 137 12/10/2020 1146   NA 136 (A) 09/08/2017 0000   K 3.9 12/10/2020 1146   CL 102 12/10/2020 1146   CO2 29 12/10/2020 1146   BUN 17 12/10/2020 1146   BUN 8 09/08/2017 0000   CREATININE 0.86 12/10/2020 1146   CREATININE 0.69 08/02/2016 1519   GLU 111 09/08/2017 0000      Component Value Date/Time   CALCIUM 9.9 12/10/2020 1146   ALKPHOS 80 12/10/2020 1146   AST 13 (L) 12/10/2020 1146   ALT 7 12/10/2020 1146   BILITOT 0.3 12/10/2020 1146       Impression and Plan: Ms. Weikel is a very pleasant 80 yo caucasian female with recurrent undifferentiated endometrial carcinoma.   She is on immunotherapy.  She has a high MSI.  We will go ahead and hold on her immunotherapy right now.  I will plan to see her back in about 2-3 months.  At some point, we will have to follow-up with scans to make sure that she is still  in remission.  When we see her back, we will do Zometa.   Volanda Napoleon, MD 7/13/20221:04 PM

## 2020-12-11 ENCOUNTER — Encounter: Payer: Medicare Other | Admitting: Rehabilitation

## 2020-12-11 LAB — TSH: TSH: 2.325 u[IU]/mL (ref 0.308–3.960)

## 2020-12-11 LAB — FERRITIN: Ferritin: 411 ng/mL — ABNORMAL HIGH (ref 11–307)

## 2020-12-11 LAB — IRON AND TIBC
Iron: 34 ug/dL — ABNORMAL LOW (ref 41–142)
Saturation Ratios: 17 % — ABNORMAL LOW (ref 21–57)
TIBC: 193 ug/dL — ABNORMAL LOW (ref 236–444)
UIBC: 159 ug/dL (ref 120–384)

## 2020-12-12 ENCOUNTER — Encounter: Payer: Medicare Other | Admitting: Rehabilitation

## 2020-12-15 ENCOUNTER — Telehealth: Payer: Self-pay | Admitting: *Deleted

## 2020-12-15 ENCOUNTER — Telehealth: Payer: Self-pay

## 2020-12-15 NOTE — Telephone Encounter (Addendum)
-----   Message from Volanda Napoleon, MD sent at 12/14/2020  8:20 PM EDT ----- Called patient to let her know the iron is a little low.   She needs a dose of IV iron!!  LMAM to cal [place] to schedule

## 2020-12-16 ENCOUNTER — Other Ambulatory Visit: Payer: Self-pay

## 2020-12-16 ENCOUNTER — Inpatient Hospital Stay: Payer: Medicare Other

## 2020-12-16 ENCOUNTER — Telehealth: Payer: Self-pay | Admitting: Cardiology

## 2020-12-16 VITALS — BP 149/50 | HR 65 | Temp 98.3°F | Resp 16

## 2020-12-16 DIAGNOSIS — Z933 Colostomy status: Secondary | ICD-10-CM | POA: Diagnosis not present

## 2020-12-16 DIAGNOSIS — D508 Other iron deficiency anemias: Secondary | ICD-10-CM

## 2020-12-16 DIAGNOSIS — Z79899 Other long term (current) drug therapy: Secondary | ICD-10-CM | POA: Diagnosis not present

## 2020-12-16 DIAGNOSIS — Z8542 Personal history of malignant neoplasm of other parts of uterus: Secondary | ICD-10-CM | POA: Diagnosis not present

## 2020-12-16 DIAGNOSIS — Z9221 Personal history of antineoplastic chemotherapy: Secondary | ICD-10-CM | POA: Diagnosis not present

## 2020-12-16 DIAGNOSIS — Z923 Personal history of irradiation: Secondary | ICD-10-CM | POA: Diagnosis not present

## 2020-12-16 DIAGNOSIS — D509 Iron deficiency anemia, unspecified: Secondary | ICD-10-CM | POA: Diagnosis not present

## 2020-12-16 DIAGNOSIS — E079 Disorder of thyroid, unspecified: Secondary | ICD-10-CM | POA: Diagnosis not present

## 2020-12-16 DIAGNOSIS — I890 Lymphedema, not elsewhere classified: Secondary | ICD-10-CM | POA: Diagnosis not present

## 2020-12-16 MED ORDER — HEPARIN SOD (PORK) LOCK FLUSH 100 UNIT/ML IV SOLN
500.0000 [IU] | Freq: Once | INTRAVENOUS | Status: AC | PRN
Start: 2020-12-16 — End: 2020-12-16
  Administered 2020-12-16: 500 [IU]
  Filled 2020-12-16: qty 5

## 2020-12-16 MED ORDER — SODIUM CHLORIDE 0.9% FLUSH
10.0000 mL | Freq: Once | INTRAVENOUS | Status: AC | PRN
  Administered 2020-12-16: 10 mL
  Filled 2020-12-16: qty 10

## 2020-12-16 MED ORDER — SODIUM CHLORIDE 0.9 % IV SOLN
Freq: Once | INTRAVENOUS | Status: AC
  Filled 2020-12-16: qty 250

## 2020-12-16 MED ORDER — SODIUM CHLORIDE 0.9 % IV SOLN
200.0000 mg | Freq: Once | INTRAVENOUS | Status: AC
  Administered 2020-12-16: 200 mg via INTRAVENOUS
  Filled 2020-12-16: qty 200

## 2020-12-16 NOTE — Patient Instructions (Signed)

## 2020-12-16 NOTE — Telephone Encounter (Signed)
New message:     Patient says she had her Lipid blood work at her cancer doctor last week. She wants to know if she can send those results to Dr Debara Pickett?

## 2020-12-16 NOTE — Telephone Encounter (Signed)
Spoke to patient, aware lab results are in Epic, no lipid panel drawn.     Last Lipid 08/2020-no repeat needed at this time.   Patient aware.

## 2020-12-18 ENCOUNTER — Emergency Department (HOSPITAL_COMMUNITY): Payer: Medicare Other

## 2020-12-18 ENCOUNTER — Other Ambulatory Visit: Payer: Self-pay

## 2020-12-18 ENCOUNTER — Emergency Department (HOSPITAL_COMMUNITY)
Admission: EM | Admit: 2020-12-18 | Discharge: 2020-12-18 | Disposition: A | Discharge status: Home / Self Care | Payer: Medicare Other | Attending: Emergency Medicine | Admitting: Emergency Medicine

## 2020-12-18 ENCOUNTER — Ambulatory Visit: Payer: Medicare Other | Admitting: Physical Therapy

## 2020-12-18 DIAGNOSIS — R531 Weakness: Secondary | ICD-10-CM | POA: Diagnosis not present

## 2020-12-18 DIAGNOSIS — I499 Cardiac arrhythmia, unspecified: Secondary | ICD-10-CM | POA: Diagnosis not present

## 2020-12-18 DIAGNOSIS — R079 Chest pain, unspecified: Secondary | ICD-10-CM | POA: Diagnosis not present

## 2020-12-18 DIAGNOSIS — Z951 Presence of aortocoronary bypass graft: Secondary | ICD-10-CM | POA: Insufficient documentation

## 2020-12-18 DIAGNOSIS — R6889 Other general symptoms and signs: Secondary | ICD-10-CM | POA: Diagnosis not present

## 2020-12-18 DIAGNOSIS — I10 Essential (primary) hypertension: Secondary | ICD-10-CM | POA: Insufficient documentation

## 2020-12-18 DIAGNOSIS — R4701 Aphasia: Secondary | ICD-10-CM | POA: Insufficient documentation

## 2020-12-18 DIAGNOSIS — Z7901 Long term (current) use of anticoagulants: Secondary | ICD-10-CM | POA: Diagnosis not present

## 2020-12-18 DIAGNOSIS — Z8673 Personal history of transient ischemic attack (TIA), and cerebral infarction without residual deficits: Secondary | ICD-10-CM | POA: Diagnosis not present

## 2020-12-18 DIAGNOSIS — G4489 Other headache syndrome: Secondary | ICD-10-CM | POA: Diagnosis not present

## 2020-12-18 DIAGNOSIS — R112 Nausea with vomiting, unspecified: Secondary | ICD-10-CM | POA: Diagnosis not present

## 2020-12-18 DIAGNOSIS — I2510 Atherosclerotic heart disease of native coronary artery without angina pectoris: Secondary | ICD-10-CM | POA: Diagnosis not present

## 2020-12-18 DIAGNOSIS — J45909 Unspecified asthma, uncomplicated: Secondary | ICD-10-CM | POA: Insufficient documentation

## 2020-12-18 DIAGNOSIS — R251 Tremor, unspecified: Secondary | ICD-10-CM | POA: Insufficient documentation

## 2020-12-18 DIAGNOSIS — R6883 Chills (without fever): Secondary | ICD-10-CM | POA: Diagnosis not present

## 2020-12-18 DIAGNOSIS — I672 Cerebral atherosclerosis: Secondary | ICD-10-CM | POA: Diagnosis not present

## 2020-12-18 DIAGNOSIS — R2990 Unspecified symptoms and signs involving the nervous system: Secondary | ICD-10-CM | POA: Diagnosis not present

## 2020-12-18 DIAGNOSIS — I639 Cerebral infarction, unspecified: Secondary | ICD-10-CM | POA: Diagnosis not present

## 2020-12-18 DIAGNOSIS — R4781 Slurred speech: Secondary | ICD-10-CM | POA: Diagnosis not present

## 2020-12-18 DIAGNOSIS — R519 Headache, unspecified: Secondary | ICD-10-CM | POA: Insufficient documentation

## 2020-12-18 DIAGNOSIS — Z743 Need for continuous supervision: Secondary | ICD-10-CM | POA: Diagnosis not present

## 2020-12-18 DIAGNOSIS — E039 Hypothyroidism, unspecified: Secondary | ICD-10-CM | POA: Diagnosis not present

## 2020-12-18 DIAGNOSIS — Z79899 Other long term (current) drug therapy: Secondary | ICD-10-CM | POA: Insufficient documentation

## 2020-12-18 DIAGNOSIS — Z8542 Personal history of malignant neoplasm of other parts of uterus: Secondary | ICD-10-CM | POA: Diagnosis not present

## 2020-12-18 LAB — CBC
HCT: 37.8 % (ref 36.0–46.0)
Hemoglobin: 12.1 g/dL (ref 12.0–15.0)
MCH: 29.6 pg (ref 26.0–34.0)
MCHC: 32 g/dL (ref 30.0–36.0)
MCV: 92.4 fL (ref 80.0–100.0)
Platelets: 283 10*3/uL (ref 150–400)
RBC: 4.09 MIL/uL (ref 3.87–5.11)
RDW: 13.6 % (ref 11.5–15.5)
WBC: 6.1 10*3/uL (ref 4.0–10.5)
nRBC: 0 % (ref 0.0–0.2)

## 2020-12-18 LAB — COMPREHENSIVE METABOLIC PANEL
ALT: 9 U/L (ref 0–44)
AST: 17 U/L (ref 15–41)
Albumin: 3.7 g/dL (ref 3.5–5.0)
Alkaline Phosphatase: 79 U/L (ref 38–126)
Anion gap: 11 (ref 5–15)
BUN: 15 mg/dL (ref 8–23)
CO2: 23 mmol/L (ref 22–32)
Calcium: 9.3 mg/dL (ref 8.9–10.3)
Chloride: 104 mmol/L (ref 98–111)
Creatinine, Ser: 0.94 mg/dL (ref 0.44–1.00)
GFR, Estimated: >60 mL/min (ref >60)
Glucose, Bld: 124 mg/dL — ABNORMAL HIGH (ref 70–99)
Potassium: 3.4 mmol/L — ABNORMAL LOW (ref 3.5–5.1)
Sodium: 138 mmol/L (ref 135–145)
Total Bilirubin: 0.4 mg/dL (ref 0.3–1.2)
Total Protein: 7 g/dL (ref 6.5–8.1)

## 2020-12-18 LAB — DIFFERENTIAL
Abs Immature Granulocytes: 0.02 10*3/uL (ref 0.00–0.07)
Basophils Absolute: 0 10*3/uL (ref 0.0–0.1)
Basophils Relative: 1 %
Eosinophils Absolute: 0.1 10*3/uL (ref 0.0–0.5)
Eosinophils Relative: 2 %
Immature Granulocytes: 0 %
Lymphocytes Relative: 25 %
Lymphs Abs: 1.5 10*3/uL (ref 0.7–4.0)
Monocytes Absolute: 0.6 10*3/uL (ref 0.1–1.0)
Monocytes Relative: 9 %
Neutro Abs: 3.9 10*3/uL (ref 1.7–7.7)
Neutrophils Relative %: 63 %

## 2020-12-18 LAB — URINALYSIS, ROUTINE W REFLEX MICROSCOPIC
Bilirubin Urine: NEGATIVE
Glucose, UA: NEGATIVE mg/dL
Hgb urine dipstick: NEGATIVE
Ketones, ur: 5 mg/dL — AB
Leukocytes,Ua: NEGATIVE
Nitrite: NEGATIVE
Protein, ur: NEGATIVE mg/dL
Specific Gravity, Urine: 1.006 (ref 1.005–1.030)
pH: 8 (ref 5.0–8.0)

## 2020-12-18 LAB — I-STAT CHEM 8, ED
BUN: 17 mg/dL (ref 8–23)
Calcium, Ion: 1.15 mmol/L (ref 1.15–1.40)
Chloride: 105 mmol/L (ref 98–111)
Creatinine, Ser: 0.9 mg/dL (ref 0.44–1.00)
Glucose, Bld: 122 mg/dL — ABNORMAL HIGH (ref 70–99)
HCT: 36 % (ref 36.0–46.0)
Hemoglobin: 12.2 g/dL (ref 12.0–15.0)
Potassium: 3.4 mmol/L — ABNORMAL LOW (ref 3.5–5.1)
Sodium: 139 mmol/L (ref 135–145)
TCO2: 25 mmol/L (ref 22–32)

## 2020-12-18 LAB — APTT: aPTT: 37 seconds — ABNORMAL HIGH (ref 24–36)

## 2020-12-18 LAB — CBG MONITORING, ED: Glucose-Capillary: 121 mg/dL — ABNORMAL HIGH (ref 70–99)

## 2020-12-18 LAB — PROTIME-INR
INR: 1.5 — ABNORMAL HIGH (ref 0.8–1.2)
Prothrombin Time: 17.8 seconds — ABNORMAL HIGH (ref 11.4–15.2)

## 2020-12-18 MED ORDER — ONDANSETRON HCL 4 MG/2ML IJ SOLN
INTRAMUSCULAR | Status: AC
  Administered 2020-12-18: 4 mg via INTRAVENOUS
  Filled 2020-12-18: qty 2

## 2020-12-18 MED ORDER — ACETAMINOPHEN 325 MG PO TABS
650.0000 mg | ORAL_TABLET | Freq: Once | ORAL | Status: AC
  Administered 2020-12-18: 650 mg via ORAL
  Filled 2020-12-18: qty 2

## 2020-12-18 MED ORDER — ONDANSETRON HCL 4 MG/2ML IJ SOLN: 4.0000 mg | Freq: Once | INTRAMUSCULAR | Status: AC

## 2020-12-18 MED ORDER — SODIUM CHLORIDE 0.9% FLUSH
3.0000 mL | Freq: Once | INTRAVENOUS | Status: AC
  Administered 2020-12-18: 3 mL via INTRAVENOUS

## 2020-12-18 NOTE — Discharge Instructions (Addendum)
You were seen in the emergency department for an episode of shaking weakness nausea and vomiting.  You were seen by the stroke team and had CAT scan of your head along with blood work EKG urinalysis and chest x-ray.  We did not find an obvious explanation of your symptoms.  Your symptoms improved while you were here.  Please contact your primary care doctor for close follow-up.  Return to the emergency department if any worsening or concerning symptoms.

## 2020-12-18 NOTE — ED Notes (Signed)
X-ray at bedside

## 2020-12-18 NOTE — Consult Note (Addendum)
Neurology Consultation  Reason for Consult: Aphasia and slurred speech Referring Physician: Dr. Melina Copa  CC: Aphasia, slurred speech, transient headache, and nausea  History is obtained from: EMS, Chart review, Patient  HPI: Shannon Scott is a 80 y.o. female with a medical history significant for chronic atrial fibrillation on Eliquis, acute CVA in October of 2021 affecting patient's speech, coronary artery disease s/p CABG x 3 in 2015, hyperlipidemia with statin intolerance, essential hypertension, left lower extremity lymphedema, and endometrial cancer with colonic metastasis s/p colostomy who presented to the ED via EMS for evaluation of acute speech changes with aphasia, dysarthria, transient headache, and nausea. Patient had spoken with her son via telephone this morning at 10:30 without noted abnormality per son. At 12:00 this afternoon, Shannon Scott called her son who noted that her speech was acutely altered with mild aphasia and dysarthria and EMS was activated. They found her to be acutely hypertensive on scene with a blood pressure of 220/100 and patient had complained of headache prior to EMS arrival that had since resolved. EMS activated a Code Stroke and was brought to the ED for further neurology evaluation.   At baseline Shannon Scott lives alone but has children that live nearby and visit often. She does need assistance with maintaining her home and ambulates with a walker or a cane. She does not drive at baseline.   LKW: 10:30  tpa given?: no, presentation is felt to be recrudescence of previous stroke symptoms, low suspicion for acute stroke on initial evaluation.  IR Thrombectomy? No, presentation is not consistent with LVO Modified Rankin Scale: 2-Slight disability-UNABLE to perform all activities but does not need assistance  ROS: A complete ROS was performed and is negative except as noted in the HPI.  Past Medical History:  Diagnosis Date   A-fib ([place]) 03/26/2020    Acute CVA (cerebrovascular accident) ([place]) 03/26/2020   Arthritis    Asthma    allergy induced   Bilateral carotid artery disease (HCC)    L carotid bruit   Bursitis    left hip   CAD (coronary artery disease)    Cancer (HCC)    Dyslipidemia    intolerant to statins, welchol, niacin, zetia   Goals of care, counseling/discussion 10/11/2017   History of blood transfusion    History of nuclear stress test 04/24/2012   lexiscan; normal study   Hypertension    Hypothyroidism    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 ([place]) 10/11/2017   Postoperative nausea and vomiting 01/02/2016   Past Surgical History:  Procedure Laterality Date   ABDOMINAL HYSTERECTOMY     Carotid Doppler  02/2012   40-59% right int carotid artery stenosis; 60-79% L int carotid stenosis; L carotid bruit   CORONARY ARTERY BYPASS GRAFT  03/12/2004   LIMA to LAD, SVG to circumflex, SVG to PDA   CYSTOSCOPY W/ URETERAL STENT PLACEMENT Left 12/06/2017   Procedure: CYSTOSCOPY WITH LEFT URETERAL STENT EXCHANGE;  Surgeon: Irine Seal, MD;  Location: [facility];  Service: Urology;  Laterality: Left;   CYSTOSCOPY WITH STENT PLACEMENT Bilateral 08/21/2017   Procedure: CYSTOSCOPY WITH STENT PLACEMENT;  Surgeon: Ceasar Mons, MD;  Location: [facility];  Service: Urology;  Laterality: Bilateral;   IR FLUORO GUIDE PORT INSERTION RIGHT  10/11/2017   IR GENERIC HISTORICAL  04/29/2016   IR RADIOLOGIST EVAL & MGMT 04/29/2016 Sandi Mariscal, MD [facility]   IR GENERIC HISTORICAL  05/12/2016   IR RADIOLOGIST EVAL & MGMT  05/12/2016 Sandi Mariscal, MD [facility]   IR IVC FILTER PLMT / S&I Burke Keels GUID/MOD SED  10/11/2017   IR US GUIDE VASC ACCESS RIGHT  10/11/2017   ROBOTIC ASSISTED TOTAL HYSTERECTOMY WITH BILATERAL SALPINGO OOPHERECTOMY Bilateral 12/30/2015   Procedure: XI ROBOTIC ASSISTED TOTAL HYSTERECTOMY WITH BILATERAL SALPINGO OOPHORECTOMY WITH SENTAL LYMPH NODE BIOPSY;  Surgeon: Nancy Marus,  MD;  Location: [facility];  Service: Gynecology;  Laterality: Bilateral;   TONSILLECTOMY     TRANSTHORACIC ECHOCARDIOGRAM  04/2007   EF>55%; mild MR; mild-mod TR; mild pulm HTN; mild calcification of aortiv valve leaflets with mild valvular aortic stenosis   TUBAL LIGATION     Family History  Problem Relation Age of Onset   Hypertension Mother    Heart disease Mother        Died in her 21s   Stroke Father    Kidney disease Brother    Heart disease Brother        also HTN, hyperlipidemia   Heart attack Brother    Stroke Sister        x2   Hypertension Sister    Social History:   reports that she has never smoked. She has never used smokeless tobacco. She reports that she does not drink alcohol and does not use drugs.  Medications  Current Facility-Administered Medications:    acetaminophen (TYLENOL) tablet 650 mg, 650 mg, Oral, Once, Hayden Rasmussen, MD   ondansetron [facility]) 4 MG/2ML injection, , , ,    ondansetron (ZOFRAN) injection 4 mg, 4 mg, Intravenous, Once, Toberman, Stevi W, NP   sodium chloride flush (NS) 0.9 % injection 3 mL, 3 mL, Intravenous, Once, Malvin Johns, MD  Current Outpatient Medications:    acetaminophen (TYLENOL) 500 MG tablet, Take 500 mg by mouth as needed (pain)., Disp: , Rfl:    apixaban (ELIQUIS) 5 MG TABS tablet, Take 1 tablet (5 mg total) by mouth 2 (two) times daily., Disp: 60 tablet, Rfl: 0   clobetasol ointment (TEMOVATE) 3.71 %, Apply 1 application topically 3 (three) times a week., Disp: 30 g, Rfl: 0   diltiazem (TIAZAC) 300 MG 24 hr capsule, TAKE 1 CAPSULE(300 MG) BY MOUTH DAILY (Patient taking differently: Take 300 mg by mouth at bedtime.), Disp: 30 capsule, Rfl: 1   Evolocumab (REPATHA SURECLICK) 062 MG/ML SOAJ, Inject 1 Dose into the skin every 14 (fourteen) days., Disp: 2 mL, Rfl: 11   levothyroxine (SYNTHROID, LEVOTHROID) 50 MCG tablet, Take 1 tablet (50 mcg total) by mouth daily before breakfast., Disp: 30 tablet, Rfl: 1    lidocaine-prilocaine (EMLA) cream, Apply a dime size to port-a-cath 1-2 hours prior to access. Cover with Saran wrap., Disp: 30 g, Rfl: 2   polyethylene glycol (MIRALAX / GLYCOLAX) 17 g packet, Take 17 g by mouth daily as needed for severe constipation., Disp: 14 each, Rfl: 0   traMADol (ULTRAM) 50 MG tablet, TAKE 2 TABLETS(100 MG) BY MOUTH TWICE DAILY (Patient taking differently: Take 50 mg by mouth 2 (two) times daily.), Disp: 120 tablet, Rfl: 0   conjugated estrogens (PREMARIN) vaginal cream, Place 1 Applicatorful vaginally 3 (three) times a week. Apply nightly for 2 weeks, then 3 times a week. (Patient not taking: Reported on 12/18/2020), Disp: 42.5 g, Rfl: 1   lidocaine (XYLOCAINE) 5 % ointment, Apply 1 application topically as needed. Up to TID for treatment of burning symptoms of the vulva (Patient not taking: Reported on 12/18/2020), Disp: 35.44 g, Rfl: 0   mometasone (ELOCON) 0.1 %  cream, Apply 1 application topically daily. Use nightly for 1-2 weeks, then use every other night (Patient not taking: Reported on 12/18/2020), Disp: 45 g, Rfl: 0  Facility-Administered Medications Ordered in Other Encounters:    sodium chloride flush (NS) 0.9 % injection 10 mL, 10 mL, Intracatheter, PRN, Volanda Napoleon, MD, 10 mL at 02/05/20 1440  Exam: Current vital signs: BP (!) 168/59   Pulse 66   Temp 98.1 F (36.7 C) (Oral)   Resp 20   SpO2 100%  Vital signs in last 24 hours: Temp:  [98.1 F (36.7 C)] 98.1 F (36.7 C) (07/21 1440) Pulse Rate:  [66] 66 (07/21 1440) Resp:  [20] 20 (07/21 1440) BP: (165-175)/(58-77) 168/59 (07/21 1440) SpO2:  [100 %] 100 % (07/21 1440)  GENERAL: Awake, alert, uncomfortable appearing patient  Psych: Affect appropriate for situation, she is calm and cooperative with examination Head: Normocephalic and atraumatic without obvious abnormality EENT: Normal conjunctivae, wears eye glasses, no OP obstruction, dry mucous membranes LUNGS: Normal respiratory effort.  Non-labored breathing CV: Regular rate, irregular rhythm on cardiac monitor ABDOMEN: Non-tender, ostomy with ostomy bag secured with brown stool output.  Ext: warm, well perfused. Left lower extremity with lymphedema.   NEURO:  Mental Status: Awake, alert, and oriented to person, place, time, and situation. She is able to provide a clear and coherent history of present illness within the limitation of intermittent mild aphasia with word-finding difficulty. Speech is mildly dysarthric. Naming, repetition, and comprehension intact. Fluency mildly and intermittently impaired.  No neglect is noted.  Cranial Nerves:  II: PERRL. Visual fields full.  III, IV, VI: EOMI without ptosis, nystagmus, or gaze preference.  V: Sensation is intact to light touch and symmetrical to face.  VII: Face is symmetric resting and smiling.  VIII: Hearing is intact to voice IX, X: Palate elevation is symmetric. Phonation normal.  XI: Normal sternocleidomastoid and trapezius muscle strength XII: Tongue protrudes midline without fasciculations.   Motor: 5/5 strength is all muscle groups without vertical drift on assessment.  Tone is normal. Bulk is normal.  Sensation: Intact to light touch bilaterally in all four extremities. No extinction to DSS present.  Coordination: FTN intact bilaterally. HKS intact bilaterally. No pronator drift.  DTRs: 2+ and symmetric biceps and brachioradialis Gait: Deferred  NIHSS: 1a Level of Conscious.: 0 1b LOC Questions: 0 1c LOC Commands: 0 2 Best Gaze: 0 3 Visual: 0 4 Facial Palsy: 0 5a Motor Arm - left: 0 5b Motor Arm - Right: 0 6a Motor Leg - Left: 0 6b Motor Leg - Right: 0 7 Limb Ataxia: 0 8 Sensory: 0 9 Best Language: 1 10 Dysarthria: 1 11 Extinct. and Inatten.: 0 TOTAL: 2  Labs I have reviewed labs in epic and the results pertinent to this consultation are: CBC    Component Value Date/Time   WBC 6.1 12/18/2020 1406   RBC 4.09 12/18/2020 1406   HGB 12.2  12/18/2020 1413   HGB 11.7 (L) 12/10/2020 1146   HCT 36.0 12/18/2020 1413   PLT 283 12/18/2020 1406   PLT 270 12/10/2020 1146   MCV 92.4 12/18/2020 1406   MCH 29.6 12/18/2020 1406   MCHC 32.0 12/18/2020 1406   RDW 13.6 12/18/2020 1406   LYMPHSABS 1.5 12/18/2020 1406   MONOABS 0.6 12/18/2020 1406   EOSABS 0.1 12/18/2020 1406   BASOSABS 0.0 12/18/2020 1406   CMP     Component Value Date/Time   NA 139 12/18/2020 1413   NA 136 (A) 09/08/2017  0000   K 3.4 (L) 12/18/2020 1413   CL 105 12/18/2020 1413   CO2 23 12/18/2020 1406   GLUCOSE 122 (H) 12/18/2020 1413   BUN 17 12/18/2020 1413   BUN 8 09/08/2017 0000   CREATININE 0.90 12/18/2020 1413   CREATININE 0.86 12/10/2020 1146   CREATININE 0.69 08/02/2016 1519   CALCIUM 9.3 12/18/2020 1406   PROT 7.0 12/18/2020 1406   ALBUMIN 3.7 12/18/2020 1406   AST 17 12/18/2020 1406   AST 13 (L) 12/10/2020 1146   ALT 9 12/18/2020 1406   ALT 7 12/10/2020 1146   ALKPHOS 79 12/18/2020 1406   BILITOT 0.4 12/18/2020 1406   BILITOT 0.3 12/10/2020 1146   GFRNONAA >60 12/18/2020 1406   GFRNONAA >60 12/10/2020 1146   GFRAA >60 02/05/2020 1045   Lipid Panel     Component Value Date/Time   CHOL 278 (H) 09/08/2020 0000   CHOL 333 (H) 08/02/2016 1519   TRIG 104 09/08/2020 0000   TRIG 87 08/02/2016 1519   HDL 62 09/08/2020 0000   HDL 56 08/02/2016 1519   CHOLHDL 4.5 (H) 09/08/2020 0000   CHOLHDL 7.4 03/28/2020 0839   VLDL 15 03/28/2020 0839   LDLCALC 198 (H) 09/08/2020 0000   LDLCALC 257 (H) 08/02/2016 1519   Lab Results  Component Value Date   HGBA1C 5.6 03/28/2020   Imaging I have reviewed the images obtained:  CT-scan of the brain 12/18/2020: 1. No acute finding by CT. Chronic small-vessel ischemic changes. Subtle evidence of an old infarction in the left posterior temporal lobe which was acute in October of last year. 2. ASPECTS is 10  Assessment: 80 y.o. female who presented to the ED for evaluation of dysarthria, aphasia,  transient headache, and nausea. Patient with minimal speech disturbance from stroke in October of 2021 but per son who spoke with her on telephone, her speech was acutely worse than baseline.  - Examination revealed patient with nausea but without focal weakness. Her speech was mildly aphasic with intermittent minimal difficulty with word finding and mild dysarthria that is not consistent with her baseline speech. Initial NIHSS of 2.  - Delay in obtaining [place] due to patient inability to tolerate laying flat 2/2 nausea. Patient given ondansetron 4 mg IV in radiology for nausea and patient was subsequently able to tolerate imaging.  - CTH obtained without acute finding but with evidence of old infarction in the left posterior temporal lobe and an ASPECTS of 10. Vessel imaging not obtained due to patient not being a candidate for tPA due to fully anticoagulated with Eliquis, last dose this morning. Presentation is not consistent with LVO, therefore does not qualify for IR.  - Previous stroke in October of 2021 thought to be due to patient with known atrial fibrillation not on anticoagulation. Patient with residual expressive > receptive aphasia. She was placed on Eliquis for secondary stroke prevention following CVA.  - Presentation is felt to be related to recrudescence of previous stroke symptoms with hypertensive urgency on presentation with or without superimposed toxic-metabolic process vs infection. Initial blood pressure on EMS evaluation was 220/100. Initial CBC without leukocytosis, patient afebrile on arrival  Impression: Hypertensive urgency- initial blood pressure of 220/100 Aphasia- history of stroke with residual aphasia No significant neurologic deficits - mild dysarthria worsening felt to be recrudescence in setting of severe vomiting   Recommendations: - Code stroke cancelled following initial [facility] WO contrast imaging - Management of hypertension per EDP/primary team - No further  inpatient neurologic workup  indicated.  Neurology to sign off, but please re-engage if additional neurologic concerns arise.  Pt seen by NP/Neuro and later by MD. Note/plan to be edited by MD as needed.  Anibal Henderson, AGAC-NP [facility] Pager: [phone]   Neurology Attending Attestation   I examined the patient and discussed plan with Ms. Toberman NP. Above note has been edited by me to reflect my findings and recommendations. I was present throughout the stroke code and made all significant decisions and personally reviewed CNS imaging.     Su Monks, MD [facility] [phone]   If 7pm- 7am, please page neurology on call as listed in [place].

## 2020-12-18 NOTE — ED Notes (Signed)
MD at bedside updating Pt and family

## 2020-12-18 NOTE — ED Provider Notes (Signed)
[facility] EMERGENCY DEPARTMENT Provider Note   CSN: [id number] Arrival date & time: 12/18/20  1403     History Chief Complaint  Patient presents with   Code Stroke    Shannon Scott is a 80 y.o. female.  Patient is a 80 year old female with a history of prior stroke in October 2021 that had presented with expressive aphasia and slurred speech.  She also has a history of asthma, CAD, hyperlipidemia, atrial fibrillation on Eliquis, hypertension and uterine cancer.  She presents today as a code stroke.  Her last known normal was 1030.  Patient states that this time she noted that she was having a hard time speaking and had slurred speech.  She also felt like she was weak in her extremities.  She was shaking in her legs.  She cannot really tell me if he was weaker on one side or the other.  Code stroke was activated by EMS and patient was brought to the ED.  She states that she feels pretty much back to baseline now.  She does have a right-sided headache.  She denies any recent illnesses.      Past Medical History:  Diagnosis Date   A-fib ([place]) 03/26/2020   Acute CVA (cerebrovascular accident) ([place]) 03/26/2020   Arthritis    Asthma    allergy induced   Bilateral carotid artery disease (HCC)    L carotid bruit   Bursitis    left hip   CAD (coronary artery disease)    Cancer (HCC)    Dyslipidemia    intolerant to statins, welchol, niacin, zetia   Goals of care, counseling/discussion 10/11/2017   History of blood transfusion    History of nuclear stress test 04/24/2012   lexiscan; normal study   Hypertension    Hypothyroidism    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 ([place]) 10/11/2017   Postoperative nausea and vomiting 01/02/2016    Patient Active Problem List   Diagnosis Date Noted   Acute CVA (cerebrovascular accident) ([place]) 03/28/2020   Stroke ([place]) 03/26/2020   Expressive aphasia 03/26/2020   Acute vaginitis  01/03/2020   Acute vulvitis 01/03/2020   Lower extremity edema 11/09/2018   Back pain 11/09/2018   IDA (iron deficiency anemia) 10/21/2017   Abdominal mass    Deep vein thrombosis (DVT) of left lower extremity ([place])    Pelvic mass    Acute blood loss anemia    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 ([place]) 10/11/2017   Goals of care, counseling/discussion 10/11/2017   Rectal bleeding 10/03/2017   Normocytic anemia 10/03/2017   Left hip pain 10/03/2017   Generalized weakness 10/03/2017   Colostomy status ([place]) 08/30/2017   Ureteral stent displacement, subsequent encounter 08/30/2017   Atrial fibrillation with RVR ([place]) 08/30/2017   Hypophosphatemia 08/30/2017   Hypomagnesemia 08/30/2017   Acute blood loss as cause of postoperative anemia 08/30/2017   Chronic anemia 08/30/2017   Left leg swelling 08/30/2017   GERD (gastroesophageal reflux disease) 08/30/2017   Atrial fibrillation, chronic (HCC)    Bowel perforation ([place]) 08/18/2017   Colonic mass 08/18/2017   Ureteral dilatation 08/18/2017   Intractable right heel pain 10/07/2016   Plantar fasciitis of right foot 10/07/2016   Postoperative nausea and vomiting 01/02/2016   Hyponatremia 01/01/2016   Acute hypokalemia 01/01/2016   Endometrial cancer ([place]) 12/30/2015   Familial hypercholesteremia 07/28/2015   Aortic valve stenosis 08/02/2014   S/P CABG x 3 01/13/2013  Palpitations 01/13/2013   Medication intolerance 01/13/2013   Chronic pain 01/13/2013   TIA (transient ischemic attack) 01/13/2013   Carotid stenosis 01/13/2013   Hypothyroid 10/05/2010   Essential hypertension 10/05/2010   Gastroesophagitis 10/05/2010   Atrophic gastritis 10/05/2010    Past Surgical History:  Procedure Laterality Date   ABDOMINAL HYSTERECTOMY     Carotid Doppler  02/2012   40-59% right int carotid artery stenosis; 60-79% L int carotid stenosis; L carotid bruit   CORONARY ARTERY BYPASS GRAFT   03/12/2004   LIMA to LAD, SVG to circumflex, SVG to PDA   CYSTOSCOPY W/ URETERAL STENT PLACEMENT Left 12/06/2017   Procedure: CYSTOSCOPY WITH LEFT URETERAL STENT EXCHANGE;  Surgeon: Irine Seal, MD;  Location: [facility];  Service: Urology;  Laterality: Left;   CYSTOSCOPY WITH STENT PLACEMENT Bilateral 08/21/2017   Procedure: CYSTOSCOPY WITH STENT PLACEMENT;  Surgeon: Ceasar Mons, MD;  Location: [facility];  Service: Urology;  Laterality: Bilateral;   IR FLUORO GUIDE PORT INSERTION RIGHT  10/11/2017   IR GENERIC HISTORICAL  04/29/2016   IR RADIOLOGIST EVAL & MGMT 04/29/2016 Sandi Mariscal, MD [facility] INTERV RAD   IR GENERIC HISTORICAL  05/12/2016   IR RADIOLOGIST EVAL & MGMT 05/12/2016 Sandi Mariscal, MD [facility] INTERV RAD   IR IVC FILTER PLMT / S&I Burke Keels GUID/MOD SED  10/11/2017   IR US GUIDE VASC ACCESS RIGHT  10/11/2017   ROBOTIC ASSISTED TOTAL HYSTERECTOMY WITH BILATERAL SALPINGO OOPHERECTOMY Bilateral 12/30/2015   Procedure: XI ROBOTIC ASSISTED TOTAL HYSTERECTOMY WITH BILATERAL SALPINGO OOPHORECTOMY WITH SENTAL LYMPH NODE BIOPSY;  Surgeon: Nancy Marus, MD;  Location: [facility];  Service: Gynecology;  Laterality: Bilateral;   TONSILLECTOMY     TRANSTHORACIC ECHOCARDIOGRAM  04/2007   EF>55%; mild MR; mild-mod TR; mild pulm HTN; mild calcification of aortiv valve leaflets with mild valvular aortic stenosis   TUBAL LIGATION       OB History   No obstetric history on file.     Family History  Problem Relation Age of Onset   Hypertension Mother    Heart disease Mother        Died in her 47s   Stroke Father    Kidney disease Brother    Heart disease Brother        also HTN, hyperlipidemia   Heart attack Brother    Stroke Sister        x2   Hypertension Sister     Social History   Tobacco Use   Smoking status: Never   Smokeless tobacco: Never  Vaping Use   Vaping Use: Never used  Substance Use Topics   Alcohol use: No   Drug use: No    Home Medications Prior to Admission  medications   Medication Sig Start Date End Date Taking? Authorizing Provider  acetaminophen (TYLENOL) 500 MG tablet     [provider]  apixaban (ELIQUIS) 5 MG TABS tablet Take 1 tablet (5 mg total) by mouth 2 (two) times daily. 03/29/20   Amin, Jeanella Flattery, MD  clobetasol ointment (TEMOVATE) 0.24 % Apply 1 application topically 3 (three) times a week. 04/23/20   Lafonda Mosses, MD  conjugated estrogens (PREMARIN) vaginal cream Place 1 Applicatorful vaginally 3 (three) times a week. Apply nightly for 2 weeks, then 3 times a week. 06/30/20   Lafonda Mosses, MD  diltiazem (TIAZAC) 300 MG 24 hr capsule TAKE 1 CAPSULE(300 MG) BY MOUTH DAILY Patient taking differently: Take 300 mg by mouth daily. 06/13/18   Ennever,  Rudell Cobb, MD  Evolocumab (REPATHA SURECLICK) 003 MG/ML SOAJ Inject 1 Dose into the skin every 14 (fourteen) days. 06/18/20   Hilty, Nadean Corwin, MD  ketoconazole (NIZORAL) 2 % cream Apply topically 2 (two) times daily. 11/29/20   [provider]  levothyroxine (SYNTHROID, LEVOTHROID) 50 MCG tablet Take 1 tablet (50 mcg total) by mouth daily before breakfast. 10/25/17   Tanner, Lyndon Code., PA-C  lidocaine (XYLOCAINE) 5 % ointment Apply 1 application topically as needed. Up to TID for treatment of burning symptoms of the vulva 04/22/20   Lafonda Mosses, MD  lidocaine-prilocaine (EMLA) cream Apply a dime size to port-a-cath 1-2 hours prior to access. Cover with Saran wrap. 05/20/20   Volanda Napoleon, MD  mometasone (ELOCON) 0.1 % cream Apply 1 application topically daily. Use nightly for 1-2 weeks, then use every other night 11/07/20   Lafonda Mosses, MD  polyethylene glycol (MIRALAX / GLYCOLAX) 17 g packet Take 17 g by mouth daily as needed for severe constipation. 03/29/20   Amin, Jeanella Flattery, MD  traMADol (ULTRAM) 50 MG tablet TAKE 2 TABLETS(100 MG) BY MOUTH TWICE DAILY 10/14/20   Volanda Napoleon, MD    Allergies    Statins and Ciprofloxacin  Review of Systems    Review of Systems  Constitutional:  Negative for chills, diaphoresis, fatigue and fever.  HENT:  Negative for congestion, rhinorrhea and sneezing.   Eyes: Negative.   Respiratory:  Negative for cough, chest tightness and shortness of breath.   Cardiovascular:  Negative for chest pain and leg swelling.  Gastrointestinal:  Negative for abdominal pain, blood in stool, diarrhea, nausea and vomiting.  Genitourinary:  Negative for difficulty urinating, flank pain, frequency and hematuria.  Musculoskeletal:  Negative for arthralgias and back pain.  Skin:  Negative for rash.  Neurological:  Positive for tremors, speech difficulty and weakness. Negative for dizziness, numbness and headaches.   Physical Exam Updated Vital Signs There were no vitals taken for this visit.  Physical Exam Constitutional:      Appearance: She is well-developed.  HENT:     Head: Normocephalic and atraumatic.  Eyes:     Pupils: Pupils are equal, round, and reactive to light.  Cardiovascular:     Rate and Rhythm: Normal rate and regular rhythm.     Heart sounds: Normal heart sounds.  Pulmonary:     Effort: Pulmonary effort is normal. No respiratory distress.     Breath sounds: Normal breath sounds. No wheezing or rales.  Chest:     Chest wall: No tenderness.  Abdominal:     General: Bowel sounds are normal.     Palpations: Abdomen is soft.     Tenderness: There is no abdominal tenderness. There is no guarding or rebound.  Musculoskeletal:        General: Normal range of motion.     Cervical back: Normal range of motion and neck supple.  Lymphadenopathy:     Cervical: No cervical adenopathy.  Skin:    General: Skin is warm and dry.     Findings: No rash.  Neurological:     General: No focal deficit present.     Mental Status: She is alert and oriented to person, place, and time.     Comments: No aphasia noted.  Motor 5 out of 5 all extremities sensation grossly intact to light touch all extremities, no  facial drooping    ED Results / Procedures / Treatments   Labs (all labs ordered are  listed, but only abnormal results are displayed) Labs Reviewed  PROTIME-INR - Abnormal; Notable for the following components:      Result Value   Prothrombin Time 17.8 (*)    INR 1.5 (*)    All other components within normal limits  APTT - Abnormal; Notable for the following components:   aPTT 37 (*)    All other components within normal limits  COMPREHENSIVE METABOLIC PANEL - Abnormal; Notable for the following components:   Potassium 3.4 (*)    Glucose, Bld 124 (*)    All other components within normal limits  I-STAT CHEM 8, ED - Abnormal; Notable for the following components:   Potassium 3.4 (*)    Glucose, Bld 122 (*)    All other components within normal limits  CBG MONITORING, ED - Abnormal; Notable for the following components:   Glucose-Capillary 121 (*)    All other components within normal limits  CBC  DIFFERENTIAL    EKG EKG Interpretation  Date/Time:  Thursday December 18 2020 14:37:05 EDT Ventricular Rate:  68 PR Interval:    QRS Duration: 107 QT Interval:  435 QTC Calculation: 463 R Axis:   58 Text Interpretation: Atrial fibrillation Abnormal R-wave progression, early transition Probable inferior infarct, old Confirmed by Malvin Johns [phone]) on 12/18/2020 2:41:23 PM  Radiology CT HEAD CODE STROKE WO CONTRAST  Result Date: 12/18/2020 CLINICAL DATA:  Code stroke. Neurological deficit. Acute stroke suspected. Specific symptoms not described. EXAM: CT HEAD WITHOUT CONTRAST TECHNIQUE: Contiguous axial images were obtained from the base of the skull through the vertex without intravenous contrast. COMPARISON:  03/26/2020 FINDINGS: Brain: No abnormality is seen affecting the brainstem or cerebellum. There is old infarction of the left posterior temporal lobe, fairly subtle by CT, which was acute in October of last year. There is an old lacunar infarction in the right basal ganglia.  Mild chronic small-vessel ischemic changes affect the cerebral hemispheric white matter. No sign of acute infarction, mass lesion, hemorrhage, hydrocephalus or extra-axial collection. Vascular: There is atherosclerotic calcification of the major vessels at the base of the brain. Skull: Negative Sinuses/Orbits: Clear/normal Other: None ASPECTS (Walker Stroke Program Early CT Score) - Ganglionic level infarction (caudate, lentiform nuclei, internal capsule, insula, M1-M3 cortex): 7 - Supraganglionic infarction (M4-M6 cortex): 3 Total score (0-10 with 10 being normal): 10 IMPRESSION: 1. No acute finding by CT. Chronic small-vessel ischemic changes. Subtle evidence of an old infarction in the left posterior temporal lobe which was acute in October of last year. 2. ASPECTS is 10 3. These results were communicated to Dr. Quinn Axe At 2:25 pm on 12/18/2020 by text page via the [facility] messaging system. Electronically Signed   By: Nelson Chimes M.D.   On: 12/18/2020 14:26    Procedures Procedures   Medications Ordered in ED Medications  sodium chloride flush (NS) 0.9 % injection 3 mL (has no administration in time range)  ondansetron (ZOFRAN) injection 4 mg (has no administration in time range)  ondansetron (ZOFRAN) 4 MG/2ML injection (has no administration in time range)    ED Course  I have reviewed the triage vital signs and the nursing notes.  Pertinent labs & imaging results that were available during my care of the patient were reviewed by me and considered in my medical decision making (see chart for details).    MDM Rules/Calculators/A&P                           Patient  is a 80 year old female who presents as a code stroke.  She presented with some new slurred speech and weakness although I cannot elicit if it was unilateral that started about 1030 this morning.  Her head CT shows no acute abnormality.  Her labs are nonconcerning.  Code stroke was canceled by Dr. Quinn Axe.  I am awaiting to hear back  from Dr. Quinn Axe regarding plan for patient from a neurology standpoint.  Dr. Melina Copa to take over pending this information. Final Clinical Impression(s) / ED Diagnoses Final diagnoses:  None    Rx / DC Orders ED Discharge Orders     None        Malvin Johns, MD 12/18/20 1514

## 2020-12-18 NOTE — Code Documentation (Signed)
Stroke Response Nurse Documentation Code Stroke Documentation Shannon Scott  is a 80 y.o.  y.o. female  arriving to [facility] ED via [facility] on 12/18/2020 with PMH of CAD, HTN, CVA, CA, Hypthyroidism, A-fib. Code stroke was activated by EMS. Patient from home where she was LKW at 1030 when she spoke on the phone with her son, when they spoke again at 1200 he noted aphasia and slurred speech. Patient taking Eliquis (apixaban) daily PTA. Stroke team at the bedside on patient arrival, CBG 121, labs drawn and patient cleared for CT by Dr. Tamera Scott. Patient taken to CT with team, minor delay in CT d/t patient vomiting and unable to lie flat. V/O for 67m Zofran from Dr. SQuinn Scott given and patient placed onto CT table. NIHSS 0, see documentation for details and code stroke times. Patient with no appreciable symptoms on exam. The following imaging was completed:  CT. Patient is not a candidate for tPA due to not believed to be acute stroke per Dr. SQuinn Scott Will cancel code stroke per Dr. SQuinn Scott Bedside handoff with ED RN Shannon Scott.    Shannon Scott Stroke Response RN [phone]

## 2020-12-18 NOTE — ED Triage Notes (Signed)
Pt BIB [facility] after son called EMS d/t slurred speech and aphasia. Pt XQK 2081, son called at 22 and that's when pt states she started feeling and speaking abnormally. Pt reports having headache this morning and reported it was gone when fire arrived, now reports headache again. 46m of zofran en route and another 476min CT.

## 2020-12-18 NOTE — ED Notes (Signed)
Ambulates several times around room with minimal assistance. Steady on feet with no complaints.

## 2020-12-18 NOTE — ED Provider Notes (Signed)
Signout from Dr. Tamera Punt.  80 year old female with prior history of stroke here with acute strokelike symptoms of headache and slurred speech. She was a code stroke activation.  Plan is to follow-up on neurology recommendations. Physical Exam  BP (!) 168/59   Pulse 66   Temp 98.1 F (36.7 C) (Oral)   Resp 20   SpO2 100%   Physical Exam  ED Course/Procedures     Procedures  MDM  I spoke with neurology Dr. Quinn Axe.  She has canceled the code stroke.  She thinks this is just a recrudescence of her stroke symptoms due to an illness.  She said the patient had copious amounts of vomiting during her work-up.  I found the patient lying quietly in bed.  She said her headache is improving and she is not nauseous anymore.  I had her review her symptoms today.  She said she was sitting on the edge of the bed when her arms and legs started shaking all over.  Notes when she had difficulty with her speech.  She called her son who came to see her and said this is similar to when she had a stroke although at that time it was much more severe with her speech symptoms.  He says her speech is pretty much back to baseline now.  We will add on a chest x-ray and urinalysis.  Chest and urine unremarkable.  Patient is ambulated in the department without any difficulty.  Son is here and says she is at her baseline.  Will discharge and have close follow-up with her PCP.  Return instructions discussed       Hayden Rasmussen, MD 12/19/20 [phone]

## 2020-12-19 ENCOUNTER — Other Ambulatory Visit: Payer: Self-pay | Admitting: Hematology & Oncology

## 2020-12-19 DIAGNOSIS — M549 Dorsalgia, unspecified: Secondary | ICD-10-CM

## 2020-12-19 DIAGNOSIS — C541 Malignant neoplasm of endometrium: Secondary | ICD-10-CM

## 2020-12-19 DIAGNOSIS — M545 Low back pain, unspecified: Secondary | ICD-10-CM

## 2020-12-19 DIAGNOSIS — M792 Neuralgia and neuritis, unspecified: Secondary | ICD-10-CM

## 2020-12-19 DIAGNOSIS — G8929 Other chronic pain: Secondary | ICD-10-CM

## 2020-12-24 DIAGNOSIS — Z933 Colostomy status: Secondary | ICD-10-CM | POA: Diagnosis not present

## 2020-12-24 DIAGNOSIS — K56609 Unspecified intestinal obstruction, unspecified as to partial versus complete obstruction: Secondary | ICD-10-CM | POA: Diagnosis not present

## 2020-12-31 ENCOUNTER — Encounter: Payer: Self-pay | Admitting: Adult Health

## 2020-12-31 ENCOUNTER — Ambulatory Visit (INDEPENDENT_AMBULATORY_CARE_PROVIDER_SITE_OTHER): Payer: Medicare Other | Admitting: Adult Health

## 2020-12-31 VITALS — BP 158/76 | HR 64 | Ht 61.0 in | Wt 141.0 lb

## 2020-12-31 DIAGNOSIS — I10 Essential (primary) hypertension: Secondary | ICD-10-CM

## 2020-12-31 DIAGNOSIS — I63412 Cerebral infarction due to embolism of left middle cerebral artery: Secondary | ICD-10-CM | POA: Diagnosis not present

## 2020-12-31 DIAGNOSIS — R29818 Other symptoms and signs involving the nervous system: Secondary | ICD-10-CM

## 2020-12-31 DIAGNOSIS — E785 Hyperlipidemia, unspecified: Secondary | ICD-10-CM | POA: Diagnosis not present

## 2020-12-31 DIAGNOSIS — I480 Paroxysmal atrial fibrillation: Secondary | ICD-10-CM

## 2020-12-31 NOTE — Progress Notes (Signed)
[facility] [address] [place] [address] [phone]       STROKE FOLLOW UP NOTE  [name] Date of Birth:  [birth year] Medical Record Number:  [id number]   Reason for Referral: stroke follow up    SUBJECTIVE:   CHIEF COMPLAINT:  Chief Complaint  Patient presents with   Follow-up    Rm 3 with son Kristeen Mans Pt is well,  had some come kind of episode about 2 weeks ago but overall stable, symptoms have improved      HPI:   Today, 12/31/2020, Shannon Scott returns for overdue stroke follow-up accompanied by her son, Shannon Scott.  Recent ED evaluation for evaluation of acute speech changes with aphasia, dysarthria, transient headache, upper and lower extremity shaking and nausea on 12/18/2020 with HTN noted 220/100. Eval by neurologist Dr. Fuller Plan -likely recrudescence of prior stroke symptoms with hypertensive urgency possibly in setting of severe vomiting. CTH negative for acute findings. She quickly returned back to baseline and headache resolved.  She has not had any reoccurring or new stroke/TIA symptoms since that time. Questions potential cause of recent event.  She was able to stop her legs from shaking if she placed her hands on her knees.  Denies any loss of consciousness or confusion.  Of note, she received iron infusion the day prior -prior infusions without difficulty.  Post stroke, struggles occasional with recall but otherwise doing well and improvement in speech.  Chronic gait impairment with occasional use of cane and denies any recent falls.  Reports compliance on Eliquis without associated side effects.  Repatha initiated in 05/2020 which she has been tolerating with improvement of LDL at 198 (prior 358) -routinely followed by cardiology.  Blood pressures today 158/76- monitors at home and typically stable 120s-130s.  No further concerns at this time.     History provided for reference purposes only Initial visit 05/21/2020 JM: Shannon Scott is  being seen for hospital follow-up accompanied by her daughter in person and son via telephone.  Reports residual aphasia but overall greatly improving. She worked with [facility] for a couple sessions then released. She does admit to occasional difficulty understanding what people are saying but believes this is more due to difficulty hearing.  She has been having some mild balance difficulties. Use of cane which she used PTA with chronic pain receiving injections by orthopedics.  Denies new or worsening stroke/TIA symptoms.  Remains on Eliquis 5 mg twice daily tolerating well without side effects.  Blood pressure today 144/72.  She has follow-up with cardiology scheduled on 06/02/2020 and plans on further discussing initiation of PCSK9 inhibitor.  No concerns at this time.  Stroke admission 03/26/2020 Shannon Scott is a 80 y.o. female with history of  asthma, CAD, HLD, afib, HTN, Uterina Ca locally advanced who presented to [facility] ED on 03/26/2020 with expressive aphasia, R sided HA and neck pain.  Personally reviewed hospitalization pertinent progress notes, lab work and imaging with summary provided.  Transfer to [facility] ED evaluated by Dr. Leonie Man with stroke work-up revealing left MCA infarct in setting of L M2 and possible M3 branch irregularity, embolic secondary to known AF not on AC.  Considered IR due to worsening following admission but felt not to be a candidate.  Not previously on [facility] as episode of AF in 2019 secondary to stress from colonoscopy prep and plan for [facility] if recurred.  In setting of recent stroke, recommend initiating Eliquis for secondary stroke prevention.  Uncontrolled HTN with BP 210/76 on diltiazem.  History of HLD with statin intolerance (myalgias and memory loss) with LDL 358 and advised outpatient cardiology follow-up for initiation of PCSK9 injections.  Other stroke risk factors include advanced age and CAD.  Other active problems include uterine cancer w/ colonic mets  s/p colostomy.  Residual deficits of expressive> receptive aphasia.  Per therapy recommendations, she was discharged home with [facility].   Stroke:   L MCA infarct embolic secondary to known AF not on AC CT head No acute abnormality. R basal ganglia lacune new from 09/2010. R subinsular white matter infarct. Atrophy.  MRI  Inferior L parietal lobe and posterior insular infarct. Small vessel disease. Chronic R basal ganglia lacune. CTA head & neck no new infarct. L M2 and possible M3 branch irregularilty. ICA < 50% plaque. Irregular plaque origin L ICA. CT perfusion no core, 80m penumbra. 2D Echo EF 60-65%. No source of embolus   LDL 358 mg percent HgbA1c pending     VTE prophylaxis - Lovenox 40 mg sq daily  No antithrombotic prior to admission, received aspirin 81 10/27, now on Eliquis (apixaban) daily for secondary stroke prevention Therapy recommendations:  [facility]; 24/7 supervision Disposition:   Home    ROS:   14 system review of systems performed and negative with exception of those listed in HPI  PMH:  Past Medical History:  Diagnosis Date   [place]fib [place] 03/26/2020   Acute CVA (cerebrovascular accident) ([facility] 03/26/2020   Arthritis    Asthma    allergy induced   Bilateral carotid artery disease (HCC)    L carotid bruit   Bursitis    left hip   CAD (coronary artery disease)    Cancer ([place]    Dyslipidemia    intolerant to statins, welchol, niacin, zetia   Goals of care, counseling/discussion 10/11/2017   History of blood transfusion    History of nuclear stress test 04/24/2012   lexiscan; normal study   Hypertension    Hypothyroidism    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 ([place] 10/11/2017   Postoperative nausea and vomiting 01/02/2016    PSH:  Past Surgical History:  Procedure Laterality Date   ABDOMINAL HYSTERECTOMY     Carotid Doppler  02/2012   40-59% right int carotid artery stenosis; 60-79% L int carotid stenosis; L  carotid bruit   CORONARY ARTERY BYPASS GRAFT  03/12/2004   LIMA to LAD, SVG to circumflex, SVG to PDA   CYSTOSCOPY W/ URETERAL STENT PLACEMENT Left 12/06/2017   Procedure: CYSTOSCOPY WITH LEFT URETERAL STENT EXCHANGE;  Surgeon: WIrine Seal MD;  Location: [facility];  Service: Urology;  Laterality: Left;   CYSTOSCOPY WITH STENT PLACEMENT Bilateral 08/21/2017   Procedure: CYSTOSCOPY WITH STENT PLACEMENT;  Surgeon: WCeasar Mons MD;  Location: [facility];  Service: Urology;  Laterality: Bilateral;   IR FLUORO GUIDE PORT INSERTION RIGHT  10/11/2017   IR GENERIC HISTORICAL  04/29/2016   IR RADIOLOGIST EVAL & MGMT 04/29/2016 JSandi Mariscal MD [facility] INTERV RAD   IR GENERIC HISTORICAL  05/12/2016   IR RADIOLOGIST EVAL & MGMT 05/12/2016 JSandi Mariscal MD [facility] INTERV RAD   IR IVC FILTER PLMT / S&I /Burke KeelsGUID/MOD SED  10/11/2017   IR [place]ASC ACCESS RIGHT  10/11/2017   ROBOTIC ASSISTED TOTAL HYSTERECTOMY WITH BILATERAL SALPINGO OOPHERECTOMY Bilateral 12/30/2015   Procedure: XI ROBOTIC ASSISTED TOTAL HYSTERECTOMY WITH BILATERAL SALPINGO OOPHORECTOMY WITH SENTAL LYMPH NODE BIOPSY;  Surgeon: PNancy Marus  MD;  Location: [facility];  Service: Gynecology;  Laterality: Bilateral;   TONSILLECTOMY     TRANSTHORACIC ECHOCARDIOGRAM  04/2007   EF>55%; mild MR; mild-mod TR; mild pulm HTN; mild calcification of aortiv valve leaflets with mild valvular aortic stenosis   TUBAL LIGATION      Social History:  Social History   Socioeconomic History   Marital status: Widowed    Spouse name: Not on file   Number of children: 2   Years of education: Not on file   Highest education level: Not on file  Occupational History    Employer: RETIRED  Tobacco Use   Smoking status: Never   Smokeless tobacco: Never  Vaping Use   Vaping Use: Never used  Substance and Sexual Activity   Alcohol use: No   Drug use: No   Sexual activity: Not Currently  Other Topics Concern   Not on file  Social History Narrative   Not on  file   Social Determinants of Health   Financial Resource Strain: Not on file  Food Insecurity: Not on file  Transportation Needs: Not on file  Physical Activity: Not on file  Stress: Not on file  Social Connections: Not on file  Intimate Partner Violence: Not on file    Family History:  Family History  Problem Relation Age of Onset   Hypertension Mother    Heart disease Mother        Died in her 70s   Stroke Father    Kidney disease Brother    Heart disease Brother        also HTN, hyperlipidemia   Heart attack Brother    Stroke Sister        x2   Hypertension Sister     Medications:   Current Outpatient Medications on File Prior to Visit  Medication Sig Dispense Refill   acetaminophen (TYLENOL) 500 MG tablet Take 500 mg by mouth as needed (pain).     apixaban (ELIQUIS) 5 MG TABS tablet Take 1 tablet (5 mg total) by mouth 2 (two) times daily. 60 tablet 0   clobetasol ointment (TEMOVATE) 0.35 % Apply 1 application topically 3 (three) times a week. 30 g 0   conjugated estrogens (PREMARIN) vaginal cream Place 1 Applicatorful vaginally 3 (three) times a week. Apply nightly for 2 weeks, then 3 times a week. 42.5 g 1   diltiazem (TIAZAC) 300 MG 24 hr capsule TAKE 1 CAPSULE(300 MG) BY MOUTH DAILY (Patient taking differently: Take 300 mg by mouth at bedtime.) 30 capsule 1   Evolocumab (REPATHA SURECLICK) 465 MG/ML SOAJ Inject 1 Dose into the skin every 14 (fourteen) days. 2 mL 11   levothyroxine (SYNTHROID, LEVOTHROID) 50 MCG tablet Take 1 tablet (50 mcg total) by mouth daily before breakfast. 30 tablet 1   lidocaine (XYLOCAINE) 5 % ointment Apply 1 application topically as needed. Up to TID for treatment of burning symptoms of the vulva 35.44 g 0   lidocaine-prilocaine (EMLA) cream Apply a dime size to port-a-cath 1-2 hours prior to access. Cover with Saran wrap. 30 g 2   mometasone (ELOCON) 0.1 % cream Apply 1 application topically daily. Use nightly for 1-2 weeks, then use every  other night 45 g 0   polyethylene glycol (MIRALAX / GLYCOLAX) 17 g packet Take 17 g by mouth daily as needed for severe constipation. 14 each 0   traMADol (ULTRAM) 50 MG tablet TAKE 2 TABLETS(100 MG) BY MOUTH TWICE DAILY 120 tablet 2   Current  Facility-Administered Medications on File Prior to Visit  Medication Dose Route Frequency Provider Last Rate Last Admin   sodium chloride flush (NS) 0.9 % injection 10 mL  10 mL Intracatheter PRN Volanda Napoleon, MD   10 mL at 02/05/20 1440    Allergies:   Allergies  Allergen Reactions   Statins Other (See Comments)    Myalgias and memory problems   Ciprofloxacin Itching    Splotchy redness with itching during IV infusion localized to arm.      OBJECTIVE:  Physical Exam  Vitals:   12/31/20 1237  BP: (!) 158/76  Pulse: 64  Weight: 141 lb (64 kg)  Height: 5' 1"  (1.549 m)    Body mass index is 26.64 kg/m. No results found.  General: Frail very pleasant elderly Caucasian female, seated, in no evident distress Head: head normocephalic and atraumatic.   Neck: supple with no carotid or supraclavicular bruits Cardiovascular: regular rate and rhythm, no murmurs Musculoskeletal: no deformity Skin:  no rash/petichiae; chronic BLE lymphedema Vascular:  Normal pulses all extremities   Neurologic Exam Mental Status: Awake and fully alert.  Fluent speech and language.  No evidence of receptive aphasia.  Oriented to place and time. Recent memory subjectively impaired and remote memory intact. Attention span, concentration and fund of knowledge appropriate during visit. Mood and affect appropriate.  Cranial Nerves: Pupils equal, briskly reactive to light. Extraocular movements full without nystagmus. Visual fields full to confrontation.  HOH bilaterally. Facial sensation intact. Face, tongue, palate moves normally and symmetrically.  Motor: Normal bulk and tone. Normal strength in all tested extremity muscles  Sensory.: intact to touch ,  pinprick , position and vibratory sensation.  Coordination: Rapid alternating movements normal in all extremities. Finger-to-nose and heel-to-shin performed accurately bilaterally. Gait and Station: Arises from chair without difficulty. Stance is normal. Gait demonstrates decreased stride length and step height with mild imbalance and use of cane.  Tandem walk and heel toe not attempted Reflexes: 1+ and symmetric. Toes downgoing.         ASSESSMENT: Shannon Scott is a 80 y.o. year old female presented with expressive aphasia, right-sided headache and neck pain on 03/26/2020 with stroke work-up revealing left MCA infarct, embolic secondary to known AF not on AC. Vascular risk factors include atrial fibrillation, prior strokes on imaging, uncontrolled HTN, HLD with history of statin intolerance, advanced age and CAD.  Recent transient episode of upper and lower extremity shaking, aphasia and dysarthria, headache and vomiting on 12/18/2020 evaluated at [facility] ED - dx'd with recrudescence of prior stroke symptoms in setting of severe vomiting     PLAN:  L MCA stroke :  Residual deficit: Greatly recovered with mild cognitive impairment Continue Eliquis (apixaban) daily for secondary stroke prevention.   Discussed secondary stroke prevention measures and importance of close PCP follow up for aggressive stroke risk factor management  Recent event (as above): Unknown etiology but likely induced by severe vomiting and hypertensive emergency. CTH unremarkable.  Discussed potentially obtaining EEG to rule out possible seizures with history of prior stroke although lower suspicion - will further consider and call office if she wishes to pursue.  Continue to monitor. A. Fib: On Eliquis for CHA2DS2-VASc score at least 7 routinely followed by cardiology HTN: BP goal <130/90.  Stable managed by PCP HLD: LDL goal <70.  Most recent LDL 198 (08/2020) down from 358 on Repatha managed by cardiology.       Follow up in 6 months or call earlier if  needed   CC:  [facility] provider: Dr. Princella Pellegrini, MD    I spent 38 minutes of face-to-face and non-face-to-face time with patient and son.  This included previsit chart review including recent hospitalization, lab review, study review, electronic health record documentation, patient education regarding recent event and possible etiologies, history of stroke including etiology and indication for [facility], secondary stroke prevention measures and importance of managing stroke risk factors and answered all questions to patient and sons satisfaction   Frann Rider, AGNP-BC  [facility] [address] [place],  [address]  Phone [phone] Fax [phone] Note: This document was prepared with digital dictation and possible smart phrase technology. Any transcriptional errors that result from this process are unintentional.

## 2020-12-31 NOTE — Patient Instructions (Addendum)
Continue Eliquis (apixaban) daily  and Repatha for secondary stroke prevention  Continue to follow with cardiology routinely  Continue to follow up with PCP/cardiology regarding cholesterol and blood pressure management  Maintain strict control of hypertension with blood pressure goal below 130/90 and cholesterol with LDL cholesterol (bad cholesterol) goal below 70 mg/dL.   Consider doing EEG to see if your at an increased risk of seizures that could have contributed to your recent event - if you would like to proceed, please let me know      Followup in the future with me in 6 months or call earlier if needed       Thank you for coming to see [place] at [facility]. I hope we have been able to provide you high quality care today.  You may receive a patient satisfaction survey over the next few weeks. We would appreciate your feedback and comments so that we may continue to improve ourselves and the health of our patients.

## 2021-01-01 NOTE — Progress Notes (Signed)
I agree with the above plan 

## 2021-01-02 ENCOUNTER — Inpatient Hospital Stay: Payer: Medicare Other | Attending: Hematology & Oncology | Admitting: Gynecologic Oncology

## 2021-01-02 ENCOUNTER — Encounter: Payer: Self-pay | Admitting: Gynecologic Oncology

## 2021-01-02 ENCOUNTER — Other Ambulatory Visit: Payer: Self-pay

## 2021-01-02 VITALS — BP 148/64 | HR 69 | Temp 97.7°F | Resp 18 | Ht 61.0 in | Wt 138.8 lb

## 2021-01-02 DIAGNOSIS — Z9221 Personal history of antineoplastic chemotherapy: Secondary | ICD-10-CM | POA: Insufficient documentation

## 2021-01-02 DIAGNOSIS — Z8542 Personal history of malignant neoplasm of other parts of uterus: Secondary | ICD-10-CM | POA: Insufficient documentation

## 2021-01-02 DIAGNOSIS — Z923 Personal history of irradiation: Secondary | ICD-10-CM | POA: Insufficient documentation

## 2021-01-02 DIAGNOSIS — I2510 Atherosclerotic heart disease of native coronary artery without angina pectoris: Secondary | ICD-10-CM | POA: Insufficient documentation

## 2021-01-02 DIAGNOSIS — I4891 Unspecified atrial fibrillation: Secondary | ICD-10-CM | POA: Insufficient documentation

## 2021-01-02 DIAGNOSIS — Z8673 Personal history of transient ischemic attack (TIA), and cerebral infarction without residual deficits: Secondary | ICD-10-CM | POA: Diagnosis not present

## 2021-01-02 DIAGNOSIS — N9089 Other specified noninflammatory disorders of vulva and perineum: Secondary | ICD-10-CM | POA: Insufficient documentation

## 2021-01-02 DIAGNOSIS — Z90722 Acquired absence of ovaries, bilateral: Secondary | ICD-10-CM | POA: Diagnosis not present

## 2021-01-02 DIAGNOSIS — E039 Hypothyroidism, unspecified: Secondary | ICD-10-CM | POA: Diagnosis not present

## 2021-01-02 DIAGNOSIS — I10 Essential (primary) hypertension: Secondary | ICD-10-CM | POA: Diagnosis not present

## 2021-01-02 DIAGNOSIS — Z951 Presence of aortocoronary bypass graft: Secondary | ICD-10-CM | POA: Diagnosis not present

## 2021-01-02 DIAGNOSIS — Z90710 Acquired absence of both cervix and uterus: Secondary | ICD-10-CM | POA: Insufficient documentation

## 2021-01-02 DIAGNOSIS — Z7901 Long term (current) use of anticoagulants: Secondary | ICD-10-CM | POA: Insufficient documentation

## 2021-01-02 DIAGNOSIS — Z79899 Other long term (current) drug therapy: Secondary | ICD-10-CM | POA: Insufficient documentation

## 2021-01-02 DIAGNOSIS — C541 Malignant neoplasm of endometrium: Secondary | ICD-10-CM

## 2021-01-02 NOTE — Progress Notes (Signed)
Gynecologic Oncology Return Clinic Visit  01/02/2021  Reason for Visit: Follow-up vulvar symptoms  Treatment History: Oncology History Overview Note  The patient began noticing postmenopausal bleeding in June, 2017. She saw her primary care physician who determined there was no blood in the urine, and she was then referred to Dr Benjie Karvonen who, on 11/21/15 performed a TVUS which showed a uterus measuring 6x2.7x3.3cm with normal ovaries. There was a thickened endometrium of 6.52m.    [place]  11/21/2015 Initial Diagnosis   [place] A pipelle endometrial biopsy was performed on 11/21/15 which showed FIGO grade 1-2 endometrial cancer.  The pap from the same date was normal.   12/30/2015 Surgery   Robotic assisted total hysterectomy, BSO, pelvic and PA SLN biopsy. 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  Postoperative Diagnosis: same.  Surgery: Total robotic hysterectomy bilateral salpingo-oophorectomy, left pelvic, right para-aortic and bifurcation SLN removal  Operative findings:  1) Colon adherent to posterior uterus, left sidewall and appendix 2) 2 right para-aortic nodes for SLN (high and PA node) 3) Aortic bifurcation nodes 4) Left obturator node  Diagnosis 1. Lymph node, sentinel, biopsy, right obturator - ONE BENIGN LYMPH NODE (0/1). 2. Lymph node, sentinel, biopsy, right peri-aortic - ONE BENIGN LYMPH NODE (0/1). 3. Lymph node, sentinel, biopsy, right lower peri-aortic - ONE BENIGN LYMPH NODE (0/1). 4. Lymph node, sentinel, biopsy, aortic bifurcation - ONE BENIGN LYMPH NODE (0/1). 5. Lymph node, sentinel, biopsy, left obturator - ONE BENIGN LYMPH NODE (0/1). 6. Uterus +/- tubes/ovaries, neoplastic - ENDOMETRIAL ADENOCARCINOMA, 1.7 CM WITH SUPERFICIAL MYOMETRIAL INVASION. - MARGINS NOT INVOLVED. - CERVIX, BILATERAL OVARIES AND BILATERAL FALLOPIAN TUBES FREE OF TUMOR. Microscopic Comment 6. ONCOLOGY TABLE-UTERUS,  CARCINOMA OR CARCINOSARCOMA Specimen: Uterus with bilateral fallopian tubes and ovaries and sentinel lymph node biopsies. Procedure: Hysterectomy with sentinel lymph nodes. Lymph node sampling performed: Yes. Specimen integrity: Intact. Maximum tumor size: 1.7 cm Histologic type: Endometrioid with squamous differentiation. Grade: 2 Myometrial invasion: 0.3 cm where myometrium is 1 cm in thickness Cervical stromal involvement: No. Extent of involvement of other organs: None, identified. Lymph - vascular invasion: Not identified.    05/13/2016 Imaging   05/03/16 CT guided drainage: IMPRESSION: Successful CT guided aspiration of approximately 15 cc of serous, slightly cloudy / milky fluid from the residual collection within the left hemipelvis. All aspirated samples were sent to the laboratory for cytologic and gram stain analysis.  Cytology was negative   07/2017 Progression   Shannon SCHURMANin February 2019 presented to her primary physician with complaints of rectal bleeding.  She was scheduled for a colonoscopy and had taken the bowel prep when she presented with obstruction of her distal sigmoid colon.   08/18/2017 Imaging   maging 08/18/2017 The left ureter is dilated to the left hemipelvis. It becomes narrowed in the inflammatory pelvic mass. This is described below. Bladder is within normal limits.   Stomach/Bowel: Large hiatal hernia is unchanged. Stable prominent gastric folds within the hiatal hernia.   The colon is diffusely distended. Distal small bowel is distended. A transition point between dilated large bowel and decompressed large bowel occurs in the sigmoid colon. There is a heterogeneous ill-defined mass involving the sigmoid colon and left side of the pelvis which includes the colon wall, adjacent fat, and both fluid and gas elements. Findings are suspicious for a focal perforation with abscess formation. Underlying malignancy is a strong consideration given the  appearance and colon obstruction. The mass also involves the  left ureter causing an element of left ureteral dilatation worrisome for an element of left ureteral obstruction. The gas and fluid collection is very small measuring 1.8 x 1.2 cm on  image 109 of series 4. Overall mass size including the involved colon is 5.7 x 5.3 cm.    08/21/2017 Surgery   Shannon Scott underwent a diverting laparoscopic transverse loop colostomy and subsequent placement of a left ureteral stent.   08/2017 -  Radiation Therapy   She then received pelvic external beam radiation therapy    10/13/2017 - 11/17/2017 Chemotherapy   The patient had dexamethasone (DECADRON) 4 MG tablet, 8 mg, Oral, Daily, 1 of 1 cycle, Start date: --, End date: -- palonosetron (ALOXI) injection 0.25 mg, 0.25 mg, Intravenous,  Once, 6 of 6 cycles Administration: 0.25 mg (10/13/2017), 0.25 mg (10/20/2017), 0.25 mg (11/03/2017), 0.25 mg (11/10/2017), 0.25 mg (11/17/2017) CARBOplatin (PARAPLATIN) 150 mg in sodium chloride 0.9 % 100 mL chemo infusion, 150 mg (100 % of original dose 148 mg), Intravenous,  Once, 6 of 6 cycles Dose modification:   (original dose 148 mg, Cycle 1) Administration: 150 mg (10/13/2017), 150 mg (10/20/2017), 150 mg (11/03/2017), 150 mg (11/10/2017), 150 mg (11/17/2017) PACLitaxel (TAXOL) 132 mg in sodium chloride 0.9 % 250 mL chemo infusion (</= 11m/m2), 80 mg/m2 = 132 mg, Intravenous,  Once, 6 of 6 cycles Dose modification: 45 mg/m2 (original dose 80 mg/m2, Cycle 2, Reason: Other (see comments), Comment: Decreasing dose to 45 mg/m2 while undergoing XRT) Administration: 132 mg (10/13/2017), 72 mg (10/20/2017), 72 mg (11/03/2017), 72 mg (11/10/2017), 72 mg (11/17/2017)   for chemotherapy treatment.     01/18/2018 Imaging   notable for the presence of an IVC filter and 11 mm left internal iliac node that was previously 1.6 cm and a soft tissue mass in the left pelvic sidewall and now measuring 3.9 x 2.5 cm.   03/03/2018 Imaging    PET  demonstrates a hypermetabolic left pelvic sidewall mass, greater than 3 weeks after completion of radiotherapy.   03/16/2018 -  Chemotherapy    Patient is on Treatment Plan: COLORECTAL PEMBROLIZUMAB Q21D       04/2018 Miscellaneous    CA 125  Ref. Range 05/04/2018 12:08 05/25/2018 11:21 06/15/2018 13:30 10/10/2018 12:20 01/23/2019 11:35  Cancer Antigen (CA) 125 Latest Ref Range: 0.0 - 38.1 U/mL 11.2 10.9 10.2 10.9 9.2    10/05/2018 Imaging   PET  3. Continued positive response to therapy within the left pelvic sidewall mass, which demonstrates residual low level hypermetabolism that has mildly decreased. 4. Resolved mediastinal nodal hypermetabolism. Nearly resolved bilateral hilar nodal hypermetabolism. These findings could represent resolving reactive nodes versus response to therapy within metastatic nodes. 5. No metabolic evidence of new sites of metastatic disease.   09/27/2019 Imaging   CT C/A/P: IMPRESSION: 1. Since the PET of 10/05/2018, similar to slight decrease in left pelvic sidewall soft tissue fullness, without well-defined mass. 2. No findings of  new or progressive disease. 3. Chronic or recurrent left renal pelvic and proximal ureteric wall thickening/mucosal hyperenhancement. Nonspecific, especially in the setting of a prior stent. Cannot exclude ascending infection. Similarly, improved bladder wall and surrounding edema which could simply be treatment related. Correlate with urinalysis to exclude cystitis. 4. Small hiatal hernia. 5. Transverse colostomy with similar small bowel containing parastomal hernia. 6.  Aortic Atherosclerosis (ICD10-I70.0).   03/17/2020 Imaging   CT C/A/P: 1. No evidence of cervical cancer local recurrence or metastatic disease. 2. LEFT midline colostomy with peristomal  hernia. Long segment small bowel enters the hernia sac without obstruction. 3. Chronic fractures sacral insufficiency fractures   06/03/2020 Imaging   CT  C/A/P: 1. Status post hysterectomy and bilateral oophorectomy. No findings for recurrent tumor, regional lymphadenopathy or metastatic disease. 2. Stable loop colostomy with sizable parastomal hernia containing small bowel and colon. No obstructive findings. 3. Stable healed sacral insufficiency fractures and remote partially united fracture involving the right pubic symphysis. 4. Advanced atherosclerotic calcifications involving the thoracic and abdominal aorta and branch vessels including the coronary arteries. 5. Aortic atherosclerosis.   10/01/2020 Imaging   CT C/A/P: IMPRESSION: 1. Status post hysterectomy. No evidence of recurrent or metastatic disease in the chest, abdomen, or pelvis. 2. Status post diverting transverse loop colostomy with a large midline parastomal hernia containing multiple nonobstructed loops of transverse colon and small bowel. 3. Unchanged thickening of the urinary bladder wall, likely due to pelvic radiation. 4. Unchanged sclerotic insufficiency fractures of the bilateral sacral ala. Unchanged chronic fracture deformity of the right pubic symphysis. 5. Cholelithiasis. 6. Coronary artery disease.     Interval History: Patient ports doing well since her last visit with me.  She was seen in the emergency department in July after presenting with slurred speech with aphasia, headache, and upper and lower extremity shaking with blood pressure noted to be over 200/100.  Evaluation by neurology at that point thought that she had prior strokelike symptoms with hypertensive urgency.  CT of her head was negative for acute findings and symptoms quickly resolved.  From a vulvar standpoint, the patient has been using her new steroid cream every other night.  She notes complete resolution of vulvar pain, dysuria, and pruritus.  She denies any vaginal bleeding or discharge.  Last night, she noticed an area of what she thought was mucus on her posterior vulva.  She rubbed  the spot vigorously to remove the mucus and now has some tenderness and pain after urinating.  The patient saw Dr. Marin Olp since her last visit with me.  Plan is for treatment break to continue.  Defer to him in terms of follow-up imaging in the setting of her recurrent disease.  Past Medical/Surgical History: Past Medical History:  Diagnosis Date   A-fib ([place]) 03/26/2020   Acute CVA (cerebrovascular accident) ([place]) 03/26/2020   Arthritis    Asthma    allergy induced   Bilateral carotid artery disease (H[facility])    L carotid bruit   Bursitis    left hip   CAD (coronary artery disease)    Cancer ([place])    Dyslipidemia    intolerant to statins, welchol, niacin, zetia   Goals of care, counseling/discussion 10/11/2017   History of blood transfusion    History of nuclear stress test 04/24/2012   lexiscan; normal study   Hypertension    Hypothyroidism    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 ([place]) 10/11/2017   Postoperative nausea and vomiting 01/02/2016    Past Surgical History:  Procedure Laterality Date   ABDOMINAL HYSTERECTOMY     Carotid Doppler  02/2012   40-59% right int carotid artery stenosis; 60-79% L int carotid stenosis; L carotid bruit   CORONARY ARTERY BYPASS GRAFT  03/12/2004   LIMA to LAD, SVG to circumflex, SVG to PDA   CYSTOSCOPY W/ URETERAL STENT PLACEMENT Left 12/06/2017   Procedure: CYSTOSCOPY WITH LEFT URETERAL STENT EXCHANGE;  Surgeon: Irine Seal, MD;  Location: [facility];  Service: Urology;  Laterality: Left;   CYSTOSCOPY WITH  STENT PLACEMENT Bilateral 08/21/2017   Procedure: CYSTOSCOPY WITH STENT PLACEMENT;  Surgeon: Ceasar Mons, MD;  Location: [facility];  Service: Urology;  Laterality: Bilateral;   IR FLUORO GUIDE PORT INSERTION RIGHT  10/11/2017   IR GENERIC HISTORICAL  04/29/2016   IR RADIOLOGIST EVAL & MGMT 04/29/2016 Sandi Mariscal, MD [facility]   IR GENERIC HISTORICAL  05/12/2016   IR RADIOLOGIST EVAL &  MGMT 05/12/2016 Sandi Mariscal, MD [facility]   IR IVC FILTER PLMT / S&I Burke Keels GUID/MOD SED  10/11/2017   IR US GUIDE VASC ACCESS RIGHT  10/11/2017   ROBOTIC ASSISTED TOTAL HYSTERECTOMY WITH BILATERAL SALPINGO OOPHERECTOMY Bilateral 12/30/2015   Procedure: XI ROBOTIC ASSISTED TOTAL HYSTERECTOMY WITH BILATERAL SALPINGO OOPHORECTOMY WITH SENTAL LYMPH NODE BIOPSY;  Surgeon: Nancy Marus, MD;  Location: [facility];  Service: Gynecology;  Laterality: Bilateral;   TONSILLECTOMY     TRANSTHORACIC ECHOCARDIOGRAM  04/2007   EF>55%; mild MR; mild-mod TR; mild pulm HTN; mild calcification of aortiv valve leaflets with mild valvular aortic stenosis   TUBAL LIGATION      Family History  Problem Relation Age of Onset   Hypertension Mother    Heart disease Mother        Died in her 63s   Stroke Father    Kidney disease Brother    Heart disease Brother        also HTN, hyperlipidemia   Heart attack Brother    Stroke Sister        x2   Hypertension Sister     Social History   Socioeconomic History   Marital status: Widowed    Spouse name: Not on file   Number of children: 2   Years of education: Not on file   Highest education level: Not on file  Occupational History    Employer: RETIRED  Tobacco Use   Smoking status: Never   Smokeless tobacco: Never  Vaping Use   Vaping Use: Never used  Substance and Sexual Activity   Alcohol use: No   Drug use: No   Sexual activity: Not Currently  Other Topics Concern   Not on file  Social History Narrative   Not on file   Social Determinants of Health   Financial Resource Strain: Not on file  Food Insecurity: Not on file  Transportation Needs: Not on file  Physical Activity: Not on file  Stress: Not on file  Social Connections: Not on file    Current Medications:  Current Outpatient Medications:    acetaminophen (TYLENOL) 500 MG tablet, Take 500 mg by mouth as needed (pain)., Disp: , Rfl:    apixaban (ELIQUIS) 5 MG TABS tablet, Take 1  tablet (5 mg total) by mouth 2 (two) times daily., Disp: 60 tablet, Rfl: 0   clobetasol ointment (TEMOVATE) 8.11 %, Apply 1 application topically 3 (three) times a week., Disp: 30 g, Rfl: 0   conjugated estrogens (PREMARIN) vaginal cream, Place 1 Applicatorful vaginally 3 (three) times a week. Apply nightly for 2 weeks, then 3 times a week., Disp: 42.5 g, Rfl: 1   diltiazem (TIAZAC) 300 MG 24 hr capsule, TAKE 1 CAPSULE(300 MG) BY MOUTH DAILY (Patient taking differently: Take 300 mg by mouth at bedtime.), Disp: 30 capsule, Rfl: 1   Evolocumab (REPATHA SURECLICK) 914 MG/ML SOAJ, Inject 1 Dose into the skin every 14 (fourteen) days., Disp: 2 mL, Rfl: 11   levothyroxine (SYNTHROID, LEVOTHROID) 50 MCG tablet, Take 1 tablet (50 mcg total) by mouth daily  before breakfast., Disp: 30 tablet, Rfl: 1   lidocaine (XYLOCAINE) 5 % ointment, Apply 1 application topically as needed. Up to TID for treatment of burning symptoms of the vulva, Disp: 35.44 g, Rfl: 0   lidocaine-prilocaine (EMLA) cream, Apply a dime size to port-a-cath 1-2 hours prior to access. Cover with Saran wrap., Disp: 30 g, Rfl: 2   mometasone (ELOCON) 0.1 % cream, Apply 1 application topically daily. Use nightly for 1-2 weeks, then use every other night, Disp: 45 g, Rfl: 0   polyethylene glycol (MIRALAX / GLYCOLAX) 17 g packet, Take 17 g by mouth daily as needed for severe constipation., Disp: 14 each, Rfl: 0   traMADol (ULTRAM) 50 MG tablet, TAKE 2 TABLETS(100 MG) BY MOUTH TWICE DAILY, Disp: 120 tablet, Rfl: 2 No current facility-administered medications for this visit.  Facility-Administered Medications Ordered in Other Visits:    sodium chloride flush (NS) 0.9 % injection 10 mL, 10 mL, Intracatheter, PRN, Volanda Napoleon, MD, 10 mL at 02/05/20 1440  Review of Systems: Pertinent positives include joint pain, back pain, and left leg swelling. Denies appetite changes, fevers, chills, fatigue, unexplained weight changes. Denies hearing loss,  neck lumps or masses, mouth sores, ringing in ears or voice changes. Denies cough or wheezing.  Denies shortness of breath. Denies chest pain or palpitations.  Denies abdominal distention, pain, blood in stools, constipation, diarrhea, nausea, vomiting, or early satiety. Denies pain with intercourse, dysuria, frequency, hematuria or incontinence. Denies hot flashes, pelvic pain, vaginal bleeding or vaginal discharge.   Denies muscle pain/cramps. Denies itching, rash, or wounds. Denies dizziness, headaches, numbness or seizures. Denies swollen lymph nodes or glands, denies easy bruising or bleeding. Denies anxiety, depression, confusion, or decreased concentration.  Physical Exam: BP (!) 148/64 (BP Location: Left Arm, Patient Position: Sitting)   Pulse 69   Temp 97.7 F (36.5 C) (Tympanic)   Resp 18   Ht 5' 1"  (1.549 m)   Wt 138 lb 12.8 oz (63 kg)   SpO2 99%   BMI 26.23 kg/m  General: Alert, oriented, no acute distress. HEENT: Atraumatic, normocephalic, sclera anicteric. Chest: Unlabored breathing on room air. Extremities: Grossly normal range of motion.  Warm, well perfused.  As per her baseline, the patient has increased left lower extremity edema, 1-2+ today with some mild erythema of the anterior lower leg.  There is trace edema of the right lower extremity. Lymphatics: No cervical, supraclavicular, or inguinal adenopathy. GU: External female genitalia notable for significant improvement in erythema of bilateral vulva.  There are several smaller areas of desquamation that looked partially healed now on the right inner labia.  There is about a 1.5 cm area of mild desquamation along the posterior left labia where patient endorses tenderness since yesterday.    Laboratory & Radiologic Studies: None new  Assessment & Plan: Shannon Scott is a 80 y.o. woman with recurrent endometrial cancer, imaging continues to be negative for any evidence of disease.  She had been on  pembrolizumab for maintenance after recurrence although has been off treatment since September.  She presents today for continued vulvar symptoms.  The patient's exam has improved significantly on a lower potency topical steroid.  Other than recent onset of symptoms, the patient has been relatively asymptomatic since I last saw her.  We will continue with every other night use of the topical steroid.  We also discussed using an emollient or skin barrier such as Vaseline in between steroid uses.  I will plan to see her  back in 3 months to assess symptoms as well as her exam.  From an endometrial cancer standpoint, the patient had follow-up with Dr. Marin Olp in July.  She is seeing him next in October.  Plan is for continued treatment break.  Most recent imaging was in May of this year negative for evidence of recurrent disease.  32 minutes of total time was spent for this patient encounter, including preparation, face-to-face counseling with the patient and coordination of care, and documentation of the encounter.  Jeral Pinch, MD  Division of Gynecologic Oncology  Department of Obstetrics and Gynecology  [facility]

## 2021-01-02 NOTE — Patient Instructions (Signed)
It was good to see you today!  I am glad to hear that your symptoms have improved.  Your bottom looks much better with her new regimen.  Keep using the steroid cream as you have been.  I will see back in 3 months.  If you need a refill of the cream before your next visit, call the clinic at [phone].

## 2021-01-05 DIAGNOSIS — Z933 Colostomy status: Secondary | ICD-10-CM | POA: Diagnosis not present

## 2021-01-05 DIAGNOSIS — K56609 Unspecified intestinal obstruction, unspecified as to partial versus complete obstruction: Secondary | ICD-10-CM | POA: Diagnosis not present

## 2021-01-26 DIAGNOSIS — K56609 Unspecified intestinal obstruction, unspecified as to partial versus complete obstruction: Secondary | ICD-10-CM | POA: Diagnosis not present

## 2021-01-26 DIAGNOSIS — Z933 Colostomy status: Secondary | ICD-10-CM | POA: Diagnosis not present

## 2021-01-27 DIAGNOSIS — K56609 Unspecified intestinal obstruction, unspecified as to partial versus complete obstruction: Secondary | ICD-10-CM | POA: Diagnosis not present

## 2021-01-27 DIAGNOSIS — Z933 Colostomy status: Secondary | ICD-10-CM | POA: Diagnosis not present

## 2021-03-02 DIAGNOSIS — K56609 Unspecified intestinal obstruction, unspecified as to partial versus complete obstruction: Secondary | ICD-10-CM | POA: Diagnosis not present

## 2021-03-02 DIAGNOSIS — Z933 Colostomy status: Secondary | ICD-10-CM | POA: Diagnosis not present

## 2021-03-04 ENCOUNTER — Inpatient Hospital Stay: Payer: Medicare Other

## 2021-03-04 ENCOUNTER — Inpatient Hospital Stay (HOSPITAL_BASED_OUTPATIENT_CLINIC_OR_DEPARTMENT_OTHER): Payer: Medicare Other | Admitting: Hematology & Oncology

## 2021-03-04 ENCOUNTER — Other Ambulatory Visit: Payer: Self-pay

## 2021-03-04 ENCOUNTER — Inpatient Hospital Stay: Payer: Medicare Other | Attending: Hematology & Oncology

## 2021-03-04 ENCOUNTER — Encounter: Payer: Self-pay | Admitting: Hematology & Oncology

## 2021-03-04 VITALS — BP 159/50 | HR 68 | Temp 98.0°F | Resp 16 | Wt 146.0 lb

## 2021-03-04 DIAGNOSIS — C799 Secondary malignant neoplasm of unspecified site: Secondary | ICD-10-CM

## 2021-03-04 DIAGNOSIS — Z7901 Long term (current) use of anticoagulants: Secondary | ICD-10-CM | POA: Insufficient documentation

## 2021-03-04 DIAGNOSIS — D509 Iron deficiency anemia, unspecified: Secondary | ICD-10-CM | POA: Diagnosis not present

## 2021-03-04 DIAGNOSIS — R609 Edema, unspecified: Secondary | ICD-10-CM | POA: Insufficient documentation

## 2021-03-04 DIAGNOSIS — D508 Other iron deficiency anemias: Secondary | ICD-10-CM

## 2021-03-04 DIAGNOSIS — Z933 Colostomy status: Secondary | ICD-10-CM | POA: Diagnosis not present

## 2021-03-04 DIAGNOSIS — Z79899 Other long term (current) drug therapy: Secondary | ICD-10-CM | POA: Diagnosis not present

## 2021-03-04 DIAGNOSIS — Z8542 Personal history of malignant neoplasm of other parts of uterus: Secondary | ICD-10-CM | POA: Insufficient documentation

## 2021-03-04 DIAGNOSIS — C539 Malignant neoplasm of cervix uteri, unspecified: Secondary | ICD-10-CM

## 2021-03-04 LAB — CBC WITH DIFFERENTIAL (CANCER CENTER ONLY)
Abs Immature Granulocytes: 0.01 10*3/uL (ref 0.00–0.07)
Basophils Absolute: 0 10*3/uL (ref 0.0–0.1)
Basophils Relative: 1 %
Eosinophils Absolute: 0.1 10*3/uL (ref 0.0–0.5)
Eosinophils Relative: 2 %
HCT: 34.2 % — ABNORMAL LOW (ref 36.0–46.0)
Hemoglobin: 11.2 g/dL — ABNORMAL LOW (ref 12.0–15.0)
Immature Granulocytes: 0 %
Lymphocytes Relative: 12 %
Lymphs Abs: 0.7 10*3/uL (ref 0.7–4.0)
MCH: 29.4 pg (ref 26.0–34.0)
MCHC: 32.7 g/dL (ref 30.0–36.0)
MCV: 89.8 fL (ref 80.0–100.0)
Monocytes Absolute: 0.4 10*3/uL (ref 0.1–1.0)
Monocytes Relative: 7 %
Neutro Abs: 4.5 10*3/uL (ref 1.7–7.7)
Neutrophils Relative %: 78 %
Platelet Count: 279 10*3/uL (ref 150–400)
RBC: 3.81 MIL/uL — ABNORMAL LOW (ref 3.87–5.11)
RDW: 13.7 % (ref 11.5–15.5)
WBC Count: 5.7 10*3/uL (ref 4.0–10.5)
nRBC: 0 % (ref 0.0–0.2)

## 2021-03-04 LAB — RETICULOCYTES
Immature Retic Fract: 4.8 % (ref 2.3–15.9)
RBC.: 3.77 MIL/uL — ABNORMAL LOW (ref 3.87–5.11)
Retic Count, Absolute: 45.6 10*3/uL (ref 19.0–186.0)
Retic Ct Pct: 1.2 % (ref 0.4–3.1)

## 2021-03-04 LAB — IRON AND TIBC
Iron: 34 ug/dL — ABNORMAL LOW (ref 41–142)
Saturation Ratios: 18 % — ABNORMAL LOW (ref 21–57)
TIBC: 191 ug/dL — ABNORMAL LOW (ref 236–444)
UIBC: 158 ug/dL (ref 120–384)

## 2021-03-04 LAB — LACTATE DEHYDROGENASE: LDH: 121 U/L (ref 98–192)

## 2021-03-04 LAB — CMP (CANCER CENTER ONLY)
ALT: 7 U/L (ref 0–44)
AST: 12 U/L — ABNORMAL LOW (ref 15–41)
Albumin: 3.9 g/dL (ref 3.5–5.0)
Alkaline Phosphatase: 84 U/L (ref 38–126)
Anion gap: 7 (ref 5–15)
BUN: 15 mg/dL (ref 8–23)
CO2: 28 mmol/L (ref 22–32)
Calcium: 9.3 mg/dL (ref 8.9–10.3)
Chloride: 104 mmol/L (ref 98–111)
Creatinine: 0.81 mg/dL (ref 0.44–1.00)
GFR, Estimated: >60 mL/min (ref >60)
Glucose, Bld: 100 mg/dL — ABNORMAL HIGH (ref 70–99)
Potassium: 3.8 mmol/L (ref 3.5–5.1)
Sodium: 139 mmol/L (ref 135–145)
Total Bilirubin: 0.3 mg/dL (ref 0.3–1.2)
Total Protein: 6.8 g/dL (ref 6.5–8.1)

## 2021-03-04 LAB — FERRITIN: Ferritin: 463 ng/mL — ABNORMAL HIGH (ref 11–307)

## 2021-03-04 MED ORDER — HEPARIN SOD (PORK) LOCK FLUSH 100 UNIT/ML IV SOLN
500.0000 [IU] | Freq: Once | INTRAVENOUS | Status: AC | PRN
  Administered 2021-03-04: 500 [IU]

## 2021-03-04 MED ORDER — SODIUM CHLORIDE 0.9 % IV SOLN: Freq: Once | INTRAVENOUS | Status: AC

## 2021-03-04 MED ORDER — ZOLEDRONIC ACID 4 MG/100ML IV SOLN
4.0000 mg | Freq: Once | INTRAVENOUS | Status: AC
  Administered 2021-03-04: 4 mg via INTRAVENOUS
  Filled 2021-03-04: qty 100

## 2021-03-04 MED ORDER — SODIUM CHLORIDE 0.9% FLUSH
10.0000 mL | Freq: Once | INTRAVENOUS | Status: AC | PRN
  Administered 2021-03-04: 10 mL

## 2021-03-04 NOTE — Patient Instructions (Signed)
Implanted [facility] Guide An implanted port is a device that is placed under the skin. It is usually placed in the chest. The device can be used to give IV medicine, to take blood, or for dialysis. You may have an implanted port if: You need IV medicine that would be irritating to the small veins in your hands or arms. You need IV medicines, such as antibiotics, for a long period of time. You need IV nutrition for a long period of time. You need dialysis. When you have a port, your health care provider can choose to use the port instead of veins in your arms for these procedures. You may have fewer limitations when using a port than you would if you used other types of long-term IVs, and you will likely be able to return to normal activities after your incision heals. An implanted port has two main parts: Reservoir. The reservoir is the part where a needle is inserted to give medicines or draw blood. The reservoir is round. After it is placed, it appears as a small, raised area under your skin. Catheter. The catheter is a thin, flexible tube that connects the reservoir to a vein. Medicine that is inserted into the reservoir goes into the catheter and then into the vein. How is my port accessed? To access your port: A numbing cream may be placed on the skin over the port site. Your health care provider will put on a mask and sterile gloves. The skin over your port will be cleaned carefully with a germ-killing soap and allowed to dry. Your health care provider will gently pinch the port and insert a needle into it. Your health care provider will check for a blood return to make sure the port is in the vein and is not clogged. If your port needs to remain accessed to get medicine continuously (constant infusion), your health care provider will place a clear bandage (dressing) over the needle site. The dressing and needle will need to be changed every week, or as told by your health care provider. What  is flushing? Flushing helps keep the port from getting clogged. Follow instructions from your health care provider about how and when to flush the port. Ports are usually flushed with saline solution or a medicine called heparin. The need for flushing will depend on how the port is used: If the port is only used from time to time to give medicines or draw blood, the port may need to be flushed: Before and after medicines have been given. Before and after blood has been drawn. As part of routine maintenance. Flushing may be recommended every 4-6 weeks. If a constant infusion is running, the port may not need to be flushed. Throw away any syringes in a disposal container that is meant for sharp items (sharps container). You can buy a sharps container from a pharmacy, or you can make one by using an empty hard plastic bottle with a cover. How long will my port stay implanted? The port can stay in for as long as your health care provider thinks it is needed. When it is time for the port to come out, a surgery will be done to remove it. The surgery will be similar to the procedure that was done to put the port in. Follow these instructions at home:  Flush your port as told by your health care provider. If you need an infusion over several days, follow instructions from your health care provider about how  to take care of your port site. Make sure you: Wash your hands with soap and water before you change your dressing. If soap and water are not available, use alcohol-based hand sanitizer. Change your dressing as told by your health care provider. Place any used dressings or infusion bags into a plastic bag. Throw that bag in the trash. Keep the dressing that covers the needle clean and dry. Do not get it wet. Do not use scissors or sharp objects near the tube. Keep the tube clamped, unless it is being used. Check your port site every day for signs of infection. Check for: Redness, swelling, or  pain. Fluid or blood. Pus or a bad smell. Protect the skin around the port site. Avoid wearing bra straps that rub or irritate the site. Protect the skin around your port from seat belts. Place a soft pad over your chest if needed. Bathe or shower as told by your health care provider. The site may get wet as long as you are not actively receiving an infusion. Return to your normal activities as told by your health care provider. Ask your health care provider what activities are safe for you. Carry a medical alert card or wear a medical alert bracelet at all times. This will let health care providers know that you have an implanted port in case of an emergency. Get help right away if: You have redness, swelling, or pain at the port site. You have fluid or blood coming from your port site. You have pus or a bad smell coming from the port site. You have a fever. Summary Implanted ports are usually placed in the chest for long-term IV access. Follow instructions from your health care provider about flushing the port and changing bandages (dressings). Take care of the area around your port by avoiding clothing that puts pressure on the area, and by watching for signs of infection. Protect the skin around your port from seat belts. Place a soft pad over your chest if needed. Get help right away if you have a fever or you have redness, swelling, pain, drainage, or a bad smell at the port site. This information is not intended to replace advice given to you by your health care provider. Make sure you discuss any questions you have with your health care provider. Document Revised: 08/06/2020 Document Reviewed: 10/01/2019 Elsevier Patient Education  [place].

## 2021-03-04 NOTE — Progress Notes (Signed)
Hematology and Oncology Follow Up Visit  Shannon Scott [id number] [birth year] 80 y.o. 03/04/2021   Principle Diagnosis:  Locally advanced/recurrent endometrial carcinoma undifferentiated --  TMB (HIGH) / MSI HIGH    Past Therapy: Radiation therapy for 5 -5 1/2 weeks - s/p 20 fractions Taxol/carboplatinum q 7 days -- s/p cycle 6    Current Therapy:        Pembrolizumab 400 mg q 12 weeks, s/p cycle 20 (originally started on 01/23/2019 at 400 mg IV q 6 wk)  --on hold for patient request since April 2022 Zometa 4 mg IV q 3 months -- next dose on 05/2021 IV iron as indicated --Venofer given on 12/16/2020   Interim History:  Shannon Scott is here today for follow-up.  She is doing quite nicely.  She has had no immunotherapy now for about 6 months.  She does not wish to have any treatment right now.  She said that she feels quite good.  We will go ahead and do another CT scan on her.  She will do 1 in November.  She has had no problems with pain.  There is no issues with abdominal pain.  She has a colostomy that is functioning.  Her back seems to be doing a whole lot better.  She was having a lot of problems with lower back discomfort.  This is arthritic in nature.  Again she is doing much better with this.  We do give her Zometa.  She gets this today.  She has had no cough.  There is been no problems with COVID.  She does have lymphedema in her legs.  The left leg is worse than the right leg.  This has been chronic.  She has had no bleeding.  Overall, her performance status is ECOG 1.      Medications:  Allergies as of 03/04/2021       Reactions   Statins Other (See Comments)   Myalgias and memory problems   Ciprofloxacin Itching   Splotchy redness with itching during IV infusion localized to arm.        Medication List        Accurate as of March 04, 2021 11:08 AM. If you have any questions, ask your nurse or doctor.          acetaminophen 500 MG  tablet Commonly known as: TYLENOL Take 500 mg by mouth as needed (pain).   apixaban 5 MG Tabs tablet Commonly known as: ELIQUIS Take 1 tablet (5 mg total) by mouth 2 (two) times daily.   clobetasol ointment 0.05 % Commonly known as: TEMOVATE Apply 1 application topically 3 (three) times a week.   conjugated estrogens 0.625 MG/GM vaginal cream Commonly known as: PREMARIN Place 1 Applicatorful vaginally 3 (three) times a week. Apply nightly for 2 weeks, then 3 times a week.   diltiazem 300 MG 24 hr capsule Commonly known as: TIAZAC TAKE 1 CAPSULE(300 MG) BY MOUTH DAILY What changed: See the new instructions.   levothyroxine 50 MCG tablet Commonly known as: SYNTHROID Take 1 tablet (50 mcg total) by mouth daily before breakfast.   lidocaine 5 % ointment Commonly known as: XYLOCAINE Apply 1 application topically as needed. Up to TID for treatment of burning symptoms of the vulva   lidocaine-prilocaine cream Commonly known as: EMLA Apply a dime size to port-a-cath 1-2 hours prior to access. Cover with Saran wrap.   mometasone 0.1 % cream Commonly known as: Elocon Apply 1 application topically daily. Use nightly  for 1-2 weeks, then use every other night   polyethylene glycol 17 g packet Commonly known as: MIRALAX / GLYCOLAX Take 17 g by mouth daily as needed for severe constipation.   Repatha SureClick 371 MG/ML Soaj Generic drug: Evolocumab Inject 1 Dose into the skin every 14 (fourteen) days.   traMADol 50 MG tablet Commonly known as: ULTRAM TAKE 2 TABLETS(100 MG) BY MOUTH TWICE DAILY        Allergies:  Allergies  Allergen Reactions   Statins Other (See Comments)    Myalgias and memory problems   Ciprofloxacin Itching    Splotchy redness with itching during IV infusion localized to arm.    Past Medical History, Surgical history, Social history, and Family History were reviewed and updated.  Review of Systems: Review of Systems  Constitutional: Negative.    HENT: Negative.    Eyes: Negative.   Respiratory: Negative.    Cardiovascular:  Positive for leg swelling.  Gastrointestinal: Negative.   Genitourinary: Negative.   Musculoskeletal:  Positive for back pain.  Skin: Negative.   Neurological: Negative.   Endo/Heme/Allergies: Negative.   Psychiatric/Behavioral: Negative.      Physical Exam:  weight is 146 lb (66.2 kg). Her oral temperature is 98 F (36.7 C). Her blood pressure is 159/50 (abnormal) and her pulse is 68. Her respiration is 16 and oxygen saturation is 100%.   Wt Readings from Last 3 Encounters:  03/04/21 146 lb (66.2 kg)  01/02/21 138 lb 12.8 oz (63 kg)  12/31/20 141 lb (64 kg)    Physical Exam Vitals reviewed.  HENT:     Head: Normocephalic and atraumatic.  Eyes:     Pupils: Pupils are equal, round, and reactive to light.  Cardiovascular:     Rate and Rhythm: Normal rate and regular rhythm.     Heart sounds: Normal heart sounds.     Comments: Cardiac exam shows a regular rate and rhythm.  She has a 1/6 systolic ejection murmur.   Pulmonary:     Effort: Pulmonary effort is normal.     Breath sounds: Normal breath sounds.  Abdominal:     General: Bowel sounds are normal.     Palpations: Abdomen is soft.     Comments: Abdominal exam shows a soft abdomen.  She has a colostomy that is intact.  She has no fluid wave.  There is no obvious abdominal mass.  There is no palpable liver or spleen tip.  Musculoskeletal:        General: No tenderness or deformity. Normal range of motion.     Cervical back: Normal range of motion.     Comments: She has significant lymphedema in her legs.  She has probably 3+ in the left leg and 2+ in the right leg.  I do not see any erythema.  She has pitting edema.  She has decent range of motion of her joints.  Lymphadenopathy:     Cervical: No cervical adenopathy.  Skin:    General: Skin is warm and dry.     Findings: No erythema or rash.  Neurological:     Mental Status: She is  alert and oriented to person, place, and time.  Psychiatric:        Behavior: Behavior normal.        Thought Content: Thought content normal.        Judgment: Judgment normal.     Lab Results  Component Value Date   WBC 5.7 03/04/2021   HGB 11.2 (L)  03/04/2021   HCT 34.2 (L) 03/04/2021   MCV 89.8 03/04/2021   PLT 279 03/04/2021   Lab Results  Component Value Date   FERRITIN 411 (H) 12/10/2020   IRON 34 (L) 12/10/2020   TIBC 193 (L) 12/10/2020   UIBC 159 12/10/2020   IRONPCTSAT 17 (L) 12/10/2020   Lab Results  Component Value Date   RETICCTPCT 1.2 03/04/2021   RBC 3.77 (L) 03/04/2021   RBC 3.81 (L) 03/04/2021   No results found for: KPAFRELGTCHN, LAMBDASER, KAPLAMBRATIO No results found for: IGGSERUM, IGA, IGMSERUM No results found for: Odetta Pink, SPEI   Chemistry      Component Value Date/Time   NA 139 03/04/2021 0958   NA 136 (A) 09/08/2017 0000   K 3.8 03/04/2021 0958   CL 104 03/04/2021 0958   CO2 28 03/04/2021 0958   BUN 15 03/04/2021 0958   BUN 8 09/08/2017 0000   CREATININE 0.81 03/04/2021 0958   CREATININE 0.69 08/02/2016 1519   GLU 111 09/08/2017 0000      Component Value Date/Time   CALCIUM 9.3 03/04/2021 0958   ALKPHOS 84 03/04/2021 0958   AST 12 (L) 03/04/2021 0958   ALT 7 03/04/2021 0958   BILITOT 0.3 03/04/2021 0958       Impression and Plan: Shannon Scott is a very pleasant 80 yo caucasian female with recurrent undifferentiated endometrial carcinoma.   She was on immunotherapy.  She has a high MSI.  Again, she has decided to hold on therapy for right now.  She knows that at some point, we likely will need to get started back in the future.  We will do a CT scan on her in November.  If there is anything on the CT scan that looks like recurrence, then we can certainly get the pembrolizumab restarted.  We will try to get her through all the holidays now.  We will plan to get her back in  January.   Volanda Napoleon, MD 10/5/202211:08 AM

## 2021-03-04 NOTE — Patient Instructions (Signed)
Zoledronic Acid Injection (Hypercalcemia, Oncology) What is this medication? ZOLEDRONIC ACID (ZOE le dron ik AS id) slows calcium loss from bones. It high calcium levels in the blood from some kinds of cancer. It may be used in other people at risk for bone loss. This medicine may be used for other purposes; ask your health care provider or pharmacist if you have questions. COMMON BRAND NAME(S): Zometa What should I tell my care team before I take this medication? They need to know if you have any of these conditions: cancer dehydration dental disease kidney disease liver disease low levels of calcium in the blood lung or breathing disease (asthma) receiving steroids like dexamethasone or prednisone an unusual or allergic reaction to zoledronic acid, other medicines, foods, dyes, or preservatives pregnant or trying to get pregnant breast-feeding How should I use this medication? This drug is injected into a vein. It is given by a health care provider in a hospital or clinic setting. Talk to your health care provider about the use of this drug in children. Special care may be needed. Overdosage: If you think you have taken too much of this medicine contact a poison control center or emergency room at once. NOTE: This medicine is only for you. Do not share this medicine with others. What if I miss a dose? Keep appointments for follow-up doses. It is important not to miss your dose. Call your health care provider if you are unable to keep an appointment. What may interact with this medication? certain antibiotics given by injection NSAIDs, medicines for pain and inflammation, like ibuprofen or naproxen some diuretics like bumetanide, furosemide teriparatide thalidomide This list may not describe all possible interactions. Give your health care provider a list of all the medicines, herbs, non-prescription drugs, or dietary supplements you use. Also tell them if you smoke, drink alcohol, or  use illegal drugs. Some items may interact with your medicine. What should I watch for while using this medication? Visit your health care provider for regular checks on your progress. It may be some time before you see the benefit from this drug. Some people who take this drug have severe bone, joint, or muscle pain. This drug may also increase your risk for jaw problems or a broken thigh bone. Tell your health care provider right away if you have severe pain in your jaw, bones, joints, or muscles. Tell you health care provider if you have any pain that does not go away or that gets worse. Tell your dentist and dental surgeon that you are taking this drug. You should not have major dental surgery while on this drug. See your dentist to have a dental exam and fix any dental problems before starting this drug. Take good care of your teeth while on this drug. Make sure you see your dentist for regular follow-up appointments. You should make sure you get enough calcium and vitamin D while you are taking this drug. Discuss the foods you eat and the vitamins you take with your health care provider. Check with your health care provider if you have severe diarrhea, nausea, and vomiting, or if you sweat a lot. The loss of too much body fluid may make it dangerous for you to take this drug. You may need blood work done while you are taking this drug. Do not become pregnant while taking this drug. Women should inform their health care provider if they wish to become pregnant or think they might be pregnant. There is potential for serious  harm to an unborn child. Talk to your health care provider for more information. What side effects may I notice from receiving this medication? Side effects that you should report to your doctor or health care provider as soon as possible: allergic reactions (skin rash, itching or hives; swelling of the face, lips, or tongue) bone pain infection (fever, chills, cough, sore  throat, pain or trouble passing urine) jaw pain, especially after dental work joint pain kidney injury (trouble passing urine or change in the amount of urine) low blood pressure (dizziness; feeling faint or lightheaded, falls; unusually weak or tired) low calcium levels (fast heartbeat; muscle cramps or pain; pain, tingling, or numbness in the hands or feet; seizures) low magnesium levels (fast, irregular heartbeat; muscle cramp or pain; muscle weakness; tremors; seizures) low red blood cell counts (trouble breathing; feeling faint; lightheaded, falls; unusually weak or tired) muscle pain redness, blistering, peeling, or loosening of the skin, including inside the mouth severe diarrhea swelling of the ankles, feet, hands trouble breathing Side effects that usually do not require medical attention (report to your doctor or health care provider if they continue or are bothersome): anxious constipation coughing depressed mood eye irritation, itching, or pain fever general ill feeling or flu-like symptoms nausea pain, redness, or irritation at site where injected trouble sleeping This list may not describe all possible side effects. Call your doctor for medical advice about side effects. You may report side effects to FDA at 1-800-FDA-1088. Where should I keep my medication? This drug is given in a hospital or clinic. It will not be stored at home. NOTE: This sheet is a summary. It may not cover all possible information. If you have questions about this medicine, talk to your doctor, pharmacist, or health care provider.  2022 Elsevier/Gold Standard (2019-03-01 09:13:00)

## 2021-03-04 NOTE — Progress Notes (Signed)
Per MD- no Treatment today- only Zometa for pt.

## 2021-03-05 ENCOUNTER — Telehealth: Payer: Self-pay | Admitting: *Deleted

## 2021-03-05 NOTE — Telephone Encounter (Signed)
Per staff message Erline Levine - called patient to schedule (1) dose of IV Iron - patient to call back to see when she could have transportation to bring her.

## 2021-03-05 NOTE — Telephone Encounter (Signed)
Per staff message called and gave upcoming appointment - confirmed (1) dose of IV Iron Venofer

## 2021-03-06 ENCOUNTER — Other Ambulatory Visit: Payer: Self-pay

## 2021-03-06 ENCOUNTER — Inpatient Hospital Stay: Payer: Medicare Other

## 2021-03-06 VITALS — BP 151/43 | HR 66 | Temp 98.0°F | Resp 17

## 2021-03-06 DIAGNOSIS — Z7901 Long term (current) use of anticoagulants: Secondary | ICD-10-CM | POA: Diagnosis not present

## 2021-03-06 DIAGNOSIS — Z933 Colostomy status: Secondary | ICD-10-CM | POA: Diagnosis not present

## 2021-03-06 DIAGNOSIS — R609 Edema, unspecified: Secondary | ICD-10-CM | POA: Diagnosis not present

## 2021-03-06 DIAGNOSIS — D508 Other iron deficiency anemias: Secondary | ICD-10-CM

## 2021-03-06 DIAGNOSIS — Z8542 Personal history of malignant neoplasm of other parts of uterus: Secondary | ICD-10-CM | POA: Diagnosis not present

## 2021-03-06 DIAGNOSIS — D509 Iron deficiency anemia, unspecified: Secondary | ICD-10-CM | POA: Diagnosis not present

## 2021-03-06 DIAGNOSIS — Z79899 Other long term (current) drug therapy: Secondary | ICD-10-CM | POA: Diagnosis not present

## 2021-03-06 MED ORDER — SODIUM CHLORIDE 0.9 % IV SOLN
200.0000 mg | Freq: Once | INTRAVENOUS | Status: AC
  Administered 2021-03-06: 200 mg via INTRAVENOUS
  Filled 2021-03-06: qty 200

## 2021-03-06 MED ORDER — HEPARIN SOD (PORK) LOCK FLUSH 100 UNIT/ML IV SOLN
500.0000 [IU] | Freq: Once | INTRAVENOUS | Status: AC | PRN
  Administered 2021-03-06: 500 [IU]

## 2021-03-06 MED ORDER — SODIUM CHLORIDE 0.9 % IV SOLN: INTRAVENOUS | Status: DC

## 2021-03-06 MED ORDER — SODIUM CHLORIDE 0.9% FLUSH
10.0000 mL | Freq: Once | INTRAVENOUS | Status: AC | PRN
  Administered 2021-03-06: 10 mL

## 2021-03-06 NOTE — Progress Notes (Signed)
Pt declined to stay for post infusion observation period. Pt stated she has tolerated medication multiple times prior without difficulty. Pt aware to call clinic with any questions or concerns. Pt verbalized understanding and had no further questions.  ? ?

## 2021-03-06 NOTE — Patient Instructions (Signed)

## 2021-03-19 ENCOUNTER — Telehealth (INDEPENDENT_AMBULATORY_CARE_PROVIDER_SITE_OTHER): Payer: Medicare Other | Admitting: Internal Medicine

## 2021-03-19 ENCOUNTER — Encounter: Payer: Self-pay | Admitting: Internal Medicine

## 2021-03-19 VITALS — BP 141/62 | HR 69 | Wt 145.0 lb

## 2021-03-19 DIAGNOSIS — Z951 Presence of aortocoronary bypass graft: Secondary | ICD-10-CM

## 2021-03-19 DIAGNOSIS — M791 Myalgia, unspecified site: Secondary | ICD-10-CM | POA: Diagnosis not present

## 2021-03-19 DIAGNOSIS — E7801 Familial hypercholesterolemia: Secondary | ICD-10-CM

## 2021-03-19 DIAGNOSIS — T466X5A Adverse effect of antihyperlipidemic and antiarteriosclerotic drugs, initial encounter: Secondary | ICD-10-CM

## 2021-03-19 DIAGNOSIS — Z8673 Personal history of transient ischemic attack (TIA), and cerebral infarction without residual deficits: Secondary | ICD-10-CM

## 2021-03-19 DIAGNOSIS — I480 Paroxysmal atrial fibrillation: Secondary | ICD-10-CM | POA: Diagnosis not present

## 2021-03-19 NOTE — Progress Notes (Signed)
Virtual Visit via Video Note   This visit type was conducted due to national recommendations for restrictions regarding the COVID-19 Pandemic (e.g. social distancing) in an effort to limit this patient's exposure and mitigate transmission in our community.  Due to her co-morbid illnesses, this patient is at least at moderate risk for complications without adequate follow up.  This format is felt to be most appropriate for this patient at this time.  All issues noted in this document were discussed and addressed.  A limited physical exam was performed with this format.  Please refer to the patient's chart for her consent to telehealth for [facility].      Date:  03/19/2021   ID:  Shannon Scott, DOB 12-29-1940, MRN [id number] The patient was identified using 2 identifiers.  Evaluation Performed:  Follow-Up Visit  Patient Location:  [place] [id number]  Provider location:   [address] [place], [place] [address]  PCP:  Leeroy Cha, MD  Cardiologist:  Pixie Casino, MD Electrophysiologist:  None   Chief Complaint:  Follow-up FH  History of Present Illness:    Shannon Scott is a 80 y.o. female who presents via audio/video conferencing for a telehealth visit today.  Shannon Scott is a 80 year old female with severe combined dyslipidemia with CABG in 2005 who has been intolerant to statins, Welchol, niacin, Zetia, and other cholesterol-lower medications. In the past I had sent her to Dr. Moshe Cipro at [facility], who is a lipidologist, for evaluation for apheresis. However, due to the cost of close to $1000 a month she felt that was intolerable. Recently she was complaining of neck pain, underwent carotid Dopplers that you ordered, and those demonstrated 40% to 59% right internal carotid stenosis and a 60% to 79% left internal carotid stenosis. She does have a left carotid bruit. In addition, she is describing a band-like pressure  around her chest which she attributed to her blood pressure medicine and then had actually discontinued that; however, the symptoms still persist. This is a little bit worse with exertion, does relieve with rest or taking blood pressure medicine, and is concerning for angina. She's also complaining of some left-sided numbness facial tingling which could be TIA. She has not been taking aspirin and she reports developing what sounds like petechiae on that, but is also not willing to take Plavix or other antiplatelet medications. She does also report pain center called for her body and her shoulders and her knees and her legs as well.  Shannon Scott returns today for follow-up. It's been about a year and a half since I seen her. She does have a history of multivessel coronary disease and prior bypass in 2005. Her last stress test was in 2013 which was negative for ischemia. An echo in 2008 showed probable mild aortic stenosis. She does have a murmur of aortic stenosis today which may be a little louder. Blood pressure is also poorly controlled today however she said she did not take her medicine. She says that she is taking only a half of her herbs certain pill as it causes her more swelling if she takes the whole pill, despite the fact that contains a diuretic. She's been very intolerant to almost all medications including all of the statins and Zetia. No medicines of been tolerated and she continues to have a very high cholesterol over 300. I do think she be a good candidate for a PC SK 9 inhibitor.  Mrs.  Scott returns today for follow-up. She is recently started on BiDil and report some mild improvement in blood pressure although she feels when her blood pressure gets low that she is symptomatic. She may have some degree of lack of cerebral autoregulation due to long-standing hypertension. Unfortunate she's been intolerant to all medications. She's been tried on all of the statin medications, WelChol,  niacin and steady and has been intolerant. I referred her to [facility] in 2013 for apheresis, however due to the very high cost this was never pursued. With the recent approval of PC SK 9 inhibitors, I think this would be a very good option for her. She has known ASCVD and FH based on her lipid profile. Her most recent cholesterol demonstrated total cholesterol 401, triglycerides 91, HDL 50, and LDL 333. Based on these findings her Dutch score is 8.  08/02/2016  Shannon Scott returns today for follow-up. Although she is asymptomatic, she still has significant untreated risk factors. Most notably she likely has FH. Her brother died recently suddenly, which is suspicious for cardiovascular disease. She does have 2 children, however who apparently do not have any significant cholesterol abnormalities. She does also have a parent to had premature coronary artery disease. Her most recent LDL cholesterol was over 300 but has not been checked in a number of years. She's been intolerant to all statins, Zetia and WelChol. She previously been referred for lipoprotein apheresis however did not make that appointment due to the cost.  11/18/2016  Shannon Scott was seen today again in follow-up. She underwent recent lipid testing which again is consistent with either FH or possibly an autosomal recessive LDL receptor defect or gain of function mutation and PCSK9. LDL particle numbers remain elevated at 2191, despite this she has an A pattern. Total cholesterol 333 with LDL-C 257 and HDL-C 56. Non-HDL cholesterol of 277. She is speaking with our pharmacist about starting Redpath but reports that she is not interested in medication. She also says she does not think she can afford the medication and can barely pay for house meds. Blood pressure remains not at goal today. She reports pain short of breath and having lower extremity swelling. Recently doxazosin was added at night - but she's not noted any significant difference in her  blood pressure.  06/03/2017  Shannon Scott returns today for follow-up of hypertension and dyslipidemia.  As previously mentioned she has FH with significantly elevated LDL greater than 250 -given her premature CAD, family history of early CAD, very high LDL, apo B and LpA numbers, she may have HoFH.  She was approved for treatment with Repatha, however is afraid of needles and is not interested in that therapy.  There is potentially one other option for her and that would be Juxtapid. Clinical profile certainly fits HoFH.  This is the only oral therapy on the market for familial hyperlipidemia.  I believe this certainly could be an option for her, however it has its own side effect profile.  We will certainly need to monitor her liver function closely as specified through the [facility] proram.  Blood pressure is again noted to be elevated today.  She says this may be related to problems with a left hip bursitis.   09/18/2019  Karlin returns today for follow-up.  Overall she is without any significant complaints.  She has continued to struggle with cancer and is still undergoing some chemotherapy.  She ultimately had to have colostomy and is not clear whether that would be reversed.  She has been uninterested in any lipid lowering medications particular PCSK9 inhibitors and is been statin intolerant.  We discussed some other options however she was hesitant to consider them.  Today we also discussed the possibility of enrolling her in a clinical trial.  She may very well qualify for [facility] for, which is a study of inclisiran, a short interfering RNA to PCSK9.   09/04/2020  Adine is seen today in follow-up.  In the interim she had been seen by Almyra Deforest, PA-C.  They discussed starting Repatha and she was agreeable to it.  Fortunately she has tolerated it well.  She has not had her lipids rechecked but she has had at least 6 doses of the medication.  She does have upcoming labs drawn from her port through the cancer center.   She continues to undergo chemotherapy for metastatic uterine cancer.  Cholesterol has been very high with LDL in the 300s.  She also has a history of coronary disease and prior bypass.  03/19/2021  Joby returns today for follow-up.  Unfortunately over the summer she was again in the hospital for what was felt to be strokelike symptoms.  It was possibly an amnestic response as no new findings were noted on neuroimaging.  She has been compliant with Repatha and tolerates it well although cannot take other lipid-lowering medications.  Given her high untreated LDL cholesterol up to 385, it is very possible she could have clinical homozygous familial hyperlipidemia ([place]).  Her response to Repatha has been very good with recent lipids showing total cholesterol in April of 278, triglycerides 104, HDL 62 and LDL 198.  She denies any chest pain or worsening shortness of breath.   The patient does not have symptoms concerning for COVID-19 infection (fever, chills, cough, or new SHORTNESS OF BREATH).    Prior CV studies:   The following studies were reviewed today:  Chart reviewed  PMHx:  Past Medical History:  Diagnosis Date   A-fib ([place]) 03/26/2020   Acute CVA (cerebrovascular accident) ([place]) 03/26/2020   Arthritis    Asthma    allergy induced   Bilateral carotid artery disease (H[facility])    L carotid bruit   Bursitis    left hip   CAD (coronary artery disease)    Cancer ([place])    Dyslipidemia    intolerant to statins, welchol, niacin, zetia   Goals of care, counseling/discussion 10/11/2017   History of blood transfusion    History of nuclear stress test 04/24/2012   lexiscan; normal study   Hypertension    Hypothyroidism    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 ([place]) 10/11/2017   Postoperative nausea and vomiting 01/02/2016    Past Surgical History:  Procedure Laterality Date   ABDOMINAL HYSTERECTOMY     Carotid Doppler  02/2012   40-59% right  int carotid artery stenosis; 60-79% L int carotid stenosis; L carotid bruit   CORONARY ARTERY BYPASS GRAFT  03/12/2004   LIMA to LAD, SVG to circumflex, SVG to PDA   CYSTOSCOPY W/ URETERAL STENT PLACEMENT Left 12/06/2017   Procedure: CYSTOSCOPY WITH LEFT URETERAL STENT EXCHANGE;  Surgeon: Irine Seal, MD;  Location: [facility];  Service: Urology;  Laterality: Left;   CYSTOSCOPY WITH STENT PLACEMENT Bilateral 08/21/2017   Procedure: CYSTOSCOPY WITH STENT PLACEMENT;  Surgeon: Ceasar Mons, MD;  Location: [facility];  Service: Urology;  Laterality: Bilateral;   IR FLUORO GUIDE PORT INSERTION RIGHT  10/11/2017   IR GENERIC HISTORICAL  04/29/2016   IR RADIOLOGIST EVAL & MGMT 04/29/2016 Sandi Mariscal, MD [facility] INTERV RAD   IR GENERIC HISTORICAL  05/12/2016   IR RADIOLOGIST EVAL & MGMT 05/12/2016 Sandi Mariscal, MD [facility] INTERV RAD   IR IVC FILTER PLMT / S&I Burke Keels GUID/MOD SED  10/11/2017   IR US GUIDE VASC ACCESS RIGHT  10/11/2017   ROBOTIC ASSISTED TOTAL HYSTERECTOMY WITH BILATERAL SALPINGO OOPHERECTOMY Bilateral 12/30/2015   Procedure: XI ROBOTIC ASSISTED TOTAL HYSTERECTOMY WITH BILATERAL SALPINGO OOPHORECTOMY WITH SENTAL LYMPH NODE BIOPSY;  Surgeon: Nancy Marus, MD;  Location: [facility];  Service: Gynecology;  Laterality: Bilateral;   TONSILLECTOMY     TRANSTHORACIC ECHOCARDIOGRAM  04/2007   EF>55%; mild MR; mild-mod TR; mild pulm HTN; mild calcification of aortiv valve leaflets with mild valvular aortic stenosis   TUBAL LIGATION      FAMHx:  Family History  Problem Relation Age of Onset   Hypertension Mother    Heart disease Mother        Died in her 43s   Stroke Father    Kidney disease Brother    Heart disease Brother        also HTN, hyperlipidemia   Heart attack Brother    Stroke Sister        x2   Hypertension Sister     SOCHx:   reports that she has never smoked. She has never used smokeless tobacco. She reports that she does not drink alcohol and does not use drugs.  ALLERGIES:   Allergies  Allergen Reactions   Statins Other (See Comments)    Myalgias and memory problems   Ciprofloxacin Itching    Splotchy redness with itching during IV infusion localized to arm.    MEDS:  Current Meds  Medication Sig   acetaminophen (TYLENOL) 500 MG tablet Take 500 mg by mouth as needed (pain).   apixaban (ELIQUIS) 5 MG TABS tablet Take 1 tablet (5 mg total) by mouth 2 (two) times daily.   clobetasol ointment (TEMOVATE) 1.01 % Apply 1 application topically 3 (three) times a week.   conjugated estrogens (PREMARIN) vaginal cream Place 1 Applicatorful vaginally 3 (three) times a week. Apply nightly for 2 weeks, then 3 times a week.   diltiazem (TIAZAC) 300 MG 24 hr capsule TAKE 1 CAPSULE(300 MG) BY MOUTH DAILY (Patient taking differently: Take 300 mg by mouth at bedtime.)   Evolocumab (REPATHA SURECLICK) 751 MG/ML SOAJ Inject 1 Dose into the skin every 14 (fourteen) days.   levothyroxine (SYNTHROID, LEVOTHROID) 50 MCG tablet Take 1 tablet (50 mcg total) by mouth daily before breakfast.   lidocaine (XYLOCAINE) 5 % ointment Apply 1 application topically as needed. Up to TID for treatment of burning symptoms of the vulva   lidocaine-prilocaine (EMLA) cream Apply a dime size to port-a-cath 1-2 hours prior to access. Cover with Saran wrap.   mometasone (ELOCON) 0.1 % cream Apply 1 application topically daily. Use nightly for 1-2 weeks, then use every other night   polyethylene glycol (MIRALAX / GLYCOLAX) 17 g packet Take 17 g by mouth daily as needed for severe constipation.   traMADol (ULTRAM) 50 MG tablet TAKE 2 TABLETS(100 MG) BY MOUTH TWICE DAILY     ROS: Pertinent items noted in HPI and remainder of comprehensive ROS otherwise negative.  Labs/Other Tests and Data Reviewed:    Recent Labs: 03/29/2020: Magnesium 1.9 12/10/2020: TSH 2.325 03/04/2021: ALT 7; BUN 15; Creatinine 0.81; Hemoglobin 11.2; Platelet Count 279; Potassium 3.8; Sodium 139   Recent Lipid Panel  Lab Results   Component Value Date/Time   CHOL 278 (H) 09/08/2020 12:00 AM   CHOL 333 (H) 08/02/2016 03:19 PM   TRIG 104 09/08/2020 12:00 AM   TRIG 87 08/02/2016 03:19 PM   HDL 62 09/08/2020 12:00 AM   HDL 56 08/02/2016 03:19 PM   CHOLHDL 4.5 (H) 09/08/2020 12:00 AM   CHOLHDL 7.4 03/28/2020 08:39 AM   LDLCALC 198 (H) 09/08/2020 12:00 AM   LDLCALC 257 (H) 08/02/2016 03:19 PM    Wt Readings from Last 3 Encounters:  03/19/21 145 lb (65.8 kg)  03/04/21 146 lb (66.2 kg)  01/02/21 138 lb 12.8 oz (63 kg)     Exam:    Vital Signs:  BP (!) 141/62   Pulse 69   Wt 145 lb (65.8 kg)   BMI 27.40 kg/m    General appearance: alert and no distress Lungs: No audible wheezing Abdomen: Mildly overweight Extremities: extremities normal, atraumatic, no cyanosis or edema Skin: Skin color, texture, turgor normal. No rashes or lesions Neurologic: Grossly normal Psych: Pleasant  ASSESSMENT & PLAN:    Familial hypercholesterolemia (possibly HoFH) with a [place] score of 8, also consider LDL adapter protein deficiency or GOF mutation in PCSK9 History of TIAs Multidrug intolerance - including statins, welchol, zetia, etc, but also less than expected response to therapy  CABG x 3 (2005) Known moderate to severe bilateral carotid artery disease  Uncontrolled hypertension Aortic stenosis (LPa - 123), ApoB of 175 Advanced and recurrent endometrial cancer Bowel obstruction status post ostomy  Ms. Carico has had improvement in her LDL cholesterol now down to 195 however this is on Repatha and she cannot tolerate statins, WelChol, Zetia, BAS agents and other therapies.  Options are quite limited at this point other than possibly bempedoic acid or based on her very high LDL cholesterol, she clinically could be considered homozygous FH.  This could open up the possibility of Evkeeza.  I did discuss this therapy with her today.  As she has support is possible she might be able to receive home infusions through that  port once a month of the medication.  This is expected to be associated with approximately 50% reduction in cholesterol on top of her PCSK9 inhibitor.  We will plan repeat lipids and she will consider this and do more research on the product.  Plan follow-up with me in 6 months or sooner as necessary.  COVID-19 Education: The signs and symptoms of COVID-19 were discussed with the patient and how to seek care for testing (follow up with PCP or arrange E-visit).  The importance of social distancing was discussed today.  Patient Risk:   After full review of this patients clinical status, I feel that they are at least moderate risk at this time.  Time:   Today, I have spent 35 minutes with the patient with telehealth technology discussing lipid-lowering, FH, new therapeutic options.     Medication Adjustments/Labs and Tests Ordered: Current medicines are reviewed at length with the patient today.  Concerns regarding medicines are outlined above.   Tests Ordered: Orders Placed This Encounter  Procedures   Lipid panel   Lipid panel    Medication Changes: No orders of the defined types were placed in this encounter.   Disposition:  in 6 month(s)  Pixie Casino, MD, [facility], [place] Director of the Advanced Lipid Disorders &  Cardiovascular Risk Reduction Clinic Diplomate of the American Board of Clinical Lipidology Attending Cardiologist  Direct Dial: [phone]  Fax: [phone]  Website:  [email or web address]  Pixie Casino, MD  03/19/2021 8:19 PM

## 2021-03-19 NOTE — Patient Instructions (Signed)
Medication Instructions:  Continue current medications for now -- changes will be dependent on your lab results  *If you need a refill on your cardiac medications before your next appointment, please call your pharmacy*   Lab Work: FASTING lipid panel to assess cholesterol as soon as you are able This can be done at Dr. Lysbeth Penner office or another [facility] location   FASTING lipid panel to assess cholesterol in about 6 months  -- complete a few days before your next appointment  If you have labs (blood work) drawn today and your tests are completely normal, you will receive your results only by: [place] (if you have MyChart) OR A paper copy in the mail If you have any lab test that is abnormal or we need to change your treatment, we will call you to review the results.   Testing/Procedures: NONE   Follow-Up: At [facility], you and your health needs are our priority.  As part of our continuing mission to provide you with exceptional heart care, we have created designated Provider Care Teams.  These Care Teams include your primary Cardiologist (physician) and Advanced Practice Providers (APPs -  Physician Assistants and Nurse Practitioners) who all work together to provide you with the care you need, when you need it.  We recommend signing up for the patient portal called "MyChart".  Sign up information is provided on this After Visit Summary.  MyChart is used to connect with patients for Virtual Visits (Telemedicine).  Patients are able to view lab/test results, encounter notes, upcoming appointments, etc.  Non-urgent messages can be sent to your provider as well.   To learn more about what you can do with MyChart, go to [email or web address].    Your next appointment:   6 month(s)  The format for your next appointment:   In Person  Provider:   K. Mali Hilty, MD   Other Instructions

## 2021-03-31 ENCOUNTER — Telehealth: Payer: Self-pay | Admitting: *Deleted

## 2021-03-31 NOTE — Telephone Encounter (Signed)
Patient called and rescheduled her appt from 11/4 to 11/14

## 2021-04-03 ENCOUNTER — Other Ambulatory Visit (HOSPITAL_BASED_OUTPATIENT_CLINIC_OR_DEPARTMENT_OTHER): Payer: Self-pay

## 2021-04-03 ENCOUNTER — Inpatient Hospital Stay: Payer: Medicare Other | Admitting: Gynecologic Oncology

## 2021-04-03 ENCOUNTER — Encounter: Payer: Self-pay | Admitting: Hematology & Oncology

## 2021-04-03 ENCOUNTER — Encounter (HOSPITAL_BASED_OUTPATIENT_CLINIC_OR_DEPARTMENT_OTHER): Payer: Self-pay

## 2021-04-03 ENCOUNTER — Encounter: Payer: Self-pay | Admitting: Family

## 2021-04-03 ENCOUNTER — Other Ambulatory Visit: Payer: Self-pay

## 2021-04-03 ENCOUNTER — Ambulatory Visit (HOSPITAL_BASED_OUTPATIENT_CLINIC_OR_DEPARTMENT_OTHER)
Admission: RE | Admit: 2021-04-03 | Discharge: 2021-04-03 | Disposition: A | Discharge status: Home / Self Care | Payer: Medicare Other | Source: Ambulatory Visit | Attending: Hematology & Oncology | Admitting: Hematology & Oncology

## 2021-04-03 DIAGNOSIS — K439 Ventral hernia without obstruction or gangrene: Secondary | ICD-10-CM | POA: Diagnosis not present

## 2021-04-03 DIAGNOSIS — S2231XA Fracture of one rib, right side, initial encounter for closed fracture: Secondary | ICD-10-CM | POA: Diagnosis not present

## 2021-04-03 DIAGNOSIS — C539 Malignant neoplasm of cervix uteri, unspecified: Secondary | ICD-10-CM | POA: Insufficient documentation

## 2021-04-03 DIAGNOSIS — C799 Secondary malignant neoplasm of unspecified site: Secondary | ICD-10-CM | POA: Diagnosis not present

## 2021-04-03 DIAGNOSIS — S3210XA Unspecified fracture of sacrum, initial encounter for closed fracture: Secondary | ICD-10-CM | POA: Diagnosis not present

## 2021-04-03 DIAGNOSIS — K8020 Calculus of gallbladder without cholecystitis without obstruction: Secondary | ICD-10-CM | POA: Diagnosis not present

## 2021-04-03 DIAGNOSIS — K449 Diaphragmatic hernia without obstruction or gangrene: Secondary | ICD-10-CM | POA: Diagnosis not present

## 2021-04-03 DIAGNOSIS — I2510 Atherosclerotic heart disease of native coronary artery without angina pectoris: Secondary | ICD-10-CM | POA: Diagnosis not present

## 2021-04-03 LAB — LIPID PANEL
Chol/HDL Ratio: 4.9 ratio — ABNORMAL HIGH (ref 0.0–4.4)
Cholesterol, Total: 276 mg/dL — ABNORMAL HIGH (ref 100–199)
HDL: 56 mg/dL (ref >39)
LDL Chol Calc (NIH): 205 mg/dL — ABNORMAL HIGH (ref 0–99)
Triglycerides: 88 mg/dL (ref 0–149)
VLDL Cholesterol Cal: 15 mg/dL (ref 5–40)

## 2021-04-03 MED ORDER — IOHEXOL 300 MG/ML  SOLN
100.0000 mL | Freq: Once | INTRAMUSCULAR | Status: AC | PRN
  Administered 2021-04-03: 100 mL via INTRAVENOUS

## 2021-04-03 MED ORDER — INFLUENZA VAC A&B SA ADJ QUAD 0.5 ML IM PRSY
PREFILLED_SYRINGE | INTRAMUSCULAR | 0 refills | Status: DC
  Filled 2021-04-03: qty 0.5, 1d supply, fill #0

## 2021-04-06 ENCOUNTER — Encounter: Payer: Self-pay | Admitting: *Deleted

## 2021-04-08 ENCOUNTER — Encounter: Payer: Self-pay | Admitting: Gynecologic Oncology

## 2021-04-10 DIAGNOSIS — I10 Essential (primary) hypertension: Secondary | ICD-10-CM | POA: Diagnosis not present

## 2021-04-10 DIAGNOSIS — E039 Hypothyroidism, unspecified: Secondary | ICD-10-CM | POA: Diagnosis not present

## 2021-04-10 DIAGNOSIS — I890 Lymphedema, not elsewhere classified: Secondary | ICD-10-CM | POA: Diagnosis not present

## 2021-04-10 DIAGNOSIS — I2510 Atherosclerotic heart disease of native coronary artery without angina pectoris: Secondary | ICD-10-CM | POA: Diagnosis not present

## 2021-04-10 DIAGNOSIS — H9193 Unspecified hearing loss, bilateral: Secondary | ICD-10-CM | POA: Diagnosis not present

## 2021-04-13 ENCOUNTER — Other Ambulatory Visit: Payer: Self-pay

## 2021-04-13 ENCOUNTER — Encounter: Payer: Self-pay | Admitting: Gynecologic Oncology

## 2021-04-13 ENCOUNTER — Inpatient Hospital Stay: Payer: Medicare Other | Attending: Hematology & Oncology | Admitting: Gynecologic Oncology

## 2021-04-13 VITALS — BP 173/53 | HR 69 | Temp 98.2°F | Resp 16 | Ht 61.0 in | Wt 145.0 lb

## 2021-04-13 DIAGNOSIS — Z8542 Personal history of malignant neoplasm of other parts of uterus: Secondary | ICD-10-CM

## 2021-04-13 DIAGNOSIS — Z923 Personal history of irradiation: Secondary | ICD-10-CM | POA: Diagnosis not present

## 2021-04-13 DIAGNOSIS — C541 Malignant neoplasm of endometrium: Secondary | ICD-10-CM

## 2021-04-13 DIAGNOSIS — Z90710 Acquired absence of both cervix and uterus: Secondary | ICD-10-CM | POA: Diagnosis not present

## 2021-04-13 DIAGNOSIS — Z90722 Acquired absence of ovaries, bilateral: Secondary | ICD-10-CM | POA: Diagnosis not present

## 2021-04-13 DIAGNOSIS — Z9221 Personal history of antineoplastic chemotherapy: Secondary | ICD-10-CM | POA: Insufficient documentation

## 2021-04-13 NOTE — Patient Instructions (Signed)
It was good to see you today.  I do not see any concerning areas on your exam.  Please keep using your low-dose steroid cream if you see new spots or have any symptoms.  After you speak with your primary care physician's office, please call and let me know if you would like me to place the referral to the lymphedema clinic.  We discussed possible referral to one of the general surgeons in town regarding your hernia.  If you ultimately decide that you would like this referral placed, please call my clinic.

## 2021-04-13 NOTE — Progress Notes (Signed)
Gynecologic Oncology Return Clinic Visit  04/13/21  Reason for Visit: surveillance visit in the setting of endometrial cancer  Treatment History: Oncology History Overview Note  The patient began noticing postmenopausal bleeding in June, 2017. She saw her primary care physician who determined there was no blood in the urine, and she was then referred to Dr Benjie Karvonen who, on 11/21/15 performed a TVUS which showed a uterus measuring 6x2.7x3.3cm with normal ovaries. There was a thickened endometrium of 6.30m.    [place]ometrial cancer ([facility]  11/21/2015 Initial Diagnosis   [place]ometrial cancer ([place] A pipelle endometrial biopsy was performed on 11/21/15 which showed FIGO grade 1-2 endometrial cancer.  The pap from the same date was normal.   12/30/2015 Surgery   Robotic assisted total hysterectomy, BSO, pelvic and PA SLN biopsy. 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  Postoperative Diagnosis: same.  Surgery: Total robotic hysterectomy bilateral salpingo-oophorectomy, left pelvic, right para-aortic and bifurcation SLN removal  Operative findings:  1) Colon adherent to posterior uterus, left sidewall and appendix 2) 2 right para-aortic nodes for SLN (high and PA node) 3) Aortic bifurcation nodes 4) Left obturator node  Diagnosis 1. Lymph node, sentinel, biopsy, right obturator - ONE BENIGN LYMPH NODE (0/1). 2. Lymph node, sentinel, biopsy, right peri-aortic - ONE BENIGN LYMPH NODE (0/1). 3. Lymph node, sentinel, biopsy, right lower peri-aortic - ONE BENIGN LYMPH NODE (0/1). 4. Lymph node, sentinel, biopsy, aortic bifurcation - ONE BENIGN LYMPH NODE (0/1). 5. Lymph node, sentinel, biopsy, left obturator - ONE BENIGN LYMPH NODE (0/1). 6. Uterus +/- tubes/ovaries, neoplastic - ENDOMETRIAL ADENOCARCINOMA, 1.7 CM WITH SUPERFICIAL MYOMETRIAL INVASION. - MARGINS NOT INVOLVED. - CERVIX, BILATERAL OVARIES AND BILATERAL FALLOPIAN TUBES FREE OF TUMOR. Microscopic  Comment 6. ONCOLOGY TABLE-UTERUS, CARCINOMA OR CARCINOSARCOMA Specimen: Uterus with bilateral fallopian tubes and ovaries and sentinel lymph node biopsies. Procedure: Hysterectomy with sentinel lymph nodes. Lymph node sampling performed: Yes. Specimen integrity: Intact. Maximum tumor size: 1.7 cm Histologic type: Endometrioid with squamous differentiation. Grade: 2 Myometrial invasion: 0.3 cm where myometrium is 1 cm in thickness Cervical stromal involvement: No. Extent of involvement of other organs: None, identified. Lymph - vascular invasion: Not identified.    05/13/2016 Imaging   05/03/16 CT guided drainage: IMPRESSION: Successful CT guided aspiration of approximately 15 cc of serous, slightly cloudy / milky fluid from the residual collection within the left hemipelvis. All aspirated samples were sent to the laboratory for cytologic and gram stain analysis.  Cytology was negative   07/2017 Progression   Shannon MARLINin February 2019 presented to her primary physician with complaints of rectal bleeding.  She was scheduled for a colonoscopy and had taken the bowel prep when she presented with obstruction of her distal sigmoid colon.   08/18/2017 Imaging   maging 08/18/2017 The left ureter is dilated to the left hemipelvis. It becomes narrowed in the inflammatory pelvic mass. This is described below. Bladder is within normal limits.   Stomach/Bowel: Large hiatal hernia is unchanged. Stable prominent gastric folds within the hiatal hernia.   The colon is diffusely distended. Distal small bowel is distended. A transition point between dilated large bowel and decompressed large bowel occurs in the sigmoid colon. There is a heterogeneous ill-defined mass involving the sigmoid colon and left side of the pelvis which includes the colon wall, adjacent fat, and both fluid and gas elements. Findings are suspicious for a focal perforation with abscess formation. Underlying malignancy is a  strong consideration given the appearance and colon obstruction.  The mass also involves the left ureter causing an element of left ureteral dilatation worrisome for an element of left ureteral obstruction. The gas and fluid collection is very small measuring 1.8 x 1.2 cm on  image 109 of series 4. Overall mass size including the involved colon is 5.7 x 5.3 cm.    08/21/2017 Surgery   Shannon Scott underwent a diverting laparoscopic transverse loop colostomy and subsequent placement of a left ureteral stent.   08/2017 -  Radiation Therapy   She then received pelvic external beam radiation therapy    10/13/2017 - 11/17/2017 Chemotherapy   The patient had dexamethasone (DECADRON) 4 MG tablet, 8 mg, Oral, Daily, 1 of 1 cycle, Start date: --, End date: -- palonosetron (ALOXI) injection 0.25 mg, 0.25 mg, Intravenous,  Once, 6 of 6 cycles Administration: 0.25 mg (10/13/2017), 0.25 mg (10/20/2017), 0.25 mg (11/03/2017), 0.25 mg (11/10/2017), 0.25 mg (11/17/2017) CARBOplatin (PARAPLATIN) 150 mg in sodium chloride 0.9 % 100 mL chemo infusion, 150 mg (100 % of original dose 148 mg), Intravenous,  Once, 6 of 6 cycles Dose modification:   (original dose 148 mg, Cycle 1) Administration: 150 mg (10/13/2017), 150 mg (10/20/2017), 150 mg (11/03/2017), 150 mg (11/10/2017), 150 mg (11/17/2017) PACLitaxel (TAXOL) 132 mg in sodium chloride 0.9 % 250 mL chemo infusion (</= 44m/m2), 80 mg/m2 = 132 mg, Intravenous,  Once, 6 of 6 cycles Dose modification: 45 mg/m2 (original dose 80 mg/m2, Cycle 2, Reason: Other (see comments), Comment: Decreasing dose to 45 mg/m2 while undergoing XRT) Administration: 132 mg (10/13/2017), 72 mg (10/20/2017), 72 mg (11/03/2017), 72 mg (11/10/2017), 72 mg (11/17/2017)   for chemotherapy treatment.     01/18/2018 Imaging   notable for the presence of an IVC filter and 11 mm left internal iliac node that was previously 1.6 cm and a soft tissue mass in the left pelvic sidewall and now measuring 3.9 x 2.5  cm.   03/03/2018 Imaging   PET  demonstrates a hypermetabolic left pelvic sidewall mass, greater than 3 weeks after completion of radiotherapy.   03/16/2018 -  Chemotherapy   Patient is on Treatment Plan : COLORECTAL Pembrolizumab q21d     04/2018 Miscellaneous    CA 125  Ref. Range 05/04/2018 12:08 05/25/2018 11:21 06/15/2018 13:30 10/10/2018 12:20 01/23/2019 11:35  Cancer Antigen (CA) 125 Latest Ref Range: 0.0 - 38.1 U/mL 11.2 10.9 10.2 10.9 9.2    10/05/2018 Imaging   PET  3. Continued positive response to therapy within the left pelvic sidewall mass, which demonstrates residual low level hypermetabolism that has mildly decreased. 4. Resolved mediastinal nodal hypermetabolism. Nearly resolved bilateral hilar nodal hypermetabolism. These findings could represent resolving reactive nodes versus response to therapy within metastatic nodes. 5. No metabolic evidence of new sites of metastatic disease.   09/27/2019 Imaging   CT C/A/P: IMPRESSION: 1. Since the PET of 10/05/2018, similar to slight decrease in left pelvic sidewall soft tissue fullness, without well-defined mass. 2. No findings of  new or progressive disease. 3. Chronic or recurrent left renal pelvic and proximal ureteric wall thickening/mucosal hyperenhancement. Nonspecific, especially in the setting of a prior stent. Cannot exclude ascending infection. Similarly, improved bladder wall and surrounding edema which could simply be treatment related. Correlate with urinalysis to exclude cystitis. 4. Small hiatal hernia. 5. Transverse colostomy with similar small bowel containing parastomal hernia. 6.  Aortic Atherosclerosis (ICD10-I70.0).   03/17/2020 Imaging   CT C/A/P: 1. No evidence of cervical cancer local recurrence or metastatic disease. 2. LEFT midline  colostomy with peristomal hernia. Long segment small bowel enters the hernia sac without obstruction. 3. Chronic fractures sacral insufficiency fractures    06/03/2020 Imaging   CT C/A/P: 1. Status post hysterectomy and bilateral oophorectomy. No findings for recurrent tumor, regional lymphadenopathy or metastatic disease. 2. Stable loop colostomy with sizable parastomal hernia containing small bowel and colon. No obstructive findings. 3. Stable healed sacral insufficiency fractures and remote partially united fracture involving the right pubic symphysis. 4. Advanced atherosclerotic calcifications involving the thoracic and abdominal aorta and branch vessels including the coronary arteries. 5. Aortic atherosclerosis.   10/01/2020 Imaging   CT C/A/P: IMPRESSION: 1. Status post hysterectomy. No evidence of recurrent or metastatic disease in the chest, abdomen, or pelvis. 2. Status post diverting transverse loop colostomy with a large midline parastomal hernia containing multiple nonobstructed loops of transverse colon and small bowel. 3. Unchanged thickening of the urinary bladder wall, likely due to pelvic radiation. 4. Unchanged sclerotic insufficiency fractures of the bilateral sacral ala. Unchanged chronic fracture deformity of the right pubic symphysis. 5. Cholelithiasis. 6. Coronary artery disease.     Interval History: Patient presents today for follow-up.  She notes overall doing well.  Her vulva has been relatively asymptomatic although she developed some redness starting today.  She is using her low potency steroid cream as needed and thinks she has used it 2 or 3 times only for a couple of days since her last visit with me.  When she has symptoms she describes this as feeling irritation, denies any discharge, bleeding, or vulvar pain.  She thinks that dairy products may cause irritation.  She is continued to be bothered by her lower extremity edema, more significant on the left.  She has not been wrapping or using compression for her symptoms.  Has some abdominal pain related to her parastomal hernia.  She denies any vaginal  bleeding or discharge.  Saw Dr. Marin Olp on 10/5.   CT on 11/4 showed no new or progressive findings to suggest recurrent disease.   Past Medical/Surgical History: Past Medical History:  Diagnosis Date   A-fib ([place]) 03/26/2020   Acute CVA (cerebrovascular accident) ([place]) 03/26/2020   Arthritis    Asthma    allergy induced   Bilateral carotid artery disease ([facility])    L carotid bruit   Bursitis    left hip   CAD (coronary artery disease)    Cancer ([place])    Dyslipidemia    intolerant to statins, welchol, niacin, zetia   Goals of care, counseling/discussion 10/11/2017   History of blood transfusion    History of nuclear stress test 04/24/2012   lexiscan; normal study   Hypertension    Hypothyroidism    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 ([place]) 10/11/2017   Postoperative nausea and vomiting 01/02/2016    Past Surgical History:  Procedure Laterality Date   ABDOMINAL HYSTERECTOMY     Carotid Doppler  02/2012   40-59% right int carotid artery stenosis; 60-79% L int carotid stenosis; L carotid bruit   CORONARY ARTERY BYPASS GRAFT  03/12/2004   LIMA to LAD, SVG to circumflex, SVG to PDA   CYSTOSCOPY W/ URETERAL STENT PLACEMENT Left 12/06/2017   Procedure: CYSTOSCOPY WITH LEFT URETERAL STENT EXCHANGE;  Surgeon: Irine Seal, MD;  Location: [facility];  Service: Urology;  Laterality: Left;   CYSTOSCOPY WITH STENT PLACEMENT Bilateral 08/21/2017   Procedure: CYSTOSCOPY WITH STENT PLACEMENT;  Surgeon: Ceasar Mons, MD;  Location: [facility];  Service: Urology;  Laterality: Bilateral;   IR FLUORO GUIDE PORT INSERTION RIGHT  10/11/2017   IR GENERIC HISTORICAL  04/29/2016   IR RADIOLOGIST EVAL & MGMT 04/29/2016 Sandi Mariscal, MD [facility]   IR GENERIC HISTORICAL  05/12/2016   IR RADIOLOGIST EVAL & MGMT 05/12/2016 Sandi Mariscal, MD [facility]   IR IVC FILTER PLMT / S&I Burke Keels GUID/MOD SED  10/11/2017   IR US GUIDE VASC ACCESS RIGHT  10/11/2017    ROBOTIC ASSISTED TOTAL HYSTERECTOMY WITH BILATERAL SALPINGO OOPHERECTOMY Bilateral 12/30/2015   Procedure: XI ROBOTIC ASSISTED TOTAL HYSTERECTOMY WITH BILATERAL SALPINGO OOPHORECTOMY WITH SENTAL LYMPH NODE BIOPSY;  Surgeon: Nancy Marus, MD;  Location: [facility];  Service: Gynecology;  Laterality: Bilateral;   TONSILLECTOMY     TRANSTHORACIC ECHOCARDIOGRAM  04/2007   EF>55%; mild MR; mild-mod TR; mild pulm HTN; mild calcification of aortiv valve leaflets with mild valvular aortic stenosis   TUBAL LIGATION      Family History  Problem Relation Age of Onset   Hypertension Mother    Heart disease Mother        Died in her 60s   Stroke Father    Kidney disease Brother    Heart disease Brother        also HTN, hyperlipidemia   Heart attack Brother    Stroke Sister        x2   Hypertension Sister     Social History   Socioeconomic History   Marital status: Widowed    Spouse name: Not on file   Number of children: 2   Years of education: Not on file   Highest education level: Not on file  Occupational History    Employer: RETIRED  Tobacco Use   Smoking status: Never   Smokeless tobacco: Never  Vaping Use   Vaping Use: Never used  Substance and Sexual Activity   Alcohol use: No   Drug use: No   Sexual activity: Not Currently  Other Topics Concern   Not on file  Social History Narrative   Not on file   Social Determinants of Health   Financial Resource Strain: Not on file  Food Insecurity: Not on file  Transportation Needs: Not on file  Physical Activity: Not on file  Stress: Not on file  Social Connections: Not on file    Current Medications:  Current Outpatient Medications:    acetaminophen (TYLENOL) 500 MG tablet, Take 500 mg by mouth as needed (pain)., Disp: , Rfl:    apixaban (ELIQUIS) 5 MG TABS tablet, Take 1 tablet (5 mg total) by mouth 2 (two) times daily., Disp: 60 tablet, Rfl: 0   clobetasol ointment (TEMOVATE) 4.03 %, Apply 1 application topically 3  (three) times a week., Disp: 30 g, Rfl: 0   conjugated estrogens (PREMARIN) vaginal cream, Place 1 Applicatorful vaginally 3 (three) times a week. Apply nightly for 2 weeks, then 3 times a week., Disp: 42.5 g, Rfl: 1   diltiazem (TIAZAC) 300 MG 24 hr capsule, TAKE 1 CAPSULE(300 MG) BY MOUTH DAILY (Patient taking differently: Take 300 mg by mouth at bedtime.), Disp: 30 capsule, Rfl: 1   Evolocumab (REPATHA SURECLICK) 474 MG/ML SOAJ, Inject 1 Dose into the skin every 14 (fourteen) days., Disp: 2 mL, Rfl: 11   levothyroxine (SYNTHROID, LEVOTHROID) 50 MCG tablet, Take 1 tablet (50 mcg total) by mouth daily before breakfast., Disp: 30 tablet, Rfl: 1   lidocaine (XYLOCAINE) 5 % ointment, Apply 1 application topically as needed. Up to TID for treatment  of burning symptoms of the vulva, Disp: 35.44 g, Rfl: 0   lidocaine-prilocaine (EMLA) cream, Apply a dime size to port-a-cath 1-2 hours prior to access. Cover with Saran wrap., Disp: 30 g, Rfl: 2   mometasone (ELOCON) 0.1 % cream, Apply 1 application topically daily. Use nightly for 1-2 weeks, then use every other night (Patient taking differently: Apply 1 application topically daily as needed. Use nightly for 1-2 weeks, then use every other night), Disp: 45 g, Rfl: 0   polyethylene glycol (MIRALAX / GLYCOLAX) 17 g packet, Take 17 g by mouth daily as needed for severe constipation., Disp: 14 each, Rfl: 0   traMADol (ULTRAM) 50 MG tablet, TAKE 2 TABLETS(100 MG) BY MOUTH TWICE DAILY, Disp: 120 tablet, Rfl: 2   amoxicillin (AMOXIL) 500 MG capsule, Take 500 mg by mouth as needed. Take 4 capsules 1 hour before dental appointment. (Patient not taking: Reported on 04/08/2021), Disp: , Rfl:    influenza vaccine adjuvanted (FLUAD) 0.5 ML injection, Inject into the muscle., Disp: 0.5 mL, Rfl: 0 No current facility-administered medications for this visit.  Facility-Administered Medications Ordered in Other Visits:    sodium chloride flush (NS) 0.9 % injection 10 mL, 10  mL, Intracatheter, PRN, Volanda Napoleon, MD, 10 mL at 02/05/20 1440  Review of Systems: Pertinent positives include leg swelling, urinary frequency, back pain. Denies appetite changes, fevers, chills, fatigue, unexplained weight changes. Denies hearing loss, neck lumps or masses, mouth sores, ringing in ears or voice changes. Denies cough or wheezing.  Denies shortness of breath. Denies chest pain or palpitations.  Denies abdominal distention, pain, blood in stools, constipation, diarrhea, nausea, vomiting, or early satiety. Denies pain with intercourse, dysuria, hematuria or incontinence. Denies hot flashes, pelvic pain, vaginal bleeding or vaginal discharge.   Denies joint pain or muscle pain/cramps. Denies itching, rash, or wounds. Denies dizziness, headaches, numbness or seizures. Denies swollen lymph nodes or glands, denies easy bruising or bleeding. Denies anxiety, depression, confusion, or decreased concentration.  Physical Exam: BP (!) 173/53 (BP Location: Left Arm, Patient Position: Sitting)   Pulse 69   Temp 98.2 F (36.8 C) (Tympanic)   Resp 16   Ht 5' 1"  (1.549 m)   Wt 145 lb (65.8 kg)   SpO2 99%   BMI 27.40 kg/m  General: Alert, oriented, no acute distress. HEENT: Normocephalic, atraumatic, sclera anicteric. Chest: Unlabored breathing on room air. Abdomen: soft, nontender.  Normoactive bowel sounds.  Mid abdominal stoma with large parastomal hernia.  Well-healed scar. Extremities: Grossly normal range of motion.  Warm, well perfused.  1+ edema of the right lower extremity, 2+ edema of the left. Lymphatics: No cervical, supraclavicular, or inguinal adenopathy. GU: External female genitalia notable for significant improvement in erythema of bilateral vulva.  There are some areas of tissue thinning, and very mild desquamation.  Single digit vaginal exam, no masses or nodularity.  Laboratory & Radiologic Studies: CT C/A/P on 11/4:IMPRESSION: 1. Stable exam. No new or  progressive findings to suggest recurrent or metastatic disease in the chest, abdomen, or pelvis. 2. Large parastomal ventral hernia containing a portion of the distal stomach, small bowel and colon without complicating features. 3. Sacral insufficiency fractures again noted with chronic posttraumatic deformity at the symphysis pubis 4. Cholelithiasis. 5. Aortic Atherosclerosis (ICD10-I70.0).  Assessment & Plan: Shannon Scott is a 80 y.o. woman with recurrent endometrial cancer, imaging continues to be negative for any evidence of disease.  She had been on pembrolizumab for maintenance after recurrence although has been  off treatment for over a year now.  Most recent CT shows no presence of recurrent disease.  Patient is overall doing well from a cancer standpoint and is NED.  With regard to her vulvar symptoms, she has been fairly well controlled on intermittent low dose topical steroids when she develops symptoms.  I encouraged her to continue using the steroid cream as needed.  If she were to develop symptoms that were treated when she starts steroid cream again, have asked her to call the clinic.  She voices interest in a referral to lymphedema clinic.  She is not taking anything currently such as wrapping or compression therapy.  She thinks that her primary care provider may have placed a referral.  I have asked her to call the clinic back if she would like me to place 1.  She voiced some interest discussion about surgery to repair her parastomal hernia.  I think given her disease-free interval of treatment, it is reasonable to discuss reversal of her loop colostomy and repair of her hernia.  I offered referral to general surgery for further discussion of such a procedure.  The patient would like to think about it and will let me know.  From an endometrial cancer standpoint, the patient had follow-up with Dr. Marin Olp.  I will see her back in 4 months.  38 minutes of total time was  spent for this patient encounter, including preparation, face-to-face counseling with the patient and coordination of care, and documentation of the encounter.  Jeral Pinch, MD  Division of Gynecologic Oncology  Department of Obstetrics and Gynecology  [facility]

## 2021-04-15 DIAGNOSIS — Z933 Colostomy status: Secondary | ICD-10-CM | POA: Diagnosis not present

## 2021-04-15 DIAGNOSIS — K56609 Unspecified intestinal obstruction, unspecified as to partial versus complete obstruction: Secondary | ICD-10-CM | POA: Diagnosis not present

## 2021-05-04 DIAGNOSIS — K56609 Unspecified intestinal obstruction, unspecified as to partial versus complete obstruction: Secondary | ICD-10-CM | POA: Diagnosis not present

## 2021-05-04 DIAGNOSIS — Z933 Colostomy status: Secondary | ICD-10-CM | POA: Diagnosis not present

## 2021-05-06 DIAGNOSIS — Z933 Colostomy status: Secondary | ICD-10-CM | POA: Diagnosis not present

## 2021-05-06 DIAGNOSIS — K56609 Unspecified intestinal obstruction, unspecified as to partial versus complete obstruction: Secondary | ICD-10-CM | POA: Diagnosis not present

## 2021-05-17 ENCOUNTER — Other Ambulatory Visit: Payer: Self-pay

## 2021-05-19 ENCOUNTER — Encounter: Payer: Self-pay | Admitting: Rehabilitation

## 2021-05-19 ENCOUNTER — Other Ambulatory Visit: Payer: Self-pay

## 2021-05-19 ENCOUNTER — Ambulatory Visit: Payer: Medicare Other | Attending: Internal Medicine | Admitting: Rehabilitation

## 2021-05-19 DIAGNOSIS — I890 Lymphedema, not elsewhere classified: Secondary | ICD-10-CM | POA: Insufficient documentation

## 2021-05-19 DIAGNOSIS — C539 Malignant neoplasm of cervix uteri, unspecified: Secondary | ICD-10-CM | POA: Diagnosis present

## 2021-05-19 DIAGNOSIS — C799 Secondary malignant neoplasm of unspecified site: Secondary | ICD-10-CM | POA: Diagnosis not present

## 2021-05-19 NOTE — Therapy (Signed)
[facility] [place], [place], [address] Phone: [phone]   Fax:  [phone]  Physical Therapy Evaluation  Patient Details  Name: Shannon Scott MRN: [id number] Date of Birth: [birth year] Dr. Leeroy Cha  Encounter Date: 05/19/2021   PT End of Session - 05/19/21 1638     Visit Number 1    Number of Visits 9    Date for PT Re-Evaluation 06/29/21    PT Start Time 3007    PT Stop Time 6226    PT Time Calculation (min) 45 min    Activity Tolerance Patient tolerated treatment well    Behavior During Therapy [facility] for tasks assessed/performed             Past Medical History:  Diagnosis Date   A-fib ([place]) 03/26/2020   Acute CVA (cerebrovascular accident) ([place]) 03/26/2020   Arthritis    Asthma    allergy induced   Bilateral carotid artery disease ([place])    L carotid bruit   Bursitis    left hip   CAD (coronary artery disease)    Cancer ([place])    Dyslipidemia    intolerant to statins, welchol, niacin, zetia   Goals of care, counseling/discussion 10/11/2017   History of blood transfusion    History of nuclear stress test 04/24/2012   lexiscan; normal study   Hypertension    Hypothyroidism    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 ([place]) 10/11/2017   Postoperative nausea and vomiting 01/02/2016    Past Surgical History:  Procedure Laterality Date   ABDOMINAL HYSTERECTOMY     Carotid Doppler  02/2012   40-59% right int carotid artery stenosis; 60-79% L int carotid stenosis; L carotid bruit   CORONARY ARTERY BYPASS GRAFT  03/12/2004   LIMA to LAD, SVG to circumflex, SVG to PDA   CYSTOSCOPY W/ URETERAL STENT PLACEMENT Left 12/06/2017   Procedure: CYSTOSCOPY WITH LEFT URETERAL STENT EXCHANGE;  Surgeon: Irine Seal, MD;  Location: [facility];  Service: Urology;  Laterality: Left;   CYSTOSCOPY WITH STENT PLACEMENT Bilateral 08/21/2017   Procedure: CYSTOSCOPY  WITH STENT PLACEMENT;  Surgeon: Ceasar Mons, MD;  Location: [facility];  Service: Urology;  Laterality: Bilateral;   IR FLUORO GUIDE PORT INSERTION RIGHT  10/11/2017   IR GENERIC HISTORICAL  04/29/2016   IR RADIOLOGIST EVAL & MGMT 04/29/2016 Sandi Mariscal, MD [facility] INTERV RAD   IR GENERIC HISTORICAL  05/12/2016   IR RADIOLOGIST EVAL & MGMT 05/12/2016 Sandi Mariscal, MD [facility] INTERV RAD   IR IVC FILTER PLMT / S&I Burke Keels GUID/MOD SED  10/11/2017   IR US GUIDE VASC ACCESS RIGHT  10/11/2017   ROBOTIC ASSISTED TOTAL HYSTERECTOMY WITH BILATERAL SALPINGO OOPHERECTOMY Bilateral 12/30/2015   Procedure: XI ROBOTIC ASSISTED TOTAL HYSTERECTOMY WITH BILATERAL SALPINGO OOPHORECTOMY WITH SENTAL LYMPH NODE BIOPSY;  Surgeon: Nancy Marus, MD;  Location: [facility];  Service: Gynecology;  Laterality: Bilateral;   TONSILLECTOMY     TRANSTHORACIC ECHOCARDIOGRAM  04/2007   EF>55%; mild MR; mild-mod TR; mild pulm HTN; mild calcification of aortiv valve leaflets with mild valvular aortic stenosis   TUBAL LIGATION      There were no vitals filed for this visit.    Subjective Assessment - 05/19/21 1635     Subjective My leg is just getting bigger    Pertinent History Pt with history of locally advanced/recurrent endometrial carcinoma with past radiation x 5 weeks and taxol/carboplatinum for 6 cycles.  History  of kidney stent placement 12/06/17, colostomy in 2019, total hysterectomy in 2017 with 5 lymph nodes removed. Recent PET scan showing severe bilateral sacral insufficiency fractures, Rt pubic symphysis fracture, healing 8-9-10th rib fractures, and metastatic lymph nodes. Last EF 65-70%, recent negative DVT scan for the LEs.  Reports good heart and kidneys. Osteoporosis.  Stroke since here last 02/2020 affecting mainly just the speech and a pretty bad case of shingles over the left leg. has colostomy bag    Limitations Walking    Patient Stated Goals what to do for the swelling    Currently in Pain? No/denies                 [facility] PT Assessment - 05/19/21 0001       Assessment   Medical Diagnosis uterine carcinoma    Referring Provider (PT) [name], NP    Onset Date/Surgical Date 06/01/15    Hand Dominance Right    Prior Therapy yes      Precautions   Precaution Comments lymphedema Lt LE, colostomy bag, history of pelvic/abdominal radiation      Balance Screen   Has the patient fallen in the past 6 months No    Has the patient had a decrease in activity level because of a fear of falling?  Yes    Is the patient reluctant to leave their home because of a fear of falling?  No      Home Social worker Private residence    Living Arrangements Alone    Additional Comments children visit and live nearby      Prior Function   Level of Independence Needs assistance with homemaking    Vocation Retired      Associate Professor   Overall Cognitive Status Within Functional Limits for tasks assessed      Observation/Other Assessments   Observations left LE much larger than last visits with mild redness, hyper keratosis. No actie signs of cellulitis but it is red               LYMPHEDEMA/ONCOLOGY QUESTIONNAIRE - 05/19/21 0001       Type   Cancer Type uterine carcinoma      Surgeries   Number Lymph Nodes Removed 5      Treatment   Active Radiation Treatment No    Past Radiation Treatment Yes      What other symptoms do you have   Are you Having Heaviness or Tightness Yes    Are you having Pain No    Are you having pitting edema Yes    Is it Hard or Difficult finding clothes that fit Yes    Stemmer Sign Yes      Lymphedema Stage   Stage STAGE 3 ELEPHANTIASIS      Lymphedema Assessments   Lymphedema Assessments Lower extremities      Left Lower Extremity Lymphedema   20 cm Proximal to Suprapatella 58 cm    10 cm Proximal to Suprapatella 54.5 cm    At Midpatella/Popliteal Crease 53 cm    30 cm Proximal to Floor at Lateral Plantar Foot 49.5 cm    20 cm  Proximal to Floor at Lateral Plantar Foot 44.2 cm    10 cm Proximal to Floor at Lateral Malleoli 36.5 cm    5 cm Proximal to 1st MTP Joint 23.5 cm    Across MTP Joint --    Around Proximal Great Toe 9.5 cm    Other with tension  Other pitting edema from foot to knee +3                     Objective measurements completed on examination: See above findings.                     PT Long Term Goals - 05/19/21 1642       PT LONG TERM GOAL #1   Title Pt will reduce the left leg by at least 4cm in the lower leg to decrease risk of skin breakdown    Status On-going      PT LONG TERM GOAL #2   Title Pt will be ind with final compression for maintenance of reduction    Status On-going      PT LONG TERM GOAL #3   Title Pt will be ind with self MLD or flexitouch    Status On-going                    Plan - 05/19/21 1638     Clinical Impression Statement Discussion regarding treatment options as pt has attempted bandaging x 2 here and has limited success due to living alone and having only intermittent help with application.  Showed pt circaid reduction system which we will purchase through [employer] through [employer].  Form was signed today and will wait for prescription to be sent over.  After pt gets system we will fit her and perform CDT using circaid.  Thigh remains unchanged from 5 months ago but the lower leg is 2-3cm larger.    Personal Factors and Comorbidities Age;Fitness;Past/Current Experience;Comorbidity 2;Transportation    Comorbidities radiation history, lymph node resection    Examination-Activity Limitations Locomotion Level;Hygiene/Grooming    Examination-Participation Restrictions Meal Prep;Community Activity;Driving    Stability/Clinical Decision Making Evolving/Moderate complexity    Clinical Decision Making Moderate    Rehab Potential Good    PT Frequency 2x / week    PT Duration 6 weeks    PT Treatment/Interventions  ADLs/Self Care Home Management;Therapeutic exercise;Therapeutic activities;Patient/family education;Manual lymph drainage;Manual techniques;Taping;Vasopneumatic Device    PT Next Visit Plan fit and educate on circaid reduction kit - MLD Lt LE if able (no abdominals due to radiation history and colostomy bag)    Consulted and Agree with Plan of Care Patient             Patient will benefit from skilled therapeutic intervention in order to improve the following deficits and impairments:  Decreased knowledge of use of DME, Decreased range of motion, Difficulty walking, Increased edema  Visit Diagnosis: Lymphedema, not elsewhere classified  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 [facility])     Problem List Patient Active Problem List   Diagnosis Date Noted   Vulvar irritation 01/02/2021   Stroke-like episode    Acute CVA (cerebrovascular accident) ([place]) 03/28/2020   Stroke ([place]) 03/26/2020   Expressive aphasia 03/26/2020   Acute vaginitis 01/03/2020   Acute vulvitis 01/03/2020   Lower extremity edema 11/09/2018   Back pain 11/09/2018   IDA (iron deficiency anemia) 10/21/2017   Abdominal mass    Deep vein thrombosis (DVT) of left lower extremity (HCC)    Pelvic mass    Acute blood loss anemia    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 ([place]) 10/11/2017   Goals of care, counseling/discussion 10/11/2017   Rectal bleeding 10/03/2017   Normocytic anemia 10/03/2017   Left hip pain 10/03/2017  Generalized weakness 10/03/2017   Colostomy status ([place]) 08/30/2017   Ureteral stent displacement, subsequent encounter 08/30/2017   Atrial fibrillation with RVR ([place]) 08/30/2017   Hypophosphatemia 08/30/2017   Hypomagnesemia 08/30/2017   Acute blood loss as cause of postoperative anemia 08/30/2017   Chronic anemia 08/30/2017   Left leg swelling 08/30/2017   GERD (gastroesophageal reflux disease) 08/30/2017    Atrial fibrillation, chronic (HCC)    Bowel perforation ([place]) 08/18/2017   Colonic mass 08/18/2017   Ureteral dilatation 08/18/2017   Intractable right heel pain 10/07/2016   Plantar fasciitis of right foot 10/07/2016   Postoperative nausea and vomiting 01/02/2016   Hyponatremia 01/01/2016   Acute hypokalemia 01/01/2016   Endometrial cancer ([place]) 12/30/2015   Familial hypercholesteremia 07/28/2015   Aortic valve stenosis 08/02/2014   S/P CABG x 3 01/13/2013   Palpitations 01/13/2013   Medication intolerance 01/13/2013   Chronic pain 01/13/2013   TIA (transient ischemic attack) 01/13/2013   Carotid stenosis 01/13/2013   Hypothyroid 10/05/2010   Essential hypertension 10/05/2010   Gastroesophagitis 10/05/2010   Atrophic gastritis 10/05/2010    Stark Bray, PT 05/19/2021, 4:42 PM  [facility] @ [address] [place], [place], [address] Phone: [phone]   Fax:  [phone]  Name: ADELAIDE PFEFFERKORN MRN: [id number] Date of Birth: 1941/04/03

## 2021-05-20 DIAGNOSIS — Z933 Colostomy status: Secondary | ICD-10-CM | POA: Diagnosis not present

## 2021-05-20 DIAGNOSIS — K56609 Unspecified intestinal obstruction, unspecified as to partial versus complete obstruction: Secondary | ICD-10-CM | POA: Diagnosis not present

## 2021-05-22 ENCOUNTER — Other Ambulatory Visit: Payer: Self-pay | Admitting: *Deleted

## 2021-05-22 NOTE — Telephone Encounter (Signed)
Opened in error

## 2021-05-26 ENCOUNTER — Telehealth: Payer: Self-pay

## 2021-05-26 NOTE — Telephone Encounter (Signed)
PA for Repatha submitted via CMM Key: [id number]

## 2021-05-26 NOTE — Telephone Encounter (Signed)
Patient Approved for Repatha. [id number] Through 05/30/2022

## 2021-05-27 ENCOUNTER — Other Ambulatory Visit: Payer: Self-pay | Admitting: Internal Medicine

## 2021-05-27 DIAGNOSIS — Z951 Presence of aortocoronary bypass graft: Secondary | ICD-10-CM

## 2021-05-27 DIAGNOSIS — E785 Hyperlipidemia, unspecified: Secondary | ICD-10-CM

## 2021-06-03 ENCOUNTER — Other Ambulatory Visit: Payer: Self-pay | Admitting: *Deleted

## 2021-06-03 ENCOUNTER — Ambulatory Visit: Payer: Medicare Other | Admitting: Rehabilitation

## 2021-06-03 DIAGNOSIS — D508 Other iron deficiency anemias: Secondary | ICD-10-CM

## 2021-06-03 DIAGNOSIS — C541 Malignant neoplasm of endometrium: Secondary | ICD-10-CM

## 2021-06-03 DIAGNOSIS — Z95828 Presence of other vascular implants and grafts: Secondary | ICD-10-CM

## 2021-06-03 DIAGNOSIS — C539 Malignant neoplasm of cervix uteri, unspecified: Secondary | ICD-10-CM

## 2021-06-04 ENCOUNTER — Inpatient Hospital Stay (HOSPITAL_BASED_OUTPATIENT_CLINIC_OR_DEPARTMENT_OTHER): Payer: Medicare Other | Admitting: Hematology & Oncology

## 2021-06-04 ENCOUNTER — Encounter: Payer: Self-pay | Admitting: Hematology & Oncology

## 2021-06-04 ENCOUNTER — Inpatient Hospital Stay: Payer: Medicare Other

## 2021-06-04 ENCOUNTER — Other Ambulatory Visit: Payer: Self-pay

## 2021-06-04 ENCOUNTER — Inpatient Hospital Stay: Payer: Medicare Other | Attending: Hematology & Oncology

## 2021-06-04 VITALS — BP 174/47 | HR 66 | Temp 98.2°F | Resp 18 | Wt 146.0 lb

## 2021-06-04 DIAGNOSIS — Z95828 Presence of other vascular implants and grafts: Secondary | ICD-10-CM

## 2021-06-04 DIAGNOSIS — C539 Malignant neoplasm of cervix uteri, unspecified: Secondary | ICD-10-CM

## 2021-06-04 DIAGNOSIS — C799 Secondary malignant neoplasm of unspecified site: Secondary | ICD-10-CM

## 2021-06-04 DIAGNOSIS — Z1509 Genetic susceptibility to other malignant neoplasm: Secondary | ICD-10-CM | POA: Insufficient documentation

## 2021-06-04 DIAGNOSIS — I890 Lymphedema, not elsewhere classified: Secondary | ICD-10-CM | POA: Insufficient documentation

## 2021-06-04 DIAGNOSIS — Z923 Personal history of irradiation: Secondary | ICD-10-CM | POA: Diagnosis not present

## 2021-06-04 DIAGNOSIS — C541 Malignant neoplasm of endometrium: Secondary | ICD-10-CM | POA: Diagnosis not present

## 2021-06-04 DIAGNOSIS — Z8542 Personal history of malignant neoplasm of other parts of uterus: Secondary | ICD-10-CM | POA: Diagnosis present

## 2021-06-04 DIAGNOSIS — D509 Iron deficiency anemia, unspecified: Secondary | ICD-10-CM | POA: Diagnosis not present

## 2021-06-04 DIAGNOSIS — D508 Other iron deficiency anemias: Secondary | ICD-10-CM

## 2021-06-04 DIAGNOSIS — Z79899 Other long term (current) drug therapy: Secondary | ICD-10-CM | POA: Insufficient documentation

## 2021-06-04 LAB — CMP (CANCER CENTER ONLY)
ALT: 8 U/L (ref 0–44)
AST: 14 U/L — ABNORMAL LOW (ref 15–41)
Albumin: 4 g/dL (ref 3.5–5.0)
Alkaline Phosphatase: 74 U/L (ref 38–126)
Anion gap: 7 (ref 5–15)
BUN: 11 mg/dL (ref 8–23)
CO2: 29 mmol/L (ref 22–32)
Calcium: 9.9 mg/dL (ref 8.9–10.3)
Chloride: 103 mmol/L (ref 98–111)
Creatinine: 0.85 mg/dL (ref 0.44–1.00)
GFR, Estimated: >60 mL/min (ref >60)
Glucose, Bld: 98 mg/dL (ref 70–99)
Potassium: 3.9 mmol/L (ref 3.5–5.1)
Sodium: 139 mmol/L (ref 135–145)
Total Bilirubin: 0.3 mg/dL (ref 0.3–1.2)
Total Protein: 6.7 g/dL (ref 6.5–8.1)

## 2021-06-04 LAB — CBC WITH DIFFERENTIAL (CANCER CENTER ONLY)
Abs Immature Granulocytes: 0.01 10*3/uL (ref 0.00–0.07)
Basophils Absolute: 0 10*3/uL (ref 0.0–0.1)
Basophils Relative: 0 %
Eosinophils Absolute: 0 10*3/uL (ref 0.0–0.5)
Eosinophils Relative: 0 %
HCT: 33.8 % — ABNORMAL LOW (ref 36.0–46.0)
Hemoglobin: 10.9 g/dL — ABNORMAL LOW (ref 12.0–15.0)
Immature Granulocytes: 0 %
Lymphocytes Relative: 20 %
Lymphs Abs: 0.9 10*3/uL (ref 0.7–4.0)
MCH: 29.2 pg (ref 26.0–34.0)
MCHC: 32.2 g/dL (ref 30.0–36.0)
MCV: 90.6 fL (ref 80.0–100.0)
Monocytes Absolute: 0.3 10*3/uL (ref 0.1–1.0)
Monocytes Relative: 7 %
Neutro Abs: 3.3 10*3/uL (ref 1.7–7.7)
Neutrophils Relative %: 73 %
Platelet Count: 257 10*3/uL (ref 150–400)
RBC: 3.73 MIL/uL — ABNORMAL LOW (ref 3.87–5.11)
RDW: 13.8 % (ref 11.5–15.5)
WBC Count: 4.6 10*3/uL (ref 4.0–10.5)
nRBC: 0 % (ref 0.0–0.2)

## 2021-06-04 LAB — RETICULOCYTES
Immature Retic Fract: 4 % (ref 2.3–15.9)
RBC.: 3.73 MIL/uL — ABNORMAL LOW (ref 3.87–5.11)
Retic Count, Absolute: 34.3 10*3/uL (ref 19.0–186.0)
Retic Ct Pct: 0.9 % (ref 0.4–3.1)

## 2021-06-04 LAB — LACTATE DEHYDROGENASE: LDH: 115 U/L (ref 98–192)

## 2021-06-04 MED ORDER — SODIUM CHLORIDE 0.9% FLUSH
10.0000 mL | Freq: Once | INTRAVENOUS | Status: AC
  Administered 2021-06-04: 10 mL via INTRAVENOUS

## 2021-06-04 MED ORDER — ZOLEDRONIC ACID 4 MG/100ML IV SOLN
4.0000 mg | Freq: Once | INTRAVENOUS | Status: AC
  Administered 2021-06-04: 4 mg via INTRAVENOUS
  Filled 2021-06-04: qty 100

## 2021-06-04 MED ORDER — HEPARIN SOD (PORK) LOCK FLUSH 100 UNIT/ML IV SOLN
500.0000 [IU] | Freq: Once | INTRAVENOUS | Status: AC | PRN
  Administered 2021-06-04: 500 [IU]

## 2021-06-04 MED ORDER — SODIUM CHLORIDE 0.9 % IV SOLN
200.0000 mg | Freq: Once | INTRAVENOUS | Status: AC
  Administered 2021-06-04: 200 mg via INTRAVENOUS
  Filled 2021-06-04: qty 10

## 2021-06-04 MED ORDER — SODIUM CHLORIDE 0.9 % IV SOLN: Freq: Once | INTRAVENOUS | Status: AC

## 2021-06-04 MED ORDER — SODIUM CHLORIDE 0.9% FLUSH
10.0000 mL | Freq: Once | INTRAVENOUS | Status: AC | PRN
  Administered 2021-06-04: 10 mL

## 2021-06-04 NOTE — Progress Notes (Signed)
Hematology and Oncology Follow Up Visit  Shannon Scott [id number] 1940-06-18 81 y.o. 06/04/2021   Principle Diagnosis:  Locally advanced/recurrent endometrial carcinoma undifferentiated --  TMB (HIGH) / MSI HIGH  Intermittent iron deficiency anemia   Past Therapy: Radiation therapy for 5 -5 1/2 weeks - s/p 20 fractions Taxol/carboplatinum q 7 days -- s/p cycle 6    Current Therapy:        Pembrolizumab 400 mg q 12 weeks, s/p cycle 20 (originally started on 01/23/2019 at 400 mg IV q 6 wk)  --on hold for patient request since April 2022 Zometa 4 mg IV q 3 months -- next dose on 08/2021 IV iron as indicated --Venofer given on 12/16/2020   Interim History:  Shannon Scott is here today for follow-up.  She really looks quite good.  She has been off treatment since April 2022.  We did do a CT scan on her.  This was done on 04/03/2021.  This did not show any evidence of progressive disease.  I think she is incredibly fortunate that she has the Lynch syndrome and that her tumor is incredibly responsive to immunotherapy.  She had a nice Thanksgiving and Christmas and New Year's with her family.  She has had no problems with pain.  She has had no problems with cough or shortness of breath.  Thankfully, there is been no issues with COVID.Marland Kitchen  She does have chronic lymphedema in the legs.  She has had no bleeding.  There has been no obvious change in bowel or bladder habits.  She has a colostomy which is functioning well.  Overall, her performance status is probably ECOG 2.     Medications:  Allergies as of 06/04/2021       Reactions   Statins Other (See Comments)   Myalgias and memory problems   Ciprofloxacin Itching, Other (See Comments)   Splotchy redness with itching during IV infusion localized to arm.        Medication List        Accurate as of June 04, 2021  1:38 PM. If you have any questions, ask your nurse or doctor.          STOP taking these medications     acetaminophen 500 MG tablet Commonly known as: TYLENOL Stopped by: Volanda Napoleon, MD   amoxicillin 500 MG capsule Commonly known as: AMOXIL Stopped by: Volanda Napoleon, MD   conjugated estrogens 0.625 MG/GM vaginal cream Commonly known as: PREMARIN Stopped by: Volanda Napoleon, MD   lidocaine 5 % ointment Commonly known as: XYLOCAINE Stopped by: Volanda Napoleon, MD   lidocaine-prilocaine cream Commonly known as: EMLA Stopped by: Volanda Napoleon, MD   mometasone 0.1 % cream Commonly known as: Elocon Stopped by: Volanda Napoleon, MD       TAKE these medications    apixaban 5 MG Tabs tablet Commonly known as: ELIQUIS Take 1 tablet (5 mg total) by mouth 2 (two) times daily.   clobetasol ointment 0.05 % Commonly known as: TEMOVATE Apply 1 application topically 3 (three) times a week.   diltiazem 300 MG 24 hr capsule Commonly known as: TIAZAC TAKE 1 CAPSULE(300 MG) BY MOUTH DAILY What changed: See the new instructions.   Fluad Quadrivalent 0.5 ML injection Generic drug: influenza vaccine adjuvanted Inject into the muscle.   levothyroxine 50 MCG tablet Commonly known as: SYNTHROID Take 1 tablet (50 mcg total) by mouth daily before breakfast.   polyethylene glycol 17 g packet Commonly known  as: MIRALAX / GLYCOLAX Take 17 g by mouth daily as needed for severe constipation.   Repatha SureClick 883 MG/ML [place] Generic drug: Evolocumab INJECT 1 DOSE(140MG) UNDER THE SKIN EVERY 14 DAYS   traMADol 50 MG tablet Commonly known as: ULTRAM TAKE 2 TABLETS(100 MG) BY MOUTH TWICE DAILY        Allergies:  Allergies  Allergen Reactions   Statins Other (See Comments)    Myalgias and memory problems   Ciprofloxacin Itching and Other (See Comments)    Splotchy redness with itching during IV infusion localized to arm.    Past Medical History, Surgical history, Social history, and Family History were reviewed and updated.  Review of Systems: Review of Systems   Constitutional: Negative.   HENT: Negative.    Eyes: Negative.   Respiratory: Negative.    Cardiovascular:  Positive for leg swelling.  Gastrointestinal: Negative.   Genitourinary: Negative.   Musculoskeletal:  Positive for back pain.  Skin: Negative.   Neurological: Negative.   Endo/Heme/Allergies: Negative.   Psychiatric/Behavioral: Negative.      Physical Exam:  weight is 146 lb (66.2 kg). Her oral temperature is 98.2 F (36.8 C). Her blood pressure is 174/47 (abnormal) and her pulse is 66. Her respiration is 18 and oxygen saturation is 99%.   Wt Readings from Last 3 Encounters:  06/04/21 146 lb (66.2 kg)  04/13/21 145 lb (65.8 kg)  03/19/21 145 lb (65.8 kg)    Physical Exam Vitals reviewed.  HENT:     Head: Normocephalic and atraumatic.  Eyes:     Pupils: Pupils are equal, round, and reactive to light.  Cardiovascular:     Rate and Rhythm: Normal rate and regular rhythm.     Heart sounds: Normal heart sounds.     Comments: Cardiac exam shows a regular rate and rhythm.  She has a 1/6 systolic ejection murmur.   Pulmonary:     Effort: Pulmonary effort is normal.     Breath sounds: Normal breath sounds.  Abdominal:     General: Bowel sounds are normal.     Palpations: Abdomen is soft.     Comments: Abdominal exam shows a soft abdomen.  She has a colostomy that is intact.  She has no fluid wave.  There is no obvious abdominal mass.  There is no palpable liver or spleen tip.  Musculoskeletal:        General: No tenderness or deformity. Normal range of motion.     Cervical back: Normal range of motion.     Comments: She has significant lymphedema in her legs.  She has probably 3+ in the left leg and 2+ in the right leg.  I do not see any erythema.  She has pitting edema.  She has decent range of motion of her joints.  Lymphadenopathy:     Cervical: No cervical adenopathy.  Skin:    General: Skin is warm and dry.     Findings: No erythema or rash.  Neurological:      Mental Status: She is alert and oriented to person, place, and time.  Psychiatric:        Behavior: Behavior normal.        Thought Content: Thought content normal.        Judgment: Judgment normal.     Lab Results  Component Value Date   WBC 4.6 06/04/2021   HGB 10.9 (L) 06/04/2021   HCT 33.8 (L) 06/04/2021   MCV 90.6 06/04/2021   PLT 257  06/04/2021   Lab Results  Component Value Date   FERRITIN 463 (H) 03/04/2021   IRON 34 (L) 03/04/2021   TIBC 191 (L) 03/04/2021   UIBC 158 03/04/2021   IRONPCTSAT 18 (L) 03/04/2021   Lab Results  Component Value Date   RETICCTPCT 0.9 06/04/2021   RBC 3.73 (L) 06/04/2021   RBC 3.73 (L) 06/04/2021   No results found for: KPAFRELGTCHN, LAMBDASER, KAPLAMBRATIO No results found for: IGGSERUM, IGA, IGMSERUM No results found for: Odetta Pink, SPEI   Chemistry      Component Value Date/Time   NA 139 06/04/2021 1144   NA 136 (A) 09/08/2017 0000   K 3.9 06/04/2021 1144   CL 103 06/04/2021 1144   CO2 29 06/04/2021 1144   BUN 11 06/04/2021 1144   BUN 8 09/08/2017 0000   CREATININE 0.85 06/04/2021 1144   CREATININE 0.69 08/02/2016 1519   GLU 111 09/08/2017 0000      Component Value Date/Time   CALCIUM 9.9 06/04/2021 1144   ALKPHOS 74 06/04/2021 1144   AST 14 (L) 06/04/2021 1144   ALT 8 06/04/2021 1144   BILITOT 0.3 06/04/2021 1144       Impression and Plan: Shannon Scott is a very pleasant 80 yo caucasian female with recurrent undifferentiated endometrial carcinoma.   She was on immunotherapy.  She has a high MSI.  She really has done well with immunotherapy.  The last CT scan confirms this.  For right now, we will just plan to get her back in 3 months.  I do not think we need another scan probably until the Summer.  She will get iron today.  Her hemoglobin is down a little bit.  She gets her Zometa today.    Volanda Napoleon, MD 1/5/20231:38 PM

## 2021-06-04 NOTE — Patient Instructions (Signed)
Iron Sucrose Injection What is this medication? IRON SUCROSE (EYE ern SOO krose) treats low levels of iron (iron deficiency anemia) in people with kidney disease. Iron is a mineral that plays an important role in making red blood cells, which carry oxygen from your lungs to the rest of your body. This medicine may be used for other purposes; ask your health care provider or pharmacist if you have questions. COMMON BRAND NAME(S): Venofer What should I tell my care team before I take this medication? They need to know if you have any of these conditions: Anemia not caused by low iron levels Heart disease High levels of iron in the blood Kidney disease Liver disease An unusual or allergic reaction to iron, other medications, foods, dyes, or preservatives Pregnant or trying to get pregnant Breast-feeding How should I use this medication? This medication is for infusion into a vein. It is given in a hospital or clinic setting. Talk to your care team about the use of this medication in children. While this medication may be prescribed for children as young as 2 years for selected conditions, precautions do apply. Overdosage: If you think you have taken too much of this medicine contact a poison control center or emergency room at once. NOTE: This medicine is only for you. Do not share this medicine with others. What if I miss a dose? It is important not to miss your dose. Call your care team if you are unable to keep an appointment. What may interact with this medication? Do not take this medication with any of the following: Deferoxamine Dimercaprol Other iron products This medication may also interact with the following: Chloramphenicol Deferasirox This list may not describe all possible interactions. Give your health care provider a list of all the medicines, herbs, non-prescription drugs, or dietary supplements you use. Also tell them if you smoke, drink alcohol, or use illegal drugs.  Some items may interact with your medicine. What should I watch for while using this medication? Visit your care team regularly. Tell your care team if your symptoms do not start to get better or if they get worse. You may need blood work done while you are taking this medication. You may need to follow a special diet. Talk to your care team. Foods that contain iron include: whole grains/cereals, dried fruits, beans, or peas, leafy green vegetables, and organ meats (liver, kidney). What side effects may I notice from receiving this medication? Side effects that you should report to your care team as soon as possible: Allergic reactions--skin rash, itching, hives, swelling of the face, lips, tongue, or throat Low blood pressure--dizziness, feeling faint or lightheaded, blurry vision Shortness of breath Side effects that usually do not require medical attention (report to your care team if they continue or are bothersome): Flushing Headache Joint pain Muscle pain Nausea Pain, redness, or irritation at injection site This list may not describe all possible side effects. Call your doctor for medical advice about side effects. You may report side effects to FDA at 1-800-FDA-1088. Where should I keep my medication? This medication is given in a hospital or clinic and will not be stored at home. NOTE: This sheet is a summary. It may not cover all possible information. If you have questions about this medicine, talk to your doctor, pharmacist, or health care provider.  2022 Elsevier/Gold Standard (2020-10-10 00:00:00) Zoledronic Acid Injection (Hypercalcemia, Oncology) What is this medication? ZOLEDRONIC ACID (ZOE le dron ik AS id) slows calcium loss from bones. It  high calcium levels in the blood from some kinds of cancer. It may be used in other people at risk for bone loss. This medicine may be used for other purposes; ask your health care provider or pharmacist if you have questions. COMMON  BRAND NAME(S): Zometa What should I tell my care team before I take this medication? They need to know if you have any of these conditions: cancer dehydration dental disease kidney disease liver disease low levels of calcium in the blood lung or breathing disease (asthma) receiving steroids like dexamethasone or prednisone an unusual or allergic reaction to zoledronic acid, other medicines, foods, dyes, or preservatives pregnant or trying to get pregnant breast-feeding How should I use this medication? This drug is injected into a vein. It is given by a health care provider in a hospital or clinic setting. Talk to your health care provider about the use of this drug in children. Special care may be needed. Overdosage: If you think you have taken too much of this medicine contact a poison control center or emergency room at once. NOTE: This medicine is only for you. Do not share this medicine with others. What if I miss a dose? Keep appointments for follow-up doses. It is important not to miss your dose. Call your health care provider if you are unable to keep an appointment. What may interact with this medication? certain antibiotics given by injection NSAIDs, medicines for pain and inflammation, like ibuprofen or naproxen some diuretics like bumetanide, furosemide teriparatide thalidomide This list may not describe all possible interactions. Give your health care provider a list of all the medicines, herbs, non-prescription drugs, or dietary supplements you use. Also tell them if you smoke, drink alcohol, or use illegal drugs. Some items may interact with your medicine. What should I watch for while using this medication? Visit your health care provider for regular checks on your progress. It may be some time before you see the benefit from this drug. Some people who take this drug have severe bone, joint, or muscle pain. This drug may also increase your risk for jaw problems or a  broken thigh bone. Tell your health care provider right away if you have severe pain in your jaw, bones, joints, or muscles. Tell you health care provider if you have any pain that does not go away or that gets worse. Tell your dentist and dental surgeon that you are taking this drug. You should not have major dental surgery while on this drug. See your dentist to have a dental exam and fix any dental problems before starting this drug. Take good care of your teeth while on this drug. Make sure you see your dentist for regular follow-up appointments. You should make sure you get enough calcium and vitamin D while you are taking this drug. Discuss the foods you eat and the vitamins you take with your health care provider. Check with your health care provider if you have severe diarrhea, nausea, and vomiting, or if you sweat a lot. The loss of too much body fluid may make it dangerous for you to take this drug. You may need blood work done while you are taking this drug. Do not become pregnant while taking this drug. Women should inform their health care provider if they wish to become pregnant or think they might be pregnant. There is potential for serious harm to an unborn child. Talk to your health care provider for more information. What side effects may I notice from receiving  this medication? Side effects that you should report to your doctor or health care provider as soon as possible: allergic reactions (skin rash, itching or hives; swelling of the face, lips, or tongue) bone pain infection (fever, chills, cough, sore throat, pain or trouble passing urine) jaw pain, especially after dental work joint pain kidney injury (trouble passing urine or change in the amount of urine) low blood pressure (dizziness; feeling faint or lightheaded, falls; unusually weak or tired) low calcium levels (fast heartbeat; muscle cramps or pain; pain, tingling, or numbness in the hands or feet; seizures) low  magnesium levels (fast, irregular heartbeat; muscle cramp or pain; muscle weakness; tremors; seizures) low red blood cell counts (trouble breathing; feeling faint; lightheaded, falls; unusually weak or tired) muscle pain redness, blistering, peeling, or loosening of the skin, including inside the mouth severe diarrhea swelling of the ankles, feet, hands trouble breathing Side effects that usually do not require medical attention (report to your doctor or health care provider if they continue or are bothersome): anxious constipation coughing depressed mood eye irritation, itching, or pain fever general ill feeling or flu-like symptoms nausea pain, redness, or irritation at site where injected trouble sleeping This list may not describe all possible side effects. Call your doctor for medical advice about side effects. You may report side effects to FDA at 1-800-FDA-1088. Where should I keep my medication? This drug is given in a hospital or clinic. It will not be stored at home. NOTE: This sheet is a summary. It may not cover all possible information. If you have questions about this medicine, talk to your doctor, pharmacist, or health care provider.  2022 Elsevier/Gold Standard (2021-02-03 00:00:00)

## 2021-06-04 NOTE — Patient Instructions (Signed)

## 2021-06-05 DIAGNOSIS — I2510 Atherosclerotic heart disease of native coronary artery without angina pectoris: Secondary | ICD-10-CM | POA: Diagnosis not present

## 2021-06-05 DIAGNOSIS — M791 Myalgia, unspecified site: Secondary | ICD-10-CM | POA: Diagnosis not present

## 2021-06-05 DIAGNOSIS — E785 Hyperlipidemia, unspecified: Secondary | ICD-10-CM | POA: Diagnosis not present

## 2021-06-05 DIAGNOSIS — I872 Venous insufficiency (chronic) (peripheral): Secondary | ICD-10-CM | POA: Diagnosis not present

## 2021-06-05 DIAGNOSIS — T466X5A Adverse effect of antihyperlipidemic and antiarteriosclerotic drugs, initial encounter: Secondary | ICD-10-CM | POA: Diagnosis not present

## 2021-06-05 DIAGNOSIS — R54 Age-related physical debility: Secondary | ICD-10-CM | POA: Diagnosis not present

## 2021-06-05 LAB — IRON AND IRON BINDING CAPACITY (CC-WL,HP ONLY)
Iron: 34 ug/dL (ref 28–170)
Saturation Ratios: 17 % (ref 10.4–31.8)
TIBC: 206 ug/dL — ABNORMAL LOW (ref 250–450)
UIBC: 172 ug/dL (ref 148–442)

## 2021-06-05 LAB — FERRITIN: Ferritin: 418 ng/mL — ABNORMAL HIGH (ref 11–307)

## 2021-06-08 ENCOUNTER — Encounter: Payer: Medicare Other | Admitting: Physical Therapy

## 2021-06-08 ENCOUNTER — Encounter: Payer: Self-pay | Admitting: Hematology & Oncology

## 2021-06-10 ENCOUNTER — Encounter: Payer: Self-pay | Admitting: Rehabilitation

## 2021-06-10 ENCOUNTER — Ambulatory Visit: Payer: Medicare Other | Attending: Internal Medicine | Admitting: Rehabilitation

## 2021-06-10 ENCOUNTER — Other Ambulatory Visit: Payer: Self-pay

## 2021-06-10 DIAGNOSIS — C539 Malignant neoplasm of cervix uteri, unspecified: Secondary | ICD-10-CM | POA: Diagnosis present

## 2021-06-10 DIAGNOSIS — C799 Secondary malignant neoplasm of unspecified site: Secondary | ICD-10-CM | POA: Diagnosis not present

## 2021-06-10 DIAGNOSIS — I890 Lymphedema, not elsewhere classified: Secondary | ICD-10-CM | POA: Diagnosis not present

## 2021-06-10 NOTE — Therapy (Signed)
[facility] @ [facility] [address] [place], [place], [address] Phone: [phone]   Fax:  [phone]  Physical Therapy Treatment  Patient Details  Name: Shannon Scott MRN: [id number] Date of Birth: 01/02/1941 Referring Provider (PT): [name], MD   Encounter Date: 06/10/2021   PT End of Session - 06/10/21 1626     Visit Number 2    Number of Visits 9    Date for PT Re-Evaluation 06/29/21    PT Start Time 1510    PT Stop Time 1618    PT Time Calculation (min) 68 min    Activity Tolerance Patient tolerated treatment well    Behavior During Therapy [facility] for tasks assessed/performed             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) 10/11/2017   Postoperative nausea and vomiting 01/02/2016    Past Surgical History:  Procedure Laterality Date   ABDOMINAL HYSTERECTOMY     Carotid Doppler  02/2012   40-59% right int carotid artery stenosis; 60-79% L int carotid stenosis; L carotid bruit   CORONARY ARTERY BYPASS GRAFT  03/12/2004   LIMA to LAD, SVG to circumflex, SVG to PDA   CYSTOSCOPY W/ URETERAL STENT PLACEMENT Left 12/06/2017   Procedure: CYSTOSCOPY WITH LEFT URETERAL STENT EXCHANGE;  Surgeon: Bjorn Pippin, MD;  Location: [facility];  Service: Urology;  Laterality: Left;   CYSTOSCOPY WITH STENT PLACEMENT Bilateral 08/21/2017    Procedure: CYSTOSCOPY WITH STENT PLACEMENT;  Surgeon: Rene Paci, MD;  Location: [facility];  Service: Urology;  Laterality: Bilateral;   IR FLUORO GUIDE PORT INSERTION RIGHT  10/11/2017   IR GENERIC HISTORICAL  04/29/2016   IR RADIOLOGIST EVAL & MGMT 04/29/2016 Simonne Come, MD [facility] RAD   IR GENERIC HISTORICAL  05/12/2016   IR RADIOLOGIST EVAL & MGMT 05/12/2016 Simonne Come, MD [facility] INTER[facility] RAD   IR IVC FILTER PLMT / S&I Lenise Arena GUID/MOD SED  10/11/2017   IR US GUIDE VASC ACCESS RIGHT  10/11/2017   ROBOTIC ASSISTED TOTAL HYSTERECTOMY WITH BILATERAL SALPINGO OOPHERECTOMY Bilateral 12/30/2015   Procedure: XI ROBOTIC ASSISTED TOTAL HYSTERECTOMY WITH BILATERAL SALPINGO OOPHORECTOMY WITH SENTAL LYMPH NODE BIOPSY;  Surgeon: Cleda Mccreedy, MD;  Location: [facility];  Service: Gynecology;  Laterality: Bilateral;   TONSILLECTOMY     TRANSTHORACIC ECHOCARDIOGRAM  04/2007   EF>55%; mild MR; mild-mod TR; mild pulm HTN; mild calcification of aortiv valve leaflets with mild valvular aortic stenosis   TUBAL LIGATION      There were no vitals filed for this visit.   Subjective Assessment - 06/10/21 1509     Subjective I got my stuff    Pertinent History Pt with history of locally advanced/recurrent endometrial carcinoma with past radiation x 5 weeks and taxol/carboplatinum for 6 cycles.  History of kidney stent placement 12/06/17, colostomy in 2019, total hysterectomy in 2017 with 5 lymph nodes removed. Recent PET scan showing severe bilateral sacral insufficiency fractures, Rt pubic symphysis fracture, healing 8-9-10th rib fractures, and metastatic lymph nodes. Last EF 65-70%, recent negative DVT scan for the LEs.  Reports good heart and kidneys. Osteoporosis.  Stroke since here last 02/2020 affecting mainly just the speech and a pretty bad case of shingles over the left leg. has colostomy bag    Currently in Pain? No/denies                               [facility] Adult PT  Treatment/Exercise - 06/10/21 0001       Prosthetics   Prosthetic Care Comments  measured and trimmed to fit circaid reduction kit lower leg, knee, and thigh.  Pt not able to wear compression anklet due to thightness and creasing so Medi was emailed about best option and we will work with [employer] to get one sent to her with exhange of anklet as needed.  Pt and son educated on application using guide, and PT made a makeshift shelf strap AFO piece with a shelf strap if needed until new foot piece comes.  Pt was educated to not wear if it increases the foot.  Pt demonstrated skin marks and decongestion upon wearing lower leg for around 12mn in clinic                          PT Long Term Goals - 05/19/21 1642       PT LONG TERM GOAL #1   Title Pt will reduce the left leg by at least 4cm in the lower leg to decrease risk of skin breakdown    Status On-going      PT LONG TERM GOAL #2   Title Pt will be ind with final compression for maintenance of reduction    Status On-going      PT LONG TERM GOAL #3   Title Pt will be ind with self MLD or flexitouch    Status On-going                   Plan - 06/10/21 1627     Clinical Impression Statement measured and trimmed to fit circaid reduction kit lower leg, knee, and thigh.  Pt not able to wear compression anklet due to thightness and creasing so Medi was emailed about best option and we will work with [employer] to get one sent to her with exhange of anklet as needed.  Pt and son educated on application using guide, and PT made a makeshift shelf strap AFO piece with a shelf strap if needed until new foot piece comes.  Pt was educated to not wear if it increases the foot.  Pt demonstrated skin marks and decongestion upon wearing lower leg for around 368m in clinic    PT Frequency 2x / week    PT Duration 6 weeks    PT Treatment/Interventions ADLs/Self Care Home Management;Therapeutic exercise;Therapeutic  activities;Patient/family education;Manual lymph drainage;Manual techniques;Taping;Vasopneumatic Device    PT Next Visit Plan updates on new foot piece? how was circaid? need to make any adjustments? MLD Lt LE    Consulted and Agree with Plan of Care Patient;Family member/caregiver             Patient will benefit from skilled therapeutic intervention in order to  improve the following deficits and impairments:     Visit Diagnosis: Lymphedema, not elsewhere classified  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 [facility])     Problem List Patient Active Problem List   Diagnosis Date Noted   Vulvar irritation 01/02/2021   Stroke-like episode    Acute CVA (cerebrovascular accident) ([place]) 03/28/2020   Stroke ([place]) 03/26/2020   Expressive aphasia 03/26/2020   Acute vaginitis 01/03/2020   Acute vulvitis 01/03/2020   Lower extremity edema 11/09/2018   Back pain 11/09/2018   IDA (iron deficiency anemia) 10/21/2017   Abdominal mass    Deep vein thrombosis (DVT) of left lower extremity (HCC)    Pelvic mass    Acute blood loss anemia    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 ([place]) 10/11/2017   Goals of care, counseling/discussion 10/11/2017   Rectal bleeding 10/03/2017   Normocytic anemia 10/03/2017   Left hip pain 10/03/2017   Generalized weakness 10/03/2017   Colostomy status ([place]) 08/30/2017   Ureteral stent displacement, subsequent encounter 08/30/2017   Atrial fibrillation with RVR ([place]) 08/30/2017   Hypophosphatemia 08/30/2017   Hypomagnesemia 08/30/2017   Acute blood loss as cause of postoperative anemia 08/30/2017   Chronic anemia 08/30/2017   Left leg swelling 08/30/2017   GERD (gastroesophageal reflux disease) 08/30/2017   Atrial fibrillation, chronic (HCC)    Bowel perforation ([place]) 08/18/2017   Colonic mass 08/18/2017   Ureteral dilatation 08/18/2017   Intractable right heel pain  10/07/2016   Plantar fasciitis of right foot 10/07/2016   Postoperative nausea and vomiting 01/02/2016   Hyponatremia 01/01/2016   Acute hypokalemia 01/01/2016   Endometrial cancer ([place]) 12/30/2015   Familial hypercholesteremia 07/28/2015   Aortic valve stenosis 08/02/2014   S/P CABG x 3 01/13/2013   Palpitations 01/13/2013   Medication intolerance 01/13/2013   Chronic pain 01/13/2013   TIA (transient ischemic attack) 01/13/2013   Carotid stenosis 01/13/2013   Hypothyroid 10/05/2010   Essential hypertension 10/05/2010   Gastroesophagitis 10/05/2010   Atrophic gastritis 10/05/2010    Stark Bray, PT 06/10/2021, 4:29 PM  [facility] @ [place], [place], [address] Phone: [phone]   Fax:  [phone]  Name: Shannon Scott MRN: [id number] Date of Birth: February 20, 1941

## 2021-06-15 ENCOUNTER — Encounter: Payer: Medicare Other | Admitting: Physical Therapy

## 2021-06-16 ENCOUNTER — Other Ambulatory Visit: Payer: Self-pay | Admitting: Hematology & Oncology

## 2021-06-16 DIAGNOSIS — G8929 Other chronic pain: Secondary | ICD-10-CM

## 2021-06-16 DIAGNOSIS — M545 Low back pain, unspecified: Secondary | ICD-10-CM

## 2021-06-16 DIAGNOSIS — C541 Malignant neoplasm of endometrium: Secondary | ICD-10-CM

## 2021-06-16 DIAGNOSIS — M792 Neuralgia and neuritis, unspecified: Secondary | ICD-10-CM

## 2021-06-16 NOTE — Telephone Encounter (Signed)
Refill requested for Tramadol 50 mg # 120 with 3 refills. Last refilled 12/19/20. Last OV 06/04/21 and 3 month f/u already scheduled. Please advise, thanks!

## 2021-06-17 ENCOUNTER — Encounter: Payer: Medicare Other | Admitting: Rehabilitation

## 2021-06-22 ENCOUNTER — Other Ambulatory Visit: Payer: Self-pay

## 2021-06-22 ENCOUNTER — Encounter: Payer: Self-pay | Admitting: Rehabilitation

## 2021-06-22 ENCOUNTER — Ambulatory Visit: Payer: Medicare Other | Admitting: Rehabilitation

## 2021-06-22 DIAGNOSIS — C539 Malignant neoplasm of cervix uteri, unspecified: Secondary | ICD-10-CM

## 2021-06-22 DIAGNOSIS — C799 Secondary malignant neoplasm of unspecified site: Secondary | ICD-10-CM | POA: Diagnosis not present

## 2021-06-22 DIAGNOSIS — I890 Lymphedema, not elsewhere classified: Secondary | ICD-10-CM

## 2021-06-22 NOTE — Therapy (Signed)
[place] @ [facility] [place], [place], [address] Phone: [phone]   Fax:  [phone]  Physical Therapy Treatment  Patient Details  Name: Shannon Scott MRN: [id number] Date of Birth: 1940-08-08 Referring Provider (PT): Leeroy Cha, MD   Encounter Date: 06/22/2021   PT End of Session - 06/22/21 1555     Visit Number 3    Number of Visits 9    Date for PT Re-Evaluation 06/29/21    PT Start Time 1504    PT Stop Time 0093    PT Time Calculation (min) 50 min    Activity Tolerance Patient tolerated treatment well    Behavior During Therapy [facility] for tasks assessed/performed             Past Medical History:  Diagnosis Date   A-fib ([place]) 03/26/2020   Acute CVA (cerebrovascular accident) ([place]) 03/26/2020   Arthritis    Asthma    allergy induced   Bilateral carotid artery disease ([place])    L carotid bruit   Bursitis    left hip   CAD (coronary artery disease)    Cancer ([place])    Dyslipidemia    intolerant to statins, welchol, niacin, zetia   Goals of care, counseling/discussion 10/11/2017   History of blood transfusion    History of nuclear stress test 04/24/2012   lexiscan; normal study   Hypertension    Hypothyroidism    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 ([place]) 10/11/2017   Postoperative nausea and vomiting 01/02/2016    Past Surgical History:  Procedure Laterality Date   ABDOMINAL HYSTERECTOMY     Carotid Doppler  02/2012   40-59% right int carotid artery stenosis; 60-79% L int carotid stenosis; L carotid bruit   CORONARY ARTERY BYPASS GRAFT  03/12/2004   LIMA to LAD, SVG to circumflex, SVG to PDA   CYSTOSCOPY W/ URETERAL STENT PLACEMENT Left 12/06/2017   Procedure: CYSTOSCOPY WITH LEFT URETERAL STENT EXCHANGE;  Surgeon: Irine Seal, MD;  Location: [facility];  Service: Urology;  Laterality: Left;   CYSTOSCOPY WITH STENT PLACEMENT Bilateral 08/21/2017    Procedure: CYSTOSCOPY WITH STENT PLACEMENT;  Surgeon: Ceasar Mons, MD;  Location: [facility];  Service: Urology;  Laterality: Bilateral;   IR FLUORO GUIDE PORT INSERTION RIGHT  10/11/2017   IR GENERIC HISTORICAL  04/29/2016   IR RADIOLOGIST EVAL & MGMT 04/29/2016 Sandi Mariscal, MD [facility]   IR GENERIC HISTORICAL  05/12/2016   IR RADIOLOGIST EVAL & MGMT 05/12/2016 Sandi Mariscal, MD [facility] RAD   IR IVC FILTER PLMT / S&I Burke Keels GUID/MOD SED  10/11/2017   IR US GUIDE VASC ACCESS RIGHT  10/11/2017   ROBOTIC ASSISTED TOTAL HYSTERECTOMY WITH BILATERAL SALPINGO OOPHERECTOMY Bilateral 12/30/2015   Procedure: XI ROBOTIC ASSISTED TOTAL HYSTERECTOMY WITH BILATERAL SALPINGO OOPHORECTOMY WITH SENTAL LYMPH NODE BIOPSY;  Surgeon: Nancy Marus, MD;  Location: [facility];  Service: Gynecology;  Laterality: Bilateral;   TONSILLECTOMY     TRANSTHORACIC ECHOCARDIOGRAM  04/2007   EF>55%; mild MR; mild-mod TR; mild pulm HTN; mild calcification of aortiv valve leaflets with mild valvular aortic stenosis   TUBAL LIGATION      There were no vitals filed for this visit.   Subjective Assessment - 06/22/21 1506     Subjective I really like the velcros I got the foot now    Pertinent History Pt with history of locally advanced/recurrent endometrial carcinoma with past radiation x 5 weeks  and taxol/carboplatinum for 6 cycles.  History of kidney stent placement 12/06/17, colostomy in 2019, total hysterectomy in 2017 with 5 lymph nodes removed. Recent PET scan showing severe bilateral sacral insufficiency fractures, Rt pubic symphysis fracture, healing 8-9-10th rib fractures, and metastatic lymph nodes. Last EF 65-70%, recent negative DVT scan for the LEs.  Reports good heart and kidneys. Osteoporosis.  Stroke since here last 02/2020 affecting mainly just the speech and a pretty bad case of shingles over the left leg. has colostomy bag    Currently in Pain? No/denies                   LYMPHEDEMA/ONCOLOGY  QUESTIONNAIRE - 06/22/21 0001       Left Lower Extremity Lymphedema   10 cm Proximal to Suprapatella 53 cm    At Midpatella/Popliteal Crease 53 cm    30 cm Proximal to Floor at Lateral Plantar Foot 49.4 cm    20 cm Proximal to Floor at Lateral Plantar Foot 43.5 cm    10 cm Proximal to Floor at Lateral Malleoli 35.5 cm    5 cm Proximal to 1st MTP Joint 23.5 cm    Around Proximal Great Toe 8.6 cm                        [facility] Adult PT Treatment/Exercise - 06/22/21 0001       Prosthetics   Prosthetic Care Comments  measured and trimmed adjustable foot piece for circaid and then donned the whole unit; underliner, lower leg, thigh, and foot with surgical shoe to leave.  Pt reports no skin issues or pain wearing and has been able to doff ind at home                          PT Long Term Goals - 05/19/21 1642       PT LONG TERM GOAL #1   Title Pt will reduce the left leg by at least 4cm in the lower leg to decrease risk of skin breakdown    Status On-going      PT LONG TERM GOAL #2   Title Pt will be ind with final compression for maintenance of reduction    Status On-going      PT LONG TERM GOAL #3   Title Pt will be ind with self MLD or flexitouch    Status On-going                   Plan - 06/22/21 1556     Clinical Impression Statement Pt showed reduction around 1cm with use of velcro.  Good tolerance to wear with no pain or skin breakdown.  Added foot component today which fits well.  Discussed adding MLD next time with use of velcro compression.    PT Frequency 2x / week    PT Duration 6 weeks    PT Treatment/Interventions ADLs/Self Care Home Management;Therapeutic exercise;Therapeutic activities;Patient/family education;Manual lymph drainage;Manual techniques;Taping;Vasopneumatic Device    PT Next Visit Plan MLD Lt LE with use of velcro for compression    Consulted and Agree with Plan of Care Patient;Family member/caregiver              Patient will benefit from skilled therapeutic intervention in order to improve the following deficits and impairments:     Visit Diagnosis: Lymphedema, not elsewhere classified  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 ([place])  Problem List Patient Active Problem List   Diagnosis Date Noted   Vulvar irritation 01/02/2021   Stroke-like episode    Acute CVA (cerebrovascular accident) ([place]) 03/28/2020   Stroke ([place]) 03/26/2020   Expressive aphasia 03/26/2020   Acute vaginitis 01/03/2020   Acute vulvitis 01/03/2020   Lower extremity edema 11/09/2018   Back pain 11/09/2018   IDA (iron deficiency anemia) 10/21/2017   Abdominal mass    Deep vein thrombosis (DVT) of left lower extremity (HCC)    Pelvic mass    Acute blood loss anemia    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 ([place]) 10/11/2017   Goals of care, counseling/discussion 10/11/2017   Rectal bleeding 10/03/2017   Normocytic anemia 10/03/2017   Left hip pain 10/03/2017   Generalized weakness 10/03/2017   Colostomy status ([place]) 08/30/2017   Ureteral stent displacement, subsequent encounter 08/30/2017   Atrial fibrillation with RVR ([place]) 08/30/2017   Hypophosphatemia 08/30/2017   Hypomagnesemia 08/30/2017   Acute blood loss as cause of postoperative anemia 08/30/2017   Chronic anemia 08/30/2017   Left leg swelling 08/30/2017   GERD (gastroesophageal reflux disease) 08/30/2017   Atrial fibrillation, chronic (HCC)    Bowel perforation ([place]) 08/18/2017   Colonic mass 08/18/2017   Ureteral dilatation 08/18/2017   Intractable right heel pain 10/07/2016   Plantar fasciitis of right foot 10/07/2016   Postoperative nausea and vomiting 01/02/2016   Hyponatremia 01/01/2016   Acute hypokalemia 01/01/2016   Endometrial cancer ([place]) 12/30/2015   Familial hypercholesteremia 07/28/2015   Aortic valve stenosis 08/02/2014   S/P CABG x 3  01/13/2013   Palpitations 01/13/2013   Medication intolerance 01/13/2013   Chronic pain 01/13/2013   TIA (transient ischemic attack) 01/13/2013   Carotid stenosis 01/13/2013   Hypothyroid 10/05/2010   Essential hypertension 10/05/2010   Gastroesophagitis 10/05/2010   Atrophic gastritis 10/05/2010    Stark Bray, PT 06/22/2021, 9:46 PM  [facility] @ [place], [place], [address] Phone: [phone]   Fax:  [phone]  Name: Shannon Scott MRN: [id number] Date of Birth: 07-Jul-1940

## 2021-06-24 ENCOUNTER — Ambulatory Visit: Payer: Medicare Other | Admitting: Rehabilitation

## 2021-06-29 ENCOUNTER — Ambulatory Visit: Payer: Medicare Other | Admitting: Physical Therapy

## 2021-07-01 ENCOUNTER — Encounter: Payer: Self-pay | Admitting: Rehabilitation

## 2021-07-01 ENCOUNTER — Other Ambulatory Visit: Payer: Self-pay

## 2021-07-01 ENCOUNTER — Ambulatory Visit: Payer: Medicare Other | Attending: Internal Medicine | Admitting: Rehabilitation

## 2021-07-01 DIAGNOSIS — I890 Lymphedema, not elsewhere classified: Secondary | ICD-10-CM | POA: Insufficient documentation

## 2021-07-01 DIAGNOSIS — C539 Malignant neoplasm of cervix uteri, unspecified: Secondary | ICD-10-CM | POA: Diagnosis present

## 2021-07-01 DIAGNOSIS — C799 Secondary malignant neoplasm of unspecified site: Secondary | ICD-10-CM | POA: Insufficient documentation

## 2021-07-01 NOTE — Therapy (Addendum)
 [facility] @ [facility] [address] [place], [place], [address] Phone: [phone]   Fax:  [phone]  Physical Therapy Treatment  Patient Details  Name: Shannon Scott MRN: [id number] Date of Birth: [birth year] Referring Provider (PT): [name], MD   Encounter Date: 07/01/2021   PT End of Session - 07/01/21 1515     Visit Number 4    Number of Visits 9    Date for PT Re-Evaluation 06/29/21    PT Start Time 1515    PT Stop Time 1604    PT Time Calculation (min) 49 min    Activity Tolerance Patient tolerated treatment well    Behavior During Therapy [facility] for tasks assessed/performed             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) 10/11/2017   Postoperative nausea and vomiting 01/02/2016    Past Surgical History:  Procedure Laterality Date   ABDOMINAL HYSTERECTOMY     Carotid Doppler  02/2012   40-59% right int carotid artery stenosis; 60-79% L int carotid stenosis; L carotid bruit   CORONARY ARTERY BYPASS GRAFT  03/12/2004   LIMA to LAD, SVG to circumflex, SVG to PDA   CYSTOSCOPY W/ URETERAL STENT PLACEMENT Left 12/06/2017   Procedure: CYSTOSCOPY WITH LEFT URETERAL STENT EXCHANGE;  Surgeon: Bjorn Pippin, MD;  [facility];  Service: Urology;  Laterality: Left;   CYSTOSCOPY WITH STENT PLACEMENT Bilateral 08/21/2017    Procedure: CYSTOSCOPY WITH STENT PLACEMENT;  Surgeon: Rene Paci, MD;  [facility];  Service: Urology;  Laterality: Bilateral;   IR FLUORO GUIDE PORT INSERTION RIGHT  10/11/2017   IR GENERIC HISTORICAL  04/29/2016   IR RADIOLOGIST EVAL & MGMT 04/29/2016 Simonne Come, MD [facility] INTER[facility] RAD   IR GENERIC HISTORICAL  05/12/2016   IR RADIOLOGIST EVAL & MGMT 05/12/2016 Simonne Come, MD [facility] INTER[facility] RAD   IR IVC FILTER PLMT / S&I Lenise Arena GUID/MOD SED  10/11/2017   IR US GUIDE VASC ACCESS RIGHT  10/11/2017   ROBOTIC ASSISTED TOTAL HYSTERECTOMY WITH BILATERAL SALPINGO OOPHERECTOMY Bilateral 12/30/2015   Procedure: XI ROBOTIC ASSISTED TOTAL HYSTERECTOMY WITH BILATERAL SALPINGO OOPHORECTOMY WITH SENTAL LYMPH NODE BIOPSY;  Surgeon: Cleda Mccreedy, MD;  Location: [facility];  Service: Gynecology;  Laterality: Bilateral;   TONSILLECTOMY     TRANSTHORACIC ECHOCARDIOGRAM  04/2007   EF>55%; mild MR; mild-mod TR; mild pulm HTN; mild calcification of aortiv valve leaflets with mild valvular aortic stenosis   TUBAL LIGATION      There were no vitals filed for this visit.   Subjective Assessment - 07/01/21 1516     Subjective Doing much better    Pertinent History Pt with history of locally advanced/recurrent endometrial carcinoma with past radiation x 5 weeks and taxol/carboplatinum for 6 cycles.  History  of kidney stent placement 12/06/17, colostomy in 2019, total hysterectomy in 2017 with 5 lymph nodes removed. Recent PET scan showing severe bilateral sacral insufficiency fractures, Rt pubic symphysis fracture, healing 8-9-10th rib fractures, and metastatic lymph nodes. Last EF 65-70%, recent negative DVT scan for the LEs.  Reports good heart and kidneys. Osteoporosis.  Stroke since here last 02/2020 affecting mainly just the speech and a pretty bad case of shingles over the left leg. has colostomy bag    Currently in Pain? No/denies                   LYMPHEDEMA/ONCOLOGY QUESTIONNAIRE - 07/01/21  0001       Left Lower Extremity Lymphedema   10 cm Proximal to Suprapatella 52 cm    At Midpatella/Popliteal Crease 51 cm    30 cm Proximal to Floor at Lateral Plantar Foot 47.5 cm    20 cm Proximal to Floor at Lateral Plantar Foot 43.5 cm    10 cm Proximal to Floor at Lateral Malleoli 35.3 cm    5 cm Proximal to 1st MTP Joint 23.5 cm    Across MTP Joint 22.5 cm    Around Proximal Great Toe 8.6 cm                        [facility] Adult PT Treatment/Exercise - 07/01/21 0001       Manual Therapy   Manual Therapy Manual Lymphatic Drainage (MLD)    Manual therapy comments donned velcro compression upon leaving    Manual Lymphatic Drainage (MLD) HOB elevated padded for low back comfort Lt inguinal nodes, working from thigh to toes with more deep techniques at the foot and ankle with good softening here - discussed MLD with pt and son as pt is not able to do home MLD so we talked about how she can just continue with compression vs having MLD appts here if she is unable to perform long term.                          PT Long Term Goals - 05/19/21 1642       PT LONG TERM GOAL #1   Title Pt will reduce the left leg by at least 4cm in the lower leg to decrease risk of skin breakdown    Status NOT MET     PT LONG TERM GOAL #2   Title Pt will be ind with final compression for maintenance of reduction    Status MET     PT LONG TERM GOAL #3   Title Pt will be ind with self MLD or flexitouch    Status MET                  Plan - 07/01/21 1709     Clinical Impression Statement Pt continues to show reduction with her velcro garments and family is very happy with the ease of use.  Performed some MLD today with minimal skin stretch ability but had some softening with deeper techniques to the foot and ankle region which pt reported also subjectively.  Due to family have to bring her and inability to perform MLD at home we will recheck in 3 weeks.    PT  Frequency 2x / week    PT Duration 6 weeks    PT Treatment/Interventions ADLs/Self Care Home Management;Therapeutic exercise;Therapeutic activities;Patient/family education;Manual lymph drainage;Manual techniques;Taping;Vasopneumatic Device    PT Next Visit  Plan MLD Lt LE with use of velcro for compression    Consulted and Agree with Plan of Care Patient;Family member/caregiver             Patient will benefit from skilled therapeutic intervention in order to improve the following deficits and impairments:     Visit Diagnosis: Lymphedema, not elsewhere classified  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 [facility])     Problem List Patient Active Problem List   Diagnosis Date Noted   Vulvar irritation 01/02/2021   Stroke-like episode    Acute CVA (cerebrovascular accident) (HCC) 03/28/2020   Stroke (HCC) 03/26/2020   Expressive aphasia 03/26/2020   Acute vaginitis 01/03/2020   Acute vulvitis 01/03/2020   Lower extremity edema 11/09/2018   Back pain 11/09/2018   IDA (iron deficiency anemia) 10/21/2017   Abdominal mass    Deep vein thrombosis (DVT) of left lower extremity (HCC)    Pelvic mass    Acute blood loss anemia    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 (HCC) 10/11/2017   Goals of care, counseling/discussion 10/11/2017   Rectal bleeding 10/03/2017   Normocytic anemia 10/03/2017   Left hip pain 10/03/2017   Generalized weakness 10/03/2017   Colostomy status (HCC) 08/30/2017   Ureteral stent displacement, subsequent encounter 08/30/2017   Atrial fibrillation with RVR (HCC) 08/30/2017   Hypophosphatemia 08/30/2017   Hypomagnesemia 08/30/2017   Acute blood loss as cause of postoperative anemia 08/30/2017   Chronic anemia 08/30/2017   Left leg swelling 08/30/2017   GERD (gastroesophageal reflux disease) 08/30/2017   Atrial fibrillation, chronic (HCC)    Bowel perforation (HCC) 08/18/2017    Colonic mass 08/18/2017   Ureteral dilatation 08/18/2017   Intractable right heel pain 10/07/2016   Plantar fasciitis of right foot 10/07/2016   Postoperative nausea and vomiting 01/02/2016   Hyponatremia 01/01/2016   Acute hypokalemia 01/01/2016   Endometrial cancer (HCC) 12/30/2015   Familial hypercholesteremia 07/28/2015   Aortic valve stenosis 08/02/2014   S/P CABG x 3 01/13/2013   Palpitations 01/13/2013   Medication intolerance 01/13/2013   Chronic pain 01/13/2013   TIA (transient ischemic attack) 01/13/2013   Carotid stenosis 01/13/2013   Hypothyroid 10/05/2010   Essential hypertension 10/05/2010   Gastroesophagitis 10/05/2010   Atrophic gastritis 10/05/2010    Idamae Lusher, PT 07/01/2021, 5:11 PM  [facility] @ [facility] [address] [place], [place], [address] Phone: [phone]   Fax:  [phone]  Name: Shannon Scott MRN: [id number] Date of Birth: 29-Nov-1940   PHYSICAL THERAPY DISCHARGE SUMMARY  Visits from Start of Care: 4  Current functional level related to goals / functional outcomes: Per above   Remaining deficits: Chronic lymphedema    Education / Equipment: Final self care HEP  Plan: Patient agrees to discharge.  Patient goals were partially met.

## 2021-07-06 DIAGNOSIS — Z933 Colostomy status: Secondary | ICD-10-CM | POA: Diagnosis not present

## 2021-07-06 DIAGNOSIS — K56609 Unspecified intestinal obstruction, unspecified as to partial versus complete obstruction: Secondary | ICD-10-CM | POA: Diagnosis not present

## 2021-07-13 ENCOUNTER — Ambulatory Visit: Payer: Medicare Other | Admitting: Adult Health

## 2021-07-22 ENCOUNTER — Encounter: Payer: Self-pay | Admitting: Rehabilitation

## 2021-08-04 DIAGNOSIS — K56609 Unspecified intestinal obstruction, unspecified as to partial versus complete obstruction: Secondary | ICD-10-CM | POA: Diagnosis not present

## 2021-08-04 DIAGNOSIS — Z933 Colostomy status: Secondary | ICD-10-CM | POA: Diagnosis not present

## 2021-08-06 ENCOUNTER — Telehealth: Payer: Self-pay | Admitting: *Deleted

## 2021-08-06 DIAGNOSIS — K56609 Unspecified intestinal obstruction, unspecified as to partial versus complete obstruction: Secondary | ICD-10-CM | POA: Diagnosis not present

## 2021-08-06 DIAGNOSIS — Z933 Colostomy status: Secondary | ICD-10-CM | POA: Diagnosis not present

## 2021-08-06 NOTE — Telephone Encounter (Signed)
Patient called and rescheduled her appt from 3/13 to 3/23 ?

## 2021-08-10 ENCOUNTER — Inpatient Hospital Stay: Payer: Medicare Other | Admitting: Gynecologic Oncology

## 2021-08-12 ENCOUNTER — Ambulatory Visit: Payer: Medicare Other | Admitting: Rehabilitation

## 2021-08-17 ENCOUNTER — Other Ambulatory Visit: Payer: Self-pay | Admitting: Hematology & Oncology

## 2021-08-17 DIAGNOSIS — C541 Malignant neoplasm of endometrium: Secondary | ICD-10-CM

## 2021-08-17 DIAGNOSIS — G8929 Other chronic pain: Secondary | ICD-10-CM

## 2021-08-17 DIAGNOSIS — M792 Neuralgia and neuritis, unspecified: Secondary | ICD-10-CM

## 2021-08-17 DIAGNOSIS — M545 Low back pain, unspecified: Secondary | ICD-10-CM

## 2021-08-20 ENCOUNTER — Inpatient Hospital Stay: Payer: Medicare Other | Admitting: Gynecologic Oncology

## 2021-08-20 DIAGNOSIS — C541 Malignant neoplasm of endometrium: Secondary | ICD-10-CM

## 2021-08-31 ENCOUNTER — Encounter: Payer: Self-pay | Admitting: Gynecologic Oncology

## 2021-08-31 ENCOUNTER — Other Ambulatory Visit: Payer: Self-pay

## 2021-08-31 ENCOUNTER — Inpatient Hospital Stay: Payer: Medicare Other | Attending: Hematology & Oncology | Admitting: Gynecologic Oncology

## 2021-08-31 VITALS — BP 165/72 | HR 78 | Temp 98.5°F | Resp 16 | Ht 61.0 in | Wt 147.8 lb

## 2021-08-31 DIAGNOSIS — Z923 Personal history of irradiation: Secondary | ICD-10-CM | POA: Diagnosis not present

## 2021-08-31 DIAGNOSIS — I890 Lymphedema, not elsewhere classified: Secondary | ICD-10-CM | POA: Diagnosis not present

## 2021-08-31 DIAGNOSIS — Z90710 Acquired absence of both cervix and uterus: Secondary | ICD-10-CM | POA: Diagnosis not present

## 2021-08-31 DIAGNOSIS — C541 Malignant neoplasm of endometrium: Secondary | ICD-10-CM | POA: Diagnosis present

## 2021-08-31 DIAGNOSIS — D509 Iron deficiency anemia, unspecified: Secondary | ICD-10-CM | POA: Diagnosis not present

## 2021-08-31 DIAGNOSIS — Z9221 Personal history of antineoplastic chemotherapy: Secondary | ICD-10-CM | POA: Diagnosis not present

## 2021-08-31 DIAGNOSIS — K435 Parastomal hernia without obstruction or  gangrene: Secondary | ICD-10-CM | POA: Diagnosis not present

## 2021-08-31 DIAGNOSIS — Z90722 Acquired absence of ovaries, bilateral: Secondary | ICD-10-CM | POA: Insufficient documentation

## 2021-08-31 DIAGNOSIS — C7951 Secondary malignant neoplasm of bone: Secondary | ICD-10-CM | POA: Insufficient documentation

## 2021-08-31 DIAGNOSIS — Z7983 Long term (current) use of bisphosphonates: Secondary | ICD-10-CM | POA: Insufficient documentation

## 2021-08-31 DIAGNOSIS — N9089 Other specified noninflammatory disorders of vulva and perineum: Secondary | ICD-10-CM

## 2021-08-31 NOTE — Progress Notes (Signed)
Patient is not currently being treated with Keytruda.   ?Careplan discontinued per Dr. Antonieta Pert instructions. ? ?

## 2021-08-31 NOTE — Patient Instructions (Signed)
It was good to see you today.  I am glad you are feeling so well.  I do not see anything concerning on your exam today.  I would like you to keep using the steroid as needed when you have symptoms.  Please do not use it more than 3 times a week. ? ?I will plan to see you in about 6 months.  If you have any symptoms before then, please call the clinic to see me sooner.  Otherwise, call in August or September to schedule a visit to see me in October. ?

## 2021-08-31 NOTE — Progress Notes (Signed)
Gynecologic Oncology Return Clinic Visit ? ?08/31/21 ? ?Reason for Visit: surveillance visit in the setting of endometrial cancer ? ?Treatment History: ?Oncology History Overview Note  ?The patient began noticing postmenopausal bleeding in June, 2017. She saw her primary care physician who determined there was no blood in the urine, and she was then referred to Dr Benjie Karvonen who, on 11/21/15 performed a TVUS which showed a uterus measuring 6x2.7x3.3cm with normal ovaries. There was a thickened endometrium of 6.47m.  ?  ?Endometrial cancer ([facility]  ?11/21/2015 Initial Diagnosis  ? Endometrial cancer ([facility] ?A pipelle endometrial biopsy was performed on 11/21/15 which showed FIGO grade 1-2 endometrial cancer.  The pap from the same date was normal. ?  ?12/30/2015 Surgery  ? Robotic assisted total hysterectomy, BSO, pelvic and PA SLN biopsy. ?Pathology revealed:  ?Preop Diagnosis: Grade 1-2 endometrioid adenocarcinoma.  ?Postoperative Diagnosis: same.  ?Surgery: Total robotic hysterectomy bilateral salpingo-oophorectomy, left pelvic, right para-aortic and bifurcation SLN removal ? ?Operative findings:  ?1) Colon adherent to posterior uterus, left sidewall and appendix ?2) 2 right para-aortic nodes for SLN (high and PA node) ?3) Aortic bifurcation nodes ?4) Left obturator node ? ?Diagnosis ?1. Lymph node, sentinel, biopsy, right obturator ?- ONE BENIGN LYMPH NODE (0/1). ?2. Lymph node, sentinel, biopsy, right peri-aortic ?- ONE BENIGN LYMPH NODE (0/1). ?3. Lymph node, sentinel, biopsy, right lower peri-aortic ?- ONE BENIGN LYMPH NODE (0/1). ?4. Lymph node, sentinel, biopsy, aortic bifurcation ?- ONE BENIGN LYMPH NODE (0/1). ?5. Lymph node, sentinel, biopsy, left obturator ?- ONE BENIGN LYMPH NODE (0/1). ?6. Uterus +/- tubes/ovaries, neoplastic ?- ENDOMETRIAL ADENOCARCINOMA, 1.7 CM WITH SUPERFICIAL MYOMETRIAL INVASION. ?- MARGINS NOT INVOLVED. ?- CERVIX, BILATERAL OVARIES AND BILATERAL FALLOPIAN TUBES FREE OF TUMOR. ?Microscopic  Comment ?6. ONCOLOGY TABLE-UTERUS, CARCINOMA OR CARCINOSARCOMA ?Specimen: Uterus with bilateral fallopian tubes and ovaries and sentinel lymph node biopsies. ?Procedure: Hysterectomy with sentinel lymph nodes. ?Lymph node sampling performed: Yes. ?Specimen integrity: Intact. ?Maximum tumor size: 1.7 cm ?Histologic type: Endometrioid with squamous differentiation. ?Grade: 2 ?Myometrial invasion: 0.3 cm where myometrium is 1 cm in thickness ?Cervical stromal involvement: No. ?Extent of involvement of other organs: None, identified. ?Lymph - vascular invasion: Not identified. ? ?  ?05/13/2016 Imaging  ? 05/03/16 CT guided drainage: ?IMPRESSION: ?Successful CT guided aspiration of approximately 15 cc of serous, slightly cloudy / milky fluid from the residual collection within the left hemipelvis. All aspirated samples were sent to the laboratory for cytologic and gram stain analysis. ? ?Cytology was negative ?  ?07/2017 Progression  ? EELIZABETHANNE LUSHERin February 2019 presented to her primary physician with complaints of rectal bleeding.  She was scheduled for a colonoscopy and had taken the bowel prep when she presented with obstruction of her distal sigmoid colon. ?  ?08/18/2017 Imaging  ? maging 08/18/2017 ?The left ureter is dilated to the left hemipelvis. It becomes ?narrowed in the inflammatory pelvic mass. This is described below. ?Bladder is within normal limits. ?  ?Stomach/Bowel: Large hiatal hernia is unchanged. Stable prominent ?gastric folds within the hiatal hernia. ?  ?The colon is diffusely distended. Distal small bowel is distended. A transition point between dilated large bowel and decompressed large bowel occurs in the sigmoid colon. There is a heterogeneous ill-defined mass involving the sigmoid colon and left side of the pelvis which includes the colon wall, adjacent fat, and both fluid and gas elements. Findings are suspicious for a focal perforation with abscess formation. Underlying malignancy is a  strong consideration given the appearance and colon obstruction.  The mass also involves the left ureter causing an element of left ureteral ?dilatation worrisome for an element of left ureteral obstruction. The gas and fluid collection is very small measuring 1.8 x 1.2 cm on  image 109 of series 4. Overall mass size including the involved colon is 5.7 x 5.3 cm. ? ?  ?08/21/2017 Surgery  ? [name] ?underwent a diverting laparoscopic transverse loop colostomy and subsequent placement of a left ureteral stent. ?  ?08/2017 -  Radiation Therapy  ? She then received pelvic external beam radiation therapy  ?  ?10/13/2017 - 11/17/2017 Chemotherapy  ? The patient had dexamethasone (DECADRON) 4 MG tablet, 8 mg, Oral, Daily, 1 of 1 cycle, Start date: --, End date: -- ?palonosetron (ALOXI) injection 0.25 mg, 0.25 mg, Intravenous,  Once, 6 of 6 cycles ?Administration: 0.25 mg (10/13/2017), 0.25 mg (10/20/2017), 0.25 mg (11/03/2017), 0.25 mg (11/10/2017), 0.25 mg (11/17/2017) ?CARBOplatin (PARAPLATIN) 150 mg in sodium chloride 0.9 % 100 mL chemo infusion, 150 mg (100 % of original dose 148 mg), Intravenous,  Once, 6 of 6 cycles ?Dose modification:   (original dose 148 mg, Cycle 1) ?Administration: 150 mg (10/13/2017), 150 mg (10/20/2017), 150 mg (11/03/2017), 150 mg (11/10/2017), 150 mg (11/17/2017) ?PACLitaxel (TAXOL) 132 mg in sodium chloride 0.9 % 250 mL chemo infusion (</= 25m/m2), 80 mg/m2 = 132 mg, Intravenous,  Once, 6 of 6 cycles ?Dose modification: 45 mg/m2 (original dose 80 mg/m2, Cycle 2, Reason: Other (see comments), Comment: Decreasing dose to 45 mg/m2 while undergoing XRT) ?Administration: 132 mg (10/13/2017), 72 mg (10/20/2017), 72 mg (11/03/2017), 72 mg (11/10/2017), 72 mg (11/17/2017) ? ? for chemotherapy treatment.  ? ?  ?01/18/2018 Imaging  ? notable for the presence of an IVC filter and 11 mm left internal iliac node that was previously 1.6 cm and a soft tissue mass in the left pelvic sidewall and now measuring 3.9 x 2.5  cm. ?  ?03/03/2018 Imaging  ? PET  demonstrates a hypermetabolic left pelvic sidewall mass, greater than 3 weeks after completion of radiotherapy. ?  ?03/16/2018 - 09/08/2020 Chemotherapy  ? Patient is on Treatment Plan : COLORECTAL Pembrolizumab q21d  ?   ?04/2018 Miscellaneous  ?  ?CA 125 ? Ref. Range 05/04/2018 12:08 05/25/2018 11:21 06/15/2018 13:30 10/10/2018 12:20 01/23/2019 11:35  ?Cancer Antigen (CA) 125 Latest Ref Range: 0.0 - 38.1 U/mL 11.2 10.9 10.2 10.9 9.2  ?  ?10/05/2018 Imaging  ? PET  ?3. Continued positive response to therapy within the left pelvic ?sidewall mass, which demonstrates residual low level hypermetabolism ?that has mildly decreased. ?4. Resolved mediastinal nodal hypermetabolism. Nearly resolved ?bilateral hilar nodal hypermetabolism. These findings could ?represent resolving reactive nodes versus response to therapy within ?metastatic nodes. ?5. No metabolic evidence of new sites of metastatic disease. ?  ?09/27/2019 Imaging  ? CT C/A/P: ?IMPRESSION: ?1. Since the PET of 10/05/2018, similar to slight decrease in left ?pelvic sidewall soft tissue fullness, without well-defined mass. ?2. No findings of  new or progressive disease. ?3. Chronic or recurrent left renal pelvic and proximal ureteric wall ?thickening/mucosal hyperenhancement. Nonspecific, especially in the ?setting of a prior stent. Cannot exclude ascending infection. ?Similarly, improved bladder wall and surrounding edema which could ?simply be treatment related. Correlate with urinalysis to exclude ?cystitis. ?4. Small hiatal hernia. ?5. Transverse colostomy with similar small bowel containing ?parastomal hernia. ?6.  Aortic Atherosclerosis (ICD10-I70.0). ?  ?03/17/2020 Imaging  ? CT C/A/P: ?1. No evidence of cervical cancer local recurrence or metastatic ?disease. ?2. LEFT midline  colostomy with peristomal hernia. Long segment small ?bowel enters the hernia sac without obstruction. ?3. Chronic fractures sacral insufficiency  fractures ?  ?06/03/2020 Imaging  ? CT C/A/P: ?1. Status post hysterectomy and bilateral oophorectomy. No findings ?for recurrent tumor, regional lymphadenopathy or metastatic disease. ?2. Stable loop colostomy with si

## 2021-09-04 DIAGNOSIS — K56609 Unspecified intestinal obstruction, unspecified as to partial versus complete obstruction: Secondary | ICD-10-CM | POA: Diagnosis not present

## 2021-09-04 DIAGNOSIS — Z933 Colostomy status: Secondary | ICD-10-CM | POA: Diagnosis not present

## 2021-09-07 ENCOUNTER — Other Ambulatory Visit: Payer: Self-pay

## 2021-09-07 ENCOUNTER — Ambulatory Visit: Payer: Medicare Other

## 2021-09-07 ENCOUNTER — Inpatient Hospital Stay: Payer: Medicare Other

## 2021-09-07 ENCOUNTER — Inpatient Hospital Stay (HOSPITAL_BASED_OUTPATIENT_CLINIC_OR_DEPARTMENT_OTHER): Payer: Medicare Other | Admitting: Hematology & Oncology

## 2021-09-07 ENCOUNTER — Encounter: Payer: Self-pay | Admitting: Hematology & Oncology

## 2021-09-07 ENCOUNTER — Other Ambulatory Visit: Payer: Medicare Other

## 2021-09-07 ENCOUNTER — Ambulatory Visit: Payer: Medicare Other | Admitting: Hematology & Oncology

## 2021-09-07 VITALS — BP 164/51 | HR 67 | Temp 98.7°F | Resp 18 | Wt 149.0 lb

## 2021-09-07 DIAGNOSIS — Z7983 Long term (current) use of bisphosphonates: Secondary | ICD-10-CM | POA: Diagnosis not present

## 2021-09-07 DIAGNOSIS — D509 Iron deficiency anemia, unspecified: Secondary | ICD-10-CM | POA: Diagnosis not present

## 2021-09-07 DIAGNOSIS — I890 Lymphedema, not elsewhere classified: Secondary | ICD-10-CM | POA: Diagnosis not present

## 2021-09-07 DIAGNOSIS — Z923 Personal history of irradiation: Secondary | ICD-10-CM | POA: Diagnosis not present

## 2021-09-07 DIAGNOSIS — D508 Other iron deficiency anemias: Secondary | ICD-10-CM

## 2021-09-07 DIAGNOSIS — D62 Acute posthemorrhagic anemia: Secondary | ICD-10-CM | POA: Diagnosis not present

## 2021-09-07 DIAGNOSIS — C539 Malignant neoplasm of cervix uteri, unspecified: Secondary | ICD-10-CM | POA: Diagnosis not present

## 2021-09-07 DIAGNOSIS — C799 Secondary malignant neoplasm of unspecified site: Secondary | ICD-10-CM

## 2021-09-07 DIAGNOSIS — C7951 Secondary malignant neoplasm of bone: Secondary | ICD-10-CM | POA: Diagnosis not present

## 2021-09-07 DIAGNOSIS — K435 Parastomal hernia without obstruction or  gangrene: Secondary | ICD-10-CM | POA: Diagnosis not present

## 2021-09-07 DIAGNOSIS — C541 Malignant neoplasm of endometrium: Secondary | ICD-10-CM | POA: Diagnosis not present

## 2021-09-07 DIAGNOSIS — Z9221 Personal history of antineoplastic chemotherapy: Secondary | ICD-10-CM | POA: Diagnosis not present

## 2021-09-07 LAB — CBC WITH DIFFERENTIAL (CANCER CENTER ONLY)
Abs Immature Granulocytes: 0.01 10*3/uL (ref 0.00–0.07)
Basophils Absolute: 0 10*3/uL (ref 0.0–0.1)
Basophils Relative: 1 %
Eosinophils Absolute: 0 10*3/uL (ref 0.0–0.5)
Eosinophils Relative: 1 %
HCT: 32.7 % — ABNORMAL LOW (ref 36.0–46.0)
Hemoglobin: 10.6 g/dL — ABNORMAL LOW (ref 12.0–15.0)
Immature Granulocytes: 0 %
Lymphocytes Relative: 16 %
Lymphs Abs: 0.8 10*3/uL (ref 0.7–4.0)
MCH: 29.5 pg (ref 26.0–34.0)
MCHC: 32.4 g/dL (ref 30.0–36.0)
MCV: 91.1 fL (ref 80.0–100.0)
Monocytes Absolute: 0.3 10*3/uL (ref 0.1–1.0)
Monocytes Relative: 7 %
Neutro Abs: 3.6 10*3/uL (ref 1.7–7.7)
Neutrophils Relative %: 75 %
Platelet Count: 269 10*3/uL (ref 150–400)
RBC: 3.59 MIL/uL — ABNORMAL LOW (ref 3.87–5.11)
RDW: 13.5 % (ref 11.5–15.5)
WBC Count: 4.8 10*3/uL (ref 4.0–10.5)
nRBC: 0 % (ref 0.0–0.2)

## 2021-09-07 LAB — CMP (CANCER CENTER ONLY)
ALT: 8 U/L (ref 0–44)
AST: 14 U/L — ABNORMAL LOW (ref 15–41)
Albumin: 3.9 g/dL (ref 3.5–5.0)
Alkaline Phosphatase: 67 U/L (ref 38–126)
Anion gap: 7 (ref 5–15)
BUN: 15 mg/dL (ref 8–23)
CO2: 28 mmol/L (ref 22–32)
Calcium: 9.4 mg/dL (ref 8.9–10.3)
Chloride: 105 mmol/L (ref 98–111)
Creatinine: 0.8 mg/dL (ref 0.44–1.00)
GFR, Estimated: >60 mL/min (ref >60)
Glucose, Bld: 88 mg/dL (ref 70–99)
Potassium: 3.9 mmol/L (ref 3.5–5.1)
Sodium: 140 mmol/L (ref 135–145)
Total Bilirubin: 0.3 mg/dL (ref 0.3–1.2)
Total Protein: 6.6 g/dL (ref 6.5–8.1)

## 2021-09-07 LAB — RETICULOCYTES
Immature Retic Fract: 4.9 % (ref 2.3–15.9)
RBC.: 3.59 MIL/uL — ABNORMAL LOW (ref 3.87–5.11)
Retic Count, Absolute: 34.8 10*3/uL (ref 19.0–186.0)
Retic Ct Pct: 1 % (ref 0.4–3.1)

## 2021-09-07 LAB — IRON AND IRON BINDING CAPACITY (CC-WL,HP ONLY)
Iron: 33 ug/dL (ref 28–170)
Saturation Ratios: 15 % (ref 10.4–31.8)
TIBC: 220 ug/dL — ABNORMAL LOW (ref 250–450)
UIBC: 187 ug/dL (ref 148–442)

## 2021-09-07 MED ORDER — EPINEPHRINE 0.3 MG/0.3ML IJ SOAJ: 0.3000 mg | Freq: Once | INTRAMUSCULAR | Status: DC | PRN

## 2021-09-07 MED ORDER — FAMOTIDINE IN NACL 20-0.9 MG/50ML-% IV SOLN: 20.0000 mg | Freq: Once | INTRAVENOUS | Status: DC | PRN

## 2021-09-07 MED ORDER — DIPHENHYDRAMINE HCL 50 MG/ML IJ SOLN: 50.0000 mg | Freq: Once | INTRAMUSCULAR | Status: DC | PRN

## 2021-09-07 MED ORDER — HEPARIN SOD (PORK) LOCK FLUSH 100 UNIT/ML IV SOLN
500.0000 [IU] | Freq: Once | INTRAVENOUS | Status: AC | PRN
  Administered 2021-09-07: 500 [IU]

## 2021-09-07 MED ORDER — ZOLEDRONIC ACID 4 MG/100ML IV SOLN
4.0000 mg | Freq: Once | INTRAVENOUS | Status: AC
  Administered 2021-09-07: 4 mg via INTRAVENOUS
  Filled 2021-09-07: qty 100

## 2021-09-07 MED ORDER — METHYLPREDNISOLONE SODIUM SUCC 125 MG IJ SOLR: 125.0000 mg | Freq: Once | INTRAMUSCULAR | Status: DC | PRN

## 2021-09-07 MED ORDER — HEPARIN SOD (PORK) LOCK FLUSH 100 UNIT/ML IV SOLN: 250.0000 [IU] | Freq: Once | INTRAVENOUS | Status: DC | PRN

## 2021-09-07 MED ORDER — ALTEPLASE 2 MG IJ SOLR: 2.0000 mg | Freq: Once | INTRAMUSCULAR | Status: DC | PRN

## 2021-09-07 MED ORDER — SODIUM CHLORIDE 0.9 % IV SOLN: 200.0000 mg | Freq: Once | INTRAVENOUS | Status: DC

## 2021-09-07 MED ORDER — SODIUM CHLORIDE 0.9% FLUSH
10.0000 mL | Freq: Once | INTRAVENOUS | Status: AC | PRN
  Administered 2021-09-07: 10 mL

## 2021-09-07 MED ORDER — SODIUM CHLORIDE 0.9 % IV SOLN: Freq: Once | INTRAVENOUS | Status: DC | PRN

## 2021-09-07 MED ORDER — SODIUM CHLORIDE 0.9% FLUSH: 3.0000 mL | Freq: Once | INTRAVENOUS | Status: DC | PRN

## 2021-09-07 MED ORDER — ALBUTEROL SULFATE (2.5 MG/3ML) 0.083% IN NEBU: 2.5000 mg | INHALATION_SOLUTION | Freq: Once | RESPIRATORY_TRACT | Status: DC | PRN

## 2021-09-07 NOTE — Progress Notes (Signed)
?Hematology and Oncology Follow Up Visit ? ?[name] ?[id number] ?1940-09-25 81 y.o. ?09/07/2021 ? ? ?Principle Diagnosis:  ?Locally advanced/recurrent endometrial carcinoma undifferentiated --  TMB (HIGH) / MSI HIGH  ?Intermittent iron deficiency anemia ?  ?Past Therapy: ?Radiation therapy for 5 -5 1/2 weeks - s/p 20 fractions ?Taxol/carboplatinum q 7 days -- s/p cycle 6  ?  ?Current Therapy:        ?Pembrolizumab 400 mg q 12 weeks, s/p cycle 20 (originally started on 01/23/2019 at 400 mg IV q 6 wk)  --on hold for patient request since April 2022 ?Zometa 4 mg IV q 3 months -- next dose on 11/2021 ?IV iron as indicated --Venofer given on 06/04/2021  ?  ?Interim History:  Ms. Muro is here today for follow-up.  So far, she been doing pretty well.  Her main problem right now has been this mild anemia.  We checked her iron studies.  We did go ahead and give her some IV iron back in January.  At that time, her ferritin was 14 with an iron saturation of 17%. ? ?I have to wonder if she has a low erythropoietin level.  We will have to monitor all of this. ? ?As far as her cancer, I do not think that is much of a problem.  Her last CT scan I think was done back in January and again, everything looked fine without any evidence of recurrence. ? ?She has had some chronic lymphedema in the left leg. ? ?She has had no problems with fever.  She has had no change in bowel or bladder habits.  She does have a colostomy. ? ?She has had no cough or shortness of breath.  She has had no fever. ? ?Overall, her performance status is ECOG 1.    ? ? ?Medications:  ?Allergies as of 09/07/2021   ? ?   Reactions  ? Statins Other (See Comments)  ? Myalgias and memory problems  ? Ciprofloxacin Itching, Other (See Comments)  ? Splotchy redness with itching during IV infusion localized to arm.  ? ?  ? ?  ?Medication List  ?  ? ?  ? Accurate as of September 07, 2021 12:47 PM. If you have any questions, ask your nurse or doctor.  ?  ?  ? ?   ? ?apixaban 5 MG Tabs tablet ?Commonly known as: ELIQUIS ?Take 1 tablet (5 mg total) by mouth 2 (two) times daily. ?  ?clobetasol ointment 0.05 % ?Commonly known as: TEMOVATE ?Apply 1 application topically 3 (three) times a week. ?  ?diltiazem 300 MG 24 hr capsule ?Commonly known as: TIAZAC ?TAKE 1 CAPSULE(300 MG) BY MOUTH DAILY ?What changed: See the new instructions. ?  ?ezetimibe 10 MG tablet ?Commonly known as: ZETIA ?Take 10 mg by mouth daily. ?  ?Fluad Quadrivalent 0.5 ML injection ?Generic drug: influenza vaccine adjuvanted ?Inject into the muscle. ?  ?levothyroxine 50 MCG tablet ?Commonly known as: SYNTHROID ?Take 1 tablet (50 mcg total) by mouth daily before breakfast. ?  ?ondansetron 8 MG disintegrating tablet ?Commonly known as: ZOFRAN-ODT ?Take 8 mg by mouth as needed. ?  ?polyethylene glycol 17 g packet ?Commonly known as: MIRALAX / GLYCOLAX ?Take 17 g by mouth daily as needed for severe constipation. ?  ?Repatha SureClick 419 MG/ML Soaj ?Generic drug: Evolocumab ?INJECT 1 DOSE(140MG) UNDER THE SKIN EVERY 14 DAYS ?  ?traMADol 50 MG tablet ?Commonly known as: ULTRAM ?TAKE 2 TABLETS(100 MG) BY MOUTH TWICE DAILY ?  ? ?  ? ? ?  Allergies:  ?Allergies  ?Allergen Reactions  ? Statins Other (See Comments)  ?  Myalgias and memory problems  ? Ciprofloxacin Itching and Other (See Comments)  ?  Splotchy redness with itching during IV infusion localized to arm.  ? ? ?Past Medical History, Surgical history, Social history, and Family History were reviewed and updated. ? ?Review of Systems: ?Review of Systems  ?Constitutional: Negative.   ?HENT: Negative.    ?Eyes: Negative.   ?Respiratory: Negative.    ?Cardiovascular:  Positive for leg swelling.  ?Gastrointestinal: Negative.   ?Genitourinary: Negative.   ?Musculoskeletal:  Positive for back pain.  ?Skin: Negative.   ?Neurological: Negative.   ?Endo/Heme/Allergies: Negative.   ?Psychiatric/Behavioral: Negative.    ? ? ?Physical Exam: ? weight is 149 lb (67.6 kg). Her  oral temperature is 98.7 ?F (37.1 ?C). Her blood pressure is 164/51 (abnormal) and her pulse is 67. Her respiration is 18 and oxygen saturation is 100%.  ? ?Wt Readings from Last 3 Encounters:  ?09/07/21 149 lb (67.6 kg)  ?08/31/21 147 lb 12.8 oz (67 kg)  ?06/04/21 146 lb (66.2 kg)  ? ? ?Physical Exam ?Vitals reviewed.  ?HENT:  ?   Head: Normocephalic and atraumatic.  ?Eyes:  ?   Pupils: Pupils are equal, round, and reactive to light.  ?Cardiovascular:  ?   Rate and Rhythm: Normal rate and regular rhythm.  ?   Heart sounds: Normal heart sounds.  ?   Comments: Cardiac exam shows a regular rate and rhythm.  She has a 1/6 systolic ejection murmur.   ?Pulmonary:  ?   Effort: Pulmonary effort is normal.  ?   Breath sounds: Normal breath sounds.  ?Abdominal:  ?   General: Bowel sounds are normal.  ?   Palpations: Abdomen is soft.  ?   Comments: Abdominal exam shows a soft abdomen.  She has a colostomy that is intact.  She has no fluid wave.  There is no obvious abdominal mass.  There is no palpable liver or spleen tip.  ?Musculoskeletal:     ?   General: No tenderness or deformity. Normal range of motion.  ?   Cervical back: Normal range of motion.  ?   Comments: She has significant lymphedema in her legs.  She has probably 3+ in the left leg and 2+ in the right leg.  I do not see any erythema.  She has pitting edema.  She has decent range of motion of her joints.  ?Lymphadenopathy:  ?   Cervical: No cervical adenopathy.  ?Skin: ?   General: Skin is warm and dry.  ?   Findings: No erythema or rash.  ?Neurological:  ?   Mental Status: She is alert and oriented to person, place, and time.  ?Psychiatric:     ?   Behavior: Behavior normal.     ?   Thought Content: Thought content normal.     ?   Judgment: Judgment normal.  ? ? ? ?Lab Results  ?Component Value Date  ? WBC 4.8 09/07/2021  ? HGB 10.6 (L) 09/07/2021  ? HCT 32.7 (L) 09/07/2021  ? MCV 91.1 09/07/2021  ? PLT 269 09/07/2021  ? ?Lab Results  ?Component Value Date  ?  FERRITIN 418 (H) 06/04/2021  ? IRON 34 06/04/2021  ? TIBC 206 (L) 06/04/2021  ? UIBC 172 06/04/2021  ? IRONPCTSAT 17 06/04/2021  ? ?Lab Results  ?Component Value Date  ? RETICCTPCT 1.0 09/07/2021  ? RBC 3.59 (L) 09/07/2021  ?  RBC 3.59 (L) 09/07/2021  ? ?No results found for: KPAFRELGTCHN, LAMBDASER, KAPLAMBRATIO ?No results found for: IGGSERUM, IGA, IGMSERUM ?No results found for: TOTALPROTELP, ALBUMINELP, A1GS, A2GS, BETS, BETA2SER, GAMS, MSPIKE, SPEI ?  Chemistry   ?   ?Component Value Date/Time  ? NA 139 06/04/2021 1144  ? NA 136 (A) 09/08/2017 0000  ? K 3.9 06/04/2021 1144  ? CL 103 06/04/2021 1144  ? CO2 29 06/04/2021 1144  ? BUN 11 06/04/2021 1144  ? BUN 8 09/08/2017 0000  ? CREATININE 0.85 06/04/2021 1144  ? CREATININE 0.69 08/02/2016 1519  ? GLU 111 09/08/2017 0000  ?    ?Component Value Date/Time  ? CALCIUM 9.9 06/04/2021 1144  ? ALKPHOS 74 06/04/2021 1144  ? AST 14 (L) 06/04/2021 1144  ? ALT 8 06/04/2021 1144  ? BILITOT 0.3 06/04/2021 1144  ?  ? ? ? ?Impression and Plan: Ms. Bitton is a very pleasant 81 yo caucasian female with recurrent undifferentiated endometrial carcinoma.  ? ?She was on immunotherapy.  She has a high MSI.  She really has done well with immunotherapy.  The last CT scan confirms this. ? ?Again, we will have to monitor her anemia.  Of note, she has had a past CVA for which she tells me.  She is on Eliquis for this.  I have no idea and she has no idea who is monitoring all of this.  She does not see a neurologist for what she tells me. ? ?We will have to get her back in about a month or so just for the anemia issue.  Again, I suspect that she is going to have a low erythropoietin level. ? ?Volanda Napoleon, MD ?4/10/202312:47 PM ? ?

## 2021-09-07 NOTE — Patient Instructions (Signed)

## 2021-09-07 NOTE — Progress Notes (Signed)
No Iron today.  Awaiting iron study results.  MMorris, RN ?

## 2021-09-08 ENCOUNTER — Telehealth: Payer: Self-pay | Admitting: Hematology & Oncology

## 2021-09-08 LAB — FERRITIN: Ferritin: 364 ng/mL — ABNORMAL HIGH (ref 11–307)

## 2021-09-08 NOTE — Telephone Encounter (Signed)
Called to schedule dose of iron per Dr. Marin Olp , left voicemail for patient to call back to schedule ?

## 2021-09-09 ENCOUNTER — Telehealth: Payer: Self-pay | Admitting: Hematology & Oncology

## 2021-09-09 NOTE — Telephone Encounter (Signed)
Called 2x to schedule dose of iron per Dr. Marin Olp , left voicemail for patient to call us back to schedule  ?

## 2021-09-09 NOTE — Telephone Encounter (Signed)
Pt called back and is requesting to be scheduled at [facility] if possible for 1 dose of iron since its closer to her . Schedule message sent to [facility] message pool  ?

## 2021-09-10 ENCOUNTER — Telehealth: Payer: Self-pay | Admitting: Hematology & Oncology

## 2021-09-10 NOTE — Telephone Encounter (Signed)
Per 4/12 in basket Called pt and confirmed appointment.   ?

## 2021-09-11 ENCOUNTER — Inpatient Hospital Stay: Payer: Medicare Other

## 2021-09-11 ENCOUNTER — Other Ambulatory Visit: Payer: Self-pay

## 2021-09-11 VITALS — BP 152/58 | HR 64 | Temp 98.5°F | Resp 16

## 2021-09-11 DIAGNOSIS — C7951 Secondary malignant neoplasm of bone: Secondary | ICD-10-CM | POA: Diagnosis not present

## 2021-09-11 DIAGNOSIS — Z933 Colostomy status: Secondary | ICD-10-CM | POA: Diagnosis not present

## 2021-09-11 DIAGNOSIS — C541 Malignant neoplasm of endometrium: Secondary | ICD-10-CM | POA: Diagnosis not present

## 2021-09-11 DIAGNOSIS — K56609 Unspecified intestinal obstruction, unspecified as to partial versus complete obstruction: Secondary | ICD-10-CM | POA: Diagnosis not present

## 2021-09-11 DIAGNOSIS — I890 Lymphedema, not elsewhere classified: Secondary | ICD-10-CM | POA: Diagnosis not present

## 2021-09-11 DIAGNOSIS — D509 Iron deficiency anemia, unspecified: Secondary | ICD-10-CM | POA: Diagnosis not present

## 2021-09-11 DIAGNOSIS — Z9221 Personal history of antineoplastic chemotherapy: Secondary | ICD-10-CM | POA: Diagnosis not present

## 2021-09-11 DIAGNOSIS — K435 Parastomal hernia without obstruction or  gangrene: Secondary | ICD-10-CM | POA: Diagnosis not present

## 2021-09-11 DIAGNOSIS — Z7983 Long term (current) use of bisphosphonates: Secondary | ICD-10-CM | POA: Diagnosis not present

## 2021-09-11 DIAGNOSIS — Z923 Personal history of irradiation: Secondary | ICD-10-CM | POA: Diagnosis not present

## 2021-09-11 DIAGNOSIS — D508 Other iron deficiency anemias: Secondary | ICD-10-CM

## 2021-09-11 MED ORDER — SODIUM CHLORIDE 0.9 % IV SOLN
200.0000 mg | Freq: Once | INTRAVENOUS | Status: AC
  Administered 2021-09-11: 200 mg via INTRAVENOUS
  Filled 2021-09-11: qty 200

## 2021-09-11 NOTE — Patient Instructions (Signed)

## 2021-09-11 NOTE — Progress Notes (Signed)
Patient tolerated IV iron infusion well. Monitored for 15 minute post-observation period, per patient request. VSS, ambulatory to lobby.  ?

## 2021-10-02 DIAGNOSIS — Z933 Colostomy status: Secondary | ICD-10-CM | POA: Diagnosis not present

## 2021-10-02 DIAGNOSIS — K56609 Unspecified intestinal obstruction, unspecified as to partial versus complete obstruction: Secondary | ICD-10-CM | POA: Diagnosis not present

## 2021-10-05 ENCOUNTER — Telehealth: Payer: Self-pay | Admitting: *Deleted

## 2021-10-05 NOTE — Telephone Encounter (Signed)
Spoke with pt this afternoon who stated that she is having some redness and irration on her vulva and need a prescription refill on her mometasone cream. She denies pain, fever, complications with bladder or bowel. Mometasone refill sent in. [name], NP notified.  ?

## 2021-10-14 DIAGNOSIS — Z933 Colostomy status: Secondary | ICD-10-CM | POA: Diagnosis not present

## 2021-10-14 DIAGNOSIS — K56609 Unspecified intestinal obstruction, unspecified as to partial versus complete obstruction: Secondary | ICD-10-CM | POA: Diagnosis not present

## 2021-10-15 ENCOUNTER — Other Ambulatory Visit: Payer: Self-pay | Admitting: Hematology & Oncology

## 2021-10-15 DIAGNOSIS — C541 Malignant neoplasm of endometrium: Secondary | ICD-10-CM

## 2021-10-15 DIAGNOSIS — M545 Low back pain, unspecified: Secondary | ICD-10-CM

## 2021-10-15 DIAGNOSIS — G8929 Other chronic pain: Secondary | ICD-10-CM

## 2021-10-15 DIAGNOSIS — M792 Neuralgia and neuritis, unspecified: Secondary | ICD-10-CM

## 2021-10-16 ENCOUNTER — Inpatient Hospital Stay: Payer: Medicare Other

## 2021-10-16 ENCOUNTER — Ambulatory Visit: Payer: Medicare Other | Admitting: Hematology & Oncology

## 2021-10-23 ENCOUNTER — Other Ambulatory Visit: Payer: Medicare Other

## 2021-10-23 ENCOUNTER — Ambulatory Visit: Payer: Medicare Other | Admitting: Hematology & Oncology

## 2021-11-12 DIAGNOSIS — K56609 Unspecified intestinal obstruction, unspecified as to partial versus complete obstruction: Secondary | ICD-10-CM | POA: Diagnosis not present

## 2021-11-12 DIAGNOSIS — Z933 Colostomy status: Secondary | ICD-10-CM | POA: Diagnosis not present

## 2021-11-19 DIAGNOSIS — Z933 Colostomy status: Secondary | ICD-10-CM | POA: Diagnosis not present

## 2021-11-19 DIAGNOSIS — K56609 Unspecified intestinal obstruction, unspecified as to partial versus complete obstruction: Secondary | ICD-10-CM | POA: Diagnosis not present

## 2021-11-20 ENCOUNTER — Encounter: Payer: Self-pay | Admitting: Hematology & Oncology

## 2021-11-20 ENCOUNTER — Inpatient Hospital Stay: Payer: Medicare Other | Attending: Hematology & Oncology

## 2021-11-20 ENCOUNTER — Other Ambulatory Visit: Payer: Self-pay

## 2021-11-20 ENCOUNTER — Inpatient Hospital Stay: Payer: Medicare Other

## 2021-11-20 ENCOUNTER — Inpatient Hospital Stay (HOSPITAL_BASED_OUTPATIENT_CLINIC_OR_DEPARTMENT_OTHER): Payer: Medicare Other | Admitting: Hematology & Oncology

## 2021-11-20 VITALS — BP 163/44 | HR 71 | Temp 98.4°F | Resp 18 | Ht 61.0 in | Wt 147.8 lb

## 2021-11-20 DIAGNOSIS — E038 Other specified hypothyroidism: Secondary | ICD-10-CM

## 2021-11-20 DIAGNOSIS — C799 Secondary malignant neoplasm of unspecified site: Secondary | ICD-10-CM

## 2021-11-20 DIAGNOSIS — I890 Lymphedema, not elsewhere classified: Secondary | ICD-10-CM | POA: Insufficient documentation

## 2021-11-20 DIAGNOSIS — C541 Malignant neoplasm of endometrium: Secondary | ICD-10-CM | POA: Insufficient documentation

## 2021-11-20 DIAGNOSIS — D62 Acute posthemorrhagic anemia: Secondary | ICD-10-CM

## 2021-11-20 DIAGNOSIS — C539 Malignant neoplasm of cervix uteri, unspecified: Secondary | ICD-10-CM

## 2021-11-20 DIAGNOSIS — Z95828 Presence of other vascular implants and grafts: Secondary | ICD-10-CM

## 2021-11-20 DIAGNOSIS — D509 Iron deficiency anemia, unspecified: Secondary | ICD-10-CM | POA: Insufficient documentation

## 2021-11-20 LAB — CMP (CANCER CENTER ONLY)
ALT: 7 U/L (ref 0–44)
AST: 13 U/L — ABNORMAL LOW (ref 15–41)
Albumin: 4 g/dL (ref 3.5–5.0)
Alkaline Phosphatase: 72 U/L (ref 38–126)
Anion gap: 8 (ref 5–15)
BUN: 15 mg/dL (ref 8–23)
CO2: 27 mmol/L (ref 22–32)
Calcium: 9.4 mg/dL (ref 8.9–10.3)
Chloride: 104 mmol/L (ref 98–111)
Creatinine: 0.89 mg/dL (ref 0.44–1.00)
GFR, Estimated: >60 mL/min (ref >60)
Glucose, Bld: 94 mg/dL (ref 70–99)
Potassium: 3.9 mmol/L (ref 3.5–5.1)
Sodium: 139 mmol/L (ref 135–145)
Total Bilirubin: 0.3 mg/dL (ref 0.3–1.2)
Total Protein: 7 g/dL (ref 6.5–8.1)

## 2021-11-20 LAB — CBC WITH DIFFERENTIAL (CANCER CENTER ONLY)
Abs Immature Granulocytes: 0.01 10*3/uL (ref 0.00–0.07)
Basophils Absolute: 0 10*3/uL (ref 0.0–0.1)
Basophils Relative: 0 %
Eosinophils Absolute: 0 10*3/uL (ref 0.0–0.5)
Eosinophils Relative: 1 %
HCT: 34.5 % — ABNORMAL LOW (ref 36.0–46.0)
Hemoglobin: 11.2 g/dL — ABNORMAL LOW (ref 12.0–15.0)
Immature Granulocytes: 0 %
Lymphocytes Relative: 18 %
Lymphs Abs: 0.9 10*3/uL (ref 0.7–4.0)
MCH: 29.2 pg (ref 26.0–34.0)
MCHC: 32.5 g/dL (ref 30.0–36.0)
MCV: 90.1 fL (ref 80.0–100.0)
Monocytes Absolute: 0.3 10*3/uL (ref 0.1–1.0)
Monocytes Relative: 7 %
Neutro Abs: 3.8 10*3/uL (ref 1.7–7.7)
Neutrophils Relative %: 74 %
Platelet Count: 253 10*3/uL (ref 150–400)
RBC: 3.83 MIL/uL — ABNORMAL LOW (ref 3.87–5.11)
RDW: 13.9 % (ref 11.5–15.5)
WBC Count: 5.1 10*3/uL (ref 4.0–10.5)
nRBC: 0 % (ref 0.0–0.2)

## 2021-11-20 LAB — RETICULOCYTES
Immature Retic Fract: 4.3 % (ref 2.3–15.9)
RBC.: 3.87 MIL/uL (ref 3.87–5.11)
Retic Count, Absolute: 34.4 10*3/uL (ref 19.0–186.0)
Retic Ct Pct: 0.9 % (ref 0.4–3.1)

## 2021-11-20 LAB — FERRITIN: Ferritin: 251 ng/mL (ref 11–307)

## 2021-11-20 MED ORDER — SODIUM CHLORIDE 0.9% FLUSH
10.0000 mL | INTRAVENOUS | Status: DC | PRN
  Administered 2021-11-20: 10 mL via INTRAVENOUS

## 2021-11-20 MED ORDER — HEPARIN SOD (PORK) LOCK FLUSH 100 UNIT/ML IV SOLN
500.0000 [IU] | Freq: Once | INTRAVENOUS | Status: AC
  Administered 2021-11-20: 500 [IU] via INTRAVENOUS

## 2021-11-21 LAB — ERYTHROPOIETIN: Erythropoietin: 10.6 m[IU]/mL (ref 2.6–18.5)

## 2021-11-23 ENCOUNTER — Telehealth: Payer: Self-pay

## 2021-11-23 ENCOUNTER — Ambulatory Visit: Payer: Medicare Other | Admitting: Internal Medicine

## 2021-11-23 ENCOUNTER — Encounter: Payer: Self-pay | Admitting: Internal Medicine

## 2021-11-23 VITALS — BP 142/60 | HR 66 | Ht 61.0 in | Wt 148.0 lb

## 2021-11-23 DIAGNOSIS — I6523 Occlusion and stenosis of bilateral carotid arteries: Secondary | ICD-10-CM

## 2021-11-23 DIAGNOSIS — Z8673 Personal history of transient ischemic attack (TIA), and cerebral infarction without residual deficits: Secondary | ICD-10-CM

## 2021-11-23 DIAGNOSIS — E7801 Familial hypercholesterolemia: Secondary | ICD-10-CM | POA: Diagnosis not present

## 2021-11-23 DIAGNOSIS — T466X5D Adverse effect of antihyperlipidemic and antiarteriosclerotic drugs, subsequent encounter: Secondary | ICD-10-CM | POA: Diagnosis not present

## 2021-11-23 DIAGNOSIS — M791 Myalgia, unspecified site: Secondary | ICD-10-CM | POA: Diagnosis not present

## 2021-11-23 DIAGNOSIS — Z951 Presence of aortocoronary bypass graft: Secondary | ICD-10-CM

## 2021-11-23 DIAGNOSIS — I480 Paroxysmal atrial fibrillation: Secondary | ICD-10-CM

## 2021-11-23 LAB — IRON AND IRON BINDING CAPACITY (CC-WL,HP ONLY)
Iron: 20 ug/dL — ABNORMAL LOW (ref 28–170)
Saturation Ratios: 9 % — ABNORMAL LOW (ref 10.4–31.8)
TIBC: 216 ug/dL — ABNORMAL LOW (ref 250–450)
UIBC: 196 ug/dL (ref 148–442)

## 2021-11-24 ENCOUNTER — Telehealth: Payer: Self-pay | Admitting: Hematology & Oncology

## 2021-11-24 LAB — LIPID PANEL
Chol/HDL Ratio: 3.9 ratio (ref 0.0–4.4)
Cholesterol, Total: 224 mg/dL — ABNORMAL HIGH (ref 100–199)
HDL: 57 mg/dL (ref >39)
LDL Chol Calc (NIH): 151 mg/dL — ABNORMAL HIGH (ref 0–99)
Triglycerides: 91 mg/dL (ref 0–149)
VLDL Cholesterol Cal: 16 mg/dL (ref 5–40)

## 2021-12-02 ENCOUNTER — Inpatient Hospital Stay: Payer: Medicare Other | Attending: Hematology & Oncology

## 2021-12-02 VITALS — BP 156/55 | HR 65 | Temp 98.6°F | Resp 18

## 2021-12-02 DIAGNOSIS — D508 Other iron deficiency anemias: Secondary | ICD-10-CM

## 2021-12-02 DIAGNOSIS — Z79899 Other long term (current) drug therapy: Secondary | ICD-10-CM | POA: Insufficient documentation

## 2021-12-02 DIAGNOSIS — C541 Malignant neoplasm of endometrium: Secondary | ICD-10-CM | POA: Insufficient documentation

## 2021-12-02 MED ORDER — ZOLEDRONIC ACID 4 MG/100ML IV SOLN
4.0000 mg | Freq: Once | INTRAVENOUS | Status: AC
  Administered 2021-12-02: 4 mg via INTRAVENOUS
  Filled 2021-12-02: qty 100

## 2021-12-02 MED ORDER — ALTEPLASE 2 MG IJ SOLR: 2.0000 mg | Freq: Once | INTRAMUSCULAR | Status: DC | PRN

## 2021-12-02 MED ORDER — SODIUM CHLORIDE 0.9 % IV SOLN: Freq: Once | INTRAVENOUS | Status: DC | PRN

## 2021-12-02 MED ORDER — HEPARIN SOD (PORK) LOCK FLUSH 100 UNIT/ML IV SOLN
250.0000 [IU] | Freq: Once | INTRAVENOUS | Status: AC | PRN
  Administered 2021-12-02: 500 [IU]

## 2021-12-02 MED ORDER — SODIUM CHLORIDE 0.9 % IV SOLN
200.0000 mg | Freq: Once | INTRAVENOUS | Status: AC
  Administered 2021-12-02: 200 mg via INTRAVENOUS
  Filled 2021-12-02: qty 200

## 2021-12-02 MED ORDER — SODIUM CHLORIDE 0.9% FLUSH
3.0000 mL | Freq: Once | INTRAVENOUS | Status: AC | PRN
  Administered 2021-12-02: 10 mL

## 2021-12-02 NOTE — Patient Instructions (Signed)

## 2021-12-09 ENCOUNTER — Other Ambulatory Visit: Payer: Self-pay | Admitting: Hematology & Oncology

## 2021-12-09 DIAGNOSIS — M792 Neuralgia and neuritis, unspecified: Secondary | ICD-10-CM

## 2021-12-09 DIAGNOSIS — M545 Low back pain, unspecified: Secondary | ICD-10-CM

## 2021-12-09 DIAGNOSIS — C541 Malignant neoplasm of endometrium: Secondary | ICD-10-CM

## 2021-12-09 DIAGNOSIS — G8929 Other chronic pain: Secondary | ICD-10-CM

## 2021-12-16 DIAGNOSIS — I2510 Atherosclerotic heart disease of native coronary artery without angina pectoris: Secondary | ICD-10-CM | POA: Diagnosis not present

## 2021-12-16 DIAGNOSIS — G894 Chronic pain syndrome: Secondary | ICD-10-CM | POA: Diagnosis not present

## 2021-12-16 DIAGNOSIS — E039 Hypothyroidism, unspecified: Secondary | ICD-10-CM | POA: Diagnosis not present

## 2021-12-16 DIAGNOSIS — I10 Essential (primary) hypertension: Secondary | ICD-10-CM | POA: Diagnosis not present

## 2021-12-16 DIAGNOSIS — Z23 Encounter for immunization: Secondary | ICD-10-CM | POA: Diagnosis not present

## 2021-12-16 DIAGNOSIS — I890 Lymphedema, not elsewhere classified: Secondary | ICD-10-CM | POA: Diagnosis not present

## 2021-12-16 DIAGNOSIS — I872 Venous insufficiency (chronic) (peripheral): Secondary | ICD-10-CM | POA: Diagnosis not present

## 2021-12-16 DIAGNOSIS — Z0000 Encounter for general adult medical examination without abnormal findings: Secondary | ICD-10-CM | POA: Diagnosis not present

## 2021-12-16 DIAGNOSIS — I4891 Unspecified atrial fibrillation: Secondary | ICD-10-CM | POA: Diagnosis not present

## 2021-12-16 DIAGNOSIS — E785 Hyperlipidemia, unspecified: Secondary | ICD-10-CM | POA: Diagnosis not present

## 2021-12-16 DIAGNOSIS — I700 Atherosclerosis of aorta: Secondary | ICD-10-CM | POA: Diagnosis not present

## 2021-12-18 ENCOUNTER — Ambulatory Visit (HOSPITAL_COMMUNITY)
Admission: RE | Admit: 2021-12-18 | Discharge: 2021-12-18 | Disposition: A | Discharge status: Home / Self Care | Payer: Medicare Other | Source: Ambulatory Visit | Attending: Hematology & Oncology | Admitting: Hematology & Oncology

## 2021-12-18 DIAGNOSIS — C539 Malignant neoplasm of cervix uteri, unspecified: Secondary | ICD-10-CM | POA: Diagnosis present

## 2021-12-18 DIAGNOSIS — C799 Secondary malignant neoplasm of unspecified site: Secondary | ICD-10-CM | POA: Diagnosis not present

## 2021-12-18 DIAGNOSIS — K8020 Calculus of gallbladder without cholecystitis without obstruction: Secondary | ICD-10-CM | POA: Diagnosis not present

## 2021-12-18 DIAGNOSIS — K5730 Diverticulosis of large intestine without perforation or abscess without bleeding: Secondary | ICD-10-CM | POA: Diagnosis not present

## 2021-12-18 MED ORDER — IOHEXOL 300 MG/ML  SOLN
100.0000 mL | Freq: Once | INTRAMUSCULAR | Status: AC | PRN
  Administered 2021-12-18: 100 mL via INTRAVENOUS

## 2021-12-31 DIAGNOSIS — K56609 Unspecified intestinal obstruction, unspecified as to partial versus complete obstruction: Secondary | ICD-10-CM | POA: Diagnosis not present

## 2021-12-31 DIAGNOSIS — Z933 Colostomy status: Secondary | ICD-10-CM | POA: Diagnosis not present

## 2022-01-01 ENCOUNTER — Other Ambulatory Visit (HOSPITAL_BASED_OUTPATIENT_CLINIC_OR_DEPARTMENT_OTHER): Payer: Self-pay

## 2022-01-01 ENCOUNTER — Inpatient Hospital Stay (HOSPITAL_BASED_OUTPATIENT_CLINIC_OR_DEPARTMENT_OTHER): Payer: Medicare Other | Admitting: Hematology & Oncology

## 2022-01-01 ENCOUNTER — Inpatient Hospital Stay: Payer: Medicare Other

## 2022-01-01 ENCOUNTER — Ambulatory Visit: Payer: Medicare Other

## 2022-01-01 ENCOUNTER — Ambulatory Visit: Payer: Medicare Other | Attending: Internal Medicine

## 2022-01-01 ENCOUNTER — Other Ambulatory Visit: Payer: Self-pay

## 2022-01-01 ENCOUNTER — Inpatient Hospital Stay: Payer: Medicare Other | Attending: Hematology & Oncology

## 2022-01-01 ENCOUNTER — Encounter: Payer: Self-pay | Admitting: Hematology & Oncology

## 2022-01-01 VITALS — BP 143/41 | HR 63 | Resp 18

## 2022-01-01 VITALS — BP 147/45 | HR 60 | Temp 98.0°F | Resp 18 | Wt 148.0 lb

## 2022-01-01 DIAGNOSIS — Z79899 Other long term (current) drug therapy: Secondary | ICD-10-CM | POA: Diagnosis not present

## 2022-01-01 DIAGNOSIS — Z7951 Long term (current) use of inhaled steroids: Secondary | ICD-10-CM | POA: Insufficient documentation

## 2022-01-01 DIAGNOSIS — Z923 Personal history of irradiation: Secondary | ICD-10-CM | POA: Diagnosis not present

## 2022-01-01 DIAGNOSIS — C539 Malignant neoplasm of cervix uteri, unspecified: Secondary | ICD-10-CM

## 2022-01-01 DIAGNOSIS — D649 Anemia, unspecified: Secondary | ICD-10-CM | POA: Diagnosis not present

## 2022-01-01 DIAGNOSIS — C799 Secondary malignant neoplasm of unspecified site: Secondary | ICD-10-CM | POA: Diagnosis not present

## 2022-01-01 DIAGNOSIS — D509 Iron deficiency anemia, unspecified: Secondary | ICD-10-CM | POA: Insufficient documentation

## 2022-01-01 DIAGNOSIS — E038 Other specified hypothyroidism: Secondary | ICD-10-CM

## 2022-01-01 DIAGNOSIS — C541 Malignant neoplasm of endometrium: Secondary | ICD-10-CM | POA: Diagnosis present

## 2022-01-01 DIAGNOSIS — D508 Other iron deficiency anemias: Secondary | ICD-10-CM

## 2022-01-01 DIAGNOSIS — Z23 Encounter for immunization: Secondary | ICD-10-CM

## 2022-01-01 DIAGNOSIS — I890 Lymphedema, not elsewhere classified: Secondary | ICD-10-CM | POA: Insufficient documentation

## 2022-01-01 LAB — FERRITIN: Ferritin: 216 ng/mL (ref 11–307)

## 2022-01-01 LAB — CBC WITH DIFFERENTIAL (CANCER CENTER ONLY)
Abs Immature Granulocytes: 0.02 10*3/uL (ref 0.00–0.07)
Basophils Absolute: 0 10*3/uL (ref 0.0–0.1)
Basophils Relative: 1 %
Eosinophils Absolute: 0 10*3/uL (ref 0.0–0.5)
Eosinophils Relative: 1 %
HCT: 33.3 % — ABNORMAL LOW (ref 36.0–46.0)
Hemoglobin: 10.8 g/dL — ABNORMAL LOW (ref 12.0–15.0)
Immature Granulocytes: 0 %
Lymphocytes Relative: 12 %
Lymphs Abs: 0.6 10*3/uL — ABNORMAL LOW (ref 0.7–4.0)
MCH: 29.3 pg (ref 26.0–34.0)
MCHC: 32.4 g/dL (ref 30.0–36.0)
MCV: 90.2 fL (ref 80.0–100.0)
Monocytes Absolute: 0.3 10*3/uL (ref 0.1–1.0)
Monocytes Relative: 6 %
Neutro Abs: 3.9 10*3/uL (ref 1.7–7.7)
Neutrophils Relative %: 80 %
Platelet Count: 249 10*3/uL (ref 150–400)
RBC: 3.69 MIL/uL — ABNORMAL LOW (ref 3.87–5.11)
RDW: 14 % (ref 11.5–15.5)
WBC Count: 4.8 10*3/uL (ref 4.0–10.5)
nRBC: 0 % (ref 0.0–0.2)

## 2022-01-01 LAB — CMP (CANCER CENTER ONLY)
ALT: 8 U/L (ref 0–44)
AST: 13 U/L — ABNORMAL LOW (ref 15–41)
Albumin: 4 g/dL (ref 3.5–5.0)
Alkaline Phosphatase: 65 U/L (ref 38–126)
Anion gap: 7 (ref 5–15)
BUN: 17 mg/dL (ref 8–23)
CO2: 27 mmol/L (ref 22–32)
Calcium: 9.4 mg/dL (ref 8.9–10.3)
Chloride: 106 mmol/L (ref 98–111)
Creatinine: 0.84 mg/dL (ref 0.44–1.00)
GFR, Estimated: >60 mL/min (ref >60)
Glucose, Bld: 112 mg/dL — ABNORMAL HIGH (ref 70–99)
Potassium: 3.8 mmol/L (ref 3.5–5.1)
Sodium: 140 mmol/L (ref 135–145)
Total Bilirubin: 0.3 mg/dL (ref 0.3–1.2)
Total Protein: 6.5 g/dL (ref 6.5–8.1)

## 2022-01-01 LAB — IRON AND IRON BINDING CAPACITY (CC-WL,HP ONLY)
Iron: 42 ug/dL (ref 28–170)
Saturation Ratios: 20 % (ref 10.4–31.8)
TIBC: 206 ug/dL — ABNORMAL LOW (ref 250–450)
UIBC: 164 ug/dL (ref 148–442)

## 2022-01-01 LAB — RETICULOCYTES
Immature Retic Fract: 5 % (ref 2.3–15.9)
RBC.: 3.62 MIL/uL — ABNORMAL LOW (ref 3.87–5.11)
Retic Count, Absolute: 35.8 K/uL (ref 19.0–186.0)
Retic Ct Pct: 1 % (ref 0.4–3.1)

## 2022-01-01 MED ORDER — HEPARIN SOD (PORK) LOCK FLUSH 100 UNIT/ML IV SOLN
500.0000 [IU] | Freq: Once | INTRAVENOUS | Status: AC
  Administered 2022-01-01: 500 [IU] via INTRAVENOUS

## 2022-01-01 MED ORDER — SODIUM CHLORIDE 0.9 % IV SOLN
200.0000 mg | Freq: Once | INTRAVENOUS | Status: AC
  Administered 2022-01-01: 200 mg via INTRAVENOUS
  Filled 2022-01-01: qty 200

## 2022-01-01 MED ORDER — PFIZER COVID-19 VAC BIVALENT 30 MCG/0.3ML IM SUSP
INTRAMUSCULAR | 0 refills | Status: DC
  Filled 2022-01-01: qty 0.3, 1d supply, fill #0

## 2022-01-01 MED ORDER — SODIUM CHLORIDE 0.9% FLUSH
10.0000 mL | INTRAVENOUS | Status: DC | PRN
  Administered 2022-01-01: 10 mL via INTRAVENOUS

## 2022-01-01 MED ORDER — SODIUM CHLORIDE 0.9 % IV SOLN: INTRAVENOUS | Status: DC

## 2022-01-01 NOTE — Progress Notes (Signed)
Hematology and Oncology Follow Up Visit  Shannon Scott [id number] 1940-12-12 81 y.o. 01/01/2022   Principle Diagnosis:  Locally advanced/recurrent endometrial carcinoma undifferentiated --  TMB (HIGH) / MSI HIGH  Intermittent iron deficiency anemia   Past Therapy: Radiation therapy for 5 -5 1/2 weeks - s/p 20 fractions Taxol/carboplatinum q 7 days -- s/p cycle 6    Current Therapy:        Pembrolizumab 400 mg q 12 weeks, s/p cycle 20 (originally started on 01/23/2019 at 400 mg IV q 6 wk)  --on hold for patient request since April 2022 Zometa 4 mg IV q 3 months -- next dose on 12/2021 IV iron as indicated --Venofer given 12/02/2021     Interim History:  Shannon Scott is here today for follow-up.  She looks pretty good.  She feels okay.  She still has a lot of lymphedema in the left leg.  We did give her some IV back in early July.  We did do a CT scan on her.  This was done on 12/02/2021.  This did not show any evidence of recurrent cancer.  She is done incredibly well.  She been off pembrolizumab now for over a year.  She has had the large hernia associated with the ostomy.  There is been no obstruction.  She has had no cough or shortness of breath.  She has had no rashes.  She has little bit of stasis dermatitis in the left lower leg.  There is no fever.  She has had no nausea or vomiting.  Overall, I would say her performance status is ECOG 1.    Medications:  Allergies as of 01/01/2022       Reactions   Statins Other (See Comments)   Myalgias and memory problems   Ciprofloxacin Itching, Other (See Comments)   Splotchy redness with itching during IV infusion localized to arm. Other reaction(s): itching/swelling/redness at injection site        Medication List        Accurate as of January 01, 2022 11:33 AM. If you have any questions, ask your nurse or doctor.          apixaban 5 MG Tabs tablet Commonly known as: ELIQUIS Take 1 tablet (5 mg total) by mouth 2  (two) times daily.   clobetasol ointment 0.05 % Commonly known as: TEMOVATE Apply 1 application topically 3 (three) times a week.   diltiazem 300 MG 24 hr capsule Commonly known as: TIAZAC TAKE 1 CAPSULE(300 MG) BY MOUTH DAILY What changed: See the new instructions.   ezetimibe 10 MG tablet Commonly known as: ZETIA Take 10 mg by mouth daily.   levothyroxine 50 MCG tablet Commonly known as: SYNTHROID Take 1 tablet (50 mcg total) by mouth daily before breakfast.   mometasone 0.1 % cream Commonly known as: ELOCON Apply topically.   ondansetron 8 MG disintegrating tablet Commonly known as: ZOFRAN-ODT Take 8 mg by mouth as needed.   polyethylene glycol 17 g packet Commonly known as: MIRALAX / GLYCOLAX Take 17 g by mouth daily as needed for severe constipation.   Repatha SureClick 638 MG/ML Soaj Generic drug: Evolocumab INJECT 1 DOSE(140MG) UNDER THE SKIN EVERY 14 DAYS   traMADol 50 MG tablet Commonly known as: ULTRAM TAKE 2 TABLETS(100 MG) BY MOUTH TWICE DAILY        Allergies:  Allergies  Allergen Reactions   Statins Other (See Comments)    Myalgias and memory problems   Ciprofloxacin Itching and Other (See  Comments)    Splotchy redness with itching during IV infusion localized to arm. Other reaction(s): itching/swelling/redness at injection site    Past Medical History, Surgical history, Social history, and Family History were reviewed and updated.  Review of Systems: Review of Systems  Constitutional: Negative.   HENT: Negative.    Eyes: Negative.   Respiratory: Negative.    Cardiovascular:  Positive for leg swelling.  Gastrointestinal: Negative.   Genitourinary: Negative.   Musculoskeletal:  Positive for back pain.  Skin: Negative.   Neurological: Negative.   Endo/Heme/Allergies: Negative.   Psychiatric/Behavioral: Negative.       Physical Exam:  weight is 148 lb (67.1 kg). Her oral temperature is 98 F (36.7 C). Her blood pressure is 147/45  (abnormal) and her pulse is 60. Her respiration is 18 and oxygen saturation is 98%.   Wt Readings from Last 3 Encounters:  01/01/22 148 lb (67.1 kg)  11/23/21 148 lb (67.1 kg)  11/20/21 147 lb 12.8 oz (67 kg)    Physical Exam Vitals reviewed.  HENT:     Head: Normocephalic and atraumatic.  Eyes:     Pupils: Pupils are equal, round, and reactive to light.  Cardiovascular:     Rate and Rhythm: Normal rate and regular rhythm.     Heart sounds: Normal heart sounds.     Comments: Cardiac exam shows a regular rate and rhythm.  She has a 1/6 systolic ejection murmur.   Pulmonary:     Effort: Pulmonary effort is normal.     Breath sounds: Normal breath sounds.  Abdominal:     General: Bowel sounds are normal.     Palpations: Abdomen is soft.     Comments: Abdominal exam shows a soft abdomen.  She has a colostomy that is intact.  She has no fluid wave.  There is no obvious abdominal mass.  There is no palpable liver or spleen tip.  Musculoskeletal:        General: No tenderness or deformity. Normal range of motion.     Cervical back: Normal range of motion.     Comments: She has significant lymphedema in her legs.  She has probably 3+ in the left leg and 2+ in the right leg.  I do not see any erythema.  She has pitting edema.  She has decent range of motion of her joints.  Lymphadenopathy:     Cervical: No cervical adenopathy.  Skin:    General: Skin is warm and dry.     Findings: No erythema or rash.  Neurological:     Mental Status: She is alert and oriented to person, place, and time.  Psychiatric:        Behavior: Behavior normal.        Thought Content: Thought content normal.        Judgment: Judgment normal.      Lab Results  Component Value Date   WBC 4.8 01/01/2022   HGB 10.8 (L) 01/01/2022   HCT 33.3 (L) 01/01/2022   MCV 90.2 01/01/2022   PLT 249 01/01/2022   Lab Results  Component Value Date   FERRITIN 251 11/20/2021   IRON 20 (L) 11/20/2021   TIBC 216 (L)  11/20/2021   UIBC 196 11/20/2021   IRONPCTSAT 9 (L) 11/20/2021   Lab Results  Component Value Date   RETICCTPCT 1.0 01/01/2022   RBC 3.69 (L) 01/01/2022   RBC 3.62 (L) 01/01/2022   No results found for: "KPAFRELGTCHN", "LAMBDASER", "KAPLAMBRATIO" No results found for: "  IGGSERUM", "IGA", "IGMSERUM" No results found for: "TOTALPROTELP", "ALBUMINELP", "A1GS", "A2GS", "BETS", "BETA2SER", "GAMS", "MSPIKE", "SPEI"   Chemistry      Component Value Date/Time   NA 140 01/01/2022 0959   NA 136 (A) 09/08/2017 0000   K 3.8 01/01/2022 0959   CL 106 01/01/2022 0959   CO2 27 01/01/2022 0959   BUN 17 01/01/2022 0959   BUN 8 09/08/2017 0000   CREATININE 0.84 01/01/2022 0959   CREATININE 0.69 08/02/2016 1519   GLU 111 09/08/2017 0000      Component Value Date/Time   CALCIUM 9.4 01/01/2022 0959   ALKPHOS 65 01/01/2022 0959   AST 13 (L) 01/01/2022 0959   ALT 8 01/01/2022 0959   BILITOT 0.3 01/01/2022 0959       Impression and Plan: Ms. Trovato is a very pleasant 81 yo caucasian female with recurrent undifferentiated endometrial carcinoma.   She was on immunotherapy.  She has a high MSI.  She really has done well with immunotherapy.  The recent  CT scan confirms this.  I do not think we need another CAT scan 5 for another 4 to 5 months.  We do have to follow her up with her labs.  We will see her iron studies look like.  Of note, her erythropoietin level is only 10.6.  As such, we could certainly consider ESA for her.  However, I would like to try to minimize this if I needed to.  I will plan to get him back to see [place] in another couple months.   Volanda Napoleon, MD 8/4/202311:33 AM

## 2022-01-01 NOTE — Patient Instructions (Signed)

## 2022-01-01 NOTE — Progress Notes (Signed)
   Covid-19 Vaccination Clinic  Name:  TABITA CORBO    MRN: [id number] DOB: 1941/04/06  01/01/2022  Ms. Cisney was observed post Covid-19 immunization for 15 minutes without incident. She was provided with Vaccine Information Sheet and instruction to access the V-Safe system.   Ms. Kot was instructed to call 911 with any severe reactions post vaccine: Difficulty breathing  Swelling of face and throat  A fast heartbeat  A bad rash all over body  Dizziness and weakness   Immunizations Administered     Name Date Dose VIS Date Route   Pfizer Covid-19 Vaccine Bivalent Booster 01/01/2022 12:44 PM 0.3 mL 01/28/2021 Intramuscular   Manufacturer: [place]   Lot: [id number]   [place]: [phone]

## 2022-01-03 LAB — CA 125: Cancer Antigen (CA) 125: 10.7 U/mL (ref 0.0–38.1)

## 2022-01-05 ENCOUNTER — Encounter (HOSPITAL_BASED_OUTPATIENT_CLINIC_OR_DEPARTMENT_OTHER): Payer: Medicare Other | Attending: General Surgery | Admitting: General Surgery

## 2022-01-05 DIAGNOSIS — Z923 Personal history of irradiation: Secondary | ICD-10-CM | POA: Insufficient documentation

## 2022-01-05 DIAGNOSIS — Z8673 Personal history of transient ischemic attack (TIA), and cerebral infarction without residual deficits: Secondary | ICD-10-CM | POA: Diagnosis not present

## 2022-01-05 DIAGNOSIS — I350 Nonrheumatic aortic (valve) stenosis: Secondary | ICD-10-CM | POA: Diagnosis not present

## 2022-01-05 DIAGNOSIS — I25119 Atherosclerotic heart disease of native coronary artery with unspecified angina pectoris: Secondary | ICD-10-CM | POA: Insufficient documentation

## 2022-01-05 DIAGNOSIS — Z9079 Acquired absence of other genital organ(s): Secondary | ICD-10-CM | POA: Insufficient documentation

## 2022-01-05 DIAGNOSIS — Z90722 Acquired absence of ovaries, bilateral: Secondary | ICD-10-CM | POA: Diagnosis not present

## 2022-01-05 DIAGNOSIS — Z8542 Personal history of malignant neoplasm of other parts of uterus: Secondary | ICD-10-CM | POA: Insufficient documentation

## 2022-01-05 DIAGNOSIS — I890 Lymphedema, not elsewhere classified: Secondary | ICD-10-CM | POA: Diagnosis not present

## 2022-01-05 DIAGNOSIS — Z86718 Personal history of other venous thrombosis and embolism: Secondary | ICD-10-CM | POA: Diagnosis not present

## 2022-01-05 DIAGNOSIS — Z9221 Personal history of antineoplastic chemotherapy: Secondary | ICD-10-CM | POA: Insufficient documentation

## 2022-01-05 DIAGNOSIS — L97822 Non-pressure chronic ulcer of other part of left lower leg with fat layer exposed: Secondary | ICD-10-CM | POA: Diagnosis not present

## 2022-01-05 DIAGNOSIS — Z951 Presence of aortocoronary bypass graft: Secondary | ICD-10-CM | POA: Insufficient documentation

## 2022-01-05 DIAGNOSIS — Z933 Colostomy status: Secondary | ICD-10-CM | POA: Insufficient documentation

## 2022-01-05 DIAGNOSIS — Z90710 Acquired absence of both cervix and uterus: Secondary | ICD-10-CM | POA: Insufficient documentation

## 2022-01-05 NOTE — Progress Notes (Signed)
Shannon Scott, Shannon Scott ([id number]) Visit Report for 01/05/2022 Abuse Risk Screen Details Patient Name: Date of Service: PA ABIR, EROH 01/05/2022 1:30 PM Medical Record Number: [id number] Patient Account Number: [id number] Date of Birth/Sex: Treating RN: June 26, 1940 (81 y.o. Harlow Ohms Primary Care Shannon Scott: Leeroy Cha Other Clinician: Referring Ouida Abeyta: Treating Ossiel Marchio/Extender: Arvella Merles Weeks in Treatment: 0 Abuse Risk Screen Items Answer ABUSE RISK SCREEN: Has anyone close to you tried to hurt or harm you recentlyo No Do you feel uncomfortable with anyone in your familyo No Has anyone forced you do things that you didnt want to doo No Electronic Signature(s) Signed: 01/05/2022 4:38:24 PM By: Adline Peals Entered By: Adline Peals on 01/05/2022 13:55:09 -------------------------------------------------------------------------------- Activities of Daily Living Details Patient Name: Date of Service: PA NYLEE, BARBUTO 01/05/2022 1:30 PM Medical Record Number: [id number] Patient Account Number: [id number] Date of Birth/Sex: Treating RN: 09-08-1940 (81 y.o. Harlow Ohms Primary Care Konnar Ben: Leeroy Cha Other Clinician: Referring Shannon Scott: Treating Constanza Scott/Extender: Arvella Merles Weeks in Treatment: 0 Activities of Daily Living Items Answer Activities of Daily Living (Please select one for each item) Drive Automobile Not Able T Medications ake Completely Able Use T elephone Completely Able Care for Appearance Completely Able Use T oilet Completely Able Bath / Shower Completely Able Dress Self Completely Able Feed Self Completely Able Walk Need Assistance Get In / Out Bed Completely Able Housework Need Assistance Prepare Meals Completely [place] for Self Completely Able Electronic Signature(s) Signed: 01/05/2022 4:38:24 PM By:  Adline Peals Entered By: Adline Peals on 01/05/2022 13:55:35 -------------------------------------------------------------------------------- Education Screening Details Patient Name: Date of Service: PA [name] 01/05/2022 1:30 PM Medical Record Number: [id number] Patient Account Number: [id number] Date of Birth/Sex: Treating RN: 09/06/1940 (81 y.o. Harlow Ohms Primary Care [name]: Leeroy Cha Other Clinician: Referring Shannon Scott: Treating [place]/Extender: Shannon Scott in Treatment: 0 Primary Learner Assessed: Patient Learning Preferences/Education Level/Primary Language Learning Preference: Explanation, Demonstration, Video, Printed Material Highest Education Level: College or Above Preferred Language: English Cognitive Barrier Language Barrier: No Translator Needed: No Memory Deficit: No Emotional Barrier: No Cultural/Religious Beliefs Affecting Medical Care: No Physical Barrier Impaired Vision: Yes Glasses Impaired Hearing: Yes Hearing Aid Decreased Hand dexterity: No Knowledge/Comprehension Knowledge Level: Medium Comprehension Level: Medium Ability to understand written instructions: Medium Ability to understand verbal instructions: Medium Motivation Anxiety Level: Calm Cooperation: Cooperative Education Importance: Acknowledges Need Interest in Health Problems: Asks Questions Perception: Coherent Willingness to Engage in Self-Management Medium Activities: Readiness to Engage in Self-Management Medium Activities: Electronic Signature(s) Signed: 01/05/2022 4:38:24 PM By: Adline Peals Entered By: Adline Peals on 01/05/2022 13:56:05 -------------------------------------------------------------------------------- Fall Risk Assessment Details Patient Name: Date of Service: PA [name] 01/05/2022 1:30 PM Medical Record Number: [id number] Patient Account Number:  [id number] Date of Birth/Sex: Treating RN: 1941-02-02 (82 y.o. Harlow Ohms Primary Care Shannon Scott: Leeroy Cha Other Clinician: Referring Shannon Scott: Treating Shannon Scott/Extender: Arvella Merles Weeks in Treatment: 0 Fall Risk Assessment Items Have you had 2 or more falls in the last 12 monthso 0 No Have you had any fall that resulted in injury in the last 12 monthso 0 No FALLS RISK SCREEN History of falling - immediate or within 3 months 0 No Secondary diagnosis (Do you have 2 or more medical diagnoseso) 0 No Ambulatory aid None/bed rest/wheelchair/nurse 0 No Crutches/cane/walker 15 Yes Furniture 0 No Intravenous therapy Access/Saline/Heparin Lock 0 No Gait/Transferring Normal/ bed rest/ wheelchair 0 Yes Weak (  short steps with or without shuffle, stooped but able to lift head while walking, may seek 10 Yes support from furniture) Impaired (short steps with shuffle, may have difficulty arising from chair, head down, impaired 0 No balance) Mental Status Oriented to own ability 0 No Electronic Signature(s) Signed: 01/05/2022 4:38:24 PM By: Adline Peals Entered By: Adline Peals on 01/05/2022 13:56:22 -------------------------------------------------------------------------------- Foot Assessment Details Patient Name: Date of Service: PA [name] 01/05/2022 1:30 PM Medical Record Number: [id number] Patient Account Number: [id number] Date of Birth/Sex: Treating RN: 1941/02/04 (81 y.o. Harlow Ohms Primary Care Shannon Scott: Leeroy Cha Other Clinician: Referring Erial Scott: Treating Shannon Scott/Extender: Arvella Merles Weeks in Treatment: 0 Foot Assessment Items Site Locations + = Sensation present, - = Sensation absent, C = Callus, U = Ulcer R = Redness, W = Warmth, M = Maceration, PU = Pre-ulcerative lesion F = Fissure, S = Swelling, D = Dryness Assessment Right: Left: Other  Deformity: No No Prior Foot Ulcer: No No Prior Amputation: No No Charcot Joint: No No Ambulatory Status: Ambulatory With Help Assistance Device: Cane Gait: Steady Electronic Signature(s) Signed: 01/05/2022 4:38:24 PM By: Adline Peals Entered By: Adline Peals on 01/05/2022 13:56:51 -------------------------------------------------------------------------------- Nutrition Risk Screening Details Patient Name: Date of Service: PA Shannon Scott, Shannon Scott 01/05/2022 1:30 PM Medical Record Number: [id number] Patient Account Number: [id number] Date of Birth/Sex: Treating RN: July 11, 1940 (81 y.o. Harlow Ohms Primary Care Metta Koranda: Leeroy Cha Other Clinician: Referring Bernis Stecher: Treating Stephanie Mcglone/Extender: Arvella Merles Weeks in Treatment: 0 Height (in): 61 Weight (lbs): 145 Body Mass Index (BMI): 27.4 Nutrition Risk Screening Items Score Screening NUTRITION RISK SCREEN: I have an illness or condition that made me change the kind and/or amount of food I eat 0 No I eat fewer than two meals per day 0 No I eat few fruits and vegetables, or milk products 0 No I have three or more drinks of beer, liquor or wine almost every day 0 No I have tooth or mouth problems that make it hard for me to eat 0 No I don't always have enough money to buy the food I need 0 No I eat alone most of the time 0 No I take three or more different prescribed or over-the-counter drugs a day 1 Yes Without wanting to, I have lost or gained 10 pounds in the last six months 0 No I am not always physically able to shop, cook and/or feed myself 0 No Nutrition Protocols Good Risk Protocol 0 No interventions needed Moderate Risk Protocol High Risk Proctocol Risk Level: Good Risk Score: 1 Electronic Signature(s) Signed: 01/05/2022 4:38:24 PM By: Adline Peals Entered By: Adline Peals on 01/05/2022 13:56:40

## 2022-01-05 NOTE — Progress Notes (Signed)
Shannon Scott, Shannon Scott ([id number]) Visit Report for 01/05/2022 Chief Complaint Document Details Patient Name: Date of Service: PA JAIDAN, STACHNIK 01/05/2022 1:30 PM Medical Record Number: [id number] Patient Account Number: [id number] Date of Birth/Sex: Treating RN: [birth year] (81 y.o. F) Primary Care Provider: Leeroy Cha Other Clinician: Referring Provider: Treating Provider/Extender: Lexine Baton in Treatment: 0 Information Obtained from: Patient Chief Complaint Patient presents to the wound care center due with non-wound condition(s)--lymphedema stage III Electronic Signature(s) Signed: 01/05/2022 2:38:36 PM By: Fredirick Maudlin MD FACS Entered By: Fredirick Maudlin on 01/05/2022 14:38:36 -------------------------------------------------------------------------------- HPI Details Patient Name: Date of Service: PA Shannon Score R. 01/05/2022 1:30 PM Medical Record Number: [id number] Patient Account Number: [id number] Date of Birth/Sex: Treating RN: 05/21/1941 (81 y.o. F) Primary Care Provider: Leeroy Cha Other Clinician: Referring Provider: Treating Provider/Extender: Arvella Merles Weeks in Treatment: 0 History of Present Illness HPI Description: CONSULTATION ONLY 01/05/2022 This is an 81 year old woman with multiple medical comorbidities including aortic stenosis, coronary artery disease status post multivessel CABG, endometrial carcinoma status post robotic total hysterectomy with bilateral salpingo-oophorectomy and sentinel node biopsy with subsequent radiation and chemotherapy, atrial fibrillation, stroke, colonic mass status post diverting loop colostomy, prior left lower extremity DVT chronic pain, and intolerance to multiple medications. , She presents to the wound care center today without any open wounds but she does have stage III severe lymphedema, left leg worse than right. She has skin changes and  stasis dermatitis consistent with longstanding lymphatic obstruction, likely secondary to her malignancies and subsequent treatment. She is physically incapable of vigorous exercise due to her coronary artery disease and aortic stenosis, but she has been seen in a local lymphedema clinic and compression wraps have been applied. Nonetheless, she continues to have severe swelling of her legs. She is intolerant of diuretics apparently. She has not used lymphedema pumps in the past. Electronic Signature(s) Signed: 01/05/2022 2:42:32 PM By: Fredirick Maudlin MD FACS Entered By: Fredirick Maudlin on 01/05/2022 14:42:32 -------------------------------------------------------------------------------- Physical Exam Details Patient Name: Date of Service: PA Shannon Score R. 01/05/2022 1:30 PM Medical Record Number: [id number] Patient Account Number: [id number] Date of Birth/Sex: Treating RN: 09/03/1940 (81 y.o. F) Primary Care Provider: Leeroy Cha Other Clinician: Referring Provider: Treating Provider/Extender: Illene Silver, Rupashree Weeks in Treatment: 0 Constitutional Hypertensive, asymptomatic. . . . No acute distress. Respiratory Normal work of breathing on room air.. Cardiovascular Skin changes consistent with longstanding lymphedema.. Notes 01/05/2022: She does not have any open wounds, but she has 3+ pitting edema on the left and 2+ pitting edema on the right. She has skin changes consistent with longstanding lymphedema and I would consider her to be stage III. Electronic Signature(s) Signed: 01/05/2022 2:43:47 PM By: Fredirick Maudlin MD FACS Entered By: Fredirick Maudlin on 01/05/2022 14:43:47 -------------------------------------------------------------------------------- Physician Orders Details Patient Name: Date of Service: PA Shannon Score R. 01/05/2022 1:30 PM Medical Record Number: [id number] Patient Account Number: [id number] Date of Birth/Sex: Treating  RN: September 17, 1940 (81 y.o. Harlow Ohms Primary Care Provider: Leeroy Cha Other Clinician: Referring Provider: Treating Provider/Extender: Lexine Baton in Treatment: 0 Verbal / Phone Orders: No Diagnosis Coding ICD-10 Coding Code Description I89.0 Lymphedema, not elsewhere classified C54.1 Malignant neoplasm of endometrium Z92.3 Personal history of irradiation I35.0 Nonrheumatic aortic (valve) stenosis I82.402 Acute embolism and thrombosis of unspecified deep veins of left lower extremity Z95.1 Presence of aortocoronary bypass graft I25.119 Atherosclerotic heart disease of native coronary artery with unspecified angina pectoris Discharge From [facility] Services  Discharge from [place] - consultation for lymphedema Edema Control - Lymphedema / SCD / Other Lymphedema Pumps. Use Lymphedema pumps on leg(s) 2-3 times a day for 45-60 minutes. If wearing any wraps or hose, do not remove them. Continue exercising as instructed. - when available - will order in clinic Elevate legs to the level of the heart or above for 30 minutes daily and/or when sitting, a frequency of: Avoid standing for long periods of time. Patient to wear own compression stockings every day. Additional Orders / Instructions Other: - talk to primary care doctor about a fluid pill Electronic Signature(s) Signed: 01/05/2022 2:44:02 PM By: Fredirick Maudlin MD FACS Entered By: Fredirick Maudlin on 01/05/2022 14:44:01 -------------------------------------------------------------------------------- Problem List Details Patient Name: Date of Service: PA Shannon Score R. 01/05/2022 1:30 PM Medical Record Number: [id number] Patient Account Number: [id number] Date of Birth/Sex: Treating RN: 1940-07-01 (81 y.o. F) Primary Care Provider: Leeroy Cha Other Clinician: Referring Provider: Treating Provider/Extender: Illene Silver, Ronie Spies Weeks in  Treatment: 0 Active Problems ICD-10 Encounter Code Description Active Date MDM Diagnosis I89.0 Lymphedema, not elsewhere classified 01/05/2022 No Yes C54.1 Malignant neoplasm of endometrium 01/05/2022 No Yes Z92.3 Personal history of irradiation 01/05/2022 No Yes I35.0 Nonrheumatic aortic (valve) stenosis 01/05/2022 No Yes I82.402 Acute embolism and thrombosis of unspecified deep veins of left lower 01/05/2022 No Yes extremity Z95.1 Presence of aortocoronary bypass graft 01/05/2022 No Yes I25.119 Atherosclerotic heart disease of native coronary artery with unspecified angina 01/05/2022 No Yes pectoris Inactive Problems Resolved Problems Electronic Signature(s) Signed: 01/05/2022 2:35:58 PM By: Fredirick Maudlin MD FACS Entered By: Fredirick Maudlin on 01/05/2022 14:35:58 -------------------------------------------------------------------------------- Progress Note Details Patient Name: Date of Service: PA Shannon Score R. 01/05/2022 1:30 PM Medical Record Number: [id number] Patient Account Number: [id number] Date of Birth/Sex: Treating RN: 1940/10/28 (81 y.o. F) Primary Care Provider: Leeroy Cha Other Clinician: Referring Provider: Treating Provider/Extender: Lexine Baton in Treatment: 0 Subjective Chief Complaint Information obtained from Patient Patient presents to the wound care center due with non-wound condition(s)--lymphedema stage III History of Present Illness (HPI) CONSULTATION ONLY 01/05/2022 This is an 81 year old woman with multiple medical comorbidities including aortic stenosis, coronary artery disease status post multivessel CABG, endometrial carcinoma status post robotic total hysterectomy with bilateral salpingo-oophorectomy and sentinel node biopsy with subsequent radiation and chemotherapy, atrial fibrillation, stroke, colonic mass status post diverting loop colostomy, prior left lower extremity DVT chronic pain, and intolerance to  multiple medications. , She presents to the wound care center today without any open wounds but she does have stage III severe lymphedema, left leg worse than right. She has skin changes and stasis dermatitis consistent with longstanding lymphatic obstruction, likely secondary to her malignancies and subsequent treatment. She is physically incapable of vigorous exercise due to her coronary artery disease and aortic stenosis, but she has been seen in a local lymphedema clinic and compression wraps have been applied. Nonetheless, she continues to have severe swelling of her legs. She is intolerant of diuretics apparently. She has not used lymphedema pumps in the past. Patient History Information obtained from Patient. Allergies Statins-HMG-CoA Reductase Inhibitors, ciprofloxacin Family History Cancer - Siblings, Heart Disease - Mother,Father, Hypertension - Mother,Father, Stroke - Father, No family history of Diabetes, Hereditary Spherocytosis, Kidney Disease, Lung Disease, Seizures, Thyroid Problems, Tuberculosis. Social History Never smoker, Marital Status - Widowed, Alcohol Use - Never, Drug Use - No History, Caffeine Use - Never. Medical History Respiratory Patient has history of Asthma Cardiovascular Patient has history of Arrhythmia -  a-fib, Coronary Artery Disease, Hypertension Musculoskeletal Patient has history of Osteoarthritis Oncologic Patient has history of Received Chemotherapy, Received Radiation Hospitalization/Surgery History - total hysterectomy with bilateral salpingo oophorectomy. - cystoscopy w/ ureteral stent placement. - IR fluro guide port insertion. - transthoracic echocardiogram. - coronary artery bypass graft. - tonsillectomy. - tubal ligation. - IR US guide vac access right. Medical A Surgical History Notes nd Endocrine hypothyroidism Musculoskeletal BURSITIS IN L HIP Neurologic stroke Review of Systems (ROS) Constitutional Symptoms (General  Health) Denies complaints or symptoms of Fatigue, Fever, Chills, Marked Weight Change. Eyes Complains or has symptoms of Glasses / Contacts - glasses. Ear/Nose/Mouth/Throat Denies complaints or symptoms of Chronic sinus problems or rhinitis. Respiratory Denies complaints or symptoms of Chronic or frequent coughs, Shortness of Breath. Gastrointestinal Denies complaints or symptoms of Frequent diarrhea, Nausea, Vomiting. Endocrine Denies complaints or symptoms of Heat/cold intolerance. Genitourinary Denies complaints or symptoms of Frequent urination. Integumentary (Skin) Denies complaints or symptoms of Wounds. Oncologic uterus Objective Constitutional Hypertensive, asymptomatic. No acute distress. Vitals Time Taken: 1:46 PM, Height: 61 in, Source: Stated, Weight: 145 lbs, Source: Stated, BMI: 27.4, Temperature: 98.3 F, Pulse: 76 bpm, Respiratory Rate: 18 breaths/min, Blood Pressure: 160/62 mmHg. Respiratory Normal work of breathing on room air.. Cardiovascular Skin changes consistent with longstanding lymphedema.. General Notes: 01/05/2022: She does not have any open wounds, but she has 3+ pitting edema on the left and 2+ pitting edema on the right. She has skin changes consistent with longstanding lymphedema and I would consider her to be stage III. Assessment Active Problems ICD-10 Lymphedema, not elsewhere classified Malignant neoplasm of endometrium Personal history of irradiation Nonrheumatic aortic (valve) stenosis Acute embolism and thrombosis of unspecified deep veins of left lower extremity Presence of aortocoronary bypass graft Atherosclerotic heart disease of native coronary artery with unspecified angina pectoris Plan Discharge From [facility] Services: Discharge from [place] - consultation for lymphedema Edema Control - Lymphedema / SCD / Other: Lymphedema Pumps. Use Lymphedema pumps on leg(s) 2-3 times a day for 45-60 minutes. If wearing any wraps or hose,  do not remove them. Continue exercising as instructed. - when available - will order in clinic Elevate legs to the level of the heart or above for 30 minutes daily and/or when sitting, a frequency of: Avoid standing for long periods of time. Patient to wear own compression stockings every day. Additional Orders / Instructions: Other: - talk to primary care doctor about a fluid pill 01/05/2022: This is an 81 year old woman with multiple medical comorbidities. She has aortic stenosis and coronary artery disease which limit her ability to exercise vigorously. She has a history of endometrial cancer status post surgery and radiation/chemotherapy. She also has a near obstructing colonic mass for which she has a diverting transverse loop colostomy. She does not have any open wounds, but she has 3+ pitting edema on the left and 2+ pitting edema on the right. She has skin changes consistent with longstanding lymphedema and I would consider her to be stage III.Compression has been attempted with out significant impact on her lymphedema. I think she would benefit from lymphedema pumps and she is amenable to this modality. We will place a request to have these delivered to her home. I also encouraged her to discuss the use of a diuretic with her primary care provider, but it seems that she is intolerant to many medications based upon my interpretation of the electronic medical record. As she does not have any wounds, she does not need to follow-up in  the wound care center but I did ask her to contact us if she has not heard from the lymphedema pump representative within 2 weeks time. Follow-up as needed. Electronic Signature(s) Signed: 01/05/2022 2:46:56 PM By: Fredirick Maudlin MD FACS Entered By: Fredirick Maudlin on 01/05/2022 14:46:56 -------------------------------------------------------------------------------- HxROS Details Patient Name: Date of Service: PA Shannon Scott, Shannon R. 01/05/2022 1:30 PM Medical  Record Number: [id number] Patient Account Number: [id number] Date of Birth/Sex: Treating RN: 1940/07/22 (81 y.o. Harlow Ohms Primary Care Provider: Leeroy Cha Other Clinician: Referring Provider: Treating Provider/Extender: Arvella Merles Weeks in Treatment: 0 Information Obtained From Patient Constitutional Symptoms (General Health) Complaints and Symptoms: Negative for: Fatigue; Fever; Chills; Marked Weight Change Eyes Complaints and Symptoms: Positive for: Glasses / Contacts - glasses Ear/Nose/Mouth/Throat Complaints and Symptoms: Negative for: Chronic sinus problems or rhinitis Respiratory Complaints and Symptoms: Negative for: Chronic or frequent coughs; Shortness of Breath Medical History: Positive for: Asthma Gastrointestinal Complaints and Symptoms: Negative for: Frequent diarrhea; Nausea; Vomiting Endocrine Complaints and Symptoms: Negative for: Heat/cold intolerance Medical History: Past Medical History Notes: hypothyroidism Genitourinary Complaints and Symptoms: Negative for: Frequent urination Integumentary (Skin) Complaints and Symptoms: Negative for: Wounds Hematologic/Lymphatic Cardiovascular Medical History: Positive for: Arrhythmia - a-fib; Coronary Artery Disease; Hypertension Immunological Musculoskeletal Medical History: Positive for: Osteoarthritis Past Medical History Notes: BURSITIS IN L HIP Neurologic Medical History: Past Medical History Notes: stroke Oncologic Complaints and Symptoms: Review of System Notes: uterus Medical History: Positive for: Received Chemotherapy; Received Radiation Immunizations Pneumococcal Vaccine: Received Pneumococcal Vaccination: No Implantable Devices None Hospitalization / Surgery History Type of Hospitalization/Surgery total hysterectomy with bilateral salpingo oophorectomy cystoscopy w/ ureteral stent placement IR fluro guide port  insertion transthoracic echocardiogram coronary artery bypass graft tonsillectomy tubal ligation IR US guide vac access right Family and Social History Cancer: Yes - Siblings; Diabetes: No; Heart Disease: Yes - Mother,Father; Hereditary Spherocytosis: No; Hypertension: Yes - Mother,Father; Kidney Disease: No; Lung Disease: No; Seizures: No; Stroke: Yes - Father; Thyroid Problems: No; Tuberculosis: No; Never smoker; Marital Status - Widowed; Alcohol Use: Never; Drug Use: No History; Caffeine Use: Never; Financial Concerns: No; Food, Clothing or Shelter Needs: No; Support System Lacking: No; Transportation Concerns: No Electronic Signature(s) Signed: 01/05/2022 3:45:45 PM By: Fredirick Maudlin MD FACS Signed: 01/05/2022 4:38:24 PM By: Adline Peals Entered By: Adline Peals on 01/05/2022 14:29:22 -------------------------------------------------------------------------------- SuperBill Details Patient Name: Date of Service: PA Shannon Boers. 01/05/2022 Medical Record Number: [id number] Patient Account Number: [id number] Date of Birth/Sex: Treating RN: March 19, 1941 (81 y.o. Harlow Ohms Primary Care Provider: Leeroy Cha Other Clinician: Referring Provider: Treating Provider/Extender: Arvella Merles Weeks in Treatment: 0 Diagnosis Coding ICD-10 Codes Code Description I89.0 Lymphedema, not elsewhere classified C54.1 Malignant neoplasm of endometrium Z92.3 Personal history of irradiation I35.0 Nonrheumatic aortic (valve) stenosis I82.402 Acute embolism and thrombosis of unspecified deep veins of left lower extremity Z95.1 Presence of aortocoronary bypass graft I25.119 Atherosclerotic heart disease of native coronary artery with unspecified angina pectoris Facility Procedures CPT4 Code: [id number] Description: 9213 - WOUND CARE VISIT-LEV 3 EST PT Modifier: Quantity: 1 Physician Procedures : CPT4 Code Description Modifier [id number]  [id number] - WC PHYS LEVEL 4 - NEW PT 25 ICD-10 Diagnosis Description I89.0 Lymphedema, not elsewhere classified C54.1 Malignant neoplasm of endometrium Z92.3 Personal history of irradiation I82.402 Acute embolism and  thrombosis of unspecified deep veins of left lower extremity Quantity: 1 Electronic Signature(s) Signed: 01/05/2022 2:47:19 PM By: Fredirick Maudlin MD FACS Entered By: Fredirick Maudlin on 01/05/2022 14:47:19

## 2022-01-05 NOTE — Progress Notes (Signed)
Shannon, Scott ([id number]) Visit Report for 01/05/2022 Allergy List Details Patient Name: Date of Service: PA Shannon, Scott 01/05/2022 1:30 PM Medical Record Number: [id number] Patient Account Number: [id number] Date of Birth/Sex: Treating RN: 1941-03-21 (81 y.o. Shannon Scott Primary Care Brayton Baumgartner: Leeroy Cha Other Clinician: Referring Soley Harriss: Treating Juanita Streight/Extender: Illene Silver, Rupashree Weeks in Treatment: 0 Allergies Active Allergies Statins-HMG-CoA Reductase Inhibitors ciprofloxacin Allergy Notes Electronic Signature(s) Signed: 01/05/2022 4:38:24 PM By: Adline Peals Entered By: Adline Peals on 01/05/2022 13:49:01 -------------------------------------------------------------------------------- Arrival Information Details Patient Name: Date of Service: PA Shannon Score R. 01/05/2022 1:30 PM Medical Record Number: [id number] Patient Account Number: [id number] Date of Birth/Sex: Treating RN: [birth year] (81 y.o. Shannon Scott Primary Care Laiylah Roettger: Leeroy Cha Other Clinician: Referring Maryellen Dowdle: Treating Franke Menter/Extender: Lexine Baton in Treatment: 0 Visit Information Patient Arrived: Cane Arrival Time: 13:45 Accompanied By: self Transfer Assistance: None Patient Identification Verified: Yes Secondary Verification Process Completed: Yes Patient Requires Transmission-Based Precautions: No Patient Has Alerts: No Electronic Signature(s) Signed: 01/05/2022 4:38:24 PM By: Adline Peals Entered By: Adline Peals on 01/05/2022 13:45:44 -------------------------------------------------------------------------------- Clinic Level of Care Assessment Details Patient Name: Date of Service: PA Shannon, Scott 01/05/2022 1:30 PM Medical Record Number: [id number] Patient Account Number: [id number] Date of Birth/Sex: Treating RN: 11-Apr-1941 (81 y.o. Shannon Scott Primary Care Gerrod Maule: Leeroy Cha Other Clinician: Referring Keyuna Cuthrell: Treating Bani Gianfrancesco/Extender: Arvella Merles Weeks in Treatment: 0 Clinic Level of Care Assessment Items TOOL 2 Quantity Score X- 1 0 Use when only an EandM is performed on the INITIAL visit ASSESSMENTS - Nursing Assessment / Reassessment X- 1 20 General Physical Exam (combine w/ comprehensive assessment (listed just below) when performed on new pt. evals) X- 1 25 Comprehensive Assessment (HX, ROS, Risk Assessments, Wounds Hx, etc.) ASSESSMENTS - Wound and Skin A ssessment / Reassessment []  - 0 Simple Wound Assessment / Reassessment - one wound []  - 0 Complex Wound Assessment / Reassessment - multiple wounds X- 1 10 Dermatologic / Skin Assessment (not related to wound area) ASSESSMENTS - Ostomy and/or Continence Assessment and Care []  - 0 Incontinence Assessment and Management []  - 0 Ostomy Care Assessment and Management (repouching, etc.) PROCESS - Coordination of Care X - Simple Patient / Family Education for ongoing care 1 15 []  - 0 Complex (extensive) Patient / Family Education for ongoing care X- 1 10 Staff obtains Programmer, systems, Records, T Results / Process Orders est []  - 0 Staff telephones HHA, Nursing Homes / Clarify orders / etc []  - 0 Routine Transfer to another Facility (non-emergent condition) []  - 0 Routine Hospital Admission (non-emergent condition) X- 1 15 New Admissions / Biomedical engineer / Ordering NPWT Apligraf, etc. , []  - 0 Emergency Hospital Admission (emergent condition) X- 1 10 Simple Discharge Coordination []  - 0 Complex (extensive) Discharge Coordination PROCESS - Special Needs []  - 0 Pediatric / Minor Patient Management []  - 0 Isolation Patient Management []  - 0 Hearing / Language / Visual special needs []  - 0 Assessment of Community assistance (transportation, D/C planning, etc.) []  - 0 Additional assistance /  Altered mentation []  - 0 Support Surface(s) Assessment (bed, cushion, seat, etc.) INTERVENTIONS - Wound Cleansing / Measurement []  - 0 Wound Imaging (photographs - any number of wounds) []  - 0 Wound Tracing (instead of photographs) []  - 0 Simple Wound Measurement - one wound []  - 0 Complex Wound Measurement - multiple wounds []  - 0 Simple Wound Cleansing - one wound []  - 0 Complex  Wound Cleansing - multiple wounds INTERVENTIONS - Wound Dressings []  - 0 Small Wound Dressing one or multiple wounds []  - 0 Medium Wound Dressing one or multiple wounds []  - 0 Large Wound Dressing one or multiple wounds []  - 0 Application of Medications - injection INTERVENTIONS - Miscellaneous []  - 0 External ear exam []  - 0 Specimen Collection (cultures, biopsies, blood, body fluids, etc.) []  - 0 Specimen(s) / Culture(s) sent or taken to Lab for analysis []  - 0 Patient Transfer (multiple staff / Harrel Lemon Lift / Similar devices) []  - 0 Simple Staple / Suture removal (25 or less) []  - 0 Complex Staple / Suture removal (26 or more) []  - 0 Hypo / Hyperglycemic Management (close monitor of Blood Glucose) []  - 0 Ankle / Brachial Index (ABI) - do not check if billed separately Has the patient been seen at the hospital within the last three years: Yes Total Score: 105 Level Of Care: New/Established - Level 3 Electronic Signature(s) Signed: 01/05/2022 4:38:24 PM By: Adline Peals Entered By: Adline Peals on 01/05/2022 14:08:27 -------------------------------------------------------------------------------- Encounter Discharge Information Details Patient Name: Date of Service: PA Shannon Score R. 01/05/2022 1:30 PM Medical Record Number: [id number] Patient Account Number: [id number] Date of Birth/Sex: Treating RN: [birth year] (81 y.o. Shannon Scott Primary Care Kahleel Fadeley: Leeroy Cha Other Clinician: Referring Chiante Peden: Treating Kaetlyn Noa/Extender: Lexine Baton in Treatment: 0 Encounter Discharge Information Items Discharge Condition: Stable Ambulatory Status: Cane Discharge Destination: Home Transportation: Private Auto Accompanied By: self Schedule Follow-up Appointment: Yes Clinical Summary of Care: Patient Declined Electronic Signature(s) Signed: 01/05/2022 4:38:24 PM By: Adline Peals Entered By: Adline Peals on 01/05/2022 14:11:04 -------------------------------------------------------------------------------- Lower Extremity Assessment Details Patient Name: Date of Service: PA KAROLYNN, INFANTINO 01/05/2022 1:30 PM Medical Record Number: [id number] Patient Account Number: [id number] Date of Birth/Sex: Treating RN: March 21, 1941 (81 y.o. Shannon Scott Primary Care Montine Hight: Leeroy Cha Other Clinician: Referring Summer Mccolgan: Treating Renarda Mullinix/Extender: Arvella Merles Weeks in Treatment: 0 Edema Assessment Assessed: [Left: No] [Right: No] E[Left: dema] [Right: :] Calf Left: Right: Point of Measurement: From Medial Instep 49.4 cm Ankle Left: Right: Point of Measurement: From Medial Instep 35.7 cm Knee To Floor Left: Right: From Medial Instep 39.3 cm Vascular Assessment Pulses: Dorsalis Pedis Palpable: [Left:No] Electronic Signature(s) Signed: 01/05/2022 4:38:24 PM By: Adline Peals Entered By: Adline Peals on 01/05/2022 13:57:46 -------------------------------------------------------------------------------- Multi Wound Chart Details Patient Name: Date of Service: PA Shannon Score R. 01/05/2022 1:30 PM Medical Record Number: [id number] Patient Account Number: [id number] Date of Birth/Sex: Treating RN: 18-Oct-1940 (81 y.o. F) Primary Care Trumaine Wimer: Leeroy Cha Other Clinician: Referring Shaday Rayborn: Treating Essense Bousquet/Extender: Illene Silver, Rupashree Weeks in Treatment: 0 Vital Signs Height(in):  61 Pulse(bpm): 76 Weight(lbs): 145 Blood Pressure(mmHg): 160/62 Body Mass Index(BMI): 27.4 Temperature(F): 98.3 Respiratory Rate(breaths/min): 18 Wound Assessments Treatment Notes Electronic Signature(s) Signed: 01/05/2022 2:38:14 PM By: Fredirick Maudlin MD FACS Entered By: Fredirick Maudlin on 01/05/2022 14:38:14 -------------------------------------------------------------------------------- Pain Assessment Details Patient Name: Date of Service: PA Shannon Score R. 01/05/2022 1:30 PM Medical Record Number: [id number] Patient Account Number: [id number] Date of Birth/Sex: Treating RN: 1940-12-06 (81 y.o. Shannon Scott Primary Care Anyela Napierkowski: Other Clinician: Leeroy Cha Referring Jakaylah Schlafer: Treating Gilbert Narain/Extender: Arvella Merles Weeks in Treatment: 0 Active Problems Location of Pain Severity and Description of Pain Patient Has Paino Yes Site Locations Pain Location: Generalized Pain Duration of the Pain. Constant / Intermittento Intermittent Rate the pain. Current Pain Level: 8 Character of Pain Describe the Pain: Aching  Pain Management and Medication Current Pain Management: Medication: Yes Electronic Signature(s) Signed: 01/05/2022 4:38:24 PM By: Adline Peals Entered By: Adline Peals on 01/05/2022 13:58:14 -------------------------------------------------------------------------------- Patient/Caregiver Education Details Patient Name: Date of Service: PA Shannon Scott 8/8/2023andnbsp1:30 PM Medical Record Number: [id number] Patient Account Number: [id number] Date of Birth/Gender: Treating RN: May 25, 1941 (81 y.o. Shannon Scott Primary Care Physician: Leeroy Cha Other Clinician: Referring Physician: Treating Physician/Extender: Lexine Baton in Treatment: 0 Education Assessment Education Provided To: Patient Education Topics Provided Welcome T [place]: o Methods: Explain/Verbal Responses: Reinforcements needed, State content correctly Electronic Signature(s) Signed: 01/05/2022 4:38:24 PM By: Adline Peals Entered By: Adline Peals on 01/05/2022 14:06:57 -------------------------------------------------------------------------------- [place] Details Patient Name: Date of Service: PA Shannon Score R. 01/05/2022 1:30 PM Medical Record Number: [id number] Patient Account Number: [id number] Date of Birth/Sex: Treating RN: 1941-01-14 (81 y.o. Shannon Scott Primary Care Teodora Baumgarten: Leeroy Cha Other Clinician: Referring Estanislado Surgeon: Treating Dezzie Badilla/Extender: Arvella Merles Weeks in Treatment: 0 Vital Signs Time Taken: 13:46 Temperature (F): 98.3 Height (in): 61 Pulse (bpm): 76 Source: Stated Respiratory Rate (breaths/min): 18 Weight (lbs): 145 Blood Pressure (mmHg): 160/62 Source: Stated Reference Range: 80 - 120 mg / dl Body Mass Index (BMI): 27.4 Electronic Signature(s) Signed: 01/05/2022 4:38:24 PM By: Adline Peals Entered By: Adline Peals on 01/05/2022 13:48:23

## 2022-01-06 ENCOUNTER — Other Ambulatory Visit (HOSPITAL_BASED_OUTPATIENT_CLINIC_OR_DEPARTMENT_OTHER): Payer: Self-pay

## 2022-01-06 DIAGNOSIS — K56609 Unspecified intestinal obstruction, unspecified as to partial versus complete obstruction: Secondary | ICD-10-CM | POA: Diagnosis not present

## 2022-01-06 DIAGNOSIS — Z933 Colostomy status: Secondary | ICD-10-CM | POA: Diagnosis not present

## 2022-01-12 ENCOUNTER — Other Ambulatory Visit (HOSPITAL_BASED_OUTPATIENT_CLINIC_OR_DEPARTMENT_OTHER): Payer: Self-pay

## 2022-01-21 ENCOUNTER — Ambulatory Visit: Payer: Medicare Other | Admitting: Podiatry

## 2022-01-21 DIAGNOSIS — M79674 Pain in right toe(s): Secondary | ICD-10-CM | POA: Diagnosis not present

## 2022-01-21 DIAGNOSIS — M79675 Pain in left toe(s): Secondary | ICD-10-CM | POA: Diagnosis not present

## 2022-01-21 DIAGNOSIS — Z7901 Long term (current) use of anticoagulants: Secondary | ICD-10-CM | POA: Diagnosis not present

## 2022-01-21 DIAGNOSIS — B351 Tinea unguium: Secondary | ICD-10-CM | POA: Diagnosis not present

## 2022-01-21 MED ORDER — CICLOPIROX 8 % EX SOLN: Freq: Every day | CUTANEOUS | 2 refills | Status: DC

## 2022-01-24 NOTE — Progress Notes (Signed)
Subjective:   Patient ID: Shannon Scott, female   DOB: 81 y.o.   MRN: [id number]   HPI Chief Complaint  Patient presents with   Nail Problem    Routine foot care, nail trim, and bilateral nail fungus, Eliquis     81 year old female presents with above complaints.  Nails are thickened elongated she is unable to trim them herself.  She is on Eliquis.  No open lesions.   Review of Systems  All other systems reviewed and are negative.  Past Medical History:  Diagnosis Date   A-fib ([place]) 03/26/2020   Acute CVA (cerebrovascular accident) ([place]) 03/26/2020   Arthritis    Asthma    allergy induced   Bilateral carotid artery disease ([facility]CC)    L carotid bruit   Bursitis    left hip   CAD (coronary artery disease)    Cancer (HCC)    Dyslipidemia    intolerant to statins, welchol, niacin, zetia   Goals of care, counseling/discussion 10/11/2017   History of blood transfusion    History of nuclear stress test 04/24/2012   lexiscan; normal study   Hypertension    Hypothyroidism    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 ([place]) 10/11/2017   Postoperative nausea and vomiting 01/02/2016    Past Surgical History:  Procedure Laterality Date   ABDOMINAL HYSTERECTOMY     Carotid Doppler  02/2012   40-59% right int carotid artery stenosis; 60-79% L int carotid stenosis; L carotid bruit   CORONARY ARTERY BYPASS GRAFT  03/12/2004   LIMA to LAD, SVG to circumflex, SVG to PDA   CYSTOSCOPY W/ URETERAL STENT PLACEMENT Left 12/06/2017   Procedure: CYSTOSCOPY WITH LEFT URETERAL STENT EXCHANGE;  Surgeon: Irine Seal, MD;  Location: [facility];  Service: Urology;  Laterality: Left;   CYSTOSCOPY WITH STENT PLACEMENT Bilateral 08/21/2017   Procedure: CYSTOSCOPY WITH STENT PLACEMENT;  Surgeon: Ceasar Mons, MD;  Location: [facility];  Service: Urology;  Laterality: Bilateral;   IR FLUORO GUIDE PORT INSERTION RIGHT  10/11/2017   IR GENERIC HISTORICAL  04/29/2016    IR RADIOLOGIST EVAL & MGMT 04/29/2016 Sandi Mariscal, MD [facility]   IR GENERIC HISTORICAL  05/12/2016   IR RADIOLOGIST EVAL & MGMT 05/12/2016 Sandi Mariscal, MD [facility] RAD   IR IVC FILTER PLMT / S&I Burke Keels GUID/MOD SED  10/11/2017   IR US GUIDE VASC ACCESS RIGHT  10/11/2017   ROBOTIC ASSISTED TOTAL HYSTERECTOMY WITH BILATERAL SALPINGO OOPHERECTOMY Bilateral 12/30/2015   Procedure: XI ROBOTIC ASSISTED TOTAL HYSTERECTOMY WITH BILATERAL SALPINGO OOPHORECTOMY WITH SENTAL LYMPH NODE BIOPSY;  Surgeon: Nancy Marus, MD;  Location: [facility];  Service: Gynecology;  Laterality: Bilateral;   TONSILLECTOMY     TRANSTHORACIC ECHOCARDIOGRAM  04/2007   EF>55%; mild MR; mild-mod TR; mild pulm HTN; mild calcification of aortiv valve leaflets with mild valvular aortic stenosis   TUBAL LIGATION       Current Outpatient Medications:    ciclopirox (PENLAC) 8 % solution, Apply topically at bedtime. Apply over nail and surrounding skin. Apply daily over previous coat. After seven (7) days, may remove with alcohol and continue cycle., Disp: 6.6 mL, Rfl: 2   apixaban (ELIQUIS) 5 MG TABS tablet, Take 1 tablet (5 mg total) by mouth 2 (two) times daily., Disp: 60 tablet, Rfl: 0   clobetasol ointment (TEMOVATE) 3.82 %, Apply 1 application topically 3 (three) times a week., Disp: 30 g, Rfl: 0   COVID-19 mRNA bivalent vaccine, Pfizer, (PFIZER  COVID-19 VAC BIVALENT) injection, Inject into the muscle., Disp: 0.3 mL, Rfl: 0   diltiazem (TIAZAC) 300 MG 24 hr capsule, TAKE 1 CAPSULE(300 MG) BY MOUTH DAILY (Patient taking differently: Take 300 mg by mouth at bedtime.), Disp: 30 capsule, Rfl: 1   Evolocumab (REPATHA SURECLICK) 841 MG/ML SOAJ, INJECT 1 DOSE(140MG) UNDER THE SKIN EVERY 14 DAYS, Disp: 6 mL, Rfl: 3   ezetimibe (ZETIA) 10 MG tablet, Take 10 mg by mouth daily., Disp: , Rfl:    levothyroxine (SYNTHROID, LEVOTHROID) 50 MCG tablet, Take 1 tablet (50 mcg total) by mouth daily before breakfast., Disp: 30 tablet, Rfl: 1    mometasone (ELOCON) 0.1 % cream, Apply topically., Disp: , Rfl:    ondansetron (ZOFRAN-ODT) 8 MG disintegrating tablet, Take 8 mg by mouth as needed., Disp: , Rfl:    polyethylene glycol (MIRALAX / GLYCOLAX) 17 g packet, Take 17 g by mouth daily as needed for severe constipation., Disp: 14 each, Rfl: 0   traMADol (ULTRAM) 50 MG tablet, TAKE 2 TABLETS(100 MG) BY MOUTH TWICE DAILY, Disp: 120 tablet, Rfl: 0  Allergies  Allergen Reactions   Statins Other (See Comments)    Myalgias and memory problems   Ciprofloxacin Itching and Other (See Comments)    Splotchy redness with itching during IV infusion localized to arm. Other reaction(s): itching/swelling/redness at injection site          Objective:  Physical Exam  General: AAO x3, NAD  Dermatological: Nails are hypertrophic, dystrophic, brittle, discolored, elongated 10. No surrounding redness or drainage. Tenderness nails 1-5 bilaterally. No open lesions or pre-ulcerative lesions are identified today.   Vascular: Dorsalis Pedis artery and Posterior Tibial artery pedal pulses are 2/4 bilateral with immedate capillary fill time. There is no pain with calf compression, swelling, warmth, erythema.   Neruologic: Grossly intact via light touch bilateral.   Musculoskeletal: No other areas of discomfort.     Assessment:   Symptomatic onychomycosis, on Eliquis     Plan:  -Treatment options discussed including all alternatives, risks, and complications -Etiology of symptoms were discussed -Nails debrided 10 without complications or bleeding.  She also tried treatment options for nail fungus.  Discussed options.  We will start with topical Penlac. -Daily foot inspection -Follow-up in 3 months or sooner if any problems arise. In the meantime, encouraged to call the office with any questions, concerns, change in symptoms.   Celesta Gentile, DPM

## 2022-01-27 ENCOUNTER — Other Ambulatory Visit: Payer: Self-pay | Admitting: Hematology & Oncology

## 2022-01-27 DIAGNOSIS — M792 Neuralgia and neuritis, unspecified: Secondary | ICD-10-CM

## 2022-01-27 DIAGNOSIS — G8929 Other chronic pain: Secondary | ICD-10-CM

## 2022-01-27 DIAGNOSIS — C541 Malignant neoplasm of endometrium: Secondary | ICD-10-CM

## 2022-01-27 DIAGNOSIS — M545 Low back pain, unspecified: Secondary | ICD-10-CM

## 2022-01-28 ENCOUNTER — Encounter: Payer: Self-pay | Admitting: Family

## 2022-01-28 ENCOUNTER — Ambulatory Visit: Payer: Self-pay

## 2022-01-28 DIAGNOSIS — I890 Lymphedema, not elsewhere classified: Secondary | ICD-10-CM | POA: Diagnosis not present

## 2022-01-28 NOTE — Patient Outreach (Signed)
  Care Coordination   01/28/2022 Name: Shannon Scott MRN: [id number] DOB: 02-03-1941   Care Coordination Outreach Attempts:  An unsuccessful telephone outreach was attempted today to offer the patient information about available care coordination services as a benefit of their health plan.   Follow Up Plan:  Additional outreach attempts will be made to offer the patient care coordination information and services.   Encounter Outcome:  No Answer  Care Coordination Interventions Activated:  No   Care Coordination Interventions:  No, not indicated    [place] Management [phone]

## 2022-02-15 DIAGNOSIS — U071 COVID-19: Secondary | ICD-10-CM | POA: Diagnosis not present

## 2022-02-18 ENCOUNTER — Other Ambulatory Visit: Payer: Medicare Other

## 2022-02-18 ENCOUNTER — Ambulatory Visit: Payer: Medicare Other | Admitting: Hematology & Oncology

## 2022-02-18 ENCOUNTER — Telehealth: Payer: Self-pay

## 2022-02-18 ENCOUNTER — Inpatient Hospital Stay: Payer: Medicare Other

## 2022-02-18 NOTE — Patient Instructions (Signed)
Visit Information  Thank you for your time. It was great speaking with you today!  Following are the goals we discussed today:   Goals Addressed             This Visit's Progress    COMPLETED: Care Coordination Activities       Care Coordination Interventions: Reviewed plan for disease management. Reports adhering to plan and attending medical appointments as scheduled. Scheduled for follow up with the Cardiology team in December. Reviewed medications. Reports managing well. Denies concerns r/t medication management or prescription cost. Assessed social determinant of health barriers. AWV up to date. Completed on 12/16/21.       Shannon Scott verbalized understanding of the information discussed during the telephonic outreach. Declined need for mailed instructions or resources.  [name] will call for care coordination assistance if needed.  [place] Management [phone]

## 2022-02-18 NOTE — Patient Outreach (Signed)
  Care Coordination   Initial Visit Note   02/18/2022 Name: KAORI JUMPER MRN: [id number] DOB: Oct 05, 1940  FARON WHITELOCK is a 81 y.o. year old female who sees Leeroy Cha, MD for primary care. I spoke with  [name] by phone today.  What matters to the patients health and wellness today?  No Concerns Expressed    Goals Addressed             This Visit's Progress    COMPLETED: Care Coordination Activities       Care Coordination Interventions: Reviewed plan for disease management. Reports adhering to plan and attending medical appointments as scheduled. Scheduled for follow up with the Cardiology team in December. Reviewed medications. Reports managing well. Denies concerns r/t medication management or prescription cost. Assessed social determinant of health barriers. AWV up to date. Completed on 12/16/21.        SDOH assessments and interventions completed:  Yes  SDOH Interventions Today    Flowsheet Row Most Recent Value  SDOH Interventions   Food Insecurity Interventions Intervention Not Indicated  Transportation Interventions Intervention Not Indicated        Care Coordination Interventions Activated:  Yes  Care Coordination Interventions:  Yes, provided   Follow up plan: No further intervention required.   Encounter Outcome:  Pt. Visit Completed    [place] Management [phone]

## 2022-03-01 ENCOUNTER — Ambulatory Visit
Admission: RE | Admit: 2022-03-01 | Discharge: 2022-03-01 | Disposition: A | Discharge status: Home / Self Care | Payer: Medicare Other | Source: Ambulatory Visit | Attending: Internal Medicine | Admitting: Internal Medicine

## 2022-03-01 ENCOUNTER — Emergency Department (HOSPITAL_COMMUNITY): Payer: Medicare Other

## 2022-03-01 ENCOUNTER — Encounter (HOSPITAL_COMMUNITY): Payer: Self-pay

## 2022-03-01 ENCOUNTER — Observation Stay (HOSPITAL_COMMUNITY)
Admission: EM | Admit: 2022-03-01 | Discharge: 2022-03-02 | Disposition: A | Discharge status: Home / Self Care | Payer: Medicare Other | Attending: Family Medicine | Admitting: Family Medicine

## 2022-03-01 ENCOUNTER — Other Ambulatory Visit: Payer: Self-pay | Admitting: Internal Medicine

## 2022-03-01 ENCOUNTER — Other Ambulatory Visit: Payer: Self-pay

## 2022-03-01 DIAGNOSIS — Z952 Presence of prosthetic heart valve: Secondary | ICD-10-CM | POA: Diagnosis not present

## 2022-03-01 DIAGNOSIS — Z8541 Personal history of malignant neoplasm of cervix uteri: Secondary | ICD-10-CM | POA: Insufficient documentation

## 2022-03-01 DIAGNOSIS — R778 Other specified abnormalities of plasma proteins: Secondary | ICD-10-CM | POA: Insufficient documentation

## 2022-03-01 DIAGNOSIS — K56609 Unspecified intestinal obstruction, unspecified as to partial versus complete obstruction: Secondary | ICD-10-CM | POA: Diagnosis not present

## 2022-03-01 DIAGNOSIS — E039 Hypothyroidism, unspecified: Secondary | ICD-10-CM | POA: Diagnosis not present

## 2022-03-01 DIAGNOSIS — R600 Localized edema: Secondary | ICD-10-CM

## 2022-03-01 DIAGNOSIS — R079 Chest pain, unspecified: Secondary | ICD-10-CM | POA: Diagnosis present

## 2022-03-01 DIAGNOSIS — I4891 Unspecified atrial fibrillation: Secondary | ICD-10-CM | POA: Diagnosis not present

## 2022-03-01 DIAGNOSIS — I2510 Atherosclerotic heart disease of native coronary artery without angina pectoris: Secondary | ICD-10-CM | POA: Diagnosis not present

## 2022-03-01 DIAGNOSIS — R0781 Pleurodynia: Secondary | ICD-10-CM | POA: Diagnosis not present

## 2022-03-01 DIAGNOSIS — Z85038 Personal history of other malignant neoplasm of large intestine: Secondary | ICD-10-CM | POA: Insufficient documentation

## 2022-03-01 DIAGNOSIS — J9811 Atelectasis: Secondary | ICD-10-CM | POA: Diagnosis not present

## 2022-03-01 DIAGNOSIS — I10 Essential (primary) hypertension: Secondary | ICD-10-CM | POA: Diagnosis not present

## 2022-03-01 DIAGNOSIS — I25119 Atherosclerotic heart disease of native coronary artery with unspecified angina pectoris: Secondary | ICD-10-CM | POA: Diagnosis not present

## 2022-03-01 DIAGNOSIS — E785 Hyperlipidemia, unspecified: Secondary | ICD-10-CM

## 2022-03-01 DIAGNOSIS — Z79899 Other long term (current) drug therapy: Secondary | ICD-10-CM | POA: Insufficient documentation

## 2022-03-01 DIAGNOSIS — K449 Diaphragmatic hernia without obstruction or gangrene: Secondary | ICD-10-CM | POA: Diagnosis not present

## 2022-03-01 DIAGNOSIS — Z933 Colostomy status: Secondary | ICD-10-CM | POA: Diagnosis not present

## 2022-03-01 DIAGNOSIS — J45909 Unspecified asthma, uncomplicated: Secondary | ICD-10-CM | POA: Insufficient documentation

## 2022-03-01 DIAGNOSIS — J90 Pleural effusion, not elsewhere classified: Secondary | ICD-10-CM | POA: Diagnosis not present

## 2022-03-01 DIAGNOSIS — Z7901 Long term (current) use of anticoagulants: Secondary | ICD-10-CM | POA: Diagnosis not present

## 2022-03-01 DIAGNOSIS — I480 Paroxysmal atrial fibrillation: Secondary | ICD-10-CM | POA: Diagnosis not present

## 2022-03-01 DIAGNOSIS — Z8616 Personal history of COVID-19: Secondary | ICD-10-CM | POA: Insufficient documentation

## 2022-03-01 DIAGNOSIS — R071 Chest pain on breathing: Secondary | ICD-10-CM | POA: Diagnosis present

## 2022-03-01 DIAGNOSIS — Z8673 Personal history of transient ischemic attack (TIA), and cerebral infarction without residual deficits: Secondary | ICD-10-CM | POA: Diagnosis not present

## 2022-03-01 DIAGNOSIS — R0789 Other chest pain: Secondary | ICD-10-CM | POA: Diagnosis not present

## 2022-03-01 LAB — CBC WITH DIFFERENTIAL/PLATELET
Abs Immature Granulocytes: 0.03 10*3/uL (ref 0.00–0.07)
Basophils Absolute: 0 10*3/uL (ref 0.0–0.1)
Basophils Relative: 1 %
Eosinophils Absolute: 0 10*3/uL (ref 0.0–0.5)
Eosinophils Relative: 1 %
HCT: 30.1 % — ABNORMAL LOW (ref 36.0–46.0)
Hemoglobin: 9.3 g/dL — ABNORMAL LOW (ref 12.0–15.0)
Immature Granulocytes: 0 %
Lymphocytes Relative: 10 %
Lymphs Abs: 0.8 10*3/uL (ref 0.7–4.0)
MCH: 28.8 pg (ref 26.0–34.0)
MCHC: 30.9 g/dL (ref 30.0–36.0)
MCV: 93.2 fL (ref 80.0–100.0)
Monocytes Absolute: 0.6 10*3/uL (ref 0.1–1.0)
Monocytes Relative: 8 %
Neutro Abs: 6.4 10*3/uL (ref 1.7–7.7)
Neutrophils Relative %: 80 %
Platelets: 435 10*3/uL — ABNORMAL HIGH (ref 150–400)
RBC: 3.23 MIL/uL — ABNORMAL LOW (ref 3.87–5.11)
RDW: 13.5 % (ref 11.5–15.5)
WBC: 7.9 10*3/uL (ref 4.0–10.5)
nRBC: 0 % (ref 0.0–0.2)

## 2022-03-01 LAB — TROPONIN I (HIGH SENSITIVITY)
Troponin I (High Sensitivity): 164 ng/L (ref <18)
Troponin I (High Sensitivity): 156 ng/L (ref <18)

## 2022-03-01 LAB — COMPREHENSIVE METABOLIC PANEL
ALT: 14 U/L (ref 0–44)
AST: 20 U/L (ref 15–41)
Albumin: 2.9 g/dL — ABNORMAL LOW (ref 3.5–5.0)
Alkaline Phosphatase: 71 U/L (ref 38–126)
Anion gap: 10 (ref 5–15)
BUN: 7 mg/dL — ABNORMAL LOW (ref 8–23)
CO2: 25 mmol/L (ref 22–32)
Calcium: 9.3 mg/dL (ref 8.9–10.3)
Chloride: 105 mmol/L (ref 98–111)
Creatinine, Ser: 0.85 mg/dL (ref 0.44–1.00)
GFR, Estimated: >60 mL/min (ref >60)
Glucose, Bld: 126 mg/dL — ABNORMAL HIGH (ref 70–99)
Potassium: 4.3 mmol/L (ref 3.5–5.1)
Sodium: 140 mmol/L (ref 135–145)
Total Bilirubin: 0.3 mg/dL (ref 0.3–1.2)
Total Protein: 6.6 g/dL (ref 6.5–8.1)

## 2022-03-01 MED ORDER — POLYETHYLENE GLYCOL 3350 17 G PO PACK
17.0000 g | PACK | Freq: Every day | ORAL | Status: DC
  Filled 2022-03-01: qty 1

## 2022-03-01 MED ORDER — ASPIRIN 81 MG PO TBEC
81.0000 mg | DELAYED_RELEASE_TABLET | Freq: Every day | ORAL | Status: DC
  Filled 2022-03-01: qty 1

## 2022-03-01 MED ORDER — ONDANSETRON HCL 4 MG/2ML IJ SOLN: 4.0000 mg | Freq: Four times a day (QID) | INTRAMUSCULAR | Status: DC | PRN

## 2022-03-01 MED ORDER — TRAMADOL HCL 50 MG PO TABS
100.0000 mg | ORAL_TABLET | Freq: Two times a day (BID) | ORAL | Status: DC
  Filled 2022-03-01 (×2): qty 2

## 2022-03-01 MED ORDER — APIXABAN 5 MG PO TABS
5.0000 mg | ORAL_TABLET | Freq: Two times a day (BID) | ORAL | Status: DC
  Administered 2022-03-02: 5 mg via ORAL
  Filled 2022-03-01: qty 1

## 2022-03-01 MED ORDER — IOHEXOL 350 MG/ML SOLN
80.0000 mL | Freq: Once | INTRAVENOUS | Status: AC | PRN
  Administered 2022-03-01: 80 mL via INTRAVENOUS

## 2022-03-01 MED ORDER — LEVOTHYROXINE SODIUM 25 MCG PO TABS
50.0000 ug | ORAL_TABLET | Freq: Every day | ORAL | Status: DC
  Administered 2022-03-02: 50 ug via ORAL
  Filled 2022-03-01: qty 2

## 2022-03-01 MED ORDER — DILTIAZEM HCL ER COATED BEADS 300 MG PO CP24
300.0000 mg | ORAL_CAPSULE | Freq: Every day | ORAL | Status: DC
  Filled 2022-03-01: qty 1

## 2022-03-01 MED ORDER — SODIUM CHLORIDE 0.9 % IV SOLN: INTRAVENOUS | Status: DC

## 2022-03-01 MED ORDER — EZETIMIBE 10 MG PO TABS
10.0000 mg | ORAL_TABLET | Freq: Every day | ORAL | Status: DC
  Filled 2022-03-01: qty 1

## 2022-03-01 MED ORDER — ACETAMINOPHEN 325 MG PO TABS: 650.0000 mg | ORAL_TABLET | ORAL | Status: DC | PRN

## 2022-03-01 MED ORDER — MORPHINE SULFATE (PF) 2 MG/ML IV SOLN: 2.0000 mg | INTRAVENOUS | Status: DC | PRN

## 2022-03-01 MED ORDER — NITROGLYCERIN 0.4 MG SL SUBL: 0.4000 mg | SUBLINGUAL_TABLET | SUBLINGUAL | Status: DC | PRN

## 2022-03-01 NOTE — ED Provider Triage Note (Signed)
Emergency Medicine Provider Triage Evaluation Note  Shannon Scott , a 81 y.o. female  was evaluated in triage.  Pt complains of chest pain.  Patient reports left-sided chest pain that has been present for the past 2 to 3 days.  She states chest pain is worsened with taking a deep breath.  She denies any associated shortness of breath.  Denies history of DVT/PE but does note that she has had some increased swelling in her right lower extremity.  Baseline lymphedema in left lower extremity.  Denies fever, chills, night sweats, abdominal pain, nausea, vomiting.  She was seen by her PCP earlier today with an elevated troponin of 60s  Review of Systems  Positive: See above Negative:   Physical Exam  BP (!) 165/58 (BP Location: Right Arm)   Pulse 94   Temp 99.3 F (37.4 C) (Oral)   Resp (!) 22   Ht 5' 1"  (1.549 m)   Wt 67.1 kg   SpO2 99%   BMI 27.96 kg/m  Gen:   Awake, no distress   Resp:  Normal effort  MSK:   Moves extremities without difficulty  Other:  Systolic right upper sternal border murmur.  Bilateral lower extremity swelling with left greater than right.  Patient states that right is greater than what it normally is at baseline.  Medical Decision Making  Medically screening exam initiated at 5:36 PM.  Appropriate orders placed.  EMILY MASSAR was informed that the remainder of the evaluation will be completed by another provider, this initial triage assessment does not replace that evaluation, and the importance of remaining in the ED until their evaluation is complete.  See above   Wilnette Kales, [place] 03/01/22 [phone]

## 2022-03-01 NOTE — ED Notes (Signed)
Pt ambulated to bathroom with assistance from family

## 2022-03-01 NOTE — H&P (Signed)
[place]   PATIENT NAME: Shannon Scott    MR#:  [id number]  DATE OF BIRTH:  24-May-1941  DATE OF ADMISSION:  03/01/2022  PRIMARY CARE PHYSICIAN: Leeroy Cha, MD   Patient is coming from: Home  REQUESTING/REFERRING PHYSICIAN: Nogle, [place], MD   CHIEF COMPLAINT:   Chief Complaint  Patient presents with   Chest Pain    HISTORY OF PRESENT ILLNESS:  Shannon Scott is a 81 y.o. Caucasian female with medical history significant for atrial fibrillation, CVA, asthma, osteoarthritis, coronary artery disease, dyslipidemia, hypertension and hypothyroidism, who presented to the emergency room with acute onset of midsternal chest pain with deep breathing which has been intermittent over the last 4 days.  She denied any associated radiation or nausea or vomiting or diaphoresis.  No cough or wheezing or hemoptysis.  No fever or chills.  She has chronic left leg swelling with lymphedema and mild erythema and induration without pain.  No fever or chills.  No diarrhea or abdominal pain or melena or bright red bleeding per rectum.  No other bleeding diathesis.  She has compliant with her Eliquis for her atrial fibrillation . ED Course: When she came to the ER, temp was 99.3, BP was 165/58 with a respiratory rate of 22 with otherwise normal vital signs.  Labs revealed unremarkable CMP except for albumin of 2.9.  High sensitive troponin I was 764 and later 156.  CBC showed anemia slightly worse than previous levels. EKG as reviewed by me :  EKG showed normal sinus rhythm with a rate of 88 with nonspecific T wave inversion laterally and inferiorly Imaging: Portable chest x-ray showed stable trace left pleural effusion and minimal left lower lobe subsegmental atelectasis. Chest CTA revealed the following: 1. No evidence of pulmonary embolism. 2. Mild left basilar atelectasis. 3. Very small left pleural effusion. 4. Small hiatal hernia. 5. Mild cardiomegaly with marked severity  coronary artery calcification. 6. Aortic atherosclerosis.  The patient will be admitted to an observation cardiac telemetry bed for further evaluation and management. PAST MEDICAL HISTORY:   Past Medical History:  Diagnosis Date   A-fib ([place]) 03/26/2020   Acute CVA (cerebrovascular accident) ([place]) 03/26/2020   Arthritis    Asthma    allergy induced   Bilateral carotid artery disease (HCC)    L carotid bruit   Bursitis    left hip   CAD (coronary artery disease)    Cancer ([place])    Dyslipidemia    intolerant to statins, welchol, niacin, zetia   Goals of care, counseling/discussion 10/11/2017   History of blood transfusion    History of nuclear stress test 04/24/2012   lexiscan; normal study   Hypertension    Hypothyroidism    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 ([place]) 10/11/2017   Postoperative nausea and vomiting 01/02/2016    PAST SURGICAL HISTORY:   Past Surgical History:  Procedure Laterality Date   ABDOMINAL HYSTERECTOMY     Carotid Doppler  02/2012   40-59% right int carotid artery stenosis; 60-79% L int carotid stenosis; L carotid bruit   CORONARY ARTERY BYPASS GRAFT  03/12/2004   LIMA to LAD, SVG to circumflex, SVG to PDA   CYSTOSCOPY W/ URETERAL STENT PLACEMENT Left 12/06/2017   Procedure: CYSTOSCOPY WITH LEFT URETERAL STENT EXCHANGE;  Surgeon: Irine Seal, MD;  Location: [facility];  Service: Urology;  Laterality: Left;   CYSTOSCOPY WITH STENT PLACEMENT Bilateral 08/21/2017   Procedure: CYSTOSCOPY WITH STENT PLACEMENT;  Surgeon: Ceasar Mons, MD;  Location: [facility];  Service: Urology;  Laterality: Bilateral;   IR FLUORO GUIDE PORT INSERTION RIGHT  10/11/2017   IR GENERIC HISTORICAL  04/29/2016   IR RADIOLOGIST EVAL & MGMT 04/29/2016 Sandi Mariscal, MD [facility]   IR GENERIC HISTORICAL  05/12/2016   IR RADIOLOGIST EVAL & MGMT 05/12/2016 Sandi Mariscal, MD [facility]   IR IVC FILTER PLMT / S&I Burke Keels GUID/MOD SED   10/11/2017   IR US GUIDE VASC ACCESS RIGHT  10/11/2017   ROBOTIC ASSISTED TOTAL HYSTERECTOMY WITH BILATERAL SALPINGO OOPHERECTOMY Bilateral 12/30/2015   Procedure: XI ROBOTIC ASSISTED TOTAL HYSTERECTOMY WITH BILATERAL SALPINGO OOPHORECTOMY WITH SENTAL LYMPH NODE BIOPSY;  Surgeon: Nancy Marus, MD;  Location: [facility];  Service: Gynecology;  Laterality: Bilateral;   TONSILLECTOMY     TRANSTHORACIC ECHOCARDIOGRAM  04/2007   EF>55%; mild MR; mild-mod TR; mild pulm HTN; mild calcification of aortiv valve leaflets with mild valvular aortic stenosis   TUBAL LIGATION      SOCIAL HISTORY:   Social History   Tobacco Use   Smoking status: Never   Smokeless tobacco: Never  Substance Use Topics   Alcohol use: No    FAMILY HISTORY:   Family History  Problem Relation Age of Onset   Hypertension Mother    Heart disease Mother        Died in her 97s   Stroke Father    Kidney disease Brother    Heart disease Brother        also HTN, hyperlipidemia   Heart attack Brother    Stroke Sister        x2   Hypertension Sister     DRUG ALLERGIES:   Allergies  Allergen Reactions   Statins Other (See Comments)    Myalgias and memory problems   Ciprofloxacin Itching and Other (See Comments)    Splotchy redness with itching during IV infusion localized to arm. Other reaction(s): itching/swelling/redness at injection site    REVIEW OF SYSTEMS:   ROS As per history of present illness. All pertinent systems were reviewed above. Constitutional, HEENT, cardiovascular, respiratory, GI, GU, musculoskeletal, neuro, psychiatric, endocrine, integumentary and hematologic systems were reviewed and are otherwise negative/unremarkable except for positive findings mentioned above in the HPI.   MEDICATIONS AT HOME:   Prior to Admission medications   Medication Sig Start Date End Date Taking? Authorizing Provider  amoxicillin (AMOXIL) 500 MG capsule Take 500 mg by mouth as needed (for dentist appointments).  02/08/22  Yes [provider]  apixaban (ELIQUIS) 5 MG TABS tablet Take 1 tablet (5 mg total) by mouth 2 (two) times daily. 03/29/20  Yes Amin, Ankit Chirag, MD  ciclopirox (PENLAC) 8 % solution Apply topically at bedtime. Apply over nail and surrounding skin. Apply daily over previous coat. After seven (7) days, may remove with alcohol and continue cycle. Patient taking differently: Apply 1 application  topically See admin instructions. Apply over nail and surrounding skin as needed. Apply daily over previous coat. After seven (7) days, may remove with alcohol and continue cycle. 01/21/22  Yes Trula Slade, DPM  clobetasol ointment (TEMOVATE) 8.30 % Apply 1 application topically 3 (three) times a week. Patient taking differently: Apply 1 application  topically as needed (for flares). 04/23/20  Yes Lafonda Mosses, MD  diltiazem (TIAZAC) 300 MG 24 hr capsule TAKE 1 CAPSULE(300 MG) BY MOUTH DAILY Patient taking differently: Take 300 mg by mouth at bedtime. 06/13/18  Yes Burney Gauze  R, MD  Evolocumab (REPATHA SURECLICK) 643 MG/ML SOAJ INJECT 1 DOSE(140MG) UNDER THE SKIN EVERY 14 DAYS Patient taking differently: Inject 140 mg into the skin every 14 (fourteen) days. 05/27/21  Yes Hilty, Nadean Corwin, MD  ezetimibe (ZETIA) 10 MG tablet Take 10 mg by mouth daily. 06/05/21  Yes [provider]  levothyroxine (SYNTHROID, LEVOTHROID) 50 MCG tablet Take 1 tablet (50 mcg total) by mouth daily before breakfast. 10/25/17  Yes Tanner, Lyndon Code., PA-C  mometasone (ELOCON) 0.1 % cream Apply 1 Application topically as needed (for flares). 10/05/21  Yes [provider]  polyethylene glycol (MIRALAX / GLYCOLAX) 17 g packet Take 17 g by mouth daily as needed for severe constipation. Patient taking differently: Take 17 g by mouth daily. 03/29/20  Yes Amin, Ankit Chirag, MD  traMADol (ULTRAM) 50 MG tablet TAKE 2 TABLETS(100 MG) BY MOUTH TWICE DAILY Patient taking differently: Take 100 mg by mouth  2 (two) times daily. 01/28/22  Yes Volanda Napoleon, MD  COVID-19 mRNA bivalent vaccine, Pfizer, (PFIZER COVID-19 VAC BIVALENT) injection Inject into the muscle. 01/01/22   Carlyle Basques, MD  LAGEVRIO 200 MG CAPS capsule SMARTSIG:4 Capsule(s) By Mouth Every 12 Hours Patient not taking: Reported on 03/01/2022 02/15/22   [provider]      VITAL SIGNS:  Blood pressure (!) 146/54, pulse 68, temperature 98.9 F (37.2 C), resp. rate 20, height 5' 1"  (1.549 m), weight 67.1 kg, SpO2 96 %.  PHYSICAL EXAMINATION:  Physical Exam  GENERAL:  81 y.o.-year-old patient female patient lying in the bed with no acute distress.  EYES: Pupils equal, round, reactive to light and accommodation. No scleral icterus. Extraocular muscles intact.  HEENT: Head atraumatic, normocephalic. Oropharynx and nasopharynx clear.  NECK:  Supple, no jugular venous distention. No thyroid enlargement, no tenderness.  LUNGS: Normal breath sounds bilaterally, no wheezing, rales,rhonchi or crepitation. No use of accessory muscles of respiration.  CARDIOVASCULAR: Regular rate and rhythm, S1, S2 normal. No murmurs, rubs, or gallops.  ABDOMEN: Soft, nondistended, nontender. Bowel sounds present. No organomegaly or mass.  EXTREMITIES: She has left 3-4+ soft leg mildly pitting edema with no cyanosis, or clubbing.  NEUROLOGIC: Cranial nerves II through XII are intact. Muscle strength 5/5 in all extremities. Sensation intact. Gait not checked.  PSYCHIATRIC: The patient is alert and oriented x 3.  Normal affect and good eye contact. SKIN: Erythematous and indurated left lateral leg with generalized swelling.    LABORATORY PANEL:   CBC Recent Labs  Lab 03/01/22 1743  WBC 7.9  HGB 9.3*  HCT 30.1*  PLT 435*   ------------------------------------------------------------------------------------------------------------------  Chemistries  Recent Labs  Lab 03/01/22 1743  NA 140  K 4.3  CL 105  CO2 25  GLUCOSE 126*   BUN 7*  CREATININE 0.85  CALCIUM 9.3  AST 20  ALT 14  ALKPHOS 71  BILITOT 0.3  Troponin I was 164 and later 156 ------------------------------------------------------------------------------------------------------------------  Cardiac Enzymes No results for input(s): "TROPONINI" in the last 168 hours. ------------------------------------------------------------------------------------------------------------------  RADIOLOGY:  CT Angio Chest PE W and/or Wo Contrast  Result Date: 03/01/2022 CLINICAL DATA:  Chest pain upon deep inspiration. EXAM: CT ANGIOGRAPHY CHEST WITH CONTRAST TECHNIQUE: Multidetector CT imaging of the chest was performed using the standard protocol during bolus administration of intravenous contrast. Multiplanar CT image reconstructions and MIPs were obtained to evaluate the vascular anatomy. RADIATION DOSE REDUCTION: This exam was performed according to the departmental dose-optimization program which includes automated exposure control, adjustment of the mA and/or kV  according to patient size and/or use of iterative reconstruction technique. CONTRAST:  45m OMNIPAQUE IOHEXOL 350 MG/ML SOLN COMPARISON:  December 18, 2021 FINDINGS: Cardiovascular: Stable right-sided venous Port-A-Cath positioning is seen. There is marked severity calcification of the thoracic aorta, without evidence of aortic aneurysm. Satisfactory opacification of the pulmonary arteries to the segmental level. No evidence of pulmonary embolism. There is mild cardiomegaly with marked severity coronary artery calcification. Marked severity calcification of the mitral annulus is also seen. No pericardial effusion. Mediastinum/Nodes: No enlarged mediastinal, hilar, or axillary lymph nodes. A stable, benign, subcentimeter nodule is seen within the right lobe of the thyroid gland. The trachea and esophagus demonstrate no significant findings. Lungs/Pleura: Mild left basilar atelectasis is seen. A very small left  pleural effusion is noted. No pneumothorax is identified. Upper Abdomen: There is a small hiatal hernia. Musculoskeletal: Multiple sternal wires are noted. Degenerative changes seen throughout the thoracic spine. Review of the MIP images confirms the above findings. IMPRESSION: 1. No evidence of pulmonary embolism. 2. Mild left basilar atelectasis. 3. Very small left pleural effusion. 4. Small hiatal hernia. 5. Mild cardiomegaly with marked severity coronary artery calcification. 6. Aortic atherosclerosis. Aortic Atherosclerosis (ICD10-I70.0). Electronically Signed   By: TVirgina NorfolkM.D.   On: 03/01/2022 23:33   DG Chest Port 1 View  Result Date: 03/01/2022 CLINICAL DATA:  Pain with inspiration, chest pain EXAM: PORTABLE CHEST 1 VIEW COMPARISON:  03/01/2022 at 2:39 p.m. FINDINGS: Single frontal view of the chest demonstrates a stable right chest wall port. Postsurgical changes from CABG. The cardiac silhouette is unremarkable. Trace left pleural effusion unchanged. Likely subsegmental atelectasis left lower lobe. No airspace disease or pneumothorax. IMPRESSION: 1. Stable trace left pleural effusion. 2. Minimal left lower lobe subsegmental atelectasis. Electronically Signed   By: MRanda NgoM.D.   On: 03/01/2022 20:05   DG Chest 2 View  Result Date: 03/01/2022 CLINICAL DATA:  Chest pain. EXAM: CHEST - 2 VIEW COMPARISON:  December 18, 2020. FINDINGS: Status post coronary bypass graft. Stable cardiomediastinal silhouette. Right internal jugular Port-A-Cath is unchanged. Small left pleural effusion is noted. No pneumothorax is noted. Bony thorax is unremarkable. IMPRESSION: Small left pleural effusion. Electronically Signed   By: JMarijo ConceptionM.D.   On: 03/01/2022 17:41      IMPRESSION AND PLAN:  Assessment and Plan: * Chest pain - The patient will be admitted to a cardiac telemetry observation bed. -We will follow serial troponins. - Her troponins are currently elevated without significant  beta.  Her chest CTA was negative for PE. - She will be placed on aspirin as well as as needed sublingual nitroglycerin and morphine sulfate. -Cardiology consult will be obtained. - I notified PGay Fillerabout the patient.  Edema of left lower extremity - This associated with mild erythema and induration. - It could be her chronic lymphedema. - We will obtain venous Doppler to rule out DVT with the possibility of Eliquis failure.  Paroxysmal atrial fibrillation (HCC) - We will continue Eliquis and Cardizem CD.  Hypothyroidism - We will continue Synthroid  Dyslipidemia - We will continue Zetia.  The patient takes Repatha injections.    DVT prophylaxis: Eliquis.  Advanced Care Planning:  Code Status: full code Family Communication:  The plan of care was discussed in details with the patient (and family). I answered all questions. The patient agreed to proceed with the above mentioned plan. Further management will depend upon hospital course. Disposition Plan: Back to previous home environment Consults called: Cardiology  All the records are reviewed and case discussed with ED provider.  Status: Observation  I certify that at the time of admission, it is my clinical judgment that the patient will require hospital care extending last than 2 midnights.                            Dispo: The patient is from: Home              Anticipated d/c is to: Home              Patient currently is not medically stable to d/c.              Difficult to place patient: No  Christel Mormon M.D on 03/02/2022 at 1:20 AM  [facility]   From 7 PM-7 AM, contact night-coverage [email or web address]  CC: Primary care physician; Leeroy Cha, MD

## 2022-03-01 NOTE — ED Notes (Signed)
Pt reports L side chest pain when taking a deep breath. EDP at bedside

## 2022-03-01 NOTE — ED Triage Notes (Signed)
Sent by PCP bc she was seen for pain when she takes a deep breath and PCP did blood work and called and told her to come to ER bc he troponin was in the 60's.

## 2022-03-01 NOTE — ED Notes (Signed)
Pt ambulatory to BR with minimal assistance.

## 2022-03-01 NOTE — ED Notes (Signed)
Patient to room after scan

## 2022-03-01 NOTE — ED Provider Notes (Signed)
[facility] EMERGENCY DEPARTMENT Provider Note   CSN: [id number] Arrival date & time: 03/01/22  1701     History  Chief Complaint  Patient presents with   Chest Pain    Shannon Scott is a 81 y.o. female.   Chest Pain  Per the patient, the patient has had intermittent episodes of chest pain as well as associated shortness of breath for the past 4 days.  Patient endorses that her chest pain is worse with deep inspiration.  She endorses that she has a history of lower extremity blood clots.  She also has a history of left lower extremity edema secondary to radiation therapy for uterine cancer.  Patient denies any cough, congestion, hemoptysis, sputum production, abdominal pain, back pain, dizziness.  Patient was reportedly seen by her PCP today who did laboratory work-up revealed that her troponin was 60, at that point time she referred to the emergency department for evaluation.     Home Medications Prior to Admission medications   Medication Sig Start Date End Date Taking? Authorizing Provider  amoxicillin (AMOXIL) 500 MG capsule Take 500 mg by mouth as needed (for dentist appointments). 02/08/22  Yes [provider]  apixaban (ELIQUIS) 5 MG TABS tablet Take 1 tablet (5 mg total) by mouth 2 (two) times daily. 03/29/20  Yes Amin, Ankit Chirag, MD  ciclopirox (PENLAC) 8 % solution Apply topically at bedtime. Apply over nail and surrounding skin. Apply daily over previous coat. After seven (7) days, may remove with alcohol and continue cycle. Patient taking differently: Apply 1 application  topically See admin instructions. Apply over nail and surrounding skin as needed. Apply daily over previous coat. After seven (7) days, may remove with alcohol and continue cycle. 01/21/22  Yes Trula Slade, DPM  clobetasol ointment (TEMOVATE) 2.26 % Apply 1 application topically 3 (three) times a week. Patient taking differently: Apply 1 application  topically as  needed (for flares). 04/23/20  Yes Lafonda Mosses, MD  diltiazem (TIAZAC) 300 MG 24 hr capsule TAKE 1 CAPSULE(300 MG) BY MOUTH DAILY Patient taking differently: Take 300 mg by mouth at bedtime. 06/13/18  Yes Ennever, Rudell Cobb, MD  Evolocumab (REPATHA SURECLICK) 333 MG/ML SOAJ INJECT 1 DOSE(140MG) UNDER THE SKIN EVERY 14 DAYS Patient taking differently: Inject 140 mg into the skin every 14 (fourteen) days. 05/27/21  Yes Hilty, Nadean Corwin, MD  ezetimibe (ZETIA) 10 MG tablet Take 10 mg by mouth daily. 06/05/21  Yes [provider]  levothyroxine (SYNTHROID, LEVOTHROID) 50 MCG tablet Take 1 tablet (50 mcg total) by mouth daily before breakfast. 10/25/17  Yes Tanner, Lyndon Code., PA-C  mometasone (ELOCON) 0.1 % cream Apply 1 Application topically as needed (for flares). 10/05/21  Yes [provider]  polyethylene glycol (MIRALAX / GLYCOLAX) 17 g packet Take 17 g by mouth daily as needed for severe constipation. Patient taking differently: Take 17 g by mouth daily. 03/29/20  Yes Amin, Ankit Chirag, MD  traMADol (ULTRAM) 50 MG tablet TAKE 2 TABLETS(100 MG) BY MOUTH TWICE DAILY Patient taking differently: Take 100 mg by mouth 2 (two) times daily. 01/28/22  Yes Volanda Napoleon, MD  COVID-19 mRNA bivalent vaccine, Pfizer, (PFIZER COVID-19 VAC BIVALENT) injection Inject into the muscle. 01/01/22   Carlyle Basques, MD  LAGEVRIO 200 MG CAPS capsule SMARTSIG:4 Capsule(s) By Mouth Every 12 Hours Patient not taking: Reported on 03/01/2022 02/15/22   [provider]      Allergies    Statins and Ciprofloxacin  Review of Systems   Review of Systems  Cardiovascular:  Positive for chest pain.    Physical Exam Updated Vital Signs BP (!) 146/54   Pulse 68   Temp 98.9 F (37.2 C)   Resp 20   Ht 5' 1"  (1.549 m)   Wt 67.1 kg   SpO2 96%   BMI 27.96 kg/m  Physical Exam Vitals and nursing note reviewed.  Constitutional:      General: She is not in acute distress.    Appearance: She is  well-developed.     Comments: Chronically ill-appearing female  HENT:     Head: Normocephalic and atraumatic.  Eyes:     Conjunctiva/sclera: Conjunctivae normal.  Cardiovascular:     Rate and Rhythm: Normal rate and regular rhythm.     Heart sounds: No murmur heard. Pulmonary:     Effort: Pulmonary effort is normal. No respiratory distress.     Breath sounds: Normal breath sounds.  Chest:     Chest wall: Tenderness present.     Comments: Tenderness of the left chest wall Abdominal:     Palpations: Abdomen is soft.     Tenderness: There is no abdominal tenderness.     Comments: Ostomy bag in place, no tenderness to palpation  Musculoskeletal:        General: No swelling.     Cervical back: Neck supple.     Right lower leg: Edema present.     Left lower leg: Tenderness present. Edema present.     Comments: Bilateral lower extremity edema, 2+ on the right, 3+ on the left, erythema noted on the left calf and anterior shin.  Mild tenderness to palpation   Skin:    General: Skin is warm and dry.     Capillary Refill: Capillary refill takes less than 2 seconds.  Neurological:     Mental Status: She is alert.  Psychiatric:        Mood and Affect: Mood normal.     ED Results / Procedures / Treatments   Labs (all labs ordered are listed, but only abnormal results are displayed) Labs Reviewed  COMPREHENSIVE METABOLIC PANEL - Abnormal; Notable for the following components:      Result Value   Glucose, Bld 126 (*)    BUN 7 (*)    Albumin 2.9 (*)    All other components within normal limits  CBC WITH DIFFERENTIAL/PLATELET - Abnormal; Notable for the following components:   RBC 3.23 (*)    Hemoglobin 9.3 (*)    HCT 30.1 (*)    Platelets 435 (*)    All other components within normal limits  TROPONIN I (HIGH SENSITIVITY) - Abnormal; Notable for the following components:   Troponin I (High Sensitivity) 164 (*)    All other components within normal limits  TROPONIN I (HIGH  SENSITIVITY) - Abnormal; Notable for the following components:   Troponin I (High Sensitivity) 156 (*)    All other components within normal limits  LIPID PANEL    EKG EKG Interpretation  Date/Time:  Monday March 01 2022 16:55:46 EDT Ventricular Rate:  88 PR Interval:  152 QRS Duration: 86 QT Interval:  370 QTC Calculation: 447 R Axis:   29 Text Interpretation: Normal sinus rhythm Non-specific ST-t changes Confirmed by Lajean Saver [phone]) on 03/01/2022 7:36:32 PM  Radiology CT Angio Chest PE W and/or Wo Contrast  Result Date: 03/01/2022 CLINICAL DATA:  Chest pain upon deep inspiration. EXAM: CT ANGIOGRAPHY CHEST WITH CONTRAST TECHNIQUE: Multidetector CT  imaging of the chest was performed using the standard protocol during bolus administration of intravenous contrast. Multiplanar CT image reconstructions and MIPs were obtained to evaluate the vascular anatomy. RADIATION DOSE REDUCTION: This exam was performed according to the departmental dose-optimization program which includes automated exposure control, adjustment of the mA and/or kV according to patient size and/or use of iterative reconstruction technique. CONTRAST:  14m OMNIPAQUE IOHEXOL 350 MG/ML SOLN COMPARISON:  December 18, 2021 FINDINGS: Cardiovascular: Stable right-sided venous Port-A-Cath positioning is seen. There is marked severity calcification of the thoracic aorta, without evidence of aortic aneurysm. Satisfactory opacification of the pulmonary arteries to the segmental level. No evidence of pulmonary embolism. There is mild cardiomegaly with marked severity coronary artery calcification. Marked severity calcification of the mitral annulus is also seen. No pericardial effusion. Mediastinum/Nodes: No enlarged mediastinal, hilar, or axillary lymph nodes. A stable, benign, subcentimeter nodule is seen within the right lobe of the thyroid gland. The trachea and esophagus demonstrate no significant findings. Lungs/Pleura: Mild left  basilar atelectasis is seen. A very small left pleural effusion is noted. No pneumothorax is identified. Upper Abdomen: There is a small hiatal hernia. Musculoskeletal: Multiple sternal wires are noted. Degenerative changes seen throughout the thoracic spine. Review of the MIP images confirms the above findings. IMPRESSION: 1. No evidence of pulmonary embolism. 2. Mild left basilar atelectasis. 3. Very small left pleural effusion. 4. Small hiatal hernia. 5. Mild cardiomegaly with marked severity coronary artery calcification. 6. Aortic atherosclerosis. Aortic Atherosclerosis (ICD10-I70.0). Electronically Signed   By: TVirgina NorfolkM.D.   On: 03/01/2022 23:33   DG Chest Port 1 View  Result Date: 03/01/2022 CLINICAL DATA:  Pain with inspiration, chest pain EXAM: PORTABLE CHEST 1 VIEW COMPARISON:  03/01/2022 at 2:39 p.m. FINDINGS: Single frontal view of the chest demonstrates a stable right chest wall port. Postsurgical changes from CABG. The cardiac silhouette is unremarkable. Trace left pleural effusion unchanged. Likely subsegmental atelectasis left lower lobe. No airspace disease or pneumothorax. IMPRESSION: 1. Stable trace left pleural effusion. 2. Minimal left lower lobe subsegmental atelectasis. Electronically Signed   By: MRanda NgoM.D.   On: 03/01/2022 20:05   DG Chest 2 View  Result Date: 03/01/2022 CLINICAL DATA:  Chest pain. EXAM: CHEST - 2 VIEW COMPARISON:  December 18, 2020. FINDINGS: Status post coronary bypass graft. Stable cardiomediastinal silhouette. Right internal jugular Port-A-Cath is unchanged. Small left pleural effusion is noted. No pneumothorax is noted. Bony thorax is unremarkable. IMPRESSION: Small left pleural effusion. Electronically Signed   By: JMarijo ConceptionM.D.   On: 03/01/2022 17:41    Procedures Procedures    Medications Ordered in ED Medications  traMADol (ULTRAM) tablet 100 mg (has no administration in time range)  diltiazem (CARDIZEM CD) 24 hr capsule 300  mg (has no administration in time range)  ezetimibe (ZETIA) tablet 10 mg (has no administration in time range)  levothyroxine (SYNTHROID) tablet 50 mcg (has no administration in time range)  polyethylene glycol (MIRALAX / GLYCOLAX) packet 17 g (has no administration in time range)  apixaban (ELIQUIS) tablet 5 mg (has no administration in time range)  acetaminophen (TYLENOL) tablet 650 mg (has no administration in time range)  ondansetron (ZOFRAN) injection 4 mg (has no administration in time range)  0.9 %  sodium chloride infusion (has no administration in time range)  aspirin EC tablet 81 mg (has no administration in time range)  morphine (PF) 2 MG/ML injection 2 mg (has no administration in time range)  nitroGLYCERIN (NITROSTAT) SL  tablet 0.4 mg (has no administration in time range)  iohexol (OMNIPAQUE) 350 MG/ML injection 80 mL (80 mLs Intravenous Contrast Given 03/01/22 2323)    ED Course/ Medical Decision Making/ A&P                          Medical Decision Making Amount and/or Complexity of Data Reviewed Labs:  Decision-making details documented in ED Course. Radiology: ordered. Decision-making details documented in ED Course. ECG/medicine tests:  Decision-making details documented in ED Course.  Risk Prescription drug management. Decision regarding hospitalization.   Medical Decision Making  This patient is Presenting for Evaluation of chest pain, patient has a past medical history CABG, uterine cancer, DVT on Eliquis, A-fib which complicates their presentation.  Of which does require a range of treatment options, and is a complaint that involves a high risk of morbidity and mortality.  Arrived in ED by: POV  History obtained from: The patient, patient's son Limitations in history: none    At this time I am most concerned for STEMI. Also considering NSTEMI, ACS, unstable angina, pulmonary embolism, acute heart failure exacerbation, metabolic abnormality plan for laboratory  and imaging study work-up   I interpreted the ECG. It reveals a sinus rhythm. The QTc, PR, and QRS are appropriate. There are no signs of acute ischemia or of significant electrical abnormalities. The ECG does not show a STEMI.  Nonspecific ST changes, similar to priors. there are no T wave inversions. There is no evidence of a High-Grade Conduction Block..  Similar to prior studies   Laboratory work-up significant for:  -Patient's laboratory work-up revealed anemia with a hemoglobin 9.3, one-point lower than her baseline approximately month ago.  No additional significant metabolic abnormality.  CMP reassuring, no significant metabolic abnormality.  Initial troponin 164, repeat troponin 156  Radiologic work-up was significant for: -Chest x-ray with trace left-sided pleural effusion, no significant evidence of heart failure pulmonary edema. -CT PE study pending at time of admission   Interventions and Interval History:  -Patient remained hemodynamically stable during her time in the emergency department.  She is having intermittent episodes of chest pain that is primarily just with inspiration.  Patient elevated troponin however it appears to have peaked.  Patient has EKG changes concerning for that for acute ischemia at this time.  She had no new oxygen requirement and was vitally stable.  Patient's imaging study work-up was reassuring however due to the patient's history of DVT, and the pleuritic nature of her chest pain, CT PE study was felt to be clinically indicated at this time.    Additional documents reviewed: Outside hospital records    Disposition: Due to the patients current presenting symptoms, physical exam findings, and the workup stated above, it is thought that the etiology of the patients current presentation is NSTEMI   ADMIT: Patient is thought to require admission for laboratory monitoring, further cardiac evaluation. Patient will be admitted to hospitalist service.  Please see in patient provider note for additional treatment plan details.    The plan for this patient was discussed with Dr. Ashok Cordia, who voiced agreement and who oversaw evaluation and treatment of this patient.     Clinical Complexity  A medically appropriate history, review of systems, and physical exam was performed.   I personally reviewed the lab and imaging studies discussed above.   MDM generated using voice dictation software and may contain dictation errors. Please contact me for any clarification or with any  questions.               Final Clinical Impression(s) / ED Diagnoses Final diagnoses:  None    Rx / DC Orders ED Discharge Orders     None         Mahnoor Mathisen, [place], MD 03/02/22 0126    Lajean Saver, MD 03/05/22 1318

## 2022-03-02 ENCOUNTER — Observation Stay (HOSPITAL_BASED_OUTPATIENT_CLINIC_OR_DEPARTMENT_OTHER): Payer: Medicare Other

## 2022-03-02 DIAGNOSIS — I480 Paroxysmal atrial fibrillation: Secondary | ICD-10-CM

## 2022-03-02 DIAGNOSIS — R600 Localized edema: Secondary | ICD-10-CM

## 2022-03-02 DIAGNOSIS — Z933 Colostomy status: Secondary | ICD-10-CM | POA: Diagnosis not present

## 2022-03-02 DIAGNOSIS — M7989 Other specified soft tissue disorders: Secondary | ICD-10-CM

## 2022-03-02 DIAGNOSIS — E039 Hypothyroidism, unspecified: Secondary | ICD-10-CM

## 2022-03-02 DIAGNOSIS — E785 Hyperlipidemia, unspecified: Secondary | ICD-10-CM

## 2022-03-02 DIAGNOSIS — R072 Precordial pain: Secondary | ICD-10-CM | POA: Diagnosis not present

## 2022-03-02 DIAGNOSIS — R079 Chest pain, unspecified: Secondary | ICD-10-CM

## 2022-03-02 DIAGNOSIS — K56609 Unspecified intestinal obstruction, unspecified as to partial versus complete obstruction: Secondary | ICD-10-CM | POA: Diagnosis not present

## 2022-03-02 LAB — TROPONIN I (HIGH SENSITIVITY)
Troponin I (High Sensitivity): 159 ng/L (ref <18)
Troponin I (High Sensitivity): 163 ng/L (ref <18)

## 2022-03-02 LAB — ECHOCARDIOGRAM COMPLETE
AR max vel: 1.15 cm2
AV Area VTI: 1.07 cm2
AV Area mean vel: 1.04 cm2
AV Mean grad: 22 mmHg
AV Peak grad: 37.7 mmHg
Ao pk vel: 3.07 m/s
Area-P 1/2: 3.65 cm2
Height: 61 in
S' Lateral: 3 cm
Weight: 2368 oz

## 2022-03-02 LAB — LIPID PANEL
Cholesterol: 169 mg/dL (ref 0–200)
HDL: 39 mg/dL — ABNORMAL LOW (ref >40)
LDL Cholesterol: 119 mg/dL — ABNORMAL HIGH (ref 0–99)
Total CHOL/HDL Ratio: 4.3 RATIO
Triglycerides: 53 mg/dL (ref <150)
VLDL: 11 mg/dL (ref 0–40)

## 2022-03-02 NOTE — ED Notes (Signed)
Patient and family verbalizes understanding of discharge instructions. Opportunity for questioning and answers were provided. Pt assisted with getting dressed and discharged from the ED. Pt taken to ED entrance via wheelchair.

## 2022-03-02 NOTE — Progress Notes (Signed)
Left lower extremity venous duplex has been completed. Preliminary results can be found in CV Proc through chart review.   03/02/22 2:33 PM Shannon Scott RVT

## 2022-03-02 NOTE — Discharge Summary (Signed)
Physician Discharge Summary   Patient: Shannon Scott MRN: [id number] DOB: Oct 04, 1940  Admit date:     03/01/2022  Discharge date: 03/02/22  Discharge Physician: Patrecia Pour   PCP: Leeroy Cha, MD   Recommendations at discharge:  Follow up with PCP in 1-2 weeks Follow up with cardiology for stable moderate aortic stenosis, CAD, HLD.   Discharge Diagnoses: Principal Problem:   Chest pain Active Problems:   Edema of left lower extremity   Dyslipidemia   Hypothyroidism   Paroxysmal atrial fibrillation Shannon Scott)   Hospital Course: TOMEEKA Scott is a 81 y.o. female with a hx of CAD Scott/p CABG 2005, severe combined dyslipidemia who is intolerant of multiple cholesterol-lowering medications, atrial fibrillation, carotid artery disease, hypothyroidism, history of CVA, HTN, and recent covid-19 infection who presented to the ED 10/2 with sternal chest pain worse with breathing and palpation. Patient had seen her PCP earlier in the day who drew a troponin that was in the 60s. She was instructed to come to the ER for further evaluation. Labs in the ED showed Na 140, K 4.3, creatinine 0.85, creatinine 0.85, WBC 7.9, hemoglobin 9.3, platelets 435. hsTn 164>>156>>159.    CXR showed a small left pleural effusion.  CTA chest showed no evidence of PE, very small left pleural effusion, mild left basilar atelectasis, mild cardiomegaly with marked severity coronary artery calcification.   She was admitted with cardiology consultation. Echocardiogram revealed stable AS and preserved LVEF. Cardiology recommended no further testing. The patient'Scott symptoms are suspected to be musculoskeletal and/or pleuritic pain related to recent covid infection. No evidence of ACS, PE, dissection, pericarditis or myocarditis.   Assessment and Plan: Chest pain: Pleuritic and tender on palpation. This she has severe calcific CAD Scott/p CABG in 2005 and demand myocardial ischemia with flat troponin elevation. No  ischemic changes on ECG or wall motion abnormalities on echo. Cardiology does not have high suspicion for cardiac etiology to pain. No PE or dissection or other significant acute abnormality on CTA chest.  - Continue home medications and treat supportively with return precautions, stable for discharge as discussed with Dr. Stanford Breed this afternoon.   Paroxysmal atrial fibrillation: Maintaining NSR.  - Continue diltiazem, eliquis  Chronic LLE lymphedema: Scott/p radiation for malignancy remotely. No evidence of cellulitis at this time. No DVT on venous U/Scott.   Hypothyroidism - Continue synthroid  Dyslipidemia: Continue regimen of repatha, zetia.    Recent covid-19 infection: Outside scope of isolation, completed course of molnupiravir.   Moderate aortic stenosis: Stable on repeat echo this admission.  - Cardiology follow up recommended.   Consultants: Cardiology, Dr. Stanford Breed Procedures performed: Echocardiogram  Disposition: Home Diet recommendation:  Cardiac and Carb modified diet DISCHARGE MEDICATION: Allergies as of 03/02/2022       Reactions   Statins Other (See Comments)   Myalgias and memory problems   Ciprofloxacin Itching, Other (See Comments)   Splotchy redness with itching during IV infusion localized to arm. Other reaction(Scott): itching/swelling/redness at injection site        Medication List     STOP taking these medications    Lagevrio 200 MG Caps capsule Generic drug: molnupiravir EUA   Pfizer COVID-19 Vac Bivalent injection Generic drug: COVID-19 mRNA bivalent vaccine Therapist, music)       TAKE these medications    amoxicillin 500 MG capsule Commonly known as: AMOXIL Take 500 mg by mouth as needed (for dentist appointments).   apixaban 5 MG Tabs tablet Commonly known as: ELIQUIS Take 1  tablet (5 mg total) by mouth 2 (two) times daily.   ciclopirox 8 % solution Commonly known as: Penlac Apply topically at bedtime. Apply over nail and surrounding skin.  Apply daily over previous coat. After seven (7) days, may remove with alcohol and continue cycle. What changed:  how much to take when to take this additional instructions   clobetasol ointment 0.05 % Commonly known as: TEMOVATE Apply 1 application topically 3 (three) times a week. What changed:  when to take this reasons to take this   diltiazem 300 MG 24 hr capsule Commonly known as: TIAZAC TAKE 1 CAPSULE(300 MG) BY MOUTH DAILY What changed: See the new instructions.   ezetimibe 10 MG tablet Commonly known as: ZETIA Take 10 mg by mouth daily.   levothyroxine 50 MCG tablet Commonly known as: SYNTHROID Take 1 tablet (50 mcg total) by mouth daily before breakfast.   mometasone 0.1 % cream Commonly known as: ELOCON Apply 1 Application topically as needed (for flares).   polyethylene glycol 17 g packet Commonly known as: MIRALAX / GLYCOLAX Take 17 g by mouth daily as needed for severe constipation. What changed: when to take this   Repatha SureClick 409 MG/ML Soaj Generic drug: Evolocumab INJECT 1 DOSE(140MG) UNDER THE SKIN EVERY 14 DAYS What changed: See the new instructions.   traMADol 50 MG tablet Commonly known as: ULTRAM TAKE 2 TABLETS(100 MG) BY MOUTH TWICE DAILY What changed: See the new instructions.        Follow-up Information     Hilty, Nadean Corwin, MD Follow up.   Specialty: Cardiology Contact information: [place] [address] [phone]         Leeroy Cha, MD Follow up.   Specialty: Internal Medicine Contact information: [address] [place] [address] [phone]                Discharge Exam: Danley Danker Weights   03/01/22 1711  Weight: 67.1 kg  Elderly, pleasant, alert female in no distress RRR, II/VI SEM at RUSB, chronic woody lymphedema of LLE. No JVD +tenderness to palpation on left aspect of sternum. No visible or palpable deformities Clear, nonlabored  Condition  at discharge: stable  The results of significant diagnostics from this hospitalization (including imaging, microbiology, ancillary and laboratory) are listed below for reference.   Imaging Studies: VAS [place] LOWER EXTREMITY VENOUS (DVT)  Result Date: 03/02/2022  Lower Venous DVT Study Patient Name:  Shannon SIMA  Date of Exam:   03/02/2022 Medical Rec #: [id number]          Accession #:    [id number] Date of Birth: [birth year]           Patient Gender: F Patient Age:   81 years Exam Location:  [facility] Procedure:      VAS [place] LOWER EXTREMITY VENOUS (DVT) Referring Phys: Eugenie Norrie --------------------------------------------------------------------------------  Indications: Swelling.  Risk Factors: None identified. Limitations: Body habitus and poor ultrasound/tissue interface. Comparison Study: No prior studies. Performing Technologist: Oliver Hum RVT  Examination Guidelines: A complete evaluation includes B-mode imaging, spectral Doppler, color Doppler, and power Doppler as needed of all accessible portions of each vessel. Bilateral testing is considered an integral part of a complete examination. Limited examinations for reoccurring indications may be performed as noted. The reflux portion of the exam is performed with the patient in reverse Trendelenburg.  +-----+---------------+---------+-----------+----------+--------------+ RIGHTCompressibilityPhasicitySpontaneityPropertiesThrombus Aging +-----+---------------+---------+-----------+----------+--------------+ CFV  Full  Yes      Yes                                 +-----+---------------+---------+-----------+----------+--------------+   +---------+---------------+---------+-----------+----------+-------------------+ LEFT     CompressibilityPhasicitySpontaneityPropertiesThrombus Aging      +---------+---------------+---------+-----------+----------+-------------------+ CFV      Full           Yes      Yes                                       +---------+---------------+---------+-----------+----------+-------------------+ SFJ      Full                                                             +---------+---------------+---------+-----------+----------+-------------------+ FV Prox  Full                                                             +---------+---------------+---------+-----------+----------+-------------------+ FV Mid   Full                                                             +---------+---------------+---------+-----------+----------+-------------------+ FV Distal               Yes      Yes                                      +---------+---------------+---------+-----------+----------+-------------------+ PFV      Full                                                             +---------+---------------+---------+-----------+----------+-------------------+ POP      Full           Yes      Yes                                      +---------+---------------+---------+-----------+----------+-------------------+ PTV      Full                                                             +---------+---------------+---------+-----------+----------+-------------------+ PERO  Not well visualized +---------+---------------+---------+-----------+----------+-------------------+    Summary: RIGHT: - No evidence of common femoral vein obstruction.  LEFT: - There is no evidence of deep vein thrombosis in the lower extremity. However, portions of this examination were limited- see technologist comments above.  - No cystic structure found in the popliteal fossa.  *See table(Scott) above for measurements and observations. Electronically signed by Deitra Mayo MD on 03/02/2022 at 5:01:42 PM.    Final    ECHOCARDIOGRAM COMPLETE  Result Date: 03/02/2022    ECHOCARDIOGRAM REPORT   Patient Name:   AYANI OSPINA Date of Exam: 03/02/2022 Medical Rec #:  [id number]         Height:       61.0 in Accession #:    [id number]        Weight:       148.0 lb Date of Birth:  01-Apr-1941          BSA:          1.662 m Patient Age:    85 years          BP:           142/51 mmHg Patient Gender: F                 HR:           72 bpm. Exam Location:  Inpatient Procedure: 2D Echo, Color Doppler and Cardiac Doppler Indications:    R07.9* Chest pain, unspecified  History:        Patient has prior history of Echocardiogram examinations, most                 recent 03/27/2020. Prior CABG, Arrythmias:Atrial Fibrillation;                 Risk Factors:Hypertension and Dyslipidemia.  Sonographer:    Raquel Sarna Senior RDCS Referring Phys: [place] Comments: Technically difficult study, heart appears rotated. Apical window acquired at level of parasternals. IMPRESSIONS  1. Left ventricular ejection fraction, by estimation, is 60 to 65%. The left ventricle has normal function. The left ventricle has no regional wall motion abnormalities. Left ventricular diastolic parameters are consistent with Grade II diastolic dysfunction (pseudonormalization). Elevated left atrial pressure.  2. Right ventricular systolic function is normal. The right ventricular size is normal. There is moderately elevated pulmonary artery systolic pressure. The estimated right ventricular systolic pressure is 82.9 mmHg.  3. Left atrial size was severely dilated.  4. Right atrial size was severely dilated.  5. The mitral valve is degenerative. Mild to moderate mitral valve regurgitation. Mild mitral stenosis. The mean mitral valve gradient is 4.1 mmHg. Moderate to severe mitral annular calcification.  6. Tricuspid valve regurgitation is moderate.  7. The aortic valve is tricuspid. There is severe calcifcation of the aortic valve. There is severe thickening of the aortic valve. Aortic valve regurgitation is trivial. Moderate aortic valve stenosis. Aortic  valve Vmax measures 3.07 m/Scott.  8. The inferior vena cava is dilated in size with <50% respiratory variability, suggesting right atrial pressure of 15 mmHg. Comparison(Scott): Prior images reviewed side by side. Aortic stenosis has worsened. FINDINGS  Left Ventricle: Left ventricular ejection fraction, by estimation, is 60 to 65%. The left ventricle has normal function. The left ventricle has no regional wall motion abnormalities. The left ventricular internal cavity size was normal in size. There is  no left ventricular hypertrophy. Abnormal (paradoxical) septal motion consistent with post-operative status. Left ventricular diastolic  parameters are consistent with Grade II diastolic dysfunction (pseudonormalization). Elevated left atrial pressure. Right Ventricle: The right ventricular size is normal. No increase in right ventricular wall thickness. Right ventricular systolic function is normal. There is moderately elevated pulmonary artery systolic pressure. The tricuspid regurgitant velocity is 3.14 m/Scott, and with an assumed right atrial pressure of 15 mmHg, the estimated right ventricular systolic pressure is 13.0 mmHg. Left Atrium: Left atrial size was severely dilated. Right Atrium: Right atrial size was severely dilated. Pericardium: There is no evidence of pericardial effusion. Mitral Valve: The mitral valve is degenerative in appearance. Moderate to severe mitral annular calcification. Mild to moderate mitral valve regurgitation, with centrally-directed jet. Mild mitral valve stenosis. MV peak gradient, 9.1 mmHg. The mean mitral valve gradient is 4.1 mmHg. Tricuspid Valve: The tricuspid valve is normal in structure. Tricuspid valve regurgitation is moderate. Aortic Valve: The aortic valve is tricuspid. There is severe calcifcation of the aortic valve. There is severe thickening of the aortic valve. Aortic valve regurgitation is trivial. Moderate aortic stenosis is present. Aortic valve mean gradient measures   22.0 mmHg. Aortic valve peak gradient measures 37.7 mmHg. Aortic valve area, by VTI measures 1.07 cm. Pulmonic Valve: The pulmonic valve was normal in structure. Pulmonic valve regurgitation is not visualized. Aorta: The aortic root and ascending aorta are structurally normal, with no evidence of dilitation. Venous: The inferior vena cava is dilated in size with less than 50% respiratory variability, suggesting right atrial pressure of 15 mmHg. IAS/Shunts: The interatrial septum was not well visualized.  LEFT VENTRICLE PLAX 2D LVIDd:         4.20 cm   Diastology LVIDs:         3.00 cm   LV e' medial:    4.46 cm/Scott LV PW:         1.00 cm   LV E/e' medial:  28.5 LV IVS:        0.90 cm   LV e' lateral:   7.51 cm/Scott LVOT diam:     2.00 cm   LV E/e' lateral: 16.9 LV SV:         81 LV SV Index:   49 LVOT Area:     3.14 cm  RIGHT VENTRICLE RV Scott prime:     9.03 cm/Scott TAPSE (M-mode): 1.9 cm LEFT ATRIUM             Index        RIGHT ATRIUM           Index LA diam:        3.10 cm 1.87 cm/m   RA Area:     23.10 cm LA Vol (A2C):   85.5 ml 51.45 ml/m  RA Volume:   73.80 ml  44.41 ml/m LA Vol (A4C):   88.5 ml 53.25 ml/m LA Biplane Vol: 95.3 ml 57.34 ml/m  AORTIC VALVE AV Area (Vmax):    1.15 cm AV Area (Vmean):   1.04 cm AV Area (VTI):     1.07 cm AV Vmax:           307.00 cm/Scott AV Vmean:          224.000 cm/Scott AV VTI:            0.761 m AV Peak Grad:      37.7 mmHg AV Mean Grad:      22.0 mmHg LVOT Vmax:         112.00 cm/Scott LVOT Vmean:  74.500 cm/Scott LVOT VTI:          0.259 m LVOT/AV VTI ratio: 0.34  AORTA Ao Root diam: 3.10 cm Ao Asc diam:  2.90 cm MITRAL VALVE                TRICUSPID VALVE MV Area (PHT): 3.65 cm     TR Peak grad:   39.4 mmHg MV Peak grad:  9.1 mmHg     TR Vmax:        314.00 cm/Scott MV Mean grad:  4.1 mmHg MV Vmax:       1.51 m/Scott     SHUNTS MV Vmean:      92.5 cm/Scott    Systemic VTI:  0.26 m MV Decel Time: 208 msec     Systemic Diam: 2.00 cm MV E velocity: 127.00 cm/Scott MV A velocity: 82.40 cm/Scott MV E/A  ratio:  1.54 Mihai Croitoru MD Electronically signed by Sanda Klein MD Signature Date/Time: 03/02/2022/11:51:24 AM    Final    CT Angio Chest PE W and/or Wo Contrast  Result Date: 03/01/2022 CLINICAL DATA:  Chest pain upon deep inspiration. EXAM: CT ANGIOGRAPHY CHEST WITH CONTRAST TECHNIQUE: Multidetector CT imaging of the chest was performed using the standard protocol during bolus administration of intravenous contrast. Multiplanar CT image reconstructions and MIPs were obtained to evaluate the vascular anatomy. RADIATION DOSE REDUCTION: This exam was performed according to the departmental dose-optimization program which includes automated exposure control, adjustment of the mA and/or kV according to patient size and/or use of iterative reconstruction technique. CONTRAST:  79m OMNIPAQUE IOHEXOL 350 MG/ML SOLN COMPARISON:  December 18, 2021 FINDINGS: Cardiovascular: Stable right-sided venous Port-A-Cath positioning is seen. There is marked severity calcification of the thoracic aorta, without evidence of aortic aneurysm. Satisfactory opacification of the pulmonary arteries to the segmental level. No evidence of pulmonary embolism. There is mild cardiomegaly with marked severity coronary artery calcification. Marked severity calcification of the mitral annulus is also seen. No pericardial effusion. Mediastinum/Nodes: No enlarged mediastinal, hilar, or axillary lymph nodes. A stable, benign, subcentimeter nodule is seen within the right lobe of the thyroid gland. The trachea and esophagus demonstrate no significant findings. Lungs/Pleura: Mild left basilar atelectasis is seen. A very small left pleural effusion is noted. No pneumothorax is identified. Upper Abdomen: There is a small hiatal hernia. Musculoskeletal: Multiple sternal wires are noted. Degenerative changes seen throughout the thoracic spine. Review of the MIP images confirms the above findings. IMPRESSION: 1. No evidence of pulmonary embolism. 2. Mild  left basilar atelectasis. 3. Very small left pleural effusion. 4. Small hiatal hernia. 5. Mild cardiomegaly with marked severity coronary artery calcification. 6. Aortic atherosclerosis. Aortic Atherosclerosis (ICD10-I70.0). Electronically Signed   By: TVirgina NorfolkM.D.   On: 03/01/2022 23:33   DG Chest Port 1 View  Result Date: 03/01/2022 CLINICAL DATA:  Pain with inspiration, chest pain EXAM: PORTABLE CHEST 1 VIEW COMPARISON:  03/01/2022 at 2:39 p.m. FINDINGS: Single frontal view of the chest demonstrates a stable right chest wall port. Postsurgical changes from CABG. The cardiac silhouette is unremarkable. Trace left pleural effusion unchanged. Likely subsegmental atelectasis left lower lobe. No airspace disease or pneumothorax. IMPRESSION: 1. Stable trace left pleural effusion. 2. Minimal left lower lobe subsegmental atelectasis. Electronically Signed   By: MRanda NgoM.D.   On: 03/01/2022 20:05   DG Chest 2 View  Result Date: 03/01/2022 CLINICAL DATA:  Chest pain. EXAM: CHEST - 2 VIEW COMPARISON:  December 18, 2020. FINDINGS: Status post coronary  bypass graft. Stable cardiomediastinal silhouette. Right internal jugular Port-A-Cath is unchanged. Small left pleural effusion is noted. No pneumothorax is noted. Bony thorax is unremarkable. IMPRESSION: Small left pleural effusion. Electronically Signed   By: Marijo Conception M.D.   On: 03/01/2022 17:41    Microbiology: Results for orders placed or performed during the hospital encounter of 03/26/20  Respiratory Panel by RT PCR (Flu A&B, Covid) - Nasopharyngeal Swab     Status: None   Collection Time: 03/26/20  3:10 PM   Specimen: Nasopharyngeal Swab  Result Value Ref Range Status   SARS Coronavirus 2 by RT PCR NEGATIVE NEGATIVE Final    Comment: (NOTE) SARS-CoV-2 target nucleic acids are NOT DETECTED.  The SARS-CoV-2 RNA is generally detectable in upper respiratoy specimens during the acute phase of infection. The lowest concentration of  SARS-CoV-2 viral copies this assay can detect is 131 copies/mL. A negative result does not preclude SARS-Cov-2 infection and should not be used as the sole basis for treatment or other patient management decisions. A negative result may occur with  improper specimen collection/handling, submission of specimen other than nasopharyngeal swab, presence of viral mutation(Scott) within the areas targeted by this assay, and inadequate number of viral copies (<131 copies/mL). A negative result must be combined with clinical observations, patient history, and epidemiological information. The expected result is Negative.  Fact Sheet for Patients:  PinkCheek.be  Fact Sheet for Healthcare Providers:  GravelBags.it  This test is no t yet approved or cleared by the [place] FDA and  has been authorized for detection and/or diagnosis of SARS-CoV-2 by FDA under an Emergency Use Authorization (EUA). This EUA will remain  in effect (meaning this test can be used) for the duration of the COVID-19 declaration under Section 564(b)(1) of the Act, 21 U.Scott.C. section 360bbb-3(b)(1), unless the authorization is terminated or revoked sooner.     Influenza A by PCR NEGATIVE NEGATIVE Final   Influenza B by PCR NEGATIVE NEGATIVE Final    Comment: (NOTE) The Xpert Xpress SARS-CoV-2/FLU/RSV assay is intended as an aid in  the diagnosis of influenza from Nasopharyngeal swab specimens and  should not be used as a sole basis for treatment. Nasal washings and  aspirates are unacceptable for Xpert Xpress SARS-CoV-2/FLU/RSV  testing.  Fact Sheet for Patients: PinkCheek.be  Fact Sheet for Healthcare Providers: GravelBags.it  This test is not yet approved or cleared by the [place] FDA and  has been authorized for detection and/or diagnosis of SARS-CoV-2 by  FDA under an Emergency Use  Authorization (EUA). This EUA will remain  in effect (meaning this test can be used) for the duration of the  Covid-19 declaration under Section 564(b)(1) of the Act, 21  U.Scott.C. section 360bbb-3(b)(1), unless the authorization is  terminated or revoked. Performed at [facility], [place] [address] [place], [place] [address]     Labs: CBC: Recent Labs  Lab 03/01/22 1743  WBC 7.9  NEUTROABS 6.4  HGB 9.3*  HCT 30.1*  MCV 93.2  PLT 250*   Basic Metabolic Panel: Recent Labs  Lab 03/01/22 1743  NA 140  K 4.3  CL 105  CO2 25  GLUCOSE 126*  BUN 7*  CREATININE 0.85  CALCIUM 9.3   Liver Function Tests: Recent Labs  Lab 03/01/22 1743  AST 20  ALT 14  ALKPHOS 71  BILITOT 0.3  PROT 6.6  ALBUMIN 2.9*   CBG: No results for input(Scott): "GLUCAP" in the last 168 hours.  Discharge time  spent: greater than 30 minutes.  Signed: Patrecia Pour, MD [facility] 03/02/2022

## 2022-03-02 NOTE — Assessment & Plan Note (Addendum)
-   The patient will be admitted to a cardiac telemetry observation bed. -We will follow serial troponins. - Her troponins are currently elevated without significant beta.  Her chest CTA was negative for PE. - She will be placed on aspirin as well as as needed sublingual nitroglycerin and morphine sulfate. -Cardiology consult will be obtained. - I notified Gay Filler about the patient.

## 2022-03-02 NOTE — Assessment & Plan Note (Signed)
-   This associated with mild erythema and induration. - It could be her chronic lymphedema. - We will obtain venous Doppler to rule out DVT with the possibility of Eliquis failure.

## 2022-03-02 NOTE — Assessment & Plan Note (Signed)
-   We will continue Zetia.  The patient takes Repatha injections.

## 2022-03-02 NOTE — Assessment & Plan Note (Signed)
-   We will continue Synthroid. 

## 2022-03-02 NOTE — Assessment & Plan Note (Signed)
-   We will continue Eliquis and Cardizem CD.

## 2022-03-02 NOTE — ED Notes (Signed)
ED TO INPATIENT HANDOFF REPORT  ED Nurse Name and Phone #: Casey Burkitt [phone]  S Name/Age/Gender [name] 81 y.o. female Room/Bed: 020C/020C  Code Status   Code Status: Full Code  Home/SNF/Other Home Patient oriented to: self, place, time, and situation Is this baseline? Yes   Triage Complete: Triage complete  Chief Complaint Chest pain [R07.9]  Triage Note Sent by PCP bc she was seen for pain when she takes a deep breath and PCP did blood work and called and told her to come to ER bc he troponin was in the 60's.     Allergies Allergies  Allergen Reactions   Statins Other (See Comments)    Myalgias and memory problems   Ciprofloxacin Itching and Other (See Comments)    Splotchy redness with itching during IV infusion localized to arm. Other reaction(s): itching/swelling/redness at injection site    Level of Care/Admitting Diagnosis ED Disposition     ED Disposition  Admit   Condition  --   [place]: [place] [[id number]]  Level of Care: Telemetry Cardiac [103]  May place patient in observation at [facility] or [place] if equivalent level of care is available:: No  Covid Evaluation: Asymptomatic - no recent exposure (last 10 days) testing not required  Diagnosis: Chest pain [774142]  Admitting Physician: Christel Mormon [[id number]]  Attending Physician: Christel Mormon [[id number]]          B Medical/Surgery History Past Medical History:  Diagnosis Date   A-fib ([place]) 03/26/2020   Acute CVA (cerebrovascular accident) ([place]) 03/26/2020   Arthritis    Asthma    allergy induced   Bilateral carotid artery disease ([place])    L carotid bruit   Bursitis    left hip   CAD (coronary artery disease)    Cancer ([place])    Dyslipidemia    intolerant to statins, welchol, niacin, zetia   Goals of care, counseling/discussion 10/11/2017   History of blood transfusion    History of nuclear stress test 04/24/2012   lexiscan; normal  study   Hypertension    Hypothyroidism    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 ([place]) 10/11/2017   Postoperative nausea and vomiting 01/02/2016   Past Surgical History:  Procedure Laterality Date   ABDOMINAL HYSTERECTOMY     Carotid Doppler  02/2012   40-59% right int carotid artery stenosis; 60-79% L int carotid stenosis; L carotid bruit   CORONARY ARTERY BYPASS GRAFT  03/12/2004   LIMA to LAD, SVG to circumflex, SVG to PDA   CYSTOSCOPY W/ URETERAL STENT PLACEMENT Left 12/06/2017   Procedure: CYSTOSCOPY WITH LEFT URETERAL STENT EXCHANGE;  Surgeon: Irine Seal, MD;  Location: [facility];  Service: Urology;  Laterality: Left;   CYSTOSCOPY WITH STENT PLACEMENT Bilateral 08/21/2017   Procedure: CYSTOSCOPY WITH STENT PLACEMENT;  Surgeon: Ceasar Mons, MD;  Location: [facility];  Service: Urology;  Laterality: Bilateral;   IR FLUORO GUIDE PORT INSERTION RIGHT  10/11/2017   IR GENERIC HISTORICAL  04/29/2016   IR RADIOLOGIST EVAL & MGMT 04/29/2016 Sandi Mariscal, MD [facility] INTERV RAD   IR GENERIC HISTORICAL  05/12/2016   IR RADIOLOGIST EVAL & MGMT 05/12/2016 Sandi Mariscal, MD [facility] INTERV RAD   IR IVC FILTER PLMT / S&I Burke Keels GUID/MOD SED  10/11/2017   IR US GUIDE VASC ACCESS RIGHT  10/11/2017   ROBOTIC ASSISTED TOTAL HYSTERECTOMY WITH BILATERAL SALPINGO OOPHERECTOMY Bilateral 12/30/2015   Procedure: XI ROBOTIC  ASSISTED TOTAL HYSTERECTOMY WITH BILATERAL SALPINGO OOPHORECTOMY WITH SENTAL LYMPH NODE BIOPSY;  Surgeon: Nancy Marus, MD;  Location: [facility];  Service: Gynecology;  Laterality: Bilateral;   TONSILLECTOMY     TRANSTHORACIC ECHOCARDIOGRAM  04/2007   EF>55%; mild MR; mild-mod TR; mild pulm HTN; mild calcification of aortiv valve leaflets with mild valvular aortic stenosis   TUBAL LIGATION       A IV Location/Drains/Wounds Patient Lines/Drains/Airways Status     Active Line/Drains/Airways     Name Placement date Placement time Site Days   Implanted Port  10/11/17 Right Chest 10/11/17  1235  Chest  1603   Peripheral IV 03/01/22 20 G Right Antecubital 03/01/22  2028  Antecubital  1   Colostomy LUQ 08/21/17  0949  LUQ  1654   Ureteral Drain/Stent Left ureter 6 Fr. 12/06/17  0756  Left ureter  1547   Incision (Closed) 08/21/17 Abdomen Other (Comment) 08/21/17  0950  -- 1654   Incision - 3 Ports Abdomen 1: Right;Lateral 2: Right;Medial 3: Lower;Mid 08/21/17  0930  -- 1654            Intake/Output Last 24 hours  Intake/Output Summary (Last 24 hours) at 03/02/2022 1521 Last data filed at 03/02/2022 1052 Gross per 24 hour  Intake 717.16 ml  Output --  Net 717.16 ml    Labs/Imaging Results for orders placed or performed during the hospital encounter of 03/01/22 (from the past 48 hour(s))  Comprehensive metabolic panel     Status: Abnormal   Collection Time: 03/01/22  5:43 PM  Result Value Ref Range   Sodium 140 135 - 145 mmol/L   Potassium 4.3 3.5 - 5.1 mmol/L   Chloride 105 98 - 111 mmol/L   CO2 25 22 - 32 mmol/L   Glucose, Bld 126 (H) 70 - 99 mg/dL    Comment: Glucose reference range applies only to samples taken after fasting for at least 8 hours.   BUN 7 (L) 8 - 23 mg/dL   Creatinine, Ser 0.85 0.44 - 1.00 mg/dL   Calcium 9.3 8.9 - 10.3 mg/dL   Total Protein 6.6 6.5 - 8.1 g/dL   Albumin 2.9 (L) 3.5 - 5.0 g/dL   AST 20 15 - 41 U/L   ALT 14 0 - 44 U/L   Alkaline Phosphatase 71 38 - 126 U/L   Total Bilirubin 0.3 0.3 - 1.2 mg/dL   GFR, Estimated >60 >60 mL/min    Comment: (NOTE) Calculated using the CKD-EPI Creatinine Equation (2021)    Anion gap 10 5 - 15    Comment: Performed at [facility] [address] [place], [place] [address]  CBC with Differential     Status: Abnormal   Collection Time: 03/01/22  5:43 PM  Result Value Ref Range   WBC 7.9 4.0 - 10.5 K/uL   RBC 3.23 (L) 3.87 - 5.11 MIL/uL   Hemoglobin 9.3 (L) 12.0 - 15.0 g/dL   HCT 30.1 (L) 36.0 - 46.0 %   MCV 93.2 80.0 - 100.0 fL   MCH 28.8 26.0 - 34.0 pg    MCHC 30.9 30.0 - 36.0 g/dL   RDW 13.5 11.5 - 15.5 %   Platelets 435 (H) 150 - 400 K/uL   nRBC 0.0 0.0 - 0.2 %   Neutrophils Relative % 80 %   Neutro Abs 6.4 1.7 - 7.7 K/uL   Lymphocytes Relative 10 %   Lymphs Abs 0.8 0.7 - 4.0 K/uL   Monocytes Relative 8 %  Monocytes Absolute 0.6 0.1 - 1.0 K/uL   Eosinophils Relative 1 %   Eosinophils Absolute 0.0 0.0 - 0.5 K/uL   Basophils Relative 1 %   Basophils Absolute 0.0 0.0 - 0.1 K/uL   Immature Granulocytes 0 %   Abs Immature Granulocytes 0.03 0.00 - 0.07 K/uL    Comment: Performed at [facility] [address] [place], [place] [address]  Troponin I (High Sensitivity)     Status: Abnormal   Collection Time: 03/01/22  5:43 PM  Result Value Ref Range   Troponin I (High Sensitivity) 164 (HH) <18 ng/L    Comment: CRITICAL RESULT CALLED TO, READ BACK BY AND VERIFIED WITH M.ROBERT,RN @1857  03/01/2022 VANG.J (NOTE) Elevated high sensitivity troponin I (hsTnI) values and significant  changes across serial measurements may suggest ACS but many other  chronic and acute conditions are known to elevate hsTnI results.  Refer to the "Links" section for chest pain algorithms and additional  guidance. Performed at [facility], [place] [address], [place], [place] [address]   Troponin I (High Sensitivity)     Status: Abnormal   Collection Time: 03/01/22  8:33 PM  Result Value Ref Range   Troponin I (High Sensitivity) 156 (HH) <18 ng/L    Comment: CRITICAL VALUE NOTED. VALUE IS CONSISTENT WITH PREVIOUSLY REPORTED/CALLED VALUE (NOTE) Elevated high sensitivity troponin I (hsTnI) values and significant  changes across serial measurements may suggest ACS but many other  chronic and acute conditions are known to elevate hsTnI results.  Refer to the "Links" section for chest pain algorithms and additional  guidance. Performed at [facility], [place] [address], [place], [place] [address]   Troponin I (High Sensitivity)     Status:  Abnormal   Collection Time: 03/02/22  2:15 AM  Result Value Ref Range   Troponin I (High Sensitivity) 159 (HH) <18 ng/L    Comment: CRITICAL VALUE NOTED. VALUE IS CONSISTENT WITH PREVIOUSLY REPORTED/CALLED VALUE (NOTE) Elevated high sensitivity troponin I (hsTnI) values and significant  changes across serial measurements may suggest ACS but many other  chronic and acute conditions are known to elevate hsTnI results.  Refer to the "Links" section for chest pain algorithms and additional  guidance. Performed at [facility], [place] [address] [place],  [address]   Lipid panel     Status: Abnormal   Collection Time: 03/02/22  2:15 AM  Result Value Ref Range   Cholesterol 169 0 - 200 mg/dL   Triglycerides 53 <150 mg/dL   HDL 39 (L) >40 mg/dL   Total CHOL/HDL Ratio 4.3 RATIO   VLDL 11 0 - 40 mg/dL   LDL Cholesterol 119 (H) 0 - 99 mg/dL    Comment:        Total Cholesterol/HDL:CHD Risk Coronary Heart Disease Risk Table                     Men   Women  1/2 Average Risk   3.4   3.3  Average Risk       5.0   4.4  2 X Average Risk   9.6   7.1  3 X Average Risk  23.4   11.0        Use the calculated Patient Ratio above and the CHD Risk Table to determine the patient's CHD Risk.        ATP III CLASSIFICATION (LDL):  <100     mg/dL   Optimal  100-129  mg/dL   Near or Above                    Optimal  130-159  mg/dL   Borderline  160-189  mg/dL   High  >190     mg/dL   Very High Performed at [facility] [address], [place], [place] [address]   Troponin I (High Sensitivity)     Status: Abnormal   Collection Time: 03/02/22  3:41 AM  Result Value Ref Range   Troponin I (High Sensitivity) 163 (HH) <18 ng/L    Comment: CRITICAL VALUE NOTED. VALUE IS CONSISTENT WITH PREVIOUSLY REPORTED/CALLED VALUE (NOTE) Elevated high sensitivity troponin I (hsTnI) values and significant  changes across serial measurements may suggest ACS but many other  chronic and acute  conditions are known to elevate hsTnI results.  Refer to the "Links" section for chest pain algorithms and additional  guidance. Performed at [facility], [place] [address], [place], [place] [address]    ECHOCARDIOGRAM COMPLETE  Result Date: 03/02/2022    ECHOCARDIOGRAM REPORT   Patient Name:   EMREY THORNLEY Date of Exam: 03/02/2022 Medical Rec #:  [id number]         Height:       61.0 in Accession #:    [id number]        Weight:       148.0 lb Date of Birth:  10-07-1940          BSA:          1.662 m Patient Age:    7 years          BP:           142/51 mmHg Patient Gender: F                 HR:           72 bpm. Exam Location:  Inpatient Procedure: 2D Echo, Color Doppler and Cardiac Doppler Indications:    R07.9* Chest pain, unspecified  History:        Patient has prior history of Echocardiogram examinations, most                 recent 03/27/2020. Prior CABG, Arrythmias:Atrial Fibrillation;                 Risk Factors:Hypertension and Dyslipidemia.  Sonographer:    Raquel Sarna Senior RDCS Referring Phys: [place] Comments: Technically difficult study, heart appears rotated. Apical window acquired at level of parasternals. IMPRESSIONS  1. Left ventricular ejection fraction, by estimation, is 60 to 65%. The left ventricle has normal function. The left ventricle has no regional wall motion abnormalities. Left ventricular diastolic parameters are consistent with Grade II diastolic dysfunction (pseudonormalization). Elevated left atrial pressure.  2. Right ventricular systolic function is normal. The right ventricular size is normal. There is moderately elevated pulmonary artery systolic pressure. The estimated right ventricular systolic pressure is 91.6 mmHg.  3. Left atrial size was severely dilated.  4. Right atrial size was severely dilated.  5. The mitral valve is degenerative. Mild to moderate mitral valve regurgitation. Mild mitral stenosis. The mean mitral valve gradient is 4.1  mmHg. Moderate to severe mitral annular calcification.  6. Tricuspid valve regurgitation is moderate.  7. The aortic valve is tricuspid. There is severe calcifcation of the aortic valve. There is severe thickening of the aortic valve. Aortic valve regurgitation is trivial. Moderate  aortic valve stenosis. Aortic valve Vmax measures 3.07 m/s.  8. The inferior vena cava is dilated in size with <50% respiratory variability, suggesting right atrial pressure of 15 mmHg. Comparison(s): Prior images reviewed side by side. Aortic stenosis has worsened. FINDINGS  Left Ventricle: Left ventricular ejection fraction, by estimation, is 60 to 65%. The left ventricle has normal function. The left ventricle has no regional wall motion abnormalities. The left ventricular internal cavity size was normal in size. There is  no left ventricular hypertrophy. Abnormal (paradoxical) septal motion consistent with post-operative status. Left ventricular diastolic parameters are consistent with Grade II diastolic dysfunction (pseudonormalization). Elevated left atrial pressure. Right Ventricle: The right ventricular size is normal. No increase in right ventricular wall thickness. Right ventricular systolic function is normal. There is moderately elevated pulmonary artery systolic pressure. The tricuspid regurgitant velocity is 3.14 m/s, and with an assumed right atrial pressure of 15 mmHg, the estimated right ventricular systolic pressure is 47.4 mmHg. Left Atrium: Left atrial size was severely dilated. Right Atrium: Right atrial size was severely dilated. Pericardium: There is no evidence of pericardial effusion. Mitral Valve: The mitral valve is degenerative in appearance. Moderate to severe mitral annular calcification. Mild to moderate mitral valve regurgitation, with centrally-directed jet. Mild mitral valve stenosis. MV peak gradient, 9.1 mmHg. The mean mitral valve gradient is 4.1 mmHg. Tricuspid Valve: The tricuspid valve is normal in  structure. Tricuspid valve regurgitation is moderate. Aortic Valve: The aortic valve is tricuspid. There is severe calcifcation of the aortic valve. There is severe thickening of the aortic valve. Aortic valve regurgitation is trivial. Moderate aortic stenosis is present. Aortic valve mean gradient measures  22.0 mmHg. Aortic valve peak gradient measures 37.7 mmHg. Aortic valve area, by VTI measures 1.07 cm. Pulmonic Valve: The pulmonic valve was normal in structure. Pulmonic valve regurgitation is not visualized. Aorta: The aortic root and ascending aorta are structurally normal, with no evidence of dilitation. Venous: The inferior vena cava is dilated in size with less than 50% respiratory variability, suggesting right atrial pressure of 15 mmHg. IAS/Shunts: The interatrial septum was not well visualized.  LEFT VENTRICLE PLAX 2D LVIDd:         4.20 cm   Diastology LVIDs:         3.00 cm   LV e' medial:    4.46 cm/s LV PW:         1.00 cm   LV E/e' medial:  28.5 LV IVS:        0.90 cm   LV e' lateral:   7.51 cm/s LVOT diam:     2.00 cm   LV E/e' lateral: 16.9 LV SV:         81 LV SV Index:   49 LVOT Area:     3.14 cm  RIGHT VENTRICLE RV S prime:     9.03 cm/s TAPSE (M-mode): 1.9 cm LEFT ATRIUM             Index        RIGHT ATRIUM           Index LA diam:        3.10 cm 1.87 cm/m   RA Area:     23.10 cm LA Vol (A2C):   85.5 ml 51.45 ml/m  RA Volume:   73.80 ml  44.41 ml/m LA Vol (A4C):   88.5 ml 53.25 ml/m LA Biplane Vol: 95.3 ml 57.34 ml/m  AORTIC VALVE AV Area (Vmax):    1.15 cm  AV Area (Vmean):   1.04 cm AV Area (VTI):     1.07 cm AV Vmax:           307.00 cm/s AV Vmean:          224.000 cm/s AV VTI:            0.761 m AV Peak Grad:      37.7 mmHg AV Mean Grad:      22.0 mmHg LVOT Vmax:         112.00 cm/s LVOT Vmean:        74.500 cm/s LVOT VTI:          0.259 m LVOT/AV VTI ratio: 0.34  AORTA Ao Root diam: 3.10 cm Ao Asc diam:  2.90 cm MITRAL VALVE                TRICUSPID VALVE MV Area (PHT): 3.65  cm     TR Peak grad:   39.4 mmHg MV Peak grad:  9.1 mmHg     TR Vmax:        314.00 cm/s MV Mean grad:  4.1 mmHg MV Vmax:       1.51 m/s     SHUNTS MV Vmean:      92.5 cm/s    Systemic VTI:  0.26 m MV Decel Time: 208 msec     Systemic Diam: 2.00 cm MV E velocity: 127.00 cm/s MV A velocity: 82.40 cm/s MV E/A ratio:  1.54 Mihai Croitoru MD Electronically signed by Sanda Klein MD Signature Date/Time: 03/02/2022/11:51:24 AM    Final    CT Angio Chest PE W and/or Wo Contrast  Result Date: 03/01/2022 CLINICAL DATA:  Chest pain upon deep inspiration. EXAM: CT ANGIOGRAPHY CHEST WITH CONTRAST TECHNIQUE: Multidetector CT imaging of the chest was performed using the standard protocol during bolus administration of intravenous contrast. Multiplanar CT image reconstructions and MIPs were obtained to evaluate the vascular anatomy. RADIATION DOSE REDUCTION: This exam was performed according to the departmental dose-optimization program which includes automated exposure control, adjustment of the mA and/or kV according to patient size and/or use of iterative reconstruction technique. CONTRAST:  71m OMNIPAQUE IOHEXOL 350 MG/ML SOLN COMPARISON:  December 18, 2021 FINDINGS: Cardiovascular: Stable right-sided venous Port-A-Cath positioning is seen. There is marked severity calcification of the thoracic aorta, without evidence of aortic aneurysm. Satisfactory opacification of the pulmonary arteries to the segmental level. No evidence of pulmonary embolism. There is mild cardiomegaly with marked severity coronary artery calcification. Marked severity calcification of the mitral annulus is also seen. No pericardial effusion. Mediastinum/Nodes: No enlarged mediastinal, hilar, or axillary lymph nodes. A stable, benign, subcentimeter nodule is seen within the right lobe of the thyroid gland. The trachea and esophagus demonstrate no significant findings. Lungs/Pleura: Mild left basilar atelectasis is seen. A very small left pleural  effusion is noted. No pneumothorax is identified. Upper Abdomen: There is a small hiatal hernia. Musculoskeletal: Multiple sternal wires are noted. Degenerative changes seen throughout the thoracic spine. Review of the MIP images confirms the above findings. IMPRESSION: 1. No evidence of pulmonary embolism. 2. Mild left basilar atelectasis. 3. Very small left pleural effusion. 4. Small hiatal hernia. 5. Mild cardiomegaly with marked severity coronary artery calcification. 6. Aortic atherosclerosis. Aortic Atherosclerosis (ICD10-I70.0). Electronically Signed   By: TVirgina NorfolkM.D.   On: 03/01/2022 23:33   DG Chest Port 1 View  Result Date: 03/01/2022 CLINICAL DATA:  Pain with inspiration, chest pain EXAM: PORTABLE CHEST 1 VIEW COMPARISON:  03/01/2022  at 2:39 p.m. FINDINGS: Single frontal view of the chest demonstrates a stable right chest wall port. Postsurgical changes from CABG. The cardiac silhouette is unremarkable. Trace left pleural effusion unchanged. Likely subsegmental atelectasis left lower lobe. No airspace disease or pneumothorax. IMPRESSION: 1. Stable trace left pleural effusion. 2. Minimal left lower lobe subsegmental atelectasis. Electronically Signed   By: Randa Ngo M.D.   On: 03/01/2022 20:05   DG Chest 2 View  Result Date: 03/01/2022 CLINICAL DATA:  Chest pain. EXAM: CHEST - 2 VIEW COMPARISON:  December 18, 2020. FINDINGS: Status post coronary bypass graft. Stable cardiomediastinal silhouette. Right internal jugular Port-A-Cath is unchanged. Small left pleural effusion is noted. No pneumothorax is noted. Bony thorax is unremarkable. IMPRESSION: Small left pleural effusion. Electronically Signed   By: Marijo Conception M.D.   On: 03/01/2022 17:41    Pending Labs Unresulted Labs (From admission, onward)     Start     Ordered   03/02/22 0953  C-reactive protein  Once,   R        03/02/22 0952   03/02/22 0953  Sedimentation rate  Once,   R        03/02/22 0952             Vitals/Pain Today's Vitals   03/02/22 0915 03/02/22 1201 03/02/22 1245 03/02/22 1315  BP: (!) 143/55  (!) 150/54 (!) 159/56  Pulse: 67  67 69  Resp: (!) 25  18 (!) 23  Temp:  98.7 F (37.1 C)    TempSrc:  Oral    SpO2: 94%  93% 96%  Weight:      Height:      PainSc:        Isolation Precautions No active isolations  Medications Medications  traMADol (ULTRAM) tablet 100 mg (100 mg Oral Not Given 03/02/22 1050)  diltiazem (CARDIZEM CD) 24 hr capsule 300 mg (has no administration in time range)  ezetimibe (ZETIA) tablet 10 mg (10 mg Oral Not Given 03/02/22 1051)  levothyroxine (SYNTHROID) tablet 50 mcg (50 mcg Oral Given 03/02/22 0822)  polyethylene glycol (MIRALAX / GLYCOLAX) packet 17 g (17 g Oral Not Given 03/02/22 1043)  apixaban (ELIQUIS) tablet 5 mg (5 mg Oral Given 03/02/22 0821)  acetaminophen (TYLENOL) tablet 650 mg (has no administration in time range)  ondansetron (ZOFRAN) injection 4 mg (has no administration in time range)  0.9 %  sodium chloride infusion (0 mLs Intravenous Stopped 03/02/22 1500)  aspirin EC tablet 81 mg (81 mg Oral Not Given 03/02/22 1051)  morphine (PF) 2 MG/ML injection 2 mg (has no administration in time range)  nitroGLYCERIN (NITROSTAT) SL tablet 0.4 mg (has no administration in time range)  iohexol (OMNIPAQUE) 350 MG/ML injection 80 mL (80 mLs Intravenous Contrast Given 03/01/22 2323)    Mobility walks with person assist Low fall risk   Focused Assessments Cardiac Assessment Handoff:  Cardiac Rhythm: Normal sinus rhythm Lab Results  Component Value Date   TROPONINI 0.28 ([place]) 08/20/2017   No results found for: "DDIMER" Does the Patient currently have chest pain? No  Pt states "slightly when I take a deep breath"  , Pulmonary Assessment Handoff:  Lung sounds:   O2 Device: Room Air      R Recommendations: See Admitting Provider Note  Report given to:   Additional Notes:

## 2022-03-02 NOTE — Progress Notes (Signed)
Echocardiogram 2D Echocardiogram has been performed.  Oneal Deputy Aquilla Shambley RDCS 03/02/2022, 11:39 AM

## 2022-03-02 NOTE — Consult Note (Signed)
Cardiology Consultation   Patient ID: Shannon Scott MRN: [id number]; DOB: [birth year]  Admit date: 03/01/2022 Date of Consult: 03/02/2022  PCP:  Leeroy Cha, [place] [facility] Cardiologist:  Pixie Casino, MD       Patient Profile:   Shannon Scott is a 81 y.o. female with a hx of CAD s/p CABG 2005, severe combined dyslipidemia who is intolerant of multiple cholesterol-lowering medications, atrial fibrillation, carotid artery disease, hypothyroidism, history of CVA, HTN who is being seen 03/02/2022 for the evaluation of chest pain, elevated troponin at the request of Dr. Bonner Puna  History of Present Illness:   Shannon Scott is an 81 year old female with above medical history who is followed by Dr. Debara Pickett.  Per chart review, patient has a distant history of CAD and had a CABG in 2005 with LIMA to LAD, SVG to circumflex, SVG to PDA.  Her most recent ischemic evaluation was a stress test in 2013 that was negative for ischemia. She also has a history of carotid artery disease, underwent carotid Dopplers in 2016 that demonstrated 40% to 59% right internal carotid stenosis and 60% to 79% left internal carotid stenosis. Does not appear to have had recent studies.   Patient was admitted to the hospital in 02/2020 with expressive aphasia, right-sided headache and neck pain.  Work-up revealed a left MCA infarct.  She was started on Eliquis for stroke prevention.  Echocardiogram on 10/28/021 showed EF 60-65%, no regional wall motion abnormalities, mild LVH with grade 2 diastolic dysfunction, normal RV systolic pressure, mildly elevated pulmonary artery systolic pressure, mild mitral valve regurgitation, moderate aortic valve stenosis.  Patient was last seen by cardiology on 11/23/2021.  At that time, patient continued to have elevated cholesterol and she was treated with repatha and ezetimibe.   Patient presented to the ER on 10/2 complaining of chest pain when she  takes a deep breath. Patient had seen her PCP earlier in the day who drew a troponin that was in the 60s. She was instructed to come to the ER for further evaluation. Labs in the ED showed Na 140, K 4.3, creatinine 0.85, creatinine 0.85, WBC 7.9, hemoglobin 9.3, platelets 435. hsTn 164>>156>>159.   CXR showed a small left pleural effusion.  CTA chest showed no evidence of PE, very small left pleural effusion, mild left basilar atelectasis, mild cardiomegaly with marked severity coronary artery calcification.   On interview, patient reports that she had COVID about 2-3 weeks ago.  She did recover well from [place] and was in her usual state of health for about 2 weeks.  For the past 3 days, she has been having chest pain when taking a deep breath.  Chest pain is midsternal, does not radiate.  Pain is only present on deep inhalation, is not present with exertion, shallow breathing, or at rest.  Has been improving over the past 3 days.  She has also noticed some chest discomfort when laying in certain positions. Associated with mild SOB. Reports also having a pain that goes from her neck down to her flanks.  She denies having cough, fever, chills, body aches.  Past Medical History:  Diagnosis Date   A-fib ([place]) 03/26/2020   Acute CVA (cerebrovascular accident) (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 ([place]) 10/11/2017   Postoperative nausea and vomiting 01/02/2016    Past Surgical History:  Procedure Laterality Date    ABDOMINAL HYSTERECTOMY     Carotid Doppler  02/2012   40-59% right int carotid artery stenosis; 60-79% L int carotid stenosis; L carotid bruit   CORONARY ARTERY BYPASS GRAFT  03/12/2004   LIMA to LAD, SVG to circumflex, SVG to PDA   CYSTOSCOPY W/ URETERAL STENT PLACEMENT Left 12/06/2017   Procedure: CYSTOSCOPY WITH LEFT URETERAL STENT EXCHANGE;  Surgeon: Irine Seal, MD;  Location: [facility];  Service: Urology;  Laterality: Left;   CYSTOSCOPY WITH STENT PLACEMENT Bilateral 08/21/2017   Procedure: CYSTOSCOPY WITH STENT PLACEMENT;  Surgeon: Ceasar Mons, MD;  Location: [facility];  Service: Urology;  Laterality: Bilateral;   IR FLUORO GUIDE PORT INSERTION RIGHT  10/11/2017   IR GENERIC HISTORICAL  04/29/2016   IR RADIOLOGIST EVAL & MGMT 04/29/2016 Sandi Mariscal, MD [facility]   IR GENERIC HISTORICAL  05/12/2016   IR RADIOLOGIST EVAL & MGMT 05/12/2016 Sandi Mariscal, MD [facility]   IR IVC FILTER PLMT / S&I Burke Keels GUID/MOD SED  10/11/2017   IR US GUIDE VASC ACCESS RIGHT  10/11/2017   ROBOTIC ASSISTED TOTAL HYSTERECTOMY WITH BILATERAL SALPINGO OOPHERECTOMY Bilateral 12/30/2015   Procedure: XI ROBOTIC ASSISTED TOTAL HYSTERECTOMY WITH BILATERAL SALPINGO OOPHORECTOMY WITH SENTAL LYMPH NODE BIOPSY;  Surgeon: Nancy Marus, MD;  Location: [facility];  Service: Gynecology;  Laterality: Bilateral;   TONSILLECTOMY     TRANSTHORACIC ECHOCARDIOGRAM  04/2007   EF>55%; mild MR; mild-mod TR; mild pulm HTN; mild calcification of aortiv valve leaflets with mild valvular aortic stenosis   TUBAL LIGATION       Home Medications:  Prior to Admission medications   Medication Sig Start Date End Date Taking? Authorizing Provider  amoxicillin (AMOXIL) 500 MG capsule Take 500 mg by mouth as needed (for dentist appointments). 02/08/22  Yes [provider]  apixaban (ELIQUIS) 5 MG TABS tablet Take 1 tablet (5 mg total) by mouth 2 (two) times daily. 03/29/20  Yes Amin, Ankit Chirag, MD  ciclopirox (PENLAC) 8 %  solution Apply topically at bedtime. Apply over nail and surrounding skin. Apply daily over previous coat. After seven (7) days, may remove with alcohol and continue cycle. Patient taking differently: Apply 1 application  topically See admin instructions. Apply over nail and surrounding skin as needed. Apply daily over previous coat. After seven (7) days, may remove with alcohol and continue cycle. 01/21/22  Yes Trula Slade, DPM  clobetasol ointment (TEMOVATE) 0.86 % Apply 1 application topically 3 (three) times a week. Patient taking differently: Apply 1 application  topically as needed (for flares). 04/23/20  Yes Lafonda Mosses, MD  diltiazem (TIAZAC) 300 MG 24 hr capsule TAKE 1 CAPSULE(300 MG) BY MOUTH DAILY Patient taking differently: Take 300 mg by mouth at bedtime. 06/13/18  Yes Ennever, Rudell Cobb, MD  Evolocumab (REPATHA SURECLICK) 761 MG/ML SOAJ INJECT 1 DOSE(140MG) UNDER THE SKIN EVERY 14 DAYS Patient taking differently: Inject 140 mg into the skin every 14 (fourteen) days. 05/27/21  Yes Hilty, Nadean Corwin, MD  ezetimibe (ZETIA)  10 MG tablet Take 10 mg by mouth daily. 06/05/21  Yes [provider]  levothyroxine (SYNTHROID, LEVOTHROID) 50 MCG tablet Take 1 tablet (50 mcg total) by mouth daily before breakfast. 10/25/17  Yes Tanner, Lyndon Code., PA-C  mometasone (ELOCON) 0.1 % cream Apply 1 Application topically as needed (for flares). 10/05/21  Yes [provider]  polyethylene glycol (MIRALAX / GLYCOLAX) 17 g packet Take 17 g by mouth daily as needed for severe constipation. Patient taking differently: Take 17 g by mouth daily. 03/29/20  Yes Amin, Ankit Chirag, MD  traMADol (ULTRAM) 50 MG tablet TAKE 2 TABLETS(100 MG) BY MOUTH TWICE DAILY Patient taking differently: Take 100 mg by mouth 2 (two) times daily. 01/28/22  Yes Volanda Napoleon, MD  COVID-19 mRNA bivalent vaccine, Pfizer, (PFIZER COVID-19 VAC BIVALENT) injection Inject into the muscle. 01/01/22   Carlyle Basques, MD   LAGEVRIO 200 MG CAPS capsule SMARTSIG:4 Capsule(s) By Mouth Every 12 Hours Patient not taking: Reported on 03/01/2022 02/15/22   [provider]    Inpatient Medications: Scheduled Meds:  apixaban  5 mg Oral BID   aspirin EC  81 mg Oral Daily   diltiazem  300 mg Oral QHS   ezetimibe  10 mg Oral Daily   levothyroxine  50 mcg Oral Q0600   polyethylene glycol  17 g Oral Daily   traMADol  100 mg Oral BID   Continuous Infusions:  sodium chloride 100 mL/hr at 03/02/22 0149   PRN Meds: acetaminophen, morphine injection, nitroGLYCERIN, ondansetron (ZOFRAN) IV  Allergies:    Allergies  Allergen Reactions   Statins Other (See Comments)    Myalgias and memory problems   Ciprofloxacin Itching and Other (See Comments)    Splotchy redness with itching during IV infusion localized to arm. Other reaction(s): itching/swelling/redness at injection site    Social History:   Social History   Socioeconomic History   Marital status: Widowed    Spouse name: Not on file   Number of children: 2   Years of education: Not on file   Highest education level: Not on file  Occupational History    Employer: RETIRED  Tobacco Use   Smoking status: Never   Smokeless tobacco: Never  Vaping Use   Vaping Use: Never used  Substance and Sexual Activity   Alcohol use: No   Drug use: No   Sexual activity: Not Currently  Other Topics Concern   Not on file  Social History Narrative   Not on file   Social Determinants of Health   Financial Resource Strain: Not on file  Food Insecurity: No Food Insecurity (02/18/2022)   Hunger Vital Sign    Worried About Running Out of Food in the Last Year: Never true    Ran Out of Food in the Last Year: Never true  Transportation Needs: No Transportation Needs (02/18/2022)   PRAPARE - Hydrologist (Medical): No    Lack of Transportation (Non-Medical): No  Physical Activity: Not on file  Stress: Not on file  Social  Connections: Not on file  Intimate Partner Violence: Not on file    Family History:    Family History  Problem Relation Age of Onset   Hypertension Mother    Heart disease Mother        Died in her 58s   Stroke Father    Kidney disease Brother    Heart disease Brother        also HTN, hyperlipidemia  Heart attack Brother    Stroke Sister        x2   Hypertension Sister      ROS:  Please see the history of present illness.   All other ROS reviewed and negative.     Physical Exam/Data:   Vitals:   03/02/22 0115 03/02/22 0150 03/02/22 0515 03/02/22 0815  BP: (!) 138/49 (!) 135/53 (!) 144/49 (!) 142/51  Pulse: 67 69 69 63  Resp: (!) 21 (!) 28 (!) 26 20  Temp:    98.8 F (37.1 C)  TempSrc:      SpO2: 94% 96% 91% 94%  Weight:      Height:       No intake or output data in the 24 hours ending 03/02/22 0955    03/01/2022    5:11 PM 01/01/2022   11:00 AM 11/23/2021    3:05 PM  Last 3 Weights  Weight (lbs) 148 lb 148 lb 148 lb  Weight (kg) 67.132 kg 67.132 kg 67.132 kg     Body mass index is 27.96 kg/m.  General:  Pleasant, elderly female. Laying flat in the bed in no acute distress  HEENT: normal Neck: no JVD Vascular: Bilateral carotid bruits; Radial pulses 2+ bilaterally Cardiac:  normal S1, S2; RRR; grade 4/6 systolic murmur throughout pericardium  Lungs:  clear to auscultation bilaterally, no wheezing, rhonchi or rales. Normal WOB on room air  Abd: soft, nontender, no hepatomegaly  Ext: left lower extremity erythematous and indurated. No edema in right lower extremity  Musculoskeletal:  No deformities, BUE and BLE strength normal and equal Skin: warm and dry  Neuro:  CNs 2-12 intact, no focal abnormalities noted Psych:  Normal affect   EKG:  The EKG was personally reviewed and demonstrates:  Normal sinus rhythm, inverted T waves in leads II, III, aVF, V5 and V6  Telemetry:  Telemetry was personally reviewed and demonstrates:  Normal Sinus rhythm   Relevant  CV Studies:   Laboratory Data:  High Sensitivity Troponin:   Recent Labs  Lab 03/01/22 1743 03/01/22 2033 03/02/22 0215 03/02/22 0341  TROPONINIHS 164* 156* 159* 163*     Chemistry Recent Labs  Lab 03/01/22 1743  NA 140  K 4.3  CL 105  CO2 25  GLUCOSE 126*  BUN 7*  CREATININE 0.85  CALCIUM 9.3  GFRNONAA >60  ANIONGAP 10    Recent Labs  Lab 03/01/22 1743  PROT 6.6  ALBUMIN 2.9*  AST 20  ALT 14  ALKPHOS 71  BILITOT 0.3   Lipids  Recent Labs  Lab 03/02/22 0215  CHOL 169  TRIG 53  HDL 39*  LDLCALC 119*  CHOLHDL 4.3    Hematology Recent Labs  Lab 03/01/22 1743  WBC 7.9  RBC 3.23*  HGB 9.3*  HCT 30.1*  MCV 93.2  MCH 28.8  MCHC 30.9  RDW 13.5  PLT 435*   Thyroid No results for input(s): "TSH", "FREET4" in the last 168 hours.  BNPNo results for input(s): "BNP", "PROBNP" in the last 168 hours.  DDimer No results for input(s): "DDIMER" in the last 168 hours.   Radiology/Studies:  CT Angio Chest PE W and/or Wo Contrast  Result Date: 03/01/2022 CLINICAL DATA:  Chest pain upon deep inspiration. EXAM: CT ANGIOGRAPHY CHEST WITH CONTRAST TECHNIQUE: Multidetector CT imaging of the chest was performed using the standard protocol during bolus administration of intravenous contrast. Multiplanar CT image reconstructions and MIPs were obtained to evaluate the vascular anatomy. RADIATION DOSE REDUCTION: This  exam was performed according to the departmental dose-optimization program which includes automated exposure control, adjustment of the mA and/or kV according to patient size and/or use of iterative reconstruction technique. CONTRAST:  70m OMNIPAQUE IOHEXOL 350 MG/ML SOLN COMPARISON:  December 18, 2021 FINDINGS: Cardiovascular: Stable right-sided venous Port-A-Cath positioning is seen. There is marked severity calcification of the thoracic aorta, without evidence of aortic aneurysm. Satisfactory opacification of the pulmonary arteries to the segmental level. No  evidence of pulmonary embolism. There is mild cardiomegaly with marked severity coronary artery calcification. Marked severity calcification of the mitral annulus is also seen. No pericardial effusion. Mediastinum/Nodes: No enlarged mediastinal, hilar, or axillary lymph nodes. A stable, benign, subcentimeter nodule is seen within the right lobe of the thyroid gland. The trachea and esophagus demonstrate no significant findings. Lungs/Pleura: Mild left basilar atelectasis is seen. A very small left pleural effusion is noted. No pneumothorax is identified. Upper Abdomen: There is a small hiatal hernia. Musculoskeletal: Multiple sternal wires are noted. Degenerative changes seen throughout the thoracic spine. Review of the MIP images confirms the above findings. IMPRESSION: 1. No evidence of pulmonary embolism. 2. Mild left basilar atelectasis. 3. Very small left pleural effusion. 4. Small hiatal hernia. 5. Mild cardiomegaly with marked severity coronary artery calcification. 6. Aortic atherosclerosis. Aortic Atherosclerosis (ICD10-I70.0). Electronically Signed   By: TVirgina NorfolkM.D.   On: 03/01/2022 23:33   DG Chest Port 1 View  Result Date: 03/01/2022 CLINICAL DATA:  Pain with inspiration, chest pain EXAM: PORTABLE CHEST 1 VIEW COMPARISON:  03/01/2022 at 2:39 p.m. FINDINGS: Single frontal view of the chest demonstrates a stable right chest wall port. Postsurgical changes from CABG. The cardiac silhouette is unremarkable. Trace left pleural effusion unchanged. Likely subsegmental atelectasis left lower lobe. No airspace disease or pneumothorax. IMPRESSION: 1. Stable trace left pleural effusion. 2. Minimal left lower lobe subsegmental atelectasis. Electronically Signed   By: MRanda NgoM.D.   On: 03/01/2022 20:05   DG Chest 2 View  Result Date: 03/01/2022 CLINICAL DATA:  Chest pain. EXAM: CHEST - 2 VIEW COMPARISON:  December 18, 2020. FINDINGS: Status post coronary bypass graft. Stable cardiomediastinal  silhouette. Right internal jugular Port-A-Cath is unchanged. Small left pleural effusion is noted. No pneumothorax is noted. Bony thorax is unremarkable. IMPRESSION: Small left pleural effusion. Electronically Signed   By: JMarijo ConceptionM.D.   On: 03/01/2022 17:41     Assessment and Plan:   Chest Pain  Elevated Troponin  - Patient presents complaining of chest pain only on deep inhalation, when laying in certain positions. Denies exertional chest pain, sob.  - hsTn 164>>156>>159>>163. Flat trend is not consistent with ACS  - Patient recently was infected with COVID-19 (about 2-3 weeks ago). Chest pain is pleuritic and positional, I am suspicious for perimyocarditis vs myocarditis  - Check ESR and CRP  - Echocardiogram pending. Further recommendations pending echo   CAD s/p CABG x3 in 2005 - Patient underwent CABG in 2005 with LIMA to LAD, SVG to circumflex, SVG to PDA - Most recent ischemic evaluation was in 2013 with nuclear stress test- showed no signs of ischemia  - As above, hsTn trend is flat which is not consistent with ACS. Chest pain is atypical as it is pleuritic and positional - EKG fairly similar compared to EKG from 10/2021 - Follow echo results  - Continue ASA, zetia, repatha   Familial Hypercholesterolemia  Multidrug intolerance  - Patient followed by Dr. HDebara Pickettfor management - Continue repatha, ezetimibe  Edema of the left lower extremity  - Patient reports this is chronic  - Primary team obtaining venous doppler to rule out DVT   Paroxysmal Atrial Fibrillation  - Maintaining NSR per telemetry  - Continue eliquis, diltiazem   Moderate Aortic Valve Stenosis  - Noted on echocardiogram from 02/2020  - Murmur present on exam  - Echo this admission pending  Risk Assessment/Risk Scores:    CHA2DS2-VASc Score = 7  This indicates a 11.2% annual risk of stroke. The patient's score is based upon: CHF History: 0 HTN History: 1 Diabetes History: 0 Stroke History:  2 Vascular Disease History: 1 Age Score: 2 Gender Score: 1    For questions or updates, please contact [place] Please consult [email or web address] for contact info under    Signed, Margie Billet, PA-C  03/02/2022 9:55 AM As above, patient seen and examined.  Briefly she is an 81 year old female with past medical history of coronary artery disease status post coronary artery bypass and graft, hyperlipidemia, paroxysmal atrial fibrillation, hypertension, prior CVA, hypothyroidism, aortic stenosis for evaluation of chest pain.  Patient has a history of coronary artery bypass and graft.  She had an echocardiogram in October 2021 that showed normal LV function, mild mitral regurgitation and moderate aortic stenosis.  Patient recently had COVID.  Over the past 4 to 5 days she has had pain in her left chest that occurs with inspiration only.  She otherwise denies dyspnea, palpitations or syncope.  She presented to the emergency room and troponin minimally elevated.  Cardiology now asked to evaluate.  CTA showed no pulmonary embolus and small left pleural effusion.  Troponins are 164, 156, 159 and 163.  Hemoglobin 9.3 and white blood cell count 7.9.  Electrocardiogram shows normal sinus rhythm, inferolateral infarct with T wave inversion.  T wave changes slightly more prominent compared to previous.  1 chest pain-symptoms are extremely atypical and not consistent with ischemia.  Troponin is minimally elevated but no clear trend and not consistent with acute coronary syndrome.  Question pleurisy or musculoskeletal pain from recent COVID infection.  It is also possible she could have myocarditis.  We will await echocardiogram results.  If LV function unchanged compared to previous we will not pursue further evaluation.  2 aortic stenosis-moderate on previous echocardiogram.  Would plan repeat study.  This also can be followed as an outpatient.  3 coronary artery disease status post coronary  bypass graft-continue aspirin.  She is intolerant to statins.  4 hyperlipidemia-intolerant to statins.  Continue Zetia and Repatha.  5 paroxysmal atrial fibrillation-she is in sinus rhythm.  Continue Cardizem and apixaban.  6 lower extremity edema-this is lymphedema from previous radiation for colon cancer.  Kirk Ruths, MD

## 2022-03-02 NOTE — ED Notes (Signed)
PATIENT IS UNDRESS IN A GOWN,WALK TO RESTROOM .

## 2022-03-05 DIAGNOSIS — K56609 Unspecified intestinal obstruction, unspecified as to partial versus complete obstruction: Secondary | ICD-10-CM | POA: Diagnosis not present

## 2022-03-05 DIAGNOSIS — Z933 Colostomy status: Secondary | ICD-10-CM | POA: Diagnosis not present

## 2022-03-18 ENCOUNTER — Encounter: Payer: Self-pay | Admitting: Internal Medicine

## 2022-03-18 DIAGNOSIS — D649 Anemia, unspecified: Secondary | ICD-10-CM | POA: Diagnosis not present

## 2022-03-23 ENCOUNTER — Other Ambulatory Visit: Payer: Self-pay | Admitting: Hematology & Oncology

## 2022-03-23 DIAGNOSIS — G8929 Other chronic pain: Secondary | ICD-10-CM

## 2022-03-23 DIAGNOSIS — C541 Malignant neoplasm of endometrium: Secondary | ICD-10-CM

## 2022-03-23 DIAGNOSIS — M545 Low back pain, unspecified: Secondary | ICD-10-CM

## 2022-03-23 DIAGNOSIS — M792 Neuralgia and neuritis, unspecified: Secondary | ICD-10-CM

## 2022-03-24 ENCOUNTER — Encounter: Payer: Self-pay | Admitting: Family

## 2022-03-31 DIAGNOSIS — K56609 Unspecified intestinal obstruction, unspecified as to partial versus complete obstruction: Secondary | ICD-10-CM | POA: Diagnosis not present

## 2022-03-31 DIAGNOSIS — Z933 Colostomy status: Secondary | ICD-10-CM | POA: Diagnosis not present

## 2022-04-01 DIAGNOSIS — K56609 Unspecified intestinal obstruction, unspecified as to partial versus complete obstruction: Secondary | ICD-10-CM | POA: Diagnosis not present

## 2022-04-01 DIAGNOSIS — Z933 Colostomy status: Secondary | ICD-10-CM | POA: Diagnosis not present

## 2022-04-09 DIAGNOSIS — Z933 Colostomy status: Secondary | ICD-10-CM | POA: Diagnosis not present

## 2022-04-09 DIAGNOSIS — K56609 Unspecified intestinal obstruction, unspecified as to partial versus complete obstruction: Secondary | ICD-10-CM | POA: Diagnosis not present

## 2022-04-20 ENCOUNTER — Telehealth: Payer: Self-pay | Admitting: *Deleted

## 2022-04-20 NOTE — Telephone Encounter (Signed)
Per scheduling message Shannon Scott - called patient and lvm of upcoming appointments - requested callback to confirm

## 2022-04-29 ENCOUNTER — Encounter: Payer: Self-pay | Admitting: Medical Oncology

## 2022-04-29 ENCOUNTER — Inpatient Hospital Stay: Payer: Medicare Other | Admitting: Medical Oncology

## 2022-04-29 ENCOUNTER — Inpatient Hospital Stay: Payer: Medicare Other | Attending: Hematology & Oncology

## 2022-04-29 ENCOUNTER — Inpatient Hospital Stay: Payer: Medicare Other

## 2022-04-29 VITALS — BP 152/45 | HR 64 | Temp 97.8°F | Resp 19 | Ht 61.0 in | Wt 149.8 lb

## 2022-04-29 DIAGNOSIS — C541 Malignant neoplasm of endometrium: Secondary | ICD-10-CM

## 2022-04-29 DIAGNOSIS — I872 Venous insufficiency (chronic) (peripheral): Secondary | ICD-10-CM | POA: Diagnosis not present

## 2022-04-29 DIAGNOSIS — C799 Secondary malignant neoplasm of unspecified site: Secondary | ICD-10-CM | POA: Diagnosis not present

## 2022-04-29 DIAGNOSIS — R609 Edema, unspecified: Secondary | ICD-10-CM | POA: Insufficient documentation

## 2022-04-29 DIAGNOSIS — D509 Iron deficiency anemia, unspecified: Secondary | ICD-10-CM | POA: Insufficient documentation

## 2022-04-29 DIAGNOSIS — I890 Lymphedema, not elsewhere classified: Secondary | ICD-10-CM | POA: Insufficient documentation

## 2022-04-29 DIAGNOSIS — C539 Malignant neoplasm of cervix uteri, unspecified: Secondary | ICD-10-CM

## 2022-04-29 DIAGNOSIS — D508 Other iron deficiency anemias: Secondary | ICD-10-CM | POA: Diagnosis not present

## 2022-04-29 DIAGNOSIS — Z95828 Presence of other vascular implants and grafts: Secondary | ICD-10-CM

## 2022-04-29 DIAGNOSIS — D649 Anemia, unspecified: Secondary | ICD-10-CM

## 2022-04-29 LAB — CBC WITH DIFFERENTIAL (CANCER CENTER ONLY)
Abs Immature Granulocytes: 0.07 10*3/uL (ref 0.00–0.07)
Basophils Absolute: 0 10*3/uL (ref 0.0–0.1)
Basophils Relative: 1 %
Eosinophils Absolute: 0.1 10*3/uL (ref 0.0–0.5)
Eosinophils Relative: 1 %
HCT: 33 % — ABNORMAL LOW (ref 36.0–46.0)
Hemoglobin: 10.5 g/dL — ABNORMAL LOW (ref 12.0–15.0)
Immature Granulocytes: 2 %
Lymphocytes Relative: 18 %
Lymphs Abs: 0.9 10*3/uL (ref 0.7–4.0)
MCH: 28.2 pg (ref 26.0–34.0)
MCHC: 31.8 g/dL (ref 30.0–36.0)
MCV: 88.7 fL (ref 80.0–100.0)
Monocytes Absolute: 0.3 10*3/uL (ref 0.1–1.0)
Monocytes Relative: 7 %
Neutro Abs: 3.4 10*3/uL (ref 1.7–7.7)
Neutrophils Relative %: 71 %
Platelet Count: 280 10*3/uL (ref 150–400)
RBC: 3.72 MIL/uL — ABNORMAL LOW (ref 3.87–5.11)
RDW: 15 % (ref 11.5–15.5)
WBC Count: 4.7 10*3/uL (ref 4.0–10.5)
nRBC: 0 % (ref 0.0–0.2)

## 2022-04-29 LAB — CMP (CANCER CENTER ONLY)
ALT: 5 U/L (ref 0–44)
AST: 12 U/L — ABNORMAL LOW (ref 15–41)
Albumin: 4.1 g/dL (ref 3.5–5.0)
Alkaline Phosphatase: 74 U/L (ref 38–126)
Anion gap: 8 (ref 5–15)
BUN: 13 mg/dL (ref 8–23)
CO2: 28 mmol/L (ref 22–32)
Calcium: 9.7 mg/dL (ref 8.9–10.3)
Chloride: 104 mmol/L (ref 98–111)
Creatinine: 0.94 mg/dL (ref 0.44–1.00)
GFR, Estimated: >60 mL/min (ref >60)
Glucose, Bld: 94 mg/dL (ref 70–99)
Potassium: 4 mmol/L (ref 3.5–5.1)
Sodium: 140 mmol/L (ref 135–145)
Total Bilirubin: 0.3 mg/dL (ref 0.3–1.2)
Total Protein: 6.7 g/dL (ref 6.5–8.1)

## 2022-04-29 LAB — RETICULOCYTES
Immature Retic Fract: 7.3 % (ref 2.3–15.9)
RBC.: 3.66 MIL/uL — ABNORMAL LOW (ref 3.87–5.11)
Retic Count, Absolute: 34 10*3/uL (ref 19.0–186.0)
Retic Ct Pct: 0.9 % (ref 0.4–3.1)

## 2022-04-29 LAB — IRON AND IRON BINDING CAPACITY (CC-WL,HP ONLY)
Iron: 18 ug/dL — ABNORMAL LOW (ref 28–170)
Saturation Ratios: 8 % — ABNORMAL LOW (ref 10.4–31.8)
TIBC: 221 ug/dL — ABNORMAL LOW (ref 250–450)
UIBC: 203 ug/dL (ref 148–442)

## 2022-04-29 LAB — FERRITIN: Ferritin: 206 ng/mL (ref 11–307)

## 2022-04-29 LAB — LACTATE DEHYDROGENASE: LDH: 107 U/L (ref 98–192)

## 2022-04-29 MED ORDER — HEPARIN SOD (PORK) LOCK FLUSH 100 UNIT/ML IV SOLN
500.0000 [IU] | Freq: Once | INTRAVENOUS | Status: AC
  Administered 2022-04-29: 500 [IU] via INTRAVENOUS

## 2022-04-29 MED ORDER — SODIUM CHLORIDE 0.9% FLUSH
10.0000 mL | Freq: Once | INTRAVENOUS | Status: AC
  Administered 2022-04-29: 10 mL via INTRAVENOUS

## 2022-04-29 NOTE — Patient Instructions (Signed)

## 2022-04-29 NOTE — Progress Notes (Signed)
Hematology and Oncology Follow Up Visit  Shannon Scott [id number] [birth year] 81 y.o. 04/29/2022   Principle Diagnosis:  Locally advanced/recurrent endometrial carcinoma undifferentiated --  TMB (HIGH) / MSI HIGH  Intermittent iron deficiency anemia   Past Therapy: Radiation therapy for 5 -5 1/2 weeks - s/p 20 fractions Taxol/carboplatinum q 7 days -- s/p cycle 6    Current Therapy:        Pembrolizumab 400 mg q 12 weeks, s/p cycle 20 (originally started on 01/23/2019 at 400 mg IV q 6 wk)  --on hold for patient request since April 2022 Zometa 4 mg IV q 3 months -- next dose due IV iron as indicated --Venofer given 12/02/2021     Interim History:  Shannon Scott is here today for follow-up.  She looks pretty good.  She feels okay.  She still has a lot of lymphedema in the left leg.  Overall she is feeling really well. Has a wonderful Thanksgiving and ate well. No concerns at this time.   No fevers, bleeding episodes. She has had no cough or shortness of breath.  She has had no rashes.  She has little bit of stasis dermatitis in the left lower leg.  There is no fever.  She has had no nausea or vomiting.  Overall, I would say her performance status is ECOG 1.    Wt Readings from Last 3 Encounters:  04/29/22 149 lb 12.8 oz (67.9 kg)  03/01/22 148 lb (67.1 kg)  01/01/22 148 lb (67.1 kg)   Medications:  Allergies as of 04/29/2022       Reactions   Statins Other (See Comments)   Myalgias and memory problems   Ciprofloxacin Itching, Other (See Comments)   Splotchy redness with itching during IV infusion localized to arm. Other reaction(s): itching/swelling/redness at injection site        Medication List        Accurate as of April 29, 2022  1:29 PM. If you have any questions, ask your nurse or doctor.          amoxicillin 500 MG capsule Commonly known as: AMOXIL Take 500 mg by mouth as needed (for dentist appointments).   apixaban 5 MG Tabs tablet Commonly  known as: ELIQUIS Take 1 tablet (5 mg total) by mouth 2 (two) times daily.   ciclopirox 8 % solution Commonly known as: Penlac Apply topically at bedtime. Apply over nail and surrounding skin. Apply daily over previous coat. After seven (7) days, may remove with alcohol and continue cycle. What changed:  how much to take when to take this additional instructions   clobetasol ointment 0.05 % Commonly known as: TEMOVATE Apply 1 application topically 3 (three) times a week. What changed:  when to take this reasons to take this   diltiazem 300 MG 24 hr capsule Commonly known as: TIAZAC TAKE 1 CAPSULE(300 MG) BY MOUTH DAILY What changed: See the new instructions.   ezetimibe 10 MG tablet Commonly known as: ZETIA Take 10 mg by mouth daily.   levothyroxine 50 MCG tablet Commonly known as: SYNTHROID Take 1 tablet (50 mcg total) by mouth daily before breakfast.   mometasone 0.1 % cream Commonly known as: ELOCON Apply 1 Application topically as needed (for flares).   polyethylene glycol 17 g packet Commonly known as: MIRALAX / GLYCOLAX Take 17 g by mouth daily as needed for severe constipation. What changed: when to take this   Repatha SureClick 093 MG/ML Soaj Generic drug: Evolocumab INJECT 1 DOSE(140MG)  UNDER THE SKIN EVERY 14 DAYS What changed: See the new instructions.   traMADol 50 MG tablet Commonly known as: ULTRAM TAKE 2 TABLETS(100 MG) BY MOUTH TWICE DAILY        Allergies:  Allergies  Allergen Reactions   Statins Other (See Comments)    Myalgias and memory problems   Ciprofloxacin Itching and Other (See Comments)    Splotchy redness with itching during IV infusion localized to arm. Other reaction(s): itching/swelling/redness at injection site    Past Medical History, Surgical history, Social history, and Family History were reviewed and updated.  Review of Systems: Review of Systems  Constitutional: Negative.   HENT: Negative.    Eyes: Negative.    Respiratory: Negative.    Cardiovascular:  Positive for leg swelling.  Gastrointestinal: Negative.   Genitourinary: Negative.   Musculoskeletal:  Positive for back pain.  Skin: Negative.   Neurological: Negative.   Endo/Heme/Allergies: Negative.   Psychiatric/Behavioral: Negative.       Physical Exam:  height is _0  (1.549 m) and weight is 149 lb 12.8 oz (67.9 kg). Her oral temperature is 97.8 F (36.6 C). Her blood pressure is 152/45 (abnormal) and her pulse is 64. Her respiration is 19 and oxygen saturation is 99%.   Wt Readings from Last 3 Encounters:  04/29/22 149 lb 12.8 oz (67.9 kg)  03/01/22 148 lb (67.1 kg)  01/01/22 148 lb (67.1 kg)    Physical Exam Vitals reviewed.  HENT:     Head: Normocephalic and atraumatic.  Eyes:     Pupils: Pupils are equal, round, and reactive to light.  Cardiovascular:     Rate and Rhythm: Normal rate and regular rhythm.     Heart sounds: Normal heart sounds.     Comments: Cardiac exam shows a regular rate and rhythm.  She has a 1/6 systolic ejection murmur.   Pulmonary:     Effort: Pulmonary effort is normal.     Breath sounds: Normal breath sounds.  Abdominal:     General: Bowel sounds are normal.     Palpations: Abdomen is soft.     Comments: Abdominal exam shows a soft abdomen.  She has a colostomy that is intact.  She has no fluid wave.  There is no obvious abdominal mass.  There is no palpable liver or spleen tip.  Musculoskeletal:        General: No tenderness or deformity. Normal range of motion.     Cervical back: Normal range of motion.     Comments: She has significant lymphedema in her legs.  She has probably 3+ in the left leg and 2+ in the right leg.  I do not see any erythema.  She has pitting edema.  She has decent range of motion of her joints.  Lymphadenopathy:     Cervical: No cervical adenopathy.  Skin:    General: Skin is warm and dry.     Findings: No erythema or rash.  Neurological:     Mental Status: She  is alert and oriented to person, place, and time.  Psychiatric:        Behavior: Behavior normal.        Thought Content: Thought content normal.        Judgment: Judgment normal.      Lab Results  Component Value Date   WBC 4.7 04/29/2022   HGB 10.5 (L) 04/29/2022   HCT 33.0 (L) 04/29/2022   MCV 88.7 04/29/2022   PLT 280 04/29/2022  Lab Results  Component Value Date   FERRITIN 216 01/01/2022   IRON 42 01/01/2022   TIBC 206 (L) 01/01/2022   UIBC 164 01/01/2022   IRONPCTSAT 20 01/01/2022   Lab Results  Component Value Date   RETICCTPCT 0.9 04/29/2022   RBC 3.72 (L) 04/29/2022   RBC 3.66 (L) 04/29/2022   No results found for: "KPAFRELGTCHN", "LAMBDASER", "KAPLAMBRATIO" No results found for: "IGGSERUM", "IGA", "IGMSERUM" No results found for: "TOTALPROTELP", "ALBUMINELP", "A1GS", "A2GS", "BETS", "BETA2SER", "GAMS", "MSPIKE", "SPEI"   Chemistry      Component Value Date/Time   NA 140 04/29/2022 1256   NA 136 (A) 09/08/2017 0000   K 4.0 04/29/2022 1256   CL 104 04/29/2022 1256   CO2 28 04/29/2022 1256   BUN 13 04/29/2022 1256   BUN 8 09/08/2017 0000   CREATININE 0.94 04/29/2022 1256   CREATININE 0.69 08/02/2016 1519   GLU 111 09/08/2017 0000      Component Value Date/Time   CALCIUM 9.7 04/29/2022 1256   ALKPHOS 74 04/29/2022 1256   AST 12 (L) 04/29/2022 1256   ALT 5 04/29/2022 1256   BILITOT 0.3 04/29/2022 1256       Impression and Plan: Ms. Whitelock is a very pleasant 81 yo caucasian female with recurrent undifferentiated endometrial carcinoma.   She was on immunotherapy.  She has a high MSI.  She really has done well with immunotherapy.  The recent CT scan confirms this.  Today her counts look good. She is feeling well.   Reviewed lab work from today which is stable.   Disposition: Zometa ASAP Port flushes every 6 weeks CT scan in roughly 1 month RTC 4 months MD, labs [place], PA-C 11/30/20231:29 PM

## 2022-04-30 ENCOUNTER — Telehealth: Payer: Self-pay | Admitting: *Deleted

## 2022-04-30 NOTE — Telephone Encounter (Signed)
Unable to reach pt, lmovm with iron results and to expect a call from scheduling for IV Iron infusion in next 2 weeks. Msg to scheduling

## 2022-04-30 NOTE — Telephone Encounter (Signed)
-----   Message from Volanda Napoleon, MD sent at 04/29/2022  4:49 PM EST ----- Call and let her know that the iron is quite low.  She does need to have some IV iron.  Please see if she will be able to come in for IV iron in the next week or 2.  Thanks.  Laurey Arrow

## 2022-05-03 ENCOUNTER — Encounter: Payer: Self-pay | Admitting: Internal Medicine

## 2022-05-03 ENCOUNTER — Ambulatory Visit: Payer: Medicare Other | Attending: Internal Medicine | Admitting: Internal Medicine

## 2022-05-03 VITALS — BP 142/60 | HR 71 | Wt 149.4 lb

## 2022-05-03 DIAGNOSIS — E7801 Homozygous familial hypercholesterolemia (hofh): Secondary | ICD-10-CM

## 2022-05-03 DIAGNOSIS — E785 Hyperlipidemia, unspecified: Secondary | ICD-10-CM | POA: Diagnosis not present

## 2022-05-03 DIAGNOSIS — R600 Localized edema: Secondary | ICD-10-CM

## 2022-05-03 DIAGNOSIS — Z8673 Personal history of transient ischemic attack (TIA), and cerebral infarction without residual deficits: Secondary | ICD-10-CM

## 2022-05-03 DIAGNOSIS — M791 Myalgia, unspecified site: Secondary | ICD-10-CM | POA: Diagnosis not present

## 2022-05-03 DIAGNOSIS — I480 Paroxysmal atrial fibrillation: Secondary | ICD-10-CM | POA: Diagnosis not present

## 2022-05-03 DIAGNOSIS — Z951 Presence of aortocoronary bypass graft: Secondary | ICD-10-CM | POA: Diagnosis not present

## 2022-05-03 DIAGNOSIS — T466X5D Adverse effect of antihyperlipidemic and antiarteriosclerotic drugs, subsequent encounter: Secondary | ICD-10-CM

## 2022-05-03 MED ORDER — FUROSEMIDE 20 MG PO TABS: 20.0000 mg | ORAL_TABLET | Freq: Every day | ORAL | 1 refills | Status: DC

## 2022-05-03 NOTE — Progress Notes (Signed)
OFFICE NOTE  Chief Complaint:  Follow-up hospitalization  Primary Care Physician: Leeroy Cha, MD  HPI:  Shannon Scott is a 81 year old female with severe combined dyslipidemia with CABG in 2005 who has been intolerant to statins, Welchol, niacin, Zetia, and other cholesterol-lower medications. In the past I had sent her to Dr. Moshe Cipro at [facility], who is a lipidologist, for evaluation for apheresis. However, due to the cost of close to $1000 a month she felt that was intolerable. Recently she was complaining of neck pain, underwent carotid Dopplers that you ordered, and those demonstrated 40% to 59% right internal carotid stenosis and a 60% to 79% left internal carotid stenosis. She does have a left carotid bruit. In addition, she is describing a band-like pressure around her chest which she attributed to her blood pressure medicine and then had actually discontinued that; however, the symptoms still persist. This is a little bit worse with exertion, does relieve with rest or taking blood pressure medicine, and is concerning for angina. She's also complaining of some left-sided numbness facial tingling which could be TIA. She has not been taking aspirin and she reports developing what sounds like petechiae on that, but is also not willing to take Plavix or other antiplatelet medications. She does also report pain center called for her body and her shoulders and her knees and her legs as well.  Shannon Scott returns today for follow-up. It's been about a year and a half since I seen her. She does have a history of multivessel coronary disease and prior bypass in 2005. Her last stress test was in 2013 which was negative for ischemia. An echo in 2008 showed probable mild aortic stenosis. She does have a murmur of aortic stenosis today which may be a little louder. Blood pressure is also poorly controlled today however she said she did not take her medicine. She says that she is taking only a  half of her herbs certain pill as it causes her more swelling if she takes the whole pill, despite the fact that contains a diuretic. She's been very intolerant to almost all medications including all of the statins and Zetia. No medicines of been tolerated and she continues to have a very high cholesterol over 300. I do think she be a good candidate for a PC SK 9 inhibitor.  Shannon Scott returns today for follow-up. She is recently started on BiDil and report some mild improvement in blood pressure although she feels when her blood pressure gets low that she is symptomatic. She may have some degree of lack of cerebral autoregulation due to long-standing hypertension. Unfortunate she's been intolerant to all medications. She's been tried on all of the statin medications, WelChol, niacin and steady and has been intolerant. I referred her to [facility] in 2013 for apheresis, however due to the very high cost this was never pursued. With the recent approval of PC SK 9 inhibitors, I think this would be a very good option for her. She has known ASCVD and FH based on her lipid profile. Her most recent cholesterol demonstrated total cholesterol 401, triglycerides 91, HDL 50, and LDL 333. Based on these findings her Dutch score is 8.  08/02/2016  Shannon Scott returns today for follow-up. Although she is asymptomatic, she still has significant untreated risk factors. Most notably she likely has FH. Her brother died recently suddenly, which is suspicious for cardiovascular disease. She does have 2 children, however who apparently do not have any significant cholesterol abnormalities. She does  also have a parent to had premature coronary artery disease. Her most recent LDL cholesterol was over 300 but has not been checked in a number of years. She's been intolerant to all statins, Zetia and WelChol. She previously been referred for lipoprotein apheresis however did not make that appointment due to the  cost.  11/18/2016  Shannon Scott was seen today again in follow-up. She underwent recent lipid testing which again is consistent with either FH or possibly an autosomal recessive LDL receptor defect or gain of function mutation and PCSK9. LDL particle numbers remain elevated at 2191, despite this she has an A pattern. Total cholesterol 333 with LDL-C 257 and HDL-C 56. Non-HDL cholesterol of 277. She is speaking with our pharmacist about starting Redpath but reports that she is not interested in medication. She also says she does not think she can afford the medication and can barely pay for house meds. Blood pressure remains not at goal today. She reports pain short of breath and having lower extremity swelling. Recently doxazosin was added at night - but she's not noted any significant difference in her blood pressure.  06/03/2017  Shannon Scott returns today for follow-up of hypertension and dyslipidemia.  As previously mentioned she has FH with significantly elevated LDL greater than 250 -given her premature CAD, family history of early CAD, very high LDL, apo B and LpA numbers, she may have HoFH.  She was approved for treatment with Repatha, however is afraid of needles and is not interested in that therapy.  There is potentially one other option for her and that would be Juxtapid. Clinical profile certainly fits HoFH.  This is the only oral therapy on the market for familial hyperlipidemia.  I believe this certainly could be an option for her, however it has its own side effect profile.  We will certainly need to monitor her liver function closely as specified through the [facility] proram.  Blood pressure is again noted to be elevated today.  She says this may be related to problems with a left hip bursitis.   09/18/2019  Shannon Scott returns today for follow-up.  Overall she is without any significant complaints.  She has continued to struggle with cancer and is still undergoing some chemotherapy.  She ultimately had to  have colostomy and is not clear whether that would be reversed.  She has been uninterested in any lipid lowering medications particular PCSK9 inhibitors and is been statin intolerant.  We discussed some other options however she was hesitant to consider them.  Today we also discussed the possibility of enrolling her in a clinical trial.  She may very well qualify for [facility] for, which is a study of inclisiran, a short interfering RNA to PCSK9.   09/04/2020  Shannon Scott is seen today in follow-up.  In the interim she had been seen by Almyra Deforest, PA-C.  They discussed starting Repatha and she was agreeable to it.  Fortunately she has tolerated it well.  She has not had her lipids rechecked but she has had at least 6 doses of the medication.  She does have upcoming labs drawn from her port through the cancer center.  She continues to undergo chemotherapy for metastatic uterine cancer.  Cholesterol has been very high with LDL in the 300s.  She also has a history of coronary disease and prior bypass.  11/23/2021  Shannon Scott returns today for follow-up.  She reports compliance with the PCSK9 inhibitor and ezetimibe.  She has not had recent lipids however.  I would like to check it today although she is nonfasting.  I think this will help with compliance.  We talked about other options although they are somewhat limited.  She probably has a compound heterozygote with regards to her lipids and could potentially get Evkeeza.  We discussed that again today however I think it is unlikely she will want to follow through with that.  She does have a port which would allow monthly home infusions.  The other option might be to add bempedoic acid if her cholesterol still remains high  05/03/2022  Shannon Scott is seen today for follow-up.  She was hospitalized in October for chest pain.  It was not felt to be cardiac.  A repeat echo showed stable moderate aortic stenosis.  She is also had more left than right lower extremity  swelling.  This is felt to be due to lymphedema.  She is currently using a lymphedema pump at home.  The swelling is not improved significantly.  She did have Dopplers in the hospital which was negative for DVT.  Recent lipid showed total cholesterol 169, triglycerides 53, HDL 39 and LDL 119, this on Repatha and ezetimibe.  Her target LDL is less than 70.  We discussed possibility of adding Nexletol however she is hesitant at this time.  She remains on Eliquis and has no bleeding issues with that.  PMHx:  Past Medical History:  Diagnosis Date   A-fib ([place]) 03/26/2020   Acute CVA (cerebrovascular accident) ([place]) 03/26/2020   Arthritis    Asthma    allergy induced   Bilateral carotid artery disease (HCC)    L carotid bruit   Bursitis    left hip   CAD (coronary artery disease)    Cancer (HCC)    Dyslipidemia    intolerant to statins, welchol, niacin, zetia   Goals of care, counseling/discussion 10/11/2017   History of blood transfusion    History of nuclear stress test 04/24/2012   lexiscan; normal study   Hypertension    Hypothyroidism    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 ([place]) 10/11/2017   Postoperative nausea and vomiting 01/02/2016    Past Surgical History:  Procedure Laterality Date   ABDOMINAL HYSTERECTOMY     Carotid Doppler  02/2012   40-59% right int carotid artery stenosis; 60-79% L int carotid stenosis; L carotid bruit   CORONARY ARTERY BYPASS GRAFT  03/12/2004   LIMA to LAD, SVG to circumflex, SVG to PDA   CYSTOSCOPY W/ URETERAL STENT PLACEMENT Left 12/06/2017   Procedure: CYSTOSCOPY WITH LEFT URETERAL STENT EXCHANGE;  Surgeon: Irine Seal, MD;  Location: [facility];  Service: Urology;  Laterality: Left;   CYSTOSCOPY WITH STENT PLACEMENT Bilateral 08/21/2017   Procedure: CYSTOSCOPY WITH STENT PLACEMENT;  Surgeon: Ceasar Mons, MD;  Location: [facility];  Service: Urology;  Laterality: Bilateral;   IR FLUORO GUIDE PORT  INSERTION RIGHT  10/11/2017   IR GENERIC HISTORICAL  04/29/2016   IR RADIOLOGIST EVAL & MGMT 04/29/2016 Sandi Mariscal, MD [facility] INTERV RAD   IR GENERIC HISTORICAL  05/12/2016   IR RADIOLOGIST EVAL & MGMT 05/12/2016 Sandi Mariscal, MD [facility] INTERV RAD   IR IVC FILTER PLMT / S&I Burke Keels GUID/MOD SED  10/11/2017   IR US GUIDE VASC ACCESS RIGHT  10/11/2017   ROBOTIC ASSISTED TOTAL HYSTERECTOMY WITH BILATERAL SALPINGO OOPHERECTOMY Bilateral 12/30/2015   Procedure: XI ROBOTIC ASSISTED TOTAL HYSTERECTOMY WITH BILATERAL SALPINGO OOPHORECTOMY WITH SENTAL LYMPH NODE BIOPSY;  Surgeon: Imagene Gurney  Alycia Rossetti, MD;  Location: [facility];  Service: Gynecology;  Laterality: Bilateral;   TONSILLECTOMY     TRANSTHORACIC ECHOCARDIOGRAM  04/2007   EF>55%; mild MR; mild-mod TR; mild pulm HTN; mild calcification of aortiv valve leaflets with mild valvular aortic stenosis   TUBAL LIGATION      FAMHx:  Family History  Problem Relation Age of Onset   Hypertension Mother    Heart disease Mother        Died in her 66s   Stroke Father    Kidney disease Brother    Heart disease Brother        also HTN, hyperlipidemia   Heart attack Brother    Stroke Sister        x2   Hypertension Sister     SOCHx:   reports that she has never smoked. She has never used smokeless tobacco. She reports that she does not drink alcohol and does not use drugs.  ALLERGIES:  Allergies  Allergen Reactions   Statins Other (See Comments)    Myalgias and memory problems   Ciprofloxacin Itching and Other (See Comments)    Splotchy redness with itching during IV infusion localized to arm. Other reaction(s): itching/swelling/redness at injection site    ROS: Pertinent items noted in HPI and remainder of comprehensive ROS otherwise negative.  HOME MEDS: Current Outpatient Medications  Medication Sig Dispense Refill   amoxicillin (AMOXIL) 500 MG capsule Take 500 mg by mouth as needed (for dentist appointments).     apixaban (ELIQUIS) 5 MG TABS tablet  Take 1 tablet (5 mg total) by mouth 2 (two) times daily. 60 tablet 0   ciclopirox (PENLAC) 8 % solution Apply topically at bedtime. Apply over nail and surrounding skin. Apply daily over previous coat. After seven (7) days, may remove with alcohol and continue cycle. (Patient taking differently: Apply 1 application  topically See admin instructions. Apply over nail and surrounding skin as needed. Apply daily over previous coat. After seven (7) days, may remove with alcohol and continue cycle.) 6.6 mL 2   clobetasol ointment (TEMOVATE) 8.76 % Apply 1 application topically 3 (three) times a week. (Patient taking differently: Apply 1 application  topically as needed (for flares).) 30 g 0   diltiazem (TIAZAC) 300 MG 24 hr capsule TAKE 1 CAPSULE(300 MG) BY MOUTH DAILY (Patient taking differently: Take 300 mg by mouth at bedtime.) 30 capsule 1   Evolocumab (REPATHA SURECLICK) 811 MG/ML SOAJ INJECT 1 DOSE(140MG) UNDER THE SKIN EVERY 14 DAYS (Patient taking differently: Inject 140 mg into the skin every 14 (fourteen) days.) 6 mL 3   ezetimibe (ZETIA) 10 MG tablet Take 10 mg by mouth daily.     levothyroxine (SYNTHROID, LEVOTHROID) 50 MCG tablet Take 1 tablet (50 mcg total) by mouth daily before breakfast. 30 tablet 1   mometasone (ELOCON) 0.1 % cream Apply 1 Application topically as needed (for flares).     polyethylene glycol (MIRALAX / GLYCOLAX) 17 g packet Take 17 g by mouth daily as needed for severe constipation. (Patient taking differently: Take 17 g by mouth daily.) 14 each 0   traMADol (ULTRAM) 50 MG tablet TAKE 2 TABLETS(100 MG) BY MOUTH TWICE DAILY 120 tablet 0   No current facility-administered medications for this visit.    LABS/IMAGING: No results found for this or any previous visit (from the past 48 hour(s)). No results found.  VITALS: BP (!) 142/60 (BP Location: Left Arm, Patient Position: Sitting, Cuff Size: Normal)   Pulse 71  Wt 149 lb 6.4 oz (67.8 kg)   SpO2 98%   BMI 28.23 kg/m    EXAM: General appearance: alert and no distress Neck: no carotid bruit, no JVD, and thyroid not enlarged, symmetric, no tenderness/mass/nodules Lungs: clear to auscultation bilaterally Heart: regular rate and rhythm, S1, S2 normal, and systolic murmur: late systolic 3/6, crescendo at 2nd right intercostal space Abdomen: soft, non-tender; bowel sounds normal; no masses,  no organomegaly Extremities: edema 2-3+ left lower extremity Pulses: 2+ and symmetric Skin: Skin color, texture, turgor normal. No rashes or lesions Neurologic: Grossly normal Psych: Pleasant  EKG: Normal sinus rhythm at 71-personally reviewed  ASSESSMENT: Familial hypercholesterolemia (possibly HoFH) with a [place] score of 8, also consider LDL adapter protein deficiency or GOF mutation in PCSK9 History of TIAs Multidrug intolerance - including statins, welchol, zetia, etc, but also less than expected response to therapy  3.   CABG x 3 (2005) 4.   Known moderate to severe bilateral carotid artery disease  5.   Uncontrolled hypertension 6.   Aortic stenosis (LPa - 123), ApoB of 175 7.  Advanced and recurrent endometrial cancer 8.  Bowel obstruction status post ostomy  PLAN: 1.   [name] is doing well without any recurrent chest pain since she was hospitalized.  This was after she had COVID and may have been related.  Her aortic stenosis is still moderate will need to be repeated by echo next year.  I think her cholesterol still well above target LDL less than 70.  We discussed Nexletol but she is hesitant at this time.  She is concerned about left lower extremity swelling which has been an issue for several months.  She is being treated with a lymphedema pump.  Will try low-dose Lasix 20 mg daily.  She will need a repeat metabolic profile in a couple weeks.  As she has an FH, she may very well have an elevated LP(a).  I think this is of interest to establish especially with new medications and development.  Will  check this as well.  Follow-up with me in 6 months or sooner as necessary.  Pixie Casino, MD, [facility] [place] Director of the Advanced Lipid Disorders &  Cardiovascular Risk Reduction Clinic Diplomate of the American Board of Clinical Lipidology Attending Cardiologist  Direct Dial: [phone]  Fax: [phone]  Website:  [email or web address] 05/03/2022, 2:29 PM

## 2022-05-03 NOTE — Patient Instructions (Signed)
Medication Instructions:  START: LASIX (FUROSEMIDE) 16m ONCE DAILY  *If you need a refill on your cardiac medications before your next appointment, please call your pharmacy*  Lab Work:  Please return for Blood Work in [place]. No appointment needed, lab here at the office is open Monday-Friday from 8AM to 4PM and closed daily for lunch from 12:45-1:45.   If you have labs (blood work) drawn today and your tests are completely normal, you will receive your results only by: [facility](if you have MyChart) OR A paper copy in the mail If you have any lab test that is abnormal or we need to change your treatment, we will call you to review the results.  Testing/Procedures: None Ordered At This Time.   Follow-Up: [facility] you and your health needs are our priority.  As part of our continuing mission to provide you with exceptional heart care, we have created designated Provider Care Teams.  These Care Teams include your primary Cardiologist (physician) and Advanced Practice Providers (APPs -  Physician Assistants and Nurse Practitioners) who all work together to provide you with the care you need, when you need it.  Your next appointment:   6 month(s)  The format for your next appointment:   In Person  Provider:   KPixie Casino MD     PLEASE LET [place] IF YOU WOULD LIKE TO START NEXLETOL

## 2022-05-04 ENCOUNTER — Encounter: Payer: Self-pay | Admitting: Hematology & Oncology

## 2022-05-06 DIAGNOSIS — Z933 Colostomy status: Secondary | ICD-10-CM | POA: Diagnosis not present

## 2022-05-06 DIAGNOSIS — K56609 Unspecified intestinal obstruction, unspecified as to partial versus complete obstruction: Secondary | ICD-10-CM | POA: Diagnosis not present

## 2022-05-07 ENCOUNTER — Inpatient Hospital Stay: Payer: Medicare Other | Attending: Hematology & Oncology

## 2022-05-07 VITALS — BP 157/47 | HR 64 | Temp 97.9°F | Resp 18

## 2022-05-07 DIAGNOSIS — D509 Iron deficiency anemia, unspecified: Secondary | ICD-10-CM | POA: Diagnosis not present

## 2022-05-07 DIAGNOSIS — D508 Other iron deficiency anemias: Secondary | ICD-10-CM

## 2022-05-07 DIAGNOSIS — Z79899 Other long term (current) drug therapy: Secondary | ICD-10-CM | POA: Insufficient documentation

## 2022-05-07 MED ORDER — HEPARIN SOD (PORK) LOCK FLUSH 100 UNIT/ML IV SOLN
250.0000 [IU] | Freq: Once | INTRAVENOUS | Status: AC | PRN
  Administered 2022-05-07: 500 [IU]

## 2022-05-07 MED ORDER — SODIUM CHLORIDE 0.9 % IV SOLN
200.0000 mg | Freq: Once | INTRAVENOUS | Status: AC
  Administered 2022-05-07: 200 mg via INTRAVENOUS
  Filled 2022-05-07: qty 200

## 2022-05-07 MED ORDER — SODIUM CHLORIDE 0.9 % IV SOLN: Freq: Once | INTRAVENOUS | Status: AC

## 2022-05-07 MED ORDER — SODIUM CHLORIDE 0.9% FLUSH
10.0000 mL | Freq: Once | INTRAVENOUS | Status: AC | PRN
  Administered 2022-05-07: 10 mL

## 2022-05-07 NOTE — Patient Instructions (Signed)

## 2022-05-11 DIAGNOSIS — K56609 Unspecified intestinal obstruction, unspecified as to partial versus complete obstruction: Secondary | ICD-10-CM | POA: Diagnosis not present

## 2022-05-11 DIAGNOSIS — Z933 Colostomy status: Secondary | ICD-10-CM | POA: Diagnosis not present

## 2022-05-13 ENCOUNTER — Other Ambulatory Visit: Payer: Self-pay | Admitting: Hematology & Oncology

## 2022-05-13 ENCOUNTER — Inpatient Hospital Stay: Payer: Medicare Other

## 2022-05-13 VITALS — BP 154/58 | HR 68 | Temp 97.9°F | Resp 17

## 2022-05-13 DIAGNOSIS — M792 Neuralgia and neuritis, unspecified: Secondary | ICD-10-CM

## 2022-05-13 DIAGNOSIS — D508 Other iron deficiency anemias: Secondary | ICD-10-CM

## 2022-05-13 DIAGNOSIS — Z79899 Other long term (current) drug therapy: Secondary | ICD-10-CM | POA: Diagnosis not present

## 2022-05-13 DIAGNOSIS — C541 Malignant neoplasm of endometrium: Secondary | ICD-10-CM

## 2022-05-13 DIAGNOSIS — D509 Iron deficiency anemia, unspecified: Secondary | ICD-10-CM | POA: Diagnosis not present

## 2022-05-13 DIAGNOSIS — G8929 Other chronic pain: Secondary | ICD-10-CM

## 2022-05-13 DIAGNOSIS — M545 Low back pain, unspecified: Secondary | ICD-10-CM

## 2022-05-13 MED ORDER — HEPARIN SOD (PORK) LOCK FLUSH 100 UNIT/ML IV SOLN
500.0000 [IU] | Freq: Once | INTRAVENOUS | Status: AC
  Administered 2022-05-13: 500 [IU] via INTRAVENOUS

## 2022-05-13 MED ORDER — SODIUM CHLORIDE 0.9 % IV SOLN
200.0000 mg | Freq: Once | INTRAVENOUS | Status: AC
  Administered 2022-05-13: 200 mg via INTRAVENOUS
  Filled 2022-05-13: qty 200

## 2022-05-13 MED ORDER — SODIUM CHLORIDE 0.9 % IV SOLN: INTRAVENOUS | Status: DC

## 2022-05-13 MED ORDER — SODIUM CHLORIDE 0.9% FLUSH
10.0000 mL | Freq: Once | INTRAVENOUS | Status: AC
  Administered 2022-05-13: 10 mL via INTRAVENOUS

## 2022-05-13 NOTE — Patient Instructions (Signed)

## 2022-05-18 ENCOUNTER — Other Ambulatory Visit: Payer: Self-pay | Admitting: Gynecologic Oncology

## 2022-05-18 ENCOUNTER — Encounter: Payer: Self-pay | Admitting: Gynecologic Oncology

## 2022-05-18 ENCOUNTER — Telehealth: Payer: Self-pay | Admitting: Surgery

## 2022-05-18 ENCOUNTER — Telehealth: Payer: Self-pay | Admitting: Internal Medicine

## 2022-05-18 DIAGNOSIS — N9089 Other specified noninflammatory disorders of vulva and perineum: Secondary | ICD-10-CM

## 2022-05-18 MED ORDER — MOMETASONE FUROATE 0.1 % EX CREA: 1.0000 | TOPICAL_CREAM | CUTANEOUS | 0 refills | Status: DC | PRN

## 2022-05-18 MED ORDER — MOMETASONE FUROATE 0.1 % EX CREA: 1.0000 | TOPICAL_CREAM | Freq: Every day | CUTANEOUS | 0 refills | Status: DC | PRN

## 2022-05-18 NOTE — Telephone Encounter (Signed)
Called patient to verify which cream she would like to be reordered for her, since she has clobetasol listed as well. Patient confirmed she would like mometasone ordered, as that has worked for her before. Patient states she is not using clobetasol at this time.

## 2022-05-18 NOTE — Telephone Encounter (Signed)
PA for repatha submitted via [place] (Key: [id number])

## 2022-05-20 NOTE — Telephone Encounter (Signed)
Request Reference Number: [id number]. REPATHA SURE INJ 140MG/ML is approved through 05/31/2023. Your patient may now fill this prescription and it will be covered.

## 2022-05-27 ENCOUNTER — Ambulatory Visit (HOSPITAL_BASED_OUTPATIENT_CLINIC_OR_DEPARTMENT_OTHER): Payer: Medicare Other

## 2022-06-07 DIAGNOSIS — Z933 Colostomy status: Secondary | ICD-10-CM | POA: Diagnosis not present

## 2022-06-07 DIAGNOSIS — K56609 Unspecified intestinal obstruction, unspecified as to partial versus complete obstruction: Secondary | ICD-10-CM | POA: Diagnosis not present

## 2022-06-09 ENCOUNTER — Telehealth: Payer: Self-pay

## 2022-06-09 NOTE — Telephone Encounter (Signed)
Pt called to make an appointment with Dr. Berline Lopes. She was last seen 08/2021. She did not call back in October to schedule an appointment for 6 month follow up. Next available for Berline Lopes is at the end of March.  Pt states she is having vaginal irritation (on the outside). It started this week. No bleeding just redness and real tender to touch and sit down. She states she has tried using the Elocon cream which seems to make it worse, the Clobetasol ointment helps a little. She wants an earlier appointment stating Berline Lopes is her only GYN.  Pt is scheduled with Dr. Delsa Sale for 06/23/22.  [name] NP, and Dr. Berline Lopes notified.

## 2022-06-09 NOTE — Telephone Encounter (Signed)
Pt is scheduled on 09/03/22 @ 1:45 Per Dr. Berline Lopes for follow up appointment.

## 2022-06-21 ENCOUNTER — Other Ambulatory Visit: Payer: Self-pay | Admitting: Internal Medicine

## 2022-06-21 DIAGNOSIS — Z951 Presence of aortocoronary bypass graft: Secondary | ICD-10-CM

## 2022-06-21 DIAGNOSIS — E785 Hyperlipidemia, unspecified: Secondary | ICD-10-CM

## 2022-06-23 ENCOUNTER — Other Ambulatory Visit: Payer: Self-pay

## 2022-06-23 ENCOUNTER — Inpatient Hospital Stay: Payer: Medicare Other | Attending: Hematology & Oncology | Admitting: Obstetrics & Gynecology

## 2022-06-23 ENCOUNTER — Encounter: Payer: Self-pay | Admitting: Obstetrics & Gynecology

## 2022-06-23 VITALS — BP 178/64 | HR 76 | Temp 98.7°F | Resp 18 | Wt 147.2 lb

## 2022-06-23 DIAGNOSIS — Z8542 Personal history of malignant neoplasm of other parts of uterus: Secondary | ICD-10-CM

## 2022-06-23 DIAGNOSIS — N9089 Other specified noninflammatory disorders of vulva and perineum: Secondary | ICD-10-CM

## 2022-06-23 DIAGNOSIS — C541 Malignant neoplasm of endometrium: Secondary | ICD-10-CM

## 2022-06-23 DIAGNOSIS — L309 Dermatitis, unspecified: Secondary | ICD-10-CM

## 2022-06-23 LAB — WET PREP, GENITAL
Sperm: NONE SEEN
Trich, Wet Prep: NONE SEEN
WBC, Wet Prep HPF POC: <10 (ref <10)
Yeast Wet Prep HPF POC: NONE SEEN

## 2022-06-23 MED ORDER — CLOBETASOL PROPIONATE 0.05 % EX OINT: 1.0000 | TOPICAL_OINTMENT | CUTANEOUS | 3 refills | Status: DC

## 2022-06-23 NOTE — Patient Instructions (Addendum)
  Return in 8/24. Please call our office in the summer of 2024 at [phone] to arrange for an appointment for August.  Use vaseline as a barrier on the skin on the vulva at least twice a day. After bathing, apply two swipes of clobetasol every night for one month, then every other night the next month, then twice weekly.  Continue to use the peri bottle to rinse the skin on the vulva after toileting.   Healthy vulval hygiene practices Avoid Substitute  Clothing  Pantyhose Stockings with a garter belt Thigh-high or knee-high stockings   Synthetic underwear Cotton underwear or no underwear  Jeans and other tight pants Loose pants, skirts, dresses  Swimsuits, leotards, thongs, lycra garments Loose-fitting cotton garments  Cleansing products  Scented soaps or shampoos Fragrance-free pH neutral soap  Bubble bath Tub baths in the morning and at night without additives and at a comfortable temperature  Scented detergents Unscented detergents  Baby wipes or flushable wipes Rinse with water using sports water bottle or perineal irrigation bottle  Feminine sprays, douches, powders These are not necessary products and can be omitted from personal practices  Other  Washcloths Use fingertips for washing; pat dry, do not rub dry  Panty liners Tampons or cotton pads  Dyed toilet articles Toilet articles without dyes  Hair dryers to dry vulva skin without contact Dry vulva by gentle patting  Vulvar/Vaginal Moisturizers  Moisturizer Options: Vitamin E oil: pump or capsule form Vitamin E cream (Gene's vitamin E cream) Coconut oil: bottle or bead form Shea butter Blossom Organic Lubricant (organic and all natural; www.blossomorganics.com) PE suppository(coconut oil/vitamin E/palm oil) [place] [employer]vest Aloe Glide      Consider the ingredients of the product - the fewer the ingredients the better!  Directions for Use: Clean and dry your hands Gently dab the vulvar/vaginal area dry as  needed Apply a "pea-sized" amount of the moisturizer onto your fingertip Using you other hand, open the labia   Apply the moisturizer to the vulvar/vaginal tissues Wear loose fitting underwear/clothing if possible following application  Use moisturize 2-3 times daily as desired.

## 2022-06-23 NOTE — Assessment & Plan Note (Addendum)
Shannon Scott is a 82 y.o. with recurrent endometrial cancer, Imaging continues to be negative for any evidence of disease.  Previously on pembrolizumab for maintenance after recurrence.  Patient is overall doing well from a cancer standpoint and is NED.  Marland Kitchen   Continue q 6 month follow-up; return to this clinic in 8/24.

## 2022-06-23 NOTE — Progress Notes (Signed)
Follow Up Note: Gyn-Onc  Shannon Scott 82 y.o. female  CC: She presents for a follow-up visit   HPI: The oncology history was reviewed.  Interval History: She denies any vaginal bleeding, abdominal/pelvic pain, cough, lethargy or abdominal distention. A CTAP in 7/23 was negative for metatstatic diease w/a persistent large parastomal hernia.  CA 125 levels remain normal.  Recently seen in consultation by Hematlogy/Oncology and was felt to be doing well.  She presented in 10/23 w/C/P.  A CTA was negative for a PE.    Review of Systems  Review of Systems  Constitutional:  Negative for malaise/fatigue and weight loss.  Respiratory:  Negative for shortness of breath and wheezing.   Cardiovascular:  Negative for chest pain and leg swelling.  Gastrointestinal:  Negative for abdominal pain, blood in stool, constipation, nausea and vomiting.  Genitourinary:  Negative for dysuria, frequency, hematuria and urgency.  Musculoskeletal:  Negative for joint pain and myalgias.  Neurological:  Negative for weakness.  Psychiatric/Behavioral:  Negative for depression. The patient does not have insomnia.    Current medications, allergy, social history, past surgical history, past medical history, family history were all reviewed.    Vitals:  BP (!) 178/64   Pulse 76   Temp 98.7 F (37.1 C)   Resp 18   Wt 147 lb 3.2 oz (66.8 kg)   SpO2 99%   BMI 27.81 kg/m   Physical Exam:  Physical Exam Exam conducted with a chaperone present.  Constitutional:      General: She is not in acute distress. Cardiovascular:     Rate and Rhythm: Normal rate and regular rhythm.  Pulmonary:     Effort: Pulmonary effort is normal.     Breath sounds: Normal breath sounds. No wheezing or rhonchi.  Abdominal:     Palpations: Abdomen is soft.     Tenderness: There is no abdominal tenderness. There is no right CVA tenderness or left CVA tenderness.     Hernia: Large hernia is present.  Genitourinary:    General:  Normal vulva.     Urethra: No urethral lesion.     Vagina: No lesions. No bleeding Musculoskeletal:     Cervical back: Neck supple.     Right lower leg: No edema.     Left lower leg: Marked edema.  Lymphadenopathy:     Upper Body:     Right upper body: No supraclavicular adenopathy.     Left upper body: No supraclavicular adenopathy.     Lower Body: No right inguinal adenopathy. No left inguinal adenopathy.  Skin:    Findings: No rash.  Neurological:     Mental Status: She is oriented to person, place, and time.   Assessment/Plan:  Endometrial cancer [facility]) Shannon Scott is a 82 y.o. with recurrent endometrial cancer, Imaging continues to be negative for any evidence of disease.  Previously on pembrolizumab for maintenance after recurrence.  Patient is overall doing well from a cancer standpoint and is NED.  Marland Kitchen   Continue q 6 month follow-up; return to this clinic in 8/24.    Lahoma Crocker, MD

## 2022-06-30 DIAGNOSIS — K56609 Unspecified intestinal obstruction, unspecified as to partial versus complete obstruction: Secondary | ICD-10-CM | POA: Diagnosis not present

## 2022-06-30 DIAGNOSIS — Z933 Colostomy status: Secondary | ICD-10-CM | POA: Diagnosis not present

## 2022-07-02 ENCOUNTER — Other Ambulatory Visit: Payer: Self-pay | Admitting: Hematology & Oncology

## 2022-07-02 DIAGNOSIS — M792 Neuralgia and neuritis, unspecified: Secondary | ICD-10-CM

## 2022-07-02 DIAGNOSIS — G8929 Other chronic pain: Secondary | ICD-10-CM

## 2022-07-02 DIAGNOSIS — C541 Malignant neoplasm of endometrium: Secondary | ICD-10-CM

## 2022-07-02 DIAGNOSIS — M545 Low back pain, unspecified: Secondary | ICD-10-CM

## 2022-07-09 DIAGNOSIS — R300 Dysuria: Secondary | ICD-10-CM | POA: Diagnosis not present

## 2022-07-09 DIAGNOSIS — I25119 Atherosclerotic heart disease of native coronary artery with unspecified angina pectoris: Secondary | ICD-10-CM | POA: Diagnosis not present

## 2022-07-09 DIAGNOSIS — I700 Atherosclerosis of aorta: Secondary | ICD-10-CM | POA: Diagnosis not present

## 2022-07-09 DIAGNOSIS — E785 Hyperlipidemia, unspecified: Secondary | ICD-10-CM | POA: Diagnosis not present

## 2022-07-09 DIAGNOSIS — E039 Hypothyroidism, unspecified: Secondary | ICD-10-CM | POA: Diagnosis not present

## 2022-07-15 DIAGNOSIS — E785 Hyperlipidemia, unspecified: Secondary | ICD-10-CM | POA: Diagnosis not present

## 2022-07-15 DIAGNOSIS — I25119 Atherosclerotic heart disease of native coronary artery with unspecified angina pectoris: Secondary | ICD-10-CM | POA: Diagnosis not present

## 2022-07-15 DIAGNOSIS — E039 Hypothyroidism, unspecified: Secondary | ICD-10-CM | POA: Diagnosis not present

## 2022-07-15 DIAGNOSIS — I10 Essential (primary) hypertension: Secondary | ICD-10-CM | POA: Diagnosis not present

## 2022-07-15 DIAGNOSIS — I2510 Atherosclerotic heart disease of native coronary artery without angina pectoris: Secondary | ICD-10-CM | POA: Diagnosis not present

## 2022-07-15 DIAGNOSIS — I4891 Unspecified atrial fibrillation: Secondary | ICD-10-CM | POA: Diagnosis not present

## 2022-07-27 ENCOUNTER — Encounter: Payer: Self-pay | Admitting: Hematology & Oncology

## 2022-07-28 DIAGNOSIS — Z933 Colostomy status: Secondary | ICD-10-CM | POA: Diagnosis not present

## 2022-07-28 DIAGNOSIS — K56609 Unspecified intestinal obstruction, unspecified as to partial versus complete obstruction: Secondary | ICD-10-CM | POA: Diagnosis not present

## 2022-07-29 ENCOUNTER — Inpatient Hospital Stay: Payer: Medicare Other

## 2022-07-29 ENCOUNTER — Inpatient Hospital Stay: Payer: Medicare Other | Admitting: Medical Oncology

## 2022-07-29 ENCOUNTER — Other Ambulatory Visit: Payer: Self-pay

## 2022-07-29 ENCOUNTER — Inpatient Hospital Stay: Payer: Medicare Other | Attending: Hematology & Oncology

## 2022-07-29 ENCOUNTER — Encounter: Payer: Self-pay | Admitting: Medical Oncology

## 2022-07-29 VITALS — BP 158/45 | HR 64 | Temp 98.9°F | Resp 18 | Ht 61.0 in | Wt 149.0 lb

## 2022-07-29 VITALS — BP 139/48 | HR 63 | Temp 98.2°F | Resp 18

## 2022-07-29 DIAGNOSIS — D509 Iron deficiency anemia, unspecified: Secondary | ICD-10-CM | POA: Diagnosis not present

## 2022-07-29 DIAGNOSIS — D508 Other iron deficiency anemias: Secondary | ICD-10-CM | POA: Diagnosis not present

## 2022-07-29 DIAGNOSIS — M8448XS Pathological fracture, other site, sequela: Secondary | ICD-10-CM | POA: Insufficient documentation

## 2022-07-29 DIAGNOSIS — Z90710 Acquired absence of both cervix and uterus: Secondary | ICD-10-CM | POA: Insufficient documentation

## 2022-07-29 DIAGNOSIS — I890 Lymphedema, not elsewhere classified: Secondary | ICD-10-CM | POA: Insufficient documentation

## 2022-07-29 DIAGNOSIS — Z923 Personal history of irradiation: Secondary | ICD-10-CM | POA: Diagnosis not present

## 2022-07-29 DIAGNOSIS — Z9221 Personal history of antineoplastic chemotherapy: Secondary | ICD-10-CM | POA: Diagnosis not present

## 2022-07-29 DIAGNOSIS — Z8542 Personal history of malignant neoplasm of other parts of uterus: Secondary | ICD-10-CM | POA: Diagnosis present

## 2022-07-29 DIAGNOSIS — Z9079 Acquired absence of other genital organ(s): Secondary | ICD-10-CM | POA: Insufficient documentation

## 2022-07-29 DIAGNOSIS — C541 Malignant neoplasm of endometrium: Secondary | ICD-10-CM

## 2022-07-29 DIAGNOSIS — Z90722 Acquired absence of ovaries, bilateral: Secondary | ICD-10-CM | POA: Insufficient documentation

## 2022-07-29 DIAGNOSIS — C539 Malignant neoplasm of cervix uteri, unspecified: Secondary | ICD-10-CM

## 2022-07-29 DIAGNOSIS — C799 Secondary malignant neoplasm of unspecified site: Secondary | ICD-10-CM | POA: Diagnosis not present

## 2022-07-29 DIAGNOSIS — Z95828 Presence of other vascular implants and grafts: Secondary | ICD-10-CM | POA: Diagnosis not present

## 2022-07-29 LAB — CBC WITH DIFFERENTIAL (CANCER CENTER ONLY)
Abs Immature Granulocytes: 0.01 10*3/uL (ref 0.00–0.07)
Basophils Absolute: 0 10*3/uL (ref 0.0–0.1)
Basophils Relative: 1 %
Eosinophils Absolute: 0.1 10*3/uL (ref 0.0–0.5)
Eosinophils Relative: 1 %
HCT: 34.2 % — ABNORMAL LOW (ref 36.0–46.0)
Hemoglobin: 10.9 g/dL — ABNORMAL LOW (ref 12.0–15.0)
Immature Granulocytes: 0 %
Lymphocytes Relative: 15 %
Lymphs Abs: 0.7 10*3/uL (ref 0.7–4.0)
MCH: 28.5 pg (ref 26.0–34.0)
MCHC: 31.9 g/dL (ref 30.0–36.0)
MCV: 89.3 fL (ref 80.0–100.0)
Monocytes Absolute: 0.4 10*3/uL (ref 0.1–1.0)
Monocytes Relative: 7 %
Neutro Abs: 3.6 10*3/uL (ref 1.7–7.7)
Neutrophils Relative %: 76 %
Platelet Count: 264 10*3/uL (ref 150–400)
RBC: 3.83 MIL/uL — ABNORMAL LOW (ref 3.87–5.11)
RDW: 14.2 % (ref 11.5–15.5)
WBC Count: 4.7 10*3/uL (ref 4.0–10.5)
nRBC: 0 % (ref 0.0–0.2)

## 2022-07-29 LAB — IRON AND IRON BINDING CAPACITY (CC-WL,HP ONLY)
Iron: 36 ug/dL (ref 28–170)
Saturation Ratios: 16 % (ref 10.4–31.8)
TIBC: 231 ug/dL — ABNORMAL LOW (ref 250–450)
UIBC: 195 ug/dL (ref 148–442)

## 2022-07-29 LAB — RETICULOCYTES
Immature Retic Fract: 7.7 % (ref 2.3–15.9)
RBC.: 3.78 MIL/uL — ABNORMAL LOW (ref 3.87–5.11)
Retic Count, Absolute: 35.2 10*3/uL (ref 19.0–186.0)
Retic Ct Pct: 0.9 % (ref 0.4–3.1)

## 2022-07-29 LAB — CMP (CANCER CENTER ONLY)
ALT: 7 U/L (ref 0–44)
AST: 13 U/L — ABNORMAL LOW (ref 15–41)
Albumin: 4.1 g/dL (ref 3.5–5.0)
Alkaline Phosphatase: 75 U/L (ref 38–126)
Anion gap: 8 (ref 5–15)
BUN: 13 mg/dL (ref 8–23)
CO2: 28 mmol/L (ref 22–32)
Calcium: 9.5 mg/dL (ref 8.9–10.3)
Chloride: 104 mmol/L (ref 98–111)
Creatinine: 0.8 mg/dL (ref 0.44–1.00)
GFR, Estimated: >60 mL/min (ref >60)
Glucose, Bld: 94 mg/dL (ref 70–99)
Potassium: 3.9 mmol/L (ref 3.5–5.1)
Sodium: 140 mmol/L (ref 135–145)
Total Bilirubin: 0.3 mg/dL (ref 0.3–1.2)
Total Protein: 6.7 g/dL (ref 6.5–8.1)

## 2022-07-29 LAB — FERRITIN: Ferritin: 188 ng/mL (ref 11–307)

## 2022-07-29 MED ORDER — HEPARIN SOD (PORK) LOCK FLUSH 100 UNIT/ML IV SOLN
500.0000 [IU] | Freq: Once | INTRAVENOUS | Status: AC | PRN
  Administered 2022-07-29: 500 [IU]

## 2022-07-29 MED ORDER — SODIUM CHLORIDE 0.9% FLUSH
10.0000 mL | Freq: Once | INTRAVENOUS | Status: AC | PRN
  Administered 2022-07-29: 10 mL

## 2022-07-29 MED ORDER — SODIUM CHLORIDE 0.9% FLUSH
10.0000 mL | Freq: Once | INTRAVENOUS | Status: AC
  Administered 2022-07-29: 10 mL

## 2022-07-29 MED ORDER — SODIUM CHLORIDE 0.9 % IV SOLN: INTRAVENOUS | Status: DC

## 2022-07-29 MED ORDER — ZOLEDRONIC ACID 4 MG/100ML IV SOLN
4.0000 mg | Freq: Once | INTRAVENOUS | Status: AC
  Administered 2022-07-29: 4 mg via INTRAVENOUS
  Filled 2022-07-29: qty 100

## 2022-07-29 NOTE — Patient Instructions (Signed)

## 2022-07-29 NOTE — Progress Notes (Signed)
Hematology and Oncology Follow Up Visit  Shannon Scott [id number] [birth year] 82 y.o. 07/29/2022   Principle Diagnosis:  Locally advanced/recurrent endometrial carcinoma undifferentiated --  TMB (HIGH) / MSI HIGH  Intermittent iron deficiency anemia   Past Therapy: Radiation therapy for 5 -5 1/2 weeks - s/p 20 fractions Taxol/carboplatinum q 7 days -- s/p cycle 6    Current Therapy:        Pembrolizumab 400 mg q 12 weeks, s/p cycle 20 (originally started on 01/23/2019 at 400 mg IV q 6 wk)  --on hold for patient request since April 2022 Zometa 4 mg IV q 3 months -- next dose due IV iron as indicated --Venofer given 12/02/2021     Interim History:  Shannon Scott is here today for follow-up.    She reports that she has been doing well overall. She has no concerns today.   She is due (overdue) for her zometa and CT scan- pt elected deferment until now.   No fevers, bleeding episodes. She has had no cough or shortness of breath.  She has had no rashes.  Stable bilateral lymphedema -worse in the left leg  There is no fever.  She has had no nausea or vomiting.  Overall, I would say her performance status is ECOG 1.    Wt Readings from Last 3 Encounters:  07/29/22 149 lb (67.6 kg)  06/23/22 147 lb 3.2 oz (66.8 kg)  05/03/22 149 lb 6.4 oz (67.8 kg)   Medications:  Allergies as of 07/29/2022       Reactions   Statins Other (See Comments)   Myalgias and memory problems   Ciprofloxacin Itching, Other (See Comments)   Splotchy redness with itching during IV infusion localized to arm. Other reaction(s): itching/swelling/redness at injection site        Medication List        Accurate as of July 29, 2022 12:57 PM. If you have any questions, ask your nurse or doctor.          amoxicillin 500 MG capsule Commonly known as: AMOXIL Take 500 mg by mouth as needed (for dentist appointments).   apixaban 5 MG Tabs tablet Commonly known as: ELIQUIS Take 1 tablet (5 mg  total) by mouth 2 (two) times daily.   ciclopirox 8 % solution Commonly known as: Penlac Apply topically at bedtime. Apply over nail and surrounding skin. Apply daily over previous coat. After seven (7) days, may remove with alcohol and continue cycle. What changed:  how much to take when to take this additional instructions   clobetasol ointment 0.05 % Commonly known as: TEMOVATE Apply 1 Application topically 2 (two) times a week.   diltiazem 300 MG 24 hr capsule Commonly known as: TIAZAC TAKE 1 CAPSULE(300 MG) BY MOUTH DAILY What changed: See the new instructions.   ezetimibe 10 MG tablet Commonly known as: ZETIA Take 10 mg by mouth daily.   furosemide 20 MG tablet Commonly known as: LASIX Take 1 tablet (20 mg total) by mouth daily.   levothyroxine 50 MCG tablet Commonly known as: SYNTHROID Take 1 tablet (50 mcg total) by mouth daily before breakfast.   mometasone 0.1 % cream Commonly known as: ELOCON Apply 1 Application topically daily as needed (to the vulva for irritation and itching).   polyethylene glycol 17 g packet Commonly known as: MIRALAX / GLYCOLAX Take 17 g by mouth daily as needed for severe constipation. What changed: when to take this   Repatha SureClick [id number] MG/ML Soaj Generic  drug: Evolocumab INJECT 1 DOSE UNDER THE SKIN EVERY 14 DAYS   traMADol 50 MG tablet Commonly known as: ULTRAM TAKE 2 TABLETS(100 MG) BY MOUTH TWICE DAILY        Allergies:  Allergies  Allergen Reactions   Statins Other (See Comments)    Myalgias and memory problems   Ciprofloxacin Itching and Other (See Comments)    Splotchy redness with itching during IV infusion localized to arm. Other reaction(s): itching/swelling/redness at injection site    Past Medical History, Surgical history, Social history, and Family History were reviewed and updated.  Review of Systems: Review of Systems  Constitutional: Negative.   HENT: Negative.    Eyes: Negative.    Respiratory: Negative.    Cardiovascular:  Positive for leg swelling.  Gastrointestinal: Negative.   Genitourinary: Negative.   Musculoskeletal:  Positive for back pain.  Skin: Negative.   Neurological: Negative.   Endo/Heme/Allergies: Negative.   Psychiatric/Behavioral: Negative.       Physical Exam:  height is '5\' 1"'$  (1.549 m) and weight is 149 lb (67.6 kg). Her oral temperature is 98.9 F (37.2 C). Her blood pressure is 158/45 (abnormal) and her pulse is 64. Her respiration is 18 and oxygen saturation is 99%.   Wt Readings from Last 3 Encounters:  07/29/22 149 lb (67.6 kg)  06/23/22 147 lb 3.2 oz (66.8 kg)  05/03/22 149 lb 6.4 oz (67.8 kg)    Physical Exam Vitals reviewed.  HENT:     Head: Normocephalic and atraumatic.  Eyes:     Pupils: Pupils are equal, round, and reactive to light.  Cardiovascular:     Rate and Rhythm: Normal rate and regular rhythm.     Heart sounds: Normal heart sounds.     Comments: Cardiac exam shows a regular rate and rhythm.  She has a 2/6 systolic ejection murmur.   Pulmonary:     Effort: Pulmonary effort is normal.     Breath sounds: Normal breath sounds.  Abdominal:     General: Bowel sounds are normal.     Palpations: Abdomen is soft.  Musculoskeletal:        General: No tenderness or deformity. Normal range of motion.     Cervical back: Normal range of motion.     Comments: She has significant lymphedema in her legs.  She has probably 3+ in the left leg and 1+ in the right leg. She has pitting edema.  She has decent range of motion of her joints.  Lymphadenopathy:     Cervical: No cervical adenopathy.  Skin:    General: Skin is warm and dry.     Findings: No erythema or rash.  Neurological:     Mental Status: She is alert and oriented to person, place, and time.  Psychiatric:        Behavior: Behavior normal.        Thought Content: Thought content normal.        Judgment: Judgment normal.      Lab Results  Component Value  Date   WBC 4.7 07/29/2022   HGB 10.9 (L) 07/29/2022   HCT 34.2 (L) 07/29/2022   MCV 89.3 07/29/2022   PLT 264 07/29/2022   Lab Results  Component Value Date   FERRITIN 206 04/29/2022   IRON 18 (L) 04/29/2022   TIBC 221 (L) 04/29/2022   UIBC 203 04/29/2022   IRONPCTSAT 8 (L) 04/29/2022   Lab Results  Component Value Date   RETICCTPCT 0.9 07/29/2022   RBC  3.78 (L) 07/29/2022   No results found for: "KPAFRELGTCHN", "LAMBDASER", "KAPLAMBRATIO" No results found for: "IGGSERUM", "IGA", "IGMSERUM" No results found for: "TOTALPROTELP", "ALBUMINELP", "A1GS", "A2GS", "BETS", "BETA2SER", "GAMS", "MSPIKE", "SPEI"   Chemistry      Component Value Date/Time   NA 140 07/29/2022 1000   NA 136 (A) 09/08/2017 0000   K 3.9 07/29/2022 1000   CL 104 07/29/2022 1000   CO2 28 07/29/2022 1000   BUN 13 07/29/2022 1000   BUN 8 09/08/2017 0000   CREATININE 0.80 07/29/2022 1000   CREATININE 0.69 08/02/2016 1519   GLU 111 09/08/2017 0000      Component Value Date/Time   CALCIUM 9.5 07/29/2022 1000   ALKPHOS 75 07/29/2022 1000   AST 13 (L) 07/29/2022 1000   ALT 7 07/29/2022 1000   BILITOT 0.3 07/29/2022 1000      Encounter Diagnoses  Name Primary?   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 Chidester is a very pleasant 82 yo caucasian female with recurrent undifferentiated endometrial carcinoma.   She was on immunotherapy.  She has a high MSI.  She really has done well with immunotherapy.    She is overdue for her Zometa and for her CT scan- Zometa today. CT order placed and scheduling has been updated.   Reviewed lab work from today which is stable.   Disposition: Zometa today Continue port flushes every 6 weeks CT scan to be scheduled  RTC 3 months MD, labs [place], [place] 2/29/202412:57 PM

## 2022-08-19 ENCOUNTER — Other Ambulatory Visit: Payer: Self-pay | Admitting: Hematology & Oncology

## 2022-08-19 DIAGNOSIS — M545 Low back pain, unspecified: Secondary | ICD-10-CM

## 2022-08-19 DIAGNOSIS — C541 Malignant neoplasm of endometrium: Secondary | ICD-10-CM

## 2022-08-19 DIAGNOSIS — M792 Neuralgia and neuritis, unspecified: Secondary | ICD-10-CM

## 2022-08-19 DIAGNOSIS — G8929 Other chronic pain: Secondary | ICD-10-CM

## 2022-09-02 ENCOUNTER — Encounter: Payer: Self-pay | Admitting: Gynecologic Oncology

## 2022-09-03 ENCOUNTER — Encounter: Payer: Self-pay | Admitting: Gynecologic Oncology

## 2022-09-03 ENCOUNTER — Inpatient Hospital Stay: Payer: Medicare Other | Attending: Hematology & Oncology | Admitting: Gynecologic Oncology

## 2022-09-03 ENCOUNTER — Other Ambulatory Visit: Payer: Self-pay

## 2022-09-03 VITALS — BP 158/56 | HR 77 | Temp 99.0°F | Wt 149.4 lb

## 2022-09-03 DIAGNOSIS — Z90710 Acquired absence of both cervix and uterus: Secondary | ICD-10-CM | POA: Diagnosis not present

## 2022-09-03 DIAGNOSIS — Z923 Personal history of irradiation: Secondary | ICD-10-CM | POA: Diagnosis not present

## 2022-09-03 DIAGNOSIS — Z8542 Personal history of malignant neoplasm of other parts of uterus: Secondary | ICD-10-CM | POA: Diagnosis not present

## 2022-09-03 DIAGNOSIS — B379 Candidiasis, unspecified: Secondary | ICD-10-CM

## 2022-09-03 DIAGNOSIS — Z9221 Personal history of antineoplastic chemotherapy: Secondary | ICD-10-CM | POA: Diagnosis not present

## 2022-09-03 DIAGNOSIS — Z90722 Acquired absence of ovaries, bilateral: Secondary | ICD-10-CM | POA: Diagnosis not present

## 2022-09-03 DIAGNOSIS — I890 Lymphedema, not elsewhere classified: Secondary | ICD-10-CM | POA: Diagnosis not present

## 2022-09-03 DIAGNOSIS — B372 Candidiasis of skin and nail: Secondary | ICD-10-CM | POA: Diagnosis not present

## 2022-09-03 DIAGNOSIS — C541 Malignant neoplasm of endometrium: Secondary | ICD-10-CM

## 2022-09-03 MED ORDER — NYSTATIN 100000 UNIT/GM EX POWD: 1.0000 | Freq: Three times a day (TID) | CUTANEOUS | 1 refills | Status: DC

## 2022-09-03 NOTE — Progress Notes (Signed)
Gynecologic Oncology Return Clinic Visit  09/03/22  Reason for Visit:  surveillance visit in the setting of endometrial cancer   Treatment History: Oncology History Overview Note  The patient began noticing postmenopausal bleeding in June, 2017. She saw her primary care physician who determined there was no blood in the urine, and she was then referred to Dr Juliene Pina who, on 11/21/15 performed a TVUS which showed a uterus measuring 6x2.7x3.3cm with normal ovaries. There was a thickened endometrium of 6.26mm.    Endometrial cancer  11/21/2015 Initial Diagnosis   Endometrial cancer (HCC) A pipelle endometrial biopsy was performed on 11/21/15 which showed FIGO grade 1-2 endometrial cancer.  The pap from the same date was normal.   12/30/2015 Surgery   Robotic assisted total hysterectomy, BSO, pelvic and PA SLN biopsy. 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  Postoperative Diagnosis: same.  Surgery: Total robotic hysterectomy bilateral salpingo-oophorectomy, left pelvic, right para-aortic and bifurcation SLN removal  Operative findings:  1) Colon adherent to posterior uterus, left sidewall and appendix 2) 2 right para-aortic nodes for SLN (high and PA node) 3) Aortic bifurcation nodes 4) Left obturator node  Diagnosis 1. Lymph node, sentinel, biopsy, right obturator - ONE BENIGN LYMPH NODE (0/1). 2. Lymph node, sentinel, biopsy, right peri-aortic - ONE BENIGN LYMPH NODE (0/1). 3. Lymph node, sentinel, biopsy, right lower peri-aortic - ONE BENIGN LYMPH NODE (0/1). 4. Lymph node, sentinel, biopsy, aortic bifurcation - ONE BENIGN LYMPH NODE (0/1). 5. Lymph node, sentinel, biopsy, left obturator - ONE BENIGN LYMPH NODE (0/1). 6. Uterus +/- tubes/ovaries, neoplastic - ENDOMETRIAL ADENOCARCINOMA, 1.7 CM WITH SUPERFICIAL MYOMETRIAL INVASION. - MARGINS NOT INVOLVED. - CERVIX, BILATERAL OVARIES AND BILATERAL FALLOPIAN TUBES FREE OF TUMOR. Microscopic  Comment 6. ONCOLOGY TABLE-UTERUS, CARCINOMA OR CARCINOSARCOMA Specimen: Uterus with bilateral fallopian tubes and ovaries and sentinel lymph node biopsies. Procedure: Hysterectomy with sentinel lymph nodes. Lymph node sampling performed: Yes. Specimen integrity: Intact. Maximum tumor size: 1.7 cm Histologic type: Endometrioid with squamous differentiation. Grade: 2 Myometrial invasion: 0.3 cm where myometrium is 1 cm in thickness Cervical stromal involvement: No. Extent of involvement of other organs: None, identified. Lymph - vascular invasion: Not identified.    05/13/2016 Imaging   05/03/16 CT guided drainage: IMPRESSION: Successful CT guided aspiration of approximately 15 cc of serous, slightly cloudy / milky fluid from the residual collection within the left hemipelvis. All aspirated samples were sent to the laboratory for cytologic and gram stain analysis.  Cytology was negative   07/2017 Progression   Shannon Scott in February 2019 presented to her primary physician with complaints of rectal bleeding.  She was scheduled for a colonoscopy and had taken the bowel prep when she presented with obstruction of her distal sigmoid colon.   08/18/2017 Imaging   maging 08/18/2017 The left ureter is dilated to the left hemipelvis. It becomes narrowed in the inflammatory pelvic mass. This is described below. Bladder is within normal limits.   Stomach/Bowel: Large hiatal hernia is unchanged. Stable prominent gastric folds within the hiatal hernia.   The colon is diffusely distended. Distal small bowel is distended. A transition point between dilated large bowel and decompressed large bowel occurs in the sigmoid colon. There is a heterogeneous ill-defined mass involving the sigmoid colon and left side of the pelvis which includes the colon wall, adjacent fat, and both fluid and gas elements. Findings are suspicious for a focal perforation with abscess formation. Underlying malignancy is a  strong consideration given the appearance and colon  obstruction. The mass also involves the left ureter causing an element of left ureteral dilatation worrisome for an element of left ureteral obstruction. The gas and fluid collection is very small measuring 1.8 x 1.2 cm on  image 109 of series 4. Overall mass size including the involved colon is 5.7 x 5.3 cm.    08/21/2017 Surgery   Shannon Scott underwent a diverting laparoscopic transverse loop colostomy and subsequent placement of a left ureteral stent.   08/2017 -  Radiation Therapy   She then received pelvic external beam radiation therapy    10/13/2017 - 11/17/2017 Chemotherapy   The patient had dexamethasone (DECADRON) 4 MG tablet, 8 mg, Oral, Daily, 1 of 1 cycle, Start date: --, End date: -- palonosetron (ALOXI) injection 0.25 mg, 0.25 mg, Intravenous,  Once, 6 of 6 cycles Administration: 0.25 mg (10/13/2017), 0.25 mg (10/20/2017), 0.25 mg (11/03/2017), 0.25 mg (11/10/2017), 0.25 mg (11/17/2017) CARBOplatin (PARAPLATIN) 150 mg in sodium chloride 0.9 % 100 mL chemo infusion, 150 mg (100 % of original dose 148 mg), Intravenous,  Once, 6 of 6 cycles Dose modification:   (original dose 148 mg, Cycle 1) Administration: 150 mg (10/13/2017), 150 mg (10/20/2017), 150 mg (11/03/2017), 150 mg (11/10/2017), 150 mg (11/17/2017) PACLitaxel (TAXOL) 132 mg in sodium chloride 0.9 % 250 mL chemo infusion (</= 80mg /m2), 80 mg/m2 = 132 mg, Intravenous,  Once, 6 of 6 cycles Dose modification: 45 mg/m2 (original dose 80 mg/m2, Cycle 2, Reason: Other (see comments), Comment: Decreasing dose to 45 mg/m2 while undergoing XRT) Administration: 132 mg (10/13/2017), 72 mg (10/20/2017), 72 mg (11/03/2017), 72 mg (11/10/2017), 72 mg (11/17/2017)  for chemotherapy treatment.    01/18/2018 Imaging   notable for the presence of an IVC filter and 11 mm left internal iliac node that was previously 1.6 cm and a soft tissue mass in the left pelvic sidewall and now measuring 3.9 x 2.5  cm.   03/03/2018 Imaging   PET  demonstrates a hypermetabolic left pelvic sidewall mass, greater than 3 weeks after completion of radiotherapy.   03/16/2018 - 09/08/2020 Chemotherapy   Patient is on Treatment Plan : COLORECTAL Pembrolizumab q21d     04/2018 Miscellaneous    CA 125  Ref. Range 05/04/2018 12:08 05/25/2018 11:21 06/15/2018 13:30 10/10/2018 12:20 01/23/2019 11:35  Cancer Antigen (CA) 125 Latest Ref Range: 0.0 - 38.1 U/mL 11.2 10.9 10.2 10.9 9.2    10/05/2018 Imaging   PET  3. Continued positive response to therapy within the left pelvic sidewall mass, which demonstrates residual low level hypermetabolism that has mildly decreased. 4. Resolved mediastinal nodal hypermetabolism. Nearly resolved bilateral hilar nodal hypermetabolism. These findings could represent resolving reactive nodes versus response to therapy within metastatic nodes. 5. No metabolic evidence of new sites of metastatic disease.   09/27/2019 Imaging   CT C/A/P: IMPRESSION: 1. Since the PET of 10/05/2018, similar to slight decrease in left pelvic sidewall soft tissue fullness, without well-defined mass. 2. No findings of  new or progressive disease. 3. Chronic or recurrent left renal pelvic and proximal ureteric wall thickening/mucosal hyperenhancement. Nonspecific, especially in the setting of a prior stent. Cannot exclude ascending infection. Similarly, improved bladder wall and surrounding edema which could simply be treatment related. Correlate with urinalysis to exclude cystitis. 4. Small hiatal hernia. 5. Transverse colostomy with similar small bowel containing parastomal hernia. 6.  Aortic Atherosclerosis (ICD10-I70.0).   03/17/2020 Imaging   CT C/A/P: 1. No evidence of cervical cancer local recurrence or metastatic disease. 2. LEFT midline colostomy  with peristomal hernia. Long segment small bowel enters the hernia sac without obstruction. 3. Chronic fractures sacral insufficiency  fractures   06/03/2020 Imaging   CT C/A/P: 1. Status post hysterectomy and bilateral oophorectomy. No findings for recurrent tumor, regional lymphadenopathy or metastatic disease. 2. Stable loop colostomy with sizable parastomal hernia containing small bowel and colon. No obstructive findings. 3. Stable healed sacral insufficiency fractures and remote partially united fracture involving the right pubic symphysis. 4. Advanced atherosclerotic calcifications involving the thoracic and abdominal aorta and branch vessels including the coronary arteries. 5. Aortic atherosclerosis.   10/01/2020 Imaging   CT C/A/P: IMPRESSION: 1. Status post hysterectomy. No evidence of recurrent or metastatic disease in the chest, abdomen, or pelvis. 2. Status post diverting transverse loop colostomy with a large midline parastomal hernia containing multiple nonobstructed loops of transverse colon and small bowel. 3. Unchanged thickening of the urinary bladder wall, likely due to pelvic radiation. 4. Unchanged sclerotic insufficiency fractures of the bilateral sacral ala. Unchanged chronic fracture deformity of the right pubic symphysis. 5. Cholelithiasis. 6. Coronary artery disease.     Interval History: Last med onc visit was at the end of February.   Patient reports overall doing well.  Has recently developed some itching and redness under her breasts and in her groins.  Notes continued urinary frequency.  Has intermittent vulvar symptoms including pruritus, clobetasol helps although sometimes she has some irritation when she uses this.  She denies any vaginal bleeding or discharge.  Reports good bowel function through her ostomy if she remembers to take her MiraLAX.  She denies any pelvic pain.  Notes that appetite has improved.  She continues to have persistent left lower extremity swelling, uses compression socks.  Has noticed some intermittent mild right foot swelling.  Past Medical/Surgical  History: Past Medical History:  Diagnosis Date   A-fib 03/26/2020   Acute CVA (cerebrovascular accident) 03/26/2020   Arthritis    Asthma    allergy induced   Bilateral carotid artery disease    L carotid bruit   Bursitis    left hip   CAD (coronary artery disease)    Cancer    Dyslipidemia    intolerant to statins, welchol, niacin, zetia   Goals of care, counseling/discussion 10/11/2017   History of blood transfusion    History of nuclear stress test 04/24/2012   lexiscan; normal study   Hypertension    Hypothyroidism    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 10/11/2017   Postoperative nausea and vomiting 01/02/2016    Past Surgical History:  Procedure Laterality Date   ABDOMINAL HYSTERECTOMY     Carotid Doppler  02/2012   40-59% right int carotid artery stenosis; 60-79% L int carotid stenosis; L carotid bruit   CORONARY ARTERY BYPASS GRAFT  03/12/2004   LIMA to LAD, SVG to circumflex, SVG to PDA   CYSTOSCOPY W/ URETERAL STENT PLACEMENT Left 12/06/2017   Procedure: CYSTOSCOPY WITH LEFT URETERAL STENT EXCHANGE;  Surgeon: Bjorn Pippin, MD;  Location: [facility];  Service: Urology;  Laterality: Left;   CYSTOSCOPY WITH STENT PLACEMENT Bilateral 08/21/2017   Procedure: CYSTOSCOPY WITH STENT PLACEMENT;  Surgeon: Rene Paci, MD;  Location: [facility];  Service: Urology;  Laterality: Bilateral;   IR FLUORO GUIDE PORT INSERTION RIGHT  10/11/2017   IR GENERIC HISTORICAL  04/29/2016   IR RADIOLOGIST EVAL & MGMT 04/29/2016 Simonne Come, MD [facility]   IR GENERIC HISTORICAL  05/12/2016   IR RADIOLOGIST EVAL &  MGMT 05/12/2016 Simonne Come, MD [facility]   IR IVC FILTER PLMT / S&I Lenise Arena GUID/MOD SED  10/11/2017   IR US GUIDE VASC ACCESS RIGHT  10/11/2017   ROBOTIC ASSISTED TOTAL HYSTERECTOMY WITH BILATERAL SALPINGO OOPHERECTOMY Bilateral 12/30/2015   Procedure: XI ROBOTIC ASSISTED TOTAL HYSTERECTOMY WITH BILATERAL SALPINGO OOPHORECTOMY WITH  SENTAL LYMPH NODE BIOPSY;  Surgeon: Cleda Mccreedy, MD;  Location: [facility];  Service: Gynecology;  Laterality: Bilateral;   TONSILLECTOMY     TRANSTHORACIC ECHOCARDIOGRAM  04/2007   EF>55%; mild MR; mild-mod TR; mild pulm HTN; mild calcification of aortiv valve leaflets with mild valvular aortic stenosis   TUBAL LIGATION      Family History  Problem Relation Age of Onset   Hypertension Mother    Heart disease Mother        Died in her 86s   Stroke Father    Kidney disease Brother    Heart disease Brother        also HTN, hyperlipidemia   Heart attack Brother    Stroke Sister        x2   Hypertension Sister     Social History   Socioeconomic History   Marital status: Widowed    Spouse name: Not on file   Number of children: 2   Years of education: Not on file   Highest education level: Not on file  Occupational History    Employer: RETIRED  Tobacco Use   Smoking status: Never   Smokeless tobacco: Never  Vaping Use   Vaping Use: Never used  Substance and Sexual Activity   Alcohol use: No   Drug use: No   Sexual activity: Not Currently  Other Topics Concern   Not on file  Social History Narrative   Not on file   Social Determinants of Health   Financial Resource Strain: Not on file  Food Insecurity: No Food Insecurity (02/18/2022)   Hunger Vital Sign    Worried About Running Out of Food in the Last Year: Never true    Ran Out of Food in the Last Year: Never true  Transportation Needs: No Transportation Needs (02/18/2022)   PRAPARE - Administrator, Civil Service (Medical): No    Lack of Transportation (Non-Medical): No  Physical Activity: Not on file  Stress: Not on file  Social Connections: Not on file    Current Medications:  Current Outpatient Medications:    apixaban (ELIQUIS) 5 MG TABS tablet, Take 1 tablet (5 mg total) by mouth 2 (two) times daily., Disp: 60 tablet, Rfl: 0   clobetasol ointment (TEMOVATE) 0.05 %, Apply 1 Application topically  2 (two) times a week., Disp: 30 g, Rfl: 3   diltiazem (TIAZAC) 300 MG 24 hr capsule, TAKE 1 CAPSULE(300 MG) BY MOUTH DAILY (Patient taking differently: Take 300 mg by mouth at bedtime.), Disp: 30 capsule, Rfl: 1   ezetimibe (ZETIA) 10 MG tablet, Take 10 mg by mouth daily., Disp: , Rfl:    levothyroxine (SYNTHROID, LEVOTHROID) 50 MCG tablet, Take 1 tablet (50 mcg total) by mouth daily before breakfast., Disp: 30 tablet, Rfl: 1   mometasone (ELOCON) 0.1 % cream, Apply 1 Application topically daily as needed (to the vulva for irritation and itching)., Disp: 45 g, Rfl: 0   nystatin (MYCOSTATIN/NYSTOP) powder, Apply 1 Application topically 3 (three) times daily., Disp: 15 g, Rfl: 1   polyethylene glycol (MIRALAX / GLYCOLAX) 17 g packet, Take 17 g by mouth daily as needed for severe  constipation. (Patient taking differently: Take 17 g by mouth daily.), Disp: 14 each, Rfl: 0   REPATHA SURECLICK 140 MG/ML SOAJ, INJECT 1 DOSE UNDER THE SKIN EVERY 14 DAYS, Disp: 6 mL, Rfl: 3   traMADol (ULTRAM) 50 MG tablet, TAKE 2 TABLETS(100 MG) BY MOUTH TWICE DAILY, Disp: 120 tablet, Rfl: 0  Review of Systems: + Urinary frequency, swelling of legs, rash, itch. Denies appetite changes, fevers, chills, fatigue, unexplained weight changes. Denies hearing loss, neck lumps or masses, mouth sores, ringing in ears or voice changes. Denies cough or wheezing.  Denies shortness of breath. Denies chest pain or palpitations.  Denies abdominal distention, pain, blood in stools, constipation, diarrhea, nausea, vomiting, or early satiety. Denies pain with intercourse, dysuria, hematuria or incontinence. Denies hot flashes, pelvic pain, vaginal bleeding or vaginal discharge.   Denies joint pain, back pain or muscle pain/cramps. Denies wounds. Denies dizziness, headaches, numbness or seizures. Denies swollen lymph nodes or glands, denies easy bruising or bleeding. Denies anxiety, depression, confusion, or decreased  concentration.  Physical Exam: BP (!) 158/56 (BP Location: Left Arm, Patient Position: Sitting) Comment: informed Dr Pricilla Holm  Pulse 77   Temp 99 F (37.2 C) (Oral)   Wt 149 lb 6.4 oz (67.8 kg)   SpO2 97%   BMI 28.23 kg/m  General: Alert, oriented, no acute distress. HEENT: Normocephalic, atraumatic, sclera anicteric. Chest: Unlabored breathing on room air. Abdomen: soft, nontender.  Normoactive bowel sounds.  Mid abdominal stoma with large parastomal hernia.  Well-healed scar. Extremities: Grossly normal range of motion.  Warm, well perfused.  Trace edema of the right lower extremity, 2+ edema of the left.  Compression socks in place. Skin: Mild erythema under bilateral breasts as well as pannus. Lymphatics: No cervical, supraclavicular, or inguinal adenopathy. GU: External female genitalia notable for mild erythema of bilateral vulva.  There are some areas of tissue thinning, and very mild desquamation.  Less of architecture posteriorly of the vulva.  Single digit vaginal exam, no masses or nodularity.  Rectovaginal exam confirms these findings.  Currently exam is performed with a small speculum, no lesions, masses, or atypical vascularity noted.  Laboratory & Radiologic Studies: CT C/A/P: 12/18/21 1. Status post hysterectomy. 2. No evidence of lymphadenopathy or metastatic disease in the chest, abdomen, or pelvis. 3. Unchanged large, midline parastomal hernia containing multiple loops of nonobstructed small bowel. 4. Cholelithiasis. 5. Coronary artery disease.  Assessment & Plan: DEARIA WILMOUTH is a 82 y.o. woman with recurrent endometrial cancer, imaging continues to be negative for any evidence of disease.  She had been on pembrolizumab for maintenance after recurrence although has been off treatment for over a year now.  Most recent CT shows no presence of recurrent disease.   Patient is overall doing well from a cancer standpoint and is NED.  Does not have follow-up scheduled  with her medical oncologist.  I am having Clydie Braun, our nurse navigator, reach out to her medical oncologist office to schedule follow-up.  She also was supposed to have a CT scan for imaging surveillance, which was never scheduled.  Office offered scheduled this today the patient's preference was to check with her family to make sure timing was convenient and call back.   With regard to her vulvar symptoms, she has been well controlled on intermittent low dose topical steroids when she develops symptoms.  I encouraged her to continue using the steroid cream as needed.  I have asked her to call the clinic if she develops symptoms that do  not respond to short course of steroid cream.  Given some irritation when she uses the cream, we discussed using clobetasol only 2 or 3 times a week when she has symptoms instead of daily.  Given symptoms and exam findings consistent with Candida, prescription sent for nystatin powder to her pharmacy.  Lymphedema is stable.  From an endometrial cancer standpoint, we will see the patient back for follow-up in 6 months.  24 minutes of total time was spent for this patient encounter, including preparation, face-to-face counseling with the patient and coordination of care, and documentation of the encounter.  Eugene GarnetKatherine Shir Bergman, MD  Division of Gynecologic Oncology  Department of Obstetrics and Gynecology  [facility]

## 2022-09-03 NOTE — Patient Instructions (Addendum)
It was good to see you today.  I do not see or feel any evidence of cancer recurrence on your exam.  I sent in a prescription for nystatin powder for your areas of yeast.  You can use this once or twice a day to help with the redness and itching.  When you use the clobetasol, please do this at most 2 or 3 times a week.  I will see you for follow-up in 6 months.  As always, if you develop any new and concerning symptoms before your next visit, please call to see me sooner.

## 2022-09-09 ENCOUNTER — Other Ambulatory Visit (HOSPITAL_BASED_OUTPATIENT_CLINIC_OR_DEPARTMENT_OTHER): Payer: Medicare Other

## 2022-09-28 DIAGNOSIS — Z933 Colostomy status: Secondary | ICD-10-CM | POA: Diagnosis not present

## 2022-09-28 DIAGNOSIS — K56609 Unspecified intestinal obstruction, unspecified as to partial versus complete obstruction: Secondary | ICD-10-CM | POA: Diagnosis not present

## 2022-10-05 ENCOUNTER — Encounter (HOSPITAL_BASED_OUTPATIENT_CLINIC_OR_DEPARTMENT_OTHER): Payer: Medicare Other | Attending: General Surgery | Admitting: General Surgery

## 2022-10-05 DIAGNOSIS — I350 Nonrheumatic aortic (valve) stenosis: Secondary | ICD-10-CM | POA: Diagnosis not present

## 2022-10-05 DIAGNOSIS — Z951 Presence of aortocoronary bypass graft: Secondary | ICD-10-CM | POA: Insufficient documentation

## 2022-10-05 DIAGNOSIS — I890 Lymphedema, not elsewhere classified: Secondary | ICD-10-CM | POA: Insufficient documentation

## 2022-10-05 DIAGNOSIS — Z923 Personal history of irradiation: Secondary | ICD-10-CM | POA: Insufficient documentation

## 2022-10-05 DIAGNOSIS — Z90710 Acquired absence of both cervix and uterus: Secondary | ICD-10-CM | POA: Insufficient documentation

## 2022-10-05 DIAGNOSIS — I2510 Atherosclerotic heart disease of native coronary artery without angina pectoris: Secondary | ICD-10-CM | POA: Diagnosis not present

## 2022-10-05 DIAGNOSIS — Z933 Colostomy status: Secondary | ICD-10-CM | POA: Insufficient documentation

## 2022-10-05 DIAGNOSIS — I4891 Unspecified atrial fibrillation: Secondary | ICD-10-CM | POA: Diagnosis not present

## 2022-10-05 DIAGNOSIS — Z90722 Acquired absence of ovaries, bilateral: Secondary | ICD-10-CM | POA: Diagnosis not present

## 2022-10-05 DIAGNOSIS — Z8673 Personal history of transient ischemic attack (TIA), and cerebral infarction without residual deficits: Secondary | ICD-10-CM | POA: Insufficient documentation

## 2022-10-05 DIAGNOSIS — Z9221 Personal history of antineoplastic chemotherapy: Secondary | ICD-10-CM | POA: Diagnosis not present

## 2022-10-05 DIAGNOSIS — Z8542 Personal history of malignant neoplasm of other parts of uterus: Secondary | ICD-10-CM | POA: Insufficient documentation

## 2022-10-05 NOTE — Progress Notes (Signed)
AMELIAMAE, HECKEL ([id number]) [phone].pdf Page 1 of 4 Visit Report for 10/05/2022 Abuse Risk Screen Details Patient Name: Date of Service: PA TARJI, ZWILLING. 10/05/2022 1:30 PM Medical Record Number: [id number] Patient Account Number: [id number] Date of Birth/Sex: Treating RN: [birth year] (82 y.o. [name] Primary Care Farrell Pantaleo: [name] Other Clinician: Referring Chrystian Cupples: Treating Gayland Nicol/Extender: Wallis Bamberg Weeks in Treatment: 0 Abuse Risk Screen Items Answer ABUSE RISK SCREEN: Has anyone close to you tried to hurt or harm you recentlyo No Do you feel uncomfortable with anyone in your familyo No Has anyone forced you do things that you didnt want to doo No Electronic Signature(s) Signed: 10/05/2022 3:53:37 PM By: Samuella Bruin Entered By: Samuella Bruin on 10/05/2022 13:30:09 -------------------------------------------------------------------------------- Activities of Daily Living Details Patient Name: Date of Service: PA MARGURIETTE, BREITWIESER 10/05/2022 1:30 PM Medical Record Number: [id number] Patient Account Number: [id number] Date of Birth/Sex: Treating RN: 06/11/1940 (82 y.o. [name] Primary Care Drayce Tawil: [name] Other Clinician: Referring Yazmine Sorey: Treating Markasia Carrol/Extender: Wallis Bamberg Weeks in Treatment: 0 Activities of Daily Living Items Answer Activities of Daily Living (Please select one for each item) Drive Automobile Not Able T Medications ake Completely Able Use T elephone Completely Able Care for Appearance Completely Able Use T oilet Completely Able Bath / Shower Completely Able Dress Self Completely Able Feed Self Completely Able Walk Completely Able Get In / Out Bed Completely Able Housework Completely Able Prepare Meals Completely Able Handle Money Completely Able Shop for Self Completely  Able Electronic Signature(s) Signed: 10/05/2022 3:53:37 PM By: Samuella Bruin Entered By: Samuella Bruin on 10/05/2022 13:30:29 -------------------------------------------------------------------------------- Education Screening Details Patient Name: Date of Service: PA Alene Mires R. 10/05/2022 1:30 PM Medical Record Number: [id number] Patient Account Number: [id number] Date of Birth/Sex: Treating RN: 1940-08-31 (82 y.o. [name] Primary Care Avrian Delfavero: [name] Other Clinician: Referring Sharee Sturdy: Treating Alayna Mabe/Extender: Jeanmarie Plant in Treatment: 0 Fredericks, Djeneba R ([id number]) [id number] Page 2 of 4 Primary Learner Assessed: Patient Learning Preferences/Education Level/Primary Language Learning Preference: Explanation, Demonstration, Video, Printed Material Highest Education Level: College or Above Preferred Language: Economist Language Barrier: No Translator Needed: No Memory Deficit: No Emotional Barrier: No Cultural/Religious Beliefs Affecting Medical Care: No Physical Barrier Impaired Vision: Yes Glasses Impaired Hearing: No Decreased Hand dexterity: No Knowledge/Comprehension Knowledge Level: Medium Comprehension Level: Medium Ability to understand written instructions: Medium Ability to understand verbal instructions: Medium Motivation Anxiety Level: Calm Cooperation: Cooperative Education Importance: Acknowledges Need Interest in Health Problems: Asks Questions Perception: Coherent Willingness to Engage in Self-Management Medium Activities: Readiness to Engage in Self-Management Medium Activities: Electronic Signature(s) Signed: 10/05/2022 3:53:37 PM By: Samuella Bruin Entered By: Samuella Bruin on 10/05/2022 13:30:59 -------------------------------------------------------------------------------- Fall Risk Assessment  Details Patient Name: Date of Service: PA Alene Mires R. 10/05/2022 1:30 PM Medical Record Number: [id number] Patient Account Number: [id number] Date of Birth/Sex: Treating RN: 1940-07-18 (82 y.o. [name] Primary Care Reeder Brisby: [name] Other Clinician: Referring Dairon Procter: Treating Ashea Winiarski/Extender: Wallis Bamberg Weeks in Treatment: 0 Fall Risk Assessment Items Have you had 2 or more falls in the last 12 monthso 0 No Have you had any fall that resulted in injury in the last 12 monthso 0 No FALLS RISK SCREEN History of falling - immediate or within 3 months 0 No Secondary diagnosis (Do you have 2 or more medical diagnoseso) 0 No Ambulatory aid None/bed rest/wheelchair/nurse 0 No Crutches/cane/walker 15 Yes Furniture 0 No  Intravenous therapy Access/Saline/Heparin Lock 0 No Gait/Transferring Normal/ bed rest/ wheelchair 0 Yes Weak (short steps with or without shuffle, stooped but able to lift head while walking, may seek 0 No support from furniture) Impaired (short steps with shuffle, may have difficulty arising from chair, head down, impaired 0 No balance) Mental Status Oriented to own ability 0 Yes Overestimates or forgets limitations 0 No Risk Level: Low Risk Score: 15 Dower, Carrigan R ([id number]) [id number] Page 3 of 4 Electronic Signature(s) -------------------------------------------------------------------------------- Foot Assessment Details Patient Name: Date of Service: PA ATZIN, SYLVIS 10/05/2022 1:30 PM Medical Record Number: [id number] Patient Account Number: [id number] Date of Birth/Sex: Treating RN: Oct 12, 1940 (82 y.o. [name] Primary Care Ilaisaane Marts: [name] Other Clinician: Referring Oanh Devivo: Treating Dayjah Selman/Extender: Wallis Bamberg Weeks in Treatment: 0 Foot Assessment Items Site Locations + = Sensation  present, - = Sensation absent, C = Callus, U = Ulcer R = Redness, W = Warmth, M = Maceration, PU = Pre-ulcerative lesion F = Fissure, S = Swelling, D = Dryness Assessment Right: Left: Other Deformity: No No Prior Foot Ulcer: No No Prior Amputation: No No Charcot Joint: No No Ambulatory Status: Gait: Electronic Signature(s) Signed: 10/05/2022 3:53:37 PM By: Samuella Bruin Entered By: Samuella Bruin on 10/05/2022 13:31:42 -------------------------------------------------------------------------------- Nutrition Risk Screening Details Patient Name: Date of Service: PA LESSLY, CHIANG 10/05/2022 1:30 PM Medical Record Number: [id number] Patient Account Number: [id number] Date of Birth/Sex: Treating RN: 11-06-1940 (82 y.o. [name] Primary Care Reyanna Baley: [name] Other Clinician: Referring Chasmine Lender: Treating Aima Mcwhirt/Extender: Wallis Bamberg Weeks in Treatment: 0 Height (in): 61 Weight (lbs): 149 Body Mass Index (BMI): 28.2 SAVINA, BARRIOS R ([id number]) [phone] [id number] Page 4 of 4 Nutrition Risk Screening Items Score Screening NUTRITION RISK SCREEN: I have an illness or condition that made me change the kind and/or amount of food I eat 0 No I eat fewer than two meals per day 0 No I eat few fruits and vegetables, or milk products 2 Yes I have three or more drinks of beer, liquor or wine almost every day 0 No I have tooth or mouth problems that make it hard for me to eat 0 No I don't always have enough money to buy the food I need 0 No I eat alone most of the time 0 No I take three or more different prescribed or over-the-counter drugs a day 1 Yes Without wanting to, I have lost or gained 10 pounds in the last six months 0 No I am not always physically able to shop, cook and/or feed myself 0 No Nutrition Protocols Good Risk Protocol Moderate Risk Protocol 0 Provide education on nutrition High  Risk Proctocol Risk Level: Moderate Risk Score: 3 Electronic Signature(s) Signed: 10/05/2022 3:53:37 PM By: Samuella Bruin Entered By: Samuella Bruin on 10/05/2022 13:31:39

## 2022-10-05 NOTE — Progress Notes (Signed)
Shannon Scott, Shannon Scott ([id number]) [id number] Page 1 of 6 Visit Report for 10/05/2022 Chief Complaint Document Details Patient Name: Date of Service: PA Shannon Scott, Shannon Scott 10/05/2022 1:30 PM Medical Record Number: [id number] Patient Account Number: [id number] Date of Birth/Sex: Treating RN: 30-Sep-1940 (82 y.o. F) Primary Care Provider: [name] Other Clinician: Referring Provider: Treating Provider/Extender: Jeanmarie Plant in Treatment: 0 Information Obtained from: Patient Chief Complaint Patient presents to the wound care center due with non-wound condition(s)--lymphedema stage III Electronic Signature(s) Signed: 10/05/2022 2:31:06 PM By: Duanne Guess MD FACS Entered By: Duanne Guess on 10/05/2022 14:31:06 -------------------------------------------------------------------------------- HPI Details Patient Name: Date of Service: PA Shannon Mires R. 10/05/2022 1:30 PM Medical Record Number: [id number] Patient Account Number: [id number] Date of Birth/Sex: Treating RN: 1940-08-07 (82 y.o. F) Primary Care Provider: [name] Other Clinician: Referring Provider: Treating Provider/Extender: Wallis Bamberg Weeks in Treatment: 0 History of Present Illness HPI Description: CONSULTATION ONLY 01/05/2022 This is an 82 year old woman with multiple medical comorbidities including aortic stenosis, coronary artery disease status post multivessel CABG, endometrial carcinoma status post robotic total hysterectomy with bilateral salpingo-oophorectomy and sentinel node biopsy with subsequent radiation and chemotherapy, atrial fibrillation, stroke, colonic mass status post diverting loop colostomy, prior left lower extremity DVT chronic pain, and intolerance to multiple medications. , She presents to the wound care center today without any open wounds but she does have stage III severe lymphedema,  left leg worse than right. She has skin changes and stasis dermatitis consistent with longstanding lymphatic obstruction, likely secondary to her malignancies and subsequent treatment. She is physically incapable of vigorous exercise due to her coronary artery disease and aortic stenosis, but she has been seen in a local lymphedema clinic and compression wraps have been applied. Nonetheless, she continues to have severe swelling of her legs. She is intolerant of diuretics apparently. She has not used lymphedema pumps in the past. CONSULTATION ONLY 10/05/2022 She returns today without any open wounds, again. She did receive her lymphedema pumps and has been using them. She is accompanied by 2 individuals who have been assisting her. It sounds like they were concerned about some skin changes on her left leg. They have not been able to get a compression stocking on this leg, and have struggled with the foot component of her Velcro wraps. The skin on her leg is very dry, but there are no open wounds nor any drainage. Electronic Signature(s) Signed: 10/05/2022 2:35:05 PM By: Duanne Guess MD FACS Entered By: Duanne Guess on 10/05/2022 14:35:05 -------------------------------------------------------------------------------- Physical Exam Details Patient Name: Date of Service: PA Shannon Mires R. 10/05/2022 1:30 PM Medical Record Number: [id number] Patient Account Number: [id number] Date of Birth/Sex: Treating RN: 1940-10-03 (82 y.o. F) Primary Care Provider: [name] Other Clinician: Referring Provider: Treating Provider/Extender: Abel Presto, Rupashree Weeks in Treatment: 0 Constitutional Hypertensive, asymptomatic. . . . No acute distress. Shannon Scott, Shannon Scott ([id number]) [id number] Page 2 of 6 Respiratory Normal work of breathing on room air. Cardiovascular 3+ nonpitting edema on the left and 2+ nonpitting edema on the right. Skin  changes consistent with chronic lymphedema.. Notes She has no open wounds. Electronic Signature(s) Signed: 10/05/2022 2:36:43 PM By: Duanne Guess MD FACS Entered By: Duanne Guess on 10/05/2022 14:36:43 -------------------------------------------------------------------------------- Physician Orders Details Patient Name: Date of Service: PA Shannon Mires R. 10/05/2022 1:30 PM Medical Record Number: [id number] Patient Account Number: [id number] Date of Birth/Sex: Treating RN: Dec 13, 1940 (82 y.o. [name] Primary Care Provider: [name] Other  Clinician: Referring Provider: Treating Provider/Extender: Jeanmarie Plant in Treatment: 0 Verbal / Phone Orders: No Diagnosis Coding ICD-10 Coding Code Description I89.0 Lymphedema, not elsewhere classified Discharge From [facility] Services Discharge from Wound Care Center Edema Control - Lymphedema / SCD / Other Lymphedema Pumps. Use Lymphedema pumps on leg(s) 2-3 times a day for 45-60 minutes. If wearing any wraps or hose, do not remove them. Continue exercising as instructed. - 2-3 time a day Elevate legs to the level of the heart or above for 30 minutes daily and/or when sitting for 3-4 times a day throughout the day. Avoid standing for long periods of time. Patient to wear own compression stockings every day. Moisturize legs daily. Electronic Signature(s) Signed: 10/05/2022 2:37:19 PM By: Duanne Guess MD FACS Entered By: Duanne Guess on 10/05/2022 14:37:18 -------------------------------------------------------------------------------- Problem List Details Patient Name: Date of Service: PA Shannon Mires R. 10/05/2022 1:30 PM Medical Record Number: [id number] Patient Account Number: [id number] Date of Birth/Sex: Treating RN: February 17, 1941 (82 y.o. F) Primary Care Provider: [name] Other Clinician: Referring Provider: Treating Provider/Extender: Wallis Bamberg Weeks in Treatment: 0 Active Problems ICD-10 Encounter Code Description Active Date MDM Diagnosis I89.0 Lymphedema, not elsewhere classified 10/05/2022 No Yes Inactive Problems Resolved Problems Shannon Scott, Shannon Scott ([id number]) [id number] Page 3 of 6 Electronic Signature(s) Signed: 10/05/2022 2:23:02 PM By: Duanne Guess MD FACS Entered By: Duanne Guess on 10/05/2022 14:23:02 -------------------------------------------------------------------------------- Progress Note Details Patient Name: Date of Service: PA Shannon Scott, Shannon R. 10/05/2022 1:30 PM Medical Record Number: [id number] Patient Account Number: [id number] Date of Birth/Sex: Treating RN: 1940-08-23 (82 y.o. F) Primary Care Provider: [name] Other Clinician: Referring Provider: Treating Provider/Extender: Jeanmarie Plant in Treatment: 0 Subjective Chief Complaint Information obtained from Patient Patient presents to the wound care center due with non-wound condition(s)--lymphedema stage III History of Present Illness (HPI) CONSULTATION ONLY 01/05/2022 This is an 82 year old woman with multiple medical comorbidities including aortic stenosis, coronary artery disease status post multivessel CABG, endometrial carcinoma status post robotic total hysterectomy with bilateral salpingo-oophorectomy and sentinel node biopsy with subsequent radiation and chemotherapy, atrial fibrillation, stroke, colonic mass status post diverting loop colostomy, prior left lower extremity DVT chronic pain, and intolerance to multiple medications. , She presents to the wound care center today without any open wounds but she does have stage III severe lymphedema, left leg worse than right. She has skin changes and stasis dermatitis consistent with longstanding lymphatic obstruction, likely secondary to her malignancies and subsequent treatment. She  is physically incapable of vigorous exercise due to her coronary artery disease and aortic stenosis, but she has been seen in a local lymphedema clinic and compression wraps have been applied. Nonetheless, she continues to have severe swelling of her legs. She is intolerant of diuretics apparently. She has not used lymphedema pumps in the past. CONSULTATION ONLY 10/05/2022 She returns today without any open wounds, again. She did receive her lymphedema pumps and has been using them. She is accompanied by 2 individuals who have been assisting her. It sounds like they were concerned about some skin changes on her left leg. They have not been able to get a compression stocking on this leg, and have struggled with the foot component of her Velcro wraps. The skin on her leg is very dry, but there are no open wounds nor any drainage. Patient History Information obtained from Patient. Allergies Statins-HMG-CoA Reductase Inhibitors, ciprofloxacin Family History Cancer - Siblings, Heart Disease - Mother,Father, Hypertension - Mother,Father, Stroke -  Father, No family history of Diabetes, Hereditary Spherocytosis, Kidney Disease, Lung Disease, Seizures, Thyroid Problems, Tuberculosis. Social History Never smoker, Marital Status - Widowed, Alcohol Use - Never, Drug Use - No History, Caffeine Use - Never. Medical History Respiratory Patient has history of Asthma Cardiovascular Patient has history of Arrhythmia - a-fib, Coronary Artery Disease, Hypertension Musculoskeletal Patient has history of Osteoarthritis Oncologic Patient has history of Received Chemotherapy, Received Radiation Hospitalization/Surgery History - total hysterectomy with bilateral salpingo oophorectomy. - cystoscopy w/ ureteral stent placement. - IR fluro guide port insertion. - transthoracic echocardiogram. - coronary artery bypass graft. - tonsillectomy. - tubal ligation. - IR US guide vac access right. Medical A Surgical History  Notes nd Endocrine hypothyroidism Musculoskeletal BURSITIS IN L HIP Neurologic stroke Review of Systems (ROS) Constitutional Symptoms (General Health) Denies complaints or symptoms of Fatigue, Fever, Chills, Marked Weight Change. Eyes Denies complaints or symptoms of Dry Eyes, Vision Changes, Glasses / Contacts. Ear/Nose/Mouth/Throat Denies complaints or symptoms of Chronic sinus problems or rhinitis. Shannon Scott, Shannon Scott ([id number]) [id number] Page 4 of 6 Gastrointestinal Denies complaints or symptoms of Frequent diarrhea, Nausea, Vomiting. Genitourinary Denies complaints or symptoms of Frequent urination. Integumentary (Skin) Denies complaints or symptoms of Wounds. Objective Constitutional Hypertensive, asymptomatic. No acute distress. Vitals Time Taken: 1:26 AM, Height: 61 in, Weight: 149 lbs, BMI: 28.2, Temperature: 98.5 F, Pulse: 72 bpm, Respiratory Rate: 20 breaths/min, Blood Pressure: 164/68 mmHg. Respiratory Normal work of breathing on room air. Cardiovascular 3+ nonpitting edema on the left and 2+ nonpitting edema on the right. Skin changes consistent with chronic lymphedema.. General Notes: She has no open wounds. Assessment Active Problems ICD-10 Lymphedema, not elsewhere classified Plan Discharge From [facility] Services: Discharge from Wound Care Center Edema Control - Lymphedema / SCD / Other: Lymphedema Pumps. Use Lymphedema pumps on leg(s) 2-3 times a day for 45-60 minutes. If wearing any wraps or hose, do not remove them. Continue exercising as instructed. - 2-3 time a day Elevate legs to the level of the heart or above for 30 minutes daily and/or when sitting for 3-4 times a day throughout the day. Avoid standing for long periods of time. Patient to wear own compression stockings every day. Moisturize legs daily. 10/05/2022: She returns again without any open wounds. We discussed the importance of wearing her compression garment.  One of the concerns has been that she could not get the supplied stocking on her leg but she was reassured that any stocking that would fit would be acceptable to use underneath the neoprene and Velcro wrap component. She does have her lymphedema pumps and she was encouraged to use them religiously. As for the changes in her skin that were of initial concern, she does not have any open wounds. Her skin is quite dry, however. We have recommended liberal use of a moisturizing lotion and discussed with her and the individuals that accompanied her the reasoning behind keeping her skin very supple. No need for follow-up at this time, but they are certainly welcome to contact us with any questions or concerns going forward. Electronic Signature(s) Signed: 10/05/2022 2:39:49 PM By: Duanne Guess MD FACS Entered By: Duanne Guess on 10/05/2022 14:39:48 -------------------------------------------------------------------------------- HxROS Details Patient Name: Date of Service: PA Shannon Scott, Emmory R. 10/05/2022 1:30 PM Medical Record Number: [id number] Patient Account Number: [id number] Date of Birth/Sex: Treating RN: 1941-03-09 (82 y.o. [name] Primary Care Provider: [name] Other Clinician: SHAWNAE, Shannon Scott ([id number]) [id number] Page 5 of 6 Referring Provider: Treating Provider/Extender: Abel Presto, Soyla Murphy  Weeks in Treatment: 0 Information Obtained From Patient Constitutional Symptoms (General Health) Complaints and Symptoms: Negative for: Fatigue; Fever; Chills; Marked Weight Change Eyes Complaints and Symptoms: Negative for: Dry Eyes; Vision Changes; Glasses / Contacts Ear/Nose/Mouth/Throat Complaints and Symptoms: Negative for: Chronic sinus problems or rhinitis Gastrointestinal Complaints and Symptoms: Negative for: Frequent diarrhea; Nausea; Vomiting Genitourinary Complaints and Symptoms: Negative for:  Frequent urination Integumentary (Skin) Complaints and Symptoms: Negative for: Wounds Hematologic/Lymphatic Respiratory Medical History: Positive for: Asthma Cardiovascular Medical History: Positive for: Arrhythmia - a-fib; Coronary Artery Disease; Hypertension Endocrine Medical History: Past Medical History Notes: hypothyroidism Immunological Musculoskeletal Medical History: Positive for: Osteoarthritis Past Medical History Notes: BURSITIS IN L HIP Neurologic Medical History: Past Medical History Notes: stroke Oncologic Medical History: Positive for: Received Chemotherapy; Received Radiation Immunizations Pneumococcal Vaccine: Shannon Scott, Shannon Scott ([id number]) [id number] Page 6 of 6 Received Pneumococcal Vaccination: No Implantable Devices None Hospitalization / Surgery History Type of Hospitalization/Surgery total hysterectomy with bilateral salpingo oophorectomy cystoscopy w/ ureteral stent placement IR fluro guide port insertion transthoracic echocardiogram coronary artery bypass graft tonsillectomy tubal ligation IR US guide vac access right Family and Social History Cancer: Yes - Siblings; Diabetes: No; Heart Disease: Yes - Mother,Father; Hereditary Spherocytosis: No; Hypertension: Yes - Mother,Father; Kidney Disease: No; Lung Disease: No; Seizures: No; Stroke: Yes - Father; Thyroid Problems: No; Tuberculosis: No; Never smoker; Marital Status - Widowed; Alcohol Use: Never; Drug Use: No History; Caffeine Use: Never; Financial Concerns: No; Food, Clothing or Shelter Needs: No; Support System Lacking: No; Transportation Concerns: No Electronic Signature(s) Signed: 10/05/2022 3:53:37 PM By: Samuella Bruin Signed: 10/05/2022 3:59:13 PM By: Duanne Guess MD FACS Entered By: Samuella Bruin on 10/05/2022 13:29:56 -------------------------------------------------------------------------------- SuperBill Details Patient Name: Date of  Service: PA Shannon Curb. 10/05/2022 Medical Record Number: [id number] Patient Account Number: [id number] Date of Birth/Sex: Treating RN: [birth year] (82 y.o. [name] Primary Care Provider: [name] Other Clinician: Referring Provider: Treating Provider/Extender: Wallis Bamberg Weeks in Treatment: 0 Diagnosis Coding ICD-10 Codes Code Description I89.0 Lymphedema, not elsewhere classified Facility Procedures : CPT4 Code: [id number] Description: 9213 - WOUND CARE VISIT-LEV 3 EST PT Modifier: Quantity: 1 Physician Procedures : CPT4 Code Description Modifier [id number] - WC PHYS LEVEL 3 - EST PT ICD-10 Diagnosis Description I89.0 Lymphedema, not elsewhere classified Quantity: 1 Electronic Signature(s) Signed: 10/05/2022 2:40:07 PM By: Duanne Guess MD FACS Entered By: Duanne Guess on 10/05/2022 14:40:06

## 2022-10-05 NOTE — Progress Notes (Signed)
Shannon Scott, Shannon Scott ([id number]) [id number] Page 1 of 6 Visit Report for 10/05/2022 Allergy List Details Patient Name: Date of Service: PA ANDREW, CISLO 10/05/2022 1:30 PM Medical Record Number: [id number] Patient Account Number: [id number] Date of Birth/Sex: Treating RN: Dec 25, 1940 (82 y.o. Shannon Scott Primary Care [name]: [name] Other Clinician: Referring Terrin Meddaugh: Treating Zylpha Poynor/Extender: Abel Presto, Rupashree Weeks in Treatment: 0 Allergies Active Allergies Statins-HMG-CoA Reductase Inhibitors ciprofloxacin Allergy Notes Electronic Signature(s) Signed: 10/05/2022 3:53:37 PM By: Samuella Bruin Entered By: Samuella Bruin on 10/05/2022 13:29:26 -------------------------------------------------------------------------------- Arrival Information Details Patient Name: Date of Service: PA Shannon Mires R. 10/05/2022 1:30 PM Medical Record Number: [id number] Patient Account Number: [id number] Date of Birth/Sex: Treating RN: 13-Aug-1940 (83 y.o. F) Primary Care [name]: [name] Other Clinician: Referring Ernisha Sorn: Treating [name]/Extender: Jeanmarie Plant in Treatment: 0 Visit Information Patient Arrived: Ambulatory Arrival Time: 13:25 Accompanied By: kids Transfer Assistance: None Patient Identification Verified: Yes Secondary Verification Process Completed: Yes History Since Last Visit All ordered tests and consults were completed: No Added or deleted any medications: No Any new allergies or adverse reactions: No Had a fall or experienced change in activities of daily living that may affect risk of falls: No Signs or symptoms of abuse/neglect since last visito No Hospitalized since last visit: No Implantable device outside of the clinic excluding cellular tissue based products placed in the center since last visit: No Pain Present Now: No Electronic  Signature(s) Signed: 10/05/2022 3:11:47 PM By: Dayton Scrape Entered By: Dayton Scrape on 10/05/2022 13:26:09 -------------------------------------------------------------------------------- Clinic Level of Care Assessment Details Patient Name: Date of Service: PA FALENA, WINTERFELD 10/05/2022 1:30 PM Medical Record Number: [id number] Patient Account Number: [id number] Date of Birth/Sex: Treating RN: [birth year] (82 y.o. Shannon Scott Primary Care [name]: [name] Other Clinician: Referring Adhvik Canady: Treating Kellianne Ek/Extender: Wallis Bamberg Weeks in Treatment: 0 Clinic Level of Care Assessment Items TOOL 2 Quantity Score KHRYSTA, BUNTS R ([id number]) [id number] Page 2 of 6 X- 1 0 Use when only an EandM is performed on the INITIAL visit ASSESSMENTS - Nursing Assessment / Reassessment X- 1 20 General Physical Exam (combine w/ comprehensive assessment (listed just below) when performed on new pt. evals) X- 1 25 Comprehensive Assessment (HX, ROS, Risk Assessments, Wounds Hx, etc.) ASSESSMENTS - Wound and Skin A ssessment / Reassessment []  - 0 Simple Wound Assessment / Reassessment - one wound []  - 0 Complex Wound Assessment / Reassessment - multiple wounds []  - 0 Dermatologic / Skin Assessment (not related to wound area) ASSESSMENTS - Ostomy and/or Continence Assessment and Care []  - 0 Incontinence Assessment and Management []  - 0 Ostomy Care Assessment and Management (repouching, etc.) PROCESS - Coordination of Care X - Simple Patient / Family Education for ongoing care 1 15 []  - 0 Complex (extensive) Patient / Family Education for ongoing care X- 1 10 Staff obtains Chiropractor, Records, T Results / Process Orders est []  - 0 Staff telephones HHA, Nursing Homes / Clarify orders / etc []  - 0 Routine Transfer to another Facility (non-emergent condition) []  - 0 Routine Hospital Admission (non-emergent  condition) X- 1 15 New Admissions / Manufacturing engineer / Ordering NPWT Apligraf, etc. , []  - 0 Emergency Hospital Admission (emergent condition) X- 1 10 Simple Discharge Coordination []  - 0 Complex (extensive) Discharge Coordination PROCESS - Special Needs []  - 0 Pediatric / Minor Patient Management []  - 0 Isolation Patient Management []  - 0 Hearing / Language / Visual special needs []  -  0 Assessment of Community assistance (transportation, D/C planning, etc.) []  - 0 Additional assistance / Altered mentation []  - 0 Support Surface(s) Assessment (bed, cushion, seat, etc.) INTERVENTIONS - Wound Cleansing / Measurement []  - 0 Wound Imaging (photographs - any number of wounds) []  - 0 Wound Tracing (instead of photographs) []  - 0 Simple Wound Measurement - one wound []  - 0 Complex Wound Measurement - multiple wounds []  - 0 Simple Wound Cleansing - one wound []  - 0 Complex Wound Cleansing - multiple wounds INTERVENTIONS - Wound Dressings []  - 0 Small Wound Dressing one or multiple wounds []  - 0 Medium Wound Dressing one or multiple wounds []  - 0 Large Wound Dressing one or multiple wounds []  - 0 Application of Medications - injection INTERVENTIONS - Miscellaneous []  - 0 External ear exam []  - 0 Specimen Collection (cultures, biopsies, blood, body fluids, etc.) []  - 0 Specimen(s) / Culture(s) sent or taken to Lab for analysis CARMEL, SURRENCY ([id number]) [id number] Page 3 of 6 []  - 0 Patient Transfer (multiple staff / Nurse, adult / Similar devices) []  - 0 Simple Staple / Suture removal (25 or less) []  - 0 Complex Staple / Suture removal (26 or more) []  - 0 Hypo / Hyperglycemic Management (close monitor of Blood Glucose) []  - 0 Ankle / Brachial Index (ABI) - do not check if billed separately Has the patient been seen at the hospital within the last three years: Yes Total Score: 95 Level Of Care: New/Established - Level  3 Electronic Signature(s) Signed: 10/05/2022 3:53:37 PM By: Samuella Bruin Entered By: Samuella Bruin on 10/05/2022 13:49:15 -------------------------------------------------------------------------------- Encounter Discharge Information Details Patient Name: Date of Service: PA Shannon Mires R. 10/05/2022 1:30 PM Medical Record Number: [id number] Patient Account Number: [id number] Date of Birth/Sex: Treating RN: [birth year] (82 y.o. Shannon Scott Primary Care Hayden Mabin: [name] Other Clinician: Referring Jayton Popelka: Treating Alda Gaultney/Extender: Jeanmarie Plant in Treatment: 0 Encounter Discharge Information Items Discharge Condition: Stable Ambulatory Status: Cane Discharge Destination: Home Transportation: Private Auto Accompanied By: children Schedule Follow-up Appointment: Yes Clinical Summary of Care: Patient Declined Electronic Signature(s) Signed: 10/05/2022 3:53:37 PM By: Gelene Mink By: Samuella Bruin on 10/05/2022 13:49:48 -------------------------------------------------------------------------------- Lower Extremity Assessment Details Patient Name: Date of Service: PA TIMIYAH, MIRCHANDANI. 10/05/2022 1:30 PM Medical Record Number: [id number] Patient Account Number: [id number] Date of Birth/Sex: Treating RN: 1941/02/11 (82 y.o. Shannon Scott Primary Care Peighton Edgin: [name] Other Clinician: Referring Keir Viernes: Treating Ettamae Barkett/Extender: Wallis Bamberg Weeks in Treatment: 0 Edema Assessment Assessed: [Left: No] [Right: No] [Left: Edema] [Right: :] Calf Left: Right: Point of Measurement: From Medial Instep 52.2 cm 37.5 cm Ankle Left: Right: Point of Measurement: From Medial Instep 40.5 cm 31.3 cm Knee To Floor Left: Right: From Medial Instep 41 cm 41 cm Vascular Assessment DIASHA, LOBASSO R ([id number]) [Right:[id number]  Page 4 of 6] Pulses: Dorsalis Pedis Palpable: [Left:Yes] [Right:Yes] Electronic Signature(s) Signed: 10/05/2022 3:53:37 PM By: Samuella Bruin Entered By: Samuella Bruin on 10/05/2022 13:38:57 -------------------------------------------------------------------------------- Multi Wound Chart Details Patient Name: Date of Service: PA Shannon Mires R. 10/05/2022 1:30 PM Medical Record Number: [id number] Patient Account Number: [id number] Date of Birth/Sex: Treating RN: [birth year] (82 y.o. F) Primary Care Elexa Kivi: [name] Other Clinician: Referring Win Guajardo: Treating Dov Dill/Extender: Abel Presto, Rupashree Weeks in Treatment: 0 Vital Signs Height(in): 61 Pulse(bpm): 72 Weight(lbs): 149 Blood Pressure(mmHg): 164/68 Body Mass Index(BMI): 28.2 Temperature(F): 98.5 Respiratory Rate(breaths/min): 20 [Treatment Notes:Wound Assessments Treatment Notes] Electronic Signature(s) Signed: 10/05/2022 2:30:56 PM  By: Duanne Guess MD FACS Entered By: Duanne Guess on 10/05/2022 14:30:56 -------------------------------------------------------------------------------- Multi-Disciplinary Care Plan Details Patient Name: Date of Service: PA [place], [place] R. 10/05/2022 1:30 PM Medical Record Number: [id number] Patient Account Number: [id number] Date of Birth/Sex: Treating RN: [birth year] (82 y.o. Shannon Scott Primary Care Rowen Hur: [name] Other Clinician: Referring Naydene Kamrowski: Treating Emree Locicero/Extender: Wallis Bamberg Weeks in Treatment: 0 Active Inactive Electronic Signature(s) Signed: 10/05/2022 3:53:37 PM By: Gelene Mink By: Samuella Bruin on 10/05/2022 13:42:56 -------------------------------------------------------------------------------- Pain Assessment Details Patient Name: Date of Service: PA KAMYIAH, MERSHON 10/05/2022 1:30 PM Medical Record Number: [id number] Patient Account  Number: [id number] Date of Birth/Sex: Treating RN: [birth year] (82 y.o. F) Primary Care Trevan Messman: [name] Other Clinician: Referring Nikki Glanzer: Treating Terran Hollenkamp/Extender: Wallis Bamberg Weeks in Treatment: 0 Active Problems Location of Pain Severity and Description of Pain Patient Has Paino No ZYKERA, MROSS ([id number]) [id number] Page 5 of 6 Patient Has Paino No Site Locations Pain Management and Medication Current Pain Management: Electronic Signature(s) Signed: 10/05/2022 3:11:47 PM By: Dayton Scrape Entered By: Dayton Scrape on 10/05/2022 13:27:54 -------------------------------------------------------------------------------- Patient/Caregiver Education Details Patient Name: Date of Service: PA Crista Curb 5/7/2024andnbsp1:30 PM Medical Record Number: [id number] Patient Account Number: [id number] Date of Birth/Gender: Treating RN: 09/12/1940 (82 y.o. Shannon Scott Primary Care Physician: [name] Other Clinician: Referring Physician: Treating Physician/Extender: Jeanmarie Plant in Treatment: 0 Education Assessment Education Provided To: Patient Education Topics Provided Venous: Methods: Explain/Verbal Responses: Reinforcements needed, State content correctly Electronic Signature(s) Signed: 10/05/2022 3:53:37 PM By: Samuella Bruin Entered By: Samuella Bruin on 10/05/2022 13:43:10 -------------------------------------------------------------------------------- Vitals Details Patient Name: Date of Service: PA Shannon Mires R. 10/05/2022 1:30 PM Medical Record Number: [id number] Patient Account Number: [id number] Date of Birth/Sex: Treating RN: 12/23/1940 (82 y.o. F) Primary Care Osceola Depaz: [name] Other Clinician: Referring Adria Costley: Treating Haroldine Redler/Extender: Wallis Bamberg Weeks in Treatment: 0 Vital  Signs Time Taken: 01:26 Temperature (F): 98.5 Height (in): 61 Pulse (bpm): 72 Weight (lbs): 149 Respiratory Rate (breaths/min): 20 Islam, Shenica R ([id number]) [id number] Page 6 of 6 Body Mass Index (BMI): 28.2 Blood Pressure (mmHg): 164/68 Reference Range: 80 - 120 mg / dl Electronic Signature(s) Signed: 10/05/2022 3:11:47 PM By: Dayton Scrape Entered By: Dayton Scrape on 10/05/2022 13:27:30

## 2022-10-07 ENCOUNTER — Other Ambulatory Visit: Payer: Self-pay | Admitting: *Deleted

## 2022-10-07 DIAGNOSIS — D649 Anemia, unspecified: Secondary | ICD-10-CM

## 2022-10-07 DIAGNOSIS — C541 Malignant neoplasm of endometrium: Secondary | ICD-10-CM

## 2022-10-07 DIAGNOSIS — Z95828 Presence of other vascular implants and grafts: Secondary | ICD-10-CM

## 2022-10-07 DIAGNOSIS — C539 Malignant neoplasm of cervix uteri, unspecified: Secondary | ICD-10-CM

## 2022-10-07 DIAGNOSIS — D508 Other iron deficiency anemias: Secondary | ICD-10-CM

## 2022-10-08 ENCOUNTER — Other Ambulatory Visit: Payer: Self-pay

## 2022-10-08 ENCOUNTER — Inpatient Hospital Stay: Payer: Medicare Other

## 2022-10-08 ENCOUNTER — Inpatient Hospital Stay: Payer: Medicare Other | Attending: Hematology & Oncology

## 2022-10-08 ENCOUNTER — Inpatient Hospital Stay (HOSPITAL_BASED_OUTPATIENT_CLINIC_OR_DEPARTMENT_OTHER): Payer: Medicare Other | Admitting: Hematology & Oncology

## 2022-10-08 ENCOUNTER — Encounter: Payer: Self-pay | Admitting: Hematology & Oncology

## 2022-10-08 ENCOUNTER — Encounter: Payer: Self-pay | Admitting: *Deleted

## 2022-10-08 VITALS — BP 166/64 | HR 76 | Temp 98.1°F | Resp 18 | Ht 61.0 in | Wt 152.0 lb

## 2022-10-08 DIAGNOSIS — C539 Malignant neoplasm of cervix uteri, unspecified: Secondary | ICD-10-CM

## 2022-10-08 DIAGNOSIS — Z1509 Genetic susceptibility to other malignant neoplasm: Secondary | ICD-10-CM | POA: Diagnosis not present

## 2022-10-08 DIAGNOSIS — Z923 Personal history of irradiation: Secondary | ICD-10-CM | POA: Insufficient documentation

## 2022-10-08 DIAGNOSIS — Z79899 Other long term (current) drug therapy: Secondary | ICD-10-CM | POA: Diagnosis not present

## 2022-10-08 DIAGNOSIS — Z95828 Presence of other vascular implants and grafts: Secondary | ICD-10-CM

## 2022-10-08 DIAGNOSIS — Z7951 Long term (current) use of inhaled steroids: Secondary | ICD-10-CM | POA: Diagnosis not present

## 2022-10-08 DIAGNOSIS — I890 Lymphedema, not elsewhere classified: Secondary | ICD-10-CM | POA: Insufficient documentation

## 2022-10-08 DIAGNOSIS — Z8542 Personal history of malignant neoplasm of other parts of uterus: Secondary | ICD-10-CM | POA: Insufficient documentation

## 2022-10-08 DIAGNOSIS — C799 Secondary malignant neoplasm of unspecified site: Secondary | ICD-10-CM | POA: Diagnosis not present

## 2022-10-08 DIAGNOSIS — D649 Anemia, unspecified: Secondary | ICD-10-CM

## 2022-10-08 DIAGNOSIS — C541 Malignant neoplasm of endometrium: Secondary | ICD-10-CM

## 2022-10-08 DIAGNOSIS — D508 Other iron deficiency anemias: Secondary | ICD-10-CM

## 2022-10-08 DIAGNOSIS — D509 Iron deficiency anemia, unspecified: Secondary | ICD-10-CM | POA: Insufficient documentation

## 2022-10-08 LAB — CBC WITH DIFFERENTIAL (CANCER CENTER ONLY)
Abs Immature Granulocytes: 0.06 10*3/uL (ref 0.00–0.07)
Basophils Absolute: 0 10*3/uL (ref 0.0–0.1)
Basophils Relative: 1 %
Eosinophils Absolute: 0.1 10*3/uL (ref 0.0–0.5)
Eosinophils Relative: 1 %
HCT: 31.8 % — ABNORMAL LOW (ref 36.0–46.0)
Hemoglobin: 10.1 g/dL — ABNORMAL LOW (ref 12.0–15.0)
Immature Granulocytes: 1 %
Lymphocytes Relative: 17 %
Lymphs Abs: 0.9 10*3/uL (ref 0.7–4.0)
MCH: 28 pg (ref 26.0–34.0)
MCHC: 31.8 g/dL (ref 30.0–36.0)
MCV: 88.1 fL (ref 80.0–100.0)
Monocytes Absolute: 0.4 10*3/uL (ref 0.1–1.0)
Monocytes Relative: 8 %
Neutro Abs: 3.7 10*3/uL (ref 1.7–7.7)
Neutrophils Relative %: 72 %
Platelet Count: 277 10*3/uL (ref 150–400)
RBC: 3.61 MIL/uL — ABNORMAL LOW (ref 3.87–5.11)
RDW: 14 % (ref 11.5–15.5)
WBC Count: 5.2 10*3/uL (ref 4.0–10.5)
nRBC: 0 % (ref 0.0–0.2)

## 2022-10-08 LAB — CMP (CANCER CENTER ONLY)
ALT: 6 U/L (ref 0–44)
AST: 12 U/L — ABNORMAL LOW (ref 15–41)
Albumin: 4.1 g/dL (ref 3.5–5.0)
Alkaline Phosphatase: 63 U/L (ref 38–126)
Anion gap: 8 (ref 5–15)
BUN: 13 mg/dL (ref 8–23)
CO2: 29 mmol/L (ref 22–32)
Calcium: 9.7 mg/dL (ref 8.9–10.3)
Chloride: 104 mmol/L (ref 98–111)
Creatinine: 0.87 mg/dL (ref 0.44–1.00)
GFR, Estimated: >60 mL/min (ref >60)
Glucose, Bld: 93 mg/dL (ref 70–99)
Potassium: 3.6 mmol/L (ref 3.5–5.1)
Sodium: 141 mmol/L (ref 135–145)
Total Bilirubin: 0.3 mg/dL (ref 0.3–1.2)
Total Protein: 6.9 g/dL (ref 6.5–8.1)

## 2022-10-08 LAB — RETICULOCYTES
Immature Retic Fract: 8.4 % (ref 2.3–15.9)
RBC.: 3.6 MIL/uL — ABNORMAL LOW (ref 3.87–5.11)
Retic Count, Absolute: 38.2 10*3/uL (ref 19.0–186.0)
Retic Ct Pct: 1.1 % (ref 0.4–3.1)

## 2022-10-08 LAB — IRON AND IRON BINDING CAPACITY (CC-WL,HP ONLY)
Iron: 23 ug/dL — ABNORMAL LOW (ref 28–170)
Saturation Ratios: 10 % — ABNORMAL LOW (ref 10.4–31.8)
TIBC: 230 ug/dL — ABNORMAL LOW (ref 250–450)
UIBC: 207 ug/dL (ref 148–442)

## 2022-10-08 LAB — FERRITIN: Ferritin: 128 ng/mL (ref 11–307)

## 2022-10-08 MED ORDER — HEPARIN SOD (PORK) LOCK FLUSH 100 UNIT/ML IV SOLN
500.0000 [IU] | Freq: Once | INTRAVENOUS | Status: AC | PRN
  Administered 2022-10-08: 500 [IU]

## 2022-10-08 MED ORDER — ZOLEDRONIC ACID 4 MG/100ML IV SOLN
4.0000 mg | Freq: Once | INTRAVENOUS | Status: AC
  Administered 2022-10-08: 4 mg via INTRAVENOUS
  Filled 2022-10-08: qty 100

## 2022-10-08 MED ORDER — SODIUM CHLORIDE 0.9 % IV SOLN: INTRAVENOUS | Status: DC

## 2022-10-08 NOTE — Patient Instructions (Signed)

## 2022-10-08 NOTE — Progress Notes (Signed)
Hematology and Oncology Follow Up Visit  Shannon Scott [id number] May 08, 1941 82 y.o. 10/08/2022   Principle Diagnosis:  Locally advanced/recurrent endometrial carcinoma undifferentiated --  TMB (HIGH) / MSI HIGH  Intermittent iron deficiency anemia   Past Therapy: Radiation therapy for 5 -5 1/2 weeks - s/p 20 fractions Taxol/carboplatinum q 7 days -- s/p cycle 6    Current Therapy:        Pembrolizumab 400 mg q 12 weeks, s/p cycle 20 (originally started on 01/23/2019 at 400 mg IV q 6 wk)  --on hold for patient request since April 2022 Zometa 4 mg IV q 3 months -- next dose due 01/18/2023 IV iron as indicated --Venofer given on 05/13/2022      Interim History:  Shannon Scott is here today for follow-up.  We last saw her back in February.  At that time, she did not want to have any CAT scans done.  The real problem that she has is this lymphedema that she has.  She has fairly significant lymphedema in the left leg.  There is little bit in the right leg.  I think she does see the [facility].  She said that she has a pump she puts on.  She has a colostomy.  This seems to be working quite well.  She has had no problems with cough or shortness of breath.  She has had no headache.  We did give her iron on occasion.  The last time that she got iron was back in December 2023.  She gets Venofer.  When we saw her back in February, her ferritin was 188 with an iron saturation of 16%.  She has not had any immunotherapy now for over 2 years.  She does not wish to have any immunotherapy.  She lets [place] know when she feels that she needs to have scans done.  I am sure that she will have a wonderful Mother's Day weekend.  Currently, I would say that her performance status is probably ECOG 2.   Wt Readings from Last 3 Encounters:  10/08/22 152 lb (68.9 kg)  09/03/22 149 lb 6.4 oz (67.8 kg)  07/29/22 149 lb (67.6 kg)   Medications:  Allergies as of 10/08/2022       Reactions    Statins Other (See Comments)   Myalgias and memory problems   Ciprofloxacin Itching, Other (See Comments)   Splotchy redness with itching during IV infusion localized to arm. Other reaction(s): itching/swelling/redness at injection site        Medication List        Accurate as of Oct 08, 2022  1:15 PM. If you have any questions, ask your nurse or doctor.          apixaban 5 MG Tabs tablet Commonly known as: ELIQUIS Take 1 tablet (5 mg total) by mouth 2 (two) times daily.   clobetasol ointment 0.05 % Commonly known as: TEMOVATE Apply 1 Application topically 2 (two) times a week.   diltiazem 300 MG 24 hr capsule Commonly known as: TIAZAC TAKE 1 CAPSULE(300 MG) BY MOUTH DAILY What changed: See the new instructions.   ezetimibe 10 MG tablet Commonly known as: ZETIA Take 10 mg by mouth daily.   levothyroxine 50 MCG tablet Commonly known as: SYNTHROID Take 1 tablet (50 mcg total) by mouth daily before breakfast.   mometasone 0.1 % cream Commonly known as: ELOCON Apply 1 Application topically daily as needed (to the vulva for irritation and itching).   nystatin  powder Commonly known as: MYCOSTATIN/NYSTOP Apply 1 Application topically 3 (three) times daily.   polyethylene glycol 17 g packet Commonly known as: MIRALAX / GLYCOLAX Take 17 g by mouth daily as needed for severe constipation. What changed: when to take this   Repatha SureClick 140 MG/ML Soaj Generic drug: Evolocumab INJECT 1 DOSE UNDER THE SKIN EVERY 14 DAYS   traMADol 50 MG tablet Commonly known as: ULTRAM TAKE 2 TABLETS(100 MG) BY MOUTH TWICE DAILY        Allergies:  Allergies  Allergen Reactions   Statins Other (See Comments)    Myalgias and memory problems   Ciprofloxacin Itching and Other (See Comments)    Splotchy redness with itching during IV infusion localized to arm. Other reaction(s): itching/swelling/redness at injection site    Past Medical History, Surgical history, Social  history, and Family History were reviewed and updated.  Review of Systems: Review of Systems  Constitutional: Negative.   HENT: Negative.    Eyes: Negative.   Respiratory: Negative.    Cardiovascular:  Positive for leg swelling.  Gastrointestinal: Negative.   Genitourinary: Negative.   Musculoskeletal:  Positive for back pain.  Skin: Negative.   Neurological: Negative.   Endo/Heme/Allergies: Negative.   Psychiatric/Behavioral: Negative.       Physical Exam:  height is 5\' 1"  (1.549 m) and weight is 152 lb (68.9 kg). Her oral temperature is 98.1 F (36.7 C). Her blood pressure is 166/64 (abnormal) and her pulse is 76. Her respiration is 18 and oxygen saturation is 96%.   Wt Readings from Last 3 Encounters:  10/08/22 152 lb (68.9 kg)  09/03/22 149 lb 6.4 oz (67.8 kg)  07/29/22 149 lb (67.6 kg)    Physical Exam Vitals reviewed.  HENT:     Head: Normocephalic and atraumatic.  Eyes:     Pupils: Pupils are equal, round, and reactive to light.  Cardiovascular:     Rate and Rhythm: Normal rate and regular rhythm.     Heart sounds: Normal heart sounds.     Comments: Cardiac exam shows a regular rate and rhythm.  She has a 2/6 systolic ejection murmur.   Pulmonary:     Effort: Pulmonary effort is normal.     Breath sounds: Normal breath sounds.  Abdominal:     General: Bowel sounds are normal.     Palpations: Abdomen is soft.  Musculoskeletal:        General: No tenderness or deformity. Normal range of motion.     Cervical back: Normal range of motion.     Comments: She has significant lymphedema in her legs.  She has probably 3+ in the left leg and 1+ in the right leg. She has pitting edema.  She has decent range of motion of her joints.  Lymphadenopathy:     Cervical: No cervical adenopathy.  Skin:    General: Skin is warm and dry.     Findings: No erythema or rash.  Neurological:     Mental Status: She is alert and oriented to person, place, and time.  Psychiatric:         Behavior: Behavior normal.        Thought Content: Thought content normal.        Judgment: Judgment normal.      Lab Results  Component Value Date   WBC 5.2 10/08/2022   HGB 10.1 (L) 10/08/2022   HCT 31.8 (L) 10/08/2022   MCV 88.1 10/08/2022   PLT 277 10/08/2022   Lab  Results  Component Value Date   FERRITIN 188 07/29/2022   IRON 36 07/29/2022   TIBC 231 (L) 07/29/2022   UIBC 195 07/29/2022   IRONPCTSAT 16 07/29/2022   Lab Results  Component Value Date   RETICCTPCT 1.1 10/08/2022   RBC 3.61 (L) 10/08/2022   RBC 3.60 (L) 10/08/2022   No results found for: "KPAFRELGTCHN", "LAMBDASER", "KAPLAMBRATIO" No results found for: "IGGSERUM", "IGA", "IGMSERUM" No results found for: "TOTALPROTELP", "ALBUMINELP", "A1GS", "A2GS", "BETS", "BETA2SER", "GAMS", "MSPIKE", "SPEI"   Chemistry      Component Value Date/Time   NA 140 07/29/2022 1000   NA 136 (A) 09/08/2017 0000   K 3.9 07/29/2022 1000   CL 104 07/29/2022 1000   CO2 28 07/29/2022 1000   BUN 13 07/29/2022 1000   BUN 8 09/08/2017 0000   CREATININE 0.80 07/29/2022 1000   CREATININE 0.69 08/02/2016 1519   GLU 111 09/08/2017 0000      Component Value Date/Time   CALCIUM 9.5 07/29/2022 1000   ALKPHOS 75 07/29/2022 1000   AST 13 (L) 07/29/2022 1000   ALT 7 07/29/2022 1000   BILITOT 0.3 07/29/2022 1000       Impression and Plan: Ms. Moulin is a very pleasant 81 yo caucasian female with recurrent undifferentiated endometrial carcinoma.  She has the Lynch syndrome.  She was on immunotherapy.  She tolerated this incredibly well.  It worked incredibly well.  Again, she decides when she would like to have scans done.  She says she may want to have a scan over the summertime.  Again she will let us know.  She will get her Zometa today.  We will plan to get her back in another 3 months or so.  It is always fun talking with her.  I feel bad about this lymphedema that she has.  I know this is a real aggravation for her.   I know that this affects her quality of life.  Josph Macho, MD 5/10/20241:15 PM

## 2022-10-08 NOTE — Patient Instructions (Signed)

## 2022-10-09 LAB — CA 125: Cancer Antigen (CA) 125: 11.8 U/mL (ref 0.0–38.1)

## 2022-10-14 ENCOUNTER — Other Ambulatory Visit: Payer: Self-pay | Admitting: Hematology & Oncology

## 2022-10-14 DIAGNOSIS — M545 Low back pain, unspecified: Secondary | ICD-10-CM

## 2022-10-14 DIAGNOSIS — M792 Neuralgia and neuritis, unspecified: Secondary | ICD-10-CM

## 2022-10-14 DIAGNOSIS — G8929 Other chronic pain: Secondary | ICD-10-CM

## 2022-10-14 DIAGNOSIS — C541 Malignant neoplasm of endometrium: Secondary | ICD-10-CM

## 2022-10-18 ENCOUNTER — Inpatient Hospital Stay: Payer: Medicare Other

## 2022-10-18 VITALS — BP 149/46 | HR 73 | Temp 98.8°F | Resp 20

## 2022-10-18 DIAGNOSIS — Z7951 Long term (current) use of inhaled steroids: Secondary | ICD-10-CM | POA: Diagnosis not present

## 2022-10-18 DIAGNOSIS — Z8542 Personal history of malignant neoplasm of other parts of uterus: Secondary | ICD-10-CM | POA: Diagnosis not present

## 2022-10-18 DIAGNOSIS — Z79899 Other long term (current) drug therapy: Secondary | ICD-10-CM | POA: Diagnosis not present

## 2022-10-18 DIAGNOSIS — D509 Iron deficiency anemia, unspecified: Secondary | ICD-10-CM | POA: Diagnosis not present

## 2022-10-18 DIAGNOSIS — D508 Other iron deficiency anemias: Secondary | ICD-10-CM

## 2022-10-18 DIAGNOSIS — I890 Lymphedema, not elsewhere classified: Secondary | ICD-10-CM | POA: Diagnosis not present

## 2022-10-18 DIAGNOSIS — Z923 Personal history of irradiation: Secondary | ICD-10-CM | POA: Diagnosis not present

## 2022-10-18 DIAGNOSIS — Z95828 Presence of other vascular implants and grafts: Secondary | ICD-10-CM

## 2022-10-18 MED ORDER — SODIUM CHLORIDE 0.9 % IV SOLN: Freq: Once | INTRAVENOUS | Status: AC

## 2022-10-18 MED ORDER — HEPARIN SOD (PORK) LOCK FLUSH 100 UNIT/ML IV SOLN
500.0000 [IU] | Freq: Once | INTRAVENOUS | Status: AC
  Administered 2022-10-18: 500 [IU] via INTRAVENOUS

## 2022-10-18 MED ORDER — SODIUM CHLORIDE 0.9% FLUSH
10.0000 mL | Freq: Once | INTRAVENOUS | Status: AC
  Administered 2022-10-18: 10 mL via INTRAVENOUS

## 2022-10-18 MED ORDER — SODIUM CHLORIDE 0.9 % IV SOLN
200.0000 mg | Freq: Once | INTRAVENOUS | Status: AC
  Administered 2022-10-18: 200 mg via INTRAVENOUS
  Filled 2022-10-18: qty 200

## 2022-10-18 NOTE — Patient Instructions (Signed)

## 2022-10-26 ENCOUNTER — Inpatient Hospital Stay: Payer: Medicare Other

## 2022-10-28 ENCOUNTER — Inpatient Hospital Stay: Payer: Medicare Other

## 2022-10-28 VITALS — BP 151/51 | HR 74 | Temp 98.3°F | Resp 18

## 2022-10-28 DIAGNOSIS — I890 Lymphedema, not elsewhere classified: Secondary | ICD-10-CM | POA: Diagnosis not present

## 2022-10-28 DIAGNOSIS — Z79899 Other long term (current) drug therapy: Secondary | ICD-10-CM | POA: Diagnosis not present

## 2022-10-28 DIAGNOSIS — Z8542 Personal history of malignant neoplasm of other parts of uterus: Secondary | ICD-10-CM | POA: Diagnosis not present

## 2022-10-28 DIAGNOSIS — D508 Other iron deficiency anemias: Secondary | ICD-10-CM

## 2022-10-28 DIAGNOSIS — Z7951 Long term (current) use of inhaled steroids: Secondary | ICD-10-CM | POA: Diagnosis not present

## 2022-10-28 DIAGNOSIS — D509 Iron deficiency anemia, unspecified: Secondary | ICD-10-CM | POA: Diagnosis not present

## 2022-10-28 DIAGNOSIS — Z923 Personal history of irradiation: Secondary | ICD-10-CM | POA: Diagnosis not present

## 2022-10-28 MED ORDER — HEPARIN SOD (PORK) LOCK FLUSH 100 UNIT/ML IV SOLN
500.0000 [IU] | Freq: Once | INTRAVENOUS | Status: AC
  Administered 2022-10-28: 500 [IU] via INTRAVENOUS

## 2022-10-28 MED ORDER — SODIUM CHLORIDE 0.9% FLUSH
10.0000 mL | Freq: Once | INTRAVENOUS | Status: AC
  Administered 2022-10-28: 10 mL via INTRAVENOUS

## 2022-10-28 MED ORDER — SODIUM CHLORIDE 0.9 % IV SOLN: Freq: Once | INTRAVENOUS | Status: AC

## 2022-10-28 MED ORDER — SODIUM CHLORIDE 0.9 % IV SOLN
200.0000 mg | Freq: Once | INTRAVENOUS | Status: AC
  Administered 2022-10-28: 200 mg via INTRAVENOUS
  Filled 2022-10-28: qty 200

## 2022-10-28 NOTE — Patient Instructions (Signed)

## 2022-10-29 DIAGNOSIS — Z933 Colostomy status: Secondary | ICD-10-CM | POA: Diagnosis not present

## 2022-10-29 DIAGNOSIS — K56609 Unspecified intestinal obstruction, unspecified as to partial versus complete obstruction: Secondary | ICD-10-CM | POA: Diagnosis not present

## 2022-11-24 DIAGNOSIS — K56609 Unspecified intestinal obstruction, unspecified as to partial versus complete obstruction: Secondary | ICD-10-CM | POA: Diagnosis not present

## 2022-11-24 DIAGNOSIS — Z933 Colostomy status: Secondary | ICD-10-CM | POA: Diagnosis not present

## 2022-11-25 DIAGNOSIS — R6889 Other general symptoms and signs: Secondary | ICD-10-CM | POA: Diagnosis not present

## 2022-11-25 DIAGNOSIS — D509 Iron deficiency anemia, unspecified: Secondary | ICD-10-CM | POA: Diagnosis not present

## 2022-11-25 DIAGNOSIS — I4891 Unspecified atrial fibrillation: Secondary | ICD-10-CM | POA: Diagnosis not present

## 2022-11-25 DIAGNOSIS — R54 Age-related physical debility: Secondary | ICD-10-CM | POA: Diagnosis not present

## 2022-11-25 DIAGNOSIS — L03116 Cellulitis of left lower limb: Secondary | ICD-10-CM | POA: Diagnosis not present

## 2022-11-26 ENCOUNTER — Ambulatory Visit: Payer: Medicare Other | Admitting: Podiatry

## 2022-11-26 ENCOUNTER — Encounter: Payer: Self-pay | Admitting: Podiatry

## 2022-11-26 DIAGNOSIS — L600 Ingrowing nail: Secondary | ICD-10-CM | POA: Diagnosis not present

## 2022-11-26 DIAGNOSIS — D689 Coagulation defect, unspecified: Secondary | ICD-10-CM

## 2022-12-01 DIAGNOSIS — K56609 Unspecified intestinal obstruction, unspecified as to partial versus complete obstruction: Secondary | ICD-10-CM | POA: Diagnosis not present

## 2022-12-01 DIAGNOSIS — Z933 Colostomy status: Secondary | ICD-10-CM | POA: Diagnosis not present

## 2022-12-09 ENCOUNTER — Other Ambulatory Visit: Payer: Self-pay | Admitting: Hematology & Oncology

## 2022-12-09 DIAGNOSIS — M792 Neuralgia and neuritis, unspecified: Secondary | ICD-10-CM

## 2022-12-09 DIAGNOSIS — G8929 Other chronic pain: Secondary | ICD-10-CM

## 2022-12-09 DIAGNOSIS — M545 Low back pain, unspecified: Secondary | ICD-10-CM

## 2022-12-09 DIAGNOSIS — C541 Malignant neoplasm of endometrium: Secondary | ICD-10-CM

## 2022-12-28 NOTE — Progress Notes (Signed)
Subjective:   Patient ID: Shannon Scott, female   DOB: 82 y.o.   MRN: [id number]   HPI Patient presents stating she has a toenail that is been ingrown on the left big toe and patient is on blood thinner cannot take care of this herself.  Patient states that it does get sore   ROS      Objective:  Physical Exam  Neurovascular status mildly diminished but intact digital perfusion adequate range of motion adequate with patient found to have an incurvated medial border of the left big toe that has mild localized redness and localized swelling with mild discomfort with patient on blood thinner     Assessment:  Low-grade paronychia infection left big toe     Plan:  Reviewed condition discussed and at this point I have recommended trying to be conservative with this and I did a careful debridement of the nail border flushed it out and apply dressing with instructions on soaks.  Patient will be seen back to recheck

## 2023-01-10 DIAGNOSIS — K56609 Unspecified intestinal obstruction, unspecified as to partial versus complete obstruction: Secondary | ICD-10-CM | POA: Diagnosis not present

## 2023-01-10 DIAGNOSIS — Z933 Colostomy status: Secondary | ICD-10-CM | POA: Diagnosis not present

## 2023-01-18 DIAGNOSIS — Z933 Colostomy status: Secondary | ICD-10-CM | POA: Diagnosis not present

## 2023-01-18 DIAGNOSIS — K56609 Unspecified intestinal obstruction, unspecified as to partial versus complete obstruction: Secondary | ICD-10-CM | POA: Diagnosis not present

## 2023-01-28 DIAGNOSIS — E039 Hypothyroidism, unspecified: Secondary | ICD-10-CM | POA: Diagnosis not present

## 2023-01-28 DIAGNOSIS — I700 Atherosclerosis of aorta: Secondary | ICD-10-CM | POA: Diagnosis not present

## 2023-01-28 DIAGNOSIS — Z8616 Personal history of COVID-19: Secondary | ICD-10-CM | POA: Diagnosis not present

## 2023-01-28 DIAGNOSIS — R54 Age-related physical debility: Secondary | ICD-10-CM | POA: Diagnosis not present

## 2023-01-28 DIAGNOSIS — I890 Lymphedema, not elsewhere classified: Secondary | ICD-10-CM | POA: Diagnosis not present

## 2023-01-28 DIAGNOSIS — D6869 Other thrombophilia: Secondary | ICD-10-CM | POA: Diagnosis not present

## 2023-01-28 DIAGNOSIS — I2510 Atherosclerotic heart disease of native coronary artery without angina pectoris: Secondary | ICD-10-CM | POA: Diagnosis not present

## 2023-01-28 DIAGNOSIS — Z0000 Encounter for general adult medical examination without abnormal findings: Secondary | ICD-10-CM | POA: Diagnosis not present

## 2023-01-28 DIAGNOSIS — I4891 Unspecified atrial fibrillation: Secondary | ICD-10-CM | POA: Diagnosis not present

## 2023-02-04 ENCOUNTER — Other Ambulatory Visit: Payer: Self-pay | Admitting: Hematology & Oncology

## 2023-02-04 DIAGNOSIS — G8929 Other chronic pain: Secondary | ICD-10-CM

## 2023-02-04 DIAGNOSIS — M792 Neuralgia and neuritis, unspecified: Secondary | ICD-10-CM

## 2023-02-04 DIAGNOSIS — C541 Malignant neoplasm of endometrium: Secondary | ICD-10-CM

## 2023-02-04 DIAGNOSIS — M545 Low back pain, unspecified: Secondary | ICD-10-CM

## 2023-03-01 ENCOUNTER — Encounter: Payer: Self-pay | Admitting: Hematology & Oncology

## 2023-03-01 ENCOUNTER — Other Ambulatory Visit: Payer: Self-pay

## 2023-03-01 ENCOUNTER — Inpatient Hospital Stay: Payer: Medicare Other | Attending: Hematology & Oncology

## 2023-03-01 ENCOUNTER — Emergency Department (HOSPITAL_BASED_OUTPATIENT_CLINIC_OR_DEPARTMENT_OTHER): Payer: Medicare Other

## 2023-03-01 ENCOUNTER — Inpatient Hospital Stay: Payer: Medicare Other

## 2023-03-01 ENCOUNTER — Encounter (HOSPITAL_BASED_OUTPATIENT_CLINIC_OR_DEPARTMENT_OTHER): Payer: Self-pay | Admitting: Emergency Medicine

## 2023-03-01 ENCOUNTER — Inpatient Hospital Stay: Payer: Medicare Other | Admitting: Hematology & Oncology

## 2023-03-01 ENCOUNTER — Inpatient Hospital Stay (HOSPITAL_BASED_OUTPATIENT_CLINIC_OR_DEPARTMENT_OTHER)
Admission: EM | Admit: 2023-03-01 | Discharge: 2023-03-04 | DRG: 280 | Disposition: A | Discharge status: Home / Self Care | Payer: Medicare Other | Attending: Internal Medicine | Admitting: Internal Medicine

## 2023-03-01 VITALS — BP 154/60 | HR 71 | Temp 97.9°F | Resp 19 | Ht 61.0 in | Wt 153.0 lb

## 2023-03-01 VITALS — BP 157/47 | HR 82 | Temp 98.0°F | Resp 19 | Ht 61.0 in | Wt 153.0 lb

## 2023-03-01 DIAGNOSIS — I700 Atherosclerosis of aorta: Secondary | ICD-10-CM | POA: Diagnosis not present

## 2023-03-01 DIAGNOSIS — I890 Lymphedema, not elsewhere classified: Secondary | ICD-10-CM | POA: Diagnosis not present

## 2023-03-01 DIAGNOSIS — E876 Hypokalemia: Secondary | ICD-10-CM | POA: Diagnosis present

## 2023-03-01 DIAGNOSIS — D62 Acute posthemorrhagic anemia: Secondary | ICD-10-CM | POA: Diagnosis not present

## 2023-03-01 DIAGNOSIS — Z888 Allergy status to other drugs, medicaments and biological substances status: Secondary | ICD-10-CM

## 2023-03-01 DIAGNOSIS — Z7901 Long term (current) use of anticoagulants: Secondary | ICD-10-CM

## 2023-03-01 DIAGNOSIS — C799 Secondary malignant neoplasm of unspecified site: Secondary | ICD-10-CM

## 2023-03-01 DIAGNOSIS — Z951 Presence of aortocoronary bypass graft: Secondary | ICD-10-CM

## 2023-03-01 DIAGNOSIS — Z90722 Acquired absence of ovaries, bilateral: Secondary | ICD-10-CM

## 2023-03-01 DIAGNOSIS — R54 Age-related physical debility: Secondary | ICD-10-CM | POA: Diagnosis not present

## 2023-03-01 DIAGNOSIS — Z823 Family history of stroke: Secondary | ICD-10-CM

## 2023-03-01 DIAGNOSIS — G893 Neoplasm related pain (acute) (chronic): Secondary | ICD-10-CM | POA: Diagnosis not present

## 2023-03-01 DIAGNOSIS — Z79890 Hormone replacement therapy (postmenopausal): Secondary | ICD-10-CM | POA: Diagnosis not present

## 2023-03-01 DIAGNOSIS — D509 Iron deficiency anemia, unspecified: Secondary | ICD-10-CM | POA: Diagnosis present

## 2023-03-01 DIAGNOSIS — Z79899 Other long term (current) drug therapy: Secondary | ICD-10-CM

## 2023-03-01 DIAGNOSIS — J45909 Unspecified asthma, uncomplicated: Secondary | ICD-10-CM | POA: Diagnosis not present

## 2023-03-01 DIAGNOSIS — I350 Nonrheumatic aortic (valve) stenosis: Secondary | ICD-10-CM | POA: Diagnosis not present

## 2023-03-01 DIAGNOSIS — I872 Venous insufficiency (chronic) (peripheral): Secondary | ICD-10-CM | POA: Diagnosis present

## 2023-03-01 DIAGNOSIS — Z933 Colostomy status: Secondary | ICD-10-CM

## 2023-03-01 DIAGNOSIS — I110 Hypertensive heart disease with heart failure: Secondary | ICD-10-CM | POA: Diagnosis not present

## 2023-03-01 DIAGNOSIS — I517 Cardiomegaly: Secondary | ICD-10-CM | POA: Diagnosis not present

## 2023-03-01 DIAGNOSIS — E039 Hypothyroidism, unspecified: Secondary | ICD-10-CM | POA: Diagnosis present

## 2023-03-01 DIAGNOSIS — Z923 Personal history of irradiation: Secondary | ICD-10-CM

## 2023-03-01 DIAGNOSIS — Z90710 Acquired absence of both cervix and uterus: Secondary | ICD-10-CM

## 2023-03-01 DIAGNOSIS — Z86718 Personal history of other venous thrombosis and embolism: Secondary | ICD-10-CM

## 2023-03-01 DIAGNOSIS — Z7982 Long term (current) use of aspirin: Secondary | ICD-10-CM

## 2023-03-01 DIAGNOSIS — I214 Non-ST elevation (NSTEMI) myocardial infarction: Principal | ICD-10-CM

## 2023-03-01 DIAGNOSIS — R600 Localized edema: Secondary | ICD-10-CM | POA: Diagnosis not present

## 2023-03-01 DIAGNOSIS — I2510 Atherosclerotic heart disease of native coronary artery without angina pectoris: Secondary | ICD-10-CM | POA: Diagnosis present

## 2023-03-01 DIAGNOSIS — Z881 Allergy status to other antibiotic agents status: Secondary | ICD-10-CM

## 2023-03-01 DIAGNOSIS — I878 Other specified disorders of veins: Secondary | ICD-10-CM | POA: Diagnosis present

## 2023-03-01 DIAGNOSIS — C539 Malignant neoplasm of cervix uteri, unspecified: Secondary | ICD-10-CM | POA: Diagnosis not present

## 2023-03-01 DIAGNOSIS — Z01818 Encounter for other preprocedural examination: Secondary | ICD-10-CM | POA: Diagnosis not present

## 2023-03-01 DIAGNOSIS — I480 Paroxysmal atrial fibrillation: Secondary | ICD-10-CM | POA: Diagnosis present

## 2023-03-01 DIAGNOSIS — D500 Iron deficiency anemia secondary to blood loss (chronic): Secondary | ICD-10-CM

## 2023-03-01 DIAGNOSIS — Z1509 Genetic susceptibility to other malignant neoplasm: Secondary | ICD-10-CM

## 2023-03-01 DIAGNOSIS — Z95828 Presence of other vascular implants and grafts: Secondary | ICD-10-CM

## 2023-03-01 DIAGNOSIS — Z8542 Personal history of malignant neoplasm of other parts of uterus: Secondary | ICD-10-CM

## 2023-03-01 DIAGNOSIS — I238 Other current complications following acute myocardial infarction: Secondary | ICD-10-CM | POA: Diagnosis present

## 2023-03-01 DIAGNOSIS — I200 Unstable angina: Secondary | ICD-10-CM | POA: Diagnosis not present

## 2023-03-01 DIAGNOSIS — Z9221 Personal history of antineoplastic chemotherapy: Secondary | ICD-10-CM

## 2023-03-01 DIAGNOSIS — I5031 Acute diastolic (congestive) heart failure: Secondary | ICD-10-CM | POA: Diagnosis not present

## 2023-03-01 DIAGNOSIS — E785 Hyperlipidemia, unspecified: Secondary | ICD-10-CM | POA: Diagnosis present

## 2023-03-01 DIAGNOSIS — Z9049 Acquired absence of other specified parts of digestive tract: Secondary | ICD-10-CM

## 2023-03-01 DIAGNOSIS — D508 Other iron deficiency anemias: Secondary | ICD-10-CM

## 2023-03-01 DIAGNOSIS — Z8249 Family history of ischemic heart disease and other diseases of the circulatory system: Secondary | ICD-10-CM

## 2023-03-01 DIAGNOSIS — Z8673 Personal history of transient ischemic attack (TIA), and cerebral infarction without residual deficits: Secondary | ICD-10-CM

## 2023-03-01 DIAGNOSIS — M7989 Other specified soft tissue disorders: Secondary | ICD-10-CM | POA: Diagnosis not present

## 2023-03-01 LAB — CBC WITH DIFFERENTIAL (CANCER CENTER ONLY)
Abs Immature Granulocytes: 0.05 10*3/uL (ref 0.00–0.07)
Basophils Absolute: 0 10*3/uL (ref 0.0–0.1)
Basophils Relative: 1 %
Eosinophils Absolute: 0.1 10*3/uL (ref 0.0–0.5)
Eosinophils Relative: 1 %
HCT: 29 % — ABNORMAL LOW (ref 36.0–46.0)
Hemoglobin: 9 g/dL — ABNORMAL LOW (ref 12.0–15.0)
Immature Granulocytes: 1 %
Lymphocytes Relative: 17 %
Lymphs Abs: 0.8 10*3/uL (ref 0.7–4.0)
MCH: 26.2 pg (ref 26.0–34.0)
MCHC: 31 g/dL (ref 30.0–36.0)
MCV: 84.3 fL (ref 80.0–100.0)
Monocytes Absolute: 0.3 10*3/uL (ref 0.1–1.0)
Monocytes Relative: 6 %
Neutro Abs: 3.4 10*3/uL (ref 1.7–7.7)
Neutrophils Relative %: 74 %
Platelet Count: 277 10*3/uL (ref 150–400)
RBC: 3.44 MIL/uL — ABNORMAL LOW (ref 3.87–5.11)
RDW: 15.6 % — ABNORMAL HIGH (ref 11.5–15.5)
WBC Count: 4.6 10*3/uL (ref 4.0–10.5)
nRBC: 0 % (ref 0.0–0.2)

## 2023-03-01 LAB — TROPONIN I (HIGH SENSITIVITY)
Troponin I (High Sensitivity): 509 ng/L (ref <18)
Troponin I (High Sensitivity): 519 ng/L (ref <18)

## 2023-03-01 LAB — RETICULOCYTES
Immature Retic Fract: 13 % (ref 2.3–15.9)
RBC.: 3.45 MIL/uL — ABNORMAL LOW (ref 3.87–5.11)
Retic Count, Absolute: 37.3 10*3/uL (ref 19.0–186.0)
Retic Ct Pct: 1.1 % (ref 0.4–3.1)

## 2023-03-01 LAB — CMP (CANCER CENTER ONLY)
ALT: 5 U/L (ref 0–44)
AST: 13 U/L — ABNORMAL LOW (ref 15–41)
Albumin: 3.8 g/dL (ref 3.5–5.0)
Alkaline Phosphatase: 80 U/L (ref 38–126)
Anion gap: 9 (ref 5–15)
BUN: 11 mg/dL (ref 8–23)
CO2: 27 mmol/L (ref 22–32)
Calcium: 8.9 mg/dL (ref 8.9–10.3)
Chloride: 104 mmol/L (ref 98–111)
Creatinine: 0.82 mg/dL (ref 0.44–1.00)
GFR, Estimated: >60 mL/min (ref >60)
Glucose, Bld: 101 mg/dL — ABNORMAL HIGH (ref 70–99)
Potassium: 3.8 mmol/L (ref 3.5–5.1)
Sodium: 140 mmol/L (ref 135–145)
Total Bilirubin: 0.3 mg/dL (ref 0.3–1.2)
Total Protein: 6.8 g/dL (ref 6.5–8.1)

## 2023-03-01 LAB — IRON AND IRON BINDING CAPACITY (CC-WL,HP ONLY)
Iron: 25 ug/dL — ABNORMAL LOW (ref 28–170)
Saturation Ratios: 10 % — ABNORMAL LOW (ref 10.4–31.8)
TIBC: 262 ug/dL (ref 250–450)
UIBC: 237 ug/dL (ref 148–442)

## 2023-03-01 LAB — D-DIMER, QUANTITATIVE: D-Dimer, Quant: 0.78 ug{FEU}/mL — ABNORMAL HIGH (ref 0.00–0.50)

## 2023-03-01 LAB — FERRITIN: Ferritin: 53 ng/mL (ref 11–307)

## 2023-03-01 MED ORDER — HEPARIN (PORCINE) 25000 UT/250ML-% IV SOLN
750.0000 [IU]/h | INTRAVENOUS | Status: DC
  Administered 2023-03-01: 750 [IU]/h via INTRAVENOUS
  Filled 2023-03-01: qty 250

## 2023-03-01 MED ORDER — ASPIRIN 81 MG PO CHEW
81.0000 mg | CHEWABLE_TABLET | Freq: Every day | ORAL | Status: DC
  Administered 2023-03-02 – 2023-03-04 (×3): 81 mg via ORAL
  Filled 2023-03-01 (×3): qty 1

## 2023-03-01 MED ORDER — SODIUM CHLORIDE 0.9 % IV SOLN
100.0000 mg | INTRAVENOUS | Status: AC
  Administered 2023-03-02 – 2023-03-04 (×3): 100 mg via INTRAVENOUS
  Filled 2023-03-01 (×3): qty 5

## 2023-03-01 MED ORDER — DILTIAZEM HCL ER COATED BEADS 300 MG PO CP24
300.0000 mg | ORAL_CAPSULE | Freq: Every day | ORAL | Status: DC
  Administered 2023-03-02 – 2023-03-03 (×3): 300 mg via ORAL
  Filled 2023-03-01 (×4): qty 1

## 2023-03-01 MED ORDER — EZETIMIBE 10 MG PO TABS
10.0000 mg | ORAL_TABLET | Freq: Every day | ORAL | Status: DC
  Administered 2023-03-02 – 2023-03-04 (×3): 10 mg via ORAL
  Filled 2023-03-01 (×3): qty 1

## 2023-03-01 MED ORDER — ACETAMINOPHEN 500 MG PO TABS
1000.0000 mg | ORAL_TABLET | Freq: Four times a day (QID) | ORAL | Status: DC | PRN
  Filled 2023-03-01: qty 2

## 2023-03-01 MED ORDER — SODIUM CHLORIDE 0.9% FLUSH
10.0000 mL | INTRAVENOUS | Status: DC | PRN
  Administered 2023-03-01: 10 mL via INTRAVENOUS

## 2023-03-01 MED ORDER — TRAMADOL HCL 50 MG PO TABS
100.0000 mg | ORAL_TABLET | Freq: Two times a day (BID) | ORAL | Status: DC | PRN
  Administered 2023-03-01 – 2023-03-04 (×4): 100 mg via ORAL
  Filled 2023-03-01 (×4): qty 2

## 2023-03-01 MED ORDER — LEVOTHYROXINE SODIUM 50 MCG PO TABS
50.0000 ug | ORAL_TABLET | Freq: Every day | ORAL | Status: DC
  Administered 2023-03-02 – 2023-03-04 (×3): 50 ug via ORAL
  Filled 2023-03-01 (×3): qty 1

## 2023-03-01 MED ORDER — POLYETHYLENE GLYCOL 3350 17 G PO PACK
17.0000 g | PACK | Freq: Every day | ORAL | Status: DC | PRN
  Administered 2023-03-02: 17 g via ORAL
  Filled 2023-03-01 (×2): qty 1

## 2023-03-01 MED ORDER — HEPARIN SOD (PORK) LOCK FLUSH 100 UNIT/ML IV SOLN
500.0000 [IU] | Freq: Once | INTRAVENOUS | Status: AC
  Administered 2023-03-01: 500 [IU] via INTRAVENOUS

## 2023-03-01 MED ORDER — ASPIRIN 81 MG PO CHEW
324.0000 mg | CHEWABLE_TABLET | Freq: Once | ORAL | Status: AC
  Administered 2023-03-01: 324 mg via ORAL
  Filled 2023-03-01: qty 4

## 2023-03-01 NOTE — Progress Notes (Addendum)
ANTICOAGULATION CONSULT NOTE - Initial Consult  Pharmacy Consult for heparin Indication: chest pain/ACS  Allergies  Allergen Reactions   Statins Other (See Comments)    Myalgias and memory problems   Ciprofloxacin Itching and Other (See Comments)    Splotchy redness with itching during IV infusion localized to arm. Other reaction(s): itching/swelling/redness at injection site    Patient Measurements: Height: 5\' 1"  (154.9 cm) Weight: 70 kg (154 lb 5.2 oz) IBW/kg (Calculated) : 47.8 Heparin Dosing Weight: 62.8 kg  Vital Signs: Temp: 98.3 F (36.8 C) (10/01 1715) Temp Source: Oral (10/01 1715) BP: 170/59 (10/01 1800) Pulse Rate: 79 (10/01 1800)  Labs: Recent Labs    03/01/23 1210 03/01/23 1428  HGB 9.0*  --   HCT 29.0*  --   PLT 277  --   CREATININE 0.82  --   TROPONINIHS  --  509*    Estimated Creatinine Clearance: 48.2 mL/min (by C-G formula based on SCr of 0.82 mg/dL).   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 Last dose of eliquis at 6AM 10/1. Pharmacy consulted to dose heparin.  Hgb 9, plt 277   Goal of Therapy:  Heparin  level 0.3-0.7 units/ml aPTT 66-102 seconds Monitor platelets by anticoagulation protocol: Yes   Plan:  No bolus due to Eliquis dose this morning Start heparin infusion at 750 units/hr Check aPTT in 8 hours until levels correlate Check anti-Xa level in 8 hours and daily while on heparin Continue to monitor CBC and for signs/symptoms of bleeding  Stephenie Acres, PharmD PGY1 Pharmacy Resident 03/01/2023 6:24 PM

## 2023-03-01 NOTE — Progress Notes (Signed)
Hematology and Oncology Follow Up Visit  Shannon Scott [id number] 1941/05/22 82 y.o. 03/01/2023   Principle Diagnosis:  Locally advanced/recurrent endometrial carcinoma undifferentiated --  TMB (HIGH) / MSI HIGH  Intermittent iron deficiency anemia   Past Therapy: Radiation therapy for 5 -5 1/2 weeks - s/p 20 fractions Taxol/carboplatinum q 7 days -- s/p cycle 6    Current Therapy:        Pembrolizumab 400 mg q 12 weeks, s/p cycle 20 (originally started on 01/23/2019 at 400 mg IV q 6 wk)  --on hold for patient request since April 2022 Zometa 4 mg IV q 3 months -- next dose due 01/18/2023 IV iron as indicated --Venofer given on 10/08/2022       Interim History:  Shannon Scott is here today for follow-up.  Unfortunately, I think we are to have to get her down to the ER.  She said that when she walked from her car to the office, she began to have some chest pressure.  Her hemoglobin is 9.  I had worried about the possibility of coronary ischemia.  As such, we will get her down to the ER.  Otherwise, she is managing okay.  She does have a really bad lymphedema.  Hopefully, she does not have any cellulitis on the left leg.  I saw the left leg.  She has areas of erythema.  I do not know if this might be a little bit of stasis dermatitis or possible cellulitis.  She has had no diaphoresis.  There is been no pain radiating in her left arm.  Again she is has a chest pressure.    She  has a colostomy.  She has a large hernia around the colostomy site.  When I saw her back in May, her iron saturation was 10%.  She did get some IV iron.  Currently, I would say that her performance status is probably ECOG 1.   Wt Readings from Last 3 Encounters:  03/01/23 153 lb (69.4 kg)  03/01/23 153 lb (69.4 kg)  10/08/22 152 lb (68.9 kg)   Medications:  Allergies as of 03/01/2023       Reactions   Statins Other (See Comments)   Myalgias and memory problems   Ciprofloxacin Itching, Other (See  Comments)   Splotchy redness with itching during IV infusion localized to arm. Other reaction(s): itching/swelling/redness at injection site        Medication List        Accurate as of March 01, 2023  1:01 PM. If you have any questions, ask your nurse or doctor.          apixaban 5 MG Tabs tablet Commonly known as: ELIQUIS Take 1 tablet (5 mg total) by mouth 2 (two) times daily.   clobetasol ointment 0.05 % Commonly known as: TEMOVATE Apply 1 Application topically 2 (two) times a week.   diltiazem 300 MG 24 hr capsule Commonly known as: TIAZAC TAKE 1 CAPSULE(300 MG) BY MOUTH DAILY What changed: See the new instructions.   ezetimibe 10 MG tablet Commonly known as: ZETIA Take 10 mg by mouth daily.   levothyroxine 50 MCG tablet Commonly known as: SYNTHROID Take 1 tablet (50 mcg total) by mouth daily before breakfast.   mometasone 0.1 % cream Commonly known as: ELOCON Apply 1 Application topically daily as needed (to the vulva for irritation and itching).   nystatin powder Commonly known as: MYCOSTATIN/NYSTOP Apply 1 Application topically 3 (three) times daily.   polyethylene glycol 17  g packet Commonly known as: MIRALAX / GLYCOLAX Take 17 g by mouth daily as needed for severe constipation. What changed: when to take this   Repatha SureClick 140 MG/ML Soaj Generic drug: Evolocumab INJECT 1 DOSE UNDER THE SKIN EVERY 14 DAYS   traMADol 50 MG tablet Commonly known as: ULTRAM TAKE 2 TABLETS(100 MG) BY MOUTH TWICE DAILY        Allergies:  Allergies  Allergen Reactions   Statins Other (See Comments)    Myalgias and memory problems   Ciprofloxacin Itching and Other (See Comments)    Splotchy redness with itching during IV infusion localized to arm. Other reaction(s): itching/swelling/redness at injection site    Past Medical History, Surgical history, Social history, and Family History were reviewed and updated.  Review of Systems: Review of Systems   Constitutional: Negative.   HENT: Negative.    Eyes: Negative.   Respiratory: Negative.    Cardiovascular:  Positive for leg swelling.  Gastrointestinal: Negative.   Genitourinary: Negative.   Musculoskeletal:  Positive for back pain.  Skin: Negative.   Neurological: Negative.   Endo/Heme/Allergies: Negative.   Psychiatric/Behavioral: Negative.       Physical Exam:  height is 5\' 1"  (1.549 m) and weight is 153 lb (69.4 kg). Her oral temperature is 98 F (36.7 C). Her blood pressure is 157/47 (abnormal) and her pulse is 82. Her respiration is 19 and oxygen saturation is 98%.   Wt Readings from Last 3 Encounters:  03/01/23 153 lb (69.4 kg)  03/01/23 153 lb (69.4 kg)  10/08/22 152 lb (68.9 kg)    Physical Exam Vitals reviewed.  HENT:     Head: Normocephalic and atraumatic.  Eyes:     Pupils: Pupils are equal, round, and reactive to light.  Cardiovascular:     Rate and Rhythm: Normal rate and regular rhythm.     Heart sounds: Normal heart sounds.     Comments: Cardiac exam shows a regular rate and rhythm.  She has a 2/6 systolic ejection murmur.   Pulmonary:     Effort: Pulmonary effort is normal.     Breath sounds: Normal breath sounds.  Abdominal:     General: Bowel sounds are normal.     Palpations: Abdomen is soft.  Musculoskeletal:        General: No tenderness or deformity. Normal range of motion.     Cervical back: Normal range of motion.     Comments: She has significant lymphedema in her legs.  She has probably 3+ in the left leg and 1+ in the right leg. She has pitting edema.  She has decent range of motion of her joints.  Lymphadenopathy:     Cervical: No cervical adenopathy.  Skin:    General: Skin is warm and dry.     Findings: No erythema or rash.  Neurological:     Mental Status: She is alert and oriented to person, place, and time.  Psychiatric:        Behavior: Behavior normal.        Thought Content: Thought content normal.        Judgment:  Judgment normal.     Lab Results  Component Value Date   WBC 4.6 03/01/2023   HGB 9.0 (L) 03/01/2023   HCT 29.0 (L) 03/01/2023   MCV 84.3 03/01/2023   PLT 277 03/01/2023   Lab Results  Component Value Date   FERRITIN 128 10/08/2022   IRON 23 (L) 10/08/2022   TIBC 230 (L)  10/08/2022   UIBC 207 10/08/2022   IRONPCTSAT 10 (L) 10/08/2022   Lab Results  Component Value Date   RETICCTPCT 1.1 03/01/2023   RBC 3.44 (L) 03/01/2023   RBC 3.45 (L) 03/01/2023   No results found for: "KPAFRELGTCHN", "LAMBDASER", "KAPLAMBRATIO" No results found for: "IGGSERUM", "IGA", "IGMSERUM" No results found for: "TOTALPROTELP", "ALBUMINELP", "A1GS", "A2GS", "BETS", "BETA2SER", "GAMS", "MSPIKE", "SPEI"   Chemistry      Component Value Date/Time   NA 140 03/01/2023 1210   NA 136 (A) 09/08/2017 0000   K 3.8 03/01/2023 1210   CL 104 03/01/2023 1210   CO2 27 03/01/2023 1210   BUN 11 03/01/2023 1210   BUN 8 09/08/2017 0000   CREATININE 0.82 03/01/2023 1210   CREATININE 0.69 08/02/2016 1519   GLU 111 09/08/2017 0000      Component Value Date/Time   CALCIUM 8.9 03/01/2023 1210   ALKPHOS 80 03/01/2023 1210   AST 13 (L) 03/01/2023 1210   ALT 5 03/01/2023 1210   BILITOT 0.3 03/01/2023 1210       Impression and Plan: Ms. Belair is a very pleasant 82 yo caucasian female with recurrent undifferentiated endometrial carcinoma.  She has the Lynch syndrome.  She was on immunotherapy.  She tolerated this incredibly well.  It worked incredibly well.  Again, have to worry about the possibility of coronary artery disease and ischemia.  Again she has a chest pressure when she walks.  I told her that women typically have different signs of coronary ischemia then men do.  As such, I really think we have to have her evaluated liver more thoroughly.  She is anemic.  I think she bodies go to need iron.  However, we really need to see what is going on with respect to her heart.  Called down to the ER.  They  are very gracious and seeing her down there and try to help [place] out.  Hopefully, if she is admitted, I will be able to see her in the hospital.  Josph Macho, MD 10/1/20241:01 PM

## 2023-03-01 NOTE — ED Notes (Signed)
Imaging at bedside.

## 2023-03-01 NOTE — ED Notes (Signed)
ED TO INPATIENT HANDOFF REPORT  ED Nurse Name and Phone #: Amelya Mabry, RN, PCCN  S Name/Age/Gender Shannon Scott 82 y.o. female Room/Bed: MH01/MH01  Code Status   Code Status: Prior  Home/SNF/Other Home Patient oriented to: self, place, time, and situation Is this baseline? Yes   Triage Complete: Triage complete  Chief Complaint NSTEMI (non-ST elevated myocardial infarction) [facility]) [I21.4]  Triage Note Was seen at Dr Myna Hidalgo office , expressed chest pain in office , sent her for CAd work up . Pt denies chest pain or shortness of breath during triage . Reports worsening to her right leg edema , been worse x 2 weeks     Allergies Allergies  Allergen Reactions   Statins Other (See Comments)    Myalgias and memory problems   Ciprofloxacin Itching and Other (See Comments)    Splotchy redness with itching during IV infusion localized to arm. Other reaction(s): itching/swelling/redness at injection site    Level of Care/Admitting Diagnosis ED Disposition     ED Disposition  Admit   Condition  --   Comment  Hospital Area: [facility] [[id number]]  Level of Care: Progressive [102]  Admit to Progressive based on following criteria: CARDIOVASCULAR & THORACIC of moderate stability with acute coronary syndrome symptoms/low risk myocardial infarction/hypertensive urgency/arrhythmias/heart failure potentially compromising stability and stable post cardiovascular intervention patients.  Interfacility transfer: Yes  May place patient in observation at [facility] or [facility] if equivalent level of care is available:: No  Covid Evaluation: Asymptomatic - no recent exposure (last 10 days) testing not required  Diagnosis: NSTEMI (non-ST elevated myocardial infarction) [facility]) [[id number]]  Admitting Physician: Synetta Fail [[id number]]  Attending Physician: Synetta Fail [[id number]]          B Medical/Surgery History Past Medical History:  Diagnosis  Date   A-fib (HCC) 03/26/2020   Acute CVA (cerebrovascular accident) (HCC) 03/26/2020   Arthritis    Asthma    allergy induced   Bilateral carotid artery disease (HCC)    L carotid bruit   Bursitis    left hip   CAD (coronary artery disease)    Cancer (HCC)    Dyslipidemia    intolerant to statins, welchol, niacin, zetia   Goals of care, counseling/discussion 10/11/2017   History of blood transfusion    History of nuclear stress test 04/24/2012   lexiscan; normal study   Hypertension    Hypothyroidism    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 (HCC) 10/11/2017   Postoperative nausea and vomiting 01/02/2016   Past Surgical History:  Procedure Laterality Date   ABDOMINAL HYSTERECTOMY     Carotid Doppler  02/2012   40-59% right int carotid artery stenosis; 60-79% L int carotid stenosis; L carotid bruit   CORONARY ARTERY BYPASS GRAFT  03/12/2004   LIMA to LAD, SVG to circumflex, SVG to PDA   CYSTOSCOPY W/ URETERAL STENT PLACEMENT Left 12/06/2017   Procedure: CYSTOSCOPY WITH LEFT URETERAL STENT EXCHANGE;  Surgeon: Bjorn Pippin, MD;  Location: [facility];  Service: Urology;  Laterality: Left;   CYSTOSCOPY WITH STENT PLACEMENT Bilateral 08/21/2017   Procedure: CYSTOSCOPY WITH STENT PLACEMENT;  Surgeon: Rene Paci, MD;  Location: [facility];  Service: Urology;  Laterality: Bilateral;   IR FLUORO GUIDE PORT INSERTION RIGHT  10/11/2017   IR GENERIC HISTORICAL  04/29/2016   IR RADIOLOGIST EVAL & MGMT 04/29/2016 Simonne Come, MD [facility] INTERV RAD   IR GENERIC HISTORICAL  05/12/2016  IR RADIOLOGIST EVAL & MGMT 05/12/2016 Simonne Come, MD [facility] INTERV RAD   IR IVC FILTER PLMT / S&I Lenise Arena GUID/MOD SED  10/11/2017   IR US GUIDE VASC ACCESS RIGHT  10/11/2017   ROBOTIC ASSISTED TOTAL HYSTERECTOMY WITH BILATERAL SALPINGO OOPHERECTOMY Bilateral 12/30/2015   Procedure: XI ROBOTIC ASSISTED TOTAL HYSTERECTOMY WITH BILATERAL SALPINGO OOPHORECTOMY WITH SENTAL LYMPH  NODE BIOPSY;  Surgeon: Cleda Mccreedy, MD;  Location: [facility];  Service: Gynecology;  Laterality: Bilateral;   TONSILLECTOMY     TRANSTHORACIC ECHOCARDIOGRAM  04/2007   EF>55%; mild MR; mild-mod TR; mild pulm HTN; mild calcification of aortiv valve leaflets with mild valvular aortic stenosis   TUBAL LIGATION       A IV Location/Drains/Wounds Patient Lines/Drains/Airways Status     Active Line/Drains/Airways     Name Placement date Placement time Site Days   Implanted Port 10/11/17 Right Chest 10/11/17  1235  Chest  1967   Peripheral IV 03/01/23 20 G Right Antecubital 03/01/23  1434  Antecubital  less than 1   Colostomy LUQ 08/21/17  0949  LUQ  2018   Ureteral Drain/Stent Left ureter 6 Fr. 12/06/17  0756  Left ureter  1911   Incision (Closed) 08/21/17 Abdomen Other (Comment) 08/21/17  0950  -- 2018   Incision - 3 Ports Abdomen 1: Right;Lateral 2: Right;Medial 3: Lower;Mid 08/21/17  0930  -- 2018            Intake/Output Last 24 hours  Intake/Output Summary (Last 24 hours) at 03/01/2023 2109 Last data filed at 03/01/2023 1819 Gross per 24 hour  Intake --  Output 1500 ml  Net -1500 ml    Labs/Imaging Results for orders placed or performed during the hospital encounter of 03/01/23 (from the past 48 hour(s))  Troponin I (High Sensitivity)     Status: Abnormal   Collection Time: 03/01/23  2:28 PM  Result Value Ref Range   Troponin I (High Sensitivity) 509 (HH) <18 ng/L    Comment: CRITICAL RESULT CALLED TO, READ BACK BY AND VERIFIED WITH: SOPHIE GOUGE RN,03/01/23 @1705  BY JYANG (NOTE) Elevated high sensitivity troponin I (hsTnI) values and significant  changes across serial measurements may suggest ACS but many other  chronic and acute conditions are known to elevate hsTnI results.  Refer to the Links section for chest pain algorithms and additional  guidance. Performed at [facility], [address] [place], [place] [address]   D-dimer, quantitative      Status: Abnormal   Collection Time: 03/01/23  2:28 PM  Result Value Ref Range   D-Dimer, Quant 0.78 (H) 0.00 - 0.50 ug/mL-FEU    Comment: (NOTE) At the manufacturer cut-off value of 0.5 g/mL FEU, this assay has a negative predictive value of 95-100%.This assay is intended for use in conjunction with a clinical pretest probability (PTP) assessment model to exclude pulmonary embolism (PE) and deep venous thrombosis (DVT) in outpatients suspected of PE or DVT. Results should be correlated with clinical presentation. Performed at [facility], [address] [place], [place] [address]   Troponin I (High Sensitivity)     Status: Abnormal   Collection Time: 03/01/23  5:03 PM  Result Value Ref Range   Troponin I (High Sensitivity) 519 (HH) <18 ng/L    Comment: CRITICAL VALUE NOTED.  VALUE IS CONSISTENT WITH PREVIOUSLY REPORTED AND CALLED VALUE. (NOTE) Elevated high sensitivity troponin I (hsTnI) values and significant  changes across serial measurements may suggest ACS but many other  chronic  and acute conditions are known to elevate hsTnI results.  Refer to the Links section for chest pain algorithms and additional  guidance. Performed at [facility], [address] [place], [place] [address]    DG Chest Concepcion 1 View  Result Date: 03/01/2023 CLINICAL DATA:  Right lower extremity edema. EXAM: PORTABLE CHEST 1 VIEW COMPARISON:  Chest radiograph dated March 01, 2022. FINDINGS: The heart size and mediastinal contours are within normal limits. Stable right internal jugular Port-A-Cath. Postsurgical changes related to prior CABG. No focal consolidation, pneumothorax, or sizable pleural effusion. No acute osseous abnormality. IMPRESSION: No active cardiopulmonary disease. Electronically Signed   By: Hart Robinsons M.D.   On: 03/01/2023 16:13   US Venous Img Lower  Left (DVT Study)  Result Date: 03/01/2023 CLINICAL DATA:  82 year old female with edema and  swelling EXAM: LEFT LOWER EXTREMITY VENOUS DOPPLER ULTRASOUND TECHNIQUE: Gray-scale sonography with graded compression, as well as color Doppler and duplex ultrasound were performed to evaluate the lower extremity deep venous systems from the level of the common femoral vein and including the common femoral, femoral, profunda femoral, popliteal and calf veins including the posterior tibial, peroneal and gastrocnemius veins when visible. The superficial great saphenous vein was also interrogated. Spectral Doppler was utilized to evaluate flow at rest and with distal augmentation maneuvers in the common femoral, femoral and popliteal veins. COMPARISON:  None Available. FINDINGS: Contralateral Common Femoral Vein: Respiratory phasicity is normal and symmetric with the symptomatic side. No evidence of thrombus. Normal compressibility. Common Femoral Vein: No evidence of thrombus. Normal compressibility, respiratory phasicity and response to augmentation. Saphenofemoral Junction: No evidence of thrombus. Normal compressibility and flow on color Doppler imaging. Profunda Femoral Vein: No evidence of thrombus. Normal compressibility and flow on color Doppler imaging. Femoral Vein: No evidence of thrombus. Normal compressibility, respiratory phasicity and response to augmentation. Popliteal Vein: No evidence of thrombus. Normal compressibility, respiratory phasicity and response to augmentation. Calf Veins: Calf veins not visualized Superficial Great Saphenous Vein: No evidence of thrombus. Normal compressibility and flow on color Doppler imaging. Other Findings:  Edema IMPRESSION: Directed duplex of the left lower extremity negative for DVT Signed, Yvone Neu. Miachel Roux, RPVI [facility] Electronically Signed   By: Gilmer Mor D.O.   On: 03/01/2023 15:54    Pending Labs Unresulted Labs (From admission, onward)     Start     Ordered   03/03/23 0500   Heparin level (unfractionated)  Daily,   R      03/01/23 1845   03/03/23 0500  APTT  Daily,   R      03/01/23 1845   03/02/23 0300  Heparin level (unfractionated)  Once-Timed,   URGENT        03/01/23 1845   03/02/23 0300  APTT  Once-Timed,   STAT        03/01/23 1845            Vitals/Pain Today's Vitals   03/01/23 1810 03/01/23 1815 03/01/23 1830 03/01/23 1930  BP: (!) 178/66 (!) 169/59 (!) 150/63 (!) 181/64  Pulse: 80 79  85  Resp: 14 (!) 22 (!) 22 17  Temp:      TempSrc:      SpO2: 96% 100%  99%  Weight:      Height:      PainSc:        Isolation Precautions No active isolations  Medications Medications  heparin ADULT infusion 100 units/mL (25000  units/212mL) (750 Units/hr Intravenous New Bag/Given 03/01/23 1857)  aspirin chewable tablet 324 mg (324 mg Oral Given 03/01/23 1714)    Mobility walks with person assist     Focused Assessments Cardiac Assessment Handoff:  Cardiac Rhythm: Normal sinus rhythm Lab Results  Component Value Date   TROPONINI 0.28 (HH) 08/20/2017   Lab Results  Component Value Date   DDIMER 0.78 (H) 03/01/2023   Does the Patient currently have chest pain? No    R Recommendations: See Admitting Provider Note  Report given to:   Additional Notes:

## 2023-03-01 NOTE — ED Notes (Signed)
[facility] called for transport @ 20:37 No Current ETA.. ED Nurse will call floor for report

## 2023-03-01 NOTE — ED Provider Notes (Signed)
[place] [facility] AT [facility] Provider Note   CSN: [id number] Arrival date & time: 03/01/23  1329     History  Chief Complaint  Patient presents with   Chest Pain    Shannon Scott is a 82 y.o. female. With a history of CABG x 3, lymphedema, hypertension, asthma, dyslipidemia, malignant neoplasm of uterus presenting for evaluation of exertional chest pressure. She noticed this while walking to her oncology appointment today. She is unable to describe the sensation but states she just had a weird feeling in her chest. No radiation, nausea, vomiting, diaphoresis. She had a similar episode last week. Sensation resolved with rest. No shortness of breath, cough, fever, chills, urinary symptoms, abdominal pain. Currently asymptomatic.   Chest Pain      Home Medications Prior to Admission medications   Medication Sig Start Date End Date Taking? Authorizing Provider  apixaban (ELIQUIS) 5 MG TABS tablet Take 1 tablet (5 mg total) by mouth 2 (two) times daily. 03/29/20   Amin, Ankit C, MD  clobetasol ointment (TEMOVATE) 0.05 % Apply 1 Application topically 2 (two) times a week. 06/24/22   Antionette Char, MD  diltiazem (TIAZAC) 300 MG 24 hr capsule TAKE 1 CAPSULE(300 MG) BY MOUTH DAILY Patient taking differently: Take 300 mg by mouth at bedtime. 06/13/18   Josph Macho, MD  ezetimibe (ZETIA) 10 MG tablet Take 10 mg by mouth daily. 06/05/21   [provider]  levothyroxine (SYNTHROID, LEVOTHROID) 50 MCG tablet Take 1 tablet (50 mcg total) by mouth daily before breakfast. 10/25/17   Tanner, Kathrin Greathouse., PA-C  mometasone (ELOCON) 0.1 % cream Apply 1 Application topically daily as needed (to the vulva for irritation and itching). 05/18/22   Warner Mccreedy D, NP  nystatin (MYCOSTATIN/NYSTOP) powder Apply 1 Application topically 3 (three) times daily. 09/03/22   Carver Fila, MD  polyethylene glycol (MIRALAX / GLYCOLAX) 17 g packet Take 17 g by mouth daily  as needed for severe constipation. Patient taking differently: Take 17 g by mouth daily. 03/29/20   Miguel Rota, MD  REPATHA SURECLICK 140 MG/ML SOAJ INJECT 1 DOSE UNDER THE SKIN EVERY 14 DAYS 06/21/22   Hilty, Lisette Abu, MD  traMADol (ULTRAM) 50 MG tablet TAKE 2 TABLETS(100 MG) BY MOUTH TWICE DAILY 02/04/23   Josph Macho, MD      Allergies    Statins and Ciprofloxacin    Review of Systems   Review of Systems  Cardiovascular:  Positive for chest pain and leg swelling.  All other systems reviewed and are negative.   Physical Exam Updated Vital Signs BP (!) 159/69 (BP Location: Left Arm)   Pulse 81   Temp 98.3 F (36.8 C) (Oral)   Resp (!) 25   Ht 5\' 1"  (1.549 m)   Wt 70 kg   SpO2 100%   BMI 29.16 kg/m  Physical Exam Vitals and nursing note reviewed.  Constitutional:      General: She is not in acute distress.    Appearance: She is well-developed. She is not ill-appearing, toxic-appearing or diaphoretic.     Comments: Resting comfortably in bed  HENT:     Head: Normocephalic and atraumatic.  Eyes:     Conjunctiva/sclera: Conjunctivae normal.  Cardiovascular:     Rate and Rhythm: Normal rate and regular rhythm.     Pulses:          Radial pulses are 2+ on the right side and 2+ on the left side.  Heart sounds: No murmur heard.    Comments: Systolic murmur Pulmonary:     Effort: Pulmonary effort is normal. No respiratory distress.     Breath sounds: Normal breath sounds. No decreased breath sounds, wheezing, rhonchi or rales.  Abdominal:     Palpations: Abdomen is soft.     Tenderness: There is no abdominal tenderness.  Musculoskeletal:        General: No swelling.     Cervical back: Neck supple.     Right lower leg: Edema present.     Left lower leg: Edema present.     Comments: Bilateral LE edema, left worse than right  Skin:    General: Skin is warm and dry.     Capillary Refill: Capillary refill takes less than 2 seconds.     Comments: Mild erythema of  left lower leg  Neurological:     General: No focal deficit present.     Mental Status: She is alert and oriented to person, place, and time.  Psychiatric:        Mood and Affect: Mood normal.     ED Results / Procedures / Treatments   Labs (all labs ordered are listed, but only abnormal results are displayed) Labs Reviewed  D-DIMER, QUANTITATIVE - Abnormal; Notable for the following components:      Result Value   D-Dimer, Quant 0.78 (*)    All other components within normal limits  TROPONIN I (HIGH SENSITIVITY) - Abnormal; Notable for the following components:   Troponin I (High Sensitivity) 509 (*)    All other components within normal limits  TROPONIN I (HIGH SENSITIVITY)    EKG EKG Interpretation Date/Time:  Tuesday March 01 2023 13:52:01 EDT Ventricular Rate:  74 PR Interval:  52 QRS Duration:  106 QT Interval:  428 QTC Calculation: 475 R Axis:   56  Text Interpretation: Sinus rhythm Short PR interval Borderline repolarization abnormality Artifact in lead(s) I III aVR aVL aVF Confirmed by Alvira Monday ([id number]) on 03/01/2023 5:07:42 PM  Radiology DG Chest Port 1 View  Result Date: 03/01/2023 CLINICAL DATA:  Right lower extremity edema. EXAM: PORTABLE CHEST 1 VIEW COMPARISON:  Chest radiograph dated March 01, 2022. FINDINGS: The heart size and mediastinal contours are within normal limits. Stable right internal jugular Port-A-Cath. Postsurgical changes related to prior CABG. No focal consolidation, pneumothorax, or sizable pleural effusion. No acute osseous abnormality. IMPRESSION: No active cardiopulmonary disease. Electronically Signed   By: Hart Robinsons M.D.   On: 03/01/2023 16:13   US Venous Img Lower  Left (DVT Study)  Result Date: 03/01/2023 CLINICAL DATA:  82 year old female with edema and swelling EXAM: LEFT LOWER EXTREMITY VENOUS DOPPLER ULTRASOUND TECHNIQUE: Gray-scale sonography with graded compression, as well as color Doppler and duplex ultrasound  were performed to evaluate the lower extremity deep venous systems from the level of the common femoral vein and including the common femoral, femoral, profunda femoral, popliteal and calf veins including the posterior tibial, peroneal and gastrocnemius veins when visible. The superficial great saphenous vein was also interrogated. Spectral Doppler was utilized to evaluate flow at rest and with distal augmentation maneuvers in the common femoral, femoral and popliteal veins. COMPARISON:  None Available. FINDINGS: Contralateral Common Femoral Vein: Respiratory phasicity is normal and symmetric with the symptomatic side. No evidence of thrombus. Normal compressibility. Common Femoral Vein: No evidence of thrombus. Normal compressibility, respiratory phasicity and response to augmentation. Saphenofemoral Junction: No evidence of thrombus. Normal compressibility and flow on color Doppler imaging. Profunda  Femoral Vein: No evidence of thrombus. Normal compressibility and flow on color Doppler imaging. Femoral Vein: No evidence of thrombus. Normal compressibility, respiratory phasicity and response to augmentation. Popliteal Vein: No evidence of thrombus. Normal compressibility, respiratory phasicity and response to augmentation. Calf Veins: Calf veins not visualized Superficial Great Saphenous Vein: No evidence of thrombus. Normal compressibility and flow on color Doppler imaging. Other Findings:  Edema IMPRESSION: Directed duplex of the left lower extremity negative for DVT Signed, Yvone Neu. Miachel Roux, RPVI [facility] Radiology Electronically Signed   By: Gilmer Mor D.O.   On: 03/01/2023 15:54    Procedures .Critical Care  Performed by: Michelle Piper, PA-C Authorized by: Michelle Piper, PA-C   Critical care provider statement:    Critical care time (minutes):  55   Critical care was necessary to treat or prevent imminent or life-threatening  deterioration of the following conditions:  Cardiac failure   Critical care was time spent personally by me on the following activities:  Development of treatment plan with patient or surrogate, discussions with consultants, evaluation of patient's response to treatment, examination of patient, ordering and review of laboratory studies, ordering and review of radiographic studies, ordering and performing treatments and interventions, pulse oximetry, re-evaluation of patient's condition and review of old charts   Care discussed with: admitting provider   Comments:     NSTEMI requiring heparin drip     Medications Ordered in ED Medications  aspirin chewable tablet 324 mg (324 mg Oral Given 03/01/23 1714)    ED Course/ Medical Decision Making/ A&P Clinical Course as of 03/01/23 1801  Tue Mar 01, 2023  1502 D-dimer, quantitative(!) Negative age adjusted D dimer [AS]  1717 Troponin I (High Sensitivity)(!!) Baseline 150 [AS]  1729 Spoke to cardiology Dr. Mayford Knife.  She recommends heparin drip, admit to hospitalist service [AS]  [phone] Spoke with hospitalist Dr. Alinda Money who will admit [AS]    Clinical Course User Index [AS] Lalisa Kiehn, Edsel Petrin, PA-C                                 Medical Decision Making Amount and/or Complexity of Data Reviewed Labs: ordered. Decision-making details documented in ED Course. Radiology: ordered.  Risk OTC drugs. Decision regarding hospitalization.   This patient presents to the ED for concern of chest pain, this involves an extensive number of treatment options, and is a complaint that carries with it a high risk of complications and morbidity.  The emergent differential diagnosis of chest pain includes: Acute coronary syndrome, pericarditis, aortic dissection, pulmonary embolism, tension pneumothorax, and esophageal rupture.  other urgent/non-acute considerations include, but are not limited to: chronic angina, aortic stenosis, cardiomyopathy, myocarditis,  mitral valve prolapse, pulmonary hypertension, hypertrophic obstructive cardiomyopathy (HOCM), aortic insufficiency, right ventricular hypertrophy, pneumonia, pleuritis, bronchitis, pneumothorax, tumor, gastroesophageal reflux disease (GERD), esophageal spasm, Mallory-Weiss syndrome, peptic ulcer disease, biliary disease, pancreatitis, functional gastrointestinal pain, cervical or thoracic disk disease or arthritis, shoulder arthritis, costochondritis, subacromial bursitis, anxiety or panic attack, herpes zoster, breast disorders, chest wall tumors, thoracic outlet syndrome, mediastinitis.   My initial workup includes ACS rule out, D dimer, DVT study  Additional history obtained from: Nursing notes from this visit. Previous records within EMR system office visit with Dr. Myna Hidalgo just prior to arrival Family daughter at bedside provides a portion of the history  I ordered, reviewed and interpreted labs which include: CBC, CMP, troponin,  D dimer.  Initial troponin elevated at 5.9, baseline of approximately 150.  Age-adjusted D-dimer negative.  I ordered imaging studies including LLE DVT, chest x-ray I independently visualized and interpreted imaging which showed negative DVT study, negative chest x-ray I agree with the radiologist interpretation  Consultations Obtained:  I requested consultation with the cardiologist Dr. Mayford Knife,  and discussed lab and imaging findings as well as pertinent plan - they recommend: Heparin drip, admitted to hospitalist  Consulted hospitalist Dr. Alinda Money. He will admit patient.  Afebrile, hemodynamically stable.  82 year old female presenting for evaluation of a substernal chest tightness sensation.  This occurred while she was walking and to her oncology visit today.  She had to sit down and has resolved.  She has not had any symptoms since then but did have a similar episode last cough or shortness of breath.  She does have a history of triple bypass.  She reports  compliance with her Eliquis.  She was sent to the emergency department by her oncologist for further evaluation.  On exam, there is a significant systolic murmur appreciated.  This does not appear to be new per chart review.  CBC CMP ordered by her oncologist prior to arrival.  This does show a worsening anemia with a hemoglobin of 9 from baseline of approximately 10.  Electrolytes kidney function, liver function reassuring. Initial troponin elevated to 509 from a baseline of 150. Cardiology was consulted and recommended heparin drip and admission to [facility]. Patient is in agreement with plan. Stable at admission.  Note: Portions of this report may have been transcribed using voice recognition software. Every effort was made to ensure accuracy; however, inadvertent computerized transcription errors may still be present.        Final Clinical Impression(s) / ED Diagnoses Final diagnoses:  NSTEMI (non-ST elevated myocardial infarction) [facility])  Unstable angina [facility])    Rx / DC Orders ED Discharge Orders     None         Michelle Piper, PA-C 03/01/23 1801    Virgina Norfolk, DO 03/02/23 [phone]

## 2023-03-01 NOTE — H&P (Signed)
History and Physical    KATINA WOO AOZ:[id number] DOB: 1941/04/28 DOA: 03/01/2023  PCP: [name], MD   Patient coming from: Clinic - Oncology    Chief Complaint:  Chief Complaint  Patient presents with   Chest Pain    HPI:  Shannon Scott is a 82 y.o. female with hx of CABG x 3 (LIMA-LAD, SVG, OM, SVG, PD) in '05, moderate aortic stenosis, locally advanced endometrial cancer status post chemoradiation, immunotherapy, hysterectomy BSO, colonic mass with partial colectomy and colostomy, prior DVT, A-fib on anticoagulation, hypertension, hyperlipidemia, statin intolerance, lymphedema, asthma, hypothyroidism, who was brought in to the med [place] emergency department from oncology clinic after an episode of chest pain.  She was transferred from bedside at [facility] for non-STEMI.  Reports was in normal state of health, but on further review reports intermittent episodes of left jaw pain without exertion this week.  Dyspneic on exertion about 3 days ago.  And then today while walking from the car to the oncologist office she had to stop halfway in the parking lot due to inability to catch her breath and chest tightness.  No episodes of chest tightness since this time.  Episode was limited resolved with few minutes of rest.  No associated diaphoresis, nausea, vomiting.  Last cath was prior to her CABG in 05.    Review of Systems:  ROS complete and negative except as marked above   Allergies  Allergen Reactions   Statins Other (See Comments)    Myalgias and memory problems   Ciprofloxacin Itching and Other (See Comments)    Splotchy redness with itching during IV infusion localized to arm. Other reaction(s): itching/swelling/redness at injection site    Prior to Admission medications   Medication Sig Start Date End Date Taking? Authorizing Provider  apixaban (ELIQUIS) 5 MG TABS tablet Take 1 tablet (5 mg total) by mouth 2 (two) times daily. 03/29/20   Amin,  Ankit C, MD  clobetasol ointment (TEMOVATE) 0.05 % Apply 1 Application topically 2 (two) times a week. 06/24/22   Antionette Char, MD  diltiazem (TIAZAC) 300 MG 24 hr capsule TAKE 1 CAPSULE(300 MG) BY MOUTH DAILY Patient taking differently: Take 300 mg by mouth at bedtime. 06/13/18   Josph Macho, MD  ezetimibe (ZETIA) 10 MG tablet Take 10 mg by mouth daily. 06/05/21   [provider]  levothyroxine (SYNTHROID, LEVOTHROID) 50 MCG tablet Take 1 tablet (50 mcg total) by mouth daily before breakfast. 10/25/17   Tanner, Kathrin Greathouse., PA-C  mometasone (ELOCON) 0.1 % cream Apply 1 Application topically daily as needed (to the vulva for irritation and itching). 05/18/22   Warner Mccreedy D, NP  nystatin (MYCOSTATIN/NYSTOP) powder Apply 1 Application topically 3 (three) times daily. 09/03/22   Carver Fila, MD  polyethylene glycol (MIRALAX / GLYCOLAX) 17 g packet Take 17 g by mouth daily as needed for severe constipation. Patient taking differently: Take 17 g by mouth daily. 03/29/20   Miguel Rota, MD  REPATHA SURECLICK 140 MG/ML SOAJ INJECT 1 DOSE UNDER THE SKIN EVERY 14 DAYS 06/21/22   Hilty, Lisette Abu, MD  traMADol (ULTRAM) 50 MG tablet TAKE 2 TABLETS(100 MG) BY MOUTH TWICE DAILY 02/04/23   Josph Macho, MD    Past Medical History:  Diagnosis Date   A-fib [facility]) 03/26/2020   Acute CVA (cerebrovascular accident) (HCC) 03/26/2020   Arthritis    Asthma    allergy induced   Bilateral carotid artery disease (HCC)  L carotid bruit   Bursitis    left hip   CAD (coronary artery disease)    Cancer (HCC)    Dyslipidemia    intolerant to statins, welchol, niacin, zetia   Goals of care, counseling/discussion 10/11/2017   History of blood transfusion    History of nuclear stress test 04/24/2012   lexiscan; normal study   Hypertension    Hypothyroidism    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 (HCC) 10/11/2017   Postoperative nausea and vomiting  01/02/2016    Past Surgical History:  Procedure Laterality Date   ABDOMINAL HYSTERECTOMY     Carotid Doppler  02/2012   40-59% right int carotid artery stenosis; 60-79% L int carotid stenosis; L carotid bruit   CORONARY ARTERY BYPASS GRAFT  03/12/2004   LIMA to LAD, SVG to circumflex, SVG to PDA   CYSTOSCOPY W/ URETERAL STENT PLACEMENT Left 12/06/2017   Procedure: CYSTOSCOPY WITH LEFT URETERAL STENT EXCHANGE;  Surgeon: Bjorn Pippin, MD;  Location: [facility];  Service: Urology;  Laterality: Left;   CYSTOSCOPY WITH STENT PLACEMENT Bilateral 08/21/2017   Procedure: CYSTOSCOPY WITH STENT PLACEMENT;  Surgeon: Rene Paci, MD;  Location: [facility];  Service: Urology;  Laterality: Bilateral;   IR FLUORO GUIDE PORT INSERTION RIGHT  10/11/2017   IR GENERIC HISTORICAL  04/29/2016   IR RADIOLOGIST EVAL & MGMT 04/29/2016 Simonne Come, MD [facility] INTERV RAD   IR GENERIC HISTORICAL  05/12/2016   IR RADIOLOGIST EVAL & MGMT 05/12/2016 Simonne Come, MD [facility] INTERV RAD   IR IVC FILTER PLMT / S&I Lenise Arena GUID/MOD SED  10/11/2017   IR US GUIDE VASC ACCESS RIGHT  10/11/2017   ROBOTIC ASSISTED TOTAL HYSTERECTOMY WITH BILATERAL SALPINGO OOPHERECTOMY Bilateral 12/30/2015   Procedure: XI ROBOTIC ASSISTED TOTAL HYSTERECTOMY WITH BILATERAL SALPINGO OOPHORECTOMY WITH SENTAL LYMPH NODE BIOPSY;  Surgeon: Cleda Mccreedy, MD;  Location: [facility];  Service: Gynecology;  Laterality: Bilateral;   TONSILLECTOMY     TRANSTHORACIC ECHOCARDIOGRAM  04/2007   EF>55%; mild MR; mild-mod TR; mild pulm HTN; mild calcification of aortiv valve leaflets with mild valvular aortic stenosis   TUBAL LIGATION       reports that she has never smoked. She has never used smokeless tobacco. She reports that she does not drink alcohol and does not use drugs.  Family History  Problem Relation Age of Onset   Hypertension Mother    Heart disease Mother        Died in her 77s   Stroke Father    Kidney disease Brother    Heart disease Brother         also HTN, hyperlipidemia   Heart attack Brother    Stroke Sister        x2   Hypertension Sister      Physical Exam: Vitals:   03/01/23 1815 03/01/23 1830 03/01/23 1930 03/01/23 2221  BP: (!) 169/59 (!) 150/63 (!) 181/64 (!) 192/63  Pulse: 79  85 80  Resp: (!) 22 (!) 22 17 20   Temp:    98.3 F (36.8 C)  TempSrc:    Oral  SpO2: 100%  99% 100%  Weight:    66.6 kg  Height:    5\' 1"  (1.549 m)    Gen: Awake, alert, NAD, well-nourished, elderly CV: Regular, normal S1, S2, 3/6 crescendo decrescendo at the RUSB Resp: Normal WOB, CTAB  Abd: Ventral hernia, colostomy, normoactive, nontender MSK: Asymmetric (patient reports chronic) woody tough edema LLE > RLE Skin:  There is chronic venous stasis changes, scaling of the skin, erythema in the left greater than right lower extremity.  There is no increased warmth or proximal extension Neuro: Alert and interactive  Psych: euthymic, appropriate    Data review:   Labs reviewed, notable for:   High-sensitivity troponin 509, up to 519 Hemoglobin 9 D-dimer 0.7 Ferritin 53, transferrin sat 10%  Micro:  Results for orders placed or performed in visit on 06/23/22  Wet prep, genital     Status: Abnormal   Collection Time: 06/23/22  1:15 PM  Result Value Ref Range Status   Yeast Wet Prep HPF POC NONE SEEN NONE SEEN Final   Trich, Wet Prep NONE SEEN NONE SEEN Final   Clue Cells Wet Prep HPF POC PRESENT (A) NONE SEEN Final   WBC, Wet Prep HPF POC <10 <10 Final   Sperm NONE SEEN  Final    Comment: Performed at [facility], [address], [place], [place] [address]    Imaging reviewed:  [facility] Chest Port 1 View  Result Date: 03/01/2023 CLINICAL DATA:  Right lower extremity edema. EXAM: PORTABLE CHEST 1 VIEW COMPARISON:  Chest radiograph dated March 01, 2022. FINDINGS: The heart size and mediastinal contours are within normal limits. Stable right internal jugular Port-A-Cath. Postsurgical changes related to prior CABG.  No focal consolidation, pneumothorax, or sizable pleural effusion. No acute osseous abnormality. IMPRESSION: No active cardiopulmonary disease. Electronically Signed   By: Hart Robinsons M.D.   On: 03/01/2023 16:13   US Venous Img Lower  Left (DVT Study)  Result Date: 03/01/2023 CLINICAL DATA:  82 year old female with edema and swelling EXAM: LEFT LOWER EXTREMITY VENOUS DOPPLER ULTRASOUND TECHNIQUE: Gray-scale sonography with graded compression, as well as color Doppler and duplex ultrasound were performed to evaluate the lower extremity deep venous systems from the level of the common femoral vein and including the common femoral, femoral, profunda femoral, popliteal and calf veins including the posterior tibial, peroneal and gastrocnemius veins when visible. The superficial great saphenous vein was also interrogated. Spectral Doppler was utilized to evaluate flow at rest and with distal augmentation maneuvers in the common femoral, femoral and popliteal veins. COMPARISON:  None Available. FINDINGS: Contralateral Common Femoral Vein: Respiratory phasicity is normal and symmetric with the symptomatic side. No evidence of thrombus. Normal compressibility. Common Femoral Vein: No evidence of thrombus. Normal compressibility, respiratory phasicity and response to augmentation. Saphenofemoral Junction: No evidence of thrombus. Normal compressibility and flow on color Doppler imaging. Profunda Femoral Vein: No evidence of thrombus. Normal compressibility and flow on color Doppler imaging. Femoral Vein: No evidence of thrombus. Normal compressibility, respiratory phasicity and response to augmentation. Popliteal Vein: No evidence of thrombus. Normal compressibility, respiratory phasicity and response to augmentation. Calf Veins: Calf veins not visualized Superficial Great Saphenous Vein: No evidence of thrombus. Normal compressibility and flow on color Doppler imaging. Other Findings:  Edema IMPRESSION: Directed  duplex of the left lower extremity negative for DVT Signed, Yvone Neu. Miachel Roux, RPVI [facility] Radiology Electronically Signed   By: Gilmer Mor D.O.   On: 03/01/2023 15:54    EKG:  Sinus rhythm with T wave inversions/biphasic T waves inferolaterally.  Compared to prior, T wave changes appear more prominent.  OSH ED Course:  Workup revealing non-STEMI.  Cardiology (Dr. Mayford Knife) was consulted the ED provider who recommended heparin drip, transfer to [facility] for non-STEMI.   Assessment/Plan:  82 y.o. female with hx CABG x 3 (LIMA-LAD,  SVG, OM, SVG, PD) in '05, moderate aortic stenosis, locally advanced endometrial cancer status post chemoradiation, immunotherapy, hysterectomy BSO, colonic mass with partial colectomy and colostomy, prior DVT, A-fib on anticoagulation, hypertension, hyperlipidemia, statin intolerance, lymphedema, asthma, hypothyroidism, who was transferred from [facility] for non-STEMI  Non-STEMI Hx CABG x 3 (LIMA-LAD, SVG, OM, SVG, PD) in '05 Moderate aortic stenosis 1 week of new exertional symptoms, acutely worse today.  Chest pain has resolved since the episode earlier today.  EKG shows T wave inversion/fibrosis T waves inferolaterally.  High-sensitivity troponin 509, uptrending to 519.  -Cardiology consulted, appreciate recommendations -Continue heparin drip, pharmacy to dose -Status post aspirin 324 mg load, continue aspirin 81 mg daily for now.  Additional antiplatelet per cardiology rec -History of statin intolerance, continue Zetia, outpatient Repatha -Consider beta-blocker, defer to cards consult -Check lipids and A1c -TTE reeval aortic stenosis -Telemonitoring -N.p.o. after midnight in case of cath  History iron deficiency anemia Receives IV iron outpatient.  Last seen 5/possibly 24.  Hemoglobin on admission is 9.  She has been treated for ACS but no clear active ischemia.  Ferritin 53,  transferrin sat at 10% -If has recurrent episode of chest pain, worsening EKG findings, worsening troponin would transfuse to a hemoglobin level of 10 -Obtain type and screen, -Otherwise hemoglobin threshold greater than 8 -Start IV iron sucrose 10 milligrams IV every 24 hours x 3 doses  Severe range hypertension -Resume her home diltiazem 300 mg ER -Consider beta-blocker, defer to cards -If remains in severe range despite resumption of home meds, consider labetalol IV for SBP greater than 180  Chronic medical problems: History of locally advanced endometrial cancer: Outpatient oncology follow-up History of partial colectomy and colostomy Cancer-related pain, chronic pain: Continue home tramadol 100 mg twice daily as needed A-fib: Hold her home Eliquis while she is on a heparin drip, continue diltiazem History of prior DVT Lymphedema: Elevate lower extremities Hypothyroidism: Continue home levothyroxine  Body mass index is 27.76 kg/m.    DVT prophylaxis:  IV heparin gtts Code Status:  Full Code; initially she was undecided said that she would not want to be on a ventilator. Wants to think this over, for now she would like to be full code Diet:  Diet Orders (From admission, onward)     Start     Ordered   03/01/23 2223  Diet NPO time specified Except for: Sips with Meds  Diet effective now       Question:  Except for  Answer:  Clearance Coots with Meds   03/01/23 2225           Family Communication:  Yes discussed with son and daughter at bedside   Consults:  Cardiology   Admission status:   Inpatient, Step Down Unit  Severity of Illness: The appropriate patient status for this patient is INPATIENT. Inpatient status is judged to be reasonable and necessary in order to provide the required intensity of service to ensure the patient's safety. The patient's presenting symptoms, physical exam findings, and initial radiographic and laboratory data in the context of their chronic  comorbidities is felt to place them at high risk for further clinical deterioration. Furthermore, it is not anticipated that the patient will be medically stable for discharge from the hospital within 2 midnights of admission.   * I certify that at the point of admission it is my clinical judgment that the patient will require inpatient hospital care spanning beyond 2 midnights from the point of admission due  to high intensity of service, high risk for further deterioration and high frequency of surveillance required.*   Dolly Rias, MD [facility]  How to contact the [facility] Attending or Consulting provider 7A - 7P or covering provider during after hours 7P -7A, for this patient.  Check the care team in [facility] and look for a) attending/consulting TR[facility] provider listed and b) the [facility] team listed Log into [email or web address] and use [place]'s universal password to access. If you do not have the password, please contact the hospital operator. Locate the [facility] provider you are looking for under [facility] and page to a number that you can be directly reached. If you still have difficulty reaching the provider, please page the [facility] (Director on Call) for the Hospitalists listed on amion for assistance.  03/01/2023, 11:08 PM

## 2023-03-01 NOTE — ED Triage Notes (Signed)
Was seen at Dr Myna Hidalgo office , expressed chest pain in office , sent her for CAd work up . Pt denies chest pain or shortness of breath during triage . Reports worsening to her right leg edema , been worse x 2 weeks

## 2023-03-02 ENCOUNTER — Encounter (HOSPITAL_COMMUNITY)
Admission: EM | Disposition: A | Discharge status: Home / Self Care | Payer: Self-pay | Source: Home / Self Care | Attending: Internal Medicine

## 2023-03-02 ENCOUNTER — Other Ambulatory Visit (HOSPITAL_COMMUNITY): Payer: Medicare Other

## 2023-03-02 DIAGNOSIS — I214 Non-ST elevation (NSTEMI) myocardial infarction: Secondary | ICD-10-CM

## 2023-03-02 DIAGNOSIS — I2510 Atherosclerotic heart disease of native coronary artery without angina pectoris: Secondary | ICD-10-CM | POA: Diagnosis not present

## 2023-03-02 HISTORY — PX: CORONARY/GRAFT ANGIOGRAPHY: CATH118237

## 2023-03-02 LAB — CBC
HCT: 27.7 % — ABNORMAL LOW (ref 36.0–46.0)
Hemoglobin: 8.6 g/dL — ABNORMAL LOW (ref 12.0–15.0)
MCH: 25.7 pg — ABNORMAL LOW (ref 26.0–34.0)
MCHC: 31 g/dL (ref 30.0–36.0)
MCV: 82.9 fL (ref 80.0–100.0)
Platelets: 283 10*3/uL (ref 150–400)
RBC: 3.34 MIL/uL — ABNORMAL LOW (ref 3.87–5.11)
RDW: 15.5 % (ref 11.5–15.5)
WBC: 5 10*3/uL (ref 4.0–10.5)
nRBC: 0 % (ref 0.0–0.2)

## 2023-03-02 LAB — URINALYSIS, ROUTINE W REFLEX MICROSCOPIC
Bilirubin Urine: NEGATIVE
Glucose, UA: NEGATIVE mg/dL
Hgb urine dipstick: NEGATIVE
Ketones, ur: 5 mg/dL — AB
Leukocytes,Ua: NEGATIVE
Nitrite: NEGATIVE
Protein, ur: NEGATIVE mg/dL
Specific Gravity, Urine: 1.005 (ref 1.005–1.030)
pH: 8 (ref 5.0–8.0)

## 2023-03-02 LAB — BASIC METABOLIC PANEL
Anion gap: 14 (ref 5–15)
BUN: 8 mg/dL (ref 8–23)
CO2: 24 mmol/L (ref 22–32)
Calcium: 8.9 mg/dL (ref 8.9–10.3)
Chloride: 104 mmol/L (ref 98–111)
Creatinine, Ser: 0.71 mg/dL (ref 0.44–1.00)
GFR, Estimated: >60 mL/min (ref >60)
Glucose, Bld: 106 mg/dL — ABNORMAL HIGH (ref 70–99)
Potassium: 3.1 mmol/L — ABNORMAL LOW (ref 3.5–5.1)
Sodium: 142 mmol/L (ref 135–145)

## 2023-03-02 LAB — APTT: aPTT: 152 s — ABNORMAL HIGH (ref 24–36)

## 2023-03-02 LAB — HEMOGLOBIN A1C
Hgb A1c MFr Bld: 5.5 % (ref 4.8–5.6)
Mean Plasma Glucose: 111.15 mg/dL

## 2023-03-02 LAB — LIPID PANEL
Cholesterol: 194 mg/dL (ref 0–200)
HDL: 55 mg/dL (ref >40)
LDL Cholesterol: 127 mg/dL — ABNORMAL HIGH (ref 0–99)
Total CHOL/HDL Ratio: 3.5 {ratio}
Triglycerides: 60 mg/dL (ref <150)
VLDL: 12 mg/dL (ref 0–40)

## 2023-03-02 LAB — PHOSPHORUS: Phosphorus: 3.5 mg/dL (ref 2.5–4.6)

## 2023-03-02 LAB — PROTIME-INR
INR: 1.5 — ABNORMAL HIGH (ref 0.8–1.2)
Prothrombin Time: 18.3 s — ABNORMAL HIGH (ref 11.4–15.2)

## 2023-03-02 LAB — MRSA NEXT GEN BY PCR, NASAL: MRSA by PCR Next Gen: NOT DETECTED

## 2023-03-02 LAB — TROPONIN I (HIGH SENSITIVITY): Troponin I (High Sensitivity): 628 ng/L (ref <18)

## 2023-03-02 LAB — CA 125: Cancer Antigen (CA) 125: 12.6 U/mL (ref 0.0–38.1)

## 2023-03-02 LAB — MAGNESIUM: Magnesium: 1.8 mg/dL (ref 1.7–2.4)

## 2023-03-02 LAB — HEPARIN LEVEL (UNFRACTIONATED): Heparin Unfractionated: >1.1 [IU]/mL — ABNORMAL HIGH (ref 0.30–0.70)

## 2023-03-02 SURGERY — CORONARY/GRAFT ANGIOGRAPHY
Anesthesia: LOCAL

## 2023-03-02 MED ORDER — LIDOCAINE HCL (PF) 1 % IJ SOLN
INTRAMUSCULAR | Status: DC | PRN
  Administered 2023-03-02: 2 mL

## 2023-03-02 MED ORDER — HEPARIN (PORCINE) IN NACL 1000-0.9 UT/500ML-% IV SOLN
INTRAVENOUS | Status: DC | PRN
  Administered 2023-03-02 (×2): 500 mL

## 2023-03-02 MED ORDER — SODIUM CHLORIDE 0.9% FLUSH: 3.0000 mL | INTRAVENOUS | Status: DC | PRN

## 2023-03-02 MED ORDER — SODIUM CHLORIDE 0.9 % WEIGHT BASED INFUSION: 1.0000 mL/kg/h | INTRAVENOUS | Status: DC

## 2023-03-02 MED ORDER — HEPARIN SODIUM (PORCINE) 1000 UNIT/ML IJ SOLN
INTRAMUSCULAR | Status: DC | PRN
  Administered 2023-03-02: 3000 [IU] via INTRAVENOUS

## 2023-03-02 MED ORDER — ASPIRIN 81 MG PO CHEW: 81.0000 mg | CHEWABLE_TABLET | ORAL | Status: DC

## 2023-03-02 MED ORDER — MIDAZOLAM HCL 2 MG/2ML IJ SOLN
INTRAMUSCULAR | Status: AC
  Filled 2023-03-02: qty 2

## 2023-03-02 MED ORDER — CHLORHEXIDINE GLUCONATE CLOTH 2 % EX PADS
6.0000 | MEDICATED_PAD | Freq: Every day | CUTANEOUS | Status: DC
  Administered 2023-03-02 – 2023-03-04 (×3): 6 via TOPICAL

## 2023-03-02 MED ORDER — VERAPAMIL HCL 2.5 MG/ML IV SOLN
INTRAVENOUS | Status: DC | PRN
  Administered 2023-03-02: 10 mL via INTRA_ARTERIAL

## 2023-03-02 MED ORDER — VERAPAMIL HCL 2.5 MG/ML IV SOLN
INTRAVENOUS | Status: AC
  Filled 2023-03-02: qty 2

## 2023-03-02 MED ORDER — SODIUM CHLORIDE 0.9% FLUSH
10.0000 mL | Freq: Two times a day (BID) | INTRAVENOUS | Status: DC
  Administered 2023-03-02 – 2023-03-04 (×5): 10 mL

## 2023-03-02 MED ORDER — CARVEDILOL 6.25 MG PO TABS
6.2500 mg | ORAL_TABLET | Freq: Two times a day (BID) | ORAL | Status: DC
  Administered 2023-03-02 – 2023-03-03 (×5): 6.25 mg via ORAL
  Filled 2023-03-02 (×5): qty 1

## 2023-03-02 MED ORDER — SODIUM CHLORIDE 0.9 % WEIGHT BASED INFUSION: 3.0000 mL/kg/h | INTRAVENOUS | Status: DC

## 2023-03-02 MED ORDER — POTASSIUM CHLORIDE CRYS ER 20 MEQ PO TBCR
40.0000 meq | EXTENDED_RELEASE_TABLET | ORAL | Status: AC
  Administered 2023-03-02 (×2): 40 meq via ORAL
  Filled 2023-03-02: qty 4
  Filled 2023-03-02: qty 2

## 2023-03-02 MED ORDER — MIDAZOLAM HCL 2 MG/2ML IJ SOLN
INTRAMUSCULAR | Status: DC | PRN
  Administered 2023-03-02: 1 mg via INTRAVENOUS

## 2023-03-02 MED ORDER — FENTANYL CITRATE (PF) 100 MCG/2ML IJ SOLN
INTRAMUSCULAR | Status: DC | PRN
  Administered 2023-03-02 (×2): 25 ug via INTRAVENOUS

## 2023-03-02 MED ORDER — ONDANSETRON HCL 4 MG/2ML IJ SOLN
4.0000 mg | Freq: Four times a day (QID) | INTRAMUSCULAR | Status: DC | PRN
  Administered 2023-03-03: 4 mg via INTRAVENOUS
  Filled 2023-03-02: qty 2

## 2023-03-02 MED ORDER — SODIUM CHLORIDE 0.9% FLUSH
3.0000 mL | Freq: Two times a day (BID) | INTRAVENOUS | Status: DC
  Administered 2023-03-03 – 2023-03-04 (×3): 3 mL via INTRAVENOUS

## 2023-03-02 MED ORDER — SODIUM CHLORIDE 0.9 % IV SOLN: 250.0000 mL | INTRAVENOUS | Status: DC | PRN

## 2023-03-02 MED ORDER — FENTANYL CITRATE (PF) 100 MCG/2ML IJ SOLN
INTRAMUSCULAR | Status: AC
  Filled 2023-03-02: qty 2

## 2023-03-02 MED ORDER — LIDOCAINE HCL (PF) 1 % IJ SOLN
INTRAMUSCULAR | Status: AC
  Filled 2023-03-02: qty 30

## 2023-03-02 MED ORDER — HEPARIN (PORCINE) 25000 UT/250ML-% IV SOLN: 600.0000 [IU]/h | INTRAVENOUS | Status: DC

## 2023-03-02 MED ORDER — MAGNESIUM SULFATE 2 GM/50ML IV SOLN
2.0000 g | Freq: Once | INTRAVENOUS | Status: AC
  Administered 2023-03-02: 2 g via INTRAVENOUS
  Filled 2023-03-02: qty 50

## 2023-03-02 MED ORDER — SODIUM CHLORIDE 0.9 % WEIGHT BASED INFUSION
1.0000 mL/kg/h | INTRAVENOUS | Status: AC
  Administered 2023-03-02: 1 mL/kg/h via INTRAVENOUS

## 2023-03-02 MED ORDER — IOHEXOL 350 MG/ML SOLN
INTRAVENOUS | Status: DC | PRN
  Administered 2023-03-02: 40 mL

## 2023-03-02 MED ORDER — HEPARIN SODIUM (PORCINE) 1000 UNIT/ML IJ SOLN
INTRAMUSCULAR | Status: AC
  Filled 2023-03-02: qty 10

## 2023-03-02 SURGICAL SUPPLY — 12 items
CATH INFINITI 5 FR IM (CATHETERS) IMPLANT
CATH INFINITI 5 FR JL3.5 (CATHETERS) IMPLANT
CATH INFINITI 5FR ANG PIGTAIL (CATHETERS) IMPLANT
CATH INFINITI AMBI 5FR TG (CATHETERS) IMPLANT
DEVICE RAD COMP TR BAND LRG (VASCULAR PRODUCTS) IMPLANT
DEVICE RAD TR BAND REGULAR (VASCULAR PRODUCTS) IMPLANT
GLIDESHEATH SLEND A-KIT 6F 22G (SHEATH) IMPLANT
GUIDEWIRE INQWIRE 1.5J.035X260 (WIRE) IMPLANT
INQWIRE 1.5J .035X260CM (WIRE) ×1
PACK CARDIAC CATHETERIZATION (CUSTOM PROCEDURE TRAY) ×1 IMPLANT
SET ATX-X65L (MISCELLANEOUS) IMPLANT
SHEATH PROBE COVER 6X72 (BAG) IMPLANT

## 2023-03-02 NOTE — Progress Notes (Addendum)
PROGRESS NOTE    Shannon Scott  ION:[id number] DOB: 12-13-1940 DOA: 03/01/2023 PCP: [name], MD   Brief Narrative:  82 y.o. female with hx of CABG x 3, moderate aortic stenosis, locally advanced endometrial cancer status post chemoradiation, immunotherapy, hysterectomy BSO, colonic mass with partial colectomy and colostomy, prior DVT, A-fib on anticoagulation, hypertension, hyperlipidemia, statin intolerance, lymphedema, asthma, hypothyroidism presented with chest pain and dyspnea on exertion.  On presentation, troponins were 509 and 519.  Cardiology was consulted.  She was started on heparin drip.  Assessment & Plan:   Non-STEMI in a patient with history of CAD status post CABG x 3 Moderate aortic stenosis -Presented with chest pain and exertional dyspnea.  EKG showed T wave inversion inferior laterally.  High-sensitivity troponins were 509 and 519. -Cardiology following.  Currently on heparin drip.  Cardiology planning for cardiac cath at some point.  Continue NPO. -Continue aspirin, Coreg, ezetimibe.  History of statin intolerance -Follow-up 2D echo.  Paroxysmal A-fib -Continue Coreg and heparin drip.  Eliquis on hold.  Hypokalemia Replace.  Repeat a.m. labs  History of iron deficiency anemia -Received IV iron outpatient.  Hemoglobin 8.6 this morning.  Monitor.  Currently getting IV iron.  Hypertension -Continue Coreg and diltiazem.  Hypothyroidism -continue levothyroxine  History of locally advanced endometrial cancer -Outpatient oncology follow-up.  Cancer-related pain/chronic pain -Continue tramadol as needed  Chronic lymphedema -Elevate lower extremities.  Outpatient follow-up  DVT prophylaxis: Heparin drip Code Status: Full Family Communication: Son at bedside Disposition Plan: Status is: Inpatient Remains inpatient appropriate because: Of severity of illness  Consultants: Cardiology  Procedures: None  Antimicrobials:  None   Subjective: Patient seen and examined at bedside.  Denies any chest pain currently.  No fever, vomiting or abdominal pain reported.  Objective: Vitals:   03/01/23 2221 03/02/23 0004 03/02/23 0447 03/02/23 0728  BP: (!) 192/63 (!) 192/74 (!) 137/47 (!) 145/56  Pulse: 80 83 64 62  Resp: 20 20 18 18   Temp: 98.3 F (36.8 C) 97.6 F (36.4 C) 98.2 F (36.8 C) 97.8 F (36.6 C)  TempSrc: Oral Oral Oral Oral  SpO2: 100% 99% 93% 94%  Weight: 66.6 kg  65.8 kg   Height: 5\' 1"  (1.549 m)       Intake/Output Summary (Last 24 hours) at 03/02/2023 1025 Last data filed at 03/02/2023 0448 Gross per 24 hour  Intake --  Output 1800 ml  Net -1800 ml   Filed Weights   03/01/23 1739 03/01/23 2221 03/02/23 0447  Weight: 70 kg 66.6 kg 65.8 kg    Examination:  General exam: Appears calm and comfortable.  Currently on room air.  Looks chronically ill and deconditioned. Respiratory system: Bilateral decreased breath sounds at bases with scattered crackles Cardiovascular system: S1 & S2 heard, Rate controlled Gastrointestinal system: Abdomen is nondistended, soft and nontender. Normal bowel sounds heard. Extremities: No cyanosis, clubbing; bilateral lower extremity lymphedema present with chronic venous stasis changes; scaling of the skin, erythema in the left greater than right lower extremity. Central nervous system: Alert and oriented. No focal neurological deficits. Moving extremities Skin: No rashes, lesions or ulcers Psychiatry: Flat affect.  Not agitated.    Data Reviewed: I have personally reviewed following labs and imaging studies  CBC: Recent Labs  Lab 03/01/23 1210 03/02/23 0340  WBC 4.6 5.0  NEUTROABS 3.4  --   HGB 9.0* 8.6*  HCT 29.0* 27.7*  MCV 84.3 82.9  PLT 277 283   Basic Metabolic Panel: Recent Labs  Lab  03/01/23 1210 03/02/23 0340  NA 140 142  K 3.8 3.1*  CL 104 104  CO2 27 24  GLUCOSE 101* 106*  BUN 11 8  CREATININE 0.82 0.71  CALCIUM 8.9 8.9  MG   --  1.8  PHOS  --  3.5   GFR: Estimated Creatinine Clearance: 47.9 mL/min (by C-G formula based on SCr of 0.71 mg/dL). Liver Function Tests: Recent Labs  Lab 03/01/23 1210  AST 13*  ALT 5  ALKPHOS 80  BILITOT 0.3  PROT 6.8  ALBUMIN 3.8   No results for input(s): "LIPASE", "AMYLASE" in the last 168 hours. No results for input(s): "AMMONIA" in the last 168 hours. Coagulation Profile: Recent Labs  Lab 03/02/23 0340  INR 1.5*   Cardiac Enzymes: No results for input(s): "CKTOTAL", "CKMB", "CKMBINDEX", "TROPONINI" in the last 168 hours. BNP (last 3 results) No results for input(s): "PROBNP" in the last 8760 hours. HbA1C: Recent Labs    03/02/23 0340  HGBA1C 5.5   CBG: No results for input(s): "GLUCAP" in the last 168 hours. Lipid Profile: Recent Labs    03/02/23 0340  CHOL 194  HDL 55  LDLCALC 127*  TRIG 60  CHOLHDL 3.5   Thyroid Function Tests: No results for input(s): "TSH", "T4TOTAL", "FREET4", "T3FREE", "THYROIDAB" in the last 72 hours. Anemia Panel: Recent Labs    03/01/23 1210  FERRITIN 53  TIBC 262  IRON 25*  RETICCTPCT 1.1   Sepsis Labs: No results for input(s): "PROCALCITON", "LATICACIDVEN" in the last 168 hours.  Recent Results (from the past 240 hour(s))  MRSA Next Gen by PCR, Nasal     Status: None   Collection Time: 03/01/23 11:37 PM   Specimen: Urine, Clean Catch; Nasal Swab  Result Value Ref Range Status   MRSA by PCR Next Gen NOT DETECTED NOT DETECTED Final    Comment: (NOTE) The GeneXpert MRSA Assay (FDA approved for NASAL specimens only), is one component of a comprehensive MRSA colonization surveillance program. It is not intended to diagnose MRSA infection nor to guide or monitor treatment for MRSA infections. Test performance is not FDA approved in patients less than 14 years old. Performed at [facility], [address], [place], [place] [address]          Radiology Studies: DG Chest Port 1 View  Result Date:  03/01/2023 CLINICAL DATA:  Right lower extremity edema. EXAM: PORTABLE CHEST 1 VIEW COMPARISON:  Chest radiograph dated March 01, 2022. FINDINGS: The heart size and mediastinal contours are within normal limits. Stable right internal jugular Port-A-Cath. Postsurgical changes related to prior CABG. No focal consolidation, pneumothorax, or sizable pleural effusion. No acute osseous abnormality. IMPRESSION: No active cardiopulmonary disease. Electronically Signed   By: Hart Robinsons M.D.   On: 03/01/2023 16:13   US Venous Img Lower  Left (DVT Study)  Result Date: 03/01/2023 CLINICAL DATA:  82 year old female with edema and swelling EXAM: LEFT LOWER EXTREMITY VENOUS DOPPLER ULTRASOUND TECHNIQUE: Gray-scale sonography with graded compression, as well as color Doppler and duplex ultrasound were performed to evaluate the lower extremity deep venous systems from the level of the common femoral vein and including the common femoral, femoral, profunda femoral, popliteal and calf veins including the posterior tibial, peroneal and gastrocnemius veins when visible. The superficial great saphenous vein was also interrogated. Spectral Doppler was utilized to evaluate flow at rest and with distal augmentation maneuvers in the common femoral, femoral and popliteal veins. COMPARISON:  None Available. FINDINGS: Contralateral Common Femoral Vein:  Respiratory phasicity is normal and symmetric with the symptomatic side. No evidence of thrombus. Normal compressibility. Common Femoral Vein: No evidence of thrombus. Normal compressibility, respiratory phasicity and response to augmentation. Saphenofemoral Junction: No evidence of thrombus. Normal compressibility and flow on color Doppler imaging. Profunda Femoral Vein: No evidence of thrombus. Normal compressibility and flow on color Doppler imaging. Femoral Vein: No evidence of thrombus. Normal compressibility, respiratory phasicity and response to augmentation. Popliteal Vein: No  evidence of thrombus. Normal compressibility, respiratory phasicity and response to augmentation. Calf Veins: Calf veins not visualized Superficial Great Saphenous Vein: No evidence of thrombus. Normal compressibility and flow on color Doppler imaging. Other Findings:  Edema IMPRESSION: Directed duplex of the left lower extremity negative for DVT Signed, Yvone Neu. Miachel Roux, RPVI [facility] Radiology Electronically Signed   By: Gilmer Mor D.O.   On: 03/01/2023 15:54        Scheduled Meds:  aspirin  81 mg Oral Daily   carvedilol  6.25 mg Oral BID WC   Chlorhexidine Gluconate Cloth  6 each Topical Daily   diltiazem  300 mg Oral QHS   ezetimibe  10 mg Oral Daily   levothyroxine  50 mcg Oral QAC breakfast   potassium chloride  40 mEq Oral Q4H   sodium chloride flush  10-40 mL Intracatheter Q12H   Continuous Infusions:  heparin 600 Units/hr (03/02/23 0609)   iron sucrose     magnesium sulfate bolus IVPB            Glade Lloyd, MD [facility] 03/02/2023, 10:25 AM

## 2023-03-02 NOTE — Consult Note (Signed)
Cardiology Consultation   Patient ID: Shannon Scott MRN: [id number]; DOB: Jan 18, 1941  Admit date: 03/01/2023 Date of Consult: 03/02/2023  PCP:  [name], MD   [facility] Providers Cardiologist:  Chrystie Nose, MD        Patient Profile:   Shannon Scott is a 82 y.o. female with a hx of CAD s/p 3v CABG who is being seen 03/02/2023 for the evaluation of chest pain.   History of Present Illness:   10F with CAD s/p 3v CABG 2005, moderate AS, pAF, hx of CVA, FH, HTN, carotid stenosis, hx of endometrial cancer, and LLE lymphedema presents with chest pain. She was walking to her oncology appointment from the parking lot and started having left sided chest pain that lasted several minutes and resolved with rest. She has also been having jaw pain intermittently over the past week that occurs sporadically and not always associated with exertion. This is similar to her presentation prior to her CABG in 2005. Currently no chest pain. No dyspnea, orthopnea, palpitations, syncope, falls. She has LLE lymphedema that is stable.    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) 10/11/2017   Postoperative nausea and vomiting 01/02/2016    Past Surgical History:  Procedure Laterality Date   ABDOMINAL HYSTERECTOMY     Carotid Doppler  02/2012   40-59% right int carotid artery stenosis;  60-79% L int carotid stenosis; L carotid bruit   CORONARY ARTERY BYPASS GRAFT  03/12/2004   LIMA to LAD, SVG to circumflex, SVG to PDA   CYSTOSCOPY W/ URETERAL STENT PLACEMENT Left 12/06/2017   Procedure: CYSTOSCOPY WITH LEFT URETERAL STENT EXCHANGE;  Surgeon: Bjorn Pippin, MD;  Location: [facility];  Service: Urology;  Laterality: Left;   CYSTOSCOPY WITH STENT PLACEMENT Bilateral 08/21/2017   Procedure: CYSTOSCOPY WITH STENT PLACEMENT;  Surgeon: Rene Paci, MD;  Location: [facility];  Service: Urology;  Laterality: Bilateral;   IR FLUORO GUIDE PORT INSERTION RIGHT  10/11/2017   IR GENERIC HISTORICAL  04/29/2016   IR RADIOLOGIST EVAL & MGMT 04/29/2016 Simonne Come, MD [facility]   IR GENERIC HISTORICAL  05/12/2016   IR RADIOLOGIST EVAL & MGMT 05/12/2016 Simonne Come, MD [facility]   IR IVC FILTER PLMT / S&I Lenise Arena GUID/MOD SED  10/11/2017   IR US GUIDE VASC ACCESS RIGHT  10/11/2017   ROBOTIC ASSISTED TOTAL HYSTERECTOMY WITH BILATERAL SALPINGO OOPHERECTOMY Bilateral 12/30/2015   Procedure: XI ROBOTIC ASSISTED TOTAL HYSTERECTOMY WITH BILATERAL SALPINGO OOPHORECTOMY WITH SENTAL LYMPH NODE BIOPSY;  Surgeon: Cleda Mccreedy, MD;  Location: [facility];  Service: Gynecology;  Laterality: Bilateral;   TONSILLECTOMY     TRANSTHORACIC ECHOCARDIOGRAM  04/2007   EF>55%; mild MR; mild-mod TR; mild pulm HTN; mild calcification of aortiv valve leaflets with mild valvular aortic stenosis  TUBAL LIGATION       Home Medications:  Prior to Admission medications   Medication Sig Start Date End Date Taking? Authorizing Provider  apixaban (ELIQUIS) 5 MG TABS tablet Take 1 tablet (5 mg total) by mouth 2 (two) times daily. 03/29/20   Amin, Ankit C, MD  clobetasol ointment (TEMOVATE) 0.05 % Apply 1 Application topically 2 (two) times a week. 06/24/22   Antionette Char, MD  diltiazem (TIAZAC) 300 MG 24 hr capsule TAKE 1 CAPSULE(300 MG) BY MOUTH DAILY Patient taking differently: Take 300 mg by mouth at bedtime.  06/13/18   Josph Macho, MD  ezetimibe (ZETIA) 10 MG tablet Take 10 mg by mouth daily. 06/05/21   [provider]  levothyroxine (SYNTHROID, LEVOTHROID) 50 MCG tablet Take 1 tablet (50 mcg total) by mouth daily before breakfast. 10/25/17   Tanner, Kathrin Greathouse., PA-C  mometasone (ELOCON) 0.1 % cream Apply 1 Application topically daily as needed (to the vulva for irritation and itching). 05/18/22   Warner Mccreedy D, NP  nystatin (MYCOSTATIN/NYSTOP) powder Apply 1 Application topically 3 (three) times daily. 09/03/22   Carver Fila, MD  polyethylene glycol (MIRALAX / GLYCOLAX) 17 g packet Take 17 g by mouth daily as needed for severe constipation. Patient taking differently: Take 17 g by mouth daily. 03/29/20   Miguel Rota, MD  REPATHA SURECLICK 140 MG/ML SOAJ INJECT 1 DOSE UNDER THE SKIN EVERY 14 DAYS 06/21/22   Hilty, Lisette Abu, MD  traMADol (ULTRAM) 50 MG tablet TAKE 2 TABLETS(100 MG) BY MOUTH TWICE DAILY 02/04/23   Josph Macho, MD    Inpatient Medications: Scheduled Meds:  aspirin  81 mg Oral Daily   carvedilol  6.25 mg Oral BID WC   diltiazem  300 mg Oral QHS   ezetimibe  10 mg Oral Daily   levothyroxine  50 mcg Oral QAC breakfast   Continuous Infusions:  heparin 750 Units/hr (03/01/23 1857)   iron sucrose     PRN Meds: acetaminophen, polyethylene glycol, traMADol  Allergies:    Allergies  Allergen Reactions   Statins Other (See Comments)    Myalgias and memory problems   Ciprofloxacin Itching and Other (See Comments)    Splotchy redness with itching during IV infusion localized to arm. Other reaction(s): itching/swelling/redness at injection site    Social History:   Social History   Socioeconomic History   Marital status: Widowed    Spouse name: Not on file   Number of children: 2   Years of education: Not on file   Highest education level: Not on file  Occupational History    Employer: RETIRED  Tobacco Use   Smoking status: Never   Smokeless tobacco:  Never  Vaping Use   Vaping status: Never Used  Substance and Sexual Activity   Alcohol use: No   Drug use: No   Sexual activity: Not Currently  Other Topics Concern   Not on file  Social History Narrative   Not on file   Social Determinants of Health   Financial Resource Strain: Not on file  Food Insecurity: No Food Insecurity (03/01/2023)   Hunger Vital Sign    Worried About Running Out of Food in the Last Year: Never true    Ran Out of Food in the Last Year: Never true  Transportation Needs: No Transportation Needs (03/01/2023)   PRAPARE - Administrator, Civil Service (Medical): No    Lack of Transportation (Non-Medical): No  Physical Activity: Not on  file  Stress: Not on file  Social Connections: Not on file  Intimate Partner Violence: Not At Risk (03/01/2023)   Humiliation, Afraid, Rape, and Kick questionnaire    Fear of Current or Ex-Partner: No    Emotionally Abused: No    Physically Abused: No    Sexually Abused: No    Family History:   Family History  Problem Relation Age of Onset   Hypertension Mother    Heart disease Mother        Died in her 74s   Stroke Father    Kidney disease Brother    Heart disease Brother        also HTN, hyperlipidemia   Heart attack Brother    Stroke Sister        x2   Hypertension Sister      ROS:  Please see the history of present illness.  All other ROS reviewed and negative.     Physical Exam/Data:   Vitals:   03/01/23 1830 03/01/23 1930 03/01/23 2221 03/02/23 0004  BP: (!) 150/63 (!) 181/64 (!) 192/63 (!) 192/74  Pulse:  85 80 83  Resp: (!) 22 17 20 20   Temp:   98.3 F (36.8 C) 97.6 F (36.4 C)  TempSrc:   Oral Oral  SpO2:  99% 100% 99%  Weight:   66.6 kg   Height:   5\' 1"  (1.549 m)     Intake/Output Summary (Last 24 hours) at 03/02/2023 0038 Last data filed at 03/01/2023 1819 Gross per 24 hour  Intake --  Output 1500 ml  Net -1500 ml      03/01/2023   10:21 PM 03/01/2023    5:39 PM 03/01/2023     1:51 PM  Last 3 Weights  Weight (lbs) 146 lb 14.4 oz 154 lb 5.2 oz 154 lb 5.2 oz  Weight (kg) 66.633 kg 70 kg 70 kg     Body mass index is 27.76 kg/m.  General:  Well nourished, well developed, in no acute distress HEENT: normal Neck: no JVD Vascular: Distal pulses 2+ bilaterally Cardiac:  normal S1, S2; RRR; systolic ejection murmur Lungs:  clear to auscultation bilaterally, no wheezing, rhonchi or rales  Abd: soft, nontender, no hepatomegaly  Ext: L>R lymphedema Musculoskeletal:  No deformities Skin: warm and dry  Neuro:  alert and oriented, no focal abnormalities noted Psych:  Normal affect   EKG:  The EKG was personally reviewed  Telemetry:  Telemetry was personally reviewed   Relevant CV Studies: TTE normal EF, moderate AS in 2023  Laboratory Data:  High Sensitivity Troponin:   Recent Labs  Lab 03/01/23 1428 03/01/23 1703  TROPONINIHS 509* 519*     Chemistry Recent Labs  Lab 03/01/23 1210  NA 140  K 3.8  CL 104  CO2 27  GLUCOSE 101*  BUN 11  CREATININE 0.82  CALCIUM 8.9  GFRNONAA >60  ANIONGAP 9    Recent Labs  Lab 03/01/23 1210  PROT 6.8  ALBUMIN 3.8  AST 13*  ALT 5  ALKPHOS 80  BILITOT 0.3   Lipids No results for input(s): "CHOL", "TRIG", "HDL", "LABVLDL", "LDLCALC", "CHOLHDL" in the last 168 hours.  Hematology Recent Labs  Lab 03/01/23 1210  WBC 4.6  RBC 3.44*  3.45*  HGB 9.0*  HCT 29.0*  MCV 84.3  MCH 26.2  MCHC 31.0  RDW 15.6*  PLT 277   Thyroid No results for input(s): "TSH", "FREET4" in the last 168 hours.  BNPNo results for input(s): "BNP", "  PROBNP" in the last 168 hours.  DDimer  Recent Labs  Lab 03/01/23 1428  DDIMER 0.78*     Radiology/Studies:  DG Chest Port 1 View  Result Date: 03/01/2023 CLINICAL DATA:  Right lower extremity edema. EXAM: PORTABLE CHEST 1 VIEW COMPARISON:  Chest radiograph dated March 01, 2022. FINDINGS: The heart size and mediastinal contours are within normal limits. Stable right  internal jugular Port-A-Cath. Postsurgical changes related to prior CABG. No focal consolidation, pneumothorax, or sizable pleural effusion. No acute osseous abnormality. IMPRESSION: No active cardiopulmonary disease. Electronically Signed   By: Hart Robinsons M.D.   On: 03/01/2023 16:13   US Venous Img Lower  Left (DVT Study)  Result Date: 03/01/2023 CLINICAL DATA:  82 year old female with edema and swelling EXAM: LEFT LOWER EXTREMITY VENOUS DOPPLER ULTRASOUND TECHNIQUE: Gray-scale sonography with graded compression, as well as color Doppler and duplex ultrasound were performed to evaluate the lower extremity deep venous systems from the level of the common femoral vein and including the common femoral, femoral, profunda femoral, popliteal and calf veins including the posterior tibial, peroneal and gastrocnemius veins when visible. The superficial great saphenous vein was also interrogated. Spectral Doppler was utilized to evaluate flow at rest and with distal augmentation maneuvers in the common femoral, femoral and popliteal veins. COMPARISON:  None Available. FINDINGS: Contralateral Common Femoral Vein: Respiratory phasicity is normal and symmetric with the symptomatic side. No evidence of thrombus. Normal compressibility. Common Femoral Vein: No evidence of thrombus. Normal compressibility, respiratory phasicity and response to augmentation. Saphenofemoral Junction: No evidence of thrombus. Normal compressibility and flow on color Doppler imaging. Profunda Femoral Vein: No evidence of thrombus. Normal compressibility and flow on color Doppler imaging. Femoral Vein: No evidence of thrombus. Normal compressibility, respiratory phasicity and response to augmentation. Popliteal Vein: No evidence of thrombus. Normal compressibility, respiratory phasicity and response to augmentation. Calf Veins: Calf veins not visualized Superficial Great Saphenous Vein: No evidence of thrombus. Normal compressibility and flow  on color Doppler imaging. Other Findings:  Edema IMPRESSION: Directed duplex of the left lower extremity negative for DVT Signed, Yvone Neu. Miachel Roux, RPVI [facility] Electronically Signed   By: Gilmer Mor D.O.   On: 03/01/2023 15:54     Assessment and Plan:  37F with CAD s/p 3v CABG 2005, moderate AS, pAF, hx of CVA, FH, HTN, carotid stenosis, hx of endometrial cancer, and LLE lymphedema presents with chest pain and troponin 509 - 519 concerning for NSTEMI.  NSTEMI - aspirin 81 mg daily - intolerant to statin, continue ezetimibe - heparin gtt - start coreg 6.25 mg bid - NPO for possible LHC for further ischemic evaluation  Moderate AS - obtain repeat TTE  pAF - coreg 6.25 mg bid - heparin gtt   Risk Assessment/Risk Scores:     TIMI Risk Score for Unstable Angina or Non-ST Elevation MI:   The patient's TIMI risk score is 5, which indicates a 26% risk of all cause mortality, new or recurrent myocardial infarction or need for urgent revascularization in the next 14 days.    CHA2DS2-VASc Score = 7   This indicates a 11.2% annual risk of stroke. The patient's score is based upon: CHF History: 0 HTN History: 1 Diabetes History: 0 Stroke History: 2 Vascular Disease History: 1 Age Score: 2 Gender Score: 1         For questions or updates, please contact [facility] Please consult [email or web address] for contact info under  Lucilla Lame  03/02/2023 12:38 AM

## 2023-03-02 NOTE — TOC Initial Note (Signed)
Transition of Care [facility]) - Initial/Assessment Note    Patient Details  Name: Shannon Scott MRN: [id number] Date of Birth: 1940/12/23  Transition of Care [facility]) CM/SW Contact:    Leone Haven, RN Phone Number: 03/02/2023, 3:38 PM  Clinical Narrative:                 From home alone, has PCP and insurance on file, states has no HH services in place at this time or DME at home.  States son will transport her home at [employer] and son and daughter is support system, states gets medications from [place] on [address].  Pta ambulatory with a cane.  Expected Discharge Plan: Home/Self Care Barriers to Discharge: Continued Medical Work up   Patient Goals and CMS Choice Patient states their goals for this hospitalization and ongoing recovery are:: return home   Choice offered to / list presented to : NA      Expected Discharge Plan and Services In-house Referral: NA Discharge Planning Services: CM Consult Post Acute Care Choice: NA Living arrangements for the past 2 months: Single Family Home                 DME Arranged: N/A DME Agency: NA       HH Arranged: NA          Prior Living Arrangements/Services Living arrangements for the past 2 months: Single Family Home Lives with:: Self Patient language and need for interpreter reviewed:: Yes Do you feel safe going back to the place where you live?: Yes      Need for Family Participation in Patient Care: Yes (Comment) Care giver support system in place?: Yes (comment) Current home services: DME (lymph press) Criminal Activity/Legal Involvement Pertinent to Current Situation/Hospitalization: No - Comment as needed  Activities of Daily Living   ADL Screening (condition at time of admission) Independently performs ADLs?: Yes (appropriate for developmental age) Is the patient deaf or have difficulty hearing?: Yes Does the patient have difficulty seeing, even when wearing glasses/contacts?: No Does the patient have  difficulty concentrating, remembering, or making decisions?: No  Permission Sought/Granted Permission sought to share information with : Case Manager Permission granted to share information with : Yes, Verbal Permission Granted              Emotional Assessment Appearance:: Appears stated age Attitude/Demeanor/Rapport: Engaged Affect (typically observed): Appropriate Orientation: : Oriented to Self, Oriented to Place, Oriented to  Time, Oriented to Situation Alcohol / Substance Use: Not Applicable Psych Involvement: No (comment)  Admission diagnosis:  Unstable angina (HCC) [I20.0] NSTEMI (non-ST elevated myocardial infarction) [facility]) [I21.4] Patient Active Problem List   Diagnosis Date Noted   NSTEMI (non-ST elevated myocardial infarction) (HCC) 03/01/2023   Edema of left lower extremity 03/02/2022   Dyslipidemia 03/02/2022   Hypothyroidism 03/02/2022   Paroxysmal atrial fibrillation (HCC) 03/02/2022   Chest pain 03/01/2022   Vulvar irritation 01/02/2021   Stroke-like episode    Acute CVA (cerebrovascular accident) (HCC) 03/28/2020   Stroke (HCC) 03/26/2020   Expressive aphasia 03/26/2020   Acute vaginitis 01/03/2020   Acute vulvitis 01/03/2020   Lower extremity edema 11/09/2018   Back pain 11/09/2018   IDA (iron deficiency anemia) 10/21/2017   Abdominal mass    Deep vein thrombosis (DVT) of left lower extremity (HCC)    Pelvic mass    Acute blood loss anemia    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 (HCC) 10/11/2017   Goals of care, counseling/discussion  10/11/2017   Rectal bleeding 10/03/2017   Normocytic anemia 10/03/2017   Left hip pain 10/03/2017   Generalized weakness 10/03/2017   Colostomy status (HCC) 08/30/2017   Ureteral stent displacement, subsequent encounter 08/30/2017   Hypophosphatemia 08/30/2017   Hypomagnesemia 08/30/2017   Acute blood loss as cause of postoperative anemia 08/30/2017   Chronic anemia  08/30/2017   Left leg swelling 08/30/2017   GERD (gastroesophageal reflux disease) 08/30/2017   Atrial fibrillation, chronic (HCC)    Bowel perforation (HCC) 08/18/2017   Colonic mass 08/18/2017   Ureteral dilatation 08/18/2017   Intractable right heel pain 10/07/2016   Plantar fasciitis of right foot 10/07/2016   Postoperative nausea and vomiting 01/02/2016   Hyponatremia 01/01/2016   Acute hypokalemia 01/01/2016   Endometrial cancer (HCC) 12/30/2015   Familial hypercholesteremia 07/28/2015   Aortic valve stenosis 08/02/2014   S/P CABG x 3 01/13/2013   Palpitations 01/13/2013   Medication intolerance 01/13/2013   Chronic pain 01/13/2013   TIA (transient ischemic attack) 01/13/2013   Carotid stenosis 01/13/2013   Hypothyroid 10/05/2010   Essential hypertension 10/05/2010   Gastroesophagitis 10/05/2010   Atrophic gastritis 10/05/2010   PCP:  [name], MD Pharmacy:   [facility] [phone] - [place], [place] - [address] AT [facility] [address] [place] [address] Phone: [phone] Fax: [phone]     Social Determinants of Health (SDOH) Social History: SDOH Screenings   Food Insecurity: No Food Insecurity (03/01/2023)  Housing: Low Risk  (03/01/2023)  Transportation Needs: No Transportation Needs (03/01/2023)  Utilities: Not At Risk (03/01/2023)  Tobacco Use: Low Risk  (03/01/2023)   SDOH Interventions:     Readmission Risk Interventions     No data to display

## 2023-03-02 NOTE — Interval H&P Note (Signed)
History and Physical Interval Note:  03/02/2023 11:16 AM  Shannon Scott  has presented today for surgery, with the diagnosis of chest pain.  The various methods of treatment have been discussed with the patient and family. After consideration of risks, benefits and other options for treatment, the patient has consented to  Procedure(s): LEFT HEART CATH AND CORS/GRAFTS ANGIOGRAPHY (N/A) and possible coronary angioplasty as a surgical intervention.  The patient's history has been reviewed, patient examined, no change in status, stable for surgery.  I have reviewed the patient's chart and labs.  Questions were answered to the patient's satisfaction.     Yates Decamp

## 2023-03-02 NOTE — Progress Notes (Signed)
ANTICOAGULATION CONSULT NOTE - Initial Consult  Pharmacy Consult for heparin Indication: chest pain/ACS  Allergies  Allergen Reactions   Statins Other (See Comments)    Myalgias and memory problems   Ciprofloxacin Itching and Other (See Comments)    Splotchy redness with itching during IV infusion localized to arm. Other reaction(s): itching/swelling/redness at injection site    Patient Measurements: Height: 5\' 1"  (154.9 cm) Weight: 65.8 kg (145 lb) IBW/kg (Calculated) : 47.8 Heparin Dosing Weight: 62.8 kg  Vital Signs: Temp: 98.2 F (36.8 C) (10/02 0447) Temp Source: Oral (10/02 0447) BP: 137/47 (10/02 0447) Pulse Rate: 64 (10/02 0447)  Labs: Recent Labs    03/01/23 1210 03/01/23 1428 03/01/23 1703 03/02/23 0023 03/02/23 0340  HGB 9.0*  --   --   --  8.6*  HCT 29.0*  --   --   --  27.7*  PLT 277  --   --   --  283  APTT  --   --   --   --  152*  LABPROT  --   --   --   --  18.3*  INR  --   --   --   --  1.5*  HEPARINUNFRC  --   --   --   --  >1.10*  CREATININE 0.82  --   --   --  0.71  TROPONINIHS  --  509* 519* 628*  --     Estimated Creatinine Clearance: 47.9 mL/min (by C-G formula based on SCr of 0.71 mg/dL).   Medical History: 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) 10/11/2017   Postoperative nausea and vomiting 01/02/2016    Assessment:  82YOF with PMH of CABG  x 3, afib on eliquis PTA, lymphedema, hypertension, asthma, dyslipidemia, malignant neoplasm of uterus  presented to the ED with ACS. Last dose of eliquis at 6AM 10/1. Pharmacy consulted to dose heparin.  Hgb 9, plt 277  10/2 AM: heparin level falsely elvated at >1.1 given recent eliquis dose and aPTT 152 seconds (supratherapeutic) on 750 units/hr. Per RN, no issues with the heparin infusion running continuously or signs/symptoms of bleeding. Levels drawn appropriately. Hgb low, stable, plts WNL  Goal of Therapy:  Heparin level 0.3-0.7 units/ml aPTT 66-102 seconds Monitor platelets by anticoagulation protocol: Yes   Plan:  Hold infusion for 1 hour Decrease heparin infusion at 600 units/hr Check aPTT in 8 hours until levels correlate Check anti-Xa level in 8 hours and daily while on heparin Continue to monitor CBC and for signs/symptoms of bleeding  Arabella Merles, PharmD. Clinical Pharmacist 03/02/2023 4:55 AM

## 2023-03-02 NOTE — Progress Notes (Signed)
Rounding Note    Patient Name: Shannon Scott Date of Encounter: 03/02/2023  [facility] Cardiologist: Chrystie Nose, MD   Subjective   Feeling well this morning. No chest pain.   Inpatient Medications    Scheduled Meds:  aspirin  81 mg Oral Daily   carvedilol  6.25 mg Oral BID WC   Chlorhexidine Gluconate Cloth  6 each Topical Daily   diltiazem  300 mg Oral QHS   ezetimibe  10 mg Oral Daily   levothyroxine  50 mcg Oral QAC breakfast   potassium chloride  40 mEq Oral Q4H   sodium chloride flush  10-40 mL Intracatheter Q12H   Continuous Infusions:  heparin 600 Units/hr (03/02/23 0609)   iron sucrose     PRN Meds: acetaminophen, polyethylene glycol, traMADol   Vital Signs    Vitals:   03/01/23 2221 03/02/23 0004 03/02/23 0447 03/02/23 0728  BP: (!) 192/63 (!) 192/74 (!) 137/47 (!) 145/56  Pulse: 80 83 64 62  Resp: 20 20 18 18   Temp: 98.3 F (36.8 C) 97.6 F (36.4 C) 98.2 F (36.8 C) 97.8 F (36.6 C)  TempSrc: Oral Oral Oral Oral  SpO2: 100% 99% 93% 94%  Weight: 66.6 kg  65.8 kg   Height: 5\' 1"  (1.549 m)       Intake/Output Summary (Last 24 hours) at 03/02/2023 0948 Last data filed at 03/02/2023 0448 Gross per 24 hour  Intake --  Output 1800 ml  Net -1800 ml      03/02/2023    4:47 AM 03/01/2023   10:21 PM 03/01/2023    5:39 PM  Last 3 Weights  Weight (lbs) 145 lb 146 lb 14.4 oz 154 lb 5.2 oz  Weight (kg) 65.772 kg 66.633 kg 70 kg      Telemetry    Sinus Bradycardia - Personally Reviewed  Physical Exam   GEN: No acute distress.   Neck: No JVD Cardiac: RRR, 3/6 systolic murmur, no rubs, or gallops.  Respiratory: Clear to auscultation bilaterally. GI: Soft, nontender, non-distended  MS: No edema; No deformity. Neuro:  Nonfocal  Psych: Normal affect   Labs    High Sensitivity Troponin:   Recent Labs  Lab 03/01/23 1428 03/01/23 1703 03/02/23 0023  TROPONINIHS 509* 519* 628*     Chemistry Recent Labs  Lab  03/01/23 1210 03/02/23 0340  NA 140 142  K 3.8 3.1*  CL 104 104  CO2 27 24  GLUCOSE 101* 106*  BUN 11 8  CREATININE 0.82 0.71  CALCIUM 8.9 8.9  MG  --  1.8  PROT 6.8  --   ALBUMIN 3.8  --   AST 13*  --   ALT 5  --   ALKPHOS 80  --   BILITOT 0.3  --   GFRNONAA >60 >60  ANIONGAP 9 14    Lipids  Recent Labs  Lab 03/02/23 0340  CHOL 194  TRIG 60  HDL 55  LDLCALC 127*  CHOLHDL 3.5    Hematology Recent Labs  Lab 03/01/23 1210 03/02/23 0340  WBC 4.6 5.0  RBC 3.44*  3.45* 3.34*  HGB 9.0* 8.6*  HCT 29.0* 27.7*  MCV 84.3 82.9  MCH 26.2 25.7*  MCHC 31.0 31.0  RDW 15.6* 15.5  PLT 277 283   Thyroid No results for input(s): "TSH", "FREET4" in the last 168 hours.  BNPNo results for input(s): "BNP", "PROBNP" in the last 168 hours.  DDimer  Recent Labs  Lab 03/01/23 1428  DDIMER 0.78*     Radiology    DG Chest Port 1 View  Result Date: 03/01/2023 CLINICAL DATA:  Right lower extremity edema. EXAM: PORTABLE CHEST 1 VIEW COMPARISON:  Chest radiograph dated March 01, 2022. FINDINGS: The heart size and mediastinal contours are within normal limits. Stable right internal jugular Port-A-Cath. Postsurgical changes related to prior CABG. No focal consolidation, pneumothorax, or sizable pleural effusion. No acute osseous abnormality. IMPRESSION: No active cardiopulmonary disease. Electronically Signed   By: Hart Robinsons M.D.   On: 03/01/2023 16:13   US Venous Img Lower  Left (DVT Study)  Result Date: 03/01/2023 CLINICAL DATA:  82 year old female with edema and swelling EXAM: LEFT LOWER EXTREMITY VENOUS DOPPLER ULTRASOUND TECHNIQUE: Gray-scale sonography with graded compression, as well as color Doppler and duplex ultrasound were performed to evaluate the lower extremity deep venous systems from the level of the common femoral vein and including the common femoral, femoral, profunda femoral, popliteal and calf veins including the posterior tibial, peroneal and gastrocnemius  veins when visible. The superficial great saphenous vein was also interrogated. Spectral Doppler was utilized to evaluate flow at rest and with distal augmentation maneuvers in the common femoral, femoral and popliteal veins. COMPARISON:  None Available. FINDINGS: Contralateral Common Femoral Vein: Respiratory phasicity is normal and symmetric with the symptomatic side. No evidence of thrombus. Normal compressibility. Common Femoral Vein: No evidence of thrombus. Normal compressibility, respiratory phasicity and response to augmentation. Saphenofemoral Junction: No evidence of thrombus. Normal compressibility and flow on color Doppler imaging. Profunda Femoral Vein: No evidence of thrombus. Normal compressibility and flow on color Doppler imaging. Femoral Vein: No evidence of thrombus. Normal compressibility, respiratory phasicity and response to augmentation. Popliteal Vein: No evidence of thrombus. Normal compressibility, respiratory phasicity and response to augmentation. Calf Veins: Calf veins not visualized Superficial Great Saphenous Vein: No evidence of thrombus. Normal compressibility and flow on color Doppler imaging. Other Findings:  Edema IMPRESSION: Directed duplex of the left lower extremity negative for DVT Signed, Yvone Neu. Miachel Roux, RPVI [facility] Radiology Electronically Signed   By: Gilmer Mor D.O.   On: 03/01/2023 15:54     Patient Profile     82 y.o. female with a hx of CAD s/p 3v CABG, HTN, HLD who is being seen 03/02/2023 for the evaluation of chest pain.   Assessment & Plan    NSTEMI CAD s/p 3v CABG '05 -- presented with chest pressure and dyspnea. hsTn 509>>519>>628. EKG showed slight ST depression in anterolateral leads -- planned for cardiac cath today -- remains on IV heparin, ASA, statin, coreg, Dilt -- echo pending  Informed Consent   Shared Decision Making/Informed Consent The risks [stroke (1 in 1000), death  (1 in 1000), kidney failure [usually temporary] (1 in 500), bleeding (1 in 200), allergic reaction [possibly serious] (1 in 200)], benefits (diagnostic support and management of coronary artery disease) and alternatives of a cardiac catheterization were discussed in detail with Ms. Jarold Motto and she is willing to proceed.  HTN -- elevated overnight, improved this morning -- continue coreg 6.25mg  BID, Dilt 300mg  daily  Paroxsymal atrial fibrillation -- echo 02/2022 with severe bi-atrial enlargement -- currently in sinus rhythm -- last dose of Eliquis was yesterday morning -- currently on IV heparin  Moderate AS Mild to moderate MR Moderate TR -- echo 02/2022 with moderate AS with AVA 1.07, mean gradient -- repeat echo pending  IDA -- receives IV iron outpatient -- Hgb 9>>8.6 -- no  reported bleeding  Per Primary Hx of endometrial CA Partial colectomy and colostomy Prior DVT Hypothyroidism  For questions or updates, please contact [facility] Please consult [email or web address] for contact info under        Signed, Laverda Page, NP  03/02/2023, 9:48 AM

## 2023-03-02 NOTE — H&P (View-Only) (Signed)
Rounding Note    Patient Name: Shannon Scott Date of Encounter: 03/02/2023  [facility] Cardiologist: Chrystie Nose, MD   Subjective   Feeling well this morning. No chest pain.   Inpatient Medications    Scheduled Meds:  aspirin  81 mg Oral Daily   carvedilol  6.25 mg Oral BID WC   Chlorhexidine Gluconate Cloth  6 each Topical Daily   diltiazem  300 mg Oral QHS   ezetimibe  10 mg Oral Daily   levothyroxine  50 mcg Oral QAC breakfast   potassium chloride  40 mEq Oral Q4H   sodium chloride flush  10-40 mL Intracatheter Q12H   Continuous Infusions:  heparin 600 Units/hr (03/02/23 0609)   iron sucrose     PRN Meds: acetaminophen, polyethylene glycol, traMADol   Vital Signs    Vitals:   03/01/23 2221 03/02/23 0004 03/02/23 0447 03/02/23 0728  BP: (!) 192/63 (!) 192/74 (!) 137/47 (!) 145/56  Pulse: 80 83 64 62  Resp: 20 20 18 18   Temp: 98.3 F (36.8 C) 97.6 F (36.4 C) 98.2 F (36.8 C) 97.8 F (36.6 C)  TempSrc: Oral Oral Oral Oral  SpO2: 100% 99% 93% 94%  Weight: 66.6 kg  65.8 kg   Height: 5\' 1"  (1.549 m)       Intake/Output Summary (Last 24 hours) at 03/02/2023 0948 Last data filed at 03/02/2023 0448 Gross per 24 hour  Intake --  Output 1800 ml  Net -1800 ml      03/02/2023    4:47 AM 03/01/2023   10:21 PM 03/01/2023    5:39 PM  Last 3 Weights  Weight (lbs) 145 lb 146 lb 14.4 oz 154 lb 5.2 oz  Weight (kg) 65.772 kg 66.633 kg 70 kg      Telemetry    Sinus Bradycardia - Personally Reviewed  Physical Exam   GEN: No acute distress.   Neck: No JVD Cardiac: RRR, 3/6 systolic murmur, no rubs, or gallops.  Respiratory: Clear to auscultation bilaterally. GI: Soft, nontender, non-distended  MS: No edema; No deformity. Neuro:  Nonfocal  Psych: Normal affect   Labs    High Sensitivity Troponin:   Recent Labs  Lab 03/01/23 1428 03/01/23 1703 03/02/23 0023  TROPONINIHS 509* 519* 628*     Chemistry Recent Labs  Lab  03/01/23 1210 03/02/23 0340  NA 140 142  K 3.8 3.1*  CL 104 104  CO2 27 24  GLUCOSE 101* 106*  BUN 11 8  CREATININE 0.82 0.71  CALCIUM 8.9 8.9  MG  --  1.8  PROT 6.8  --   ALBUMIN 3.8  --   AST 13*  --   ALT 5  --   ALKPHOS 80  --   BILITOT 0.3  --   GFRNONAA >60 >60  ANIONGAP 9 14    Lipids  Recent Labs  Lab 03/02/23 0340  CHOL 194  TRIG 60  HDL 55  LDLCALC 127*  CHOLHDL 3.5    Hematology Recent Labs  Lab 03/01/23 1210 03/02/23 0340  WBC 4.6 5.0  RBC 3.44*  3.45* 3.34*  HGB 9.0* 8.6*  HCT 29.0* 27.7*  MCV 84.3 82.9  MCH 26.2 25.7*  MCHC 31.0 31.0  RDW 15.6* 15.5  PLT 277 283   Thyroid No results for input(s): "TSH", "FREET4" in the last 168 hours.  BNPNo results for input(s): "BNP", "PROBNP" in the last 168 hours.  DDimer  Recent Labs  Lab 03/01/23 1428  DDIMER 0.78*     Radiology    DG Chest Port 1 View  Result Date: 03/01/2023 CLINICAL DATA:  Right lower extremity edema. EXAM: PORTABLE CHEST 1 VIEW COMPARISON:  Chest radiograph dated March 01, 2022. FINDINGS: The heart size and mediastinal contours are within normal limits. Stable right internal jugular Port-A-Cath. Postsurgical changes related to prior CABG. No focal consolidation, pneumothorax, or sizable pleural effusion. No acute osseous abnormality. IMPRESSION: No active cardiopulmonary disease. Electronically Signed   By: Hart Robinsons M.D.   On: 03/01/2023 16:13   US Venous Img Lower  Left (DVT Study)  Result Date: 03/01/2023 CLINICAL DATA:  82 year old female with edema and swelling EXAM: LEFT LOWER EXTREMITY VENOUS DOPPLER ULTRASOUND TECHNIQUE: Gray-scale sonography with graded compression, as well as color Doppler and duplex ultrasound were performed to evaluate the lower extremity deep venous systems from the level of the common femoral vein and including the common femoral, femoral, profunda femoral, popliteal and calf veins including the posterior tibial, peroneal and gastrocnemius  veins when visible. The superficial great saphenous vein was also interrogated. Spectral Doppler was utilized to evaluate flow at rest and with distal augmentation maneuvers in the common femoral, femoral and popliteal veins. COMPARISON:  None Available. FINDINGS: Contralateral Common Femoral Vein: Respiratory phasicity is normal and symmetric with the symptomatic side. No evidence of thrombus. Normal compressibility. Common Femoral Vein: No evidence of thrombus. Normal compressibility, respiratory phasicity and response to augmentation. Saphenofemoral Junction: No evidence of thrombus. Normal compressibility and flow on color Doppler imaging. Profunda Femoral Vein: No evidence of thrombus. Normal compressibility and flow on color Doppler imaging. Femoral Vein: No evidence of thrombus. Normal compressibility, respiratory phasicity and response to augmentation. Popliteal Vein: No evidence of thrombus. Normal compressibility, respiratory phasicity and response to augmentation. Calf Veins: Calf veins not visualized Superficial Great Saphenous Vein: No evidence of thrombus. Normal compressibility and flow on color Doppler imaging. Other Findings:  Edema IMPRESSION: Directed duplex of the left lower extremity negative for DVT Signed, Yvone Neu. Miachel Roux, RPVI [facility] Radiology Electronically Signed   By: Gilmer Mor D.O.   On: 03/01/2023 15:54     Patient Profile     82 y.o. female with a hx of CAD s/p 3v CABG, HTN, HLD who is being seen 03/02/2023 for the evaluation of chest pain.   Assessment & Plan    NSTEMI CAD s/p 3v CABG '05 -- presented with chest pressure and dyspnea. hsTn 509>>519>>628. EKG showed slight ST depression in anterolateral leads -- planned for cardiac cath today -- remains on IV heparin, ASA, statin, coreg, Dilt -- echo pending  Informed Consent   Shared Decision Making/Informed Consent The risks [stroke (1 in 1000), death  (1 in 1000), kidney failure [usually temporary] (1 in 500), bleeding (1 in 200), allergic reaction [possibly serious] (1 in 200)], benefits (diagnostic support and management of coronary artery disease) and alternatives of a cardiac catheterization were discussed in detail with Ms. Jarold Motto and she is willing to proceed.  HTN -- elevated overnight, improved this morning -- continue coreg 6.25mg  BID, Dilt 300mg  daily  Paroxsymal atrial fibrillation -- echo 02/2022 with severe bi-atrial enlargement -- currently in sinus rhythm -- last dose of Eliquis was yesterday morning -- currently on IV heparin  Moderate AS Mild to moderate MR Moderate TR -- echo 02/2022 with moderate AS with AVA 1.07, mean gradient -- repeat echo pending  IDA -- receives IV iron outpatient -- Hgb 9>>8.6 -- no  reported bleeding  Per Primary Hx of endometrial CA Partial colectomy and colostomy Prior DVT Hypothyroidism  For questions or updates, please contact [facility] Please consult [email or web address] for contact info under        Signed, Laverda Page, NP  03/02/2023, 9:48 AM

## 2023-03-02 NOTE — CV Procedure (Signed)
Left Heart Catheterization 03/02/23:  LV not performed LM severely diseased calcified with a diffuse 50% stenosis. LAD: Proximal LAD 50% narrowed.  Diffusely diseased proximal LAD, gives origin to a very large D1, again diffusely diseased.  Mid LAD has a 95% stenosis.  Mid to distal LAD supplied by LIMA to LAD. LCx: Occluded in the midsegment.  Distal CX supplied by SVG to OM-2.  A moderate-sized OM1 is patent.  The circumflex in the midsegment prior to occlusion is aneurysmal. RCA: Occluded in the midsegment.  Distal RCA supplied by SVG to PDA. LIMA to LAD: Widely patent. SVG to RCA: It is widely patent.  There is about evident in the mid segment.  Supplies a very large RCA. SVG to OM 2: Severely aneurysmal and multiple areas with contrast swirling.  Distal circumflex is still widely patent.  Very large vessel.  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  Not sure she will be a surgical candidate for redo given her age and comorbidity.  Could look into placing cold stent which appears to be amenable.  Will discuss with interventional cardiology team.

## 2023-03-02 NOTE — Plan of Care (Signed)
  Problem: Activity: Goal: Ability to tolerate increased activity will improve Outcome: Progressing   Problem: Cardiac: Goal: Ability to achieve and maintain adequate cardiovascular perfusion will improve Outcome: Progressing   Problem: Education: Goal: Knowledge of General Education information will improve Description: Including pain rating scale, medication(s)/side effects and non-pharmacologic comfort measures Outcome: Progressing   Problem: Health Behavior/Discharge Planning: Goal: Ability to manage health-related needs will improve Outcome: Progressing   Problem: Clinical Measurements: Goal: Will remain free from infection Outcome: Progressing Goal: Respiratory complications will improve Outcome: Progressing Goal: Cardiovascular complication will be avoided Outcome: Progressing   Problem: Activity: Goal: Risk for activity intolerance will decrease Outcome: Progressing   Problem: Coping: Goal: Level of anxiety will decrease Outcome: Progressing   Problem: Elimination: Goal: Will not experience complications related to bowel motility Outcome: Progressing Goal: Will not experience complications related to urinary retention Outcome: Progressing   Problem: Pain Managment: Goal: General experience of comfort will improve Outcome: Progressing   Problem: Safety: Goal: Ability to remain free from injury will improve Outcome: Progressing   Problem: Skin Integrity: Goal: Risk for impaired skin integrity will decrease Outcome: Progressing   Problem: Clinical Measurements: Goal: Ability to maintain clinical measurements within normal limits will improve Outcome: Not Progressing Goal: Diagnostic test results will improve Outcome: Not Progressing   Problem: Nutrition: Goal: Adequate nutrition will be maintained Outcome: Not Progressing

## 2023-03-02 NOTE — Plan of Care (Signed)
  Problem: Education: Goal: Understanding of cardiac disease, CV risk reduction, and recovery process will improve Outcome: Progressing Goal: Individualized Educational Video(s) Outcome: Progressing   Problem: Activity: Goal: Ability to tolerate increased activity will improve Outcome: Progressing   Problem: Cardiac: Goal: Ability to achieve and maintain adequate cardiovascular perfusion will improve Outcome: Progressing   Problem: Education: Goal: Knowledge of General Education information will improve Description: Including pain rating scale, medication(s)/side effects and non-pharmacologic comfort measures Outcome: Progressing   Problem: Health Behavior/Discharge Planning: Goal: Ability to manage health-related needs will improve Outcome: Progressing

## 2023-03-03 ENCOUNTER — Inpatient Hospital Stay (HOSPITAL_COMMUNITY): Payer: Medicare Other

## 2023-03-03 ENCOUNTER — Encounter (HOSPITAL_COMMUNITY): Payer: Self-pay | Admitting: Cardiology

## 2023-03-03 DIAGNOSIS — I350 Nonrheumatic aortic (valve) stenosis: Secondary | ICD-10-CM

## 2023-03-03 DIAGNOSIS — I214 Non-ST elevation (NSTEMI) myocardial infarction: Secondary | ICD-10-CM | POA: Diagnosis not present

## 2023-03-03 LAB — CBC WITH DIFFERENTIAL/PLATELET
Abs Immature Granulocytes: 0.02 10*3/uL (ref 0.00–0.07)
Basophils Absolute: 0 10*3/uL (ref 0.0–0.1)
Basophils Relative: 0 %
Eosinophils Absolute: 0.1 10*3/uL (ref 0.0–0.5)
Eosinophils Relative: 1 %
HCT: 29.2 % — ABNORMAL LOW (ref 36.0–46.0)
Hemoglobin: 8.9 g/dL — ABNORMAL LOW (ref 12.0–15.0)
Immature Granulocytes: 0 %
Lymphocytes Relative: 15 %
Lymphs Abs: 1.1 10*3/uL (ref 0.7–4.0)
MCH: 25.4 pg — ABNORMAL LOW (ref 26.0–34.0)
MCHC: 30.5 g/dL (ref 30.0–36.0)
MCV: 83.2 fL (ref 80.0–100.0)
Monocytes Absolute: 0.5 10*3/uL (ref 0.1–1.0)
Monocytes Relative: 7 %
Neutro Abs: 5.5 10*3/uL (ref 1.7–7.7)
Neutrophils Relative %: 77 %
Platelets: 304 10*3/uL (ref 150–400)
RBC: 3.51 MIL/uL — ABNORMAL LOW (ref 3.87–5.11)
RDW: 15.8 % — ABNORMAL HIGH (ref 11.5–15.5)
WBC: 7.2 10*3/uL (ref 4.0–10.5)
nRBC: 0 % (ref 0.0–0.2)

## 2023-03-03 LAB — BASIC METABOLIC PANEL
Anion gap: 6 (ref 5–15)
BUN: 7 mg/dL — ABNORMAL LOW (ref 8–23)
CO2: 24 mmol/L (ref 22–32)
Calcium: 8.4 mg/dL — ABNORMAL LOW (ref 8.9–10.3)
Chloride: 106 mmol/L (ref 98–111)
Creatinine, Ser: 0.94 mg/dL (ref 0.44–1.00)
GFR, Estimated: >60 mL/min (ref >60)
Glucose, Bld: 112 mg/dL — ABNORMAL HIGH (ref 70–99)
Potassium: 4 mmol/L (ref 3.5–5.1)
Sodium: 136 mmol/L (ref 135–145)

## 2023-03-03 LAB — ECHOCARDIOGRAM COMPLETE
AR max vel: 0.78 cm2
AV Area VTI: 0.79 cm2
AV Area mean vel: 0.72 cm2
AV Mean grad: 24 mm[Hg]
AV Peak grad: 32.3 mm[Hg]
Ao pk vel: 2.84 m/s
Area-P 1/2: 3.42 cm2
Calc EF: 63.3 %
Height: 61 in
MV M vel: 5.04 m/s
MV Peak grad: 101.6 mm[Hg]
MV VTI: 1.55 cm2
P 1/2 time: 436 ms
S' Lateral: 2.7 cm
Single Plane A2C EF: 65.1 %
Single Plane A4C EF: 61.5 %
Weight: 2366.86 [oz_av]

## 2023-03-03 LAB — PREPARE RBC (CROSSMATCH)

## 2023-03-03 LAB — MAGNESIUM: Magnesium: 2.1 mg/dL (ref 1.7–2.4)

## 2023-03-03 MED ORDER — APIXABAN 5 MG PO TABS
5.0000 mg | ORAL_TABLET | Freq: Two times a day (BID) | ORAL | Status: DC
  Administered 2023-03-03 – 2023-03-04 (×3): 5 mg via ORAL
  Filled 2023-03-03 (×3): qty 1

## 2023-03-03 MED ORDER — FUROSEMIDE 10 MG/ML IJ SOLN
30.0000 mg | Freq: Once | INTRAMUSCULAR | Status: AC
  Administered 2023-03-03: 30 mg via INTRAVENOUS
  Filled 2023-03-03: qty 4

## 2023-03-03 MED ORDER — SODIUM CHLORIDE 0.9% IV SOLUTION: Freq: Once | INTRAVENOUS | Status: AC

## 2023-03-03 NOTE — Consult Note (Addendum)
Dominant, occluded in the midsegment.  Diffusely diseased proximally.  LIMA to LAD: Widely patent. SVG to RCA: Patent with tandem 70% stenosis, mild ectasia noted in the midsegment of the body of the saphenous vein graft.  TIMI-3 flow is evident. 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   The aneurysms extend throughout the mid and distal segment of the graft probably aneurysmal length is about 60 to 80 mm.   Recommendation: Patient is very frail 82 year old patient with severe anemia, would recommend outpatient GI evaluation to exclude any obvious GI source of bleeding.  Continue Eliquis for now and also consider adding 81 mg of aspirin already on Plavix only if cleared by GI.  I will discuss with my interventional cardiology colleagues regarding management options but given her frail nature, severe anemia, long aneurysmal segment and risk involving the SVG to OM 2 which is probably the culprit for her NSTEMI will be very difficult to treat with covered stents requiring long-term DAPT and attendant risk of bleeding complications.  Probably medical therapy for now and can be discharged with outpatient follow-up.  Findings Coronary Findings Diagnostic  Dominance: Right  Left Main Ost LM to Prox LAD lesion is 60% stenosed.  Left Anterior Descending Mid LAD lesion is 95% stenosed.  Left Circumflex Mid Cx lesion is 100% stenosed.  Right Coronary Artery Prox RCA to Mid RCA lesion is 100% stenosed.  Graft To 2nd Mrg Prox Graft lesion is 95% stenosed. Non-stenotic Prox Graft to Dist Graft lesion.  LIMA Graft To Dist LAD  Graft To Dist RCA Prox Graft lesion is 70% stenosed. Mid Graft lesion is 70% stenosed.  Intervention  No interventions have been documented.   STRESS TESTS  NM MYOCAR MULTI W/SPECT W 04/24/2012   ECHOCARDIOGRAM  ECHOCARDIOGRAM COMPLETE 03/03/2023  Narrative ECHOCARDIOGRAM REPORT    Patient Name:   Shannon Scott Date of Exam: 03/03/2023 Medical Rec #:  [id number]         Height:       61.0 in Accession #:    [id number]        Weight:       147.9 lb Date of Birth:  1940-08-20          BSA:          1.662 m Patient Age:    82 years          BP:           130/54 mmHg Patient Gender: F                 HR:           65 bpm. Exam Location:   Inpatient  Procedure: 2D Echo, Cardiac Doppler and Color Doppler  Indications:    NSTEMI Aortic stenosis  History:        Patient has prior history of Echocardiogram examinations, most recent 03/02/2022. CAD, Prior CABG, Carotid Disease and Stroke, Aortic Valve Disease, Arrythmias:Atrial Fibrillation; Risk Factors:Hypertension and Dyslipidemia. Cancer.  Sonographer:    Milda Smart Referring Phys: [id number] JONATHAN SEGARS   Sonographer Comments: Image acquisition challenging due to patient body habitus and Image acquisition challenging due to respiratory motion. IMPRESSIONS   1. Left ventricular ejection fraction, by estimation, is 55 to 60%. The left ventricle has normal function. The left ventricle has no regional wall motion  Dominant, occluded in the midsegment.  Diffusely diseased proximally.  LIMA to LAD: Widely patent. SVG to RCA: Patent with tandem 70% stenosis, mild ectasia noted in the midsegment of the body of the saphenous vein graft.  TIMI-3 flow is evident. 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   The aneurysms extend throughout the mid and distal segment of the graft probably aneurysmal length is about 60 to 80 mm.   Recommendation: Patient is very frail 82 year old patient with severe anemia, would recommend outpatient GI evaluation to exclude any obvious GI source of bleeding.  Continue Eliquis for now and also consider adding 81 mg of aspirin already on Plavix only if cleared by GI.  I will discuss with my interventional cardiology colleagues regarding management options but given her frail nature, severe anemia, long aneurysmal segment and risk involving the SVG to OM 2 which is probably the culprit for her NSTEMI will be very difficult to treat with covered stents requiring long-term DAPT and attendant risk of bleeding complications.  Probably medical therapy for now and can be discharged with outpatient follow-up.  Findings Coronary Findings Diagnostic  Dominance: Right  Left Main Ost LM to Prox LAD lesion is 60% stenosed.  Left Anterior Descending Mid LAD lesion is 95% stenosed.  Left Circumflex Mid Cx lesion is 100% stenosed.  Right Coronary Artery Prox RCA to Mid RCA lesion is 100% stenosed.  Graft To 2nd Mrg Prox Graft lesion is 95% stenosed. Non-stenotic Prox Graft to Dist Graft lesion.  LIMA Graft To Dist LAD  Graft To Dist RCA Prox Graft lesion is 70% stenosed. Mid Graft lesion is 70% stenosed.  Intervention  No interventions have been documented.   STRESS TESTS  NM MYOCAR MULTI W/SPECT W 04/24/2012   ECHOCARDIOGRAM  ECHOCARDIOGRAM COMPLETE 03/03/2023  Narrative ECHOCARDIOGRAM REPORT    Patient Name:   Shannon Scott Date of Exam: 03/03/2023 Medical Rec #:  [id number]         Height:       61.0 in Accession #:    [id number]        Weight:       147.9 lb Date of Birth:  1940-08-20          BSA:          1.662 m Patient Age:    82 years          BP:           130/54 mmHg Patient Gender: F                 HR:           65 bpm. Exam Location:   Inpatient  Procedure: 2D Echo, Cardiac Doppler and Color Doppler  Indications:    NSTEMI Aortic stenosis  History:        Patient has prior history of Echocardiogram examinations, most recent 03/02/2022. CAD, Prior CABG, Carotid Disease and Stroke, Aortic Valve Disease, Arrythmias:Atrial Fibrillation; Risk Factors:Hypertension and Dyslipidemia. Cancer.  Sonographer:    Milda Smart Referring Phys: [id number] JONATHAN SEGARS   Sonographer Comments: Image acquisition challenging due to patient body habitus and Image acquisition challenging due to respiratory motion. IMPRESSIONS   1. Left ventricular ejection fraction, by estimation, is 55 to 60%. The left ventricle has normal function. The left ventricle has no regional wall motion  K 3.8 3.1* 4.0  CL 104 104 106  CO2 27 24 24   GLUCOSE 101* 106* 112*  BUN 11 8 7*  CREATININE 0.82 0.71 0.94  CALCIUM 8.9 8.9 8.4*  GFRNONAA >60 >60 >60  ANIONGAP 9 14 6     Recent Labs  Lab 03/01/23 1210  PROT 6.8  ALBUMIN 3.8  AST 13*  ALT 5  ALKPHOS 80  BILITOT 0.3   Hematology Recent Labs  Lab 03/01/23 1210 03/02/23 0340 03/03/23 0626  WBC 4.6 5.0 7.2  RBC 3.44*  3.45* 3.34* 3.51*  HGB 9.0* 8.6* 8.9*  HCT 29.0* 27.7* 29.2*  MCV 84.3 82.9 83.2  MCH 26.2 25.7* 25.4*  MCHC 31.0 31.0 30.5  RDW 15.6* 15.5 15.8*  PLT 277 283 304   BNPNo results for input(s): "BNP", "PROBNP" in the last 168 hours.  DDimer  Recent Labs  Lab 03/01/23 1428  DDIMER 0.78*   Radiology/Studies:  ECHOCARDIOGRAM COMPLETE  Result Date: 03/03/2023    ECHOCARDIOGRAM REPORT   Patient Name:   Shannon Scott Date of Exam: 03/03/2023 Medical Rec #:  [id number]         Height:       61.0 in Accession #:    [id number]        Weight:       147.9 lb Date of Birth:  [birth year]          BSA:          1.662 m Patient Age:    82 years          BP:           130/54 mmHg Patient Gender: F                 HR:           65 bpm. Exam Location:  Inpatient Procedure: 2D Echo, Cardiac Doppler and Color Doppler Indications:    NSTEMI                 Aortic stenosis  History:        Patient has prior history of Echocardiogram examinations, most                 recent 03/02/2022. CAD, Prior CABG, Carotid Disease and  Stroke,                 Aortic Valve Disease, Arrythmias:Atrial Fibrillation; Risk                 Factors:Hypertension and Dyslipidemia. Cancer.  Sonographer:    Milda Smart Referring Phys: [id number] JONATHAN SEGARS  Sonographer Comments: Image acquisition challenging due to patient body habitus and Image acquisition challenging due to respiratory motion. IMPRESSIONS  1. Left ventricular ejection fraction, by estimation, is 55 to 60%. The left ventricle has normal function. The left ventricle has no regional wall motion abnormalities. There is mild concentric left ventricular hypertrophy. Left ventricular diastolic parameters are consistent with Grade II diastolic dysfunction (pseudonormalization).  2. Right ventricular systolic function is low normal. The right ventricular size is normal. There is normal pulmonary artery systolic pressure. The estimated right ventricular systolic pressure is 24.7 mmHg.  3. Left atrial size was mildly dilated.  4. Right atrial size was mildly dilated.  5. The mitral valve is degenerative. Mild to moderate mitral valve regurgitation. No evidence of mitral stenosis. Moderate to severe mitral annular calcification.  6. Tricuspid valve regurgitation is moderate.  7. The aortic valve is tricuspid. There is  K 3.8 3.1* 4.0  CL 104 104 106  CO2 27 24 24   GLUCOSE 101* 106* 112*  BUN 11 8 7*  CREATININE 0.82 0.71 0.94  CALCIUM 8.9 8.9 8.4*  GFRNONAA >60 >60 >60  ANIONGAP 9 14 6     Recent Labs  Lab 03/01/23 1210  PROT 6.8  ALBUMIN 3.8  AST 13*  ALT 5  ALKPHOS 80  BILITOT 0.3   Hematology Recent Labs  Lab 03/01/23 1210 03/02/23 0340 03/03/23 0626  WBC 4.6 5.0 7.2  RBC 3.44*  3.45* 3.34* 3.51*  HGB 9.0* 8.6* 8.9*  HCT 29.0* 27.7* 29.2*  MCV 84.3 82.9 83.2  MCH 26.2 25.7* 25.4*  MCHC 31.0 31.0 30.5  RDW 15.6* 15.5 15.8*  PLT 277 283 304   BNPNo results for input(s): "BNP", "PROBNP" in the last 168 hours.  DDimer  Recent Labs  Lab 03/01/23 1428  DDIMER 0.78*   Radiology/Studies:  ECHOCARDIOGRAM COMPLETE  Result Date: 03/03/2023    ECHOCARDIOGRAM REPORT   Patient Name:   Shannon Scott Date of Exam: 03/03/2023 Medical Rec #:  [id number]         Height:       61.0 in Accession #:    [id number]        Weight:       147.9 lb Date of Birth:  [birth year]          BSA:          1.662 m Patient Age:    82 years          BP:           130/54 mmHg Patient Gender: F                 HR:           65 bpm. Exam Location:  Inpatient Procedure: 2D Echo, Cardiac Doppler and Color Doppler Indications:    NSTEMI                 Aortic stenosis  History:        Patient has prior history of Echocardiogram examinations, most                 recent 03/02/2022. CAD, Prior CABG, Carotid Disease and  Stroke,                 Aortic Valve Disease, Arrythmias:Atrial Fibrillation; Risk                 Factors:Hypertension and Dyslipidemia. Cancer.  Sonographer:    Milda Smart Referring Phys: [id number] JONATHAN SEGARS  Sonographer Comments: Image acquisition challenging due to patient body habitus and Image acquisition challenging due to respiratory motion. IMPRESSIONS  1. Left ventricular ejection fraction, by estimation, is 55 to 60%. The left ventricle has normal function. The left ventricle has no regional wall motion abnormalities. There is mild concentric left ventricular hypertrophy. Left ventricular diastolic parameters are consistent with Grade II diastolic dysfunction (pseudonormalization).  2. Right ventricular systolic function is low normal. The right ventricular size is normal. There is normal pulmonary artery systolic pressure. The estimated right ventricular systolic pressure is 24.7 mmHg.  3. Left atrial size was mildly dilated.  4. Right atrial size was mildly dilated.  5. The mitral valve is degenerative. Mild to moderate mitral valve regurgitation. No evidence of mitral stenosis. Moderate to severe mitral annular calcification.  6. Tricuspid valve regurgitation is moderate.  7. The aortic valve is tricuspid. There is  K 3.8 3.1* 4.0  CL 104 104 106  CO2 27 24 24   GLUCOSE 101* 106* 112*  BUN 11 8 7*  CREATININE 0.82 0.71 0.94  CALCIUM 8.9 8.9 8.4*  GFRNONAA >60 >60 >60  ANIONGAP 9 14 6     Recent Labs  Lab 03/01/23 1210  PROT 6.8  ALBUMIN 3.8  AST 13*  ALT 5  ALKPHOS 80  BILITOT 0.3   Hematology Recent Labs  Lab 03/01/23 1210 03/02/23 0340 03/03/23 0626  WBC 4.6 5.0 7.2  RBC 3.44*  3.45* 3.34* 3.51*  HGB 9.0* 8.6* 8.9*  HCT 29.0* 27.7* 29.2*  MCV 84.3 82.9 83.2  MCH 26.2 25.7* 25.4*  MCHC 31.0 31.0 30.5  RDW 15.6* 15.5 15.8*  PLT 277 283 304   BNPNo results for input(s): "BNP", "PROBNP" in the last 168 hours.  DDimer  Recent Labs  Lab 03/01/23 1428  DDIMER 0.78*   Radiology/Studies:  ECHOCARDIOGRAM COMPLETE  Result Date: 03/03/2023    ECHOCARDIOGRAM REPORT   Patient Name:   Shannon Scott Date of Exam: 03/03/2023 Medical Rec #:  [id number]         Height:       61.0 in Accession #:    [id number]        Weight:       147.9 lb Date of Birth:  [birth year]          BSA:          1.662 m Patient Age:    82 years          BP:           130/54 mmHg Patient Gender: F                 HR:           65 bpm. Exam Location:  Inpatient Procedure: 2D Echo, Cardiac Doppler and Color Doppler Indications:    NSTEMI                 Aortic stenosis  History:        Patient has prior history of Echocardiogram examinations, most                 recent 03/02/2022. CAD, Prior CABG, Carotid Disease and  Stroke,                 Aortic Valve Disease, Arrythmias:Atrial Fibrillation; Risk                 Factors:Hypertension and Dyslipidemia. Cancer.  Sonographer:    Milda Smart Referring Phys: [id number] JONATHAN SEGARS  Sonographer Comments: Image acquisition challenging due to patient body habitus and Image acquisition challenging due to respiratory motion. IMPRESSIONS  1. Left ventricular ejection fraction, by estimation, is 55 to 60%. The left ventricle has normal function. The left ventricle has no regional wall motion abnormalities. There is mild concentric left ventricular hypertrophy. Left ventricular diastolic parameters are consistent with Grade II diastolic dysfunction (pseudonormalization).  2. Right ventricular systolic function is low normal. The right ventricular size is normal. There is normal pulmonary artery systolic pressure. The estimated right ventricular systolic pressure is 24.7 mmHg.  3. Left atrial size was mildly dilated.  4. Right atrial size was mildly dilated.  5. The mitral valve is degenerative. Mild to moderate mitral valve regurgitation. No evidence of mitral stenosis. Moderate to severe mitral annular calcification.  6. Tricuspid valve regurgitation is moderate.  7. The aortic valve is tricuspid. There is  Dominant, occluded in the midsegment.  Diffusely diseased proximally.  LIMA to LAD: Widely patent. SVG to RCA: Patent with tandem 70% stenosis, mild ectasia noted in the midsegment of the body of the saphenous vein graft.  TIMI-3 flow is evident. 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   The aneurysms extend throughout the mid and distal segment of the graft probably aneurysmal length is about 60 to 80 mm.   Recommendation: Patient is very frail 82 year old patient with severe anemia, would recommend outpatient GI evaluation to exclude any obvious GI source of bleeding.  Continue Eliquis for now and also consider adding 81 mg of aspirin already on Plavix only if cleared by GI.  I will discuss with my interventional cardiology colleagues regarding management options but given her frail nature, severe anemia, long aneurysmal segment and risk involving the SVG to OM 2 which is probably the culprit for her NSTEMI will be very difficult to treat with covered stents requiring long-term DAPT and attendant risk of bleeding complications.  Probably medical therapy for now and can be discharged with outpatient follow-up.  Findings Coronary Findings Diagnostic  Dominance: Right  Left Main Ost LM to Prox LAD lesion is 60% stenosed.  Left Anterior Descending Mid LAD lesion is 95% stenosed.  Left Circumflex Mid Cx lesion is 100% stenosed.  Right Coronary Artery Prox RCA to Mid RCA lesion is 100% stenosed.  Graft To 2nd Mrg Prox Graft lesion is 95% stenosed. Non-stenotic Prox Graft to Dist Graft lesion.  LIMA Graft To Dist LAD  Graft To Dist RCA Prox Graft lesion is 70% stenosed. Mid Graft lesion is 70% stenosed.  Intervention  No interventions have been documented.   STRESS TESTS  NM MYOCAR MULTI W/SPECT W 04/24/2012   ECHOCARDIOGRAM  ECHOCARDIOGRAM COMPLETE 03/03/2023  Narrative ECHOCARDIOGRAM REPORT    Patient Name:   Shannon Scott Date of Exam: 03/03/2023 Medical Rec #:  [id number]         Height:       61.0 in Accession #:    [id number]        Weight:       147.9 lb Date of Birth:  1940-08-20          BSA:          1.662 m Patient Age:    82 years          BP:           130/54 mmHg Patient Gender: F                 HR:           65 bpm. Exam Location:   Inpatient  Procedure: 2D Echo, Cardiac Doppler and Color Doppler  Indications:    NSTEMI Aortic stenosis  History:        Patient has prior history of Echocardiogram examinations, most recent 03/02/2022. CAD, Prior CABG, Carotid Disease and Stroke, Aortic Valve Disease, Arrythmias:Atrial Fibrillation; Risk Factors:Hypertension and Dyslipidemia. Cancer.  Sonographer:    Milda Smart Referring Phys: [id number] JONATHAN SEGARS   Sonographer Comments: Image acquisition challenging due to patient body habitus and Image acquisition challenging due to respiratory motion. IMPRESSIONS   1. Left ventricular ejection fraction, by estimation, is 55 to 60%. The left ventricle has normal function. The left ventricle has no regional wall motion  K 3.8 3.1* 4.0  CL 104 104 106  CO2 27 24 24   GLUCOSE 101* 106* 112*  BUN 11 8 7*  CREATININE 0.82 0.71 0.94  CALCIUM 8.9 8.9 8.4*  GFRNONAA >60 >60 >60  ANIONGAP 9 14 6     Recent Labs  Lab 03/01/23 1210  PROT 6.8  ALBUMIN 3.8  AST 13*  ALT 5  ALKPHOS 80  BILITOT 0.3   Hematology Recent Labs  Lab 03/01/23 1210 03/02/23 0340 03/03/23 0626  WBC 4.6 5.0 7.2  RBC 3.44*  3.45* 3.34* 3.51*  HGB 9.0* 8.6* 8.9*  HCT 29.0* 27.7* 29.2*  MCV 84.3 82.9 83.2  MCH 26.2 25.7* 25.4*  MCHC 31.0 31.0 30.5  RDW 15.6* 15.5 15.8*  PLT 277 283 304   BNPNo results for input(s): "BNP", "PROBNP" in the last 168 hours.  DDimer  Recent Labs  Lab 03/01/23 1428  DDIMER 0.78*   Radiology/Studies:  ECHOCARDIOGRAM COMPLETE  Result Date: 03/03/2023    ECHOCARDIOGRAM REPORT   Patient Name:   Shannon Scott Date of Exam: 03/03/2023 Medical Rec #:  [id number]         Height:       61.0 in Accession #:    [id number]        Weight:       147.9 lb Date of Birth:  [birth year]          BSA:          1.662 m Patient Age:    82 years          BP:           130/54 mmHg Patient Gender: F                 HR:           65 bpm. Exam Location:  Inpatient Procedure: 2D Echo, Cardiac Doppler and Color Doppler Indications:    NSTEMI                 Aortic stenosis  History:        Patient has prior history of Echocardiogram examinations, most                 recent 03/02/2022. CAD, Prior CABG, Carotid Disease and  Stroke,                 Aortic Valve Disease, Arrythmias:Atrial Fibrillation; Risk                 Factors:Hypertension and Dyslipidemia. Cancer.  Sonographer:    Milda Smart Referring Phys: [id number] JONATHAN SEGARS  Sonographer Comments: Image acquisition challenging due to patient body habitus and Image acquisition challenging due to respiratory motion. IMPRESSIONS  1. Left ventricular ejection fraction, by estimation, is 55 to 60%. The left ventricle has normal function. The left ventricle has no regional wall motion abnormalities. There is mild concentric left ventricular hypertrophy. Left ventricular diastolic parameters are consistent with Grade II diastolic dysfunction (pseudonormalization).  2. Right ventricular systolic function is low normal. The right ventricular size is normal. There is normal pulmonary artery systolic pressure. The estimated right ventricular systolic pressure is 24.7 mmHg.  3. Left atrial size was mildly dilated.  4. Right atrial size was mildly dilated.  5. The mitral valve is degenerative. Mild to moderate mitral valve regurgitation. No evidence of mitral stenosis. Moderate to severe mitral annular calcification.  6. Tricuspid valve regurgitation is moderate.  7. The aortic valve is tricuspid. There is  [facility]   MULTIDISCIPLINARY HEART VALVE TEAM  Cardiology Consultation:   Patient ID: Shannon Scott MRN: [id number]; DOB: 1941/01/03  Admit date: 03/01/2023 Date of Consult: 03/03/2023  Primary Care Provider: [name], MD [facility] Cardiologist: Chrystie Nose, MD   Patient Profile:   Shannon Scott is a 82 y.o. female with a hx of CAD s/p CABG x3 2005, PAF on Eliquis, hx of CVA, carotid artery disease with LICA 60-79%/RICA 40-59%/[place] >50% from 2016, HLD with statin intolerance, HTN, prior DVT, chronic LL lymphedema, endometrial cancer with mets to colon 2019 s/p chemoradiation/immunotherapy/hysterectomy BSO, colonic mass with partial colectomy/colostomy 2016, and previous moderate aortic stenosis who is being seen today for the evaluation of possible TAVR due to progression of AS at the request of Dr. Allyson Sabal.  History of Present Illness:   Ms. Myers is a very pleasant 82yo female who lives alone here in [place]. She has one son who is very supportive and is at her bedside. Although she does not drive due to hx of CVA, she is able to care for herself and complete normal home tasks with no difficulty. She ambulates at time with a walker for safety precautions given stroke history as well. She follows with a dentist on a regular basis with no active dental issues.   Ms. Schwan is followed by Dr. Rennis Golden for her cardiology care. Per chart review, she has a distant history of CAD and had a CABG in 2005 with LIMA to LAD, SVG to circumflex, and SVG to PDA.  Her most recent ischemic evaluation was a stress test in 2013 that was negative for ischemia. She also has a history of carotid artery disease, underwent carotid Dopplers in 2016 that demonstrated 40% to 59% right internal carotid stenosis and 60% to 79% left internal carotid stenosis with no recent dopplers.    In 2021 she was admitted with expressive aphasia, right-sided headache and neck pain.  Work-up revealed a left MCA infarct. She was started on Eliquis for stroke prevention. Echocardiogram at that time showed an EF 60-65%, no regional wall motion abnormalities, mild LVH with grade 2 diastolic dysfunction, normal RV systolic pressure, mildly elevated pulmonary artery systolic pressure, mild mitral valve regurgitation, moderate aortic valve stenosis.   She was seen in follow up 04/2022 at which time she was doing well with no concerns. Given moderate AS found on echo, plan was to update with repeat imaging and follow in 6 months.  Unfortunately she presented to the ED after experiencing chest pain and acute SOB while at an oncology appointment 10/1. HsT found to be elevated with a peak of 628 with EKG changes therefore she was transferred to [facility] with cardiology consultation for NSTEMI. LHC and echo were planned. Patient also has known iron deficiency anemia and receives OP iron infusions with Dr. Myna Hidalgo. Hb found to be  hovering around 8.6 with PLT at 283 and MCV at 83. She was seen by her hematologist who recommended iron and PRBC infusions given cardiac demand.  LHC 10/2 showed diffuse native CAD with patient LIMA to LAD, patent SVG to RCA with tandem 70% stenosis, mild ectasia noted in the midsegment of the body of the saphenous vein graft with TIMI-3 flow, and degenerated SVG to OM2 with a focal 95% stenosis following which there is poststenotic tandem large aneurysmal area to the insertion to the native vessel. Given her frailty and severe anemia no intervention was pursued as PCI and long term DAPT was felt  K 3.8 3.1* 4.0  CL 104 104 106  CO2 27 24 24   GLUCOSE 101* 106* 112*  BUN 11 8 7*  CREATININE 0.82 0.71 0.94  CALCIUM 8.9 8.9 8.4*  GFRNONAA >60 >60 >60  ANIONGAP 9 14 6     Recent Labs  Lab 03/01/23 1210  PROT 6.8  ALBUMIN 3.8  AST 13*  ALT 5  ALKPHOS 80  BILITOT 0.3   Hematology Recent Labs  Lab 03/01/23 1210 03/02/23 0340 03/03/23 0626  WBC 4.6 5.0 7.2  RBC 3.44*  3.45* 3.34* 3.51*  HGB 9.0* 8.6* 8.9*  HCT 29.0* 27.7* 29.2*  MCV 84.3 82.9 83.2  MCH 26.2 25.7* 25.4*  MCHC 31.0 31.0 30.5  RDW 15.6* 15.5 15.8*  PLT 277 283 304   BNPNo results for input(s): "BNP", "PROBNP" in the last 168 hours.  DDimer  Recent Labs  Lab 03/01/23 1428  DDIMER 0.78*   Radiology/Studies:  ECHOCARDIOGRAM COMPLETE  Result Date: 03/03/2023    ECHOCARDIOGRAM REPORT   Patient Name:   Shannon Scott Date of Exam: 03/03/2023 Medical Rec #:  [id number]         Height:       61.0 in Accession #:    [id number]        Weight:       147.9 lb Date of Birth:  [birth year]          BSA:          1.662 m Patient Age:    82 years          BP:           130/54 mmHg Patient Gender: F                 HR:           65 bpm. Exam Location:  Inpatient Procedure: 2D Echo, Cardiac Doppler and Color Doppler Indications:    NSTEMI                 Aortic stenosis  History:        Patient has prior history of Echocardiogram examinations, most                 recent 03/02/2022. CAD, Prior CABG, Carotid Disease and  Stroke,                 Aortic Valve Disease, Arrythmias:Atrial Fibrillation; Risk                 Factors:Hypertension and Dyslipidemia. Cancer.  Sonographer:    Milda Smart Referring Phys: [id number] JONATHAN SEGARS  Sonographer Comments: Image acquisition challenging due to patient body habitus and Image acquisition challenging due to respiratory motion. IMPRESSIONS  1. Left ventricular ejection fraction, by estimation, is 55 to 60%. The left ventricle has normal function. The left ventricle has no regional wall motion abnormalities. There is mild concentric left ventricular hypertrophy. Left ventricular diastolic parameters are consistent with Grade II diastolic dysfunction (pseudonormalization).  2. Right ventricular systolic function is low normal. The right ventricular size is normal. There is normal pulmonary artery systolic pressure. The estimated right ventricular systolic pressure is 24.7 mmHg.  3. Left atrial size was mildly dilated.  4. Right atrial size was mildly dilated.  5. The mitral valve is degenerative. Mild to moderate mitral valve regurgitation. No evidence of mitral stenosis. Moderate to severe mitral annular calcification.  6. Tricuspid valve regurgitation is moderate.  7. The aortic valve is tricuspid. There is  Dominant, occluded in the midsegment.  Diffusely diseased proximally.  LIMA to LAD: Widely patent. SVG to RCA: Patent with tandem 70% stenosis, mild ectasia noted in the midsegment of the body of the saphenous vein graft.  TIMI-3 flow is evident. 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   The aneurysms extend throughout the mid and distal segment of the graft probably aneurysmal length is about 60 to 80 mm.   Recommendation: Patient is very frail 82 year old patient with severe anemia, would recommend outpatient GI evaluation to exclude any obvious GI source of bleeding.  Continue Eliquis for now and also consider adding 81 mg of aspirin already on Plavix only if cleared by GI.  I will discuss with my interventional cardiology colleagues regarding management options but given her frail nature, severe anemia, long aneurysmal segment and risk involving the SVG to OM 2 which is probably the culprit for her NSTEMI will be very difficult to treat with covered stents requiring long-term DAPT and attendant risk of bleeding complications.  Probably medical therapy for now and can be discharged with outpatient follow-up.  Findings Coronary Findings Diagnostic  Dominance: Right  Left Main Ost LM to Prox LAD lesion is 60% stenosed.  Left Anterior Descending Mid LAD lesion is 95% stenosed.  Left Circumflex Mid Cx lesion is 100% stenosed.  Right Coronary Artery Prox RCA to Mid RCA lesion is 100% stenosed.  Graft To 2nd Mrg Prox Graft lesion is 95% stenosed. Non-stenotic Prox Graft to Dist Graft lesion.  LIMA Graft To Dist LAD  Graft To Dist RCA Prox Graft lesion is 70% stenosed. Mid Graft lesion is 70% stenosed.  Intervention  No interventions have been documented.   STRESS TESTS  NM MYOCAR MULTI W/SPECT W 04/24/2012   ECHOCARDIOGRAM  ECHOCARDIOGRAM COMPLETE 03/03/2023  Narrative ECHOCARDIOGRAM REPORT    Patient Name:   Shannon Scott Date of Exam: 03/03/2023 Medical Rec #:  [id number]         Height:       61.0 in Accession #:    [id number]        Weight:       147.9 lb Date of Birth:  1940-08-20          BSA:          1.662 m Patient Age:    82 years          BP:           130/54 mmHg Patient Gender: F                 HR:           65 bpm. Exam Location:   Inpatient  Procedure: 2D Echo, Cardiac Doppler and Color Doppler  Indications:    NSTEMI Aortic stenosis  History:        Patient has prior history of Echocardiogram examinations, most recent 03/02/2022. CAD, Prior CABG, Carotid Disease and Stroke, Aortic Valve Disease, Arrythmias:Atrial Fibrillation; Risk Factors:Hypertension and Dyslipidemia. Cancer.  Sonographer:    Milda Smart Referring Phys: [id number] JONATHAN SEGARS   Sonographer Comments: Image acquisition challenging due to patient body habitus and Image acquisition challenging due to respiratory motion. IMPRESSIONS   1. Left ventricular ejection fraction, by estimation, is 55 to 60%. The left ventricle has normal function. The left ventricle has no regional wall motion  K 3.8 3.1* 4.0  CL 104 104 106  CO2 27 24 24   GLUCOSE 101* 106* 112*  BUN 11 8 7*  CREATININE 0.82 0.71 0.94  CALCIUM 8.9 8.9 8.4*  GFRNONAA >60 >60 >60  ANIONGAP 9 14 6     Recent Labs  Lab 03/01/23 1210  PROT 6.8  ALBUMIN 3.8  AST 13*  ALT 5  ALKPHOS 80  BILITOT 0.3   Hematology Recent Labs  Lab 03/01/23 1210 03/02/23 0340 03/03/23 0626  WBC 4.6 5.0 7.2  RBC 3.44*  3.45* 3.34* 3.51*  HGB 9.0* 8.6* 8.9*  HCT 29.0* 27.7* 29.2*  MCV 84.3 82.9 83.2  MCH 26.2 25.7* 25.4*  MCHC 31.0 31.0 30.5  RDW 15.6* 15.5 15.8*  PLT 277 283 304   BNPNo results for input(s): "BNP", "PROBNP" in the last 168 hours.  DDimer  Recent Labs  Lab 03/01/23 1428  DDIMER 0.78*   Radiology/Studies:  ECHOCARDIOGRAM COMPLETE  Result Date: 03/03/2023    ECHOCARDIOGRAM REPORT   Patient Name:   Shannon Scott Date of Exam: 03/03/2023 Medical Rec #:  [id number]         Height:       61.0 in Accession #:    [id number]        Weight:       147.9 lb Date of Birth:  [birth year]          BSA:          1.662 m Patient Age:    82 years          BP:           130/54 mmHg Patient Gender: F                 HR:           65 bpm. Exam Location:  Inpatient Procedure: 2D Echo, Cardiac Doppler and Color Doppler Indications:    NSTEMI                 Aortic stenosis  History:        Patient has prior history of Echocardiogram examinations, most                 recent 03/02/2022. CAD, Prior CABG, Carotid Disease and  Stroke,                 Aortic Valve Disease, Arrythmias:Atrial Fibrillation; Risk                 Factors:Hypertension and Dyslipidemia. Cancer.  Sonographer:    Milda Smart Referring Phys: [id number] JONATHAN SEGARS  Sonographer Comments: Image acquisition challenging due to patient body habitus and Image acquisition challenging due to respiratory motion. IMPRESSIONS  1. Left ventricular ejection fraction, by estimation, is 55 to 60%. The left ventricle has normal function. The left ventricle has no regional wall motion abnormalities. There is mild concentric left ventricular hypertrophy. Left ventricular diastolic parameters are consistent with Grade II diastolic dysfunction (pseudonormalization).  2. Right ventricular systolic function is low normal. The right ventricular size is normal. There is normal pulmonary artery systolic pressure. The estimated right ventricular systolic pressure is 24.7 mmHg.  3. Left atrial size was mildly dilated.  4. Right atrial size was mildly dilated.  5. The mitral valve is degenerative. Mild to moderate mitral valve regurgitation. No evidence of mitral stenosis. Moderate to severe mitral annular calcification.  6. Tricuspid valve regurgitation is moderate.  7. The aortic valve is tricuspid. There is  [facility]   MULTIDISCIPLINARY HEART VALVE TEAM  Cardiology Consultation:   Patient ID: Shannon Scott MRN: [id number]; DOB: 1941/01/03  Admit date: 03/01/2023 Date of Consult: 03/03/2023  Primary Care Provider: [name], MD [facility] Cardiologist: Chrystie Nose, MD   Patient Profile:   Shannon Scott is a 82 y.o. female with a hx of CAD s/p CABG x3 2005, PAF on Eliquis, hx of CVA, carotid artery disease with LICA 60-79%/RICA 40-59%/[place] >50% from 2016, HLD with statin intolerance, HTN, prior DVT, chronic LL lymphedema, endometrial cancer with mets to colon 2019 s/p chemoradiation/immunotherapy/hysterectomy BSO, colonic mass with partial colectomy/colostomy 2016, and previous moderate aortic stenosis who is being seen today for the evaluation of possible TAVR due to progression of AS at the request of Dr. Allyson Sabal.  History of Present Illness:   Ms. Myers is a very pleasant 82yo female who lives alone here in [place]. She has one son who is very supportive and is at her bedside. Although she does not drive due to hx of CVA, she is able to care for herself and complete normal home tasks with no difficulty. She ambulates at time with a walker for safety precautions given stroke history as well. She follows with a dentist on a regular basis with no active dental issues.   Ms. Schwan is followed by Dr. Rennis Golden for her cardiology care. Per chart review, she has a distant history of CAD and had a CABG in 2005 with LIMA to LAD, SVG to circumflex, and SVG to PDA.  Her most recent ischemic evaluation was a stress test in 2013 that was negative for ischemia. She also has a history of carotid artery disease, underwent carotid Dopplers in 2016 that demonstrated 40% to 59% right internal carotid stenosis and 60% to 79% left internal carotid stenosis with no recent dopplers.    In 2021 she was admitted with expressive aphasia, right-sided headache and neck pain.  Work-up revealed a left MCA infarct. She was started on Eliquis for stroke prevention. Echocardiogram at that time showed an EF 60-65%, no regional wall motion abnormalities, mild LVH with grade 2 diastolic dysfunction, normal RV systolic pressure, mildly elevated pulmonary artery systolic pressure, mild mitral valve regurgitation, moderate aortic valve stenosis.   She was seen in follow up 04/2022 at which time she was doing well with no concerns. Given moderate AS found on echo, plan was to update with repeat imaging and follow in 6 months.  Unfortunately she presented to the ED after experiencing chest pain and acute SOB while at an oncology appointment 10/1. HsT found to be elevated with a peak of 628 with EKG changes therefore she was transferred to [facility] with cardiology consultation for NSTEMI. LHC and echo were planned. Patient also has known iron deficiency anemia and receives OP iron infusions with Dr. Myna Hidalgo. Hb found to be  hovering around 8.6 with PLT at 283 and MCV at 83. She was seen by her hematologist who recommended iron and PRBC infusions given cardiac demand.  LHC 10/2 showed diffuse native CAD with patient LIMA to LAD, patent SVG to RCA with tandem 70% stenosis, mild ectasia noted in the midsegment of the body of the saphenous vein graft with TIMI-3 flow, and degenerated SVG to OM2 with a focal 95% stenosis following which there is poststenotic tandem large aneurysmal area to the insertion to the native vessel. Given her frailty and severe anemia no intervention was pursued as PCI and long term DAPT was felt  [facility]   MULTIDISCIPLINARY HEART VALVE TEAM  Cardiology Consultation:   Patient ID: Shannon Scott MRN: [id number]; DOB: 1941/01/03  Admit date: 03/01/2023 Date of Consult: 03/03/2023  Primary Care Provider: [name], MD [facility] Cardiologist: Chrystie Nose, MD   Patient Profile:   Shannon Scott is a 82 y.o. female with a hx of CAD s/p CABG x3 2005, PAF on Eliquis, hx of CVA, carotid artery disease with LICA 60-79%/RICA 40-59%/[place] >50% from 2016, HLD with statin intolerance, HTN, prior DVT, chronic LL lymphedema, endometrial cancer with mets to colon 2019 s/p chemoradiation/immunotherapy/hysterectomy BSO, colonic mass with partial colectomy/colostomy 2016, and previous moderate aortic stenosis who is being seen today for the evaluation of possible TAVR due to progression of AS at the request of Dr. Allyson Sabal.  History of Present Illness:   Ms. Myers is a very pleasant 82yo female who lives alone here in [place]. She has one son who is very supportive and is at her bedside. Although she does not drive due to hx of CVA, she is able to care for herself and complete normal home tasks with no difficulty. She ambulates at time with a walker for safety precautions given stroke history as well. She follows with a dentist on a regular basis with no active dental issues.   Ms. Schwan is followed by Dr. Rennis Golden for her cardiology care. Per chart review, she has a distant history of CAD and had a CABG in 2005 with LIMA to LAD, SVG to circumflex, and SVG to PDA.  Her most recent ischemic evaluation was a stress test in 2013 that was negative for ischemia. She also has a history of carotid artery disease, underwent carotid Dopplers in 2016 that demonstrated 40% to 59% right internal carotid stenosis and 60% to 79% left internal carotid stenosis with no recent dopplers.    In 2021 she was admitted with expressive aphasia, right-sided headache and neck pain.  Work-up revealed a left MCA infarct. She was started on Eliquis for stroke prevention. Echocardiogram at that time showed an EF 60-65%, no regional wall motion abnormalities, mild LVH with grade 2 diastolic dysfunction, normal RV systolic pressure, mildly elevated pulmonary artery systolic pressure, mild mitral valve regurgitation, moderate aortic valve stenosis.   She was seen in follow up 04/2022 at which time she was doing well with no concerns. Given moderate AS found on echo, plan was to update with repeat imaging and follow in 6 months.  Unfortunately she presented to the ED after experiencing chest pain and acute SOB while at an oncology appointment 10/1. HsT found to be elevated with a peak of 628 with EKG changes therefore she was transferred to [facility] with cardiology consultation for NSTEMI. LHC and echo were planned. Patient also has known iron deficiency anemia and receives OP iron infusions with Dr. Myna Hidalgo. Hb found to be  hovering around 8.6 with PLT at 283 and MCV at 83. She was seen by her hematologist who recommended iron and PRBC infusions given cardiac demand.  LHC 10/2 showed diffuse native CAD with patient LIMA to LAD, patent SVG to RCA with tandem 70% stenosis, mild ectasia noted in the midsegment of the body of the saphenous vein graft with TIMI-3 flow, and degenerated SVG to OM2 with a focal 95% stenosis following which there is poststenotic tandem large aneurysmal area to the insertion to the native vessel. Given her frailty and severe anemia no intervention was pursued as PCI and long term DAPT was felt

## 2023-03-03 NOTE — Progress Notes (Signed)
Patient Name: Shannon Scott Date of Encounter: 03/03/2023 [facility] Cardiologist: Chrystie Nose, MD   Interval Summary  .    Patient reports no acute dyspnea this morning and no chest pain. Upon further discussion of symptoms leading to this admission, she does report dyspnea with exertion and exertional intolerance, a bit of a change from our understanding of symptoms yesterday.   Vital Signs .    Vitals:   03/02/23 2032 03/03/23 0006 03/03/23 0423 03/03/23 0730  BP: (!) 157/62 (!) 157/61 128/74 (!) 130/54  Pulse: 75 66 (!) 55 62  Resp: 18 17 18 19   Temp: 98.5 F (36.9 C) 98.2 F (36.8 C) 98.2 F (36.8 C) 98.5 F (36.9 C)  TempSrc: Oral Oral Oral Oral  SpO2: 91% 93% 96% 93%  Weight:   67.1 kg   Height:        Intake/Output Summary (Last 24 hours) at 03/03/2023 1013 Last data filed at 03/03/2023 0700 Gross per 24 hour  Intake 180 ml  Output 1700 ml  Net -1520 ml      03/03/2023    4:23 AM 03/02/2023    4:47 AM 03/01/2023   10:21 PM  Last 3 Weights  Weight (lbs) 147 lb 14.9 oz 145 lb 146 lb 14.4 oz  Weight (kg) 67.1 kg 65.772 kg 66.633 kg      Telemetry/ECG    Sinus rhythm - Personally Reviewed  Physical Exam .   GEN: No acute distress.   Neck: No JVD Cardiac: RRR, no murmurs, rubs, or gallops.  Respiratory: faint bibasilar crackles GI: Soft, nontender, non-distended  MS: Trace RLE edema. 1-2+ edema in left leg. Chronic per patient with lymphedema.   Assessment & Plan .     NSTEMI CAD s/p 3v CABG '05  Presented with chest pressure and dyspnea. hsTn [phone]. EKG showed slight ST depression in anterolateral leads. LHC with diffuxe LM disease 40-60%, prox LAD 50-60% stenosis, after D1, 95% stenosis with competitive LIMA filling, LCX diffusely diseased, occluded in mid segment after origin of small OM2. RCA dominant and occluded in mid segment. LIMA to LAD widely patent, SVG to RCA with tamdem 70% stenosis TIMI III. SVG to OM2 with focla  95% stenosis with post stenosis tandem large aneurysms.   Given frailty and severe anemia, medical management recommended. Anemia would make PCI and subsequent long term DAPT high risk.  Resume Eliquis with close monitoring of HGB. Continue ASA, Coreg, Zetia.   Hypertension  Patient with hypertension on 10/2, much improved today. Continue Coreg 6.25mg  BID, Diltiazem 300mg  daily.  Paroxysmal atrial fibrillation  Patient currently in sinus rhythm.   Resume Eliquis per Dr. Allyson Sabal. Continue Diltiazem 300mg  and Coreg 6.25mg  BID.   Severe AS HFpEF  TTE this admission with likely paradoxical low flow/low gradient aortic valve stenosis. VTI 0.79 is down from 1.07 in 2023 and mean gradient up to 24 mmHg. Overall, LVEF preserved at 55-60%.  Suspect multi-faceted etiology to patient's dyspnea. Troponin elevation and ECG changes support NSTEMI, but patient's exertional dyspnea also consistent with progressive AS.   Per Dr. Allyson Sabal, will request structural heart team input on possible valve repair. Given chronic anemia and overall frailty, unclear that she would be a candidate. Will also need to discuss ongoing use of high dose Diltiazem, question whether this may be exacerbating pressure gradient across valve.  Lower lobe crackles noted bilaterally on exam today. IV lasix 30mg  has been ordered in conjunction with transfusion by Dr. Myna Hidalgo today. TTE  with RA pressure and collapsible IVC. May need PRN lasix at d/c though low diastolic BP may complicate this. Would closely follow response to IV lasix today to better determine.   IDA  Patient with chronic outpatient iron infusions. Was seen by Dr. Myna Hidalgo today who plans to transfuse.   HGB 8.6->8.9.  Per Primary Hx of endometrial CA Partial colectomy and colostomy Prior DVT Hypothyroidism  For questions or updates, please contact [facility] Please consult [email or web address] for contact info under        Signed, Perlie Gold,  PA-C

## 2023-03-03 NOTE — Progress Notes (Signed)
Shannon Scott is well-known to me.  She is an 82 year old white female.  She has history of recurrent endometrial cancer.  Fortunately, she has a Lynch syndrome and has been treated with immunotherapy alone.  She does have a colostomy.  She has had no treatment now for a couple years.  We revisited her for her anemia.  She has multi factorial anemia-most likely secondary to iron deficiency..  She is seen in the office on 03/01/2023.  She is complaining of some chest discomfort when she walked from the car to the office.  She has a history of coronary artery disease.  She has had a past CABG.  She is on anticoagulation because of having history of paroxysmal atrial fibrillation..  We got down to the emergency room.  She found to have a markedly elevated troponin I.  She was admitted.  She did have a cardiac cath.  She was placed on heparin.  Cardiac cath did show that she did have coronary artery disease.  I am not surprised since has been about 20 years and she has had her bypass.  It was decided not to pursue any stenting.  She is on medical therapy at this point.  She did get some IV iron.  As you got a dose of Venofer.  In the office, her ferritin was low at 53 with an iron saturation of 10%.  Her CBC yesterday shows a white cell count of 5.  Hemoglobin 8.6.  Platelet count 283,000.  MCV was 83.  I really believe that she is going need to have a transfusion.  She can get IV iron.  However, the IV iron will not kick in for a couple weeks.  Given her coronary artery issues and the ischemia, I would think that the demand on her heart needs to be decreased.  I think blood transfusion will do this.  Of note, she did have a Doppler of her left leg.  This was negative for any thrombus.  She currently does not complain of any chest pain.  She is on baby aspirin right now.  She was taken off heparin.  Of note, her electrolytes yesterday showed a sodium 142.  Potassium 3.1.  BUN 8 creatinine 0.71.   Calcium 8.9 with an albumin of 3.8.   On her physical exam, vital signs are temperature 98.2.  Pulse 55.  Blood pressure 128/74.  Oxygen saturation is 96% on room air.  Head and neck exam shows no ocular or oral lesions.  She has some pale conjunctiva.  Lungs are relatively clear bilaterally.  She has good air movement bilaterally.  Cardiac exam is regular rate and rhythm.  He has a 2/6 systolic ejection murmur.  Abdomen is soft.  She has a colostomy that is intact.  She has a periosteal hernia.  Extremity shows edema which is actually much improved.  She has more lymphedema in the left leg than in the right leg.  She has little bit of stasis dermatitis.  Neurological exam is nonfocal.   I am so glad that she was able to be admitted and have her evaluation so quickly.  Clearly, her coronary artery disease is a major factor in her prognosis.  Her cancer certainly is not a problem right now.  Again, I really think that a transfusion is going to help her.  Again I will take away some of the "demand" on her heart.  I think 1 unit would be appropriate for her.  Again, I know  that she has received incredible care from everybody up on 3 E.  I told her she and her son about the transfusion.  She certainly is amenable for transfusion.  She would like to go home.  However, she wants to make sure that she goes home and the best shape possible.   Christin Bach, MD  Psalm 41:3

## 2023-03-03 NOTE — Progress Notes (Addendum)
Pt not in room will f/u when time allows.   Revisited at 1010, pt receiving blood. Pt and family received MI book but want to be educated in the afternoon.

## 2023-03-03 NOTE — Progress Notes (Signed)
  Echocardiogram 2D Echocardiogram has been performed.  Shannon Scott 03/03/2023, 9:11 AM

## 2023-03-03 NOTE — Plan of Care (Signed)
  Problem: Education: Goal: Understanding of cardiac disease, CV risk reduction, and recovery process will improve Outcome: Progressing Goal: Individualized Educational Video(s) Outcome: Progressing   Problem: Activity: Goal: Ability to tolerate increased activity will improve Outcome: Progressing   Problem: Clinical Measurements: Goal: Ability to maintain clinical measurements within normal limits will improve Outcome: Progressing Goal: Will remain free from infection Outcome: Progressing

## 2023-03-03 NOTE — Progress Notes (Signed)
4.6 5.0 7.2  NEUTROABS 3.4  --  5.5  HGB  9.0* 8.6* 8.9*  HCT 29.0* 27.7* 29.2*  MCV 84.3 82.9 83.2  PLT 277 283 304   Basic Metabolic Panel: Recent Labs  Lab 03/01/23 1210 03/02/23 0340 03/03/23 0626  NA 140 142 136  K 3.8 3.1* 4.0  CL 104 104 106  CO2 27 24 24   GLUCOSE 101* 106* 112*  BUN 11 8 7*  CREATININE 0.82 0.71 0.94  CALCIUM 8.9 8.9 8.4*  MG  --  1.8 2.1  PHOS  --  3.5  --    GFR: Estimated Creatinine Clearance: 41.1 mL/min (by C-G formula based on SCr of 0.94 mg/dL). Liver Function Tests: Recent Labs  Lab 03/01/23 1210  AST 13*  ALT 5  ALKPHOS 80  BILITOT 0.3  PROT 6.8  ALBUMIN 3.8   No results for input(s): "LIPASE", "AMYLASE" in the last 168 hours. No results for input(s): "AMMONIA" in the last 168 hours. Coagulation Profile: Recent Labs  Lab 03/02/23 0340  INR 1.5*   Cardiac Enzymes: No results for input(s): "CKTOTAL", "CKMB", "CKMBINDEX", "TROPONINI" in the last 168 hours. BNP (last 3 results) No results for input(s): "PROBNP" in the last 8760 hours. HbA1C: Recent Labs    03/02/23 0340  HGBA1C 5.5   CBG: No results for input(s): "GLUCAP" in the last 168 hours. Lipid Profile: Recent Labs    03/02/23 0340  CHOL 194  HDL 55  LDLCALC 127*  TRIG 60  CHOLHDL 3.5   Thyroid Function Tests: No results for input(s): "TSH", "T4TOTAL", "FREET4", "T3FREE", "THYROIDAB" in the last 72 hours. Anemia Panel: Recent Labs    03/01/23 1210  FERRITIN 53  TIBC 262  IRON 25*  RETICCTPCT 1.1   Sepsis Labs: No results for input(s): "PROCALCITON", "LATICACIDVEN" in the last 168 hours.  Recent Results (from the past 240 hour(s))  MRSA Next Gen by PCR, Nasal     Status: None   Collection Time: 03/01/23 11:37 PM   Specimen: Urine, Clean Catch; Nasal Swab  Result Value Ref Range Status   MRSA by PCR Next Gen NOT DETECTED NOT DETECTED Final    Comment: (NOTE) The GeneXpert MRSA Assay (FDA approved for NASAL specimens only), is one component of a comprehensive MRSA colonization  surveillance program. It is not intended to diagnose MRSA infection nor to guide or monitor treatment for MRSA infections. Test performance is not FDA approved in patients less than 3 years old. Performed at [facility], [address] [place], [place] [address]          Radiology Studies: ECHOCARDIOGRAM COMPLETE  Result Date: 03/03/2023    ECHOCARDIOGRAM REPORT   Patient Name:   Shannon Scott Date of Exam: 03/03/2023 Medical Rec #:  [id number]         Height:       61.0 in Accession #:    [id number]        Weight:       147.9 lb Date of Birth:  09-04-1940          BSA:          1.662 m Patient Age:    82 years          BP:           130/54 mmHg Patient Gender: F                 HR:  4.6 5.0 7.2  NEUTROABS 3.4  --  5.5  HGB  9.0* 8.6* 8.9*  HCT 29.0* 27.7* 29.2*  MCV 84.3 82.9 83.2  PLT 277 283 304   Basic Metabolic Panel: Recent Labs  Lab 03/01/23 1210 03/02/23 0340 03/03/23 0626  NA 140 142 136  K 3.8 3.1* 4.0  CL 104 104 106  CO2 27 24 24   GLUCOSE 101* 106* 112*  BUN 11 8 7*  CREATININE 0.82 0.71 0.94  CALCIUM 8.9 8.9 8.4*  MG  --  1.8 2.1  PHOS  --  3.5  --    GFR: Estimated Creatinine Clearance: 41.1 mL/min (by C-G formula based on SCr of 0.94 mg/dL). Liver Function Tests: Recent Labs  Lab 03/01/23 1210  AST 13*  ALT 5  ALKPHOS 80  BILITOT 0.3  PROT 6.8  ALBUMIN 3.8   No results for input(s): "LIPASE", "AMYLASE" in the last 168 hours. No results for input(s): "AMMONIA" in the last 168 hours. Coagulation Profile: Recent Labs  Lab 03/02/23 0340  INR 1.5*   Cardiac Enzymes: No results for input(s): "CKTOTAL", "CKMB", "CKMBINDEX", "TROPONINI" in the last 168 hours. BNP (last 3 results) No results for input(s): "PROBNP" in the last 8760 hours. HbA1C: Recent Labs    03/02/23 0340  HGBA1C 5.5   CBG: No results for input(s): "GLUCAP" in the last 168 hours. Lipid Profile: Recent Labs    03/02/23 0340  CHOL 194  HDL 55  LDLCALC 127*  TRIG 60  CHOLHDL 3.5   Thyroid Function Tests: No results for input(s): "TSH", "T4TOTAL", "FREET4", "T3FREE", "THYROIDAB" in the last 72 hours. Anemia Panel: Recent Labs    03/01/23 1210  FERRITIN 53  TIBC 262  IRON 25*  RETICCTPCT 1.1   Sepsis Labs: No results for input(s): "PROCALCITON", "LATICACIDVEN" in the last 168 hours.  Recent Results (from the past 240 hour(s))  MRSA Next Gen by PCR, Nasal     Status: None   Collection Time: 03/01/23 11:37 PM   Specimen: Urine, Clean Catch; Nasal Swab  Result Value Ref Range Status   MRSA by PCR Next Gen NOT DETECTED NOT DETECTED Final    Comment: (NOTE) The GeneXpert MRSA Assay (FDA approved for NASAL specimens only), is one component of a comprehensive MRSA colonization  surveillance program. It is not intended to diagnose MRSA infection nor to guide or monitor treatment for MRSA infections. Test performance is not FDA approved in patients less than 3 years old. Performed at [facility], [address] [place], [place] [address]          Radiology Studies: ECHOCARDIOGRAM COMPLETE  Result Date: 03/03/2023    ECHOCARDIOGRAM REPORT   Patient Name:   Shannon Scott Date of Exam: 03/03/2023 Medical Rec #:  [id number]         Height:       61.0 in Accession #:    [id number]        Weight:       147.9 lb Date of Birth:  09-04-1940          BSA:          1.662 m Patient Age:    82 years          BP:           130/54 mmHg Patient Gender: F                 HR:  4.6 5.0 7.2  NEUTROABS 3.4  --  5.5  HGB  9.0* 8.6* 8.9*  HCT 29.0* 27.7* 29.2*  MCV 84.3 82.9 83.2  PLT 277 283 304   Basic Metabolic Panel: Recent Labs  Lab 03/01/23 1210 03/02/23 0340 03/03/23 0626  NA 140 142 136  K 3.8 3.1* 4.0  CL 104 104 106  CO2 27 24 24   GLUCOSE 101* 106* 112*  BUN 11 8 7*  CREATININE 0.82 0.71 0.94  CALCIUM 8.9 8.9 8.4*  MG  --  1.8 2.1  PHOS  --  3.5  --    GFR: Estimated Creatinine Clearance: 41.1 mL/min (by C-G formula based on SCr of 0.94 mg/dL). Liver Function Tests: Recent Labs  Lab 03/01/23 1210  AST 13*  ALT 5  ALKPHOS 80  BILITOT 0.3  PROT 6.8  ALBUMIN 3.8   No results for input(s): "LIPASE", "AMYLASE" in the last 168 hours. No results for input(s): "AMMONIA" in the last 168 hours. Coagulation Profile: Recent Labs  Lab 03/02/23 0340  INR 1.5*   Cardiac Enzymes: No results for input(s): "CKTOTAL", "CKMB", "CKMBINDEX", "TROPONINI" in the last 168 hours. BNP (last 3 results) No results for input(s): "PROBNP" in the last 8760 hours. HbA1C: Recent Labs    03/02/23 0340  HGBA1C 5.5   CBG: No results for input(s): "GLUCAP" in the last 168 hours. Lipid Profile: Recent Labs    03/02/23 0340  CHOL 194  HDL 55  LDLCALC 127*  TRIG 60  CHOLHDL 3.5   Thyroid Function Tests: No results for input(s): "TSH", "T4TOTAL", "FREET4", "T3FREE", "THYROIDAB" in the last 72 hours. Anemia Panel: Recent Labs    03/01/23 1210  FERRITIN 53  TIBC 262  IRON 25*  RETICCTPCT 1.1   Sepsis Labs: No results for input(s): "PROCALCITON", "LATICACIDVEN" in the last 168 hours.  Recent Results (from the past 240 hour(s))  MRSA Next Gen by PCR, Nasal     Status: None   Collection Time: 03/01/23 11:37 PM   Specimen: Urine, Clean Catch; Nasal Swab  Result Value Ref Range Status   MRSA by PCR Next Gen NOT DETECTED NOT DETECTED Final    Comment: (NOTE) The GeneXpert MRSA Assay (FDA approved for NASAL specimens only), is one component of a comprehensive MRSA colonization  surveillance program. It is not intended to diagnose MRSA infection nor to guide or monitor treatment for MRSA infections. Test performance is not FDA approved in patients less than 3 years old. Performed at [facility], [address] [place], [place] [address]          Radiology Studies: ECHOCARDIOGRAM COMPLETE  Result Date: 03/03/2023    ECHOCARDIOGRAM REPORT   Patient Name:   Shannon Scott Date of Exam: 03/03/2023 Medical Rec #:  [id number]         Height:       61.0 in Accession #:    [id number]        Weight:       147.9 lb Date of Birth:  09-04-1940          BSA:          1.662 m Patient Age:    82 years          BP:           130/54 mmHg Patient Gender: F                 HR:  PROGRESS NOTE    Shannon Scott  EGB:[id number] DOB: April 19, 1941 DOA: 03/01/2023 PCP: [name], MD   Brief Narrative:  82 y.o. female with hx of CABG x 3, moderate aortic stenosis, locally advanced endometrial cancer status post chemoradiation, immunotherapy, hysterectomy BSO, colonic mass with partial colectomy and colostomy, prior DVT, A-fib on anticoagulation, hypertension, hyperlipidemia, statin intolerance, lymphedema, asthma, hypothyroidism presented with chest pain and dyspnea on exertion.  On presentation, troponins were 509 and 519.  Cardiology was consulted.  She was started on heparin drip. She underwent cardiac catheterization on 03/03/23.  Assessment & Plan:   Non-STEMI in a patient with history of CAD status post CABG x 3 Moderate aortic stenosis -Presented with chest pain and exertional dyspnea.  EKG showed T wave inversion inferior laterally.  High-sensitivity troponins were 509 and 519. -Cardiology following.  She underwent cardiac catheterization on 03/03/23.  Follow further cardiology recommendations. -Continue aspirin, Coreg, ezetimibe.  History of statin intolerance -Follow-up 2D echo.  Paroxysmal A-fib -Continue Coreg.  Eliquis has already been resumed.  Hypokalemia -Improved.  History of iron deficiency anemia -Receives IV iron outpatient.  Hemoglobin 8.9 this morning.  1 unit packed red cell transfusion has been ordered by oncology/Dr. Myna Hidalgo.  Monitor hemoglobin.  Received IV iron as well.  Hypertension -Continue Coreg and diltiazem.  Hypothyroidism -continue levothyroxine  History of locally advanced endometrial cancer -Oncology following.  Outpatient oncology follow-up.  Cancer-related pain/chronic pain -Continue tramadol as needed  Chronic lymphedema -Elevate lower extremities.  Outpatient follow-up  DVT prophylaxis: Heparin drip Code Status: Full Family Communication: Son at bedside Disposition Plan: Status is:  Inpatient Remains inpatient appropriate because: Of severity of illness  Consultants: Cardiology  Procedures: None  Antimicrobials: None   Subjective: Patient seen and examined at bedside.  Denies any current chest pain.  No worsening shortness breath, fever or vomiting reported. Objective: Vitals:   03/03/23 0006 03/03/23 0423 03/03/23 0730 03/03/23 1008  BP: (!) 157/61 128/74 (!) 130/54 (!) 120/91  Pulse: 66 (!) 55 62 (!) 58  Resp: 17 18 19 16   Temp: 98.2 F (36.8 C) 98.2 F (36.8 C) 98.5 F (36.9 C) 98.4 F (36.9 C)  TempSrc: Oral Oral Oral Oral  SpO2: 93% 96% 93%   Weight:  67.1 kg    Height:        Intake/Output Summary (Last 24 hours) at 03/03/2023 1033 Last data filed at 03/03/2023 1008 Gross per 24 hour  Intake 180 ml  Output 1700 ml  Net -1520 ml   Filed Weights   03/01/23 2221 03/02/23 0447 03/03/23 0423  Weight: 66.6 kg 65.8 kg 67.1 kg    Examination:  General: On room air currently.  No distress.  Chronically ill and deconditioned looking. ENT/neck: No thyromegaly.  JVD is not elevated  respiratory: Decreased breath sounds at bases bilaterally with some crackles; no wheezing  CVS: S1-S2 heard, mild intermittent bradycardia present  abdominal: Soft, nontender, slightly distended; no organomegaly, normal bowel sounds are heard Extremities: bilateral lower extremity lymphedema present with chronic venous stasis changes; scaling of the skin, erythema in the left greater than right lower extremity.  No clubbing CNS: Awake and alert.  No focal neurologic deficit.  Moves extremities Lymph: No obvious lymphadenopathy Skin: No obvious ecchymosis/lesions  psych: Mostly flat affect.  Not agitated currently. musculoskeletal: No obvious joint swelling/deformity     Data Reviewed: I have personally reviewed following labs and imaging studies  CBC: Recent Labs  Lab 03/01/23 1210 03/02/23 0340 03/03/23 0626  WBC  PROGRESS NOTE    Shannon Scott  EGB:[id number] DOB: April 19, 1941 DOA: 03/01/2023 PCP: [name], MD   Brief Narrative:  82 y.o. female with hx of CABG x 3, moderate aortic stenosis, locally advanced endometrial cancer status post chemoradiation, immunotherapy, hysterectomy BSO, colonic mass with partial colectomy and colostomy, prior DVT, A-fib on anticoagulation, hypertension, hyperlipidemia, statin intolerance, lymphedema, asthma, hypothyroidism presented with chest pain and dyspnea on exertion.  On presentation, troponins were 509 and 519.  Cardiology was consulted.  She was started on heparin drip. She underwent cardiac catheterization on 03/03/23.  Assessment & Plan:   Non-STEMI in a patient with history of CAD status post CABG x 3 Moderate aortic stenosis -Presented with chest pain and exertional dyspnea.  EKG showed T wave inversion inferior laterally.  High-sensitivity troponins were 509 and 519. -Cardiology following.  She underwent cardiac catheterization on 03/03/23.  Follow further cardiology recommendations. -Continue aspirin, Coreg, ezetimibe.  History of statin intolerance -Follow-up 2D echo.  Paroxysmal A-fib -Continue Coreg.  Eliquis has already been resumed.  Hypokalemia -Improved.  History of iron deficiency anemia -Receives IV iron outpatient.  Hemoglobin 8.9 this morning.  1 unit packed red cell transfusion has been ordered by oncology/Dr. Myna Hidalgo.  Monitor hemoglobin.  Received IV iron as well.  Hypertension -Continue Coreg and diltiazem.  Hypothyroidism -continue levothyroxine  History of locally advanced endometrial cancer -Oncology following.  Outpatient oncology follow-up.  Cancer-related pain/chronic pain -Continue tramadol as needed  Chronic lymphedema -Elevate lower extremities.  Outpatient follow-up  DVT prophylaxis: Heparin drip Code Status: Full Family Communication: Son at bedside Disposition Plan: Status is:  Inpatient Remains inpatient appropriate because: Of severity of illness  Consultants: Cardiology  Procedures: None  Antimicrobials: None   Subjective: Patient seen and examined at bedside.  Denies any current chest pain.  No worsening shortness breath, fever or vomiting reported. Objective: Vitals:   03/03/23 0006 03/03/23 0423 03/03/23 0730 03/03/23 1008  BP: (!) 157/61 128/74 (!) 130/54 (!) 120/91  Pulse: 66 (!) 55 62 (!) 58  Resp: 17 18 19 16   Temp: 98.2 F (36.8 C) 98.2 F (36.8 C) 98.5 F (36.9 C) 98.4 F (36.9 C)  TempSrc: Oral Oral Oral Oral  SpO2: 93% 96% 93%   Weight:  67.1 kg    Height:        Intake/Output Summary (Last 24 hours) at 03/03/2023 1033 Last data filed at 03/03/2023 1008 Gross per 24 hour  Intake 180 ml  Output 1700 ml  Net -1520 ml   Filed Weights   03/01/23 2221 03/02/23 0447 03/03/23 0423  Weight: 66.6 kg 65.8 kg 67.1 kg    Examination:  General: On room air currently.  No distress.  Chronically ill and deconditioned looking. ENT/neck: No thyromegaly.  JVD is not elevated  respiratory: Decreased breath sounds at bases bilaterally with some crackles; no wheezing  CVS: S1-S2 heard, mild intermittent bradycardia present  abdominal: Soft, nontender, slightly distended; no organomegaly, normal bowel sounds are heard Extremities: bilateral lower extremity lymphedema present with chronic venous stasis changes; scaling of the skin, erythema in the left greater than right lower extremity.  No clubbing CNS: Awake and alert.  No focal neurologic deficit.  Moves extremities Lymph: No obvious lymphadenopathy Skin: No obvious ecchymosis/lesions  psych: Mostly flat affect.  Not agitated currently. musculoskeletal: No obvious joint swelling/deformity     Data Reviewed: I have personally reviewed following labs and imaging studies  CBC: Recent Labs  Lab 03/01/23 1210 03/02/23 0340 03/03/23 0626  WBC  PROGRESS NOTE    Shannon Scott  EGB:[id number] DOB: April 19, 1941 DOA: 03/01/2023 PCP: [name], MD   Brief Narrative:  82 y.o. female with hx of CABG x 3, moderate aortic stenosis, locally advanced endometrial cancer status post chemoradiation, immunotherapy, hysterectomy BSO, colonic mass with partial colectomy and colostomy, prior DVT, A-fib on anticoagulation, hypertension, hyperlipidemia, statin intolerance, lymphedema, asthma, hypothyroidism presented with chest pain and dyspnea on exertion.  On presentation, troponins were 509 and 519.  Cardiology was consulted.  She was started on heparin drip. She underwent cardiac catheterization on 03/03/23.  Assessment & Plan:   Non-STEMI in a patient with history of CAD status post CABG x 3 Moderate aortic stenosis -Presented with chest pain and exertional dyspnea.  EKG showed T wave inversion inferior laterally.  High-sensitivity troponins were 509 and 519. -Cardiology following.  She underwent cardiac catheterization on 03/03/23.  Follow further cardiology recommendations. -Continue aspirin, Coreg, ezetimibe.  History of statin intolerance -Follow-up 2D echo.  Paroxysmal A-fib -Continue Coreg.  Eliquis has already been resumed.  Hypokalemia -Improved.  History of iron deficiency anemia -Receives IV iron outpatient.  Hemoglobin 8.9 this morning.  1 unit packed red cell transfusion has been ordered by oncology/Dr. Myna Hidalgo.  Monitor hemoglobin.  Received IV iron as well.  Hypertension -Continue Coreg and diltiazem.  Hypothyroidism -continue levothyroxine  History of locally advanced endometrial cancer -Oncology following.  Outpatient oncology follow-up.  Cancer-related pain/chronic pain -Continue tramadol as needed  Chronic lymphedema -Elevate lower extremities.  Outpatient follow-up  DVT prophylaxis: Heparin drip Code Status: Full Family Communication: Son at bedside Disposition Plan: Status is:  Inpatient Remains inpatient appropriate because: Of severity of illness  Consultants: Cardiology  Procedures: None  Antimicrobials: None   Subjective: Patient seen and examined at bedside.  Denies any current chest pain.  No worsening shortness breath, fever or vomiting reported. Objective: Vitals:   03/03/23 0006 03/03/23 0423 03/03/23 0730 03/03/23 1008  BP: (!) 157/61 128/74 (!) 130/54 (!) 120/91  Pulse: 66 (!) 55 62 (!) 58  Resp: 17 18 19 16   Temp: 98.2 F (36.8 C) 98.2 F (36.8 C) 98.5 F (36.9 C) 98.4 F (36.9 C)  TempSrc: Oral Oral Oral Oral  SpO2: 93% 96% 93%   Weight:  67.1 kg    Height:        Intake/Output Summary (Last 24 hours) at 03/03/2023 1033 Last data filed at 03/03/2023 1008 Gross per 24 hour  Intake 180 ml  Output 1700 ml  Net -1520 ml   Filed Weights   03/01/23 2221 03/02/23 0447 03/03/23 0423  Weight: 66.6 kg 65.8 kg 67.1 kg    Examination:  General: On room air currently.  No distress.  Chronically ill and deconditioned looking. ENT/neck: No thyromegaly.  JVD is not elevated  respiratory: Decreased breath sounds at bases bilaterally with some crackles; no wheezing  CVS: S1-S2 heard, mild intermittent bradycardia present  abdominal: Soft, nontender, slightly distended; no organomegaly, normal bowel sounds are heard Extremities: bilateral lower extremity lymphedema present with chronic venous stasis changes; scaling of the skin, erythema in the left greater than right lower extremity.  No clubbing CNS: Awake and alert.  No focal neurologic deficit.  Moves extremities Lymph: No obvious lymphadenopathy Skin: No obvious ecchymosis/lesions  psych: Mostly flat affect.  Not agitated currently. musculoskeletal: No obvious joint swelling/deformity     Data Reviewed: I have personally reviewed following labs and imaging studies  CBC: Recent Labs  Lab 03/01/23 1210 03/02/23 0340 03/03/23 0626  WBC

## 2023-03-03 NOTE — Plan of Care (Signed)
  Problem: Education: Goal: Understanding of cardiac disease, CV risk reduction, and recovery process will improve Outcome: Progressing   Problem: Activity: Goal: Ability to tolerate increased activity will improve Outcome: Progressing   Problem: Education: Goal: Knowledge of General Education information will improve Description: Including pain rating scale, medication(s)/side effects and non-pharmacologic comfort measures Outcome: Progressing   Problem: Health Behavior/Discharge Planning: Goal: Ability to manage health-related needs will improve Outcome: Progressing   Problem: Clinical Measurements: Goal: Ability to maintain clinical measurements within normal limits will improve Outcome: Progressing Goal: Will remain free from infection Outcome: Progressing Goal: Diagnostic test results will improve Outcome: Progressing Goal: Respiratory complications will improve Outcome: Progressing Goal: Cardiovascular complication will be avoided Outcome: Progressing   Problem: Activity: Goal: Risk for activity intolerance will decrease Outcome: Progressing   Problem: Nutrition: Goal: Adequate nutrition will be maintained Outcome: Progressing   Problem: Coping: Goal: Level of anxiety will decrease Outcome: Progressing   Problem: Elimination: Goal: Will not experience complications related to bowel motility Outcome: Progressing Goal: Will not experience complications related to urinary retention Outcome: Progressing   Problem: Pain Managment: Goal: General experience of comfort will improve Outcome: Progressing   Problem: Safety: Goal: Ability to remain free from injury will improve Outcome: Progressing   Problem: Skin Integrity: Goal: Risk for impaired skin integrity will decrease Outcome: Progressing

## 2023-03-03 NOTE — Progress Notes (Signed)
ANTICOAGULATION CONSULT NOTE   Pharmacy Consult for heparin Indication: chest pain/ACS  Allergies  Allergen Reactions   Statins Other (See Comments)    Myalgias and memory problems   Ciprofloxacin Itching and Other (See Comments)    Splotchy redness with itching during IV infusion localized to arm. Other reaction(s): itching/swelling/redness at injection site    Patient Measurements: Height: 5\' 1"  (154.9 cm) Weight: 67.1 kg (147 lb 14.9 oz) IBW/kg (Calculated) : 47.8 Heparin Dosing Weight: 62.8 kg  Vital Signs: Temp: 98.4 F (36.9 C) (10/03 1008) Temp Source: Oral (10/03 1008) BP: 120/91 (10/03 1008) Pulse Rate: 58 (10/03 1008)  Labs: Recent Labs    03/01/23 1210 03/01/23 1428 03/01/23 1703 03/02/23 0023 03/02/23 0340 03/03/23 0626  HGB 9.0*  --   --   --  8.6* 8.9*  HCT 29.0*  --   --   --  27.7* 29.2*  PLT 277  --   --   --  283 304  APTT  --   --   --   --  152*  --   LABPROT  --   --   --   --  18.3*  --   INR  --   --   --   --  1.5*  --   HEPARINUNFRC  --   --   --   --  >1.10*  --   CREATININE 0.82  --   --   --  0.71 0.94  TROPONINIHS  --  509* 519* 628*  --   --     Estimated Creatinine Clearance: 41.1 mL/min (by C-G formula based on SCr of 0.94 mg/dL).   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 Last dose of eliquis at 6AM 10/1. Pharmacy consulted to resume PTA Eliquis. CBC is stable. Hematology is planning to transfuse today. Will continue to monitor  Monitor platelets by anticoagulation protocol: Yes   Plan:  Resume Eliquis 5 mg BID Continue to monitor CBC and for signs/symptoms of bleeding  Wilmer Floor, PharmD PGY2 Cardiology Pharmacy Resident 03/03/2023 10:19 AM

## 2023-03-04 ENCOUNTER — Encounter: Payer: Self-pay | Admitting: Family

## 2023-03-04 ENCOUNTER — Inpatient Hospital Stay (HOSPITAL_COMMUNITY): Payer: Medicare Other

## 2023-03-04 ENCOUNTER — Other Ambulatory Visit (HOSPITAL_COMMUNITY): Payer: Self-pay

## 2023-03-04 DIAGNOSIS — I214 Non-ST elevation (NSTEMI) myocardial infarction: Secondary | ICD-10-CM | POA: Diagnosis not present

## 2023-03-04 LAB — BASIC METABOLIC PANEL
Anion gap: 7 (ref 5–15)
BUN: 10 mg/dL (ref 8–23)
CO2: 26 mmol/L (ref 22–32)
Calcium: 8.1 mg/dL — ABNORMAL LOW (ref 8.9–10.3)
Chloride: 102 mmol/L (ref 98–111)
Creatinine, Ser: 0.98 mg/dL (ref 0.44–1.00)
GFR, Estimated: 58 mL/min — ABNORMAL LOW (ref >60)
Glucose, Bld: 121 mg/dL — ABNORMAL HIGH (ref 70–99)
Potassium: 3.7 mmol/L (ref 3.5–5.1)
Sodium: 135 mmol/L (ref 135–145)

## 2023-03-04 LAB — TYPE AND SCREEN
ABO/RH(D): O POS
Antibody Screen: NEGATIVE
Unit division: 0

## 2023-03-04 LAB — CBC WITH DIFFERENTIAL/PLATELET
Abs Immature Granulocytes: 0.04 10*3/uL (ref 0.00–0.07)
Basophils Absolute: 0 10*3/uL (ref 0.0–0.1)
Basophils Relative: 1 %
Eosinophils Absolute: 0.1 10*3/uL (ref 0.0–0.5)
Eosinophils Relative: 1 %
HCT: 32 % — ABNORMAL LOW (ref 36.0–46.0)
Hemoglobin: 10 g/dL — ABNORMAL LOW (ref 12.0–15.0)
Immature Granulocytes: 1 %
Lymphocytes Relative: 14 %
Lymphs Abs: 1 10*3/uL (ref 0.7–4.0)
MCH: 26.7 pg (ref 26.0–34.0)
MCHC: 31.3 g/dL (ref 30.0–36.0)
MCV: 85.6 fL (ref 80.0–100.0)
Monocytes Absolute: 0.5 10*3/uL (ref 0.1–1.0)
Monocytes Relative: 7 %
Neutro Abs: 5.7 10*3/uL (ref 1.7–7.7)
Neutrophils Relative %: 76 %
Platelets: 290 10*3/uL (ref 150–400)
RBC: 3.74 MIL/uL — ABNORMAL LOW (ref 3.87–5.11)
RDW: 15.5 % (ref 11.5–15.5)
WBC: 7.5 10*3/uL (ref 4.0–10.5)
nRBC: 0 % (ref 0.0–0.2)

## 2023-03-04 LAB — MAGNESIUM: Magnesium: 2 mg/dL (ref 1.7–2.4)

## 2023-03-04 LAB — BPAM RBC
Blood Product Expiration Date: 202410102359
ISSUE DATE / TIME: 202410030959
Unit Type and Rh: 5100

## 2023-03-04 LAB — LIPOPROTEIN A (LPA): Lipoprotein (a): 109.1 nmol/L — ABNORMAL HIGH

## 2023-03-04 MED ORDER — IOHEXOL 350 MG/ML SOLN
100.0000 mL | Freq: Once | INTRAVENOUS | Status: AC | PRN
  Administered 2023-03-04: 100 mL via INTRAVENOUS

## 2023-03-04 MED ORDER — POTASSIUM CHLORIDE CRYS ER 20 MEQ PO TBCR
40.0000 meq | EXTENDED_RELEASE_TABLET | Freq: Once | ORAL | Status: AC
  Administered 2023-03-04: 40 meq via ORAL
  Filled 2023-03-04: qty 2

## 2023-03-04 MED ORDER — DILTIAZEM HCL ER BEADS 300 MG PO CP24: 300.0000 mg | ORAL_CAPSULE | Freq: Every day | ORAL | Status: DC

## 2023-03-04 MED ORDER — NITROGLYCERIN 0.4 MG SL SUBL
0.4000 mg | SUBLINGUAL_TABLET | SUBLINGUAL | 0 refills | Status: AC | PRN
  Filled 2023-03-04: qty 25, 7d supply, fill #0

## 2023-03-04 MED ORDER — HEPARIN SOD (PORK) LOCK FLUSH 100 UNIT/ML IV SOLN
500.0000 [IU] | INTRAVENOUS | Status: AC | PRN
  Administered 2023-03-04: 500 [IU]

## 2023-03-04 MED ORDER — CARVEDILOL 6.25 MG PO TABS
6.2500 mg | ORAL_TABLET | Freq: Two times a day (BID) | ORAL | 0 refills | Status: DC
  Filled 2023-03-04: qty 60, 30d supply, fill #0

## 2023-03-04 MED ORDER — ASPIRIN 81 MG PO CHEW
81.0000 mg | CHEWABLE_TABLET | Freq: Every day | ORAL | 0 refills | Status: DC
  Filled 2023-03-04: qty 30, 30d supply, fill #0

## 2023-03-04 NOTE — Progress Notes (Signed)
10.0*  HCT 29.0* 27.7* 29.2* 32.0*  MCV 84.3 82.9 83.2 85.6  PLT 277 283 304 290   Basic Metabolic Panel: Recent Labs  Lab 03/01/23 1210 03/02/23 0340  03/03/23 0626 03/04/23 0355  NA 140 142 136 135  K 3.8 3.1* 4.0 3.7  CL 104 104 106 102  CO2 27 24 24 26   GLUCOSE 101* 106* 112* 121*  BUN 11 8 7* 10  CREATININE 0.82 0.71 0.94 0.98  CALCIUM 8.9 8.9 8.4* 8.1*  MG  --  1.8 2.1 2.0  PHOS  --  3.5  --   --    GFR: Estimated Creatinine Clearance: 39.3 mL/min (by C-G formula based on SCr of 0.98 mg/dL). Liver Function Tests: Recent Labs  Lab 03/01/23 1210  AST 13*  ALT 5  ALKPHOS 80  BILITOT 0.3  PROT 6.8  ALBUMIN 3.8   No results for input(s): "LIPASE", "AMYLASE" in the last 168 hours. No results for input(s): "AMMONIA" in the last 168 hours. Coagulation Profile: Recent Labs  Lab 03/02/23 0340  INR 1.5*   Cardiac Enzymes: No results for input(s): "CKTOTAL", "CKMB", "CKMBINDEX", "TROPONINI" in the last 168 hours. BNP (last 3 results) No results for input(s): "PROBNP" in the last 8760 hours. HbA1C: Recent Labs    03/02/23 0340  HGBA1C 5.5   CBG: No results for input(s): "GLUCAP" in the last 168 hours. Lipid Profile: Recent Labs    03/02/23 0340  CHOL 194  HDL 55  LDLCALC 127*  TRIG 60  CHOLHDL 3.5   Thyroid Function Tests: No results for input(s): "TSH", "T4TOTAL", "FREET4", "T3FREE", "THYROIDAB" in the last 72 hours. Anemia Panel: Recent Labs    03/01/23 1210  FERRITIN 53  TIBC 262  IRON 25*  RETICCTPCT 1.1   Sepsis Labs: No results for input(s): "PROCALCITON", "LATICACIDVEN" in the last 168 hours.  Recent Results (from the past 240 hour(s))  MRSA Next Gen by PCR, Nasal     Status: None   Collection Time: 03/01/23 11:37 PM   Specimen: Urine, Clean Catch; Nasal Swab  Result Value Ref Range Status   MRSA by PCR Next Gen NOT DETECTED NOT DETECTED Final    Comment: (NOTE) The GeneXpert MRSA Assay (FDA approved for NASAL specimens only), is one component of a comprehensive MRSA colonization surveillance program. It is not intended to diagnose MRSA infection nor to guide or monitor treatment for  MRSA infections. Test performance is not FDA approved in patients less than 66 years old. Performed at [facility], [address] [place], [place] [address]          Radiology Studies: ECHOCARDIOGRAM COMPLETE  Result Date: 03/03/2023    ECHOCARDIOGRAM REPORT   Patient Name:   Shannon Scott Date of Exam: 03/03/2023 Medical Rec #:  [id number]         Height:       61.0 in Accession #:    [id number]        Weight:       147.9 lb Date of Birth:  [birth year]          BSA:          1.662 m Patient Age:    82 years          BP:           130/54 mmHg Patient Gender: F                 HR:  10.0*  HCT 29.0* 27.7* 29.2* 32.0*  MCV 84.3 82.9 83.2 85.6  PLT 277 283 304 290   Basic Metabolic Panel: Recent Labs  Lab 03/01/23 1210 03/02/23 0340  03/03/23 0626 03/04/23 0355  NA 140 142 136 135  K 3.8 3.1* 4.0 3.7  CL 104 104 106 102  CO2 27 24 24 26   GLUCOSE 101* 106* 112* 121*  BUN 11 8 7* 10  CREATININE 0.82 0.71 0.94 0.98  CALCIUM 8.9 8.9 8.4* 8.1*  MG  --  1.8 2.1 2.0  PHOS  --  3.5  --   --    GFR: Estimated Creatinine Clearance: 39.3 mL/min (by C-G formula based on SCr of 0.98 mg/dL). Liver Function Tests: Recent Labs  Lab 03/01/23 1210  AST 13*  ALT 5  ALKPHOS 80  BILITOT 0.3  PROT 6.8  ALBUMIN 3.8   No results for input(s): "LIPASE", "AMYLASE" in the last 168 hours. No results for input(s): "AMMONIA" in the last 168 hours. Coagulation Profile: Recent Labs  Lab 03/02/23 0340  INR 1.5*   Cardiac Enzymes: No results for input(s): "CKTOTAL", "CKMB", "CKMBINDEX", "TROPONINI" in the last 168 hours. BNP (last 3 results) No results for input(s): "PROBNP" in the last 8760 hours. HbA1C: Recent Labs    03/02/23 0340  HGBA1C 5.5   CBG: No results for input(s): "GLUCAP" in the last 168 hours. Lipid Profile: Recent Labs    03/02/23 0340  CHOL 194  HDL 55  LDLCALC 127*  TRIG 60  CHOLHDL 3.5   Thyroid Function Tests: No results for input(s): "TSH", "T4TOTAL", "FREET4", "T3FREE", "THYROIDAB" in the last 72 hours. Anemia Panel: Recent Labs    03/01/23 1210  FERRITIN 53  TIBC 262  IRON 25*  RETICCTPCT 1.1   Sepsis Labs: No results for input(s): "PROCALCITON", "LATICACIDVEN" in the last 168 hours.  Recent Results (from the past 240 hour(s))  MRSA Next Gen by PCR, Nasal     Status: None   Collection Time: 03/01/23 11:37 PM   Specimen: Urine, Clean Catch; Nasal Swab  Result Value Ref Range Status   MRSA by PCR Next Gen NOT DETECTED NOT DETECTED Final    Comment: (NOTE) The GeneXpert MRSA Assay (FDA approved for NASAL specimens only), is one component of a comprehensive MRSA colonization surveillance program. It is not intended to diagnose MRSA infection nor to guide or monitor treatment for  MRSA infections. Test performance is not FDA approved in patients less than 66 years old. Performed at [facility], [address] [place], [place] [address]          Radiology Studies: ECHOCARDIOGRAM COMPLETE  Result Date: 03/03/2023    ECHOCARDIOGRAM REPORT   Patient Name:   Shannon Scott Date of Exam: 03/03/2023 Medical Rec #:  [id number]         Height:       61.0 in Accession #:    [id number]        Weight:       147.9 lb Date of Birth:  [birth year]          BSA:          1.662 m Patient Age:    82 years          BP:           130/54 mmHg Patient Gender: F                 HR:  PROGRESS NOTE    Shannon Scott  NWG:[id number] DOB: Mar 09, 1941 DOA: 03/01/2023 PCP: [name], MD   Brief Narrative:  82 y.o. female with hx of CABG x 3, moderate aortic stenosis, locally advanced endometrial cancer status post chemoradiation, immunotherapy, hysterectomy BSO, colonic mass with partial colectomy and colostomy, prior DVT, A-fib on anticoagulation, hypertension, hyperlipidemia, statin intolerance, lymphedema, asthma, hypothyroidism presented with chest pain and dyspnea on exertion.  On presentation, troponins were 509 and 519.  Cardiology was consulted.  She was started on heparin drip. She underwent cardiac catheterization on 03/03/23.  Assessment & Plan:   Non-STEMI in a patient with history of CAD status post CABG x 3 Moderate aortic stenosis -Presented with chest pain and exertional dyspnea.  EKG showed T wave inversion inferior laterally.  High-sensitivity troponins were 509 and 519. -Cardiology following.  She underwent cardiac catheterization on 03/03/23: Cardiology recommending medical management -Continue aspirin, Coreg, ezetimibe.  History of statin intolerance -2D echo showed EF of 55 to 60% with grade 2 diastolic dysfunction with severe aortic valve stenosis  Severe aortic valve stenosis -Structural cardiology team consulted who is ordered pre-TAVR CT scans.  Follow recommendations.  Paroxysmal A-fib -Continue Coreg.  Eliquis has already been resumed.  Hypokalemia -Improved.  History of iron deficiency anemia -Receives IV iron outpatient.  Status post 1 unit packed red cell transfusion on 03/03/2023.  Hemoglobin 10 today.  Monitor hemoglobin.  Received IV iron as well.  Outpatient follow-up with oncology.  Hypertension -Continue Coreg and diltiazem.  Hypothyroidism -continue levothyroxine  History of locally advanced endometrial cancer -Oncology following.  Outpatient oncology follow-up.  Cancer-related pain/chronic pain -Continue  tramadol as needed  Chronic lymphedema -Elevate lower extremities.  Outpatient follow-up  DVT prophylaxis: Heparin drip Code Status: Full Family Communication: Son at bedside Disposition Plan: Status is: Inpatient Remains inpatient appropriate because: Of severity of illness  Consultants: Cardiology  Procedures: None  Antimicrobials: None   Subjective: Patient seen and examined at bedside.  No fever, chest pain or shortness of breath reported.  Feels better today.   Objective: Vitals:   03/04/23 0355 03/04/23 0736 03/04/23 0808 03/04/23 0907  BP: (!) 119/45  (!) 143/52   Pulse: (!) 52 (!) 57 (!) 59 61  Resp: 16  18   Temp: 98.2 F (36.8 C)  98.5 F (36.9 C)   TempSrc: Oral  Oral   SpO2: 91% 92% 96% 96%  Weight: 66.5 kg     Height:        Intake/Output Summary (Last 24 hours) at 03/04/2023 1034 Last data filed at 03/04/2023 0600 Gross per 24 hour  Intake 992.5 ml  Output 1650 ml  Net -657.5 ml   Filed Weights   03/02/23 0447 03/03/23 0423 03/04/23 0355  Weight: 65.8 kg 67.1 kg 66.5 kg    Examination:  General: No acute distress.  Currently on room air.  Elderly female lying in bed.  Looks chronically ill and deconditioned. respiratory: Bilateral decreased breath sounds at bases with scattered crackles CVS: Mild intermittent bradycardia present; S1-S2 heard  abdominal: Soft, nontender, distended mildly; no organomegaly; bowel sounds heard.  Colostomy bag in place extremities: Bilateral lower extremity lymphedema with chronic venous stasis changes present.  No cyanosis      Data Reviewed: I have personally reviewed following labs and imaging studies  CBC: Recent Labs  Lab 03/01/23 1210 03/02/23 0340 03/03/23 0626 03/04/23 0355  WBC 4.6 5.0 7.2 7.5  NEUTROABS 3.4  --  5.5 5.7  HGB 9.0* 8.6* 8.9*  10.0*  HCT 29.0* 27.7* 29.2* 32.0*  MCV 84.3 82.9 83.2 85.6  PLT 277 283 304 290   Basic Metabolic Panel: Recent Labs  Lab 03/01/23 1210 03/02/23 0340  03/03/23 0626 03/04/23 0355  NA 140 142 136 135  K 3.8 3.1* 4.0 3.7  CL 104 104 106 102  CO2 27 24 24 26   GLUCOSE 101* 106* 112* 121*  BUN 11 8 7* 10  CREATININE 0.82 0.71 0.94 0.98  CALCIUM 8.9 8.9 8.4* 8.1*  MG  --  1.8 2.1 2.0  PHOS  --  3.5  --   --    GFR: Estimated Creatinine Clearance: 39.3 mL/min (by C-G formula based on SCr of 0.98 mg/dL). Liver Function Tests: Recent Labs  Lab 03/01/23 1210  AST 13*  ALT 5  ALKPHOS 80  BILITOT 0.3  PROT 6.8  ALBUMIN 3.8   No results for input(s): "LIPASE", "AMYLASE" in the last 168 hours. No results for input(s): "AMMONIA" in the last 168 hours. Coagulation Profile: Recent Labs  Lab 03/02/23 0340  INR 1.5*   Cardiac Enzymes: No results for input(s): "CKTOTAL", "CKMB", "CKMBINDEX", "TROPONINI" in the last 168 hours. BNP (last 3 results) No results for input(s): "PROBNP" in the last 8760 hours. HbA1C: Recent Labs    03/02/23 0340  HGBA1C 5.5   CBG: No results for input(s): "GLUCAP" in the last 168 hours. Lipid Profile: Recent Labs    03/02/23 0340  CHOL 194  HDL 55  LDLCALC 127*  TRIG 60  CHOLHDL 3.5   Thyroid Function Tests: No results for input(s): "TSH", "T4TOTAL", "FREET4", "T3FREE", "THYROIDAB" in the last 72 hours. Anemia Panel: Recent Labs    03/01/23 1210  FERRITIN 53  TIBC 262  IRON 25*  RETICCTPCT 1.1   Sepsis Labs: No results for input(s): "PROCALCITON", "LATICACIDVEN" in the last 168 hours.  Recent Results (from the past 240 hour(s))  MRSA Next Gen by PCR, Nasal     Status: None   Collection Time: 03/01/23 11:37 PM   Specimen: Urine, Clean Catch; Nasal Swab  Result Value Ref Range Status   MRSA by PCR Next Gen NOT DETECTED NOT DETECTED Final    Comment: (NOTE) The GeneXpert MRSA Assay (FDA approved for NASAL specimens only), is one component of a comprehensive MRSA colonization surveillance program. It is not intended to diagnose MRSA infection nor to guide or monitor treatment for  MRSA infections. Test performance is not FDA approved in patients less than 66 years old. Performed at [facility], [address] [place], [place] [address]          Radiology Studies: ECHOCARDIOGRAM COMPLETE  Result Date: 03/03/2023    ECHOCARDIOGRAM REPORT   Patient Name:   Shannon Scott Date of Exam: 03/03/2023 Medical Rec #:  [id number]         Height:       61.0 in Accession #:    [id number]        Weight:       147.9 lb Date of Birth:  [birth year]          BSA:          1.662 m Patient Age:    82 years          BP:           130/54 mmHg Patient Gender: F                 HR:  PROGRESS NOTE    Shannon Scott  NWG:[id number] DOB: Mar 09, 1941 DOA: 03/01/2023 PCP: [name], MD   Brief Narrative:  82 y.o. female with hx of CABG x 3, moderate aortic stenosis, locally advanced endometrial cancer status post chemoradiation, immunotherapy, hysterectomy BSO, colonic mass with partial colectomy and colostomy, prior DVT, A-fib on anticoagulation, hypertension, hyperlipidemia, statin intolerance, lymphedema, asthma, hypothyroidism presented with chest pain and dyspnea on exertion.  On presentation, troponins were 509 and 519.  Cardiology was consulted.  She was started on heparin drip. She underwent cardiac catheterization on 03/03/23.  Assessment & Plan:   Non-STEMI in a patient with history of CAD status post CABG x 3 Moderate aortic stenosis -Presented with chest pain and exertional dyspnea.  EKG showed T wave inversion inferior laterally.  High-sensitivity troponins were 509 and 519. -Cardiology following.  She underwent cardiac catheterization on 03/03/23: Cardiology recommending medical management -Continue aspirin, Coreg, ezetimibe.  History of statin intolerance -2D echo showed EF of 55 to 60% with grade 2 diastolic dysfunction with severe aortic valve stenosis  Severe aortic valve stenosis -Structural cardiology team consulted who is ordered pre-TAVR CT scans.  Follow recommendations.  Paroxysmal A-fib -Continue Coreg.  Eliquis has already been resumed.  Hypokalemia -Improved.  History of iron deficiency anemia -Receives IV iron outpatient.  Status post 1 unit packed red cell transfusion on 03/03/2023.  Hemoglobin 10 today.  Monitor hemoglobin.  Received IV iron as well.  Outpatient follow-up with oncology.  Hypertension -Continue Coreg and diltiazem.  Hypothyroidism -continue levothyroxine  History of locally advanced endometrial cancer -Oncology following.  Outpatient oncology follow-up.  Cancer-related pain/chronic pain -Continue  tramadol as needed  Chronic lymphedema -Elevate lower extremities.  Outpatient follow-up  DVT prophylaxis: Heparin drip Code Status: Full Family Communication: Son at bedside Disposition Plan: Status is: Inpatient Remains inpatient appropriate because: Of severity of illness  Consultants: Cardiology  Procedures: None  Antimicrobials: None   Subjective: Patient seen and examined at bedside.  No fever, chest pain or shortness of breath reported.  Feels better today.   Objective: Vitals:   03/04/23 0355 03/04/23 0736 03/04/23 0808 03/04/23 0907  BP: (!) 119/45  (!) 143/52   Pulse: (!) 52 (!) 57 (!) 59 61  Resp: 16  18   Temp: 98.2 F (36.8 C)  98.5 F (36.9 C)   TempSrc: Oral  Oral   SpO2: 91% 92% 96% 96%  Weight: 66.5 kg     Height:        Intake/Output Summary (Last 24 hours) at 03/04/2023 1034 Last data filed at 03/04/2023 0600 Gross per 24 hour  Intake 992.5 ml  Output 1650 ml  Net -657.5 ml   Filed Weights   03/02/23 0447 03/03/23 0423 03/04/23 0355  Weight: 65.8 kg 67.1 kg 66.5 kg    Examination:  General: No acute distress.  Currently on room air.  Elderly female lying in bed.  Looks chronically ill and deconditioned. respiratory: Bilateral decreased breath sounds at bases with scattered crackles CVS: Mild intermittent bradycardia present; S1-S2 heard  abdominal: Soft, nontender, distended mildly; no organomegaly; bowel sounds heard.  Colostomy bag in place extremities: Bilateral lower extremity lymphedema with chronic venous stasis changes present.  No cyanosis      Data Reviewed: I have personally reviewed following labs and imaging studies  CBC: Recent Labs  Lab 03/01/23 1210 03/02/23 0340 03/03/23 0626 03/04/23 0355  WBC 4.6 5.0 7.2 7.5  NEUTROABS 3.4  --  5.5 5.7  HGB 9.0* 8.6* 8.9*

## 2023-03-04 NOTE — Plan of Care (Signed)
  Problem: Education: Goal: Understanding of cardiac disease, CV risk reduction, and recovery process will improve Outcome: Progressing Goal: Individualized Educational Video(s) Outcome: Progressing   Problem: Activity: Goal: Ability to tolerate increased activity will improve Outcome: Progressing   Problem: Cardiac: Goal: Ability to achieve and maintain adequate cardiovascular perfusion will improve Outcome: Progressing   Problem: Health Behavior/Discharge Planning: Goal: Ability to safely manage health-related needs after discharge will improve Outcome: Progressing   Problem: Education: Goal: Knowledge of General Education information will improve Description: Including pain rating scale, medication(s)/side effects and non-pharmacologic comfort measures Outcome: Progressing   Problem: Health Behavior/Discharge Planning: Goal: Ability to manage health-related needs will improve Outcome: Progressing   Problem: Clinical Measurements: Goal: Ability to maintain clinical measurements within normal limits will improve Outcome: Progressing Goal: Will remain free from infection Outcome: Progressing Goal: Diagnostic test results will improve Outcome: Progressing Goal: Respiratory complications will improve Outcome: Progressing Goal: Cardiovascular complication will be avoided Outcome: Progressing   Problem: Activity: Goal: Risk for activity intolerance will decrease Outcome: Progressing   Problem: Nutrition: Goal: Adequate nutrition will be maintained Outcome: Progressing   Problem: Coping: Goal: Level of anxiety will decrease Outcome: Progressing   Problem: Elimination: Goal: Will not experience complications related to bowel motility Outcome: Progressing Goal: Will not experience complications related to urinary retention Outcome: Progressing   Problem: Pain Managment: Goal: General experience of comfort will improve Outcome: Progressing   Problem:  Safety: Goal: Ability to remain free from injury will improve Outcome: Progressing   Problem: Skin Integrity: Goal: Risk for impaired skin integrity will decrease Outcome: Progressing   Problem: Education: Goal: Understanding of CV disease, CV risk reduction, and recovery process will improve Outcome: Progressing Goal: Individualized Educational Video(s) Outcome: Progressing   Problem: Activity: Goal: Ability to return to baseline activity level will improve Outcome: Progressing   Problem: Cardiovascular: Goal: Ability to achieve and maintain adequate cardiovascular perfusion will improve Outcome: Progressing Goal: Vascular access site(s) Level 0-1 will be maintained Outcome: Progressing   Problem: Health Behavior/Discharge Planning: Goal: Ability to safely manage health-related needs after discharge will improve Outcome: Progressing

## 2023-03-04 NOTE — Discharge Summary (Signed)
Physician Discharge Summary  Shannon Scott:[id number] DOB: 1940-06-19 DOA: 03/01/2023  PCP: [name], MD  Admit date: 03/01/2023 Discharge date: 03/04/2023  Admitted From: Home Disposition: Home  Recommendations for Outpatient Follow-up:  Follow up with PCP in 1 week with repeat CBC/BMP Outpatient follow-up with cardiology and oncology Follow up in ED if symptoms worsen or new appear   Home Health: No Equipment/Devices: None  Discharge Condition: Guarded  CODE STATUS: Full Diet recommendation: Heart healthy  Brief/Interim Summary: 82 y.o. female with hx of CABG x 3, moderate aortic stenosis, locally advanced endometrial cancer status post chemoradiation, immunotherapy, hysterectomy BSO, colonic mass with partial colectomy and colostomy, prior DVT, A-fib on anticoagulation, hypertension, hyperlipidemia, statin intolerance, lymphedema, asthma, hypothyroidism presented with chest pain and dyspnea on exertion.  On presentation, troponins were 509 and 519.  Cardiology was consulted.  She was started on heparin drip. She underwent cardiac catheterization on 03/03/23.  Cardiology recommended medical management.  2D echo showed EF of 55 to 60% with grade 2 diastolic dysfunction with severe aortic valve stenosis.  Structural cardiology team ordered pre-TAVR CT scans.  Cardiology has cleared the patient for discharge home today.  Discharge patient home today with outpatient follow-up with PCP and cardiology.  Currently chest pain-free.  Discharge Diagnoses:   Non-STEMI in a patient with history of CAD status post CABG x 3 -Presented with chest pain and exertional dyspnea.  EKG showed T wave inversion inferior laterally.  High-sensitivity troponins were 509 and 519. -Cardiology following.  She underwent cardiac catheterization on 03/03/23: Cardiology recommending medical management -Continue aspirin, Coreg, ezetimibe.  History of statin intolerance -2D echo showed EF of 55 to 60%  with grade 2 diastolic dysfunction with severe aortic valve stenosis -Structural cardiology team ordered pre-TAVR CT scans.  Cardiology has cleared the patient for discharge home today.  Discharge patient home today with outpatient follow-up with PCP and cardiology.  Currently chest pain-free.   Severe aortic valve stenosis -Structural cardiology team ordered pre-TAVR CT scans.  Outpatient follow-up with structural cardiology team.   Paroxysmal A-fib -Continue Coreg.  Eliquis has already been resumed.   Hypokalemia -Improved.   History of iron deficiency anemia -Receives IV iron outpatient.  Status post 1 unit packed red cell transfusion on 03/03/2023.  Hemoglobin 10 today.  Monitor hemoglobin as an outpatient.  Received IV iron as well.  Outpatient follow-up with oncology.   Hypertension -Continue Coreg and diltiazem.   Hypothyroidism -continue levothyroxine   History of locally advanced endometrial cancer -Oncology following.  Outpatient oncology follow-up.   Cancer-related pain/chronic pain -Continue tramadol as needed   Chronic lymphedema -Elevate lower extremities.  Outpatient follow-up   Discharge Instructions  Discharge Instructions     Amb Referral to Cardiac Rehabilitation   Complete by: As directed    Diagnosis: NSTEMI   After initial evaluation and assessments completed: Virtual Based Care may be provided alone or in conjunction with Phase 2 Cardiac Rehab based on patient barriers.: Yes   Intensive Cardiac Rehabilitation ([facility]CR) [facility] location only OR Traditional Cardiac Rehabilitation (TCR) *If criteria for ICR are not met will enroll in TCR [facility] only): Yes   Ambulatory referral to Cardiology   Complete by: As directed    Diet - low sodium heart healthy   Complete by: As directed    Increase activity slowly   Complete by: As directed       Allergies as of 03/04/2023       Reactions   Statins Other (See Comments)  Physician Discharge Summary  Shannon Scott:[id number] DOB: 1940-06-19 DOA: 03/01/2023  PCP: [name], MD  Admit date: 03/01/2023 Discharge date: 03/04/2023  Admitted From: Home Disposition: Home  Recommendations for Outpatient Follow-up:  Follow up with PCP in 1 week with repeat CBC/BMP Outpatient follow-up with cardiology and oncology Follow up in ED if symptoms worsen or new appear   Home Health: No Equipment/Devices: None  Discharge Condition: Guarded  CODE STATUS: Full Diet recommendation: Heart healthy  Brief/Interim Summary: 82 y.o. female with hx of CABG x 3, moderate aortic stenosis, locally advanced endometrial cancer status post chemoradiation, immunotherapy, hysterectomy BSO, colonic mass with partial colectomy and colostomy, prior DVT, A-fib on anticoagulation, hypertension, hyperlipidemia, statin intolerance, lymphedema, asthma, hypothyroidism presented with chest pain and dyspnea on exertion.  On presentation, troponins were 509 and 519.  Cardiology was consulted.  She was started on heparin drip. She underwent cardiac catheterization on 03/03/23.  Cardiology recommended medical management.  2D echo showed EF of 55 to 60% with grade 2 diastolic dysfunction with severe aortic valve stenosis.  Structural cardiology team ordered pre-TAVR CT scans.  Cardiology has cleared the patient for discharge home today.  Discharge patient home today with outpatient follow-up with PCP and cardiology.  Currently chest pain-free.  Discharge Diagnoses:   Non-STEMI in a patient with history of CAD status post CABG x 3 -Presented with chest pain and exertional dyspnea.  EKG showed T wave inversion inferior laterally.  High-sensitivity troponins were 509 and 519. -Cardiology following.  She underwent cardiac catheterization on 03/03/23: Cardiology recommending medical management -Continue aspirin, Coreg, ezetimibe.  History of statin intolerance -2D echo showed EF of 55 to 60%  with grade 2 diastolic dysfunction with severe aortic valve stenosis -Structural cardiology team ordered pre-TAVR CT scans.  Cardiology has cleared the patient for discharge home today.  Discharge patient home today with outpatient follow-up with PCP and cardiology.  Currently chest pain-free.   Severe aortic valve stenosis -Structural cardiology team ordered pre-TAVR CT scans.  Outpatient follow-up with structural cardiology team.   Paroxysmal A-fib -Continue Coreg.  Eliquis has already been resumed.   Hypokalemia -Improved.   History of iron deficiency anemia -Receives IV iron outpatient.  Status post 1 unit packed red cell transfusion on 03/03/2023.  Hemoglobin 10 today.  Monitor hemoglobin as an outpatient.  Received IV iron as well.  Outpatient follow-up with oncology.   Hypertension -Continue Coreg and diltiazem.   Hypothyroidism -continue levothyroxine   History of locally advanced endometrial cancer -Oncology following.  Outpatient oncology follow-up.   Cancer-related pain/chronic pain -Continue tramadol as needed   Chronic lymphedema -Elevate lower extremities.  Outpatient follow-up   Discharge Instructions  Discharge Instructions     Amb Referral to Cardiac Rehabilitation   Complete by: As directed    Diagnosis: NSTEMI   After initial evaluation and assessments completed: Virtual Based Care may be provided alone or in conjunction with Phase 2 Cardiac Rehab based on patient barriers.: Yes   Intensive Cardiac Rehabilitation ([facility]CR) [facility] location only OR Traditional Cardiac Rehabilitation (TCR) *If criteria for ICR are not met will enroll in TCR [facility] only): Yes   Ambulatory referral to Cardiology   Complete by: As directed    Diet - low sodium heart healthy   Complete by: As directed    Increase activity slowly   Complete by: As directed       Allergies as of 03/04/2023       Reactions   Statins Other (See Comments)  Myalgias and memory problems    Ciprofloxacin Itching, Other (See Comments)   Splotchy redness with itching during IV infusion localized to arm. Other reaction(s): itching/swelling/redness at injection site        Medication List     TAKE these medications    apixaban 5 MG Tabs tablet Commonly known as: ELIQUIS Take 1 tablet (5 mg total) by mouth 2 (two) times daily.   aspirin 81 MG chewable tablet Chew 1 tablet (81 mg total) by mouth daily. Start taking on: March 05, 2023   carvedilol 6.25 MG tablet Commonly known as: COREG Take 1 tablet (6.25 mg total) by mouth 2 (two) times daily with a meal.   clobetasol ointment 0.05 % Commonly known as: TEMOVATE Apply 1 Application topically 2 (two) times a week.   diltiazem 300 MG 24 hr capsule Commonly known as: TIAZAC Take 1 capsule (300 mg total) by mouth at bedtime. What changed: See the new instructions.   ezetimibe 10 MG tablet Commonly known as: ZETIA Take 10 mg by mouth daily.   levothyroxine 50 MCG tablet Commonly known as: SYNTHROID Take 1 tablet (50 mcg total) by mouth daily before breakfast.   mometasone 0.1 % cream Commonly known as: ELOCON Apply 1 Application topically daily as needed (to the vulva for irritation and itching).   nitroGLYCERIN 0.4 MG SL tablet Commonly known as: NITROSTAT Place 1 tablet (0.4 mg total) under the tongue every 5 (five) minutes as needed for chest pain.   polyethylene glycol 17 g packet Commonly known as: MIRALAX / GLYCOLAX Take 17 g by mouth daily as needed for severe constipation. What changed: when to take this   Repatha SureClick 140 MG/ML Soaj Generic drug: Evolocumab INJECT 1 DOSE UNDER THE SKIN EVERY 14 DAYS   traMADol 50 MG tablet Commonly known as: ULTRAM TAKE 2 TABLETS(100 MG) BY MOUTH TWICE DAILY        Follow-up Information     [name], MD. Schedule an appointment as soon as possible for a visit in 1 week(s).   Specialty: Internal Medicine Why: with cbc/bmp Contact  information: [address] [place] [address] [phone]                Allergies  Allergen Reactions   Statins Other (See Comments)    Myalgias and memory problems   Ciprofloxacin Itching and Other (See Comments)    Splotchy redness with itching during IV infusion localized to arm. Other reaction(s): itching/swelling/redness at injection site    Consultations: Cardiology/oncology/structural cardiology   Procedures/Studies: ECHOCARDIOGRAM COMPLETE  Result Date: 03/03/2023    ECHOCARDIOGRAM REPORT   Patient Name:   Shannon Scott Date of Exam: 03/03/2023 Medical Rec #:  [id number]         Height:       61.0 in Accession #:    [id number]        Weight:       147.9 lb Date of Birth:  May 11, 1941          BSA:          1.662 m Patient Age:    82 years          BP:           130/54 mmHg Patient Gender: F                 HR:           65 bpm. Exam Location:  Inpatient Procedure: 2D  Physician Discharge Summary  Shannon Scott:[id number] DOB: 1940-06-19 DOA: 03/01/2023  PCP: [name], MD  Admit date: 03/01/2023 Discharge date: 03/04/2023  Admitted From: Home Disposition: Home  Recommendations for Outpatient Follow-up:  Follow up with PCP in 1 week with repeat CBC/BMP Outpatient follow-up with cardiology and oncology Follow up in ED if symptoms worsen or new appear   Home Health: No Equipment/Devices: None  Discharge Condition: Guarded  CODE STATUS: Full Diet recommendation: Heart healthy  Brief/Interim Summary: 82 y.o. female with hx of CABG x 3, moderate aortic stenosis, locally advanced endometrial cancer status post chemoradiation, immunotherapy, hysterectomy BSO, colonic mass with partial colectomy and colostomy, prior DVT, A-fib on anticoagulation, hypertension, hyperlipidemia, statin intolerance, lymphedema, asthma, hypothyroidism presented with chest pain and dyspnea on exertion.  On presentation, troponins were 509 and 519.  Cardiology was consulted.  She was started on heparin drip. She underwent cardiac catheterization on 03/03/23.  Cardiology recommended medical management.  2D echo showed EF of 55 to 60% with grade 2 diastolic dysfunction with severe aortic valve stenosis.  Structural cardiology team ordered pre-TAVR CT scans.  Cardiology has cleared the patient for discharge home today.  Discharge patient home today with outpatient follow-up with PCP and cardiology.  Currently chest pain-free.  Discharge Diagnoses:   Non-STEMI in a patient with history of CAD status post CABG x 3 -Presented with chest pain and exertional dyspnea.  EKG showed T wave inversion inferior laterally.  High-sensitivity troponins were 509 and 519. -Cardiology following.  She underwent cardiac catheterization on 03/03/23: Cardiology recommending medical management -Continue aspirin, Coreg, ezetimibe.  History of statin intolerance -2D echo showed EF of 55 to 60%  with grade 2 diastolic dysfunction with severe aortic valve stenosis -Structural cardiology team ordered pre-TAVR CT scans.  Cardiology has cleared the patient for discharge home today.  Discharge patient home today with outpatient follow-up with PCP and cardiology.  Currently chest pain-free.   Severe aortic valve stenosis -Structural cardiology team ordered pre-TAVR CT scans.  Outpatient follow-up with structural cardiology team.   Paroxysmal A-fib -Continue Coreg.  Eliquis has already been resumed.   Hypokalemia -Improved.   History of iron deficiency anemia -Receives IV iron outpatient.  Status post 1 unit packed red cell transfusion on 03/03/2023.  Hemoglobin 10 today.  Monitor hemoglobin as an outpatient.  Received IV iron as well.  Outpatient follow-up with oncology.   Hypertension -Continue Coreg and diltiazem.   Hypothyroidism -continue levothyroxine   History of locally advanced endometrial cancer -Oncology following.  Outpatient oncology follow-up.   Cancer-related pain/chronic pain -Continue tramadol as needed   Chronic lymphedema -Elevate lower extremities.  Outpatient follow-up   Discharge Instructions  Discharge Instructions     Amb Referral to Cardiac Rehabilitation   Complete by: As directed    Diagnosis: NSTEMI   After initial evaluation and assessments completed: Virtual Based Care may be provided alone or in conjunction with Phase 2 Cardiac Rehab based on patient barriers.: Yes   Intensive Cardiac Rehabilitation ([facility]CR) [facility] location only OR Traditional Cardiac Rehabilitation (TCR) *If criteria for ICR are not met will enroll in TCR [facility] only): Yes   Ambulatory referral to Cardiology   Complete by: As directed    Diet - low sodium heart healthy   Complete by: As directed    Increase activity slowly   Complete by: As directed       Allergies as of 03/04/2023       Reactions   Statins Other (See Comments)  Physician Discharge Summary  Shannon Scott:[id number] DOB: 1940-06-19 DOA: 03/01/2023  PCP: [name], MD  Admit date: 03/01/2023 Discharge date: 03/04/2023  Admitted From: Home Disposition: Home  Recommendations for Outpatient Follow-up:  Follow up with PCP in 1 week with repeat CBC/BMP Outpatient follow-up with cardiology and oncology Follow up in ED if symptoms worsen or new appear   Home Health: No Equipment/Devices: None  Discharge Condition: Guarded  CODE STATUS: Full Diet recommendation: Heart healthy  Brief/Interim Summary: 82 y.o. female with hx of CABG x 3, moderate aortic stenosis, locally advanced endometrial cancer status post chemoradiation, immunotherapy, hysterectomy BSO, colonic mass with partial colectomy and colostomy, prior DVT, A-fib on anticoagulation, hypertension, hyperlipidemia, statin intolerance, lymphedema, asthma, hypothyroidism presented with chest pain and dyspnea on exertion.  On presentation, troponins were 509 and 519.  Cardiology was consulted.  She was started on heparin drip. She underwent cardiac catheterization on 03/03/23.  Cardiology recommended medical management.  2D echo showed EF of 55 to 60% with grade 2 diastolic dysfunction with severe aortic valve stenosis.  Structural cardiology team ordered pre-TAVR CT scans.  Cardiology has cleared the patient for discharge home today.  Discharge patient home today with outpatient follow-up with PCP and cardiology.  Currently chest pain-free.  Discharge Diagnoses:   Non-STEMI in a patient with history of CAD status post CABG x 3 -Presented with chest pain and exertional dyspnea.  EKG showed T wave inversion inferior laterally.  High-sensitivity troponins were 509 and 519. -Cardiology following.  She underwent cardiac catheterization on 03/03/23: Cardiology recommending medical management -Continue aspirin, Coreg, ezetimibe.  History of statin intolerance -2D echo showed EF of 55 to 60%  with grade 2 diastolic dysfunction with severe aortic valve stenosis -Structural cardiology team ordered pre-TAVR CT scans.  Cardiology has cleared the patient for discharge home today.  Discharge patient home today with outpatient follow-up with PCP and cardiology.  Currently chest pain-free.   Severe aortic valve stenosis -Structural cardiology team ordered pre-TAVR CT scans.  Outpatient follow-up with structural cardiology team.   Paroxysmal A-fib -Continue Coreg.  Eliquis has already been resumed.   Hypokalemia -Improved.   History of iron deficiency anemia -Receives IV iron outpatient.  Status post 1 unit packed red cell transfusion on 03/03/2023.  Hemoglobin 10 today.  Monitor hemoglobin as an outpatient.  Received IV iron as well.  Outpatient follow-up with oncology.   Hypertension -Continue Coreg and diltiazem.   Hypothyroidism -continue levothyroxine   History of locally advanced endometrial cancer -Oncology following.  Outpatient oncology follow-up.   Cancer-related pain/chronic pain -Continue tramadol as needed   Chronic lymphedema -Elevate lower extremities.  Outpatient follow-up   Discharge Instructions  Discharge Instructions     Amb Referral to Cardiac Rehabilitation   Complete by: As directed    Diagnosis: NSTEMI   After initial evaluation and assessments completed: Virtual Based Care may be provided alone or in conjunction with Phase 2 Cardiac Rehab based on patient barriers.: Yes   Intensive Cardiac Rehabilitation (ICR) [facility] location only OR Traditional Cardiac Rehabilitation (TCR) *If criteria for ICR are not met will enroll in TCR [facility] only): Yes   Ambulatory referral to Cardiology   Complete by: As directed    Diet - low sodium heart healthy   Complete by: As directed    Increase activity slowly   Complete by: As directed       Allergies as of 03/04/2023       Reactions   Statins Other (See Comments)  Physician Discharge Summary  Shannon Scott:[id number] DOB: 1940-06-19 DOA: 03/01/2023  PCP: [name], MD  Admit date: 03/01/2023 Discharge date: 03/04/2023  Admitted From: Home Disposition: Home  Recommendations for Outpatient Follow-up:  Follow up with PCP in 1 week with repeat CBC/BMP Outpatient follow-up with cardiology and oncology Follow up in ED if symptoms worsen or new appear   Home Health: No Equipment/Devices: None  Discharge Condition: Guarded  CODE STATUS: Full Diet recommendation: Heart healthy  Brief/Interim Summary: 82 y.o. female with hx of CABG x 3, moderate aortic stenosis, locally advanced endometrial cancer status post chemoradiation, immunotherapy, hysterectomy BSO, colonic mass with partial colectomy and colostomy, prior DVT, A-fib on anticoagulation, hypertension, hyperlipidemia, statin intolerance, lymphedema, asthma, hypothyroidism presented with chest pain and dyspnea on exertion.  On presentation, troponins were 509 and 519.  Cardiology was consulted.  She was started on heparin drip. She underwent cardiac catheterization on 03/03/23.  Cardiology recommended medical management.  2D echo showed EF of 55 to 60% with grade 2 diastolic dysfunction with severe aortic valve stenosis.  Structural cardiology team ordered pre-TAVR CT scans.  Cardiology has cleared the patient for discharge home today.  Discharge patient home today with outpatient follow-up with PCP and cardiology.  Currently chest pain-free.  Discharge Diagnoses:   Non-STEMI in a patient with history of CAD status post CABG x 3 -Presented with chest pain and exertional dyspnea.  EKG showed T wave inversion inferior laterally.  High-sensitivity troponins were 509 and 519. -Cardiology following.  She underwent cardiac catheterization on 03/03/23: Cardiology recommending medical management -Continue aspirin, Coreg, ezetimibe.  History of statin intolerance -2D echo showed EF of 55 to 60%  with grade 2 diastolic dysfunction with severe aortic valve stenosis -Structural cardiology team ordered pre-TAVR CT scans.  Cardiology has cleared the patient for discharge home today.  Discharge patient home today with outpatient follow-up with PCP and cardiology.  Currently chest pain-free.   Severe aortic valve stenosis -Structural cardiology team ordered pre-TAVR CT scans.  Outpatient follow-up with structural cardiology team.   Paroxysmal A-fib -Continue Coreg.  Eliquis has already been resumed.   Hypokalemia -Improved.   History of iron deficiency anemia -Receives IV iron outpatient.  Status post 1 unit packed red cell transfusion on 03/03/2023.  Hemoglobin 10 today.  Monitor hemoglobin as an outpatient.  Received IV iron as well.  Outpatient follow-up with oncology.   Hypertension -Continue Coreg and diltiazem.   Hypothyroidism -continue levothyroxine   History of locally advanced endometrial cancer -Oncology following.  Outpatient oncology follow-up.   Cancer-related pain/chronic pain -Continue tramadol as needed   Chronic lymphedema -Elevate lower extremities.  Outpatient follow-up   Discharge Instructions  Discharge Instructions     Amb Referral to Cardiac Rehabilitation   Complete by: As directed    Diagnosis: NSTEMI   After initial evaluation and assessments completed: Virtual Based Care may be provided alone or in conjunction with Phase 2 Cardiac Rehab based on patient barriers.: Yes   Intensive Cardiac Rehabilitation (ICR) [facility] location only OR Traditional Cardiac Rehabilitation (TCR) *If criteria for ICR are not met will enroll in TCR [facility] only): Yes   Ambulatory referral to Cardiology   Complete by: As directed    Diet - low sodium heart healthy   Complete by: As directed    Increase activity slowly   Complete by: As directed       Allergies as of 03/04/2023       Reactions   Statins Other (See Comments)  Physician Discharge Summary  Shannon Scott:[id number] DOB: 1940-06-19 DOA: 03/01/2023  PCP: [name], MD  Admit date: 03/01/2023 Discharge date: 03/04/2023  Admitted From: Home Disposition: Home  Recommendations for Outpatient Follow-up:  Follow up with PCP in 1 week with repeat CBC/BMP Outpatient follow-up with cardiology and oncology Follow up in ED if symptoms worsen or new appear   Home Health: No Equipment/Devices: None  Discharge Condition: Guarded  CODE STATUS: Full Diet recommendation: Heart healthy  Brief/Interim Summary: 82 y.o. female with hx of CABG x 3, moderate aortic stenosis, locally advanced endometrial cancer status post chemoradiation, immunotherapy, hysterectomy BSO, colonic mass with partial colectomy and colostomy, prior DVT, A-fib on anticoagulation, hypertension, hyperlipidemia, statin intolerance, lymphedema, asthma, hypothyroidism presented with chest pain and dyspnea on exertion.  On presentation, troponins were 509 and 519.  Cardiology was consulted.  She was started on heparin drip. She underwent cardiac catheterization on 03/03/23.  Cardiology recommended medical management.  2D echo showed EF of 55 to 60% with grade 2 diastolic dysfunction with severe aortic valve stenosis.  Structural cardiology team ordered pre-TAVR CT scans.  Cardiology has cleared the patient for discharge home today.  Discharge patient home today with outpatient follow-up with PCP and cardiology.  Currently chest pain-free.  Discharge Diagnoses:   Non-STEMI in a patient with history of CAD status post CABG x 3 -Presented with chest pain and exertional dyspnea.  EKG showed T wave inversion inferior laterally.  High-sensitivity troponins were 509 and 519. -Cardiology following.  She underwent cardiac catheterization on 03/03/23: Cardiology recommending medical management -Continue aspirin, Coreg, ezetimibe.  History of statin intolerance -2D echo showed EF of 55 to 60%  with grade 2 diastolic dysfunction with severe aortic valve stenosis -Structural cardiology team ordered pre-TAVR CT scans.  Cardiology has cleared the patient for discharge home today.  Discharge patient home today with outpatient follow-up with PCP and cardiology.  Currently chest pain-free.   Severe aortic valve stenosis -Structural cardiology team ordered pre-TAVR CT scans.  Outpatient follow-up with structural cardiology team.   Paroxysmal A-fib -Continue Coreg.  Eliquis has already been resumed.   Hypokalemia -Improved.   History of iron deficiency anemia -Receives IV iron outpatient.  Status post 1 unit packed red cell transfusion on 03/03/2023.  Hemoglobin 10 today.  Monitor hemoglobin as an outpatient.  Received IV iron as well.  Outpatient follow-up with oncology.   Hypertension -Continue Coreg and diltiazem.   Hypothyroidism -continue levothyroxine   History of locally advanced endometrial cancer -Oncology following.  Outpatient oncology follow-up.   Cancer-related pain/chronic pain -Continue tramadol as needed   Chronic lymphedema -Elevate lower extremities.  Outpatient follow-up   Discharge Instructions  Discharge Instructions     Amb Referral to Cardiac Rehabilitation   Complete by: As directed    Diagnosis: NSTEMI   After initial evaluation and assessments completed: Virtual Based Care may be provided alone or in conjunction with Phase 2 Cardiac Rehab based on patient barriers.: Yes   Intensive Cardiac Rehabilitation (ICR) [facility] location only OR Traditional Cardiac Rehabilitation (TCR) *If criteria for ICR are not met will enroll in TCR [facility] only): Yes   Ambulatory referral to Cardiology   Complete by: As directed    Diet - low sodium heart healthy   Complete by: As directed    Increase activity slowly   Complete by: As directed       Allergies as of 03/04/2023       Reactions   Statins Other (See Comments)

## 2023-03-04 NOTE — Consult Note (Signed)
Value-Based Care Institute  [facility] Inpatient Consult   03/04/2023  Shannon Scott 05-20-1941 [id number]  [facility] [[facility]]  Accountable Care Organization [ACO] Patient: [employer] Medicare  Primary Care Provider: [name], MD with Deboraha Sprang at [place], this provider is listed for the transition of care follow up appointments and calls   [facility] Liaison following for high risk patient admitted to [facility]    The patient was screened for  day readmission hospitalization with noted high risk score for unplanned readmission risk..  The patient was assessed for potential [facility]) Care Management service needs for post hospital transition for care coordination. Review of patient's electronic medical record reveals patient is admitted with NSTEMI.  Patient is from home alone. Came to room and patient in patient care currently.  Plan: [facility] Liaison will continue to follow progress and disposition to asess for post hospital community care coordination/management needs.  Referral request for community care coordination: anticipate follow up with [facility] transition team post hospital.   [facility] Management/Population Health does not replace or interfere with any arrangements made by the Inpatient Transition of Care team.   For questions contact:   Charlesetta Shanks, RN, BSN, CCM [place]  [facility], [facility] Liaison Direct Dial: [phone] or secure chat Website: [email or web address]

## 2023-03-04 NOTE — Progress Notes (Signed)
Pt eating lunch in bed, declined ambulation   Pt was educated on wt restrictions, no baths/daily wash-ups, s/s of infection, ex guidelines, s/s to stop exercising, NTG use and calling 911, heart healthy diet, risk factors and CRPII. Pt received MI book and materials on exercise, diet, and CRPII. Will refer to [facility].   Faustino Congress 03/04/2023 12:28 PM

## 2023-03-04 NOTE — Progress Notes (Signed)
Rounding Note    Patient Name: Shannon Scott Date of Encounter: 03/04/2023  [facility] Cardiologist: Chrystie Nose, MD   Subjective   No chest pain or shortness of breath  Inpatient Medications    Scheduled Meds:  apixaban  5 mg Oral BID   aspirin  81 mg Oral Daily   carvedilol  6.25 mg Oral BID WC   Chlorhexidine Gluconate Cloth  6 each Topical Daily   diltiazem  300 mg Oral QHS   ezetimibe  10 mg Oral Daily   levothyroxine  50 mcg Oral QAC breakfast   potassium chloride  40 mEq Oral Once   sodium chloride flush  10-40 mL Intracatheter Q12H   sodium chloride flush  3 mL Intravenous Q12H   Continuous Infusions:  sodium chloride     iron sucrose Stopped (03/03/23 2022)   PRN Meds: sodium chloride, acetaminophen, ondansetron (ZOFRAN) IV, polyethylene glycol, sodium chloride flush, traMADol   Vital Signs    Vitals:   03/04/23 0355 03/04/23 0736 03/04/23 0808 03/04/23 0907  BP: (!) 119/45  (!) 143/52   Pulse: (!) 52 (!) 57 (!) 59 61  Resp: 16  18   Temp: 98.2 F (36.8 C)  98.5 F (36.9 C)   TempSrc: Oral  Oral   SpO2: 91% 92% 96% 96%  Weight: 66.5 kg     Height:        Intake/Output Summary (Last 24 hours) at 03/04/2023 1148 Last data filed at 03/04/2023 0600 Gross per 24 hour  Intake 617.83 ml  Output 1650 ml  Net -1032.17 ml      03/04/2023    3:55 AM 03/03/2023    4:23 AM 03/02/2023    4:47 AM  Last 3 Weights  Weight (lbs) 146 lb 9.7 oz 147 lb 14.9 oz 145 lb  Weight (kg) 66.5 kg 67.1 kg 65.772 kg      Telemetry    Sinus rhythm- Personally Reviewed  ECG    Not performed today- Personally Reviewed  Physical Exam   GEN: No acute distress.   Neck: No JVD Cardiac: RRR, 2/6 outflow tract murmur consistent with aortic stenosis rubs, or gallops.  Respiratory: Clear to auscultation bilaterally. GI: Soft, nontender, non-distended  MS: No edema; No deformity. Neuro:  Nonfocal  Psych: Normal affect   Labs    High Sensitivity  Troponin:   Recent Labs  Lab 03/01/23 1428 03/01/23 1703 03/02/23 0023  TROPONINIHS 509* 519* 628*     Chemistry Recent Labs  Lab 03/01/23 1210 03/02/23 0340 03/03/23 0626 03/04/23 0355  NA 140 142 136 135  K 3.8 3.1* 4.0 3.7  CL 104 104 106 102  CO2 27 24 24 26   GLUCOSE 101* 106* 112* 121*  BUN 11 8 7* 10  CREATININE 0.82 0.71 0.94 0.98  CALCIUM 8.9 8.9 8.4* 8.1*  MG  --  1.8 2.1 2.0  PROT 6.8  --   --   --   ALBUMIN 3.8  --   --   --   AST 13*  --   --   --   ALT 5  --   --   --   ALKPHOS 80  --   --   --   BILITOT 0.3  --   --   --   GFRNONAA >60 >60 >60 58*  ANIONGAP 9 14 6 7     Lipids  Recent Labs  Lab 03/02/23 0340  CHOL 194  TRIG 60  HDL  55  LDLCALC 127*  CHOLHDL 3.5    Hematology Recent Labs  Lab 03/02/23 0340 03/03/23 0626 03/04/23 0355  WBC 5.0 7.2 7.5  RBC 3.34* 3.51* 3.74*  HGB 8.6* 8.9* 10.0*  HCT 27.7* 29.2* 32.0*  MCV 82.9 83.2 85.6  MCH 25.7* 25.4* 26.7  MCHC 31.0 30.5 31.3  RDW 15.5 15.8* 15.5  PLT 283 304 290   Thyroid No results for input(s): "TSH", "FREET4" in the last 168 hours.  BNPNo results for input(s): "BNP", "PROBNP" in the last 168 hours.  DDimer  Recent Labs  Lab 03/01/23 1428  DDIMER 0.78*     Radiology    ECHOCARDIOGRAM COMPLETE  Result Date: 03/03/2023    ECHOCARDIOGRAM REPORT   Patient Name:   Shannon Scott Date of Exam: 03/03/2023 Medical Rec #:  [id number]         Height:       61.0 in Accession #:    [id number]        Weight:       147.9 lb Date of Birth:  1940-12-17          BSA:          1.662 m Patient Age:    82 years          BP:           130/54 mmHg Patient Gender: F                 HR:           65 bpm. Exam Location:  Inpatient Procedure: 2D Echo, Cardiac Doppler and Color Doppler Indications:    NSTEMI                 Aortic stenosis  History:        Patient has prior history of Echocardiogram examinations, most                 recent 03/02/2022. CAD, Prior CABG, Carotid Disease and Stroke,                  Aortic Valve Disease, Arrythmias:Atrial Fibrillation; Risk                 Factors:Hypertension and Dyslipidemia. Cancer.  Sonographer:    Milda Smart Referring Phys: [id number] Omero Kowal SEGARS  Sonographer Comments: Image acquisition challenging due to patient body habitus and Image acquisition challenging due to respiratory motion. IMPRESSIONS  1. Left ventricular ejection fraction, by estimation, is 55 to 60%. The left ventricle has normal function. The left ventricle has no regional wall motion abnormalities. There is mild concentric left ventricular hypertrophy. Left ventricular diastolic parameters are consistent with Grade II diastolic dysfunction (pseudonormalization).  2. Right ventricular systolic function is low normal. The right ventricular size is normal. There is normal pulmonary artery systolic pressure. The estimated right ventricular systolic pressure is 24.7 mmHg.  3. Left atrial size was mildly dilated.  4. Right atrial size was mildly dilated.  5. The mitral valve is degenerative. Mild to moderate mitral valve regurgitation. No evidence of mitral stenosis. Moderate to severe mitral annular calcification.  6. Tricuspid valve regurgitation is moderate.  7. The aortic valve is tricuspid. There is moderate calcification of the aortic valve. Aortic valve regurgitation is trivial. Suspect severe paradoxical low flow/low gradient aortic valve stenosis. Aortic valve area, by VTI measures 0.79 cm. Aortic valve mean gradient measures 24.0 mmHg.  8. The inferior vena cava is normal in size with greater than  50% respiratory variability, suggesting right atrial pressure of 3 mmHg. FINDINGS  Left Ventricle: Left ventricular ejection fraction, by estimation, is 55 to 60%. The left ventricle has normal function. The left ventricle has no regional wall motion abnormalities. The left ventricular internal cavity size was normal in size. There is  mild concentric left ventricular hypertrophy. Left  ventricular diastolic parameters are consistent with Grade II diastolic dysfunction (pseudonormalization). Right Ventricle: The right ventricular size is normal. No increase in right ventricular wall thickness. Right ventricular systolic function is low normal. There is normal pulmonary artery systolic pressure. The tricuspid regurgitant velocity is 2.33 m/s,  and with an assumed right atrial pressure of 3 mmHg, the estimated right ventricular systolic pressure is 24.7 mmHg. Left Atrium: Left atrial size was mildly dilated. Right Atrium: Right atrial size was mildly dilated. Pericardium: There is no evidence of pericardial effusion. Mitral Valve: The mitral valve is degenerative in appearance. There is moderate calcification of the mitral valve leaflet(s). Moderate to severe mitral annular calcification. Mild to moderate mitral valve regurgitation. No evidence of mitral valve stenosis. MV peak gradient, 7.8 mmHg. The mean mitral valve gradient is 2.0 mmHg with average heart rate of 64 bpm. Tricuspid Valve: The tricuspid valve is normal in structure. Tricuspid valve regurgitation is moderate. Aortic Valve: The aortic valve is tricuspid. There is moderate calcification of the aortic valve. Aortic valve regurgitation is trivial. Aortic regurgitation PHT measures 436 msec. Severe aortic stenosis is present. Aortic valve mean gradient measures 24.0 mmHg. Aortic valve peak gradient measures 32.3 mmHg. Aortic valve area, by VTI measures 0.79 cm. Pulmonic Valve: The pulmonic valve was normal in structure. Pulmonic valve regurgitation is trivial. Aorta: The aortic root is normal in size and structure. Venous: The inferior vena cava is normal in size with greater than 50% respiratory variability, suggesting right atrial pressure of 3 mmHg. IAS/Shunts: No atrial level shunt detected by color flow Doppler.  LEFT VENTRICLE PLAX 2D LVIDd:         3.80 cm     Diastology LVIDs:         2.70 cm     LV e' medial:    5.44 cm/s LV PW:          0.80 cm     LV E/e' medial:  25.2 LV IVS:        0.90 cm     LV e' lateral:   6.31 cm/s LVOT diam:     1.80 cm     LV E/e' lateral: 21.7 LV SV:         56 LV SV Index:   34 LVOT Area:     2.54 cm  LV Volumes (MOD) LV vol d, MOD A2C: 62.8 ml LV vol d, MOD A4C: 72.3 ml LV vol s, MOD A2C: 21.9 ml LV vol s, MOD A4C: 27.8 ml LV SV MOD A2C:     40.9 ml LV SV MOD A4C:     72.3 ml LV SV MOD BP:      45.0 ml RIGHT VENTRICLE            IVC RV Basal diam:  3.50 cm    IVC diam: 1.60 cm RV S prime:     6.31 cm/s TAPSE (M-mode): 1.8 cm LEFT ATRIUM           Index        RIGHT ATRIUM           Index LA diam:  4.30 cm 2.59 cm/m   RA Area:     21.70 cm LA Vol (A4C): 63.6 ml 38.28 ml/m  RA Volume:   68.00 ml  40.93 ml/m  AORTIC VALVE AV Area (Vmax):    0.78 cm AV Area (Vmean):   0.72 cm AV Area (VTI):     0.79 cm AV Vmax:           284.00 cm/s AV Vmean:          213.333 cm/s AV VTI:            0.705 m AV Peak Grad:      32.3 mmHg AV Mean Grad:      24.0 mmHg LVOT Vmax:         86.90 cm/s LVOT Vmean:        60.200 cm/s LVOT VTI:          0.220 m LVOT/AV VTI ratio: 0.31 AI PHT:            436 msec  AORTA Ao Root diam: 2.90 cm Ao Asc diam:  2.90 cm MITRAL VALVE                TRICUSPID VALVE MV Area (PHT): 3.42 cm     TR Peak grad:   21.7 mmHg MV Area VTI:   1.55 cm     TR Mean grad:   16.0 mmHg MV Peak grad:  7.8 mmHg     TR Vmax:        233.00 cm/s MV Mean grad:  2.0 mmHg     TR Vmean:       194.0 cm/s MV Vmax:       1.40 m/s MV Vmean:      65.0 cm/s    SHUNTS MV Decel Time: 222 msec     Systemic VTI:  0.22 m MR Peak grad: 101.6 mmHg    Systemic Diam: 1.80 cm MR Mean grad: 77.0 mmHg MR Vmax:      504.00 cm/s MR Vmean:     424.0 cm/s MV E velocity: 137.00 cm/s MV A velocity: 42.80 cm/s MV E/A ratio:  3.20 Dalton McleanMD Electronically signed by Wilfred Lacy Signature Date/Time: 03/03/2023/9:23:51 AM    Final     Cardiac Studies   2D echocardiogram (03/03/2023)  IMPRESSIONS     1. Left ventricular  ejection fraction, by estimation, is 55 to 60%. The  left ventricle has normal function. The left ventricle has no regional  wall motion abnormalities. There is mild concentric left ventricular  hypertrophy. Left ventricular diastolic  parameters are consistent with Grade II diastolic dysfunction  (pseudonormalization).   2. Right ventricular systolic function is low normal. The right  ventricular size is normal. There is normal pulmonary artery systolic  pressure. The estimated right ventricular systolic pressure is 24.7 mmHg.   3. Left atrial size was mildly dilated.   4. Right atrial size was mildly dilated.   5. The mitral valve is degenerative. Mild to moderate mitral valve  regurgitation. No evidence of mitral stenosis. Moderate to severe mitral  annular calcification.   6. Tricuspid valve regurgitation is moderate.   7. The aortic valve is tricuspid. There is moderate calcification of the  aortic valve. Aortic valve regurgitation is trivial. Suspect severe  paradoxical low flow/low gradient aortic valve stenosis. Aortic valve  area, by VTI measures 0.79 cm. Aortic  valve mean gradient measures 24.0 mmHg.   8. The inferior vena cava is normal in size with greater  than 50%  respiratory variability, suggesting right atrial pressure of 3 mmHg.   Cardiac catheterization (03/02/2023)  Left Heart Catheterization 03/02/23:  Aortic valve was not crossed hence LV EDP not evaluated.   Angiographic data: LM: Diffusely diseased with diffuse 40 to 60% stenosis and continues to the same size into the LAD.   LAD: Proximal LAD has a 50 to 60% stenosis gives origin to a relatively large D1 which is again diffusely diseased but without any focal high-grade stenosis.  After the origin of D1, LAD has a focal 95% stenosis.  Competitive filling from the LIMA noted. LCx: Large vessel, proximal vessel is diffusely diseased and mildly ectatic.  It is occluded in the midsegment after origin of a very  small OM1.  There is a very large OM 2 with secondary branch supplied by saphenous vein graft. RCA: Dominant, occluded in the midsegment.  Diffusely diseased proximally.   LIMA to LAD: Widely patent. SVG to RCA: Patent with tandem 70% stenosis, mild ectasia noted in the midsegment of the body of the saphenous vein graft.  TIMI-3 flow is evident. 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  The aneurysms extend throughout the mid and distal segment of the graft probably aneurysmal length is about 60 to 80 mm.                                                                Recommendation: Patient is very frail 82 year old patient with severe anemia, would recommend outpatient GI evaluation to exclude any obvious GI source of bleeding.  Continue Eliquis for now and also consider adding 81 mg of aspirin already on Plavix only if cleared by GI.   I will discuss with my interventional cardiology colleagues regarding management options but given her frail nature, severe anemia, long aneurysmal segment and risk involving the SVG to OM 2 which is probably the culprit for her NSTEMI will be very difficult to treat with covered stents requiring long-term DAPT and attendant risk of bleeding complications.  Probably medical therapy for now and can be discharged with outpatient follow-up.  Patient Profile     Shannon Scott is a 82 y.o. female with a hx of CAD s/p 3v CABG who is being seen 03/02/2023 for the evaluation of chest pain.   Assessment & Plan    1: Non-STEMI-troponins elevated but flat in the 5-600 range.  EKG showed slight ST segment depression in the anterolateral leads.  Cardiac cath showed severe native vessel disease with a degenerated circumflex obtuse marginal graft and RCA graft.  The LIMA was patent.  There were no percutaneous options.  Medical  therapy was recommended.  2: Essential hypertension-blood pressure stable at 140/50.  On carvedilol and diltiazem.  3: PAF-was on Eliquis as an outpatient.  This can be restarted  4: Severe AS-low-flow/low gradient with a valve area of 0.79 cm.  She is minimally symptomatic at home however.  We did get a structural heart consult who felt that she was a "marginal candidate" for TAVR.  TAVR CT protocol was performed this morning.  At this point, given her minimal symptoms I  do not feel compelled to pursue this.  Patient did receive blood transfusion per Dr. Myna Hidalgo, her hematologist/oncologist.  Her hemoglobin did increase to 10.  She has remained asymptomatic.  I feel comfortable allowing her to go home.  We will arrange TOC 7 and follow-up with Dr. Rennis Golden.  [facility] will sign off.   Medication Recommendations: None Other recommendations (labs, testing, etc): None Follow up as an outpatient: TOC 7, return office visit with Dr. Rennis Golden.  For questions or updates, please contact [facility] Please consult [email or web address] for contact info under        Signed, Nanetta Batty, MD  03/04/2023, 11:48 AM

## 2023-03-04 NOTE — TOC Transition Note (Signed)
Transition of Care (TOC) - CM/SW Discharge Note For dc today, has no needs.   Patient Details  Name: Shannon Scott MRN: [id number] Date of Birth: 1940/08/14  Transition of Care [facility]) CM/SW Contact:  Leone Haven, RN Phone Number: 03/04/2023, 1:18 PM   Clinical Narrative:         Barriers to Discharge: Continued Medical Work up   Patient Goals and CMS Choice   Choice offered to / list presented to : NA  Discharge Placement                         Discharge Plan and Services Additional resources added to the After Visit Summary for   In-house Referral: NA Discharge Planning Services: CM Consult Post Acute Care Choice: NA          DME Arranged: N/A DME Agency: NA       HH Arranged: NA          Social Determinants of Health (SDOH) Interventions SDOH Screenings   Food Insecurity: No Food Insecurity (03/01/2023)  Housing: Low Risk  (03/01/2023)  Transportation Needs: No Transportation Needs (03/01/2023)  Utilities: Not At Risk (03/01/2023)  Tobacco Use: Low Risk  (03/01/2023)     Readmission Risk Interventions     No data to display

## 2023-03-04 NOTE — Progress Notes (Signed)
  HEART [facility] VASCULAR CENTER   MULTIDISCIPLINARY HEART VALVE TEAM   Patient seen in structural heart consult yesterday with plans to move forward with pre-TAVR CT. Imaging performed today with results pending. Uploaded to [facility] and [employer] for further review. Plan to review case with multi-disciplinary team next Tuesday, 03/08/23 for more final plan.   Georgie Chard NP-C Structural Heart Team  Pager: [phone] Phone: [phone]

## 2023-03-04 NOTE — Progress Notes (Signed)
Patient discharged via wheelchair in stable condition.

## 2023-03-04 NOTE — Care Management Important Message (Signed)
Important Message  Patient Details  Name: Shannon Scott MRN: [id number] Date of Birth: July 13, 1940   Important Message Given:  Yes - Medicare IM     Dorena Bodo 03/04/2023, 2:18 PM

## 2023-03-07 ENCOUNTER — Telehealth: Payer: Self-pay | Admitting: *Deleted

## 2023-03-07 NOTE — Telephone Encounter (Signed)
Spoke with Shannon Scott for [employer] call and reminded of appt. On Friday, October 11 th with Dr. Pricilla Holm. Pt states she had a heart attack three days ago, she is home recovering and wants to change her appt. Pt was given an appointment on Friday, December 13 th. At 4:15 pm. Pt agreed to date and time and had no further concerns or questions at this time.

## 2023-03-08 ENCOUNTER — Telehealth (HOSPITAL_COMMUNITY): Payer: Self-pay

## 2023-03-08 ENCOUNTER — Telehealth: Payer: Self-pay

## 2023-03-08 NOTE — Telephone Encounter (Signed)
Pt insurance is active and benefits verified through [employer] [facility] [employer] [facility] Medicare. Co-pay $0.00, DED $0.00/$0.00 met, out of pocket $3,600.00/$1,079.22 met, co-insurance 0%. No pre-authorization required. [employer]port, 03/08/23 @ 1:35PM, REF#[id number]   How many CR sessions are covered? (36 visits for TCR, 72 visits for ICR)72 Is this a lifetime maximum or an annual maximum? Annual Has the member used any of these services to date? No Is there a time limit (weeks/months) on start of program and/or program completion? No     Will contact patient to see if she is interested in the Cardiac Rehab Program. If interested, patient will need to complete follow up appt. Once completed, patient will be contacted for scheduling upon review by the RN Navigator.

## 2023-03-08 NOTE — Telephone Encounter (Signed)
Called and spoke with pt in regards to CR, pt stated he is not interested at this time.   Closed referral 

## 2023-03-08 NOTE — Telephone Encounter (Signed)
Pt called stating she originally had an appointment with Dr. Pricilla Holm on 10/11 but she rescheduled d/t being in the hospital.  She called today to say she thinks she needs to be seen sooner than December. She was recently in the hospital and d/t sweating and staying wet she has vaginal redness and soreness on outside. 5/10 on pain scale. The nurse at the hospital gave her barrier cream, it helped some but not very much. No bleeding.   Per Warner Mccreedy NP, there is an opening tomorrow with Dr.Jackson-Moore.  Pt agreed to date of 03/09/23 @ 1:30.

## 2023-03-09 ENCOUNTER — Ambulatory Visit: Payer: Medicare Other | Admitting: Obstetrics & Gynecology

## 2023-03-09 ENCOUNTER — Other Ambulatory Visit: Payer: Self-pay

## 2023-03-09 ENCOUNTER — Inpatient Hospital Stay (HOSPITAL_COMMUNITY): Payer: Medicare Other

## 2023-03-09 ENCOUNTER — Encounter (HOSPITAL_COMMUNITY): Payer: Self-pay | Admitting: Emergency Medicine

## 2023-03-09 ENCOUNTER — Emergency Department (HOSPITAL_COMMUNITY): Payer: Medicare Other

## 2023-03-09 ENCOUNTER — Inpatient Hospital Stay (HOSPITAL_COMMUNITY)
Admission: EM | Disposition: A | Discharge status: Home Health | Payer: Self-pay | Source: Home / Self Care | Attending: Internal Medicine

## 2023-03-09 ENCOUNTER — Inpatient Hospital Stay (HOSPITAL_COMMUNITY)
Admission: EM | Admit: 2023-03-09 | Discharge: 2023-03-15 | DRG: 321 | Disposition: A | Discharge status: Home Health | Payer: Medicare Other | Attending: Internal Medicine | Admitting: Internal Medicine

## 2023-03-09 ENCOUNTER — Telehealth: Payer: Self-pay | Admitting: *Deleted

## 2023-03-09 DIAGNOSIS — I25119 Atherosclerotic heart disease of native coronary artery with unspecified angina pectoris: Secondary | ICD-10-CM | POA: Diagnosis present

## 2023-03-09 DIAGNOSIS — Z841 Family history of disorders of kidney and ureter: Secondary | ICD-10-CM

## 2023-03-09 DIAGNOSIS — Z823 Family history of stroke: Secondary | ICD-10-CM

## 2023-03-09 DIAGNOSIS — Z933 Colostomy status: Secondary | ICD-10-CM | POA: Diagnosis not present

## 2023-03-09 DIAGNOSIS — E7801 Familial hypercholesterolemia: Secondary | ICD-10-CM | POA: Diagnosis present

## 2023-03-09 DIAGNOSIS — I890 Lymphedema, not elsewhere classified: Secondary | ICD-10-CM | POA: Diagnosis not present

## 2023-03-09 DIAGNOSIS — Z789 Other specified health status: Secondary | ICD-10-CM

## 2023-03-09 DIAGNOSIS — I2111 ST elevation (STEMI) myocardial infarction involving right coronary artery: Secondary | ICD-10-CM | POA: Diagnosis present

## 2023-03-09 DIAGNOSIS — Z7982 Long term (current) use of aspirin: Secondary | ICD-10-CM

## 2023-03-09 DIAGNOSIS — Z8249 Family history of ischemic heart disease and other diseases of the circulatory system: Secondary | ICD-10-CM

## 2023-03-09 DIAGNOSIS — E039 Hypothyroidism, unspecified: Secondary | ICD-10-CM | POA: Diagnosis not present

## 2023-03-09 DIAGNOSIS — Z7189 Other specified counseling: Secondary | ICD-10-CM | POA: Diagnosis not present

## 2023-03-09 DIAGNOSIS — Z743 Need for continuous supervision: Secondary | ICD-10-CM | POA: Diagnosis not present

## 2023-03-09 DIAGNOSIS — I358 Other nonrheumatic aortic valve disorders: Secondary | ICD-10-CM | POA: Diagnosis present

## 2023-03-09 DIAGNOSIS — Z79899 Other long term (current) drug therapy: Secondary | ICD-10-CM

## 2023-03-09 DIAGNOSIS — I238 Other current complications following acute myocardial infarction: Secondary | ICD-10-CM | POA: Diagnosis present

## 2023-03-09 DIAGNOSIS — T82898A Other specified complication of vascular prosthetic devices, implants and grafts, initial encounter: Principal | ICD-10-CM | POA: Diagnosis present

## 2023-03-09 DIAGNOSIS — Z90722 Acquired absence of ovaries, bilateral: Secondary | ICD-10-CM

## 2023-03-09 DIAGNOSIS — I480 Paroxysmal atrial fibrillation: Secondary | ICD-10-CM | POA: Diagnosis not present

## 2023-03-09 DIAGNOSIS — I5032 Chronic diastolic (congestive) heart failure: Secondary | ICD-10-CM | POA: Diagnosis present

## 2023-03-09 DIAGNOSIS — I249 Acute ischemic heart disease, unspecified: Secondary | ICD-10-CM | POA: Diagnosis not present

## 2023-03-09 DIAGNOSIS — I721 Aneurysm of artery of upper extremity: Secondary | ICD-10-CM | POA: Diagnosis not present

## 2023-03-09 DIAGNOSIS — I25810 Atherosclerosis of coronary artery bypass graft(s) without angina pectoris: Secondary | ICD-10-CM | POA: Diagnosis not present

## 2023-03-09 DIAGNOSIS — I25709 Atherosclerosis of coronary artery bypass graft(s), unspecified, with unspecified angina pectoris: Secondary | ICD-10-CM | POA: Diagnosis not present

## 2023-03-09 DIAGNOSIS — J45909 Unspecified asthma, uncomplicated: Secondary | ICD-10-CM | POA: Diagnosis not present

## 2023-03-09 DIAGNOSIS — E78019 Familial hypercholesterolemia, unspecified: Secondary | ICD-10-CM | POA: Diagnosis present

## 2023-03-09 DIAGNOSIS — M542 Cervicalgia: Secondary | ICD-10-CM | POA: Diagnosis not present

## 2023-03-09 DIAGNOSIS — Z79890 Hormone replacement therapy (postmenopausal): Secondary | ICD-10-CM | POA: Diagnosis not present

## 2023-03-09 DIAGNOSIS — E7800 Pure hypercholesterolemia, unspecified: Secondary | ICD-10-CM | POA: Diagnosis not present

## 2023-03-09 DIAGNOSIS — Z7901 Long term (current) use of anticoagulants: Secondary | ICD-10-CM

## 2023-03-09 DIAGNOSIS — Z888 Allergy status to other drugs, medicaments and biological substances status: Secondary | ICD-10-CM

## 2023-03-09 DIAGNOSIS — I729 Aneurysm of unspecified site: Secondary | ICD-10-CM | POA: Insufficient documentation

## 2023-03-09 DIAGNOSIS — Z881 Allergy status to other antibiotic agents status: Secondary | ICD-10-CM

## 2023-03-09 DIAGNOSIS — Y832 Surgical operation with anastomosis, bypass or graft as the cause of abnormal reaction of the patient, or of later complication, without mention of misadventure at the time of the procedure: Secondary | ICD-10-CM | POA: Diagnosis present

## 2023-03-09 DIAGNOSIS — D509 Iron deficiency anemia, unspecified: Secondary | ICD-10-CM | POA: Diagnosis not present

## 2023-03-09 DIAGNOSIS — D6859 Other primary thrombophilia: Secondary | ICD-10-CM | POA: Diagnosis not present

## 2023-03-09 DIAGNOSIS — Z951 Presence of aortocoronary bypass graft: Secondary | ICD-10-CM | POA: Diagnosis not present

## 2023-03-09 DIAGNOSIS — I517 Cardiomegaly: Secondary | ICD-10-CM | POA: Diagnosis not present

## 2023-03-09 DIAGNOSIS — I110 Hypertensive heart disease with heart failure: Secondary | ICD-10-CM | POA: Diagnosis present

## 2023-03-09 DIAGNOSIS — R911 Solitary pulmonary nodule: Secondary | ICD-10-CM | POA: Diagnosis present

## 2023-03-09 DIAGNOSIS — I350 Nonrheumatic aortic (valve) stenosis: Secondary | ICD-10-CM | POA: Diagnosis not present

## 2023-03-09 DIAGNOSIS — R6889 Other general symptoms and signs: Secondary | ICD-10-CM | POA: Diagnosis not present

## 2023-03-09 DIAGNOSIS — Z923 Personal history of irradiation: Secondary | ICD-10-CM

## 2023-03-09 DIAGNOSIS — Z8673 Personal history of transient ischemic attack (TIA), and cerebral infarction without residual deficits: Secondary | ICD-10-CM

## 2023-03-09 DIAGNOSIS — C541 Malignant neoplasm of endometrium: Secondary | ICD-10-CM | POA: Diagnosis present

## 2023-03-09 DIAGNOSIS — I2510 Atherosclerotic heart disease of native coronary artery without angina pectoris: Secondary | ICD-10-CM | POA: Diagnosis not present

## 2023-03-09 DIAGNOSIS — I214 Non-ST elevation (NSTEMI) myocardial infarction: Secondary | ICD-10-CM | POA: Diagnosis present

## 2023-03-09 DIAGNOSIS — I2121 ST elevation (STEMI) myocardial infarction involving left circumflex coronary artery: Secondary | ICD-10-CM | POA: Diagnosis not present

## 2023-03-09 DIAGNOSIS — J9611 Chronic respiratory failure with hypoxia: Secondary | ICD-10-CM | POA: Diagnosis present

## 2023-03-09 DIAGNOSIS — R042 Hemoptysis: Secondary | ICD-10-CM | POA: Diagnosis not present

## 2023-03-09 DIAGNOSIS — T81718A Complication of other artery following a procedure, not elsewhere classified, initial encounter: Secondary | ICD-10-CM | POA: Insufficient documentation

## 2023-03-09 DIAGNOSIS — J984 Other disorders of lung: Secondary | ICD-10-CM | POA: Diagnosis not present

## 2023-03-09 DIAGNOSIS — Z86718 Personal history of other venous thrombosis and embolism: Secondary | ICD-10-CM

## 2023-03-09 DIAGNOSIS — I10 Essential (primary) hypertension: Secondary | ICD-10-CM | POA: Diagnosis present

## 2023-03-09 DIAGNOSIS — R918 Other nonspecific abnormal finding of lung field: Secondary | ICD-10-CM | POA: Diagnosis not present

## 2023-03-09 DIAGNOSIS — Z9049 Acquired absence of other specified parts of digestive tract: Secondary | ICD-10-CM

## 2023-03-09 DIAGNOSIS — R079 Chest pain, unspecified: Secondary | ICD-10-CM | POA: Diagnosis not present

## 2023-03-09 DIAGNOSIS — R0789 Other chest pain: Secondary | ICD-10-CM | POA: Diagnosis not present

## 2023-03-09 DIAGNOSIS — Z90710 Acquired absence of both cervix and uterus: Secondary | ICD-10-CM

## 2023-03-09 DIAGNOSIS — R600 Localized edema: Secondary | ICD-10-CM | POA: Diagnosis not present

## 2023-03-09 DIAGNOSIS — Z7902 Long term (current) use of antithrombotics/antiplatelets: Secondary | ICD-10-CM

## 2023-03-09 DIAGNOSIS — I213 ST elevation (STEMI) myocardial infarction of unspecified site: Secondary | ICD-10-CM | POA: Diagnosis not present

## 2023-03-09 DIAGNOSIS — Z9221 Personal history of antineoplastic chemotherapy: Secondary | ICD-10-CM

## 2023-03-09 DIAGNOSIS — I878 Other specified disorders of veins: Secondary | ICD-10-CM | POA: Diagnosis present

## 2023-03-09 HISTORY — PX: CORONARY/GRAFT ACUTE MI REVASCULARIZATION: CATH118305

## 2023-03-09 LAB — I-STAT CG4 LACTIC ACID, ED: Lactic Acid, Venous: 1 mmol/L (ref 0.5–1.9)

## 2023-03-09 LAB — ECHOCARDIOGRAM LIMITED
AR max vel: 0.63 cm2
AV Area VTI: 0.65 cm2
AV Area mean vel: 0.6 cm2
AV Mean grad: 20 mm[Hg]
AV Peak grad: 33.9 mm[Hg]
Ao pk vel: 2.91 m/s
Area-P 1/2: 3.34 cm2
Height: 61 in
MV M vel: 5.86 m/s
MV Peak grad: 137.4 mm[Hg]
Radius: 0.4 cm
S' Lateral: 2.9 cm
Weight: 2384 [oz_av]

## 2023-03-09 LAB — CBC WITH DIFFERENTIAL/PLATELET
Abs Immature Granulocytes: 0.03 10*3/uL (ref 0.00–0.07)
Basophils Absolute: 0 10*3/uL (ref 0.0–0.1)
Basophils Relative: 1 %
Eosinophils Absolute: 0.1 10*3/uL (ref 0.0–0.5)
Eosinophils Relative: 1 %
HCT: 34.2 % — ABNORMAL LOW (ref 36.0–46.0)
Hemoglobin: 10.6 g/dL — ABNORMAL LOW (ref 12.0–15.0)
Immature Granulocytes: 1 %
Lymphocytes Relative: 12 %
Lymphs Abs: 0.7 10*3/uL (ref 0.7–4.0)
MCH: 27.2 pg (ref 26.0–34.0)
MCHC: 31 g/dL (ref 30.0–36.0)
MCV: 87.7 fL (ref 80.0–100.0)
Monocytes Absolute: 0.2 10*3/uL (ref 0.1–1.0)
Monocytes Relative: 4 %
Neutro Abs: 5.1 10*3/uL (ref 1.7–7.7)
Neutrophils Relative %: 81 %
Platelets: 340 10*3/uL (ref 150–400)
RBC: 3.9 MIL/uL (ref 3.87–5.11)
RDW: 16.6 % — ABNORMAL HIGH (ref 11.5–15.5)
WBC: 6.2 10*3/uL (ref 4.0–10.5)
nRBC: 0 % (ref 0.0–0.2)

## 2023-03-09 LAB — COMPREHENSIVE METABOLIC PANEL
ALT: 8 U/L (ref 0–44)
AST: 18 U/L (ref 15–41)
Albumin: 3.5 g/dL (ref 3.5–5.0)
Alkaline Phosphatase: 74 U/L (ref 38–126)
Anion gap: 10 (ref 5–15)
BUN: 16 mg/dL (ref 8–23)
CO2: 23 mmol/L (ref 22–32)
Calcium: 9.2 mg/dL (ref 8.9–10.3)
Chloride: 103 mmol/L (ref 98–111)
Creatinine, Ser: 0.89 mg/dL (ref 0.44–1.00)
GFR, Estimated: >60 mL/min (ref >60)
Glucose, Bld: 138 mg/dL — ABNORMAL HIGH (ref 70–99)
Potassium: 4.1 mmol/L (ref 3.5–5.1)
Sodium: 136 mmol/L (ref 135–145)
Total Bilirubin: 0.5 mg/dL (ref 0.3–1.2)
Total Protein: 6.5 g/dL (ref 6.5–8.1)

## 2023-03-09 LAB — PROTIME-INR
INR: 1.5 — ABNORMAL HIGH (ref 0.8–1.2)
Prothrombin Time: 18.4 s — ABNORMAL HIGH (ref 11.4–15.2)

## 2023-03-09 LAB — POCT I-STAT, CHEM 8
BUN: 16 mg/dL (ref 8–23)
Calcium, Ion: 1.26 mmol/L (ref 1.15–1.40)
Chloride: 101 mmol/L (ref 98–111)
Creatinine, Ser: 0.9 mg/dL (ref 0.44–1.00)
Glucose, Bld: 166 mg/dL — ABNORMAL HIGH (ref 70–99)
HCT: 33 % — ABNORMAL LOW (ref 36.0–46.0)
Hemoglobin: 11.2 g/dL — ABNORMAL LOW (ref 12.0–15.0)
Potassium: 4 mmol/L (ref 3.5–5.1)
Sodium: 137 mmol/L (ref 135–145)
TCO2: 22 mmol/L (ref 22–32)

## 2023-03-09 LAB — LIPID PANEL
Cholesterol: 173 mg/dL (ref 0–200)
HDL: 48 mg/dL (ref >40)
LDL Cholesterol: 111 mg/dL — ABNORMAL HIGH (ref 0–99)
Total CHOL/HDL Ratio: 3.6 {ratio}
Triglycerides: 70 mg/dL (ref <150)
VLDL: 14 mg/dL (ref 0–40)

## 2023-03-09 LAB — TROPONIN I (HIGH SENSITIVITY)
Troponin I (High Sensitivity): 238 ng/L (ref <18)
Troponin I (High Sensitivity): 2626 ng/L (ref <18)

## 2023-03-09 LAB — POCT ACTIVATED CLOTTING TIME
Activated Clotting Time: 257 s
Activated Clotting Time: 281 s

## 2023-03-09 LAB — HEMOGLOBIN A1C
Hgb A1c MFr Bld: 5.3 % (ref 4.8–5.6)
Mean Plasma Glucose: 105.41 mg/dL

## 2023-03-09 LAB — APTT: aPTT: 41 s — ABNORMAL HIGH (ref 24–36)

## 2023-03-09 LAB — CG4 I-STAT (LACTIC ACID): Lactic Acid, Venous: 0.8 mmol/L (ref 0.5–1.9)

## 2023-03-09 LAB — GLUCOSE, CAPILLARY: Glucose-Capillary: 131 mg/dL — ABNORMAL HIGH (ref 70–99)

## 2023-03-09 SURGERY — CORONARY/GRAFT ACUTE MI REVASCULARIZATION
Anesthesia: LOCAL

## 2023-03-09 MED ORDER — MIDAZOLAM HCL 2 MG/2ML IJ SOLN
INTRAMUSCULAR | Status: AC
  Filled 2023-03-09: qty 2

## 2023-03-09 MED ORDER — SODIUM CHLORIDE 0.9 % IV SOLN: 250.0000 mL | INTRAVENOUS | Status: DC | PRN

## 2023-03-09 MED ORDER — TRAMADOL HCL 50 MG PO TABS
100.0000 mg | ORAL_TABLET | Freq: Two times a day (BID) | ORAL | Status: DC
  Administered 2023-03-09 – 2023-03-15 (×13): 100 mg via ORAL
  Filled 2023-03-09 (×13): qty 2

## 2023-03-09 MED ORDER — HEPARIN SODIUM (PORCINE) 1000 UNIT/ML IJ SOLN
INTRAMUSCULAR | Status: AC
  Filled 2023-03-09: qty 10

## 2023-03-09 MED ORDER — VERAPAMIL HCL 2.5 MG/ML IV SOLN
INTRAVENOUS | Status: AC
  Filled 2023-03-09: qty 2

## 2023-03-09 MED ORDER — SODIUM CHLORIDE 0.9 % IV SOLN: INTRAVENOUS | Status: DC

## 2023-03-09 MED ORDER — ONDANSETRON HCL 4 MG/2ML IJ SOLN
INTRAMUSCULAR | Status: AC
  Filled 2023-03-09: qty 2

## 2023-03-09 MED ORDER — CARVEDILOL 6.25 MG PO TABS
6.2500 mg | ORAL_TABLET | Freq: Two times a day (BID) | ORAL | Status: DC
  Administered 2023-03-09 (×2): 6.25 mg via ORAL
  Filled 2023-03-09 (×4): qty 1

## 2023-03-09 MED ORDER — LEVOTHYROXINE SODIUM 50 MCG PO TABS
50.0000 ug | ORAL_TABLET | Freq: Every day | ORAL | Status: DC
  Administered 2023-03-09 – 2023-03-15 (×7): 50 ug via ORAL
  Filled 2023-03-09 (×7): qty 1

## 2023-03-09 MED ORDER — DILTIAZEM HCL ER COATED BEADS 300 MG PO CP24
300.0000 mg | ORAL_CAPSULE | Freq: Every day | ORAL | Status: DC
  Administered 2023-03-09: 300 mg via ORAL
  Filled 2023-03-09 (×2): qty 1

## 2023-03-09 MED ORDER — CLOPIDOGREL BISULFATE 75 MG PO TABS
75.0000 mg | ORAL_TABLET | Freq: Every day | ORAL | Status: DC
  Administered 2023-03-10 – 2023-03-15 (×6): 75 mg via ORAL
  Filled 2023-03-09 (×6): qty 1

## 2023-03-09 MED ORDER — ONDANSETRON HCL 4 MG/2ML IJ SOLN: 4.0000 mg | Freq: Four times a day (QID) | INTRAMUSCULAR | Status: DC | PRN

## 2023-03-09 MED ORDER — EZETIMIBE 10 MG PO TABS
10.0000 mg | ORAL_TABLET | Freq: Every day | ORAL | Status: DC
  Administered 2023-03-09 – 2023-03-15 (×7): 10 mg via ORAL
  Filled 2023-03-09 (×7): qty 1

## 2023-03-09 MED ORDER — POLYETHYLENE GLYCOL 3350 17 G PO PACK
17.0000 g | PACK | Freq: Every day | ORAL | Status: DC
  Administered 2023-03-09 – 2023-03-15 (×7): 17 g via ORAL
  Filled 2023-03-09 (×7): qty 1

## 2023-03-09 MED ORDER — LIDOCAINE HCL (PF) 1 % IJ SOLN
INTRAMUSCULAR | Status: DC | PRN
  Administered 2023-03-09: 5 mL

## 2023-03-09 MED ORDER — ORAL CARE MOUTH RINSE
15.0000 mL | OROMUCOSAL | Status: DC
  Administered 2023-03-09 – 2023-03-15 (×10): 15 mL via OROMUCOSAL

## 2023-03-09 MED ORDER — HYDRALAZINE HCL 20 MG/ML IJ SOLN: 10.0000 mg | INTRAMUSCULAR | Status: AC | PRN

## 2023-03-09 MED ORDER — IOHEXOL 350 MG/ML SOLN
INTRAVENOUS | Status: DC | PRN
  Administered 2023-03-09: 60 mL

## 2023-03-09 MED ORDER — CLOPIDOGREL BISULFATE 300 MG PO TABS
600.0000 mg | ORAL_TABLET | Freq: Once | ORAL | Status: AC
  Administered 2023-03-09: 600 mg via ORAL
  Filled 2023-03-09: qty 2

## 2023-03-09 MED ORDER — NITROGLYCERIN 0.4 MG SL SUBL: 0.4000 mg | SUBLINGUAL_TABLET | SUBLINGUAL | Status: DC | PRN

## 2023-03-09 MED ORDER — LABETALOL HCL 5 MG/ML IV SOLN: 10.0000 mg | INTRAVENOUS | Status: AC | PRN

## 2023-03-09 MED ORDER — SODIUM CHLORIDE 0.9% FLUSH
3.0000 mL | Freq: Two times a day (BID) | INTRAVENOUS | Status: DC
  Administered 2023-03-09 – 2023-03-14 (×8): 3 mL via INTRAVENOUS

## 2023-03-09 MED ORDER — APIXABAN 5 MG PO TABS
5.0000 mg | ORAL_TABLET | Freq: Two times a day (BID) | ORAL | Status: DC
  Administered 2023-03-10: 5 mg via ORAL
  Filled 2023-03-09: qty 1

## 2023-03-09 MED ORDER — SODIUM CHLORIDE 0.9% FLUSH: 3.0000 mL | INTRAVENOUS | Status: DC | PRN

## 2023-03-09 MED ORDER — ONDANSETRON HCL 4 MG/2ML IJ SOLN
4.0000 mg | Freq: Once | INTRAMUSCULAR | Status: AC
  Administered 2023-03-09: 4 mg via INTRAVENOUS

## 2023-03-09 MED ORDER — ACETAMINOPHEN 325 MG PO TABS: 650.0000 mg | ORAL_TABLET | ORAL | Status: DC | PRN

## 2023-03-09 MED ORDER — CHLORHEXIDINE GLUCONATE CLOTH 2 % EX PADS
6.0000 | MEDICATED_PAD | Freq: Every day | CUTANEOUS | Status: DC
  Administered 2023-03-10 – 2023-03-15 (×6): 6 via TOPICAL

## 2023-03-09 MED ORDER — SODIUM CHLORIDE 0.9% FLUSH
3.0000 mL | Freq: Two times a day (BID) | INTRAVENOUS | Status: DC
  Administered 2023-03-09 – 2023-03-14 (×9): 3 mL via INTRAVENOUS

## 2023-03-09 MED ORDER — ORAL CARE MOUTH RINSE: 15.0000 mL | OROMUCOSAL | Status: DC | PRN

## 2023-03-09 MED ORDER — MIDAZOLAM HCL 2 MG/2ML IJ SOLN
INTRAMUSCULAR | Status: DC | PRN
  Administered 2023-03-09: 1 mg via INTRAVENOUS

## 2023-03-09 MED ORDER — LIDOCAINE HCL (PF) 1 % IJ SOLN
INTRAMUSCULAR | Status: AC
  Filled 2023-03-09: qty 30

## 2023-03-09 MED ORDER — HEPARIN SODIUM (PORCINE) 1000 UNIT/ML IJ SOLN
INTRAMUSCULAR | Status: DC | PRN
  Administered 2023-03-09: 2000 [IU] via INTRAVENOUS
  Administered 2023-03-09: 5000 [IU] via INTRAVENOUS

## 2023-03-09 MED ORDER — ASPIRIN 81 MG PO CHEW
81.0000 mg | CHEWABLE_TABLET | Freq: Every day | ORAL | Status: DC
  Administered 2023-03-09 – 2023-03-15 (×7): 81 mg via ORAL
  Filled 2023-03-09 (×7): qty 1

## 2023-03-09 MED ORDER — NITROGLYCERIN 1 MG/10 ML FOR IR/CATH LAB
INTRA_ARTERIAL | Status: AC
  Filled 2023-03-09: qty 10

## 2023-03-09 MED ORDER — HEPARIN (PORCINE) IN NACL 1000-0.9 UT/500ML-% IV SOLN
INTRAVENOUS | Status: DC | PRN
  Administered 2023-03-09 (×2): 500 mL

## 2023-03-09 MED ORDER — MORPHINE SULFATE (PF) 2 MG/ML IV SOLN: 2.0000 mg | INTRAVENOUS | Status: DC | PRN

## 2023-03-09 SURGICAL SUPPLY — 22 items
BALLN EMERGE MR 2.5X12 (BALLOONS) ×1
BALLOON EMERGE MR 2.5X12 (BALLOONS) IMPLANT
CATH DIAG EXPO 6F AL1 (CATHETERS) IMPLANT
CATH INFINITI 5FR MULTPACK ANG (CATHETERS) IMPLANT
CATH LAUNCHER 6FR AL.75 (CATHETERS) IMPLANT
CATHETER LAUNCHER 6FR LCB (CATHETERS) IMPLANT
CLOSURE PERCLOSE PROSTYLE (VASCULAR PRODUCTS) IMPLANT
GLIDESHEATH SLEND SS 6F .021 (SHEATH) IMPLANT
GUIDEWIRE INQWIRE 1.5J.035X260 (WIRE) IMPLANT
INQWIRE 1.5J .035X260CM (WIRE) ×1
KIT ENCORE 26 ADVANTAGE (KITS) IMPLANT
KIT HEMO VALVE WATCHDOG (MISCELLANEOUS) IMPLANT
KIT SYRINGE INJ CVI SPIKEX1 (MISCELLANEOUS) IMPLANT
PACK CARDIAC CATHETERIZATION (CUSTOM PROCEDURE TRAY) ×1 IMPLANT
SET ATX-X65L (MISCELLANEOUS) IMPLANT
SHEATH PINNACLE 6F 10CM (SHEATH) IMPLANT
SHEATH PROBE COVER 6X72 (BAG) IMPLANT
STENT SYNERGY XD 3.0X12 (Permanent Stent) IMPLANT
SYNERGY XD 3.0X12 (Permanent Stent) ×1 IMPLANT
WIRE EMERALD 3MM-J .035X150CM (WIRE) IMPLANT
WIRE MICRO SET SILHO 5FR 7 (SHEATH) IMPLANT
WIRE RUNTHROUGH .014X180CM (WIRE) IMPLANT

## 2023-03-09 NOTE — ED Triage Notes (Signed)
Pt arrived via EMS from home. Pt c/o chest pain that began at 2230 last night, radiating to jaw & back. Pt took 5 sublingual ntg pta. Pt was given another sublingual ntg by ems for a total of 6, also given 6mg  morphine by ems. Pt has history of A. Fib, MI, CABG.

## 2023-03-09 NOTE — ED Notes (Signed)
Cardiology at bedside.

## 2023-03-09 NOTE — ED Notes (Signed)
STEMI activated per Dr. Clayborne Dana, [facility] called

## 2023-03-09 NOTE — Telephone Encounter (Signed)
Patient's daughter Marylene Land called the office to cancel patient's appointment that is scheduled for today at 1:30 with Dr. Tamela Oddi. Marylene Land states that her mother is in the hospital and will call back to reschedule pt's appointment at another time. [place] for calling and let her know we are thinking about Shannon Scott.

## 2023-03-09 NOTE — Progress Notes (Signed)
Chaplain responded to [employer],meeting pt's family in [facility] waiting area.  Chaplain engaged in ministry of presence with pt's family as they spoke of the event leading up to pt being here.  Chaplain and family prayed together.  Vernell Morgans Chaplain

## 2023-03-09 NOTE — Progress Notes (Signed)
  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   Pt presented last night with recurrent STEMI and underwent PCI of the SVG-OM with Dr. Excell Seltzer. We had discussed her case on 03/08/23 in our valve team meeting. Given high risk of recurrent MI as well as her other comorbidities, she will not be a candidate for TAVR. Dr. Excell Seltzer discussed this with the patient and her family. I will let the primary team know.   Of note, pre TAVR scans did show a "new small solid pulmonary nodule of the left lower lobe measuring 3 mm. Given history of primary malignancy, consider follow-up chest CT in 6 months." This will need to be follow up on. I will forward this to her gyn-onc MD, Dr. Pricilla Holm as an Lorain Childes.   Cline Crock PA-C  [facility]

## 2023-03-09 NOTE — Progress Notes (Signed)
  Echocardiogram 2D Echocardiogram has been performed.  Delcie Roch 03/09/2023, 11:00 AM

## 2023-03-09 NOTE — ED Provider Notes (Signed)
[facility] EMERGENCY DEPARTMENT AT [facility] Provider Note   CSN: [id number] Arrival date & time: 03/09/23  0406     History  Chief Complaint  Patient presents with   Chest Pain    Shannon Scott is a 82 y.o. female.  82 yo F here with multiple medical problems, recently cathed with multivessel disease but also aneurysms in the setting of an NSTEMI. Elected to medically manage and decide on follow up for any intervention. Apparently also considering TAVR.  Also has h/o endometrial cancer status post chemoradiation, immunotherapy, hysterectomy BSO, colonic mass with partial colectomy and colostomy, prior DVT, A-fib on anticoagulation, hypertension, hyperlipidemia, statin intolerance, lymphedema, asthma, hypothyroidism. Last dose of eliquis was last evening.   Had been asymptomatic since leaving the hospital 5 days ago. Last night at 2230 or so had acute onset of cp, sob, nausea. Took 4 81mg  asa. Didn't improve. Had 6 SL NTG (unclear whether this was her/EMS or both) but that also ddidn't help. Morphine with EMS didn't help. Still with central/left sided chest pain and nausea with generalized weakness. Lymphedema present but currently at baseline.    Chest Pain      Home Medications Prior to Admission medications   Medication Sig Start Date End Date Taking? Authorizing Provider  apixaban (ELIQUIS) 5 MG TABS tablet Take 1 tablet (5 mg total) by mouth 2 (two) times daily. 03/29/20   Miguel Rota, MD  aspirin 81 MG chewable tablet Chew 1 tablet (81 mg total) by mouth daily. 03/05/23   Glade Lloyd, MD  carvedilol (COREG) 6.25 MG tablet Take 1 tablet (6.25 mg total) by mouth 2 (two) times daily with a meal. 03/04/23   Glade Lloyd, MD  clobetasol ointment (TEMOVATE) 0.05 % Apply 1 Application topically 2 (two) times a week. 06/24/22   Antionette Char, MD  diltiazem (TIAZAC) 300 MG 24 hr capsule Take 1 capsule (300 mg total) by mouth at bedtime. 03/04/23   Glade Lloyd, MD  ezetimibe (ZETIA) 10 MG tablet Take 10 mg by mouth daily. 06/05/21   [provider]  levothyroxine (SYNTHROID, LEVOTHROID) 50 MCG tablet Take 1 tablet (50 mcg total) by mouth daily before breakfast. 10/25/17   Tanner, Kathrin Greathouse., PA-C  mometasone (ELOCON) 0.1 % cream Apply 1 Application topically daily as needed (to the vulva for irritation and itching). 05/18/22   Cross, Efraim Kaufmann D, NP  nitroGLYCERIN (NITROSTAT) 0.4 MG SL tablet Place 1 tablet (0.4 mg total) under the tongue every 5 (five) minutes as needed for chest pain. 03/04/23   Glade Lloyd, MD  polyethylene glycol (MIRALAX / GLYCOLAX) 17 g packet Take 17 g by mouth daily as needed for severe constipation. Patient taking differently: Take 17 g by mouth daily. 03/29/20   Miguel Rota, MD  REPATHA SURECLICK 140 MG/ML SOAJ INJECT 1 DOSE UNDER THE SKIN EVERY 14 DAYS 06/21/22   Hilty, Lisette Abu, MD  traMADol (ULTRAM) 50 MG tablet TAKE 2 TABLETS(100 MG) BY MOUTH TWICE DAILY 02/04/23   Josph Macho, MD      Allergies    Statins and Ciprofloxacin    Review of Systems   Review of Systems  Cardiovascular:  Positive for chest pain.    Physical Exam Updated Vital Signs BP (!) 126/50 (BP Location: Right Arm)   Pulse 68   Temp 97.8 F (36.6 C) (Oral)   Resp 18   Ht 5\' 1"  (1.549 m)   Wt 67.6 kg   SpO2 99%  BMI 28.15 kg/m  Physical Exam Vitals and nursing note reviewed.  Constitutional:      Appearance: She is well-developed. She is ill-appearing. She is not diaphoretic.     Comments: Clutching central chest  HENT:     Head: Normocephalic and atraumatic.  Cardiovascular:     Rate and Rhythm: Normal rate and regular rhythm.  Pulmonary:     Effort: No respiratory distress.     Breath sounds: No stridor.  Abdominal:     General: There is no distension.  Musculoskeletal:     Cervical back: Normal range of motion.  Skin:    Coloration: Skin is pale.  Neurological:     Mental Status: She is alert.     ED  Results / Procedures / Treatments   Labs (all labs ordered are listed, but only abnormal results are displayed) Labs Reviewed  HEMOGLOBIN A1C  CBC WITH DIFFERENTIAL/PLATELET  PROTIME-INR  APTT  COMPREHENSIVE METABOLIC PANEL  LIPID PANEL  I-STAT CG4 LACTIC ACID, ED  TROPONIN I (HIGH SENSITIVITY)    EKG None  EKG Interpretation Date/Time:    Ventricular Rate:    PR Interval:    QRS Duration:    QT Interval:    QTC Calculation:   R Axis:      Text Interpretation:           Radiology DG Chest Port 1 View  Result Date: 03/09/2023 CLINICAL DATA:  Chest pain. EXAM: PORTABLE CHEST 1 VIEW COMPARISON:  03/01/2023. FINDINGS: The heart is enlarged and the mediastinal contour is within normal limits. There is atherosclerotic calcification of the aorta. Interstitial prominence is noted bilaterally with mild airspace disease at the lung bases. No effusion or pneumothorax. A right chest port is stable in position. No acute osseous abnormality. IMPRESSION: Interstitial prominence bilaterally with increased airspace disease at the lung bases, possible edema or infiltrate. Electronically Signed   By: Thornell Sartorius M.D.   On: 03/09/2023 04:54    Procedures .Critical Care  Performed by: Marily Memos, MD Authorized by: Marily Memos, MD   Critical care provider statement:    Critical care time (minutes):  30   Critical care was necessary to treat or prevent imminent or life-threatening deterioration of the following conditions:  Cardiac failure   Critical care was time spent personally by me on the following activities:  Development of treatment plan with patient or surrogate, discussions with consultants, evaluation of patient's response to treatment, examination of patient, ordering and review of laboratory studies, ordering and review of radiographic studies, ordering and performing treatments and interventions, pulse oximetry, re-evaluation of patient's condition and review of old  charts     Medications Ordered in ED Medications  0.9 %  sodium chloride infusion (has no administration in time range)  ondansetron (ZOFRAN) 4 MG/2ML injection (has no administration in time range)  clopidogrel (PLAVIX) tablet 600 mg (600 mg Oral Given 03/09/23 0504)  ondansetron (ZOFRAN) injection 4 mg (4 mg Intravenous Given 03/09/23 0505)    ED Course/ Medical Decision Making/ A&P                                 Medical Decision Making Amount and/or Complexity of Data Reviewed Labs: ordered. Radiology: ordered.  Risk Prescription drug management. Decision regarding hospitalization.   ECG done at 0427. Given to me at around 0431. Obvious ST changes inferiorly, laterally concerning for RCA. As she had been here previously with  NSTEMI and there were q waves, I paged cardiology and subsequently looked at her old one. These changes were new and on cath 10/1 she had multi vessel disease but other irregularities and they were suggesting medical management. Pt had had 6 NTG and morphine and was still with severe chest pain so STEMI activated at or around 0437. O2 applied for hypoxia. BP soft, held NTG as it didn't work anyway, will give fentanyl. Had eliquis last night, d/w pharmacy who deferred to cardiology. Cardiology fellow arrived, d/w interventional cards and will hold heparin for now, give it in the lab but will take to cath.   Final Clinical Impression(s) / ED Diagnoses Final diagnoses:  ST elevation myocardial infarction involving right coronary artery [facility])    Rx / DC Orders ED Discharge Orders     None         Mckinzy Fuller, Barbara Cower, MD 03/09/23 [phone]

## 2023-03-09 NOTE — Progress Notes (Signed)
   Brief Note:  Briefly saw evaluate patient this morning.  No longer having chest pain or dyspnea.  Was resting relatively comfortably with exception of having discomfort from bilateral antecubital IVs.  Her main concern was revolving the IVs.  No chest pain pressure or dyspnea.  Cath films reviewed.  PCI of SVG to OM very focally with restoration of TIMI-3 flow.  Otherwise has pretty severe disease with plans to just treat medically going forward.  She will be on DAPT plus at [facility] for 2 weeks and then stop aspirin.  May want to consider reducing dose of DOAC to the 2.5 mg twice daily from 5 mg Eliquis based on her borderline weight and risks especially in light of being on Plavix as well.  Will continue home medications for BP. Monitor closely for signs of volume overload given aortic stenosis and recent MI.  Felt to be a poor TAVR candidate for your stenosis, therefore treatment going forward will be simply palliative in nature.   Hemodynamically stable. Potentially ambulate in hallway today. With multiple comorbidities, she she is not a great candidate for early discharge.   Bryan Lemma, MD

## 2023-03-09 NOTE — ED Notes (Signed)
Verbal orders recved for 600mg  PO Plavix and 4mg  IV zofran from MD Mayl.

## 2023-03-09 NOTE — ED Notes (Signed)
Pt states she took 4 chewable aspirin PTA.

## 2023-03-09 NOTE — Progress Notes (Signed)
Carotid arterial duplex completed. Please see CV Procedures for preliminary results.  Shona Simpson, RVT 03/09/23 2:12 PM

## 2023-03-09 NOTE — ED Notes (Signed)
Pt transported to cath lab on ZOLL monitoring. Bedside report given in cath lab.

## 2023-03-09 NOTE — H&P (Addendum)
Cardiology Admission History and Physical   Patient ID: Shannon Scott MRN: [id number]; DOB: 1940/12/23   Admission date: 03/09/2023  PCP:  Shannon Ishihara, Scott   Shannon Scott Cardiologist:  Shannon Nose, Scott        Chief Complaint:  Chest Pain  Patient Profile:   Shannon Scott is a 82 y.o. female with hx of 3V CABG LIMA-LAD, SVG-OM, SVG-RCA, severe aortic stenosis, locally advanced endometrial cancer status post chemoradiation, immunotherapy, hysterectomy BSO, colonic mass with partial colectomy and colostomy, prior DVT, A-fib on apixaban, hypertension, hyperlipidemia, statin intolerance, lymphedema, asthma, hypothyroidism  who is being seen 03/09/2023 for the evaluation of inferior STEMI.  History of Present Illness:   Shannon Scott presents from home with substernal chest pain 5-6 in intensity now. ECG shows inferior STEMI with STE in lead 3 more than lead 2 and STD in lateral leads with subtle STE in V6. She did just have a LHC for NSTEMI with Dr. Jacinto Scott and because of comorbidities, graft anatomy, medical management was recommended.  Unfortunately she presents with sudden onset substernal chest pain since 6 PM that is severe and unrelenting.  She was clutching her chest.  She was hemodynamically stable.  STEMI team was activated by the emergency department and case reviewed with Shannon Scott who elected to take her emergently to the Cath Lab for LHC.  Prior to transport she was loaded with Plavix 600 mg.  It was communicated that she did have her last dose of Eliquis last night at 6 PM. Of note she is being worked up for TAVR and was recently discussed at heart team.   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 (HCC) 10/11/2017   Postoperative nausea and vomiting 01/02/2016    Past Surgical History:  Procedure Laterality Date   ABDOMINAL HYSTERECTOMY     Carotid Doppler  02/2012   40-59% right int carotid artery stenosis; 60-79% L int carotid stenosis; L carotid bruit   CORONARY ARTERY BYPASS GRAFT  03/12/2004   LIMA to LAD, SVG to circumflex, SVG to PDA   CORONARY/GRAFT ANGIOGRAPHY N/A 03/02/2023   Procedure: CORONARY/GRAFT ANGIOGRAPHY;  Surgeon: Shannon Decamp, Scott;  Location: [facility] INVASIVE CV LAB;  Service: Cardiovascular;  Laterality: N/A;   CYSTOSCOPY W/ URETERAL STENT PLACEMENT Left 12/06/2017   Procedure: CYSTOSCOPY WITH LEFT URETERAL STENT EXCHANGE;  Surgeon: Shannon Pippin, Scott;  Location: [facility] ORS;  Service: Urology;  Laterality: Left;   CYSTOSCOPY WITH STENT PLACEMENT Bilateral 08/21/2017   Procedure: CYSTOSCOPY WITH STENT PLACEMENT;  Surgeon: Shannon Paci, Scott;  Location: [facility];  Service: Urology;  Laterality: Bilateral;   IR FLUORO GUIDE PORT INSERTION RIGHT  10/11/2017   IR GENERIC Scott  04/29/2016   IR RADIOLOGIST EVAL & MGMT 04/29/2016 Shannon Come, Scott [facility] INTERV RAD   IR  GENERIC Scott  05/12/2016   IR RADIOLOGIST EVAL & MGMT 05/12/2016 Shannon Come, Scott [facility] INTERV RAD   IR IVC FILTER PLMT / S&I Shannon Scott GUID/MOD SED  10/11/2017   IR US GUIDE VASC ACCESS RIGHT  10/11/2017   ROBOTIC ASSISTED TOTAL HYSTERECTOMY WITH BILATERAL SALPINGO OOPHERECTOMY Bilateral 12/30/2015   Procedure: XI ROBOTIC ASSISTED TOTAL HYSTERECTOMY WITH BILATERAL SALPINGO OOPHORECTOMY WITH SENTAL LYMPH NODE BIOPSY;  Surgeon: Shannon Mccreedy, Scott;  Location: [facility];  Service:  Gynecology;  Laterality: Bilateral;   TONSILLECTOMY     TRANSTHORACIC ECHOCARDIOGRAM  04/2007   EF>55%; mild MR; mild-mod TR; mild pulm HTN; mild calcification of aortiv valve leaflets with mild valvular aortic stenosis   TUBAL LIGATION       Medications Prior to Admission: Prior to Admission medications   Medication Sig Start Date End Date Taking? Authorizing Provider  apixaban (ELIQUIS) 5 MG TABS tablet Take 1 tablet (5 mg total) by mouth 2 (two) times daily. 03/29/20   Shannon Rota, Scott  aspirin 81 MG chewable tablet Chew 1 tablet (81 mg total) by mouth daily. 03/05/23   Shannon Lloyd, Scott  carvedilol (COREG) 6.25 MG tablet Take 1 tablet (6.25 mg total) by mouth 2 (two) times daily with a meal. 03/04/23   Shannon Lloyd, Scott  clobetasol ointment (TEMOVATE) 0.05 % Apply 1 Application topically 2 (two) times a week. 06/24/22   Shannon Char, Scott  diltiazem (TIAZAC) 300 MG 24 hr capsule Take 1 capsule (300 mg total) by mouth at bedtime. 03/04/23   Shannon Lloyd, Scott  ezetimibe (ZETIA) 10 MG tablet Take 10 mg by mouth daily. 06/05/21   Provider, Historical, Scott  levothyroxine (SYNTHROID, LEVOTHROID) 50 MCG tablet Take 1 tablet (50 mcg total) by mouth daily before breakfast. 10/25/17   Shannon Scott  mometasone (ELOCON) 0.1 % cream Apply 1 Application topically daily as needed (to the vulva for irritation and itching). 05/18/22   Cross, Shannon Scott  nitroGLYCERIN (NITROSTAT) 0.4 MG SL tablet Place 1 tablet (0.4 mg total) under the tongue every 5 (five) minutes as needed for chest pain. 03/04/23   Shannon Lloyd, Scott  polyethylene glycol (MIRALAX / GLYCOLAX) 17 g packet Take 17 g by mouth daily as needed for severe constipation. Patient taking differently: Take 17 g by mouth daily. 03/29/20   Shannon Rota, Scott  REPATHA SURECLICK 140 MG/ML SOAJ INJECT 1 DOSE UNDER THE SKIN EVERY 14 DAYS 06/21/22   Shannon Scott  traMADol (ULTRAM) 50 MG tablet TAKE 2 TABLETS(100 MG) BY MOUTH TWICE DAILY  02/04/23   Shannon Macho, Scott     Allergies:    Allergies  Allergen Reactions   Statins Other (See Comments)    Myalgias and memory problems   Ciprofloxacin Itching and Other (See Comments)    Splotchy redness with itching during IV infusion localized to arm. Other reaction(s): itching/swelling/redness at injection site    Social History:   Social History   Socioeconomic History   Marital status: Widowed    Spouse name: Not on file   Number of children: 2   Years of education: Not on file   Highest education level: Not on file  Occupational History    Employer: RETIRED  Tobacco Use   Smoking status: Never   Smokeless tobacco: Never  Vaping Use   Vaping status: Never Used  Substance and Sexual Activity   Alcohol use: No   Drug use: No   Sexual activity: Not Currently  Other  Topics Concern   Not on file  Social History Narrative   Not on file   Social Determinants of Health   Financial Resource Strain: Not on file  Food Insecurity: No Food Insecurity (03/01/2023)   Hunger Vital Sign    Worried About Running Out of Food in the Last Year: Never true    Ran Out of Food in the Last Year: Never true  Transportation Needs: No Transportation Needs (03/01/2023)   PRAPARE - Administrator, Civil Service (Medical): No    Lack of Transportation (Non-Medical): No  Physical Activity: Not on file  Stress: Not on file  Social Connections: Not on file  Intimate Partner Violence: Not At Risk (03/01/2023)   Humiliation, Afraid, Rape, and Kick questionnaire    Fear of Current or Ex-Partner: No    Emotionally Abused: No    Physically Abused: No    Sexually Abused: No    Family History:   The patient's family history includes Heart attack in her brother; Heart disease in her brother and mother; Hypertension in her mother and sister; Kidney disease in her brother; Stroke in her father and sister.    ROS:  Please see the history of present illness.  All other ROS  reviewed and negative.     Physical Exam/Data:   Vitals:   03/09/23 0445 03/09/23 0500 03/09/23 0505 03/09/23 0523  BP: (!) 117/55 (!) 111/52    Pulse: 65 70 78   Resp: 15 (!) 25 (!) 29   Temp:      TempSrc:      SpO2: 94% 94% 91% (!) 83%  Weight:      Height:       No intake or output data in the 24 hours ending 03/09/23 0544    03/09/2023    4:23 AM 03/04/2023    3:55 AM 03/03/2023    4:23 AM  Last 3 Weights  Weight (lbs) 149 lb 146 lb 9.7 oz 147 lb 14.9 oz  Weight (kg) 67.586 kg 66.5 kg 67.1 kg     Body mass index is 28.15 kg/m.  General:  Well nourished, well developed, in acute distress HEENT: normal Neck: no JVD Vascular: No carotid bruits; Distal pulses 2+ bilaterally   Cardiac:  normal S1, S2; RRR; SEM RUSB  Lungs:  clear to auscultation bilaterally, no wheezing, rhonchi or rales  Abd: soft, nontender, no hepatomegaly  Ext: Chronic lymph edema, She does have a pulsatile L radial pulse with some buldging Skin: warm and dry  Neuro:  CNs 2-12 intact, no focal abnormalities noted Psych:  Normal affect    EKG:  The ECG that was done  was personally reviewed and demonstrates NSR, STE in III more than II and some STE in V6 with STD laterally.  Relevant CV Studies: Left Heart Catheterization 03/02/23:  Aortic valve was not crossed hence LV EDP not evaluated.   Angiographic data: LM: Diffusely diseased with diffuse 40 to 60% stenosis and continues to the same size into the LAD.   LAD: Proximal LAD has a 50 to 60% stenosis gives origin to a relatively large D1 which is again diffusely diseased but without any focal high-grade stenosis.  After the origin of D1, LAD has a focal 95% stenosis.  Competitive filling from the LIMA noted. LCx: Large vessel, proximal vessel is diffusely diseased and mildly ectatic.  It is occluded in the midsegment after origin of a very small OM1.  There is a very large OM 2  with secondary branch supplied by saphenous vein graft. RCA: Dominant,  occluded in the midsegment.  Diffusely diseased proximally.   LIMA to LAD: Widely patent. SVG to RCA: Patent with tandem 70% stenosis, mild ectasia noted in the midsegment of the body of the saphenous vein graft.  TIMI-3 flow is evident. 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  The aneurysms extend throughout the mid and distal segment of the graft probably aneurysmal length is about 60 to 80 mm.                                                                Recommendation: Patient is very frail 82 year old patient with severe anemia, would recommend outpatient GI evaluation to exclude any obvious GI source of bleeding.  Continue Eliquis for now and also consider adding 81 mg of aspirin already on Plavix only if cleared by GI.   I will discuss with my interventional cardiology colleagues regarding management options but given her frail nature, severe anemia, long aneurysmal segment and risk involving the SVG to OM 2 which is probably the culprit for her NSTEMI will be very difficult to treat with covered stents requiring long-term DAPT and attendant risk of bleeding complications.  Probably medical therapy for now and can be discharged with outpatient follow-up.  ECHO  IMPRESSIONS     1. Left ventricular ejection fraction, by estimation, is 55 to 60%. The  left ventricle has normal function. The left ventricle has no regional  wall motion abnormalities. There is mild concentric left ventricular  hypertrophy. Left ventricular diastolic  parameters are consistent with Grade II diastolic dysfunction  (pseudonormalization).   2. Right ventricular systolic function is low normal. The right  ventricular size is normal. There is normal pulmonary artery systolic  pressure. The estimated right ventricular systolic pressure is 24.7 mmHg.   3. Left  atrial size was mildly dilated.   4. Right atrial size was mildly dilated.   5. The mitral valve is degenerative. Mild to moderate mitral valve  regurgitation. No evidence of mitral stenosis. Moderate to severe mitral  annular calcification.   6. Tricuspid valve regurgitation is moderate.   7. The aortic valve is tricuspid. There is moderate calcification of the  aortic valve. Aortic valve regurgitation is trivial. Suspect severe  paradoxical low flow/low gradient aortic valve stenosis. Aortic valve  area, by VTI measures 0.79 cm. Aortic  valve mean gradient measures 24.0 mmHg.   8. The inferior vena cava is normal in size with greater than 50%  respiratory variability, suggesting right atrial pressure of 3 mmHg.   Laboratory Data:  High Sensitivity Troponin:   Recent Labs  Lab 03/01/23 1428 03/01/23 1703 03/02/23 0023  TROPONINIHS 509* 519* 628*      Chemistry Recent Labs  Lab 03/03/23 0626 03/04/23 0355  NA 136 135  K 4.0 3.7  CL 106 102  CO2 24 26  GLUCOSE 112* 121*  BUN 7* 10  CREATININE 0.94 0.98  CALCIUM 8.4* 8.1*  MG 2.1 2.0  GFRNONAA >60 58*  ANIONGAP 6 7    No results for input(s): "PROT", "ALBUMIN", "AST", "ALT", "  ALKPHOS", "BILITOT" in the last 168 hours. Lipids No results for input(s): "CHOL", "TRIG", "HDL", "LABVLDL", "LDLCALC", "CHOLHDL" in the last 168 hours. Hematology Recent Labs  Lab 03/04/23 0355 03/09/23 0441  WBC 7.5 6.2  RBC 3.74* 3.90  HGB 10.0* 10.6*  HCT 32.0* 34.2*  MCV 85.6 87.7  MCH 26.7 27.2  MCHC 31.3 31.0  RDW 15.5 16.6*  PLT 290 340   Thyroid No results for input(s): "TSH", "FREET4" in the last 168 hours. BNPNo results for input(s): "BNP", "PROBNP" in the last 168 hours.  DDimer No results for input(s): "DDIMER" in the last 168 hours.   Radiology/Studies:  DG Chest Port 1 View  Result Date: 03/09/2023 CLINICAL DATA:  Chest pain. EXAM: PORTABLE CHEST 1 VIEW COMPARISON:  03/01/2023. FINDINGS: The heart is enlarged and  the mediastinal contour is within normal limits. There is atherosclerotic calcification of the aorta. Interstitial prominence is noted bilaterally with mild airspace disease at the lung bases. No effusion or pneumothorax. A right chest port is stable in position. No acute osseous abnormality. IMPRESSION: Interstitial prominence bilaterally with increased airspace disease at the lung bases, possible edema or infiltrate. Electronically Signed   By: Thornell Sartorius M.D.   On: 03/09/2023 04:54     Assessment and Plan:   STEMI of SVG to OM graft 3V CABG LIMA-LAD, SVG-OM, SVG-RCA Concern for L radial pseudoaneurysm Severe PLFLG aortic stenosis AF on OAC  STE management per Dr. Copper Will place arterial doppler for L radial knot concerning for pseudoaneurysm ECHO ASA/plavix/apixaban duration TBD Home coreg/dilt Tele CCU bed     Risk Assessment/Risk Scores:   TIMI Risk Score for ST  Elevation MI:   The patient's TIMI risk score is 7, which indicates a 41% risk of all cause mortality at 30 days.{   CHA2DS2-VASc Score = 5 Code Status: Full Code  Severity of Illness: The appropriate patient status for this patient is INPATIENT. Inpatient status is judged to be reasonable and necessary in order to provide the required intensity of service to ensure the patient's safety. The patient's presenting symptoms, physical exam findings, and initial radiographic and laboratory data in the context of their chronic comorbidities is felt to place them at high risk for further clinical deterioration. Furthermore, it is not anticipated that the patient will be medically stable for discharge from the hospital within 2 midnights of admission.   * I certify that at the point of admission it is my clinical judgment that the patient will require inpatient hospital care spanning beyond 2 midnights from the point of admission due to high intensity of service, high risk for further deterioration and high frequency of  surveillance required.*   For questions or updates, please contact [facility] Please consult [email or web address] for contact info under     Signed, Adline Mango, Scott  03/09/2023 5:44 AM    Patient seen, examined. Available data reviewed. Agree with findings, assessment, and plan as outlined by Dr Piedad Climes.  Patient with ongoing chest discomfort, EKG showing posterolateral STEMI pattern, and known extensive saphenous vein graft disease.  After discussion of her case and considering her ongoing ischemic symptoms at rest, we elected to proceed emergently with cardiac catheterization and possible PCI as outlined above.  Plan to approach her case from right femoral as she has extensive bruising and a firm nodule at her left radial cath site that we will later assess with ultrasound.  Tonny Bollman, M.D. 03/09/2023 6:50 AM

## 2023-03-10 ENCOUNTER — Encounter (HOSPITAL_COMMUNITY): Payer: Medicare Other

## 2023-03-10 ENCOUNTER — Encounter (HOSPITAL_COMMUNITY): Payer: Self-pay | Admitting: Cardiovascular Disease

## 2023-03-10 DIAGNOSIS — I2121 ST elevation (STEMI) myocardial infarction involving left circumflex coronary artery: Secondary | ICD-10-CM

## 2023-03-10 DIAGNOSIS — Z951 Presence of aortocoronary bypass graft: Secondary | ICD-10-CM

## 2023-03-10 DIAGNOSIS — I238 Other current complications following acute myocardial infarction: Secondary | ICD-10-CM

## 2023-03-10 DIAGNOSIS — I25119 Atherosclerotic heart disease of native coronary artery with unspecified angina pectoris: Secondary | ICD-10-CM | POA: Diagnosis not present

## 2023-03-10 DIAGNOSIS — I25709 Atherosclerosis of coronary artery bypass graft(s), unspecified, with unspecified angina pectoris: Secondary | ICD-10-CM

## 2023-03-10 DIAGNOSIS — C541 Malignant neoplasm of endometrium: Secondary | ICD-10-CM

## 2023-03-10 DIAGNOSIS — I729 Aneurysm of unspecified site: Secondary | ICD-10-CM

## 2023-03-10 DIAGNOSIS — I350 Nonrheumatic aortic (valve) stenosis: Secondary | ICD-10-CM

## 2023-03-10 DIAGNOSIS — R600 Localized edema: Secondary | ICD-10-CM

## 2023-03-10 DIAGNOSIS — Z7189 Other specified counseling: Secondary | ICD-10-CM

## 2023-03-10 DIAGNOSIS — E7801 Familial hypercholesterolemia: Secondary | ICD-10-CM

## 2023-03-10 DIAGNOSIS — I480 Paroxysmal atrial fibrillation: Secondary | ICD-10-CM

## 2023-03-10 DIAGNOSIS — D509 Iron deficiency anemia, unspecified: Secondary | ICD-10-CM

## 2023-03-10 DIAGNOSIS — T81718A Complication of other artery following a procedure, not elsewhere classified, initial encounter: Secondary | ICD-10-CM | POA: Insufficient documentation

## 2023-03-10 DIAGNOSIS — I10 Essential (primary) hypertension: Secondary | ICD-10-CM

## 2023-03-10 LAB — CBC
HCT: 29.2 % — ABNORMAL LOW (ref 36.0–46.0)
Hemoglobin: 9.3 g/dL — ABNORMAL LOW (ref 12.0–15.0)
MCH: 27.5 pg (ref 26.0–34.0)
MCHC: 31.8 g/dL (ref 30.0–36.0)
MCV: 86.4 fL (ref 80.0–100.0)
Platelets: 286 10*3/uL (ref 150–400)
RBC: 3.38 MIL/uL — ABNORMAL LOW (ref 3.87–5.11)
RDW: 16.3 % — ABNORMAL HIGH (ref 11.5–15.5)
WBC: 7.2 10*3/uL (ref 4.0–10.5)
nRBC: 0 % (ref 0.0–0.2)

## 2023-03-10 LAB — BASIC METABOLIC PANEL
Anion gap: 9 (ref 5–15)
BUN: 11 mg/dL (ref 8–23)
CO2: 23 mmol/L (ref 22–32)
Calcium: 8.6 mg/dL — ABNORMAL LOW (ref 8.9–10.3)
Chloride: 103 mmol/L (ref 98–111)
Creatinine, Ser: 0.96 mg/dL (ref 0.44–1.00)
GFR, Estimated: 59 mL/min — ABNORMAL LOW (ref >60)
Glucose, Bld: 115 mg/dL — ABNORMAL HIGH (ref 70–99)
Potassium: 4.4 mmol/L (ref 3.5–5.1)
Sodium: 135 mmol/L (ref 135–145)

## 2023-03-10 MED ORDER — CARVEDILOL 3.125 MG PO TABS
3.1250 mg | ORAL_TABLET | Freq: Two times a day (BID) | ORAL | Status: DC
  Administered 2023-03-11 – 2023-03-15 (×9): 3.125 mg via ORAL
  Filled 2023-03-10 (×9): qty 1

## 2023-03-10 MED ORDER — FUROSEMIDE 20 MG PO TABS
20.0000 mg | ORAL_TABLET | Freq: Once | ORAL | Status: AC
  Administered 2023-03-10: 20 mg via ORAL
  Filled 2023-03-10: qty 1

## 2023-03-10 MED ORDER — ALBUTEROL SULFATE (2.5 MG/3ML) 0.083% IN NEBU: 2.5000 mg | INHALATION_SOLUTION | Freq: Four times a day (QID) | RESPIRATORY_TRACT | Status: DC | PRN

## 2023-03-10 MED FILL — Verapamil HCl IV Soln 2.5 MG/ML: INTRAVENOUS | Qty: 2 | Status: AC

## 2023-03-10 NOTE — TOC Initial Note (Signed)
Transition of Care [facility]) - Initial/Assessment Note    Patient Details  Name: Shannon Scott MRN: [id number] Date of Birth: [birth year]  Transition of [facility]) CM/SW Contact:    Gala Lewandowsky, RN Phone Number: 03/10/2023, 11:39 AM  Clinical Narrative:  Patient presented for [facility]- emergent PCI. Patient has insurance and PCP. PTA patient was from home alone; patient has support of children. Son at the bedside and he takes the patient to appointments. No home needs identified at the time of the visit. Case Manager will continue to follow as the patient progresses.               Expected Discharge Plan: Home/Self Care Barriers to Discharge: No Barriers Identified   Patient Goals and CMS Choice Patient states their goals for this hospitalization and ongoing recovery are:: plan to return home   Choice offered to / list presented to : NA     Expected Discharge Plan and Services   Discharge Planning Services: NA Post Acute Care Choice: NA Living arrangements for the past 2 months: Single Family Home                 DME Arranged: N/A    Prior Living Arrangements/Services Living arrangements for the past 2 months: Single Family Home Lives with:: Self (has support of children (son at bedside)) Patient language and need for interpreter reviewed:: Yes Do you feel safe going back to the place where you live?: Yes      Need for Family Participation in Patient Care: Yes (Comment) Care giver support system in place?: Yes (comment) Current home services: DME (cane and rolling walker) Criminal Activity/Legal Involvement Pertinent to Current Situation/Hospitalization: No - Comment as needed  Permission Sought/Granted Permission sought to share information with : Family Supports, Case Manager   Emotional Assessment Appearance:: Appears stated age Attitude/Demeanor/Rapport: Engaged Affect (typically observed): Appropriate   Alcohol / Substance Use: Not Applicable Psych  Involvement: No (comment)  Admission diagnosis:  ST elevation myocardial infarction involving right coronary artery (HCC) [I21.11] STEMI involving left circumflex coronary artery (HCC) [I21.21] Patient Active Problem List   Diagnosis Date Noted   Coronary artery disease involving native coronary artery of native heart with angina pectoris (HCC) 03/10/2023   Coronary artery disease involving coronary bypass graft of native heart with angina pectoris (HCC) 03/10/2023   Pseudoaneurysm following procedure (HCC) -R Radial Artery 03/10/2023   STEMI involving saphenous vein graft to left circumflex coronary artery (HCC) 03/09/2023   Severe aortic stenosis 03/03/2023   Recent non-STEMI 03/01/2023   Edema of left lower extremity 03/02/2022   Dyslipidemia 03/02/2022   Hypothyroidism 03/02/2022   Paroxysmal atrial fibrillation (HCC) 03/02/2022   Chest pain 03/01/2022   Vulvar irritation 01/02/2021   Stroke-like episode    Acute CVA (cerebrovascular accident) (HCC) 03/28/2020   Stroke (HCC) 03/26/2020   Expressive aphasia 03/26/2020   Acute vaginitis 01/03/2020   Acute vulvitis 01/03/2020   Lower extremity edema 11/09/2018   Back pain 11/09/2018   IDA (iron deficiency anemia) 10/21/2017   Abdominal mass    Deep vein thrombosis (DVT) of left lower extremity (HCC)    Pelvic mass    Acute blood loss anemia    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 (HCC) 10/11/2017   Goals of care, counseling/discussion 10/11/2017   Rectal bleeding 10/03/2017   Normocytic anemia 10/03/2017   Left hip pain 10/03/2017   Generalized weakness 10/03/2017   Colostomy status (HCC) 08/30/2017   Ureteral  stent displacement, subsequent encounter 08/30/2017   Hypophosphatemia 08/30/2017   Hypomagnesemia 08/30/2017   Acute blood loss as cause of postoperative anemia 08/30/2017   Chronic anemia 08/30/2017   Left leg swelling 08/30/2017   GERD (gastroesophageal reflux disease)  08/30/2017   Atrial fibrillation, chronic (HCC)    Bowel perforation (HCC) 08/18/2017   Colonic mass 08/18/2017   Ureteral dilatation 08/18/2017   Intractable right heel pain 10/07/2016   Plantar fasciitis of right foot 10/07/2016   Postoperative nausea and vomiting 01/02/2016   Hyponatremia 01/01/2016   Acute hypokalemia 01/01/2016   Endometrial cancer (HCC) 12/30/2015   Familial hypercholesteremia 07/28/2015   S/P CABG x 3 01/13/2013   Palpitations 01/13/2013   Medication intolerance 01/13/2013   Chronic pain 01/13/2013   TIA (transient ischemic attack) 01/13/2013   Carotid stenosis 01/13/2013   Hypothyroid 10/05/2010   Essential hypertension 10/05/2010   Gastroesophagitis 10/05/2010   Atrophic gastritis 10/05/2010   PCP:  [name], MD Pharmacy:   [facility] [phone] - [place], [place] - [address] AT [facility] [address] [place] [address] Phone: [phone] Fax: [phone]  [facility] [address] [place] [address] Phone: [phone] Fax: [phone]  Social Determinants of Health (SDOH) Social History: SDOH Screenings   Food Insecurity: No Food Insecurity (03/01/2023)  Housing: Low Risk  (03/01/2023)  Transportation Needs: No Transportation Needs (03/01/2023)  Utilities: Not At Risk (03/01/2023)  Tobacco Use: Low Risk  (03/09/2023)   Readmission Risk Interventions     No data to display

## 2023-03-10 NOTE — Plan of Care (Signed)
Mobilized OOB to chair x2.  Drs. Herbie Baltimore and [place] made aware of Lt. Wrist/Forearm hematoma.  Pressure dressing place this morning by Dr. Herbie Baltimore.  Continuing to monitor for changes.  Currently stable level 1

## 2023-03-10 NOTE — Progress Notes (Addendum)
Patient Name: Shannon Scott Date of Encounter: 03/10/2023 [facility] Cardiologist: Chrystie Nose, MD    Patient Profile:    Shannon Scott is a 82 y.o. female with hx of 3V CABG LIMA-LAD, SVG-OM, SVG-RCA, severe aortic stenosis, locally advanced endometrial cancer status post chemoradiation, immunotherapy, hysterectomy BSO, colonic mass with partial colectomy and colostomy, prior DVT, A-fib on apixaban, hypertension, hyperlipidemia, statin intolerance, lymphedema, asthma, hypothyroidism was admitted in the morning of 03/09/2023 with an Inferior STEMI -> underwent DES PCI of severe lesion in SVG-OM restoring TIMI-3 flow.  Otherwise stable extensive CAD in both native and grafts.   Interval Summary  .    Looks and feels much better today.  Very happy to have the IVs out of her antecubital areas.  Ate breakfast well this morning.  Much more awake and alert.  Had been up and to the bedside commode but not more than that.  Has a little bit of tenderness on the left wrist but not worrisome.  More complaining of right-sided neck pain  Assessment & Plan .     Principal Problem:   STEMI involving saphenous vein graft to left circumflex coronary artery (HCC) Active Problems:   Recent non-STEMI   Severe aortic stenosis   Coronary artery disease involving native coronary artery of native heart with angina pectoris (HCC)   Coronary artery disease involving coronary bypass graft of native heart with angina pectoris (HCC)   Endometrial cancer (HCC)   S/P CABG x 3   Paroxysmal atrial fibrillation (HCC)   Essential hypertension   Familial hypercholesteremia   Lower extremity edema   IDA (iron deficiency anemia)   Goals of care, counseling/discussion   Pseudoaneurysm following procedure (HCC) -R Radial Artery   Principal Problem:   STEMI involving saphenous vein graft to left circumflex coronary artery (HCC) / Recent non-STEMI    Coronary artery disease involving native  coronary artery of native heart with angina pectoris (HCC)      S/P CABG x 3 Coronary artery disease involving  coronary bypass graft of native heart with angina pectoris (HCC) => DES PCI SVG-OM With 1 stent placed, plan is triple therapy with ASA/Plavix/Eliquis for 2 weeks and then DC aspirin.   Would only do Plavix uninterrupted for 6 months (continue longer if tolerated)  On Zetia.  Has history of statin intolerance.  Lipids are very poorly controlled, consider whether or not she would be a candidate for PCSK9 inhibitor based on the fact of her now moving more toward palliative management of her CAD and aortic stenosis.   Consider converting to Nexlizet Previously felt to have no other PTCA options with remaining CAD.  HFpEF Other than edema, seems to be euvolemic from a pulmonary edema standpoint. He is on carvedilol.  But also on diltiazem as opposed to standard afterload reduction because of A-fib Will give 1 dose of oral Lasix today and assess urine output/effectiveness..   Active Problems:      Severe aortic stenosis - (moderate by gradient , but concern for paradoxical low flow/low gradient)    (Per Dr. Excell Seltzer - as part of Structural Heart Team: Pt did well with PCI of the SVG-OM today after presenting with STEMI. We had discussed her case yesterday in our valve team meeting. I told her family that she will not be a candidate for TAVR with her MI and high risk of recurrent MI as well as her other comorbidities.) For now we will just simply continue to monitor  and treat medically.    Endometrial cancer (HCC)     Paroxysmal atrial fibrillation (HCC)-with hypercoagulable state This patients CHA2DS2-VASc Score and unadjusted Ischemic Stroke Rate (% per year) is equal to 10.8 % stroke rate/year from a score of 8  Above score calculated as 1 point each if present [CHF, HTN, DM, Vascular=MI/PAD/Aortic Plaque, Age if 65-74, or Female] Above score calculated as 2 points each if present [Age  > 75, or Stroke/TIA/TE]  Maintaining sinus rhythm here. Restart Eliquis today. Continue carvedilol plus diltiazem for rate control    Essential hypertension   Familial hypercholesteremia   Lower extremity edema - chronic Lymphedema / Venous Stasis No good options for treatment.  Recommendations has been foot elevation    IDA (iron deficiency anemia) -> notable 2 point drop in hemoglobin from initial post cath, but had previously been 10.6.  No active signs of bleeding.  Had transfusion during last hospitalization bring her hemoglobin up to 10. =>  Will continue to monitor but avoid excess blood draws.  Would like to try to avoid additional transfusion and maybe consider IV iron.  Will discuss with heme-onc.  Continue on thyroid doses for hypothyroidism.      Pseudoaneurysm following procedure (HCC) - L Radial Artery Will ask vascular surgery to take a look and see if there is anything that needs to be done.   I suspect this may be significant managed with compression. => Compression dressing applied Barbeau & Reverse Barbeau A.   Right side neck pain is musculoskeletal very tense muscles.  Will apply warm compress and gentle massage.  Goals of care, counseling/discussion Plan is to move her around and ambulate  Vital Signs .    Vitals:   03/09/23 2130 03/09/23 2200 03/09/23 2300 03/10/23 0000  BP: (!) 117/43 (!) 98/39 (!) 100/30 (!) 118/54  Pulse:  69 70 74  Resp:  20 20 (!) 25  Temp:    98.7 F (37.1 C)  TempSrc:    Oral  SpO2:  94% 96% 94%  Weight:      Height:        Intake/Output Summary (Last 24 hours) at 03/10/2023 0234 Last data filed at 03/09/2023 2327 Gross per 24 hour  Intake 891.24 ml  Output 740 ml  Net 151.24 ml      03/09/2023    4:23 AM 03/04/2023    3:55 AM 03/03/2023    4:23 AM  Last 3 Weights  Weight (lbs) 149 lb 146 lb 9.7 oz 147 lb 14.9 oz  Weight (kg) 67.586 kg 66.5 kg 67.1 kg      Telemetry/ECG    Sinus- Personally Reviewed  Computer  read as accelerated junctional rhythm.  Appears to be more sinus rhythm with LVH and repolarization changes.  ST depressions in anterior leads are definitely more pronounced but there are no longer ST elevations in the inferior leads.   CV Studies Summarized.    Recent Admission Cardiac Cath-Coronaries and Grafts 10/2/204: Severe/borderline occlusive native CAD with LIMA LAD patent extensive disease in SVG-RCA (tandem 70% stenosis along with ectasia, but TIMI-3 flow), large patulous SVG-OM 2 with focal 95% stenosis proximally and post-stenotic tandem aneurysmal segments.  Not favorable for PCI..=> Recommendation was to treat medically can consider adding aspirin to existing Thienopyridine and DOAC.  Not felt to be febrile for PCI.   TTE 03/03/2023: EF 55 to 60%.  No notable RWMA.  GR 2 DD.  Borderline RAP.  Mild biatrial enlargement.  Moderate to severe MAC  with mild to moderate MR.  Severely calcified aortic valve with potential paradoxical low-flow/low gradient AS but mean gradient measured 24 mmHg.  Current Admission  Cath-PCI 10/9: Post- Lat STEMI: Stable disease with exception of acute total occlusion of SVG-OM at previous 95% lesion site-DES PCI 3.0 mm x 12 mm Synergy XD DES.  (Plan was to preserve apixaban on 1010, aspirin x 2 weeks and clopidogrel for 6 months but favor longer-term if possible)  TTE 03/09/2023: EF 55 to 60% with anterolateral hypokinesis.  Moderate MR with severe MAC.  Mild to moderate TR.  Calcified aortic valve with measured moderate AAS.  Mildly elevated PAP.  Normal RAP. Right Upper Extremity Ultrasound10/01/2023: Pseudoaneurysm seen at distal radial artery measuring 0.6 x 0.4 cm with a 0.3 mm neck.   Physical Exam .    GEN: No acute distress.  Chronically ill-appearing elderly woman. Neck: No JVD Cardiac: RRR, Normal S1 & S2.Marland KitchenHarsh 3/6 SEM @ RUSB. No rubs, or gallops.  Respiratory: Clear to auscultation bilaterally. GI: Soft, nontender, non-distended  MS: 3-4 +  LLE & 3+ RLE pitting edema with erythema; L radial cath site with firm nodule - sore but not pulsatile.  Barbeaeu & R Tora Perches are both normal. Ulnar distribution ecchymoses   For questions or updates, please contact [facility] Please consult [email or web address] for contact info under        Signed, Bryan Lemma, MD

## 2023-03-10 NOTE — Consult Note (Addendum)
Hospital Consult    Reason for Consult:  left radial artery pseudoaneurysm Requesting Physician:  Herbie Baltimore MD MRN #:  [id number]  History of Present Illness: Shannon Scott is a 82 y.o. female with a past medical history of severe aortic stenosis, locally advanced endometrial cancer, atrial fibrillation, hypertension, hyperlipidemia, CAD s/p CABG x 3, and NSTEMI who was admitted to the hospital yesterday after STEMI involving the saphenous vein graft to the left circumflex coronary artery.  She underwent emergent PCI with saphenous vein graft DES, via right femoral artery access.   She recently underwent diagnostic cardiac cath on 03/02/2023 via left radial artery access.  We were consulted for management of a left radial artery pseudoaneurysm.  The patient denies any left hand pain, weakness, or numbness since cardiac cath.  She has had a small amount of bruising around her left forearm.  She states her left wrist only hurts with deep pressure.  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) 10/11/2017   Postoperative nausea and vomiting 01/02/2016    Past Surgical History:  Procedure Laterality Date   ABDOMINAL HYSTERECTOMY     Carotid Doppler  02/2012   40-59% right int carotid artery stenosis; 60-79% L int carotid stenosis; L carotid bruit   CORONARY ARTERY BYPASS GRAFT  03/12/2004   LIMA to LAD, SVG  to circumflex, SVG to PDA   CORONARY/GRAFT ACUTE MI REVASCULARIZATION N/A 03/09/2023   Procedure: Coronary/Graft Acute MI Revascularization;  Surgeon: Tonny Bollman, MD;  Location: [facility] INVASIVE CV LAB;  Service: Cardiovascular;  Laterality: N/A;   CORONARY/GRAFT ANGIOGRAPHY N/A 03/02/2023   Procedure: CORONARY/GRAFT ANGIOGRAPHY;  Surgeon: Yates Decamp, MD;  Location: [facility] INVASIVE CV LAB;  Service: Cardiovascular;  Laterality: N/A;   CYSTOSCOPY W/ URETERAL STENT PLACEMENT Left 12/06/2017   Procedure: CYSTOSCOPY WITH LEFT URETERAL STENT EXCHANGE;  Surgeon: Bjorn Pippin, MD;  Location: [facility] ORS;  Service: Urology;  Laterality: Left;   CYSTOSCOPY WITH STENT PLACEMENT Bilateral 08/21/2017   Procedure: CYSTOSCOPY WITH STENT PLACEMENT;  Surgeon: Rene Paci, MD;  Location: [facility] ORS;  Service: Urology;  Laterality: Bilateral;   IR FLUORO GUIDE PORT INSERTION RIGHT  10/11/2017   IR GENERIC HISTORICAL  04/29/2016   IR RADIOLOGIST EVAL & MGMT 04/29/2016 Simonne Come, MD [facility] INTERV RAD   IR GENERIC HISTORICAL  05/12/2016   IR RADIOLOGIST EVAL & MGMT 05/12/2016 Simonne Come, MD [facility] INTERV RAD   IR IVC FILTER PLMT / S&I Lenise Arena GUID/MOD SED  10/11/2017   IR US GUIDE VASC ACCESS RIGHT  10/11/2017   ROBOTIC ASSISTED TOTAL HYSTERECTOMY WITH BILATERAL SALPINGO OOPHERECTOMY Bilateral 12/30/2015   Procedure: XI ROBOTIC ASSISTED TOTAL HYSTERECTOMY WITH BILATERAL SALPINGO OOPHORECTOMY WITH SENTAL LYMPH NODE BIOPSY;  Surgeon: Cleda Mccreedy, MD;  Location: [facility];  Service: Gynecology;  Laterality: Bilateral;   TONSILLECTOMY     TRANSTHORACIC  ECHOCARDIOGRAM  04/2007   EF>55%; mild MR; mild-mod TR; mild pulm HTN; mild calcification of aortiv valve leaflets with mild valvular aortic stenosis   TUBAL LIGATION      Allergies  Allergen Reactions   Statins Other (See Comments)    Myalgias and memory problems   Ciprofloxacin Itching and Other (See Comments)    Splotchy redness with itching during IV infusion localized to  arm. Other reaction(s): itching/swelling/redness at injection site    Prior to Admission medications   Medication Sig Start Date End Date Taking? Authorizing Provider  apixaban (ELIQUIS) 5 MG TABS tablet Take 1 tablet (5 mg total) by mouth 2 (two) times daily. 03/29/20   Miguel Rota, MD  aspirin 81 MG chewable tablet Chew 1 tablet (81 mg total) by mouth daily. 03/05/23   Glade Lloyd, MD  carvedilol (COREG) 6.25 MG tablet Take 1 tablet (6.25 mg total) by mouth 2 (two) times daily with a meal. 03/04/23   Glade Lloyd, MD  clobetasol ointment (TEMOVATE) 0.05 % Apply 1 Application topically 2 (two) times a week. 06/24/22   Antionette Char, MD  diltiazem (TIAZAC) 300 MG 24 hr capsule Take 1 capsule (300 mg total) by mouth at bedtime. 03/04/23   Glade Lloyd, MD  ezetimibe (ZETIA) 10 MG tablet Take 10 mg by mouth daily. 06/05/21   [provider]  levothyroxine (SYNTHROID, LEVOTHROID) 50 MCG tablet Take 1 tablet (50 mcg total) by mouth daily before breakfast. 10/25/17   Tanner, Kathrin Greathouse., PA-C  mometasone (ELOCON) 0.1 % cream Apply 1 Application topically daily as needed (to the vulva for irritation and itching). 05/18/22   Cross, Efraim Kaufmann D, NP  nitroGLYCERIN (NITROSTAT) 0.4 MG SL tablet Place 1 tablet (0.4 mg total) under the tongue every 5 (five) minutes as needed for chest pain. 03/04/23   Glade Lloyd, MD  polyethylene glycol (MIRALAX / GLYCOLAX) 17 g packet Take 17 g by mouth daily as needed for severe constipation. Patient taking differently: Take 17 g by mouth daily. 03/29/20   Miguel Rota, MD  REPATHA SURECLICK 140 MG/ML SOAJ INJECT 1 DOSE UNDER THE SKIN EVERY 14 DAYS 06/21/22   Hilty, Lisette Abu, MD  traMADol (ULTRAM) 50 MG tablet TAKE 2 TABLETS(100 MG) BY MOUTH TWICE DAILY 02/04/23   Josph Macho, MD    Social History   Socioeconomic History   Marital status: Widowed    Spouse name: Not on file   Number of children: 2   Years of education: Not on file   Highest  education level: Not on file  Occupational History    Employer: RETIRED  Tobacco Use   Smoking status: Never   Smokeless tobacco: Never  Vaping Use   Vaping status: Never Used  Substance and Sexual Activity   Alcohol use: No   Drug use: No   Sexual activity: Not Currently  Other Topics Concern   Not on file  Social History Narrative   Not on file   Social Determinants of Health   Financial Resource Strain: Not on file  Food Insecurity: No Food Insecurity (03/01/2023)   Hunger Vital Sign    Worried About Running Out of Food in the Last Year: Never true    Ran Out of Food in the Last Year: Never true  Transportation Needs: No Transportation Needs (03/01/2023)   PRAPARE - Administrator, Civil Service (Medical): No    Lack of Transportation (Non-Medical): No  Physical Activity: Not on file  Stress: Not  on file  Social Connections: Not on file  Intimate Partner Violence: Not At Risk (03/01/2023)   Humiliation, Afraid, Rape, and Kick questionnaire    Fear of Current or Ex-Partner: No    Emotionally Abused: No    Physically Abused: No    Sexually Abused: No     Family History  Problem Relation Age of Onset   Hypertension Mother    Heart disease Mother        Died in her 66s   Stroke Father    Kidney disease Brother    Heart disease Brother        also HTN, hyperlipidemia   Heart attack Brother    Stroke Sister        x2   Hypertension Sister     ROS: Otherwise negative unless mentioned in HPI  Physical Examination  Vitals:   03/10/23 0700 03/10/23 0904  BP: (!) 93/52   Pulse: 74   Resp: (!) 25   Temp:  98.1 F (36.7 C)  SpO2: 92%    Body mass index is 28.15 kg/m.  General:  WDWN in NAD Gait: Not observed HENT: WNL, normocephalic Pulmonary: normal non-labored breathing Cardiac: regular, systolic murmur Abdomen:  soft, NT/ND, no masses Skin: without rashes Vascular Exam/Pulses: palpable left radial pulse.  Extremities: left forearm  compartments soft, minimal bruising. Small area of pulsatility around the distal left radial artery Musculoskeletal: no muscle wasting or atrophy  Neurologic: A&O X 3;  No focal weakness or paresthesias are detected; speech is fluent/normal Psychiatric:  The pt has Normal affect. Lymph:  Unremarkable  CBC    Component Value Date/Time   WBC 7.2 03/10/2023 0246   RBC 3.38 (L) 03/10/2023 0246   HGB 9.3 (L) 03/10/2023 0246   HGB 9.0 (L) 03/01/2023 1210   HCT 29.2 (L) 03/10/2023 0246   PLT 286 03/10/2023 0246   PLT 277 03/01/2023 1210   MCV 86.4 03/10/2023 0246   MCH 27.5 03/10/2023 0246   MCHC 31.8 03/10/2023 0246   RDW 16.3 (H) 03/10/2023 0246   LYMPHSABS 0.7 03/09/2023 0441   MONOABS 0.2 03/09/2023 0441   EOSABS 0.1 03/09/2023 0441   BASOSABS 0.0 03/09/2023 0441    BMET    Component Value Date/Time   NA 135 03/10/2023 0246   NA 136 (A) 09/08/2017 0000   K 4.4 03/10/2023 0246   CL 103 03/10/2023 0246   CO2 23 03/10/2023 0246   GLUCOSE 115 (H) 03/10/2023 0246   BUN 11 03/10/2023 0246   BUN 8 09/08/2017 0000   CREATININE 0.96 03/10/2023 0246   CREATININE 0.82 03/01/2023 1210   CREATININE 0.69 08/02/2016 1519   CALCIUM 8.6 (L) 03/10/2023 0246   GFRNONAA 59 (L) 03/10/2023 0246   GFRNONAA >60 03/01/2023 1210   GFRAA >60 02/05/2020 1045    COAGS: Lab Results  Component Value Date   INR 1.5 (H) 03/09/2023   INR 1.5 (H) 03/02/2023   INR 1.5 (H) 12/18/2020     Non-Invasive Vascular Imaging:   LUE Arterial Duplex An area with well defined borders measuring 0.6 cm x 0.4 cm was visualized  arising off of the left radial artery distal with ultrasound  characteristics of a pseudoaneurysm. The neck measures approximately 0.3  cm wide and 0.3 cm long.      Left Doppler Findings:  +--------------+----------+--------+--------+--------+  Site         PSV (cm/s)WaveformStenosisComments  +--------------+----------+--------+--------+--------+  Subclavian Mid80         biphasic                  +--------------+----------+--------+--------+--------+  Axillary     88        biphasic                  +--------------+----------+--------+--------+--------+  Brachial Dist 106       biphasic                  +--------------+----------+--------+--------+--------+  Radial Dist   69        biphasic                  +--------------+----------+--------+--------+--------+  Ulnar Dist    67        biphasic                  +--------------+----------+--------+--------+--------+   Statin:  Yes.   Beta Blocker:  Yes.   Aspirin:  Yes.   ACEI:  No. ARB:  No. CCB use:  Yes Other antiplatelets/anticoagulants:  Yes.   Plavix, Eliquis   ASSESSMENT/PLAN: This is a 82 y.o. female with a left radial artery pseudoaneurysm  -The patient was admitted yesterday after emergent PCI with DES of the saphenous vein graft to the left circumflex coronary artery.  This was via right femoral artery access.  On exam her right groin access site is soft, nontender, and without hematoma. -She recently underwent diagnostic cardiac cath via left radial artery access on 03/02/2023.  Her left wrist has felt a little bit sore after this procedure. -Arterial duplex demonstrates a very small distal left radial artery pseudoaneurysm, measuring 0.6 cm x 0.4 cm -On exam she has a palpable left radial pulse.  There is a small amount of pulsatility surrounding her pseudoaneurysm with no active bleeding into the forearm.  Her left forearm has some old, mild bruising.  Her forearm compartments are soft. -Given how small the patient's pseudoaneurysm is, this can likely be treated with ultrasound-guided compressive therapy.  She likely would not be an operative candidate given her severe aortic stenosis and MI history. -The patient can continue aspirin and plavix.  She did get a dose of Eliquis this morning. Please hold any further doses of Eliquis until pseudoaneurysm has  thrombosed -Dr. Lenell Antu to see the patient and provide further treatment recommendations   Loel Dubonnet PA-C [facility] [phone]  \  VASCULAR STAFF ADDENDUM: I have independently interviewed and examined the patient. I agree with the above.  Will request US guided compression x 20 minutes with vascular lab. Anticipate that small pseudoaneurysm will resolve without surgical therapy. Hold all antiplatelet / anticoagulation as able.  Rande Brunt. Lenell Antu, MD [facility] Phone Number: [phone] 03/10/2023 1:18 PM

## 2023-03-11 ENCOUNTER — Telehealth (HOSPITAL_COMMUNITY): Payer: Self-pay | Admitting: Pharmacy Technician

## 2023-03-11 ENCOUNTER — Inpatient Hospital Stay: Payer: Medicare Other | Admitting: Gynecologic Oncology

## 2023-03-11 ENCOUNTER — Other Ambulatory Visit (HOSPITAL_COMMUNITY): Payer: Self-pay

## 2023-03-11 DIAGNOSIS — C541 Malignant neoplasm of endometrium: Secondary | ICD-10-CM

## 2023-03-11 DIAGNOSIS — E7801 Familial hypercholesterolemia: Secondary | ICD-10-CM | POA: Diagnosis not present

## 2023-03-11 DIAGNOSIS — I238 Other current complications following acute myocardial infarction: Secondary | ICD-10-CM | POA: Diagnosis not present

## 2023-03-11 DIAGNOSIS — Z951 Presence of aortocoronary bypass graft: Secondary | ICD-10-CM | POA: Diagnosis not present

## 2023-03-11 DIAGNOSIS — I2121 ST elevation (STEMI) myocardial infarction involving left circumflex coronary artery: Secondary | ICD-10-CM | POA: Diagnosis not present

## 2023-03-11 LAB — CBC
HCT: 26.6 % — ABNORMAL LOW (ref 36.0–46.0)
Hemoglobin: 8.3 g/dL — ABNORMAL LOW (ref 12.0–15.0)
MCH: 27.6 pg (ref 26.0–34.0)
MCHC: 31.2 g/dL (ref 30.0–36.0)
MCV: 88.4 fL (ref 80.0–100.0)
Platelets: 258 10*3/uL (ref 150–400)
RBC: 3.01 MIL/uL — ABNORMAL LOW (ref 3.87–5.11)
RDW: 16.5 % — ABNORMAL HIGH (ref 11.5–15.5)
WBC: 5.7 10*3/uL (ref 4.0–10.5)
nRBC: 0 % (ref 0.0–0.2)

## 2023-03-11 LAB — BASIC METABOLIC PANEL
Anion gap: 8 (ref 5–15)
BUN: 11 mg/dL (ref 8–23)
CO2: 24 mmol/L (ref 22–32)
Calcium: 8.1 mg/dL — ABNORMAL LOW (ref 8.9–10.3)
Chloride: 103 mmol/L (ref 98–111)
Creatinine, Ser: 0.84 mg/dL (ref 0.44–1.00)
GFR, Estimated: >60 mL/min (ref >60)
Glucose, Bld: 99 mg/dL (ref 70–99)
Potassium: 4 mmol/L (ref 3.5–5.1)
Sodium: 135 mmol/L (ref 135–145)

## 2023-03-11 LAB — MAGNESIUM: Magnesium: 1.8 mg/dL (ref 1.7–2.4)

## 2023-03-11 MED ORDER — NEXLETOL 180 MG PO TABS: 180.0000 mg | ORAL_TABLET | Freq: Every day | ORAL | 2 refills | Status: DC

## 2023-03-11 MED ORDER — DILTIAZEM HCL ER COATED BEADS 180 MG PO CP24
180.0000 mg | ORAL_CAPSULE | Freq: Every day | ORAL | Status: DC
  Administered 2023-03-11 – 2023-03-14 (×4): 180 mg via ORAL
  Filled 2023-03-11 (×5): qty 1

## 2023-03-11 MED ORDER — ALBUTEROL SULFATE (2.5 MG/3ML) 0.083% IN NEBU
2.5000 mg | INHALATION_SOLUTION | Freq: Four times a day (QID) | RESPIRATORY_TRACT | Status: DC
  Administered 2023-03-11 – 2023-03-12 (×4): 2.5 mg via RESPIRATORY_TRACT
  Filled 2023-03-11 (×5): qty 3

## 2023-03-11 MED ORDER — PHENOL 1.4 % MT LIQD
1.0000 | OROMUCOSAL | Status: DC | PRN
  Filled 2023-03-11: qty 177

## 2023-03-11 MED ORDER — FUROSEMIDE 20 MG PO TABS: 20.0000 mg | ORAL_TABLET | Freq: Once | ORAL | Status: DC

## 2023-03-11 MED ORDER — FUROSEMIDE 20 MG PO TABS
20.0000 mg | ORAL_TABLET | ORAL | Status: DC
  Administered 2023-03-14: 20 mg via ORAL
  Filled 2023-03-11 (×2): qty 1

## 2023-03-11 NOTE — Progress Notes (Signed)
Patient Name: Shannon Scott Date of Encounter: 03/11/2023 [facility] Cardiologist: Chrystie Nose, MD    Patient Profile:    Shannon Scott is a 82 y.o. female with hx of 3V CABG LIMA-LAD, SVG-OM, SVG-RCA, severe aortic stenosis, locally advanced endometrial cancer status post chemoradiation, immunotherapy, hysterectomy BSO, colonic mass with partial colectomy and colostomy, prior DVT, A-fib on apixaban, hypertension, hyperlipidemia, statin intolerance, lymphedema, asthma, hypothyroidism was admitted in the morning of 03/09/2023 with an Inferior STEMI -> underwent DES PCI of severe lesion in SVG-OM restoring TIMI-3 flow.  Otherwise stable extensive CAD in both native and grafts.   Interval Summary  .    Feels better - still with some coughing.  MIld blood tinged sputum - from nasal dryness. No CP.   Did not feel any XS diuresis with PO Lasix  Assessment & Plan .     Principal Problem:   STEMI involving saphenous vein graft to left circumflex coronary artery (HCC) Active Problems:   Recent non-STEMI   Severe aortic stenosis   Coronary artery disease involving native coronary artery of native heart with angina pectoris (HCC)   Coronary artery disease involving coronary bypass graft of native heart with angina pectoris (HCC)   Endometrial cancer (HCC)   S/P CABG x 3   Paroxysmal atrial fibrillation (HCC)   Essential hypertension   Familial hypercholesteremia   Lower extremity edema   IDA (iron deficiency anemia)   Goals of care, counseling/discussion   Pseudoaneurysm following procedure (HCC) -R Radial Artery   Principal Problem:   STEMI involving saphenous vein graft to left circumflex coronary artery (HCC) / Recent non-STEMI    Coronary artery disease involving native coronary artery of native heart with angina pectoris (HCC)      S/P CABG x 3 Coronary artery disease involving  coronary bypass graft of native heart with angina pectoris (HCC) => DES PCI  SVG-OM 1 stent placed, plan initial plan was ASA/Plavix/Eliquis for 2 weeks and then DC aspirin.  => with PSA of LRad & notable bruising, will hold DOAC while on ASA & restart once ASA off.  Would only do Plavix uninterrupted for 6 months (continue longer if tolerated)  On Zetia.  Has history of statin intolerance -- on Repatha; convert to Nexlizet on d/c Consider converting to Nexlizet Previously felt to have no other PTCA options with remaining CAD. => Continue Med Rx.   HFpEF Seems Euvolemic (BLE is related to Lymphedema) Reduced dose of carvedilol.  But also on diltiazem as opposed to standard afterload reduction because of A-fib Responded to PO Lasix with modest UOP -- re-dose today & will consider every other day (MWF) dosing on d/c after 5 d of daily  Will order Nebs x 1 & PRN.    Active Problems:      Severe aortic stenosis - (moderate by gradient , but concern for paradoxical low flow/low gradient)    (Per Dr. Excell Seltzer - as part of Structural Heart Team: Pt did well with PCI of the SVG-OM today after presenting with STEMI. We had discussed her case yesterday in our valve team meeting. I told her family that she will not be a candidate for TAVR with her MI and high risk of recurrent MI as well as her other comorbidities.) For now we will just simply continue to monitor and treat medically.    Endometrial cancer (HCC) Per Heme Onc      Paroxysmal atrial fibrillation (HCC)-with hypercoagulable state This patients CHA2DS2-VASc Score  and unadjusted Ischemic Stroke Rate (% per year) is equal to 10.8 % stroke rate/year from a score of 8  Above score calculated as 1 point each if present [CHF, HTN, DM, Vascular=MI/PAD/Aortic Plaque, Age if 65-74, or Female] Above score calculated as 2 points each if present [Age > 75, or Stroke/TIA/TE]  Maintaining sinus rhythm here. Continue carvedilol plus diltiazem for rate control -> reduced dose to 3.125 mg bid 2/2 low BP & will reduce Diltiazem to  180mg  from 300 mg.  Will hold of on Eliquis x 2 weeks while on DAPT - to allow LRad PSA to heal (per [facility] Sgx Request) --> once ASA stopped, can restart, but would strongly consider reducing to 2.5 mg BID Dosing    Essential hypertension -   low BP limits titration of meds beyond current dosing.     Familial hypercholesteremia / Statin Myopathy Home Repatha & Zetia. ? Convert to Nexlizet on d/c     Lower extremity edema - chronic Lymphedema / Venous Stasis No good options for treatment.  Recommendations has been foot elevation    IDA (iron deficiency anemia) -> notable 2 point drop in hemoglobin from initial post cath, but had previously been 10.6.  No active signs of bleeding.  Had transfusion during last hospitalization bring her hemoglobin up to 10. =>  Will continue to monitor but avoid excess blood draws.  Would like to try to avoid additional transfusion and maybe consider IV iron.  Will discuss with heme-onc.  Continue on thyroid doses for hypothyroidism.      Pseudoaneurysm following procedure (HCC) - L Radial Artery Seen byu Vasc Sgx - plan US Guided compression & hold DOAC - see above.  Barbeau & Reverse Barbeau A.   Right side neck pain is musculoskeletal very tense muscles.  Will apply warm compress and gentle massage. =- feels better today. (Was sleeping with head leaning off to Left - stretching out muscles)  Goals of care, counseling/discussion Plan is to move her around and ambulate today - Txfr orders written last PM Need to prove stable BP levels SaO2 (off of O2) & stability/ walking to be safe for d/c  Anticipate D/c in AM  Vital Signs .    Vitals:   03/11/23 0518 03/11/23 0600 03/11/23 0700 03/11/23 0729  BP: (!) 113/42 (!) 109/47 (!) 103/58 106/63  Pulse: 66 68 72 73  Resp: 17 17 17  (!) 21  Temp:    99.2 F (37.3 C)  TempSrc:    Oral  SpO2: 96% 97% 98% 97%  Weight:   66.9 kg   Height:        Intake/Output Summary (Last 24 hours) at 03/11/2023 0844 Last  data filed at 03/11/2023 0817 Gross per 24 hour  Intake 240 ml  Output 625 ml  Net -385 ml      03/11/2023    7:00 AM 03/09/2023    4:23 AM 03/04/2023    3:55 AM  Last 3 Weights  Weight (lbs) 147 lb 6.4 oz 149 lb 146 lb 9.7 oz  Weight (kg) 66.86 kg 67.586 kg 66.5 kg      Telemetry/ECG    Sinus- Personally Reviewed  Computer read as accelerated junctional rhythm.  Appears to be more sinus rhythm with LVH and repolarization changes.  ST depressions in anterior leads are definitely more pronounced but there are no longer ST elevations in the inferior leads.   CV Studies Summarized.    Recent Admission Cardiac Cath-Coronaries and Grafts 10/2/204: Severe/borderline  occlusive native CAD with LIMA LAD patent extensive disease in SVG-RCA (tandem 70% stenosis along with ectasia, but TIMI-3 flow), large patulous SVG-OM 2 with focal 95% stenosis proximally and post-stenotic tandem aneurysmal segments.  Not favorable for PCI..=> Recommendation was to treat medically can consider adding aspirin to existing Thienopyridine and DOAC.  Not felt to be febrile for PCI.   TTE 03/03/2023: EF 55 to 60%.  No notable RWMA.  GR 2 DD.  Borderline RAP.  Mild biatrial enlargement.  Moderate to severe MAC with mild to moderate MR.  Severely calcified aortic valve with potential paradoxical low-flow/low gradient AS but mean gradient measured 24 mmHg.  Current Admission  Cath-PCI 10/9: Post- Lat STEMI: Stable disease with exception of acute total occlusion of SVG-OM at previous 95% lesion site-DES PCI 3.0 mm x 12 mm Synergy XD DES.  (Plan was to preserve apixaban on 1010, aspirin x 2 weeks and clopidogrel for 6 months but favor longer-term if possible)  TTE 03/09/2023: EF 55 to 60% with anterolateral hypokinesis.  Moderate MR with severe MAC.  Mild to moderate TR.  Calcified aortic valve with measured moderate AAS.  Mildly elevated PAP.  Normal RAP. Right Upper Extremity Ultrasound10/01/2023: Pseudoaneurysm seen at  distal radial artery measuring 0.6 x 0.4 cm with a 0.3 mm neck.   Physical Exam .    GEN: No acute distress.  Chronically ill-appearing elderly woman. Neck: No JVD Cardiac: RRR, Normal S1 & S2.Marland KitchenHarsh 3/6 SEM @ RUSB. No rubs, or gallops.  Respiratory: mildly diminished breath sounds t/o - clears some with cough.  GI: Soft, nontender, non-distended  MS: 3-4 + LLE & 3+ RLE pitting edema with erythema; L radial cath site with firm nodule - sore but not pulsatile.  Barbeaeu & R Barbeau both  normal. Ulnar distribution ecchymoses;    For questions or updates, please contact [facility] Please consult [email or web address] for contact info under        Signed, Bryan Lemma, MD

## 2023-03-11 NOTE — Progress Notes (Addendum)
SATURATION QUALIFICATIONS: (This note is used to comply with regulatory documentation for home oxygen)  Patient Saturations on Room Air at Rest = 87%  Patient Saturations on Room Air while Ambulating = n/a%  Patient Saturations on 2 Liters of oxygen while Ambulating = 96%  Please briefly explain why patient needs home oxygen:  Ethelda Chick BS, ACSM-CEP 03/11/2023 11:30 AM

## 2023-03-11 NOTE — Plan of Care (Signed)

## 2023-03-11 NOTE — Telephone Encounter (Signed)
Pharmacy Patient Advocate Encounter  Received notification from [facility] that Prior Authorization for Nexletol 180MG  tablets  has been APPROVED from 03/11/2023 to 09/09/2023. Ran test claim, Copay is $102.66 due to being in Coverage Gap (donut hole). This test claim was processed through [facility]- copay amounts may vary at other pharmacies due to pharmacy/plan contracts, or as the patient moves through the different stages of their insurance plan.   PA #/Case ID/Reference #: [id number]

## 2023-03-11 NOTE — Progress Notes (Signed)
CARDIAC REHAB PHASE I   PRE:  Rate/Rhythm: 85 SR    BP: sitting 128/52    SpO2: 96 2L  MODE:  Ambulation: 2 laps around room   POST:  Rate/Rhythm: 96 SR    BP: sitting 88/59, recheck after sitting 2 min 128/58     SpO2: 87 RA in recliner, 96 2L  Pt moved out of bed and stood independently. Walked in room with RW, standby assist, 2L. SpO2 96 on 2L therefore tried 1 lap on RA however SpO2 89 walking and 87 RA after sitting. Encouraged IS. BP also low after walk but pt denied sx. Pt preferred to only walk in room. She has a RW and cane at home. Her son lives nearby.  Discussed/reviewed with pt and son MI, Plavix importance, walking as able, NTG, and CRPII. Pt receptive. She is not interested in CRPII but will place referral to [place] to meet protocol.   [id number]  Ethelda Chick BS, ACSM-CEP 03/11/2023 11:23 AM

## 2023-03-11 NOTE — Progress Notes (Addendum)
  Progress Note    03/11/2023 6:39 AM Hospital Day 2  Subjective:  no complaints;  family member states she coughed up a little bit of blood overnight.  afebrile  Vitals:   03/11/23 0500 03/11/23 0518  BP:  (!) 113/42  Pulse: 69 66  Resp: 17 17  Temp:    SpO2: 97% 96%    Physical Exam: General:  no distress Lungs:  non labored Extremities:  palpable left radial pulse; strong hand grip.  Sensory in tact.  Bandage in place.   CBC    Component Value Date/Time   WBC 5.7 03/11/2023 0430   RBC 3.01 (L) 03/11/2023 0430   HGB 8.3 (L) 03/11/2023 0430   HGB 9.0 (L) 03/01/2023 1210   HCT 26.6 (L) 03/11/2023 0430   PLT 258 03/11/2023 0430   PLT 277 03/01/2023 1210   MCV 88.4 03/11/2023 0430   MCH 27.6 03/11/2023 0430   MCHC 31.2 03/11/2023 0430   RDW 16.5 (H) 03/11/2023 0430   LYMPHSABS 0.7 03/09/2023 0441   MONOABS 0.2 03/09/2023 0441   EOSABS 0.1 03/09/2023 0441   BASOSABS 0.0 03/09/2023 0441    BMET    Component Value Date/Time   NA 135 03/11/2023 0430   NA 136 (A) 09/08/2017 0000   K 4.0 03/11/2023 0430   CL 103 03/11/2023 0430   CO2 24 03/11/2023 0430   GLUCOSE 99 03/11/2023 0430   BUN 11 03/11/2023 0430   BUN 8 09/08/2017 0000   CREATININE 0.84 03/11/2023 0430   CREATININE 0.82 03/01/2023 1210   CREATININE 0.69 08/02/2016 1519   CALCIUM 8.1 (L) 03/11/2023 0430   GFRNONAA >60 03/11/2023 0430   GFRNONAA >60 03/01/2023 1210   GFRAA >60 02/05/2020 1045    INR    Component Value Date/Time   INR 1.5 (H) 03/09/2023 0441     Intake/Output Summary (Last 24 hours) at 03/11/2023 9147 Last data filed at 03/10/2023 2300 Gross per 24 hour  Intake 240 ml  Output 550 ml  Net -310 ml     Assessment/Plan:  82 y.o. female with left radial artery psa s/p cardiac catheterization  Hospital Day 2  -pt with palpable left radial pulse with motor and sensory in tact. -family member states she had some hemoptysis-will defer to primary team   Doreatha Massed,  PA-C [facility] [phone] 03/11/2023 6:39 AM  VASCULAR STAFF ADDENDUM: I have independently interviewed and examined the patient. I agree with the above.  Vascular lab not able to do US guided compression at wrist Will hold Eliquis and observe over the weekend. Repeat scan on Monday.   Rande Brunt. Lenell Antu, MD [facility] Phone Number: [phone] 03/11/2023 12:18 PM

## 2023-03-11 NOTE — Telephone Encounter (Signed)
Pharmacy Patient Advocate Encounter   Received notification that prior authorization for Nexletol 180MG  tablets is required/requested.   Insurance verification completed.   The patient is insured through [facility] .   Per test claim: PA required; PA submitted to [facility] via CoverMyMeds Key/confirmation #/EOC [facility] Status is pending

## 2023-03-11 NOTE — TOC Benefit Eligibility Note (Signed)
Patient Product/process development scientist completed.    The patient is insured through [facility]. Patient has Medicare and is not eligible for a copay card, but may be able to apply for patient assistance, if available.    Ran test claim for Nexletol 180 mg and Requires Prior Authorization  Ran test claim for Nexlizet 180-10 mg and Requires Prior Authorization  This test claim was processed through [employer]- copay amounts may vary at other pharmacies due to [employer], or as the patient moves through the different stages of their insurance plan.     Roland Earl, CPHT Pharmacy Technician III Certified Patient Advocate [facility] Patient Advocate Team Direct Number: [phone]  Fax: [phone]

## 2023-03-11 NOTE — Progress Notes (Signed)
Pt transferred from [facility]. Pt [id number], no c/o pain or SOB. Pt on tele NSR. PT given bed side commode and cylinder for colostomy bag. BP 109/63   Pulse 74   Temp 98.1 F (36.7 C) (Oral)   Resp (!) 22   Ht 5\' 1"  (1.549 m)   Wt 66.9 kg   SpO2 95%   BMI 27.85 kg/m  Pt oriented to floor , bed in lowest position, call light near bed. Bed locked and safety socks on. No other needs expressed at this time. Reva Bores 03/11/23 4:26 PM

## 2023-03-12 DIAGNOSIS — I480 Paroxysmal atrial fibrillation: Secondary | ICD-10-CM | POA: Diagnosis not present

## 2023-03-12 DIAGNOSIS — I2121 ST elevation (STEMI) myocardial infarction involving left circumflex coronary artery: Secondary | ICD-10-CM | POA: Diagnosis not present

## 2023-03-12 DIAGNOSIS — I25709 Atherosclerosis of coronary artery bypass graft(s), unspecified, with unspecified angina pectoris: Secondary | ICD-10-CM | POA: Diagnosis not present

## 2023-03-12 DIAGNOSIS — Z789 Other specified health status: Secondary | ICD-10-CM

## 2023-03-12 DIAGNOSIS — I350 Nonrheumatic aortic (valve) stenosis: Secondary | ICD-10-CM | POA: Diagnosis not present

## 2023-03-12 MED ORDER — ALBUTEROL SULFATE (2.5 MG/3ML) 0.083% IN NEBU
2.5000 mg | INHALATION_SOLUTION | Freq: Four times a day (QID) | RESPIRATORY_TRACT | Status: DC | PRN
  Administered 2023-03-12: 2.5 mg via RESPIRATORY_TRACT

## 2023-03-12 NOTE — Progress Notes (Signed)
  Progress Note    03/12/2023 10:36 AM Hospital Day 2  Subjective:  no complaints;  family member concerned about her coughing up a little bit of blood throughout the day  Vitals:   03/12/23 0746 03/12/23 0914  BP: (!) 111/47   Pulse: 63   Resp: 16   Temp: 98.4 F (36.9 C)   SpO2: 100% 99%    Physical Exam: General:  no distress Lungs:  non labored Extremities:  palpable left radial pulse.  Bandage in place.   CBC    Component Value Date/Time   WBC 5.7 03/11/2023 0430   RBC 3.01 (L) 03/11/2023 0430   HGB 8.3 (L) 03/11/2023 0430   HGB 9.0 (L) 03/01/2023 1210   HCT 26.6 (L) 03/11/2023 0430   PLT 258 03/11/2023 0430   PLT 277 03/01/2023 1210   MCV 88.4 03/11/2023 0430   MCH 27.6 03/11/2023 0430   MCHC 31.2 03/11/2023 0430   RDW 16.5 (H) 03/11/2023 0430   LYMPHSABS 0.7 03/09/2023 0441   MONOABS 0.2 03/09/2023 0441   EOSABS 0.1 03/09/2023 0441   BASOSABS 0.0 03/09/2023 0441    BMET    Component Value Date/Time   NA 135 03/11/2023 0430   NA 136 (A) 09/08/2017 0000   K 4.0 03/11/2023 0430   CL 103 03/11/2023 0430   CO2 24 03/11/2023 0430   GLUCOSE 99 03/11/2023 0430   BUN 11 03/11/2023 0430   BUN 8 09/08/2017 0000   CREATININE 0.84 03/11/2023 0430   CREATININE 0.82 03/01/2023 1210   CREATININE 0.69 08/02/2016 1519   CALCIUM 8.1 (L) 03/11/2023 0430   GFRNONAA >60 03/11/2023 0430   GFRNONAA >60 03/01/2023 1210   GFRAA >60 02/05/2020 1045    INR    Component Value Date/Time   INR 1.5 (H) 03/09/2023 0441     Intake/Output Summary (Last 24 hours) at 03/12/2023 1036 Last data filed at 03/11/2023 2011 Gross per 24 hour  Intake --  Output 200 ml  Net -200 ml     Assessment/Plan:  82 y.o. female with left radial artery psa s/p cardiac catheterization  Hospital Day 2  -pt with palpable left radial pulse with motor and sensory in tact. -Plan for repeat duplex on Monday to assess for spontaneous thrombosis -family member states she had some  hemoptysis-will defer to primary team   Daria Pastures MD [facility] Phone Number: [phone] 03/12/2023 10:37 AM

## 2023-03-12 NOTE — Progress Notes (Signed)
Patient Name: Shannon Scott Date of Encounter: 03/12/2023 [facility] Cardiologist: Chrystie Nose, MD    Patient Profile:    Shannon Scott is a 82 y.o. female with hx of 3V CABG LIMA-LAD, SVG-OM, SVG-RCA, severe aortic stenosis, locally advanced endometrial cancer status post chemoradiation, immunotherapy, hysterectomy BSO, colonic mass with partial colectomy and colostomy, prior DVT, A-fib on apixaban, hypertension, hyperlipidemia, statin intolerance, lymphedema, asthma, hypothyroidism was admitted in the morning of 03/09/2023 with an Inferior STEMI -> underwent DES PCI of severe lesion in SVG-OM restoring TIMI-3 flow.  Otherwise stable extensive CAD in both native and grafts.   Interval Summary  .    Denies chest pain. Shortness of breath is improving. No longer on nasal cannula oxygen. Son present at bedside. Patient and son have noticed blood-tinged sputum and nasal excretions-has not been brought to the attention of physician or nursing staff  Assessment & Plan .    Impression: Acute posterior lateral STEMI-SVG to circumflex artery Known history of CAD with prior surgical revascularization without angina pectoris now Left radial artery pseudoaneurysm -postprocedure Chronic HFpEF Hypercholesterolemia. Hypertension. Iron deficiency anemia. Endometrial cancer Paroxysmal atrial fibrillation. Severe aortic stenosis.  Plan: Acute posterior lateral STEMI-SVG to circumflex artery Known history of CAD with prior surgical revascularization without angina pectoris now Status post PCI to SVG/OM graft Currently on dual antiplatelet therapy History of statin intolerance-on Repatha and transitioning from Zetia to Nexlizet  Medical management recommended by interventional cardiology for her CAD No active chest pain. Telemetry independently reviewed, sinus rhythm without ectopy  Chronic HFpEF: Overall euvolemic with the exception of lower extremity swelling  (also has lymphedema per EMR). Currently on Lasix Monday Wednesday and Friday Uptitration of GDMT limited due to soft blood pressures.  Once more euvolemic consider outpatient management/up titration of medical therapy  Left radial artery pseudoaneurysm Vascular team following. Currently not on anticoagulation to minimize risk of bleeding and to promote healing Last dose of Eliquis as per a MAR 03/10/2023 at 9 AM Based on Dr. Elissa Hefty progress note from 03/11/2023 -recommend holding Eliquis for 2 weeks with continued dual antiplatelet therapy in the setting of recent STEMI and PCI intervention.  Reevaluate in 2 weeks and consider anticoagulation and Plavix thereafter. Recommending ultrasound 03/14/2023  Iron deficiency anemia: Hb is Downtrending Overt signs of bleeding with the exception of family/son endorsing blood-tinged sputum/nasal excretions.  No witnessed by nursing staff or MD  Monitor for now Monitor H&H May need to consider IV iron as recommended by Dr. Herbie Baltimore on 03/11/2023  Severe aortic stenosis - (moderate by gradient , but concern for paradoxical low flow/low gradient)  Per Dr. Elissa Hefty note: "(Per Dr. Excell Seltzer - as part of Structural Heart Team: Pt did well with PCI of the SVG-OM today after presenting with STEMI. We had discussed her case yesterday in our valve team meeting. I told her family that she will not be a candidate for TAVR with her MI and high risk of recurrent MI as well as her other comorbidities.) For now we will just simply continue to monitor and treat medically"  Endometrial cancer (HCC) Per Heme Onc    Paroxysmal atrial fibrillation (HCC)-with hypercoagulable state This patients CHA2DS2-VASc Score and unadjusted Ischemic Stroke Rate (% per year) is equal to 10.8 % stroke rate/year from a score of 8 Maintaining sinus rhythm here. Continue carvedilol plus diltiazem for rate control : Tolerating the lower doses of carvedilol and  diltiazem. Anticoagulation held as of 03/10/2023 for reasons mentioned above  Benign essential hypertension: Blood pressures are within acceptable limits. Continue current medical therapy  Hypercholesterolemia/statin intolerance: Currently on Repatha at home. In the process of transitioning from Zetia to Nexlizet  Lower extremity edema/lymphedema/venous stasis: Currently on Lasix as needed Elevate the legs when possible.  Vital Signs .    Vitals:   03/12/23 0307 03/12/23 0746 03/12/23 0914 03/12/23 1257  BP: (!) 121/38 (!) 111/47  (!) 120/50  Pulse: 76 63  60  Resp: 17 16    Temp: 97.7 F (36.5 C) 98.4 F (36.9 C)  98.3 F (36.8 C)  TempSrc: Oral Oral  Oral  SpO2: 97% 100% 99% 96%  Weight:      Height:        Intake/Output Summary (Last 24 hours) at 03/12/2023 1319 Last data filed at 03/11/2023 2011 Gross per 24 hour  Intake --  Output 200 ml  Net -200 ml      03/11/2023    7:00 AM 03/09/2023    4:23 AM 03/04/2023    3:55 AM  Last 3 Weights  Weight (lbs) 147 lb 6.4 oz 149 lb 146 lb 9.7 oz  Weight (kg) 66.86 kg 67.586 kg 66.5 kg      Telemetry/ECG    Sinus rhythm without ectopy-independently reviewed.  No new EKGs.   CV Studies Summarized.    Recent Admission Cardiac Cath-Coronaries and Grafts 10/2/204: Severe/borderline occlusive native CAD with LIMA LAD patent extensive disease in SVG-RCA (tandem 70% stenosis along with ectasia, but TIMI-3 flow), large patulous SVG-OM 2 with focal 95% stenosis proximally and post-stenotic tandem aneurysmal segments.  Not favorable for PCI..=> Recommendation was to treat medically can consider adding aspirin to existing Thienopyridine and DOAC.  Not felt to be febrile for PCI.   TTE 03/03/2023: EF 55 to 60%.  No notable RWMA.  GR 2 DD.  Borderline RAP.  Mild biatrial enlargement.  Moderate to severe MAC with mild to moderate MR.  Severely calcified aortic valve with potential paradoxical low-flow/low gradient AS but mean  gradient measured 24 mmHg.  Current Admission  Cath-PCI 10/9: Post- Lat STEMI: Stable disease with exception of acute total occlusion of SVG-OM at previous 95% lesion site-DES PCI 3.0 mm x 12 mm Synergy XD DES.  (Plan was to preserve apixaban on 1010, aspirin x 2 weeks and clopidogrel for 6 months but favor longer-term if possible)  TTE 03/09/2023: EF 55 to 60% with anterolateral hypokinesis.  Moderate MR with severe MAC.  Mild to moderate TR.  Calcified aortic valve with measured moderate AAS.  Mildly elevated PAP.  Normal RAP. Right Upper Extremity Ultrasound10/01/2023: Pseudoaneurysm seen at distal radial artery measuring 0.6 x 0.4 cm with a 0.3 mm neck.   Physical Exam .    Physical Exam  Constitutional: No distress. She appears chronically ill.  hemodynamically stable  Neck: No JVD present.  Cardiovascular: Normal rate, regular rhythm, S1 normal, S2 normal, intact distal pulses and normal pulses. Exam reveals no gallop, no S3 and no S4.  Murmur heard. Harsh midsystolic murmur is present with a grade of 3/6 at the upper right sternal border radiating to the neck. Pulses:      Carotid pulses are  on the right side with bruit and  on the left side with bruit. Pulmonary/Chest: No stridor. She has no wheezes. She has no rales.  Poor inspiratory effort.[place] present right anterior chest wall  Abdominal: Soft. Bowel sounds are normal. She exhibits no distension. There is no abdominal tenderness.  Musculoskeletal:  General: Edema (Bilateral edema/lymphedema) present.     Cervical back: Neck supple.     Comments: Left radial site currently wrapped.  Ecchymosis present.  Neurological: She is alert and oriented to person, place, and time. She has intact cranial nerves (2-12).  Skin: Skin is warm and moist.   For questions or updates, please contact [facility] Please consult [email or web address] for contact info under        Tessa Lerner, DO, [facility]   [address] [place], [place] [address] Pager: [phone] Office: [phone] 03/12/23 1:22 PM

## 2023-03-12 NOTE — Plan of Care (Signed)
Problem: Education: Goal: Understanding of CV disease, CV risk reduction, and recovery process will improve Outcome: Progressing Goal: Individualized Educational Video(s) Outcome: Progressing   Problem: Activity: Goal: Ability to return to baseline activity level will improve Outcome: Progressing   Problem: Cardiovascular: Goal: Ability to achieve and maintain adequate cardiovascular perfusion will improve Outcome: Progressing Goal: Vascular access site(s) Level 0-1 will be maintained Outcome: Progressing   Problem: Health Behavior/Discharge Planning: Goal: Ability to safely manage health-related needs after discharge will improve Outcome: Progressing   Problem: Education: Goal: Knowledge of General Education information will improve Description: Including pain rating scale, medication(s)/side effects and non-pharmacologic comfort measures Outcome: Progressing   Problem: Health Behavior/Discharge Planning: Goal: Ability to manage health-related needs will improve Outcome: Progressing   Problem: Clinical Measurements: Goal: Ability to maintain clinical measurements within normal limits will improve Outcome: Progressing Goal: Will remain free from infection Outcome: Progressing Goal: Diagnostic test results will improve Outcome: Progressing Goal: Respiratory complications will improve Outcome: Progressing Goal: Cardiovascular complication will be avoided Outcome: Progressing   Problem: Activity: Goal: Risk for activity intolerance will decrease Outcome: Progressing   Problem: Nutrition: Goal: Adequate nutrition will be maintained Outcome: Progressing   Problem: Coping: Goal: Level of anxiety will decrease Outcome: Progressing   Problem: Elimination: Goal: Will not experience complications related to bowel motility Outcome: Progressing Goal: Will not experience complications related to urinary retention Outcome: Progressing   Problem: Pain Managment: Goal:  General experience of comfort will improve Outcome: Progressing   Problem: Skin Integrity: Goal: Risk for impaired skin integrity will decrease Outcome: Progressing

## 2023-03-13 DIAGNOSIS — I480 Paroxysmal atrial fibrillation: Secondary | ICD-10-CM | POA: Diagnosis not present

## 2023-03-13 DIAGNOSIS — I350 Nonrheumatic aortic (valve) stenosis: Secondary | ICD-10-CM | POA: Diagnosis not present

## 2023-03-13 DIAGNOSIS — I25709 Atherosclerosis of coronary artery bypass graft(s), unspecified, with unspecified angina pectoris: Secondary | ICD-10-CM | POA: Diagnosis not present

## 2023-03-13 DIAGNOSIS — I2121 ST elevation (STEMI) myocardial infarction involving left circumflex coronary artery: Secondary | ICD-10-CM | POA: Diagnosis not present

## 2023-03-13 LAB — CBC
HCT: 27.8 % — ABNORMAL LOW (ref 36.0–46.0)
Hemoglobin: 8.5 g/dL — ABNORMAL LOW (ref 12.0–15.0)
MCH: 27 pg (ref 26.0–34.0)
MCHC: 30.6 g/dL (ref 30.0–36.0)
MCV: 88.3 fL (ref 80.0–100.0)
Platelets: 279 10*3/uL (ref 150–400)
RBC: 3.15 MIL/uL — ABNORMAL LOW (ref 3.87–5.11)
RDW: 16.2 % — ABNORMAL HIGH (ref 11.5–15.5)
WBC: 5.4 10*3/uL (ref 4.0–10.5)
nRBC: 0 % (ref 0.0–0.2)

## 2023-03-13 LAB — BASIC METABOLIC PANEL
Anion gap: 8 (ref 5–15)
BUN: 7 mg/dL — ABNORMAL LOW (ref 8–23)
CO2: 27 mmol/L (ref 22–32)
Calcium: 8.5 mg/dL — ABNORMAL LOW (ref 8.9–10.3)
Chloride: 100 mmol/L (ref 98–111)
Creatinine, Ser: 0.79 mg/dL (ref 0.44–1.00)
GFR, Estimated: >60 mL/min (ref >60)
Glucose, Bld: 99 mg/dL (ref 70–99)
Potassium: 3.8 mmol/L (ref 3.5–5.1)
Sodium: 135 mmol/L (ref 135–145)

## 2023-03-13 MED ORDER — HEPARIN SODIUM (PORCINE) 5000 UNIT/ML IJ SOLN
5000.0000 [IU] | Freq: Three times a day (TID) | INTRAMUSCULAR | Status: DC
  Administered 2023-03-13 – 2023-03-14 (×3): 5000 [IU] via SUBCUTANEOUS
  Filled 2023-03-13 (×4): qty 1

## 2023-03-13 MED ORDER — SALINE SPRAY 0.65 % NA SOLN
1.0000 | NASAL | Status: DC | PRN
  Administered 2023-03-13: 1 via NASAL
  Filled 2023-03-13: qty 44

## 2023-03-13 NOTE — Progress Notes (Deleted)
Cardiology Clinic Note   Patient Name: Shannon Scott Date of Encounter: 03/13/2023  Primary Care Provider:  [name], MD Primary Cardiologist:  Chrystie Nose, MD  Patient Profile      Past Medical History    Past Medical History:  Diagnosis Date   A-fib [facility]) 03/26/2020   Acute CVA (cerebrovascular accident) (HCC) 03/26/2020   Arthritis    Asthma    allergy induced   Bilateral carotid artery disease (HCC)    L carotid bruit   Bursitis    left hip   CAD (coronary artery disease)    Cancer (HCC)    Dyslipidemia    intolerant to statins, welchol, niacin, zetia   Goals of care, counseling/discussion 10/11/2017   History of blood transfusion    History of nuclear stress test 04/24/2012   lexiscan; normal study   Hypertension    Hypothyroidism    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 (HCC) 10/11/2017   Postoperative nausea and vomiting 01/02/2016   Past Surgical History:  Procedure Laterality Date   ABDOMINAL HYSTERECTOMY     Carotid Doppler  02/2012   40-59% right int carotid artery stenosis; 60-79% L int carotid stenosis; L carotid bruit   CORONARY ARTERY BYPASS GRAFT  03/12/2004   LIMA to LAD, SVG to circumflex, SVG to PDA   CORONARY/GRAFT ACUTE MI REVASCULARIZATION N/A 03/09/2023   Procedure: Coronary/Graft Acute MI Revascularization;  Surgeon: Tonny Bollman, MD;  Location: [facility] INVASIVE CV LAB;  Service: Cardiovascular;  Laterality: N/A;   CORONARY/GRAFT ANGIOGRAPHY N/A 03/02/2023   Procedure: CORONARY/GRAFT ANGIOGRAPHY;  Surgeon: Yates Decamp, MD;  Location: [facility] INVASIVE CV LAB;  Service: Cardiovascular;  Laterality: N/A;   CYSTOSCOPY W/ URETERAL STENT PLACEMENT Left 12/06/2017   Procedure: CYSTOSCOPY WITH LEFT URETERAL STENT EXCHANGE;  Surgeon: Bjorn Pippin, MD;  Location: [facility];  Service: Urology;  Laterality: Left;   CYSTOSCOPY WITH STENT PLACEMENT Bilateral 08/21/2017   Procedure: CYSTOSCOPY WITH STENT  PLACEMENT;  Surgeon: Rene Paci, MD;  Location: [facility];  Service: Urology;  Laterality: Bilateral;   IR FLUORO GUIDE PORT INSERTION RIGHT  10/11/2017   IR GENERIC HISTORICAL  04/29/2016   IR RADIOLOGIST EVAL & MGMT 04/29/2016 Simonne Come, MD [facility] INTERV RAD   IR GENERIC HISTORICAL  05/12/2016   IR RADIOLOGIST EVAL & MGMT 05/12/2016 Simonne Come, MD [facility] INTERV RAD   IR IVC FILTER PLMT / S&I Lenise Arena GUID/MOD SED  10/11/2017   IR US GUIDE VASC ACCESS RIGHT  10/11/2017   ROBOTIC ASSISTED TOTAL HYSTERECTOMY WITH BILATERAL SALPINGO OOPHERECTOMY Bilateral 12/30/2015   Procedure: XI ROBOTIC ASSISTED TOTAL HYSTERECTOMY WITH BILATERAL SALPINGO OOPHORECTOMY WITH SENTAL LYMPH NODE BIOPSY;  Surgeon: Cleda Mccreedy, MD;  Location: [facility];  Service: Gynecology;  Laterality: Bilateral;   TONSILLECTOMY     TRANSTHORACIC ECHOCARDIOGRAM  04/2007   EF>55%; mild MR; mild-mod TR; mild pulm HTN; mild calcification of aortiv valve leaflets with mild valvular aortic stenosis   TUBAL LIGATION      Allergies  Allergies  Allergen Reactions   Statins Other (See Comments)    Myalgias and memory problems   Ciprofloxacin Itching and Other (See Comments)    Splotchy redness with itching during IV infusion localized to arm. Other reaction(s): itching/swelling/redness at injection site    History of Present Illness    ***  Home Medications    No current facility-administered medications for this visit.   Current Outpatient Medications  Medication Sig Dispense Refill   Bempedoic Acid (  NEXLETOL) 180 MG TABS Take 1 tablet (180 mg total) by mouth daily. 30 tablet 2   Facility-Administered Medications Ordered in Other Visits  Medication Dose Route Frequency Provider Last Rate Last Admin   acetaminophen (TYLENOL) tablet 650 mg  650 mg Oral Q4H PRN Marykay Lex, MD       albuterol (PROVENTIL) (2.5 MG/3ML) 0.083% nebulizer solution 2.5 mg  2.5 mg Nebulization Q6H PRN Chrystie Nose, MD   2.5 mg at  03/12/23 1403   aspirin chewable tablet 81 mg  81 mg Oral Daily Marykay Lex, MD   81 mg at 03/13/23 0940   carvedilol (COREG) tablet 3.125 mg  3.125 mg Oral BID WC Marykay Lex, MD   3.125 mg at 03/13/23 0940   Chlorhexidine Gluconate Cloth 2 % PADS 6 each  6 each Topical Daily Marykay Lex, MD   6 each at 03/13/23 0954   clopidogrel (PLAVIX) tablet 75 mg  75 mg Oral Q breakfast Marykay Lex, MD   75 mg at 03/13/23 0940   diltiazem (CARDIZEM CD) 24 hr capsule 180 mg  180 mg Oral QHS Marykay Lex, MD   180 mg at 03/12/23 2150   ezetimibe (ZETIA) tablet 10 mg  10 mg Oral Daily Marykay Lex, MD   10 mg at 03/13/23 0940   [START ON 03/14/2023] furosemide (LASIX) tablet 20 mg  20 mg Oral Once per day on Monday Wednesday Friday Marykay Lex, MD       heparin injection 5,000 Units  5,000 Units Subcutaneous Q8H Tolia, Sunit, DO       levothyroxine (SYNTHROID) tablet 50 mcg  50 mcg Oral QAC breakfast Marykay Lex, MD   50 mcg at 03/13/23 0940   morphine (PF) 2 MG/ML injection 2 mg  2 mg Intravenous Q1H PRN Marykay Lex, MD       nitroGLYCERIN (NITROSTAT) SL tablet 0.4 mg  0.4 mg Sublingual Q5 min PRN Marykay Lex, MD       ondansetron [facility]) injection 4 mg  4 mg Intravenous Q6H PRN Marykay Lex, MD       Oral care mouth rinse  15 mL Mouth Rinse 4 times per day Marykay Lex, MD   15 mL at 03/11/23 1709   Oral care mouth rinse  15 mL Mouth Rinse PRN Marykay Lex, MD       phenol (CHLORASEPTIC) mouth spray 1 spray  1 spray Mouth/Throat PRN Elmon Kirschner, MD       polyethylene glycol (MIRALAX / GLYCOLAX) packet 17 g  17 g Oral Daily Marykay Lex, MD   17 g at 03/13/23 0940   sodium chloride (OCEAN) 0.65 % nasal spray 1 spray  1 spray Each Nare PRN Chrystie Nose, MD       sodium chloride flush (NS) 0.9 % injection 3 mL  3 mL Intravenous Q12H Marykay Lex, MD   3 mL at 03/13/23 0947   sodium chloride flush (NS) 0.9 % injection 3 mL  3 mL  Intravenous Q12H Marykay Lex, MD   3 mL at 03/13/23 0947   sodium chloride flush (NS) 0.9 % injection 3 mL  3 mL Intravenous PRN Marykay Lex, MD       traMADol Janean Sark) tablet 100 mg  100 mg Oral Q12H Marykay Lex, MD   100 mg at 03/13/23 0940     Family History    Family History  Problem  Relation Age of Onset   Hypertension Mother    Heart disease Mother        Died in her 54s   Stroke Father    Kidney disease Brother    Heart disease Brother        also HTN, hyperlipidemia   Heart attack Brother    Stroke Sister        x2   Hypertension Sister    She indicated that her mother is deceased. She indicated that her father is deceased. She indicated that two of her four sisters are deceased. She indicated that two of her six brothers are deceased. She reported the following about one of her unknown brothers: heart disease, HTN, cholesterol.  Social History    Social History   Socioeconomic History   Marital status: Widowed    Spouse name: Not on file   Number of children: 2   Years of education: Not on file   Highest education level: Not on file  Occupational History    Employer: RETIRED  Tobacco Use   Smoking status: Never   Smokeless tobacco: Never  Vaping Use   Vaping status: Never Used  Substance and Sexual Activity   Alcohol use: No   Drug use: No   Sexual activity: Not Currently  Other Topics Concern   Not on file  Social History Narrative   Not on file   Social Determinants of Health   Financial Resource Strain: Not on file  Food Insecurity: No Food Insecurity (03/01/2023)   Hunger Vital Sign    Worried About Running Out of Food in the Last Year: Never true    Ran Out of Food in the Last Year: Never true  Transportation Needs: No Transportation Needs (03/01/2023)   PRAPARE - Administrator, Civil Service (Medical): No    Lack of Transportation (Non-Medical): No  Physical Activity: Not on file  Stress: Not on file  Social  Connections: Not on file  Intimate Partner Violence: Not At Risk (03/01/2023)   Humiliation, Afraid, Rape, and Kick questionnaire    Fear of Current or Ex-Partner: No    Emotionally Abused: No    Physically Abused: No    Sexually Abused: No     Review of Systems    General:  No chills, fever, night sweats or weight changes.  Cardiovascular:  No chest pain, dyspnea on exertion, edema, orthopnea, palpitations, paroxysmal nocturnal dyspnea. Dermatological: No rash, lesions/masses Respiratory: No cough, dyspnea Urologic: No hematuria, dysuria Abdominal:   No nausea, vomiting, diarrhea, bright red blood per rectum, melena, or hematemesis Neurologic:  No visual changes, wkns, changes in mental status. All other systems reviewed and are otherwise negative except as noted above.       Physical Exam    VS:  There were no vitals taken for this visit. , BMI There is no height or weight on file to calculate BMI.     GEN: Well nourished, well developed, in no acute distress. HEENT: normal. Neck: Supple, no JVD, carotid bruits, or masses. Cardiac: RRR, no murmurs, rubs, or gallops. No clubbing, cyanosis, edema.  Radials/DP/PT 2+ and equal bilaterally.  Respiratory:  Respirations regular and unlabored, clear to auscultation bilaterally. GI: Soft, nontender, nondistended, BS + x 4. MS: no deformity or atrophy. Skin: warm and dry, no rash. Neuro:  Strength and sensation are intact. Psych: Normal affect.      Lab Results  Component Value Date   WBC 5.4 03/13/2023   HGB  8.5 (L) 03/13/2023   HCT 27.8 (L) 03/13/2023   MCV 88.3 03/13/2023   PLT 279 03/13/2023   Lab Results  Component Value Date   CREATININE 0.79 03/13/2023   BUN 7 (L) 03/13/2023   NA 135 03/13/2023   K 3.8 03/13/2023   CL 100 03/13/2023   CO2 27 03/13/2023   Lab Results  Component Value Date   ALT 8 03/09/2023   AST 18 03/09/2023   ALKPHOS 74 03/09/2023   BILITOT 0.5 03/09/2023   Lab Results  Component Value  Date   CHOL 173 03/09/2023   HDL 48 03/09/2023   LDLCALC 111 (H) 03/09/2023   TRIG 70 03/09/2023   CHOLHDL 3.6 03/09/2023    Lab Results  Component Value Date   HGBA1C 5.3 03/09/2023     Review of Prior Studies      Assessment & Plan   1.  ***     {Are you ordering a CV Procedure (e.g. stress test, cath, DCCV, TEE, etc)?   Press F2        :[id number]}   Signed, Bettey Mare. Liborio Nixon, ANP, AACC   03/13/2023 2:01 PM      Office [phone] Fax [phone]  Notice: This dictation was prepared with Dragon dictation along with smaller phrase technology. Any transcriptional errors that result from this process are unintentional and may not be corrected upon review.

## 2023-03-13 NOTE — Progress Notes (Signed)
Patient Name: Shannon Scott Date of Encounter: 03/13/2023 [facility] Cardiologist: Chrystie Nose, MD    Patient Profile:    Shannon Scott is a 82 y.o. female with hx of 3V CABG LIMA-LAD, SVG-OM, SVG-RCA, severe aortic stenosis, locally advanced endometrial cancer status post chemoradiation, immunotherapy, hysterectomy BSO, colonic mass with partial colectomy and colostomy, prior DVT, A-fib on apixaban, hypertension, hyperlipidemia, statin intolerance, lymphedema, asthma, hypothyroidism was admitted in the morning of 03/09/2023 with an Inferior STEMI -> underwent DES PCI of severe lesion in SVG-OM restoring TIMI-3 flow.  Otherwise stable extensive CAD in both native and grafts.   Interval Summary  .    Denies anginal chest pain. Shortness of breath is improving. On minimal nasal cannula oxygen. Accompanied by daughter and son at bedside. Wishes to ambulate on the floors.  Assessment & Plan .    Impression: Acute posterior lateral STEMI-SVG to circumflex artery Known history of CAD with prior surgical revascularization without angina pectoris now Left radial artery pseudoaneurysm -postprocedure Chronic HFpEF Hypercholesterolemia. Hypertension. Iron deficiency anemia. Endometrial cancer Paroxysmal atrial fibrillation. Severe aortic stenosis.  Plan: Acute posterior lateral STEMI-SVG to circumflex artery Known history of CAD with prior surgical revascularization without angina pectoris now Status post PCI to SVG/OM graft Currently on dual antiplatelet therapy History of statin intolerance-on Repatha and transitioning from Zetia to Nexlizet  Medical management recommended by interventional cardiology for her CAD No active chest pain. Telemetry independently reviewed, sinus rhythm without ectopy  Chronic HFpEF: Overall euvolemic with the exception of lower extremity swelling (also has lymphedema per EMR). Currently on Lasix Monday Wednesday and  Friday Uptitration of GDMT limited due to soft blood pressures.  Continue current medical therapy-titration as outpatient.  Paroxysmal atrial fibrillation (HCC)-with hypercoagulable state This patients CHA2DS2-VASc Score and unadjusted Ischemic Stroke Rate (% per year) is equal to 10.8 % stroke rate/year from a score of 8 Maintaining sinus rhythm here. Continue carvedilol plus diltiazem for rate control : Tolerating the lower doses of carvedilol and diltiazem. Anticoagulation held as of 03/10/2023 for reasons mentioned above. Will start subcu heparin for DVT prophylaxis. Per nursing staff SCDs have been off.  Left radial artery pseudoaneurysm Vascular team following. Currently not on anticoagulation to minimize risk of bleeding and to promote healing Last dose of Eliquis as per a MAR 03/10/2023 at 9 AM Based on Dr. Elissa Hefty progress note from 03/11/2023 -recommend holding Eliquis for 2 weeks with continued dual antiplatelet therapy in the setting of recent STEMI and PCI intervention.  Reevaluate in 2 weeks and consider anticoagulation and Plavix thereafter. Vascular team recommending ultrasound 03/14/2023. Based on the results of the vascular ultrasound antiplatelets/anticoagulation recommendations will need to be finalized prior to discharge.  Iron deficiency anemia: Hb is relatively stable Overt signs of bleeding with the exception of family/son endorsing blood-tinged sputum/nasal excretions.  No witnessed by nursing staff or MD  Monitor for now Monitor H&H  Severe aortic stenosis - (moderate by gradient , but concern for paradoxical low flow/low gradient)  Per Dr. Elissa Hefty note 03/11/2023: "(Per Dr. Excell Seltzer - as part of Structural Heart Team: Pt did well with PCI of the SVG-OM today after presenting with STEMI. We had discussed her case yesterday in our valve team meeting. I told her family that she will not be a candidate for TAVR with her MI and high risk of recurrent MI as well as  her other comorbidities.) For now we will just simply continue to monitor and treat medically"  Endometrial cancer (  HCC) Per Heme Onc    Benign essential hypertension: Blood pressures are within acceptable limits. Continue current medical therapy  Hypercholesterolemia/statin intolerance: Currently on Repatha at home. In the process of transitioning from Zetia to Nexlizet  Lower extremity edema/lymphedema/venous stasis: Currently on Lasix  Elevate the legs when possible.  Patient wishes to ambulate.  PT consult requested.  Patient also has an ostomy on the anterior abdominal wall.  Per nursing staff she has her supplies.  Will hold off on wound care consult for ostomy management.  Spoke to Vascular surgery (Dr. Hetty Blend) and will resume subcutaneous heparin for DVT prophylaxis.    Vital Signs .    Vitals:   03/13/23 0423 03/13/23 0723 03/13/23 0954 03/13/23 1137  BP: (!) 112/92 (!) 114/47  (!) 129/56  Pulse: (!) 59 (!) 54  62  Resp: 16   16  Temp: 97.9 F (36.6 C)   98.8 F (37.1 C)  TempSrc: Oral   Oral  SpO2: 99% 98% 97% 96%  Weight:      Height:       No intake or output data in the 24 hours ending 03/13/23 1157     03/11/2023    7:00 AM 03/09/2023    4:23 AM 03/04/2023    3:55 AM  Last 3 Weights  Weight (lbs) 147 lb 6.4 oz 149 lb 146 lb 9.7 oz  Weight (kg) 66.86 kg 67.586 kg 66.5 kg      Telemetry/ECG    Sinus rhythm without ectopy-independently reviewed.  No new EKGs.   CV Studies Summarized.    Recent Admission Cardiac Cath-Coronaries and Grafts 10/2/204: Severe/borderline occlusive native CAD with LIMA LAD patent extensive disease in SVG-RCA (tandem 70% stenosis along with ectasia, but TIMI-3 flow), large patulous SVG-OM 2 with focal 95% stenosis proximally and post-stenotic tandem aneurysmal segments.  Not favorable for PCI..=> Recommendation was to treat medically can consider adding aspirin to existing Thienopyridine and DOAC.  Not felt to be  febrile for PCI.   TTE 03/03/2023: EF 55 to 60%.  No notable RWMA.  GR 2 DD.  Borderline RAP.  Mild biatrial enlargement.  Moderate to severe MAC with mild to moderate MR.  Severely calcified aortic valve with potential paradoxical low-flow/low gradient AS but mean gradient measured 24 mmHg.  Current Admission  Cath-PCI 10/9: Post- Lat STEMI: Stable disease with exception of acute total occlusion of SVG-OM at previous 95% lesion site-DES PCI 3.0 mm x 12 mm Synergy XD DES.  (Plan was to preserve apixaban on 1010, aspirin x 2 weeks and clopidogrel for 6 months but favor longer-term if possible)  TTE 03/09/2023: EF 55 to 60% with anterolateral hypokinesis.  Moderate MR with severe MAC.  Mild to moderate TR.  Calcified aortic valve with measured moderate AAS.  Mildly elevated PAP.  Normal RAP. Right Upper Extremity Ultrasound10/01/2023: Pseudoaneurysm seen at distal radial artery measuring 0.6 x 0.4 cm with a 0.3 mm neck.   Physical Exam .    Physical Exam  Constitutional: No distress. She appears chronically ill.  hemodynamically stable  Neck: No JVD present.  Cardiovascular: Normal rate, regular rhythm, S1 normal, S2 normal, intact distal pulses and normal pulses. Exam reveals no gallop, no S3 and no S4.  Murmur heard. Harsh midsystolic murmur is present with a grade of 3/6 at the upper right sternal border radiating to the neck. Pulses:      Carotid pulses are  on the right side with bruit and  on the left side with  bruit. Pulmonary/Chest: No stridor. She has no wheezes. She has no rales.  Poor inspiratory effort.[place] present right anterior chest wall  Abdominal: Soft. She exhibits no distension. There is no abdominal tenderness.  Ostomy bag noted, no signs of infection.   Musculoskeletal:        General: Edema (Bilateral edema/lymphedema) present.     Cervical back: Neck supple.     Comments: Left radial site currently wrapped.  Ecchymosis present.  Neurological: She is alert and  oriented to person, place, and time. She has intact cranial nerves (2-12).  Skin: Skin is warm and moist.   For questions or updates, please contact [facility] Please consult [email or web address] for contact info under      Tessa Lerner, DO, [facility]  [facility]  [address] [place], [place] [address] Pager: [phone] Office: [phone] 03/13/23 11:57 AM

## 2023-03-13 NOTE — Evaluation (Signed)
Physical Therapy Evaluation Patient Details Name: Shannon Scott MRN: [id number] DOB: April 07, 1941 Today's Date: 03/13/2023  History of Present Illness  Pt is 82 yo presenting to [facility] on 10/9 with inferior STEMI underwent DES PCI of severe lesion in SVG-OM restoring flow. PMH: 3V CABG, SVG-OM, SVG- RCA, severe aortic stenosis, locally advanced endometrial cx s/p chemoradiation, immunotherapy, hysterectomy BSO, colonic mass with partial colectomy and colostomy, prior DVT, a-fib, HTN, hyperlipidemia, lymphedema, asthma, hypothyroidism.  Clinical Impression  Pt is presenting slightly below baseline level of functioning. Prior to hospitalization pt was Mod I with all activities intermittently using SPC for mobility. Currently pt required multiple standing rest breaks with 200 ft of gait due to decreased activity tolerance with supervision for sit to stand and gait. Pt son and daughter check on her in am/pm and pt is alone at home the rest of the day. Due to pt current functional status, home set up and available assistance recommending rollator and skilled physical therapy services 3x/weekly on discharge from acute care hospital setting in order to work on activity tolerance, strength, balance to decrease risk for falls, injury, immobility and re-hospitalization. Pt tolerated treatment session well.         If plan is discharge home, recommend the following: Help with stairs or ramp for entrance;Assistance with cooking/housework;Assist for transportation     Equipment Recommendations Rollator (4 wheels)     Functional Status Assessment Patient has had a recent decline in their functional status and demonstrates the ability to make significant improvements in function in a reasonable and predictable amount of time.     Precautions / Restrictions Precautions Precautions: Fall Restrictions Weight Bearing Restrictions: No      Mobility  Bed Mobility Overal bed mobility: Modified Independent    General bed mobility comments: increased time to get LE to EOB and scoot to EOB. No physical assist required, no use of rails.    Transfers Overall transfer level: Needs assistance Equipment used: Rollator (4 wheels) Transfers: Sit to/from Stand Sit to Stand: Supervision           General transfer comment: supervision with sit to stand for new device. Pt has not used a rollator before; education on locking brakes and hand placement.    Ambulation/Gait Ambulation/Gait assistance: Supervision Gait Distance (Feet): 200 Feet Assistive device: Rollator (4 wheels) Gait Pattern/deviations: Step-through pattern, Decreased stride length Gait velocity: decreased Gait velocity interpretation: 1.31 - 2.62 ft/sec, indicative of limited community ambulator   General Gait Details: mild frontal plane sway, short steps, intermittent standing rest breaks due to shortness of breathe with rollator.      Balance Overall balance assessment: Mild deficits observed, not formally tested         Pertinent Vitals/Pain Pain Assessment Pain Assessment: Faces Faces Pain Scale: Hurts a little bit Pain Location: R shoulder Pain Descriptors / Indicators: Aching Pain Intervention(s): Limited activity within patient's tolerance, Monitored during session    Home Living Family/patient expects to be discharged to:: Private residence Living Arrangements: Alone Available Help at Discharge: Family;Available 24 hours/day Type of Home: House Home Access: Stairs to enter Entrance Stairs-Rails: None Entrance Stairs-Number of Steps: 2   Home Layout: One level Home Equipment: Cane - single Librarian, academic (2 wheels);Shower seat      Prior Function Prior Level of Function : Independent/Modified Independent;Needs assist       Mobility Comments: pt reports she holds on to something at all times but she only uses SPC intermittently ADLs Comments: Reports Mod I.  Son assists with finances and  daughter/son help with medication     Extremity/Trunk Assessment   Upper Extremity Assessment Upper Extremity Assessment: Generalized weakness    Lower Extremity Assessment Lower Extremity Assessment: Generalized weakness    Cervical / Trunk Assessment Cervical / Trunk Assessment: Normal  Communication   Communication Communication: No apparent difficulties  Cognition Arousal: Alert Behavior During Therapy: WFL for tasks assessed/performed Overall Cognitive Status: Within Functional Limits for tasks assessed        General Comments General comments (skin integrity, edema, etc.): Pt is not on O2 at home, stayed on 1L O2 during functional activity to decrease cardiac load. Pt HR remained upper 70's/lower 80's throughout session.        Assessment/Plan    PT Assessment Patient needs continued PT services  PT Problem List Decreased mobility;Decreased strength;Decreased activity tolerance       PT Treatment Interventions DME instruction;Therapeutic exercise;Gait training;Balance training;Stair training;Functional mobility training;Therapeutic activities;Patient/family education    PT Goals (Current goals can be found in the Care Plan section)  Acute Rehab PT Goals Patient Stated Goal: Return home PT Goal Formulation: With patient/family Time For Goal Achievement: 03/27/23 Potential to Achieve Goals: Good    Frequency Min 1X/week        AM-PAC PT "6 Clicks" Mobility  Outcome Measure Help needed turning from your back to your side while in a flat bed without using bedrails?: None Help needed moving from lying on your back to sitting on the side of a flat bed without using bedrails?: None Help needed moving to and from a bed to a chair (including a wheelchair)?: A Little Help needed standing up from a chair using your arms (e.g., wheelchair or bedside chair)?: A Little Help needed to walk in hospital room?: A Little Help needed climbing 3-5 steps with a railing? : A  Little 6 Click Score: 20    End of Session   Activity Tolerance: Patient tolerated treatment well Patient left: with family/visitor present;Other (comment) (call bell and phone on pt bed. Pt in bathroom with daughter changing ostomy bag.) Nurse Communication: Mobility status PT Visit Diagnosis: Other abnormalities of gait and mobility (R26.89)    Time: 1610-9604 PT Time Calculation (min) (ACUTE ONLY): 29 min   Charges:   PT Evaluation $PT Eval Low Complexity: 1 Low PT Treatments $Therapeutic Activity: 8-22 mins PT General Charges $$ ACUTE PT VISIT: 1 Visit         Harrel Carina, DPT, CLT  Acute Rehabilitation Services Office: [phone] (Secure chat preferred)   Claudia Desanctis 03/13/2023, 4:35 PM

## 2023-03-13 NOTE — Progress Notes (Signed)
Staff offered to walk with pt in the halls multiple times yesterday and today, however, pt has declined every time stating she will do it later. She has walked to the bathroom multiple times over the past 2 days.

## 2023-03-14 ENCOUNTER — Inpatient Hospital Stay (HOSPITAL_COMMUNITY): Payer: Medicare Other

## 2023-03-14 DIAGNOSIS — I2121 ST elevation (STEMI) myocardial infarction involving left circumflex coronary artery: Secondary | ICD-10-CM | POA: Diagnosis not present

## 2023-03-14 DIAGNOSIS — I721 Aneurysm of artery of upper extremity: Secondary | ICD-10-CM

## 2023-03-14 DIAGNOSIS — I25709 Atherosclerosis of coronary artery bypass graft(s), unspecified, with unspecified angina pectoris: Secondary | ICD-10-CM | POA: Diagnosis not present

## 2023-03-14 DIAGNOSIS — I10 Essential (primary) hypertension: Secondary | ICD-10-CM | POA: Diagnosis not present

## 2023-03-14 DIAGNOSIS — R600 Localized edema: Secondary | ICD-10-CM | POA: Diagnosis not present

## 2023-03-14 LAB — CBC
HCT: 28 % — ABNORMAL LOW (ref 36.0–46.0)
Hemoglobin: 8.7 g/dL — ABNORMAL LOW (ref 12.0–15.0)
MCH: 27.2 pg (ref 26.0–34.0)
MCHC: 31.1 g/dL (ref 30.0–36.0)
MCV: 87.5 fL (ref 80.0–100.0)
Platelets: 297 10*3/uL (ref 150–400)
RBC: 3.2 MIL/uL — ABNORMAL LOW (ref 3.87–5.11)
RDW: 15.9 % — ABNORMAL HIGH (ref 11.5–15.5)
WBC: 4.4 10*3/uL (ref 4.0–10.5)
nRBC: 0 % (ref 0.0–0.2)

## 2023-03-14 LAB — BASIC METABOLIC PANEL
Anion gap: 8 (ref 5–15)
BUN: 8 mg/dL (ref 8–23)
CO2: 26 mmol/L (ref 22–32)
Calcium: 8.7 mg/dL — ABNORMAL LOW (ref 8.9–10.3)
Chloride: 103 mmol/L (ref 98–111)
Creatinine, Ser: 0.85 mg/dL (ref 0.44–1.00)
GFR, Estimated: >60 mL/min (ref >60)
Glucose, Bld: 95 mg/dL (ref 70–99)
Potassium: 3.8 mmol/L (ref 3.5–5.1)
Sodium: 137 mmol/L (ref 135–145)

## 2023-03-14 NOTE — Progress Notes (Signed)
VASCULAR LAB    Right upper extremity arterial duplex has been performed.  See CV proc for preliminary results.   Benard Minturn, RVT 03/14/2023, 9:58 AM

## 2023-03-14 NOTE — Progress Notes (Signed)
RN called to room d/t pt stating she was bleeding. Pt noted to have blood on her blankets and gown. Also noted to have bleeding under skin on left abdomen. Pt stated that was the location of her last SQ heparin administration around 0500 this morning. Abdomen is soft under the majority of the discolored area, but a small area of firmness is noted. Pt c/o tenderness in the firm area as well. Current vitals= BP (!) 119/53   Pulse 67   Temp 98.6 F (37 C)   Resp 18   Ht 5\' 1"  (1.549 m)   Wt 66.9 kg   SpO2 94%   BMI 27.85 kg/m  Micah Flesher paged

## 2023-03-14 NOTE — Progress Notes (Signed)
Duplex shows thrombosis of small pseudoaneurysm. Patient may resume anticoagulation as directed by cardiology. Please call for questions.  Rande Brunt. Lenell Antu, MD [facility] Phone Number: [phone] 03/14/2023 10:51 AM

## 2023-03-14 NOTE — Plan of Care (Signed)
Problem: Education: Goal: Understanding of CV disease, CV risk reduction, and recovery process will improve Outcome: Progressing Goal: Individualized Educational Video(s) Outcome: Progressing   Problem: Activity: Goal: Ability to return to baseline activity level will improve Outcome: Progressing   Problem: Cardiovascular: Goal: Ability to achieve and maintain adequate cardiovascular perfusion will improve Outcome: Progressing Goal: Vascular access site(s) Level 0-1 will be maintained Outcome: Progressing   Problem: Health Behavior/Discharge Planning: Goal: Ability to safely manage health-related needs after discharge will improve Outcome: Progressing   Problem: Education: Goal: Knowledge of General Education information will improve Description: Including pain rating scale, medication(s)/side effects and non-pharmacologic comfort measures Outcome: Progressing   Problem: Health Behavior/Discharge Planning: Goal: Ability to manage health-related needs will improve Outcome: Progressing   Problem: Clinical Measurements: Goal: Ability to maintain clinical measurements within normal limits will improve Outcome: Progressing Goal: Will remain free from infection Outcome: Progressing Goal: Diagnostic test results will improve Outcome: Progressing Goal: Respiratory complications will improve Outcome: Progressing Goal: Cardiovascular complication will be avoided Outcome: Progressing

## 2023-03-14 NOTE — Progress Notes (Signed)
Mobility Specialist Progress Note:    03/14/23 1400  Mobility  Activity Refused mobility  Mobility Specialist Start Time (ACUTE ONLY) 1352   Pt refused mobility, no reason specified. Will f/u as able.    D'Vante Earlene Plater Mobility Specialist Please contact via Special educational needs teacher or Rehab office at [phone]

## 2023-03-14 NOTE — TOC Progression Note (Signed)
Transition of Care [facility]) - Progression Note    Patient Details  Name: Shannon Scott MRN: [id number] Date of Birth: 1941/04/24  [facility] CM/SW Contact  Graves-Bigelow, Lamar Laundry, RN Phone Number: 03/14/2023, 3:13 PM  Clinical Narrative:  Case Manager spoke with patient and she is agreeable to a rolling walker with seat via [employer]. Orders placed and DME to be delivered to the room before transition home. Case Manager dicussed home health PT and the patient wants to think about services. Patient has used [facility] in the past for services. Case Manager will follow up tomorrow with patient and family.   Expected Discharge Plan: Home w Home Health Services Barriers to Discharge: No Barriers Identified  Expected Discharge Plan and Services   Discharge Planning Services: NA Post Acute Care Choice: NA Living arrangements for the past 2 months: Single Family Home                 DME Arranged: Walker rolling with seat DME Agency: [employer] Date DME Agency Contacted: 03/14/23 Time DME Agency Contacted: [phone] Representative spoke with at DME Agency: Vaughan Basta.  Social Determinants of Health (SDOH) Interventions SDOH Screenings   Food Insecurity: No Food Insecurity (03/01/2023)  Housing: Low Risk  (03/01/2023)  Transportation Needs: No Transportation Needs (03/01/2023)  Utilities: Not At Risk (03/01/2023)  Tobacco Use: Low Risk  (03/09/2023)   Readmission Risk Interventions     No data to display

## 2023-03-14 NOTE — Progress Notes (Addendum)
Rounding Note    Patient Name: Shannon Scott Date of Encounter: 03/14/2023  [facility] Cardiologist: Chrystie Nose, MD   Subjective   No chest pain, extensive bruising on lower abdomen after heparin injection. Left wrist appears stable, right groin stable without hematoma.   Inpatient Medications    Scheduled Meds:  aspirin  81 mg Oral Daily   carvedilol  3.125 mg Oral BID WC   Chlorhexidine Gluconate Cloth  6 each Topical Daily   clopidogrel  75 mg Oral Q breakfast   diltiazem  180 mg Oral QHS   ezetimibe  10 mg Oral Daily   furosemide  20 mg Oral Once per day on Monday Wednesday Friday   heparin  5,000 Units Subcutaneous Q8H   levothyroxine  50 mcg Oral QAC breakfast   mouth rinse  15 mL Mouth Rinse 4 times per day   polyethylene glycol  17 g Oral Daily   traMADol  100 mg Oral Q12H   Continuous Infusions:  PRN Meds: acetaminophen, albuterol, morphine injection, nitroGLYCERIN, ondansetron (ZOFRAN) IV, mouth rinse, phenol, sodium chloride, sodium chloride flush   Vital Signs    Vitals:   03/13/23 1240 03/13/23 1948 03/14/23 0447 03/14/23 0748  BP: (!) 126/59 (!) 144/62 (!) 140/49 (!) 119/53  Pulse: 63 77 65 67  Resp:  18 18   Temp:  98.3 F (36.8 C) 97.6 F (36.4 C) 98.6 F (37 C)  TempSrc:  Oral Oral Oral  SpO2: 94% 92% 96% 94%  Weight:      Height:       No intake or output data in the 24 hours ending 03/14/23 1120    03/11/2023    7:00 AM 03/09/2023    4:23 AM 03/04/2023    3:55 AM  Last 3 Weights  Weight (lbs) 147 lb 6.4 oz 149 lb 146 lb 9.7 oz  Weight (kg) 66.86 kg 67.586 kg 66.5 kg      Telemetry    Sinus rhythm in the 60s - Personally Reviewed  ECG    No new tracings - Personally Reviewed  Physical Exam   GEN: No acute distress.   Neck: No JVD Cardiac: RRR, 5/6 systolic murmur Respiratory: crackles on right GI: Soft, nontender, non-distended - colostomy in place, bruising on lower left abdomen MS: legs wrapped, L >  R Neuro:  Nonfocal  Psych: Normal affect   Labs    High Sensitivity Troponin:   Recent Labs  Lab 03/01/23 1428 03/01/23 1703 03/02/23 0023 03/09/23 0441 03/09/23 0738  TROPONINIHS 509* 519* 628* 238* 2,626*     Chemistry Recent Labs  Lab 03/09/23 0441 03/09/23 0543 03/11/23 0430 03/13/23 0500 03/14/23 0508  NA 136   < > 135 135 137  K 4.1   < > 4.0 3.8 3.8  CL 103   < > 103 100 103  CO2 23   < > 24 27 26   GLUCOSE 138*   < > 99 99 95  BUN 16   < > 11 7* 8  CREATININE 0.89   < > 0.84 0.79 0.85  CALCIUM 9.2   < > 8.1* 8.5* 8.7*  MG  --   --  1.8  --   --   PROT 6.5  --   --   --   --   ALBUMIN 3.5  --   --   --   --   AST 18  --   --   --   --  ALT 8  --   --   --   --   ALKPHOS 74  --   --   --   --   BILITOT 0.5  --   --   --   --   GFRNONAA >60   < > >60 >60 >60  ANIONGAP 10   < > 8 8 8    < > = values in this interval not displayed.    Lipids  Recent Labs  Lab 03/09/23 0441  CHOL 173  TRIG 70  HDL 48  LDLCALC 111*  CHOLHDL 3.6    Hematology Recent Labs  Lab 03/10/23 0246 03/11/23 0430 03/13/23 0500  WBC 7.2 5.7 5.4  RBC 3.38* 3.01* 3.15*  HGB 9.3* 8.3* 8.5*  HCT 29.2* 26.6* 27.8*  MCV 86.4 88.4 88.3  MCH 27.5 27.6 27.0  MCHC 31.8 31.2 30.6  RDW 16.3* 16.5* 16.2*  PLT 286 258 279   Thyroid No results for input(s): "TSH", "FREET4" in the last 168 hours.  BNPNo results for input(s): "BNP", "PROBNP" in the last 168 hours.  DDimer No results for input(s): "DDIMER" in the last 168 hours.   Radiology    VAS [place] UPPER EXTREMITY ARTERIAL DUPLEX  Result Date: 03/14/2023  UPPER EXTREMITY DUPLEX STUDY Patient Name:  Shannon Scott  Date of Exam:   03/14/2023 Medical Rec #: [id number]          Accession #:    [id number] Date of Birth: 01-21-1941           Patient Gender: F Patient Age:   82 years Exam Location:  [facility] Procedure:      VAS [place] UPPER EXTREMITY ARTERIAL DUPLEX Referring Phys: [facility]  --------------------------------------------------------------------------------  Indications: Follow up Left radial artery pseudoaneurysm duplex to assess for              spontaneous thrombosis. History:     Patient has a history of catheterization via left radial artery.  Risk Factors: Hypertension, hyperlipidemia, prior MI, coronary artery disease. Comparison Study: Prior study done 03/09/23 Performing Technologist: Sherren Kerns RVS  Examination Guidelines: A complete evaluation includes B-mode imaging, spectral Doppler, color Doppler, and power Doppler as needed of all accessible portions of each vessel. Bilateral testing is considered an integral part of a complete examination. Limited examinations for reoccurring indications may be performed as noted.  Left Doppler Findings: +-------------+----------+---------+--------+--------+ Site         PSV (cm/s)Waveform StenosisComments +-------------+----------+---------+--------+--------+ Brachial Dist          triphasic                 +-------------+----------+---------+--------+--------+ Radial Prox            triphasic                 +-------------+----------+---------+--------+--------+ Radial Mid             triphasic                 +-------------+----------+---------+--------+--------+ Ulnar Prox             triphasic                 +-------------+----------+---------+--------+--------+   Summary:  Left: 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 Small hematoma remains measuring       0.47 cm X 0.32 cm. *See table(s) above for measurements and observations. Electronically signed by Gerarda Fraction on 03/14/2023 at  10:57:06 AM.    Final     Cardiac Studies   LHC 03/09/23: 1.  Known severe native vessel coronary artery disease, native coronary arteries not selectively imaged this procedure 2.  Known patency of the LIMA to LAD, this graft not selectively imaged this procedure 3.   Severe diffuse SVG to RCA disease, medical therapy recommended 4.  Acute total occlusion of a severely diseased saphenous vein graft to OM, treated successfully with primary PCI using a 3.0 x 12 mm Synergy DES   Recommendations: Okay to resume apixaban tomorrow morning, would use aspirin 81 mg daily x 2 weeks and this patient at high bleeding risk, clopidogrel 75 mg daily x 6 months minimum favor long-term if tolerated (loaded with 600 mg in the emergency room)  Patient Profile     82 y.o. female with hx of 3V CABG LIMA-LAD, SVG-OM, SVG-RCA, severe aortic stenosis, locally advanced endometrial cancer status post chemoradiation, immunotherapy, hysterectomy BSO, colonic mass with partial colectomy and colostomy, prior DVT, A-fib on apixaban, hypertension, hyperlipidemia, statin intolerance, lymphedema, asthma, hypothyroidism was admitted in the morning of 03/09/2023 with an Inferior STEMI -> underwent DES PCI of severe lesion in SVG-OM restoring TIMI-3 flow. Otherwise stable extensive CAD in both native and grafts.   Followed by the TAVR team. Given recurrent STEMI, she is not a candidate for TAVR. Structural heart team ha  Assessment & Plan    CAD Inferior STEMI - she was taken emergently to the cath lab 03/09/23 with PCI/DES to SVG-OM  - otherwise stable extensive disease including tandem lesions in the SVG-dRCA of 70% followed by 70% - no chest pain today - plan medical therapy for remaining disease   Antiplatelet / anticoagulation regimen - initial plan was for plavix and eliquis - however, with development of left radial PSA, eliquis was stopped and ASA started in conjunction with plavix for DES - plan for plavix, ASA x 2 weeks hold eliquis, then restart eliquis after 2 weeks after ASA therapy - ultimately on plavix and eliquis   Left radial PSA - VVS consulted - repeat US today showed spontaneous thrombosis of PSA   Hyperlipidemia with LDL goal < 55 - consider lower goal given  progression of graft disease - recommendations to continue repatha and switch zetia to nexlizet while she waits to get into lipid clinic for inclisiren  - nexlizet has a high copay, we will try to get a prior authorization   Chronic diastolic heart failure - Echo this admission with preserved EF - crackles in right base - LE edema related to lymphedema - coreg 3.125 mg BID, 20 mg lasix MWF - received lasix today   Chronic respiratory failure - remains on O2 - need walking saturations to see if she needs home O2 - after lasix today will attempt to wean   LLQ bruising - will get CBC today to make sure Hb stable - suspect bruising today from heparin shot   Severe aortic stenosis - was being considered for TAVR, but no longer a candidate with acute STEMI - structural hear team has spoken with her - lasix MWF as above, may need PRN lasix instructions at discharge   PAF Chronic anticoagulation - holding eliquis for 2 weeks as above - maintaining sinus rhythm here - no on 180 mg cardizem, was on 300 mg at home - HR dropped into the 50s on the 12th and cardizem and coreg were both reduced   Ambulate today, get O2 sats, wean O2 as tolerated.  Plan discharge tomorrow.   For questions or updates, please contact [facility] Please consult [email or web address] for contact info under        Signed, Marcelino Duster, PA  03/14/2023, 11:20 AM     Patient seen and examined. Agree with assessment and plan.  No recurrent anginal symptomatology.  Extensive lower abdominal ecchymosis and bruising following heparin injections.  Left radial cath site appears stable.  She is status post emergent PCI/DES stenting to SVG to OM in the setting of inferior STEMI on March 09, 2023.  She has tandem lesions and SVG supplying her RCA of at least 70%.  She is not having recurrent anginal symptomatology.  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  She is on  aspirin/Plavix presently with plans to resume Eliquis in 2 weeks with planned discontinuance of aspirin at that time.  She has 3/6 murmur consistent with aortic valve stenosis for which she had been under consideration for TAVR prior to her recent ACS.  She has lymphedema with leg swelling.  Will give Lasix 20 mg today.  Currently maintaining sinus rhythm on reduced dose Cardizem now at 180 compared to 300 at home due to recent bradycardia.  Will keep in hospital today.  Will tentatively aim for discharge tomorrow if she is stable.   Lennette Bihari, MD, [facility] 03/14/2023 11:23 AM

## 2023-03-14 NOTE — Progress Notes (Signed)
Pt very adamantly refusing SQ heparin injection. Rationale for medication explained to both patient and patient's son, however she continues to refuse the medication. Micah Flesher paged.

## 2023-03-14 NOTE — Progress Notes (Addendum)
Pt declined ambulation, sts she wants to go later. Could not encouraged her. Practiced IS, 500-700 ml. Encouraged more walking, sitting up. She does not have O2 at home at baseline. On 2L currently. Will f/u as time allows. Has PT and MT today as well. [id number] Shannon Scott BS, ACSM-CEP 03/14/2023 10:11 AM

## 2023-03-14 NOTE — Progress Notes (Signed)
Pt ambulated 137ft on room air. She did not require supplemental oxygen. O2 sats=94%. She did require a couple of short rest breaks during the walk but sats maintained on room air.  Patient Saturations on Room Air at Rest = 94%  Patient Saturations on [employer] while Ambulating = 94%

## 2023-03-14 NOTE — Progress Notes (Signed)
Pt has refused ambulating in the hall multiple times today. RN and mobility tech have both attempted to ambulate the patient, yet she continues to refuse to do so. Pt has been ambulating with assist to the bathroom throughout the day.

## 2023-03-15 ENCOUNTER — Other Ambulatory Visit (HOSPITAL_COMMUNITY): Payer: Self-pay

## 2023-03-15 ENCOUNTER — Ambulatory Visit: Payer: Medicare Other | Admitting: Adult Health

## 2023-03-15 ENCOUNTER — Encounter: Payer: Self-pay | Admitting: Family

## 2023-03-15 LAB — BASIC METABOLIC PANEL
Anion gap: 10 (ref 5–15)
BUN: 8 mg/dL (ref 8–23)
CO2: 26 mmol/L (ref 22–32)
Calcium: 8.9 mg/dL (ref 8.9–10.3)
Chloride: 100 mmol/L (ref 98–111)
Creatinine, Ser: 0.88 mg/dL (ref 0.44–1.00)
GFR, Estimated: >60 mL/min (ref >60)
Glucose, Bld: 127 mg/dL — ABNORMAL HIGH (ref 70–99)
Potassium: 3.7 mmol/L (ref 3.5–5.1)
Sodium: 136 mmol/L (ref 135–145)

## 2023-03-15 MED ORDER — APIXABAN 5 MG PO TABS
5.0000 mg | ORAL_TABLET | Freq: Two times a day (BID) | ORAL | 0 refills | Status: DC
  Filled 2023-03-15: qty 60, 30d supply, fill #0

## 2023-03-15 MED ORDER — HEPARIN SOD (PORK) LOCK FLUSH 100 UNIT/ML IV SOLN
500.0000 [IU] | INTRAVENOUS | Status: AC | PRN
  Administered 2023-03-15: 500 [IU]
  Filled 2023-03-15: qty 5

## 2023-03-15 MED ORDER — ASPIRIN 81 MG PO CHEW
81.0000 mg | CHEWABLE_TABLET | Freq: Every day | ORAL | 0 refills | Status: DC
  Filled 2023-03-15: qty 14, 14d supply, fill #0

## 2023-03-15 MED ORDER — POTASSIUM CHLORIDE CRYS ER 20 MEQ PO TBCR
40.0000 meq | EXTENDED_RELEASE_TABLET | Freq: Once | ORAL | Status: AC
  Administered 2023-03-15: 40 meq via ORAL
  Filled 2023-03-15: qty 2

## 2023-03-15 MED ORDER — DILTIAZEM HCL ER COATED BEADS 180 MG PO CP24
180.0000 mg | ORAL_CAPSULE | Freq: Every day | ORAL | 0 refills | Status: DC
  Filled 2023-03-15: qty 90, 90d supply, fill #0

## 2023-03-15 MED ORDER — CARVEDILOL 3.125 MG PO TABS
3.1250 mg | ORAL_TABLET | Freq: Two times a day (BID) | ORAL | 0 refills | Status: DC
  Filled 2023-03-15: qty 180, 90d supply, fill #0

## 2023-03-15 MED ORDER — CLOPIDOGREL BISULFATE 75 MG PO TABS
75.0000 mg | ORAL_TABLET | Freq: Every day | ORAL | 2 refills | Status: DC
  Filled 2023-03-15: qty 90, 90d supply, fill #0

## 2023-03-15 MED ORDER — FUROSEMIDE 20 MG PO TABS
ORAL_TABLET | ORAL | 1 refills | Status: DC
  Filled 2023-03-15: qty 30, 70d supply, fill #0
  Filled 2023-07-06: qty 30, 70d supply, fill #1

## 2023-03-15 NOTE — Progress Notes (Signed)
Discharge teaching complete. Meds, diet, activity, follow up appointments, post MI instructions reviewed and all questions answered. Copy of instructions given to patient and prescriptions sent to pharmacy. Meds sent to [facility]. Waiting for IV team to deaccess port and then will discharge the patient.

## 2023-03-15 NOTE — TOC Transition Note (Addendum)
Transition of Care [facility]) - CM/SW Discharge Note   Patient Details  Name: Shannon Scott MRN: [id number] Date of Birth: [birth year]  Transition of [facility] CM/SW Contact:  Gala Lewandowsky, RN Phone Number: 03/15/2023, 11:11 AM   Clinical Narrative: Case Manager spoke with patient and son. Plan will be for home with home health physical therapy with [facility]. Rolling walker with seat to be delivered from [employer]. Staff RN checking to see if the patient will need oxygen for home. Case Manager will continue to follow for additional transition of care needs.     1314 03-15-23 Per Staff RN no oxygen needs at this time.  Final next level of care: Home/Self Care Barriers to Discharge: No Barriers Identified   Patient Goals and CMS Choice   Choice offered to / list presented to : NA   Discharge Plan and Services Additional resources added to the After Visit Summary for     Discharge Planning Services: NA Post Acute Care Choice: NA          DME Arranged: Walker rolling with seat DME Agency: [employer] Date DME Agency Contacted: 03/14/23 Time DME Agency Contacted: 1513 Representative spoke with at DME Agency: Vaughan Basta. HH Arranged: PT HH Agency: [facility] ([facility]) Date HH Agency Contacted: 03/15/23 Time HH Agency Contacted: 1111 Representative spoke with at [facility] Agency: Morrie Sheldon  Social Determinants of Health (SDOH) Interventions SDOH Screenings   Food Insecurity: No Food Insecurity (03/14/2023)  Housing: Low Risk  (03/14/2023)  Transportation Needs: No Transportation Needs (03/14/2023)  Utilities: Not At Risk (03/14/2023)  Tobacco Use: Low Risk  (03/09/2023)   Readmission Risk Interventions     No data to display

## 2023-03-15 NOTE — Care Management Important Message (Signed)
Important Message  Patient Details  Name: Shannon Scott MRN: [id number] Date of Birth: 1940-11-04   Important Message Given:  Yes - Medicare IM     [name] 03/15/2023, 10:27 AM

## 2023-03-15 NOTE — Discharge Summary (Addendum)
Weight:       149.0 lb Date of Birth:  [birth year]          BSA:          1.667 m Patient Age:    82 years          BP:           126/48 mmHg Patient Gender: F                 HR:            68 bpm. Exam Location:  Inpatient Procedure: Limited Color Doppler and Cardiac Doppler Indications:     acute ischemic heart disease  History:         Patient has prior history of Echocardiogram examinations, most                  recent 03/03/2023. Prior CABG, Aortic Valve Disease; Risk                  Factors:Hypertension and Dyslipidemia.  Sonographer:     Delcie Roch RDCS Referring Phys:  [phone] MICHAEL COOPER Diagnosing Phys: Jodelle Red MD IMPRESSIONS  1. Left ventricular ejection fraction, by estimation, is 55 to 60%. The left ventricle has normal function. The left ventricle demonstrates regional wall motion abnormalities (see scoring diagram/findings for description). There is mild hypokinesis of the left ventricular, basal-mid anterolateral wall.  2. The mitral valve is degenerative. Moderate mitral valve regurgitation. No evidence of mitral stenosis. Severe mitral annular calcification.  3. Tricuspid valve regurgitation is mild to moderate.  4. The aortic valve is calcified. There is moderate calcification of the aortic valve. There is moderate thickening of the aortic valve. Aortic valve regurgitation is trivial. Moderate aortic valve stenosis.  5. There is mildly elevated pulmonary artery systolic pressure.  6. The inferior vena cava is normal in size with greater than 50% respiratory variability, suggesting right atrial pressure of 3 mmHg. Comparison(s): Changes from prior study are noted. Anterolateral wall motion abnormality appears new. FINDINGS  Left Ventricle: Left ventricular ejection fraction, by estimation, is 55 to 60%. The left ventricle has normal function. The left ventricle demonstrates regional wall motion abnormalities. Mild hypokinesis of the left ventricular, basal-mid anterolateral wall. Right Ventricle: There is mildly elevated pulmonary artery systolic pressure. The tricuspid regurgitant velocity is 2.95 m/s, and with an assumed right atrial pressure of 3 mmHg, the  estimated right ventricular systolic pressure is 37.8 mmHg. Mitral Valve: The mitral valve is degenerative in appearance. There is mild thickening of the mitral valve leaflet(s). There is mild calcification of the mitral valve leaflet(s). Severe mitral annular calcification. Moderate mitral valve regurgitation. No evidence of mitral valve stenosis. Tricuspid Valve: The tricuspid valve is normal in structure. Tricuspid valve regurgitation is mild to moderate. No evidence of tricuspid stenosis. Aortic Valve: Cannot exclude low flow low gradient severe AS. The aortic valve is calcified. There is moderate calcification of the aortic valve. There is moderate thickening of the aortic valve. Aortic valve regurgitation is trivial. Moderate aortic stenosis is present. Aortic valve mean gradient measures 20.0 mmHg. Aortic valve peak gradient measures 33.9 mmHg. Aortic valve area, by VTI measures 0.65 cm. Pulmonic Valve: The pulmonic valve was not well visualized. Pulmonic valve regurgitation is not visualized. Venous: The inferior vena cava is normal in size with greater than 50% respiratory variability, suggesting right atrial pressure of 3 mmHg. LEFT VENTRICLE PLAX 2D LVIDd:         4.30  Weight:       149.0 lb Date of Birth:  [birth year]          BSA:          1.667 m Patient Age:    82 years          BP:           126/48 mmHg Patient Gender: F                 HR:            68 bpm. Exam Location:  Inpatient Procedure: Limited Color Doppler and Cardiac Doppler Indications:     acute ischemic heart disease  History:         Patient has prior history of Echocardiogram examinations, most                  recent 03/03/2023. Prior CABG, Aortic Valve Disease; Risk                  Factors:Hypertension and Dyslipidemia.  Sonographer:     Delcie Roch RDCS Referring Phys:  [phone] MICHAEL COOPER Diagnosing Phys: Jodelle Red MD IMPRESSIONS  1. Left ventricular ejection fraction, by estimation, is 55 to 60%. The left ventricle has normal function. The left ventricle demonstrates regional wall motion abnormalities (see scoring diagram/findings for description). There is mild hypokinesis of the left ventricular, basal-mid anterolateral wall.  2. The mitral valve is degenerative. Moderate mitral valve regurgitation. No evidence of mitral stenosis. Severe mitral annular calcification.  3. Tricuspid valve regurgitation is mild to moderate.  4. The aortic valve is calcified. There is moderate calcification of the aortic valve. There is moderate thickening of the aortic valve. Aortic valve regurgitation is trivial. Moderate aortic valve stenosis.  5. There is mildly elevated pulmonary artery systolic pressure.  6. The inferior vena cava is normal in size with greater than 50% respiratory variability, suggesting right atrial pressure of 3 mmHg. Comparison(s): Changes from prior study are noted. Anterolateral wall motion abnormality appears new. FINDINGS  Left Ventricle: Left ventricular ejection fraction, by estimation, is 55 to 60%. The left ventricle has normal function. The left ventricle demonstrates regional wall motion abnormalities. Mild hypokinesis of the left ventricular, basal-mid anterolateral wall. Right Ventricle: There is mildly elevated pulmonary artery systolic pressure. The tricuspid regurgitant velocity is 2.95 m/s, and with an assumed right atrial pressure of 3 mmHg, the  estimated right ventricular systolic pressure is 37.8 mmHg. Mitral Valve: The mitral valve is degenerative in appearance. There is mild thickening of the mitral valve leaflet(s). There is mild calcification of the mitral valve leaflet(s). Severe mitral annular calcification. Moderate mitral valve regurgitation. No evidence of mitral valve stenosis. Tricuspid Valve: The tricuspid valve is normal in structure. Tricuspid valve regurgitation is mild to moderate. No evidence of tricuspid stenosis. Aortic Valve: Cannot exclude low flow low gradient severe AS. The aortic valve is calcified. There is moderate calcification of the aortic valve. There is moderate thickening of the aortic valve. Aortic valve regurgitation is trivial. Moderate aortic stenosis is present. Aortic valve mean gradient measures 20.0 mmHg. Aortic valve peak gradient measures 33.9 mmHg. Aortic valve area, by VTI measures 0.65 cm. Pulmonic Valve: The pulmonic valve was not well visualized. Pulmonic valve regurgitation is not visualized. Venous: The inferior vena cava is normal in size with greater than 50% respiratory variability, suggesting right atrial pressure of 3 mmHg. LEFT VENTRICLE PLAX 2D LVIDd:         4.30  been sent to the [facility] and Scheduling Pool at the office where the patient should be seen for follow up.  _____________  Discharge Vitals Blood pressure (!) 141/60, pulse 67, temperature 98.8 F (37.1 C), temperature source Oral, resp. rate 17, height 5\' 1"  (1.549 m), weight 66.9 kg, SpO2 96%.  Filed Weights   03/09/23 0423 03/11/23 0700  Weight: 67.6 kg 66.9 kg    Labs & Radiologic Studies    CBC Recent Labs    03/13/23 0500 03/14/23 1147  WBC 5.4 4.4  HGB 8.5* 8.7*  HCT 27.8* 28.0*  MCV 88.3 87.5  PLT 279 297   Basic Metabolic Panel Recent Labs    25/36/64 0508 03/15/23 0740  NA 137 136  K 3.8 3.7  CL 103 100  CO2 26 26  GLUCOSE 95 127*  BUN 8 8  CREATININE 0.85 0.88  CALCIUM 8.7* 8.9   Liver Function Tests No results for input(s): "AST", "ALT", "ALKPHOS", "BILITOT", "PROT",  "ALBUMIN" in the last 72 hours. No results for input(s): "LIPASE", "AMYLASE" in the last 72 hours. High Sensitivity Troponin:   Recent Labs  Lab 03/01/23 1428 03/01/23 1703 03/02/23 0023 03/09/23 0441 03/09/23 0738  TROPONINIHS 509* 519* 628* 238* 2,626*    BNP Invalid input(s): "POCBNP" D-Dimer No results for input(s): "DDIMER" in the last 72 hours. Hemoglobin A1C No results for input(s): "HGBA1C" in the last 72 hours. Fasting Lipid Panel No results for input(s): "CHOL", "HDL", "LDLCALC", "TRIG", "CHOLHDL", "LDLDIRECT" in the last 72 hours. Thyroid Function Tests No results for input(s): "TSH", "T4TOTAL", "T3FREE", "THYROIDAB" in the last 72 hours.  Invalid input(s): "FREET3" _____________  VAS [place] UPPER EXTREMITY ARTERIAL DUPLEX  Result Date: 03/14/2023  UPPER EXTREMITY DUPLEX STUDY Patient Name:  JENNIFERANN STUCKERT  Date of Exam:   03/14/2023 Medical Rec #: [id number]          Accession #:    [id number] Date of Birth: 1941-05-25           Patient Gender: F Patient Age:   75 years Exam Location:  [facility] Procedure:      VAS [place] UPPER EXTREMITY ARTERIAL DUPLEX Referring Phys: [facility] --------------------------------------------------------------------------------  Indications: Follow up Left radial artery pseudoaneurysm duplex to assess for              spontaneous thrombosis. History:     Patient has a history of catheterization via left radial artery.  Risk Factors: Hypertension, hyperlipidemia, prior MI, coronary artery disease. Comparison Study: Prior study done 03/09/23 Performing Technologist: Sherren Kerns RVS  Examination Guidelines: A complete evaluation includes B-mode imaging, spectral Doppler, color Doppler, and power Doppler as needed of all accessible portions of each vessel. Bilateral testing is considered an integral part of a complete examination. Limited examinations for reoccurring indications may be performed as noted.  Left Doppler Findings:  +-------------+----------+---------+--------+--------+ Site         PSV (cm/s)Waveform StenosisComments +-------------+----------+---------+--------+--------+ Brachial Dist          triphasic                 +-------------+----------+---------+--------+--------+ Radial Prox            triphasic                 +-------------+----------+---------+--------+--------+ Radial Mid             triphasic                 +-------------+----------+---------+--------+--------+ Ulnar Prox  Discharge Summary    Patient ID: TECORA EUSTACHE MRN: [id number]; DOB: December 19, 1940  Admit date: 03/09/2023 Discharge date: 03/15/2023  PCP:  [name], MD   [facility] Providers Cardiologist:  Chrystie Nose, MD     Discharge Diagnoses    Principal Problem:   STEMI involving saphenous vein graft to left circumflex coronary artery [facility]) Active Problems:   Essential hypertension   S/P CABG x 3   Familial hypercholesteremia   Endometrial cancer (HCC)   Goals of care, counseling/discussion   IDA (iron deficiency anemia)   Lower extremity edema   Paroxysmal atrial fibrillation (HCC)   Recent non-STEMI   Severe aortic stenosis   Coronary artery disease involving native coronary artery of native heart with angina pectoris (HCC)   Coronary artery disease involving coronary bypass graft of native heart with angina pectoris (HCC)   Pseudoaneurysm following procedure (HCC) -R Radial Artery   Statin intolerance    Diagnostic Studies/Procedures    Cath: 03/09/2023  1.  Known severe native vessel coronary artery disease, native coronary arteries not selectively imaged this procedure 2.  Known patency of the LIMA to LAD, this graft not selectively imaged this procedure 3.  Severe diffuse SVG to RCA disease, medical therapy recommended 4.  Acute total occlusion of a severely diseased saphenous vein graft to OM, treated successfully with primary PCI using a 3.0 x 12 mm Synergy DES   Recommendations: Okay to resume apixaban tomorrow morning, would use aspirin 81 mg daily x 2 weeks and this patient at high bleeding risk, clopidogrel 75 mg daily x 6 months minimum favor long-term if tolerated (loaded with 600 mg in the emergency room) _____________   History of Present Illness     Shannon Scott is a 82 y.o. female with hx of 3V CABG LIMA-LAD, SVG-OM, SVG-RCA, severe aortic stenosis, locally advanced endometrial cancer status post chemoradiation,  immunotherapy, hysterectomy BSO, colonic mass with partial colectomy and colostomy, prior DVT, A-fib on apixaban, hypertension, hyperlipidemia, statin intolerance, lymphedema, asthma, hypothyroidism  who was seen on 03/09/2023 for the evaluation of inferior STEMI.   Ms. Shoun presented from home with substernal chest pain 5-6 in intensity now. ECG shows inferior STEMI with STE in lead 3 more than lead 2 and STD in lateral leads with subtle STE in V6. She did just have a LHC for NSTEMI with Dr. Jacinto Halim and because of comorbidities, graft anatomy, medical management was recommended.  Unfortunately she presents with sudden onset substernal chest pain since 6 PM that was severe and unrelenting.  She was clutching her chest.  She was hemodynamically stable.  STEMI team was activated by the emergency department and case reviewed with Dr. Excell Seltzer who elected to take her emergently to the Cath Lab for LHC.  Prior to transport she was loaded with Plavix 600 mg.  It was communicated that she did have her last dose of Eliquis last night at 6 PM. Of note she is being worked up for TAVR and was recently discussed at heart team.    Hospital Course     Consultants: VVS   CAD Inferior STEMI -- She was taken emergently to the cath lab 03/09/23 with PCI/DES to SVG-OM. Otherwise stable extensive disease including tandem lesions in the SVG-dRCA of 70% followed by 70%. Recommendations for DAPT with ASA/plavix for total of 2 weeks, then stop ASA.  -- with development of PSA of left radial she will continue ASA for 2 weeks then plan to resume Eliquis  been sent to the [facility] and Scheduling Pool at the office where the patient should be seen for follow up.  _____________  Discharge Vitals Blood pressure (!) 141/60, pulse 67, temperature 98.8 F (37.1 C), temperature source Oral, resp. rate 17, height 5\' 1"  (1.549 m), weight 66.9 kg, SpO2 96%.  Filed Weights   03/09/23 0423 03/11/23 0700  Weight: 67.6 kg 66.9 kg    Labs & Radiologic Studies    CBC Recent Labs    03/13/23 0500 03/14/23 1147  WBC 5.4 4.4  HGB 8.5* 8.7*  HCT 27.8* 28.0*  MCV 88.3 87.5  PLT 279 297   Basic Metabolic Panel Recent Labs    25/36/64 0508 03/15/23 0740  NA 137 136  K 3.8 3.7  CL 103 100  CO2 26 26  GLUCOSE 95 127*  BUN 8 8  CREATININE 0.85 0.88  CALCIUM 8.7* 8.9   Liver Function Tests No results for input(s): "AST", "ALT", "ALKPHOS", "BILITOT", "PROT",  "ALBUMIN" in the last 72 hours. No results for input(s): "LIPASE", "AMYLASE" in the last 72 hours. High Sensitivity Troponin:   Recent Labs  Lab 03/01/23 1428 03/01/23 1703 03/02/23 0023 03/09/23 0441 03/09/23 0738  TROPONINIHS 509* 519* 628* 238* 2,626*    BNP Invalid input(s): "POCBNP" D-Dimer No results for input(s): "DDIMER" in the last 72 hours. Hemoglobin A1C No results for input(s): "HGBA1C" in the last 72 hours. Fasting Lipid Panel No results for input(s): "CHOL", "HDL", "LDLCALC", "TRIG", "CHOLHDL", "LDLDIRECT" in the last 72 hours. Thyroid Function Tests No results for input(s): "TSH", "T4TOTAL", "T3FREE", "THYROIDAB" in the last 72 hours.  Invalid input(s): "FREET3" _____________  VAS [place] UPPER EXTREMITY ARTERIAL DUPLEX  Result Date: 03/14/2023  UPPER EXTREMITY DUPLEX STUDY Patient Name:  JENNIFERANN STUCKERT  Date of Exam:   03/14/2023 Medical Rec #: [id number]          Accession #:    [id number] Date of Birth: 1941-05-25           Patient Gender: F Patient Age:   75 years Exam Location:  [facility] Procedure:      VAS [place] UPPER EXTREMITY ARTERIAL DUPLEX Referring Phys: [facility] --------------------------------------------------------------------------------  Indications: Follow up Left radial artery pseudoaneurysm duplex to assess for              spontaneous thrombosis. History:     Patient has a history of catheterization via left radial artery.  Risk Factors: Hypertension, hyperlipidemia, prior MI, coronary artery disease. Comparison Study: Prior study done 03/09/23 Performing Technologist: Sherren Kerns RVS  Examination Guidelines: A complete evaluation includes B-mode imaging, spectral Doppler, color Doppler, and power Doppler as needed of all accessible portions of each vessel. Bilateral testing is considered an integral part of a complete examination. Limited examinations for reoccurring indications may be performed as noted.  Left Doppler Findings:  +-------------+----------+---------+--------+--------+ Site         PSV (cm/s)Waveform StenosisComments +-------------+----------+---------+--------+--------+ Brachial Dist          triphasic                 +-------------+----------+---------+--------+--------+ Radial Prox            triphasic                 +-------------+----------+---------+--------+--------+ Radial Mid             triphasic                 +-------------+----------+---------+--------+--------+ Ulnar Prox  Weight:       149.0 lb Date of Birth:  [birth year]          BSA:          1.667 m Patient Age:    82 years          BP:           126/48 mmHg Patient Gender: F                 HR:            68 bpm. Exam Location:  Inpatient Procedure: Limited Color Doppler and Cardiac Doppler Indications:     acute ischemic heart disease  History:         Patient has prior history of Echocardiogram examinations, most                  recent 03/03/2023. Prior CABG, Aortic Valve Disease; Risk                  Factors:Hypertension and Dyslipidemia.  Sonographer:     Delcie Roch RDCS Referring Phys:  [phone] MICHAEL COOPER Diagnosing Phys: Jodelle Red MD IMPRESSIONS  1. Left ventricular ejection fraction, by estimation, is 55 to 60%. The left ventricle has normal function. The left ventricle demonstrates regional wall motion abnormalities (see scoring diagram/findings for description). There is mild hypokinesis of the left ventricular, basal-mid anterolateral wall.  2. The mitral valve is degenerative. Moderate mitral valve regurgitation. No evidence of mitral stenosis. Severe mitral annular calcification.  3. Tricuspid valve regurgitation is mild to moderate.  4. The aortic valve is calcified. There is moderate calcification of the aortic valve. There is moderate thickening of the aortic valve. Aortic valve regurgitation is trivial. Moderate aortic valve stenosis.  5. There is mildly elevated pulmonary artery systolic pressure.  6. The inferior vena cava is normal in size with greater than 50% respiratory variability, suggesting right atrial pressure of 3 mmHg. Comparison(s): Changes from prior study are noted. Anterolateral wall motion abnormality appears new. FINDINGS  Left Ventricle: Left ventricular ejection fraction, by estimation, is 55 to 60%. The left ventricle has normal function. The left ventricle demonstrates regional wall motion abnormalities. Mild hypokinesis of the left ventricular, basal-mid anterolateral wall. Right Ventricle: There is mildly elevated pulmonary artery systolic pressure. The tricuspid regurgitant velocity is 2.95 m/s, and with an assumed right atrial pressure of 3 mmHg, the  estimated right ventricular systolic pressure is 37.8 mmHg. Mitral Valve: The mitral valve is degenerative in appearance. There is mild thickening of the mitral valve leaflet(s). There is mild calcification of the mitral valve leaflet(s). Severe mitral annular calcification. Moderate mitral valve regurgitation. No evidence of mitral valve stenosis. Tricuspid Valve: The tricuspid valve is normal in structure. Tricuspid valve regurgitation is mild to moderate. No evidence of tricuspid stenosis. Aortic Valve: Cannot exclude low flow low gradient severe AS. The aortic valve is calcified. There is moderate calcification of the aortic valve. There is moderate thickening of the aortic valve. Aortic valve regurgitation is trivial. Moderate aortic stenosis is present. Aortic valve mean gradient measures 20.0 mmHg. Aortic valve peak gradient measures 33.9 mmHg. Aortic valve area, by VTI measures 0.65 cm. Pulmonic Valve: The pulmonic valve was not well visualized. Pulmonic valve regurgitation is not visualized. Venous: The inferior vena cava is normal in size with greater than 50% respiratory variability, suggesting right atrial pressure of 3 mmHg. LEFT VENTRICLE PLAX 2D LVIDd:         4.30  been sent to the [facility] and Scheduling Pool at the office where the patient should be seen for follow up.  _____________  Discharge Vitals Blood pressure (!) 141/60, pulse 67, temperature 98.8 F (37.1 C), temperature source Oral, resp. rate 17, height 5\' 1"  (1.549 m), weight 66.9 kg, SpO2 96%.  Filed Weights   03/09/23 0423 03/11/23 0700  Weight: 67.6 kg 66.9 kg    Labs & Radiologic Studies    CBC Recent Labs    03/13/23 0500 03/14/23 1147  WBC 5.4 4.4  HGB 8.5* 8.7*  HCT 27.8* 28.0*  MCV 88.3 87.5  PLT 279 297   Basic Metabolic Panel Recent Labs    25/36/64 0508 03/15/23 0740  NA 137 136  K 3.8 3.7  CL 103 100  CO2 26 26  GLUCOSE 95 127*  BUN 8 8  CREATININE 0.85 0.88  CALCIUM 8.7* 8.9   Liver Function Tests No results for input(s): "AST", "ALT", "ALKPHOS", "BILITOT", "PROT",  "ALBUMIN" in the last 72 hours. No results for input(s): "LIPASE", "AMYLASE" in the last 72 hours. High Sensitivity Troponin:   Recent Labs  Lab 03/01/23 1428 03/01/23 1703 03/02/23 0023 03/09/23 0441 03/09/23 0738  TROPONINIHS 509* 519* 628* 238* 2,626*    BNP Invalid input(s): "POCBNP" D-Dimer No results for input(s): "DDIMER" in the last 72 hours. Hemoglobin A1C No results for input(s): "HGBA1C" in the last 72 hours. Fasting Lipid Panel No results for input(s): "CHOL", "HDL", "LDLCALC", "TRIG", "CHOLHDL", "LDLDIRECT" in the last 72 hours. Thyroid Function Tests No results for input(s): "TSH", "T4TOTAL", "T3FREE", "THYROIDAB" in the last 72 hours.  Invalid input(s): "FREET3" _____________  VAS [place] UPPER EXTREMITY ARTERIAL DUPLEX  Result Date: 03/14/2023  UPPER EXTREMITY DUPLEX STUDY Patient Name:  JENNIFERANN STUCKERT  Date of Exam:   03/14/2023 Medical Rec #: [id number]          Accession #:    [id number] Date of Birth: 1941-05-25           Patient Gender: F Patient Age:   75 years Exam Location:  [facility] Procedure:      VAS [place] UPPER EXTREMITY ARTERIAL DUPLEX Referring Phys: [facility] --------------------------------------------------------------------------------  Indications: Follow up Left radial artery pseudoaneurysm duplex to assess for              spontaneous thrombosis. History:     Patient has a history of catheterization via left radial artery.  Risk Factors: Hypertension, hyperlipidemia, prior MI, coronary artery disease. Comparison Study: Prior study done 03/09/23 Performing Technologist: Sherren Kerns RVS  Examination Guidelines: A complete evaluation includes B-mode imaging, spectral Doppler, color Doppler, and power Doppler as needed of all accessible portions of each vessel. Bilateral testing is considered an integral part of a complete examination. Limited examinations for reoccurring indications may be performed as noted.  Left Doppler Findings:  +-------------+----------+---------+--------+--------+ Site         PSV (cm/s)Waveform StenosisComments +-------------+----------+---------+--------+--------+ Brachial Dist          triphasic                 +-------------+----------+---------+--------+--------+ Radial Prox            triphasic                 +-------------+----------+---------+--------+--------+ Radial Mid             triphasic                 +-------------+----------+---------+--------+--------+ Ulnar Prox  Height:       61.0 in Accession #:    [id number]        Weight:       147.9 lb Date of Birth:  1941/01/28          BSA:          1.662 m Patient Age:    82 years          BP:           130/54 mmHg Patient Gender: F                 HR:           65 bpm. Exam Location:  Inpatient Procedure: 2D Echo, Cardiac Doppler and Color Doppler Indications:    NSTEMI                 Aortic stenosis  History:        Patient has prior history of Echocardiogram examinations, most                 recent 03/02/2022. CAD, Prior CABG, Carotid Disease and Stroke,                 Aortic Valve Disease, Arrythmias:Atrial Fibrillation; Risk                 Factors:Hypertension and Dyslipidemia. Cancer.  Sonographer:    Milda Smart Referring Phys: [id number] JONATHAN SEGARS  Sonographer Comments: Image acquisition challenging due to patient body habitus and Image acquisition challenging due to respiratory motion. IMPRESSIONS  1. Left ventricular ejection fraction, by estimation, is 55 to 60%. The left ventricle has normal function. The left ventricle has no regional wall motion abnormalities. There is mild concentric left  ventricular hypertrophy. Left ventricular diastolic parameters are consistent with Grade II diastolic dysfunction (pseudonormalization).  2. Right ventricular systolic function is low normal. The right ventricular size is normal. There is normal pulmonary artery systolic pressure. The estimated right ventricular systolic pressure is 24.7 mmHg.  3. Left atrial size was mildly dilated.  4. Right atrial size was mildly dilated.  5. The mitral valve is degenerative. Mild to moderate mitral valve regurgitation. No evidence of mitral stenosis. Moderate to severe mitral annular calcification.  6. Tricuspid valve regurgitation is moderate.  7. The aortic valve is tricuspid. There is moderate calcification of the aortic valve. Aortic valve regurgitation is trivial. Suspect severe paradoxical low flow/low gradient aortic valve stenosis. Aortic valve area, by VTI measures 0.79 cm. Aortic valve mean gradient measures 24.0 mmHg.  8. The inferior vena cava is normal in size with greater than 50% respiratory variability, suggesting right atrial pressure of 3 mmHg. FINDINGS  Left Ventricle: Left ventricular ejection fraction, by estimation, is 55 to 60%. The left ventricle has normal function. The left ventricle has no regional wall motion abnormalities. The left ventricular internal cavity size was normal in size. There is  mild concentric left ventricular hypertrophy. Left ventricular diastolic parameters are consistent with Grade II diastolic dysfunction (pseudonormalization). Right Ventricle: The right ventricular size is normal. No increase in right ventricular wall thickness. Right ventricular systolic function is low normal. There is normal pulmonary artery systolic pressure. The tricuspid regurgitant velocity is 2.33 m/s,  and with an assumed right atrial pressure of 3 mmHg, the estimated right ventricular systolic pressure is 24.7 mmHg. Left Atrium: Left atrial size was mildly dilated. Right Atrium: Right atrial size was  mildly dilated. Pericardium: There is no evidence of pericardial  Discharge Summary    Patient ID: TECORA EUSTACHE MRN: [id number]; DOB: December 19, 1940  Admit date: 03/09/2023 Discharge date: 03/15/2023  PCP:  [name], MD   [facility] Providers Cardiologist:  Chrystie Nose, MD     Discharge Diagnoses    Principal Problem:   STEMI involving saphenous vein graft to left circumflex coronary artery [facility]) Active Problems:   Essential hypertension   S/P CABG x 3   Familial hypercholesteremia   Endometrial cancer (HCC)   Goals of care, counseling/discussion   IDA (iron deficiency anemia)   Lower extremity edema   Paroxysmal atrial fibrillation (HCC)   Recent non-STEMI   Severe aortic stenosis   Coronary artery disease involving native coronary artery of native heart with angina pectoris (HCC)   Coronary artery disease involving coronary bypass graft of native heart with angina pectoris (HCC)   Pseudoaneurysm following procedure (HCC) -R Radial Artery   Statin intolerance    Diagnostic Studies/Procedures    Cath: 03/09/2023  1.  Known severe native vessel coronary artery disease, native coronary arteries not selectively imaged this procedure 2.  Known patency of the LIMA to LAD, this graft not selectively imaged this procedure 3.  Severe diffuse SVG to RCA disease, medical therapy recommended 4.  Acute total occlusion of a severely diseased saphenous vein graft to OM, treated successfully with primary PCI using a 3.0 x 12 mm Synergy DES   Recommendations: Okay to resume apixaban tomorrow morning, would use aspirin 81 mg daily x 2 weeks and this patient at high bleeding risk, clopidogrel 75 mg daily x 6 months minimum favor long-term if tolerated (loaded with 600 mg in the emergency room) _____________   History of Present Illness     Shannon Scott is a 82 y.o. female with hx of 3V CABG LIMA-LAD, SVG-OM, SVG-RCA, severe aortic stenosis, locally advanced endometrial cancer status post chemoradiation,  immunotherapy, hysterectomy BSO, colonic mass with partial colectomy and colostomy, prior DVT, A-fib on apixaban, hypertension, hyperlipidemia, statin intolerance, lymphedema, asthma, hypothyroidism  who was seen on 03/09/2023 for the evaluation of inferior STEMI.   Ms. Shoun presented from home with substernal chest pain 5-6 in intensity now. ECG shows inferior STEMI with STE in lead 3 more than lead 2 and STD in lateral leads with subtle STE in V6. She did just have a LHC for NSTEMI with Dr. Jacinto Halim and because of comorbidities, graft anatomy, medical management was recommended.  Unfortunately she presents with sudden onset substernal chest pain since 6 PM that was severe and unrelenting.  She was clutching her chest.  She was hemodynamically stable.  STEMI team was activated by the emergency department and case reviewed with Dr. Excell Seltzer who elected to take her emergently to the Cath Lab for LHC.  Prior to transport she was loaded with Plavix 600 mg.  It was communicated that she did have her last dose of Eliquis last night at 6 PM. Of note she is being worked up for TAVR and was recently discussed at heart team.    Hospital Course     Consultants: VVS   CAD Inferior STEMI -- She was taken emergently to the cath lab 03/09/23 with PCI/DES to SVG-OM. Otherwise stable extensive disease including tandem lesions in the SVG-dRCA of 70% followed by 70%. Recommendations for DAPT with ASA/plavix for total of 2 weeks, then stop ASA.  -- with development of PSA of left radial she will continue ASA for 2 weeks then plan to resume Eliquis  Weight:       149.0 lb Date of Birth:  [birth year]          BSA:          1.667 m Patient Age:    82 years          BP:           126/48 mmHg Patient Gender: F                 HR:            68 bpm. Exam Location:  Inpatient Procedure: Limited Color Doppler and Cardiac Doppler Indications:     acute ischemic heart disease  History:         Patient has prior history of Echocardiogram examinations, most                  recent 03/03/2023. Prior CABG, Aortic Valve Disease; Risk                  Factors:Hypertension and Dyslipidemia.  Sonographer:     Delcie Roch RDCS Referring Phys:  [phone] MICHAEL COOPER Diagnosing Phys: Jodelle Red MD IMPRESSIONS  1. Left ventricular ejection fraction, by estimation, is 55 to 60%. The left ventricle has normal function. The left ventricle demonstrates regional wall motion abnormalities (see scoring diagram/findings for description). There is mild hypokinesis of the left ventricular, basal-mid anterolateral wall.  2. The mitral valve is degenerative. Moderate mitral valve regurgitation. No evidence of mitral stenosis. Severe mitral annular calcification.  3. Tricuspid valve regurgitation is mild to moderate.  4. The aortic valve is calcified. There is moderate calcification of the aortic valve. There is moderate thickening of the aortic valve. Aortic valve regurgitation is trivial. Moderate aortic valve stenosis.  5. There is mildly elevated pulmonary artery systolic pressure.  6. The inferior vena cava is normal in size with greater than 50% respiratory variability, suggesting right atrial pressure of 3 mmHg. Comparison(s): Changes from prior study are noted. Anterolateral wall motion abnormality appears new. FINDINGS  Left Ventricle: Left ventricular ejection fraction, by estimation, is 55 to 60%. The left ventricle has normal function. The left ventricle demonstrates regional wall motion abnormalities. Mild hypokinesis of the left ventricular, basal-mid anterolateral wall. Right Ventricle: There is mildly elevated pulmonary artery systolic pressure. The tricuspid regurgitant velocity is 2.95 m/s, and with an assumed right atrial pressure of 3 mmHg, the  estimated right ventricular systolic pressure is 37.8 mmHg. Mitral Valve: The mitral valve is degenerative in appearance. There is mild thickening of the mitral valve leaflet(s). There is mild calcification of the mitral valve leaflet(s). Severe mitral annular calcification. Moderate mitral valve regurgitation. No evidence of mitral valve stenosis. Tricuspid Valve: The tricuspid valve is normal in structure. Tricuspid valve regurgitation is mild to moderate. No evidence of tricuspid stenosis. Aortic Valve: Cannot exclude low flow low gradient severe AS. The aortic valve is calcified. There is moderate calcification of the aortic valve. There is moderate thickening of the aortic valve. Aortic valve regurgitation is trivial. Moderate aortic stenosis is present. Aortic valve mean gradient measures 20.0 mmHg. Aortic valve peak gradient measures 33.9 mmHg. Aortic valve area, by VTI measures 0.65 cm. Pulmonic Valve: The pulmonic valve was not well visualized. Pulmonic valve regurgitation is not visualized. Venous: The inferior vena cava is normal in size with greater than 50% respiratory variability, suggesting right atrial pressure of 3 mmHg. LEFT VENTRICLE PLAX 2D LVIDd:         4.30  Height:       61.0 in Accession #:    [id number]        Weight:       147.9 lb Date of Birth:  1941/01/28          BSA:          1.662 m Patient Age:    82 years          BP:           130/54 mmHg Patient Gender: F                 HR:           65 bpm. Exam Location:  Inpatient Procedure: 2D Echo, Cardiac Doppler and Color Doppler Indications:    NSTEMI                 Aortic stenosis  History:        Patient has prior history of Echocardiogram examinations, most                 recent 03/02/2022. CAD, Prior CABG, Carotid Disease and Stroke,                 Aortic Valve Disease, Arrythmias:Atrial Fibrillation; Risk                 Factors:Hypertension and Dyslipidemia. Cancer.  Sonographer:    Milda Smart Referring Phys: [id number] JONATHAN SEGARS  Sonographer Comments: Image acquisition challenging due to patient body habitus and Image acquisition challenging due to respiratory motion. IMPRESSIONS  1. Left ventricular ejection fraction, by estimation, is 55 to 60%. The left ventricle has normal function. The left ventricle has no regional wall motion abnormalities. There is mild concentric left  ventricular hypertrophy. Left ventricular diastolic parameters are consistent with Grade II diastolic dysfunction (pseudonormalization).  2. Right ventricular systolic function is low normal. The right ventricular size is normal. There is normal pulmonary artery systolic pressure. The estimated right ventricular systolic pressure is 24.7 mmHg.  3. Left atrial size was mildly dilated.  4. Right atrial size was mildly dilated.  5. The mitral valve is degenerative. Mild to moderate mitral valve regurgitation. No evidence of mitral stenosis. Moderate to severe mitral annular calcification.  6. Tricuspid valve regurgitation is moderate.  7. The aortic valve is tricuspid. There is moderate calcification of the aortic valve. Aortic valve regurgitation is trivial. Suspect severe paradoxical low flow/low gradient aortic valve stenosis. Aortic valve area, by VTI measures 0.79 cm. Aortic valve mean gradient measures 24.0 mmHg.  8. The inferior vena cava is normal in size with greater than 50% respiratory variability, suggesting right atrial pressure of 3 mmHg. FINDINGS  Left Ventricle: Left ventricular ejection fraction, by estimation, is 55 to 60%. The left ventricle has normal function. The left ventricle has no regional wall motion abnormalities. The left ventricular internal cavity size was normal in size. There is  mild concentric left ventricular hypertrophy. Left ventricular diastolic parameters are consistent with Grade II diastolic dysfunction (pseudonormalization). Right Ventricle: The right ventricular size is normal. No increase in right ventricular wall thickness. Right ventricular systolic function is low normal. There is normal pulmonary artery systolic pressure. The tricuspid regurgitant velocity is 2.33 m/s,  and with an assumed right atrial pressure of 3 mmHg, the estimated right ventricular systolic pressure is 24.7 mmHg. Left Atrium: Left atrial size was mildly dilated. Right Atrium: Right atrial size was  mildly dilated. Pericardium: There is no evidence of pericardial  been sent to the [facility] and Scheduling Pool at the office where the patient should be seen for follow up.  _____________  Discharge Vitals Blood pressure (!) 141/60, pulse 67, temperature 98.8 F (37.1 C), temperature source Oral, resp. rate 17, height 5\' 1"  (1.549 m), weight 66.9 kg, SpO2 96%.  Filed Weights   03/09/23 0423 03/11/23 0700  Weight: 67.6 kg 66.9 kg    Labs & Radiologic Studies    CBC Recent Labs    03/13/23 0500 03/14/23 1147  WBC 5.4 4.4  HGB 8.5* 8.7*  HCT 27.8* 28.0*  MCV 88.3 87.5  PLT 279 297   Basic Metabolic Panel Recent Labs    25/36/64 0508 03/15/23 0740  NA 137 136  K 3.8 3.7  CL 103 100  CO2 26 26  GLUCOSE 95 127*  BUN 8 8  CREATININE 0.85 0.88  CALCIUM 8.7* 8.9   Liver Function Tests No results for input(s): "AST", "ALT", "ALKPHOS", "BILITOT", "PROT",  "ALBUMIN" in the last 72 hours. No results for input(s): "LIPASE", "AMYLASE" in the last 72 hours. High Sensitivity Troponin:   Recent Labs  Lab 03/01/23 1428 03/01/23 1703 03/02/23 0023 03/09/23 0441 03/09/23 0738  TROPONINIHS 509* 519* 628* 238* 2,626*    BNP Invalid input(s): "POCBNP" D-Dimer No results for input(s): "DDIMER" in the last 72 hours. Hemoglobin A1C No results for input(s): "HGBA1C" in the last 72 hours. Fasting Lipid Panel No results for input(s): "CHOL", "HDL", "LDLCALC", "TRIG", "CHOLHDL", "LDLDIRECT" in the last 72 hours. Thyroid Function Tests No results for input(s): "TSH", "T4TOTAL", "T3FREE", "THYROIDAB" in the last 72 hours.  Invalid input(s): "FREET3" _____________  VAS [place] UPPER EXTREMITY ARTERIAL DUPLEX  Result Date: 03/14/2023  UPPER EXTREMITY DUPLEX STUDY Patient Name:  JENNIFERANN STUCKERT  Date of Exam:   03/14/2023 Medical Rec #: [id number]          Accession #:    [id number] Date of Birth: 1941-05-25           Patient Gender: F Patient Age:   75 years Exam Location[facility]  [facility] Procedure:      VAS [place] UPPER EXTREMITY ARTERIAL DUPLEX Referring Phys: [facility] --------------------------------------------------------------------------------  Indications: Follow up Left radial artery pseudoaneurysm duplex to assess for              spontaneous thrombosis. History:     Patient has a history of catheterization via left radial artery.  Risk Factors: Hypertension, hyperlipidemia, prior MI, coronary artery disease. Comparison Study: Prior study done 03/09/23 Performing Technologist: Sherren Kerns RVS  Examination Guidelines: A complete evaluation includes B-mode imaging, spectral Doppler, color Doppler, and power Doppler as needed of all accessible portions of each vessel. Bilateral testing is considered an integral part of a complete examination. Limited examinations for reoccurring indications may be performed as noted.  Left Doppler Findings:  +-------------+----------+---------+--------+--------+ Site         PSV (cm/s)Waveform StenosisComments +-------------+----------+---------+--------+--------+ Brachial Dist          triphasic                 +-------------+----------+---------+--------+--------+ Radial Prox            triphasic                 +-------------+----------+---------+--------+--------+ Radial Mid             triphasic                 +-------------+----------+---------+--------+--------+ Ulnar Prox  triphasic                 +-------------+----------+---------+--------+--------+   Summary:  Left: 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 Small hematoma remains measuring       0.47 cm X 0.32 cm. *See table(s) above for measurements and observations. Electronically signed by Gerarda Fraction on 03/14/2023 at 10:57:06 AM.    Final    VAS [place] UPPER EXTREMITY ARTERIAL DUPLEX  Result Date: 03/09/2023  UPPER EXTREMITY DUPLEX STUDY Patient Name:  PAMULA LUTHER  Date of Exam:   03/09/2023 Medical Rec #: [id number]          Accession #:    [id number] Date of Birth: 10/08/1940           Patient Gender: F Patient Age:   16 years Exam Location:  [facility] Procedure:      VAS [place] UPPER EXTREMITY ARTERIAL DUPLEX Referring Phys: Casimiro Needle COOPER --------------------------------------------------------------------------------  Indications: Bruising and palpable knot. History:     Patient has a history of catheterization via right radial artery.  Risk Factors: Hypertension, hyperlipidemia, coronary artery disease. Comparison Study: No prior study Performing Technologist: Shona Simpson  Examination Guidelines: A complete evaluation includes B-mode imaging, spectral Doppler, color Doppler, and power Doppler as needed of all accessible portions of each vessel. Bilateral testing is considered an integral part of a complete examination. Limited examinations for  reoccurring indications may be performed as noted.  Right Doppler Findings: +--------------+----------+--------+--------+--------+ Site          PSV (cm/s)WaveformStenosisComments +--------------+----------+--------+--------+--------+ Subclavian Mid93        biphasic                 +--------------+----------+--------+--------+--------+   An area with well defined borders measuring 0.6 cm x 0.4 cm was visualized arising off of the left radial artery distal with ultrasound characteristics of a pseudoaneurysm. The neck measures approximately 0.3 cm wide and 0.3 cm long. Left Doppler Findings: +--------------+----------+--------+--------+--------+ Site          PSV (cm/s)WaveformStenosisComments +--------------+----------+--------+--------+--------+ Subclavian Mid80        biphasic                 +--------------+----------+--------+--------+--------+ Axillary      88        biphasic                 +--------------+----------+--------+--------+--------+ Brachial Dist 106       biphasic                 +--------------+----------+--------+--------+--------+ Radial Dist   69        biphasic                 +--------------+----------+--------+--------+--------+ Ulnar Dist    67        biphasic                 +--------------+----------+--------+--------+--------+   Summary:  Left: Pseudoaneurysn seen at left distal radial artery. *See table(s) above for measurements and observations. Electronically signed by Coral Else MD on 03/09/2023 at 9:04:43 PM.    Final    ECHOCARDIOGRAM LIMITED  Result Date: 03/09/2023    ECHOCARDIOGRAM LIMITED REPORT   Patient Name:   KEYANDRA SWENSON Date of Exam: 03/09/2023 Medical Rec #:  [id number]         Height:       61.0 in Accession #:    [id number]  Discharge Summary    Patient ID: TECORA EUSTACHE MRN: [id number]; DOB: December 19, 1940  Admit date: 03/09/2023 Discharge date: 03/15/2023  PCP:  [name], MD   [facility] Providers Cardiologist:  Chrystie Nose, MD     Discharge Diagnoses    Principal Problem:   STEMI involving saphenous vein graft to left circumflex coronary artery [facility]) Active Problems:   Essential hypertension   S/P CABG x 3   Familial hypercholesteremia   Endometrial cancer (HCC)   Goals of care, counseling/discussion   IDA (iron deficiency anemia)   Lower extremity edema   Paroxysmal atrial fibrillation (HCC)   Recent non-STEMI   Severe aortic stenosis   Coronary artery disease involving native coronary artery of native heart with angina pectoris (HCC)   Coronary artery disease involving coronary bypass graft of native heart with angina pectoris (HCC)   Pseudoaneurysm following procedure (HCC) -R Radial Artery   Statin intolerance    Diagnostic Studies/Procedures    Cath: 03/09/2023  1.  Known severe native vessel coronary artery disease, native coronary arteries not selectively imaged this procedure 2.  Known patency of the LIMA to LAD, this graft not selectively imaged this procedure 3.  Severe diffuse SVG to RCA disease, medical therapy recommended 4.  Acute total occlusion of a severely diseased saphenous vein graft to OM, treated successfully with primary PCI using a 3.0 x 12 mm Synergy DES   Recommendations: Okay to resume apixaban tomorrow morning, would use aspirin 81 mg daily x 2 weeks and this patient at high bleeding risk, clopidogrel 75 mg daily x 6 months minimum favor long-term if tolerated (loaded with 600 mg in the emergency room) _____________   History of Present Illness     Shannon Scott is a 82 y.o. female with hx of 3V CABG LIMA-LAD, SVG-OM, SVG-RCA, severe aortic stenosis, locally advanced endometrial cancer status post chemoradiation,  immunotherapy, hysterectomy BSO, colonic mass with partial colectomy and colostomy, prior DVT, A-fib on apixaban, hypertension, hyperlipidemia, statin intolerance, lymphedema, asthma, hypothyroidism  who was seen on 03/09/2023 for the evaluation of inferior STEMI.   Ms. Shoun presented from home with substernal chest pain 5-6 in intensity now. ECG shows inferior STEMI with STE in lead 3 more than lead 2 and STD in lateral leads with subtle STE in V6. She did just have a LHC for NSTEMI with Dr. Jacinto Halim and because of comorbidities, graft anatomy, medical management was recommended.  Unfortunately she presents with sudden onset substernal chest pain since 6 PM that was severe and unrelenting.  She was clutching her chest.  She was hemodynamically stable.  STEMI team was activated by the emergency department and case reviewed with Dr. Excell Seltzer who elected to take her emergently to the Cath Lab for LHC.  Prior to transport she was loaded with Plavix 600 mg.  It was communicated that she did have her last dose of Eliquis last night at 6 PM. Of note she is being worked up for TAVR and was recently discussed at heart team.    Hospital Course     Consultants: VVS   CAD Inferior STEMI -- She was taken emergently to the cath lab 03/09/23 with PCI/DES to SVG-OM. Otherwise stable extensive disease including tandem lesions in the SVG-dRCA of 70% followed by 70%. Recommendations for DAPT with ASA/plavix for total of 2 weeks, then stop ASA.  -- with development of PSA of left radial she will continue ASA for 2 weeks then plan to resume Eliquis  Height:       61.0 in Accession #:    [id number]        Weight:       147.9 lb Date of Birth:  1941/01/28          BSA:          1.662 m Patient Age:    82 years          BP:           130/54 mmHg Patient Gender: F                 HR:           65 bpm. Exam Location:  Inpatient Procedure: 2D Echo, Cardiac Doppler and Color Doppler Indications:    NSTEMI                 Aortic stenosis  History:        Patient has prior history of Echocardiogram examinations, most                 recent 03/02/2022. CAD, Prior CABG, Carotid Disease and Stroke,                 Aortic Valve Disease, Arrythmias:Atrial Fibrillation; Risk                 Factors:Hypertension and Dyslipidemia. Cancer.  Sonographer:    Milda Smart Referring Phys: [id number] JONATHAN SEGARS  Sonographer Comments: Image acquisition challenging due to patient body habitus and Image acquisition challenging due to respiratory motion. IMPRESSIONS  1. Left ventricular ejection fraction, by estimation, is 55 to 60%. The left ventricle has normal function. The left ventricle has no regional wall motion abnormalities. There is mild concentric left  ventricular hypertrophy. Left ventricular diastolic parameters are consistent with Grade II diastolic dysfunction (pseudonormalization).  2. Right ventricular systolic function is low normal. The right ventricular size is normal. There is normal pulmonary artery systolic pressure. The estimated right ventricular systolic pressure is 24.7 mmHg.  3. Left atrial size was mildly dilated.  4. Right atrial size was mildly dilated.  5. The mitral valve is degenerative. Mild to moderate mitral valve regurgitation. No evidence of mitral stenosis. Moderate to severe mitral annular calcification.  6. Tricuspid valve regurgitation is moderate.  7. The aortic valve is tricuspid. There is moderate calcification of the aortic valve. Aortic valve regurgitation is trivial. Suspect severe paradoxical low flow/low gradient aortic valve stenosis. Aortic valve area, by VTI measures 0.79 cm. Aortic valve mean gradient measures 24.0 mmHg.  8. The inferior vena cava is normal in size with greater than 50% respiratory variability, suggesting right atrial pressure of 3 mmHg. FINDINGS  Left Ventricle: Left ventricular ejection fraction, by estimation, is 55 to 60%. The left ventricle has normal function. The left ventricle has no regional wall motion abnormalities. The left ventricular internal cavity size was normal in size. There is  mild concentric left ventricular hypertrophy. Left ventricular diastolic parameters are consistent with Grade II diastolic dysfunction (pseudonormalization). Right Ventricle: The right ventricular size is normal. No increase in right ventricular wall thickness. Right ventricular systolic function is low normal. There is normal pulmonary artery systolic pressure. The tricuspid regurgitant velocity is 2.33 m/s,  and with an assumed right atrial pressure of 3 mmHg, the estimated right ventricular systolic pressure is 24.7 mmHg. Left Atrium: Left atrial size was mildly dilated. Right Atrium: Right atrial size was  mildly dilated. Pericardium: There is no evidence of pericardial  Discharge Summary    Patient ID: TECORA EUSTACHE MRN: [id number]; DOB: December 19, 1940  Admit date: 03/09/2023 Discharge date: 03/15/2023  PCP:  [name], MD   [facility] Providers Cardiologist:  Chrystie Nose, MD     Discharge Diagnoses    Principal Problem:   STEMI involving saphenous vein graft to left circumflex coronary artery [facility]) Active Problems:   Essential hypertension   S/P CABG x 3   Familial hypercholesteremia   Endometrial cancer (HCC)   Goals of care, counseling/discussion   IDA (iron deficiency anemia)   Lower extremity edema   Paroxysmal atrial fibrillation (HCC)   Recent non-STEMI   Severe aortic stenosis   Coronary artery disease involving native coronary artery of native heart with angina pectoris (HCC)   Coronary artery disease involving coronary bypass graft of native heart with angina pectoris (HCC)   Pseudoaneurysm following procedure (HCC) -R Radial Artery   Statin intolerance    Diagnostic Studies/Procedures    Cath: 03/09/2023  1.  Known severe native vessel coronary artery disease, native coronary arteries not selectively imaged this procedure 2.  Known patency of the LIMA to LAD, this graft not selectively imaged this procedure 3.  Severe diffuse SVG to RCA disease, medical therapy recommended 4.  Acute total occlusion of a severely diseased saphenous vein graft to OM, treated successfully with primary PCI using a 3.0 x 12 mm Synergy DES   Recommendations: Okay to resume apixaban tomorrow morning, would use aspirin 81 mg daily x 2 weeks and this patient at high bleeding risk, clopidogrel 75 mg daily x 6 months minimum favor long-term if tolerated (loaded with 600 mg in the emergency room) _____________   History of Present Illness     Shannon Scott is a 82 y.o. female with hx of 3V CABG LIMA-LAD, SVG-OM, SVG-RCA, severe aortic stenosis, locally advanced endometrial cancer status post chemoradiation,  immunotherapy, hysterectomy BSO, colonic mass with partial colectomy and colostomy, prior DVT, A-fib on apixaban, hypertension, hyperlipidemia, statin intolerance, lymphedema, asthma, hypothyroidism  who was seen on 03/09/2023 for the evaluation of inferior STEMI.   Ms. Shoun presented from home with substernal chest pain 5-6 in intensity now. ECG shows inferior STEMI with STE in lead 3 more than lead 2 and STD in lateral leads with subtle STE in V6. She did just have a LHC for NSTEMI with Dr. Jacinto Halim and because of comorbidities, graft anatomy, medical management was recommended.  Unfortunately she presents with sudden onset substernal chest pain since 6 PM that was severe and unrelenting.  She was clutching her chest.  She was hemodynamically stable.  STEMI team was activated by the emergency department and case reviewed with Dr. Excell Seltzer who elected to take her emergently to the Cath Lab for LHC.  Prior to transport she was loaded with Plavix 600 mg.  It was communicated that she did have her last dose of Eliquis last night at 6 PM. Of note she is being worked up for TAVR and was recently discussed at heart team.    Hospital Course     Consultants: [facility]   CAD Inferior STEMI -- She was taken emergently to the cath lab 03/09/23 with PCI/DES to SVG-OM. Otherwise stable extensive disease including tandem lesions in the SVG-dRCA of 70% followed by 70%. Recommendations for DAPT with ASA/plavix for total of 2 weeks, then stop ASA.  -- with development of PSA of left radial she will continue ASA for 2 weeks then plan to resume Eliquis

## 2023-03-16 NOTE — Progress Notes (Deleted)
Cardiology Office Note:    Date:  03/16/2023   ID:  Shannon Scott, DOB 02-16-1941, MRN [id number]  PCP:  [name], MD   [facility] Providers Cardiologist:  Chrystie Nose, MD { Click to update primary MD,subspecialty MD or APP then REFRESH:1}    Referring MD: [name],*   No chief complaint on file. ***  History of Present Illness:    Shannon Scott is a 82 y.o. female with a hx of CAD s/p CABG x 3 with LIMA-LAD, SVG-OM, SVG-RCA, severe aortic stenosis, locally advanced endometrial cancer s/p chemoradiation, immunotherapy, hysterectomy with BSO, colonic mass with partial colectomy and colostomy, prior DVT, PAF on eliquis, HTN, HLD, statin intolerance, lymphedema, asthma, hypothyroidism.   She was admitted 03/01/23 with NSTEMI. Given disease, LHC performed and showed diffuse disease in the native vessels as well as both SVG grafts. Medical management was recommended and she was referred to the structural heart team for severe AS.   She presented back  03/09/23 with inferior STEMI ultimately treated wtih PCI/DES-SVG-OM. Otherwise stable extensive disease including tandem lesions in the SVG-dRCA of 70% followed by 70%. Unfortunately she developed radial PSA medically managed by [facility]. PSA found to have spontaneously thrombosed, no intervention.   Recommendations for DAPT with ASA/plavix for a total of 2 weeks then stop ASA and restart eliquis for plavix and eliquis treatment to help reduce bleeding risk.     CAD s/p CABGx 3 03/09/23: PCD/DES-SVG-OM2 - ASA x 2 weeks then stop and resume eliquis - plavix -    Hyperlipidemia with LDL goal < 55 03/09/2023: Cholesterol 173; HDL 48; LDL Cholesterol 111; Triglycerides 70; VLDL 14 Consider lower goal given progression of graft disease - continue repatha, switch zetia to nexlizet while she waits to get into lipid clinic for inclisiren    Chronic diastolic heart failure - echo with preserved EF -  LE edema related to chronic lymphedema - lasix MWF   Severe aortic stenosis - no longer a candidate for TAVR given acute STEMI   Advanced endometrial Ca -- follows with oncology outpatient -- pre TAVR scans did show a "new small solid pulmonary nodule of the left lower lobe measuring 3 mm. Given history of primary malignancy, consider follow-up chest CT in 6 months." This was forwarded to her gyn-onc Dr. 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) 10/11/2017   Postoperative nausea and vomiting 01/02/2016    Past Surgical History:  Procedure Laterality Date   ABDOMINAL HYSTERECTOMY     Carotid Doppler  02/2012   40-59% right int carotid artery stenosis; 60-79% L int carotid stenosis; L carotid bruit   CORONARY ARTERY BYPASS GRAFT  03/12/2004   LIMA to LAD, SVG to circumflex, SVG to PDA   CORONARY/GRAFT ACUTE MI REVASCULARIZATION  N/A 03/09/2023   Procedure: Coronary/Graft Acute MI Revascularization;  Surgeon: Tonny Bollman, MD;  Location: [facility] INVASIVE CV LAB;  Service: Cardiovascular;  Laterality: N/A;   CORONARY/GRAFT ANGIOGRAPHY N/A 03/02/2023   Procedure: CORONARY/GRAFT ANGIOGRAPHY;  Surgeon: Yates Decamp, MD;  Location: [facility] INVASIVE CV LAB;  Service: Cardiovascular;  Laterality: N/A;   CYSTOSCOPY W/ URETERAL STENT PLACEMENT Left 12/06/2017   Procedure: CYSTOSCOPY WITH LEFT  URETERAL STENT EXCHANGE;  Surgeon: Bjorn Pippin, MD;  Location: [facility];  Service: Urology;  Laterality: Left;   CYSTOSCOPY WITH STENT PLACEMENT Bilateral 08/21/2017   Procedure: CYSTOSCOPY WITH STENT PLACEMENT;  Surgeon: Rene Paci, MD;  Location: [facility];  Service: Urology;  Laterality: Bilateral;   IR FLUORO GUIDE PORT INSERTION RIGHT  10/11/2017   IR GENERIC HISTORICAL  04/29/2016   IR RADIOLOGIST EVAL & MGMT 04/29/2016 Simonne Come, MD [facility] INTERV RAD   IR GENERIC HISTORICAL  05/12/2016   IR RADIOLOGIST EVAL & MGMT 05/12/2016 Simonne Come, MD [facility] INTERV RAD   IR IVC FILTER PLMT / S&I Lenise Arena GUID/MOD SED  10/11/2017   IR US GUIDE VASC ACCESS RIGHT  10/11/2017   ROBOTIC ASSISTED TOTAL HYSTERECTOMY WITH BILATERAL SALPINGO OOPHERECTOMY Bilateral 12/30/2015   Procedure: XI ROBOTIC ASSISTED TOTAL HYSTERECTOMY WITH BILATERAL SALPINGO OOPHORECTOMY WITH SENTAL LYMPH NODE BIOPSY;  Surgeon: Cleda Mccreedy, MD;  Location: [facility];  Service: Gynecology;  Laterality: Bilateral;   TONSILLECTOMY     TRANSTHORACIC ECHOCARDIOGRAM  04/2007   EF>55%; mild MR; mild-mod TR; mild pulm HTN; mild calcification of aortiv valve leaflets with mild valvular aortic stenosis   TUBAL LIGATION      Current Medications: No outpatient medications have been marked as taking for the 03/22/23 encounter (Appointment) with Marcelino Duster, PA.     Allergies:   Statins and Ciprofloxacin   Social History   Socioeconomic History   Marital status: Widowed    Spouse name: Not on file   Number of children: 2   Years of education: Not on file   Highest education level: Not on file  Occupational History    Employer: RETIRED  Tobacco Use   Smoking status: Never   Smokeless tobacco: Never  Vaping Use   Vaping status: Never Used  Substance and Sexual Activity   Alcohol use: No   Drug use: No   Sexual activity: Not Currently  Other Topics Concern   Not on file  Social History Narrative   Not on file   Social  Determinants of Health   Financial Resource Strain: Not on file  Food Insecurity: No Food Insecurity (03/14/2023)   Hunger Vital Sign    Worried About Running Out of Food in the Last Year: Never true    Ran Out of Food in the Last Year: Never true  Transportation Needs: No Transportation Needs (03/14/2023)   PRAPARE - Administrator, Civil Service (Medical): No    Lack of Transportation (Non-Medical): No  Physical Activity: Not on file  Stress: Not on file  Social Connections: Not on file     Family History: The patient's ***family history includes Heart attack in her brother; Heart disease in her brother and mother; Hypertension in her mother and sister; Kidney disease in her brother; Stroke in her father and sister.  ROS:   Please see the history of present illness.    *** All other systems reviewed and are negative.  EKGs/Labs/Other Studies Reviewed:    The following studies were reviewed today:  Left heart cath:  03/09/2023   1.  Known severe native vessel coronary artery disease, native coronary arteries not selectively imaged this procedure 2.  Known patency of the LIMA to LAD, this graft not selectively imaged this procedure 3.  Severe diffuse SVG to RCA disease, medical therapy recommended 4.  Acute total occlusion of a severely diseased saphenous vein graft to OM, treated successfully with primary PCI using a 3.0 x 12 mm Synergy DES   Recommendations: Okay to resume apixaban tomorrow morning, would use aspirin 81 mg daily x 2 weeks and this patient at high bleeding risk, clopidogrel 75 mg daily x 6 months minimum favor long-term if tolerated (loaded with 600 mg in the emergency room) _____________        Recent Labs: 03/09/2023: ALT 8 03/11/2023: Magnesium 1.8 03/14/2023: Hemoglobin 8.7; Platelets 297 03/15/2023: BUN 8; Creatinine, Ser 0.88; Potassium 3.7; Sodium 136  Recent Lipid Panel    Component Value Date/Time   CHOL 173 03/09/2023 0441   CHOL  224 (H) 11/23/2021 1547   CHOL 333 (H) 08/02/2016 1519   TRIG 70 03/09/2023 0441   TRIG 87 08/02/2016 1519   HDL 48 03/09/2023 0441   HDL 57 11/23/2021 1547   HDL 56 08/02/2016 1519   CHOLHDL 3.6 03/09/2023 0441   VLDL 14 03/09/2023 0441   LDLCALC 111 (H) 03/09/2023 0441   LDLCALC 151 (H) 11/23/2021 1547   LDLCALC 257 (H) 08/02/2016 1519     Risk Assessment/Calculations:   {Does this patient have ATRIAL FIBRILLATION?:[phone]}  No BP recorded.  {Refresh Note OR Click here to enter BP  :1}***         Physical Exam:    VS:  There were no vitals taken for this visit.    Wt Readings from Last 3 Encounters:  03/11/23 147 lb 6.4 oz (66.9 kg)  03/04/23 146 lb 9.7 oz (66.5 kg)  03/01/23 153 lb (69.4 kg)     GEN: *** Well nourished, well developed in no acute distress HEENT: Normal NECK: No JVD; No carotid bruits LYMPHATICS: No lymphadenopathy CARDIAC: ***RRR, no murmurs, rubs, gallops RESPIRATORY:  Clear to auscultation without rales, wheezing or rhonchi  ABDOMEN: Soft, non-tender, non-distended MUSCULOSKELETAL:  No edema; No deformity  SKIN: Warm and dry NEUROLOGIC:  Alert and oriented x 3 PSYCHIATRIC:  Normal affect   ASSESSMENT:    No diagnosis found. PLAN:    In order of problems listed above:  ***      {Are you ordering a CV Procedure (e.g. stress test, cath, DCCV, TEE, etc)?   Press F2        :[id number]}    Medication Adjustments/Labs and Tests Ordered: Current medicines are reviewed at length with the patient today.  Concerns regarding medicines are outlined above.  No orders of the defined types were placed in this encounter.  No orders of the defined types were placed in this encounter.   There are no Patient Instructions on file for this visit.   Signed, Marcelino Duster, PA  03/16/2023 2:29 PM    [facility]

## 2023-03-22 ENCOUNTER — Ambulatory Visit: Payer: Medicare Other | Admitting: Physician Assistant

## 2023-03-22 DIAGNOSIS — R10812 Left upper quadrant abdominal tenderness: Secondary | ICD-10-CM | POA: Diagnosis not present

## 2023-03-22 DIAGNOSIS — I2510 Atherosclerotic heart disease of native coronary artery without angina pectoris: Secondary | ICD-10-CM | POA: Diagnosis not present

## 2023-03-25 ENCOUNTER — Ambulatory Visit: Payer: Medicare Other | Attending: Physician Assistant | Admitting: Emergency Medicine

## 2023-03-25 ENCOUNTER — Encounter: Payer: Self-pay | Admitting: Nurse Practitioner

## 2023-03-25 VITALS — BP 124/50 | HR 71 | Ht 61.0 in | Wt 144.0 lb

## 2023-03-25 DIAGNOSIS — I25709 Atherosclerosis of coronary artery bypass graft(s), unspecified, with unspecified angina pectoris: Secondary | ICD-10-CM

## 2023-03-25 DIAGNOSIS — I350 Nonrheumatic aortic (valve) stenosis: Secondary | ICD-10-CM

## 2023-03-25 DIAGNOSIS — E785 Hyperlipidemia, unspecified: Secondary | ICD-10-CM | POA: Diagnosis not present

## 2023-03-25 DIAGNOSIS — I5032 Chronic diastolic (congestive) heart failure: Secondary | ICD-10-CM | POA: Diagnosis not present

## 2023-03-25 DIAGNOSIS — I480 Paroxysmal atrial fibrillation: Secondary | ICD-10-CM | POA: Diagnosis not present

## 2023-03-25 DIAGNOSIS — C541 Malignant neoplasm of endometrium: Secondary | ICD-10-CM

## 2023-03-25 MED ORDER — DILTIAZEM HCL ER COATED BEADS 300 MG PO CP24
300.0000 mg | ORAL_CAPSULE | Freq: Every day | ORAL | 3 refills | Status: DC
  Filled 2023-10-11: qty 90, 90d supply, fill #0
  Filled 2024-02-20: qty 90, 90d supply, fill #1

## 2023-03-25 NOTE — Progress Notes (Signed)
Office Visit    Patient Name: Shannon Scott Date of Encounter: 03/25/2023  Primary Care Provider:  [name], MD Primary Cardiologist:  Chrystie Nose, MD  Chief Complaint    82 year old female with a history including CAD s/p CABG x 3 with LIMA-LAD, SVG-OM, SVG-RCA s/p DES-SVG-OM 2 in 03/2023, severe aortic stenosis, locally advanced endometrial cancer s/p chemoradiation, immunotherapy, hysterectomy with BSO, colonic mass with partial colectomy and colostomy, prior DVT, PAF on eliquis, carotid artery stenosis, CVA, HTN, HLD, statin intolerance, lymphedema, asthma, hypothyroidism who presents for hospital follow-up related to CAD.   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) 10/11/2017   Postoperative nausea and vomiting 01/02/2016   Past Surgical History:  Procedure Laterality Date   ABDOMINAL HYSTERECTOMY     Carotid Doppler  02/2012   40-59% right int carotid artery stenosis; 60-79% L int carotid stenosis; L carotid bruit   CORONARY ARTERY BYPASS GRAFT  03/12/2004   LIMA to LAD, SVG to circumflex, SVG to PDA   CORONARY/GRAFT ACUTE MI REVASCULARIZATION N/A 03/09/2023   Procedure: Coronary/Graft Acute MI Revascularization;  Surgeon: Tonny Bollman, MD;  Location: [facility] INVASIVE CV LAB;  Service: Cardiovascular;  Laterality: N/A;   CORONARY/GRAFT  ANGIOGRAPHY N/A 03/02/2023   Procedure: CORONARY/GRAFT ANGIOGRAPHY;  Surgeon: Yates Decamp, MD;  Location: [facility] INVASIVE CV LAB;  Service: Cardiovascular;  Laterality: N/A;   CYSTOSCOPY W/ URETERAL STENT PLACEMENT Left 12/06/2017   Procedure: CYSTOSCOPY WITH LEFT URETERAL STENT EXCHANGE;  Surgeon: Bjorn Pippin, MD;  Location: [facility];  Service: Urology;  Laterality: Left;   CYSTOSCOPY WITH STENT PLACEMENT Bilateral 08/21/2017   Procedure: CYSTOSCOPY WITH STENT PLACEMENT;  Surgeon: Rene Paci, MD;  Location: [facility];  Service: Urology;  Laterality: Bilateral;   IR FLUORO GUIDE PORT INSERTION RIGHT  10/11/2017   IR GENERIC HISTORICAL  04/29/2016   IR RADIOLOGIST EVAL & MGMT 04/29/2016 Simonne Come, MD [facility] INTERV RAD   IR GENERIC HISTORICAL  05/12/2016   IR RADIOLOGIST EVAL & MGMT 05/12/2016 Simonne Come, MD [facility] INTERV RAD   IR IVC FILTER PLMT / S&I Lenise Arena GUID/MOD SED  10/11/2017   IR US GUIDE VASC ACCESS RIGHT  10/11/2017   ROBOTIC ASSISTED TOTAL HYSTERECTOMY WITH BILATERAL SALPINGO OOPHERECTOMY Bilateral 12/30/2015   Procedure: XI ROBOTIC ASSISTED TOTAL HYSTERECTOMY WITH BILATERAL SALPINGO OOPHORECTOMY WITH SENTAL LYMPH NODE BIOPSY;  Surgeon: Cleda Mccreedy, MD;  Location: [facility];  Service: Gynecology;  Laterality: Bilateral;   TONSILLECTOMY     TRANSTHORACIC ECHOCARDIOGRAM  04/2007   EF>55%; mild MR; mild-mod TR; mild pulm HTN; mild calcification of aortiv valve leaflets with mild valvular aortic stenosis   TUBAL LIGATION      Allergies  Allergies  Allergen Reactions   Statins Other (See Comments)    Myalgias and memory problems  Ciprofloxacin Itching and Other (See Comments)    Splotchy redness with itching during IV infusion localized to arm. Other reaction(s): itching/swelling/redness at injection site     Labs/Other Studies Reviewed    The following studies were reviewed today:  Cardiac Studies & Procedures   CARDIAC CATHETERIZATION  CARDIAC CATHETERIZATION  03/09/2023  Narrative 1.  Known severe native vessel coronary artery disease, native coronary arteries not selectively imaged this procedure 2.  Known patency of the LIMA to LAD, this graft not selectively imaged this procedure 3.  Severe diffuse SVG to RCA disease, medical therapy recommended 4.  Acute total occlusion of a severely diseased saphenous vein graft to OM, treated successfully with primary PCI using a 3.0 x 12 mm Synergy DES  Recommendations: Okay to resume apixaban tomorrow morning, would use aspirin 81 mg daily x 2 weeks and this patient at high bleeding risk, clopidogrel 75 mg daily x 6 months minimum favor long-term if tolerated (loaded with 600 mg in the emergency room)  Findings Coronary Findings Diagnostic  Dominance: Right  Left Main Not selectively imaged Ost LM to Prox LAD lesion is 60% stenosed.  Left Anterior Descending Mid LAD lesion is 95% stenosed.  Left Circumflex Mid Cx lesion is 100% stenosed.  Right Coronary Artery Not selectively imaged Prox RCA to Mid RCA lesion is 100% stenosed.  Graft To 2nd Mrg Prox Graft lesion is 100% stenosed. Vessel is the culprit lesion. The lesion is type C. Non-stenotic Prox Graft to Dist Graft lesion.  LIMA Graft To Dist LAD Not selectively imaged this procedure  Graft To Dist RCA This graft is selectively imaged.  It is unchanged from the previous study last week. Prox Graft lesion is 70% stenosed. Mid Graft lesion is 70% stenosed.  Intervention  Prox Graft lesion (Graft To 2nd Mrg) Stent CATH LAUNCHER 6FR AL.75 guide catheter was inserted. Lesion crossed with guidewire using a WIRE RUNTHROUGH .[id number]. Pre-stent angioplasty was performed using a BALLN EMERGE MR 2.5X12. A drug-eluting stent was successfully placed using a SYNERGY XD 3.0X12. Post-Intervention Lesion Assessment The intervention was successful. Pre-interventional TIMI flow is 0. Post-intervention TIMI flow is 3. No complications occurred at  this lesion. There is a 0% residual stenosis post intervention.   CARDIAC CATHETERIZATION  CARDIAC CATHETERIZATION 03/02/2023  Narrative Images from the original result were not included. Left Heart Catheterization 03/02/23: Aortic valve was not crossed hence LV EDP not evaluated.  Angiographic data: LM: Diffusely diseased with diffuse 40 to 60% stenosis and continues to the same size into the LAD. LAD: Proximal LAD has a 50 to 60% stenosis gives origin to a relatively large D1 which is again diffusely diseased but without any focal high-grade stenosis.  After the origin of D1, LAD has a focal 95% stenosis.  Competitive filling from the LIMA noted. LCx: Large vessel, proximal vessel is diffusely diseased and mildly ectatic.  It is occluded in the midsegment after origin of a very small OM1.  There is a very large OM 2 with secondary branch supplied by saphenous vein graft. RCA: Dominant, occluded in the midsegment.  Diffusely diseased proximally.  LIMA to LAD: Widely patent. SVG to RCA: Patent with tandem 70% stenosis, mild ectasia noted in the midsegment of the body of the saphenous vein graft.  TIMI-3 flow is evident. 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  The aneurysms extend throughout the mid and distal segment of the graft probably aneurysmal length is about 60 to 80 mm.   Recommendation: Patient is very frail 82 year old patient with severe anemia, would recommend outpatient GI evaluation to exclude any obvious GI source of bleeding.  Continue Eliquis for now and also consider adding 81 mg of aspirin already on Plavix only if cleared by GI.  I will discuss with my interventional cardiology colleagues regarding management options but given her frail nature, severe anemia, long aneurysmal segment and risk involving the SVG to OM 2  which is probably the culprit for her NSTEMI will be very difficult to treat with covered stents requiring long-term DAPT and attendant risk of bleeding complications.  Probably medical therapy for now and can be discharged with outpatient follow-up.  Findings Coronary Findings Diagnostic  Dominance: Right  Left Main Ost LM to Prox LAD lesion is 60% stenosed.  Left Anterior Descending Mid LAD lesion is 95% stenosed.  Left Circumflex Mid Cx lesion is 100% stenosed.  Right Coronary Artery Prox RCA to Mid RCA lesion is 100% stenosed.  Graft To 2nd Mrg Prox Graft lesion is 95% stenosed. Non-stenotic Prox Graft to Dist Graft lesion.  LIMA Graft To Dist LAD  Graft To Dist RCA Prox Graft lesion is 70% stenosed. Mid Graft lesion is 70% stenosed.  Intervention  No interventions have been documented.     ECHOCARDIOGRAM  ECHOCARDIOGRAM LIMITED 03/09/2023  Narrative ECHOCARDIOGRAM LIMITED REPORT    Patient Name:   Shannon Scott Date of Exam: 03/09/2023 Medical Rec #:  [id number]         Height:       61.0 in Accession #:    [id number]        Weight:       149.0 lb Date of Birth:  03-29-1941          BSA:          1.667 m Patient Age:    82 years          BP:           126/48 mmHg Patient Gender: F                 HR:           68 bpm. Exam Location:  Inpatient  Procedure: Limited Color Doppler and Cardiac Doppler  Indications:     acute ischemic heart disease  History:         Patient has prior history of Echocardiogram examinations, most recent 03/03/2023. Prior CABG, Aortic Valve Disease; Risk Factors:Hypertension and Dyslipidemia.  Sonographer:     Delcie Roch RDCS Referring Phys:  [phone] MICHAEL COOPER Diagnosing Phys: Jodelle Red MD  IMPRESSIONS   1. Left ventricular ejection fraction, by estimation, is 55 to 60%. The left ventricle has normal function. The left ventricle demonstrates regional wall motion abnormalities (see scoring  diagram/findings for description). There is mild hypokinesis of the left ventricular, basal-mid anterolateral wall. 2. The mitral valve is degenerative. Moderate mitral valve regurgitation. No evidence of mitral stenosis. Severe mitral annular calcification. 3. Tricuspid valve regurgitation is mild to moderate. 4. The aortic valve is calcified. There is moderate calcification of the aortic valve. There is moderate thickening of the aortic valve. Aortic valve regurgitation is trivial. Moderate aortic valve stenosis. 5. There is mildly elevated pulmonary artery systolic pressure. 6. The inferior vena cava is normal in size with greater than 50% respiratory variability, suggesting right atrial pressure  of 3 mmHg.  Comparison(s): Changes from prior study are noted. Anterolateral wall motion abnormality appears new.  FINDINGS Left Ventricle: Left ventricular ejection fraction, by estimation, is 55 to 60%. The left ventricle has normal function. The left ventricle demonstrates regional wall motion abnormalities. Mild hypokinesis of the left ventricular, basal-mid anterolateral wall.  Right Ventricle: There is mildly elevated pulmonary artery systolic pressure. The tricuspid regurgitant velocity is 2.95 m/s, and with an assumed right atrial pressure of 3 mmHg, the estimated right ventricular systolic pressure is 37.8 mmHg.  Mitral Valve: The mitral valve is degenerative in appearance. There is mild thickening of the mitral valve leaflet(s). There is mild calcification of the mitral valve leaflet(s). Severe mitral annular calcification. Moderate mitral valve regurgitation. No evidence of mitral valve stenosis.  Tricuspid Valve: The tricuspid valve is normal in structure. Tricuspid valve regurgitation is mild to moderate. No evidence of tricuspid stenosis.  Aortic Valve: Cannot exclude low flow low gradient severe AS. The aortic valve is calcified. There is moderate calcification of the aortic valve.  There is moderate thickening of the aortic valve. Aortic valve regurgitation is trivial. Moderate aortic stenosis is present. Aortic valve mean gradient measures 20.0 mmHg. Aortic valve peak gradient measures 33.9 mmHg. Aortic valve area, by VTI measures 0.65 cm.  Pulmonic Valve: The pulmonic valve was not well visualized. Pulmonic valve regurgitation is not visualized.  Venous: The inferior vena cava is normal in size with greater than 50% respiratory variability, suggesting right atrial pressure of 3 mmHg.  LEFT VENTRICLE PLAX 2D LVIDd:         4.30 cm LVIDs:         2.90 cm LV PW:         1.00 cm LV IVS:        0.70 cm LVOT diam:     1.60 cm LV SV:         49 LV SV Index:   29 LVOT Area:     2.01 cm   LEFT ATRIUM         Index LA diam:    4.50 cm 2.70 cm/m AORTIC VALVE AV Area (Vmax):    0.63 cm AV Area (Vmean):   0.60 cm AV Area (VTI):     0.65 cm AV Vmax:           291.00 cm/s AV Vmean:          210.000 cm/s AV VTI:            0.745 m AV Peak Grad:      33.9 mmHg AV Mean Grad:      20.0 mmHg LVOT Vmax:         91.75 cm/s LVOT Vmean:        62.700 cm/s LVOT VTI:          0.242 m LVOT/AV VTI ratio: 0.32  MITRAL VALVE                  TRICUSPID VALVE MV Area (PHT): 3.34 cm       TR Peak grad:   34.8 mmHg MV Decel Time: 227 msec       TR Vmax:        295.00 cm/s MR Peak grad:    137.4 mmHg MR Mean grad:    88.0 mmHg    SHUNTS MR Vmax:         586.00 cm/s  Systemic VTI:  0.24 m MR Vmean:  430.0 cm/s   Systemic Diam: 1.60 cm MR PISA:         1.01 cm MR PISA Eff ROA: 7 mm MR PISA Radius:  0.40 cm MV E velocity: 154.00 cm/s MV A velocity: 48.00 cm/s MV E/A ratio:  3.21  Jodelle Red MD Electronically signed by Jodelle Red MD Signature Date/Time: 03/09/2023/3:22:42 PM    Final (Updated)     CT SCANS  CT CORONARY MORPH W/CTA COR W/SCORE 03/04/2023  Addendum 03/04/2023  5:42 PM ADDENDUM REPORT: 03/04/2023 17:40  CLINICAL DATA:   84 -year-old female with severe aortic stenosis being evaluated for a TAVR procedure.  EXAM: Cardiac TAVR CT  TECHNIQUE: The patient was scanned on a [employer]. A 120 kV retrospective scan was triggered in the descending thoracic aorta at 111 HU's. Gantry rotation speed was 250 msecs and collimation was .6 mm. No beta blockade or nitro were given. The 3D data set was reconstructed in 5% intervals of the R-R cycle. Systolic and diastolic phases were analyzed on a dedicated work station using MPR, MIP and VRT modes. The patient received 100 cc of contrast.  FINDINGS: Aortic Root:  Aortic valve: Tricuspid  Aortic valve calcium score: 2166  Aortic annulus:  Diameter: 25mm x 17mm  Perimeter: 20mm  Area: 309 mm^2  Calcifications: No calcifications  Coronary height: Min Left - 13mm; Min Right - 15mm  Sinotubular height: Left cusp - 18mm; Right cusp - 18mm; Noncoronary cusp - 25mm  LVOT (as measured 3 mm below the annulus):  Diameter: 25mm x 14mm  Area: 252mm^2  Calcifications: No calcifications  Aortic sinus width: Left cusp - 27mm; Right cusp - 27mm; Noncoronary cusp - 29mm  Sinotubular junction width: 25mm x 23mm  Optimum Fluoroscopic Angle for Delivery: RAO 8 CAU 1  Cardiac:  Right atrium: Mild enlargement  Right ventricle: Mild dilatation  Pulmonary arteries: Normal size  Pulmonary veins: Normal configuration  Left atrium: Mild enlargement  Left ventricle: Normal size  Pericardium: Normal thickness  Coronary arteries: Normal origin. Patent LIMA-LAD and SVG-RCA. As described on cath on 03/02/23, SVG-OM is patent but appears severe stenosis in graft, and graft is aneurysmal following stenosis  Valves: Moderate MAC  IMPRESSION: 1. Tricuspid aortic valve with severe calcifications (AV calcium score 2166)  2. Small aortic annulus measures 25mm x 17mm in diameter with perimeter 65 mm and area 309 mm^2. No annular or  LVOT calcifications. Annular measurements are on the border between a 23mm and 26mm Evolut valve, though sinus of Valsalva diameter is borderline for a 26mm Evolut (diameter 27mm, which is right at the cutoff). Alternatively, annular measurements would be suitable for 20mm Sapien 3 valve. Recommend discussion at structural heart conference.  3.  Sufficient coronary to annulus distance.  4.  Optimum Fluoroscopic Angle for Delivery:  RAO 8 CAU 1  4. Patent LIMA-LAD and SVG-RCA. As described on cath on 03/02/23, SVG-OM is patent but appears severe stenosis in graft, and graft is aneurysmal following stenosis   Electronically Signed By: Epifanio Lesches M.D. On: 03/04/2023 17:40  Narrative EXAM: OVER-READ INTERPRETATION  CT CHEST  The following report is a limited chest CT over-read performed by radiologist Dr. Allegra Lai of [facility] on 03/04/2023. This over-read does not include interpretation of cardiac or coronary anatomy or pathology. The cardiac TAVR interpretation by the cardiologist is attached.  COMPARISON:  None Available.  FINDINGS: Extracardiac findings will be described separately under dictation for contemporaneously obtained CTA chest, abdomen and pelvis.  IMPRESSION: Please see separate dictation for contemporaneously obtained CTA chest, abdomen and pelvis dated 03/04/2023 for full description of relevant extracardiac findings.  Electronically Signed: By: Allegra Lai M.D. On: 03/04/2023 15:59         Recent Labs: 03/09/2023: ALT 8 03/11/2023: Magnesium 1.8 03/14/2023: Hemoglobin 8.7; Platelets 297 03/15/2023: BUN 8; Creatinine, Ser 0.88; Potassium 3.7; Sodium 136  Recent Lipid Panel    Component Value Date/Time   CHOL 173 03/09/2023 0441   CHOL 224 (H) 11/23/2021 1547   CHOL 333 (H) 08/02/2016 1519   TRIG 70 03/09/2023 0441   TRIG 87 08/02/2016 1519   HDL 48 03/09/2023 0441   HDL 57 11/23/2021 1547   HDL 56  08/02/2016 1519   CHOLHDL 3.6 03/09/2023 0441   VLDL 14 03/09/2023 0441   LDLCALC 111 (H) 03/09/2023 0441   LDLCALC 151 (H) 11/23/2021 1547   LDLCALC 257 (H) 08/02/2016 1519    History of Present Illness    82 year old female with the above past medical history including CAD s/p CABG x 3 with LIMA-LAD, SVG-OM, SVG-RCA, s/p DES-SVG-OM 2 in 03/2023, severe aortic stenosis, locally advanced endometrial cancer s/p chemoradiation, immunotherapy, hysterectomy with BSO, colonic mass with partial colectomy and colostomy, prior DVT, PAF on eliquis, carotid artery stenosis, CVA, HTN, HLD, statin intolerance, lymphedema, asthma, hypothyroidism.   She s/p CABG x 3 in 2005.  Stress test in 2013 was negative for ischemia.  She was hospitalized in October 2021 in the setting of acute left MCA infarct.  She was last seen in the office on 05/03/2022 and was stable from a cardiac standpoint.  She was admitted 03/01/23 with NSTEMI. Given disease, LHC performed and showed diffuse disease in the native vessels as well as both SVG grafts. Medical management was recommended and she was referred to the structural heart team for severe AS. She returned to the ED on 03/09/23 with inferior STEMI ultimately treated wtih PCI/DES-SVG-OM. Otherwise stable extensive disease including tandem lesions in the SVG-dRCA of 70% followed by 70%.  Echocardiogram showed EF 55 to 60%, RWMA, mild hypokinesis of the left ventricular, basal mid anterolateral wall, moderate mitral valve regurgitation, moderate aortic stenosis.  Unfortunately she developed radial PSA, this was medically managed by [facility]. PSA found to have spontaneously thrombosed, no intervention but with recommendations for DAPT with ASA/plavix for a total of 2 weeks followed by Plavix and Eliquis.  She presents today for follow-up.  She is with her son and daughter today.  Since her hospitalization she has been doing well.  She denies any chest pain, leg swelling, syncope.  She notes  that she became short of breath in the hospital after she had started carvedilol.  She noted that she continued to be short of breath at home, it was worse in the mornings.  She self stopped carvedilol and shortness of breath spontaneously resolved.  She increased her diltiazem from 180 mg back to 300 mg, which is what she was on prior to her admission.  She tells me that she has not been able to eat due to cracked/broken and painful teeth.  She says that she has been unable to eat due to the pain and has been drinking protein shakes, and unintentionally losing weight.  She would like to know if she can have her teeth pulled. She notes that she is back to her baseline at home where she is able to vacuum, cook, clean and does not see the point in going to cardiac rehab.  She  denies chest pain, lower extremity edema, fatigue, palpitations, melena, hematuria, hemoptysis, diaphoresis, weakness, presyncope, syncope, orthopnea, and PND.   Home Medications    Current Outpatient Medications  Medication Sig Dispense Refill   aspirin 81 MG chewable tablet Chew 1 tablet (81 mg total) by mouth daily. 14 tablet 0   carvedilol (COREG) 3.125 MG tablet Take 1 tablet (3.125 mg total) by mouth 2 (two) times daily with a meal. 180 tablet 0   clobetasol ointment (TEMOVATE) 0.05 % Apply 1 Application topically 2 (two) times a week. 30 g 3   clopidogrel (PLAVIX) 75 MG tablet Take 1 tablet (75 mg total) by mouth daily with breakfast. 90 tablet 2   diltiazem (CARDIZEM CD) 300 MG 24 hr capsule Take 1 capsule (300 mg total) by mouth daily. 90 capsule 3   ezetimibe (ZETIA) 10 MG tablet Take 10 mg by mouth daily.     levothyroxine (SYNTHROID, LEVOTHROID) 50 MCG tablet Take 1 tablet (50 mcg total) by mouth daily before breakfast. 30 tablet 1   mometasone (ELOCON) 0.1 % cream Apply 1 Application topically daily as needed (to the vulva for irritation and itching). 45 g 0   nitroGLYCERIN (NITROSTAT) 0.4 MG SL tablet Place 1 tablet  (0.4 mg total) under the tongue every 5 (five) minutes as needed for chest pain. 25 tablet 0   polyethylene glycol (MIRALAX / GLYCOLAX) 17 g packet Take 17 g by mouth daily as needed for severe constipation. (Patient taking differently: Take 17 g by mouth daily.) 14 each 0   REPATHA SURECLICK 140 MG/ML SOAJ INJECT 1 DOSE UNDER THE SKIN EVERY 14 DAYS 6 mL 3   traMADol (ULTRAM) 50 MG tablet TAKE 2 TABLETS(100 MG) BY MOUTH TWICE DAILY 120 tablet 0   [START ON 03/29/2023] apixaban (ELIQUIS) 5 MG TABS tablet Take 1 tablet (5 mg total) by mouth 2 (two) times daily. (Patient not taking: Reported on 03/25/2023) 60 tablet 0   furosemide (LASIX) 20 MG tablet Take one tablet daily on Monday, Wednesday and Friday (Patient not taking: Reported on 03/25/2023) 30 tablet 1   No current facility-administered medications for this visit.     Review of Systems  All other systems reviewed and are otherwise negative except as noted above.    Physical Exam    VS:  BP (!) 124/50 (BP Location: Right Arm, Patient Position: Sitting, Cuff Size: Normal)   Pulse 71   Ht 5\' 1"  (1.549 m)   Wt 144 lb (65.3 kg)   BMI 27.21 kg/m  , BMI Body mass index is 27.21 kg/m.     GEN: Well nourished, well developed, in no acute distress. HEENT: normal. Neck: Supple, no JVD, carotid bruits, or masses. Cardiac: RRR, no murmurs, rubs, or gallops. No clubbing, cyanosis, edema.  Radials/DP/PT 2+ and equal bilaterally.  Respiratory:  Respirations regular and unlabored, clear to auscultation bilaterally. GI: Soft, nontender, nondistended, BS + x 4. MS: no deformity or atrophy. Skin: warm and dry, no rash. Neuro:  Strength and sensation are intact. Psych: Normal affect.  Accessory Clinical Findings    ECG personally reviewed by me today - EKG Interpretation Date/Time:  Friday March 25 2023 14:11:59 EDT Ventricular Rate:  71 PR Interval:  176 QRS Duration:  88 QT Interval:  398 QTC Calculation: 432 R Axis:   20  Text  Interpretation: Normal sinus rhythm ST & T wave abnormality, consider inferolateral ischemia Confirmed by Rise Paganini [phone]) on 03/25/2023 4:54:18 PM  - no acute changes.  Lab Results  Component Value Date   WBC 4.4 03/14/2023   HGB 8.7 (L) 03/14/2023   HCT 28.0 (L) 03/14/2023   MCV 87.5 03/14/2023   PLT 297 03/14/2023   Lab Results  Component Value Date   CREATININE 0.88 03/15/2023   BUN 8 03/15/2023   NA 136 03/15/2023   K 3.7 03/15/2023   CL 100 03/15/2023   CO2 26 03/15/2023   Lab Results  Component Value Date   ALT 8 03/09/2023   AST 18 03/09/2023   ALKPHOS 74 03/09/2023   BILITOT 0.5 03/09/2023   Lab Results  Component Value Date   CHOL 173 03/09/2023   HDL 48 03/09/2023   LDLCALC 111 (H) 03/09/2023   TRIG 70 03/09/2023   CHOLHDL 3.6 03/09/2023    Lab Results  Component Value Date   HGBA1C 5.3 03/09/2023    Assessment & Plan    CAD s/p CABGx 3 03/09/23: PCD/DES-SVG-OM2 - Continue continue Plavix, discontinue aspirin 10/29 in the setting of chronic DOAC therapy -Continue zetia - No chest pain   Left Radial PSA -No swelling, pain, or discoloration -Appears to be resolved  Hyperlipidemia with LDL goal < 55 03/09/2023: Cholesterol 173; HDL 48; LDL Cholesterol 111; Triglycerides 70; VLDL 14 -continue repatha, ezetimibe -Has been approved for nexlizet, she's concerned about price -Previously referred to lipid clinic for other options  -She did not return calls, she was hesitant to start another lipid lower therapy -Advise f/u with lipid clinic  Chronic diastolic heart failure -echo 10/9 with preserved EF 55-60% -She self-discontinued coreg since d/c home due to acute SOB, her SOB completely resolved after stopping the medication  -Defer initiation of alternate BB therapy at this time -LE edema related to chronic lymphedema -Continue lasix MWF -Continue daily weights, <2g low sodium diet  Severe aortic stenosis -Evaluated by structural heart  team, she was not felt to be a good TAVR candidate at this time given acute STEMI -Euvolemic and well compensated on exam  -Continue lasix MWF  PAF:  -Continues to maintain NSR -She increased her Diltiazem back to 300mg  (Pre-admission dosage) after self-discontinuing coreg while at home  -Diltiazem dose was decreased previously in the setting of bradycardia (HR in 50s) -HR stable at home and in office today -Ok to continue diltiazem at current dose of 300mg  -Continue Eliquis   Advanced endometrial Cancer -follows with oncology outpatient / gyn-onc Dr. Pricilla Holm -small solid pulmonary nodule of the left lower lobe measuring 3 mm. Given history of primary malignancy, consider follow-up chest CT in 6 months. -Per TVAR scans   Disposition: Follow up in 3 months.     Denyce Robert, NP 03/25/2023, 7:28 PM

## 2023-03-25 NOTE — Patient Instructions (Addendum)
Medication Instructions:  Restart Eliquis on March 29, 2023. Stop Asprin on March 29, 2023 Increase Cardizem 300 mg daily  *If you need a refill on your cardiac medications before your next appointment, please call your pharmacy*   Lab Work: NONE ordered at this time of appointment   Testing/Procedures: NONE ordered at this time of appointment    Follow-Up: At [facility], you and your health needs are our priority.  As part of our continuing mission to provide you with exceptional heart care, we have created designated Provider Care Teams.  These Care Teams include your primary Cardiologist (physician) and Advanced Practice Providers (APPs -  Physician Assistants and Nurse Practitioners) who all work together to provide you with the care you need, when you need it.  We recommend signing up for the patient portal called "MyChart".  Sign up information is provided on this After Visit Summary.  MyChart is used to connect with patients for Virtual Visits (Telemedicine).  Patients are able to view lab/test results, encounter notes, upcoming appointments, etc.  Non-urgent messages can be sent to your provider as well.   To learn more about what you can do with MyChart, go to [email or web address].    Your next appointment:   3-4 month(s)  Provider:   Chrystie Nose, MD or Any APP    Other Instructions Please contact the Lipid Clinic.   Reminder, You will not need to hold blood thinners if you have two teeth pulled at a time.

## 2023-03-25 NOTE — Plan of Care (Signed)
 [facility] Tonsillectomy/Adenoidectomy, Postoperative PEDS care plan entered in error.

## 2023-03-28 ENCOUNTER — Telehealth: Payer: Self-pay | Admitting: Internal Medicine

## 2023-03-28 NOTE — Telephone Encounter (Signed)
Pt c/o medication issue:  1. Name of Medication:  clopidogrel (PLAVIX) 75 MG tablet  2. How are you currently taking this medication (dosage and times per day)?  As prescribed  3. Are you having a reaction (difficulty breathing--STAT)?   4. What is your medication issue?   Patient states this medication has been causing SOB, lightheadedness, nausea, and weakness.

## 2023-03-28 NOTE — Telephone Encounter (Signed)
Spoke with patient and she states yesterday she started to feel SOB and nauseated. Today she woke up feeling the same way.  She was sitting in the chair and she felt SOB and nauseated. She states she did eat and feels a little better but still feels shaky. Her BP today was 147/58 hr 72 and O2 98%. She states she is not sure if its just her or the new medications she started from the hospital is making her feel that way. Denies any chest pain or swelling. She would like to know if you think its her medication or something else?

## 2023-03-30 NOTE — Telephone Encounter (Signed)
Left message to call back  

## 2023-03-30 NOTE — Telephone Encounter (Signed)
Shannon Scott, [name], AGNP-C  You2 days ago    There is always a possibility of her meds making her nauseous.  If she continues to be nauseous and/or short of breath she should follow-up in clinic or visit the ED.

## 2023-03-31 ENCOUNTER — Encounter: Payer: Self-pay | Admitting: Pharmacist

## 2023-04-01 ENCOUNTER — Telehealth: Payer: Self-pay | Admitting: Pharmacist

## 2023-04-01 ENCOUNTER — Ambulatory Visit: Payer: Medicare Other

## 2023-04-01 DIAGNOSIS — K921 Melena: Secondary | ICD-10-CM | POA: Insufficient documentation

## 2023-04-01 NOTE — Telephone Encounter (Signed)
Called patient to discuss lipid lowering options. Statin intolerant. Currently on ezetimibe and Repatha. LDL 111. Patinet interested in starting Leqvio. Requests start form be printed and mailed to her and then she will mail back.  Start form placed in mail.

## 2023-04-13 NOTE — Telephone Encounter (Signed)
MyChart message sent as no call back

## 2023-05-13 ENCOUNTER — Ambulatory Visit: Payer: Medicare Other | Admitting: Gynecologic Oncology

## 2023-05-31 ENCOUNTER — Telehealth: Payer: Self-pay | Admitting: *Deleted

## 2023-05-31 NOTE — Telephone Encounter (Signed)
LMOM for the patient's daughter to call the office back. The patient needs to be scheduled for a follow up appt with Dr Tamela Oddi in Jan

## 2023-06-02 ENCOUNTER — Inpatient Hospital Stay: Payer: Medicare Other | Admitting: Medical Oncology

## 2023-06-02 ENCOUNTER — Encounter: Payer: Self-pay | Admitting: Medical Oncology

## 2023-06-02 ENCOUNTER — Telehealth: Payer: Self-pay | Admitting: *Deleted

## 2023-06-02 ENCOUNTER — Inpatient Hospital Stay: Payer: Medicare Other

## 2023-06-02 ENCOUNTER — Inpatient Hospital Stay: Payer: Medicare Other | Attending: Hematology & Oncology

## 2023-06-02 VITALS — BP 158/51 | HR 77 | Temp 98.3°F | Resp 19 | Ht 61.0 in | Wt 145.4 lb

## 2023-06-02 DIAGNOSIS — Z951 Presence of aortocoronary bypass graft: Secondary | ICD-10-CM | POA: Insufficient documentation

## 2023-06-02 DIAGNOSIS — Z95828 Presence of other vascular implants and grafts: Secondary | ICD-10-CM

## 2023-06-02 DIAGNOSIS — I252 Old myocardial infarction: Secondary | ICD-10-CM | POA: Diagnosis not present

## 2023-06-02 DIAGNOSIS — Z923 Personal history of irradiation: Secondary | ICD-10-CM | POA: Insufficient documentation

## 2023-06-02 DIAGNOSIS — C539 Malignant neoplasm of cervix uteri, unspecified: Secondary | ICD-10-CM

## 2023-06-02 DIAGNOSIS — N9089 Other specified noninflammatory disorders of vulva and perineum: Secondary | ICD-10-CM | POA: Diagnosis not present

## 2023-06-02 DIAGNOSIS — Z9221 Personal history of antineoplastic chemotherapy: Secondary | ICD-10-CM | POA: Insufficient documentation

## 2023-06-02 DIAGNOSIS — R600 Localized edema: Secondary | ICD-10-CM | POA: Insufficient documentation

## 2023-06-02 DIAGNOSIS — D508 Other iron deficiency anemias: Secondary | ICD-10-CM

## 2023-06-02 DIAGNOSIS — C541 Malignant neoplasm of endometrium: Secondary | ICD-10-CM | POA: Insufficient documentation

## 2023-06-02 DIAGNOSIS — Z933 Colostomy status: Secondary | ICD-10-CM | POA: Insufficient documentation

## 2023-06-02 DIAGNOSIS — C799 Secondary malignant neoplasm of unspecified site: Secondary | ICD-10-CM | POA: Diagnosis not present

## 2023-06-02 DIAGNOSIS — D62 Acute posthemorrhagic anemia: Secondary | ICD-10-CM

## 2023-06-02 LAB — CBC WITH DIFFERENTIAL (CANCER CENTER ONLY)
Abs Immature Granulocytes: 0.02 10*3/uL (ref 0.00–0.07)
Basophils Absolute: 0 10*3/uL (ref 0.0–0.1)
Basophils Relative: 0 %
Eosinophils Absolute: 0 10*3/uL (ref 0.0–0.5)
Eosinophils Relative: 1 %
HCT: 30.7 % — ABNORMAL LOW (ref 36.0–46.0)
Hemoglobin: 9.7 g/dL — ABNORMAL LOW (ref 12.0–15.0)
Immature Granulocytes: 0 %
Lymphocytes Relative: 11 %
Lymphs Abs: 0.6 10*3/uL — ABNORMAL LOW (ref 0.7–4.0)
MCH: 26.8 pg (ref 26.0–34.0)
MCHC: 31.6 g/dL (ref 30.0–36.0)
MCV: 84.8 fL (ref 80.0–100.0)
Monocytes Absolute: 0.3 10*3/uL (ref 0.1–1.0)
Monocytes Relative: 6 %
Neutro Abs: 4.7 10*3/uL (ref 1.7–7.7)
Neutrophils Relative %: 82 %
Platelet Count: 282 10*3/uL (ref 150–400)
RBC: 3.62 MIL/uL — ABNORMAL LOW (ref 3.87–5.11)
RDW: 15 % (ref 11.5–15.5)
WBC Count: 5.7 10*3/uL (ref 4.0–10.5)
nRBC: 0 % (ref 0.0–0.2)

## 2023-06-02 LAB — FERRITIN: Ferritin: 36 ng/mL (ref 11–307)

## 2023-06-02 LAB — CMP (CANCER CENTER ONLY)
ALT: 6 U/L (ref 0–44)
AST: 12 U/L — ABNORMAL LOW (ref 15–41)
Albumin: 3.8 g/dL (ref 3.5–5.0)
Alkaline Phosphatase: 70 U/L (ref 38–126)
Anion gap: 7 (ref 5–15)
BUN: 14 mg/dL (ref 8–23)
CO2: 29 mmol/L (ref 22–32)
Calcium: 9.5 mg/dL (ref 8.9–10.3)
Chloride: 103 mmol/L (ref 98–111)
Creatinine: 0.78 mg/dL (ref 0.44–1.00)
GFR, Estimated: >60 mL/min (ref >60)
Glucose, Bld: 116 mg/dL — ABNORMAL HIGH (ref 70–99)
Potassium: 4 mmol/L (ref 3.5–5.1)
Sodium: 139 mmol/L (ref 135–145)
Total Bilirubin: 0.3 mg/dL (ref 0.0–1.2)
Total Protein: 6.6 g/dL (ref 6.5–8.1)

## 2023-06-02 LAB — IRON AND IRON BINDING CAPACITY (CC-WL,HP ONLY)
Iron: 27 ug/dL — ABNORMAL LOW (ref 28–170)
Saturation Ratios: 9 % — ABNORMAL LOW (ref 10.4–31.8)
TIBC: 290 ug/dL (ref 250–450)
UIBC: 263 ug/dL (ref 148–442)

## 2023-06-02 LAB — LACTATE DEHYDROGENASE: LDH: 125 U/L (ref 98–192)

## 2023-06-02 MED ORDER — SODIUM CHLORIDE 0.9% FLUSH
10.0000 mL | INTRAVENOUS | Status: DC | PRN
  Administered 2023-06-02: 10 mL via INTRAVENOUS

## 2023-06-02 MED ORDER — HEPARIN SOD (PORK) LOCK FLUSH 100 UNIT/ML IV SOLN
500.0000 [IU] | Freq: Once | INTRAVENOUS | Status: AC
  Administered 2023-06-02: 500 [IU] via INTRAVENOUS

## 2023-06-02 NOTE — Patient Instructions (Signed)

## 2023-06-02 NOTE — Progress Notes (Signed)
 Hematology and Oncology Follow Up Visit  Shannon Scott [id number] 1941-05-29 83 y.o. 06/02/2023   Principle Diagnosis:  Locally advanced/recurrent endometrial carcinoma undifferentiated --  TMB (HIGH) / MSI HIGH  Intermittent iron  deficiency anemia   Past Therapy: Radiation therapy for 5 -5 1/2 weeks - s/p 20 fractions Taxol /carboplatinum q 7 days -- s/p cycle 6    Current Therapy:        Pembrolizumab  400 mg q 12 weeks, s/p cycle 20 (originally started on 01/23/2019 at 400 mg IV q 6 wk)  --on hold for patient request since April 2022 Zometa  4 mg IV q 3 months -- next dose due 01/18/2023 IV iron  as indicated --Venofer  given on 10/08/2022       Interim History:  Shannon Scott is here today for follow-up. At her last visit she had chest pressure and was taken to the ER. She was found to have an NSTEMI. She then underwent a heart cath without stenting but developed an STEMI on 03/09/2023. She was hospitalized from 03/09/2023-03/15/2023 and did undergo CABG x 3.   Today she has been doing much better but unfortunately just received a call that her sister passed away. This was expected but still hard to hear.   She is back on Apixaban , 81 mg asa, and clopidogrel . She reports that she is doing well with this.    She has a colostomy.  She has a large hernia around the colostomy site.  She continues to have peripheral edema. She has worked with her cardiologist, PCP on this and uses a compression device. She asks what else she can do.   When I saw her back in May, her iron  saturation was 10% and ferritin was 53. Given her MI events she has not had any iron  since.   Currently, I would say that her performance status is probably ECOG 1.   Wt Readings from Last 3 Encounters:  06/02/23 145 lb 6.4 oz (66 kg)  03/25/23 144 lb (65.3 kg)  03/11/23 147 lb 6.4 oz (66.9 kg)   Medications:  Allergies as of 06/02/2023       Reactions   Statins Other (See Comments)   Myalgias and memory  problems   Ciprofloxacin  Itching, Swelling, Other (See Comments)   Other reaction(s): itching/swelling/redness at injection site        Medication List        Accurate as of June 02, 2023 12:58 PM. If you have any questions, ask your nurse or doctor.          apixaban  5 MG Tabs tablet Commonly known as: ELIQUIS  Take 1 tablet (5 mg total) by mouth 2 (two) times daily.   aspirin  81 MG chewable tablet Chew 1 tablet (81 mg total) by mouth daily.   carvedilol  3.125 MG tablet Commonly known as: COREG  Take 1 tablet (3.125 mg total) by mouth 2 (two) times daily with a meal.   clobetasol  ointment 0.05 % Commonly known as: TEMOVATE  Apply 1 Application topically 2 (two) times a week.   clopidogrel  75 MG tablet Commonly known as: PLAVIX  Take 1 tablet (75 mg total) by mouth daily with breakfast.   diltiazem  300 MG 24 hr capsule Commonly known as: Cardizem  CD Take 1 capsule (300 mg total) by mouth daily.   ezetimibe  10 MG tablet Commonly known as: ZETIA  Take 10 mg by mouth daily.   furosemide  20 MG tablet Commonly known as: LASIX  Take one tablet daily on Monday, Wednesday and Friday   levothyroxine  50  MCG tablet Commonly known as: SYNTHROID  Take 1 tablet (50 mcg total) by mouth daily before breakfast.   mometasone  0.1 % cream Commonly known as: ELOCON  Apply 1 Application topically daily as needed (to the vulva for irritation and itching).   nitroGLYCERIN  0.4 MG SL tablet Commonly known as: NITROSTAT  Place 1 tablet (0.4 mg total) under the tongue every 5 (five) minutes as needed for chest pain.   polyethylene glycol 17 g packet Commonly known as: MIRALAX  / GLYCOLAX  Take 17 g by mouth daily as needed for severe constipation. What changed: when to take this   Repatha  SureClick 140 MG/ML Soaj Generic drug: Evolocumab  INJECT 1 DOSE UNDER THE SKIN EVERY 14 DAYS   traMADol  50 MG tablet Commonly known as: ULTRAM  TAKE 2 TABLETS(100 MG) BY MOUTH TWICE DAILY         Allergies:  Allergies  Allergen Reactions   Statins Other (See Comments)    Myalgias and memory problems   Ciprofloxacin  Itching, Swelling and Other (See Comments)    Other reaction(s): itching/swelling/redness at injection site    Past Medical History, Surgical history, Social history, and Family History were reviewed and updated.  Review of Systems: Review of Systems  Constitutional: Negative.   HENT: Negative.    Eyes: Negative.   Respiratory: Negative.    Cardiovascular:  Positive for leg swelling.  Gastrointestinal: Negative.   Genitourinary: Negative.   Musculoskeletal:  Positive for back pain.  Skin: Negative.   Neurological: Negative.   Endo/Heme/Allergies: Negative.   Psychiatric/Behavioral: Negative.       Physical Exam:  height is 5' 1 (1.549 m) and weight is 145 lb 6.4 oz (66 kg). Her oral temperature is 98.3 F (36.8 C). Her blood pressure is 158/51 (abnormal) and her pulse is 77. Her respiration is 19 and oxygen saturation is 100%.   Wt Readings from Last 3 Encounters:  06/02/23 145 lb 6.4 oz (66 kg)  03/25/23 144 lb (65.3 kg)  03/11/23 147 lb 6.4 oz (66.9 kg)    Physical Exam Vitals reviewed.  HENT:     Head: Normocephalic and atraumatic.  Eyes:     Pupils: Pupils are equal, round, and reactive to light.  Cardiovascular:     Rate and Rhythm: Normal rate and regular rhythm.     Heart sounds: Normal heart sounds.     Comments: Cardiac exam shows a regular rate and rhythm.  She has a 4/6 systolic ejection murmur.   Pulmonary:     Effort: Pulmonary effort is normal.     Breath sounds: Normal breath sounds.  Abdominal:     General: Bowel sounds are normal.     Palpations: Abdomen is soft.  Musculoskeletal:        General: No tenderness or deformity. Normal range of motion.     Cervical back: Normal range of motion.     Comments: She has significant lymphedema in her legs.  She has probably 3+ in the left leg and 1+ in the right leg. She has  pitting edema.  She has decent range of motion of her joints.  Lymphadenopathy:     Cervical: No cervical adenopathy.  Skin:    General: Skin is warm and dry.     Findings: No erythema or rash.  Neurological:     Mental Status: She is alert and oriented to person, place, and time.  Psychiatric:        Behavior: Behavior normal.        Thought Content: Thought content  normal.        Judgment: Judgment normal.      Lab Results  Component Value Date   WBC 5.7 06/02/2023   HGB 9.7 (L) 06/02/2023   HCT 30.7 (L) 06/02/2023   MCV 84.8 06/02/2023   PLT 282 06/02/2023   Lab Results  Component Value Date   FERRITIN 53 03/01/2023   IRON  25 (L) 03/01/2023   TIBC 262 03/01/2023   UIBC 237 03/01/2023   IRONPCTSAT 10 (L) 03/01/2023   Lab Results  Component Value Date   RETICCTPCT 1.1 03/01/2023   RBC 3.62 (L) 06/02/2023   No results found for: KPAFRELGTCHN, LAMBDASER, KAPLAMBRATIO No results found for: IGGSERUM, IGA, IGMSERUM No results found for: STEPHANY CARLOTA BENSON MARKEL EARLA JOANNIE DOC VICK, SPEI   Chemistry      Component Value Date/Time   NA 139 06/02/2023 1130   NA 136 (A) 09/08/2017 0000   K 4.0 06/02/2023 1130   CL 103 06/02/2023 1130   CO2 29 06/02/2023 1130   BUN 14 06/02/2023 1130   BUN 8 09/08/2017 0000   CREATININE 0.78 06/02/2023 1130   CREATININE 0.69 08/02/2016 1519   GLU 111 09/08/2017 0000      Component Value Date/Time   CALCIUM 9.5 06/02/2023 1130   ALKPHOS 70 06/02/2023 1130   AST 12 (L) 06/02/2023 1130   ALT 6 06/02/2023 1130   BILITOT 0.3 06/02/2023 1130     Encounter Diagnoses  Name Primary?   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 Hession is a very pleasant 83 yo caucasian female with recurrent undifferentiated  endometrial carcinoma.  She has Lynch syndrome.  She was on immunotherapy.  She tolerated this incredibly well.  It worked incredibly well. This has been on hold per pt request. She is on Zometa  but is overdue for this infusion.   Today she is well other than from the acute loss of her sister. I offered her support during this time as well as my condolences. I offered to have her visit today rescheduled - she declined but would like to reschedule her Zometa . She is overdue for this but I very much understand given today's events. She will return for her Zometa  and iron  which I highly suspect she will need.   RTC ~1 week Dr. Timmy only, labs (CBC, CMP) Zometa  infusion, IV iron   Lauraine CHRISTELLA Dais, PA-C 1/2/202512:58 PM

## 2023-06-02 NOTE — Telephone Encounter (Signed)
 Spoke with Shannon Scott's daughter Shannon Scott who called the office back to schedule a follow up appt. For her mother. Shannon Scott was given an appt.on Wednesday, January 8 th. At 2 pm with Dr. Rogelio. Shannon Scott agreed to date and time and had no further concerns or questions at this time.

## 2023-06-07 ENCOUNTER — Encounter: Payer: Self-pay | Admitting: Obstetrics & Gynecology

## 2023-06-08 ENCOUNTER — Encounter: Payer: Self-pay | Admitting: Obstetrics & Gynecology

## 2023-06-08 ENCOUNTER — Inpatient Hospital Stay: Payer: Medicare Other | Admitting: Obstetrics & Gynecology

## 2023-06-08 VITALS — BP 140/58 | HR 78 | Temp 98.6°F | Resp 18 | Wt 144.6 lb

## 2023-06-08 DIAGNOSIS — Z9221 Personal history of antineoplastic chemotherapy: Secondary | ICD-10-CM | POA: Diagnosis not present

## 2023-06-08 DIAGNOSIS — C541 Malignant neoplasm of endometrium: Secondary | ICD-10-CM

## 2023-06-08 DIAGNOSIS — N9089 Other specified noninflammatory disorders of vulva and perineum: Secondary | ICD-10-CM | POA: Diagnosis not present

## 2023-06-08 DIAGNOSIS — I252 Old myocardial infarction: Secondary | ICD-10-CM | POA: Diagnosis not present

## 2023-06-08 DIAGNOSIS — Z951 Presence of aortocoronary bypass graft: Secondary | ICD-10-CM | POA: Diagnosis not present

## 2023-06-08 DIAGNOSIS — Z923 Personal history of irradiation: Secondary | ICD-10-CM | POA: Diagnosis not present

## 2023-06-08 DIAGNOSIS — R600 Localized edema: Secondary | ICD-10-CM | POA: Diagnosis not present

## 2023-06-08 DIAGNOSIS — Z933 Colostomy status: Secondary | ICD-10-CM | POA: Diagnosis not present

## 2023-06-08 NOTE — Patient Instructions (Signed)
 Return for vulva biopsy.  Vulvar/Vaginal Moisturizers  Moisturizer Options: Vitamin E oil: pump or capsule form Vitamin E cream (Gene's vitamin E cream) Coconut oil: bottle or bead form Shea butter Blossom Organic Lubricant (organic and all natural; www.blossomorganics.com) PE suppository(coconut oil/vitamin E/palm oil) Desert Harvest Aloe Glide      Consider the ingredients of the product - the fewer the ingredients the better!  Directions for Use: Clean and dry your hands Gently dab the vulvar/vaginal area dry as needed Apply a "pea-sized" amount of the moisturizer onto your fingertip Using you other hand, open the labia   Apply the moisturizer to the vulvar/vaginal tissues Wear loose fitting underwear/clothing if possible following application  Use moisturize 2-3 times daily as desired.Vulvar/Vaginal Moisturizers  Moisturizer Options: Vitamin E oil: pump or capsule form Vitamin E cream (Gene's vitamin E cream) Coconut oil: bottle or bead form Shea butter Blossom Organic Lubricant (organic and all natural; www.blossomorganics.com) PE suppository(coconut oil/vitamin E/palm oil) Desert [employer]vest Aloe Glide      Consider the ingredients of the product - the fewer the ingredients the better!  Directions for Use: Clean and dry your hands Gently dab the vulvar/vaginal area dry as needed Apply a "pea-sized" amount of the moisturizer onto your fingertip Using you other hand, open the labia   Apply the moisturizer to the vulvar/vaginal tissues Wear loose fitting underwear/clothing if possible following application  Use moisturize 2-3 times daily as desired.Vulvar/Vaginal Moisturizers  Moisturizer Options: Vitamin E oil: pump or capsule form Vitamin E cream (Gene's vitamin E cream) Coconut oil: bottle or bead form Shea butter Blossom Organic Lubricant (organic and all natural; www.blossomorganics.com) PE suppository(coconut oil/vitamin E/palm oil) Desert [employer]vest Aloe  Glide      Consider the ingredients of the product - the fewer the ingredients the better!  Directions for Use: Clean and dry your hands Gently dab the vulvar/vaginal area dry as needed Apply a "pea-sized" amount of the moisturizer onto your fingertip Using you other hand, open the labia   Apply the moisturizer to the vulvar/vaginal tissues Wear loose fitting underwear/clothing if possible following application  Use moisturize 2-3 times daily as desired.  Healthy vulval hygiene practices Avoid Substitute  Clothing  Pantyhose Stockings with a garter belt Thigh-high or knee-high stockings   Synthetic underwear Cotton underwear or no underwear  Jeans and other tight pants Loose pants, skirts, dresses  Swimsuits, leotards, thongs, lycra garments Loose-fitting cotton garments  Cleansing products  Scented soaps or shampoos Fragrance-free pH neutral soap  Bubble bath Tub baths in the morning and at night without additives and at a comfortable temperature  Scented detergents Unscented detergents  Baby wipes or flushable wipes Rinse with water  using sports water  bottle or perineal irrigation bottle  Feminine sprays, douches, powders These are not necessary products and can be omitted from personal practices  Other  Washcloths Use fingertips for washing; pat dry, do not rub dry  Panty liners Tampons or cotton pads  Dyed toilet articles Toilet articles without dyes  Hair dryers to dry vulva skin without contact Dry vulva by gentle patting

## 2023-06-08 NOTE — Progress Notes (Addendum)
 Follow Up Note: Gyn-Onc  Shannon Scott 83 y.o. female  CC: She presents for a follow-up visit   HPI: The oncology history was reviewed.  Interval History: She denies any vaginal bleeding, abdominal/pelvic pain, cough or abdominal distention. Recently seen in consultation by Hematlogy/Oncology and was felt to be doing well.  She presented in 10/23 w/C/P.  A CTA was negative for a PE.   chest pressure and was taken to the ER. She was found to have an NSTEMI. She then underwent a heart cath without stenting but developed an STEMI on 03/09/2023. She was hospitalized from 03/09/2023-03/15/2023 and did undergo CABG x 3.  . She reports feeling more tired than usual since starting on iron  infusions and Zemaira for bone health. She has scheduled an appointment for another round of iron  infusions, but expresses concerns about potential reactions as she felt unwell after the last combined infusion.  The patient also mentions a lymphedema in her leg, which has been weeping and crusting. She has been managing this with dressings and has been in contact with her healthcare providers about potential treatment options. She expresses frustration about the lack of progress in treating this issue    Review of Systems  Review of Systems  Constitutional:  Negative for malaise/fatigue and weight loss.  Respiratory:  Negative for shortness of breath and wheezing.   Cardiovascular:  Negative for chest pain and leg swelling.  Gastrointestinal:  Negative for abdominal pain, blood in stool, constipation, nausea and vomiting.  Genitourinary:  Negative for dysuria, frequency, hematuria and urgency.  Musculoskeletal:  Negative for joint pain and myalgias.  Neurological:  Negative for weakness.  Psychiatric/Behavioral:  Negative for depression. The patient does not have insomnia.    Current medications, allergy, social history, past surgical history, past medical history, family history were all  reviewed.    Vitals:  BP (!) 140/58   Pulse 78   Temp 98.6 F (37 C) (Oral)   Resp 18   Wt 144 lb 9.6 oz (65.6 kg)   SpO2 99%   BMI 27.32 kg/m   Physical Exam:  Physical Exam Exam conducted with a chaperone present.  Constitutional:      General: She is not in acute distress. Cardiovascular:     Rate and Rhythm: Normal rate and regular rhythm.  Pulmonary:     Effort: Pulmonary effort is normal.     Breath sounds: Normal breath sounds. No wheezing or rhonchi.  Abdominal:     Palpations: Abdomen is soft.     Tenderness: There is no abdominal tenderness. There is no right CVA tenderness or left CVA tenderness. Pink stoma    Hernia: Large hernia is present.  Genitourinary:    General: Moderate b/l erythema; right labia minor white plaques--3 mm    Urethra: No urethral lesion.     Vagina: No lesions. No bleeding; narrow caliber Musculoskeletal:     Cervical back: Neck supple.     Right lower leg: No edema.     Left lower leg: Marked edema, erythema, pressure injuries Lymphadenopathy:     Upper Body:     Right upper body: No supraclavicular adenopathy.     Left upper body: No supraclavicular adenopathy.     Lower Body: No right inguinal adenopathy. No left inguinal adenopathy.  Skin:    Findings: No rash.  Neurological:     Mental Status: She is oriented to person, place, and time.    Assessment/Plan:  Endometrial cancer [facility]) KEZIA BENEVIDES is  a 83 y.o. with recurrent undirfferentiated endometrial cancer Chronic LLE lymphedema Vulva dermatitis, white plaques; she declined a bx at today's visit   -Return for vulva punch biopsy -Continue q 6 month follow-up     I personally spent 25 minutes face-to-face and non-face-to-face in the care of this patient, which includes all pre, intra, and post visit time on the date of service.   Olam Mill, MD

## 2023-06-08 NOTE — Assessment & Plan Note (Addendum)
 Shannon Scott is a 83 y.o. with recurrent undirfferentiated endometrial cancer Chronic LLE lymphedema Vulva dermatitis, white plaques; she declined a bx at today's visit   -Return for vulva punch biopsy -Continue q 6 month follow-up

## 2023-06-10 ENCOUNTER — Inpatient Hospital Stay: Payer: Medicare Other

## 2023-06-10 ENCOUNTER — Ambulatory Visit: Payer: Medicare Other

## 2023-06-10 ENCOUNTER — Inpatient Hospital Stay: Payer: Medicare Other | Admitting: Hematology & Oncology

## 2023-06-13 ENCOUNTER — Other Ambulatory Visit (HOSPITAL_COMMUNITY): Payer: Self-pay

## 2023-06-16 ENCOUNTER — Inpatient Hospital Stay: Payer: Medicare Other

## 2023-06-16 ENCOUNTER — Encounter: Payer: Self-pay | Admitting: Medical Oncology

## 2023-06-16 ENCOUNTER — Other Ambulatory Visit: Payer: Self-pay | Admitting: Medical Oncology

## 2023-06-16 ENCOUNTER — Inpatient Hospital Stay: Payer: Medicare Other | Admitting: Medical Oncology

## 2023-06-16 VITALS — BP 152/42 | HR 69 | Temp 98.2°F | Resp 18 | Ht 61.0 in | Wt 144.1 lb

## 2023-06-16 VITALS — BP 149/41 | HR 65 | Resp 17

## 2023-06-16 DIAGNOSIS — Z9221 Personal history of antineoplastic chemotherapy: Secondary | ICD-10-CM | POA: Diagnosis not present

## 2023-06-16 DIAGNOSIS — D62 Acute posthemorrhagic anemia: Secondary | ICD-10-CM

## 2023-06-16 DIAGNOSIS — C539 Malignant neoplasm of cervix uteri, unspecified: Secondary | ICD-10-CM

## 2023-06-16 DIAGNOSIS — C799 Secondary malignant neoplasm of unspecified site: Secondary | ICD-10-CM

## 2023-06-16 DIAGNOSIS — D508 Other iron deficiency anemias: Secondary | ICD-10-CM

## 2023-06-16 DIAGNOSIS — C541 Malignant neoplasm of endometrium: Secondary | ICD-10-CM

## 2023-06-16 DIAGNOSIS — Z95828 Presence of other vascular implants and grafts: Secondary | ICD-10-CM

## 2023-06-16 DIAGNOSIS — R600 Localized edema: Secondary | ICD-10-CM | POA: Diagnosis not present

## 2023-06-16 DIAGNOSIS — I252 Old myocardial infarction: Secondary | ICD-10-CM | POA: Diagnosis not present

## 2023-06-16 DIAGNOSIS — Z933 Colostomy status: Secondary | ICD-10-CM | POA: Diagnosis not present

## 2023-06-16 DIAGNOSIS — Z923 Personal history of irradiation: Secondary | ICD-10-CM | POA: Diagnosis not present

## 2023-06-16 DIAGNOSIS — Z951 Presence of aortocoronary bypass graft: Secondary | ICD-10-CM | POA: Diagnosis not present

## 2023-06-16 LAB — CMP (CANCER CENTER ONLY)
ALT: 6 U/L (ref 0–44)
AST: 13 U/L — ABNORMAL LOW (ref 15–41)
Albumin: 3.9 g/dL (ref 3.5–5.0)
Alkaline Phosphatase: 67 U/L (ref 38–126)
Anion gap: 7 (ref 5–15)
BUN: 14 mg/dL (ref 8–23)
CO2: 28 mmol/L (ref 22–32)
Calcium: 9.2 mg/dL (ref 8.9–10.3)
Chloride: 104 mmol/L (ref 98–111)
Creatinine: 0.86 mg/dL (ref 0.44–1.00)
GFR, Estimated: >60 mL/min (ref >60)
Glucose, Bld: 121 mg/dL — ABNORMAL HIGH (ref 70–99)
Potassium: 4 mmol/L (ref 3.5–5.1)
Sodium: 139 mmol/L (ref 135–145)
Total Bilirubin: 0.3 mg/dL (ref 0.0–1.2)
Total Protein: 6.6 g/dL (ref 6.5–8.1)

## 2023-06-16 LAB — CBC WITH DIFFERENTIAL (CANCER CENTER ONLY)
Abs Immature Granulocytes: 0.01 10*3/uL (ref 0.00–0.07)
Basophils Absolute: 0 10*3/uL (ref 0.0–0.1)
Basophils Relative: 1 %
Eosinophils Absolute: 0.1 10*3/uL (ref 0.0–0.5)
Eosinophils Relative: 1 %
HCT: 30 % — ABNORMAL LOW (ref 36.0–46.0)
Hemoglobin: 9.5 g/dL — ABNORMAL LOW (ref 12.0–15.0)
Immature Granulocytes: 0 %
Lymphocytes Relative: 14 %
Lymphs Abs: 0.6 10*3/uL — ABNORMAL LOW (ref 0.7–4.0)
MCH: 26.7 pg (ref 26.0–34.0)
MCHC: 31.7 g/dL (ref 30.0–36.0)
MCV: 84.3 fL (ref 80.0–100.0)
Monocytes Absolute: 0.3 10*3/uL (ref 0.1–1.0)
Monocytes Relative: 7 %
Neutro Abs: 3.4 10*3/uL (ref 1.7–7.7)
Neutrophils Relative %: 77 %
Platelet Count: 293 10*3/uL (ref 150–400)
RBC: 3.56 MIL/uL — ABNORMAL LOW (ref 3.87–5.11)
RDW: 14.7 % (ref 11.5–15.5)
WBC Count: 4.4 10*3/uL (ref 4.0–10.5)
nRBC: 0 % (ref 0.0–0.2)

## 2023-06-16 LAB — FERRITIN: Ferritin: 35 ng/mL (ref 11–307)

## 2023-06-16 LAB — RETIC PANEL
Immature Retic Fract: 10.4 % (ref 2.3–15.9)
RBC.: 3.49 MIL/uL — ABNORMAL LOW (ref 3.87–5.11)
Retic Count, Absolute: 30.7 10*3/uL (ref 19.0–186.0)
Retic Ct Pct: 0.9 % (ref 0.4–3.1)
Reticulocyte Hemoglobin: 26.2 pg — ABNORMAL LOW (ref >27.9)

## 2023-06-16 LAB — IRON AND IRON BINDING CAPACITY (CC-WL,HP ONLY)
Iron: 27 ug/dL — ABNORMAL LOW (ref 28–170)
Saturation Ratios: 9 % — ABNORMAL LOW (ref 10.4–31.8)
TIBC: 294 ug/dL (ref 250–450)
UIBC: 267 ug/dL (ref 148–442)

## 2023-06-16 LAB — LACTATE DEHYDROGENASE: LDH: 125 U/L (ref 98–192)

## 2023-06-16 MED ORDER — HEPARIN SOD (PORK) LOCK FLUSH 100 UNIT/ML IV SOLN
500.0000 [IU] | Freq: Once | INTRAVENOUS | Status: AC
  Administered 2023-06-16: 500 [IU] via INTRAVENOUS

## 2023-06-16 MED ORDER — SODIUM CHLORIDE 0.9% FLUSH
10.0000 mL | Freq: Once | INTRAVENOUS | Status: AC
Start: 2023-06-16 — End: 2023-06-16
  Administered 2023-06-16: 10 mL via INTRAVENOUS

## 2023-06-16 MED ORDER — MUPIROCIN 2 % EX OINT: 1.0000 | TOPICAL_OINTMENT | Freq: Two times a day (BID) | CUTANEOUS | 0 refills | Status: DC

## 2023-06-16 MED ORDER — IRON SUCROSE 20 MG/ML IV SOLN
200.0000 mg | Freq: Once | INTRAVENOUS | Status: AC
  Administered 2023-06-16: 200 mg via INTRAVENOUS
  Filled 2023-06-16: qty 10

## 2023-06-16 MED ORDER — SODIUM CHLORIDE 0.9 % IV SOLN: Freq: Once | INTRAVENOUS | Status: AC

## 2023-06-16 NOTE — Patient Instructions (Signed)

## 2023-06-16 NOTE — Progress Notes (Signed)
Hematology and Oncology Follow Up Visit  Shannon Scott [id number] [birth year] 83 y.o. 06/16/2023   Principle Diagnosis:  Locally advanced/recurrent endometrial carcinoma undifferentiated --  TMB (HIGH) / MSI HIGH  Intermittent iron deficiency anemia   Past Therapy: Radiation therapy for 5 -5 1/2 weeks - s/p 20 fractions Taxol/carboplatinum q 7 days -- s/p cycle 6    Current Therapy:        Pembrolizumab 400 mg q 12 weeks, s/p cycle 20 (originally started on 01/23/2019 at 400 mg IV q 6 wk)  --on hold for patient request since April 2022 Zometa 4 mg IV q 3 months -- next dose due 01/18/2023 IV iron as indicated --Venofer given on 10/08/2022       Interim History:  [name] is here today for follow-up and consideration of IV iron.   At her last visit she had just received news that her sister had passed away and so it was elected to hold the Zometa as she wanted to get home. Today she reports wanting to hold the Zometa again. She states that the last time that she had zometa with iron she did not feel great after but tolerates both well if they are given individually.   She is still on her Apixaban, 81 mg asa, and clopidogrel. She has had some bruising but denies any bleeding. She is followed closely by cardiology. She had a STEMI on 03/09/2023. She was hospitalized from 03/09/2023-03/15/2023 and did undergo CABG x 3.    She has a colostomy.  She has a large hernia around the colostomy site.  She denies dental concerns, upcoming dental work or jaw pain.   She continues to have peripheral lymphedema of her left leg. The area used to be dry but is now starting to become a bit shiny and pink. She has used an unscented vaseline lotion on it without improvement. No fevers or recent infections.   Back on 06/02/2023, her iron saturation was 9% and ferritin was 36. She is here for IV iron today.   Currently, I would say that her performance status is probably ECOG 1.   Wt Readings  from Last 3 Encounters:  06/16/23 144 lb 1.3 oz (65.4 kg)  06/08/23 144 lb 9.6 oz (65.6 kg)  06/02/23 145 lb 6.4 oz (66 kg)   Medications:  Allergies as of 06/16/2023       Reactions   Statins Other (See Comments)   Myalgias and memory problems   Ciprofloxacin Itching, Swelling, Other (See Comments)   Other reaction(s): swelling/redness at injection site        Medication List        Accurate as of June 16, 2023  1:48 PM. If you have any questions, ask your nurse or doctor.          apixaban 5 MG Tabs tablet Commonly known as: ELIQUIS Take 1 tablet (5 mg total) by mouth 2 (two) times daily.   clobetasol ointment 0.05 % Commonly known as: TEMOVATE Apply 1 Application topically 2 (two) times a week.   clopidogrel 75 MG tablet Commonly known as: PLAVIX Take 1 tablet (75 mg total) by mouth daily with breakfast.   diltiazem 300 MG 24 hr capsule Commonly known as: Cardizem CD Take 1 capsule (300 mg total) by mouth daily.   ezetimibe 10 MG tablet Commonly known as: ZETIA Take 10 mg by mouth daily.   furosemide 20 MG tablet Commonly known as: LASIX Take one tablet daily on Monday, Wednesday and  Friday   levothyroxine 50 MCG tablet Commonly known as: SYNTHROID Take 1 tablet (50 mcg total) by mouth daily before breakfast.   mometasone 0.1 % cream Commonly known as: ELOCON Apply 1 Application topically daily as needed (to the vulva for irritation and itching).   mupirocin ointment 2 % Commonly known as: BACTROBAN Apply 1 Application topically 2 (two) times daily. Started by: Rushie Chestnut   nitroGLYCERIN 0.4 MG SL tablet Commonly known as: NITROSTAT Place 1 tablet (0.4 mg total) under the tongue every 5 (five) minutes as needed for chest pain.   polyethylene glycol 17 g packet Commonly known as: MIRALAX / GLYCOLAX Take 17 g by mouth daily as needed for severe constipation. What changed: when to take this   Repatha SureClick 140 MG/ML Soaj Generic  drug: Evolocumab INJECT 1 DOSE UNDER THE SKIN EVERY 14 DAYS   traMADol 50 MG tablet Commonly known as: ULTRAM TAKE 2 TABLETS(100 MG) BY MOUTH TWICE DAILY        Allergies:  Allergies  Allergen Reactions   Statins Other (See Comments)    Myalgias and memory problems   Ciprofloxacin Itching, Swelling and Other (See Comments)    Other reaction(s): swelling/redness at injection site    Past Medical History, Surgical history, Social history, and Family History were reviewed and updated.  Review of Systems: Review of Systems  Constitutional: Negative.   HENT: Negative.    Eyes: Negative.   Respiratory: Negative.    Cardiovascular:  Positive for leg swelling.  Gastrointestinal: Negative.   Genitourinary: Negative.   Musculoskeletal:  Positive for back pain.  Skin: Negative.   Neurological: Negative.   Endo/Heme/Allergies: Negative.   Psychiatric/Behavioral: Negative.       Physical Exam:  height is 5\' 1"  (1.549 m) and weight is 144 lb 1.3 oz (65.4 kg). Her oral temperature is 98.2 F (36.8 C). Her blood pressure is 152/42 (abnormal) and her pulse is 69. Her respiration is 18 and oxygen saturation is 100%.   Wt Readings from Last 3 Encounters:  06/16/23 144 lb 1.3 oz (65.4 kg)  06/08/23 144 lb 9.6 oz (65.6 kg)  06/02/23 145 lb 6.4 oz (66 kg)    Physical Exam Vitals reviewed.  HENT:     Head: Normocephalic and atraumatic.  Eyes:     Pupils: Pupils are equal, round, and reactive to light.  Cardiovascular:     Rate and Rhythm: Normal rate and regular rhythm.     Heart sounds: Normal heart sounds.     Comments: Cardiac exam shows a regular rate and rhythm.  She has a 4/6 systolic ejection murmur.   Pulmonary:     Effort: Pulmonary effort is normal.     Breath sounds: Normal breath sounds.  Abdominal:     General: Bowel sounds are normal.     Palpations: Abdomen is soft.  Musculoskeletal:        General: No tenderness or deformity. Normal range of motion.      Cervical back: Normal range of motion.     Comments: She has significant lymphedema in her legs.  She has probably 3+ in the left leg and 1+ in the right leg. She has pitting edema.  She has decent range of motion of her joints.  Lymphadenopathy:     Cervical: No cervical adenopathy.  Skin:    General: Skin is warm and dry.     Findings: No erythema or rash.     Comments: Skin of the left lower anterior  leg is mildly pink with scattered superficial skin breakdown. No discharge or ulcerations.   Neurological:     Mental Status: She is alert and oriented to person, place, and time.  Psychiatric:        Behavior: Behavior normal.        Thought Content: Thought content normal.        Judgment: Judgment normal.      Lab Results  Component Value Date   WBC 4.4 06/16/2023   HGB 9.5 (L) 06/16/2023   HCT 30.0 (L) 06/16/2023   MCV 84.3 06/16/2023   PLT 293 06/16/2023   Lab Results  Component Value Date   FERRITIN 36 06/02/2023   IRON 27 (L) 06/02/2023   TIBC 290 06/02/2023   UIBC 263 06/02/2023   IRONPCTSAT 9 (L) 06/02/2023   Lab Results  Component Value Date   RETICCTPCT 0.9 06/16/2023   RBC 3.56 (L) 06/16/2023   RBC 3.49 (L) 06/16/2023   No results found for: "KPAFRELGTCHN", "LAMBDASER", "KAPLAMBRATIO" No results found for: "IGGSERUM", "IGA", "IGMSERUM" No results found for: "TOTALPROTELP", "ALBUMINELP", "A1GS", "A2GS", "BETS", "BETA2SER", "GAMS", "MSPIKE", "SPEI"   Chemistry      Component Value Date/Time   NA 139 06/16/2023 1129   NA 136 (A) 09/08/2017 0000   K 4.0 06/16/2023 1129   CL 104 06/16/2023 1129   CO2 28 06/16/2023 1129   BUN 14 06/16/2023 1129   BUN 8 09/08/2017 0000   CREATININE 0.86 06/16/2023 1129   CREATININE 0.69 08/02/2016 1519   GLU 111 09/08/2017 0000      Component Value Date/Time   CALCIUM 9.2 06/16/2023 1129   ALKPHOS 67 06/16/2023 1129   AST 13 (L) 06/16/2023 1129   ALT 6 06/16/2023 1129   BILITOT 0.3 06/16/2023 1129     Encounter  Diagnoses  Name Primary?   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 (HCC) Yes   Endometrial cancer (HCC)      Impression and Plan: Ms. Grandi is a very pleasant 41 yo caucasian female with recurrent undifferentiated endometrial carcinoma.  She has Lynch syndrome.  She was on immunotherapy.  She tolerated this incredibly well.  It worked incredibly well. This has been on hold per pt request. She is on Zometa but is overdue for this infusion.   Today we will administer her iron as her Hgb has fallen a bit and she is iron deficient.  We will administer Zometa after her iron infusion have been completed per pt request  I will call in Bactroban ointment for her to use to help treat and prevent infection of the leg. Would care vs PCP for Unna boot application suggested. Reviewed red flags.   RTC weekly IV iron x 4 more weeks total RTC 5 weeks MD, labs(CBC w/, CMP, LDH, retic), Zometa   Brand Males [place], [place] 1/16/20251:48 PM

## 2023-06-16 NOTE — Patient Instructions (Signed)
Iron Sucrose Injection What is this medication? IRON SUCROSE (EYE ern SOO krose) treats low levels of iron (iron deficiency anemia) in people with kidney disease. Iron is a mineral that plays an important role in making red blood cells, which carry oxygen from your lungs to the rest of your body. This medicine may be used for other purposes; ask your health care provider or pharmacist if you have questions. COMMON BRAND NAME(S): Venofer What should I tell my care team before I take this medication? They need to know if you have any of these conditions: Anemia not caused by low iron levels Heart disease High levels of iron in the blood Kidney disease Liver disease An unusual or allergic reaction to iron, other medications, foods, dyes, or preservatives Pregnant or trying to get pregnant Breastfeeding How should I use this medication? This medication is for infusion into a vein. It is given in a hospital or clinic setting. Talk to your care team about the use of this medication in children. While this medication may be prescribed for children as young as 2 years for selected conditions, precautions do apply. Overdosage: If you think you have taken too much of this medicine contact a poison control center or emergency room at once. NOTE: This medicine is only for you. Do not share this medicine with others. What if I miss a dose? Keep appointments for follow-up doses. It is important not to miss your dose. Call your care team if you are unable to keep an appointment. What may interact with this medication? Do not take this medication with any of the following: Deferoxamine Dimercaprol Other iron products This medication may also interact with the following: Chloramphenicol Deferasirox This list may not describe all possible interactions. Give your health care provider a list of all the medicines, herbs, non-prescription drugs, or dietary supplements you use. Also tell them if you smoke,  drink alcohol, or use illegal drugs. Some items may interact with your medicine. What should I watch for while using this medication? Visit your care team regularly. Tell your care team if your symptoms do not start to get better or if they get worse. You may need blood work done while you are taking this medication. You may need to follow a special diet. Talk to your care team. Foods that contain iron include: whole grains/cereals, dried fruits, beans, or peas, leafy green vegetables, and organ meats (liver, kidney). What side effects may I notice from receiving this medication? Side effects that you should report to your care team as soon as possible: Allergic reactions--skin rash, itching, hives, swelling of the face, lips, tongue, or throat Low blood pressure--dizziness, feeling faint or lightheaded, blurry vision Shortness of breath Side effects that usually do not require medical attention (report to your care team if they continue or are bothersome): Flushing Headache Joint pain Muscle pain Nausea Pain, redness, or irritation at injection site This list may not describe all possible side effects. Call your doctor for medical advice about side effects. You may report side effects to FDA at 1-800-FDA-1088. Where should I keep my medication? This medication is given in a hospital or clinic. It will not be stored at home. NOTE: This sheet is a summary. It may not cover all possible information. If you have questions about this medicine, talk to your doctor, pharmacist, or health care provider.  2024 Elsevier/Gold Standard (2022-10-22 00:00:00)

## 2023-06-20 ENCOUNTER — Telehealth: Payer: Self-pay | Admitting: Hematology & Oncology

## 2023-06-20 NOTE — Telephone Encounter (Signed)
Called to schedule IV Iron. LVM to return call for scheduling.

## 2023-06-21 ENCOUNTER — Encounter: Payer: Self-pay | Admitting: Medical Oncology

## 2023-06-22 ENCOUNTER — Telehealth: Payer: Self-pay | Admitting: Hematology & Oncology

## 2023-06-22 NOTE — Telephone Encounter (Signed)
Called to schedule IV iron. LVM to return call for scheduling.

## 2023-06-27 ENCOUNTER — Telehealth: Payer: Self-pay | Admitting: Hematology & Oncology

## 2023-06-27 ENCOUNTER — Telehealth: Payer: Self-pay | Admitting: *Deleted

## 2023-06-27 NOTE — Telephone Encounter (Signed)
Called pt to schedule IV Iron per inbasket. Pt did not wish to schedule at this time as she needed to see what days she could arrange transportation. Pt stated they would call back for scheduling.

## 2023-06-27 NOTE — Telephone Encounter (Signed)
LMOM for the patient to call the office back. Patient needs to be scheduled with Dr Tamela Oddi for a biopsy. Per last office note; patient wanted to come back at a later time for a biopsy appt.

## 2023-07-04 ENCOUNTER — Inpatient Hospital Stay: Payer: Medicare Other | Attending: Hematology & Oncology

## 2023-07-04 VITALS — BP 164/46 | HR 74 | Temp 98.6°F | Resp 18

## 2023-07-04 DIAGNOSIS — I890 Lymphedema, not elsewhere classified: Secondary | ICD-10-CM | POA: Insufficient documentation

## 2023-07-04 DIAGNOSIS — Z7901 Long term (current) use of anticoagulants: Secondary | ICD-10-CM | POA: Insufficient documentation

## 2023-07-04 DIAGNOSIS — Z1509 Genetic susceptibility to other malignant neoplasm: Secondary | ICD-10-CM | POA: Diagnosis not present

## 2023-07-04 DIAGNOSIS — I252 Old myocardial infarction: Secondary | ICD-10-CM | POA: Diagnosis not present

## 2023-07-04 DIAGNOSIS — S81802A Unspecified open wound, left lower leg, initial encounter: Secondary | ICD-10-CM | POA: Insufficient documentation

## 2023-07-04 DIAGNOSIS — Z923 Personal history of irradiation: Secondary | ICD-10-CM | POA: Diagnosis not present

## 2023-07-04 DIAGNOSIS — D509 Iron deficiency anemia, unspecified: Secondary | ICD-10-CM | POA: Diagnosis not present

## 2023-07-04 DIAGNOSIS — Z7902 Long term (current) use of antithrombotics/antiplatelets: Secondary | ICD-10-CM | POA: Diagnosis not present

## 2023-07-04 DIAGNOSIS — Z7951 Long term (current) use of inhaled steroids: Secondary | ICD-10-CM | POA: Insufficient documentation

## 2023-07-04 DIAGNOSIS — C541 Malignant neoplasm of endometrium: Secondary | ICD-10-CM | POA: Diagnosis present

## 2023-07-04 DIAGNOSIS — Z79899 Other long term (current) drug therapy: Secondary | ICD-10-CM | POA: Insufficient documentation

## 2023-07-04 DIAGNOSIS — D508 Other iron deficiency anemias: Secondary | ICD-10-CM

## 2023-07-04 MED ORDER — IRON SUCROSE 20 MG/ML IV SOLN
200.0000 mg | Freq: Once | INTRAVENOUS | Status: AC
  Administered 2023-07-04: 200 mg via INTRAVENOUS
  Filled 2023-07-04: qty 10

## 2023-07-04 MED ORDER — SODIUM CHLORIDE 0.9 % IV SOLN: Freq: Once | INTRAVENOUS | Status: AC

## 2023-07-04 MED ORDER — SODIUM CHLORIDE 0.9% FLUSH
10.0000 mL | INTRAVENOUS | Status: DC | PRN
  Administered 2023-07-04: 10 mL via INTRAVENOUS

## 2023-07-04 MED ORDER — HEPARIN SOD (PORK) LOCK FLUSH 100 UNIT/ML IV SOLN
500.0000 [IU] | Freq: Once | INTRAVENOUS | Status: AC
  Administered 2023-07-04: 500 [IU] via INTRAVENOUS

## 2023-07-04 NOTE — Patient Instructions (Signed)

## 2023-07-05 ENCOUNTER — Encounter: Payer: Self-pay | Admitting: Family

## 2023-07-08 ENCOUNTER — Other Ambulatory Visit (HOSPITAL_COMMUNITY): Payer: Self-pay

## 2023-07-12 ENCOUNTER — Other Ambulatory Visit: Payer: Self-pay

## 2023-07-13 ENCOUNTER — Other Ambulatory Visit: Payer: Self-pay | Admitting: Internal Medicine

## 2023-07-13 DIAGNOSIS — E785 Hyperlipidemia, unspecified: Secondary | ICD-10-CM

## 2023-07-13 DIAGNOSIS — Z951 Presence of aortocoronary bypass graft: Secondary | ICD-10-CM

## 2023-07-18 ENCOUNTER — Encounter (HOSPITAL_BASED_OUTPATIENT_CLINIC_OR_DEPARTMENT_OTHER): Payer: Self-pay | Admitting: Internal Medicine

## 2023-07-21 ENCOUNTER — Inpatient Hospital Stay: Payer: Medicare Other

## 2023-07-21 ENCOUNTER — Inpatient Hospital Stay: Payer: Medicare Other | Admitting: Hematology & Oncology

## 2023-07-28 ENCOUNTER — Encounter: Payer: Self-pay | Admitting: Medical Oncology

## 2023-07-28 ENCOUNTER — Inpatient Hospital Stay (HOSPITAL_BASED_OUTPATIENT_CLINIC_OR_DEPARTMENT_OTHER): Payer: Medicare Other | Admitting: Medical Oncology

## 2023-07-28 ENCOUNTER — Inpatient Hospital Stay (HOSPITAL_BASED_OUTPATIENT_CLINIC_OR_DEPARTMENT_OTHER): Payer: Medicare Other

## 2023-07-28 ENCOUNTER — Inpatient Hospital Stay: Payer: Medicare Other

## 2023-07-28 VITALS — BP 135/46 | HR 60 | Temp 98.2°F | Resp 18 | Ht 61.0 in | Wt 143.0 lb

## 2023-07-28 DIAGNOSIS — C541 Malignant neoplasm of endometrium: Secondary | ICD-10-CM

## 2023-07-28 DIAGNOSIS — Z7951 Long term (current) use of inhaled steroids: Secondary | ICD-10-CM | POA: Diagnosis not present

## 2023-07-28 DIAGNOSIS — Z7902 Long term (current) use of antithrombotics/antiplatelets: Secondary | ICD-10-CM | POA: Diagnosis not present

## 2023-07-28 DIAGNOSIS — Z95828 Presence of other vascular implants and grafts: Secondary | ICD-10-CM | POA: Diagnosis not present

## 2023-07-28 DIAGNOSIS — S81802A Unspecified open wound, left lower leg, initial encounter: Secondary | ICD-10-CM | POA: Diagnosis not present

## 2023-07-28 DIAGNOSIS — Z7901 Long term (current) use of anticoagulants: Secondary | ICD-10-CM | POA: Diagnosis not present

## 2023-07-28 DIAGNOSIS — D649 Anemia, unspecified: Secondary | ICD-10-CM

## 2023-07-28 DIAGNOSIS — D509 Iron deficiency anemia, unspecified: Secondary | ICD-10-CM | POA: Diagnosis not present

## 2023-07-28 DIAGNOSIS — C539 Malignant neoplasm of cervix uteri, unspecified: Secondary | ICD-10-CM

## 2023-07-28 DIAGNOSIS — I252 Old myocardial infarction: Secondary | ICD-10-CM | POA: Diagnosis not present

## 2023-07-28 DIAGNOSIS — I890 Lymphedema, not elsewhere classified: Secondary | ICD-10-CM | POA: Diagnosis not present

## 2023-07-28 DIAGNOSIS — Z79899 Other long term (current) drug therapy: Secondary | ICD-10-CM | POA: Diagnosis not present

## 2023-07-28 DIAGNOSIS — D508 Other iron deficiency anemias: Secondary | ICD-10-CM

## 2023-07-28 DIAGNOSIS — Z923 Personal history of irradiation: Secondary | ICD-10-CM | POA: Diagnosis not present

## 2023-07-28 LAB — CBC WITH DIFFERENTIAL (CANCER CENTER ONLY)
Abs Immature Granulocytes: 0.01 10*3/uL (ref 0.00–0.07)
Basophils Absolute: 0 10*3/uL (ref 0.0–0.1)
Basophils Relative: 1 %
Eosinophils Absolute: 0.1 10*3/uL (ref 0.0–0.5)
Eosinophils Relative: 1 %
HCT: 28 % — ABNORMAL LOW (ref 36.0–46.0)
Hemoglobin: 8.9 g/dL — ABNORMAL LOW (ref 12.0–15.0)
Immature Granulocytes: 0 %
Lymphocytes Relative: 17 %
Lymphs Abs: 0.7 10*3/uL (ref 0.7–4.0)
MCH: 27.1 pg (ref 26.0–34.0)
MCHC: 31.8 g/dL (ref 30.0–36.0)
MCV: 85.4 fL (ref 80.0–100.0)
Monocytes Absolute: 0.3 10*3/uL (ref 0.1–1.0)
Monocytes Relative: 7 %
Neutro Abs: 2.8 10*3/uL (ref 1.7–7.7)
Neutrophils Relative %: 74 %
Platelet Count: 264 10*3/uL (ref 150–400)
RBC: 3.28 MIL/uL — ABNORMAL LOW (ref 3.87–5.11)
RDW: 15.8 % — ABNORMAL HIGH (ref 11.5–15.5)
WBC Count: 3.8 10*3/uL — ABNORMAL LOW (ref 4.0–10.5)
nRBC: 0 % (ref 0.0–0.2)

## 2023-07-28 LAB — LACTATE DEHYDROGENASE: LDH: 117 U/L (ref 98–192)

## 2023-07-28 LAB — IRON AND IRON BINDING CAPACITY (CC-WL,HP ONLY)
Iron: 23 ug/dL — ABNORMAL LOW (ref 28–170)
Saturation Ratios: 9 % — ABNORMAL LOW (ref 10.4–31.8)
TIBC: 252 ug/dL (ref 250–450)
UIBC: 229 ug/dL (ref 148–442)

## 2023-07-28 LAB — CMP (CANCER CENTER ONLY)
ALT: 6 U/L (ref 0–44)
AST: 13 U/L — ABNORMAL LOW (ref 15–41)
Albumin: 3.7 g/dL (ref 3.5–5.0)
Alkaline Phosphatase: 66 U/L (ref 38–126)
Anion gap: 8 (ref 5–15)
BUN: 15 mg/dL (ref 8–23)
CO2: 27 mmol/L (ref 22–32)
Calcium: 9.4 mg/dL (ref 8.9–10.3)
Chloride: 105 mmol/L (ref 98–111)
Creatinine: 0.81 mg/dL (ref 0.44–1.00)
GFR, Estimated: >60 mL/min (ref >60)
Glucose, Bld: 116 mg/dL — ABNORMAL HIGH (ref 70–99)
Potassium: 4 mmol/L (ref 3.5–5.1)
Sodium: 140 mmol/L (ref 135–145)
Total Bilirubin: 0.3 mg/dL (ref 0.0–1.2)
Total Protein: 5.9 g/dL — ABNORMAL LOW (ref 6.5–8.1)

## 2023-07-28 LAB — RETIC PANEL
Immature Retic Fract: 8.7 % (ref 2.3–15.9)
RBC.: 3.26 MIL/uL — ABNORMAL LOW (ref 3.87–5.11)
Retic Count, Absolute: 36.2 10*3/uL (ref 19.0–186.0)
Retic Ct Pct: 1.1 % (ref 0.4–3.1)
Reticulocyte Hemoglobin: 27.9 pg — ABNORMAL LOW (ref >27.9)

## 2023-07-28 LAB — VITAMIN B12: Vitamin B-12: 348 pg/mL (ref 180–914)

## 2023-07-28 LAB — FERRITIN: Ferritin: 77 ng/mL (ref 11–307)

## 2023-07-28 MED ORDER — SODIUM CHLORIDE 0.9% FLUSH
10.0000 mL | INTRAVENOUS | Status: DC | PRN
  Administered 2023-07-28: 10 mL via INTRAVENOUS

## 2023-07-28 MED ORDER — ZOLEDRONIC ACID 4 MG/100ML IV SOLN: 4.0000 mg | Freq: Once | INTRAVENOUS | Status: DC

## 2023-07-28 MED ORDER — SODIUM CHLORIDE 0.9% FLUSH: 3.0000 mL | Freq: Once | INTRAVENOUS | Status: DC | PRN

## 2023-07-28 MED ORDER — HEPARIN SOD (PORK) LOCK FLUSH 100 UNIT/ML IV SOLN
500.0000 [IU] | Freq: Once | INTRAVENOUS | Status: AC
  Administered 2023-07-28: 500 [IU] via INTRAVENOUS

## 2023-07-28 MED ORDER — HEPARIN SOD (PORK) LOCK FLUSH 100 UNIT/ML IV SOLN
250.0000 [IU] | Freq: Once | INTRAVENOUS | Status: DC | PRN
Start: 2023-07-28 — End: 2023-07-28

## 2023-07-28 MED ORDER — IRON SUCROSE 20 MG/ML IV SOLN
200.0000 mg | Freq: Once | INTRAVENOUS | Status: AC
  Administered 2023-07-28: 200 mg via INTRAVENOUS
  Filled 2023-07-28: qty 10

## 2023-07-28 NOTE — Patient Instructions (Signed)

## 2023-07-28 NOTE — Progress Notes (Signed)
 No Zometa today per patient request.  Richardean Sale, RPH, BCPS, BCOP 07/28/2023 12:57 PM

## 2023-07-28 NOTE — Progress Notes (Signed)
 Hematology and Oncology Follow Up Visit  PRIM MORACE [id number] 05-11-1941 83 y.o. 07/28/2023   Principle Diagnosis:  Locally advanced/recurrent endometrial carcinoma undifferentiated --  TMB (HIGH) / MSI HIGH  Intermittent iron deficiency anemia   Past Therapy: Radiation therapy for 5 -5 1/2 weeks - s/p 20 fractions Taxol/carboplatinum q 7 days -- s/p cycle 6    Current Therapy:        Pembrolizumab 400 mg q 12 weeks, s/p cycle 20 (originally started on 01/23/2019 at 400 mg IV q 6 wk)  --on hold for patient request since April 2022 Zometa 4 mg IV q 3 months -- next dose due 01/18/2023 IV iron as indicated --Venofer - last infusion was on 07/04/2023       Interim History:  Ms. Berrie is here today for follow-up and consideration of IV iron. She is here with her daughter.   Today they state that she is doing well. She is feeling better with the IV iron. They had to reschedule a few times due to weather so her interval has been off.   She is still on her Apixaban, 81 mg asa, and clopidogrel. She has had some bruising but denies any bleeding. She is followed closely by cardiology. She had a STEMI on 03/09/2023. She was hospitalized from 03/09/2023-03/15/2023 and did undergo CABG x 3.    She has a colostomy.  She has a large hernia around the colostomy site. There has been no bleeding to her knowledge: denies epistaxis, gingivitis, hemoptysis, hematemesis, hematuria, melena, excessive bruising, blood donation.    She denies dental concerns, upcoming dental work or jaw pain.   She continues to have peripheral lymphedema of her left leg.   Currently, I would say that her performance status is probably ECOG 1.   Wt Readings from Last 3 Encounters:  07/28/23 143 lb (64.9 kg)  06/16/23 144 lb 1.3 oz (65.4 kg)  06/08/23 144 lb 9.6 oz (65.6 kg)   Medications:  Allergies as of 07/28/2023       Reactions   Statins Other (See Comments)   Myalgias and memory problems    Ciprofloxacin Itching, Swelling, Other (See Comments)   Other reaction(s): swelling/redness at injection site        Medication List        Accurate as of July 28, 2023  1:37 PM. If you have any questions, ask your nurse or doctor.          apixaban 5 MG Tabs tablet Commonly known as: ELIQUIS Take 1 tablet (5 mg total) by mouth 2 (two) times daily.   clobetasol ointment 0.05 % Commonly known as: TEMOVATE Apply 1 Application topically 2 (two) times a week.   clopidogrel 75 MG tablet Commonly known as: PLAVIX Take 1 tablet (75 mg total) by mouth daily with breakfast.   diltiazem 300 MG 24 hr capsule Commonly known as: Cardizem CD Take 1 capsule (300 mg total) by mouth daily.   ezetimibe 10 MG tablet Commonly known as: ZETIA Take 10 mg by mouth daily.   furosemide 20 MG tablet Commonly known as: LASIX Take one tablet daily on Monday, Wednesday and Friday   levothyroxine 50 MCG tablet Commonly known as: SYNTHROID Take 1 tablet (50 mcg total) by mouth daily before breakfast.   mometasone 0.1 % cream Commonly known as: ELOCON Apply 1 Application topically daily as needed (to the vulva for irritation and itching).   mupirocin ointment 2 % Commonly known as: BACTROBAN Apply 1 Application topically  2 (two) times daily.   nitroGLYCERIN 0.4 MG SL tablet Commonly known as: NITROSTAT Place 1 tablet (0.4 mg total) under the tongue every 5 (five) minutes as needed for chest pain.   polyethylene glycol 17 g packet Commonly known as: MIRALAX / GLYCOLAX Take 17 g by mouth daily as needed for severe constipation. What changed: when to take this   Repatha SureClick 140 MG/ML Soaj Generic drug: Evolocumab INJECT 1 DOSE UNDER THE SKIN EVERY 14 DAYS   traMADol 50 MG tablet Commonly known as: ULTRAM TAKE 2 TABLETS(100 MG) BY MOUTH TWICE DAILY        Allergies:  Allergies  Allergen Reactions   Statins Other (See Comments)    Myalgias and memory problems    Ciprofloxacin Itching, Swelling and Other (See Comments)    Other reaction(s): swelling/redness at injection site    Past Medical History, Surgical history, Social history, and Family History were reviewed and updated.  Review of Systems: Review of Systems  Constitutional: Negative.   HENT: Negative.    Eyes: Negative.   Respiratory: Negative.    Cardiovascular:  Positive for leg swelling.  Gastrointestinal: Negative.   Genitourinary: Negative.   Musculoskeletal:  Positive for back pain.  Skin: Negative.   Neurological: Negative.   Endo/Heme/Allergies: Negative.   Psychiatric/Behavioral: Negative.       Physical Exam:  height is 5\' 1"  (1.549 m) and weight is 143 lb (64.9 kg). Her oral temperature is 98.2 F (36.8 C). Her blood pressure is 135/46 (abnormal) and her pulse is 60. Her respiration is 18 and oxygen saturation is 100%.   Wt Readings from Last 3 Encounters:  07/28/23 143 lb (64.9 kg)  06/16/23 144 lb 1.3 oz (65.4 kg)  06/08/23 144 lb 9.6 oz (65.6 kg)    Physical Exam Vitals reviewed.  HENT:     Head: Normocephalic and atraumatic.  Eyes:     Pupils: Pupils are equal, round, and reactive to light.  Cardiovascular:     Rate and Rhythm: Normal rate and regular rhythm.     Heart sounds: Normal heart sounds.     Comments: Cardiac exam shows a regular rate and rhythm.  She has a 4/6 systolic ejection murmur.   Pulmonary:     Effort: Pulmonary effort is normal.     Breath sounds: Normal breath sounds.  Abdominal:     General: Bowel sounds are normal.     Palpations: Abdomen is soft.  Musculoskeletal:        General: No tenderness or deformity. Normal range of motion.     Cervical back: Normal range of motion.     Comments: She has significant lymphedema in her legs.  She has probably 3+ in the left leg and 1+ in the right leg. She has pitting edema.  She has decent range of motion of her joints.  Lymphadenopathy:     Cervical: No cervical adenopathy.  Skin:     General: Skin is warm and dry.     Findings: No erythema or rash.  Neurological:     Mental Status: She is alert and oriented to person, place, and time.  Psychiatric:        Behavior: Behavior normal.        Thought Content: Thought content normal.        Judgment: Judgment normal.      Lab Results  Component Value Date   WBC 3.8 (L) 07/28/2023   HGB 8.9 (L) 07/28/2023   HCT 28.0 (L)  07/28/2023   MCV 85.4 07/28/2023   PLT 264 07/28/2023   Lab Results  Component Value Date   FERRITIN 35 06/16/2023   IRON 27 (L) 06/16/2023   TIBC 294 06/16/2023   UIBC 267 06/16/2023   IRONPCTSAT 9 (L) 06/16/2023   Lab Results  Component Value Date   RETICCTPCT 1.1 07/28/2023   RBC 3.26 (L) 07/28/2023   No results found for: "KPAFRELGTCHN", "LAMBDASER", "KAPLAMBRATIO" No results found for: "IGGSERUM", "IGA", "IGMSERUM" No results found for: "TOTALPROTELP", "ALBUMINELP", "A1GS", "A2GS", "BETS", "BETA2SER", "GAMS", "MSPIKE", "SPEI"   Chemistry      Component Value Date/Time   NA 140 07/28/2023 1132   NA 136 (A) 09/08/2017 0000   K 4.0 07/28/2023 1132   CL 105 07/28/2023 1132   CO2 27 07/28/2023 1132   BUN 15 07/28/2023 1132   BUN 8 09/08/2017 0000   CREATININE 0.81 07/28/2023 1132   CREATININE 0.69 08/02/2016 1519   GLU 111 09/08/2017 0000      Component Value Date/Time   CALCIUM 9.4 07/28/2023 1132   ALKPHOS 66 07/28/2023 1132   AST 13 (L) 07/28/2023 1132   ALT 6 07/28/2023 1132   BILITOT 0.3 07/28/2023 1132      Encounter Diagnoses  Name Primary?   Normocytic anemia Yes   Endometrial cancer (HCC)    Port-A-Cath in place     Impression and Plan: Ms. Mcallister is a very pleasant 83 yo caucasian female with recurrent undifferentiated endometrial carcinoma.  She has Lynch syndrome.  She was on immunotherapy.  She tolerated this incredibly well.  It worked incredibly well. This has been on hold per pt request. She is on Zometa but is overdue for this infusion.   She wishes  to hold off on her Zometa today as she does not like to get two infusions on the same day and wishes to have her IV iron instead. Her Hgb is lower again today so we will recheck iron studies and B12. No CKD to suggest EPO deficiency. She denies any bleeding.   Again I have asked that she discuss her non-healing lower left leg wound with her PCP. I have again requested that she reach out to her PCP about a wound car consult if they feel that this is appropriate. Today her daughter states that she will help facilitate this.   IV iron today IV iron next week RTC 4 weeks MD only, labs(CBC w/, CMP, LDH, retic, iron, ferritin), Zometa   Rushie Chestnut, PA-C 2/27/20251:37 PM

## 2023-07-28 NOTE — Patient Instructions (Signed)

## 2023-07-29 ENCOUNTER — Ambulatory Visit: Payer: Medicare Other | Admitting: Adult Health

## 2023-08-02 ENCOUNTER — Encounter: Payer: Self-pay | Admitting: Medical Oncology

## 2023-08-04 ENCOUNTER — Inpatient Hospital Stay: Attending: Hematology & Oncology

## 2023-08-04 VITALS — BP 147/68 | HR 72 | Temp 98.3°F | Resp 20

## 2023-08-04 DIAGNOSIS — Z90710 Acquired absence of both cervix and uterus: Secondary | ICD-10-CM | POA: Insufficient documentation

## 2023-08-04 DIAGNOSIS — Z9079 Acquired absence of other genital organ(s): Secondary | ICD-10-CM | POA: Diagnosis not present

## 2023-08-04 DIAGNOSIS — Z90722 Acquired absence of ovaries, bilateral: Secondary | ICD-10-CM | POA: Diagnosis not present

## 2023-08-04 DIAGNOSIS — L292 Pruritus vulvae: Secondary | ICD-10-CM | POA: Insufficient documentation

## 2023-08-04 DIAGNOSIS — Z8542 Personal history of malignant neoplasm of other parts of uterus: Secondary | ICD-10-CM | POA: Diagnosis present

## 2023-08-04 DIAGNOSIS — Z923 Personal history of irradiation: Secondary | ICD-10-CM | POA: Diagnosis not present

## 2023-08-04 DIAGNOSIS — N9089 Other specified noninflammatory disorders of vulva and perineum: Secondary | ICD-10-CM | POA: Insufficient documentation

## 2023-08-04 DIAGNOSIS — I2510 Atherosclerotic heart disease of native coronary artery without angina pectoris: Secondary | ICD-10-CM | POA: Insufficient documentation

## 2023-08-04 DIAGNOSIS — D509 Iron deficiency anemia, unspecified: Secondary | ICD-10-CM | POA: Diagnosis not present

## 2023-08-04 DIAGNOSIS — Z1509 Genetic susceptibility to other malignant neoplasm: Secondary | ICD-10-CM | POA: Insufficient documentation

## 2023-08-04 DIAGNOSIS — Z9221 Personal history of antineoplastic chemotherapy: Secondary | ICD-10-CM | POA: Diagnosis not present

## 2023-08-04 DIAGNOSIS — Z79899 Other long term (current) drug therapy: Secondary | ICD-10-CM | POA: Insufficient documentation

## 2023-08-04 DIAGNOSIS — C561 Malignant neoplasm of right ovary: Secondary | ICD-10-CM | POA: Insufficient documentation

## 2023-08-04 DIAGNOSIS — C541 Malignant neoplasm of endometrium: Secondary | ICD-10-CM

## 2023-08-04 DIAGNOSIS — D508 Other iron deficiency anemias: Secondary | ICD-10-CM

## 2023-08-04 MED ORDER — HEPARIN SOD (PORK) LOCK FLUSH 100 UNIT/ML IV SOLN
500.0000 [IU] | Freq: Once | INTRAVENOUS | Status: AC
  Administered 2023-08-04: 500 [IU] via INTRAVENOUS

## 2023-08-04 MED ORDER — SODIUM CHLORIDE 0.9 % IV SOLN: Freq: Once | INTRAVENOUS | Status: AC

## 2023-08-04 MED ORDER — SODIUM CHLORIDE 0.9% FLUSH
10.0000 mL | INTRAVENOUS | Status: DC | PRN
  Administered 2023-08-04: 10 mL via INTRAVENOUS

## 2023-08-04 MED ORDER — IRON SUCROSE 20 MG/ML IV SOLN
200.0000 mg | Freq: Once | INTRAVENOUS | Status: AC
  Administered 2023-08-04: 200 mg via INTRAVENOUS
  Filled 2023-08-04: qty 10

## 2023-08-04 NOTE — Patient Instructions (Signed)

## 2023-08-05 ENCOUNTER — Ambulatory Visit: Payer: Medicare Other | Attending: Emergency Medicine | Admitting: Emergency Medicine

## 2023-08-05 ENCOUNTER — Telehealth: Payer: Self-pay | Admitting: *Deleted

## 2023-08-05 ENCOUNTER — Encounter: Payer: Self-pay | Admitting: Emergency Medicine

## 2023-08-05 VITALS — BP 140/80 | HR 78 | Ht 61.0 in | Wt 141.0 lb

## 2023-08-05 DIAGNOSIS — I350 Nonrheumatic aortic (valve) stenosis: Secondary | ICD-10-CM | POA: Diagnosis not present

## 2023-08-05 DIAGNOSIS — E785 Hyperlipidemia, unspecified: Secondary | ICD-10-CM

## 2023-08-05 DIAGNOSIS — I480 Paroxysmal atrial fibrillation: Secondary | ICD-10-CM | POA: Diagnosis not present

## 2023-08-05 DIAGNOSIS — Z951 Presence of aortocoronary bypass graft: Secondary | ICD-10-CM

## 2023-08-05 DIAGNOSIS — I25709 Atherosclerosis of coronary artery bypass graft(s), unspecified, with unspecified angina pectoris: Secondary | ICD-10-CM

## 2023-08-05 DIAGNOSIS — I5032 Chronic diastolic (congestive) heart failure: Secondary | ICD-10-CM

## 2023-08-05 NOTE — Telephone Encounter (Signed)
 Spoke with [name] who called the office complaining of vulva irritation and she noticed yesterday a wet area on her underpants and realized the area of irritation is bleeding. Pt denies fever, chills and all urinary symptoms. Pt also denies vaginal discharge.  Pt was last seen by  Dr. Tamela Oddi on 06/08/2023 and was advised to return for a vulva punch biopsy. Pt at that time didn't want to have the biopsy until a later date. Pt was called by the office on 06/27/23 to schedule an appt. And call was never returned.   Advised patient that the message would be relayed to providers and the office would call back to get her scheduled for a follow up appt.

## 2023-08-05 NOTE — Telephone Encounter (Signed)
 Spoke with [name] and relayed message from Warner Mccreedy, NP that pt needs to be using a peri bottle and rinsing after using the bathroom, pat the area dry and don't wipe. If wearing pads, changed them often and let the skin air dry as much as possible. Also recommending using a barrier cream/ointment like Vaseline on the skin. Monitor the spotting and call for changes especially heavier bleeding. We have you scheduled with Dr. Karen Kitchens on Wednesday, March 26 th. At 1:15 pm. Pt verbalizes understanding and agrees to appt. On 3/26.

## 2023-08-05 NOTE — Progress Notes (Signed)
 Cardiology Office Note:    Date:  08/05/2023  ID:  Shannon Scott, DOB Jan 23, 1941, MRN [id number] PCP: Shannon Ishihara, MD  [facility] Providers Cardiologist:  Shannon Nose, MD       Patient Profile:      Chief Complaint: 82-month follow-up for CAD  History of Present Illness:  Shannon Scott is a 83 y.o. female with visit-pertinent history of CAD s/p CABG x 3 with LIMA-LAD, SVG-OM, SVG-RCA in 2005 and s/p DES to SVG-OM 2 in 03/2023, severe aortic stenosis, locally advanced endometrial cancer s/p chemoradiation, immunotherapy, hysterectomy with BSO, colonic mass with partial colectomy and colostomy, prior DVT, PAF on eliquis, carotid artery stenosis, CVA, HTN, HLD, statin intolerance, lymphedema, asthma, hypothyroidism.   She s/p CABG x 3 in 2005.  Stress test in 2013 was negative for ischemia.  She was hospitalized in October 2021 in the setting of acute left MCA infarct.  Carotid artery ultrasound 05/2015 40-59% right ICA stenosis and 60-79% left ICA stenosis and greater than 50% left subclavian stenosis.  She was admitted 03/01/23 with NSTEMI. LHC performed and showed diffuse disease in the native vessels as well as both SVG grafts. Medical management was recommended and she was referred to the structural heart team for severe AS. She returned to the ED on 03/09/23 with inferior STEMI ultimately treated wtih PCI/DES-SVG-OM. Otherwise stable extensive disease including tandem lesions in the SVG-dRCA of 70% followed by 70%.  Echocardiogram showed EF 55 to 60%, RWMA, mild hypokinesis of the left ventricular, basal mid anterolateral wall, moderate mitral valve regurgitation, moderate aortic stenosis.  Unfortunately she developed radial PSA, this was medically managed by [facility]. PSA found to have spontaneously thrombosed, no intervention but with recommendations for DAPT with ASA/plavix for a total of 2 weeks followed by Plavix and Eliquis thereafter.   She was last seen in clinic  on 03/25/2023.  She noted after discharge from the hospital she had become short of breath so she self discontinued carvedilol at home and her shortness of breath spontaneously resolved.  In clinic her diltiazem was increased from 180 mg to 300 mg in response to carvedilol being discontinued.  She was doing well overall with no further acute complaints and referred to lipid clinic for an LDL of 111 on Repatha and Zetia however she did not follow-up.  Discussed the use of AI scribe software for clinical note transcription with the patient, who gave verbal consent to proceed.  The patient comes into clinic today with her son who is her primary caregiver and presents for a routine follow-up. The patient reports no cardiovascular concerns or complaints at this time or over the past 4 months.  She denies any chest pain, exertional angina, shortness of breath, syncope, falls, lightheadedness, dizziness or irregular heartbeats. Surprisingly she notes she feels "fine" and doing "well".   She notes that she does exercise at home.  She does not require any assistance to move in and around her home.  She does note that she is adhering to a strictly heart healthy diet.  She has been managing her lymphedema with pumps and gauze dressings. The patient has been adhering to her Eliquis and Plavix and reports no bleeding concerns, though she does note easy bruising. She has also been receiving iron infusions for anemia, which she reports have improved her energy levels. The patient's caregiver mentions that the patient had two teeth extracted recently, which has helped her eat better and healthier since last time we have seen  her.    Review of systems:  Please see the history of present illness. All other systems are reviewed and otherwise negative.     Home Medications:    Current Meds  Medication Sig   apixaban (ELIQUIS) 5 MG TABS tablet Take 1 tablet (5 mg total) by mouth 2 (two) times daily.   clobetasol  ointment (TEMOVATE) 0.05 % Apply 1 Application topically 2 (two) times a week.   clopidogrel (PLAVIX) 75 MG tablet Take 1 tablet (75 mg total) by mouth daily with breakfast.   diltiazem (CARDIZEM CD) 300 MG 24 hr capsule Take 1 capsule (300 mg total) by mouth daily.   ezetimibe (ZETIA) 10 MG tablet Take 10 mg by mouth daily.   furosemide (LASIX) 20 MG tablet Take one tablet daily on Monday, Wednesday and Friday   levothyroxine (SYNTHROID, LEVOTHROID) 50 MCG tablet Take 1 tablet (50 mcg total) by mouth daily before breakfast.   mometasone (ELOCON) 0.1 % cream Apply 1 Application topically daily as needed (to the vulva for irritation and itching).   mupirocin ointment (BACTROBAN) 2 % Apply 1 Application topically 2 (two) times daily.   nitroGLYCERIN (NITROSTAT) 0.4 MG SL tablet Place 1 tablet (0.4 mg total) under the tongue every 5 (five) minutes as needed for chest pain.   polyethylene glycol (MIRALAX / GLYCOLAX) 17 g packet Take 17 g by mouth daily as needed for severe constipation. (Patient taking differently: Take 17 g by mouth daily.)   REPATHA SURECLICK 140 MG/ML SOAJ INJECT 1 DOSE UNDER THE SKIN EVERY 14 DAYS   traMADol (ULTRAM) 50 MG tablet TAKE 2 TABLETS(100 MG) BY MOUTH TWICE DAILY   Studies Reviewed:       Echocardiogram 03/09/2023 1. Left ventricular ejection fraction, by estimation, is 55 to 60%. The  left ventricle has normal function. The left ventricle demonstrates  regional wall motion abnormalities (see scoring diagram/findings for  description). There is mild hypokinesis of  the left ventricular, basal-mid anterolateral wall.   2. The mitral valve is degenerative. Moderate mitral valve regurgitation.  No evidence of mitral stenosis. Severe mitral annular calcification.   3. Tricuspid valve regurgitation is mild to moderate.   4. The aortic valve is calcified. There is moderate calcification of the  aortic valve. There is moderate thickening of the aortic valve. Aortic  valve  regurgitation is trivial. Moderate aortic valve stenosis.   5. There is mildly elevated pulmonary artery systolic pressure.   6. The inferior vena cava is normal in size with greater than 50%  respiratory variability, suggesting right atrial pressure of 3 mmHg.   Cardiac catheterization 03/09/2023 1.  Known severe native vessel coronary artery disease, native coronary arteries not selectively imaged this procedure 2.  Known patency of the LIMA to LAD, this graft not selectively imaged this procedure 3.  Severe diffuse SVG to RCA disease, medical therapy recommended 4.  Acute total occlusion of a severely diseased saphenous vein graft to OM, treated successfully with primary PCI using a 3.0 x 12 mm Synergy DES Diagnostic Dominance: Right  Intervention   Risk Assessment/Calculations:    CHA2DS2-VASc Score = 8   This indicates a 10.8% annual risk of stroke. The patient's score is based upon: CHF History: 1 HTN History: 1 Diabetes History: 0 Stroke History: 2 Vascular Disease History: 1 Age Score: 2 Gender Score: 1    HYPERTENSION CONTROL Vitals:   08/05/23 1502  BP: (!) 140/80    The patient's blood pressure is elevated above target today.  In order to address the patient's elevated BP: Blood pressure will be monitored at home to determine if medication changes need to be made.          Physical Exam:   VS:  BP (!) 140/80 (BP Location: Left Arm, Patient Position: Sitting, Cuff Size: Normal)   Pulse 78   Ht 5\' 1"  (1.549 m)   Wt 141 lb (64 kg)   BMI 26.64 kg/m    Wt Readings from Last 3 Encounters:  08/05/23 141 lb (64 kg)  07/28/23 143 lb (64.9 kg)  06/16/23 144 lb 1.3 oz (65.4 kg)    GEN: Well nourished, well developed in no acute distress NECK: No JVD; No carotid bruits CARDIAC: RRR, no murmurs, rubs, gallops RESPIRATORY:  Clear to auscultation without rales, wheezing or rhonchi  ABDOMEN: Soft, non-tender, distended EXTREMITIES: Lymphedema to bilateral lower  extremities L>R; No acute deformity      Assessment and Plan:  CAD s/p CABGx 3 in 2005 (LIMA-LAD, SVG-OM, SVG-RCA)  H/o inferior STEMI in 03/2023 w/ PCI/DES to SVG-OM2.  Known patency of LIMA-LAD and severe diffuse SVG-dRCA disease including tandem lesions of 70% followed by a 70%, where medical therapy was recommended. -Today and over the past 4 months she has been without any anginal symptoms, no indication for further ischemic evaluation at this time -Continue Plavix 75 mg daily.  She is without any bleeding concerns -Also managed on Eliquis 5 mg BID for history of PAF -Continue Repatha 140 mg q14 days and Zetia 10 mg daily   Hyperlipidemia with LDL goal < 55 Lipoprotein (a) 109.1 on 03/2023 LDL 111, TC 173, TG 70 on 56/2130 History of statin intolerance Recommendations after admission 03/2023 were to continue Repatha and switch Zetia to Nexlizet while she waits to get into lipid clinic for inclisiran.  Nexlizet was ordered, but due to high co-pay was never started.  During last OV she was referred to lipid clinic however lost to follow-up -Will order direct LDL today as she has ate and has difficulty coming in for labs  -If LDL remains above goal will refer to Lipid clinic for further recommendations  -Continue Repatha 140 mg q14 days and Zetia 10mg  daily   Chronic HFpEF Echocardiogram 03/2023 with preserved EF 55-60% with mild hypokinesis of left ventricular basal-mid anterolateral wall Hx of LE lymphedema otherwise pt appears euvolemic and well compensated NYHA class II  History of SOB on carvedilol, defer initiation of alternate BB therapy at this time -Continue Lasix 20mg  on MWF -Continue daily weights, <2g low sodium diet   Severe aortic stenosis Per Dr. Elissa Hefty note on 03/11/2023: "(Per Dr. Excell Seltzer - as part of Structural Heart Team: Pt did well with PCI of the SVG-OM today after presenting with STEMI. We had discussed her case yesterday in our valve team meeting. I told her  family that she will not be a candidate for TAVR with her MI and high risk of recurrent MI as well as her other comorbidities.) For now we will just simply continue to monitor and treat medically" -Today pt is overall asymptomatic and doing well from a cardiac standpoint. Continues to exercise at home with limitation  -Continue Lasix 20mg  MWF    Paroxysmal artrial fibrillation  Maintaining NSR by auscultation  History of SOB on beta-blocking therapy.  She self discontinued carvedilol in the setting of SOB after 03/2023 admission.  During last OV her Cardizem was increased from 180 mg daily to 300 mg daily. -Today she is rate controlled  -  She denies any symptoms of recurrent AFib -Continue Cardizem 300 mg daily -Continue Eliquis 5mg  BID  Endometrial Cancer /normocytic anemia Managed by oncology           Dispo:  Return in about 6 months (around 02/05/2024).  Signed, Denyce Robert, NP

## 2023-08-05 NOTE — Patient Instructions (Addendum)
 Medication Instructions:  NO CHANGES   Lab Work: DIRECT LDL TO BE DONE TODAY.    Testing/Procedures: NONE   Follow-Up: At [facility], you and your health needs are our priority.  As part of our continuing mission to provide you with exceptional heart care, we have created designated Provider Care Teams.  These Care Teams include your primary Cardiologist (physician) and Advanced Practice Providers (APPs -  Physician Assistants and Nurse Practitioners) who all work together to provide you with the care you need, when you need it.   Your next appointment:   6 month(s)  Provider:   DR. HILTY OR MADISON FOUNTAIN  Other Instructions:

## 2023-08-06 LAB — LDL CHOLESTEROL, DIRECT: LDL Direct: 103 mg/dL — ABNORMAL HIGH (ref 0–99)

## 2023-08-08 ENCOUNTER — Other Ambulatory Visit: Payer: Self-pay | Admitting: Hematology & Oncology

## 2023-08-08 DIAGNOSIS — G8929 Other chronic pain: Secondary | ICD-10-CM

## 2023-08-08 DIAGNOSIS — M792 Neuralgia and neuritis, unspecified: Secondary | ICD-10-CM

## 2023-08-08 DIAGNOSIS — C541 Malignant neoplasm of endometrium: Secondary | ICD-10-CM

## 2023-08-08 DIAGNOSIS — M545 Low back pain, unspecified: Secondary | ICD-10-CM

## 2023-08-09 ENCOUNTER — Telehealth: Payer: Self-pay | Admitting: *Deleted

## 2023-08-09 DIAGNOSIS — E785 Hyperlipidemia, unspecified: Secondary | ICD-10-CM

## 2023-08-09 NOTE — Telephone Encounter (Signed)
 Left a voice message letting the patient know that her LDL cholesterol is 103 which remains above goal of less than 70. We will refer you back to our lipid clinic for further recommendations on cholesterol-lowering therapy. Per [facility]. Referral has been sent to the lipid clinic.

## 2023-08-12 ENCOUNTER — Telehealth: Payer: Self-pay | Admitting: Pharmacy Technician

## 2023-08-12 ENCOUNTER — Other Ambulatory Visit (HOSPITAL_COMMUNITY): Payer: Self-pay

## 2023-08-12 NOTE — Telephone Encounter (Signed)
 Pharmacy Patient Advocate Encounter   Received notification from CoverMyMeds that prior authorization for Nexletol is required/requested.   Insurance verification completed.   The patient is insured through [employer] .   Per test claim: The current 08/12/23 day co-pay is, $47.00- one month.  No PA needed at this time. This test claim was processed through [facility]- copay amounts may vary at other pharmacies due to pharmacy/plan contracts, or as the patient moves through the different stages of their insurance plan.     PA extended 06/01/23 until 05/30/24

## 2023-08-17 ENCOUNTER — Other Ambulatory Visit: Payer: Self-pay | Admitting: Internal Medicine

## 2023-08-17 ENCOUNTER — Other Ambulatory Visit: Payer: Self-pay

## 2023-08-19 ENCOUNTER — Telehealth: Payer: Self-pay | Admitting: Emergency Medicine

## 2023-08-19 DIAGNOSIS — L0390 Cellulitis, unspecified: Secondary | ICD-10-CM | POA: Diagnosis not present

## 2023-08-19 DIAGNOSIS — R54 Age-related physical debility: Secondary | ICD-10-CM | POA: Diagnosis not present

## 2023-08-19 DIAGNOSIS — I2510 Atherosclerotic heart disease of native coronary artery without angina pectoris: Secondary | ICD-10-CM | POA: Diagnosis not present

## 2023-08-19 DIAGNOSIS — I890 Lymphedema, not elsewhere classified: Secondary | ICD-10-CM | POA: Diagnosis not present

## 2023-08-19 DIAGNOSIS — I10 Essential (primary) hypertension: Secondary | ICD-10-CM | POA: Diagnosis not present

## 2023-08-19 NOTE — Telephone Encounter (Signed)
 Patient mailed in [facility] start form. Will leave in provider box.

## 2023-08-22 NOTE — Telephone Encounter (Signed)
 Received signed [facility] Scott form, filled out our portion and faxed (with insurance cards) to [facility]

## 2023-08-24 ENCOUNTER — Inpatient Hospital Stay

## 2023-08-24 ENCOUNTER — Inpatient Hospital Stay: Admitting: Obstetrics & Gynecology

## 2023-08-24 ENCOUNTER — Other Ambulatory Visit: Payer: Self-pay

## 2023-08-24 VITALS — BP 142/72 | HR 82 | Temp 98.4°F | Resp 18 | Wt 140.8 lb

## 2023-08-24 DIAGNOSIS — Z923 Personal history of irradiation: Secondary | ICD-10-CM | POA: Diagnosis not present

## 2023-08-24 DIAGNOSIS — Z8542 Personal history of malignant neoplasm of other parts of uterus: Secondary | ICD-10-CM | POA: Diagnosis not present

## 2023-08-24 DIAGNOSIS — Z9221 Personal history of antineoplastic chemotherapy: Secondary | ICD-10-CM | POA: Diagnosis not present

## 2023-08-24 DIAGNOSIS — D509 Iron deficiency anemia, unspecified: Secondary | ICD-10-CM | POA: Diagnosis not present

## 2023-08-24 DIAGNOSIS — C541 Malignant neoplasm of endometrium: Secondary | ICD-10-CM

## 2023-08-24 DIAGNOSIS — N9089 Other specified noninflammatory disorders of vulva and perineum: Secondary | ICD-10-CM | POA: Diagnosis not present

## 2023-08-24 DIAGNOSIS — L292 Pruritus vulvae: Secondary | ICD-10-CM | POA: Diagnosis not present

## 2023-08-24 DIAGNOSIS — Z79899 Other long term (current) drug therapy: Secondary | ICD-10-CM | POA: Diagnosis not present

## 2023-08-24 DIAGNOSIS — I2510 Atherosclerotic heart disease of native coronary artery without angina pectoris: Secondary | ICD-10-CM | POA: Diagnosis not present

## 2023-08-24 LAB — WET PREP, GENITAL
Clue Cells Wet Prep HPF POC: NONE SEEN
Sperm: NONE SEEN
Trich, Wet Prep: NONE SEEN
WBC, Wet Prep HPF POC: <10 (ref <10)
Yeast Wet Prep HPF POC: NONE SEEN

## 2023-08-24 NOTE — Patient Instructions (Signed)
 Hydrocortisone 1 % Return in 2 weeks

## 2023-08-25 ENCOUNTER — Encounter: Payer: Self-pay | Admitting: Hematology & Oncology

## 2023-08-25 ENCOUNTER — Inpatient Hospital Stay (HOSPITAL_BASED_OUTPATIENT_CLINIC_OR_DEPARTMENT_OTHER): Admitting: Hematology & Oncology

## 2023-08-25 ENCOUNTER — Inpatient Hospital Stay

## 2023-08-25 VITALS — BP 161/51 | HR 78 | Temp 98.4°F | Resp 20 | Ht 61.0 in | Wt 139.0 lb

## 2023-08-25 DIAGNOSIS — Z79899 Other long term (current) drug therapy: Secondary | ICD-10-CM | POA: Diagnosis not present

## 2023-08-25 DIAGNOSIS — C541 Malignant neoplasm of endometrium: Secondary | ICD-10-CM | POA: Diagnosis not present

## 2023-08-25 DIAGNOSIS — Z923 Personal history of irradiation: Secondary | ICD-10-CM | POA: Diagnosis not present

## 2023-08-25 DIAGNOSIS — I2510 Atherosclerotic heart disease of native coronary artery without angina pectoris: Secondary | ICD-10-CM | POA: Diagnosis not present

## 2023-08-25 DIAGNOSIS — D509 Iron deficiency anemia, unspecified: Secondary | ICD-10-CM | POA: Diagnosis not present

## 2023-08-25 DIAGNOSIS — I480 Paroxysmal atrial fibrillation: Secondary | ICD-10-CM | POA: Diagnosis not present

## 2023-08-25 DIAGNOSIS — E039 Hypothyroidism, unspecified: Secondary | ICD-10-CM | POA: Diagnosis not present

## 2023-08-25 DIAGNOSIS — D649 Anemia, unspecified: Secondary | ICD-10-CM

## 2023-08-25 DIAGNOSIS — Z8542 Personal history of malignant neoplasm of other parts of uterus: Secondary | ICD-10-CM | POA: Diagnosis not present

## 2023-08-25 DIAGNOSIS — Z9221 Personal history of antineoplastic chemotherapy: Secondary | ICD-10-CM | POA: Diagnosis not present

## 2023-08-25 LAB — CMP (CANCER CENTER ONLY)
ALT: 6 U/L (ref 0–44)
AST: 13 U/L — ABNORMAL LOW (ref 15–41)
Albumin: 3.9 g/dL (ref 3.5–5.0)
Alkaline Phosphatase: 75 U/L (ref 38–126)
Anion gap: 7 (ref 5–15)
BUN: 16 mg/dL (ref 8–23)
CO2: 29 mmol/L (ref 22–32)
Calcium: 9.2 mg/dL (ref 8.9–10.3)
Chloride: 104 mmol/L (ref 98–111)
Creatinine: 0.83 mg/dL (ref 0.44–1.00)
GFR, Estimated: >60 mL/min (ref >60)
Glucose, Bld: 108 mg/dL — ABNORMAL HIGH (ref 70–99)
Potassium: 4 mmol/L (ref 3.5–5.1)
Sodium: 140 mmol/L (ref 135–145)
Total Bilirubin: 0.2 mg/dL (ref 0.0–1.2)
Total Protein: 6.2 g/dL — ABNORMAL LOW (ref 6.5–8.1)

## 2023-08-25 LAB — CBC WITH DIFFERENTIAL (CANCER CENTER ONLY)
Abs Immature Granulocytes: 0.05 10*3/uL (ref 0.00–0.07)
Basophils Absolute: 0 10*3/uL (ref 0.0–0.1)
Basophils Relative: 0 %
Eosinophils Absolute: 0 10*3/uL (ref 0.0–0.5)
Eosinophils Relative: 1 %
HCT: 29.1 % — ABNORMAL LOW (ref 36.0–46.0)
Hemoglobin: 9.1 g/dL — ABNORMAL LOW (ref 12.0–15.0)
Immature Granulocytes: 1 %
Lymphocytes Relative: 15 %
Lymphs Abs: 0.7 10*3/uL (ref 0.7–4.0)
MCH: 26.9 pg (ref 26.0–34.0)
MCHC: 31.3 g/dL (ref 30.0–36.0)
MCV: 86.1 fL (ref 80.0–100.0)
Monocytes Absolute: 0.3 10*3/uL (ref 0.1–1.0)
Monocytes Relative: 6 %
Neutro Abs: 3.6 10*3/uL (ref 1.7–7.7)
Neutrophils Relative %: 77 %
Platelet Count: 256 10*3/uL (ref 150–400)
RBC: 3.38 MIL/uL — ABNORMAL LOW (ref 3.87–5.11)
RDW: 16.4 % — ABNORMAL HIGH (ref 11.5–15.5)
WBC Count: 4.7 10*3/uL (ref 4.0–10.5)
nRBC: 0 % (ref 0.0–0.2)

## 2023-08-25 LAB — RETIC PANEL
Immature Retic Fract: 11 % (ref 2.3–15.9)
RBC.: 3.38 MIL/uL — ABNORMAL LOW (ref 3.87–5.11)
Retic Count, Absolute: 38.9 10*3/uL (ref 19.0–186.0)
Retic Ct Pct: 1.2 % (ref 0.4–3.1)
Reticulocyte Hemoglobin: 29.5 pg (ref >27.9)

## 2023-08-25 LAB — FERRITIN: Ferritin: 117 ng/mL (ref 11–307)

## 2023-08-25 LAB — SAVE SMEAR(SSMR), FOR PROVIDER SLIDE REVIEW

## 2023-08-25 LAB — LACTATE DEHYDROGENASE: LDH: 133 U/L (ref 98–192)

## 2023-08-25 LAB — SURGICAL PATHOLOGY

## 2023-08-25 MED ORDER — SODIUM CHLORIDE 0.9% FLUSH
10.0000 mL | INTRAVENOUS | Status: DC | PRN
Start: 2023-08-25 — End: 2023-08-25
  Administered 2023-08-25: 10 mL via INTRAVENOUS

## 2023-08-25 MED ORDER — HEPARIN SOD (PORK) LOCK FLUSH 100 UNIT/ML IV SOLN
500.0000 [IU] | Freq: Once | INTRAVENOUS | Status: AC
  Administered 2023-08-25: 500 [IU] via INTRAVENOUS

## 2023-08-25 NOTE — Patient Instructions (Signed)

## 2023-08-25 NOTE — Progress Notes (Signed)
 Hematology and Oncology Follow Up Visit  Shannon Scott [id number] [birth year] 83 y.o. 08/25/2023   Principle Diagnosis:  Locally advanced/recurrent endometrial carcinoma undifferentiated --  TMB (HIGH) / MSI HIGH  Intermittent iron deficiency anemia   Past Therapy: Radiation therapy for 5 -5 1/2 weeks - s/p 20 fractions Taxol/carboplatinum q 7 days -- s/p cycle 6    Current Therapy:        Pembrolizumab 400 mg q 12 weeks, s/p cycle 20 (originally started on 01/23/2019 at 400 mg IV q 6 wk)  --on hold for patient request since April 2022 Zometa 4 mg IV q 12 months -- next dose due 09/2023 IV iron as indicated --Venofer - last infusion was on 08/04/2023       Interim History:  Shannon Scott is here today for follow-up.  She seems to be managing pretty well.  She got iron back in early March.  At that time, her iron saturation was only 9%.  I think that she is being followed quite closely by cardiology because of her coronary artery disease.  She continues on her anticoagulation.  I think she is on Eliquis, Plavix and aspirin.  She has had no problems with cough.  She has had no abdominal pain.  She has a lot of swelling in the left leg.  I noted this to be followed by her family doctor.  She has had no issues with fever.  There is been no problems with COVID or Influenza.  Her last CA-125 was 12.6.  Overall, I would say that her performance status is probably ECOG 2.   Wt Readings from Last 3 Encounters:  08/25/23 139 lb (63 kg)  08/24/23 140 lb 12.8 oz (63.9 kg)  08/05/23 141 lb (64 kg)   Medications:  Allergies as of 08/25/2023       Reactions   Statins Other (See Comments)   Myalgias and memory problems   Ciprofloxacin Itching, Swelling, Other (See Comments)   Other reaction(s): swelling/redness at injection site        Medication List        Accurate as of August 25, 2023  2:07 PM. If you have any questions, ask your nurse or doctor.          apixaban 5  MG Tabs tablet Commonly known as: ELIQUIS Take 1 tablet (5 mg total) by mouth 2 (two) times daily.   cephALEXin 500 MG capsule Commonly known as: KEFLEX 4 (four) times daily.   clobetasol ointment 0.05 % Commonly known as: TEMOVATE Apply 1 Application topically 2 (two) times a week.   clopidogrel 75 MG tablet Commonly known as: PLAVIX Take 1 tablet (75 mg total) by mouth daily with breakfast.   diltiazem 300 MG 24 hr capsule Commonly known as: Cardizem CD Take 1 capsule (300 mg total) by mouth daily.   ezetimibe 10 MG tablet Commonly known as: ZETIA Take 10 mg by mouth daily.   furosemide 20 MG tablet Commonly known as: LASIX Take 1 tablet (20 mg total) by mouth every Monday, Wednesday, and Friday.   levothyroxine 50 MCG tablet Commonly known as: SYNTHROID Take 1 tablet (50 mcg total) by mouth daily before breakfast.   mometasone 0.1 % cream Commonly known as: ELOCON Apply 1 Application topically daily as needed (to the vulva for irritation and itching).   mupirocin ointment 2 % Commonly known as: BACTROBAN Apply 1 Application topically 2 (two) times daily.   nitroGLYCERIN 0.4 MG SL tablet Commonly known as: NITROSTAT  Place 1 tablet (0.4 mg total) under the tongue every 5 (five) minutes as needed for chest pain.   polyethylene glycol 17 g packet Commonly known as: MIRALAX / GLYCOLAX Take 17 g by mouth daily as needed for severe constipation. What changed: when to take this   Repatha SureClick 140 MG/ML Soaj Generic drug: Evolocumab INJECT 1 DOSE UNDER THE SKIN EVERY 14 DAYS   Tiadylt ER 300 MG 24 hr capsule Generic drug: diltiazem Take 300 mg by mouth daily.   traMADol 50 MG tablet Commonly known as: ULTRAM TAKE 2 TABLETS(100 MG) BY MOUTH TWICE DAILY        Allergies:  Allergies  Allergen Reactions   Statins Other (See Comments)    Myalgias and memory problems   Ciprofloxacin Itching, Swelling and Other (See Comments)    Other reaction(s):  swelling/redness at injection site    Past Medical History, Surgical history, Social history, and Family History were reviewed and updated.  Review of Systems: Review of Systems  Constitutional: Negative.   HENT: Negative.    Eyes: Negative.   Respiratory: Negative.    Cardiovascular:  Positive for leg swelling.  Gastrointestinal: Negative.   Genitourinary: Negative.   Musculoskeletal:  Positive for back pain.  Skin: Negative.   Neurological: Negative.   Endo/Heme/Allergies: Negative.   Psychiatric/Behavioral: Negative.       Physical Exam:  height is 5\' 1"  (1.549 m) and weight is 139 lb (63 kg). Her oral temperature is 98.4 F (36.9 C). Her blood pressure is 161/51 (abnormal) and her pulse is 78. Her respiration is 20 and oxygen saturation is 100%.   Wt Readings from Last 3 Encounters:  08/25/23 139 lb (63 kg)  08/24/23 140 lb 12.8 oz (63.9 kg)  08/05/23 141 lb (64 kg)    Physical Exam Vitals reviewed.  HENT:     Head: Normocephalic and atraumatic.  Eyes:     Pupils: Pupils are equal, round, and reactive to light.  Cardiovascular:     Rate and Rhythm: Normal rate and regular rhythm.     Heart sounds: Normal heart sounds.     Comments: Cardiac exam shows a regular rate and rhythm.  She has a 4/6 systolic ejection murmur.   Pulmonary:     Effort: Pulmonary effort is normal.     Breath sounds: Normal breath sounds.  Abdominal:     General: Bowel sounds are normal.     Palpations: Abdomen is soft.  Musculoskeletal:        General: No tenderness or deformity. Normal range of motion.     Cervical back: Normal range of motion.     Comments: She has significant lymphedema in her legs.  She has probably 3+ in the left leg and 1+ in the right leg. She has pitting edema.  She has decent range of motion of her joints.  Lymphadenopathy:     Cervical: No cervical adenopathy.  Skin:    General: Skin is warm and dry.     Findings: No erythema or rash.  Neurological:      Mental Status: She is alert and oriented to person, place, and time.  Psychiatric:        Behavior: Behavior normal.        Thought Content: Thought content normal.        Judgment: Judgment normal.     Lab Results  Component Value Date   WBC 4.7 08/25/2023   HGB 9.1 (L) 08/25/2023   HCT 29.1 (L) 08/25/2023  MCV 86.1 08/25/2023   PLT 256 08/25/2023   Lab Results  Component Value Date   FERRITIN 77 07/28/2023   IRON 23 (L) 07/28/2023   TIBC 252 07/28/2023   UIBC 229 07/28/2023   IRONPCTSAT 9 (L) 07/28/2023   Lab Results  Component Value Date   RETICCTPCT 1.2 08/25/2023   RBC 3.38 (L) 08/25/2023   RBC 3.38 (L) 08/25/2023   No results found for: "KPAFRELGTCHN", "LAMBDASER", "KAPLAMBRATIO" No results found for: "IGGSERUM", "IGA", "IGMSERUM" No results found for: "TOTALPROTELP", "ALBUMINELP", "A1GS", "A2GS", "BETS", "BETA2SER", "GAMS", "MSPIKE", "SPEI"   Chemistry      Component Value Date/Time   NA 140 07/28/2023 1132   NA 136 (A) 09/08/2017 0000   K 4.0 07/28/2023 1132   CL 105 07/28/2023 1132   CO2 27 07/28/2023 1132   BUN 15 07/28/2023 1132   BUN 8 09/08/2017 0000   CREATININE 0.81 07/28/2023 1132   CREATININE 0.69 08/02/2016 1519   GLU 111 09/08/2017 0000      Component Value Date/Time   CALCIUM 9.4 07/28/2023 1132   ALKPHOS 66 07/28/2023 1132   AST 13 (L) 07/28/2023 1132   ALT 6 07/28/2023 1132   BILITOT 0.3 07/28/2023 1132       Impression and Plan: Ms. Laske is a very pleasant 83 yo caucasian female with recurrent undifferentiated endometrial carcinoma.  She has Lynch syndrome.  She was on immunotherapy.  She tolerated this incredibly well.  It worked incredibly well. This has been on hold per pt request.  I will plan to see what her iron levels are.  Again she is anemic.  Her erythropoietin level was only 10.6.  Unfortunately, because of her cerebrovascular and cardiac disease, I just do not know if we can give her ESA.  Again we will see what her  iron levels are.  Will have her come back in about 6 weeks or so.  Is been years since she had Zometa.  I think this is going to be helpful for her.  I do not see any role for radiologic studies for the endometrial cancer.  I just do not see where this is recurred clinically.     Josph Macho, MD 3/27/20252:07 PM

## 2023-08-26 ENCOUNTER — Encounter: Payer: Self-pay | Admitting: Obstetrics & Gynecology

## 2023-08-26 ENCOUNTER — Encounter: Payer: Self-pay | Admitting: Family

## 2023-08-26 LAB — IRON AND IRON BINDING CAPACITY (CC-WL,HP ONLY)
Iron: 27 ug/dL — ABNORMAL LOW (ref 28–170)
Saturation Ratios: 11 % (ref 10.4–31.8)
TIBC: 246 ug/dL — ABNORMAL LOW (ref 250–450)
UIBC: 219 ug/dL (ref 148–442)

## 2023-08-26 NOTE — Progress Notes (Signed)
 Patient ID: Shannon Scott, female   DOB: 15-May-1941, 83 y.o.   MRN: [id number]  Chief Complaint  Patient presents with   Endometrial cancer [facility])    HPI Shannon Scott is a 83 y.o. female.  She has a chronic vulva itch.  She presents for a punch bx of the vulva.  Medications include a DOAC and antiplatelet med.   HPI Indication: Chronic vulva itch Symptoms:   See above  Location:  See above    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) 10/11/2017   Postoperative nausea and vomiting 01/02/2016    Past Surgical History:  Procedure Laterality Date   ABDOMINAL HYSTERECTOMY     Carotid Doppler  02/2012   40-59% right int carotid artery stenosis; 60-79% L int carotid stenosis; L carotid bruit   CORONARY ARTERY BYPASS GRAFT  03/12/2004   LIMA to LAD, SVG to circumflex, SVG to PDA   CORONARY/GRAFT ACUTE MI REVASCULARIZATION N/A 03/09/2023   Procedure: Coronary/Graft Acute MI Revascularization;  Surgeon: Tonny Bollman, MD;  Location: [facility] INVASIVE CV LAB;  Service: Cardiovascular;  Laterality: N/A;   CORONARY/GRAFT ANGIOGRAPHY N/A 03/02/2023   Procedure: CORONARY/GRAFT ANGIOGRAPHY;  Surgeon: Yates Decamp, MD;  Location: [facility] INVASIVE CV LAB;  Service: Cardiovascular;  Laterality: N/A;   CYSTOSCOPY W/ URETERAL STENT PLACEMENT Left 12/06/2017   Procedure: CYSTOSCOPY WITH LEFT URETERAL STENT EXCHANGE;   Surgeon: Bjorn Pippin, MD;  Location: [facility] ORS;  Service: Urology;  Laterality: Left;   CYSTOSCOPY WITH STENT PLACEMENT Bilateral 08/21/2017   Procedure: CYSTOSCOPY WITH STENT PLACEMENT;  Surgeon: Rene Paci, MD;  Location: [facility] ORS;  Service: Urology;  Laterality: Bilateral;   IR FLUORO GUIDE PORT INSERTION RIGHT  10/11/2017   IR GENERIC HISTORICAL  04/29/2016   IR RADIOLOGIST EVAL & MGMT 04/29/2016 Simonne Come, MD [facility] INTERV RAD   IR GENERIC HISTORICAL  05/12/2016   IR RADIOLOGIST EVAL & MGMT 05/12/2016 Simonne Come, MD [facility] INTERV RAD   IR IVC FILTER PLMT / S&I Lenise Arena GUID/MOD SED  10/11/2017   IR US GUIDE VASC ACCESS RIGHT  10/11/2017   ROBOTIC ASSISTED TOTAL HYSTERECTOMY WITH BILATERAL SALPINGO OOPHERECTOMY Bilateral 12/30/2015   Procedure: XI ROBOTIC ASSISTED TOTAL HYSTERECTOMY WITH BILATERAL SALPINGO OOPHORECTOMY WITH SENTAL LYMPH NODE BIOPSY;  Surgeon: Cleda Mccreedy, MD;  Location: [facility];  Service: Gynecology;  Laterality: Bilateral;   TONSILLECTOMY     TRANSTHORACIC ECHOCARDIOGRAM  04/2007   EF>55%; mild MR; mild-mod TR; mild pulm HTN; mild calcification of aortiv valve leaflets with mild valvular aortic stenosis   TUBAL LIGATION      Family History  Problem Relation Age of Onset   Hypertension Mother    Heart disease Mother        Died in her 81s   Stroke Father    Kidney disease Brother  Heart disease Brother        also HTN, hyperlipidemia   Heart attack Brother    Stroke Sister        x2   Hypertension Sister     Social History Social History   Tobacco Use   Smoking status: Never   Smokeless tobacco: Never  Vaping Use   Vaping status: Never Used  Substance Use Topics   Alcohol use: No   Drug use: No    Allergies  Allergen Reactions   Statins Other (See Comments)    Myalgias and memory problems   Ciprofloxacin Itching, Swelling and Other (See Comments)    Other reaction(s): swelling/redness at injection site    Current Outpatient Medications   Medication Sig Dispense Refill   apixaban (ELIQUIS) 5 MG TABS tablet Take 1 tablet (5 mg total) by mouth 2 (two) times daily. 60 tablet 0   cephALEXin (KEFLEX) 500 MG capsule 4 (four) times daily.     clobetasol ointment (TEMOVATE) 0.05 % Apply 1 Application topically 2 (two) times a week. (Patient not taking: Reported on 08/25/2023) 30 g 3   clopidogrel (PLAVIX) 75 MG tablet Take 1 tablet (75 mg total) by mouth daily with breakfast. 90 tablet 2   diltiazem (CARDIZEM CD) 300 MG 24 hr capsule Take 1 capsule (300 mg total) by mouth daily. (Patient not taking: Reported on 08/25/2023) 90 capsule 3   ezetimibe (ZETIA) 10 MG tablet Take 10 mg by mouth daily.     furosemide (LASIX) 20 MG tablet Take 1 tablet (20 mg total) by mouth every Monday, Wednesday, and Friday. 36 tablet 3   levothyroxine (SYNTHROID, LEVOTHROID) 50 MCG tablet Take 1 tablet (50 mcg total) by mouth daily before breakfast. 30 tablet 1   mometasone (ELOCON) 0.1 % cream Apply 1 Application topically daily as needed (to the vulva for irritation and itching). 45 g 0   mupirocin ointment (BACTROBAN) 2 % Apply 1 Application topically 2 (two) times daily. 22 g 0   nitroGLYCERIN (NITROSTAT) 0.4 MG SL tablet Place 1 tablet (0.4 mg total) under the tongue every 5 (five) minutes as needed for chest pain. (Patient not taking: Reported on 08/25/2023) 25 tablet 0   polyethylene glycol (MIRALAX / GLYCOLAX) 17 g packet Take 17 g by mouth daily as needed for severe constipation. (Patient taking differently: Take 17 g by mouth daily.) 14 each 0   REPATHA SURECLICK 140 MG/ML SOAJ INJECT 1 DOSE UNDER THE SKIN EVERY 14 DAYS 6 mL 3   TIADYLT ER 300 MG 24 hr capsule Take 300 mg by mouth daily.     traMADol (ULTRAM) 50 MG tablet TAKE 2 TABLETS(100 MG) BY MOUTH TWICE DAILY 120 tablet 0   No current facility-administered medications for this visit.    Review of Systems Review of Systems  Blood pressure (!) 142/72, pulse 82, temperature 98.4 F (36.9 C),  temperature source Oral, resp. rate 18, weight 140 lb 12.8 oz (63.9 kg), SpO2 100%.  Physical Exam No change from prior exam    Assessment    A time out was performed confirming the procedure, allergy status.  Written consent was obtained.  We discussed the increased bleeding risk on anticoagulant medications.  Prepped with Betadine Local anesthesia with 1% Buffered Lidocaine 3  mm punch biopsy performed per protocol Silver Nitrate applied:  Yes.  A figure-of-eight suture of 4 - 0 Vicryl was placed.  Adequate hemostasis was noted.  The incision was irrigated. The procedure was well tolerated.  Specimen appropriately identified and sent to pathology    Plan   - Review the histology from the bx - Follow-up:  2 weeks      Antionette Char 08/26/2023, 2:54 PM

## 2023-08-29 ENCOUNTER — Other Ambulatory Visit: Payer: Self-pay

## 2023-08-29 ENCOUNTER — Emergency Department (HOSPITAL_BASED_OUTPATIENT_CLINIC_OR_DEPARTMENT_OTHER)
Admission: EM | Admit: 2023-08-29 | Discharge: 2023-08-29 | Disposition: A | Discharge status: Home / Self Care | Attending: Emergency Medicine | Admitting: Emergency Medicine

## 2023-08-29 ENCOUNTER — Encounter (HOSPITAL_BASED_OUTPATIENT_CLINIC_OR_DEPARTMENT_OTHER): Payer: Self-pay | Admitting: Emergency Medicine

## 2023-08-29 DIAGNOSIS — Z7902 Long term (current) use of antithrombotics/antiplatelets: Secondary | ICD-10-CM | POA: Insufficient documentation

## 2023-08-29 DIAGNOSIS — N939 Abnormal uterine and vaginal bleeding, unspecified: Secondary | ICD-10-CM | POA: Diagnosis present

## 2023-08-29 DIAGNOSIS — Z7901 Long term (current) use of anticoagulants: Secondary | ICD-10-CM | POA: Insufficient documentation

## 2023-08-29 DIAGNOSIS — R58 Hemorrhage, not elsewhere classified: Secondary | ICD-10-CM

## 2023-08-29 LAB — CBC WITH DIFFERENTIAL/PLATELET
Abs Immature Granulocytes: 0.01 10*3/uL (ref 0.00–0.07)
Basophils Absolute: 0 10*3/uL (ref 0.0–0.1)
Basophils Relative: 1 %
Eosinophils Absolute: 0.1 10*3/uL (ref 0.0–0.5)
Eosinophils Relative: 1 %
HCT: 30.3 % — ABNORMAL LOW (ref 36.0–46.0)
Hemoglobin: 9.7 g/dL — ABNORMAL LOW (ref 12.0–15.0)
Immature Granulocytes: 0 %
Lymphocytes Relative: 12 %
Lymphs Abs: 0.8 10*3/uL (ref 0.7–4.0)
MCH: 27.2 pg (ref 26.0–34.0)
MCHC: 32 g/dL (ref 30.0–36.0)
MCV: 85.1 fL (ref 80.0–100.0)
Monocytes Absolute: 0.4 10*3/uL (ref 0.1–1.0)
Monocytes Relative: 7 %
Neutro Abs: 5 10*3/uL (ref 1.7–7.7)
Neutrophils Relative %: 79 %
Platelets: 289 10*3/uL (ref 150–400)
RBC: 3.56 MIL/uL — ABNORMAL LOW (ref 3.87–5.11)
RDW: 16.3 % — ABNORMAL HIGH (ref 11.5–15.5)
WBC: 6.3 10*3/uL (ref 4.0–10.5)
nRBC: 0 % (ref 0.0–0.2)

## 2023-08-29 MED ORDER — LIDOCAINE-EPINEPHRINE (PF) 2 %-1:200000 IJ SOLN
10.0000 mL | Freq: Once | INTRAMUSCULAR | Status: AC
  Administered 2023-08-29: 10 mL via INTRADERMAL
  Filled 2023-08-29: qty 20

## 2023-08-29 NOTE — ED Notes (Signed)
Pelvic cart placed at bedside.  

## 2023-08-29 NOTE — ED Notes (Signed)
 Small piece of quick clot placed on biopsied area.  Bleeding controlled

## 2023-08-29 NOTE — ED Notes (Signed)
 Quickclot ordered by EDP to apply to area

## 2023-08-29 NOTE — ED Provider Notes (Signed)
 [place] EMERGENCY DEPARTMENT AT [facility] Provider Note   CSN: [id number] Arrival date & time: 08/29/23  1935     History  Chief Complaint  Patient presents with   Vaginal Bleeding    Shannon Scott is a 83 y.o. female.  83 yo F with a cc of bleeding from a biopsy to the vagina.  This was done yesterday.  She started noticing some significant bleeding.  She went to urgent care and they attempted to get it to stop with silver nitrate.  They were reportedly unsuccessful and then sent her here for evaluation.  She does think it slowed down quite a bit.   Vaginal Bleeding      Home Medications Prior to Admission medications   Medication Sig Start Date End Date Taking? Authorizing Provider  apixaban (ELIQUIS) 5 MG TABS tablet Take 1 tablet (5 mg total) by mouth 2 (two) times daily. 03/29/23   Arty Baumgartner, NP  cephALEXin (KEFLEX) 500 MG capsule 4 (four) times daily. 08/19/23   [provider]  clobetasol ointment (TEMOVATE) 0.05 % Apply 1 Application topically 2 (two) times a week. Patient not taking: Reported on 08/25/2023 06/24/22   Antionette Char, MD  clopidogrel (PLAVIX) 75 MG tablet Take 1 tablet (75 mg total) by mouth daily with breakfast. 03/16/23   Arty Baumgartner, NP  diltiazem (CARDIZEM CD) 300 MG 24 hr capsule Take 1 capsule (300 mg total) by mouth daily. Patient not taking: Reported on 08/25/2023 03/25/23   Denyce Robert, NP  ezetimibe (ZETIA) 10 MG tablet Take 10 mg by mouth daily. 06/05/21   [provider]  furosemide (LASIX) 20 MG tablet Take 1 tablet (20 mg total) by mouth every Monday, Wednesday, and Friday. 08/19/23   Hilty, Lisette Abu, MD  levothyroxine (SYNTHROID, LEVOTHROID) 50 MCG tablet Take 1 tablet (50 mcg total) by mouth daily before breakfast. 10/25/17   Tanner, Kathrin Greathouse., PA-C  mometasone (ELOCON) 0.1 % cream Apply 1 Application topically daily as needed (to the vulva for irritation and itching). 05/18/22    Warner Mccreedy D, NP  mupirocin ointment (BACTROBAN) 2 % Apply 1 Application topically 2 (two) times daily. 06/16/23   Rushie Chestnut, PA-C  nitroGLYCERIN (NITROSTAT) 0.4 MG SL tablet Place 1 tablet (0.4 mg total) under the tongue every 5 (five) minutes as needed for chest pain. Patient not taking: Reported on 08/25/2023 03/04/23   Glade Lloyd, MD  polyethylene glycol (MIRALAX / GLYCOLAX) 17 g packet Take 17 g by mouth daily as needed for severe constipation. Patient taking differently: Take 17 g by mouth daily. 03/29/20   Amin, Doreen Salvage, MD  REPATHA SURECLICK 140 MG/ML SOAJ INJECT 1 DOSE UNDER THE SKIN EVERY 14 DAYS 07/14/23   Hilty, Lisette Abu, MD  TIADYLT ER 300 MG 24 hr capsule Take 300 mg by mouth daily. 08/11/23   [provider]  traMADol (ULTRAM) 50 MG tablet TAKE 2 TABLETS(100 MG) BY MOUTH TWICE DAILY 08/08/23   Josph Macho, MD      Allergies    Statins and Ciprofloxacin    Review of Systems   Review of Systems  Genitourinary:  Positive for vaginal bleeding.    Physical Exam Updated Vital Signs BP (!) 170/58 (BP Location: Right Arm)   Pulse 98   Temp 99.3 F (37.4 C) (Oral)   Resp 18   SpO2 95%  Physical Exam Vitals and nursing note reviewed.  Constitutional:      General: She is  not in acute distress.    Appearance: She is well-developed. She is not diaphoretic.  HENT:     Head: Normocephalic and atraumatic.  Eyes:     Pupils: Pupils are equal, round, and reactive to light.  Cardiovascular:     Rate and Rhythm: Normal rate and regular rhythm.     Heart sounds: No murmur heard.    No friction rub. No gallop.  Pulmonary:     Effort: Pulmonary effort is normal.     Breath sounds: No wheezing or rales.  Abdominal:     General: There is no distension.     Palpations: Abdomen is soft.     Tenderness: There is no abdominal tenderness.  Genitourinary:    Comments: Patient has a heaped up lesion along the right side of the labia majora.  There is a small  amounts of oozing of red blood at the inferior aspect of this. Musculoskeletal:        General: No tenderness.     Cervical back: Normal range of motion and neck supple.  Skin:    General: Skin is warm and dry.  Neurological:     Mental Status: She is alert and oriented to person, place, and time.  Psychiatric:        Behavior: Behavior normal.     ED Results / Procedures / Treatments   Labs (all labs ordered are listed, but only abnormal results are displayed) Labs Reviewed  CBC WITH DIFFERENTIAL/PLATELET - Abnormal; Notable for the following components:      Result Value   RBC 3.56 (*)    Hemoglobin 9.7 (*)    HCT 30.3 (*)    RDW 16.3 (*)    All other components within normal limits    EKG None  Radiology No results found.  Procedures Procedures    Medications Ordered in ED Medications  lidocaine-EPINEPHrine (XYLOCAINE W/EPI) 2 %-1:200000 (PF) injection 10 mL (10 mLs Intradermal Given 08/29/23 2047)    ED Course/ Medical Decision Making/ A&P                                 Medical Decision Making Amount and/or Complexity of Data Reviewed Labs: ordered.  Risk Prescription drug management.   83 yo F with a chief complaints of bleeding from a biopsy site to her right labia.  She went to urgent care and they were unable to get it controlled.  Here this was controlled with lidocaine with epinephrine and direct pressure.  Will apply an area of quick clot there.  Patient plans on following up with her provider in the office tomorrow if able.  She did have her hemoglobin checked here and was unchanged from her baseline.  9:45 PM:  I have discussed the diagnosis/risks/treatment options with the patient and family.  Evaluation and diagnostic testing in the emergency department does not suggest an emergent condition requiring admission or immediate intervention beyond what has been performed at this time.  They will follow up with PCP. We also discussed returning to the  ED immediately if new or worsening sx occur. We discussed the sx which are most concerning (e.g., sudden worsening pain, fever, inability to tolerate by mouth) that necessitate immediate return. Medications administered to the patient during their visit and any new prescriptions provided to the patient are listed below.  Medications given during this visit Medications  lidocaine-EPINEPHrine (XYLOCAINE W/EPI) 2 %-1:200000 (PF) injection 10 mL (  10 mLs Intradermal Given 08/29/23 2047)     The patient appears reasonably screen and/or stabilized for discharge and I doubt any other medical condition or other [facility] requiring further screening, evaluation, or treatment in the ED at this time prior to discharge.          Final Clinical Impression(s) / ED Diagnoses Final diagnoses:  Bleeding    Rx / DC Orders ED Discharge Orders     None         Melene Plan, DO 08/29/23 2145

## 2023-08-29 NOTE — ED Notes (Signed)
 Pt had a biopsy last Thursday  Started bleeding heavily from the area they biopsied Dtg. Reports it was "gushing from that area" Pt is on Eliquis and Plavix When assessed with Dr. Adela Lank raised area appears to be dead tissue is bleeding.  Bleeding has slowed down, injected with Lidocaine with EPI by EDP After holding pressure bleeding is controlled at this time

## 2023-08-29 NOTE — ED Triage Notes (Signed)
 Vaginal bleeding today, "I was bleeding all over the place"  Recently had biopsy.  Seen at UC unable to control bleeding  On eliquis and plavix

## 2023-08-29 NOTE — Discharge Instructions (Addendum)
 Please follow up with your provider in the office.

## 2023-08-30 ENCOUNTER — Other Ambulatory Visit: Payer: Self-pay

## 2023-08-30 ENCOUNTER — Inpatient Hospital Stay: Attending: Hematology & Oncology | Admitting: Gynecologic Oncology

## 2023-08-30 ENCOUNTER — Encounter: Payer: Self-pay | Admitting: Family

## 2023-08-30 ENCOUNTER — Telehealth: Payer: Self-pay | Admitting: *Deleted

## 2023-08-30 VITALS — BP 141/54 | HR 80 | Temp 98.7°F | Resp 18 | Wt 141.0 lb

## 2023-08-30 DIAGNOSIS — N99820 Postprocedural hemorrhage and hematoma of a genitourinary system organ or structure following a genitourinary system procedure: Secondary | ICD-10-CM | POA: Diagnosis present

## 2023-08-30 DIAGNOSIS — N9089 Other specified noninflammatory disorders of vulva and perineum: Secondary | ICD-10-CM

## 2023-08-30 NOTE — Patient Instructions (Addendum)
 Use the peri bottle to rinse the vulva after toileting and pat dry. Do not rub.   On today's exam, the bleeding has stopped from the area on the vulva that was previous biopsied.   You can apply the topical lidocaine for discomfort as needed to the vulva but to three times daily.   Call the office for bleeding heavier than spotting especially since you are on blood thinners.

## 2023-08-30 NOTE — Telephone Encounter (Signed)
 Spoke with Ms. Hawn this morning who called the office to speak with Dr. Tamela Oddi. Pt reports having to go to urgent care and then the ED yesterday because she starting bleeding at the site of her vulva biopsy that was done on 3/26. Pt states after her biopsy on Wednesday, everything was ok, then yesterday she started bleeding and it wouldn't stop.  Ms. Lamos is requesting to see Dr. Tamela Oddi because she is afraid to remove the medicated pad that is on the site due to it may start bleeding again. Pt is also wearing a sanitary pad with tiny spots of blood. Pt denies fever, chills, and all urinary symptoms. Advised patient that her message would be relayed to providers and the office would call back.

## 2023-08-30 NOTE — Progress Notes (Unsigned)
 Patient reports scant spotting after biopsy. Yesterday she was walking around and felt a wet sensation. She reports having significant heavy bleeding from the biopsy site that would not stop. She went to urgent care and was advised to go to the ER. In the ER, the area was cauterized. No significant bleeding since that time. Reports soreness at the site. Appetite remains decreased which has unchanged. Ostomy with output, voiding without dysuria. Significant lymphedema in the LLE. Patient is voicing concern about not wanting her mother to undergo biopsy at her most recent visit and states if there was a cancer, they would elect for no treatment given the pts age etc.

## 2023-08-30 NOTE — Telephone Encounter (Signed)
 The pt's mother voiced concerns re: the pt's care.  LVM to call us back.  I will try to contact her again tomorrow.

## 2023-08-30 NOTE — Telephone Encounter (Signed)
 Spoke with Shannon Scott and pt is aware of appt. Today with Warner Mccreedy, NP at 1:30 pm. Pt thanked the office and agreed to appt. Date and time.

## 2023-08-31 ENCOUNTER — Telehealth: Payer: Self-pay | Admitting: *Deleted

## 2023-08-31 NOTE — Telephone Encounter (Signed)
-----   Message from Doylene Bode sent at 08/31/2023 11:49 AM EDT ----- If and when you talk with her, Dr. Tina Griffiths said she could increase the steroid cream to the vulva once the biopsy area has healed. She said she could use the steroid cream, clobetasol every night on the vulva for one month then stop to help with the inflammation. She can see how her symptoms are after the biopsy site heals then decide if she wants to do that above.

## 2023-08-31 NOTE — Telephone Encounter (Signed)
 Spoke with Shannon Scott briefly who states she is doing well and has only spotting on her pad, no bleeding from biopsy site. Pt also states her pain has decreased. Pt thanked the office for calling.  Patient was recalled to relay message about steroid cream and instructions.  Pt was unable to be reached. Left voicemail requesting call back.

## 2023-09-01 ENCOUNTER — Encounter: Payer: Self-pay | Admitting: Family

## 2023-09-01 ENCOUNTER — Other Ambulatory Visit: Payer: Self-pay | Admitting: Pharmacist Clinician (PhC)/ Clinical Pharmacy Specialist

## 2023-09-01 ENCOUNTER — Encounter: Payer: Self-pay | Admitting: Internal Medicine

## 2023-09-01 NOTE — Telephone Encounter (Signed)
 Spoke with Shannon Scott today. Pt state she is doing well, but still has a small clot in the area of the biopsy. Advised patient to let the area heal and not try and remove the clot.   Relayed message from Dr. Tamela Oddi once the biopsy area has healed, pt can use the steroid cream, clobetasol every night on the vulva for one month then stop to help with inflammation. Pt can see how her symptoms are after the biopsy site heals then decide if she wants to do that above. Pt verbalized understanding and thanked the office for calling.

## 2023-09-02 ENCOUNTER — Telehealth: Payer: Self-pay

## 2023-09-02 ENCOUNTER — Encounter: Payer: Self-pay | Admitting: Internal Medicine

## 2023-09-02 ENCOUNTER — Encounter: Payer: Self-pay | Admitting: Family

## 2023-09-02 NOTE — Telephone Encounter (Signed)
 Dr. Rennis Golden, patient will be scheduled as soon as possible.  Auth Submission: APPROVED Site of care: Site of care: [facility] Payer: [employer] medicare Medication & CPT/J Code(s) submitted: Leqvio (Inclisiran) [id number] Route of submission (phone, fax, portal): portal Phone # Fax # Auth type: Buy/Bill PB Units/visits requested: 284mg  x 3 doses Reference number: [id number] Approval from: 09/01/23 to 08/31/24

## 2023-09-06 ENCOUNTER — Telehealth: Payer: Self-pay | Admitting: Hematology & Oncology

## 2023-09-06 ENCOUNTER — Inpatient Hospital Stay: Admitting: Obstetrics & Gynecology

## 2023-09-06 NOTE — Telephone Encounter (Signed)
 Called to schedule IV Iron. LVM to return call for scheduling.

## 2023-09-09 ENCOUNTER — Other Ambulatory Visit: Payer: Self-pay | Admitting: Hematology & Oncology

## 2023-09-09 DIAGNOSIS — M792 Neuralgia and neuritis, unspecified: Secondary | ICD-10-CM

## 2023-09-09 DIAGNOSIS — C541 Malignant neoplasm of endometrium: Secondary | ICD-10-CM

## 2023-09-09 DIAGNOSIS — M545 Low back pain, unspecified: Secondary | ICD-10-CM

## 2023-09-09 DIAGNOSIS — G8929 Other chronic pain: Secondary | ICD-10-CM

## 2023-09-15 ENCOUNTER — Inpatient Hospital Stay

## 2023-09-20 ENCOUNTER — Telehealth: Payer: Self-pay | Admitting: Pharmacy Technician

## 2023-09-20 NOTE — Telephone Encounter (Signed)
 09/20/2023  LEQVIO Patient's Name: Shannon Scott [employer] Identification Number: [id number] Fund: Hypercholesterolemia - Medicare Access Enrollment Period: 08/21/2023 through 08/19/2024 Norberta Beans Amount: $2,500.0

## 2023-09-22 ENCOUNTER — Inpatient Hospital Stay

## 2023-09-29 ENCOUNTER — Inpatient Hospital Stay: Attending: Hematology & Oncology

## 2023-09-29 VITALS — BP 148/67 | HR 76 | Temp 98.2°F | Resp 18

## 2023-09-29 DIAGNOSIS — Z79899 Other long term (current) drug therapy: Secondary | ICD-10-CM | POA: Insufficient documentation

## 2023-09-29 DIAGNOSIS — D509 Iron deficiency anemia, unspecified: Secondary | ICD-10-CM | POA: Insufficient documentation

## 2023-09-29 DIAGNOSIS — D508 Other iron deficiency anemias: Secondary | ICD-10-CM

## 2023-09-29 DIAGNOSIS — N99820 Postprocedural hemorrhage and hematoma of a genitourinary system organ or structure following a genitourinary system procedure: Secondary | ICD-10-CM | POA: Insufficient documentation

## 2023-09-29 DIAGNOSIS — C541 Malignant neoplasm of endometrium: Secondary | ICD-10-CM | POA: Diagnosis not present

## 2023-09-29 MED ORDER — SODIUM CHLORIDE 0.9% FLUSH
3.0000 mL | Freq: Once | INTRAVENOUS | Status: AC | PRN
  Administered 2023-09-29: 3 mL

## 2023-09-29 MED ORDER — SODIUM CHLORIDE 0.9 % IV SOLN: Freq: Once | INTRAVENOUS | Status: AC

## 2023-09-29 MED ORDER — IRON SUCROSE 20 MG/ML IV SOLN
200.0000 mg | Freq: Once | INTRAVENOUS | Status: AC
  Administered 2023-09-29: 200 mg via INTRAVENOUS
  Filled 2023-09-29: qty 10

## 2023-09-29 MED ORDER — HEPARIN SOD (PORK) LOCK FLUSH 100 UNIT/ML IV SOLN
250.0000 [IU] | Freq: Once | INTRAVENOUS | Status: AC | PRN
  Administered 2023-09-29: 250 [IU]

## 2023-09-29 NOTE — Patient Instructions (Signed)

## 2023-10-06 ENCOUNTER — Telehealth: Payer: Self-pay

## 2023-10-06 ENCOUNTER — Other Ambulatory Visit (HOSPITAL_COMMUNITY): Payer: Self-pay

## 2023-10-06 ENCOUNTER — Ambulatory Visit: Attending: Internal Medicine | Admitting: Pharmacist

## 2023-10-06 ENCOUNTER — Inpatient Hospital Stay

## 2023-10-06 ENCOUNTER — Encounter: Payer: Self-pay | Admitting: Pharmacist

## 2023-10-06 VITALS — BP 143/40 | HR 70 | Temp 98.0°F | Resp 17

## 2023-10-06 DIAGNOSIS — D510 Vitamin B12 deficiency anemia due to intrinsic factor deficiency: Secondary | ICD-10-CM | POA: Insufficient documentation

## 2023-10-06 DIAGNOSIS — Z8673 Personal history of transient ischemic attack (TIA), and cerebral infarction without residual deficits: Secondary | ICD-10-CM

## 2023-10-06 DIAGNOSIS — K5904 Chronic idiopathic constipation: Secondary | ICD-10-CM | POA: Insufficient documentation

## 2023-10-06 DIAGNOSIS — R194 Change in bowel habit: Secondary | ICD-10-CM | POA: Insufficient documentation

## 2023-10-06 DIAGNOSIS — I25709 Atherosclerosis of coronary artery bypass graft(s), unspecified, with unspecified angina pectoris: Secondary | ICD-10-CM | POA: Diagnosis not present

## 2023-10-06 DIAGNOSIS — K5900 Constipation, unspecified: Secondary | ICD-10-CM | POA: Insufficient documentation

## 2023-10-06 DIAGNOSIS — R748 Abnormal levels of other serum enzymes: Secondary | ICD-10-CM | POA: Insufficient documentation

## 2023-10-06 DIAGNOSIS — K921 Melena: Secondary | ICD-10-CM | POA: Insufficient documentation

## 2023-10-06 DIAGNOSIS — R1033 Periumbilical pain: Secondary | ICD-10-CM | POA: Insufficient documentation

## 2023-10-06 DIAGNOSIS — D508 Other iron deficiency anemias: Secondary | ICD-10-CM

## 2023-10-06 DIAGNOSIS — R198 Other specified symptoms and signs involving the digestive system and abdomen: Secondary | ICD-10-CM | POA: Insufficient documentation

## 2023-10-06 DIAGNOSIS — E785 Hyperlipidemia, unspecified: Secondary | ICD-10-CM

## 2023-10-06 DIAGNOSIS — R141 Gas pain: Secondary | ICD-10-CM | POA: Insufficient documentation

## 2023-10-06 DIAGNOSIS — N99820 Postprocedural hemorrhage and hematoma of a genitourinary system organ or structure following a genitourinary system procedure: Secondary | ICD-10-CM | POA: Diagnosis not present

## 2023-10-06 DIAGNOSIS — Z79899 Other long term (current) drug therapy: Secondary | ICD-10-CM | POA: Diagnosis not present

## 2023-10-06 DIAGNOSIS — Z860100 Personal history of colon polyps, unspecified: Secondary | ICD-10-CM | POA: Insufficient documentation

## 2023-10-06 DIAGNOSIS — D509 Iron deficiency anemia, unspecified: Secondary | ICD-10-CM | POA: Diagnosis not present

## 2023-10-06 MED ORDER — NEXLIZET 180-10 MG PO TABS
1.0000 | ORAL_TABLET | Freq: Every day | ORAL | 5 refills | Status: DC
  Filled 2023-10-06 – 2023-10-07 (×2): qty 30, 30d supply, fill #0

## 2023-10-06 MED ORDER — IRON SUCROSE 20 MG/ML IV SOLN
200.0000 mg | Freq: Once | INTRAVENOUS | Status: AC
  Administered 2023-10-06: 200 mg via INTRAVENOUS
  Filled 2023-10-06: qty 10

## 2023-10-06 MED ORDER — SODIUM CHLORIDE 0.9 % IV SOLN: INTRAVENOUS | Status: DC

## 2023-10-06 NOTE — Telephone Encounter (Signed)
 Pharmacy Patient Advocate Encounter  Received notification from [employer] that Prior Authorization for NEXLIZET has been APPROVED from 10/06/23 to 04/07/24. Ran test claim, Copay is $0. This test claim was processed through [facility]- copay amounts may vary at other pharmacies due to pharmacy/plan contracts, or as the patient moves through the different stages of their insurance plan.

## 2023-10-06 NOTE — Addendum Note (Signed)
 Addended by: Sunny English on: 10/06/2023 04:50 PM   Modules accepted: Orders

## 2023-10-06 NOTE — Progress Notes (Signed)
 Patient ID: Shannon Scott                 DOB: Nov 24, 1940                    MRN: [id number]     HPI: LAMYAH DURLING is a 83 y.o. female patient referred to lipid clinic by Dr. Maximo Spar. PMH is significant for CAD s/p CABG x 3 with LIMA-LAD, SVG-OM, SVG-RCA in 2005 and s/p DES to SVG-OM 2 in 03/2023, severe aortic stenosis, locally advanced endometrial cancer s/p chemoradiation, immunotherapy, hysterectomy with BSO, colonic mass with partial colectomy and colostomy, prior DVT, PAF on eliquis , carotid artery stenosis, CVA, HTN, HLD, statin intolerance, lymphedema, asthma, hypothyroidism Echocardiogram showed EF 55 to 60%, RWMA, mild hypokinesis of the left ventricular, basal mid anterolateral wall, moderate mitral valve regurgitation, moderate aortic stenosis.  She was last seen by Palmer Bobo, NP on 08/05/23 at which time se denied CV concerns over the past 4 mo. She had a direct-LDL drawn which demonstrated LDL-C of 103 mg/dL (baseline calculated LDL-C of 358 mg/dL in 4098). She was previously instructed to switch from ezetimibe  to Nexlizet, but never started due to high copay ($47/mo). Therefore, Leqvio was ordered to attempt additional LDL lowering. She was approved for the injection with a copay of $0 after enrollment in the [facility]. Patient has not yet been scheduled for her first dose. Patient presents to lipid clinic today with additional questions about her lipid-lowering therapy.   Patient presents in good spirits. She is accompanied by her son who helps organize her medications. She reports adherence to Repatha  and ezetimibe . Her son fills her medications in a pill box and has her current medication list written down. They have a good system in place for remembering Repatha  every other week. She denies side effects to these medications. She has questions about side effects and reason for switching her cholesterol medication from Repatha  to Leqvio. She is also amenable to replacing  ezetimibe  with Nexlizet, if the copay can be reduced from $47/mo. She reports that overall she feels well and is able to be active at home.   Current Medications: Repatha  140 mg subcutaneous every 2 weeks, ezetimibe  10 mg daily,  Intolerances: statins (memory impairment, myalgias) Risk Factors: extensive ASCVD history (STEMI s/p CABG, multiple stents, TIA), baseline LDL-C of 358 mg/dL, Lp(a) 119.1 LDL goal: <55 mg/dL  Diet:  Follows diet of lean meats, no milk, cheese. Tried to avoid processed foods.   Exercise: Walks around in the house - vaccums, laundry. Feels like she gets exercise by walking around 2-3 times in her house.  Family History: Heart disease in mother (80s), stroke in Father, heart disease in brother, MI in brother, stroke 2x in sister  Social History:  Denies tobacco use  Labs:  Lipid Panel     Component Value Date/Time   CHOL 173 03/09/2023 0441   CHOL 224 (H) 11/23/2021 1547   CHOL 333 (H) 08/02/2016 1519   TRIG 70 03/09/2023 0441   TRIG 87 08/02/2016 1519   HDL 48 03/09/2023 0441   HDL 57 11/23/2021 1547   HDL 56 08/02/2016 1519   CHOLHDL 3.6 03/09/2023 0441   VLDL 14 03/09/2023 0441   LDLCALC 111 (H) 03/09/2023 0441   LDLCALC 151 (H) 11/23/2021 1547   LDLCALC 257 (H) 08/02/2016 1519   LDLDIRECT 103 (H) 08/05/2023 1548   LABVLDL 16 11/23/2021 1547     Past Medical History:  Diagnosis Date  A-fib (HCC) 03/26/2020   Acute CVA (cerebrovascular accident) (HCC) 03/26/2020   Arthritis    Asthma    allergy induced   Bilateral carotid artery disease (HCC)    L carotid bruit   Bursitis    left hip   CAD (coronary artery disease)    Cancer (HCC)    Dyslipidemia    intolerant to statins, welchol, niacin, zetia    Goals of care, counseling/discussion 10/11/2017   History of blood transfusion    History of nuclear stress test 04/24/2012   lexiscan ; normal study   Hypertension    Hypothyroidism    Malignant neoplasm involving 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-direct  metastasis from uterine cervix (HCC) 10/11/2017   Postoperative nausea and vomiting 01/02/2016    Current Outpatient Medications on File Prior to Visit  Medication Sig Dispense Refill   apixaban  (ELIQUIS ) 5 MG TABS tablet Take 1 tablet (5 mg total) by mouth 2 (two) times daily. 60 tablet 0   cephALEXin  (KEFLEX ) 500 MG capsule 4 (four) times daily.     clopidogrel  (PLAVIX ) 75 MG tablet Take 1 tablet (75 mg total) by mouth daily with breakfast. 90 tablet 2   diltiazem  (CARDIZEM  CD) 300 MG 24 hr capsule Take 1 capsule (300 mg total) by mouth daily. (Patient not taking: Reported on 08/25/2023) 90 capsule 3   ezetimibe  (ZETIA ) 10 MG tablet Take 10 mg by mouth daily.     furosemide  (LASIX ) 20 MG tablet Take 1 tablet (20 mg total) by mouth every Monday, Wednesday, and Friday. 36 tablet 3   levothyroxine  (SYNTHROID , LEVOTHROID) 50 MCG tablet Take 1 tablet (50 mcg total) by mouth daily before breakfast. 30 tablet 1   mometasone  (ELOCON ) 0.1 % cream Apply 1 Application topically daily as needed (to the vulva for irritation and itching). 45 g 0   mupirocin  ointment (BACTROBAN ) 2 % Apply 1 Application topically 2 (two) times daily. 22 g 0   nitroGLYCERIN  (NITROSTAT ) 0.4 MG SL tablet Place 1 tablet (0.4 mg total) under the tongue every 5 (five) minutes as needed for chest pain. (Patient not taking: Reported on 08/25/2023) 25 tablet 0   polyethylene glycol (MIRALAX  / GLYCOLAX ) 17 g packet Take 17 g by mouth daily as needed for severe constipation. (Patient taking differently: Take 17 g by mouth daily.) 14 each 0   REPATHA  SURECLICK 140 MG/ML SOAJ INJECT 1 DOSE UNDER THE SKIN EVERY 14 DAYS 6 mL 3   TIADYLT  ER 300 MG 24 hr capsule Take 300 mg by mouth daily.     traMADol  (ULTRAM ) 50 MG tablet TAKE 2 TABLETS(100 MG) BY MOUTH TWICE DAILY 120 tablet 0   No current facility-administered medications on file prior to visit.    Allergies  Allergen Reactions   Statins Other (See Comments)    Myalgias and memory  problems   Ciprofloxacin  Itching, Swelling and Other (See Comments)    Other reaction(s): swelling/redness at injection site    Assessment/Plan:  1. Hyperlipidemia - Currently uncontrolled with direct LDL-C of 103 mg/dL above goal < 55 mg/dL given history of multiple ASCVD events. Of note, patient's baseline LDL-C was 358 mg/dL in 6962. Unlikely that switching from Repatha  to Leqvio would have significant additional LDL-C lowering affect. Would prefer to start patient on a medication with a different mechanism of action. She has been enrolled in [employer], and most likely could obtain Nexlizet (bempedoic acid  - ezetimibe ) for $0/mo. Patient is amenable to switching ezetimibe  to Nexlizet, which may result in an additional 20-30% LDL-C  lowering as she is not on a statin due to intolerance. Discussed efficacy, dosing, side effects, and copay information. - Stop ezetimibe  - Start Nexlizet (bempedoic acid -ezetimibe ) 180-10 mg once daily - PA has been submitted in collaboration with patient advocate team. Will follow-up on results. - Patient and son requested transfer of maintenance medications to [facility] for [facility] DELIVERY. Wil collaborate with pharmacy team to facilitate transfer.   - Repeat lipid panel in 8-12 weeks. Patient requested to obtain labs at [facility].   Arthea Larsson, PharmD PGY1 Pharmacy Resident  Seen with PGY1 resident and agree with plan  Joelene Murrain, PharmD, BCACP, CDCES, CPP [address], [address] [place], [place], [address] Phone: [phone], Fax: [phone]

## 2023-10-06 NOTE — Patient Instructions (Addendum)
 It was great to see you today.   Your LDL-cholesterol is 103 mg/dL. The goal is less than 55 mg/dL.   We would like you to start a new medication called Nexlizet (ezetimibe  and bempedoic acid ) 10-180 mg once daily. Once you receive this medication, STOP ezetimibe  (Zetia ).   Continue Repatha  every 2 weeks.   We would like to you come back for a repeat lipid panel about 2-3 months after starting the new medication. You do not need an appointment, you can come have labs whenever is convenient for you.

## 2023-10-06 NOTE — Telephone Encounter (Signed)
 Pharmacy Patient Advocate Encounter   Received notification from Physician's Office that prior authorization for NEXLIZET is required/requested.   Insurance verification completed.   The patient is insured through [facility] .   Per test claim: PA required; PA submitted to above mentioned insurance via CoverMyMeds Key/confirmation #/EOC [id number] Status is pending

## 2023-10-06 NOTE — Progress Notes (Signed)
Pt declined to stay for post infusion observation period. Pt stated she has tolerated medication multiple times prior without difficulty. Pt aware to call clinic with any questions or concerns. Pt verbalized understanding and had no further questions.  ? ?

## 2023-10-06 NOTE — Telephone Encounter (Signed)
 Pharmacy Patient Advocate Encounter  Insurance verification completed.   The patient is insured through [employer] claim for [employer]. Currently a quantity of 30 is a 30 day supply and the co-pay is NA . REQUIRES A PA   This test claim was processed through [facility]- copay amounts may vary at other pharmacies due to pharmacy/plan contracts, or as the patient moves through the different stages of their insurance plan.

## 2023-10-06 NOTE — Patient Instructions (Signed)

## 2023-10-07 ENCOUNTER — Other Ambulatory Visit (HOSPITAL_COMMUNITY): Payer: Self-pay

## 2023-10-07 ENCOUNTER — Other Ambulatory Visit: Payer: Self-pay

## 2023-10-11 ENCOUNTER — Other Ambulatory Visit: Payer: Self-pay

## 2023-10-11 ENCOUNTER — Encounter: Payer: Self-pay | Admitting: Family

## 2023-10-11 ENCOUNTER — Other Ambulatory Visit (HOSPITAL_COMMUNITY): Payer: Self-pay

## 2023-10-11 ENCOUNTER — Encounter: Payer: Self-pay | Admitting: Internal Medicine

## 2023-10-11 MED ORDER — CLOPIDOGREL BISULFATE 75 MG PO TABS
75.0000 mg | ORAL_TABLET | Freq: Every day | ORAL | 4 refills | Status: DC
  Filled 2023-10-11: qty 30, 30d supply, fill #0

## 2023-10-11 MED ORDER — REPATHA SURECLICK 140 MG/ML ~~LOC~~ SOAJ
1.0000 mL | SUBCUTANEOUS | 3 refills | Status: DC
Start: 2023-07-28 — End: 2024-01-11
  Filled 2023-10-11: qty 6, 84d supply, fill #0
  Filled 2024-01-10: qty 6, 84d supply, fill #1

## 2023-10-11 MED ORDER — APIXABAN 5 MG PO TABS
5.0000 mg | ORAL_TABLET | Freq: Two times a day (BID) | ORAL | 3 refills | Status: DC
  Filled 2023-10-11: qty 180, 90d supply, fill #0

## 2023-10-11 MED ORDER — NEXLETOL 180 MG PO TABS
180.0000 mg | ORAL_TABLET | Freq: Every day | ORAL | 2 refills | Status: DC
  Filled 2023-10-11: qty 30, 30d supply, fill #0

## 2023-10-11 MED ORDER — LEVOTHYROXINE SODIUM 50 MCG PO TABS
50.0000 ug | ORAL_TABLET | Freq: Every day | ORAL | 3 refills | Status: AC
  Filled 2023-10-11 – 2024-05-28 (×5): qty 90, 90d supply, fill #0

## 2023-10-11 MED ORDER — DILTIAZEM HCL ER BEADS 300 MG PO CP24
300.0000 mg | ORAL_CAPSULE | Freq: Every day | ORAL | 1 refills | Status: DC
Start: 2023-02-09 — End: 2024-02-29
  Filled 2023-10-11: qty 90, 90d supply, fill #0

## 2023-10-11 MED FILL — Furosemide Tab 20 MG: ORAL | 84 days supply | Qty: 36 | Fill #0 | Status: CN

## 2023-10-13 ENCOUNTER — Encounter (HOSPITAL_BASED_OUTPATIENT_CLINIC_OR_DEPARTMENT_OTHER): Attending: General Surgery | Admitting: Internal Medicine

## 2023-10-13 DIAGNOSIS — Z8542 Personal history of malignant neoplasm of other parts of uterus: Secondary | ICD-10-CM | POA: Diagnosis not present

## 2023-10-13 DIAGNOSIS — Z90722 Acquired absence of ovaries, bilateral: Secondary | ICD-10-CM | POA: Diagnosis not present

## 2023-10-13 DIAGNOSIS — Z86718 Personal history of other venous thrombosis and embolism: Secondary | ICD-10-CM | POA: Insufficient documentation

## 2023-10-13 DIAGNOSIS — I890 Lymphedema, not elsewhere classified: Secondary | ICD-10-CM | POA: Diagnosis not present

## 2023-10-13 DIAGNOSIS — I2510 Atherosclerotic heart disease of native coronary artery without angina pectoris: Secondary | ICD-10-CM | POA: Insufficient documentation

## 2023-10-13 DIAGNOSIS — L97822 Non-pressure chronic ulcer of other part of left lower leg with fat layer exposed: Secondary | ICD-10-CM | POA: Diagnosis not present

## 2023-10-13 DIAGNOSIS — Z923 Personal history of irradiation: Secondary | ICD-10-CM | POA: Insufficient documentation

## 2023-10-13 DIAGNOSIS — Z9221 Personal history of antineoplastic chemotherapy: Secondary | ICD-10-CM | POA: Diagnosis not present

## 2023-10-13 DIAGNOSIS — Z951 Presence of aortocoronary bypass graft: Secondary | ICD-10-CM | POA: Diagnosis not present

## 2023-10-13 DIAGNOSIS — Z9079 Acquired absence of other genital organ(s): Secondary | ICD-10-CM | POA: Insufficient documentation

## 2023-10-13 DIAGNOSIS — Z8673 Personal history of transient ischemic attack (TIA), and cerebral infarction without residual deficits: Secondary | ICD-10-CM | POA: Diagnosis not present

## 2023-10-13 DIAGNOSIS — L97821 Non-pressure chronic ulcer of other part of left lower leg limited to breakdown of skin: Secondary | ICD-10-CM | POA: Insufficient documentation

## 2023-10-13 DIAGNOSIS — Z90710 Acquired absence of both cervix and uterus: Secondary | ICD-10-CM | POA: Diagnosis not present

## 2023-10-14 ENCOUNTER — Inpatient Hospital Stay (HOSPITAL_BASED_OUTPATIENT_CLINIC_OR_DEPARTMENT_OTHER): Admitting: Hematology & Oncology

## 2023-10-14 ENCOUNTER — Inpatient Hospital Stay

## 2023-10-14 ENCOUNTER — Encounter: Payer: Self-pay | Admitting: Hematology & Oncology

## 2023-10-14 VITALS — BP 121/47 | HR 62 | Temp 98.1°F | Resp 18 | Ht 61.0 in | Wt 137.0 lb

## 2023-10-14 VITALS — BP 148/45 | HR 61 | Resp 20

## 2023-10-14 DIAGNOSIS — I480 Paroxysmal atrial fibrillation: Secondary | ICD-10-CM

## 2023-10-14 DIAGNOSIS — D509 Iron deficiency anemia, unspecified: Secondary | ICD-10-CM

## 2023-10-14 DIAGNOSIS — N99820 Postprocedural hemorrhage and hematoma of a genitourinary system organ or structure following a genitourinary system procedure: Secondary | ICD-10-CM | POA: Diagnosis not present

## 2023-10-14 DIAGNOSIS — C541 Malignant neoplasm of endometrium: Secondary | ICD-10-CM

## 2023-10-14 DIAGNOSIS — Z951 Presence of aortocoronary bypass graft: Secondary | ICD-10-CM

## 2023-10-14 DIAGNOSIS — E039 Hypothyroidism, unspecified: Secondary | ICD-10-CM

## 2023-10-14 DIAGNOSIS — Z79899 Other long term (current) drug therapy: Secondary | ICD-10-CM | POA: Diagnosis not present

## 2023-10-14 DIAGNOSIS — D508 Other iron deficiency anemias: Secondary | ICD-10-CM

## 2023-10-14 LAB — CMP (CANCER CENTER ONLY)
ALT: 6 U/L (ref 0–44)
AST: 13 U/L — ABNORMAL LOW (ref 15–41)
Albumin: 4 g/dL (ref 3.5–5.0)
Alkaline Phosphatase: 63 U/L (ref 38–126)
Anion gap: 6 (ref 5–15)
BUN: 16 mg/dL (ref 8–23)
CO2: 29 mmol/L (ref 22–32)
Calcium: 9.3 mg/dL (ref 8.9–10.3)
Chloride: 104 mmol/L (ref 98–111)
Creatinine: 0.89 mg/dL (ref 0.44–1.00)
GFR, Estimated: >60 mL/min (ref >60)
Glucose, Bld: 101 mg/dL — ABNORMAL HIGH (ref 70–99)
Potassium: 4.1 mmol/L (ref 3.5–5.1)
Sodium: 139 mmol/L (ref 135–145)
Total Bilirubin: 0.3 mg/dL (ref 0.0–1.2)
Total Protein: 6.3 g/dL — ABNORMAL LOW (ref 6.5–8.1)

## 2023-10-14 LAB — CBC WITH DIFFERENTIAL (CANCER CENTER ONLY)
Abs Immature Granulocytes: 0.02 10*3/uL (ref 0.00–0.07)
Basophils Absolute: 0 10*3/uL (ref 0.0–0.1)
Basophils Relative: 1 %
Eosinophils Absolute: 0.1 10*3/uL (ref 0.0–0.5)
Eosinophils Relative: 1 %
HCT: 27.9 % — ABNORMAL LOW (ref 36.0–46.0)
Hemoglobin: 8.6 g/dL — ABNORMAL LOW (ref 12.0–15.0)
Immature Granulocytes: 1 %
Lymphocytes Relative: 14 %
Lymphs Abs: 0.6 10*3/uL — ABNORMAL LOW (ref 0.7–4.0)
MCH: 26.7 pg (ref 26.0–34.0)
MCHC: 30.8 g/dL (ref 30.0–36.0)
MCV: 86.6 fL (ref 80.0–100.0)
Monocytes Absolute: 0.2 10*3/uL (ref 0.1–1.0)
Monocytes Relative: 6 %
Neutro Abs: 3.3 10*3/uL (ref 1.7–7.7)
Neutrophils Relative %: 77 %
Platelet Count: 294 10*3/uL (ref 150–400)
RBC: 3.22 MIL/uL — ABNORMAL LOW (ref 3.87–5.11)
RDW: 15.7 % — ABNORMAL HIGH (ref 11.5–15.5)
WBC Count: 4.2 10*3/uL (ref 4.0–10.5)
nRBC: 0 % (ref 0.0–0.2)

## 2023-10-14 LAB — RETICULOCYTES
Immature Retic Fract: 11.7 % (ref 2.3–15.9)
RBC.: 3.24 MIL/uL — ABNORMAL LOW (ref 3.87–5.11)
Retic Count, Absolute: 36.3 10*3/uL (ref 19.0–186.0)
Retic Ct Pct: 1.1 % (ref 0.4–3.1)

## 2023-10-14 LAB — FERRITIN: Ferritin: 294 ng/mL (ref 11–307)

## 2023-10-14 MED ORDER — ALTEPLASE 2 MG IJ SOLR: 2.0000 mg | Freq: Once | INTRAMUSCULAR | Status: DC | PRN

## 2023-10-14 MED ORDER — HEPARIN SOD (PORK) LOCK FLUSH 100 UNIT/ML IV SOLN
500.0000 [IU] | Freq: Once | INTRAVENOUS | Status: AC
  Administered 2023-10-14: 500 [IU] via INTRAVENOUS

## 2023-10-14 MED ORDER — SODIUM CHLORIDE 0.9% FLUSH
10.0000 mL | INTRAVENOUS | Status: DC | PRN
  Administered 2023-10-14: 10 mL via INTRAVENOUS

## 2023-10-14 MED ORDER — IRON SUCROSE 20 MG/ML IV SOLN: 200.0000 mg | Freq: Once | INTRAVENOUS | Status: DC

## 2023-10-14 MED ORDER — SODIUM CHLORIDE 0.9 % IV SOLN: INTRAVENOUS | Status: DC

## 2023-10-14 MED ORDER — IRON SUCROSE 20 MG/ML IV SOLN
200.0000 mg | Freq: Once | INTRAVENOUS | Status: AC
  Administered 2023-10-14: 200 mg via INTRAVENOUS
  Filled 2023-10-14: qty 10

## 2023-10-14 NOTE — Patient Instructions (Signed)

## 2023-10-14 NOTE — Progress Notes (Signed)
 Hematology and Oncology Follow Up Visit  Shannon Scott [id number] [birth year] 83 y.o. 10/14/2023   Principle Diagnosis:  Locally advanced/recurrent endometrial carcinoma undifferentiated --  TMB (HIGH) / MSI HIGH  Intermittent iron  deficiency anemia   Past Therapy: Radiation therapy for 5 -5 1/2 weeks - s/p 20 fractions Taxol /carboplatinum q 7 days -- s/p cycle 6    Current Therapy:        Pembrolizumab  400 mg q 12 weeks, s/p cycle 20 (originally started on 01/23/2019 at 400 mg IV q 6 wk)  --on hold for patient request since April 2022 Zometa  4 mg IV q 12 months -- next dose due 09/2023 IV iron  as indicated --Venofer  - last infusion was on 08/04/2023       Interim History:  Shannon Scott is here today for follow-up.  She seems to be managing pretty well.  She got iron  back in early March.  At that time, her iron  saturation was only 9%.  I think that she is being followed quite closely by cardiology because of her coronary artery disease.  She continues on her anticoagulation.  I think she is on Eliquis , Plavix  and aspirin .  She has had no problems with cough.  She has had no abdominal pain.  She has a lot of swelling in the left leg.  I noted this to be followed by her family doctor.  She has had no issues with fever.  There is been no problems with COVID or Influenza.  Her last CA-125 was 12.6.  Overall, I would say that her performance status is probably ECOG 2.   Wt Readings from Last 3 Encounters:  10/14/23 137 lb (62.1 kg)  08/30/23 141 lb (64 kg)  08/25/23 139 lb (63 kg)   Medications:  Allergies as of 10/14/2023       Reactions   Statins Other (See Comments)   Myalgias and memory problems   Ciprofloxacin  Itching, Swelling, Other (See Comments)   Other reaction(s): swelling/redness at injection site        Medication List        Accurate as of Oct 14, 2023  2:01 PM. If you have any questions, ask your nurse or doctor.          STOP taking these  medications    cephALEXin  500 MG capsule Commonly known as: KEFLEX  Stopped by: Ivor Mars       TAKE these medications    apixaban  5 MG Tabs tablet Commonly known as: ELIQUIS  Take 1 tablet (5 mg total) by mouth 2 (two) times daily.   clopidogrel  75 MG tablet Commonly known as: PLAVIX  Take 1 tablet (75 mg total) by mouth daily with breakfast.   diltiazem  300 MG 24 hr capsule Commonly known as: Cardizem  CD Take 1 capsule (300 mg total) by mouth daily.   furosemide  20 MG tablet Commonly known as: LASIX  Take 1 tablet (20 mg total) by mouth every Monday, Wednesday, and Friday.   levothyroxine  50 MCG tablet Commonly known as: SYNTHROID  Take 1 tablet (50 mcg total) by mouth daily.   mometasone  0.1 % cream Commonly known as: ELOCON  Apply 1 Application topically daily as needed (to the vulva for irritation and itching).   mupirocin  ointment 2 % Commonly known as: BACTROBAN  Apply 1 Application topically 2 (two) times daily.   Nexletol  180 MG Tabs Generic drug: Bempedoic Acid  Take 1 tablet (180 mg total) by mouth daily.   Nexlizet  180-10 MG Tabs Generic drug: Bempedoic Acid -Ezetimibe  Take 1 tablet  by mouth daily.   nitroGLYCERIN  0.4 MG SL tablet Commonly known as: NITROSTAT  Place 1 tablet (0.4 mg total) under the tongue every 5 (five) minutes as needed for chest pain.   polyethylene glycol 17 g packet Commonly known as: MIRALAX  / GLYCOLAX  Take 17 g by mouth daily as needed for severe constipation. What changed: when to take this   Repatha  SureClick 140 MG/ML Soaj Generic drug: Evolocumab  Inject 140 mg into the skin every 14 (fourteen) days.   diltiazem  300 MG 24 hr capsule Commonly known as: Tiadylt  ER Take 1 capsule (300 mg total) by mouth daily.   Tiadylt  ER 300 MG 24 hr capsule Generic drug: diltiazem  Take 300 mg by mouth daily.   traMADol  50 MG tablet Commonly known as: ULTRAM  TAKE 2 TABLETS(100 MG) BY MOUTH TWICE DAILY        Allergies:   Allergies  Allergen Reactions  . Statins Other (See Comments)    Myalgias and memory problems  . Ciprofloxacin  Itching, Swelling and Other (See Comments)    Other reaction(s): swelling/redness at injection site    Past Medical History, Surgical history, Social history, and Family History were reviewed and updated.  Review of Systems: Review of Systems  Constitutional: Negative.   HENT: Negative.    Eyes: Negative.   Respiratory: Negative.    Cardiovascular:  Positive for leg swelling.  Gastrointestinal: Negative.   Genitourinary: Negative.   Musculoskeletal:  Positive for back pain.  Skin: Negative.   Neurological: Negative.   Endo/Heme/Allergies: Negative.   Psychiatric/Behavioral: Negative.       Physical Exam:  height is 5\' 1"  (1.549 m) and weight is 137 lb (62.1 kg). Her oral temperature is 98.1 F (36.7 C). Her blood pressure is 121/47 (abnormal) and her pulse is 62. Her respiration is 18 and oxygen saturation is 98%.   Wt Readings from Last 3 Encounters:  10/14/23 137 lb (62.1 kg)  08/30/23 141 lb (64 kg)  08/25/23 139 lb (63 kg)    Physical Exam Vitals reviewed.  HENT:     Head: Normocephalic and atraumatic.  Eyes:     Pupils: Pupils are equal, round, and reactive to light.  Cardiovascular:     Rate and Rhythm: Normal rate and regular rhythm.     Heart sounds: Normal heart sounds.     Comments: Cardiac exam shows a regular rate and rhythm.  She has a 4/6 systolic ejection murmur.   Pulmonary:     Effort: Pulmonary effort is normal.     Breath sounds: Normal breath sounds.  Abdominal:     General: Bowel sounds are normal.     Palpations: Abdomen is soft.  Musculoskeletal:        General: No tenderness or deformity. Normal range of motion.     Cervical back: Normal range of motion.     Comments: She has significant lymphedema in her legs.  She has probably 3+ in the left leg and 1+ in the right leg. She has pitting edema.  She has decent range of motion  of her joints.  Lymphadenopathy:     Cervical: No cervical adenopathy.  Skin:    General: Skin is warm and dry.     Findings: No erythema or rash.  Neurological:     Mental Status: She is alert and oriented to person, place, and time.  Psychiatric:        Behavior: Behavior normal.        Thought Content: Thought content normal.  Judgment: Judgment normal.     Lab Results  Component Value Date   WBC 4.2 10/14/2023   HGB 8.6 (L) 10/14/2023   HCT 27.9 (L) 10/14/2023   MCV 86.6 10/14/2023   PLT 294 10/14/2023   Lab Results  Component Value Date   FERRITIN 117 08/25/2023   IRON  27 (L) 08/25/2023   TIBC 246 (L) 08/25/2023   UIBC 219 08/25/2023   IRONPCTSAT 11 08/25/2023   Lab Results  Component Value Date   RETICCTPCT 1.1 10/14/2023   RBC 3.24 (L) 10/14/2023   No results found for: "KPAFRELGTCHN", "LAMBDASER", "KAPLAMBRATIO" No results found for: "IGGSERUM", "IGA", "IGMSERUM" No results found for: "TOTALPROTELP", "ALBUMINELP", "A1GS", "A2GS", "BETS", "BETA2SER", "GAMS", "MSPIKE", "SPEI"   Chemistry      Component Value Date/Time   NA 140 08/25/2023 1359   NA 136 (A) 09/08/2017 0000   K 4.0 08/25/2023 1359   CL 104 08/25/2023 1359   CO2 29 08/25/2023 1359   BUN 16 08/25/2023 1359   BUN 8 09/08/2017 0000   CREATININE 0.83 08/25/2023 1359   CREATININE 0.69 08/02/2016 1519   GLU 111 09/08/2017 0000      Component Value Date/Time   CALCIUM 9.2 08/25/2023 1359   ALKPHOS 75 08/25/2023 1359   AST 13 (L) 08/25/2023 1359   ALT 6 08/25/2023 1359   BILITOT 0.2 08/25/2023 1359       Impression and Plan: Ms. Nothdurft is a very pleasant 83 yo caucasian female with recurrent undifferentiated endometrial carcinoma.  She has Lynch syndrome.  She was on immunotherapy.  She tolerated this incredibly well.  It worked incredibly well. This has been on hold per pt request.  I will plan to see what her iron  levels are.  Again she is anemic.  Her erythropoietin  level was  only 10.6.  Unfortunately, because of her cerebrovascular and cardiac disease, I just do not know if we can give her ESA.  Again we will see what her iron  levels are.  Will have her come back in about 6 weeks or so.  Is been years since she had Zometa .  I think this is going to be helpful for her.  I do not see any role for radiologic studies for the endometrial cancer.  I just do not see where this is recurred clinically.     Ivor Mars, MD 5/16/20252:01 PM

## 2023-10-14 NOTE — Patient Instructions (Signed)

## 2023-10-14 NOTE — Progress Notes (Addendum)
 Per Sherian Dimitri, pt states that she wants to wait to receive Zometa , as she doesn't feel good when she gets iron  and Zometa  at the same time. Pt will consider scheduling Zometa  for next week.  Fendi Meinhardt, PharmD, MBA

## 2023-10-15 LAB — CA 125: Cancer Antigen (CA) 125: 15.3 U/mL (ref 0.0–38.1)

## 2023-10-17 LAB — IRON AND IRON BINDING CAPACITY (CC-WL,HP ONLY)
Iron: 34 ug/dL (ref 28–170)
Saturation Ratios: 14 % (ref 10.4–31.8)
TIBC: 249 ug/dL — ABNORMAL LOW (ref 250–450)
UIBC: 215 ug/dL (ref 148–442)

## 2023-10-20 ENCOUNTER — Encounter (HOSPITAL_BASED_OUTPATIENT_CLINIC_OR_DEPARTMENT_OTHER): Admitting: General Surgery

## 2023-10-20 DIAGNOSIS — Z923 Personal history of irradiation: Secondary | ICD-10-CM | POA: Diagnosis not present

## 2023-10-20 DIAGNOSIS — Z951 Presence of aortocoronary bypass graft: Secondary | ICD-10-CM | POA: Diagnosis not present

## 2023-10-20 DIAGNOSIS — Z9221 Personal history of antineoplastic chemotherapy: Secondary | ICD-10-CM | POA: Diagnosis not present

## 2023-10-20 DIAGNOSIS — I2510 Atherosclerotic heart disease of native coronary artery without angina pectoris: Secondary | ICD-10-CM | POA: Diagnosis not present

## 2023-10-20 DIAGNOSIS — L97821 Non-pressure chronic ulcer of other part of left lower leg limited to breakdown of skin: Secondary | ICD-10-CM | POA: Diagnosis not present

## 2023-10-20 DIAGNOSIS — Z86718 Personal history of other venous thrombosis and embolism: Secondary | ICD-10-CM | POA: Diagnosis not present

## 2023-10-20 DIAGNOSIS — I890 Lymphedema, not elsewhere classified: Secondary | ICD-10-CM | POA: Diagnosis not present

## 2023-10-20 DIAGNOSIS — Z8673 Personal history of transient ischemic attack (TIA), and cerebral infarction without residual deficits: Secondary | ICD-10-CM | POA: Diagnosis not present

## 2023-10-21 ENCOUNTER — Encounter (HOSPITAL_BASED_OUTPATIENT_CLINIC_OR_DEPARTMENT_OTHER): Admitting: Internal Medicine

## 2023-10-21 DIAGNOSIS — Z951 Presence of aortocoronary bypass graft: Secondary | ICD-10-CM | POA: Diagnosis not present

## 2023-10-21 DIAGNOSIS — Z8673 Personal history of transient ischemic attack (TIA), and cerebral infarction without residual deficits: Secondary | ICD-10-CM | POA: Diagnosis not present

## 2023-10-21 DIAGNOSIS — Z923 Personal history of irradiation: Secondary | ICD-10-CM | POA: Diagnosis not present

## 2023-10-21 DIAGNOSIS — Z9221 Personal history of antineoplastic chemotherapy: Secondary | ICD-10-CM | POA: Diagnosis not present

## 2023-10-21 DIAGNOSIS — I890 Lymphedema, not elsewhere classified: Secondary | ICD-10-CM | POA: Diagnosis not present

## 2023-10-21 DIAGNOSIS — L97821 Non-pressure chronic ulcer of other part of left lower leg limited to breakdown of skin: Secondary | ICD-10-CM | POA: Diagnosis not present

## 2023-10-21 DIAGNOSIS — I2510 Atherosclerotic heart disease of native coronary artery without angina pectoris: Secondary | ICD-10-CM | POA: Diagnosis not present

## 2023-10-21 DIAGNOSIS — Z86718 Personal history of other venous thrombosis and embolism: Secondary | ICD-10-CM | POA: Diagnosis not present

## 2023-11-01 ENCOUNTER — Encounter (HOSPITAL_BASED_OUTPATIENT_CLINIC_OR_DEPARTMENT_OTHER): Attending: General Surgery | Admitting: General Surgery

## 2023-11-01 DIAGNOSIS — Z09 Encounter for follow-up examination after completed treatment for conditions other than malignant neoplasm: Secondary | ICD-10-CM | POA: Insufficient documentation

## 2023-11-01 DIAGNOSIS — L97821 Non-pressure chronic ulcer of other part of left lower leg limited to breakdown of skin: Secondary | ICD-10-CM | POA: Insufficient documentation

## 2023-11-01 DIAGNOSIS — I890 Lymphedema, not elsewhere classified: Secondary | ICD-10-CM | POA: Insufficient documentation

## 2023-11-09 ENCOUNTER — Telehealth: Payer: Self-pay | Admitting: Internal Medicine

## 2023-11-09 NOTE — Telephone Encounter (Signed)
 Pt c/o medication issue:  1. Name of Medication: Bempedoic Acid  (NEXLETOL ) 180 MG TABS   2. How are you currently taking this medication (dosage and times per day)?    3. Are you having a reaction (difficulty breathing--STAT)? no  4. What is your medication issue? Patient states the medication is causing her bruising. She has stop taking he medication. Please advise

## 2023-11-09 NOTE — Telephone Encounter (Addendum)
 Spoke with patient and she states since starting Nexlizet  she has been having bruising. She has stop taking the medication. She states she read that it can cause bruising.  Informed patient I do not think her bruising is from her nexlizet .   Also informed patient that taking both eliquis  and plavix  can be the cause of her bruising.

## 2023-11-14 NOTE — Telephone Encounter (Signed)
 Left voicemail to return call to office

## 2023-11-16 ENCOUNTER — Inpatient Hospital Stay

## 2023-11-16 ENCOUNTER — Ambulatory Visit: Payer: Self-pay | Admitting: Hematology & Oncology

## 2023-11-16 ENCOUNTER — Inpatient Hospital Stay (HOSPITAL_BASED_OUTPATIENT_CLINIC_OR_DEPARTMENT_OTHER): Admitting: Medical Oncology

## 2023-11-16 ENCOUNTER — Inpatient Hospital Stay: Attending: Hematology & Oncology

## 2023-11-16 VITALS — BP 133/43 | HR 72 | Temp 98.1°F | Resp 19 | Ht 61.0 in | Wt 129.0 lb

## 2023-11-16 DIAGNOSIS — Z951 Presence of aortocoronary bypass graft: Secondary | ICD-10-CM

## 2023-11-16 DIAGNOSIS — R5383 Other fatigue: Secondary | ICD-10-CM | POA: Diagnosis not present

## 2023-11-16 DIAGNOSIS — Z7901 Long term (current) use of anticoagulants: Secondary | ICD-10-CM | POA: Diagnosis not present

## 2023-11-16 DIAGNOSIS — D509 Iron deficiency anemia, unspecified: Secondary | ICD-10-CM

## 2023-11-16 DIAGNOSIS — Z79899 Other long term (current) drug therapy: Secondary | ICD-10-CM | POA: Diagnosis not present

## 2023-11-16 DIAGNOSIS — Z7902 Long term (current) use of antithrombotics/antiplatelets: Secondary | ICD-10-CM | POA: Diagnosis not present

## 2023-11-16 DIAGNOSIS — D508 Other iron deficiency anemias: Secondary | ICD-10-CM

## 2023-11-16 DIAGNOSIS — Z1509 Genetic susceptibility to other malignant neoplasm: Secondary | ICD-10-CM | POA: Diagnosis not present

## 2023-11-16 DIAGNOSIS — C541 Malignant neoplasm of endometrium: Secondary | ICD-10-CM

## 2023-11-16 DIAGNOSIS — Z7951 Long term (current) use of inhaled steroids: Secondary | ICD-10-CM | POA: Diagnosis not present

## 2023-11-16 DIAGNOSIS — R634 Abnormal weight loss: Secondary | ICD-10-CM | POA: Insufficient documentation

## 2023-11-16 DIAGNOSIS — Z95828 Presence of other vascular implants and grafts: Secondary | ICD-10-CM | POA: Diagnosis not present

## 2023-11-16 DIAGNOSIS — Z923 Personal history of irradiation: Secondary | ICD-10-CM | POA: Diagnosis not present

## 2023-11-16 DIAGNOSIS — D539 Nutritional anemia, unspecified: Secondary | ICD-10-CM

## 2023-11-16 LAB — CBC WITH DIFFERENTIAL (CANCER CENTER ONLY)
Abs Immature Granulocytes: 0.01 10*3/uL (ref 0.00–0.07)
Basophils Absolute: 0 10*3/uL (ref 0.0–0.1)
Basophils Relative: 1 %
Eosinophils Absolute: 0.1 10*3/uL (ref 0.0–0.5)
Eosinophils Relative: 1 %
HCT: 26.5 % — ABNORMAL LOW (ref 36.0–46.0)
Hemoglobin: 8.2 g/dL — ABNORMAL LOW (ref 12.0–15.0)
Immature Granulocytes: 0 %
Lymphocytes Relative: 14 %
Lymphs Abs: 0.5 10*3/uL — ABNORMAL LOW (ref 0.7–4.0)
MCH: 27 pg (ref 26.0–34.0)
MCHC: 30.9 g/dL (ref 30.0–36.0)
MCV: 87.2 fL (ref 80.0–100.0)
Monocytes Absolute: 0.2 10*3/uL (ref 0.1–1.0)
Monocytes Relative: 6 %
Neutro Abs: 3.1 10*3/uL (ref 1.7–7.7)
Neutrophils Relative %: 78 %
Platelet Count: 272 10*3/uL (ref 150–400)
RBC: 3.04 MIL/uL — ABNORMAL LOW (ref 3.87–5.11)
RDW: 16 % — ABNORMAL HIGH (ref 11.5–15.5)
WBC Count: 4 10*3/uL (ref 4.0–10.5)
nRBC: 0 % (ref 0.0–0.2)

## 2023-11-16 LAB — CMP (CANCER CENTER ONLY)
ALT: 14 U/L (ref 0–44)
AST: 13 U/L — ABNORMAL LOW (ref 15–41)
Albumin: 3.9 g/dL (ref 3.5–5.0)
Alkaline Phosphatase: 58 U/L (ref 38–126)
Anion gap: 6 (ref 5–15)
BUN: 20 mg/dL (ref 8–23)
CO2: 29 mmol/L (ref 22–32)
Calcium: 9.2 mg/dL (ref 8.9–10.3)
Chloride: 105 mmol/L (ref 98–111)
Creatinine: 0.93 mg/dL (ref 0.44–1.00)
GFR, Estimated: >60 mL/min (ref >60)
Glucose, Bld: 112 mg/dL — ABNORMAL HIGH (ref 70–99)
Potassium: 3.9 mmol/L (ref 3.5–5.1)
Sodium: 140 mmol/L (ref 135–145)
Total Bilirubin: 0.2 mg/dL (ref 0.0–1.2)
Total Protein: 6.2 g/dL — ABNORMAL LOW (ref 6.5–8.1)

## 2023-11-16 LAB — FERRITIN: Ferritin: 152 ng/mL (ref 11–307)

## 2023-11-16 LAB — IRON AND IRON BINDING CAPACITY (CC-WL,HP ONLY)
Iron: 36 ug/dL (ref 28–170)
Saturation Ratios: 14 % (ref 10.4–31.8)
TIBC: 249 ug/dL — ABNORMAL LOW (ref 250–450)
UIBC: 213 ug/dL (ref 148–442)

## 2023-11-16 LAB — SAMPLE TO BLOOD BANK

## 2023-11-16 MED ORDER — SODIUM CHLORIDE 0.9% FLUSH
10.0000 mL | INTRAVENOUS | Status: DC | PRN
  Administered 2023-11-16: 10 mL via INTRAVENOUS

## 2023-11-16 MED ORDER — SODIUM CHLORIDE 0.9 % IV SOLN: Freq: Once | INTRAVENOUS | Status: AC

## 2023-11-16 MED ORDER — HEPARIN SOD (PORK) LOCK FLUSH 100 UNIT/ML IV SOLN
500.0000 [IU] | Freq: Once | INTRAVENOUS | Status: AC
  Administered 2023-11-16: 500 [IU] via INTRAVENOUS

## 2023-11-16 MED ORDER — ZOLEDRONIC ACID 4 MG/100ML IV SOLN
4.0000 mg | Freq: Once | INTRAVENOUS | Status: AC
  Administered 2023-11-16: 4 mg via INTRAVENOUS
  Filled 2023-11-16: qty 100

## 2023-11-16 NOTE — Patient Instructions (Signed)

## 2023-11-16 NOTE — Patient Instructions (Signed)

## 2023-11-16 NOTE — Progress Notes (Signed)
 Hematology and Oncology Follow Up Visit  Shannon Scott [id number] 20-Jul-1940 83 y.o. 11/16/2023   Principle Diagnosis:  Locally advanced/recurrent endometrial carcinoma undifferentiated --  TMB (HIGH) / MSI HIGH  Intermittent iron  deficiency anemia   Past Therapy: Radiation therapy for 5 -5 1/2 weeks - s/p 20 fractions Taxol /carboplatinum q 7 days -- s/p cycle 6    Current Therapy:        Pembrolizumab  400 mg q 12 weeks, s/p cycle 20 (originally started on 01/23/2019 at 400 mg IV q 6 wk)  --on hold for patient request since April 2022 Zometa  4 mg IV q 12 months -- next dose due 09/2023 IV iron  as indicated --Venofer  - last infusion was on 10/14/2023    Interim History:  Shannon Scott is here today for follow-up.    She reports that she has been ok. She has noticed some unintentional weight loss and some continued fatigue. She also has noticed an increase of generalized bruising.  Recently she has been receiving IV iron  which she has tolerated well.   She continues to be followed closely by cardiology.   She continues on her anticoagulation with Eliquis , Plavix  and aspirin .  She has had no problems with cough.  She has had no abdominal pain.  She has a lot of swelling in the left leg.  I noted this to be followed by her family doctor.  She has had no issues with fever.  There is been no problems with COVID or Influenza.  She denies any dental or jaw pain. She has no upcoming planned dental work or extractions.   Her last CA-125 was 15.3 on 10/14/2023- this is slowly trending upwards since being off treatment. Her weight is also trending down despite having a decent appetite.   Overall, I would say that her performance status is probably ECOG 2-3.   Wt Readings from Last 3 Encounters:  11/16/23 129 lb (58.5 kg)  10/14/23 137 lb (62.1 kg)  08/30/23 141 lb (64 kg)   Medications:  Allergies as of 11/16/2023       Reactions   Statins Other (See Comments)   Myalgias and  memory problems   Ciprofloxacin  Itching, Swelling, Other (See Comments)   Other reaction(s): swelling/redness at injection site        Medication List        Accurate as of November 16, 2023  2:04 PM. If you have any questions, ask your nurse or doctor.          apixaban  5 MG Tabs tablet Commonly known as: ELIQUIS  Take 1 tablet (5 mg total) by mouth 2 (two) times daily.   clopidogrel  75 MG tablet Commonly known as: PLAVIX  Take 1 tablet (75 mg total) by mouth daily with breakfast.   diltiazem  300 MG 24 hr capsule Commonly known as: Cardizem  CD Take 1 capsule (300 mg total) by mouth daily.   furosemide  20 MG tablet Commonly known as: LASIX  Take 1 tablet (20 mg total) by mouth every Monday, Wednesday, and Friday.   levothyroxine  50 MCG tablet Commonly known as: SYNTHROID  Take 1 tablet (50 mcg total) by mouth daily.   mometasone  0.1 % cream Commonly known as: ELOCON  Apply 1 Application topically daily as needed (to the vulva for irritation and itching).   mupirocin  ointment 2 % Commonly known as: BACTROBAN  Apply 1 Application topically 2 (two) times daily.   Nexletol  180 MG Tabs Generic drug: Bempedoic Acid  Take 1 tablet (180 mg total) by mouth daily.  Nexlizet  180-10 MG Tabs Generic drug: Bempedoic Acid -Ezetimibe  Take 1 tablet by mouth daily.   nitroGLYCERIN  0.4 MG SL tablet Commonly known as: NITROSTAT  Place 1 tablet (0.4 mg total) under the tongue every 5 (five) minutes as needed for chest pain.   polyethylene glycol 17 g packet Commonly known as: MIRALAX  / GLYCOLAX  Take 17 g by mouth daily as needed for severe constipation. What changed: when to take this   Repatha  SureClick 140 MG/ML Soaj Generic drug: Evolocumab  Inject 140 mg into the skin every 14 (fourteen) days.   diltiazem  300 MG 24 hr capsule Commonly known as: Tiadylt  ER Take 1 capsule (300 mg total) by mouth daily.   Tiadylt  ER 300 MG 24 hr capsule Generic drug: diltiazem  Take 300 mg by  mouth daily.   traMADol  50 MG tablet Commonly known as: ULTRAM  TAKE 2 TABLETS(100 MG) BY MOUTH TWICE DAILY        Allergies:  Allergies  Allergen Reactions   Statins Other (See Comments)    Myalgias and memory problems   Ciprofloxacin  Itching, Swelling and Other (See Comments)    Other reaction(s): swelling/redness at injection site    Past Medical History, Surgical history, Social history, and Family History were reviewed and updated.  Review of Systems: Review of Systems  Constitutional: Negative.   HENT: Negative.    Eyes: Negative.   Respiratory: Negative.    Cardiovascular:  Positive for leg swelling.  Gastrointestinal: Negative.   Genitourinary: Negative.   Musculoskeletal:  Positive for back pain.  Skin: Negative.   Neurological: Negative.   Endo/Heme/Allergies: Negative.   Psychiatric/Behavioral: Negative.       Physical Exam:  height is 5' 1 (1.549 m) and weight is 129 lb (58.5 kg). Her oral temperature is 98.1 F (36.7 C). Her blood pressure is 133/43 (abnormal) and her pulse is 72. Her respiration is 19 and oxygen saturation is 98%.   Wt Readings from Last 3 Encounters:  11/16/23 129 lb (58.5 kg)  10/14/23 137 lb (62.1 kg)  08/30/23 141 lb (64 kg)    Physical Exam Vitals reviewed.  HENT:     Head: Normocephalic and atraumatic.   Eyes:     Pupils: Pupils are equal, round, and reactive to light.    Cardiovascular:     Rate and Rhythm: Normal rate and regular rhythm.     Heart sounds: Normal heart sounds.     Comments: Cardiac exam shows a regular rate and rhythm.  She has a 4/6 systolic ejection murmur.   Pulmonary:     Effort: Pulmonary effort is normal.     Breath sounds: Normal breath sounds.  Abdominal:     General: Bowel sounds are normal.     Palpations: Abdomen is soft.   Musculoskeletal:        General: No tenderness or deformity. Normal range of motion.     Cervical back: Normal range of motion.     Comments: She has  significant lymphedema in her legs.  She has probably 3+ in the left leg and 1+ in the right leg. She has pitting edema.  She has decent range of motion of her joints.  Lymphadenopathy:     Cervical: No cervical adenopathy.   Skin:    General: Skin is warm and dry.     Findings: No erythema or rash.   Neurological:     Mental Status: She is alert and oriented to person, place, and time.   Psychiatric:  Behavior: Behavior normal.        Thought Content: Thought content normal.        Judgment: Judgment normal.      Lab Results  Component Value Date   WBC 4.0 11/16/2023   HGB 8.2 (L) 11/16/2023   HCT 26.5 (L) 11/16/2023   MCV 87.2 11/16/2023   PLT 272 11/16/2023   Lab Results  Component Value Date   FERRITIN 294 10/14/2023   IRON  34 10/14/2023   TIBC 249 (L) 10/14/2023   UIBC 215 10/14/2023   IRONPCTSAT 14 10/14/2023   Lab Results  Component Value Date   RETICCTPCT 1.1 10/14/2023   RBC 3.04 (L) 11/16/2023   No results found for: KPAFRELGTCHN, LAMBDASER, KAPLAMBRATIO No results found for: IGGSERUM, IGA, IGMSERUM No results found for: Tanner Fanny, A1GS, Henretta Lodge, MSPIKE, SPEI   Chemistry      Component Value Date/Time   NA 140 11/16/2023 1120   NA 136 (A) 09/08/2017 0000   K 3.9 11/16/2023 1120   CL 105 11/16/2023 1120   CO2 29 11/16/2023 1120   BUN 20 11/16/2023 1120   BUN 8 09/08/2017 0000   CREATININE 0.93 11/16/2023 1120   CREATININE 0.69 08/02/2016 1519   GLU 111 09/08/2017 0000      Component Value Date/Time   CALCIUM 9.2 11/16/2023 1120   ALKPHOS 58 11/16/2023 1120   AST 13 (L) 11/16/2023 1120   ALT 14 11/16/2023 1120   BILITOT 0.2 11/16/2023 1120     Encounter Diagnoses  Name Primary?   Iron  deficiency anemia secondary to inadequate dietary iron  intake Yes   S/P CABG x 3    Iron  deficiency anemia, unspecified iron  deficiency anemia type    Endometrial cancer (HCC)     Port-A-Cath in place    Impression and Plan: Ms. Mcphee is a very pleasant 83 yo caucasian female with recurrent undifferentiated endometrial carcinoma.  She has Lynch syndrome.  She was on immunotherapy.  She tolerated this incredibly well.  It worked incredibly well. This has been on hold per pt request.  She also has multifactorial anemia secondary to iron  deficiency and erythropoetin deficiency.  Her erythropoietin  level was only 10.6.  Unfortunately, because of her cerebrovascular and cardiac disease her risk for ESA use is just too high. Today her Hgb is 8.2. Iron  levels pending. We will continue to monitor her levels closely. No blood needed tomorrow. We will investigate other potential causes with folate/b12 labs at follow up. RTC 2 weeks MD only, port labs (CBC w/, CMP, B12, folate, CA125). She likely would benefit from repeat imaging given her slowly elevating CA 125 value and unintentional weight loss.   Zometa  pending in target calcium level.  No blood needed tomorrow RTC 2 weeks MD only, port labs (CBC w/, CMP, B12, folate, CA125, sample to blood bank)*holding off on iron  labs due to them being drawn today.   Sharla Davis, PA-C 6/18/20252:04 PM

## 2023-11-21 ENCOUNTER — Encounter (HOSPITAL_BASED_OUTPATIENT_CLINIC_OR_DEPARTMENT_OTHER): Admitting: General Surgery

## 2023-11-21 DIAGNOSIS — Z09 Encounter for follow-up examination after completed treatment for conditions other than malignant neoplasm: Secondary | ICD-10-CM | POA: Diagnosis not present

## 2023-11-21 DIAGNOSIS — I890 Lymphedema, not elsewhere classified: Secondary | ICD-10-CM | POA: Diagnosis not present

## 2023-11-21 DIAGNOSIS — L97821 Non-pressure chronic ulcer of other part of left lower leg limited to breakdown of skin: Secondary | ICD-10-CM | POA: Diagnosis not present

## 2023-11-24 ENCOUNTER — Other Ambulatory Visit

## 2023-11-24 ENCOUNTER — Inpatient Hospital Stay: Attending: Hematology & Oncology

## 2023-11-24 ENCOUNTER — Ambulatory Visit: Admitting: Hematology & Oncology

## 2023-11-24 ENCOUNTER — Ambulatory Visit

## 2023-11-25 ENCOUNTER — Inpatient Hospital Stay

## 2023-11-25 VITALS — BP 115/82 | HR 58 | Temp 98.6°F

## 2023-11-25 DIAGNOSIS — Z79899 Other long term (current) drug therapy: Secondary | ICD-10-CM | POA: Diagnosis not present

## 2023-11-25 DIAGNOSIS — D508 Other iron deficiency anemias: Secondary | ICD-10-CM

## 2023-11-25 DIAGNOSIS — Z7951 Long term (current) use of inhaled steroids: Secondary | ICD-10-CM | POA: Diagnosis not present

## 2023-11-25 DIAGNOSIS — Z923 Personal history of irradiation: Secondary | ICD-10-CM | POA: Diagnosis not present

## 2023-11-25 DIAGNOSIS — R634 Abnormal weight loss: Secondary | ICD-10-CM | POA: Diagnosis not present

## 2023-11-25 DIAGNOSIS — R5383 Other fatigue: Secondary | ICD-10-CM | POA: Diagnosis not present

## 2023-11-25 DIAGNOSIS — C541 Malignant neoplasm of endometrium: Secondary | ICD-10-CM | POA: Diagnosis not present

## 2023-11-25 DIAGNOSIS — Z7901 Long term (current) use of anticoagulants: Secondary | ICD-10-CM | POA: Diagnosis not present

## 2023-11-25 DIAGNOSIS — Z7902 Long term (current) use of antithrombotics/antiplatelets: Secondary | ICD-10-CM | POA: Diagnosis not present

## 2023-11-25 MED ORDER — SODIUM CHLORIDE 0.9% FLUSH
3.0000 mL | Freq: Once | INTRAVENOUS | Status: DC | PRN
Start: 2023-11-25 — End: 2023-11-25

## 2023-11-25 MED ORDER — IRON SUCROSE 20 MG/ML IV SOLN
200.0000 mg | Freq: Once | INTRAVENOUS | Status: AC
  Administered 2023-11-25: 200 mg via INTRAVENOUS
  Filled 2023-11-25: qty 10

## 2023-11-25 MED ORDER — HEPARIN SOD (PORK) LOCK FLUSH 100 UNIT/ML IV SOLN
500.0000 [IU] | Freq: Once | INTRAVENOUS | Status: AC
  Administered 2023-11-25: 500 [IU] via INTRAVENOUS

## 2023-11-25 MED ORDER — SODIUM CHLORIDE 0.9% FLUSH
10.0000 mL | Freq: Once | INTRAVENOUS | Status: AC
  Administered 2023-11-25: 10 mL via INTRAVENOUS

## 2023-11-25 MED ORDER — HEPARIN SOD (PORK) LOCK FLUSH 100 UNIT/ML IV SOLN: 250.0000 [IU] | Freq: Once | INTRAVENOUS | Status: DC | PRN

## 2023-11-25 MED ORDER — SODIUM CHLORIDE 0.9 % IV SOLN: INTRAVENOUS | Status: DC

## 2023-11-25 NOTE — Patient Instructions (Signed)

## 2023-11-29 ENCOUNTER — Encounter: Payer: Self-pay | Admitting: Internal Medicine

## 2023-11-29 ENCOUNTER — Encounter: Payer: Self-pay | Admitting: Family

## 2023-12-01 ENCOUNTER — Ambulatory Visit: Admitting: Hematology & Oncology

## 2023-12-01 ENCOUNTER — Other Ambulatory Visit

## 2023-12-01 ENCOUNTER — Inpatient Hospital Stay: Attending: Hematology & Oncology

## 2023-12-01 VITALS — BP 135/42 | HR 65 | Temp 98.3°F | Resp 17

## 2023-12-01 DIAGNOSIS — G8929 Other chronic pain: Secondary | ICD-10-CM | POA: Diagnosis not present

## 2023-12-01 DIAGNOSIS — Z923 Personal history of irradiation: Secondary | ICD-10-CM | POA: Insufficient documentation

## 2023-12-01 DIAGNOSIS — Z1509 Genetic susceptibility to other malignant neoplasm: Secondary | ICD-10-CM | POA: Diagnosis not present

## 2023-12-01 DIAGNOSIS — Z7901 Long term (current) use of anticoagulants: Secondary | ICD-10-CM | POA: Insufficient documentation

## 2023-12-01 DIAGNOSIS — D509 Iron deficiency anemia, unspecified: Secondary | ICD-10-CM | POA: Insufficient documentation

## 2023-12-01 DIAGNOSIS — D508 Other iron deficiency anemias: Secondary | ICD-10-CM

## 2023-12-01 DIAGNOSIS — Z7951 Long term (current) use of inhaled steroids: Secondary | ICD-10-CM | POA: Diagnosis not present

## 2023-12-01 DIAGNOSIS — Z8542 Personal history of malignant neoplasm of other parts of uterus: Secondary | ICD-10-CM | POA: Diagnosis not present

## 2023-12-01 DIAGNOSIS — Z79899 Other long term (current) drug therapy: Secondary | ICD-10-CM | POA: Diagnosis not present

## 2023-12-01 MED ORDER — SODIUM CHLORIDE 0.9 % IV SOLN: INTRAVENOUS | Status: DC

## 2023-12-01 MED ORDER — IRON SUCROSE 20 MG/ML IV SOLN
200.0000 mg | Freq: Once | INTRAVENOUS | Status: AC
  Administered 2023-12-01: 200 mg via INTRAVENOUS
  Filled 2023-12-01: qty 10

## 2023-12-01 NOTE — Progress Notes (Signed)
 Patient requested a peripheral IV for this treatment instead of having her PAC accessed.

## 2023-12-04 ENCOUNTER — Other Ambulatory Visit: Payer: Self-pay | Admitting: Hematology & Oncology

## 2023-12-04 DIAGNOSIS — G8929 Other chronic pain: Secondary | ICD-10-CM

## 2023-12-04 DIAGNOSIS — C541 Malignant neoplasm of endometrium: Secondary | ICD-10-CM

## 2023-12-04 DIAGNOSIS — M545 Low back pain, unspecified: Secondary | ICD-10-CM

## 2023-12-04 DIAGNOSIS — M792 Neuralgia and neuritis, unspecified: Secondary | ICD-10-CM

## 2023-12-05 ENCOUNTER — Encounter: Payer: Self-pay | Admitting: Family

## 2023-12-05 ENCOUNTER — Encounter: Payer: Self-pay | Admitting: Internal Medicine

## 2023-12-16 ENCOUNTER — Inpatient Hospital Stay

## 2023-12-16 ENCOUNTER — Inpatient Hospital Stay: Admitting: Hematology & Oncology

## 2023-12-29 ENCOUNTER — Inpatient Hospital Stay

## 2023-12-29 ENCOUNTER — Inpatient Hospital Stay (HOSPITAL_BASED_OUTPATIENT_CLINIC_OR_DEPARTMENT_OTHER): Admitting: Hematology & Oncology

## 2023-12-29 ENCOUNTER — Other Ambulatory Visit: Payer: Self-pay | Admitting: *Deleted

## 2023-12-29 ENCOUNTER — Other Ambulatory Visit: Payer: Self-pay

## 2023-12-29 ENCOUNTER — Encounter: Payer: Self-pay | Admitting: Hematology & Oncology

## 2023-12-29 VITALS — BP 152/35 | HR 65 | Temp 99.4°F | Resp 18 | Ht 61.0 in | Wt 132.0 lb

## 2023-12-29 DIAGNOSIS — Z7951 Long term (current) use of inhaled steroids: Secondary | ICD-10-CM | POA: Diagnosis not present

## 2023-12-29 DIAGNOSIS — C539 Malignant neoplasm of cervix uteri, unspecified: Secondary | ICD-10-CM | POA: Diagnosis not present

## 2023-12-29 DIAGNOSIS — Z79899 Other long term (current) drug therapy: Secondary | ICD-10-CM | POA: Diagnosis not present

## 2023-12-29 DIAGNOSIS — D539 Nutritional anemia, unspecified: Secondary | ICD-10-CM

## 2023-12-29 DIAGNOSIS — C799 Secondary malignant neoplasm of unspecified site: Secondary | ICD-10-CM | POA: Diagnosis not present

## 2023-12-29 DIAGNOSIS — Z7901 Long term (current) use of anticoagulants: Secondary | ICD-10-CM | POA: Diagnosis not present

## 2023-12-29 DIAGNOSIS — D508 Other iron deficiency anemias: Secondary | ICD-10-CM

## 2023-12-29 DIAGNOSIS — D509 Iron deficiency anemia, unspecified: Secondary | ICD-10-CM | POA: Diagnosis not present

## 2023-12-29 DIAGNOSIS — Z923 Personal history of irradiation: Secondary | ICD-10-CM | POA: Diagnosis not present

## 2023-12-29 DIAGNOSIS — G8929 Other chronic pain: Secondary | ICD-10-CM | POA: Diagnosis not present

## 2023-12-29 DIAGNOSIS — C541 Malignant neoplasm of endometrium: Secondary | ICD-10-CM

## 2023-12-29 LAB — CBC WITH DIFFERENTIAL (CANCER CENTER ONLY)
Abs Immature Granulocytes: 0.03 K/uL (ref 0.00–0.07)
Basophils Absolute: 0 K/uL (ref 0.0–0.1)
Basophils Relative: 0 %
Eosinophils Absolute: 0 K/uL (ref 0.0–0.5)
Eosinophils Relative: 1 %
HCT: 25.7 % — ABNORMAL LOW (ref 36.0–46.0)
Hemoglobin: 8.1 g/dL — ABNORMAL LOW (ref 12.0–15.0)
Immature Granulocytes: 1 %
Lymphocytes Relative: 19 %
Lymphs Abs: 0.8 K/uL (ref 0.7–4.0)
MCH: 27.5 pg (ref 26.0–34.0)
MCHC: 31.5 g/dL (ref 30.0–36.0)
MCV: 87.1 fL (ref 80.0–100.0)
Monocytes Absolute: 0.3 K/uL (ref 0.1–1.0)
Monocytes Relative: 7 %
Neutro Abs: 3.3 K/uL (ref 1.7–7.7)
Neutrophils Relative %: 72 %
Platelet Count: 265 K/uL (ref 150–400)
RBC: 2.95 MIL/uL — ABNORMAL LOW (ref 3.87–5.11)
RDW: 16.4 % — ABNORMAL HIGH (ref 11.5–15.5)
WBC Count: 4.5 K/uL (ref 4.0–10.5)
nRBC: 0 % (ref 0.0–0.2)

## 2023-12-29 LAB — CMP (CANCER CENTER ONLY)
ALT: 6 U/L (ref 0–44)
AST: 15 U/L (ref 15–41)
Albumin: 3.8 g/dL (ref 3.5–5.0)
Alkaline Phosphatase: 66 U/L (ref 38–126)
Anion gap: 10 (ref 5–15)
BUN: 20 mg/dL (ref 8–23)
CO2: 26 mmol/L (ref 22–32)
Calcium: 9.1 mg/dL (ref 8.9–10.3)
Chloride: 103 mmol/L (ref 98–111)
Creatinine: 0.78 mg/dL (ref 0.44–1.00)
GFR, Estimated: >60 mL/min (ref >60)
Glucose, Bld: 101 mg/dL — ABNORMAL HIGH (ref 70–99)
Potassium: 4.3 mmol/L (ref 3.5–5.1)
Sodium: 138 mmol/L (ref 135–145)
Total Bilirubin: <0.2 mg/dL (ref 0.0–1.2)
Total Protein: 6.1 g/dL — ABNORMAL LOW (ref 6.5–8.1)

## 2023-12-29 LAB — VITAMIN B12: Vitamin B-12: 254 pg/mL (ref 180–914)

## 2023-12-29 LAB — FOLATE: Folate: 19 ng/mL (ref >5.9)

## 2023-12-29 LAB — SAMPLE TO BLOOD BANK

## 2023-12-29 LAB — PREPARE RBC (CROSSMATCH)

## 2023-12-29 MED ORDER — HEPARIN SOD (PORK) LOCK FLUSH 100 UNIT/ML IV SOLN
500.0000 [IU] | Freq: Once | INTRAVENOUS | Status: AC
  Administered 2023-12-29: 500 [IU] via INTRAVENOUS

## 2023-12-29 MED ORDER — SODIUM CHLORIDE 0.9% FLUSH
10.0000 mL | INTRAVENOUS | Status: DC | PRN
  Administered 2023-12-29: 10 mL via INTRAVENOUS

## 2023-12-29 NOTE — Progress Notes (Signed)
 Hematology and Oncology Follow Up Visit  Shannon Scott [id number] [birth year] 83 y.o. 12/29/2023   Principle Diagnosis:  Locally advanced/recurrent endometrial carcinoma undifferentiated --  TMB (HIGH) / MSI HIGH  Intermittent iron  deficiency anemia Erythropoietin  deficient anemia   Past Therapy: Radiation therapy for 5 -5 1/2 weeks - s/p 20 fractions Taxol /carboplatinum q 7 days -- s/p cycle 6  Aranesp 300 mcg subcu every 3 weeks for hemoglobin less than 11   Current Therapy:        Pembrolizumab  400 mg q 12 weeks, s/p cycle 20 (originally started on 01/23/2019 at 400 mg IV q 6 wk)  --on hold for patient request since April 2022 Zometa  4 mg IV q 12 months -- next dose due 01/2024 IV iron  as indicated --Venofer  - last infusion was on 12/01/2023    Interim History:  [name] is here today for follow-up.  Overall, she seems to be doing fairly well.  What I am worried about is her hemoglobin being lower.  Her hemoglobin is 8.1.  She says she has not bleeding as far she can tell.  She has a colostomy.  This is dark but this is but the material always has been dark inside this..  She has had no obvious bruising.  There is been no bleeding.  She has had no hemoptysis.  Her last iron  studies that were done in June showed a ferritin of 152 with an iron  saturation of 14%.  She did get some iron .  I noted that she had an erythropoietin  level 2 years ago that was incredibly low at 10.  As such, I think she may benefit from Aranesp.  She has had no problems with pain.  She is on Ultram  for some chronic pain issues.  Of note, her last CA-125 in May was 15.  Thankfully, her heart seems doing pretty well.  She continues on anticoagulation with Plavix  and Eliquis .  She is eating okay.  She is trying to make a conscientious effort to eat well.  Of note, she is on Repatha  for cholesterol.  Overall, I will have to say that her performance status is ECOG 2.   Wt Readings from Last 3  Encounters:  11/16/23 129 lb (58.5 kg)  10/14/23 137 lb (62.1 kg)  08/30/23 141 lb (64 kg)   Medications:  Allergies as of 12/29/2023       Reactions   Statins Other (See Comments)   Myalgias and memory problems   Ciprofloxacin  Itching, Swelling, Other (See Comments)   Other reaction(s): swelling/redness at injection site        Medication List        Accurate as of December 29, 2023  3:31 PM. If you have any questions, ask your nurse or doctor.          apixaban  5 MG Tabs tablet Commonly known as: ELIQUIS  Take 1 tablet (5 mg total) by mouth 2 (two) times daily.   clopidogrel  75 MG tablet Commonly known as: PLAVIX  Take 1 tablet (75 mg total) by mouth daily with breakfast.   diltiazem  300 MG 24 hr capsule Commonly known as: Cardizem  CD Take 1 capsule (300 mg total) by mouth daily.   furosemide  20 MG tablet Commonly known as: LASIX  Take 1 tablet (20 mg total) by mouth every Monday, Wednesday, and Friday.   levothyroxine  50 MCG tablet Commonly known as: SYNTHROID  Take 1 tablet (50 mcg total) by mouth daily.   mometasone  0.1 % cream Commonly known as: ELOCON  Apply  1 Application topically daily as needed (to the vulva for irritation and itching).   mupirocin  ointment 2 % Commonly known as: BACTROBAN  Apply 1 Application topically 2 (two) times daily.   Nexletol  180 MG Tabs Generic drug: Bempedoic Acid  Take 1 tablet (180 mg total) by mouth daily.   Nexlizet  180-10 MG Tabs Generic drug: Bempedoic Acid -Ezetimibe  Take 1 tablet by mouth daily.   nitroGLYCERIN  0.4 MG SL tablet Commonly known as: NITROSTAT  Place 1 tablet (0.4 mg total) under the tongue every 5 (five) minutes as needed for chest pain.   polyethylene glycol 17 g packet Commonly known as: MIRALAX  / GLYCOLAX  Take 17 g by mouth daily as needed for severe constipation. What changed: when to take this   Repatha  SureClick 140 MG/ML Soaj Generic drug: Evolocumab  Inject 140 mg into the skin every 14  (fourteen) days.   diltiazem  300 MG 24 hr capsule Commonly known as: Tiadylt  ER Take 1 capsule (300 mg total) by mouth daily.   Tiadylt  ER 300 MG 24 hr capsule Generic drug: diltiazem  Take 300 mg by mouth daily.   traMADol  50 MG tablet Commonly known as: ULTRAM  TAKE 2 TABLETS(100 MG) BY MOUTH TWICE DAILY        Allergies:  Allergies  Allergen Reactions   Statins Other (See Comments)    Myalgias and memory problems   Ciprofloxacin  Itching, Swelling and Other (See Comments)    Other reaction(s): swelling/redness at injection site    Past Medical History, Surgical history, Social history, and Family History were reviewed and updated.  Review of Systems: Review of Systems  Constitutional: Negative.   HENT: Negative.    Eyes: Negative.   Respiratory: Negative.    Cardiovascular:  Positive for leg swelling.  Gastrointestinal: Negative.   Genitourinary: Negative.   Musculoskeletal:  Positive for back pain.  Skin: Negative.   Neurological: Negative.   Endo/Heme/Allergies: Negative.   Psychiatric/Behavioral: Negative.       Physical Exam:  Temperature 99.4.  Pulse 65.  Blood pressure 152/35.  Weight is 132 lb.  Wt Readings from Last 3 Encounters:  11/16/23 129 lb (58.5 kg)  10/14/23 137 lb (62.1 kg)  08/30/23 141 lb (64 kg)    Physical Exam Vitals reviewed.  HENT:     Head: Normocephalic and atraumatic.  Eyes:     Pupils: Pupils are equal, round, and reactive to light.  Cardiovascular:     Rate and Rhythm: Normal rate and regular rhythm.     Heart sounds: Normal heart sounds.     Comments: Cardiac exam shows a regular rate and rhythm.  She has a 4/6 systolic ejection murmur.   Pulmonary:     Effort: Pulmonary effort is normal.     Breath sounds: Normal breath sounds.  Abdominal:     General: Bowel sounds are normal.     Palpations: Abdomen is soft.  Musculoskeletal:        General: No tenderness or deformity. Normal range of motion.     Cervical back:  Normal range of motion.     Comments: She has significant lymphedema in her legs.  She has probably 3+ in the left leg and 1+ in the right leg. She has pitting edema.  She has decent range of motion of her joints.  Lymphadenopathy:     Cervical: No cervical adenopathy.  Skin:    General: Skin is warm and dry.     Findings: No erythema or rash.  Neurological:     Mental Status: She  is alert and oriented to person, place, and time.  Psychiatric:        Behavior: Behavior normal.        Thought Content: Thought content normal.        Judgment: Judgment normal.      Lab Results  Component Value Date   WBC 4.0 11/16/2023   HGB 8.2 (L) 11/16/2023   HCT 26.5 (L) 11/16/2023   MCV 87.2 11/16/2023   PLT 272 11/16/2023   Lab Results  Component Value Date   FERRITIN 152 11/16/2023   IRON  36 11/16/2023   TIBC 249 (L) 11/16/2023   UIBC 213 11/16/2023   IRONPCTSAT 14 11/16/2023   Lab Results  Component Value Date   RETICCTPCT 1.1 10/14/2023   RBC 3.04 (L) 11/16/2023   No results found for: KPAFRELGTCHN, LAMBDASER, KAPLAMBRATIO No results found for: IGGSERUM, IGA, IGMSERUM No results found for: STEPHANY CARLOTA BENSON MARKEL EARLA JOANNIE DOC VICK, SPEI   Chemistry      Component Value Date/Time   NA 140 11/16/2023 1120   NA 136 (A) 09/08/2017 0000   K 3.9 11/16/2023 1120   CL 105 11/16/2023 1120   CO2 29 11/16/2023 1120   BUN 20 11/16/2023 1120   BUN 8 09/08/2017 0000   CREATININE 0.93 11/16/2023 1120   CREATININE 0.69 08/02/2016 1519   GLU 111 09/08/2017 0000      Component Value Date/Time   CALCIUM 9.2 11/16/2023 1120   ALKPHOS 58 11/16/2023 1120   AST 13 (L) 11/16/2023 1120   ALT 14 11/16/2023 1120   BILITOT 0.2 11/16/2023 1120       Impression and Plan: Ms. Bible is a very pleasant 83 yo caucasian female with recurrent undifferentiated endometrial carcinoma.  She has Lynch syndrome.  She was on immunotherapy.  She  tolerated this incredibly well.  It worked incredibly well. This has been on hold per pt request.  Again, I worry about the anemia.  We really have to work on this.  With her cardiac issues, I really do not think we can have her hemoglobin this low.  I think we will put way too much stress on her heart.  I talked to her about a transfusion.  Her daughter was with her.  She is in agreement.  We will go ahead and give her 1 unit of blood.  I will see about doing Aranesp.  I do not see an actual contraindication for Aranesp right now.  Again, we will have to watch her anemia very closely.  She will be transfused.  As far as her cancer is concerned, I really do not think this is much of a problem.  We have had no evidence of recurrence.  I would like to see her back in 1 month.    Maude JONELLE Crease, MD 7/31/20253:31 PM

## 2023-12-29 NOTE — Patient Instructions (Signed)

## 2023-12-30 ENCOUNTER — Telehealth: Payer: Self-pay | Admitting: Hematology & Oncology

## 2023-12-30 ENCOUNTER — Inpatient Hospital Stay: Attending: Hematology & Oncology

## 2023-12-30 VITALS — BP 152/36 | HR 73 | Temp 97.9°F | Resp 16

## 2023-12-30 DIAGNOSIS — D509 Iron deficiency anemia, unspecified: Secondary | ICD-10-CM | POA: Diagnosis not present

## 2023-12-30 DIAGNOSIS — I129 Hypertensive chronic kidney disease with stage 1 through stage 4 chronic kidney disease, or unspecified chronic kidney disease: Secondary | ICD-10-CM | POA: Diagnosis not present

## 2023-12-30 DIAGNOSIS — N189 Chronic kidney disease, unspecified: Secondary | ICD-10-CM | POA: Insufficient documentation

## 2023-12-30 DIAGNOSIS — Z79899 Other long term (current) drug therapy: Secondary | ICD-10-CM | POA: Insufficient documentation

## 2023-12-30 DIAGNOSIS — E538 Deficiency of other specified B group vitamins: Secondary | ICD-10-CM | POA: Diagnosis not present

## 2023-12-30 DIAGNOSIS — D631 Anemia in chronic kidney disease: Secondary | ICD-10-CM | POA: Insufficient documentation

## 2023-12-30 DIAGNOSIS — M7989 Other specified soft tissue disorders: Secondary | ICD-10-CM | POA: Insufficient documentation

## 2023-12-30 DIAGNOSIS — I890 Lymphedema, not elsewhere classified: Secondary | ICD-10-CM | POA: Diagnosis not present

## 2023-12-30 DIAGNOSIS — D508 Other iron deficiency anemias: Secondary | ICD-10-CM

## 2023-12-30 DIAGNOSIS — C539 Malignant neoplasm of cervix uteri, unspecified: Secondary | ICD-10-CM

## 2023-12-30 DIAGNOSIS — I872 Venous insufficiency (chronic) (peripheral): Secondary | ICD-10-CM | POA: Diagnosis not present

## 2023-12-30 DIAGNOSIS — Z923 Personal history of irradiation: Secondary | ICD-10-CM | POA: Diagnosis not present

## 2023-12-30 LAB — CA 125: Cancer Antigen (CA) 125: 16.4 U/mL (ref 0.0–38.1)

## 2023-12-30 MED ORDER — DIPHENHYDRAMINE HCL 25 MG PO CAPS
25.0000 mg | ORAL_CAPSULE | Freq: Once | ORAL | Status: AC
  Administered 2023-12-30: 25 mg via ORAL
  Filled 2023-12-30: qty 1

## 2023-12-30 MED ORDER — ACETAMINOPHEN 325 MG PO TABS
650.0000 mg | ORAL_TABLET | Freq: Once | ORAL | Status: AC
  Administered 2023-12-30: 650 mg via ORAL
  Filled 2023-12-30: qty 2

## 2023-12-30 MED ORDER — HEPARIN SOD (PORK) LOCK FLUSH 100 UNIT/ML IV SOLN
500.0000 [IU] | Freq: Every day | INTRAVENOUS | Status: AC | PRN
  Administered 2023-12-30: 500 [IU]

## 2023-12-30 MED ORDER — DARBEPOETIN ALFA 300 MCG/0.6ML IJ SOSY
300.0000 ug | PREFILLED_SYRINGE | Freq: Once | INTRAMUSCULAR | Status: AC
  Administered 2023-12-30: 300 ug via SUBCUTANEOUS
  Filled 2023-12-30: qty 0.6

## 2023-12-30 MED ORDER — SODIUM CHLORIDE 0.9% FLUSH
10.0000 mL | INTRAVENOUS | Status: AC | PRN
  Administered 2023-12-30: 10 mL

## 2023-12-30 MED ORDER — SODIUM CHLORIDE 0.9% IV SOLUTION
250.0000 mL | INTRAVENOUS | Status: DC
  Administered 2023-12-30: 250 mL via INTRAVENOUS

## 2023-12-30 NOTE — Telephone Encounter (Signed)
 Called to schedule patient for a follow up per 12/29/23 los and patient declined scheduling at this time. She said she will call back once she gets transportation lined up to schedule appointment.

## 2023-12-30 NOTE — Patient Instructions (Signed)

## 2024-01-02 ENCOUNTER — Ambulatory Visit: Payer: Self-pay | Admitting: Medical Oncology

## 2024-01-02 ENCOUNTER — Other Ambulatory Visit: Payer: Self-pay | Admitting: Medical Oncology

## 2024-01-02 LAB — BPAM RBC
Blood Product Expiration Date: 202509022359
ISSUE DATE / TIME: 202508010737
Unit Type and Rh: 5100

## 2024-01-02 LAB — TYPE AND SCREEN
ABO/RH(D): O POS
Antibody Screen: NEGATIVE
Unit division: 0

## 2024-01-10 ENCOUNTER — Other Ambulatory Visit: Payer: Self-pay

## 2024-01-10 ENCOUNTER — Other Ambulatory Visit: Payer: Self-pay | Admitting: Internal Medicine

## 2024-01-10 DIAGNOSIS — I25709 Atherosclerosis of coronary artery bypass graft(s), unspecified, with unspecified angina pectoris: Secondary | ICD-10-CM

## 2024-01-10 DIAGNOSIS — E785 Hyperlipidemia, unspecified: Secondary | ICD-10-CM

## 2024-01-10 DIAGNOSIS — Z8673 Personal history of transient ischemic attack (TIA), and cerebral infarction without residual deficits: Secondary | ICD-10-CM

## 2024-01-12 ENCOUNTER — Inpatient Hospital Stay

## 2024-01-12 VITALS — BP 142/41 | HR 64 | Temp 98.7°F | Resp 18

## 2024-01-12 DIAGNOSIS — I872 Venous insufficiency (chronic) (peripheral): Secondary | ICD-10-CM | POA: Diagnosis not present

## 2024-01-12 DIAGNOSIS — N189 Chronic kidney disease, unspecified: Secondary | ICD-10-CM | POA: Diagnosis not present

## 2024-01-12 DIAGNOSIS — D508 Other iron deficiency anemias: Secondary | ICD-10-CM

## 2024-01-12 DIAGNOSIS — E538 Deficiency of other specified B group vitamins: Secondary | ICD-10-CM | POA: Diagnosis not present

## 2024-01-12 DIAGNOSIS — I129 Hypertensive chronic kidney disease with stage 1 through stage 4 chronic kidney disease, or unspecified chronic kidney disease: Secondary | ICD-10-CM | POA: Diagnosis not present

## 2024-01-12 DIAGNOSIS — I890 Lymphedema, not elsewhere classified: Secondary | ICD-10-CM | POA: Diagnosis not present

## 2024-01-12 DIAGNOSIS — Z923 Personal history of irradiation: Secondary | ICD-10-CM | POA: Diagnosis not present

## 2024-01-12 DIAGNOSIS — M7989 Other specified soft tissue disorders: Secondary | ICD-10-CM | POA: Diagnosis not present

## 2024-01-12 DIAGNOSIS — D509 Iron deficiency anemia, unspecified: Secondary | ICD-10-CM | POA: Diagnosis not present

## 2024-01-12 DIAGNOSIS — Z79899 Other long term (current) drug therapy: Secondary | ICD-10-CM | POA: Diagnosis not present

## 2024-01-12 DIAGNOSIS — D631 Anemia in chronic kidney disease: Secondary | ICD-10-CM | POA: Diagnosis not present

## 2024-01-12 MED ORDER — CYANOCOBALAMIN 1000 MCG/ML IJ SOLN
1000.0000 ug | Freq: Once | INTRAMUSCULAR | Status: AC
  Administered 2024-01-12: 1000 ug via INTRAMUSCULAR
  Filled 2024-01-12: qty 1

## 2024-01-12 NOTE — Patient Instructions (Signed)
 Vitamin B12 Injection What is this medication? Vitamin B12 (VAHY tuh min B12) prevents and treats low vitamin B12 levels in your body. It is used in people who do not get enough vitamin B12 from their diet or when their digestive tract does not absorb enough. Vitamin B12 plays an important role in maintaining the health of your nervous system and red blood cells. This medicine may be used for other purposes; ask your health care provider or pharmacist if you have questions. COMMON BRAND NAME(S): B-12 Compliance Kit, B-12 Injection Kit, Cyomin, Dodex , LA-12, Nutri-Twelve, Physicians EZ Use B-12, Primabalt, Vitamin Deficiency Injectable System - B12 What should I tell my care team before I take this medication? They need to know if you have any of these conditions: Kidney disease Leber's disease Megaloblastic anemia An unusual or allergic reaction to cyanocobalamin , cobalt, other medications, foods, dyes, or preservatives Pregnant or trying to get pregnant Breast-feeding How should I use this medication? This medication is injected into a muscle or deeply under the skin. It is usually given in a clinic or care team's office. However, your care team may teach you how to inject yourself. Follow all instructions. Talk to your care team about the use of this medication in children. Special care may be needed. Overdosage: If you think you have taken too much of this medicine contact a poison control center or emergency room at once. NOTE: This medicine is only for you. Do not share this medicine with others. What if I miss a dose? If you are given your dose at a clinic or care team's office, call to reschedule your appointment. If you give your own injections, and you miss a dose, take it as soon as you can. If it is almost time for your next dose, take only that dose. Do not take double or extra doses. What may interact with this medication? Alcohol Colchicine This list may not describe all possible  interactions. Give your health care provider a list of all the medicines, herbs, non-prescription drugs, or dietary supplements you use. Also tell them if you smoke, drink alcohol, or use illegal drugs. Some items may interact with your medicine. What should I watch for while using this medication? Visit your care team regularly. You may need blood work done while you are taking this medication. You may need to follow a special diet. Talk to your care team. Limit your alcohol intake and avoid smoking to get the best benefit. What side effects may I notice from receiving this medication? Side effects that you should report to your care team as soon as possible: Allergic reactions--skin rash, itching, hives, swelling of the face, lips, tongue, or throat Swelling of the ankles, hands, or feet Trouble breathing Side effects that usually do not require medical attention (report to your care team if they continue or are bothersome): Diarrhea This list may not describe all possible side effects. Call your doctor for medical advice about side effects. You may report side effects to FDA at 1-800-FDA-1088. Where should I keep my medication? Keep out of the reach of children. Store at room temperature between 15 and 30 degrees C (59 and 85 degrees F). Protect from light. Throw away any unused medication after the expiration date. NOTE: This sheet is a summary. It may not cover all possible information. If you have questions about this medicine, talk to your doctor, pharmacist, or health care provider.  2024 Elsevier/Gold Standard (2021-01-27 00:00:00)

## 2024-01-19 ENCOUNTER — Inpatient Hospital Stay

## 2024-01-19 VITALS — BP 133/51 | HR 69 | Temp 98.4°F | Resp 18

## 2024-01-19 DIAGNOSIS — Z923 Personal history of irradiation: Secondary | ICD-10-CM | POA: Diagnosis not present

## 2024-01-19 DIAGNOSIS — I890 Lymphedema, not elsewhere classified: Secondary | ICD-10-CM | POA: Diagnosis not present

## 2024-01-19 DIAGNOSIS — I129 Hypertensive chronic kidney disease with stage 1 through stage 4 chronic kidney disease, or unspecified chronic kidney disease: Secondary | ICD-10-CM | POA: Diagnosis not present

## 2024-01-19 DIAGNOSIS — N189 Chronic kidney disease, unspecified: Secondary | ICD-10-CM | POA: Diagnosis not present

## 2024-01-19 DIAGNOSIS — D509 Iron deficiency anemia, unspecified: Secondary | ICD-10-CM | POA: Diagnosis not present

## 2024-01-19 DIAGNOSIS — I872 Venous insufficiency (chronic) (peripheral): Secondary | ICD-10-CM | POA: Diagnosis not present

## 2024-01-19 DIAGNOSIS — E538 Deficiency of other specified B group vitamins: Secondary | ICD-10-CM | POA: Diagnosis not present

## 2024-01-19 DIAGNOSIS — Z79899 Other long term (current) drug therapy: Secondary | ICD-10-CM | POA: Diagnosis not present

## 2024-01-19 DIAGNOSIS — D631 Anemia in chronic kidney disease: Secondary | ICD-10-CM | POA: Diagnosis not present

## 2024-01-19 DIAGNOSIS — M7989 Other specified soft tissue disorders: Secondary | ICD-10-CM | POA: Diagnosis not present

## 2024-01-19 DIAGNOSIS — D508 Other iron deficiency anemias: Secondary | ICD-10-CM

## 2024-01-19 MED ORDER — CYANOCOBALAMIN 1000 MCG/ML IJ SOLN
1000.0000 ug | Freq: Once | INTRAMUSCULAR | Status: AC
  Administered 2024-01-19: 1000 ug via INTRAMUSCULAR

## 2024-01-19 NOTE — Patient Instructions (Signed)
 Vitamin B12 Injection What is this medication? Vitamin B12 (VAHY tuh min B12) prevents and treats low vitamin B12 levels in your body. It is used in people who do not get enough vitamin B12 from their diet or when their digestive tract does not absorb enough. Vitamin B12 plays an important role in maintaining the health of your nervous system and red blood cells. This medicine may be used for other purposes; ask your health care provider or pharmacist if you have questions. COMMON BRAND NAME(S): B-12 Compliance Kit, B-12 Injection Kit, Cyomin, Dodex , LA-12, Nutri-Twelve, Physicians EZ Use B-12, Primabalt, Vitamin Deficiency Injectable System - B12 What should I tell my care team before I take this medication? They need to know if you have any of these conditions: Kidney disease Leber's disease Megaloblastic anemia An unusual or allergic reaction to cyanocobalamin , cobalt, other medications, foods, dyes, or preservatives Pregnant or trying to get pregnant Breast-feeding How should I use this medication? This medication is injected into a muscle or deeply under the skin. It is usually given in a clinic or care team's office. However, your care team may teach you how to inject yourself. Follow all instructions. Talk to your care team about the use of this medication in children. Special care may be needed. Overdosage: If you think you have taken too much of this medicine contact a poison control center or emergency room at once. NOTE: This medicine is only for you. Do not share this medicine with others. What if I miss a dose? If you are given your dose at a clinic or care team's office, call to reschedule your appointment. If you give your own injections, and you miss a dose, take it as soon as you can. If it is almost time for your next dose, take only that dose. Do not take double or extra doses. What may interact with this medication? Alcohol Colchicine This list may not describe all possible  interactions. Give your health care provider a list of all the medicines, herbs, non-prescription drugs, or dietary supplements you use. Also tell them if you smoke, drink alcohol, or use illegal drugs. Some items may interact with your medicine. What should I watch for while using this medication? Visit your care team regularly. You may need blood work done while you are taking this medication. You may need to follow a special diet. Talk to your care team. Limit your alcohol intake and avoid smoking to get the best benefit. What side effects may I notice from receiving this medication? Side effects that you should report to your care team as soon as possible: Allergic reactions--skin rash, itching, hives, swelling of the face, lips, tongue, or throat Swelling of the ankles, hands, or feet Trouble breathing Side effects that usually do not require medical attention (report to your care team if they continue or are bothersome): Diarrhea This list may not describe all possible side effects. Call your doctor for medical advice about side effects. You may report side effects to FDA at 1-800-FDA-1088. Where should I keep my medication? Keep out of the reach of children. Store at room temperature between 15 and 30 degrees C (59 and 85 degrees F). Protect from light. Throw away any unused medication after the expiration date. NOTE: This sheet is a summary. It may not cover all possible information. If you have questions about this medicine, talk to your doctor, pharmacist, or health care provider.  2024 Elsevier/Gold Standard (2021-01-27 00:00:00)

## 2024-01-26 ENCOUNTER — Inpatient Hospital Stay (HOSPITAL_BASED_OUTPATIENT_CLINIC_OR_DEPARTMENT_OTHER): Admitting: Hematology & Oncology

## 2024-01-26 ENCOUNTER — Inpatient Hospital Stay

## 2024-01-26 VITALS — BP 155/69 | HR 66 | Temp 98.7°F | Resp 17 | Ht 61.0 in

## 2024-01-26 DIAGNOSIS — C799 Secondary malignant neoplasm of unspecified site: Secondary | ICD-10-CM

## 2024-01-26 DIAGNOSIS — D509 Iron deficiency anemia, unspecified: Secondary | ICD-10-CM | POA: Diagnosis not present

## 2024-01-26 DIAGNOSIS — C541 Malignant neoplasm of endometrium: Secondary | ICD-10-CM | POA: Diagnosis not present

## 2024-01-26 DIAGNOSIS — C539 Malignant neoplasm of cervix uteri, unspecified: Secondary | ICD-10-CM | POA: Diagnosis not present

## 2024-01-26 DIAGNOSIS — I872 Venous insufficiency (chronic) (peripheral): Secondary | ICD-10-CM | POA: Diagnosis not present

## 2024-01-26 DIAGNOSIS — D510 Vitamin B12 deficiency anemia due to intrinsic factor deficiency: Secondary | ICD-10-CM

## 2024-01-26 DIAGNOSIS — E538 Deficiency of other specified B group vitamins: Secondary | ICD-10-CM | POA: Diagnosis not present

## 2024-01-26 DIAGNOSIS — M7989 Other specified soft tissue disorders: Secondary | ICD-10-CM | POA: Diagnosis not present

## 2024-01-26 DIAGNOSIS — D508 Other iron deficiency anemias: Secondary | ICD-10-CM

## 2024-01-26 DIAGNOSIS — N189 Chronic kidney disease, unspecified: Secondary | ICD-10-CM | POA: Diagnosis not present

## 2024-01-26 DIAGNOSIS — Z79899 Other long term (current) drug therapy: Secondary | ICD-10-CM | POA: Diagnosis not present

## 2024-01-26 DIAGNOSIS — I890 Lymphedema, not elsewhere classified: Secondary | ICD-10-CM | POA: Diagnosis not present

## 2024-01-26 DIAGNOSIS — I129 Hypertensive chronic kidney disease with stage 1 through stage 4 chronic kidney disease, or unspecified chronic kidney disease: Secondary | ICD-10-CM | POA: Diagnosis not present

## 2024-01-26 DIAGNOSIS — Z923 Personal history of irradiation: Secondary | ICD-10-CM | POA: Diagnosis not present

## 2024-01-26 DIAGNOSIS — D631 Anemia in chronic kidney disease: Secondary | ICD-10-CM | POA: Diagnosis not present

## 2024-01-26 LAB — IRON AND IRON BINDING CAPACITY (CC-WL,HP ONLY)
Iron: 20 ug/dL — ABNORMAL LOW (ref 28–170)
Saturation Ratios: 7 % — ABNORMAL LOW (ref 10.4–31.8)
TIBC: 270 ug/dL (ref 250–450)
UIBC: 250 ug/dL

## 2024-01-26 LAB — CBC WITH DIFFERENTIAL (CANCER CENTER ONLY)
Abs Immature Granulocytes: 0.03 K/uL (ref 0.00–0.07)
Basophils Absolute: 0 K/uL (ref 0.0–0.1)
Basophils Relative: 1 %
Eosinophils Absolute: 0 K/uL (ref 0.0–0.5)
Eosinophils Relative: 1 %
HCT: 33.4 % — ABNORMAL LOW (ref 36.0–46.0)
Hemoglobin: 10.5 g/dL — ABNORMAL LOW (ref 12.0–15.0)
Immature Granulocytes: 1 %
Lymphocytes Relative: 19 %
Lymphs Abs: 0.8 K/uL (ref 0.7–4.0)
MCH: 27.1 pg (ref 26.0–34.0)
MCHC: 31.4 g/dL (ref 30.0–36.0)
MCV: 86.1 fL (ref 80.0–100.0)
Monocytes Absolute: 0.3 K/uL (ref 0.1–1.0)
Monocytes Relative: 7 %
Neutro Abs: 3.1 K/uL (ref 1.7–7.7)
Neutrophils Relative %: 71 %
Platelet Count: 265 K/uL (ref 150–400)
RBC: 3.88 MIL/uL (ref 3.87–5.11)
RDW: 15.7 % — ABNORMAL HIGH (ref 11.5–15.5)
WBC Count: 4.3 K/uL (ref 4.0–10.5)
nRBC: 0 % (ref 0.0–0.2)

## 2024-01-26 LAB — RETICULOCYTES
Immature Retic Fract: 7.3 % (ref 2.3–15.9)
RBC.: 3.87 MIL/uL (ref 3.87–5.11)
Retic Count, Absolute: 29.8 K/uL (ref 19.0–186.0)
Retic Ct Pct: 0.8 % (ref 0.4–3.1)

## 2024-01-26 LAB — CMP (CANCER CENTER ONLY)
ALT: 7 U/L (ref 0–44)
AST: 16 U/L (ref 15–41)
Albumin: 3.9 g/dL (ref 3.5–5.0)
Alkaline Phosphatase: 79 U/L (ref 38–126)
Anion gap: 10 (ref 5–15)
BUN: 19 mg/dL (ref 8–23)
CO2: 26 mmol/L (ref 22–32)
Calcium: 9.2 mg/dL (ref 8.9–10.3)
Chloride: 104 mmol/L (ref 98–111)
Creatinine: 0.73 mg/dL (ref 0.44–1.00)
GFR, Estimated: >60 mL/min (ref >60)
Glucose, Bld: 97 mg/dL (ref 70–99)
Potassium: 4.3 mmol/L (ref 3.5–5.1)
Sodium: 140 mmol/L (ref 135–145)
Total Bilirubin: 0.2 mg/dL (ref 0.0–1.2)
Total Protein: 6.3 g/dL — ABNORMAL LOW (ref 6.5–8.1)

## 2024-01-26 LAB — FERRITIN: Ferritin: 104 ng/mL (ref 11–307)

## 2024-01-26 MED ORDER — DARBEPOETIN ALFA 300 MCG/0.6ML IJ SOSY
300.0000 ug | PREFILLED_SYRINGE | Freq: Once | INTRAMUSCULAR | Status: AC
  Administered 2024-01-26: 300 ug via SUBCUTANEOUS
  Filled 2024-01-26: qty 0.6

## 2024-01-26 MED ORDER — CYANOCOBALAMIN 1000 MCG/ML IJ SOLN
1000.0000 ug | Freq: Once | INTRAMUSCULAR | Status: AC
  Administered 2024-01-26: 1000 ug via INTRAMUSCULAR
  Filled 2024-01-26: qty 1

## 2024-01-26 NOTE — Progress Notes (Signed)
 Hematology and Oncology Follow Up Visit  Shannon Scott [id number] 05/17/1941 83 y.o. 01/26/2024   Principle Diagnosis:  Locally advanced/recurrent endometrial carcinoma undifferentiated --  TMB (HIGH) / MSI HIGH  Intermittent iron  deficiency anemia Erythropoietin  deficient anemia   Past Therapy: Radiation therapy for 5 -5 1/2 weeks - s/p 20 fractions Taxol /carboplatinum q 7 days -- s/p cycle 6  Aranesp  300 mcg subcu every 3 weeks for hemoglobin less than 11   Current Therapy:        Pembrolizumab  400 mg q 12 weeks, s/p cycle 20 (originally started on 01/23/2019 at 400 mg IV q 6 wk)  --on hold for patient request since April 2022 Zometa  4 mg IV q 12 months -- next dose due 01/2024 IV iron  as indicated --Venofer  - last infusion was on 12/01/2023  Aranesp  300 mcg SQ every 3 weeks for hemoglobin less than 11   Interim History:  Shannon Scott is here today for follow-up.  She is worried about weight loss.  We did not weigh her today.  However, she thinks that she is losing weight.  She is worried about her cancer coming back.  We will have to get a CT scan on her.  I think her last CT scan was done over a year ago.  She does get IV iron .  She does get Aranesp .  I really do think that the Aranesp  does help her out.  When we last saw her, her ferritin was 152 with an iron  saturation of 14%.  She is eating okay.  She has had no problems with nausea or vomiting.  She has had no cough or shortness of breath..  She has a lot of swelling in the left leg.  She has a lymphedema in the left leg.  She has had some cellulitis in that leg.  Mostly, she has some stasis dermatitis.  She has had no swollen lymph nodes.  She has had no obvious change in bowel or bladder habits.  Overall, I would have to say that her performance status is probably ECOG 2.      Wt Readings from Last 3 Encounters:  12/29/23 132 lb (59.9 kg)  11/16/23 129 lb (58.5 kg)  10/14/23 137 lb (62.1 kg)   Medications:   Allergies as of 01/26/2024       Reactions   Statins Other (See Comments)   Myalgias and memory problems   Ciprofloxacin  Itching, Swelling, Other (See Comments)   Other reaction(s): swelling/redness at injection site        Medication List        Accurate as of January 26, 2024  4:23 PM. If you have any questions, ask your nurse or doctor.          apixaban  5 MG Tabs tablet Commonly known as: ELIQUIS  Take 1 tablet (5 mg total) by mouth 2 (two) times daily.   clopidogrel  75 MG tablet Commonly known as: PLAVIX  Take 1 tablet (75 mg total) by mouth daily with breakfast.   diltiazem  300 MG 24 hr capsule Commonly known as: Cardizem  CD Take 1 capsule (300 mg total) by mouth daily.   furosemide  20 MG tablet Commonly known as: LASIX  Take 1 tablet (20 mg total) by mouth every Monday, Wednesday, and Friday.   levothyroxine  50 MCG tablet Commonly known as: SYNTHROID  Take 1 tablet (50 mcg total) by mouth daily.   mometasone  0.1 % cream Commonly known as: ELOCON  Apply 1 Application topically daily as needed (to the vulva for irritation  and itching).   mupirocin  ointment 2 % Commonly known as: BACTROBAN  Apply 1 Application topically 2 (two) times daily.   Nexletol  180 MG Tabs Generic drug: Bempedoic Acid  Take 1 tablet (180 mg total) by mouth daily.   Nexlizet  180-10 MG Tabs Generic drug: Bempedoic Acid -Ezetimibe  Take 1 tablet by mouth daily.   nitroGLYCERIN  0.4 MG SL tablet Commonly known as: NITROSTAT  Place 1 tablet (0.4 mg total) under the tongue every 5 (five) minutes as needed for chest pain.   polyethylene glycol 17 g packet Commonly known as: MIRALAX  / GLYCOLAX  Take 17 g by mouth daily as needed for severe constipation. What changed: when to take this   Repatha  SureClick 140 MG/ML Soaj Generic drug: Evolocumab  INJECT 1 DOSE UNDER THE SKIN EVERY 14 DAYS   diltiazem  300 MG 24 hr capsule Commonly known as: Tiadylt  ER Take 1 capsule (300 mg total) by mouth  daily.   Tiadylt  ER 300 MG 24 hr capsule Generic drug: diltiazem  Take 300 mg by mouth daily.   traMADol  50 MG tablet Commonly known as: ULTRAM  TAKE 2 TABLETS(100 MG) BY MOUTH TWICE DAILY        Allergies:  Allergies  Allergen Reactions   Statins Other (See Comments)    Myalgias and memory problems   Ciprofloxacin  Itching, Swelling and Other (See Comments)    Other reaction(s): swelling/redness at injection site    Past Medical History, Surgical history, Social history, and Family History were reviewed and updated.  Review of Systems: Review of Systems  Constitutional: Negative.   HENT: Negative.    Eyes: Negative.   Respiratory: Negative.    Cardiovascular:  Positive for leg swelling.  Gastrointestinal: Negative.   Genitourinary: Negative.   Musculoskeletal:  Positive for back pain.  Skin: Negative.   Neurological: Negative.   Endo/Heme/Allergies: Negative.   Psychiatric/Behavioral: Negative.       Physical Exam:  Temperature 98.7.  Pulse 66.  Blood pressure 155/69.  Weight is pending.    Wt Readings from Last 3 Encounters:  12/29/23 132 lb (59.9 kg)  11/16/23 129 lb (58.5 kg)  10/14/23 137 lb (62.1 kg)    Physical Exam Vitals reviewed.  HENT:     Head: Normocephalic and atraumatic.  Eyes:     Pupils: Pupils are equal, round, and reactive to light.  Cardiovascular:     Rate and Rhythm: Normal rate and regular rhythm.     Heart sounds: Normal heart sounds.     Comments: Cardiac exam shows a regular rate and rhythm.  She has a 4/6 systolic ejection murmur.   Pulmonary:     Effort: Pulmonary effort is normal.     Breath sounds: Normal breath sounds.  Abdominal:     General: Bowel sounds are normal.     Palpations: Abdomen is soft.  Musculoskeletal:        General: No tenderness or deformity. Normal range of motion.     Cervical back: Normal range of motion.     Comments: She has significant lymphedema in her legs.  She has probably 3+ in the left  leg and 1+ in the right leg. She has pitting edema.  She has decent range of motion of her joints.  Lymphadenopathy:     Cervical: No cervical adenopathy.  Skin:    General: Skin is warm and dry.     Findings: No erythema or rash.  Neurological:     Mental Status: She is alert and oriented to person, place, and time.  Psychiatric:  Behavior: Behavior normal.        Thought Content: Thought content normal.        Judgment: Judgment normal.      Lab Results  Component Value Date   WBC 4.3 01/26/2024   HGB 10.5 (L) 01/26/2024   HCT 33.4 (L) 01/26/2024   MCV 86.1 01/26/2024   PLT 265 01/26/2024   Lab Results  Component Value Date   FERRITIN 152 11/16/2023   IRON  36 11/16/2023   TIBC 249 (L) 11/16/2023   UIBC 213 11/16/2023   IRONPCTSAT 14 11/16/2023   Lab Results  Component Value Date   RETICCTPCT 0.8 01/26/2024   RBC 3.87 01/26/2024   No results found for: KPAFRELGTCHN, LAMBDASER, KAPLAMBRATIO No results found for: IGGSERUM, IGA, IGMSERUM No results found for: STEPHANY CARLOTA BENSON MARKEL EARLA JOANNIE DOC VICK, SPEI   Chemistry      Component Value Date/Time   NA 140 01/26/2024 1525   NA 136 (A) 09/08/2017 0000   K 4.3 01/26/2024 1525   CL 104 01/26/2024 1525   CO2 26 01/26/2024 1525   BUN 19 01/26/2024 1525   BUN 8 09/08/2017 0000   CREATININE 0.73 01/26/2024 1525   CREATININE 0.69 08/02/2016 1519   GLU 111 09/08/2017 0000      Component Value Date/Time   CALCIUM 9.2 01/26/2024 1525   ALKPHOS 79 01/26/2024 1525   AST 16 01/26/2024 1525   ALT 7 01/26/2024 1525   BILITOT 0.2 01/26/2024 1525       Impression and Plan: Shannon Scott is a very pleasant 83 yo caucasian female with recurrent undifferentiated endometrial carcinoma.  She has Lynch syndrome.  She was on immunotherapy.  She tolerated this incredibly well.  It worked incredibly well. This has been on hold per pt request.  We will set her up with a  CT scan.  Again, last CT scan was a couple years ago.  Her hemoglobin is certainly a lot better.  She will get a dose of Aranesp  today.  I told her that she can take oral vitamin B-12.  We probably have to get her back to see us  in another 6 weeks or so.  When I have to stay on top of this anemia and also of her weight.  I want to make sure that everything will be okay with the holiday season coming up.   Maude JONELLE Crease, MD 8/28/20254:23 PM

## 2024-01-26 NOTE — Patient Instructions (Addendum)
 Darbepoetin Alfa  Injection What is this medication? DARBEPOETIN ALFA  (dar be POE e tin AL fa) treats low levels of red blood cells (anemia) caused by kidney disease or chemotherapy. It works by Systems analyst make more red blood cells, which reduces the need for blood transfusions. This medicine may be used for other purposes; ask your health care provider or pharmacist if you have questions. COMMON BRAND NAME(S): Aranesp  What should I tell my care team before I take this medication? They need to know if you have any of these conditions: Blood clots Cancer Heart disease High blood pressure On dialysis Seizures Stroke An unusual or allergic reaction to darbepoetin, latex, other medications, foods, dyes, or preservatives Pregnant or trying to get pregnant Breast-feeding How should I use this medication? This medication is injected into a vein or under the skin. It is usually given by a care team in a hospital or clinic setting. It may also be given at home. If you get this medication at home, you will be taught how to prepare and give it. Use exactly as directed. Take it as directed on the prescription label at the same time every day. Keep taking it unless your care team tells you to stop. It is important that you put your used needles and syringes in a special sharps container. Do not put them in a trash can. If you do not have a sharps container, call your pharmacist or care team to get one. A special MedGuide will be given to you by the pharmacist with each prescription and refill. Be sure to read this information carefully each time. Talk to your care team about the use of this medication in children. While this medication may be used in children as young as 1 month of age for selected conditions, precautions do apply. Overdosage: If you think you have taken too much of this medicine contact a poison control center or emergency room at once. NOTE: This medicine is only for you. Do not  share this medicine with others. What if I miss a dose? If you miss a dose, take it as soon as you can. If it is almost time for your next dose, take only that dose. Do not take double or extra doses. What may interact with this medication? Epoetin alfa Methoxy polyethylene glycol-epoetin beta This list may not describe all possible interactions. Give your health care provider a list of all the medicines, herbs, non-prescription drugs, or dietary supplements you use. Also tell them if you smoke, drink alcohol, or use illegal drugs. Some items may interact with your medicine. What should I watch for while using this medication? Visit your care team for regular checks on your progress. Check your blood pressure as directed. Know what your blood pressure should be and when to contact your care team. Your condition will be monitored carefully while you are receiving this medication. You may need blood work while taking this medication. What side effects may I notice from receiving this medication? Side effects that you should report to your care team as soon as possible: Allergic reactions--skin rash, itching, hives, swelling of the face, lips, tongue, or throat Blood clot--pain, swelling, or warmth in the leg, shortness of breath, chest pain Heart attack--pain or tightness in the chest, shoulders, arms, or jaw, nausea, shortness of breath, cold or clammy skin, feeling faint or lightheaded Increase in blood pressure Rash, fever, and swollen lymph nodes Redness, blistering, peeling, or loosening of the skin, including inside the mouth Seizures  Stroke--sudden numbness or weakness of the face, arm, or leg, trouble speaking, confusion, trouble walking, loss of balance or coordination, dizziness, severe headache, change in vision Side effects that usually do not require medical attention (report to your care team if they continue or are bothersome): Cough Stomach pain Swelling of the ankles, hands, or  feet This list may not describe all possible side effects. Call your doctor for medical advice about side effects. You may report side effects to FDA at 1-800-FDA-1088. Where should I keep my medication? Keep out of the reach of children and pets. Store in a refrigerator. Do not freeze. Do not shake. Protect from light. Keep this medication in the original container until you are ready to take it. See product for storage information. Get rid of any unused medication after the expiration date. To get rid of medications that are no longer needed or have expired: Take the medication to a medication take-back program. Check with your pharmacy or law enforcement to find a location. If you cannot return the medication, ask your pharmacist or care team how to get rid of the medication safely. NOTE: This sheet is a summary. It may not cover all possible information. If you have questions about this medicine, talk to your doctor, pharmacist, or health care provider.  2024 Elsevier/Gold Standard (2021-09-16 00:00:00)Vitamin B12 Injection What is this medication? Vitamin B12 (VAHY tuh min B12) prevents and treats low vitamin B12 levels in your body. It is used in people who do not get enough vitamin B12 from their diet or when their digestive tract does not absorb enough. Vitamin B12 plays an important role in maintaining the health of your nervous system and red blood cells. This medicine may be used for other purposes; ask your health care provider or pharmacist if you have questions. COMMON BRAND NAME(S): B-12 Compliance Kit, B-12 Injection Kit, Cyomin, Dodex , LA-12, Nutri-Twelve, Physicians EZ Use B-12, Primabalt, Vitamin Deficiency Injectable System - B12 What should I tell my care team before I take this medication? They need to know if you have any of these conditions: Kidney disease Leber's disease Megaloblastic anemia An unusual or allergic reaction to cyanocobalamin , cobalt, other medications,  foods, dyes, or preservatives Pregnant or trying to get pregnant Breast-feeding How should I use this medication? This medication is injected into a muscle or deeply under the skin. It is usually given in a clinic or care team's office. However, your care team may teach you how to inject yourself. Follow all instructions. Talk to your care team about the use of this medication in children. Special care may be needed. Overdosage: If you think you have taken too much of this medicine contact a poison control center or emergency room at once. NOTE: This medicine is only for you. Do not share this medicine with others. What if I miss a dose? If you are given your dose at a clinic or care team's office, call to reschedule your appointment. If you give your own injections, and you miss a dose, take it as soon as you can. If it is almost time for your next dose, take only that dose. Do not take double or extra doses. What may interact with this medication? Alcohol Colchicine  This list may not describe all possible interactions. Give your health care provider a list of all the medicines, herbs, non-prescription drugs, or dietary supplements you use. Also tell them if you smoke, drink alcohol, or use illegal drugs. Some items may interact with your medicine. What  should I watch for while using this medication? Visit your care team regularly. You may need blood work done while you are taking this medication. You may need to follow a special diet. Talk to your care team. Limit your alcohol intake and avoid smoking to get the best benefit. What side effects may I notice from receiving this medication? Side effects that you should report to your care team as soon as possible: Allergic reactions--skin rash, itching, hives, swelling of the face, lips, tongue, or throat Swelling of the ankles, hands, or feet Trouble breathing Side effects that usually do not require medical attention (report to your care team  if they continue or are bothersome): Diarrhea This list may not describe all possible side effects. Call your doctor for medical advice about side effects. You may report side effects to FDA at 1-800-FDA-1088. Where should I keep my medication? Keep out of the reach of children. Store at room temperature between 15 and 30 degrees C (59 and 85 degrees F). Protect from light. Throw away any unused medication after the expiration date. NOTE: This sheet is a summary. It may not cover all possible information. If you have questions about this medicine, talk to your doctor, pharmacist, or health care provider.  2024 Elsevier/Gold Standard (2021-01-27 00:00:00)  Darbepoetin Alfa  Injection What is this medication? DARBEPOETIN ALFA  (dar be POE e tin AL fa) treats low levels of red blood cells (anemia) caused by kidney disease or chemotherapy. It works by Systems analyst make more red blood cells, which reduces the need for blood transfusions. This medicine may be used for other purposes; ask your health care provider or pharmacist if you have questions. COMMON BRAND NAME(S): Aranesp  What should I tell my care team before I take this medication? They need to know if you have any of these conditions: Blood clots Cancer Heart disease High blood pressure On dialysis Seizures Stroke An unusual or allergic reaction to darbepoetin, latex, other medications, foods, dyes, or preservatives Pregnant or trying to get pregnant Breast-feeding How should I use this medication? This medication is injected into a vein or under the skin. It is usually given by a care team in a hospital or clinic setting. It may also be given at home. If you get this medication at home, you will be taught how to prepare and give it. Use exactly as directed. Take it as directed on the prescription label at the same time every day. Keep taking it unless your care team tells you to stop. It is important that you put your used  needles and syringes in a special sharps container. Do not put them in a trash can. If you do not have a sharps container, call your pharmacist or care team to get one. A special MedGuide will be given to you by the pharmacist with each prescription and refill. Be sure to read this information carefully each time. Talk to your care team about the use of this medication in children. While this medication may be used in children as young as 1 month of age for selected conditions, precautions do apply. Overdosage: If you think you have taken too much of this medicine contact a poison control center or emergency room at once. NOTE: This medicine is only for you. Do not share this medicine with others. What if I miss a dose? If you miss a dose, take it as soon as you can. If it is almost time for your next dose, take only that dose.  Do not take double or extra doses. What may interact with this medication? Epoetin alfa Methoxy polyethylene glycol-epoetin beta This list may not describe all possible interactions. Give your health care provider a list of all the medicines, herbs, non-prescription drugs, or dietary supplements you use. Also tell them if you smoke, drink alcohol, or use illegal drugs. Some items may interact with your medicine. What should I watch for while using this medication? Visit your care team for regular checks on your progress. Check your blood pressure as directed. Know what your blood pressure should be and when to contact your care team. Your condition will be monitored carefully while you are receiving this medication. You may need blood work while taking this medication. What side effects may I notice from receiving this medication? Side effects that you should report to your care team as soon as possible: Allergic reactions--skin rash, itching, hives, swelling of the face, lips, tongue, or throat Blood clot--pain, swelling, or warmth in the leg, shortness of breath, chest  pain Heart attack--pain or tightness in the chest, shoulders, arms, or jaw, nausea, shortness of breath, cold or clammy skin, feeling faint or lightheaded Increase in blood pressure Rash, fever, and swollen lymph nodes Redness, blistering, peeling, or loosening of the skin, including inside the mouth Seizures Stroke--sudden numbness or weakness of the face, arm, or leg, trouble speaking, confusion, trouble walking, loss of balance or coordination, dizziness, severe headache, change in vision Side effects that usually do not require medical attention (report to your care team if they continue or are bothersome): Cough Stomach pain Swelling of the ankles, hands, or feet This list may not describe all possible side effects. Call your doctor for medical advice about side effects. You may report side effects to FDA at 1-800-FDA-1088. Where should I keep my medication? Keep out of the reach of children and pets. Store in a refrigerator. Do not freeze. Do not shake. Protect from light. Keep this medication in the original container until you are ready to take it. See product for storage information. Get rid of any unused medication after the expiration date. To get rid of medications that are no longer needed or have expired: Take the medication to a medication take-back program. Check with your pharmacy or law enforcement to find a location. If you cannot return the medication, ask your pharmacist or care team how to get rid of the medication safely. NOTE: This sheet is a summary. It may not cover all possible information. If you have questions about this medicine, talk to your doctor, pharmacist, or health care provider.  2024 Elsevier/Gold Standard (2021-09-16 00:00:00)

## 2024-01-27 ENCOUNTER — Telehealth: Payer: Self-pay | Admitting: *Deleted

## 2024-01-27 ENCOUNTER — Ambulatory Visit: Payer: Self-pay | Admitting: Hematology & Oncology

## 2024-01-27 NOTE — Telephone Encounter (Signed)
 Message received from patient wanting to know if she needs to come in for Vit B12  injection appt scheduled for 02/02/24 since Dr Timmy instructed her to start Vitamin B12 orally once a day.  Pt instructed that she does not need to come in for Vitamin B12 injection appt, but that Dr Timmy would like for her to come in for three doses of IV iron .  Pt states that she would like to discuss the iron  infusions with her son and call office back with her decision whether she would like the iron  infusions or not.  Dr. Timmy notified and message sent to scheduling.

## 2024-01-28 LAB — CA 125: Cancer Antigen (CA) 125: 14.8 U/mL (ref 0.0–38.1)

## 2024-01-31 ENCOUNTER — Other Ambulatory Visit: Payer: Self-pay | Admitting: Hematology & Oncology

## 2024-01-31 DIAGNOSIS — G8929 Other chronic pain: Secondary | ICD-10-CM

## 2024-01-31 DIAGNOSIS — M545 Low back pain, unspecified: Secondary | ICD-10-CM

## 2024-01-31 DIAGNOSIS — C541 Malignant neoplasm of endometrium: Secondary | ICD-10-CM

## 2024-01-31 DIAGNOSIS — M792 Neuralgia and neuritis, unspecified: Secondary | ICD-10-CM

## 2024-02-02 ENCOUNTER — Inpatient Hospital Stay

## 2024-02-09 ENCOUNTER — Telehealth: Payer: Self-pay | Admitting: Hematology & Oncology

## 2024-02-09 NOTE — Telephone Encounter (Signed)
 LVM for patient to return call on scheduling iron  infusions per inbasket.

## 2024-02-20 ENCOUNTER — Other Ambulatory Visit (HOSPITAL_COMMUNITY): Payer: Self-pay

## 2024-02-23 ENCOUNTER — Inpatient Hospital Stay: Attending: Hematology & Oncology

## 2024-02-23 VITALS — BP 132/45 | HR 70 | Temp 98.9°F | Resp 18

## 2024-02-23 DIAGNOSIS — D509 Iron deficiency anemia, unspecified: Secondary | ICD-10-CM | POA: Insufficient documentation

## 2024-02-23 DIAGNOSIS — D508 Other iron deficiency anemias: Secondary | ICD-10-CM

## 2024-02-23 DIAGNOSIS — Z79899 Other long term (current) drug therapy: Secondary | ICD-10-CM | POA: Diagnosis not present

## 2024-02-23 MED ORDER — IRON SUCROSE 300 MG IVPB - SIMPLE MED
300.0000 mg | Freq: Once | Status: AC
  Administered 2024-02-23: 300 mg via INTRAVENOUS
  Filled 2024-02-23: qty 300

## 2024-02-23 MED ORDER — SODIUM CHLORIDE 0.9 % IV SOLN: INTRAVENOUS | Status: DC

## 2024-02-29 ENCOUNTER — Telehealth: Payer: Self-pay | Admitting: Cardiology

## 2024-02-29 ENCOUNTER — Ambulatory Visit: Attending: Emergency Medicine | Admitting: Emergency Medicine

## 2024-02-29 ENCOUNTER — Encounter: Payer: Self-pay | Admitting: Emergency Medicine

## 2024-02-29 ENCOUNTER — Emergency Department (HOSPITAL_COMMUNITY)

## 2024-02-29 ENCOUNTER — Encounter (HOSPITAL_COMMUNITY): Payer: Self-pay | Admitting: Emergency Medicine

## 2024-02-29 ENCOUNTER — Other Ambulatory Visit: Payer: Self-pay

## 2024-02-29 ENCOUNTER — Telehealth: Payer: Self-pay | Admitting: *Deleted

## 2024-02-29 ENCOUNTER — Inpatient Hospital Stay (HOSPITAL_COMMUNITY)
Admission: EM | Admit: 2024-02-29 | Discharge: 2024-03-14 | DRG: 811 | Disposition: A | Discharge status: Home Health | Source: Ambulatory Visit | Attending: Internal Medicine | Admitting: Internal Medicine

## 2024-02-29 VITALS — BP 120/49 | HR 77 | Ht 61.0 in | Wt 132.2 lb

## 2024-02-29 DIAGNOSIS — Z86718 Personal history of other venous thrombosis and embolism: Secondary | ICD-10-CM

## 2024-02-29 DIAGNOSIS — D509 Iron deficiency anemia, unspecified: Secondary | ICD-10-CM | POA: Diagnosis present

## 2024-02-29 DIAGNOSIS — Z515 Encounter for palliative care: Secondary | ICD-10-CM

## 2024-02-29 DIAGNOSIS — I110 Hypertensive heart disease with heart failure: Secondary | ICD-10-CM | POA: Diagnosis not present

## 2024-02-29 DIAGNOSIS — I252 Old myocardial infarction: Secondary | ICD-10-CM

## 2024-02-29 DIAGNOSIS — Z8249 Family history of ischemic heart disease and other diseases of the circulatory system: Secondary | ICD-10-CM

## 2024-02-29 DIAGNOSIS — I5032 Chronic diastolic (congestive) heart failure: Secondary | ICD-10-CM | POA: Diagnosis not present

## 2024-02-29 DIAGNOSIS — R079 Chest pain, unspecified: Secondary | ICD-10-CM

## 2024-02-29 DIAGNOSIS — Z79899 Other long term (current) drug therapy: Secondary | ICD-10-CM

## 2024-02-29 DIAGNOSIS — I471 Supraventricular tachycardia, unspecified: Secondary | ICD-10-CM | POA: Diagnosis not present

## 2024-02-29 DIAGNOSIS — Z9221 Personal history of antineoplastic chemotherapy: Secondary | ICD-10-CM

## 2024-02-29 DIAGNOSIS — R195 Other fecal abnormalities: Secondary | ICD-10-CM | POA: Diagnosis present

## 2024-02-29 DIAGNOSIS — I25709 Atherosclerosis of coronary artery bypass graft(s), unspecified, with unspecified angina pectoris: Secondary | ICD-10-CM | POA: Diagnosis not present

## 2024-02-29 DIAGNOSIS — D62 Acute posthemorrhagic anemia: Principal | ICD-10-CM | POA: Diagnosis present

## 2024-02-29 DIAGNOSIS — D120 Benign neoplasm of cecum: Secondary | ICD-10-CM | POA: Diagnosis not present

## 2024-02-29 DIAGNOSIS — I2510 Atherosclerotic heart disease of native coronary artery without angina pectoris: Secondary | ICD-10-CM | POA: Diagnosis present

## 2024-02-29 DIAGNOSIS — I2489 Other forms of acute ischemic heart disease: Secondary | ICD-10-CM | POA: Diagnosis present

## 2024-02-29 DIAGNOSIS — Z7902 Long term (current) use of antithrombotics/antiplatelets: Secondary | ICD-10-CM

## 2024-02-29 DIAGNOSIS — I5033 Acute on chronic diastolic (congestive) heart failure: Secondary | ICD-10-CM | POA: Diagnosis not present

## 2024-02-29 DIAGNOSIS — Z951 Presence of aortocoronary bypass graft: Secondary | ICD-10-CM

## 2024-02-29 DIAGNOSIS — Z955 Presence of coronary angioplasty implant and graft: Secondary | ICD-10-CM

## 2024-02-29 DIAGNOSIS — M792 Neuralgia and neuritis, unspecified: Secondary | ICD-10-CM

## 2024-02-29 DIAGNOSIS — Z90710 Acquired absence of both cervix and uterus: Secondary | ICD-10-CM

## 2024-02-29 DIAGNOSIS — I480 Paroxysmal atrial fibrillation: Secondary | ICD-10-CM

## 2024-02-29 DIAGNOSIS — Z15060 Genetic susceptibility to colorectal cancer: Secondary | ICD-10-CM

## 2024-02-29 DIAGNOSIS — K922 Gastrointestinal hemorrhage, unspecified: Principal | ICD-10-CM | POA: Diagnosis present

## 2024-02-29 DIAGNOSIS — E785 Hyperlipidemia, unspecified: Secondary | ICD-10-CM

## 2024-02-29 DIAGNOSIS — I700 Atherosclerosis of aorta: Secondary | ICD-10-CM | POA: Diagnosis not present

## 2024-02-29 DIAGNOSIS — I10 Essential (primary) hypertension: Secondary | ICD-10-CM | POA: Diagnosis not present

## 2024-02-29 DIAGNOSIS — R54 Age-related physical debility: Secondary | ICD-10-CM | POA: Diagnosis present

## 2024-02-29 DIAGNOSIS — E876 Hypokalemia: Secondary | ICD-10-CM | POA: Diagnosis not present

## 2024-02-29 DIAGNOSIS — I350 Nonrheumatic aortic (valve) stenosis: Secondary | ICD-10-CM

## 2024-02-29 DIAGNOSIS — M545 Low back pain, unspecified: Secondary | ICD-10-CM

## 2024-02-29 DIAGNOSIS — D631 Anemia in chronic kidney disease: Secondary | ICD-10-CM | POA: Diagnosis present

## 2024-02-29 DIAGNOSIS — Z8673 Personal history of transient ischemic attack (TIA), and cerebral infarction without residual deficits: Secondary | ICD-10-CM

## 2024-02-29 DIAGNOSIS — K435 Parastomal hernia without obstruction or  gangrene: Secondary | ICD-10-CM | POA: Diagnosis present

## 2024-02-29 DIAGNOSIS — I25119 Atherosclerotic heart disease of native coronary artery with unspecified angina pectoris: Secondary | ICD-10-CM | POA: Diagnosis present

## 2024-02-29 DIAGNOSIS — I272 Pulmonary hypertension, unspecified: Secondary | ICD-10-CM | POA: Diagnosis not present

## 2024-02-29 DIAGNOSIS — G8929 Other chronic pain: Secondary | ICD-10-CM

## 2024-02-29 DIAGNOSIS — R7989 Other specified abnormal findings of blood chemistry: Secondary | ICD-10-CM | POA: Diagnosis present

## 2024-02-29 DIAGNOSIS — I517 Cardiomegaly: Secondary | ICD-10-CM | POA: Diagnosis not present

## 2024-02-29 DIAGNOSIS — E1165 Type 2 diabetes mellitus with hyperglycemia: Secondary | ICD-10-CM | POA: Diagnosis not present

## 2024-02-29 DIAGNOSIS — Z881 Allergy status to other antibiotic agents status: Secondary | ICD-10-CM | POA: Diagnosis not present

## 2024-02-29 DIAGNOSIS — E039 Hypothyroidism, unspecified: Secondary | ICD-10-CM | POA: Diagnosis not present

## 2024-02-29 DIAGNOSIS — Z7901 Long term (current) use of anticoagulants: Secondary | ICD-10-CM | POA: Diagnosis not present

## 2024-02-29 DIAGNOSIS — Z8542 Personal history of malignant neoplasm of other parts of uterus: Secondary | ICD-10-CM

## 2024-02-29 DIAGNOSIS — C541 Malignant neoplasm of endometrium: Secondary | ICD-10-CM

## 2024-02-29 DIAGNOSIS — Z79890 Hormone replacement therapy (postmenopausal): Secondary | ICD-10-CM

## 2024-02-29 DIAGNOSIS — Z923 Personal history of irradiation: Secondary | ICD-10-CM

## 2024-02-29 DIAGNOSIS — D649 Anemia, unspecified: Secondary | ICD-10-CM | POA: Diagnosis present

## 2024-02-29 DIAGNOSIS — K529 Noninfective gastroenteritis and colitis, unspecified: Secondary | ICD-10-CM | POA: Diagnosis present

## 2024-02-29 LAB — COMPREHENSIVE METABOLIC PANEL WITH GFR
ALT: 11 U/L (ref 0–44)
AST: 16 U/L (ref 15–41)
Albumin: 2.9 g/dL — ABNORMAL LOW (ref 3.5–5.0)
Alkaline Phosphatase: 52 U/L (ref 38–126)
Anion gap: 11 (ref 5–15)
BUN: 20 mg/dL (ref 8–23)
CO2: 25 mmol/L (ref 22–32)
Calcium: 8.5 mg/dL — ABNORMAL LOW (ref 8.9–10.3)
Chloride: 102 mmol/L (ref 98–111)
Creatinine, Ser: 0.8 mg/dL (ref 0.44–1.00)
GFR, Estimated: >60 mL/min (ref >60)
Glucose, Bld: 129 mg/dL — ABNORMAL HIGH (ref 70–99)
Potassium: 3.5 mmol/L (ref 3.5–5.1)
Sodium: 138 mmol/L (ref 135–145)
Total Bilirubin: 0.3 mg/dL (ref 0.0–1.2)
Total Protein: 5.4 g/dL — ABNORMAL LOW (ref 6.5–8.1)

## 2024-02-29 LAB — CBC
HCT: 22.9 % — ABNORMAL LOW (ref 36.0–46.0)
Hemoglobin: 6.8 g/dL — CL (ref 12.0–15.0)
MCH: 27.5 pg (ref 26.0–34.0)
MCHC: 29.7 g/dL — ABNORMAL LOW (ref 30.0–36.0)
MCV: 92.7 fL (ref 80.0–100.0)
Platelets: 335 K/uL (ref 150–400)
RBC: 2.47 MIL/uL — ABNORMAL LOW (ref 3.87–5.11)
RDW: 17.9 % — ABNORMAL HIGH (ref 11.5–15.5)
WBC: 6 K/uL (ref 4.0–10.5)
nRBC: 0 % (ref 0.0–0.2)

## 2024-02-29 LAB — PREPARE RBC (CROSSMATCH)

## 2024-02-29 MED ORDER — SODIUM CHLORIDE 0.9% IV SOLUTION: Freq: Once | INTRAVENOUS | Status: AC

## 2024-02-29 NOTE — ED Provider Notes (Incomplete)
 [facility] Provider Note   CSN: [id number] Arrival date & time: 02/29/24  2119     Patient presents with: abnormal labs   Shannon Scott is a 83 y.o. 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 Patients labs from 8/28 showing acute decrease in hgb from 10.5 to 6.7. patient endorsing generalized weakness x3 days. Patient also having some chest tightness over the past 4 days - sometimes with rest and sometimes with exertion. Patient had appointment with cardiology who scheduled her for a heart cath next week - patient was told to hold her blood thinners this Friday until the heart cath.   Patient stating that her ostomy bag has had dark/black output over the past 1 week. She thought that this was d/t her eating chocolate.   Denies fever, dyspnea, cough, nausea, vomiting, diarrhea, dysuria, hematuria.    {Add pertinent medical, surgical, social history, OB history to HPI:32947} HPI     Prior to Admission medications   Medication Sig Start Date End Date Taking? Authorizing Provider  apixaban  (ELIQUIS ) 5 MG TABS tablet Take 1 tablet (5 mg total) by mouth 2 (two) times daily. 08/30/23     clopidogrel  (PLAVIX ) 75 MG tablet Take 1 tablet (75 mg total) by mouth daily with breakfast. 06/14/23     Cyanocobalamin  (VITAMIN B-12) 5000 MCG LOZG Take 1 tablet by mouth daily.    [provider]  Evolocumab  (REPATHA  SURECLICK) 140 MG/ML SOAJ INJECT 1 DOSE UNDER THE SKIN EVERY 14 DAYS 01/11/24   Hilty, Vinie BROCKS, MD  ezetimibe  (ZETIA ) 10 MG tablet Take 10 mg by mouth daily.    [provider]  furosemide  (LASIX ) 20 MG tablet Take 1 tablet (20 mg total) by mouth every Monday, Wednesday, and Friday. 08/19/23   Hilty,  Vinie BROCKS, MD  levothyroxine  (SYNTHROID ) 50 MCG tablet Take 1 tablet (50 mcg total) by mouth daily. 06/08/23     mometasone  (ELOCON ) 0.1 % cream Apply 1 Application topically daily as needed (to the vulva for irritation and itching). 05/18/22   Cross, Melissa D, NP  Multiple Vitamins-Minerals (MULTI-VITAMIN GUMMIES PO) Take by mouth daily.    [provider]  mupirocin  ointment (BACTROBAN ) 2 % Apply 1 Application topically 2 (two) times daily. 06/16/23   Tonette Lauraine HERO, PA-C  nitroGLYCERIN  (NITROSTAT ) 0.4 MG SL tablet Place 1 tablet (0.4 mg total) under the tongue every 5 (five) minutes as needed for chest pain. 03/04/23   Cheryle Page, MD  polyethylene glycol (MIRALAX  / GLYCOLAX ) 17 g packet Take 17 g by mouth daily as needed for severe constipation. Patient taking differently: Take 17 g by mouth daily. 03/29/20   Amin, Ankit C, MD  TIADYLT  ER 300 MG 24 hr capsule Take 300 mg by mouth daily. 08/11/23   [provider]  traMADol  (ULTRAM ) 50 MG tablet TAKE 2 TABLETS(100 MG) BY MOUTH TWICE DAILY 01/31/24   Timmy Maude JONELLE, MD    Allergies: Statins and Ciprofloxacin     Review of Systems  Neurological:  Positive for weakness.    Updated Vital Signs BP (!) 129/56 (BP Location: Left Arm)   Pulse 79   Resp 16   Ht 5' 1 (1.549 m)   Wt 59 kg   SpO2 97%   BMI 24.56 kg/m  Physical Exam Vitals and nursing note reviewed.  Constitutional:      General: She is not in acute distress.    Appearance: She is not ill-appearing or toxic-appearing.  HENT:     Head: Normocephalic and atraumatic.     Mouth/Throat:     Mouth: Mucous membranes are moist.  Eyes:     General: No scleral icterus.       Right eye: No discharge.        Left eye: No discharge.     Conjunctiva/sclera: Conjunctivae normal.  Cardiovascular:     Rate and Rhythm: Normal rate and regular rhythm.     Pulses: Normal pulses.     Heart sounds: Normal heart sounds. No murmur heard. Pulmonary:     Effort:  Pulmonary effort is normal. No respiratory distress.     Breath sounds: Normal breath sounds. No wheezing, rhonchi or rales.  Abdominal:     General: Abdomen is flat. Bowel sounds are normal. There is no distension.     Palpations: Abdomen is soft. There is no mass.     Tenderness: There is no abdominal tenderness.     Comments: Ostomy in place with liquid stool concerning for melena  Skin:    General: Skin is warm and dry.     Findings: No rash.  Neurological:     General: No focal deficit present.     Mental Status: She is alert and oriented to person, place, and time. Mental status is at baseline.  Psychiatric:        Mood and Affect: Mood normal.        Behavior: Behavior normal.     (all labs ordered are listed, but only abnormal results are displayed) Labs Reviewed  COMPREHENSIVE METABOLIC PANEL WITH GFR - Abnormal; Notable for the following components:      Result Value   Glucose, Bld 129 (*)    Calcium 8.5 (*)    Total Protein 5.4 (*)    Albumin 2.9 (*)    All other components within normal limits  CBC  POC OCCULT BLOOD, ED  TYPE AND SCREEN    EKG: None  Radiology: No results found.  {Document cardiac monitor, telemetry assessment procedure when appropriate:32947} Procedures   Medications Ordered in the ED - No data to display    {Click here for ABCD2, HEART and other calculators REFRESH Note before signing:1}                              Medical Decision Making Amount and/or Complexity of Data Reviewed Labs: ordered. Radiology: ordered.   This patient presents to the ED for concern of weakness, this involves an extensive number of treatment options, and is a complaint that carries with it a high risk of complications and morbidity.  The differential diagnosis includes Ischemic stroke, intracerebral hemorrhage, subarachnoid hemorrhage, Guillain-Barr syndrome, hypoglycemia, electrolyte abnormality, sepsis, ACS, carbon monoxide poisoning, anemia,  dehydration.   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  Described as a pressure rather than a pain.  Her chest pressure occurs on exertion and improves with rest.  She has radiation of her pain  to her jaw.  No associated dyspnea. Discussed complex case with DOD Dr. Court and given her anginal symptoms plan will be to proceed with cardiac catheterization with Dr. Wonda who had performed her most recent cath.  Cardiac catheterization scheduled on 03/05/2024 with Dr. Wonda. She will hold her furosemide  and Eliquis  per guidelines. Continue clopidogrel  75 mg daily   Consultations Obtained:  I requested consultation with the ***,  and discussed lab and imaging findings as well as pertinent plan - they recommend: ***   Problem List / ED Course / Critical interventions / Medication management  *** I Ordered, and personally interpreted labs. *** The patient was maintained on a cardiac monitor.  I personally viewed and interpreted the EKG/cardiac monitored which showed an underlying rhythm of: *** I ordered imaging studies including *** . I independently visualized and interpreted imaging which showed *** . I agree with the radiologist interpretation I have reviewed the patients home medicines and have made adjustments as needed   DDX: these are considered less likely due to history of present illness and physical exam findings -Ischemic stroke/ICH/SAH: no neurodeficits -Hypoglycemia/electrolyte abnormality: CMP/BMP without concern -sepsis: no fever; vital signs stable -ACS: EKG sinus rhythm and no acute ST  changes -Guillain-Barr syndrome: no tingling/paresthesias in extremities, no neurodeficits -carbon monoxide poisoning: no nausea/vomiting; AMS; vision changes; dyspnea -anemia, dehydration: CBC/CMP/BMP without concern   Social Determinants of Health:  ***   {Document critical care time when appropriate  Document review of labs and clinical decision tools ie CHADS2VASC2, etc  Document your independent review of radiology images and any outside records  Document your discussion with family members, caretakers and with consultants  Document social determinants of health affecting pt's care  Document your decision making why or why not admission, treatments were needed:32947:::1}   Final diagnoses:  None    ED Discharge Orders     None

## 2024-02-29 NOTE — ED Triage Notes (Signed)
 Pt went to her provider today and had labs completed which resulted in a call advising to go to the ED for blood.  Pts HGB had decreased to 6.7 from 10.5 on the 8/28.  Pt denies any pain but reports feelings of weakness and feeling like I'm going to pass out.  Pt alert and oriented at this time.

## 2024-02-29 NOTE — Telephone Encounter (Signed)
 Received critical results page from [facility]. CBC showing Hgb 6.7 (down from 10.5 in 12/2023 though seems like baseline more in the 8-9's based on prior Hgbs). Other cell lines actually up so unlikely to be dilutional. Called patient to inform her of the labs, she noted some shortness of breath during her clinic visit, which we discussed may be related to her anemia. In talking about her symptoms, she noted that her stool looks different, thinks it looks black, but she thought it was due to the dark chocolate. I expressed that I was worried about her anemia and possible GI bleed and recommended that she come into the ED for further evaluation and treatment. She expressed understanding and said she would get ready to drive in tonight. Will tag her cardiology team as FYI.  Shannon LITTIE Farr, MD Cardiology 02/29/2024

## 2024-02-29 NOTE — Progress Notes (Signed)
 Cardiology Office Note:    Date:  02/29/2024  ID:  Alfonse JONELLE Gander, DOB [birth year], MRN [id number] PCP: Elliot Charm, MD  [facility] Providers Cardiologist:  Vinie JAYSON Maxcy, MD Cardiology APP:  Rana Lum CROME, NP       Patient Profile:       Chief Complaint: Acute visit for chest pains History of Present Illness:  Shannon Scott is a 83 y.o. female with visit-pertinent history of CAD s/p CABG x 3 with LIMA-LAD, SVG-OM, SVG-RCA in 2005 and s/p DES to SVG-OM 2 in 03/2023, severe aortic stenosis, locally advanced endometrial cancer s/p chemoradiation, immunotherapy, hysterectomy with BSO, colonic mass with partial colectomy and colostomy, prior DVT, PAF on eliquis , carotid artery stenosis, CVA, HTN, HLD, statin intolerance, lymphedema, asthma, hypothyroidism.    She s/p CABG x 3 in 2005.  Stress test in 2013 was negative for ischemia.  She was hospitalized in October 2021 in the setting of acute left MCA infarct.  Carotid artery ultrasound 05/2015 40-59% right ICA stenosis and 60-79% left ICA stenosis and greater than 50% left subclavian stenosis.   She was admitted 03/01/23 with NSTEMI. LHC performed and showed diffuse disease in the native vessels as well as both SVG grafts. Medical management was recommended and she was referred to the structural heart team for severe AS. She returned to the ED on 03/09/23 with inferior STEMI ultimately treated wtih PCI/DES-SVG-OM. Otherwise stable extensive disease including tandem lesions in the SVG-dRCA of 70% followed by 70%.  Echocardiogram showed EF 55 to 60%, RWMA, mild hypokinesis of the left ventricular, basal mid anterolateral wall, moderate mitral valve regurgitation, moderate aortic stenosis.  Unfortunately she developed radial PSA, this was medically managed by [facility]. PSA found to have spontaneously thrombosed, no intervention but with recommendations for DAPT with ASA/plavix  for a total of 2 weeks followed by Plavix  and  Eliquis  thereafter.    Seen in clinic on 03/25/2023.  She noted after discharge from the hospital she had become short of breath so she self discontinued carvedilol  at home and her shortness of breath spontaneously resolved.  In clinic her diltiazem  was increased from 180 mg to 300 mg in response to carvedilol  being discontinued.  She was doing well overall with no further acute complaints and referred to lipid clinic for an LDL of 111 on Repatha  and Zetia  however she did not follow-up.  She was last seen in clinic on 08/05/2023.  This is a routine follow-up and patient denied any new cardiovascular concerns.  She had been managing her lymphedema with pumps and gauze dressings.  She had been receiving iron  infusions which seem to have improved her energy levels.  She had recently had 2 teeth extracted which has helped her eat.  No medication changes were made she was referred to lipid clinic.  She was seen by Pharm.D. lipid clinic on 10/06/2023.  LDL was currently uncontrolled at 103.  Of note patient's baseline LDL was 358 in 2021.  The thought was unlikely that switching from Repatha  to Leqvio would have significant additional LDL lowering effect.  Therefore patient's ezetimibe  was transition to Nexlizet  180-10 mg daily.  She was continued on Repatha .    Discussed the use of AI scribe software for clinical note transcription with the patient, who gave verbal consent to proceed.  History of Present Illness Shannon Scott is an 83 year old female with coronary artery disease and severe aortic stenosis who presents for acute visit due to chest pressure. She is  accompanied by family who provides additional information about her symptoms.  She experiences chest pressure for the past four days, described as pressure rather than pain, located in the chest, and sometimes associated with palpitations. Episodes are often triggered by physical activity and subside after resting for a few minutes, lasting  approximately five to ten minutes.  She reports that she has radiation of her pain to her jaw.  She does report that her prior anginal equivalents were shortness of breath.  She does deny any shortness of breath today.  She denies any orthopnea, PND.  She does have chronic LEE due to lymphedema.  She denies any symptoms concerning for recurrent atrial fibrillation.  Also noted that she self discontinued Nexlizet  due to constipation and return to taking Zetia  10 mg daily.  Review of systems:  Please see the history of present illness. All other systems are reviewed and otherwise negative.      Studies Reviewed:    EKG Interpretation Date/Time:  Wednesday February 29 2024 15:06:49 EDT Ventricular Rate:  70 PR Interval:  202 QRS Duration:  84 QT Interval:  448 QTC Calculation: 483 R Axis:   20  Text Interpretation: Normal sinus rhythm Cannot rule out Inferior infarct , age undetermined When compared with ECG of 25-Mar-2023 14:11, Minimal criteria for Inferior infarct are now Present T wave inversion less evident in Inferior leads T wave inversion less evident in Lateral leads Confirmed by Rana Dixon [phone]) on 02/29/2024 5:09:35 PM    Echocardiogram 03/09/2023 1. Left ventricular ejection fraction, by estimation, is 55 to 60%. The  left ventricle has normal function. The left ventricle demonstrates  regional wall motion abnormalities (see scoring diagram/findings for  description). There is mild hypokinesis of  the left ventricular, basal-mid anterolateral wall.   2. The mitral valve is degenerative. Moderate mitral valve regurgitation.  No evidence of mitral stenosis. Severe mitral annular calcification.   3. Tricuspid valve regurgitation is mild to moderate.   4. The aortic valve is calcified. There is moderate calcification of the  aortic valve. There is moderate thickening of the aortic valve. Aortic  valve regurgitation is trivial. Moderate aortic valve stenosis.   5. There is  mildly elevated pulmonary artery systolic pressure.   6. The inferior vena cava is normal in size with greater than 50%  respiratory variability, suggesting right atrial pressure of 3 mmHg.    Cardiac catheterization 03/09/2023 1.  Known severe native vessel coronary artery disease, native coronary arteries not selectively imaged this procedure 2.  Known patency of the LIMA to LAD, this graft not selectively imaged this procedure 3.  Severe diffuse SVG to RCA disease, medical therapy recommended 4.  Acute total occlusion of a severely diseased saphenous vein graft to OM, treated successfully with primary PCI using a 3.0 x 12 mm Synergy DES Diagnostic Dominance: Right  Intervention    Echocardiogram 03/03/2023 1. Left ventricular ejection fraction, by estimation, is 55 to 60%. The  left ventricle has normal function. The left ventricle has no regional  wall motion abnormalities. There is mild concentric left ventricular  hypertrophy. Left ventricular diastolic  parameters are consistent with Grade II diastolic dysfunction  (pseudonormalization).   2. Right ventricular systolic function is low normal. The right  ventricular size is normal. There is normal pulmonary artery systolic  pressure. The estimated right ventricular systolic pressure is 24.7 mmHg.   3. Left atrial size was mildly dilated.   4. Right atrial size was mildly dilated.   5.  The mitral valve is degenerative. Mild to moderate mitral valve  regurgitation. No evidence of mitral stenosis. Moderate to severe mitral  annular calcification.   6. Tricuspid valve regurgitation is moderate.   7. The aortic valve is tricuspid. There is moderate calcification of the  aortic valve. Aortic valve regurgitation is trivial. Suspect severe  paradoxical low flow/low gradient aortic valve stenosis. Aortic valve  area, by VTI measures 0.79 cm. Aortic  valve mean gradient measures 24.0 mmHg.   8. The inferior vena cava is normal in size  with greater than 50%  respiratory variability, suggesting right atrial pressure of 3 mmHg.   Risk Assessment/Calculations:    CHA2DS2-VASc Score = 8   This indicates a 10.8% annual risk of stroke. The patient's score is based upon: CHF History: 1 HTN History: 1 Diabetes History: 0 Stroke History: 2 Vascular Disease History: 1 Age Score: 2 Gender Score: 1             Physical Exam:   VS:  BP (!) 120/49   Pulse 77   Ht 5' 1 (1.549 m)   Wt 132 lb 3.2 oz (60 kg)   SpO2 99%   BMI 24.98 kg/m    Wt Readings from Last 3 Encounters:  02/29/24 132 lb 3.2 oz (60 kg)  12/29/23 132 lb (59.9 kg)  11/16/23 129 lb (58.5 kg)    GEN: Well nourished, well developed in no acute distress NECK: No JVD; No carotid bruits CARDIAC: RRR.  4/6 systolic murmur.  RESPIRATORY:  Clear to auscultation without rales, wheezing or rhonchi  ABDOMEN: Soft, non-tender, distended abdomen EXTREMITIES: Lymphedema to bilateral lower extremity, no acute deformity      Assessment and Plan:  CAD s/p CABGx 3 in 2005 (LIMA-LAD, SVG-OM, SVG-RCA)  H/o inferior STEMI in 03/2023 w/ PCI/DES to SVG-OM2.  Patent LIMA-LAD and severe diffuse SVG-dRCA disease including tandem lesions of 70% followed by a 70%, where medical therapy was recommended. - Today patient presents acutely for chest pressure over the past 4 days.  Described as a pressure rather than a pain.  Her chest pressure occurs on exertion and improves with rest.  She has radiation of her pain to her jaw.  No associated dyspnea - Discussed complex case with DOD Dr. Court and given her anginal symptoms plan will be to proceed with cardiac catheterization with Dr. Wonda who had performed her most recent cath - Cardiac catheterization scheduled on 03/05/2024 with Dr. Wonda - She will hold her furosemide  and Eliquis  per guidelines - Continue clopidogrel  75 mg daily - Continue Repatha  140 mg q14 days and ezetimibe  10 mg daily - CBC and BMET today    Hyperlipidemia with LDL goal < 55 Direct LDL 103 on 07/2023 History of statin intolerance Seen by Pharm.D. on 10/06/2023.  Noted unlikely that switching from Repatha  to Leqvio would not have significant additional LDL lowering effect.  Furthermore she was switched from ezetimibe  to Nexlizet  and continued on Repatha  - She tells me today she experienced severe constipation on Nexlizet  and self discontinued and restarted back on ezetimibe  - Continue Repatha  140 mg q14 days and ezetimibe  10mg  daily  - During follow-up will consider referral back to Pharm.D. - Direct LDL today    Chronic HFpEF Echocardiogram 03/2023 with preserved EF 55-60% with mild hypokinesis of left ventricular basal-mid anterolateral wall - Hx of LE lymphedema otherwise pt appears euvolemic and well compensated - NYHA class II  - No dyspnea, orthopnea, PND  - Continue Lasix  20mg  on  MWF   Severe aortic stenosis Per Dr. Genice note on 03/11/2023: (Per Dr. Wonda - as part of Structural Heart Team: Pt did well with PCI of the SVG-OM today after presenting with STEMI. We had discussed her case yesterday in our valve team meeting. I told her family that she will not be a candidate for TAVR with her MI and high risk of recurrent MI as well as her other comorbidities.) For now we will just simply continue to monitor and treat medically - Patient describes chest pressure as noted above.  Denies any dyspnea or syncope   - Continue Lasix  20mg  MWF    Paroxysmal artrial fibrillation  Today she is maintaining NSR History of SOB on beta-blocking therapy  - She denies any symptoms concerning for recurrent AFib - Continue Cardizem  300 mg daily - Continue Eliquis  5mg  BID, no bleeding concerns   Endometrial Cancer - Managed by oncology    Informed Consent   Shared Decision Making/Informed Consent The risks [stroke (1 in 1000), death (1 in 1000), kidney failure [usually temporary] (1 in 500), bleeding (1 in 200), allergic  reaction [possibly serious] (1 in 200)], benefits (diagnostic support and management of coronary artery disease) and alternatives of a cardiac catheterization were discussed in detail with Ms. Jakie and she is willing to proceed.     Dispo:  Return in about 2 weeks (around 03/14/2024).  Signed, Lum LITTIE Louis, NP

## 2024-02-29 NOTE — Telephone Encounter (Signed)
 Spoke to patient to let her know that per provider she needs to start back taking her Lasix  on Monday, Wednesday, and Friday. She understood and will start back taking Lasix  today.

## 2024-02-29 NOTE — Patient Instructions (Signed)
 Medication Instructions:  NO CHANGES.  Lab Work: DIRECT LDL, CBC, AND BMET TO BE DONE TODAY.  Testing/Procedures:  [facility] A DEPT OF [place] [address] A DEPT OF THE [place] [address] [place] [address] Dept: [phone] Loc: [phone]  Shannon Scott  02/29/2024  You are scheduled for a Cardiac Catheterization on Monday, October 6 with Dr. Ozell Fell.  1. Please arrive at the [facility] (Main Entrance A) at [facility]: [address] [place], [place] [address] at 8:30 AM (This time is 2 hour(s) before your procedure to ensure your preparation).   Free valet parking service is available. You will check in at ADMITTING. The support person will be asked to wait in the waiting room.  It is OK to have someone drop you off and come back when you are ready to be discharged.    Special note: Every effort is made to have your procedure done on time. Please understand that emergencies sometimes delay scheduled procedures.  2. Diet: Nothing to eat after midnight.   3. Hydration: Nothing to eat and drink after midnight.   4. Labs: You will need to have blood drawn on Wednesday, October 1 at [facility] (1st Floor), [address] [place], [place] [address]. You do not need to be fasting.  5. Medication instructions in preparation for your procedure:   Contrast Allergy: No  Stop taking Eliquis  (Apixiban) on Saturday, October 4,2025.  Stop taking, Lasix  (Furosemide )  Sunday, March 04, 2024.  On the morning of your procedure, take your Plavix /Clopidogrel  and any morning medicines NOT listed above.  You may use sips of water .  6. Plan to go home the same day, you will only stay overnight if medically necessary. 7. Bring a current list of your medications and current insurance cards. 8. You MUST have a responsible person to drive you home. 9.  Someone MUST be with you the first 24 hours after you arrive home or your discharge will be delayed. 10. Please wear clothes that are easy to get on and off and wear slip-on shoes.  Thank you for allowing us  to care for you!   -- [facility]   Follow-Up: At [facility], you and your health needs are our priority.  As part of our continuing mission to provide you with exceptional heart care, our providers are all part of one team.  This team includes your primary Cardiologist (physician) and Advanced Practice Providers or APPs (Physician Assistants and Nurse Practitioners) who all work together to provide you with the care you need, when you need it.  Your next appointment:   2 WEEKS POST CATH  Provider:   MADISON FOUNTAIN, NP

## 2024-02-29 NOTE — ED Provider Notes (Signed)
 [facility] Provider Note   CSN: [id number] Arrival date & time: 02/29/24  2119     Patient presents with: abnormal labs   Shannon Scott is a 83 y.o. female with PMHx CVA, afib on Eliquis , OA, asthma, CAD, HLD, HTN, hypothyroidism, IDA on iron  infusions, erythropoietin  deficient anemia on Aranesp , GERD, locally advanced/recurrent endometrial carcinoma who presents to ED concerned for low hgb. Patients labs from 8/28 showing acute decrease in hgb from 10.5 to 6.7. patient endorsing generalized weakness and feeling pre-syncopal x3 days. Patient also having some chest tightness over the past 4 days - sometimes with rest and sometimes with exertion. Patient had appointment with cardiology who scheduled her for a heart cath next week - patient was told to hold her blood thinners this Friday until the heart cath.   Patient stating that her ostomy bag has had dark/black output over the past 1 week. She thought that this was d/t her eating chocolate.   Denies fever, dyspnea, cough, nausea, vomiting, diarrhea, dysuria, hematuria.    {Add pertinent medical, surgical, social history, OB history to HPI:32947} HPI     Prior to Admission medications   Medication Sig Start Date End Date Taking? Authorizing Provider  apixaban  (ELIQUIS ) 5 MG TABS tablet Take 1 tablet (5 mg total) by mouth 2 (two) times daily. 08/30/23     clopidogrel  (PLAVIX ) 75 MG tablet Take 1 tablet (75 mg total) by mouth daily with breakfast. 06/14/23     Cyanocobalamin  (VITAMIN B-12) 5000 MCG LOZG Take 1 tablet by mouth daily.    [provider]  Evolocumab  (REPATHA  SURECLICK) 140 MG/ML SOAJ INJECT 1 DOSE UNDER THE SKIN EVERY 14 DAYS 01/11/24   Hilty, Vinie BROCKS, MD  ezetimibe  (ZETIA ) 10 MG tablet Take 10 mg by mouth daily.    [provider]  furosemide  (LASIX ) 20 MG tablet Take 1 tablet (20 mg total) by mouth every Monday, Wednesday, and Friday. 08/19/23   Hilty,  Vinie BROCKS, MD  levothyroxine  (SYNTHROID ) 50 MCG tablet Take 1 tablet (50 mcg total) by mouth daily. 06/08/23     mometasone  (ELOCON ) 0.1 % cream Apply 1 Application topically daily as needed (to the vulva for irritation and itching). 05/18/22   Cross, Melissa D, NP  Multiple Vitamins-Minerals (MULTI-VITAMIN GUMMIES PO) Take by mouth daily.    [provider]  mupirocin  ointment (BACTROBAN ) 2 % Apply 1 Application topically 2 (two) times daily. 06/16/23   Tonette Lauraine HERO, PA-C  nitroGLYCERIN  (NITROSTAT ) 0.4 MG SL tablet Place 1 tablet (0.4 mg total) under the tongue every 5 (five) minutes as needed for chest pain. 03/04/23   Cheryle Page, MD  polyethylene glycol (MIRALAX  / GLYCOLAX ) 17 g packet Take 17 g by mouth daily as needed for severe constipation. Patient taking differently: Take 17 g by mouth daily. 03/29/20   Amin, Ankit C, MD  TIADYLT  ER 300 MG 24 hr capsule Take 300 mg by mouth daily. 08/11/23   [provider]  traMADol  (ULTRAM ) 50 MG tablet TAKE 2 TABLETS(100 MG) BY MOUTH TWICE DAILY 01/31/24   Timmy Maude JONELLE, MD    Allergies: Statins and Ciprofloxacin     Review of Systems  Updated Vital Signs BP (!) 129/56 (BP Location: Left Arm)   Pulse 79   Resp 16   Ht 5' 1 (1.549 m)   Wt 59 kg   SpO2 97%   BMI 24.56 kg/m   Physical Exam  (all labs ordered are listed, but  only abnormal results are displayed) Labs Reviewed  COMPREHENSIVE METABOLIC PANEL WITH GFR - Abnormal; Notable for the following components:      Result Value   Glucose, Bld 129 (*)    Calcium 8.5 (*)    Total Protein 5.4 (*)    Albumin 2.9 (*)    All other components within normal limits  CBC  POC OCCULT BLOOD, ED  TYPE AND SCREEN    EKG: None  Radiology: No results found.  {Document cardiac monitor, telemetry assessment procedure when appropriate:32947} Procedures   Medications Ordered in the ED - No data to display    {Click here for ABCD2, HEART and other calculators REFRESH  Note before signing:1}                              Medical Decision Making Amount and/or Complexity of Data Reviewed Labs: ordered.   ***  {Document critical care time when appropriate  Document review of labs and clinical decision tools ie CHADS2VASC2, etc  Document your independent review of radiology images and any outside records  Document your discussion with family members, caretakers and with consultants  Document social determinants of health affecting pt's care  Document your decision making why or why not admission, treatments were needed:32947:::1}   Final diagnoses:  None    ED Discharge Orders     None

## 2024-03-01 ENCOUNTER — Ambulatory Visit: Payer: Self-pay | Admitting: Emergency Medicine

## 2024-03-01 ENCOUNTER — Inpatient Hospital Stay

## 2024-03-01 ENCOUNTER — Inpatient Hospital Stay (HOSPITAL_COMMUNITY)

## 2024-03-01 ENCOUNTER — Encounter (HOSPITAL_COMMUNITY): Payer: Self-pay | Admitting: Family Medicine

## 2024-03-01 DIAGNOSIS — D62 Acute posthemorrhagic anemia: Secondary | ICD-10-CM | POA: Diagnosis not present

## 2024-03-01 DIAGNOSIS — K922 Gastrointestinal hemorrhage, unspecified: Secondary | ICD-10-CM | POA: Diagnosis present

## 2024-03-01 DIAGNOSIS — Z881 Allergy status to other antibiotic agents status: Secondary | ICD-10-CM | POA: Diagnosis not present

## 2024-03-01 DIAGNOSIS — Z7189 Other specified counseling: Secondary | ICD-10-CM | POA: Diagnosis not present

## 2024-03-01 DIAGNOSIS — K921 Melena: Secondary | ICD-10-CM | POA: Diagnosis not present

## 2024-03-01 DIAGNOSIS — I25119 Atherosclerotic heart disease of native coronary artery with unspecified angina pectoris: Secondary | ICD-10-CM

## 2024-03-01 DIAGNOSIS — D120 Benign neoplasm of cecum: Secondary | ICD-10-CM | POA: Diagnosis present

## 2024-03-01 DIAGNOSIS — R7989 Other specified abnormal findings of blood chemistry: Secondary | ICD-10-CM | POA: Diagnosis not present

## 2024-03-01 DIAGNOSIS — I509 Heart failure, unspecified: Secondary | ICD-10-CM | POA: Diagnosis not present

## 2024-03-01 DIAGNOSIS — C541 Malignant neoplasm of endometrium: Secondary | ICD-10-CM

## 2024-03-01 DIAGNOSIS — Z7901 Long term (current) use of anticoagulants: Secondary | ICD-10-CM | POA: Diagnosis not present

## 2024-03-01 DIAGNOSIS — I2510 Atherosclerotic heart disease of native coronary artery without angina pectoris: Secondary | ICD-10-CM | POA: Diagnosis not present

## 2024-03-01 DIAGNOSIS — I10 Essential (primary) hypertension: Secondary | ICD-10-CM | POA: Diagnosis not present

## 2024-03-01 DIAGNOSIS — R0602 Shortness of breath: Secondary | ICD-10-CM | POA: Diagnosis not present

## 2024-03-01 DIAGNOSIS — E039 Hypothyroidism, unspecified: Secondary | ICD-10-CM

## 2024-03-01 DIAGNOSIS — K5904 Chronic idiopathic constipation: Secondary | ICD-10-CM | POA: Diagnosis not present

## 2024-03-01 DIAGNOSIS — K529 Noninfective gastroenteritis and colitis, unspecified: Secondary | ICD-10-CM | POA: Diagnosis not present

## 2024-03-01 DIAGNOSIS — J811 Chronic pulmonary edema: Secondary | ICD-10-CM | POA: Diagnosis not present

## 2024-03-01 DIAGNOSIS — Z8249 Family history of ischemic heart disease and other diseases of the circulatory system: Secondary | ICD-10-CM | POA: Diagnosis not present

## 2024-03-01 DIAGNOSIS — R918 Other nonspecific abnormal finding of lung field: Secondary | ICD-10-CM | POA: Diagnosis not present

## 2024-03-01 DIAGNOSIS — I214 Non-ST elevation (NSTEMI) myocardial infarction: Secondary | ICD-10-CM | POA: Diagnosis not present

## 2024-03-01 DIAGNOSIS — I2489 Other forms of acute ischemic heart disease: Secondary | ICD-10-CM | POA: Diagnosis not present

## 2024-03-01 DIAGNOSIS — I5032 Chronic diastolic (congestive) heart failure: Secondary | ICD-10-CM | POA: Diagnosis not present

## 2024-03-01 DIAGNOSIS — R9431 Abnormal electrocardiogram [ECG] [EKG]: Secondary | ICD-10-CM

## 2024-03-01 DIAGNOSIS — E785 Hyperlipidemia, unspecified: Secondary | ICD-10-CM | POA: Diagnosis present

## 2024-03-01 DIAGNOSIS — I110 Hypertensive heart disease with heart failure: Secondary | ICD-10-CM | POA: Diagnosis not present

## 2024-03-01 DIAGNOSIS — Z515 Encounter for palliative care: Secondary | ICD-10-CM | POA: Diagnosis not present

## 2024-03-01 DIAGNOSIS — E1165 Type 2 diabetes mellitus with hyperglycemia: Secondary | ICD-10-CM | POA: Diagnosis not present

## 2024-03-01 DIAGNOSIS — D649 Anemia, unspecified: Secondary | ICD-10-CM | POA: Diagnosis not present

## 2024-03-01 DIAGNOSIS — I272 Pulmonary hypertension, unspecified: Secondary | ICD-10-CM | POA: Diagnosis not present

## 2024-03-01 DIAGNOSIS — I480 Paroxysmal atrial fibrillation: Secondary | ICD-10-CM | POA: Diagnosis not present

## 2024-03-01 DIAGNOSIS — Z951 Presence of aortocoronary bypass graft: Secondary | ICD-10-CM | POA: Diagnosis not present

## 2024-03-01 DIAGNOSIS — E876 Hypokalemia: Secondary | ICD-10-CM | POA: Diagnosis present

## 2024-03-01 DIAGNOSIS — Z9221 Personal history of antineoplastic chemotherapy: Secondary | ICD-10-CM | POA: Diagnosis not present

## 2024-03-01 DIAGNOSIS — R195 Other fecal abnormalities: Secondary | ICD-10-CM | POA: Diagnosis not present

## 2024-03-01 DIAGNOSIS — R0989 Other specified symptoms and signs involving the circulatory and respiratory systems: Secondary | ICD-10-CM | POA: Diagnosis not present

## 2024-03-01 DIAGNOSIS — K920 Hematemesis: Secondary | ICD-10-CM | POA: Diagnosis not present

## 2024-03-01 DIAGNOSIS — Z79899 Other long term (current) drug therapy: Secondary | ICD-10-CM | POA: Diagnosis not present

## 2024-03-01 DIAGNOSIS — J90 Pleural effusion, not elsewhere classified: Secondary | ICD-10-CM | POA: Diagnosis not present

## 2024-03-01 DIAGNOSIS — I350 Nonrheumatic aortic (valve) stenosis: Secondary | ICD-10-CM | POA: Diagnosis not present

## 2024-03-01 DIAGNOSIS — D631 Anemia in chronic kidney disease: Secondary | ICD-10-CM | POA: Diagnosis not present

## 2024-03-01 DIAGNOSIS — I5033 Acute on chronic diastolic (congestive) heart failure: Secondary | ICD-10-CM | POA: Diagnosis not present

## 2024-03-01 DIAGNOSIS — K449 Diaphragmatic hernia without obstruction or gangrene: Secondary | ICD-10-CM | POA: Diagnosis not present

## 2024-03-01 DIAGNOSIS — Z79890 Hormone replacement therapy (postmenopausal): Secondary | ICD-10-CM | POA: Diagnosis not present

## 2024-03-01 DIAGNOSIS — D509 Iron deficiency anemia, unspecified: Secondary | ICD-10-CM | POA: Diagnosis not present

## 2024-03-01 DIAGNOSIS — I471 Supraventricular tachycardia, unspecified: Secondary | ICD-10-CM | POA: Diagnosis not present

## 2024-03-01 LAB — BASIC METABOLIC PANEL WITH GFR
Anion gap: 7 (ref 5–15)
BUN: 18 mg/dL (ref 8–23)
CO2: 25 mmol/L (ref 22–32)
Calcium: 8.2 mg/dL — ABNORMAL LOW (ref 8.9–10.3)
Chloride: 106 mmol/L (ref 98–111)
Creatinine, Ser: 0.82 mg/dL (ref 0.44–1.00)
GFR, Estimated: >60 mL/min (ref >60)
Glucose, Bld: 94 mg/dL (ref 70–99)
Potassium: 3.5 mmol/L (ref 3.5–5.1)
Sodium: 138 mmol/L (ref 135–145)

## 2024-03-01 LAB — APTT: aPTT: 39 s — ABNORMAL HIGH (ref 24–36)

## 2024-03-01 LAB — DIFFERENTIAL
Abs Immature Granulocytes: 0.02 K/uL (ref 0.00–0.07)
Basophils Absolute: 0 K/uL (ref 0.0–0.1)
Basophils Relative: 1 %
Eosinophils Absolute: 0.1 K/uL (ref 0.0–0.5)
Eosinophils Relative: 1 %
Immature Granulocytes: 0 %
Lymphocytes Relative: 17 %
Lymphs Abs: 1 K/uL (ref 0.7–4.0)
Monocytes Absolute: 0.5 K/uL (ref 0.1–1.0)
Monocytes Relative: 8 %
Neutro Abs: 4.3 K/uL (ref 1.7–7.7)
Neutrophils Relative %: 73 %

## 2024-03-01 LAB — CBC
HCT: 23.3 % — ABNORMAL LOW (ref 36.0–46.0)
Hemoglobin: 7.5 g/dL — ABNORMAL LOW (ref 12.0–15.0)
MCH: 27.9 pg (ref 26.0–34.0)
MCHC: 32.2 g/dL (ref 30.0–36.0)
MCV: 86.6 fL (ref 80.0–100.0)
Platelets: 269 K/uL (ref 150–400)
RBC: 2.69 MIL/uL — ABNORMAL LOW (ref 3.87–5.11)
RDW: 17.1 % — ABNORMAL HIGH (ref 11.5–15.5)
WBC: 6.6 K/uL (ref 4.0–10.5)
nRBC: 0 % (ref 0.0–0.2)

## 2024-03-01 LAB — IRON AND TIBC
Iron: 29 ug/dL (ref 28–170)
Saturation Ratios: 11 % (ref 10.4–31.8)
TIBC: 255 ug/dL (ref 250–450)
UIBC: 226 ug/dL

## 2024-03-01 LAB — ECHOCARDIOGRAM COMPLETE
AR max vel: 0.99 cm2
AV Area VTI: 1.02 cm2
AV Area mean vel: 0.98 cm2
AV Mean grad: 30 mmHg
AV Peak grad: 55.1 mmHg
Ao pk vel: 3.71 m/s
Calc EF: 52.4 %
Height: 61 in
S' Lateral: 3.3 cm
Single Plane A2C EF: 54.6 %
Single Plane A4C EF: 50.9 %
Weight: 2010.6 [oz_av]

## 2024-03-01 LAB — HEMOGLOBIN AND HEMATOCRIT, BLOOD
HCT: 30.3 % — ABNORMAL LOW (ref 36.0–46.0)
Hemoglobin: 9.8 g/dL — ABNORMAL LOW (ref 12.0–15.0)

## 2024-03-01 LAB — LDL CHOLESTEROL, DIRECT

## 2024-03-01 LAB — RETICULOCYTES
Immature Retic Fract: 30 % — ABNORMAL HIGH (ref 2.3–15.9)
RBC.: 2.8 MIL/uL — ABNORMAL LOW (ref 3.87–5.11)
Retic Count, Absolute: 77.6 K/uL (ref 19.0–186.0)
Retic Ct Pct: 2.8 % (ref 0.4–3.1)

## 2024-03-01 LAB — PREPARE RBC (CROSSMATCH)

## 2024-03-01 LAB — PROTIME-INR
INR: 1.8 — ABNORMAL HIGH (ref 0.8–1.2)
Prothrombin Time: 21.4 s — ABNORMAL HIGH (ref 11.4–15.2)

## 2024-03-01 LAB — MAGNESIUM: Magnesium: 1.6 mg/dL — ABNORMAL LOW (ref 1.7–2.4)

## 2024-03-01 LAB — BRAIN NATRIURETIC PEPTIDE: B Natriuretic Peptide: 653.3 pg/mL — ABNORMAL HIGH (ref 0.0–100.0)

## 2024-03-01 LAB — POC OCCULT BLOOD, ED: Fecal Occult Blood, POC: POSITIVE — AB

## 2024-03-01 LAB — TROPONIN I (HIGH SENSITIVITY)
Troponin I (High Sensitivity): 104 ng/L (ref <18)
Troponin I (High Sensitivity): 91 ng/L — ABNORMAL HIGH (ref <18)
Troponin I (High Sensitivity): 142 ng/L (ref <18)
Troponin I (High Sensitivity): 308 ng/L (ref <18)

## 2024-03-01 LAB — FERRITIN: Ferritin: 114 ng/mL (ref 11–307)

## 2024-03-01 MED ORDER — DILTIAZEM HCL ER COATED BEADS 300 MG PO CP24
300.0000 mg | ORAL_CAPSULE | Freq: Every day | ORAL | Status: DC
Start: 2024-03-01 — End: 2024-03-01
  Filled 2024-03-01: qty 1

## 2024-03-01 MED ORDER — POTASSIUM CHLORIDE CRYS ER 20 MEQ PO TBCR: 40.0000 meq | EXTENDED_RELEASE_TABLET | Freq: Once | ORAL | Status: DC

## 2024-03-01 MED ORDER — PEG 3350-KCL-NA BICARB-NACL 420 G PO SOLR
4000.0000 mL | Freq: Once | ORAL | Status: AC
  Administered 2024-03-01: 4000 mL via ORAL
  Filled 2024-03-01: qty 4000

## 2024-03-01 MED ORDER — ACETAMINOPHEN 325 MG PO TABS
650.0000 mg | ORAL_TABLET | Freq: Four times a day (QID) | ORAL | Status: DC | PRN
  Administered 2024-03-03 – 2024-03-07 (×8): 650 mg via ORAL
  Filled 2024-03-01 (×8): qty 2

## 2024-03-01 MED ORDER — FUROSEMIDE 40 MG PO TABS
40.0000 mg | ORAL_TABLET | Freq: Once | ORAL | Status: AC
  Administered 2024-03-01: 40 mg via ORAL
  Filled 2024-03-01: qty 1

## 2024-03-01 MED ORDER — SODIUM CHLORIDE 0.9% IV SOLUTION
Freq: Once | INTRAVENOUS | Status: AC
Start: 2024-03-01 — End: 2024-03-01

## 2024-03-01 MED ORDER — SODIUM CHLORIDE 0.9% FLUSH
3.0000 mL | Freq: Two times a day (BID) | INTRAVENOUS | Status: DC
  Administered 2024-03-01 – 2024-03-14 (×26): 3 mL via INTRAVENOUS

## 2024-03-01 MED ORDER — SODIUM CHLORIDE 0.9 % IV SOLN: INTRAVENOUS | Status: AC

## 2024-03-01 MED ORDER — LEVOTHYROXINE SODIUM 50 MCG PO TABS
50.0000 ug | ORAL_TABLET | Freq: Every day | ORAL | Status: DC
  Administered 2024-03-01 – 2024-03-14 (×13): 50 ug via ORAL
  Filled 2024-03-01 (×10): qty 1
  Filled 2024-03-01: qty 2
  Filled 2024-03-01 (×3): qty 1

## 2024-03-01 MED ORDER — TRAMADOL HCL 50 MG PO TABS
50.0000 mg | ORAL_TABLET | Freq: Two times a day (BID) | ORAL | Status: DC | PRN
  Administered 2024-03-01 – 2024-03-10 (×17): 50 mg via ORAL
  Filled 2024-03-01 (×17): qty 1

## 2024-03-01 MED ORDER — PANTOPRAZOLE SODIUM 40 MG IV SOLR
40.0000 mg | Freq: Two times a day (BID) | INTRAVENOUS | Status: DC
  Administered 2024-03-01 – 2024-03-06 (×11): 40 mg via INTRAVENOUS
  Filled 2024-03-01 (×11): qty 10

## 2024-03-01 MED ORDER — SODIUM CHLORIDE (PF) 0.9 % IJ SOLN
INTRAMUSCULAR | Status: AC
  Administered 2024-03-01: 10 mL
  Filled 2024-03-01: qty 10

## 2024-03-01 MED ORDER — ONDANSETRON HCL 4 MG/2ML IJ SOLN
4.0000 mg | Freq: Four times a day (QID) | INTRAMUSCULAR | Status: DC | PRN
  Administered 2024-03-01: 4 mg via INTRAVENOUS
  Filled 2024-03-01: qty 2

## 2024-03-01 MED ORDER — POTASSIUM CHLORIDE 20 MEQ PO PACK
40.0000 meq | PACK | Freq: Once | ORAL | Status: AC
  Administered 2024-03-01: 40 meq via ORAL
  Filled 2024-03-01: qty 2

## 2024-03-01 MED ORDER — MAGNESIUM SULFATE 2 GM/50ML IV SOLN
2.0000 g | Freq: Once | INTRAVENOUS | Status: AC
  Administered 2024-03-01: 2 g via INTRAVENOUS
  Filled 2024-03-01: qty 50

## 2024-03-01 MED ORDER — ACETAMINOPHEN 650 MG RE SUPP: 650.0000 mg | Freq: Four times a day (QID) | RECTAL | Status: DC | PRN

## 2024-03-01 MED ORDER — ONDANSETRON HCL 4 MG PO TABS: 4.0000 mg | ORAL_TABLET | Freq: Four times a day (QID) | ORAL | Status: DC | PRN

## 2024-03-01 NOTE — Plan of Care (Signed)

## 2024-03-01 NOTE — Consult Note (Addendum)
 Cardiology Consultation  Patient ID: Shannon Scott MRN: [id number]; DOB: 02/28/1941  Admit date: 02/29/2024 Date of Consult: 03/01/2024  PCP:  Elliot Charm, MD   [facility] Providers Cardiologist:  Vinie JAYSON Maxcy, MD  Cardiology APP:  Rana Lum CROME, NP    Patient Profile: Shannon Scott is a 83 y.o. female with a hx of CAD s/p CABG x 3 with LIMA-LAD, SVG-OM, SVG-RCA in 2005 and s/p DES to SVG-OM 2 in 03/2023, severe aortic stenosis, locally advanced endometrial cancer s/p chemoradiation, immunotherapy, hysterectomy with BSO, colonic mass with partial colectomy and colostomy, prior DVT, paroxysmal A-fib on eliquis , carotid artery stenosis, CVA, HTN, HLD, statin intolerance, lymphedema, asthma, hypothyroidism  who is being seen 03/01/2024 for the evaluation of chest pain, elevated troponins at the request of Dr Darci.  History of Present Illness: Shannon Scott has past medical history as stated above.  She presented to the [facility] emergency department on 02/29/2024 after being informed that her hemoglobin had dropped from 10.5-6.7.  She did report that she was having generalized weakness, chest tightness, shortness of breath for a few days.  She had reported dark stools for about 1 week.  She had just recently been seen in our outpatient office by Lum Rana, NP on 02/29/2024.  At this appointment she stated that she was experiencing this chest pressure that was often triggered by physical exertion and would resolve with rest.  She did note that she was having some radiation of this pain to her jaw.  At this appointment the case was discussed with Dr. Court, and the plan was to proceed with a cardiac catheterization on 03/05/2024.  At this point she was instructed to hold her Lasix  and Eliquis , but to continue her Plavix , Repatha , Zetia .  Relevant workup while in the ED/since admission includes: Initial CBC showed hemoglobin 6.8, follow-up CBC this morning  showed hemoglobin 7.5, metabolic panel fairly stable, troponin level 104 ? 91 ? 142, magnesium  low at 1.6, positive FOBT. CXR showed mild cardiomegaly, no acute disease.  She was given 1 unit pRBC and admitted to the medicine service for suspected GI bleed.  Her Eliquis  and Plavix  were held.  She was started on IV PPI, plans to trend H&H and troponins.  In regards to her atrial fibrillation Eliquis  remains on hold, she was continued on her home diltiazem .  In regards to her chronic HFpEF, she appears euvolemic on exam, her Lasix  was continued to be held.  Cardiology was consulted in the setting of chest pain, elevated troponins, with plan for outpatient heart cath 03/05/2024.  After speaking with the patient and her family present in the room, they agree with the history as stated above.  She tells me that she has been having this chest pain/pressure for a little while now along with some shortness of breath.  She has a history of intermittent iron  deficiency/EPO deficient anemia which she is followed with heme/onc and her current therapies included: Venofer  IV PRN, Aranesp  300 mg SubQ every 3 weeks for hemoglobin < 11.  Last time that she required IV iron  was 12/01/2023.  Patient's family is present in room and discussed that her workup for her anemia in the past has been complicated by her colectomy/colostomy.  They tell me that she is actively following with hematology/oncology and receives treatment for anemia fairly frequently. Suspect that anemia is driving force behind her symptoms, will work along with GI to ensure we are properly treating anemia and will address any  residual cardiac symptoms once hemoglobin is at goal.   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 (HCC) 10/11/2017   Postoperative nausea and vomiting 01/02/2016   Past Surgical History:  Procedure Laterality Date   ABDOMINAL HYSTERECTOMY     Carotid Doppler  02/2012   40-59% right int carotid artery stenosis; 60-79% L int carotid stenosis; L carotid bruit   CORONARY ARTERY BYPASS GRAFT  03/12/2004   LIMA to LAD, SVG to circumflex, SVG to PDA   CORONARY/GRAFT ACUTE MI REVASCULARIZATION N/A 03/09/2023   Procedure: Coronary/Graft Acute MI Revascularization;  Surgeon: Wonda Sharper, MD;  Location: [facility] INVASIVE CV LAB;  Service: Cardiovascular;  Laterality: N/A;   CORONARY/GRAFT ANGIOGRAPHY N/A 03/02/2023   Procedure: CORONARY/GRAFT ANGIOGRAPHY;  Surgeon: Ladona Heinz, MD;  Location: [facility] INVASIVE CV LAB;  Service: Cardiovascular;  Laterality: N/A;   CYSTOSCOPY W/ URETERAL STENT PLACEMENT Left 12/06/2017   Procedure: CYSTOSCOPY WITH LEFT URETERAL STENT EXCHANGE;  Surgeon: Watt Rush, MD;  Location: [facility] ORS;  Service: Urology;  Laterality: Left;   CYSTOSCOPY WITH STENT PLACEMENT Bilateral 08/21/2017   Procedure: CYSTOSCOPY WITH STENT PLACEMENT;  Surgeon: Devere Lonni Righter, MD;  Location: [facility] ORS;  Service: Urology;  Laterality: Bilateral;   IR FLUORO GUIDE PORT INSERTION RIGHT  10/11/2017   IR GENERIC HISTORICAL  04/29/2016   IR RADIOLOGIST EVAL & MGMT 04/29/2016 Rush Roulette, MD [facility] INTERV RAD   IR GENERIC HISTORICAL  05/12/2016   IR RADIOLOGIST EVAL & MGMT 05/12/2016 Rush Roulette, MD [facility] INTERV RAD   IR IVC FILTER PLMT / S&I PORTER GUID/MOD SED  10/11/2017   IR US  GUIDE VASC ACCESS RIGHT  10/11/2017   ROBOTIC ASSISTED TOTAL HYSTERECTOMY WITH BILATERAL  SALPINGO OOPHERECTOMY Bilateral 12/30/2015   Procedure: XI ROBOTIC ASSISTED TOTAL HYSTERECTOMY WITH BILATERAL SALPINGO OOPHORECTOMY WITH SENTAL LYMPH NODE BIOPSY;  Surgeon: Elenore Simmonds, MD;  Location: [facility] ORS;  Service: Gynecology;  Laterality: Bilateral;   TONSILLECTOMY     TRANSTHORACIC ECHOCARDIOGRAM  04/2007   EF>55%; mild MR; mild-mod TR; mild pulm HTN; mild calcification of aortiv valve leaflets with mild valvular aortic stenosis   TUBAL LIGATION      Home Medications:  Prior to Admission medications   Medication Sig Start Date End Date Taking? Authorizing Provider  apixaban  (ELIQUIS ) 5 MG TABS tablet Take 1 tablet (5 mg total) by mouth 2 (two) times daily. 08/30/23  Yes   clopidogrel  (PLAVIX ) 75 MG tablet Take 1 tablet (75 mg total) by mouth daily with breakfast. 06/14/23  Yes   Cyanocobalamin  (VITAMIN B-12) 5000 MCG LOZG Take 1 each by mouth in the morning.   Yes [provider]  Evolocumab  (REPATHA  SURECLICK) 140 MG/ML SOAJ INJECT 1 DOSE UNDER THE SKIN EVERY 14 DAYS 01/11/24  Yes Hilty, Vinie BROCKS, MD  ezetimibe  (ZETIA ) 10 MG tablet Take 10 mg by mouth  daily.   Yes [provider]  furosemide  (LASIX ) 20 MG tablet Take 1 tablet (20 mg total) by mouth every Monday, Wednesday, and Friday. 08/19/23  Yes Hilty, Vinie BROCKS, MD  levothyroxine  (SYNTHROID ) 50 MCG tablet Take 1 tablet (50 mcg total) by mouth daily. 06/08/23  Yes   Multiple Vitamins-Minerals (ONE A DAY WOMEN 50 PLUS) CHEW Chew 1 each by mouth daily at 6 PM. One-A-Day Women's 50+ Gummies;  Chew one gummy by mouth every evening at 1800.   Yes [provider]  polyethylene glycol (MIRALAX  / GLYCOLAX ) 17 g packet Take 17 g by mouth daily as needed for severe constipation. Patient taking differently: Take 17 g by mouth daily. 03/29/20  Yes Amin, Ankit C, MD  TIADYLT  ER 300 MG 24 hr capsule Take 300 mg by mouth daily. 08/11/23  Yes [provider]  traMADol  (ULTRAM ) 50 MG tablet TAKE 2 TABLETS(100 MG) BY MOUTH  TWICE DAILY 01/31/24  Yes Ennever, Maude SAUNDERS, MD  nitroGLYCERIN  (NITROSTAT ) 0.4 MG SL tablet Place 1 tablet (0.4 mg total) under the tongue every 5 (five) minutes as needed for chest pain. Patient not taking: Reported on 03/01/2024 03/04/23   Cheryle Page, MD   Scheduled Meds:  sodium chloride    Intravenous Once   diltiazem   300 mg Oral Daily   levothyroxine   50 mcg Oral Daily   pantoprazole  (PROTONIX ) IV  40 mg Intravenous Q12H   sodium chloride  flush  3 mL Intravenous Q12H   Continuous Infusions:  PRN Meds: acetaminophen  **OR** acetaminophen , ondansetron  **OR** ondansetron  (ZOFRAN ) IV, traMADol   Allergies:    Allergies  Allergen Reactions   Statins Other (See Comments)    Myalgias and memory problems   Ciprofloxacin  Itching, Swelling and Other (See Comments)    Other reaction(s): swelling/redness at injection site   Social History:   Social History   Socioeconomic History   Marital status: Widowed    Spouse name: Not on file   Number of children: 2   Years of education: Not on file   Highest education level: Not on file  Occupational History    Employer: RETIRED  Tobacco Use   Smoking status: Never   Smokeless tobacco: Never  Vaping Use   Vaping status: Never Used  Substance and Sexual Activity   Alcohol use: No   Drug use: No   Sexual activity: Not Currently  Other Topics Concern   Not on file  Social History Narrative   Not on file   Social Drivers of Health   Financial Resource Strain: Not on file  Food Insecurity: No Food Insecurity (03/01/2024)   Hunger Vital Sign    Worried About Running Out of Food in the Last Year: Never true    Ran Out of Food in the Last Year: Never true  Transportation Needs: No Transportation Needs (03/01/2024)   PRAPARE - Administrator, Civil Service (Medical): No    Lack of Transportation (Non-Medical): No  Physical Activity: Not on file  Stress: Not on file  Social Connections: Moderately Integrated (03/01/2024)    Social Connection and Isolation Panel    Frequency of Communication with Friends and Family: More than three times a week    Frequency of Social Gatherings with Friends and Family: More than three times a week    Attends Religious Services: 1 to 4 times per year    Active Member of [employer] or Organizations: Yes    Attends Banker Meetings: More than 4 times per  year    Marital Status: Widowed  Intimate Partner Violence: Not At Risk (03/01/2024)   Humiliation, Afraid, Rape, and Kick questionnaire    Fear of Current or Ex-Partner: No    Emotionally Abused: No    Physically Abused: No    Sexually Abused: No    Family History:   Family History  Problem Relation Age of Onset   Hypertension Mother    Heart disease Mother        Died in her 5s   Stroke Father    Kidney disease Brother    Heart disease Brother        also HTN, hyperlipidemia   Heart attack Brother    Stroke Sister        x2   Hypertension Sister     ROS:  Please see the history of present illness.  All other ROS reviewed and negative.     Physical Exam/Data: Vitals:   03/01/24 0432 03/01/24 0548 03/01/24 0845 03/01/24 1113  BP: (!) 127/59  117/68 (!) 112/56  Pulse: 70  82 99  Resp: 17  18   Temp: 98.1 F (36.7 C)  98.3 F (36.8 C)   TempSrc: Oral     SpO2: 99%  91% (!) 89%  Weight:  57 kg    Height:  5' 1 (1.549 m)      Intake/Output Summary (Last 24 hours) at 03/01/2024 1203 Last data filed at 03/01/2024 1000 Gross per 24 hour  Intake 10 ml  Output 30 ml  Net -20 ml      03/01/2024    5:48 AM 02/29/2024    9:25 PM 02/29/2024    2:13 PM  Last 3 Weights  Weight (lbs) 125 lb 10.6 oz 130 lb 132 lb 3.2 oz  Weight (kg) 57 kg 58.968 kg 59.966 kg     Body mass index is 23.74 kg/m.   General:  in no acute distress, family present in room  HEENT: normal Neck: no JVD Vascular:Distal pulses 2+ bilaterally Cardiac:   RRR; SEM at RUSB Lungs:  clear to auscultation bilaterally, no wheezing,  rhonchi or rales  Abd: soft, nontender, no hepatomegaly  Ext: Chronic lymphedema Musculoskeletal:  No deformities Skin: warm and dry  Neuro:   no focal abnormalities noted Psych:  Normal affect   EKG:  The EKG was personally reviewed and demonstrates:  sinus rhythm, HR 90, ST depressions concerning for posterior MI  Relevant CV Studies:  Coronary/graft revascularization, 03/09/2023 1Known severe native vessel coronary artery disease, native coronary arteries not selectively imaged this procedure Known patency of the LIMA to LAD, this graft not selectively imaged this procedure Severe diffuse SVG to RCA disease, medical therapy recommended Acute total occlusion of a severely diseased saphenous vein graft to OM, treated successfully with primary PCI using a 3.0 x 12 mm Synergy DES   Recommendations: Okay to resume apixaban  tomorrow morning, would use aspirin  81 mg daily x 2 weeks and this patient at high bleeding risk, clopidogrel  75 mg daily x 6 months minimum favor long-term if tolerated (loaded with 600 mg in the emergency room)   Echocardiogram, 03/09/2023 Left ventricular ejection fraction, by estimation, is 55 to 60% . The left ventricle has normal function. The left ventricle demonstrates regional wall motion abnormalities ( see scoring diagram/ findings for description) . There is mild hypokinesis of the left ventricular, basal- mid anterolateral wall.  The mitral valve is degenerative. Moderate mitral valve regurgitation. No evidence of mitral stenosis. Severe mitral annular  calcification.  Tricuspid valve regurgitation is mild to moderate.  The aortic valve is calcified. There is moderate calcification of the aortic valve. There is moderate thickening of the aortic valve. Aortic valve regurgitation is trivial. Moderate aortic valve stenosis.  There is mildly elevated pulmonary artery systolic pressure. 6. The inferior vena cava is normal in size with greater than 50% respiratory  variability, suggesting right atrial pressure of 3 mmHg.  Laboratory Data: High Sensitivity Troponin:   Recent Labs  Lab 03/01/24 0006 03/01/24 0552 03/01/24 1001  TROPONINIHS 104* 91* 142*     Chemistry Recent Labs  Lab 02/29/24 1606 02/29/24 2127 03/01/24 0552  NA 141 138 138  K 4.0 3.5 3.5  CL 104 102 106  CO2 23 25 25   GLUCOSE 106* 129* 94  BUN 17 20 18   CREATININE 0.76 0.80 0.82  CALCIUM 9.0 8.5* 8.2*  MG  --   --  1.6*  GFRNONAA  --  >60 >60  ANIONGAP  --  11 7    Recent Labs  Lab 02/29/24 2127  PROT 5.4*  ALBUMIN 2.9*  AST 16  ALT 11  ALKPHOS 52  BILITOT 0.3   Lipids No results for input(s): CHOL, TRIG, HDL, LABVLDL, LDLCALC, CHOLHDL in the last 168 hours.  Hematology Recent Labs  Lab 02/29/24 1606 02/29/24 2127 03/01/24 0552  WBC 6.5 6.0 6.6  RBC 2.48* 2.47* 2.69*  HGB 6.7* 6.8* 7.5*  HCT 22.8* 22.9* 23.3*  MCV 92 92.7 86.6  MCH 27.0 27.5 27.9  MCHC 29.4* 29.7* 32.2  RDW 15.5* 17.9* 17.1*  PLT 327 335 269   Thyroid  No results for input(s): TSH, FREET4 in the last 168 hours.  BNPNo results for input(s): BNP, PROBNP in the last 168 hours.  DDimer No results for input(s): DDIMER in the last 168 hours.  Radiology/Studies:  DG Chest 2 View Result Date: 02/29/2024 CLINICAL DATA:  Chest pains. EXAM: CHEST - 2 VIEW COMPARISON:  AP portable 03/09/2023. FINDINGS: AP Lat saving Study at 11:15 p.m. Right IJ port catheter again terminates in the distal SVC. There is mild cardiomegaly without evidence for CHF. There are old sternotomy and CABG changes. Mediastinum is stable with aortic calcific plaques and mild tortuosity. The lungs are clear of infiltrates. There are scattered linear scarring or atelectasis in the lower lung fields. Osteopenia, mild kyphodextroscoliosis and degenerative change thoracic spine. IVC filter. IMPRESSION: 1. No evidence of acute chest disease. 2. Mild cardiomegaly without CHF.  Stable post CABG chest. 3. Aortic  atherosclerosis. Electronically Signed   By: Francis Quam M.D.   On: 02/29/2024 23:23   Assessment and Plan:  CAD s/p CABG x 3 in 2005 (LIMA-LAD, SVG-OM, SVG-RCA)  NSTEMI, suspect type II Inferior STEMI in 03/2023 w/ PCI/DES to SVG-OM2 Patent LIMA-LAD and severe diffuse SVG-dRCA disease  Medical therapy was recommended  Seen as an outpatient 02/29/2024 Scheduled for outpatient LHC 03/05/2024 Presented to ED due to low hemoglobin, chest pain, shortness of breath Troponin 104 ? 91 ? 142 Continue to hold home Plavix  Patients symptoms most likely related to her anemia, will order 2 units pRBC in an attempt to achieve hemoglobin > 10  Follow patients symptoms, if they do not resolve with hemoglobin increase, will consider alternative evaluations   Symptomatic anemia GI bleed History of anemia requiring IV iron , Aranesp  in past Hemoglobin 6.7 on presentation Received 1 unit pRBC with hemoglobin to 7.5 Goal hemoglobin > 10 Ordering 2 additional units of pRBC, follow hemoglobin  Follow recs per  GI in regards to GI bleed  Workup for chest pain as above   Severe aortic stenosis Followed by our structural heart team Determined to not be a candidate for TAVR Plans to continue to monitor and treat medically Patient denies any syncope Typically on Lasix  20 mg M/W/F as an outpatient Ordering updated echocardiogram   Hyperlipidemia with LDL goal < 55 Pending recent LDL  Home meds: Repatha , Zetia  10 mg daily Previously on Nexlizet , self discontinued due to severe constipation  Paroxysmal atrial fibrillation Currently in sinus rhythm History of dyspnea with beta-blockers Home meds: Eliquis  5 mg BID, diltiazem  300 mg daily Continue to hold Eliquis   Chronic HFpEF Echo from 03/2023 showed LVEF 55 to 60% History of chronic lymphedema Appears euvolemic on exam Typically on Lasix  as an outpatient Continue to hold home Lasix  Ordering updated echocardiogram   Per  primary Hypothyroidism Endometrial carcinoma Symptomatic anemia   Risk Assessment/Risk Scores:     [place]  Heart Association (NYHA) Functional Class NYHA Class II  CHA2DS2-VASc Score = 8   This indicates a 10.8% annual risk of stroke. The patient's score is based upon: CHF History: 1 HTN History: 1 Diabetes History: 0 Stroke History: 2 Vascular Disease History: 1 Age Score: 2 Gender Score: 1       For questions or updates, please contact [facility] Please consult [email or web address] for contact info under     Signed, Waddell [name], PA-C  03/01/2024 12:03 PM

## 2024-03-01 NOTE — ED Notes (Signed)
 Repeat troponin collection delayed due to blood transfusion and must be collected 2 hrs following infusion stopped EDP and receiving RN notified.

## 2024-03-01 NOTE — Progress Notes (Signed)
 Pt was transferred to [facility] d/t STEMI about 1000 originally identified on telemetry and pt did endorse chest pain. Dr. Darci present at time of EKG and notified cardiology. Blood transfusion was started pre transfer. Family present throughout. Pt calm throughout and pain was consistently 4-5/10 and MD aware. Family took belongings.

## 2024-03-01 NOTE — H&P (Signed)
 History and Physical    Shannon Scott FMW:[id number] DOB: 1940-10-25 DOA: 02/29/2024  PCP: Elliot Charm, MD   Patient coming from: Home   Chief Complaint: Chest discomfort, dark stool, low Hgb   HPI: Shannon Scott is a 83 y.o. female with medical history significant for hyperlipidemia with statin intolerance, endometrial carcinoma, CAD status post CABG in 2005, PAF on Eliquis , chronic HFpEF, lymphedema, colostomy, and chronic anemia who presents for evaluation of low hemoglobin noted on outpatient blood work.  Patient was seen in the cardiology clinic yesterday where she reported experiencing chest pressure for the past 4 days which seemed to be triggered by exertion and subside with rest.  She was sent for blood work, was noted to have hemoglobin of 6.7, and was directed to the ED.  Patient reports that her stool has been very dark for close to a week.  She denies any recent abdominal pain, nausea, or vomiting.  ED Course: Upon arrival to the ED, patient is found to be afebrile and saturating well on room air with normal HR and stable BP.  Labs are most notable for normal BUN, normal creatinine, normal WBC, hemoglobin 6.8, positive FOBT, and troponin 104.  There were no acute findings on chest x-ray.  GI was consulted by the ED PA.  ED PA discussed the case with on-call cardiology who recommended trending the troponin.  Type and screen was performed and 1 unit RBC was ordered for transfusion.   Review of Systems:  All other systems reviewed and apart from HPI, are negative.  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 (HCC) 10/11/2017   Postoperative nausea and vomiting 01/02/2016    Past Surgical History:  Procedure Laterality Date   ABDOMINAL HYSTERECTOMY     Carotid Doppler  02/2012   40-59% right int carotid artery stenosis; 60-79% L int carotid stenosis; L carotid bruit   CORONARY ARTERY BYPASS GRAFT  03/12/2004   LIMA to LAD, SVG to circumflex, SVG to PDA   CORONARY/GRAFT ACUTE MI REVASCULARIZATION N/A 03/09/2023   Procedure: Coronary/Graft Acute MI Revascularization;  Surgeon: Wonda Sharper, MD;  Location: [facility] INVASIVE CV LAB;  Service: Cardiovascular;  Laterality: N/A;   CORONARY/GRAFT ANGIOGRAPHY N/A 03/02/2023   Procedure: CORONARY/GRAFT ANGIOGRAPHY;  Surgeon: Ladona Heinz, MD;  Location: [facility] INVASIVE CV LAB;  Service: Cardiovascular;  Laterality: N/A;   CYSTOSCOPY W/ URETERAL STENT PLACEMENT Left 12/06/2017   Procedure: CYSTOSCOPY WITH LEFT URETERAL STENT EXCHANGE;  Surgeon: Watt Rush, MD;  Location: [facility];  Service: Urology;  Laterality: Left;   CYSTOSCOPY WITH STENT PLACEMENT Bilateral 08/21/2017   Procedure: CYSTOSCOPY WITH STENT PLACEMENT;  Surgeon: Devere Lonni Righter, MD;  Location: [facility];  Service: Urology;  Laterality: Bilateral;   IR FLUORO GUIDE PORT INSERTION RIGHT  10/11/2017   IR GENERIC HISTORICAL  04/29/2016  IR RADIOLOGIST EVAL & MGMT 04/29/2016 Norleen Roulette, MD [facility] INTERV RAD   IR GENERIC HISTORICAL  05/12/2016   IR RADIOLOGIST EVAL & MGMT 05/12/2016 Norleen Roulette, MD [facility] INTERV RAD   IR IVC FILTER PLMT / S&I PORTER GUID/MOD SED  10/11/2017   IR US  GUIDE VASC ACCESS RIGHT  10/11/2017   ROBOTIC ASSISTED TOTAL HYSTERECTOMY WITH BILATERAL SALPINGO OOPHERECTOMY Bilateral 12/30/2015   Procedure: XI ROBOTIC ASSISTED TOTAL HYSTERECTOMY WITH BILATERAL SALPINGO  OOPHORECTOMY WITH SENTAL LYMPH NODE BIOPSY;  Surgeon: Elenore Simmonds, MD;  Location: [facility];  Service: Gynecology;  Laterality: Bilateral;   TONSILLECTOMY     TRANSTHORACIC ECHOCARDIOGRAM  04/2007   EF>55%; mild MR; mild-mod TR; mild pulm HTN; mild calcification of aortiv valve leaflets with mild valvular aortic stenosis   TUBAL LIGATION      Social History:   reports that she has never smoked. She has never used smokeless tobacco. She reports that she does not drink alcohol and does not use drugs.  Allergies  Allergen Reactions   Statins Other (See Comments)    Myalgias and memory problems   Ciprofloxacin  Itching, Swelling and Other (See Comments)    Other reaction(s): swelling/redness at injection site    Family History  Problem Relation Age of Onset   Hypertension Mother    Heart disease Mother        Died in her 70s   Stroke Father    Kidney disease Brother    Heart disease Brother        also HTN, hyperlipidemia   Heart attack Brother    Stroke Sister        x2   Hypertension Sister      Prior to Admission medications   Medication Sig Start Date End Date Taking? Authorizing Provider  apixaban  (ELIQUIS ) 5 MG TABS tablet Take 1 tablet (5 mg total) by mouth 2 (two) times daily. 08/30/23  Yes   clopidogrel  (PLAVIX ) 75 MG tablet Take 1 tablet (75 mg total) by mouth daily with breakfast. 06/14/23  Yes   Cyanocobalamin  (VITAMIN B-12) 5000 MCG LOZG Take 1 each by mouth in the morning.   Yes [provider]  Evolocumab  (REPATHA  SURECLICK) 140 MG/ML SOAJ INJECT 1 DOSE UNDER THE SKIN EVERY 14 DAYS 01/11/24  Yes Hilty, Vinie BROCKS, MD  ezetimibe  (ZETIA ) 10 MG tablet Take 10 mg by mouth daily.   Yes [provider]  furosemide  (LASIX ) 20 MG tablet Take 1 tablet (20 mg total) by mouth every Monday, Wednesday, and Friday. 08/19/23  Yes Hilty, Vinie BROCKS, MD  levothyroxine  (SYNTHROID ) 50 MCG tablet Take 1 tablet (50 mcg total) by mouth daily. 06/08/23  Yes   Multiple  Vitamins-Minerals (ONE A DAY WOMEN 50 PLUS) CHEW Chew 1 each by mouth daily at 6 PM. One-A-Day Women's 50+ Gummies;  Chew one gummy by mouth every evening at 1800.   Yes [provider]  polyethylene glycol (MIRALAX  / GLYCOLAX ) 17 g packet Take 17 g by mouth daily as needed for severe constipation. Patient taking differently: Take 17 g by mouth daily. 03/29/20  Yes Amin, Ankit C, MD  TIADYLT  ER 300 MG 24 hr capsule Take 300 mg by mouth daily. 08/11/23  Yes [provider]  traMADol  (ULTRAM ) 50 MG tablet TAKE 2 TABLETS(100 MG) BY MOUTH TWICE DAILY 01/31/24  Yes Ennever, Maude SAUNDERS, MD  nitroGLYCERIN  (NITROSTAT ) 0.4 MG SL tablet Place 1 tablet (0.4 mg total) under the tongue every 5 (five) minutes as needed for chest pain.  Patient not taking: Reported on 03/01/2024 03/04/23   Cheryle Page, MD    Physical Exam: Vitals:   03/01/24 0030 03/01/24 0053 03/01/24 0342 03/01/24 0346  BP: (!) 122/49 (!) 115/50 119/70   Pulse: 65 80 75 77  Resp: 14 18 (!) 23 17  Temp: 97.7 F (36.5 C) 97.9 F (36.6 C) 97.9 F (36.6 C)   TempSrc: Oral Oral Oral   SpO2: 100% 97% 99% 100%  Weight:      Height:        Constitutional: NAD, no diaphoresis   Eyes: PERTLA, lids and conjunctivae normal ENMT: Mucous membranes are moist. Posterior pharynx clear of any exudate or lesions.   Neck: supple, no masses  Respiratory: no wheezing, no crackles. No accessory muscle use.  Cardiovascular: S1 & S2 heard, regular rate and rhythm. No JVD. Abdomen: No tenderness, soft. Bowel sounds active.  Musculoskeletal: no clubbing / cyanosis. No joint deformity upper and lower extremities.   Skin: no significant rashes, lesions, ulcers. Warm, dry, well-perfused. Neurologic: CN 2-12 grossly intact. Moving all extremities. Alert and oriented.  Psychiatric: Pleasant. Cooperative.    Labs and Imaging on Admission: I have personally reviewed following labs and imaging studies  CBC: Recent Labs  Lab 02/29/24 1606  02/29/24 2127  WBC 6.5 6.0  NEUTROABS  --  4.3  HGB 6.7* 6.8*  HCT 22.8* 22.9*  MCV 92 92.7  PLT 327 335   Basic Metabolic Panel: Recent Labs  Lab 02/29/24 1606 02/29/24 2127  NA WILL FOLLOW 138  K WILL FOLLOW 3.5  CL WILL FOLLOW 102  CO2 WILL FOLLOW 25  GLUCOSE WILL FOLLOW 129*  BUN WILL FOLLOW 20  CREATININE WILL FOLLOW 0.80  CALCIUM WILL FOLLOW 8.5*   GFR: Estimated Creatinine Clearance: 44.8 mL/min (by C-G formula based on SCr of 0.8 mg/dL). Liver Function Tests: Recent Labs  Lab 02/29/24 2127  AST 16  ALT 11  ALKPHOS 52  BILITOT 0.3  PROT 5.4*  ALBUMIN 2.9*   No results for input(s): LIPASE, AMYLASE in the last 168 hours. No results for input(s): AMMONIA in the last 168 hours. Coagulation Profile: Recent Labs  Lab 03/01/24 0006  INR 1.8*   Cardiac Enzymes: No results for input(s): CKTOTAL, CKMB, CKMBINDEX, TROPONINI in the last 168 hours. BNP (last 3 results) No results for input(s): PROBNP in the last 8760 hours. HbA1C: No results for input(s): HGBA1C in the last 72 hours. CBG: No results for input(s): GLUCAP in the last 168 hours. Lipid Profile: Recent Labs    02/29/24 1606  LDLDIRECT WILL FOLLOW   Thyroid  Function Tests: No results for input(s): TSH, T4TOTAL, FREET4, T3FREE, THYROIDAB in the last 72 hours. Anemia Panel: No results for input(s): VITAMINB12, FOLATE, FERRITIN, TIBC, IRON , RETICCTPCT in the last 72 hours. Urine analysis:    Component Value Date/Time   COLORURINE COLORLESS (A) 03/01/2023 2320   APPEARANCEUR CLEAR 03/01/2023 2320   LABSPEC 1.005 03/01/2023 2320   LABSPEC 1.005 04/14/2016 1230   PHURINE 8.0 03/01/2023 2320   GLUCOSEU NEGATIVE 03/01/2023 2320   GLUCOSEU Negative 04/14/2016 1230   HGBUR NEGATIVE 03/01/2023 2320   BILIRUBINUR NEGATIVE 03/01/2023 2320   BILIRUBINUR Negative 04/14/2016 1230   KETONESUR 5 (A) 03/01/2023 2320   PROTEINUR NEGATIVE 03/01/2023 2320    UROBILINOGEN 0.2 04/14/2016 1230   NITRITE NEGATIVE 03/01/2023 2320   LEUKOCYTESUR NEGATIVE 03/01/2023 2320   LEUKOCYTESUR Trace 04/14/2016 1230   Sepsis Labs: @LABRCNTIP (procalcitonin:4,lacticidven:4) )No results found for this or any previous visit (from the  past 240 hours).   Radiological Exams on Admission: DG Chest 2 View Result Date: 02/29/2024 CLINICAL DATA:  Chest pains. EXAM: CHEST - 2 VIEW COMPARISON:  AP portable 03/09/2023. FINDINGS: AP Lat saving Study at 11:15 p.m. Right IJ port catheter again terminates in the distal SVC. There is mild cardiomegaly without evidence for CHF. There are old sternotomy and CABG changes. Mediastinum is stable with aortic calcific plaques and mild tortuosity. The lungs are clear of infiltrates. There are scattered linear scarring or atelectasis in the lower lung fields. Osteopenia, mild kyphodextroscoliosis and degenerative change thoracic spine. IVC filter. IMPRESSION: 1. No evidence of acute chest disease. 2. Mild cardiomegaly without CHF.  Stable post CABG chest. 3. Aortic atherosclerosis. Electronically Signed   By: Francis Quam M.D.   On: 02/29/2024 23:23    EKG: Independently reviewed. Sinus rhythm.   Assessment/Plan   1. Symptomatic anemia; GI bleeding  - Dark stool for 1 week concerning for upper GIB though BUN is stable  - She is receiving 1 unit RBC in ED  - Hold Eliquis  and Plavix  for now, start IV PPI, trend H&H, continue transfusion with goal Hgb of 10 given chest pressure with elevated troponin   2. CAD with angina; elevated troponin  - Was seen in cardiology clinic 02/29/24 for angina and is planned for heart cath on 03/05/24  - Troponin is 104 in ED  - Transfuse RBCs as above, continue cardiac monitoring and trend troponin, hold Plavix  for now  3. PAF  - Hold Eliqius for now, continue diltiazem     4. Chronic HFpEF  - Appears compensated    - Hold Lasix  while NPO, monitor volume status    5. Endometrial carcinoma  - S/p  hysterectomy, radiation therapy, Taxol /carboplatin , and then pembrolizumab  under the care of Dr. Timmy    6. Hypothyroidism  - Synthroid     DVT prophylaxis: SCDs  Code Status: Full  Level of Care: Level of care: Telemetry Medical Family Communication: Daughter at bedside  Disposition Plan:  Patient is from: Home  Anticipated d/c is to: TBD Anticipated d/c date is: 03/04/24  Patient currently: Pending GI consultation, repeat troponin, stable H&H Consults called: GI  Admission status: Inpatient     Evalene GORMAN Sprinkles, MD [facility]  03/01/2024, 4:25 AM

## 2024-03-01 NOTE — Progress Notes (Signed)
2D echo attempted, patient not in room. Will try later 

## 2024-03-01 NOTE — Plan of Care (Signed)
  Problem: Education: Goal: Ability to identify signs and symptoms of gastrointestinal bleeding will improve Outcome: Progressing   Problem: Bowel/Gastric: Goal: Will show no signs and symptoms of gastrointestinal bleeding Outcome: Progressing   Problem: Fluid Volume: Goal: Will show no signs and symptoms of excessive bleeding Outcome: Progressing   Problem: Clinical Measurements: Goal: Complications related to the disease process, condition or treatment will be avoided or minimized Outcome: Progressing   Problem: Education: Goal: Knowledge of General Education information will improve Description Including pain rating scale, medication(s)/side effects and non-pharmacologic comfort measures Outcome: Progressing   

## 2024-03-01 NOTE — Plan of Care (Signed)
  Problem: Education: Goal: Ability to identify signs and symptoms of gastrointestinal bleeding will improve Outcome: Progressing   Problem: Bowel/Gastric: Goal: Will show no signs and symptoms of gastrointestinal bleeding Outcome: Progressing   Problem: Fluid Volume: Goal: Will show no signs and symptoms of excessive bleeding Outcome: Progressing   Problem: Health Behavior/Discharge Planning: Goal: Ability to manage health-related needs will improve Outcome: Progressing   Problem: Clinical Measurements: Goal: Ability to maintain clinical measurements within normal limits will improve Outcome: Progressing Goal: Will remain free from infection Outcome: Progressing Goal: Diagnostic test results will improve Outcome: Progressing Goal: Respiratory complications will improve Outcome: Progressing Goal: Cardiovascular complication will be avoided Outcome: Progressing   Problem: Activity: Goal: Risk for activity intolerance will decrease Outcome: Progressing   Problem: Nutrition: Goal: Adequate nutrition will be maintained Outcome: Progressing   Problem: Coping: Goal: Level of anxiety will decrease Outcome: Progressing   Problem: Elimination: Goal: Will not experience complications related to bowel motility Outcome: Progressing Goal: Will not experience complications related to urinary retention Outcome: Progressing   Problem: Pain Managment: Goal: General experience of comfort will improve and/or be controlled Outcome: Progressing   Problem: Safety: Goal: Ability to remain free from injury will improve Outcome: Progressing   Problem: Skin Integrity: Goal: Risk for impaired skin integrity will decrease Outcome: Progressing

## 2024-03-01 NOTE — Progress Notes (Signed)
  Echocardiogram 2D Echocardiogram has been performed.  Devora Ellouise SAUNDERS 03/01/2024, 2:54 PM

## 2024-03-01 NOTE — Progress Notes (Signed)
 12-lead EKG performed.  Critical value noted.  RN Rosina notified.

## 2024-03-01 NOTE — Consult Note (Signed)
 Reason for Consult: Melena and symptomatic anemia Referring Physician: [facility]  Shannon Scott HPI: This is an 83 year old female with a PMH of uterine cancer with sigmoid colon obstruction s/p transverse loop colostomy, Lynch Syndrome, CAD, afib, CVA, HTN, and hyperlipidemia admitted for symptomatic anemia.  The patient was initially evaluated by Cardiology and the plan was to perform a cath next week, but blood work with Cardiology showed that she was anemic.  The patient was instructed to present to the ER.  She presented to the ER with complaints of weakness and she was noted to have a drop in her HGB from 10.5 g/dL down to 6.7 g/dL.  For the past month she noticed dark stool in her ostomy bag, but she attributed the change in her bowel habits to some she ate.  The patient's baseline HGB over this past year was in the 8-9 g/dL range.  She was being followed by Dr. Timmy for her IDA and she received iron  infusions.  Her last colonoscopy was with Dr. Kristie on 08/14/2012 and a small cecal adenoma was removed.  Past Medical History:  Diagnosis Date   A-fib (HCC) 03/26/2020   Acute CVA (cerebrovascular accident) (HCC) 03/26/2020   Arthritis    Asthma    allergy induced   Bilateral carotid artery disease    L carotid bruit   Bursitis    left hip   CAD (coronary artery disease)    Cancer (HCC)    Dyslipidemia    intolerant to statins, welchol, niacin, zetia    Goals of care, counseling/discussion 10/11/2017   History of blood transfusion    History of nuclear stress test 04/24/2012   lexiscan ; normal study   Hypertension    Hypothyroidism    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 (HCC) 10/11/2017   Postoperative nausea and vomiting 01/02/2016    Past Surgical History:  Procedure Laterality Date   ABDOMINAL HYSTERECTOMY     Carotid Doppler  02/2012   40-59% right int carotid artery stenosis; 60-79% L int carotid stenosis; L carotid bruit    CORONARY ARTERY BYPASS GRAFT  03/12/2004   LIMA to LAD, SVG to circumflex, SVG to PDA   CORONARY/GRAFT ACUTE MI REVASCULARIZATION N/A 03/09/2023   Procedure: Coronary/Graft Acute MI Revascularization;  Surgeon: Wonda Sharper, MD;  Location: [facility] INVASIVE CV LAB;  Service: Cardiovascular;  Laterality: N/A;   CORONARY/GRAFT ANGIOGRAPHY N/A 03/02/2023   Procedure: CORONARY/GRAFT ANGIOGRAPHY;  Surgeon: Ladona Heinz, MD;  Location: [facility] INVASIVE CV LAB;  Service: Cardiovascular;  Laterality: N/A;   CYSTOSCOPY W/ URETERAL STENT PLACEMENT Left 12/06/2017   Procedure: CYSTOSCOPY WITH LEFT URETERAL STENT EXCHANGE;  Surgeon: Watt Rush, MD;  Location: [facility] ORS;  Service: Urology;  Laterality: Left;   CYSTOSCOPY WITH STENT PLACEMENT Bilateral 08/21/2017   Procedure: CYSTOSCOPY WITH STENT PLACEMENT;  Surgeon: Devere Lonni Righter, MD;  Location: [facility];  Service: Urology;  Laterality: Bilateral;   IR FLUORO GUIDE PORT INSERTION RIGHT  10/11/2017   IR GENERIC HISTORICAL  04/29/2016   IR RADIOLOGIST EVAL & MGMT 04/29/2016 Rush Roulette, MD [facility] INTERV RAD   IR GENERIC HISTORICAL  05/12/2016   IR RADIOLOGIST EVAL & MGMT 05/12/2016 Rush Roulette, MD [facility] INTERV RAD   IR IVC FILTER PLMT / S&I PORTER GUID/MOD SED  10/11/2017   IR US  GUIDE VASC ACCESS RIGHT  10/11/2017   ROBOTIC ASSISTED TOTAL HYSTERECTOMY WITH BILATERAL SALPINGO OOPHERECTOMY Bilateral 12/30/2015   Procedure: XI ROBOTIC ASSISTED TOTAL HYSTERECTOMY WITH BILATERAL  SALPINGO OOPHORECTOMY WITH SENTAL LYMPH NODE BIOPSY;  Surgeon: Elenore Simmonds, MD;  Location: [facility];  Service: Gynecology;  Laterality: Bilateral;   TONSILLECTOMY     TRANSTHORACIC ECHOCARDIOGRAM  04/2007   EF>55%; mild MR; mild-mod TR; mild pulm HTN; mild calcification of aortiv valve leaflets with mild valvular aortic stenosis   TUBAL LIGATION      Family History  Problem Relation Age of Onset   Hypertension Mother    Heart disease Mother        Died in her 26s   Stroke Father    Kidney  disease Brother    Heart disease Brother        also HTN, hyperlipidemia   Heart attack Brother    Stroke Sister        x2   Hypertension Sister     Social History:  reports that she has never smoked. She has never used smokeless tobacco. She reports that she does not drink alcohol and does not use drugs.  Allergies:  Allergies  Allergen Reactions   Statins Other (See Comments)    Myalgias and memory problems   Ciprofloxacin  Itching, Swelling and Other (See Comments)    Other reaction(s): swelling/redness at injection site    Medications: Scheduled:  levothyroxine   50 mcg Oral Daily   pantoprazole  (PROTONIX ) IV  40 mg Intravenous Q12H   sodium chloride  flush  3 mL Intravenous Q12H   Continuous:  magnesium  sulfate bolus IVPB 2 g (03/01/24 1603)    Results for orders placed or performed during the hospital encounter of 02/29/24 (from the past 24 hours)  Comprehensive metabolic panel     Status: Abnormal   Collection Time: 02/29/24  9:27 PM  Result Value Ref Range   Sodium 138 135 - 145 mmol/L   Potassium 3.5 3.5 - 5.1 mmol/L   Chloride 102 98 - 111 mmol/L   CO2 25 22 - 32 mmol/L   Glucose, Bld 129 (H) 70 - 99 mg/dL   BUN 20 8 - 23 mg/dL   Creatinine, Ser 9.19 0.44 - 1.00 mg/dL   Calcium 8.5 (L) 8.9 - 10.3 mg/dL   Total Protein 5.4 (L) 6.5 - 8.1 g/dL   Albumin 2.9 (L) 3.5 - 5.0 g/dL   AST 16 15 - 41 U/L   ALT 11 0 - 44 U/L   Alkaline Phosphatase 52 38 - 126 U/L   Total Bilirubin 0.3 0.0 - 1.2 mg/dL   GFR, Estimated >39 >39 mL/min   Anion gap 11 5 - 15  CBC     Status: Abnormal   Collection Time: 02/29/24  9:27 PM  Result Value Ref Range   WBC 6.0 4.0 - 10.5 K/uL   RBC 2.47 (L) 3.87 - 5.11 MIL/uL   Hemoglobin 6.8 (LL) 12.0 - 15.0 g/dL   HCT 77.0 (L) 63.9 - 53.9 %   MCV 92.7 80.0 - 100.0 fL   MCH 27.5 26.0 - 34.0 pg   MCHC 29.7 (L) 30.0 - 36.0 g/dL   RDW 82.0 (H) 88.4 - 84.4 %   Platelets 335 150 - 400 K/uL   nRBC 0.0 0.0 - 0.2 %  Differential     Status: None    Collection Time: 02/29/24  9:27 PM  Result Value Ref Range   Neutrophils Relative % 73 %   Neutro Abs 4.3 1.7 - 7.7 K/uL   Lymphocytes Relative 17 %   Lymphs Abs 1.0 0.7 - 4.0 K/uL   Monocytes Relative 8 %  Monocytes Absolute 0.5 0.1 - 1.0 K/uL   Eosinophils Relative 1 %   Eosinophils Absolute 0.1 0.0 - 0.5 K/uL   Basophils Relative 1 %   Basophils Absolute 0.0 0.0 - 0.1 K/uL   Immature Granulocytes 0 %   Abs Immature Granulocytes 0.02 0.00 - 0.07 K/uL  Type and screen [facility]     Status: None (Preliminary result)   Collection Time: 02/29/24  9:44 PM  Result Value Ref Range   ABO/RH(D) O POS    Antibody Screen NEG    Sample Expiration 03/03/2024,2359    Unit Number [id number]    Blood Component Type RED CELLS,LR    Unit division 00    Status of Unit ISSUED    Transfusion Status OK TO TRANSFUSE    Crossmatch Result Compatible    Unit Number [id number]    Blood Component Type RED CELLS,LR    Unit division 00    Status of Unit ALLOCATED    Transfusion Status OK TO TRANSFUSE    Crossmatch Result Compatible    Unit Number [id number]    Blood Component Type RED CELLS,LR    Unit division 00    Status of Unit ISSUED    Transfusion Status OK TO TRANSFUSE    Crossmatch Result      Compatible Performed at [facility], [address], [place], [place] [address]   Prepare RBC (crossmatch)     Status: None   Collection Time: 02/29/24 11:26 PM  Result Value Ref Range   Order Confirmation      ORDER PROCESSED BY BLOOD BANK Performed at [facility], [address] [place], [place] [address]   Troponin I (High Sensitivity)     Status: Abnormal   Collection Time: 03/01/24 12:06 AM  Result Value Ref Range   Troponin I (High Sensitivity) 104 (HH) <18 ng/L  APTT     Status: Abnormal   Collection Time: 03/01/24 12:06 AM  Result Value Ref Range   aPTT 39 (H) 24 - 36 seconds  Protime-INR     Status: Abnormal   Collection Time: 03/01/24  12:06 AM  Result Value Ref Range   Prothrombin Time 21.4 (H) 11.4 - 15.2 seconds   INR 1.8 (H) 0.8 - 1.2  POC occult blood, ED RN will collect     Status: Abnormal   Collection Time: 03/01/24 12:49 AM  Result Value Ref Range   Fecal Occult Blood, POC Positive (A) Negative  Troponin I (High Sensitivity)     Status: Abnormal   Collection Time: 03/01/24  5:52 AM  Result Value Ref Range   Troponin I (High Sensitivity) 91 (H) <18 ng/L  Basic metabolic panel     Status: Abnormal   Collection Time: 03/01/24  5:52 AM  Result Value Ref Range   Sodium 138 135 - 145 mmol/L   Potassium 3.5 3.5 - 5.1 mmol/L   Chloride 106 98 - 111 mmol/L   CO2 25 22 - 32 mmol/L   Glucose, Bld 94 70 - 99 mg/dL   BUN 18 8 - 23 mg/dL   Creatinine, Ser 9.17 0.44 - 1.00 mg/dL   Calcium 8.2 (L) 8.9 - 10.3 mg/dL   GFR, Estimated >39 >39 mL/min   Anion gap 7 5 - 15  Magnesium      Status: Abnormal   Collection Time: 03/01/24  5:52 AM  Result Value Ref Range   Magnesium  1.6 (L) 1.7 - 2.4 mg/dL  CBC  Status: Abnormal   Collection Time: 03/01/24  5:52 AM  Result Value Ref Range   WBC 6.6 4.0 - 10.5 K/uL   RBC 2.69 (L) 3.87 - 5.11 MIL/uL   Hemoglobin 7.5 (L) 12.0 - 15.0 g/dL   HCT 76.6 (L) 63.9 - 53.9 %   MCV 86.6 80.0 - 100.0 fL   MCH 27.9 26.0 - 34.0 pg   MCHC 32.2 30.0 - 36.0 g/dL   RDW 82.8 (H) 88.4 - 84.4 %   Platelets 269 150 - 400 K/uL   nRBC 0.0 0.0 - 0.2 %  Troponin I (High Sensitivity)     Status: Abnormal   Collection Time: 03/01/24 10:01 AM  Result Value Ref Range   Troponin I (High Sensitivity) 142 (HH) <18 ng/L  Troponin I (High Sensitivity)     Status: Abnormal   Collection Time: 03/01/24 11:35 AM  Result Value Ref Range   Troponin I (High Sensitivity) 308 (HH) <18 ng/L  Reticulocytes     Status: Abnormal   Collection Time: 03/01/24 11:35 AM  Result Value Ref Range   Retic Ct Pct 2.8 0.4 - 3.1 %   RBC. 2.80 (L) 3.87 - 5.11 MIL/uL   Retic Count, Absolute 77.6 19.0 - 186.0 K/uL    Immature Retic Fract 30.0 (H) 2.3 - 15.9 %  Iron  and TIBC     Status: None   Collection Time: 03/01/24 11:35 AM  Result Value Ref Range   Iron  29 28 - 170 ug/dL   TIBC 744 749 - 549 ug/dL   Saturation Ratios 11 10.4 - 31.8 %   UIBC 226 ug/dL  Ferritin     Status: None   Collection Time: 03/01/24 11:35 AM  Result Value Ref Range   Ferritin 114 11 - 307 ng/mL  Brain natriuretic peptide     Status: Abnormal   Collection Time: 03/01/24 11:35 AM  Result Value Ref Range   B Natriuretic Peptide 653.3 (H) 0.0 - 100.0 pg/mL  Prepare RBC (crossmatch)     Status: None   Collection Time: 03/01/24 11:55 AM  Result Value Ref Range   Order Confirmation      ORDER PROCESSED BY BLOOD BANK Performed at [facility], [address] [place], [place] [address]    *Note: Due to a large number of results and/or encounters for the requested time period, some results have not been displayed. A complete set of results can be found in Results Review.     DG Chest 2 View Result Date: 02/29/2024 CLINICAL DATA:  Chest pains. EXAM: CHEST - 2 VIEW COMPARISON:  AP portable 03/09/2023. FINDINGS: AP Lat saving Study at 11:15 p.m. Right IJ port catheter again terminates in the distal SVC. There is mild cardiomegaly without evidence for CHF. There are old sternotomy and CABG changes. Mediastinum is stable with aortic calcific plaques and mild tortuosity. The lungs are clear of infiltrates. There are scattered linear scarring or atelectasis in the lower lung fields. Osteopenia, mild kyphodextroscoliosis and degenerative change thoracic spine. IVC filter. IMPRESSION: 1. No evidence of acute chest disease. 2. Mild cardiomegaly without CHF.  Stable post CABG chest. 3. Aortic atherosclerosis. Electronically Signed   By: Francis Quam M.D.   On: 02/29/2024 23:23    ROS:  As stated above in the HPI otherwise negative.  Blood pressure (!) 116/51, pulse 94, temperature 98.1 F (36.7 C), temperature source Oral, resp.  rate 18, height 5' 1 (1.549 m), weight 57 kg, SpO2 94%.  PE: Gen: NAD, Alert and Oriented HEENT:  [place]/AT, EOMI Neck: Supple, no LAD Lungs: CTA Bilaterally CV: RRR without M/G/R ABD: Soft, maroon stool in the ostomy bag, +BS Ext: No C/C/E  Assessment/Plan: 1) Anemia. 2) Heme positive stool. 3) Lynch Syndrome. 4) Uterine cancer.   The ostomy bag was examined and there was maroon stool.  Further evaluation with an EGD/colonoscopy will be performed.  The proximal portion of the colon will be able to be examined as it will be prepped, but it is not clear how much of the distal portion of the colon can be prepped.  The path of least resistance for stool and blood from the GI tract is to empty into the ostomy bag.  It is hoped that the site of bleeding can be isolated proximal to the bag or in the upper GI tract.  Plan: 1) EGD/colonoscopy tomorrow. 2) Transfuse and follow HGB.  Sabastion Hrdlicka D 03/01/2024, 4:22 PM

## 2024-03-01 NOTE — H&P (View-Only) (Signed)
 Reason for Consult: Melena and symptomatic anemia Referring Physician: [facility]  Shannon Scott HPI: This is an 83 year old female with a PMH of uterine cancer with sigmoid colon obstruction s/p transverse loop colostomy, Lynch Syndrome, CAD, afib, CVA, HTN, and hyperlipidemia admitted for symptomatic anemia.  The patient was initially evaluated by Cardiology and the plan was to perform a cath next week, but blood work with Cardiology showed that she was anemic.  The patient was instructed to present to the ER.  She presented to the ER with complaints of weakness and she was noted to have a drop in her HGB from 10.5 g/dL down to 6.7 g/dL.  For the past month she noticed dark stool in her ostomy bag, but she attributed the change in her bowel habits to some she ate.  The patient's baseline HGB over this past year was in the 8-9 g/dL range.  She was being followed by Dr. Timmy for her IDA and she received iron  infusions.  Her last colonoscopy was with Dr. Kristie on 08/14/2012 and a small cecal adenoma was removed.  Past Medical History:  Diagnosis Date   A-fib (HCC) 03/26/2020   Acute CVA (cerebrovascular accident) (HCC) 03/26/2020   Arthritis    Asthma    allergy induced   Bilateral carotid artery disease    L carotid bruit   Bursitis    left hip   CAD (coronary artery disease)    Cancer (HCC)    Dyslipidemia    intolerant to statins, welchol, niacin, zetia    Goals of care, counseling/discussion 10/11/2017   History of blood transfusion    History of nuclear stress test 04/24/2012   lexiscan ; normal study   Hypertension    Hypothyroidism    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 (HCC) 10/11/2017   Postoperative nausea and vomiting 01/02/2016    Past Surgical History:  Procedure Laterality Date   ABDOMINAL HYSTERECTOMY     Carotid Doppler  02/2012   40-59% right int carotid artery stenosis; 60-79% L int carotid stenosis; L carotid bruit    CORONARY ARTERY BYPASS GRAFT  03/12/2004   LIMA to LAD, SVG to circumflex, SVG to PDA   CORONARY/GRAFT ACUTE MI REVASCULARIZATION N/A 03/09/2023   Procedure: Coronary/Graft Acute MI Revascularization;  Surgeon: Wonda Sharper, MD;  Location: [facility] INVASIVE CV LAB;  Service: Cardiovascular;  Laterality: N/A;   CORONARY/GRAFT ANGIOGRAPHY N/A 03/02/2023   Procedure: CORONARY/GRAFT ANGIOGRAPHY;  Surgeon: Ladona Heinz, MD;  Location: [facility] INVASIVE CV LAB;  Service: Cardiovascular;  Laterality: N/A;   CYSTOSCOPY W/ URETERAL STENT PLACEMENT Left 12/06/2017   Procedure: CYSTOSCOPY WITH LEFT URETERAL STENT EXCHANGE;  Surgeon: Watt Rush, MD;  Location: [facility] ORS;  Service: Urology;  Laterality: Left;   CYSTOSCOPY WITH STENT PLACEMENT Bilateral 08/21/2017   Procedure: CYSTOSCOPY WITH STENT PLACEMENT;  Surgeon: Devere Lonni Righter, MD;  Location: [facility] ORS;  Service: Urology;  Laterality: Bilateral;   IR FLUORO GUIDE PORT INSERTION RIGHT  10/11/2017   IR GENERIC HISTORICAL  04/29/2016   IR RADIOLOGIST EVAL & MGMT 04/29/2016 Rush Roulette, MD [facility] INTERV RAD   IR GENERIC HISTORICAL  05/12/2016   IR RADIOLOGIST EVAL & MGMT 05/12/2016 Rush Roulette, MD [facility] INTERV RAD   IR IVC FILTER PLMT / S&I PORTER GUID/MOD SED  10/11/2017   IR US  GUIDE VASC ACCESS RIGHT  10/11/2017   ROBOTIC ASSISTED TOTAL HYSTERECTOMY WITH BILATERAL SALPINGO OOPHERECTOMY Bilateral 12/30/2015   Procedure: XI ROBOTIC ASSISTED TOTAL HYSTERECTOMY WITH BILATERAL  SALPINGO OOPHORECTOMY WITH SENTAL LYMPH NODE BIOPSY;  Surgeon: Elenore Simmonds, MD;  Location: [facility] ORS;  Service: Gynecology;  Laterality: Bilateral;   TONSILLECTOMY     TRANSTHORACIC ECHOCARDIOGRAM  04/2007   EF>55%; mild MR; mild-mod TR; mild pulm HTN; mild calcification of aortiv valve leaflets with mild valvular aortic stenosis   TUBAL LIGATION      Family History  Problem Relation Age of Onset   Hypertension Mother    Heart disease Mother        Died in her 26s   Stroke Father    Kidney  disease Brother    Heart disease Brother        also HTN, hyperlipidemia   Heart attack Brother    Stroke Sister        x2   Hypertension Sister     Social History:  reports that she has never smoked. She has never used smokeless tobacco. She reports that she does not drink alcohol and does not use drugs.  Allergies:  Allergies  Allergen Reactions   Statins Other (See Comments)    Myalgias and memory problems   Ciprofloxacin  Itching, Swelling and Other (See Comments)    Other reaction(s): swelling/redness at injection site    Medications: Scheduled:  levothyroxine   50 mcg Oral Daily   pantoprazole  (PROTONIX ) IV  40 mg Intravenous Q12H   sodium chloride  flush  3 mL Intravenous Q12H   Continuous:  magnesium  sulfate bolus IVPB 2 g (03/01/24 1603)    Results for orders placed or performed during the hospital encounter of 02/29/24 (from the past 24 hours)  Comprehensive metabolic panel     Status: Abnormal   Collection Time: 02/29/24  9:27 PM  Result Value Ref Range   Sodium 138 135 - 145 mmol/L   Potassium 3.5 3.5 - 5.1 mmol/L   Chloride 102 98 - 111 mmol/L   CO2 25 22 - 32 mmol/L   Glucose, Bld 129 (H) 70 - 99 mg/dL   BUN 20 8 - 23 mg/dL   Creatinine, Ser 9.19 0.44 - 1.00 mg/dL   Calcium 8.5 (L) 8.9 - 10.3 mg/dL   Total Protein 5.4 (L) 6.5 - 8.1 g/dL   Albumin 2.9 (L) 3.5 - 5.0 g/dL   AST 16 15 - 41 U/L   ALT 11 0 - 44 U/L   Alkaline Phosphatase 52 38 - 126 U/L   Total Bilirubin 0.3 0.0 - 1.2 mg/dL   GFR, Estimated >39 >39 mL/min   Anion gap 11 5 - 15  CBC     Status: Abnormal   Collection Time: 02/29/24  9:27 PM  Result Value Ref Range   WBC 6.0 4.0 - 10.5 K/uL   RBC 2.47 (L) 3.87 - 5.11 MIL/uL   Hemoglobin 6.8 (LL) 12.0 - 15.0 g/dL   HCT 77.0 (L) 63.9 - 53.9 %   MCV 92.7 80.0 - 100.0 fL   MCH 27.5 26.0 - 34.0 pg   MCHC 29.7 (L) 30.0 - 36.0 g/dL   RDW 82.0 (H) 88.4 - 84.4 %   Platelets 335 150 - 400 K/uL   nRBC 0.0 0.0 - 0.2 %  Differential     Status: None    Collection Time: 02/29/24  9:27 PM  Result Value Ref Range   Neutrophils Relative % 73 %   Neutro Abs 4.3 1.7 - 7.7 K/uL   Lymphocytes Relative 17 %   Lymphs Abs 1.0 0.7 - 4.0 K/uL   Monocytes Relative 8 %  Monocytes Absolute 0.5 0.1 - 1.0 K/uL   Eosinophils Relative 1 %   Eosinophils Absolute 0.1 0.0 - 0.5 K/uL   Basophils Relative 1 %   Basophils Absolute 0.0 0.0 - 0.1 K/uL   Immature Granulocytes 0 %   Abs Immature Granulocytes 0.02 0.00 - 0.07 K/uL  Type and screen [facility]     Status: None (Preliminary result)   Collection Time: 02/29/24  9:44 PM  Result Value Ref Range   ABO/RH(D) O POS    Antibody Screen NEG    Sample Expiration 03/03/2024,2359    Unit Number [id number]    Blood Component Type RED CELLS,LR    Unit division 00    Status of Unit ISSUED    Transfusion Status OK TO TRANSFUSE    Crossmatch Result Compatible    Unit Number [id number]    Blood Component Type RED CELLS,LR    Unit division 00    Status of Unit ALLOCATED    Transfusion Status OK TO TRANSFUSE    Crossmatch Result Compatible    Unit Number [id number]    Blood Component Type RED CELLS,LR    Unit division 00    Status of Unit ISSUED    Transfusion Status OK TO TRANSFUSE    Crossmatch Result      Compatible Performed at [facility], [address], [place], [place] [address]   Prepare RBC (crossmatch)     Status: None   Collection Time: 02/29/24 11:26 PM  Result Value Ref Range   Order Confirmation      ORDER PROCESSED BY BLOOD BANK Performed at [facility], [address] [place], [place] [address]   Troponin I (High Sensitivity)     Status: Abnormal   Collection Time: 03/01/24 12:06 AM  Result Value Ref Range   Troponin I (High Sensitivity) 104 (HH) <18 ng/L  APTT     Status: Abnormal   Collection Time: 03/01/24 12:06 AM  Result Value Ref Range   aPTT 39 (H) 24 - 36 seconds  Protime-INR     Status: Abnormal   Collection Time: 03/01/24  12:06 AM  Result Value Ref Range   Prothrombin Time 21.4 (H) 11.4 - 15.2 seconds   INR 1.8 (H) 0.8 - 1.2  POC occult blood, ED RN will collect     Status: Abnormal   Collection Time: 03/01/24 12:49 AM  Result Value Ref Range   Fecal Occult Blood, POC Positive (A) Negative  Troponin I (High Sensitivity)     Status: Abnormal   Collection Time: 03/01/24  5:52 AM  Result Value Ref Range   Troponin I (High Sensitivity) 91 (H) <18 ng/L  Basic metabolic panel     Status: Abnormal   Collection Time: 03/01/24  5:52 AM  Result Value Ref Range   Sodium 138 135 - 145 mmol/L   Potassium 3.5 3.5 - 5.1 mmol/L   Chloride 106 98 - 111 mmol/L   CO2 25 22 - 32 mmol/L   Glucose, Bld 94 70 - 99 mg/dL   BUN 18 8 - 23 mg/dL   Creatinine, Ser 9.17 0.44 - 1.00 mg/dL   Calcium 8.2 (L) 8.9 - 10.3 mg/dL   GFR, Estimated >39 >39 mL/min   Anion gap 7 5 - 15  Magnesium      Status: Abnormal   Collection Time: 03/01/24  5:52 AM  Result Value Ref Range   Magnesium  1.6 (L) 1.7 - 2.4 mg/dL  CBC  Status: Abnormal   Collection Time: 03/01/24  5:52 AM  Result Value Ref Range   WBC 6.6 4.0 - 10.5 K/uL   RBC 2.69 (L) 3.87 - 5.11 MIL/uL   Hemoglobin 7.5 (L) 12.0 - 15.0 g/dL   HCT 76.6 (L) 63.9 - 53.9 %   MCV 86.6 80.0 - 100.0 fL   MCH 27.9 26.0 - 34.0 pg   MCHC 32.2 30.0 - 36.0 g/dL   RDW 82.8 (H) 88.4 - 84.4 %   Platelets 269 150 - 400 K/uL   nRBC 0.0 0.0 - 0.2 %  Troponin I (High Sensitivity)     Status: Abnormal   Collection Time: 03/01/24 10:01 AM  Result Value Ref Range   Troponin I (High Sensitivity) 142 (HH) <18 ng/L  Troponin I (High Sensitivity)     Status: Abnormal   Collection Time: 03/01/24 11:35 AM  Result Value Ref Range   Troponin I (High Sensitivity) 308 (HH) <18 ng/L  Reticulocytes     Status: Abnormal   Collection Time: 03/01/24 11:35 AM  Result Value Ref Range   Retic Ct Pct 2.8 0.4 - 3.1 %   RBC. 2.80 (L) 3.87 - 5.11 MIL/uL   Retic Count, Absolute 77.6 19.0 - 186.0 K/uL    Immature Retic Fract 30.0 (H) 2.3 - 15.9 %  Iron  and TIBC     Status: None   Collection Time: 03/01/24 11:35 AM  Result Value Ref Range   Iron  29 28 - 170 ug/dL   TIBC 744 749 - 549 ug/dL   Saturation Ratios 11 10.4 - 31.8 %   UIBC 226 ug/dL  Ferritin     Status: None   Collection Time: 03/01/24 11:35 AM  Result Value Ref Range   Ferritin 114 11 - 307 ng/mL  Brain natriuretic peptide     Status: Abnormal   Collection Time: 03/01/24 11:35 AM  Result Value Ref Range   B Natriuretic Peptide 653.3 (H) 0.0 - 100.0 pg/mL  Prepare RBC (crossmatch)     Status: None   Collection Time: 03/01/24 11:55 AM  Result Value Ref Range   Order Confirmation      ORDER PROCESSED BY BLOOD BANK Performed at [facility], [address] [place], [place] [address]    *Note: Due to a large number of results and/or encounters for the requested time period, some results have not been displayed. A complete set of results can be found in Results Review.     DG Chest 2 View Result Date: 02/29/2024 CLINICAL DATA:  Chest pains. EXAM: CHEST - 2 VIEW COMPARISON:  AP portable 03/09/2023. FINDINGS: AP Lat saving Study at 11:15 p.m. Right IJ port catheter again terminates in the distal SVC. There is mild cardiomegaly without evidence for CHF. There are old sternotomy and CABG changes. Mediastinum is stable with aortic calcific plaques and mild tortuosity. The lungs are clear of infiltrates. There are scattered linear scarring or atelectasis in the lower lung fields. Osteopenia, mild kyphodextroscoliosis and degenerative change thoracic spine. IVC filter. IMPRESSION: 1. No evidence of acute chest disease. 2. Mild cardiomegaly without CHF.  Stable post CABG chest. 3. Aortic atherosclerosis. Electronically Signed   By: Francis Quam M.D.   On: 02/29/2024 23:23    ROS:  As stated above in the HPI otherwise negative.  Blood pressure (!) 116/51, pulse 94, temperature 98.1 F (36.7 C), temperature source Oral, resp.  rate 18, height 5' 1 (1.549 m), weight 57 kg, SpO2 94%.  PE: Gen: NAD, Alert and Oriented HEENT:  [place]/AT, EOMI Neck: Supple, no LAD Lungs: CTA Bilaterally CV: RRR without M/G/R ABD: Soft, maroon stool in the ostomy bag, +BS Ext: No C/C/E  Assessment/Plan: 1) Anemia. 2) Heme positive stool. 3) Lynch Syndrome. 4) Uterine cancer.   The ostomy bag was examined and there was maroon stool.  Further evaluation with an EGD/colonoscopy will be performed.  The proximal portion of the colon will be able to be examined as it will be prepped, but it is not clear how much of the distal portion of the colon can be prepped.  The path of least resistance for stool and blood from the GI tract is to empty into the ostomy bag.  It is hoped that the site of bleeding can be isolated proximal to the bag or in the upper GI tract.  Plan: 1) EGD/colonoscopy tomorrow. 2) Transfuse and follow HGB.  Sabastion Hrdlicka D 03/01/2024, 4:22 PM

## 2024-03-01 NOTE — Progress Notes (Signed)
 Courtesy visit- No billing.  Patient is seen and examined today morning.  She is admitted for evaluation of symptomatic anemia, upper GI bleed.  During my exam she did complain of chest discomfort, has elevated troponin, abnormal EKG with ST-T changes.  Family at bedside.  Plan: Patient is seen by cardiologist who recommended blood transfusions to keep hemoglobin greater than 10, GI evaluation. PRBC transfusions ordered. Discussed with Dr. Rollin, who will see the patient today, plan for EGD tomorrow morning.  Started clears for now.  Discussed with family and updated daughter regarding the plan.

## 2024-03-02 ENCOUNTER — Other Ambulatory Visit: Payer: Self-pay

## 2024-03-02 ENCOUNTER — Inpatient Hospital Stay (HOSPITAL_COMMUNITY): Admitting: Anesthesiology

## 2024-03-02 ENCOUNTER — Encounter (HOSPITAL_COMMUNITY)
Admission: EM | Disposition: A | Discharge status: Home Health | Payer: Self-pay | Source: Ambulatory Visit | Attending: Internal Medicine

## 2024-03-02 ENCOUNTER — Inpatient Hospital Stay (HOSPITAL_COMMUNITY)

## 2024-03-02 DIAGNOSIS — I10 Essential (primary) hypertension: Secondary | ICD-10-CM

## 2024-03-02 DIAGNOSIS — K449 Diaphragmatic hernia without obstruction or gangrene: Secondary | ICD-10-CM | POA: Diagnosis not present

## 2024-03-02 DIAGNOSIS — K921 Melena: Secondary | ICD-10-CM | POA: Diagnosis not present

## 2024-03-02 DIAGNOSIS — K920 Hematemesis: Secondary | ICD-10-CM

## 2024-03-02 DIAGNOSIS — I2510 Atherosclerotic heart disease of native coronary artery without angina pectoris: Secondary | ICD-10-CM

## 2024-03-02 DIAGNOSIS — D649 Anemia, unspecified: Secondary | ICD-10-CM

## 2024-03-02 HISTORY — PX: ESOPHAGOGASTRODUODENOSCOPY: SHX5428

## 2024-03-02 HISTORY — PX: COLONOSCOPY: SHX5424

## 2024-03-02 LAB — BPAM RBC
Blood Product Expiration Date: 202511022359
Unit Type and Rh: 5100

## 2024-03-02 LAB — BASIC METABOLIC PANEL WITH GFR
Anion gap: 13 (ref 5–15)
BUN/Creatinine Ratio: 22 (ref 12–28)
BUN: 17 mg/dL (ref 8–27)
BUN: 14 mg/dL (ref 8–23)
CO2: 23 mmol/L (ref 20–29)
CO2: 25 mmol/L (ref 22–32)
Calcium: 9 mg/dL (ref 8.7–10.3)
Calcium: 8.2 mg/dL — ABNORMAL LOW (ref 8.9–10.3)
Chloride: 104 mmol/L (ref 96–106)
Chloride: 102 mmol/L (ref 98–111)
Creatinine, Ser: 0.76 mg/dL (ref 0.57–1.00)
Creatinine, Ser: 0.92 mg/dL (ref 0.44–1.00)
GFR, Estimated: >60 mL/min (ref >60)
Glucose, Bld: 127 mg/dL — ABNORMAL HIGH (ref 70–99)
Glucose: 106 mg/dL — AB (ref 70–99)
Potassium: 4 mmol/L (ref 3.5–5.2)
Potassium: 3.9 mmol/L (ref 3.5–5.1)
Sodium: 141 mmol/L (ref 134–144)
Sodium: 140 mmol/L (ref 135–145)
eGFR: 78 mL/min/1.73 (ref >59)

## 2024-03-02 LAB — HEMOGLOBIN AND HEMATOCRIT, BLOOD
HCT: 32.5 % — ABNORMAL LOW (ref 36.0–46.0)
HCT: 34.2 % — ABNORMAL LOW (ref 36.0–46.0)
Hemoglobin: 10.5 g/dL — ABNORMAL LOW (ref 12.0–15.0)
Hemoglobin: 10.7 g/dL — ABNORMAL LOW (ref 12.0–15.0)

## 2024-03-02 LAB — CBC
HCT: 30.9 % — ABNORMAL LOW (ref 36.0–46.0)
Hematocrit: 22.8 % — ABNORMAL LOW (ref 34.0–46.6)
Hemoglobin: 6.7 g/dL — CL (ref 11.1–15.9)
Hemoglobin: 9.8 g/dL — ABNORMAL LOW (ref 12.0–15.0)
MCH: 27 pg (ref 26.6–33.0)
MCH: 27.6 pg (ref 26.0–34.0)
MCHC: 29.4 g/dL — ABNORMAL LOW (ref 31.5–35.7)
MCHC: 31.7 g/dL (ref 30.0–36.0)
MCV: 92 fL (ref 79–97)
MCV: 87 fL (ref 80.0–100.0)
Platelets: 327 x10E3/uL (ref 150–450)
Platelets: 321 K/uL (ref 150–400)
RBC: 2.48 x10E6/uL — CL (ref 3.77–5.28)
RBC: 3.55 MIL/uL — ABNORMAL LOW (ref 3.87–5.11)
RDW: 15.5 % — ABNORMAL HIGH (ref 11.7–15.4)
RDW: 16.9 % — ABNORMAL HIGH (ref 11.5–15.5)
WBC: 6.5 x10E3/uL (ref 3.4–10.8)
WBC: 12.1 K/uL — ABNORMAL HIGH (ref 4.0–10.5)
nRBC: 0 % (ref 0.0–0.2)

## 2024-03-02 LAB — PREPARE RBC (CROSSMATCH)

## 2024-03-02 LAB — LDL CHOLESTEROL, DIRECT: LDL Direct: 108 mg/dL — AB (ref 0–99)

## 2024-03-02 LAB — HAPTOGLOBIN: Haptoglobin: 185 mg/dL (ref 41–333)

## 2024-03-02 SURGERY — EGD (ESOPHAGOGASTRODUODENOSCOPY)
Anesthesia: Monitor Anesthesia Care

## 2024-03-02 MED ORDER — LIDOCAINE 2% (20 MG/ML) 5 ML SYRINGE
INTRAMUSCULAR | Status: DC | PRN
  Administered 2024-03-02: 50 mg via INTRAVENOUS

## 2024-03-02 MED ORDER — METOPROLOL TARTRATE 5 MG/5ML IV SOLN
INTRAVENOUS | Status: AC
  Filled 2024-03-02: qty 5

## 2024-03-02 MED ORDER — PROPOFOL 500 MG/50ML IV EMUL
INTRAVENOUS | Status: DC | PRN
  Administered 2024-03-02: 20 mg via INTRAVENOUS
  Administered 2024-03-02: 75 ug/kg/min via INTRAVENOUS

## 2024-03-02 MED ORDER — METOPROLOL TARTRATE 12.5 MG HALF TABLET
12.5000 mg | ORAL_TABLET | Freq: Four times a day (QID) | ORAL | Status: DC
  Filled 2024-03-02: qty 1

## 2024-03-02 MED ORDER — ADENOSINE 6 MG/2ML IV SOLN
INTRAVENOUS | Status: AC
  Filled 2024-03-02: qty 2

## 2024-03-02 MED ORDER — METOPROLOL TARTRATE 12.5 MG HALF TABLET
12.5000 mg | ORAL_TABLET | Freq: Four times a day (QID) | ORAL | Status: DC
  Administered 2024-03-02 – 2024-03-03 (×3): 12.5 mg via ORAL
  Filled 2024-03-02 (×3): qty 1

## 2024-03-02 MED ORDER — NOREPINEPHRINE BITARTRATE 1 MG/ML IV SOLN
INTRAVENOUS | Status: DC | PRN
  Administered 2024-03-02: .5 mL via INTRAVENOUS

## 2024-03-02 MED ORDER — ESMOLOL HCL 100 MG/10ML IV SOLN
INTRAVENOUS | Status: DC | PRN
  Administered 2024-03-02 (×2): 20 mg via INTRAVENOUS

## 2024-03-02 MED ORDER — NOREPINEPHRINE 4 MG/250ML-% IV SOLN
INTRAVENOUS | Status: DC | PRN
  Administered 2024-03-02: 4 ug/min via INTRAVENOUS

## 2024-03-02 MED ORDER — DEXAMETHASONE SODIUM PHOSPHATE 10 MG/ML IJ SOLN
INTRAMUSCULAR | Status: DC | PRN
  Administered 2024-03-02: 5 mg via INTRAVENOUS

## 2024-03-02 MED ORDER — METOPROLOL TARTRATE 5 MG/5ML IV SOLN: 5.0000 mg | Freq: Once | INTRAVENOUS | Status: AC

## 2024-03-02 MED ORDER — PHENYLEPHRINE HCL-NACL 20-0.9 MG/250ML-% IV SOLN
INTRAVENOUS | Status: DC | PRN
  Administered 2024-03-02: 10 ug/min via INTRAVENOUS

## 2024-03-02 MED ORDER — ADENOSINE 6 MG/2ML IV SOLN: 6.0000 mg | Freq: Once | INTRAVENOUS | Status: DC

## 2024-03-02 MED ORDER — METOPROLOL TARTRATE 5 MG/5ML IV SOLN
INTRAVENOUS | Status: AC
  Administered 2024-03-02: 1 mg
  Filled 2024-03-02: qty 5

## 2024-03-02 MED ORDER — MESALAMINE 4 G RE ENEM
4.0000 g | ENEMA | Freq: Every day | RECTAL | Status: DC
  Administered 2024-03-03 – 2024-03-04 (×3): 4 g via RECTAL
  Filled 2024-03-02 (×5): qty 60

## 2024-03-02 MED ORDER — SODIUM CHLORIDE 0.9 % IV SOLN: INTRAVENOUS | Status: DC

## 2024-03-02 MED ORDER — FUROSEMIDE 10 MG/ML IJ SOLN
40.0000 mg | Freq: Once | INTRAMUSCULAR | Status: AC
  Administered 2024-03-02: 40 mg via INTRAVENOUS
  Filled 2024-03-02: qty 4

## 2024-03-02 MED ORDER — METOPROLOL TARTRATE 5 MG/5ML IV SOLN
5.0000 mg | Freq: Once | INTRAVENOUS | Status: AC
Start: 2024-03-02 — End: 2024-03-02
  Administered 2024-03-02: 5 mg via INTRAVENOUS

## 2024-03-02 MED ORDER — METOPROLOL TARTRATE 5 MG/5ML IV SOLN
2.5000 mg | Freq: Once | INTRAVENOUS | Status: AC
  Administered 2024-03-02: 2.5 mg via INTRAVENOUS

## 2024-03-02 MED ORDER — ETOMIDATE 2 MG/ML IV SOLN
INTRAVENOUS | Status: DC | PRN
  Administered 2024-03-02 (×2): 5 mg via INTRAVENOUS

## 2024-03-02 MED ORDER — SODIUM CHLORIDE 0.9% IV SOLUTION: Freq: Once | INTRAVENOUS | Status: DC

## 2024-03-02 NOTE — Plan of Care (Signed)
  Problem: Education: Goal: Ability to identify signs and symptoms of gastrointestinal bleeding will improve Outcome: Progressing   Problem: Fluid Volume: Goal: Will show no signs and symptoms of excessive bleeding Outcome: Progressing   Problem: Education: Goal: Knowledge of General Education information will improve Description: Including pain rating scale, medication(s)/side effects and non-pharmacologic comfort measures Outcome: Progressing   Problem: Clinical Measurements: Goal: Ability to maintain clinical measurements within normal limits will improve Outcome: Progressing Goal: Will remain free from infection Outcome: Progressing Goal: Diagnostic test results will improve Outcome: Progressing Goal: Respiratory complications will improve Outcome: Progressing Goal: Cardiovascular complication will be avoided Outcome: Progressing   Problem: Activity: Goal: Risk for activity intolerance will decrease Outcome: Progressing   Problem: Nutrition: Goal: Adequate nutrition will be maintained Outcome: Progressing   Problem: Coping: Goal: Level of anxiety will decrease Outcome: Progressing   Problem: Elimination: Goal: Will not experience complications related to urinary retention Outcome: Progressing   Problem: Pain Managment: Goal: General experience of comfort will improve and/or be controlled Outcome: Progressing   Problem: Safety: Goal: Ability to remain free from injury will improve Outcome: Progressing   Problem: Skin Integrity: Goal: Risk for impaired skin integrity will decrease Outcome: Progressing

## 2024-03-02 NOTE — Plan of Care (Signed)
  Problem: Education: Goal: Ability to identify signs and symptoms of gastrointestinal bleeding will improve Outcome: Progressing   Problem: Bowel/Gastric: Goal: Will show no signs and symptoms of gastrointestinal bleeding Outcome: Progressing   Problem: Fluid Volume: Goal: Will show no signs and symptoms of excessive bleeding Outcome: Progressing   

## 2024-03-02 NOTE — Plan of Care (Signed)
 Brief Cardiology Update Note  Contacted by primary RN regarding sudden increase in HR to 150 bpm with automated ECG read of STEMI. ECG reviewed and illustrates irregularly irregular tachycardia with no organized atrial activity consistent with atrial fibrillation with RVR. ST elevations are present in lead 3 but no other inferior leads. There are significant ST depressions in leads V2-V3 with pre-existing prominent R waves. Patient was complaining of an ache in the lower left breast area. Family present at bedside noted that her back pain has flared up today particularly after her GI procedure today.  We elected to treat her with 5 mg IV metoprolol  which brought her rate down to 120-130 bpm. We then gave an additional dose of 2.5 mg IV metoprolol  which brought her rate down to 100-110's. After reduction in HR, her ECG illustrated improvement in ischemic changes and her symptoms improved as well.  Unfortunately we don't have a quick remedy for her complex case of GI bleed, severe AS, and vein graft stenosis revealed on CTA of the coronaries today. She has a contraindication to anticoagulation and antiplatelet therapies due to the severe inflammation and symptomatic anemia requiring blood transfusions. Cardioversion would not be advised given the CI of anticoagulation currently. We will rate control her for now and amiodarone  for rhythm control can be considered by her primary Cardiology team in the morning.  Jawann Urbani Overnight Cardiologist

## 2024-03-02 NOTE — Progress Notes (Signed)
 Pt's HR elevated and sustained in the 150ish. Other vital signs were stable. Pt c/o pain to left side of her ribcage area 6/10. Cardiologist on-call and RRT notified. STAT EKG performed.

## 2024-03-02 NOTE — Progress Notes (Signed)
 Pt's heart rhythm converted back to sinus rhythm. EKG done to confirm the rhythm.

## 2024-03-02 NOTE — Progress Notes (Signed)
   03/02/24 2002  Assess: MEWS Score  Temp 98.2 F (36.8 C)  BP 138/65  MAP (mmHg) 87  Pulse Rate (!) 153  ECG Heart Rate (!) 153  Resp (!) 21  SpO2 91 %  O2 Device Nasal Cannula  O2 Flow Rate (L/min) 2 L/min  Assess: MEWS Score  MEWS Temp 0  MEWS Systolic 0  MEWS Pulse 3  MEWS RR 1  MEWS LOC 0  MEWS Score 4  MEWS Score Color Red  Assess: if the MEWS score is Yellow or Red  Were vital signs accurate and taken at a resting state? Yes  Does the patient meet 2 or more of the SIRS criteria? No  MEWS guidelines implemented  Yes, red  Treat  MEWS Interventions Considered administering scheduled or prn medications/treatments as ordered  Take Vital Signs  Increase Vital Sign Frequency  Red: Q1hr x2, continue Q4hrs until patient remains green for 12hrs  Escalate  MEWS: Escalate Red: Discuss with charge nurse and notify provider. Consider notifying RRT. If remains red for 2 hours consider need for higher level of care  Notify: Charge Nurse/RN  Name of Charge Nurse/RN Notified Surveyor, minerals  Provider Notification  Provider Name/Title Dr. Honora  Date Provider Notified 03/02/24  Time Provider Notified 2005  Method of Notification Page  Notification Reason Other (Comment) (RED MEWS HR 153)  Provider response At bedside  Date of Provider Response 03/02/24  Time of Provider Response 2040  Notify: Rapid Response  Name of Rapid Response RN Notified Graeme Roses RN  Date Rapid Response Notified 03/02/24  Time Rapid Response Notified 2015  Assess: SIRS CRITERIA  SIRS Temperature  0  SIRS Respirations  1  SIRS Pulse 1  SIRS WBC 0  SIRS Score Sum  2

## 2024-03-02 NOTE — Interval H&P Note (Signed)
 History and Physical Interval Note:  03/02/2024 2:50 PM  Shannon Scott  has presented today for surgery, with the diagnosis of Anemia.  The various methods of treatment have been discussed with the patient and family. After consideration of risks, benefits and other options for treatment, the patient has consented to  Procedure(s): EGD (ESOPHAGOGASTRODUODENOSCOPY) (N/A) COLONOSCOPY (N/A) as a surgical intervention.  The patient's history has been reviewed, patient examined, no change in status, stable for surgery.  I have reviewed the patient's chart and labs.  Questions were answered to the patient's satisfaction.     [name]

## 2024-03-02 NOTE — Progress Notes (Signed)
 Called to bedside for HR in the 150s. On telemetry review, appears to be a sharp increase in HR without PAC to start. Unclear rhythm: SVT vs flutter. SBP 130s at HR 150.  Lopressor  5 mg IV administered with conversion to SR in the 90s. Repeat EKG pending.   Pt awake and alert, main complaint is back pain.   We will start 12.5 mg lopressor  q6h.   CXR with pulmonary edema. Will give 40 mg IV lasix .   Cycle BP.  OK to proceed to endoscopy.    Jon Nat Hails, PA-C 03/02/2024, 11:12 AM [phone] [facility] [address] [place], [place] [address]

## 2024-03-02 NOTE — Progress Notes (Addendum)
 PROGRESS NOTE  Shannon Scott  FMW:[id number] DOB: 26-Feb-1941 DOA: 02/29/2024 PCP: Shannon Charm, MD   Brief Narrative: Patient is 83 years female with history of hyperlipidemia, endometrial carcinoma, coronary disease status post CABG, paroxysmal A-fib on Eliquis , chronic HFpEF, lymphedema, colostomy, chronic anemia who presented for the evaluation of low hemoglobin as noted on outpatient blood work.  Patient was also having chest pressure triggered by exertion.  She was found to have a hemoglobin of 6.7.  Her stool was also noted to be dark.  On presentation, she was hemodynamically stable.  Lab work showed hemoglobin was 6.8, positive FOBT, troponin of 104.  Cardiology, GI consulted.  Given 2 units of blood transfusion.  Plan for EGD/colonoscopy today.  Assessment & Plan:  Principal Problem:   Symptomatic anemia Active Problems:   Endometrial cancer (HCC)   Hypothyroidism   Paroxysmal atrial fibrillation (HCC)   Coronary artery disease involving native coronary artery of native heart with angina pectoris   Elevated troponin   Chronic heart failure with preserved ejection fraction (HFpEF) (HCC)   GI bleeding  Symptomatic anemia/GI bleed: Presented with hemoglobin of 6.8 dark stool.  Given 2 units of blood transfusion.  Currently hemoglobin of 9.8.  GI already following.  Plan for colonoscopy, EGD today.  Anticoagulation: Eliquis /Plavix  on hold.  Coronary artery disease/troponin elevation: Cardiology consulted and following.  She was following with cardiology as an outpatient and was planned for cardiac cath.  Troponins elevation Suspected to Be from Demand Ischemia.  Cardiology not planning for additional work up  at this time due to her anemia.  Currently recommending to discontinue Plavix  permanently.  Echocardiogram showed EF of 55 to 60%, no wall motion abnormality  History of severe AS: Echocardiogram showed mild aortic valve regurgitation, severe aortic stenosis.   Cardiology following  Paroxysmal A-fib: Monitor on telemetry.  Eliquis  on hold.  Continue Cardizem   Chronic HFpEF: Currently euvolemic.  Hypertension: Currently blood pressure stable.  Monitor  History of endometrial carcinoma: Status post hysterectomy, radiation therapy, chemotherapy.  Follows with Dr. Timmy.  History of sigmoid colon obstruction, status post transverse loop colostomy.  Hypothyroidism: Continue Synthyroid  Hyperlipidemia: Takes Zetia  at home.  Addendum:  She was found to be tachycardic during my evaluation,she was found  in SVT.  Cardiology saw her. Given 5 mg of iv lopressor . Currently in normal sinus rhythm           DVT prophylaxis:SCDs Start: 03/01/24 0416     Code Status: Full Code  Family Communication: Discussed with wife at bedside on 10/3  Patient status:Inpatient  Patient is from :Home  Anticipated discharge un:Ynfz  Estimated DC date:2-3 days   Consultants: Cardiology,GI  Procedures:None  Antimicrobials:  Anti-infectives (From admission, onward)    None       Subjective: Patient seen and examined at bedside today.  Overall comfortable.  Lying on bed.  Found to be tachycardic but denies any chest pain or shortness of breath.   Plan for EGD/colonoscopy today.Daughter at bedside  Objective: Vitals:   03/01/24 2300 03/02/24 0400 03/02/24 0402 03/02/24 0728  BP: 135/60 139/71  (!) 147/73  Pulse: 95 (!) 103  (!) 120  Resp: 20 20  20   Temp: 99.2 F (37.3 C) 99 F (37.2 C)  98 F (36.7 C)  TempSrc: Oral Oral  Oral  SpO2: 91% 93%  94%  Weight:   56.7 kg   Height:        Intake/Output Summary (Last 24 hours) at 03/02/2024 0824 Last  data filed at 03/02/2024 0402 Gross per 24 hour  Intake 1255.11 ml  Output 2880 ml  Net -1624.89 ml   Filed Weights   02/29/24 2125 03/01/24 0548 03/02/24 0402  Weight: 59 kg 57 kg 56.7 kg    Examination:  General exam: Overall comfortable, not in distress HEENT: PERRL Respiratory  system:  no wheezes or crackles  Cardiovascular system: S1 & S2 heard, RRR.  Gastrointestinal system: Abdomen is nondistended, soft and nontender.  Colostomy with liquidy maroon-colored stool Central nervous system: Alert and oriented Extremities: Left lower extremity lymphedema, no clubbing ,no cyanosis Skin: No rashes, no ulcers,no icterus     Data Reviewed: I have personally reviewed following labs and imaging studies  CBC: Recent Labs  Lab 02/29/24 1606 02/29/24 2127 03/01/24 0552 03/01/24 1841 03/02/24 0305  WBC 6.5 6.0 6.6  --  12.1*  NEUTROABS  --  4.3  --   --   --   HGB 6.7* 6.8* 7.5* 9.8* 9.8*  HCT 22.8* 22.9* 23.3* 30.3* 30.9*  MCV 92 92.7 86.6  --  87.0  PLT 327 335 269  --  321   Basic Metabolic Panel: Recent Labs  Lab 02/29/24 1606 02/29/24 2127 03/01/24 0552 03/02/24 0305  NA 141 138 138 140  K 4.0 3.5 3.5 3.9  CL 104 102 106 102  CO2 23 25 25 25   GLUCOSE 106* 129* 94 127*  BUN 17 20 18 14   CREATININE 0.76 0.80 0.82 0.92  CALCIUM 9.0 8.5* 8.2* 8.2*  MG  --   --  1.6*  --      No results found for this or any previous visit (from the past 240 hours).   Radiology Studies: ECHOCARDIOGRAM COMPLETE Result Date: 03/01/2024    ECHOCARDIOGRAM REPORT   Patient Name:   Shannon Scott Date of Exam: 03/01/2024 Medical Rec #:  [id number]         Height:       61.0 in Accession #:    [id number]        Weight:       125.7 lb Date of Birth:  07/18/1940          BSA:          1.550 m Patient Age:    83 years          BP:           116/57 mmHg Patient Gender: F                 HR:           74 bpm. Exam Location:  Inpatient Procedure: 2D Echo, Cardiac Doppler and Color Doppler (Both Spectral and Color            Flow Doppler were utilized during procedure). Indications:    R94.31 Abnormal EKG  History:        Patient has prior history of Echocardiogram examinations, most                 recent 03/09/2023. CHF, CAD, Abnormal ECG and Prior CABG, TIA and                  Stroke, Aortic Valve Disease, Arrythmias:Atrial Fibrillation,                 Signs/Symptoms:Chest Pain and Edema; Risk Factors:Hypertension.                 Aortic stenosis.  Sonographer:    Shannon  [facility] Scott Referring Phys: [id number] Shannon Scott IMPRESSIONS  1. Left ventricular ejection fraction, by estimation, is 55 to 60%. Left ventricular ejection fraction by PLAX is 55 %. The left ventricle has normal function. The left ventricle has no regional wall motion abnormalities. Left ventricular diastolic function could not be evaluated.  2. Right ventricular systolic function is moderately reduced. The right ventricular size is normal. There is moderately elevated pulmonary artery systolic pressure. The estimated right ventricular systolic pressure is 49.0 mmHg.  3. Left atrial size was moderately dilated.  4. The mitral valve is degenerative. Mild to moderate mitral valve regurgitation. Moderate to severe mitral annular calcification.  5. Tricuspid valve regurgitation is mild to moderate.  6. The aortic valve is calcified. Aortic valve regurgitation is mild. Severe aortic valve stenosis. Aortic valve area, by VTI measures 0.75 cm. Aortic valve mean gradient measures 30.0 mmHg. Aortic valve Vmax measures 3.71 m/s. Peak gradient 55.1 mmHg,  DI 0.28.  7. The inferior vena cava is normal in size with <50% respiratory variability, suggesting right atrial pressure of 8 mmHg. Comparison(s): Changes from prior study are noted. 03/09/2023: LVEF 55-60%, moderate AS - MG 20 mmHg. FINDINGS  Left Ventricle: Left ventricular ejection fraction, by estimation, is 55 to 60%. Left ventricular ejection fraction by PLAX is 55 %. The left ventricle has normal function. The left ventricle has no regional wall motion abnormalities. The left ventricular internal cavity size was normal in size. There is no left ventricular hypertrophy. Left ventricular diastolic function could not be evaluated due to mitral annular calcification (moderate  or greater). Left ventricular diastolic function could  not be evaluated. Right Ventricle: The right ventricular size is normal. No increase in right ventricular wall thickness. Right ventricular systolic function is moderately reduced. There is moderately elevated pulmonary artery systolic pressure. The tricuspid regurgitant velocity is 3.20 m/s, and with an assumed right atrial pressure of 8 mmHg, the estimated right ventricular systolic pressure is 49.0 mmHg. Left Atrium: Left atrial size was moderately dilated. Right Atrium: Right atrial size was normal in size. Pericardium: There is no evidence of pericardial effusion. Mitral Valve: The mitral valve is degenerative in appearance. There is moderate calcification of the posterior mitral valve leaflet(s). Moderate to severe mitral annular calcification. Mild to moderate mitral valve regurgitation. MV peak gradient, 14.1 mmHg. The mean mitral valve gradient is 3.0 mmHg. Tricuspid Valve: The tricuspid valve is grossly normal. Tricuspid valve regurgitation is mild to moderate. Aortic Valve: The aortic valve is calcified. Aortic valve regurgitation is mild. Severe aortic stenosis is present. Aortic valve mean gradient measures 30.0 mmHg. Aortic valve peak gradient measures 55.1 mmHg. Aortic valve area, by VTI measures 1.02 cm. Pulmonic Valve: The pulmonic valve was grossly normal. Pulmonic valve regurgitation is trivial. Aorta: The aortic root and ascending aorta are structurally normal, with no evidence of dilitation. Venous: The inferior vena cava is normal in size with less than 50% respiratory variability, suggesting right atrial pressure of 8 mmHg. IAS/Shunts: No atrial level shunt detected by color flow Doppler.  LEFT VENTRICLE PLAX 2D LV EF:         Left            Diastology                ventricular     LV e' medial:  5.33 cm/s                ejection        LV e'  lateral: 8.81 cm/s                fraction by                PLAX is 55                %.  LVIDd:         4.60 cm LVIDs:         3.30 cm LV PW:         1.10 cm LV IVS:        1.00 cm LVOT diam:     1.85 cm LV SV:         83 LV SV Index:   54 LVOT Area:     2.69 cm  LV Volumes (MOD) LV vol d, MOD    101.0 ml A2C: LV vol d, MOD    75.2 ml A4C: LV vol s, MOD    45.9 ml A2C: LV vol s, MOD    37.0 ml A4C: LV SV MOD A2C:   55.1 ml LV SV MOD A4C:   75.2 ml LV SV MOD BP:    46.8 ml RIGHT VENTRICLE            IVC RV S prime:     7.94 cm/s  IVC diam: 1.90 cm TAPSE (M-mode): 1.7 cm LEFT ATRIUM             Index        RIGHT ATRIUM           Index LA diam:        4.50 cm 2.90 cm/m   RA Area:     11.90 cm LA Vol (A2C):   72.0 ml 46.44 ml/m  RA Volume:   23.20 ml  14.97 ml/m LA Vol (A4C):   64.3 ml 41.48 ml/m LA Biplane Vol: 68.3 ml 44.06 ml/m  AORTIC VALVE AV Area (Vmax):    0.99 cm AV Area (Vmean):   0.98 cm AV Area (VTI):     1.02 cm AV Vmax:           371.00 cm/s AV Vmean:          255.000 cm/s AV VTI:            0.814 m AV Peak Grad:      55.1 mmHg AV Mean Grad:      30.0 mmHg LVOT Vmax:         136.00 cm/s LVOT Vmean:        93.000 cm/s LVOT VTI:          0.310 m LVOT/AV VTI ratio: 0.38  AORTA Ao Root diam: 2.70 cm Ao Asc diam:  2.90 cm MITRAL VALVE            TRICUSPID VALVE MV Peak grad: 14.1 mmHg TR Peak grad:   41.0 mmHg MV Mean grad: 3.0 mmHg  TR Vmax:        320.00 cm/s MV Vmax:      1.88 m/s MV Vmean:     77.0 cm/s SHUNTS                         Systemic VTI:  0.31 m                         Systemic Diam: 1.85 cm Vinie Maxcy MD Electronically signed by Vinie Maxcy MD Signature Date/Time: 03/01/2024/4:25:54  PM    Final    DG Chest 2 View Result Date: 02/29/2024 CLINICAL DATA:  Chest pains. EXAM: CHEST - 2 VIEW COMPARISON:  AP portable 03/09/2023. FINDINGS: AP Lat saving Study at 11:15 p.m. Right IJ port catheter again terminates in the distal SVC. There is mild cardiomegaly without evidence for CHF. There are old sternotomy and CABG changes. Mediastinum is stable with aortic calcific plaques  and mild tortuosity. The lungs are clear of infiltrates. There are scattered linear scarring or atelectasis in the lower lung fields. Osteopenia, mild kyphodextroscoliosis and degenerative change thoracic spine. IVC filter. IMPRESSION: 1. No evidence of acute chest disease. 2. Mild cardiomegaly without CHF.  Stable post CABG chest. 3. Aortic atherosclerosis. Electronically Signed   By: Francis Quam M.D.   On: 02/29/2024 23:23    Scheduled Meds:  levothyroxine   50 mcg Oral Daily   pantoprazole  (PROTONIX ) IV  40 mg Intravenous Q12H   sodium chloride  flush  3 mL Intravenous Q12H   Continuous Infusions:  sodium chloride  15 mL/hr at 03/01/24 1830     LOS: 1 day   Ivonne Mustache, MD [facility] P10/07/2023, 8:24 AM

## 2024-03-02 NOTE — Op Note (Signed)
 [facility] Patient Name: Shannon Scott Procedure Date : 03/02/2024 MRN: [id number] Attending MD: Belvie Just , MD, [id number] Date of Birth: 03/03/1941 CSN: [id number] Age: 83 Admit Type: Inpatient Procedure:                Upper GI endoscopy Indications:              Hematochezia, Melena Providers:                Belvie Just, MD, Ritta Debbie Alert, RN,                            Curtistine Bishop, Technician Referring MD:              Medicines:                Propofol  per Anesthesia Complications:            No immediate complications. Estimated Blood Loss:     Estimated blood loss: none. Procedure:                Pre-Anesthesia Assessment:                           - Prior to the procedure, a History and Physical                            was performed, and patient medications and                            allergies were reviewed. The patient's tolerance of                            previous anesthesia was also reviewed. The risks                            and benefits of the procedure and the sedation                            options and risks were discussed with the patient.                            All questions were answered, and informed consent                            was obtained. Prior Anticoagulants: The patient has                            taken no anticoagulant or antiplatelet agents. ASA                            Grade Assessment: III - A patient with severe                            systemic disease. After reviewing the risks and  benefits, the patient was deemed in satisfactory                            condition to undergo the procedure.                           - Sedation was administered by an anesthesia                            professional. Deep sedation was attained.                           After obtaining informed consent, the endoscope was                            passed under direct  vision. Throughout the                            procedure, the patient's blood pressure, pulse, and                            oxygen saturations were monitored continuously. The                            [id number]-HQ190L ([id number]) Olympus colonoscope was                            introduced through the mouth, and advanced to the                            second part of duodenum. The upper GI endoscopy was                            accomplished without difficulty. The patient                            tolerated the procedure well. Scope In: Scope Out: Findings:      A 10 cm hiatal hernia was present.      The stomach was normal.      The examined duodenum was normal. Impression:               - 10 cm hiatal hernia.                           - Normal stomach.                           - Normal examined duodenum.                           - No specimens collected. Recommendation:           - Proceed with the Flex sig. Procedure Code(s):        --- Professional ---  56764, Esophagogastroduodenoscopy, flexible,                            transoral; diagnostic, including collection of                            specimen(s) by brushing or washing, when performed                            (separate procedure) Diagnosis Code(s):        --- Professional ---                           K92.1, Melena (includes Hematochezia)                           K44.9, Diaphragmatic hernia without obstruction or                            gangrene CPT copyright 2022 [employer] Association. All rights reserved. The codes documented in this report are preliminary and upon coder review may  be revised to meet current compliance requirements. Belvie Just, MD Belvie Just, MD 03/02/2024 4:12:49 PM This report has been signed electronically. Number of Addenda: 0

## 2024-03-02 NOTE — Anesthesia Preprocedure Evaluation (Signed)
 Anesthesia Evaluation  Patient identified by MRN, date of birth, ID band Patient awake  General Assessment Comment:  Presented with chest pain, thought to be 2/2 anemia in setting of severe AS. Cardiology deems nothing to do until bleeding issue is addressed by GI.  Reviewed: Allergy & Precautions, NPO status , Patient's Chart, lab work & pertinent test results  History of Anesthesia Complications (+) PONV and history of anesthetic complications  Airway Mallampati: III  TM Distance: >3 FB Neck ROM: Full    Dental  (+) Chipped   Pulmonary asthma , neg sleep apnea, neg COPD, Patient abstained from smoking.Not current smoker   Pulmonary exam normal breath sounds clear to auscultation       Cardiovascular Exercise Tolerance: Good METShypertension, + CAD and + Past MI  (-) dysrhythmias + Valvular Problems/Murmurs AS  Rhythm:Regular Rate:Normal + Systolic murmurs 1. Left ventricular ejection fraction, by estimation, is 55 to 60%. Left  ventricular ejection fraction by PLAX is 55 %. The left ventricle has  normal function. The left ventricle has no regional wall motion  abnormalities. Left ventricular diastolic  function could not be evaluated.   2. Right ventricular systolic function is moderately reduced. The right  ventricular size is normal. There is moderately elevated pulmonary artery  systolic pressure. The estimated right ventricular systolic pressure is  49.0 mmHg.   3. Left atrial size was moderately dilated.   4. The mitral valve is degenerative. Mild to moderate mitral valve  regurgitation. Moderate to severe mitral annular calcification.   5. Tricuspid valve regurgitation is mild to moderate.   6. The aortic valve is calcified. Aortic valve regurgitation is mild.  Severe aortic valve stenosis. Aortic valve area, by VTI measures 0.75 cm.  Aortic valve mean gradient measures 30.0 mmHg. Aortic valve Vmax measures  3.71  m/s. Peak gradient 55.1 mmHg,   DI 0.28.   7. The inferior vena cava is normal in size with <50% respiratory  variability, suggesting right atrial pressure of 8 mmHg.     Neuro/Psych CVA, No Residual Symptoms  negative psych ROS   GI/Hepatic ,GERD  ,,(+)     (-) substance abuse    Endo/Other  neg diabetesHypothyroidism    Renal/GU negative Renal ROS     Musculoskeletal   Abdominal   Peds  Hematology  (+) Blood dyscrasia, anemia   Anesthesia Other Findings Past Medical History: 03/26/2020: A-fib (HCC) 03/26/2020: Acute CVA (cerebrovascular accident) (HCC) No date: Arthritis No date: Asthma     Comment:  allergy induced No date: Bilateral carotid artery disease     Comment:  L carotid bruit No date: Bursitis     Comment:  left hip No date: CAD (coronary artery disease) No date: Cancer (HCC) No date: Dyslipidemia     Comment:  intolerant to statins, welchol, niacin, zetia  10/11/2017: Goals of care, counseling/discussion No date: History of blood transfusion 04/24/2012: History of nuclear stress test     Comment:  lexiscan ; normal study No date: Hypertension No date: Hypothyroidism 10/11/2017: Malignant neoplasm involving 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-direct  metastasis from uterine cervix (HCC) 01/02/2016: Postoperative nausea and vomiting  Reproductive/Obstetrics                              Anesthesia Physical Anesthesia Plan  ASA: 4  Anesthesia Plan: MAC   Post-op Pain Management: Minimal or no pain anticipated   Induction: Intravenous  PONV Risk Score and Plan: 3 and Propofol  infusion,  TIVA, Ondansetron  and Treatment may vary due to age or medical condition  Airway Management Planned: Nasal Cannula  Additional Equipment: ClearSight  Intra-op Plan:   Post-operative Plan:   Informed Consent: I have reviewed the patients History and Physical, chart, labs and discussed the procedure including the risks, benefits and alternatives for the  proposed anesthesia with the patient or authorized representative who has indicated his/her understanding and acceptance.     Dental advisory given  Plan Discussed with: CRNA and Surgeon  Anesthesia Plan Comments: (Discussed risks of anesthesia with patient, including possibility of difficulty with spontaneous ventilation under anesthesia necessitating airway intervention, PONV, and rare risks such as cardiac or respiratory or neurological events, and allergic reactions. Discussed the role of CRNA in patient's perioperative care. Patient understands. Patient counseled on being higher risk for anesthesia due to comorbidities: cardiac ischemia, severe AS. Patient was told about increased risk of cardiac and respiratory events, including death. )        Anesthesia Quick Evaluation

## 2024-03-02 NOTE — Transfer of Care (Signed)
 Immediate Anesthesia Transfer of Care Note  Patient: Shannon Scott  Procedure(s) Performed: EGD (ESOPHAGOGASTRODUODENOSCOPY) COLONOSCOPY  Patient Location: PACU  Anesthesia Type:MAC  Level of Consciousness: drowsy  Airway & Oxygen Therapy: Patient Spontanous Breathing  Post-op Assessment: Report given to RN and Post -op Vital signs reviewed and stable  Post vital signs: Reviewed and stable  Last Vitals:  Vitals Value Taken Time  BP 118/97 03/02/24 16:24  Temp 36.6 C 03/02/24 16:16  Pulse 96 03/02/24 16:31  Resp 27 03/02/24 16:32  SpO2 98 % 03/02/24 16:31  Vitals shown include unfiled device data.  Last Pain:  Vitals:   03/02/24 1620  TempSrc:   PainSc: 0-No pain      Patients Stated Pain Goal: 0 (03/01/24 0551)  Complications: No notable events documented.

## 2024-03-02 NOTE — Significant Event (Signed)
 Rapid Response Event Note   Reason for Call :  SVT-150s accompanied by CP.  Pt had episode of SVT earlier today and was given 5mg  IV metoprolol .   Initial Focused Assessment:  Pt lying in bed with eyes closed, in no visible distress. Pt c/o 6/10 lower chest/upper ABD pain that is located under L breast and radiating down her ABD and around to her back. She says she has had this pain on and off recently. She denies SOB/dizziness. Lungs clear. Heart murmur present. Skin warm and dry.   T-98.2, HR-132, BP-138/65, RR-20, SpO2-92% on 2L [place]  Pt given 5mg  metoprolol  with HF decreasing to 110-120s, BP-130/76.   Pt given additional 2.5mg  metoprolol  with HR decreasing to 90-110s, BP-119/75.  Interventions:  EKG-Afib RVR, Left ventricular hypertrophy ( R in aVL , Sokolow-Lyon , Cornell product ) Inferior-posterior infarct , possibly acute Marked ST abnormality, possible lateral subendocardial injury  ** ** ACUTE MI / STEMI ** ** Consider right ventricular involvement in acute inferior infarct.  5mg  metoprolol  IV x 1 2.5mg  metoprolol  IV x 1  Plan of Care:  Pt feels better after interventions(see cards note for details regarding EKG). Continue to monitor closely Please call RRT if further assistance needeed.   Event Summary:   MD Notified: Dr. Hillary notified and came to bedside.  Call Time:2022 Arrival Time:2028 End Upfz:[id number]  Tish Graeme Piety, RN

## 2024-03-02 NOTE — Progress Notes (Signed)
 Pt has a heart rate of 150 that is sustaining, paged cards Jon Hails, PA and hospitalist Adhikari, MD. Other vital signs were stable. Cards came to bedside and had this nurse administer Metoprolol  5mg  IV and it quickly helped resolve her SVT. Also did an EKG and had the patient hooked up to ZOLL pads in case this nurse had to administer Adenosine. HR went back to NSR.

## 2024-03-02 NOTE — TOC Initial Note (Signed)
 Transition of Care [facility]) - Initial/Assessment Note    Patient Details  Name: Shannon Scott MRN: [id number] Date of Birth: [birth year]  Transition of Care [facility]) CM/SW Contact:    Waddell Barnie Rama, RN Phone Number: 03/02/2024, 12:30 PM  Clinical Narrative:                 From home alone, her daughter will be staying with her for a while after discharge, has PCP and insurance on file, states has no HH services in place at this time , has rollator, leg compression machine, bsc, cane at home.  States family member will transport them home at [employer] and family is support system, states gets medications from [facility] delivery.  Pta self ambulatory with cane sometimes.   ICM will follow for any ICM needs.  Expected Discharge Plan: Home/Self Care Barriers to Discharge: Continued Medical Work up   Patient Goals and CMS Choice Patient states their goals for this hospitalization and ongoing recovery are:: return home   Choice offered to / list presented to : NA      Expected Discharge Plan and Services In-house Referral: NA Discharge Planning Services: CM Consult Post Acute Care Choice: NA Living arrangements for the past 2 months: Single Family Home                 DME Arranged: N/A DME Agency: NA       HH Arranged: NA          Prior Living Arrangements/Services Living arrangements for the past 2 months: Single Family Home Lives with:: Self Patient language and need for interpreter reviewed:: Yes Do you feel safe going back to the place where you live?: Yes      Need for Family Participation in Patient Care: Yes (Comment) Care giver support system in place?: Yes (comment) Current home services: DME (rollator, bsc, cane, leg compression machine) Criminal Activity/Legal Involvement Pertinent to Current Situation/Hospitalization: No - Comment as needed  Activities of Daily Living   ADL Screening (condition at time of admission) Independently performs ADLs?: Yes  (appropriate for developmental age) Is the patient deaf or have difficulty hearing?: Yes Does the patient have difficulty seeing, even when wearing glasses/contacts?: No Does the patient have difficulty concentrating, remembering, or making decisions?: No  Permission Sought/Granted Permission sought to share information with : Case Manager Permission granted to share information with : Yes, Verbal Permission Granted              Emotional Assessment Appearance:: Appears stated age Attitude/Demeanor/Rapport: Engaged Affect (typically observed): Appropriate Orientation: : Oriented to Self, Oriented to Place, Oriented to  Time, Oriented to Situation Alcohol / Substance Use: Not Applicable Psych Involvement: No (comment)  Admission diagnosis:  Elevated troponin [R79.89] Symptomatic anemia [D64.9] Gastrointestinal hemorrhage, unspecified gastrointestinal hemorrhage type [K92.2] Patient Active Problem List   Diagnosis Date Noted   Symptomatic anemia 03/01/2024   Elevated troponin 03/01/2024   Chronic heart failure with preserved ejection fraction (HFpEF) (HCC) 03/01/2024   GI bleeding 03/01/2024   Pernicious anemia 10/06/2023   Periumbilical pain 10/06/2023   History of colonic polyps 10/06/2023   Hematochezia 10/06/2023   Gastrointestinal problem 10/06/2023   Flatulence, eructation and gas pain 10/06/2023   Chronic idiopathic constipation 10/06/2023   Constipation 10/06/2023   Change in bowel habit 10/06/2023   Alkaline phosphatase raised 10/06/2023   Melena 04/01/2023   Statin intolerance 03/12/2023   Coronary artery disease involving native coronary artery of native heart with angina pectoris 03/10/2023  Coronary artery disease involving coronary bypass graft of native heart with angina pectoris 03/10/2023   Pseudoaneurysm following procedure [facility]) -R Radial Artery 03/10/2023   STEMI involving saphenous vein graft to left circumflex coronary artery (HCC) 03/09/2023    Severe aortic stenosis 03/03/2023   Recent non-STEMI 03/01/2023   Edema of left lower extremity 03/02/2022   Dyslipidemia 03/02/2022   Hypothyroidism 03/02/2022   Paroxysmal atrial fibrillation (HCC) 03/02/2022   Chest pain 03/01/2022   Vulvar irritation 01/02/2021   Stroke-like episode    Acute CVA (cerebrovascular accident) (HCC) 03/28/2020   Stroke (HCC) 03/26/2020   Expressive aphasia 03/26/2020   Acute vaginitis 01/03/2020   Acute vulvitis 01/03/2020   Lower extremity edema 11/09/2018   Back pain 11/09/2018   IDA (iron  deficiency anemia) 10/21/2017   Abdominal mass    Deep vein thrombosis (DVT) of left lower extremity (HCC)    Pelvic mass    Acute blood loss anemia    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 (HCC) 10/11/2017   Goals of care, counseling/discussion 10/11/2017   Rectal bleeding 10/03/2017   Normocytic anemia 10/03/2017   Left hip pain 10/03/2017   Generalized weakness 10/03/2017   Colostomy status (HCC) 08/30/2017   Ureteral stent displacement, subsequent encounter 08/30/2017   Hypophosphatemia 08/30/2017   Hypomagnesemia 08/30/2017   Acute blood loss as cause of postoperative anemia 08/30/2017   Chronic anemia 08/30/2017   Left leg swelling 08/30/2017   GERD (gastroesophageal reflux disease) 08/30/2017   Atrial fibrillation, chronic (HCC)    Bowel perforation (HCC) 08/18/2017   Colonic mass 08/18/2017   Ureteral dilatation 08/18/2017   Intractable right heel pain 10/07/2016   Plantar fasciitis of right foot 10/07/2016   Postoperative nausea and vomiting 01/02/2016   Hyponatremia 01/01/2016   Acute hypokalemia 01/01/2016   Endometrial cancer (HCC) 12/30/2015   Familial hypercholesteremia 07/28/2015   S/P CABG x 3 01/13/2013   Palpitations 01/13/2013   Medication intolerance 01/13/2013   Chronic pain 01/13/2013   TIA (transient ischemic attack) 01/13/2013   Carotid stenosis 01/13/2013   Hypothyroid 10/05/2010    Essential hypertension 10/05/2010   Gastroesophagitis 10/05/2010   Atrophic gastritis 10/05/2010   PCP:  Elliot Charm, MD Pharmacy:   [facility] [phone] - [place], [place] - [address] AT [facility] [address] [place] [address] Phone: [phone] Fax: [phone]  [facility] [address] [place] [address] Phone: [phone] Fax: [phone]     Social Drivers of Health (SDOH) Social History: SDOH Screenings   Food Insecurity: No Food Insecurity (03/01/2024)  Housing: Low Risk  (03/01/2024)  Transportation Needs: No Transportation Needs (03/01/2024)  Utilities: Not At Risk (03/01/2024)  Depression (PHQ2-9): Low Risk  (02/23/2024)  Social Connections: Moderately Integrated (03/01/2024)  Tobacco Use: Low Risk  (03/01/2024)   SDOH Interventions:     Readmission Risk Interventions    03/02/2024   12:23 PM  Readmission Risk Prevention Plan  Transportation Screening Complete  PCP or Specialist Appt within 3-5 Days Complete  HRI or Home Care Consult Complete  Palliative Care Screening Not Applicable  Medication Review (RN Care Manager) Complete

## 2024-03-02 NOTE — Progress Notes (Signed)
 Rounding Note   Patient Name: Shannon Scott Date of Encounter: 03/02/2024  [facility] Cardiologist: Vinie JAYSON Maxcy, MD   Subjective - Patient seen by me yesterday for the first time for elevated troponins.  No acute events overnight. - Today the patient reports having improvement of her chest pain, but reports having lower back pain currently. - Earlier on rounds the patient developed sudden onset SVT.  I gave 5 mg IV metoprolol  which broke her SVT. - She has gotten 3 units of packed red blood cells thus far while in the hospital. - Endorses some SOB and back pain but denies chest pain, PND, orthopnea  Scheduled Meds:  sodium chloride    Intravenous Once   adenosine       levothyroxine   50 mcg Oral Daily   pantoprazole  (PROTONIX ) IV  40 mg Intravenous Q12H   sodium chloride  flush  3 mL Intravenous Q12H   Continuous Infusions:  sodium chloride  Stopped (03/02/24 1147)   PRN Meds: acetaminophen  **OR** acetaminophen , adenosine, ondansetron  **OR** ondansetron  (ZOFRAN ) IV, traMADol    Vital Signs  Vitals:   03/02/24 1100 03/02/24 1106 03/02/24 1110 03/02/24 1130  BP: (!) 149/103 (!) 143/71 (!) 142/78 (!) 130/96  Pulse: (!) 142 95 94 (!) 104  Resp: (!) 34 20 (!) 30 (!) 32  Temp:      TempSrc:      SpO2: 94% 94% 92% 96%  Weight:      Height:        Intake/Output Summary (Last 24 hours) at 03/02/2024 1309 Last data filed at 03/02/2024 0402 Gross per 24 hour  Intake 1245.11 ml  Output 2850 ml  Net -1604.89 ml      03/02/2024    4:02 AM 03/01/2024    5:48 AM 02/29/2024    9:25 PM  Last 3 Weights  Weight (lbs) 125 lb 125 lb 10.6 oz 130 lb  Weight (kg) 56.7 kg 57 kg 58.968 kg      Telemetry SVT now with NSR since converting with IV metoprolol - Personally Reviewed  ECG  NSR with ST depressions in precordial leads which are improving- Personally Reviewed  Physical Exam  GEN: Laying in bed in mild discomfort but in NAD Neck: No JVD Cardiac: Tachycardic  with regular rhythm, harsh II/VI systolic murmur heard at the RUSB, no rubs or gallops Respiratory: Rales heard in bilateral lung fields, faint end expiratory wheeze heard throughout, no rhonchi GI: Soft, nontender, non-distended, ostomy bag with melenic stools MS: No edema; No deformity. Neuro:  Nonfocal  Psych: Normal affect   Labs High Sensitivity Troponin:   Recent Labs  Lab 03/01/24 0006 03/01/24 0552 03/01/24 1001 03/01/24 1135  TROPONINIHS 104* 91* 142* 308*     Chemistry Recent Labs  Lab 02/29/24 2127 03/01/24 0552 03/02/24 0305  NA 138 138 140  K 3.5 3.5 3.9  CL 102 106 102  CO2 25 25 25   GLUCOSE 129* 94 127*  BUN 20 18 14   CREATININE 0.80 0.82 0.92  CALCIUM 8.5* 8.2* 8.2*  MG  --  1.6*  --   PROT 5.4*  --   --   ALBUMIN 2.9*  --   --   AST 16  --   --   ALT 11  --   --   ALKPHOS 52  --   --   BILITOT 0.3  --   --   GFRNONAA >60 >60 >60  ANIONGAP 11 7 13     Lipids No results for input(s): CHOL,  TRIG, HDL, LABVLDL, LDLCALC, CHOLHDL in the last 168 hours.  Hematology Recent Labs  Lab 02/29/24 2127 03/01/24 0552 03/01/24 1135 03/01/24 1841 03/02/24 0305 03/02/24 1155  WBC 6.0 6.6  --   --  12.1*  --   RBC 2.47* 2.69* 2.80*  --  3.55*  --   HGB 6.8* 7.5*  --  9.8* 9.8* 10.5*  HCT 22.9* 23.3*  --  30.3* 30.9* 32.5*  MCV 92.7 86.6  --   --  87.0  --   MCH 27.5 27.9  --   --  27.6  --   MCHC 29.7* 32.2  --   --  31.7  --   RDW 17.9* 17.1*  --   --  16.9*  --   PLT 335 269  --   --  321  --    Thyroid  No results for input(s): TSH, FREET4 in the last 168 hours.  BNP Recent Labs  Lab 03/01/24 1135  BNP 653.3*    DDimer No results for input(s): DDIMER in the last 168 hours.   Radiology  DG CHEST PORT 1 VIEW Result Date: 03/02/2024 CLINICAL DATA:  Pulmonary edema. EXAM: PORTABLE CHEST 1 VIEW COMPARISON:  February 29, 2024. FINDINGS: Stable cardiomediastinal silhouette. Mildly increased interstitial densities are noted bilaterally  concerning for pulmonary edema. Small pleural effusions are noted. Status post coronary artery bypass graft. Right internal jugular Port-A-Cath is unchanged. IMPRESSION: Mildly increased interstitial densities are noted bilaterally concerning for pulmonary edema. Small pleural effusions are noted. Electronically Signed   By: Lynwood Landy Raddle M.D.   On: 03/02/2024 10:30   ECHOCARDIOGRAM COMPLETE Result Date: 03/01/2024    ECHOCARDIOGRAM REPORT   Patient Name:   Shannon Scott Date of Exam: 03/01/2024 Medical Rec #:  [id number]         Height:       61.0 in Accession #:    [id number]        Weight:       125.7 lb Date of Birth:  10/21/1940          BSA:          1.550 m Patient Age:    83 years          BP:           116/57 mmHg Patient Gender: F                 HR:           74 bpm. Exam Location:  Inpatient Procedure: 2D Echo, Cardiac Doppler and Color Doppler (Both Spectral and Color            Flow Doppler were utilized during procedure). Indications:    R94.31 Abnormal EKG  History:        Patient has prior history of Echocardiogram examinations, most                 recent 03/09/2023. CHF, CAD, Abnormal ECG and Prior CABG, TIA and                 Stroke, Aortic Valve Disease, Arrythmias:Atrial Fibrillation,                 Signs/Symptoms:Chest Pain and Edema; Risk Factors:Hypertension.                 Aortic stenosis.  Sonographer:    Ellouise Mose RDCS Referring Phys: [id number] TAYLOR A PARCELLS IMPRESSIONS  1. Left ventricular ejection fraction, by estimation,  is 55 to 60%. Left ventricular ejection fraction by PLAX is 55 %. The left ventricle has normal function. The left ventricle has no regional wall motion abnormalities. Left ventricular diastolic function could not be evaluated.  2. Right ventricular systolic function is moderately reduced. The right ventricular size is normal. There is moderately elevated pulmonary artery systolic pressure. The estimated right ventricular systolic pressure is 49.0 mmHg.  3.  Left atrial size was moderately dilated.  4. The mitral valve is degenerative. Mild to moderate mitral valve regurgitation. Moderate to severe mitral annular calcification.  5. Tricuspid valve regurgitation is mild to moderate.  6. The aortic valve is calcified. Aortic valve regurgitation is mild. Severe aortic valve stenosis. Aortic valve area, by VTI measures 0.75 cm. Aortic valve mean gradient measures 30.0 mmHg. Aortic valve Vmax measures 3.71 m/s. Peak gradient 55.1 mmHg,  DI 0.28.  7. The inferior vena cava is normal in size with <50% respiratory variability, suggesting right atrial pressure of 8 mmHg. Comparison(s): Changes from prior study are noted. 03/09/2023: LVEF 55-60%, moderate AS - MG 20 mmHg. FINDINGS  Left Ventricle: Left ventricular ejection fraction, by estimation, is 55 to 60%. Left ventricular ejection fraction by PLAX is 55 %. The left ventricle has normal function. The left ventricle has no regional wall motion abnormalities. The left ventricular internal cavity size was normal in size. There is no left ventricular hypertrophy. Left ventricular diastolic function could not be evaluated due to mitral annular calcification (moderate or greater). Left ventricular diastolic function could  not be evaluated. Right Ventricle: The right ventricular size is normal. No increase in right ventricular wall thickness. Right ventricular systolic function is moderately reduced. There is moderately elevated pulmonary artery systolic pressure. The tricuspid regurgitant velocity is 3.20 m/s, and with an assumed right atrial pressure of 8 mmHg, the estimated right ventricular systolic pressure is 49.0 mmHg. Left Atrium: Left atrial size was moderately dilated. Right Atrium: Right atrial size was normal in size. Pericardium: There is no evidence of pericardial effusion. Mitral Valve: The mitral valve is degenerative in appearance. There is moderate calcification of the posterior mitral valve leaflet(s). Moderate  to severe mitral annular calcification. Mild to moderate mitral valve regurgitation. MV peak gradient, 14.1 mmHg. The mean mitral valve gradient is 3.0 mmHg. Tricuspid Valve: The tricuspid valve is grossly normal. Tricuspid valve regurgitation is mild to moderate. Aortic Valve: The aortic valve is calcified. Aortic valve regurgitation is mild. Severe aortic stenosis is present. Aortic valve mean gradient measures 30.0 mmHg. Aortic valve peak gradient measures 55.1 mmHg. Aortic valve area, by VTI measures 1.02 cm. Pulmonic Valve: The pulmonic valve was grossly normal. Pulmonic valve regurgitation is trivial. Aorta: The aortic root and ascending aorta are structurally normal, with no evidence of dilitation. Venous: The inferior vena cava is normal in size with less than 50% respiratory variability, suggesting right atrial pressure of 8 mmHg. IAS/Shunts: No atrial level shunt detected by color flow Doppler.  LEFT VENTRICLE PLAX 2D LV EF:         Left            Diastology                ventricular     LV e' medial:  5.33 cm/s                ejection        LV e' lateral: 8.81 cm/s  fraction by                PLAX is 55                %. LVIDd:         4.60 cm LVIDs:         3.30 cm LV PW:         1.10 cm LV IVS:        1.00 cm LVOT diam:     1.85 cm LV SV:         83 LV SV Index:   54 LVOT Area:     2.69 cm  LV Volumes (MOD) LV vol d, MOD    101.0 ml A2C: LV vol d, MOD    75.2 ml A4C: LV vol s, MOD    45.9 ml A2C: LV vol s, MOD    37.0 ml A4C: LV SV MOD A2C:   55.1 ml LV SV MOD A4C:   75.2 ml LV SV MOD BP:    46.8 ml RIGHT VENTRICLE            IVC RV S prime:     7.94 cm/s  IVC diam: 1.90 cm TAPSE (M-mode): 1.7 cm LEFT ATRIUM             Index        RIGHT ATRIUM           Index LA diam:        4.50 cm 2.90 cm/m   RA Area:     11.90 cm LA Vol (A2C):   72.0 ml 46.44 ml/m  RA Volume:   23.20 ml  14.97 ml/m LA Vol (A4C):   64.3 ml 41.48 ml/m LA Biplane Vol: 68.3 ml 44.06 ml/m  AORTIC VALVE AV Area  (Vmax):    0.99 cm AV Area (Vmean):   0.98 cm AV Area (VTI):     1.02 cm AV Vmax:           371.00 cm/s AV Vmean:          255.000 cm/s AV VTI:            0.814 m AV Peak Grad:      55.1 mmHg AV Mean Grad:      30.0 mmHg LVOT Vmax:         136.00 cm/s LVOT Vmean:        93.000 cm/s LVOT VTI:          0.310 m LVOT/AV VTI ratio: 0.38  AORTA Ao Root diam: 2.70 cm Ao Asc diam:  2.90 cm MITRAL VALVE            TRICUSPID VALVE MV Peak grad: 14.1 mmHg TR Peak grad:   41.0 mmHg MV Mean grad: 3.0 mmHg  TR Vmax:        320.00 cm/s MV Vmax:      1.88 m/s MV Vmean:     77.0 cm/s SHUNTS                         Systemic VTI:  0.31 m                         Systemic Diam: 1.85 cm Vinie Maxcy MD Electronically signed by Vinie Maxcy MD Signature Date/Time: 03/01/2024/4:25:54 PM    Final    DG Chest 2 View Result Date: 02/29/2024 CLINICAL DATA:  Chest pains. EXAM: CHEST - 2 VIEW COMPARISON:  AP portable 03/09/2023. FINDINGS: AP Lat saving Study at 11:15 p.m. Right IJ port catheter again terminates in the distal SVC. There is mild cardiomegaly without evidence for CHF. There are old sternotomy and CABG changes. Mediastinum is stable with aortic calcific plaques and mild tortuosity. The lungs are clear of infiltrates. There are scattered linear scarring or atelectasis in the lower lung fields. Osteopenia, mild kyphodextroscoliosis and degenerative change thoracic spine. IVC filter. IMPRESSION: 1. No evidence of acute chest disease. 2. Mild cardiomegaly without CHF.  Stable post CABG chest. 3. Aortic atherosclerosis. Electronically Signed   By: Francis Quam M.D.   On: 02/29/2024 23:23    Cardiac Studies  TTE 03/02/24:  IMPRESSIONS     1. Left ventricular ejection fraction, by estimation, is 55 to 60%. Left  ventricular ejection fraction by PLAX is 55 %. The left ventricle has  normal function. The left ventricle has no regional wall motion  abnormalities. Left ventricular diastolic  function could not be  evaluated.   2. Right ventricular systolic function is moderately reduced. The right  ventricular size is normal. There is moderately elevated pulmonary artery  systolic pressure. The estimated right ventricular systolic pressure is  49.0 mmHg.   3. Left atrial size was moderately dilated.   4. The mitral valve is degenerative. Mild to moderate mitral valve  regurgitation. Moderate to severe mitral annular calcification.   5. Tricuspid valve regurgitation is mild to moderate.   6. The aortic valve is calcified. Aortic valve regurgitation is mild.  Severe aortic valve stenosis. Aortic valve area, by VTI measures 0.75 cm.  Aortic valve mean gradient measures 30.0 mmHg. Aortic valve Vmax measures  3.71 m/s. Peak gradient 55.1 mmHg,   DI 0.28.   7. The inferior vena cava is normal in size with <50% respiratory  variability, suggesting right atrial pressure of 8 mmHg.    Patient Profile   Shannon Scott is a 83 y.o. female with a hx of CAD s/p CABG x 3 with LIMA-LAD, SVG-OM, SVG-RCA in 2005 and s/p DES to SVG-OM 2 in 03/2023, severe aortic stenosis, locally advanced endometrial cancer s/p chemoradiation, immunotherapy, hysterectomy with BSO, colonic mass with partial colectomy and colostomy, prior DVT, paroxysmal A-fib on eliquis , carotid artery stenosis, CVA, HTN, HLD, statin intolerance, lymphedema, asthma, hypothyroidism who is being seen 03/01/2024 for the evaluation of chest pain, elevated troponins at the request of Dr Darci   Assessment & Plan   #NSTEMI, Type II #CAD s/p CABG :: Chest pain is dramatically improved since undergoing multiple blood transfusions.  Complete echo yesterday demonstrated preserved LV systolic function without wall motion abnormalities.  All of this suggest that her elevated troponin and ischemia were driven by her underlying anemia as suspected.  I still recommend that she undergo treatment by GI for her anemia and we hold off on pursuing catheterization  unless her angina persist despite complete resolution of her anemia and treatment by GI. - I do not recommend LHC at this time given that this is likely demand ischemia in the setting of profound anemia - Please avoid anticoagulation at this time given bleeding - DO NOT give nitroglycerin  to this patient given that she has severe AS as this could be life-threatening - Hold home Plavix  and consider discontinuing indefinitely   #Acute Blood Loss Anemia :: Still has evidence of bleeding in her ostomy.  GI was consulted and plans to scope today. -Follow-up GI recs -s/p 3  units PRBCs - Hold Eliquis  5 mg twice daily - Given clinical stability will not reverse with Andexxa at this time -Continue with IV PPI twice daily - Trend H&H every 6 hours  #New SVT :: Developed sudden onset narrow complex tachycardia on 10/3 that broke with 5 mg IV metoprolol  x1.  She is currently strict n.p.o. for GI bleed, but following clearing p.o. will start metoprolol  to tartrate. - Maintain telemetry - Plan to start metoprolol  tartrate once able to take p.o.  #HFpEF #Pulmonary Edema :: Has developed some pulmonary edema on CXR in the setting of multiple blood transfusions.  She has underlying HFpEF so this is not entirely surprising.  She got 40 mg IV Lasix  yesterday and I will give her another 40 mg IV Lasix  today. -Give 40 mg IV Lasix  -Strict I's and O's   #Severe AS :: Known severe AS.  Critical that she maintains adequate blood volume.  Will get a complete echo. - Avoid nitroglycerin    #PAF - Holding Eliquis  as above - Holding diltiazem  given softer blood pressures   #HLD - Continue Zetia  - Hold Repatha  inpatient       For questions or updates, please contact [facility] Please consult [email or web address] for contact info under       Signed, Georganna Archer, MD  03/02/2024, 1:09 PM

## 2024-03-02 NOTE — Op Note (Signed)
 [facility] Patient Name: Shannon Scott Procedure Date : 03/02/2024 MRN: [id number] Attending MD: Belvie Just , MD, [id number] Date of Birth: 1940-11-14 CSN: [id number] Age: 83 Admit Type: Inpatient Procedure:                Colonoscopy Indications:              Hematochezia, Melena Providers:                Belvie Just, MD, Ritta Debbie Alert, RN,                            Curtistine Bishop, Technician Referring MD:              Medicines:                Propofol  per Anesthesia Complications:            No immediate complications. Estimated Blood Loss:     Estimated blood loss: none. Procedure:                Pre-Anesthesia Assessment:                           - Prior to the procedure, a History and Physical                            was performed, and patient medications and                            allergies were reviewed. The patient's tolerance of                            previous anesthesia was also reviewed. The risks                            and benefits of the procedure and the sedation                            options and risks were discussed with the patient.                            All questions were answered, and informed consent                            was obtained. Prior Anticoagulants: The patient has                            taken no anticoagulant or antiplatelet agents. ASA                            Grade Assessment: III - A patient with severe                            systemic disease. After reviewing the risks and  benefits, the patient was deemed in satisfactory                            condition to undergo the procedure.                           - Sedation was administered by an anesthesia                            professional. Deep sedation was attained.                           After obtaining informed consent, the colonoscope                            was passed under direct vision.  Throughout the                            procedure, the patient's blood pressure, pulse, and                            oxygen saturations were monitored continuously. The                            [id number]-HQ190L ([id number] Olympus colonoscope was                            introduced through the transverse colostomy and                            advanced to the the cecum, identified by                            appendiceal orifice and ileocecal valve. The                            quality of the bowel preparation was good. The                            ileocecal valve, appendiceal orifice, and rectum                            were photographed. The colonoscopy was technically                            difficult and complex. The patient tolerated the                            procedure well. Scope In: 3:31:39 PM Scope Out: 3:58:51 PM Scope Withdrawal Time: 0 hours 12 minutes 50 seconds  Total Procedure Duration: 0 hours 27 minutes 12 seconds  Findings:      A diffuse area of severely erythematous mucosa was found in the distal       transverse colon. Biopsies were taken with a cold forceps for histology.  The colonoscope was changed out for the adult upper endoscope. With my       index finger I palpated the left and right portions of the ostomy to       help understand the anatomic path of the lumen and to help guide the       endoscope. Digitally guiding the scope to the right side, the endoscope       was advanced proximally. A column of water  was used to open the lumen       and the endoscope gentley glided towards the cecum. In the cecum there       were some erythematous stripes and cold biopsies were obtained. The       endoscope was able to intubate the TI and it was noted to be normal.       There was no evidence of bleeding on this side of the colon. Again, with       digital guidance, the endoscope was advanced into the left portion of       the colon. There was a  severe diffuse inflammation. At first it appeared       similar to barotrauma, but with further advancement it was clear that       this was a diversion colitis. Remnant left sided mucus stool balls were       noted in this area. The endoscope was not advanced beyond the descending       colon. Cold biopsies were obtained in this region. Impression:               - Erythematous mucosa in the distal transverse                            colon. Biopsied. Recommendation:           - Return patient to hospital ward for ongoing care.                           - Resume regular diet.                           - Continue present medications.                           - Await pathology results.                           - Mesalamine enema through the left side of the                            ostomy. The method of admistration was discussed                            with the floor nurse. Procedure Code(s):        --- Professional ---                           [phone], Colonoscopy through stoma; with biopsy,                            single or multiple Diagnosis Code(s):        ---  Professional ---                           K63.89, Other specified diseases of intestine                           K92.1, Melena (includes Hematochezia) CPT copyright 2022 [employer]. All rights reserved. The codes documented in this report are preliminary and upon coder review may  be revised to meet current compliance requirements. Belvie Just, MD Belvie Just, MD 03/02/2024 4:35:27 PM This report has been signed electronically. Number of Addenda: 0

## 2024-03-03 ENCOUNTER — Other Ambulatory Visit: Payer: Self-pay

## 2024-03-03 DIAGNOSIS — D649 Anemia, unspecified: Secondary | ICD-10-CM | POA: Diagnosis not present

## 2024-03-03 LAB — BASIC METABOLIC PANEL WITH GFR
Anion gap: 8 (ref 5–15)
BUN: 16 mg/dL (ref 8–23)
CO2: 27 mmol/L (ref 22–32)
Calcium: 7.9 mg/dL — ABNORMAL LOW (ref 8.9–10.3)
Chloride: 104 mmol/L (ref 98–111)
Creatinine, Ser: 0.93 mg/dL (ref 0.44–1.00)
GFR, Estimated: >60 mL/min (ref >60)
Glucose, Bld: 118 mg/dL — ABNORMAL HIGH (ref 70–99)
Potassium: 3.2 mmol/L — ABNORMAL LOW (ref 3.5–5.1)
Sodium: 139 mmol/L (ref 135–145)

## 2024-03-03 LAB — CBC
HCT: 30.2 % — ABNORMAL LOW (ref 36.0–46.0)
Hemoglobin: 9.6 g/dL — ABNORMAL LOW (ref 12.0–15.0)
MCH: 27.8 pg (ref 26.0–34.0)
MCHC: 31.8 g/dL (ref 30.0–36.0)
MCV: 87.5 fL (ref 80.0–100.0)
Platelets: 317 K/uL (ref 150–400)
RBC: 3.45 MIL/uL — ABNORMAL LOW (ref 3.87–5.11)
RDW: 17 % — ABNORMAL HIGH (ref 11.5–15.5)
WBC: 10.7 K/uL — ABNORMAL HIGH (ref 4.0–10.5)
nRBC: 0 % (ref 0.0–0.2)

## 2024-03-03 MED ORDER — AMIODARONE HCL IN DEXTROSE 360-4.14 MG/200ML-% IV SOLN
30.0000 mg/h | INTRAVENOUS | Status: DC
  Administered 2024-03-03 – 2024-03-04 (×2): 30 mg/h via INTRAVENOUS
  Filled 2024-03-03: qty 200

## 2024-03-03 MED ORDER — FUROSEMIDE 10 MG/ML IJ SOLN
40.0000 mg | Freq: Once | INTRAMUSCULAR | Status: AC
  Administered 2024-03-03: 40 mg via INTRAVENOUS
  Filled 2024-03-03: qty 4

## 2024-03-03 MED ORDER — POTASSIUM CHLORIDE CRYS ER 20 MEQ PO TBCR
40.0000 meq | EXTENDED_RELEASE_TABLET | Freq: Once | ORAL | Status: DC
Start: 2024-03-03 — End: 2024-03-03
  Filled 2024-03-03: qty 2

## 2024-03-03 MED ORDER — METOPROLOL TARTRATE 25 MG PO TABS
25.0000 mg | ORAL_TABLET | Freq: Four times a day (QID) | ORAL | Status: DC
  Administered 2024-03-03 – 2024-03-04 (×4): 25 mg via ORAL
  Filled 2024-03-03 (×4): qty 1

## 2024-03-03 MED ORDER — POTASSIUM CHLORIDE 20 MEQ PO PACK
40.0000 meq | PACK | Freq: Once | ORAL | Status: AC
  Administered 2024-03-03: 40 meq via ORAL
  Filled 2024-03-03: qty 2

## 2024-03-03 MED ORDER — TRIAMCINOLONE ACETONIDE 0.1 % EX CREA
TOPICAL_CREAM | Freq: Every day | CUTANEOUS | Status: DC
  Administered 2024-03-04: 2 via TOPICAL
  Administered 2024-03-08: 1 via TOPICAL
  Filled 2024-03-03 (×4): qty 15

## 2024-03-03 MED ORDER — AMIODARONE HCL IN DEXTROSE 360-4.14 MG/200ML-% IV SOLN
60.0000 mg/h | INTRAVENOUS | Status: DC
  Administered 2024-03-03 (×2): 60 mg/h via INTRAVENOUS
  Filled 2024-03-03 (×3): qty 200

## 2024-03-03 MED ORDER — AMIODARONE LOAD VIA INFUSION
150.0000 mg | Freq: Once | INTRAVENOUS | Status: AC
  Administered 2024-03-03: 150 mg via INTRAVENOUS
  Filled 2024-03-03: qty 83.34

## 2024-03-03 NOTE — Progress Notes (Signed)
 Subjective: No complaints.  Objective: Vital signs in last 24 hours: Temp:  [97.2 F (36.2 C)-98.8 F (37.1 C)] 98.2 F (36.8 C) (10/04 0403) Pulse Rate:  [84-155] 84 (10/04 0650) Resp:  [20-34] 20 (10/04 0650) BP: (100-155)/(56-103) 134/64 (10/04 0650) SpO2:  [91 %-99 %] 95 % (10/04 0650) Weight:  [58.7 kg] 58.7 kg (10/04 0500) Last BM Date : 03/02/24  Intake/Output from previous day: 10/03 0701 - 10/04 0700 In: 420 [P.O.:120; I.V.:300] Out: 900 [Urine:900] Intake/Output this shift: No intake/output data recorded.  General appearance: alert and no distress GI: transverse loop ostomy with some bloody discharge  Lab Results: Recent Labs    02/29/24 2127 03/01/24 0552 03/01/24 1841 03/02/24 0305 03/02/24 1155 03/02/24 1742  WBC 6.0 6.6  --  12.1*  --   --   HGB 6.8* 7.5*   < > 9.8* 10.5* 10.7*  HCT 22.9* 23.3*   < > 30.9* 32.5* 34.2*  PLT 335 269  --  321  --   --    < > = values in this interval not displayed.   BMET Recent Labs    02/29/24 2127 03/01/24 0552 03/02/24 0305  NA 138 138 140  K 3.5 3.5 3.9  CL 102 106 102  CO2 25 25 25   GLUCOSE 129* 94 127*  BUN 20 18 14   CREATININE 0.80 0.82 0.92  CALCIUM 8.5* 8.2* 8.2*   LFT Recent Labs    02/29/24 2127  PROT 5.4*  ALBUMIN 2.9*  AST 16  ALT 11  ALKPHOS 52  BILITOT 0.3   PT/INR Recent Labs    03/01/24 0006  LABPROT 21.4*  INR 1.8*   Hepatitis Panel No results for input(s): HEPBSAG, HCVAB, HEPAIGM, HEPBIGM in the last 72 hours. C-Diff No results for input(s): CDIFFTOX in the last 72 hours. Fecal Lactopherrin No results for input(s): FECLLACTOFRN in the last 72 hours.  Studies/Results: DG CHEST PORT 1 VIEW Result Date: 03/02/2024 CLINICAL DATA:  Pulmonary edema. EXAM: PORTABLE CHEST 1 VIEW COMPARISON:  February 29, 2024. FINDINGS: Stable cardiomediastinal silhouette. Mildly increased interstitial densities are noted bilaterally concerning for pulmonary edema. Small pleural  effusions are noted. Status post coronary artery bypass graft. Right internal jugular Port-A-Cath is unchanged. IMPRESSION: Mildly increased interstitial densities are noted bilaterally concerning for pulmonary edema. Small pleural effusions are noted. Electronically Signed   By: Lynwood Landy Raddle M.D.   On: 03/02/2024 10:30   ECHOCARDIOGRAM COMPLETE Result Date: 03/01/2024    ECHOCARDIOGRAM REPORT   Patient Name:   Shannon Scott Date of Exam: 03/01/2024 Medical Rec #:  [id number]         Height:       61.0 in Accession #:    [id number]        Weight:       125.7 lb Date of Birth:  1941-01-03          BSA:          1.550 m Patient Age:    83 years          BP:           116/57 mmHg Patient Gender: F                 HR:           74 bpm. Exam Location:  Inpatient Procedure: 2D Echo, Cardiac Doppler and Color Doppler (Both Spectral and Color            Flow Doppler were  utilized during procedure). Indications:    R94.31 Abnormal EKG  History:        Patient has prior history of Echocardiogram examinations, most                 recent 03/09/2023. CHF, CAD, Abnormal ECG and Prior CABG, TIA and                 Stroke, Aortic Valve Disease, Arrythmias:Atrial Fibrillation,                 Signs/Symptoms:Chest Pain and Edema; Risk Factors:Hypertension.                 Aortic stenosis.  Sonographer:    Ellouise Mose RDCS Referring Phys: [id number] TAYLOR A PARCELLS IMPRESSIONS  1. Left ventricular ejection fraction, by estimation, is 55 to 60%. Left ventricular ejection fraction by PLAX is 55 %. The left ventricle has normal function. The left ventricle has no regional wall motion abnormalities. Left ventricular diastolic function could not be evaluated.  2. Right ventricular systolic function is moderately reduced. The right ventricular size is normal. There is moderately elevated pulmonary artery systolic pressure. The estimated right ventricular systolic pressure is 49.0 mmHg.  3. Left atrial size was moderately dilated.  4.  The mitral valve is degenerative. Mild to moderate mitral valve regurgitation. Moderate to severe mitral annular calcification.  5. Tricuspid valve regurgitation is mild to moderate.  6. The aortic valve is calcified. Aortic valve regurgitation is mild. Severe aortic valve stenosis. Aortic valve area, by VTI measures 0.75 cm. Aortic valve mean gradient measures 30.0 mmHg. Aortic valve Vmax measures 3.71 m/s. Peak gradient 55.1 mmHg,  DI 0.28.  7. The inferior vena cava is normal in size with <50% respiratory variability, suggesting right atrial pressure of 8 mmHg. Comparison(s): Changes from prior study are noted. 03/09/2023: LVEF 55-60%, moderate AS - MG 20 mmHg. FINDINGS  Left Ventricle: Left ventricular ejection fraction, by estimation, is 55 to 60%. Left ventricular ejection fraction by PLAX is 55 %. The left ventricle has normal function. The left ventricle has no regional wall motion abnormalities. The left ventricular internal cavity size was normal in size. There is no left ventricular hypertrophy. Left ventricular diastolic function could not be evaluated due to mitral annular calcification (moderate or greater). Left ventricular diastolic function could  not be evaluated. Right Ventricle: The right ventricular size is normal. No increase in right ventricular wall thickness. Right ventricular systolic function is moderately reduced. There is moderately elevated pulmonary artery systolic pressure. The tricuspid regurgitant velocity is 3.20 m/s, and with an assumed right atrial pressure of 8 mmHg, the estimated right ventricular systolic pressure is 49.0 mmHg. Left Atrium: Left atrial size was moderately dilated. Right Atrium: Right atrial size was normal in size. Pericardium: There is no evidence of pericardial effusion. Mitral Valve: The mitral valve is degenerative in appearance. There is moderate calcification of the posterior mitral valve leaflet(s). Moderate to severe mitral annular calcification. Mild  to moderate mitral valve regurgitation. MV peak gradient, 14.1 mmHg. The mean mitral valve gradient is 3.0 mmHg. Tricuspid Valve: The tricuspid valve is grossly normal. Tricuspid valve regurgitation is mild to moderate. Aortic Valve: The aortic valve is calcified. Aortic valve regurgitation is mild. Severe aortic stenosis is present. Aortic valve mean gradient measures 30.0 mmHg. Aortic valve peak gradient measures 55.1 mmHg. Aortic valve area, by VTI measures 1.02 cm. Pulmonic Valve: The pulmonic valve was grossly normal. Pulmonic valve regurgitation is trivial. Aorta: The aortic  root and ascending aorta are structurally normal, with no evidence of dilitation. Venous: The inferior vena cava is normal in size with less than 50% respiratory variability, suggesting right atrial pressure of 8 mmHg. IAS/Shunts: No atrial level shunt detected by color flow Doppler.  LEFT VENTRICLE PLAX 2D LV EF:         Left            Diastology                ventricular     LV e' medial:  5.33 cm/s                ejection        LV e' lateral: 8.81 cm/s                fraction by                PLAX is 55                %. LVIDd:         4.60 cm LVIDs:         3.30 cm LV PW:         1.10 cm LV IVS:        1.00 cm LVOT diam:     1.85 cm LV SV:         83 LV SV Index:   54 LVOT Area:     2.69 cm  LV Volumes (MOD) LV vol d, MOD    101.0 ml A2C: LV vol d, MOD    75.2 ml A4C: LV vol s, MOD    45.9 ml A2C: LV vol s, MOD    37.0 ml A4C: LV SV MOD A2C:   55.1 ml LV SV MOD A4C:   75.2 ml LV SV MOD BP:    46.8 ml RIGHT VENTRICLE            IVC RV S prime:     7.94 cm/s  IVC diam: 1.90 cm TAPSE (M-mode): 1.7 cm LEFT ATRIUM             Index        RIGHT ATRIUM           Index LA diam:        4.50 cm 2.90 cm/m   RA Area:     11.90 cm LA Vol (A2C):   72.0 ml 46.44 ml/m  RA Volume:   23.20 ml  14.97 ml/m LA Vol (A4C):   64.3 ml 41.48 ml/m LA Biplane Vol: 68.3 ml 44.06 ml/m  AORTIC VALVE AV Area (Vmax):    0.99 cm AV Area (Vmean):   0.98  cm AV Area (VTI):     1.02 cm AV Vmax:           371.00 cm/s AV Vmean:          255.000 cm/s AV VTI:            0.814 m AV Peak Grad:      55.1 mmHg AV Mean Grad:      30.0 mmHg LVOT Vmax:         136.00 cm/s LVOT Vmean:        93.000 cm/s LVOT VTI:          0.310 m LVOT/AV VTI ratio: 0.38  AORTA Ao Root diam: 2.70 cm Ao Asc diam:  2.90 cm MITRAL VALVE  TRICUSPID VALVE MV Peak grad: 14.1 mmHg TR Peak grad:   41.0 mmHg MV Mean grad: 3.0 mmHg  TR Vmax:        320.00 cm/s MV Vmax:      1.88 m/s MV Vmean:     77.0 cm/s SHUNTS                         Systemic VTI:  0.31 m                         Systemic Diam: 1.85 cm Vinie Maxcy MD Electronically signed by Vinie Maxcy MD Signature Date/Time: 03/01/2024/4:25:54 PM    Final     Medications: Scheduled:  sodium chloride    Intravenous Once   levothyroxine   50 mcg Oral Daily   mesalamine  4 g Rectal QHS   metoprolol  tartrate       metoprolol  tartrate       metoprolol  tartrate       metoprolol  tartrate  12.5 mg Oral Q6H   pantoprazole  (PROTONIX ) IV  40 mg Intravenous Q12H   sodium chloride  flush  3 mL Intravenous Q12H   Continuous:  amiodarone  60 mg/hr (03/03/24 0711)   Followed by   amiodarone       Assessment/Plan: 1) 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 2) Anemia - stable. 3) SVT. 4) Severe aortic stenosis.   The patient did not receive the mesalamine enemas as she was experiencing SVT.  The staff did not want to cause any undue stress with administering the enema.  She is well this morning and her HGB is stable.  The patient and her family were appraised of the situation and they are ready to have the enema.  The enema, as discussed with nursing this AM, is to be administered to the left soft of the patient's ostomy.  The best course of action would be to reverse the loop colostomy, but that is not a possibility with her cardiac condition.  SCFA enemas are the recommendation, but the main pharmacy is not able to obtain the medication.   Hopefully the mesalamine enemas will help.  Plan: 1) Mesalamine enema once per day.  Administer now and at bedtime for today. 2) Follow HGB and transfuse as necessary.   LOS: 2 days   Soyla Bainter D 03/03/2024, 7:17 AM

## 2024-03-03 NOTE — Progress Notes (Signed)
 Pt had another episode of SVT, HR sustained in the 150. EKG showed sinus tachycardia. Dr. Hillary notified.   03/03/24 0403  Vitals  Temp 98.2 F (36.8 C)  Temp Source Oral  BP 119/66  MAP (mmHg) 81  BP Location Right Arm  BP Method Automatic  Patient Position (if appropriate) Lying  Pulse Rate 84  Pulse Rate Source Monitor  ECG Heart Rate 85  Resp 20  MEWS COLOR  MEWS Score Color Green  Oxygen Therapy  SpO2 91 %  O2 Device Nasal Cannula  O2 Flow Rate (L/min) 2 L/min  Pain Assessment  Pain Scale 0-10  Pain Score 0  MEWS Score  MEWS Temp 0  MEWS Systolic 0  MEWS Pulse 0  MEWS RR 0  MEWS LOC 0  MEWS Score 0  Provider Notification  Provider Name/Title Dr. Hillary  Date Provider Notified 03/03/24  Time Provider Notified 0403  Method of Notification Page  Notification Reason Other (Comment) (SVT HR 150)  Provider response Evaluate remotely  Date of Provider Response 03/03/24  Time of Provider Response [phone]

## 2024-03-03 NOTE — Progress Notes (Signed)
 PT Cancellation Note  Patient Details Name: Shannon Scott MRN: [id number] DOB: [birth year]   Cancelled Treatment:    Reason Eval/Treat Not Completed: Patient declined, no  Patient initially agreeable to therapy evaluation. Having abdominal cramping. Enema earlier. She provided hx, PLOF, and home set-up (well equipped.) Became very agitated with request for functional testing and refused to work with therapy at this time. Will attempt again tomorrow if willing. Nursing states pt was able to sit EOB earlier today.  Hopeful function is preserved - will update dispo/equipment recs once she is agreeable to participate.    Leontine Roads, PT, DPT [facility] Physical Therapist Office: [phone] Website: [email or web address]   Leontine GORMAN Roads 03/03/2024, 3:42 PM

## 2024-03-03 NOTE — Progress Notes (Signed)
 Enema administered via ostomy left stoma entrance per directions by MD Rollin.   Patients daughter at bedside per patient request as daughter assists patient with ostomy care and if enemas are required at home, patient's daughter will be assisting.  RN placed finger in stoma as previously instructed by MD Rollin and gently rotated finger to find entrance to colon then gently inserted enema.   RN noticed some blood in discharge and enema appeared to mostly be leaking out of stoma vs being retained.  MD Rollin notified and MD Rollin came to bedside to assist in enema administration and teaching patient's daughter.  MD Rollin administered remaining enema and advised it is normal for enema to leak out of stoma vs fully being administered into patient.  MD Rollin educated patient's daughter and assisted patient's daughter in inserting her finger in stoma and feeling in stoma for entrance to colon if future enema administrations will be required at home.  Patient normally uses a one piece ostomy pouch.  Patient and RN discussed that a two piece ostomy pouch may be best while enemas are ordered due to requiring the removal of the pouch to administer.  WOC RN consulted and two piece ostomy supplies ordered for patient.

## 2024-03-03 NOTE — Progress Notes (Signed)
 Rounding Note   Patient Name: Shannon Scott Date of Encounter: 03/03/2024  [facility] Cardiologist: Vinie JAYSON Maxcy, MD   Subjective Shannon Scott is a 83 y.o. female with a hx of CAD s/p CABG x 3 with LIMA-LAD, SVG-OM, SVG-RCA in 2005 and s/p DES to SVG-OM 2 in 03/2023, severe aortic stenosis, locally advanced endometrial cancer s/p chemoradiation, immunotherapy, hysterectomy with BSO, colonic mass with partial colectomy and colostomy, prior DVT, paroxysmal A-fib on eliquis , carotid artery stenosis, CVA, HTN, HLD, statin intolerance, lymphedema, asthma, hypothyroidism and cardiology was consulted for the evaluation of chest pain, elevated troponins at the request of Dr Darci.  Doing well today. Had an episode of SVT this morning on telemetry  Scheduled Meds:  sodium chloride    Intravenous Once   levothyroxine   50 mcg Oral Daily   mesalamine  4 g Rectal QHS   metoprolol  tartrate  12.5 mg Oral Q6H   pantoprazole  (PROTONIX ) IV  40 mg Intravenous Q12H   sodium chloride  flush  3 mL Intravenous Q12H   triamcinolone  cream   Topical Daily   Continuous Infusions:  amiodarone  60 mg/hr (03/03/24 1011)   Followed by   amiodarone      PRN Meds: acetaminophen  **OR** acetaminophen , ondansetron  **OR** ondansetron  (ZOFRAN ) IV, traMADol    Vital Signs  Vitals:   03/03/24 0650 03/03/24 0710 03/03/24 0753 03/03/24 0800  BP: 134/64 129/72 119/69 127/66  Pulse: 84 79  77  Resp: 20 20  (!) 31  Temp:   98.3 F (36.8 C)   TempSrc:   Oral   SpO2: 95% 92%  94%  Weight:      Height:        Intake/Output Summary (Last 24 hours) at 03/03/2024 1038 Last data filed at 03/03/2024 0800 Gross per 24 hour  Intake 493.01 ml  Output 900 ml  Net -406.99 ml      03/03/2024    5:00 AM 03/02/2024    4:02 AM 03/01/2024    5:48 AM  Last 3 Weights  Weight (lbs) 129 lb 6.6 oz 125 lb 125 lb 10.6 oz  Weight (kg) 58.7 kg 56.7 kg 57 kg      Telemetry SVT this AM now with NSR - Personally  Reviewed  ECG  NSR with ST depressions in precordial leads which are improving- Personally Reviewed   Physical Exam Vitals and nursing note reviewed.  Constitutional:      Appearance: Normal appearance.  HENT:     Head: Normocephalic and atraumatic.  Eyes:     Conjunctiva/sclera: Conjunctivae normal.  Neck:     Vascular: No carotid bruit.  Cardiovascular:     Rate and Rhythm: Normal rate and regular rhythm.     Heart sounds: Murmur heard.     Systolic murmur is present with a grade of 4/6.  Pulmonary:     Effort: Pulmonary effort is normal.     Breath sounds: Normal breath sounds.  Musculoskeletal:        General: No swelling or tenderness.  Skin:    Coloration: Skin is not jaundiced or pale.  Neurological:     Mental Status: She is alert.      Labs High Sensitivity Troponin:   Recent Labs  Lab 03/01/24 0006 03/01/24 0552 03/01/24 1001 03/01/24 1135  TROPONINIHS 104* 91* 142* 308*     Chemistry Recent Labs  Lab 02/29/24 2127 03/01/24 0552 03/02/24 0305  NA 138 138 140  K 3.5 3.5 3.9  CL 102 106 102  CO2 25 25 25   GLUCOSE 129* 94 127*  BUN 20 18 14   CREATININE 0.80 0.82 0.92  CALCIUM 8.5* 8.2* 8.2*  MG  --  1.6*  --   PROT 5.4*  --   --   ALBUMIN 2.9*  --   --   AST 16  --   --   ALT 11  --   --   ALKPHOS 52  --   --   BILITOT 0.3  --   --   GFRNONAA >60 >60 >60  ANIONGAP 11 7 13     Lipids No results for input(s): CHOL, TRIG, HDL, LABVLDL, LDLCALC, CHOLHDL in the last 168 hours.  Hematology Recent Labs  Lab 02/29/24 2127 03/01/24 0552 03/01/24 1135 03/01/24 1841 03/02/24 0305 03/02/24 1155 03/02/24 1742  WBC 6.0 6.6  --   --  12.1*  --   --   RBC 2.47* 2.69* 2.80*  --  3.55*  --   --   HGB 6.8* 7.5*  --    < > 9.8* 10.5* 10.7*  HCT 22.9* 23.3*  --    < > 30.9* 32.5* 34.2*  MCV 92.7 86.6  --   --  87.0  --   --   MCH 27.5 27.9  --   --  27.6  --   --   MCHC 29.7* 32.2  --   --  31.7  --   --   RDW 17.9* 17.1*  --   --   16.9*  --   --   PLT 335 269  --   --  321  --   --    < > = values in this interval not displayed.   Thyroid  No results for input(s): TSH, FREET4 in the last 168 hours.  BNP Recent Labs  Lab 03/01/24 1135  BNP 653.3*    DDimer No results for input(s): DDIMER in the last 168 hours.   Radiology  DG CHEST PORT 1 VIEW Result Date: 03/02/2024 CLINICAL DATA:  Pulmonary edema. EXAM: PORTABLE CHEST 1 VIEW COMPARISON:  February 29, 2024. FINDINGS: Stable cardiomediastinal silhouette. Mildly increased interstitial densities are noted bilaterally concerning for pulmonary edema. Small pleural effusions are noted. Status post coronary artery bypass graft. Right internal jugular Port-A-Cath is unchanged. IMPRESSION: Mildly increased interstitial densities are noted bilaterally concerning for pulmonary edema. Small pleural effusions are noted. Electronically Signed   By: Lynwood Landy Raddle M.D.   On: 03/02/2024 10:30   ECHOCARDIOGRAM COMPLETE Result Date: 03/01/2024    ECHOCARDIOGRAM REPORT   Patient Name:   Shannon Scott Date of Exam: 03/01/2024 Medical Rec #:  [id number]         Height:       61.0 in Accession #:    [id number]        Weight:       125.7 lb Date of Birth:  Oct 08, 1940          BSA:          1.550 m Patient Age:    83 years          BP:           116/57 mmHg Patient Gender: F                 HR:           74 bpm. Exam Location:  Inpatient Procedure: 2D Echo, Cardiac Doppler and Color Doppler (Both Spectral and Color  Flow Doppler were utilized during procedure). Indications:    R94.31 Abnormal EKG  History:        Patient has prior history of Echocardiogram examinations, most                 recent 03/09/2023. CHF, CAD, Abnormal ECG and Prior CABG, TIA and                 Stroke, Aortic Valve Disease, Arrythmias:Atrial Fibrillation,                 Signs/Symptoms:Chest Pain and Edema; Risk Factors:Hypertension.                 Aortic stenosis.  Sonographer:    Ellouise Mose RDCS Referring  Phys: [id number] TAYLOR A PARCELLS IMPRESSIONS  1. Left ventricular ejection fraction, by estimation, is 55 to 60%. Left ventricular ejection fraction by PLAX is 55 %. The left ventricle has normal function. The left ventricle has no regional wall motion abnormalities. Left ventricular diastolic function could not be evaluated.  2. Right ventricular systolic function is moderately reduced. The right ventricular size is normal. There is moderately elevated pulmonary artery systolic pressure. The estimated right ventricular systolic pressure is 49.0 mmHg.  3. Left atrial size was moderately dilated.  4. The mitral valve is degenerative. Mild to moderate mitral valve regurgitation. Moderate to severe mitral annular calcification.  5. Tricuspid valve regurgitation is mild to moderate.  6. The aortic valve is calcified. Aortic valve regurgitation is mild. Severe aortic valve stenosis. Aortic valve area, by VTI measures 0.75 cm. Aortic valve mean gradient measures 30.0 mmHg. Aortic valve Vmax measures 3.71 m/s. Peak gradient 55.1 mmHg,  DI 0.28.  7. The inferior vena cava is normal in size with <50% respiratory variability, suggesting right atrial pressure of 8 mmHg. Comparison(s): Changes from prior study are noted. 03/09/2023: LVEF 55-60%, moderate AS - MG 20 mmHg. FINDINGS  Left Ventricle: Left ventricular ejection fraction, by estimation, is 55 to 60%. Left ventricular ejection fraction by PLAX is 55 %. The left ventricle has normal function. The left ventricle has no regional wall motion abnormalities. The left ventricular internal cavity size was normal in size. There is no left ventricular hypertrophy. Left ventricular diastolic function could not be evaluated due to mitral annular calcification (moderate or greater). Left ventricular diastolic function could  not be evaluated. Right Ventricle: The right ventricular size is normal. No increase in right ventricular wall thickness. Right ventricular systolic function is  moderately reduced. There is moderately elevated pulmonary artery systolic pressure. The tricuspid regurgitant velocity is 3.20 m/s, and with an assumed right atrial pressure of 8 mmHg, the estimated right ventricular systolic pressure is 49.0 mmHg. Left Atrium: Left atrial size was moderately dilated. Right Atrium: Right atrial size was normal in size. Pericardium: There is no evidence of pericardial effusion. Mitral Valve: The mitral valve is degenerative in appearance. There is moderate calcification of the posterior mitral valve leaflet(s). Moderate to severe mitral annular calcification. Mild to moderate mitral valve regurgitation. MV peak gradient, 14.1 mmHg. The mean mitral valve gradient is 3.0 mmHg. Tricuspid Valve: The tricuspid valve is grossly normal. Tricuspid valve regurgitation is mild to moderate. Aortic Valve: The aortic valve is calcified. Aortic valve regurgitation is mild. Severe aortic stenosis is present. Aortic valve mean gradient measures 30.0 mmHg. Aortic valve peak gradient measures 55.1 mmHg. Aortic valve area, by VTI measures 1.02 cm. Pulmonic Valve: The pulmonic valve was grossly normal. Pulmonic valve regurgitation is trivial.  Aorta: The aortic root and ascending aorta are structurally normal, with no evidence of dilitation. Venous: The inferior vena cava is normal in size with less than 50% respiratory variability, suggesting right atrial pressure of 8 mmHg. IAS/Shunts: No atrial level shunt detected by color flow Doppler.  LEFT VENTRICLE PLAX 2D LV EF:         Left            Diastology                ventricular     LV e' medial:  5.33 cm/s                ejection        LV e' lateral: 8.81 cm/s                fraction by                PLAX is 55                %. LVIDd:         4.60 cm LVIDs:         3.30 cm LV PW:         1.10 cm LV IVS:        1.00 cm LVOT diam:     1.85 cm LV SV:         83 LV SV Index:   54 LVOT Area:     2.69 cm  LV Volumes (MOD) LV vol d, MOD    101.0 ml  A2C: LV vol d, MOD    75.2 ml A4C: LV vol s, MOD    45.9 ml A2C: LV vol s, MOD    37.0 ml A4C: LV SV MOD A2C:   55.1 ml LV SV MOD A4C:   75.2 ml LV SV MOD BP:    46.8 ml RIGHT VENTRICLE            IVC RV S prime:     7.94 cm/s  IVC diam: 1.90 cm TAPSE (M-mode): 1.7 cm LEFT ATRIUM             Index        RIGHT ATRIUM           Index LA diam:        4.50 cm 2.90 cm/m   RA Area:     11.90 cm LA Vol (A2C):   72.0 ml 46.44 ml/m  RA Volume:   23.20 ml  14.97 ml/m LA Vol (A4C):   64.3 ml 41.48 ml/m LA Biplane Vol: 68.3 ml 44.06 ml/m  AORTIC VALVE AV Area (Vmax):    0.99 cm AV Area (Vmean):   0.98 cm AV Area (VTI):     1.02 cm AV Vmax:           371.00 cm/s AV Vmean:          255.000 cm/s AV VTI:            0.814 m AV Peak Grad:      55.1 mmHg AV Mean Grad:      30.0 mmHg LVOT Vmax:         136.00 cm/s LVOT Vmean:        93.000 cm/s LVOT VTI:          0.310 m LVOT/AV VTI ratio: 0.38  AORTA Ao Root diam: 2.70 cm Ao Asc diam:  2.90 cm MITRAL VALVE  TRICUSPID VALVE MV Peak grad: 14.1 mmHg TR Peak grad:   41.0 mmHg MV Mean grad: 3.0 mmHg  TR Vmax:        320.00 cm/s MV Vmax:      1.88 m/s MV Vmean:     77.0 cm/s SHUNTS                         Systemic VTI:  0.31 m                         Systemic Diam: 1.85 cm Vinie Maxcy MD Electronically signed by Vinie Maxcy MD Signature Date/Time: 03/01/2024/4:25:54 PM    Final     Cardiac Studies  TTE 03/02/24:  IMPRESSIONS     1. Left ventricular ejection fraction, by estimation, is 55 to 60%. Left  ventricular ejection fraction by PLAX is 55 %. The left ventricle has  normal function. The left ventricle has no regional wall motion  abnormalities. Left ventricular diastolic  function could not be evaluated.   2. Right ventricular systolic function is moderately reduced. The right  ventricular size is normal. There is moderately elevated pulmonary artery  systolic pressure. The estimated right ventricular systolic pressure is  49.0 mmHg.   3. Left  atrial size was moderately dilated.   4. The mitral valve is degenerative. Mild to moderate mitral valve  regurgitation. Moderate to severe mitral annular calcification.   5. Tricuspid valve regurgitation is mild to moderate.   6. The aortic valve is calcified. Aortic valve regurgitation is mild.  Severe aortic valve stenosis. Aortic valve area, by VTI measures 0.75 cm.  Aortic valve mean gradient measures 30.0 mmHg. Aortic valve Vmax measures  3.71 m/s. Peak gradient 55.1 mmHg,   DI 0.28.   7. The inferior vena cava is normal in size with <50% respiratory  variability, suggesting right atrial pressure of 8 mmHg.    Assessment & Plan   #NSTEMI, Type II #CAD s/p CABG Chest pain improved following blood transfusions - I do not recommend LHC at this time given that this is likely demand ischemia in the setting of profound anemia - Please avoid anticoagulation at this time given bleeding - DO NOT give nitroglycerin  to this patient given that she has severe AS as this could be life-threatening - Hold home Plavix  and consider discontinuing indefinitely   #Acute Blood Loss Anemia with probable diversion colitis GI planning on mesalamine enemas daily - Follow-up GI recs - Hold Eliquis  5 mg twice daily  #New SVT :: Developed sudden onset narrow complex tachycardia on 10/3 that broke with 5 mg IV metoprolol  x1.   - Maintain telemetry - Increase Lopressor  to 25 mg Q6h and amiodarone  infusion  #HFpEF #Pulmonary Edema :: Has developed some pulmonary edema on CXR in the setting of multiple blood transfusions.  She has underlying HFpEF so this is not entirely surprising.  Weight increased from yesterday by 4 lb - Continue gentle diuresis given severe AS  - Strict I's and O's   #Severe AS :: Known severe AS.  Critical that she maintains adequate blood volume.  Will get a complete echo. - Avoid nitroglycerin    #PAF - Holding Eliquis  as above - Holding diltiazem  given softer blood  pressures   #HLD - Continue Zetia  - Hold Repatha  inpatient       For questions or updates, please contact [facility] Please consult [email or web address] for contact info under  Signed, Emeline FORBES Calender, MD  03/03/2024, 10:38 AM

## 2024-03-03 NOTE — Consult Note (Addendum)
 WOC Nurse Consult Note: patient with longstanding history of lymphedema, has been followed by PT and wound care center for ongoing management; last seen at [facility] 09/2023 with ulcerations to legs r/t lymphedema and using alginate dressing  Reason for Consult: L leg redness and swelling  Wound type:  partial thickness skin loss r/t lymphedema  Pressure Injury POA:NA  Measurement: see nursing flowsheet  Wound bed: pink moist  Drainage (amount, consistency, odor) weeping serous fluid  Periwound: edema and erythema, changes consistent with lymphedema  Dressing procedure/placement/frequency: Cleanse L lower leg wounds with NS, apply calcium alginate to any weeping wound beds daily.  Apply Triamcinolone  to remaining intact skin.  Cover with ABD pad and secure with Kerlix roll gauze beginning right above toes and ending right below knee.  Apply Ace bandage in same fashion as kerlix for light compression.  If leg is not weeping can just apply Triamcinolone  and leave open to air.    POC discussed with bedside nurse. WOC team will not follow. Re-consult if further needs arise.   WOC team consulted for ostomy.  Patient with diverting loop colostomy placed by Dr. Sheldon 07/2017.  Patient educated after surgery and daughter assists with care per bedside nurse.    Last notes on ostomy were 70 mm x 45 mm oval and flush with skin.  Patient appears to use a Convatec pouch at home which is not on formulary at [facility]. Equivalent product would be 2 piece 2 3/4 convex Skin barrier Gerlean #[id number] and 2 3/4 pouch Gerlean #2, 2 barrier ring Gerlean [phone].   WOC team will not follow this established ostomy unless issues with leaking or difficulty with pouching system. Reconsult if needed.    Thank you,    Powell Bar MSN, RN-BC, [employer]

## 2024-03-03 NOTE — Progress Notes (Signed)
 PROGRESS NOTE  Shannon Scott  FMW:[id number] DOB: August 19, 1940 DOA: 02/29/2024 PCP: Elliot Charm, MD   Brief Narrative: Patient is 83 years female with history of hyperlipidemia, endometrial carcinoma, coronary disease status post CABG, paroxysmal A-fib on Eliquis , chronic HFpEF, lymphedema, colostomy, chronic anemia who presented for the evaluation of low hemoglobin as noted on outpatient blood work.  Patient was also having chest pressure triggered by exertion.  She was found to have a hemoglobin of 6.7.  Her stool was also noted to be dark.  On presentation, she was hemodynamically stable.  Lab work showed hemoglobin was 6.8, positive FOBT, troponin of 104.  Cardiology, GI consulted.  Given 2 units of blood transfusion. S/P EGD/colonoscopy on 10/3.  Hospital course also remarkable for debridement of A-fib with RVR, started on amiodarone  drip.  This morning she is in normal sinus rhythm.  Assessment & Plan:  Principal Problem:   Symptomatic anemia Active Problems:   Endometrial cancer (HCC)   Hypothyroidism   Paroxysmal atrial fibrillation (HCC)   Coronary artery disease involving native coronary artery of native heart with angina pectoris   Elevated troponin   Chronic heart failure with preserved ejection fraction (HFpEF) (HCC)   GI bleeding  Symptomatic anemia/GI bleed: Presented with hemoglobin of 6.8 dark stool.  Given 2 units of blood transfusion.  Currently hemoglobin of 10. S/P  colonoscopy, EGD on 10/3 which showed 10 cm hiatal hernia, normal stomach/duodenum, erythematous mucosa in distal transverse colon, biopsy taken.  Started on mesalamine daily by GI.Eliquis /Plavix  on hold.  Coronary artery disease/troponin elevation: Cardiology consulted and following.  She was following with cardiology as an outpatient and was planned for cardiac cath.  Troponins elevation Suspected to Be from Demand Ischemia.  Cardiology not planning for additional work up  at this time due to her  anemia.  Currently recommending to discontinue Plavix  permanently.  Echocardiogram showed EF of 55 to 60%, no wall motion abnormality  History of severe AS: Echocardiogram showed mild aortic valve regurgitation, severe aortic stenosis.  Cardiology following.  Currently being given IV Lasix   Paroxysmal A-fib: Monitor on telemetry.  Eliquis  on hold.  Went into A-fib with RVR on 10/3, started on amiodarone  drip.  Also on oral metoprolol .  Remains in normal sinus rhythm this morning  Chronic HFpEF: Being given IV Lasix  today  Hypertension: Currently blood pressure stable.  Monitor  History of endometrial carcinoma: Status post hysterectomy, radiation therapy, chemotherapy.  Follows with Dr. Timmy.  History of sigmoid colon obstruction, status post transverse loop colostomy.  Hypothyroidism: Continue Synthyroid  Hyperlipidemia: Takes Zetia  at home.  Debility/deconditioning: Will consult PT         DVT prophylaxis:SCDs Start: 03/01/24 0416     Code Status: Full Code  Family Communication: Discussed with daughter  at bedside on 10/4  Patient status:Inpatient  Patient is from :Home  Anticipated discharge un:Ynfz vs SnF  Estimated DC date:2-3 days   Consultants: Cardiology,GI  Procedures:EGD/colonoscopy  Antimicrobials:  Anti-infectives (From admission, onward)    None       Subjective: Patient seen and examined at bedside today.  Remains in normal sinus rhythm this morning.  Last night she went into A-fib with RVR and was given IV amiodarone .  Feels better today.  Denies shortness of breath or cough.  On 2 L of oxygen per minute for comfort.  Objective: Vitals:   03/03/24 0650 03/03/24 0710 03/03/24 0753 03/03/24 0800  BP: 134/64 129/72 119/69 127/66  Pulse: 84 79  77  Resp: 20  20  (!) 31  Temp:   98.3 F (36.8 C)   TempSrc:   Oral   SpO2: 95% 92%  94%  Weight:      Height:        Intake/Output Summary (Last 24 hours) at 03/03/2024 1107 Last data filed at  03/03/2024 0800 Gross per 24 hour  Intake 493.01 ml  Output 900 ml  Net -406.99 ml   Filed Weights   03/01/24 0548 03/02/24 0402 03/03/24 0500  Weight: 57 kg 56.7 kg 58.7 kg    Examination:   General exam: Overall comfortable, not in distress HEENT: PERRL Respiratory system:  no wheezes or crackles but diminished sounds at bases Cardiovascular system: S1 & S2 heard, RRR.  Gastrointestinal system: Abdomen is nondistended, soft and nontender.  Colostomy with liquidy maroon-colored stool Central nervous system: Alert and oriented Extremities: Left lower extremity lymphedema, no clubbing ,no cyanosis Skin: No rashes, no ulcers,no icterus      Data Reviewed: I have personally reviewed following labs and imaging studies  CBC: Recent Labs  Lab 02/29/24 1606 02/29/24 2127 02/29/24 2127 03/01/24 0552 03/01/24 1841 03/02/24 0305 03/02/24 1155 03/02/24 1742  WBC 6.5 6.0  --  6.6  --  12.1*  --   --   NEUTROABS  --  4.3  --   --   --   --   --   --   HGB 6.7* 6.8*   < > 7.5* 9.8* 9.8* 10.5* 10.7*  HCT 22.8* 22.9*   < > 23.3* 30.3* 30.9* 32.5* 34.2*  MCV 92 92.7  --  86.6  --  87.0  --   --   PLT 327 335  --  269  --  321  --   --    < > = values in this interval not displayed.   Basic Metabolic Panel: Recent Labs  Lab 02/29/24 1606 02/29/24 2127 03/01/24 0552 03/02/24 0305  NA 141 138 138 140  K 4.0 3.5 3.5 3.9  CL 104 102 106 102  CO2 23 25 25 25   GLUCOSE 106* 129* 94 127*  BUN 17 20 18 14   CREATININE 0.76 0.80 0.82 0.92  CALCIUM 9.0 8.5* 8.2* 8.2*  MG  --   --  1.6*  --      No results found for this or any previous visit (from the past 240 hours).   Radiology Studies: DG CHEST PORT 1 VIEW Result Date: 03/02/2024 CLINICAL DATA:  Pulmonary edema. EXAM: PORTABLE CHEST 1 VIEW COMPARISON:  February 29, 2024. FINDINGS: Stable cardiomediastinal silhouette. Mildly increased interstitial densities are noted bilaterally concerning for pulmonary edema. Small pleural  effusions are noted. Status post coronary artery bypass graft. Right internal jugular Port-A-Cath is unchanged. IMPRESSION: Mildly increased interstitial densities are noted bilaterally concerning for pulmonary edema. Small pleural effusions are noted. Electronically Signed   By: Lynwood Landy Raddle M.D.   On: 03/02/2024 10:30   ECHOCARDIOGRAM COMPLETE Result Date: 03/01/2024    ECHOCARDIOGRAM REPORT   Patient Name:   Shannon Scott Date of Exam: 03/01/2024 Medical Rec #:  [id number]         Height:       61.0 in Accession #:    [id number]        Weight:       125.7 lb Date of Birth:  1941/03/07          BSA:          1.550 m Patient Age:  83 years          BP:           116/57 mmHg Patient Gender: F                 HR:           74 bpm. Exam Location:  Inpatient Procedure: 2D Echo, Cardiac Doppler and Color Doppler (Both Spectral and Color            Flow Doppler were utilized during procedure). Indications:    R94.31 Abnormal EKG  History:        Patient has prior history of Echocardiogram examinations, most                 recent 03/09/2023. CHF, CAD, Abnormal ECG and Prior CABG, TIA and                 Stroke, Aortic Valve Disease, Arrythmias:Atrial Fibrillation,                 Signs/Symptoms:Chest Pain and Edema; Risk Factors:Hypertension.                 Aortic stenosis.  Sonographer:    Ellouise Mose RDCS Referring Phys: [id number] TAYLOR A PARCELLS IMPRESSIONS  1. Left ventricular ejection fraction, by estimation, is 55 to 60%. Left ventricular ejection fraction by PLAX is 55 %. The left ventricle has normal function. The left ventricle has no regional wall motion abnormalities. Left ventricular diastolic function could not be evaluated.  2. Right ventricular systolic function is moderately reduced. The right ventricular size is normal. There is moderately elevated pulmonary artery systolic pressure. The estimated right ventricular systolic pressure is 49.0 mmHg.  3. Left atrial size was moderately dilated.  4.  The mitral valve is degenerative. Mild to moderate mitral valve regurgitation. Moderate to severe mitral annular calcification.  5. Tricuspid valve regurgitation is mild to moderate.  6. The aortic valve is calcified. Aortic valve regurgitation is mild. Severe aortic valve stenosis. Aortic valve area, by VTI measures 0.75 cm. Aortic valve mean gradient measures 30.0 mmHg. Aortic valve Vmax measures 3.71 m/s. Peak gradient 55.1 mmHg,  DI 0.28.  7. The inferior vena cava is normal in size with <50% respiratory variability, suggesting right atrial pressure of 8 mmHg. Comparison(s): Changes from prior study are noted. 03/09/2023: LVEF 55-60%, moderate AS - MG 20 mmHg. FINDINGS  Left Ventricle: Left ventricular ejection fraction, by estimation, is 55 to 60%. Left ventricular ejection fraction by PLAX is 55 %. The left ventricle has normal function. The left ventricle has no regional wall motion abnormalities. The left ventricular internal cavity size was normal in size. There is no left ventricular hypertrophy. Left ventricular diastolic function could not be evaluated due to mitral annular calcification (moderate or greater). Left ventricular diastolic function could  not be evaluated. Right Ventricle: The right ventricular size is normal. No increase in right ventricular wall thickness. Right ventricular systolic function is moderately reduced. There is moderately elevated pulmonary artery systolic pressure. The tricuspid regurgitant velocity is 3.20 m/s, and with an assumed right atrial pressure of 8 mmHg, the estimated right ventricular systolic pressure is 49.0 mmHg. Left Atrium: Left atrial size was moderately dilated. Right Atrium: Right atrial size was normal in size. Pericardium: There is no evidence of pericardial effusion. Mitral Valve: The mitral valve is degenerative in appearance. There is moderate calcification of the posterior mitral valve leaflet(s). Moderate to severe mitral annular calcification. Mild  to moderate  mitral valve regurgitation. MV peak gradient, 14.1 mmHg. The mean mitral valve gradient is 3.0 mmHg. Tricuspid Valve: The tricuspid valve is grossly normal. Tricuspid valve regurgitation is mild to moderate. Aortic Valve: The aortic valve is calcified. Aortic valve regurgitation is mild. Severe aortic stenosis is present. Aortic valve mean gradient measures 30.0 mmHg. Aortic valve peak gradient measures 55.1 mmHg. Aortic valve area, by VTI measures 1.02 cm. Pulmonic Valve: The pulmonic valve was grossly normal. Pulmonic valve regurgitation is trivial. Aorta: The aortic root and ascending aorta are structurally normal, with no evidence of dilitation. Venous: The inferior vena cava is normal in size with less than 50% respiratory variability, suggesting right atrial pressure of 8 mmHg. IAS/Shunts: No atrial level shunt detected by color flow Doppler.  LEFT VENTRICLE PLAX 2D LV EF:         Left            Diastology                ventricular     LV e' medial:  5.33 cm/s                ejection        LV e' lateral: 8.81 cm/s                fraction by                PLAX is 55                %. LVIDd:         4.60 cm LVIDs:         3.30 cm LV PW:         1.10 cm LV IVS:        1.00 cm LVOT diam:     1.85 cm LV SV:         83 LV SV Index:   54 LVOT Area:     2.69 cm  LV Volumes (MOD) LV vol d, MOD    101.0 ml A2C: LV vol d, MOD    75.2 ml A4C: LV vol s, MOD    45.9 ml A2C: LV vol s, MOD    37.0 ml A4C: LV SV MOD A2C:   55.1 ml LV SV MOD A4C:   75.2 ml LV SV MOD BP:    46.8 ml RIGHT VENTRICLE            IVC RV S prime:     7.94 cm/s  IVC diam: 1.90 cm TAPSE (M-mode): 1.7 cm LEFT ATRIUM             Index        RIGHT ATRIUM           Index LA diam:        4.50 cm 2.90 cm/m   RA Area:     11.90 cm LA Vol (A2C):   72.0 ml 46.44 ml/m  RA Volume:   23.20 ml  14.97 ml/m LA Vol (A4C):   64.3 ml 41.48 ml/m LA Biplane Vol: 68.3 ml 44.06 ml/m  AORTIC VALVE AV Area (Vmax):    0.99 cm AV Area (Vmean):   0.98  cm AV Area (VTI):     1.02 cm AV Vmax:           371.00 cm/s AV Vmean:          255.000 cm/s AV VTI:  0.814 m AV Peak Grad:      55.1 mmHg AV Mean Grad:      30.0 mmHg LVOT Vmax:         136.00 cm/s LVOT Vmean:        93.000 cm/s LVOT VTI:          0.310 m LVOT/AV VTI ratio: 0.38  AORTA Ao Root diam: 2.70 cm Ao Asc diam:  2.90 cm MITRAL VALVE            TRICUSPID VALVE MV Peak grad: 14.1 mmHg TR Peak grad:   41.0 mmHg MV Mean grad: 3.0 mmHg  TR Vmax:        320.00 cm/s MV Vmax:      1.88 m/s MV Vmean:     77.0 cm/s SHUNTS                         Systemic VTI:  0.31 m                         Systemic Diam: 1.85 cm Vinie Maxcy MD Electronically signed by Vinie Maxcy MD Signature Date/Time: 03/01/2024/4:25:54 PM    Final     Scheduled Meds:  sodium chloride    Intravenous Once   furosemide   40 mg Intravenous Once   levothyroxine   50 mcg Oral Daily   mesalamine  4 g Rectal QHS   metoprolol  tartrate  25 mg Oral Q6H   pantoprazole  (PROTONIX ) IV  40 mg Intravenous Q12H   sodium chloride  flush  3 mL Intravenous Q12H   triamcinolone  cream   Topical Daily   Continuous Infusions:  amiodarone  60 mg/hr (03/03/24 1011)   Followed by   amiodarone        LOS: 2 days   Ivonne Mustache, MD [facility] P10/08/2023, 11:07 AM

## 2024-03-04 DIAGNOSIS — D649 Anemia, unspecified: Secondary | ICD-10-CM | POA: Diagnosis not present

## 2024-03-04 LAB — CBC
HCT: 29 % — ABNORMAL LOW (ref 36.0–46.0)
Hemoglobin: 9.2 g/dL — ABNORMAL LOW (ref 12.0–15.0)
MCH: 27.9 pg (ref 26.0–34.0)
MCHC: 31.7 g/dL (ref 30.0–36.0)
MCV: 87.9 fL (ref 80.0–100.0)
Platelets: 285 K/uL (ref 150–400)
RBC: 3.3 MIL/uL — ABNORMAL LOW (ref 3.87–5.11)
RDW: 17 % — ABNORMAL HIGH (ref 11.5–15.5)
WBC: 8.8 K/uL (ref 4.0–10.5)
nRBC: 0 % (ref 0.0–0.2)

## 2024-03-04 LAB — TYPE AND SCREEN
ABO/RH(D): O POS
Antibody Screen: NEGATIVE
Unit division: 0
Unit division: 0
Unit division: 0

## 2024-03-04 LAB — BASIC METABOLIC PANEL WITH GFR
Anion gap: 10 (ref 5–15)
BUN: 24 mg/dL — ABNORMAL HIGH (ref 8–23)
CO2: 26 mmol/L (ref 22–32)
Calcium: 7.8 mg/dL — ABNORMAL LOW (ref 8.9–10.3)
Chloride: 101 mmol/L (ref 98–111)
Creatinine, Ser: 1 mg/dL (ref 0.44–1.00)
GFR, Estimated: 56 mL/min — ABNORMAL LOW (ref >60)
Glucose, Bld: 117 mg/dL — ABNORMAL HIGH (ref 70–99)
Potassium: 3.5 mmol/L (ref 3.5–5.1)
Sodium: 137 mmol/L (ref 135–145)

## 2024-03-04 LAB — BPAM RBC
Blood Product Expiration Date: 202510312359
Blood Product Expiration Date: 202511022359
Blood Product Expiration Date: 202511022359
ISSUE DATE / TIME: 202510020016
ISSUE DATE / TIME: 202510021232
Unit Type and Rh: 5100
Unit Type and Rh: 5100
Unit Type and Rh: 5100

## 2024-03-04 MED ORDER — METOPROLOL SUCCINATE ER 25 MG PO TB24
125.0000 mg | ORAL_TABLET | Freq: Every day | ORAL | Status: DC
  Administered 2024-03-04 – 2024-03-07 (×4): 125 mg via ORAL
  Filled 2024-03-04 (×5): qty 1

## 2024-03-04 NOTE — Plan of Care (Signed)
  Problem: Education: Goal: Ability to identify signs and symptoms of gastrointestinal bleeding will improve Outcome: Progressing   Problem: Clinical Measurements: Goal: Will remain free from infection Outcome: Progressing Goal: Respiratory complications will improve Outcome: Progressing Goal: Cardiovascular complication will be avoided Outcome: Progressing   Problem: Coping: Goal: Level of anxiety will decrease Outcome: Progressing   Problem: Elimination: Goal: Will not experience complications related to bowel motility Outcome: Progressing Goal: Will not experience complications related to urinary retention Outcome: Progressing   Problem: Safety: Goal: Ability to remain free from injury will improve Outcome: Progressing

## 2024-03-04 NOTE — Progress Notes (Signed)
 PROGRESS NOTE  Shannon Scott  FMW:[id number] DOB: [birth year] DOA: 02/29/2024 PCP: Elliot Charm, MD   Brief Narrative: Patient is 83 years female with history of hyperlipidemia, endometrial carcinoma, coronary artery disease status post CABG, paroxysmal A-fib on Eliquis , chronic HFpEF, lymphedema, colostomy, chronic anemia who presented for the evaluation of low hemoglobin as noted on outpatient blood work.  Patient was also having chest pressure triggered by exertion.  She was found to have a hemoglobin of 6.7.  Her stool was also noted to be dark.  On presentation, she was hemodynamically stable.  Lab work showed hemoglobin was 6.8, positive FOBT, troponin of 104.  Cardiology, GI consulted.  Given 2 units of blood transfusion. S/P EGD/colonoscopy on 10/3.  Hospital course also remarkable for development of A-fib with RVR, started on amiodarone  drip.  Currently remains in normal sinus rhythm.  PT evaluation pending  Assessment & Plan:  Principal Problem:   Symptomatic anemia Active Problems:   Endometrial cancer (HCC)   Hypothyroidism   Paroxysmal atrial fibrillation (HCC)   Coronary artery disease involving native coronary artery of native heart with angina pectoris   Elevated troponin   Chronic heart failure with preserved ejection fraction (HFpEF) (HCC)   GI bleeding  Symptomatic anemia/GI bleed: Presented with hemoglobin of 6.8 dark stool.  Given 2 units of blood transfusion.  Currently hemoglobin of 9. S/P  colonoscopy, EGD on 10/3 which showed 10 cm hiatal hernia, normal stomach/duodenum, erythematous mucosa in distal transverse colon, biopsy taken.  Started on mesalamine daily by GI.Eliquis /Plavix  on hold.  Stool on the colostomy bag appears less darker/ less red  Coronary artery disease/troponin elevation: Cardiology consulted and following.  She was following with cardiology as an outpatient and was planned for cardiac cath.  Troponins elevation Suspected to Be from Demand  Ischemia.  Cardiology not planning for additional work up  at this time due to her anemia.  Currently recommending to discontinue Plavix  permanently.  Echocardiogram showed EF of 55 to 60%, no wall motion abnormality  History of severe AS: Echocardiogram showed mild aortic valve regurgitation, severe aortic stenosis.  Cardiology following.  Given few doses of  IV Lasix   Paroxysmal A-fib: Monitor on telemetry.  Eliquis  on hold.  Went into A-fib with RVR on 10/3, started on amiodarone  drip.  Now amiodarone  stopped.  Started on long-acting metoprolol .  She remains in normal sinus rhythm this morning  Chronic HFpEF: Given few days of IV Lasix .  Appears near euvolemic status  Hypertension: Currently blood pressure stable.  Monitor  History of endometrial carcinoma: Status post hysterectomy, radiation therapy, chemotherapy.  Follows with Dr. Timmy.  History of sigmoid colon obstruction, status post transverse loop colostomy.  Hypothyroidism: Continue Synthyroid  Hyperlipidemia: Takes Zetia  at home.  Debility/deconditioning: PT evaluation requested         DVT prophylaxis:SCDs Start: 03/01/24 0416     Code Status: Full Code  Family Communication: Discussed with  son  at bedside on 10/5  Patient status:Inpatient  Patient is from :Home  Anticipated discharge un:Ynfz vs SnF  Estimated DC date:1-2 days   Consultants: Cardiology,GI  Procedures:EGD/colonoscopy  Antimicrobials:  Anti-infectives (From admission, onward)    None       Subjective: Patient seen and examined at bedside today.  Hemodynamically stable.  Overall comfortable.  Lying in bed.  On 1 L of oxygen per minute.  Denies chest pain or shortness of breath.  Remains in normal sinus rhythm.  Heart rate in the range of 60s.  Objective: Vitals:  03/04/24 0000 03/04/24 0024 03/04/24 0425 03/04/24 0705  BP: 121/65 121/65 (!) 107/59 110/69  Pulse: 82 70 61 69  Resp: 20  20 20   Temp: 98.4 F (36.9 C)  97.6 F  (36.4 C) 98.4 F (36.9 C)  TempSrc: Oral  Oral Oral  SpO2: 97%  99% 99%  Weight:   58.6 kg   Height:        Intake/Output Summary (Last 24 hours) at 03/04/2024 1137 Last data filed at 03/04/2024 1114 Gross per 24 hour  Intake 1126.94 ml  Output 700 ml  Net 426.94 ml   Filed Weights   03/02/24 0402 03/03/24 0500 03/04/24 0425  Weight: 56.7 kg 58.7 kg 58.6 kg    Examination:   General exam: Overall comfortable, not in distress HEENT: PERRL Respiratory system:  no wheezes or crackles but diminished sounds at bases Cardiovascular system: S1 & S2 heard, RRR.  Gastrointestinal system: Abdomen is nondistended, soft and nontender.  Colostomy with liquidy stool Central nervous system: Alert and oriented Extremities: Left lower extremity lymphedema, no clubbing ,no cyanosis Skin: No rashes, no ulcers,no icterus     Data Reviewed: I have personally reviewed following labs and imaging studies  CBC: Recent Labs  Lab 02/29/24 2127 03/01/24 0552 03/01/24 1841 03/02/24 0305 03/02/24 1155 03/02/24 1742 03/03/24 1032 03/04/24 0231  WBC 6.0 6.6  --  12.1*  --   --  10.7* 8.8  NEUTROABS 4.3  --   --   --   --   --   --   --   HGB 6.8* 7.5*   < > 9.8* 10.5* 10.7* 9.6* 9.2*  HCT 22.9* 23.3*   < > 30.9* 32.5* 34.2* 30.2* 29.0*  MCV 92.7 86.6  --  87.0  --   --  87.5 87.9  PLT 335 269  --  321  --   --  317 285   < > = values in this interval not displayed.   Basic Metabolic Panel: Recent Labs  Lab 02/29/24 2127 03/01/24 0552 03/02/24 0305 03/03/24 1032 03/04/24 0231  NA 138 138 140 139 137  K 3.5 3.5 3.9 3.2* 3.5  CL 102 106 102 104 101  CO2 25 25 25 27 26   GLUCOSE 129* 94 127* 118* 117*  BUN 20 18 14 16  24*  CREATININE 0.80 0.82 0.92 0.93 1.00  CALCIUM 8.5* 8.2* 8.2* 7.9* 7.8*  MG  --  1.6*  --   --   --      No results found for this or any previous visit (from the past 240 hours).   Radiology Studies: No results found.   Scheduled Meds:  sodium chloride     Intravenous Once   levothyroxine   50 mcg Oral Daily   mesalamine  4 g Rectal QHS   metoprolol  succinate  125 mg Oral Daily   pantoprazole  (PROTONIX ) IV  40 mg Intravenous Q12H   sodium chloride  flush  3 mL Intravenous Q12H   triamcinolone  cream   Topical Daily   Continuous Infusions:     LOS: 3 days   Ivonne Mustache, MD [facility] P10/09/2023, 11:37 AM

## 2024-03-04 NOTE — Plan of Care (Signed)
  Problem: Education: Goal: Ability to identify signs and symptoms of gastrointestinal bleeding will improve Outcome: Progressing   Problem: Bowel/Gastric: Goal: Will show no signs and symptoms of gastrointestinal bleeding Outcome: Progressing   Problem: Fluid Volume: Goal: Will show no signs and symptoms of excessive bleeding Outcome: Progressing   Problem: Clinical Measurements: Goal: Complications related to the disease process, condition or treatment will be avoided or minimized Outcome: Progressing   Problem: Education: Goal: Knowledge of General Education information will improve Description: Including pain rating scale, medication(s)/side effects and non-pharmacologic comfort measures Outcome: Progressing   Problem: Health Behavior/Discharge Planning: Goal: Ability to manage health-related needs will improve Outcome: Progressing   Problem: Clinical Measurements: Goal: Ability to maintain clinical measurements within normal limits will improve Outcome: Progressing Goal: Will remain free from infection Outcome: Progressing Goal: Diagnostic test results will improve Outcome: Progressing Goal: Respiratory complications will improve Outcome: Progressing Goal: Cardiovascular complication will be avoided Outcome: Progressing   Problem: Activity: Goal: Risk for activity intolerance will decrease Outcome: Progressing   Problem: Nutrition: Goal: Adequate nutrition will be maintained Outcome: Progressing   Problem: Coping: Goal: Level of anxiety will decrease Outcome: Progressing   Problem: Elimination: Goal: Will not experience complications related to bowel motility Outcome: Progressing Goal: Will not experience complications related to urinary retention Outcome: Progressing   Problem: Pain Managment: Goal: General experience of comfort will improve and/or be controlled Outcome: Progressing   Problem: Safety: Goal: Ability to remain free from injury will  improve Outcome: Progressing   Problem: Skin Integrity: Goal: Risk for impaired skin integrity will decrease Outcome: Progressing

## 2024-03-04 NOTE — Progress Notes (Signed)
 Subjective: No complaints.  Feeling better.  Objective: Vital signs in last 24 hours: Temp:  [97.6 F (36.4 C)-98.4 F (36.9 C)] 98.4 F (36.9 C) (10/05 0705) Pulse Rate:  [61-82] 69 (10/05 0705) Resp:  [20-26] 20 (10/05 0705) BP: (107-129)/(52-88) 110/69 (10/05 0705) SpO2:  [82 %-99 %] 99 % (10/05 0705) Weight:  [58.6 kg] 58.6 kg (10/05 0425) Last BM Date : 03/03/24  Intake/Output from previous day: 10/04 0701 - 10/05 0700 In: 1100.2 [P.O.:600; I.V.:500.2] Out: 700 [Urine:700] Intake/Output this shift: Total I/O In: 273 [P.O.:237; I.V.:36] Out: -   General appearance: alert and no distress GI: soft, non-tender; bowel sounds normal; no masses,  no organomegaly and transverse colon loop ostomy  Lab Results: Recent Labs    03/02/24 0305 03/02/24 1155 03/02/24 1742 03/03/24 1032 03/04/24 0231  WBC 12.1*  --   --  10.7* 8.8  HGB 9.8*   < > 10.7* 9.6* 9.2*  HCT 30.9*   < > 34.2* 30.2* 29.0*  PLT 321  --   --  317 285   < > = values in this interval not displayed.   BMET Recent Labs    03/02/24 0305 03/03/24 1032 03/04/24 0231  NA 140 139 137  K 3.9 3.2* 3.5  CL 102 104 101  CO2 25 27 26   GLUCOSE 127* 118* 117*  BUN 14 16 24*  CREATININE 0.92 0.93 1.00  CALCIUM 8.2* 7.9* 7.8*   LFT No results for input(s): PROT, ALBUMIN, AST, ALT, ALKPHOS, BILITOT, BILIDIR, IBILI in the last 72 hours. PT/INR No results for input(s): LABPROT, INR in the last 72 hours. Hepatitis Panel No results for input(s): HEPBSAG, HCVAB, HEPAIGM, HEPBIGM in the last 72 hours. C-Diff No results for input(s): CDIFFTOX in the last 72 hours. Fecal Lactopherrin No results for input(s): FECLLACTOFRN in the last 72 hours.  Studies/Results: DG CHEST PORT 1 VIEW Result Date: 03/02/2024 CLINICAL DATA:  Pulmonary edema. EXAM: PORTABLE CHEST 1 VIEW COMPARISON:  February 29, 2024. FINDINGS: Stable cardiomediastinal silhouette. Mildly increased interstitial densities  are noted bilaterally concerning for pulmonary edema. Small pleural effusions are noted. Status post coronary artery bypass graft. Right internal jugular Port-A-Cath is unchanged. IMPRESSION: Mildly increased interstitial densities are noted bilaterally concerning for pulmonary edema. Small pleural effusions are noted. Electronically Signed   By: Lynwood Landy Raddle M.D.   On: 03/02/2024 10:30    Medications: Scheduled:  sodium chloride    Intravenous Once   levothyroxine   50 mcg Oral Daily   mesalamine  4 g Rectal QHS   metoprolol  tartrate  25 mg Oral Q6H   pantoprazole  (PROTONIX ) IV  40 mg Intravenous Q12H   sodium chloride  flush  3 mL Intravenous Q12H   triamcinolone  cream   Topical Daily   Continuous:  amiodarone  30 mg/hr (03/04/24 0732)    Assessment/Plan: 1) Diversion colitis. 2) Anemia.   She and her son report that she did not have any problems with the enema through the ostomy yesterday.  The bleeding was still present, but it seems to be lower.  Her HGB did decline slightly.  I spent a significant amount of time explaining the anatomy and the options for treatment.  There is the possibility of administering the enema rectally provided that she does not have any sigmoid colon obstruction, however, she does have a long length of diversion colitis.  Application of the enema either through the ostomy or through the rectum, if that is a possibility, will not cover the entire affected lumen.  Plan: 1) Maintain the mesalamine enema. 2) If her HGB stabilizes tomorrow she can be discharged home with the enema and follow up with Dr. Kristie.  LOS: 3 days   Shannon Scott D 03/04/2024, 9:23 AM

## 2024-03-04 NOTE — Evaluation (Signed)
 Physical Therapy Evaluation Patient Details Name: Shannon Scott MRN: [id number] DOB: 1941-03-09 Today's Date: 03/04/2024  History of Present Illness  83 years female who presented 10/1 for the evaluation of low hemoglobin as noted on outpatient blood work. Dx: Symptomatic anemia/GI bleed, Coronary artery disease/troponin elevation. PMH: hyperlipidemia, endometrial carcinoma, coronary disease status post CABG, paroxysmal A-fib on Eliquis , chronic HFpEF, lymphedema, colostomy, chronic anemia.   Clinical Impression  Pt admitted with above diagnosis. PTA pt lived at home alone, mod I mobility with cane vs rollator vs no AD. Family assisted as needed. Pt currently with functional limitations due to the deficits listed below (see PT Problem List). On eval, pt required min assist bed mobility, CGA transfers, and CGA gait 30' with RW. Generalized weakness and deconditioning. VSS on RA with HR in 60s/70s. Pt will benefit from acute skilled PT to increase their independence and safety with mobility to allow discharge. Upon d/c, pt would benefit from HHPT. No DME needs.         If plan is discharge home, recommend the following: A little help with walking and/or transfers;A little help with bathing/dressing/bathroom;Assistance with cooking/housework;Assist for transportation;Help with stairs or ramp for entrance   Can travel by private vehicle        Equipment Recommendations None recommended by PT  Recommendations for Other Services       Functional Status Assessment Patient has had a recent decline in their functional status and demonstrates the ability to make significant improvements in function in a reasonable and predictable amount of time.     Precautions / Restrictions Precautions Precautions: Fall;Other (comment) Recall of Precautions/Restrictions: Intact Precaution/Restrictions Comments: monitor O2 and HR, ostomy, port R upper chest      Mobility  Bed Mobility Overal bed  mobility: Needs Assistance Bed Mobility: Supine to Sit, Sit to Supine     Supine to sit: HOB elevated, Used rails, Contact guard Sit to supine: Min assist, HOB elevated, Used rails   General bed mobility comments: increased time, no physical assist OOB, assist with BLE back to bed    Transfers Overall transfer level: Needs assistance Equipment used: Rolling walker (2 wheels) Transfers: Sit to/from Stand Sit to Stand: Contact guard assist           General transfer comment: increased time to power up, pt pulling up on RW    Ambulation/Gait Ambulation/Gait assistance: Contact guard assist Gait Distance (Feet): 30 Feet Assistive device: Rolling walker (2 wheels) Gait Pattern/deviations: Step-through pattern, Decreased stride length Gait velocity: decreased Gait velocity interpretation: <1.31 ft/sec, indicative of household ambulator   General Gait Details: slow, steady gait with RW  Stairs            Wheelchair Mobility     Tilt Bed    Modified Rankin (Stroke Patients Only)       Balance Overall balance assessment: Needs assistance Sitting-balance support: No upper extremity supported, Feet supported Sitting balance-Leahy Scale: Good     Standing balance support: Bilateral upper extremity supported, During functional activity, Reliant on assistive device for balance Standing balance-Leahy Scale: Poor                               Pertinent Vitals/Pain Pain Assessment Pain Assessment: Faces Faces Pain Scale: Hurts a little bit Pain Location: abdomen Pain Descriptors / Indicators: Sore, Guarding Pain Intervention(s): Monitored during session, Repositioned    Home Living Family/patient expects to be discharged to:: Private  residence Living Arrangements: Alone Available Help at Discharge: Family;Available 24 hours/day Type of Home: House Home Access: Stairs to enter Entrance Stairs-Rails: None Entrance Stairs-Number of Steps: 2   Home  Layout: One level Home Equipment: Agricultural consultant (2 wheels);Cane - single point;Shower seat;BSC/3in1;Rollator (4 wheels)      Prior Function Prior Level of Function : Needs assist             Mobility Comments: RW, cane, occasional use of rollator, Denies falls ADLs Comments: Assist with bathing     Extremity/Trunk Assessment   Upper Extremity Assessment Upper Extremity Assessment: Generalized weakness    Lower Extremity Assessment Lower Extremity Assessment: Generalized weakness    Cervical / Trunk Assessment Cervical / Trunk Assessment: Kyphotic  Communication   Communication Communication: No apparent difficulties    Cognition Arousal: Alert Behavior During Therapy: Flat affect, WFL for tasks assessed/performed   PT - Cognitive impairments: No apparent impairments                       PT - Cognition Comments: easily agitated if you push her. She wants to move at her own pace. Following commands: Intact       Cueing Cueing Techniques: Verbal cues     General Comments General comments (skin integrity, edema, etc.): VSS on RA    Exercises     Assessment/Plan    PT Assessment Patient needs continued PT services  PT Problem List Decreased strength;Decreased activity tolerance;Decreased mobility;Decreased balance       PT Treatment Interventions Therapeutic exercise;Gait training;Balance training;Functional mobility training;Stair training;Therapeutic activities;Patient/family education    PT Goals (Current goals can be found in the Care Plan section)  Acute Rehab PT Goals Patient Stated Goal: home PT Goal Formulation: With patient Time For Goal Achievement: 03/18/24 Potential to Achieve Goals: Good    Frequency Min 2X/week     Co-evaluation               AM-PAC PT 6 Clicks Mobility  Outcome Measure Help needed turning from your back to your side while in a flat bed without using bedrails?: A Little Help needed moving from  lying on your back to sitting on the side of a flat bed without using bedrails?: A Little Help needed moving to and from a bed to a chair (including a wheelchair)?: A Little Help needed standing up from a chair using your arms (e.g., wheelchair or bedside chair)?: A Little Help needed to walk in hospital room?: A Little Help needed climbing 3-5 steps with a railing? : A Lot 6 Click Score: 17    End of Session Equipment Utilized During Treatment: Gait belt Activity Tolerance: Patient tolerated treatment well Patient left: in bed;with call bell/phone within reach;with family/visitor present Nurse Communication: Mobility status PT Visit Diagnosis: Difficulty in walking, not elsewhere classified (R26.2);Muscle weakness (generalized) (M62.81)    Time: 8842-8778 PT Time Calculation (min) (ACUTE ONLY): 24 min   Charges:   PT Evaluation $PT Eval Low Complexity: 1 Low PT Treatments $Gait Training: 8-22 mins PT General Charges $$ ACUTE PT VISIT: 1 Visit         Sari MATSU., PT  Office # [phone]   Erven Sari Shaker 03/04/2024, 12:47 PM

## 2024-03-04 NOTE — Progress Notes (Signed)
 Rounding Note   Patient Name: Shannon Scott Date of Encounter: 03/04/2024  [facility] Cardiologist: Vinie JAYSON Maxcy, MD   Subjective Shannon Scott is a 83 y.o. female with a hx of CAD s/p CABG x 3 with LIMA-LAD, SVG-OM, SVG-RCA in 2005 and s/p DES to SVG-OM 2 in 03/2023, severe aortic stenosis, locally advanced endometrial cancer s/p chemoradiation, immunotherapy, hysterectomy with BSO, colonic mass with partial colectomy and colostomy, prior DVT, paroxysmal A-fib on eliquis , carotid artery stenosis, CVA, HTN, HLD, statin intolerance, lymphedema, asthma, hypothyroidism and cardiology was consulted for the evaluation of chest pain, elevated troponins at the request of Dr Darci.  Doing well today without any complaints. Feeling better. No significant overnight events.   Scheduled Meds:  sodium chloride    Intravenous Once   levothyroxine   50 mcg Oral Daily   mesalamine  4 g Rectal QHS   metoprolol  tartrate  25 mg Oral Q6H   pantoprazole  (PROTONIX ) IV  40 mg Intravenous Q12H   sodium chloride  flush  3 mL Intravenous Q12H   triamcinolone  cream   Topical Daily   Continuous Infusions:  amiodarone  30 mg/hr (03/04/24 0732)   PRN Meds: acetaminophen  **OR** acetaminophen , ondansetron  **OR** ondansetron  (ZOFRAN ) IV, traMADol    Vital Signs  Vitals:   03/04/24 0000 03/04/24 0024 03/04/24 0425 03/04/24 0705  BP: 121/65 121/65 (!) 107/59 110/69  Pulse: 82 70 61 69  Resp: 20  20 20   Temp: 98.4 F (36.9 C)  97.6 F (36.4 C) 98.4 F (36.9 C)  TempSrc: Oral  Oral Oral  SpO2: 97%  99% 99%  Weight:   58.6 kg   Height:        Intake/Output Summary (Last 24 hours) at 03/04/2024 1033 Last data filed at 03/04/2024 0917 Gross per 24 hour  Intake 1060.19 ml  Output 700 ml  Net 360.19 ml      03/04/2024    4:25 AM 03/03/2024    5:00 AM 03/02/2024    4:02 AM  Last 3 Weights  Weight (lbs) 129 lb 3 oz 129 lb 6.6 oz 125 lb  Weight (kg) 58.6 kg 58.7 kg 56.7 kg       Telemetry NSR - Personally Reviewed  ECG  NSR with ST depressions in precordial leads which are improving- Personally Reviewed   Physical Exam Vitals and nursing note reviewed.  Constitutional:      Appearance: Normal appearance.  HENT:     Head: Normocephalic and atraumatic.  Eyes:     Conjunctiva/sclera: Conjunctivae normal.  Neck:     Vascular: No carotid bruit.  Cardiovascular:     Rate and Rhythm: Normal rate and regular rhythm.     Heart sounds: Murmur heard.     Systolic murmur is present with a grade of 4/6.     Comments: Heave noted Pulmonary:     Effort: Pulmonary effort is normal.     Breath sounds: Normal breath sounds.  Musculoskeletal:        General: No swelling or tenderness.  Skin:    Coloration: Skin is not jaundiced or pale.  Neurological:     Mental Status: She is alert.      Labs High Sensitivity Troponin:   Recent Labs  Lab 03/01/24 0006 03/01/24 0552 03/01/24 1001 03/01/24 1135  TROPONINIHS 104* 91* 142* 308*     Chemistry Recent Labs  Lab 02/29/24 2127 03/01/24 0552 03/02/24 0305 03/03/24 1032 03/04/24 0231  NA 138 138 140 139 137  K 3.5 3.5 3.9  3.2* 3.5  CL 102 106 102 104 101  CO2 25 25 25 27 26   GLUCOSE 129* 94 127* 118* 117*  BUN 20 18 14 16  24*  CREATININE 0.80 0.82 0.92 0.93 1.00  CALCIUM 8.5* 8.2* 8.2* 7.9* 7.8*  MG  --  1.6*  --   --   --   PROT 5.4*  --   --   --   --   ALBUMIN 2.9*  --   --   --   --   AST 16  --   --   --   --   ALT 11  --   --   --   --   ALKPHOS 52  --   --   --   --   BILITOT 0.3  --   --   --   --   GFRNONAA >60 >60 >60 >60 56*  ANIONGAP 11 7 13 8 10     Lipids No results for input(s): CHOL, TRIG, HDL, LABVLDL, LDLCALC, CHOLHDL in the last 168 hours.  Hematology Recent Labs  Lab 03/02/24 0305 03/02/24 1155 03/02/24 1742 03/03/24 1032 03/04/24 0231  WBC 12.1*  --   --  10.7* 8.8  RBC 3.55*  --   --  3.45* 3.30*  HGB 9.8*   < > 10.7* 9.6* 9.2*  HCT 30.9*   < > 34.2*  30.2* 29.0*  MCV 87.0  --   --  87.5 87.9  MCH 27.6  --   --  27.8 27.9  MCHC 31.7  --   --  31.8 31.7  RDW 16.9*  --   --  17.0* 17.0*  PLT 321  --   --  317 285   < > = values in this interval not displayed.   Thyroid  No results for input(s): TSH, FREET4 in the last 168 hours.  BNP Recent Labs  Lab 03/01/24 1135  BNP 653.3*    DDimer No results for input(s): DDIMER in the last 168 hours.   Radiology  No results found.   Cardiac Studies  TTE 03/02/24:  IMPRESSIONS     1. Left ventricular ejection fraction, by estimation, is 55 to 60%. Left  ventricular ejection fraction by PLAX is 55 %. The left ventricle has  normal function. The left ventricle has no regional wall motion  abnormalities. Left ventricular diastolic  function could not be evaluated.   2. Right ventricular systolic function is moderately reduced. The right  ventricular size is normal. There is moderately elevated pulmonary artery  systolic pressure. The estimated right ventricular systolic pressure is  49.0 mmHg.   3. Left atrial size was moderately dilated.   4. The mitral valve is degenerative. Mild to moderate mitral valve  regurgitation. Moderate to severe mitral annular calcification.   5. Tricuspid valve regurgitation is mild to moderate.   6. The aortic valve is calcified. Aortic valve regurgitation is mild.  Severe aortic valve stenosis. Aortic valve area, by VTI measures 0.75 cm.  Aortic valve mean gradient measures 30.0 mmHg. Aortic valve Vmax measures  3.71 m/s. Peak gradient 55.1 mmHg,   DI 0.28.   7. The inferior vena cava is normal in size with <50% respiratory  variability, suggesting right atrial pressure of 8 mmHg.    Assessment & Plan   #NSTEMI, Type II #CAD s/p CABG Chest pain improved following blood transfusions - I do not recommend LHC at this time given that this is likely demand ischemia in the setting  of profound anemia - Please avoid anticoagulation at this time  given bleeding - DO NOT give nitroglycerin  to this patient given that she has severe AS as this could be life-threatening - Hold home Plavix  and consider discontinuing indefinitely   #Acute Blood Loss Anemia with probable diversion colitis GI planning on mesalamine enemas daily - Follow-up GI recs - Hold Eliquis  5 mg twice daily  #New SVT :: Developed sudden onset narrow complex tachycardia on 10/3 that broke with 5 mg IV metoprolol  x1.  Has not recurred - Maintain telemetry - change lopressor  to toprol  XL 120 mg for once daily dosing - discontinue amiodarone  since she's not on anticoagulation and continue to treat underlying pathology  #HFpEF #Pulmonary Edema :: Has developed some pulmonary edema on CXR in the setting of multiple blood transfusions.  She has underlying HFpEF so this is not entirely surprising.  Weight increased from yesterday by 4 lb - Continue gentle diuresis given severe AS  - Strict I's and O's   #Severe AS :: Known severe AS.  Critical that she maintains adequate blood volume.  Will get a complete echo. - Avoid nitroglycerin    #PAF - Holding Eliquis  as above - Holding diltiazem  given softer blood pressures - discontinue amiodarone    #HLD - Continue Zetia  - Hold Repatha  inpatient       For questions or updates, please contact [facility] Please consult [email or web address] for contact info under       Signed, Emeline FORBES Calender, MD  03/04/2024, 10:33 AM

## 2024-03-05 ENCOUNTER — Encounter (HOSPITAL_COMMUNITY): Admission: RE | Payer: Self-pay | Source: Home / Self Care

## 2024-03-05 ENCOUNTER — Ambulatory Visit (HOSPITAL_COMMUNITY): Admission: RE | Admit: 2024-03-05 | Source: Home / Self Care | Admitting: Cardiovascular Disease

## 2024-03-05 ENCOUNTER — Encounter (HOSPITAL_COMMUNITY): Payer: Self-pay | Admitting: Gastroenterology

## 2024-03-05 DIAGNOSIS — I350 Nonrheumatic aortic (valve) stenosis: Secondary | ICD-10-CM

## 2024-03-05 DIAGNOSIS — D649 Anemia, unspecified: Secondary | ICD-10-CM | POA: Diagnosis not present

## 2024-03-05 DIAGNOSIS — I480 Paroxysmal atrial fibrillation: Secondary | ICD-10-CM | POA: Diagnosis not present

## 2024-03-05 LAB — BASIC METABOLIC PANEL WITH GFR
Anion gap: 7 (ref 5–15)
BUN: 20 mg/dL (ref 8–23)
CO2: 27 mmol/L (ref 22–32)
Calcium: 8.2 mg/dL — ABNORMAL LOW (ref 8.9–10.3)
Chloride: 102 mmol/L (ref 98–111)
Creatinine, Ser: 0.67 mg/dL (ref 0.44–1.00)
GFR, Estimated: >60 mL/min (ref >60)
Glucose, Bld: 111 mg/dL — ABNORMAL HIGH (ref 70–99)
Potassium: 3.7 mmol/L (ref 3.5–5.1)
Sodium: 136 mmol/L (ref 135–145)

## 2024-03-05 LAB — CBC
HCT: 30.3 % — ABNORMAL LOW (ref 36.0–46.0)
Hemoglobin: 9.4 g/dL — ABNORMAL LOW (ref 12.0–15.0)
MCH: 27.3 pg (ref 26.0–34.0)
MCHC: 31 g/dL (ref 30.0–36.0)
MCV: 88.1 fL (ref 80.0–100.0)
Platelets: 309 K/uL (ref 150–400)
RBC: 3.44 MIL/uL — ABNORMAL LOW (ref 3.87–5.11)
RDW: 16.2 % — ABNORMAL HIGH (ref 11.5–15.5)
WBC: 7.4 K/uL (ref 4.0–10.5)
nRBC: 0 % (ref 0.0–0.2)

## 2024-03-05 SURGERY — LEFT HEART CATH AND CORONARY ANGIOGRAPHY
Anesthesia: LOCAL

## 2024-03-05 MED ORDER — MESALAMINE 4 G RE ENEM
4.0000 g | ENEMA | Freq: Every day | RECTAL | Status: DC
  Administered 2024-03-06 – 2024-03-13 (×2): 4 g via RECTAL
  Filled 2024-03-05 (×9): qty 60

## 2024-03-05 MED ORDER — POLYETHYLENE GLYCOL 3350 17 G PO PACK
17.0000 g | PACK | Freq: Every day | ORAL | Status: DC
Start: 2024-03-05 — End: 2024-03-14
  Administered 2024-03-05 – 2024-03-14 (×10): 17 g via ORAL
  Filled 2024-03-05 (×10): qty 1

## 2024-03-05 NOTE — Progress Notes (Signed)
 PROGRESS NOTE  Shannon Scott  FMW:[id number] DOB: Dec 16, 1940 DOA: 02/29/2024 PCP: Elliot Charm, MD   Brief Narrative: Patient is 82 years female with history of hyperlipidemia, endometrial carcinoma, coronary artery disease status post CABG, paroxysmal A-fib on Eliquis , chronic HFpEF, lymphedema, colostomy, chronic anemia who presented for the evaluation of low hemoglobin as noted on outpatient blood work.  Patient was also having chest pressure triggered by exertion.  She was found to have a hemoglobin of 6.7.  Her stool was also noted to be dark.  On presentation, she was hemodynamically stable.  Lab work showed hemoglobin was 6.8, positive FOBT, troponin of 104.  Cardiology, GI consulted.  Given 2 units of blood transfusion. S/P EGD/colonoscopy on 10/3.  Hospital course also remarkable for development of A-fib with RVR, started on amiodarone  drip.  Currently remains in normal sinus rhythm.  PT evaluation done, recommended home health.  She is having problem with putting mesalamine enema through her colostomy.  GI follow-up requested  Assessment & Plan:  Principal Problem:   Symptomatic anemia Active Problems:   Endometrial cancer (HCC)   Hypothyroidism   Paroxysmal atrial fibrillation (HCC)   Coronary artery disease involving native coronary artery of native heart with angina pectoris   Elevated troponin   Chronic heart failure with preserved ejection fraction (HFpEF) (HCC)   GI bleeding  Symptomatic anemia/GI bleed: Presented with hemoglobin of 6.8 dark stool.  Given 2 units of blood transfusion.  Currently hemoglobin of 9. S/P  colonoscopy, EGD on 10/3 which showed 10 cm hiatal hernia, normal stomach/duodenum, erythematous mucosa in distal transverse colon, biopsy taken.  Started on mesalamine daily by GI.Eliquis /Plavix  on hold.  Stool on the colostomy bag appears less darker/ less red.She is having problem with putting mesalamine enema through her colostomy.  GI follow-up  requested.  The colostomy bag was also severely inflated due to gas.  Wound care nurse consulted for colostomy care.  Coronary artery disease/troponin elevation: Cardiology consulted and following.  She was following with cardiology as an outpatient and was planned for cardiac cath.  Troponins elevation Suspected to Be from Demand Ischemia.  Cardiology not planning for additional work up  at this time due to her anemia.  Currently recommending to discontinue Plavix  permanently.  Echocardiogram showed EF of 55 to 60%, no wall motion abnormality  History of severe AS: Echocardiogram showed mild aortic valve regurgitation, severe aortic stenosis.  Cardiology following.  Given few doses of  IV Lasix  here.  Currently appears euvolemic  Paroxysmal A-fib: Monitor on telemetry.  Eliquis  on hold.  Went into A-fib with RVR on 10/3, started on amiodarone  drip.  Now amiodarone  stopped.  Started on long-acting metoprolol .  She remains in normal sinus rhythm this morning  Chronic HFpEF: Given few days of IV Lasix .  Appears near euvolemic status  Hypertension: Currently blood pressure stable.  Monitor  History of endometrial carcinoma: Status post hysterectomy, radiation therapy, chemotherapy.  Follows with Dr. Timmy.  History of sigmoid colon obstruction, status post transverse loop colostomy.  Hypothyroidism: Continue Synthyroid  Hyperlipidemia: Takes Zetia  at home.  Debility/deconditioning: PT evaluation requested, recommended home health         DVT prophylaxis:SCDs Start: 03/01/24 0416     Code Status: Full Code  Family Communication: Discussed with  son  at bedside on 10/6  Patient status:Inpatient  Patient is from :Home  Anticipated discharge un:Ynfz with home health  Estimated DC date:1-2 days   Consultants: Cardiology,GI  Procedures:EGD/colonoscopy  Antimicrobials:  Anti-infectives (From admission, onward)  None       Subjective: Patient seen and examined at  bedside today.  Appears overall comfortable.  On room air.  Denies shortness of breath or chest pain.  She is having a problem with putting mesalamine enema on her colostomy.  Colostomy bag also bloated and she says that the stool is not coming normally.  We discussed about requesting for GI evaluation today and ostomy nurse consultation  Objective: Vitals:   03/04/24 2230 03/05/24 0500 03/05/24 0527 03/05/24 0733  BP: 130/74  (!) 107/95 123/60  Pulse: 76  66 69  Resp: 16  20 20   Temp: 98 F (36.7 C)  98.4 F (36.9 C) 98.3 F (36.8 C)  TempSrc: Oral  Oral Oral  SpO2: 96%  97% 95%  Weight:  53.9 kg    Height:        Intake/Output Summary (Last 24 hours) at 03/05/2024 1034 Last data filed at 03/05/2024 0500 Gross per 24 hour  Intake 306.75 ml  Output 350 ml  Net -43.25 ml   Filed Weights   03/03/24 0500 03/04/24 0425 03/05/24 0500  Weight: 58.7 kg 58.6 kg 53.9 kg    Examination:   General exam: Overall comfortable, not in distress HEENT: PERRL Respiratory system:  no wheezes or crackles, diminished sounds at bases Cardiovascular system: S1 & S2 heard, RRR.  Gastrointestinal system: Abdomen is nondistended, soft and nontender.  Colostomy bag bloated with the air, scant liquid stool Central nervous system: Alert and oriented Extremities: Left lower extremity lymphedema, no clubbing or cyanosis Skin: No rashes, no ulcers,no icterus       Data Reviewed: I have personally reviewed following labs and imaging studies  CBC: Recent Labs  Lab 02/29/24 2127 03/01/24 0552 03/01/24 1841 03/02/24 0305 03/02/24 1155 03/02/24 1742 03/03/24 1032 03/04/24 0231  WBC 6.0 6.6  --  12.1*  --   --  10.7* 8.8  NEUTROABS 4.3  --   --   --   --   --   --   --   HGB 6.8* 7.5*   < > 9.8* 10.5* 10.7* 9.6* 9.2*  HCT 22.9* 23.3*   < > 30.9* 32.5* 34.2* 30.2* 29.0*  MCV 92.7 86.6  --  87.0  --   --  87.5 87.9  PLT 335 269  --  321  --   --  317 285   < > = values in this interval not  displayed.   Basic Metabolic Panel: Recent Labs  Lab 02/29/24 2127 03/01/24 0552 03/02/24 0305 03/03/24 1032 03/04/24 0231  NA 138 138 140 139 137  K 3.5 3.5 3.9 3.2* 3.5  CL 102 106 102 104 101  CO2 25 25 25 27 26   GLUCOSE 129* 94 127* 118* 117*  BUN 20 18 14 16  24*  CREATININE 0.80 0.82 0.92 0.93 1.00  CALCIUM 8.5* 8.2* 8.2* 7.9* 7.8*  MG  --  1.6*  --   --   --      No results found for this or any previous visit (from the past 240 hours).   Radiology Studies: No results found.   Scheduled Meds:  sodium chloride    Intravenous Once   levothyroxine   50 mcg Oral Daily   mesalamine  4 g Rectal QHS   metoprolol  succinate  125 mg Oral Daily   pantoprazole  (PROTONIX ) IV  40 mg Intravenous Q12H   sodium chloride  flush  3 mL Intravenous Q12H   triamcinolone  cream   Topical Daily  Continuous Infusions:     LOS: 4 days   Ivonne Mustache, MD [facility] P10/10/2023, 10:34 AM

## 2024-03-05 NOTE — Progress Notes (Signed)
 Physical Therapy Treatment Patient Details Name: Shannon Scott MRN: [id number] DOB: 05-Apr-1941 Today's Date: 03/05/2024   History of Present Illness 83 yo F adm 02/29/24 for low Hgb with anemia, GIB. PMHx: CAD s/p CABG, severe aortic stenosis, endometrial CA, hysterectomy, colostomy, Afib, HTN, HLD, HFpEF, lymphedema, asthma    PT Comments  Pt lives alone with family nearby who check in on her and bring her meals daily. 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 She normally uses furniture for stability in home and discussed recommendation for rollator use at home for safety and stability. Pt near baseline function and will have family assist. Will continue to follow acutely but no longer feel HHPT required. Encouraged OOB for meals and continued mobility with staff.     If plan is discharge home, recommend the following: A little help with bathing/dressing/bathroom;Assistance with cooking/housework;Assist for transportation;Help with stairs or ramp for entrance   Can travel by private vehicle        Equipment Recommendations  None recommended by PT    Recommendations for Other Services       Precautions / Restrictions Precautions Precautions: Fall;Other (comment) Recall of Precautions/Restrictions: Intact Precaution/Restrictions Comments: ostomy, port R upper chest     Mobility  Bed Mobility Overal bed mobility: Modified Independent Bed Mobility: Supine to Sit           General bed mobility comments: HOb 15 degrees, use of rail, increased time    Transfers Overall transfer level: Modified independent                      Ambulation/Gait Ambulation/Gait assistance: Supervision Gait Distance (Feet): 210 Feet Assistive device: Rolling walker (2 wheels) Gait Pattern/deviations: Step-through pattern, Decreased stride length   Gait velocity interpretation: 1.31 - 2.62 ft/sec, indicative of limited community ambulator   General Gait Details: slow, steady gait  with RW, preferred laps in room   Stairs             Wheelchair Mobility     Tilt Bed    Modified Rankin (Stroke Patients Only)       Balance Overall balance assessment: Needs assistance Sitting-balance support: No upper extremity supported, Feet supported Sitting balance-Leahy Scale: Good     Standing balance support: Bilateral upper extremity supported, During functional activity, Reliant on assistive device for balance Standing balance-Leahy Scale: Poor Standing balance comment: RW for gait                            Communication Communication Communication: No apparent difficulties  Cognition Arousal: Alert Behavior During Therapy: WFL for tasks assessed/performed   PT - Cognitive impairments: No apparent impairments                         Following commands: Intact      Cueing Cueing Techniques: Verbal cues  Exercises      General Comments        Pertinent Vitals/Pain Pain Assessment Pain Assessment: No/denies pain    Home Living                          Prior Function            PT Goals (current goals can now be found in the care plan section) Progress towards PT goals: Progressing toward goals    Frequency    Min 1X/week  PT Plan      Co-evaluation              AM-PAC PT 6 Clicks Mobility   Outcome Measure  Help needed turning from your back to your side while in a flat bed without using bedrails?: None Help needed moving from lying on your back to sitting on the side of a flat bed without using bedrails?: None Help needed moving to and from a bed to a chair (including a wheelchair)?: A Little Help needed standing up from a chair using your arms (e.g., wheelchair or bedside chair)?: A Little Help needed to walk in hospital room?: A Little Help needed climbing 3-5 steps with a railing? : A Little 6 Click Score: 20    End of Session   Activity Tolerance: Patient tolerated  treatment well Patient left: in chair;with call bell/phone within reach;with family/visitor present Nurse Communication: Mobility status PT Visit Diagnosis: Difficulty in walking, not elsewhere classified (R26.2);Muscle weakness (generalized) (M62.81)     Time: 8755-8684 PT Time Calculation (min) (ACUTE ONLY): 31 min  Charges:    $Gait Training: 23-37 mins PT General Charges $$ ACUTE PT VISIT: 1 Visit                     Lenoard SQUIBB, PT Acute Rehabilitation Services Office: [phone]    Jewelle Whitner B Christia Domke 03/05/2024, 1:50 PM

## 2024-03-05 NOTE — Progress Notes (Signed)
 Subjective: Patient is well-known to me from my association with her from previous office visits but I have not seen her in the recent past.  Events since admission noted chart reviewed.  I have an extensive discussion with the patient's son and the patient about the diversion colitis and need for mesalamine enemas.  They were very confused as to how these enemas could be administered and therefore they wanted me to show them how this can be done at the bedside.  Objective: Vital signs in last 24 hours: Temp:  [98 F (36.7 C)-99 F (37.2 C)] 98.3 F (36.8 C) (10/06 0733) Pulse Rate:  [64-77] 69 (10/06 0733) Resp:  [16-20] 20 (10/06 0733) BP: (107-130)/(60-95) 123/60 (10/06 0733) SpO2:  [93 %-99 %] 95 % (10/06 0733) Weight:  [53.9 kg] 53.9 kg (10/06 0500) Last BM Date : 03/03/24  Intake/Output from previous day: 10/05 0701 - 10/06 0700 In: 579.8 [P.O.:477; I.V.:102.8] Out: 350 [Urine:350] Intake/Output this shift: No intake/output data recorded.  General appearance: alert, cooperative, appears stated age, fatigued, and no distress Resp: clear to auscultation bilaterally Cardio: systolic murmur: D8-D7 regular loud systolic murmur grade 4/6 GI: soft, non-tender; bowel sounds normal; no masses,  no organomegaly; colostomy in the left lower quadrant and after removing the colostomy bag enema was administered on the left side  Lab Results: Recent Labs    03/02/24 1742 03/03/24 1032 03/04/24 0231  WBC  --  10.7* 8.8  HGB 10.7* 9.6* 9.2*  HCT 34.2* 30.2* 29.0*  PLT  --  317 285   BMET Recent Labs    03/03/24 1032 03/04/24 0231  NA 139 137  K 3.2* 3.5  CL 104 101  CO2 27 26  GLUCOSE 118* 117*  BUN 16 24*  CREATININE 0.93 1.00  CALCIUM 7.9* 7.8*   Studies/Results: No results found.  Medications: I have reviewed the patient's current medications. Prior to Admission:  Medications Prior to Admission  Medication Sig Dispense Refill Last Dose/Taking   apixaban  (ELIQUIS ) 5  MG TABS tablet Take 1 tablet (5 mg total) by mouth 2 (two) times daily. 180 tablet 3 02/29/2024 at  6:00 PM   clopidogrel  (PLAVIX ) 75 MG tablet Take 1 tablet (75 mg total) by mouth daily with breakfast. 30 tablet 4 02/29/2024 at  8:00 AM   Cyanocobalamin  (VITAMIN B-12) 5000 MCG LOZG Take 1 each by mouth in the morning.   02/29/2024 at  8:00 AM   Evolocumab  (REPATHA  SURECLICK) 140 MG/ML SOAJ INJECT 1 DOSE UNDER THE SKIN EVERY 14 DAYS 6 mL 1 02/22/2024   ezetimibe  (ZETIA ) 10 MG tablet Take 10 mg by mouth daily.   02/29/2024 at  6:00 PM   furosemide  (LASIX ) 20 MG tablet Take 1 tablet (20 mg total) by mouth every Monday, Wednesday, and Friday. 36 tablet 3 02/29/2024   levothyroxine  (SYNTHROID ) 50 MCG tablet Take 1 tablet (50 mcg total) by mouth daily. 90 tablet 3 02/29/2024 Morning   Multiple Vitamins-Minerals (ONE A DAY WOMEN 50 PLUS) CHEW Chew 1 each by mouth daily at 6 PM. One-A-Day Women's 50+ Gummies;  Chew one gummy by mouth every evening at 1800.   02/29/2024 at  6:00 PM   polyethylene glycol (MIRALAX  / GLYCOLAX ) 17 g packet Take 17 g by mouth daily as needed for severe constipation. (Patient taking differently: Take 17 g by mouth daily.) 14 each 0 02/29/2024 at  8:00 AM   TIADYLT  ER 300 MG 24 hr capsule Take 300 mg by mouth daily.   02/29/2024  at  6:00 PM   traMADol  (ULTRAM ) 50 MG tablet TAKE 2 TABLETS(100 MG) BY MOUTH TWICE DAILY 120 tablet 0 02/29/2024 at  6:00 PM   nitroGLYCERIN  (NITROSTAT ) 0.4 MG SL tablet Place 1 tablet (0.4 mg total) under the tongue every 5 (five) minutes as needed for chest pain. (Patient not taking: Reported on 03/01/2024) 25 tablet 0 Not Taking   Scheduled:  sodium chloride    Intravenous Once   levothyroxine   50 mcg Oral Daily   mesalamine  4 g Rectal QHS   metoprolol  succinate  125 mg Oral Daily   pantoprazole  (PROTONIX ) IV  40 mg Intravenous Q12H   polyethylene glycol  17 g Oral Daily   sodium chloride  flush  3 mL Intravenous Q12H   triamcinolone  cream   Topical Daily    Continuous: PRN:acetaminophen  **OR** acetaminophen , ondansetron  **OR** ondansetron  (ZOFRAN ) IV, traMADol   Assessment/Plan: 1) Diversion colitis with iron  deficiency anemia-consider to try mesalamine enemas and see the patient in follow-up in the office within the next 1 to 2 weeks to monitor her CBCs closely and make further recommendations accordingly. I had a lengthy discussion with the patient and her son who is at the bedside about the need for admission administering the mesalamine enemas and showed them how this can be done .  2) Large hiatal hernia. 3)Coronary artery disease with elevated troponins on admission/severe aortic stenosis 4) Paroxysmal atrial fibrillation. 5) Chronic heart failure with preserved ejection fraction 6) Hypertension/hyperlipidemia 7) History of endometrial carcinoma 8) Hypothyroidism.  LOS: 4 days   Renaye Sous 03/05/2024, 11:03 AM

## 2024-03-05 NOTE — Plan of Care (Signed)
  Problem: Bowel/Gastric: Goal: Will show no signs and symptoms of gastrointestinal bleeding Outcome: Progressing   Problem: Fluid Volume: Goal: Will show no signs and symptoms of excessive bleeding Outcome: Progressing   Problem: Clinical Measurements: Goal: Complications related to the disease process, condition or treatment will be avoided or minimized Outcome: Progressing   Problem: Clinical Measurements: Goal: Cardiovascular complication will be avoided Outcome: Progressing

## 2024-03-05 NOTE — Anesthesia Postprocedure Evaluation (Signed)
 Anesthesia Post Note  Patient: Shannon Scott  Procedure(s) Performed: EGD (ESOPHAGOGASTRODUODENOSCOPY) COLONOSCOPY     Patient location during evaluation: PACU Anesthesia Type: MAC Level of consciousness: awake and alert Pain management: pain level controlled Vital Signs Assessment: post-procedure vital signs reviewed and stable Respiratory status: spontaneous breathing, nonlabored ventilation and respiratory function stable Cardiovascular status: stable and blood pressure returned to baseline Anesthetic complications: no   No notable events documented.  Last Vitals:  Vitals:   03/04/24 2230 03/05/24 0527  BP: 130/74 (!) 107/95  Pulse: 76 66  Resp: 16 20  Temp: 36.7 C 36.9 C  SpO2: 96% 97%    Last Pain:  Vitals:   03/05/24 0527  TempSrc: Oral  PainSc:                  Debby FORBES Like

## 2024-03-05 NOTE — Progress Notes (Signed)
 Rounding Note   Patient Name: Shannon Scott Date of Encounter: 03/05/2024  [facility] Cardiologist: Vinie JAYSON Maxcy, MD   Subjective Shannon Scott is a 83 y.o. female with a hx of CAD s/p CABG x 3 with LIMA-LAD, SVG-OM, SVG-RCA in 2005 and s/p DES to SVG-OM 2 in 03/2023, severe aortic stenosis, locally advanced endometrial cancer s/p chemoradiation, immunotherapy, hysterectomy with BSO, colonic mass with partial colectomy and colostomy, prior DVT, paroxysmal A-fib on eliquis , carotid artery stenosis, CVA, HTN, HLD, statin intolerance, lymphedema, asthma, hypothyroidism and cardiology was consulted for the evaluation of chest pain, elevated troponins at the request of Dr Darci.  Worried about her ostomy. Distended Ostomy nurse in room No cardiac complaints   Scheduled Meds:  sodium chloride    Intravenous Once   levothyroxine   50 mcg Oral Daily   mesalamine  4 g Rectal QHS   metoprolol  succinate  125 mg Oral Daily   pantoprazole  (PROTONIX ) IV  40 mg Intravenous Q12H   sodium chloride  flush  3 mL Intravenous Q12H   triamcinolone  cream   Topical Daily   Continuous Infusions:   PRN Meds: acetaminophen  **OR** acetaminophen , ondansetron  **OR** ondansetron  (ZOFRAN ) IV, traMADol    Vital Signs  Vitals:   03/04/24 2230 03/05/24 0500 03/05/24 0527 03/05/24 0733  BP: 130/74  (!) 107/95 123/60  Pulse: 76  66 69  Resp: 16  20 20   Temp: 98 F (36.7 C)  98.4 F (36.9 C) 98.3 F (36.8 C)  TempSrc: Oral  Oral Oral  SpO2: 96%  97% 95%  Weight:  53.9 kg    Height:        Intake/Output Summary (Last 24 hours) at 03/05/2024 0948 Last data filed at 03/05/2024 0500 Gross per 24 hour  Intake 306.75 ml  Output 350 ml  Net -43.25 ml      03/05/2024    5:00 AM 03/04/2024    4:25 AM 03/03/2024    5:00 AM  Last 3 Weights  Weight (lbs) 118 lb 14.4 oz 129 lb 3 oz 129 lb 6.6 oz  Weight (kg) 53.933 kg 58.6 kg 58.7 kg      Telemetry SVT this AM now with NSR - Personally  Reviewed  ECG  NSR with ST depressions in precordial leads which are improving- Personally Reviewed   Physical Exam Vitals and nursing note reviewed.  Constitutional:      Appearance: Normal appearance.  HENT:     Head: Normocephalic and atraumatic.  Eyes:     Conjunctiva/sclera: Conjunctivae normal.  Neck:     Vascular: No carotid bruit.  Cardiovascular:     Rate and Rhythm: Normal rate and regular rhythm.     Heart sounds: Murmur heard.     Systolic murmur is present with a grade of 4/6.  Pulmonary:     Effort: Pulmonary effort is normal.     Breath sounds: Normal breath sounds.  Musculoskeletal:        General: No swelling or tenderness.  Skin:    Coloration: Skin is not jaundiced or pale.  Neurological:     Mental Status: She is alert.      Labs High Sensitivity Troponin:   Recent Labs  Lab 03/01/24 0006 03/01/24 0552 03/01/24 1001 03/01/24 1135  TROPONINIHS 104* 91* 142* 308*     Chemistry Recent Labs  Lab 02/29/24 2127 03/01/24 0552 03/02/24 0305 03/03/24 1032 03/04/24 0231  NA 138 138 140 139 137  K 3.5 3.5 3.9 3.2* 3.5  CL 102  106 102 104 101  CO2 25 25 25 27 26   GLUCOSE 129* 94 127* 118* 117*  BUN 20 18 14 16  24*  CREATININE 0.80 0.82 0.92 0.93 1.00  CALCIUM 8.5* 8.2* 8.2* 7.9* 7.8*  MG  --  1.6*  --   --   --   PROT 5.4*  --   --   --   --   ALBUMIN 2.9*  --   --   --   --   AST 16  --   --   --   --   ALT 11  --   --   --   --   ALKPHOS 52  --   --   --   --   BILITOT 0.3  --   --   --   --   GFRNONAA >60 >60 >60 >60 56*  ANIONGAP 11 7 13 8 10     Lipids No results for input(s): CHOL, TRIG, HDL, LABVLDL, LDLCALC, CHOLHDL in the last 168 hours.  Hematology Recent Labs  Lab 03/02/24 0305 03/02/24 1155 03/02/24 1742 03/03/24 1032 03/04/24 0231  WBC 12.1*  --   --  10.7* 8.8  RBC 3.55*  --   --  3.45* 3.30*  HGB 9.8*   < > 10.7* 9.6* 9.2*  HCT 30.9*   < > 34.2* 30.2* 29.0*  MCV 87.0  --   --  87.5 87.9  MCH 27.6  --    --  27.8 27.9  MCHC 31.7  --   --  31.8 31.7  RDW 16.9*  --   --  17.0* 17.0*  PLT 321  --   --  317 285   < > = values in this interval not displayed.   Thyroid  No results for input(s): TSH, FREET4 in the last 168 hours.  BNP Recent Labs  Lab 03/01/24 1135  BNP 653.3*    DDimer No results for input(s): DDIMER in the last 168 hours.   Radiology  No results found.   Cardiac Studies  TTE 03/02/24:  IMPRESSIONS     1. Left ventricular ejection fraction, by estimation, is 55 to 60%. Left  ventricular ejection fraction by PLAX is 55 %. The left ventricle has  normal function. The left ventricle has no regional wall motion  abnormalities. Left ventricular diastolic  function could not be evaluated.   2. Right ventricular systolic function is moderately reduced. The right  ventricular size is normal. There is moderately elevated pulmonary artery  systolic pressure. The estimated right ventricular systolic pressure is  49.0 mmHg.   3. Left atrial size was moderately dilated.   4. The mitral valve is degenerative. Mild to moderate mitral valve  regurgitation. Moderate to severe mitral annular calcification.   5. Tricuspid valve regurgitation is mild to moderate.   6. The aortic valve is calcified. Aortic valve regurgitation is mild.  Severe aortic valve stenosis. Aortic valve area, by VTI measures 0.75 cm.  Aortic valve mean gradient measures 30.0 mmHg. Aortic valve Vmax measures  3.71 m/s. Peak gradient 55.1 mmHg,   DI 0.28.   7. The inferior vena cava is normal in size with <50% respiratory  variability, suggesting right atrial pressure of 8 mmHg.    Assessment & Plan   #NSTEMI, Type II #CAD s/p CABG Chest pain improved following blood transfusions - no plans for cath given severe anemia  - DO NOT give nitroglycerin  to this patient given that she has severe AS as  this could be life-threatening - Discontinue Plavix     #Acute Blood Loss Anemia with probable  diversion colitis GI planning on mesalamine enemas daily - Follow-up GI recs - Hold Eliquis  5 mg twice daily - Ostomy not working well be GI   #New SVT :: Developed sudden onset narrow complex tachycardia on 10/3 that broke with 5 mg IV metoprolol  x1.   - Maintain telemetry  NSR this am  - continue lopressor    #HFpEF #Pulmonary Edema :: Has developed some pulmonary edema on CXR in the setting of multiple blood transfusions.  Likely from AS and diastolic dysfunction  - Continue gentle diuresis given severe AS  - Strict I's and O's   #Severe AS :: Known severe AS.   Mean gradient 30 mmHg AVA 0.75 cm2 DVI 0.38  - She indicates murmur her whole life She is a retired Engineer, civil (consulting) and is not interested In potential TAVR w/u and not likely a candidate with co morbidities    #PAF - Holding Eliquis  as above - Holding diltiazem  given softer blood pressures   #HLD - Continue Zetia  - Hold Repatha  inpatient       For questions or updates, please contact [facility] Please consult [email or web address] for contact info under       Signed, Maude Emmer, MD  03/05/2024, 9:48 AM

## 2024-03-05 NOTE — Consult Note (Addendum)
 WOC Nurse ostomy consult note Stoma type/location: LMQ colostomy with parastomal hernia Stomal assessment/size: 1.5 cm x 4 cm flush Peristomal assessment: crease at 3 o'clock and large rounded hernia noted (photo in chart)  HAs been present for years, she indicates.   Treatment options for stomal/peristomal skin: barrier ring.  Prefers 2 piece while here in hospital so staff can remove pouch to empty.  She wears one piece flat (convatec) at home.  Output milky, blood tinged fluid in pouch (60 ml) no stool.  She takes miralax  daily and has not had it since being admitted.   Ostomy pouching: 2pc. 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  She is concerned about no stooling.  She has orders for mesalamine enema daily.  She has lots of flatus in pouch.  Long term use of laxatives daily. Her daughter performs pouch care at home.  Enrolled patient in [place] Secure Start DC program:NA Will not follow at this time.  Please re-consult if needed.  Darice Cooley MSN, RN, FNP-BC CWON Wound, Ostomy, Continence Nurse Outpatient [facility] [phone] Work cell phone:  [phone]

## 2024-03-06 DIAGNOSIS — I350 Nonrheumatic aortic (valve) stenosis: Secondary | ICD-10-CM | POA: Diagnosis not present

## 2024-03-06 DIAGNOSIS — I480 Paroxysmal atrial fibrillation: Secondary | ICD-10-CM | POA: Diagnosis not present

## 2024-03-06 DIAGNOSIS — D649 Anemia, unspecified: Secondary | ICD-10-CM | POA: Diagnosis not present

## 2024-03-06 LAB — SURGICAL PATHOLOGY

## 2024-03-06 MED ORDER — PANTOPRAZOLE SODIUM 40 MG PO TBEC
40.0000 mg | DELAYED_RELEASE_TABLET | Freq: Every day | ORAL | Status: DC
  Administered 2024-03-07 – 2024-03-14 (×8): 40 mg via ORAL
  Filled 2024-03-06 (×8): qty 1

## 2024-03-06 MED ORDER — PANTOPRAZOLE SODIUM 40 MG PO TBEC: 40.0000 mg | DELAYED_RELEASE_TABLET | Freq: Two times a day (BID) | ORAL | Status: DC

## 2024-03-06 MED ORDER — EZETIMIBE 10 MG PO TABS
10.0000 mg | ORAL_TABLET | Freq: Every day | ORAL | Status: DC
  Administered 2024-03-06 – 2024-03-14 (×9): 10 mg via ORAL
  Filled 2024-03-06 (×9): qty 1

## 2024-03-06 NOTE — Progress Notes (Addendum)
 Progress Note  Patient Name: Shannon Scott Date of Encounter: 03/06/2024 [facility] Cardiologist: Vinie JAYSON Maxcy, MD   Interval Summary   Denies any chest pain or shortness of breath.  Sitting comfortably in bed  Vital Signs Vitals:   03/05/24 1606 03/05/24 2028 03/06/24 0024 03/06/24 0440  BP: 102/79 124/62 121/79 (!) 139/94  Pulse: 63   70  Resp: 20 20 20 20   Temp: 98.4 F (36.9 C) 98.1 F (36.7 C) 98.6 F (37 C) 98.4 F (36.9 C)  TempSrc: Oral Oral Oral Oral  SpO2: 98% 95% 97% 100%  Weight:    56.7 kg  Height:        Intake/Output Summary (Last 24 hours) at 03/06/2024 0751 Last data filed at 03/06/2024 0456 Gross per 24 hour  Intake 117 ml  Output 951 ml  Net -834 ml      03/06/2024    4:40 AM 03/05/2024    5:00 AM 03/04/2024    4:25 AM  Last 3 Weights  Weight (lbs) 125 lb 118 lb 14.4 oz 129 lb 3 oz  Weight (kg) 56.7 kg 53.933 kg 58.6 kg      Telemetry/ECG  Normal sinus rhythm with heart rates in the 50s to 70s- Personally Reviewed  Physical Exam  GEN: No acute distress.  Alert and orientated on room air. Neck: No JVD Cardiac: RRR, 3 out of 6 systolic murmur heard best on right upper sternal border Respiratory: Clear to auscultation bilaterally. GI: Has ostomy bag in place.  Brown stool present in ostomy bag.  Stated that abdomen is mildly tender MS: No edema.  Bandage on left lower extremity.  Assessment & Plan   Shannon Scott is a 83 y.o. female with a hx of CAD s/p CABG x 3 with LIMA-LAD, SVG-OM, SVG-RCA in 2005 and s/p DES to SVG-OM 2 in 03/2023, severe aortic stenosis, locally advanced endometrial cancer s/p chemoradiation, immunotherapy, hysterectomy with BSO, colonic mass with partial colectomy and colostomy, prior DVT, paroxysmal A-fib on eliquis , carotid artery stenosis, CVA, HTN, HLD, statin intolerance, lymphedema, asthma, hypothyroidism and who was seen for chest pain, and elevated troponins.   Acute blood loss anemia likely  secondary to diversion colitis Has had dark stools.  Required multiple blood transfusions.  Hemoglobin this a.m. was 9.4.  Because of this bleeding the patient's Eliquis  and plavix  was held.  Does not appear to have any blood in stools this morning.   CAD status post CABG on 03/2023 Elevated high-sensitivity troponins 104 > 91 > 142 > 308 Hyperlipidemia Elevated troponins most consistent with demand ischemia as in the setting of severe anemia as mentioned above. Restart home Zetia  10 mg daily Plan to restart home Repatha  after discharge.   Severe aortic stenosis HFpEF Pulmonary edema Pulmonary hypertension Echocardiogram on 03/01/24 showed a normal LVEF of 55 to 60%, no RWMA, with a reduced RV function, moderately elevated pulmonary artery systolic pressure, moderately dilated left atrium, mild to moderate MR, mild to moderate TR, severe AS with a mean gradient of 30, VTI of 0.75, DVI of 0.28, and V-max of 3.71. Chest x-ray on 03/02/2024 showed Mildly increased interstitial densities are noted bilaterally concerning for pulmonary edema. Small pleural effusions are noted. I's and O's are net out 2.3 L since admission but appear to be incomplete as documented intake for the last day was 0.1 L Will avoid nitroglycerin  due to severe AS. Is likely a poor candidate for TAVR procedure with multiple comorbidities.   New SVT  Paroxysmal atrial fibrillation Hypertension CHA2DS2-VASc Score = 8 [CHF History: 1, HTN History: 1, Diabetes History: 0, Stroke History: 2, Vascular Disease History: 1, Age Score: 2, Gender Score: 1].  Therefore, the patient's annual risk of stroke is 10.8 %.    On 10/3 developed SVT that broke with 5 mg IV metoprolol .  Remains in normal sinus rhythm this morning. Hold Eliquis .  May restart when cleared to do so by GI.  Eliquis  2.5 mg twice daily appears to be the correct dose for age and weight. Continue metoprolol  succinate 125 mg daily.   Otherwise management per  primary   For questions or updates, please contact [facility] Please consult [email or web address] for contact info under        Signed, Morse Clause, PA-C   Patient examined chart reviewed. Retired Engineer, civil (consulting). She does not want any cardiac w/u or TAVR evaluaiton. Off anticoagulation for severe anemia NSR on telemetry no PAF. Exam with AS murmur thin not volume overloaded lungs clear Holding eliquis    Maude Emmer MD [facility]

## 2024-03-06 NOTE — Progress Notes (Signed)
 PROGRESS NOTE  Shannon Scott  FMW:[id number] DOB: 05-17-1941 DOA: 02/29/2024 PCP: Elliot Charm, MD   Brief Narrative: Patient is 83 years female with history of hyperlipidemia, endometrial carcinoma, coronary artery disease status post CABG, paroxysmal A-fib on Eliquis , chronic HFpEF, lymphedema, colostomy, chronic anemia who presented for the evaluation of low hemoglobin as noted on outpatient blood work.  Patient was also having chest pressure triggered by exertion.  She was found to have a hemoglobin of 6.7.  Her stool was also noted to be dark.  On presentation, she was hemodynamically stable.  Lab work showed hemoglobin was 6.8, positive FOBT, troponin of 104.  Cardiology, GI consulted.  Given 2 units of blood transfusion. S/P EGD/colonoscopy on 10/3.  Hospital course also remarkable for development of A-fib with RVR, started on amiodarone  drip.  Currently remains in normal sinus rhythm.  PT evaluation done, recommended home health..  Possible discharge to home today or tomorrow  Assessment & Plan:  Principal Problem:   Symptomatic anemia Active Problems:   Endometrial cancer (HCC)   Hypothyroidism   PAF (paroxysmal atrial fibrillation) (HCC)   Nonrheumatic aortic valve stenosis   Coronary artery disease involving native coronary artery of native heart with angina pectoris   Elevated troponin   Chronic heart failure with preserved ejection fraction (HFpEF) (HCC)   GI bleeding  Symptomatic anemia/GI bleed: Presented with hemoglobin of 6.8 dark stool.  Given 2 units of blood transfusion.  Currently hemoglobin of 9. S/P  colonoscopy, EGD on 10/3 which showed 10 cm hiatal hernia, normal stomach/duodenum, erythematous mucosa in distal transverse colon, biopsy taken.  Started on mesalamine daily by GI.Eliquis /Plavix  on hold.  Stool on the colostomy bag appears less darker/ less red.GI taught  on  putting mesalamine enema through her colostomy.  Wound care nurse was consulted for  colostomy care.  Coronary artery disease/troponin elevation: Cardiology consulted and following.  She was following with cardiology as an outpatient and was planned for cardiac cath.  Troponins elevation Suspected to Be from Demand Ischemia.  Cardiology not planning for additional work up  at this time due to her anemia.  Currently recommending to discontinue Plavix  permanently.  Echocardiogram showed EF of 55 to 60%, no wall motion abnormality  History of severe AS: Echocardiogram showed mild aortic valve regurgitation, severe aortic stenosis.  Cardiology following.  Given few doses of  IV Lasix  here.  Currently appears euvolemic.  She declines further workup for her aortic stenosis  Paroxysmal A-fib: Monitor on telemetry.  Eliquis  on hold. Will be stopped on dc  and can be resumed as outpatient after cleared by cardiology and GI.  Went into A-fib with RVR on 10/3, started on amiodarone  drip,now  stopped.  Started on long-acting metoprolol .  She remains in normal sinus rhythm this morning  Chronic HFpEF: Given few days of IV Lasix .  Appears near euvolemic status  Hypertension: Currently blood pressure stable.  Monitor  History of endometrial carcinoma: Status post hysterectomy, radiation therapy, chemotherapy.  Follows with Dr. Timmy.  History of sigmoid colon obstruction, status post transverse loop colostomy.  Hypothyroidism: Continue Synthyroid  Hyperlipidemia: Takes Zetia  at home.  Debility/deconditioning: PT evaluation requested, recommended home health         DVT prophylaxis:SCDs Start: 03/01/24 0416     Code Status: Full Code  Family Communication: Discussed with  son  at bedside on 10/6  Patient status:Inpatient  Patient is from :Home  Anticipated discharge un:Ynfz with home health  Estimated DC date:today or tomorrow.  Patient  requesting more time to learn about putting mesalamine through the colostomy   Consultants:  Cardiology,GI  Procedures:EGD/colonoscopy  Antimicrobials:  Anti-infectives (From admission, onward)    None       Subjective: Patient seen and examined at bedside today.  Appears comfortable.  On room air.  Normal sinus rhythm.  No chest pain or shortness of breath or abdominal pain.  She was taught  about putting mesalamine through colostomy.  She will discuss with her son and daughter about going home soon.  Not confident if she can go home today  Objective: Vitals:   03/05/24 2028 03/06/24 0024 03/06/24 0440 03/06/24 0745  BP: 124/62 121/79 (!) 139/94 (!) 131/44  Pulse:   70   Resp: 20 20 20 18   Temp: 98.1 F (36.7 C) 98.6 F (37 C) 98.4 F (36.9 C) 98.3 F (36.8 C)  TempSrc: Oral Oral Oral Oral  SpO2: 95% 97% 100% 95%  Weight:   56.7 kg   Height:        Intake/Output Summary (Last 24 hours) at 03/06/2024 1100 Last data filed at 03/06/2024 0456 Gross per 24 hour  Intake 117 ml  Output 951 ml  Net -834 ml   Filed Weights   03/04/24 0425 03/05/24 0500 03/06/24 0440  Weight: 58.6 kg 53.9 kg 56.7 kg    Examination:  General exam: Overall comfortable, not in distress HEENT: PERRL Respiratory system:  no wheezes or crackles  Cardiovascular system: S1 & S2 heard, RRR.  Gastrointestinal system: Abdomen is nondistended, soft and nontender.  Colostomy Central nervous system: Alert and oriented Extremities: Left lower extremity lymphedema, no clubbing ,no cyanosis Skin: No rashes, no ulcers,no icterus        Data Reviewed: I have personally reviewed following labs and imaging studies  CBC: Recent Labs  Lab 02/29/24 2127 03/01/24 0552 03/01/24 1841 03/02/24 0305 03/02/24 1155 03/02/24 1742 03/03/24 1032 03/04/24 0231 03/05/24 1106  WBC 6.0 6.6  --  12.1*  --   --  10.7* 8.8 7.4  NEUTROABS 4.3  --   --   --   --   --   --   --   --   HGB 6.8* 7.5*   < > 9.8* 10.5* 10.7* 9.6* 9.2* 9.4*  HCT 22.9* 23.3*   < > 30.9* 32.5* 34.2* 30.2* 29.0* 30.3*  MCV 92.7  86.6  --  87.0  --   --  87.5 87.9 88.1  PLT 335 269  --  321  --   --  317 285 309   < > = values in this interval not displayed.   Basic Metabolic Panel: Recent Labs  Lab 03/01/24 0552 03/02/24 0305 03/03/24 1032 03/04/24 0231 03/05/24 1106  NA 138 140 139 137 136  K 3.5 3.9 3.2* 3.5 3.7  CL 106 102 104 101 102  CO2 25 25 27 26 27   GLUCOSE 94 127* 118* 117* 111*  BUN 18 14 16  24* 20  CREATININE 0.82 0.92 0.93 1.00 0.67  CALCIUM 8.2* 8.2* 7.9* 7.8* 8.2*  MG 1.6*  --   --   --   --      No results found for this or any previous visit (from the past 240 hours).   Radiology Studies: No results found.   Scheduled Meds:  sodium chloride    Intravenous Once   ezetimibe   10 mg Oral Daily   levothyroxine   50 mcg Oral Daily   mesalamine  4 g Rectal Daily   metoprolol   succinate  125 mg Oral Daily   pantoprazole  (PROTONIX ) IV  40 mg Intravenous Q12H   polyethylene glycol  17 g Oral Daily   sodium chloride  flush  3 mL Intravenous Q12H   triamcinolone  cream   Topical Daily   Continuous Infusions:     LOS: 5 days   Ivonne Mustache, MD [facility] P10/11/2023, 11:00 AM

## 2024-03-06 NOTE — Plan of Care (Signed)
  Problem: Education: Goal: Ability to identify signs and symptoms of gastrointestinal bleeding will improve Outcome: Progressing   Problem: Bowel/Gastric: Goal: Will show no signs and symptoms of gastrointestinal bleeding Outcome: Progressing   Problem: Fluid Volume: Goal: Will show no signs and symptoms of excessive bleeding Outcome: Progressing   

## 2024-03-07 ENCOUNTER — Inpatient Hospital Stay (HOSPITAL_COMMUNITY)

## 2024-03-07 ENCOUNTER — Inpatient Hospital Stay (HOSPITAL_BASED_OUTPATIENT_CLINIC_OR_DEPARTMENT_OTHER)
Admit: 2024-03-07 | Discharge: 2024-03-07 | Disposition: A | Discharge status: Home / Self Care | Attending: Student | Admitting: Student

## 2024-03-07 ENCOUNTER — Other Ambulatory Visit: Payer: Self-pay | Admitting: Student

## 2024-03-07 DIAGNOSIS — I471 Supraventricular tachycardia, unspecified: Secondary | ICD-10-CM

## 2024-03-07 DIAGNOSIS — I482 Chronic atrial fibrillation, unspecified: Secondary | ICD-10-CM

## 2024-03-07 DIAGNOSIS — D649 Anemia, unspecified: Secondary | ICD-10-CM | POA: Diagnosis not present

## 2024-03-07 DIAGNOSIS — I350 Nonrheumatic aortic (valve) stenosis: Secondary | ICD-10-CM | POA: Diagnosis not present

## 2024-03-07 DIAGNOSIS — I480 Paroxysmal atrial fibrillation: Secondary | ICD-10-CM | POA: Diagnosis not present

## 2024-03-07 MED ORDER — FUROSEMIDE 20 MG PO TABS
20.0000 mg | ORAL_TABLET | Freq: Once | ORAL | Status: AC
  Administered 2024-03-07: 20 mg via ORAL
  Filled 2024-03-07: qty 1

## 2024-03-07 MED ORDER — AMIODARONE HCL 100 MG PO TABS
100.0000 mg | ORAL_TABLET | Freq: Every day | ORAL | Status: DC
Start: 2024-03-07 — End: 2024-03-14
  Administered 2024-03-07 – 2024-03-14 (×8): 100 mg via ORAL
  Filled 2024-03-07 (×8): qty 1

## 2024-03-07 NOTE — Plan of Care (Signed)
  Problem: Education: Goal: Ability to identify signs and symptoms of gastrointestinal bleeding will improve Outcome: Progressing   Problem: Bowel/Gastric: Goal: Will show no signs and symptoms of gastrointestinal bleeding Outcome: Progressing   Problem: Fluid Volume: Goal: Will show no signs and symptoms of excessive bleeding Outcome: Progressing   

## 2024-03-07 NOTE — Progress Notes (Signed)
 PROGRESS NOTE  Shannon Scott  FMW:[id number] DOB: 26-May-1941 DOA: 02/29/2024 PCP: Elliot Charm, MD   Brief Narrative: Patient is 83 years female with history of hyperlipidemia, endometrial carcinoma, coronary artery disease status post CABG, paroxysmal A-fib on Eliquis , chronic HFpEF, lymphedema, colostomy, chronic anemia who presented for the evaluation of low hemoglobin as noted on outpatient blood work.  Patient was also having chest pressure triggered by exertion.  She was found to have a hemoglobin of 6.7.  Her stool was also noted to be dark.  On presentation, she was hemodynamically stable.  Lab work showed hemoglobin was 6.8, positive FOBT, troponin of 104.  Cardiology, GI consulted.  Given 2 units of blood transfusion. S/P EGD/colonoscopy on 10/3.  Hospital course also remarkable for development of A-fib with RVR, started on amiodarone  drip.  Currently remains in normal sinus rhythm.  PT evaluation done, recommended home health.  Hospital course remarkable for persistent pain, bloating and colostomy site.  Patient does not feel ready to go home and requesting GI evaluation.  GI reconsulted today  Assessment & Plan:  Principal Problem:   Symptomatic anemia Active Problems:   Endometrial cancer (HCC)   Hypothyroidism   PAF (paroxysmal atrial fibrillation) (HCC)   Nonrheumatic aortic valve stenosis   Coronary artery disease involving native coronary artery of native heart with angina pectoris   Elevated troponin   Chronic heart failure with preserved ejection fraction (HFpEF) (HCC)   GI bleeding  Symptomatic anemia/GI bleed: Presented with hemoglobin of 6.8 dark stool.  Given 2 units of blood transfusion.  Currently hemoglobin of 9. S/P  colonoscopy, EGD on 10/3 which showed 10 cm hiatal hernia, normal stomach/duodenum, erythematous mucosa in distal transverse colon, biopsy taken.  Started on mesalamine daily by GI.Eliquis /Plavix  on hold.  Stool on the colostomy bag appears  less darker/ less red.GI [facility] nurse taught  on  putting mesalamine enema through her colostomy. But patient continues to have bloating/discomfort on the ostomy site.  Requested reevaluation by GI and ostomy nurse today.  Coronary artery disease/troponin elevation: Cardiology consulted and following.  She was following with cardiology as an outpatient and was planned for cardiac cath.  Troponins elevation Suspected to Be from Demand Ischemia.  Cardiology not planning for additional work up  at this time due to her anemia.  Currently recommending to discontinue Plavix  permanently.  Echocardiogram showed EF of 55 to 60%, no wall motion abnormality  History of severe AS: Echocardiogram showed mild aortic valve regurgitation, severe aortic stenosis.  Cardiology following.  Given few doses of  IV Lasix  here.  Currently appears euvolemic.  She declines further workup for her aortic stenosis  Paroxysmal A-fib: Monitor on telemetry.  Eliquis  on hold. Can be resumed as outpatient after cleared by cardiology and GI.  Went into A-fib with RVR on 10/3, started on amiodarone  drip,now  stopped.  Started on long-acting metoprolol .  She remains in normal sinus rhythm this morning.  Cardiology arranged for today outpatient cardiac monitor.  Chronic HFpEF: Given few days of IV Lasix .  Appears near euvolemic status, given a dose of oral Lasix  this morning.  Hypertension: Currently blood pressure stable.  Monitor  History of endometrial carcinoma: Status post hysterectomy, radiation therapy, chemotherapy.  Follows with Dr. Timmy.  History of sigmoid colon obstruction, status post transverse loop colostomy.  Hypothyroidism: Continue Synthyroid  Hyperlipidemia: Takes Zetia  at home.  Debility/deconditioning: PT evaluation requested, recommended home health         DVT prophylaxis:SCDs Start: 03/01/24 0416  Code Status: Full Code  Family Communication: Discussed with  son  at bedside on 10/8  Patient  status:Inpatient  Patient is from :Home  Anticipated discharge un:Ynfz with home health  Estimated DC date: Likely tomorrow.  Waiting for GI evaluation for discomfort on the ostomy site   Consultants: Cardiology,GI  Procedures:EGD/colonoscopy  Antimicrobials:  Anti-infectives (From admission, onward)    None       Subjective: Patient seen and examined at the bedside today.  Hemodynamically stable.  On normal sinus rhythm.  Complains of pain/discomfort on the colostomy site.  Does not feel ready to go home.  No nausea or vomiting.  Abdomen benign on examination.  We discussed about reengaging gastroenterology for evaluation of her colostomy  Objective: Vitals:   03/07/24 0303 03/07/24 0825 03/07/24 0926 03/07/24 1124  BP: (!) 156/76 (!) 159/70  (!) 148/66  Pulse: 77 76 77 72  Resp: 20 (!) 23 20 19   Temp: 98.4 F (36.9 C) 98.7 F (37.1 C)  98.4 F (36.9 C)  TempSrc: Oral Oral  Oral  SpO2: 99% 92% 95% 95%  Weight: 56.6 kg     Height:        Intake/Output Summary (Last 24 hours) at 03/07/2024 1353 Last data filed at 03/07/2024 0500 Gross per 24 hour  Intake 300 ml  Output 1550 ml  Net -1250 ml   Filed Weights   03/05/24 0500 03/06/24 0440 03/07/24 0303  Weight: 53.9 kg 56.7 kg 56.6 kg    Examination:  General exam: Overall comfortable, not in distress HEENT: PERRL Respiratory system:  no wheezes or crackles  Cardiovascular system: S1 & S2 heard, RRR.  Gastrointestinal system: Abdomen is nondistended, soft and nontender.  Colostomy site with empty bag, surrounding bloating Central nervous system: Alert and oriented Extremities: Chronic LLE edema, no clubbing ,no cyanosis Skin: No rashes, no ulcers,no icterus        Data Reviewed: I have personally reviewed following labs and imaging studies  CBC: Recent Labs  Lab 02/29/24 2127 03/01/24 0552 03/01/24 1841 03/02/24 0305 03/02/24 1155 03/02/24 1742 03/03/24 1032 03/04/24 0231 03/05/24 1106  WBC 6.0  6.6  --  12.1*  --   --  10.7* 8.8 7.4  NEUTROABS 4.3  --   --   --   --   --   --   --   --   HGB 6.8* 7.5*   < > 9.8* 10.5* 10.7* 9.6* 9.2* 9.4*  HCT 22.9* 23.3*   < > 30.9* 32.5* 34.2* 30.2* 29.0* 30.3*  MCV 92.7 86.6  --  87.0  --   --  87.5 87.9 88.1  PLT 335 269  --  321  --   --  317 285 309   < > = values in this interval not displayed.   Basic Metabolic Panel: Recent Labs  Lab 03/01/24 0552 03/02/24 0305 03/03/24 1032 03/04/24 0231 03/05/24 1106  NA 138 140 139 137 136  K 3.5 3.9 3.2* 3.5 3.7  CL 106 102 104 101 102  CO2 25 25 27 26 27   GLUCOSE 94 127* 118* 117* 111*  BUN 18 14 16  24* 20  CREATININE 0.82 0.92 0.93 1.00 0.67  CALCIUM 8.2* 8.2* 7.9* 7.8* 8.2*  MG 1.6*  --   --   --   --      No results found for this or any previous visit (from the past 240 hours).   Radiology Studies: DG CHEST PORT 1 VIEW Result Date: 03/07/2024  CLINICAL DATA:  Follow-up CHF.  Shortness of breath. EXAM: PORTABLE CHEST 1 VIEW COMPARISON:  03/02/2024.  Chest CTA dated 03/04/2023. FINDINGS: Stable enlarged cardiac silhouette and prominent pulmonary vasculature and interstitial markings with Kerley lines. Stable small bilateral pleural effusions and mild patchy right upper lobe opacity. Stable post CABG changes and right jugular catheter with its tip in the superior vena cava. Diffuse osteopenia. IMPRESSION: 1. Stable congestive heart failure with small bilateral pleural effusions. 2. Stable mild patchy right upper lobe opacity. This could represent pneumonia or alveolar edema. Electronically Signed   By: Elspeth Bathe M.D.   On: 03/07/2024 12:37     Scheduled Meds:  sodium chloride    Intravenous Once   amiodarone   100 mg Oral Daily   ezetimibe   10 mg Oral Daily   levothyroxine   50 mcg Oral Daily   mesalamine  4 g Rectal Daily   metoprolol  succinate  125 mg Oral Daily   pantoprazole   40 mg Oral Daily   polyethylene glycol  17 g Oral Daily   sodium chloride  flush  3 mL Intravenous Q12H    triamcinolone  cream   Topical Daily   Continuous Infusions:     LOS: 6 days   Ivonne Mustache, MD [facility] P10/12/2023, 1:53 PM

## 2024-03-07 NOTE — Plan of Care (Signed)
  Problem: Bowel/Gastric: Goal: Will show no signs and symptoms of gastrointestinal bleeding Outcome: Progressing   Problem: Fluid Volume: Goal: Will show no signs and symptoms of excessive bleeding Outcome: Progressing   Problem: Clinical Measurements: Goal: Complications related to the disease process, condition or treatment will be avoided or minimized Outcome: Progressing   Problem: Education: Goal: Knowledge of General Education information will improve Description: Including pain rating scale, medication(s)/side effects and non-pharmacologic comfort measures Outcome: Progressing   Problem: Clinical Measurements: Goal: Ability to maintain clinical measurements within normal limits will improve Outcome: Progressing Goal: Will remain free from infection Outcome: Progressing Goal: Diagnostic test results will improve Outcome: Progressing Goal: Respiratory complications will improve Outcome: Progressing Goal: Cardiovascular complication will be avoided Outcome: Progressing   Problem: Activity: Goal: Risk for activity intolerance will decrease Outcome: Progressing   Problem: Nutrition: Goal: Adequate nutrition will be maintained Outcome: Progressing   Problem: Elimination: Goal: Will not experience complications related to bowel motility Outcome: Progressing Goal: Will not experience complications related to urinary retention Outcome: Progressing   Problem: Pain Managment: Goal: General experience of comfort will improve and/or be controlled Outcome: Progressing   Problem: Safety: Goal: Ability to remain free from injury will improve Outcome: Progressing   Problem: Skin Integrity: Goal: Risk for impaired skin integrity will decrease Outcome: Progressing

## 2024-03-07 NOTE — Progress Notes (Signed)
 ZIO AT to be applied at hospital. Dr. Mona to read.

## 2024-03-07 NOTE — Progress Notes (Signed)
 Progress Note  Patient Name: Shannon Scott Date of Encounter: 03/07/2024 [facility] Cardiologist: Vinie JAYSON Maxcy, MD   Interval Summary   Complains of LLQ pain lateral to ostomy. Increased dyspnea   Vital Signs Vitals:   03/06/24 1609 03/06/24 2005 03/06/24 2349 03/07/24 0303  BP: (!) 141/68 (!) 148/69 (!) 130/48 (!) 156/76  Pulse: 73 71 70 77  Resp: (!) 23 20 20 20   Temp: 98 F (36.7 C) 98.5 F (36.9 C) 97.8 F (36.6 C) 98.4 F (36.9 C)  TempSrc: Oral Oral Oral Oral  SpO2: 94% 95% 97% 99%  Weight:    56.6 kg  Height:        Intake/Output Summary (Last 24 hours) at 03/07/2024 9175 Last data filed at 03/07/2024 0500 Gross per 24 hour  Intake 300 ml  Output 1850 ml  Net -1550 ml      03/07/2024    3:03 AM 03/06/2024    4:40 AM 03/05/2024    5:00 AM  Last 3 Weights  Weight (lbs) 124 lb 12.5 oz 125 lb 118 lb 14.4 oz  Weight (kg) 56.6 kg 56.7 kg 53.933 kg      Telemetry/ECG  Normal sinus rhythm with heart rates in the 50s to 70s- Personally Reviewed  Physical Exam   Pale elderly female Not tachypnic sats 96% Ostomy with mild LLQ pain to palpation AS murmur Tunneled catheter right subclavian No edema Basilar crackles   Assessment & Plan   Shannon Scott is a 83 y.o. female with a hx of CAD s/p CABG x 3 with LIMA-LAD, SVG-OM, SVG-RCA in 2005 and s/p DES to SVG-OM 2 in 03/2023, severe aortic stenosis, locally advanced endometrial cancer s/p chemoradiation, immunotherapy, hysterectomy with BSO, colonic mass with partial colectomy and colostomy, prior DVT, paroxysmal A-fib on eliquis , carotid artery stenosis, CVA, HTN, HLD, statin intolerance, lymphedema, asthma, hypothyroidism and who was seen for chest pain, and elevated troponins.   Acute blood loss anemia likely secondary to diversion colitis Has had dark stools.  Required multiple blood transfusions.  Hemoglobin 10/6 was 9.4.  Because of this bleeding the patient's Eliquis  and plavix  was held.   Does not appear to have any blood in stools this morning.   CAD status post CABG on 03/2023 Elevated high-sensitivity troponins 104 > 91 > 142 > 308 Hyperlipidemia Elevated troponins most consistent with demand ischemia as in the setting of severe anemia as mentioned above. Zetia  10 mg daily Plan to restart home Repatha  after discharge.   Severe aortic stenosis HFpEF Pulmonary edema Pulmonary hypertension Echocardiogram on 03/01/24 showed a normal LVEF of 55 to 60%, no RWMA, with a reduced RV function, moderately elevated pulmonary artery systolic pressure, moderately dilated left atrium, mild to moderate MR, mild to moderate TR, severe AS with a mean gradient of 30, VTI of 0.75, DVI of 0.28, and V-max of 3.71. Chest x-ray on 03/02/2024 showed Mildly increased interstitial densities are noted bilaterally concerning for pulmonary edema. Small pleural effusions are noted. I's and O's are net out 1.5  L yesterday Will avoid nitroglycerin  due to severe AS. Is likely a poor candidate for TAVR procedure with multiple comorbidities. Check CXR this am with dyspnea. Multifactorial with CHF, AS pain and anemia One dosse lasix  20 mg oral this am    New SVT Paroxysmal atrial fibrillation Hypertension CHA2DS2-VASc Score = 8 [CHF History: 1, HTN History: 1, Diabetes History: 0, Stroke History: 2, Vascular Disease History: 1, Age Score: 2, Gender Score: 1].  Therefore,  the patient's annual risk of stroke is 10.8 %.    On 10/3 developed SVT that broke with 5 mg IV metoprolol .  Remains in normal sinus rhythm this morning. Hold Eliquis .  May restart when cleared to do so by GI.  Eliquis  2.5 mg twice daily appears to be the correct dose for age and weight. Continue metoprolol  succinate 125 mg daily. Low dose amiodarone  100 mg daily NSR now   Otherwise management per primary  Consider f/u KUB given LLQ pain    For questions or updates, please contact [facility] Please consult [email or web address]  for contact info under        Signed, Maude Emmer, MD

## 2024-03-07 NOTE — Consult Note (Signed)
 WOC Nurse ostomy consult note Reconsulted due to pain and bloating around ostomy.  Is receiving daily stomal mesalamine enemas.  2 piece ostomy supplies have been ordered for discharge home as she uses 1 piece pouch and would not be able to remove pouch to administer enema without stripping skin daily to remove appliance.   Stoma type/location: LMQ colostomy with parastomal hernia  Stomal assessment/size: 1.5 cm x 4 cm flush stoma Peristomal assessment: rounded hernia pain and tenderness in this area today. Noted erythematous mucosa in distal colon on colonoscopy.  Treatment options for stomal/peristomal skin: 2 piece convex pouch with barrier ring   Supplies have been ordered and are bedside now. Pouch is intact.  Output She is not having stool output still.  Son is asking for GI team to weigh in on this. Bedside RN aware.  Ostomy pouching: 2pc.  Education provided: None new.  She is having none to scant amount of stooling. Bedside RN to make GI team aware.  Will not follow at this time.  Please re-consult if needed.  Darice Cooley MSN, RN, FNP-BC CWON Wound, Ostomy, Continence Nurse Outpatient [facility] [phone] Work cell phone:  [phone]

## 2024-03-07 NOTE — Progress Notes (Signed)
 14 day monitor for SVT and atrial fibrillation per Dr Delford. Dr Mona to read.

## 2024-03-08 ENCOUNTER — Inpatient Hospital Stay

## 2024-03-08 ENCOUNTER — Inpatient Hospital Stay: Admitting: Hematology & Oncology

## 2024-03-08 DIAGNOSIS — D649 Anemia, unspecified: Secondary | ICD-10-CM | POA: Diagnosis not present

## 2024-03-08 DIAGNOSIS — I350 Nonrheumatic aortic (valve) stenosis: Secondary | ICD-10-CM | POA: Diagnosis not present

## 2024-03-08 MED ORDER — APIXABAN 2.5 MG PO TABS
2.5000 mg | ORAL_TABLET | Freq: Two times a day (BID) | ORAL | Status: DC
  Administered 2024-03-09 – 2024-03-14 (×11): 2.5 mg via ORAL
  Filled 2024-03-08 (×11): qty 1

## 2024-03-08 MED ORDER — FUROSEMIDE 20 MG PO TABS
20.0000 mg | ORAL_TABLET | Freq: Once | ORAL | Status: AC
  Administered 2024-03-08: 20 mg via ORAL
  Filled 2024-03-08: qty 1

## 2024-03-08 MED ORDER — METOPROLOL SUCCINATE ER 100 MG PO TB24
100.0000 mg | ORAL_TABLET | Freq: Every day | ORAL | Status: DC
  Administered 2024-03-08 – 2024-03-10 (×3): 100 mg via ORAL
  Filled 2024-03-08 (×3): qty 1

## 2024-03-08 NOTE — Progress Notes (Signed)
 Subjective: I was called by the hospitalist to see the patient as her son felt she was in excruciating at the site of the ostomy pain and wanted me to evaluate her. He felt someone had caused an acute injury while instilling the enemas. The nurse asked me if I could administer the enema. Her son Norleen has been reading up possible reasons for the abdominal pain online and swears he has never seen his mother in such excruciating pain.  Objective: Vital signs in last 24 hours: Temp:  [98 F (36.7 C)-98.7 F (37.1 C)] 98.2 F (36.8 C) (10/09 0438) Pulse Rate:  [64-77] 67 (10/09 0438) Resp:  [19-23] 19 (10/09 0438) BP: (125-159)/(54-75) 138/54 (10/09 0438) SpO2:  [92 %-98 %] 96 % (10/09 0438) Weight:  [56.6 kg] 56.6 kg (10/09 0548) Last BM Date : 03/07/24  Intake/Output from previous day: 10/08 0701 - 10/09 0700 In: 240 [P.O.:240] Out: 1000 [Urine:600; Stool:400] Intake/Output this shift: No intake/output data recorded.  General appearance: alert, appears stated age, fatigued, and no distress, very irritable; she said she did not me to administer the enema; she just wanted to go home Resp: clear to auscultation bilaterally Cardio: regular rate and rhythm, S1, S2 normal, no murmur, click, rub or gallop GI: soft, non-tender; bowel sounds normal; no masses,  no organomegaly; ostomy LLQ full of stool   Lab Results: Recent Labs    03/05/24 1106  WBC 7.4  HGB 9.4*  HCT 30.3*  PLT 309   BMET Recent Labs    03/05/24 1106  NA 136  K 3.7  CL 102  CO2 27  GLUCOSE 111*  BUN 20  CREATININE 0.67  CALCIUM 8.2*   Studies/Results: DG CHEST PORT 1 VIEW Result Date: 03/07/2024 CLINICAL DATA:  Follow-up CHF.  Shortness of breath. EXAM: PORTABLE CHEST 1 VIEW COMPARISON:  03/02/2024.  Chest CTA dated 03/04/2023. FINDINGS: Stable enlarged cardiac silhouette and prominent pulmonary vasculature and interstitial markings with Kerley lines. Stable small bilateral pleural effusions and mild patchy  right upper lobe opacity. Stable post CABG changes and right jugular catheter with its tip in the superior vena cava. Diffuse osteopenia. IMPRESSION: 1. Stable congestive heart failure with small bilateral pleural effusions. 2. Stable mild patchy right upper lobe opacity. This could represent pneumonia or alveolar edema. Electronically Signed   By: Elspeth Bathe M.D.   On: 03/07/2024 12:37   Medications: I have reviewed the patient's current medications. Prior to Admission:  Medications Prior to Admission  Medication Sig Dispense Refill Last Dose/Taking   apixaban  (ELIQUIS ) 5 MG TABS tablet Take 1 tablet (5 mg total) by mouth 2 (two) times daily. 180 tablet 3 02/29/2024 at  6:00 PM   clopidogrel  (PLAVIX ) 75 MG tablet Take 1 tablet (75 mg total) by mouth daily with breakfast. 30 tablet 4 02/29/2024 at  8:00 AM   Cyanocobalamin  (VITAMIN B-12) 5000 MCG LOZG Take 1 each by mouth in the morning.   02/29/2024 at  8:00 AM   Evolocumab  (REPATHA  SURECLICK) 140 MG/ML SOAJ INJECT 1 DOSE UNDER THE SKIN EVERY 14 DAYS 6 mL 1 02/22/2024   ezetimibe  (ZETIA ) 10 MG tablet Take 10 mg by mouth daily.   02/29/2024 at  6:00 PM   furosemide  (LASIX ) 20 MG tablet Take 1 tablet (20 mg total) by mouth every Monday, Wednesday, and Friday. 36 tablet 3 02/29/2024   levothyroxine  (SYNTHROID ) 50 MCG tablet Take 1 tablet (50 mcg total) by mouth daily. 90 tablet 3 02/29/2024 Morning   Multiple Vitamins-Minerals (ONE  A DAY WOMEN 50 PLUS) CHEW Chew 1 each by mouth daily at 6 PM. One-A-Day Women's 50+ Gummies;  Chew one gummy by mouth every evening at 1800.   02/29/2024 at  6:00 PM   polyethylene glycol (MIRALAX  / GLYCOLAX ) 17 g packet Take 17 g by mouth daily as needed for severe constipation. (Patient taking differently: Take 17 g by mouth daily.) 14 each 0 02/29/2024 at  8:00 AM   TIADYLT  ER 300 MG 24 hr capsule Take 300 mg by mouth daily.   02/29/2024 at  6:00 PM   traMADol  (ULTRAM ) 50 MG tablet TAKE 2 TABLETS(100 MG) BY MOUTH TWICE DAILY 120  tablet 0 02/29/2024 at  6:00 PM   nitroGLYCERIN  (NITROSTAT ) 0.4 MG SL tablet Place 1 tablet (0.4 mg total) under the tongue every 5 (five) minutes as needed for chest pain. (Patient not taking: Reported on 03/01/2024) 25 tablet 0 Not Taking   Scheduled:  sodium chloride    Intravenous Once   amiodarone   100 mg Oral Daily   ezetimibe   10 mg Oral Daily   levothyroxine   50 mcg Oral Daily   mesalamine  4 g Rectal Daily   metoprolol  succinate  125 mg Oral Daily   pantoprazole   40 mg Oral Daily   polyethylene glycol  17 g Oral Daily   sodium chloride  flush  3 mL Intravenous Q12H   triamcinolone  cream   Topical Daily   Continuous: PRN:acetaminophen  **OR** acetaminophen , ondansetron  **OR** ondansetron  (ZOFRAN ) IV, traMADol   Assessment/Plan: 1) Diversion colitis with iron  deficiency anemia-follow-up in the office within the next 1 to 2 weeks to monitor her CBCs closely and make further recommendations accordingly. I had a lengthy discussion with the patient and her son who is at the bedside about the need for administering the mesalamine enemas and showed them how this can be done Shortly after I examined the patient she said she could not go home. She was not ready to so. Its been a very difficulty situation.   2) Large hiatal hernia. 3)Coronary artery disease with elevated troponins on admission/severe aortic stenosis 4) Paroxysmal atrial fibrillation. 5) Chronic heart failure with preserved ejection fraction 6) Hypertension/hyperlipidemia 7) History of endometrial carcinoma 8) Hypothyroidism.     LOS: 7 days   Renaye Sous 03/08/2024, 7:53 AM

## 2024-03-08 NOTE — Progress Notes (Signed)
 Physical Therapy Treatment Patient Details Name: Shannon Scott MRN: [id number] DOB: 1940-09-16 Today's Date: 03/08/2024   History of Present Illness 83 yo F adm 02/29/24 for low Hgb with anemia, GIB. PMHx: CAD s/p CABG, severe aortic stenosis, endometrial CA, hysterectomy, colostomy, Afib, HTN, HLD, HFpEF, lymphedema, asthma    PT Comments  Pt pleasant and reports SOB despite SPO2 97% on RA throughout session with activity. Pt walking laps in room per her preference but states she has not walked since last session due to purewick and ostomy with pt encouraged to be OOB, walking and moving acutely despite these devices. Pt states she cannot get to [facility] quickly enough on lasix . Pt educated for seated HEP and sitting EOB end of session.   HR 64-74 SPO2 97% on RA    If plan is discharge home, recommend the following: A little help with bathing/dressing/bathroom;Assistance with cooking/housework;Assist for transportation;Help with stairs or ramp for entrance   Can travel by private vehicle        Equipment Recommendations  None recommended by PT    Recommendations for Other Services       Precautions / Restrictions Precautions Precautions: Fall;Other (comment) Recall of Precautions/Restrictions: Intact Precaution/Restrictions Comments: ostomy, port R upper chest     Mobility  Bed Mobility Overal bed mobility: Modified Independent       Supine to sit: HOB elevated, Used rails, Supervision     General bed mobility comments: HOB 20 degrees, rails, increased time    Transfers Overall transfer level: Modified independent                 General transfer comment: pt able to rise from EOB without assist    Ambulation/Gait Ambulation/Gait assistance: Supervision Gait Distance (Feet): 175 Feet Assistive device: Rolling walker (2 wheels) Gait Pattern/deviations: Step-through pattern, Decreased stride length   Gait velocity interpretation: 1.31 - 2.62 ft/sec, indicative  of limited community ambulator   General Gait Details: slow, steady gait with RW, preferred laps in room   Stairs             Wheelchair Mobility     Tilt Bed    Modified Rankin (Stroke Patients Only)       Balance Overall balance assessment: Needs assistance Sitting-balance support: No upper extremity supported, Feet supported Sitting balance-Leahy Scale: Good     Standing balance support: Bilateral upper extremity supported, During functional activity, Reliant on assistive device for balance Standing balance-Leahy Scale: Poor Standing balance comment: RW for gait                            Communication Communication Communication: No apparent difficulties  Cognition Arousal: Alert Behavior During Therapy: WFL for tasks assessed/performed   PT - Cognitive impairments: No apparent impairments                         Following commands: Intact      Cueing Cueing Techniques: Verbal cues  Exercises General Exercises - Lower Extremity Long Arc Quad: AROM, Both, 15 reps, Seated, Strengthening Hip Flexion/Marching: AROM, Both, 10 reps, Seated, Strengthening    General Comments        Pertinent Vitals/Pain Pain Assessment Pain Assessment: 0-10 Pain Score: 2  Pain Location: abdomen Pain Descriptors / Indicators: Sore, Guarding Pain Intervention(s): Limited activity within patient's tolerance, Monitored during session, Repositioned    Home Living  Prior Function            PT Goals (current goals can now be found in the care plan section) Progress towards PT goals: Progressing toward goals    Frequency    Min 1X/week      PT Plan      Co-evaluation              AM-PAC PT 6 Clicks Mobility   Outcome Measure  Help needed turning from your back to your side while in a flat bed without using bedrails?: None Help needed moving from lying on your back to sitting on the side of a  flat bed without using bedrails?: None Help needed moving to and from a bed to a chair (including a wheelchair)?: A Little Help needed standing up from a chair using your arms (e.g., wheelchair or bedside chair)?: A Little Help needed to walk in hospital room?: A Little Help needed climbing 3-5 steps with a railing? : A Little 6 Click Score: 20    End of Session   Activity Tolerance: Patient tolerated treatment well Patient left: in bed;with call bell/phone within reach;with family/visitor present (pt declined OOB to chair, EOB end of session) Nurse Communication: Mobility status PT Visit Diagnosis: Difficulty in walking, not elsewhere classified (R26.2);Muscle weakness (generalized) (M62.81)     Time: 8973-8944 PT Time Calculation (min) (ACUTE ONLY): 29 min  Charges:    $Gait Training: 8-22 mins $Therapeutic Activity: 8-22 mins PT General Charges $$ ACUTE PT VISIT: 1 Visit                     Lenoard SQUIBB, PT Acute Rehabilitation Services Office: [phone]    Ilse Billman B Keena Dinse 03/08/2024, 11:40 AM

## 2024-03-08 NOTE — Progress Notes (Signed)
 Progress Note  Patient Name: Shannon Scott Date of Encounter: 03/08/2024 [facility] Cardiologist: Vinie JAYSON Maxcy, MD   Interval Summary   Still complains of LLQ pain and difficulty taking deep breath   Vital Signs Vitals:   03/08/24 0438 03/08/24 0548 03/08/24 0745 03/08/24 0856  BP: (!) 138/54  (!) 142/73 134/64  Pulse: 67  66 64  Resp: 19  19 14   Temp: 98.2 F (36.8 C)  98.1 F (36.7 C) 98.4 F (36.9 C)  TempSrc: Oral  Oral Oral  SpO2: 96%  99% 98%  Weight:  56.6 kg    Height:        Intake/Output Summary (Last 24 hours) at 03/08/2024 0914 Last data filed at 03/08/2024 0600 Gross per 24 hour  Intake 240 ml  Output 1000 ml  Net -760 ml      03/08/2024    5:48 AM 03/07/2024    3:03 AM 03/06/2024    4:40 AM  Last 3 Weights  Weight (lbs) 124 lb 12.8 oz 124 lb 12.5 oz 125 lb  Weight (kg) 56.609 kg 56.6 kg 56.7 kg      Telemetry/ECG  Normal sinus rhythm with heart rates in the 50s to 70s- Personally Reviewed  Physical Exam   Pale elderly female Not tachypnic sats 96% Ostomy with mild LLQ pain to palpation AS murmur Tunneled catheter right subclavian No edema Basilar crackles   Assessment & Plan   Shannon Scott is a 83 y.o. female with a hx of CAD s/p CABG x 3 with LIMA-LAD, SVG-OM, SVG-RCA in 2005 and s/p DES to SVG-OM 2 in 03/2023, severe aortic stenosis, locally advanced endometrial cancer s/p chemoradiation, immunotherapy, hysterectomy with BSO, colonic mass with partial colectomy and colostomy, prior DVT, paroxysmal A-fib on eliquis , carotid artery stenosis, CVA, HTN, HLD, statin intolerance, lymphedema, asthma, hypothyroidism and who was seen for chest pain, and elevated troponins.   Acute blood loss anemia likely secondary to diversion colitis Has had dark stools.  Required multiple blood transfusions.  Hemoglobin 10/6 was 9.4.  Because of this bleeding the patient's Eliquis  and plavix  was held.  Does not appear to have any blood in  stools this morning.   CAD status post CABG on 03/2023 Elevated high-sensitivity troponins 104 > 91 > 142 > 308 Hyperlipidemia Elevated troponins most consistent with demand ischemia as in the setting of severe anemia as mentioned above. Zetia  10 mg daily Plan to restart home Repatha  after discharge.   Severe aortic stenosis HFpEF Pulmonary edema Pulmonary hypertension Echocardiogram on 03/01/24 showed a normal LVEF of 55 to 60%, no RWMA, with a reduced RV function, moderately elevated pulmonary artery systolic pressure, moderately dilated left atrium, mild to moderate MR, mild to moderate TR, severe AS with a mean gradient of 30, VTI of 0.75, DVI of 0.28, and V-max of 3.71. Chest x-ray on 03/02/2024 showed Mildly increased interstitial densities are noted bilaterally concerning for pulmonary edema. Small pleural effusions are noted. I's and O's are net out 1.5  L yesterday Will avoid nitroglycerin  due to severe AS. Is likely a poor candidate for TAVR procedure with multiple comorbidities.  Multifactorial with CHF, AS pain and anemia CXR improved stabel mild CHF patchy RUL opacity  Will give lasix  20 mg oral again today check BNP   New SVT Paroxysmal atrial fibrillation Hypertension CHA2DS2-VASc Score = 8 [CHF History: 1, HTN History: 1, Diabetes History: 0, Stroke History: 2, Vascular Disease History: 1, Age Score: 2, Gender Score: 1].  Therefore,  the patient's annual risk of stroke is 10.8 %.    On 10/3 developed SVT that broke with 5 mg IV metoprolol .  Remains in normal sinus rhythm this morning. Hold Eliquis .  May restart when cleared to do so by GI.  Eliquis  2.5 mg twice daily appears to be the correct dose for age and weight. Continue metoprolol  succinate 125 mg daily. Low dose amiodarone  100 mg daily NSR now 14 day monitor apply on d/c    Otherwise management per primary  Consider f/u KUB given LLQ pain    For questions or updates, please contact [facility] Please consult [email or web address] for contact info under        Signed, Maude Emmer, MD

## 2024-03-08 NOTE — Progress Notes (Signed)
 PROGRESS NOTE  Shannon Scott  FMW:[id number] DOB: 1941/01/25 DOA: 02/29/2024 PCP: Elliot Charm, MD   Brief Narrative: Patient is 83 years female with history of hyperlipidemia, endometrial carcinoma, coronary artery disease status post CABG, paroxysmal A-fib on Eliquis , chronic HFpEF, lymphedema, colostomy, chronic anemia who presented for the evaluation of low hemoglobin as noted on outpatient blood work.  Patient was also having chest pressure triggered by exertion.  She was found to have a hemoglobin of 6.7.  Her stool was also noted to be dark.  On presentation, she was hemodynamically stable.  Lab work showed hemoglobin was 6.8, positive FOBT, troponin of 104.  Cardiology, GI consulted.  Given 2 units of blood transfusion. S/P EGD/colonoscopy on 10/3.  Hospital course also remarkable for development of A-fib with RVR, started on amiodarone  drip.  Currently remains in normal sinus rhythm.  PT evaluation done, recommended home health.  Hospital course remarkable for persistent pain, bloating and colostomy site.  Patient does not feel ready to go home again .  GI and cardiology have cleared for discharge. She thinks that she may be able to go home tomorrow  Assessment & Plan:  Principal Problem:   Symptomatic anemia Active Problems:   Endometrial cancer (HCC)   Hypothyroidism   PAF (paroxysmal atrial fibrillation) (HCC)   Nonrheumatic aortic valve stenosis   Coronary artery disease involving native coronary artery of native heart with angina pectoris   Elevated troponin   Chronic heart failure with preserved ejection fraction (HFpEF) (HCC)   GI bleeding  Symptomatic anemia/GI bleed: Presented with hemoglobin of 6.8 dark stool.  Given 2 units of blood transfusion.  Currently hemoglobin of 9. S/P  colonoscopy, EGD on 10/3 which showed 10 cm hiatal hernia, normal stomach/duodenum, erythematous mucosa in distal transverse colon, biopsy taken.  Started on mesalamine daily by  GI.Eliquis /Plavix  on hold.  Stool on the colostomy bag appears less darker/ less red.GI [facility] nurse taught  on  putting mesalamine enema through her colostomy. But patient continued to have bloating/discomfort on the ostomy site.  Dr Rollin saw her today and recommended mesalamine enema once or twice a week  Coronary artery disease/troponin elevation: Cardiology consulted and following.  She was following with cardiology as an outpatient and was planned for cardiac cath.  Troponins elevation Suspected to Be from Demand Ischemia.  Cardiology not planning for additional work up  at this time due to her anemia.  Currently recommending to discontinue Plavix  permanently.  Echocardiogram showed EF of 55 to 60%, no wall motion abnormality  History of severe AS: Echocardiogram showed mild aortic valve regurgitation, severe aortic stenosis.  Cardiology following.  Given few doses of  IV Lasix  here.  Currently appears euvolemic.  She declines further workup for her aortic stenosis  Paroxysmal A-fib: Monitor on telemetry.   Went into A-fib with RVR on 10/3, started on amiodarone  drip,now on oral amiodarone   Started on long-acting metoprolol .  She remains in normal sinus rhythm this morning.  Cardiology arranged for today outpatient cardiac monitor.  After discussion with GI and cardiology, we restarted Eliquis  at 2.5 mg twice daily from tomorrow  Chronic HFpEF: Given few days of IV Lasix .  Appears near euvolemic status, given a dose of oral Lasix  this morning.  Hypertension: Currently blood pressure stable.  Monitor  History of endometrial carcinoma: Status post hysterectomy, radiation therapy, chemotherapy.  Follows with Dr. Timmy.  History of sigmoid colon obstruction, status post transverse loop colostomy.  Hypothyroidism: Continue Synthyroid  Hyperlipidemia: Takes Zetia  at home.  Debility/deconditioning: PT evaluation requested, recommended home health         DVT prophylaxis:SCDs Start: 03/01/24  0416     Code Status: Full Code  Family Communication: Discussed with  son  at bedside on 10/9  Patient status:Inpatient  Patient is from :Home  Anticipated discharge un:Ynfz with home health  Estimated DC date: Likely tomorrow.   Consultants: Cardiology,GI  Procedures:EGD/colonoscopy  Antimicrobials:  Anti-infectives (From admission, onward)    None       Subjective: Patient seen and examined at the bedside today.  Hemodynamically stable.  Overall looks comfortable.  Not in significant pain around her  colostomy site like yesterday.  She has been weaned to room air.  Heart rate in the range of 60s.  Son was requesting to decrease the dose of metoprolol .  We discussed about discharge planning, patient/her son feel that she is not ready to go home today  Objective: Vitals:   03/08/24 0745 03/08/24 0856 03/08/24 1133 03/08/24 1135  BP: (!) 142/73 134/64  (!) 143/48  Pulse: 66 64 74 60  Resp: 19 14  17   Temp: 98.1 F (36.7 C) 98.4 F (36.9 C)  97.6 F (36.4 C)  TempSrc: Oral Oral  Oral  SpO2: 99% 98% 97% 99%  Weight:      Height:        Intake/Output Summary (Last 24 hours) at 03/08/2024 1257 Last data filed at 03/08/2024 0900 Gross per 24 hour  Intake 240 ml  Output 1000 ml  Net -760 ml   Filed Weights   03/06/24 0440 03/07/24 0303 03/08/24 0548  Weight: 56.7 kg 56.6 kg 56.6 kg    Examination:   General exam: Overall comfortable, not in distress HEENT: PERRL Respiratory system:  no wheezes or crackles  Cardiovascular system: S1 & S2 heard, RRR.  Gastrointestinal system: Abdomen is nondistended, soft and nontender.  Colostomy site with empty bag Central nervous system: Alert and oriented Extremities: Chronic left lower extremity edema edema, no clubbing ,no cyanosis Skin: No rashes, no ulcers,no icterus         Data Reviewed: I have personally reviewed following labs and imaging studies  CBC: Recent Labs  Lab 03/02/24 0305 03/02/24 1155  03/02/24 1742 03/03/24 1032 03/04/24 0231 03/05/24 1106  WBC 12.1*  --   --  10.7* 8.8 7.4  HGB 9.8* 10.5* 10.7* 9.6* 9.2* 9.4*  HCT 30.9* 32.5* 34.2* 30.2* 29.0* 30.3*  MCV 87.0  --   --  87.5 87.9 88.1  PLT 321  --   --  317 285 309   Basic Metabolic Panel: Recent Labs  Lab 03/02/24 0305 03/03/24 1032 03/04/24 0231 03/05/24 1106  NA 140 139 137 136  K 3.9 3.2* 3.5 3.7  CL 102 104 101 102  CO2 25 27 26 27   GLUCOSE 127* 118* 117* 111*  BUN 14 16 24* 20  CREATININE 0.92 0.93 1.00 0.67  CALCIUM 8.2* 7.9* 7.8* 8.2*     No results found for this or any previous visit (from the past 240 hours).   Radiology Studies: DG CHEST PORT 1 VIEW Result Date: 03/07/2024 CLINICAL DATA:  Follow-up CHF.  Shortness of breath. EXAM: PORTABLE CHEST 1 VIEW COMPARISON:  03/02/2024.  Chest CTA dated 03/04/2023. FINDINGS: Stable enlarged cardiac silhouette and prominent pulmonary vasculature and interstitial markings with Kerley lines. Stable small bilateral pleural effusions and mild patchy right upper lobe opacity. Stable post CABG changes and right jugular catheter with its tip in the superior vena cava.  Diffuse osteopenia. IMPRESSION: 1. Stable congestive heart failure with small bilateral pleural effusions. 2. Stable mild patchy right upper lobe opacity. This could represent pneumonia or alveolar edema. Electronically Signed   By: Elspeth Bathe M.D.   On: 03/07/2024 12:37     Scheduled Meds:  sodium chloride    Intravenous Once   amiodarone   100 mg Oral Daily   ezetimibe   10 mg Oral Daily   levothyroxine   50 mcg Oral Daily   mesalamine  4 g Rectal Daily   [START ON 03/09/2024] metoprolol  succinate  100 mg Oral Daily   pantoprazole   40 mg Oral Daily   polyethylene glycol  17 g Oral Daily   sodium chloride  flush  3 mL Intravenous Q12H   triamcinolone  cream   Topical Daily   Continuous Infusions:     LOS: 7 days   Ivonne Mustache, MD [facility] P10/01/2024, 12:57 PM

## 2024-03-08 NOTE — Progress Notes (Signed)
 Mobility Specialist Progress Note:    03/08/24 1101  Mobility  Activity Dangled on edge of bed (Heel Raises, Leg Ext, Leg Lifts)  Level of Assistance Standby assist, set-up cues, supervision of patient - no hands on  Assistive Device None  Range of Motion/Exercises Left leg;Right leg  Activity Response Tolerated fair;RN notified  Mobility Referral Yes  Mobility visit 1 Mobility  Mobility Specialist Start Time (ACUTE ONLY) 1101  Mobility Specialist Stop Time (ACUTE ONLY) 1113  Mobility Specialist Time Calculation (min) (ACUTE ONLY) 12 min   Received pt sitting on EOB w/ son in room. Pt not agreeable to ambulating d/t just working w/ PT, but willing to do some things on EOB. Pt c/o feeling tired and not being able to breathe at times. Pt moving decently and able to do exercises. Left pt in room w/ all needs met. Notified RN.   Venetia Keel Mobility Specialist Please Neurosurgeon or Rehab Office at [phone]

## 2024-03-08 NOTE — Plan of Care (Signed)
  Problem: Education: Goal: Ability to identify signs and symptoms of gastrointestinal bleeding will improve Outcome: Progressing   Problem: Bowel/Gastric: Goal: Will show no signs and symptoms of gastrointestinal bleeding Outcome: Progressing   Problem: Fluid Volume: Goal: Will show no signs and symptoms of excessive bleeding Outcome: Progressing   Problem: Clinical Measurements: Goal: Complications related to the disease process, condition or treatment will be avoided or minimized Outcome: Progressing   Problem: Education: Goal: Knowledge of General Education information will improve Description: Including pain rating scale, medication(s)/side effects and non-pharmacologic comfort measures Outcome: Progressing   Problem: Health Behavior/Discharge Planning: Goal: Ability to manage health-related needs will improve Outcome: Progressing   Problem: Clinical Measurements: Goal: Ability to maintain clinical measurements within normal limits will improve Outcome: Progressing Goal: Will remain free from infection Outcome: Progressing Goal: Diagnostic test results will improve Outcome: Progressing Goal: Respiratory complications will improve Outcome: Progressing Goal: Cardiovascular complication will be avoided Outcome: Progressing   Problem: Activity: Goal: Risk for activity intolerance will decrease Outcome: Progressing   Problem: Nutrition: Goal: Adequate nutrition will be maintained Outcome: Progressing   Problem: Coping: Goal: Level of anxiety will decrease Outcome: Progressing   Problem: Elimination: Goal: Will not experience complications related to bowel motility Outcome: Progressing Goal: Will not experience complications related to urinary retention Outcome: Progressing   Problem: Pain Managment: Goal: General experience of comfort will improve and/or be controlled Outcome: Progressing

## 2024-03-08 NOTE — Progress Notes (Signed)
 The patient's nurse reached out because of concerns that a family member had.  The patient's son expressed concerns that her metoprolol  XL 125 mg daily is contributing to her heart rate being in the 60s and also expressed some concerns about worsening her fatigue.  Reassured the son that is typically not abnormal to have a heart rate in the 60s.  Our choices for rate limiting agents are limited given the patient's severe aortic stenosis.  Discussed with Dr. Delford and will decrease the dose to 100 mg daily.  Signed,  Morse Clause, PA-C 03/08/2024, 12:20 PM

## 2024-03-08 NOTE — Progress Notes (Signed)
 Subjective: No complaints.  Objective: Vital signs in last 24 hours: Temp:  [98 F (36.7 C)-98.4 F (36.9 C)] 98.4 F (36.9 C) (10/09 0856) Pulse Rate:  [64-74] 74 (10/09 1133) Resp:  [14-22] 14 (10/09 0856) BP: (125-149)/(54-75) 134/64 (10/09 0856) SpO2:  [96 %-99 %] 97 % (10/09 1133) Weight:  [56.6 kg] 56.6 kg (10/09 0548) Last BM Date : 03/08/24  Intake/Output from previous day: 10/08 0701 - 10/09 0700 In: 240 [P.O.:240] Out: 1000 [Urine:600; Stool:400] Intake/Output this shift: Total I/O In: 120 [P.O.:120] Out: -   General appearance: alert and no distress  Lab Results: No results for input(s): WBC, HGB, HCT, PLT in the last 72 hours. BMET No results for input(s): NA, K, CL, CO2, GLUCOSE, BUN, CREATININE, CALCIUM in the last 72 hours. LFT No results for input(s): PROT, ALBUMIN, AST, ALT, ALKPHOS, BILITOT, BILIDIR, IBILI in the last 72 hours. PT/INR No results for input(s): LABPROT, INR in the last 72 hours. Hepatitis Panel No results for input(s): HEPBSAG, HCVAB, HEPAIGM, HEPBIGM in the last 72 hours. C-Diff No results for input(s): CDIFFTOX in the last 72 hours. Fecal Lactopherrin No results for input(s): FECLLACTOFRN in the last 72 hours.  Studies/Results: DG CHEST PORT 1 VIEW Result Date: 03/07/2024 CLINICAL DATA:  Follow-up CHF.  Shortness of breath. EXAM: PORTABLE CHEST 1 VIEW COMPARISON:  03/02/2024.  Chest CTA dated 03/04/2023. FINDINGS: Stable enlarged cardiac silhouette and prominent pulmonary vasculature and interstitial markings with Kerley lines. Stable small bilateral pleural effusions and mild patchy right upper lobe opacity. Stable post CABG changes and right jugular catheter with its tip in the superior vena cava. Diffuse osteopenia. IMPRESSION: 1. Stable congestive heart failure with small bilateral pleural effusions. 2. Stable mild patchy right upper lobe opacity. This could represent pneumonia  or alveolar edema. Electronically Signed   By: Elspeth Bathe M.D.   On: 03/07/2024 12:37    Medications: Scheduled:  sodium chloride    Intravenous Once   amiodarone   100 mg Oral Daily   ezetimibe   10 mg Oral Daily   levothyroxine   50 mcg Oral Daily   mesalamine  4 g Rectal Daily   metoprolol  succinate  125 mg Oral Daily   pantoprazole   40 mg Oral Daily   polyethylene glycol  17 g Oral Daily   sodium chloride  flush  3 mL Intravenous Q12H   triamcinolone  cream   Topical Daily   Continuous:  Assessment/Plan: 1) Diversion colitis. 2) Anemia - stable.   Per the patient and her son, she did not have any further bleeding after the first application of the mesalamine enema.  The left sided colonic biopsies were surprisingly negative for any evidence of inflammation, but grossly there was bleeding.  She has issues with applying the enema.  At this point, because of the distress with applying the enema, the best compromise is to use the enema one a once or twice a week basis.  She can potentially use it PRN when she sees bleeding.  I answer all their questions and concerns.  At this time, it is safe to D/C home and follow up with Dr. Kristie.  LOS: 7 days   Crit Obremski D 03/08/2024, 12:00 PM

## 2024-03-09 DIAGNOSIS — D649 Anemia, unspecified: Secondary | ICD-10-CM | POA: Diagnosis not present

## 2024-03-09 DIAGNOSIS — I350 Nonrheumatic aortic (valve) stenosis: Secondary | ICD-10-CM | POA: Diagnosis not present

## 2024-03-09 LAB — BASIC METABOLIC PANEL WITH GFR
Anion gap: 10 (ref 5–15)
BUN: 15 mg/dL (ref 8–23)
CO2: 28 mmol/L (ref 22–32)
Calcium: 8.8 mg/dL — ABNORMAL LOW (ref 8.9–10.3)
Chloride: 100 mmol/L (ref 98–111)
Creatinine, Ser: 0.83 mg/dL (ref 0.44–1.00)
GFR, Estimated: >60 mL/min (ref >60)
Glucose, Bld: 106 mg/dL — ABNORMAL HIGH (ref 70–99)
Potassium: 4.3 mmol/L (ref 3.5–5.1)
Sodium: 138 mmol/L (ref 135–145)

## 2024-03-09 LAB — CBC WITH DIFFERENTIAL/PLATELET
Abs Immature Granulocytes: 0.03 K/uL (ref 0.00–0.07)
Basophils Absolute: 0.1 K/uL (ref 0.0–0.1)
Basophils Relative: 1 %
Eosinophils Absolute: 0.1 K/uL (ref 0.0–0.5)
Eosinophils Relative: 2 %
HCT: 31.4 % — ABNORMAL LOW (ref 36.0–46.0)
Hemoglobin: 9.8 g/dL — ABNORMAL LOW (ref 12.0–15.0)
Immature Granulocytes: 0 %
Lymphocytes Relative: 10 %
Lymphs Abs: 0.7 K/uL (ref 0.7–4.0)
MCH: 27.4 pg (ref 26.0–34.0)
MCHC: 31.2 g/dL (ref 30.0–36.0)
MCV: 87.7 fL (ref 80.0–100.0)
Monocytes Absolute: 0.5 K/uL (ref 0.1–1.0)
Monocytes Relative: 8 %
Neutro Abs: 5.3 K/uL (ref 1.7–7.7)
Neutrophils Relative %: 79 %
Platelets: 360 K/uL (ref 150–400)
RBC: 3.58 MIL/uL — ABNORMAL LOW (ref 3.87–5.11)
RDW: 16.2 % — ABNORMAL HIGH (ref 11.5–15.5)
WBC: 6.7 K/uL (ref 4.0–10.5)
nRBC: 0 % (ref 0.0–0.2)

## 2024-03-09 LAB — IRON AND TIBC
Iron: 28 ug/dL (ref 28–170)
Saturation Ratios: 10 % — ABNORMAL LOW (ref 10.4–31.8)
TIBC: 277 ug/dL (ref 250–450)
UIBC: 249 ug/dL

## 2024-03-09 LAB — FERRITIN: Ferritin: 101 ng/mL (ref 11–307)

## 2024-03-09 LAB — BRAIN NATRIURETIC PEPTIDE: B Natriuretic Peptide: 1891.1 pg/mL — ABNORMAL HIGH (ref 0.0–100.0)

## 2024-03-09 MED ORDER — SODIUM CHLORIDE 0.9 % IV SOLN
250.0000 mg | Freq: Once | INTRAVENOUS | Status: AC
  Administered 2024-03-09: 250 mg via INTRAVENOUS
  Filled 2024-03-09: qty 20

## 2024-03-09 MED ORDER — FOLIC ACID 1 MG PO TABS
2.0000 mg | ORAL_TABLET | Freq: Every day | ORAL | Status: DC
  Administered 2024-03-09 – 2024-03-14 (×6): 2 mg via ORAL
  Filled 2024-03-09 (×6): qty 2

## 2024-03-09 MED ORDER — DARBEPOETIN ALFA 300 MCG/0.6ML IJ SOSY
300.0000 ug | PREFILLED_SYRINGE | Freq: Once | INTRAMUSCULAR | Status: AC
  Administered 2024-03-09: 300 ug via SUBCUTANEOUS
  Filled 2024-03-09: qty 0.6

## 2024-03-09 MED ORDER — FUROSEMIDE 20 MG PO TABS
20.0000 mg | ORAL_TABLET | Freq: Every day | ORAL | Status: DC
  Administered 2024-03-09 – 2024-03-10 (×2): 20 mg via ORAL
  Filled 2024-03-09 (×2): qty 1

## 2024-03-09 NOTE — Progress Notes (Signed)
 PROGRESS NOTE  Shannon Scott  FMW:[id number] DOB: [birth year] DOA: 02/29/2024 PCP: Elliot Charm, MD   Brief Narrative: As per prior documentation: Patient is 83 years female with history of hyperlipidemia, endometrial carcinoma, coronary artery disease status post CABG, paroxysmal A-fib on Eliquis , chronic HFpEF, lymphedema, colostomy, chronic anemia who presented for the evaluation of low hemoglobin as noted on outpatient blood work.  Patient was also having chest pressure triggered by exertion.  She was found to have a hemoglobin of 6.7.  Her stool was also noted to be dark.  On presentation, she was hemodynamically stable.  Lab work showed hemoglobin was 6.8, positive FOBT, troponin of 104.  Cardiology, GI consulted.  Given 2 units of blood transfusion. S/P EGD/colonoscopy on 10/3.  Hospital course also remarkable for development of A-fib with RVR, started on amiodarone  drip.  Currently remains in normal sinus rhythm.  PT evaluation done, recommended home health.  Hospital course remarkable for persistent pain, bloating and colostomy site.  Patient does not feel ready to go home again .  GI and cardiology have cleared for discharge. She thinks that she may be able to go home tomorrow  03/09/2024: Patient seen alongside patient's son.  Patient is not keen on being discharged back home.  Patient tells me that she was ordered the medications per Dr. Timmy, she will want to stay in the hospital and know how her body will react to the medications.  Apparently, patient was given IV iron  250 mg and subcutaneous Aranesp  300 mcg.  Hopefully, patient will be discharged back home tomorrow.  Assessment & Plan:  Principal Problem:   Symptomatic anemia Active Problems:   Endometrial cancer (HCC)   Hypothyroidism   PAF (paroxysmal atrial fibrillation) (HCC)   Severe aortic stenosis   Coronary artery disease involving native coronary artery of native heart with angina pectoris   Elevated  troponin   Chronic heart failure with preserved ejection fraction (HFpEF) (HCC)   GI bleeding  Symptomatic anemia/GI bleed:  Presented with hemoglobin of 6.8 dark stool.  Given 2 units of blood transfusion.  Currently hemoglobin of 9. S/P  colonoscopy, EGD on 10/3 which showed 10 cm hiatal hernia, normal stomach/duodenum, erythematous mucosa in distal transverse colon, biopsy taken.  Started on mesalamine daily by GI.Eliquis /Plavix  on hold.  Stool on the colostomy bag appears less darker/ less red.[facility] nurse taught  on  putting mesalamine enema through her colostomy. But patient continued to have bloating/discomfort on the ostomy site.  Dr Rollin saw her today and recommended mesalamine enema once or twice a week 03/09/2024: Stable hemoglobin (9.8 g/dL).  Likely discharge tomorrow.  Coronary artery disease/troponin elevation:  -Cardiology consulted and following.  She was following with cardiology as an outpatient and was planned for cardiac cath.  Troponins elevation Suspected to Be from Demand Ischemia.  Cardiology not planning for additional work up  at this time due to her anemia.  Currently recommending to discontinue Plavix  permanently.  Echocardiogram showed EF of 55 to 60%, no wall motion abnormality 03/09/2024: Cardiology input is appreciated.  History of severe AS:  -Echocardiogram showed mild aortic valve regurgitation, severe aortic stenosis.  Cardiology following.  Given few doses of  IV Lasix  here.  Currently appears euvolemic.  She declines further workup for her aortic stenosis.   Paroxysmal A-fib:  -Monitor on telemetry.   Went into A-fib with RVR on 10/3, started on amiodarone  drip,now on oral amiodarone   Started on long-acting metoprolol .  She remains in normal sinus rhythm this  morning.  Cardiology arranged for today outpatient cardiac monitor.  After discussion with GI and cardiology, we restarted Eliquis  at 2.5 mg twice daily from tomorrow  Chronic HFpEF:  Given few days  of IV Lasix .  Appears near euvolemic status, given a dose of oral Lasix  this morning. 03/09/2024: Send stable.  Hypertension:  : Currently blood pressure stable.  Monitor  History of endometrial carcinoma: Status post hysterectomy, radiation therapy, chemotherapy.  Follows with Dr. Timmy.  History of sigmoid colon obstruction, status post transverse loop colostomy.  Hypothyroidism: Continue Synthyroid  Hyperlipidemia: Takes Zetia  at home.  Debility/deconditioning: PT evaluation requested, recommended home health         DVT prophylaxis:apixaban  (ELIQUIS ) tablet 2.5 mg Start: 03/09/24 1000 SCDs Start: 03/01/24 0416 apixaban  (ELIQUIS ) tablet 2.5 mg     Code Status: Full Code  Family Communication: Discussed with  son  at bedside on 10/9  Patient status:Inpatient  Patient is from :Home  Anticipated discharge un:Ynfz with home health  Estimated DC date: Likely tomorrow.   Consultants: Cardiology,GI  Procedures:EGD/colonoscopy  Antimicrobials:  Anti-infectives (From admission, onward)    None       Subjective: Patient seen alongside son. No acute been discharged back home. Abdominal pain around the colostomy has improved.  Objective: Vitals:   03/09/24 0440 03/09/24 0500 03/09/24 0804 03/09/24 1220  BP: 136/73  118/67 139/68  Pulse: 69  73 (!) 59  Resp: 19  (!) 25 (!) 21  Temp: 97.8 F (36.6 C)  98.3 F (36.8 C) 98.5 F (36.9 C)  TempSrc: Temporal  Oral Oral  SpO2: 91%  95% 94%  Weight:  55.6 kg    Height:        Intake/Output Summary (Last 24 hours) at 03/09/2024 1646 Last data filed at 03/09/2024 1300 Gross per 24 hour  Intake 240 ml  Output 1750 ml  Net -1510 ml   Filed Weights   03/07/24 0303 03/08/24 0548 03/09/24 0500  Weight: 56.6 kg 56.6 kg 55.6 kg    Examination:   General exam: Not in any distress.  Awake and alert.   HEENT: Patient is pale. Respiratory system: Clear to auscultation. Cardiovascular system: S1 & S2  heard Gastrointestinal system: Abdomen is soft and nontender.   Central nervous system: Patient is awake and alert.   Data Reviewed: I have personally reviewed following labs and imaging studies  CBC: Recent Labs  Lab 03/02/24 1742 03/03/24 1032 03/04/24 0231 03/05/24 1106 03/09/24 0652  WBC  --  10.7* 8.8 7.4 6.7  NEUTROABS  --   --   --   --  5.3  HGB 10.7* 9.6* 9.2* 9.4* 9.8*  HCT 34.2* 30.2* 29.0* 30.3* 31.4*  MCV  --  87.5 87.9 88.1 87.7  PLT  --  317 285 309 360   Basic Metabolic Panel: Recent Labs  Lab 03/03/24 1032 03/04/24 0231 03/05/24 1106 03/09/24 0258  NA 139 137 136 138  K 3.2* 3.5 3.7 4.3  CL 104 101 102 100  CO2 27 26 27 28   GLUCOSE 118* 117* 111* 106*  BUN 16 24* 20 15  CREATININE 0.93 1.00 0.67 0.83  CALCIUM 7.9* 7.8* 8.2* 8.8*     No results found for this or any previous visit (from the past 240 hours).   Radiology Studies: No results found.    Scheduled Meds:  sodium chloride    Intravenous Once   amiodarone   100 mg Oral Daily   apixaban   2.5 mg Oral BID   ezetimibe   10 mg Oral Daily   folic acid   2 mg Oral Daily   furosemide   20 mg Oral Daily   levothyroxine   50 mcg Oral Daily   mesalamine  4 g Rectal Daily   metoprolol  succinate  100 mg Oral Daily   pantoprazole   40 mg Oral Daily   polyethylene glycol  17 g Oral Daily   sodium chloride  flush  3 mL Intravenous Q12H   triamcinolone  cream   Topical Daily   Continuous Infusions:  Time spent: 35 minutes.   LOS: 8 days   Leatrice LILLETTE Chapel, MD [facility] P10/02/2024, 4:46 PM

## 2024-03-09 NOTE — Consult Note (Signed)
 [name] is well-known to me.  She is a very nice 83 year old white female.  We have been following her for several years.  She has a history of recurrent endometrial carcinoma.  She has been on immunotherapy.  She has had no treatment for probably 3 years now.  So far, there is no evidence of recurrent disease.  She does have history of iron  deficiency anemia.  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  She has been getting iron  in the office.  She does get Aranesp  on occasion in the office.  She apparently was admitted on 03/01/2024.  At that time, she was noted to be quite anemic.  She had noticed some dark stool in her ostomy.  She had a hemoglobin of 6.8.  She had a elevated troponin.  She subsequently was seen by Gastroenterology.  She had upper and lower endoscopy.  She had lot of erythema in the colon.  Pathology did not show any malignancy.  She was transfused.  She viruses that were done on 03/01/2024.  Her iron  saturation was only 11%.  I do not think she did receive any type of IV iron .  She did develop some transient atrial fibrillation.  She is now is on amiodarone .  She is on low-dose Eliquis .  She has not had labs for 4 days.  Her white cell count on 03/05/2024 was 7.4.  Hemoglobin 9.4.  Platelet count.  9000.  Her electrolytes today show sodium 138.  Potassium 4.3.  BUN 15 creatinine 0.83.  I do not think she has had any scans done.  She said that she might go home today.  She is still a bit weak.  She has a decreased appetite.  Her vital signs showed temperature of 97.8.  Pulse 69.  Blood pressure 136/73.  Her head and neck exam shows no ocular or oral lesions.  There is no scleral icterus.  Her conjunctiva is slightly pale.  Her lungs sound relatively clear bilaterally.  Cardiac exam shows a regular rate and rhythm.  She has a 4/6 systolic ejection murmur consistent with her aortic stenosis.  Her abdomen is soft.  She has a ostomy bag.  She has decent bowel  sounds.  There is no guarding or rebound tenderness.  Extremity shows some chronic edema in her legs.  From my perspective, I do not see anything that looks like malignancy.  I know that she was supposed to have a CT scan set up at some point.  If she is going to be discharged today, we will see about doing a CT scan as an outpatient.  I do think we have to give her a dose of IV iron .  I also would probably put her on folic acid ..  She has responded well to Aranesp  in the past.  I will see about giving her a dose of Aranesp .  It would be nice to try to pick up her hemoglobin little bit more.  We still do not have the results back from CBC today.  It is always nice and fun to talk with her.   Jeralyn Crease, MD  [place] 1:5

## 2024-03-09 NOTE — Progress Notes (Signed)
 Progress Note  Patient Name: Shannon Scott Date of Encounter: 03/09/2024 [facility] Cardiologist: Vinie JAYSON Maxcy, MD   Interval Summary   Hemoglobin stable/trending up today at 9.8 BNP 1,891, up from 653 last week  Patient reports feeling much better today Will discuss plans for diuresis with MD   Vital Signs Vitals:   03/08/24 2026 03/09/24 0440 03/09/24 0500 03/09/24 0804  BP: (!) 156/63 136/73  118/67  Pulse: 71 69  73  Resp: 20 19  (!) 25  Temp: 98.5 F (36.9 C) 97.8 F (36.6 C)  98.3 F (36.8 C)  TempSrc: Oral Temporal  Oral  SpO2: 96% 91%  95%  Weight:   55.6 kg   Height:        Intake/Output Summary (Last 24 hours) at 03/09/2024 0910 Last data filed at 03/09/2024 0500 Gross per 24 hour  Intake --  Output 1050 ml  Net -1050 ml      03/09/2024    5:00 AM 03/08/2024    5:48 AM 03/07/2024    3:03 AM  Last 3 Weights  Weight (lbs) 122 lb 9.2 oz 124 lb 12.8 oz 124 lb 12.5 oz  Weight (kg) 55.6 kg 56.609 kg 56.6 kg     Telemetry/ECG  Sinus rhythm, HR 70-80s - Personally Reviewed  Physical Exam  GEN: No acute distress.   Cardiac: RRR, SEM at RUSB.  Respiratory: decreased breath sounds bilaterally. GI: Soft, nontender, non-distended  MS: No edema  Assessment & Plan  Shannon Scott is a 83 y.o. female with a hx of CAD s/p CABG x 3 with LIMA-LAD, SVG-OM, SVG-RCA in 2005 and s/p DES to SVG-OM 2 in 03/2023, severe aortic stenosis, locally advanced endometrial cancer s/p chemoradiation, immunotherapy, hysterectomy with BSO, colonic mass with partial colectomy and colostomy, prior DVT, paroxysmal A-fib on eliquis , carotid artery stenosis, CVA, HTN, HLD, statin intolerance, lymphedema, asthma, hypothyroidism and who was seen for chest pain, and elevated troponins   Acute blood loss anemia  Diversion colitis  Presented with ABLA Required multiple blood transfusions since admission  Eliquis  and Plavix  have been on hold, Eliquis  restarted per GI No  apparent blood in stools this AM Hemoglobin stable this AM  CAD s/p CABG x 3 NSTEMI, suspect type II Hyperlipidemia 02/29/2024: ALT 11; LDL Direct 108  Elevated troponins consistent with demand ischemia in setting of ABLA  Restart home Repatha  at discharge Continue Zetia  10 mg daily   Severe AS  Chronic HFpEF Pulmonary HTN [facility] 02/2024: LVEF 55-60%, reduced RV function, moderately elevated PASP, moderately dilated LA, mild to moderate MR, mild to moderate TR, severe AS mean gradient of 30, VTI of 0.75, DVI of 0.28, and V-max of 3.71 Given PO Lasix  20 mg x 1 dose yesterday  Net - 5.5 L this admission, 850 cc out yesterday Renal function stable  BNP elevated at 1,891 (up from 653 last week) Patient reports feeling much better today  Avoid NTG given severe AS Poor TAVR candidate due to multiple comorbidities  Will discuss further diuresis with MD, patient would like to hold off on Lasix  and want to be careful with her severe AS  Paroxysmal A-Fib New SVT History of PAF New SVT noted on 10/3, converted with metoprolol  Currently in NSR, HR 70s  Restarted on Eliquis  2.5 mg BID, okay per GI Continue amiodarone  100 mg daily  Continue Toprol  100 mg daily Ordered 14 day monitor to apply discharge   Hypertension  BP low normal this AM 118/67 Continue  Toprol  100 mg daily    For questions or updates, please contact [facility] Please consult [email or web address] for contact info under         Signed, Waddell [name], PA-C

## 2024-03-09 NOTE — Plan of Care (Signed)
  Problem: Clinical Measurements: Goal: Complications related to the disease process, condition or treatment will be avoided or minimized Outcome: Progressing   Problem: Education: Goal: Knowledge of General Education information will improve Description: Including pain rating scale, medication(s)/side effects and non-pharmacologic comfort measures Outcome: Progressing   Problem: Health Behavior/Discharge Planning: Goal: Ability to manage health-related needs will improve Outcome: Progressing   Problem: Clinical Measurements: Goal: Ability to maintain clinical measurements within normal limits will improve Outcome: Progressing Goal: Will remain free from infection Outcome: Progressing Goal: Diagnostic test results will improve Outcome: Progressing Goal: Respiratory complications will improve Outcome: Progressing Goal: Cardiovascular complication will be avoided Outcome: Progressing   Problem: Activity: Goal: Risk for activity intolerance will decrease Outcome: Progressing   Problem: Nutrition: Goal: Adequate nutrition will be maintained Outcome: Progressing   Problem: Coping: Goal: Level of anxiety will decrease Outcome: Progressing   Problem: Elimination: Goal: Will not experience complications related to bowel motility Outcome: Progressing Goal: Will not experience complications related to urinary retention Outcome: Progressing   Problem: Pain Managment: Goal: General experience of comfort will improve and/or be controlled Outcome: Progressing   Problem: Safety: Goal: Ability to remain free from injury will improve Outcome: Progressing   Problem: Skin Integrity: Goal: Risk for impaired skin integrity will decrease Outcome: Progressing

## 2024-03-10 ENCOUNTER — Inpatient Hospital Stay (HOSPITAL_COMMUNITY)

## 2024-03-10 DIAGNOSIS — I10 Essential (primary) hypertension: Secondary | ICD-10-CM | POA: Diagnosis not present

## 2024-03-10 DIAGNOSIS — I350 Nonrheumatic aortic (valve) stenosis: Secondary | ICD-10-CM | POA: Diagnosis not present

## 2024-03-10 DIAGNOSIS — I272 Pulmonary hypertension, unspecified: Secondary | ICD-10-CM | POA: Diagnosis not present

## 2024-03-10 DIAGNOSIS — D649 Anemia, unspecified: Secondary | ICD-10-CM | POA: Diagnosis not present

## 2024-03-10 DIAGNOSIS — I5033 Acute on chronic diastolic (congestive) heart failure: Secondary | ICD-10-CM

## 2024-03-10 MED ORDER — TRAMADOL HCL 50 MG PO TABS
50.0000 mg | ORAL_TABLET | Freq: Two times a day (BID) | ORAL | Status: DC | PRN
  Administered 2024-03-10: 50 mg via ORAL
  Filled 2024-03-10 (×2): qty 1

## 2024-03-10 MED ORDER — FUROSEMIDE 10 MG/ML IJ SOLN
40.0000 mg | Freq: Two times a day (BID) | INTRAMUSCULAR | Status: AC
  Administered 2024-03-10 – 2024-03-11 (×2): 40 mg via INTRAVENOUS
  Filled 2024-03-10 (×2): qty 4

## 2024-03-10 NOTE — Progress Notes (Signed)
 Progress Note  Patient Name: Shannon Scott Date of Encounter: 03/10/2024  Primary Cardiologist:   Vinie JAYSON Maxcy, MD   Subjective   The patient reports that she has a bandlike discomfort around the lower chest upper epigastric area.  She feels like she has trouble getting a deep breath.  Her blood pressure was low.  Inpatient Medications    Scheduled Meds:  sodium chloride    Intravenous Once   amiodarone   100 mg Oral Daily   apixaban   2.5 mg Oral BID   ezetimibe   10 mg Oral Daily   folic acid   2 mg Oral Daily   furosemide   40 mg Intravenous Q12H   levothyroxine   50 mcg Oral Daily   mesalamine  4 g Rectal Daily   pantoprazole   40 mg Oral Daily   polyethylene glycol  17 g Oral Daily   sodium chloride  flush  3 mL Intravenous Q12H   triamcinolone  cream   Topical Daily   Continuous Infusions:  PRN Meds: acetaminophen  **OR** acetaminophen , ondansetron  **OR** ondansetron  (ZOFRAN ) IV   Vital Signs    Vitals:   03/10/24 0419 03/10/24 0500 03/10/24 0832 03/10/24 1634  BP: (!) 129/53  (!) 142/76 (!) 144/71  Pulse: (!) 58  65 68  Resp: (!) 22  (!) 23 19  Temp: (!) 100.4 F (38 C)  98.6 F (37 C) 98.8 F (37.1 C)  TempSrc:   Oral Oral  SpO2: 92%  95% 91%  Weight:  51.8 kg    Height:        Intake/Output Summary (Last 24 hours) at 03/10/2024 1651 Last data filed at 03/10/2024 1100 Gross per 24 hour  Intake 240 ml  Output 1250 ml  Net -1010 ml   Filed Weights   03/08/24 0548 03/09/24 0500 03/10/24 0500  Weight: 56.6 kg 55.6 kg 51.8 kg    Telemetry    NA - Personally Reviewed  ECG    NA - Personally Reviewed  Physical Exam   GEN: No acute distress.   Neck: No  JVD Cardiac: RRR, harsh 3 out of 6 systolic murmur late peaking radiating at the aortic outflow tract, no diastolic murmurs, rubs, or gallops.  Respiratory:     Decreased breath sounds with mild diffuse basilar crackles GI: Soft, nontender, non-distended  MS: No edema; No  deformity. Neuro:  Nonfocal  Psych: Normal affect   Labs    Chemistry Recent Labs  Lab 03/04/24 0231 03/05/24 1106 03/09/24 0258  NA 137 136 138  K 3.5 3.7 4.3  CL 101 102 100  CO2 26 27 28   GLUCOSE 117* 111* 106*  BUN 24* 20 15  CREATININE 1.00 0.67 0.83  CALCIUM 7.8* 8.2* 8.8*  GFRNONAA 56* >60 >60  ANIONGAP 10 7 10      Hematology Recent Labs  Lab 03/04/24 0231 03/05/24 1106 03/09/24 0652  WBC 8.8 7.4 6.7  RBC 3.30* 3.44* 3.58*  HGB 9.2* 9.4* 9.8*  HCT 29.0* 30.3* 31.4*  MCV 87.9 88.1 87.7  MCH 27.9 27.3 27.4  MCHC 31.7 31.0 31.2  RDW 17.0* 16.2* 16.2*  PLT 285 309 360    Cardiac EnzymesNo results for input(s): TROPONINI in the last 168 hours. No results for input(s): TROPIPOC in the last 168 hours.   BNP Recent Labs  Lab 03/09/24 0652  BNP 1,891.1*     DDimer No results for input(s): DDIMER in the last 168 hours.   Radiology    No results found.  Cardiac Studies  Echo:    1. Left ventricular ejection fraction, by estimation, is 55 to 60%. Left  ventricular ejection fraction by PLAX is 55 %. The left ventricle has  normal function. The left ventricle has no regional wall motion  abnormalities. Left ventricular diastolic  function could not be evaluated.   2. Right ventricular systolic function is moderately reduced. The right  ventricular size is normal. There is moderately elevated pulmonary artery  systolic pressure. The estimated right ventricular systolic pressure is  49.0 mmHg.   3. Left atrial size was moderately dilated.   4. The mitral valve is degenerative. Mild to moderate mitral valve  regurgitation. Moderate to severe mitral annular calcification.   5. Tricuspid valve regurgitation is mild to moderate.   6. The aortic valve is calcified. Aortic valve regurgitation is mild.  Severe aortic valve stenosis. Aortic valve area, by VTI measures 0.75 cm.  Aortic valve mean gradient measures 30.0 mmHg. Aortic valve Vmax  measures  3.71 m/s. Peak gradient 55.1 mmHg,   DI 0.28.   7. The inferior vena cava is normal in size with <50% respiratory  variability, suggesting right atrial pressure of 8 mmHg.   Patient Profile     83 y.o. female  with a hx of CAD s/p CABG x 3 with LIMA-LAD, SVG-OM, SVG-RCA in 2005 and s/p DES to SVG-OM 2 in 03/2023, severe aortic stenosis, locally advanced endometrial cancer s/p chemoradiation, immunotherapy, hysterectomy with BSO, colonic mass with partial colectomy and colostomy, prior DVT, paroxysmal A-fib on eliquis , carotid artery stenosis, CVA, HTN, HLD, statin intolerance, lymphedema, asthma, hypothyroidism and who was seen for chest pain, and elevated troponins     Assessment & Plan    Acute blood loss anemia :  Eliquis  restarted.  Hgb appears to be stable.  Continue current therapyl   CAD s/p CABG x 3:    Suspected demand ischemia.  Discontinued Plavix  with GI bleed.  No plan for invasive evaluation especially given acute GI bleed   Severe AS :  No TAVR per patient wishes and with co morbid conditions.    This could be looked at again as an outpatient but not certainly in the face of the acute illness.  She does have some frailty and significant comorbidities.  She can review this with Dr. Mona and chronic follow-up.  Acute on chronic HFpEF:   She does have some increased shortness of breath and for now we will get IV Lasix  but only give 2 doses.   She did have a jump in her BNP.    We wil reassess forl chronic diuretic therapy dosing tomorrow.    Pulmonary HTN:    Echo results as above.  Reduced RV function.  Medical management. Paroxysmal A-Fib:  Plan is for out patient 14 day monitor.  Currently on anticoagulation.  She has had some recent SVT which will be evaluated as well.  Continue current meds.  She did require decreased dose of beta-blocker this admission.  Currently held as below.  Continue oral amio.     Hypertension: For now we are holding her beta-blocker but she  will likely need to resume at a lower dose.  I will reassess again tomorrow.  For questions or updates, please contact [facility] Please consult [email or web address] for contact info under Cardiology/STEMI.   Signed, Lynwood Schilling, MD  03/10/2024, 4:51 PM

## 2024-03-10 NOTE — Plan of Care (Signed)
   Problem: Clinical Measurements: Goal: Complications related to the disease process, condition or treatment will be avoided or minimized Outcome: Progressing

## 2024-03-10 NOTE — Progress Notes (Signed)
 PROGRESS NOTE  Shannon Scott  FMW:[id number] DOB: [birth year] DOA: 02/29/2024 PCP: Elliot Charm, MD   Brief Narrative: As per prior documentation: Patient is 83 years female with history of hyperlipidemia, endometrial carcinoma, coronary artery disease status post CABG, paroxysmal A-fib on Eliquis , chronic HFpEF, lymphedema, colostomy, chronic anemia who presented for the evaluation of low hemoglobin as noted on outpatient blood work.  Patient was also having chest pressure triggered by exertion.  She was found to have a hemoglobin of 6.7.  Her stool was also noted to be dark.  On presentation, she was hemodynamically stable.  Lab work showed hemoglobin was 6.8, positive FOBT, troponin of 104.  Cardiology, GI consulted.  Given 2 units of blood transfusion. S/P EGD/colonoscopy on 10/3.  Hospital course also remarkable for development of A-fib with RVR, started on amiodarone  drip.  Currently remains in normal sinus rhythm.  PT evaluation done, recommended home health.  Hospital course remarkable for persistent pain, bloating and colostomy site.  Patient does not feel ready to go home again .  GI and cardiology have cleared for discharge. She thinks that she may be able to go home tomorrow  03/09/2024: Patient seen alongside patient's son.  Patient is not keen on being discharged back home.  Patient tells me that she was ordered the medications per Dr. Timmy, she will want to stay in the hospital and know how her body will react to the medications.  Apparently, patient was given IV iron  250 mg and subcutaneous Aranesp  300 mcg.  Hopefully, patient will be discharged back home tomorrow.  03/10/2024: Patient seen alongside patient's son, daughter and patient's significant.  Patient reports his lower extremity.  Patient has significant aortic stenosis.  Last chest x-ray revealed pulmonary edema.  Elevated cardiac BNP.  Will hold Toprol -XL.  Will change oral diuretics to IV Lasix .  Will obtain  another chest x-ray.  Communicated with cardiology team, Dr. Lynwood Schilling.  Dr. Schilling is currently enough to see the patient.  Will also consult the palliative care team.  Discussed above with patient's children.  All questions answered.    Assessment & Plan:  Principal Problem:   Symptomatic anemia Active Problems:   Endometrial cancer (HCC)   Hypothyroidism   PAF (paroxysmal atrial fibrillation) (HCC)   Severe aortic stenosis   Coronary artery disease involving native coronary artery of native heart with angina pectoris   Elevated troponin   Chronic heart failure with preserved ejection fraction (HFpEF) (HCC)   GI bleeding  Symptomatic anemia/GI bleed:  Presented with hemoglobin of 6.8 dark stool.  Given 2 units of blood transfusion.  Currently hemoglobin of 9. S/P  colonoscopy, EGD on 10/3 which showed 10 cm hiatal hernia, normal stomach/duodenum, erythematous mucosa in distal transverse colon, biopsy taken.  Started on mesalamine daily by GI.Eliquis /Plavix  on hold.  Stool on the colostomy bag appears less darker/ less red.GI lyn nurse taught  on  putting mesalamine enema through her colostomy. But patient continued to have bloating/discomfort on the ostomy site.  Dr Rollin saw her today and recommended mesalamine enema once or twice a week 03/09/2024: Stable hemoglobin (9.8 g/dL).  Likely discharge tomorrow. 03/10/2024: Hemoglobin of 9.8 g/dL today.  No bleed.  Coronary artery disease/troponin elevation:  -Cardiology consulted and following.  She was following with cardiology as an outpatient and was planned for cardiac cath.  Troponins elevation Suspected to Be from Demand Ischemia.  Cardiology not planning for additional work up  at this time due to her anemia.  Currently  recommending to discontinue Plavix  permanently.  Echocardiogram showed EF of 55 to 60%, no wall motion abnormality 03/09/2024: Cardiology input is appreciated. 03/10/2024: See above documentation.    History  of severe AS:  -Echocardiogram showed mild aortic valve regurgitation, severe aortic stenosis.  Cardiology following.  Given few doses of  IV Lasix  here.  Currently appears euvolemic.  She declines further workup for her aortic stenosis. 03/10/2024: Patient is reporting CHF symptoms in the setting of severe AAS.  Communicated with cardiology team.  Cardiology team will see patient.  Paroxysmal A-fib:  -Monitor on telemetry.   Went into A-fib with RVR on 10/3, started on amiodarone  drip,now on oral amiodarone   Started on long-acting metoprolol .  She remains in normal sinus rhythm this morning.  Cardiology arranged for today outpatient cardiac monitor.  After discussion with GI and cardiology, we restarted Eliquis  at 2.5 mg twice daily from tomorrow 03/10/2024: Heart rate is controlled.  Chronic HFpEF:  Given few days of IV Lasix .  Appears near euvolemic status, given a dose of oral Lasix  this morning. 03/10/2024: DC oral Lasix .  Start IV Lasix .  Toprol -XL is on hold.  Patient is reporting CHF symptoms.  Hypertension:  : Currently blood pressure stable.  Monitor  History of endometrial carcinoma: Status post hysterectomy, radiation therapy, chemotherapy.  Follows with Dr. Timmy.  History of sigmoid colon obstruction, status post transverse loop colostomy.  Hypothyroidism: Continue Synthyroid  Hyperlipidemia: Takes Zetia  at home.  Debility/deconditioning: PT evaluation requested, recommended home health         DVT prophylaxis:apixaban  (ELIQUIS ) tablet 2.5 mg Start: 03/09/24 1000 SCDs Start: 03/01/24 0416 apixaban  (ELIQUIS ) tablet 2.5 mg     Code Status: Full Code  Family Communication: Discussed with  son  at bedside on 10/9  Patient status:Inpatient  Patient is from :Home  Anticipated discharge un:Ynfz with home health  Estimated DC date: Likely tomorrow.   Consultants: Cardiology,GI  Procedures:EGD/colonoscopy  Antimicrobials:  Anti-infectives (From admission,  onward)    None       Subjective: Patient seen alongside son, daughter and nurse. Reports severe dyspnea on minimal exertion.  Objective: Vitals:   03/10/24 0419 03/10/24 0500 03/10/24 0832 03/10/24 1634  BP: (!) 129/53  (!) 142/76 (!) 144/71  Pulse: (!) 58  65 68  Resp: (!) 22  (!) 23 19  Temp: (!) 100.4 F (38 C)  98.6 F (37 C) 98.8 F (37.1 C)  TempSrc:   Oral Oral  SpO2: 92%  95% 91%  Weight:  51.8 kg    Height:        Intake/Output Summary (Last 24 hours) at 03/10/2024 1649 Last data filed at 03/10/2024 1100 Gross per 24 hour  Intake 240 ml  Output 1250 ml  Net -1010 ml   Filed Weights   03/08/24 0548 03/09/24 0500 03/10/24 0500  Weight: 56.6 kg 55.6 kg 51.8 kg    Examination:   General exam: Not in any distress.  Awake and alert.   HEENT: Patient is pale. Respiratory system: Clear to auscultation. Cardiovascular system: S1 & S2 heard Gastrointestinal system: Abdomen is soft and nontender.   Central nervous system: Patient is awake and alert.   Data Reviewed: I have personally reviewed following labs and imaging studies  CBC: Recent Labs  Lab 03/04/24 0231 03/05/24 1106 03/09/24 0652  WBC 8.8 7.4 6.7  NEUTROABS  --   --  5.3  HGB 9.2* 9.4* 9.8*  HCT 29.0* 30.3* 31.4*  MCV 87.9 88.1 87.7  PLT 285 309  360   Basic Metabolic Panel: Recent Labs  Lab 03/04/24 0231 03/05/24 1106 03/09/24 0258  NA 137 136 138  K 3.5 3.7 4.3  CL 101 102 100  CO2 26 27 28   GLUCOSE 117* 111* 106*  BUN 24* 20 15  CREATININE 1.00 0.67 0.83  CALCIUM 7.8* 8.2* 8.8*     No results found for this or any previous visit (from the past 240 hours).   Radiology Studies: No results found.    Scheduled Meds:  sodium chloride    Intravenous Once   amiodarone   100 mg Oral Daily   apixaban   2.5 mg Oral BID   ezetimibe   10 mg Oral Daily   folic acid   2 mg Oral Daily   furosemide   40 mg Intravenous Q12H   levothyroxine   50 mcg Oral Daily   mesalamine  4 g Rectal  Daily   pantoprazole   40 mg Oral Daily   polyethylene glycol  17 g Oral Daily   sodium chloride  flush  3 mL Intravenous Q12H   triamcinolone  cream   Topical Daily   Continuous Infusions:  Time spent: 55 minutes.   LOS: 9 days   Leatrice LILLETTE Chapel, MD [facility] P10/03/2024, 4:49 PM

## 2024-03-11 DIAGNOSIS — I5033 Acute on chronic diastolic (congestive) heart failure: Secondary | ICD-10-CM | POA: Diagnosis not present

## 2024-03-11 DIAGNOSIS — Z7189 Other specified counseling: Secondary | ICD-10-CM

## 2024-03-11 DIAGNOSIS — D649 Anemia, unspecified: Secondary | ICD-10-CM | POA: Diagnosis not present

## 2024-03-11 DIAGNOSIS — I350 Nonrheumatic aortic (valve) stenosis: Secondary | ICD-10-CM | POA: Diagnosis not present

## 2024-03-11 DIAGNOSIS — Z515 Encounter for palliative care: Secondary | ICD-10-CM

## 2024-03-11 DIAGNOSIS — I480 Paroxysmal atrial fibrillation: Secondary | ICD-10-CM | POA: Diagnosis not present

## 2024-03-11 LAB — CBC WITH DIFFERENTIAL/PLATELET
Abs Immature Granulocytes: 0.04 K/uL (ref 0.00–0.07)
Basophils Absolute: 0.1 K/uL (ref 0.0–0.1)
Basophils Relative: 1 %
Eosinophils Absolute: 0.2 K/uL (ref 0.0–0.5)
Eosinophils Relative: 3 %
HCT: 32.8 % — ABNORMAL LOW (ref 36.0–46.0)
Hemoglobin: 10.2 g/dL — ABNORMAL LOW (ref 12.0–15.0)
Immature Granulocytes: 1 %
Lymphocytes Relative: 12 %
Lymphs Abs: 0.8 K/uL (ref 0.7–4.0)
MCH: 27.1 pg (ref 26.0–34.0)
MCHC: 31.1 g/dL (ref 30.0–36.0)
MCV: 87 fL (ref 80.0–100.0)
Monocytes Absolute: 0.5 K/uL (ref 0.1–1.0)
Monocytes Relative: 8 %
Neutro Abs: 5.3 K/uL (ref 1.7–7.7)
Neutrophils Relative %: 75 %
Platelets: 443 K/uL — ABNORMAL HIGH (ref 150–400)
RBC: 3.77 MIL/uL — ABNORMAL LOW (ref 3.87–5.11)
RDW: 16.5 % — ABNORMAL HIGH (ref 11.5–15.5)
WBC: 7 K/uL (ref 4.0–10.5)
nRBC: 0 % (ref 0.0–0.2)

## 2024-03-11 LAB — RENAL FUNCTION PANEL
Albumin: 2.7 g/dL — ABNORMAL LOW (ref 3.5–5.0)
Anion gap: 11 (ref 5–15)
BUN: 20 mg/dL (ref 8–23)
CO2: 27 mmol/L (ref 22–32)
Calcium: 8.7 mg/dL — ABNORMAL LOW (ref 8.9–10.3)
Chloride: 97 mmol/L — ABNORMAL LOW (ref 98–111)
Creatinine, Ser: 0.86 mg/dL (ref 0.44–1.00)
GFR, Estimated: >60 mL/min (ref >60)
Glucose, Bld: 116 mg/dL — ABNORMAL HIGH (ref 70–99)
Phosphorus: 3.5 mg/dL (ref 2.5–4.6)
Potassium: 3.3 mmol/L — ABNORMAL LOW (ref 3.5–5.1)
Sodium: 135 mmol/L (ref 135–145)

## 2024-03-11 LAB — BASIC METABOLIC PANEL WITH GFR
Anion gap: 16 — ABNORMAL HIGH (ref 5–15)
BUN: 21 mg/dL (ref 8–23)
CO2: 28 mmol/L (ref 22–32)
Calcium: 8.7 mg/dL — ABNORMAL LOW (ref 8.9–10.3)
Chloride: 95 mmol/L — ABNORMAL LOW (ref 98–111)
Creatinine, Ser: 0.92 mg/dL (ref 0.44–1.00)
GFR, Estimated: >60 mL/min (ref >60)
Glucose, Bld: 106 mg/dL — ABNORMAL HIGH (ref 70–99)
Potassium: 3.3 mmol/L — ABNORMAL LOW (ref 3.5–5.1)
Sodium: 139 mmol/L (ref 135–145)

## 2024-03-11 LAB — BRAIN NATRIURETIC PEPTIDE: B Natriuretic Peptide: 1253.4 pg/mL — ABNORMAL HIGH (ref 0.0–100.0)

## 2024-03-11 LAB — MAGNESIUM: Magnesium: 1.6 mg/dL — ABNORMAL LOW (ref 1.7–2.4)

## 2024-03-11 MED ORDER — MAGNESIUM SULFATE 2 GM/50ML IV SOLN
2.0000 g | Freq: Once | INTRAVENOUS | Status: AC
  Administered 2024-03-11: 2 g via INTRAVENOUS
  Filled 2024-03-11: qty 50

## 2024-03-11 MED ORDER — POTASSIUM CHLORIDE 20 MEQ PO PACK
40.0000 meq | PACK | ORAL | Status: AC
  Administered 2024-03-11 (×2): 40 meq via ORAL
  Filled 2024-03-11 (×2): qty 2

## 2024-03-11 MED ORDER — POTASSIUM CHLORIDE 20 MEQ PO PACK
40.0000 meq | PACK | Freq: Once | ORAL | Status: AC
  Administered 2024-03-11: 40 meq via ORAL
  Filled 2024-03-11: qty 2

## 2024-03-11 MED ORDER — METOPROLOL SUCCINATE ER 50 MG PO TB24
50.0000 mg | ORAL_TABLET | Freq: Every day | ORAL | Status: DC
  Administered 2024-03-12 – 2024-03-14 (×3): 50 mg via ORAL
  Filled 2024-03-11 (×4): qty 1

## 2024-03-11 MED ORDER — TRAMADOL HCL 50 MG PO TABS
50.0000 mg | ORAL_TABLET | Freq: Two times a day (BID) | ORAL | Status: DC
Start: 2024-03-11 — End: 2024-03-14
  Administered 2024-03-11 – 2024-03-14 (×7): 50 mg via ORAL
  Filled 2024-03-11 (×6): qty 1

## 2024-03-11 MED ORDER — POTASSIUM CHLORIDE CRYS ER 20 MEQ PO TBCR
40.0000 meq | EXTENDED_RELEASE_TABLET | Freq: Once | ORAL | Status: DC
  Filled 2024-03-11: qty 2

## 2024-03-11 MED ORDER — FUROSEMIDE 40 MG PO TABS
40.0000 mg | ORAL_TABLET | Freq: Every day | ORAL | Status: DC
  Administered 2024-03-12 – 2024-03-13 (×2): 40 mg via ORAL
  Filled 2024-03-11 (×2): qty 1

## 2024-03-11 NOTE — Consult Note (Signed)
 Palliative Medicine Inpatient Consult Note  Consulting Provider:  Rosario Leatrice FERNS, MD   Reason for consult:   Palliative Care Consult Services Palliative Medicine Consult  Reason for Consult? Goal of care   03/11/2024  HPI:  Per intake H&P --> Patient is 83 years female with history of hyperlipidemia, endometrial carcinoma, coronary artery disease status post CABG, paroxysmal A-fib on Eliquis , chronic HFpEF, lymphedema, colostomy, chronic anemia. Identified to have an active GIB with associate troponin elevation as a result of demand ischemia. Palliative care has been asked to support additional goals of care conversations in the setting of severe aortic stenosis.   Clinical Assessment/Goals of Care:  *Please note that this is a verbal dictation therefore any spelling or grammatical errors are due to the Dragon Medical One system interpretation.  I have reviewed medical records including EPIC notes, labs and imaging, received report from bedside RN, assessed the patient who is sitting up in bed eating her scrambled eggs.    I met with Sarahanne and her son, Norleen to further discuss diagnosis prognosis, GOC, EOL wishes, disposition and options.   I introduced Palliative Medicine as specialized medical care for people living with serious illness. It focuses on providing relief from the symptoms and stress of a serious illness. The goal is to improve quality of life for both the patient and the family.  Medical History Review and Understanding:  A review of the Kanon's past medical history significant for endometrial carcinoma, coronary artery disease, atrial fibrillation, heart failure, lymphedema, anemia, and colonic mass with partial colectomy and colostomy was completed.  Social History:  Leighla is from [place] [facility] [facility] .  She shares that she moved to this area when she went to college.  She is a widow.  She has 1 son, 1 daughter, and 2 grandchildren.  Zyanna worked as a  Designer, jewellery for 40 years and shared that she had a preference towards medical surgical care.  She shares that she enjoys traveling to [place], beaches, learning about genealogy, playing games, and watching television.  Mariadelcarmen notes that she is very religious practicing within the [facility] denomination.  Functional and Nutritional State:  Preceding hospitalization Addalynne was living in a single-family home.  Her daughter and son are local and checking on her daily.  They help with paying bills and grocery shopping.  Yzabelle is able to utilize a cane and perform bathing, dressing, feeding herself.  Trinitie has had a stable appetite.  Advance Directives:  A detailed discussion was had today regarding advanced directives.  Sonja has never created advanced directives therefore I presented she and her son a copy of these.  We reviewed the importance of these documents moving forward to help with decision-making should Anshika be unable to make decisions for herself.  Anyelin would want both her son and her daughter to be her surrogate decision makers.  Code Status:  Concepts specific to code status, artifical feeding and hydration, continued IV antibiotics and rehospitalization was had.  The difference between a aggressive medical intervention path  and a palliative comfort care path for this patient at this time was had.   Encouraged patient/family to consider DNR/DNI status understanding evidenced based poor outcomes in similar hospitalized patient, as the cause of arrest is likely associated with advanced chronic/terminal illness rather than an easily reversible acute cardio-pulmonary event. I explained that DNR/DNI does not change the medical plan and it only comes into effect after a person has arrested (died).  It is a protective measure  to keep us  from harming the patient in their last moments of life.   Bonni shares that she understands what resuscitation is given that she is done at multiple  times throughout her career.  We reviewed the concern associated with Chery's advanced aortic stenosis and the likely poor outcomes of cardiopulmonary resuscitation.  She at this point would like to speak to her family about this and review the documents provided.  I also recommended she speak to her primary care provider who knows her for further conversation.  Provided [employer] Choices for [employer] booklet.   Discussion:  Endya and I discussed her reason for hospitalization inclusive of her anemia and gastrointestinal bleed.  We also discussed her chest pain and demand ischemia as a result of her anemia.  She shares at this time she does feel like she is improved.  A review of aortic stenosis was held and the different severities including mild, moderate, severe, and critical.  We reviewed that Dominigue is identified as having severe aortic stenosis.  We reviewed the potential symptoms one can experience as a result of that.  We discussed and lasts a procedure such as a TAVR is pursued symptoms will persist in the condition is likely something that would worsen.  This is something that Sheli is aware of and has already spoken to her cardiology team about.  We discussed potential outcomes in the short and long-term.  Chyler shares with me that her goals at this time include being able to go home and gain muscular strength.  Camrin  values her autonomy and wants to remain functional as long as possible  Discussed the importance of continued conversation with family and their  medical providers regarding overall plan of care and treatment options, ensuring decisions are within the context of the patients values and GOCs.  Provided Hard Choices for [employer] booklet.   Decision Maker: Charlise Giovanetti (son): [phone]  SUMMARY OF RECOMMENDATIONS   Full code/full scope of care --> provided insight and education on the potential outcomes associated with cardiopulmonary resuscitation in the  setting of underlying disease burden  Provided a MOST form for review and completion  Provided advance directives for review and completion  Discussed patient's aortic stenosis and potential outcomes associated with this  Ongoing palliative care support as needed  Code Status/Advance Care Planning: FULL CODE  Palliative Prophylaxis:  Aspiration, Bowel Regimen, Delirium Protocol, Frequent Pain Assessment, Oral Care, Palliative Wound Care, and Turn Reposition  Additional Recommendations (Limitations, Scope, Preferences): Continue present care  Psycho-social/Spiritual:  Desire for further Chaplaincy support: Yes Additional Recommendations: Education on disease burden namely aortic stenosis   Prognosis: One admission in the last 6 months - prolonged (ten days), high chronic disease comorbidities, albumin 2.9 -43-month mortality risk is elevated  Discharge Planning: Discharge home once medically optimized  Vitals:   03/11/24 0401 03/11/24 0525  BP: (!) 141/62 (!) 149/58  Pulse: 63 61  Resp: 18 17  Temp: 98.2 F (36.8 C)   SpO2: 93%     Intake/Output Summary (Last 24 hours) at 03/11/2024 0725 Last data filed at 03/11/2024 0425 Gross per 24 hour  Intake 120 ml  Output 3600 ml  Net -3480 ml   Last Weight  Most recent update: 03/11/2024  4:21 AM    Weight  52.4 kg (115 lb 8.3 oz)            LABS: CBC:    Component Value Date/Time   WBC 6.7 03/09/2024 0652   HGB  9.8 (L) 03/09/2024 0652   HGB 6.7 (LL) 02/29/2024 1606   HCT 31.4 (L) 03/09/2024 0652   HCT 22.8 (L) 02/29/2024 1606   PLT 360 03/09/2024 0652   PLT 327 02/29/2024 1606   MCV 87.7 03/09/2024 0652   MCV 92 02/29/2024 1606   NEUTROABS 5.3 03/09/2024 0652   NEUTROABS 7 09/08/2017 0000   LYMPHSABS 0.7 03/09/2024 0652   MONOABS 0.5 03/09/2024 0652   EOSABS 0.1 03/09/2024 0652   BASOSABS 0.1 03/09/2024 0652   Comprehensive Metabolic Panel:    Component Value Date/Time   NA 138 03/09/2024 0258   NA  141 02/29/2024 1606   K 4.3 03/09/2024 0258   CL 100 03/09/2024 0258   CO2 28 03/09/2024 0258   BUN 15 03/09/2024 0258   BUN 17 02/29/2024 1606   CREATININE 0.83 03/09/2024 0258   CREATININE 0.73 01/26/2024 1525   CREATININE 0.69 08/02/2016 1519   GLUCOSE 106 (H) 03/09/2024 0258   CALCIUM 8.8 (L) 03/09/2024 0258   AST 16 02/29/2024 2127   AST 16 01/26/2024 1525   ALT 11 02/29/2024 2127   ALT 7 01/26/2024 1525   ALKPHOS 52 02/29/2024 2127   BILITOT 0.3 02/29/2024 2127   BILITOT 0.2 01/26/2024 1525   PROT 5.4 (L) 02/29/2024 2127   ALBUMIN 2.9 (L) 02/29/2024 2127    Gen: Elderly Caucasian female chronically ill in appearance HEENT: Dry mucous membranes CV: Regular rate and rhythm PULM: On room air breathing is even and nonlabored ABD: Not tender, colostomy in place EXT: No edema  Neuro: Alert and oriented x3   PPS: 40-50%   This conversation/these recommendations were discussed with patient primary care team, Dr. Rosario ______________________________________________________ Rosaline Becton [facility] Palliative Medicine Team Team Cell Phone: [phone] Please utilize secure chat with additional questions, if there is no response within 30 minutes please call the above phone number  Total Time: 75 Billing based on MDM: High  Palliative Medicine Team providers are available by phone from 7am to 7pm daily and can be reached through the team cell phone.  Should this patient require assistance outside of these hours, please call the patient's attending physician.

## 2024-03-11 NOTE — Progress Notes (Signed)
 Progress Note  Patient Name: Shannon Scott Date of Encounter: 03/11/2024  Primary Cardiologist:   Vinie JAYSON Maxcy, MD   Subjective   She is breathing much better today.  Denies pain or cough.  Inpatient Medications    Scheduled Meds:  sodium chloride    Intravenous Once   amiodarone   100 mg Oral Daily   apixaban   2.5 mg Oral BID   ezetimibe   10 mg Oral Daily   folic acid   2 mg Oral Daily   levothyroxine   50 mcg Oral Daily   mesalamine  4 g Rectal Daily   pantoprazole   40 mg Oral Daily   polyethylene glycol  17 g Oral Daily   sodium chloride  flush  3 mL Intravenous Q12H   traMADol   50 mg Oral Q12H   triamcinolone  cream   Topical Daily   Continuous Infusions:  PRN Meds: acetaminophen  **OR** acetaminophen , ondansetron  **OR** ondansetron  (ZOFRAN ) IV   Vital Signs    Vitals:   03/10/24 2306 03/11/24 0401 03/11/24 0421 03/11/24 0525  BP: (!) 155/47 (!) 141/62  (!) 149/58  Pulse: 66 63  61  Resp: 20 18  17   Temp: 98.4 F (36.9 C) 98.2 F (36.8 C)    TempSrc: Oral Oral    SpO2: 96% 93%    Weight:   52.4 kg   Height:        Intake/Output Summary (Last 24 hours) at 03/11/2024 0950 Last data filed at 03/11/2024 0900 Gross per 24 hour  Intake 120 ml  Output 3600 ml  Net -3480 ml   Filed Weights   03/09/24 0500 03/10/24 0500 03/11/24 0421  Weight: 55.6 kg 51.8 kg 52.4 kg    Telemetry    NA - Personally Reviewed  ECG    NA - Personally Reviewed  Physical Exam   GEN: No  acute distress.   Neck: No  JVD Cardiac: RRR, 3/6 apical systolic, no diastolic murmurs, rubs, or gallops.  Respiratory:    Few basilar crackles GI: Soft, nontender, non-distended, normal bowel sounds  MS:  Mild leg edema; No deformity. Neuro:   Nonfocal  Psych: Oriented and appropriate   Labs    Chemistry Recent Labs  Lab 03/05/24 1106 03/09/24 0258  NA 136 138  K 3.7 4.3  CL 102 100  CO2 27 28  GLUCOSE 111* 106*  BUN 20 15  CREATININE 0.67 0.83  CALCIUM 8.2*  8.8*  GFRNONAA >60 >60  ANIONGAP 7 10     Hematology Recent Labs  Lab 03/05/24 1106 03/09/24 0652  WBC 7.4 6.7  RBC 3.44* 3.58*  HGB 9.4* 9.8*  HCT 30.3* 31.4*  MCV 88.1 87.7  MCH 27.3 27.4  MCHC 31.0 31.2  RDW 16.2* 16.2*  PLT 309 360    Cardiac EnzymesNo results for input(s): TROPONINI in the last 168 hours. No results for input(s): TROPIPOC in the last 168 hours.   BNP Recent Labs  Lab 03/09/24 0652  BNP 1,891.1*     DDimer No results for input(s): DDIMER in the last 168 hours.   Radiology    DG CHEST PORT 1 VIEW Result Date: 03/10/2024 CLINICAL DATA:  CHF EXAM: PORTABLE CHEST 1 VIEW COMPARISON:  03/07/2024 FINDINGS: Cardiac shadow is stable. Postsurgical changes are noted. Right chest wall port is seen in satisfactory position. Central vascular congestion is noted with slight increase in interstitial edema. Small effusions are noted bilaterally. IMPRESSION: Mild CHF with interstitial edema and small effusions. Electronically Signed   By: Oneil  Lukens M.D.   On: 03/10/2024 21:52    Cardiac Studies    Echo:    1. Left ventricular ejection fraction, by estimation, is 55 to 60%. Left  ventricular ejection fraction by PLAX is 55 %. The left ventricle has  normal function. The left ventricle has no regional wall motion  abnormalities. Left ventricular diastolic  function could not be evaluated.   2. Right ventricular systolic function is moderately reduced. The right  ventricular size is normal. There is moderately elevated pulmonary artery  systolic pressure. The estimated right ventricular systolic pressure is  49.0 mmHg.   3. Left atrial size was moderately dilated.   4. The mitral valve is degenerative. Mild to moderate mitral valve  regurgitation. Moderate to severe mitral annular calcification.   5. Tricuspid valve regurgitation is mild to moderate.   6. The aortic valve is calcified. Aortic valve regurgitation is mild.  Severe aortic valve  stenosis. Aortic valve area, by VTI measures 0.75 cm.  Aortic valve mean gradient measures 30.0 mmHg. Aortic valve Vmax measures  3.71 m/s. Peak gradient 55.1 mmHg,   DI 0.28.   7. The inferior vena cava is normal in size with <50% respiratory  variability, suggesting right atrial pressure of 8 mmHg.   Patient Profile     83 y.o. female  with a hx of CAD s/p CABG x 3 with LIMA-LAD, SVG-OM, SVG-RCA in 2005 and s/p DES to SVG-OM 2 in 03/2023, severe aortic stenosis, locally advanced endometrial cancer s/p chemoradiation, immunotherapy, hysterectomy with BSO, colonic mass with partial colectomy and colostomy, prior DVT, paroxysmal A-fib on eliquis , carotid artery stenosis, CVA, HTN, HLD, statin intolerance, lymphedema, asthma, hypothyroidism and who was seen for chest pain, and elevated troponins     Assessment & Plan    Acute blood loss anemia :  Eliquis  restarted.  Hgb appears to be stable on labs last check 10/10.  Continue current therapy   CAD s/p CABG x 3:    Suspected demand ischemia.  Medical management.      Severe AS :  No TAVR per patient wishes and with co morbid conditions.    This could be looked at again as an outpatient but not certainly in the face of the acute illness.     Acute on chronic HFpEF:   She had increased dyspnea yesterday and received IV Lasix .  She had 3500cc out.   She feels much better after 2 doses of IV Lasix .  I am going to go ahead and start 40 mg p.o. starting tomorrow.    Pulmonary HTN:    Echo results as above.  Reduced RV function.  Medical management.  Paroxysmal A-Fib: She is currently wearing a 14-day monitor.  Continue low-dose oral amiodarone .    Hypertension: For now we are holding her beta-blocker but she will likely need to resume at a lower dose.  I will reassess again tomorrow.  For questions or updates, please contact [facility] Please consult [email or web address] for contact info under Cardiology/STEMI.   Signed, Lynwood Schilling, MD   03/11/2024, 9:50 AM

## 2024-03-11 NOTE — Progress Notes (Signed)
 PROGRESS NOTE  Shannon Scott  FMW:[id number] DOB: March 08, 1941 DOA: 02/29/2024 PCP: Elliot Charm, MD   Brief Narrative: As per prior documentation: Patient is 83 years female with history of hyperlipidemia, endometrial carcinoma, coronary artery disease status post CABG, paroxysmal A-fib on Eliquis , chronic HFpEF, lymphedema, colostomy, chronic anemia who presented for the evaluation of low hemoglobin as noted on outpatient blood work.  Patient was also having chest pressure triggered by exertion.  She was found to have a hemoglobin of 6.7.  Her stool was also noted to be dark.  On presentation, she was hemodynamically stable.  Lab work showed hemoglobin was 6.8, positive FOBT, troponin of 104.  Cardiology, GI consulted.  Given 2 units of blood transfusion. S/P EGD/colonoscopy on 10/3.  Hospital course also remarkable for development of A-fib with RVR, started on amiodarone  drip.  Currently remains in normal sinus rhythm.  PT evaluation done, recommended home health.  Hospital course remarkable for persistent pain, bloating and colostomy site.  Patient does not feel ready to go home again .  GI and cardiology have cleared for discharge. She thinks that she may be able to go home tomorrow  03/09/2024: Patient seen alongside patient's son.  Patient is not keen on being discharged back home.  Patient tells me that she was ordered the medications per Dr. Timmy, she will want to stay in the hospital and know how her body will react to the medications.  Apparently, patient was given IV iron  250 mg and subcutaneous Aranesp  300 mcg.  Hopefully, patient will be discharged back home tomorrow.  03/10/2024: Patient seen alongside patient's son, daughter and patient's significant.  Patient reports his lower extremity.  Patient has significant aortic stenosis.  Last chest x-ray revealed pulmonary edema.  Elevated cardiac BNP.  Will hold Toprol -XL.  Will change oral diuretics to IV Lasix .  Will obtain  another chest x-ray.  Communicated with cardiology team, Dr. Lynwood Schilling.  Dr. Schilling is currently enough to see the patient.  Will also consult the palliative care team.  Discussed above with patient's children.  All questions answered.  03/11/2024: Patient seen alongside patient's son and daughter.  Patient feels much better today.  Cardiology and palliative care input is appreciated.  IV Lasix  is held after second dose.  For oral Lasix  tomorrow (40 mg p.o. once daily).    Assessment & Plan:  Principal Problem:   Symptomatic anemia Active Problems:   Endometrial cancer (HCC)   Hypothyroidism   PAF (paroxysmal atrial fibrillation) (HCC)   Severe aortic stenosis   Coronary artery disease involving native coronary artery of native heart with angina pectoris   Elevated troponin   Chronic heart failure with preserved ejection fraction (HFpEF) (HCC)   GI bleeding  Symptomatic anemia/GI bleed:  Presented with hemoglobin of 6.8 dark stool.  Given 2 units of blood transfusion.  Currently hemoglobin of 9. S/P  colonoscopy, EGD on 10/3 which showed 10 cm hiatal hernia, normal stomach/duodenum, erythematous mucosa in distal transverse colon, biopsy taken.  Started on mesalamine daily by GI.Eliquis /Plavix  on hold.  Stool on the colostomy bag appears less darker/ less red.GI lyn nurse taught  on  putting mesalamine enema through her colostomy. But patient continued to have bloating/discomfort on the ostomy site.  Dr Rollin saw her today and recommended mesalamine enema once or twice a week 03/09/2024: Stable hemoglobin (9.8 g/dL).  Likely discharge tomorrow. 03/10/2024: Hemoglobin of 9.8 g/dL today.  No bleed. 03/11/2024: Hemoglobin is stable (10.2 g/dL)  Coronary artery disease/troponin elevation:  -  Cardiology consulted and following.  She was following with cardiology as an outpatient and was planned for cardiac cath.  Troponins elevation Suspected to Be from Demand Ischemia.  Cardiology not  planning for additional work up  at this time due to her anemia.  Currently recommending to discontinue Plavix  permanently.  Echocardiogram showed EF of 55 to 60%, no wall motion abnormality 03/09/2024: Cardiology input is appreciated. 03/10/2024: See above documentation.    History of severe AS:  -Echocardiogram showed mild aortic valve regurgitation, severe aortic stenosis.  Cardiology following.  Given few doses of  IV Lasix  here.  Currently appears euvolemic.  She declines further workup for her aortic stenosis. 03/10/2024: Patient is reporting CHF symptoms in the setting of severe AAS.  Communicated with cardiology team.  Cardiology team will see patient. 03/11/2024: Will defer to the cardiology team.  Paroxysmal A-fib:  -Monitor on telemetry.   Went into A-fib with RVR on 10/3, started on amiodarone  drip,now on oral amiodarone   Started on long-acting metoprolol .  She remains in normal sinus rhythm this morning.  Cardiology arranged for today outpatient cardiac monitor.  After discussion with GI and cardiology, we restarted Eliquis  at 2.5 mg twice daily from tomorrow 03/11/2024: Heart rate is controlled.  Chronic HFpEF:  Given few days of IV Lasix .  Appears near euvolemic status, given a dose of oral Lasix  this morning. 03/10/2024: DC oral Lasix .  Start IV Lasix .  Toprol -XL is on hold.  Patient is reporting CHF symptoms.  Hypertension:  : Currently blood pressure stable.  Monitor  History of endometrial carcinoma: Status post hysterectomy, radiation therapy, chemotherapy.  Follows with Dr. Timmy.  History of sigmoid colon obstruction, status post transverse loop colostomy.  Hypothyroidism: Continue Synthyroid  Hyperlipidemia: Takes Zetia  at home.  Debility/deconditioning: PT evaluation requested, recommended home health  Hypokalemia: - Potassium of 3.3. - KCl 40 mEq Q4 hourly x 2 doses.  Hypomagnesemia: - Magnesium  of 1.6. - IV magnesium .  Repeat renal panel and magnesium   in the morning.       DVT prophylaxis:apixaban  (ELIQUIS ) tablet 2.5 mg Start: 03/09/24 1000 SCDs Start: 03/01/24 0416 apixaban  (ELIQUIS ) tablet 2.5 mg     Code Status: Full Code  Family Communication: Discussed with  son  at bedside on 10/9  Patient status:Inpatient  Patient is from :Home  Anticipated discharge un:Ynfz with home health  Estimated DC date: Likely tomorrow.   Consultants: Cardiology,GI  Procedures:EGD/colonoscopy  Antimicrobials:  Anti-infectives (From admission, onward)    None       Subjective: Patient seen alongside son, daughter and nurse. Dyspnea on exertion has resolved.  Objective: Vitals:   03/11/24 0421 03/11/24 0525 03/11/24 0949 03/11/24 1125  BP:  (!) 149/58 (!) 146/57 139/62  Pulse:  61 61 (!) 58  Resp:  17 (!) 21   Temp:   98.6 F (37 C)   TempSrc:   Oral   SpO2:   92%   Weight: 52.4 kg     Height:        Intake/Output Summary (Last 24 hours) at 03/11/2024 1511 Last data filed at 03/11/2024 0900 Gross per 24 hour  Intake 120 ml  Output 2900 ml  Net -2780 ml   Filed Weights   03/09/24 0500 03/10/24 0500 03/11/24 0421  Weight: 55.6 kg 51.8 kg 52.4 kg    Examination:   General exam: Not in any distress.  Awake and alert.   HEENT: Patient is pale. Respiratory system: Clear to auscultation. Cardiovascular system: S1 & S2 heard Gastrointestinal  system: Abdomen is soft and nontender.   Central nervous system: Patient is awake and alert.   Data Reviewed: I have personally reviewed following labs and imaging studies  CBC: Recent Labs  Lab 03/05/24 1106 03/09/24 0652 03/11/24 1208  WBC 7.4 6.7 7.0  NEUTROABS  --  5.3 5.3  HGB 9.4* 9.8* 10.2*  HCT 30.3* 31.4* 32.8*  MCV 88.1 87.7 87.0  PLT 309 360 443*   Basic Metabolic Panel: Recent Labs  Lab 03/05/24 1106 03/09/24 0258 03/11/24 1050 03/11/24 1208  NA 136 138 139 135  K 3.7 4.3 3.3* 3.3*  CL 102 100 95* 97*  CO2 27 28 28 27   GLUCOSE 111* 106* 106*  116*  BUN 20 15 21 20   CREATININE 0.67 0.83 0.92 0.86  CALCIUM 8.2* 8.8* 8.7* 8.7*  MG  --   --   --  1.6*  PHOS  --   --   --  3.5     No results found for this or any previous visit (from the past 240 hours).   Radiology Studies: DG CHEST PORT 1 VIEW Result Date: 03/10/2024 CLINICAL DATA:  CHF EXAM: PORTABLE CHEST 1 VIEW COMPARISON:  03/07/2024 FINDINGS: Cardiac shadow is stable. Postsurgical changes are noted. Right chest wall port is seen in satisfactory position. Central vascular congestion is noted with slight increase in interstitial edema. Small effusions are noted bilaterally. IMPRESSION: Mild CHF with interstitial edema and small effusions. Electronically Signed   By: Oneil Devonshire M.D.   On: 03/10/2024 21:52      Scheduled Meds:  sodium chloride    Intravenous Once   amiodarone   100 mg Oral Daily   apixaban   2.5 mg Oral BID   ezetimibe   10 mg Oral Daily   folic acid   2 mg Oral Daily   [START ON 03/12/2024] furosemide   40 mg Oral Daily   levothyroxine   50 mcg Oral Daily   mesalamine  4 g Rectal Daily   metoprolol  succinate  50 mg Oral Daily   pantoprazole   40 mg Oral Daily   polyethylene glycol  17 g Oral Daily   sodium chloride  flush  3 mL Intravenous Q12H   traMADol   50 mg Oral Q12H   triamcinolone  cream   Topical Daily   Continuous Infusions:  Time spent: 55 minutes.   LOS: 10 days   Shannon LILLETTE Chapel, MD [facility] P10/04/2024, 3:11 PM

## 2024-03-12 DIAGNOSIS — D649 Anemia, unspecified: Secondary | ICD-10-CM | POA: Diagnosis not present

## 2024-03-12 DIAGNOSIS — I5033 Acute on chronic diastolic (congestive) heart failure: Secondary | ICD-10-CM | POA: Diagnosis not present

## 2024-03-12 DIAGNOSIS — I350 Nonrheumatic aortic (valve) stenosis: Secondary | ICD-10-CM | POA: Diagnosis not present

## 2024-03-12 DIAGNOSIS — R7989 Other specified abnormal findings of blood chemistry: Secondary | ICD-10-CM | POA: Diagnosis not present

## 2024-03-12 LAB — RENAL FUNCTION PANEL
Albumin: 2.6 g/dL — ABNORMAL LOW (ref 3.5–5.0)
Anion gap: 8 (ref 5–15)
BUN: 15 mg/dL (ref 8–23)
CO2: 27 mmol/L (ref 22–32)
Calcium: 8.7 mg/dL — ABNORMAL LOW (ref 8.9–10.3)
Chloride: 101 mmol/L (ref 98–111)
Creatinine, Ser: 0.82 mg/dL (ref 0.44–1.00)
GFR, Estimated: >60 mL/min (ref >60)
Glucose, Bld: 110 mg/dL — ABNORMAL HIGH (ref 70–99)
Phosphorus: 3 mg/dL (ref 2.5–4.6)
Potassium: 4 mmol/L (ref 3.5–5.1)
Sodium: 136 mmol/L (ref 135–145)

## 2024-03-12 LAB — CBC
HCT: 33.4 % — ABNORMAL LOW (ref 36.0–46.0)
Hemoglobin: 10.4 g/dL — ABNORMAL LOW (ref 12.0–15.0)
MCH: 27.6 pg (ref 26.0–34.0)
MCHC: 31.1 g/dL (ref 30.0–36.0)
MCV: 88.6 fL (ref 80.0–100.0)
Platelets: 393 K/uL (ref 150–400)
RBC: 3.77 MIL/uL — ABNORMAL LOW (ref 3.87–5.11)
RDW: 16.7 % — ABNORMAL HIGH (ref 11.5–15.5)
WBC: 6.1 K/uL (ref 4.0–10.5)
nRBC: 0 % (ref 0.0–0.2)

## 2024-03-12 LAB — MAGNESIUM: Magnesium: 2.3 mg/dL (ref 1.7–2.4)

## 2024-03-12 NOTE — Progress Notes (Signed)
 PROGRESS NOTE  Shannon Scott  FMW:[id number] DOB: 09/24/1940 DOA: 02/29/2024 PCP: Elliot Charm, MD   Brief Narrative: Patient is an 83 year old female with past medical history significant for hyperlipidemia, endometrial carcinoma, coronary artery disease status post CABG, paroxysmal A-fib on Eliquis , chronic HFpEF, lymphedema, colostomy and chronic anemia.  Patient presented for the evaluation of symptomatic anemia.  Patient had chest pressure triggered by exertion.  Patient was found to have a hemoglobin of 6.7 grams per deciliter.  Patient's stool was noted to be dark.  Workup revealed positive FOBT, troponin of 104.  Cardiology and GI teams were consulted to assist with patient's management.  Patient was transfused 2 units of blood transfusion. S/P EGD/colonoscopy on 03/02/2024.  Hospital course is significant for development of A-fib with RVR, started on amiodarone  drip.  Currently remains in normal sinus rhythm.  Patient is on oral amiodarone .  Toprol -XL has been added.  03/09/2024: Patient seen alongside patient's son.  Patient is not keen on being discharged back home.  Patient tells me that she was ordered the medications per Dr. Timmy, she will want to stay in the hospital and know how her body will react to the medications.  Apparently, patient was given IV iron  250 mg and subcutaneous Aranesp  300 mcg.  Hopefully, patient will be discharged back home tomorrow.  03/10/2024: Patient seen alongside patient's son, daughter and patient's significant.  Patient reports his lower extremity.  Patient has significant aortic stenosis.  Last chest x-ray revealed pulmonary edema.  Elevated cardiac BNP.  Will hold Toprol -XL.  Will change oral diuretics to IV Lasix .  Will obtain another chest x-ray.  Communicated with cardiology team, Dr. Lynwood Schilling.  Dr. Schilling is currently enough to see the patient.  Will also consult the palliative care team.  Discussed above with patient's children.  All  questions answered.  03/11/2024: Patient seen alongside patient's son and daughter.  Patient feels much better today.  Cardiology and palliative care input is appreciated.  IV Lasix  is held after second dose.  For oral Lasix  tomorrow (40 mg p.o. once daily).  03/12/2024: Patient seen.  Patient continues to improve.  Patient is not on oral Lasix .  Discharge patient home once cleared for discharge by the cardiology team.    Assessment & Plan:  Principal Problem:   Symptomatic anemia Active Problems:   Endometrial cancer (HCC)   Hypothyroidism   PAF (paroxysmal atrial fibrillation) (HCC)   Severe aortic stenosis   Coronary artery disease involving native coronary artery of native heart with angina pectoris   Elevated troponin   Chronic heart failure with preserved ejection fraction (HFpEF) (HCC)   GI bleeding   Acute on chronic diastolic heart failure (HCC)  Symptomatic anemia/GI bleed:  Presented with hemoglobin of 6.8 dark stool.  Given 2 units of blood transfusion.  Currently hemoglobin of 9. S/P  colonoscopy, EGD on 10/3 which showed 10 cm hiatal hernia, normal stomach/duodenum, erythematous mucosa in distal transverse colon, biopsy taken.  Started on mesalamine daily by GI.Eliquis /Plavix  on hold.  Stool on the colostomy bag appears less darker/ less red.GI [facility] nurse taught  on  putting mesalamine enema through her colostomy. But patient continued to have bloating/discomfort on the ostomy site.  Dr Rollin saw her today and recommended mesalamine enema once or twice a week 03/12/2024: Hemoglobin of 10.4 g/dL today.  Coronary artery disease/troponin elevation:  -Cardiology consulted and following.  She was following with cardiology as an outpatient and was planned for cardiac cath.  Troponins elevation Suspected  to Be from Demand Ischemia.  Cardiology not planning for additional work up  at this time due to her anemia.  Currently recommending to discontinue Plavix  permanently.   Echocardiogram showed EF of 55 to 60%, no wall motion abnormality 03/12/2024: Stable.  Cardiology is directing care.  History of severe AS:  -Echocardiogram showed mild aortic valve regurgitation, severe aortic stenosis.  Cardiology following.  Given few doses of  IV Lasix  here.  Currently appears euvolemic.  She declines further workup for her aortic stenosis. 03/10/2024: Patient is reporting CHF symptoms in the setting of severe AAS.  Communicated with cardiology team.  Cardiology team will see patient. 03/12/2024: Patient will follow-up with cardiology team on discharge.  Patient is known to Dr. Mona.  Paroxysmal A-fib:  -Monitor on telemetry.   Went into A-fib with RVR on 10/3, started on amiodarone  drip,now on oral amiodarone   Started on long-acting metoprolol .  She remains in normal sinus rhythm this morning.  Cardiology arranged for today outpatient cardiac monitor.  After discussion with GI and cardiology, we restarted Eliquis  at 2.5 mg twice daily from tomorrow 03/11/2024: Heart rate is controlled.  Chronic HFpEF:  Given few days of IV Lasix .  Appears near euvolemic status, given a dose of oral Lasix  this morning. 03/10/2024: DC oral Lasix .  Start IV Lasix .  Toprol -XL is on hold.  Patient is reporting CHF symptoms.  Hypertension:  : Currently blood pressure stable.  Monitor  History of endometrial carcinoma: Status post hysterectomy, radiation therapy, chemotherapy.  Follows with Dr. Timmy.  History of sigmoid colon obstruction, status post transverse loop colostomy.  Hypothyroidism: Continue Synthyroid  Hyperlipidemia: Takes Zetia  at home.  Debility/deconditioning: PT evaluation requested, recommended home health  Hypokalemia: - Potassium of 4 today.  Hypomagnesemia: - Magnesium  of 2.3 today.        DVT prophylaxis:apixaban  (ELIQUIS ) tablet 2.5 mg Start: 03/09/24 1000 SCDs Start: 03/01/24 0416 apixaban  (ELIQUIS ) tablet 2.5 mg     Code Status: Full Code  Family  Communication: Discussed with  son  at bedside on 10/9  Patient status:Inpatient  Patient is from :Home  Anticipated discharge un:Ynfz with home health  Estimated DC date: Likely tomorrow.   Consultants: Cardiology,GI  Procedures:EGD/colonoscopy  Antimicrobials:  Anti-infectives (From admission, onward)    None       Subjective: Patient seen alongside son. Dyspnea on exertion has resolved. No chest pain.  Objective: Vitals:   03/12/24 0420 03/12/24 0732 03/12/24 1153 03/12/24 1444  BP:  (!) 149/71 (!) 125/51 (!) 122/42  Pulse:  64 64 73  Resp:  (!) 23 20   Temp:  98.5 F (36.9 C) 98.3 F (36.8 C)   TempSrc:  Oral Oral   SpO2:  94% 95%   Weight: 55.2 kg     Height:        Intake/Output Summary (Last 24 hours) at 03/12/2024 1936 Last data filed at 03/12/2024 1800 Gross per 24 hour  Intake 354 ml  Output 1200 ml  Net -846 ml   Filed Weights   03/10/24 0500 03/11/24 0421 03/12/24 0420  Weight: 51.8 kg 52.4 kg 55.2 kg    Examination:   General exam: Not in any distress.  Awake and alert.   HEENT: Patient is pale. Respiratory system: Clear to auscultation. Cardiovascular system: S1 & S2 heard Gastrointestinal system: Abdomen is soft and nontender.   Central nervous system: Patient is awake and alert.   Data Reviewed: I have personally reviewed following labs and imaging studies  CBC: Recent Labs  Lab 03/09/24 0652 03/11/24 1208 03/12/24 0240  WBC 6.7 7.0 6.1  NEUTROABS 5.3 5.3  --   HGB 9.8* 10.2* 10.4*  HCT 31.4* 32.8* 33.4*  MCV 87.7 87.0 88.6  PLT 360 443* 393   Basic Metabolic Panel: Recent Labs  Lab 03/09/24 0258 03/11/24 1050 03/11/24 1208 03/12/24 0240  NA 138 139 135 136  K 4.3 3.3* 3.3* 4.0  CL 100 95* 97* 101  CO2 28 28 27 27   GLUCOSE 106* 106* 116* 110*  BUN 15 21 20 15   CREATININE 0.83 0.92 0.86 0.82  CALCIUM 8.8* 8.7* 8.7* 8.7*  MG  --   --  1.6* 2.3  PHOS  --   --  3.5 3.0     No results found for this or any  previous visit (from the past 240 hours).   Radiology Studies: No results found.     Scheduled Meds:  sodium chloride    Intravenous Once   amiodarone   100 mg Oral Daily   apixaban   2.5 mg Oral BID   ezetimibe   10 mg Oral Daily   folic acid   2 mg Oral Daily   furosemide   40 mg Oral Daily   levothyroxine   50 mcg Oral Daily   mesalamine  4 g Rectal Daily   metoprolol  succinate  50 mg Oral Daily   pantoprazole   40 mg Oral Daily   polyethylene glycol  17 g Oral Daily   sodium chloride  flush  3 mL Intravenous Q12H   traMADol   50 mg Oral Q12H   triamcinolone  cream   Topical Daily   Continuous Infusions:  Time spent: 35 minutes.   LOS: 11 days   Shannon Scott Chapel, MD [facility] P10/13/2025, 7:36 PM

## 2024-03-12 NOTE — Progress Notes (Signed)
 Progress Note  Patient Name: Shannon Scott Date of Encounter: 03/12/2024  Primary Cardiologist:   Vinie JAYSON Maxcy, MD   Subjective   BP 149/71.  Cr 0.82, Hgb 10.4.  Denies any chest pain.  Reports dyspnea improved.  Inpatient Medications    Scheduled Meds:  sodium chloride    Intravenous Once   amiodarone   100 mg Oral Daily   apixaban   2.5 mg Oral BID   ezetimibe   10 mg Oral Daily   folic acid   2 mg Oral Daily   furosemide   40 mg Oral Daily   levothyroxine   50 mcg Oral Daily   mesalamine  4 g Rectal Daily   metoprolol  succinate  50 mg Oral Daily   pantoprazole   40 mg Oral Daily   polyethylene glycol  17 g Oral Daily   sodium chloride  flush  3 mL Intravenous Q12H   traMADol   50 mg Oral Q12H   triamcinolone  cream   Topical Daily   Continuous Infusions:  PRN Meds: acetaminophen  **OR** acetaminophen , ondansetron  **OR** ondansetron  (ZOFRAN ) IV   Vital Signs    Vitals:   03/12/24 0030 03/12/24 0415 03/12/24 0420 03/12/24 0732  BP: 129/84 114/75  (!) 149/71  Pulse: 61 61  64  Resp: 17 15  (!) 23  Temp: 98.6 F (37 C) 97.6 F (36.4 C)  98.5 F (36.9 C)  TempSrc: Temporal Oral  Oral  SpO2: 95% 95%  94%  Weight:   55.2 kg   Height:        Intake/Output Summary (Last 24 hours) at 03/12/2024 0836 Last data filed at 03/11/2024 1732 Gross per 24 hour  Intake 120 ml  Output 550 ml  Net -430 ml   Filed Weights   03/10/24 0500 03/11/24 0421 03/12/24 0420  Weight: 51.8 kg 52.4 kg 55.2 kg    Telemetry    NSR - Personally Reviewed  ECG    NA - Personally Reviewed  Physical Exam   GEN: No  acute distress.   Neck: No  JVD Cardiac: RRR, 3/6 systolic murmur Respiratory:    CTAB GI: Soft, nontender, non-distended, normal bowel sounds  MS:  Mild leg edema; No deformity. Neuro:   Nonfocal  Psych: Oriented and appropriate   Labs    Chemistry Recent Labs  Lab 03/11/24 1050 03/11/24 1208 03/12/24 0240  NA 139 135 136  K 3.3* 3.3* 4.0  CL 95* 97*  101  CO2 28 27 27   GLUCOSE 106* 116* 110*  BUN 21 20 15   CREATININE 0.92 0.86 0.82  CALCIUM 8.7* 8.7* 8.7*  ALBUMIN  --  2.7* 2.6*  GFRNONAA >60 >60 >60  ANIONGAP 16* 11 8     Hematology Recent Labs  Lab 03/09/24 0652 03/11/24 1208 03/12/24 0240  WBC 6.7 7.0 6.1  RBC 3.58* 3.77* 3.77*  HGB 9.8* 10.2* 10.4*  HCT 31.4* 32.8* 33.4*  MCV 87.7 87.0 88.6  MCH 27.4 27.1 27.6  MCHC 31.2 31.1 31.1  RDW 16.2* 16.5* 16.7*  PLT 360 443* 393    Cardiac EnzymesNo results for input(s): TROPONINI in the last 168 hours. No results for input(s): TROPIPOC in the last 168 hours.   BNP Recent Labs  Lab 03/09/24 0652 03/11/24 1339  BNP 1,891.1* 1,253.4*     DDimer No results for input(s): DDIMER in the last 168 hours.   Radiology    DG CHEST PORT 1 VIEW Result Date: 03/10/2024 CLINICAL DATA:  CHF EXAM: PORTABLE CHEST 1 VIEW COMPARISON:  03/07/2024 FINDINGS:  Cardiac shadow is stable. Postsurgical changes are noted. Right chest wall port is seen in satisfactory position. Central vascular congestion is noted with slight increase in interstitial edema. Small effusions are noted bilaterally. IMPRESSION: Mild CHF with interstitial edema and small effusions. Electronically Signed   By: Oneil Devonshire M.D.   On: 03/10/2024 21:52    Cardiac Studies    Echo:    1. Left ventricular ejection fraction, by estimation, is 55 to 60%. Left  ventricular ejection fraction by PLAX is 55 %. The left ventricle has  normal function. The left ventricle has no regional wall motion  abnormalities. Left ventricular diastolic  function could not be evaluated.   2. Right ventricular systolic function is moderately reduced. The right  ventricular size is normal. There is moderately elevated pulmonary artery  systolic pressure. The estimated right ventricular systolic pressure is  49.0 mmHg.   3. Left atrial size was moderately dilated.   4. The mitral valve is degenerative. Mild to moderate mitral  valve  regurgitation. Moderate to severe mitral annular calcification.   5. Tricuspid valve regurgitation is mild to moderate.   6. The aortic valve is calcified. Aortic valve regurgitation is mild.  Severe aortic valve stenosis. Aortic valve area, by VTI measures 0.75 cm.  Aortic valve mean gradient measures 30.0 mmHg. Aortic valve Vmax measures  3.71 m/s. Peak gradient 55.1 mmHg,   DI 0.28.   7. The inferior vena cava is normal in size with <50% respiratory  variability, suggesting right atrial pressure of 8 mmHg.   Patient Profile     83 y.o. female  with a hx of CAD s/p CABG x 3 with LIMA-LAD, SVG-OM, SVG-RCA in 2005 and s/p DES to SVG-OM 2 in 03/2023, severe aortic stenosis, locally advanced endometrial cancer s/p chemoradiation, immunotherapy, hysterectomy with BSO, colonic mass with partial colectomy and colostomy, prior DVT, paroxysmal A-fib on eliquis , carotid artery stenosis, CVA, HTN, HLD, statin intolerance, lymphedema, asthma, hypothyroidism and who was seen for chest pain, and elevated troponins     Assessment & Plan   CAD: s/p CABG x 3.  Presented with chest pain and elevated troponin, suspected demand ischemia in setting of GI bleed, hemoglobin 6.8 on presentation.  Echo showed EF 55 to 60%.  Plavix  discontinued.  She has been restarted on Eliquis   Acute blood loss anemia : GI consulted, underwent EGD/colonoscopy.  Started on mesalamine.  Eliquis  and Plavix  initially on hold, she is now back on Eliquis  and hemoglobin stable.   Severe AS: Was evaluated last year and felt not to be good candidate, can be readdressed as outpatient once she recovers from acute illness   Acute on chronic HFpEF:   She had increased dyspnea  and received IV Lasix .  Net negative 9.8L on admission. Transition to p.o. Lasix  today   Pulmonary HTN:    Echo results as above.  Reduced RV function.  Medical management.   Paroxysmal A-Fib: She is currently wearing a 14-day monitor.  Continue low-dose  oral amiodarone .  She is back on Eliquis    Hypertension: Previously on toprol  XL 100 mg daily, dose reduced to 50 mg daily  For questions or updates, please contact [facility] Please consult [email or web address] for contact info under Cardiology/STEMI.   Signed, Lonni LITTIE Nanas, MD  03/12/2024, 8:36 AM

## 2024-03-13 ENCOUNTER — Encounter (HOSPITAL_COMMUNITY): Payer: Self-pay | Admitting: Gastroenterology

## 2024-03-13 DIAGNOSIS — D649 Anemia, unspecified: Secondary | ICD-10-CM | POA: Diagnosis not present

## 2024-03-13 DIAGNOSIS — I5033 Acute on chronic diastolic (congestive) heart failure: Secondary | ICD-10-CM | POA: Diagnosis not present

## 2024-03-13 DIAGNOSIS — I480 Paroxysmal atrial fibrillation: Secondary | ICD-10-CM | POA: Diagnosis not present

## 2024-03-13 DIAGNOSIS — I350 Nonrheumatic aortic (valve) stenosis: Secondary | ICD-10-CM | POA: Diagnosis not present

## 2024-03-13 LAB — CBC
HCT: 31.8 % — ABNORMAL LOW (ref 36.0–46.0)
Hemoglobin: 9.8 g/dL — ABNORMAL LOW (ref 12.0–15.0)
MCH: 27.6 pg (ref 26.0–34.0)
MCHC: 30.8 g/dL (ref 30.0–36.0)
MCV: 89.6 fL (ref 80.0–100.0)
Platelets: 401 K/uL — ABNORMAL HIGH (ref 150–400)
RBC: 3.55 MIL/uL — ABNORMAL LOW (ref 3.87–5.11)
RDW: 17.1 % — ABNORMAL HIGH (ref 11.5–15.5)
WBC: 6.5 K/uL (ref 4.0–10.5)
nRBC: 0 % (ref 0.0–0.2)

## 2024-03-13 LAB — BASIC METABOLIC PANEL WITH GFR
Anion gap: 10 (ref 5–15)
BUN: 19 mg/dL (ref 8–23)
CO2: 27 mmol/L (ref 22–32)
Calcium: 8.4 mg/dL — ABNORMAL LOW (ref 8.9–10.3)
Chloride: 99 mmol/L (ref 98–111)
Creatinine, Ser: 0.87 mg/dL (ref 0.44–1.00)
GFR, Estimated: >60 mL/min (ref >60)
Glucose, Bld: 93 mg/dL (ref 70–99)
Potassium: 3.8 mmol/L (ref 3.5–5.1)
Sodium: 136 mmol/L (ref 135–145)

## 2024-03-13 LAB — MAGNESIUM: Magnesium: 2.1 mg/dL (ref 1.7–2.4)

## 2024-03-13 MED ORDER — FUROSEMIDE 20 MG PO TABS
20.0000 mg | ORAL_TABLET | Freq: Every day | ORAL | Status: DC
  Administered 2024-03-14: 20 mg via ORAL
  Filled 2024-03-13: qty 1

## 2024-03-13 MED ORDER — DARBEPOETIN ALFA 300 MCG/0.6ML IJ SOSY: 300.0000 ug | PREFILLED_SYRINGE | Freq: Once | INTRAMUSCULAR | Status: DC

## 2024-03-13 NOTE — Care Management Important Message (Signed)
 Important Message  Patient Details  Name: Shannon Scott MRN: [id number] Date of Birth: 17-Jun-1940   Important Message Given:  Yes - Medicare IM     Vonzell Arrie Sharps 03/13/2024, 10:34 AM

## 2024-03-13 NOTE — Addendum Note (Signed)
 Addendum  created 03/13/24 1247 by Lucious Debby BRAVO, MD   Intraprocedure Event edited, Intraprocedure Staff edited

## 2024-03-13 NOTE — TOC Progression Note (Addendum)
 Transition of Care [facility]) - Progression Note    Patient Details  Name: Shannon Scott MRN: [id number] Date of Birth: November 30, 1940  Transition of [facility]) CM/SW Contact  Waddell Barnie Rama, RN Phone Number: 03/13/2024, 11:09 AM  Clinical Narrative:    NCM spoke with patient and son at bedside, they would like to have a [facility] for CHF disease management, and to check on her colostomy and check her LE's.   Son states they had [facility] before and liked them and would like to have them again.  Per PT eval no HHPT f/u needed.    Expected Discharge Plan: Home/Self Care Barriers to Discharge: Continued Medical Work up               Expected Discharge Plan and Services In-house Referral: NA Discharge Planning Services: CM Consult Post Acute Care Choice: NA Living arrangements for the past 2 months: Single Family Home                 DME Arranged: N/A DME Agency: NA       HH Arranged: NA           Social Drivers of Health (SDOH) Interventions SDOH Screenings   Food Insecurity: No Food Insecurity (03/01/2024)  Housing: Low Risk  (03/01/2024)  Transportation Needs: No Transportation Needs (03/01/2024)  Utilities: Not At Risk (03/01/2024)  Depression (PHQ2-9): Low Risk  (02/23/2024)  Social Connections: Moderately Integrated (03/01/2024)  Tobacco Use: Low Risk  (03/01/2024)    Readmission Risk Interventions    03/02/2024   12:23 PM  Readmission Risk Prevention Plan  Transportation Screening Complete  PCP or Specialist Appt within 3-5 Days Complete  HRI or Home Care Consult Complete  Palliative Care Screening Not Applicable  Medication Review (RN Care Manager) Complete

## 2024-03-13 NOTE — Progress Notes (Addendum)
 03/13/2024 8:11 AM -----------------------------------------------------------[facility]--------------------------------------------------- D(Data) A(Action) R(response)     Data: Per notes pt. Needs home health at discharge.    Action: Secure chat sent to MD and RN for [facility] orders and [facility] consult.    Response: Acknowledged by staff members.   3:43 PM After Secure Chat with primary team, MD and ICM. Advised no need for HH at this time.    Maricia Scotti, RN [employer]

## 2024-03-13 NOTE — Progress Notes (Signed)
 Progress Note  Patient Name: Shannon Scott Date of Encounter: 03/13/2024  Primary Cardiologist:   Vinie JAYSON Maxcy, MD   Subjective   Denies any chest pain.  Reports dyspnea improved.  Inpatient Medications    Scheduled Meds:  sodium chloride    Intravenous Once   amiodarone   100 mg Oral Daily   apixaban   2.5 mg Oral BID   ezetimibe   10 mg Oral Daily   folic acid   2 mg Oral Daily   furosemide   40 mg Oral Daily   levothyroxine   50 mcg Oral Daily   mesalamine  4 g Rectal Daily   metoprolol  succinate  50 mg Oral Daily   pantoprazole   40 mg Oral Daily   polyethylene glycol  17 g Oral Daily   sodium chloride  flush  3 mL Intravenous Q12H   traMADol   50 mg Oral Q12H   triamcinolone  cream   Topical Daily   Continuous Infusions:  PRN Meds: acetaminophen  **OR** acetaminophen , ondansetron  **OR** ondansetron  (ZOFRAN ) IV   Vital Signs    Vitals:   03/13/24 0034 03/13/24 0411 03/13/24 0431 03/13/24 0716  BP: 125/63 (!) 127/57  (!) 125/42  Pulse: (!) 58 (!) 58  60  Resp: 19 13 16 15   Temp: 97.8 F (36.6 C) 97.7 F (36.5 C)  98.5 F (36.9 C)  TempSrc: Oral Oral  Oral  SpO2: 96% 98%  98%  Weight:   56.1 kg   Height:        Intake/Output Summary (Last 24 hours) at 03/13/2024 1041 Last data filed at 03/13/2024 0900 Gross per 24 hour  Intake 472 ml  Output 1400 ml  Net -928 ml   Filed Weights   03/11/24 0421 03/12/24 0420 03/13/24 0431  Weight: 52.4 kg 55.2 kg 56.1 kg    Telemetry    NSR - Personally Reviewed  ECG    NA - Personally Reviewed  Physical Exam   GEN: No  acute distress.   Neck: No  JVD Cardiac: RRR, 3/6 systolic murmur Respiratory:    CTAB GI: Soft, nontender, non-distended, normal bowel sounds  MS:  Mild leg edema; No deformity. Neuro:   Nonfocal  Psych: Oriented and appropriate   Labs    Chemistry Recent Labs  Lab 03/11/24 1208 03/12/24 0240 03/13/24 0208  NA 135 136 136  K 3.3* 4.0 3.8  CL 97* 101 99  CO2 27 27 27    GLUCOSE 116* 110* 93  BUN 20 15 19   CREATININE 0.86 0.82 0.87  CALCIUM 8.7* 8.7* 8.4*  ALBUMIN 2.7* 2.6*  --   GFRNONAA >60 >60 >60  ANIONGAP 11 8 10      Hematology Recent Labs  Lab 03/11/24 1208 03/12/24 0240 03/13/24 0208  WBC 7.0 6.1 6.5  RBC 3.77* 3.77* 3.55*  HGB 10.2* 10.4* 9.8*  HCT 32.8* 33.4* 31.8*  MCV 87.0 88.6 89.6  MCH 27.1 27.6 27.6  MCHC 31.1 31.1 30.8  RDW 16.5* 16.7* 17.1*  PLT 443* 393 401*    Cardiac EnzymesNo results for input(s): TROPONINI in the last 168 hours. No results for input(s): TROPIPOC in the last 168 hours.   BNP Recent Labs  Lab 03/09/24 0652 03/11/24 1339  BNP 1,891.1* 1,253.4*     DDimer No results for input(s): DDIMER in the last 168 hours.   Radiology    No results found.   Cardiac Studies    Echo:    1. Left ventricular ejection fraction, by estimation, is 55 to 60%. Left  ventricular ejection fraction by PLAX is 55 %. The left ventricle has  normal function. The left ventricle has no regional wall motion  abnormalities. Left ventricular diastolic  function could not be evaluated.   2. Right ventricular systolic function is moderately reduced. The right  ventricular size is normal. There is moderately elevated pulmonary artery  systolic pressure. The estimated right ventricular systolic pressure is  49.0 mmHg.   3. Left atrial size was moderately dilated.   4. The mitral valve is degenerative. Mild to moderate mitral valve  regurgitation. Moderate to severe mitral annular calcification.   5. Tricuspid valve regurgitation is mild to moderate.   6. The aortic valve is calcified. Aortic valve regurgitation is mild.  Severe aortic valve stenosis. Aortic valve area, by VTI measures 0.75 cm.  Aortic valve mean gradient measures 30.0 mmHg. Aortic valve Vmax measures  3.71 m/s. Peak gradient 55.1 mmHg,   DI 0.28.   7. The inferior vena cava is normal in size with <50% respiratory  variability, suggesting right  atrial pressure of 8 mmHg.   Patient Profile     83 y.o. female  with a hx of CAD s/p CABG x 3 with LIMA-LAD, SVG-OM, SVG-RCA in 2005 and s/p DES to SVG-OM 2 in 03/2023, severe aortic stenosis, locally advanced endometrial cancer s/p chemoradiation, immunotherapy, hysterectomy with BSO, colonic mass with partial colectomy and colostomy, prior DVT, paroxysmal A-fib on eliquis , carotid artery stenosis, CVA, HTN, HLD, statin intolerance, lymphedema, asthma, hypothyroidism and who was seen for chest pain, and elevated troponins     Assessment & Plan   CAD: s/p CABG x 3.  Presented with chest pain and elevated troponin, suspected demand ischemia in setting of GI bleed, hemoglobin 6.8 on presentation.  Echo showed EF 55 to 60%.  Plavix  discontinued.  She has been restarted on Eliquis   Acute blood loss anemia : GI consulted, underwent EGD/colonoscopy.  Started on mesalamine.  Eliquis  and Plavix  initially on hold, she is now back on Eliquis  and hemoglobin stable.   Severe AS: Was evaluated last year and felt not to be good candidate, can be readdressed as outpatient once she recovers from acute illness   Acute on chronic HFpEF:   She had increased dyspnea  and received IV Lasix .  Net negative 10.8L on admission. Transitioned to p.o. Lasix     Pulmonary HTN:    Echo results as above.  Reduced RV function.  Medical management.   Paroxysmal A-Fib: She is currently wearing a 14-day monitor.  Continue low-dose oral amiodarone .  She is back on Eliquis , on reduced dose given age and weight   Hypertension: Previously on toprol  XL 100 mg daily, dose reduced to 50 mg daily   [facility] will sign off.   Medication Recommendations: Eliquis  2.5 mg twice daily, amiodarone  100 mg daily, Lasix  20 mg daily, Toprol -XL 50 mg daily, continue Zetia  and Repatha .  Would discontinue Plavix  Other recommendations (labs, testing, etc): BMET in 1 week Follow up as an outpatient: Appointment scheduled on  10/21   For questions or updates, please contact [facility] Please consult [email or web address] for contact info under Cardiology/STEMI.   Signed, Lonni LITTIE Nanas, MD  03/13/2024, 10:41 AM

## 2024-03-13 NOTE — Progress Notes (Signed)
 Physical Therapy Treatment Patient Details Name: Shannon Scott MRN: [id number] DOB: [birth year] Today's Date: 03/13/2024   History of Present Illness 83 yo F adm 02/29/24 for low Hgb with anemia, GIB. PMHx: CAD s/p CABG, severe aortic stenosis, endometrial CA, hysterectomy, colostomy, Afib, HTN, HLD, HFpEF, lymphedema, asthma    PT Comments  Pt pleasant and reports fatigue without SOB. Pt continues to walk laps in room but with decreased distance and tolerance compared to most recent sessions. Pt educated for seated HEP and encouraged to be OOB for meals and increase walking in room with mobility specialists. Return home remains appropriate.  VSS    If plan is discharge home, recommend the following: A little help with bathing/dressing/bathroom;Assistance with cooking/housework;Assist for transportation;Help with stairs or ramp for entrance   Can travel by private vehicle        Equipment Recommendations  None recommended by PT    Recommendations for Other Services       Precautions / Restrictions Precautions Precautions: Fall;Other (comment) Recall of Precautions/Restrictions: Intact Precaution/Restrictions Comments: ostomy, port Lt upper chest     Mobility  Bed Mobility Overal bed mobility: Modified Independent       Supine to sit: HOB elevated, Used rails, Modified independent (Device/Increase time)     General bed mobility comments: HOB 20 degrees, rails, increased time    Transfers Overall transfer level: Modified independent                 General transfer comment: rise from EOB and lower to chair with UB support. denied repeated trials    Ambulation/Gait Ambulation/Gait assistance: Supervision Gait Distance (Feet): 90 Feet Assistive device: Rolling walker (2 wheels) Gait Pattern/deviations: Step-through pattern, Decreased stride length   Gait velocity interpretation: 1.31 - 2.62 ft/sec, indicative of limited community ambulator   General Gait  Details: slow, steady gait with RW, preferred laps in room, denied increased distance   Stairs             Wheelchair Mobility     Tilt Bed    Modified Rankin (Stroke Patients Only)       Balance Overall balance assessment: Needs assistance Sitting-balance support: No upper extremity supported, Feet supported Sitting balance-Leahy Scale: Good     Standing balance support: Bilateral upper extremity supported, During functional activity, Reliant on assistive device for balance Standing balance-Leahy Scale: Poor Standing balance comment: RW for gait                            Communication Communication Communication: No apparent difficulties  Cognition Arousal: Alert Behavior During Therapy: WFL for tasks assessed/performed   PT - Cognitive impairments: No apparent impairments                         Following commands: Intact      Cueing Cueing Techniques: Verbal cues  Exercises General Exercises - Lower Extremity Long Arc Quad: AROM, Both, Seated, Strengthening, 20 reps Hip Flexion/Marching: AROM, Both, Seated, Strengthening, 20 reps    General Comments        Pertinent Vitals/Pain Pain Assessment Pain Assessment: No/denies pain    Home Living                          Prior Function            PT Goals (current goals can now be found in the care  plan section) Progress towards PT goals: Progressing toward goals    Frequency    Min 1X/week      PT Plan      Co-evaluation              AM-PAC PT 6 Clicks Mobility   Outcome Measure  Help needed turning from your back to your side while in a flat bed without using bedrails?: None Help needed moving from lying on your back to sitting on the side of a flat bed without using bedrails?: A Little Help needed moving to and from a bed to a chair (including a wheelchair)?: A Little Help needed standing up from a chair using your arms (e.g., wheelchair or  bedside chair)?: A Little Help needed to walk in hospital room?: A Little Help needed climbing 3-5 steps with a railing? : A Little 6 Click Score: 19    End of Session   Activity Tolerance: Patient tolerated treatment well Patient left: in chair;with call bell/phone within reach Nurse Communication: Mobility status PT Visit Diagnosis: Difficulty in walking, not elsewhere classified (R26.2);Muscle weakness (generalized) (M62.81)     Time: 8867-8843 PT Time Calculation (min) (ACUTE ONLY): 24 min  Charges:    $Gait Training: 8-22 mins $Therapeutic Exercise: 8-22 mins PT General Charges $$ ACUTE PT VISIT: 1 Visit                     Lenoard SQUIBB, PT Acute Rehabilitation Services Office: [phone]    Lenoard NOVAK Shannon Scott 03/13/2024, 12:52 PM

## 2024-03-13 NOTE — Progress Notes (Signed)
 Shannon Scott is still in the hospital.  I am not sure as to why she is still in the hospital although there might be issues with her heart rate.  I know that cardiology is following her closely.  There is no obvious bleeding.  Her ostomy output looks okay.  I see no blood in the ostomy output.  Her white cell count is 6.5.  Hemoglobin 9.8.  Platelet count 401,000.  I will go ahead and give her a dose of Aranesp .  I think this will certainly help.  I know she has gotten IV iron .  I think this will help.  She might need more but this could be done in the outpatient setting.  It sounds like she might be able to go home today.  I know she has a severe aortic stenosis.  She is not sure what is going be done for this.   Her vital signs show temperature of 97.7.  Pulse 58.  Blood pressure 127/57.  Her lungs sound pretty clear bilaterally.  Cardiac exam regular rate and rhythm.  She has a 306 systolic ejection murmur.  Abdomen is soft.  She has the ostomy.  Output in looks okay without any melena.  Extremities shows no clubbing, cyanosis or edema.  Again, [name] came in with marked anemia.  She had bleeding into the ostomy.  This looks like it is resolved.  She got IV iron .  She got Procrit .  I will going give her a dose of Aranesp  today.  If she is discharged, we will probably plan to get her back to the office next week.   Jeralyn Crease, MD  Romans 1:16

## 2024-03-13 NOTE — Progress Notes (Signed)
 PROGRESS NOTE  Shannon Scott  DOB: 02-20-1941  PCP: Elliot Charm, MD FMW:[id number]  DOA: 02/29/2024  LOS: 12 days  Hospital Day: 14  Subjective: Patient was seen and examined this morning. Comfortable in bed.  Not in distress.  No longer having abdominal pain or bleeding.  Colostomy with brown stool.  Son at bedside.  He states he is making preparation at home to receive her tomorrow.  And hence asked to delay discharge till tomorrow. In the last 24 hours, afebrile, hemodynamically stable, breathing room air Labs with hemoglobin 8.8.  Given a dose of Aranesp  by Dr. Timmy this morning No obvious bleeding from ostomy site.  Brief narrative: Shannon Scott is a 83 y.o. female with PMH significant for HTN, HLD, CAD s/p CABG, stent, severe aortic stenosis, paroxysmal A-fib on Eliquis , chronic diastolic CHF, bilateral carotid artery disease, h/o CVA, hypothyroidism, locally advanced endometrial cancer s/p chemoradiation, immunotherapy s/p hysterectomy with BSO, colonic mass s/p colectomy and colostomy, chronic anemia, lymphedema 10/1, patient was seen in cardiology office for chest pressure triggered by physical exertion.  Was planned for elective cardiac cath on 10/6.  But routine labs showed hemoglobin significant low at 6.7 and hence sent to the ED on 10/2.  In the ED, hemoglobin was 6.7, stool was dark FOBT positive Troponin 104 2 units of PRBC transfusion given Admitted to [facility] Cardiology and GI were consulted 10/3, underwent EGD/colonoscopy.    Hospital course was complicated by A-fib with RVR for which she was started on amiodarone  drip Patient also seen by oncology and palliative care team. Ultimately plan for discharge home with home health services   Assessment and plan: Severe symptomatic anemia Acute GI bleeding Diversion colitis Presented with a low hemoglobin of 6.7, FOBT positive dark stools out of colostomy.  2 units PRBC transfusion given.  10/3,  unremarkable EGD.   10/3, colonoscopy showed all diffuse area of severely erythematous mucosa in the distal transverse colon, biopsied.  Per GI, it appeared like diversion colitis.  As a treatment to this, patient was started on mesalamine enema daily.  Patient has been taught by GI and ostomy nurse to put mesalamine enema through her colostomy.  GI recommended to use it once or twice a week and potentially use it as needed when she sees bleeding. No more bleeding from ostomy noted.   Improving hemoglobin trend as below.   Eliquis /Plavix  plan as below Recent Labs    07/28/23 1133 07/28/23 1204 08/25/23 1359 08/29/23 2004 10/14/23 1323 10/14/23 1324 11/16/23 1120 12/29/23 1534 12/29/23 1535 01/26/24 1525 01/26/24 1526 02/29/24 1606 03/01/24 1135 03/01/24 1841 03/05/24 1106 03/09/24 0652 03/11/24 1208 03/12/24 0240 03/13/24 0208  HGB  --   --  9.1*   < > 8.6*  --  8.2*   < >  --  10.5*  --    < >  --    < > 9.4* 9.8* 10.2* 10.4* 9.8*  MCV  --   --  86.1   < > 86.6  --  87.2   < >  --  86.1  --    < >  --    < > 88.1 87.7 87.0 88.6 89.6  VITAMINB12  --  348  --   --   --   --   --   --  254  --   --   --   --   --   --   --   --   --   --  FOLATE  --   --   --   --   --   --   --   --  19.0  --   --   --   --   --   --   --   --   --   --   FERRITIN  --  77 117  --  294  --  152  --   --   --  104  --  114  --   --  101  --   --   --   TIBC  --  252 246*  --  249*  --  249*  --   --  270  --   --  255  --   --  277  --   --   --   IRON   --  23* 27*  --  34  --  36  --   --  20*  --   --  29  --   --  28  --   --   --   RETICCTPCT 1.1  --  1.2  --   --  1.1  --   --   --   --  0.8  --  2.8  --   --   --   --   --   --    < > = values in this interval not displayed.   Elevated troponin H/o CAD s/p CABG, stent HLD Initially elevated troponin likely secondary to demand ischemia.  Echo without WMA, EF 55 to 60% Per cardiology, no additional ischemia workup is needed Given GI  bleeding, cardiology recommended to discontinue Plavix  indefinitely Continue Zetia , Intolerant to statin  Acute exacerbation of diastolic CHF  known severe aortic stenosis HTN In the hospital, patient had CHF symptoms in the setting of severe AS.  Patient declines further workup for AS.  As needed Lasix  was used. To continue to follow-up with cardiology as an outpatient Per cardiology, at discharge, to continue Lasix  20 mg daily  A-fib with RVR H/o paroxysmal A-fib Went to RVR on 10/3, started on amiodarone  drip and subsequently switched to oral amiodarone . Heart rate remains controlled Cardiology has arranged for outpatient cardiac monitor. At discharge, to continue amiodarone  100 mg daily and Toprol  50 mg daily Has been resumed on Eliquis  2.5 mg twice daily  B/L carotid artery disease h/o CVA Eliquis  and Zetia  as above Continue Repatha  as an outpatient  Hypokalemia/hypomagnesemia Improved with replacement Recent Labs  Lab 03/09/24 0258 03/11/24 1050 03/11/24 1208 03/12/24 0240 03/13/24 0208  K 4.3 3.3* 3.3* 4.0 3.8  MG  --   --  1.6* 2.3 2.1  PHOS  --   --  3.5 3.0  --    Hypothyroidism Continue Synthroid   Locally advanced endometrial cancer  s/p chemoradiation, immunotherapy  s/p hysterectomy with BSO Follows up with Dr. Timmy  Colonic mass H/o sigmoid colon obstruction s/p colectomy and transverse loop colostomy Colostomy care as above  Impaired mobility Seen by PT. no follow-up recommended   PT Orders: Active   PT Follow up Rec: No Pt Follow Up10/14/2025 1251   Goals of care   Code Status: Full Code     DVT prophylaxis:  apixaban  (ELIQUIS ) tablet 2.5 mg Start: 03/09/24 1000 SCDs Start: 03/01/24 0416 apixaban  (ELIQUIS ) tablet 2.5 mg   Antimicrobials: None Fluid: None Consultants: Cardiology Family Communication: Son at bedside  Status: Inpatient Level of care: Currently  progressive.  Can transfer to Telemetry Cardiac   Patient is  from: Home Needs to continue in-hospital care: Stable for discharge to home.  Son states that they are making her care arrangements at home and expect a discharge tomorrow.    Diet:  Diet Order             Diet Heart Room service appropriate? Yes; Fluid consistency: Thin  Diet effective now                   Scheduled Meds:  sodium chloride    Intravenous Once   amiodarone   100 mg Oral Daily   apixaban   2.5 mg Oral BID   ezetimibe   10 mg Oral Daily   folic acid   2 mg Oral Daily   [START ON 03/14/2024] furosemide   20 mg Oral Daily   levothyroxine   50 mcg Oral Daily   mesalamine  4 g Rectal Daily   metoprolol  succinate  50 mg Oral Daily   pantoprazole   40 mg Oral Daily   polyethylene glycol  17 g Oral Daily   sodium chloride  flush  3 mL Intravenous Q12H   traMADol   50 mg Oral Q12H   triamcinolone  cream   Topical Daily    PRN meds: acetaminophen  **OR** acetaminophen , ondansetron  **OR** ondansetron  (ZOFRAN ) IV   Infusions:    Antimicrobials: Anti-infectives (From admission, onward)    None       Objective: Vitals:   03/13/24 0716 03/13/24 1141  BP: (!) 125/42 (!) 126/59  Pulse: 60 62  Resp: 15 20  Temp: 98.5 F (36.9 C) 98.5 F (36.9 C)  SpO2: 98% 95%    Intake/Output Summary (Last 24 hours) at 03/13/2024 1310 Last data filed at 03/13/2024 1100 Gross per 24 hour  Intake 236 ml  Output 2350 ml  Net -2114 ml   Filed Weights   03/11/24 0421 03/12/24 0420 03/13/24 0431  Weight: 52.4 kg 55.2 kg 56.1 kg   Weight change: 0.9 kg Body mass index is 23.37 kg/m.   Physical Exam: General exam: Pleasant, elderly Caucasian female.  Not in distress Skin: No rashes, lesions or ulcers. HEENT: Atraumatic, normocephalic, no obvious bleeding Lungs: Clear to auscultation bilaterally,  CVS: S1, S2, no murmur,   GI/Abd: Soft, nontender, nondistended, bowel sound present, colostomy with brown stool CNS: Alert, awake, oriented x 3 Psychiatry: Sad  affect Extremities: No pedal edema, no calf tenderness,   Data Review: I have personally reviewed the laboratory data and studies available.  F/u labs  Unresulted Labs (From admission, onward)    None       Signed, Chapman Rota, MD [facility] 03/13/2024

## 2024-03-14 ENCOUNTER — Other Ambulatory Visit (HOSPITAL_COMMUNITY): Payer: Self-pay

## 2024-03-14 DIAGNOSIS — D649 Anemia, unspecified: Secondary | ICD-10-CM | POA: Diagnosis not present

## 2024-03-14 LAB — BASIC METABOLIC PANEL WITH GFR
Anion gap: 8 (ref 5–15)
BUN: 18 mg/dL (ref 8–23)
CO2: 30 mmol/L (ref 22–32)
Calcium: 8.5 mg/dL — ABNORMAL LOW (ref 8.9–10.3)
Chloride: 99 mmol/L (ref 98–111)
Creatinine, Ser: 1.02 mg/dL — ABNORMAL HIGH (ref 0.44–1.00)
GFR, Estimated: 55 mL/min — ABNORMAL LOW (ref >60)
Glucose, Bld: 107 mg/dL — ABNORMAL HIGH (ref 70–99)
Potassium: 3.8 mmol/L (ref 3.5–5.1)
Sodium: 137 mmol/L (ref 135–145)

## 2024-03-14 MED ORDER — METOPROLOL SUCCINATE ER 50 MG PO TB24
50.0000 mg | ORAL_TABLET | Freq: Every day | ORAL | 0 refills | Status: DC
  Filled 2024-03-14: qty 30, 30d supply, fill #0

## 2024-03-14 MED ORDER — AMIODARONE HCL 200 MG PO TABS
100.0000 mg | ORAL_TABLET | Freq: Every day | ORAL | 0 refills | Status: DC
  Filled 2024-03-14: qty 15, 30d supply, fill #0

## 2024-03-14 MED ORDER — APIXABAN 2.5 MG PO TABS
2.5000 mg | ORAL_TABLET | Freq: Two times a day (BID) | ORAL | 0 refills | Status: DC
  Filled 2024-03-14: qty 60, 30d supply, fill #0

## 2024-03-14 MED ORDER — PANTOPRAZOLE SODIUM 40 MG PO TBEC
40.0000 mg | DELAYED_RELEASE_TABLET | Freq: Every day | ORAL | 0 refills | Status: DC
  Filled 2024-03-14: qty 30, 30d supply, fill #0

## 2024-03-14 MED ORDER — FOLIC ACID 1 MG PO TABS
2.0000 mg | ORAL_TABLET | Freq: Every day | ORAL | 0 refills | Status: DC
Start: 2024-03-14 — End: 2024-04-04
  Filled 2024-03-14: qty 30, 15d supply, fill #0

## 2024-03-14 MED ORDER — MESALAMINE 4 G RE ENEM
4.0000 g | ENEMA | RECTAL | 0 refills | Status: AC
  Filled 2024-03-14: qty 420, 21d supply, fill #0

## 2024-03-14 MED ORDER — FUROSEMIDE 20 MG PO TABS
20.0000 mg | ORAL_TABLET | Freq: Every day | ORAL | 0 refills | Status: DC
  Filled 2024-03-14: qty 30, 30d supply, fill #0

## 2024-03-14 NOTE — Care Management Important Message (Signed)
 Important Message  Patient Details  Name: Shannon Scott MRN: [id number] Date of Birth: 23-Jun-1940   Important Message Given:  Yes - Medicare IM     Vonzell Arrie Sharps 03/14/2024, 12:01 PM

## 2024-03-14 NOTE — TOC Transition Note (Signed)
 Transition of Care [facility]) - Discharge Note   Patient Details  Name: Shannon Scott MRN: [id number] Date of Birth: 02/14/1941  Transition of Care [facility] CM/SW Contact:  Waddell Barnie Rama, RN Phone Number: 03/14/2024, 11:04 AM   Clinical Narrative:    For dc today, she has transportation at dc.  NCM notified Artavia with [employer].     Barriers to Discharge: Continued Medical Work up   Patient Goals and CMS Choice Patient states their goals for this hospitalization and ongoing recovery are:: return home   Choice offered to / list presented to : NA      Discharge Placement                       Discharge Plan and Services Additional resources added to the After Visit Summary for   In-house Referral: NA Discharge Planning Services: CM Consult Post Acute Care Choice: NA          DME Arranged: N/A DME Agency: NA       HH Arranged: NA          Social Drivers of Health (SDOH) Interventions SDOH Screenings   Food Insecurity: No Food Insecurity (03/01/2024)  Housing: Low Risk  (03/01/2024)  Transportation Needs: No Transportation Needs (03/01/2024)  Utilities: Not At Risk (03/01/2024)  Depression (PHQ2-9): Low Risk  (02/23/2024)  Social Connections: Moderately Integrated (03/01/2024)  Tobacco Use: Low Risk  (03/01/2024)     Readmission Risk Interventions    03/02/2024   12:23 PM  Readmission Risk Prevention Plan  Transportation Screening Complete  PCP or Specialist Appt within 3-5 Days Complete  HRI or Home Care Consult Complete  Palliative Care Screening Not Applicable  Medication Review (RN Care Manager) Complete

## 2024-03-14 NOTE — Progress Notes (Signed)
 Discharge   Patient's son Norleen expressed verbal understanding of discharge POC.   Additional education included in AVS  Son stated that the GI provider did the education for enama/colostomy.   PIV removed.  Tele removed CCMD notified/Christelle .  Pressure dressings intact. Colostomy bag intact, Patient has Zio patch to left upper chest. CDI.  TOC meds Ready.  Patient incontinent related to lasix .  Unit leadership notified of Delay with discharge.  Daughter ETA 3pm.

## 2024-03-14 NOTE — Discharge Summary (Signed)
 Physician Discharge Summary  Shannon Scott FMW:[id number] DOB: [birth year] DOA: 02/29/2024  PCP: Shannon Charm, MD  Admit date: 02/29/2024 Discharge date: 03/14/2024  Admitted from: Home Discharge disposition: Home with [facility] RN  Recommendations at discharge:  Recommend to use mesalamine enema through colostomy once or twice a week and potentially use it as needed when bleeding. Continue Eliquis  2.5 mg twice daily.  Plavix  has been stopped. Continue amiodarone  100 mg daily,Toprol  50 mg daily, Lasix  20 mg daily  Subjective: Patient was seen and examined this morning. Lying on bed.  Not in distress.  Son not at bedside. Afebrile, hemodynamically stable Labs this morning unremarkable No obvious bleeding from ostomy site.  Brief narrative: Shannon Scott is a 83 y.o. female with PMH significant for HTN, HLD, CAD s/p CABG, stent, severe aortic stenosis, paroxysmal A-fib on Eliquis , chronic diastolic CHF, bilateral carotid artery disease, h/o CVA, hypothyroidism, locally advanced endometrial cancer s/p chemoradiation, immunotherapy s/p hysterectomy with BSO, colonic mass s/p colectomy and colostomy, chronic anemia, lymphedema 10/1, patient was seen in cardiology office for chest pressure triggered by physical exertion.  Was planned for elective cardiac cath on 10/6.  But routine labs showed hemoglobin significant low at 6.7 and hence sent to the ED on 10/2.  In the ED, hemoglobin was 6.7, stool was dark FOBT positive Troponin 104 2 units of PRBC transfusion given Admitted to [facility] Cardiology and GI were consulted 10/3, underwent EGD/colonoscopy.    Hospital course was complicated by A-fib with RVR for which she was started on amiodarone  drip Patient also seen by oncology and palliative care team. Ultimately plan for discharge home with home health services   Hospital course: Severe symptomatic anemia Acute GI bleeding Diversion colitis Presented with a low hemoglobin of  6.7, FOBT positive dark stools out of colostomy.  2 units PRBC transfusion given.  10/3, unremarkable EGD.   10/3, colonoscopy showed all diffuse area of severely erythematous mucosa in the distal transverse colon, biopsied.  Per GI, it appeared like diversion colitis.  As a treatment to this, patient was started on mesalamine enema .  Patient has been taught by GI and ostomy nurse to put mesalamine enema through her colostomy.  GI recommended to use it once or twice a week and potentially use it as needed when she sees bleeding. No more bleeding from ostomy noted.   Improving hemoglobin trend as below.   Eliquis /Plavix  plan as below Recent Labs    07/28/23 1133 07/28/23 1204 08/25/23 1359 08/29/23 2004 10/14/23 1323 10/14/23 1324 11/16/23 1120 12/29/23 1534 12/29/23 1535 01/26/24 1525 01/26/24 1526 02/29/24 1606 03/01/24 1135 03/01/24 1841 03/05/24 1106 03/09/24 0652 03/11/24 1208 03/12/24 0240 03/13/24 0208  HGB  --   --  9.1*   < > 8.6*  --  8.2*   < >  --  10.5*  --    < >  --    < > 9.4* 9.8* 10.2* 10.4* 9.8*  MCV  --   --  86.1   < > 86.6  --  87.2   < >  --  86.1  --    < >  --    < > 88.1 87.7 87.0 88.6 89.6  VITAMINB12  --  348  --   --   --   --   --   --  254  --   --   --   --   --   --   --   --   --   --  FOLATE  --   --   --   --   --   --   --   --  19.0  --   --   --   --   --   --   --   --   --   --   FERRITIN  --  77 117  --  294  --  152  --   --   --  104  --  114  --   --  101  --   --   --   TIBC  --  252 246*  --  249*  --  249*  --   --  270  --   --  255  --   --  277  --   --   --   IRON   --  23* 27*  --  34  --  36  --   --  20*  --   --  29  --   --  28  --   --   --   RETICCTPCT 1.1  --  1.2  --   --  1.1  --   --   --   --  0.8  --  2.8  --   --   --   --   --   --    < > = values in this interval not displayed.   Elevated troponin H/o CAD s/p CABG, stent HLD Initially elevated troponin likely secondary to demand ischemia.  Echo without WMA,  EF 55 to 60% Per cardiology, no additional ischemia workup is needed Given GI bleeding, cardiology recommended to discontinue Plavix  indefinitely Continue Zetia , Intolerant to statin  Acute exacerbation of diastolic CHF  known severe aortic stenosis HTN In the hospital, patient had CHF symptoms in the setting of severe AS.  Patient declines further workup for AS.  As needed Lasix  was used. To continue to follow-up with cardiology as an outpatient Per cardiology, at discharge, to continue Lasix  20 mg daily  A-fib with RVR H/o paroxysmal A-fib Went to RVR on 10/3, started on amiodarone  drip and subsequently switched to oral amiodarone . Heart rate remains controlled Cardiology has arranged for outpatient cardiac monitor. At discharge, to continue amiodarone  100 mg daily and Toprol  50 mg daily Has been resumed on Eliquis  2.5 mg twice daily  B/L carotid artery disease h/o CVA Eliquis  and Zetia  as above Continue Repatha  as an outpatient  Hypokalemia/hypomagnesemia Improved with replacement Recent Labs  Lab 03/11/24 1050 03/11/24 1208 03/12/24 0240 03/13/24 0208 03/14/24 0220  K 3.3* 3.3* 4.0 3.8 3.8  MG  --  1.6* 2.3 2.1  --   PHOS  --  3.5 3.0  --   --    Hypothyroidism Continue Synthroid   Locally advanced endometrial cancer  s/p chemoradiation, immunotherapy  s/p hysterectomy with BSO Follows up with Dr. Timmy  Colonic mass H/o sigmoid colon obstruction s/p colectomy and transverse loop colostomy Colostomy care as above  Impaired mobility Seen by PT. no follow-up recommended  Goals of care   Code Status: Full Code   Diet:  Diet Order             Diet general           Diet Heart Room service appropriate? Yes; Fluid consistency: Thin  Diet effective now  Nutritional status:  Body mass index is 21.08 kg/m.       Wounds:  -    Discharge Medications:   Allergies as of 03/14/2024       Reactions   Statins Other (See  Comments)   Myalgias and memory problems   Ciprofloxacin  Itching, Swelling, Other (See Comments)   Other reaction(s): swelling/redness at injection site        Medication List     STOP taking these medications    clopidogrel  75 MG tablet Commonly known as: PLAVIX    Tiadylt  ER 300 MG 24 hr capsule Generic drug: diltiazem        TAKE these medications    amiodarone  100 MG tablet Commonly known as: PACERONE  Take 1 tablet (100 mg total) by mouth daily.   apixaban  2.5 MG Tabs tablet Commonly known as: ELIQUIS  Take 1 tablet (2.5 mg total) by mouth 2 (two) times daily. What changed:  medication strength how much to take   ezetimibe  10 MG tablet Commonly known as: ZETIA  Take 10 mg by mouth daily.   folic acid  1 MG tablet Commonly known as: FOLVITE  Take 2 tablets (2 mg total) by mouth daily.   furosemide  20 MG tablet Commonly known as: LASIX  Take 1 tablet (20 mg total) by mouth daily. Start taking on: March 15, 2024 What changed: when to take this   levothyroxine  50 MCG tablet Commonly known as: SYNTHROID  Take 1 tablet (50 mcg total) by mouth daily.   mesalamine 4 g enema Commonly known as: ROWASA Place 60 mLs (4 g total) rectally every 3 (three) days.   metoprolol  succinate 50 MG 24 hr tablet Commonly known as: TOPROL -XL Take 1 tablet (50 mg total) by mouth daily. Take with or immediately following a meal.   nitroGLYCERIN  0.4 MG SL tablet Commonly known as: NITROSTAT  Place 1 tablet (0.4 mg total) under the tongue every 5 (five) minutes as needed for chest pain.   One A Day Women 50 Plus Chew Chew 1 each by mouth daily at 6 PM. One-A-Day Women's 50+ Gummies;  Chew one gummy by mouth every evening at 1800.   pantoprazole  40 MG tablet Commonly known as: PROTONIX  Take 1 tablet (40 mg total) by mouth daily.   polyethylene glycol 17 g packet Commonly known as: MIRALAX  / GLYCOLAX  Take 17 g by mouth daily as needed for severe constipation. What changed:  when to take this   Repatha  SureClick 140 MG/ML Soaj Generic drug: Evolocumab  INJECT 1 DOSE UNDER THE SKIN EVERY 14 DAYS   traMADol  50 MG tablet Commonly known as: ULTRAM  TAKE 2 TABLETS(100 MG) BY MOUTH TWICE DAILY   Vitamin B-12 5000 MCG Lozg Take 1 each by mouth in the morning.               Discharge Care Instructions  (From admission, onward)           Start     Ordered   03/14/24 0000  Discharge wound care:        03/14/24 1030             Follow ups:    Follow-up Information     Shannon Charm, MD Follow up on 03/12/2024.   Specialty: Internal Medicine Why: 1:30 for hospital follow up Contact information: [address] [place] [address] [phone]         Rollin Dover, MD Follow up.   Specialty: Gastroenterology Contact information: [address] [place] [address] [phone]  Discharge Instructions:   Discharge Instructions     Call MD for:  difficulty breathing, headache or visual disturbances   Complete by: As directed    Call MD for:  extreme fatigue   Complete by: As directed    Call MD for:  hives   Complete by: As directed    Call MD for:  persistant dizziness or light-headedness   Complete by: As directed    Call MD for:  persistant nausea and vomiting   Complete by: As directed    Call MD for:  severe uncontrolled pain   Complete by: As directed    Call MD for:  temperature >100.4   Complete by: As directed    Diet general   Complete by: As directed    Discharge instructions   Complete by: As directed    Recommendations at discharge:   Recommend to use mesalamine enema through colostomy once or twice a week and potentially use it as needed when bleeding.  Continue Eliquis  2.5 mg twice daily.  Plavix  has been stopped.  Continue amiodarone  100 mg daily,Toprol  50 mg daily, Lasix  20 mg daily  General discharge instructions: Follow with  Primary MD Shannon Charm, MD in 7 days  Please request your PCP  to go over your hospital tests, procedures, radiology results at the follow up. Please get your medicines reviewed and adjusted.  Your PCP may decide to repeat certain labs or tests as needed. Do not drive, operate heavy machinery, perform activities at heights, swimming or participation in water  activities or provide baby sitting services if your were admitted for syncope or siezures until you have seen by Primary MD or a Neurologist and advised to do so again. [place]  Controlled Substance Reporting System database was reviewed. Do not drive, operate heavy machinery, perform activities at heights, swim, participate in water  activities or provide baby-sitting services while on medications for pain, sleep and mood until your outpatient physician has reevaluated you and advised to do so again.  You are strongly recommended to comply with the dose, frequency and duration of prescribed medications. Activity: As tolerated with Full fall precautions use walker/cane & assistance as needed Avoid using any recreational substances like cigarette, tobacco, alcohol, or non-prescribed drug. If you experience worsening of your admission symptoms, develop shortness of breath, life threatening emergency, suicidal or homicidal thoughts you must seek medical attention immediately by calling 911 or calling your MD immediately  if symptoms less severe. You must read complete instructions/literature along with all the possible adverse reactions/side effects for all the medicines you take and that have been prescribed to you. Take any new medicine only after you have completely understood and accepted all the possible adverse reactions/side effects.  Wear Seat belts while driving. You were cared for by a hospitalist during your hospital stay. If you have any questions about your discharge medications or the care you received while you were in the  hospital after you are discharged, you can call the unit and ask to speak with the hospitalist or the covering physician. Once you are discharged, your primary care physician will handle any further medical issues. Please note that NO REFILLS for any discharge medications will be authorized once you are discharged, as it is imperative that you return to your primary care physician (or establish a relationship with a primary care physician if you do not have one).   Discharge wound care:   Complete by: As directed    Increase activity slowly  Complete by: As directed        Discharge Exam:   Vitals:   03/13/24 1900 03/14/24 0338 03/14/24 0348 03/14/24 0728  BP: (!) 118/41 118/67  (!) 124/58  Pulse: (!) 58 (!) 59  63  Resp: 20 18  18   Temp: 98.7 F (37.1 C) 97.6 F (36.4 C)  98.6 F (37 C)  TempSrc: Oral Oral  Oral  SpO2: 94% 97%  97%  Weight:   50.6 kg   Height:        Body mass index is 21.08 kg/m.   General exam: Pleasant, elderly Caucasian female.  Not in distress Skin: No rashes, lesions or ulcers. HEENT: Atraumatic, normocephalic, no obvious bleeding Lungs: Clear to auscultation bilaterally,  CVS: S1, S2, no murmur,   GI/Abd: Soft, nontender, nondistended, bowel sound present, colostomy with brown stool CNS: Alert, awake, oriented x 3 Psychiatry: Mood appropriate Extremities: No pedal edema, no calf tenderness,    The results of significant diagnostics from this hospitalization (including imaging, microbiology, ancillary and laboratory) are listed below for reference.    Procedures and Diagnostic Studies:   DG CHEST PORT 1 VIEW Result Date: 03/07/2024 CLINICAL DATA:  Follow-up CHF.  Shortness of breath. EXAM: PORTABLE CHEST 1 VIEW COMPARISON:  03/02/2024.  Chest CTA dated 03/04/2023. FINDINGS: Stable enlarged cardiac silhouette and prominent pulmonary vasculature and interstitial markings with Kerley lines. Stable small bilateral pleural effusions and mild patchy  right upper lobe opacity. Stable post CABG changes and right jugular catheter with its tip in the superior vena cava. Diffuse osteopenia. IMPRESSION: 1. Stable congestive heart failure with small bilateral pleural effusions. 2. Stable mild patchy right upper lobe opacity. This could represent pneumonia or alveolar edema. Electronically Signed   By: Elspeth Bathe M.D.   On: 03/07/2024 12:37     Labs:   Basic Metabolic Panel: Recent Labs  Lab 03/11/24 1050 03/11/24 1208 03/12/24 0240 03/13/24 0208 03/14/24 0220  NA 139 135 136 136 137  K 3.3* 3.3* 4.0 3.8 3.8  CL 95* 97* 101 99 99  CO2 28 27 27 27 30   GLUCOSE 106* 116* 110* 93 107*  BUN 21 20 15 19 18   CREATININE 0.92 0.86 0.82 0.87 1.02*  CALCIUM 8.7* 8.7* 8.7* 8.4* 8.5*  MG  --  1.6* 2.3 2.1  --   PHOS  --  3.5 3.0  --   --    GFR Estimated Creatinine Clearance: 32.1 mL/min (A) (by C-G formula based on SCr of 1.02 mg/dL (H)). Liver Function Tests: Recent Labs  Lab 03/11/24 1208 03/12/24 0240  ALBUMIN 2.7* 2.6*   No results for input(s): LIPASE, AMYLASE in the last 168 hours. No results for input(s): AMMONIA in the last 168 hours. Coagulation profile No results for input(s): INR, PROTIME in the last 168 hours.  CBC: Recent Labs  Lab 03/09/24 0652 03/11/24 1208 03/12/24 0240 03/13/24 0208  WBC 6.7 7.0 6.1 6.5  NEUTROABS 5.3 5.3  --   --   HGB 9.8* 10.2* 10.4* 9.8*  HCT 31.4* 32.8* 33.4* 31.8*  MCV 87.7 87.0 88.6 89.6  PLT 360 443* 393 401*   Cardiac Enzymes: No results for input(s): CKTOTAL, CKMB, CKMBINDEX, TROPONINI in the last 168 hours. BNP: Invalid input(s): POCBNP CBG: No results for input(s): GLUCAP in the last 168 hours. D-Dimer No results for input(s): DDIMER in the last 72 hours. Hgb A1c No results for input(s): HGBA1C in the last 72 hours. Lipid Profile No results for input(s): CHOL, HDL, LDLCALC,  TRIG, CHOLHDL, LDLDIRECT in the last 72 hours. Thyroid   function studies No results for input(s): TSH, T4TOTAL, T3FREE, THYROIDAB in the last 72 hours.  Invalid input(s): FREET3 Anemia work up No results for input(s): VITAMINB12, FOLATE, FERRITIN, TIBC, IRON , RETICCTPCT in the last 72 hours. Microbiology No results found for this or any previous visit (from the past 240 hours).  Time coordinating discharge: 45 minutes  Signed: Anderson Coppock  [facility] 03/14/2024, 10:30 AM

## 2024-03-14 NOTE — Progress Notes (Signed)
 TOC meds delivered to patient by Discharge RN.

## 2024-03-14 NOTE — Plan of Care (Signed)
  Problem: Education: Goal: Ability to identify signs and symptoms of gastrointestinal bleeding will improve Outcome: Progressing   Problem: Bowel/Gastric: Goal: Will show no signs and symptoms of gastrointestinal bleeding Outcome: Progressing   Problem: Fluid Volume: Goal: Will show no signs and symptoms of excessive bleeding Outcome: Progressing   Problem: Clinical Measurements: Goal: Complications related to the disease process, condition or treatment will be avoided or minimized Outcome: Progressing   Problem: Education: Goal: Knowledge of General Education information will improve Description: Including pain rating scale, medication(s)/side effects and non-pharmacologic comfort measures Outcome: Progressing   Problem: Clinical Measurements: Goal: Will remain free from infection Outcome: Progressing Goal: Respiratory complications will improve Outcome: Progressing Goal: Cardiovascular complication will be avoided Outcome: Progressing   Problem: Coping: Goal: Level of anxiety will decrease Outcome: Progressing   Problem: Elimination: Goal: Will not experience complications related to bowel motility Outcome: Progressing Goal: Will not experience complications related to urinary retention Outcome: Progressing   Problem: Pain Managment: Goal: General experience of comfort will improve and/or be controlled Outcome: Progressing   Problem: Safety: Goal: Ability to remain free from injury will improve Outcome: Progressing   Problem: Skin Integrity: Goal: Risk for impaired skin integrity will decrease Outcome: Progressing

## 2024-03-15 ENCOUNTER — Telehealth: Payer: Self-pay | Admitting: Hematology & Oncology

## 2024-03-15 NOTE — Telephone Encounter (Signed)
 Lvm for patient to return call for scheduling a follow up, pf and labs with Lauraine for recent hospitalization.

## 2024-03-20 ENCOUNTER — Ambulatory Visit: Attending: Emergency Medicine | Admitting: Emergency Medicine

## 2024-03-20 ENCOUNTER — Other Ambulatory Visit (HOSPITAL_COMMUNITY): Payer: Self-pay

## 2024-03-20 ENCOUNTER — Encounter: Payer: Self-pay | Admitting: Emergency Medicine

## 2024-03-20 VITALS — BP 120/50 | HR 69 | Ht 61.0 in | Wt 119.0 lb

## 2024-03-20 DIAGNOSIS — I350 Nonrheumatic aortic (valve) stenosis: Secondary | ICD-10-CM | POA: Diagnosis not present

## 2024-03-20 DIAGNOSIS — G459 Transient cerebral ischemic attack, unspecified: Secondary | ICD-10-CM | POA: Diagnosis not present

## 2024-03-20 DIAGNOSIS — I10 Essential (primary) hypertension: Secondary | ICD-10-CM | POA: Diagnosis not present

## 2024-03-20 DIAGNOSIS — I5032 Chronic diastolic (congestive) heart failure: Secondary | ICD-10-CM | POA: Diagnosis not present

## 2024-03-20 DIAGNOSIS — I639 Cerebral infarction, unspecified: Secondary | ICD-10-CM | POA: Diagnosis not present

## 2024-03-20 DIAGNOSIS — I471 Supraventricular tachycardia, unspecified: Secondary | ICD-10-CM | POA: Diagnosis not present

## 2024-03-20 DIAGNOSIS — Z951 Presence of aortocoronary bypass graft: Secondary | ICD-10-CM

## 2024-03-20 DIAGNOSIS — I25709 Atherosclerosis of coronary artery bypass graft(s), unspecified, with unspecified angina pectoris: Secondary | ICD-10-CM

## 2024-03-20 DIAGNOSIS — I480 Paroxysmal atrial fibrillation: Secondary | ICD-10-CM | POA: Diagnosis not present

## 2024-03-20 DIAGNOSIS — E785 Hyperlipidemia, unspecified: Secondary | ICD-10-CM | POA: Diagnosis not present

## 2024-03-20 DIAGNOSIS — I482 Chronic atrial fibrillation, unspecified: Secondary | ICD-10-CM | POA: Diagnosis not present

## 2024-03-20 MED ORDER — AMIODARONE HCL 200 MG PO TABS
100.0000 mg | ORAL_TABLET | Freq: Every day | ORAL | 3 refills | Status: AC
  Filled 2024-03-20 – 2024-04-08 (×2): qty 45, 90d supply, fill #0
  Filled 2024-07-04: qty 45, 90d supply, fill #1

## 2024-03-20 MED ORDER — EZETIMIBE 10 MG PO TABS
10.0000 mg | ORAL_TABLET | Freq: Every day | ORAL | 3 refills | Status: AC
  Filled 2024-03-20 – 2024-04-13 (×2): qty 90, 90d supply, fill #0
  Filled 2024-07-05: qty 90, 90d supply, fill #1

## 2024-03-20 MED ORDER — METOPROLOL SUCCINATE ER 25 MG PO TB24
12.5000 mg | ORAL_TABLET | Freq: Every day | ORAL | 3 refills | Status: AC
  Filled 2024-03-20: qty 45, 90d supply, fill #0
  Filled 2024-06-11: qty 45, 90d supply, fill #1

## 2024-03-20 MED ORDER — FUROSEMIDE 20 MG PO TABS
20.0000 mg | ORAL_TABLET | Freq: Every day | ORAL | 3 refills | Status: AC
  Filled 2024-03-20 – 2024-04-12 (×3): qty 90, 90d supply, fill #0
  Filled 2024-07-05: qty 90, 90d supply, fill #1

## 2024-03-20 NOTE — Progress Notes (Signed)
 Cardiology Office Note:    Date:  03/20/2024  ID:  Shannon Scott, DOB 1941-04-29, MRN [id number] PCP: Elliot Charm, MD  [facility] Providers Cardiologist:  Vinie JAYSON Maxcy, MD Cardiology APP:  Rana Lum CROME, NP       Patient Profile:       Chief Complaint: Hospital follow-up History of Present Illness:  Shannon Scott is a 83 y.o. female with visit-pertinent history of CAD s/p CABG x 3 with LIMA-LAD, SVG-OM, SVG-RCA in 2005 and s/p DES to SVG-OM 2 in 03/2023, severe aortic stenosis, locally advanced endometrial cancer s/p chemoradiation, immunotherapy, hysterectomy with BSO, colonic mass with partial colectomy and colostomy, prior DVT, PAF on eliquis , carotid artery stenosis, CVA, HTN, HLD, statin intolerance, lymphedema, asthma, hypothyroidism.    She s/p CABG x 3 in 2005.  Stress test in 2013 was negative for ischemia.  She was hospitalized in October 2021 in the setting of acute left MCA infarct.  Carotid artery ultrasound 05/2015 40-59% right ICA stenosis and 60-79% left ICA stenosis and greater than 50% left subclavian stenosis.   She was admitted 03/01/23 with NSTEMI. LHC performed and showed diffuse disease in the native vessels as well as both SVG grafts. Medical management was recommended and she was referred to the structural heart team for severe AS. She returned to the ED on 03/09/23 with inferior STEMI ultimately treated wtih PCI/DES-SVG-OM. Otherwise stable extensive disease including tandem lesions in the SVG-dRCA of 70% followed by 70%.  Echocardiogram showed EF 55 to 60%, RWMA, mild hypokinesis of the left ventricular, basal mid anterolateral wall, moderate mitral valve regurgitation, moderate aortic stenosis.  Unfortunately she developed radial PSA, this was medically managed by [facility]. PSA found to have spontaneously thrombosed, no intervention but with recommendations for DAPT with ASA/plavix  for a total of 2 weeks followed by Plavix  and Eliquis   thereafter.    Seen in clinic on 03/25/2023.  She noted after discharge from the hospital she had become short of breath so she self discontinued carvedilol  at home and her shortness of breath spontaneously resolved.  In clinic her diltiazem  was increased from 180 mg to 300 mg in response to carvedilol  being discontinued.  She was doing well overall with no further acute complaints and referred to lipid clinic for an LDL of 111 on Repatha  and Zetia  however she did not follow-up.   Seen in clinic on 08/05/2023.  This is a routine follow-up and patient denied any new cardiovascular concerns.  She had been managing her lymphedema with pumps and gauze dressings.  She had been receiving iron  infusions which seem to have improved her energy levels.  She had recently had 2 teeth extracted which has helped her eat.  No medication changes were made she was referred to lipid clinic.   She was seen by Pharm.D. lipid clinic on 10/06/2023.  LDL was currently uncontrolled at 103.  Of note patient's baseline LDL was 358 in 2021.  The thought was unlikely that switching from Repatha  to Leqvio would have significant additional LDL lowering effect.  Therefore patient's ezetimibe  was transition to Nexlizet  180-10 mg daily.  She was continued on Repatha .  She was last seen in clinic on 02/29/2024.  She experienced chest pressure for 4 days, located in the center of the chest and sometimes associated with palpitations.  Episodes are often triggered by physical activity and subside lasting approximately 5 to 10 minutes at a time.  She was set up for cardiac catheterization but lab work showed  hemoglobin of 6.7 and she was sent to the emergency department for admission.  She was ultimately admitted from 02/29/2024 through 03/14/2024.  She presented with a low hemoglobin of 6.7.  FOBT positive for dark stools out of colostomy.  2 units of PRBCs were given.  On 10/3 EGD was unremarkable and colonoscopy showed all diffuse area of  severely erythematous mucosa in the distal transverse colon which was biopsied.  Per GI it appeared like diversion colitis.  She was started on mesalamine enema.  Elevated troponin likely secondary to demand ischemia.  Her echocardiogram was without any regional wall motion abnormalities with an LVEF of 55 to 60%.  Given GI bleeding cardiology did recommend he discontinue Plavix  indefinitely.  She developed sudden onset narrow complex tachycardia on 10/3 that broke with 5 mg IV metoprolol  x 1.  She was started on amiodarone  drip and subsequently switched to oral amiodarone .  Outpatient cardiac monitor was placed.  She was resumed on Eliquis  2.5 mg twice daily.  Discussed the use of AI scribe software for clinical note transcription with the patient, who gave verbal consent to proceed.  Today patient arrives to clinic with her son.  She is doing well without acute cardiovascular concerns today.  She reports that her chest pains and dyspnea have resolved.  She denies any symptoms of fast heart rates or concerns for recurrent atrial fibrillation.  Reports no longer experiencing any hematochezia or melena.  She stays relatively active walking around her home with her walker without exertional symptoms.  Reports her weight has been stable.  Blood pressure readings at home have been low with recordings in the low 100s.  Patient reports that she stopped taking her metoprolol  approximately 3 days ago due to low blood pressure numbers.  Overall she feels much improved and back to her baseline.  No orthopnea, PND.  Chronic LEE is unchanged.  No dizziness, lightheadedness, syncope or presyncope.   Review of systems:  Please see the history of present illness. All other systems are reviewed and otherwise negative.      Studies Reviewed:        Echocardiogram 03/01/2024 1. Left ventricular ejection fraction, by estimation, is 55 to 60%. Left  ventricular ejection fraction by PLAX is 55 %. The left ventricle has   normal function. The left ventricle has no regional wall motion  abnormalities. Left ventricular diastolic  function could not be evaluated.   2. Right ventricular systolic function is moderately reduced. The right  ventricular size is normal. There is moderately elevated pulmonary artery  systolic pressure. The estimated right ventricular systolic pressure is  49.0 mmHg.   3. Left atrial size was moderately dilated.   4. The mitral valve is degenerative. Mild to moderate mitral valve  regurgitation. Moderate to severe mitral annular calcification.   5. Tricuspid valve regurgitation is mild to moderate.   6. The aortic valve is calcified. Aortic valve regurgitation is mild.  Severe aortic valve stenosis. Aortic valve area, by VTI measures 0.75 cm.  Aortic valve mean gradient measures 30.0 mmHg. Aortic valve Vmax measures  3.71 m/s. Peak gradient 55.1 mmHg,   DI 0.28.   7. The inferior vena cava is normal in size with <50% respiratory  variability, suggesting right atrial pressure of 8 mmHg.   Echocardiogram 03/09/2023 1. Left ventricular ejection fraction, by estimation, is 55 to 60%. The  left ventricle has normal function. The left ventricle demonstrates  regional wall motion abnormalities (see scoring diagram/findings for  description). There  is mild hypokinesis of  the left ventricular, basal-mid anterolateral wall.   2. The mitral valve is degenerative. Moderate mitral valve regurgitation.  No evidence of mitral stenosis. Severe mitral annular calcification.   3. Tricuspid valve regurgitation is mild to moderate.   4. The aortic valve is calcified. There is moderate calcification of the  aortic valve. There is moderate thickening of the aortic valve. Aortic  valve regurgitation is trivial. Moderate aortic valve stenosis.   5. There is mildly elevated pulmonary artery systolic pressure.   6. The inferior vena cava is normal in size with greater than 50%  respiratory  variability, suggesting right atrial pressure of 3 mmHg.    Cardiac catheterization 03/09/2023 1.  Known severe native vessel coronary artery disease, native coronary arteries not selectively imaged this procedure 2.  Known patency of the LIMA to LAD, this graft not selectively imaged this procedure 3.  Severe diffuse SVG to RCA disease, medical therapy recommended 4.  Acute total occlusion of a severely diseased saphenous vein graft to OM, treated successfully with primary PCI using a 3.0 x 12 mm Synergy DES Diagnostic Dominance: Right  Intervention    Echocardiogram 03/03/2023 1. Left ventricular ejection fraction, by estimation, is 55 to 60%. The  left ventricle has normal function. The left ventricle has no regional  wall motion abnormalities. There is mild concentric left ventricular  hypertrophy. Left ventricular diastolic  parameters are consistent with Grade II diastolic dysfunction  (pseudonormalization).   2. Right ventricular systolic function is low normal. The right  ventricular size is normal. There is normal pulmonary artery systolic  pressure. The estimated right ventricular systolic pressure is 24.7 mmHg.   3. Left atrial size was mildly dilated.   4. Right atrial size was mildly dilated.   5. The mitral valve is degenerative. Mild to moderate mitral valve  regurgitation. No evidence of mitral stenosis. Moderate to severe mitral  annular calcification.   6. Tricuspid valve regurgitation is moderate.   7. The aortic valve is tricuspid. There is moderate calcification of the  aortic valve. Aortic valve regurgitation is trivial. Suspect severe  paradoxical low flow/low gradient aortic valve stenosis. Aortic valve  area, by VTI measures 0.79 cm. Aortic  valve mean gradient measures 24.0 mmHg.   8. The inferior vena cava is normal in size with greater than 50%  respiratory variability, suggesting right atrial pressure of 3 mmHg.   Risk Assessment/Calculations:     CHA2DS2-VASc Score = 8   This indicates a 10.8% annual risk of stroke. The patient's score is based upon: CHF History: 1 HTN History: 1 Diabetes History: 0 Stroke History: 2 Vascular Disease History: 1 Age Score: 2 Gender Score: 1             Physical Exam:   VS:  BP (!) 120/50   Pulse 69   Ht 5' 1 (1.549 m)   Wt 119 lb (54 kg)   SpO2 98%   BMI 22.48 kg/m    Wt Readings from Last 3 Encounters:  03/20/24 119 lb (54 kg)  03/14/24 111 lb 8.8 oz (50.6 kg)  02/29/24 132 lb 3.2 oz (60 kg)    GEN: Well nourished, well developed in no acute distress NECK: No JVD; No carotid bruits CARDIAC: RRR.  3/6 systolic murmur RESPIRATORY:  Clear to auscultation without rales, wheezing or rhonchi  ABDOMEN: Soft, non-tender, non-distended EXTREMITIES: Chronic mild lymphedema; No acute deformity      Assessment and Plan:  Coronary artery disease  S/p CABGx 3 in 2005 (LIMA-LAD, SVG-OM, SVG-RCA)  S/p inferior STEMI in 03/2023 w/ PCI/DES to SVG-OM2.  Patent LIMA-LAD and severe diffuse SVG-dRCA disease including tandem lesions of 70% followed by a 70%, where medical therapy was recommended. - Presented to clinic on 10/1 with chest pressure/dyspnea and ultimately found to have GI bleed with hemoglobin of 6.7.  Elevated troponin thought to be due to demand ischemia.  Her clopidogrel  was discontinued indefinitely at that time Echocardiogram 02/2024 with LVEF 55 to 60%, no RWMA - Today patient is doing well without chest pains.  Her symptoms resolved with improvement in her anemia.  She is able to be fairly active without exertional symptoms.  No symptoms to suggest active angina, no indication further ischemic evaluation at this time - Continue ezetimibe  10 mg daily, metoprolol  succinate 12.5 mg daily, and Repatha  140 mg daily   Hyperlipidemia with LDL goal < 55 Direct LDL 108 on 02/2024 History of statin intolerance.  Unable to tolerate Nexlizet  due to constipation Seen by Pharm.D. on  10/06/2023.  Noted unlikely that switching from Repatha  to Leqvio would not have significant additional LDL lowering effect - Continue Repatha  140 mg q14 days and ezetimibe  10mg  daily  - Can repeat fasting lipid panel at follow-up visit    Chronic HFpEF Echocardiogram 02/2024 with LVEF 55 to 60%, no RWMA, RV moderately reduced, mild to moderate TR and severe AS Net -10.8 L on admission 02/2024.  Developed some pulmonary edema on CXR in the setting of multiple blood transfusions likely from AS and diastolic dysfunction - Hx of chronic LE lymphedema otherwise pt appears euvolemic and well compensated - NYHA class II without dyspnea, orthopnea, PND - Continue Lasix  20mg  on MWF   Severe aortic stenosis Echocardiogram 02/2024 showed severe aortic stenosis with mean gradient of 30 mmHg She was evaluated on 03/2023 by structural heart team and felt not to be a good candidate for TAVR - For now we will just simply continue to monitor and treat medically - At this time she is not interested in potential TAVR workup and not likely a candidate due to co morbidities  - Continue Lasix  20mg  MWF   Pulmonary hypertension Echocardiogram 02/2024 showed RV function moderately reduced and mildly elevated PASP of 49 mmHg - Continue current medication regimen   Paroxysmal artrial fibrillation  SVT New SVT noted on 10/3 during admission, converted with metoprolol  and subsequently started on amiodarone  - Today she is maintaining NSR - She denies any symptoms for recurrent atrial fibrillation.  She denies any palpitations or episodes of tachycardia - Continue metoprolol  succinate 12.5 mg daily - Continue Eliquis  2.5mg  BID - Continue amiodarone  100 mg daily - 14-day ZIO is currently pending  Hypertension Blood pressure today is 120/50 Per patient she has held her metoprolol  over the past 3 days given low blood pressure readings at home - Will plan to decrease metoprolol  to 12.5 mg daily   Endometrial  Cancer S/p chemoradiation, immunotherapy S/p hysterectomy with BSO - Management per oncology  Colonic mass  History of sigmoid colon obstruction - S/p colectomy and transverse loop colostomy      Dispo:  Return in about 3 months (around 06/20/2024).  Signed, Lum LITTIE Louis, NP

## 2024-03-20 NOTE — Patient Instructions (Addendum)
 Medication Instructions:  DECREASE YOUR METOPROLOL  SUCCINATE TO 12.5 (1/2 TABLET) DAILY.  Lab Work: Nutritional therapist AND CBC TO BE DONE TODAY.  Testing/Procedures: NONE  Follow-Up: At [facility], you and your health needs are our priority.  As part of our continuing mission to provide you with exceptional heart care, our providers are all part of one team.  This team includes your primary Cardiologist (physician) and Advanced Practice Providers or APPs (Physician Assistants and Nurse Practitioners) who all work together to provide you with the care you need, when you need it.  Your next appointment:   3 MONTHS  Provider:   Vinie JAYSON Maxcy, MD

## 2024-03-21 ENCOUNTER — Ambulatory Visit: Payer: Self-pay | Admitting: Emergency Medicine

## 2024-03-21 LAB — BASIC METABOLIC PANEL WITH GFR
BUN/Creatinine Ratio: 27 (ref 12–28)
BUN: 22 mg/dL (ref 8–27)
CO2: 27 mmol/L (ref 20–29)
Calcium: 9.1 mg/dL (ref 8.7–10.3)
Chloride: 95 mmol/L — AB (ref 96–106)
Creatinine, Ser: 0.83 mg/dL (ref 0.57–1.00)
Glucose: 92 mg/dL (ref 70–99)
Potassium: 4.2 mmol/L (ref 3.5–5.2)
Sodium: 136 mmol/L (ref 134–144)
eGFR: 70 mL/min/1.73 (ref >59)

## 2024-03-21 LAB — CBC
Hematocrit: 34.6 % (ref 34.0–46.6)
Hemoglobin: 10.6 g/dL — ABNORMAL LOW (ref 11.1–15.9)
MCH: 27.3 pg (ref 26.6–33.0)
MCHC: 30.6 g/dL — ABNORMAL LOW (ref 31.5–35.7)
MCV: 89 fL (ref 79–97)
Platelets: 442 x10E3/uL (ref 150–450)
RBC: 3.88 x10E6/uL (ref 3.77–5.28)
RDW: 15.7 % — ABNORMAL HIGH (ref 11.7–15.4)
WBC: 4.6 x10E3/uL (ref 3.4–10.8)

## 2024-03-22 ENCOUNTER — Telehealth: Payer: Self-pay | Admitting: Hematology & Oncology

## 2024-03-22 NOTE — Telephone Encounter (Signed)
 Called to schedule hospital follow up. LVM to return call for scheduling.

## 2024-04-02 ENCOUNTER — Other Ambulatory Visit (HOSPITAL_COMMUNITY): Payer: Self-pay

## 2024-04-02 ENCOUNTER — Other Ambulatory Visit: Payer: Self-pay | Admitting: Internal Medicine

## 2024-04-02 DIAGNOSIS — Z8673 Personal history of transient ischemic attack (TIA), and cerebral infarction without residual deficits: Secondary | ICD-10-CM

## 2024-04-02 DIAGNOSIS — E785 Hyperlipidemia, unspecified: Secondary | ICD-10-CM

## 2024-04-02 DIAGNOSIS — I482 Chronic atrial fibrillation, unspecified: Secondary | ICD-10-CM | POA: Diagnosis not present

## 2024-04-02 DIAGNOSIS — I471 Supraventricular tachycardia, unspecified: Secondary | ICD-10-CM

## 2024-04-02 DIAGNOSIS — I25709 Atherosclerosis of coronary artery bypass graft(s), unspecified, with unspecified angina pectoris: Secondary | ICD-10-CM

## 2024-04-02 NOTE — Addendum Note (Signed)
 Encounter addended by: Malvina Pina A on: 04/02/2024 9:16 AM  Actions taken: Imaging Exam ended

## 2024-04-03 ENCOUNTER — Other Ambulatory Visit (HOSPITAL_COMMUNITY): Payer: Self-pay

## 2024-04-03 ENCOUNTER — Ambulatory Visit: Payer: Self-pay | Admitting: Student

## 2024-04-04 ENCOUNTER — Other Ambulatory Visit: Payer: Self-pay | Admitting: Emergency Medicine

## 2024-04-04 ENCOUNTER — Other Ambulatory Visit (HOSPITAL_COMMUNITY): Payer: Self-pay

## 2024-04-04 DIAGNOSIS — D62 Acute posthemorrhagic anemia: Secondary | ICD-10-CM

## 2024-04-04 DIAGNOSIS — Z8673 Personal history of transient ischemic attack (TIA), and cerebral infarction without residual deficits: Secondary | ICD-10-CM

## 2024-04-04 DIAGNOSIS — E785 Hyperlipidemia, unspecified: Secondary | ICD-10-CM

## 2024-04-04 DIAGNOSIS — I25709 Atherosclerosis of coronary artery bypass graft(s), unspecified, with unspecified angina pectoris: Secondary | ICD-10-CM

## 2024-04-04 MED ORDER — FOLIC ACID 1 MG PO TABS
2.0000 mg | ORAL_TABLET | Freq: Every day | ORAL | 3 refills | Status: AC
  Filled 2024-04-04 – 2024-04-12 (×2): qty 180, 90d supply, fill #0
  Filled 2024-07-04: qty 180, 90d supply, fill #1

## 2024-04-04 MED ORDER — APIXABAN 2.5 MG PO TABS
2.5000 mg | ORAL_TABLET | Freq: Two times a day (BID) | ORAL | 5 refills | Status: AC
  Filled 2024-04-04 – 2024-04-12 (×3): qty 60, 30d supply, fill #0
  Filled 2024-05-06: qty 60, 30d supply, fill #1
  Filled 2024-06-05: qty 60, 30d supply, fill #2
  Filled 2024-07-04: qty 60, 30d supply, fill #3

## 2024-04-04 NOTE — Telephone Encounter (Signed)
 Prescription refill request for Eliquis  received. Indication:  A-Fib Last office visit:  03/20/2024 Scr:   1.02  last labs on 03/20/2024 Age: 83 yrs. Weight:  15 kg  Pt was on Eliquis  5 mg and medication was adjusted in the hospital, not pt takes Eliquis  2.5 mg. Pt meets protocol and pt's medication was sent to pt's pharmacy for 6 mos.

## 2024-04-04 NOTE — Telephone Encounter (Signed)
*  STAT* If patient is at the pharmacy, call can be transferred to refill team.   1. Which medications need to be refilled? (please list name of each medication and dose if known)   folic acid  (FOLVITE ) 1 MG tablet    Evolocumab  (REPATHA  SURECLICK) 140 MG/ML SOAJ  apixaban  (ELIQUIS ) 2.5 MG TABS tablet    2. Would you like to learn more about the convenience, safety, & potential cost savings by using the [facility]? No    3. Are you open to using the [facility] (Type [facility]. No    4. Which pharmacy/location (including street and city if local pharmacy) is medication to be sent to? [place] [facility]    5. Do they need a 30 day or 90 day supply? 90 day    Pt wants rx mailed.  Pt is out of medications.

## 2024-04-06 ENCOUNTER — Inpatient Hospital Stay: Attending: Hematology & Oncology

## 2024-04-06 ENCOUNTER — Inpatient Hospital Stay (HOSPITAL_BASED_OUTPATIENT_CLINIC_OR_DEPARTMENT_OTHER): Admitting: Family

## 2024-04-06 ENCOUNTER — Other Ambulatory Visit (HOSPITAL_COMMUNITY): Payer: Self-pay

## 2024-04-06 ENCOUNTER — Inpatient Hospital Stay

## 2024-04-06 VITALS — BP 140/65 | HR 61 | Resp 16

## 2024-04-06 DIAGNOSIS — I4891 Unspecified atrial fibrillation: Secondary | ICD-10-CM | POA: Diagnosis not present

## 2024-04-06 DIAGNOSIS — Z923 Personal history of irradiation: Secondary | ICD-10-CM | POA: Insufficient documentation

## 2024-04-06 DIAGNOSIS — D508 Other iron deficiency anemias: Secondary | ICD-10-CM

## 2024-04-06 DIAGNOSIS — C539 Malignant neoplasm of cervix uteri, unspecified: Secondary | ICD-10-CM

## 2024-04-06 DIAGNOSIS — D509 Iron deficiency anemia, unspecified: Secondary | ICD-10-CM | POA: Insufficient documentation

## 2024-04-06 DIAGNOSIS — Z9221 Personal history of antineoplastic chemotherapy: Secondary | ICD-10-CM | POA: Insufficient documentation

## 2024-04-06 DIAGNOSIS — Z15060 Genetic susceptibility to colorectal cancer: Secondary | ICD-10-CM | POA: Diagnosis not present

## 2024-04-06 DIAGNOSIS — Z79899 Other long term (current) drug therapy: Secondary | ICD-10-CM | POA: Diagnosis not present

## 2024-04-06 DIAGNOSIS — C541 Malignant neoplasm of endometrium: Secondary | ICD-10-CM

## 2024-04-06 DIAGNOSIS — Z8542 Personal history of malignant neoplasm of other parts of uterus: Secondary | ICD-10-CM | POA: Diagnosis not present

## 2024-04-06 DIAGNOSIS — I890 Lymphedema, not elsewhere classified: Secondary | ICD-10-CM | POA: Insufficient documentation

## 2024-04-06 DIAGNOSIS — D510 Vitamin B12 deficiency anemia due to intrinsic factor deficiency: Secondary | ICD-10-CM

## 2024-04-06 DIAGNOSIS — Z933 Colostomy status: Secondary | ICD-10-CM | POA: Diagnosis not present

## 2024-04-06 DIAGNOSIS — L03116 Cellulitis of left lower limb: Secondary | ICD-10-CM | POA: Insufficient documentation

## 2024-04-06 DIAGNOSIS — C799 Secondary malignant neoplasm of unspecified site: Secondary | ICD-10-CM

## 2024-04-06 LAB — CBC WITH DIFFERENTIAL (CANCER CENTER ONLY)
Abs Immature Granulocytes: 0.03 K/uL (ref 0.00–0.07)
Basophils Absolute: 0 K/uL (ref 0.0–0.1)
Basophils Relative: 1 %
Eosinophils Absolute: 0 K/uL (ref 0.0–0.5)
Eosinophils Relative: 1 %
HCT: 32.3 % — ABNORMAL LOW (ref 36.0–46.0)
Hemoglobin: 9.9 g/dL — ABNORMAL LOW (ref 12.0–15.0)
Immature Granulocytes: 1 %
Lymphocytes Relative: 17 %
Lymphs Abs: 0.7 K/uL (ref 0.7–4.0)
MCH: 27.2 pg (ref 26.0–34.0)
MCHC: 30.7 g/dL (ref 30.0–36.0)
MCV: 88.7 fL (ref 80.0–100.0)
Monocytes Absolute: 0.3 K/uL (ref 0.1–1.0)
Monocytes Relative: 8 %
Neutro Abs: 2.9 K/uL (ref 1.7–7.7)
Neutrophils Relative %: 72 %
Platelet Count: 202 K/uL (ref 150–400)
RBC: 3.64 MIL/uL — ABNORMAL LOW (ref 3.87–5.11)
RDW: 17.1 % — ABNORMAL HIGH (ref 11.5–15.5)
WBC Count: 4 K/uL (ref 4.0–10.5)
nRBC: 0 % (ref 0.0–0.2)

## 2024-04-06 LAB — RETICULOCYTES
Immature Retic Fract: 11.5 % (ref 2.3–15.9)
RBC.: 3.62 MIL/uL — ABNORMAL LOW (ref 3.87–5.11)
Retic Count, Absolute: 39.1 K/uL (ref 19.0–186.0)
Retic Ct Pct: 1.1 % (ref 0.4–3.1)

## 2024-04-06 LAB — IRON AND IRON BINDING CAPACITY (CC-WL,HP ONLY)
Iron: 28 ug/dL (ref 28–170)
Saturation Ratios: 9 % — ABNORMAL LOW (ref 10.4–31.8)
TIBC: 307 ug/dL (ref 250–450)
UIBC: 279 ug/dL

## 2024-04-06 LAB — SAMPLE TO BLOOD BANK

## 2024-04-06 LAB — CMP (CANCER CENTER ONLY)
ALT: 13 U/L (ref 0–44)
AST: 20 U/L (ref 15–41)
Albumin: 3.8 g/dL (ref 3.5–5.0)
Alkaline Phosphatase: 74 U/L (ref 38–126)
Anion gap: 8 (ref 5–15)
BUN: 20 mg/dL (ref 8–23)
CO2: 26 mmol/L (ref 22–32)
Calcium: 9.3 mg/dL (ref 8.9–10.3)
Chloride: 104 mmol/L (ref 98–111)
Creatinine: 0.8 mg/dL (ref 0.44–1.00)
GFR, Estimated: >60 mL/min (ref >60)
Glucose, Bld: 124 mg/dL — ABNORMAL HIGH (ref 70–99)
Potassium: 4.3 mmol/L (ref 3.5–5.1)
Sodium: 138 mmol/L (ref 135–145)
Total Bilirubin: 0.2 mg/dL (ref 0.0–1.2)
Total Protein: 6.3 g/dL — ABNORMAL LOW (ref 6.5–8.1)

## 2024-04-06 LAB — FERRITIN: Ferritin: 43 ng/mL (ref 11–307)

## 2024-04-06 LAB — VITAMIN B12: Vitamin B-12: 1324 pg/mL — ABNORMAL HIGH (ref 180–914)

## 2024-04-06 MED ORDER — DARBEPOETIN ALFA 300 MCG/0.6ML IJ SOSY
300.0000 ug | PREFILLED_SYRINGE | Freq: Once | INTRAMUSCULAR | Status: AC
  Administered 2024-04-06: 300 ug via SUBCUTANEOUS
  Filled 2024-04-06: qty 0.6

## 2024-04-06 MED ORDER — DARBEPOETIN ALFA 300 MCG/0.6ML IJ SOSY: 300.0000 ug | PREFILLED_SYRINGE | Freq: Once | INTRAMUSCULAR | Status: DC

## 2024-04-06 MED ORDER — REPATHA SURECLICK 140 MG/ML ~~LOC~~ SOAJ
140.0000 mg | SUBCUTANEOUS | 3 refills | Status: AC
  Filled 2024-04-06 – 2024-04-12 (×2): qty 6, 84d supply, fill #0
  Filled 2024-06-28: qty 6, 84d supply, fill #1

## 2024-04-06 NOTE — Progress Notes (Signed)
 Hematology and Oncology Follow Up Visit  Shannon Scott [id number] 06/08/1940 83 y.o. 04/09/2024   Principle Diagnosis:  Locally advanced/recurrent endometrial carcinoma undifferentiated --  TMB (HIGH) / MSI HIGH  Intermittent iron  deficiency anemia Erythropoietin  deficient anemia   Past Therapy: Radiation therapy for 5 -5 1/2 weeks - s/p 20 fractions Taxol /carboplatinum q 7 days -- s/p cycle 6  Aranesp  300 mcg subcu every 3 weeks for hemoglobin less than 11   Interim History:  Shannon Scott is here today for follow-up. She is doing fairly well. She is in a wheelchair today. She was hospitalized in October with GI bleed and associated anemia. She was also found to be in a fib also. She received blood as well as an ESA.  EGD was unremarkable and colonoscopy showed diffuse colitis. She received mesalamine enemas through her colostomy.   She has not noted any new blood loss since discharge. Ostomy is working well.  No bruising or petechiae.  No fever, chills, n/v, cough, rash, dizziness, SOB, chest pain, abdominal pain or changes in bladder habits.  Lymphedema and cellulitis in the left leg appears to be improved.  Pedal pulses are 1+.  No falls or syncope reported.  Appetite and hydration are good per patient.  ECOG Performance Status: 1 - Symptomatic but completely ambulatory  Medications:  Allergies as of 04/06/2024       Reactions   Statins Other (See Comments)   Myalgias and memory problems   Ciprofloxacin  Itching, Swelling, Other (See Comments)   Other reaction(s): swelling/redness at injection site        Medication List        Accurate as of April 06, 2024 11:59 PM. If you have any questions, ask your nurse or doctor.          amiodarone  200 MG tablet Commonly known as: PACERONE  Take 0.5 tablets (100 mg total) by mouth daily.   apixaban  2.5 MG Tabs tablet Commonly known as: ELIQUIS  Take 1 tablet (2.5 mg total) by mouth 2 (two) times daily.    ezetimibe  10 MG tablet Commonly known as: ZETIA  Take 1 tablet (10 mg total) by mouth daily.   folic acid  1 MG tablet Commonly known as: FOLVITE  Take 2 tablets (2 mg total) by mouth daily.   furosemide  20 MG tablet Commonly known as: LASIX  Take 1 tablet (20 mg total) by mouth daily.   levothyroxine  50 MCG tablet Commonly known as: SYNTHROID  Take 1 tablet (50 mcg total) by mouth daily.   mesalamine 4 g enema Commonly known as: ROWASA Place 60 mLs (4 g total) rectally every 3 (three) days.   metoprolol  succinate 25 MG 24 hr tablet Commonly known as: TOPROL -XL Take 1/2 tablet (12.5 mg total) by mouth daily. Take with or immediately following a meal.   nitroGLYCERIN  0.4 MG SL tablet Commonly known as: NITROSTAT  Place 1 tablet (0.4 mg total) under the tongue every 5 (five) minutes as needed for chest pain.   One A Day Women 50 Plus Chew Chew 1 each by mouth daily at 6 PM. One-A-Day Women's 50+ Gummies;  Chew one gummy by mouth every evening at 1800.   pantoprazole  40 MG tablet Commonly known as: PROTONIX  Take 1 tablet (40 mg total) by mouth daily.   polyethylene glycol 17 g packet Commonly known as: MIRALAX  / GLYCOLAX  Take 17 g by mouth daily as needed for severe constipation. What changed: when to take this   Repatha  SureClick 140 MG/ML Soaj Generic drug: Evolocumab  Inject 140 mg  into the skin every 14 (fourteen) days.   traMADol  50 MG tablet Commonly known as: ULTRAM  TAKE 2 TABLETS(100 MG) BY MOUTH TWICE DAILY   Vitamin B-12 5000 MCG Lozg Take 1 each by mouth in the morning.        Allergies:  Allergies  Allergen Reactions   Statins Other (See Comments)    Myalgias and memory problems   Ciprofloxacin  Itching, Swelling and Other (See Comments)    Other reaction(s): swelling/redness at injection site    Past Medical History, Surgical history, Social history, and Family History were reviewed and updated.  Review of Systems: All other 10 point review of  systems is negative.   Physical Exam:  blood pressure is 140/65 (abnormal) and her pulse is 61. Her respiration is 16 and oxygen saturation is 97%.   Wt Readings from Last 3 Encounters:  03/20/24 119 lb (54 kg)  03/14/24 111 lb 8.8 oz (50.6 kg)  02/29/24 132 lb 3.2 oz (60 kg)    Ocular: Sclerae unicteric, pupils equal, round and reactive to light Ear-nose-throat: Oropharynx clear, dentition fair Lymphatic: No cervical or supraclavicular adenopathy Lungs no rales or rhonchi, good excursion bilaterally Heart regular rate and rhythm, no murmur appreciated Abd soft, nontender, positive bowel sounds, ostomy intact, no blood noted in output MSK no focal spinal tenderness, no joint edema Neuro: non-focal, well-oriented, appropriate affect Breasts: Deferred   Lab Results  Component Value Date   WBC 4.0 04/06/2024   HGB 9.9 (L) 04/06/2024   HCT 32.3 (L) 04/06/2024   MCV 88.7 04/06/2024   PLT 202 04/06/2024   Lab Results  Component Value Date   FERRITIN 43 04/06/2024   IRON  28 04/06/2024   TIBC 307 04/06/2024   UIBC 279 04/06/2024   IRONPCTSAT 9 (L) 04/06/2024   Lab Results  Component Value Date   RETICCTPCT 1.1 04/06/2024   RBC 3.62 (L) 04/06/2024   RBC 3.64 (L) 04/06/2024   No results found for: KPAFRELGTCHN, LAMBDASER, KAPLAMBRATIO No results found for: IGGSERUM, IGA, IGMSERUM No results found for: STEPHANY CARLOTA BENSON MARKEL EARLA JOANNIE DOC VICK, SPEI   Chemistry      Component Value Date/Time   NA 138 04/06/2024 1500   NA 136 03/20/2024 1603   K 4.3 04/06/2024 1500   CL 104 04/06/2024 1500   CO2 26 04/06/2024 1500   BUN 20 04/06/2024 1500   BUN 22 03/20/2024 1603   CREATININE 0.80 04/06/2024 1500   CREATININE 0.69 08/02/2016 1519   GLU 111 09/08/2017 0000      Component Value Date/Time   CALCIUM 9.3 04/06/2024 1500   ALKPHOS 74 04/06/2024 1500   AST 20 04/06/2024 1500   ALT 13 04/06/2024 1500   BILITOT 0.2  04/06/2024 1500       Impression and Plan: Shannon Scott is a very pleasant 83 yo caucasian female with recurrent undifferentiated endometrial carcinoma. She has Lynch syndrome.   She was on immunotherapy. She tolerated this nicely and in worked well. This has been on hold per pt request.  ESA injection given for Hgb 9.9.  We will plan to see her again in 5 weeks.  She has not had a CT scan since we last saw her. We will look at scheduling this after her next follow-up.   Lauraine Pepper, NP 11/10/202510:24 AM

## 2024-04-06 NOTE — Patient Instructions (Addendum)
 [facility] Guide An implanted port is a device that is placed under the skin. It is usually placed in the chest. The device may vary based on the need. Implanted ports can be used to give IV medicine, to take blood, or to give fluids. You may have an implanted port if: You need IV medicine that would be irritating to the small veins in your hands or arms. You need IV medicines, such as chemotherapy, for a long period of time. You need IV nutrition for a long period of time. You may have fewer limitations when using a port than you would if you used other types of long-term IVs. You will also likely be able to return to normal activities after your incision heals. An implanted port has two main parts: Reservoir. The reservoir is the part where a needle is inserted to give medicines or draw blood. The reservoir is round. After the port is placed, it appears as a small, raised area under your skin. Catheter. The catheter is a small, thin tube that connects the reservoir to a vein. Medicine that is inserted into the reservoir goes into the catheter and then into the vein. How is my port accessed? To access your port: A numbing cream may be placed on the skin over the port site. Your health care provider will put on a mask and sterile gloves. The skin over your port will be cleaned carefully with a germ-killing soap and allowed to dry. Your health care provider will gently pinch the port and insert a needle into it. Your health care provider will check for a blood return to make sure the port is in the vein and is still working (patent). If your port needs to remain accessed to get medicine continuously (constant infusion), your health care provider will place a clear bandage (dressing) over the needle site. The dressing and needle will need to be changed every week, or as told by your health care provider. What is flushing? Flushing helps keep the port working. Follow instructions from your  health care provider about how and when to flush the port. Ports are usually flushed with saline solution or a medicine called heparin . The need for flushing will depend on how the port is used: If the port is only used from time to time to give medicines or draw blood, the port may need to be flushed: Before and after medicines have been given. Before and after blood has been drawn. As part of routine maintenance. Flushing may be recommended every 4-6 weeks. If a constant infusion is running, the port may not need to be flushed. Throw away any syringes in a disposal container that is meant for sharp items (sharps container). You can buy a sharps container from a pharmacy, or you can make one by using an empty hard plastic bottle with a cover. How long will my port stay implanted? The port can stay in for as long as your health care provider thinks it is needed. When it is time for the port to come out, a surgery will be done to remove it. The surgery will be similar to the procedure that was done to put the port in. Follow these instructions at home: Caring for your port and port site Flush your port as told by your health care provider. If you need an infusion over several days, follow instructions from your health care provider about how to take care of your port site. Make sure you: Change your  dressing as told by your health care provider. Wash your hands with soap and water for at least 20 seconds before and after you change your dressing. If soap and water are not available, use alcohol-based hand sanitizer. Place any used dressings or infusion bags into a plastic bag. Throw that bag in the trash. Keep the dressing that covers the needle clean and dry. Do not get it wet. Do not use scissors or sharp objects near the infusion tubing. Keep any external tubes clamped, unless they are being used. Check your port site every day for signs of infection. Check for: Redness, swelling, or  pain. Fluid or blood. Warmth. Pus or a bad smell. Protect the skin around the port site. Avoid wearing bra straps that rub or irritate the site. Protect the skin around your port from seat belts. Place a soft pad over your chest if needed. Bathe or shower as told by your health care provider. The site may get wet as long as you are not actively receiving an infusion. General instructions  Return to your normal activities as told by your health care provider. Ask your health care provider what activities are safe for you. Carry a medical alert card or wear a medical alert bracelet at all times. This will let health care providers know that you have an implanted port in case of an emergency. Where to find more information American Cancer Society: www.cancer.org American Society of Clinical Oncology: www.cancer.net Contact a health care provider if: You have a fever or chills. You have redness, swelling, or pain at the port site. You have fluid or blood coming from your port site. Your incision feels warm to the touch. You have pus or a bad smell coming from the port site. Summary Implanted ports are usually placed in the chest for long-term IV access. Follow instructions from your health care provider about flushing the port and changing bandages (dressings). Take care of the area around your port by avoiding clothing that puts pressure on the area, and by watching for signs of infection. Protect the skin around your port from seat belts. Place a soft pad over your chest if needed. Contact a health care provider if you have a fever or you have redness, swelling, pain, fluid, or a bad smell at the port site. This information is not intended to replace advice given to you by your health care provider. Make sure you discuss any questions you have with your health care provider. Document Revised: 11/18/2020 Document Reviewed: 11/18/2020 Elsevier Patient Education  2024 Elsevier  Inc.Darbepoetin Alfa  Injection What is this medication? DARBEPOETIN ALFA  (dar be POE e tin AL fa) treats low levels of red blood cells (anemia) caused by kidney disease or chemotherapy. It works by Systems analyst make more red blood cells, which reduces the need for blood transfusions. This medicine may be used for other purposes; ask your health care provider or pharmacist if you have questions. COMMON BRAND NAME(S): Aranesp  What should I tell my care team before I take this medication? They need to know if you have any of these conditions: Blood clots Cancer Heart disease High blood pressure On dialysis Seizures Stroke An unusual or allergic reaction to darbepoetin, latex, other medications, foods, dyes, or preservatives Pregnant or trying to get pregnant Breast-feeding How should I use this medication? This medication is injected into a vein or under the skin. It is usually given by a care team in a hospital or clinic setting. It may also be  given at home. If you get this medication at home, you will be taught how to prepare and give it. Use exactly as directed. Take it as directed on the prescription label at the same time every day. Keep taking it unless your care team tells you to stop. It is important that you put your used needles and syringes in a special sharps container. Do not put them in a trash can. If you do not have a sharps container, call your pharmacist or care team to get one. A special MedGuide will be given to you by the pharmacist with each prescription and refill. Be sure to read this information carefully each time. Talk to your care team about the use of this medication in children. While this medication may be used in children as young as 1 month of age for selected conditions, precautions do apply. Overdosage: If you think you have taken too much of this medicine contact a poison control center or emergency room at once. NOTE: This medicine is only for you. Do  not share this medicine with others. What if I miss a dose? If you miss a dose, take it as soon as you can. If it is almost time for your next dose, take only that dose. Do not take double or extra doses. What may interact with this medication? Epoetin alfa Methoxy polyethylene glycol-epoetin beta This list may not describe all possible interactions. Give your health care provider a list of all the medicines, herbs, non-prescription drugs, or dietary supplements you use. Also tell them if you smoke, drink alcohol, or use illegal drugs. Some items may interact with your medicine. What should I watch for while using this medication? Visit your care team for regular checks on your progress. Check your blood pressure as directed. Know what your blood pressure should be and when to contact your care team. Your condition will be monitored carefully while you are receiving this medication. You may need blood work while taking this medication. What side effects may I notice from receiving this medication? Side effects that you should report to your care team as soon as possible: Allergic reactions--skin rash, itching, hives, swelling of the face, lips, tongue, or throat Blood clot--pain, swelling, or warmth in the leg, shortness of breath, chest pain Heart attack--pain or tightness in the chest, shoulders, arms, or jaw, nausea, shortness of breath, cold or clammy skin, feeling faint or lightheaded Increase in blood pressure Rash, fever, and swollen lymph nodes Redness, blistering, peeling, or loosening of the skin, including inside the mouth Seizures Stroke--sudden numbness or weakness of the face, arm, or leg, trouble speaking, confusion, trouble walking, loss of balance or coordination, dizziness, severe headache, change in vision Side effects that usually do not require medical attention (report to your care team if they continue or are bothersome): Cough Stomach pain Swelling of the ankles, hands,  or feet This list may not describe all possible side effects. Call your doctor for medical advice about side effects. You may report side effects to FDA at 1-800-FDA-1088. Where should I keep my medication? Keep out of the reach of children and pets. Store in a refrigerator. Do not freeze. Do not shake. Protect from light. Keep this medication in the original container until you are ready to take it. See product for storage information. Get rid of any unused medication after the expiration date. To get rid of medications that are no longer needed or have expired: Take the medication to a medication take-back program.  Check with your pharmacy or law enforcement to find a location. If you cannot return the medication, ask your pharmacist or care team how to get rid of the medication safely. NOTE: This sheet is a summary. It may not cover all possible information. If you have questions about this medicine, talk to your doctor, pharmacist, or health care provider.  2024 Elsevier/Gold Standard (2021-09-16 00:00:00)

## 2024-04-07 ENCOUNTER — Ambulatory Visit: Payer: Self-pay | Admitting: Hematology & Oncology

## 2024-04-08 ENCOUNTER — Other Ambulatory Visit (HOSPITAL_COMMUNITY): Payer: Self-pay

## 2024-04-09 ENCOUNTER — Encounter: Payer: Self-pay | Admitting: *Deleted

## 2024-04-09 ENCOUNTER — Encounter: Payer: Self-pay | Admitting: Internal Medicine

## 2024-04-09 ENCOUNTER — Other Ambulatory Visit: Payer: Self-pay | Admitting: Hematology & Oncology

## 2024-04-09 ENCOUNTER — Encounter: Payer: Self-pay | Admitting: Family

## 2024-04-09 DIAGNOSIS — M545 Low back pain, unspecified: Secondary | ICD-10-CM

## 2024-04-09 DIAGNOSIS — C541 Malignant neoplasm of endometrium: Secondary | ICD-10-CM

## 2024-04-09 DIAGNOSIS — G8929 Other chronic pain: Secondary | ICD-10-CM

## 2024-04-09 DIAGNOSIS — M792 Neuralgia and neuritis, unspecified: Secondary | ICD-10-CM

## 2024-04-12 ENCOUNTER — Other Ambulatory Visit (HOSPITAL_COMMUNITY): Payer: Self-pay

## 2024-04-12 ENCOUNTER — Other Ambulatory Visit: Payer: Self-pay | Admitting: Emergency Medicine

## 2024-04-12 ENCOUNTER — Other Ambulatory Visit: Payer: Self-pay

## 2024-04-13 ENCOUNTER — Other Ambulatory Visit: Payer: Self-pay

## 2024-04-13 ENCOUNTER — Other Ambulatory Visit (HOSPITAL_BASED_OUTPATIENT_CLINIC_OR_DEPARTMENT_OTHER): Payer: Self-pay

## 2024-04-14 ENCOUNTER — Other Ambulatory Visit (HOSPITAL_COMMUNITY): Payer: Self-pay

## 2024-04-16 ENCOUNTER — Other Ambulatory Visit (HOSPITAL_COMMUNITY): Payer: Self-pay

## 2024-04-19 ENCOUNTER — Inpatient Hospital Stay

## 2024-04-19 VITALS — BP 165/57 | HR 67 | Temp 98.1°F | Resp 18

## 2024-04-19 DIAGNOSIS — D509 Iron deficiency anemia, unspecified: Secondary | ICD-10-CM | POA: Diagnosis not present

## 2024-04-19 DIAGNOSIS — D508 Other iron deficiency anemias: Secondary | ICD-10-CM

## 2024-04-19 MED ORDER — SODIUM CHLORIDE 0.9 % IV SOLN: INTRAVENOUS | Status: DC

## 2024-04-19 MED ORDER — IRON SUCROSE 300 MG IVPB - SIMPLE MED
300.0000 mg | Freq: Once | Status: AC
  Administered 2024-04-19: 300 mg via INTRAVENOUS
  Filled 2024-04-19: qty 300

## 2024-04-19 NOTE — Patient Instructions (Signed)

## 2024-04-24 ENCOUNTER — Other Ambulatory Visit (HOSPITAL_COMMUNITY): Payer: Self-pay

## 2024-04-24 ENCOUNTER — Other Ambulatory Visit: Payer: Self-pay

## 2024-04-24 MED ORDER — PANTOPRAZOLE SODIUM 40 MG PO TBEC
40.0000 mg | DELAYED_RELEASE_TABLET | Freq: Every day | ORAL | 0 refills | Status: AC
  Filled 2024-04-24: qty 30, 30d supply, fill #0

## 2024-04-27 ENCOUNTER — Other Ambulatory Visit (HOSPITAL_COMMUNITY): Payer: Self-pay

## 2024-05-03 ENCOUNTER — Inpatient Hospital Stay: Attending: Hematology & Oncology

## 2024-05-03 VITALS — BP 97/67 | HR 62 | Temp 98.7°F | Resp 17

## 2024-05-03 DIAGNOSIS — Z1509 Genetic susceptibility to other malignant neoplasm: Secondary | ICD-10-CM | POA: Diagnosis not present

## 2024-05-03 DIAGNOSIS — Z8542 Personal history of malignant neoplasm of other parts of uterus: Secondary | ICD-10-CM | POA: Insufficient documentation

## 2024-05-03 DIAGNOSIS — D508 Other iron deficiency anemias: Secondary | ICD-10-CM

## 2024-05-03 DIAGNOSIS — Z79899 Other long term (current) drug therapy: Secondary | ICD-10-CM | POA: Insufficient documentation

## 2024-05-03 DIAGNOSIS — D509 Iron deficiency anemia, unspecified: Secondary | ICD-10-CM | POA: Insufficient documentation

## 2024-05-03 MED ORDER — IRON SUCROSE 300 MG IVPB - SIMPLE MED
300.0000 mg | Freq: Once | Status: AC
  Administered 2024-05-03: 300 mg via INTRAVENOUS
  Filled 2024-05-03: qty 300

## 2024-05-03 MED ORDER — SODIUM CHLORIDE 0.9 % IV SOLN: INTRAVENOUS | Status: DC

## 2024-05-03 NOTE — Patient Instructions (Signed)

## 2024-05-10 ENCOUNTER — Other Ambulatory Visit: Payer: Self-pay | Admitting: Medical Oncology

## 2024-05-10 ENCOUNTER — Inpatient Hospital Stay

## 2024-05-10 VITALS — BP 149/61 | HR 62 | Temp 98.6°F | Resp 19

## 2024-05-10 DIAGNOSIS — D508 Other iron deficiency anemias: Secondary | ICD-10-CM

## 2024-05-10 DIAGNOSIS — D509 Iron deficiency anemia, unspecified: Secondary | ICD-10-CM | POA: Diagnosis not present

## 2024-05-10 MED ORDER — IRON SUCROSE 300 MG IVPB - SIMPLE MED
300.0000 mg | Freq: Once | Status: AC
  Administered 2024-05-10: 300 mg via INTRAVENOUS
  Filled 2024-05-10: qty 300

## 2024-05-10 MED ORDER — SODIUM CHLORIDE 0.9 % IV SOLN: Freq: Once | INTRAVENOUS | Status: AC

## 2024-05-10 NOTE — Patient Instructions (Signed)

## 2024-05-10 NOTE — Progress Notes (Signed)
 Patient opted not to stay for the 30 minute post observation for Iron 

## 2024-05-16 ENCOUNTER — Inpatient Hospital Stay: Admitting: Family

## 2024-05-16 ENCOUNTER — Encounter: Payer: Self-pay | Admitting: Family

## 2024-05-16 ENCOUNTER — Inpatient Hospital Stay

## 2024-05-16 VITALS — BP 129/46 | HR 62 | Temp 98.3°F | Resp 18

## 2024-05-16 DIAGNOSIS — D508 Other iron deficiency anemias: Secondary | ICD-10-CM

## 2024-05-16 DIAGNOSIS — C541 Malignant neoplasm of endometrium: Secondary | ICD-10-CM | POA: Diagnosis not present

## 2024-05-16 DIAGNOSIS — D509 Iron deficiency anemia, unspecified: Secondary | ICD-10-CM | POA: Diagnosis not present

## 2024-05-16 DIAGNOSIS — C539 Malignant neoplasm of cervix uteri, unspecified: Secondary | ICD-10-CM

## 2024-05-16 LAB — CBC WITH DIFFERENTIAL (CANCER CENTER ONLY)
Abs Immature Granulocytes: 0.01 K/uL (ref 0.00–0.07)
Basophils Absolute: 0 K/uL (ref 0.0–0.1)
Basophils Relative: 1 %
Eosinophils Absolute: 0.1 K/uL (ref 0.0–0.5)
Eosinophils Relative: 1 %
HCT: 35.8 % — ABNORMAL LOW (ref 36.0–46.0)
Hemoglobin: 11.2 g/dL — ABNORMAL LOW (ref 12.0–15.0)
Immature Granulocytes: 0 %
Lymphocytes Relative: 15 %
Lymphs Abs: 0.6 K/uL — ABNORMAL LOW (ref 0.7–4.0)
MCH: 27.2 pg (ref 26.0–34.0)
MCHC: 31.3 g/dL (ref 30.0–36.0)
MCV: 86.9 fL (ref 80.0–100.0)
Monocytes Absolute: 0.3 K/uL (ref 0.1–1.0)
Monocytes Relative: 6 %
Neutro Abs: 3 K/uL (ref 1.7–7.7)
Neutrophils Relative %: 77 %
Platelet Count: 185 K/uL (ref 150–400)
RBC: 4.12 MIL/uL (ref 3.87–5.11)
RDW: 18 % — ABNORMAL HIGH (ref 11.5–15.5)
WBC Count: 3.9 K/uL — ABNORMAL LOW (ref 4.0–10.5)
nRBC: 0 % (ref 0.0–0.2)

## 2024-05-16 LAB — CMP (CANCER CENTER ONLY)
ALT: 14 U/L (ref 0–44)
AST: 23 U/L (ref 15–41)
Albumin: 4.3 g/dL (ref 3.5–5.0)
Alkaline Phosphatase: 77 U/L (ref 38–126)
Anion gap: 9 (ref 5–15)
BUN: 23 mg/dL (ref 8–23)
CO2: 30 mmol/L (ref 22–32)
Calcium: 9.2 mg/dL (ref 8.9–10.3)
Chloride: 100 mmol/L (ref 98–111)
Creatinine: 0.95 mg/dL (ref 0.44–1.00)
GFR, Estimated: 60 mL/min — ABNORMAL LOW (ref >60)
Glucose, Bld: 113 mg/dL — ABNORMAL HIGH (ref 70–99)
Potassium: 3.5 mmol/L (ref 3.5–5.1)
Sodium: 140 mmol/L (ref 135–145)
Total Bilirubin: 0.3 mg/dL (ref 0.0–1.2)
Total Protein: 6.8 g/dL (ref 6.5–8.1)

## 2024-05-16 LAB — RETICULOCYTES
Immature Retic Fract: 3.6 % (ref 2.3–15.9)
RBC.: 4.11 MIL/uL (ref 3.87–5.11)
Retic Count, Absolute: 19.7 K/uL (ref 19.0–186.0)
Retic Ct Pct: 0.5 % (ref 0.4–3.1)

## 2024-05-16 LAB — SAMPLE TO BLOOD BANK

## 2024-05-16 LAB — IRON AND IRON BINDING CAPACITY (CC-WL,HP ONLY)
Iron: 43 ug/dL (ref 28–170)
Saturation Ratios: 15 % (ref 10.4–31.8)
TIBC: 295 ug/dL (ref 250–450)
UIBC: 252 ug/dL

## 2024-05-16 LAB — FERRITIN: Ferritin: 500 ng/mL — ABNORMAL HIGH (ref 11–307)

## 2024-05-16 NOTE — Progress Notes (Signed)
 Hematology and Oncology Follow Up Visit  Shannon Scott [id number] [birth year] 83 y.o. 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 is here today for follow-up. She is doing well overall. She is happy to be back home living independently. She feels that her energy is improving.  PT comes out to her twice a week and a home health nurse once a week.  She has not noted any obvious blood loss since being home. Ostomy is working well.  No bruising or petechiae.  No fever, chills, n/v, cough, rash, dizziness, SOB, chest pain, abdominal pain or changes in bladder habits.  Lymphedema and cellulitis in the left leg appears to be improved.  Pedal pulses are 1+.  No falls or syncope reported.  Appetite and hydration are improved per patient.  ECOG Performance Status: 1 - Symptomatic but completely ambulatory  Medications:  Allergies as of 05/16/2024       Reactions   Statins Other (See Comments)   Myalgias and memory problems   Ciprofloxacin  Itching, Swelling, Other (See Comments)   Other reaction(s): swelling/redness at injection site        Medication List        Accurate as of May 16, 2024  3:46 PM. If you have any questions, ask your nurse or doctor.          amiodarone  200 MG tablet Commonly known as: PACERONE  Take 0.5 tablets (100 mg total) by mouth daily.   Eliquis  2.5 MG Tabs tablet Generic drug: apixaban  Take 1 tablet (2.5 mg total) by mouth 2 (two) times daily.   ezetimibe  10 MG tablet Commonly known as: ZETIA  Take 1 tablet (10 mg total) by mouth daily.   folic acid  1 MG tablet Commonly  known as: FOLVITE  Take 2 tablets (2 mg total) by mouth daily.   furosemide  20 MG tablet Commonly known as: LASIX  Take 1 tablet (20 mg total) by mouth daily.   levothyroxine  50 MCG tablet Commonly known as: SYNTHROID  Take 1 tablet (50 mcg total) by mouth daily.   mesalamine  4 g enema Commonly known as: ROWASA  Place 60 mLs (4 g total) rectally every 3 (three) days.   metoprolol  succinate 25 MG 24 hr tablet Commonly known as: TOPROL -XL Take 1/2 tablet (12.5 mg total) by mouth daily. Take with or immediately following a meal.   nitroGLYCERIN  0.4 MG SL tablet Commonly known as: NITROSTAT  Place 1 tablet (0.4 mg total) under the tongue every 5 (five) minutes as needed for chest pain.   One A Day Women 50 Plus Chew Chew 1 each by mouth daily at 6 PM. One-A-Day Women's 50+ Gummies;  Chew one gummy by mouth every evening at 1800.   pantoprazole  40 MG tablet Commonly known as: PROTONIX  Take 1 tablet (40 mg total) by mouth daily.   polyethylene glycol 17 g packet Commonly known as: MIRALAX  / GLYCOLAX  Take 17 g by mouth daily as needed for severe constipation. What changed: when to take this   Repatha  SureClick 140 MG/ML Soaj Generic drug: Evolocumab  Inject 140 mg into the skin every 14 (fourteen) days.   traMADol  50 MG tablet Commonly known as: ULTRAM  TAKE  2 TABLETS(100 MG) BY MOUTH TWICE DAILY   Vitamin B-12 5000 MCG Lozg Take 1 each by mouth in the morning.        Allergies: Allergies[1]  Past Medical History, Surgical history, Social history, and Family History were reviewed and updated.  Review of Systems: All other 10 point review of systems is negative.   Physical Exam:  oral temperature is 98.3 F (36.8 C). Her blood pressure is 129/46 (abnormal) and her pulse is 62. Her respiration is 18 and oxygen saturation is 98%.   Wt Readings from Last 3 Encounters:  03/20/24 119 lb (54 kg)  03/14/24 111 lb 8.8 oz (50.6 kg)  02/29/24 132 lb 3.2 oz (60 kg)    Ocular:  Sclerae unicteric, pupils equal, round and reactive to light Ear-nose-throat: Oropharynx clear, dentition fair Lymphatic: No cervical or supraclavicular adenopathy Lungs no rales or rhonchi, good excursion bilaterally Heart regular rate and rhythm, no murmur appreciated Abd soft, nontender, positive bowel sounds MSK no focal spinal tenderness, no joint edema Neuro: non-focal, well-oriented, appropriate affect Breasts: Deferred   Lab Results  Component Value Date   WBC 3.9 (L) 05/16/2024   HGB 11.2 (L) 05/16/2024   HCT 35.8 (L) 05/16/2024   MCV 86.9 05/16/2024   PLT 185 05/16/2024   Lab Results  Component Value Date   FERRITIN 43 04/06/2024   IRON  28 04/06/2024   TIBC 307 04/06/2024   UIBC 279 04/06/2024   IRONPCTSAT 9 (L) 04/06/2024   Lab Results  Component Value Date   RETICCTPCT 0.5 05/16/2024   RBC 4.11 05/16/2024   RBC 4.12 05/16/2024   No results found for: KPAFRELGTCHN, LAMBDASER, KAPLAMBRATIO No results found for: IGGSERUM, IGA, IGMSERUM No results found for: STEPHANY CARLOTA BENSON MARKEL EARLA JOANNIE DOC VICK, SPEI   Chemistry      Component Value Date/Time   NA 138 04/06/2024 1500   NA 136 03/20/2024 1603   K 4.3 04/06/2024 1500   CL 104 04/06/2024 1500   CO2 26 04/06/2024 1500   BUN 20 04/06/2024 1500   BUN 22 03/20/2024 1603   CREATININE 0.80 04/06/2024 1500   CREATININE 0.69 08/02/2016 1519   GLU 111 09/08/2017 0000      Component Value Date/Time   CALCIUM 9.3 04/06/2024 1500   ALKPHOS 74 04/06/2024 1500   AST 20 04/06/2024 1500   ALT 13 04/06/2024 1500   BILITOT 0.2 04/06/2024 1500       Impression and Plan:  [name] is a very pleasant 83 yo caucasian female with recurrent undifferentiated endometrial carcinoma. She has Lynch syndrome.   She was on immunotherapy. She tolerated this nicely and in worked well. This has been on hold per pt request.  No ESA given, Hgb 11.2.  We will get CT scan  of chest, abdomen and pelvis in 3 weeks.  MD follow-up in 1 month.   Lauraine Pepper, NP 12/17/20253:46 PM     [1]  Allergies Allergen Reactions   Statins Other (See Comments)    Myalgias and memory problems   Ciprofloxacin  Itching, Swelling and Other (See Comments)    Other reaction(s): swelling/redness at injection site

## 2024-05-16 NOTE — Patient Instructions (Signed)
 Implanted [facility] Guide An implanted port is a device that is placed under the skin. It is usually placed in the chest. The device may vary based on the need. Implanted ports can be used to give IV medicine, to take blood, or to give fluids. You may have an implanted port if: You need IV medicine that would be irritating to the small veins in your hands or arms. You need IV medicines, such as chemotherapy, for a long period of time. You need IV nutrition for a long period of time. You may have fewer limitations when using a port than you would if you used other types of long-term IVs. You will also likely be able to return to normal activities after your incision heals. An implanted port has two main parts: Reservoir. The reservoir is the part where a needle is inserted to give medicines or draw blood. The reservoir is round. After the port is placed, it appears as a small, raised area under your skin. Catheter. The catheter is a small, thin tube that connects the reservoir to a vein. Medicine that is inserted into the reservoir goes into the catheter and then into the vein. How is my port accessed? To access your port: A numbing cream may be placed on the skin over the port site. Your health care provider will put on a mask and sterile gloves. The skin over your port will be cleaned carefully with a germ-killing soap and allowed to dry. Your health care provider will gently pinch the port and insert a needle into it. Your health care provider will check for a blood return to make sure the port is in the vein and is still working (patent). If your port needs to remain accessed to get medicine continuously (constant infusion), your health care provider will place a clear bandage (dressing) over the needle site. The dressing and needle will need to be changed every week, or as told by your health care provider. What is flushing? Flushing helps keep the port working. Follow instructions from your  health care provider about how and when to flush the port. Ports are usually flushed with saline solution or a medicine called heparin . The need for flushing will depend on how the port is used: If the port is only used from time to time to give medicines or draw blood, the port may need to be flushed: Before and after medicines have been given. Before and after blood has been drawn. As part of routine maintenance. Flushing may be recommended every 4-6 weeks. If a constant infusion is running, the port may not need to be flushed. Throw away any syringes in a disposal container that is meant for sharp items (sharps container). You can buy a sharps container from a pharmacy, or you can make one by using an empty hard plastic bottle with a cover. How long will my port stay implanted? The port can stay in for as long as your health care provider thinks it is needed. When it is time for the port to come out, a surgery will be done to remove it. The surgery will be similar to the procedure that was done to put the port in. Follow these instructions at home: Caring for your port and port site Flush your port as told by your health care provider. If you need an infusion over several days, follow instructions from your health care provider about how to take care of your port site. Make sure you: Change your  dressing as told by your health care provider. Wash your hands with soap and water for at least 20 seconds before and after you change your dressing. If soap and water are not available, use alcohol-based hand sanitizer. Place any used dressings or infusion bags into a plastic bag. Throw that bag in the trash. Keep the dressing that covers the needle clean and dry. Do not get it wet. Do not use scissors or sharp objects near the infusion tubing. Keep any external tubes clamped, unless they are being used. Check your port site every day for signs of infection. Check for: Redness, swelling, or  pain. Fluid or blood. Warmth. Pus or a bad smell. Protect the skin around the port site. Avoid wearing bra straps that rub or irritate the site. Protect the skin around your port from seat belts. Place a soft pad over your chest if needed. Bathe or shower as told by your health care provider. The site may get wet as long as you are not actively receiving an infusion. General instructions  Return to your normal activities as told by your health care provider. Ask your health care provider what activities are safe for you. Carry a medical alert card or wear a medical alert bracelet at all times. This will let health care providers know that you have an implanted port in case of an emergency. Where to find more information American Cancer Society: www.cancer.org American Society of Clinical Oncology: www.cancer.net Contact a health care provider if: You have a fever or chills. You have redness, swelling, or pain at the port site. You have fluid or blood coming from your port site. Your incision feels warm to the touch. You have pus or a bad smell coming from the port site. Summary Implanted ports are usually placed in the chest for long-term IV access. Follow instructions from your health care provider about flushing the port and changing bandages (dressings). Take care of the area around your port by avoiding clothing that puts pressure on the area, and by watching for signs of infection. Protect the skin around your port from seat belts. Place a soft pad over your chest if needed. Contact a health care provider if you have a fever or you have redness, swelling, pain, fluid, or a bad smell at the port site. This information is not intended to replace advice given to you by your health care provider. Make sure you discuss any questions you have with your health care provider. Document Revised: 11/18/2020 Document Reviewed: 11/18/2020 Elsevier Patient Education  2024 Elsevier  Inc.Darbepoetin Alfa  Injection What is this medication? DARBEPOETIN ALFA  (dar be POE e tin AL fa) treats low levels of red blood cells (anemia) caused by kidney disease or chemotherapy. It works by Systems analyst make more red blood cells, which reduces the need for blood transfusions. This medicine may be used for other purposes; ask your health care provider or pharmacist if you have questions. COMMON BRAND NAME(S): Aranesp  What should I tell my care team before I take this medication? They need to know if you have any of these conditions: Blood clots Cancer Heart disease High blood pressure On dialysis Seizures Stroke An unusual or allergic reaction to darbepoetin, latex, other medications, foods, dyes, or preservatives Pregnant or trying to get pregnant Breast-feeding How should I use this medication? This medication is injected into a vein or under the skin. It is usually given by a care team in a hospital or clinic setting. It may also be  given at home. If you get this medication at home, you will be taught how to prepare and give it. Use exactly as directed. Take it as directed on the prescription label at the same time every day. Keep taking it unless your care team tells you to stop. It is important that you put your used needles and syringes in a special sharps container. Do not put them in a trash can. If you do not have a sharps container, call your pharmacist or care team to get one. A special MedGuide will be given to you by the pharmacist with each prescription and refill. Be sure to read this information carefully each time. Talk to your care team about the use of this medication in children. While this medication may be used in children as young as 1 month of age for selected conditions, precautions do apply. Overdosage: If you think you have taken too much of this medicine contact a poison control center or emergency room at once. NOTE: This medicine is only for you. Do  not share this medicine with others. What if I miss a dose? If you miss a dose, take it as soon as you can. If it is almost time for your next dose, take only that dose. Do not take double or extra doses. What may interact with this medication? Epoetin alfa Methoxy polyethylene glycol-epoetin beta This list may not describe all possible interactions. Give your health care provider a list of all the medicines, herbs, non-prescription drugs, or dietary supplements you use. Also tell them if you smoke, drink alcohol, or use illegal drugs. Some items may interact with your medicine. What should I watch for while using this medication? Visit your care team for regular checks on your progress. Check your blood pressure as directed. Know what your blood pressure should be and when to contact your care team. Your condition will be monitored carefully while you are receiving this medication. You may need blood work while taking this medication. What side effects may I notice from receiving this medication? Side effects that you should report to your care team as soon as possible: Allergic reactions--skin rash, itching, hives, swelling of the face, lips, tongue, or throat Blood clot--pain, swelling, or warmth in the leg, shortness of breath, chest pain Heart attack--pain or tightness in the chest, shoulders, arms, or jaw, nausea, shortness of breath, cold or clammy skin, feeling faint or lightheaded Increase in blood pressure Rash, fever, and swollen lymph nodes Redness, blistering, peeling, or loosening of the skin, including inside the mouth Seizures Stroke--sudden numbness or weakness of the face, arm, or leg, trouble speaking, confusion, trouble walking, loss of balance or coordination, dizziness, severe headache, change in vision Side effects that usually do not require medical attention (report to your care team if they continue or are bothersome): Cough Stomach pain Swelling of the ankles, hands,  or feet This list may not describe all possible side effects. Call your doctor for medical advice about side effects. You may report side effects to FDA at 1-800-FDA-1088. Where should I keep my medication? Keep out of the reach of children and pets. Store in a refrigerator. Do not freeze. Do not shake. Protect from light. Keep this medication in the original container until you are ready to take it. See product for storage information. Get rid of any unused medication after the expiration date. To get rid of medications that are no longer needed or have expired: Take the medication to a medication take-back program.  Check with your pharmacy or law enforcement to find a location. If you cannot return the medication, ask your pharmacist or care team how to get rid of the medication safely. NOTE: This sheet is a summary. It may not cover all possible information. If you have questions about this medicine, talk to your doctor, pharmacist, or health care provider.  2024 Elsevier/Gold Standard (2021-09-16 00:00:00)

## 2024-05-17 LAB — CA 125: Cancer Antigen (CA) 125: 17.7 U/mL (ref 0.0–38.1)

## 2024-05-21 ENCOUNTER — Other Ambulatory Visit: Payer: Self-pay

## 2024-05-23 ENCOUNTER — Other Ambulatory Visit: Payer: Self-pay

## 2024-05-23 ENCOUNTER — Emergency Department (HOSPITAL_COMMUNITY)

## 2024-05-23 ENCOUNTER — Emergency Department (HOSPITAL_COMMUNITY)
Admission: EM | Admit: 2024-05-23 | Discharge: 2024-05-23 | Disposition: A | Discharge status: Home / Self Care | Attending: Emergency Medicine | Admitting: Emergency Medicine

## 2024-05-23 DIAGNOSIS — I4891 Unspecified atrial fibrillation: Secondary | ICD-10-CM | POA: Diagnosis not present

## 2024-05-23 DIAGNOSIS — S12691A Other nondisplaced fracture of seventh cervical vertebra, initial encounter for closed fracture: Secondary | ICD-10-CM | POA: Insufficient documentation

## 2024-05-23 DIAGNOSIS — I10 Essential (primary) hypertension: Secondary | ICD-10-CM | POA: Diagnosis not present

## 2024-05-23 DIAGNOSIS — Z79899 Other long term (current) drug therapy: Secondary | ICD-10-CM | POA: Diagnosis not present

## 2024-05-23 DIAGNOSIS — Z7901 Long term (current) use of anticoagulants: Secondary | ICD-10-CM | POA: Diagnosis not present

## 2024-05-23 DIAGNOSIS — W130XXA Fall from, out of or through balcony, initial encounter: Secondary | ICD-10-CM | POA: Diagnosis not present

## 2024-05-23 DIAGNOSIS — S22060A Wedge compression fracture of T7-T8 vertebra, initial encounter for closed fracture: Secondary | ICD-10-CM | POA: Diagnosis not present

## 2024-05-23 DIAGNOSIS — S0003XA Contusion of scalp, initial encounter: Secondary | ICD-10-CM | POA: Diagnosis not present

## 2024-05-23 DIAGNOSIS — M542 Cervicalgia: Secondary | ICD-10-CM | POA: Diagnosis present

## 2024-05-23 DIAGNOSIS — I2510 Atherosclerotic heart disease of native coronary artery without angina pectoris: Secondary | ICD-10-CM | POA: Diagnosis not present

## 2024-05-23 DIAGNOSIS — S12601A Unspecified nondisplaced fracture of seventh cervical vertebra, initial encounter for closed fracture: Secondary | ICD-10-CM

## 2024-05-23 DIAGNOSIS — C541 Malignant neoplasm of endometrium: Secondary | ICD-10-CM | POA: Insufficient documentation

## 2024-05-23 LAB — CBC WITH DIFFERENTIAL/PLATELET
Abs Immature Granulocytes: 0.05 K/uL (ref 0.00–0.07)
Basophils Absolute: 0 K/uL (ref 0.0–0.1)
Basophils Relative: 1 %
Eosinophils Absolute: 0 K/uL (ref 0.0–0.5)
Eosinophils Relative: 1 %
HCT: 39.4 % (ref 36.0–46.0)
Hemoglobin: 12.6 g/dL (ref 12.0–15.0)
Immature Granulocytes: 1 %
Lymphocytes Relative: 7 %
Lymphs Abs: 0.4 K/uL — ABNORMAL LOW (ref 0.7–4.0)
MCH: 27.8 pg (ref 26.0–34.0)
MCHC: 32 g/dL (ref 30.0–36.0)
MCV: 86.8 fL (ref 80.0–100.0)
Monocytes Absolute: 0.2 K/uL (ref 0.1–1.0)
Monocytes Relative: 4 %
Neutro Abs: 5.6 K/uL (ref 1.7–7.7)
Neutrophils Relative %: 86 %
Platelets: 182 K/uL (ref 150–400)
RBC: 4.54 MIL/uL (ref 3.87–5.11)
RDW: 18.3 % — ABNORMAL HIGH (ref 11.5–15.5)
WBC: 6.4 K/uL (ref 4.0–10.5)
nRBC: 0 % (ref 0.0–0.2)

## 2024-05-23 LAB — COMPREHENSIVE METABOLIC PANEL WITH GFR
ALT: 26 U/L (ref 0–44)
AST: 39 U/L (ref 15–41)
Albumin: 4.5 g/dL (ref 3.5–5.0)
Alkaline Phosphatase: 100 U/L (ref 38–126)
Anion gap: 12 (ref 5–15)
BUN: 23 mg/dL (ref 8–23)
CO2: 29 mmol/L (ref 22–32)
Calcium: 9.8 mg/dL (ref 8.9–10.3)
Chloride: 97 mmol/L — ABNORMAL LOW (ref 98–111)
Creatinine, Ser: 1.02 mg/dL — ABNORMAL HIGH (ref 0.44–1.00)
GFR, Estimated: 55 mL/min — ABNORMAL LOW
Glucose, Bld: 110 mg/dL — ABNORMAL HIGH (ref 70–99)
Potassium: 3.6 mmol/L (ref 3.5–5.1)
Sodium: 138 mmol/L (ref 135–145)
Total Bilirubin: 0.5 mg/dL (ref 0.0–1.2)
Total Protein: 7.7 g/dL (ref 6.5–8.1)

## 2024-05-23 LAB — I-STAT CHEM 8, ED
BUN: 24 mg/dL — ABNORMAL HIGH (ref 8–23)
Calcium, Ion: 1.19 mmol/L (ref 1.15–1.40)
Chloride: 97 mmol/L — ABNORMAL LOW (ref 98–111)
Creatinine, Ser: 1.1 mg/dL — ABNORMAL HIGH (ref 0.44–1.00)
Glucose, Bld: 109 mg/dL — ABNORMAL HIGH (ref 70–99)
HCT: 40 % (ref 36.0–46.0)
Hemoglobin: 13.6 g/dL (ref 12.0–15.0)
Potassium: 3.4 mmol/L — ABNORMAL LOW (ref 3.5–5.1)
Sodium: 138 mmol/L (ref 135–145)
TCO2: 27 mmol/L (ref 22–32)

## 2024-05-23 MED ORDER — ONDANSETRON HCL 4 MG/2ML IJ SOLN
4.0000 mg | Freq: Once | INTRAMUSCULAR | Status: AC
  Administered 2024-05-23: 4 mg via INTRAVENOUS
  Filled 2024-05-23: qty 2

## 2024-05-23 MED ORDER — OXYCODONE-ACETAMINOPHEN 5-325 MG PO TABS
2.0000 | ORAL_TABLET | Freq: Once | ORAL | Status: AC
  Administered 2024-05-23: 2 via ORAL
  Filled 2024-05-23: qty 2

## 2024-05-23 MED ORDER — OXYCODONE-ACETAMINOPHEN 5-325 MG PO TABS: 1.0000 | ORAL_TABLET | ORAL | 0 refills | Status: AC | PRN

## 2024-05-23 MED ORDER — IOHEXOL 350 MG/ML SOLN
75.0000 mL | Freq: Once | INTRAVENOUS | Status: AC | PRN
  Administered 2024-05-23: 75 mL via INTRAVENOUS

## 2024-05-23 MED ORDER — MORPHINE SULFATE (PF) 4 MG/ML IV SOLN
4.0000 mg | Freq: Once | INTRAVENOUS | Status: AC
  Administered 2024-05-23: 4 mg via INTRAVENOUS
  Filled 2024-05-23: qty 1

## 2024-05-23 NOTE — Discharge Instructions (Signed)
 As we discussed you have C7 fracture and T8 fractures  I recommend you wear c-collar during the day and also TLSO brace during the day.  You may remove them when you sleep  I have prescribed Percocet as needed for pain  You need to call the neurosurgery office for follow-up in 2 to 4 weeks   Return to ER if you have another fall or worsening back pain or neck pain

## 2024-05-23 NOTE — Progress Notes (Signed)
 Orthopedic Tech Progress Note Patient Details:  Shannon Scott Jun 29, 1940 [id number] Soft collar left at bedside to be used after f/u with ortho to ensure it is safe to use.  Ortho Devices Type of Ortho Device: Thoracolumbar corset (TLSO), Soft collar Ortho Device/Splint Location: C SPINE & T SPINE Ortho Device/Splint Interventions: Ordered, Application, Adjustment   Post Interventions Patient Tolerated: Well Instructions Provided: Care of device  [name] [name] 05/23/2024, 10:34 PM

## 2024-05-23 NOTE — ED Provider Notes (Signed)
 "  EMERGENCY DEPARTMENT AT [facility] Provider Note   CSN: [id number] Arrival date & time: 05/23/24  1612     Patient presents with: No chief complaint on file.   Shannon Scott is a 83 y.o. female history of CAD, hypertension, A-fib on Eliquis  here presenting with fall.  Patient states that she fell off the porch and tumbled and hit her head and also hit her back.  Patient states that she has neck pain and back pain.  She did take a dose of her Tylenol  3 and start the felt better.  Patient denies passing out.   The history is provided by the patient.       Prior to Admission medications  Medication Sig Start Date End Date Taking? Authorizing Provider  amiodarone  (PACERONE ) 200 MG tablet Take 0.5 tablets (100 mg total) by mouth daily. 03/20/24   Rana Lum CROME, NP  apixaban  (ELIQUIS ) 2.5 MG TABS tablet Take 1 tablet (2.5 mg total) by mouth 2 (two) times daily. 04/04/24   Rana Lum CROME, NP  Cyanocobalamin  (VITAMIN B-12) 5000 MCG LOZG Take 1 each by mouth in the morning.    [provider]  Evolocumab  (REPATHA  SURECLICK) 140 MG/ML SOAJ Inject 140 mg into the skin every 14 (fourteen) days. 04/06/24   Hilty, Vinie BROCKS, MD  ezetimibe  (ZETIA ) 10 MG tablet Take 1 tablet (10 mg total) by mouth daily. 03/20/24   Rana Lum CROME, NP  folic acid  (FOLVITE ) 1 MG tablet Take 2 tablets (2 mg total) by mouth daily. 04/04/24   Rana Lum CROME, NP  furosemide  (LASIX ) 20 MG tablet Take 1 tablet (20 mg total) by mouth daily. 03/20/24   Rana Lum CROME, NP  levothyroxine  (SYNTHROID ) 50 MCG tablet Take 1 tablet (50 mcg total) by mouth daily. 06/08/23     mesalamine  (ROWASA ) 4 g enema Place 60 mLs (4 g total) rectally every 3 (three) days. 03/14/24   Arlice Reichert, MD  metoprolol  succinate (TOPROL -XL) 25 MG 24 hr tablet Take 1/2 tablet (12.5 mg total) by mouth daily. Take with or immediately following a meal. 03/20/24   Fountain, Lum CROME, NP  Multiple  Vitamins-Minerals (ONE A DAY WOMEN 50 PLUS) CHEW Chew 1 each by mouth daily at 6 PM. One-A-Day Women's 50+ Gummies;  Chew one gummy by mouth every evening at 1800.    [provider]  nitroGLYCERIN  (NITROSTAT ) 0.4 MG SL tablet Place 1 tablet (0.4 mg total) under the tongue every 5 (five) minutes as needed for chest pain. 03/04/23   Cheryle Page, MD  pantoprazole  (PROTONIX ) 40 MG tablet Take 1 tablet (40 mg total) by mouth daily. 04/24/24   Rana Lum CROME, NP  polyethylene glycol (MIRALAX  / GLYCOLAX ) 17 g packet Take 17 g by mouth daily as needed for severe constipation. Patient taking differently: Take 17 g by mouth daily. 03/29/20   Amin, Ankit C, MD  traMADol  (ULTRAM ) 50 MG tablet TAKE 2 TABLETS(100 MG) BY MOUTH TWICE DAILY 04/09/24   Timmy Maude JONELLE, MD    Allergies: Statins and Ciprofloxacin     Review of Systems  Musculoskeletal:  Positive for back pain and neck pain.  Neurological:  Positive for headaches.  All other systems reviewed and are negative.   Updated Vital Signs BP (!) 181/81   Pulse 83   Temp 98.3 F (36.8 C) (Oral)   Resp 14   Ht 5' 1 (1.549 m)   Wt 57.6 kg   SpO2 97%   BMI 24.00  kg/m   Physical Exam Vitals and nursing note reviewed.  Constitutional:      Comments: Uncomfortable and chronically ill  HENT:     Head:     Comments: Posterior scalp hematoma with no obvious laceration    Nose: Nose normal.     Mouth/Throat:     Mouth: Mucous membranes are moist.  Eyes:     Extraocular Movements: Extraocular movements intact.     Pupils: Pupils are equal, round, and reactive to light.  Neck:     Comments: Mild right para cervical tenderness Cardiovascular:     Rate and Rhythm: Normal rate.     Pulses: Normal pulses.     Heart sounds: Normal heart sounds.  Pulmonary:     Effort: Pulmonary effort is normal.     Breath sounds: Normal breath sounds.  Abdominal:     General: Abdomen is flat.     Palpations: Abdomen is soft.   Musculoskeletal:     Cervical back: Normal range of motion.     Comments: Diffuse thoracic and lumbar tenderness with no obvious step-off.  Patient has some lymphedema in the left leg with some venous stasis changes.  Skin:    Capillary Refill: Capillary refill takes less than 2 seconds.  Neurological:     General: No focal deficit present.  Psychiatric:        Mood and Affect: Mood normal.     (all labs ordered are listed, but only abnormal results are displayed) Labs Reviewed  CBC WITH DIFFERENTIAL/PLATELET - Abnormal; Notable for the following components:      Result Value   RDW 18.3 (*)    Lymphs Abs 0.4 (*)    All other components within normal limits  COMPREHENSIVE METABOLIC PANEL WITH GFR - Abnormal; Notable for the following components:   Chloride 97 (*)    Glucose, Bld 110 (*)    Creatinine, Ser 1.02 (*)    GFR, Estimated 55 (*)    All other components within normal limits  I-STAT CHEM 8, ED - Abnormal; Notable for the following components:   Potassium 3.4 (*)    Chloride 97 (*)    BUN 24 (*)    Creatinine, Ser 1.10 (*)    Glucose, Bld 109 (*)    All other components within normal limits    EKG: None  Radiology: DG Pelvis Portable Result Date: 05/23/2024 EXAM: 1 or 2 VIEW(S) XRAY OF THE PELVIS 05/23/2024 04:59:00 PM COMPARISON: None available. CLINICAL HISTORY: fall FINDINGS: BONES AND JOINTS: Old healed right parasympathetic pubic bone fracture. Degenerative changes of the visualized lower lumbar spine. Degenerative changes of the SI joints. SOFT TISSUES: The soft tissues are unremarkable. VASCULATURE: IVC filter partially visualized. Vascular calcification. IMPRESSION: 1. No acute fracture or dislocation. . Electronically signed by: Rogelia Myers MD 05/23/2024 05:27 PM EST RP Workstation: [id number]   DG Chest Port 1 View Result Date: 05/23/2024 EXAM: 1 VIEW(S) XRAY OF THE CHEST 05/23/2024 04:59:00 PM COMPARISON: 03/10/2024 CLINICAL HISTORY: fall FINDINGS:  LINES, TUBES AND DEVICES: Right chest Port-A-Cath in place with tip overlying the superior cavoatrial junction. Partially visualized IVC filter. LUNGS AND PLEURA: No focal pulmonary opacity. Trace bilateral pleural effusions, decreased from prior exam. No pneumothorax. HEART AND MEDIASTINUM: Mild cardiomegaly. Tortuous aorta with aortic arch atherosclerosis. CABG markers noted. BONES AND SOFT TISSUES: Sternotomy wires noted. No acute osseous abnormality. IMPRESSION: 1. Cardiomegaly. Otherwise, no acute cardiopulmonary abnormality. Electronically signed by: Rogelia Myers MD 05/23/2024 05:17 PM EST RP Workstation: [id number]  Procedures   Medications Ordered in the ED  morphine  (PF) 4 MG/ML injection 4 mg (has no administration in time range)  ondansetron  (ZOFRAN ) injection 4 mg (has no administration in time range)  iohexol  (OMNIPAQUE ) 350 MG/ML injection 75 mL (75 mLs Intravenous Contrast Given 05/23/24 1731)                                    Medical Decision Making TALEIGH GERO is a 83 y.o. female here presenting with fall.  Level 2 trauma activated since she is on Eliquis  and had head injury.  She has diffuse spinal tenderness.  Plan to get CT head and cervical spine and CT chest abdomen pelvis with recon of the thoracic and lumbar spine.  Will give pain medicine and reassess.  7:15 PM I reviewed patient's labs and they were unremarkable.  CT head is unremarkable CT cervical spine showed new C7 compression fracture.  CT chest abdomen pelvis unremarkable but patient does have a T8 compression fracture.  Discussed with neurosurgery PA, Johnanna, who recommend TLSO brace and cervical collar and outpatient follow-up.  Pain is under control now.  Stable for discharge on Percocet as needed   Problems Addressed: Closed nondisplaced fracture of seventh cervical vertebra, unspecified fracture morphology, initial encounter [facility]): acute illness or injury Closed wedge compression fracture of  T8 vertebra, initial encounter [facility]): acute illness or injury  Amount and/or Complexity of Data Reviewed Labs: ordered. Decision-making details documented in ED Course. Radiology: ordered and independent interpretation performed. Decision-making details documented in ED Course.  Risk Prescription drug management.     Final diagnoses:  Endometrial cancer [facility])    ED Discharge Orders     None          Patt Alm Macho, MD 05/23/24 1918  "

## 2024-05-23 NOTE — ED Triage Notes (Addendum)
 Pt fell off of porch approx 4 ft and hit her head on the ground. Pt denies LOC. Pt is on eliquis . Pt states she did hit her head on the ground. Pt c/o neck and back pain. There are no obvious head injuries

## 2024-05-28 ENCOUNTER — Other Ambulatory Visit: Payer: Self-pay

## 2024-05-28 ENCOUNTER — Other Ambulatory Visit (HOSPITAL_COMMUNITY): Payer: Self-pay

## 2024-05-30 ENCOUNTER — Other Ambulatory Visit: Payer: Self-pay | Admitting: Hematology & Oncology

## 2024-05-30 DIAGNOSIS — G8929 Other chronic pain: Secondary | ICD-10-CM

## 2024-05-30 DIAGNOSIS — M792 Neuralgia and neuritis, unspecified: Secondary | ICD-10-CM

## 2024-05-30 DIAGNOSIS — M545 Low back pain, unspecified: Secondary | ICD-10-CM

## 2024-05-30 DIAGNOSIS — C541 Malignant neoplasm of endometrium: Secondary | ICD-10-CM

## 2024-06-05 ENCOUNTER — Other Ambulatory Visit (HOSPITAL_COMMUNITY): Payer: Self-pay

## 2024-06-06 ENCOUNTER — Ambulatory Visit: Admitting: Internal Medicine

## 2024-06-06 ENCOUNTER — Other Ambulatory Visit: Payer: Self-pay

## 2024-06-06 ENCOUNTER — Other Ambulatory Visit (HOSPITAL_COMMUNITY): Payer: Self-pay

## 2024-06-06 ENCOUNTER — Encounter: Payer: Self-pay | Admitting: Pharmacist

## 2024-06-06 ENCOUNTER — Ambulatory Visit: Admitting: Emergency Medicine

## 2024-06-07 ENCOUNTER — Other Ambulatory Visit: Payer: Self-pay

## 2024-06-08 ENCOUNTER — Other Ambulatory Visit (HOSPITAL_COMMUNITY): Payer: Self-pay

## 2024-06-21 ENCOUNTER — Inpatient Hospital Stay

## 2024-06-21 ENCOUNTER — Inpatient Hospital Stay: Admitting: Hematology & Oncology

## 2024-06-28 ENCOUNTER — Other Ambulatory Visit: Payer: Self-pay

## 2024-06-28 ENCOUNTER — Other Ambulatory Visit (HOSPITAL_COMMUNITY): Payer: Self-pay

## 2024-07-04 ENCOUNTER — Other Ambulatory Visit: Payer: Self-pay

## 2024-07-05 ENCOUNTER — Inpatient Hospital Stay

## 2024-07-05 ENCOUNTER — Other Ambulatory Visit (HOSPITAL_COMMUNITY): Payer: Self-pay

## 2024-07-05 ENCOUNTER — Inpatient Hospital Stay: Admitting: Hematology & Oncology

## 2024-07-18 ENCOUNTER — Inpatient Hospital Stay: Admitting: Hematology & Oncology

## 2024-07-18 ENCOUNTER — Inpatient Hospital Stay
